# Patient Record
Sex: Male | Born: 1960
Health system: Southern US, Community
[De-identification: ages and names within clinical notes are randomized; demographics above are authoritative.]

## PROBLEM LIST (undated history)

## (undated) DIAGNOSIS — G8929 Other chronic pain: Secondary | ICD-10-CM

## (undated) DIAGNOSIS — I82409 Acute embolism and thrombosis of unspecified deep veins of unspecified lower extremity: Secondary | ICD-10-CM

## (undated) DIAGNOSIS — R109 Unspecified abdominal pain: Secondary | ICD-10-CM

## (undated) DIAGNOSIS — F329 Major depressive disorder, single episode, unspecified: Secondary | ICD-10-CM

## (undated) DIAGNOSIS — G4700 Insomnia, unspecified: Secondary | ICD-10-CM

## (undated) DIAGNOSIS — L02415 Cutaneous abscess of right lower limb: Secondary | ICD-10-CM

## (undated) DIAGNOSIS — K219 Gastro-esophageal reflux disease without esophagitis: Secondary | ICD-10-CM

## (undated) DIAGNOSIS — R7401 Elevation of levels of liver transaminase levels: Secondary | ICD-10-CM

## (undated) DIAGNOSIS — R269 Unspecified abnormalities of gait and mobility: Secondary | ICD-10-CM

## (undated) DIAGNOSIS — G35 Multiple sclerosis: Secondary | ICD-10-CM

## (undated) DIAGNOSIS — K8020 Calculus of gallbladder without cholecystitis without obstruction: Secondary | ICD-10-CM

## (undated) DIAGNOSIS — G44009 Cluster headache syndrome, unspecified, not intractable: Secondary | ICD-10-CM

## (undated) DIAGNOSIS — M1990 Unspecified osteoarthritis, unspecified site: Secondary | ICD-10-CM

## (undated) DIAGNOSIS — G894 Chronic pain syndrome: Secondary | ICD-10-CM

## (undated) DIAGNOSIS — M549 Dorsalgia, unspecified: Secondary | ICD-10-CM

## (undated) DIAGNOSIS — H538 Other visual disturbances: Secondary | ICD-10-CM

## (undated) DIAGNOSIS — I10 Essential (primary) hypertension: Secondary | ICD-10-CM

## (undated) DIAGNOSIS — F32A Depression, unspecified: Secondary | ICD-10-CM

## (undated) DIAGNOSIS — R740 Nonspecific elevation of levels of transaminase and lactic acid dehydrogenase [LDH]: Secondary | ICD-10-CM

## (undated) DIAGNOSIS — J189 Pneumonia, unspecified organism: Secondary | ICD-10-CM

## (undated) DIAGNOSIS — R51 Headache: Secondary | ICD-10-CM

## (undated) DIAGNOSIS — M531 Cervicobrachial syndrome: Secondary | ICD-10-CM

## (undated) DIAGNOSIS — L03115 Cellulitis of right lower limb: Secondary | ICD-10-CM

## (undated) DIAGNOSIS — H8110 Benign paroxysmal vertigo, unspecified ear: Secondary | ICD-10-CM

## (undated) DIAGNOSIS — M2550 Pain in unspecified joint: Secondary | ICD-10-CM

## (undated) DIAGNOSIS — R7402 Elevation of levels of lactic acid dehydrogenase (LDH): Secondary | ICD-10-CM

## (undated) DIAGNOSIS — Z9889 Other specified postprocedural states: Secondary | ICD-10-CM

## (undated) DIAGNOSIS — G500 Trigeminal neuralgia: Secondary | ICD-10-CM

## (undated) DIAGNOSIS — R413 Other amnesia: Secondary | ICD-10-CM

## (undated) DIAGNOSIS — M2540 Effusion, unspecified joint: Secondary | ICD-10-CM

## (undated) DIAGNOSIS — F419 Anxiety disorder, unspecified: Secondary | ICD-10-CM

## (undated) HISTORY — DX: Other amnesia: R41.3

## (undated) HISTORY — DX: Multiple sclerosis: G35

## (undated) HISTORY — DX: Unspecified osteoarthritis, unspecified site: M19.90

## (undated) HISTORY — DX: Other visual disturbances: H53.8

## (undated) HISTORY — DX: Anxiety disorder, unspecified: F41.9

## (undated) HISTORY — DX: Cervicobrachial syndrome: M53.1

## (undated) HISTORY — DX: Headache: R51

## (undated) HISTORY — DX: Benign paroxysmal vertigo, unspecified ear: H81.10

## (undated) HISTORY — DX: Insomnia, unspecified: G47.00

## (undated) HISTORY — DX: Essential (primary) hypertension: I10

## (undated) HISTORY — DX: Elevation of levels of liver transaminase levels: R74.01

## (undated) HISTORY — DX: Trigeminal neuralgia: G50.0

## (undated) HISTORY — DX: Major depressive disorder, single episode, unspecified: F32.9

## (undated) HISTORY — DX: Calculus of gallbladder without cholecystitis without obstruction: K80.20

## (undated) HISTORY — DX: Elevation of levels of lactic acid dehydrogenase (LDH): R74.02

## (undated) HISTORY — DX: Chronic pain syndrome: G89.4

## (undated) HISTORY — DX: Unspecified abnormalities of gait and mobility: R26.9

## (undated) HISTORY — DX: Depression, unspecified: F32.A

## (undated) HISTORY — DX: Unspecified abdominal pain: R10.9

## (undated) HISTORY — DX: Nonspecific elevation of levels of transaminase and lactic acid dehydrogenase (ldh): R74.0

---

## 1998-05-02 ENCOUNTER — Emergency Department (HOSPITAL_COMMUNITY): Admission: EM | Admit: 1998-05-02 | Discharge: 1998-05-02 | Payer: Self-pay | Admitting: Emergency Medicine

## 2000-07-29 ENCOUNTER — Emergency Department (HOSPITAL_COMMUNITY): Admission: EM | Admit: 2000-07-29 | Discharge: 2000-07-29 | Payer: Self-pay | Admitting: Neurology

## 2000-08-31 ENCOUNTER — Ambulatory Visit (HOSPITAL_BASED_OUTPATIENT_CLINIC_OR_DEPARTMENT_OTHER): Admission: RE | Admit: 2000-08-31 | Discharge: 2000-08-31 | Payer: Self-pay | Admitting: Orthopedic Surgery

## 2002-05-21 ENCOUNTER — Emergency Department (HOSPITAL_COMMUNITY): Admission: EM | Admit: 2002-05-21 | Discharge: 2002-05-21 | Payer: Self-pay | Admitting: Emergency Medicine

## 2003-08-17 ENCOUNTER — Emergency Department (HOSPITAL_COMMUNITY): Admission: EM | Admit: 2003-08-17 | Discharge: 2003-08-17 | Payer: Self-pay | Admitting: Emergency Medicine

## 2004-01-19 ENCOUNTER — Ambulatory Visit (HOSPITAL_COMMUNITY): Admission: RE | Admit: 2004-01-19 | Discharge: 2004-01-19 | Payer: Self-pay | Admitting: Neurology

## 2004-02-20 ENCOUNTER — Ambulatory Visit (HOSPITAL_COMMUNITY): Admission: RE | Admit: 2004-02-20 | Discharge: 2004-02-20 | Payer: Self-pay | Admitting: Neurology

## 2004-07-27 ENCOUNTER — Inpatient Hospital Stay (HOSPITAL_COMMUNITY): Admission: AD | Admit: 2004-07-27 | Discharge: 2004-08-02 | Payer: Self-pay | Admitting: Pediatrics

## 2004-07-27 ENCOUNTER — Ambulatory Visit: Payer: Self-pay | Admitting: Physical Medicine & Rehabilitation

## 2004-08-02 ENCOUNTER — Ambulatory Visit: Payer: Self-pay | Admitting: Physical Medicine & Rehabilitation

## 2004-08-02 ENCOUNTER — Inpatient Hospital Stay (HOSPITAL_COMMUNITY)
Admission: RE | Admit: 2004-08-02 | Discharge: 2004-08-06 | Payer: Self-pay | Admitting: Physical Medicine & Rehabilitation

## 2004-08-12 ENCOUNTER — Inpatient Hospital Stay (HOSPITAL_COMMUNITY): Admission: EM | Admit: 2004-08-12 | Discharge: 2004-08-16 | Payer: Self-pay | Admitting: Emergency Medicine

## 2004-09-17 ENCOUNTER — Encounter: Admission: RE | Admit: 2004-09-17 | Discharge: 2004-11-11 | Payer: Self-pay | Admitting: Family Medicine

## 2004-10-06 ENCOUNTER — Emergency Department (HOSPITAL_COMMUNITY): Admission: EM | Admit: 2004-10-06 | Discharge: 2004-10-06 | Payer: Self-pay | Admitting: Emergency Medicine

## 2004-11-16 ENCOUNTER — Emergency Department (HOSPITAL_COMMUNITY): Admission: EM | Admit: 2004-11-16 | Discharge: 2004-11-16 | Payer: Self-pay | Admitting: Emergency Medicine

## 2004-11-21 ENCOUNTER — Ambulatory Visit (HOSPITAL_COMMUNITY): Admission: RE | Admit: 2004-11-21 | Discharge: 2004-11-21 | Payer: Self-pay | Admitting: Sports Medicine

## 2004-12-30 ENCOUNTER — Emergency Department (HOSPITAL_COMMUNITY): Admission: EM | Admit: 2004-12-30 | Discharge: 2004-12-30 | Payer: Self-pay | Admitting: *Deleted

## 2005-02-11 ENCOUNTER — Encounter: Admission: RE | Admit: 2005-02-11 | Discharge: 2005-02-11 | Payer: Self-pay | Admitting: Family Medicine

## 2005-05-16 ENCOUNTER — Encounter: Admission: RE | Admit: 2005-05-16 | Discharge: 2005-08-14 | Payer: Self-pay | Admitting: Psychiatry

## 2005-06-12 IMAGING — MR MR THORACIC SPINE WO/W CM
6 of 13 series · 18 of 48 positions shown · IV contrast (omniscan)
Comparison: none

CLINICAL DATA: Lower extremity spasticity, myelopathy.
 MRI THORACIC SPINE WITH AND WITHOUT IV CONTRAST
 Standard high field strength images were generated pre- and post-IV infusion of 20 cc Omniscan.  
 Multiple foci of abnormal cord signal intensity are noted on STIR and T2 weighted images.  There is focus of abnormal signal intensity in the ventral aspect of the cord at the inferior C6 and C6-7 levels, measuring approximately 1 cm in superior to inferior dimension and a similar appearing focus of T2 prolongation of the cord at T1 level measuring approximately 1.5 cm.  There is a smaller and more subtle focus of cord signal alteration involving the posterior aspect of the cord at the inferior T12 level, measuring approximately 3 x 5 mm.  No definite abnormal contrast enhancement of the lesions.  There is a small disc herniation centrally at C5-6.  No cord compression.
 In addition to the cord lesions described above, note that a T2 weighted sagittal image of the cervical spine also reveals abnormal lesions in the cervical cord including the C2 and C2-3 levels.  At C2, there is a lesion in the left aspect of the cord.  Inferior to C2 and C2-3 levels, there is a lesion in the more posterior aspect of the cord and not that there is also suggestion of subtle ill-defined foci of signal alteration in the cord at the C3-4 and C4-5 levels in addition to the more well-defined foci of signal alteration involving the posterior aspect of the cord at the C6 level and also a smaller lesion along the anterior aspect of the cord on the left at the inferior C6 level.  Possibly mild dilatation of central canal at the cord at C2 level.
 IMPRESSION 
 Multiple cervical and thoracic spinal cord lesions (foci of T2 prolongation), of undetermined etiology.  A demyelination process such as MS would be a leading consideration.  Consider MRI of the brain if not previously performed.

[Series 2: T1 · sagittal · 3.0mm · 0.62mm/px · 4 of 12 slices shown (1 of 2)]
[im 1/12]
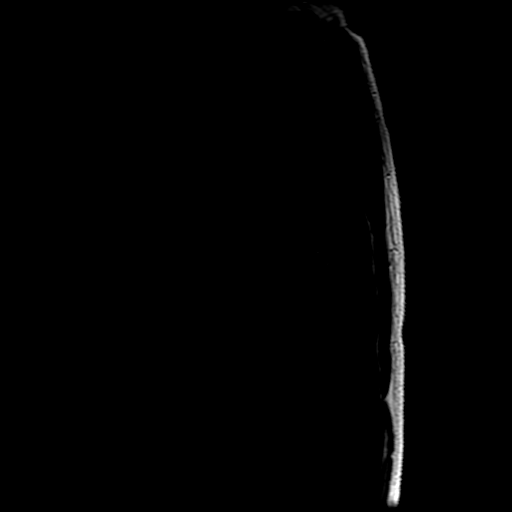
[im 4/12]
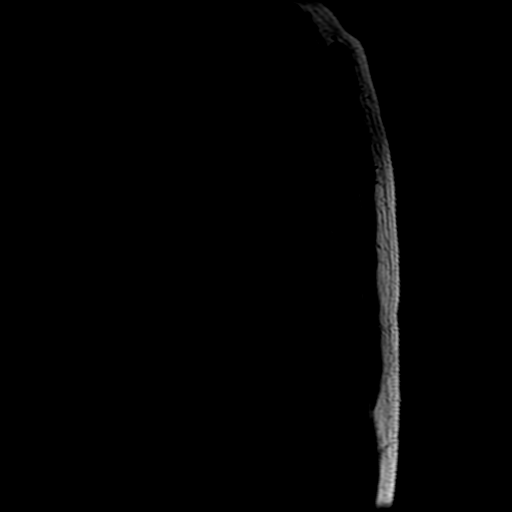
[im 8/12]
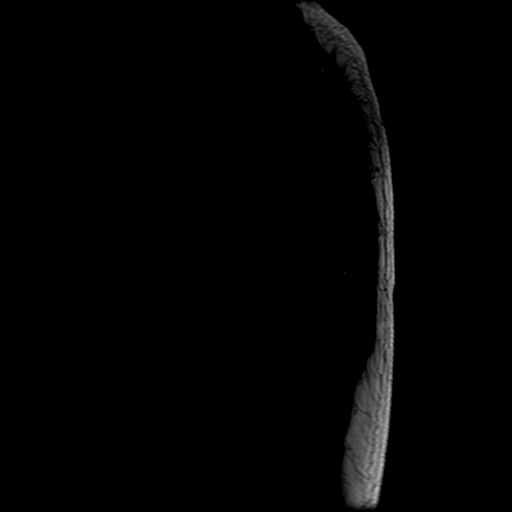
[im 12/12]
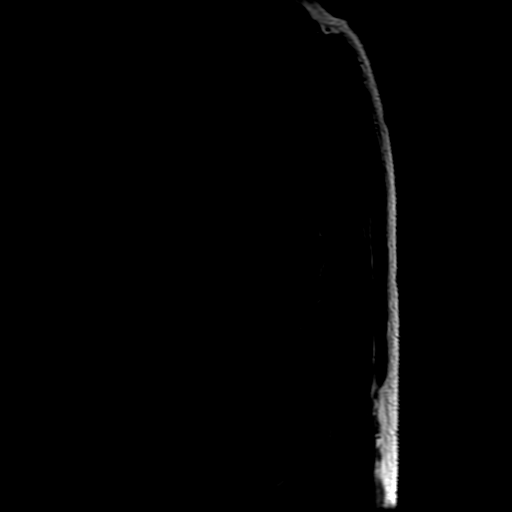

[Series 4: T2 · sagittal · 3.0mm · 0.62mm/px · 3 of 12 slices shown (1 of 2)]
[im 1/12]
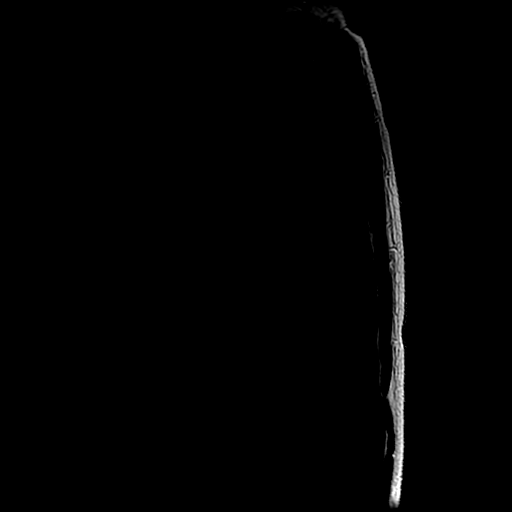
[im 6/12]
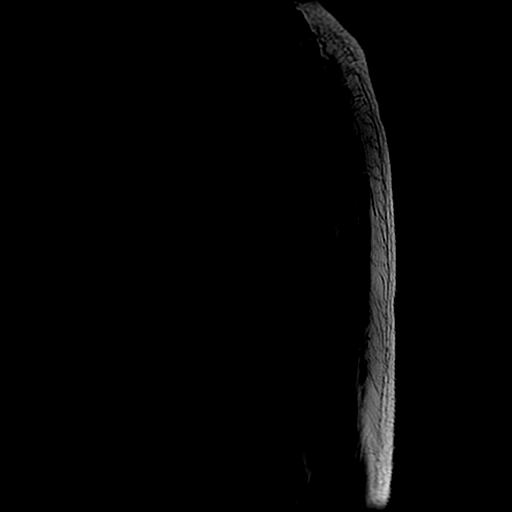
[im 12/12]
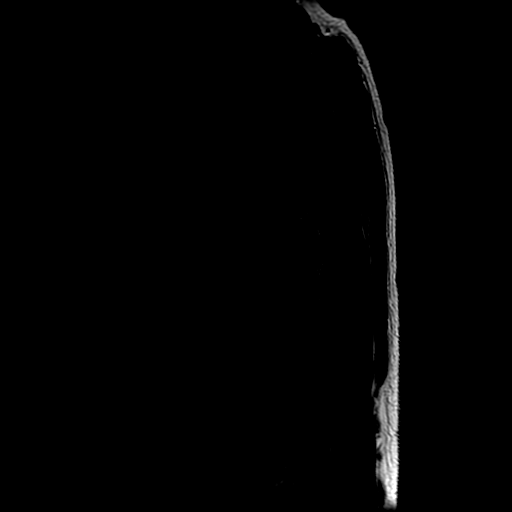

[Series 5: STIR · sagittal · 3.0mm · 0.62mm/px · 3 of 12 slices shown]
[im 1/12]
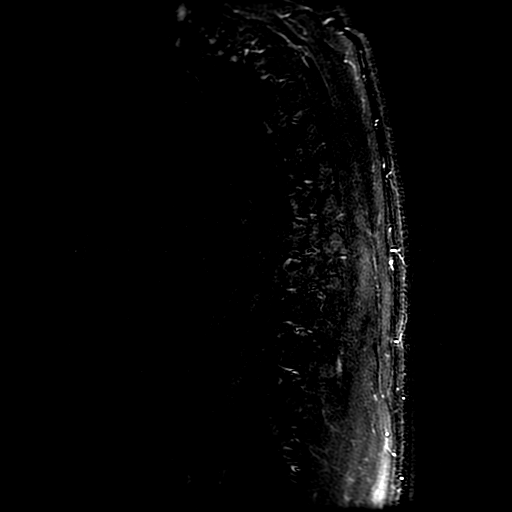
[im 6/12]
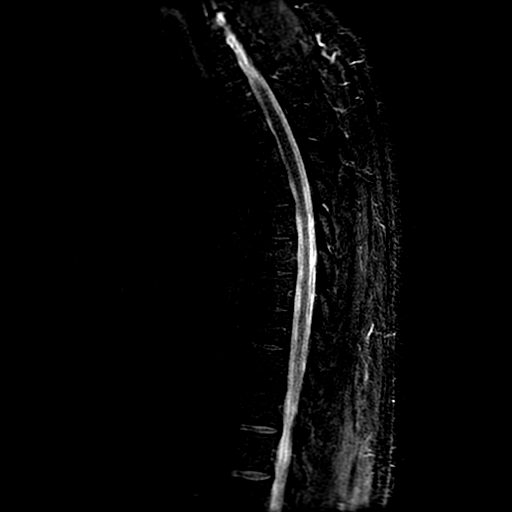
[im 12/12]
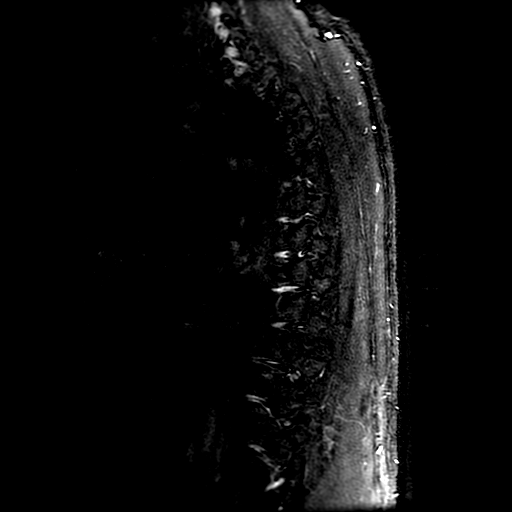

[Series 6: T1 · sagittal · 3.0mm · 0.62mm/px · 3 of 12 slices shown (2 of 2)]
[im 1/12]
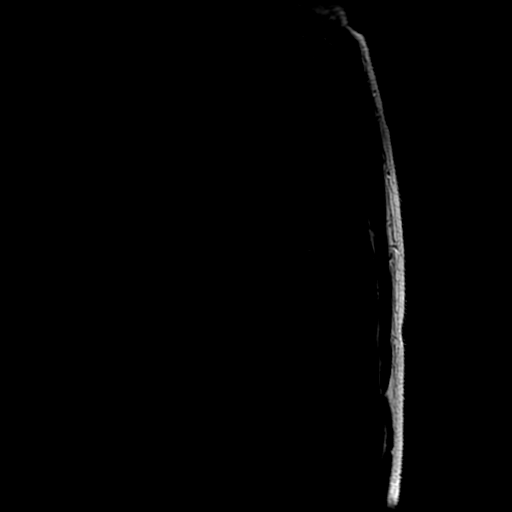
[im 6/12]
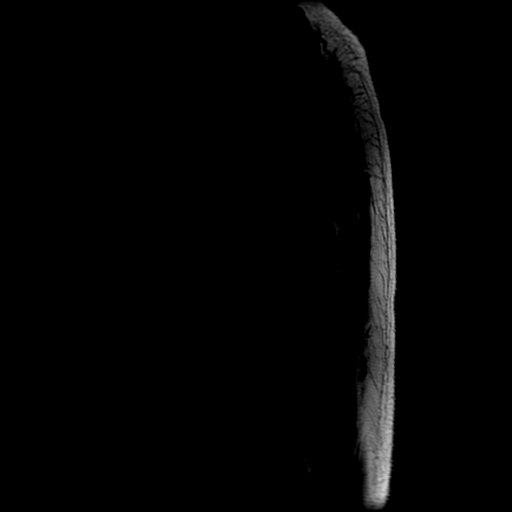
[im 12/12]
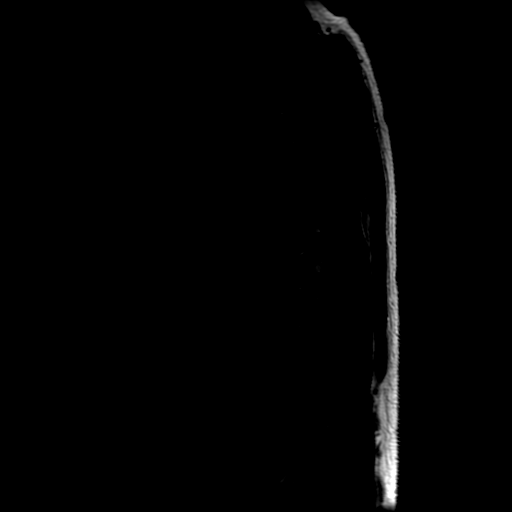

[Series 8: T2 · sagittal · 3.0mm · 0.43mm/px · 3 of 12 slices shown (2 of 2)]
[im 1/12]
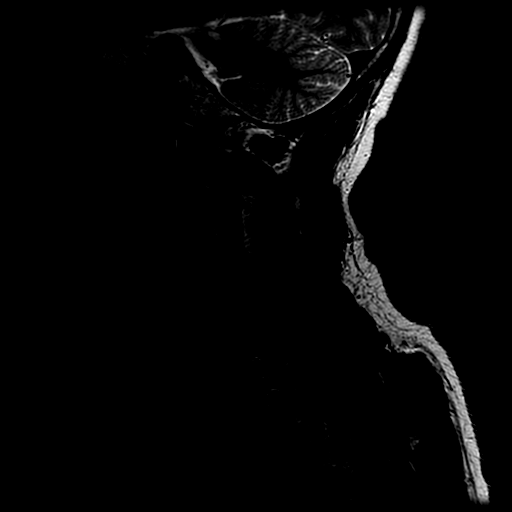
[im 6/12]
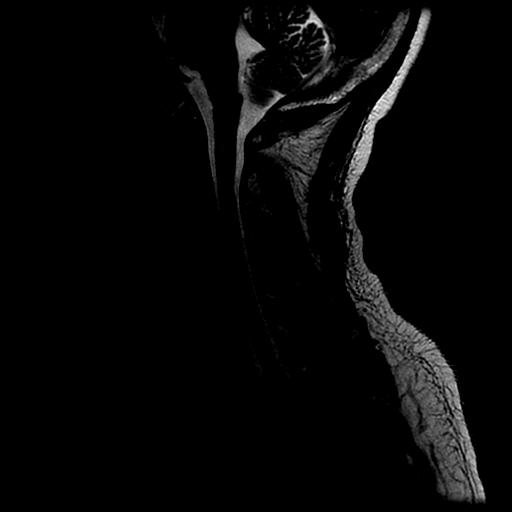
[im 12/12]
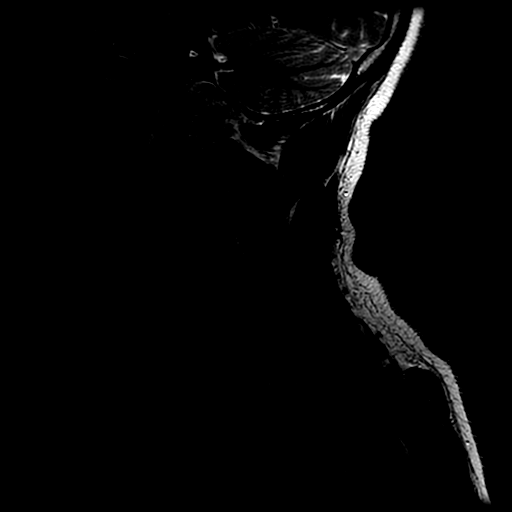

[Series 9: T1 post-contrast · sagittal · 3.0mm · 0.62mm/px · 2 of 12 slices shown]
[im 1/12]
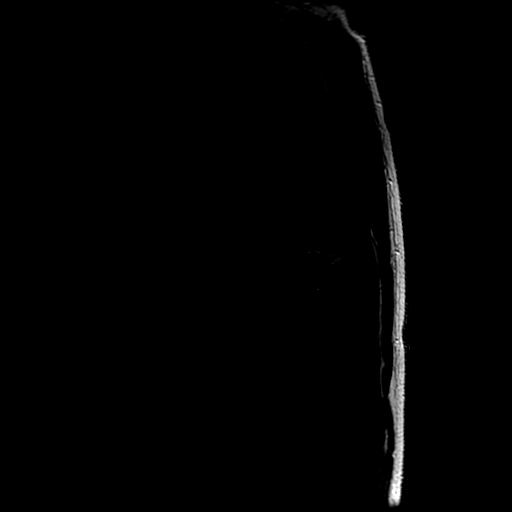
[im 6/12]
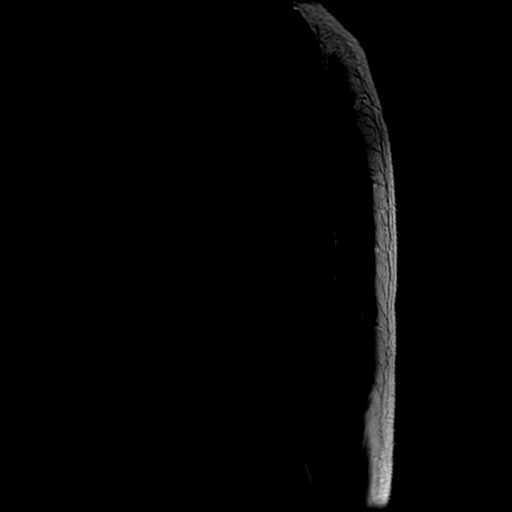

[18 of 48 positions shown; findings below may reference images not displayed]

## 2005-07-11 ENCOUNTER — Ambulatory Visit: Payer: Self-pay | Admitting: Hospitalist

## 2005-07-14 IMAGING — MR MR HEAD WO/W CM
9 of 12 series · 31 of 48 positions shown · IV contrast (omniscan)
Comparison: none

CLINICAL DATA: Spastic paraparesis; MR of the C and T-spine suggest MS. 
 MRI BRAIN WITHOUT AND WITH CONTRAST
 Multiplanar T1- and T2-weighted images are obtained before and after the administration of 20 cc Omniscan. 
 Sagittal T1-weighted images are unremarkable.
 Axial T2-weighted images demonstrate normal ventricles, cisterns and sulci.  FLAIR images demonstrate a few small foci of increased signal in the subcortical white matter bilaterally.  These do not involve the cerebellum or brainstem.  There are a few areas which could be characterized as periventricular.  
 Sagittal FLAIR images show increased signal on image 12 (midline) along the inferior aspect of the corpus callosum (callosal-septal interface).  This finding is characteristic for MS.  Axial T1-weighted images are unremarkable.  Following the administration of 20 cc Omniscan there is no abnormal intracranial enhancement.
 Diffusion images are negative for acute infarction.  There are no areas of abnormal increased signal which might suggest acute MS plaque activity. 
 IMPRESSION
 Scattered periventricular and subcortical white matter lesions on axial T2- and FLAIR images; see above comments.  
 Abnormal signal along the callosal septal interface on sagittal FLAIR images; this appearance is characteristic for MS.
 No abnormal intracranial enhancement.
 Diffusion images negative for acute MS plaque activity.

[Series 1: 3 plane loc · axial · 5.0mm · 0.94mm/px · 1 of 9 slices shown]
[im 1/9]
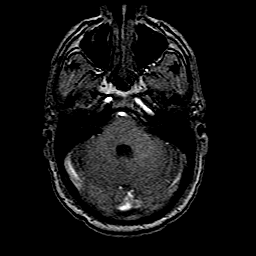

[Series 2: T1 · sagittal · 5.0mm · 0.43mm/px · 2 of 12 slices shown]
[im 1/12]
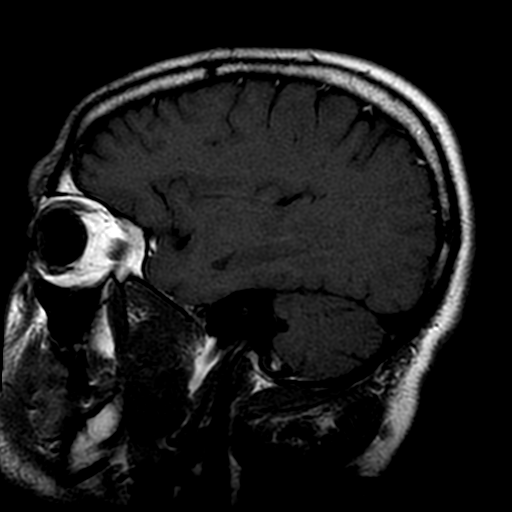
[im 12/12]
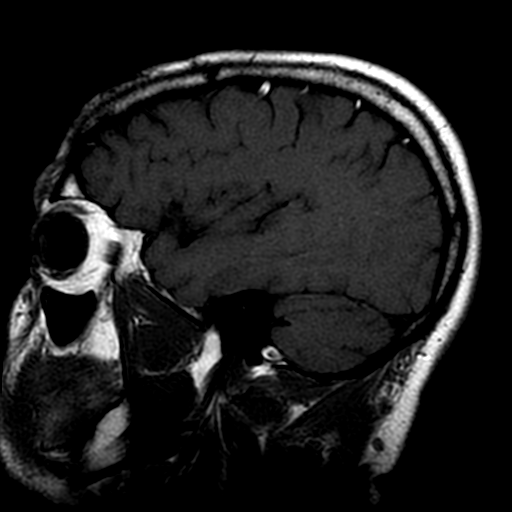

[Series 3: DWI · axial · 5.0mm · 1.41mm/px · z∈[-52,+102]mm · 8 of 58 slices shown (1 of 3)]
[im 1/58]
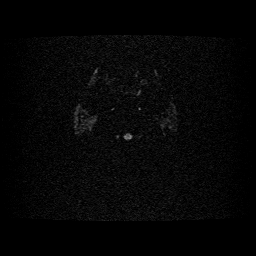
[im 9/58]
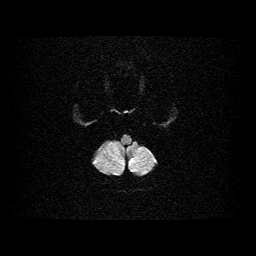
[im 17/58]
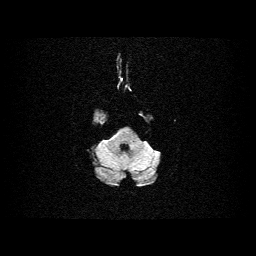
[im 25/58]
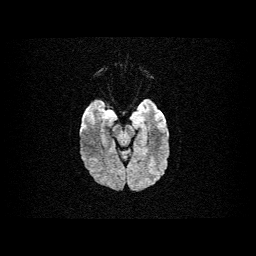
[im 33/58]
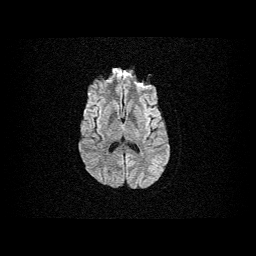
[im 41/58]
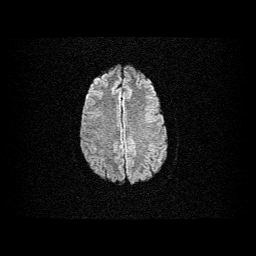
[im 49/58]
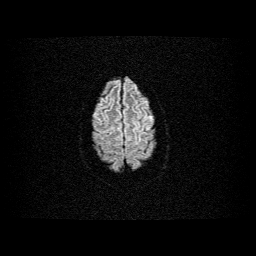
[im 58/58]
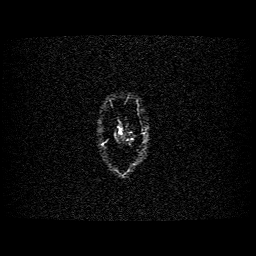

[Series 4: T2 · axial · 5.0mm · 0.43mm/px · z∈[-50,+97]mm · 3 of 22 slices shown (1 of 2)]
[im 1/22]
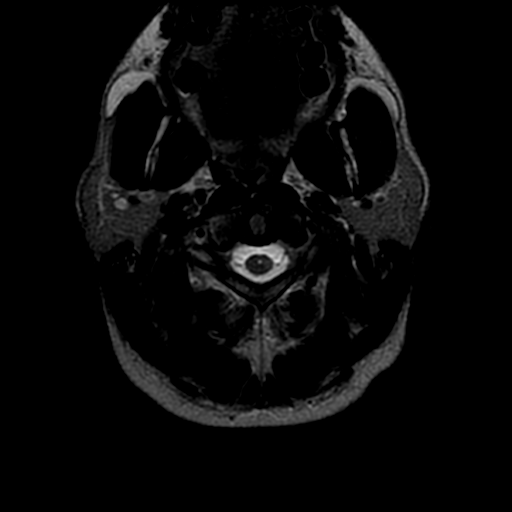
[im 11/22]
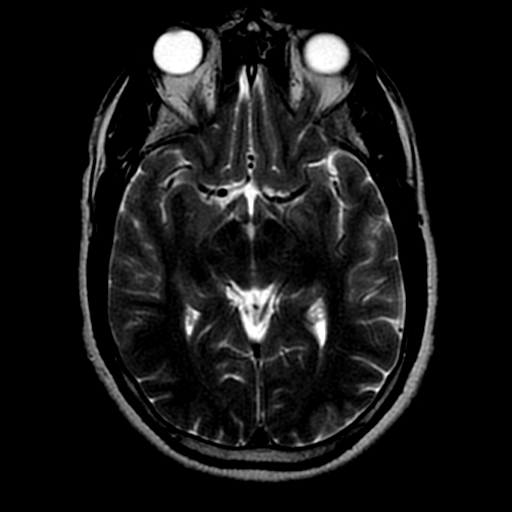
[im 22/22]
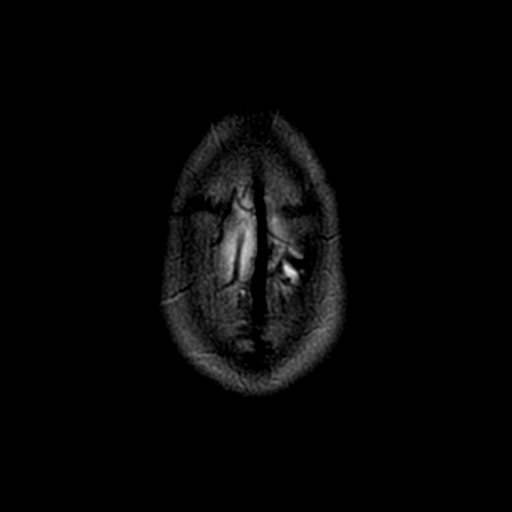

[Series 5: FLAIR · sagittal · 4.0mm · 0.43mm/px · 3 of 24 slices shown (1 of 2)]
[im 1/24]
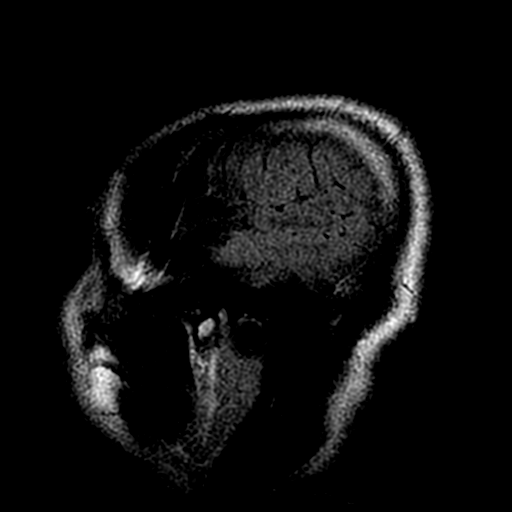
[im 12/24]
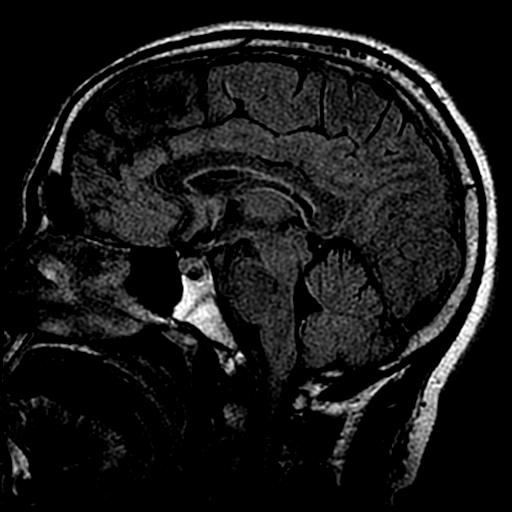
[im 24/24]
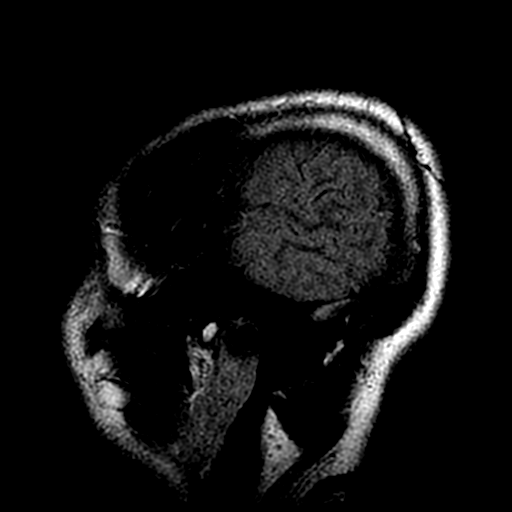

[Series 6: FLAIR · axial · 5.0mm · 0.43mm/px · z∈[-50,+97]mm · 3 of 22 slices shown (2 of 2)]
[im 1/22]
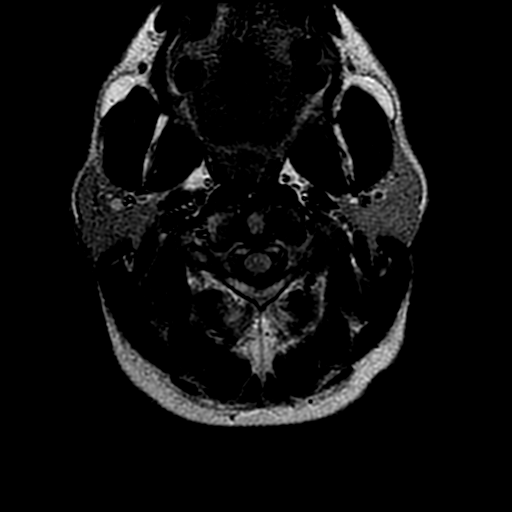
[im 11/22]
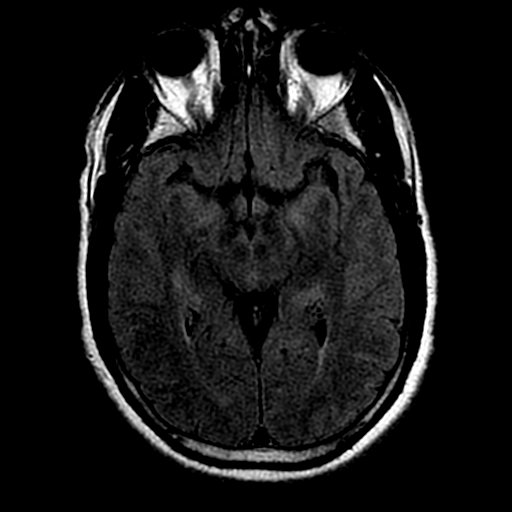
[im 22/22]
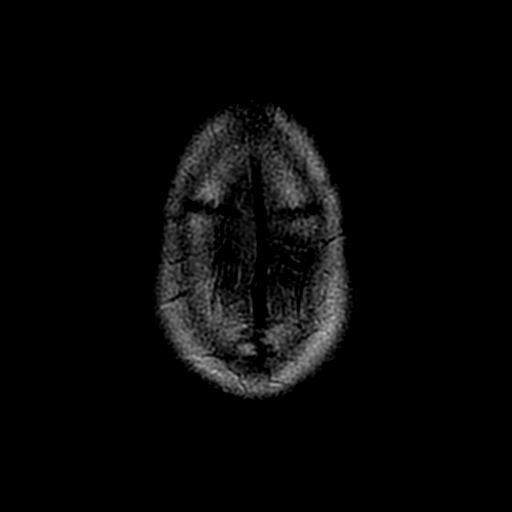

[Series 8: T2 · coronal · 5.0mm · 0.43mm/px · 3 of 24 slices shown (2 of 2)]
[im 1/24]
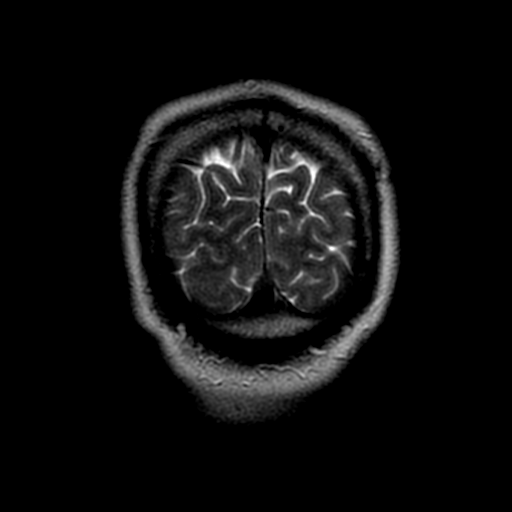
[im 12/24]
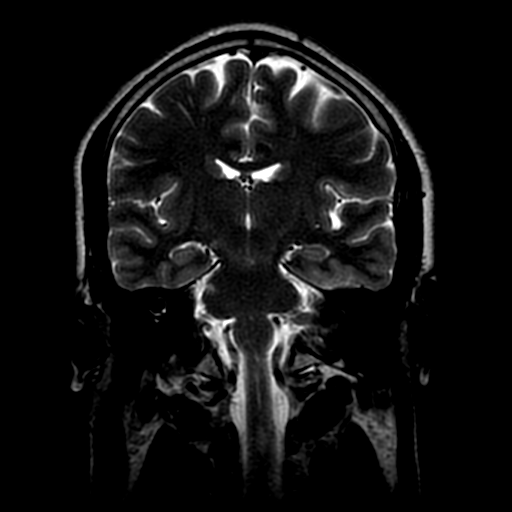
[im 24/24]
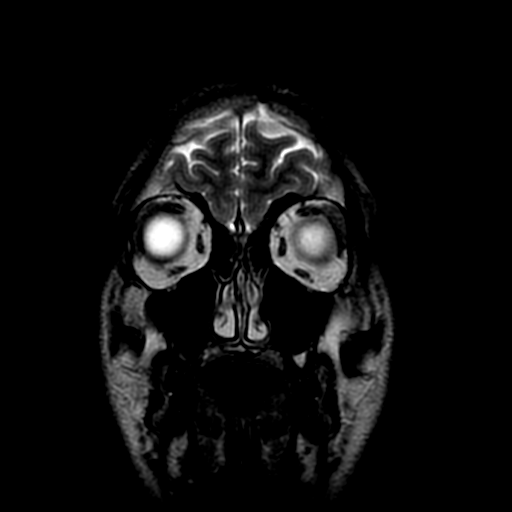

[Series 300: DWI · axial · 5.0mm · 1.41mm/px · z∈[-52,+102]mm · 4 of 29 slices shown (2 of 3)]
[im 1/29]
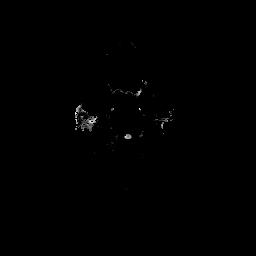
[im 10/29]
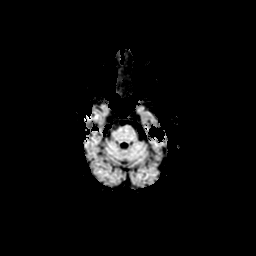
[im 19/29]
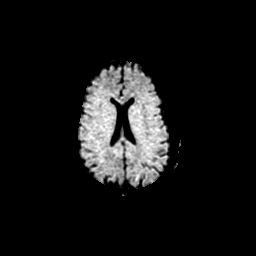
[im 29/29]
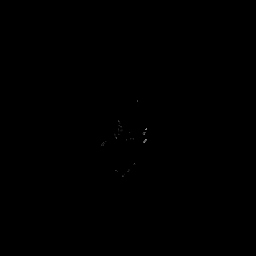

[Series 301: DWI · axial · 5.0mm · 1.41mm/px · z∈[-52,+102]mm · 4 of 29 slices shown (3 of 3)]
[im 1/29]
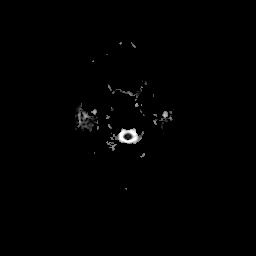
[im 10/29]
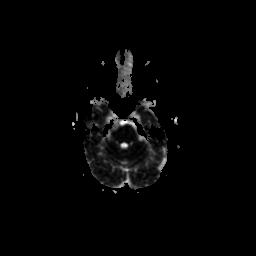
[im 19/29]
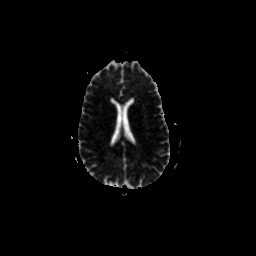
[im 29/29]
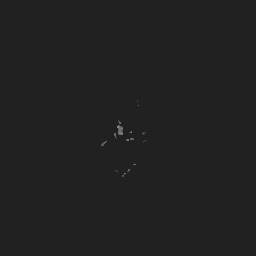

[31 of 48 positions shown; findings below may reference images not displayed]

## 2005-07-18 ENCOUNTER — Ambulatory Visit: Payer: Self-pay | Admitting: Internal Medicine

## 2005-09-10 ENCOUNTER — Emergency Department (HOSPITAL_COMMUNITY): Admission: EM | Admit: 2005-09-10 | Discharge: 2005-09-10 | Payer: Self-pay | Admitting: Emergency Medicine

## 2005-12-13 ENCOUNTER — Inpatient Hospital Stay (HOSPITAL_COMMUNITY): Admission: EM | Admit: 2005-12-13 | Discharge: 2005-12-17 | Payer: Self-pay | Admitting: Emergency Medicine

## 2005-12-13 ENCOUNTER — Ambulatory Visit: Payer: Self-pay | Admitting: Internal Medicine

## 2005-12-16 ENCOUNTER — Ambulatory Visit: Payer: Self-pay | Admitting: Physical Medicine & Rehabilitation

## 2005-12-17 ENCOUNTER — Inpatient Hospital Stay (HOSPITAL_COMMUNITY)
Admission: RE | Admit: 2005-12-17 | Discharge: 2005-12-23 | Payer: Self-pay | Admitting: Physical Medicine & Rehabilitation

## 2005-12-20 IMAGING — MR MR HEAD WO/W CM
15 of 33 series · 20 of 48 positions shown · IV contrast (omniscan)
Comparison: MRI 01/19/2004.

CLINICAL DATA: Follow-up MS, gait disorder.  
MRI BRAIN WITHOUT AND WITH CONTRAST
20 cc IV Omniscan.  Comparison MRI 02/20/2004. 
There are a few small scattered white matter lesions best seen on FLAIR.   These are present in the frontal white matter bilaterally and in the right periventricular white matter.  As noted previously there is some hyperintensity at the callosal-septal interface which is suggestive of MS.  No new lesions are identified.  The enhancement pattern is normal and there are no enhancing lesions.  
IMPRESSION
No significant change from the prior MRI.  Scattered small white matter lesions are present which are nonspecific.  There is some hyperintensity of the callosal-septal interface which suggest MS.  No enhancing lesions are present.  
MRI CERVICAL SPINE WITHOUT AND WITH CONTRAST
No comparison.
Multiple cord lesions are identified which are hyperintense on T2.  There is a relatively large central lesion at the C2-3 level.  There is a lesion on the left at C3 and C4 and another lesion on the left at the C5-level.   There is a right-sided lesion at C6 and a right-sided lesion at the T1-T2 level.  These lesions are compatible with MS of the cord.  None of them enhance. 
Cervical disk degeneration and bulges are present at C3-4, C4-5, C5-6 without spinal stenosis. 
Multiple nonenhancing cord lesions compatible with MS of the cervical cord. 
MRI OF THE THORACIC SPINE WITHOUT AND WITH CONTRAST

[Series 2: T2 · axial · 5.0mm · 0.43mm/px · 1 of 24 slices shown (1 of 5)]
[im 1/24]
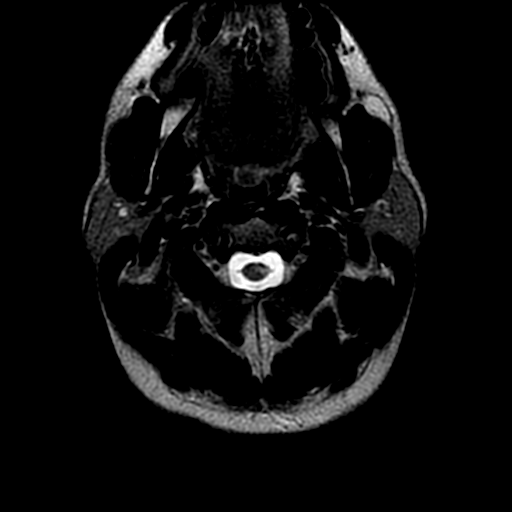

[Series 3: DWI · axial · 5.0mm · 1.41mm/px · z∈[-70,+95]mm · 2 of 62 slices shown]
[im 1/62]
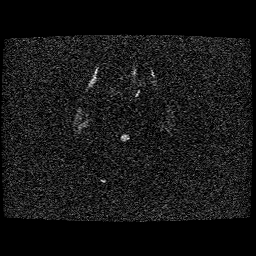
[im 62/62]
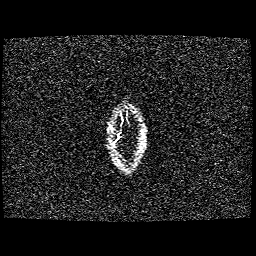

[Series 4: FLAIR · axial · 5.0mm · 0.43mm/px · 1 of 24 slices shown (1 of 3)]
[im 1/24]
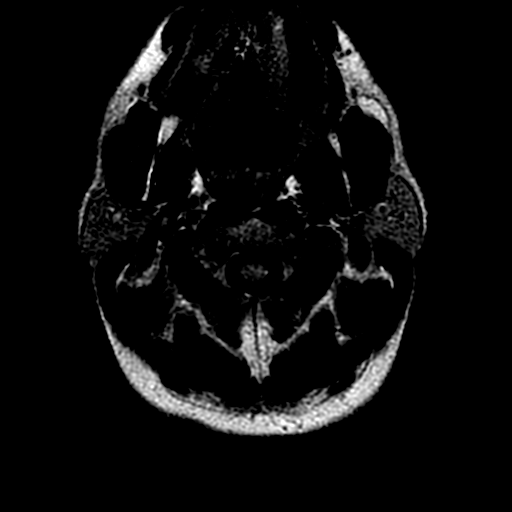

[Series 6: T1 · sagittal · 5.0mm · 0.43mm/px · 1 of 24 slices shown (1 of 5)]
[im 1/24]
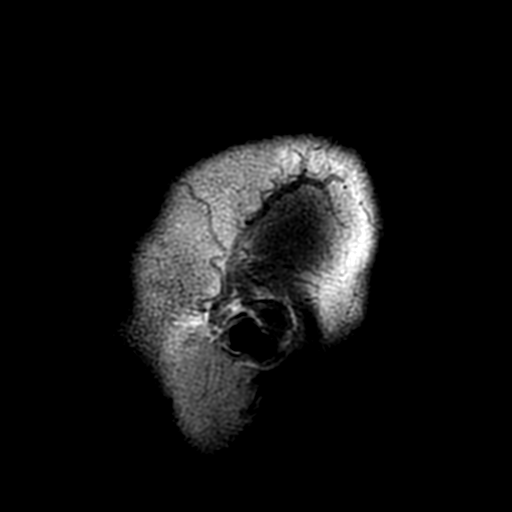

[Series 7: T2 · sagittal · 5.0mm · 0.43mm/px · 1 of 24 slices shown (2 of 5)]
[im 1/24]
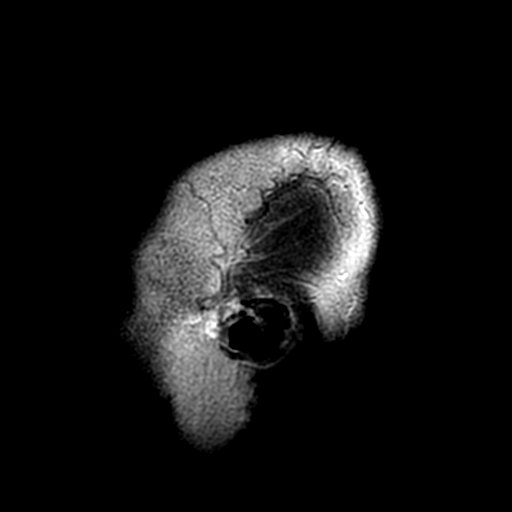

[Series 8: FLAIR · coronal · 5.0mm · 0.43mm/px · 1 of 26 slices shown (2 of 3)]
[im 1/26]
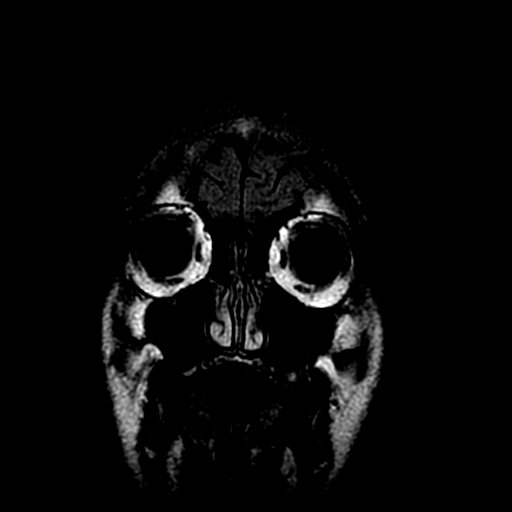

[Series 10: FLAIR · sagittal · 3.0mm · 0.43mm/px · 1 of 12 slices shown (3 of 3)]
[im 1/12]
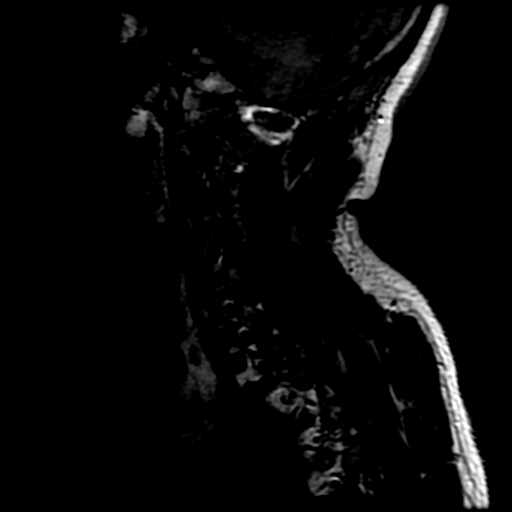

[Series 11: T2 · sagittal · 3.0mm · 0.43mm/px · 1 of 12 slices shown (3 of 5)]
[im 1/12]
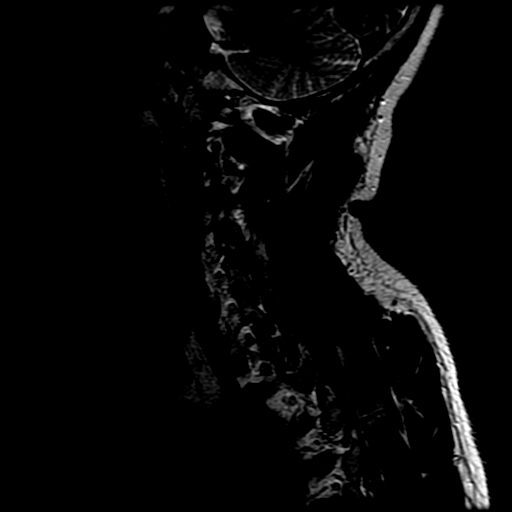

[Series 15: T2 · axial · 3.0mm · 0.39mm/px · z∈[-74,+62]mm · 2 of 35 slices shown (4 of 5)]
[im 1/35]
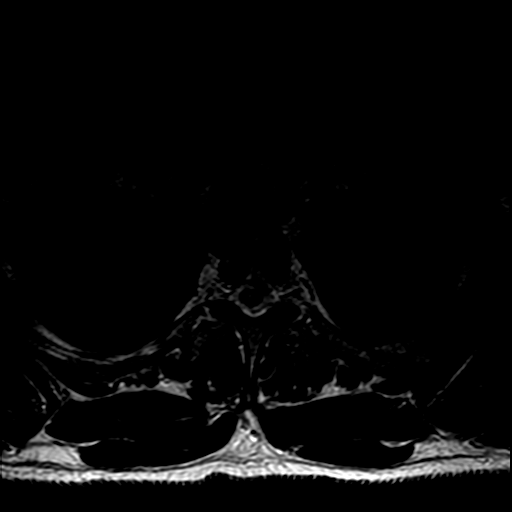
[im 35/35]
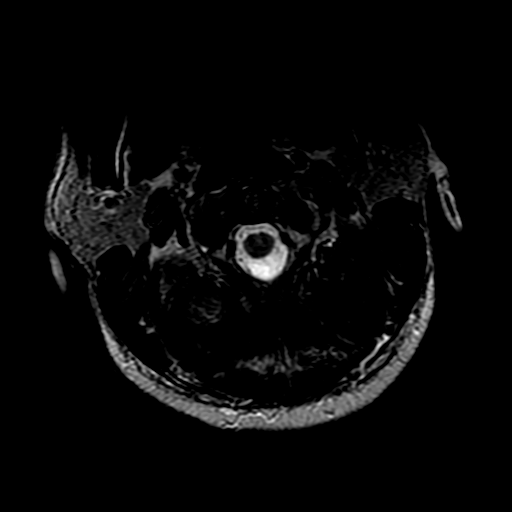

[Series 16: T1 · axial · 3.0mm · 0.39mm/px · z∈[-74,+62]mm · 2 of 35 slices shown (2 of 5)]
[im 1/35]
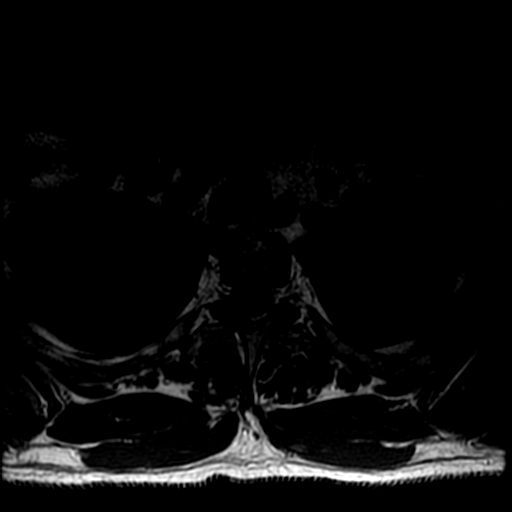
[im 35/35]
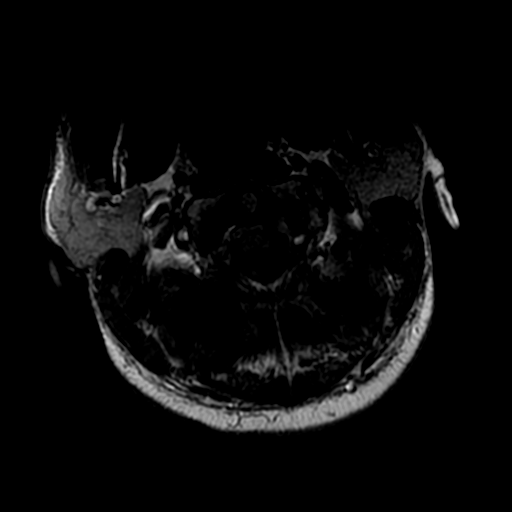

[Series 19: T1 · sagittal · non-contrast · 3.0mm · 0.70mm/px · 1 of 14 slices shown (3 of 5)]
[im 1/14]
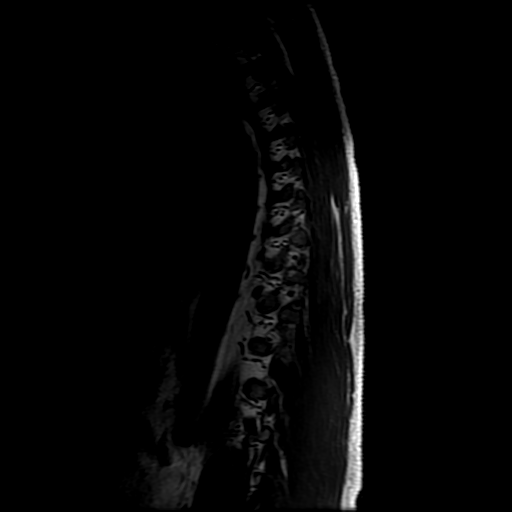

[Series 21: T2 · sagittal · 3.0mm · 0.70mm/px · 1 of 13 slices shown (5 of 5)]
[im 1/13]
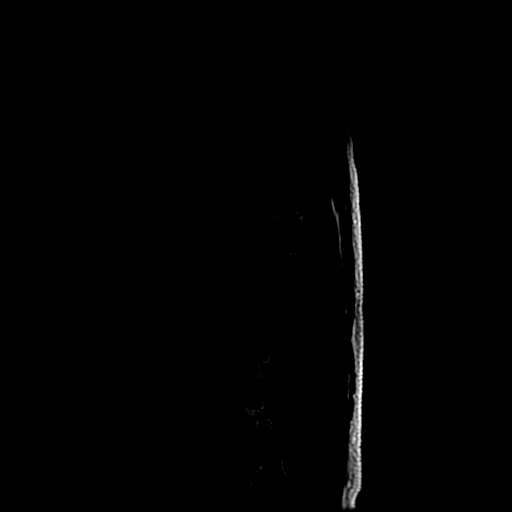

[Series 24: T1 · axial · non-contrast · 5.0mm · 0.35mm/px · z∈[-312,-33]mm · 2 of 36 slices shown (4 of 5)]
[im 1/36]
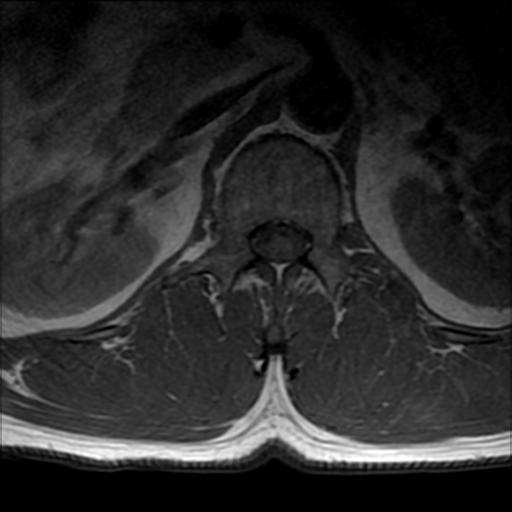
[im 36/36]
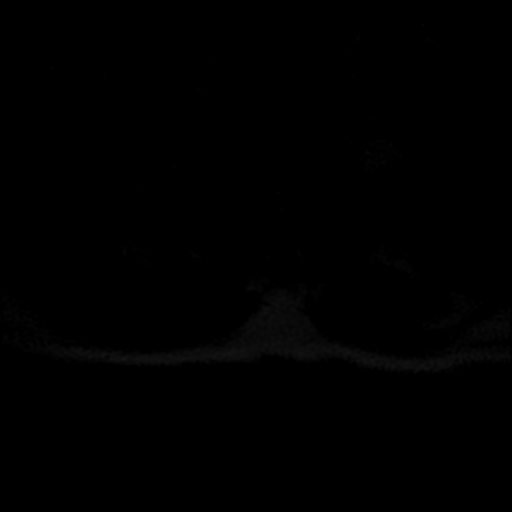

[Series 29: T1 · axial · 3.0mm · 0.39mm/px · z∈[-74,+62]mm · 2 of 35 slices shown (5 of 5)]
[im 1/35]
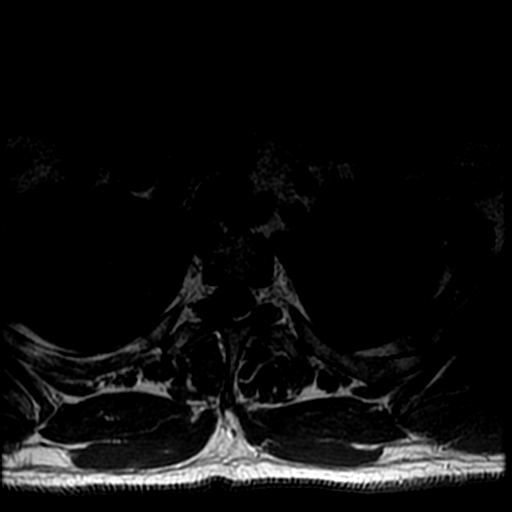
[im 35/35]
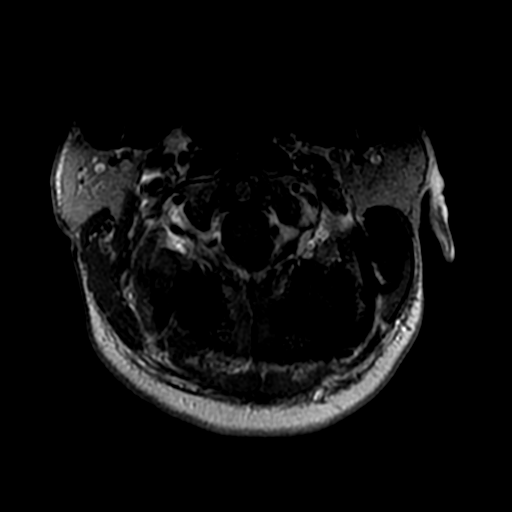

[Series 30: T1 post-contrast · sagittal · 3.0mm · 0.43mm/px · 1 of 12 slices shown]
[im 1/12]
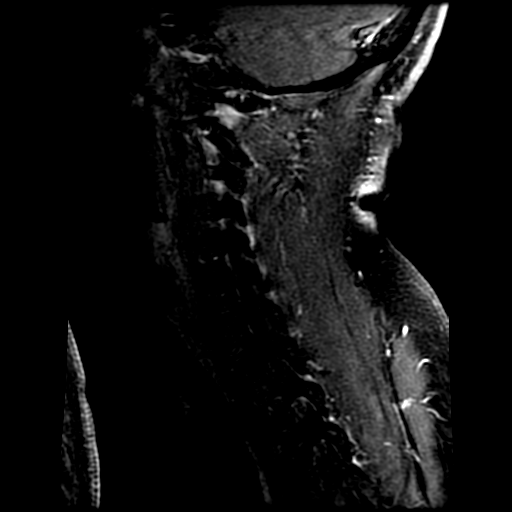

[20 of 48 positions shown; findings below may reference images not displayed]

The cervical and upper thoracic spinal cord lesions are better described on the cervical MRI report.  There is a small cord lesion dorsally at the T12 level which is unchanged.   No other cord lesions are identified.  There is no disk protrusion or spinal stenosis.  There is no fracture or mass.   Enhancement pattern is normal. 
IMPRESSION
Multiple spinal cord lesions of the cervical cord as above. There also is a lesion at T1- T2 and a small lesion at T12.  These lesions do not enhancement and are most likely due to MS.  There is no cord compression or disk protrusion.

## 2005-12-22 IMAGING — CR DG CHEST 1V PORT
1 series · 1 of 1 positions shown · non-contrast
Comparison: None.

CLINICAL DATA: MS, gait disorder; bedside PICC placement. 
 PORTABLE CHEST ONE VIEW ([DATE] HOURS)

[view not recorded]
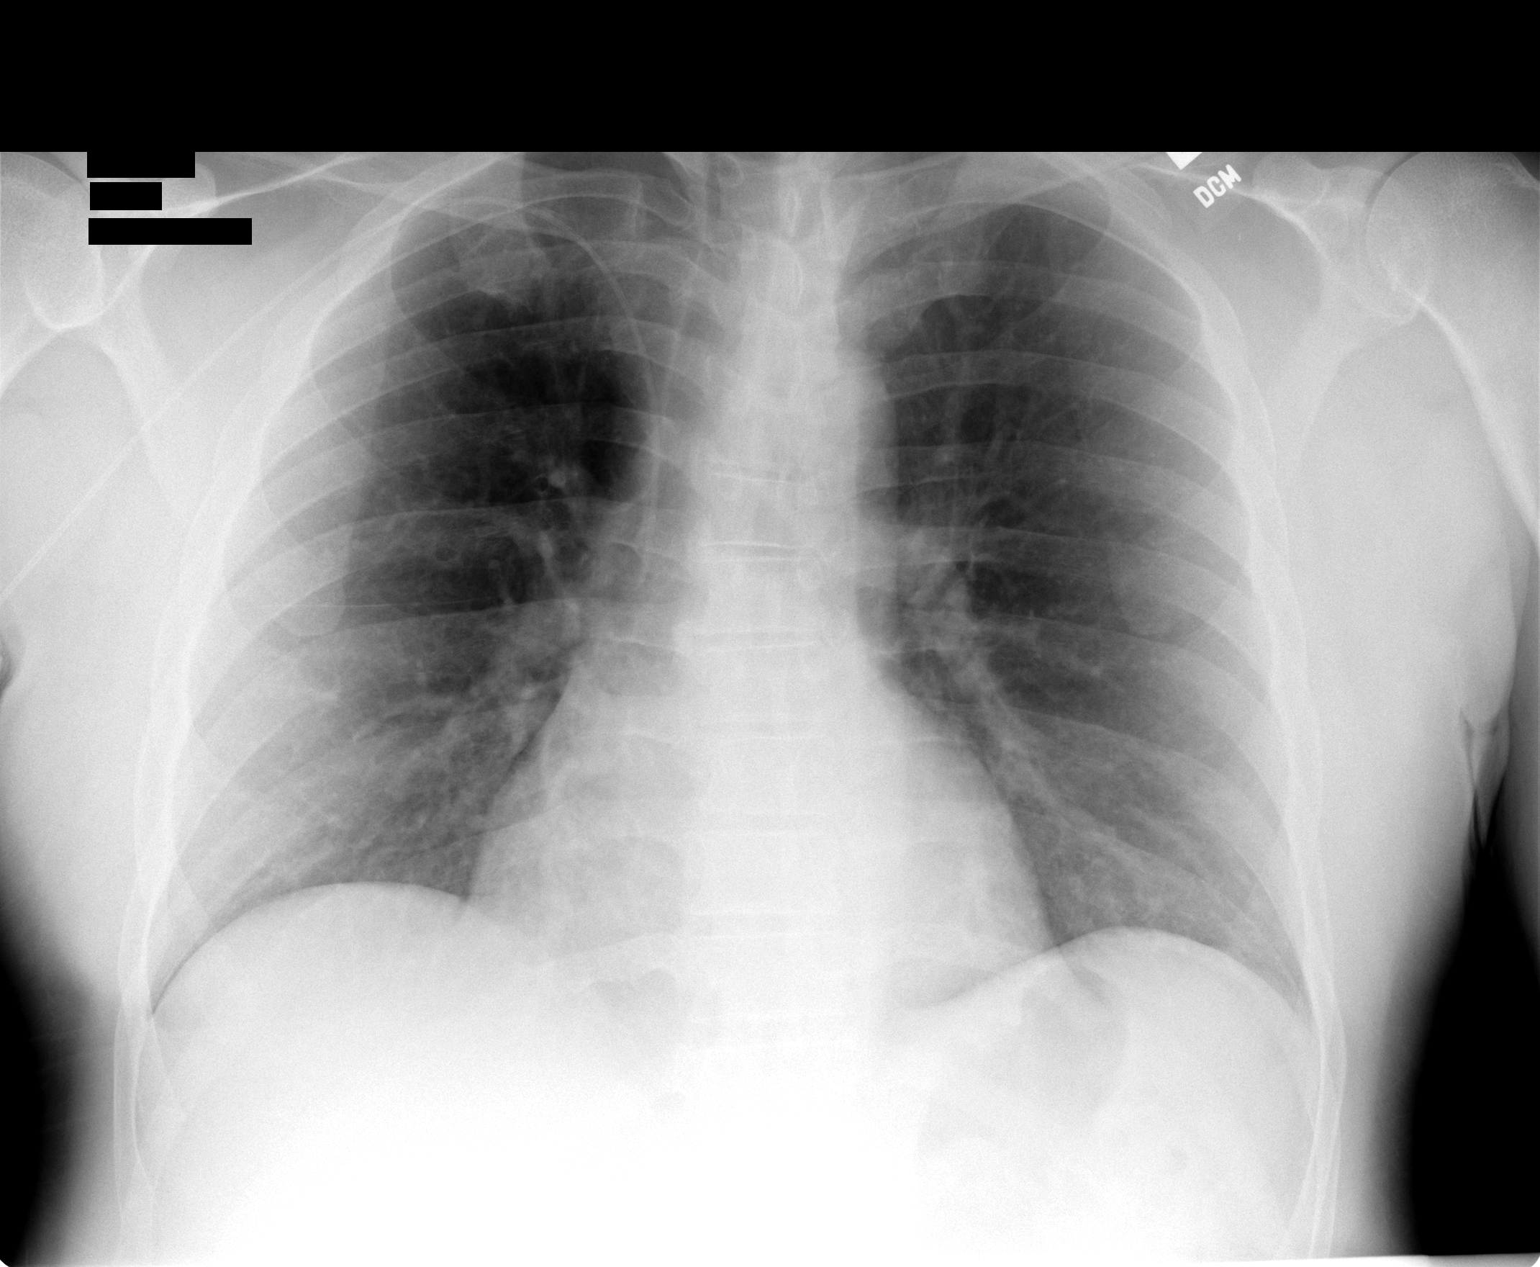

[1 of 1 positions shown; findings below may reference images not displayed]

FINDINGS: The right arm PICC tip is in the upper SVC.  The cardiomediastinal silhouette is unremarkable for the AP technique.  The lungs appear clear. 
 IMPRESSION
 Right arm PICC tip in the SVC.  No acute cardiopulmonary disease.

## 2005-12-22 IMAGING — CR DG HIP W/ PELVIS BILAT
6 series · 6 of 6 positions shown · non-contrast
Comparison: none

CLINICAL DATA: Multiple sclerosis; gait disturbance; bilateral hip pain
 BILATERAL HIPS INCLUDING AP PELVIS
 Comparison none. 
 The joint spaces in both hips are symmetric and appear well preserved.  There is no evidence of an acute or subacute fracture or dislocation.  The symphysis pubis is intact.  There are mild degenerative changes in the sacroiliac joints.  There are no other intrinsic osseous abnormalities.  
 IMPRESSION
 Normal appearing hips.  Mild degenerative changes in the sacroiliac joints.

[view not recorded (1 of 6)]
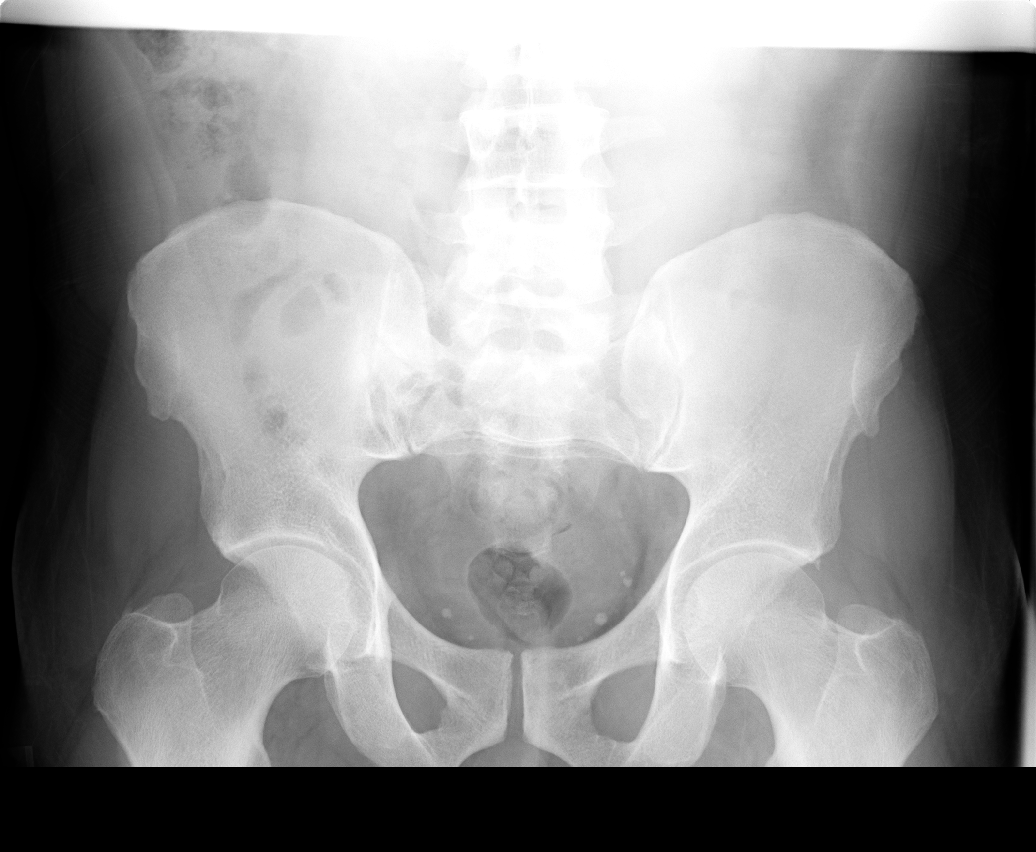

[view not recorded (2 of 6)]
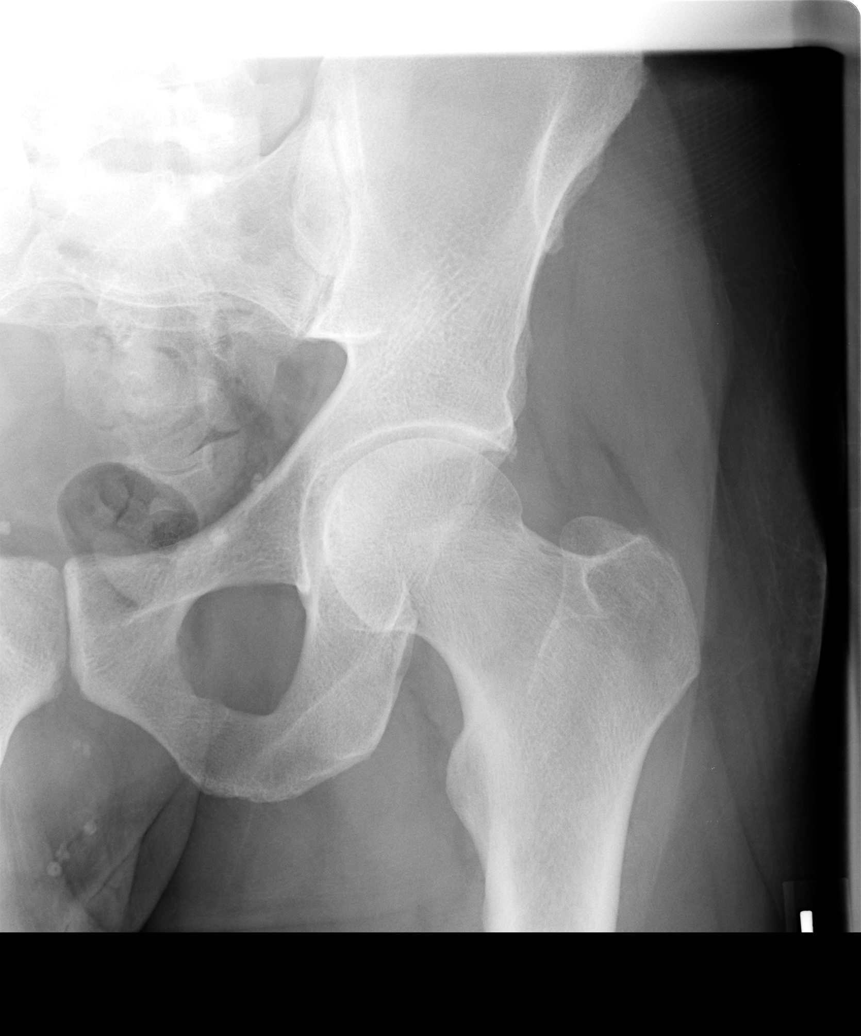

[view not recorded (3 of 6)]
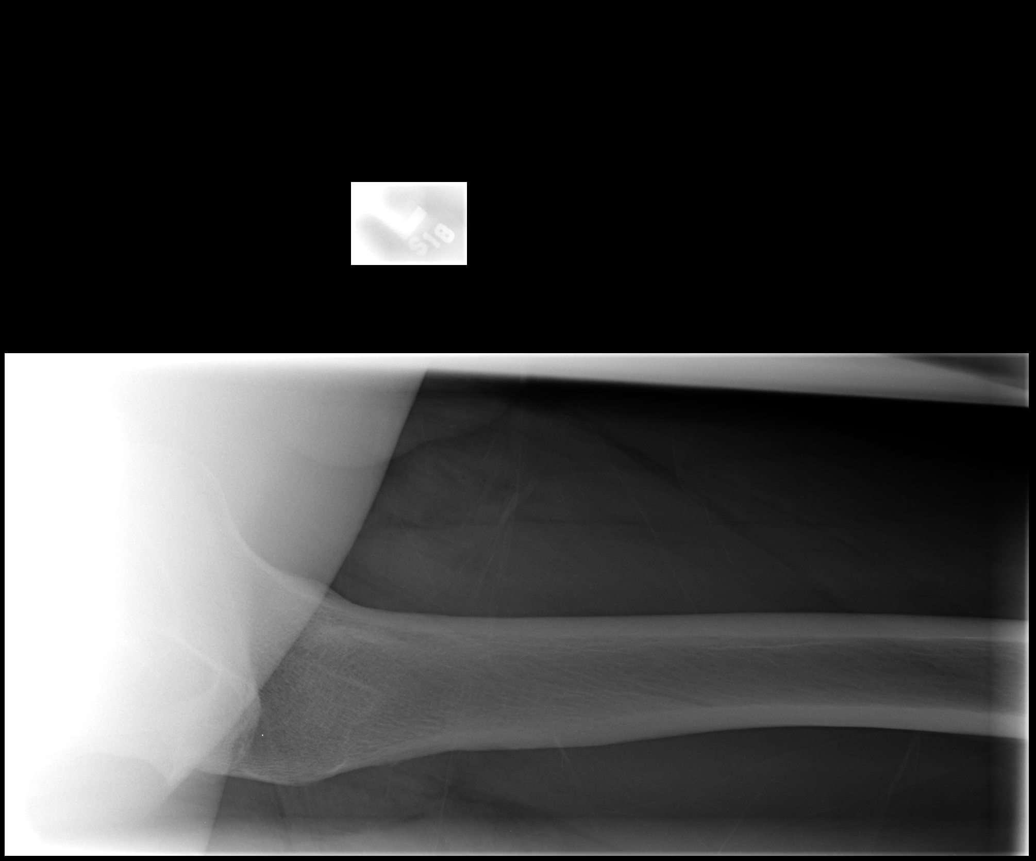

[view not recorded (4 of 6)]
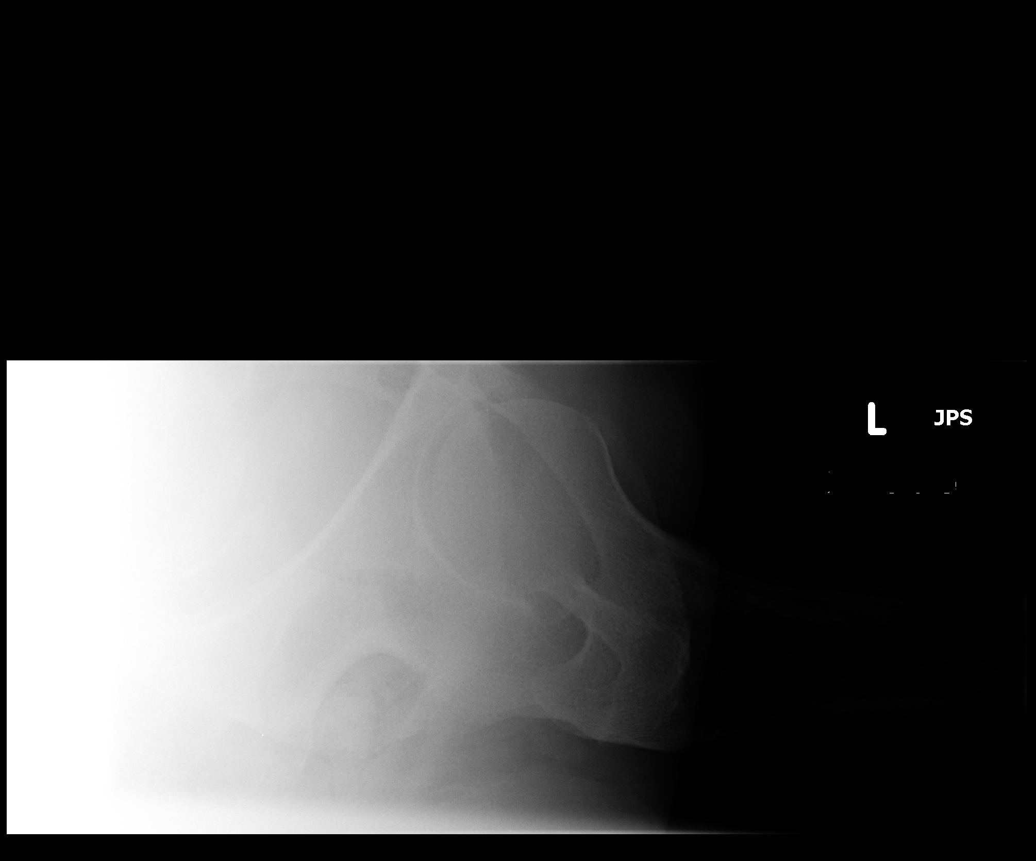

[view not recorded (5 of 6)]
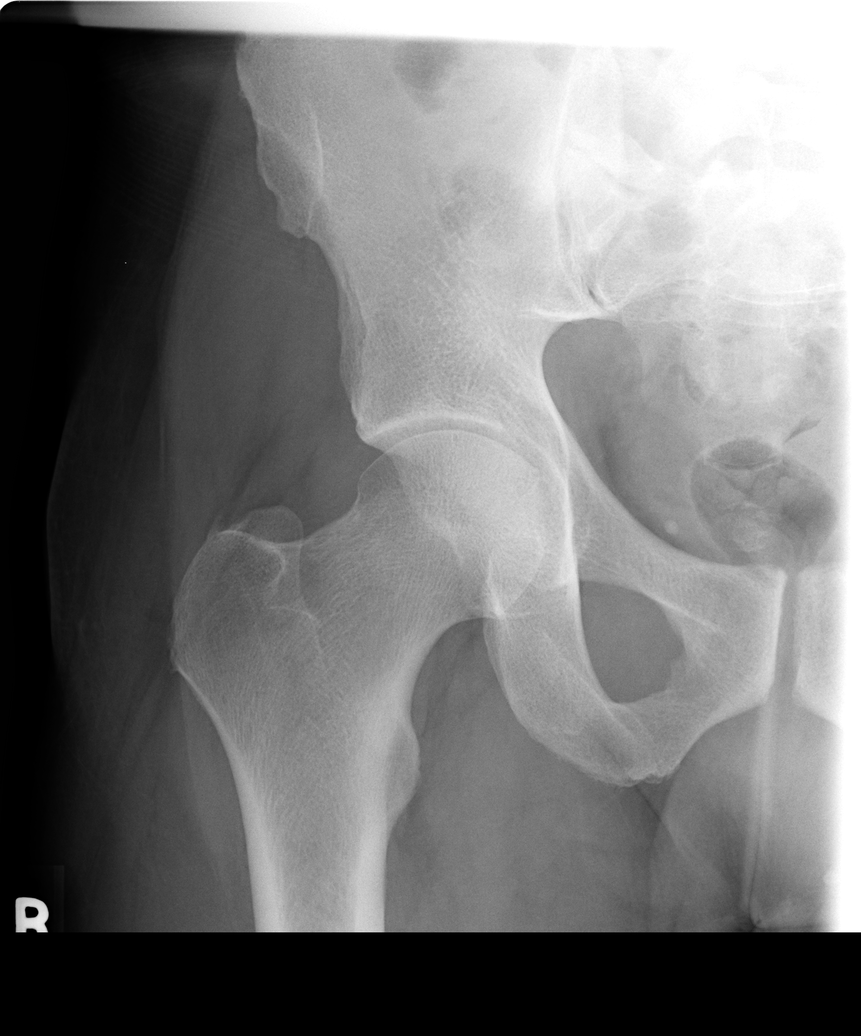

[view not recorded (6 of 6)]
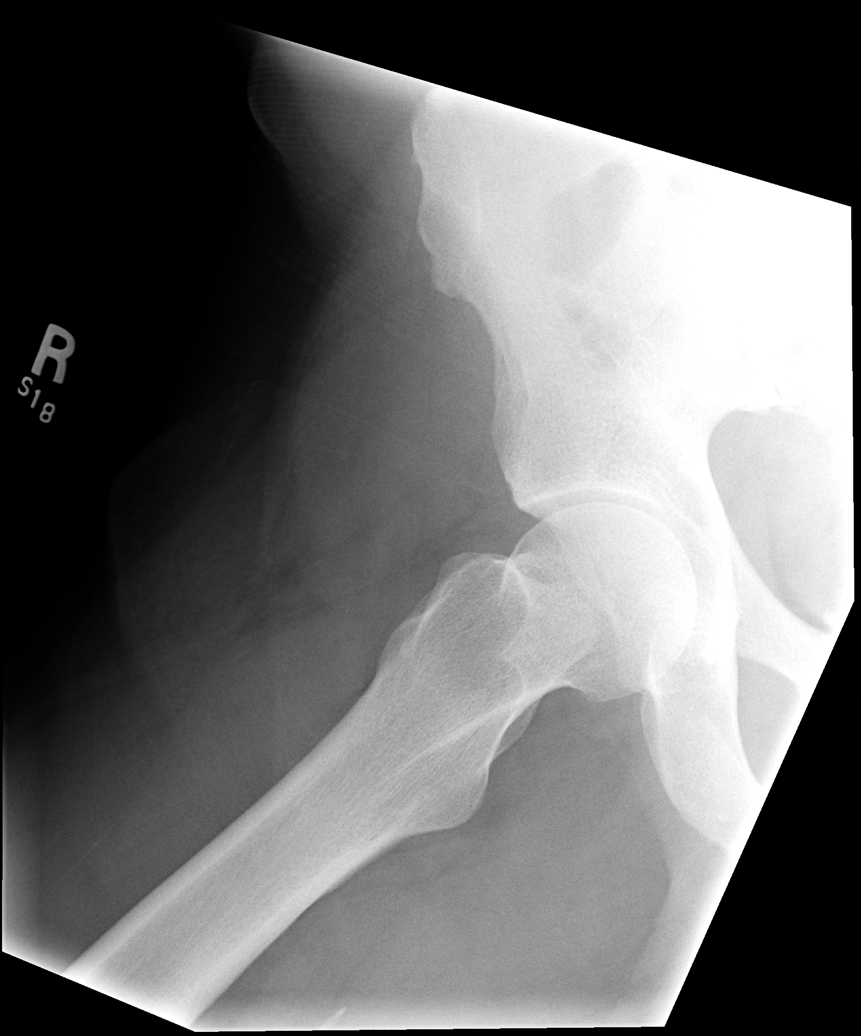

[6 of 6 positions shown; findings below may reference images not displayed]

## 2005-12-25 ENCOUNTER — Encounter
Admission: RE | Admit: 2005-12-25 | Discharge: 2006-03-25 | Payer: Self-pay | Admitting: Physical Medicine & Rehabilitation

## 2006-01-04 IMAGING — XA IR CV CATH FLUORO GUIDE
1 series · 1 of 1 positions shown · non-contrast
Comparison: none

CLINICAL DATA: Chest pain, no IV access.

PICC PLACEMENT WITH ULTRASOUND
TECHNIQUE: The right arm was prepped with Betadine, draped in the usual sterile fashion, and
infiltrated locally with 1% lidocaine.  Ultrasound demonstrated patency of the basilic vein.  Under
real-time ultrasound guidance, this vein was accessed with a 21 gauge micropuncture needle.  The
needle was exchanged over a guidewire for a peel-away sheath, through which a 5 french double lumen
PICC catheter trimmed to 33 cm was advanced, positioned with its tip at the distal SVC/right atrial
junction.   Fluoroscopic spot chest radiograph confirms appropriate catheter tip position.  The
catheter was flushed, secured to the skin with Prolene sutures, and covered with a sterile
dressing.  No immediate complication. 
IMPRESSION
Technically successful right arm PICC placement with ultrasound.  Ready for routine use.

[Series 1: run · 1 of 1 slices shown]
[im 1/1]
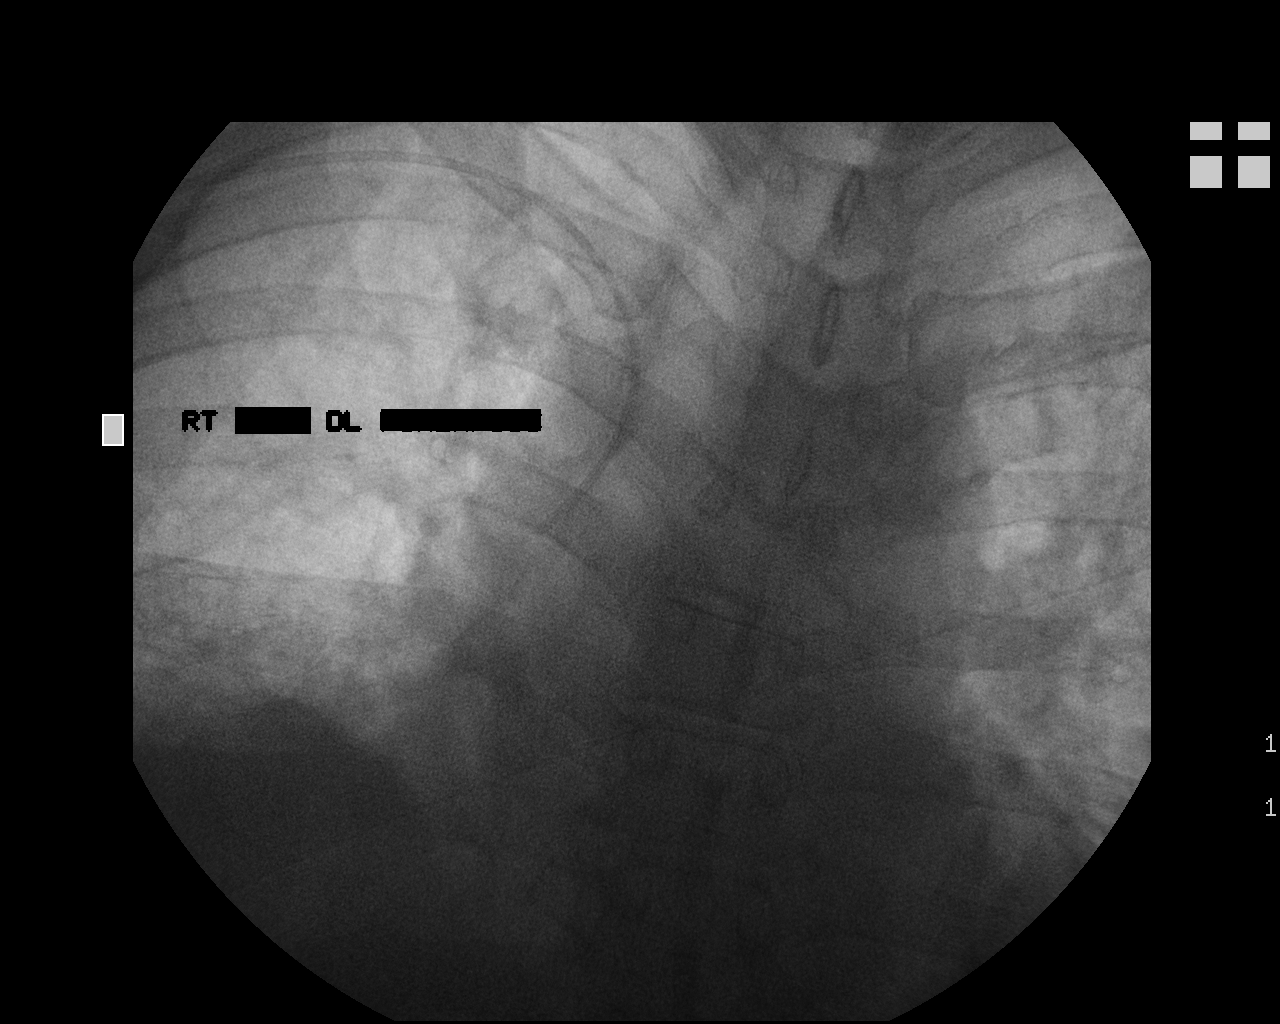

[1 of 1 positions shown; findings below may reference images not displayed]

## 2006-01-04 IMAGING — CR DG CHEST 2V
2 series · 2 of 2 positions shown · non-contrast
Comparison: 08/12/04 and 07/30/04.

CLINICAL DATA: Right-sided chest and shoulder pain. 
 CHEST, TWO VIEWS 08/12/04

[view not recorded (1 of 2)]
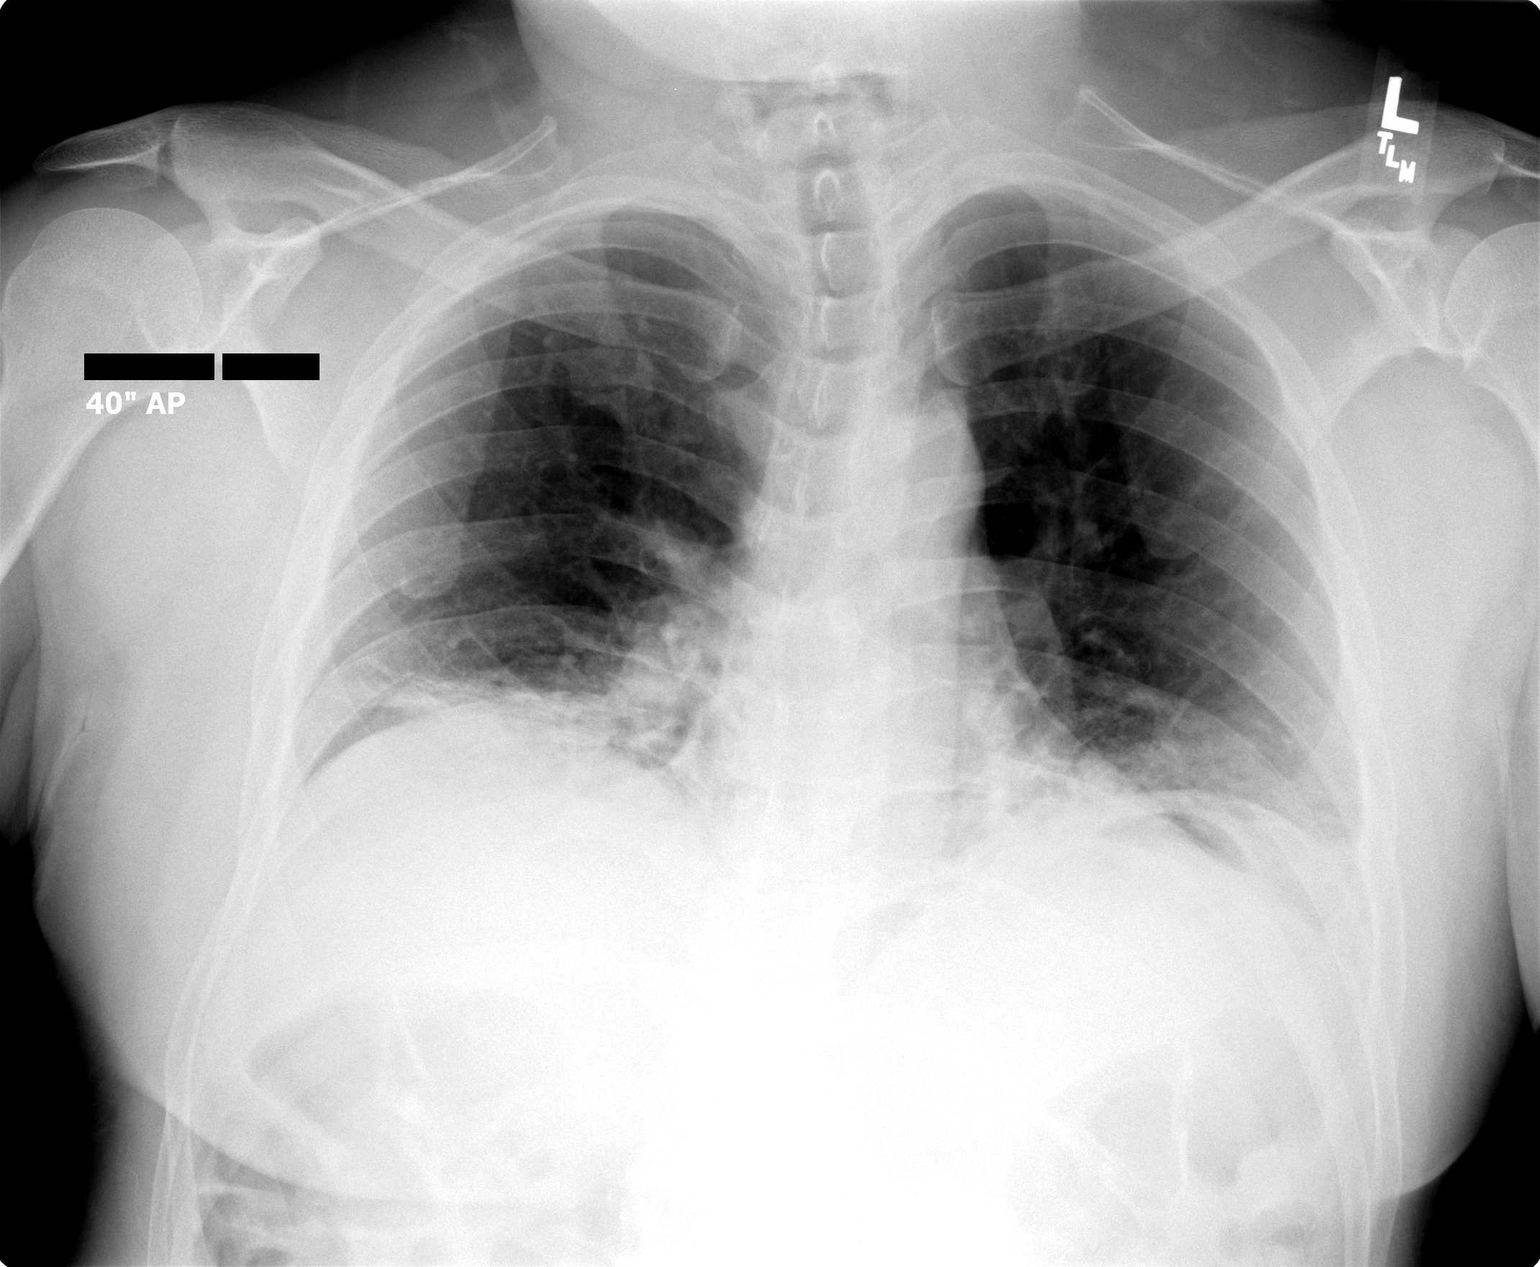

[view not recorded (2 of 2)]
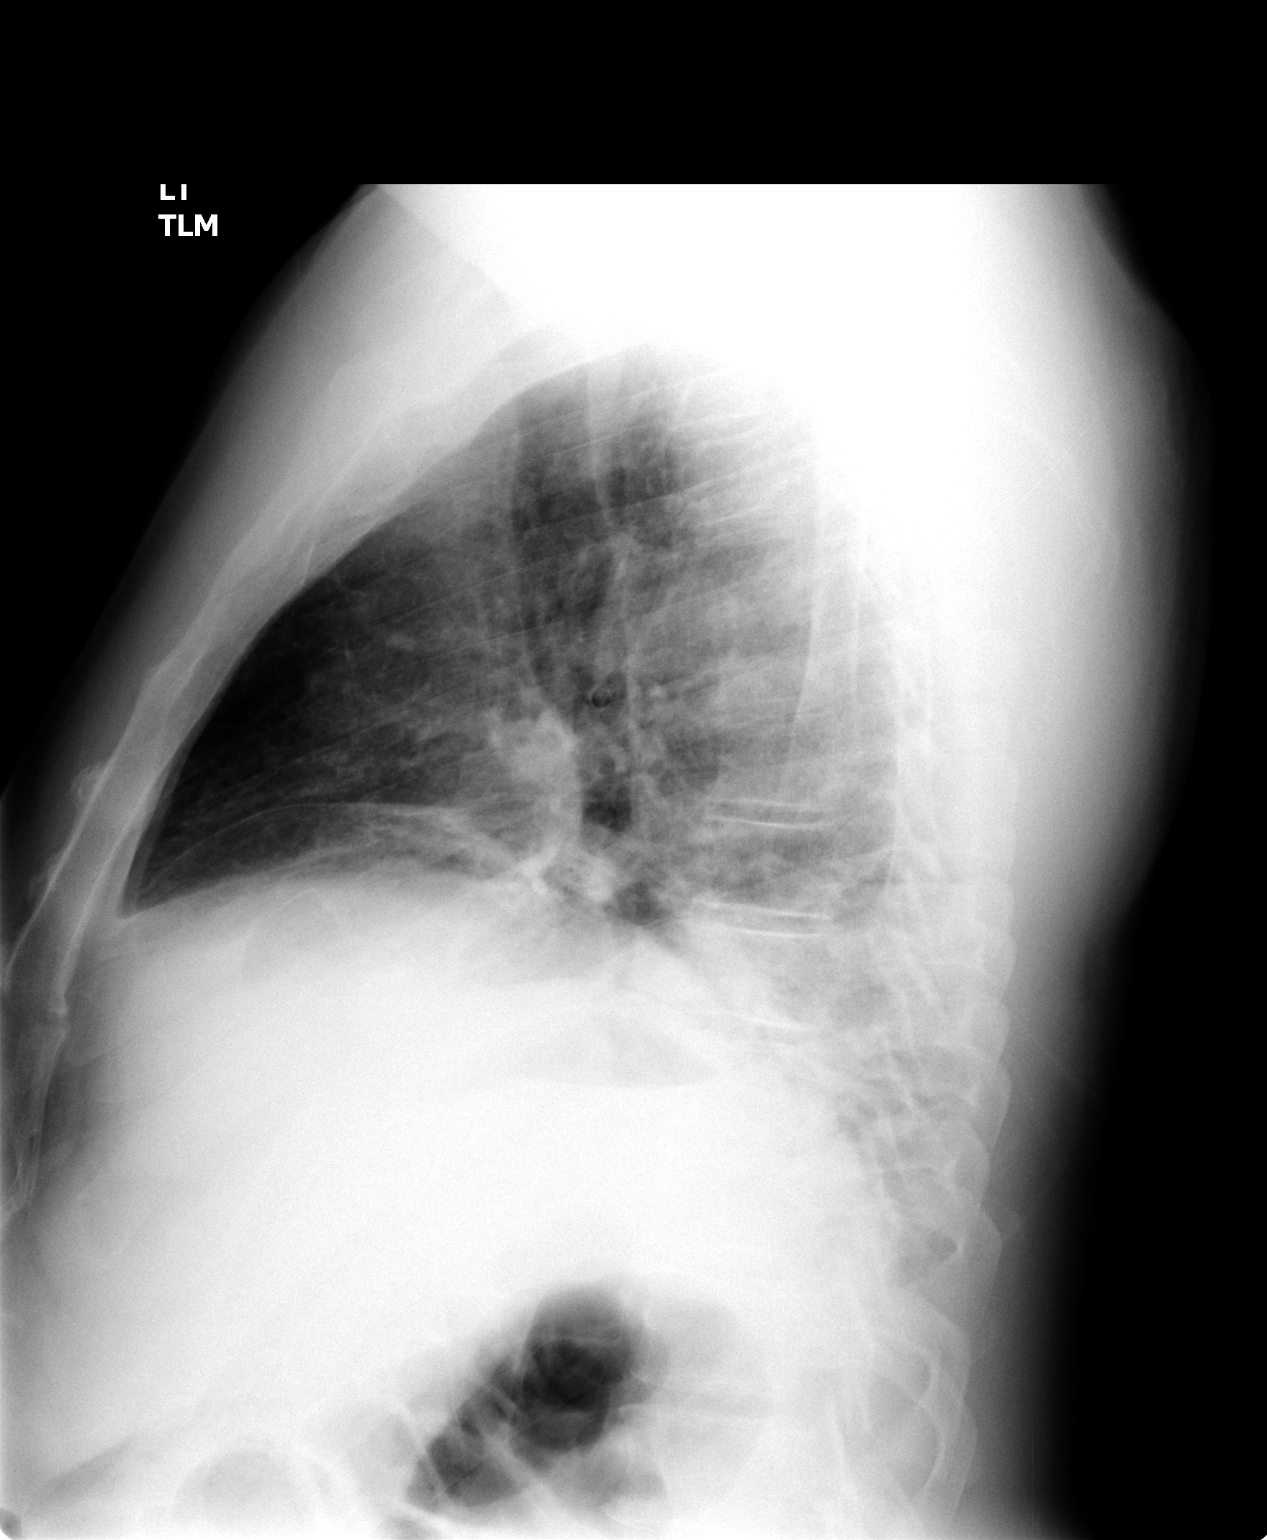

[2 of 2 positions shown; findings below may reference images not displayed]

FINDINGS: Lung volumes rema[REDACTED]reased with bibasilar atelectasis and/or airspace disease.  No significant change in lung aeration. 
 IMPRESSION
 Low volume exam with bibasilar atelectasis and/or airspace disease.

## 2006-01-04 IMAGING — CT CT ANGIO CHEST
2 of 6 series · 14 of 31 positions shown · IV contrast (120 ML OMNI)
Comparison: None.

CLINICAL DATA: Right chest and shoulder pain. Cough.

CT ANGIOGRAPHY OF CHEST ( PULMONARY EMBOLISM PROTOCOL)  08/12/2004
TECHNIQUE: Multidetector CT imaging of the chest was performed according to the protocol for
detection of pulmonary embolism during bolus injection of intravenous contrast.  Coronal and
sagittal plane CT angiographic image reconstructions were also generated.
Contrast:  120 cc Omnipaque 300

[Series 6: recon 3: cta chest · axial · 0.66mm/px · z∈[-216,-11]mm · 12 of 395 slices shown]
[im 33/395  lung]
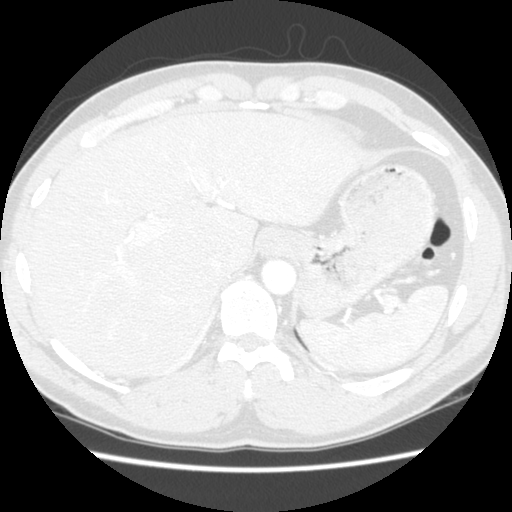
[im 66/395  mediastinal]
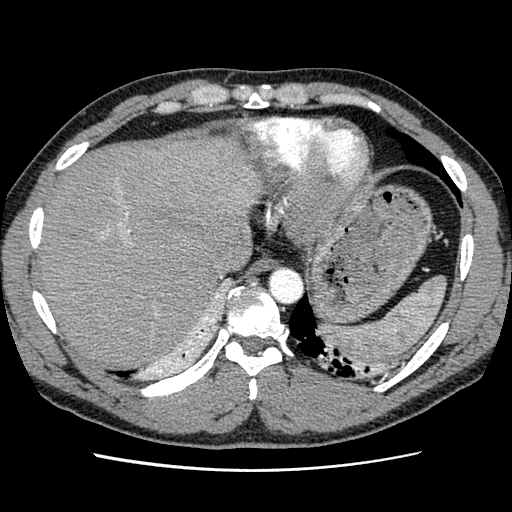
[im 99/395  lung]
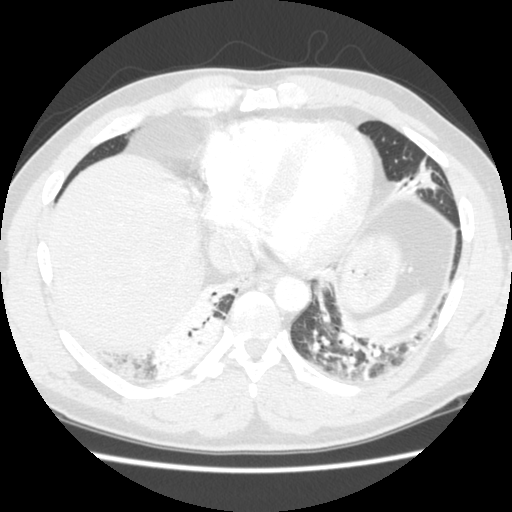
[im 132/395  mediastinal]
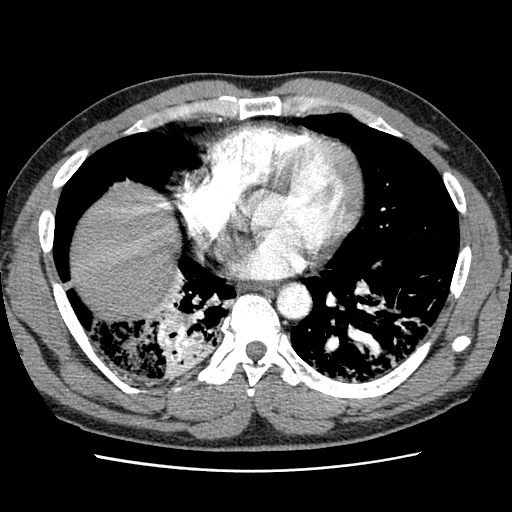
[im 165/395  lung]
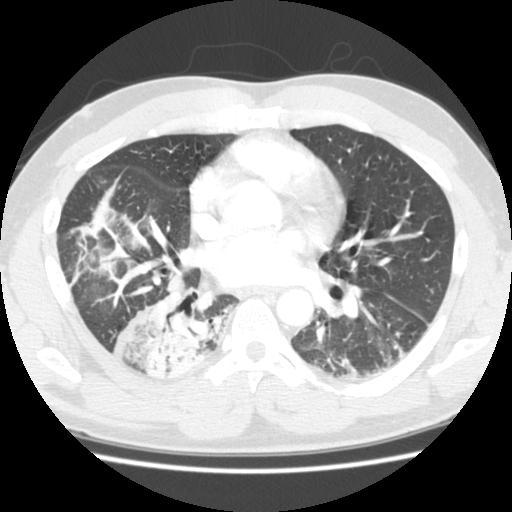
[im 198/395  mediastinal]
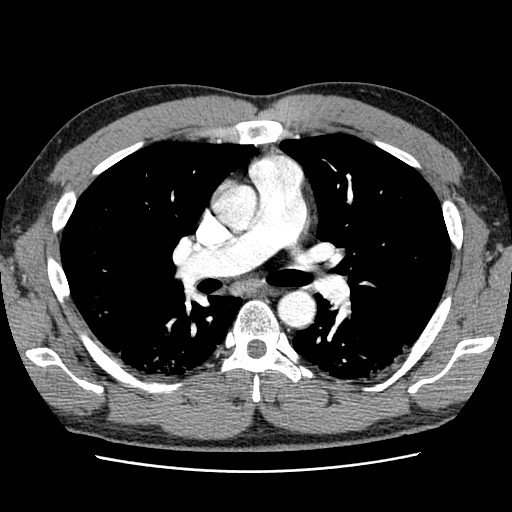
[im 219/395  lung]
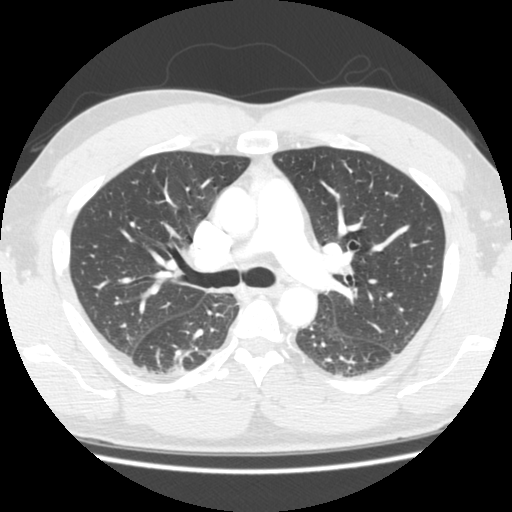
[im 230/395  mediastinal]
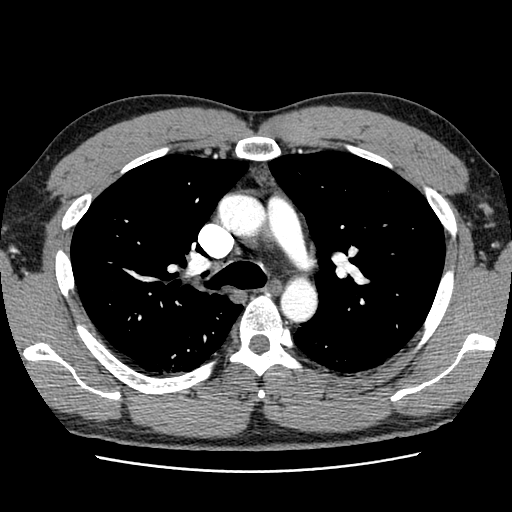
[im 263/395  lung]
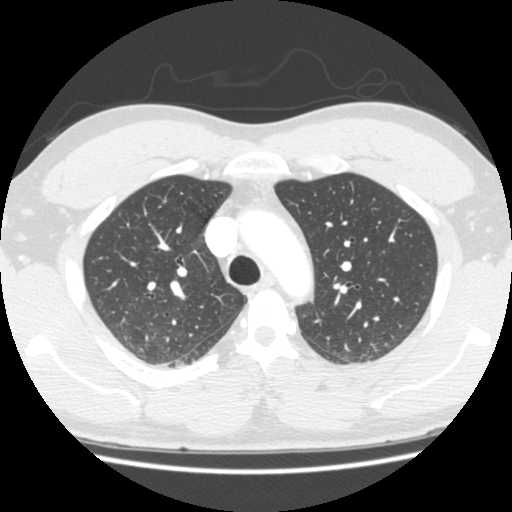
[im 296/395  mediastinal]
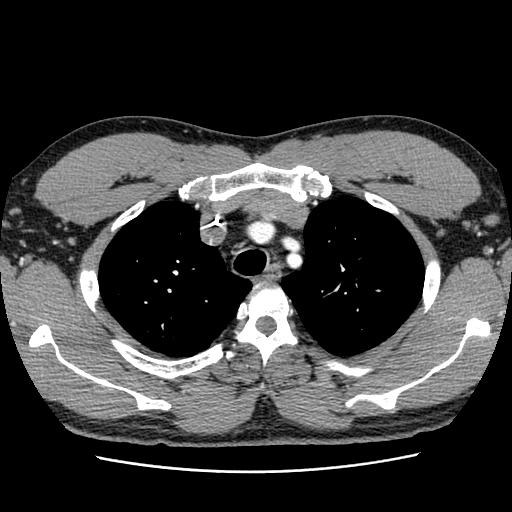
[im 329/395  lung]
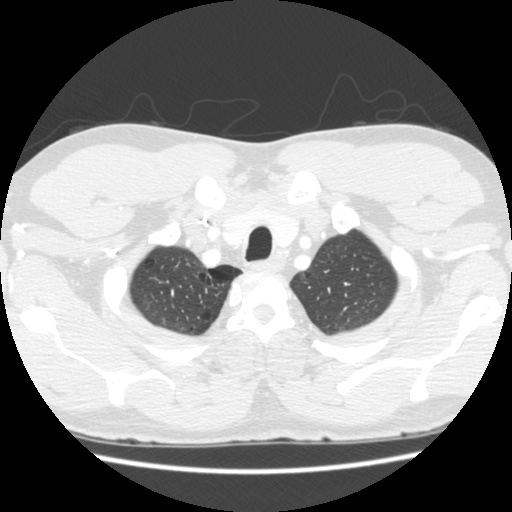
[im 362/395  mediastinal]
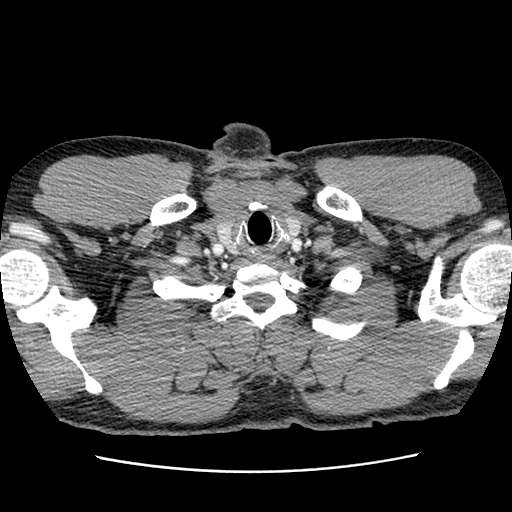

[Series 601: reformatted · sagittal · 0.66mm/px · 2 of 135 slices shown]
[im 45/135  lung]
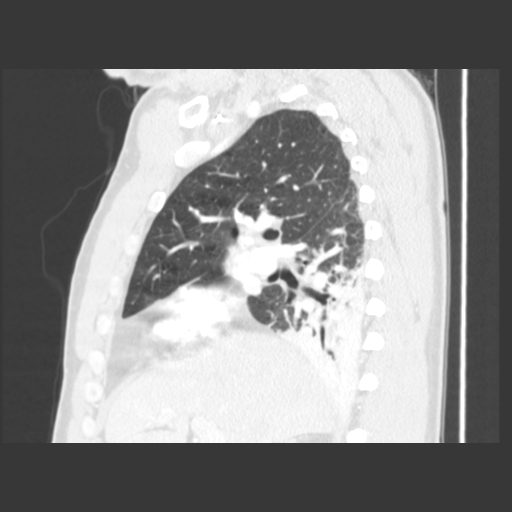
[im 90/135  lung]
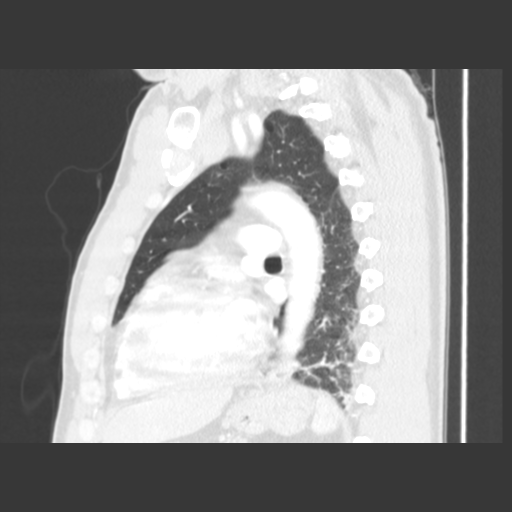

[14 of 31 positions shown; findings below may reference images not displayed]

FINDINGS: There are filling defects with an subsegmental branches of the right lower lobe pulmonary
artery, consistent with acute emboli. This is confirmed upon review of the computer generated
reformatted images. There is dense consolidation in the right lower lobe, with patchier airspace
disease noted in the inferior right upper lobe and in the left lower lobe. No pulmonary emboli are
identified elsewhere. The heart is upper normal in size to perhaps mildly enlarged with left
ventricular hypertrophy. There is no pericardial effusion. There are no pleural effusions. There is
no significant lymphadenopathy. Note is made of bovine aortic arch anatomy (left common carotid
arising from the innominate artery). Peripheral bullae are noted in both upper lobes.

The visualized upper abdomen is unremarkable for the early arterial phase of enhancement.
IMPRESSION: 1. Acute emboli involving subsegmental branches of the right lower lobe pulmonary artery.

2. Dense right lower lobe atelectasis versus pneumonia with patchier airspace disease in the
inferior right upper lobe and in the left lower lobe.

3. Borderline cardiomegaly with left ventricular hypertrophy.

## 2006-01-04 IMAGING — CR DG CHEST 2V
2 series · 2 of 2 positions shown · non-contrast
Comparison: 07/30/04.

CLINICAL DATA: 43-year-old male.  Right shoulder and chest pain. 
 CHEST, TWO VIEWS

[view not recorded (1 of 2)]
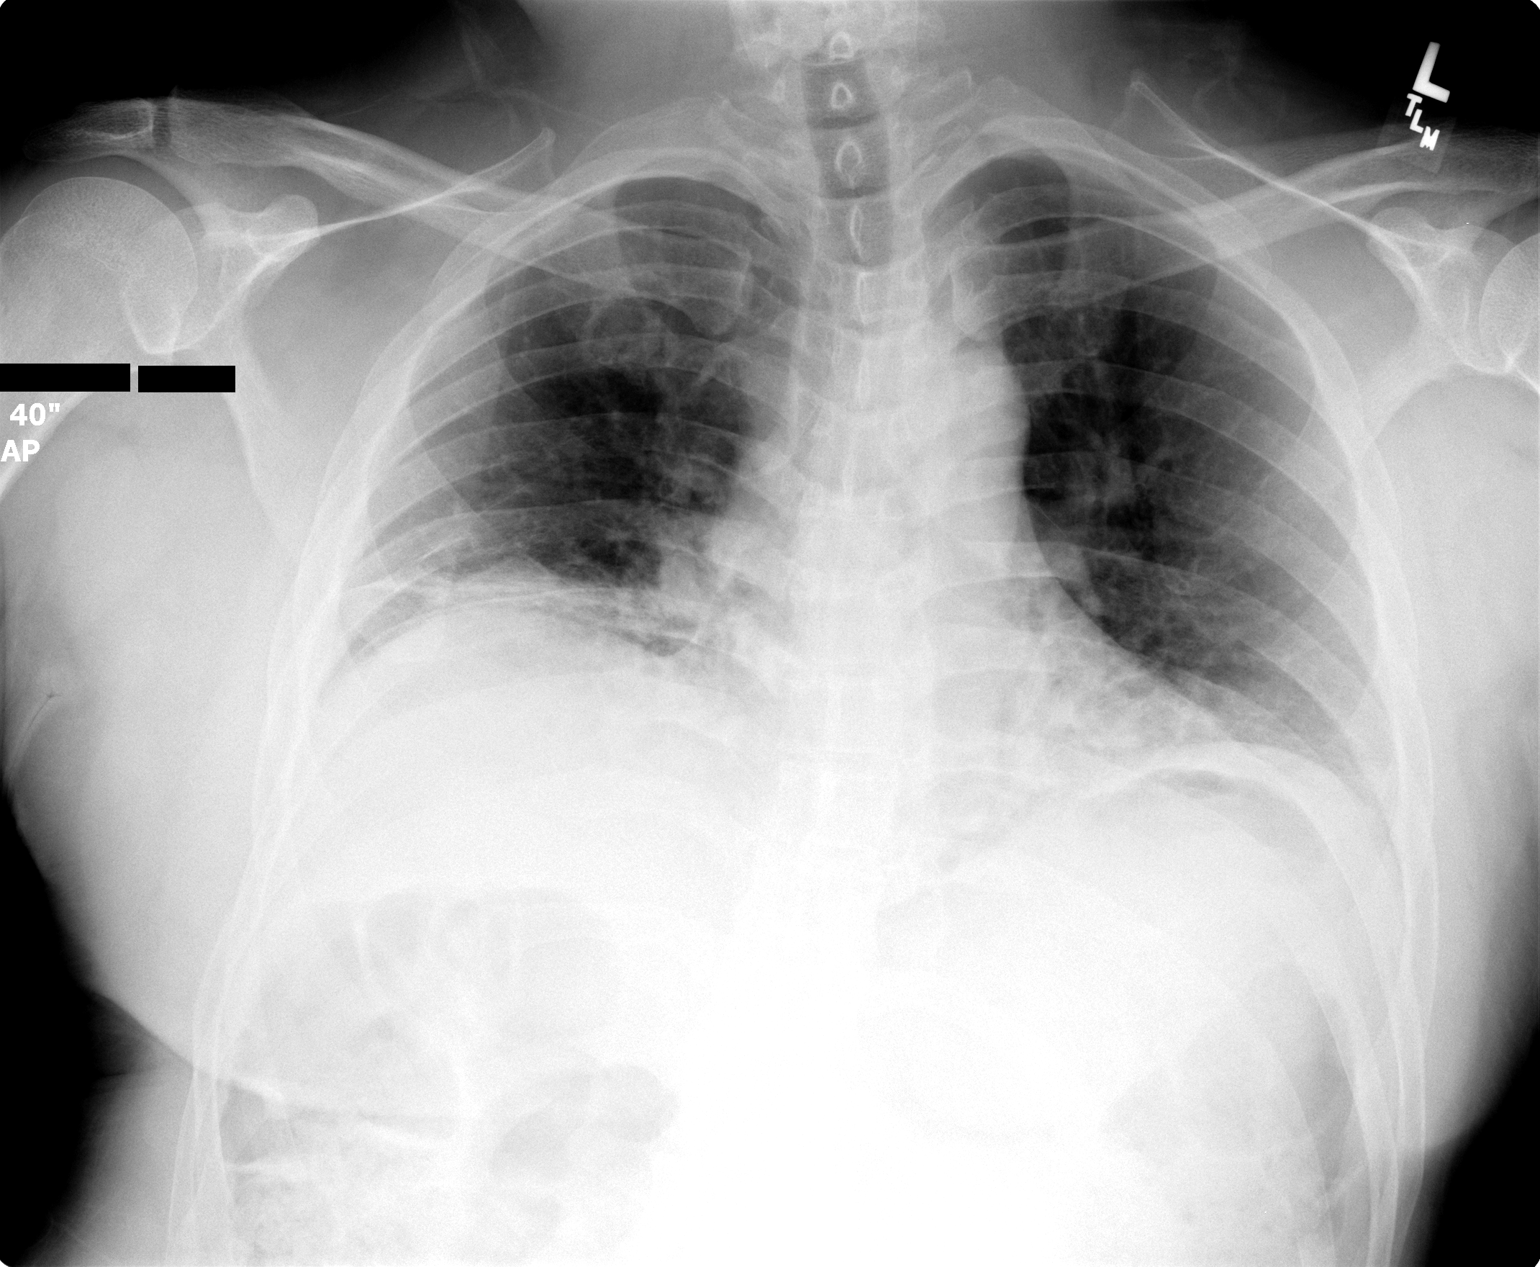

[view not recorded (2 of 2)]
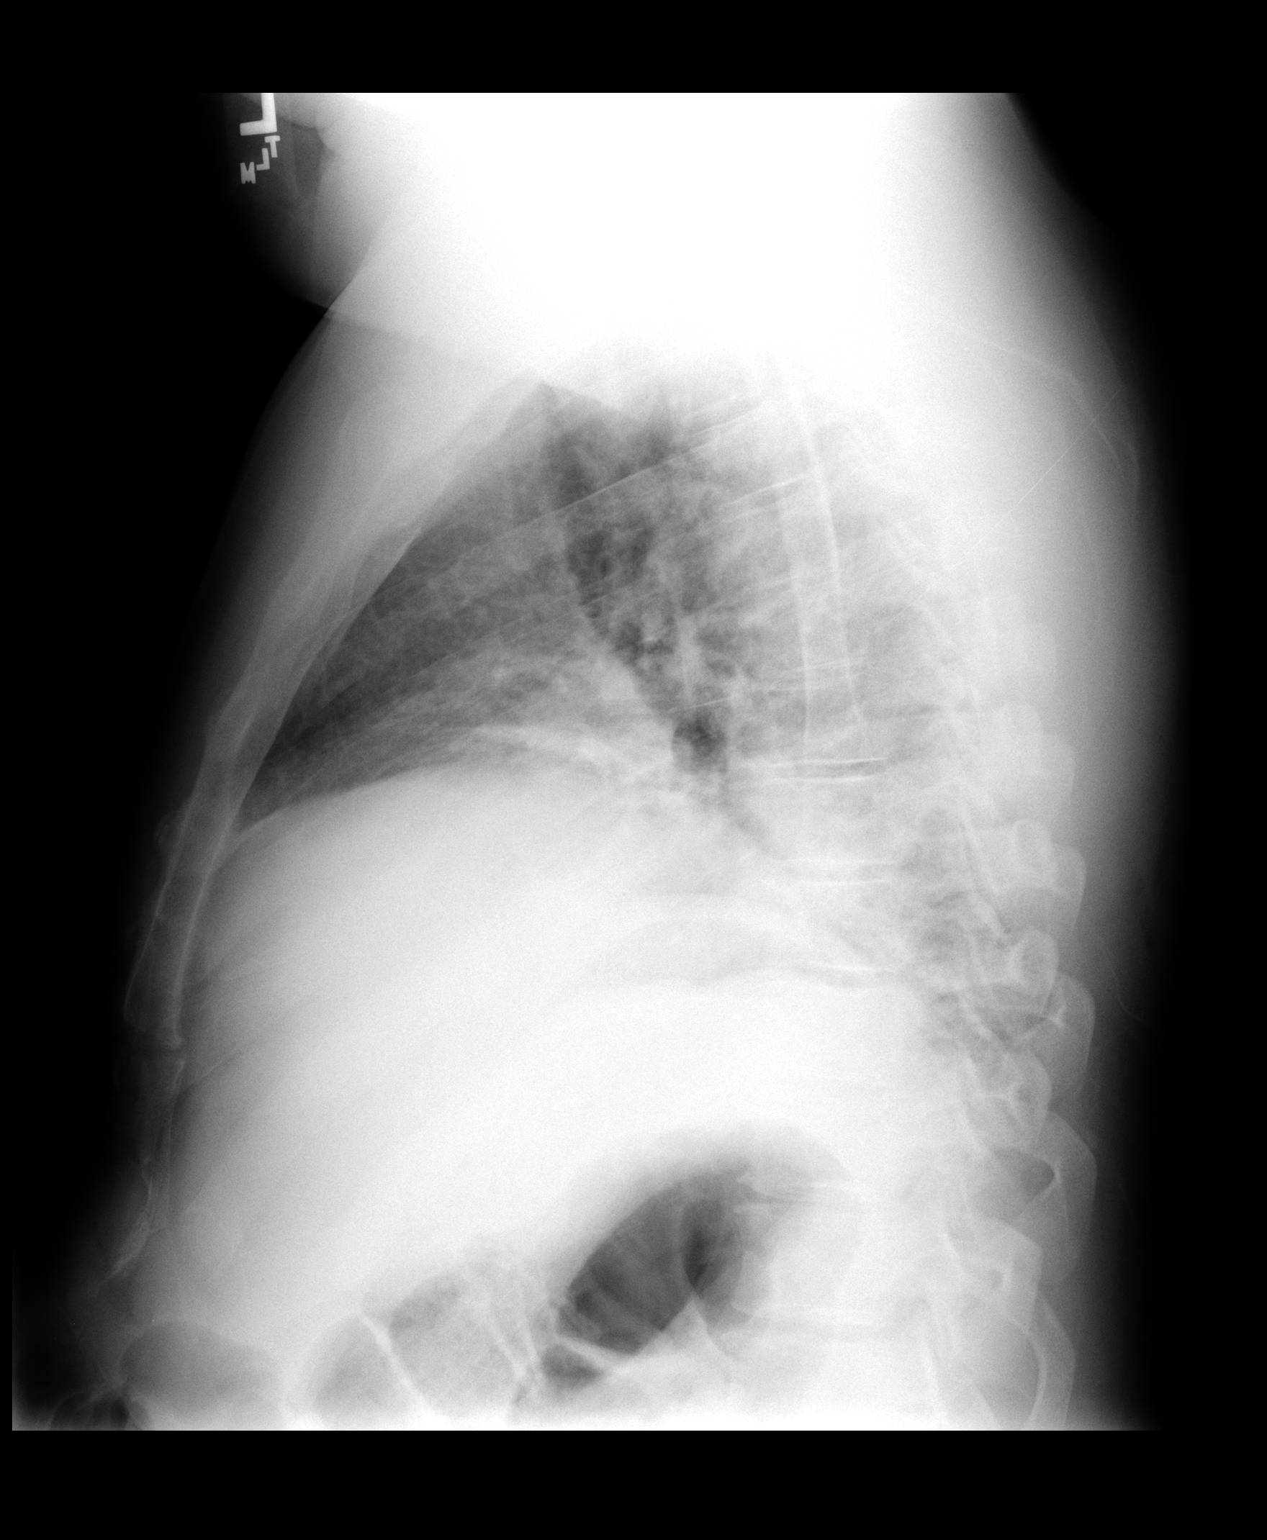

[2 of 2 positions shown; findings below may reference images not displayed]

FINDINGS: The lung volumes are markedly decreased with bibasilar atelectasis and/or airspace disease.  No effusion or pneumothorax.  
 IMPRESSION
 Expiratory exam with bibasilar atelectasis, less likely airspace disease.

## 2006-01-08 IMAGING — CR DG CHEST 1V PORT
1 series · 1 of 1 positions shown · non-contrast
Comparison: 08/12/2004.

CLINICAL DATA: 43-year-old male, right lower lobe pulmonary emboli, shortness of breath.    
 SINGLE VIEW PORTABLE CHEST

[view not recorded]
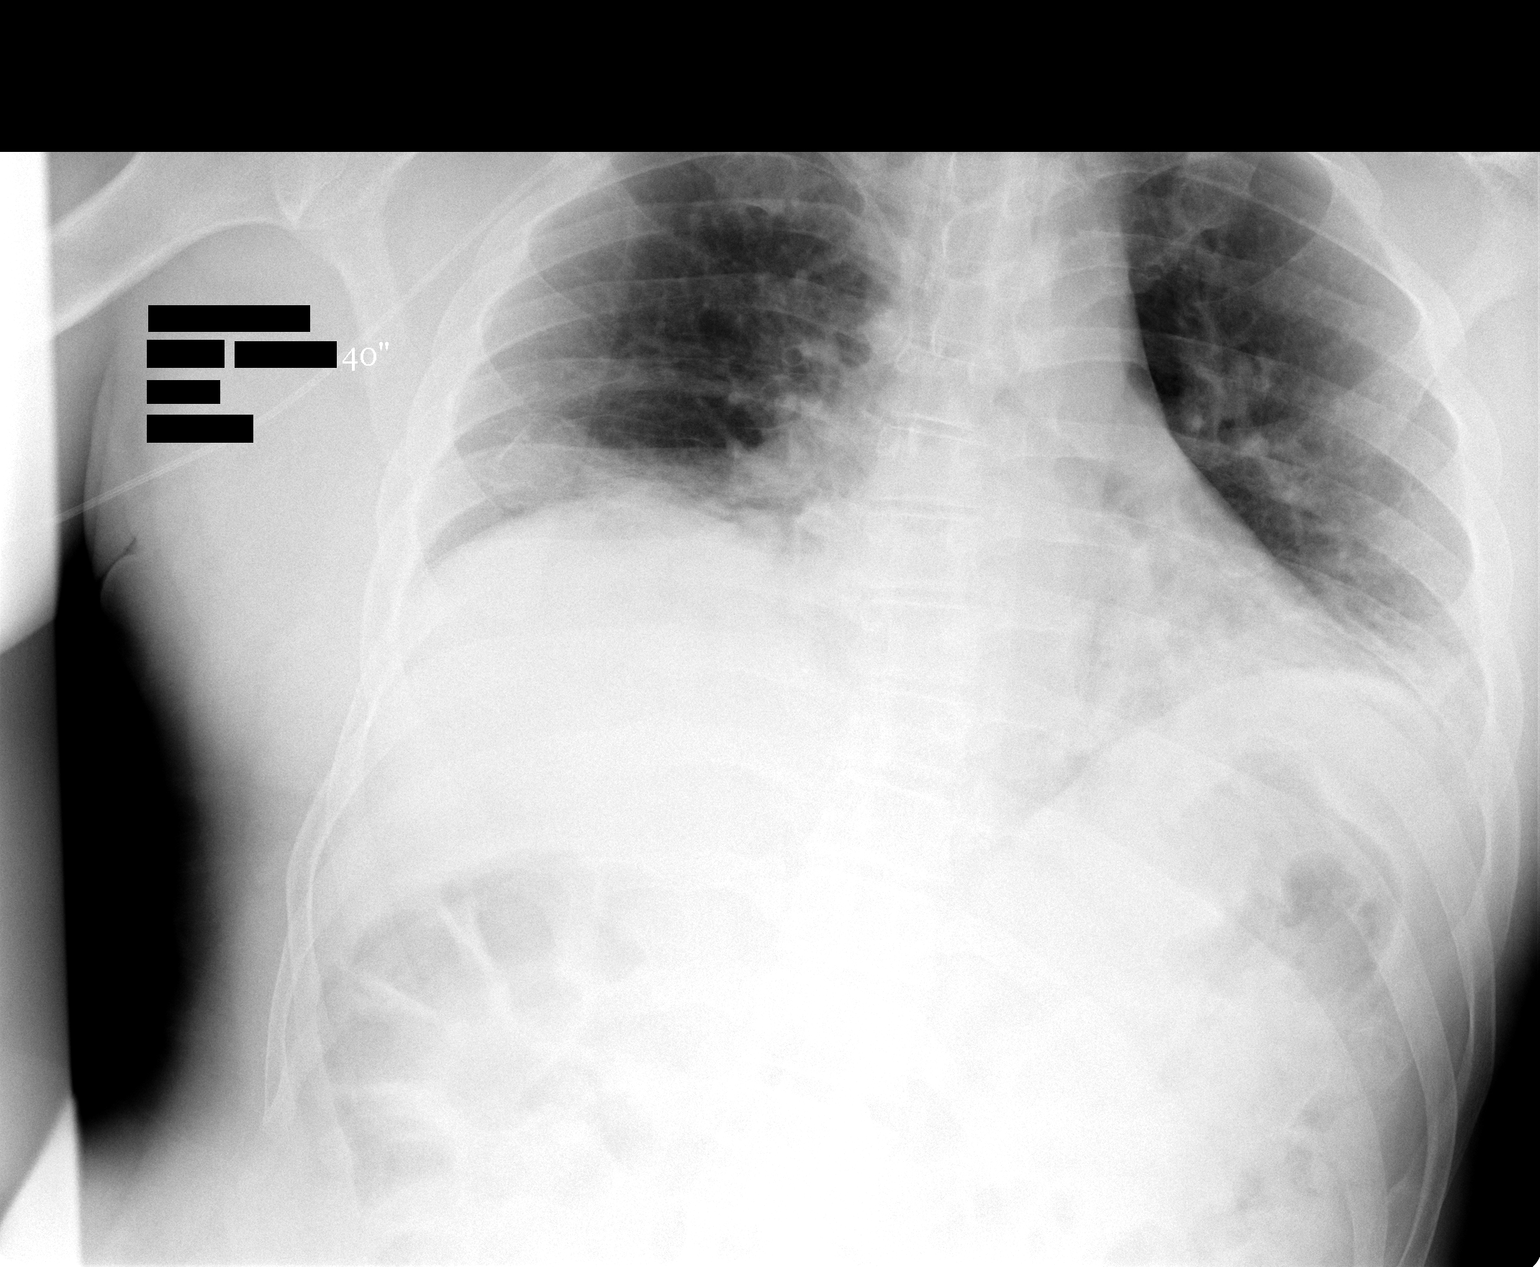

[1 of 1 positions shown; findings below may reference images not displayed]

FINDINGS: New right PICC line tip is in the proximal SVC.  Lung volumes are decreased with right greater than left bibasilar atelectasis.  No significant change.  No pneumothorax.  
 IMPRESSION
 Persistent bibasilar atelectasis and low volumes.

## 2006-02-27 ENCOUNTER — Encounter: Admission: RE | Admit: 2006-02-27 | Discharge: 2006-02-27 | Payer: Self-pay | Admitting: Family Medicine

## 2006-04-15 IMAGING — MR MR [PERSON_NAME] LOW JT W/O CM*L*
6 of 8 series · 30 of 40 positions shown · IV contrast (agent unspecified)
Comparison: none

CLINICAL DATA: 44 year-old with knee pain. History of previous knee surgery.
MRI LEFT KNEE WITHOUT CONTRAST:
Multiplanar multi-sequence imaging was performed. 
No acute bony findings. The articular cartilage is intact without significant changes of degenerative chondrosis and no osteochondral lesions.  The cruciate and collateral ligaments are intact.  The medial meniscus demonstrate normal morphology.  I suspect there has been a partial meniscectomy involving the anterior horn of the lateral meniscus.  No recurrent tear is seen.  The medial meniscus is intact.  No joint effusion or Baker?s cyst.  The patella retinacular structures are intact and the quadriceps and patellar tendons are intact.

[Series 1: 3 plane loc · axial · 4.0mm · 0.94mm/px · z∈[-35,+120]mm · 8 of 45 slices shown]
[im 1/45]
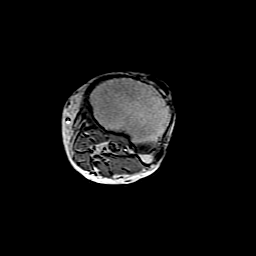
[im 5/45]
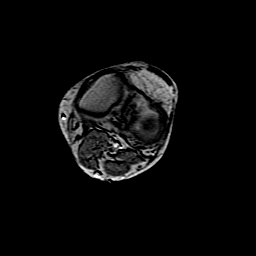
[im 15/45]
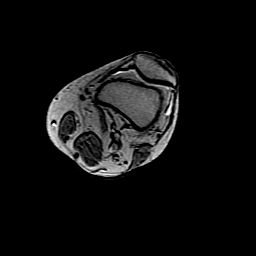
[im 20/45]
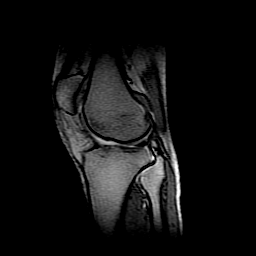
[im 25/45]
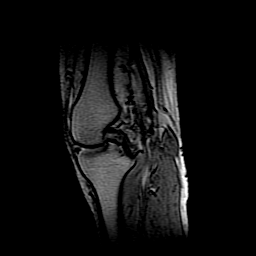
[im 30/45]
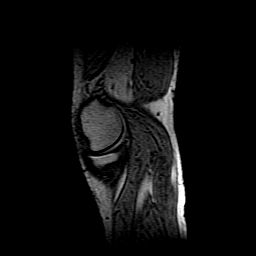
[im 40/45]
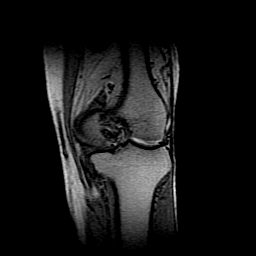
[im 45/45]
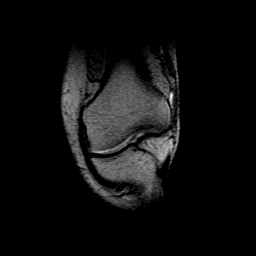

[Series 2: PD · axial · 5.0mm · 0.31mm/px · z∈[-57,+57]mm · 4 of 20 slices shown (1 of 3)]
[im 1/20]
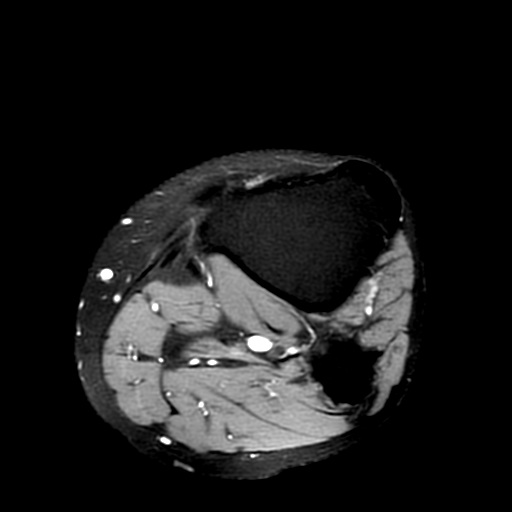
[im 7/20]
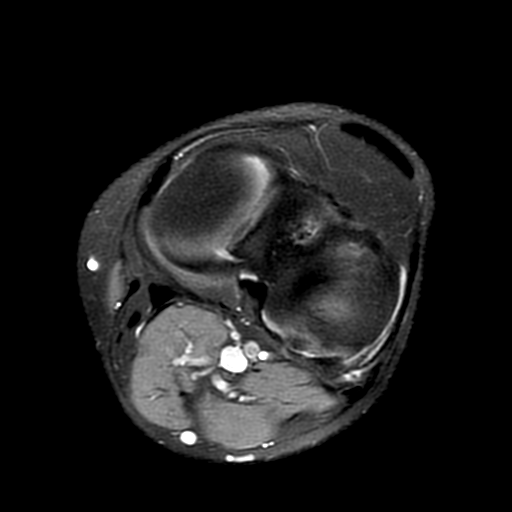
[im 13/20]
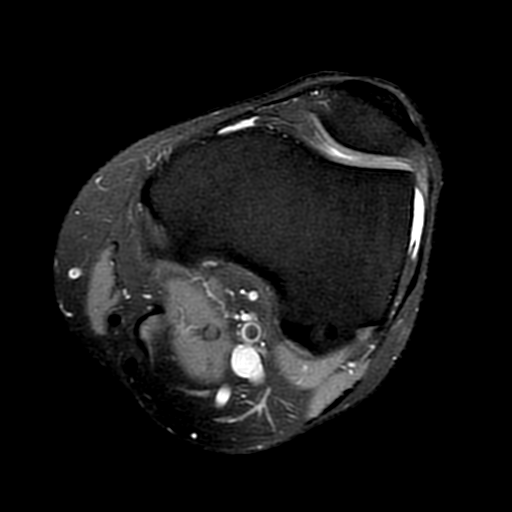
[im 20/20]
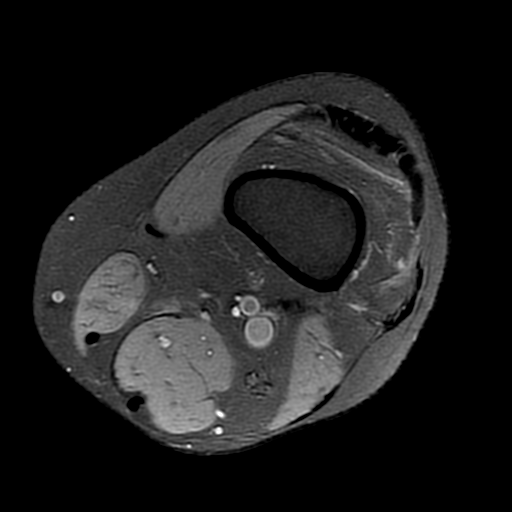

[Series 3: T2 · axial · 5.0mm · 0.31mm/px · z∈[-57,-21]mm · 2 of 20 slices shown]
[im 1/20]
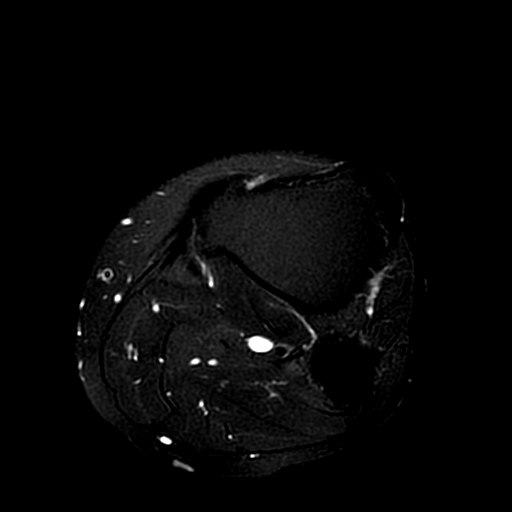
[im 7/20]
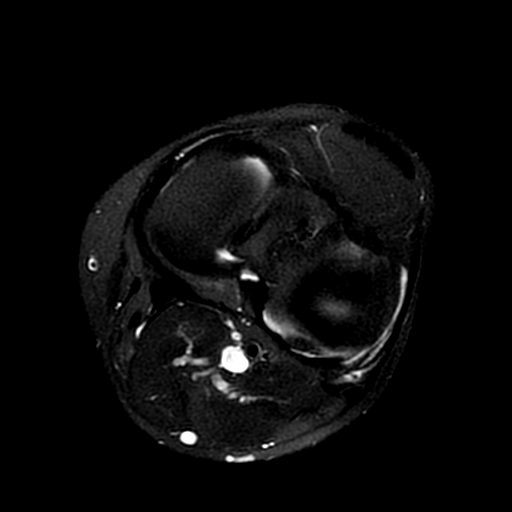

[Series 4: T1 · coronal · 4.0mm · 0.29mm/px · 4 of 20 slices shown]
[im 1/20]
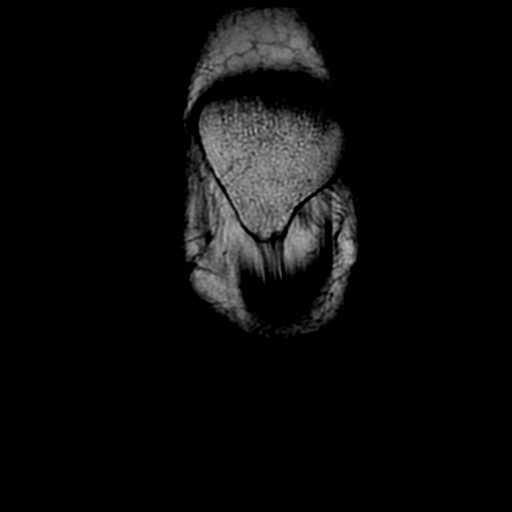
[im 7/20]
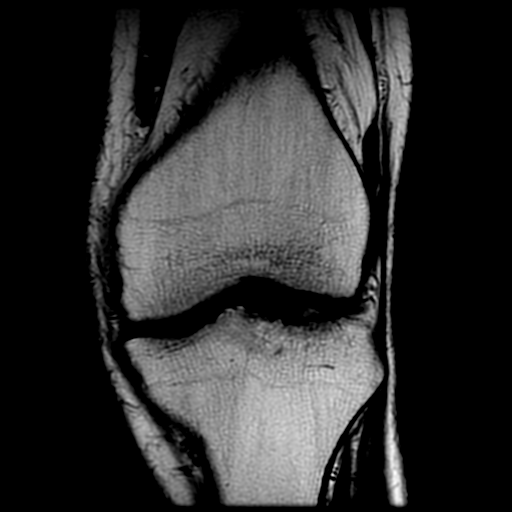
[im 13/20]
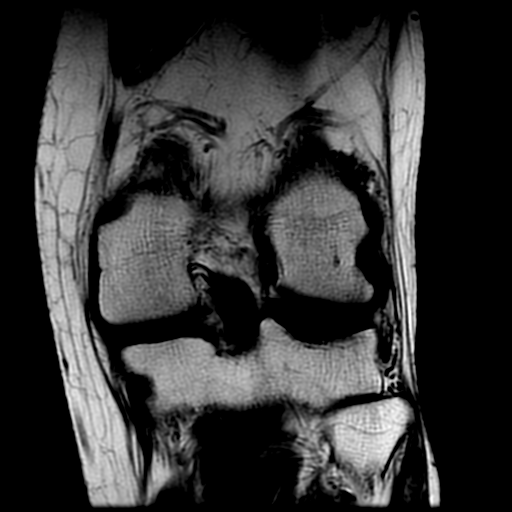
[im 20/20]
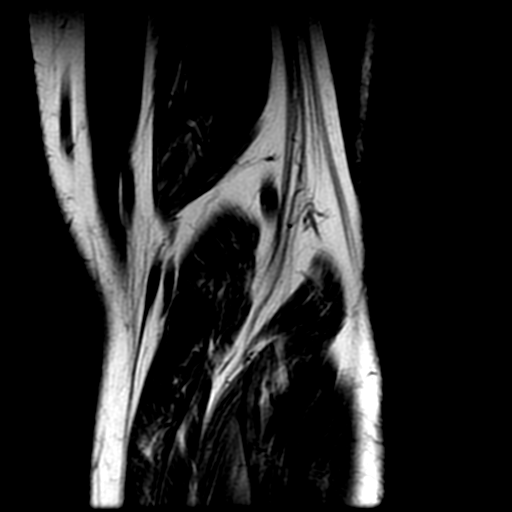

[Series 5: PD · coronal · 4.0mm · 0.29mm/px · 4 of 20 slices shown (2 of 3)]
[im 1/20]
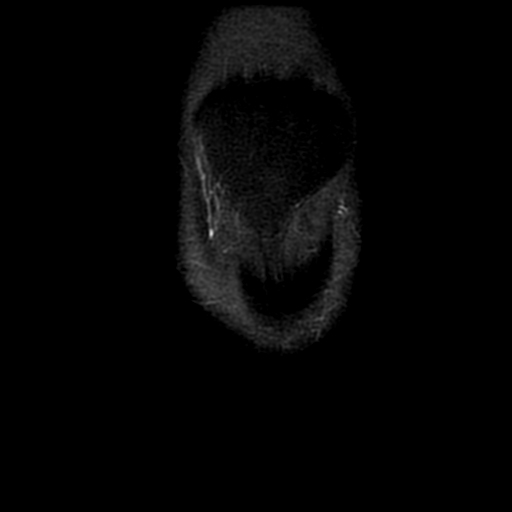
[im 7/20]
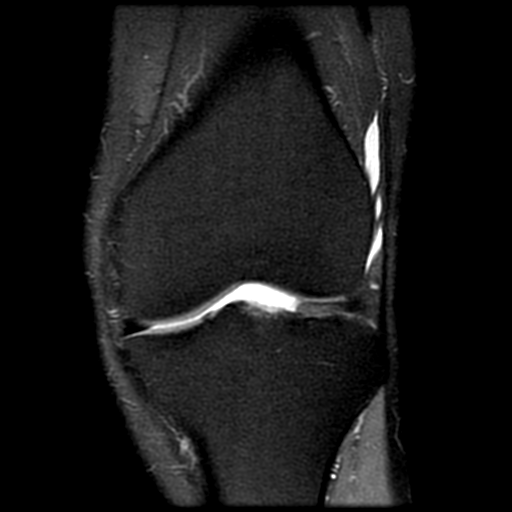
[im 13/20]
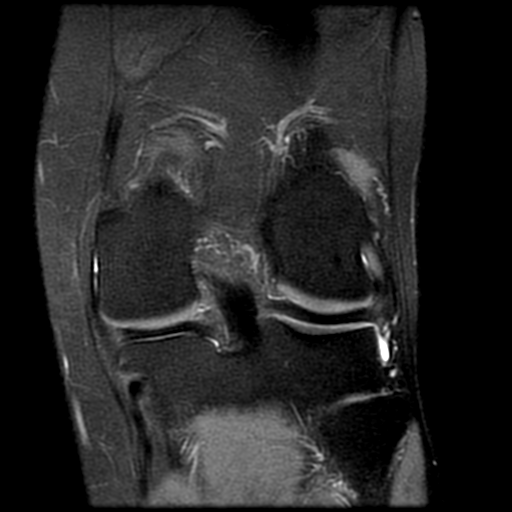
[im 20/20]
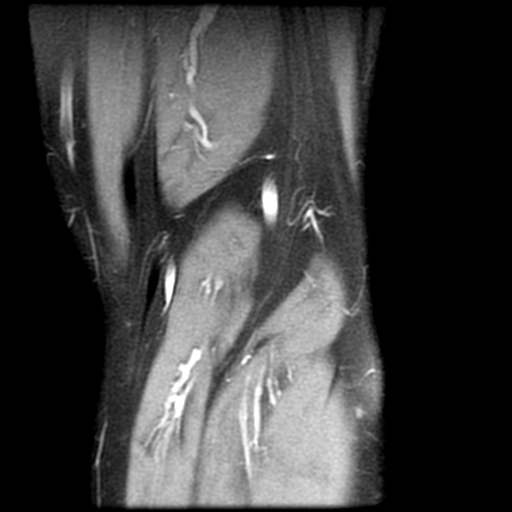

[Series 7: PD · sagittal · 4.0mm · 0.55mm/px · 8 of 40 slices shown (3 of 3)]
[im 1/40]
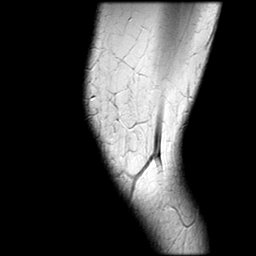
[im 6/40]
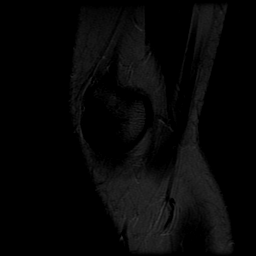
[im 12/40]
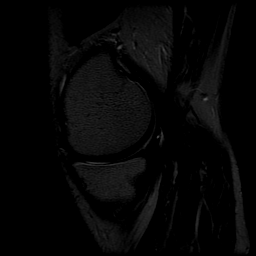
[im 17/40]
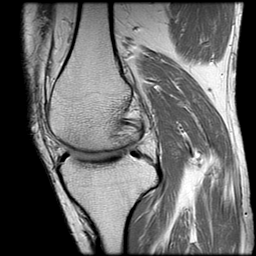
[im 23/40]
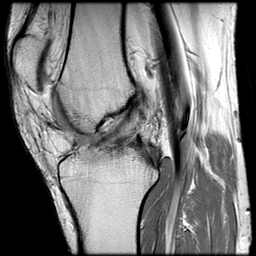
[im 28/40]
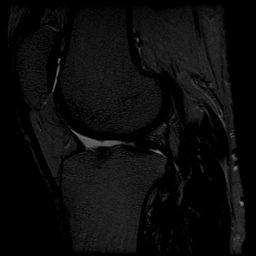
[im 34/40]
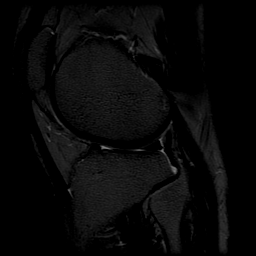
[im 40/40]
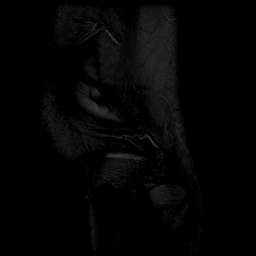

[30 of 40 positions shown; findings below may reference images not displayed]

IMPRESSION: 1.  Probable surgical change involving the anterior horn of the lateral meniscus with partial meniscectomy. No definite meniscal tears are seen.
2.  Intact ligamentous structures and no acute bony findings.
2.  Intact articular cartilage with minimal changes of chondromalacia involving the lateral compartment cartilage.
3.  No joint effusion or Baker?s cysts.

## 2006-04-20 ENCOUNTER — Encounter: Admission: RE | Admit: 2006-04-20 | Discharge: 2006-04-20 | Payer: Self-pay | Admitting: Family Medicine

## 2006-05-24 IMAGING — CR DG CHEST 2V
2 series · 2 of 2 positions shown · non-contrast
Comparison: 08/16/04.

CLINICAL DATA: Shortness of breath. 
 CHEST - TWO VIEW- 12/30/04:

[view not recorded (1 of 2)]
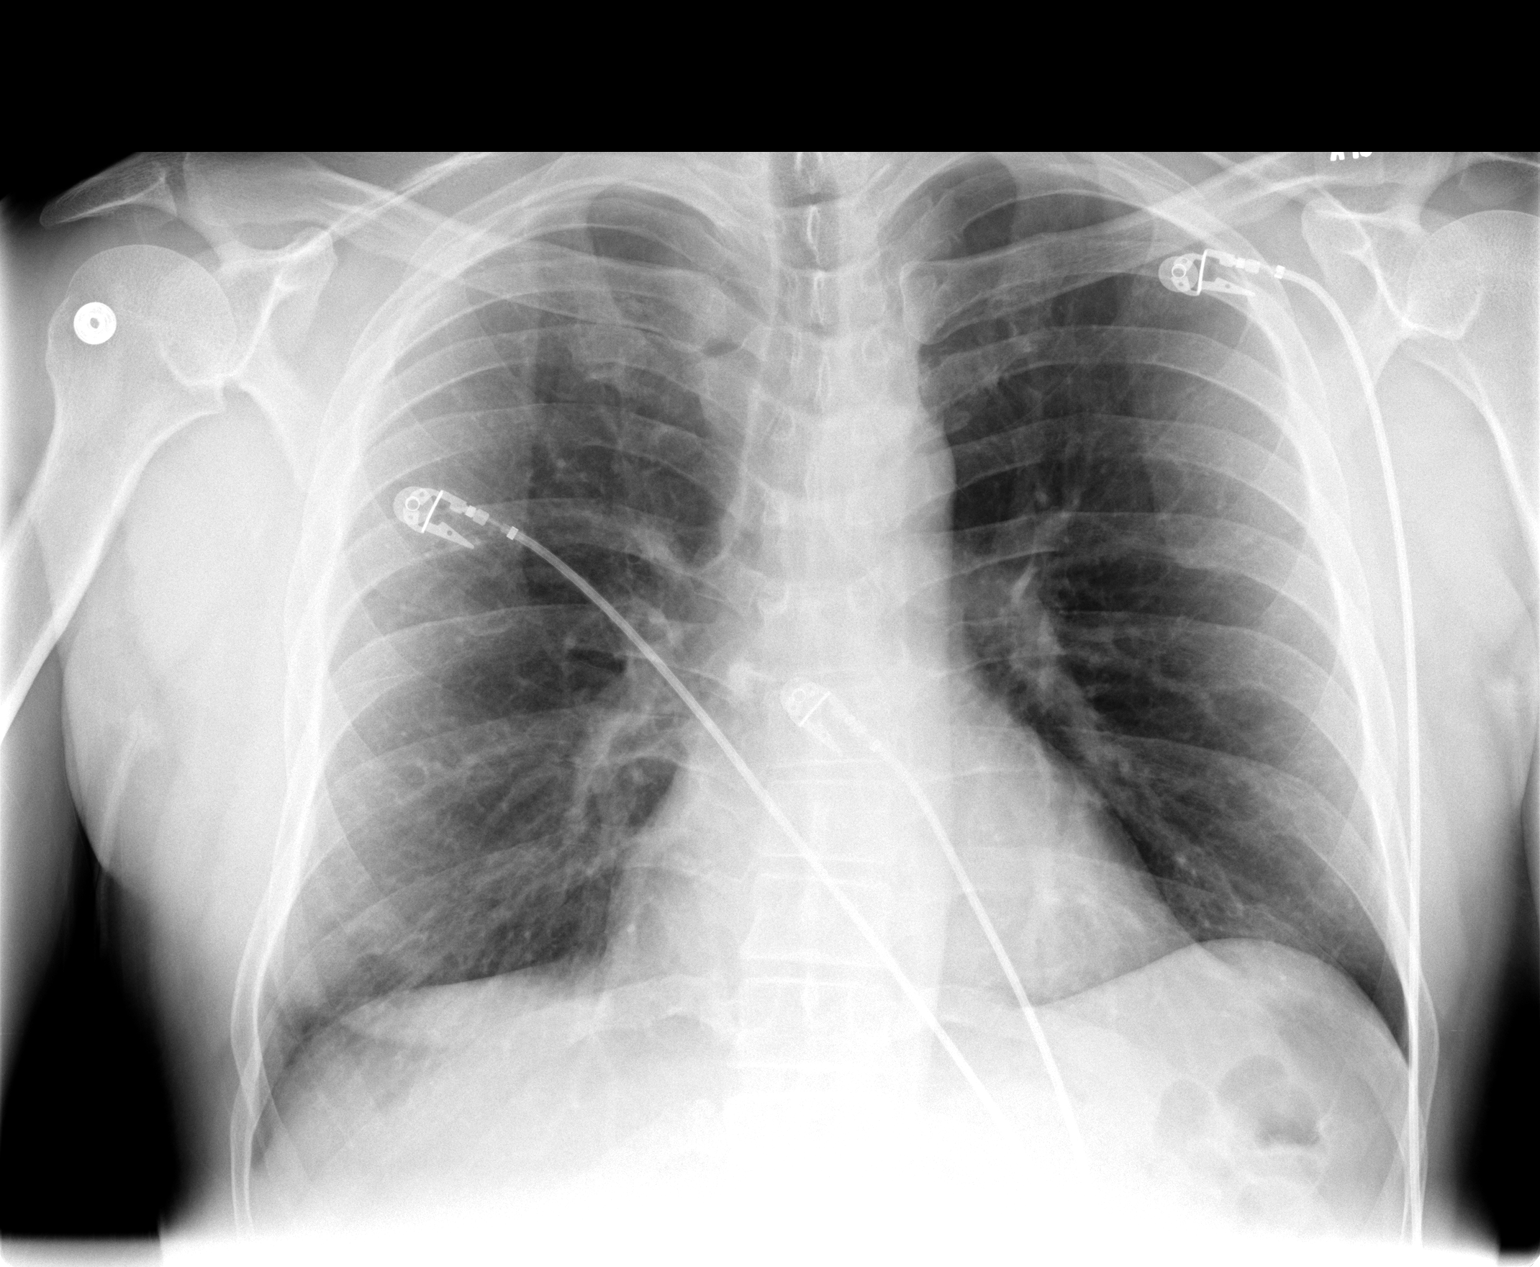

[view not recorded (2 of 2)]
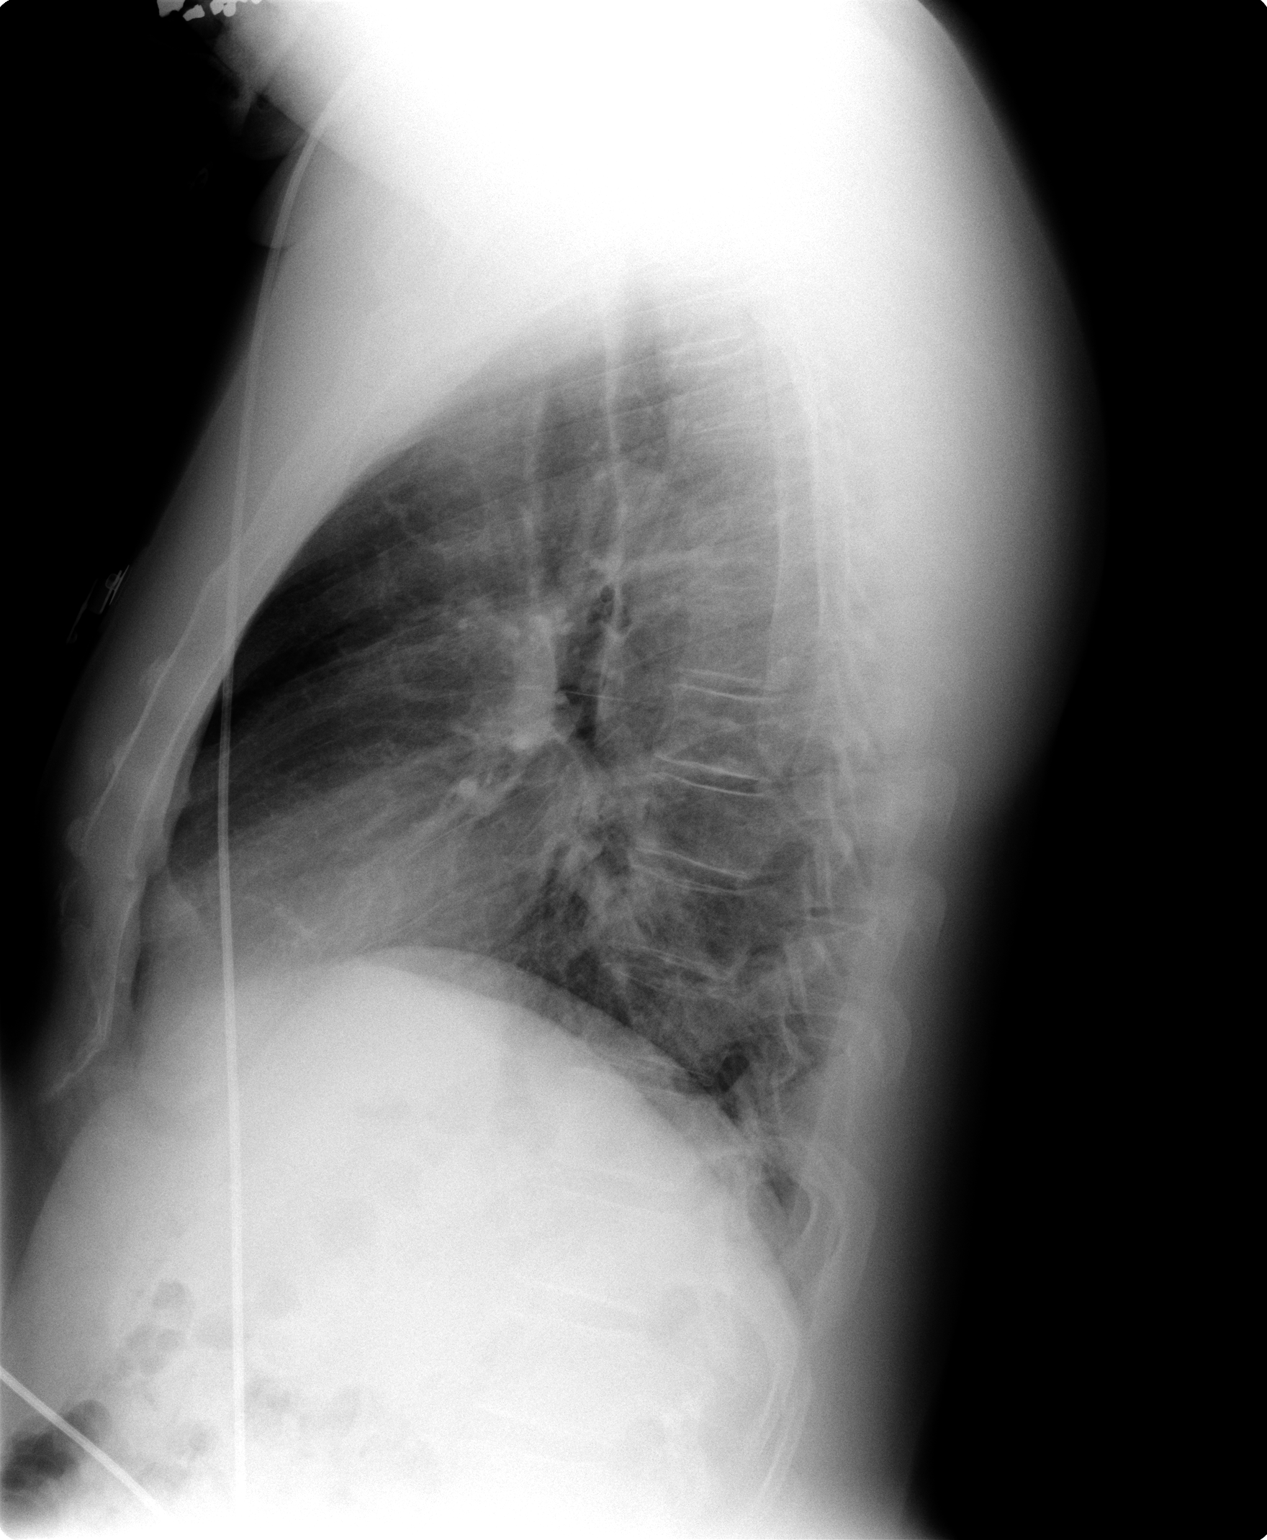

[2 of 2 positions shown; findings below may reference images not displayed]

FINDINGS: The lungs are clear.   No pleural effusion. Heart size normal.  No focal bony abnormality.
IMPRESSION: Negative chest.

## 2006-07-06 IMAGING — MR MR [PERSON_NAME] LOW JT W/O CM*L*
5 of 7 series · 24 of 40 positions shown · non-contrast
Comparison: none

CLINICAL DATA: Left knee and hip pain for three weeks.  History of arthroscopic surgery in 2444.  
 MRI OF THE LEFT KNEE:
 Multiplanar T1- and T2-weighted imaging was performed and compared to the study of 11/21/04.  There is a tiny amount of joint fluid and a small amount of fluid in the semimembranosus gastrocnemius bursa, unchanged since the previous examination.  The medial compartment appears normal without evidence of meniscal tear or articular cartilage pathology.  In the lateral compartment, the anterior horn of the lateral meniscus appears blunted, suggesting that there has been a partial meniscectomy in this region.  There is mild irregularity of the hyaline cartilage in the lateral compartment.  Posterior cruciate ligament is intact.  The anterior cruciate ligament is degenerated, appearing diminutive and edematous.  The collateral ligaments are normal.  The quadriceps and patellar tendons are normal.  The cartilage of the patellofemoral joint appears normal.

[Series 2: loc 3 plane · axial · 8.0mm · 0.68mm/px · z∈[-18,+186]mm · 2 of 9 slices shown]
[im 1/9]
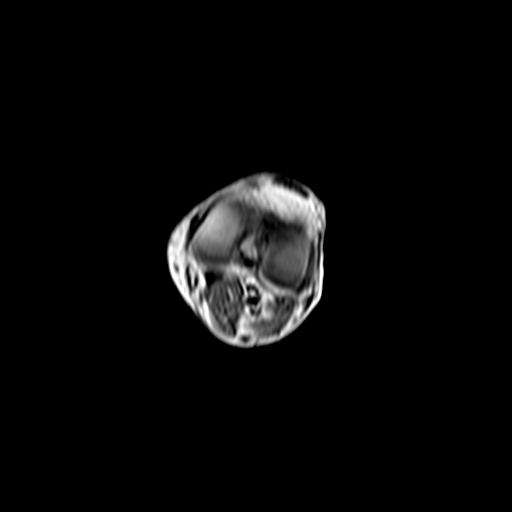
[im 9/9]
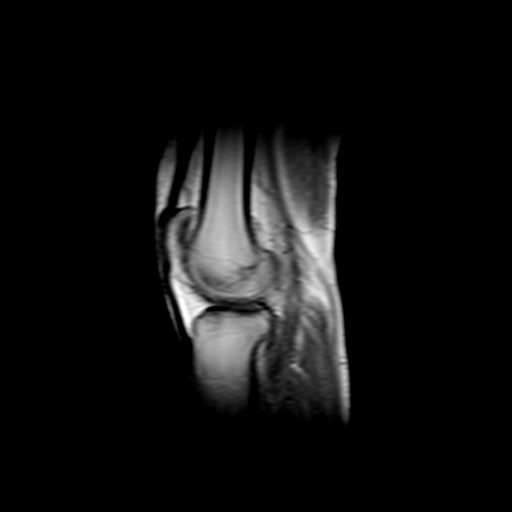

[Series 3: PD · axial · 4.0mm · 0.66mm/px · z∈[-48,+44]mm · 7 of 21 slices shown (1 of 2)]
[im 1/21]
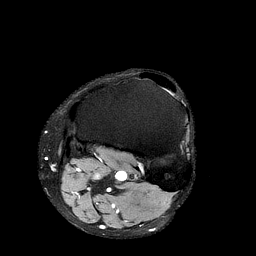
[im 4/21]
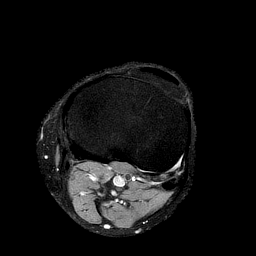
[im 7/21]
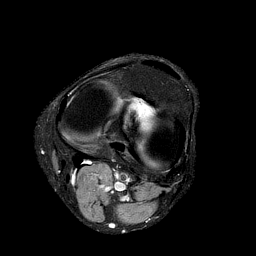
[im 11/21]
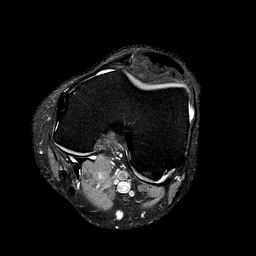
[im 14/21]
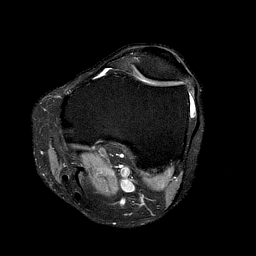
[im 17/21]
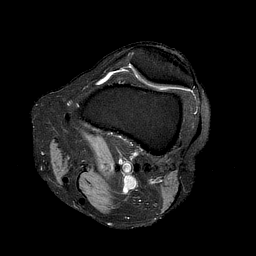
[im 21/21]
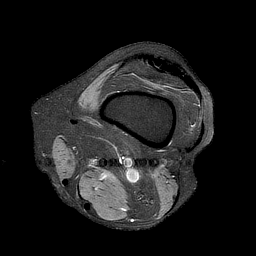

[Series 4: PD · coronal · 4.5mm · 0.33mm/px · 7 of 20 slices shown (2 of 2)]
[im 1/20]
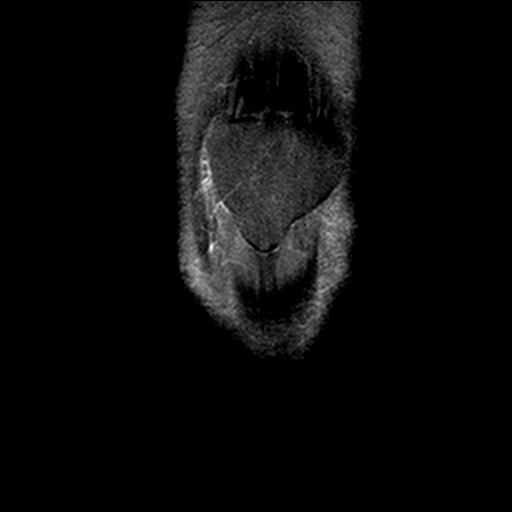
[im 4/20]
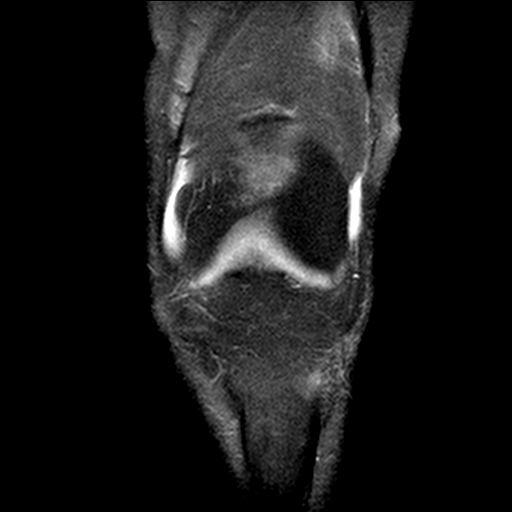
[im 7/20]
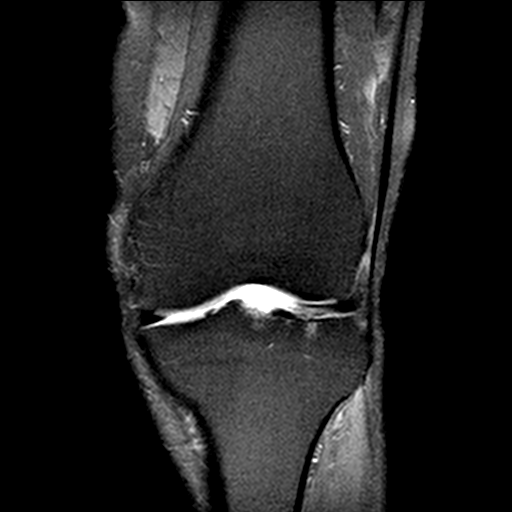
[im 10/20]
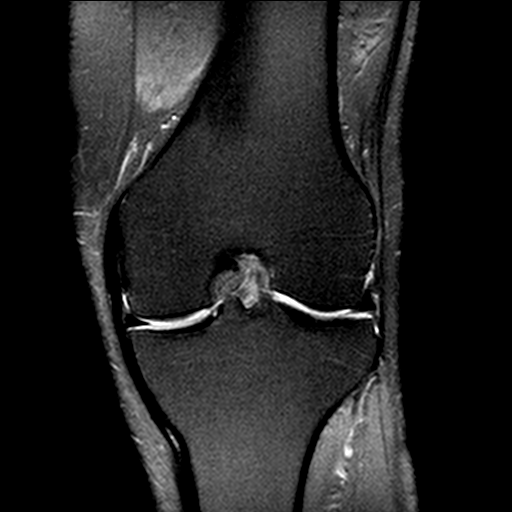
[im 13/20]
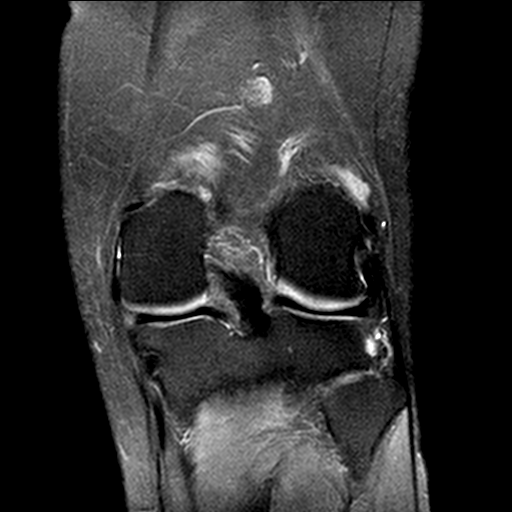
[im 16/20]
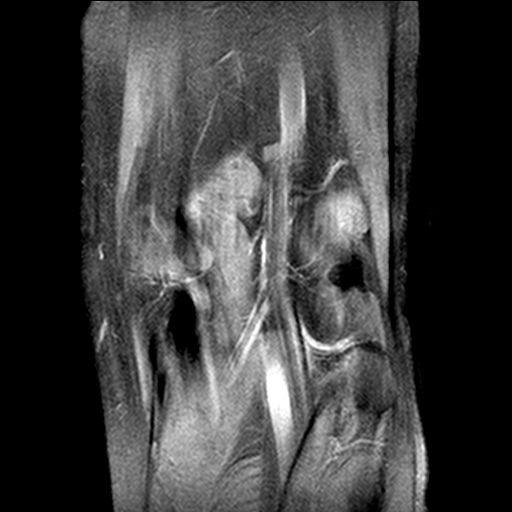
[im 20/20]
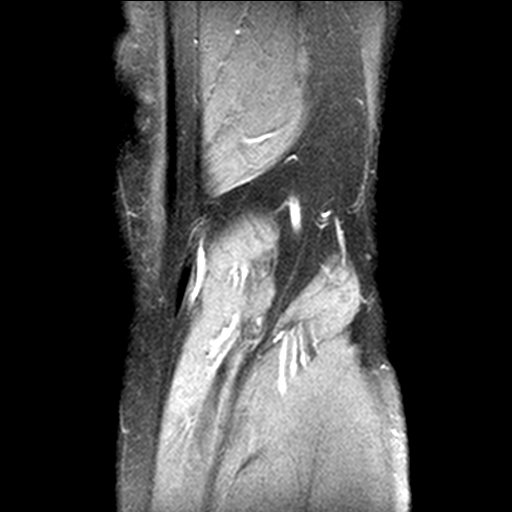

[Series 5: (id) · coronal · 4.5mm · 0.33mm/px · 7 of 20 slices shown]
[im 1/20]
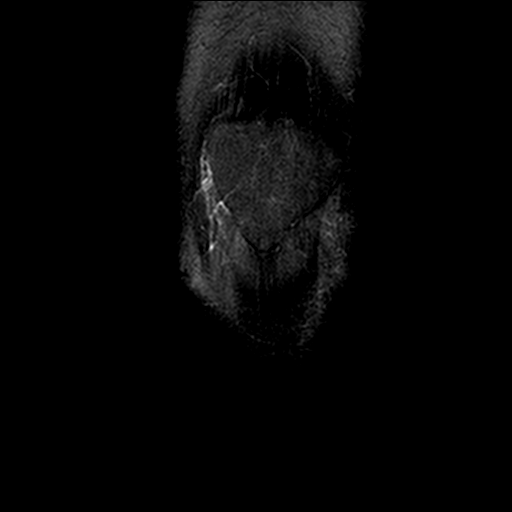
[im 4/20]
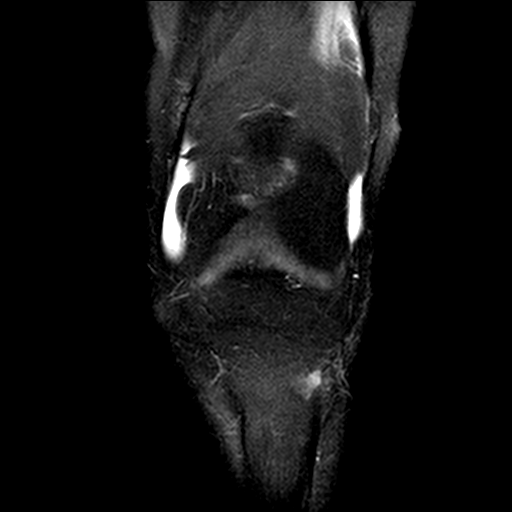
[im 7/20]
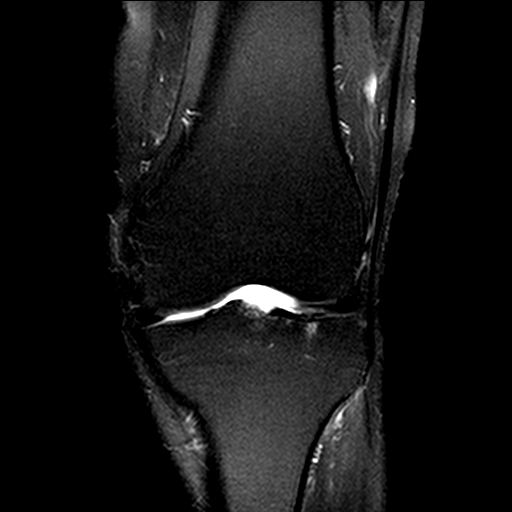
[im 10/20]
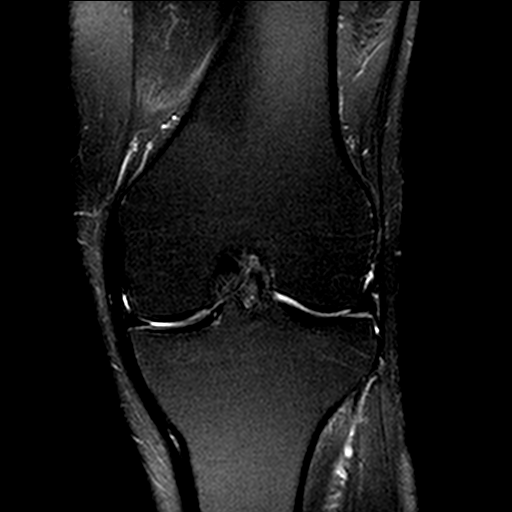
[im 13/20]
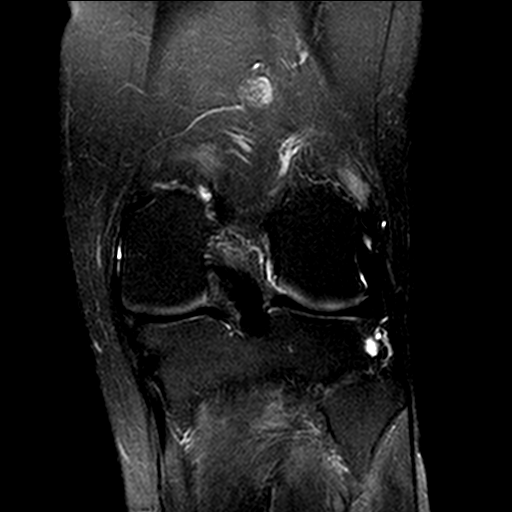
[im 16/20]
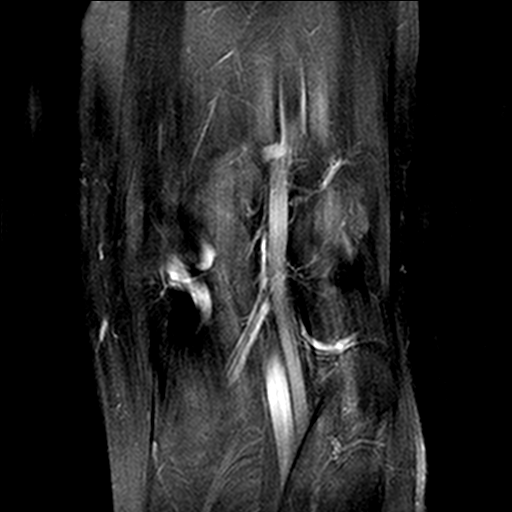
[im 20/20]
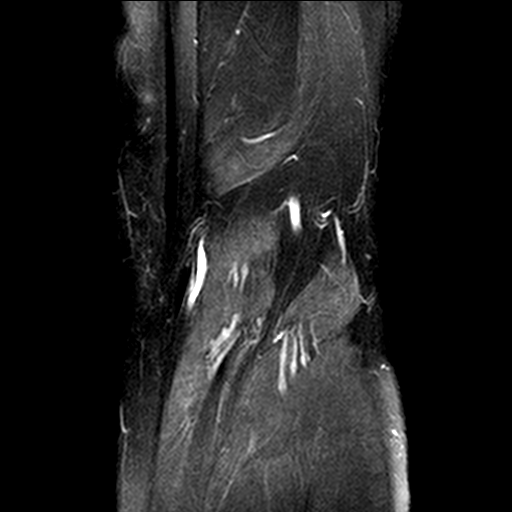

[Series 6: T1 · coronal · 4.5mm · 0.33mm/px · 1 of 20 slices shown]
[im 1/20]
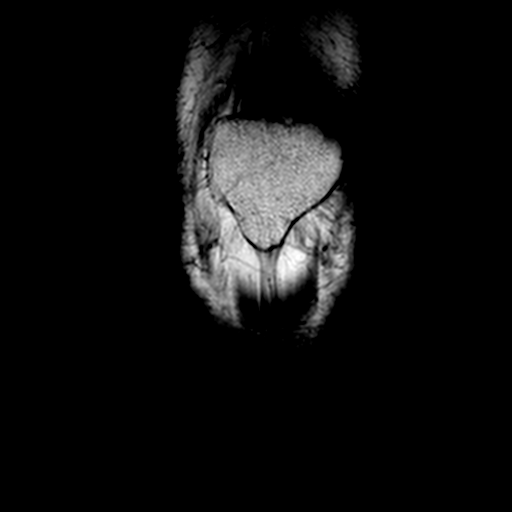

[24 of 40 positions shown; findings below may reference images not displayed]

IMPRESSION: 1.  No appreciable change since [DATE].  Small joint effusion and Baker?s cyst.
 3.  Changes suggesting partial meniscectomy at the anterior horn of the lateral meniscus.
 4.  Mild hyaline cartilage irregularity in the lateral compartment.  
 5.  Possible laxity of the ACL.  This is a diminutive structure showing abnormal signal and it is possible that this is a degenerated and poorly functioning ligament.

## 2006-07-06 IMAGING — CT CT ABDOMEN W/O CM
1 of 2 series · 15 of 32 positions shown, 20 images · IV contrast (GASTRO.)
Comparison: none

CLINICAL DATA: Upper abdominal pain.  History of MS.
 CT ABDOMEN WITHOUT CONTRAST:
 Multidetector helical scans through the abdomen were performed after only oral contrast was given.  Adequate IV access could not be achieved in this patient, therefore, this study was performed without IV contrast media.  Somewhat prominent pulmonary vascularity is noted in the lung bases.  No definite effusion is seen.  The liver appears normal in the unenhanced state.  No calcified gallstones are seen.  Pancreas is normal in size with normal peripancreatic fat planes.  The adrenal glands are normal as is the spleen.  Kidneys appear normal in the unenhanced state with no definite calculi.  Ureters appear normal in caliber.  The abdominal aorta is normal in size.  The portion of the appendix that is visualized appears normal.  No bowel distention is seen.

[Series 2: routine abdomen · axial · 0.74mm/px · z∈[-254,+61]mm · 15 of 70 slices shown, 20 images]
[im 4/70  soft-tissue]
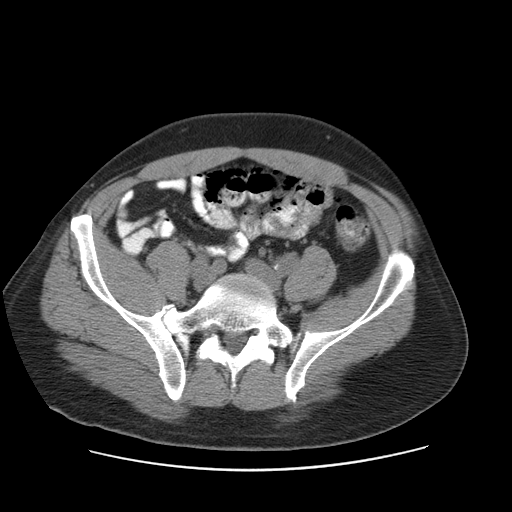
[im 4/70  bone]
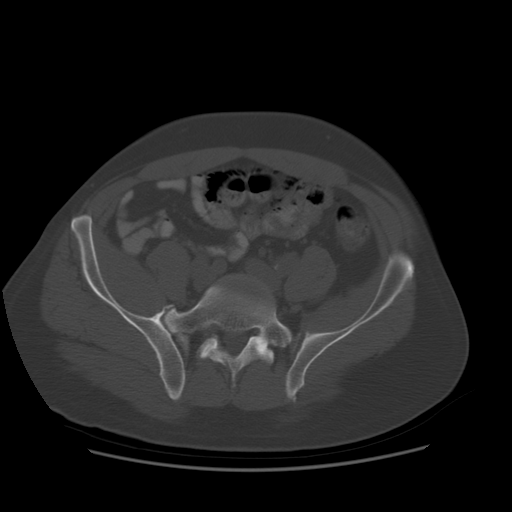
[im 10/70  soft-tissue]
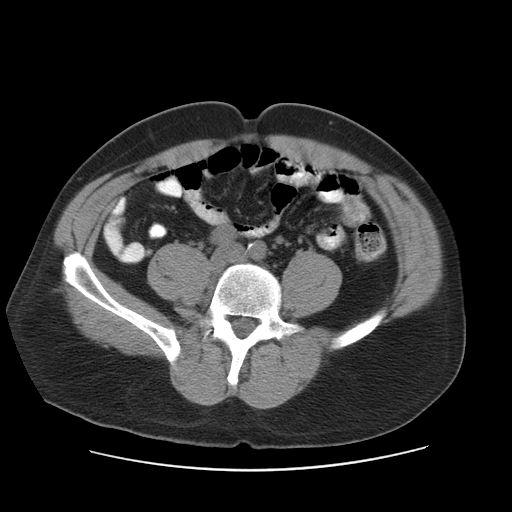
[im 13/70  soft-tissue]
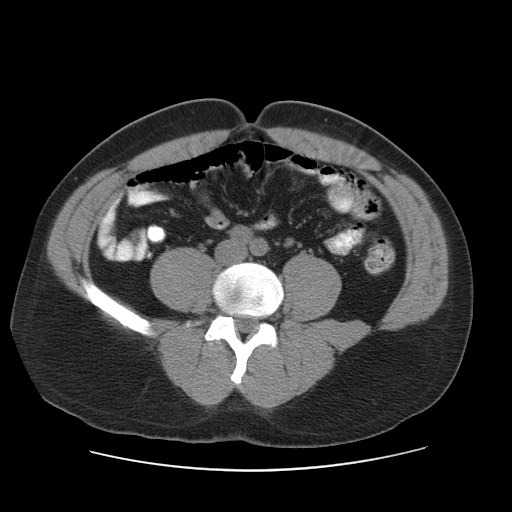
[im 19/70  soft-tissue]
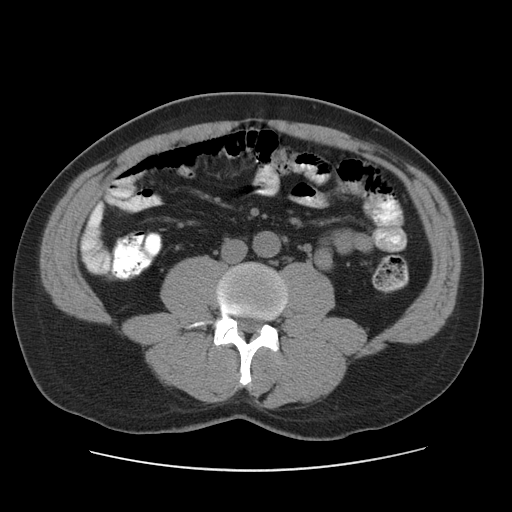
[im 25/70  soft-tissue]
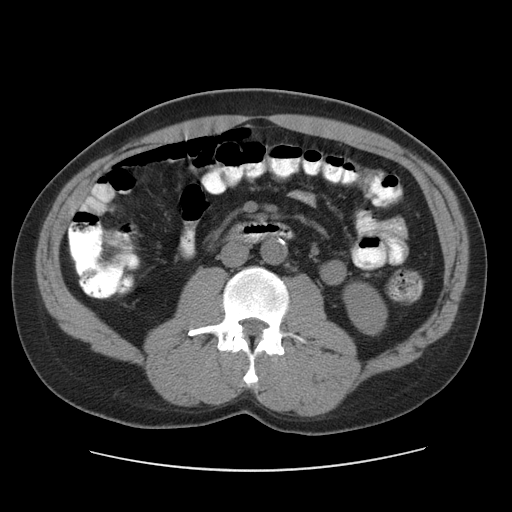
[im 28/70  soft-tissue]
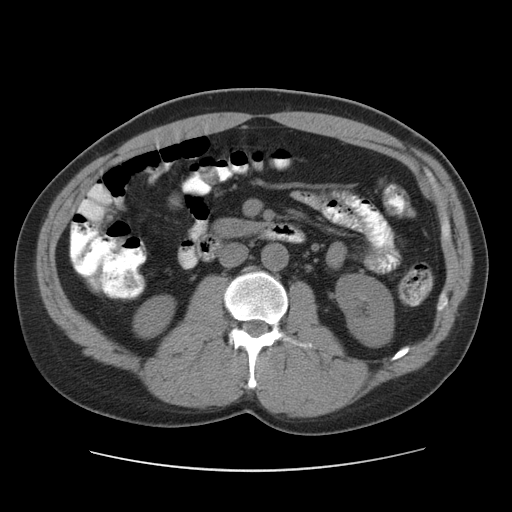
[im 34/70  soft-tissue]
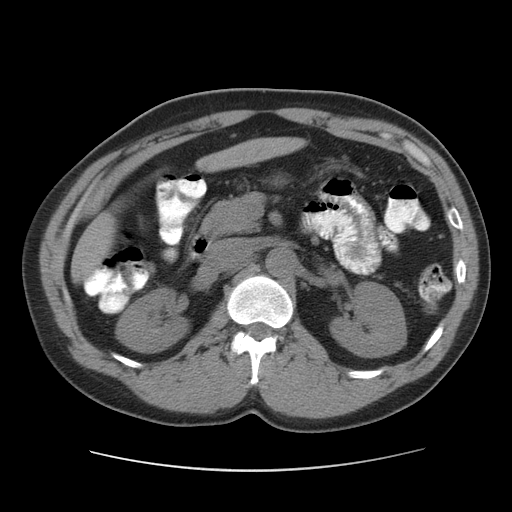
[im 37/70  soft-tissue]
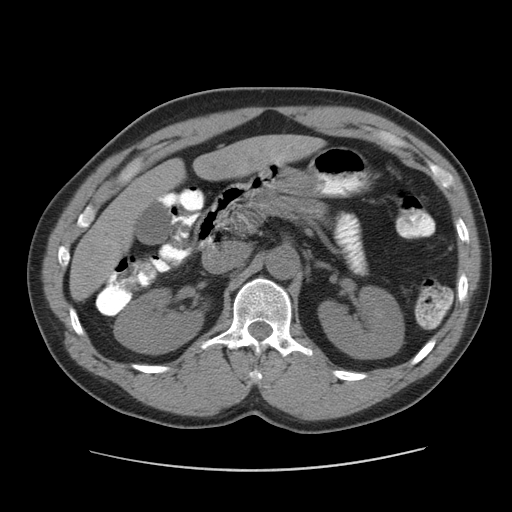
[im 43/70  soft-tissue]
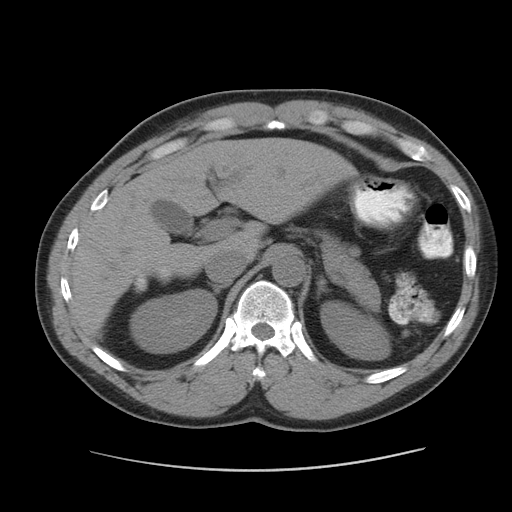
[im 43/70  bone]
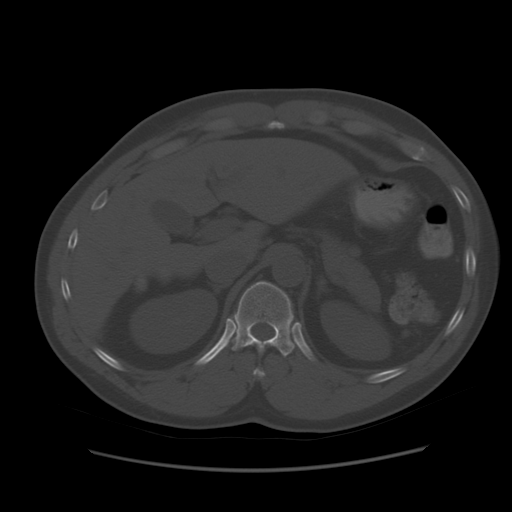
[im 46/70  soft-tissue]
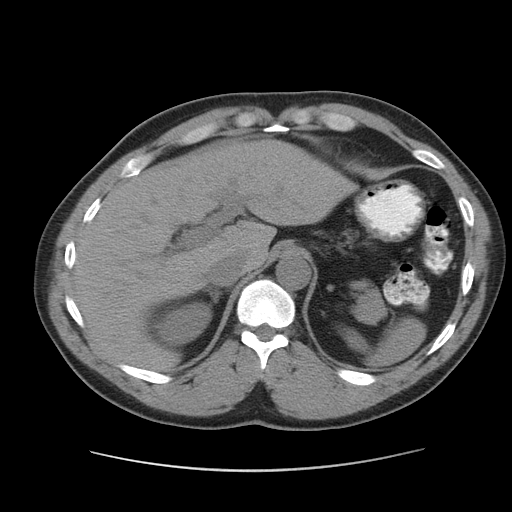
[im 52/70  soft-tissue]
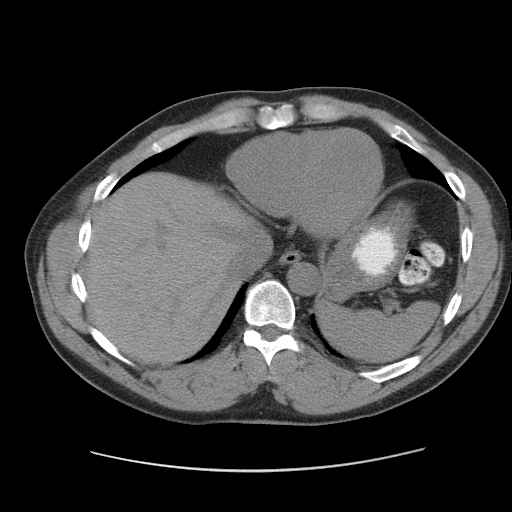
[im 58/70  soft-tissue]
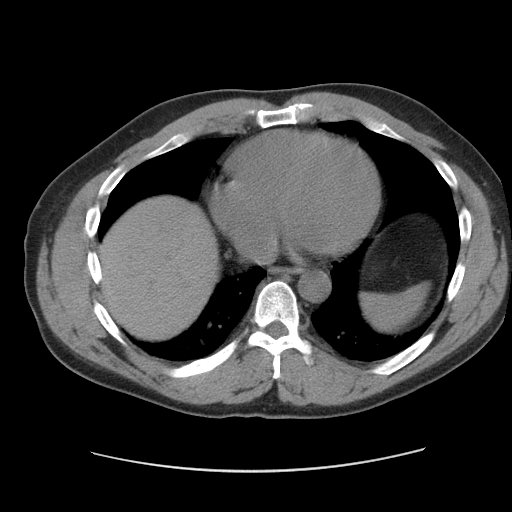
[im 58/70  lung]
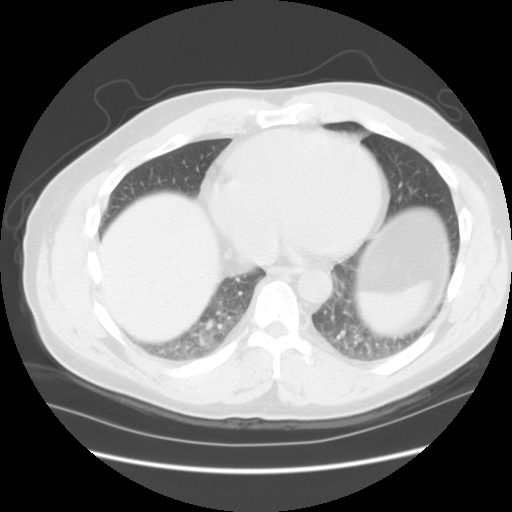
[im 61/70  soft-tissue]
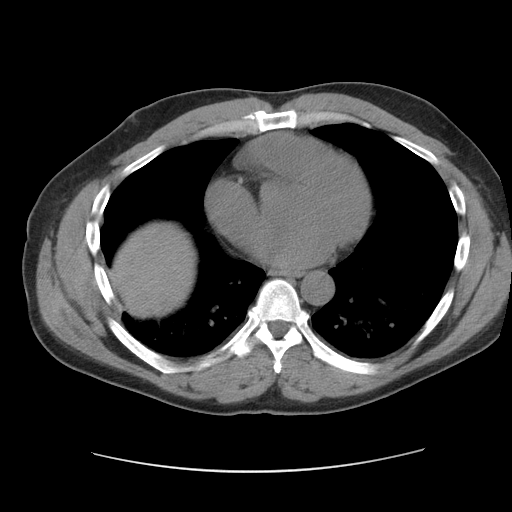
[im 61/70  lung]
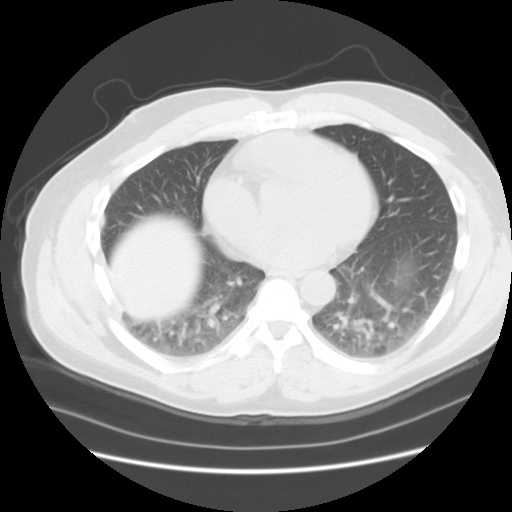
[im 64/70  lung]
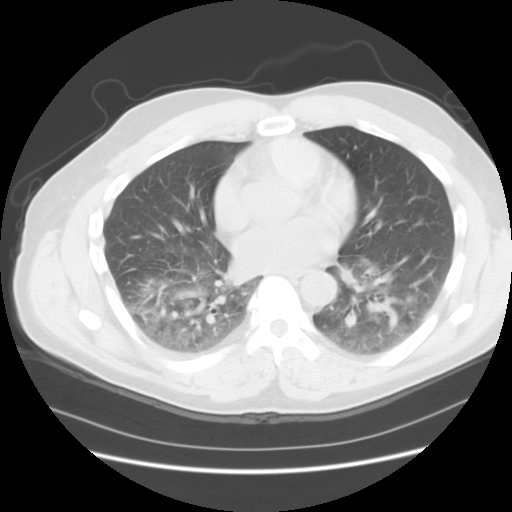
[im 67/70  soft-tissue]
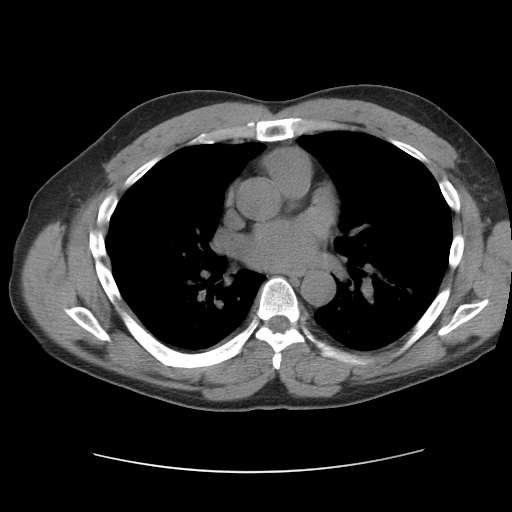
[im 67/70  lung]
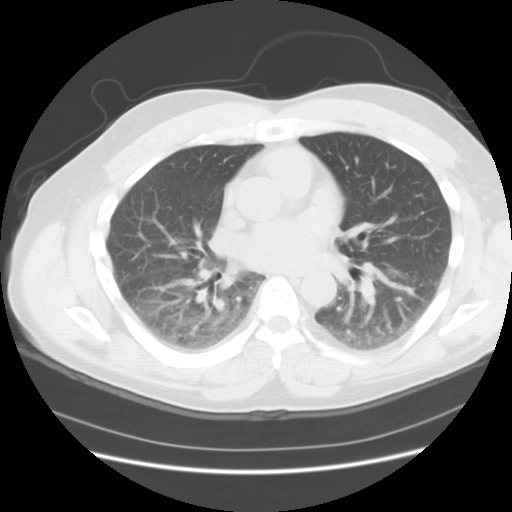

[15 of 32 positions shown; findings below may reference images not displayed]

IMPRESSION: Negative unenhanced CT of the abdomen.

## 2007-02-18 ENCOUNTER — Ambulatory Visit (HOSPITAL_COMMUNITY): Admission: RE | Admit: 2007-02-18 | Discharge: 2007-02-18 | Payer: Self-pay | Admitting: Neurology

## 2007-02-19 ENCOUNTER — Encounter (HOSPITAL_COMMUNITY): Admission: RE | Admit: 2007-02-19 | Discharge: 2007-04-22 | Payer: Self-pay | Admitting: Neurology

## 2007-03-27 ENCOUNTER — Encounter: Admission: RE | Admit: 2007-03-27 | Discharge: 2007-03-27 | Payer: Self-pay | Admitting: Neurology

## 2007-05-07 IMAGING — CR DG LUMBAR SPINE COMPLETE 4+V
5 series · 5 of 5 positions shown · non-contrast
Comparison: CT scout film from 02/11/05.

CLINICAL DATA: Mid and lower back pain. History of multiple sclerosis. 
THORACIC SPINE ? 3 VIEW:

[view not recorded (1 of 5)]
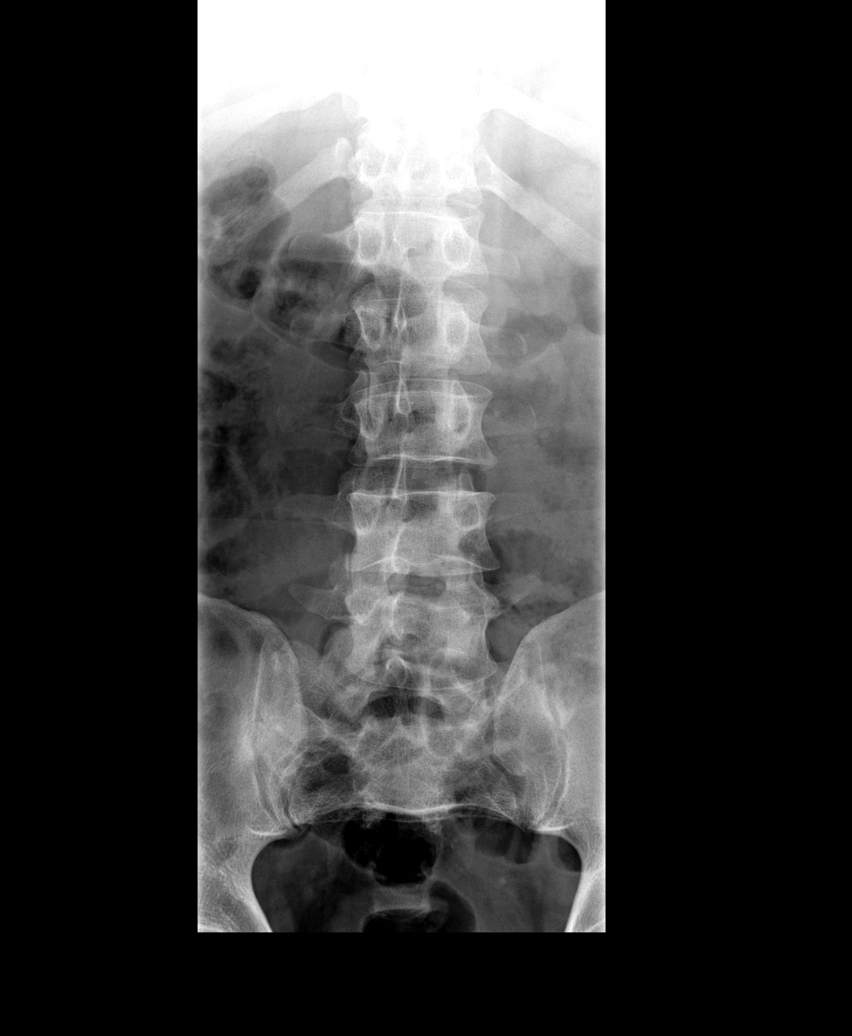

[view not recorded (2 of 5)]
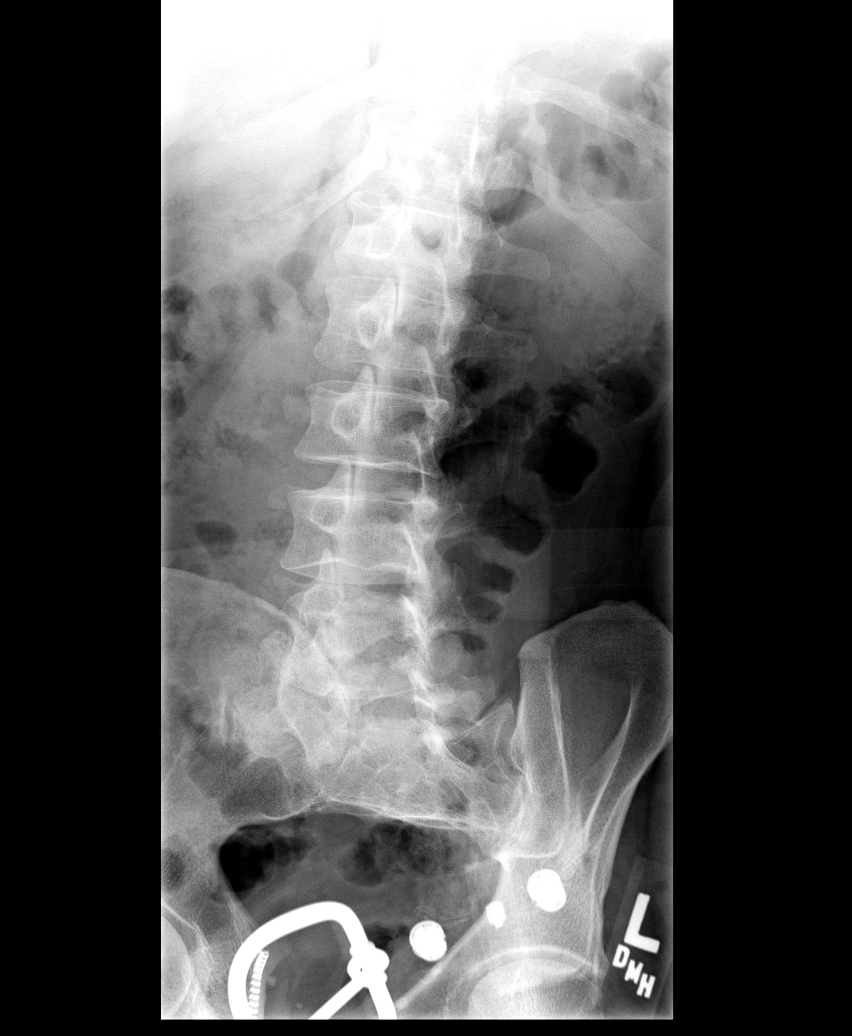

[view not recorded (3 of 5)]
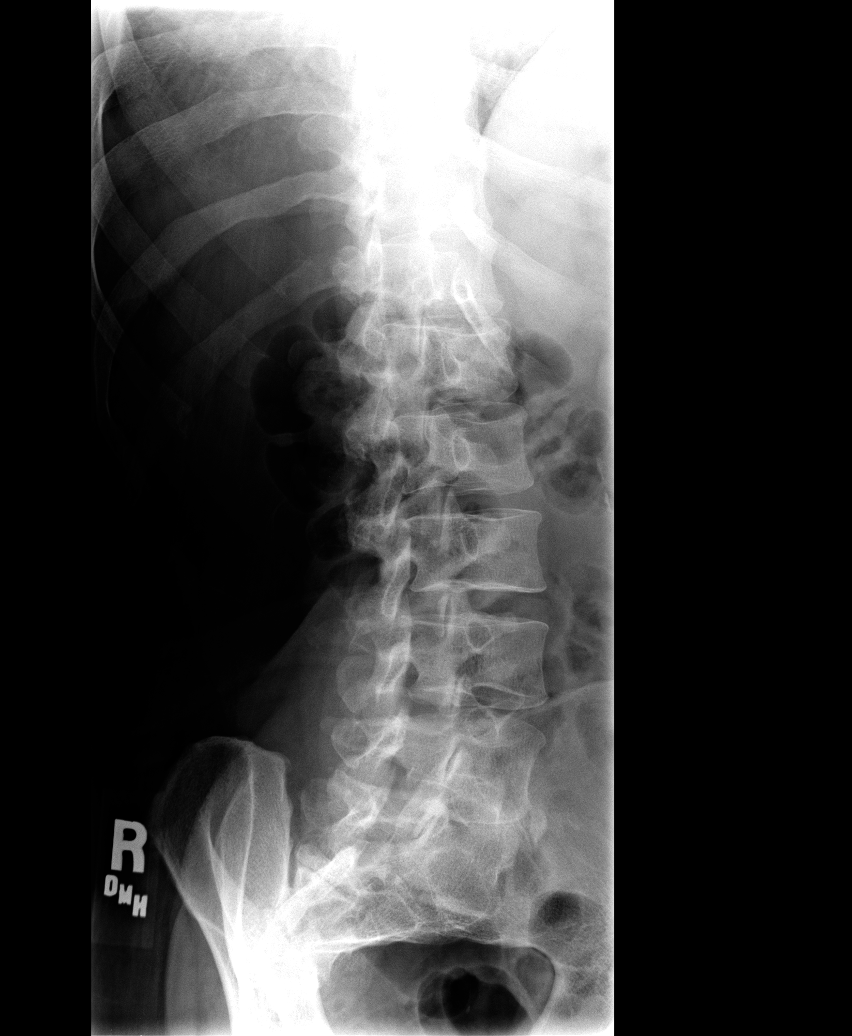

[view not recorded (4 of 5)]
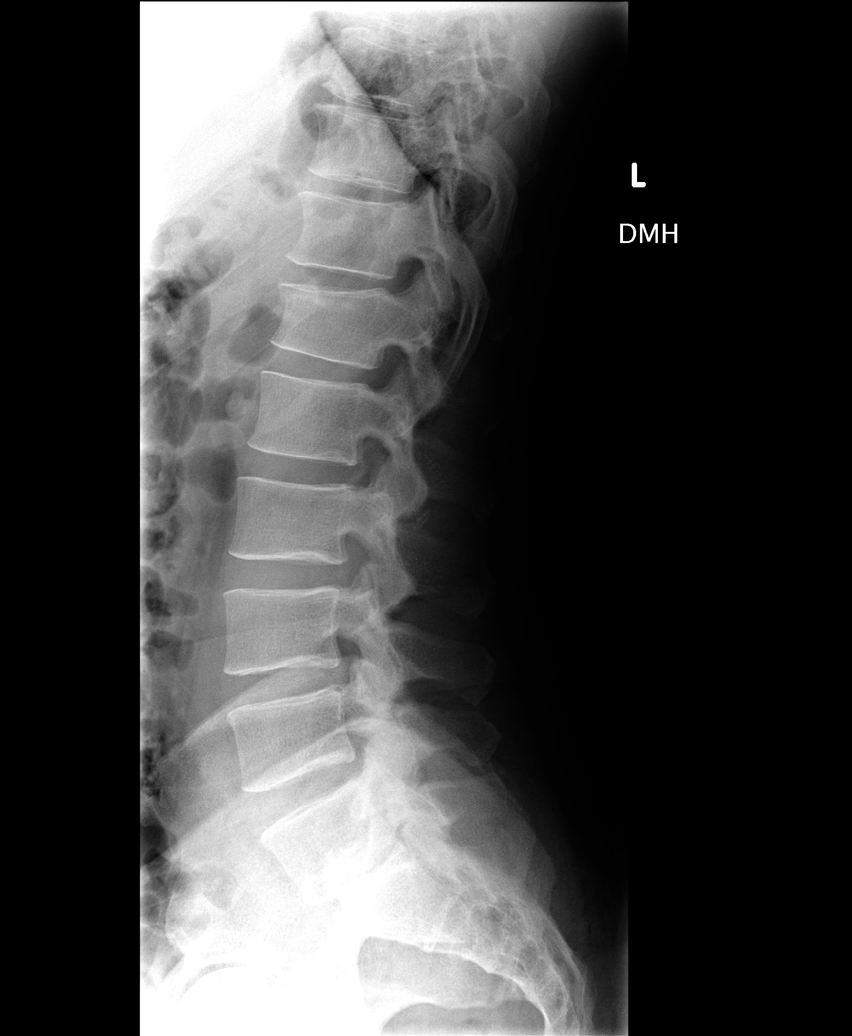

[view not recorded (5 of 5)]
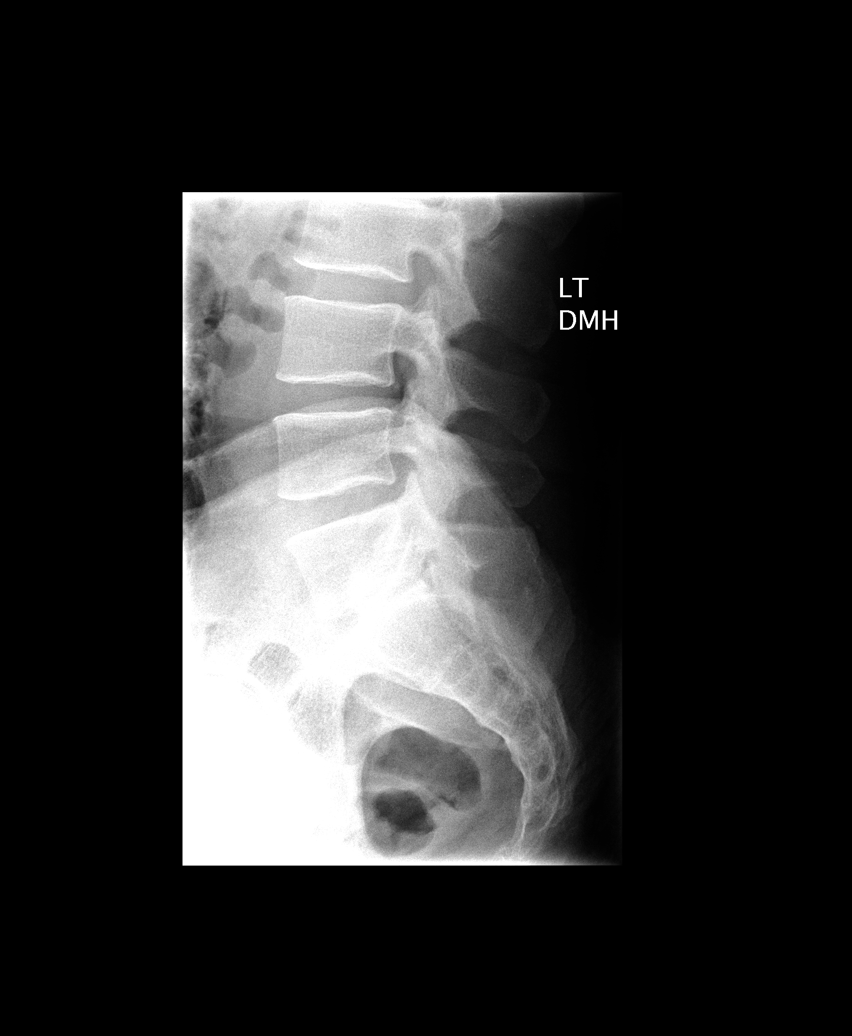

[5 of 5 positions shown; findings below may reference images not displayed]

FINDINGS: Thoracolumbar scoliosis is unchanged.  Mild spondylosis and degenerative disc disease in the mid/lower thoracic spine are identified.  There is no evidence of fracture of subluxation.
IMPRESSION: 1.  No acute abnormality.  
2.  Stable thoracolumbar scoliosis and mild degenerative disc disease and spondylosis.  
LUMBAR SPINE ? 5 VIEW:
FINDINGS: Five non-rib-bearing lumbar vertebrae are identified in normal alignment.  There is new mild compression of the L1 superior end plate.  No evidence of spondylolisthesis or spondylolysis.  Disc spaces are well maintained.
IMPRESSION: Mild compression of the L1 superior end plate of uncertain chronicity but new since 02/11/05.

## 2007-05-07 IMAGING — MR MR LUMBAR SPINE WO/W CM
7 of 18 series · 18 of 48 positions shown · IV contrast (magnevist)
Comparison: Plain films from earlier in the day.
COMPARISON: Plain films earlier in the day.

CLINICAL DATA: Multiple sclerosis.  Fell yesterday.  Complains of back pain.
MRI OF THORACIC SPINE WITHOUT AND WITH CONTRAST:
TECHNIQUE: Multiplanar and multiecho pulse sequences of the thoracic spine were obtained according to standard protocol before and after administration of intravenous contrast.
Contrast:  20 mL Magnevist.
TECHNIQUE: Multiplanar and multiecho pulse sequences of the lumbar spine, to include the lower thoracic region and upper sacral regions, were obtained according to standard protocol before and after administration of intravenous contrast.

[Series 3: T1 · sagittal · 3.0mm · 0.64mm/px · 2 of 12 slices shown (1 of 4)]
[im 1/12]
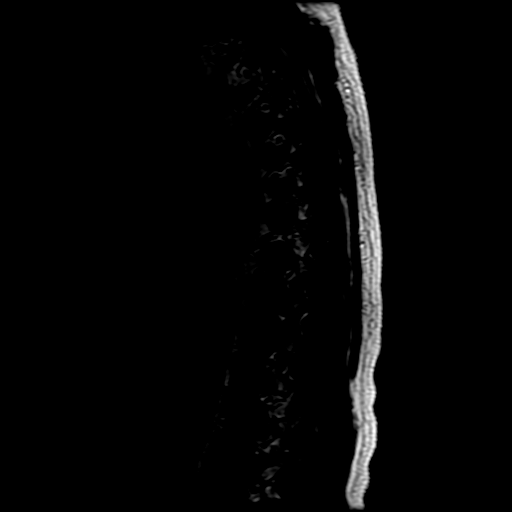
[im 12/12]
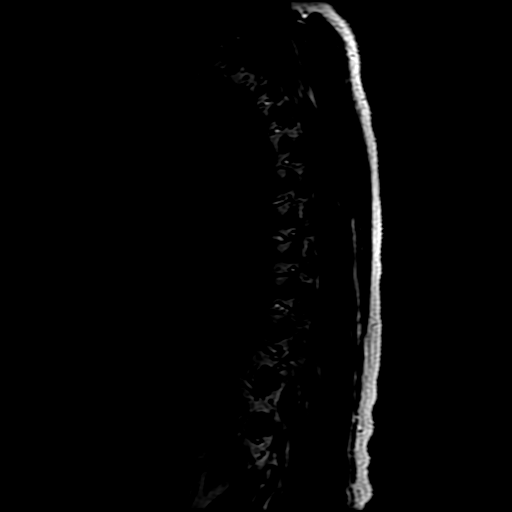

[Series 4: T2 · sagittal · 3.0mm · 0.64mm/px · 2 of 12 slices shown (1 of 3)]
[im 1/12]
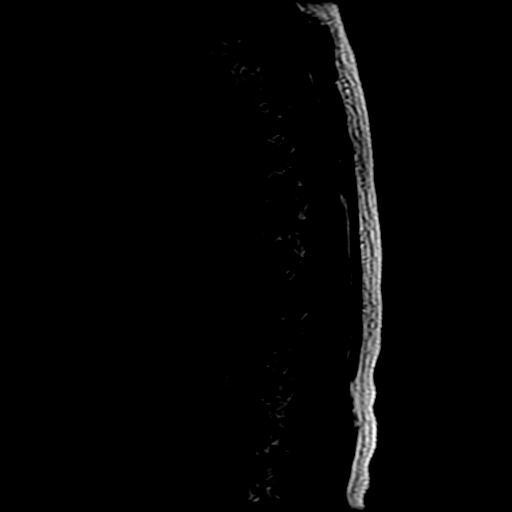
[im 12/12]
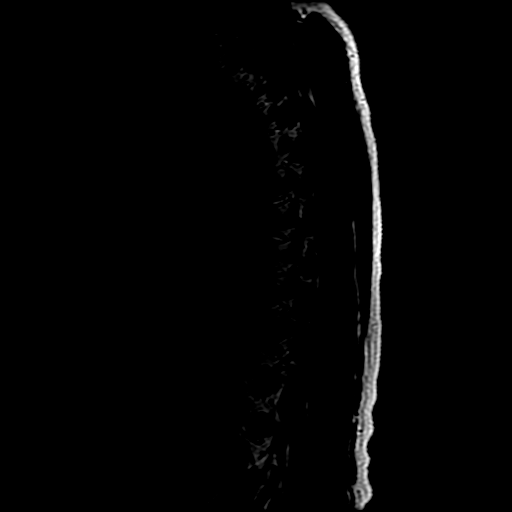

[Series 7: T1 · axial · 4.0mm · 0.43mm/px · z∈[-227,-7]mm · 2 of 13 slices shown (2 of 4)]
[im 1/13]
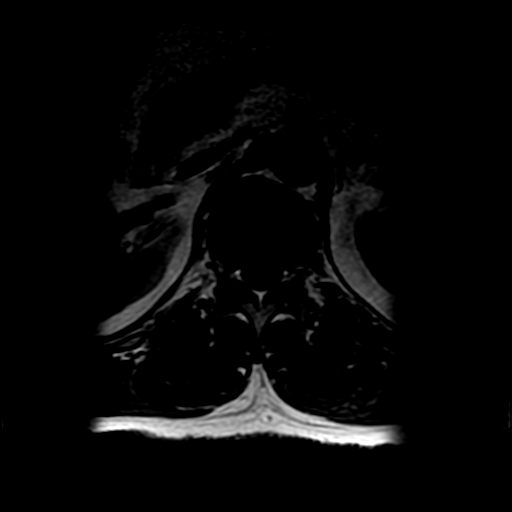
[im 13/13]
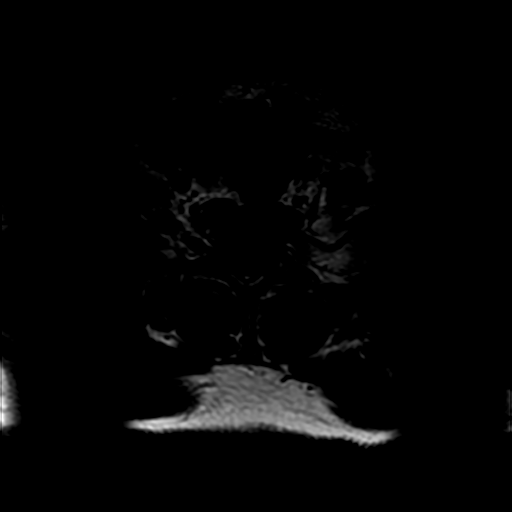

[Series 10: T1 · sagittal · 4.0mm · 0.57mm/px · 2 of 12 slices shown (3 of 4)]
[im 1/12]
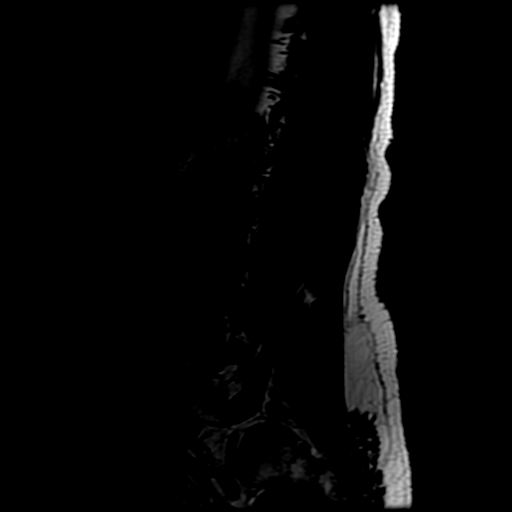
[im 12/12]
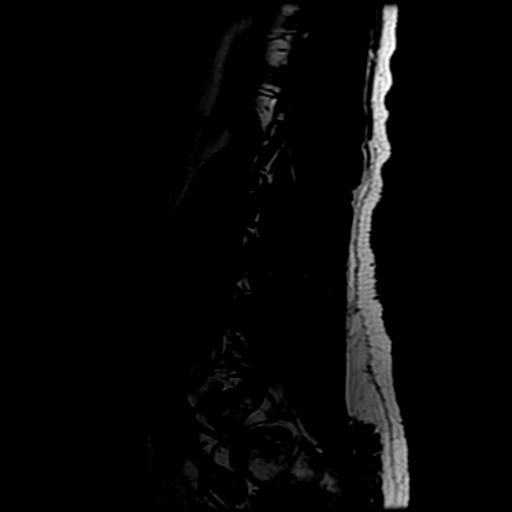

[Series 11: T2 · sagittal · 4.0mm · 0.57mm/px · 2 of 12 slices shown (2 of 3)]
[im 1/12]
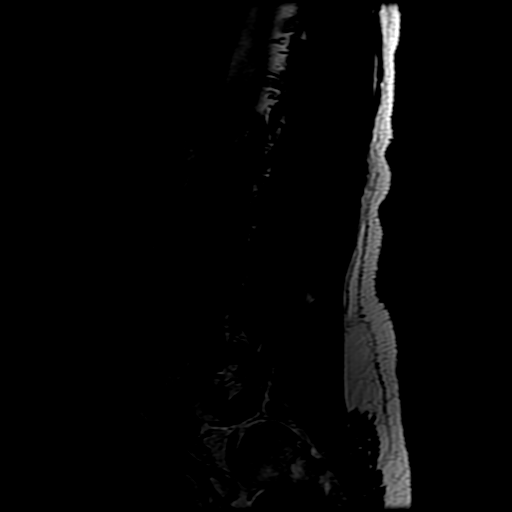
[im 12/12]
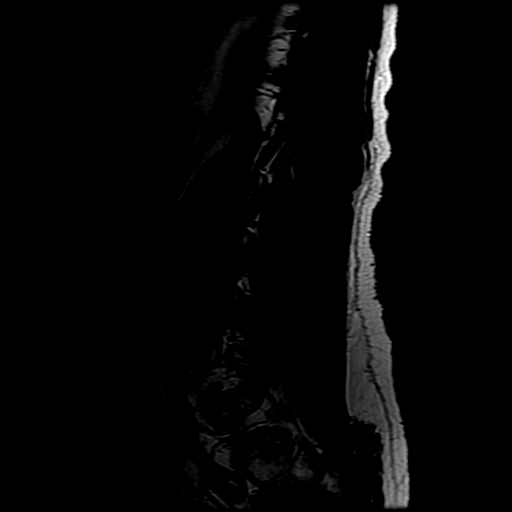

[Series 13: T2 · oblique · 4.0mm · 0.35mm/px · 4 of 28 slices shown (3 of 3)]
[im 1/28]
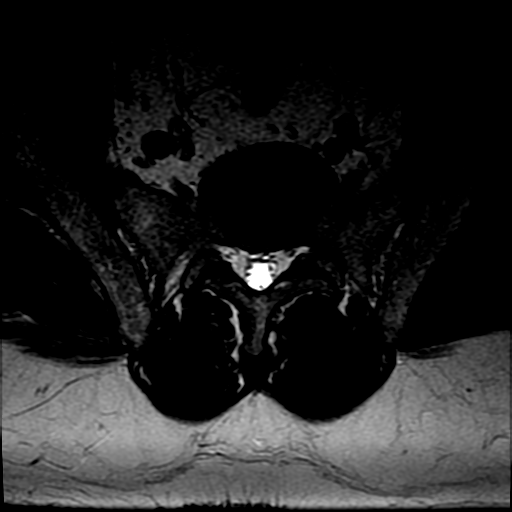
[im 10/28]
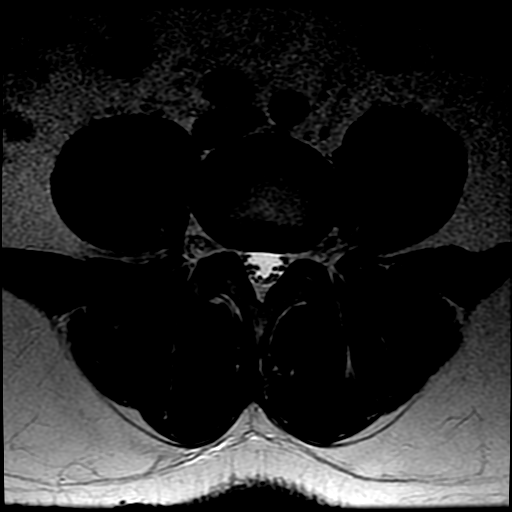
[im 19/28]
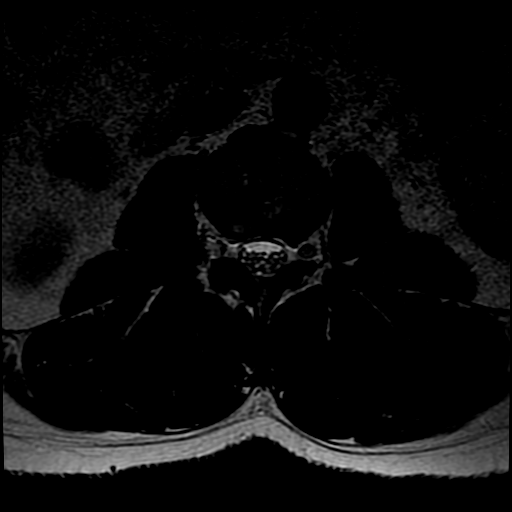
[im 28/28]
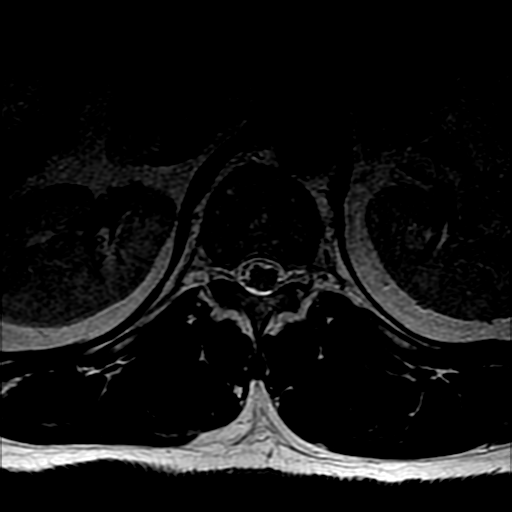

[Series 14: T1 · oblique · 4.0mm · 0.39mm/px · 4 of 28 slices shown (4 of 4)]
[im 1/28]
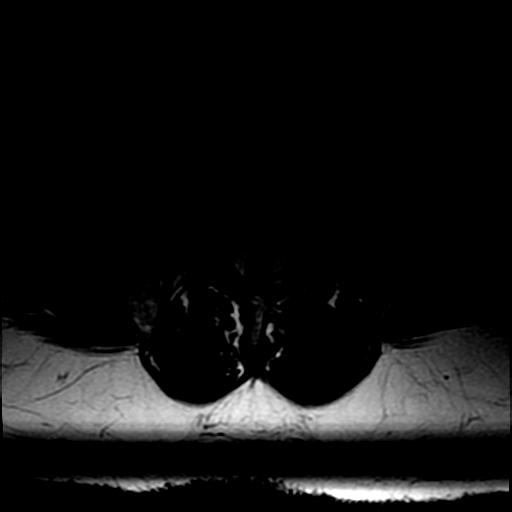
[im 10/28]
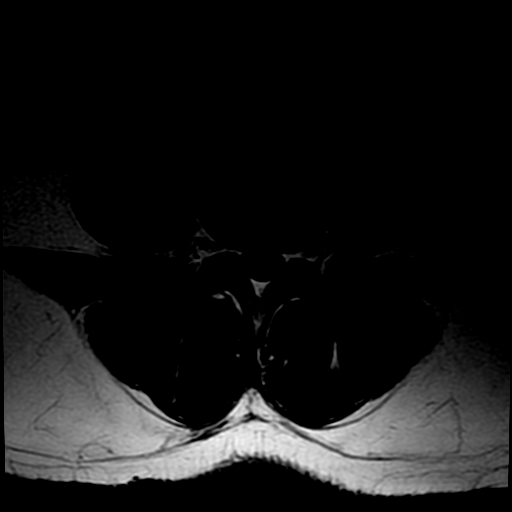
[im 19/28]
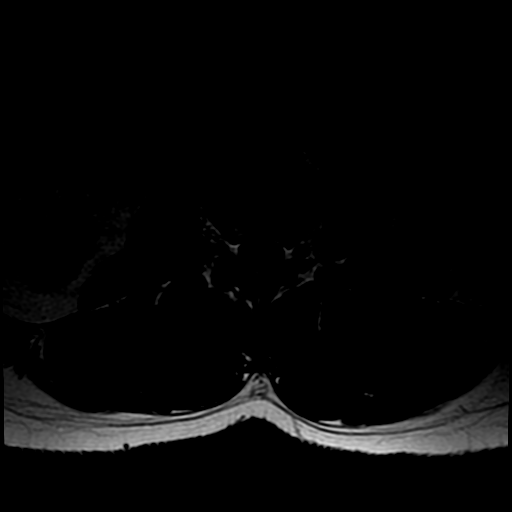
[im 28/28]
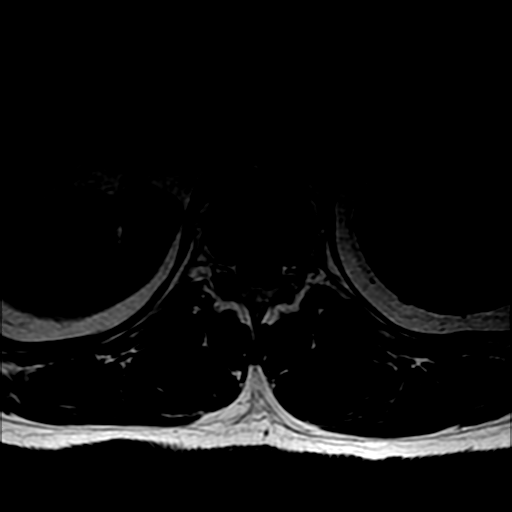

[18 of 48 positions shown; findings below may reference images not displayed]

FINDINGS: Number of the thoracic spine was accomplished by counting down from the dens.  
The anterior vertebral bodies demonstrate normal signal and alignment.  There are no thoracic disc protrusions.  The cord is a normal size throughout.  Opposite T12, there is a very tiny area of slight abnormal signal dorsally within the cord seen only on the sagittal STIR images.  I am unable to confirm this as an intramedullary lesion or MS plaque based on the axial images, and this therefore may be artifactual.  
There is very mild edema of the T11 spinous process posteriorly as well as the interspinous ligament between T11 and T12.  I suspect this is posttraumatic.  I do not see a vertebral body compression fracture in the thoracic spine, however.  Post infusion, there is no significant abnormal enhancement.
IMPRESSION: 1.  Equivocal distal thoracic cord lesion seen only on sagittal images; because I cannot confirm it on axial images, this is not definite for MS in the cord. 
2.  Slight contusion of the T11 spinous process and strain of the interspinous ligament between T11 and T12.
MRI LUMBAR SPINE WITHOUT AND WITH CONTRAST:
FINDINGS: Transitional anatomy is present.  The S1-S2 disc space is fairly well-developed.  This study will be numbered as if there are five lumbar vertebrae in accordance with numbering on plain films.  
There is an acute compression fracture of L1 with slight superior endplate depression.  Loss of vertebral body height is less than 25%.  There does not appear to be involvement of the posterior elements or pedicles.  I see no epidural hematoma.  There is no retropulsion of bone.  The disc spaces above and below, as well as throughout the lumbar spine, are intact.  Incidental note is made of mild lower lumbar facet arthropathy at L4-5 and L5-S1.  The conus medullaris is within normal limits ending at L1.  There are no convincing areas of signal abnormality in the lumbar conus to suggest MS.
IMPRESSION: 1.  Acute compression fracture of L1 with mild wedging, but no significant retropulsion of bone or epidural hematoma. 
2.  No disc protrusion, spinal stenosis, or abnormal signal within the conus medullaris.

## 2007-05-07 IMAGING — CR DG CHEST 1V PORT
1 series · 1 of 1 positions shown · non-contrast
Comparison: 12/30/04
 Portable view of the chest shows the lungs to be clear.  The heart is within upper limits of normal.  No bony abnormality is seen.

CLINICAL DATA: Shortness of breath.  Compression fracture.
 PORTABLE CHEST- 1 VIEW:

[view not recorded]
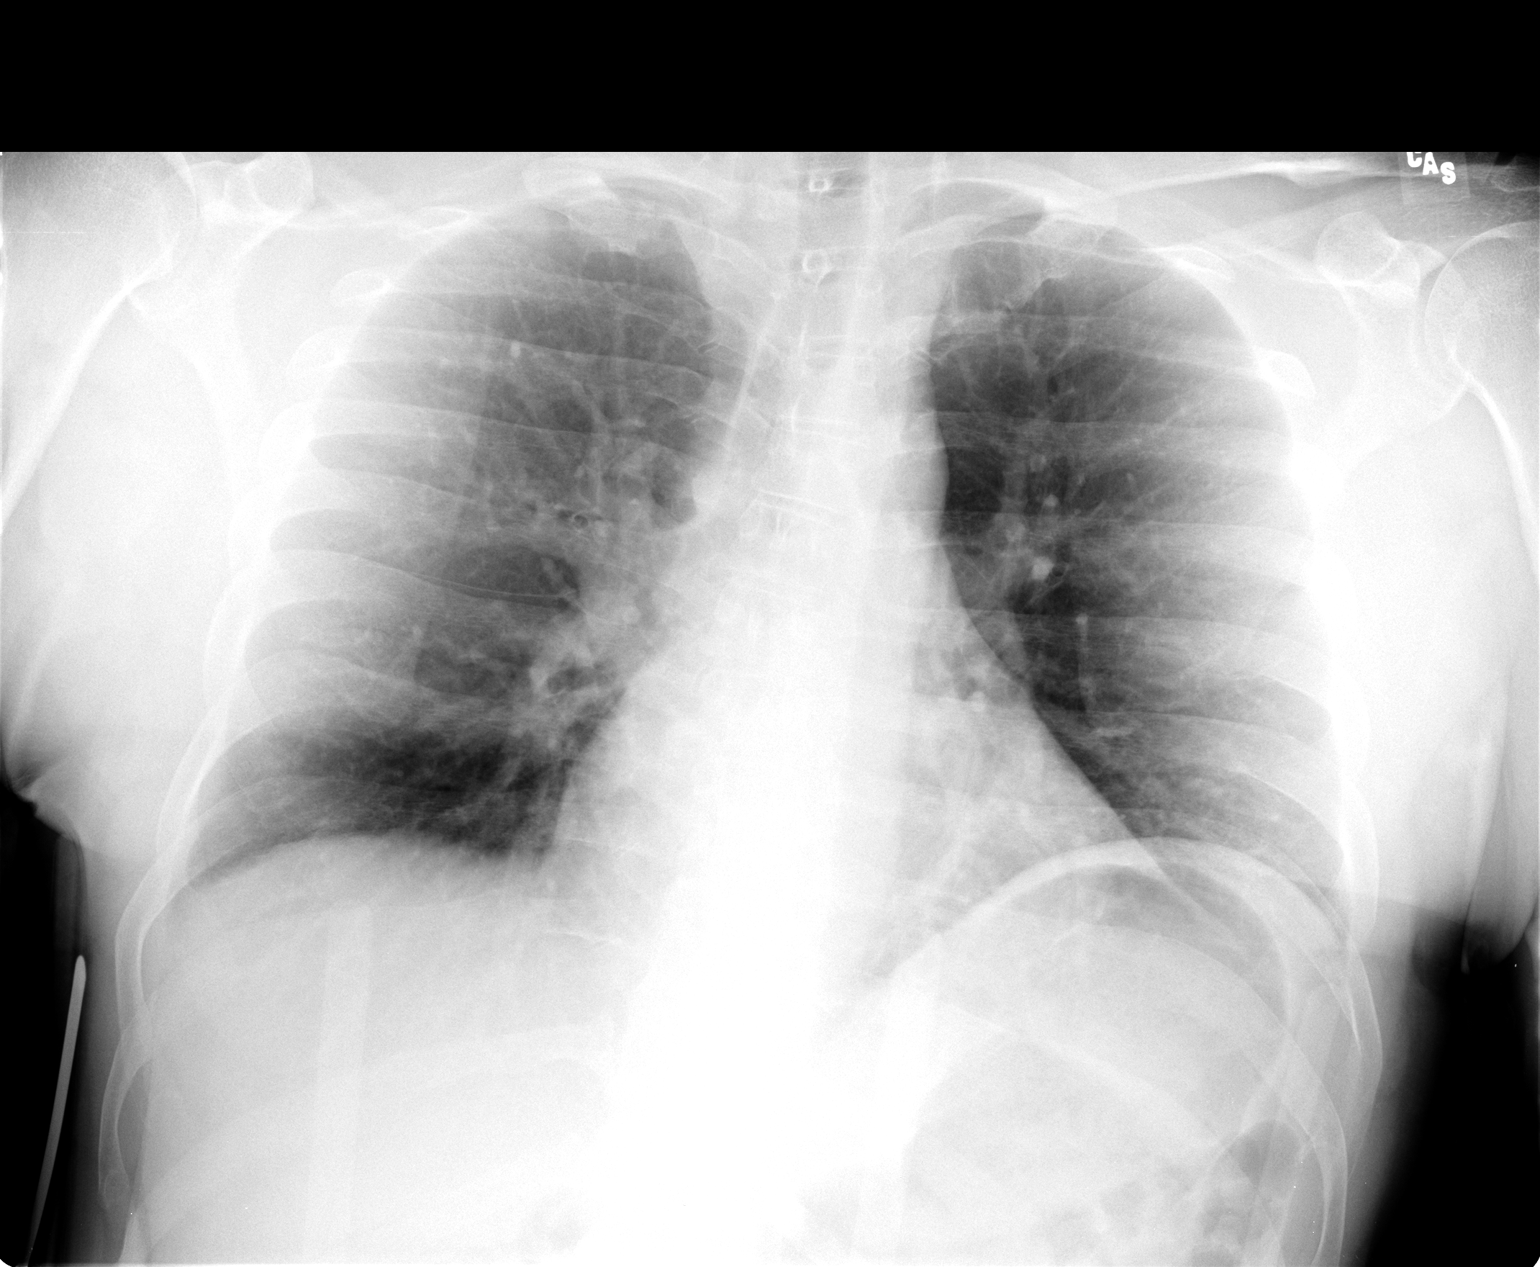

[1 of 1 positions shown; findings below may reference images not displayed]

IMPRESSION: No active lung disease.

## 2007-05-07 IMAGING — CR DG THORACIC SPINE 2V
3 series · 3 of 3 positions shown · non-contrast
Comparison: CT scout film from 02/11/05.

CLINICAL DATA: Mid and lower back pain. History of multiple sclerosis. 
THORACIC SPINE ? 3 VIEW:

[view not recorded (1 of 3)]
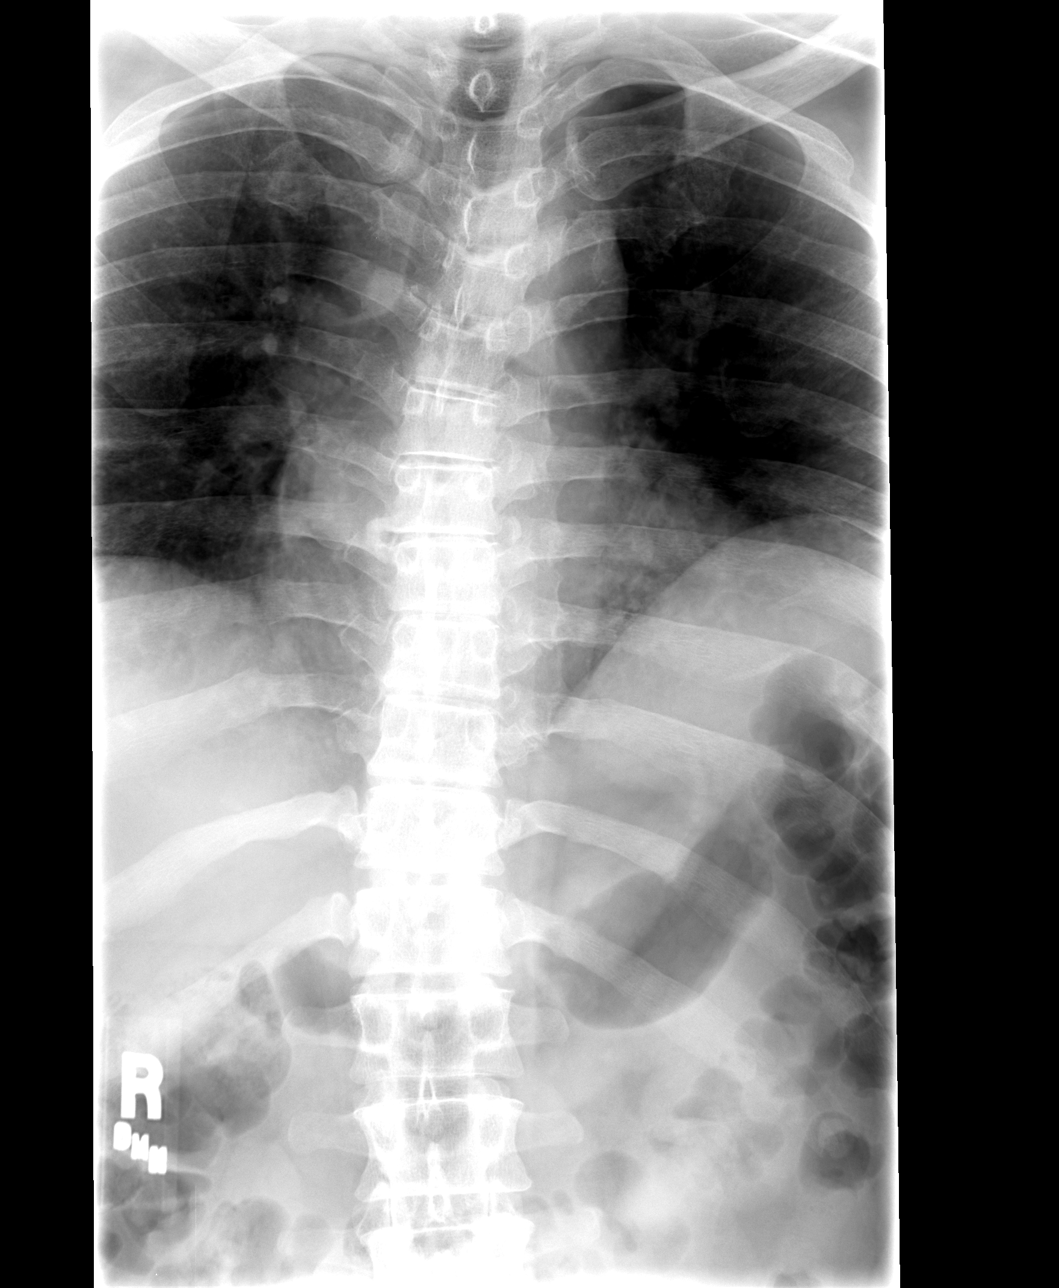

[view not recorded (2 of 3)]
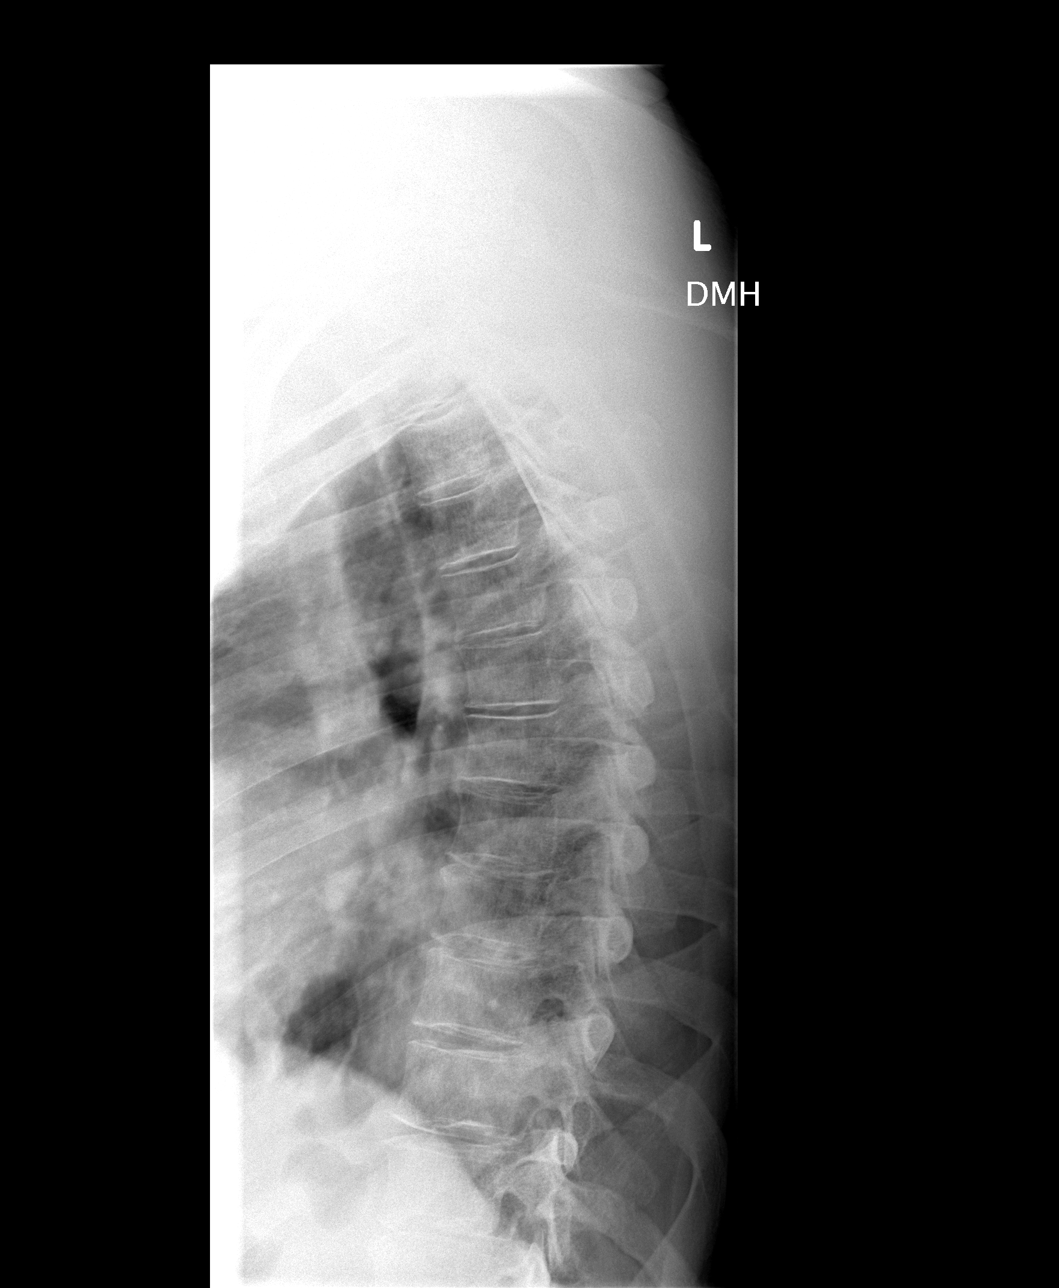

[view not recorded (3 of 3)]
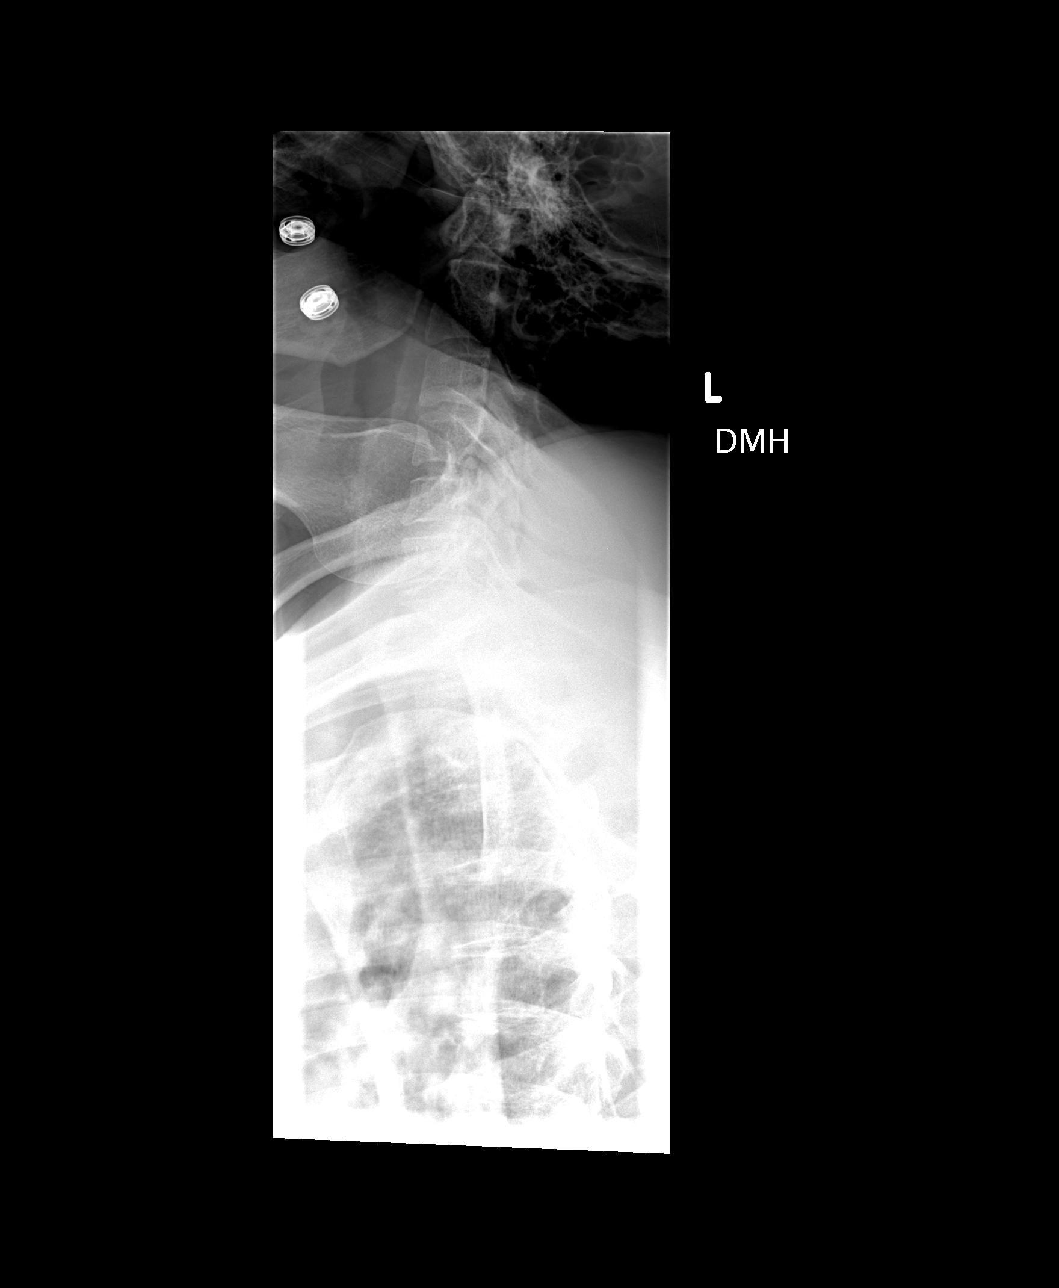

[3 of 3 positions shown; findings below may reference images not displayed]

FINDINGS: Thoracolumbar scoliosis is unchanged.  Mild spondylosis and degenerative disc disease in the mid/lower thoracic spine are identified.  There is no evidence of fracture of subluxation.
IMPRESSION: 1.  No acute abnormality.  
2.  Stable thoracolumbar scoliosis and mild degenerative disc disease and spondylosis.  
LUMBAR SPINE ? 5 VIEW:
FINDINGS: Five non-rib-bearing lumbar vertebrae are identified in normal alignment.  There is new mild compression of the L1 superior end plate.  No evidence of spondylolisthesis or spondylolysis.  Disc spaces are well maintained.
IMPRESSION: Mild compression of the L1 superior end plate of uncertain chronicity but new since 02/11/05.

## 2007-06-01 ENCOUNTER — Encounter (INDEPENDENT_AMBULATORY_CARE_PROVIDER_SITE_OTHER): Payer: Self-pay | Admitting: Neurology

## 2007-06-01 ENCOUNTER — Ambulatory Visit: Payer: Self-pay | Admitting: Vascular Surgery

## 2007-06-01 ENCOUNTER — Inpatient Hospital Stay (HOSPITAL_COMMUNITY): Admission: EM | Admit: 2007-06-01 | Discharge: 2007-06-04 | Payer: Self-pay | Admitting: Emergency Medicine

## 2007-06-03 ENCOUNTER — Ambulatory Visit: Payer: Self-pay | Admitting: Physical Medicine & Rehabilitation

## 2007-06-04 ENCOUNTER — Inpatient Hospital Stay (HOSPITAL_COMMUNITY)
Admission: RE | Admit: 2007-06-04 | Discharge: 2007-06-10 | Payer: Self-pay | Admitting: Physical Medicine & Rehabilitation

## 2007-06-04 ENCOUNTER — Ambulatory Visit: Payer: Self-pay | Admitting: Physical Medicine & Rehabilitation

## 2007-06-14 ENCOUNTER — Encounter
Admission: RE | Admit: 2007-06-14 | Discharge: 2007-07-21 | Payer: Self-pay | Admitting: Physical Medicine & Rehabilitation

## 2007-06-29 ENCOUNTER — Emergency Department (HOSPITAL_COMMUNITY): Admission: EM | Admit: 2007-06-29 | Discharge: 2007-06-29 | Payer: Self-pay | Admitting: Emergency Medicine

## 2007-07-05 ENCOUNTER — Emergency Department (HOSPITAL_COMMUNITY): Admission: EM | Admit: 2007-07-05 | Discharge: 2007-07-05 | Payer: Self-pay | Admitting: Emergency Medicine

## 2007-07-22 IMAGING — MR MR PELVIS W/O CM
4 series · 10 of 48 positions shown · IV contrast (agent unspecified)
Comparison: Pelvic and hip radiographs 07/30/04 from [email or web address]]Findings:  There is no hip joint effusion or paraarticular fluid collection.

CLINICAL DATA: Bilateral hip pain.  Question synovitis. 
 MRI PELVIS WITHOUT CONTRAST:
TECHNIQUE: Multiplanar, multisequence MR imaging of the pelvis was performed following the standard protocol.  No intravenous contrast was administered.

[Series 3: T1 · coronal · 5.0mm · 0.47mm/px · 3 of 25 slices shown (1 of 2)]
[im 3/25]
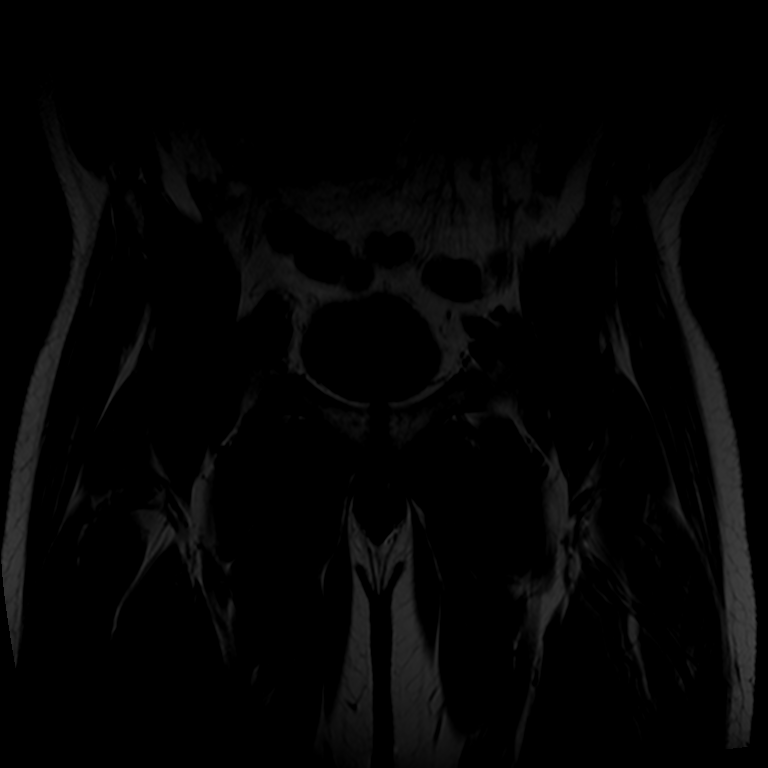
[im 14/25]
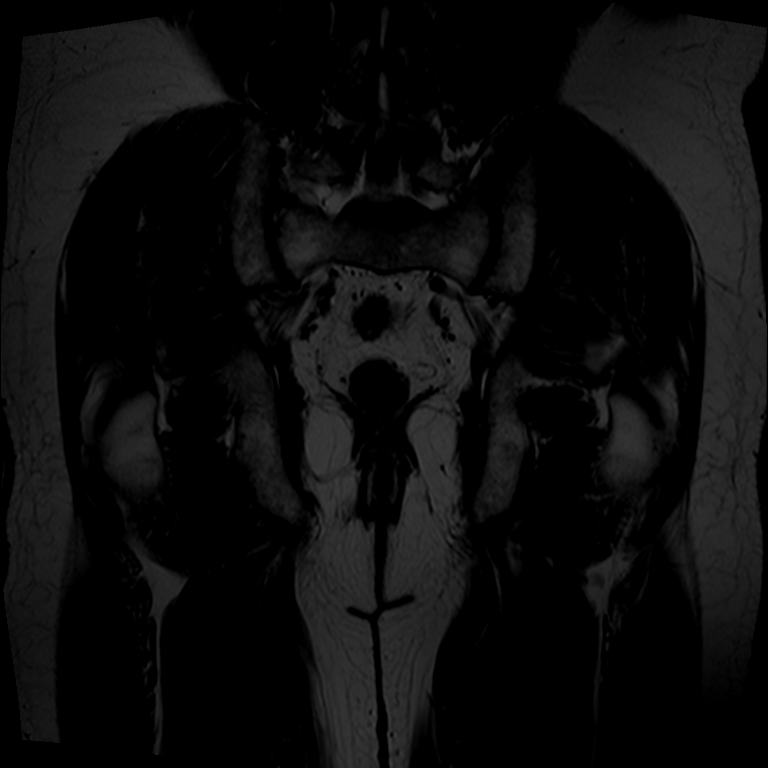
[im 22/25]
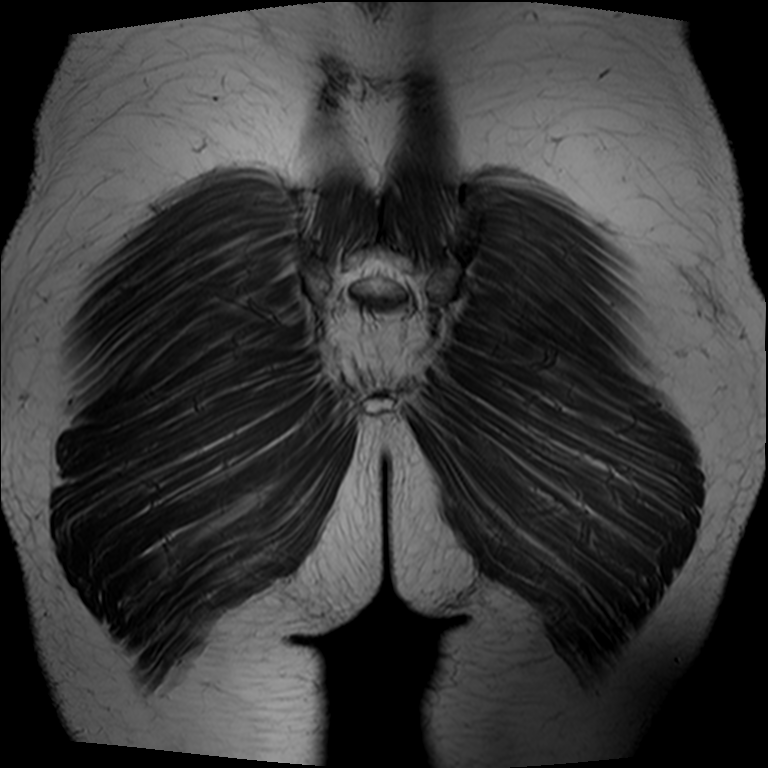

[Series 4: STIR · coronal · 5.0mm · 0.70mm/px · 3 of 25 slices shown]
[im 3/25]
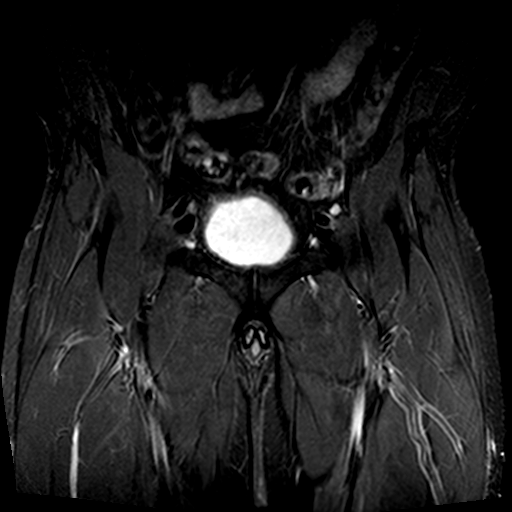
[im 14/25]
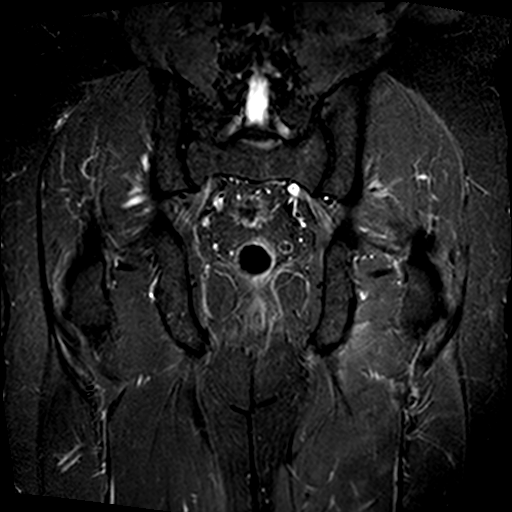
[im 22/25]
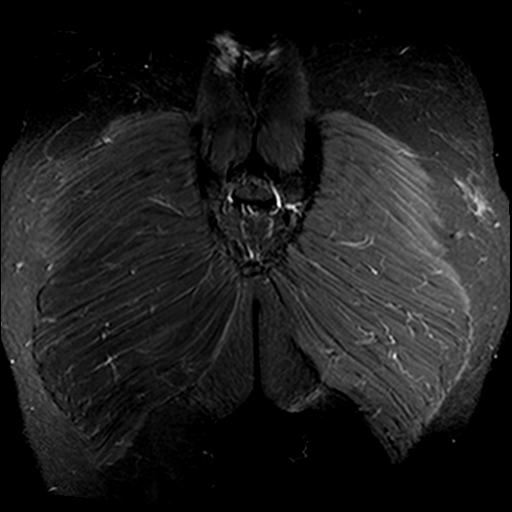

[Series 5: T1 · axial · 6.0mm · 0.47mm/px · z∈[-31,+149]mm · 3 of 33 slices shown (2 of 2)]
[im 5/33]
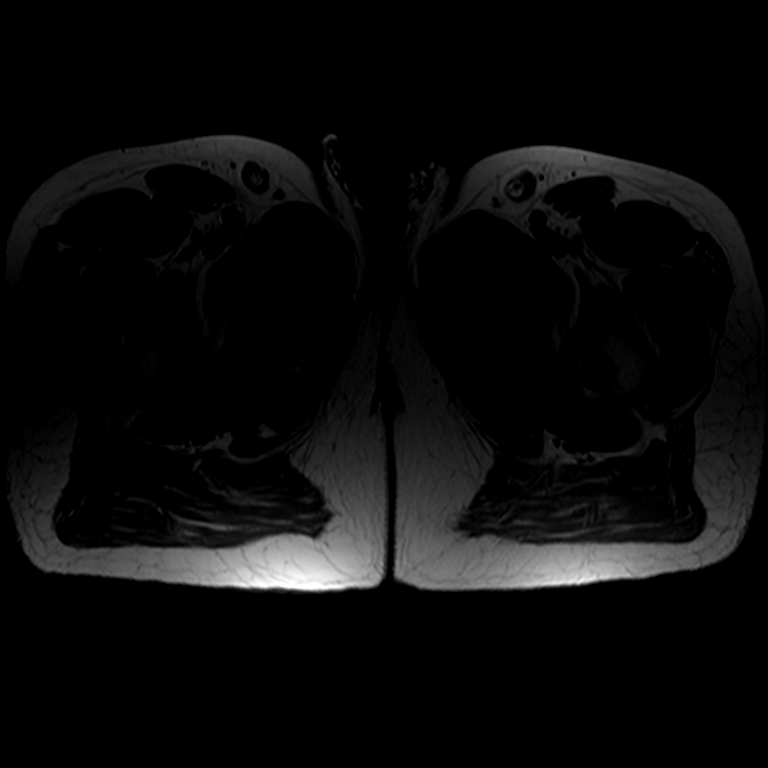
[im 18/33]
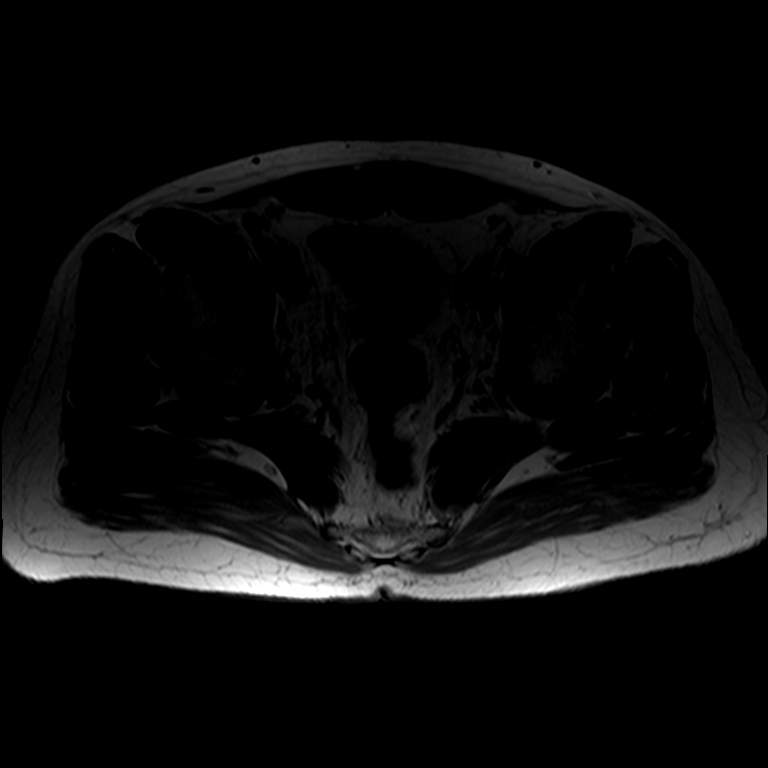
[im 28/33]
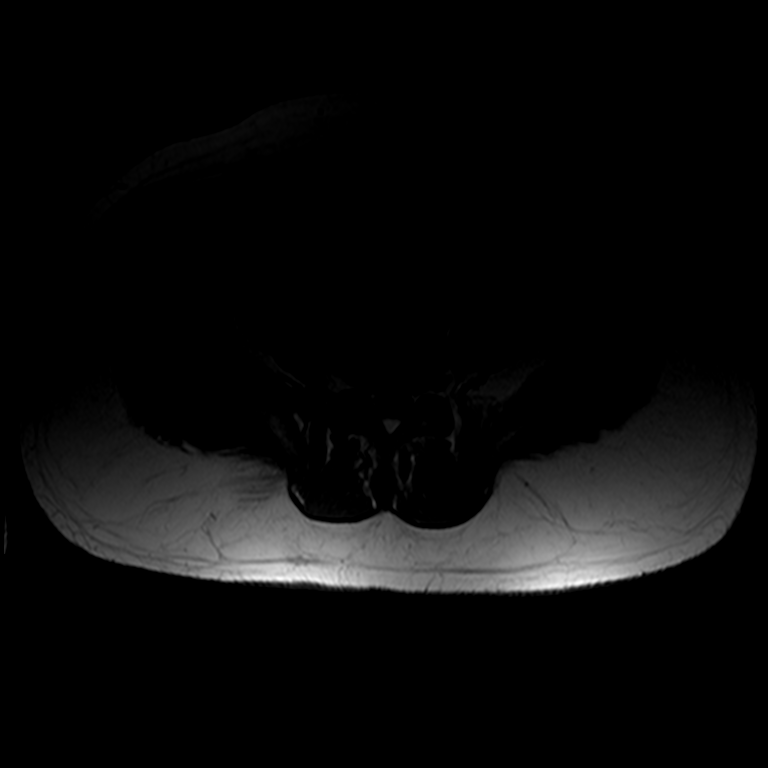

[Series 6: PD · axial · 6.0mm · 0.48mm/px · 1 of 33 slices shown]
[im 5/33]
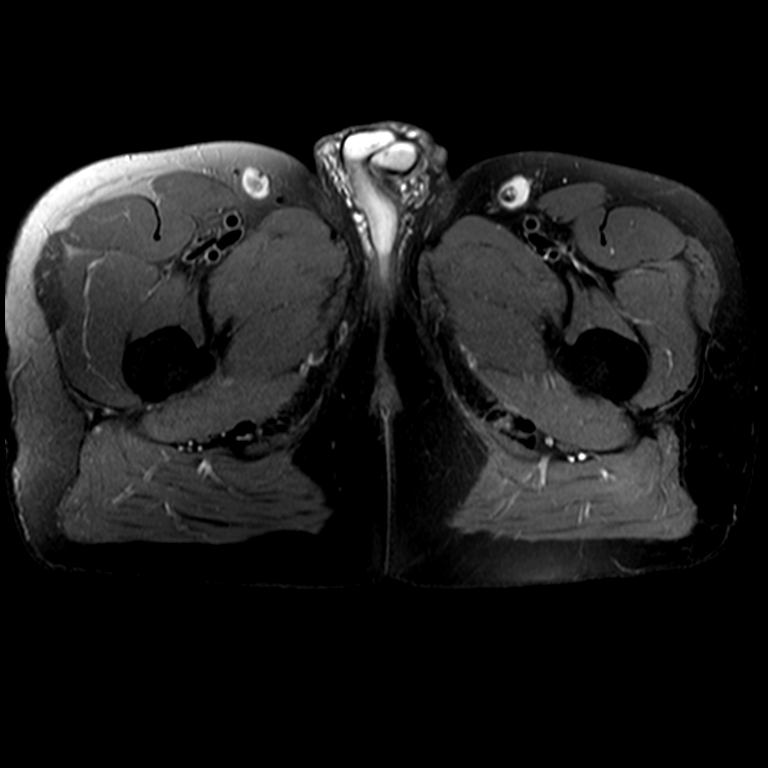

[10 of 48 positions shown; findings below may reference images not displayed]

Both femoral heads appear normal.  There is no evidence of femoral head osteonecrosis.  No obvious labral tear is demonstrated by this screening examination.  There is no abnormality of the bony pelvis.  The paraarticular musculature and tendons appear symmetric with normal signal.
IMPRESSION: Normal MR of the pelvis with attention to the hip joints.  There is no evidence of hip joint effusion.

## 2007-09-01 ENCOUNTER — Emergency Department (HOSPITAL_COMMUNITY): Admission: EM | Admit: 2007-09-01 | Discharge: 2007-09-01 | Payer: Self-pay | Admitting: Family Medicine

## 2007-11-11 DIAGNOSIS — J189 Pneumonia, unspecified organism: Secondary | ICD-10-CM

## 2007-11-11 HISTORY — DX: Pneumonia, unspecified organism: J18.9

## 2007-12-10 ENCOUNTER — Ambulatory Visit: Payer: Self-pay

## 2008-01-25 DIAGNOSIS — G894 Chronic pain syndrome: Secondary | ICD-10-CM

## 2008-01-25 HISTORY — DX: Chronic pain syndrome: G89.4

## 2008-04-07 ENCOUNTER — Ambulatory Visit (HOSPITAL_COMMUNITY): Admission: RE | Admit: 2008-04-07 | Discharge: 2008-04-07 | Payer: Self-pay | Admitting: Neurology

## 2008-06-08 ENCOUNTER — Ambulatory Visit: Payer: Self-pay | Admitting: Neurology

## 2008-06-09 ENCOUNTER — Emergency Department (HOSPITAL_COMMUNITY): Admission: EM | Admit: 2008-06-09 | Discharge: 2008-06-09 | Payer: Self-pay | Admitting: Emergency Medicine

## 2008-07-12 IMAGING — XA IR FLUORO GUIDE CV LINE*R*
1 series · 2 of 2 positions shown · non-contrast
Comparison: none

CLINICAL DATA: PICC placement.
 UPPER EXTREMITY PICC PLACEMENT WITH ULTRASOUND AND FLUOROSCOPIC GUIDANCE ? 02/18/07: 
 Procedure:  The right arm was prepped with Betadine, draped in the usual sterile fashion, and infiltrated locally with 1% lidocaine.  Ultrasound demonstrated patency of the right basilic vein.  Under real-time ultrasound guidance, this vein was accessed with a 21 gauge micropuncture needle.  Ultrasound image documentation was performed.  The needle was exchanged over a guidewire for a Peel-Away sheath, through which a 5 French single-lumen PICC catheter trimmed to 44 cm was advanced, positioned with its tip at the distal SVC/right atrial junction.  Fluoroscopy during the procedure and fluoroscopic spot radiograph confirm appropriate catheter tip position.  The catheter was flushed, secured to the skin with Prolene sutures, and covered with a sterile dressing.  No immediate complications.

[Series 1: run · 2 of 2 slices shown]
[im 1/2]
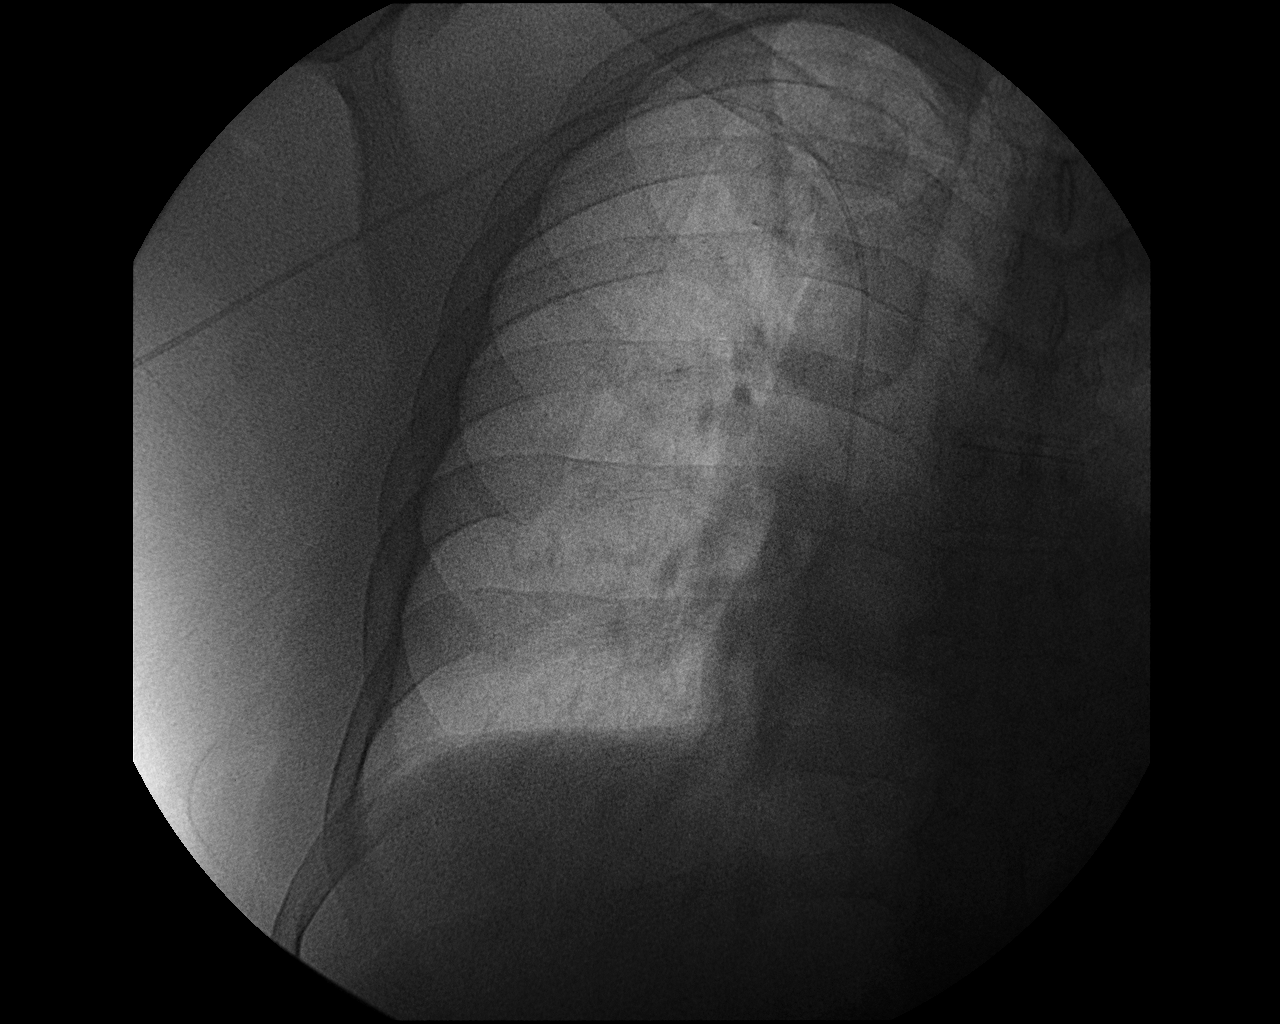
[im 2/2]
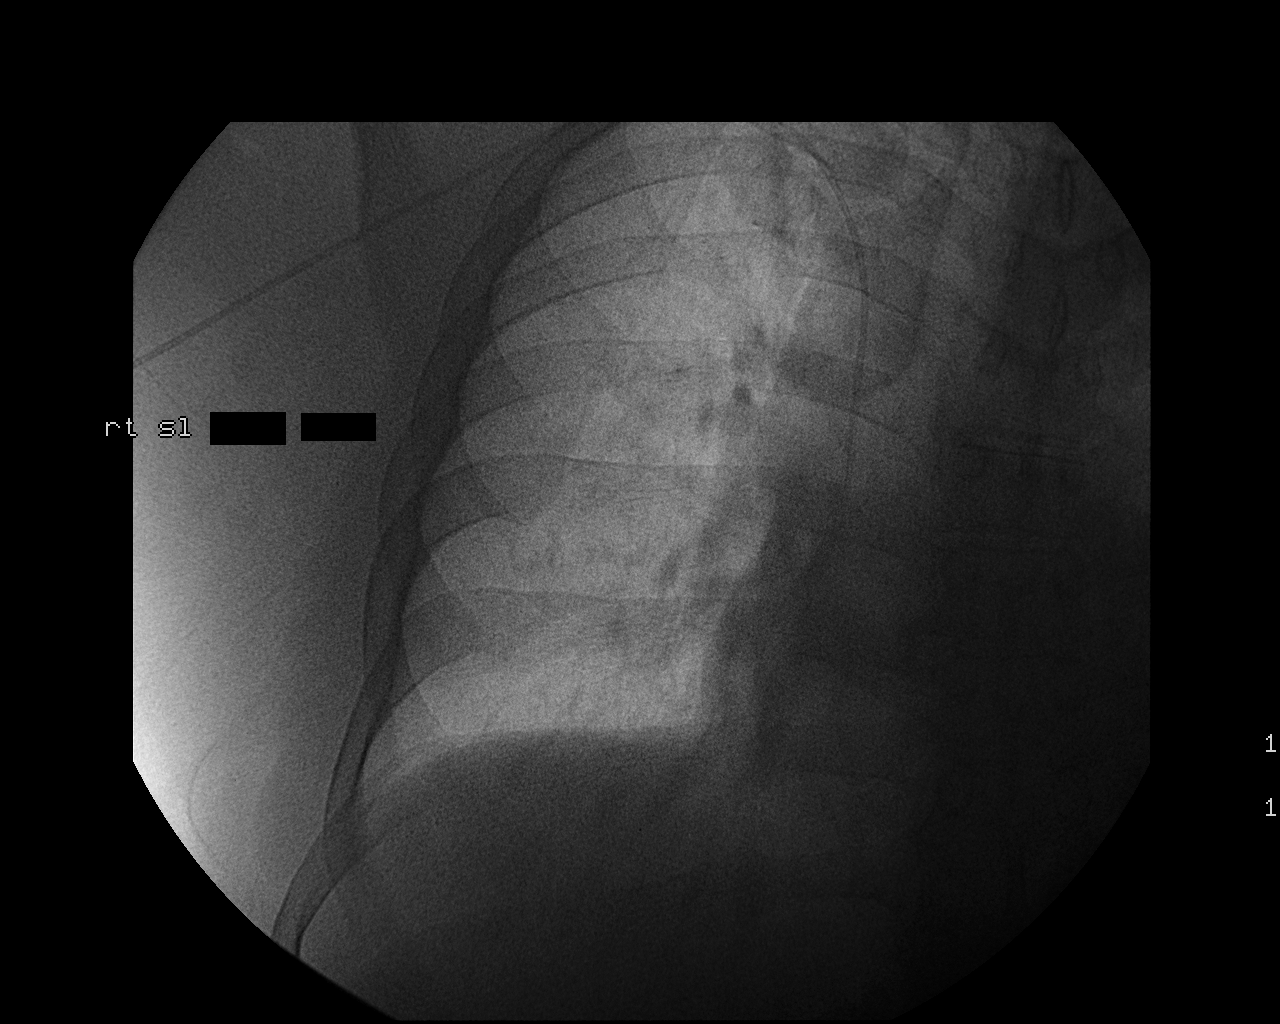

[2 of 2 positions shown; findings below may reference images not displayed]

IMPRESSION: Successful right arm PICC placement with ultrasound and fluoroscopic guidance.  Ready for routine use.

## 2008-09-12 ENCOUNTER — Encounter (HOSPITAL_COMMUNITY): Admission: RE | Admit: 2008-09-12 | Discharge: 2008-12-11 | Payer: Self-pay | Admitting: Neurology

## 2008-09-21 ENCOUNTER — Inpatient Hospital Stay (HOSPITAL_COMMUNITY): Admission: EM | Admit: 2008-09-21 | Discharge: 2008-10-02 | Payer: Self-pay | Admitting: Emergency Medicine

## 2008-09-21 ENCOUNTER — Ambulatory Visit: Payer: Self-pay | Admitting: Internal Medicine

## 2008-10-23 IMAGING — CR DG LUMBAR SPINE COMPLETE 4+V
2 series · 2 of 2 positions shown · non-contrast
Comparison: MRI dated 12/13/05.

CLINICAL DATA: 46-year-old unable to move lower extremities.  Patient has MS. 
LUMBAR SPINE ? 3 VIEW:

[w l-spine lat]
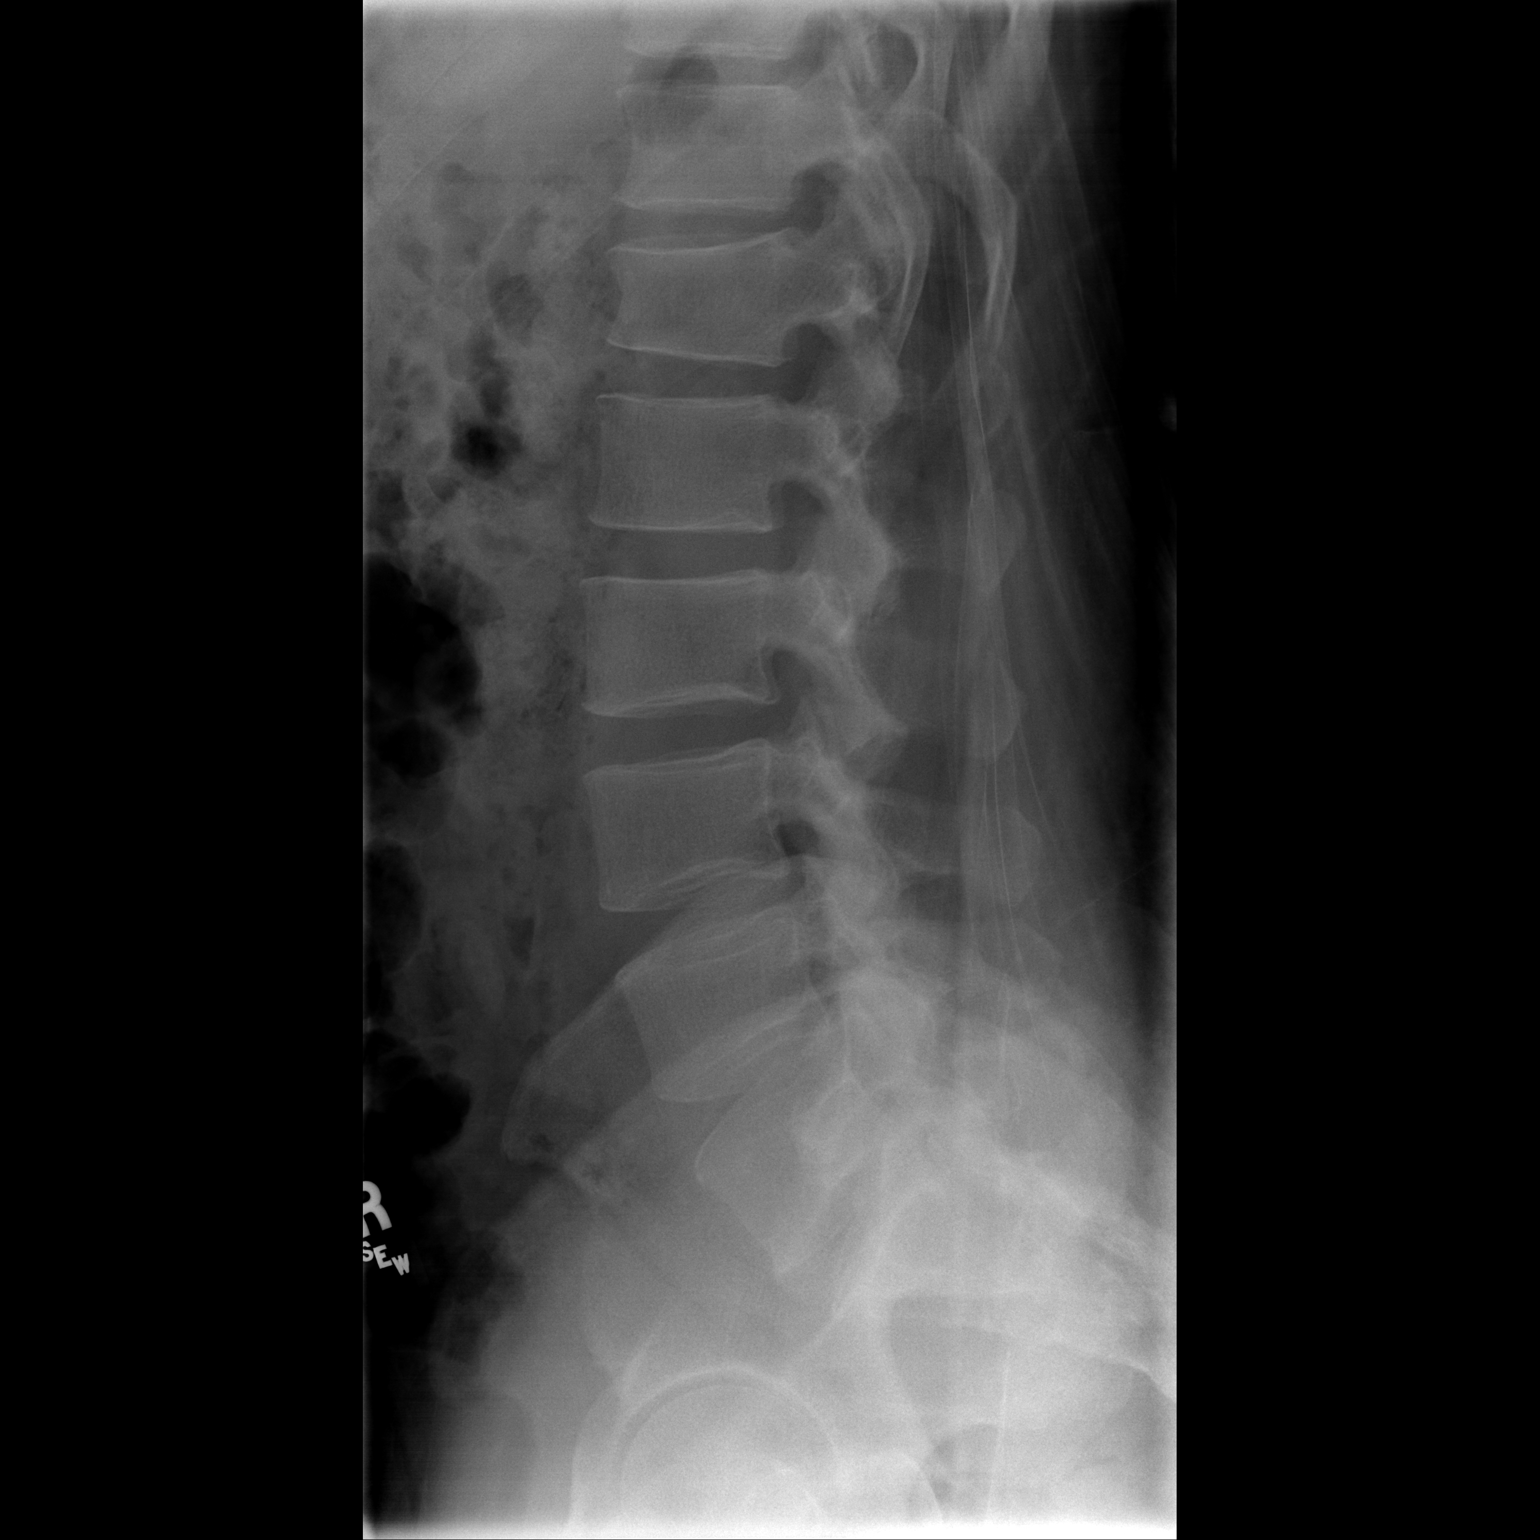

[w l-spine l5-s1 spot]
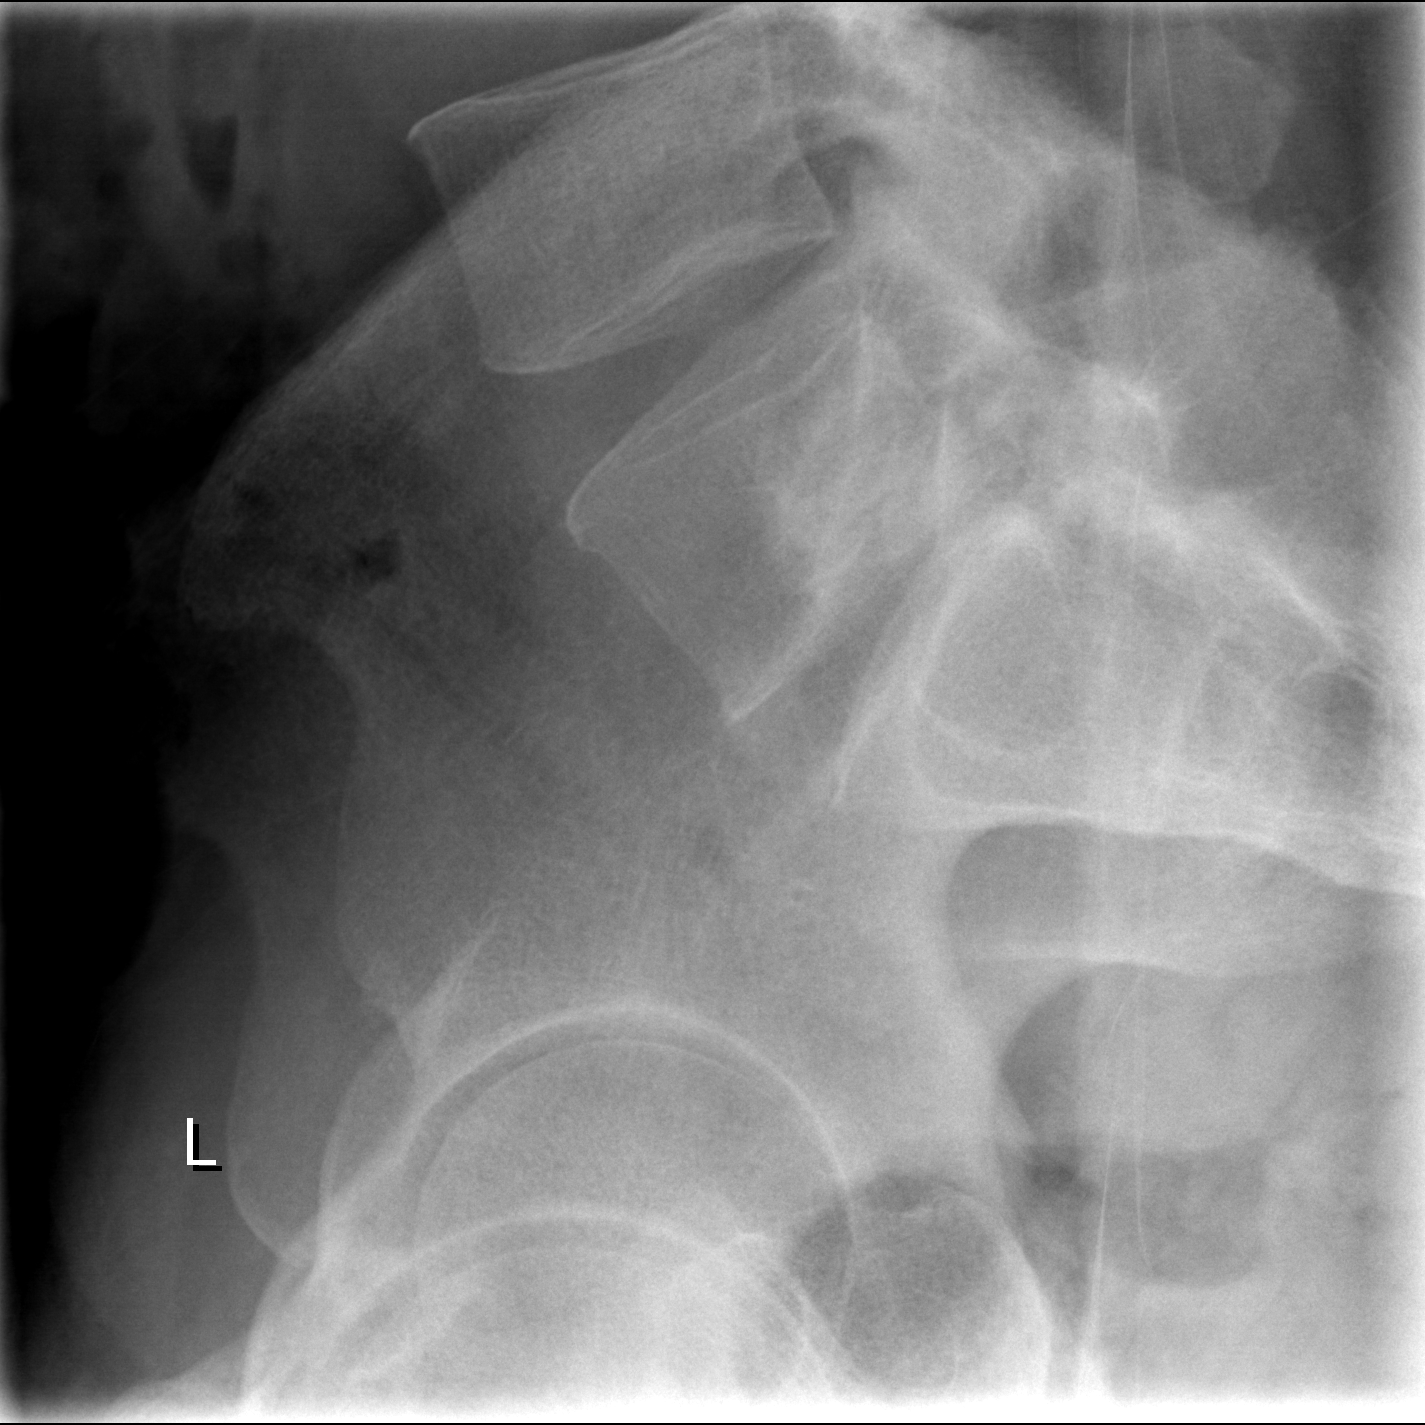

[2 of 2 positions shown; findings below may reference images not displayed]

FINDINGS: AP, lateral, and oblique images of the lumbar spine were obtained.  Normal alignment of the lumbar spine.  Again noted is a compression deformity involving the L1 vertebral body.  S-1 vertebral body appears to be transitional.
IMPRESSION: 1.  No acute bony abnormalities of the lumbar spine. 
2.  Re-demonstration of a compression deformity involving L1.

## 2008-10-23 IMAGING — CR DG THORACIC SPINE 2V
3 series · 3 of 3 positions shown · non-contrast
Comparison: MR from 12/13/05.

CLINICAL DATA: Unable to move lower extremity.  History of multiple sclerosis.  
 THORACIC SPINE ? 2 VIEW:

[t t-spine a.p.]
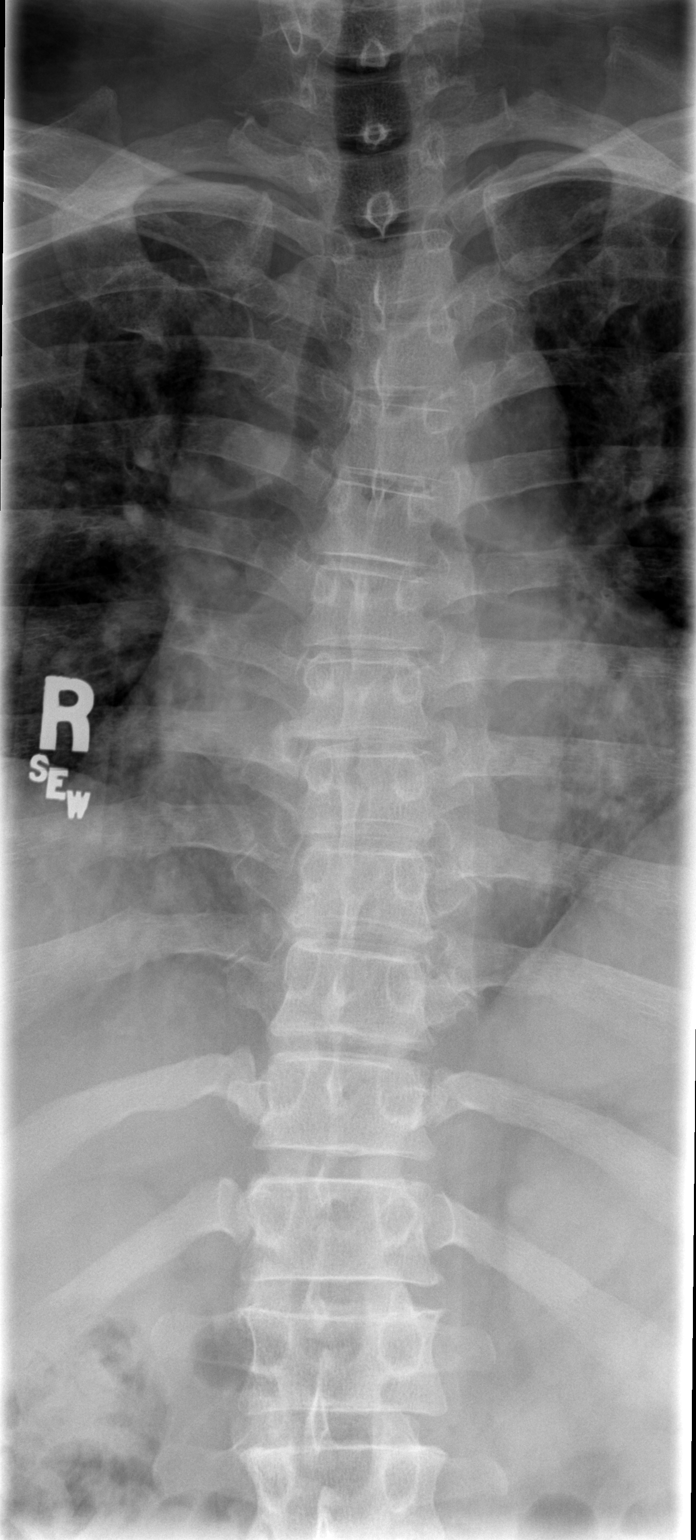

[w t-spine lat *]
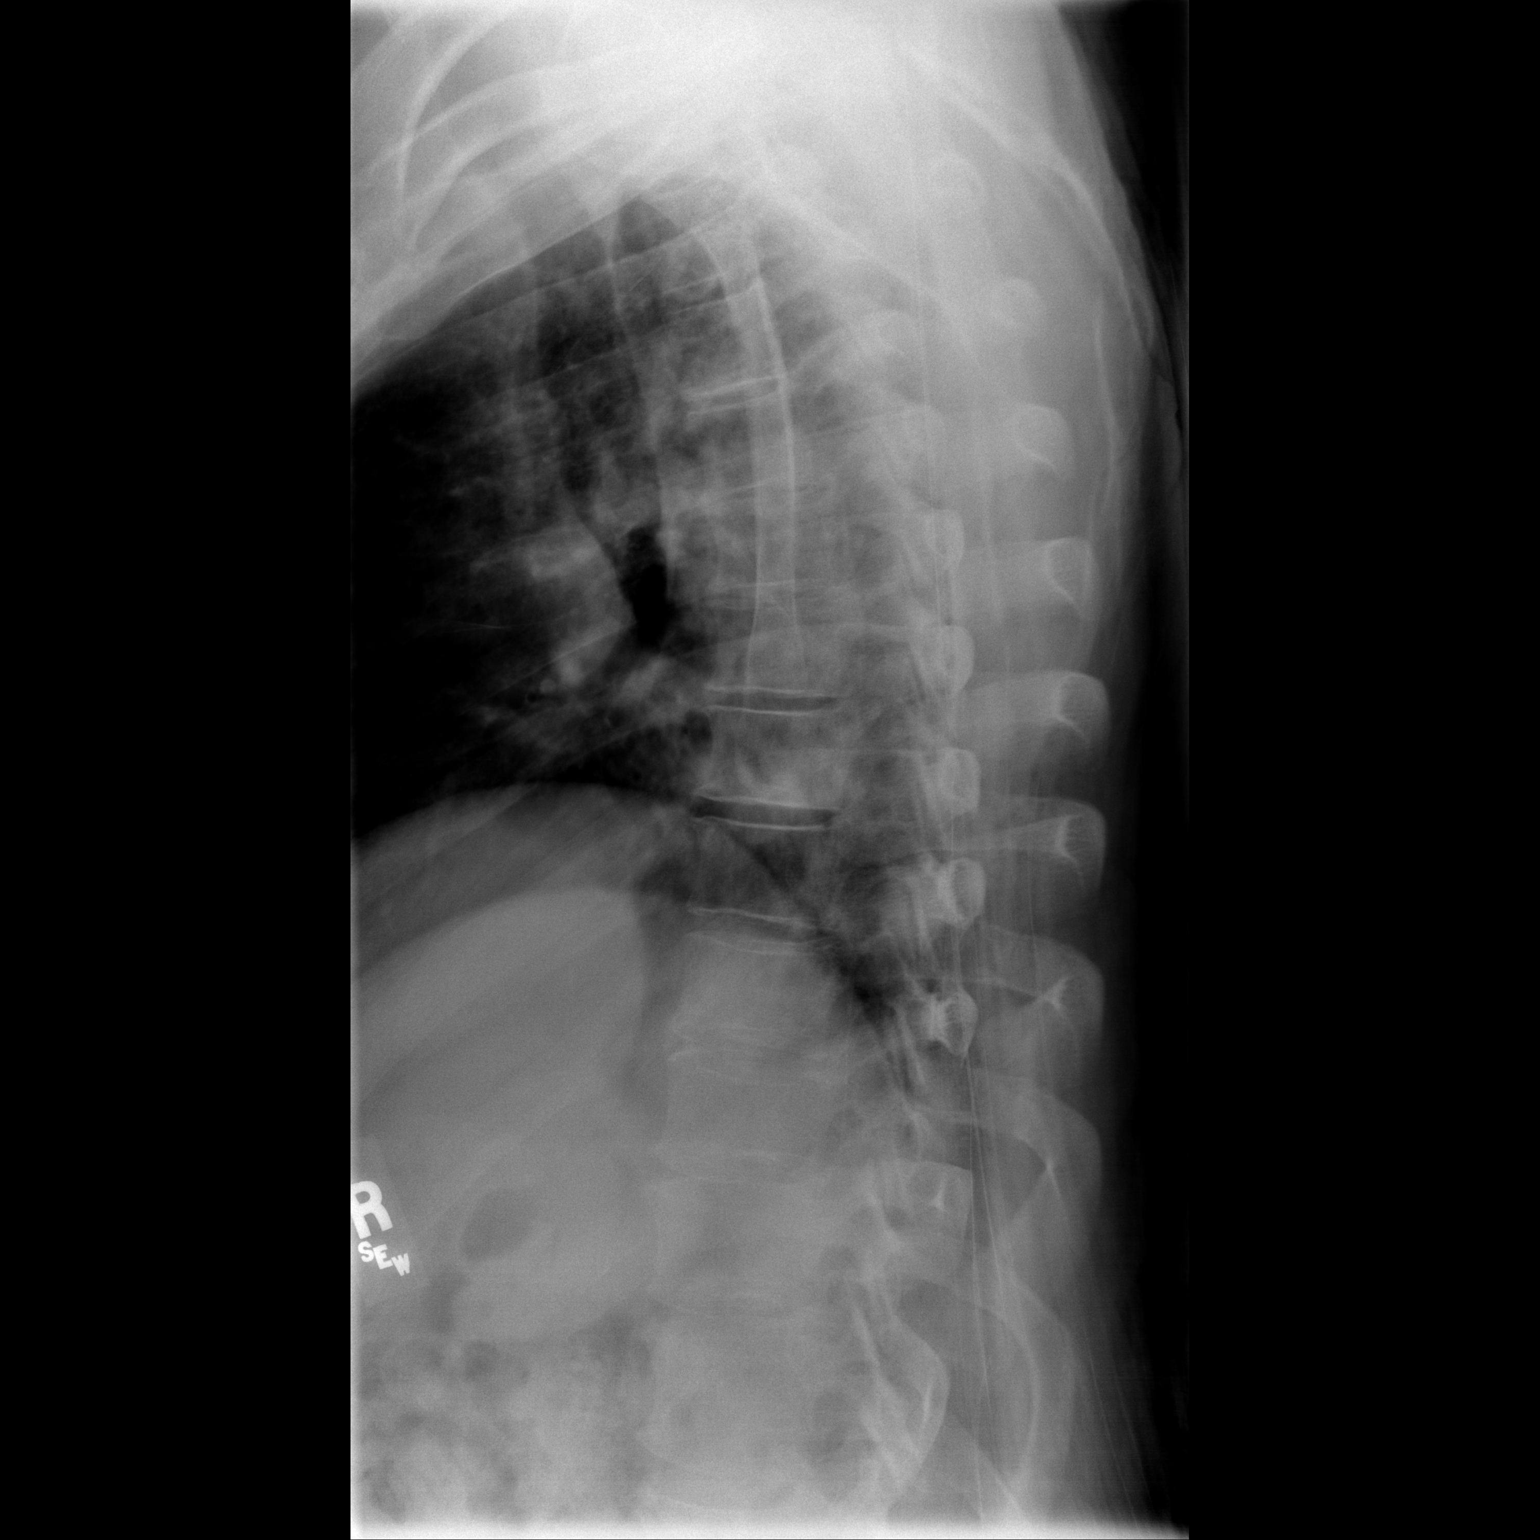

[w swimmers view *]
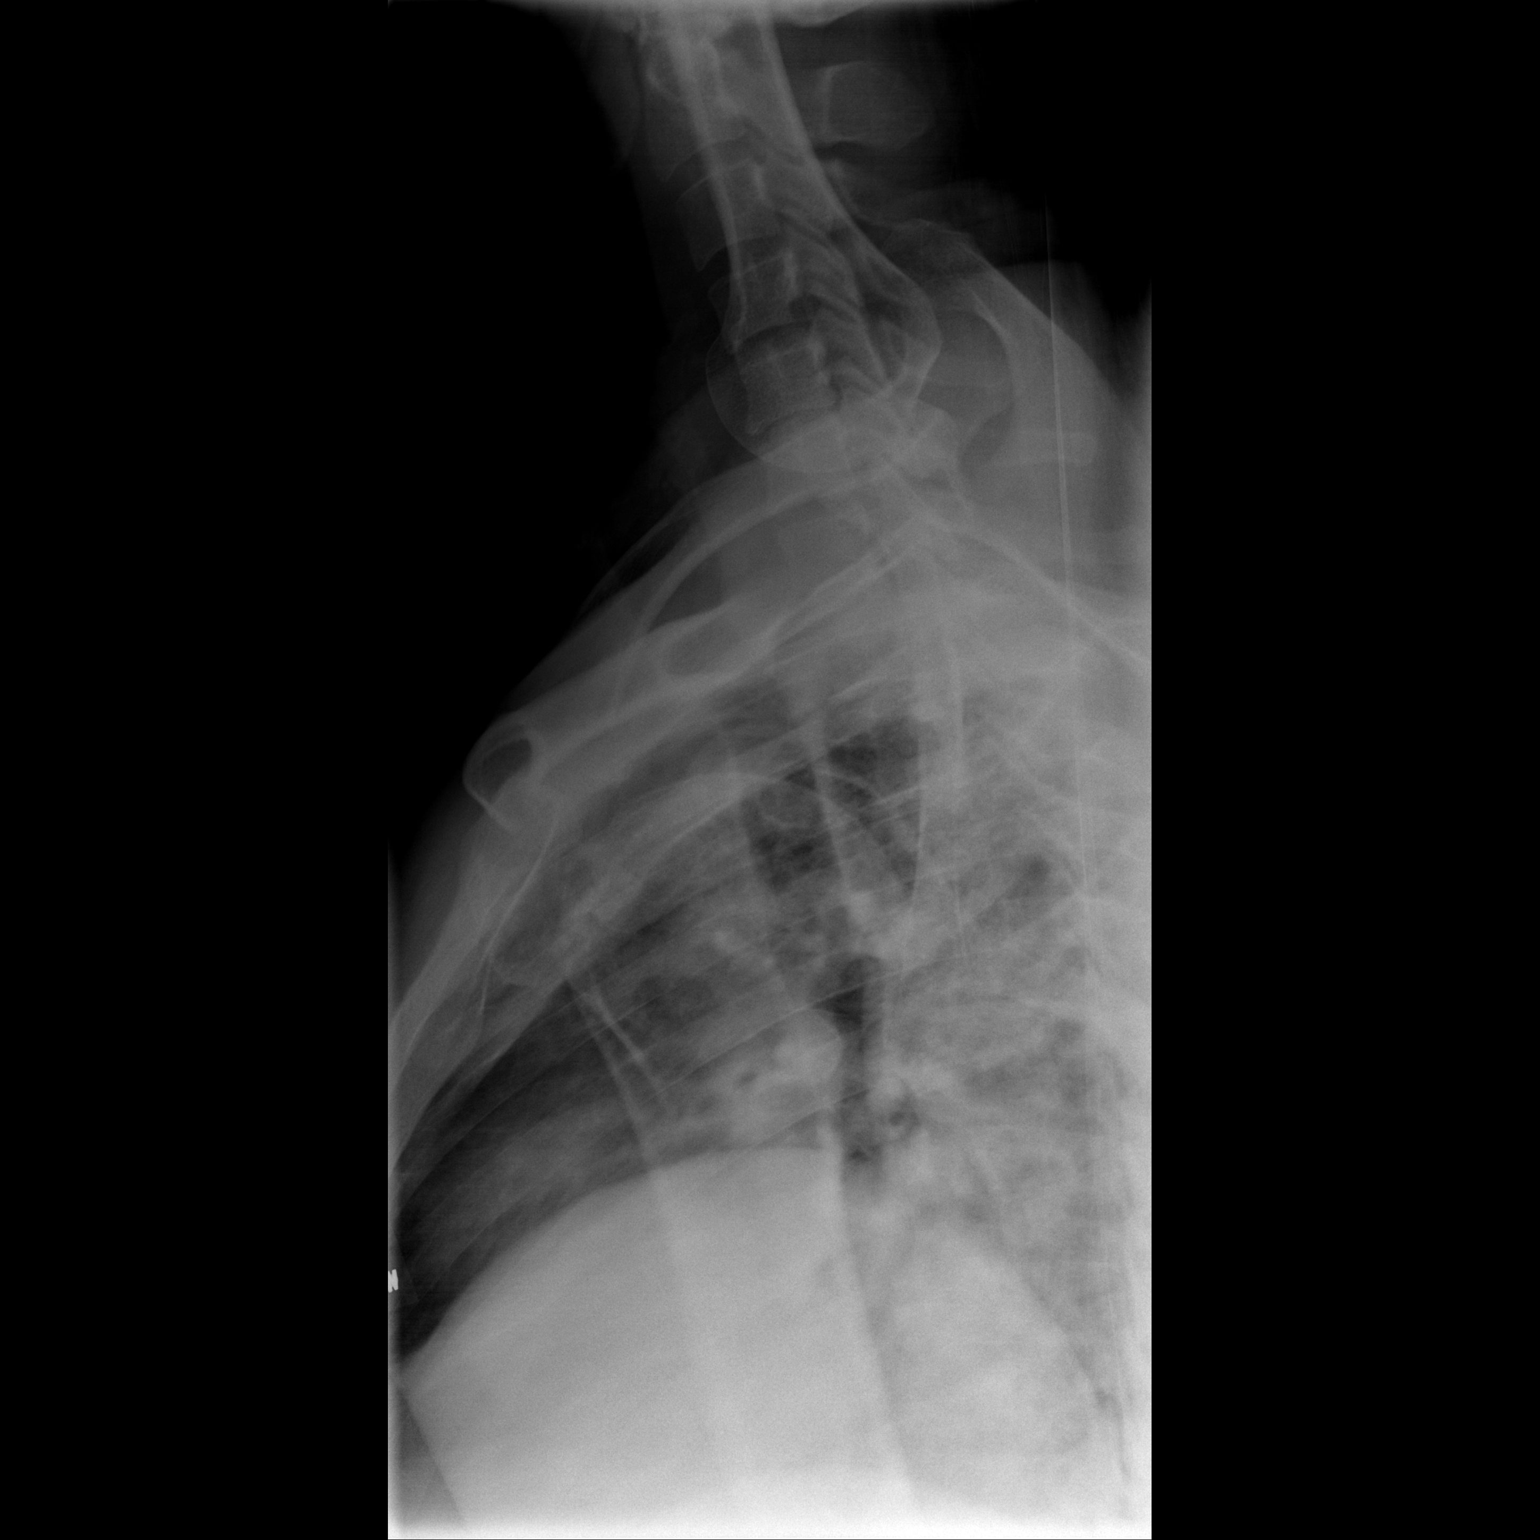

[3 of 3 positions shown; findings below may reference images not displayed]

FINDINGS: AP and lateral views of the thoracic spine were obtained.  Comparison is also made from a chest radiograph on 12/13/05 which again demonstrates some scoliosis of the upper thoracic spine.  There is a mild rotatory component to the scoliosis.  No acute bone abnormalities are appreciated.  Poor visualization of the cervicothoracic junction.
IMPRESSION: Mild scoliosis of the thoracic spine.  No acute bone abnormalities.

## 2008-10-23 IMAGING — CR DG CERVICAL SPINE COMPLETE 4+V
6 series · 6 of 6 positions shown · non-contrast
Comparison: none

CLINICAL DATA: Neck pain.  Multiple sclerosis. 
 CERVICAL SPINE ? 6 VIEW:

[w c-spine lat]
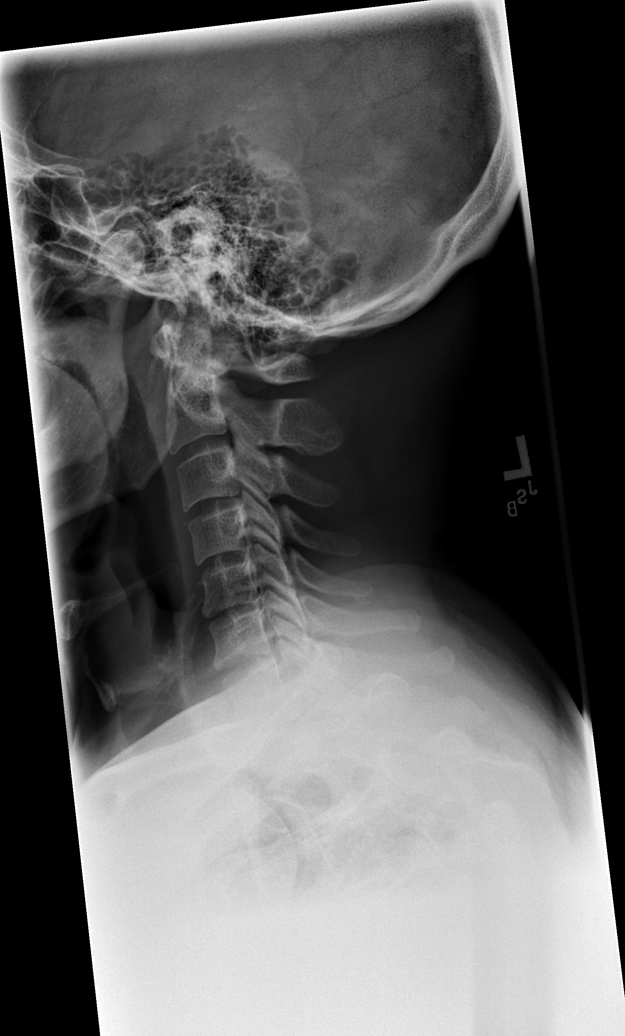

[w c-spine oblique (1 of 2)]
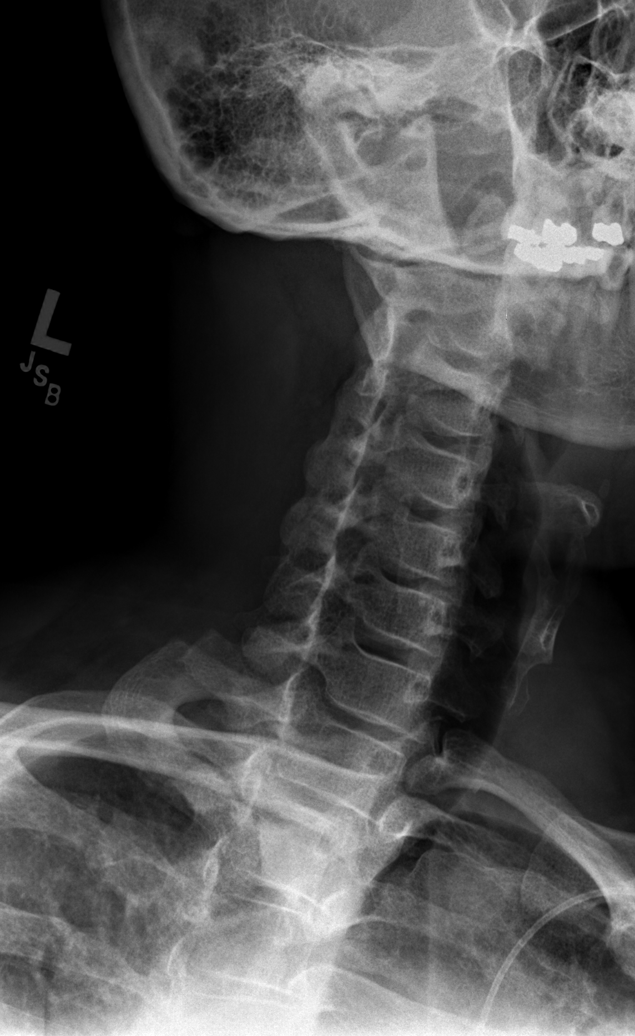

[w c-spine oblique (2 of 2)]
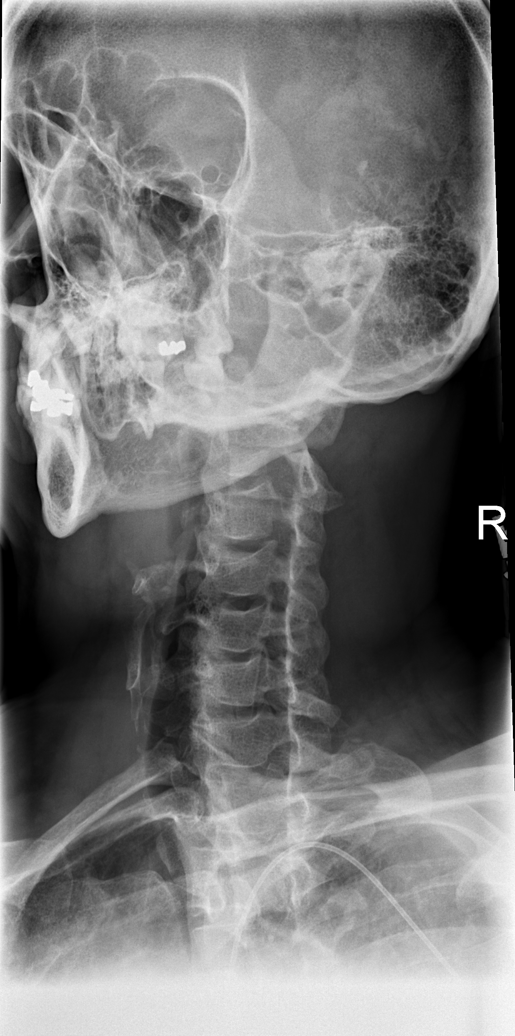

[w c-spine a.p.]
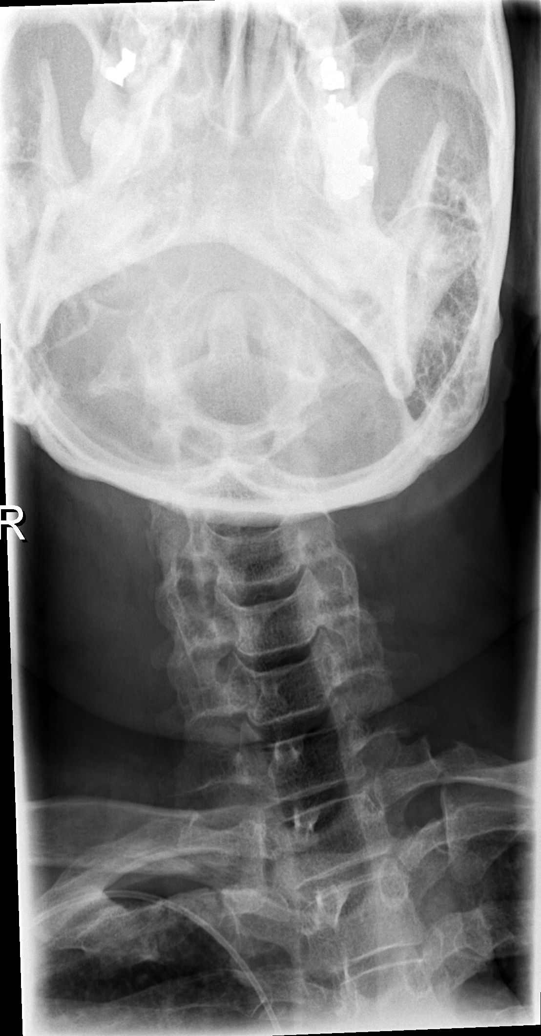

[w c-spine odontoid]
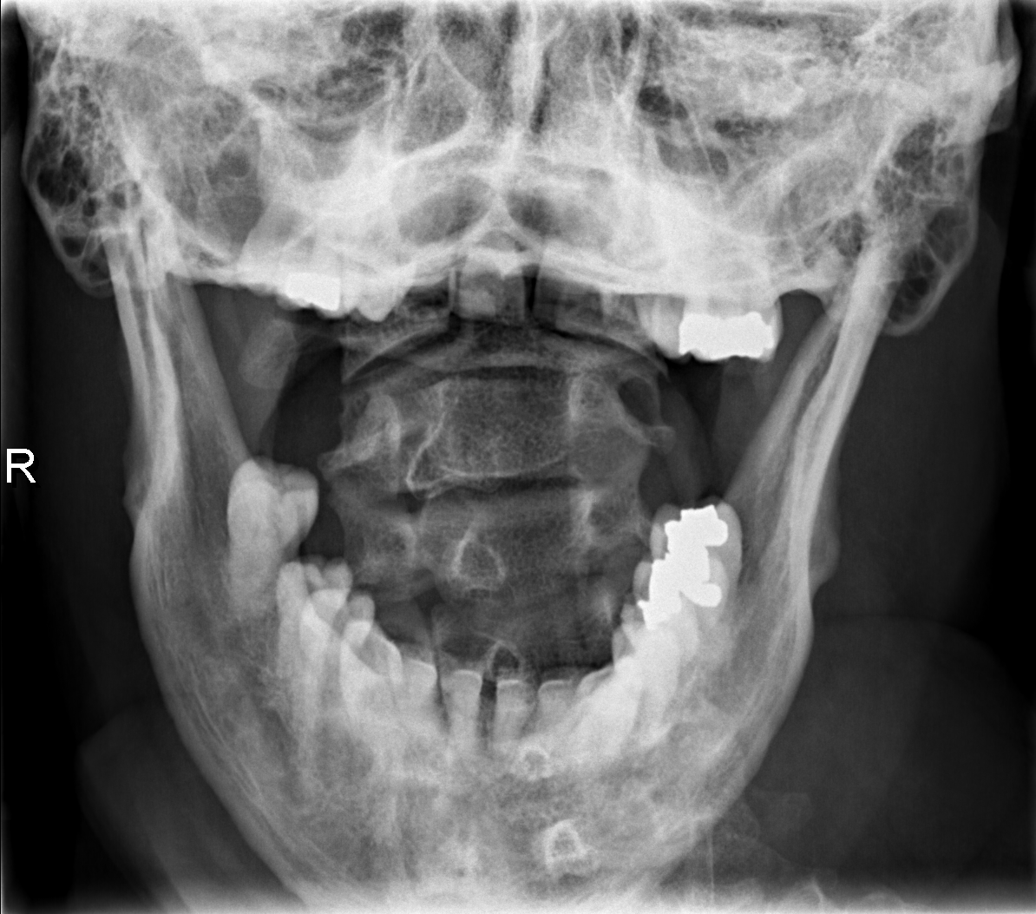

[w swimmers view]
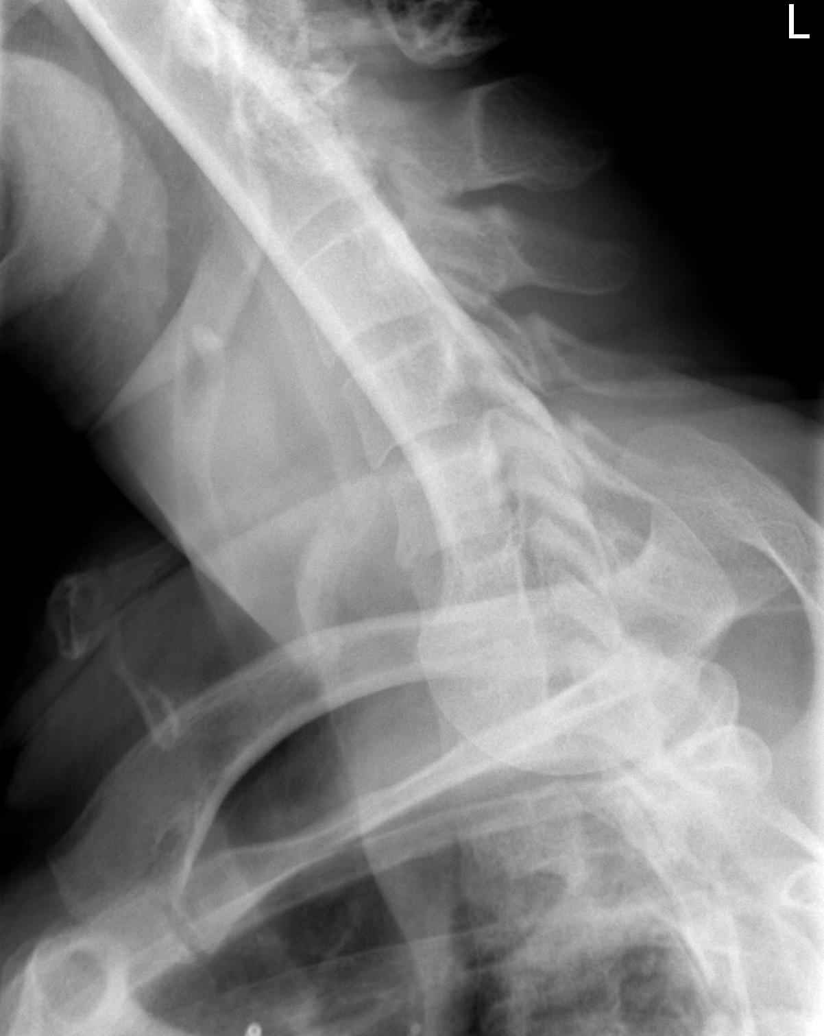

[6 of 6 positions shown; findings below may reference images not displayed]

FINDINGS: Normal alignment is noted without evidence of fracture, subluxation or dislocation.  Mild degenerative disc disease and spondylosis at C5-6 is noted. 
 No prevertebral soft tissue swelling is identified.
IMPRESSION: 1.  No acute abnormality.  
 2.  Mild degenerative disc disease at C5-6.

## 2008-10-23 IMAGING — CR DG FEMUR 2V*L*
3 series · 3 of 3 positions shown · non-contrast
Comparison: none

CLINICAL DATA: Left hip and lower leg pain.  Unable to move.
 LEFT HIP ? 3 VIEW:

[t femur with hip  ap left]
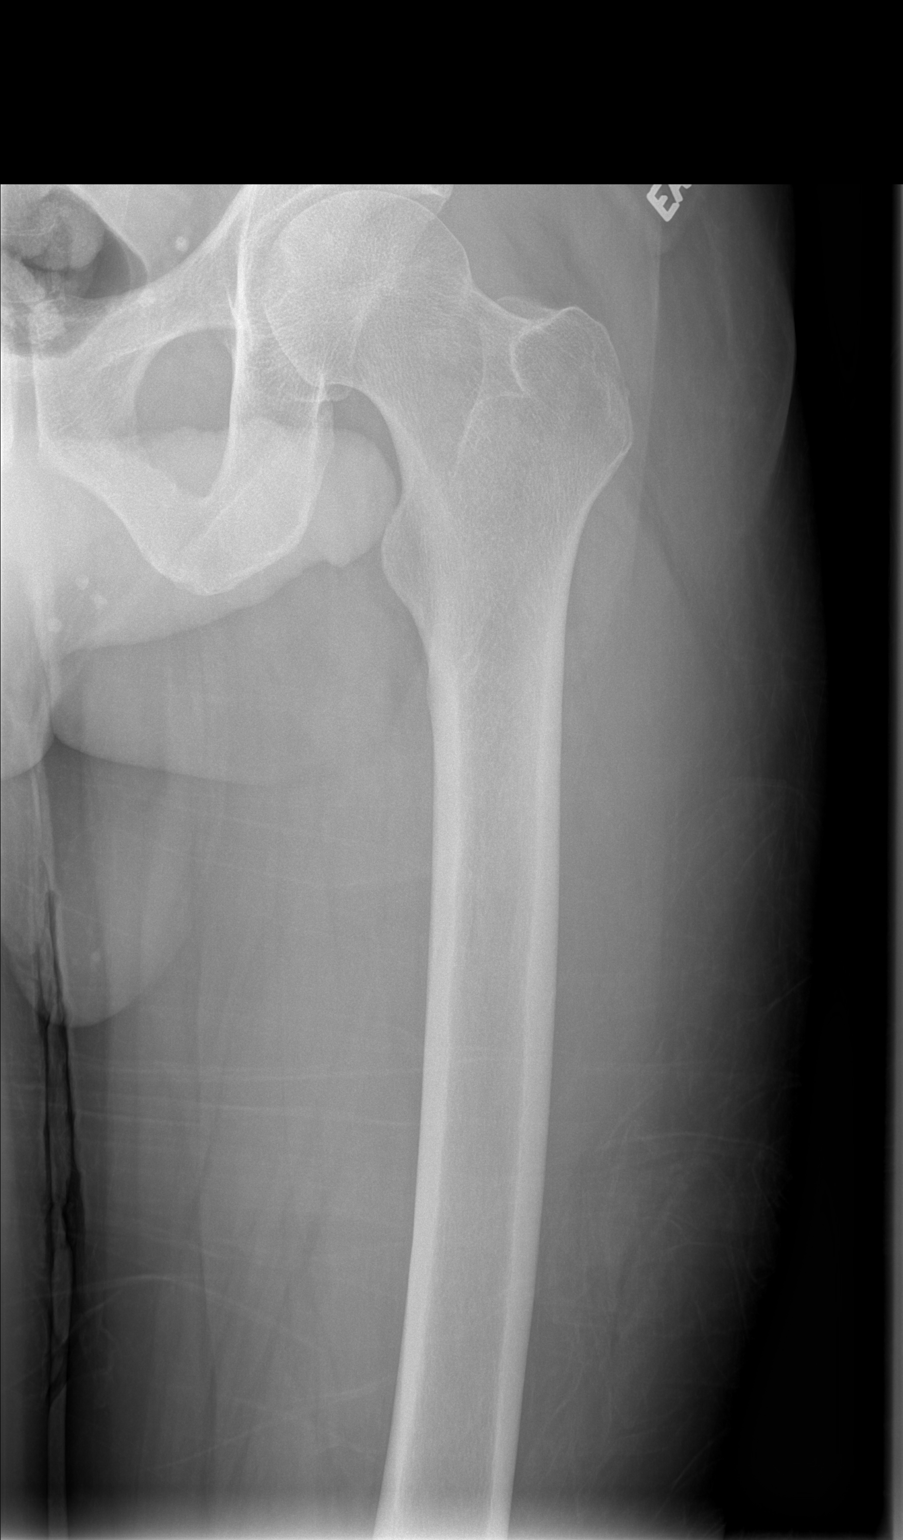

[t femur with knee ap left]
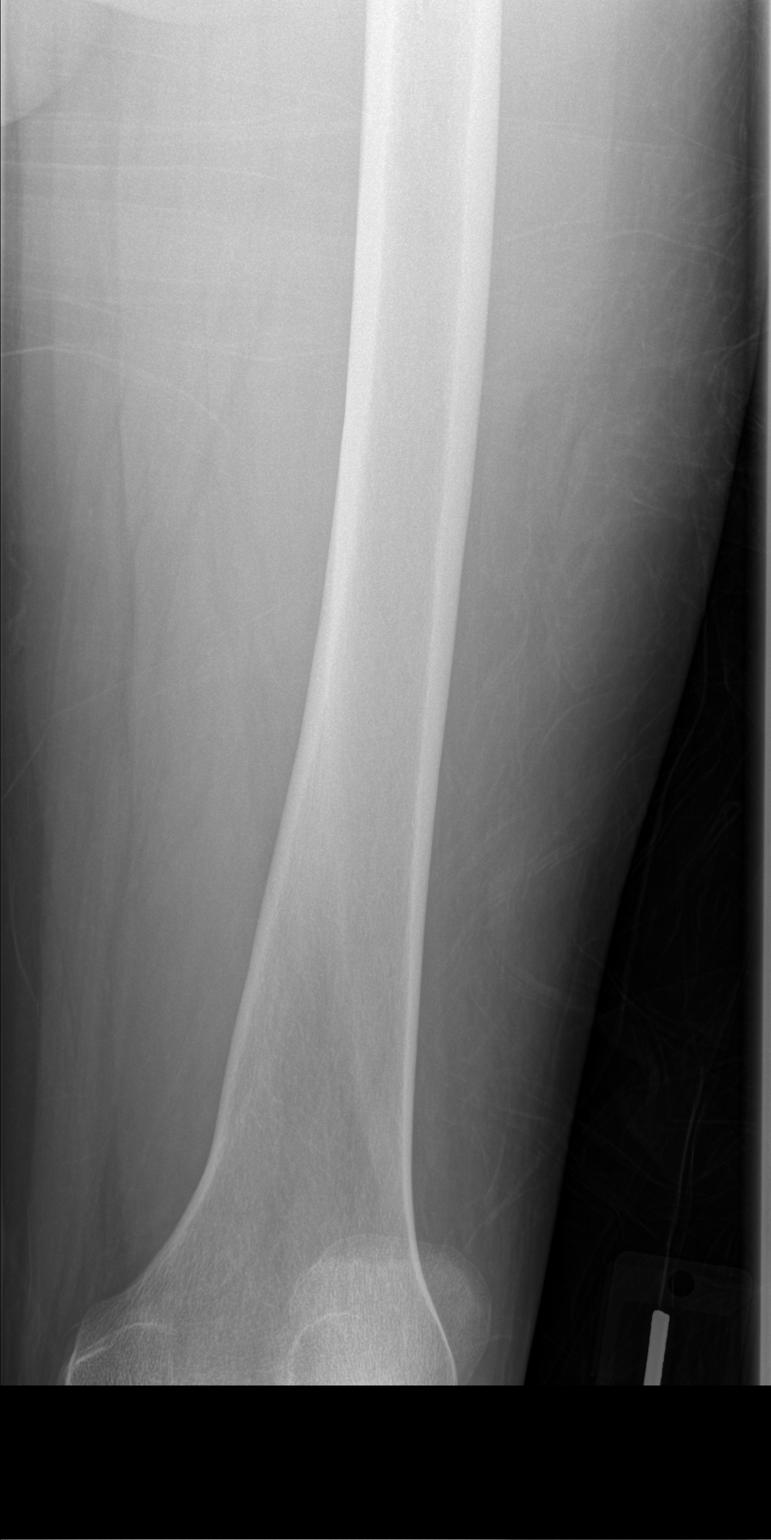

[t femur with knee lat left]
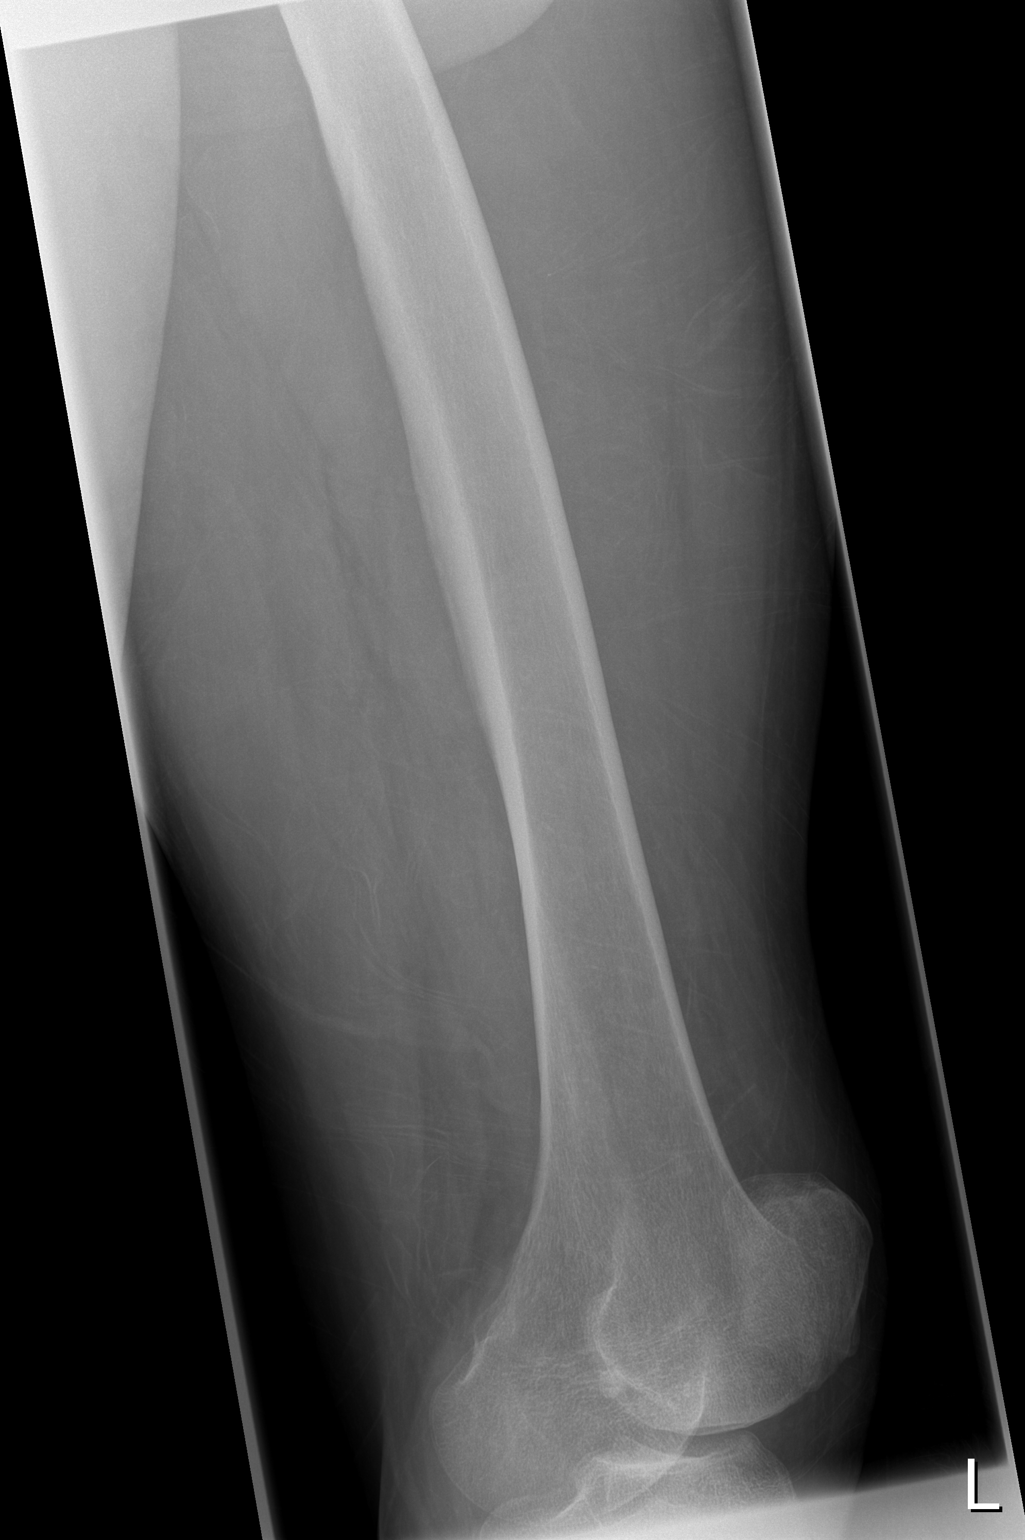

[3 of 3 positions shown; findings below may reference images not displayed]

FINDINGS: There is no evidence of hip fracture or dislocation.  There is no evidence of arthropathy or other focal bone abnormality.
IMPRESSION: Negative.
 LEFT FEMUR ? 2 VIEW:
FINDINGS: There is no evidence of fracture or other focal bone lesions.  Soft tissues are unremarkable.
IMPRESSION: Negative.

## 2008-10-23 IMAGING — CR DG HIP (WITH OR WITHOUT PELVIS) 2-3V*L*
3 series · 3 of 3 positions shown · non-contrast
Comparison: none

CLINICAL DATA: Left hip and lower leg pain.  Unable to move.
 LEFT HIP ? 3 VIEW:

[t pelvis a.p.]
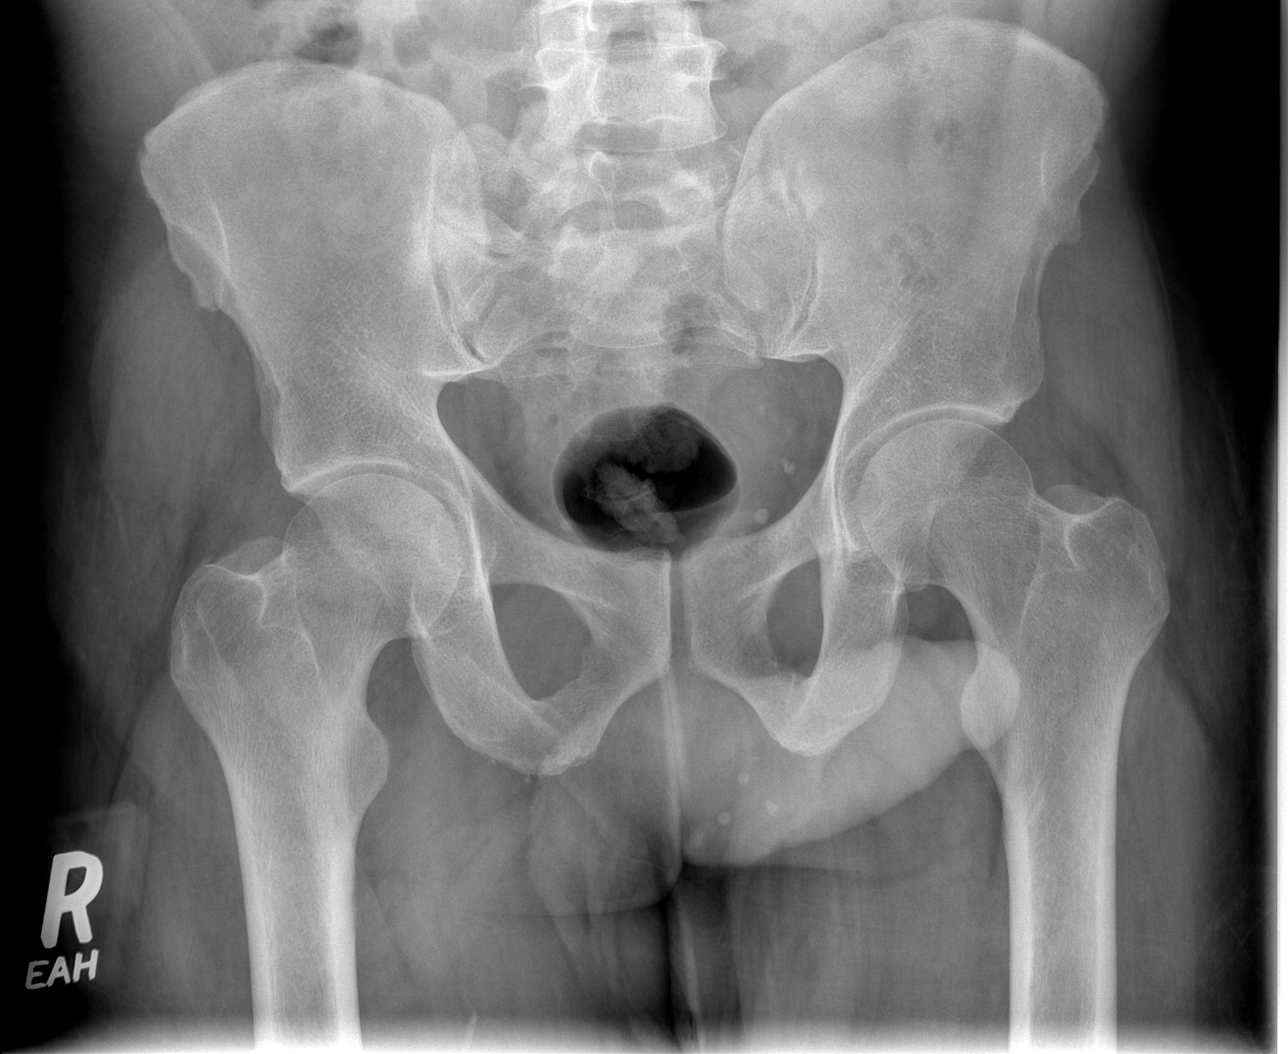

[t hip ap left]
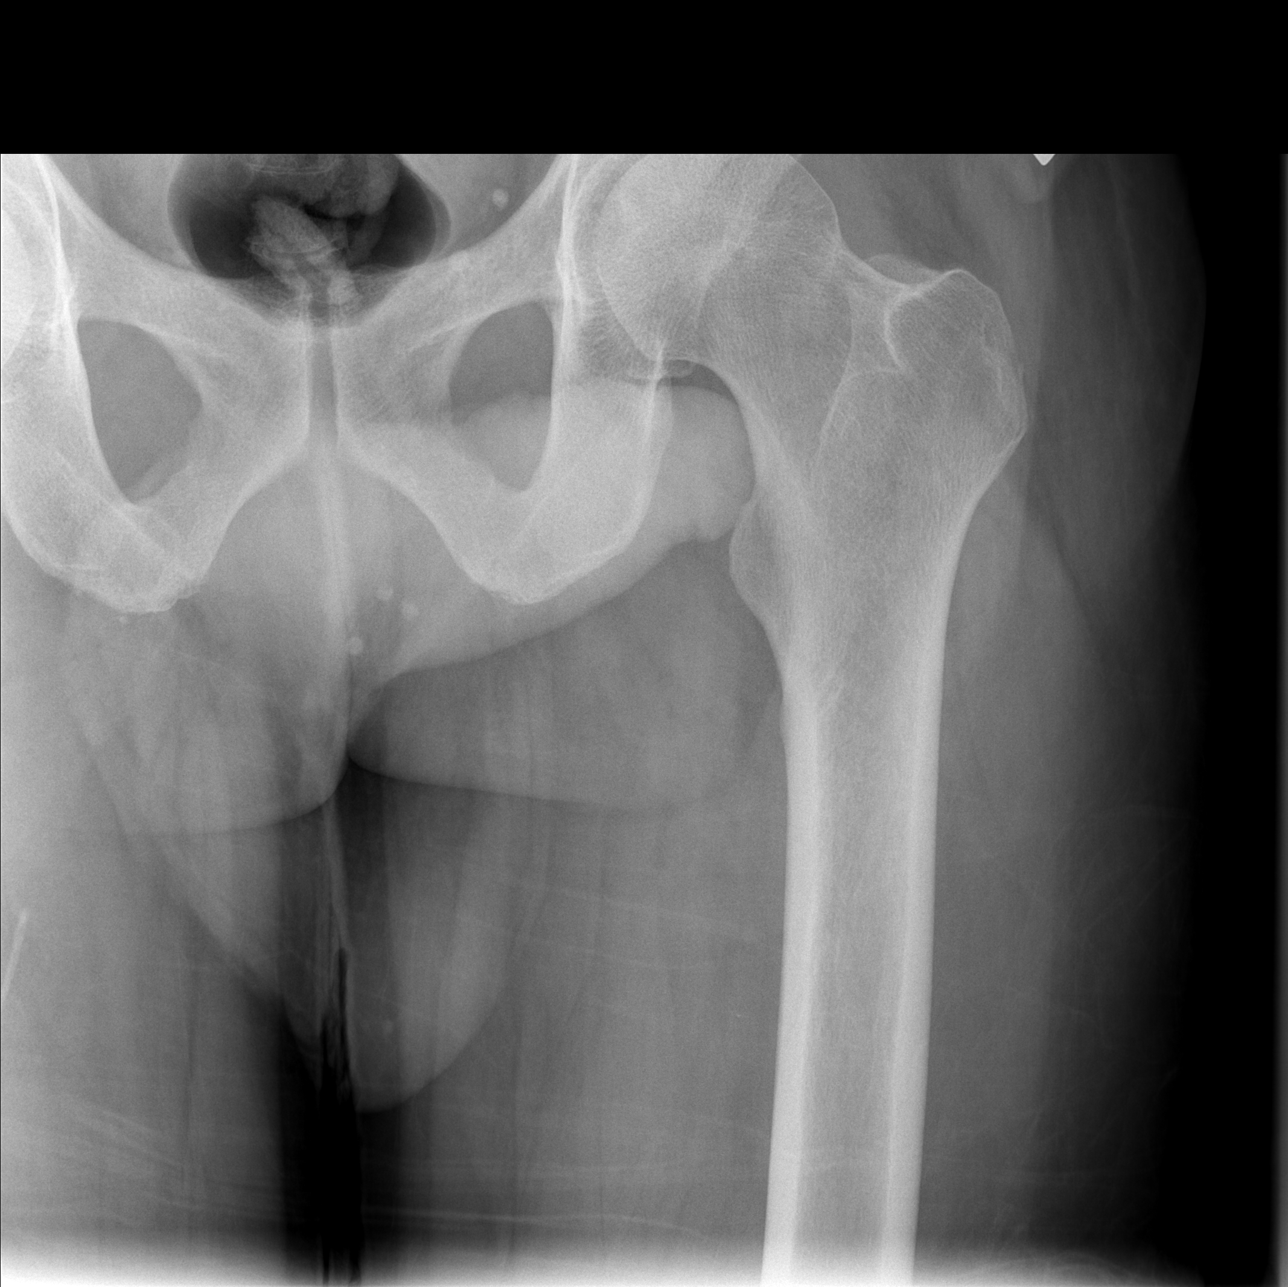

[t hip frog leg left]
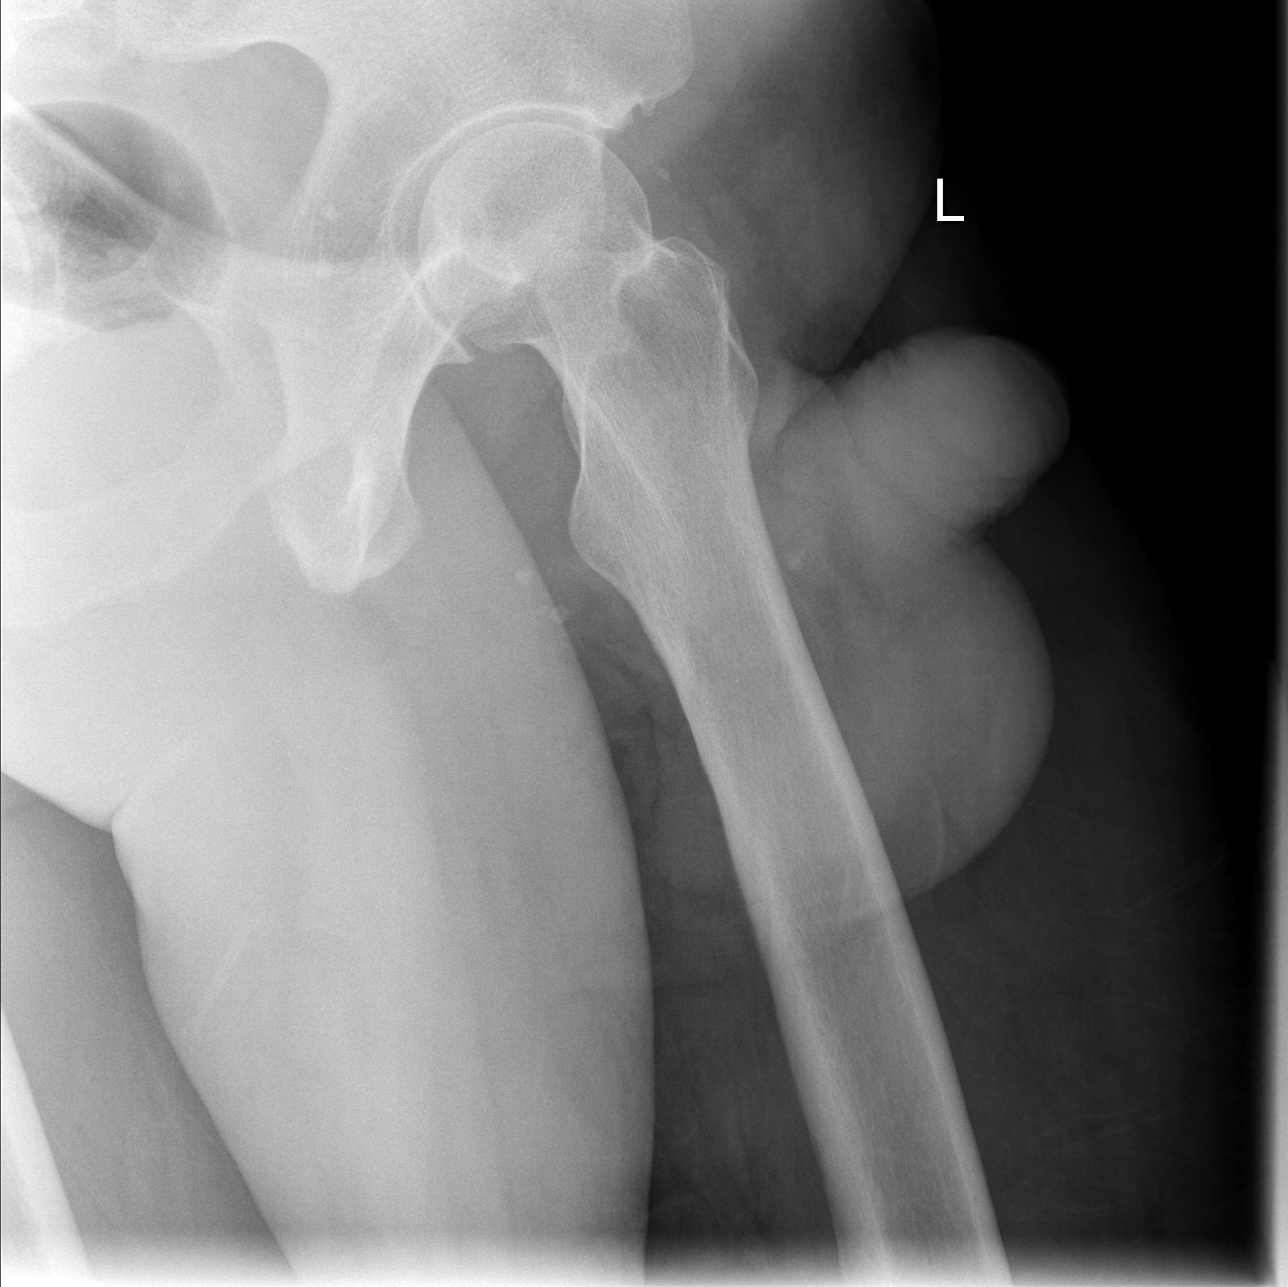

[3 of 3 positions shown; findings below may reference images not displayed]

FINDINGS: There is no evidence of hip fracture or dislocation.  There is no evidence of arthropathy or other focal bone abnormality.
IMPRESSION: Negative.
 LEFT FEMUR ? 2 VIEW:
FINDINGS: There is no evidence of fracture or other focal bone lesions.  Soft tissues are unremarkable.
IMPRESSION: Negative.

## 2008-10-23 IMAGING — CR DG LUMBAR SPINE COMPLETE 4+V
3 series · 3 of 3 positions shown · non-contrast
Comparison: MRI dated 12/13/05.

CLINICAL DATA: 46-year-old unable to move lower extremities.  Patient has MS. 
LUMBAR SPINE ? 3 VIEW:

[t l-spine a.p.]
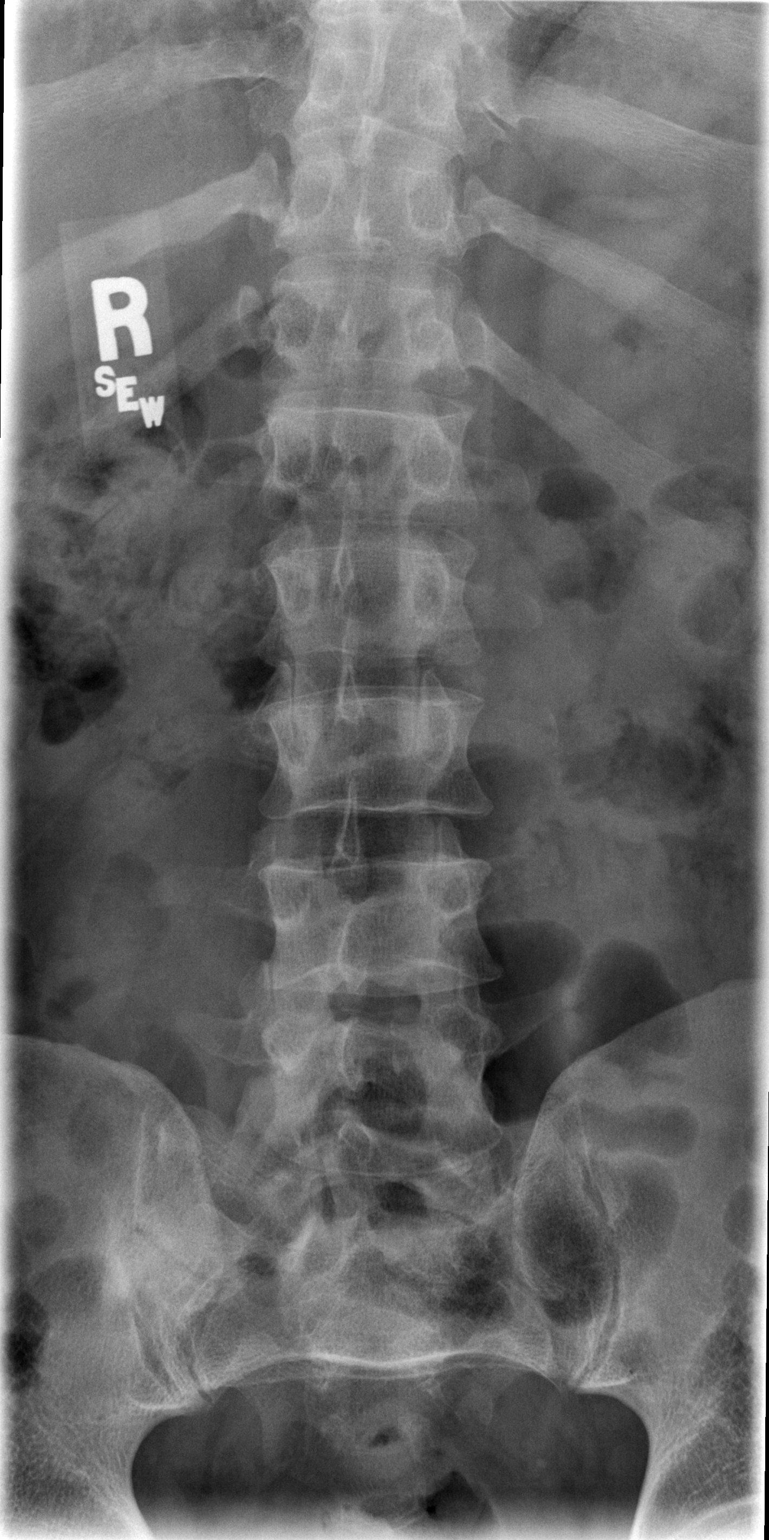

[t l-spine oblique exposure (1 of 2)]
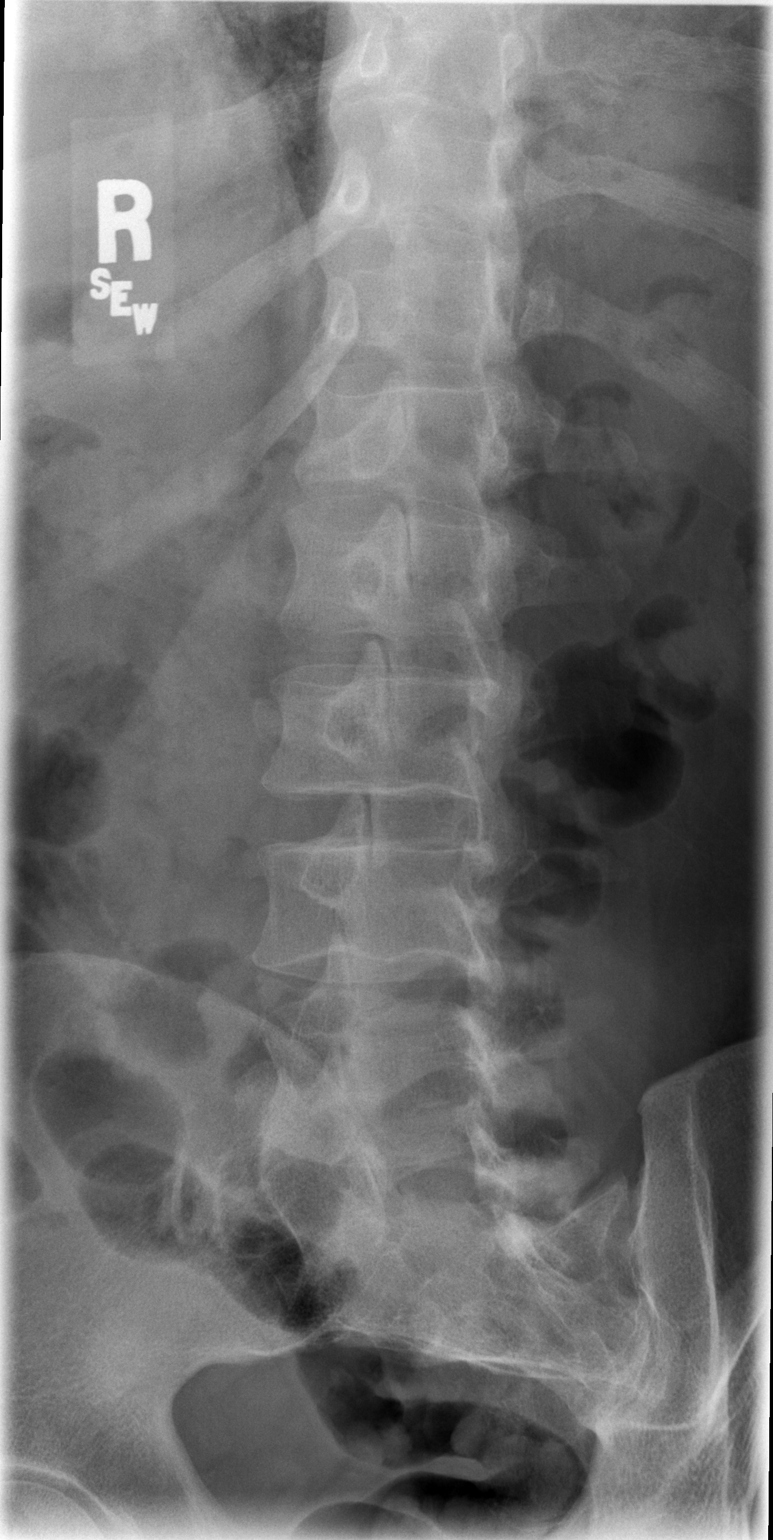

[t l-spine oblique exposure (2 of 2)]
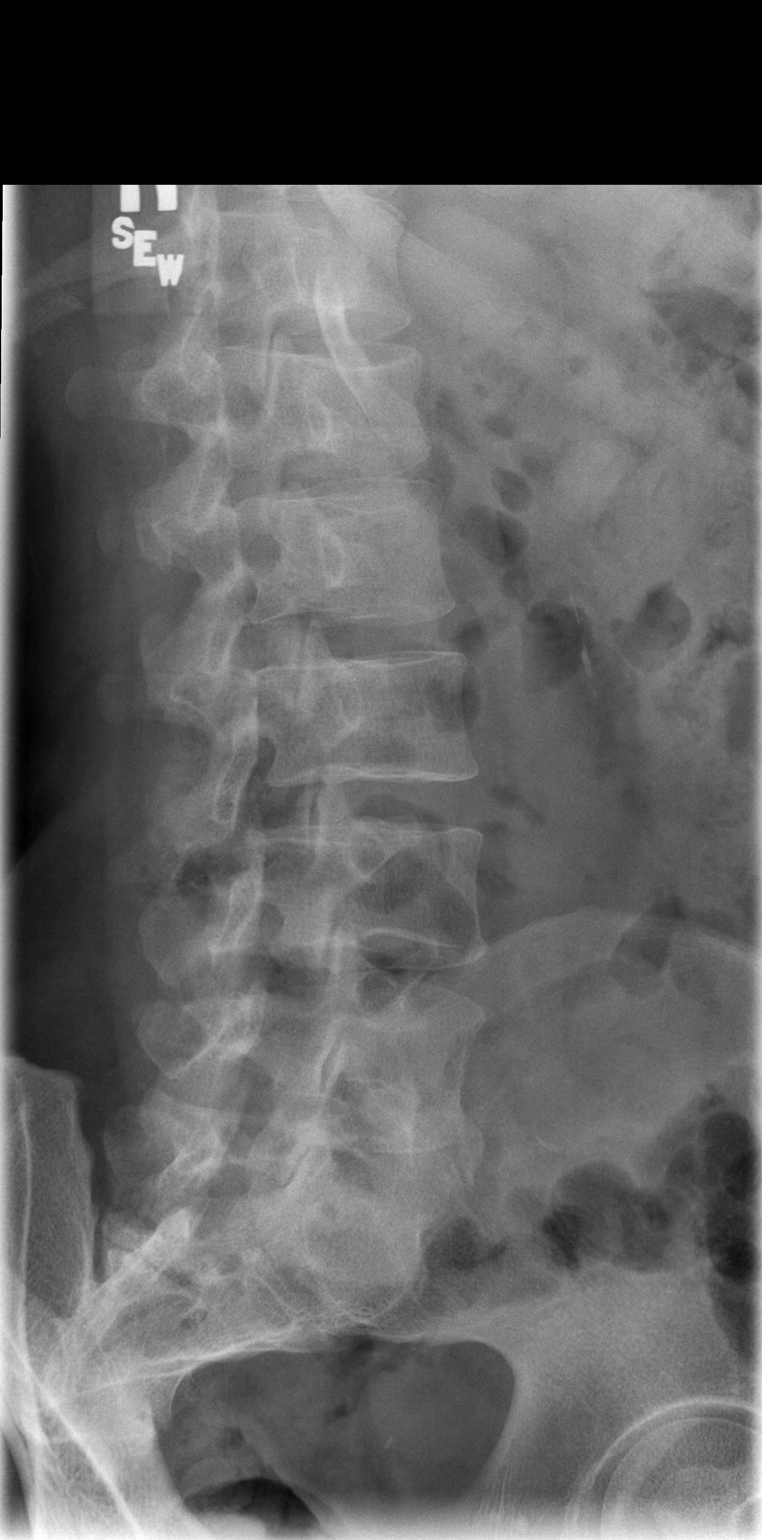

[3 of 3 positions shown; findings below may reference images not displayed]

FINDINGS: AP, lateral, and oblique images of the lumbar spine were obtained.  Normal alignment of the lumbar spine.  Again noted is a compression deformity involving the L1 vertebral body.  S-1 vertebral body appears to be transitional.
IMPRESSION: 1.  No acute bony abnormalities of the lumbar spine. 
2.  Re-demonstration of a compression deformity involving L1.

## 2008-10-23 IMAGING — CT CT ANGIO CHEST
2 of 5 series · 19 of 36 positions shown · IV contrast (APPLIED)
Comparison: Prior CT angio chest of 08/12/04.

CLINICAL DATA: Left lower extremity weakness.   History of pulmonary emboli.  Multiple sclerosis as well as hypertension.
 CT ANGIOGRAPHY OF CHEST:
TECHNIQUE: Multidetector CT imaging of the chest was performed during bolus injection of intravenous contrast.  Multiplanar CT angiographic image reconstructions were generated to evaluate the vascular anatomy.
 Contrast:  80 cc Omnipaque 300.

[Series 4: pulm embolism 2.0 b31f st · axial · 0.76mm/px · z∈[+1196,+1426]mm · 16 of 127 slices shown]
[im 6/127  lung]
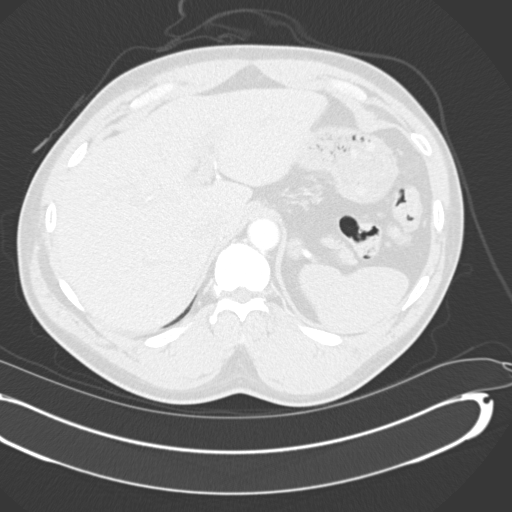
[im 12/127  mediastinal]
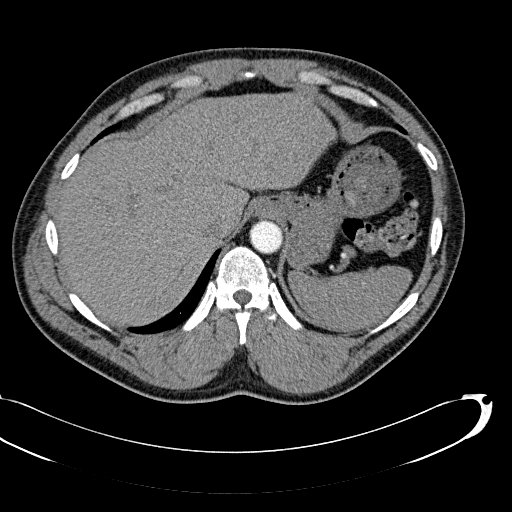
[im 23/127  lung]
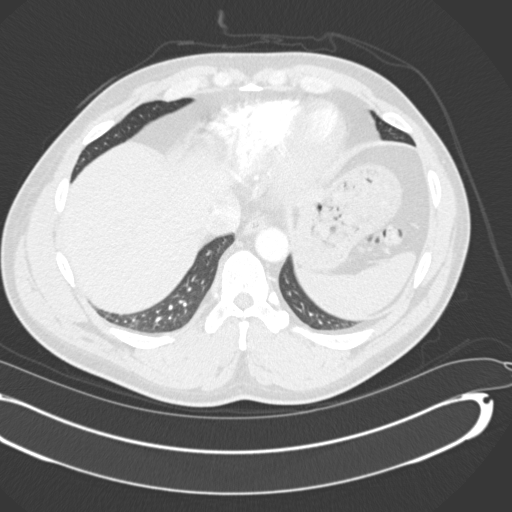
[im 29/127  mediastinal]
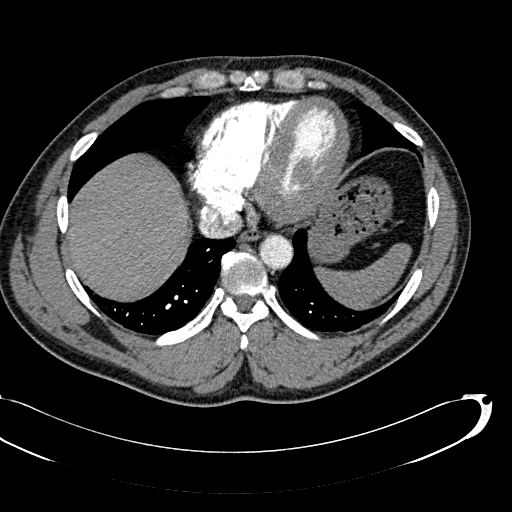
[im 35/127  lung]
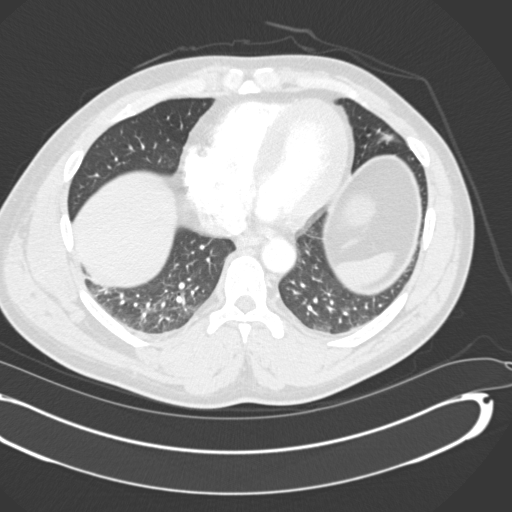
[im 46/127  mediastinal]
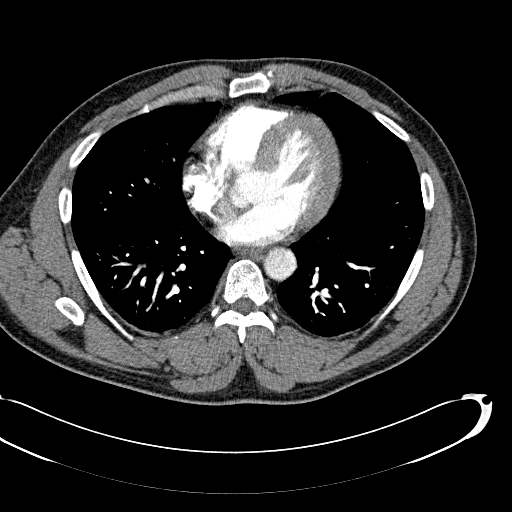
[im 52/127  lung]
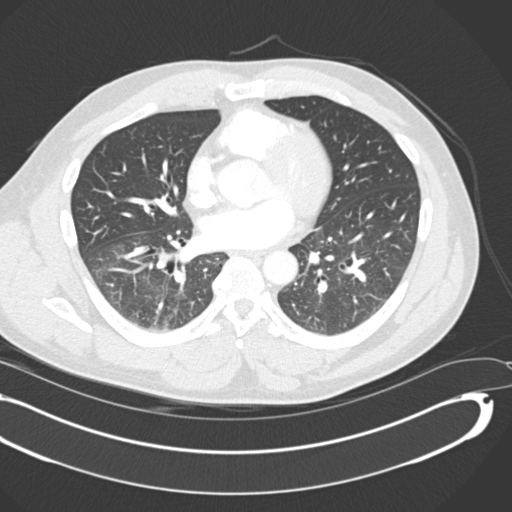
[im 58/127  mediastinal]
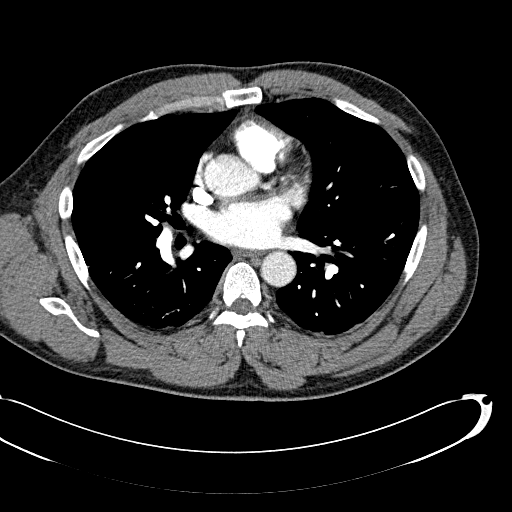
[im 69/127  lung]
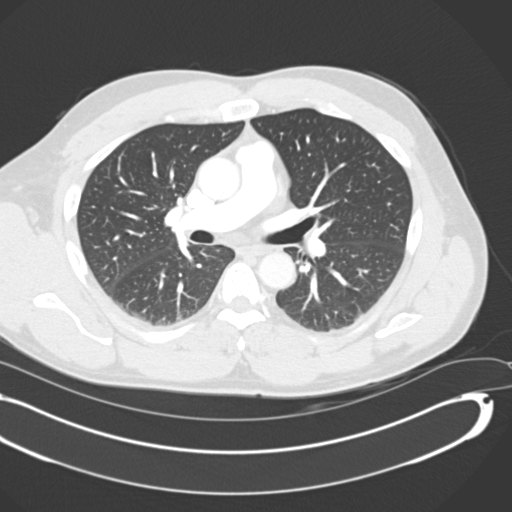
[im 75/127  mediastinal]
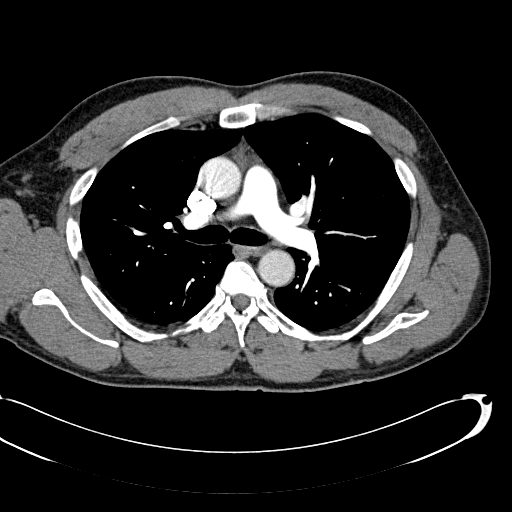
[im 81/127  lung]
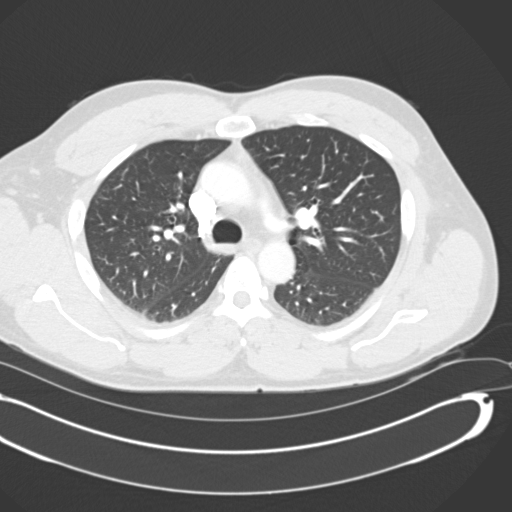
[im 92/127  mediastinal]
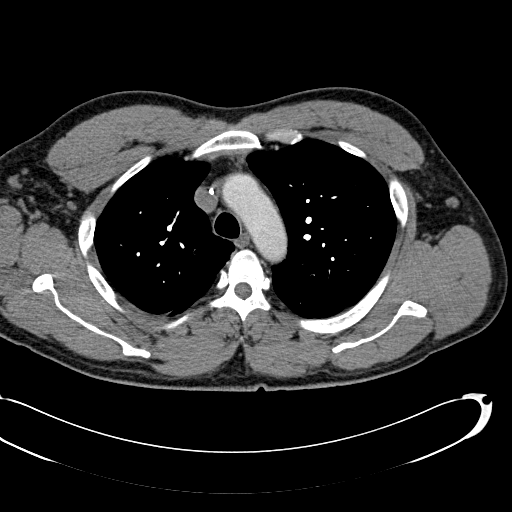
[im 98/127  lung]
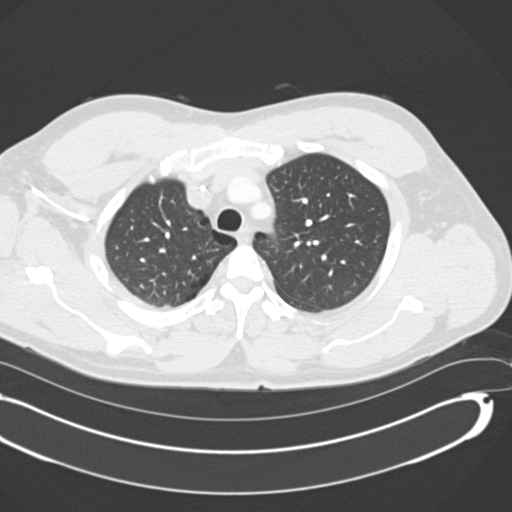
[im 104/127  mediastinal]
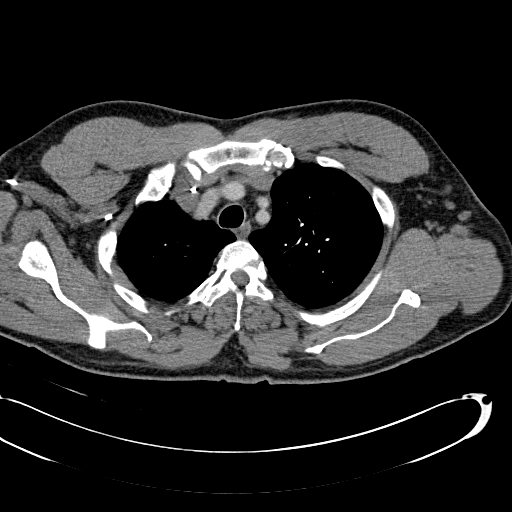
[im 115/127  lung]
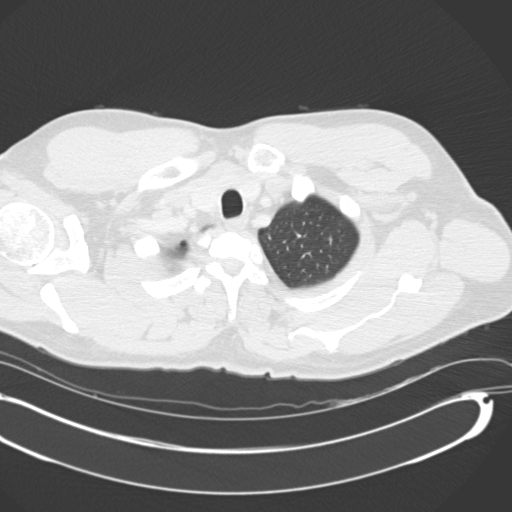
[im 121/127  mediastinal]
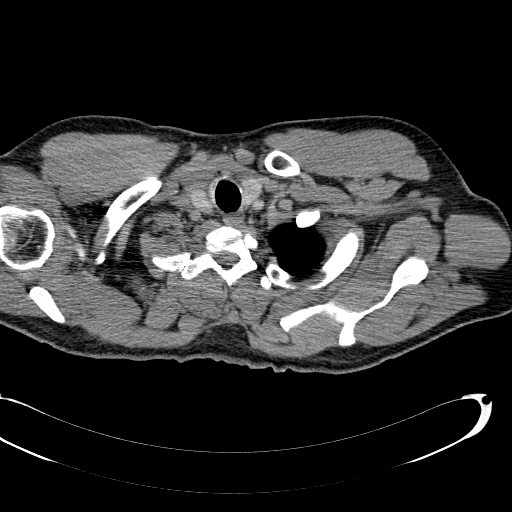

[Series 602: coronal · coronal · 0.76mm/px · 3 of 111 slices shown]
[im 23/111  mediastinal]
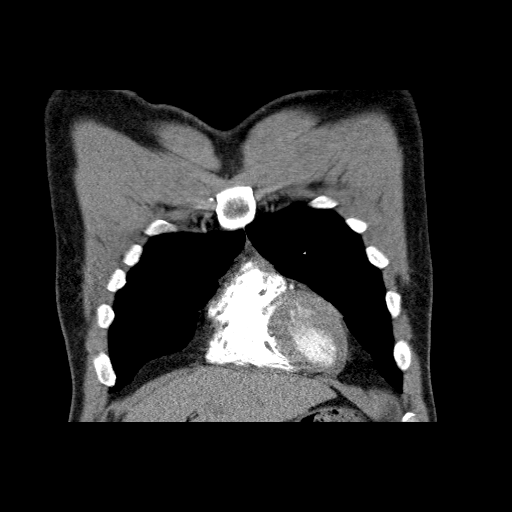
[im 45/111  mediastinal]
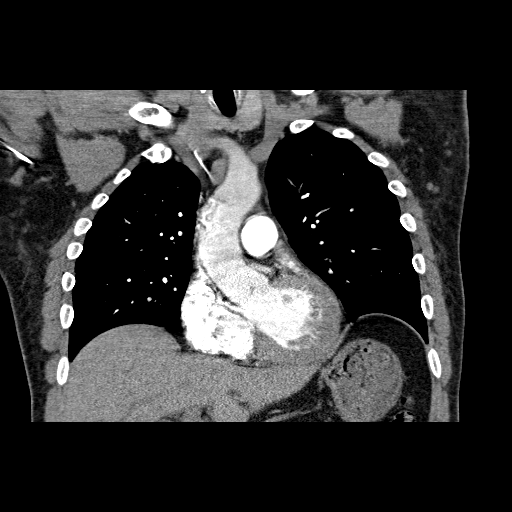
[im 67/111  mediastinal]
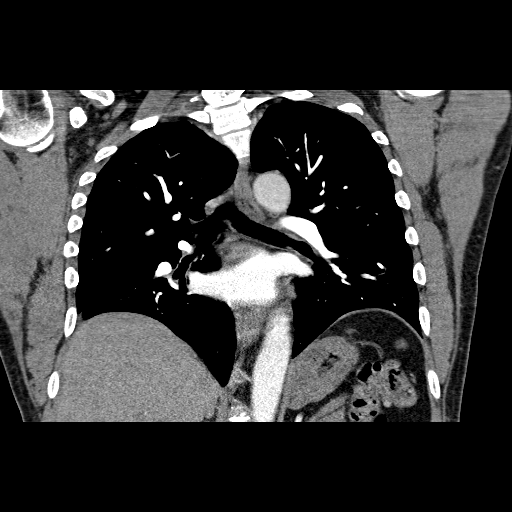

[19 of 36 positions shown; findings below may reference images not displayed]

FINDINGS: There is very minimal opacity within the right lower lobe which could be due to atelectasis or possible pneumonia.  No lung nodule is seen.  No definite acute pulmonary embolism is seen. There is some linear stranding in branches to the right lower lobe where emboli were present previously, consistent with chronic change.   The thoracic aorta opacifies with no acute abnormality.  No mediastinal or hilar adenopathy is seen.
IMPRESSION: 1.  No evidence of acute pulmonary embolism.
 2.  Minimal opacity in right lower lobe may represent atelectasis, scarring or developing pneumonia.  Follow-up by chest x-ray is recommended.

## 2008-10-23 IMAGING — CR DG CHEST 1V PORT
1 series · 1 of 1 positions shown · non-contrast
Comparison: none

CLINICAL DATA: Multiple sclerosis.   PICC line placement. 
 PORTABLE CHEST - 1 VIEW ? 06/01/07 AT 8158 HOURS:

[view not recorded]
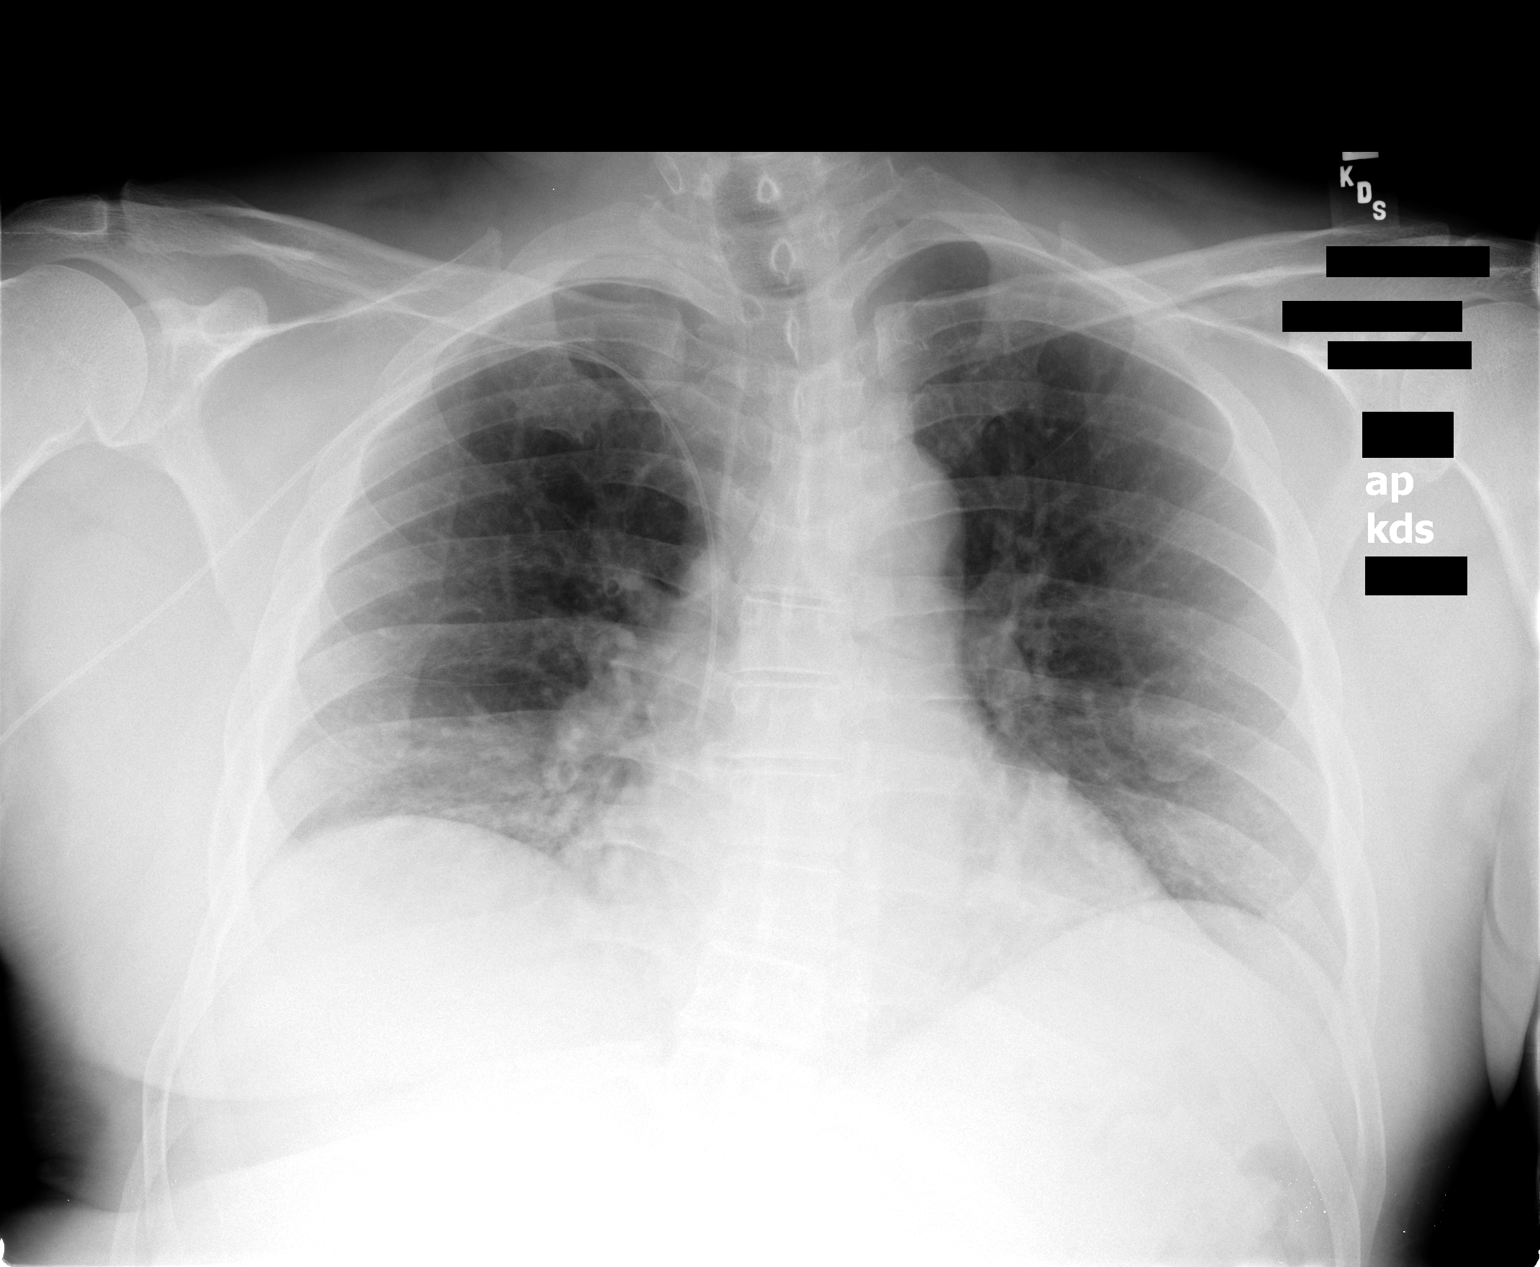

[1 of 1 positions shown; findings below may reference images not displayed]

FINDINGS: Right PICC line is present with the tip in the low SVC.  No pneumothorax is seen.  Compared to a chest x-ray of 12/13/05, the heart is borderline enlarged.
IMPRESSION: Right PICC tip in low SVC.  No pneumothorax.

## 2008-11-06 DIAGNOSIS — G4700 Insomnia, unspecified: Secondary | ICD-10-CM

## 2008-11-06 HISTORY — DX: Insomnia, unspecified: G47.00

## 2008-11-26 IMAGING — MR MR HEAD WO/W CM
7 of 10 series · 25 of 48 positions shown · IV contrast (magnevist)
Comparison: Most recent MR scan for comparison is from 07/28/04.

CLINICAL DATA: Known multiple sclerosis.  Ten days of headaches and dizziness.  
MRI BRAIN WITHOUT AND WITH CONTRAST:
TECHNIQUE: Multiplanar and multiecho pulse sequences of the brain and surrounding structures were obtained according to standard protocol before and after administration of intravenous contrast.
Contrast:  20 cc Magnevist.

[Series 1: 3 plane loc · axial · 5.0mm · 0.94mm/px · 1 of 9 slices shown]
[im 1/9]
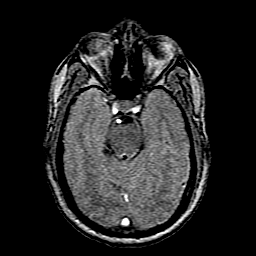

[Series 2: T1 · sagittal · 5.0mm · 0.43mm/px · 1 of 12 slices shown]
[im 1/12]
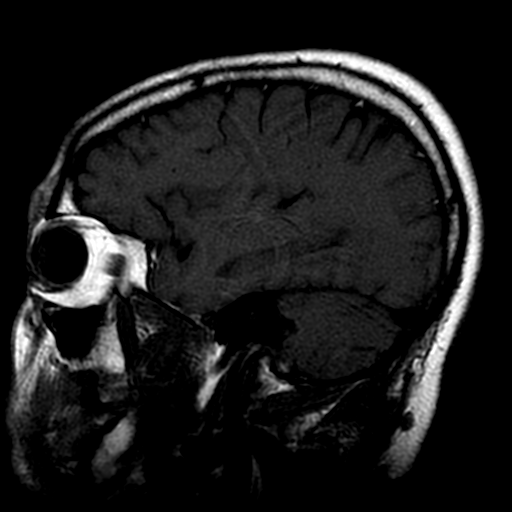

[Series 3: DWI · axial · 5.0mm · 1.41mm/px · z∈[-74,+91]mm · 8 of 62 slices shown]
[im 1/62]
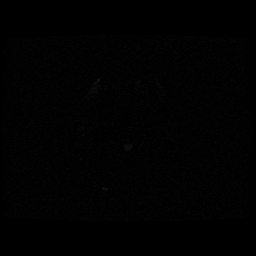
[im 7/62]
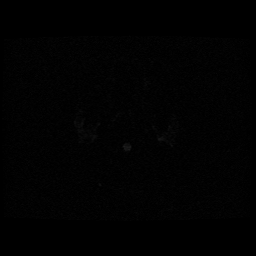
[im 21/62]
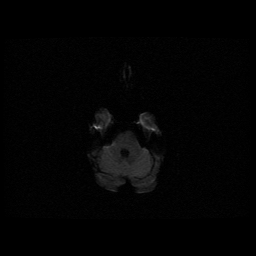
[im 28/62]
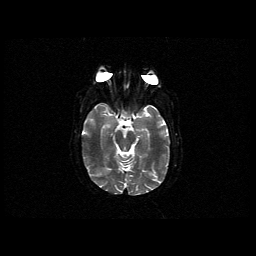
[im 34/62]
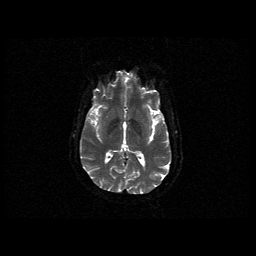
[im 41/62]
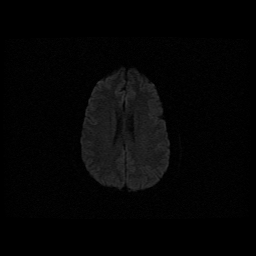
[im 55/62]
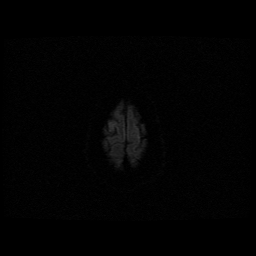
[im 62/62]
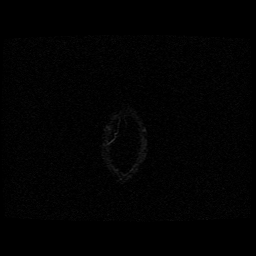

[Series 4: T2 · axial · 5.0mm · 0.47mm/px · z∈[-69,+78]mm · 4 of 22 slices shown (1 of 2)]
[im 1/22]
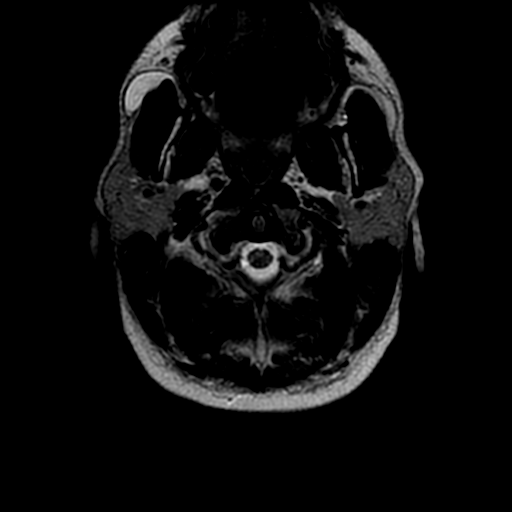
[im 8/22]
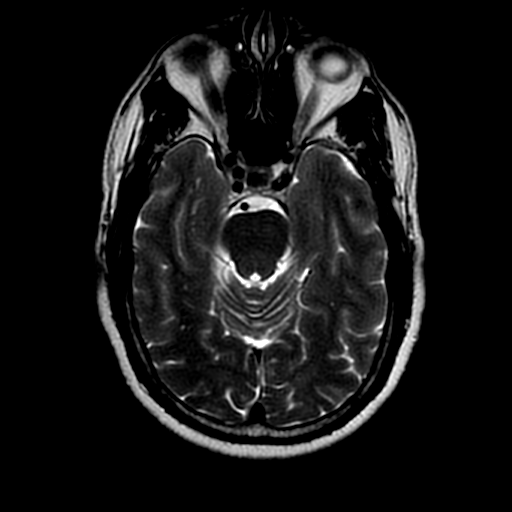
[im 15/22]
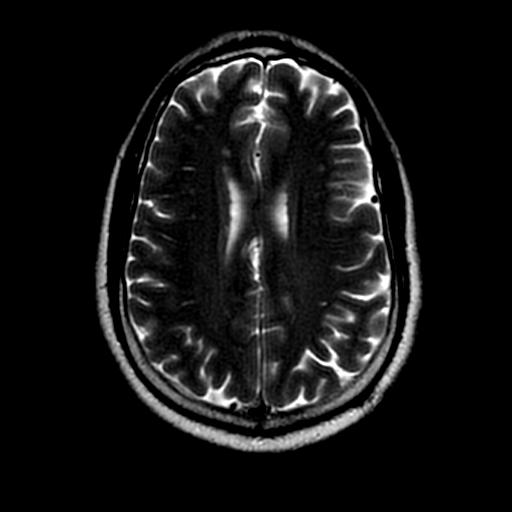
[im 22/22]
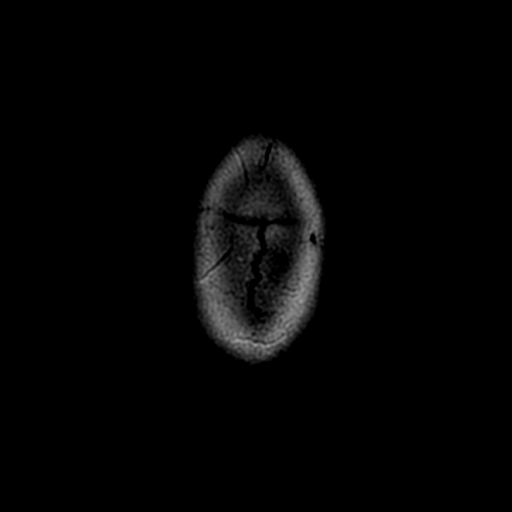

[Series 5: FLAIR · axial · 5.0mm · 0.47mm/px · z∈[-69,+78]mm · 4 of 22 slices shown]
[im 1/22]
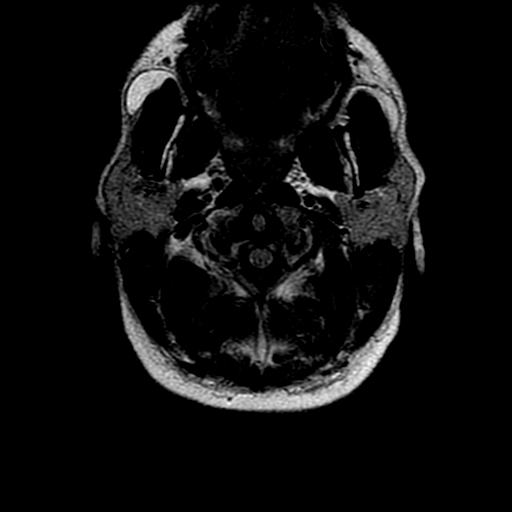
[im 8/22]
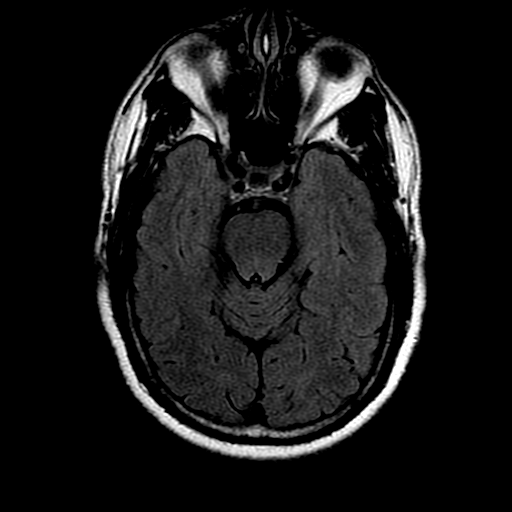
[im 15/22]
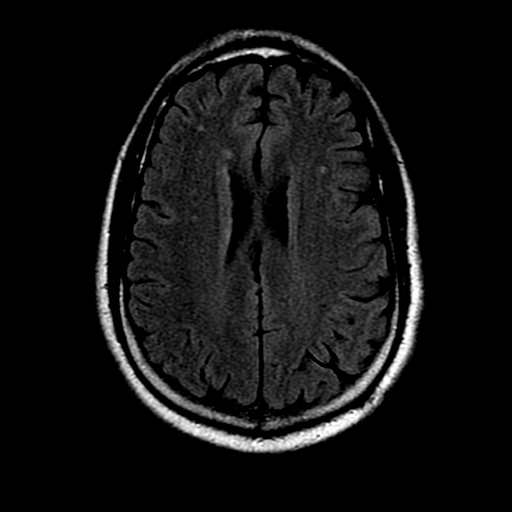
[im 22/22]
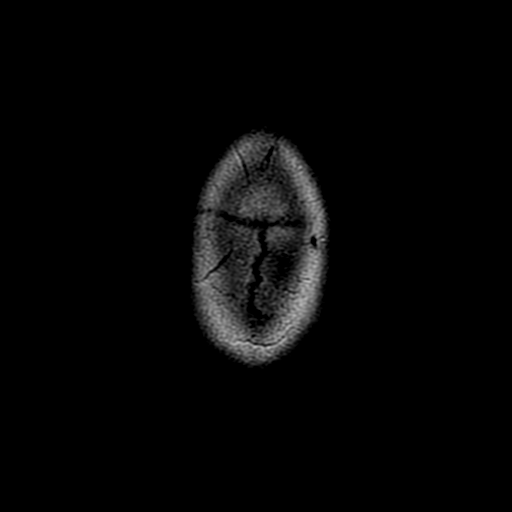

[Series 7: T2 · coronal · 5.0mm · 0.47mm/px · 4 of 24 slices shown (2 of 2)]
[im 1/24]
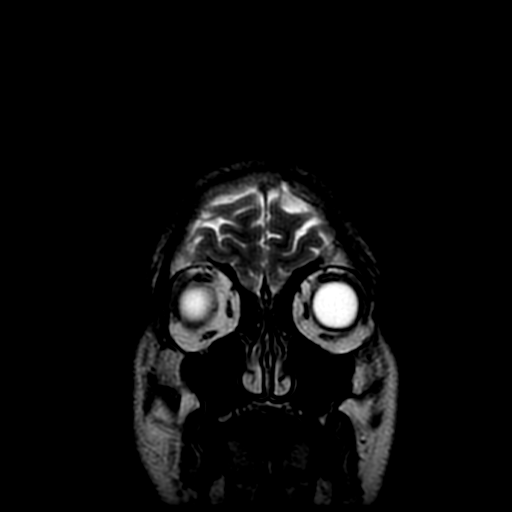
[im 8/24]
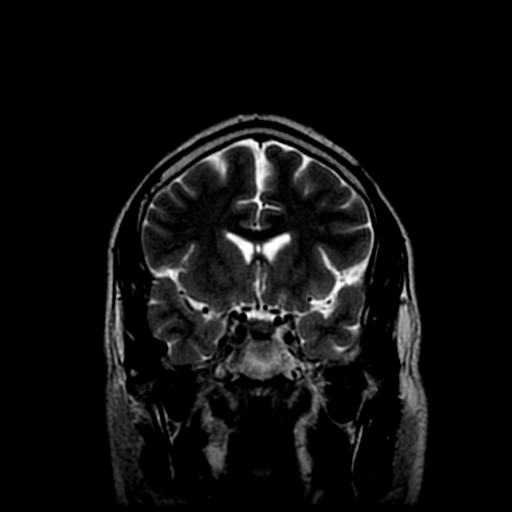
[im 16/24]
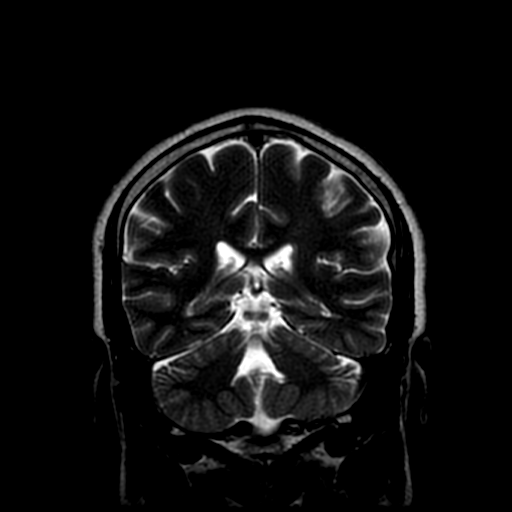
[im 24/24]
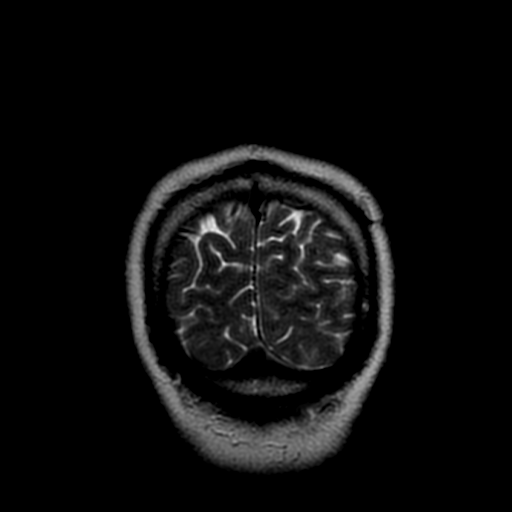

[Series 8: T2-star · axial · 5.0mm · 0.47mm/px · z∈[-69,+29]mm · 3 of 22 slices shown]
[im 1/22]
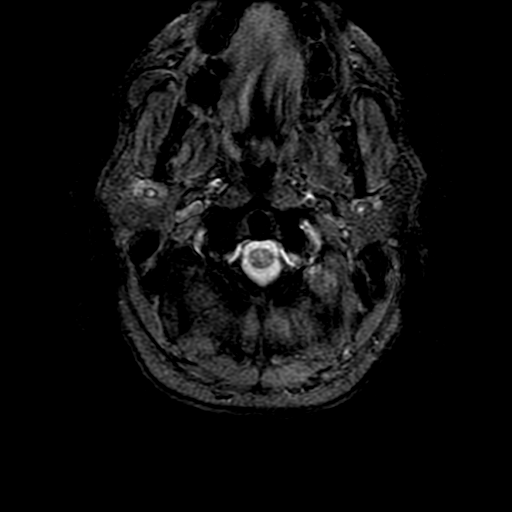
[im 8/22]
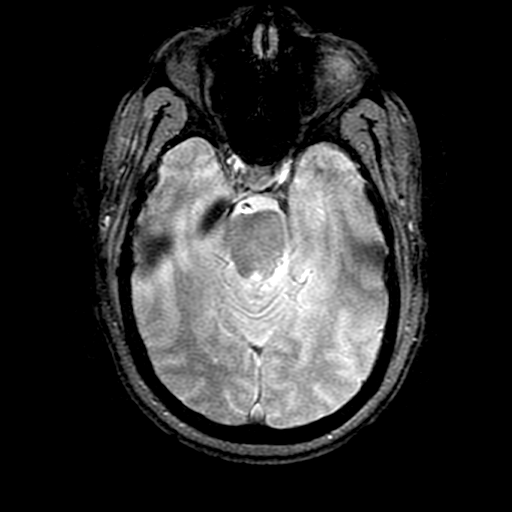
[im 15/22]
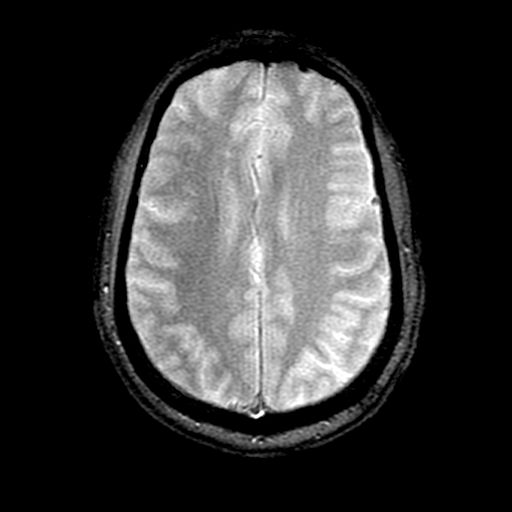

[25 of 48 positions shown; findings below may reference images not displayed]

FINDINGS: No acute infarct or abnormal intracranial enhancing lesion.  Scattered small number of nonspecific white matter-type changes consistent with the patient?s history of multiple sclerosis.  None of these enhance or demonstrate restricted water motion to suggest acute demyelination.  When compared to the most recent exam of 07/28/04, there are a few new lesions (for instance left frontal lobe on series 5 images 15 and 16).  No intracranial hemorrhage (subarachnoid hemorrhage cannot be excluded by MR).  Major intracranial vascular structures appear patent. 
Similar appearance of slightly prominent pituitary gland and clivus which contains evidence of prior synchondrosis.      Mild degenerative changes C3-4.   junction, and orbital structures unremarkable.  Visualized sinuses and mastoid air cells are clear.
IMPRESSION: 1.  Since the prior MR scan of 07/28/04, there has been a slight increase in the number of small white matter lesions consistent with the patient?s history of multiple sclerosis.  None of these demonstrate abnormal enhancement or restricted water motion to suggest the presence of active plaques.  
2.  No acute infarct.

## 2008-11-26 IMAGING — CT CT HEAD W/O CM
1 of 2 series · 13 of 30 positions shown, 17 images · IV contrast (agent unspecified)
Comparison: 03/27/07.

CLINICAL DATA: Headache.
 HEAD CT WITHOUT CONTRAST:
TECHNIQUE: Contiguous axial images were obtained from the base of the skull through the vertex according to standard protocol without contrast.

[Series 2: brain · axial · 0.47mm/px · z∈[+142,+282]mm · 13 of 32 slices shown, 17 images]
[im 3/32  brain]
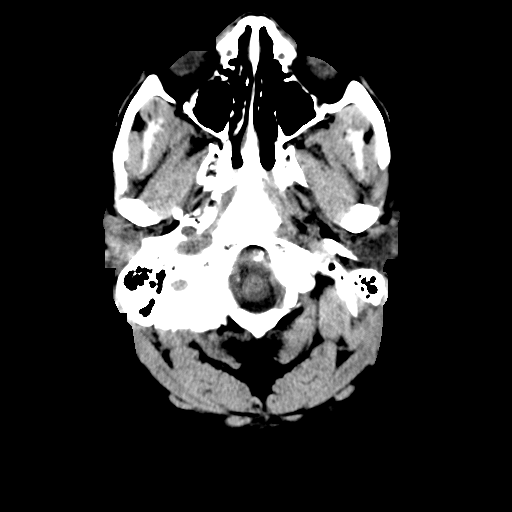
[im 3/32  bone]
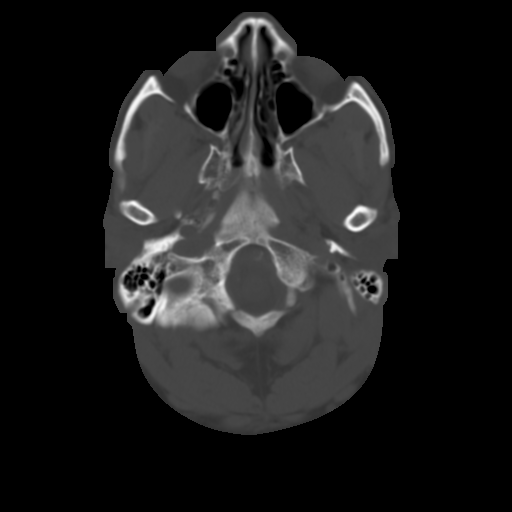
[im 5/32  brain]
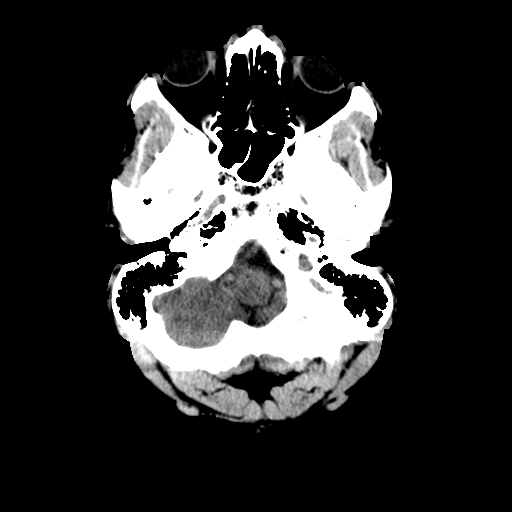
[im 7/32  brain]
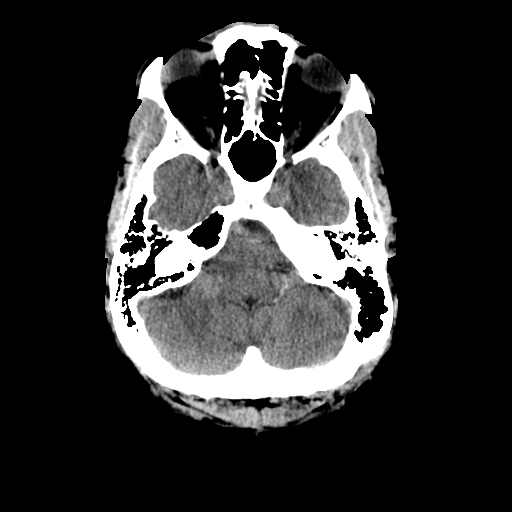
[im 9/32  brain]
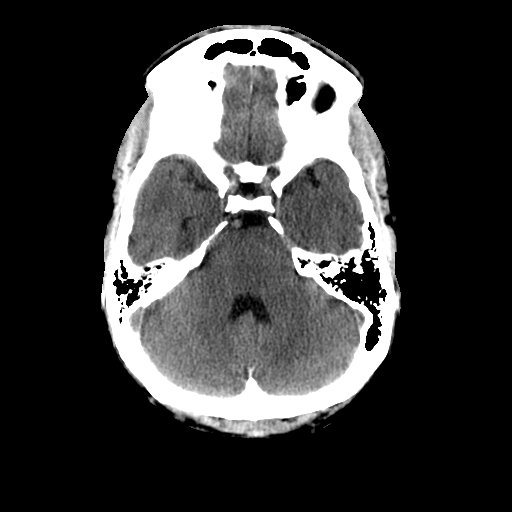
[im 12/32  brain]
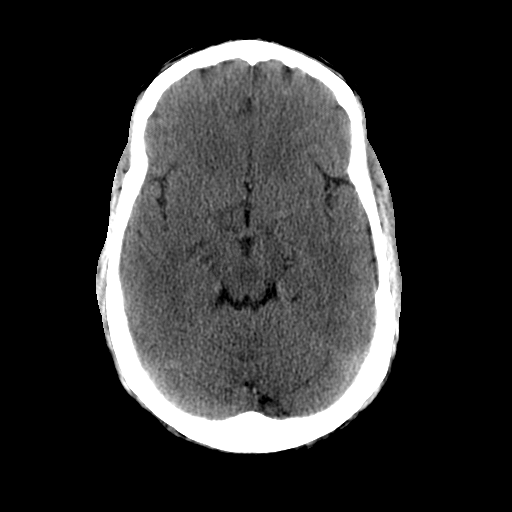
[im 12/32  bone]
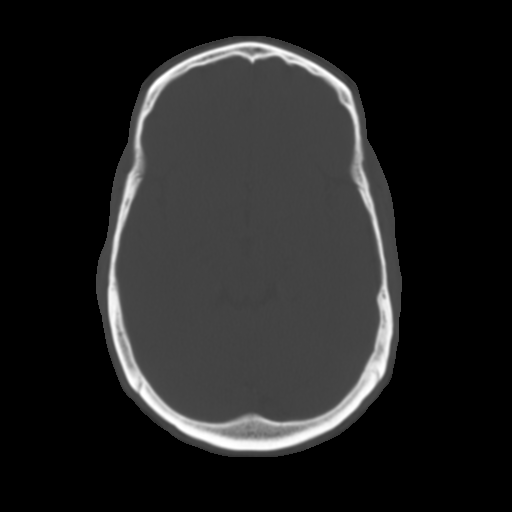
[im 14/32  brain]
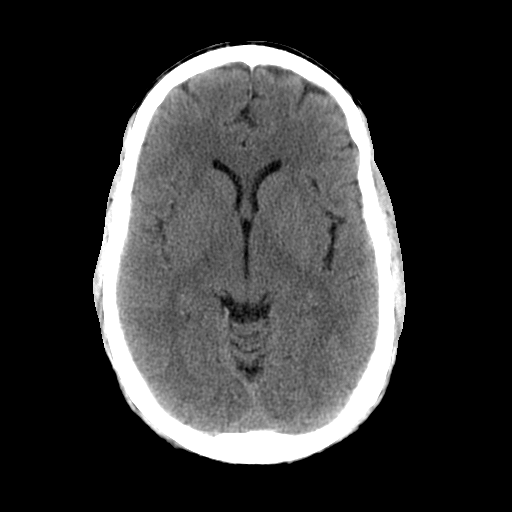
[im 16/32  brain]
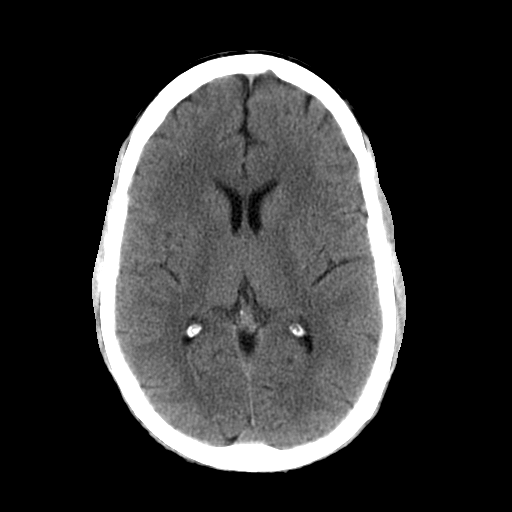
[im 18/32  brain]
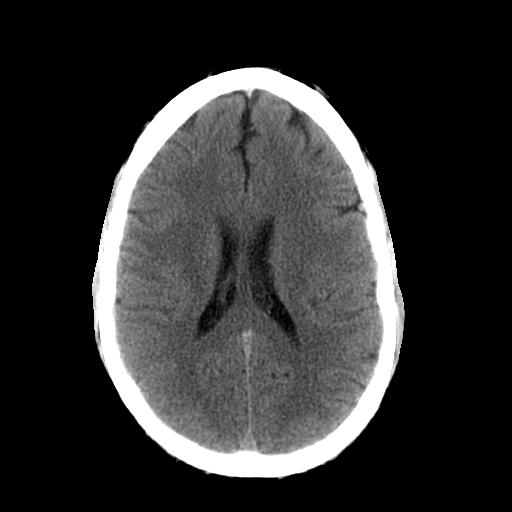
[im 20/32  brain]
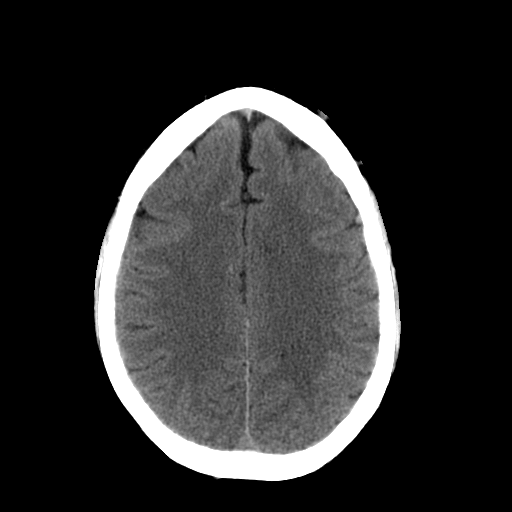
[im 20/32  bone]
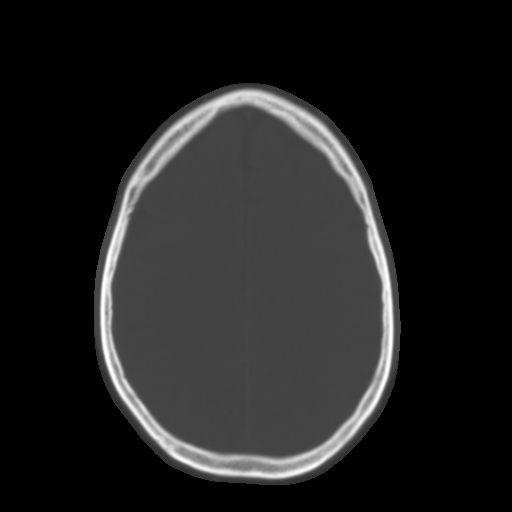
[im 23/32  brain]
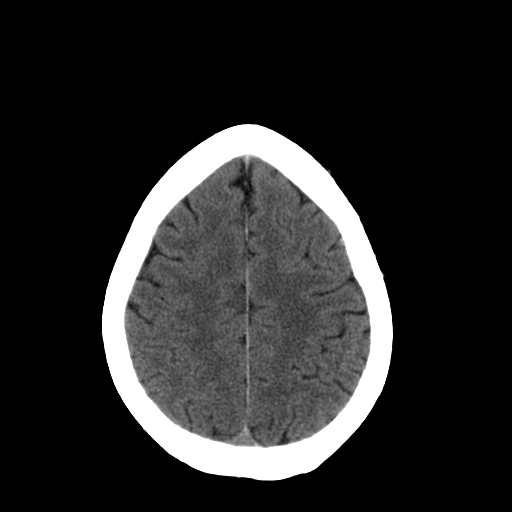
[im 25/32  brain]
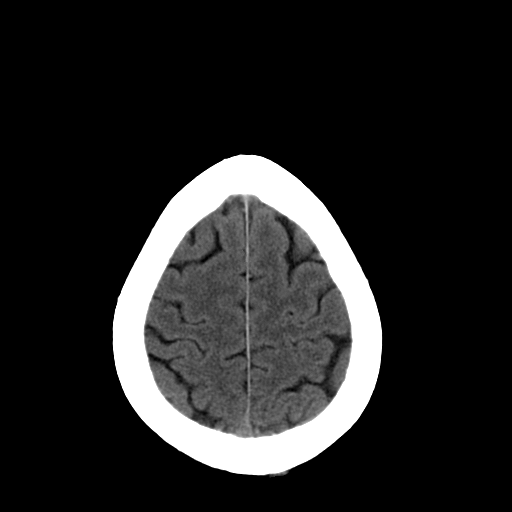
[im 27/32  brain]
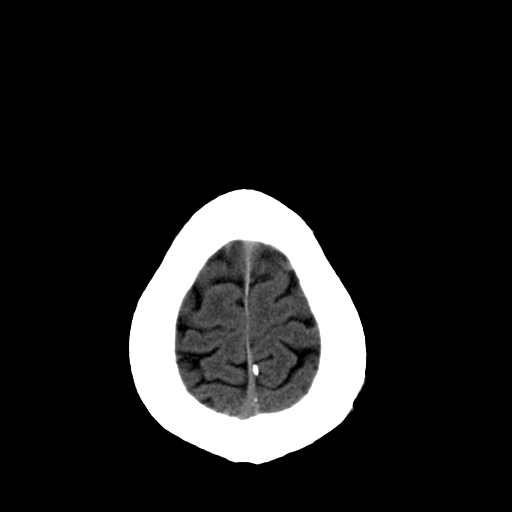
[im 29/32  brain]
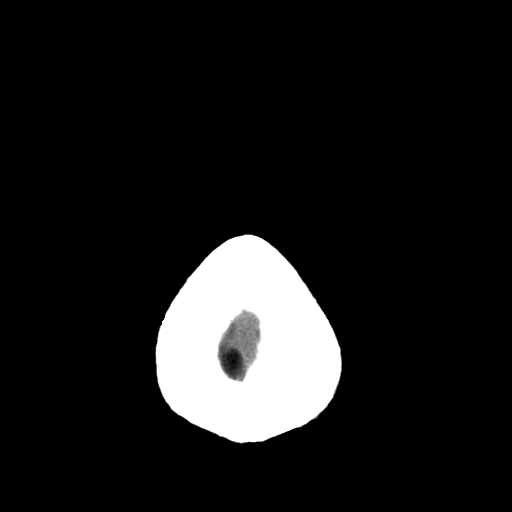
[im 29/32  bone]
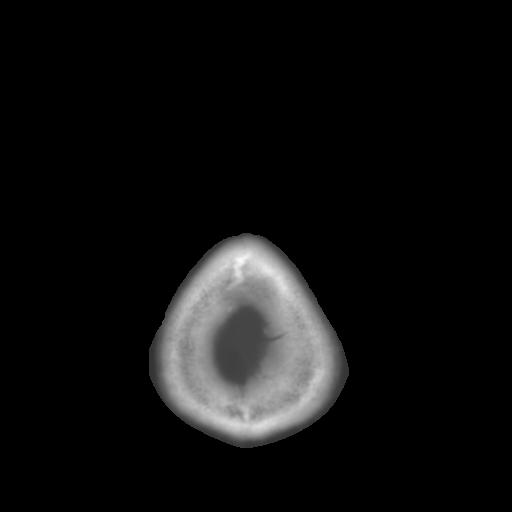

[13 of 30 positions shown; findings below may reference images not displayed]

FINDINGS: Intracranial structures are within normal limits.  Normal appearance of the ventricles and basal cisterns.  Negative for acute hemorrhage, mass lesion, midline shift of hydrocephalus.  Paranasal sinuses and mastoid air cells are clear. No acute bone abnormalities.
IMPRESSION: No acute intracranial abnormality.

## 2009-01-01 ENCOUNTER — Encounter (HOSPITAL_COMMUNITY): Admission: RE | Admit: 2009-01-01 | Discharge: 2009-04-01 | Payer: Self-pay | Admitting: Neurology

## 2009-01-24 ENCOUNTER — Encounter: Admission: RE | Admit: 2009-01-24 | Discharge: 2009-01-24 | Payer: Self-pay | Admitting: Neurology

## 2009-01-25 ENCOUNTER — Encounter: Admission: RE | Admit: 2009-01-25 | Discharge: 2009-01-25 | Payer: Self-pay | Admitting: Neurology

## 2009-02-02 ENCOUNTER — Ambulatory Visit (HOSPITAL_COMMUNITY): Admission: RE | Admit: 2009-02-02 | Discharge: 2009-02-02 | Payer: Self-pay | Admitting: Neurology

## 2009-02-17 ENCOUNTER — Inpatient Hospital Stay (HOSPITAL_COMMUNITY): Admission: EM | Admit: 2009-02-17 | Discharge: 2009-02-23 | Payer: Self-pay | Admitting: Emergency Medicine

## 2009-02-17 DIAGNOSIS — K8020 Calculus of gallbladder without cholecystitis without obstruction: Secondary | ICD-10-CM

## 2009-02-17 HISTORY — DX: Calculus of gallbladder without cholecystitis without obstruction: K80.20

## 2009-02-20 ENCOUNTER — Encounter (INDEPENDENT_AMBULATORY_CARE_PROVIDER_SITE_OTHER): Payer: Self-pay | Admitting: Surgery

## 2009-02-20 HISTORY — PX: CHOLECYSTECTOMY: SHX55

## 2009-02-21 ENCOUNTER — Ambulatory Visit: Payer: Self-pay | Admitting: Internal Medicine

## 2009-02-22 ENCOUNTER — Encounter: Payer: Self-pay | Admitting: Internal Medicine

## 2009-02-26 ENCOUNTER — Encounter (INDEPENDENT_AMBULATORY_CARE_PROVIDER_SITE_OTHER): Payer: Self-pay | Admitting: Internal Medicine

## 2009-02-26 ENCOUNTER — Ambulatory Visit: Payer: Self-pay | Admitting: Vascular Surgery

## 2009-02-26 ENCOUNTER — Inpatient Hospital Stay (HOSPITAL_COMMUNITY): Admission: EM | Admit: 2009-02-26 | Discharge: 2009-03-03 | Payer: Self-pay | Admitting: Emergency Medicine

## 2009-02-27 ENCOUNTER — Encounter (INDEPENDENT_AMBULATORY_CARE_PROVIDER_SITE_OTHER): Payer: Self-pay | Admitting: Internal Medicine

## 2009-03-08 ENCOUNTER — Inpatient Hospital Stay (HOSPITAL_COMMUNITY): Admission: EM | Admit: 2009-03-08 | Discharge: 2009-03-12 | Payer: Self-pay | Admitting: Emergency Medicine

## 2009-03-13 ENCOUNTER — Ambulatory Visit: Payer: Self-pay | Admitting: Hematology and Oncology

## 2009-04-02 ENCOUNTER — Encounter (HOSPITAL_COMMUNITY): Admission: RE | Admit: 2009-04-02 | Discharge: 2009-07-01 | Payer: Self-pay | Admitting: Neurology

## 2009-05-28 ENCOUNTER — Emergency Department (HOSPITAL_COMMUNITY): Admission: EM | Admit: 2009-05-28 | Discharge: 2009-05-28 | Payer: Self-pay | Admitting: Emergency Medicine

## 2009-06-27 ENCOUNTER — Telehealth: Payer: Self-pay | Admitting: Internal Medicine

## 2009-06-27 ENCOUNTER — Encounter: Payer: Self-pay | Admitting: Internal Medicine

## 2009-06-28 ENCOUNTER — Ambulatory Visit (HOSPITAL_COMMUNITY): Admission: RE | Admit: 2009-06-28 | Discharge: 2009-06-28 | Payer: Self-pay | Admitting: Internal Medicine

## 2009-06-28 ENCOUNTER — Encounter: Payer: Self-pay | Admitting: Internal Medicine

## 2009-07-03 ENCOUNTER — Telehealth (INDEPENDENT_AMBULATORY_CARE_PROVIDER_SITE_OTHER): Payer: Self-pay

## 2009-07-17 ENCOUNTER — Encounter: Admission: RE | Admit: 2009-07-17 | Discharge: 2009-07-17 | Payer: Self-pay | Admitting: Neurology

## 2009-08-30 IMAGING — XA IR FLUORO GUIDE CV LINE*R*
1 series · 1 of 1 positions shown · non-contrast
Comparison: none

CLINICAL DATA: Multiple sclerosis. PICC line requested for IV
steroids.

UPPER EXTREMITY PICC PLACEMENT WITH ULTRASOUND AND FLUORO GUIDANCE
TECHNIQUE: The right arm was prepped with chlorhexidine, draped in
the usual sterile fashion using maximum barrier technique and
infiltrated locally with 1% Lidocaine.  Ultrasound demonstrated
patency of the right basilic vein.  Under real-time ultrasound
guidance, this vein was accessed with a 21 gauge micropuncture
needle.  Ultrasound image documentation was performed.  The needle
was exchanged over a guidewire for a peel-away sheath through which
a five French single lumen PICC trimmed to 37cm was advanced,
positioned with its tip at the distal SVC/right atrial junction.
Fluoroscopy during the procedure and fluoro spot radiograph
confirms appropriate catheter position.  The catheter was flushed,
secured to the skin with Prolene sutures, and covered with a
sterile dressing.  No immediate complication.
Fluoroscopy Time: 0.2 minutes.

[Series 1: run · 1 of 1 slices shown]
[im 1/1]
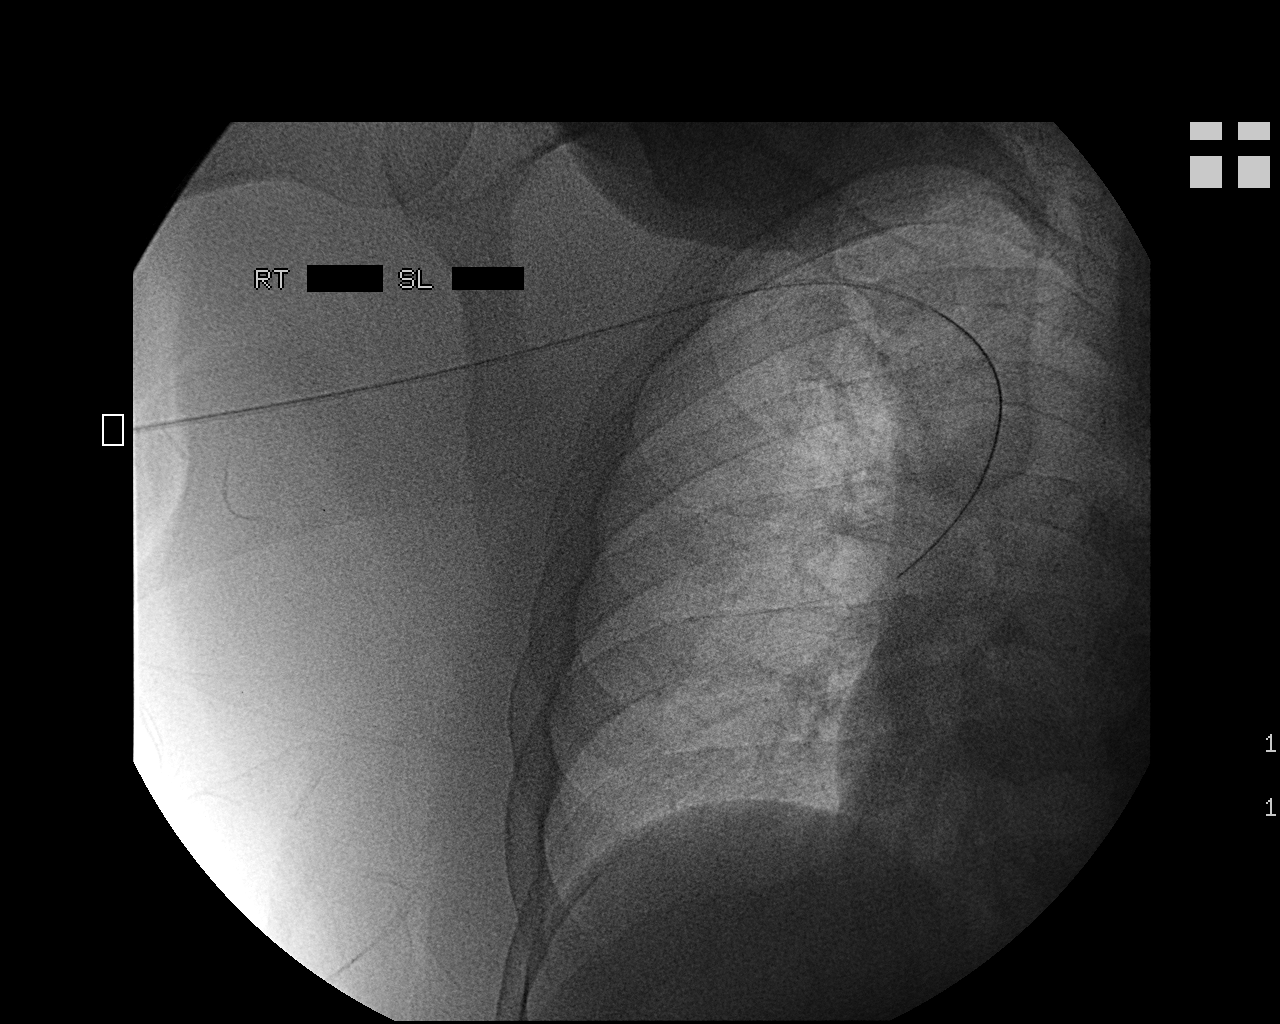

[1 of 1 positions shown; findings below may reference images not displayed]

IMPRESSION: Technically successful right arm PICC placement with ultrasound and
fluoroscopic guidance.  The catheter is ready for use.

Read by: Ledion, Shqipdona.-MELON

## 2009-09-06 ENCOUNTER — Encounter: Payer: Self-pay | Admitting: Neurology

## 2009-09-10 ENCOUNTER — Encounter: Payer: Self-pay | Admitting: Neurology

## 2010-02-12 IMAGING — CR DG CHEST 1V PORT
1 series · 1 of 1 positions shown · non-contrast
Comparison: Portable chest 06/01/2007.

CLINICAL DATA: Shortness of breath.

PORTABLE CHEST - 1 VIEW

[view not recorded]
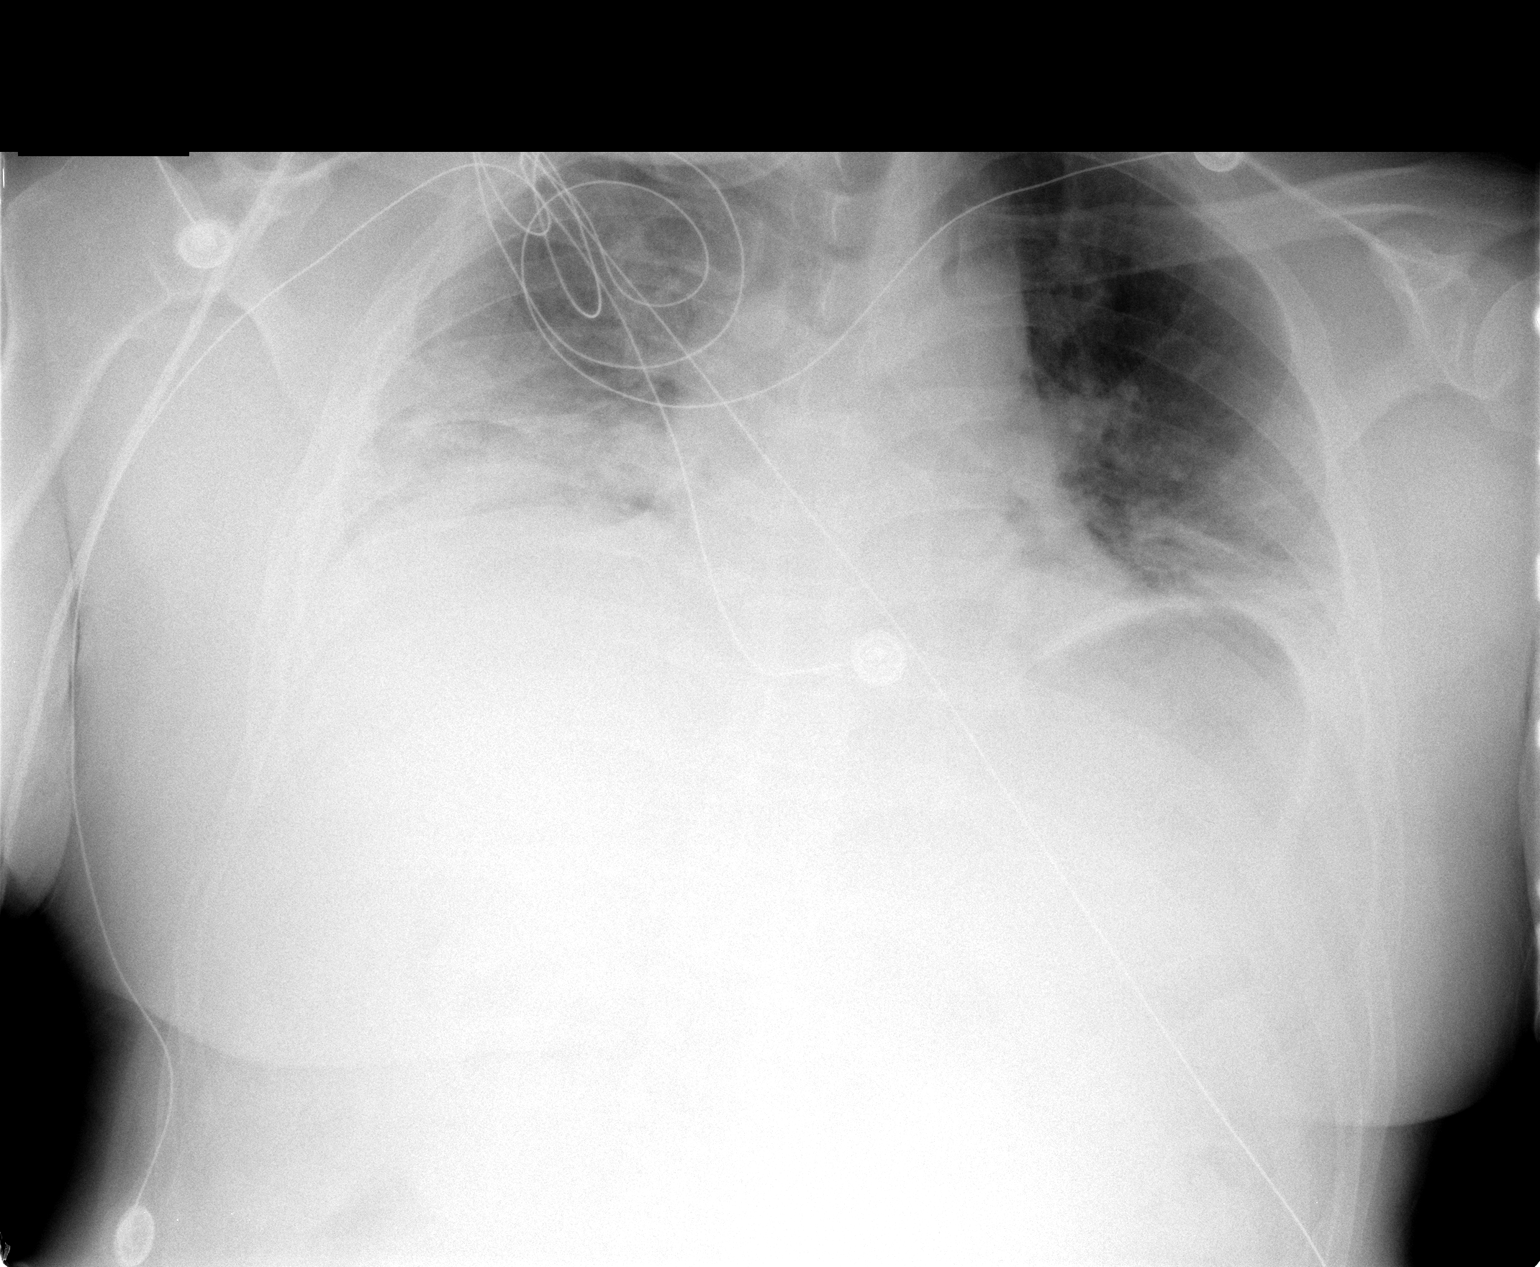

[1 of 1 positions shown; findings below may reference images not displayed]

FINDINGS: Lung volumes are low with bibasilar airspace opacities,
greater on the right.  Heart size is upper normal.  There is likely
a right pleural effusion.
IMPRESSION: 1.  Low lung volumes with right worse than left airspace disease
which could be due to pulmonary edema or pneumonia.
2.  Likely right effusion.

## 2010-02-13 IMAGING — CR DG CHEST 1V PORT
1 series · 1 of 1 positions shown · non-contrast
Comparison: 6677 hours

CLINICAL DATA: Respiratory failure and status post central line
placement.

PORTABLE CHEST - 1 VIEW

[view not recorded]
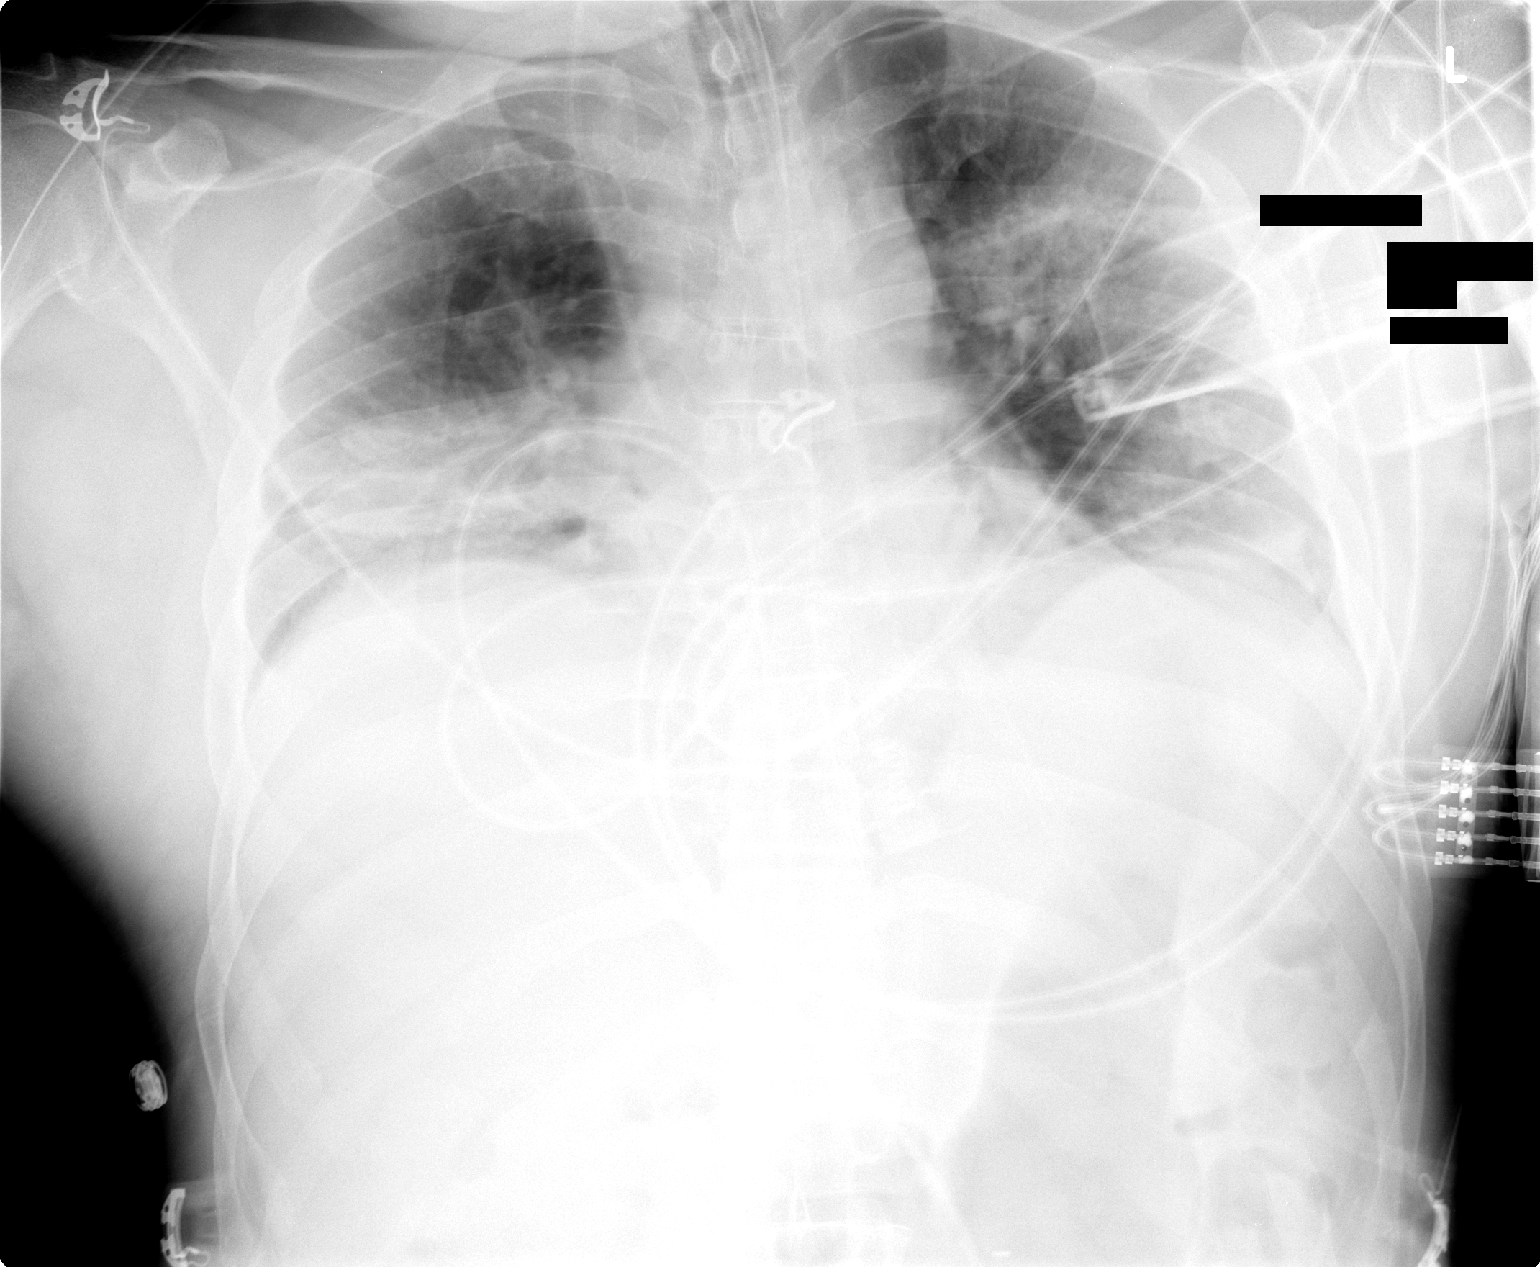

[1 of 1 positions shown; findings below may reference images not displayed]

FINDINGS: Film at 0444 hours demonstrates interval placement of
right jugular central line.  The tip lies at the juncture of the
SVC and right atrium.  No pneumothorax is visualized.  Significant
bilateral airspace disease and atelectasis is stable.  Endotracheal
tube positioning also stable with tip estimated to be 3.5 cm above
the carina.
IMPRESSION: Newly placed central line tip lies at juncture of the SVC and right
atrium.  No pneumothorax visualized.

## 2010-02-13 IMAGING — CT CT ABDOMEN W/ CM
2 of 4 series · 14 of 32 positions shown, 19 images · IV contrast (APPLIED)
Comparison: CT abdomen 02/11/2005

CT ABDOMEN
COMPARISON: CT abdomen 02/11/2005

CT ABDOMEN

Addendum Begins

The report was inadvertently terminated before completion.  Below
is the complete report:
CLINICAL DATA: Dyspnea, renal insufficiency
CT ABDOMEN AND PELVIS WITH CONTRAST
TECHNIQUE: Multidetector CT imaging of the abdomen and pelvis was
performed using the standard protocol following bolus
administration of intravenous contrast.
Contrast: 100 ml Omni 300
Contrast: 100 ml  Omni 300

[Series 5: abd_pel 5.0 b40s st · axial · 0.74mm/px · z∈[+776,+1152]mm · 6 of 106 slices shown, 11 images]
[im 16/106  soft-tissue]
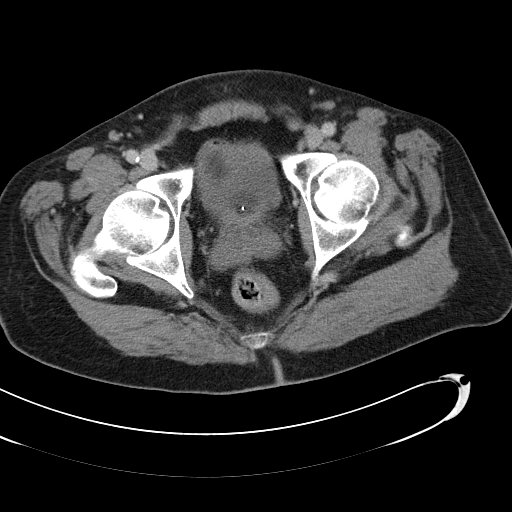
[im 16/106  bone]
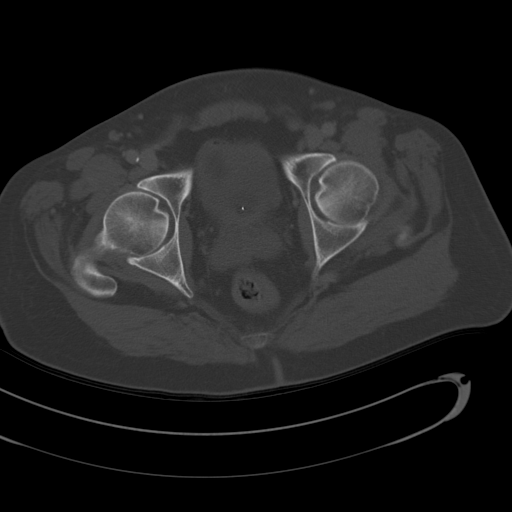
[im 31/106  soft-tissue]
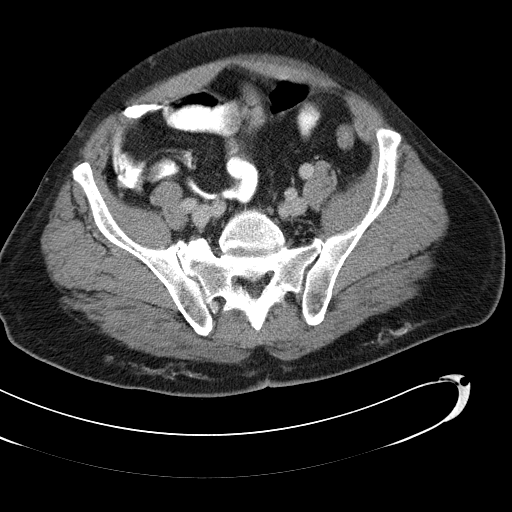
[im 46/106  soft-tissue]
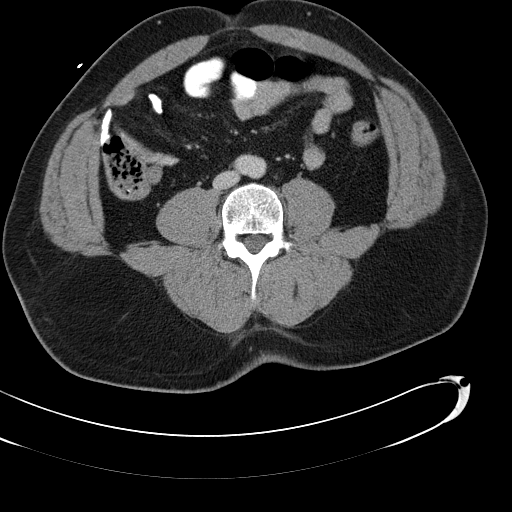
[im 46/106  lung]
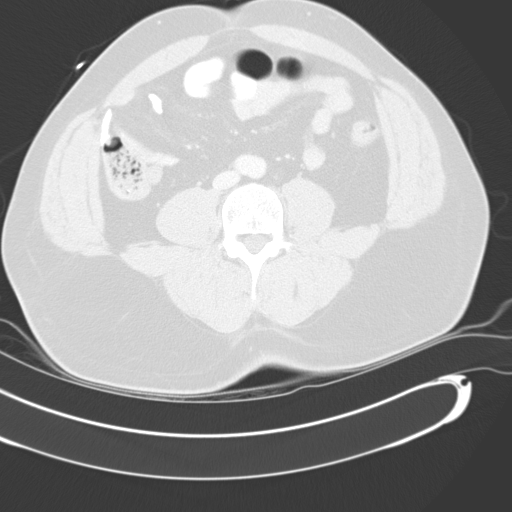
[im 61/106  soft-tissue]
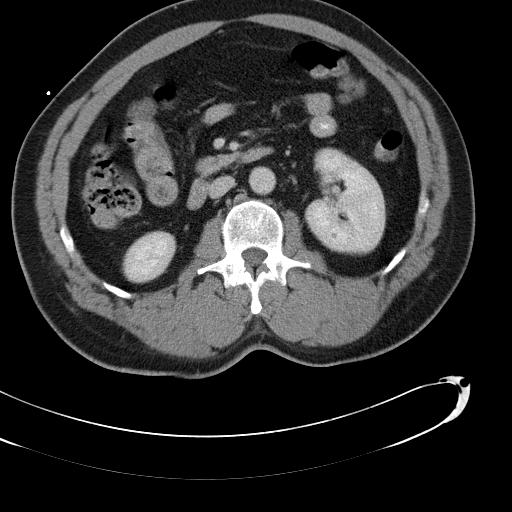
[im 61/106  lung]
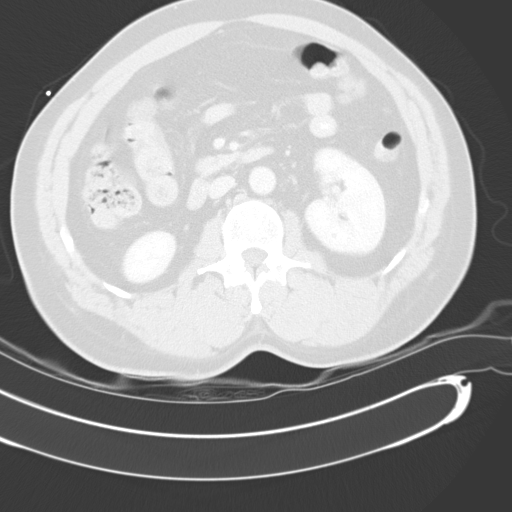
[im 76/106  soft-tissue]
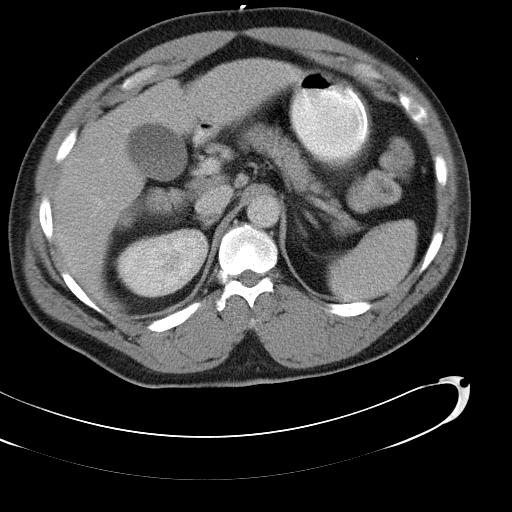
[im 76/106  lung]
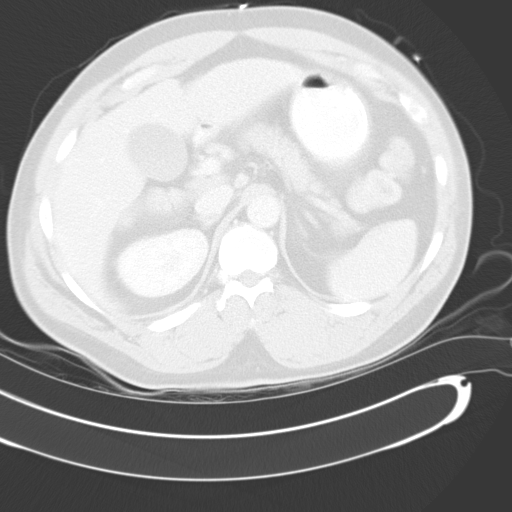
[im 91/106  soft-tissue]
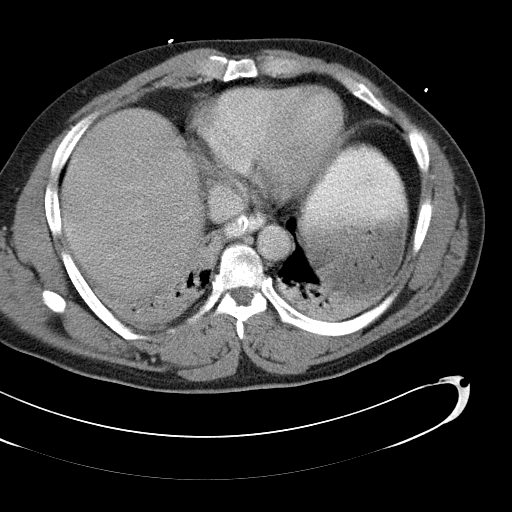
[im 91/106  lung]
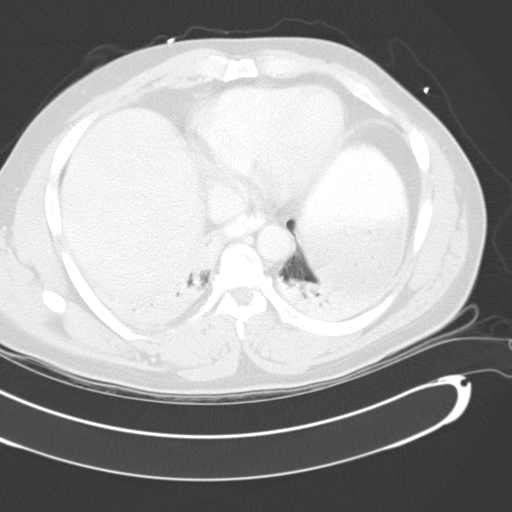

[Series 8: pe 1.0 b20f st · axial · 0.75mm/px · z∈[+1127,+1323]mm · 8 of 228 slices shown]
[im 16/228  soft-tissue]
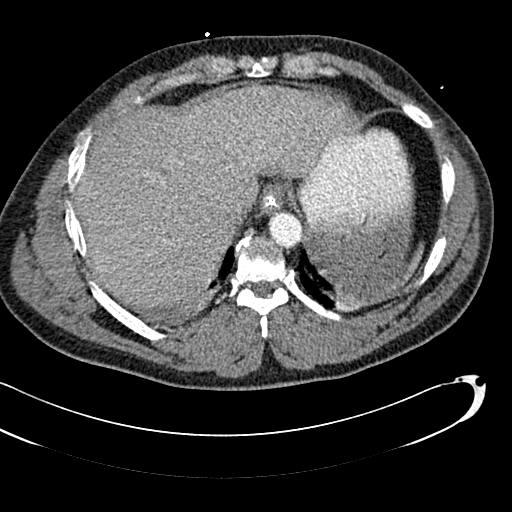
[im 46/228  soft-tissue]
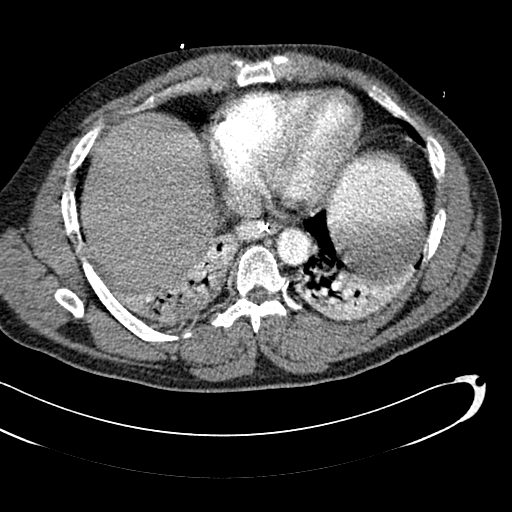
[im 76/228  soft-tissue]
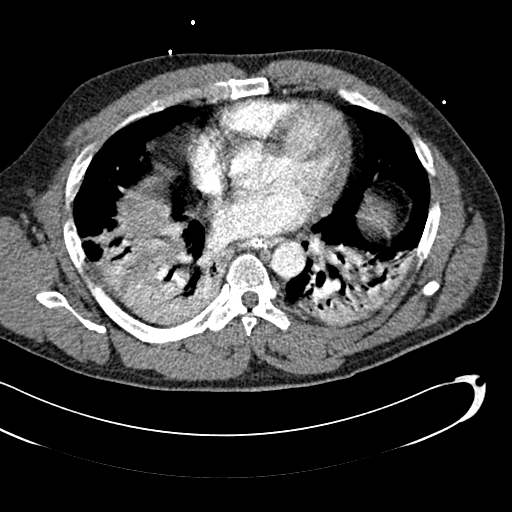
[im 106/228  soft-tissue]
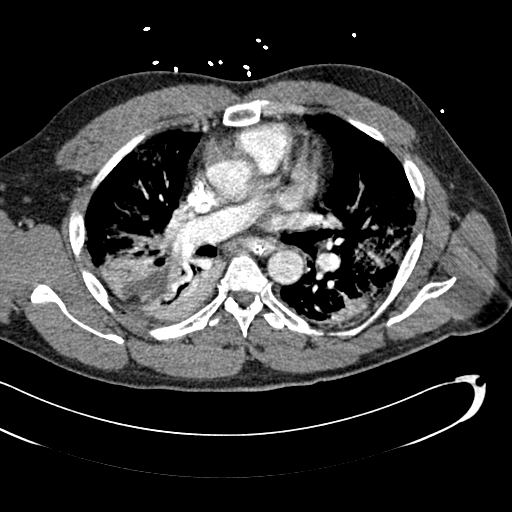
[im 122/228  soft-tissue]
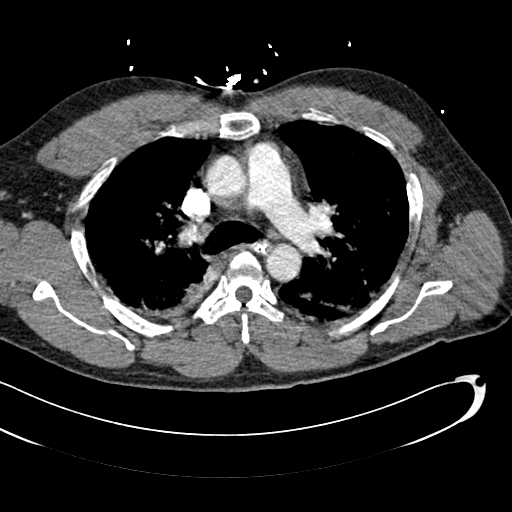
[im 152/228  soft-tissue]
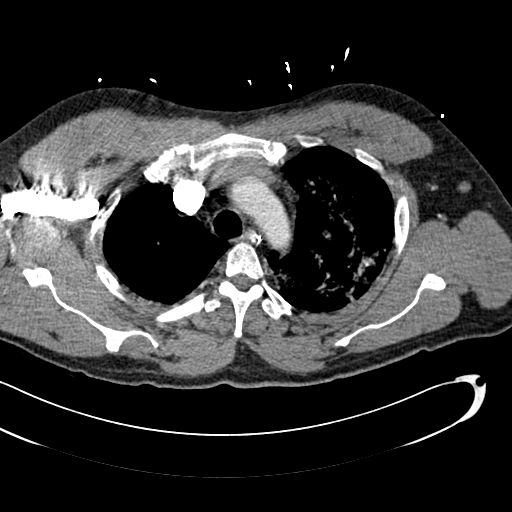
[im 182/228  soft-tissue]
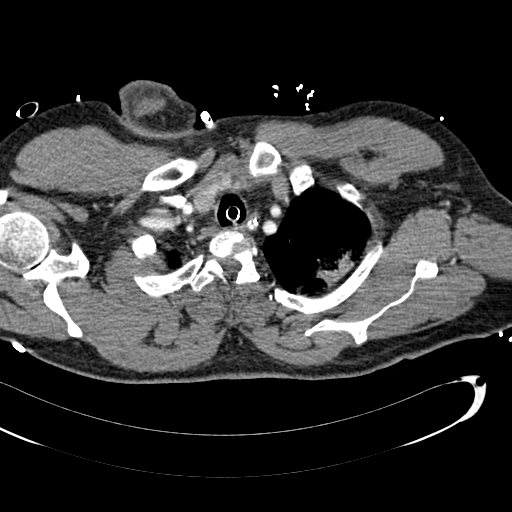
[im 212/228  soft-tissue]
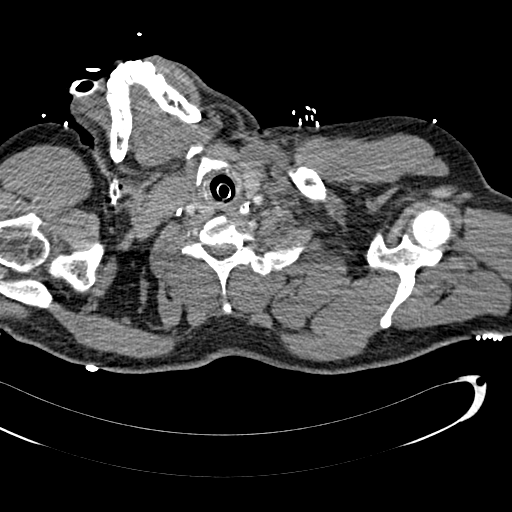

[14 of 32 positions shown; findings below may reference images not displayed]

FINDINGS: NG tube within stomach. No focal hepatic lesion. Dense
bibasilar pneumonia. The gallbladder, pancreas, spleen, adrenal
glands, and kidneys appear normal.

The stomach, duodenum, small bowel and cecum appear normal.  No
evidence of bowel obstruction.  The colon appears normal.

Abdominal aorta is normal in caliber.  No evidence of
retroperitoneal lymphadenopathy.
IMPRESSION: No acute abdominal process. Dense bibasilar pneumonia.

CT PELVIS
FINDINGS: No free fluid in the pelvis.  Foley catheter within the bladder.
Prostate seminal vesicles appear normal.  The rectum and sigmoid
colon appears normal.

No evidence of pelvic lymphadenopathy. Review of  bone windows
demonstrates no aggressive osseous lesions.
IMPRESSION: No acute pelvic process.

Addendum Ends
FINDINGS: NG tube within stomach.  No focal hepatic lesion.  Dense
bibasilar pneumonia.  The gallbladder, pancreas, spleen, adrenal
glands, and kidneys appear normal.
IMPRESSION: CT PELVIS
FINDINGS: 
IMPRESSION:

## 2010-02-14 IMAGING — CR DG CHEST 1V PORT
1 series · 1 of 1 positions shown · non-contrast
Comparison: Chest CT 09/21/2008 and portable chest 09/21/2008.

CLINICAL DATA: Dyspnea.  Pneumonia.

PORTABLE CHEST - 1 VIEW

[view not recorded]
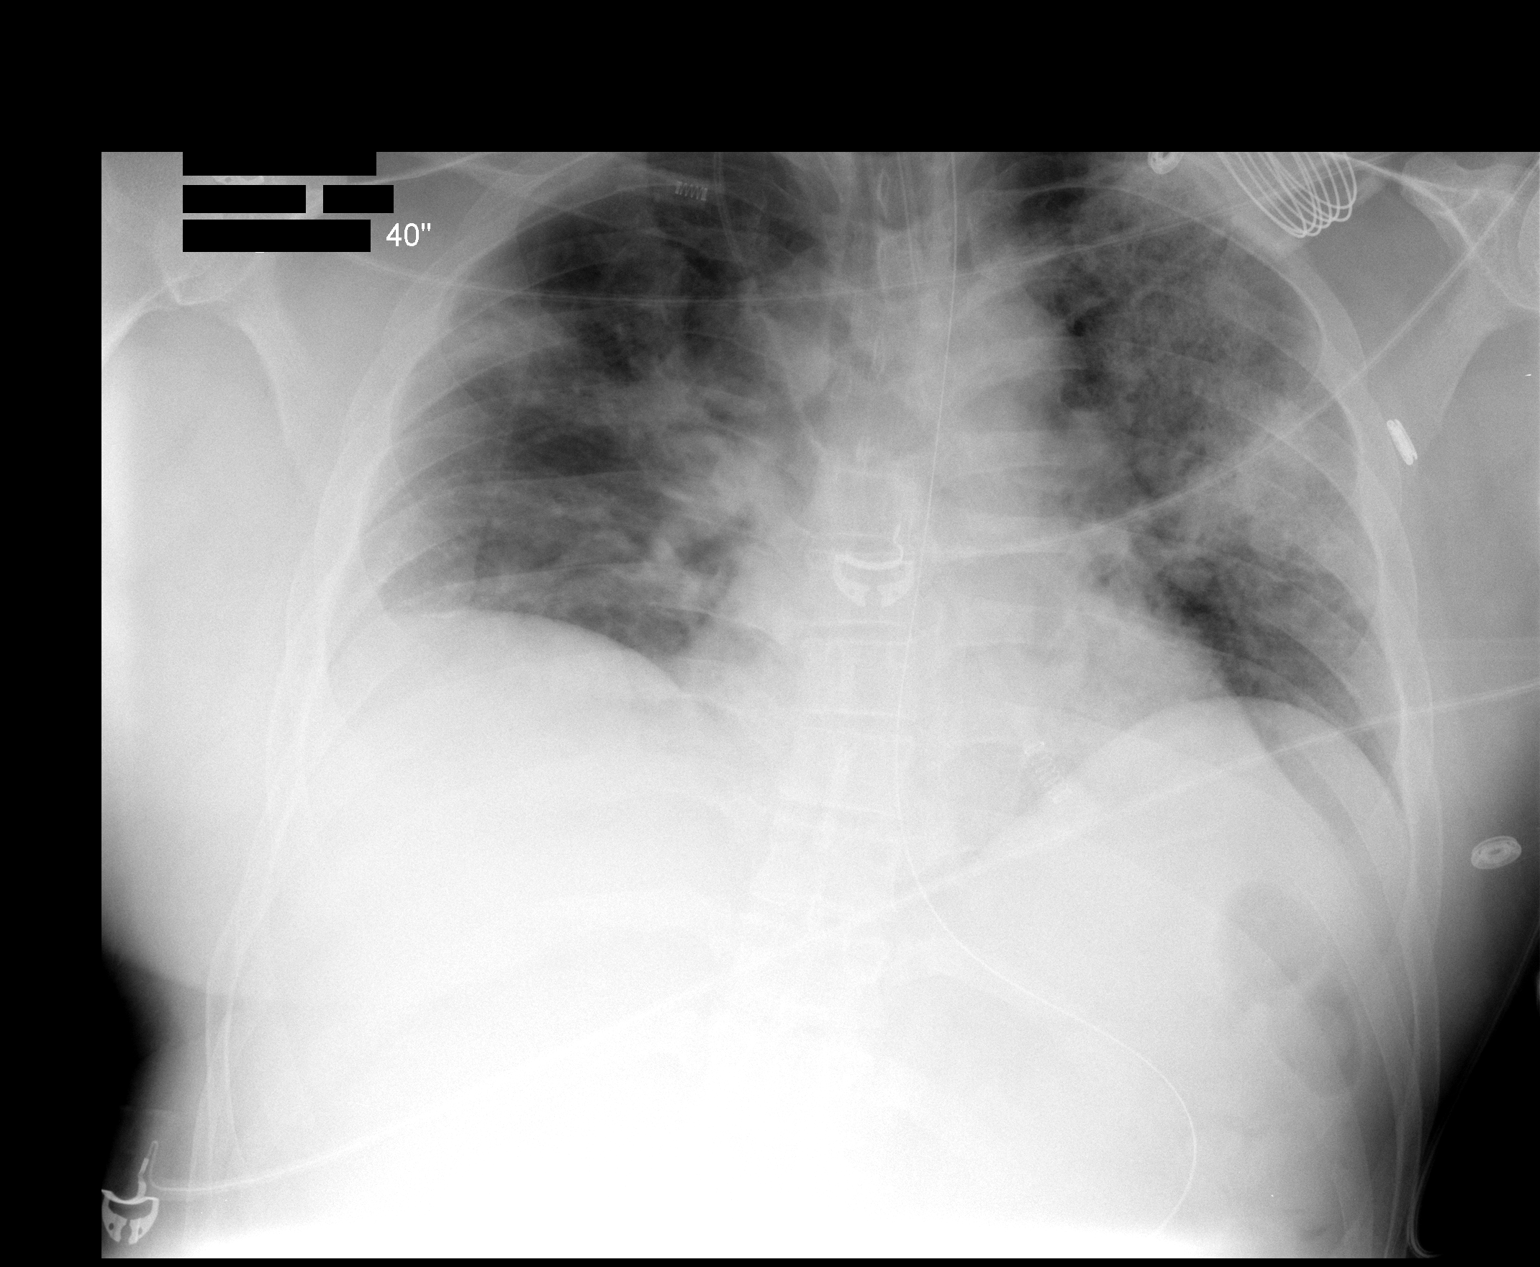

[1 of 1 positions shown; findings below may reference images not displayed]

FINDINGS: Support tubes and lines are unchanged.  There has been
improvement in aeration in the right mid lung.  Airspace disease in
the left chest persists.  Heart size normal.
IMPRESSION: Improved aeration in the right mid chest.  No other change.

## 2010-02-15 IMAGING — CR DG CHEST 1V PORT
2 series · 2 of 2 positions shown · non-contrast
Comparison: 09/22/2008.

CLINICAL DATA: Dyspnea.  Renal insufficiency.

PORTABLE CHEST - 1 VIEW

[view not recorded (1 of 2)]
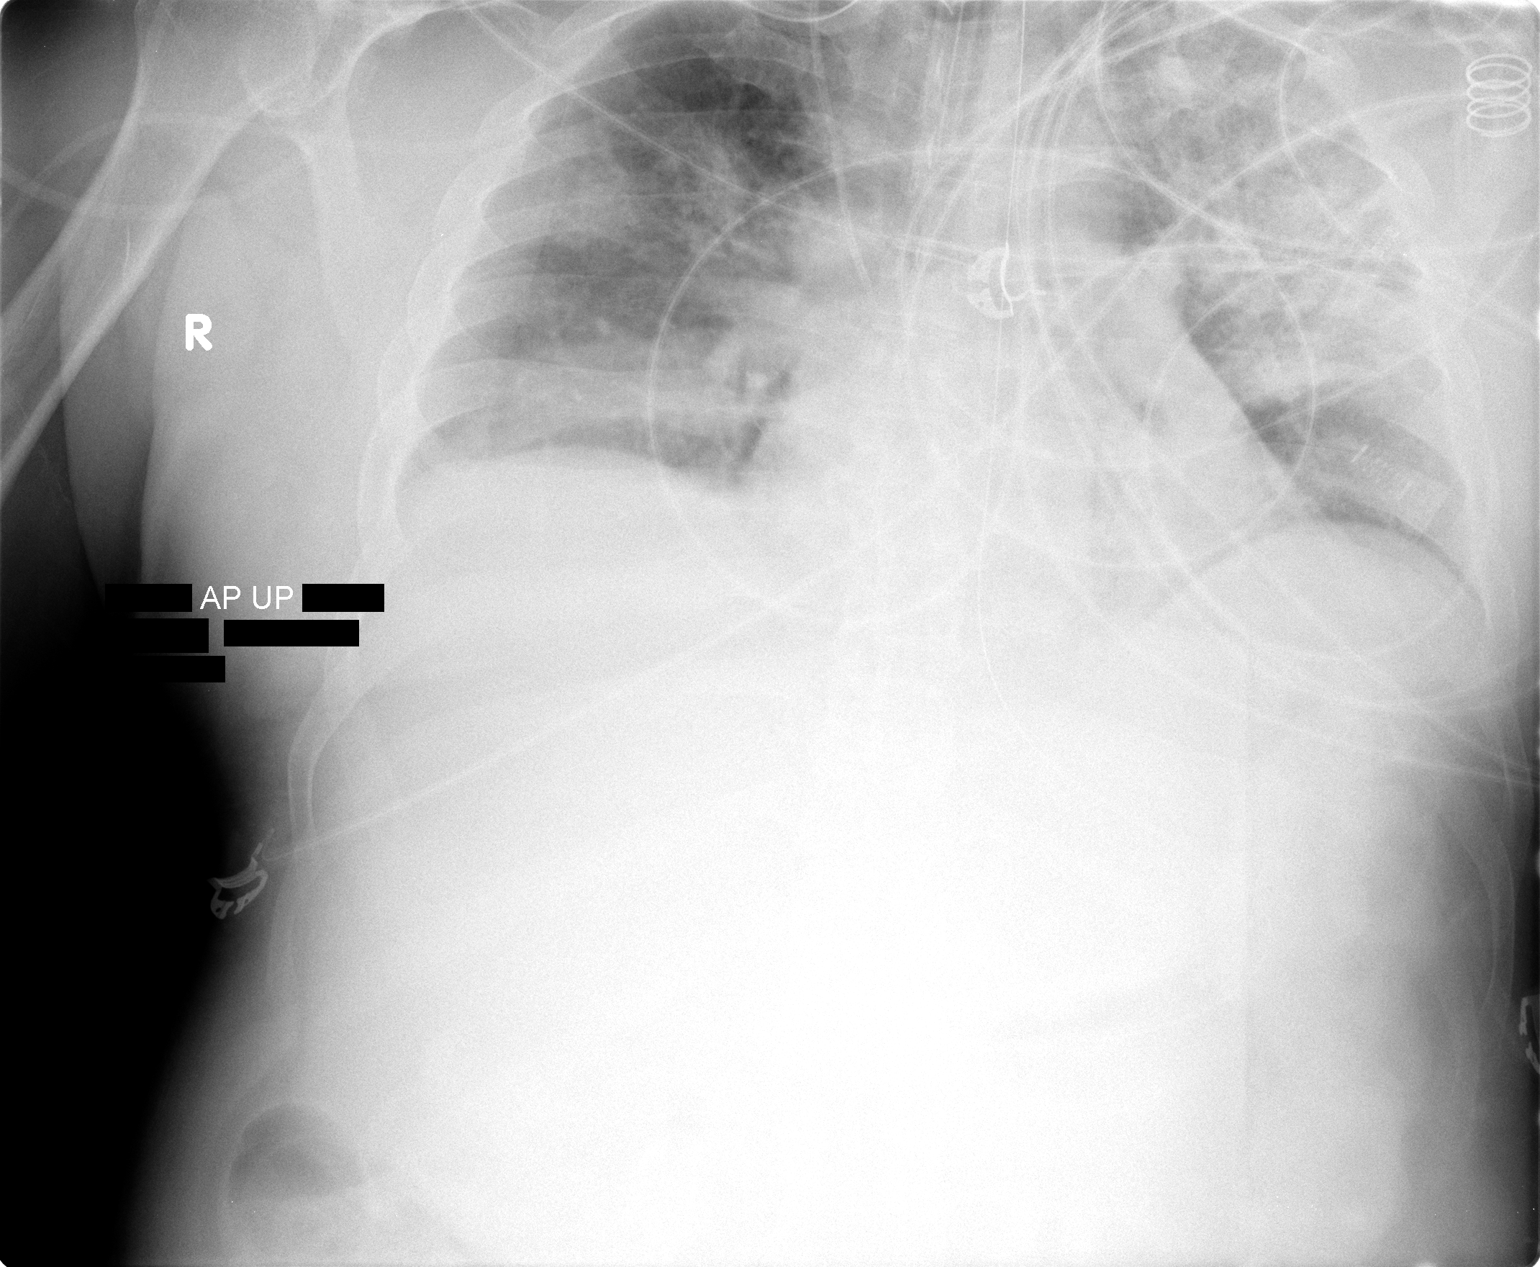

[view not recorded (2 of 2)]
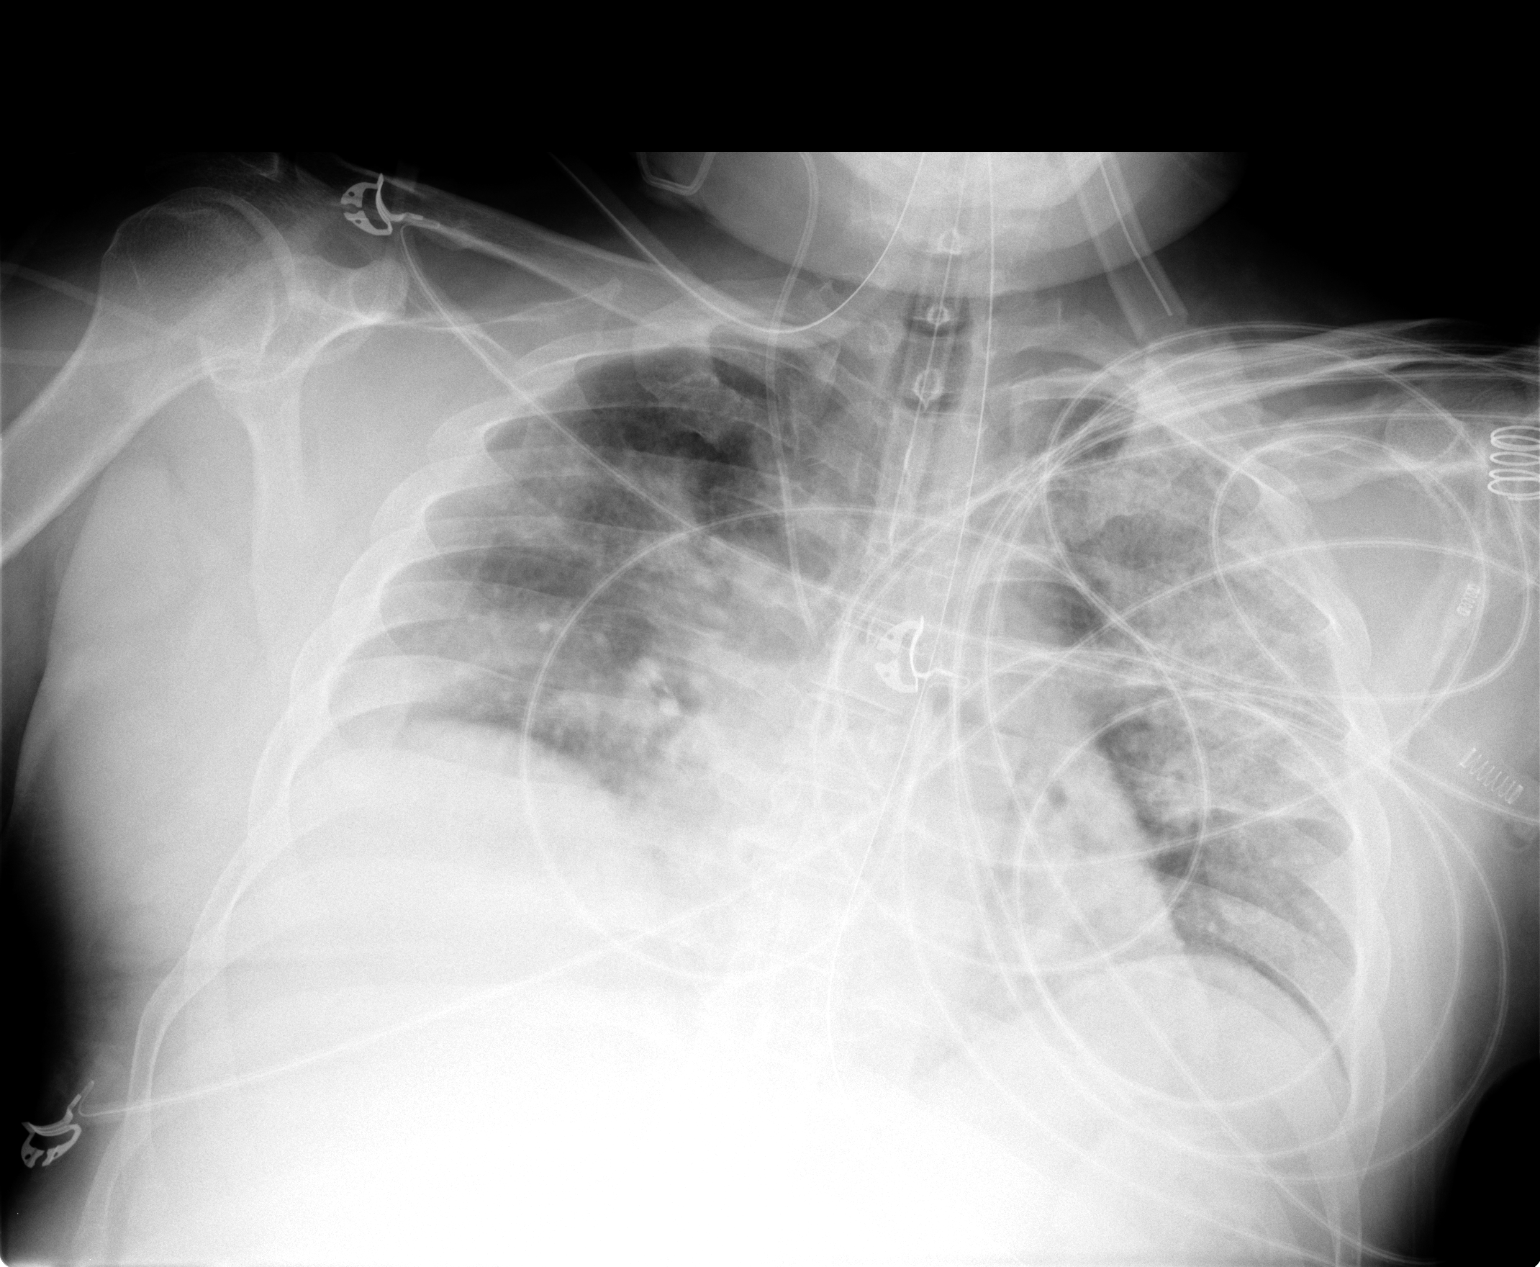

[2 of 2 positions shown; findings below may reference images not displayed]

FINDINGS: Endotracheal tube tip is 2.0 cm above the base of the
carina.  NG tube passes in the stomach although the distal tip is
not visualized.  Right IJ central venous catheter tip projects at
the proximal SVC level.

Lung volumes remain low.  There is persistent patchy bilateral
airspace disease.  The heart size is stable. Telemetry leads
overlie the chest.
IMPRESSION: No substantial interval change exam.

## 2010-02-16 IMAGING — CR DG CHEST 1V PORT
1 series · 1 of 1 positions shown · non-contrast
Comparison: 09/23/2008.

CLINICAL DATA: Dyspnea.  Respiratory distress.

PORTABLE CHEST - 1 VIEW

[view not recorded]
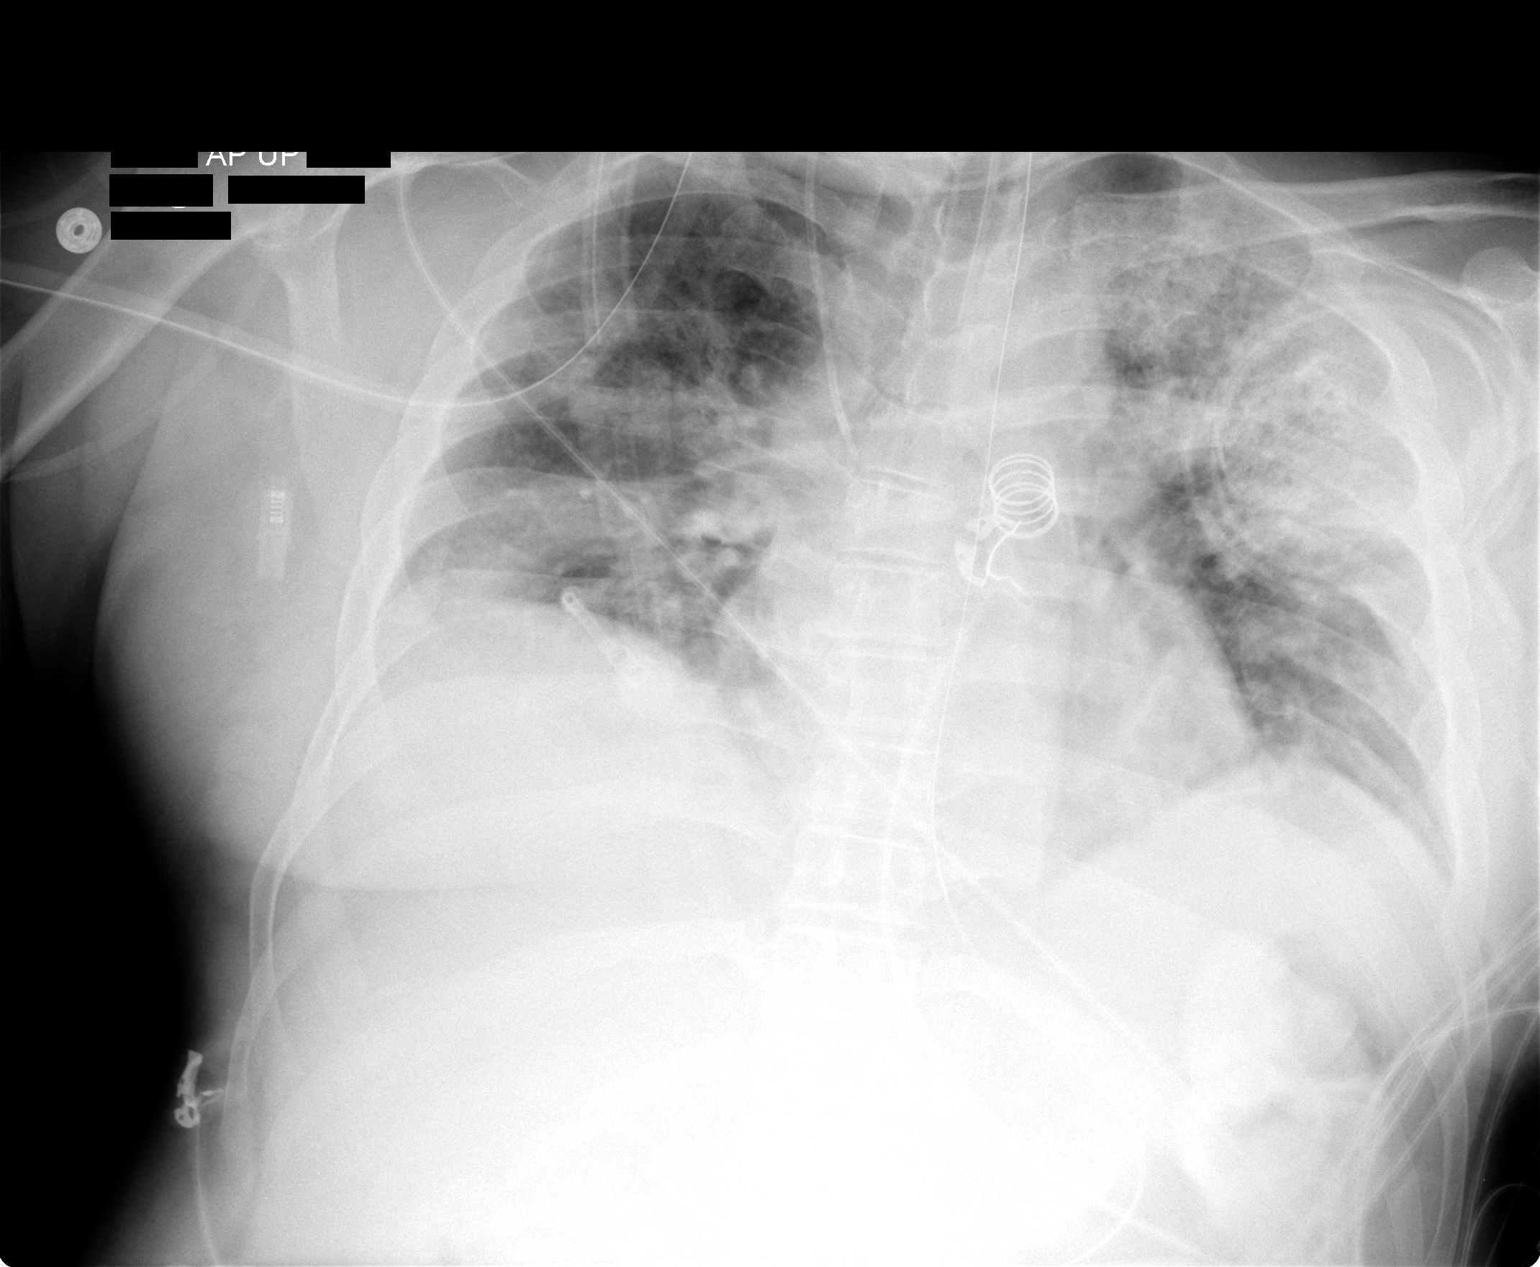

[1 of 1 positions shown; findings below may reference images not displayed]

FINDINGS: 5655 hours.  Endotracheal tube tip is about 3.8 cm above
the base of the carina.  NG tube passes into the stomach although
the distal tip is not visualized.  Right IJ central venous catheter
tip projects at the mid SVC level.

Lung volumes remain low.  Cardiopericardial silhouette is stable in
size.  The asymmetric airspace disease, left greater than right, is
unchanged.
IMPRESSION: Stable exam.

## 2010-02-17 IMAGING — CR DG CHEST 1V PORT
1 series · 1 of 1 positions shown · non-contrast
Comparison: Yesterday's exam

CLINICAL DATA: Dyspnea/PE/renal insufficiency.

PORTABLE CHEST - 1 VIEW

[view not recorded]
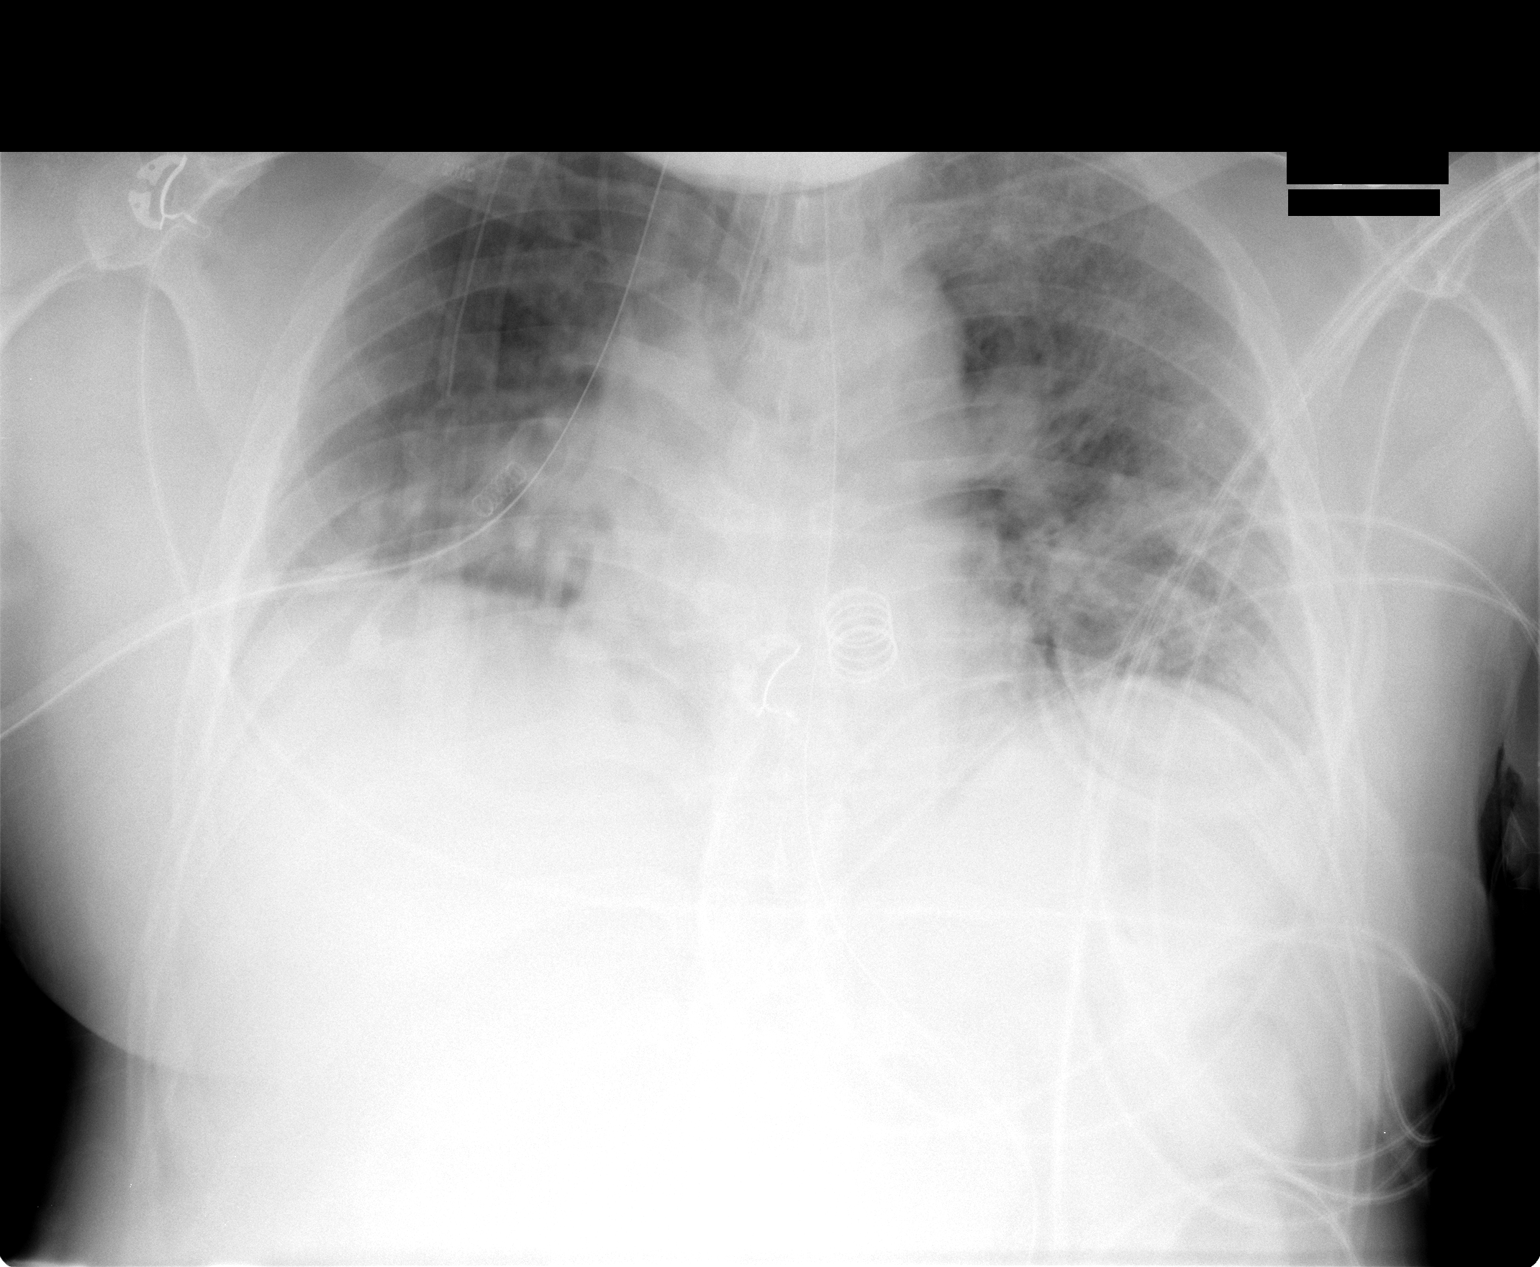

[1 of 1 positions shown; findings below may reference images not displayed]

FINDINGS: Satisfactory ET tube position.  NG tube is noted
traversing esophagus and proximal mid stomach.  CVC is in the SVC.
Bilateral low volume lungs.  Bilateral ill-defined parenchymal lung
densities.  Findings could be due to ARDS.
IMPRESSION: No significant change in bilateral parenchymal lung disease.  Query
ARDS.

## 2010-02-18 IMAGING — CR DG CHEST 1V PORT
1 series · 1 of 1 positions shown · non-contrast
Comparison: 09/25/2008

CLINICAL DATA: Dyspnea/pe/renal insufficiency

PORTABLE CHEST - 1 VIEW

[view not recorded]
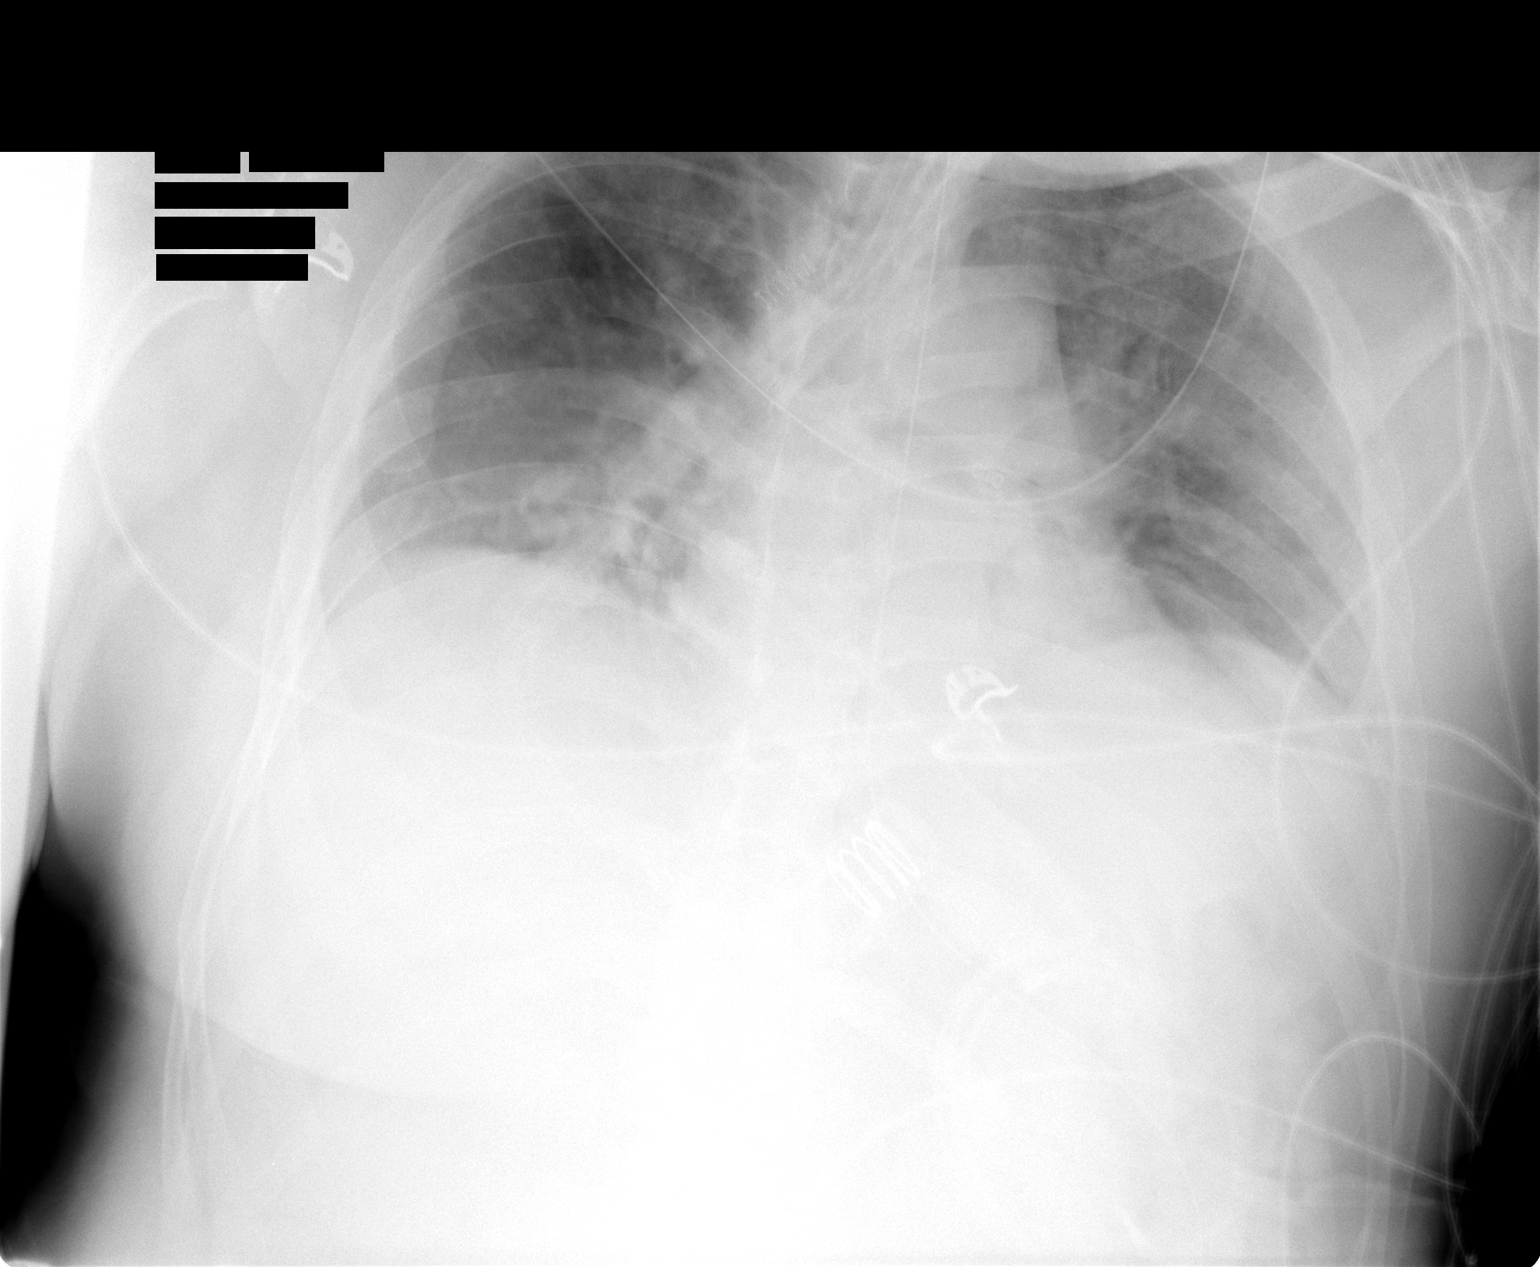

[1 of 1 positions shown; findings below may reference images not displayed]

FINDINGS: Low volume lungs.  Overall aeration appears improved.
There may be mild atelectatic changes in the left upper lobe and at
the right base.  Central venous catheter is in the SVC.
Satisfactory ET tube position.  NG tube is noted traversing the
esophagus and proximal and mid stomach.
IMPRESSION: Slight improved aeration of the lungs.  Mild atelectatic changes
are likely present.

## 2010-02-20 IMAGING — CR DG CHEST 1V PORT
1 series · 1 of 1 positions shown · non-contrast
Comparison: 09/27/2008

CLINICAL DATA: Respiratory distress

PORTABLE CHEST - 1 VIEW

[view not recorded]
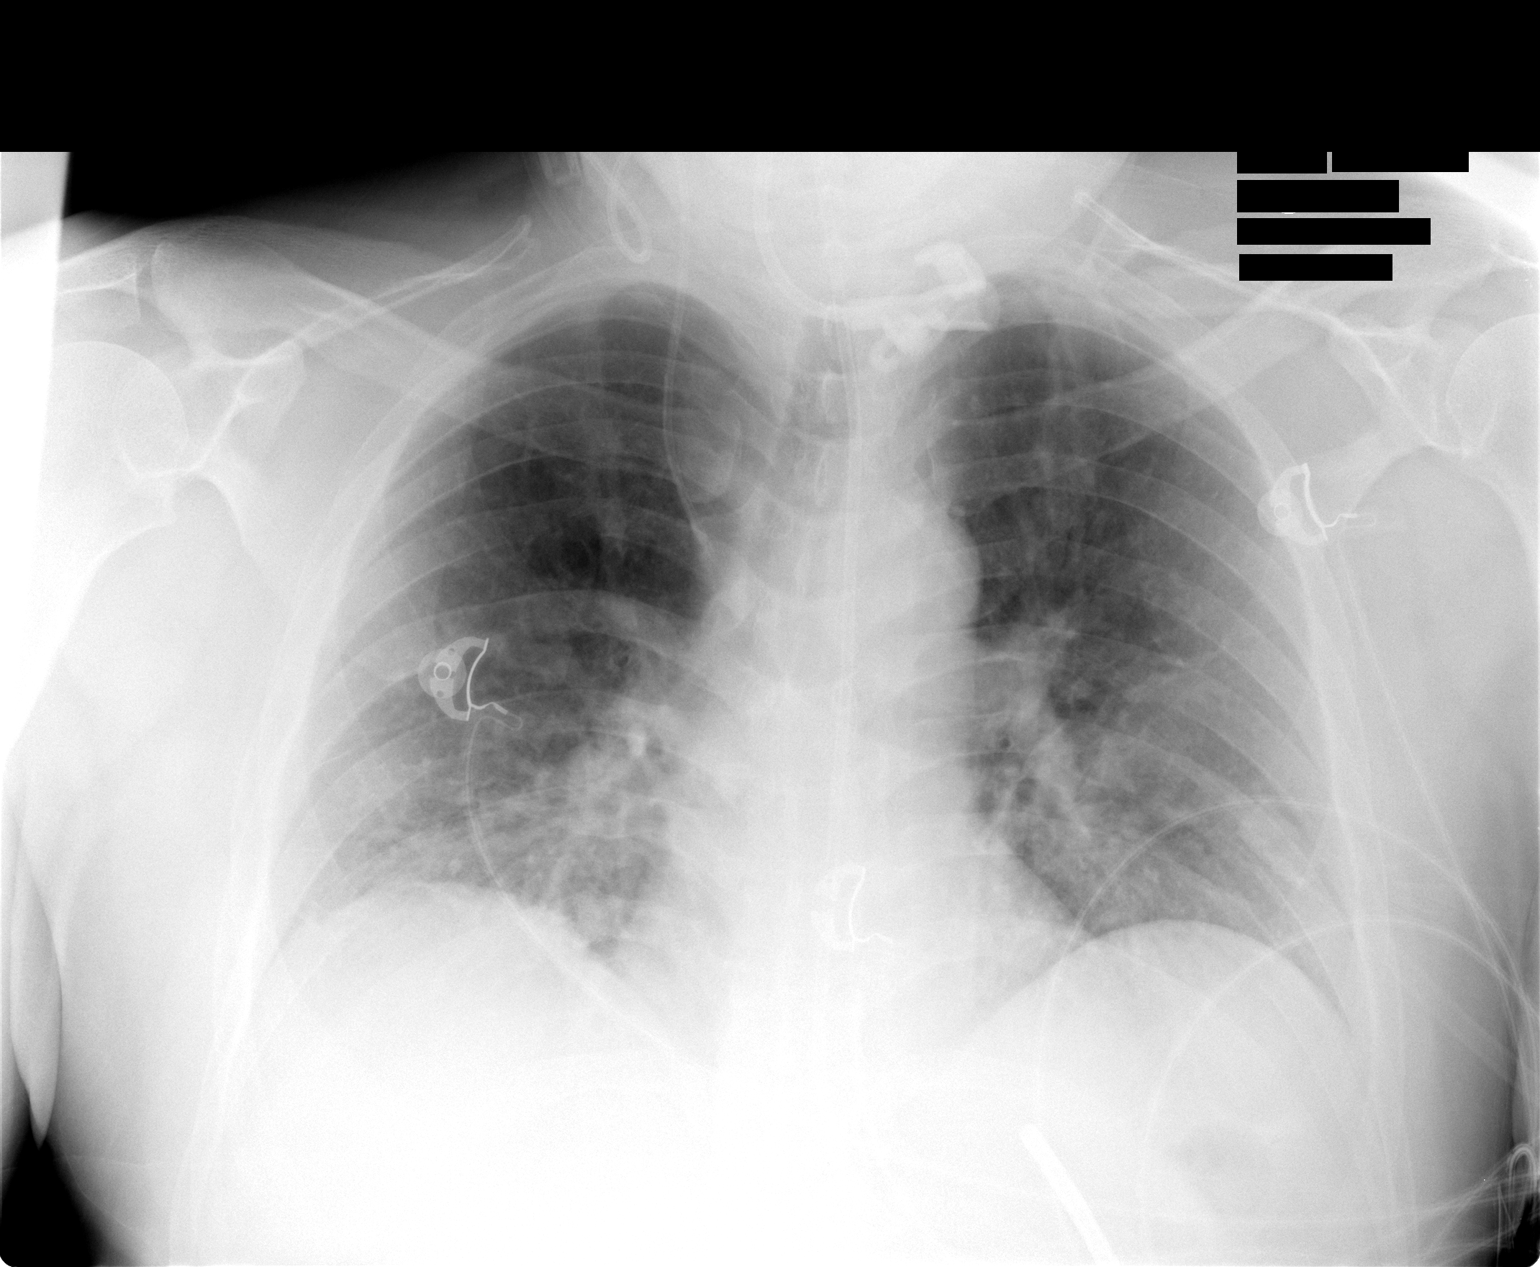

[1 of 1 positions shown; findings below may reference images not displayed]

FINDINGS: ET tube has been removed.  The level of inspiration has
improved as well.  Heart and mediastinal contours remain normal.
Bibasilar densities persist with some improvement.  No new
findings.  Central line unchanged.  No pneumothorax.
IMPRESSION: 1.  ET tube removed.
2.  Interval improvement of aeration of the lung bases.
3.  No new findings.

## 2010-02-21 IMAGING — CR DG CHEST 2V
2 series · 2 of 2 positions shown · non-contrast
Comparison: 09/28/2008

CLINICAL DATA: Dyspnea, PE, renal insufficiency

CHEST - 2 VIEW

[w chest lat *]
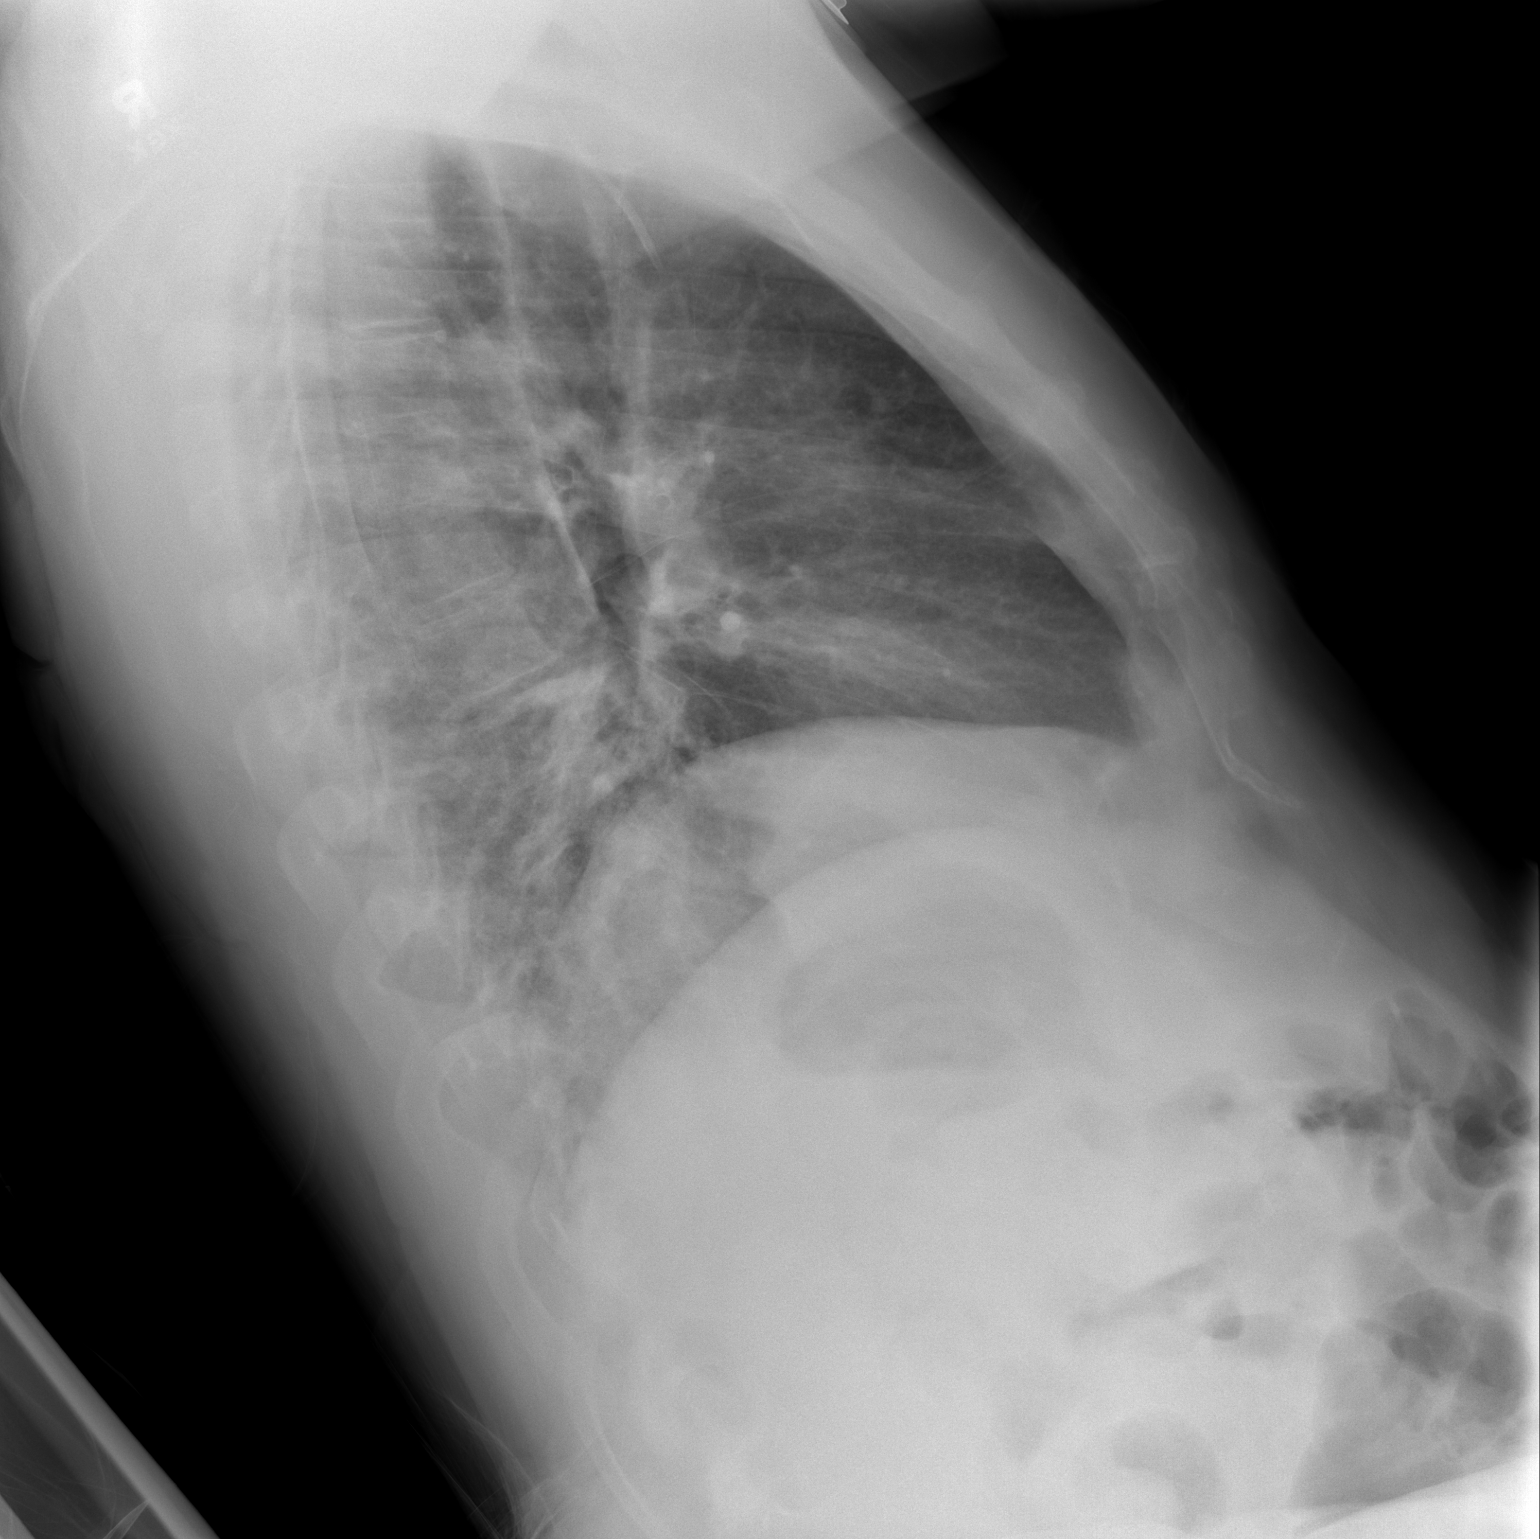

[view not recorded]
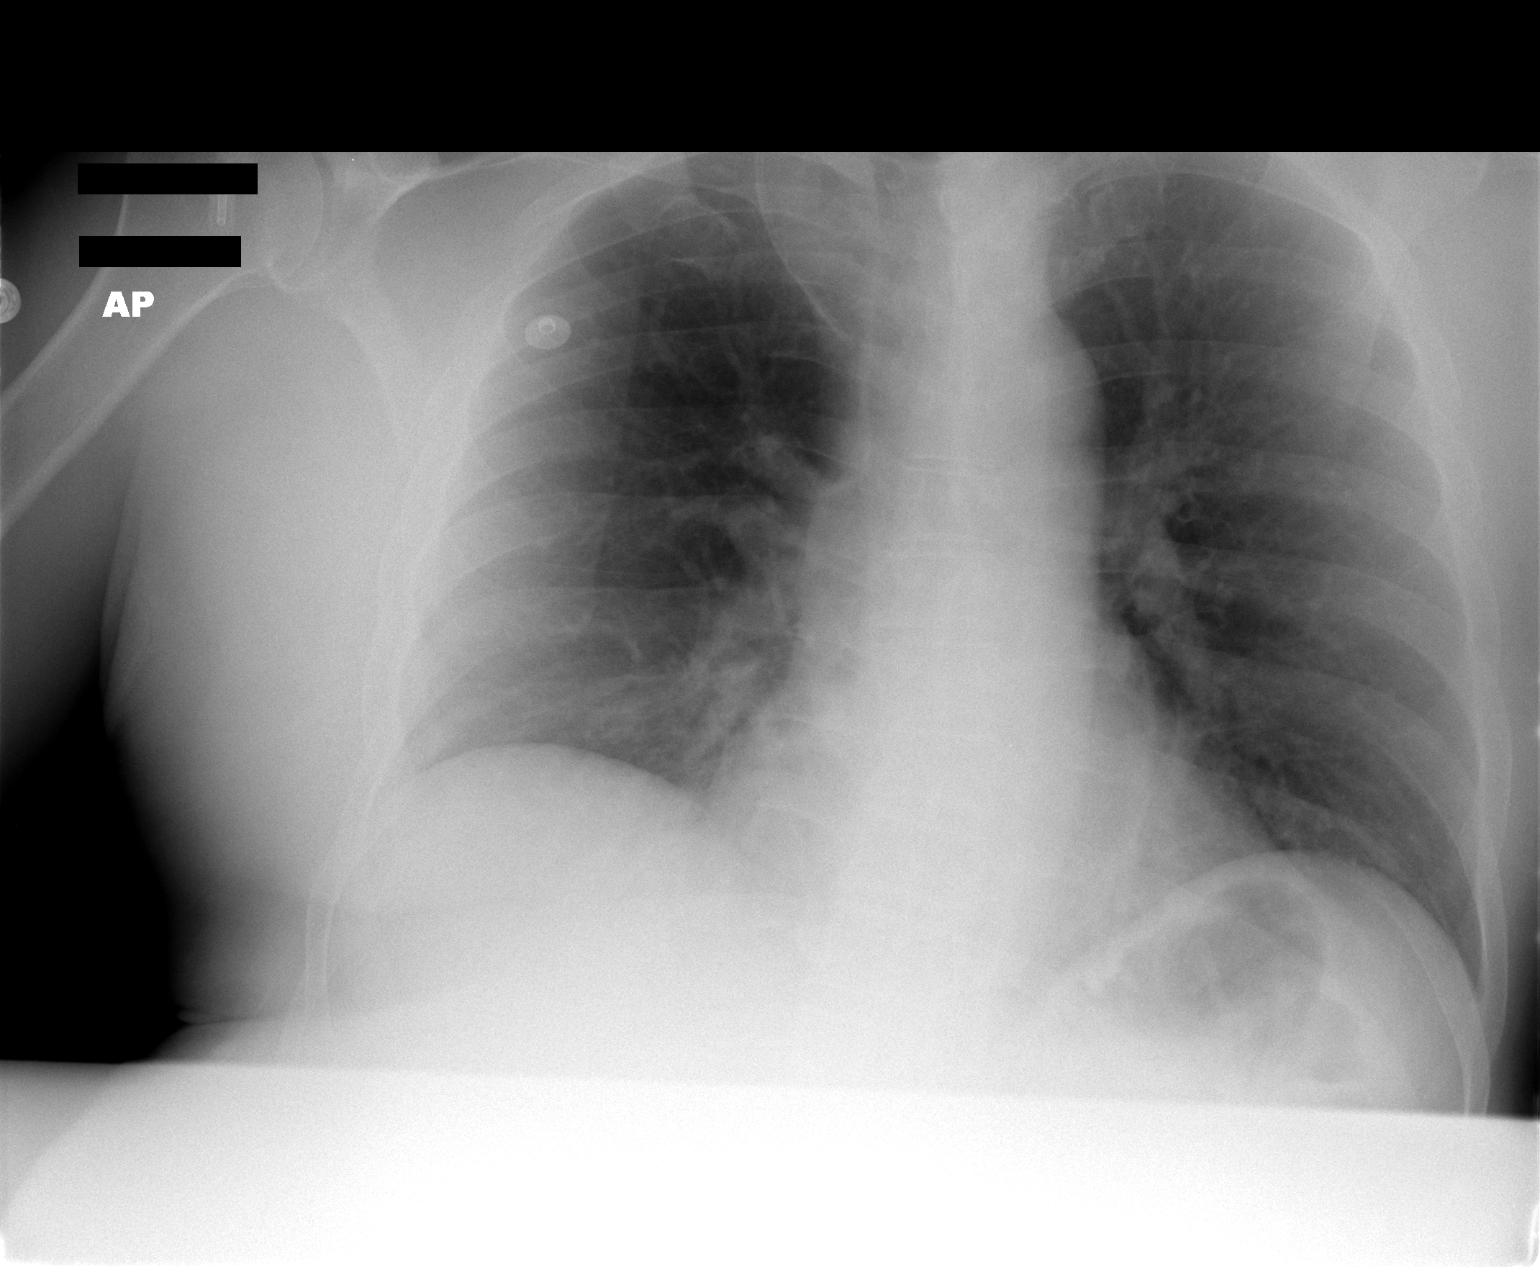

[2 of 2 positions shown; findings below may reference images not displayed]

FINDINGS: Cardiomediastinal silhouette is stable.  There is
improvement in aeration.  No acute infiltrate or edema.  No change
in the right IJ central venous line position.  Mild elevation of
the right hemidiaphragm. NG tube has been removed.
IMPRESSION: No acute infiltrate or edema.  Improved aeration.  No change in
right IJ central venous line position.

## 2010-02-28 ENCOUNTER — Emergency Department (HOSPITAL_COMMUNITY): Admission: EM | Admit: 2010-02-28 | Discharge: 2010-02-28 | Payer: Self-pay | Admitting: Emergency Medicine

## 2010-03-20 ENCOUNTER — Ambulatory Visit: Payer: Self-pay | Admitting: Hematology and Oncology

## 2010-03-21 LAB — CBC WITH DIFFERENTIAL/PLATELET
BASO%: 0.4 % (ref 0.0–2.0)
Basophils Absolute: 0 10*3/uL (ref 0.0–0.1)
EOS%: 2.2 % (ref 0.0–7.0)
HGB: 15.6 g/dL (ref 13.0–17.1)
MCH: 27.6 pg (ref 27.2–33.4)
RDW: 16.2 % — ABNORMAL HIGH (ref 11.0–14.6)
lymph#: 1.5 10*3/uL (ref 0.9–3.3)

## 2010-03-21 LAB — COMPREHENSIVE METABOLIC PANEL
ALT: 45 U/L (ref 0–53)
AST: 30 U/L (ref 0–37)
Albumin: 4.3 g/dL (ref 3.5–5.2)
Calcium: 9.9 mg/dL (ref 8.4–10.5)
Chloride: 103 mEq/L (ref 96–112)
Potassium: 3.5 mEq/L (ref 3.5–5.3)
Total Protein: 7.8 g/dL (ref 6.0–8.3)

## 2010-05-03 ENCOUNTER — Encounter: Admission: RE | Admit: 2010-05-03 | Discharge: 2010-05-03 | Payer: Self-pay | Admitting: Neurology

## 2010-06-18 IMAGING — MR MR HEAD WO/W CM
9 of 11 series · 31 of 48 positions shown · non-contrast
Comparison: none

[Series 2: T1 · sagittal · 5.0mm · 0.47mm/px · 2 of 20 slices shown]
[im 1/20]
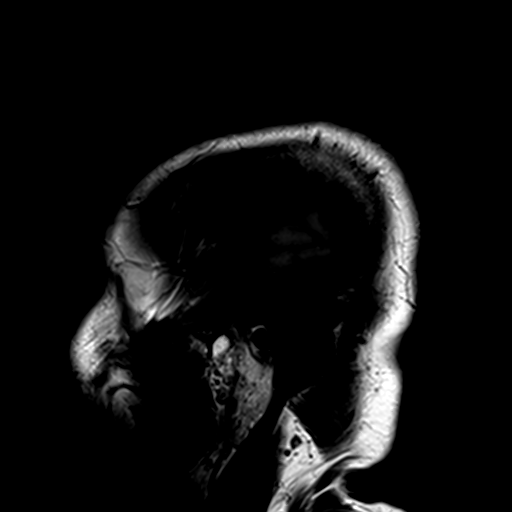
[im 20/20]
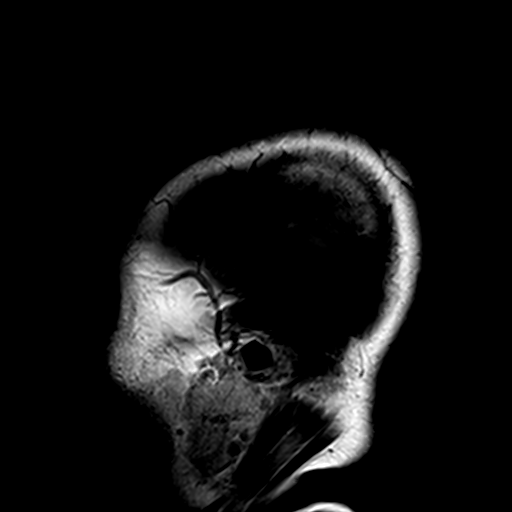

[Series 3: DWI · axial · 5.0mm · 1.80mm/px · z∈[-49,+81]mm · 5 of 42 slices shown (1 of 2)]
[im 1/42]
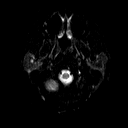
[im 11/42]
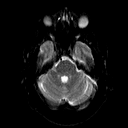
[im 21/42]
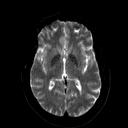
[im 31/42]
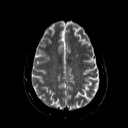
[im 42/42]
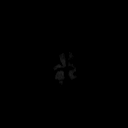

[Series 4: DWI · axial · 5.0mm · 1.80mm/px · z∈[-49,+81]mm · 3 of 21 slices shown (2 of 2)]
[im 1/21]
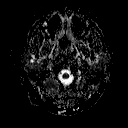
[im 11/21]
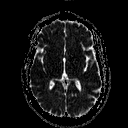
[im 21/21]
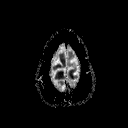

[Series 5: T2 · axial · 5.0mm · 0.45mm/px · z∈[-53,+77]mm · 3 of 21 slices shown (1 of 2)]
[im 1/21]
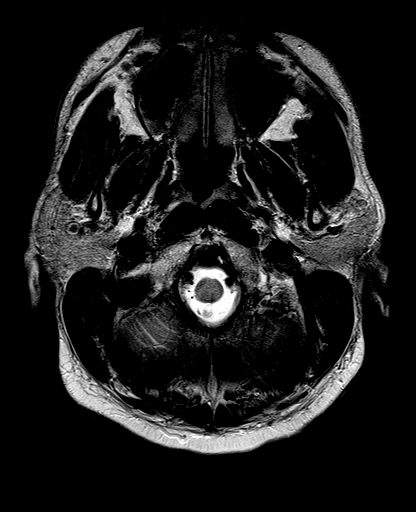
[im 11/21]
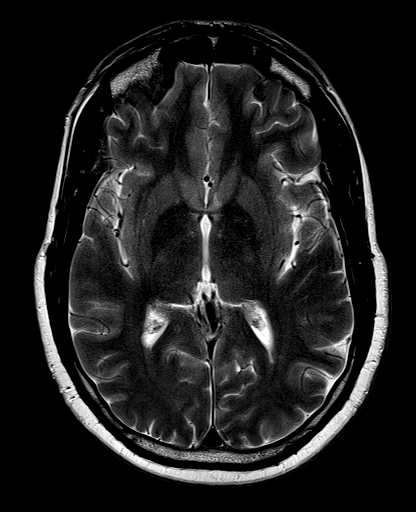
[im 21/21]
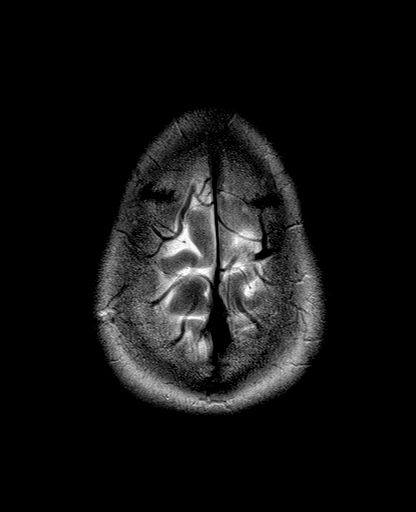

[Series 6: FLAIR · axial · 5.0mm · 0.45mm/px · z∈[-53,+77]mm · 3 of 21 slices shown (1 of 2)]
[im 1/21]
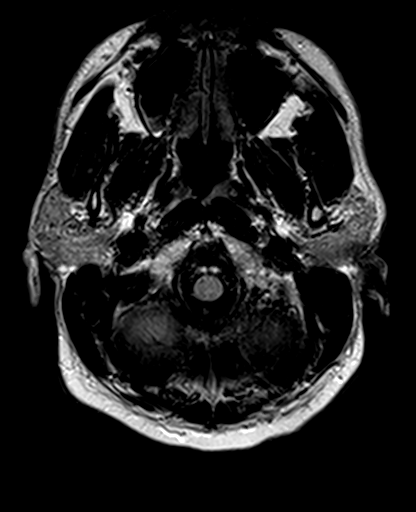
[im 11/21]
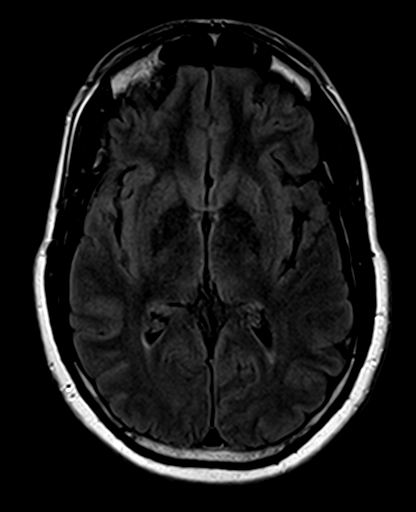
[im 21/21]
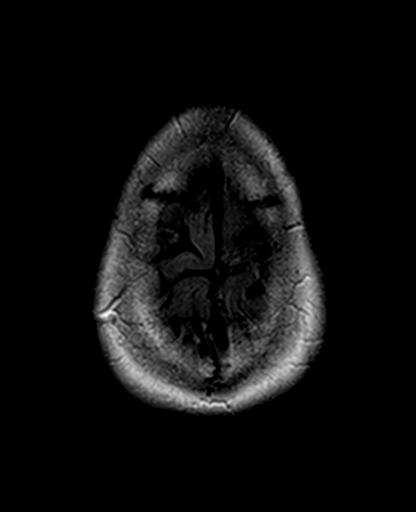

[Series 7: mpr tra · axial · 2.0mm · 0.45mm/px · z∈[-67,+91]mm · 8 of 80 slices shown]
[im 1/80]
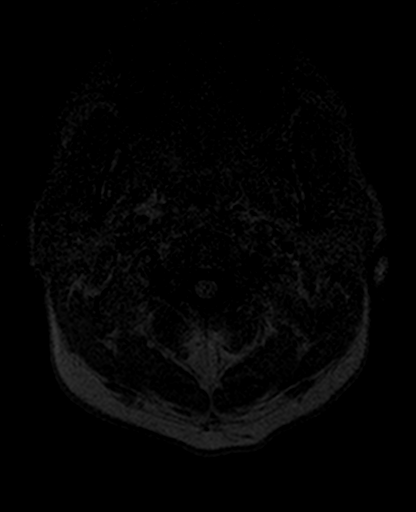
[im 9/80]
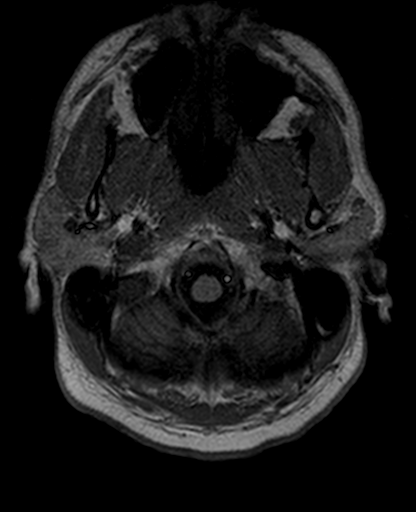
[im 27/80]
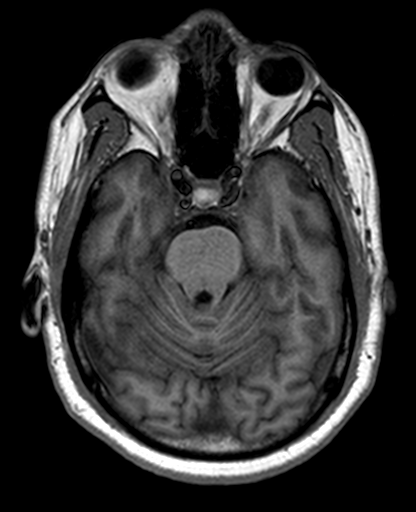
[im 36/80]
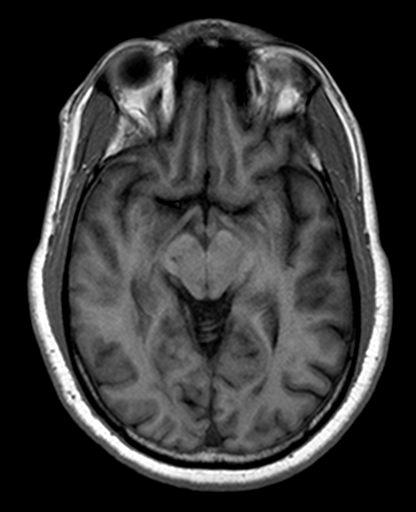
[im 44/80]
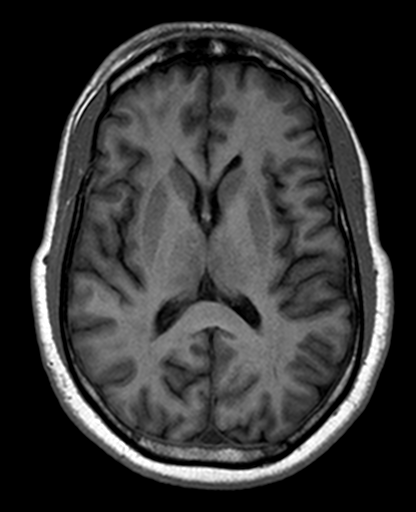
[im 53/80]
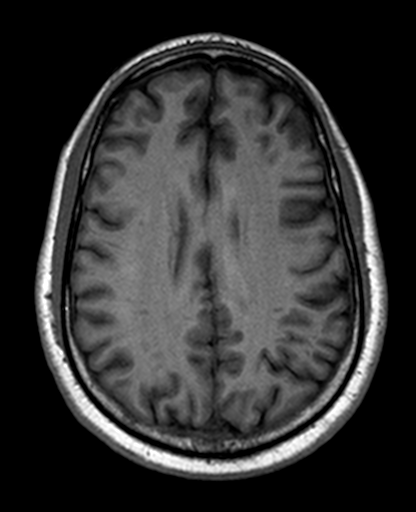
[im 71/80]
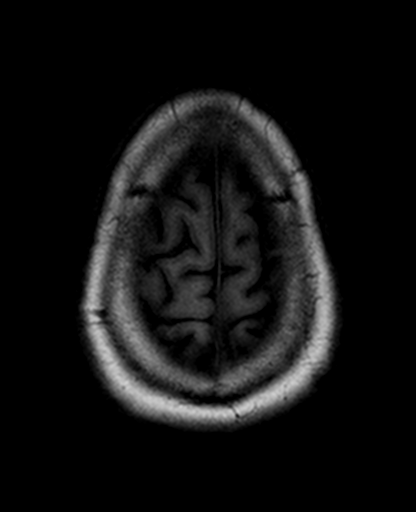
[im 80/80]
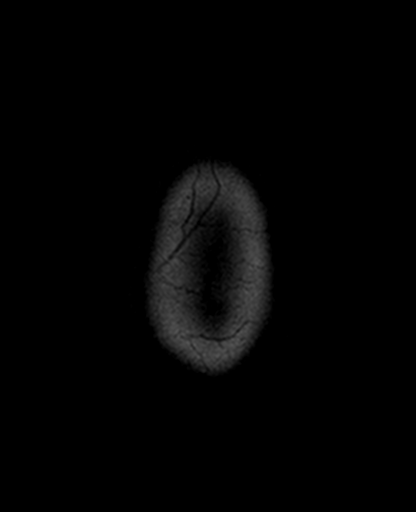

[Series 8: T2 · coronal · 5.0mm · 0.45mm/px · 3 of 22 slices shown (2 of 2)]
[im 1/22]
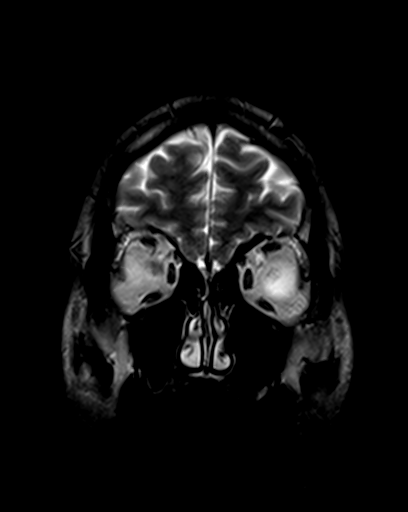
[im 11/22]
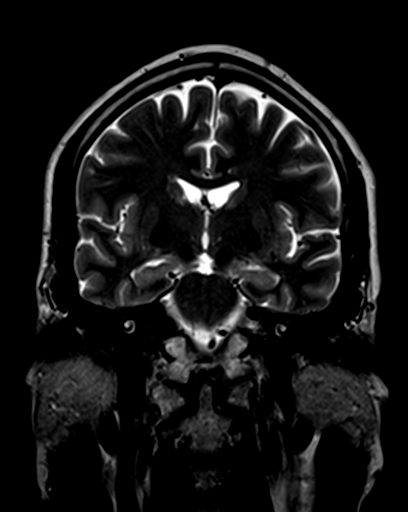
[im 22/22]
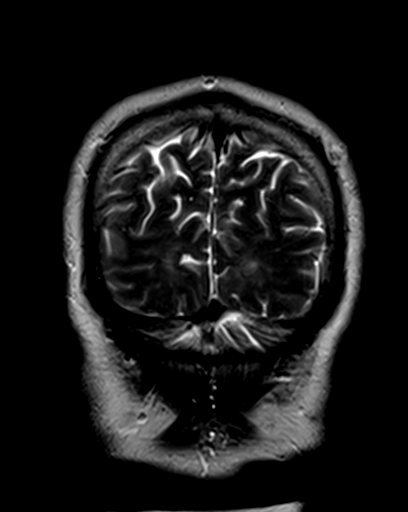

[Series 9: axial grad (blood) · axial · 5.0mm · 0.47mm/px · 1 of 21 slices shown]
[im 1/21]
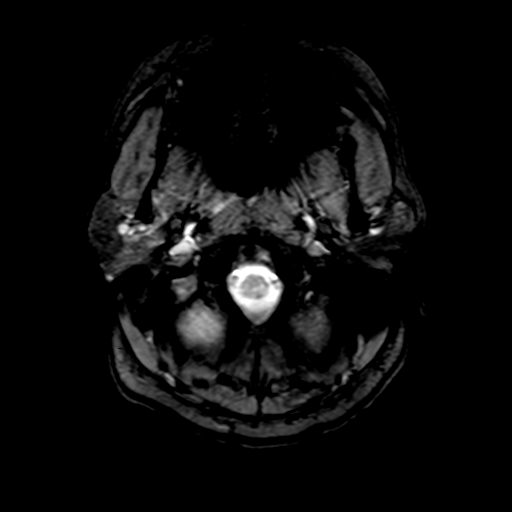

[Series 10: FLAIR · sagittal · 5.0mm · 0.45mm/px · 3 of 20 slices shown (2 of 2)]
[im 1/20]
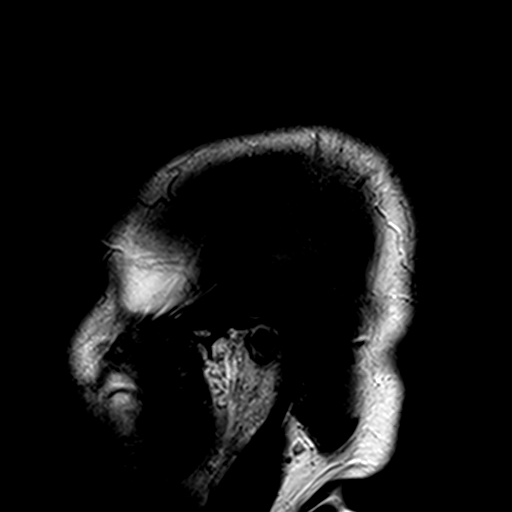
[im 10/20]
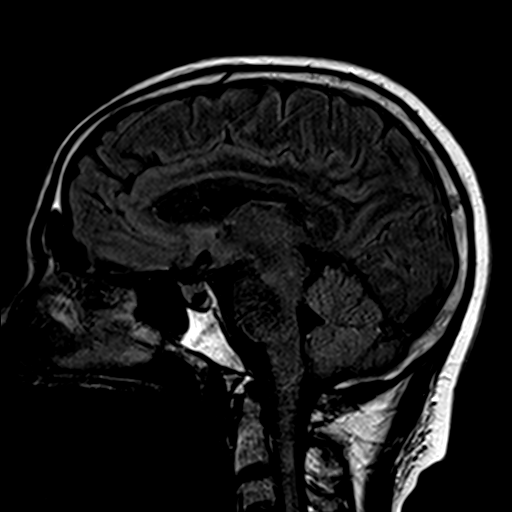
[im 20/20]
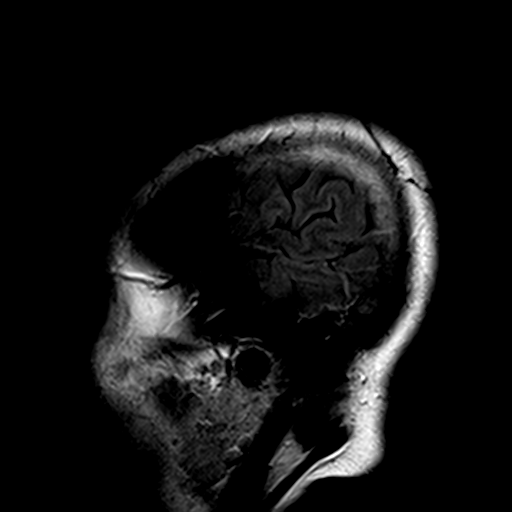

[31 of 48 positions shown; findings below may reference images not displayed]

This examination was performed at [HOSPITAL] at [[facility]OSPITAL]. The interpretation will be provided by [REDACTED].

## 2010-06-19 IMAGING — MR MR CERVICAL SPINE W/O CM
7 series · 25 of 48 positions shown · non-contrast
Comparison: none

[Series 3: T2 · sagittal · 3.0mm · 0.33mm/px · 2 of 11 slices shown (1 of 2)]
[im 1/11]
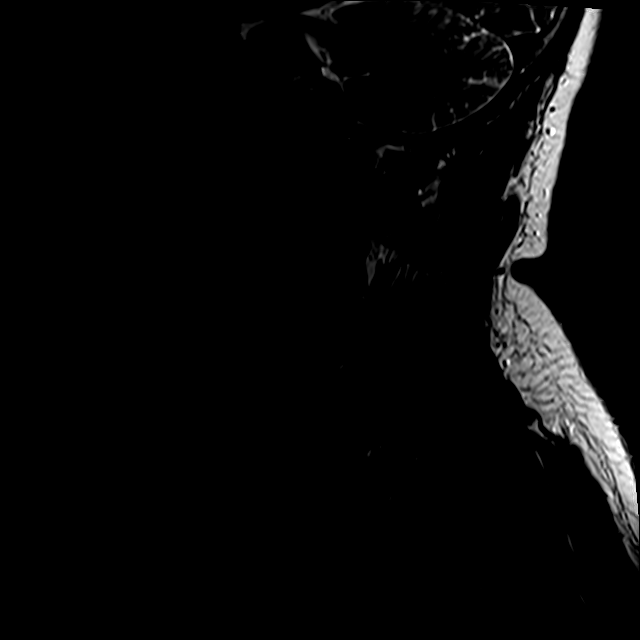
[im 11/11]
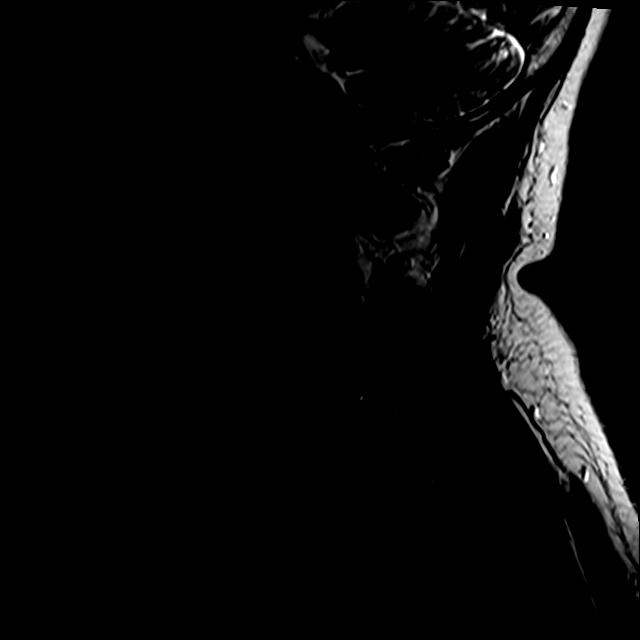

[Series 4: T1 · sagittal · 3.0mm · 0.66mm/px · 2 of 11 slices shown]
[im 1/11]
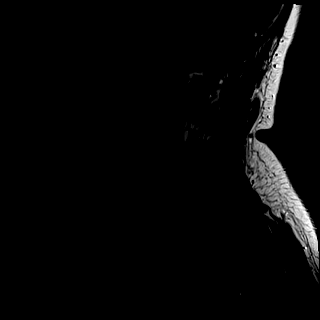
[im 11/11]
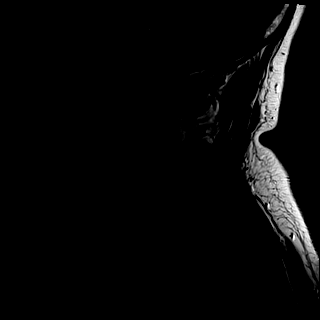

[Series 5: PD · sagittal · 3.0mm · 0.66mm/px · 3 of 11 slices shown]
[im 1/11]
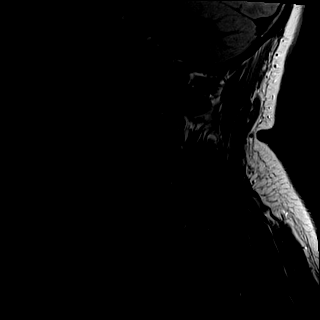
[im 6/11]
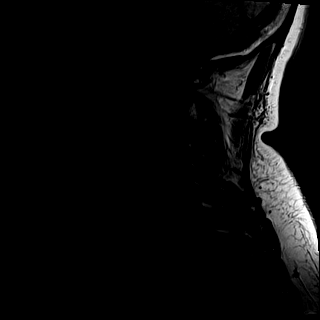
[im 11/11]
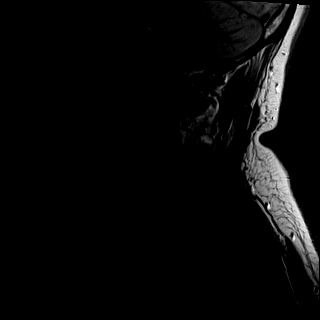

[Series 6: tir sag · sagittal · 3.0mm · 0.43mm/px · 3 of 11 slices shown]
[im 1/11]
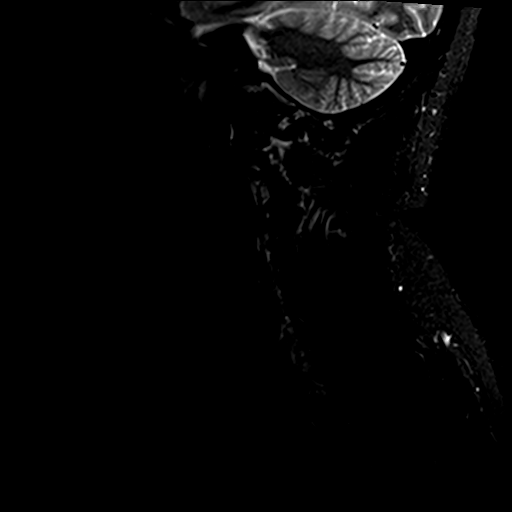
[im 6/11]
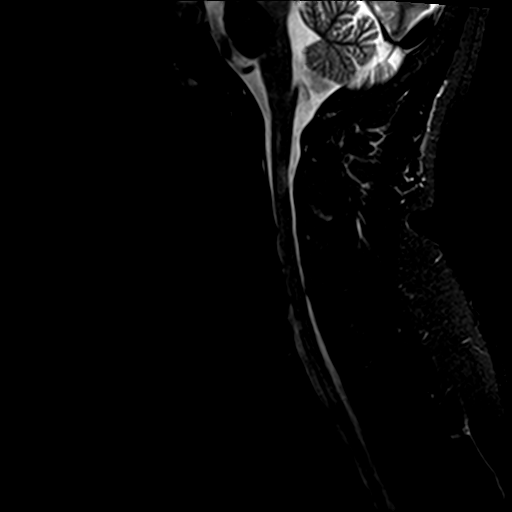
[im 11/11]
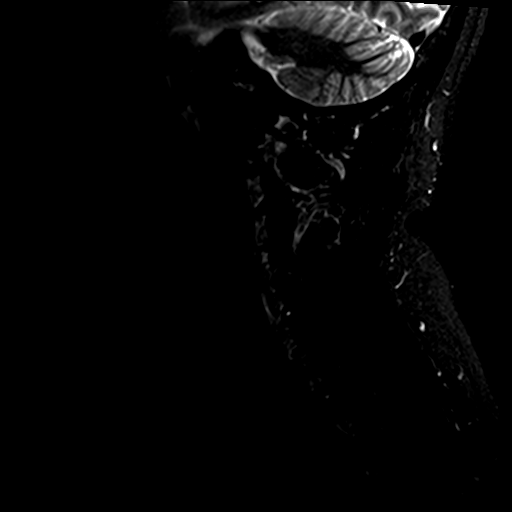

[Series 7: GRE · axial · 4.0mm · 0.41mm/px · z∈[-90,+13]mm · 6 of 24 slices shown]
[im 1/24]
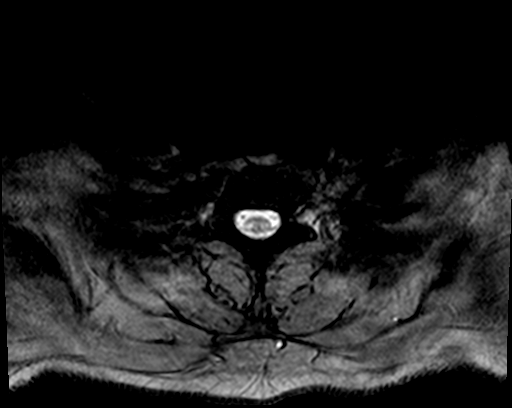
[im 5/24]
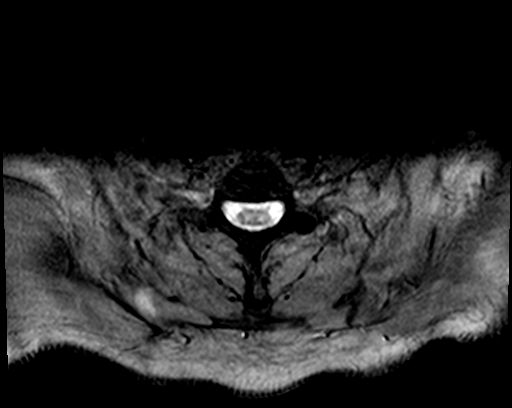
[im 10/24]
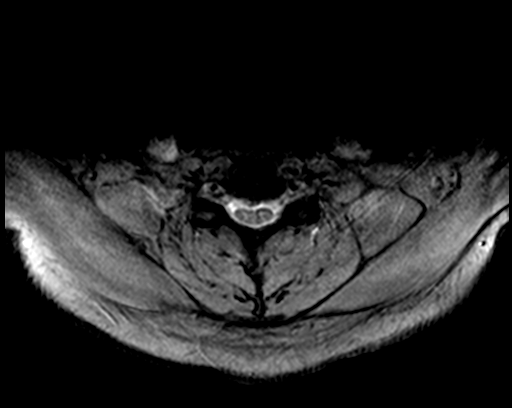
[im 14/24]
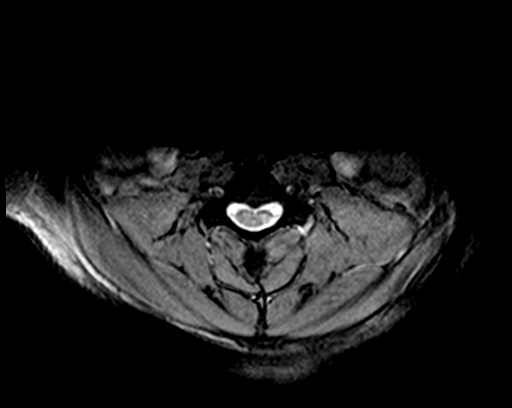
[im 19/24]
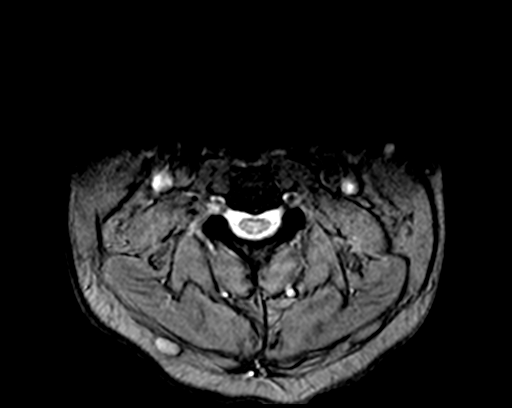
[im 24/24]
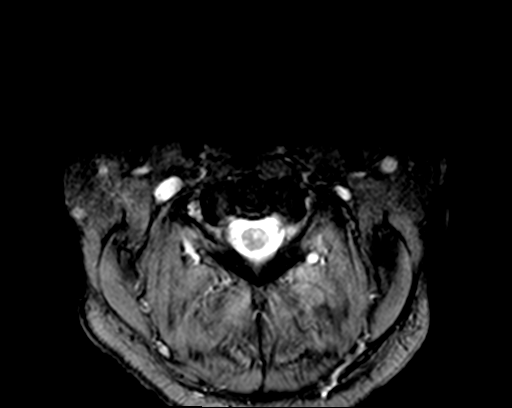

[Series 8: T2 · axial · 4.0mm · 0.66mm/px · z∈[-95,+8]mm · 6 of 23 slices shown (2 of 2)]
[im 1/23]
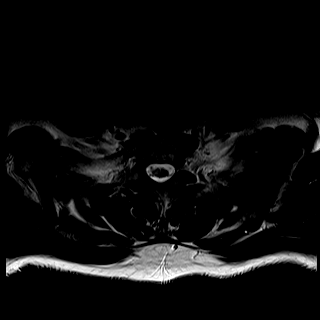
[im 5/23]
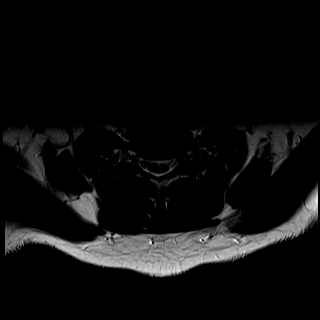
[im 9/23]
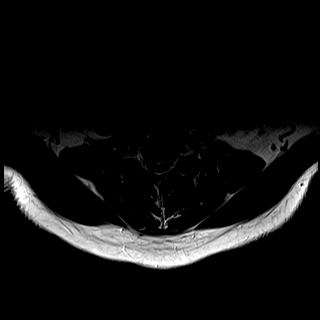
[im 14/23]
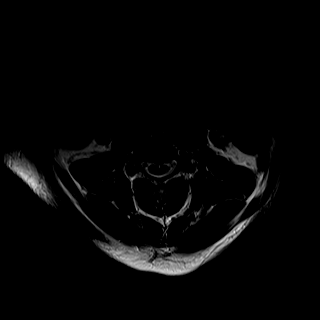
[im 18/23]
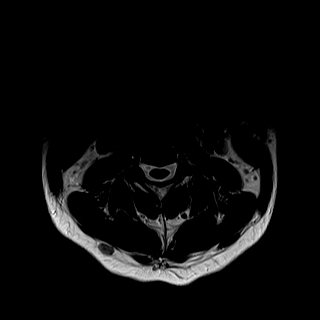
[im 23/23]
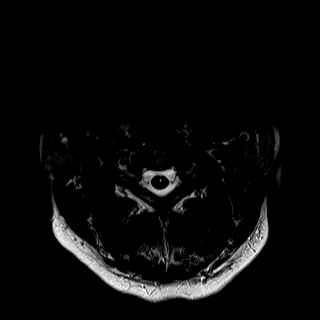

[Series 9: bSSFP · axial · 1.1mm · 0.33mm/px · z∈[-92,-62]mm · 3 of 104 slices shown]
[im 5/104]
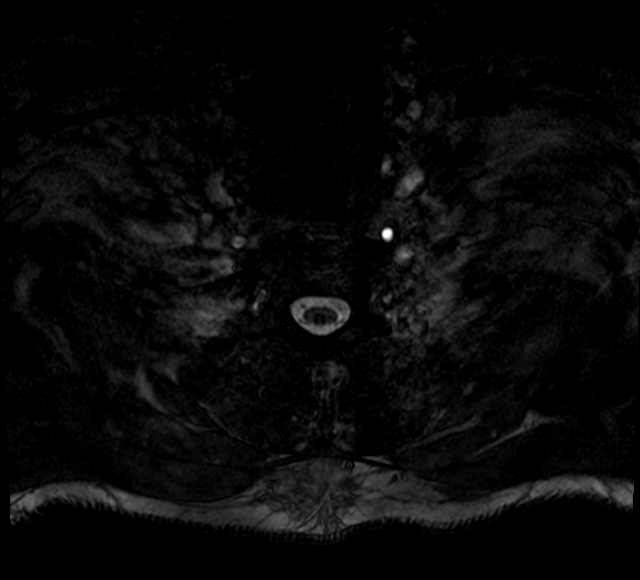
[im 17/104]
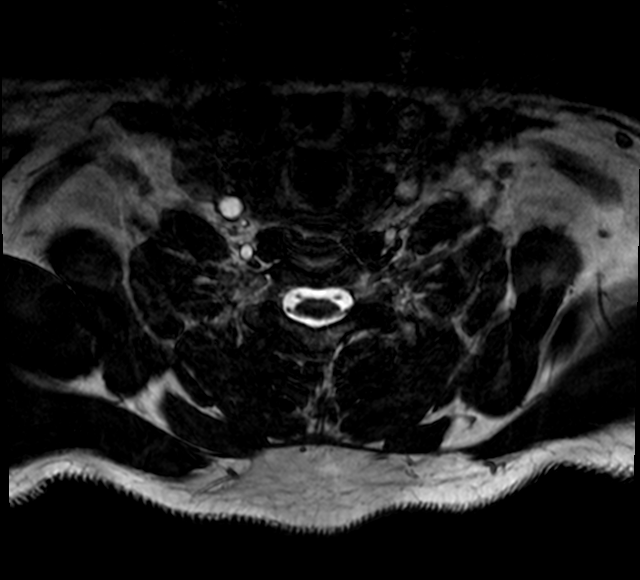
[im 33/104]
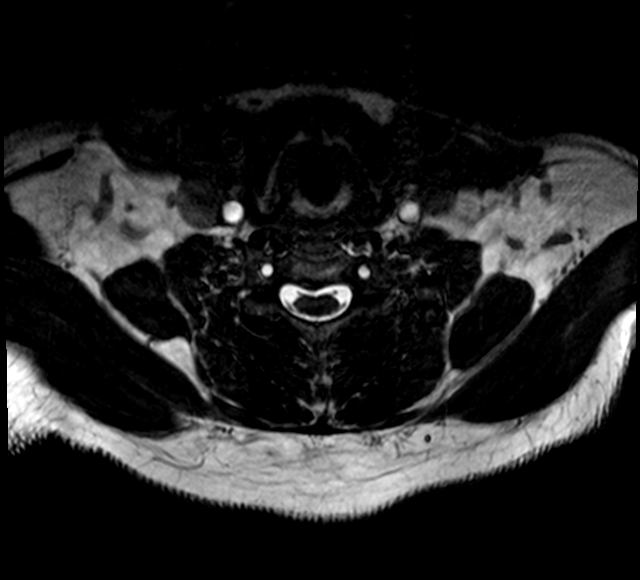

[25 of 48 positions shown; findings below may reference images not displayed]

This examination was performed at [HOSPITAL] at [[facility]OSP[facility]]. The interpretation will be provided by [REDACTED].

## 2010-06-27 IMAGING — XA IR FLUORO GUIDE CV LINE*R*
1 series · 1 of 1 positions shown · non-contrast
Comparison: none

CLINICAL DATA: Multiple sclerosis. Needs IV access for intravenous
medications.

UPPER EXTREMITY PICC PLACEMENT WITH ULTRASOUND AND FLUORO GUIDANCE
TECHNIQUE: The right arm was prepped with chlorhexidine, draped in
the usual sterile fashion using maximum barrier technique and
infiltrated locally with 1% Lidocaine.  Ultrasound demonstrated
patency of the right basilic vein.  Under real-time ultrasound
guidance, this vein was accessed with a 21 gauge micropuncture
needle.  Ultrasound image documentation was performed.  The needle
was exchanged over a guidewire for a peel-away sheath through which
a five French single lumen PICC trimmed to 38cm was advanced,
positioned with its tip at the distal SVC/right atrial junction.
Fluoroscopy during the procedure and fluoro spot radiograph
confirms appropriate catheter position.  The catheter was flushed,
secured to the skin with Prolene sutures, and covered with a
sterile dressing.  No immediate complication.
Fluoroscopy Time: 0.5 minutes.

[Series 1: run · 1 of 1 slices shown]
[im 1/1]
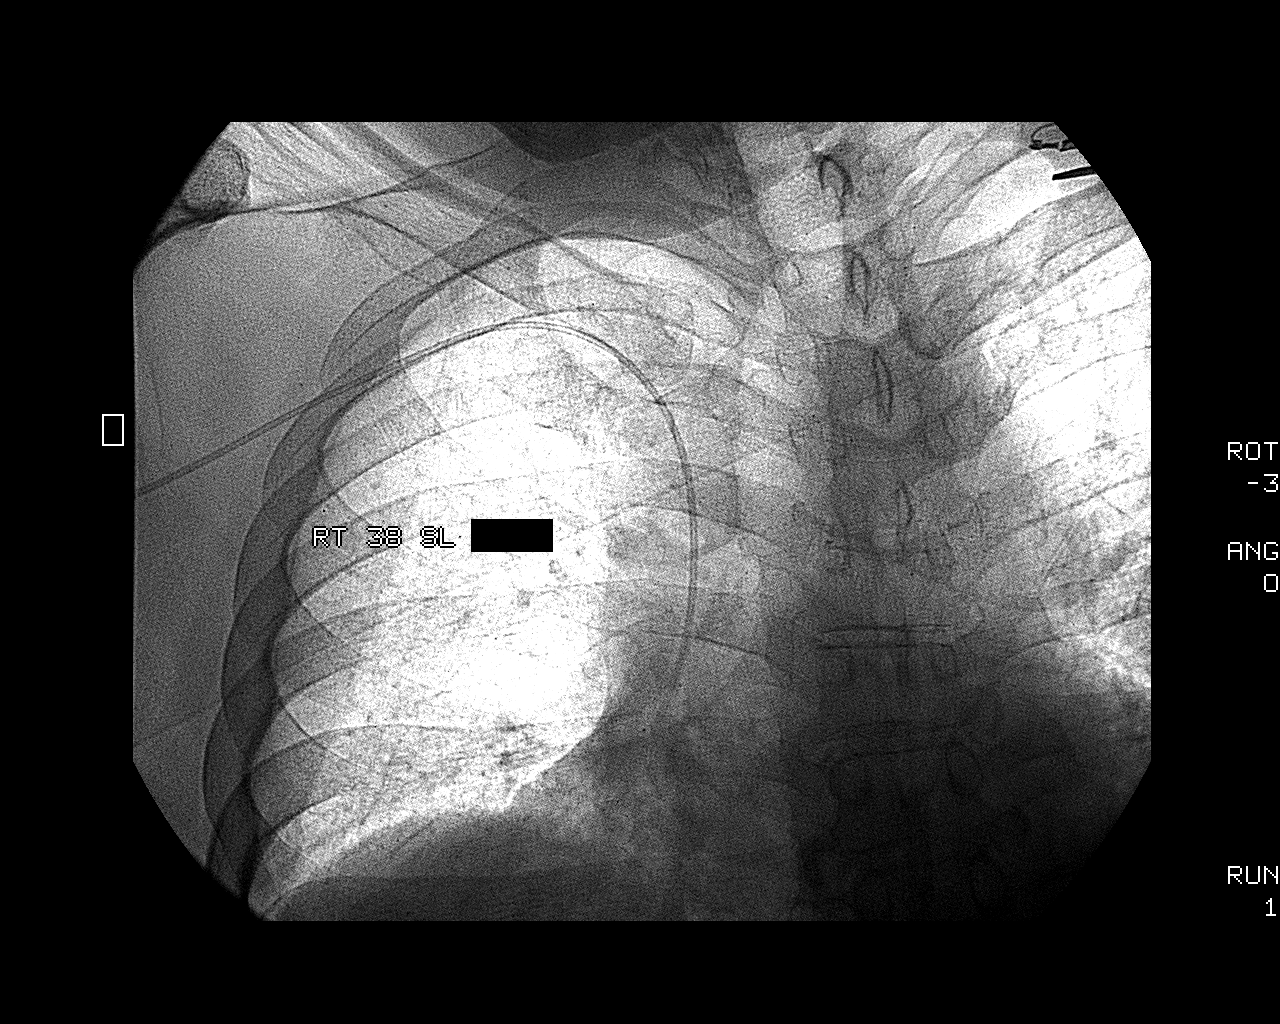

[1 of 1 positions shown; findings below may reference images not displayed]

IMPRESSION: Technically successful right arm PICC placement with ultrasound and
fluoroscopic guidance.  The catheter is ready for use.

## 2010-07-12 IMAGING — CR DG ABDOMEN ACUTE W/ 1V CHEST
4 series · 4 of 4 positions shown · non-contrast
Comparison: 09/28/2008

CLINICAL DATA: Abdominal pain

ACUTE ABDOMEN SERIES (ABDOMEN 2 VIEW & CHEST 1 VIEW)

[t abdomen supine (1 of 2)]
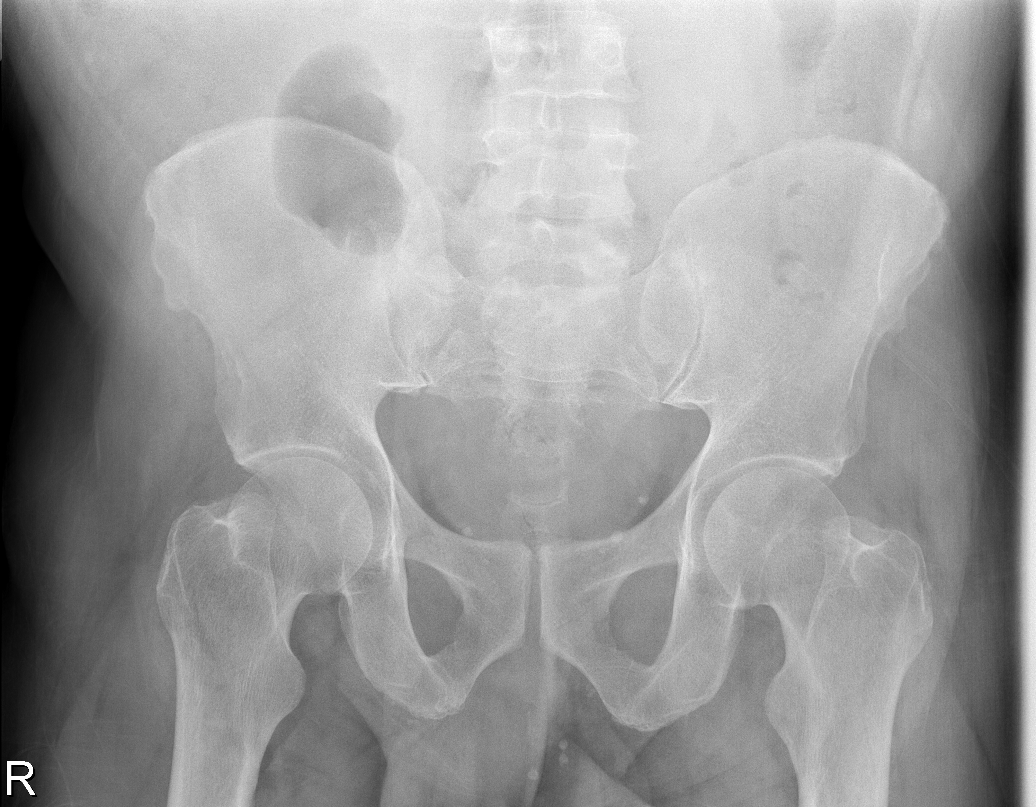

[t abdomen supine (2 of 2)]
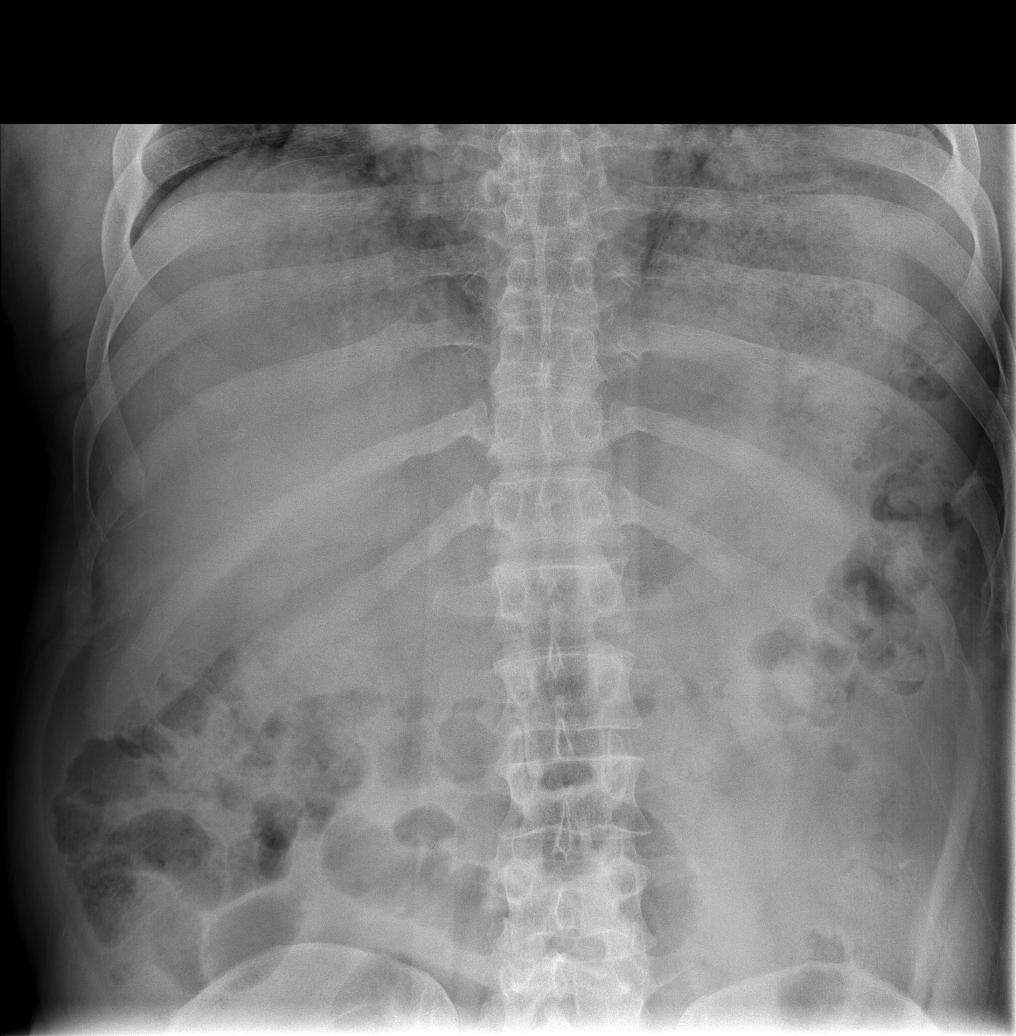

[w abdomen decub (1 of 2)]
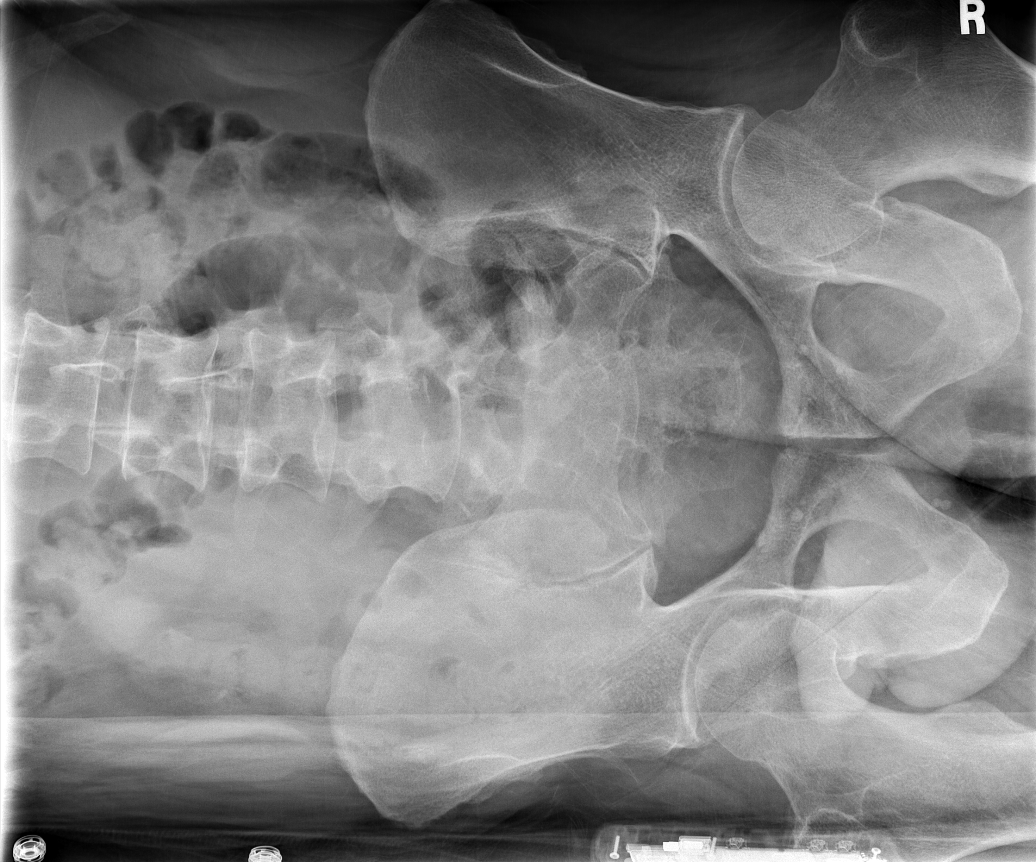

[w abdomen decub (2 of 2)]
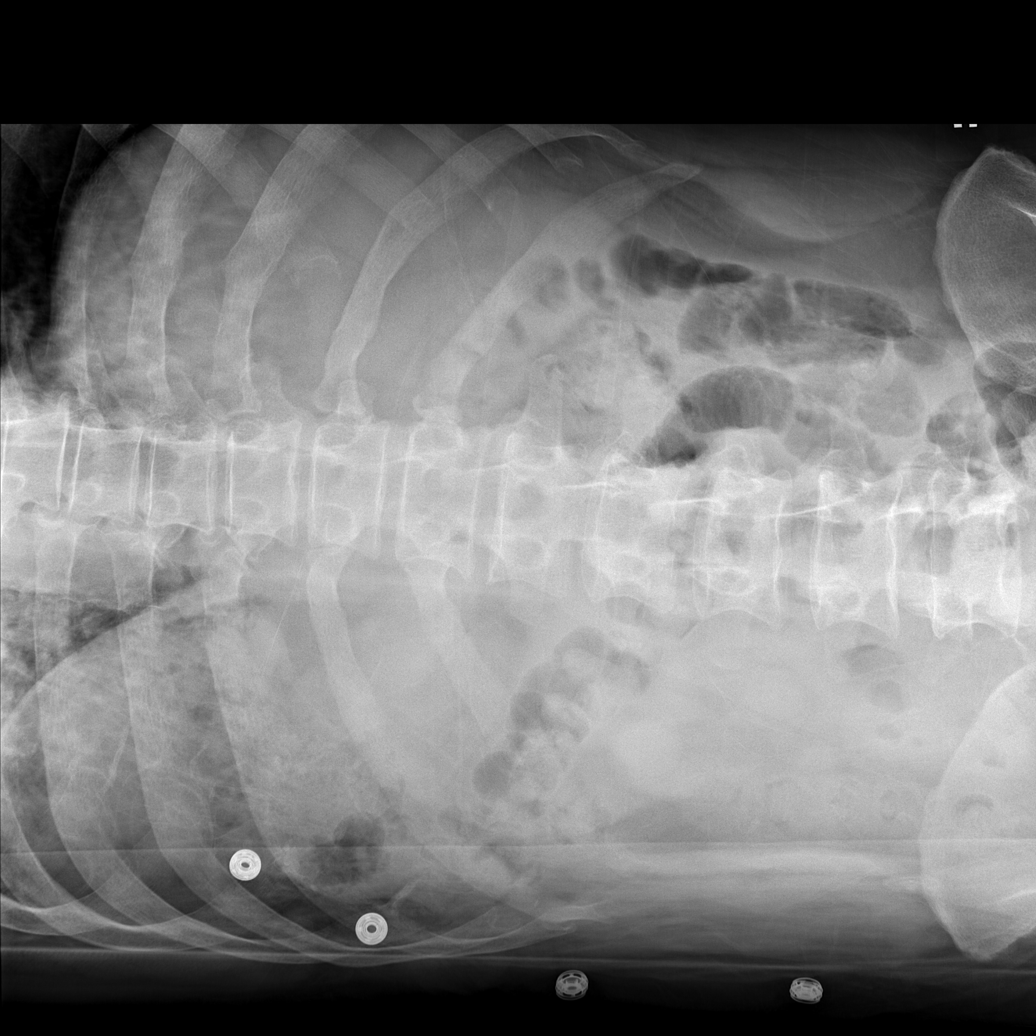

[4 of 4 positions shown; findings below may reference images not displayed]

FINDINGS: Pulmonary basilar subsegmental atelectasis.  Central
catheter is in the right atrium.  Nonobstructive bowel gas.  No
free air.
IMPRESSION: Pulmonary basilar subsegmental atelectasis.

Nonobstructive bowel gas.

## 2010-07-12 IMAGING — NM NM LIVER FUNCTION STUDY
2 series · 12 of 12 positions shown · non-contrast
Comparison: Ultrasound 02/17/2009

CLINICAL DATA: Abdominal pain.  Right upper quadrant pain.  Rule
out acute cholecystitis.  Stones by ultrasound.

NM LIVER FUNCTION STUDY
TECHNIQUE: Sequential abdominal images were obtained for
approximately 60 minutes following intravenous injection of
radiopharmaceutical.
Radiopharmaceutical: 5.1 mCi of technetium 99m Choletec; 3.5 mg of
intravenous morphine sulfate

[he hepatobiliary · 3.22mm/px · 6 of 45 frames shown (1 of 2)]
[frame 4/45]
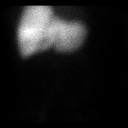
[frame 12/45]
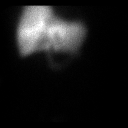
[frame 19/45]
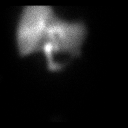
[frame 27/45]
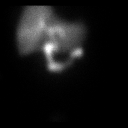
[frame 34/45]
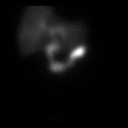
[frame 42/45]
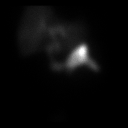

[he hepatobiliary · 3.22mm/px · 6 of 30 frames shown (2 of 2)]
[frame 3/30]
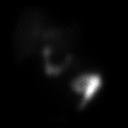
[frame 8/30]
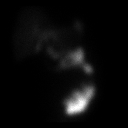
[frame 13/30]
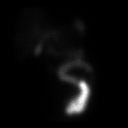
[frame 18/30]
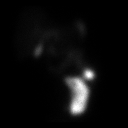
[frame 23/30]
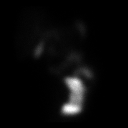
[frame 28/30]
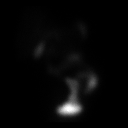

[12 of 12 positions shown; findings below may reference images not displayed]

FINDINGS: On early images, there is appropriate uptake within the
liver.  During the first hour, activity is identified within the
small bowel.  No evidence for activity within the gallbladder
however.

With administration of morphine sulfate, there is gradual activity
seen within the gallbladder.  Findings are consistent with chronic
cholecystitis.
IMPRESSION: Delayed filling of the gallbladder consistent with chronic
cholecystitis.

## 2010-07-12 IMAGING — US US ABDOMEN COMPLETE
1 series · 14 of 25 positions shown · non-contrast
Comparison: None

CLINICAL DATA: Abdominal pain

COMPLETE ABDOMINAL ULTRASOUND

[Series 1: us abdomen complete · 0.30mm/px · 14 of 53 slices shown]
[im 1/53]
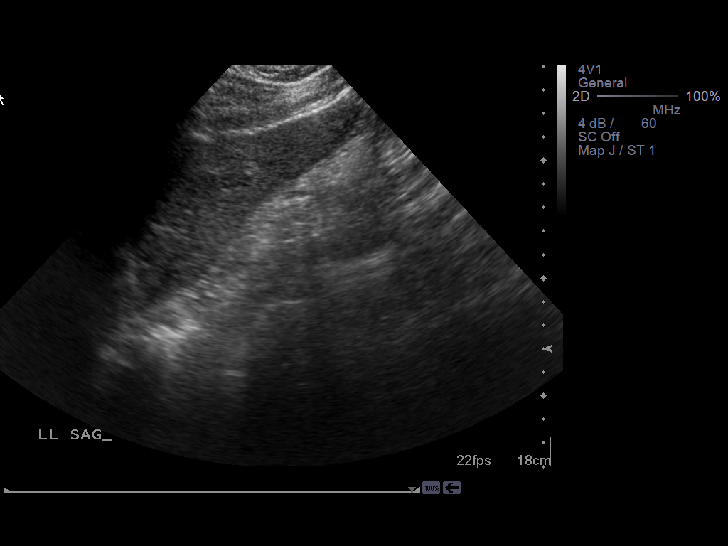
[im 5/53]
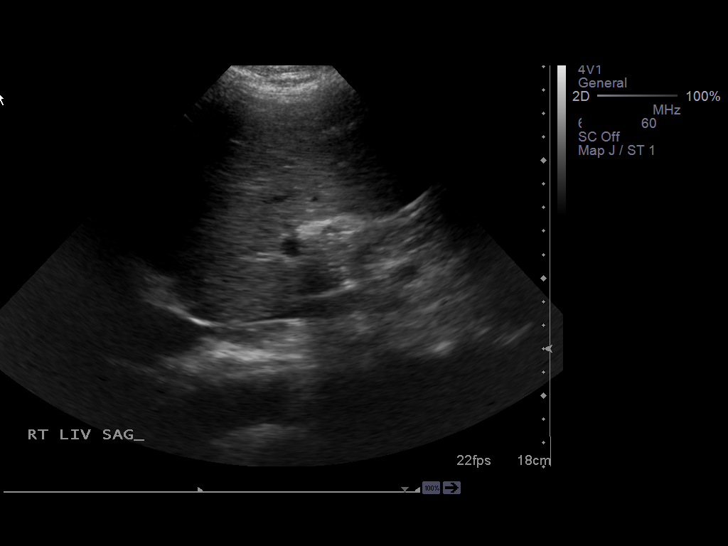
[im 9/53]
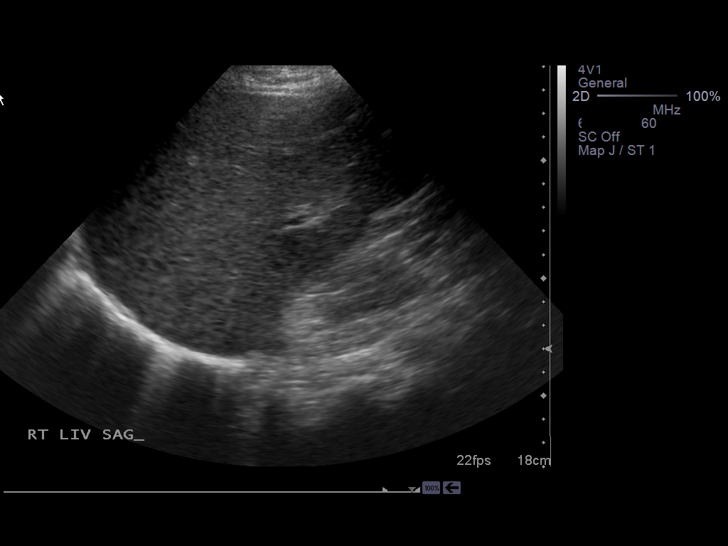
[im 14/53]
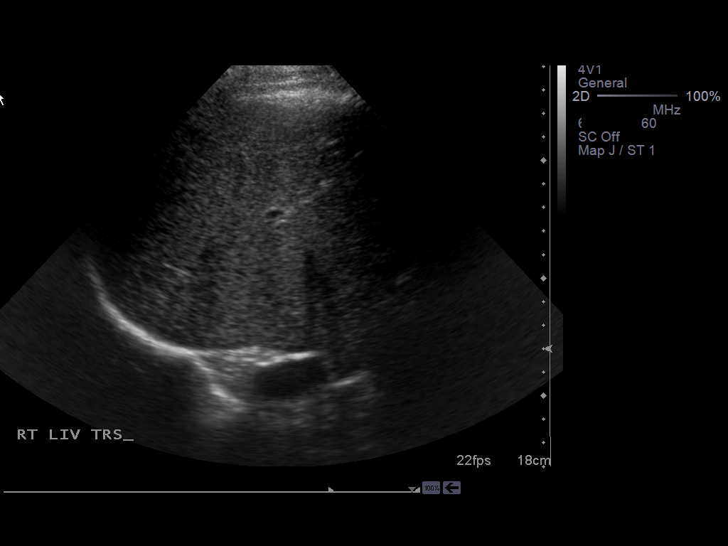
[im 18/53]
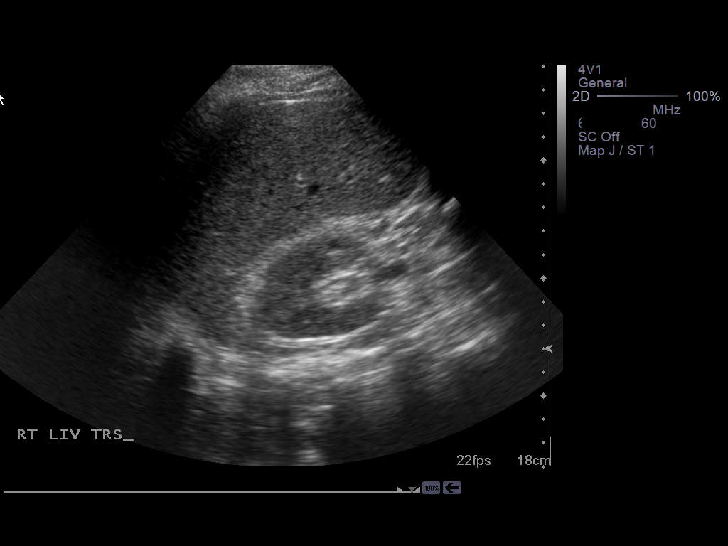
[im 20/53]
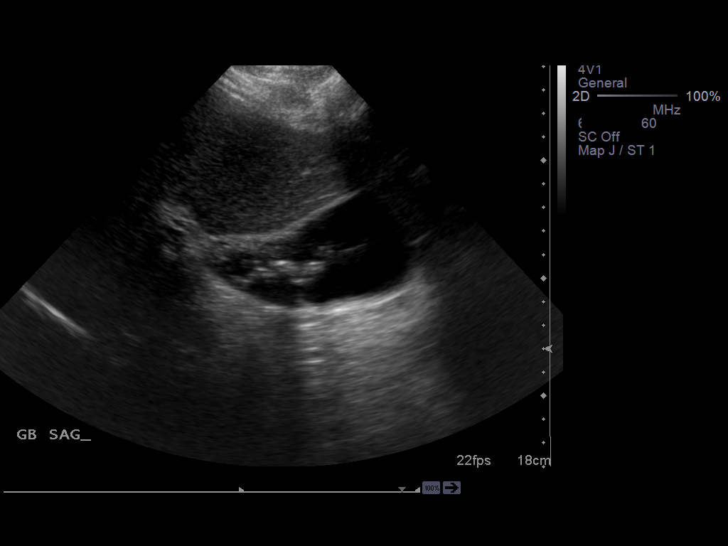
[im 24/53]
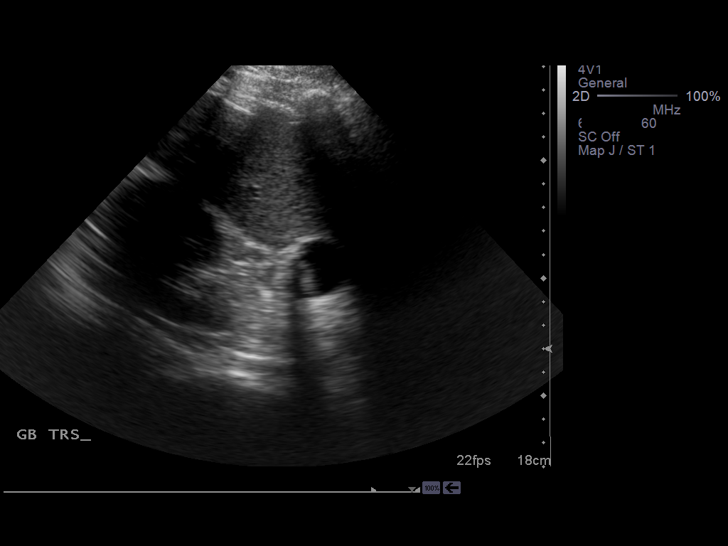
[im 29/53]
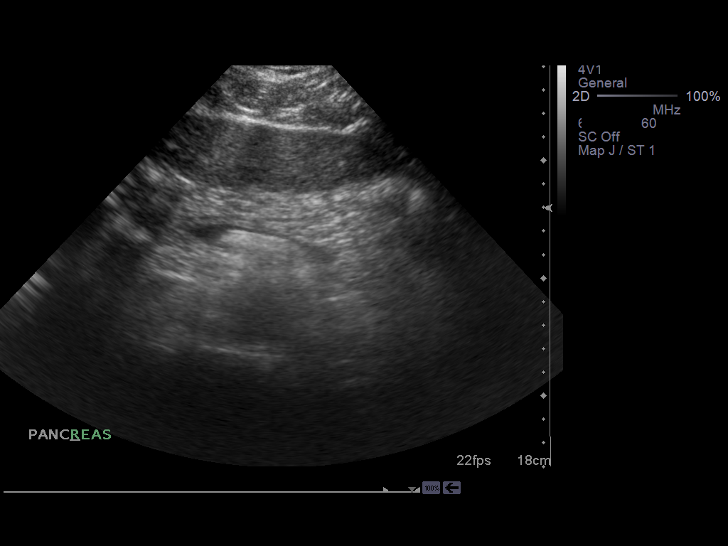
[im 33/53]
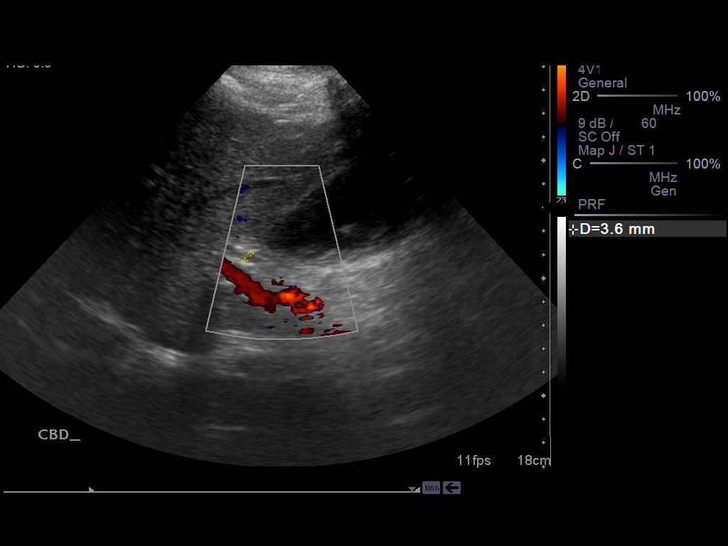
[im 35/53]
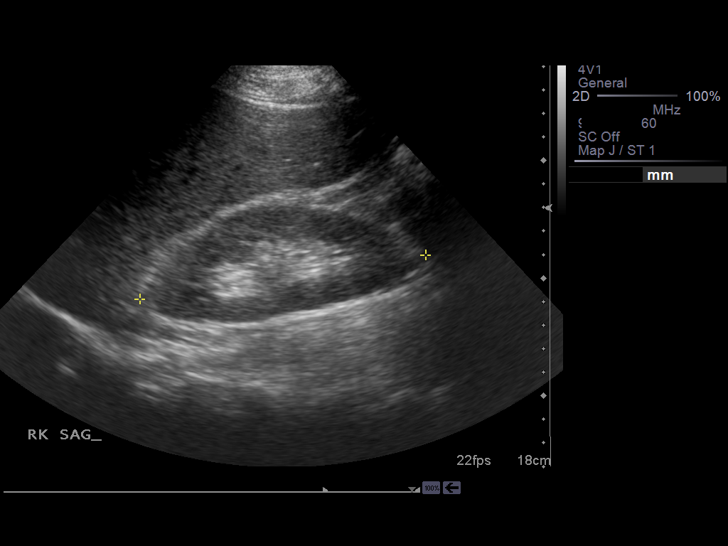
[im 40/53]
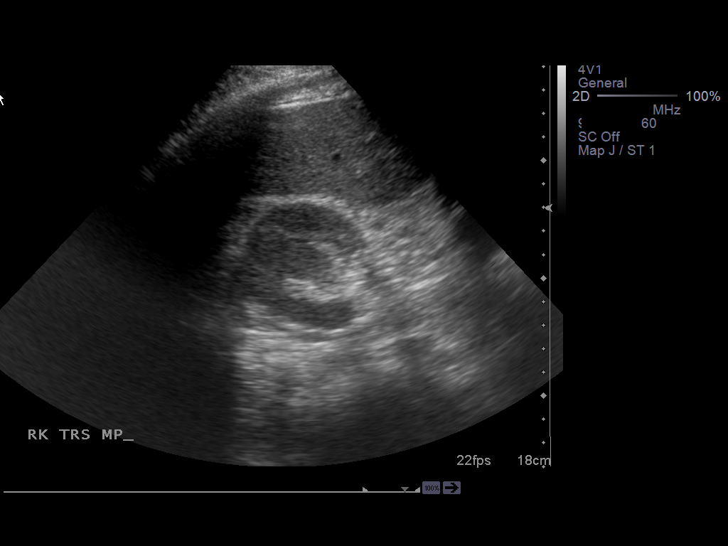
[im 44/53]
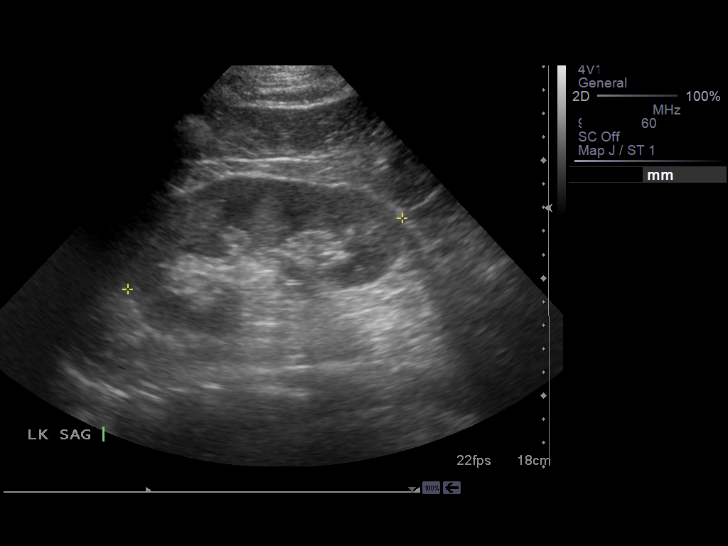
[im 48/53]
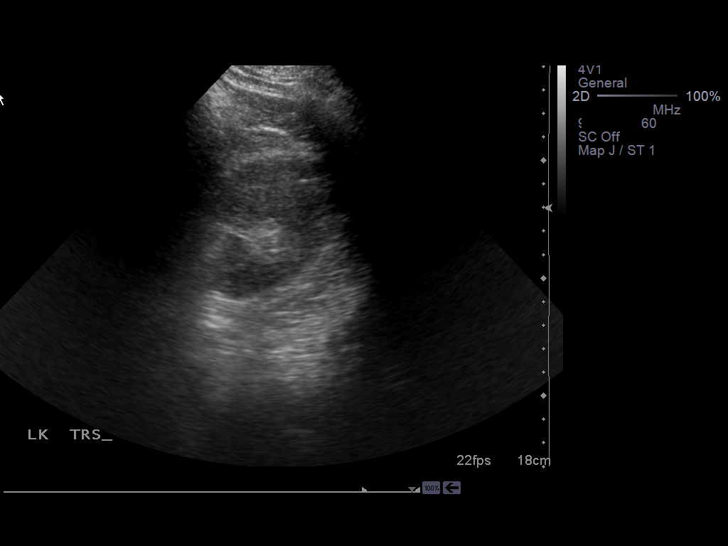
[im 53/53]
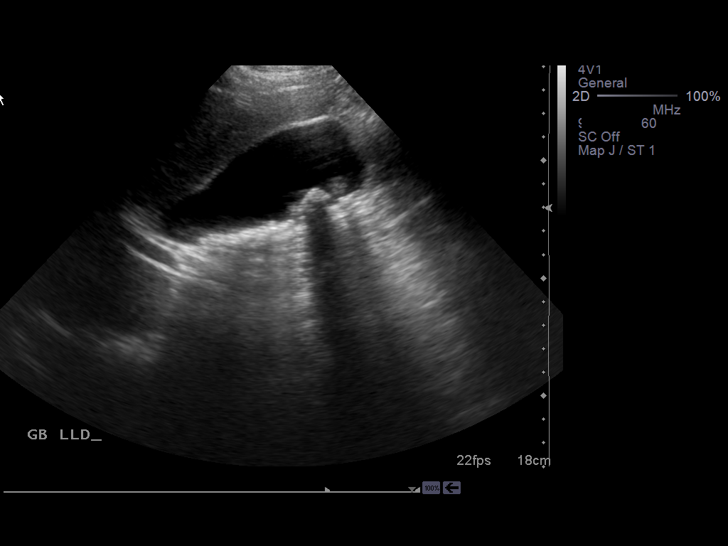

[14 of 25 positions shown; findings below may reference images not displayed]

FINDINGS: The liver is normal in contour and echogenicity.  The
gallbladder is thin-walled at 1.8 mm.  No evidence of
pericholecystic fluid.  There are several dependent echogenic
gallstones,  the largest measuring 1.2 cm.  Stones number
approximately 10.   Negative sonographic Murphy's sign.  Common
bile duct is normal diameter 4.2 mm.

The IVC and pancreas appear normal.

The spleen is normal echogenicity measuring 7.6 cm.

Right kidney measures 12.3 cm.  Left kidney measures 12.8 cm. No
evidence of hydronephrosis.

Abdominal aorta is normal in diameter 2.4 cm.
IMPRESSION: Cholelithiasis without evidence of cholecystitis.

## 2010-07-15 IMAGING — RF DG CHOLANGIOGRAM OPERATIVE
1 series · 8 of 8 positions shown · non-contrast
Comparison: Hepatobiliary scan and ultrasound.

CLINICAL DATA: Assess for ductal stone.

INTRAOPERATIVE CHOLANGIOGRAM
TECHNIQUE: Multiple fluoroscopic spot radiographs were obtained
during intraoperative cholangiogram and are submitted for
interpretation post-operatively.
Fluoroscopy Time: 41 seconds

[Series 1: run · 2 acquisitions, 8 frames shown]
[im 1/2]
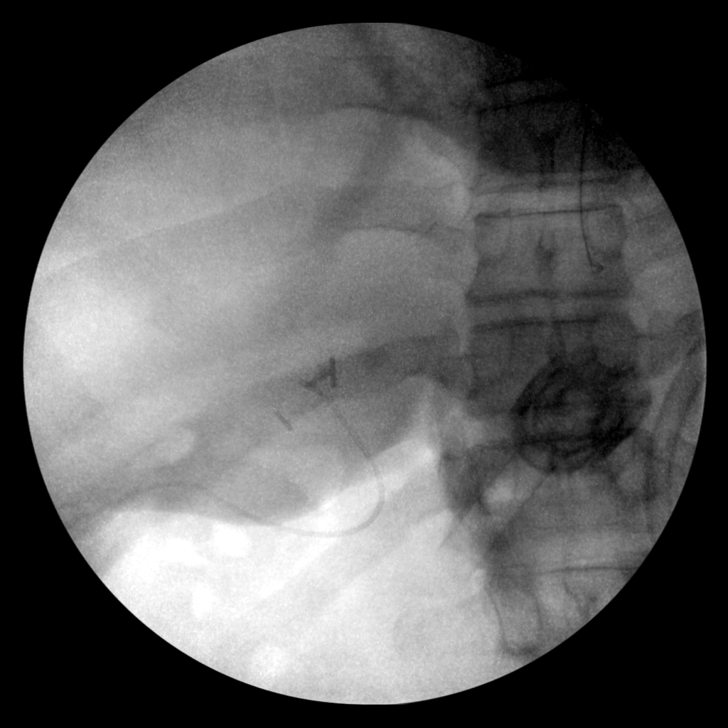
[im 1/2]
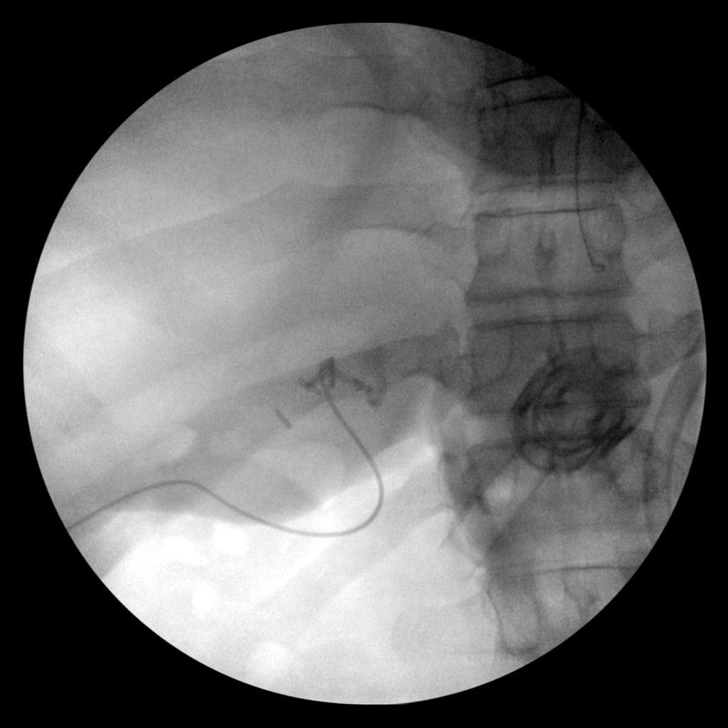
[im 1/2]
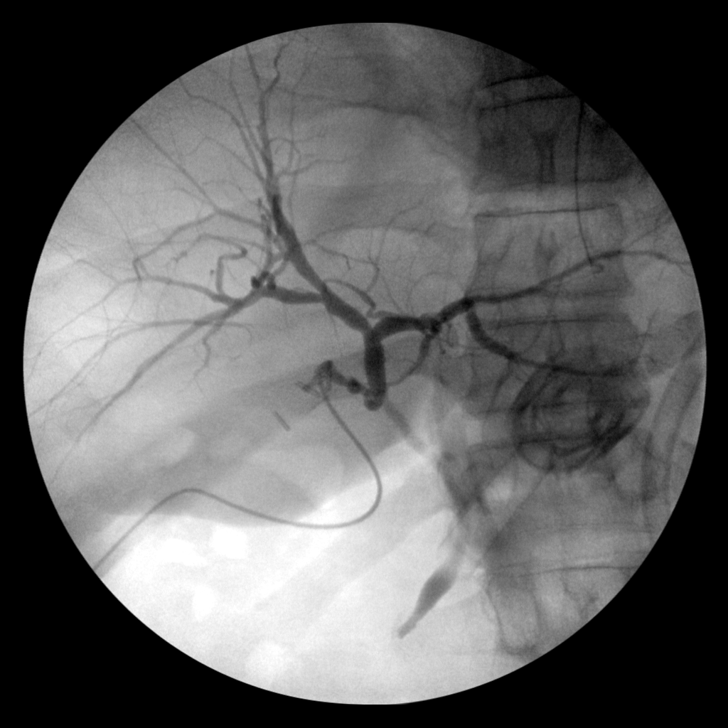
[im 1/2]
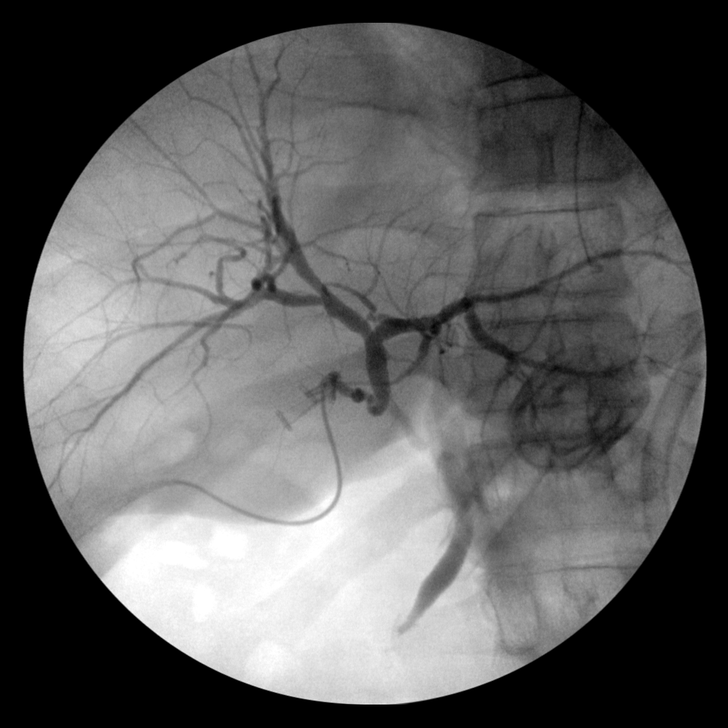
[im 2/2]
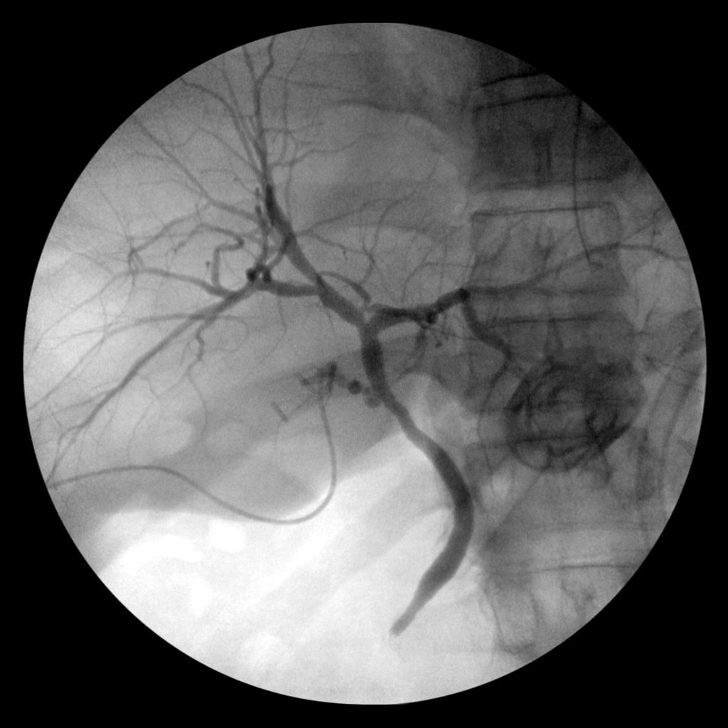
[im 2/2]
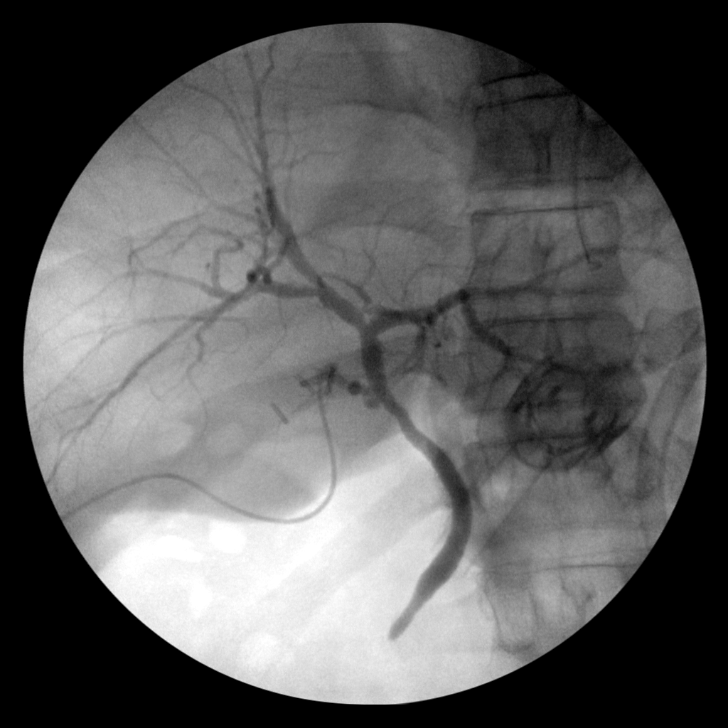
[im 2/2]
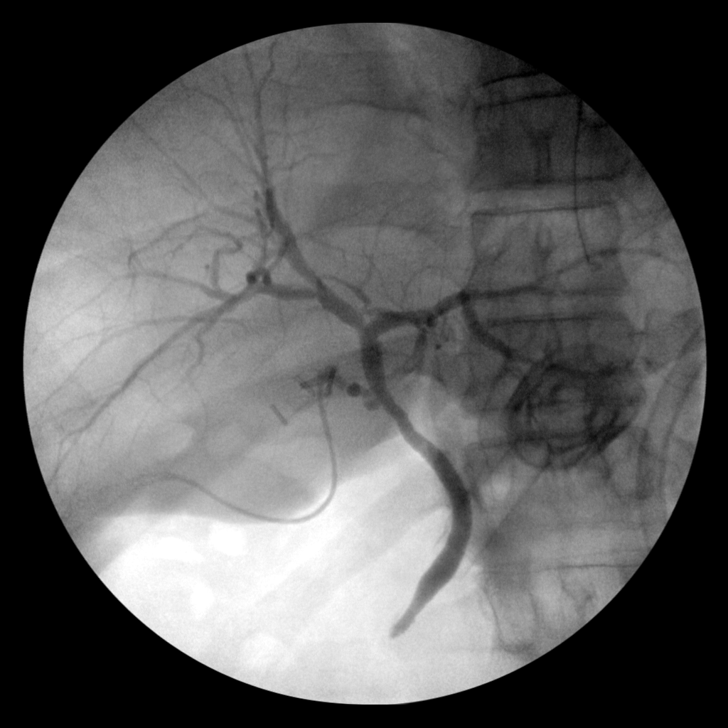
[im 2/2]
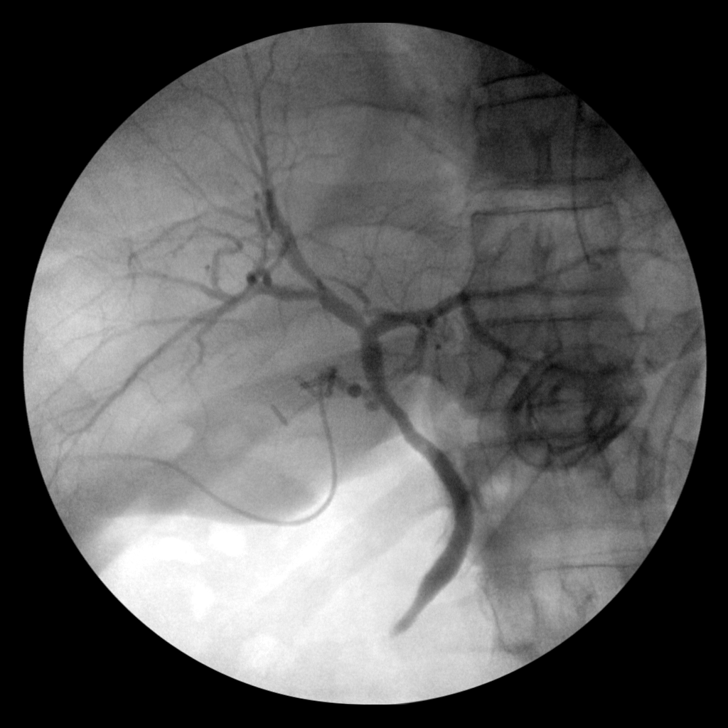

[8 of 8 positions shown; findings below may reference images not displayed]

FINDINGS: Cystic duct was injected and there was then excellent
degree of contrast opacification of the biliary ducts.  There is a
filling defect in the very distal CBD consistent with a stone.  No
contrast material enters the duodenum.
IMPRESSION: Findings would be compatible with a stone in the distal CBD.  Note
that the sphincter of Oddi spasm may occasionally present with this
appearance.

## 2010-07-20 IMAGING — CT CT ANGIO CHEST
2 of 10 series · 18 of 37 positions shown · IV contrast (APPLIED)
Comparison: None available for chest CT.  Abdominal CT comparison
09/21/2008.

CLINICAL DATA: Abdominal surgery.  Abdominal pain and leg
swelling.  Short of breath.  Cough.

CT ANGIOGRAPHY CHEST
TECHNIQUE: Multidetector CT imaging of the chest was performed
using the standard protocol during bolus administration of
intravenous contrast. Multiplanar CT image reconstructions
including MIPs were obtained to evaluate the vascular anatomy.
Contrast: 100 ml [id number].
TECHNIQUE: Multidetector CT imaging of the abdomen and pelvis was
performed using the standard protocol following bolus
administration of intravenous contrast.
CT ABDOMEN

[Series 8: pulm embolism 1.0 thins · axial · 0.72mm/px · z∈[+958,+1186]mm · 14 of 263 slices shown]
[im 18/263  lung]
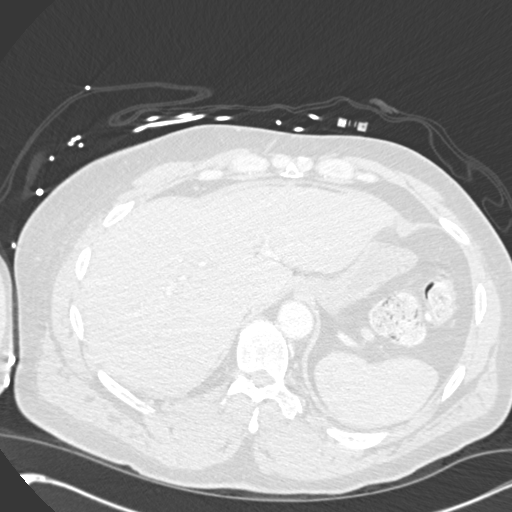
[im 35/263  mediastinal]
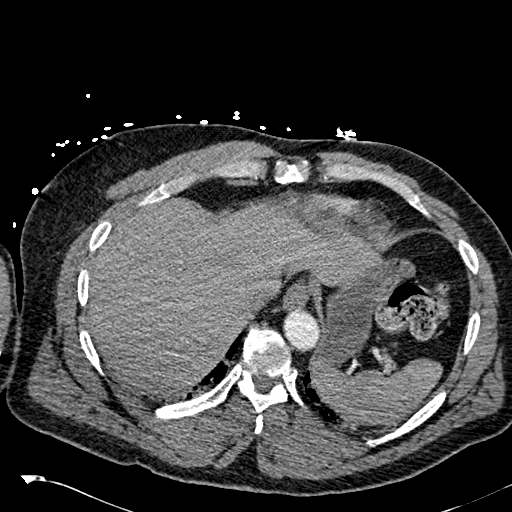
[im 53/263  lung]
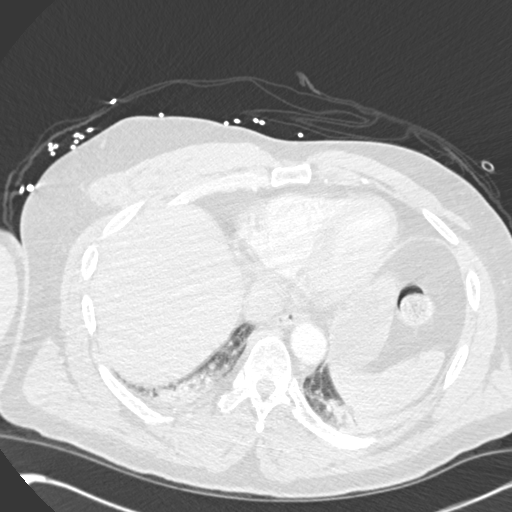
[im 70/263  mediastinal]
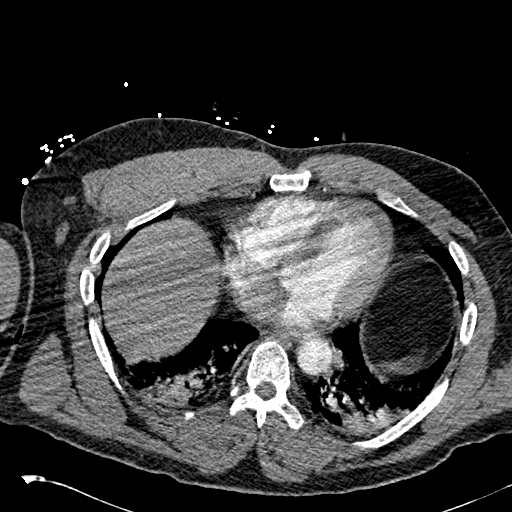
[im 88/263  lung]
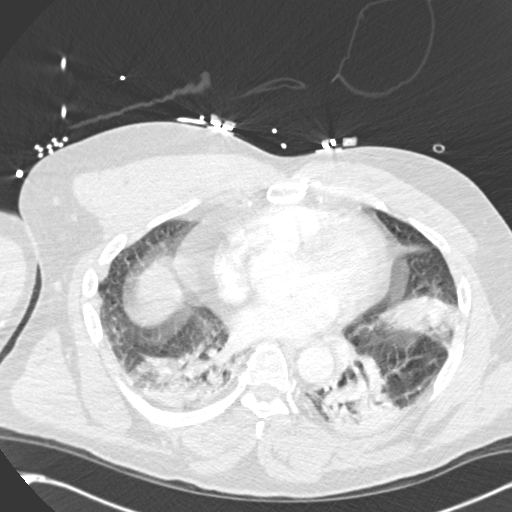
[im 105/263  mediastinal]
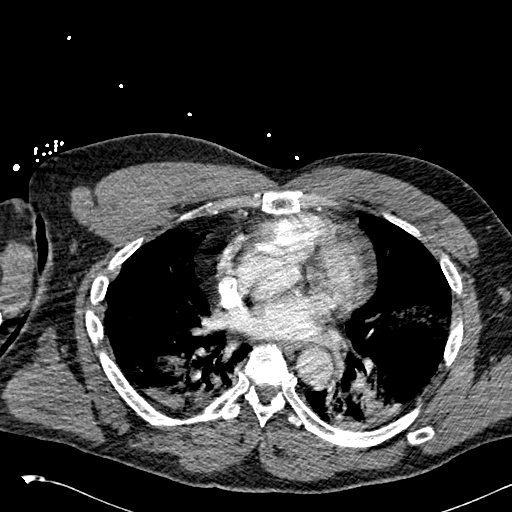
[im 123/263  lung]
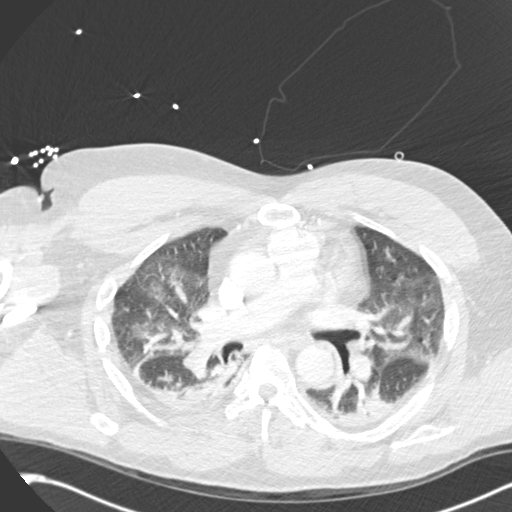
[im 140/263  mediastinal]
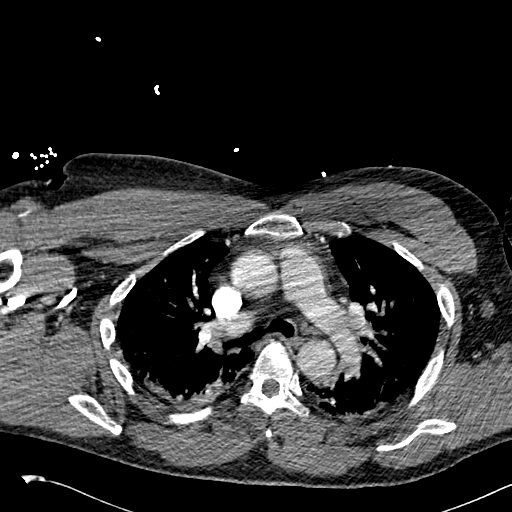
[im 158/263  lung]
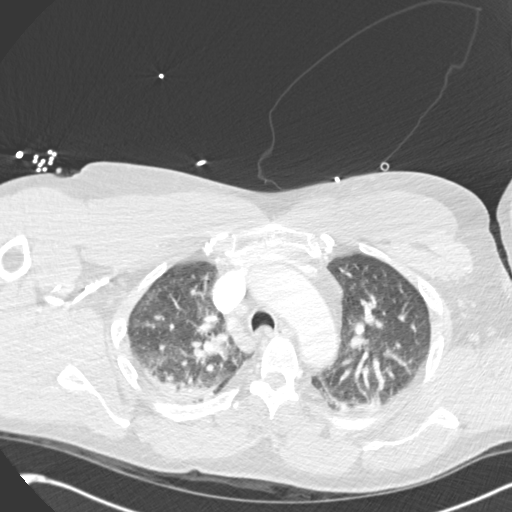
[im 175/263  mediastinal]
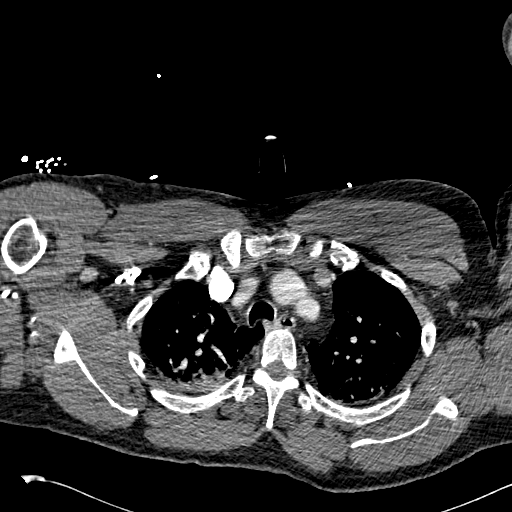
[im 193/263  lung]
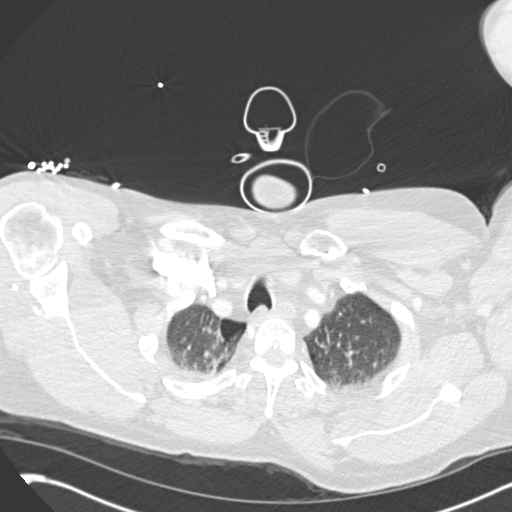
[im 210/263  mediastinal]
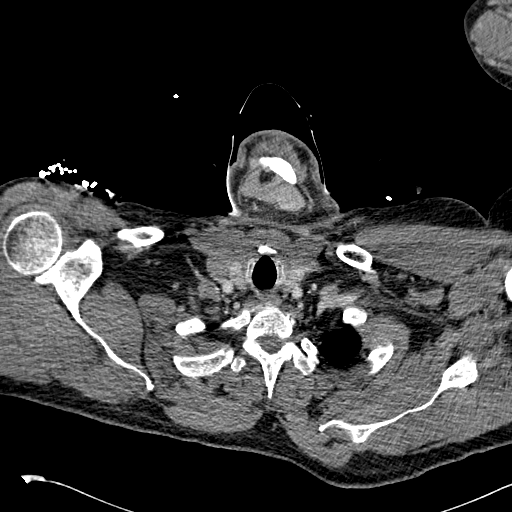
[im 228/263  lung]
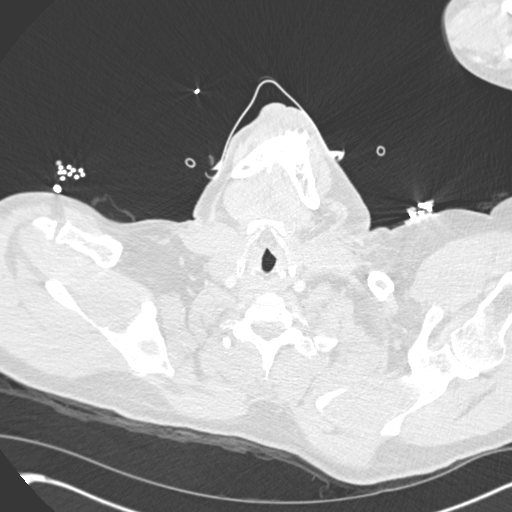
[im 245/263  mediastinal]
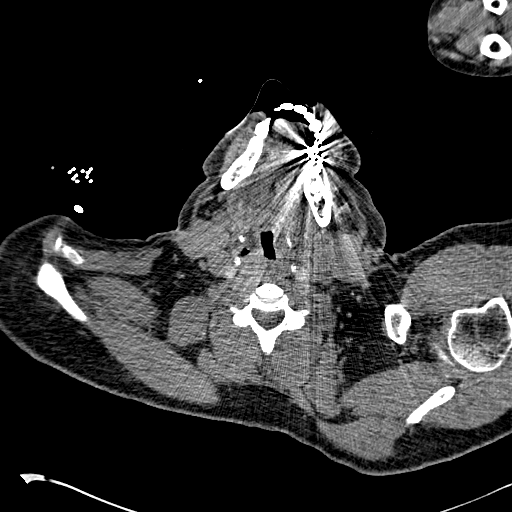

[Series 10: abd/pelv with 5.0 b31f st · axial · 0.81mm/px · z∈[+688,+988]mm · 4 of 102 slices shown]
[im 21/102  lung]
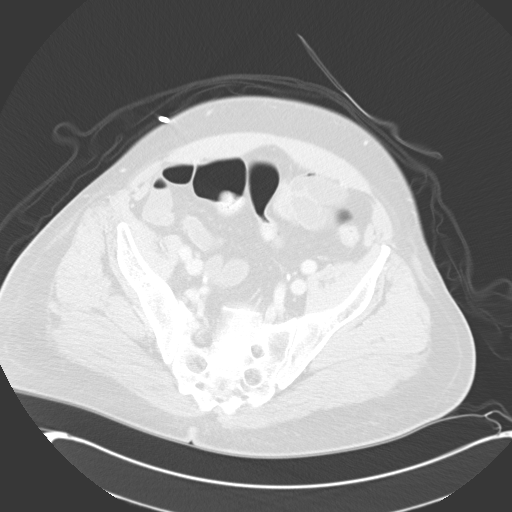
[im 41/102  lung]
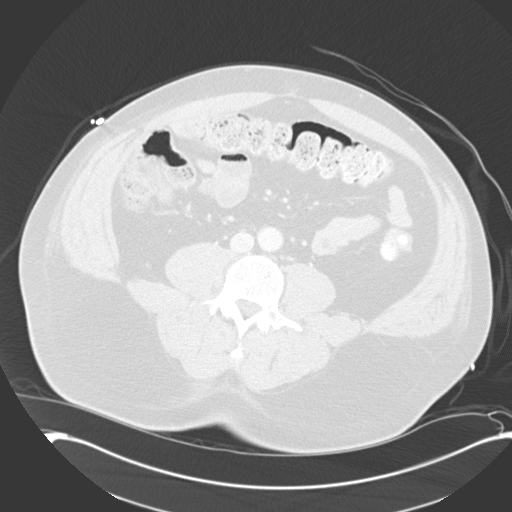
[im 61/102  lung]
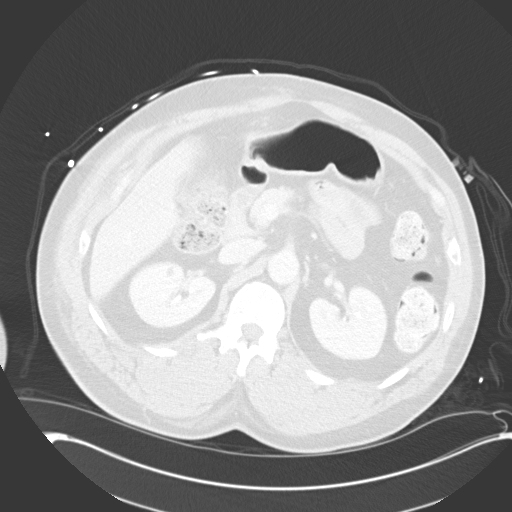
[im 81/102  lung]
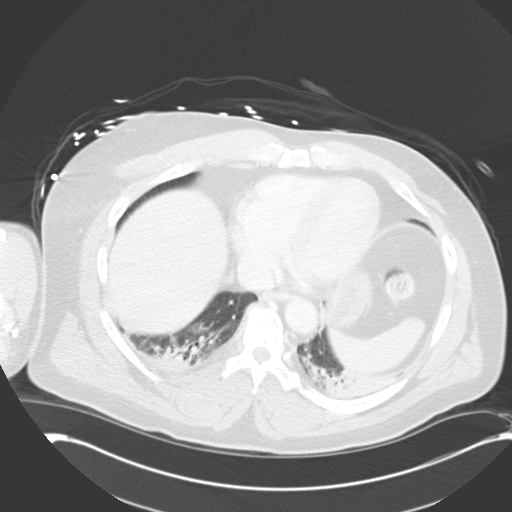

[18 of 37 positions shown; findings below may reference images not displayed]

FINDINGS: The study is technically adequate for pulmonary embolus.
Pulmonary embolism is present in the right lower lobe pulmonary
arteries.  Although the bolus timing is suboptimal, the diagnostic
certainty for pulmonary embolus is high, and this is seen on both
axial and reconstructed images.  There is marked bibasilar
atelectasis with collapse / consolidation of both lower lobes.  The
motion artifact precludes full evaluation.  Ground-glass opacity is
present in the lungs, likely representing superimposed pulmonary
edema versus excretory phase imaging.  No axillary adenopathy is
present.  No evidence of right heart strain.  Pulmonary blebs are
present at the right apex.

 Review of the MIP images confirms the above findings.
IMPRESSION: 1.  Positive pulmonary embolism study.  Right lower lobe pulmonary
emboli are present.  Exam is limited but presence of pulmonary
embolus is unequivocal.
2.  Ground-glass attenuation of the lungs may represent edema or
excretory phase imaging.
3.  Motion artifact limited exam.
4.  No evidence of right heart strain.
5. Critical test results telephoned to Dr. Baltaza Mytha at the time of
interpretation on 02/25/2009 at 1639 hours.

CT ABDOMEN AND PELVIS WITH CONTRAST
FINDINGS: Liver appears within normal limits.  Stranding is
present in the gallbladder fossa, presumably relating to recent
cholecystectomy.  No abscess is present.  Cholecystectomy clips are
present.  Spleen appears within normal limits.  Adrenal glands and
kidneys unremarkable bilaterally.  Abdominal aortic
atherosclerosis.  Stomach and proximal small bowel appear normal.
Too small to characterize right interpolar and left lower polar
renal lesions are present, statistically likely representing cysts.
IMPRESSION: 1.  Stranding in the gallbladder fossa, presumably relating to
recent cholecystectomy.  No abscess or complicating features.

CT PELVIS
FINDINGS: There is a small amount of gas in the periumbilical soft
tissues, likely relating to prior laparoscopic port.  No abscess is
present.  No free fluid in the anatomic pelvis.  Foley catheter in
the urinary bladder.  Normal appendix identified.  Minimal
inflammatory change at the hepatic flexure of the colon associated
with prior cholecystectomy.  Bones show SI joint and lumbar
degenerative disease.
IMPRESSION: 1.  No acute pelvic abnormality.
2.  Stranding around the umbilicus is a likely secondary to
laparoscopic port.  No definite abscess.

## 2010-07-20 IMAGING — CR DG CHEST 1V
1 series · 1 of 1 positions shown · non-contrast
Comparison: None available

CLINICAL DATA: Surgery.  Abdominal pain.  Leg swelling.  Cough.

CHEST - 1 VIEW

[view not recorded]
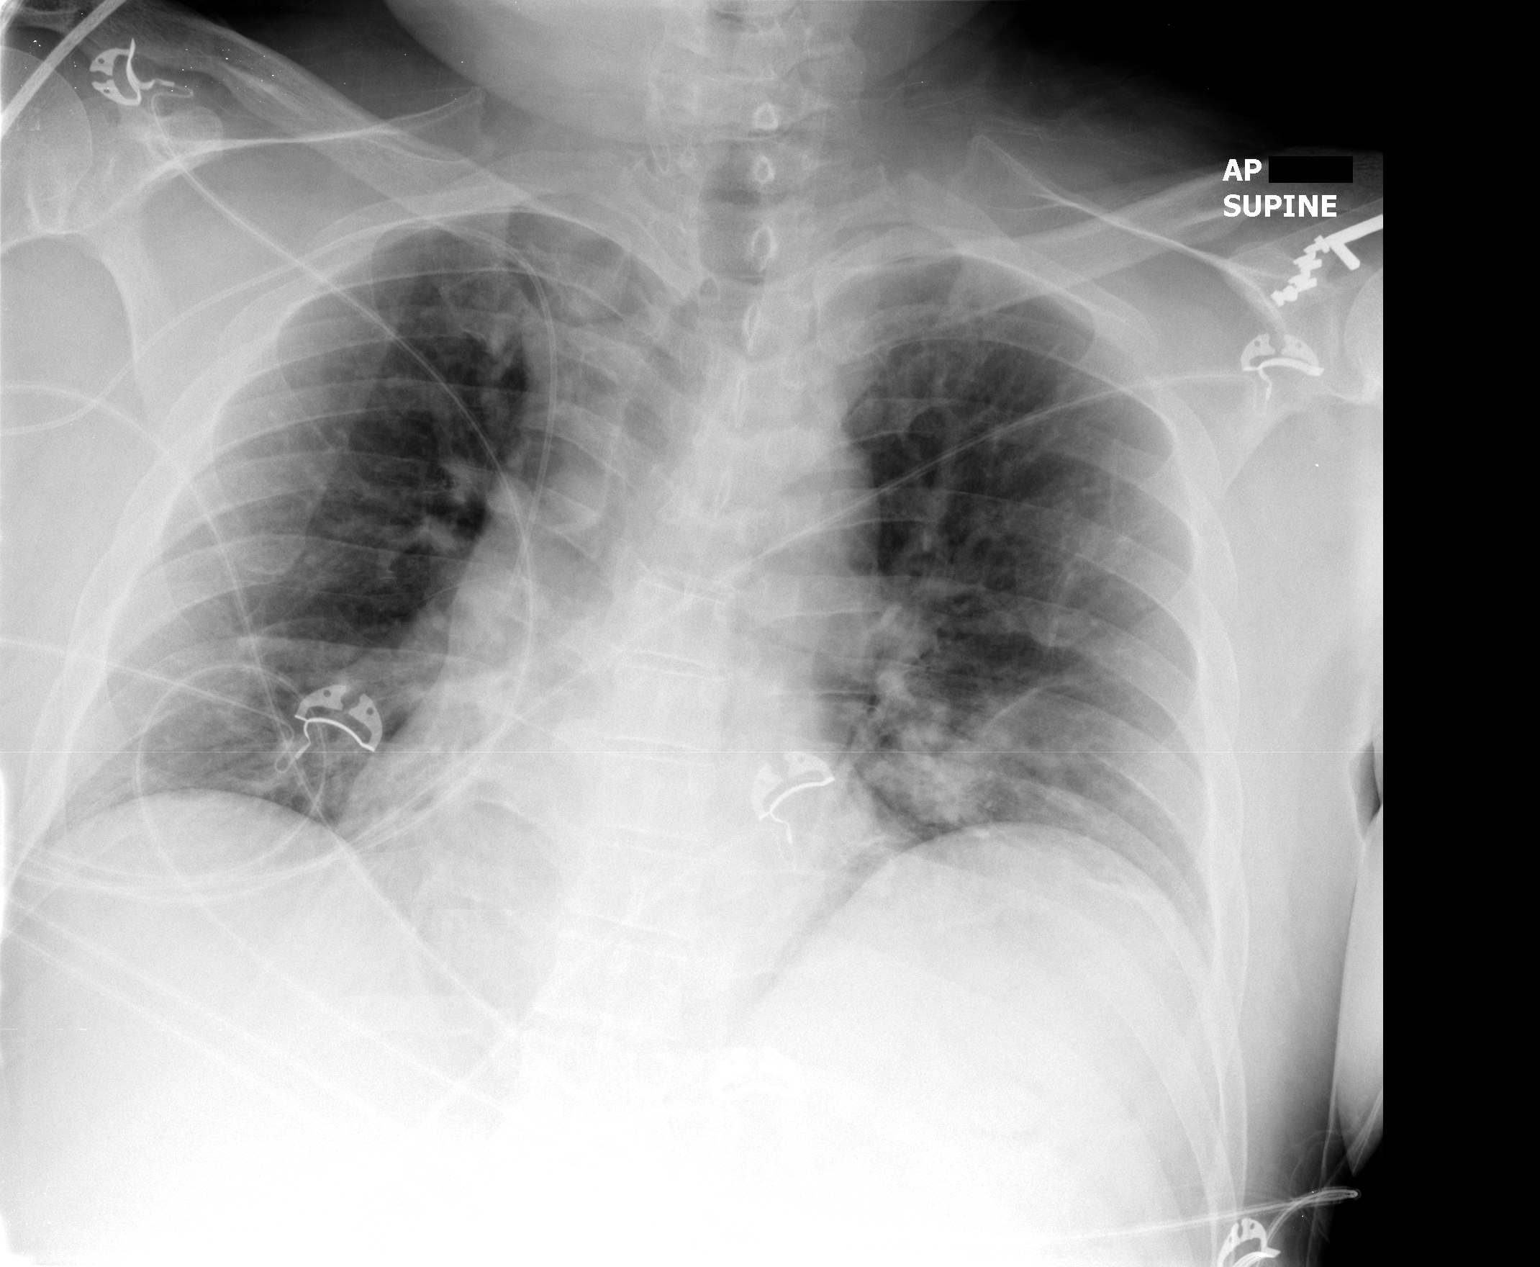

[1 of 1 positions shown; findings below may reference images not displayed]

FINDINGS: Low lung volumes are present.  Bibasilar atelectasis.
Right upper extremity PICC is present with the tip terminating in
the mid SVC.  Low lung volumes accentuate the size of the
cardiopericardial silhouette.  No definite airspace disease is
identified.  Refer to CT.
IMPRESSION: Low volume chest.  Right upper extremity PICC with the tip in the
mid SVC.

## 2010-07-30 IMAGING — CT CT PELVIS W/ CM
2 of 5 series · 17 of 46 positions shown, 19 images · IV contrast (water- bmi proto & 80 ml omni 300)
Comparison: 02/25/2009

CT ABDOMEN

CLINICAL DATA: Recent cholecystectomy.  Right sided abdominal
pain, fever, chills.

CT ABDOMEN AND PELVIS WITH CONTRAST
TECHNIQUE: Multidetector CT imaging of the abdomen and pelvis was
performed using the standard protocol following bolus
administration of intravenous contrast.
Contrast: 80 ml Hmnipaque-DSS

[Series 2: routine abdomen · axial · 0.84mm/px · z∈[-457,-32]mm · 14 of 96 slices shown, 16 images]
[im 6/96  soft-tissue]
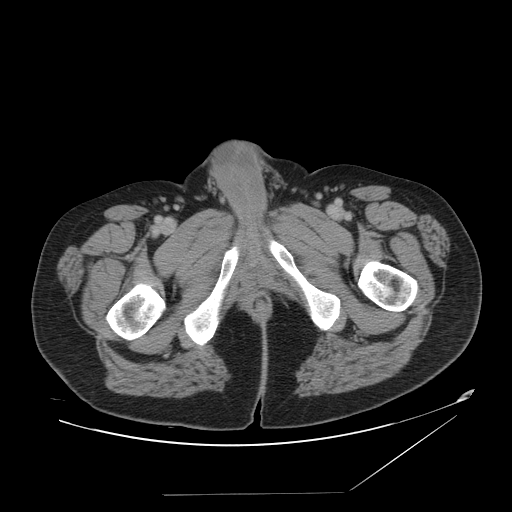
[im 6/96  bone]
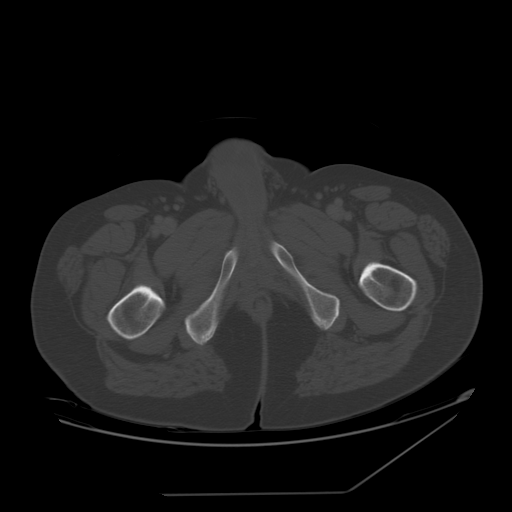
[im 11/96  soft-tissue]
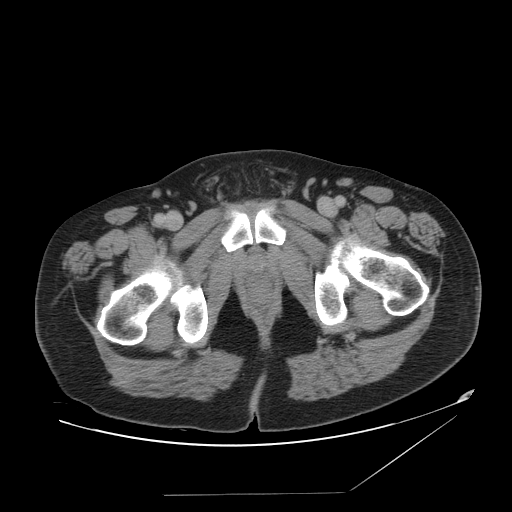
[im 21/96  soft-tissue]
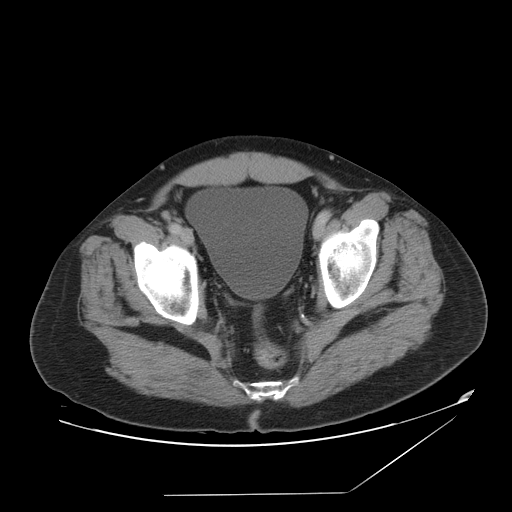
[im 26/96  soft-tissue]
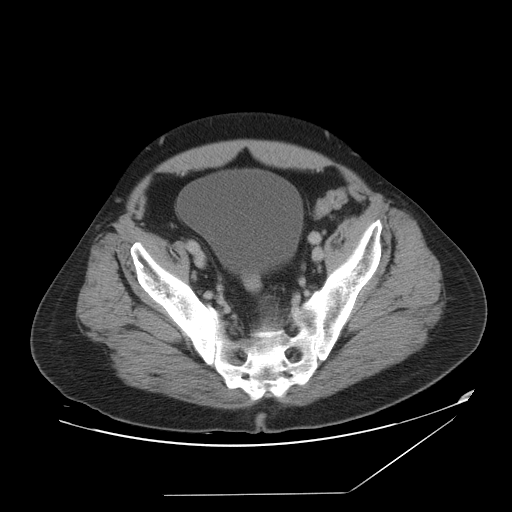
[im 31/96  soft-tissue]
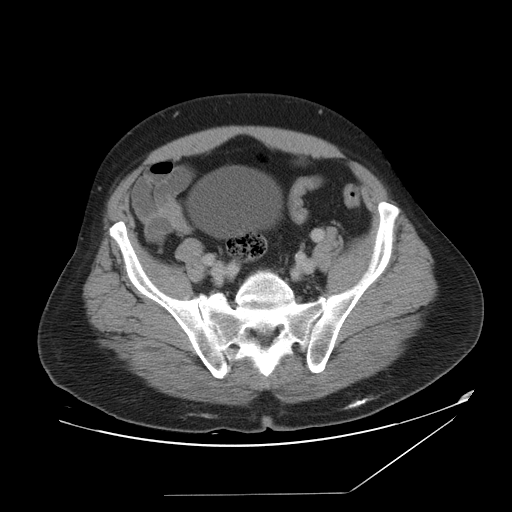
[im 41/96  soft-tissue]
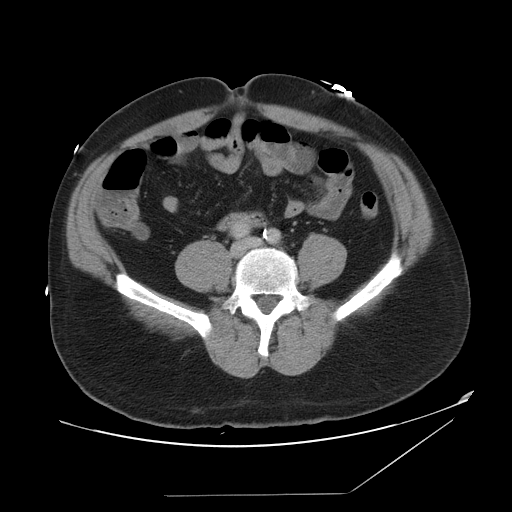
[im 46/96  soft-tissue]
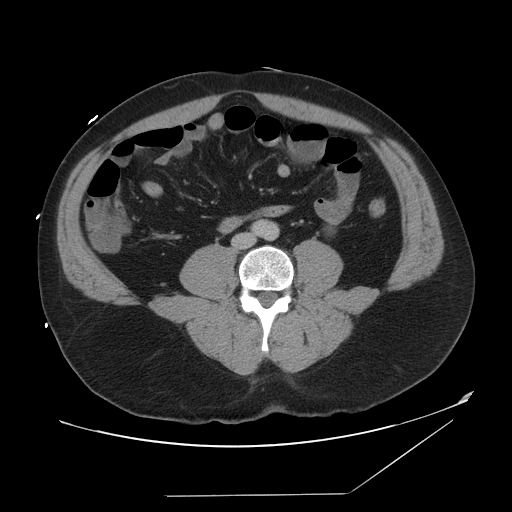
[im 51/96  soft-tissue]
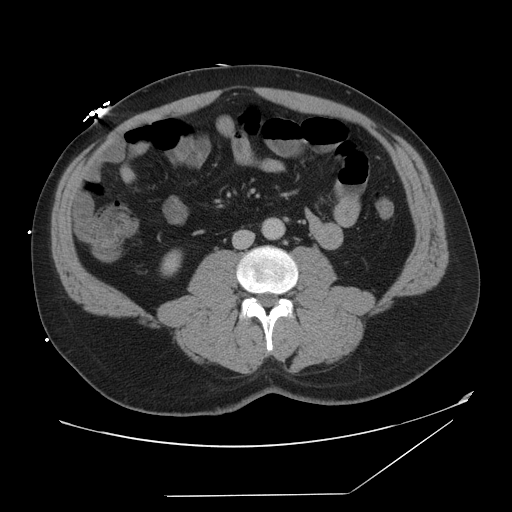
[im 56/96  soft-tissue]
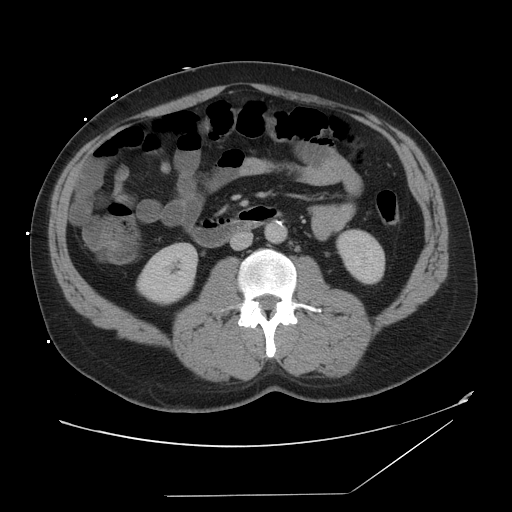
[im 56/96  bone]
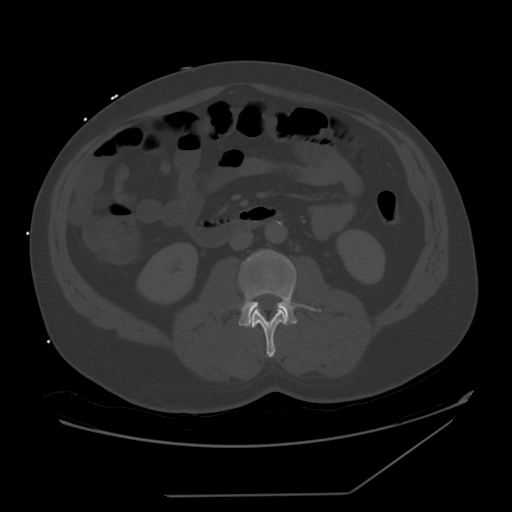
[im 66/96  soft-tissue]
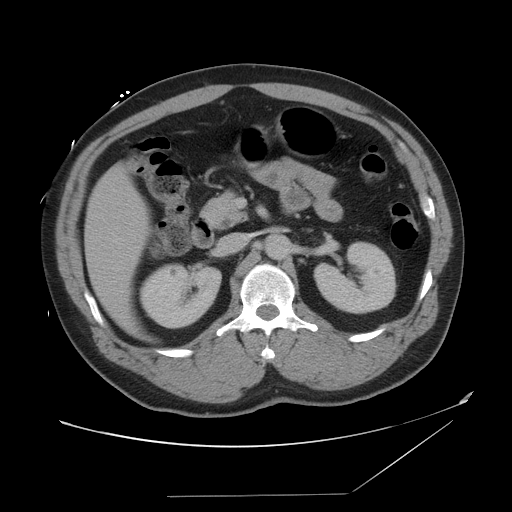
[im 71/96  soft-tissue]
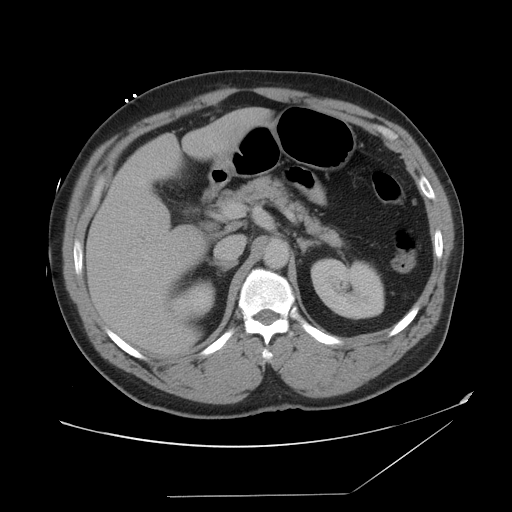
[im 76/96  soft-tissue]
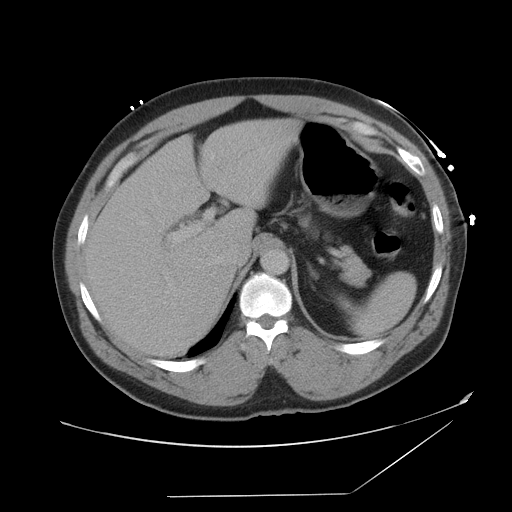
[im 86/96  soft-tissue]
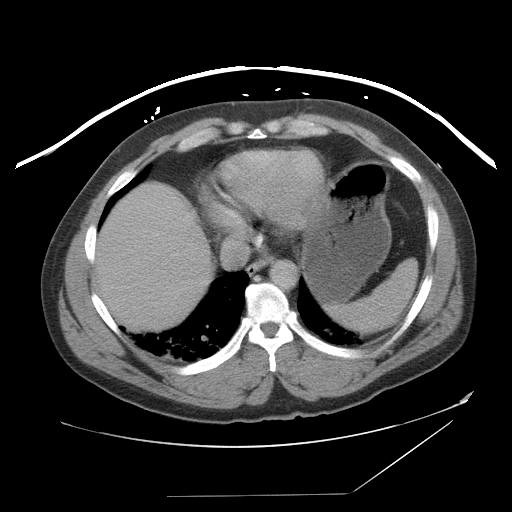
[im 91/96  soft-tissue]
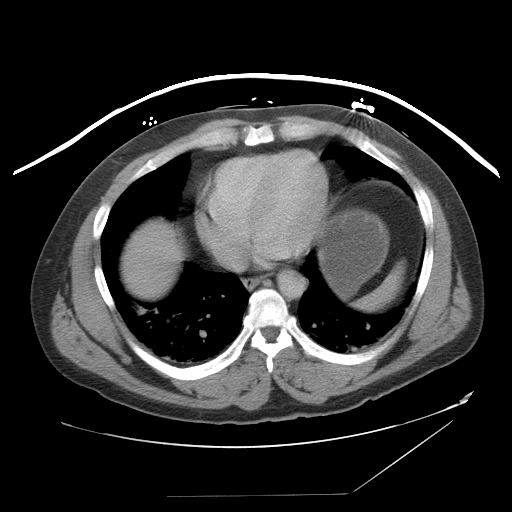

[Series 401: cor · coronal · 0.95mm/px · 3 of 105 slices shown]
[im 35/105  soft-tissue]
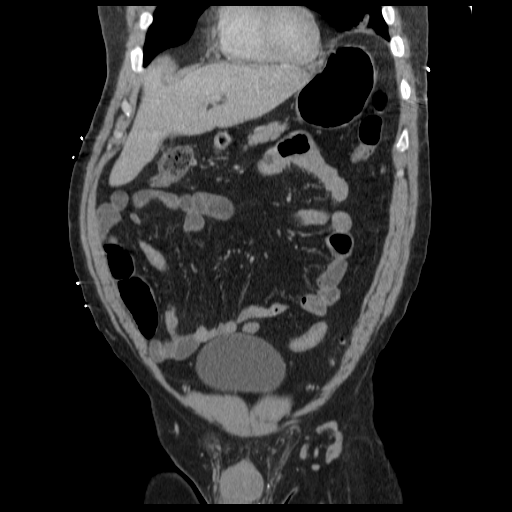
[im 47/105  soft-tissue]
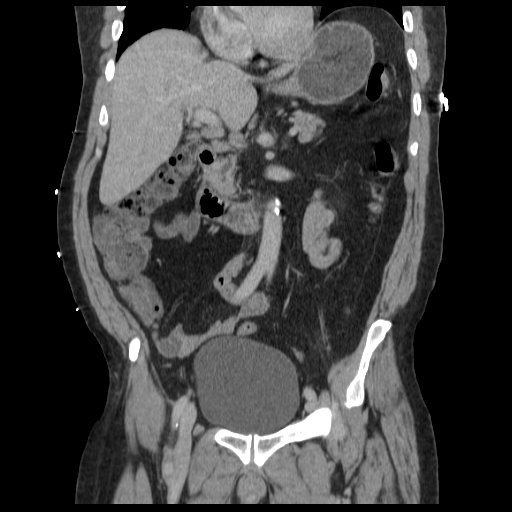
[im 58/105  soft-tissue]
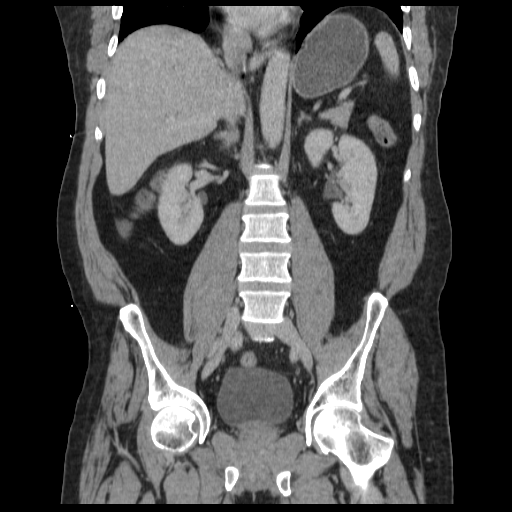

[17 of 46 positions shown; findings below may reference images not displayed]

FINDINGS: There is bibasilar atelectasis.  No effusions.  Heart is
upper limits normal in size.

The patient is status post cholecystectomy.  Minimal stranding
noted in the gallbladder fossa, decreased since prior study.  No
focal fluid collection to suggest abscess.

Tiny low density areas scattered within the kidneys compatible with
small cysts.  Liver, spleen, pancreas, adrenals unremarkable. Bowel
grossly unremarkable.  No free fluid, free air, or adenopathy.
Aorta is normal caliber.
IMPRESSION: Status post cholecystectomy.  No complicating feature.

Bibasilar atelectasis.

CT PELVIS
FINDINGS: Small umbilical hernia noted containing fat.  Bladder
unremarkable.  Appendix is visualized and is normal. Bowel grossly
unremarkable.  No free fluid, free air, or adenopathy.

No acute bony abnormality.
IMPRESSION: No acute findings in the pelvis.

## 2010-08-03 ENCOUNTER — Emergency Department (HOSPITAL_COMMUNITY)
Admission: EM | Admit: 2010-08-03 | Discharge: 2010-08-03 | Payer: Self-pay | Source: Home / Self Care | Admitting: Emergency Medicine

## 2010-10-14 ENCOUNTER — Encounter (INDEPENDENT_AMBULATORY_CARE_PROVIDER_SITE_OTHER): Payer: Self-pay | Admitting: *Deleted

## 2010-10-14 ENCOUNTER — Ambulatory Visit: Payer: Self-pay | Admitting: Internal Medicine

## 2010-10-14 DIAGNOSIS — R143 Flatulence: Secondary | ICD-10-CM

## 2010-10-14 DIAGNOSIS — R142 Eructation: Secondary | ICD-10-CM

## 2010-10-14 DIAGNOSIS — R197 Diarrhea, unspecified: Secondary | ICD-10-CM

## 2010-10-14 DIAGNOSIS — Z86718 Personal history of other venous thrombosis and embolism: Secondary | ICD-10-CM | POA: Insufficient documentation

## 2010-10-14 DIAGNOSIS — R141 Gas pain: Secondary | ICD-10-CM

## 2010-10-15 ENCOUNTER — Encounter: Payer: Self-pay | Admitting: Internal Medicine

## 2010-10-17 ENCOUNTER — Telehealth: Payer: Self-pay | Admitting: Internal Medicine

## 2010-10-17 ENCOUNTER — Encounter (INDEPENDENT_AMBULATORY_CARE_PROVIDER_SITE_OTHER): Payer: Self-pay | Admitting: *Deleted

## 2010-10-20 IMAGING — CR DG ABDOMEN ACUTE W/ 1V CHEST
3 series · 3 of 3 positions shown · non-contrast
Comparison: 03/07/2009 and earlier.

CLINICAL DATA: 48-year-old male with abdominal pain.

ACUTE ABDOMEN SERIES (ABDOMEN 2 VIEW & CHEST 1 VIEW)

[w chest pa]
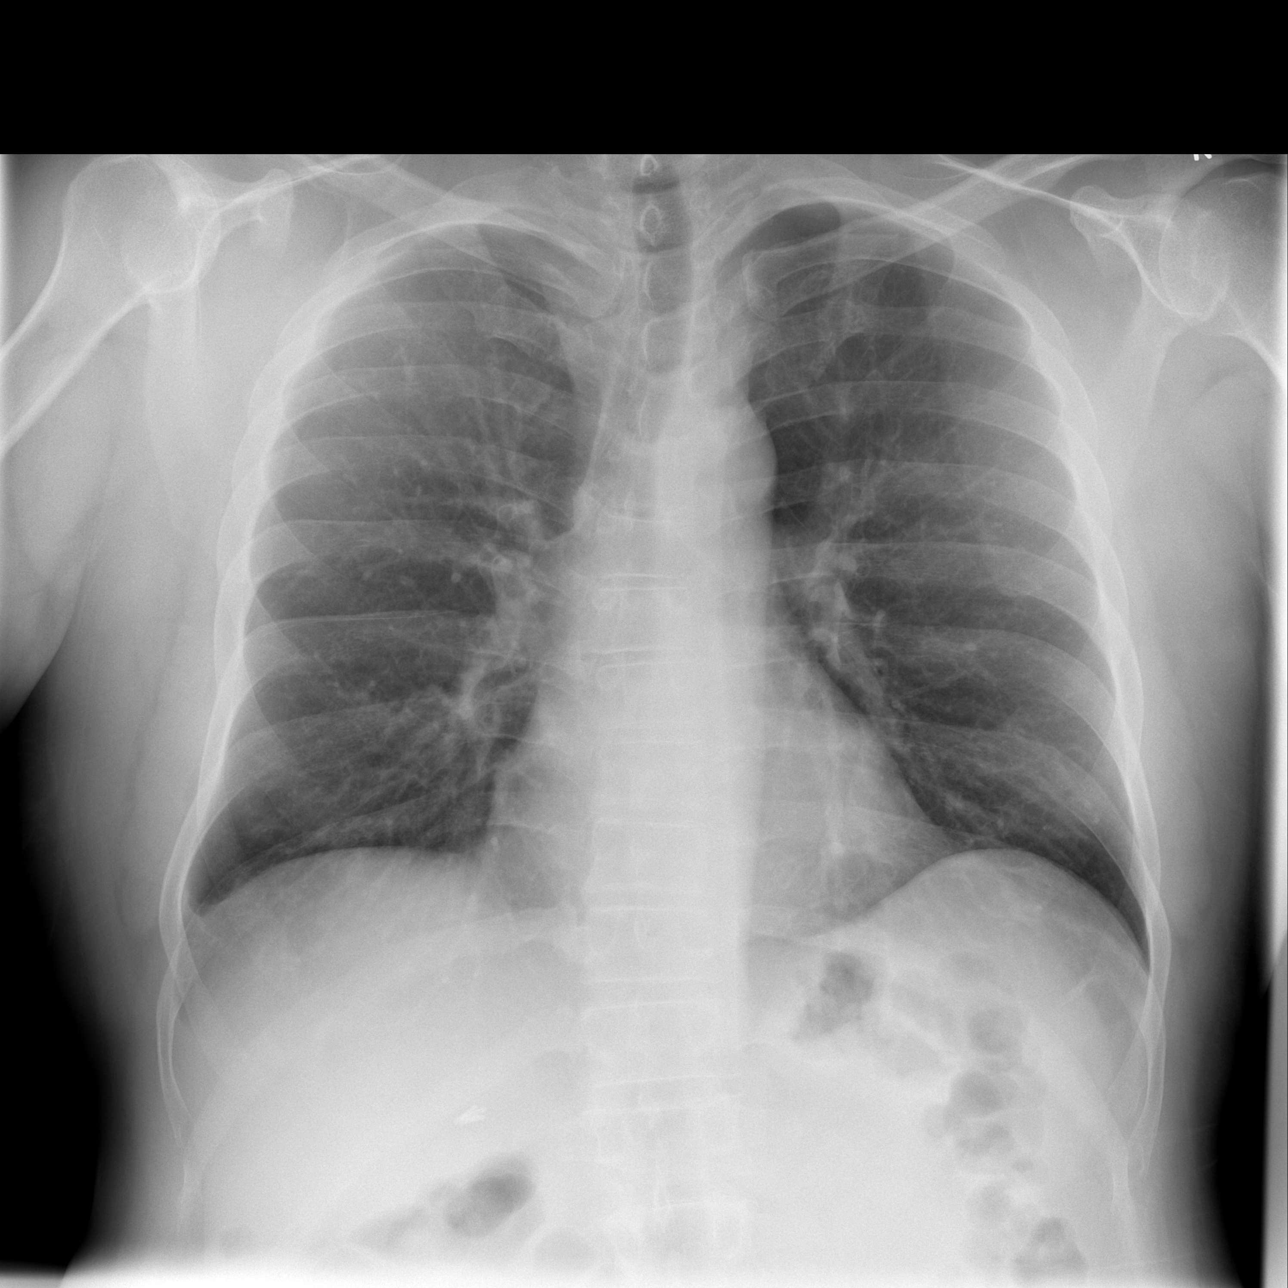

[w abdomen upright *]
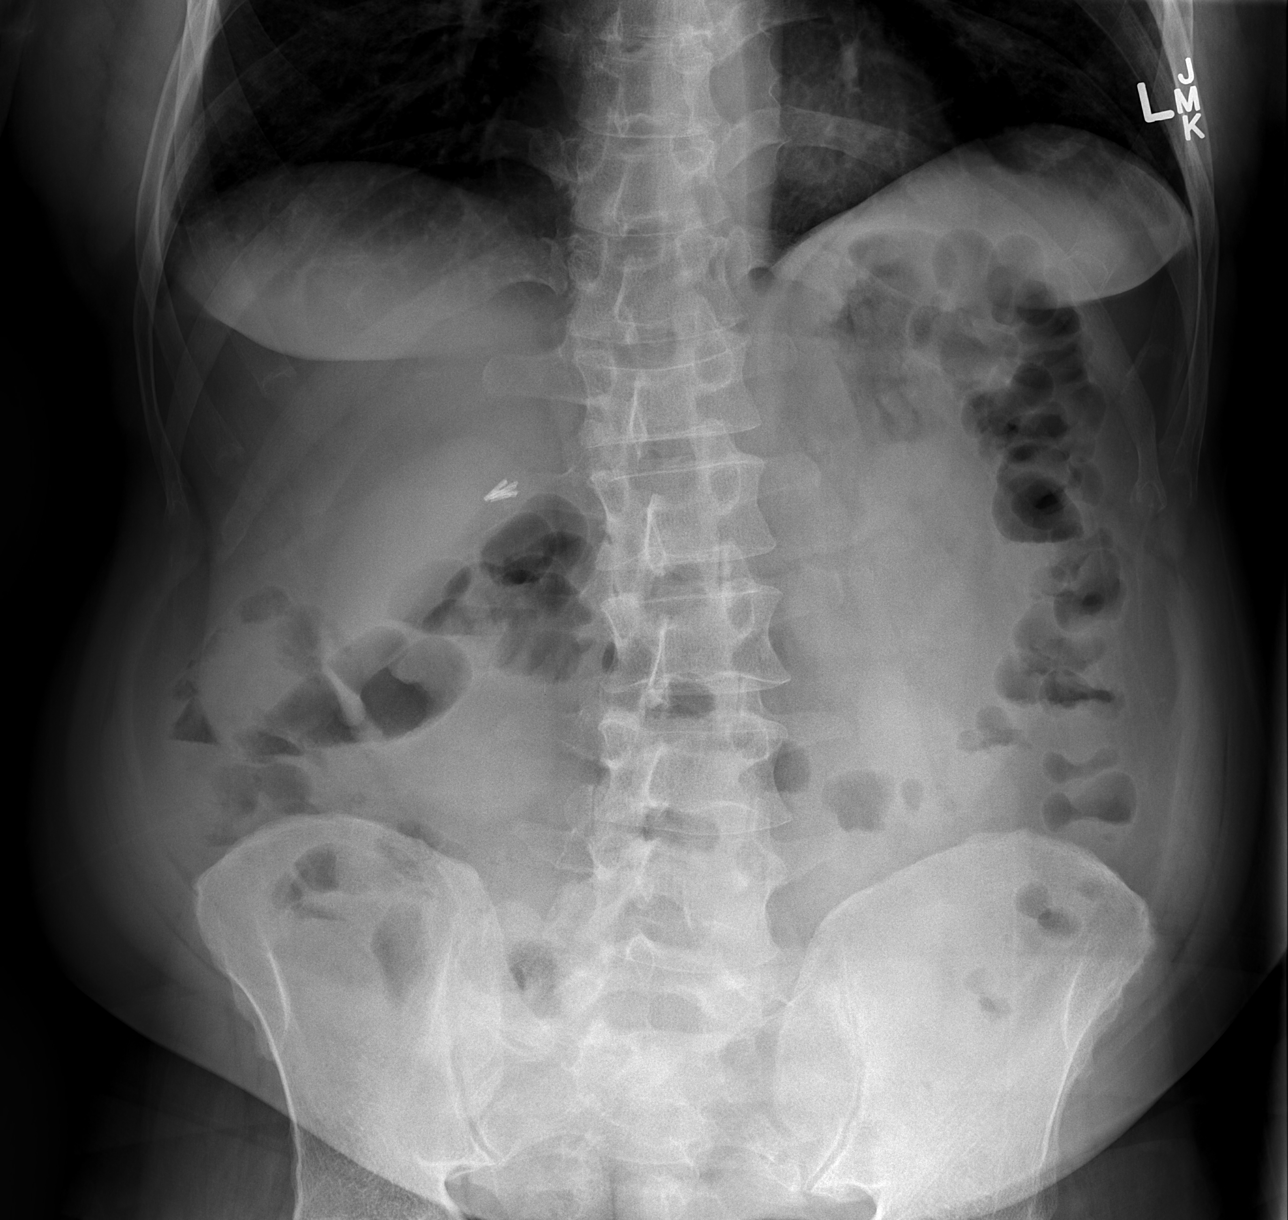

[t abdomen supine]
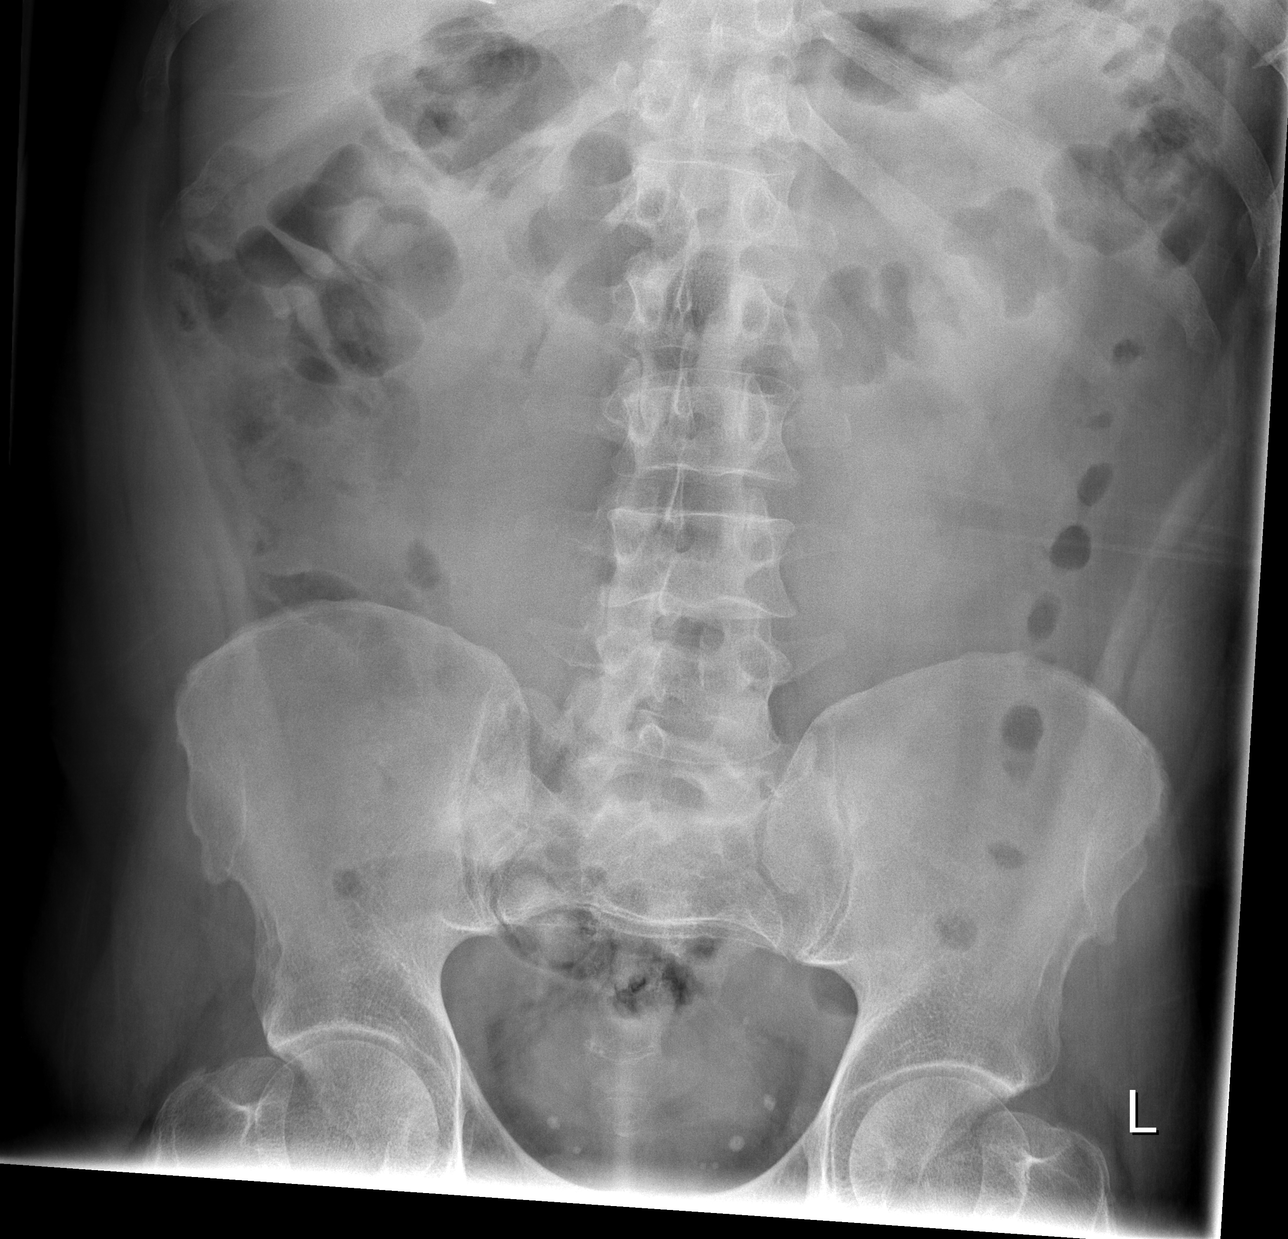

[3 of 3 positions shown; findings below may reference images not displayed]

FINDINGS: Cardiac size and mediastinal contours are within normal
limits.  Slightly shallow lung volumes.  No pneumothorax or
pneumoperitoneum.  The lungs are clear.

Surgical clips in the right upper quadrant. Nonobstructed bowel gas
pattern. Visceral contours are within normal limits. Stable
visualized osseous structures.  Pelvic phleboliths.
IMPRESSION: 1. Nonobstructed bowel gas pattern, no free air.
2. No acute cardiopulmonary abnormality.

## 2010-11-14 ENCOUNTER — Ambulatory Visit: Admit: 2010-11-14 | Payer: Self-pay | Admitting: Internal Medicine

## 2010-11-15 ENCOUNTER — Encounter (INDEPENDENT_AMBULATORY_CARE_PROVIDER_SITE_OTHER): Payer: Self-pay | Admitting: *Deleted

## 2010-11-18 ENCOUNTER — Encounter: Payer: Self-pay | Admitting: Internal Medicine

## 2010-11-18 ENCOUNTER — Ambulatory Visit
Admission: RE | Admit: 2010-11-18 | Discharge: 2010-11-18 | Payer: Self-pay | Source: Home / Self Care | Attending: Internal Medicine | Admitting: Internal Medicine

## 2010-11-20 IMAGING — CR DG ABDOMEN 1V
2 series · 2 of 2 positions shown · non-contrast
Comparison: Abdomen film of 05/28/2009

CLINICAL DATA: Dental instrument broke in patient's mouth

ABDOMEN - 1 VIEW

[t abdomen supine (1 of 2)]
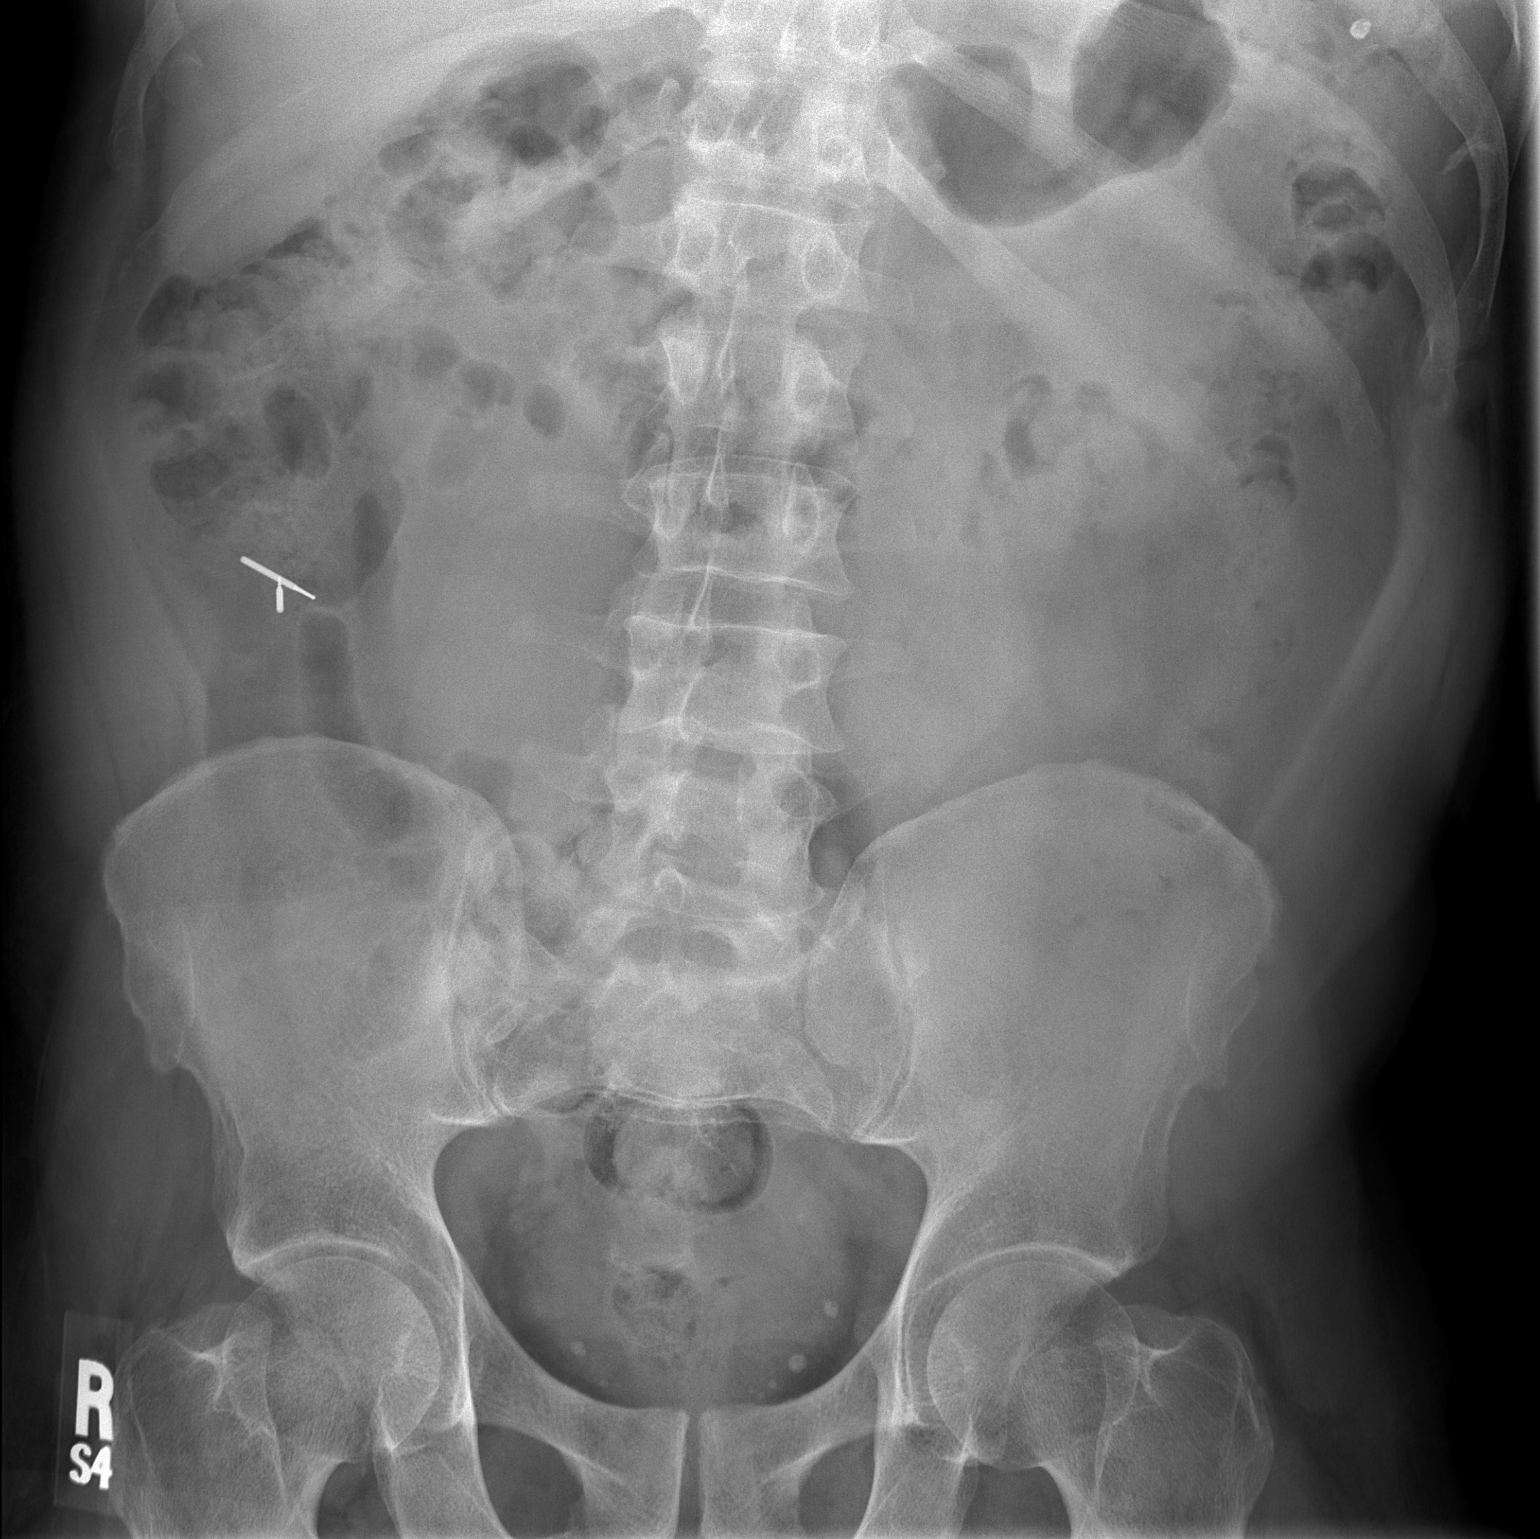

[t abdomen supine (2 of 2)]
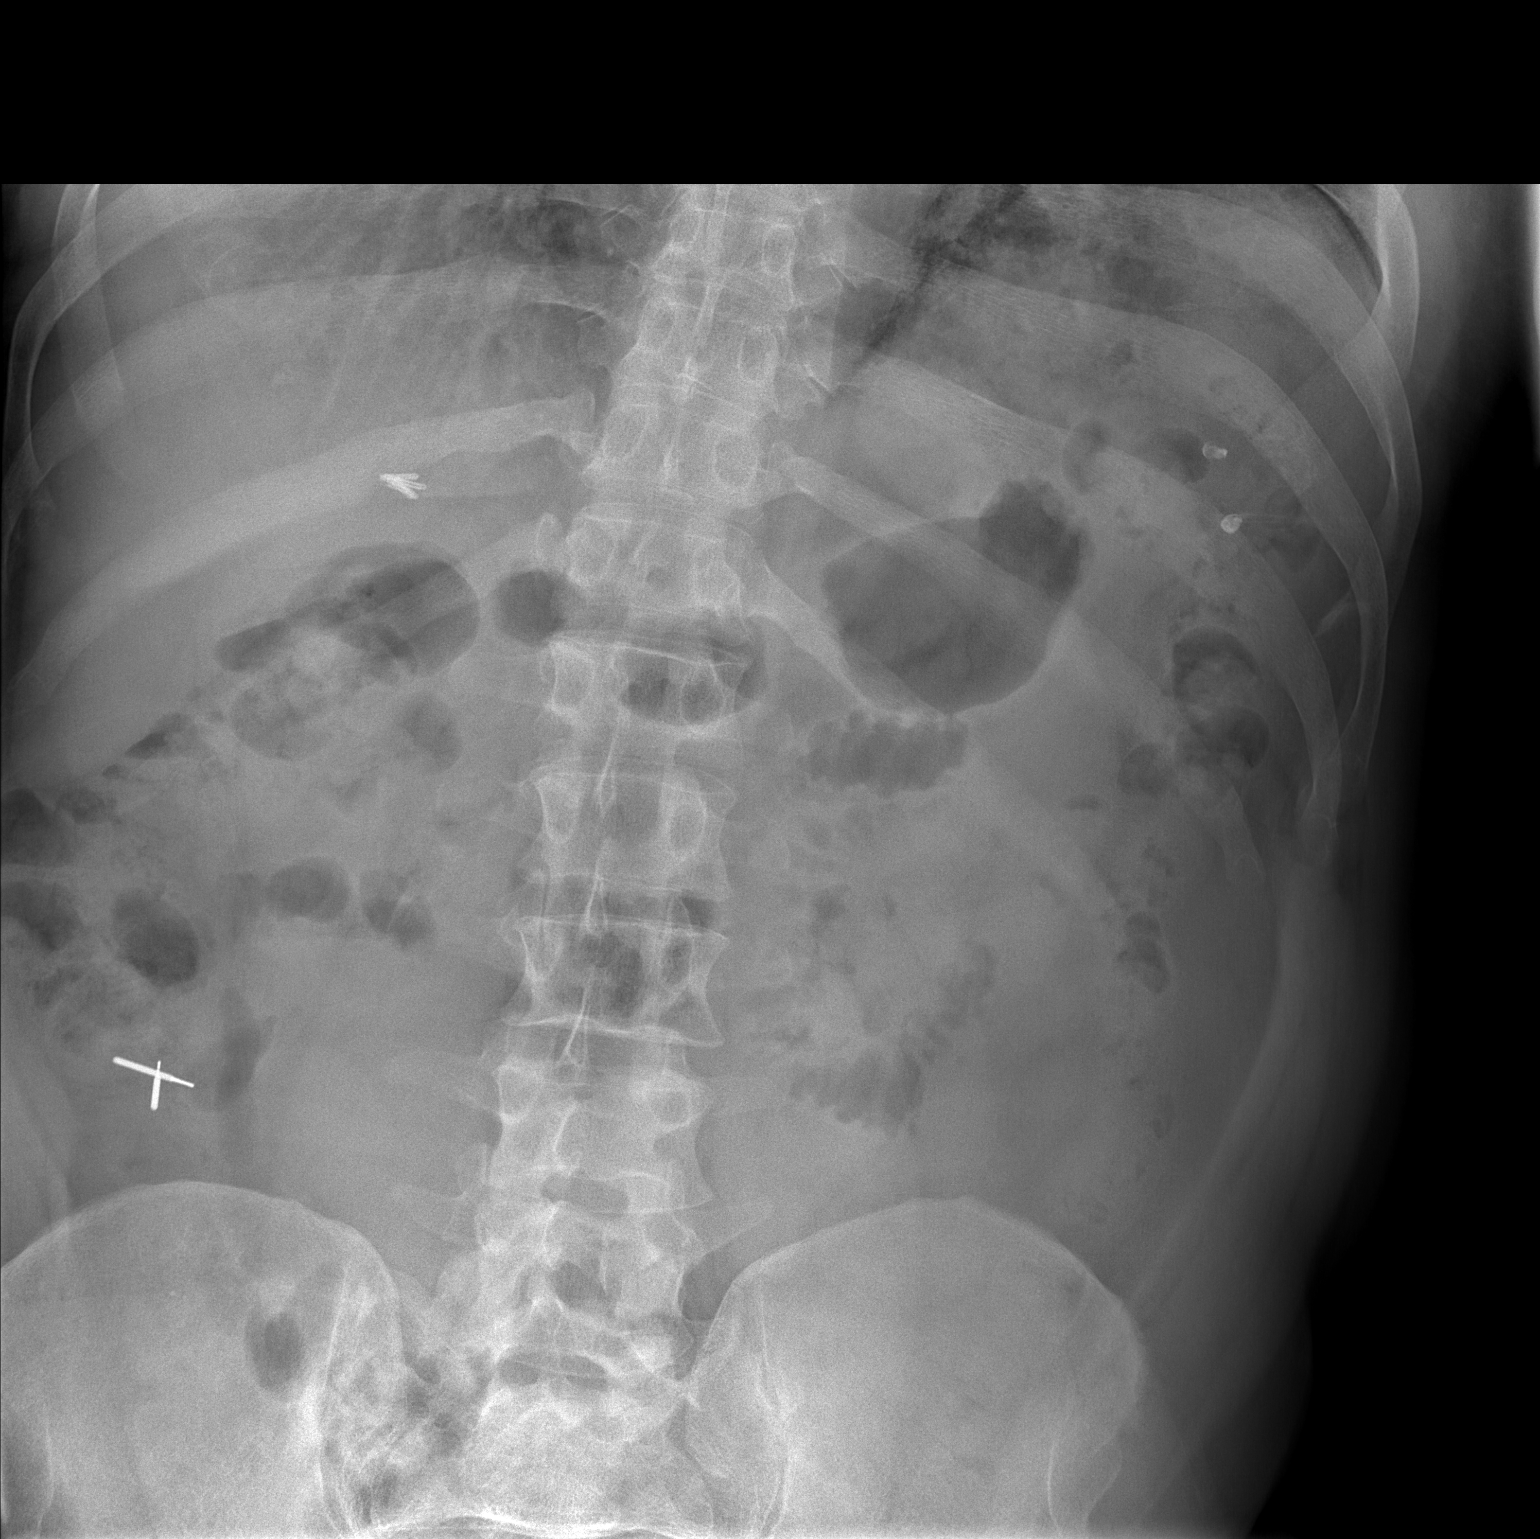

[2 of 2 positions shown; findings below may reference images not displayed]

FINDINGS: Two linear metallic foreign bodies are noted overlying
each other within the right abdomen.  No bowel obstruction is seen.
Surgical clips are present in the right upper quadrant from prior
cholecystectomy.  No bony abnormality is seen.
IMPRESSION: Two linear metallic foreign bodies overlie each other in the right
abdomen.  The bowel gas pattern is nonspecific.

## 2010-11-22 ENCOUNTER — Ambulatory Visit: Admit: 2010-11-22 | Payer: Self-pay | Admitting: Gastroenterology

## 2010-11-22 ENCOUNTER — Encounter: Payer: Self-pay | Admitting: Internal Medicine

## 2010-11-22 ENCOUNTER — Ambulatory Visit (HOSPITAL_COMMUNITY)
Admission: RE | Admit: 2010-11-22 | Discharge: 2010-11-22 | Payer: Self-pay | Source: Home / Self Care | Attending: Internal Medicine | Admitting: Internal Medicine

## 2010-11-25 ENCOUNTER — Encounter: Payer: Self-pay | Admitting: Internal Medicine

## 2010-11-30 ENCOUNTER — Encounter: Payer: Self-pay | Admitting: Neurology

## 2010-12-01 ENCOUNTER — Encounter: Payer: Self-pay | Admitting: Neurology

## 2010-12-09 ENCOUNTER — Other Ambulatory Visit: Payer: Self-pay | Admitting: Psychiatry

## 2010-12-09 DIAGNOSIS — G35 Multiple sclerosis: Secondary | ICD-10-CM

## 2010-12-09 IMAGING — MR MR MRA NECK W/O CM
8 of 10 series · 27 of 48 positions shown · non-contrast
Comparison: none

[Series 4: T1 · sagittal · 5.0mm · 0.45mm/px · 3 of 20 slices shown]
[im 1/20]
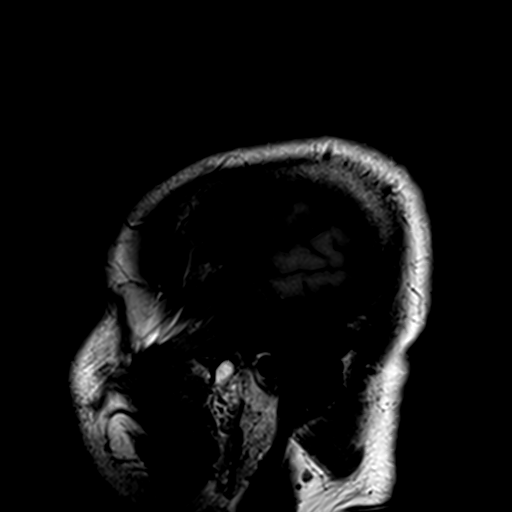
[im 10/20]
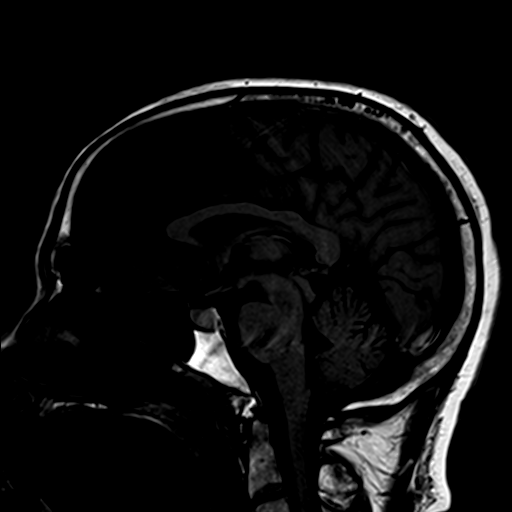
[im 20/20]
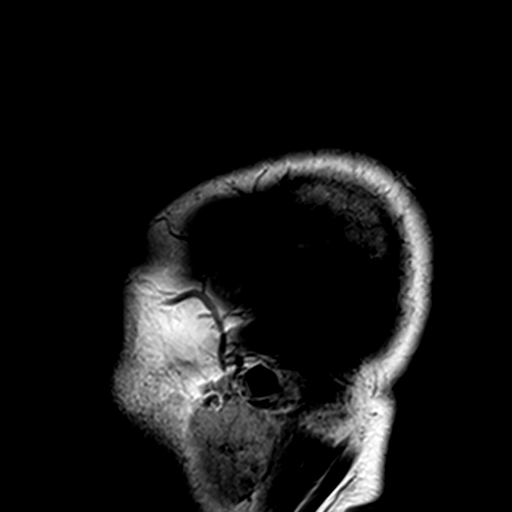

[Series 5: tof_3d_multi-slab · axial · 0.7mm · 0.35mm/px · z∈[-29,+41]mm · 8 of 104 slices shown]
[im 1/104]
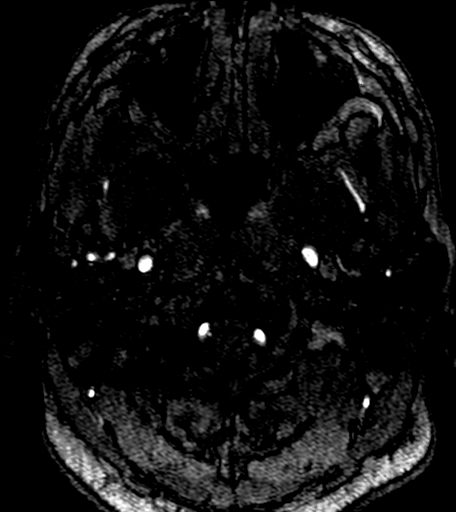
[im 21/104]
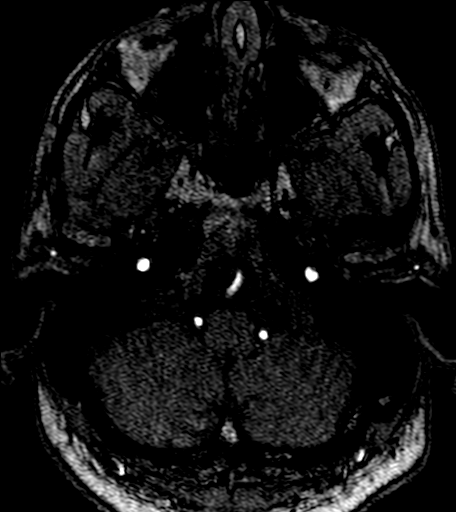
[im 31/104]
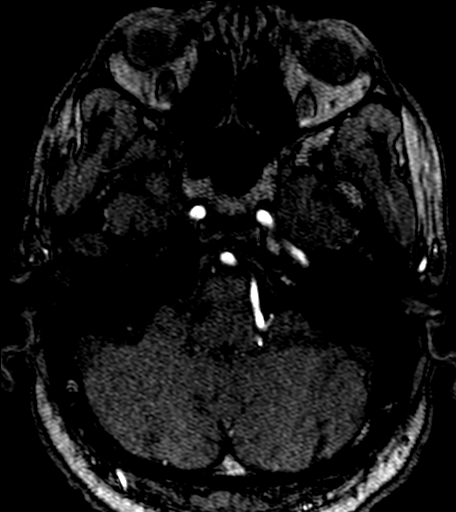
[im 42/104]
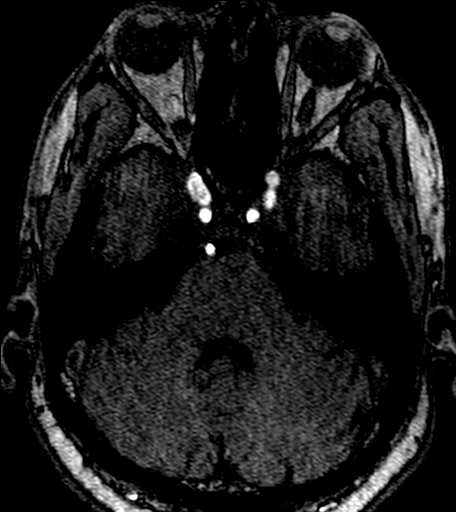
[im 62/104]
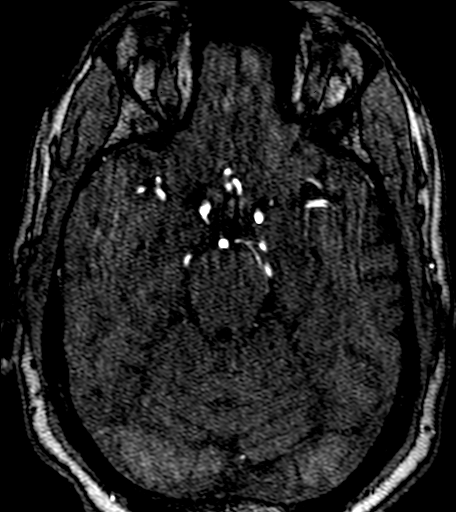
[im 73/104]
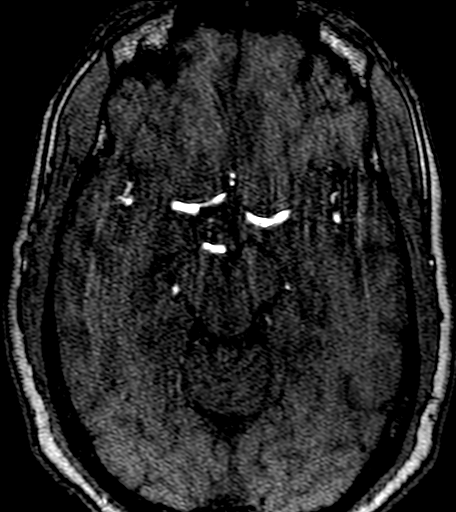
[im 83/104]
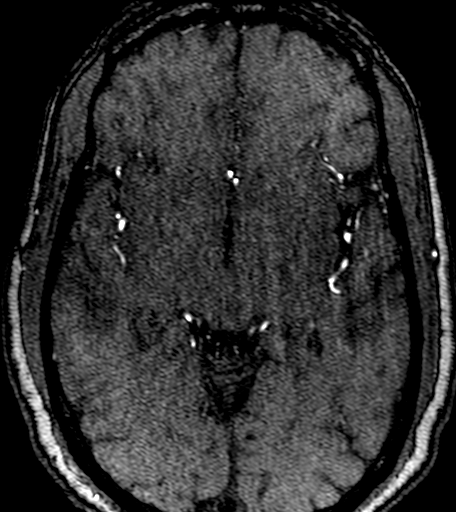
[im 104/104]
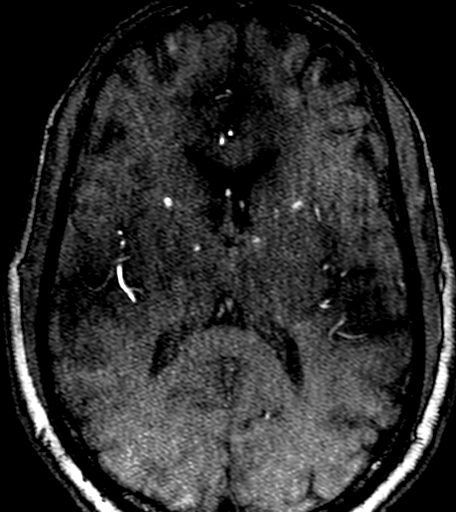

[Series 9: DWI · axial · 5.0mm · 0.90mm/px · z∈[-54,+82]mm · 5 of 44 slices shown]
[im 1/44]
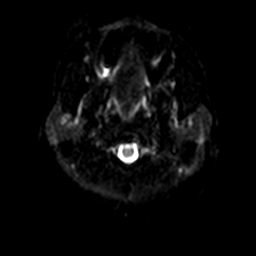
[im 11/44]
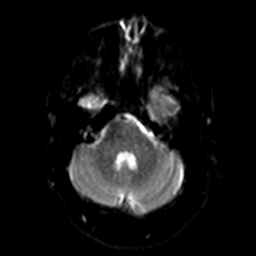
[im 22/44]
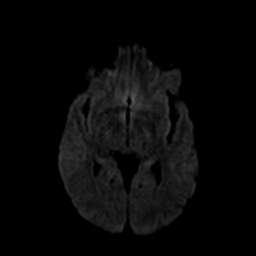
[im 33/44]
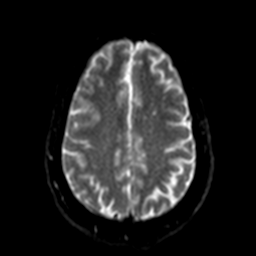
[im 44/44]
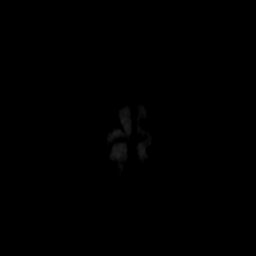

[Series 10: dwi_adc · axial · 5.0mm · 0.90mm/px · z∈[-54,+82]mm · 2 of 22 slices shown]
[im 1/22]
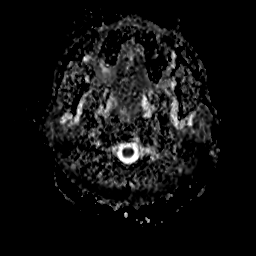
[im 22/22]
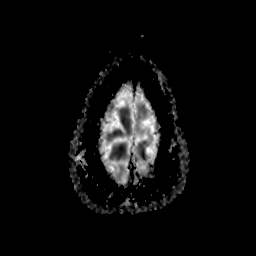

[Series 12: T2 · axial · 5.0mm · 0.45mm/px · z∈[-54,+83]mm · 2 of 22 slices shown (1 of 2)]
[im 1/22]
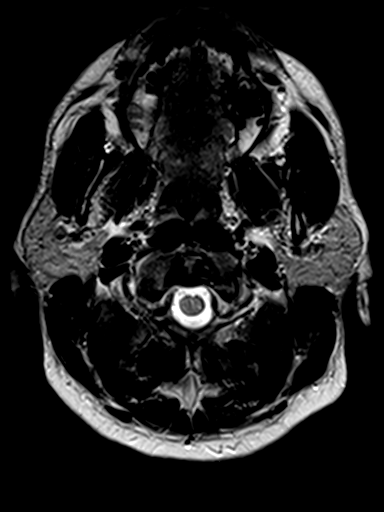
[im 22/22]
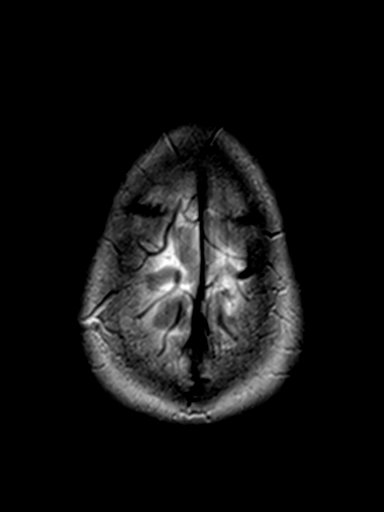

[Series 13: FLAIR · axial · 5.0mm · 0.45mm/px · z∈[-54,+82]mm · 2 of 22 slices shown]
[im 1/22]
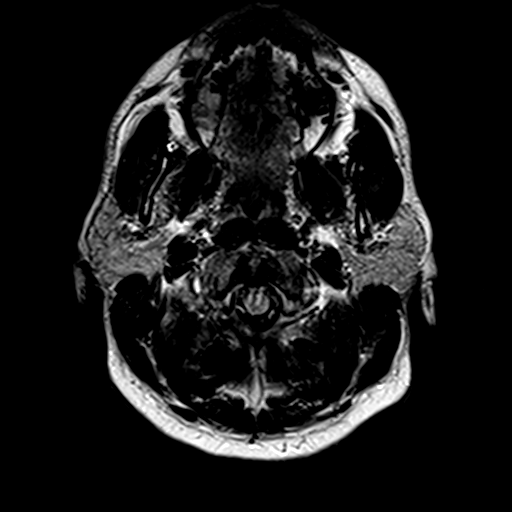
[im 22/22]
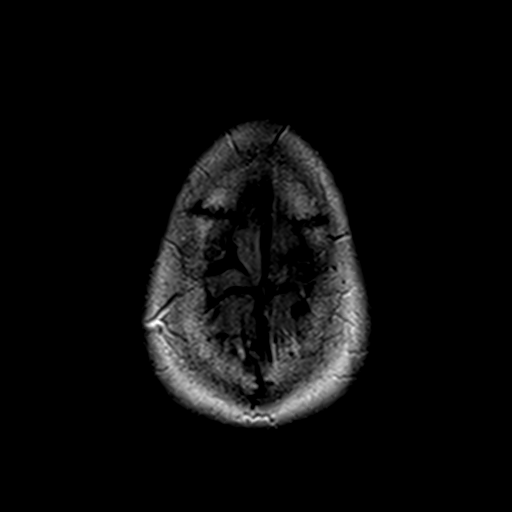

[Series 14: axial grad (blood) · axial · 5.0mm · 0.45mm/px · z∈[-60,+89]mm · 2 of 24 slices shown]
[im 1/24]
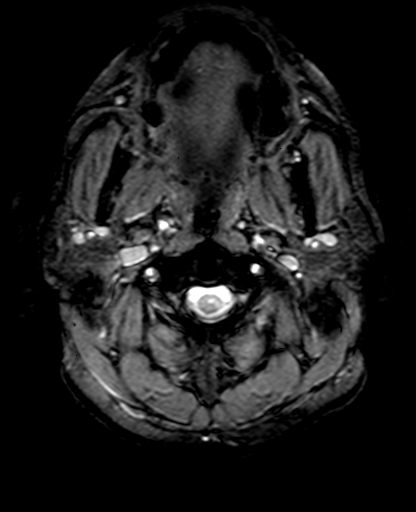
[im 24/24]
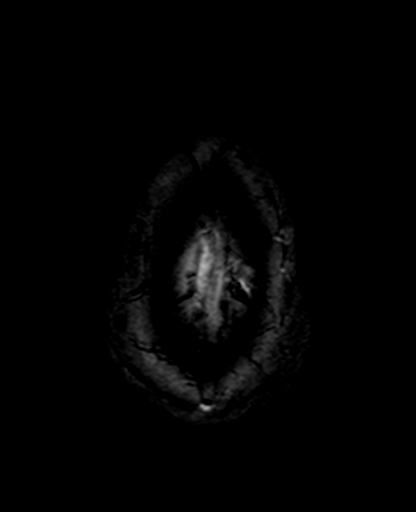

[Series 16: T2 · coronal · 5.0mm · 0.45mm/px · 3 of 25 slices shown (2 of 2)]
[im 1/25]
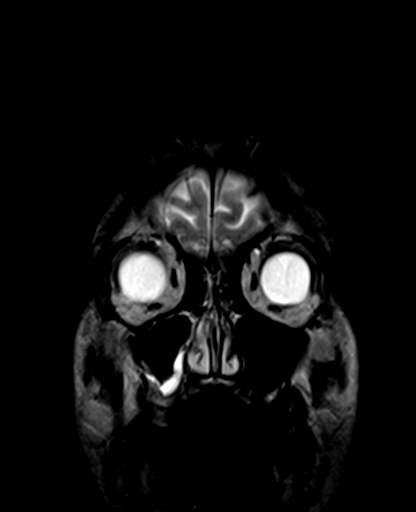
[im 13/25]
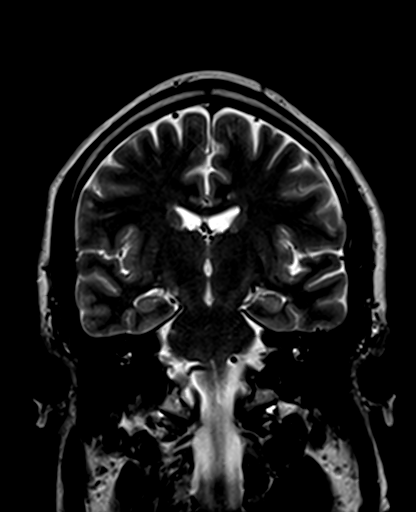
[im 25/25]
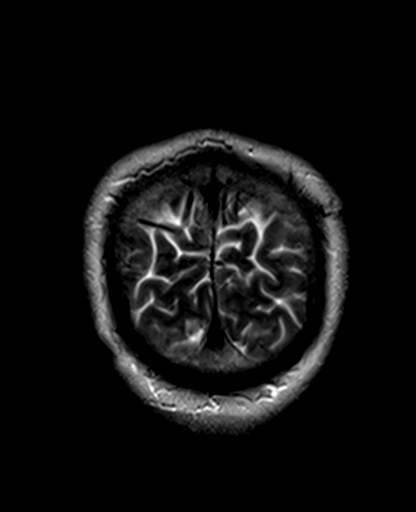

[27 of 48 positions shown; findings below may reference images not displayed]

This examination was performed at [HOSPITAL] at [[facility]OSPITAL]. The interpretation will be provided by [REDACTED]

## 2010-12-10 NOTE — Progress Notes (Signed)
Summary: Colonoscopy  Phone Note Call from Patient Call back at Home Phone [phone] Call back at [phone]   Caller: Patient Call For: Dr Leone Payor Reason for Call: Talk to Nurse Details for Reason: Colonoscopy Summary of Call: Pt was calling to determine when he will be scheduled for his pocedure; Stated the nurse was supposed to call him within a week. Initial call taken by: Dwan Bolt,  October 17, 2010 12:35 PM  Follow-up for Phone Call        Response from Dr. Concepcion Elk states OK to hold coumadin 3 days prior.  No mention of lovenox bridge.  Dr. Leone Payor, OK to schedule this pt?  Does he need anything different than normal protocol? Francee Piccolo CMA [facility]ull)  October 17, 2010 2:41 PM   Additional Follow-up for Phone Call Additional follow up Details #1::        schedule for colonoscopy and hold Coumadin 3 days before he has MS and has walking difficulty so schedule him at hospital during my january week, please Additional Follow-up by: Iva Boop MD, Clementeen Graham,  October 17, 2010 3:02 PM    Additional Follow-up for Phone Call Additional follow up Details #2::    Pt notified and is scheduled for colon @ [facility] on 11/19/10, previsit on 11/12/10. Follow-up by: Francee Piccolo CMA [facility]),  October 17, 2010 4:26 PM

## 2010-12-10 NOTE — Assessment & Plan Note (Signed)
Summary: abd bloating--ch.   History of Present Illness Visit Type: Initial Visit Primary GI MD: Stan Head MD [facility] Primary Provider: Evelina Bucy, MD Chief Complaint: Abdominal distension History of Present Illness:   Patient complains that since he had his gallbladder removed in May 2010 he has constiant abdominal distenstion. HE complains that he has urgency with his bowel movements after he eats.   Loose and urgent defecation since cholecystectomy. also bloating frequently if not constantly since then. He has not gained weight. He started [place] a few months before cholecystectomy but no other medication changes. Quite bothered by the symptoms, particularly the bloating. Uses a motorized wheelchair but can ambulae some (bilateral foot drop problems)       GI Review of Systems    Reports bloating.      Denies abdominal pain, acid reflux, belching, chest pain, dysphagia with liquids, dysphagia with solids, heartburn, loss of appetite, nausea, vomiting, vomiting blood, weight loss, and  weight gain.      Reports change in bowel habits.     Denies anal fissure, black tarry stools, constipation, diarrhea, diverticulosis, fecal incontinence, heme positive stool, hemorrhoids, irritable bowel syndrome, jaundice, light color stool, liver problems, rectal bleeding, and  rectal pain. Preventive Screening-Counseling & Management      Drug Use:  no.      Current Medications (verified): 1)  Gabapentin 600 Mg Tabs (Gabapentin) .... Take One By Mouth Three Times A Day 2)  Tysabri 300 Mg/52ml Conc (Natalizumab) .... Infuse One Once A Month 3)  Lunesta 3 Mg Tabs (Eszopiclone) .... Take One By Mouth At Bedtime 4)  Percocet 5-325 Mg Tabs (Oxycodone-Acetaminophen) .... Take One By Mouth Two Times A Day As Needed 5)  Oxycodone Hcl 20 Mg Tabs (Oxycodone Hcl) .... Take One By Mouth Two Times A Day As Needed 6)  Baclofen 20 Mg Tabs (Baclofen) .... Take One By Mouth Three Times A Day 7)   Warfarin Sodium 5 Mg Tabs (Warfarin Sodium) .... Take 3/4 Tablet By Mouth Once Daily 8)  Ampyra 10 Mg Xr12h-Tab (Dalfampridine) .... Take One By Mouth Two Times A Day 9)  Zoloft 50 Mg Tabs (Sertraline Hcl) .... Take One By Mouth Once Daily  Allergies (verified): No Known Drug Allergies  Past History:  Past Medical History: Reviewed history from 10/11/2010 and no changes required. Multiple Sclerosis Degenerative Joint Disease Chronic Pain Syndrome Depression Hypertension Pulmonary Embolism-2005, 2010 Gallstones Hx Cocaine and Marijuana use  Family History: Family History of Diabetes: daugher No FH of Colon Cancer: Father had leukemia  Social History: Patient currently smokes. 1/3 PPD Occupation: truck Hospital doctor retired Alcohol Use - yes weekend Daily Caffeine Use every other day in the winter Illicit Drug Use - no Drug Use:  no  Review of Systems       The patient complains of arthritis/joint pain, back pain, change in vision, heart murmur, muscle pains/cramps, and sleeping problems.         All other ROS negative except as per HPI.   Vital Signs:  Patient profile:   50 year old male Height:      68 inches Weight:      201.8 pounds BMI:     30.79 Pulse rate:   74 / minute Pulse rhythm:   regular BP sitting:   126 / 66  (left arm) Cuff size:   regular  Vitals Entered By: Harlow Mares CMA Duncan Dull) (October 14, 2010 9:55 AM)  Physical Exam  General:  Well developed, well nourished,  no acute distress. In wheelchair Eyes:  anicteric, PEERLA Mouth:  No deformity or lesions, dentition normal. Neck:  supple, no mass Lungs:  Clear throughout to auscultation. Heart:  Regular rate and rhythm; no murmurs, rubs,  or bruits. Abdomen:  Soft, nontender and nondistended. No masses, hepatosplenomegaly or hernias noted. Normal bowel sounds. lap chole scars Rectal:  deferred until time of colonoscopy.   Extremities:  no edema Neurologic:  Alert and  oriented x4; waekness and  foot drop (bilat) in LE's able to ambulate some but has to lift feet at times and to get onto exam table Cervical Nodes:  No significant cervical or supraclavicular adenopathy.  Psych:  Alert and cooperative. Normal mood and affect.   Impression & Recommendations:  Problem # 1:  ABDOMINAL BLOATING (ICD-787.3) Assessment New unusual post-chole ? if it is from T[place] see what happens with Colestid  Problem # 2:  LOOSE STOOLS (ICD-787.91) Assessment: New post-chole so will try Colestid if that works have answer if not then diagnostic colonoscopy still will need a screening colonoscopy  Problem # 3:  COUMADIN THERAPY (ICD-V58.61) Assessment: New need to sort out if he can hold warfarin 3 days vs. Lovenox bridge for colonoscopy This raises risks and complicates his necessary evaluations  Problem # 4:  PULMONARY EMBOLISM, HX OF (ICD-V12.51) Assessment: New  Patient Instructions: 1)  Please pick up your medications at your pharmacy. COLESTIPOL 2)  Be sure to take this with LUNCH. 3)  We will call you to schedule your Colonoscopy when we have heard from Dr. Concepcion Elk regarding your warfarin.  If you have not heard from [place] in one week, please call our office. 4)  Copy sent to : Fleet Contras, MD 5)  The medication list was reviewed and reconciled.  All changed / newly prescribed medications were explained.  A complete medication list was provided to the patient / caregiver. Prescriptions: COLESTIPOL HCL 1 GM TABS (COLESTIPOL HCL) 2 by mouth with lunch  #60 x 1   Entered and Authorized by:   Iva Boop MD, [facility]   Signed by:   Iva Boop MD, FACG on 10/14/2010   Method used:   [facility]  [address] [phone][facility] ([facility]ail[facility]       [address]       [place], [place]  [id number]       Ph: [phone] or [phone]       Fax: [phone]   RxID:   [id number]

## 2010-12-10 NOTE — Consult Note (Signed)
Summary: Cholidocholithiasis  NAME:  [name], [name] NO.:  [id number]      MEDICAL RECORD NO.:  [id number]          PATIENT TYPE:  INP      LOCATION:  5122                         FACILITY:  [facility]      PHYSICIAN:  Iva Boop, MD,FACGDATE OF BIRTH:  03-11-1961      DATE OF CONSULTATION:  02/21/2009   DATE OF DISCHARGE:                                    CONSULTATION      REQUESTING PHYSICIAN:  Velora Heckler, MD      REASON FOR CONSULTATION:  Choledocholithiasis suspected on   intraoperative cholangiogram.      ASSESSMENT:  A 50 year old [place] American man with multiple sclerosis   who had a laparoscopic cholecystectomy yesterday.  The intraoperative   cholangiogram (self-viewed) does not drain and there is a suggestion of   a distal common bile duct stone.      RECOMMENDATIONS AND PLAN:  Endoscopic retrograde   cholangiopancreatography with likely sphincterotomy and stone   extraction.  The risks, benefits, and indications have been reviewed   with the patient.  We will continue his Unasyn.      HISTORY:  A 50 year old man as described above.  He had had some   abdominal bloating and pain for about two and a half weeks.  On   Thursday, he had right upper quadrant pain.  He was unable to eat well   after that, had a temperature of 100.8 when he was admitted to the   hospital on February 17, 2009, in the emergency department.  Found to have   gallstones, and HIDA scan with delayed filling of the gallbladder and a   laparoscopic cholecystectomy was carried out yesterday.  Findings as   above.  He has some postoperative pain at this time.  He is hungry.   Morphine has been controlling his postoperative pain at this time.  He   has not been having any nausea, vomiting, or diarrhea.      PAST MEDICAL HISTORY:  Multiple sclerosis, hypertension, depression,   pulmonary embolism in 2005, osteoarthritis, gastroesophageal reflux   disease, pneumonia, L1 compression  fracture, prior tonsillectomy, left   knee arthroscopy.      MEDICATIONS:  IV Unasyn, baclofen, Neurontin, Prinivil, Skelaxin,   Pamelor, Protonix, Zoloft, and Vicodin.      ALLERGIES:  TIZANIDINE.      FAMILY HISTORY:  No gallbladder disease or colon cancer.  Father died at   age 38 from leukemia.      REVIEW OF SYSTEMS:  Left-sided weakness from his multiple sclerosis.   His last flare that was about 2 years ago.  His mood has been stable.   Positive for urinary hesitancy.  All other review of systems reviewed   and negative.      PHYSICAL EXAMINATION:  GENERAL:  Well-developed, robust, African   American man in no acute distress.   VITAL SIGNS:  Temperature 98.3, pulse 101, respirations 16, blood   pressure 132/75.   HEENT:  The eyes are anicteric.  Conjunctivae are without pallor.  Mouth  is clear, moist.  Good teeth and dentition.   NECK:  No jugular venous distention in the neck.  No mass.   LUNGS:  Clear bilaterally.  No rales or wheezes.   HEART:  S1 and S2.  No murmurs or gallops.   ABDOMEN:  Soft, appropriately tender around his laparoscopic incisions.   Bowel sounds present.  No masses.   EXTREMITIES:  Free of edema, cyanosis, or clubbing.  Pedal pulses are   full.   NEUROLOGIC:  He is alert and oriented x3.  No tremor.   PSYCH:  Appropriate mood and affect.      LABORATORY DATA:  White count 7.1, hemoglobin 13.1, platelets 167.   Coags were normal.  LFTs normal on February 19, 2009, initially elevated   transaminases, which normalized.  BMET normal.  UA negative.  Ultrasound   showed gallstones.  HIDA scan as above.  Intraoperative cholangiogram as   above and self-viewed as well.  Old chart reviewed as well, i.e.   hospital chart.      Hospital chart/old records reviewed.   I appreciate the opportunity in the care for this patient.               Iva Boop, MD,FACG   Electronically Signed            CEG/MEDQ  D:  02/21/2009  T:  02/22/2009  Job:   [id number]      cc:   Velora Heckler, MD

## 2010-12-10 NOTE — Letter (Signed)
Summary: Pre Visit Letter Revised  [facility]  [address] [place], [place] [address]   Phone: [phone]  Fax: [phone]        10/17/2010 MRN: [id number] [name] [address] [place], [place]  [address]             Procedure Date:  November 19, 2010  Welcome to the Gastroenterology Division at [facility].    You are scheduled to see a nurse for your pre-procedure visit on November 12, 2010 at 1:00 pm on the 3rd floor at [facility], [address]  We ask that you try to arrive at our office 15 minutes prior to your appointment time to allow for check-in.  Please take a minute to review the attached form.  If you answer "Yes" to one or more of the questions on the first page, we ask that you call the person listed at your earliest opportunity.  If you answer "No" to all of the questions, please complete the rest of the form and bring it to your appointment.    Your nurse visit will consist of discussing your medical and surgical history, your immediate family medical history, and your medications.   If you are unable to list all of your medications on the form, please bring the medication bottles to your appointment and we will list them.  We will need to be aware of both prescribed and over the counter drugs.  We will need to know exact dosage information as well.    Please be prepared to read and sign documents such as consent forms, a financial agreement, and acknowledgement forms.  If necessary, and with your consent, a friend or relative is welcome to sit-in on the nurse visit with you.  Please bring your insurance card so that we may make a copy of it.  If your insurance requires a referral to see a specialist, please bring your referral form from your primary care physician.  No co-pay is required for this nurse visit.     If you cannot keep your appointment, please call [phone] to cancel or reschedule prior to your appointment date.   This allows [place] the opportunity to schedule an appointment for another patient in need of care.    Thank you for choosing [facility] for your medical needs.  We appreciate the opportunity to care for you.  Please visit [place] at our website  to learn more about our practice.  Sincerely, The Gastroenterology Division

## 2010-12-10 NOTE — Letter (Signed)
Summary: Anticoagulation Modification Letter  [facility]  [address] [place], [place] [address]   Phone: [phone]  Fax: [phone]    October 14, 2010  Re:    [name] DOB:    1961-04-30 MRN:    [id number]    Dear Dr. Concepcion Elk:  We are scheduling the above patient for an endoscopic procedure. Our records show that  he is on anticoagulation therapy. Please advise as to how long the patient may come off their therapy of Coumadin prior to any scheduled procedure.  Please fax the completed form to Francee Piccolo, CMA (AAMA) at [phone].  Thank you for your help with this matter.  Sincerely,  Francee Piccolo CMA Duncan Dull)   Physician Recommendation:  Hold Coumadin 3 days prior ____________  Other ______________________________     Appended Document: Anticoagulation Modification Letter Per Dr. Concepcion Elk, OK to hold coumadin 3 days prior to procedure. Hardcopy sent to Medical Records to be scanned in to EMR.

## 2010-12-12 ENCOUNTER — Ambulatory Visit
Admission: RE | Admit: 2010-12-12 | Discharge: 2010-12-12 | Disposition: A | Discharge status: Home / Self Care | Payer: Medicare Other | Source: Ambulatory Visit | Attending: Psychiatry | Admitting: Psychiatry

## 2010-12-12 DIAGNOSIS — G35 Multiple sclerosis: Secondary | ICD-10-CM

## 2010-12-12 MED ORDER — GADOBENATE DIMEGLUMINE 529 MG/ML IV SOLN: 19.0000 mL | Freq: Once | INTRAVENOUS | Status: AC | PRN

## 2010-12-12 NOTE — Letter (Signed)
**Note Gary-Identified via Obfuscation** Summary: Patient Notice- Polyp Results  [facility]  [address] [place], [place] [address]   Phone: [phone]  Fax: [phone]        November 25, 2010 MRN: [id number]    [name] [address] [place], [place]  [address]    Dear [name],  The polyp removed from your colon was adenomatous. This means that it was pre-cancerous or that  it had the potential to change into cancer over time.  I recommend that you be considered for a repeat colonoscopy in 5 years, to screen for more polyps and colorectal cancer. If you develop any new rectal bleeding, abdominal pain or significant bowel habit changes, please contact us before then.  Please call us if you are having persistent problems or have questions about your condition that have not been fully answered at this time.  Sincerely,  Iva Boop MD, [facility]  This letter has been electronically signed by your physician.  Appended Document: Patient Notice- Polyp Results letter mailed to patient's home

## 2010-12-12 NOTE — Procedures (Signed)
Summary: Preparation for Colonoscopy / [facility]  Preparation for Colonoscopy / [facility]   Imported By: Lennie Odor 11/22/2010 16:18:23  _____________________________________________________________________  External Attachment:    Type:   Image     Comment:   External Document

## 2010-12-12 NOTE — Letter (Signed)
Summary: [facility] Instructions  [facility]  [address] [place], [place] [address]   Phone: [phone]  Fax: [phone]       IZEL EISENHARDT    10/19/1961    MRN: [id number]        Procedure Day /Date:  Friday 11/22/2010     Arrival Time:  9:00 am     Procedure Time: 10:00 am     Location of Procedure:                     _ x_  [facility] ( Outpatient Registration)                        PREPARATION FOR COLONOSCOPY WITH MOVIPREP   Starting 5 days prior to your procedure Sunday 1/8 do not eat nuts, seeds, popcorn, corn, beans, peas,  salads, or any raw vegetables.  Do not take any fiber supplements (e.g. Metamucil, Citrucel, and Benefiber).  THE DAY BEFORE YOUR PROCEDURE         DATE: Thursday 1/12  1.  Drink clear liquids the entire day-NO SOLID FOOD  2.  Do not drink anything colored red or purple.  Avoid juices with pulp.  No orange juice.  3.  Drink at least 64 oz. (8 glasses) of fluid/clear liquids during the day to prevent dehydration and help the prep work efficiently.  CLEAR LIQUIDS INCLUDE: Water Jello Ice Popsicles Tea (sugar ok, no milk/cream) Powdered fruit flavored drinks Coffee (sugar ok, no milk/cream) Gatorade Juice: apple, white grape, white cranberry  Lemonade Clear bullion, consomm, broth Carbonated beverages (any kind) Strained chicken noodle soup Hard Candy                             4.  In the morning, mix first dose of MoviPrep solution:    Empty 1 Pouch A and 1 Pouch B into the disposable container    Add lukewarm drinking water to the top line of the container. Mix to dissolve    Refrigerate (mixed solution should be used within 24 hrs)  5.  Begin drinking the prep at 5:00 p.m. The MoviPrep container is divided by 4 marks.   Every 15 minutes drink the solution down to the next mark (approximately 8 oz) until the full liter is complete.   6.  Follow completed prep with 16 oz of clear liquid of your  choice (Nothing red or purple).  Continue to drink clear liquids until bedtime.  7.  Before going to bed, mix second dose of MoviPrep solution:    Empty 1 Pouch A and 1 Pouch B into the disposable container    Add lukewarm drinking water to the top line of the container. Mix to dissolve    Refrigerate  THE DAY OF YOUR PROCEDURE      DATE: Friday 1/13  Beginning at 5:00 a.m. (5 hours before procedure):         1. Every 15 minutes, drink the solution down to the next mark (approx 8 oz) until the full liter is complete.  2. Follow completed prep with 16 oz. of clear liquid of your choice.    3. You may drink clear liquids until 6:00 am (4 HOURS BEFORE PROCEDURE).   MEDICATION INSTRUCTIONS  Unless otherwise instructed, you should take regular prescription medications with a small sip of water   as early as possible the  morning of your procedure.    Stop taking Coumadin on  Tuesday 1/10  (3 days before procedure).           OTHER INSTRUCTIONS  You will need a responsible adult at least 50 years of age to accompany you and drive you home.   This person must remain in the waiting room during your procedure.  Wear loose fitting clothing that is easily removed.  Leave jewelry and other valuables at home.  However, you may wish to bring a book to read or  an iPod/MP3 player to listen to music as you wait for your procedure to start.  Remove all body piercing jewelry and leave at home.  Total time from sign-in until discharge is approximately 2-3 hours.  You should go home directly after your procedure and rest.  You can resume normal activities the  day after your procedure.  The day of your procedure you should not:   Drive   Make legal decisions   Operate machinery   Drink alcohol   Return to work  You will receive specific instructions about eating, activities and medications before you leave.    The above instructions have been reviewed and explained to  me by   Ezra Sites RN  November 18, 2010 4:38 PM     I fully understand and can verbalize these instructions _____________________________ Date _________

## 2010-12-12 NOTE — Procedures (Signed)
Summary: Colonoscopy  Patient: Claxton Levitz Note: All result statuses are Final unless otherwise noted.  Tests: (1) Colonoscopy (COL)   COL Colonoscopy           DONE     [facility]     [address] [place], [place]  [address]           COLONOSCOPY PROCEDURE REPORT           PATIENT:  [name], [name]  MR#:  [id number]     BIRTHDATE:  22-Mar-1961, 50 yrs. old  GENDER:  male     ENDOSCOPIST:  Iva Boop, MD, [facility]     REF. BY:  Fleet Contras, M.D.     PROCEDURE DATE:  11/22/2010     PROCEDURE:  Colonoscopy with snare polypectomy     ASA CLASS:  Class III     INDICATIONS:  screening     MEDICATIONS:   Fentanyl 50 mcg IV, Versed 5 mg           DESCRIPTION OF PROCEDURE:   After the risks benefits and     alternatives of the procedure were thoroughly explained, informed     consent was obtained.  Digital rectal exam was performed and     revealed no abnormalities and normal prostate.   The  endoscope     was introduced through the anus and advanced to the cecum, which     was identified by both the appendix and ileocecal valve, without     limitations.  The quality of the prep was adequate, using     MoviPrep.  The instrument was then slowly withdrawn as the colon     was fully examined. withdrawal: 14 minutes     <<PROCEDUREIMAGES>>           FINDINGS:  A sessile polyp was found at the cecal area. It was 6     mm in size. Polyp was snared without cautery. Retrieval was     successful. This was otherwise a normal examination of the colon.     Retroflexed views in the rectum revealed no abnormalities.    The     scope was then withdrawn from the patient and the procedure     completed.           COMPLICATIONS:  None     ENDOSCOPIC IMPRESSION:     1) 6 mm sessile polyp removed from cecum.     2) Otherwise normal examination, adequate prep           RECOMMENDATIONS:  Resume Coumadin (warfarin) at usual dose and get     PT/INR checked by the doctor  monitoring that in 1 week.           REPEAT EXAM:  In for Colonoscopy, pending biopsy results. He has     poor IV access and required an ultrasound-guided IV placement.           Iva Boop, MD, [facility]           CC:  Fleet Contras, M.D. and The Patient           n.     eSIGNED:   Iva Boop at 11/22/2010 01:18 PM           Cabiness, Lorre Munroe, [id number]  Note: An exclamation mark (!) indicates a result that was not dispersed into the flowsheet. Document Creation Date: 11/22/2010 1:18 PM _______________________________________________________________________  (1) Order  result status: Final Collection or observation date-time: 11/22/2010 13:10 Requested date-time:  Receipt date-time:  Reported date-time:  Referring Physician:   Ordering Physician: Stan Head [phone]) Specimen Source:  Source: Launa Grill Order Number: [id number] Lab site:

## 2010-12-12 NOTE — Miscellaneous (Signed)
Summary: [facility]  Clinical Lists Changes  Medications: Added new medication of MOVIPREP 100 GM  SOLR (PEG-KCL-NACL-NASULF-NA ASC-C) As per prep instructions. - Signed Rx of MOVIPREP 100 GM  SOLR (PEG-KCL-NACL-NASULF-NA ASC-C) As per prep instructions.;  #1 x 0;  Signed;  Entered by: Ezra Sites RN;  Authorized by: Iva Boop MD, [facility];  Method used: [facility] [address] [phone]*, [address] [place], [place], [place]  [id number], Ph: [phone] or [phone], Fax: [phone] Observations: Added new observation of NKA: T (11/18/2010 15:42)    Prescriptions: MOVIPREP 100 GM  SOLR (PEG-KCL-NACL-NASULF-NA ASC-C) As per prep instructions.  #1 x 0   Entered by:   Ezra Sites RN   Authorized by:   Iva Boop MD, [facility]   Signed by:   Ezra Sites RN on 11/18/2010   Method used:   [facility]        [facility]  [address] [phone]* (retail)       [address]       [place], [place]  [id number]       Ph: [phone] or [phone]       Fax: [phone]   RxID:   [id number]

## 2010-12-12 NOTE — Letter (Signed)
Summary: Anticoagulation/[facility]  Anticoagulation/[facility]   Imported By: Sherian Rein 10/22/2010 10:21:36  _____________________________________________________________________  External Attachment:    Type:   Image     Comment:   External Document

## 2011-01-18 ENCOUNTER — Emergency Department (HOSPITAL_COMMUNITY)
Admission: EM | Admit: 2011-01-18 | Discharge: 2011-01-19 | Disposition: A | Discharge status: Home / Self Care | Payer: Medicare Other | Attending: Emergency Medicine | Admitting: Emergency Medicine

## 2011-01-18 DIAGNOSIS — G35 Multiple sclerosis: Secondary | ICD-10-CM | POA: Insufficient documentation

## 2011-01-18 DIAGNOSIS — R51 Headache: Secondary | ICD-10-CM | POA: Insufficient documentation

## 2011-01-18 DIAGNOSIS — R4589 Other symptoms and signs involving emotional state: Secondary | ICD-10-CM | POA: Insufficient documentation

## 2011-01-18 DIAGNOSIS — Z79899 Other long term (current) drug therapy: Secondary | ICD-10-CM | POA: Insufficient documentation

## 2011-01-24 ENCOUNTER — Emergency Department (HOSPITAL_COMMUNITY)
Admission: EM | Admit: 2011-01-24 | Discharge: 2011-01-24 | Disposition: A | Discharge status: Home / Self Care | Payer: Medicare Other | Source: Home / Self Care | Attending: Emergency Medicine | Admitting: Emergency Medicine

## 2011-01-24 ENCOUNTER — Emergency Department (HOSPITAL_COMMUNITY): Payer: Medicare Other

## 2011-01-24 ENCOUNTER — Observation Stay (HOSPITAL_COMMUNITY)
Admission: EM | Admit: 2011-01-24 | Discharge: 2011-01-28 | Disposition: A | Discharge status: Home / Self Care | Payer: Medicare Other | Attending: Internal Medicine | Admitting: Internal Medicine

## 2011-01-24 DIAGNOSIS — Z79899 Other long term (current) drug therapy: Secondary | ICD-10-CM | POA: Insufficient documentation

## 2011-01-24 DIAGNOSIS — G35 Multiple sclerosis: Secondary | ICD-10-CM | POA: Insufficient documentation

## 2011-01-24 DIAGNOSIS — Z86718 Personal history of other venous thrombosis and embolism: Secondary | ICD-10-CM | POA: Insufficient documentation

## 2011-01-24 DIAGNOSIS — I10 Essential (primary) hypertension: Secondary | ICD-10-CM | POA: Insufficient documentation

## 2011-01-24 DIAGNOSIS — R51 Headache: Secondary | ICD-10-CM | POA: Insufficient documentation

## 2011-01-24 DIAGNOSIS — Z7901 Long term (current) use of anticoagulants: Secondary | ICD-10-CM | POA: Insufficient documentation

## 2011-01-24 DIAGNOSIS — G44009 Cluster headache syndrome, unspecified, not intractable: Principal | ICD-10-CM | POA: Insufficient documentation

## 2011-01-25 LAB — COMPREHENSIVE METABOLIC PANEL
CO2: 25 mEq/L (ref 19–32)
Calcium: 8.9 mg/dL (ref 8.4–10.5)
Creatinine, Ser: 1.1 mg/dL (ref 0.4–1.5)
GFR calc non Af Amer: >60 mL/min (ref >60)
Glucose, Bld: 145 mg/dL — ABNORMAL HIGH (ref 70–99)

## 2011-01-25 LAB — DRUGS OF ABUSE SCREEN W/O ALC, ROUTINE URINE
Cocaine Metabolites: NEGATIVE
Marijuana Metabolite: POSITIVE — AB
Methadone: NEGATIVE
Opiate Screen, Urine: NEGATIVE
Phencyclidine (PCP): NEGATIVE

## 2011-01-25 LAB — BLOOD GAS, ARTERIAL
Acid-Base Excess: 1.1 mmol/L (ref 0.0–2.0)
Bicarbonate: 25.4 mEq/L — ABNORMAL HIGH (ref 20.0–24.0)
O2 Saturation: 94.3 %
Patient temperature: 98.6
TCO2: 26.7 mmol/L (ref 0–100)
pO2, Arterial: 73.6 mmHg — ABNORMAL LOW (ref 80.0–100.0)

## 2011-01-25 LAB — CBC
HCT: 42.5 % (ref 39.0–52.0)
MCV: 83.3 fL (ref 78.0–100.0)
Platelets: 268 10*3/uL (ref 150–400)
RBC: 5.1 MIL/uL (ref 4.22–5.81)
WBC: 15.9 10*3/uL — ABNORMAL HIGH (ref 4.0–10.5)

## 2011-01-25 LAB — DIFFERENTIAL
Basophils Absolute: 0 10*3/uL (ref 0.0–0.1)
Eosinophils Absolute: 0 10*3/uL (ref 0.0–0.7)
Lymphocytes Relative: 7 % — ABNORMAL LOW (ref 12–46)
Lymphs Abs: 1.1 10*3/uL (ref 0.7–4.0)
Neutrophils Relative %: 90 % — ABNORMAL HIGH (ref 43–77)

## 2011-01-25 LAB — PROTIME-INR: INR: 1.09 (ref 0.00–1.49)

## 2011-01-25 LAB — APTT: aPTT: <20 seconds — ABNORMAL LOW (ref 24–37)

## 2011-01-25 LAB — CARBAMAZEPINE LEVEL, TOTAL: Carbamazepine Lvl: 6.4 ug/mL (ref 4.0–12.0)

## 2011-01-26 LAB — PROTIME-INR: INR: 1.3 (ref 0.00–1.49)

## 2011-01-27 LAB — PROTIME-INR
INR: 1.38 (ref 0.00–1.49)
Prothrombin Time: 17.2 seconds — ABNORMAL HIGH (ref 11.6–15.2)

## 2011-01-28 LAB — PROTIME-INR
INR: 1.46 (ref 0.00–1.49)
Prothrombin Time: 17.9 seconds — ABNORMAL HIGH (ref 11.6–15.2)

## 2011-01-28 NOTE — Consult Note (Signed)
NAME:  [name], [name] NO.:  [id number]  MEDICAL RECORD NO.:  [id number]           PATIENT TYPE:  O  LOCATION:  3009                         FACILITY:  [facility]  PHYSICIAN:  Thana Farr, MD    DATE OF BIRTH:  01/20/1961  DATE OF CONSULTATION:  01/26/2011 DATE OF DISCHARGE:                                CONSULTATION   CONSULT CALLED BY:  Gary Clos, MD  HISTORY OF PRESENT ILLNESS:  [name] is a 50 year old male with a history of multiple sclerosis and pulmonary embolism, on Coumadin, who was admitted with complaints of cluster headache.  The patient reports that he had his first exacerbation of cluster headache in 2007.  He was admitted at that time and started on some IV medications.  His headaches resolved and he has not had recurrent headaches until Wednesday of this week.  On Wednesday, he began to experience some severe headache on the right.  He describes the headache as starting on the right on the top of his head and radiating down to his nose.  They are sharp and severe and rated as 10+/10.  Initially, the headaches were not as severe.  He did see his outpatient neurologist.  The patient was placed on steroids.  Despite the steroids, his headaches have worsened.  He reports he is having about 3 per day.  They last from 5-10 minutes.  The oxygen does seem to be helping some and he reports that with the oxygen his headaches are down to a 9-10/10.  He has been tried on Imitrex.  He reports also being tried on OxyContin and Percocet.  Although there has been some improvement in the headaches, they have not gotten significantly better with any of these medications.  PAST MEDICAL HISTORY: 1. 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 2. Multiple sclerosis.  The patient was previously on Tysabri.  He is     now waiting to be started on Gilenya. 3. Hypertension.  MEDICATIONS AT HOME:  Baclofen,  Coumadin, carbamazepine, donepezil, gabapentin, lisinopril, hydrochlorothiazide, omeprazole, oxaprozin, oxycodone, prednisone, and sertraline.  FAMILY HISTORY:  Negative for migraine and cluster headache.  SOCIAL HISTORY:  The patient smokes.  He denies history of alcohol abuse.  He has a past history of cocaine abuse.  Drug screen is currently positive for marijuana.  PHYSICAL EXAMINATION:  Blood pressure 135/88, heart rate 61, respiratory rate 18, and temperature 98.2.  On mental status testing, the patient is alert and oriented.  He stutters when he speaks.  He is able to follow commands without difficulty.  On cranial nerve testing, II, visual fields grossly intact.  III, IV, and VI, extraocular movements are intact.  V and VII, decrease in the right nasolabial fold.  VIII, grossly intact.  IX and X, positive gag.  XI, bilateral shoulder shrug. XII, midline tongue extension.  On motor exam, the patient gives 5/5 strength in the bilateral upper extremities.  No drift is noted.  In the lower extremities, the patient is unable to lift the right leg off the bed.  He is able to  lift the left leg off the bed approximately 2 inches.  There is significant increased tone in both lower extremities. The patient is unable to perform plantarflexion using the right lower extremity.  He is able to perform, but unable to maintain plantarflexion with the left lower extremity.  Plantars are mute bilaterally.  On cerebellar testing, finger-to-nose is intact.  The patient is unable to perform heel-to-shin.  LABORATORY DATA:  White blood cell count 15.9, platelet count 268, hemoglobin and hematocrit 14.8 and 42.5 respectively.  PT and INR 16.4 and 1.30 respectively, sodium 127, potassium 4.0, chloride and CO2 105 and 25 respectively, BUN and creatinine 16 and 1.1 respectively, glucose 145, bilirubin 0.5, alk phos 63, SGOT and SGPT 23 and 29, total protein 7.8, albumin 3.9, calcium 8.9.  Tegretol  level 6.4.  ASSESSMENT:  [name] is a 50 year old male that is having recurrence of his cluster headaches.  He does not seem to have frequent recurrences.  He has been tried on multiple medications while here in the hospital without significant decrease in his pain and actually he reports that the best improvement in pain since admission has been noted with the oxygen.  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 Cluster headaches would not seem to respond particularly well to narcotics in any event.  He may actually do quite better from other modalities of treatment.  PLAN: 1. We will start verapamil 120 mg p.o. now and repeat again tonight.     On January 27, 2011, to start maintenance Verapamil using the ER     preparation at 120 mg p.o. nightly.  We will follow blood pressures     closely. 2. Continue steroids with taper to be anticipated. 3. Continue O2. 4. The patient's multiple sclerosis seems stable at this point.  We     will not intervene at this time.          ______________________________ Thana Farr, MD     LR/MEDQ  D:  01/26/2011  T:  01/27/2011  Job:  [id number]  Electronically Signed by Thana Farr MD on 01/28/2011 11:38:53 AM

## 2011-01-29 LAB — THC (MARIJUANA), URINE, CONFIRMATION: Marijuana, Ur-Confirmation: 32 NG/ML — ABNORMAL HIGH

## 2011-01-30 NOTE — Discharge Summary (Signed)
[name], [name] NO.:  [id number]  MEDICAL RECORD NO.:  [id number]           PATIENT TYPE:  O  LOCATION:  3009                         FACILITY:  [facility]  PHYSICIAN:  Marinda Elk, M.D.DATE OF BIRTH:  Aug 21, 1961  DATE OF ADMISSION:  01/24/2011 DATE OF DISCHARGE:  01/28/2011                              DISCHARGE SUMMARY   PRIMARY CARE DOCTOR:  Fleet Contras, MD  DISCHARGE DIAGNOSES: 1. Intractable cluster headaches. 2. Hypertension. 3. History of deep venous thrombosis.  MEDICATIONS: 1. Prednisone 50 mg taper. 2. Verapamil 250 mg p.o. at bedtime. 3. Warfarin 4 mg daily. 4. Aricept 1 tab daily. 5. Baclofen 20 mg b.i.d. 6. Carvedilol 1 tab daily at bedtime. 7. Daypro 1 tab b.i.d. 8. Gabapentin 600 mg t.i.d. 9. Lisinopril 10/12.5 mg 1 tab daily. 10.Lunesta 3 mg at bedtime. 11.Omeprazole 20 mg daily. 12.OxyContin CR 20 mg 4 times a day. 13.Percocet 5/325 mg as needed. 14.Zoloft 50 mg daily. 15.Imitrex 20 mg inhaled q.8 h. p.r.n.  PROCEDURES PERFORMED:  CT head showed negative.  BRIEF ADMITTING HISTORY AND PHYSICAL:  Please refer to H and P from January 25, 2011, for further details.  This is a 50 year old man with history of MSPE on Coumadin has been experiencing headache over the last week.  The patient states that the headache is more on the right side and lasted around 45 minutes and 1 hour, sometimes it happens couple times a day, intense, incapacitating, visual symptoms and auditory symptoms make it worse.  BRIEF HOSPITAL COURSE: 1. Cluster headaches.  He was started on oxygen with no improve then     he was admitted to the floor, started on Imitrex and steroids with     mild improvement.  Neurology was consulted.  They recommended to     start her verapamil after 2 days of this treatment with steroid,     Imitrex and oxygen.  His pain did improve.  So, he was discharged     in stable condition.  Neurology recommended to followup with  his     neurologist.  He was started and increased on verapamil as needed.     He has some components of what it seems like trigeminal neuralgia     and Neurology agrees.  They recommended to continue with current     treatment as it seems to be improving.  We will continue     the current dose of verapamil and titrate as needed.  If blood     pressure medication needs to be adjusted, we could go up on the     verapamil.  His medications currently stable and no changes will be     made.  Hypertension currently stable, no changes will be made.  We     will continue to watch.  We will ask his primary care to monitor     his blood pressure.  As he is on verapamil, any other medication     needs to be increased per his hypertension.  We recommend to     increase verapamil. 2. History of DVT.  INR was low when he  came in.  He was started on     Lovenox.  His INR was going up.  He would go on Lovenox and     Coumadin.  We would have advanced home care to check a PT and INR     on Thursday.  He will continue to overlap until INR is greater than     2.  Vitals on the day of discharge shows temperature 98, pulse 93, respirations 20, blood pressure 143/94.  He was satting 95% on room air.  Labs on day of discharge shows an INR of 1.4.  DISPOSITION:  The patient will follow up with his primary care doctor in the next week to see how his blood pressure is doing.  We will also follow up with Dr. Sandria Manly this week to see how his cluster headaches are doing.     Marinda Elk, M.D.     AF/MEDQ  D:  01/28/2011  T:  01/29/2011  Job:  [id number]  Electronically Signed by Lambert Keto M.D. on 01/30/2011 08:15:12 AM

## 2011-01-31 ENCOUNTER — Telehealth: Payer: Self-pay | Admitting: Internal Medicine

## 2011-01-31 MED ORDER — COLESTIPOL HCL 1 G PO TABS: 2.0000 g | ORAL_TABLET | Freq: Every day | ORAL | Status: AC

## 2011-01-31 NOTE — Telephone Encounter (Signed)
Patient  just had a colon January 13,2012.   He is aware to go pick up his RX at the pharmacy.

## 2011-01-31 NOTE — Telephone Encounter (Signed)
Pt is still having problems with diarrhea and asking for a refill on his Colestid.  Please advise

## 2011-01-31 NOTE — Telephone Encounter (Signed)
Ok to refill Is he having a colonoscopy? Refills x 5

## 2011-02-10 NOTE — H&P (Signed)
NAME:  [name], [name]             ACCOUNT NO.:  [id number]  MEDICAL RECORD NO.:                     PATIENT TYPE:  LOCATION:                                 FACILITY:  PHYSICIAN:  Eduard Clos, MD     DATE OF BIRTH:  DATE OF ADMISSION:  01/24/2011 DATE OF DISCHARGE:                             HISTORY & PHYSICAL   PRIMARY CARE PHYSICIAN:  Fleet Contras, MD  CHIEF COMPLAINT:  Headache.  HISTORY OF PRESENTING ILLNESS:  A 50 year old male with known history of multiple sclerosis, pulmonary embolism on Coumadin, has been experiencing some headache over the last 1 week.  The patient says the headache is more in the right side of the head, last around 45 minutes to 1 hour, sometimes happens a couple of times a day, intense incapacitating with some visual symptoms.  The patient did come earlier to the ER at [facility], wherein he was given some pain medication and the patient was sent home.  After going home he again had a spell again which was very severe.  He came to the ER at this time at [facility] when he was given oxygen after which his headache got aborted by itself. The patient states that he was diagnosed with a cluster headache 4-5 years ago at which had Dr. Sandria Manly, neurologist had treated him with oxygen and some injection subcutaneously.  He states the episodes looks a similar, at this time a CT head was done early today which was showing nothing acute.  At this time, the patient has been admitted for observation for his intractable headache which at this time is improved.  The patient denies any chest pain, nausea, vomiting.  Denies any loss of consciousness.  The patient does have known history of MS with chronic lower extremity weakness for which he uses a walker and a wheelchair. Denies any dysuria, discharge, diarrhea or any abdominal pain, cough or phlegm or any fever or chills.  The patient's visual symptoms at this time has completely resolved.  The  patient states that when he had the visual symptoms affecting both eyes, there was no tearing with eye and his symptoms only aggravating to headache was present.  When the headache happened he did not have any upper extremity weakness, he does have chronic lower extremity weakness.  PAST MEDICAL HISTORY: 1. History of embolism and recurrence of right axillary vein     thrombosis in 2010.  He is on chronic Coumadin therapy. 2. History of multiple sclerosis. 3. History of hypertension.  PAST SURGICAL HISTORY:  Cholecystectomy.  MEDICATION PRIOR TO ADMISSION:  Baclofen, Coumadin, carbamazepine, dalfampridine, donepezil, gabapentin, lisinopril, hydrochlorothiazide, omeprazole, oxaprozin, oxycodone, prednisone, and sertraline.  ALLERGIES:  No known drug allergies.  FAMILY HISTORY:  Negative for any migraine.  Negative for any heart disease, stroke, or cancer.  SOCIAL HISTORY:  The patient smokes cigarettes and advised to quit smoking.  He denies any alcohol abuse.  He has had past history of drug abuse including cocaine and marijuana but he denies at this time.  REVIEW OF SYSTEMS:  As per history  of presenting illness, nothing else significant.  PHYSICAL EXAMINATION:  GENERAL:  The patient examined at bedside not in acute distress. VITAL SIGNS:  Blood pressure 140/78, pulse 90 per minute, temperature 97.8, respirations 20 per minute, and O2 sat 95%.  HEENT:  Anicteric. No pallor.  PLA positive.  No facial asymmetry.  Tongue is midline. NECK:  No neck rigidity. CHEST:  Bilateral air entry present.  No rhonchi.  No crepitation. HEART:  S1 and S2 heard. ABDOMEN:  Soft, nontender.  Bowel sounds heard.  No guarding or rigidity. CNS:  Alert awake, and oriented to time, place, and person.  Moves upper and lower extremities  Both upper extremities are 5/5.  Lower extremity, he has weakness, is able to mild resist against pressure. EXTREMITIES:  Peripheral pulses felt.  No acute ischemic  changes.  LABORATORY DATA:  CT of the head shows negative CT head.  At this time, I do not have any lab which I am ordering now CMET, CBC, and PT/INR  ASSESSMENT: 1. Recurrent persistent headache, possible recurrence of his known     cluster headaches. 2. History of multiple sclerosis. 3. History of pulmonary embolism. 4. History of hypertension. 5. History of poly substance abuse.  PLAN: 1. At this time, we will admit the patient to Medical Floor. 2. For his headache, at this time we will keep the patient on     continuation of oxygen.  If he gets another headache symptom, we     will try to give him at least 50 minutes of 100% oxygen, which has     aborted his headache few hours ago as this is helping him.  The     patient probably may benefit from prophylactic medication like     verapamil of which we may have to start after discussing with     neurologist. 3. We need to verify his home medication. 4. We have ordered CMET and CBC which we need to follow. 5. If his headache recur again we may do MRI to make sure there is no     MS exacerbation, particularly involving the optic neuritis. 6. Further plan as condition evolves.     Eduard Clos, MD     ANK/MEDQ  D:  01/25/2011  T:  01/25/2011  Job:  [id number]  Electronically Signed by Midge Minium MD on 02/10/2011 07:57:15 AM

## 2011-02-16 LAB — COMPREHENSIVE METABOLIC PANEL
ALT: 90 U/L — ABNORMAL HIGH (ref 0–53)
AST: 68 U/L — ABNORMAL HIGH (ref 0–37)
Alkaline Phosphatase: 72 U/L (ref 39–117)
BUN: 7 mg/dL (ref 6–23)
BUN: 7 mg/dL (ref 6–23)
CO2: 22 mEq/L (ref 19–32)
CO2: 27 mEq/L (ref 19–32)
Calcium: 9.5 mg/dL (ref 8.4–10.5)
Chloride: 106 mEq/L (ref 96–112)
Creatinine, Ser: 0.77 mg/dL (ref 0.4–1.5)
GFR calc Af Amer: >60 mL/min (ref >60)
GFR calc non Af Amer: >60 mL/min (ref >60)
Glucose, Bld: 95 mg/dL (ref 70–99)
Potassium: 3.7 mEq/L (ref 3.5–5.1)
Total Bilirubin: 0.9 mg/dL (ref 0.3–1.2)

## 2011-02-16 LAB — CBC
HCT: 43.6 % (ref 39.0–52.0)
Hemoglobin: 14.4 g/dL (ref 13.0–17.0)
RBC: 5.38 MIL/uL (ref 4.22–5.81)
WBC: 5.4 10*3/uL (ref 4.0–10.5)

## 2011-02-16 LAB — DIFFERENTIAL
Basophils Absolute: 0 10*3/uL (ref 0.0–0.1)
Basophils Relative: 1 % (ref 0–1)
Eosinophils Absolute: 0.1 10*3/uL (ref 0.0–0.7)
Neutro Abs: 2.6 10*3/uL (ref 1.7–7.7)
Neutrophils Relative %: 47 % (ref 43–77)

## 2011-02-16 LAB — PROTIME-INR
INR: 2 — ABNORMAL HIGH (ref 0.00–1.49)
Prothrombin Time: 23.7 seconds — ABNORMAL HIGH (ref 11.6–15.2)

## 2011-02-18 LAB — PROTIME-INR
INR: 2.1 — ABNORMAL HIGH (ref 0.00–1.49)
INR: 2.5 — ABNORMAL HIGH (ref 0.00–1.49)

## 2011-02-19 LAB — URINALYSIS, ROUTINE W REFLEX MICROSCOPIC
Bilirubin Urine: NEGATIVE
Glucose, UA: NEGATIVE mg/dL
Glucose, UA: NEGATIVE mg/dL
Hgb urine dipstick: NEGATIVE
Hgb urine dipstick: NEGATIVE
Ketones, ur: NEGATIVE mg/dL
Protein, ur: NEGATIVE mg/dL
Protein, ur: NEGATIVE mg/dL
Urobilinogen, UA: 1 mg/dL (ref 0.0–1.0)

## 2011-02-19 LAB — DIFFERENTIAL
Basophils Absolute: 0 10*3/uL (ref 0.0–0.1)
Basophils Absolute: 0 10*3/uL (ref 0.0–0.1)
Basophils Absolute: 0 10*3/uL (ref 0.0–0.1)
Basophils Relative: 0 % (ref 0–1)
Basophils Relative: 0 % (ref 0–1)
Basophils Relative: 0 % (ref 0–1)
Eosinophils Absolute: 0.1 10*3/uL (ref 0.0–0.7)
Eosinophils Absolute: 0.2 10*3/uL (ref 0.0–0.7)
Eosinophils Absolute: 0.3 10*3/uL (ref 0.0–0.7)
Eosinophils Absolute: 0.1 10*3/uL (ref 0.0–0.7)
Eosinophils Relative: 2 % (ref 0–5)
Lymphocytes Relative: 14 % (ref 12–46)
Lymphs Abs: 2.9 10*3/uL (ref 0.7–4.0)
Monocytes Absolute: 0.4 10*3/uL (ref 0.1–1.0)
Monocytes Relative: 17 % — ABNORMAL HIGH (ref 3–12)
Monocytes Relative: 8 % (ref 3–12)
Neutro Abs: 16 10*3/uL — ABNORMAL HIGH (ref 1.7–7.7)
Neutrophils Relative %: 34 % — ABNORMAL LOW (ref 43–77)
Neutrophils Relative %: 76 % (ref 43–77)
Neutrophils Relative %: 82 % — ABNORMAL HIGH (ref 43–77)

## 2011-02-19 LAB — CBC
HCT: 33.2 % — ABNORMAL LOW (ref 39.0–52.0)
HCT: 36.6 % — ABNORMAL LOW (ref 39.0–52.0)
HCT: 38.3 % — ABNORMAL LOW (ref 39.0–52.0)
HCT: 41.1 % (ref 39.0–52.0)
HCT: 45.7 % (ref 39.0–52.0)
Hemoglobin: 12.3 g/dL — ABNORMAL LOW (ref 13.0–17.0)
Hemoglobin: 13 g/dL (ref 13.0–17.0)
Hemoglobin: 13.1 g/dL (ref 13.0–17.0)
Hemoglobin: 15.3 g/dL (ref 13.0–17.0)
MCHC: 33.4 g/dL (ref 30.0–36.0)
MCHC: 33.8 g/dL (ref 30.0–36.0)
MCHC: 34 g/dL (ref 30.0–36.0)
MCV: 79.8 fL (ref 78.0–100.0)
MCV: 79.8 fL (ref 78.0–100.0)
MCV: 79.8 fL (ref 78.0–100.0)
MCV: 80.3 fL (ref 78.0–100.0)
MCV: 80.3 fL (ref 78.0–100.0)
Platelets: 360 10*3/uL (ref 150–400)
Platelets: 362 10*3/uL (ref 150–400)
Platelets: 367 10*3/uL (ref 150–400)
Platelets: 396 10*3/uL (ref 150–400)
RBC: 4.14 MIL/uL — ABNORMAL LOW (ref 4.22–5.81)
RBC: 4.52 MIL/uL (ref 4.22–5.81)
RBC: 5.15 MIL/uL (ref 4.22–5.81)
RBC: 4.79 MIL/uL (ref 4.22–5.81)
RBC: 5.71 MIL/uL (ref 4.22–5.81)
RDW: 15.4 % (ref 11.5–15.5)
RDW: 15.4 % (ref 11.5–15.5)
RDW: 15.5 % (ref 11.5–15.5)
RDW: 15.7 % — ABNORMAL HIGH (ref 11.5–15.5)
RDW: 15.6 % — ABNORMAL HIGH (ref 11.5–15.5)
RDW: 15.8 % — ABNORMAL HIGH (ref 11.5–15.5)
WBC: 5.4 10*3/uL (ref 4.0–10.5)
WBC: 5.4 10*3/uL (ref 4.0–10.5)
WBC: 6.6 10*3/uL (ref 4.0–10.5)
WBC: 9.7 10*3/uL (ref 4.0–10.5)
WBC: 19.4 10*3/uL — ABNORMAL HIGH (ref 4.0–10.5)

## 2011-02-19 LAB — BRAIN NATRIURETIC PEPTIDE: Pro B Natriuretic peptide (BNP): <30 pg/mL (ref 0.0–100.0)

## 2011-02-19 LAB — POCT I-STAT, CHEM 8
BUN: 10 mg/dL (ref 6–23)
BUN: 18 mg/dL (ref 6–23)
Creatinine, Ser: 1.1 mg/dL (ref 0.4–1.5)
Glucose, Bld: 149 mg/dL — ABNORMAL HIGH (ref 70–99)
Hemoglobin: 15 g/dL (ref 13.0–17.0)
Hemoglobin: 17 g/dL (ref 13.0–17.0)
Potassium: 3.6 mEq/L (ref 3.5–5.1)
Sodium: 136 mEq/L (ref 135–145)
TCO2: 22 mmol/L (ref 0–100)
TCO2: 29 mmol/L (ref 0–100)

## 2011-02-19 LAB — BASIC METABOLIC PANEL
BUN: 2 mg/dL — ABNORMAL LOW (ref 6–23)
BUN: 6 mg/dL (ref 6–23)
CO2: 25 mEq/L (ref 19–32)
Calcium: 8.9 mg/dL (ref 8.4–10.5)
Chloride: 107 mEq/L (ref 96–112)
Creatinine, Ser: 1.03 mg/dL (ref 0.4–1.5)
Creatinine, Ser: 1.03 mg/dL (ref 0.4–1.5)
GFR calc non Af Amer: >60 mL/min (ref >60)
Glucose, Bld: 138 mg/dL — ABNORMAL HIGH (ref 70–99)
Potassium: 3.8 mEq/L (ref 3.5–5.1)

## 2011-02-19 LAB — PROTIME-INR
INR: 1.3 (ref 0.00–1.49)
INR: 2.1 — ABNORMAL HIGH (ref 0.00–1.49)
INR: 2.5 — ABNORMAL HIGH (ref 0.00–1.49)
INR: 2.9 — ABNORMAL HIGH (ref 0.00–1.49)
INR: 3.4 — ABNORMAL HIGH (ref 0.00–1.49)
INR: 1.1 (ref 0.00–1.49)
Prothrombin Time: 13.7 seconds (ref 11.6–15.2)
Prothrombin Time: 16.4 seconds — ABNORMAL HIGH (ref 11.6–15.2)
Prothrombin Time: 21.3 seconds — ABNORMAL HIGH (ref 11.6–15.2)
Prothrombin Time: 25.2 seconds — ABNORMAL HIGH (ref 11.6–15.2)
Prothrombin Time: 28.9 seconds — ABNORMAL HIGH (ref 11.6–15.2)
Prothrombin Time: 32.4 seconds — ABNORMAL HIGH (ref 11.6–15.2)
Prothrombin Time: 33.7 seconds — ABNORMAL HIGH (ref 11.6–15.2)
Prothrombin Time: 38.9 seconds — ABNORMAL HIGH (ref 11.6–15.2)
Prothrombin Time: 14.5 seconds (ref 11.6–15.2)

## 2011-02-19 LAB — COMPREHENSIVE METABOLIC PANEL
ALT: 62 U/L — ABNORMAL HIGH (ref 0–53)
ALT: 49 U/L (ref 0–53)
AST: 33 U/L (ref 0–37)
Albumin: 3.3 g/dL — ABNORMAL LOW (ref 3.5–5.2)
Albumin: 3.4 g/dL — ABNORMAL LOW (ref 3.5–5.2)
Alkaline Phosphatase: 114 U/L (ref 39–117)
Alkaline Phosphatase: 77 U/L (ref 39–117)
Alkaline Phosphatase: 79 U/L (ref 39–117)
BUN: 8 mg/dL (ref 6–23)
BUN: 15 mg/dL (ref 6–23)
CO2: 25 mEq/L (ref 19–32)
CO2: 27 mEq/L (ref 19–32)
CO2: 29 mEq/L (ref 19–32)
Calcium: 8.7 mg/dL (ref 8.4–10.5)
Calcium: 8.6 mg/dL (ref 8.4–10.5)
Chloride: 102 mEq/L (ref 96–112)
Chloride: 100 mEq/L (ref 96–112)
Creatinine, Ser: 0.99 mg/dL (ref 0.4–1.5)
Creatinine, Ser: 1.1 mg/dL (ref 0.4–1.5)
Creatinine, Ser: 0.9 mg/dL (ref 0.4–1.5)
GFR calc Af Amer: >60 mL/min (ref >60)
GFR calc Af Amer: >60 mL/min (ref >60)
GFR calc non Af Amer: >60 mL/min (ref >60)
GFR calc non Af Amer: >60 mL/min (ref >60)
GFR calc non Af Amer: >60 mL/min (ref >60)
Glucose, Bld: 109 mg/dL — ABNORMAL HIGH (ref 70–99)
Glucose, Bld: 156 mg/dL — ABNORMAL HIGH (ref 70–99)
Potassium: 4 mEq/L (ref 3.5–5.1)
Potassium: 3.4 mEq/L — ABNORMAL LOW (ref 3.5–5.1)
Sodium: 139 mEq/L (ref 135–145)
Total Bilirubin: 0.5 mg/dL (ref 0.3–1.2)
Total Bilirubin: 0.5 mg/dL (ref 0.3–1.2)
Total Bilirubin: 0.7 mg/dL (ref 0.3–1.2)
Total Protein: 6.1 g/dL (ref 6.0–8.3)

## 2011-02-19 LAB — PLATELET INHIBITION P2Y12
P2Y12 % Inhibition: 2 %
Platelet Function  P2Y12: 232 [PRU] (ref 194–418)
Platelet Function Baseline: 236 [PRU] (ref 194–418)

## 2011-02-19 LAB — PROTEIN S, TOTAL: Protein S Ag, Total: 104 % (ref 70–140)

## 2011-02-19 LAB — PROTHROMBIN GENE MUTATION

## 2011-02-19 LAB — ANTITHROMBIN III: AntiThromb III Func: 104 % (ref 76–126)

## 2011-02-19 LAB — CULTURE, BLOOD (ROUTINE X 2)

## 2011-02-19 LAB — PROTEIN C, TOTAL: Protein C, Total: 59 % — ABNORMAL LOW (ref 70–140)

## 2011-02-19 LAB — BETA-2-GLYCOPROTEIN I ABS, IGG/M/A
Beta-2 Glyco I IgG: 9 U/mL (ref <20)
Beta-2-Glycoprotein I IgA: <4 U/mL (ref <10)
Beta-2-Glycoprotein I IgM: <4 U/mL (ref <10)

## 2011-02-19 LAB — LUPUS ANTICOAGULANT PANEL
DRVVT: 48.6 secs — ABNORMAL HIGH (ref 36.1–47.0)
PTT Lupus Anticoagulant: 86.4 secs — ABNORMAL HIGH (ref 36.3–48.8)
dRVVT Incubated 1:1 Mix: 40.8 secs (ref 36.1–47.0)

## 2011-02-19 LAB — CARDIOLIPIN ANTIBODIES, IGG, IGM, IGA
Anticardiolipin IgA: 12 [APL'U] — ABNORMAL LOW (ref <13)
Anticardiolipin IgM: <7 [MPL'U] — ABNORMAL LOW (ref <10)

## 2011-02-19 LAB — CATH TIP CULTURE: Culture: 10

## 2011-02-19 LAB — HEPARIN LEVEL (UNFRACTIONATED)
Heparin Unfractionated: 0.41 IU/mL (ref 0.30–0.70)
Heparin Unfractionated: 0.73 IU/mL — ABNORMAL HIGH (ref 0.30–0.70)

## 2011-03-25 NOTE — Consult Note (Signed)
[name], [name] NO.:  [id number]   MEDICAL RECORD NO.:  [id number]          PATIENT TYPE:  INP   LOCATION:  5122                         FACILITY:  [facility]   PHYSICIAN:  Iva Boop, MD,FACGDATE OF BIRTH:  Jun 19, 1961   DATE OF CONSULTATION:  02/21/2009  DATE OF DISCHARGE:                                 CONSULTATION   REQUESTING PHYSICIAN:  Velora Heckler, MD   REASON FOR CONSULTATION:  Choledocholithiasis suspected on  intraoperative cholangiogram.   ASSESSMENT:  A 50 year old [place] American man with multiple sclerosis  who had a laparoscopic cholecystectomy yesterday.  The intraoperative  cholangiogram (self-viewed) does not drain and there is a suggestion of  a distal common bile duct stone.   RECOMMENDATIONS AND PLAN:  Endoscopic retrograde  cholangiopancreatography with likely sphincterotomy and stone  extraction.  The risks, benefits, and indications have been reviewed  with the patient.  We will continue his Unasyn.   HISTORY:  A 50 year old man as described above.  He had had some  abdominal bloating and pain for about two and a half weeks.  On  Thursday, he had right upper quadrant pain.  He was unable to eat well  after that, had a temperature of 100.8 when he was admitted to the  hospital on February 17, 2009, in the emergency department.  Found to have  gallstones, and HIDA scan with delayed filling of the gallbladder and a  laparoscopic cholecystectomy was carried out yesterday.  Findings as  above.  He has some postoperative pain at this time.  He is hungry.  Morphine has been controlling his postoperative pain at this time.  He  has not been having any nausea, vomiting, or diarrhea.   PAST MEDICAL HISTORY:  Multiple sclerosis, hypertension, depression,  pulmonary embolism in 2005, osteoarthritis, gastroesophageal reflux  disease, pneumonia, L1 compression fracture, prior tonsillectomy, left  knee arthroscopy.   MEDICATIONS:  IV  Unasyn, baclofen, Neurontin, Prinivil, Skelaxin,  Pamelor, Protonix, Zoloft, and Vicodin.   ALLERGIES:  TIZANIDINE.   FAMILY HISTORY:  No gallbladder disease or colon cancer.  Father died at  age 59 from leukemia.   REVIEW OF SYSTEMS:  Left-sided weakness from his multiple sclerosis.  His last flare that was about 2 years ago.  His mood has been stable.  Positive for urinary hesitancy.  All other review of systems reviewed  and negative.   PHYSICAL EXAMINATION:  GENERAL:  Well-developed, robust, African  American man in no acute distress.  VITAL SIGNS:  Temperature 98.3, pulse 101, respirations 16, blood  pressure 132/75.  HEENT:  The eyes are anicteric.  Conjunctivae are without pallor.  Mouth  is clear, moist.  Good teeth and dentition.  NECK:  No jugular venous distention in the neck.  No mass.  LUNGS:  Clear bilaterally.  No rales or wheezes.  HEART:  S1 and S2.  No murmurs or gallops.  ABDOMEN:  Soft, appropriately tender around his laparoscopic incisions.  Bowel sounds present.  No masses.  EXTREMITIES:  Free of edema, cyanosis, or clubbing.  Pedal pulses are  full.  NEUROLOGIC:  He is alert  and oriented x3.  No tremor.  PSYCH:  Appropriate mood and affect.   LABORATORY DATA:  White count 7.1, hemoglobin 13.1, platelets 167.  Coags were normal.  LFTs normal on February 19, 2009, initially elevated  transaminases, which normalized.  BMET normal.  UA negative.  Ultrasound  showed gallstones.  HIDA scan as above.  Intraoperative cholangiogram as  above and self-viewed as well.  Old chart reviewed as well, i.e.  hospital chart.   Hospital chart/old records reviewed.  I appreciate the opportunity in the care for this patient.      Iva Boop, MD,FACG  Electronically Signed     CEG/MEDQ  D:  02/21/2009  T:  02/22/2009  Job:  [id number]   cc:   Velora Heckler, MD

## 2011-03-25 NOTE — Discharge Summary (Signed)
NAME:  [name], [name] NO.:  [id number]   MEDICAL RECORD NO.:  [id number]          PATIENT TYPE:  INP   LOCATION:  5531                         FACILITY:  [facility]   PHYSICIAN:  Fleet Contras, M.D.    DATE OF BIRTH:  1960-11-28   DATE OF ADMISSION:  03/07/2009  DATE OF DISCHARGE:  03/12/2009                               DISCHARGE SUMMARY   HISTORY OF PRESENT ILLNESS:  Mr. 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  He presented to the emergency room at [facility] after he developed fevers and chills.  He stated that he had  been to [facility] and had some infusion done.  The fluid  declined on the left arm for his multiple sclerosis.  On getting home  that night, he started having fevers and chills and felt very unwell.  He did not have any sore throat, cold, cough, sputum production, or  hemoptysis.  He had no chest pain or shortness of breath.  He had been  discharged from the hospital about a week earlier after he was admitted  with pulmonary embolism, right axillary venous thrombosis related to a  PICC line on the right arm.  This was removed and then placed on the  left side to continue his outpatient infusions.  He was brought in to  the emergency room by his family members, who were concerned about his  fevers and chills, which he said was up to 102 at home.  At the  emergency room, he was afebrile but he was noted to be hypotensive with  systolic blood pressures in the 80s and 90s, but he responded well to IV  infusion of saline.  He was also very tachycardic up to 130s and 140s  heart rate, and this also came down with hydration.  Blood cultures were  obtained.  Chest x-ray and urine cultures were also obtained.  CT scan  of the abdomen, pelvis, and chest were done and there was no evidence of  any infection of focus.  He had some abrasions on his legs and knees,  which were not evidently infected.  He was started on intravenous Zosyn  3.375 g q.8 h. and admitted to the floor.   HOSPITAL COURSE:  On admission, the patient was continued on intravenous  fluids with normal saline at 100 mL an hour as well as intravenous Zosyn  0.375 g q.6 h.  His home medication were continued except for his blood  pressure med that were put on hold due to the hypotensive and  tachycardic episode.  His condition improved significantly during  hospitalization.  At the emergency room, his white count was 11, but on  the next day had dropped down to 6.6.  Blood cultures were followed  closely.  The left arm PICC line was removed and the tip was sent for  culture.  He was continued on IV Zosyn until results of blood  cultures  were available.  Blood culture on PICC catheter tip culture were  positive for Serratia marcescens, which was pansensitive except to  cefazolin.  The patient was therefore switched to oral ciprofloxacin and  he remained afebrile.  Today he feels fine and is eager to go home.   PHYSICAL EXAMINATION:  VITAL SIGNS:  His blood pressure of 133/86, heart  rate of 68 and regular, respiratory rate of 20, temperature 97.8, and O2  sats on room air is 95%.  GENERAL:  He is not pale.  He is not icteric.  He is not cyanosed.  He  is well hydrated.  CHEST:  Good air entry bilaterally with no rales, rhonchi, or wheezes.  ABDOMEN:  Obese, soft, nontender.  No masses.  Bowel sounds are present.  HEART:  Sounds 1 and 2 are heard with no murmurs.  No S3 gallops.  EXTREMITIES:  No edema, calf tenderness, or swelling.  CNS:  She is alert and oriented x3 with bilateral spastic paraplegia.   LABORATORY DATA:  White count of 6.6, hemoglobin 11.2, hematocrit 30.2,  platelet count of 314.  Sodium was 138, potassium 3.7, chloride  107,  bicarbonate of 25, BUN is 6, creatinine 1.03, glucose 138, lactic acid  was 2.2 which is within normal limits.  INR is 2.5, which is  therapeutic.  He is now considered stable for discharge to home.   DISCHARGE DIAGNOSES:  1. Serratia sepsis related to left arm percutaneously inserted central      line catheter that has now been discontinued.  The patient has      remained afebrile with normal white count on antibiotics.  He will      continue ciprofloxacin p.o. for another week.  2. Recent pulmonary embolism with right axillary venous thrombosis on      anticoagulation with Coumadin.  INR is therapeutic today.  3. Systemic hypertension.  Recent hypotensive episode, his blood      pressure medicines are currently on hold.  We will put on hold      until he is evaluated in the office.  4. Multiple sclerosis.  Currently stable.   His condition on discharge is stable.  His disposition is for home.   His discharge medications are:  1. Coumadin 5 mg p.o. daily.  2. Baclofen 20 mg p.o. t.i.d.  3. Neurontin 600 mg p.o. t.i.d.  4. Pamelor 25 mg p.o. nightly.  5. Plavix 75 mg p.o. daily.  6. Protonix 40 mg p.o. daily.  7. Skelaxin 400 mg p.o. t.i.d.  8. Zoloft 50 mg p.o. daily.  9. OxyContin 20 mg p.o. q. 12.  10.Percocet 5/325 one p.o. q.6 p.r.n. for pain.  11.Ciprofloxacin 500 mg p.o. b.i.d. for 7 more days.   This discharge plan has been discussed with him and his questions are  answered.      Fleet Contras, M.D.  Electronically Signed     EA/MEDQ  D:  03/12/2009  T:  03/13/2009  Job:  [id number]   cc:   Evie Lacks, MD

## 2011-03-25 NOTE — H&P (Signed)
NAMEPRATT, BRESS             ACCOUNT NO.:  [id number]   MEDICAL RECORD NO.:  [id number]          PATIENT TYPE:  INP   LOCATION:  5149                         FACILITY:  [facility]   PHYSICIAN:  Deanna Artis. Hickling, M.D.DATE OF BIRTH:  [birth year]   DATE OF ADMISSION:  06/01/2007  DATE OF DISCHARGE:                              HISTORY & PHYSICAL   CHIEF COMPLAINT:  Cannot walk, pain and numbness to the left thigh.   HISTORY OF THE PRESENT CONDITION:  Mr. Haselton is a 50 year old  gentleman with a longstanding history of multiple sclerosis that has  affected cervical spine greater than brain.  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  The patient has been followed by Dr. Orlin Hilding and then by Dr. Darrelyn Hillock at  [facility] and starting November 2007  by Dr. Avie Echevaria.   The patient was last seen in our office Mar 26, 2007.  At that point,  the patient had had 4 hard falls and struck his head.  He had noted  difficulty with spinning episodes primarily in the standing position  lasting for seconds without nausea or vomiting.  The patient did not  have any other symptoms.   Examination at that time showed evidence of severe spastic paraparesis  with 3/5 strength in his right leg and 3/5 strength proximally in the  left leg and 1/5 distally in his left foot.  He received CT scan of his  head which failed to show any acute abnormalities.   The patient also had laboratory recently to look for antibodies to  betaseron.  They were negative.  CBC was negative.  Comprehensive  metabolic panel showed slightly elevated glucose at 105 and an ALT at  59, but otherwise was normal.   Nine days ago the patient began having difficulty with ambulating beyond  what is baseline for him.  He complained of pain in his left thigh and  numbness.  He was on a trip to visit relatives which was supposed to be  a day trip,  as he was coming back from the trip the pain increased and  he asked to be taken to the nearest hospital which was [facility]  The patient was admitted and treated for exacerbation of his  MS with 3 days of pulse therapy with Solu-Medrol.  He was then placed on  a tapering dose of prednisone.  He requested that he be allowed to go  home and was discharged.  He had an MRI scan of the cervical and  thoracic spine which showed evidence of lesions.  I do not know if they  were acute or chronic.  Do not know whether they changed from previous  studies.   The patient made it home, but continued to have pain in his thigh.  He  was scheduled to be seen by Dr. Sandria Manly today but instead he came to the  emergency room in the early morning hours of June 01, 2007, where he was  evaluated by the emergency department and we  were contacted several  hours later to evaluate and admit the patient.   His review of systems is unremarkable for chest pain, shortness of  breath, headache.  He does have blurred vision in the left eye.  He  denies diplopia, dysarthria, dysphagia.  He has general weakness in his  legs and stiffness, pain in his left thigh, numbness around his left  thigh.  He says that he is incontinent of urine and stool but I believe  it is because he is not able to ambulate to get to the commode when he  has to go.   The patient was able to fill a urinal while here as a patient.   In the emergency department, the patient has had plain films of his  thoracic spine which do not show any abnormalities other than old  compression fracture at L1 and transitional vertebrae at S1.  The  patient has a mild amount of scoliosis.  There are no new lesions and no  subluxations.   White blood cell count 11,600, hemoglobin 13.0, hematocrit 38.7, MCV 82,  platelet count 319,000.  Neutrophils 82%, absolute granulocytes 9500,  lymphocytes 12, monocytes 5, eosinophils 0.  Urinalysis, specific   gravity 1.016, pH 8. Chemistries were negative.  Sodium 141, potassium  4.3, chloride 108, CO2 26, glucose 111, BUN 13, creatinine 0.89, calcium  8.5, total protein 6.2, albumin low at 2.7, AST 30, ALT elevated at 83.  Magnesium 2.4, alcohol level none detectable.  His urine drug screen is  positive for opiates and cocaine and tetrahydrocannabinol, sedimentation  rate elevated at 440.   PHYSICAL EXAMINATION:  On examination today, temperature 98.7, blood  pressure 103/44 resting, pulse 62, respirations 16, oxygen saturation  100%.  ENT:  Negative except decreased range of motion, cannot bring his left  ear to his left shoulder.  There are no bruits.  LUNGS:  Clear.  HEART:  No murmurs.  Pulses normal.  ABDOMEN:  Soft, protuberant.  Bowel sounds normal.  No  hepatosplenomegaly.  EXTREMITIES:  Swollen, tender left hip.  NEUROLOGIC:  Awake, alert without dysphagia, dysarthria.  He is in some  discomfort.  Cranial nerves, he complains of blurred vision in the left  eye.  Visual acuity 20/30 OD and 20/40 OS.  No afferent pupillary  defect.  Normal fundi.  Visual fields full to double simultaneous  stimuli.  Symmetric facial strength.  Midline tongue, uvula.  Air  conduction greater than bone conduction.  He slips midline to pinprick  with  left hip hypesthesia.  Motor examination, normal strength in the  upper extremities, 5/5.  Fine motor movements, right greater than left.  He cannot appose his left thumb to the left fifth digit.  There is no  drift.  He has severe spastic paraplegia, 3/5 proximally in the right  leg, 1/5 proximally in the left leg.  He wiggles his toes bilaterally,  3/5 distally in the right foot, 1-2/5 distally in the left foot.   He has functional sensory findings.  He slips midline on the left side  with hyperesthesia.  He has good stereognosis bilaterally.  He has  circumferential numbness in the left thigh, from the hip down to the  knee.  He has circumferential  spinal level anteriorly at approximately  T8.  He does not have this on his back.  His gait cannot be tested.  Cerebellar finger-to-nose was okay.  Deep tendon reflexes were normal in  the upper extremities, constrained in the lower extremities  by  spasticity.  He has no clonus.  He has bilateral sensory plantar  response.   IMPRESSION:  1. Multiple sclerosis exacerbation, 340.  I cannot tell what is new,      what is old on his MRI and have requested from [facility] the      images be sent by [employer].  Will check a Cervical spine.      We need to make certain that there is no fracture or subluxation.      There is nothing new in his thoracic or lumbosacral spine.  2. Evaluate for deep vein thrombosis of his left thigh.  Ultrasound,      will start intravenous heparin if positive, Lovenox if negative.  3. Paraplegia.  He will need physical therapy to help him with gait.      For his multiple sclerosis, he is just on Solu-      Medrol, so will place him on prednisone 60 mg a day, and cover his      stomach for gastrointestinal prophylaxis with Pepcid.  Will      increase the baclofen because of his spasticity.  I wonder if he      might be a good candidate for baclofen pump.      Deanna Artis. Sharene Skeans, M.D.  Electronically Signed     [facility]/MEDQ  D:  06/01/2007  T:  06/01/2007  Job:  [id number]   cc:   Lillia Carmel, M.D.

## 2011-03-25 NOTE — Discharge Summary (Signed)
NAMESHEPARD, KELTZ             ACCOUNT NO.:  [id number]   MEDICAL RECORD NO.:  [id number]          PATIENT TYPE:  INP   LOCATION:  1502                         FACILITY:  [facility]   PHYSICIAN:  Fleet Contras, M.D.    DATE OF BIRTH:  Jul 06, 1961   DATE OF ADMISSION:  09/20/2008  DATE OF DISCHARGE:  10/02/2008                               DISCHARGE SUMMARY   HISTORY OF PRESENTING ILLNESS:  Please see the dictated H and P for full  details of presentation.  In summary, Mr. Antenucci is a 50 year old  gentleman admitted via the emergency room with right-sided upper  abdominal and lower chest pain for about 2 or 3 days duration,  progressively getting worse, associated with cough and shortness of  breath.  He also had some fevers and chills.  At the emergency room, his  blood pressure was 143/74, heart rate was 130.  His O2 sats on  presentation was of 80% on presentation and laboratory data showed  sodium of 136, potassium 3.9.  D-dimer was 3.0.  BNP was less than 30.  Chest x-ray showed bilateral airspace disease due to possible pneumonia  and right-sided pleural effusion.  His white count at that time was  15.5.  He was therefore thought to have right lower lobe pneumonia with  pleural effusion and he was admitted to the step-down unit for further  evaluation.   HOSPITAL COURSE:  On admission, he was started on intravenous Zosyn.  CT  scan of the chest was performed to rule out pulmonary embolism due to a  prior history of pulmonary embolism and elevated D-dimer.  This was  negative but the CT scan did reveal bilateral lower lobe consolidation  and diffuse air space disease compatible with pneumonia.  There was no  PE.  He subsequently became more dyspneic requiring more oxygen with  labored breathing.  Pulmonary consult was requested and patient had to  be intubated for ventilator support.  He was also started on intravenous  vancomycin.  He did eventually develop high-grade fevers  with  temperature up to 102.  This subsequently improved.  He received tube  feeding via a Panda tube with Jevity 1.2 which he also tolerated.  Cultures for possible cause of pneumonia were negative for H1N1  influenza, strep pneumonia, Legionella, and other organisms.  Bronchoscopy lavage was also negative.  He was treated with 5 days of  Zithromax, as well as Tamiflu and once his fever subsided he was  converted to enteral Avelox which he completed on September 30, 2008.  On  September 28, 2008, he was able to be extubated and made significant  progress afterwards.  He is now tolerating a full diet, breathing for  himself without any oxygen, and feels ready to go home.  He is afebrile.  His blood pressure is 120/80.  Heart rate is 90, regular.  Respiratory  rate is 18.  Temperature 97.6.  O2 sats on room air is 94%.  He is not  pale.  He is not icteric.  He is not cyanosed.  His chest is clear to  auscultation.  Heart  sounds 1 and 2 are heard with no murmurs.  No S3  gallops.  Abdomen is obese, soft, nontender, no masses.  Bowel sounds  are present.  Extremities show no edema or calf tenderness.  CNS is  alert, oriented x3 with paraplegia due to multiple sclerosis.  His  latest laboratory data on September 30, 2008, shows a white count of  16.8, hemoglobin 11.9, hematocrit 35.9, platelet count of 530, sodium  was 139, potassium 3.9, chloride 110, bicarbonate of 26, BUN 20,  creatinine 0.87, and glucose of 110.  Chest x-ray showed no acute  infiltrates.  He is now considered stable for discharge home.   DISCHARGE DIAGNOSES:  1. Bilateral pneumonia with right pleural effusion.  2. Status post ventilator-dependent respiratory failure.  3. Systemic hypertension, well controlled, is currently off      medications.  4. Multiple sclerosis.  5. Leukocytosis, improving.  6. Polysubstance abuse.   CONDITION ON DISCHARGE:  Stable.   DISPOSITION:  For home.  He has had a PT and OT consultation  and  recommended for home PT and OT.  He is also to attend [facility] for drug rehabilitation.   DISCHARGE MEDICATIONS:  1. Baclofen 30 mg 4 times a day.  2. Plavix 75 mg daily.  3. Zoloft 50 mg daily.  4. Meloxicam 7.5 mg one to two daily.  5. Metoclopramide 10 mg 3 times a day p.r.n. before food.  6. Gabapentin 600 mg 3 times a day.  7. Oxybutynin 5 mg twice a day.  8. Nortriptyline 25 mg 2 at bedtime.  9. Topiramate 25 mg 2 at bedtime.  10.Lisinopril 10/12.5, is currently on hold.   He will follow up with me in office in 1 week.  This plan of care has  been discussed with him and his questions answered.      Fleet Contras, M.D.  Electronically Signed     EA/MEDQ  D:  10/02/2008  T:  10/02/2008  Job:  [id number]

## 2011-03-25 NOTE — H&P (Signed)
NAME:  [name], [name] NO.:  [id number]   MEDICAL RECORD NO.:  [id number]           PATIENT TYPE:   LOCATION:                                 FACILITY:   PHYSICIAN:  Ellwood Dense, M.D.   DATE OF BIRTH:  1961-04-22   DATE OF ADMISSION:  DATE OF DISCHARGE:                              HISTORY & PHYSICAL   ADMITTING PHYSICIAN:  Ellwood Dense, M.D.   NEUROLOGIST:  Dr. Sandria Manly.   HISTORY OF PRESENT ILLNESS:  Mr.  [name] is a 50 year old African-  American male African-American male with history of multiple sclerosis,  diagnosed in 2005.  He was on the rehabilitation unit September 2005 for  an exacerbation of MS and again in February 2007 for an L1 compression  fracture after a fall.   The patient was originally admitted to [facility], mid-July, for spasms of his legs and pain.  He reports that he  was in the area of [place], [place], and did not feel he  could make it back to [place] and, therefore, was hospitalized in  [place].  He reports that medication adjustments were made with  substantial worsening of his symptoms.  He was subsequently discharged  home.   The patient was admitted to [facility] June 01, 2007, with  increased spasms and decreased mobility.  Cranial CT was negative for  new acute changes.  MRI study of the cervical and thoracic spine showed  no new lesions.  Patient was placed on IV Solu-Medrol per Dr. Sandria Manly.  Venous Doppler studies and chest CT were negative.  He was noted to have  a history of pulmonary embolism in 2005, but no recurrence was noted.  He was noted to be positive on a urine drug screen for cocaine and  marijuana.  He was maintained on subcu Lovenox for DVT prophylaxis.  Recommendation was for a slow taper of prednisone and to continue  Baclofen for spasms.   The patient was evaluated by the rehabilitation physicians and felt to  be an appropriate candidate for inpatient  rehabilitation.   REVIEW OF SYSTEMS:  Positive for numbness and spasms.   PAST MEDICAL HISTORY:  1. History of multiple sclerosis, diagnosed in 2005.  2. History of pulmonary embolism in 2005.  3. Prior tonsillectomy.  4. Low testosterone level.   FAMILY HISTORY:  Noncontributory.   SOCIAL HISTORY:  Patient lives with his 67 year old mother and his wife.  His wife works during the day and during that time, the patient has a  home health aide five days per week, three hours per day.  That aide  helps him with household management, along with meal preparation and  personal care.  The patient's mother uses a cane, but can assist as  necessary.  They all live in a one-level home with a ramp.  The patient  does have a history of ethanol abuse and apparently does use illegal  drugs, including cocaine and marijuana, according to the urine drug  screen.   FUNCTIONAL HISTORY PRIOR TO ADMISSION:  Independent, using a cane and  walker  inside the home, but a wheelchair outside the home.  He does have  a left lower extremity ankle foot orthosis.   ALLERGIES:  PENICILLIN.   MEDICATIONS ON ADMISSION:  1. Baclofen 10 mg t.i.d.  2. Neurontin 600 mg t.i.d.  3. Plavix 75 mg daily.  4. Pamelor daily.  5. Lunesta q.h.s.   LABORATORY:  Recent hemoglobin was 13 with hematocrit of 38, platelet  count of 319,000 and white count of 11.  Recent sodium was 141,  potassium 4.3, chloride 108, CO2 26, BUN 13 and creatinine 0.8.   PHYSICAL EXAM:  Well-appearing, well-nourished, adult male, seated on  the edge of the bed, in no acute discomfort.  VITAL SIGNS:  Blood pressure 120/70 with the pulse 80, respiratory rate  18 and temperature 98.  HEENT:  Normocephalic, atraumatic.  CARDIOVASCULAR:  Regular rate and rhythm.  S1, S2 without murmurs.  ABDOMEN:  Soft, slightly obese, nontender, with positive bowel sounds.  LUNGS:  Clear to auscultation bilaterally.  NEUROLOGIC:  Alert and oriented times three.   Cranial nerves II through  XII are intact.  Bilateral upper extremity exam showed 5/5 strength  throughout.  Bulk and tone were normal and reflexes were 2+ and  symmetrical.  Sensation was intact to light touch throughout the  bilateral upper extremities.  Lower extremity exam showed hip flexion at  3-/5 bilaterally.  Knee extension was 3-/5 bilaterally and ankle  dorsiflexion was 0-1/5 bilaterally.  Sensation was decreased to light  touch throughout the bilateral lower extremities.   IMPRESSION:  Status post multiple sclerosis exacerbation with  continuation of prednisone taper after treatment with IV Solu-Medrol.   Presently, the patient has deficits in ADLs, transfers and ambulation,  related to the above-noted multiple sclerosis exacerbation.   PLAN:  1. Admit to the rehabilitation unit for daily therapies, to include      physical therapy for range of motion, strengthening, bed mobility,      transfers, pre-gait training, gait training and equipment      evaluation.  2. Occupational therapy for range of motion, strengthening, ADLs,      cognitive/perceptual training, splinting and equipment evaluation.  3. Rehab nursing for skin care, wound care, and bowel and bladder      training as necessary.  4. Speech therapy for oral motor exercises as necessary and higher-      level cognition.  5. Case management to assess home environment, assist with discharge      planning and arrange for appropriate followup care.  6. Social worker to assess family and social support, assist in      discharge planning.  7. Regular diet.  8. Check admission labs, including CBC and CMET Monday, June 07, 2007.  9. Neurontin 600 mg p.o. t.i.d.  10.Ambien 10 mg p.o. q.h.s.  May refuse.  11.Pamelor 10 mg p.o. q.h.s.  12.Pepcid 40 mg p.o. daily.  13.Baclofen 30 mg, alternating with 20 mg every 6 hours.  14.Zanaflex 2 mg p.o. t.i.d.  15.Subcu Lovenox 40 mg daily for DVT prophylaxis.  16.Vitamin D 400  international units p.o. b.i.d.  17.Os-Cal 500 mg p.o. b.i.d.  18.Betaseron 0.3 mg subcu every other day (patient's own supply).  19.Old EKG to chart.  20.Routine turning to prevent skin break-down.  21.Dulcolax suppository one per rectum daily p.r.n.  22.Prednisone 60 mg p.o. daily times 2 additional days, then 40 mg      p.o. daily times 4 days, then 20 mg p.o. daily times 5 days, then  stop.  23.Right ankle foot orthosis per [facility] as ordered on unit 5100 and      place modified left ankle foot orthosis.  24.Draw blood from IV.  25.Discontinue PICC line, once discharged from rehab unit.   PROGNOSIS:  Good.   ESTIMATED LENGTH OF STAY:  Five to ten days.   GOALS:  Modified independent short distance household ambulation, use a  walker or single-point cane with modified independent wheelchair  mobility.           ______________________________  Ellwood Dense, M.D.     DC/MEDQ  D:  06/04/2007  T:  06/04/2007  Job:  [id number]

## 2011-03-25 NOTE — Discharge Summary (Signed)
Gary, Perez NO.:  [id number]   MEDICAL RECORD NO.:  [id number]          PATIENT TYPE:  IPS   LOCATION:  4029                         FACILITY:  [facility]   PHYSICIAN:  Ellwood Dense, M.D.   DATE OF BIRTH:  1961/03/09   DATE OF ADMISSION:  06/04/2007  DATE OF DISCHARGE:  06/10/2007                               DISCHARGE SUMMARY   DISCHARGE DIAGNOSES:  1. Multiple sclerosis with exacerbation.  2. Subcutaneous Lovenox for deep vein thrombosis prophylaxis.  3. History of pulmonary emboli in 2005.  4. History of lumbar compression fracture after a fall in 2007.   HISTORY OF PRESENT ILLNESS:  This is a 50 year old black male with  history of multiple sclerosis diagnosed 2005.  He received inpatient  rehab services for exacerbation of multiple sclerosis and again in 2007  for a lumbar L1 compression fracture after a fall.  Admitted June 01, 2007, with increased spasms and limited mobility.  Cranial CT scan  negative for acute changes.  MRI of cervical and thoracic spine negative  for lesions.  CT of the chest negative with noted history of pulmonary  emboli in 2005.  Venous Doppler studies lower extremities negative.  Placed on intravenous Solu-Medrol per Dr. Sandria Manly.  Noted urine drug screen  positive for cocaine and marijuana use.  He was maintained on  subcutaneous Lovenox for deep vein thrombosis prophylaxis.  He was  completing a prednisone taper.   PAST MEDICAL HISTORY:  See Discharge Diagnoses.   ALLERGIES:  PENICILLIN.   SOCIAL HISTORY:  Lives with a 28 year old mother and wife.  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  His wife works day shift.  Mother uses a cane but can assist.  They live in a one-level home with a  ramp.   MEDICATIONS PRIOR TO ADMISSION:  Baclofen, Neurontin, Plavix, Pamelor,  Lunesta, and prednisone taper.   REHABILITATION HOSPITAL COURSE:  The patient was admitted to inpatient  rehab services with therapies  initiated on a 3-hour daily basis  consisting of physical therapy, occupational therapy and rehabilitation  nursing. The following issues were addressed during the patient's  rehabilitation stay.  Pertaining to Mr. [name] multiple sclerosis  with exacerbation, he was completing a prednisone taper.  He remained on  Betaseron every morning per Dr. Sandria Manly.  He was supervision for his basic  mobility, simple set up for activities of daily living.  Home health  therapies would be ongoing.  He was still having some spasms.  He was on  30 mg every 12 hours of baclofen as well as Zanaflex 2 mg three times  daily.  Pain control with Neurontin 600 mg three times daily.  He  remained on subcutaneous Lovenox for deep vein thrombosis prophylaxis  throughout his rehab course.  Note his venous Doppler studies were  negative for deep vein thrombosis.  Overall, his strength and endurance  had greatly improved as he was encouraged with his overall progress and  discharged to home.   Latest labs showed a sodium 139, potassium 3.7, BUN 13, creatinine 0.8.  Hemoglobin of 12.6, hematocrit 38, platelet 345,000.   DISCHARGE MEDICATIONS AT TIME OF DICTATION:  1. Os-Cal 500 mg twice daily.  2. Prednisone 20 mg daily x5 days.  3. Baclofen 30 mg every 12 hours.  4. Zanaflex 2 mg three times daily.  5. Neurontin 600 mg three times daily.  6. Pepcid 40 mg daily times 5 days then stop  7. Pamelor 10 mg at bedtime.  8. Vitamin D 400 units twice daily.  9. Betaseron 0.3 mg subcutaneously every 48 hours.  10.Zoloft 25 mg daily.   ACTIVITY:  As tolerated.   DIET:  Regular.   SPECIAL INSTRUCTIONS:  No drinking, no driving, no smoking.  It was  discussed at length, no illicit drug use.   FOLLOWUP:  1. Dr. Sandria Manly, neurology services. Call for appointment.  2. Dr. Ellwood Dense, rehab services as needed.      Mariam Dollar, P.A.    ______________________________  Ellwood Dense, M.D.    DA/MEDQ  D:   06/08/2007  T:  06/08/2007  Job:  [id number]   cc:   Evie Lacks, MD

## 2011-03-25 NOTE — Consult Note (Signed)
NAMECAROLOS, [name] NO.:  [id number]   MEDICAL RECORD NO.:  [id number]          PATIENT TYPE:  EMS   LOCATION:  MAJO                         FACILITY:  [facility]   PHYSICIAN:  Bevelyn Buckles. Champey, M.D.DATE OF BIRTH:  1961/10/26   DATE OF CONSULTATION:  DATE OF DISCHARGE:  07/05/2007                                 CONSULTATION   REASON FOR VISIT:  Headache/migraine.   HISTORY OF PRESENT ILLNESS:  Mr. Capraro is a 50 year old African  American male with past medical history of MS who presents with a one  and a half week history of headaches which has been fairly constant.  Last evening upon getting up he states he had increased severity of the  headache.  He has had it mostly over the right frontal head region as  described as throbbing and pressure, 8 to 9 out of 10 intensity without  any radiation.  The patient has nausea associated with his headache.  Denies any photophobia or phonophobia.  The patient does have some  residual left-sided lower extremity weakness, unsteadiness secondary to  his MS.  He also complains of slight blurry vision.  Recently he was  admitted with an MS flare in July 2008 and was given steroids.  He  denies any new weakness, numbness, speech changes, swallowing problems,  chewing problems, or loss of consciousness.   PAST MEDICAL HISTORY:  Positive for multiple sclerosis.   CURRENT MEDICATIONS:  1. Gabapentin.  2. Baclofen.  3. OxyContin.  4. Detrol.  5. Betaseron.   ALLERGIES:  PENICILLIN.   SOCIAL HISTORY:  The patient lives with his mother, brother.  Does smoke  a half a pack per day.  He socially drinks alcohol.   FAMILY HISTORY:  Noncontributory.   REVIEW OF SYSTEMS:  Positive as per HPI.  Negative as per HPI of other  systems examined.   PHYSICAL EXAMINATION:  VITAL SIGNS:  Temperature is 98.4, pulse is 83,  blood pressure 123/73, respirations 20.  O2 saturation is 96%.  HEENT:  Normocephalic, atraumatic.  Extraocular  muscles are intact.  Pupils equal, round, reactive to light.  NECK:  Supple.  No carotid bruits.  HEART:  Regular.  LUNGS:  Clear.  ABDOMEN:  Soft, nontender.  EXTREMITIES:  Show good pulses.  NEUROLOGICAL:  The patient is awake, alert .  Language is fluent.The  patient occasionally has stuttered speech.  Cranial nerves II through  XII are grossly intact.  Motor examination shows 4+/5 strength  bilaterally in upper extremities.  She has 3 to 4 over 5 strength in the  right lower extremity, 2 to 3 over 5 strength in the left lower  extremity. Sensation examination is within normal limits.  Light touch  reflexes are 2+ throughout.  Toes are neutral bilaterally.  Cerebellar  function is within normal limits.  Normal finger-to-nose.  Gait was not  assessed secondary to his safety.   LABORATORY DATA:  WBC is 15.0, hemoglobin 12.6, hematocrit 38.0,  platelets 345.  Sodium 139, potassium 3.7, chloride 104, CO2 29, BUN 13,  creatinine 0.89, glucose 99.  AST is 19, ALT 47, albumin  2.8, calcium is  9.2.   CT of the head showed no acute abnormalities.  MRI of the brain is  currently pending.   IMPRESSION:  This is a 50 year old African American male with multiple  sclerosis and a headache for over 1 week.  The headache sounded  migrainous in character.  Will await the MRI of the brain to evaluate  further.  Will give the patient Depacon 1 gram intravenous and Reglan in  the ED.  The patient has already been given Solu-Medrol and OxyContin in  the emergency room with some temporary benefit.  Will await MRI and  Depacon results with probably discharge home with follow up with Dr.  Sandria Manly in a few weeks' time.  Will follow up on an MRI scan.      Bevelyn Buckles. Nash Shearer, M.D.  Electronically Signed     DRC/MEDQ  D:  07/05/2007  T:  07/05/2007  Job:  [id number]

## 2011-03-25 NOTE — Discharge Summary (Signed)
[name], [name]             ACCOUNT NO.:  [id number]   MEDICAL RECORD NO.:  [id number]          PATIENT TYPE:  IPS   LOCATION:  NA                           FACILITY:  [facility]   PHYSICIAN:  Genene Churn. Love, M.D.    DATE OF BIRTH:  [birth year]   DATE OF ADMISSION:  06/01/2007  DATE OF DISCHARGE:  06/04/2007                               DISCHARGE SUMMARY   REASON FOR ADMISSION:  This is one of multiple [facility]  admissions for this 50 year old right-handed African American male with  a known history of multiple sclerosis admitted from the emergency room  for evaluation of increased weakness in his lower extremities.   HISTORY OF PRESENT ILLNESS:  Mr. [name] has a known history of  relapsing and remitting multiple sclerosis.  He has had longstanding  weakness in his lower extremities with a documented myelopathy from his  multiple sclerosis.  His has also had difficulty with lower back pain  and knee pain and has been followed by orthopedics in [place].  He  has had an acute compression fracture of T1 in the past for which he  received morphine medication.  He has been on longstanding Vicodin  because of leg pain.  He has been on Betaseron and for his MS.  He was  first seen by Dr. Marcelino Freestone in December 22, 2003, and  subsequently followed by Dr. Venia Minks at [facility] for multiple sclerosis and since Mar 18, 2006, I  have been following him as an outpatient in [facility].  He has had documented lesions of his cervical spine August 30, 2004, and his brain February 20, 2004.  He was on Betaseron initially,  Rebif, and then switched back to Betaseron.  He has had left eye visual  loss as well as his spasticity.  He has had Interferon antibodies which  were negative on February 17, 2007.  In November 2007, he was admitted to  [facility] system in [place], [place] for bilateral lower extremity  spasticity.  He has had blood studies in November 2007 with normal CBC,  ANA, uric acid, RA latex fixation and comprehensive metabolic panel, but  slightly elevated SGPT of 58 which has been present for some time.  He  was in his usual state of health and has been having some recent falls.  In mid July 2008.  He began having difficulty ambulating beyond his  baseline and began complaining of left thigh pain and numbness, and he  was on a trip to visit relatives.  As he was coming back, from his trip,  his pain increased and he stopped at [place], [place], [facility] where he was admitted for an exacerbation of MS and  received 3 days of pulse therapy with Solu-Medrol.  He was then placed  on a tapering course of prednisone, but requested that he go home and be  discharged.  He had MRI studies of the brain, cervical and thoracic  spine.  He had a CT scan which was normal.  He had  MRI showed decreased  signal at T1 signal in the cervical spinal cord at level C2, C3, C4,  mainly seen in the posterior and lateral aspects of cord at C6-7.  There  Was mild bulge with focal central herniation and mild thecal sac peak  compression.  There was mild bilateral neural foramen stenosis at that  level.  In the thoracic region, a focal area of increased T2 stir was  noted and decreased T1 was seen in the right posterior lateral aspect of  the spinal cord at the level of T1-T2 in the lumbar region.  There was a  limited evaluation of the lumbar vertebral bodies that were thought to  demonstrate good height.  The conus medullaris was at the normal level.  There was mild disk bulges throughout the lumbar spine, most pronounced  at the L4-5 with mild central stenosis at that level.  He had  degenerative changes at the C6-7 level.  His MRI study of the brain was  not reported.  He came to [facility] and was admitted by Dr.  Sharene Skeans on the 22nd.   PAST MEDICAL HISTORY:   Significant for pulmonary embolism in 2005,  tonsillectomy, multiple sclerosis, compression fracture, and bilateral  knee pain.   MEDICATIONS:  At the time of admission included:  1. A tapering course of prednisone.  2. Betaseron 0.3 mg every other day.  3. Baclofen 20 mg initially t.i.d.  4. Lunesta 3 mg q.h.s.  5. Nortriptyline 10 mg q.h.s.  6. Hydrocodone 7.5/500 one q.6 hours p.r.n. pain.   ALLERGIES:  NO KNOWN DRUG ALLERGIES.   PHYSICAL EXAMINATION:  GENERAL:  Examination revealed a well-developed,  pleasant black male.  VITAL SIGNS:  Blood pressure 103/44, pulse 63, respiratory rate 16,  saturation was 100%, temperature was 98.7.  MENTAL STATUS:  He was alert and oriented x3, followed two and three-  step commands.  There was a mild dysarthria.  His cranial nerves  examination revealed some blurred vision in his left eye, acuity was  20/30 in the right 20/40 in the left.  There was no Marcus Gunn pupil.  There was no definite optic atrophy.  His tongue was midline.  The uvula  was midline and gags were present.  He had a slight decrease in the  right nasolabial fold.  His motor examination revealed good strength in  the upper extremities except for some clumsiness of left hand and arm.  He had better strength in his right arm than the left arm, but they were  both very good.  He could not oppose his left thumb to the left fifth  finger.  In the lower extremities, he had severe increased tone worsens  left leg than the right with approximately 2-3/5 strength proximally in  his right leg and 1-2/5 strength distally in his right foot and 3/5  proximally in his right leg and 3/5 distally his right leg, and he was  approximately 2/5 proximally in his left leg and 1-2/5 distally in his  left leg.  Deep tendon reflexes were increased, but his plantar  responses were downgoing.  Sensory examination was relatively intact.   LABORATORY DATA:  White blood cell count 11,600, hemoglobin  13.0,  hematocrit was 38.7, platelet count 319 K.  Sodium 141, potassium 4.3,  chloride 108, CO2 content 26, BUN 13, creatinine 6.89, glucose 11, AST  30, ALT elevated 83, alk phos 50, total bilirubin was 0.7, albumin 2.7,  calcium 8.5, magnesium 2.4.  Urinalysis  was negative.  Estimated sed  rate was 30.  Occult blood testing was negative.  Drugs of abuse  revealed positive cocaine, positive opiates, positive marijuana.  Doppler study of the veins was negative.  CT scan of the chest showed  minimal right lower lobe atelectasis and no evidence of PE.  Chest x-ray  showed right PICC line in place, hip x-rays and left femur x-rays were  negative.  Thoracic x-ray showed mild scoliosis, no acute abnormality.  Lumbar spine x-ray showed old compression fracture at L1.   HOSPITAL COURSE:  The patient was seen by physical therapy.  He was able  to ambulate approximately 150 feet with heavy reliance on railing and  walker, had decreased weightbearing  in his left lower extremity and it  was felt that he was a good candidate for CIR.  He was seen in  consultation by a rehab service, and it was recommended that he go to  rehab for further evaluation.  In the hospital, his spasms improved.  His Baclofen was increased and his Tizanidine was started.   IMPRESSION:  1. Multiple sclerosis (340).  2. Cocaine use (305.61)  3. Lower extremity pain (739.5).  4. Pulmonary emboli.  5. Old compressible fracture at L1.   PLAN:  Discharge the patient to the rehab service on:  1. Prednisone 60 mg per day for another 2 days and 40 mg per day x4      days, and 20 mg per day for 5 days then discontinue.  2. Ambien 10 mg p.o. q.h.s.  3 . Lioresal 20 mg, alternating with 30 mg on q. 6-hour basis.  1. Gabapentin 600 mg t.i.d.  2. Betaseron 0.3 mg subcu every other day.  3. Os-Cal 500 mg with vitamin D p.o. b.i.d.  4. Pamelor 10 mg q.h.s.  5. Pepcid 40 mg daily.  6. Vitamin D 4 IU b.i.d.  7. Zanaflex 2 mg  t.i.d.   He is discharged to the rehab service improved from his prehospital  status with final diagnosis of multiple sclerosis with myelopathy (code  340 and 721.43).           ______________________________  Genene Churn. Sandria Manly, M.D.     JML/MEDQ  D:  06/04/2007  T:  06/04/2007  Job:  [id number]

## 2011-03-25 NOTE — Consult Note (Signed)
[name], [name] NO.:  [id number]   MEDICAL RECORD NO.:  [id number]          PATIENT TYPE:  INP   LOCATION:  5122                         FACILITY:  [facility]   PHYSICIAN:  Adolph Pollack, M.D.DATE OF BIRTH:  1961/03/29   DATE OF CONSULTATION:  02/17/2009  DATE OF DISCHARGE:                                 CONSULTATION   PHYSICIAN REQUESTING:  Chriss Driver, MD   EMERGENCY DEPARTMENT REASON:  Abdominal pain gallstones.   HISTORY:  This 50 year old male reports having a 48-hour history of  upper abdominal pain and right upper quadrant pain.  No nausea,  vomiting, or diarrhea.  He has had some fever recently as well.  He  says, he does not have much of an appetite.  He presented to the  emergency department, February 16, 2009, at approximately 9:00 p.m. and was  evaluated.  He was noted to have a leukocytosis.  He underwent some  plain abdominal films, which were unremarkable.  He was noted to have  very slight elevation of his SGOT and SGPT, and some mild epigastric,  right upper quadrant and left upper quadrant tenderness.  An abdominal  ultrasound was ordered, which demonstrated multiple gallstones, but thin-  walled gallbladder with no pericholecystic fluid, no sonographic Murphy  sign, or no evidence of an inflammatory process.   Of note, was that in November, he had a similar episode, had a CT scan  of the abdomen at that time did not show any gallbladder pathology.  He  was empirically treated with antibiotics and improved without obvious  known source.  H1N1 was ruled out according to the discharge summary.  Because of the ultrasound findings, I have been asked to see him.   PAST MEDICAL HISTORY:  1. Multiple sclerosis, treated by Dr. Avie Echevaria.  2. Hypertension.  3. Chronic pain syndrome, on chronic narcotics.  4. Pulmonary embolism.  5. Depression.   PREVIOUS OPERATIONS:  Tonsillectomy.   ALLERGIES:  None known.   MEDICATIONS:  Baclofen,  gabapentin, nortriptyline, Plavix, Betaseron,  OxyContin, Percocet, Capoten, Zoloft, omeprazole, Skelaxin, lisinopril -  hydrochlorothiazide, and Phenergan.   SOCIAL HISTORY:  He has a former smoker history with occasional alcohol  use.   REVIEW OF SYSTEMS:  CARDIOVASCULAR:  No known heart disease.  PULMONARY:  No asthma or pneumonia.  GI:  No peptic ulcer disease, hepatitis,  melena, or hematochezia.  GU:  Denies kidney stones.  Hematologic  reports that he had a deep venous thrombosis and pulmonary embolism.  He  reports, he was on Coumadin, but now is on Plavix.   PHYSICAL EXAMINATION:  GENERAL:  Well-developed and well-nourished male,  in no acute distress who was belligerent at times during the history and  physical.  VITAL SIGNS:  Temperature is 97 degrees with a maximum temperature of  100.8, blood pressure is 125/89, pulse 100, and O2 saturations 96% on  room air.  EYES:  Extraocular motions intact.  No icterus.  RESPIRATORY:  Breath sounds equal and clear.  Respirations, unlabored.  CARDIOVASCULAR:  Slightly increased rate.  Regular rhythm.  ABDOMEN:  Soft.  There is  some right upper quadrant and epigastric  tenderness, and mild left upper quadrant tenderness, but no Murphy sign  or no guarding.  No palpable masses present.  No hernias noted.  EXTREMITIES:  There is a right upper extremity PICC line (the patient  states this was placed about approximately 10 days ago for intravenous  therapy for his multiple sclerosis).  There is slight erythema at the  insertion site.   Laboratory data demonstrates a white count of 19,400, hemoglobin 15.3,  and platelet count 190,000.  INR 1.1.  Potassium slightly low at 3.4.  Alkaline phosphatase, total bilirubin, and lipase within normal limits.  SGOT 49 and SGPT 66.  Albumin 2.6.  Urinalysis negative.   Abdominal x-rays no free air or distant bowel loops.   IMPRESSION:  1. Upper abdominal pain with fever and cholelithiasis.   History      suggest biliary origin.  Exam is suggestive also cholecystitis,      however ultrasound suggest cholelithiasis without obvious evidence      of acute inflammatory changes.  He had a similar episode back in      November, etiology unknown.  2. Multiple medical problems.   PLAN:  I explained to the patient that I want to confirm the diagnosis  with a HIDA scan to make sure this is consistent with acute  cholecystitis given the unclear picture back in November.  At this  point, he became extremely belligerent.  I had discussed with the  patient that the treatment of gallbladder disease would be  cholecystectomy, but we would not be able to do that emergently given  the fact that he is on Plavix and would likely have to admit him and put  him on IV antibiotics.  He continued to be fairly belligerent and  insisted that he had no other health problems, only the problem that he  felt that the gallstones were the definite source of his current  illness.  I said I do not disagree with him, but I just wanted to  confirm the diagnosis.  I spoke with the emergency department physician  about this and he was in complete agreement, eventually the patient did  agree.  I recommended to the emergency department physician, but because  of the patient's multiple medical problems, he would be minute to the  medical service as he was last time in November and that we would  proceed with the HIDA scan.  If the HIDA scan is not consistent with  acute cholecystitis, then I would recommend a CT scan be done.      Adolph Pollack, M.D.  Electronically Signed     TJR/MEDQ  D:  02/17/2009  T:  02/18/2009  Job:  [id number]   cc:   Fleet Contras, M.D.

## 2011-03-25 NOTE — H&P (Signed)
[name], GUARINO             ACCOUNT NO.:  [id number]   MEDICAL RECORD NO.:  [id number]          PATIENT TYPE:  INP   LOCATION:  1227                         FACILITY:  [facility]   PHYSICIAN:  Fleet Contras, M.D.    DATE OF BIRTH:  August 28, 1961   DATE OF ADMISSION:  09/21/2008  DATE OF DISCHARGE:                              HISTORY & PHYSICAL   PRESENTING COMPLAINT:  Right-sided upper abdomen and lower right chest  pain.   HISTORY OF PRESENT ILLNESS:  Mr. [name] is a 50 year old African  American gentleman with past medical history significant for multiple  sclerosis, degenerative arthritis of the hips, knees and shoulders,  depression, systemic hypertension and chronic pain syndrome.  He  presented to the emergency room at [facility] with 2-3 days  history of progressively worsening pain involving the right chest and  the upper abdomen on the same side without any history of trauma.  He  felt that the area was swollen, but there was no bruising.  The pain was  sharp, persistent, nonradiating and associated with a dry cough.  He had  no recent cold or congestion, but he stated that he has been having some  fevers and chills.  He did not have any nausea, vomiting, diarrhea or  constipation.  He denies any urinary symptoms.  He had some  progressively worsening shortness of breath especially with any form of  exertion.  He is more or less wheelchair bound because of his multiple  sclerosis and has mainly left-sided lower extremity weakness.  At the  emergency room on presentation, he was noted to be in moderate-severe  painful distress.  His initial vital signs shows oxygen saturation at  about 81%, this improved to 98% on 50% Ventimask.  Initial laboratory  data showed elevated white count at 15,000.  The chest x-ray showed low  lung volumes with bibasilar airspace opacities greater on the right side  with possibility of right effusion.  In view of his respiratory  distress  and pain, he was admitted to the intensive care unit for further  evaluation and appropriate therapy.  A CT scan of the chest could not be  performed because of elevated creatinine at 1.8.   PAST MEDICAL HISTORY:  1. Multiple sclerosis, apparently diagnosed in 2005.  2. Degenerative arthritis involving the hips, knees and shoulders.  He      is under the care of Dr. Prince Rome.  3. Chronic pain syndrome resulting from the painful arthritis and he      has been on chronic narcotic therapy.  4. Depression.  5. Systemic hypertension.  6. History of pulmonary embolism in 2005.   PAST SURGICAL HISTORY:  Tonsillectomy.   MEDICATIONS:  1. He is on baclofen 30 mg 4 times a day.  2. Plavix 75 mg once a day.  3. Sertraline 50 mg daily.  4. Metoclopramide 10 mg t.i.d. p.r.n.  5. Meloxicam 7.5 mg 1-2 p.o. daily.  6. Gabapentin 600 mg t.i.d.  7. Lisinopril/HCT 10/12.5 once a day.  8. Oxybutynin 5 mg 1 p.o. b.i.d.  9. Nortriptyline 25 mg at bedtime.  10.Topiramate 25 mg 2 at bedtime.  11.He recently received some intravenous infusion of a new medication      prescribed by Dr. Sandria Manly, neurologist for his multiple sclerosis on      September 12, 2008.  He stated that his symptoms started after this      infusion   ALLERGIES:  NO KNOWN DRUG ALLERGIES.   SOCIAL HISTORY:  He currently lives with his brother and his mother.  He  is disabled from any work-related activities.  He smokes about 1 pack of  cigarettes per day and denies any daily use of alcohol.  He apparently  has a history of cocaine and marijuana use in the past.   REVIEW OF SYSTEMS:  Essentially as above.  CNS:  He has no worsening of  his MS symptoms in terms of weakness, syncope, seizures or weakness of  extremities.  CVS:  He does have a right-sided chest pain, but there is  no orthopnea, PND or palpitations.  RESPIRATORY: As above.  GI:  As  above.  GU:  No dysuria, frequency or hematuria.  MUSCULOSKELETAL:  He  has  chronic pains in his hips, knees, shoulders and lower back for which  he has been on OxyContin and Percocet in the past.   PHYSICAL EXAMINATION:  GENERAL:  This is a middle-aged gentleman lying  on the hospital bed.  He is not in any respiratory distress at this  time, but he is in some pain.  He is not pale.  He is afebrile.  He is  not icteric.  He is not cyanosed.  VITAL SIGNS:  Blood pressure is 143/74, heart rate is 130 regular,  respiratory rate is 24, O2 sats on 50% Ventimask is 90%.  NECK:  Supple with no elevated JVD.  No cervical lymphadenopathy and no  carotid bruit.  CHEST:  Shows reduced air entry in both bases with a few crackles on the  right base.  HEART:  Sounds 1 and 2 are heard with tachycardia.  There are no rubs or  murmurs.  ABDOMEN:  Obese, soft.  There is tenderness over the right  upper quadrant and the right lower chest.  There are no obvious masses.  No deformity or swelling.  Bowel sounds are present.  EXTREMITIES:  Show no edema or calf tenderness.  Peripheral pulses are  present bilaterally.  CNS:  He is alert and oriented x3.  There is normal strength in both  upper extremities.  There is power of 3/5 in the lower extremities  proximally and 0-1/5 in the distal lower extremities.   LABORATORY DATA:  Sodium is 136, potassium 3.9, chloride 105,  bicarbonate is 21, BUN is 17, creatinine is 1.4, glucose is 151, calcium  9.0, AST was 37, ALT 55, alkaline phosphatase 82, total bilirubin is  0.8, WBC is 15.5, hemoglobin 14.1, hematocrit 42.7, platelet count of  258, D-dimer is 3.0, BNP is less than 30, pH is 7.38, PCO2 35.89, PO2  61.8 and oxygen saturation of 90.6, this is on 50% Ventimask.  Initial  point of care troponin is less than 0.05, MB 1.2 and myoglobin is 164.   DIAGNOSTICS:  1. Chest x-ray shows right basal airspace disease with possible      pneumonia or infiltrates and right-sided effusion.  2. EKG is currently not available for review.    ASSESSMENT:  [name] is a 50 year old gentleman admitted via the  emergency room with right-sided upper abdomen and lower chest pain about  2-3 days duration, progressively worsening and associated with cough and  shortness of breath.  Due to his prior history of pulmonary embolism, he  is on intravenous heparin until CT scan of the chest can be obtained to  evaluate him further and also to evaluate the pleural effusion.  He  remains borderline hypoxic and tachycardiac, and an intensive care  consult to be requested.   ADMISSION DIAGNOSES:  1. Right-sided upper abdominal and lower right chest pain.  2. Right lower lobe pneumonia with pleural effusion.  3. Rule out pulmonary embolism.  4. Respiratory failure requiring 50% Ventimask.  5. History of chronic pain syndrome with narcotic therapy.  6. History of tobacco abuse.  7. History of multiple sclerosis.   PLAN OF CARE:  He will be continued on intravenous Zosyn 2.25 gm q.8 h.  A CT scan of the abdomen and chest to be obtained for further evaluation  of his pain.  Pulmonary critical care consult will be requested.  He  will be on IV morphine 2-4 mg q.4 h. p.r.n. for severe pain and IV  Zofran 4 mg q.4 h. for nausea and vomiting.  Continue intravenous  heparin for possible pulmonary embolism until this is ruled out.  This  plan of care has been discussed with him and his questions answered.      Fleet Contras, M.D.  Electronically Signed     EA/MEDQ  D:  09/21/2008  T:  09/21/2008  Job:  [id number]

## 2011-03-25 NOTE — Discharge Summary (Signed)
NAMESAHEJ, SCHRIEBER             ACCOUNT NO.:  [id number]   MEDICAL RECORD NO.:  [id number]          PATIENT TYPE:  INP   LOCATION:  1610                         FACILITY:  [facility]   PHYSICIAN:  Fleet Contras, M.D.    DATE OF BIRTH:  [birth year]   DATE OF ADMISSION:  02/25/2009  DATE OF DISCHARGE:  03/03/2009                               DISCHARGE SUMMARY   HISTORY OF PRESENT ILLNESS:  Please see the dictated H and P for full  details of presentation.  In summary, [name] is a 50 year old  African American gentleman with multiple medical problems including  multiple sclerosis; degenerative joint disease of the hips, knees, and  shoulders; major depression; systemic hypertension; chronic pain  syndrome; and history of pulmonary embolism in 2005.  He recently  admitted to the hospital after he had a laparoscopic cholecystectomy for  cholelithiasis and acute cholecystitis and he returned to the hospital  about a week later with swelling of both legs and episode of shortness  of breath.  At the emergency room, he was well hydrated, alert, awake,  and oriented.  His chest exam showed decreased air entry bilaterally at  the bases and he had a 1+ pitting edema of the lower legs and feet.  Due  to previous history of pulmonary embolism, a CT scan of the chest was  performed and this did reveal a right lower lobe pulmonary embolus, was  therefore admitted to the hospital for anticoagulation and close  monitoring.   HOSPITAL COURSE:  On admission, he was started on intravenous heparin to  overlap with p.o. Coumadin.  Venous Doppler of the lower extremities was  negative for deep venous thrombosis.  He had a PICC line on his right  arm and a venous Doppler of the right arm did reveal a right axillary  vein thrombosis, which is presumed to be the source of the embolus to  the lungs.  He has now completed 5 days of heparin overlapped with  Coumadin and has had no further shortness of  breath, chest pain, or  hemodynamic compromise.  His hypercoagulable panel essentially was  normal with antithrombin III of 104, hypotensive function at 95%, and  protein S function of 83%.  His lupus anticoagulant was negative.  Beta-  2 glycoprotein was less than 4 and homocysteine was 8.1 and factor V  Leiden mutation was negative.  He received oral furosemide 40 mg daily  with potassium 10 mEq daily for leg swelling, but this has not subsided  with elevation as well.  His INR this morning is 1.7, and he received 2  mg of Coumadin last night.  He is hemodynamically stable for discharge  home today and he will have an INR checked in my office on Monday at 9  a.m.   DISCHARGE DIAGNOSES:  1. Right axillary vein thrombosis.  2. Right lower lobe pulmonary embolism.  3. History of multiple sclerosis, currently stable.  He has a PICC      line on his left arm through which he receives treatment under Dr.      Sandria Manly and he is  scheduled to have treatment on Tuesday, March 06, 2009, at [facility] and therefore, the PICC line will be      left in place due to poor peripheral venous access.  4. History of hypertension.  This is currently well controlled.   DISCHARGE MEDICATIONS:  1. Lasix 20 mg once a day p.r.n. for leg swelling.  2. Coumadin 5 mg p.o. daily.  3. Baclofen 20 mg t.i.d.  4. Plavix 75 mg daily.  5. Zoloft 20 mg daily.  6. Gabapentin 600 mg t.i.d.  7. Nortriptyline 25 mg nightly.  8. Omeprazole 40 mg daily.  9. Lisinopril/HCTZ 10/12.5 daily.  10.Skelaxin 800 mg t.i.d.  11.Percocet 5/325 one to two tablets q.4 h. p.r.n.   CONDITION ON DISCHARGE:  Stable.   DISPOSITION:  For home.   FOLLOWUP:  Follow up with me in the office on Monday, March 05, 2009, at  9 a.m.      Fleet Contras, M.D.  Electronically Signed     EA/MEDQ  D:  03/03/2009  T:  03/03/2009  Job:  [id number]

## 2011-03-25 NOTE — Discharge Summary (Signed)
[name], [name] NO.:  [id number]   MEDICAL RECORD NO.:  [id number]          PATIENT TYPE:  INP   LOCATION:  5149                         FACILITY:  [facility]   PHYSICIAN:  Genene Churn. Love, M.D.    DATE OF BIRTH:  08/26/1961   DATE OF ADMISSION:  06/01/2007  DATE OF DISCHARGE:                               DISCHARGE SUMMARY   ADDENDUM   LAB DATA:  Urinalysis showed negative evidence of urinary tract  infection, no nitrite, no leukocytes present.  An alcohol level was less  than 5.  Magnesium in the blood was 2.4.           ______________________________  Genene Churn. Sandria Manly, M.D.     JML/MEDQ  D:  06/04/2007  T:  06/04/2007  Job:  [id number]

## 2011-03-25 NOTE — Op Note (Signed)
NAMELATONYA, KNIGHT NO.:  [id number]   MEDICAL RECORD NO.:  [id number]          PATIENT TYPE:  INP   LOCATION:  5122                         FACILITY:  [facility]   PHYSICIAN:  Velora Heckler, MD      DATE OF BIRTH:  September 02, 1961   DATE OF PROCEDURE:  02/20/2009  DATE OF DISCHARGE:                               OPERATIVE REPORT   PREOPERATIVE DIAGNOSES:  Acute cholecystitis, cholelithiasis.   POSTOPERATIVE DIAGNOSES:  Acute cholecystitis, cholelithiasis.   PROCEDURE:  Laparoscopic cholecystectomy with intraoperative  cholangiography.   SURGEON:  Velora Heckler, MD, FACS   ANESTHESIA:  General.   ESTIMATED BLOOD LOSS:  Minimal.   PREPARATION:  ChloraPrep.   COMPLICATIONS:  None.   INDICATIONS:  The patient is a 50 year old black male who presents to  the emergency department with a 48-hour history of abdominal and right  upper quadrant pain.  There was slight elevation of his serum  transaminases.  He was admitted on the medical service.  He was seen in  consultation by Dr. Avel Peace from our practice.  The patient  underwent a HIDA scan which showed slow filling of the gallbladder.  He  now comes to surgery for cholecystectomy after discontinuation of Plavix  and subcu heparin.   BODY OF REPORT:  Procedure was done in OR #9 at [facility].  The patient was brought to the operating room and placed in  the supine position on the operating room table.  Following  administration of general anesthesia, the patient was positioned and  then prepped and draped in the usual strict aseptic fashion.  After  ascertaining that an adequate level of anesthesia had been achieved, an  infraumbilical incision was made with a #15 blade.  Dissection was  carried down through the subcutaneous tissues.  Fascia was incised in  the midline and the peritoneal cavity was entered cautiously.  A 0  Vicryl purse-string suture was placed in the fascia.  A  Hasson cannula  was introduced and secured with a purse-string suture.  Abdomen was  insufflated with carbon dioxide.  Laparoscope was introduced and the  abdomen was explored.  Operative ports were placed along the right  costal margin in the midline, midclavicular line, and anterior axillary  line.  Fundus of the gallbladder was grasped and retracted cephalad.  Peritoneum overlying the neck of the gallbladder was incised.  The neck  of the gallbladder was somewhat tortuous due to edema and the nature of  the overlying peritoneum.  Cystic artery was dissected out, doubly  clipped, and divided.  Cystic duct was dissected out the clip was placed  at the neck of the gallbladder.  Cystic duct was incised.  Cloudy yellow  bile emanated from the cystic duct.  A Cook cholangiography catheter was  introduced through a stab wound in the right upper quadrant.  It was  inserted into the cystic duct and secured with 2 Ligaclips.  Using C-arm  fluoroscopy, real time cholangiography was performed.  There was rapid  filling of a relatively normal caliber common bile duct.  There was  reflux of contrast into the right and left hepatic ductal systems.  Distal common bile duct did not show any obvious stones on initial runs.  However, there was no flow of contrast into the duodenum.  Second run of  dye was also injected again without significant filling of the duodenum  suspicious for a distal common bile duct stone.  Representative  radiographs were made for the medical record and for review from  Radiology.   Clips were withdrawn and Cook catheter was removed from the peritoneal  cavity.  Cystic duct was triply clipped and divided.  Cystic artery was  dissected out, doubly clipped, and divided.  Gallbladder was excised  from the gallbladder bed using the hook electrocautery for hemostasis.  Gallbladder was completely excised and placed into an EndoCatch bag.  It  was withdrawn through the umbilical  port without difficulty.  It  contained palpable gallstones.  The purse-string suture was tied  securely.  Right upper quadrant was irrigated with warm saline which was  evacuated.  Good hemostasis was noted.  Pneumoperitoneum was released.  Ports were removed under direct vision and good hemostasis was noted at  all port sites.  Port sites were anesthetized with local anesthetic.  Skin incisions were closed with interrupted 4-0 Vicryl subcuticular  sutures.  Wounds were washed and dried, and benzoin and Steri-Strips  were applied.  Sterile dressings were applied.  The patient was awakened  from anesthesia and brought to the recovery room in stable condition.  The patient tolerated the procedure well.      Velora Heckler, MD  Electronically Signed     TMG/MEDQ  D:  02/20/2009  T:  02/21/2009  Job:  [id number]

## 2011-03-25 NOTE — H&P (Signed)
[name], [name]             ACCOUNT NO.:  [id number]   MEDICAL RECORD NO.:  [id number]          PATIENT TYPE:  INP   LOCATION:  2037                         FACILITY:  [facility]   PHYSICIAN:  Fleet Contras, M.D.    DATE OF BIRTH:  [birth year]   DATE OF ADMISSION:  02/26/2009  DATE OF DISCHARGE:                              HISTORY & PHYSICAL   PRESENTING COMPLAINTS:  Swelling of both legs and shortness of breath.   HISTORY OF PRESENTING ILLNESS:  [name] is a 50 year old African  American gentleman with multiple medical problems including multiple  sclerosis; degenerative joint disease of the hips, knees, and shoulders;  depression; systemic hypertension; chronic pain syndrome; pulmonary  embolism in 2005; and was recently discharged from the hospital after an  episode of cholelithiasis with acute cholecystitis resulting in  laparoscopic cholecystectomy and ERCP.  He returned to the emergency  room after a week at home with progressively swelling legs and feet of  about 2 days' duration.  This was sore, but not extremely painful.  There was no obvious redness.  He did not have any history of any injury  or trauma.  He stated he had some mild shortness of breath with  exertion, but he did not have any chest pain.  No cough, sputum  production, or hemoptysis.  In the emergency room, he was not in any  acute respiratory or painful distress.  He was well hydrated, awake,  alert, oriented.  His chest exam essentially showed decreased air entry  bilaterally, but no wheezes, rhonchi, or rales.  He had 1+ pitting edema  of both lower legs and feet bilaterally.  Due to the previous history of  pulmonary embolism, CT scan of the abdomen was performed to evaluate for  pulmonary embolism.  This did reveal a right lower lobe pulmonary  embolus.  He has had no obvious source of embolism in the abdomen or  pelvis.  We will therefore admit him to the hospital for close  monitoring and  further treatment.   PAST MEDICAL HISTORY:  1. Cholelithiasis with cholecystitis with recent laparoscopic      cholecystectomy and ERCP a week ago.  This was essentially      uneventful.  2. Multiple sclerosis, under the care of Dr. Sandria Manly.  3. Degenerative arthritis of multiple joints including hips, knees,      and shoulders.  4. Chronic pain syndrome.  He is on the pain management.  5. Depression.  6. Systemic hypertension.  7. History of pulmonary embolism in 2005.  He was treated with oral      Coumadin for 1 year at that time.   MEDICATION HISTORY:  He is on Plavix 75 mg daily, baclofen 20 mg q.i.d.,  Zoloft 20 mg daily, gabapentin 600 mg t.i.d., nortriptyline 25 mg  nightly, omeprazole 40 mg daily, lisinopril/hydrochlorothiazide 10/12.5  daily, Skelaxin 800 mg t.i.d. Percocet 5/325 one-two tablets q.4 h.  p.r.n.   ALLERGIES:  He is allergic to Zanaflex.   SURGICAL HISTORY:  Tonsillectomy and recent lap cholecystectomy and  ERCP.   FAMILY AND SOCIAL HISTORY:  He  lives with his mother and his brother.  He is disabled but of his medical conditions.  He smokes about 1 pack of  cigarettes per day, but states that he is trying to quit.  He has a  history of cocaine and marijuana use in the past, but no recently.  He  denies any use of alcohol.   REVIEW OF SYSTEMS:  Essentially as above.   PHYSICAL EXAMINATION:  GENERAL:  He is lying comfortably in the hospital  bed, not in acute respiratory or painful distress.  He is not pale.  He  is not icteric.  He is not cyanosed.  He is well hydrated.  INITIAL VITAL SIGNS:  Blood pressure 129/90, respiratory rate of 20,  temperature 97.6, heart rate of 89 and regular, O2 sats on 2 L 95%.  HEENT:  His oral mucosa is moist.  NECK:  Supple with no elevated JVD.  No cervical lymphadenopathy and no  carotid bruit.  CHEST:  Reduced air entry at the bases, but no adventitious sounds.  No  rhonchi or wheezes.  Heart sounds 1 and 2 are heard  with no murmurs.  No  S3, gallops.  ABDOMEN:  Obese, soft, nontender.  Laparoscopic wounds are healed.  There is no obvious erythema or drainage.  Bowel sounds are present.  EXTREMITIES:  Pitting edema 1+ of the lower legs and feet bilaterally  with no areas of erythema or tenderness.  There is no calf tenderness.  Peripheral pulses are present bilaterally.  CNS:  He is alert, oriented x3 with bilateral lower extremity weakness.   LABORATORY DATA:  His white count is 9.7, hemoglobin 13.9, hematocrit  41.1, platelet count of 352.  INR is 1.0, PTT is 24.  Sodium is 136,  potassium 4.0, chloride 102, bicarbonate of 25, BUN is 2, creatinine  0.99, glucose is 83.  Liver function test shows AST of 33, ALT of 58,  alkaline phosphatase 114, total bilirubin 0.5, and albumin of 3.3.   CT scan of the chest showing right lower lobe pulmonary embolus.   ASSESSMENT:  [name] is a 50 year old African American gentleman  with multiple medical problems elucidated above presenting with  recurrent pulmonary embolism in the right lower lobe.  He was been  admitted to the hospital for intravenous heparin as well as oral  Coumadin therapy.   ADMISSION DIAGNOSIS:  Recurrent right lower lobe pulmonary embolism.   He will require long-term anticoagulation due to recurrent event.   PLAN OF CARE:  1. We will have a venous Doppler of lower extremity to rule out deep      venous thrombosis.  2. Hypercoagulation panel.  Overlapped IV heparin with p.o. Coumadin.      A venous Doppler of the right upper extremity at the site of PICC      line will also be performed.  He will be kept in the hospital until      INR is therapeutic between 2-3.  This plan of care has been      discussed with him and his questions were answered.      Fleet Contras, M.D.  Electronically Signed     EA/MEDQ  D:  02/27/2009  T:  02/27/2009  Job:  [id number]

## 2011-03-25 NOTE — Discharge Summary (Signed)
NAMEBLAYDE, BACIGALUPI NO.:  [id number]   MEDICAL RECORD NO.:  [id number]          PATIENT TYPE:  INP   LOCATION:  5122                         FACILITY:  [facility]   PHYSICIAN:  Fleet Contras, M.D.    DATE OF BIRTH:  10-20-1961   DATE OF ADMISSION:  02/16/2009  DATE OF DISCHARGE:  02/23/2009                               DISCHARGE SUMMARY   HISTORY OF PRESENT ILLNESS:  [name] is a 50 year old African  American gentleman with past medical history significant for multiple  sclerosis, systemic hypertension, depression, pulmonary embolism and  chronic pain syndrome on chronic narcotic therapy.  He came to the  emergency room at [facility] with 2 days history of epigastric  and right upper quadrant abdominal pain associated with nausea and  diarrhea.  He also has some low-grade fevers and had not lost his  appetite.  In the emergency room, he was noted to be in moderate-to-  severe pain.  He was mildly febrile with a temperature of 100.8 and his  laboratory data showed elevated white count.  He had slight elevation of  AST and ALT.  Abdominal ultrasound revealed multiple gallstones with  thin-walled gallbladder but no pericholecystic fluid.  He was evaluated  by Surgery and noted to have exam finding suggestive of cholecystitis.  He underwent a HIDA scan which confirmed cholecystitis was therefore  admitted to the hospital for intravenous antibiotic therapy and  consideration for laparoscopic cholecystectomy.   HOSPITAL COURSE:  On admission, the patient's white count was 19,000 and  was therefore started on intravenous antibiotics with Unasyn,  intravenous fluids and his potassium of 3.4 was repleted intravenously.  This condition improved where he continued to have some pain.  His  Plavix was put on hold and on February 20, 2009 he proceeded to the OR for  laparoscopic cholecystectomy.  This was performed but there was a  finding of possible distal common  bile duct stone.  Gastroenterology  consult was requested and the patient was seen by Dr. Leone Payor who  proceeded to perform an EGD and ERCP on him.  Under conscious sedation  on February 21, 2009 the patient apparently desaturated so this procedure  was abandoned.  He therefore went back for ERCP on February 20, 2009 under  general anesthesia.  This he tolerated and showed normal exam with no  obvious stone visualized in the common bile duct.  It was suspected that  the abnormality seen on IOC was either a spasm  or he had passed the  stone.  This morning he is feeling much better.  He has not had any  further abdominal pain, no nausea, and no vomiting.  He is tolerating a  full diet.   PHYSICAL EXAMINATION:  VITAL SIGNS:  This morning showed a blood  pressure of 148/86, heart rate of 91, respiratory rate of 18,  temperature 98.4.  CHEST:  Clear to auscultation.  ABDOMEN:  Obese, soft, nontender, no masses.  Bowel sounds are present.  EXTREMITIES:  No edema, calf tenderness or swelling.  CNS:  He is alert and oriented x3.  LATEST LABORATORY DATA:  On February 19, 2009 shows a white count of 7.1,  hemoglobin 13.1, hematocrit 38.5, platelet count of 167.  Sodium is 139,  potassium 3.6, chloride 103, bicarbonate of 29, glucose 109, BUN 9,  creatinine 0.9, AST 26, ALT 49, total bilirubin 0.6, and alkaline  phosphatase 67.   He is now considered stable for discharge home today.   DISCHARGE DIAGNOSES:  1. Cholelithiasis with cholecystitis status post laparoscopic      cholecystectomy and endoscopic retrograde cholangiopancreatography.  2. Systemic hypertension.  3. Multiple sclerosis.  4. Possible sleep apnea syndrome.   PLAN OF CARE:  For him to be discharged home to follow up with me in the  office in about 2 weeks.  He will need an outpatient sleep study  performed to rule out sleep apnea syndrome and he will follow up with  [facility] as scheduled.  He has a PICC line in his  right  upper arm for his intravenous therapy per Dr. Sandria Manly, neurologist for his  multiple sclerosis and he will be going home with this.  This plan of  care has been discussed with him and his questions were answered.   DISCHARGE MEDICATIONS:  Same as his admission medications including:  1. Baclofen 20 mg four times a day.  2. Plavix 75 mg daily.  3. Zoloft 20 mg daily.  4. Gabapentin 600 mg t.i.d.  5. Nortriptyline 25 mg at bedtime.  6. Percocet 5/325 one to two q.4 h. p.r.n.  7. Omeprazole 40 mg daily.  8. Skelaxin 800 mg t.i.d.  9. Lisinopril/hydrochlorothiazide 10/12.5 once daily.      Fleet Contras, M.D.  Electronically Signed     EA/MEDQ  D:  02/23/2009  T:  02/23/2009  Job:  [id number]

## 2011-03-28 NOTE — Discharge Summary (Signed)
[name], PICHON NO.:  [id number]   MEDICAL RECORD NO.:  [id number]          PATIENT TYPE:  INP   LOCATION:  3039                         FACILITY:  [facility]   PHYSICIAN:  Deanna Artis. Hickling, M.D.DATE OF BIRTH:  1961/10/01   DATE OF ADMISSION:  07/27/2004  DATE OF DISCHARGE:  08/02/2004                                 DISCHARGE SUMMARY   FINAL DIAGNOSES:  1.  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  2.  344.1.  3.  781.2.   PROCEDURES:  1.  MRI scan of the brain.  2.  MRI scan of the cervical cord both without and with contrast.  3.  Five day course of Solu-Medrol.   COMPLICATIONS:  None.   SUMMARY OF THE HOSPITALIZATION:  The patient is a 50 year old African-  American gentleman who has a known history of multiple sclerosis  relapsing/remitting type who fell three times on the day of his admission.  He was admitted to the hospital with worsening of his paraparesis and  appeared to be involving the left leg more so than the right.   He was admitted for treatment with IV Solu-Medrol and for further diagnostic  work-up.   Patient has had previous imaging studies that showed multiple lesions in the  subcortical white matter and in the periventricular region known as the  callosal-septal interface.  There are also ventral cord lesions at C6.  C6-7  in particular showed a 1 cm ventral lesion.  T1 1.5 cm ventral lesion and  T12 3 x 5 mm lesion.  There are also lesser lesions at C2-3 on the left and  small lesions at C3-4, 4-5, and C6.   As a result of this plans were made to reassess the patient to determine  whether or not he should be changed from Avonex to Betaseron or __________  to take advantage of higher dose interferon therapy.   Patient's imaging studies were as follows.  MRI of the brain revealed a few  scattered white matter lesions that were best seen on flair.  They were  present in  the frontal white matter bilaterally and the right  periventricular white matter.  There was also hyperintensity of the callosal-  septal interface but no new lesions were identified.  No enhancing lesions  were seen.   Cervical spine cord showed multiple cord lesions which are hyperintense on  the T2 signals, a large central lesion at C2-3, lesions to the left at C3  and C4, lesion to the left at C5, and a right-sided lesion at C6, right-  sided lesion at T1-2.  None of the lesions enhance.  There was also cervical  disk disease and bulges present at C3-4, C4-5, C5-6 without spinal stenosis.  Conclusions were that the lesions in the brain and spinal cord were  consistent with multiple sclerosis.  There was also some spondylosis that  was noted, but none of which was significant in terms of surgical lesion.  No laboratory studies were done.  The patient was started on Solu-Medrol at  a dose of 250 mg q.6h. or 500 mg q.12h. depending on the vials that were  present.  He was covered for gastric insult with Pepcid 20 mg at bedtime.  His other usual medications, gabapentin 300 mg t.i.d., Baclofen 20 mg two in  the morning, one at midday, two at nighttime, and Plavix 75 mg daily.  PT  was consulted for gait training.  Patient was at great risk for falling and  was monitored very carefully when he got up.   Dr. Orlin Hilding took over his care and switched him to Betaseron 0.125 mg  subcutaneous every other day x1 week and then 0.25 mg subcutaneous every  other day.  Neurontin was increased to 600 mg t.i.d. and Solu-Medrol dose  was set at 250 mg IV q.6h. for five days.  Baclofen was increased to 20 mg  five times per day rather than doubling up and the patient had a PICC line  placed because of IV access.   Patient was taken off of Pepcid and placed on Protonix and was given  Decadron 10 mg p.o. q.6h. until a PICC line could be placed.  Plavix was  discontinued.  Patient had some pain in his hips  and patient had imaging of  his hips which were normal.  Mild degenerative changes were noted in his  sacroiliac joints.   Patient had a rehabilitation consult with Dr. Lamar Benes.  It was Dr.  Thomasena Edis' opinion that the patient was most appropriate for subacute care  unit; however, it is my impression that the patient was actually sent to the  rehabilitation unit rather than S[facility]U upon discharge.   The patient was placed on a tapering dose of prednisone 40 mg daily for four  days, 30 mg daily for four days, 20 mg for four days, 10 mg for a week, and  then discontinue.  His gait improved to a slight degree.  He still intended  to skate when he walks and was unable to really pick his feet up.  He  continued to have asymmetric weakness greater on the left side than the  right.  There had been no other complications with treatment and his vital  signs were stable.  No deterioration in other areas of his neuraxis.       [facility]/MEDQ  D:  08/30/2004  T:  08/30/2004  Job:  [id number]   cc:   Santina Evans A. Orlin Hilding, M.D.  [address]  [address]  [place]  [place] [address]  Fax: [phone]   Ellwood Dense, M.D.  [address] [place]  [place] [address]  Fax: [phone]

## 2011-03-28 NOTE — Discharge Summary (Signed)
NAMEHARVEER, [name] NO.:  [id number]   MEDICAL RECORD NO.:  [id number]          PATIENT TYPE:  IPS   LOCATION:  4034                         FACILITY:  [facility]   PHYSICIAN:  Ellwood Dense, M.D.   DATE OF BIRTH:  20-Aug-1961   DATE OF ADMISSION:  12/17/2005  DATE OF DISCHARGE:  12/23/2005                                 DISCHARGE SUMMARY   DISCHARGE DIAGNOSES:  1.  Fall with L1 compression fracture and back sprain.  2.  Multiple sclerosis.  3.  Neuropathy.  4.  Low testosterone level.   HISTORY OF PRESENT ILLNESS:  Ms. [name] is a 50 year old male with  history of MS with spastic paraparesis, admitted to three past falls with  back pain and weakness.  X-rays done showed equivocal thoracic cord lesion,  contusion T11 spinous process, ligamentous sprain T11-T12 and acute  compression fracture L1 end plate.  Patient treated with morphine PCA for  pain control and followed by __________.  He also started on MS Contin as  continues with poor pain control and complains of insomnia.  Therapies were  initiated and patient was noted to have decrease in balance with tendency to  drag left lower extremity.  Also noted to have decrease in safety awareness.  Rehab was consulted for further therapies.   PAST MEDICAL HISTORY:  1.  MS with neuropathy and spastic paraparesis.  2.  Tonsillectomy.  3.  History of PE in October 2005.   ALLERGIES:  NO KNOWN DRUG ALLERGIES.   FAMILY HISTORY:  Positive for diabetes and hypertension.   SOCIAL HISTORY:  Patient is disabled, lives alone in an apartment with six  steps at entry.  Was able to ambulate short distances with use of cane and  AFO on left lower extremity.  Has an aide two hours five times a week.  Uses  alcohol occasionally.  Does not use any tobacco.   HOSPITAL COURSE:  Mr. [name] was admitted to rehab on December 17, 2005, for inpatient therapies to consist of PT and OT daily.  Past  admission, his subcu  Lovenox was continued for DVT prophylaxis.  Patient  with complaints of poor pain control.  He was maintained on MS Contin with  MSIR p.r.n. initially.  Neurontin was increased to 400 mg p.o. t.i.d. and  his Zanaflex was resumed at 2 mg t.i.d. initially.  On a.m. of December 18, 2005, patient with complaints of poor pain relief with MS Contin and MSIR.  He wanted to try Vicodin instead of oxycodone or MS Contin as he felt that  this could provide him with better relief.  Vicodin 7.5 mg has been used q.4-  6h. p.r.n. during his stay.  His Neurontin has slowly been increased to 600  mg p.o. t.i.d. and Zanaflex is slowing being titrated to 8 mg p.o. t.i.d. to  help with spasticity and neuropathy.  Other labs and tests during this  admission, include follow-up CBC revealing hemoglobin 12.1, hematocrit 35.9,  white count 4.9, platelets 192.  Check of lytes revealed sodium 137,  potassium 3.8, chloride 103, CO2 28, BUN  4, creatinine 1, glucose 100.  LFTs  revealed SGOT 35, SGPT 58, alkaline phosphatase 57, total bilirubin 0.4.  Hemoglobin A1c normal at 5.9.  Testosterone level check is noted to be low  at 218.39. Patient has been instructed to follow up with LMD for further  testing and treatment for low testosterone levels past discharge.   As patient's pain control has improved, his mobility has progressed along  also.  By the time of discharge, patient has progressed to being at modified  independent level for ADLs and toileting, modified independent for  transfers, modified independent for ambulating 30 feet x3 with a rolling  walker and left AFO, he is at modified independent for navigating few  stairs.  Speech therapy was also ordered secondary to patient complaining of  some stuttering and slurring.  He has been maintained on regular diet.  Speech pathology has been instructing patient on dysfluent speech. They felt  the patient was able to demonstrate 100% fluency with minimal to no  cueing  at structured conversation level.  Dysfluency did not appear to be speech  based or dysarthria deficit.  Patient has been instructed regarding watching  speed and slowing down to help assist with speech difficulties.  Dr. Leonides Cave,  neuropsychiatric was consulted for follow along regarding the situational  depression and anxiety.  He felt patient with mood disorder associated with  MS labile type, patient without reports or appearance of distress and no  further psychological services recommended.  Patient reported good support  services.  Patient will continue further follow-up outpatient physical  therapy at [facility] beginning on December 25, 2005.   On December 23, 2005, patient is discharged to home.   DISCHARGE MEDICATIONS:  1.  Neurontin 600 mg q.8h.  2.  Zanaflex 6 mg p.o. t.i.d. x2 days and increase to 8 mg t.i.d.  3.  Trazodone 50 mg p.o. nightly.  4.  Vicodin 5-500 one and a half p.o. q.4-6h. p.r.n.  5.  Ultram 50 mg p.o. q.i.d. p.r.n.   ACTIVITY:  As tolerated with use of a cane.   SPECIAL INSTRUCTIONS:  No alcohol, no smoking, no driving.  Outpatient rehab  at [facility] beginning December 25, 2005, at 11 a.m.   FOLLOW UP:  Patient to follow up with Dr. __________ for further work-up on  neurotestosterone level.  Follow-up with Dr. Tinnie Gens regarding further  adjustment of medications for neuropathy and spasticity.  Follow-up with Dr.  Lamar Benes as needed.      Greg Cutter, P.A.    ______________________________  Ellwood Dense, M.D.    PP/MEDQ  D:  12/23/2005  T:  12/24/2005  Job:  [id number]   cc:   Dr. Dia Crawford   Dr. Laurice Record, M.D.  Fax: [phone]

## 2011-03-28 NOTE — H&P (Signed)
NAME:  [name], [name] NO.:  [id number]   MEDICAL RECORD NO.:  [id number]          PATIENT TYPE:  EMS   LOCATION:  MAJO                         FACILITY:  [facility]   PHYSICIAN:  Sherin Quarry, MD      DATE OF BIRTH:  [birth year]   DATE OF ADMISSION:  08/11/2004  DATE OF DISCHARGE:                                HISTORY & PHYSICAL   [name] is a 50 year old man with a past history of multipole  sclerosis.  As a result of this problem, he has lower extremity weakness and  requires a walker for ambulation.  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 The  patient states on Saturday he had a persistent cough.  On Sunday morning, he  began to experience intermittent severe pleuritic chest and shoulder pain  which seemed to be on the right side of his chest.  On Sunday and again  today, he had an episode of cough productive of bloody mucus.  He has been  mildly to moderately dyspneic with exertion.  He feels that he may be  experiencing a low-grade fever.  He presented to [facility]  with these complaints and underwent a chest x-ray which showed evidence of  bibasilar atelectasis.  A CT scan of the chest was performed which shows  acute emboli involving the subsegmental branches of the right lower lobe  pulmonary artery as well as evidence of right lower lobe atelectasis with  patchy air space disease in the right upper lobe and left lower lobe.  Borderline cardiomegaly was also identified.  In light of this finding of  pulmonary embolus, the patient is admitted for further evaluation.   PAST MEDICAL HISTORY:   ALLERGIES:  The patient's records indicate that he has no known drug  allergies.  He is apparently intolerant of TIZANIDINE.   CURRENT MEDICATIONS:  Apparently consist of:  1.  Protonix 40 mg daily.  2.  Betaseron 0.25 mg every other day.  3.  Prednisone, the dose of which I believe he has reduced to 10 mg daily.  4.  Baclofen 10 mg 5 times  daily.  5.  Neurontin 300 mg 3 times daily.   The patient is somewhat vague in terms of his compliance with these  medications.   OPERATIONS:  He has had tonsillectomy.   MEDICAL ILLNESSES:  Multiple sclerosis. The patient has a past history of  multiple sclerosis.  He was recently admitted to [facility] on  September 17 with a one-week history of progressive leg weakness and  spasticity associated with falling. During the course of that  hospitalization, an MRI scan of the brain was obtained which showed  scattered white matter lesions consistent with the diagnosis of multiple  sclerosis.  A cervical MRI scan showed there to be degenerative disk  disease.  The patient was placed on intravenously Solu-Medrol, and then  predinsone was gradually tapered.  He was ultimately discharged on the above-  mentioned medications.  He indicates that at present he is able to ambulate  with the use of a walker, although he obviously has  significant residual leg  weakness.  It is mentioned in the Discharge Summary that he was advised to  use thigh-high TED stockings for deep vein thrombosis prophylaxis, but he  has not been doing this.  Apparently there was a plan to initiate outpatient  rehabilitation services, but I gather from taking with the patient that I do  not think this has begun.   FAMILY HISTORY:  He states he currently lives with his mother and brother.  There is no significant family history of any neurologic problems.   SOCIAL HISTORY:  He is a former [employer] architect, now disabled.  He smoked one  pack of cigarettes per day until three months ago.  He occasionally drinks  alcohol.  He has a past history of heroin abuse but not for the last 12  years.   REVIEW OF SYSTEMS:  HEAD:  He denies headache or dizziness.  EYES:  He  denies visual blurring or diplopia.  EAR, NOSE, THROAT:  He denies earaches,  sinus pain, or sore throat.  CHEST:  See above.  CARDIOVASCULAR:  There is   no recent history of exertional chest pain, orthopnea, or PND.  GI:  There  is no history of nausea, vomiting, abdominal pain, change in bowel habits,  melena, hematochezia.  GU:  He denies dysuria or urinary frequency.  NEUROLOGIC:  See above.  ENDOCRINE:  There is no history of excessive  thirst, urinary frequency, or nocturia.   PHYSICAL EXAMINATION:  VITAL SIGNS:  Temperature 100.5, blood pressure  115/78, pulse 100 to 115, respirations 24.  HEENT:  Within normal limits.  CHEST:  Remarkable for diminished breath sounds at the bases.  CARDIOVASCULAR:  Increased heart rate.  The heart rhythm is regular.  There  are no rubs or gallops.  ABDOMEN:  Within normal limits.  NEUROLOGIC:  The main finding is lower extremity weakness which seems to be  symmetrical with associated atrophy of musculature.  EXTREMITIES:  Examination reveals no evidence of swelling or ecchymosis.   LABORATORY AND X-RAY DATA:  Laboratory studies obtained included sodium 136,  potassium 4.9, chloride 109, BUN 13, glucose 116.  Hemoglobin 16.  Creatinine was 1.4.  D-dimer was 1.5 which is increased.   IMPRESSION:  1.  Acute pulmonary embolus diagnosed by CT scan criteria, localized to      right lower lobe with associated atelectasis and hemoptysis.  2.  History of multiple sclerosis with lower extremity weakness.  This may      be the risk factor for #1.  3.  Gastroesophageal reflux.  4.  Possible bronchitis versus bronchial pneumonia on the basis of CT scan      appearance.   PLAN:  1.  The patient will be started on intravenous heparin protocol and      Coumadin.  2.  He will need arrangements for a primary care doctor to follow his      Coumadin status.  3.  It is anticipated that he may continue to have episodes of hemoptysis.  4.  His usual medications for multiple sclerosis will be continued.  5.  We will make sure that pain medication is ordered. 6.  Of course, will monitor his vital signs and  oxygenation.  7.  In light of the patchy infiltrates on his CT scan, empiric antibiotic      therapy will be started with Zithromax and Rocephin.       SY/MEDQ  D:  08/12/2004  T:  08/12/2004  Job:  [id number]   cc:   Gustavus Messing. Orlin Hilding, M.D.  [address]  [address]  [place]  [place] [address]  Fax: [phone]

## 2011-03-28 NOTE — Op Note (Signed)
. [facility]  Patient:    [name], [name]                      MRN: [id number] Proc. Date: 08/31/00 Adm. Date:  [id number] Disc. Date: [id number] Attending:  Shelba Flake                           Operative Report  PREOPERATIVE DIAGNOSIS: Left knee loose body.  POSTOPERATIVE DIAGNOSIS: Left knee loose body.  OPERATION/PROCEDURE: Left knee examined under anesthesia followed by arthroscopic excision of loose body.  SURGEON: Elana Alm. Thurston Hole, M.D.  ASSISTANT: Gifford Shave, P.A.  ANESTHESIA: General.  OPERATIVE TIME: Thirty minutes.  COMPLICATIONS: None.  INDICATIONS FOR PROCEDURE: [name] is a 50 year old gentleman who has had significant left knee pain off and on for the past year, with signs and symptoms, and x-ray documenting possible loose body in the knee, and is now to undergo arthroscopy.  DESCRIPTION OF PROCEDURE: [name] was brought to the operating room on August 31, 2000 and placed on the operating table in the supine position. After an adequate level of general anesthesia was obtained the left knee was examined under anesthesia.  Range of motion from 0-125 degrees was noted, with 1+ crepitation.  The knee was stable to ligamentous examination.  The knee was sterilely injected with Marcaine 0.25% with epinephrine.  The left leg was prepped using sterile Betadine and draped using sterile technique.  Originally through an inferolateral portal the arthroscope with a pump attached was placed and through an inferomedial portal and arthroscopic probe was placed. On initial inspection of the medial compartment the articular cartilage in the medial femoral condyle and medial tibial plateau was found to be normal.  The medial meniscus was probed and this was found to be normal.  The intercondylar notch was inspected and the anterior and posterior cruciate ligaments were found to be normal.  There was a large calcific  partially loose body partially attached to the medial horn of the lateral meniscus, which measured approximately 1.5 x 1 cm, and was carefully dissected free from the lateral meniscus, although it was contiguous with a portion of the anterior median horn of the laterally meniscus which was resected with it.  After this was done no further impingement was noted, which this piece had significantly impinged into the anterior notch.  The lateral compartment was then inspected and the articular cartilage of the lateral femoral condyle and tibial plateau showed mild grade 1-2 changes.  The lateral meniscus was intact except for the anterior medial 25% which had been resected along with this calcific loose body piece.  The patellofemoral joint was inspected and the articular cartilage in this joint was normal.  The patella tracked normally.  Medial and lateral gutters were inspected and found to be free of pathology.  Moderate synovitis was debrided; otherwise, no further pathology was noted.  At this point it was felt that all pathology had been satisfactorily addressed.  The instruments were removed.  Portals were closed with 3-0 Nylon suture and injected with Marcaine 0.25% with epinephrine and morphine 5 mg.  A sterile dressing was applied and the patient awakened and taken to the recovery room in stable condition.  FOLLOW-UP CARE: [name] will be followed as an outpatient on Vicodin and Naprosyn.  He is to see me in the office in a week for suture removal and follow-up. DD:  08/31/00 TD:  08/31/00 Job: [id number]

## 2011-03-28 NOTE — Discharge Summary (Signed)
NAMEDABID, GODOWN NO.:  [id number]   MEDICAL RECORD NO.:  [id number]          PATIENT TYPE:  INP   LOCATION:  5010                         FACILITY:  [facility]   PHYSICIAN:  Alvester Morin, M.D.  DATE OF BIRTH:  1961-07-15   DATE OF ADMISSION:  12/13/2005  DATE OF DISCHARGE:  12/17/2005                                 DISCHARGE SUMMARY   DISCHARGE DIAGNOSIS:  1.  L1 compression fracture status post a fall.  2.  Multiple sclerosis.  3.  Question hypertestosteronism.  4.  History of pulmonary embolus in October 2005.   DISCHARGE MEDICATIONS:  1.  Os-Cal 500 mg p.o. b.i.d.  2.  Neurontin 300 mg t.i.d.  3.  Senokot 2 tabs p.o. daily.  4.  Zanaflex 4 mg p.o. q.8h.  5.  Nortriptyline 50 mg p.o. q.h.s.  6.  Phenergan 12.5 mg p.o. q.6h. p.r.n.  7.  MS Contin 15 mg q.12h. and MSIR p.r.n.  8.  Betaseron 0.3 mg every other day.   CONDITION ON DISCHARGE:  Improved pain control, discharged to inpatient  rehab.   PROCEDURES:  1.  December 13, 2005, MRI thoracic spine revealing equivocal diskographic      cord lesion seen only on sagittal images, possible MS of the cord,      slight contusion of the T1 spinous process and strain of the      interspinous ligament between T11 and T12.  2.  December 13, 2005, MRI of the lumbar spine revealing acute compression      fracture of L1 with mild wedging but no significant retropulsion of bone      or epidural hematoma, no disc protrusion, spinal stenosis, or abnormal      signal within the conus medullaria.  3.  Chest x-ray December 13, 2005, no active lung disease.   BRIEF HISTORY AND PHYSICAL:  50 year old [place] American male with multiple  sclerosis diagnosed in 2005 followed at [facility] by Dr. Leotis Shames,  neurologist, who presents with back  pain, weakness, and a fall the day  prior to admission.  The patient reports worsening of his multiple sclerosis  symptoms over the last 2-3 weeks with difficulty of ambulation,  increasing  weakness, and pain in the spinal cord.  He is requiring increased pain  medications.  He also reports increasing stuttering and speech difficulty.  He went to the grocery store the day prior to admission to purchase more  over the counter medications and fell in the parking lot due to weakness in  his legs.  He fell on his arm and side.  EMS brought him to the emergency  department where he was found to have a compression fracture of his L1  vertebra.  He also reports some subjective fevers, chills, night sweats.  He  did have a TB exposure 12 years ago but had a negative PPB following that.  He has been on steroids on several occasions in the past, the last course  being in November 2006.   On admission, temperature 98.2, blood pressure 137/91, pulse 74, respiratory  rate 16, oxygen saturation 98%  on room air.  In general, he is a healthy  appearing African American male who grimaces with movement and position  change.  Pin point pupils that are minimally responsive.  Extraocular  movements intact.  ENT:  Oropharynx clear.  Neck supple, no JVD, no  lymphadenopathy.  Lungs clear to auscultation bilaterally.  Heart:  Regular  rate and rhythm.  Abdomen soft, nontender, nondistended, positive bowel  sounds.  Back has point tenderness throughout the lower thoracic and upper  lumbar spine.  Extremities:  No edema.  Skin:  No rash or lesions.  Neurological:  Cranial nerves 2-12 intact.  Strength 2/5 left lower  extremity, 3/5 right lower extremity, and 4+/5 right and left upper  extremity, equivocal Babinski, possibly upgoing on the left.  Sensory  slightly decreased in the lower extremities bilaterally, did not access  gait, normal cerebellar function by finger-to-nose.  The patient is alert  and oriented x 4.   ADMISSION LABORATORY DATA:  White count 4.9, hemoglobin 13.7, platelets 192.  Sodium 140, potassium 3.9, chloride 109, bicarb 26, BUN 11, creatinine 0.9,  glucose 124,  ESR 8, alkaline phos 62, bilirubin 0.7, AST 46, ALT 98, protein  7, albumin 3.6, calcium 9.   HOSPITAL COURSE:  Problem 1:  L1 compression fracture.  The exact cause of the compression  fracture is unclear, it is thought due to osteopenia from steroid use in the  past.  No signs of infection including osteomyelitis seen on the MRI.  The  patient was afebrile with no white count.  A PPB was placed and was  negative.  ESR was normal.  Alkaline phos was negative.  Testosterone level  was checked which was low.  The patient was put on a regimen of long-acting  MS Contin with Percocet p.r.n. for break through pain use, given a back  brace, and at the time of transfer to rehab, the pain was relatively well  controlled.  He would benefit from a scan as an outpatient.   Problem 2:  Multiple sclerosis.  The patient is followed by Dr. Leotis Shames at  [facility].  We obtained records from them, the last visit was in January  2007 and no changes were made in his medications.  It does look like he was  on Interferon alpha-1 A but patient reports taking Betaseron alpha-1 B, it  is unclear why the medication was changed.  Questionable  new T12 cord  lesion was seen on MRI.  This will need evaluation as an outpatient.  The  patient's symptoms 2-3 weeks prior to his presentation are consistent with  worsening or relapsing of his MS.  Of note, the patient had been evaluated  by Dr. Sharene Skeans with [facility] here in [place] in the  past and he does wish to resume care here in [place] with Dr. Sharene Skeans.  An appointment can be set up either during his inpatient rehab stay or  afterwards as an outpatient.   Problem 3:  Hypertestosteronism.  Testosterone level was checked to assess  for causes of osteopenia, this level was low, although it was drawn in the  afternoon, early morning testosterone was obtained and this was still low, but on the lower end of normal.  He may need testosterone  supplementation at  discharge.   DISCHARGE LABS AND VITAL SIGNS:  CBC with white count 4.9, hemoglobin 12.1,  platelets 192.  Sodium 137, potassium 3.8, chloride 103, bicarb 28, BUN 4,  creatinine 1, glucose 100.  TSH within normal limits.  Parathyroid hormone  within normal limits.  HIV antibody nonreactive.  On discharge, temperature  97.4, pulse 63, respirations 18, blood pressure 146/95, oxygen saturation  97% on room air.      Clent Demark, M.D.      Alvester Morin, M.D.  Electronically Signed    LG/MEDQ  D:  12/18/2005  T:  12/18/2005  Job:  [id number]   cc:   Deanna Artis. Sharene Skeans, M.D.  Fax: [phone]   Dr. Leotis Shames  [facility] Neurology

## 2011-03-28 NOTE — Discharge Summary (Signed)
NAMETHORNTON, DOHRMANN             ACCOUNT NO.:  [id number]   MEDICAL RECORD NO.:  [id number]          PATIENT TYPE:  INP   LOCATION:  3022                         FACILITY:  [facility]   PHYSICIAN:  Hollice Espy, M.D.DATE OF BIRTH:  05-04-1961   DATE OF ADMISSION:  08/11/2004  DATE OF DISCHARGE:  08/16/2004                                 DISCHARGE SUMMARY   CONSULTS:  Dr. Marcelino Freestone, neurology.   DISCHARGE DIAGNOSES:  1.  Pulmonary embolus.  2.  History of multiple sclerosis.  3.  Lower extremity weakness and minimal ambulation secondary to multiple      sclerosis and causing #1.  4.  Gastroesophageal reflux disease.   DISCHARGE MEDICATIONS:  1.  Coumadin 7.5 mg p.o. q.h.s.  2.  Protonix 40 daily.  3.  Prednisone 10 mg p.o. daily.  4.  Baclofen 10 mg p.o. five times a day.  5.  Neurontin 300 mg p.o. t.i.d.  6.  Toradol 10 mg p.o. q.6h. as needed for pain.  7.  Betaseron which will be set up by Dr. Orlin Hilding in her office for      secondary through assistance.   HISTORY OF PRESENT ILLNESS:  Patient is a 50 year old African-American male  with past medical history of multiple sclerosis and with lower extremity  weakness.  He had been at a rehabilitation center to improve his ambulation,  but had signed himself out AMA.  He has problems with cognitive dysfunction  secondary to his multiple sclerosis and often times is noncompliant with  treatment and medications because of this.  Patient began on August 10, 2004  to have a persistent cough followed by the second day having severe chest  and shoulder pain with hemoptysis.  He also began to feel mild to moderately  short of breath with exertion and was even experiencing a fever.  He came to  [facility] and chest x-ray showed evidence of bibasilar  atelectasis.  A CT scan was done which showed right lower lobe pulmonary  artery emboli in the subsegmental branches as well as atelectasis and patchy  airway  disease.  In light of these findings it was felt that patient had a  pulmonary embolus.  A Doppler scan of his lower extremities showed no  evidence of any DVT, however.  Patient was started on heparin plus Coumadin  as well as the rest of his medications.   HOSPITAL COURSE:  #1 - PULMONARY EMBOLUS:  He was started on heparin.  His  INR became therapeutic on August 15, 2004 at a level of 2.9.  He had  actually previously jumped from less than 2 up to 2.9 on 10 mg a day.  He  was watched and his level came down to 2.4 after receiving no Coumadin on  the night of October 6.  It was likely felt by pharmacy, who was helping  dose his Coumadin, that he would benefit on Coumadin 7.5 mg p.o. q.h.s.  Patient will receive one dose prior to being discharged and then he will be  started on Coumadin 7.5 mg p.o. q.h.s. starting on  October 8.  He will need  to stay on this medication for the next three to six months.  He will have  an INR check Monday with results being sent to me.  In the meantime he will  then establish the PCP who will then dictate his Coumadin regimen.   #2 - MULTIPLE SCLEROSIS:  Initially, patient had been on Betaseron at some  point.  He had been recently admitted to [facility] on September 17 with a  one week history of progressive leg weakness and spasticity.  At time MRI  showed signs consistent with MS.  Patient was started on IV Solu-Medrol  while he was here.  This was again, at that time, and gradually changed over  to prednisone.  Dr. Orlin Hilding from neurology did a courtesy consult with the  patient who explained the patient's cognitive dysfunction and sometimes  medication noncompliance.  She has convinced him once again to go back on  Betaseron and she has set up drug assistance program for this.  Patient will  follow up in her office in one week's time to continue on this as well as  being managed of his multiple sclerosis.  He is receiving medications for  his  prednisone, Baclofen, and Neurontin in the meantime for his spasticity.   #3 - GASTROESOPHAGEAL REFLUX DISEASE:  This was a stable issue.  He was  given a prescription for his Protonix.   #4 - ADDITIONAL SPASTICITY:  Also of note, patient was evaluated by PT/OT  who recommended that patient be fitted for lower extremity KAFOs.  Patient  met with a prosthesis while he was here who fitted him and hopefully this  will increase his ambulation.  It is recommended that patient have some  outpatient physical and occupational therapy after being evaluated and a  prescription has been written for this as well.  Patient is being discharged  to home.   DISPOSITION:  Improved.   ACTIVITY:  As per the PT/OT outpatient.   DIET:  Recommended to be a regular diet, although to be weary of medications  that may trigger his GERD.  He is to receive information on medications that  may interfere with his Coumadin.   FOLLOWUP:  Patient will follow up with Dr. Orlin Hilding next week in her office.  He has been given a 1-800 number to establish at a primary care physician  and he will have his INR checked with results to be faxed to myself, Dr.  Rito Ehrlich, for me to follow up until he has established a PCP.      Send   SKK/MEDQ  D:  08/16/2004  T:  08/16/2004  Job:  [id number]   cc:   Santina Evans A. Orlin Hilding, M.D.  [address]  [address]  [place]  [place] [address]  Fax: [phone]

## 2011-03-28 NOTE — H&P (Signed)
[name], [name] NO.:  [id number]   MEDICAL RECORD NO.:  [id number]          PATIENT TYPE:  IPS   LOCATION:  4034                         FACILITY:  [facility]   PHYSICIAN:  Ranelle Oyster, M.D.DATE OF BIRTH:  01/23/1961   DATE OF ADMISSION:  12/17/2005  DATE OF DISCHARGE:                                HISTORY & PHYSICAL   CHIEF COMPLAINT:  Left-sided weakness and back pain.   HISTORY OF PRESENT ILLNESS:  This is a 50 year old black male with history  of MS and spastic paraparesis who was admitted on February 3 after a fall  with acute pain in his back.  X-rays revealed a possible cord lesion, but  definitely a T11 spinous process contusion and ligamentous sprain at the T11-  T12 with acute compression fracture at L1.  Patient was placed on morphine  for pain control.  He is started on MS Contin today for baseline control.  Pamelor was also added for insomnia and neuropathic pain.  Patient continues  to have difficulties with movement and self care.  He was fairly active  prior to this admission.  He had just given up driving not too long ago due  to periodic spasticity.   REVIEW OF SYSTEMS:  Positive for low back pain, numbness in all four  extremities left more so than right, reflux, and decreased sleep.  Full  review is in the written H&P.   PAST MEDICAL HISTORY:  1.  Positive for pulmonary embolism in 2005.  2.  Tonsillectomy.  3.  MS.   FAMILY HISTORY:  Positive for diabetes and hypertension.   SOCIAL HISTORY:  Patient is disabled and lives alone in an apartment with  six steps to enter.  He is able to ambulate short distances at home.  He has  a cane, walker, and a powered wheelchair.  He is eligible for a CAPS worker  soon.  He does have a history of drinking.   MEDICATIONS PRIOR TO ARRIVAL:  1.  Betaseron 175 mg every other day.  2.  Zanaflex 4 mg t.i.d.  3.  Neurontin 600 mg t.i.d.  4.  B12 daily.  5.  Vicodin p.r.n.   ALLERGIES:  No  known drug allergies.   LABORATORIES:  Hemoglobin 13.7, white count 4.9, platelets 192,000.  Sodium  140, potassium 3.9, BUN and creatinine 11/0.9, sugar 124.   PHYSICAL EXAMINATION:  VITAL SIGNS:  Blood pressure 124/74, pulse 82,  respiratory rate 16.  Patient is afebrile.  GENERAL:  He is pleasant, in no acute distress.  Pupils are equal, round,  and reactive to light and accommodation.  Extraocular eye movements are  intact.  Ear, nose, and throat examination is unremarkable.  Oral mucosa is  pink and moist.  NECK:  Supple without JVD or lymphadenopathy.  CHEST:  Clear to auscultation bilaterally without wheezes, rales, or  rhonchi.  HEART:  Regular rate and rhythm without murmurs, rubs, or gallops.  EXTREMITIES:  No clubbing, cyanosis, edema.  Pulses are 2+.  SKIN:  Intact.  ABDOMEN:  Soft, nontender.  Bowel sounds positive.  NEUROLOGIC:  Patient has intact  cognition, orientation, memory, and mood.  Cranial nerve reveals mild left central 7 and decreased sensation periorally  on the left to a minimal extent.  Reflexes are hyperactive throughout.  Sensation is decreased in the periphery.  Patient has 1/4 tone left greater  than right.  Motor function is 4+ to 5/5 on the right, 4 to 4+/5 on the left  except ankle dorsiflexion and plantar flexion which is 1+ to 2/5.   ASSESSMENT/PLAN:  1.  Functional deficits secondary to multiple sclerosis and L1 compression      fracture and associated back pain and decreased mobility.  Begin      comprehensive inpatient rehabilitation with physical therapy for      mobility, lower extremity strength, and range of motion; occupational      therapy for upper extremity use and activities of daily living; speech      to assess dysarthria and follow up cognition; nurse to assess for bowel      and bladder, skin integrity.  Case manager and social worker will      address psychosocial needs.  Estimated length of stay is 10-14 days.      Prognosis  good.  Goals are modified independent at wheelchair/walker      level.  2.  Pain management with MS Contin 15 mg q.12h. and MS-IR p.r.n.  3.  Deep venous thrombosis prophylaxis.  Subcutaneous Lovenox.  4.  Multiple sclerosis.  Continue Betaseron every other day.  5.  Neuropathy.  Increase Neurontin to 400 mg t.i.d.  6.  Insomnia.  Begin trazodone for sleep.  7.  Questionable hypotestosterone state.  Check a testosterone level.  8.  L1 fracture.  Continue corset wear when out of bed.      Ranelle Oyster, M.D.  Electronically Signed     ZTS/MEDQ  D:  12/17/2005  T:  12/17/2005  Job:  [id number]

## 2011-03-28 NOTE — Discharge Summary (Signed)
[name], [name] NO.:  [id number]   MEDICAL RECORD NO.:  [id number]          PATIENT TYPE:  IPS   LOCATION:  4025                         FACILITY:  [facility]   PHYSICIAN:  Mariam Dollar, P.A.  DATE OF BIRTH:  22-May-1961   DATE OF ADMISSION:  08/02/2004  DATE OF DISCHARGE:                                 DISCHARGE SUMMARY   DISCHARGE DIAGNOSES:  1.  Exacerbation of multiple sclerosis.  2.  Pain management.  3.  Thigh high TED stockings for deep vein thrombosis prophylaxis.  4.  Gastroesophageal reflux disease.   HISTORY OF PRESENT ILLNESS:  This is a 50 year old, right handed, black male  with a history of multiple sclerosis diagnosed six months ago.  Admitted  September 17 with increased lower extremity weakness and spasticity with  falls.  MRI of the brain without change from prior MRI; scattered white  matter lesions.  Cervical spine MRI with degenerative disc disease; no  stenosis.  Neurology consult.  Placed on intravenous Solu-Medrol, changed to  by mouth with taper.  He had been on Avelox prior to admission.  This was  changed to Betaseron every other day.  He was admitted for comprehensive  rehabilitation program.   PAST MEDICAL HISTORY:  See discharge diagnoses.   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  Mother  and local family to assist as needed.  He used a cane prior to admission.  He is on disability.   ALLERGIES:  None.   He does smoke cigarettes, approximately one-half to one pack per day.  No  alcohol.   HOSPITAL COURSE:  Patient with progressive gains while on rehabilitation  services, needing ongoing encouragement to participate.  He was sometimes  refusing his therapy.  He had refused parts of his prednisone taper as he  had felt this had made him stiff.  It was discussed at length the need for  ongoing taper of this medication.  Nonetheless, he continued to progress  nicely.  He was now independent with  his straight cane, ambulating  throughout the rehabilitation unit after his safety had been addressed.  He  had no bowel or bladder disturbances.  He was refusing to stay any longer in  the rehabilitation unit and felt that he could be discharged to home with  outpatient therapies.  This was again discussed at length and he was  discharged to home.   ADMISSION LABORATORIES:  Sodium of 138, potassium 3.8, BUN 13, creatinine  0.9, hemoglobin 14, hematocrit 41.5.   DISCHARGE MEDICATIONS:  1.  Protonix 40 mg daily until prednisone taper completed.  2.  Betaseron 0.25 mg every other day.  3.  Prednisone taper as advised.  4.  Baclofen 10 mg five times daily.  5.  Neurontin 300 mg three times daily.   ACTIVITY:  As tolerated.   DIET:  Regular.   SPECIAL INSTRUCTIONS:  1.  No driving.  2.  Anticipate follow-up outpatient rehabilitation service therapies.  3.  He should follow-up with Dr. Orlin Hilding, neurology services.  Call for      appointment.  DA/MEDQ  D:  08/06/2004  T:  08/06/2004  Job:  [id number]   cc:   Erick Colace, M.D.  [address] [place]  [place] [address]  Fax: [phone]   Gustavus Messing. Orlin Hilding, M.D.  [address]  [address]  [place]  [place] [address]  Fax: [phone]
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
## 2011-06-19 ENCOUNTER — Emergency Department (HOSPITAL_COMMUNITY)
Admission: EM | Admit: 2011-06-19 | Discharge: 2011-06-19 | Disposition: A | Discharge status: Home / Self Care | Payer: Medicare Other | Attending: Emergency Medicine | Admitting: Emergency Medicine

## 2011-06-19 DIAGNOSIS — M7989 Other specified soft tissue disorders: Secondary | ICD-10-CM | POA: Insufficient documentation

## 2011-06-19 DIAGNOSIS — M79609 Pain in unspecified limb: Secondary | ICD-10-CM | POA: Insufficient documentation

## 2011-06-19 DIAGNOSIS — Z86718 Personal history of other venous thrombosis and embolism: Secondary | ICD-10-CM | POA: Insufficient documentation

## 2011-06-19 DIAGNOSIS — G35 Multiple sclerosis: Secondary | ICD-10-CM | POA: Insufficient documentation

## 2011-06-19 DIAGNOSIS — Z7901 Long term (current) use of anticoagulants: Secondary | ICD-10-CM | POA: Insufficient documentation

## 2011-06-19 DIAGNOSIS — L02419 Cutaneous abscess of limb, unspecified: Secondary | ICD-10-CM | POA: Insufficient documentation

## 2011-06-19 DIAGNOSIS — Z79899 Other long term (current) drug therapy: Secondary | ICD-10-CM | POA: Insufficient documentation

## 2011-06-19 LAB — CBC
HCT: 42.2 % (ref 39.0–52.0)
Hemoglobin: 14.7 g/dL (ref 13.0–17.0)
MCHC: 34.8 g/dL (ref 30.0–36.0)
WBC: 6 10*3/uL (ref 4.0–10.5)

## 2011-06-19 LAB — PROTIME-INR: INR: 1.97 — ABNORMAL HIGH (ref 0.00–1.49)

## 2011-06-19 LAB — POCT I-STAT, CHEM 8
Creatinine, Ser: 1 mg/dL (ref 0.50–1.35)
HCT: 47 % (ref 39.0–52.0)
Hemoglobin: 16 g/dL (ref 13.0–17.0)
Potassium: 3.4 mEq/L — ABNORMAL LOW (ref 3.5–5.1)
Sodium: 137 mEq/L (ref 135–145)
TCO2: 29 mmol/L (ref 0–100)

## 2011-06-19 LAB — DIFFERENTIAL
Basophils Absolute: 0 10*3/uL (ref 0.0–0.1)
Basophils Relative: 0 % (ref 0–1)
Lymphocytes Relative: 33 % (ref 12–46)
Monocytes Absolute: 0.9 10*3/uL (ref 0.1–1.0)
Neutro Abs: 2.9 10*3/uL (ref 1.7–7.7)
Neutrophils Relative %: 49 % (ref 43–77)

## 2011-07-07 ENCOUNTER — Other Ambulatory Visit: Payer: Self-pay | Admitting: Internal Medicine

## 2011-07-07 DIAGNOSIS — K859 Acute pancreatitis without necrosis or infection, unspecified: Secondary | ICD-10-CM

## 2011-07-08 ENCOUNTER — Ambulatory Visit
Admission: RE | Admit: 2011-07-08 | Discharge: 2011-07-08 | Disposition: A | Discharge status: Home / Self Care | Payer: Medicare Other | Source: Ambulatory Visit | Attending: Internal Medicine | Admitting: Internal Medicine

## 2011-07-08 DIAGNOSIS — K859 Acute pancreatitis without necrosis or infection, unspecified: Secondary | ICD-10-CM

## 2011-07-23 IMAGING — CR DG TIBIA/FIBULA 2V*L*
4 series · 4 of 4 positions shown · non-contrast
Comparison: None.

CLINICAL DATA: The patient fell on glass.  Laceration of the calf.

LEFT TIBIA AND FIBULA - 2 VIEW

[t tib/fib ap left (1 of 2)]
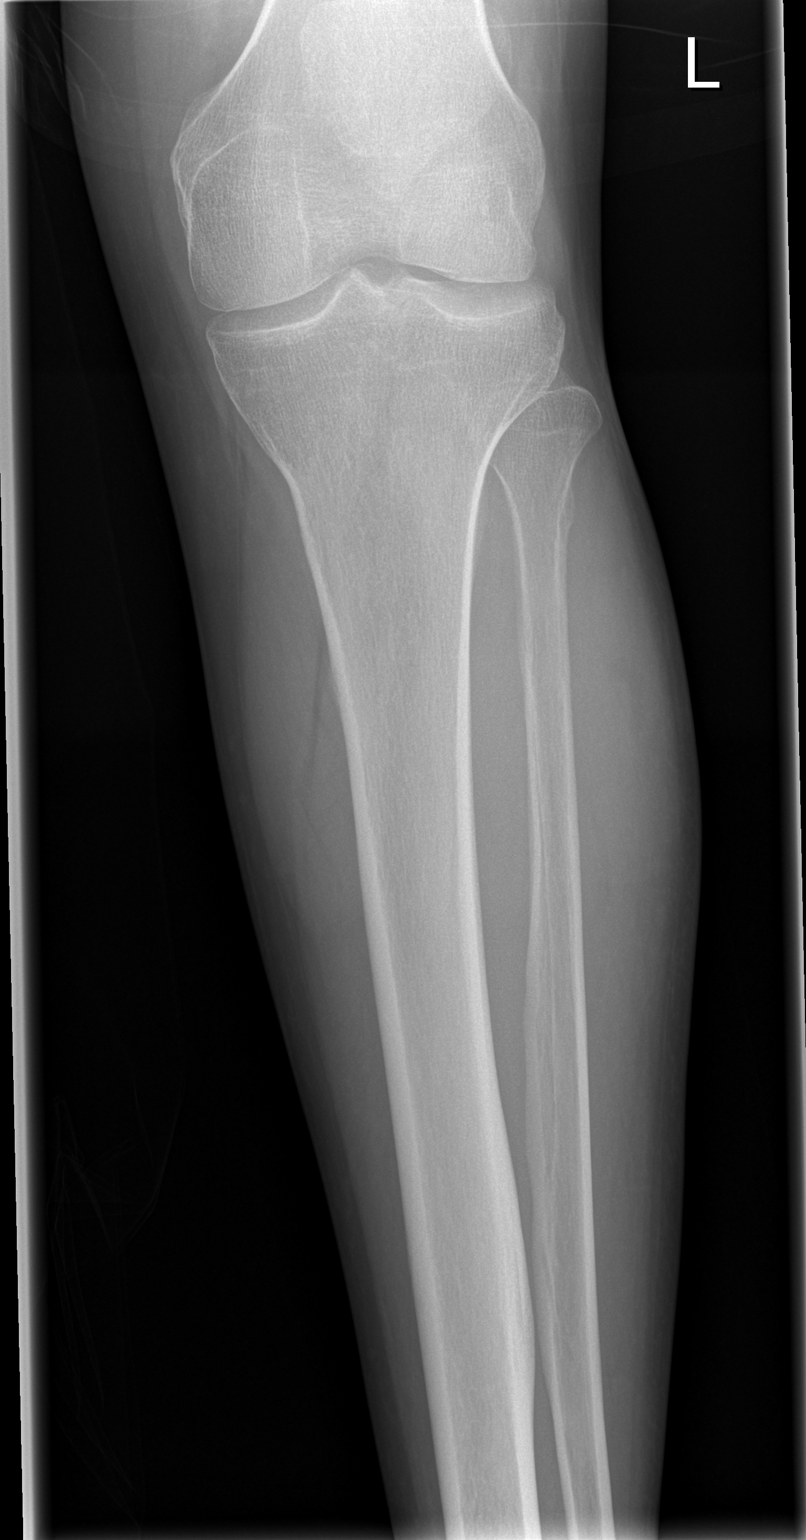

[t tib/fib ap left (2 of 2)]
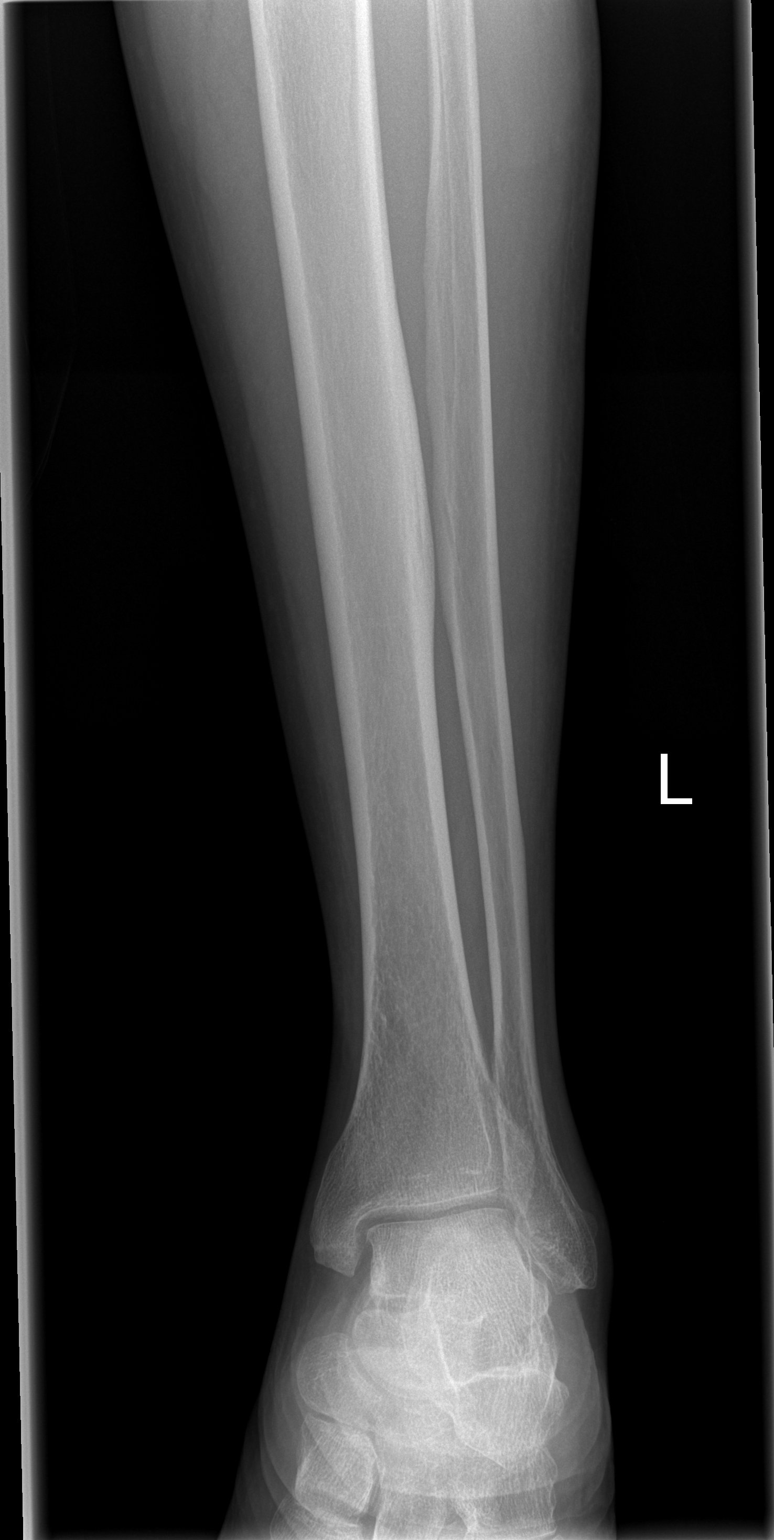

[t tib/fib lat left (1 of 2)]
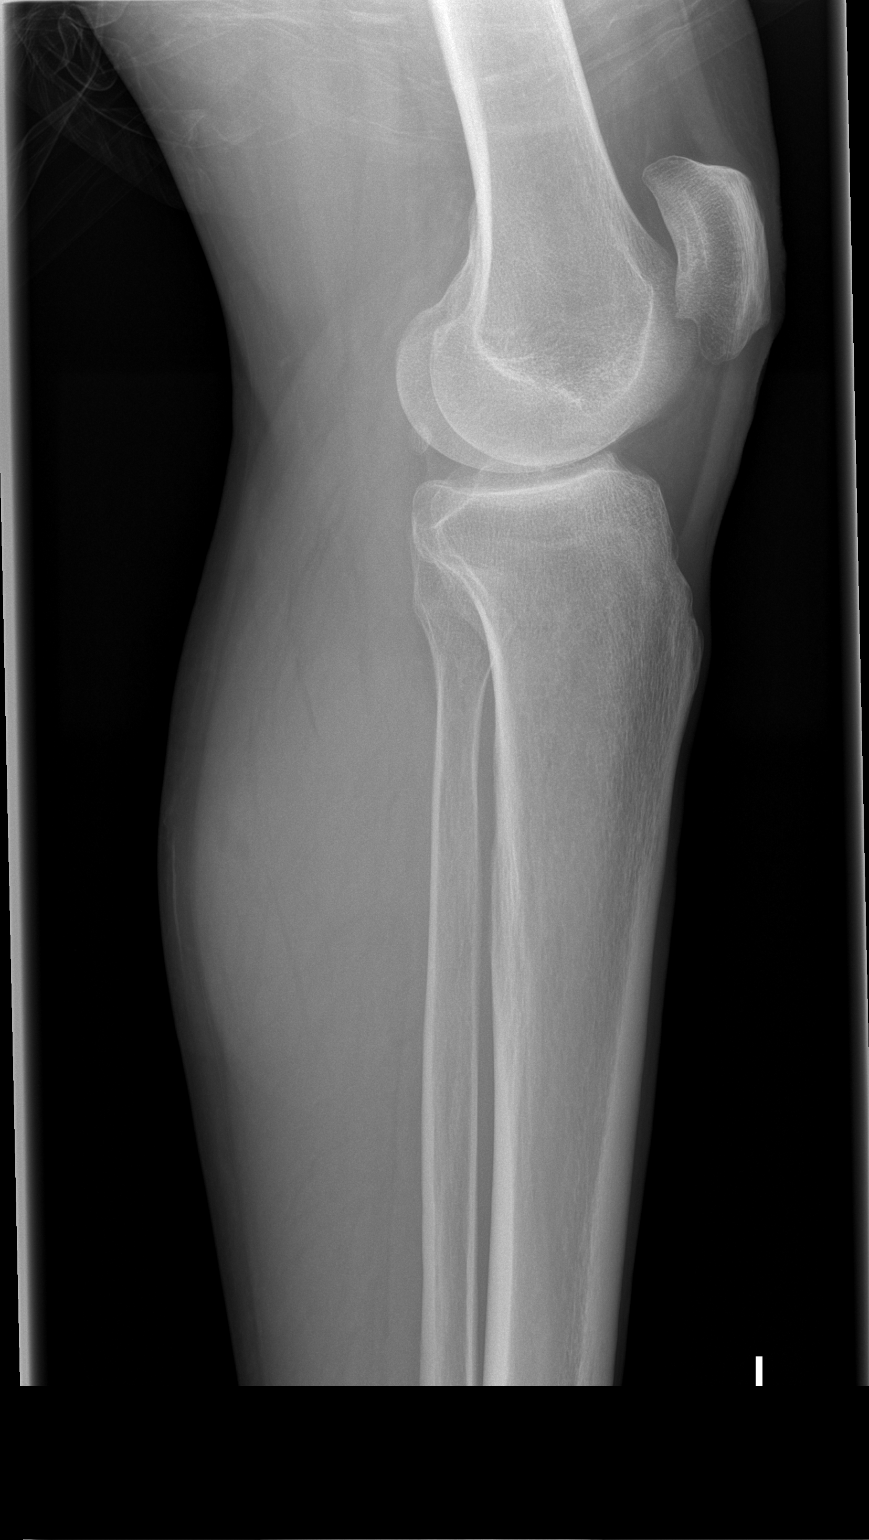

[t tib/fib lat left (2 of 2)]
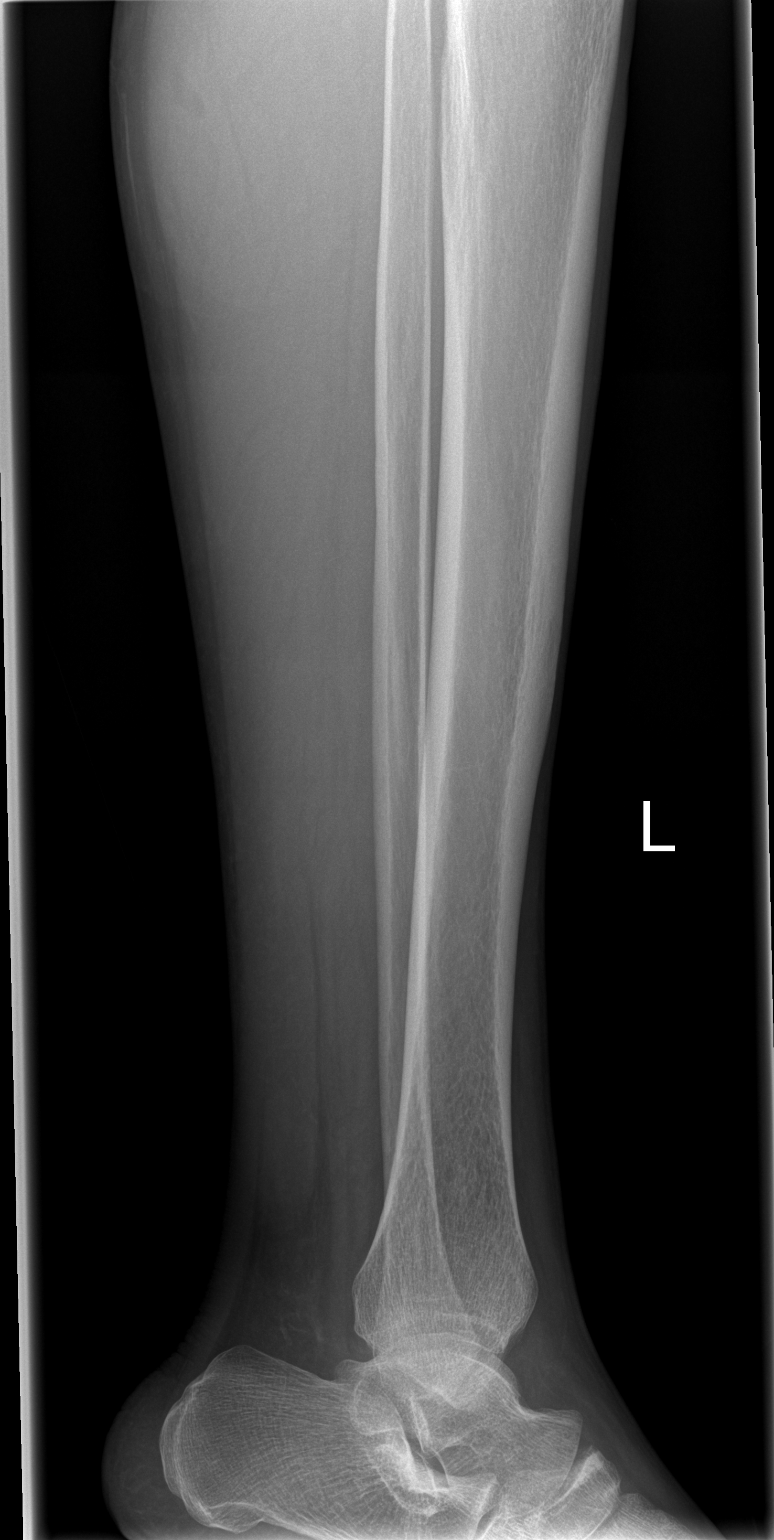

[4 of 4 positions shown; findings below may reference images not displayed]

FINDINGS: No fracture, foreign body, or acute bony findings are
identified.
IMPRESSION: No significant abnormality identified.

## 2011-07-23 IMAGING — CR DG PELVIS 1-2V
1 series · 1 of 1 positions shown · non-contrast
Comparison: 06/28/2009

CLINICAL DATA: Fall on glass.  The left posterior iliac laceration.

PELVIS - 1-2 VIEW

[t pelvis a.p.]
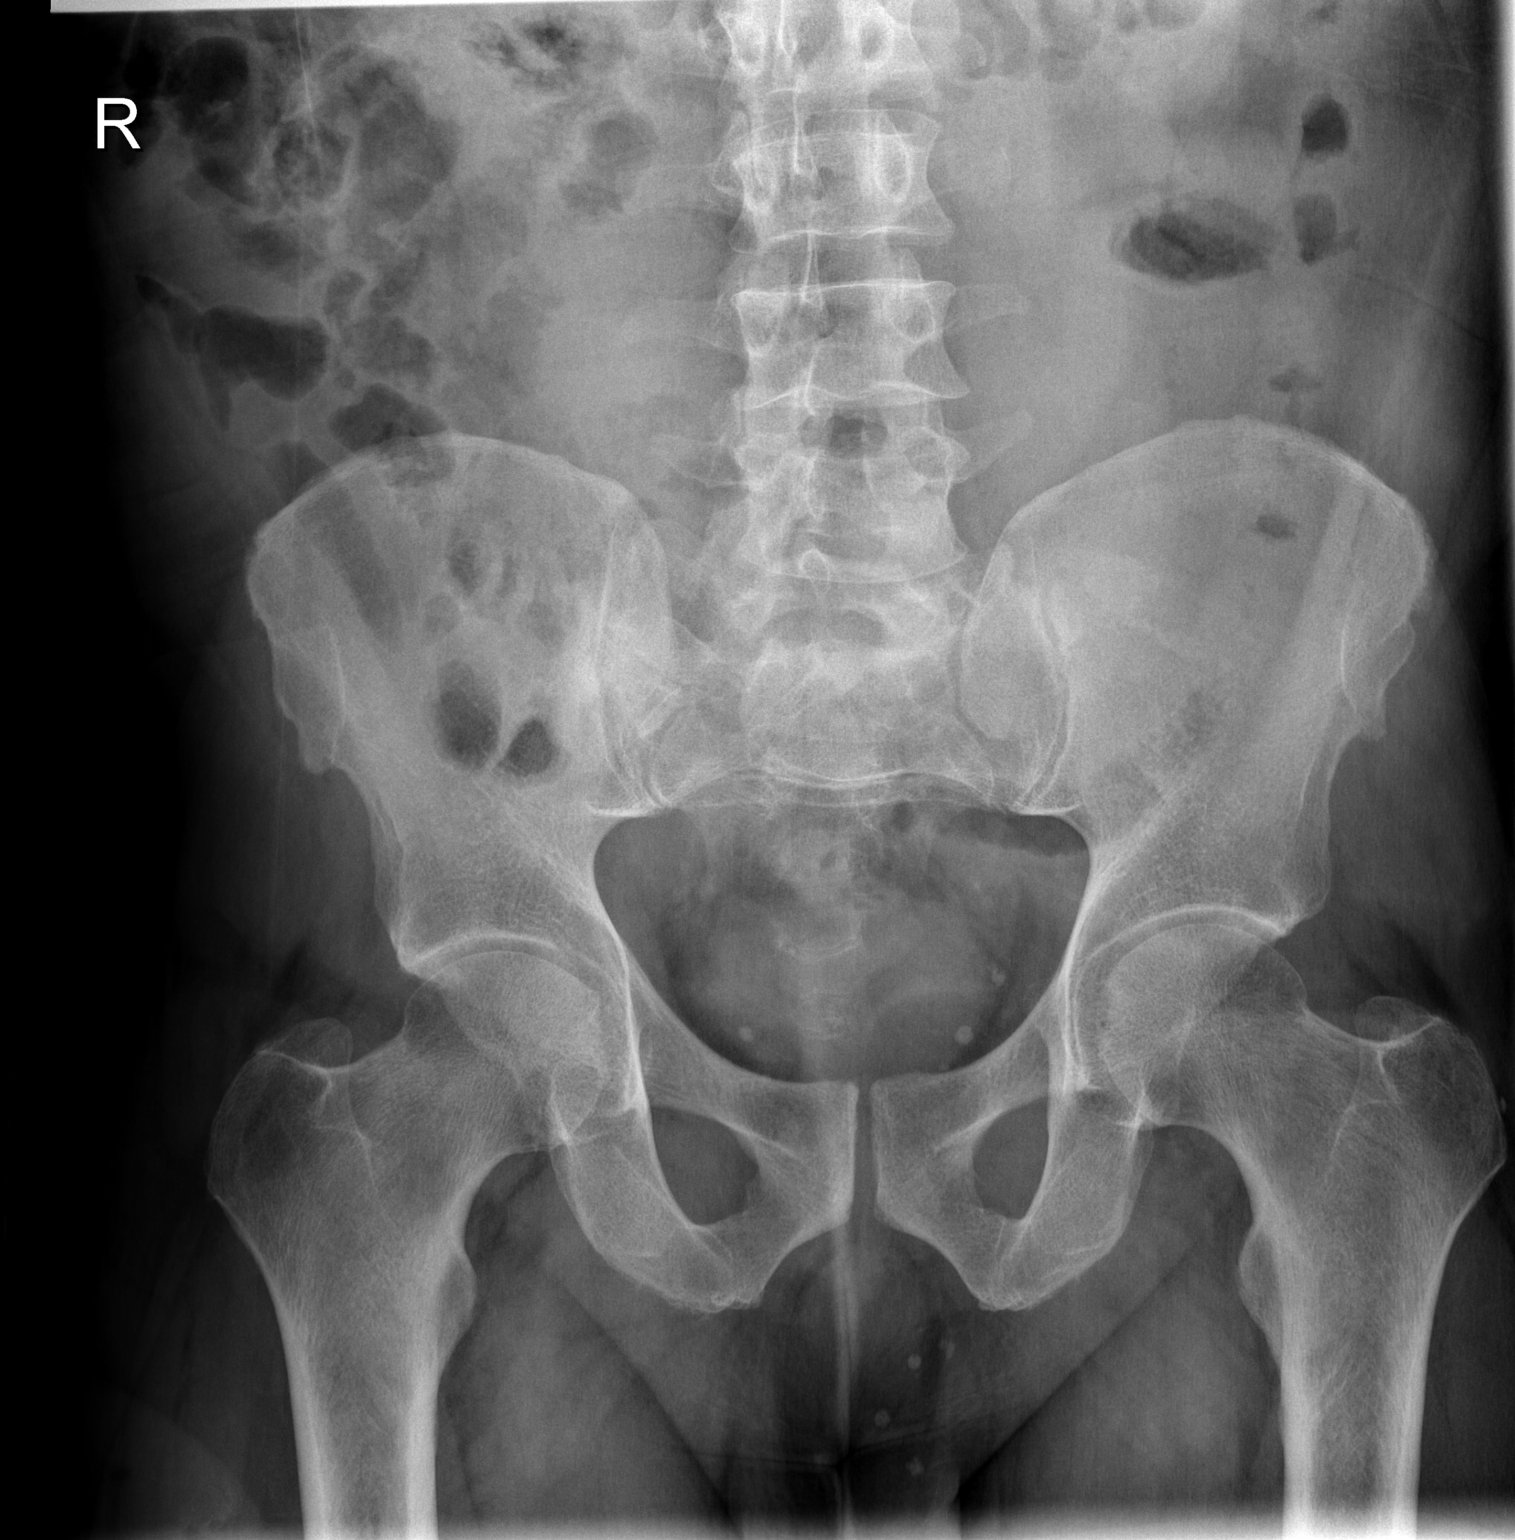

[1 of 1 positions shown; findings below may reference images not displayed]

FINDINGS: No foreign body characteristic of glass is identified
projecting over the left hip and iliac bone.  Please note that some
of the soft tissues lateral to the hip and pelvis on the left side
were excluded.

No fracture identified.
IMPRESSION: 1.  No foreign body identified.  Please note that some of the soft
tissues of the left flank and lateral to the left hip were excluded
on today's image.

## 2011-08-02 ENCOUNTER — Emergency Department (HOSPITAL_COMMUNITY)
Admission: EM | Admit: 2011-08-02 | Discharge: 2011-08-03 | Disposition: A | Discharge status: Home / Self Care | Payer: Medicare Other | Attending: Emergency Medicine | Admitting: Emergency Medicine

## 2011-08-02 ENCOUNTER — Emergency Department (HOSPITAL_COMMUNITY): Payer: Medicare Other

## 2011-08-02 DIAGNOSIS — G35 Multiple sclerosis: Secondary | ICD-10-CM | POA: Insufficient documentation

## 2011-08-02 DIAGNOSIS — R109 Unspecified abdominal pain: Secondary | ICD-10-CM | POA: Insufficient documentation

## 2011-08-02 DIAGNOSIS — K5730 Diverticulosis of large intestine without perforation or abscess without bleeding: Secondary | ICD-10-CM | POA: Insufficient documentation

## 2011-08-02 DIAGNOSIS — Q619 Cystic kidney disease, unspecified: Secondary | ICD-10-CM | POA: Insufficient documentation

## 2011-08-02 DIAGNOSIS — R634 Abnormal weight loss: Secondary | ICD-10-CM | POA: Insufficient documentation

## 2011-08-02 LAB — DIFFERENTIAL
Eosinophils Relative: 1 % (ref 0–5)
Lymphocytes Relative: 37 % (ref 12–46)
Lymphs Abs: 2.1 10*3/uL (ref 0.7–4.0)
Monocytes Absolute: 0.5 10*3/uL (ref 0.1–1.0)

## 2011-08-02 LAB — COMPREHENSIVE METABOLIC PANEL
Albumin: 3.9 g/dL (ref 3.5–5.2)
BUN: 7 mg/dL (ref 6–23)
Chloride: 101 mEq/L (ref 96–112)
Creatinine, Ser: 0.87 mg/dL (ref 0.50–1.35)
GFR calc Af Amer: >60 mL/min (ref >60)
GFR calc non Af Amer: >60 mL/min (ref >60)
Glucose, Bld: 97 mg/dL (ref 70–99)
Total Bilirubin: 0.3 mg/dL (ref 0.3–1.2)

## 2011-08-02 LAB — URINALYSIS, ROUTINE W REFLEX MICROSCOPIC
Glucose, UA: NEGATIVE mg/dL
Hgb urine dipstick: NEGATIVE
Ketones, ur: 15 mg/dL — AB

## 2011-08-02 LAB — CBC
HCT: 41.9 % (ref 39.0–52.0)
Hemoglobin: 13.9 g/dL (ref 13.0–17.0)
MCV: 79.8 fL (ref 78.0–100.0)
RDW: 14.5 % (ref 11.5–15.5)
WBC: 5.6 10*3/uL (ref 4.0–10.5)

## 2011-08-02 MED ORDER — IOHEXOL 300 MG/ML  SOLN
100.0000 mL | Freq: Once | INTRAMUSCULAR | Status: AC | PRN
  Administered 2011-08-02: 100 mL via INTRAVENOUS

## 2011-08-08 LAB — POCT I-STAT, CHEM 8
BUN: 16
Chloride: 111
Creatinine, Ser: 1.1
Glucose, Bld: 96
Potassium: 3.7

## 2011-08-12 LAB — BASIC METABOLIC PANEL
BUN: 11
BUN: 13
BUN: 14
BUN: 19
BUN: 19
BUN: 20
BUN: 9
CO2: 21
CO2: 23
CO2: 23
CO2: 25
Calcium: 8.1 — ABNORMAL LOW
Calcium: 8.7
Calcium: 9.3
Calcium: 9.5
Chloride: 104
Chloride: 105
Chloride: 107
Chloride: 107
Chloride: 108
Creatinine, Ser: 0.8
Creatinine, Ser: 0.9
GFR calc Af Amer: >60
GFR calc Af Amer: >60
GFR calc Af Amer: >60
GFR calc Af Amer: >60
GFR calc non Af Amer: >60
GFR calc non Af Amer: >60
GFR calc non Af Amer: >60
GFR calc non Af Amer: >60
GFR calc non Af Amer: >60
GFR calc non Af Amer: >60
Glucose, Bld: 110 — ABNORMAL HIGH
Glucose, Bld: 127 — ABNORMAL HIGH
Glucose, Bld: 136 — ABNORMAL HIGH
Potassium: 3.7
Potassium: 3.8
Potassium: 4
Potassium: 4
Potassium: 4.1
Potassium: 4.3
Sodium: 131 — ABNORMAL LOW
Sodium: 133 — ABNORMAL LOW
Sodium: 135
Sodium: 135
Sodium: 136
Sodium: 137
Sodium: 139

## 2011-08-12 LAB — D-DIMER, QUANTITATIVE: D-Dimer, Quant: 3 — ABNORMAL HIGH

## 2011-08-12 LAB — CBC
HCT: 29.1 — ABNORMAL LOW
HCT: 30.8 — ABNORMAL LOW
HCT: 32.6 — ABNORMAL LOW
HCT: 33.8 — ABNORMAL LOW
HCT: 42.7
HCT: 45.2
Hemoglobin: 11.2 — ABNORMAL LOW
Hemoglobin: 14.1
Hemoglobin: 14.6
Hemoglobin: 9.6 — ABNORMAL LOW
MCHC: 32.4
MCHC: 32.9
MCHC: 33.2
MCHC: 33.7
MCV: 82.9
MCV: 83.8
MCV: 83.9
MCV: 83.9
MCV: 84.1
Platelets: 243
Platelets: 276
Platelets: 472 — ABNORMAL HIGH
Platelets: 475 — ABNORMAL HIGH
Platelets: 530 — ABNORMAL HIGH
RBC: 3.5 — ABNORMAL LOW
RBC: 4.02 — ABNORMAL LOW
RBC: 4.31
RBC: 4.6
RBC: 5.15
RBC: 5.39
RDW: 15.7 — ABNORMAL HIGH
RDW: 15.9 — ABNORMAL HIGH
WBC: 11 — ABNORMAL HIGH
WBC: 12.3 — ABNORMAL HIGH
WBC: 12.9 — ABNORMAL HIGH
WBC: 16.8 — ABNORMAL HIGH
WBC: 23.6 — ABNORMAL HIGH
WBC: 30.9 — ABNORMAL HIGH

## 2011-08-12 LAB — GLUCOSE, CAPILLARY
Glucose-Capillary: 101 — ABNORMAL HIGH
Glucose-Capillary: 102 — ABNORMAL HIGH
Glucose-Capillary: 103 — ABNORMAL HIGH
Glucose-Capillary: 104 — ABNORMAL HIGH
Glucose-Capillary: 106 — ABNORMAL HIGH
Glucose-Capillary: 107 — ABNORMAL HIGH
Glucose-Capillary: 109 — ABNORMAL HIGH
Glucose-Capillary: 111 — ABNORMAL HIGH
Glucose-Capillary: 116 — ABNORMAL HIGH
Glucose-Capillary: 118 — ABNORMAL HIGH
Glucose-Capillary: 118 — ABNORMAL HIGH
Glucose-Capillary: 118 — ABNORMAL HIGH
Glucose-Capillary: 118 — ABNORMAL HIGH
Glucose-Capillary: 119 — ABNORMAL HIGH
Glucose-Capillary: 119 — ABNORMAL HIGH
Glucose-Capillary: 120 — ABNORMAL HIGH
Glucose-Capillary: 120 — ABNORMAL HIGH
Glucose-Capillary: 121 — ABNORMAL HIGH
Glucose-Capillary: 124 — ABNORMAL HIGH
Glucose-Capillary: 125 — ABNORMAL HIGH
Glucose-Capillary: 130 — ABNORMAL HIGH
Glucose-Capillary: 137 — ABNORMAL HIGH
Glucose-Capillary: 139 — ABNORMAL HIGH
Glucose-Capillary: 142 — ABNORMAL HIGH
Glucose-Capillary: 184 — ABNORMAL HIGH
Glucose-Capillary: 83
Glucose-Capillary: 92
Glucose-Capillary: 92
Glucose-Capillary: 98
Glucose-Capillary: 99

## 2011-08-12 LAB — TROPONIN I
Troponin I: 0.01
Troponin I: 0.01

## 2011-08-12 LAB — BLOOD GAS, ARTERIAL
Acid-base deficit: 2.6 — ABNORMAL HIGH
Bicarbonate: 21
Bicarbonate: 22.3
Bicarbonate: 22.6
Bicarbonate: 23.4
Bicarbonate: 24.4 — ABNORMAL HIGH
Bicarbonate: 27.1 — ABNORMAL HIGH
Drawn by: 232811
Drawn by: 235321
Drawn by: 235321
Drawn by: 295031
Drawn by: 295031
FIO2: 0.4
FIO2: 1
MECHVT: 0.41
MECHVT: 0.41
O2 Saturation: 90.6
O2 Saturation: 92.4
O2 Saturation: 93.2
O2 Saturation: 94.5
PEEP: 10
PEEP: 10
PEEP: 10
PEEP: 10
PEEP: 5
PEEP: 5
PEEP: 8
Patient temperature: 101
Patient temperature: 97
Patient temperature: 98.6
Patient temperature: 98.6
Patient temperature: 98.6
Patient temperature: 99
Patient temperature: 99.4
RATE: 27
RATE: 27
RATE: 27
RATE: 27
RATE: 27
TCO2: 18.5
TCO2: 20.7
TCO2: 21.1
pCO2 arterial: 41.7
pCO2 arterial: 44.5
pH, Arterial: 7.332 — ABNORMAL LOW
pH, Arterial: 7.356
pH, Arterial: 7.391
pH, Arterial: 7.396
pH, Arterial: 7.404
pH, Arterial: 7.407
pO2, Arterial: 55.2 — ABNORMAL LOW
pO2, Arterial: 60.6 — ABNORMAL LOW
pO2, Arterial: 69.9 — ABNORMAL LOW
pO2, Arterial: 73 — ABNORMAL LOW
pO2, Arterial: 83.9

## 2011-08-12 LAB — RAPID URINE DRUG SCREEN, HOSP PERFORMED
Amphetamines: NOT DETECTED
Benzodiazepines: POSITIVE — AB
Cocaine: POSITIVE — AB
Tetrahydrocannabinol: POSITIVE — AB

## 2011-08-12 LAB — DIFFERENTIAL
Basophils Absolute: 0
Basophils Relative: 0
Basophils Relative: 0
Eosinophils Absolute: 0
Eosinophils Absolute: 0.2
Eosinophils Absolute: 0.6
Eosinophils Relative: 8 — ABNORMAL HIGH
Lymphocytes Relative: 16
Lymphocytes Relative: 18
Lymphs Abs: 1.9
Lymphs Abs: 3.2
Monocytes Absolute: 1.1 — ABNORMAL HIGH
Monocytes Relative: 18 — ABNORMAL HIGH
Monocytes Relative: 7
Neutro Abs: 19.8 — ABNORMAL HIGH
Neutro Abs: 7.1
Neutrophils Relative %: 64
Neutrophils Relative %: 66

## 2011-08-12 LAB — URINE CULTURE
Colony Count: NO GROWTH
Culture: NO GROWTH
Special Requests: NEGATIVE

## 2011-08-12 LAB — CULTURE, BAL-QUANTITATIVE W GRAM STAIN
Colony Count: NO GROWTH
Culture: NO GROWTH

## 2011-08-12 LAB — COMPREHENSIVE METABOLIC PANEL
ALT: 55 — ABNORMAL HIGH
BUN: 19
CO2: 23
Calcium: 9.4
GFR calc non Af Amer: 40 — ABNORMAL LOW
Glucose, Bld: 148 — ABNORMAL HIGH
Sodium: 134 — ABNORMAL LOW

## 2011-08-12 LAB — HEMOGLOBIN A1C
Hgb A1c MFr Bld: 6.5 — ABNORMAL HIGH
Mean Plasma Glucose: 140

## 2011-08-12 LAB — LIPASE, BLOOD: Lipase: 13

## 2011-08-12 LAB — AMYLASE: Amylase: 58

## 2011-08-12 LAB — LEGIONELLA ANTIGEN, URINE

## 2011-08-12 LAB — PROTIME-INR: Prothrombin Time: 14

## 2011-08-14 ENCOUNTER — Encounter (INDEPENDENT_AMBULATORY_CARE_PROVIDER_SITE_OTHER): Payer: Self-pay | Admitting: General Surgery

## 2011-08-15 ENCOUNTER — Ambulatory Visit (INDEPENDENT_AMBULATORY_CARE_PROVIDER_SITE_OTHER): Payer: Self-pay | Admitting: General Surgery

## 2011-08-15 ENCOUNTER — Encounter (INDEPENDENT_AMBULATORY_CARE_PROVIDER_SITE_OTHER): Payer: Self-pay | Admitting: General Surgery

## 2011-08-22 LAB — I-STAT 8, (EC8 V) (CONVERTED LAB)
BUN: 9
Bicarbonate: 22.5
Glucose, Bld: 144 — ABNORMAL HIGH
Hemoglobin: 15.3
Sodium: 141

## 2011-08-22 LAB — DIFFERENTIAL
Basophils Absolute: 0
Eosinophils Relative: 0
Lymphocytes Relative: 10 — ABNORMAL LOW
Neutro Abs: 5.9
Neutrophils Relative %: 90 — ABNORMAL HIGH

## 2011-08-22 LAB — RAPID URINE DRUG SCREEN, HOSP PERFORMED
Barbiturates: NOT DETECTED
Benzodiazepines: NOT DETECTED

## 2011-08-22 LAB — POCT I-STAT CREATININE: Creatinine, Ser: 0.8

## 2011-08-22 LAB — CBC
HCT: 39.8
Platelets: 345
RDW: 14.4 — ABNORMAL HIGH

## 2011-08-23 ENCOUNTER — Telehealth: Payer: Self-pay | Admitting: Internal Medicine

## 2011-08-23 NOTE — Telephone Encounter (Signed)
Saw Dr. Elnoria Howard this week in clinic,  Given methylprednisone taper. Not helping much with the pain. Has increased appetite. No nausea,vomiting Abd pain is epigastric.  No heartburn.   Able to eat, pain is only a little worse with eating.  No hematemesis or melena. No f/c/ns. They discussed EGD as possible next option. I have recommended OTC acid suppression with Zantac 1 tab po BID. If not helping or worse, then his option is to return to the ED for eval. He voices understanding. I will notify Dr. Elnoria Howard of the call. Questions answered and pt thanked me for the call.

## 2011-08-25 LAB — RAPID URINE DRUG SCREEN, HOSP PERFORMED
Amphetamines: NOT DETECTED
Barbiturates: NOT DETECTED
Benzodiazepines: NOT DETECTED
Opiates: POSITIVE — AB
Tetrahydrocannabinol: POSITIVE — AB

## 2011-08-25 LAB — COMPREHENSIVE METABOLIC PANEL
ALT: 83 — ABNORMAL HIGH
AST: 19
AST: 30
Albumin: 2.7 — ABNORMAL LOW
Albumin: 2.8 — ABNORMAL LOW
Alkaline Phosphatase: 73
BUN: 13
CO2: 29
Calcium: 8.5
Chloride: 104
Creatinine, Ser: 0.89
GFR calc Af Amer: >60
Potassium: 3.7
Sodium: 141
Total Bilirubin: 0.3
Total Protein: 6.2

## 2011-08-25 LAB — SEDIMENTATION RATE
Sed Rate: 30 — ABNORMAL HIGH
Sed Rate: 40 — ABNORMAL HIGH

## 2011-08-25 LAB — CBC
HCT: 38 — ABNORMAL LOW
Hemoglobin: 12.6 — ABNORMAL LOW
MCHC: 33.5
MCV: 82
Platelets: 319
Platelets: 345
RBC: 4.55
WBC: 15 — ABNORMAL HIGH

## 2011-08-25 LAB — URINALYSIS, ROUTINE W REFLEX MICROSCOPIC
Bilirubin Urine: NEGATIVE
Glucose, UA: NEGATIVE
Hgb urine dipstick: NEGATIVE
Protein, ur: NEGATIVE
Specific Gravity, Urine: 1.016
Urobilinogen, UA: 1

## 2011-08-25 LAB — DIFFERENTIAL
Basophils Absolute: 0.1
Basophils Relative: 1
Eosinophils Relative: 0
Eosinophils Relative: 1
Lymphocytes Relative: 12
Lymphs Abs: 1.4
Monocytes Absolute: 0.6
Monocytes Absolute: 1.1 — ABNORMAL HIGH
Monocytes Relative: 5
Neutro Abs: 10.3 — ABNORMAL HIGH

## 2011-08-25 NOTE — Telephone Encounter (Signed)
Faxed attached note to Dr Elnoria Howard, [phone].

## 2011-09-25 IMAGING — CT CT HEAD W/O CM
2 series · 16 of 30 positions shown, 20 images · non-contrast
Comparison: none

[Series 2: head w/o · axial · non-contrast · 0.49mm/px · z∈[+40,+177]mm · 13 of 32 slices shown, 17 images]
[im 3/32  brain]
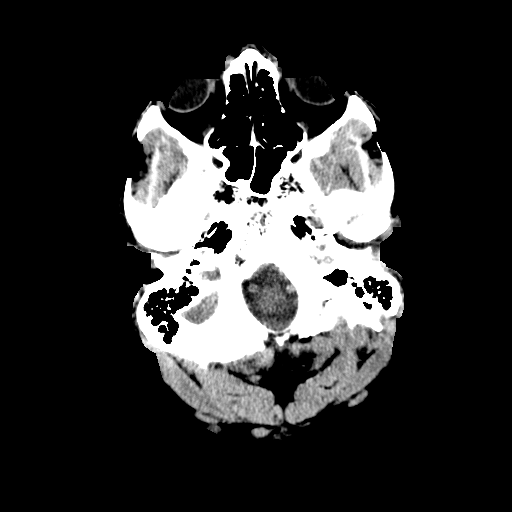
[im 3/32  bone]
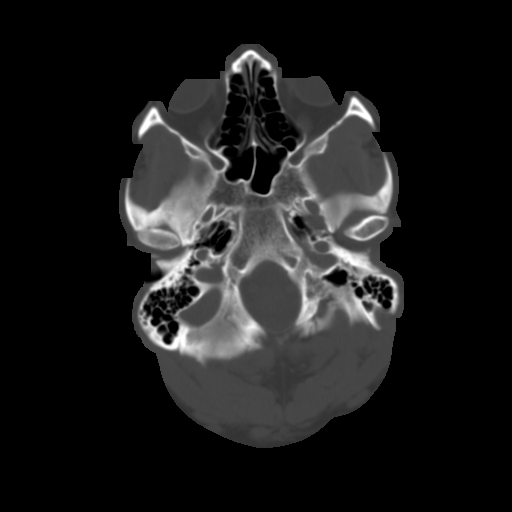
[im 5/32  brain]
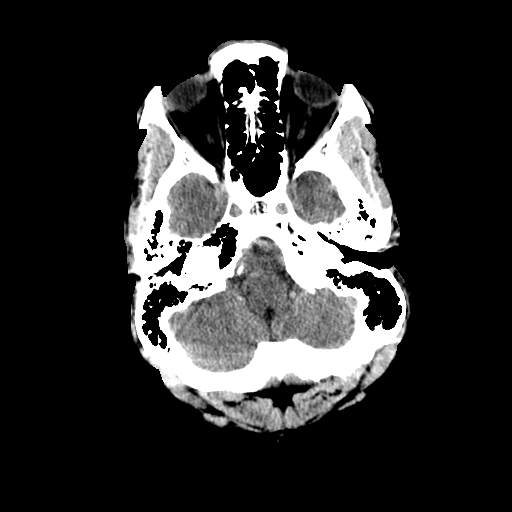
[im 7/32  brain]
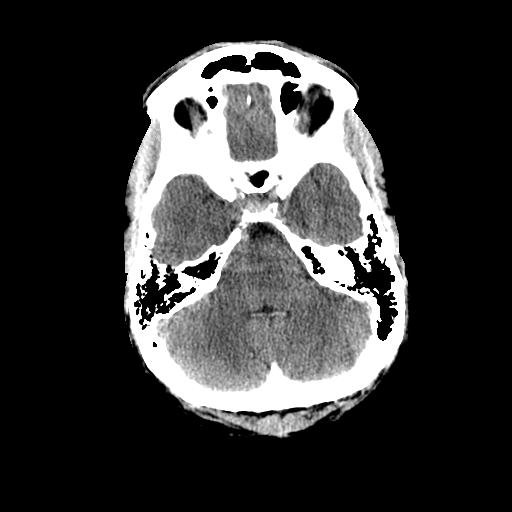
[im 9/32  brain]
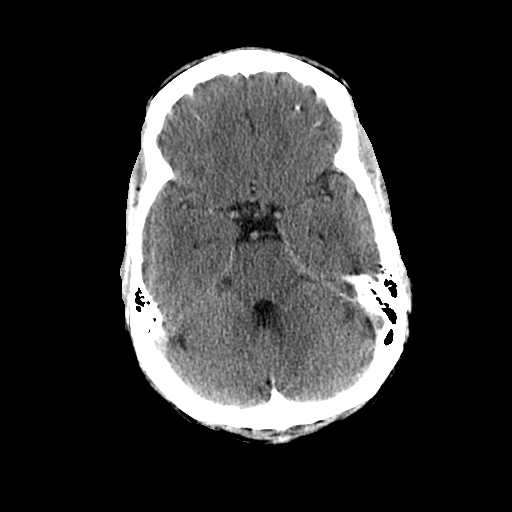
[im 12/32  brain]
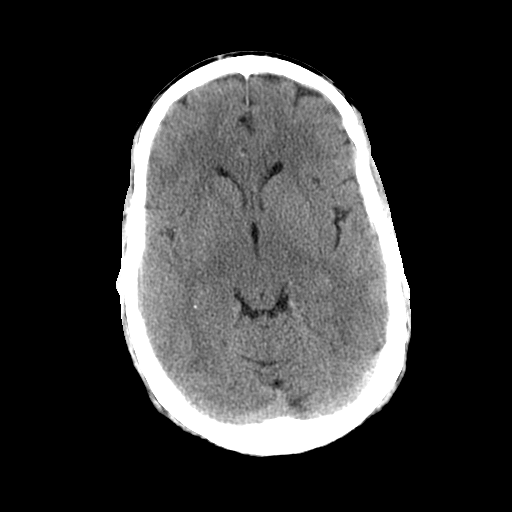
[im 12/32  bone]
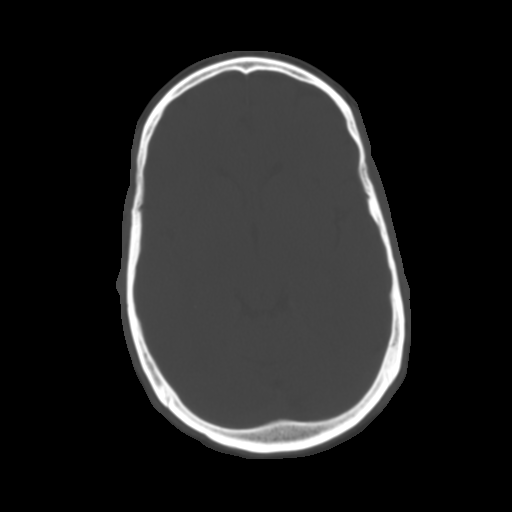
[im 14/32  brain]
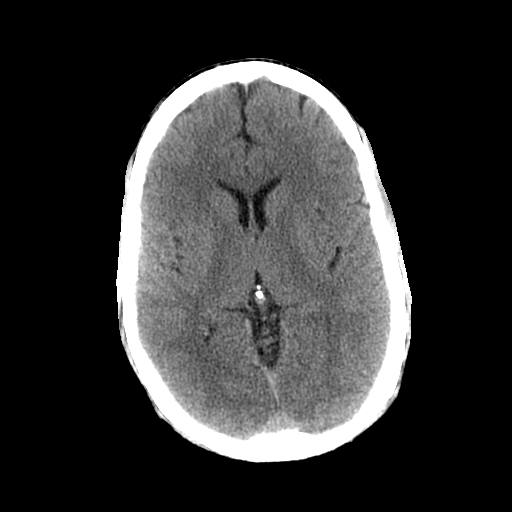
[im 16/32  brain]
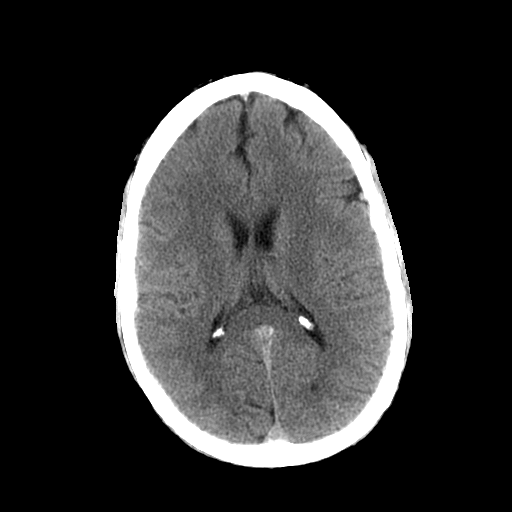
[im 18/32  brain]
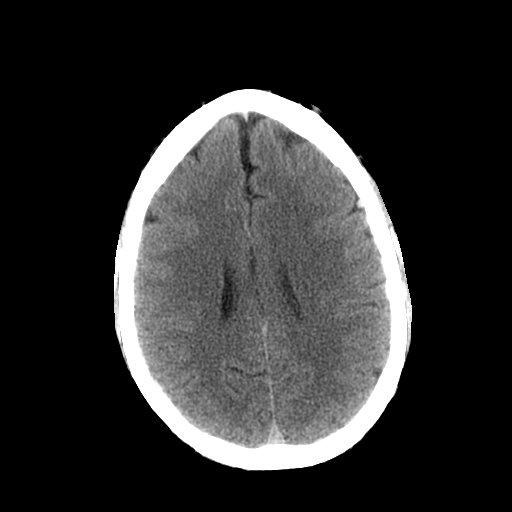
[im 20/32  brain]
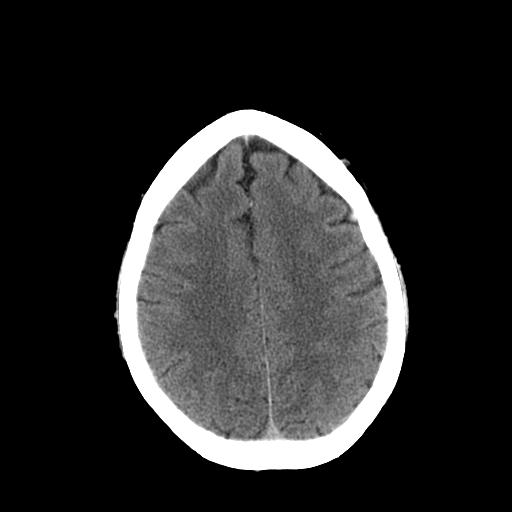
[im 20/32  bone]
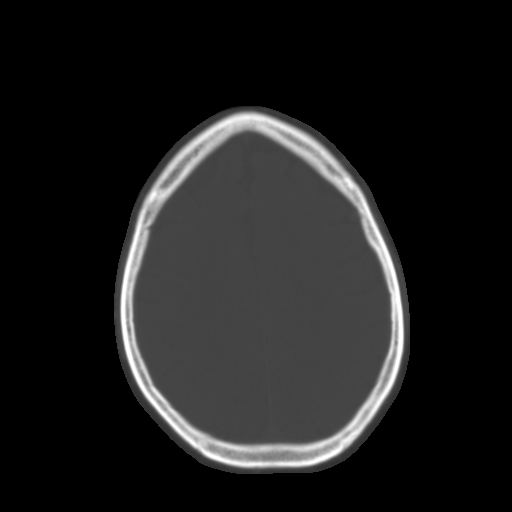
[im 23/32  brain]
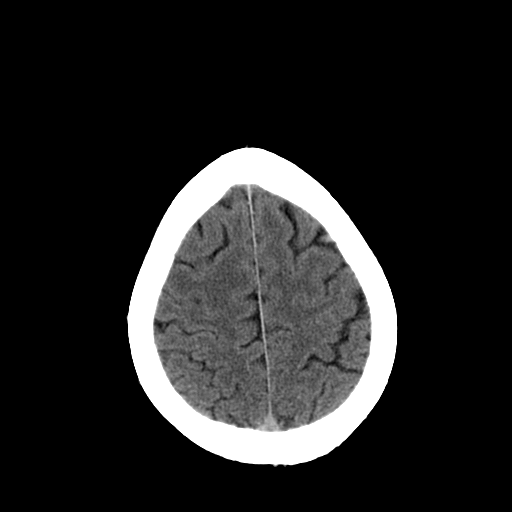
[im 25/32  brain]
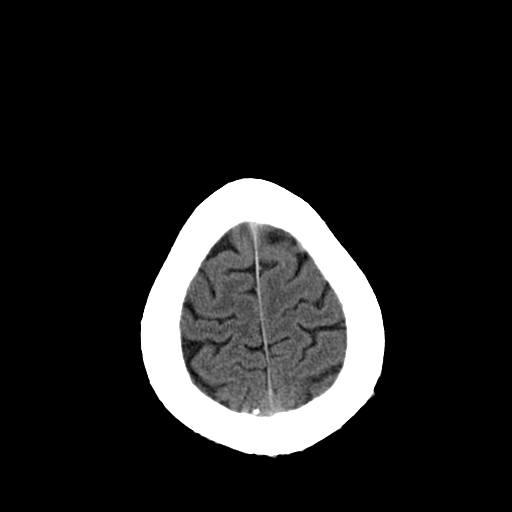
[im 27/32  brain]
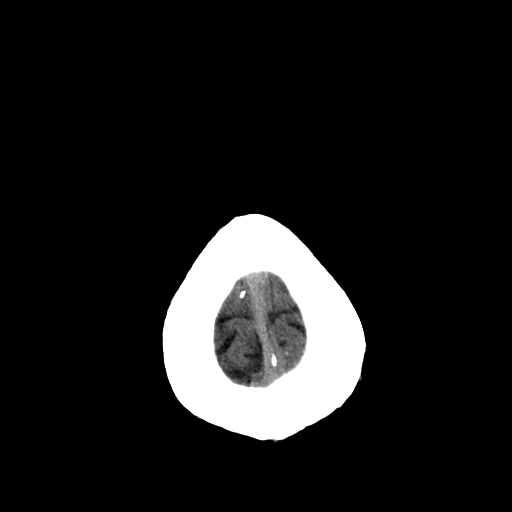
[im 29/32  brain]
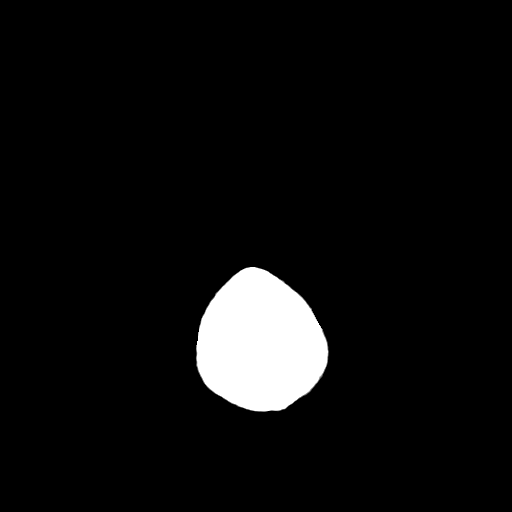
[im 29/32  bone]
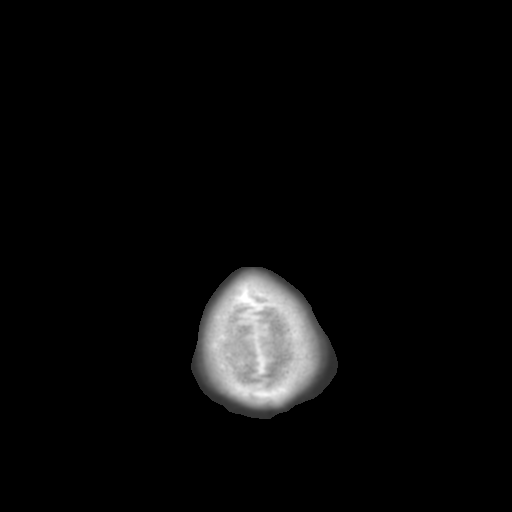

[Series 3: head bone · axial · 0.49mm/px · z∈[+40,+87]mm · 3 of 32 slices shown]
[im 3/32  bone]
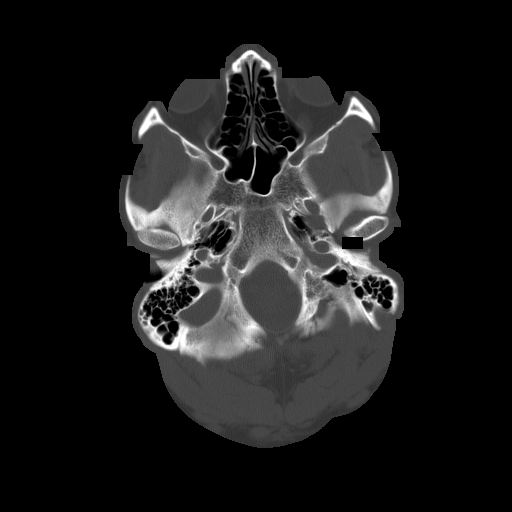
[im 7/32  bone]
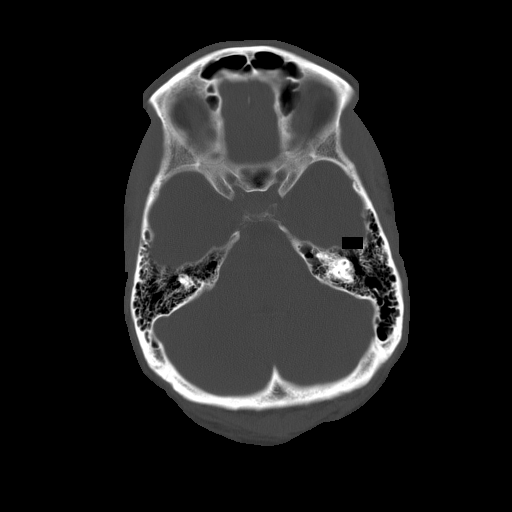
[im 12/32  bone]
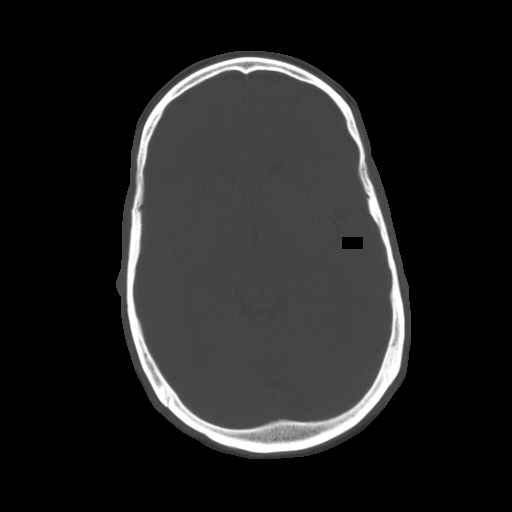

[16 of 30 positions shown; findings below may reference images not displayed]

This examination was performed at [HOSPITAL] at [HOSPITAL]. The interpretation will be provided by [REDACTED].

## 2011-10-06 ENCOUNTER — Other Ambulatory Visit: Payer: Self-pay | Admitting: Neurology

## 2011-10-06 DIAGNOSIS — G35 Multiple sclerosis: Secondary | ICD-10-CM

## 2011-10-06 DIAGNOSIS — M5412 Radiculopathy, cervical region: Secondary | ICD-10-CM

## 2011-10-07 ENCOUNTER — Other Ambulatory Visit: Payer: Self-pay | Admitting: Neurology

## 2011-10-07 DIAGNOSIS — M5412 Radiculopathy, cervical region: Secondary | ICD-10-CM

## 2011-10-07 DIAGNOSIS — G35 Multiple sclerosis: Secondary | ICD-10-CM

## 2011-10-14 ENCOUNTER — Other Ambulatory Visit: Payer: Medicare Other

## 2011-10-17 ENCOUNTER — Ambulatory Visit
Admission: RE | Admit: 2011-10-17 | Discharge: 2011-10-17 | Disposition: A | Discharge status: Home / Self Care | Payer: Medicare Other | Source: Ambulatory Visit | Attending: Neurology | Admitting: Neurology

## 2011-10-17 DIAGNOSIS — G35 Multiple sclerosis: Secondary | ICD-10-CM

## 2011-10-17 DIAGNOSIS — M5412 Radiculopathy, cervical region: Secondary | ICD-10-CM

## 2011-10-17 MED ORDER — GADOBENATE DIMEGLUMINE 529 MG/ML IV SOLN
18.0000 mL | Freq: Once | INTRAVENOUS | Status: AC | PRN
  Administered 2011-10-17: 18 mL via INTRAVENOUS

## 2012-03-24 ENCOUNTER — Other Ambulatory Visit: Payer: Self-pay | Admitting: Neurology

## 2012-03-24 DIAGNOSIS — G35 Multiple sclerosis: Secondary | ICD-10-CM

## 2012-03-24 DIAGNOSIS — M5481 Occipital neuralgia: Secondary | ICD-10-CM

## 2012-03-24 DIAGNOSIS — M79609 Pain in unspecified limb: Secondary | ICD-10-CM

## 2012-03-31 ENCOUNTER — Ambulatory Visit
Admission: RE | Admit: 2012-03-31 | Discharge: 2012-03-31 | Disposition: A | Discharge status: Home / Self Care | Payer: Medicare Other | Source: Ambulatory Visit | Attending: Neurology | Admitting: Neurology

## 2012-03-31 DIAGNOSIS — G35 Multiple sclerosis: Secondary | ICD-10-CM

## 2012-03-31 DIAGNOSIS — M79609 Pain in unspecified limb: Secondary | ICD-10-CM

## 2012-03-31 DIAGNOSIS — M5481 Occipital neuralgia: Secondary | ICD-10-CM

## 2012-03-31 MED ORDER — GADOBENATE DIMEGLUMINE 529 MG/ML IV SOLN
19.0000 mL | Freq: Once | INTRAVENOUS | Status: AC | PRN
  Administered 2012-03-31: 19 mL via INTRAVENOUS

## 2012-04-15 IMAGING — US IR VENIPUNCTURE 3YRS/OLDER BY MD
1 series · 1 of 1 positions shown · non-contrast
Comparison: None.

CLINICAL DATA: Inability to obtain venous access for the patient
prior to endoscopy procedure.  Request made for peripheral
venipuncture IV start using ultrasound guidance.

VENIPUNCTURE BY MONTINS

[Series 1: sp us guide vasc access*left* · 1 of 1 slices shown]
[im 1/1]
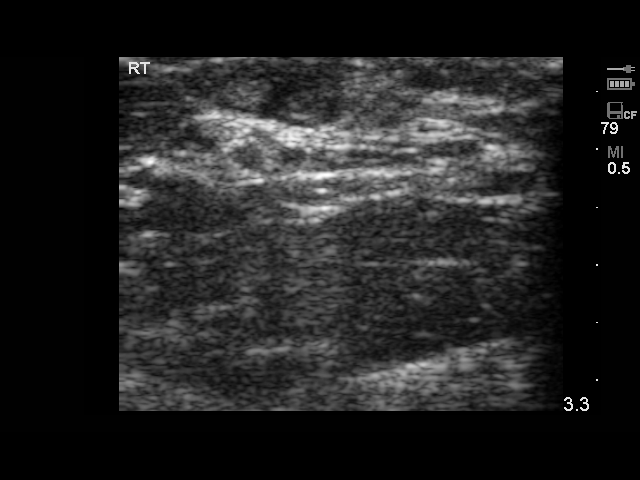

[1 of 1 positions shown; findings below may reference images not displayed]

FINDINGS: The patient's right arm was prepped and draped in the
normal sterile fashion.  1% lidocaine was used for local
anesthesia.  Using ultrasound guidance, micropuncture needle was
used to access the basilic vein.  Guide wire was advanced through
the needle over which a 4-French sheath was advanced and secured to
the skin.  The access was  flushed and aspirated without
difficulty.  Ultrasound imaging was obtained documenting placement.
A clean sterile  dressing was applied securing access to the skin.
The patient tolerated the procedure well with no immediate
complications.
IMPRESSION: Successful venipuncture of the basilic vein of the right upper
extremity under ultrasound guidance as described above.

Read by: Difilippo, Kim Allen.-SF

## 2012-05-05 IMAGING — MR MR HEAD WO/W CM
9 of 12 series · 26 of 48 positions shown · IV contrast (multihance)
Comparison: 07/17/2009 and earlier.

CLINICAL DATA: 50-year-old male with history multiple sclerosis.
Visual and auditory problems times 6 months.  No known injury.

MRI HEAD WITHOUT AND WITH CONTRAST
TECHNIQUE: Multiplanar, multiecho pulse sequences of the brain and
surrounding structures were obtained according to standard protocol
without and with intravenous contrast
Contrast: 19 ml MultiHance.

[Series 2: T1 · sagittal · 5.0mm · 0.45mm/px · 2 of 19 slices shown (1 of 2)]
[im 1/19]
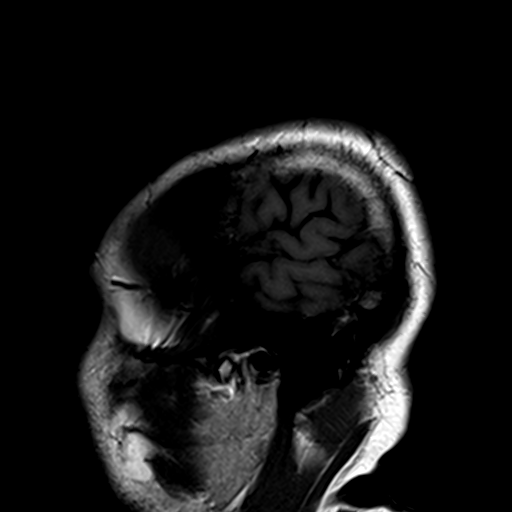
[im 19/19]
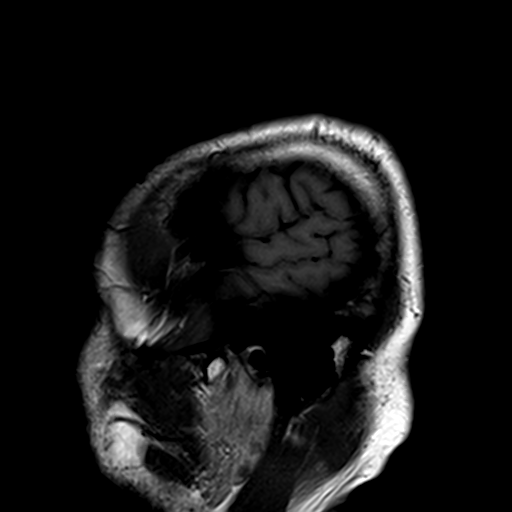

[Series 3: FLAIR · sagittal · 5.0mm · 0.45mm/px · 2 of 19 slices shown (1 of 2)]
[im 1/19]
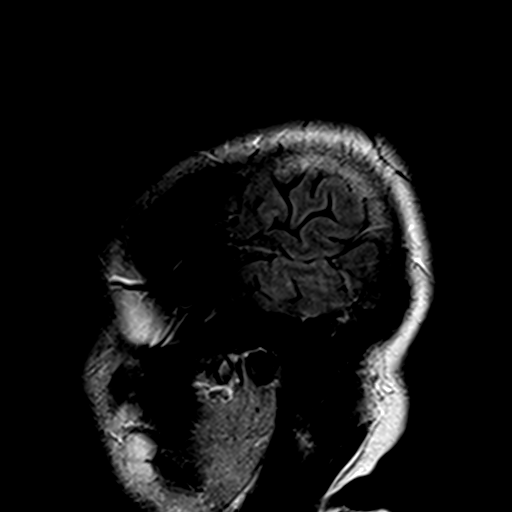
[im 19/19]
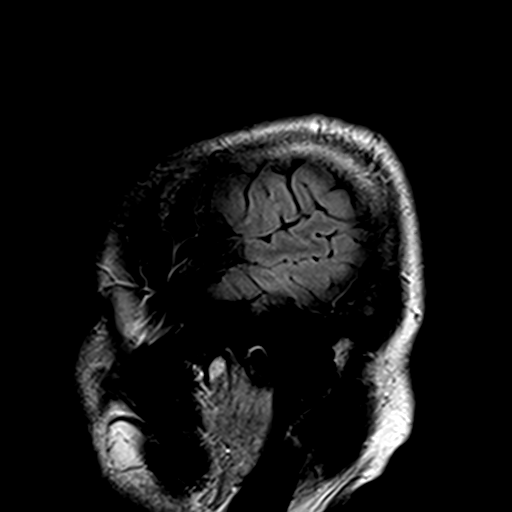

[Series 4: DWI · axial · 5.0mm · 0.90mm/px · z∈[-57,+79]mm · 6 of 44 slices shown]
[im 1/44]
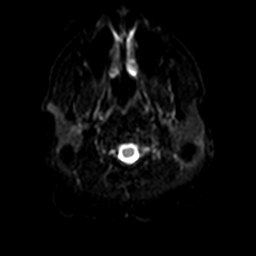
[im 9/44]
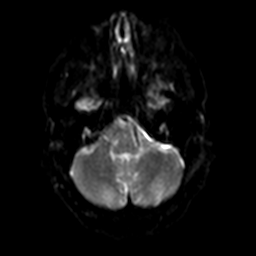
[im 18/44]
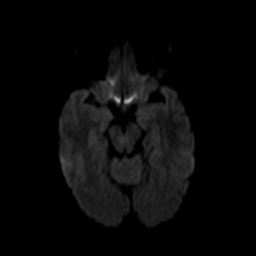
[im 26/44]
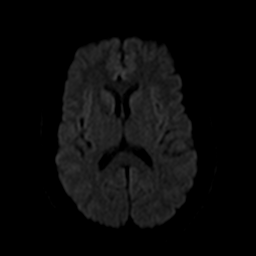
[im 35/44]
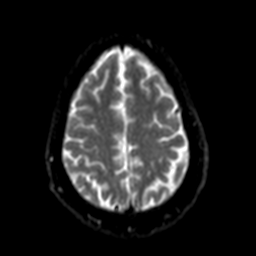
[im 44/44]
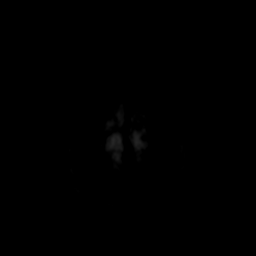

[Series 5: dwi_adc · axial · 5.0mm · 0.90mm/px · z∈[-57,+79]mm · 3 of 22 slices shown]
[im 1/22]
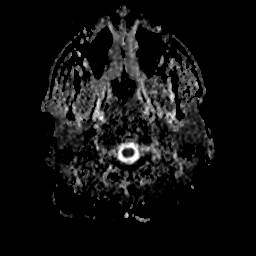
[im 11/22]
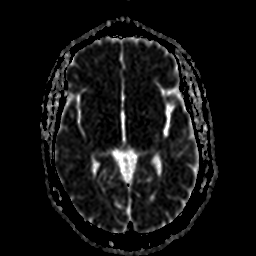
[im 22/22]
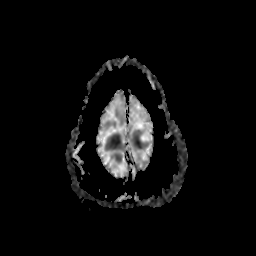

[Series 6: T2 · axial · 5.0mm · 0.45mm/px · z∈[-57,+79]mm · 3 of 22 slices shown (1 of 2)]
[im 1/22]
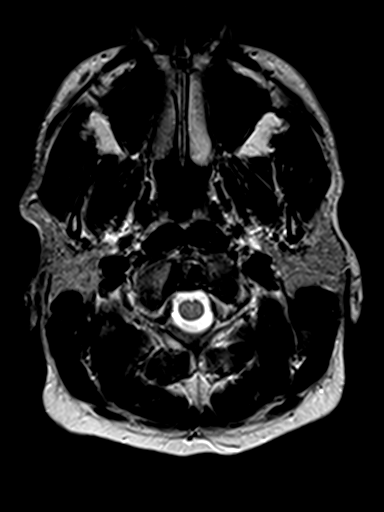
[im 11/22]
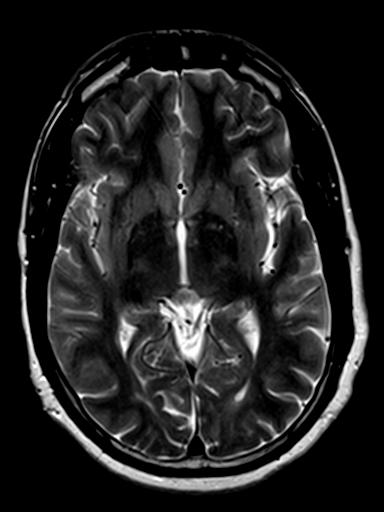
[im 22/22]
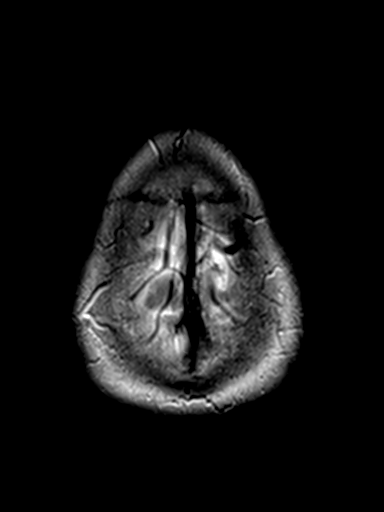

[Series 7: FLAIR · axial · 5.0mm · 0.45mm/px · z∈[-57,+79]mm · 3 of 22 slices shown (2 of 2)]
[im 1/22]
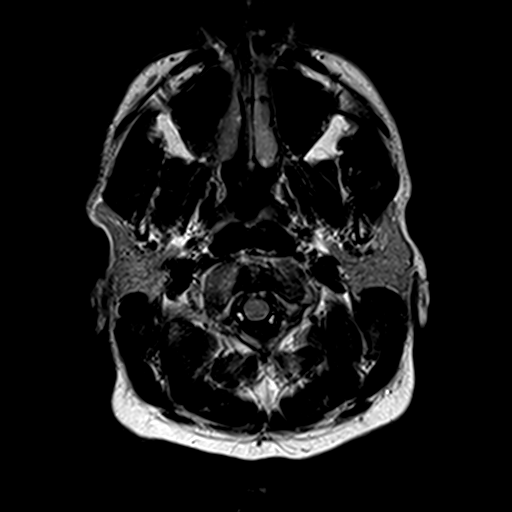
[im 11/22]
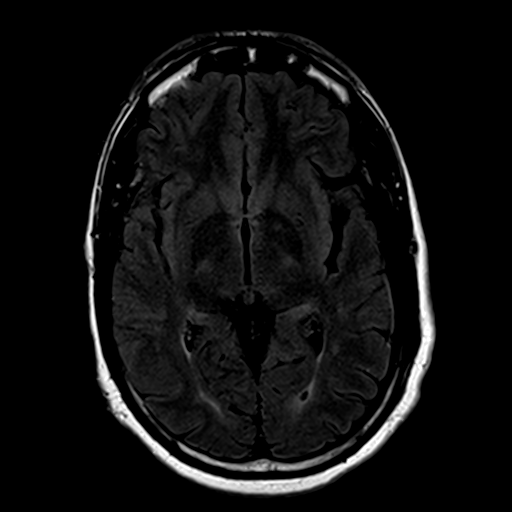
[im 22/22]
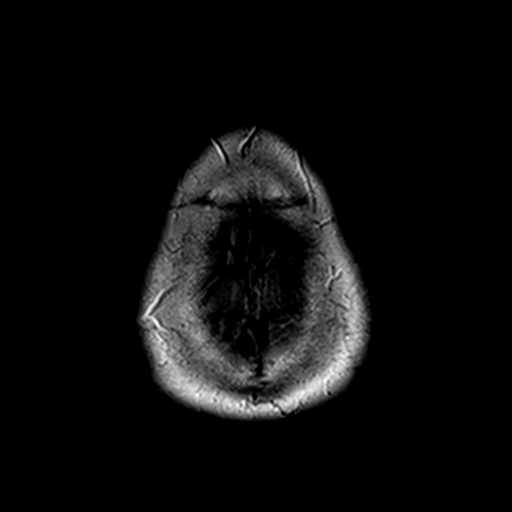

[Series 8: axial grad (blood) · axial · 5.0mm · 0.49mm/px · z∈[-56,+9]mm · 2 of 22 slices shown]
[im 1/22]
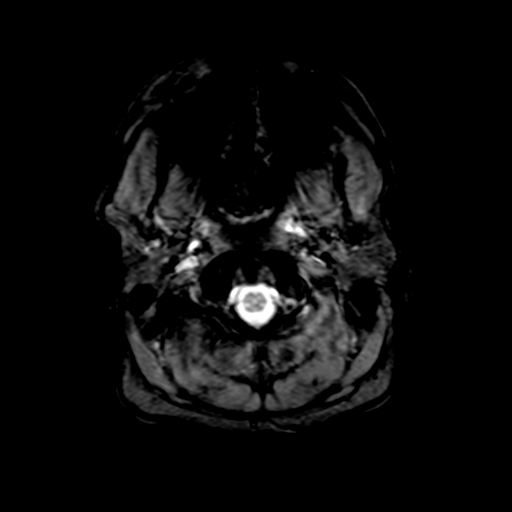
[im 11/22]
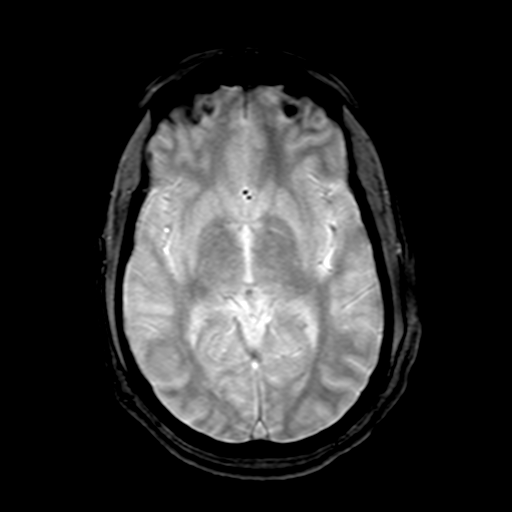

[Series 10: T2 · coronal · 5.0mm · 0.45mm/px · 3 of 24 slices shown (2 of 2)]
[im 1/24]
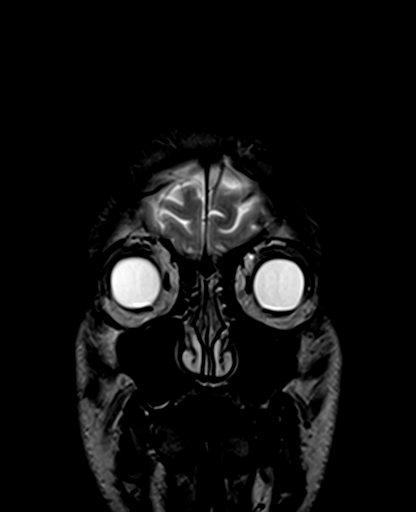
[im 12/24]
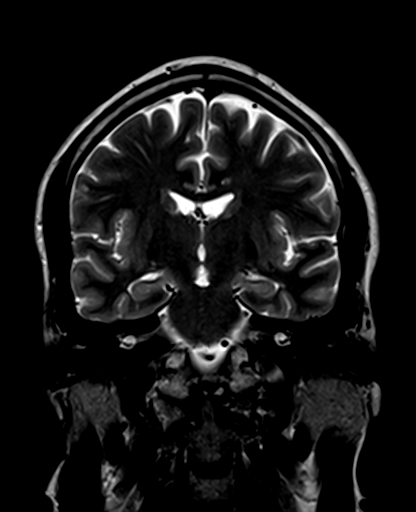
[im 24/24]
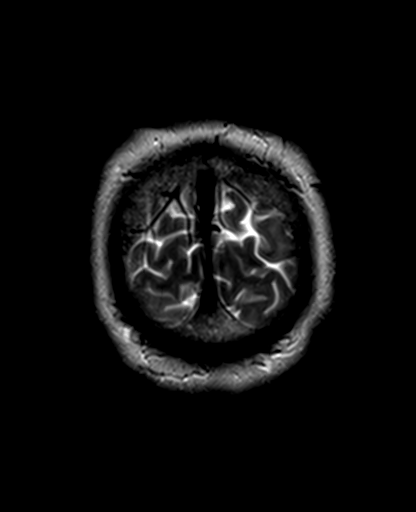

[Series 13: T1 · sagittal · 5.0mm · 0.45mm/px · 2 of 19 slices shown (2 of 2)]
[im 1/19]
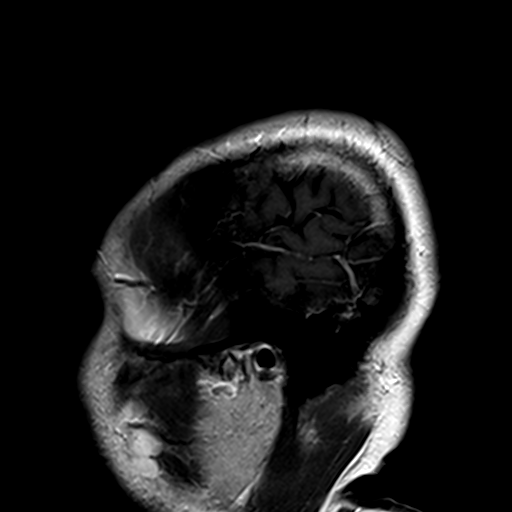
[im 19/19]
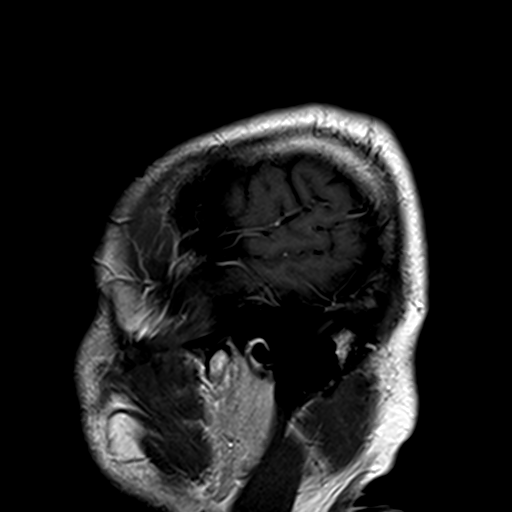

[26 of 48 positions shown; findings below may reference images not displayed]

FINDINGS: Stable prominence of the pituitary gland without discrete
pituitary mass evident with this technique. No restricted diffusion
to suggest acute infarction.  No midline shift, mass effect,
evidence of mass lesion, ventriculomegaly, extra-axial collection
or acute intracranial hemorrhage.  Cervicomedullary junction is
within normal limits.  Major intracranial vascular flow voids are
preserved.

Scattered small T2 and FLAIR hyperintense lesions in the cerebral
white matter have mildly increased (series 7 image 16 versus
previous exam series 13 image 16).  Deep gray matter nuclei and
brainstem remain within normal limits.  Cerebellum remain spared.
No associated enhancement.  Visualized cervical spine appears
grossly negative.

Visualized bone marrow signal is within normal limits.  Visualized
internal auditory structures appear grossly normal. Visualized
paranasal sinuses and mastoids are clear.  Visualized orbit soft
tissues are within normal limits.  Visualized scalp soft tissues
are within normal limits.
IMPRESSION: 1.  Mild progression of cerebral white matter lesions since 9171.
No associated enhancement.
2.  Otherwise stable and negative MRI appearance of the brain.

## 2012-06-17 IMAGING — CT CT HEAD W/O CM
1 series · 16 of 30 positions shown, 20 images · non-contrast
Comparison: 12/12/2010.

CLINICAL DATA: Headache.

CT HEAD WITHOUT CONTRAST
TECHNIQUE: Contiguous axial images were obtained from the base of
the skull through the vertex without contrast.

[Series 2: head_seq 4.5 h37s st · axial · 0.43mm/px · z∈[-138,+6]mm · 16 of 36 slices shown, 20 images]
[im 2/36  brain]
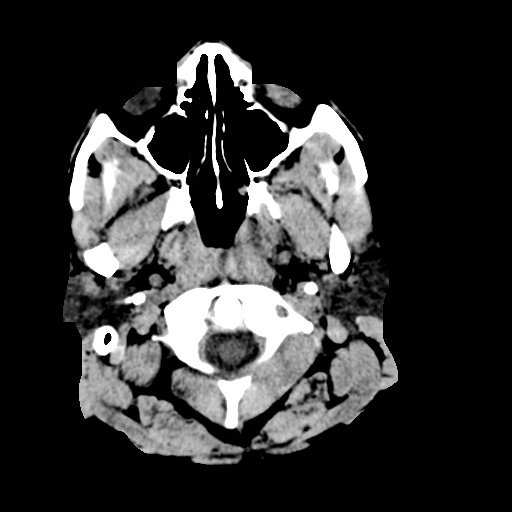
[im 2/36  bone]
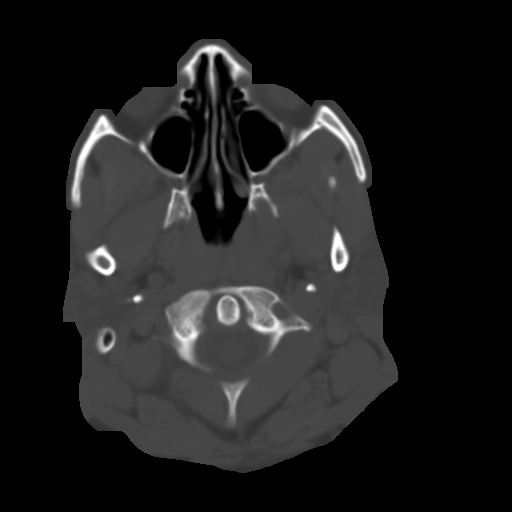
[im 4/36  brain]
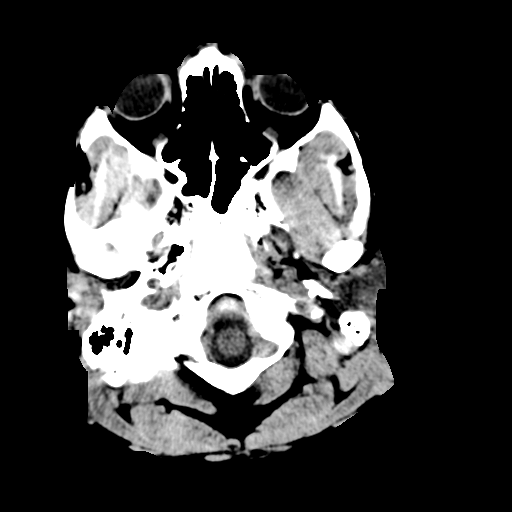
[im 7/36  brain]
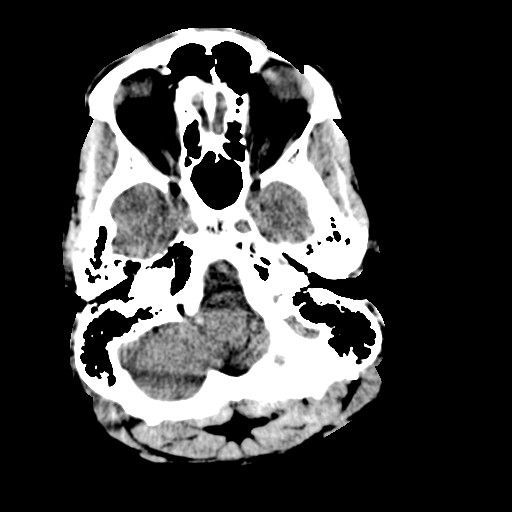
[im 9/36  brain]
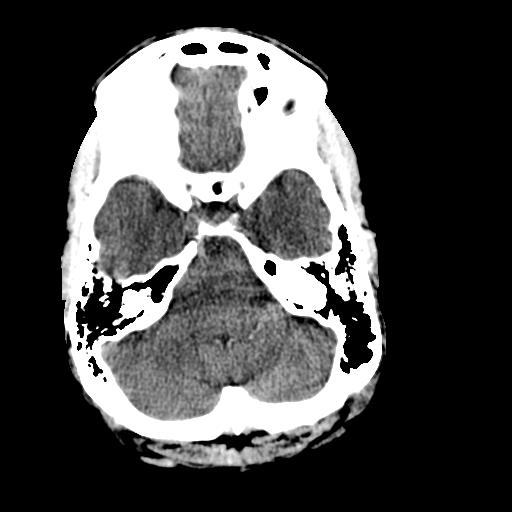
[im 10/36  brain]
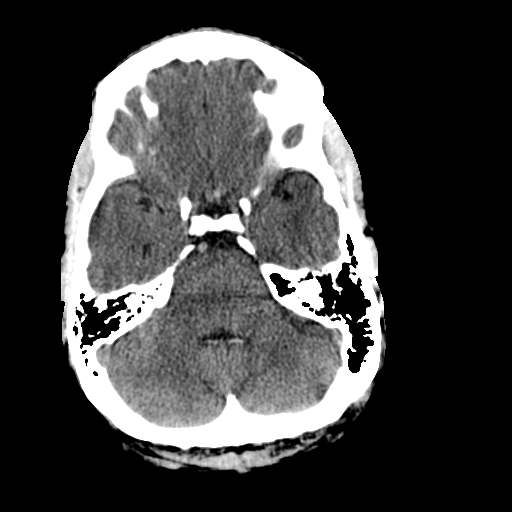
[im 10/36  bone]
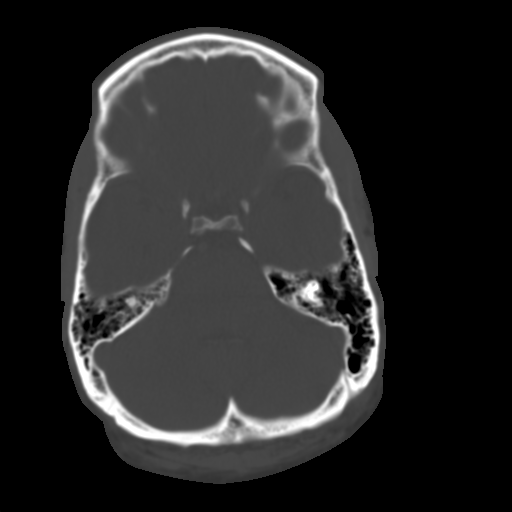
[im 13/36  brain]
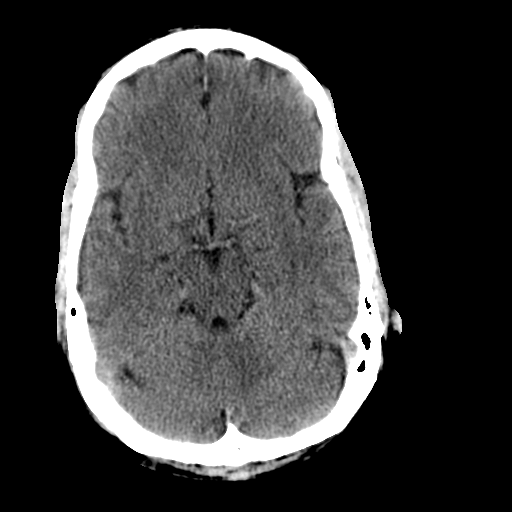
[im 15/36  brain]
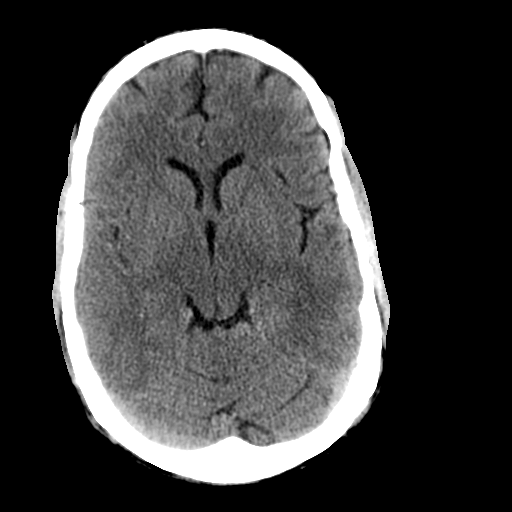
[im 17/36  brain]
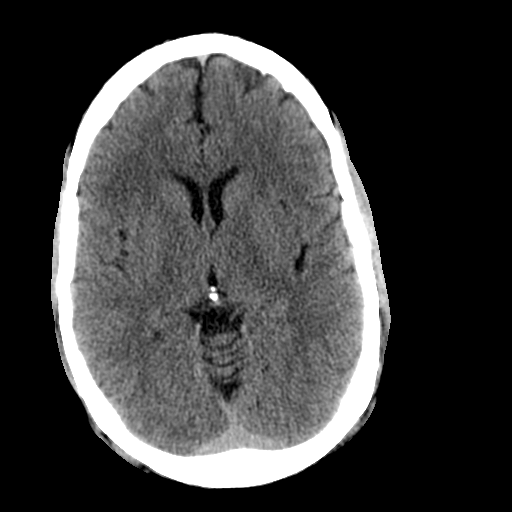
[im 19/36  brain]
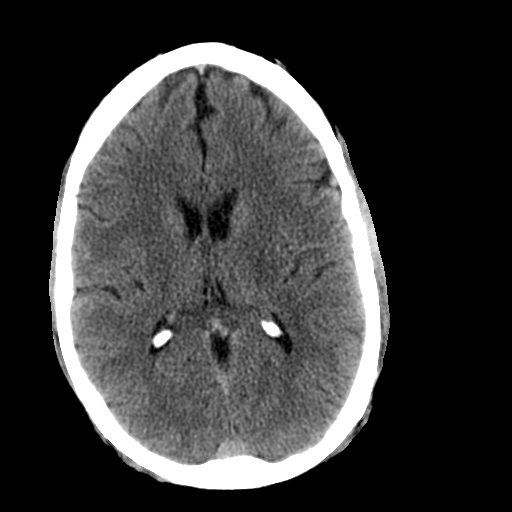
[im 19/36  bone]
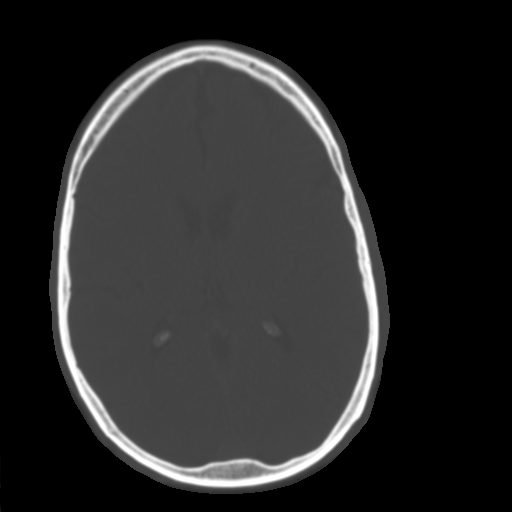
[im 21/36  brain]
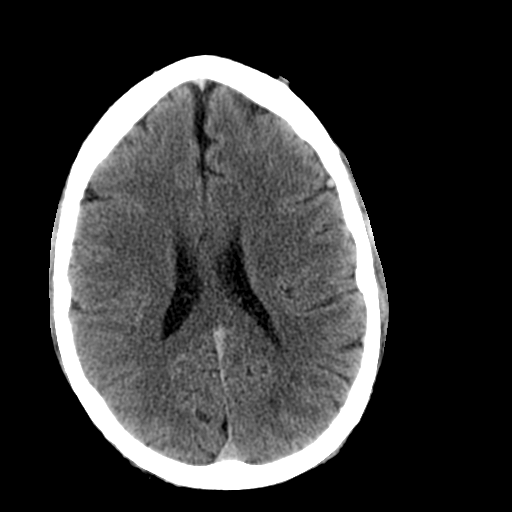
[im 23/36  brain]
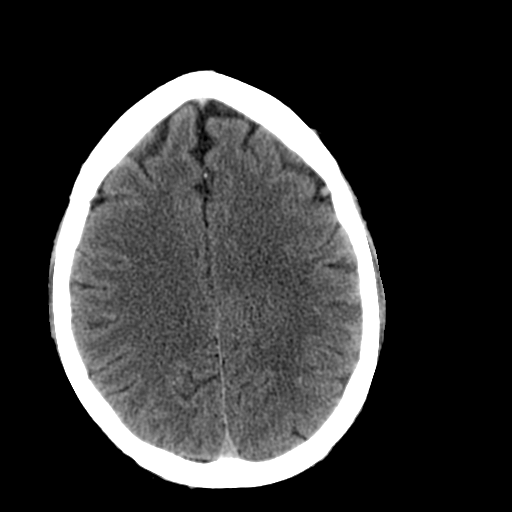
[im 26/36  brain]
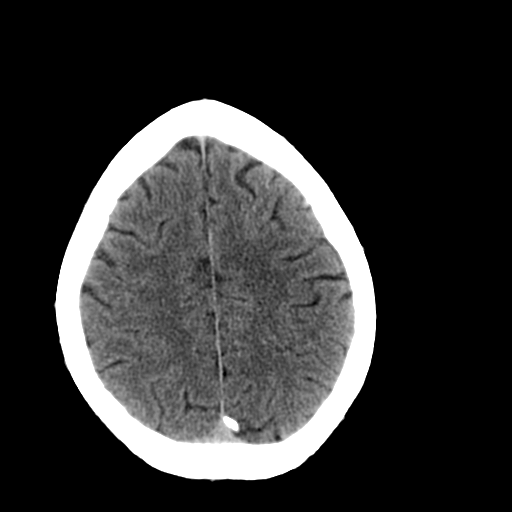
[im 27/36  brain]
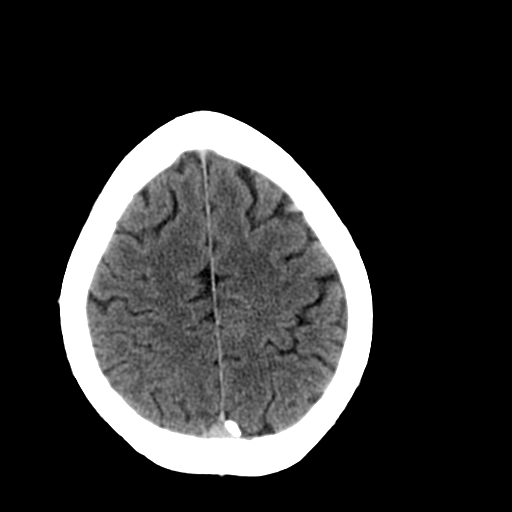
[im 27/36  bone]
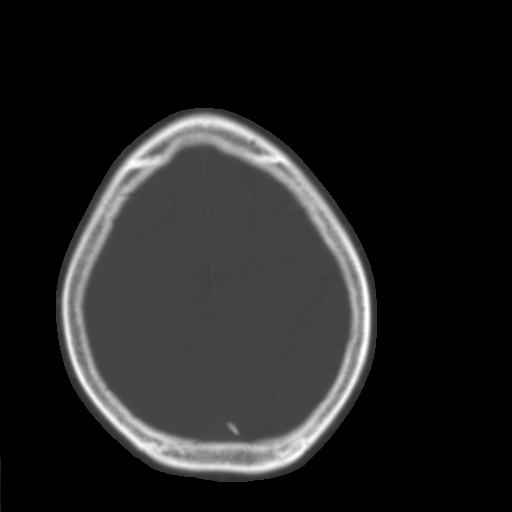
[im 29/36  brain]
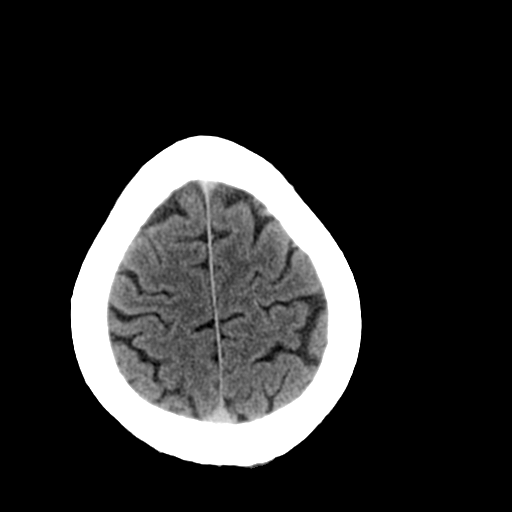
[im 32/36  brain]
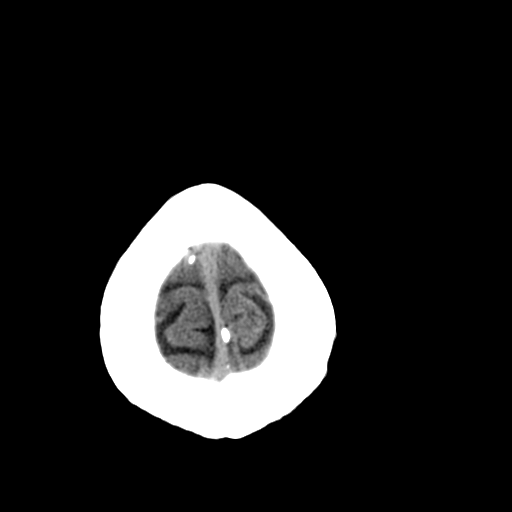
[im 34/36  brain]
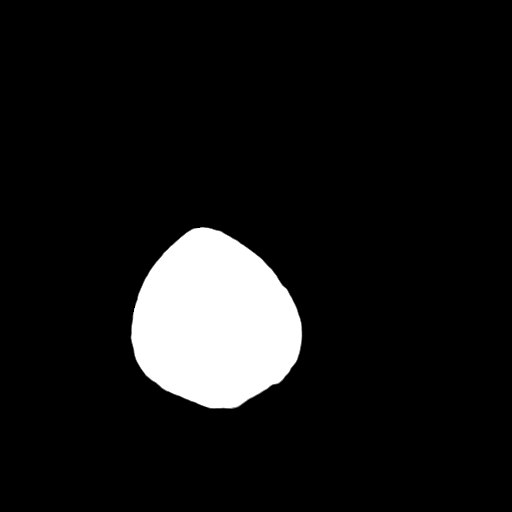

[16 of 30 positions shown; findings below may reference images not displayed]

FINDINGS: No mass lesion, mass effect, midline shift,
hydrocephalus, hemorrhage.  No territorial ischemia or acute
infarction.  Tiny centrum semiovale lesions bilaterally on prior
MRI not visible by CT.  Mastoid air cells clear.
IMPRESSION: Negative CT head.

## 2013-01-29 ENCOUNTER — Other Ambulatory Visit: Payer: Self-pay | Admitting: Neurology

## 2013-02-03 ENCOUNTER — Other Ambulatory Visit: Payer: Self-pay | Admitting: Neurology

## 2013-03-10 IMAGING — MR MR HEAD WO/W CM
18 of 22 series · 28 of 48 positions shown · IV contrast (18cc multihance)
Comparison: none

[Series 3: T1 · sagittal · 5.0mm · 0.45mm/px · 1 of 19 slices shown (1 of 5)]
[im 1/19]
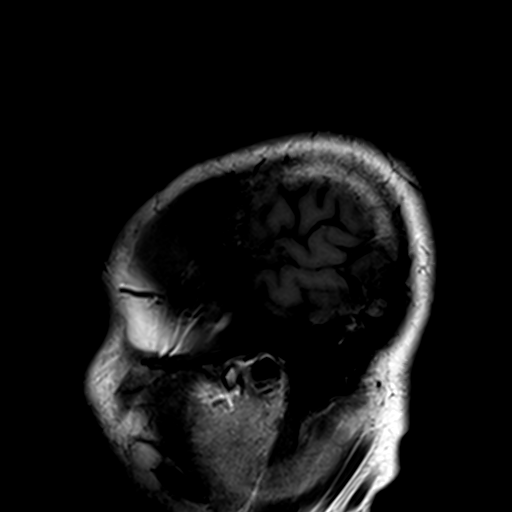

[Series 4: DWI · axial · 5.0mm · 0.90mm/px · z∈[-55,+80]mm · 2 of 44 slices shown]
[im 1/44]
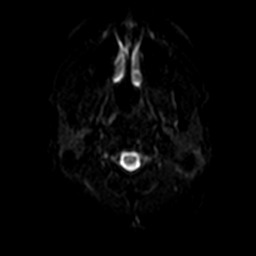
[im 44/44]
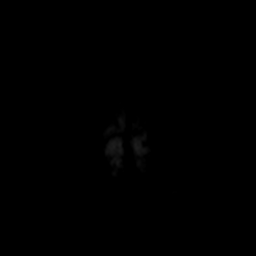

[Series 5: dwi_adc · axial · 5.0mm · 0.90mm/px · 1 of 22 slices shown]
[im 1/22]
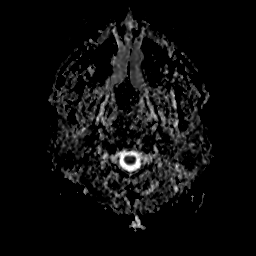

[Series 6: T2 · axial · 5.0mm · 0.45mm/px · 1 of 22 slices shown (1 of 4)]
[im 1/22]
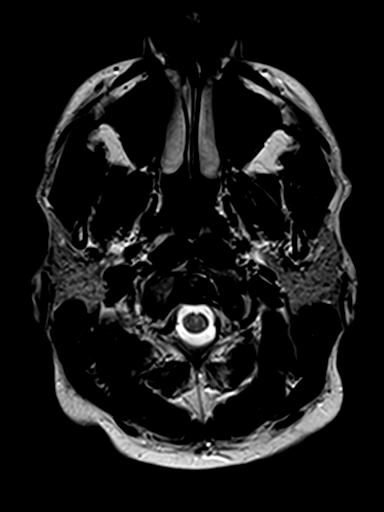

[Series 7: FLAIR · axial · 5.0mm · 0.45mm/px · z∈[-55,+80]mm · 2 of 22 slices shown (1 of 2)]
[im 1/22]
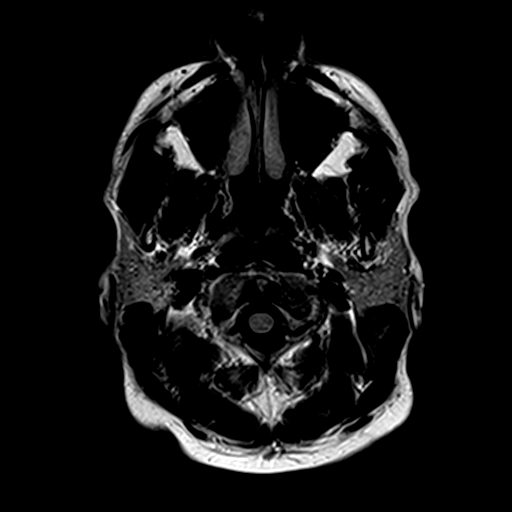
[im 22/22]
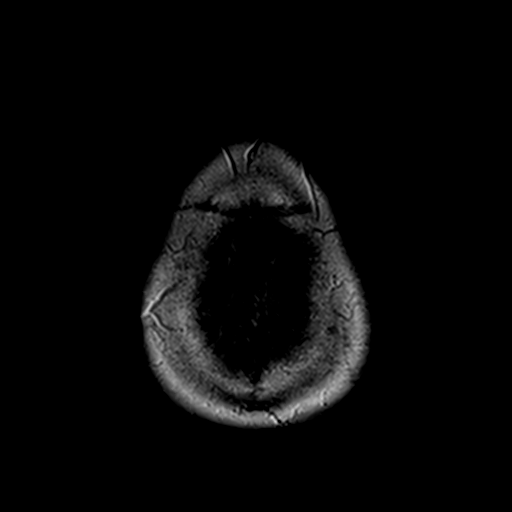

[Series 8: FLAIR · sagittal · 5.0mm · 0.45mm/px · 1 of 19 slices shown (2 of 2)]
[im 1/19]
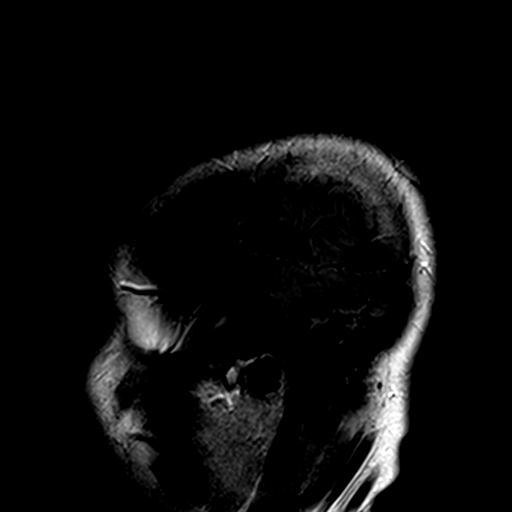

[Series 9: axial grad (blood) · axial · 5.0mm · 0.49mm/px · z∈[-54,+81]mm · 2 of 22 slices shown]
[im 1/22]
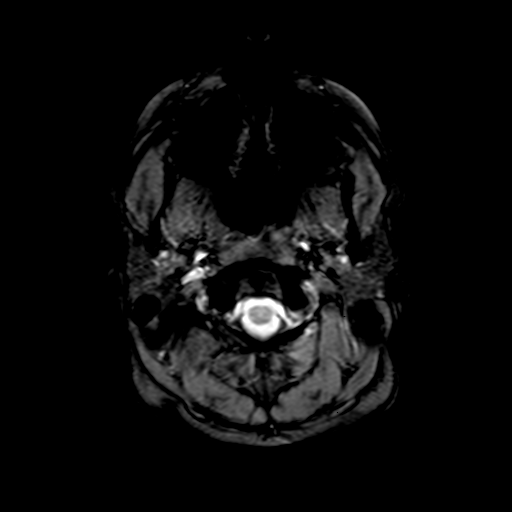
[im 22/22]
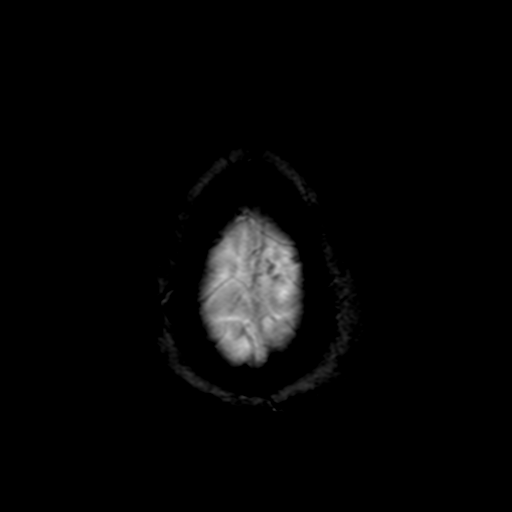

[Series 10: axial (person_name) · axial · 2.0mm · 0.45mm/px · z∈[-59,+11]mm · 3 of 72 slices shown]
[im 1/72]
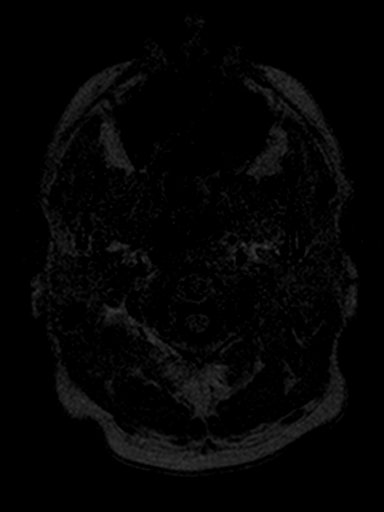
[im 18/72]
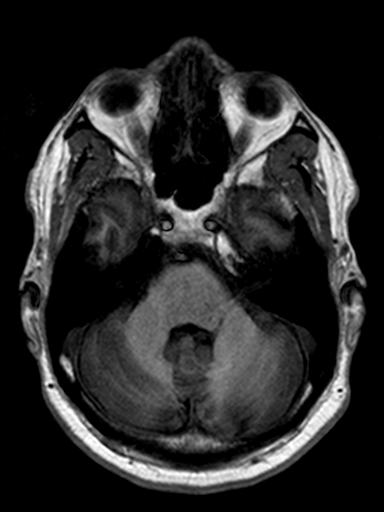
[im 36/72]
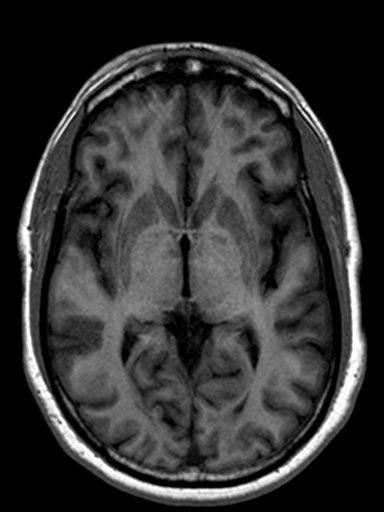

[Series 14: T2 · sagittal · 3.0mm · 0.41mm/px · 1 of 12 slices shown (2 of 4)]
[im 1/12]
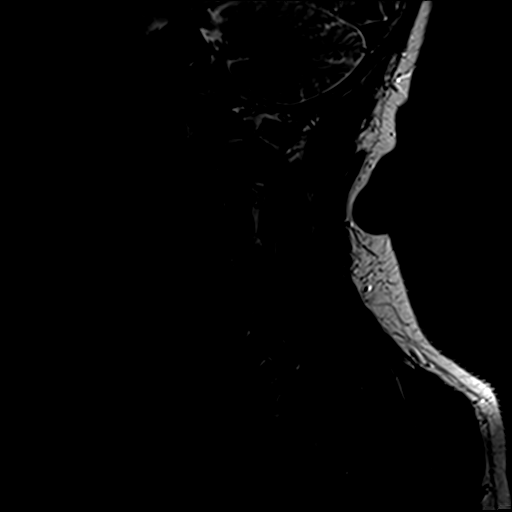

[Series 15: T1 · sagittal · 3.0mm · 0.41mm/px · 1 of 12 slices shown (2 of 5)]
[im 1/12]
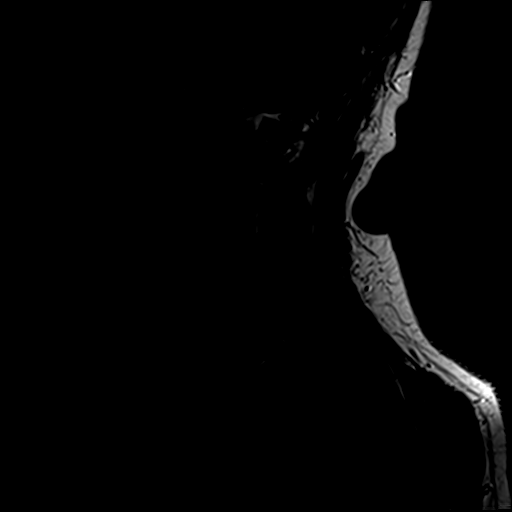

[Series 16: STIR · sagittal · 3.0mm · 0.82mm/px · 1 of 12 slices shown]
[im 1/12]
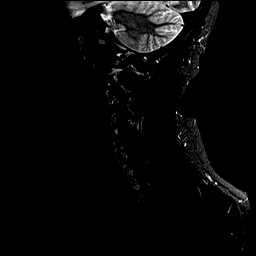

[Series 17: T2 · axial · 3.0mm · 0.47mm/px · z∈[-216,-128]mm · 2 of 24 slices shown (3 of 4)]
[im 1/24]
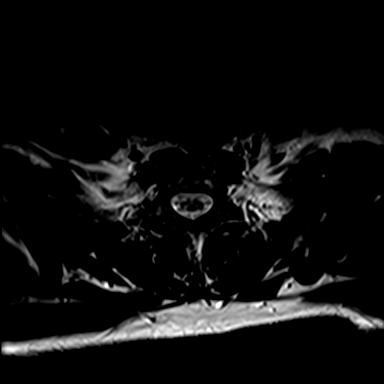
[im 24/24]
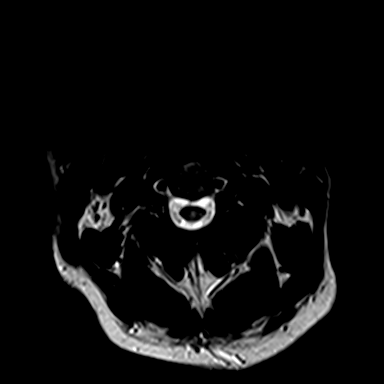

[Series 18: GRE · axial · 3.0mm · 0.47mm/px · z∈[-216,-128]mm · 2 of 24 slices shown]
[im 1/24]
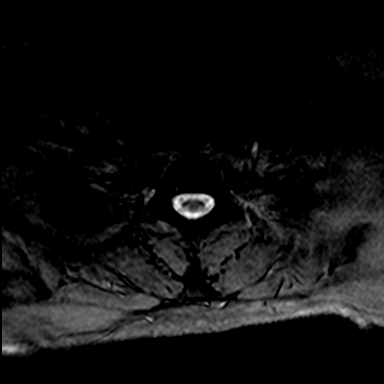
[im 24/24]
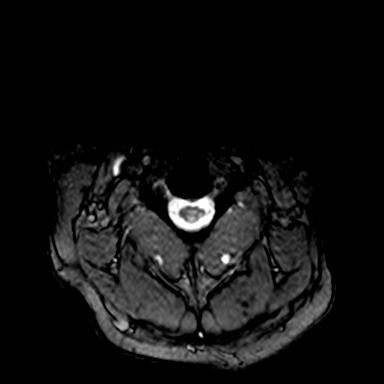

[Series 20: T1 · axial · 3.0mm · 0.47mm/px · z∈[-216,-128]mm · 2 of 24 slices shown (3 of 5)]
[im 1/24]
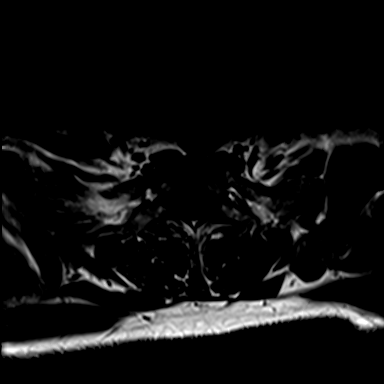
[im 24/24]
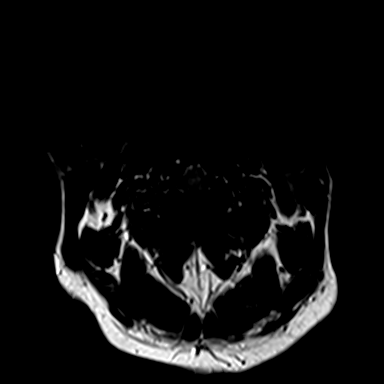

[Series 21: T2 · coronal · 5.0mm · 0.45mm/px · 2 of 24 slices shown (4 of 4)]
[im 1/24]
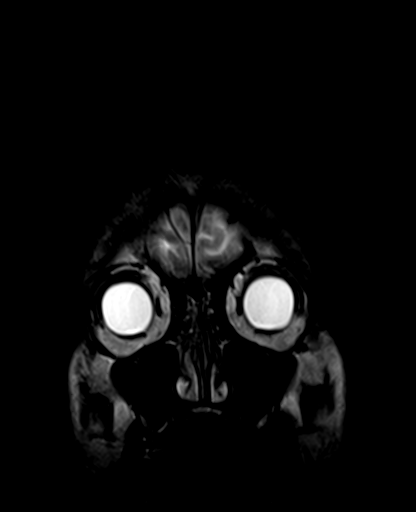
[im 24/24]
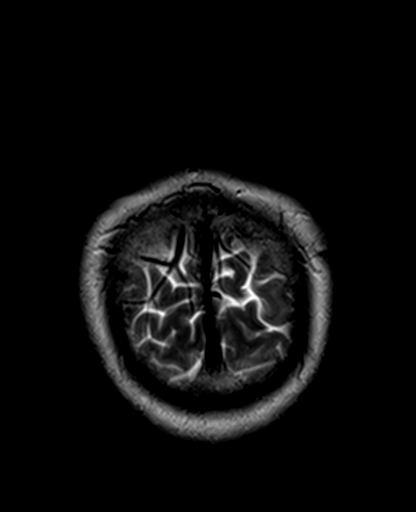

[Series 25: T1 · sagittal · 5.0mm · 0.45mm/px · 1 of 19 slices shown (4 of 5)]
[im 1/19]
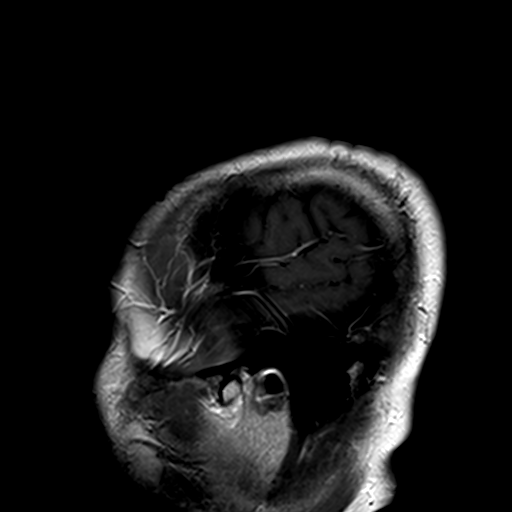

[Series 27: T1 post-contrast · sagittal · 3.0mm · 0.41mm/px · 1 of 12 slices shown]
[im 1/12]
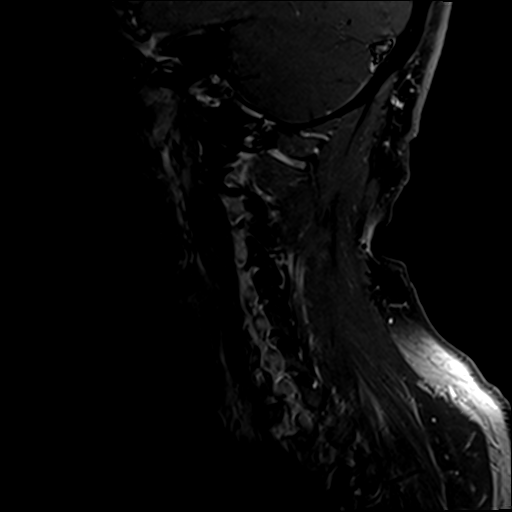

[Series 28: T1 · axial · 3.0mm · 0.47mm/px · z∈[-224,-135]mm · 2 of 24 slices shown (5 of 5)]
[im 1/24]
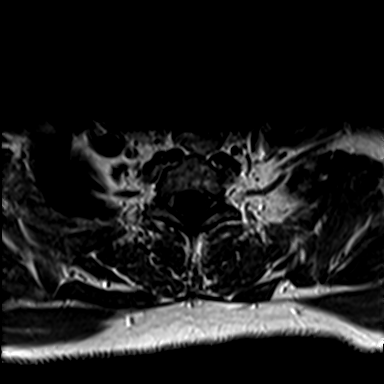
[im 24/24]
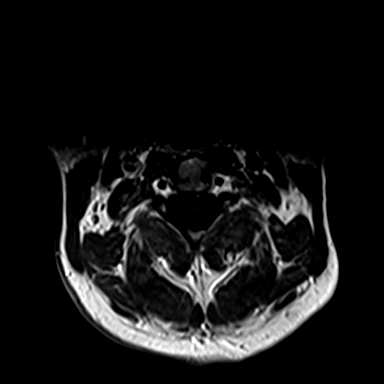

[28 of 48 positions shown; findings below may reference images not displayed]

This examination was performed at [HOSPITAL] at [HOSPITAL]. The interpretation will be provided by [REDACTED]

## 2013-03-28 ENCOUNTER — Other Ambulatory Visit: Payer: Self-pay | Admitting: Neurology

## 2013-04-17 ENCOUNTER — Other Ambulatory Visit: Payer: Self-pay | Admitting: Neurology

## 2013-04-19 NOTE — Telephone Encounter (Signed)
Former [facility] Patient, has not been assigned new MD.  Berkley Harvey refills via [facility]

## 2013-06-10 ENCOUNTER — Telehealth: Payer: Self-pay | Admitting: Neurology

## 2013-06-13 NOTE — Telephone Encounter (Signed)
Patient needs to be assigned and scheduled also the Pt has applied for assistance w/bathrooom modifications--need more info on patient

## 2013-06-14 NOTE — Telephone Encounter (Signed)
I called and LMVM for pt that was f/u on the reassignment for new MD.  Noted that referral had been made to MD in [place].  Please call back to let me know what he would like to do.

## 2013-06-15 ENCOUNTER — Telehealth: Payer: Self-pay | Admitting: Neurology

## 2013-06-15 MED ORDER — DALFAMPRIDINE ER 10 MG PO TB12: 10.0000 mg | ORAL_TABLET | Freq: Two times a day (BID) | ORAL | Status: DC

## 2013-06-15 NOTE — Telephone Encounter (Signed)
I called and spoke to pt and he has referral in to see this MD in [place], [place] but also wanted to see if could be seen sooner then 07/2013 for appt.  He also asked about ampyra med to be refilled and I spoke to [facility] in [place], she will see about refill.   [employer] [employer] in scheduling given Dr. Terrace Arabia as new assigned MD.  [employer], a charity organization calling about pt medical hx , for he is asking about receiving funds for bathroom remodel   Dr. Joseph Berkshire [phone].  I called and spoke to receptionist and she took the information.  They will call back as needed.   Pt relayed that this was ok.

## 2013-06-26 ENCOUNTER — Encounter (HOSPITAL_COMMUNITY): Payer: Self-pay | Admitting: Emergency Medicine

## 2013-06-26 DIAGNOSIS — R141 Gas pain: Secondary | ICD-10-CM | POA: Insufficient documentation

## 2013-06-26 DIAGNOSIS — F329 Major depressive disorder, single episode, unspecified: Secondary | ICD-10-CM | POA: Insufficient documentation

## 2013-06-26 DIAGNOSIS — G4700 Insomnia, unspecified: Secondary | ICD-10-CM | POA: Insufficient documentation

## 2013-06-26 DIAGNOSIS — Z79899 Other long term (current) drug therapy: Secondary | ICD-10-CM | POA: Insufficient documentation

## 2013-06-26 DIAGNOSIS — F17200 Nicotine dependence, unspecified, uncomplicated: Secondary | ICD-10-CM | POA: Insufficient documentation

## 2013-06-26 DIAGNOSIS — Z9089 Acquired absence of other organs: Secondary | ICD-10-CM | POA: Insufficient documentation

## 2013-06-26 DIAGNOSIS — Z8669 Personal history of other diseases of the nervous system and sense organs: Secondary | ICD-10-CM | POA: Insufficient documentation

## 2013-06-26 DIAGNOSIS — F411 Generalized anxiety disorder: Secondary | ICD-10-CM | POA: Insufficient documentation

## 2013-06-26 DIAGNOSIS — F3289 Other specified depressive episodes: Secondary | ICD-10-CM | POA: Insufficient documentation

## 2013-06-26 DIAGNOSIS — R142 Eructation: Secondary | ICD-10-CM | POA: Insufficient documentation

## 2013-06-26 DIAGNOSIS — G894 Chronic pain syndrome: Secondary | ICD-10-CM | POA: Insufficient documentation

## 2013-06-26 DIAGNOSIS — I10 Essential (primary) hypertension: Secondary | ICD-10-CM | POA: Insufficient documentation

## 2013-06-26 DIAGNOSIS — R300 Dysuria: Secondary | ICD-10-CM | POA: Insufficient documentation

## 2013-06-26 DIAGNOSIS — Z86718 Personal history of other venous thrombosis and embolism: Secondary | ICD-10-CM | POA: Insufficient documentation

## 2013-06-26 DIAGNOSIS — R6883 Chills (without fever): Secondary | ICD-10-CM | POA: Insufficient documentation

## 2013-06-26 DIAGNOSIS — Z8719 Personal history of other diseases of the digestive system: Secondary | ICD-10-CM | POA: Insufficient documentation

## 2013-06-26 DIAGNOSIS — R1031 Right lower quadrant pain: Secondary | ICD-10-CM | POA: Insufficient documentation

## 2013-06-26 DIAGNOSIS — Z86711 Personal history of pulmonary embolism: Secondary | ICD-10-CM | POA: Insufficient documentation

## 2013-06-26 LAB — CBC WITH DIFFERENTIAL/PLATELET
Basophils Absolute: 0 10*3/uL (ref 0.0–0.1)
Basophils Relative: 0 % (ref 0–1)
Eosinophils Relative: 2 % (ref 0–5)
HCT: 46.6 % (ref 39.0–52.0)
MCHC: 33.7 g/dL (ref 30.0–36.0)
MCV: 81.6 fL (ref 78.0–100.0)
Monocytes Absolute: 0.6 10*3/uL (ref 0.1–1.0)
RDW: 14.9 % (ref 11.5–15.5)

## 2013-06-26 LAB — COMPREHENSIVE METABOLIC PANEL
AST: 37 U/L (ref 0–37)
Albumin: 4 g/dL (ref 3.5–5.2)
Calcium: 9.5 mg/dL (ref 8.4–10.5)
Creatinine, Ser: 0.83 mg/dL (ref 0.50–1.35)

## 2013-06-26 NOTE — ED Notes (Signed)
PT. REPORTS RLQ PAIN ONSET YESTERDAY , DENIES NAUSEA OR VOMITTING , RESPIRATIONS UNLABORED , NO DIARRHEA OR FEVER .

## 2013-06-27 ENCOUNTER — Encounter (HOSPITAL_COMMUNITY): Payer: Self-pay | Admitting: Radiology

## 2013-06-27 ENCOUNTER — Emergency Department (HOSPITAL_COMMUNITY)
Admission: EM | Admit: 2013-06-27 | Discharge: 2013-06-27 | Disposition: A | Discharge status: Home / Self Care | Payer: Medicare Other | Attending: Emergency Medicine | Admitting: Emergency Medicine

## 2013-06-27 ENCOUNTER — Emergency Department (HOSPITAL_COMMUNITY): Payer: Medicare Other

## 2013-06-27 DIAGNOSIS — R109 Unspecified abdominal pain: Secondary | ICD-10-CM

## 2013-06-27 LAB — PROTIME-INR
INR: 2.05 — ABNORMAL HIGH (ref 0.00–1.49)
Prothrombin Time: 22.5 seconds — ABNORMAL HIGH (ref 11.6–15.2)

## 2013-06-27 MED ORDER — HYDROMORPHONE HCL PF 1 MG/ML IJ SOLN
1.0000 mg | Freq: Once | INTRAMUSCULAR | Status: AC
  Administered 2013-06-27: 1 mg via INTRAVENOUS
  Filled 2013-06-27: qty 1

## 2013-06-27 MED ORDER — HYDROMORPHONE HCL PF 1 MG/ML IJ SOLN
1.0000 mg | Freq: Once | INTRAMUSCULAR | Status: AC
  Administered 2013-06-27: 1 mg via INTRAVENOUS

## 2013-06-27 MED ORDER — HYDROMORPHONE HCL PF 1 MG/ML IJ SOLN
INTRAMUSCULAR | Status: AC
  Filled 2013-06-27: qty 1

## 2013-06-27 MED ORDER — ONDANSETRON HCL 4 MG/2ML IJ SOLN
4.0000 mg | Freq: Once | INTRAMUSCULAR | Status: AC
  Administered 2013-06-27: 4 mg via INTRAVENOUS
  Filled 2013-06-27: qty 2

## 2013-06-27 MED ORDER — POLYETHYLENE GLYCOL 3350 17 GM/SCOOP PO POWD: 17.0000 g | Freq: Three times a day (TID) | ORAL | Status: DC

## 2013-06-27 MED ORDER — SODIUM CHLORIDE 0.9 % IV BOLUS (SEPSIS)
1000.0000 mL | Freq: Once | INTRAVENOUS | Status: AC
  Administered 2013-06-27: 1000 mL via INTRAVENOUS

## 2013-06-27 MED ORDER — IOHEXOL 300 MG/ML  SOLN
50.0000 mL | Freq: Once | INTRAMUSCULAR | Status: AC | PRN
  Administered 2013-06-27: 50 mL via ORAL

## 2013-06-27 NOTE — ED Notes (Signed)
The pt just had  Dilaudid iv and he is already calling for more pain med.  He was dosing just after the dilaudid was  Going in

## 2013-06-27 NOTE — ED Notes (Signed)
Pt c/o abd pain since this am. Iv attempted he only wants the iv started in his lt a-c no vein there.  Iv team called

## 2013-06-27 NOTE — ED Provider Notes (Signed)
Medical screening examination/treatment/procedure(s) were performed by non-physician practitioner and as supervising physician I was immediately available for consultation/collaboration.   Stephan Draughn L Lamyia Cdebaca, MD 06/27/13 0644 

## 2013-06-27 NOTE — ED Notes (Signed)
More oral  Contrast given to the pt

## 2013-06-27 NOTE — ED Notes (Signed)
The pt keeps  Calling and asking for pain med.  Pa nitified he does not want to give the pt anymore meds.  Info given to the pt.  The pt then replied that he was accustomed to  Taking oxycodone and oxycontin

## 2013-06-27 NOTE — ED Provider Notes (Signed)
CSN: [id number]     Arrival date & time 06/26/13  2118 History     First MD Initiated Contact with Patient 06/27/13 0045     Chief Complaint  Patient presents with  . Abdominal Pain   HPI  History provided by the patient. The patient is a 52 year old male with history of multiple sclerosis, previous PEs, cholecystectomy who presents with complaints of right sided and lower abdominal pains. Patient states he has occasionally had some pains and discomforts following his cholecystectomy over a year ago. This is also sometimes associated with abdominal distentions. He has discussed this in the past with his primary care providers who have attributed to his eating habits. Today however patient states he has had sharp worsening of pains to the right lower quadrant for the past 2 days. He denies any associated diarrhea or constipation. There is some associated pressure and discomfort with urinating. He denies any hematuria or flank pain. No previous history of kidney stones. He denies any urinary retention. No associated fever. Patient does take chronic pain medications and has been taking knees which slightly helped with symptoms. No other aggravating or alleviating factors. No other associated symptoms.   Past Medical History  Diagnosis Date  . Gallstones 02/17/2009  . Multiple sclerosis   . Pulmonary embolism     recurrent  . Chronic pain syndrome 01/25/2008  . Insomnia 11/06/2008  . DVT (deep vein thrombosis) in pregnancy 02/19/2009  . Depression   . HTN (hypertension)   . Anxiety    Past Surgical History  Procedure Laterality Date  . Cholecystectomy  02/20/2009   Family History  Problem Relation Age of Onset  . Cancer Father   . Diabetes Mother    History  Substance Use Topics  . Smoking status: Current Every Day Smoker  . Smokeless tobacco: Not on file  . Alcohol Use: Yes    Review of Systems  Constitutional: Positive for chills. Negative for fever.  Gastrointestinal:  Positive for abdominal pain. Negative for nausea, vomiting, diarrhea and constipation.  Genitourinary: Positive for dysuria. Negative for hematuria, flank pain and genital sores.  All other systems reviewed and are negative.    Allergies  Review of patient's allergies indicates no known allergies.  Home Medications   Current Outpatient Rx  Name  Route  Sig  Dispense  Refill  . albuterol (PROVENTIL HFA;VENTOLIN HFA) 108 (90 BASE) MCG/ACT inhaler   Inhalation   Inhale 2 puffs into the lungs 4 (four) times daily.         . baclofen (LIORESAL) 20 MG tablet   Oral   Take 20 mg by mouth 4 (four) times daily.          [name] dalfampridine (AMPYRA) 10 MG TB12   Oral   Take 1 tablet (10 mg total) by mouth 2 (two) times daily.   180 tablet   0   . gabapentin (NEURONTIN) 600 MG tablet   Oral   Take 600 mg by mouth 3 (three) times daily.           . Interferon Beta-1b (BETASERON [place])   Subcutaneous   Inject 3 mg into the skin daily.         [name] lisinopril-hydrochlorothiazide (PRINZIDE,ZESTORETIC) 10-12.5 MG per tablet   Oral   Take 1 tablet by mouth daily.         . Oxcarbazepine (TRILEPTAL) 300 MG tablet   Oral   Take 300 mg by mouth 3 (three) times daily.         [name]  oxyCODONE (OXYCONTIN) 20 MG 12 hr tablet   Oral   Take 20 mg by mouth every 12 (twelve) hours as needed for pain.          [name] oxyCODONE-acetaminophen (PERCOCET) 5-325 MG per tablet   Oral   Take 1 tablet by mouth 4 (four) times daily as needed for pain.          [name] sertraline (ZOLOFT) 50 MG tablet   Oral   Take 50 mg by mouth daily.           . SUMAtriptan (IMITREX) 50 MG tablet   Oral   Take 50 mg by mouth every 2 (two) hours as needed for migraine (max of 2 tablets in 24 hours).         . verapamil (CALAN) 80 MG tablet   Oral   Take 80 mg by mouth 2 (two) times daily.         [name] warfarin (COUMADIN) 5 MG tablet   Oral   Take 5 mg by mouth daily.          . OXYGEN-HELIUM IN    Inhalation   Inhale 2 L into the lungs.           BP 143/90  Pulse 75  Temp(Src) 98.3 F (36.8 C) (Oral)  Resp 19  SpO2 99% Physical Exam  Nursing note and vitals reviewed. Constitutional: He is oriented to person, place, and time. He appears well-developed and well-nourished. No distress.  HENT:  Head: Normocephalic.  Cardiovascular: Normal rate and regular rhythm.   Pulmonary/Chest: Effort normal and breath sounds normal.  Abdominal: Soft. There is tenderness in the right lower quadrant. There is no rebound, no guarding, no CVA tenderness, no tenderness at McBurney's point and negative Murphy's sign.  There is some small subcutaneous firmness in nodularity within the right lower abdominal wall. This area is tender. This is not convincingly a hernia however may be possible. Exam is somewhat limited due to obesity. No peritoneal signs  Neurological: He is alert and oriented to person, place, and time.  Baseline weakness in lower extremities  Skin: Skin is warm.  Psychiatric: He has a normal mood and affect. His behavior is normal.    ED Course   Procedures   Results for orders placed during the hospital encounter of 06/27/13  CBC WITH DIFFERENTIAL      Result Value Range   WBC 5.5  4.0 - 10.5 K/uL   RBC 5.71  4.22 - 5.81 MIL/uL   Hemoglobin 15.7  13.0 - 17.0 g/dL   HCT 16.1  09.6 - 04.5 %   MCV 81.6  78.0 - 100.0 fL   MCH 27.5  26.0 - 34.0 pg   MCHC 33.7  30.0 - 36.0 g/dL   RDW 40.9  81.1 - 91.4 %   Platelets 246  150 - 400 K/uL   Neutrophils Relative % 37 (*) 43 - 77 %   Neutro Abs 2.1  1.7 - 7.7 K/uL   Lymphocytes Relative 49 (*) 12 - 46 %   Lymphs Abs 2.7  0.7 - 4.0 K/uL   Monocytes Relative 11  3 - 12 %   Monocytes Absolute 0.6  0.1 - 1.0 K/uL   Eosinophils Relative 2  0 - 5 %   Eosinophils Absolute 0.1  0.0 - 0.7 K/uL   Basophils Relative 0  0 - 1 %   Basophils Absolute 0.0  0.0 - 0.1 K/uL  COMPREHENSIVE METABOLIC PANEL  Result Value Range   Sodium 134  (*) 135 - 145 mEq/L   Potassium 3.9  3.5 - 5.1 mEq/L   Chloride 97  96 - 112 mEq/L   CO2 28  19 - 32 mEq/L   Glucose, Bld 90  70 - 99 mg/dL   BUN 11  6 - 23 mg/dL   Creatinine, Ser 1.61  0.50 - 1.35 mg/dL   Calcium 9.5  8.4 - 09.6 mg/dL   Total Protein 9.0 (*) 6.0 - 8.3 g/dL   Albumin 4.0  3.5 - 5.2 g/dL   AST 37  0 - 37 U/L   ALT 44  0 - 53 U/L   Alkaline Phosphatase 87  39 - 117 U/L   Total Bilirubin 0.4  0.3 - 1.2 mg/dL   GFR calc non Af Amer >90  >90 mL/min   GFR calc Af Amer >90  >90 mL/min  LIPASE, BLOOD      Result Value Range   Lipase 36  11 - 59 U/L  PROTIME-INR      Result Value Range   Prothrombin Time 22.5 (*) 11.6 - 15.2 seconds   INR 2.05 (*) 0.00 - 1.49       Ct Abdomen Pelvis Wo Contrast  06/27/2013   *RADIOLOGY REPORT*  Clinical Data: Right lower quadrant pain, onset yesterday.  CT ABDOMEN AND PELVIS WITHOUT CONTRAST  Technique:  Multidetector CT imaging of the abdomen and pelvis was performed following the standard protocol without intravenous contrast.  Comparison: 08/02/2011  Findings: Dependent atelectasis in the lung bases.  Surgical absence of the gallbladder.  The unenhanced appearance of the liver, spleen, pancreas, kidneys, abdominal aorta, inferior vena cava, and retroperitoneal lymph nodes is unremarkable.  No renal or ureteral stone.  No pyelocaliectasis or ureterectasis. The stomach and small bowel are not abnormally distended.  No wall thickening.  Scattered stool within the colon.  No colonic distension.  No free air or free fluid in the abdomen. Calcifications in the subcutaneous fat of the abdominal wall likely represent injection granulomas.  Pelvis:  Calcification in the pelvis consistent with phleboliths. Prostate gland is normal sized. Tiny amount of gas in the bladder suggesting cystitis although catheterization or fistula can also have this appearance.  The appearance is similar to the previous study.  Stool filled rectosigmoid colon without  diverticulitis. The appendix is normal.  Normal alignment of the lumbar spine.  No destructive bone lesions appreciated.  IMPRESSION: Normal appendix.  Tiny punctate gas collection in the bladder could represent cystitis or iatrogenic air.  Surgical absence of the gallbladder.   Original Report Authenticated By: Burman Nieves, M.D.     1. Abdominal pain     MDM  Patient seen and evaluated. He appears uncomfortable but no acute distress. There is tenderness over the right abdomen.  Labs unremarkable. Will proceed with CT for further evaluation.  No concerning or emergent findings on the CT. The patient does have some ossifications within the subcutaneous area of the abdomen correlating with small palpated nodules. These may be related to past subcutaneous injections sparsely from Lovenox or other medications. I doubt these have any significance to his pain symptoms today. No appendicitis. No hernias. At this time we'll discharge home to followup with PCP.  Angus Seller, PA-C 06/27/13 [phone]

## 2013-06-27 NOTE — ED Notes (Signed)
Iv team contacted to attempt an iv start

## 2013-07-12 ENCOUNTER — Other Ambulatory Visit: Payer: Self-pay | Admitting: Neurology

## 2013-07-12 MED ORDER — ESZOPICLONE 3 MG PO TABS: 3.0000 mg | ORAL_TABLET | Freq: Every evening | ORAL | Status: DC | PRN

## 2013-07-12 NOTE — Telephone Encounter (Signed)
[facility] patient assigned to Dr Yan  

## 2013-07-13 NOTE — Telephone Encounter (Signed)
Rx signed and faxed.

## 2013-07-14 ENCOUNTER — Telehealth: Payer: Self-pay

## 2013-07-14 MED ORDER — INTERFERON BETA-1B 0.3 MG ~~LOC~~ KIT: 0.2500 mg | PACK | SUBCUTANEOUS | Status: DC

## 2013-07-14 NOTE — Telephone Encounter (Signed)
[facility] patient assigned to Dr Terrace Arabia.  He has an appt this month, but pharmacy called stating he needs a refill on Betaseron today.  I gave verbal order to auth refill.

## 2013-07-20 ENCOUNTER — Ambulatory Visit: Payer: Medicare Other | Admitting: Neurology

## 2013-07-21 ENCOUNTER — Telehealth: Payer: Self-pay | Admitting: Neurology

## 2013-07-22 NOTE — Telephone Encounter (Signed)
Spoke to patient. Requesting to have handicap shower fixed. Would like for Dr.Yan to contact [facility] MD in [place] about helping him get shower fixed. Patient was in office for visit, but had to leave because transportation dropped him off to early on 07/18/13. Advised patient scheduled to see Dr. Terrace Arabia on 08/02/13. Patient says he guess he will have to wait until then.

## 2013-08-02 ENCOUNTER — Ambulatory Visit (INDEPENDENT_AMBULATORY_CARE_PROVIDER_SITE_OTHER): Payer: Medicare Other | Admitting: Neurology

## 2013-08-02 ENCOUNTER — Encounter: Payer: Self-pay | Admitting: Neurology

## 2013-08-02 ENCOUNTER — Other Ambulatory Visit: Payer: Self-pay | Admitting: Neurology

## 2013-08-02 VITALS — BP 106/70 | HR 68 | Ht 69.0 in | Wt 202.0 lb

## 2013-08-02 DIAGNOSIS — G35 Multiple sclerosis: Secondary | ICD-10-CM

## 2013-08-02 DIAGNOSIS — Z86718 Personal history of other venous thrombosis and embolism: Secondary | ICD-10-CM

## 2013-08-02 NOTE — Progress Notes (Signed)
[facility]  PATIENT: [name] DOB: 27-Mar-1961  HISTORICAL  History of Present Illness:    [name] is a 52 year old right-handed African American male, previously patient of Dr. Sandria Manly. 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  He has been treated with Avonex, Betaseron, Tysabri, Copaxone, and Betaseron. Since the fall of 2010, he started to use a motorized wheelchair inside and outside of his house but  can stand and walk with a walker or crutches  for short distances.   He also has history of sudden visual loss.  MRI study of the brain 07/17/09 showing multiple nonspecific subcortical and juxtacortical white matter lesions consistent with the diagnosis of MS or small vessel disease and no change versus a previous study 01/24/09.  MRA of the neck and brain 07/17/09 were normal.   He uses a motorized wheelchair. He enjoys lifting weights.  He is on coumadin for phlebitis.   He saw Dr. Brandon Melnick and started Tysabri 09/2010 for 3 months but discontinued 11/2010 because of poor venous access. Dr. Leotis Shames gave him information regarding [facility]. The patient had chickenpox at age 58, he was clinically stable,  He was kept on Betaseron since 11/2010.    Previously he had had a screening colonoscopy by Dr. Leone Payor 11/2010 with findings of a small polyp.   He had numbness from his left wrist towards the shoulder.His symptoms were in the arm and not the hand. He  had similar symptoms in his right arm. 4.2013 he noted recurrence in his right arm lasting approximately minutes and  his left arm. His symptoms will each last about 20 minutes.   He now comes in with blurred vision  in both eyes present all the time without double vision.   MRI cervical spine (with and without contrast) demonstrating in May 2013 showed multiple chronic demyelinating plaques from C2 to T1.  No acute plaques are seen. Disc  bulging with foraminal stenosis from C3-4 to C5-6 as above.   MRI brain showed few scattered subcortical and juxtacortical foci of T2 hyperintensities, which may be due to chronic demyelinating disease or chronic small vessel ischemic disease. In comparison to the prior MRI from 10/17/11 there is no interval change  UPDATE Aug 02 2013: He is continuing taking Betaseron, there was no clinical worsening, he came to clinic by her transportation then today, he lives with his mother and brother, he ambulate at home with walker, no problem bladder incontinence, he can transfer himself in out of wheelchair,   Review of Systems  Out of a complete 14 system review, the patient complains of only the following symptoms, and all other reviewed systems are negative.   Weight gain, chest pain, blurred vision, memory loss, headaches, sleepiness   PAST SURGICAL HISTORY: Past Surgical History  Procedure Laterality Date  . Cholecystectomy  02/20/2009    FAMILY HISTORY: Family History  Problem Relation Age of Onset  . Cancer Father   . Diabetes Mother     SOCIAL HISTORY:  History   Social History  . Marital Status: Divorced    Spouse Name: N/A    Number of Children: 3  . Years of Education: 57 th   Occupational History  .      Disabled   Social History Main Topics  . Smoking status: Current Every Day Smoker -- 0.50 packs/day for 20 years    Types: Cigarettes  . Smokeless tobacco: Never Used  .  Alcohol Use: 0.0 oz/week  . Drug Use: Yes     Comment: Quit 2011  . Sexual Activity: Not on file   Other Topics Concern  . Not on file   Social History Narrative   Patient is divorced. Patient is disabled. Because of his MS. Patient has 11 th grade education. 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    Right handed.   Caffeine- One daily.     PHYSICAL EXAM   Filed Vitals:   08/02/13 1306  BP: 106/70  Pulse: 68  Height: 5\' 9"  (1.753 m)  Weight: 202 lb  (91.627 kg)   Physical Exam  General: well-developed African American male. Patient was examined in a motorized wheelchair   Neurologic Exam  Mental Status:  alert and oriented. Stuttering of his speech. Cranial Nerves:  Frequent eye blinking. extrocular movement were full. Visual field were full on confrontational test.  Hearing was intact. He has decreased left nasolabial fold. Sternocleidomastoid and trapezius testing normal. He has slight dysarthria Motor:  5/5 in the upper extremities with clumsiness present. 2-3/5 proximally right leg  and 1/5 distally in the right leg, right knee flexion 3, knee extension 3, 3/5 proximally left leg and 3/5 distally left leg. 4  marked increased tone with stiff legs and can lift  his left knee better than his right. Moderate bilateral upper extremity spasticity, no significant weakness. Sensory:  Intact to pinprick, touch, joint position and vibration testing In the upper and lower extremities. Coordination:  Clumsiness in the arms. Slight tremor in his voice.    Gait and Station:  deferred Reflexes:  Hyperreflexia bilaterally. Plantar responses neutral.     DIAGNOSTIC DATA (LABS, IMAGING, TESTING) - I reviewed patient records, labs, notes, testing and imaging myself where available.  Lab Results  Component Value Date   WBC 5.5 06/26/2013   HGB 15.7 06/26/2013   HCT 46.6 06/26/2013   MCV 81.6 06/26/2013   PLT 246 06/26/2013      Component Value Date/Time   NA 134* 06/26/2013 2243   K 3.9 06/26/2013 2243   CL 97 06/26/2013 2243   CO2 28 06/26/2013 2243   GLUCOSE 90 06/26/2013 2243   BUN 11 06/26/2013 2243   CREATININE 0.83 06/26/2013 2243   CALCIUM 9.5 06/26/2013 2243   PROT 9.0* 06/26/2013 2243   ALBUMIN 4.0 06/26/2013 2243   AST 37 06/26/2013 2243   ALT 44 06/26/2013 2243   ALKPHOS 87 06/26/2013 2243   BILITOT 0.4 06/26/2013 2243   GFRNONAA >90 06/26/2013 2243   GFRAA >90 06/26/2013 2243   No results found for this basename: CHOL, HDL, LDLCALC,  LDLDIRECT, TRIG, CHOLHDL   Lab Results  Component Value Date   HGBA1C  Value: 6.5 (NOTE)   The ADA recommends the following therapeutic goal for glycemic   control related to Hgb A1C measurement:   Goal of Therapy:   < 7.0% Hgb A1C   Reference: American Diabetes Association: Clinical Practice   Recommendations 2008, Diabetes Care,  2008, 31:(Suppl 1).* 09/28/2008     ASSESSMENT AND PLAN  52 years old [place] American male, with past medical history of relapsing remitting multiple sclerosis, spastic quadriplegia, right worse than left, wheelchair-bound,  1. continue disease modifying medications, Betaseron. 2. repeat MRI of the brain and cervical spine with and without contrast. 3. Will return to clinic in 6 months       Levert Feinstein, M.D. Ph.D.  [facility] [address]  [place], [place] [address] [phone]

## 2013-08-05 ENCOUNTER — Other Ambulatory Visit: Payer: Self-pay | Admitting: Gastroenterology

## 2013-08-05 DIAGNOSIS — R140 Abdominal distension (gaseous): Secondary | ICD-10-CM

## 2013-08-05 DIAGNOSIS — R109 Unspecified abdominal pain: Secondary | ICD-10-CM

## 2013-08-08 ENCOUNTER — Telehealth: Payer: Self-pay | Admitting: Neurology

## 2013-08-09 ENCOUNTER — Telehealth: Payer: Self-pay | Admitting: Neurology

## 2013-08-09 ENCOUNTER — Ambulatory Visit
Admission: RE | Admit: 2013-08-09 | Discharge: 2013-08-09 | Disposition: A | Discharge status: Home / Self Care | Payer: Medicare Other | Source: Ambulatory Visit | Attending: Gastroenterology | Admitting: Gastroenterology

## 2013-08-09 DIAGNOSIS — R140 Abdominal distension (gaseous): Secondary | ICD-10-CM

## 2013-08-09 DIAGNOSIS — R109 Unspecified abdominal pain: Secondary | ICD-10-CM

## 2013-08-09 MED ORDER — IOHEXOL 300 MG/ML  SOLN
125.0000 mL | Freq: Once | INTRAMUSCULAR | Status: AC | PRN
  Administered 2013-08-09: 125 mL via INTRAVENOUS

## 2013-08-09 MED ORDER — GABAPENTIN 300 MG PO CAPS: 300.0000 mg | ORAL_CAPSULE | Freq: Three times a day (TID) | ORAL | Status: DC

## 2013-08-09 NOTE — Telephone Encounter (Signed)
I have called him, he complains of left arm numbness, tingling, pain.  Chart reviewed, he has Multiple sclerosis, paraplegia.  I will call in Neurontin 300mg  tid.

## 2013-08-09 NOTE — Telephone Encounter (Signed)
Nerve on left arm acting up really bad burning and stinging. He was on medication under Dr. Sandria Manly that helped significantly with this. He can not remember the name of the medication. It was not Zoloft, which he is taking now. He said he had been prescribed this medication over two years ago. He was on Lyrica for neuropathy pain in his legs back then. He is asking that this be addressed right away, as he called yesterday and today.   I let patient know that I have already printed out the medications he was on under Dr. Sandria Manly. I will forward this message to Dr. Terrace Arabia. I will also print it, attack the medication list and leave it on her desk. Either Dr. Terrace Arabia or I will get back to patient today with a plan.

## 2013-08-13 ENCOUNTER — Other Ambulatory Visit: Payer: Self-pay | Admitting: Neurology

## 2013-08-23 IMAGING — MR MR CERVICAL SPINE WO/W CM
4 of 8 series · 20 of 48 positions shown · non-contrast
Comparison: none

[Series 2: T1 · sagittal · 3.0mm · 0.39mm/px · 4 of 12 slices shown (1 of 2)]
[im 1/12]
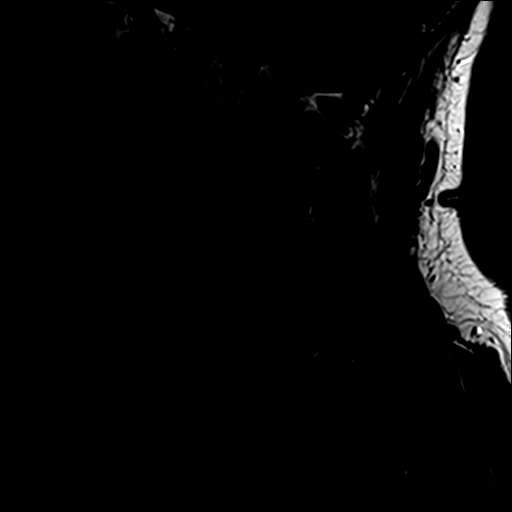
[im 4/12]
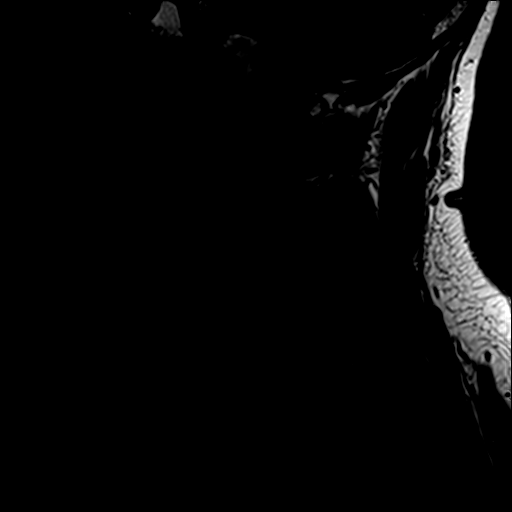
[im 8/12]
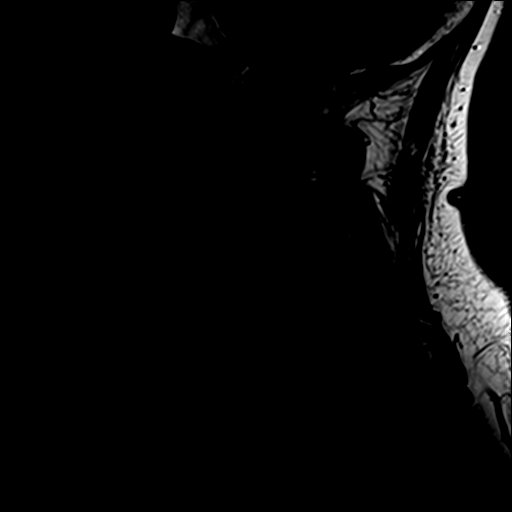
[im 12/12]
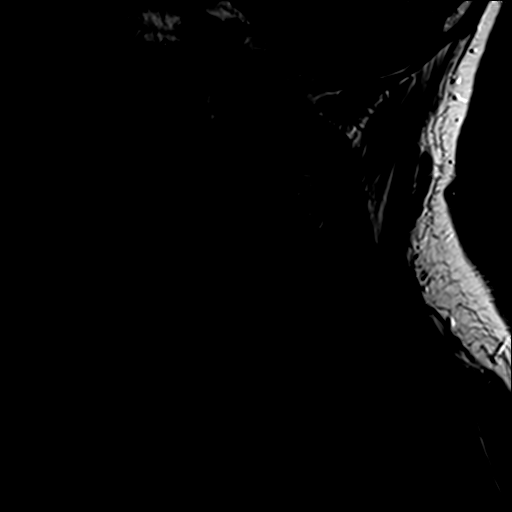

[Series 3: T2 · sagittal · 3.0mm · 0.39mm/px · 4 of 12 slices shown (1 of 2)]
[im 1/12]
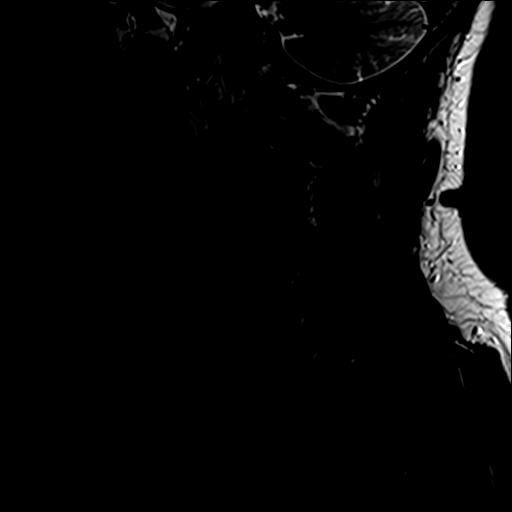
[im 4/12]
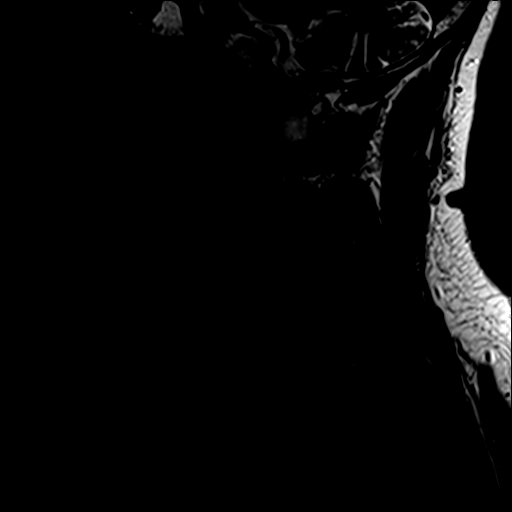
[im 8/12]
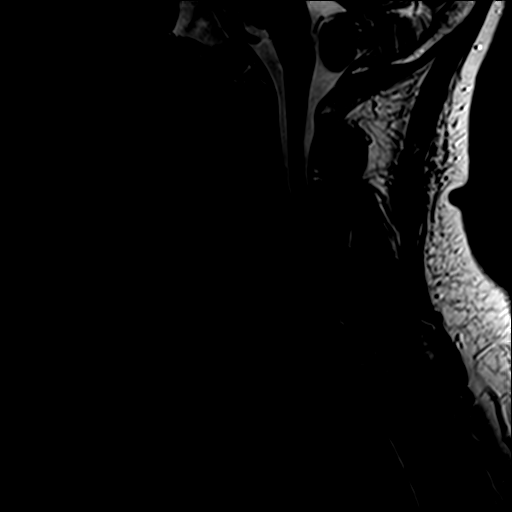
[im 12/12]
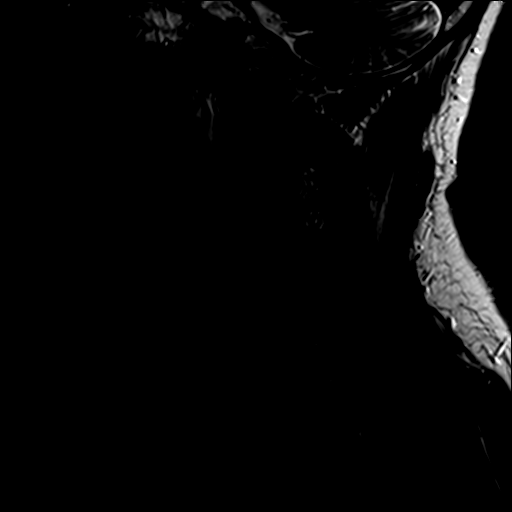

[Series 6: T2 · axial · 3.0mm · 0.33mm/px · z∈[-203,-109]mm · 8 of 26 slices shown (2 of 2)]
[im 1/26]
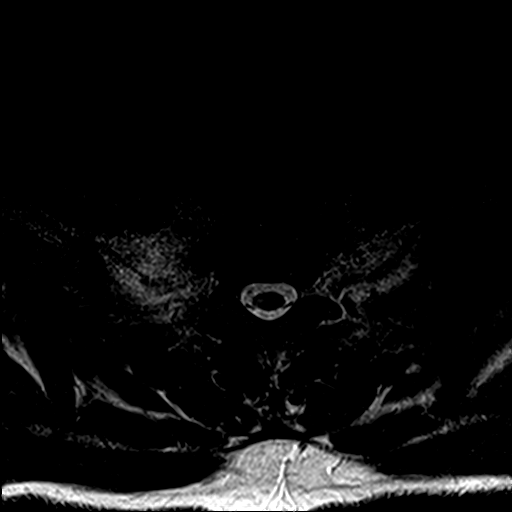
[im 4/26]
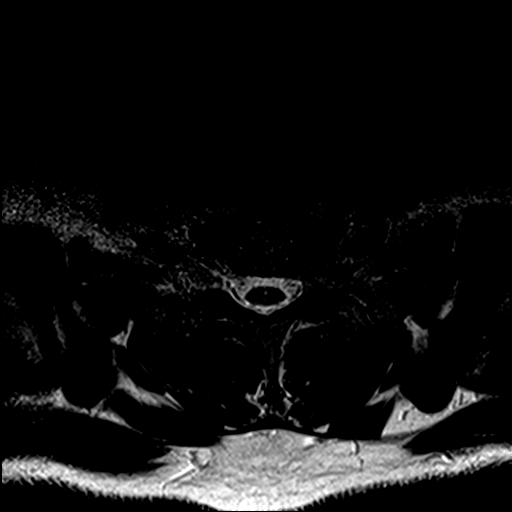
[im 8/26]
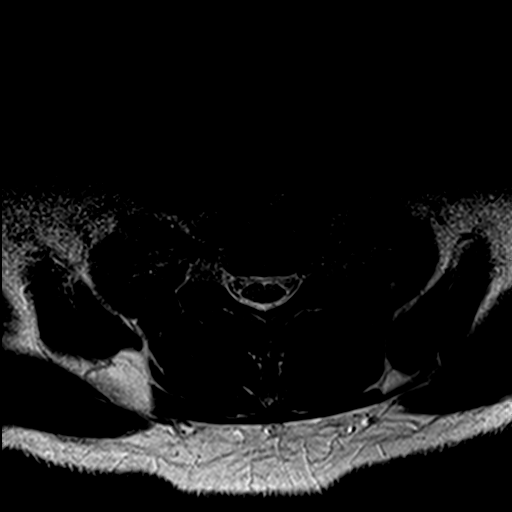
[im 11/26]
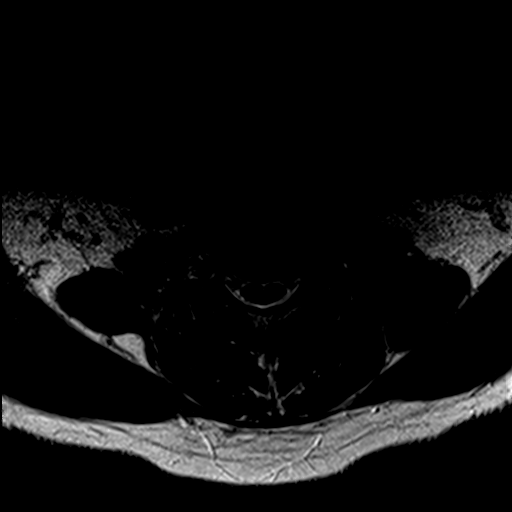
[im 15/26]
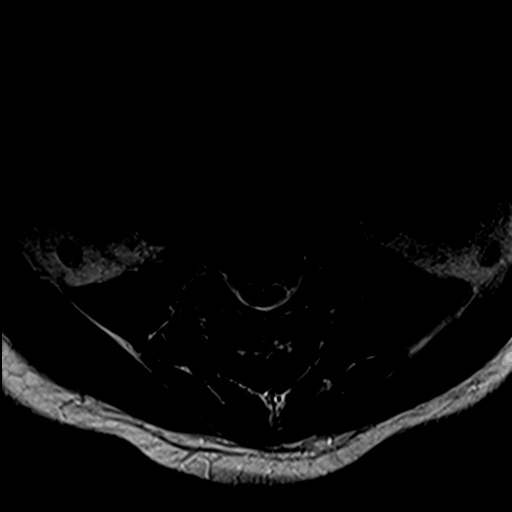
[im 18/26]
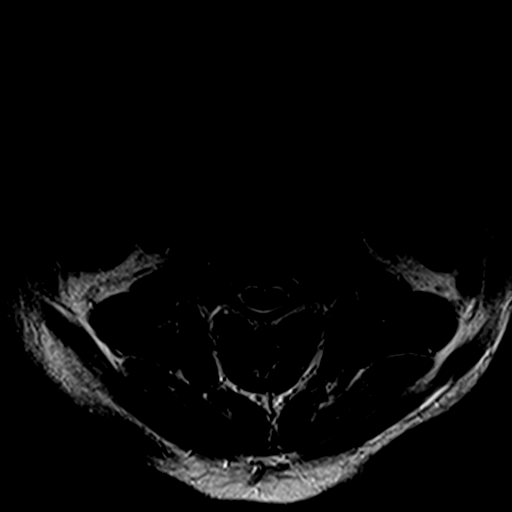
[im 22/26]
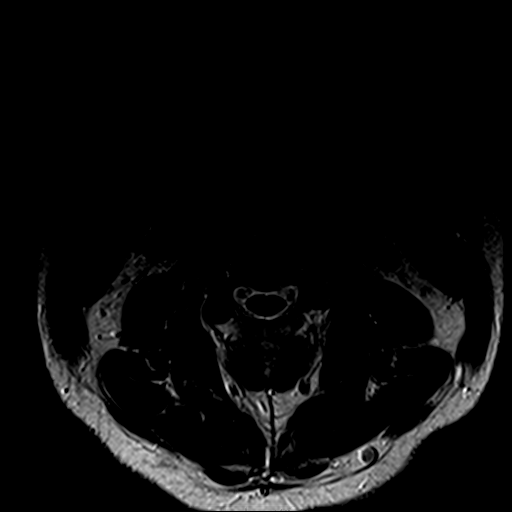
[im 26/26]
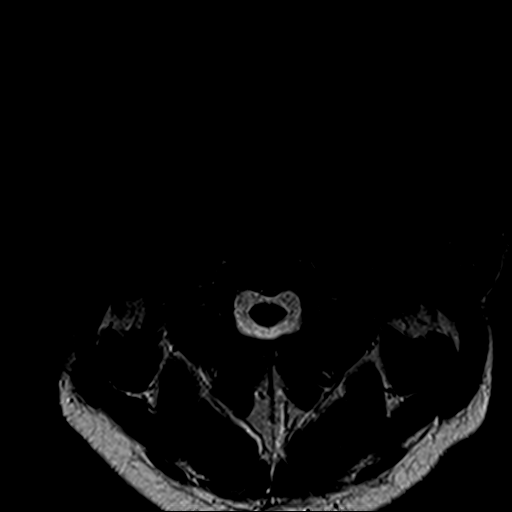

[Series 7: T1 · axial · 3.0mm · 0.33mm/px · z∈[-203,-124]mm · 4 of 26 slices shown (2 of 2)]
[im 1/26]
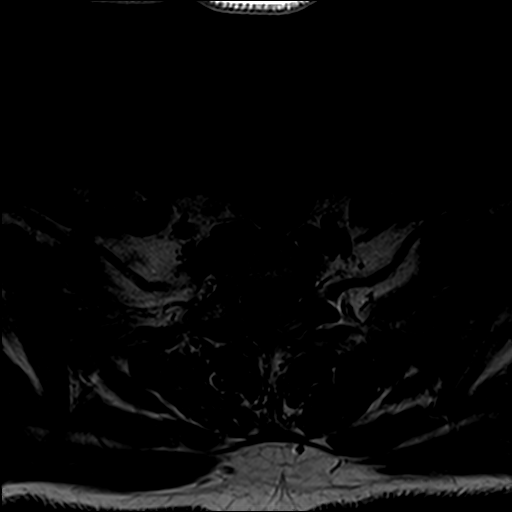
[im 4/26]
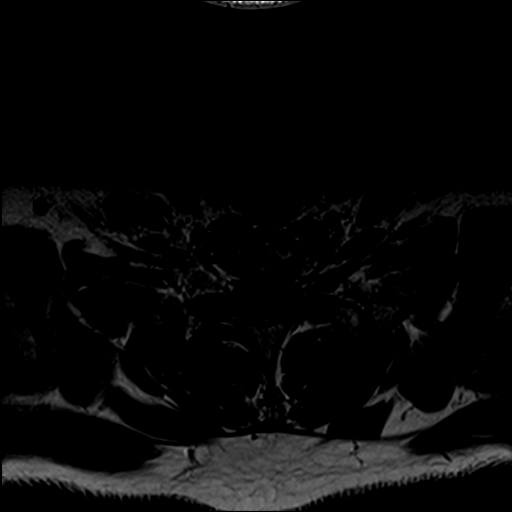
[im 15/26]
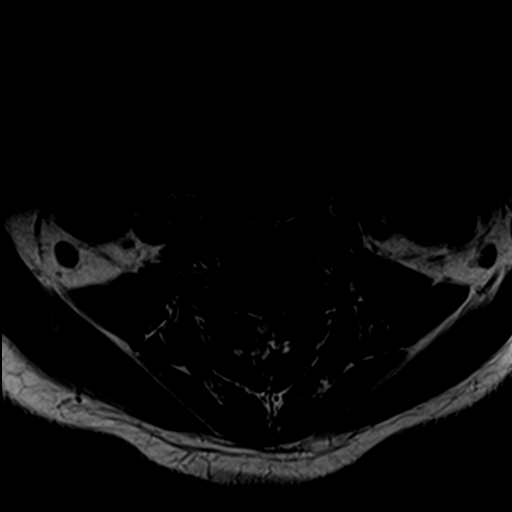
[im 22/26]
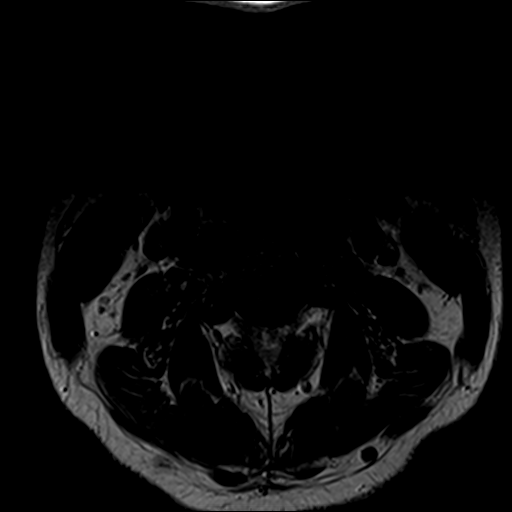

[20 of 48 positions shown; findings below may reference images not displayed]

This examination was performed at [HOSPITAL] at [HOSPITAL]. The interpretation will be provided by [REDACTED]

## 2013-08-23 IMAGING — MR MR HEAD WO/W CM
9 of 11 series · 31 of 48 positions shown · non-contrast
Comparison: none

[Series 2: T1 · sagittal · 5.0mm · 0.45mm/px · 2 of 20 slices shown]
[im 1/20]
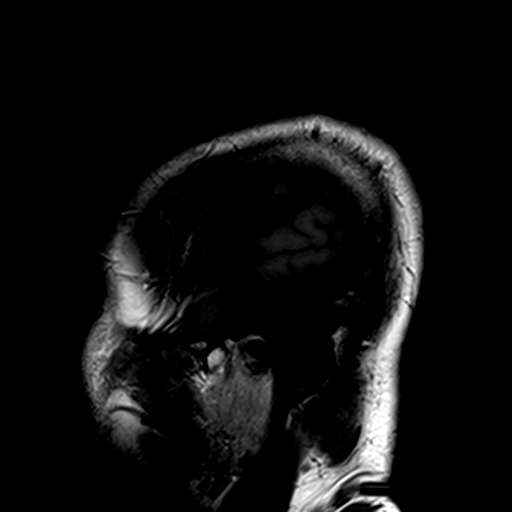
[im 20/20]
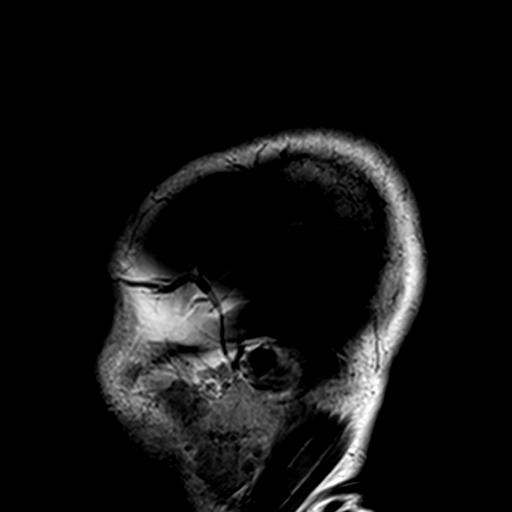

[Series 3: DWI · axial · 5.0mm · 1.80mm/px · z∈[-65,+71]mm · 5 of 44 slices shown (1 of 2)]
[im 1/44]
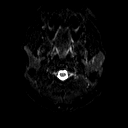
[im 11/44]
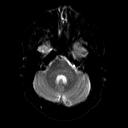
[im 22/44]
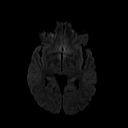
[im 33/44]
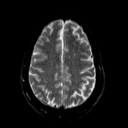
[im 44/44]
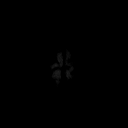

[Series 4: DWI · axial · 5.0mm · 1.80mm/px · z∈[-65,+71]mm · 3 of 22 slices shown (2 of 2)]
[im 1/22]
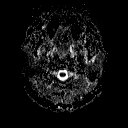
[im 11/22]
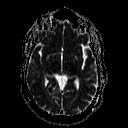
[im 22/22]
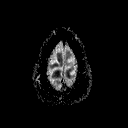

[Series 5: T2 · axial · 5.0mm · 0.45mm/px · z∈[-62,+68]mm · 3 of 21 slices shown (1 of 2)]
[im 1/21]
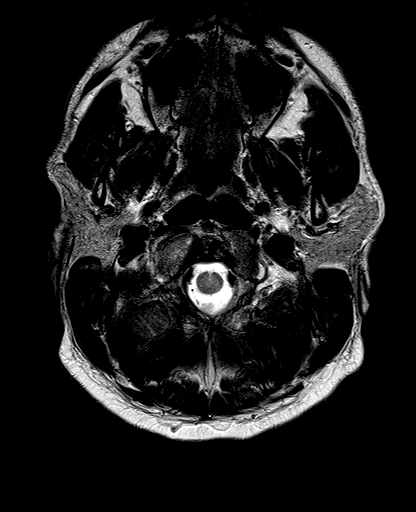
[im 11/21]
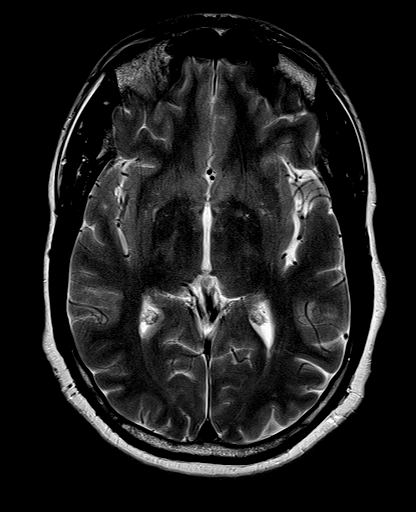
[im 21/21]
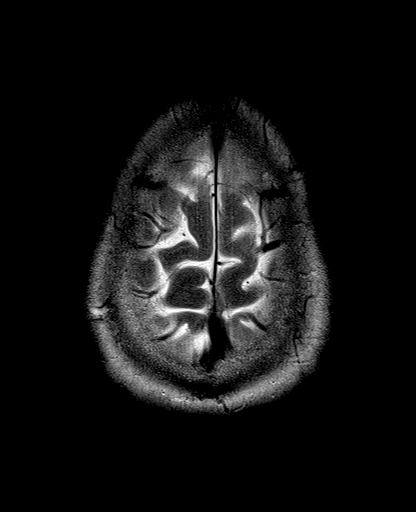

[Series 6: FLAIR · axial · 5.0mm · 0.45mm/px · z∈[-62,+68]mm · 3 of 21 slices shown (1 of 2)]
[im 1/21]
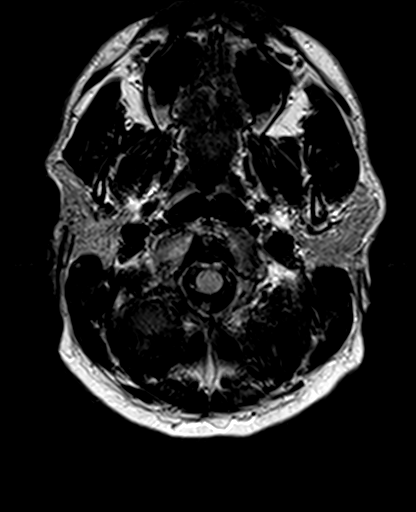
[im 11/21]
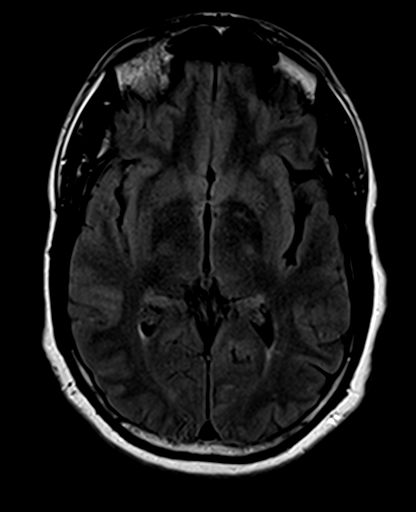
[im 21/21]
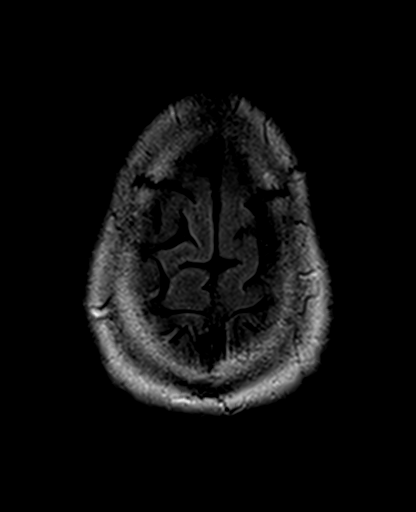

[Series 7: mpr tra · axial · 2.0mm · 0.45mm/px · z∈[-76,+82]mm · 8 of 80 slices shown]
[im 1/80]
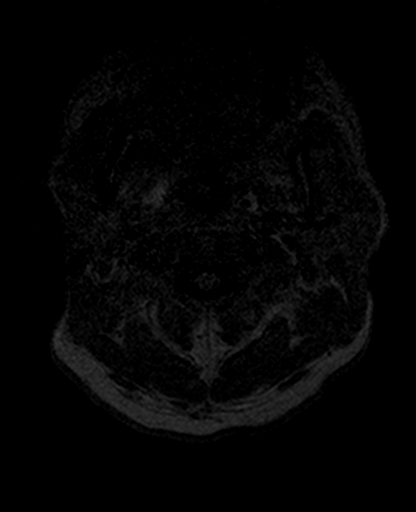
[im 9/80]
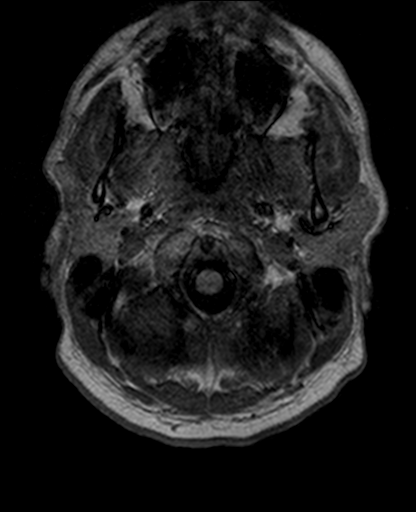
[im 27/80]
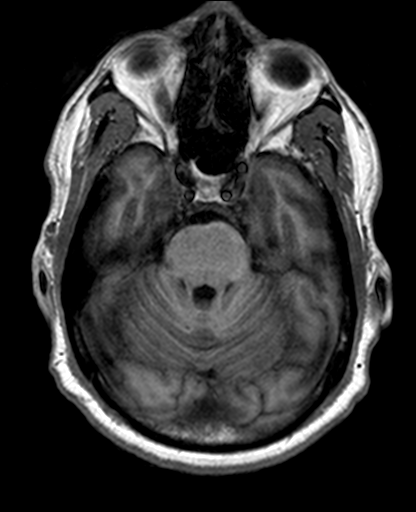
[im 36/80]
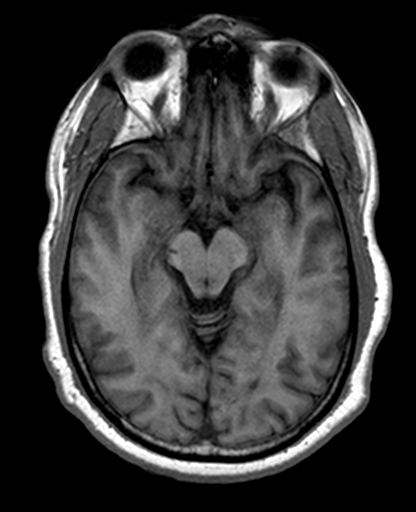
[im 44/80]
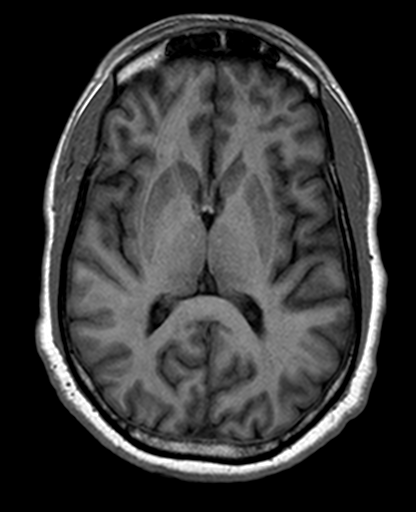
[im 53/80]
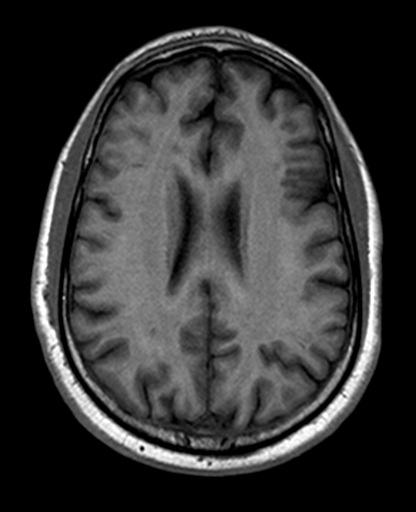
[im 71/80]
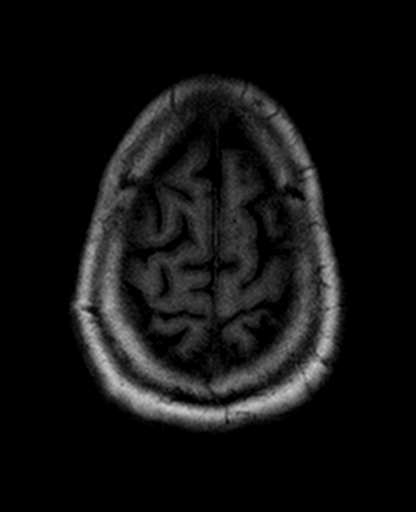
[im 80/80]
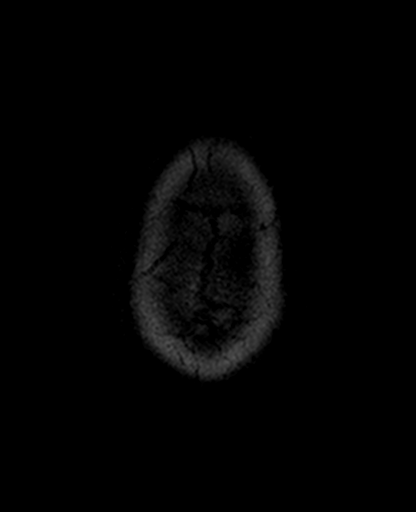

[Series 8: axial grad (blood) · axial · 5.0mm · 0.47mm/px · 1 of 21 slices shown]
[im 1/21]
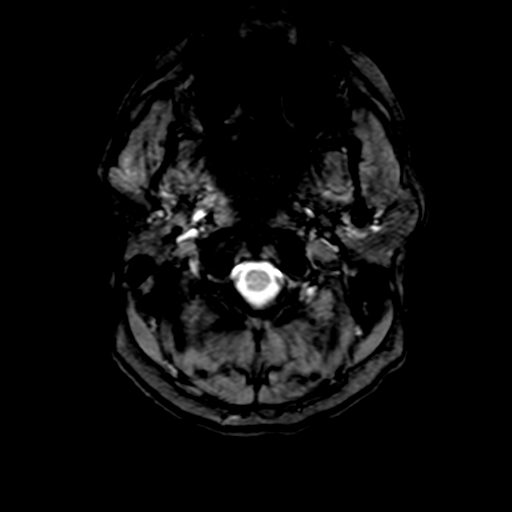

[Series 9: T2 · coronal · 5.0mm · 0.45mm/px · 3 of 22 slices shown (2 of 2)]
[im 1/22]
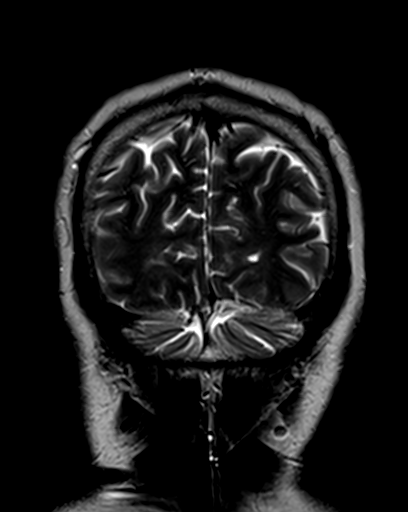
[im 11/22]
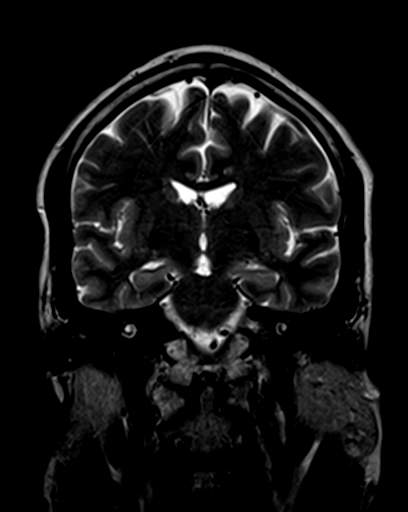
[im 22/22]
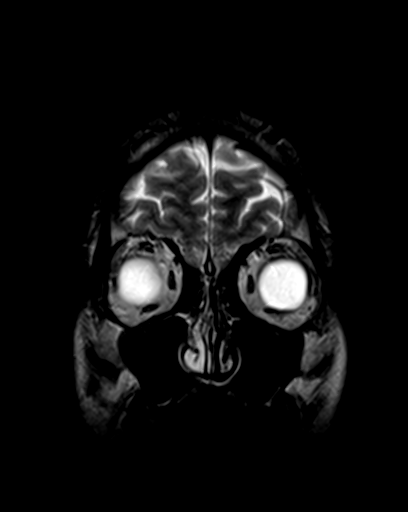

[Series 10: FLAIR · sagittal · 5.0mm · 0.45mm/px · 3 of 20 slices shown (2 of 2)]
[im 1/20]
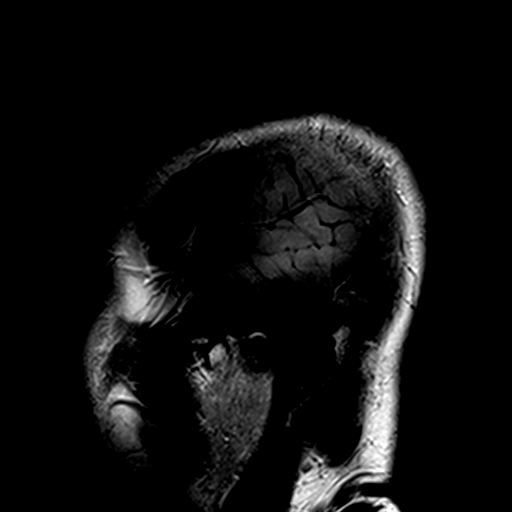
[im 10/20]
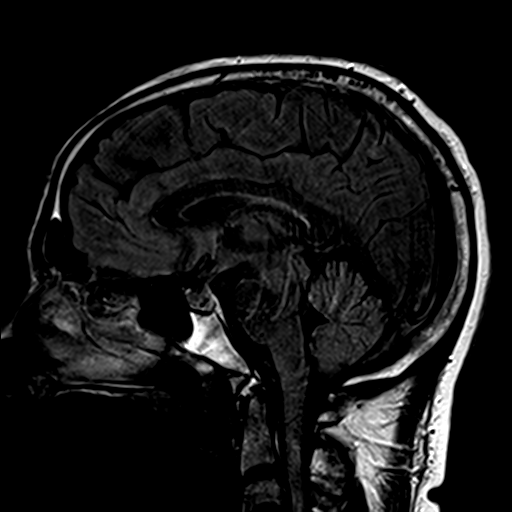
[im 20/20]
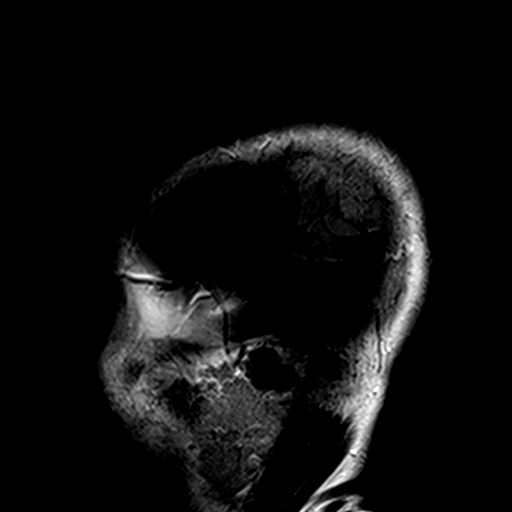

[31 of 48 positions shown; findings below may reference images not displayed]

This examination was performed at [HOSPITAL] at [HOSPITAL]. The interpretation will be provided by [REDACTED]

## 2013-09-19 ENCOUNTER — Telehealth: Payer: Self-pay | Admitting: *Deleted

## 2013-09-19 NOTE — Telephone Encounter (Signed)
Patient called into day requesting a referral, for physical therapy at [facility]. Patient would like to have rehab on his foot drop.

## 2013-09-20 ENCOUNTER — Other Ambulatory Visit: Payer: Self-pay | Admitting: Neurology

## 2013-09-21 ENCOUNTER — Other Ambulatory Visit: Payer: Self-pay

## 2013-09-21 ENCOUNTER — Telehealth: Payer: Self-pay | Admitting: Neurology

## 2013-09-21 MED ORDER — DALFAMPRIDINE ER 10 MG PO TB12: 10.0000 mg | ORAL_TABLET | Freq: Two times a day (BID) | ORAL | Status: DC

## 2013-09-21 NOTE — Telephone Encounter (Signed)
ok 

## 2013-09-21 NOTE — Telephone Encounter (Signed)
I called [facility] and completed call in refill for [name] with  [name], Pharmacist.

## 2013-09-23 ENCOUNTER — Other Ambulatory Visit: Payer: Self-pay | Admitting: *Deleted

## 2013-09-23 DIAGNOSIS — G35 Multiple sclerosis: Secondary | ICD-10-CM

## 2013-09-23 NOTE — Telephone Encounter (Signed)
Both matter has been addressed

## 2013-09-26 ENCOUNTER — Ambulatory Visit: Payer: Medicare Other | Admitting: Physical Therapy

## 2013-09-28 ENCOUNTER — Ambulatory Visit: Payer: Medicare Other | Admitting: Rehabilitative and Restorative Service Providers"

## 2013-09-30 ENCOUNTER — Ambulatory Visit: Payer: Medicare Other | Attending: Neurology | Admitting: Physical Therapy

## 2013-09-30 DIAGNOSIS — IMO0001 Reserved for inherently not codable concepts without codable children: Secondary | ICD-10-CM | POA: Insufficient documentation

## 2013-09-30 DIAGNOSIS — M6281 Muscle weakness (generalized): Secondary | ICD-10-CM | POA: Insufficient documentation

## 2013-09-30 DIAGNOSIS — R269 Unspecified abnormalities of gait and mobility: Secondary | ICD-10-CM | POA: Insufficient documentation

## 2013-09-30 DIAGNOSIS — R262 Difficulty in walking, not elsewhere classified: Secondary | ICD-10-CM | POA: Insufficient documentation

## 2013-10-04 ENCOUNTER — Ambulatory Visit: Payer: Medicare Other | Admitting: Rehabilitative and Restorative Service Providers"

## 2013-10-10 ENCOUNTER — Ambulatory Visit: Payer: Medicare Other | Admitting: Physical Therapy

## 2013-10-12 ENCOUNTER — Ambulatory Visit: Payer: Medicare Other | Admitting: Physical Therapy

## 2013-10-14 ENCOUNTER — Ambulatory Visit: Payer: Medicare Other | Attending: Neurology | Admitting: Physical Therapy

## 2013-10-14 DIAGNOSIS — R262 Difficulty in walking, not elsewhere classified: Secondary | ICD-10-CM | POA: Insufficient documentation

## 2013-10-14 DIAGNOSIS — R269 Unspecified abnormalities of gait and mobility: Secondary | ICD-10-CM | POA: Insufficient documentation

## 2013-10-14 DIAGNOSIS — IMO0001 Reserved for inherently not codable concepts without codable children: Secondary | ICD-10-CM | POA: Insufficient documentation

## 2013-10-14 DIAGNOSIS — M6281 Muscle weakness (generalized): Secondary | ICD-10-CM | POA: Insufficient documentation

## 2013-10-17 ENCOUNTER — Ambulatory Visit: Payer: Medicare Other | Admitting: Physical Therapy

## 2013-10-18 ENCOUNTER — Ambulatory Visit: Payer: Medicare Other | Admitting: Physical Therapy

## 2013-10-24 ENCOUNTER — Ambulatory Visit: Payer: Medicare Other | Admitting: Physical Therapy

## 2013-10-26 ENCOUNTER — Ambulatory Visit: Payer: Medicare Other | Admitting: Physical Therapy

## 2013-10-28 ENCOUNTER — Ambulatory Visit: Payer: Medicare Other | Admitting: Physical Therapy

## 2013-10-31 ENCOUNTER — Ambulatory Visit: Payer: Medicare Other | Admitting: Physical Therapy

## 2013-11-01 ENCOUNTER — Ambulatory Visit (HOSPITAL_COMMUNITY)
Admission: RE | Admit: 2013-11-01 | Discharge: 2013-11-01 | Disposition: A | Discharge status: Home / Self Care | Payer: Medicare Other | Source: Ambulatory Visit | Attending: Internal Medicine | Admitting: Internal Medicine

## 2013-11-01 ENCOUNTER — Other Ambulatory Visit (HOSPITAL_COMMUNITY): Payer: Self-pay | Admitting: Internal Medicine

## 2013-11-01 DIAGNOSIS — I2699 Other pulmonary embolism without acute cor pulmonale: Secondary | ICD-10-CM

## 2013-11-01 DIAGNOSIS — Z86711 Personal history of pulmonary embolism: Secondary | ICD-10-CM | POA: Insufficient documentation

## 2013-11-01 DIAGNOSIS — R5381 Other malaise: Secondary | ICD-10-CM | POA: Insufficient documentation

## 2013-12-05 ENCOUNTER — Other Ambulatory Visit: Payer: Self-pay | Admitting: Psychiatry

## 2013-12-05 DIAGNOSIS — G35 Multiple sclerosis: Secondary | ICD-10-CM

## 2013-12-14 ENCOUNTER — Inpatient Hospital Stay: Admission: RE | Admit: 2013-12-14 | Payer: Medicare Other | Source: Ambulatory Visit

## 2013-12-14 ENCOUNTER — Ambulatory Visit
Admission: RE | Admit: 2013-12-14 | Discharge: 2013-12-14 | Disposition: A | Discharge status: Home / Self Care | Payer: Medicare Other | Source: Ambulatory Visit | Attending: Psychiatry | Admitting: Psychiatry

## 2013-12-14 DIAGNOSIS — G35 Multiple sclerosis: Secondary | ICD-10-CM

## 2013-12-14 MED ORDER — GADOBENATE DIMEGLUMINE 529 MG/ML IV SOLN
19.0000 mL | Freq: Once | INTRAVENOUS | Status: AC | PRN
  Administered 2013-12-14: 19 mL via INTRAVENOUS

## 2013-12-23 ENCOUNTER — Emergency Department (INDEPENDENT_AMBULATORY_CARE_PROVIDER_SITE_OTHER)
Admission: EM | Admit: 2013-12-23 | Discharge: 2013-12-23 | Disposition: A | Discharge status: Home / Self Care | Payer: Medicare Other | Source: Home / Self Care

## 2013-12-23 ENCOUNTER — Encounter (HOSPITAL_COMMUNITY): Payer: Self-pay | Admitting: Emergency Medicine

## 2013-12-23 DIAGNOSIS — M719 Bursopathy, unspecified: Secondary | ICD-10-CM

## 2013-12-23 DIAGNOSIS — M7550 Bursitis of unspecified shoulder: Secondary | ICD-10-CM

## 2013-12-23 DIAGNOSIS — M67919 Unspecified disorder of synovium and tendon, unspecified shoulder: Secondary | ICD-10-CM

## 2013-12-23 MED ORDER — DICLOFENAC SODIUM 1 % TD GEL: 1.0000 "application " | Freq: Four times a day (QID) | TRANSDERMAL | Status: DC

## 2013-12-23 MED ORDER — TRAMADOL HCL 50 MG PO TABS: 50.0000 mg | ORAL_TABLET | Freq: Four times a day (QID) | ORAL | Status: DC | PRN

## 2013-12-23 NOTE — Discharge Instructions (Signed)
Bursitis Bursitis is a swelling and soreness (inflammation) of a fluid-filled sac (bursa) that overlies and protects a joint. It can be caused by injury, overuse of the joint, arthritis or infection. The joints most likely to be affected are the elbows, shoulders, hips and knees. HOME CARE INSTRUCTIONS   .  Rest the injured joint as much as possible, but continue to put the joint through a full range of motion, 4 times per day. (The shoulder joint especially becomes rapidly "frozen" if not used.) When the pain lessens, begin normal slow movements and usual activities.  Only take over-the-counter or prescription medicines for pain, discomfort or fever as directed by your caregiver.  Your caregiver may recommend draining the bursa and injecting medicine into the bursa. This may help the healing process.  Follow all instructions for follow-up with your caregiver. This includes any orthopedic referrals, physical therapy and rehabilitation. Any delay in obtaining necessary care could result in a delay or failure of the bursitis to heal and chronic pain. SEEK IMMEDIATE MEDICAL CARE IF:   Your pain increases even during treatment.  You develop an oral temperature above 102 F (38.9 C) and have heat and inflammation over the involved bursa. MAKE SURE YOU:   Understand these instructions.  Will watch your condition.  Will get help right away if you are not doing well or get worse. Document Released: 10/24/2000 Document Revised: 01/19/2012 Document Reviewed: 09/28/2009 [facility] Patient Information 2014 [place], [place].

## 2013-12-23 NOTE — ED Provider Notes (Signed)
CSN: [id number]     Arrival date & time 12/23/13  1323 History   First MD Initiated Contact with Patient 12/23/13 1445     No chief complaint on file.    (Consider location/radiation/quality/duration/timing/severity/associated sxs/prior Treatment) HPI Comments: 53 year old male complaining of right shoulder pain for one to 2 weeks. He states there is a knot in the shoulder. The pain radiates down the upper arm to the forearm. Denies any known trauma or other event that may have caused the pain. Denies repetitive use. He is primarily confined to a wheelchair due to multiple sclerosis and uses his arms for self propulsion and lifting.   Past Medical History  Diagnosis Date  . Gallstones 02/17/2009  . Multiple sclerosis   . Pulmonary embolism     recurrent  . Chronic pain syndrome 01/25/2008  . Insomnia 11/06/2008  . DVT (deep vein thrombosis) in pregnancy 02/19/2009  . Depression   . HTN (hypertension)   . Anxiety   . Other specified visual disturbances   . Pain in limb   . Nonspecific elevation of levels of transaminase or lactic acid dehydrogenase (LDH)   . Trigeminal neuralgia   . Other pulmonary embolism and infarction   . Abdominal pain, unspecified site   . Headache(784.0)   . Benign paroxysmal positional vertigo   . Other syndromes affecting cervical region   . Other B-complex deficiencies   . Memory loss   . Pain in limb   . Gait abnormality    Past Surgical History  Procedure Laterality Date  . Cholecystectomy  02/20/2009   Family History  Problem Relation Age of Onset  . Cancer Father   . Diabetes Mother    History  Substance Use Topics  . Smoking status: Current Every Day Smoker -- 0.50 packs/day for 20 years    Types: Cigarettes  . Smokeless tobacco: Never Used  . Alcohol Use: 0.0 oz/week    Review of Systems  Respiratory: Negative.   Gastrointestinal: Negative.   Genitourinary: Negative.   Musculoskeletal:       As per HPI  Skin: Negative.    Neurological: Positive for weakness. Negative for dizziness, numbness and headaches.      Allergies  Review of patient's allergies indicates no known allergies.  Home Medications   Current Outpatient Rx  Name  Route  Sig  Dispense  Refill  . albuterol (PROVENTIL HFA;VENTOLIN HFA) 108 (90 BASE) MCG/ACT inhaler   Inhalation   Inhale 2 puffs into the lungs 4 (four) times daily.         . AMPYRA 10 MG TB12      Take 1 tablet by mouth two  times daily   60 tablet   12   . baclofen (LIORESAL) 20 MG tablet   Oral   Take 20 mg by mouth 4 (four) times daily.          [name] BETASERON 0.3 MG KIT injection      INJECT 1ML SUBCUTANEOUSLY EVERY OTHER DAY   14 kit   6   . cyclobenzaprine (FLEXERIL) 10 MG tablet   Oral   Take 10 mg by mouth daily.         [name] dalfampridine (AMPYRA) 10 MG TB12   Oral   Take 1 tablet (10 mg total) by mouth 2 (two) times daily.   180 tablet   3   . Eszopiclone 3 MG TABS   Oral   Take 1 tablet (3 mg total) by mouth at bedtime as  needed. Take immediately before bedtime   30 tablet   0     Pharmacy Fax [phone]   . gabapentin (NEURONTIN) 300 MG capsule   Oral   Take 1 capsule (300 mg total) by mouth 3 (three) times daily.   90 capsule   12   . gabapentin (NEURONTIN) 600 MG tablet   Oral   Take 600 mg by mouth 3 (three) times daily.           [name] lisinopril-hydrochlorothiazide (PRINZIDE,ZESTORETIC) 10-12.5 MG per tablet   Oral   Take 1 tablet by mouth daily.         [name] omeprazole (PRILOSEC) 20 MG capsule   Oral   Take 20 mg by mouth daily.         . Oxcarbazepine (TRILEPTAL) 300 MG tablet   Oral   Take 300 mg by mouth 3 (three) times daily.         [name] oxyCODONE (OXYCONTIN) 20 MG 12 hr tablet   Oral   Take 20 mg by mouth every 12 (twelve) hours as needed for pain.          [name] oxyCODONE-acetaminophen (PERCOCET) 5-325 MG per tablet   Oral   Take 1 tablet by mouth 4 (four) times daily as needed for pain.          .  OXYGEN-HELIUM IN   Inhalation   Inhale 2 L into the lungs.          . polyethylene glycol powder (GLYCOLAX/MIRALAX) powder   Oral   Take 17 g by mouth 3 (three) times daily.   255 g   0   . sertraline (ZOLOFT) 50 MG tablet   Oral   Take 50 mg by mouth daily.           . SUMAtriptan (IMITREX) 50 MG tablet      TAKE 1 TABLET BY MOUTH AS NEEDED (MAX 2 TABS IN 24 HOURS)   15 tablet   6   . verapamil (CALAN) 80 MG tablet   Oral   Take 80 mg by mouth 2 (two) times daily.         [name] warfarin (COUMADIN) 5 MG tablet   Oral   Take 5 mg by mouth daily.          . diclofenac sodium (VOLTAREN) 1 % GEL   Topical   Apply 1 application topically 4 (four) times daily.   100 g   0   . traMADol (ULTRAM) 50 MG tablet   Oral   Take 1 tablet (50 mg total) by mouth every 6 (six) hours as needed.   15 tablet   0    BP 116/71  Pulse 68  Temp(Src) 97.6 F (36.4 C) (Oral)  Resp 18  SpO2 98% Physical Exam  Nursing note and vitals reviewed. Constitutional: He is oriented to person, place, and time. He appears well-developed and well-nourished.  HENT:  Head: Normocephalic and atraumatic.  Eyes: EOM are normal. Left eye exhibits no discharge.  Neck: Normal range of motion. Neck supple.  Pulmonary/Chest: Effort normal. No respiratory distress.  Musculoskeletal:  On examination there is no tenderness of the shoulder or shoulder joint. No tenderness to the trapezius or surrounding musculature. Pain in which he is complaining of located in the upper deltoid muscle. There is a small annular area of puffiness over the pain is located. No discoloration. Distal neurovascular motor sensory is intact. Radial pulse 2+. He is able to lift his  arm to 90 on command however when he is removing his shirt he is able to move his arm directly over the shoulder to approximately 80.   Neurological: He is alert and oriented to person, place, and time. No cranial nerve deficit.  Skin: Skin is warm and  dry.  Psychiatric: He has a normal mood and affect.    ED Course  Procedures (including critical care time) Labs Review Labs Reviewed - No data to display Imaging Review No results found.    MDM   Final diagnoses:  Deltoid bursitis    Diclofenac gel Tramadol 50 mg as dir #73 Heat application     Janne Napoleon, NP 12/23/13 1501

## 2013-12-23 NOTE — ED Notes (Signed)
Pt c/o right shoulder pain onset 2 weeks Pain radiates down to right arm Hx of MS Denies inj/trauma, strenuous activity He is alert w/no signs of acute distress.

## 2013-12-24 NOTE — ED Provider Notes (Signed)
Medical screening examination/treatment/procedure(s) were performed by resident physician or non-physician practitioner and as supervising physician I was immediately available for consultation/collaboration.   Barkley Bruns MD.   Linna Hoff, MD 12/24/13 1145

## 2013-12-29 ENCOUNTER — Ambulatory Visit: Payer: Self-pay | Admitting: Oncology

## 2013-12-30 ENCOUNTER — Other Ambulatory Visit: Payer: Self-pay | Admitting: Neurology

## 2014-01-03 NOTE — Telephone Encounter (Signed)
RX has been faxed.

## 2014-01-30 ENCOUNTER — Ambulatory Visit: Payer: Medicare Other | Admitting: Nurse Practitioner

## 2014-01-30 ENCOUNTER — Telehealth: Payer: Self-pay | Admitting: Nurse Practitioner

## 2014-01-30 NOTE — Telephone Encounter (Signed)
No show for scheduled appt 

## 2014-02-07 ENCOUNTER — Ambulatory Visit: Payer: Self-pay | Admitting: Oncology

## 2014-02-08 ENCOUNTER — Ambulatory Visit: Payer: Self-pay | Admitting: Oncology

## 2014-02-09 NOTE — Telephone Encounter (Signed)
Closing encounter

## 2014-03-03 ENCOUNTER — Ambulatory Visit (INDEPENDENT_AMBULATORY_CARE_PROVIDER_SITE_OTHER): Payer: Medicare Other | Admitting: General Surgery

## 2014-03-10 ENCOUNTER — Ambulatory Visit: Payer: Self-pay | Admitting: Oncology

## 2014-03-20 ENCOUNTER — Ambulatory Visit (INDEPENDENT_AMBULATORY_CARE_PROVIDER_SITE_OTHER): Payer: Medicare Other | Admitting: General Surgery

## 2014-03-20 ENCOUNTER — Encounter (INDEPENDENT_AMBULATORY_CARE_PROVIDER_SITE_OTHER): Payer: Self-pay | Admitting: General Surgery

## 2014-03-20 VITALS — BP 144/90 | HR 66 | Temp 98.4°F | Resp 16 | Ht 69.0 in | Wt 196.0 lb

## 2014-03-20 DIAGNOSIS — I872 Venous insufficiency (chronic) (peripheral): Secondary | ICD-10-CM

## 2014-03-20 DIAGNOSIS — G35 Multiple sclerosis: Secondary | ICD-10-CM

## 2014-03-20 NOTE — Progress Notes (Signed)
Patient ID: [name], male   DOB: Jun 02, 1961, 53 y.o.   MRN: [id number]  Chief Complaint  Patient presents with  . eval for [facility] placement    Note: This dictation was prepared with Dragon/digital dictation along with Smartphrase technology. Any transcriptional errors that result from this process are unintentional.    HPI [name] is a 53 y.o. male.  He is referred by Dr. Nolene Ebbs for Port-A-Cath placement because of poor IV access, and multiple sclerosis. His neurologist is Dr. Delphia Grates at [facility] neurology  The patient states that he was diagnosed with multiple sclerosis is 2005. 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 He also had a history of a pulmonary embolism several years ago and is on Coumadin, and that is managed by Dr. Jeanie Cooks He gets monthly IV therapy which is a great help to him.  He is on multiple medications including Coumadin, verapamil, Imitrex, Percocet., Prilosec, lisinopril, Neurontin, Proventil inhaler, and multiple other multiple sclerosis meds. He lives with his family. He denies any history of chest or abdominal surgery. Denies any history of sternal or clavicular fracture.  HPI  Past Medical History  Diagnosis Date  . Gallstones 02/17/2009  . Multiple sclerosis   . Pulmonary embolism     recurrent  . Chronic pain syndrome 01/25/2008  . Insomnia 11/06/2008  . DVT (deep vein thrombosis) in pregnancy 02/19/2009  . Depression   . HTN (hypertension)   . Anxiety   . Other specified visual disturbances   . Pain in limb   . Nonspecific elevation of levels of transaminase or lactic acid dehydrogenase (LDH)   . Trigeminal neuralgia   . Other pulmonary embolism and infarction   . Abdominal pain, unspecified site   . Headache(784.0)   . Benign paroxysmal positional vertigo   . Other syndromes affecting cervical region   . Other B-complex deficiencies   . Memory loss   . Pain in limb   . Gait abnormality     Past  Surgical History  Procedure Laterality Date  . Cholecystectomy  02/20/2009    Family History  Problem Relation Age of Onset  . Cancer Father   . Diabetes Mother     Social History History  Substance Use Topics  . Smoking status: Current Every Day Smoker -- 0.50 packs/day for 20 years    Types: Cigarettes  . Smokeless tobacco: Never Used  . Alcohol Use: 0.0 oz/week    No Known Allergies  Current Outpatient Prescriptions  Medication Sig Dispense Refill  . albuterol (PROVENTIL HFA;VENTOLIN HFA) 108 (90 BASE) MCG/ACT inhaler Inhale 2 puffs into the lungs 4 (four) times daily.      . AMPYRA 10 MG TB12 Take 1 tablet by mouth two  times daily  60 tablet  12  . baclofen (LIORESAL) 20 MG tablet Take 20 mg by mouth 4 (four) times daily.       [name] BETASERON 0.3 MG KIT injection INJECT 1ML SUBCUTANEOUSLY EVERY OTHER DAY  14 kit  6  . cyclobenzaprine (FLEXERIL) 10 MG tablet Take 10 mg by mouth daily.      [name] dalfampridine (AMPYRA) 10 MG TB12 Take 1 tablet (10 mg total) by mouth 2 (two) times daily.  180 tablet  3  . diclofenac sodium (VOLTAREN) 1 % GEL Apply 1 application topically 4 (four) times daily.  100 g  0  . Eszopiclone 3 MG TABS TAKE 1 TABLET BY MOUTH AT BEDTIME AS NEEDED TAKE IMMEADIATELY BEFORE  BED  30 tablet  3  . gabapentin (NEURONTIN) 300 MG capsule Take 1 capsule (300 mg total) by mouth 3 (three) times daily.  90 capsule  12  . gabapentin (NEURONTIN) 600 MG tablet Take 600 mg by mouth 3 (three) times daily.        [name] lisinopril-hydrochlorothiazide (PRINZIDE,ZESTORETIC) 10-12.5 MG per tablet Take 1 tablet by mouth daily.      . natalizumab (TYSABRI) 300 MG/15ML injection Inject into the vein.      [name] omeprazole (PRILOSEC) 20 MG capsule Take 20 mg by mouth daily.      . Oxcarbazepine (TRILEPTAL) 300 MG tablet Take 300 mg by mouth 3 (three) times daily.      [name] oxyCODONE-acetaminophen (PERCOCET) 5-325 MG per tablet Take 1 tablet by mouth 4 (four) times daily as needed for pain.        . polyethylene glycol powder (GLYCOLAX/MIRALAX) powder Take 17 g by mouth 3 (three) times daily.  255 g  0  . sertraline (ZOLOFT) 50 MG tablet Take 50 mg by mouth daily.        . SUMAtriptan (IMITREX) 50 MG tablet TAKE 1 TABLET BY MOUTH AS NEEDED (MAX 2 TABS IN 24 HOURS)  15 tablet  6  . verapamil (CALAN) 80 MG tablet Take 80 mg by mouth 2 (two) times daily.      [name] warfarin (COUMADIN) 5 MG tablet Take 5 mg by mouth daily.        No current facility-administered medications for this visit.    Review of Systems Review of Systems  Constitutional: Negative for fever, chills and unexpected weight change.  HENT: Negative for congestion, hearing loss, sore throat, trouble swallowing and voice change.   Eyes: Negative for visual disturbance.  Respiratory: Negative for cough and wheezing.   Cardiovascular: Negative for chest pain, palpitations and leg swelling.  Gastrointestinal: Negative for nausea, vomiting, abdominal pain, diarrhea, constipation, blood in stool, abdominal distention, anal bleeding and rectal pain.  Genitourinary: Negative for hematuria and difficulty urinating.  Musculoskeletal: Positive for gait problem. Negative for arthralgias.  Skin: Negative for rash and wound.  Neurological: Positive for weakness. Negative for seizures, syncope and headaches.  Hematological: Negative for adenopathy. Does not bruise/bleed easily.  Psychiatric/Behavioral: Negative for confusion.    Blood pressure 144/90, pulse 66, temperature 98.4 F (36.9 C), resp. rate 16, height $RemoveBe'5\' 9"'fhwfEUPSm$  (1.753 m), weight 196 lb (88.905 kg).  Physical Exam Physical Exam  Constitutional: He is oriented to person, place, and time. He appears well-developed and well-nourished. No distress.  Pleasant. Alert. Excellent in slight into his health problems. In a wheelchair.  HENT:  Head: Normocephalic.  Nose: Nose normal.  Mouth/Throat: No oropharyngeal exudate.  Eyes: Conjunctivae and EOM are normal. Pupils are equal,  round, and reactive to light. Right eye exhibits no discharge. Left eye exhibits no discharge. No scleral icterus.  Neck: Normal range of motion. Neck supple. No JVD present. No tracheal deviation present. No thyromegaly present.  Cardiovascular: Normal rate, regular rhythm, normal heart sounds and intact distal pulses.   No murmur heard. Pulmonary/Chest: Effort normal and breath sounds normal. No stridor. No respiratory distress. He has no wheezes. He has no rales. He exhibits no tenderness.  Lungs sound clear. Clavicles and sternum feel normal. No neck swelling.  Abdominal: Soft. Bowel sounds are normal. He exhibits no distension and no mass. There is no tenderness. There is no rebound and no guarding.  protuberant  Musculoskeletal: Normal range of motion. He exhibits no  edema and no tenderness.  Lymphadenopathy:    He has no cervical adenopathy.  Neurological: He is alert and oriented to person, place, and time. He has normal reflexes. Coordination normal.  Skin: Skin is warm and dry. No rash noted. He is not diaphoretic. No erythema. No pallor.  Psychiatric: He has a normal mood and affect. His behavior is normal. Judgment and thought content normal.    Data Reviewed Neurology note from [facility].  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  He has developed poor IV access a Port-A-Cath has been requested by his neurologist and his PCP, Dr. Jeanie Cooks   History of pulmonary embolism, currently anticoagulated on Coumadin  Status post laparoscopic cholecystectomy  Hypertension  GERD      Plan    Scheduled for Port-A-Cath insertion under general anesthesia  Discontinue Coumadin 5 days preop, once approved by Dr. Jeanie Cooks. We'll defer to Dr. Jeanie Cooks whether he needs a Lovenox bridge or not. It has been more than 5 years since his pulmonary embolism.  I discussed the indications for details, techniques, and numerous risks of the surgery  with him. He is aware of risk of bleeding, infection, pneumothorax,   chest tube insertion, cardiac arrhythmia, cardiac, pulmonary, and thromboembolic problems. Catheter malfunction requiring revision, and other unforeseen problems. He understands these issues. All his questions were answered. He agrees with this plan.       Edsel Petrin. Dalbert Batman, M.D., [facility] General and Minimally invasive Surgery Breast and Colorectal Surgery Office:   [phone] Pager:   [phone]  03/20/2014, 5:34 PM

## 2014-03-20 NOTE — Patient Instructions (Signed)
We have discussed the indications, details, and risks of Port-A-Cath insertion. This seems to be a reasonable thing both to you and to Dr. Derrell LollingIngram.  Gary FleetYou'll be scheduled for Port-A-Cath insertion under general anesthesia in the near future.  We will ask Dr. Concepcion ElkAvbuere for permission to stop your Coumadin 5 days before surgery so you won't have any bleeding problems.  We will try very hard to do this surgery before May 27.       [facility] Guide An implanted port is a type of central line that is placed under the skin. Central lines are used to provide IV access when treatment or nutrition needs to be given through a person's veins. Implanted ports are used for long-term IV access. An implanted port may be placed because:   You need IV medicine that would be irritating to the small veins in your hands or arms.   You need long-term IV medicines, such as antibiotics.   You need IV nutrition for a long period.   You need frequent blood draws for lab tests.   You need dialysis.  Implanted ports are usually placed in the chest area, but they can also be placed in the upper arm, the abdomen, or the leg. An implanted port has two main parts:   Reservoir. The reservoir is round and will appear as a small, raised area under your skin. The reservoir is the part where a needle is inserted to give medicines or draw blood.   Catheter. The catheter is a thin, flexible tube that extends from the reservoir. The catheter is placed into a large vein. Medicine that is inserted into the reservoir goes into the catheter and then into the vein.  HOW WILL I CARE FOR MY INCISION SITE? Do not get the incision site wet. Bathe or shower as directed by your health care provider.  HOW IS MY PORT ACCESSED? Special steps must be taken to access the port:   Before the port is accessed, a numbing cream can be placed on the skin. This helps numb the skin over the port site.   Your health care provider  uses a sterile technique to access the port.  Your health care provider must put on a mask and sterile gloves.  The skin over your port is cleaned carefully with an antiseptic and allowed to dry.  The port is gently pinched between sterile gloves, and a needle is inserted into the port.  Only "non-coring" port needles should be used to access the port. Once the port is accessed, a blood return should be checked. This helps ensure that the port is in the vein and is not clogged.   If your port needs to remain accessed for a constant infusion, a clear (transparent) bandage will be placed over the needle site. The bandage and needle will need to be changed every week, or as directed by your health care provider.   Keep the bandage covering the needle clean and dry. Do not get it wet. Follow your health care provider's instructions on how to take a shower or bath while the port is accessed.   If your port does not need to stay accessed, no bandage is needed over the port.  WHAT IS FLUSHING? Flushing helps keep the port from getting clogged. Follow your health care provider's instructions on how and when to flush the port. Ports are usually flushed with saline solution or a medicine called heparin. The need for flushing will depend on how  the port is used.   If the port is used for intermittent medicines or blood draws, the port will need to be flushed:   After medicines have been given.   After blood has been drawn.   As part of routine maintenance.   If a constant infusion is running, the port may not need to be flushed.  HOW LONG WILL MY PORT STAY IMPLANTED? The port can stay in for as long as your health care provider thinks it is needed. When it is time for the port to come out, surgery will be done to remove it. The procedure is similar to the one performed when the port was put in.  WHEN SHOULD I SEEK IMMEDIATE MEDICAL CARE? When you have an implanted port, you should seek  immediate medical care if:   You notice a bad smell coming from the incision site.   You have swelling, redness, or drainage at the incision site.   You have more swelling or pain at the port site or the surrounding area.   You have a fever that is not controlled with medicine. Document Released: 10/27/2005 Document Revised: 08/17/2013 Document Reviewed: 07/04/2013 [facility] Patient Information 2014 [employer], [place]. Implanted Port Insertion An implanted port is a central line that has a round shape and is placed under the skin. It is used as a long-term IV access for:   Medicines, such as chemotherapy.   Fluids.   Liquid nutrition, such as total parenteral nutrition (TPN).   Blood samples.  LET [facility] CARE PROVIDER KNOW ABOUT:  Allergies to food or medicine.   Medicines taken, including vitamins, herbs, eye drops, creams, and over-the-counter medicines.   Any allergies to heparin.  Use of steroids (by mouth or creams).   Previous problems with anesthetics or numbing medicines.   History of bleeding problems or blood clots.   Previous surgery.   Other health problems, including diabetes and kidney problems.   Possibility of pregnancy, if this applies. RISKS AND COMPLICATIONS  Damage to the blood vessel, bruising, or bleeding at the puncture site.   Infection.  Blood clot in the vessel that the port is in.  Breakdown of the skin over your port.  Very rarely a person may develop a condition called a pneumothorax, a collection of air in the chest that may cause one of the lungs to collapse. The placement of these catheters with the appropriate imaging guidance significantly decreases the risk of a pneumothorax.  BEFORE THE PROCEDURE   Your health care provider may want you to have blood tests. These tests can help tell how well your kidneys and liver are working. They can also show how well your blood clots.   If you take blood thinners  (anticoagulant medicines), ask your health care provider when you should stop taking them.   Make arrangements for someone to drive you home. This is necessary if you have been sedated for your procedure.  PROCEDURE  Port insertion usually takes about 30 45 minutes.   An IV needle will be inserted in your arm. Medicine for pain and medicine to help relax you (sedative) will flow directly into your body through this needle.   You will lie on an exam table, and you will be connected to monitors to keep track of your heart rate, blood pressure, and breathing throughout the procedure.  An oxygen monitoring device may be attached to your finger. Oxygen will be given.   Everything is kept as germ free (sterile) as  possible during the procedure. The skin near the point of the incision will be cleaned with antiseptic, and the area will be draped with sterile towels. The skin and deeper tissues over the port area will be made numb with a local anesthetic.  Two small cuts (incisions) will be made in the skin to insert the port. One will be made in the neck to obtain access to the vein where the catheter will lie.   Because the port reservoir is placed under the skin, a small skin incision is made in the upper chest, and a small pocket for the port is made under the skin. The catheter connected to the port tunnels to a large central vein in the chest. A small, raised area remains on your body at the site of the reservoir when the procedure is complete.  The port placement is done under imaging guidance to ensure the proper placement.  The reservoir has a silicone covering that can be punctured with a special needle.   The port is flushed with normal saline, and blood is drawn to make sure it is working properly.  There is nothing remaining outside the skin when the procedure is finished.   Incisions are held together by stitches, surgical glue, or a special tape.  AFTER THE PROCEDURE  You  will stay in a recovery area until the anesthesia has worn off. Your blood pressure and pulse will be checked.  A final chest X-ray is taken to check the placement of the port and that there is no injury to your lung.  If there are no problems, you should be able to go home after the procedure.  Document Released: 08/17/2013 Document Reviewed: 07/04/2013 [facility] Patient Information 2014 [place], [place].

## 2014-03-22 ENCOUNTER — Telehealth (INDEPENDENT_AMBULATORY_CARE_PROVIDER_SITE_OTHER): Payer: Self-pay

## 2014-03-22 ENCOUNTER — Encounter (HOSPITAL_COMMUNITY): Payer: Self-pay | Admitting: Pharmacy Technician

## 2014-03-22 NOTE — Telephone Encounter (Signed)
Spoke with Dr. Concepcion Elk and he okayed the patient for surgery and agreed for him to come off his coumadin today.  He called in a Rx for Lovenox for the patient to start this evening. He explained that because the patients surgery is 1325 on 5/18, he will be able to take his lovenox the morning of surgery as long as it is 4 hours prior to the surgery time.  He would like the patient to start his coumadin back starting 5/19 evening.  Informed him that I would let the patient know of this information, as well as Dr. Derrell Lolling.

## 2014-03-22 NOTE — Telephone Encounter (Signed)
I called Dr Avbuere's office again at 12:35 pm. I spoke with Morrie SheldonAshley and advised her the importance of getting call back asap today re: pt stopping coumadin today. She advised me that the MD is seeing pts and once he has finished clinic she will call back with response even though there office closed after 1:00pm. I advised her I will forward this to Dr Derrell LollingIngram.

## 2014-03-22 NOTE — Telephone Encounter (Signed)
I spoke with Dr Avbuere's office re: need to stop coumadin today for surgery on 03-27-14. She states she will review this with MD and call back today with answer.

## 2014-03-22 NOTE — Telephone Encounter (Signed)
Spoke with pt and informed him of the information below.  He verbalized understanding for the instructions on coumadin and lovenox.

## 2014-03-23 NOTE — H&P (Signed)
[name]   MRN:  [id number]   Description: 53 year old male  Provider: Adin Hector, MD  Department: [facility]              Diagnoses      Multiple sclerosis    -  Primary      340             Reason for Visit      eval for [facility] placement             Current Vitals Most recent update: 03/20/2014  4:28 PM by Vale Haven, CMA      BP Pulse Temp(Src) Resp Ht Wt      144/90 66 98.4 F (36.9 C) 16 $Rem'5\' 9"'fRBa$  (1.753 m) 196 lb (88.905 kg)      BMI 28.93 kg/m2                     History and Physical   Adin Hector, MD    Status: Signed            Patient ID: [name], male   DOB: 01/21/1961, 53 y.o.   MRN: [id number]                Note:  This dictation was prepared with Dragon/digital dictation along with [facility] technology. Any transcriptional errors that result from this process are unintentional.       HPI [name] is a 54 y.o. male.  He is referred by Dr. Nolene Ebbs for Port-A-Cath placement because of poor IV access, and multiple sclerosis. His neurologist is Dr. Delphia Grates at [facility]   The patient states that he was diagnosed with multiple sclerosis is 2005. 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 He also had a history of a pulmonary embolism several years ago and is on Coumadin, and that is managed by Dr. Jeanie Cooks He gets monthly IV therapy which is a great help to him.   He is on multiple medications including Coumadin, verapamil, Imitrex, Percocet., Prilosec, lisinopril, Neurontin, Proventil inhaler, and multiple other multiple sclerosis meds. He lives with his family. He denies any history of chest or abdominal surgery. Denies any history of sternal or clavicular fracture.         Past Medical History   Diagnosis  Date   .  Gallstones  02/17/2009   .  Multiple sclerosis     .  Pulmonary embolism         recurrent   .  Chronic pain syndrome  01/25/2008   .   Insomnia  11/06/2008   .  DVT (deep vein thrombosis) in pregnancy  02/19/2009   .  Depression     .  HTN (hypertension)     .  Anxiety     .  Other specified visual disturbances     .  Pain in limb     .  Nonspecific elevation of levels of transaminase or lactic acid dehydrogenase (LDH)     .  Trigeminal neuralgia     .  Other pulmonary embolism and infarction     .  Abdominal pain, unspecified site     .  Headache(784.0)     .  Benign paroxysmal positional vertigo     .  Other syndromes affecting cervical region     .  Other B-complex deficiencies     .  Memory loss     .  Pain in limb     .  Gait abnormality           Past Surgical History   Procedure  Laterality  Date   .  Cholecystectomy    02/20/2009         Family History   Problem  Relation  Age of Onset   .  Cancer  Father     .  Diabetes  Mother          Social History History   Substance Use Topics   .  Smoking status:  Current Every Day Smoker -- 0.50 packs/day for 20 years       Types:  Cigarettes   .  Smokeless tobacco:  Never Used   .  Alcohol Use:  0.0 oz/week        No Known Allergies    Current Outpatient Prescriptions   Medication  Sig  Dispense  Refill   .  albuterol (PROVENTIL HFA;VENTOLIN HFA) 108 (90 BASE) MCG/ACT inhaler  Inhale 2 puffs into the lungs 4 (four) times daily.         .  AMPYRA 10 MG TB12  Take 1 tablet by mouth two  times daily   60 tablet   12   .  baclofen (LIORESAL) 20 MG tablet  Take 20 mg by mouth 4 (four) times daily.          [name]  BETASERON 0.3 MG KIT injection  INJECT 1ML SUBCUTANEOUSLY EVERY OTHER DAY   14 kit   6   .  cyclobenzaprine (FLEXERIL) 10 MG tablet  Take 10 mg by mouth daily.         [name]  dalfampridine (AMPYRA) 10 MG TB12  Take 1 tablet (10 mg total) by mouth 2 (two) times daily.   180 tablet   3   .  diclofenac sodium (VOLTAREN) 1 % GEL  Apply 1 application topically 4 (four) times daily.   100 g   0   .  Eszopiclone 3 MG TABS  TAKE 1 TABLET BY MOUTH AT  BEDTIME AS NEEDED TAKE IMMEADIATELY BEFORE BED   30 tablet   3   .  gabapentin (NEURONTIN) 300 MG capsule  Take 1 capsule (300 mg total) by mouth 3 (three) times daily.   90 capsule   12   .  gabapentin (NEURONTIN) 600 MG tablet  Take 600 mg by mouth 3 (three) times daily.           [name]  lisinopril-hydrochlorothiazide (PRINZIDE,ZESTORETIC) 10-12.5 MG per tablet  Take 1 tablet by mouth daily.         .  natalizumab (TYSABRI) 300 MG/15ML injection  Inject into the vein.         [name]  omeprazole (PRILOSEC) 20 MG capsule  Take 20 mg by mouth daily.         .  Oxcarbazepine (TRILEPTAL) 300 MG tablet  Take 300 mg by mouth 3 (three) times daily.         [name]  oxyCODONE-acetaminophen (PERCOCET) 5-325 MG per tablet  Take 1 tablet by mouth 4 (four) times daily as needed for pain.          .  polyethylene glycol powder (GLYCOLAX/MIRALAX) powder  Take 17 g by mouth 3 (three) times daily.   255 g   0   .  sertraline (ZOLOFT) 50 MG tablet  Take 50 mg by mouth daily.           [name]  SUMAtriptan (IMITREX) 50 MG tablet  TAKE 1 TABLET BY MOUTH AS NEEDED (MAX 2 TABS IN 24 HOURS)   15 tablet   6   .  verapamil (CALAN) 80 MG tablet  Take 80 mg by mouth 2 (two) times daily.         [name]  warfarin (COUMADIN) 5 MG tablet  Take 5 mg by mouth daily.                    Review of Systems Review of Systems  Constitutional: Negative for fever, chills and unexpected weight change.  HENT: Negative for congestion, hearing loss, sore throat, trouble swallowing and voice change.   Eyes: Negative for visual disturbance.  Respiratory: Negative for cough and wheezing.   Cardiovascular: Negative for chest pain, palpitations and leg swelling.  Gastrointestinal: Negative for nausea, vomiting, abdominal pain, diarrhea, constipation, blood in stool, abdominal distention, anal bleeding and rectal pain.  Genitourinary: Negative for hematuria and difficulty urinating.  Musculoskeletal: Positive for gait problem. Negative for arthralgias.  Skin:  Negative for rash and wound.  Neurological: Positive for weakness. Negative for seizures, syncope and headaches.  Hematological: Negative for adenopathy. Does not bruise/bleed easily.  Psychiatric/Behavioral: Negative for confusion.      Blood pressure 144/90, pulse 66, temperature 98.4 F (36.9 C), resp. rate 16, height $RemoveBe'5\' 9"'kTvrRpCgn$  (1.753 m), weight 196 lb (88.905 kg).   Physical Exam Physical Exam  Constitutional: He is oriented to person, place, and time. He appears well-developed and well-nourished. No distress.  Pleasant. Alert. Excellent in slight into his health problems. In a wheelchair.  HENT:   Head: Normocephalic.   Nose: Nose normal.   Mouth/Throat: No oropharyngeal exudate.  Eyes: Conjunctivae and EOM are normal. Pupils are equal, round, and reactive to light. Right eye exhibits no discharge. Left eye exhibits no discharge. No scleral icterus.  Neck: Normal range of motion. Neck supple. No JVD present. No tracheal deviation present. No thyromegaly present.  Cardiovascular: Normal rate, regular rhythm, normal heart sounds and intact distal pulses.    No murmur heard. Pulmonary/Chest: Effort normal and breath sounds normal. No stridor. No respiratory distress. He has no wheezes. He has no rales. He exhibits no tenderness.  Lungs sound clear. Clavicles and sternum feel normal. No neck swelling.  Abdominal: Soft. Bowel sounds are normal. He exhibits no distension and no mass. There is no tenderness. There is no rebound and no guarding.  protuberant  Musculoskeletal: Normal range of motion. He exhibits no edema and no tenderness.  Lymphadenopathy:    He has no cervical adenopathy.  Neurological: He is alert and oriented to person, place, and time. He has normal reflexes. Coordination normal.  Skin: Skin is warm and dry. No rash noted. He is not diaphoretic. No erythema. No pallor.  Psychiatric: He has a normal mood and affect. His behavior is normal. Judgment and thought content  normal.      Data Reviewed Neurology note from [facility].   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   He has developed poor IV access a Port-A-Cath has been requested by his neurologist and his PCP, Dr. Jeanie Cooks    History of pulmonary embolism, currently anticoagulated on Coumadin   Status post laparoscopic cholecystectomy   Hypertension   GERD         Plan    Scheduled for Port-A-Cath insertion under general anesthesia   Discontinue Coumadin 5 days preop, once approved by Dr. Jeanie Cooks.  We'll defer to Dr. Jeanie Cooks whether he needs a Lovenox bridge or not. It has been more than 5 years since his pulmonary embolism.   I discussed the indications for details, techniques, and numerous risks of the surgery with him. He is aware of risk of bleeding, infection, pneumothorax,   chest tube insertion, cardiac arrhythmia, cardiac, pulmonary, and thromboembolic problems. Catheter malfunction requiring revision, and other unforeseen problems. He understands these issues. All his questions were answered. He agrees with this plan.          Edsel Petrin. Dalbert Batman, M.D., [facility] General and Minimally invasive Surgery Breast and Colorectal Surgery Office:   [phone] Pager:   [phone]

## 2014-03-23 NOTE — Pre-Procedure Instructions (Signed)
CHIVAS WATRING  03/23/2014   Your procedure is scheduled on:  03/27/2014  Monday   Report to [facility] Admitting at 11:30 AM.   Call this number if you have problems the morning of surgery: [phone]   Remember:   Do not eat food or drink liquids after midnight.  On SUNDAY   Take these medicines the morning of surgery with A SIP OF WATER: Baclofen, Gabapentin, Trileptal, Zoloft & pain medicine if needed is OK to take   Do not wear jewelry   Do not wear lotions, powders, or perfumes. You may wear deodorant.    Men may shave face and neck.   Do not bring valuables to the hospital.  [facility] is not responsible  for any belongings or valuables.               Contacts, dentures or bridgework may not be worn into surgery.   Leave suitcase in the car. After surgery it may be brought to your room.   For patients admitted to the hospital, discharge time is determined by your                treatment team.               Patients discharged the day of surgery will not be allowed to drive home.  Name and phone number of your driver:   Special Instructions: Special Instructions: [facility] - Preparing for Surgery  Before surgery, you can play an important role.  Because skin is not sterile, your skin needs to be as free of germs as possible.  You can reduce the number of germs on you skin by washing with CHG (chlorahexidine gluconate) soap before surgery.  CHG is an antiseptic cleaner which kills germs and bonds with the skin to continue killing germs even after washing.  Please DO NOT use if you have an allergy to CHG or antibacterial soaps.  If your skin becomes reddened/irritated stop using the CHG and inform your nurse when you arrive at Short Stay.  Do not shave (including legs and underarms) for at least 48 hours prior to the first CHG shower.  You may shave your face.  Please follow these instructions carefully:   1.  Shower with CHG Soap the night before  surgery and the  morning of Surgery.  2.  If you choose to wash your hair, wash your hair first as usual with your  normal shampoo.  3.  After you shampoo, rinse your hair and body thoroughly to remove the  Shampoo.  4.  Use CHG as you would any other liquid soap.  You can apply chg directly to the skin and wash gently with scrungie or a clean washcloth.  5.  Apply the CHG Soap to your body ONLY FROM THE NECK DOWN.    Do not use on open wounds or open sores.  Avoid contact with your eyes, ears, mouth and genitals (private parts).  Wash genitals (private parts)   with your normal soap.  6.  Wash thoroughly, paying special attention to the area where your surgery will be performed.  7.  Thoroughly rinse your body with warm water from the neck down.  8.  DO NOT shower/wash with your normal soap after using and rinsing off   the CHG Soap.  9.  Pat yourself dry with a clean towel.            10 .  Wear clean pajamas.  11.  Place clean sheets on your bed the night of your first shower and do not sleep with pets.  Day of Surgery  Do not apply any lotions/deodorants the morning of surgery.  Please wear clean clothes to the hospital/surgery center.   Please read over the following fact sheets that you were given: Pain Booklet, Coughing and Deep Breathing and Surgical Site Infection Prevention

## 2014-03-24 ENCOUNTER — Encounter (HOSPITAL_COMMUNITY): Payer: Self-pay

## 2014-03-24 ENCOUNTER — Encounter (HOSPITAL_COMMUNITY)
Admission: RE | Admit: 2014-03-24 | Discharge: 2014-03-24 | Disposition: A | Discharge status: Home / Self Care | Payer: Medicare Other | Source: Ambulatory Visit | Attending: General Surgery | Admitting: General Surgery

## 2014-03-24 HISTORY — DX: Acute embolism and thrombosis of unspecified deep veins of unspecified lower extremity: I82.409

## 2014-03-24 LAB — CBC WITH DIFFERENTIAL/PLATELET
BASOS ABS: 0 10*3/uL (ref 0.0–0.1)
BASOS PCT: 0 % (ref 0–1)
Eosinophils Absolute: 0.2 10*3/uL (ref 0.0–0.7)
Eosinophils Relative: 5 % (ref 0–5)
HCT: 42.2 % (ref 39.0–52.0)
Hemoglobin: 14.3 g/dL (ref 13.0–17.0)
Lymphocytes Relative: 50 % — ABNORMAL HIGH (ref 12–46)
Lymphs Abs: 2.2 10*3/uL (ref 0.7–4.0)
MCH: 27.7 pg (ref 26.0–34.0)
MCHC: 33.9 g/dL (ref 30.0–36.0)
MCV: 81.8 fL (ref 78.0–100.0)
Monocytes Absolute: 0.6 10*3/uL (ref 0.1–1.0)
Monocytes Relative: 13 % — ABNORMAL HIGH (ref 3–12)
NEUTROS ABS: 1.4 10*3/uL — AB (ref 1.7–7.7)
Neutrophils Relative %: 32 % — ABNORMAL LOW (ref 43–77)
PLATELETS: 241 10*3/uL (ref 150–400)
RBC: 5.16 MIL/uL (ref 4.22–5.81)
RDW: 15.4 % (ref 11.5–15.5)
WBC: 4.4 10*3/uL (ref 4.0–10.5)

## 2014-03-24 LAB — COMPREHENSIVE METABOLIC PANEL
ALBUMIN: 3.6 g/dL (ref 3.5–5.2)
ALT: 35 U/L (ref 0–53)
AST: 27 U/L (ref 0–37)
Alkaline Phosphatase: 81 U/L (ref 39–117)
BUN: 8 mg/dL (ref 6–23)
CHLORIDE: 101 meq/L (ref 96–112)
CO2: 26 meq/L (ref 19–32)
CREATININE: 0.8 mg/dL (ref 0.50–1.35)
Calcium: 9.3 mg/dL (ref 8.4–10.5)
GFR calc Af Amer: >90 mL/min (ref >90)
Glucose, Bld: 124 mg/dL — ABNORMAL HIGH (ref 70–99)
Potassium: 3.5 mEq/L — ABNORMAL LOW (ref 3.7–5.3)
SODIUM: 141 meq/L (ref 137–147)
Total Bilirubin: 0.2 mg/dL — ABNORMAL LOW (ref 0.3–1.2)
Total Protein: 7.5 g/dL (ref 6.0–8.3)

## 2014-03-24 LAB — APTT: APTT: 38 s — AB (ref 24–37)

## 2014-03-24 LAB — PROTIME-INR
INR: 1.5 — AB (ref 0.00–1.49)
Prothrombin Time: 17.7 seconds — ABNORMAL HIGH (ref 11.6–15.2)

## 2014-03-24 NOTE — Progress Notes (Signed)
Requested EKG from Dr. Sherilyn Banker @ [facility].

## 2014-03-26 MED ORDER — CHLORHEXIDINE GLUCONATE 4 % EX LIQD
1.0000 "application " | Freq: Once | CUTANEOUS | Status: DC
  Filled 2014-03-26: qty 15

## 2014-03-26 MED ORDER — CEFAZOLIN SODIUM-DEXTROSE 2-3 GM-% IV SOLR: 2.0000 g | INTRAVENOUS | Status: AC

## 2014-03-27 ENCOUNTER — Encounter (HOSPITAL_COMMUNITY): Payer: Medicare Other | Admitting: Anesthesiology

## 2014-03-27 ENCOUNTER — Ambulatory Visit (HOSPITAL_COMMUNITY): Payer: Medicare Other

## 2014-03-27 ENCOUNTER — Ambulatory Visit (HOSPITAL_COMMUNITY): Payer: Medicare Other | Admitting: Anesthesiology

## 2014-03-27 ENCOUNTER — Encounter (HOSPITAL_COMMUNITY): Payer: Self-pay | Admitting: Anesthesiology

## 2014-03-27 ENCOUNTER — Ambulatory Visit (HOSPITAL_COMMUNITY)
Admission: RE | Admit: 2014-03-27 | Discharge: 2014-03-27 | Disposition: A | Discharge status: Home / Self Care | Payer: Medicare Other | Source: Ambulatory Visit | Attending: General Surgery | Admitting: General Surgery

## 2014-03-27 ENCOUNTER — Encounter (HOSPITAL_COMMUNITY)
Admission: RE | Disposition: A | Discharge status: Home / Self Care | Payer: Self-pay | Source: Ambulatory Visit | Attending: General Surgery

## 2014-03-27 DIAGNOSIS — Z7901 Long term (current) use of anticoagulants: Secondary | ICD-10-CM | POA: Insufficient documentation

## 2014-03-27 DIAGNOSIS — Z79899 Other long term (current) drug therapy: Secondary | ICD-10-CM | POA: Insufficient documentation

## 2014-03-27 DIAGNOSIS — G35D Multiple sclerosis, unspecified: Secondary | ICD-10-CM | POA: Diagnosis present

## 2014-03-27 DIAGNOSIS — G35 Multiple sclerosis: Secondary | ICD-10-CM

## 2014-03-27 HISTORY — PX: PORTACATH PLACEMENT: SHX2246

## 2014-03-27 SURGERY — INSERTION, TUNNELED CENTRAL VENOUS DEVICE, WITH PORT
Anesthesia: General | Site: Chest

## 2014-03-27 MED ORDER — OXYCODONE HCL 5 MG PO TABS: 5.0000 mg | ORAL_TABLET | Freq: Once | ORAL | Status: DC | PRN

## 2014-03-27 MED ORDER — ONDANSETRON HCL 4 MG/2ML IJ SOLN: 4.0000 mg | Freq: Four times a day (QID) | INTRAMUSCULAR | Status: DC | PRN

## 2014-03-27 MED ORDER — PROPOFOL 10 MG/ML IV BOLUS
INTRAVENOUS | Status: DC | PRN
  Administered 2014-03-27: 200 mg via INTRAVENOUS
  Administered 2014-03-27: 40 mg via INTRAVENOUS

## 2014-03-27 MED ORDER — MIDAZOLAM HCL 5 MG/5ML IJ SOLN
INTRAMUSCULAR | Status: DC | PRN
Start: 2014-03-27 — End: 2014-03-27
  Administered 2014-03-27: 2 mg via INTRAVENOUS

## 2014-03-27 MED ORDER — ONDANSETRON HCL 4 MG/2ML IJ SOLN
INTRAMUSCULAR | Status: DC | PRN
  Administered 2014-03-27: 4 mg via INTRAVENOUS

## 2014-03-27 MED ORDER — DEXTROSE 5 % IV SOLN
INTRAVENOUS | Status: DC | PRN
  Administered 2014-03-27: 13:00:00 via INTRAVENOUS

## 2014-03-27 MED ORDER — FENTANYL CITRATE 0.05 MG/ML IJ SOLN: 25.0000 ug | INTRAMUSCULAR | Status: DC | PRN

## 2014-03-27 MED ORDER — SODIUM CHLORIDE 0.9 % IR SOLN
Status: DC | PRN
  Administered 2014-03-27: 13:00:00

## 2014-03-27 MED ORDER — PROPOFOL 10 MG/ML IV BOLUS
INTRAVENOUS | Status: AC
  Filled 2014-03-27: qty 20

## 2014-03-27 MED ORDER — OXYCODONE HCL 5 MG/5ML PO SOLN: 5.0000 mg | Freq: Once | ORAL | Status: DC | PRN

## 2014-03-27 MED ORDER — FENTANYL CITRATE 0.05 MG/ML IJ SOLN
INTRAMUSCULAR | Status: AC
  Filled 2014-03-27: qty 5

## 2014-03-27 MED ORDER — LIDOCAINE-EPINEPHRINE (PF) 1 %-1:200000 IJ SOLN
INTRAMUSCULAR | Status: DC | PRN
  Administered 2014-03-27: 13 mL

## 2014-03-27 MED ORDER — HYDROCODONE-ACETAMINOPHEN 5-325 MG PO TABS: 1.0000 | ORAL_TABLET | Freq: Four times a day (QID) | ORAL | Status: DC | PRN

## 2014-03-27 MED ORDER — HEPARIN SOD (PORK) LOCK FLUSH 100 UNIT/ML IV SOLN
INTRAVENOUS | Status: AC
  Filled 2014-03-27: qty 5

## 2014-03-27 MED ORDER — FENTANYL CITRATE 0.05 MG/ML IJ SOLN
INTRAMUSCULAR | Status: DC | PRN
  Administered 2014-03-27 (×2): 50 ug via INTRAVENOUS

## 2014-03-27 MED ORDER — 0.9 % SODIUM CHLORIDE (POUR BTL) OPTIME
TOPICAL | Status: DC | PRN
  Administered 2014-03-27: 1000 mL

## 2014-03-27 MED ORDER — LACTATED RINGERS IV SOLN
INTRAVENOUS | Status: DC
  Administered 2014-03-27: 12:00:00 via INTRAVENOUS

## 2014-03-27 MED ORDER — PHENYLEPHRINE HCL 10 MG/ML IJ SOLN
INTRAMUSCULAR | Status: DC | PRN
  Administered 2014-03-27 (×3): 40 ug via INTRAVENOUS

## 2014-03-27 MED ORDER — CEFAZOLIN SODIUM-DEXTROSE 2-3 GM-% IV SOLR
INTRAVENOUS | Status: AC
  Administered 2014-03-27: 2 g via INTRAVENOUS
  Filled 2014-03-27: qty 50

## 2014-03-27 MED ORDER — LACTATED RINGERS IV SOLN
INTRAVENOUS | Status: DC | PRN
  Administered 2014-03-27 (×2): via INTRAVENOUS

## 2014-03-27 MED ORDER — HEPARIN SOD (PORK) LOCK FLUSH 100 UNIT/ML IV SOLN
INTRAVENOUS | Status: DC | PRN
  Administered 2014-03-27: 400 [IU]

## 2014-03-27 MED ORDER — MIDAZOLAM HCL 2 MG/2ML IJ SOLN
INTRAMUSCULAR | Status: AC
  Filled 2014-03-27: qty 2

## 2014-03-27 MED ORDER — LIDOCAINE HCL (CARDIAC) 20 MG/ML IV SOLN
INTRAVENOUS | Status: DC | PRN
  Administered 2014-03-27: 80 mg via INTRAVENOUS

## 2014-03-27 MED ORDER — ONDANSETRON HCL 4 MG/2ML IJ SOLN
INTRAMUSCULAR | Status: AC
  Filled 2014-03-27: qty 2

## 2014-03-27 MED ORDER — LIDOCAINE HCL (CARDIAC) 20 MG/ML IV SOLN
INTRAVENOUS | Status: AC
  Filled 2014-03-27: qty 5

## 2014-03-27 SURGICAL SUPPLY — 58 items
BAG DECANTER FOR FLEXI CONT (MISCELLANEOUS) ×3 IMPLANT
BLADE SURG 10 STRL SS (BLADE) ×3 IMPLANT
BLADE SURG 15 STRL LF DISP TIS (BLADE) ×1 IMPLANT
BLADE SURG 15 STRL SS (BLADE) ×2
BLADE SURG ROTATE 9660 (MISCELLANEOUS) IMPLANT
CANISTER SUCTION 2500CC (MISCELLANEOUS) IMPLANT
CHLORAPREP W/TINT 10.5 ML (MISCELLANEOUS) IMPLANT
CHLORAPREP W/TINT 26ML (MISCELLANEOUS) ×3 IMPLANT
COVER PROBE W GEL 5X96 (DRAPES) IMPLANT
COVER SURGICAL LIGHT HANDLE (MISCELLANEOUS) ×3 IMPLANT
CRADLE DONUT ADULT HEAD (MISCELLANEOUS) ×3 IMPLANT
DECANTER SPIKE VIAL GLASS SM (MISCELLANEOUS) IMPLANT
DERMABOND ADVANCED (GAUZE/BANDAGES/DRESSINGS) ×2
DERMABOND ADVANCED .7 DNX12 (GAUZE/BANDAGES/DRESSINGS) ×1 IMPLANT
DRAPE C-ARM 42X72 X-RAY (DRAPES) ×3 IMPLANT
DRAPE LAPAROSCOPIC ABDOMINAL (DRAPES) ×3 IMPLANT
DRAPE UTILITY 15X26 W/TAPE STR (DRAPE) ×6 IMPLANT
ELECT CAUTERY BLADE 6.4 (BLADE) ×3 IMPLANT
ELECT REM PT RETURN 9FT ADLT (ELECTROSURGICAL) ×3
ELECTRODE REM PT RTRN 9FT ADLT (ELECTROSURGICAL) ×1 IMPLANT
GAUZE SPONGE 4X4 16PLY XRAY LF (GAUZE/BANDAGES/DRESSINGS) ×3 IMPLANT
GLOVE BIOGEL PI IND STRL 7.0 (GLOVE) ×1 IMPLANT
GLOVE BIOGEL PI INDICATOR 7.0 (GLOVE) ×2
GLOVE EUDERMIC 7 POWDERFREE (GLOVE) ×3 IMPLANT
GLOVE SURG SS PI 7.0 STRL IVOR (GLOVE) ×6 IMPLANT
GOWN STRL REUS W/ TWL LRG LVL3 (GOWN DISPOSABLE) ×1 IMPLANT
GOWN STRL REUS W/ TWL XL LVL3 (GOWN DISPOSABLE) ×1 IMPLANT
GOWN STRL REUS W/TWL LRG LVL3 (GOWN DISPOSABLE) ×2
GOWN STRL REUS W/TWL XL LVL3 (GOWN DISPOSABLE) ×2
INTRODUCER 13FR (MISCELLANEOUS) IMPLANT
INTRODUCER COOK 11FR (CATHETERS) IMPLANT
KIT BASIN OR (CUSTOM PROCEDURE TRAY) ×3 IMPLANT
KIT PORT POWER 8FR ISP CVUE (Catheter) ×3 IMPLANT
KIT PORT POWER 9.6FR MRI PREA (Catheter) IMPLANT
KIT PORT POWER ISP 8FR (Catheter) IMPLANT
KIT POWER CATH 8FR (Catheter) IMPLANT
KIT ROOM TURNOVER OR (KITS) ×3 IMPLANT
NEEDLE HYPO 25GX1X1/2 BEV (NEEDLE) ×6 IMPLANT
NS IRRIG 1000ML POUR BTL (IV SOLUTION) ×3 IMPLANT
PACK SURGICAL SETUP 50X90 (CUSTOM PROCEDURE TRAY) ×3 IMPLANT
PAD ARMBOARD 7.5X6 YLW CONV (MISCELLANEOUS) ×6 IMPLANT
PENCIL BUTTON HOLSTER BLD 10FT (ELECTRODE) ×3 IMPLANT
SET INTRODUCER 12FR PACEMAKER (SHEATH) IMPLANT
SET SHEATH INTRODUCER 10FR (MISCELLANEOUS) IMPLANT
SHEATH COOK PEEL AWAY SET 9F (SHEATH) IMPLANT
SURGILUBE 3G PEEL PACK STRL (MISCELLANEOUS) IMPLANT
SUT MNCRL AB 4-0 PS2 18 (SUTURE) ×3 IMPLANT
SUT PROLENE 2 0 CT2 30 (SUTURE) ×3 IMPLANT
SUT VIC AB 3-0 SH 18 (SUTURE) ×3 IMPLANT
SYR 5ML LUER SLIP (SYRINGE) ×3 IMPLANT
SYR BULB 3OZ (MISCELLANEOUS) ×3 IMPLANT
SYR CONTROL 10ML LL (SYRINGE) ×6 IMPLANT
SYRINGE 10CC LL (SYRINGE) ×3 IMPLANT
TOWEL OR 17X24 6PK STRL BLUE (TOWEL DISPOSABLE) IMPLANT
TOWEL OR 17X26 10 PK STRL BLUE (TOWEL DISPOSABLE) ×3 IMPLANT
TUBE CONNECTING 12'X1/4 (SUCTIONS) ×1
TUBE CONNECTING 12X1/4 (SUCTIONS) ×2 IMPLANT
YANKAUER SUCT BULB TIP NO VENT (SUCTIONS) IMPLANT

## 2014-03-27 NOTE — Transfer of Care (Signed)
Immediate Anesthesia Transfer of Care Note  Patient: [name]  Procedure(s) Performed: Procedure(s): INSERTION PORT-A-CATH (N/A)  Patient Location: PACU  Anesthesia Type:General  Level of Consciousness: awake, sedated and patient cooperative  Airway & Oxygen Therapy: Patient Spontanous Breathing and Patient connected to nasal cannula oxygen  Post-op Assessment: Report given to PACU RN and Post -op Vital signs reviewed and stable  Post vital signs: Reviewed and stable  Complications: No apparent anesthesia complications

## 2014-03-27 NOTE — Anesthesia Preprocedure Evaluation (Addendum)
Anesthesia Evaluation  Patient identified by MRN, date of birth, ID band Patient awake    Reviewed: Allergy & Precautions, H&P , NPO status , Patient's Chart, lab work & pertinent test results, reviewed documented beta blocker date and time   Airway Mallampati: II TM Distance: >3 FB Neck ROM: full    Dental  (+) Teeth Intact, Dental Advisory Given   Pulmonary Current Smoker,          Cardiovascular hypertension, Pt. on medications     Neuro/Psych  Headaches, Anxiety Depression MS dx'd 2005  Neuromuscular disease    GI/Hepatic   Endo/Other    Renal/GU      Musculoskeletal   Abdominal   Peds  Hematology   Anesthesia Other Findings Pt snoring in HA; arouses to answer questions then falls back asleep  Reproductive/Obstetrics                         Anesthesia Physical Anesthesia Plan  ASA: II  Anesthesia Plan: General   Post-op Pain Management:    Induction: Intravenous  Airway Management Planned: LMA  Additional Equipment:   Intra-op Plan:   Post-operative Plan:   Informed Consent: I have reviewed the patients History and Physical, chart, labs and discussed the procedure including the risks, benefits and alternatives for the proposed anesthesia with the patient or authorized representative who has indicated his/her understanding and acceptance.     Plan Discussed with: CRNA, Anesthesiologist and Surgeon  Anesthesia Plan Comments:         Anesthesia Quick Evaluation

## 2014-03-27 NOTE — Anesthesia Procedure Notes (Signed)
Procedure Name: LMA Insertion Date/Time: 03/27/2014 12:57 PM Performed by: Darcey Nora B Pre-anesthesia Checklist: Patient identified, Emergency Drugs available, Suction available and Patient being monitored Patient Re-evaluated:Patient Re-evaluated prior to inductionOxygen Delivery Method: Circle system utilized Preoxygenation: Pre-oxygenation with 100% oxygen Intubation Type: IV induction Ventilation: Mask ventilation without difficulty LMA: LMA inserted LMA Size: 5.0 Number of attempts: 1 Placement Confirmation: breath sounds checked- equal and bilateral and positive ETCO2 Tube secured with: taped across cheeks; gauze roll b/t teeth. Dental Injury: Teeth and Oropharynx as per pre-operative assessment

## 2014-03-27 NOTE — Interval H&P Note (Signed)
Mr. [name] presents today with a diagnosis of multiple sclerosis, poor IV access. He is scheduled for Port-A-Cath insertion.  I discussed the indications, details, techniques, and numerous risk of the surgery with him. Goals of the procedure have been outlined. He understands all these issues. All of his questions are answered. He agrees with this plan.   Angelia Mould. Derrell Lolling, M.D., [facility] General and Minimally invasive Surgery Breast and Colorectal Surgery Office:   [phone] Pager:   [phone]

## 2014-03-27 NOTE — Discharge Instructions (Signed)
° ° °  PORT-A-CATH: POST OP INSTRUCTIONS  Always review your discharge instruction sheet given to you by the facility where your surgery was performed.   1. A prescription for pain medication may be given to you upon discharge. Take your pain medication as prescribed, if needed. If narcotic pain medicine is not needed, then you make take acetaminophen (Tylenol) or ibuprofen (Advil) as needed.  2. Take your usually prescribed medications unless otherwise directed. 3. If you need a refill on your pain medication, please contact our office. All narcotic pain medicine now requires a paper prescription.  Phoned in and fax refills are no longer allowed by law.  Prescriptions will not be filled after 5 pm or on weekends.  4. You should follow a light diet for the remainder of the day after your procedure. 5. Most patients will experience some mild swelling and/or bruising in the area of the incision. It may take several days to resolve. 6. It is common to experience some constipation if taking pain medication after surgery. Increasing fluid intake and taking a stool softener (such as Colace) will usually help or prevent this problem from occurring. A mild laxative (Milk of Magnesia or Miralax) should be taken according to package directions if there are no bowel movements after 48 hours.  7. Unless discharge instructions indicate otherwise, you may remove your bandages 48 hours after surgery, and you may shower at that time. You may have steri-strips (small white skin tapes) in place directly over the incision.  These strips should be left on the skin for 7-10 days.  If your surgeon used Dermabond (skin glue) on the incision, you may shower in 24 hours.  The glue will flake off over the next 2-3 weeks.  8. If your port is left accessed at the end of surgery (needle left in port), the dressing cannot get wet and should only by changed by a healthcare professional. When the port is no longer accessed (when the  needle has been removed), follow step 7.   9. ACTIVITIES:  Limit activity involving your arms for the next 72 hours. Do no strenuous exercise or activity for 1 week. You may drive when you are no longer taking prescription pain medication, you can comfortably wear a seatbelt, and you can maneuver your car. 10.You may need to see your doctor in the office for a follow-up appointment.  Please       check with your doctor.  11.When you receive a new Port-a-Cath, you will get a product guide and        ID card.  Please keep them in case you need them.  WHEN TO CALL YOUR DOCTOR [phone]): 1. Fever over 101.0 2. Chills 3. Continued bleeding from incision 4. Increased redness and tenderness at the site 5. Shortness of breath, difficulty breathing   The clinic staff is available to answer your questions during regular business hours. Please dont hesitate to call and ask to speak to one of the nurses or medical assistants for clinical concerns. If you have a medical emergency, go to the nearest emergency room or call 911.  A surgeon from [facility] is always on call at the hospital.     For further information, please visit [email or web address]     Contact Dr. Concepcion Elk today to be sure that he is managing your Lovenox and Coumadin.

## 2014-03-27 NOTE — Op Note (Signed)
Patient Name:           [name]   Date of Surgery:        03/27/2014  Note: This dictation was prepared with Dragon/digital dictation along with [facility] technology. Any transcriptional errors that result from this process are unintentional.   Pre op Diagnosis:      Multiple sclerosis, severe gait disorder, poor IV access  Post op Diagnosis:    Same  Procedure:                 Insertion of 8 French power port ClearView tunneled venous vascular access device, using fluoroscopy for guidance and positioning  Surgeon:                     Angelia Mould. Derrell Lolling, M.D., FACS  Assistant:                      none  Operative Indications:   [name] is a 53 y.o. male. He is referred by Dr. Fleet Contras for Port-A-Cath placement because of poor IV access, and multiple sclerosis. His neurologist is Dr. Harriette Bouillon at [facility] neurology  The patient states that he was diagnosed with multiple sclerosis is 2005. 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 He also had a history of a pulmonary embolism several years ago and is on Coumadin, and that is managed by Dr. Concepcion Elk He gets monthly IV therapy which is a great help to him. Dr. Concepcion Elk has taken him off of Coumadin and  has supervised a Lovenox bridge, and will take care of anticoagulation in the postop period as well. He is on multiple medications including Coumadin, verapamil, Imitrex, Percocet., Prilosec, lisinopril, Neurontin, Proventil inhaler, and multiple other multiple sclerosis meds.    Operative Findings:       The port was placed through the right subclavian vein. Imaging in the operating room looked good, with the tip of the catheter in the superior vena cava just above the right atrium.  Procedure in Detail:          Following the induction of general LMA anesthesia, the patient was positioned with a roll behind his shoulders and his arms tucked at his sides. The neck and chest was prepped and draped in a  sterile fashion. Surgical time out was performed. Intravenous antibiotics were given. 1% Xylocaine with epinephrine was used as local infiltration anesthetic.      A right subclavian venipuncture was performed with a single pass and the guidewire inserted into the superior vena cava under fluoroscopic guidance. A template was marked on the chest wall for positioning of the catheter in the distal superior vena cava. A small incision was made at the wire insertion site. A transverse incision was made below the junction between the medial and middle thirds of the clavicle. Dissection was carried down to the pectoralis fascia and a  subcutaneous pocket was created. I passed the catheter from the wire insertion site to the port pocket site. Using the template marked on the chest wall I measured the catheter and cut at 21 cm in length. The catheter was secured to the port with a locking device and the port and catheter flushed with heparinized saline. The port was sutured to the pectoralis fascia with 3 interrupted sutures of 2-0 Prolene. The dilator and peel-away sheath were inserted over the guidewire into the central venous circulation. The wire and dilator were removed. The  catheter was threaded easily through the peel-away sheath and  the peel-away sheath removed. I had excellent blood return and the catheter flushed easily. Fluoroscopy confirmed that the catheter tip was in the superior vena cava above the right atrial junction and there was no deformity of the catheter anywhere along its course. I flushed the port and catheter with concentrated heparin. The subcutaneous tissues were closed with 3-0 Vicryl sutures and skin incisions closed with subcuticular stitches of 4-0 Monocryl and Dermabond.       The patient tolerated the procedure well was taken to PACU in stable condition where a chest x-ray will be obtained. EBL 10 cc the counts correct. Complications none.     Angelia MouldHaywood M. Derrell LollingIngram, M.D., FACS General  and Minimally Invasive Surgery Breast and Colorectal Surgery  03/27/2014 1:45 PM

## 2014-03-27 NOTE — Anesthesia Postprocedure Evaluation (Signed)
Anesthesia Post Note  Patient: [name]  Procedure(s) Performed: Procedure(s) (LRB): INSERTION PORT-A-CATH (N/A)  Anesthesia type: General  Patient location: PACU  Post pain: Pain level controlled and Adequate analgesia  Post assessment: Post-op Vital signs reviewed, Patient's Cardiovascular Status Stable, Respiratory Function Stable, Patent Airway and Pain level controlled  Last Vitals:  Filed Vitals:   03/27/14 1415  BP: 131/82  Pulse: 83  Temp:   Resp: 21    Post vital signs: Reviewed and stable  Level of consciousness: awake, alert  and oriented  Complications: No apparent anesthesia complications

## 2014-03-27 NOTE — Progress Notes (Signed)
Patient states he is being picked up by transportation. Patient has friend to stay at the house with him. I have instructed him to have whomever is staying with patient to call back to hospital to go over discharge instructions. Patient was able to verbalize back the instructions with clear understanding and no questions asked. VSS

## 2014-03-28 ENCOUNTER — Encounter (HOSPITAL_COMMUNITY): Payer: Self-pay | Admitting: General Surgery

## 2014-04-07 ENCOUNTER — Encounter (INDEPENDENT_AMBULATORY_CARE_PROVIDER_SITE_OTHER): Payer: Self-pay

## 2014-04-08 ENCOUNTER — Telehealth (INDEPENDENT_AMBULATORY_CARE_PROVIDER_SITE_OTHER): Payer: Self-pay | Admitting: Surgery

## 2014-04-08 NOTE — Telephone Encounter (Signed)
Patient of Dr. Derrell LollingIngram.  Patient called with complaints of redness at port site and neck pain.  Redness is located where dressings and steristrips were applied.  No fever, chills, or sweats.  Advised patient to take ibuprofen for neck pain.  Please call on Monday, 6/1.  If persistent symptoms, bring to [facility] office for evaluation.  I told patient if he develops worsening symptoms to come to ER over weekend for evaluation.  Velora Hecklerodd M. Aarushi Hemric, MD, [facility] Office: [phone]

## 2014-04-10 ENCOUNTER — Ambulatory Visit: Payer: Self-pay | Admitting: Oncology

## 2014-04-10 NOTE — Telephone Encounter (Signed)
LMOM for pt to call back. See attached msg from Dr Gerrit Friends. If pt still having symptoms needs to come to office.

## 2014-04-10 NOTE — Telephone Encounter (Signed)
Pt called back stating no swelling,redness or drainage at site. Pt states still sore but discomfort is better. Pt to call if any symptoms occur.

## 2014-05-10 ENCOUNTER — Ambulatory Visit: Payer: Self-pay | Admitting: Oncology

## 2014-05-14 ENCOUNTER — Emergency Department (HOSPITAL_COMMUNITY): Payer: Medicare Other

## 2014-05-14 ENCOUNTER — Emergency Department (HOSPITAL_COMMUNITY)
Admission: EM | Admit: 2014-05-14 | Discharge: 2014-05-15 | Disposition: A | Discharge status: Home / Self Care | Payer: Medicare Other | Attending: Emergency Medicine | Admitting: Emergency Medicine

## 2014-05-14 ENCOUNTER — Encounter (HOSPITAL_COMMUNITY): Payer: Self-pay | Admitting: Emergency Medicine

## 2014-05-14 DIAGNOSIS — R197 Diarrhea, unspecified: Secondary | ICD-10-CM | POA: Diagnosis not present

## 2014-05-14 DIAGNOSIS — G35 Multiple sclerosis: Secondary | ICD-10-CM | POA: Insufficient documentation

## 2014-05-14 DIAGNOSIS — G894 Chronic pain syndrome: Secondary | ICD-10-CM | POA: Insufficient documentation

## 2014-05-14 DIAGNOSIS — M545 Low back pain, unspecified: Secondary | ICD-10-CM | POA: Diagnosis not present

## 2014-05-14 DIAGNOSIS — R1084 Generalized abdominal pain: Secondary | ICD-10-CM | POA: Insufficient documentation

## 2014-05-14 DIAGNOSIS — F17200 Nicotine dependence, unspecified, uncomplicated: Secondary | ICD-10-CM | POA: Insufficient documentation

## 2014-05-14 DIAGNOSIS — Z86718 Personal history of other venous thrombosis and embolism: Secondary | ICD-10-CM | POA: Diagnosis not present

## 2014-05-14 DIAGNOSIS — R109 Unspecified abdominal pain: Secondary | ICD-10-CM | POA: Diagnosis present

## 2014-05-14 DIAGNOSIS — F329 Major depressive disorder, single episode, unspecified: Secondary | ICD-10-CM | POA: Insufficient documentation

## 2014-05-14 DIAGNOSIS — Z7901 Long term (current) use of anticoagulants: Secondary | ICD-10-CM | POA: Insufficient documentation

## 2014-05-14 DIAGNOSIS — Z9089 Acquired absence of other organs: Secondary | ICD-10-CM | POA: Insufficient documentation

## 2014-05-14 DIAGNOSIS — I10 Essential (primary) hypertension: Secondary | ICD-10-CM | POA: Diagnosis not present

## 2014-05-14 DIAGNOSIS — F411 Generalized anxiety disorder: Secondary | ICD-10-CM | POA: Diagnosis not present

## 2014-05-14 DIAGNOSIS — Z86711 Personal history of pulmonary embolism: Secondary | ICD-10-CM | POA: Diagnosis not present

## 2014-05-14 DIAGNOSIS — Z8639 Personal history of other endocrine, nutritional and metabolic disease: Secondary | ICD-10-CM | POA: Diagnosis not present

## 2014-05-14 DIAGNOSIS — Z79899 Other long term (current) drug therapy: Secondary | ICD-10-CM | POA: Diagnosis not present

## 2014-05-14 DIAGNOSIS — Z862 Personal history of diseases of the blood and blood-forming organs and certain disorders involving the immune mechanism: Secondary | ICD-10-CM | POA: Insufficient documentation

## 2014-05-14 DIAGNOSIS — F3289 Other specified depressive episodes: Secondary | ICD-10-CM | POA: Diagnosis not present

## 2014-05-14 LAB — URINALYSIS, ROUTINE W REFLEX MICROSCOPIC
Bilirubin Urine: NEGATIVE
Glucose, UA: NEGATIVE mg/dL
HGB URINE DIPSTICK: NEGATIVE
Ketones, ur: NEGATIVE mg/dL
Leukocytes, UA: NEGATIVE
Nitrite: NEGATIVE
Protein, ur: NEGATIVE mg/dL
SPECIFIC GRAVITY, URINE: 1.028 (ref 1.005–1.030)
UROBILINOGEN UA: 1 mg/dL (ref 0.0–1.0)
pH: 7 (ref 5.0–8.0)

## 2014-05-14 LAB — CBC WITH DIFFERENTIAL/PLATELET
BASOS ABS: 0.1 10*3/uL (ref 0.0–0.1)
Basophils Relative: 1 % (ref 0–1)
EOS ABS: 0.3 10*3/uL (ref 0.0–0.7)
EOS PCT: 4 % (ref 0–5)
HEMATOCRIT: 43.8 % (ref 39.0–52.0)
Hemoglobin: 14.6 g/dL (ref 13.0–17.0)
Lymphocytes Relative: 41 % (ref 12–46)
Lymphs Abs: 3.9 10*3/uL (ref 0.7–4.0)
MCH: 27.7 pg (ref 26.0–34.0)
MCHC: 33.3 g/dL (ref 30.0–36.0)
MCV: 83 fL (ref 78.0–100.0)
Monocytes Absolute: 0.9 10*3/uL (ref 0.1–1.0)
Monocytes Relative: 9 % (ref 3–12)
Neutro Abs: 4.3 10*3/uL (ref 1.7–7.7)
Neutrophils Relative %: 45 % (ref 43–77)
PLATELETS: 268 10*3/uL (ref 150–400)
RBC: 5.28 MIL/uL (ref 4.22–5.81)
RDW: 15.7 % — AB (ref 11.5–15.5)
WBC: 9.4 10*3/uL (ref 4.0–10.5)

## 2014-05-14 LAB — COMPREHENSIVE METABOLIC PANEL
ALBUMIN: 3.9 g/dL (ref 3.5–5.2)
ALT: 39 U/L (ref 0–53)
ANION GAP: 15 (ref 5–15)
AST: 25 U/L (ref 0–37)
Alkaline Phosphatase: 95 U/L (ref 39–117)
BUN: 6 mg/dL (ref 6–23)
CO2: 25 mEq/L (ref 19–32)
Calcium: 9.1 mg/dL (ref 8.4–10.5)
Chloride: 100 mEq/L (ref 96–112)
Creatinine, Ser: 0.66 mg/dL (ref 0.50–1.35)
GFR calc Af Amer: >90 mL/min (ref >90)
GFR calc non Af Amer: >90 mL/min (ref >90)
GLUCOSE: 107 mg/dL — AB (ref 70–99)
Potassium: 3.8 mEq/L (ref 3.7–5.3)
SODIUM: 140 meq/L (ref 137–147)
Total Bilirubin: 0.3 mg/dL (ref 0.3–1.2)
Total Protein: 7.8 g/dL (ref 6.0–8.3)

## 2014-05-14 LAB — LIPASE, BLOOD: Lipase: 40 U/L (ref 11–59)

## 2014-05-14 LAB — PROTIME-INR
INR: 2.49 — ABNORMAL HIGH (ref 0.00–1.49)
Prothrombin Time: 26.9 seconds — ABNORMAL HIGH (ref 11.6–15.2)

## 2014-05-14 LAB — APTT: APTT: 33 s (ref 24–37)

## 2014-05-14 MED ORDER — HYDROMORPHONE HCL PF 1 MG/ML IJ SOLN
0.5000 mg | Freq: Once | INTRAMUSCULAR | Status: AC
  Administered 2014-05-14: 0.5 mg via INTRAVENOUS
  Filled 2014-05-14: qty 1

## 2014-05-14 MED ORDER — IOHEXOL 300 MG/ML  SOLN
25.0000 mL | INTRAMUSCULAR | Status: AC
  Administered 2014-05-14: 25 mL via ORAL

## 2014-05-14 MED ORDER — IOHEXOL 300 MG/ML  SOLN
100.0000 mL | Freq: Once | INTRAMUSCULAR | Status: AC | PRN
  Administered 2014-05-14: 100 mL via INTRAVENOUS

## 2014-05-14 MED ORDER — ONDANSETRON HCL 4 MG/2ML IJ SOLN
4.0000 mg | Freq: Once | INTRAMUSCULAR | Status: AC
  Administered 2014-05-14: 4 mg via INTRAVENOUS
  Filled 2014-05-14: qty 2

## 2014-05-14 MED ORDER — SODIUM CHLORIDE 0.9 % IV BOLUS (SEPSIS)
1000.0000 mL | Freq: Once | INTRAVENOUS | Status: AC
  Administered 2014-05-14: 1000 mL via INTRAVENOUS

## 2014-05-14 MED ORDER — MORPHINE SULFATE 4 MG/ML IJ SOLN
4.0000 mg | Freq: Once | INTRAMUSCULAR | Status: AC
  Administered 2014-05-14: 4 mg via INTRAVENOUS
  Filled 2014-05-14: qty 1

## 2014-05-14 NOTE — ED Notes (Signed)
Pt unable to void at this time for UA sample.

## 2014-05-14 NOTE — ED Notes (Signed)
The pt has been taking percocet at home that is not stopping his pain.  He needs something stronger.  He has ms and he has a porta-cath

## 2014-05-14 NOTE — ED Provider Notes (Signed)
CSN: [id number]     Arrival date & time 05/14/14  1711 History   First MD Initiated Contact with Patient 05/14/14 1815     Chief Complaint  Patient presents with  . Abdominal Pain     (Consider location/radiation/quality/duration/timing/severity/associated sxs/prior Treatment) The history is provided by the patient. No language interpreter was used.  [name] is a 53 y/o M with PMHx of gallstones, multiple sclerosis, pulmonary embolism, chronic pain, depression, hypertension, trigeminal neuralgia, abdominal pain of unspecified site, DVT, cholecystectomy in 2010 and presenting to the ED with abdominal pain for the past 4-5 days. As per patient, reported the pain is localized to the Center of the abdomen surrounding his umbilical region described as a sharp pain that is constant without radiation. Stated that he's been using Percocets 10-325 mg has been helping relieving the pain. Reported that he ran out of pain medications yesterday and has been unable to deal with the pain. Reported that this discomfort is like to decrease his appetite. Reported he had a normal bowel movement today. Denied nausea, vomiting, chest pain, shortness of breath, difficulty breathing, fever, chills, melena, hematochezia, weakness, hematuria, dysuria, neck pain, back pain. PCP Dr. Concepcion Elk  Past Medical History  Diagnosis Date  . Gallstones 02/17/2009  . Multiple sclerosis   . Pulmonary embolism     recurrent  . Chronic pain syndrome 01/25/2008  . Insomnia 11/06/2008  . Depression   . HTN (hypertension)   . Anxiety   . Other specified visual disturbances   . Pain in limb   . Nonspecific elevation of levels of transaminase or lactic acid dehydrogenase (LDH)   . Trigeminal neuralgia   . Other pulmonary embolism and infarction   . Abdominal pain, unspecified site   . Headache(784.0)   . Benign paroxysmal positional vertigo   . Other syndromes affecting cervical region   . Other B-complex deficiencies     . Memory loss   . Pain in limb   . Gait abnormality   . DVT (deep venous thrombosis)    Past Surgical History  Procedure Laterality Date  . Cholecystectomy  02/20/2009  . Portacath placement N/A 03/27/2014    Procedure: INSERTION PORT-A-CATH;  Surgeon: [name], MD;  Location: [facility] OR;  Service: General;  Laterality: N/A;   Family History  Problem Relation Age of Onset  . Cancer Father   . Diabetes Mother    History  Substance Use Topics  . Smoking status: Current Every Day Smoker -- 0.50 packs/day for 20 years    Types: Cigarettes  . Smokeless tobacco: Never Used  . Alcohol Use: No    Review of Systems  Constitutional: Negative for fever and chills.  Respiratory: Negative for chest tightness and shortness of breath.   Cardiovascular: Negative for chest pain.  Gastrointestinal: Positive for abdominal pain and diarrhea. Negative for nausea, vomiting, constipation, blood in stool and anal bleeding.  Genitourinary: Negative for dysuria, hematuria and decreased urine volume.  Musculoskeletal: Positive for back pain (lower back pain ).  Neurological: Negative for weakness.      Allergies  Review of patient's allergies indicates no known allergies.  Home Medications   Prior to Admission medications   Medication Sig Start Date End Date Taking? Authorizing Provider  albuterol (PROVENTIL HFA;VENTOLIN HFA) 108 (90 BASE) MCG/ACT inhaler Inhale 2 puffs into the lungs 2 (two) times daily as needed for shortness of breath.    Yes Historical Provider, MD  baclofen (LIORESAL) 20 MG tablet Take 20 mg  by mouth 4 (four) times daily.    Yes Historical Provider, MD  dalfampridine (AMPYRA) 10 MG TB12 Take 10 mg by mouth 2 (two) times daily.   Yes Historical Provider, MD  Eszopiclone (ESZOPICLONE) 3 MG TABS Take 3 mg by mouth at bedtime. Take immediately before bedtime   Yes Historical Provider, MD  gabapentin (NEURONTIN) 600 MG tablet Take 600 mg by mouth 4 (four) times daily.    Yes  Historical Provider, MD  lisinopril-hydrochlorothiazide (PRINZIDE,ZESTORETIC) 10-12.5 MG per tablet Take 1 tablet by mouth daily.   Yes Historical Provider, MD  methocarbamol (ROBAXIN) 750 MG tablet Take 1,500 mg by mouth 3 (three) times daily as needed for muscle spasms.  02/27/14  Yes Historical Provider, MD  natalizumab (TYSABRI) 300 MG/15ML injection Inject into the vein every 30 (thirty) days.    Yes Historical Provider, MD  Oxcarbazepine (TRILEPTAL) 300 MG tablet Take 300 mg by mouth 3 (three) times daily.   Yes Historical Provider, MD  OxyCODONE (OXYCONTIN) 10 mg T12A 12 hr tablet Take 10 mg by mouth 3 (three) times daily.   Yes Historical Provider, MD  oxyCODONE-acetaminophen (PERCOCET) 10-325 MG per tablet Take 1 tablet by mouth 4 (four) times daily as needed for pain.   Yes Historical Provider, MD  sertraline (ZOLOFT) 50 MG tablet Take 50 mg by mouth daily.    Yes Historical Provider, MD  verapamil (CALAN) 80 MG tablet Take 80 mg by mouth 2 (two) times daily.   Yes Historical Provider, MD  warfarin (COUMADIN) 5 MG tablet Take 5 mg by mouth every evening.    Yes Historical Provider, MD  HYDROcodone-acetaminophen (NORCO/VICODIN) 5-325 MG per tablet Take 1 tablet by mouth every 6 (six) hours as needed for moderate pain or severe pain. 05/15/14   Alfonzia Woolum, PA-C   BP 122/76  Pulse 57  Temp(Src) 97.3 F (36.3 C) (Oral)  Resp 15  SpO2 96% Physical Exam  Nursing note and vitals reviewed. Constitutional: He is oriented to person, place, and time. He appears well-developed and well-nourished. No distress.  HENT:  Head: Normocephalic and atraumatic.  Mouth/Throat: Oropharynx is clear and moist. No oropharyngeal exudate.  Eyes: Conjunctivae and EOM are normal. Pupils are equal, round, and reactive to light. Right eye exhibits no discharge. Left eye exhibits no discharge.  Neck: Normal range of motion. Neck supple. No tracheal deviation present.  Negative neck stiffness Negative  rigidity Negative cervical lymphadenopathy Negative meningeal signs  Cardiovascular: Normal rate, regular rhythm and normal heart sounds.  Exam reveals no friction rub.   No murmur heard. Pulses:      Radial pulses are 2+ on the right side, and 2+ on the left side.       Dorsalis pedis pulses are 2+ on the right side, and 2+ on the left side.  Pulmonary/Chest: Effort normal and breath sounds normal. No respiratory distress. He has no wheezes. He has no rales.  Patient is able to speak in full sentences without difficulty Negative use of accessory muscles Negative stridor  Port-A-Cath placed in the right chest - negative swelling, erythema, signs of infection noted. Negative cellulitic findings. Negative drainage.  Abdominal: Soft. Bowel sounds are normal. He exhibits no distension. There is tenderness in the periumbilical area and suprapubic area. There is no rebound and no guarding.    Negative abdominal distention Bowel sounds normal active in all 4 quadrants Discomfort upon palpation to the umbilical region of the abdomen and suprapubic region Negative guarding or rigidity Negative peritoneal signs  Musculoskeletal: Normal range of motion.  Full ROM to upper and lower extremities without difficulty noted, negative ataxia noted.  Lymphadenopathy:    He has no cervical adenopathy.  Neurological: He is alert and oriented to person, place, and time. No cranial nerve deficit. He exhibits normal muscle tone. Coordination normal.  Cranial nerves III-XII grossly intact Equal grip strength Negative facial drooping Negative slurred speech Negative aphasia  Skin: Skin is warm and dry. No rash noted. He is not diaphoretic. No erythema.  Psychiatric: He has a normal mood and affect. His behavior is normal. Thought content normal.    ED Course  Procedures (including critical care time)  Results for orders placed during the hospital encounter of 05/14/14  CBC WITH DIFFERENTIAL       Result Value Ref Range   WBC 9.4  4.0 - 10.5 K/uL   RBC 5.28  4.22 - 5.81 MIL/uL   Hemoglobin 14.6  13.0 - 17.0 g/dL   HCT 11.9  14.7 - 82.9 %   MCV 83.0  78.0 - 100.0 fL   MCH 27.7  26.0 - 34.0 pg   MCHC 33.3  30.0 - 36.0 g/dL   RDW 56.2 (*) 13.0 - 86.5 %   Platelets 268  150 - 400 K/uL   Neutrophils Relative % 45  43 - 77 %   Neutro Abs 4.3  1.7 - 7.7 K/uL   Lymphocytes Relative 41  12 - 46 %   Lymphs Abs 3.9  0.7 - 4.0 K/uL   Monocytes Relative 9  3 - 12 %   Monocytes Absolute 0.9  0.1 - 1.0 K/uL   Eosinophils Relative 4  0 - 5 %   Eosinophils Absolute 0.3  0.0 - 0.7 K/uL   Basophils Relative 1  0 - 1 %   Basophils Absolute 0.1  0.0 - 0.1 K/uL  COMPREHENSIVE METABOLIC PANEL      Result Value Ref Range   Sodium 140  137 - 147 mEq/L   Potassium 3.8  3.7 - 5.3 mEq/L   Chloride 100  96 - 112 mEq/L   CO2 25  19 - 32 mEq/L   Glucose, Bld 107 (*) 70 - 99 mg/dL   BUN 6  6 - 23 mg/dL   Creatinine, Ser 7.84  0.50 - 1.35 mg/dL   Calcium 9.1  8.4 - 69.6 mg/dL   Total Protein 7.8  6.0 - 8.3 g/dL   Albumin 3.9  3.5 - 5.2 g/dL   AST 25  0 - 37 U/L   ALT 39  0 - 53 U/L   Alkaline Phosphatase 95  39 - 117 U/L   Total Bilirubin 0.3  0.3 - 1.2 mg/dL   GFR calc non Af Amer >90  >90 mL/min   GFR calc Af Amer >90  >90 mL/min   Anion gap 15  5 - 15  LIPASE, BLOOD      Result Value Ref Range   Lipase 40  11 - 59 U/L  URINALYSIS, ROUTINE W REFLEX MICROSCOPIC      Result Value Ref Range   Color, Urine YELLOW  YELLOW   APPearance CLOUDY (*) CLEAR   Specific Gravity, Urine 1.028  1.005 - 1.030   pH 7.0  5.0 - 8.0   Glucose, UA NEGATIVE  NEGATIVE mg/dL   Hgb urine dipstick NEGATIVE  NEGATIVE   Bilirubin Urine NEGATIVE  NEGATIVE   Ketones, ur NEGATIVE  NEGATIVE mg/dL   Protein, ur NEGATIVE  NEGATIVE mg/dL  Urobilinogen, UA 1.0  0.0 - 1.0 mg/dL   Nitrite NEGATIVE  NEGATIVE   Leukocytes, UA NEGATIVE  NEGATIVE  PROTIME-INR      Result Value Ref Range   Prothrombin Time 26.9 (*) 11.6 -  15.2 seconds   INR 2.49 (*) 0.00 - 1.49  APTT      Result Value Ref Range   aPTT 33  24 - 37 seconds    Labs Review Labs Reviewed  CBC WITH DIFFERENTIAL - Abnormal; Notable for the following:    RDW 15.7 (*)    All other components within normal limits  COMPREHENSIVE METABOLIC PANEL - Abnormal; Notable for the following:    Glucose, Bld 107 (*)    All other components within normal limits  URINALYSIS, ROUTINE W REFLEX MICROSCOPIC - Abnormal; Notable for the following:    APPearance CLOUDY (*)    All other components within normal limits  PROTIME-INR - Abnormal; Notable for the following:    Prothrombin Time 26.9 (*)    INR 2.49 (*)    All other components within normal limits  LIPASE, BLOOD  APTT    Imaging Review Ct Abdomen Pelvis W Contrast  05/14/2014   CLINICAL DATA:  Mid abdominal pain.  Loss of appetite.  EXAM: CT ABDOMEN AND PELVIS WITH CONTRAST  TECHNIQUE: Multidetector CT imaging of the abdomen and pelvis was performed using the standard protocol following bolus administration of intravenous contrast.  CONTRAST:  OMNIPAQUE IOHEXOL 300 MG/ML  SOLN  COMPARISON:  08/09/2013  FINDINGS: Atelectasis or infiltration in both lung bases.  Surgical absence of the gallbladder. Scattered tiny sub cm low-attenuation lesions in the liver probably represent small cysts. The pancreas, spleen, adrenal glands, abdominal aorta, inferior vena cava, and retroperitoneal lymph nodes are unremarkable. Subcentimeter parenchymal cysts in both kidneys. No hydronephrosis or solid mass. Stomach, small bowel, and colon are not abnormally distended. Contrast material flows through to the cecum without evidence of obstruction. No free air or free fluid in the abdomen. Abdominal wall musculature appears intact.  Pelvis: Prostate gland is not enlarged. Bladder wall is not thickened. Appendix is normal. Scattered diverticula in the sigmoid colon without evidence of diverticulitis. No free or loculated pelvic  fluid collections. No significant pelvic lymphadenopathy. Calcifications in the subcutaneous fat over the gluteal regions likely represent injection granulomas. Probable prior right inguinal hernia repair. No destructive bone lesions. No significant changes since prior study.  IMPRESSION: No acute process demonstrated in the abdomen or pelvis. Chronic incidental findings are unchanged.   Electronically Signed   By: Burman Nieves M.D.   On: 05/14/2014 23:44     EKG Interpretation None      MDM   Final diagnoses:  Generalized abdominal pain    Medications  iohexol (OMNIPAQUE) 300 MG/ML solution 25 mL (25 mLs Oral Contrast Given 05/14/14 2124)  morphine 4 MG/ML injection 4 mg (4 mg Intravenous Given 05/14/14 2013)  ondansetron (ZOFRAN) injection 4 mg (4 mg Intravenous Given 05/14/14 2013)  sodium chloride 0.9 % bolus 1,000 mL (0 mLs Intravenous Stopped 05/14/14 2208)  HYDROmorphone (DILAUDID) injection 0.5 mg (0.5 mg Intravenous Given 05/14/14 2207)  iohexol (OMNIPAQUE) 300 MG/ML solution 100 mL (100 mLs Intravenous Contrast Given 05/14/14 2317)  HYDROmorphone (DILAUDID) injection 0.5 mg (0.5 mg Intravenous Given 05/15/14 0019)   Filed Vitals:   05/15/14 0000 05/15/14 0030 05/15/14 0100 05/15/14 0130  BP: 127/81 122/82 129/82 122/76  Pulse: 60 57 59 57  Temp:      TempSrc:  Resp: 16 14 19 15   SpO2: 95% 94% 94% 96%   This provider reviewed patient's chart. Patient was seen and assessed in ED setting on August 2014 regarding abdominal pain. Patient reported that the symptoms are very similar to when he was seen last year. Stated that CT was performed with negative findings the patient was discharged home. Reportedly followed up with gastroenterology as an outpatient with negative findings, reported that he was followed by Dr. Elnoria HowardHung. CBC negative elevated white blood cell count-negative left shift or leukocytosis noted. CMP negative findings kidney and liver functioning well. Negative elevated  AST, ALT, alkaline phosphatase of bilirubin. Anion gap of 15.0 mEq per liter. Lipase negative elevation. INR elevated at 2.49, PTT 26.9. APTT 33. Urinalysis negative for nitrites or leukocytes. Negative hemoglobin noted. CT abdomen and pelvis with contrast no acute processes identified. Doubt UTI. Doubt pyelonephritis. Doubt appendicitis. Doubt pancreatitis. Doubt acute abdominal processes. Definitive etiology of abdominal pain unknown. Patient tolerated PO without difficulty. Pain controlled with IV mediations. Discussed labs and imaging in great detail with patient. Patient stable, afebrile. Patient not septic appearing. Discharged patient. Patient reported that he is due to get a refill of his prescriptions for OxyContin on Wednesday-would like something to hold him over until then. Discharged patient with small dose of pain medications - discussed course, precautions, disposal technique. Referred to PCP, GI. Discussed with patient proper diet. Discussed with patient to closely monitor symptoms and if symptoms are to worsen or change to report back to the ED - strict return instructions given.  Patient agreed to plan of care, understood, all questions answered.   2:26 AM Upon discharge, patient became angry that this provider discharged him with Vicodin. Patient reported that he cannot be discharged with Vicodin secondary to not being able to get pain medications refilled on Wednesday. Reported that he is due to get his Percocets filled on Wednesday. Patient became mad at this provider because this provider was not giving the right medications to him. Patient requesting Oxycontin to be given, as per nurse's report. Patient getting mad because he reported that the staff is questioning his intelligence on pain medications. Patient demanding CT scan of abdomen and pelvis to be printed. As this provider was finishing up paperwork patient left.   Raymon MuttonMarissa Michelene Keniston, PA-C 05/15/14 0336  Raymon MuttonMarissa Leeandre Nordling,  PA-C 05/15/14 [phone]

## 2014-05-14 NOTE — ED Notes (Signed)
The pt reports that he cannot void  At present

## 2014-05-14 NOTE — ED Notes (Signed)
The pt is c/o generalized abd pain for 4 days with some diarrhea

## 2014-05-15 DIAGNOSIS — R1084 Generalized abdominal pain: Secondary | ICD-10-CM | POA: Diagnosis not present

## 2014-05-15 MED ORDER — HYDROCODONE-ACETAMINOPHEN 5-325 MG PO TABS: 1.0000 | ORAL_TABLET | Freq: Four times a day (QID) | ORAL | Status: DC | PRN

## 2014-05-15 MED ORDER — HYDROMORPHONE HCL PF 1 MG/ML IJ SOLN
0.5000 mg | Freq: Once | INTRAMUSCULAR | Status: AC
  Administered 2014-05-15: 0.5 mg via INTRAVENOUS

## 2014-05-15 NOTE — Discharge Instructions (Signed)
Please call your doctor for a followup appointment within 24-48 hours. When you talk to your doctor please let them know that you were seen in the emergency department and have them acquire all of your records so that they can discuss the findings with you and formulate a treatment plan to fully care for your new and ongoing problems. Please call and set-up an appointment with your primary care provider Please call and set-up an appointment with gastroenterology to be seen regarding abdominal pain  Please take medication as prescribed-1 pain medications there is to be no drinking alcohol, driving, operating any heavy machinery if there is extra please dispose in a proper manner. Please do not take any extra Tylenol with this medication for this can lead to Tylenol overdose and liver issues. Please rest and stay hydrated Please avoid food that is high in fat and grease Please drink plenty of water  Please continue to monitor symptoms closely and if symptoms are to worsen or change (fever greater than 101, chills, sweating, nausea, vomiting, chest pain, shortness of breath, difficulty breathing, numbness, tingling, worsening or changes to pain pattern, inability to keep any food or fluids down, blood in the stools, black tarry stools) please report back to the ED immediately    Abdominal Pain Many things can cause abdominal pain. Usually, abdominal pain is not caused by a disease and will improve without treatment. It can often be observed and treated at home. Your health care provider will do a physical exam and possibly order blood tests and X-rays to help determine the seriousness of your pain. However, in many cases, more time must pass before a clear cause of the pain can be found. Before that point, your health care provider may not know if you need more testing or further treatment. HOME CARE INSTRUCTIONS  Monitor your abdominal pain for any changes. The following actions may help to alleviate any  discomfort you are experiencing:  Only take over-the-counter or prescription medicines as directed by your health care provider.  Do not take laxatives unless directed to do so by your health care provider.  Try a clear liquid diet (broth, tea, or water) as directed by your health care provider. Slowly move to a bland diet as tolerated. SEEK MEDICAL CARE IF:  You have unexplained abdominal pain.  You have abdominal pain associated with nausea or diarrhea.  You have pain when you urinate or have a bowel movement.  You experience abdominal pain that wakes you in the night.  You have abdominal pain that is worsened or improved by eating food.  You have abdominal pain that is worsened with eating fatty foods.  You have a fever. SEEK IMMEDIATE MEDICAL CARE IF:   Your pain does not go away within 2 hours.  You keep throwing up (vomiting).  Your pain is felt only in portions of the abdomen, such as the right side or the left lower portion of the abdomen.  You pass bloody or black tarry stools. MAKE SURE YOU:  Understand these instructions.   Will watch your condition.   Will get help right away if you are not doing well or get worse.  Document Released: 08/06/2005 Document Revised: 11/01/2013 Document Reviewed: 07/06/2013 [facility] Patient Information 2015 [place], [place]. This information is not intended to replace advice given to you by your health care provider. Make sure you discuss any questions you have with your health care provider.   Emergency Department Resource Guide 1) Find a Doctor and Pay  Out of Pocket Although you won't have to find out who is covered by your insurance plan, it is a good idea to ask around and get recommendations. You will then need to call the office and see if the doctor you have chosen will accept you as a new patient and what types of options they offer for patients who are self-pay. Some doctors offer discounts or will set up payment plans  for their patients who do not have insurance, but you will need to ask so you aren't surprised when you get to your appointment.  2) Contact Your Local Health Department Not all health departments have doctors that can see patients for sick visits, but many do, so it is worth a call to see if yours does. If you don't know where your local health department is, you can check in your phone book. The CDC also has a tool to help you locate your state's health department, and many state websites also have listings of all of their local health departments.  3) Find a Walk-in Clinic If your illness is not likely to be very severe or complicated, you may want to try a walk in clinic. These are popping up all over the country in pharmacies, drugstores, and shopping centers. They're usually staffed by nurse practitioners or physician assistants that have been trained to treat common illnesses and complaints. They're usually fairly quick and inexpensive. However, if you have serious medical issues or chronic medical problems, these are probably not your best option.  No Primary Care Doctor: - Call Health Connect at  [phone] - they can help you locate a primary care doctor that  accepts your insurance, provides certain services, etc. - Physician Referral Service- [phone]  Chronic Pain Problems: Organization         Address  Phone   Notes  [facility]  [phone] Patients need to be referred by their primary care doctor.   Medication Assistance: Organization         Address  Phone   Notes  [facility] Medication [facility] [address] [place], [place] [address] [phone] --Must be a resident of [facility] [place] -- Must have NO insurance coverage whatsoever (no Medicaid/ Medicare, etc.) -- The pt. MUST have a primary care doctor that directs their care regularly and follows them in the community   Med[facility]ist  [phone]   [employer]  [phone])  [phone]    Agencies that provide inexpensive medical care: Organization         Address  Phone   Notes  [facility]  [phone]   [facility]    [phone]) [phone]   [facility] [address] [place], [place] [address] [phone]) [phone]   [facility] [address], [place] [phone]   [facility]    [phone]) [phone]   [facility]    [phone](336) [phone]   [facility]  [address], [place] Phone:  [phone], Fax:  [phone]) [phone] Hours of Operation:  9 am - 6 pm, M-F.  Also accepts Medicaid/Medicare and self-pay.  [facility]  [address], [place] Phone: [phone]) [phone], Fax: [phone]. Hours of Operation:  8:30 am - 5:30 pm, M-F.  Also accepts Medicaid and self-pay.  [facility] [place] [address], [place] Phone: [phone]) [phone]  [facility] [address] Natasha Bence [place], [place] [phone], Ext. [phone] Mondays & Thursdays: 7-9 AM.  First 15 patients are seen on a first come, first serve basis.    Medicaid-accepting [facility] Providers:  Organization         Address  Phone   Notes  [facility] [address], [place] [phone] Also accepts self-pay patients.  [facility] [address] Laurell Josephs [place], [place]  [phone]   [facility] [address], [place] [phone]   [facility] [address], [place] [phone]   Renaye Rakers [address], [place]   [phone] Only accepts [place] patients after they have their name applied to their card.   Self-Pay (no insurance) in [facility]:  Organization         Address  Phone   Notes  Sickle Cell Patients, [facility] [address] [place], [place] [phone]   [facility] [address] [place], [place] [phone]   [facility]  [address] [place] [address], [place] [phone]   [facility]/Dr. Osei-Bonsu  [address] [place] or [address], [place] [phone] Phone number for both [place] and [place] locations is the same.  [facility] and [facility] [address], [place] [phone]   [facility] [address] [place] or [address] [phone]   [facility] [address], [place] [phone], phone; [phone], fax Sees patients 1st and 3rd Saturday of every month.  Must not qualify for public or private insurance (i.e. Medicaid, Medicare, [place] Health Choice, Veterans' Benefits)  Household income should be no more than 200% of the poverty level The clinic cannot treat you if you are pregnant or think you are pregnant  Sexually transmitted diseases are not treated at the clinic.    Dental Care: Organization         Address  Phone  Notes  [facility] [address] [place], [place] [phone] Accepts children up to age 25 who are enrolled in [place] or [place] Health Choice; pregnant women with a Medicaid card; and children who have applied for Medicaid or [place] Health Choice, but were declined, whose parents can pay a reduced fee at time of service.  [facility]  [address], [place] [phone] Accepts children up to age 31 who are enrolled in [place] or [place] Health Choice; pregnant women with a Medicaid card; and children who have applied for Medicaid or [place] Health Choice, but were declined, whose parents can pay a reduced fee at time of service.  [facility]  [address] [place], [place] [phone] Patients are  seen by appointment only. Walk-ins are not accepted. [facility] will see patients 92 years of age and older. Monday - Tuesday (8am-5pm) Most Wednesdays (8:30-5pm) $30 per visit, cash only  [facility]  [address], [facility] [phone] Patients are seen by appointment only. Walk-ins are not accepted. [facility] will see patients 46 years of age and older. One Wednesday Evening (Monthly: Volunteer Based).  $30 per visit,  cash only  [employer] of Dentistry Clinics  [phone] for adults; Children under age 80, call Graduate Pediatric Dentistry at [phone]. Children aged 67-14, please call [phone] to request a pediatric application.  Dental services are provided in all areas of dental care including fillings, crowns and bridges, complete and partial dentures, implants, gum treatment, root canals, and extractions. Preventive care is also provided. Treatment is provided to both adults and children. Patients are selected via a lottery and there is often a waiting list.   [facility] [address] [place]  [phone] [email or web address]   [facility] [address] [place], [place] [phone], Ext. [phone] Second and Fourth Thursday of each month, opens at 6:30 AM; Clinic ends at 9 AM.  Patients are seen on a first-come first-served basis, and a limited number are seen during each clinic.   [facility]  [address] Ether Griffins [place], [place] [phone]   Eligibility Requirements You must have lived in [place], [place], or [place] counties for at least the last three months.   You cannot be eligible for state or federal sponsored [place], including [employer], [place], or [employer].   You generally cannot be eligible for healthcare insurance through your employer.    How to apply: Eligibility screenings are held every Tuesday and Wednesday afternoon from 1:00 pm until 4:00  pm. You do not need an appointment for the interview!  [facility] [address], [place], [place] [phone]   [facility] Health Department  [phone]   [facility] Health Department  [phone]   [facility] Health Department  [phone]    Behavioral Health Resources in the Community: Intensive Outpatient Programs Organization         Address  Phone  Notes  [facility] Services [address], [place], [place] [phone]   [facility] [address] [place], [place] [phone]   [facility] [address] [place], [place]  [phone]   [facility] [address],  [place], [place] [phone] or [phone]   Substance Abuse Resources Organization         Address  Phone  Notes  [facility]  [phone]   [facility]  [phone]   [facility]  [phone]   Floydene Flock  [phone]   Residential & Outpatient Substance Abuse Program  [phone]   Psychological Services Organization         Address  Phone  Notes  [facility]  [phone]   [facility]  [phone]   [facility] [address] [employer] [phone] or [phone]    Mobile Crisis Teams Organization         Address  Phone  Notes  [facility], Mobile Crisis Care Unit  [phone]   [facility]  [address] [place], [place] [phone]   Doristine Locks [address] [place] [phone]    Self-Help/Support Groups Organization         Address  Phone             Notes  [facility] [place] - variety of support groups  336- I7437963 Call for more information  Narcotics Anonymous (NA), [facility] [address], [place]  2 meetings at this location   Statistician  Address  Phone  Notes  °[facility] [address],    °[place]  [phone]   °[facility] ° [address], [place], [place] [phone]   °[facility] [address], [place] [phone] Admissions: 8am-3pm M-F  °[facility] [address]    °[place], [place] [phone]   °[facility] [address], [place], [place] [phone]   °[facility] [address]  °[place], [place] [phone]   °[facility] - Intensive Outpatient [address], [place], [place] [phone]   °[facility] ([facility] [address]  °[place], [place] 1-877-615-2722 or [phone]   °[facility] ([facility]) [address] [place], [place] [phone] Accepts Medicaid  °[facility] [address]  °[place] 1-800-659-3381 Substance Abuse/Addiction Treatment  ° °[place] Behavioral Health Resources °Organization         Address  Phone  Notes  °[facility]  (888) [phone]   °Julie Brannon, PhD [address] [place], [place]   ([phone]) [phone] or (336) [phone]   °[facility]   [address] °[place], [place] ([phone]) [phone]   °[facility] [address], [place], [place] ([phone]) [phone] Insurance/Medicaid/sponsorship through [facility]  °[facility] [address]                                    [place], [place] ([phone]) [phone] Therapy/tele-psych/case  °[facility] [address]  ° [place],  ([phone]) [phone]    °Dr. Arfeen  ([phone]) [phone]   °[facility] [place]  [employer] [place] [facility] 1) [address], [place] °2) [address], [place] °3)  [address], [place] (336) [phone] °(336) [phone] ° °(336) [phone]   °[place] Child Abuse Hotline (336) 342-1394 or (336) [phone] (After Hours)    ° ° ° °

## 2014-05-15 NOTE — ED Notes (Signed)
Pt refused Vicodin, states if he fill it, he will not be able to fill his regular pain med percocet.

## 2014-05-18 NOTE — ED Provider Notes (Signed)
  This was a shared visit with a mid-level provided (NP or PA).  Throughout the patient's course I was available for consultation/collaboration.  I saw the ECG (if appropriate), relevant labs and studies - I agree with the interpretation.  On my exam the patient was in no distress.  Patient has chronic abd pain, and stated that he ran out of his meds. However, with his description of unusual pain / severity, CT scan was performed.        Gerhard Munch, MD 05/18/14 1024

## 2014-06-10 ENCOUNTER — Ambulatory Visit: Payer: Self-pay | Admitting: Oncology

## 2014-07-11 ENCOUNTER — Ambulatory Visit: Payer: Self-pay | Admitting: Oncology

## 2014-08-10 ENCOUNTER — Ambulatory Visit: Payer: Self-pay | Admitting: Oncology

## 2014-09-10 ENCOUNTER — Ambulatory Visit: Payer: Self-pay | Admitting: Oncology

## 2014-09-26 ENCOUNTER — Other Ambulatory Visit: Payer: Self-pay | Admitting: Psychiatry

## 2014-09-26 DIAGNOSIS — G35 Multiple sclerosis: Secondary | ICD-10-CM

## 2014-10-04 ENCOUNTER — Ambulatory Visit
Admission: RE | Admit: 2014-10-04 | Discharge: 2014-10-04 | Disposition: A | Discharge status: Home / Self Care | Payer: Medicare Other | Source: Ambulatory Visit | Attending: Psychiatry | Admitting: Psychiatry

## 2014-10-04 ENCOUNTER — Other Ambulatory Visit: Payer: Self-pay | Admitting: Psychiatry

## 2014-10-04 DIAGNOSIS — G35 Multiple sclerosis: Secondary | ICD-10-CM

## 2014-10-10 ENCOUNTER — Ambulatory Visit: Payer: Self-pay | Admitting: Oncology

## 2014-10-13 ENCOUNTER — Emergency Department (HOSPITAL_COMMUNITY)
Admission: EM | Admit: 2014-10-13 | Discharge: 2014-10-13 | Disposition: A | Discharge status: Home / Self Care | Payer: Medicare Other | Attending: Emergency Medicine | Admitting: Emergency Medicine

## 2014-10-13 ENCOUNTER — Encounter (HOSPITAL_COMMUNITY): Payer: Self-pay | Admitting: Emergency Medicine

## 2014-10-13 DIAGNOSIS — L089 Local infection of the skin and subcutaneous tissue, unspecified: Secondary | ICD-10-CM | POA: Diagnosis not present

## 2014-10-13 DIAGNOSIS — Z7901 Long term (current) use of anticoagulants: Secondary | ICD-10-CM | POA: Insufficient documentation

## 2014-10-13 DIAGNOSIS — I10 Essential (primary) hypertension: Secondary | ICD-10-CM | POA: Diagnosis not present

## 2014-10-13 DIAGNOSIS — Z86711 Personal history of pulmonary embolism: Secondary | ICD-10-CM | POA: Insufficient documentation

## 2014-10-13 DIAGNOSIS — R220 Localized swelling, mass and lump, head: Secondary | ICD-10-CM | POA: Diagnosis present

## 2014-10-13 DIAGNOSIS — Z8639 Personal history of other endocrine, nutritional and metabolic disease: Secondary | ICD-10-CM | POA: Diagnosis not present

## 2014-10-13 DIAGNOSIS — Z79899 Other long term (current) drug therapy: Secondary | ICD-10-CM | POA: Diagnosis not present

## 2014-10-13 DIAGNOSIS — G4700 Insomnia, unspecified: Secondary | ICD-10-CM | POA: Insufficient documentation

## 2014-10-13 DIAGNOSIS — Z720 Tobacco use: Secondary | ICD-10-CM | POA: Diagnosis not present

## 2014-10-13 DIAGNOSIS — G894 Chronic pain syndrome: Secondary | ICD-10-CM | POA: Insufficient documentation

## 2014-10-13 DIAGNOSIS — F329 Major depressive disorder, single episode, unspecified: Secondary | ICD-10-CM | POA: Diagnosis not present

## 2014-10-13 DIAGNOSIS — Z86718 Personal history of other venous thrombosis and embolism: Secondary | ICD-10-CM | POA: Insufficient documentation

## 2014-10-13 DIAGNOSIS — F419 Anxiety disorder, unspecified: Secondary | ICD-10-CM | POA: Diagnosis not present

## 2014-10-13 DIAGNOSIS — Z8719 Personal history of other diseases of the digestive system: Secondary | ICD-10-CM | POA: Insufficient documentation

## 2014-10-13 MED ORDER — CLINDAMYCIN HCL 300 MG PO CAPS: 300.0000 mg | ORAL_CAPSULE | Freq: Four times a day (QID) | ORAL | Status: DC

## 2014-10-13 NOTE — Discharge Instructions (Signed)

## 2014-10-13 NOTE — ED Notes (Signed)
Pt states that he used facial hair dying kit 2 days ago and yesterday started having reaction to it on his neck area with burning.

## 2014-10-13 NOTE — ED Provider Notes (Signed)
CSN: [id number]     Arrival date & time 10/13/14  1019 History   First MD Initiated Contact with Patient 10/13/14 1030     Chief Complaint  Patient presents with  . Allergic Reaction     (Consider location/radiation/quality/duration/timing/severity/associated sxs/prior Treatment) Patient is a 53 y.o. male presenting with allergic reaction. The history is provided by the patient. No language interpreter was used.  Allergic Reaction Presenting symptoms: swelling   Severity:  Moderate Prior allergic episodes:  No prior episodes Context: chemicals   Relieved by:  Nothing Worsened by:  Nothing tried Ineffective treatments:  None tried Pt complains of swelling to his face  Past Medical History  Diagnosis Date  . Gallstones 02/17/2009  . Multiple sclerosis   . Pulmonary embolism     recurrent  . Chronic pain syndrome 01/25/2008  . Insomnia 11/06/2008  . Depression   . HTN (hypertension)   . Anxiety   . Other specified visual disturbances   . Pain in limb   . Nonspecific elevation of levels of transaminase or lactic acid dehydrogenase (LDH)   . Trigeminal neuralgia   . Other pulmonary embolism and infarction   . Abdominal pain, unspecified site   . Headache(784.0)   . Benign paroxysmal positional vertigo   . Other syndromes affecting cervical region   . Other B-complex deficiencies   . Memory loss   . Pain in limb   . Gait abnormality   . DVT (deep venous thrombosis)    Past Surgical History  Procedure Laterality Date  . Cholecystectomy  02/20/2009  . Portacath placement N/A 03/27/2014    Procedure: INSERTION PORT-A-CATH;  Surgeon: Ernestene MentionHaywood M Ingram, MD;  [facility];  Service: General;  Laterality: N/A;   Family History  Problem Relation Age of Onset  . Cancer Father   . Diabetes Mother    History  Substance Use Topics  . Smoking status: Current Every Day Smoker -- 0.50 packs/day for 20 years    Types: Cigarettes  . Smokeless tobacco: Never Used  . Alcohol  Use: No    Review of Systems  Skin: Positive for wound.  All other systems reviewed and are negative.     Allergies  Review of patient's allergies indicates no known allergies.  Home Medications   Prior to Admission medications   Medication Sig Start Date End Date Taking? Authorizing Provider  albuterol (PROVENTIL HFA;VENTOLIN HFA) 108 (90 BASE) MCG/ACT inhaler Inhale 2 puffs into the lungs 2 (two) times daily as needed for shortness of breath.     Historical Provider, MD  baclofen (LIORESAL) 20 MG tablet Take 20 mg by mouth 4 (four) times daily.     Historical Provider, MD  clindamycin (CLEOCIN) 300 MG capsule Take 1 capsule (300 mg total) by mouth 4 (four) times daily. 10/13/14   Elson AreasLeslie K Sofia, PA-C  dalfampridine (AMPYRA) 10 MG TB12 Take 10 mg by mouth 2 (two) times daily.    Historical Provider, MD  Eszopiclone (ESZOPICLONE) 3 MG TABS Take 3 mg by mouth at bedtime. Take immediately before bedtime    Historical Provider, MD  gabapentin (NEURONTIN) 600 MG tablet Take 600 mg by mouth 4 (four) times daily.     Historical Provider, MD  lisinopril-hydrochlorothiazide (PRINZIDE,ZESTORETIC) 10-12.5 MG per tablet Take 1 tablet by mouth daily.    Historical Provider, MD  methocarbamol (ROBAXIN) 750 MG tablet Take 1,500 mg by mouth 3 (three) times daily as needed for muscle spasms.  02/27/14   Historical Provider, MD  natalizumab (TYSABRI) 300 MG/15ML injection Inject into the vein every 30 (thirty) days.     Historical Provider, MD  Oxcarbazepine (TRILEPTAL) 300 MG tablet Take 300 mg by mouth 3 (three) times daily.    Historical Provider, MD  OxyCODONE (OXYCONTIN) 10 mg T12A 12 hr tablet Take 10 mg by mouth 3 (three) times daily.    Historical Provider, MD  oxyCODONE-acetaminophen (PERCOCET) 10-325 MG per tablet Take 1 tablet by mouth 4 (four) times daily as needed for pain.    Historical Provider, MD  sertraline (ZOLOFT) 50 MG tablet Take 50 mg by mouth daily.     Historical Provider, MD   verapamil (CALAN) 80 MG tablet Take 80 mg by mouth 2 (two) times daily.    Historical Provider, MD  warfarin (COUMADIN) 5 MG tablet Take 5 mg by mouth every evening.     Historical Provider, MD   BP 93/59 mmHg  Pulse 76  Temp(Src) 98 F (36.7 C) (Oral)  Resp 18  SpO2 96% Physical Exam  Constitutional: He appears well-developed and well-nourished.  HENT:  Head: Normocephalic and atraumatic.  Swollen area right face,  1.5 cm area, warm to touch,  Neck: Normal range of motion.  Cardiovascular: Normal rate.   Pulmonary/Chest: Effort normal.  Musculoskeletal: Normal range of motion.  Neurological: He is alert.  Skin: Skin is warm.  Psychiatric: He has a normal mood and affect.  Nursing note and vitals reviewed.   ED Course  Procedures (including critical care time) Labs Review Labs Reviewed - No data to display  Imaging Review No results found.   EKG Interpretation None      MDM  Looks like possible ingrown hair of folliculitis.   I will cover with antibiotics.   Pt given Rx for clindamycin.   Pt advised wasrm compresses.  Pt advised to see his MD for recheck in 3 days   Final diagnoses:  Skin infection       Elson Areas, PA-C 10/13/14 1108  Tilden Fossa, MD 10/13/14 [phone]

## 2014-11-04 ENCOUNTER — Emergency Department (HOSPITAL_COMMUNITY)
Admission: EM | Admit: 2014-11-04 | Discharge: 2014-11-05 | Disposition: A | Discharge status: Home / Self Care | Payer: Medicare Other | Attending: Emergency Medicine | Admitting: Emergency Medicine

## 2014-11-04 ENCOUNTER — Encounter (HOSPITAL_COMMUNITY): Payer: Self-pay | Admitting: Emergency Medicine

## 2014-11-04 DIAGNOSIS — F329 Major depressive disorder, single episode, unspecified: Secondary | ICD-10-CM | POA: Diagnosis not present

## 2014-11-04 DIAGNOSIS — I10 Essential (primary) hypertension: Secondary | ICD-10-CM | POA: Diagnosis not present

## 2014-11-04 DIAGNOSIS — Z86711 Personal history of pulmonary embolism: Secondary | ICD-10-CM | POA: Diagnosis not present

## 2014-11-04 DIAGNOSIS — F419 Anxiety disorder, unspecified: Secondary | ICD-10-CM | POA: Diagnosis not present

## 2014-11-04 DIAGNOSIS — Z8719 Personal history of other diseases of the digestive system: Secondary | ICD-10-CM | POA: Diagnosis not present

## 2014-11-04 DIAGNOSIS — G500 Trigeminal neuralgia: Secondary | ICD-10-CM | POA: Diagnosis not present

## 2014-11-04 DIAGNOSIS — G44029 Chronic cluster headache, not intractable: Secondary | ICD-10-CM | POA: Diagnosis not present

## 2014-11-04 DIAGNOSIS — Z720 Tobacco use: Secondary | ICD-10-CM | POA: Diagnosis not present

## 2014-11-04 DIAGNOSIS — Z86718 Personal history of other venous thrombosis and embolism: Secondary | ICD-10-CM | POA: Insufficient documentation

## 2014-11-04 DIAGNOSIS — G4700 Insomnia, unspecified: Secondary | ICD-10-CM | POA: Insufficient documentation

## 2014-11-04 DIAGNOSIS — Z7901 Long term (current) use of anticoagulants: Secondary | ICD-10-CM | POA: Insufficient documentation

## 2014-11-04 DIAGNOSIS — Z791 Long term (current) use of non-steroidal anti-inflammatories (NSAID): Secondary | ICD-10-CM | POA: Diagnosis not present

## 2014-11-04 DIAGNOSIS — Z8639 Personal history of other endocrine, nutritional and metabolic disease: Secondary | ICD-10-CM | POA: Insufficient documentation

## 2014-11-04 DIAGNOSIS — R51 Headache: Secondary | ICD-10-CM | POA: Diagnosis present

## 2014-11-04 DIAGNOSIS — G894 Chronic pain syndrome: Secondary | ICD-10-CM | POA: Insufficient documentation

## 2014-11-04 NOTE — ED Notes (Signed)
Pt reports HA to the L side that goes down into his face since 0300 yesterday. Pt took Percocet and relieved pain, but pain came back around 0300 this morning and became even more intense at 1800 tonight, and he has ran out of pain medication. Pt also reports blurry vision since 2100. Pt called his neurologist and was told to come here.

## 2014-11-05 DIAGNOSIS — G44029 Chronic cluster headache, not intractable: Secondary | ICD-10-CM | POA: Diagnosis not present

## 2014-11-05 MED ORDER — OXYCODONE-ACETAMINOPHEN 5-325 MG PO TABS: 2.0000 | ORAL_TABLET | Freq: Once | ORAL | Status: DC

## 2014-11-05 MED ORDER — HYDROMORPHONE HCL 1 MG/ML IJ SOLN
1.0000 mg | Freq: Once | INTRAMUSCULAR | Status: AC
  Administered 2014-11-05: 1 mg via INTRAMUSCULAR
  Filled 2014-11-05: qty 1

## 2014-11-05 NOTE — ED Notes (Signed)
Pt verbalized that he did not understand why he could not be prescribed any medication for home. Pt given information by PA and Clinical research associate, that the contract he signed with his pain management doctor prohibits this. Pt refused to sign discharge papers d/t this.

## 2014-11-05 NOTE — ED Notes (Signed)
Pt noted to be resting quietly, resp even and unlabored.

## 2014-11-05 NOTE — ED Provider Notes (Signed)
CSN: [id number]     Arrival date & time 11/04/14  2246 History   First MD Initiated Contact with Patient 11/05/14 0601     Chief Complaint  Patient presents with  . Headache    [name] is a 53 y.o. male with a history of chronic headaches, chronic pain from multiple sclerosis presents to emergency department complaining of right-sided headache since 3 AM 2 days ago. Patient reports this feels somewhat to his previous chronic headaches however he is now out of his Percocet. The patient reports he has a pain contract and he ran out of his Percocet early. He reports he is unable to get any more Percocet until 11/08/2014. Patient reports that he's had intermittent blurry vision which is typical for his headaches. Patient rates his pain at 9 out of 10 and sharp. Patient is attended no other treatment since taking his last Percocet yesterday. Patient denies fevers, chills, numbness, tingling, weakness, neck stiffness, abdominal pain, facial swelling, nausea, vomiting, difficulty with speech or coordination.  (Consider location/radiation/quality/duration/timing/severity/associated sxs/prior Treatment) HPI  Past Medical History  Diagnosis Date  . Gallstones 02/17/2009  . Multiple sclerosis   . Pulmonary embolism     recurrent  . Chronic pain syndrome 01/25/2008  . Insomnia 11/06/2008  . Depression   . HTN (hypertension)   . Anxiety   . Other specified visual disturbances   . Pain in limb   . Nonspecific elevation of levels of transaminase or lactic acid dehydrogenase (LDH)   . Trigeminal neuralgia   . Other pulmonary embolism and infarction   . Abdominal pain, unspecified site   . Headache(784.0)   . Benign paroxysmal positional vertigo   . Other syndromes affecting cervical region   . Other B-complex deficiencies   . Memory loss   . Pain in limb   . Gait abnormality   . DVT (deep venous thrombosis)    Past Surgical History  Procedure Laterality Date  . Cholecystectomy   02/20/2009  . Portacath placement N/A 03/27/2014    Procedure: INSERTION PORT-A-CATH;  Surgeon: [name], MD;  Location: [facility] OR;  Service: General;  Laterality: N/A;   Family History  Problem Relation Age of Onset  . Cancer Father   . Diabetes Mother    History  Substance Use Topics  . Smoking status: Current Every Day Smoker -- 0.50 packs/day for 20 years    Types: Cigarettes  . Smokeless tobacco: Never Used  . Alcohol Use: No    Review of Systems  Constitutional: Negative for fever, chills and appetite change.  HENT: Negative for congestion, ear pain, facial swelling, sore throat and trouble swallowing.   Eyes: Positive for visual disturbance. Negative for pain.  Respiratory: Negative for cough, shortness of breath and wheezing.   Cardiovascular: Negative for chest pain, palpitations and leg swelling.  Gastrointestinal: Negative for nausea, vomiting, abdominal pain, diarrhea and blood in stool.  Genitourinary: Negative for dysuria and hematuria.  Musculoskeletal: Negative for back pain, neck pain and neck stiffness.  Skin: Negative for rash and wound.  Neurological: Positive for headaches. Negative for dizziness, weakness, light-headedness and numbness.  All other systems reviewed and are negative.     Allergies  Review of patient's allergies indicates no known allergies.  Home Medications   Prior to Admission medications   Medication Sig Start Date End Date Taking? Authorizing Provider  albuterol (PROVENTIL HFA;VENTOLIN HFA) 108 (90 BASE) MCG/ACT inhaler Inhale 2 puffs into the lungs 2 (two) times daily as needed for  shortness of breath.    Yes Historical Provider, MD  baclofen (LIORESAL) 20 MG tablet Take 20 mg by mouth 4 (four) times daily.    Yes Historical Provider, MD  dalfampridine (AMPYRA) 10 MG TB12 Take 10 mg by mouth 2 (two) times daily.   Yes Historical Provider, MD  Eszopiclone (ESZOPICLONE) 3 MG TABS Take 3 mg by mouth at bedtime. Take immediately  before bedtime   Yes Historical Provider, MD  gabapentin (NEURONTIN) 600 MG tablet Take 600 mg by mouth 4 (four) times daily.    Yes Historical Provider, MD  lisinopril-hydrochlorothiazide (PRINZIDE,ZESTORETIC) 10-12.5 MG per tablet Take 1 tablet by mouth daily.   Yes Historical Provider, MD  methocarbamol (ROBAXIN) 750 MG tablet Take 1,500 mg by mouth 3 (three) times daily as needed for muscle spasms.  02/27/14  Yes Historical Provider, MD  Oxcarbazepine (TRILEPTAL) 300 MG tablet Take 300 mg by mouth 3 (three) times daily.   Yes Historical Provider, MD  OxyCODONE (OXYCONTIN) 10 mg T12A 12 hr tablet Take 10 mg by mouth 3 (three) times daily.   Yes Historical Provider, MD  oxyCODONE-acetaminophen (PERCOCET) 10-325 MG per tablet Take 1 tablet by mouth 4 (four) times daily as needed for pain.   Yes Historical Provider, MD  sertraline (ZOLOFT) 50 MG tablet Take 50 mg by mouth daily.    Yes Historical Provider, MD  verapamil (CALAN) 80 MG tablet Take 80 mg by mouth 2 (two) times daily.   Yes Historical Provider, MD  warfarin (COUMADIN) 5 MG tablet Take 5 mg by mouth every evening.    Yes Historical Provider, MD  clindamycin (CLEOCIN) 300 MG capsule Take 1 capsule (300 mg total) by mouth 4 (four) times daily. Patient not taking: Reported on 11/04/2014 10/13/14   Elson AreasLeslie K Sofia, PA-C  natalizumab (TYSABRI) 300 MG/15ML injection Inject into the vein every 30 (thirty) days.     Historical Provider, MD   BP 145/76 mmHg  Pulse 88  Temp(Src) 97.6 F (36.4 C) (Oral)  Resp 16  Ht 5\' 8"  (1.727 m)  Wt 198 lb (89.812 kg)  BMI 30.11 kg/m2  SpO2 97% Physical Exam  Constitutional: He is oriented to person, place, and time. He appears well-developed and well-nourished. No distress.  Nontoxic appearing.  HENT:  Head: Normocephalic and atraumatic.  Right Ear: External ear normal.  Left Ear: External ear normal.  Nose: Nose normal.  Mouth/Throat: Oropharynx is clear and moist. No oropharyngeal exudate.   Bilateral tympanic membranes are pearly-gray without erythema or loss of landmarks. No temporal edema or tenderness.   Eyes: Conjunctivae and EOM are normal. Pupils are equal, round, and reactive to light. Right eye exhibits no discharge. Left eye exhibits no discharge.  Neck: Normal range of motion. Neck supple.  Cardiovascular: Normal rate, regular rhythm, normal heart sounds and intact distal pulses.  Exam reveals no gallop and no friction rub.   No murmur heard. Pulmonary/Chest: Effort normal and breath sounds normal. No respiratory distress. He has no wheezes. He has no rales.  Abdominal: Soft. He exhibits no distension. There is no tenderness.  Musculoskeletal: Normal range of motion. He exhibits no edema.  Patient is able to ambulate in the room. Patient is spontaneously moving all extremities in a coordinated fashion exhibiting good strength.   Lymphadenopathy:    He has no cervical adenopathy.  Neurological: He is alert and oriented to person, place, and time. No cranial nerve deficit. Coordination normal.  Cranial nerves II through XII are intact bilaterally. Patient has  good sensations bilaterally. Patient's grip strengths are equal.  Skin: Skin is warm and dry. No rash noted. He is not diaphoretic. No erythema. No pallor.  Psychiatric: He has a normal mood and affect. His behavior is normal.  Nursing note and vitals reviewed.   ED Course  Procedures (including critical care time) Labs Review Labs Reviewed - No data to display  Imaging Review No results found.   EKG Interpretation None      Filed Vitals:   11/04/14 2306 11/05/14 0631  BP: 124/70 145/76  Pulse: 85 88  Temp: 97.6 F (36.4 C)   TempSrc: Oral   Resp: 16   Height: 5\' 8"  (1.727 m)   Weight: 198 lb (89.812 kg)   SpO2: 98% 97%     MDM   Meds given in ED:  Medications  HYDROmorphone (DILAUDID) injection 1 mg (1 mg Intramuscular Given 11/05/14 2841)    Discharge Medication List as of 11/05/2014   6:27 AM      Final diagnoses:  Chronic cluster headache, not intractable   Patient presented to ED with worsening chronic headache. Patient is out of his Percocet early and can not get more until 11/08/14. Patient has a pain contract is not allowed to have prescription for Percocet. Patient is afebrile and nontoxic appearing. Patient has no neurological deficits. Patient is provided a milligram of Dilaudid in the ED. Patient did not wish for any nonnarcotic pain medicine. Patient was explained that he could not go home with narcotic pain medicine as he has a pain contract and I would not be giving him more since he ran out early.  The patient was looked up, [place] controlled substance database which indicated he got a 30 day supply of Percocet on 10/11/2014. Advised patient to follow-up with his primary care provider and his pain medicine provider for continued pain. Advised patient return to the emergency department new worsening symptoms or new concerns. The patient verbalized understanding and agreement with plan.  This patient was discussed with Dr. Mora Bellman who agrees with assessment and plan.      Lawana Chambers, PA-C 11/05/14 1529  Tomasita Crumble, MD 11/06/14 1050

## 2014-11-05 NOTE — Discharge Instructions (Signed)
Cluster Headache Cluster headaches are recognized by their pattern of deep, intense head pain. They normally occur on one side of your head, but they may "switch sides" in subsequent episodes. Typically, cluster headaches:   Are severe in nature.   Occur repeatedly over weeks to months and are followed by periods of no headaches.   Can last from 15 minutes to 3 hours.   Occur at the same time each day, often at night.   Occur several times a day. CAUSES The exact cause of cluster headaches is not known. Alcohol use may be associated with cluster headaches. SIGNS AND SYMPTOMS   Severe pain that begins in or around your eye or temple.   One-sided head pain.   Feeling sick to your stomach (nauseous).   Sensitivity to light.   Runny nose.   Eye redness, tearing, and nasal stuffiness on the side of your head where you are experiencing pain.   Sweaty, pale skin of the face.   Droopy or swollen eyelid.   Restlessness. DIAGNOSIS  Cluster headaches are diagnosed based on symptoms and a physical exam. Your health care provider may order a CT scan or an MRI of your head or lab tests to see if your headaches are caused by other medical conditions.  TREATMENT   Medicines for pain relief and to prevent recurrent attacks. Some people may need a combination of medicines.  Oxygen for pain relief.   Biofeedback programs to help reduce headache pain.  It may be helpful to keep a headache diary. This may help you find a trend for what is triggering your headaches. Your health care provider can develop a treatment plan.  HOME CARE INSTRUCTIONS  During cluster periods:   Follow a regular sleep schedule. Do not vary the amount and time that you sleep from day to day. It is important to stay on the same schedule during a cluster period to help prevent headaches.   Avoid alcohol.   Stop smoking if you smoke.  SEEK MEDICAL CARE IF:  You have any changes from your previous  cluster headaches either in intensity or frequency.   You are not getting relief from medicines you are taking.  SEEK IMMEDIATE MEDICAL CARE IF:   You faint.   You have weakness or numbness, especially on one side of your body or face.   You have double vision.   You have nausea or vomiting that is not relieved within several hours.   You cannot keep your balance or have difficulty talking or walking.   You have neck pain or stiffness.   You have a fever. MAKE SURE YOU:  Understand these instructions.   Will watch your condition.   Will get help right away if you are not doing well or get worse. Document Released: 10/27/2005 Document Revised: 08/17/2013 Document Reviewed: 05/19/2013 Exit[employer] Patient Information 2015 [employer]. This information is not intended to replace advice given to you by your health care provider. Make sure you discuss any questions you have with your health care provider.  

## 2014-11-05 NOTE — ED Notes (Signed)
Bed: WA06 Expected date:  Expected time:  Means of arrival:  Comments: 

## 2014-11-10 ENCOUNTER — Ambulatory Visit: Payer: Self-pay | Admitting: Oncology

## 2014-11-19 IMAGING — CT CT ABD-PELV W/O CM
2 of 4 series · 16 of 46 positions shown, 18 images · non-contrast
Comparison: 08/02/2011

CLINICAL DATA: Right lower quadrant pain, onset yesterday.

CT ABDOMEN AND PELVIS WITHOUT CONTRAST
TECHNIQUE: Multidetector CT imaging of the abdomen and pelvis was
performed following the standard protocol without intravenous
contrast.

[Series 2: abd/ pelvis 5.0 i30f 1 · axial · 0.74mm/px · z∈[-464,+11]mm · 13 of 103 slices shown, 15 images]
[im 4/103  soft-tissue]
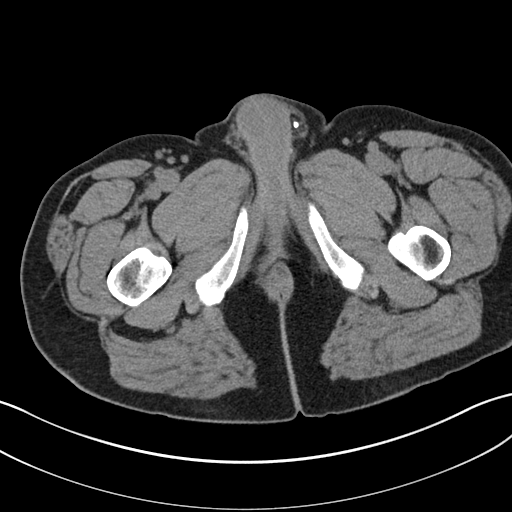
[im 4/103  bone]
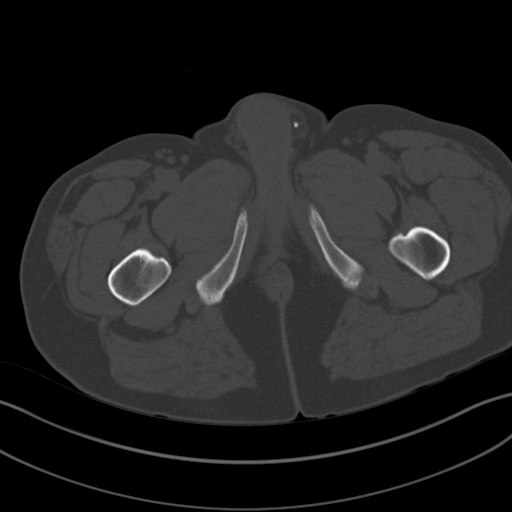
[im 12/103  soft-tissue]
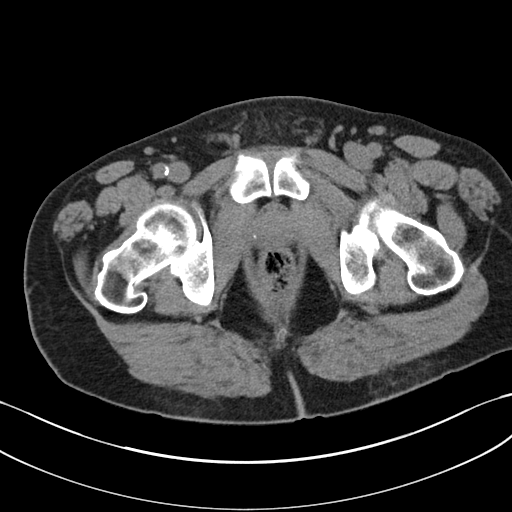
[im 20/103  soft-tissue]
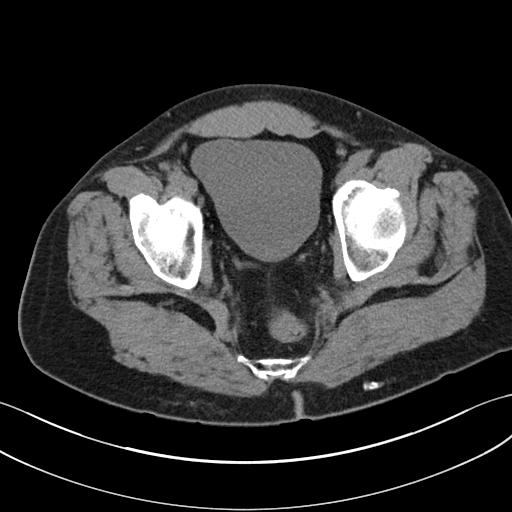
[im 28/103  soft-tissue]
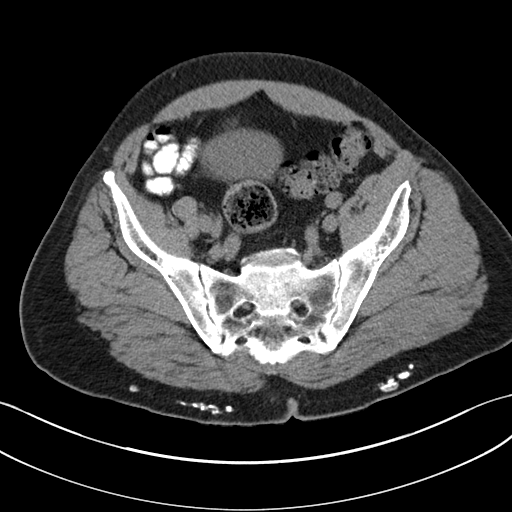
[im 36/103  soft-tissue]
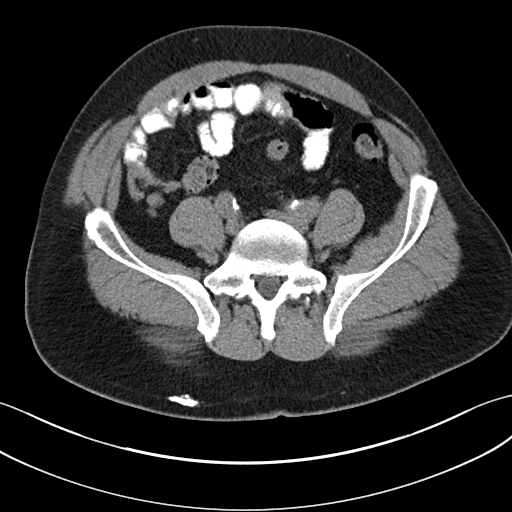
[im 44/103  soft-tissue]
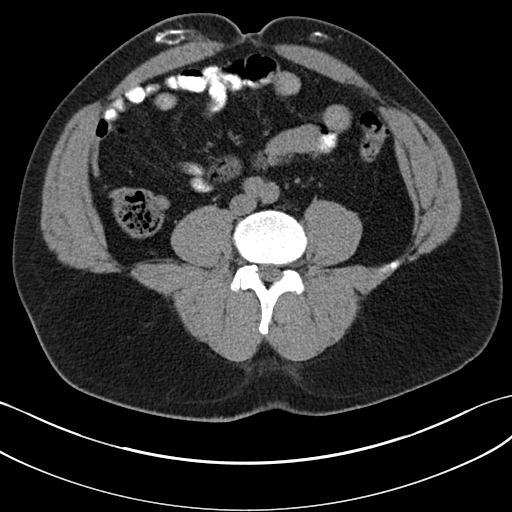
[im 52/103  soft-tissue]
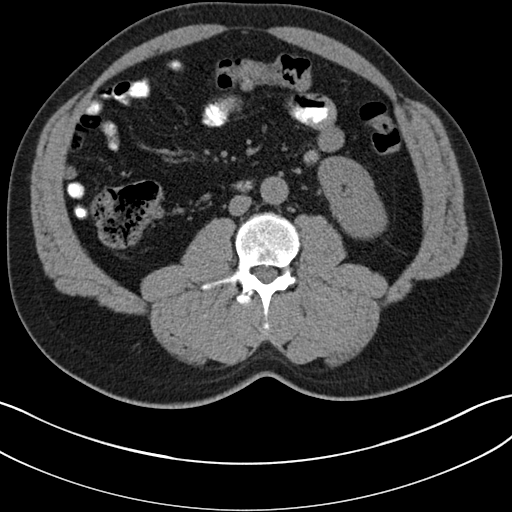
[im 59/103  soft-tissue]
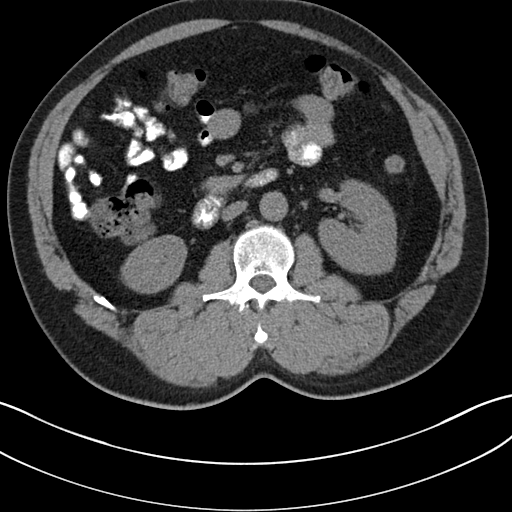
[im 67/103  soft-tissue]
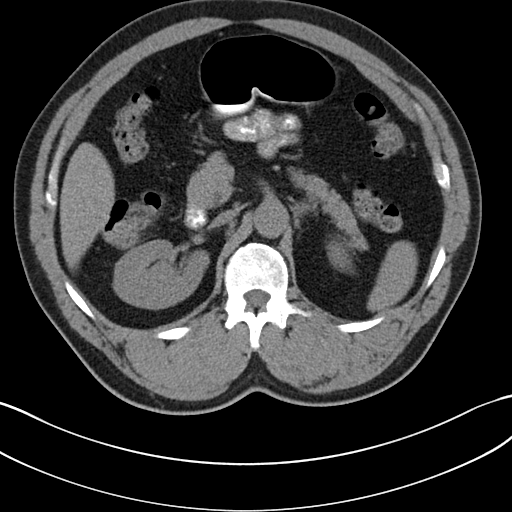
[im 67/103  bone]
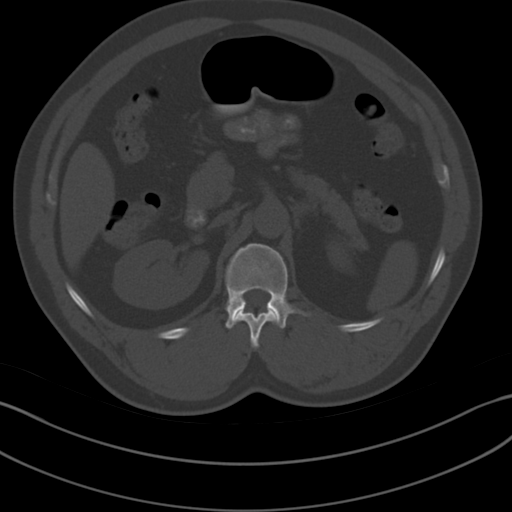
[im 75/103  soft-tissue]
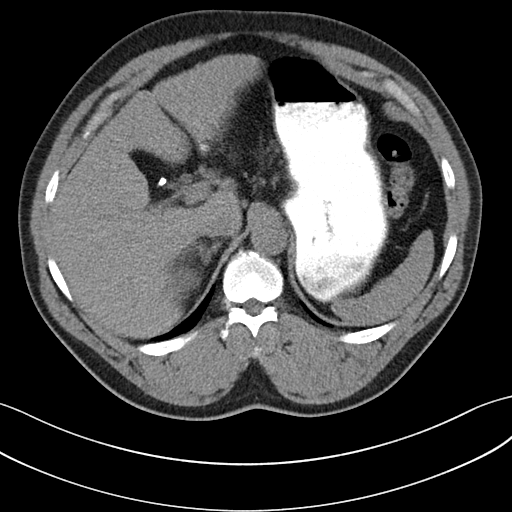
[im 83/103  soft-tissue]
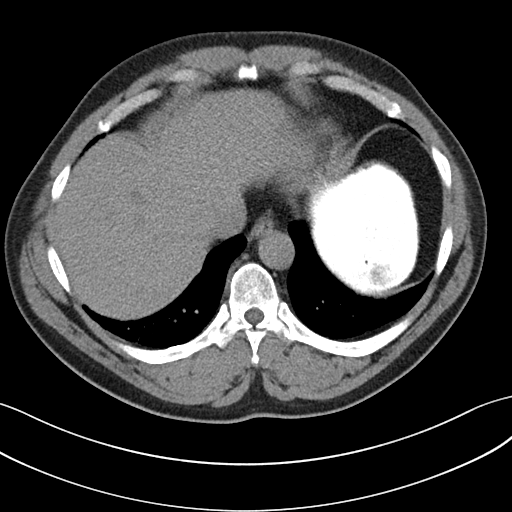
[im 91/103  soft-tissue]
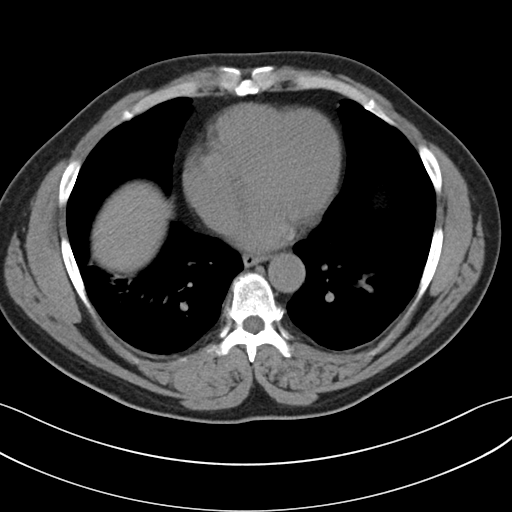
[im 99/103  soft-tissue]
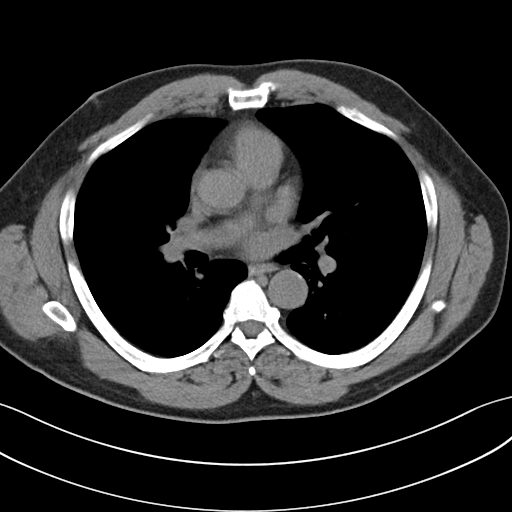

[Series 5: cor st · coronal · 0.82mm/px · 3 of 101 slices shown]
[im 34/101  soft-tissue]
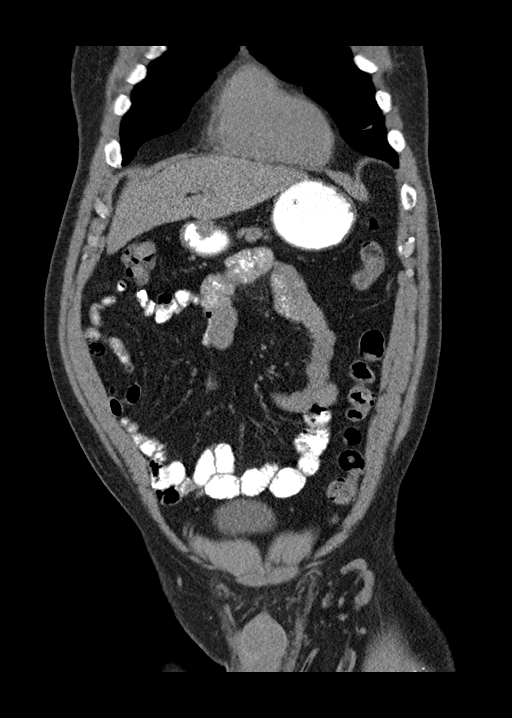
[im 45/101  soft-tissue]
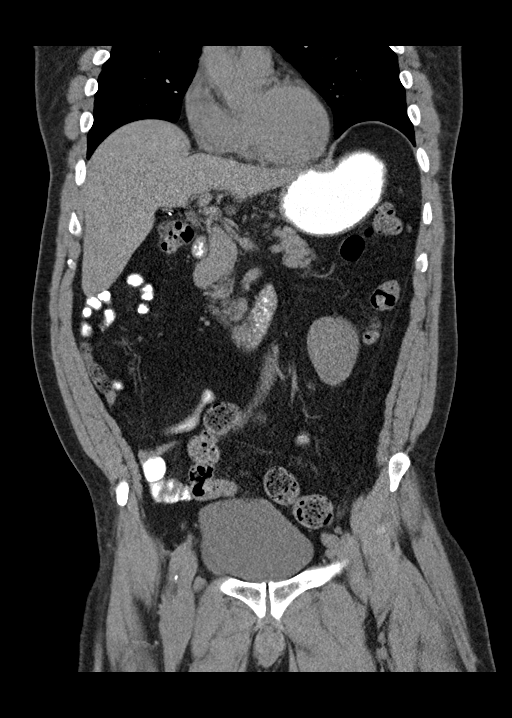
[im 56/101  soft-tissue]
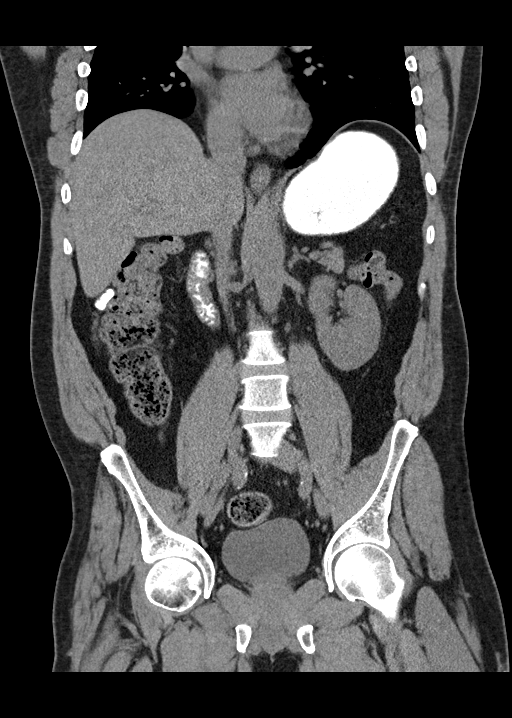

[16 of 46 positions shown; findings below may reference images not displayed]

FINDINGS: Dependent atelectasis in the lung bases.

Surgical absence of the gallbladder.  The unenhanced appearance of
the liver, spleen, pancreas, kidneys, abdominal aorta, inferior
vena cava, and retroperitoneal lymph nodes is unremarkable.  No
renal or ureteral stone.  No pyelocaliectasis or ureterectasis.
The stomach and small bowel are not abnormally distended.  No wall
thickening.  Scattered stool within the colon.  No colonic
distension.  No free air or free fluid in the abdomen.
Calcifications in the subcutaneous fat of the abdominal wall likely
represent injection granulomas.

Pelvis:  Calcification in the pelvis consistent with phleboliths.
Prostate gland is normal sized. Tiny amount of gas in the bladder
suggesting cystitis although catheterization or fistula can also
have this appearance.  The appearance is similar to the previous
study.  Stool filled rectosigmoid colon without diverticulitis.
The appendix is normal.  Normal alignment of the lumbar spine.  No
destructive bone lesions appreciated.
IMPRESSION: Normal appendix.  Tiny punctate gas collection in the bladder could
represent cystitis or iatrogenic air.  Surgical absence of the
gallbladder.

## 2014-12-11 ENCOUNTER — Ambulatory Visit: Payer: Self-pay | Admitting: Oncology

## 2015-01-01 IMAGING — CT CT ABD-PELV W/ CM
2 of 4 series · 14 of 36 positions shown, 19 images · IV contrast (READICAT/WATER & [ID] OMNI 300)
Comparison: Multiple priors, most recent 06/27/2013

CLINICAL DATA: Upper mid abdominal pain/bloating for six months.
Prior cholecystectomy.

EXAM:
CT ABDOMEN AND PELVIS WITH CONTRAST
TECHNIQUE: Multidetector CT imaging of the abdomen and pelvis was performed
using the standard protocol following bolus administration of
intravenous contrast.
CONTRAST:  125mL OMNIPAQUE IOHEXOL 300 MG/ML  SOLN

[Series 2: abd/pelvis with · axial · 0.78mm/px · z∈[-351,+39]mm · 13 of 88 slices shown, 17 images]
[im 5/88  soft-tissue]
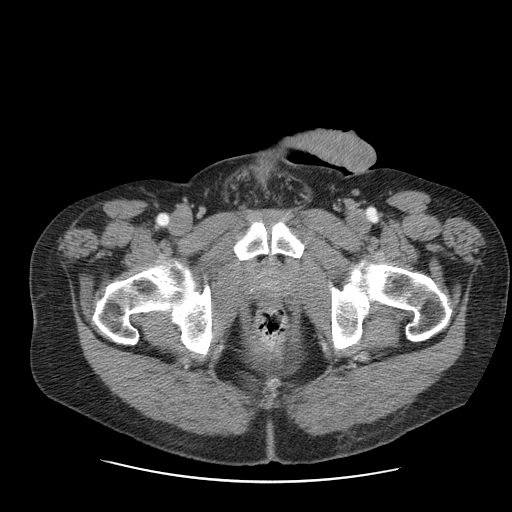
[im 5/88  bone]
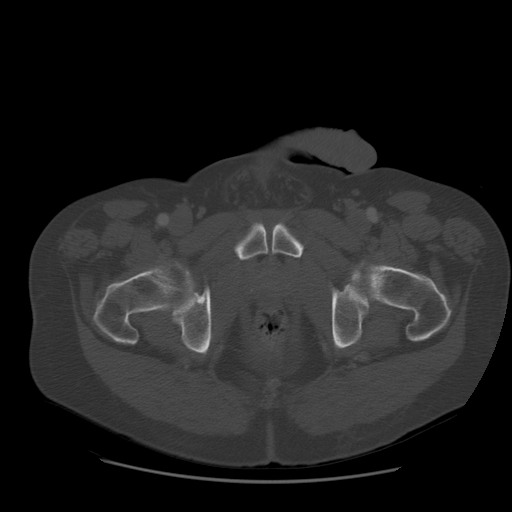
[im 13/88  soft-tissue]
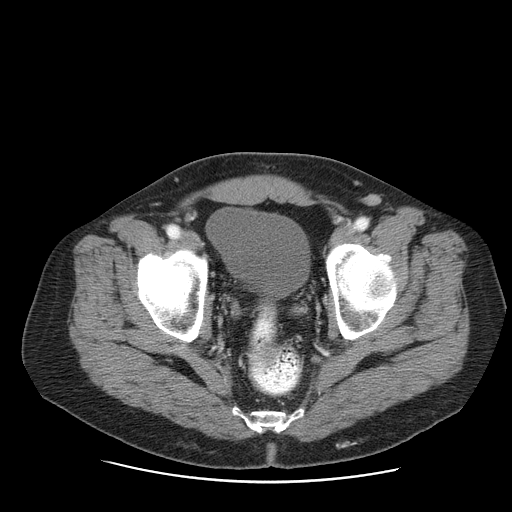
[im 21/88  soft-tissue]
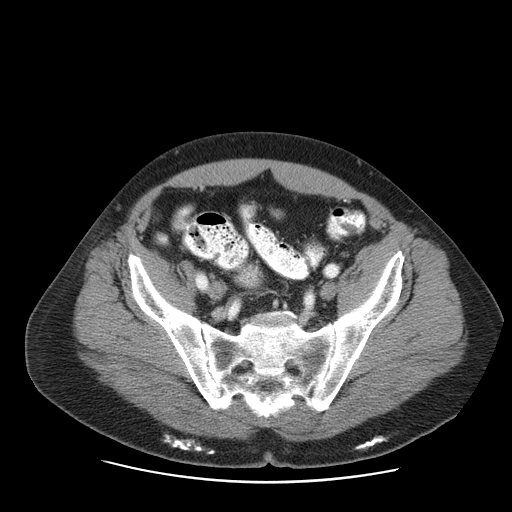
[im 30/88  soft-tissue]
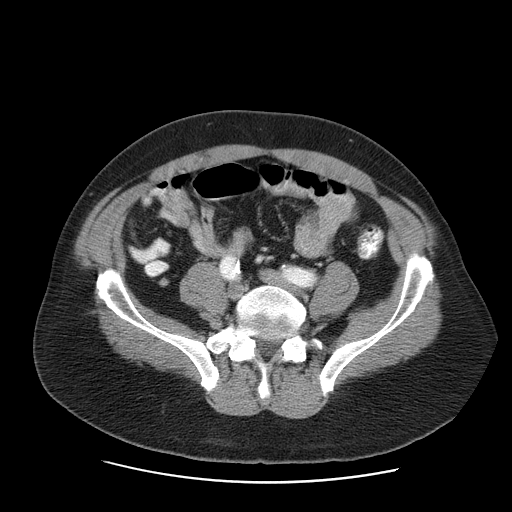
[im 38/88  soft-tissue]
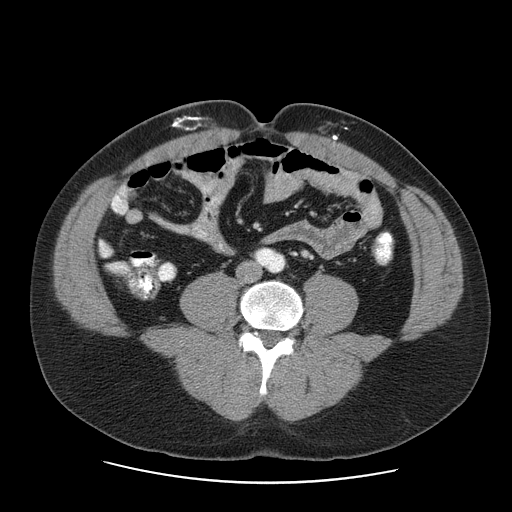
[im 46/88  soft-tissue]
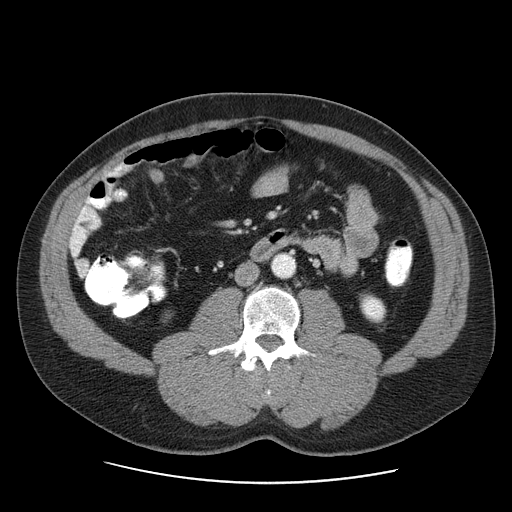
[im 50/88  soft-tissue]
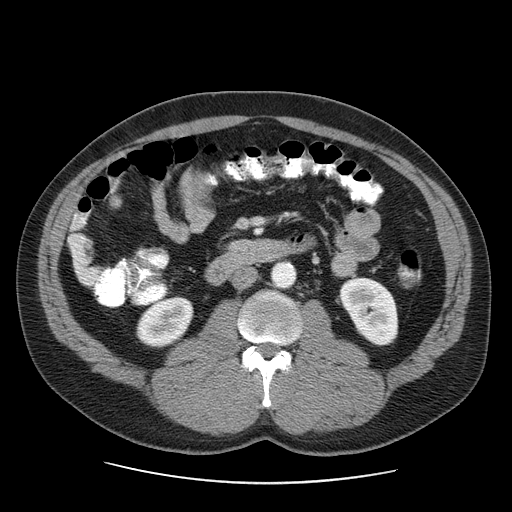
[im 59/88  soft-tissue]
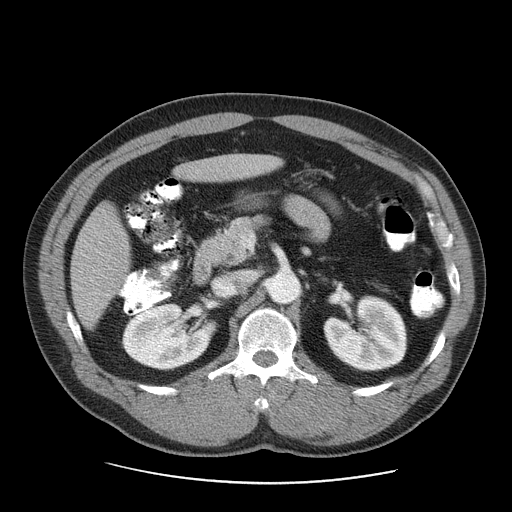
[im 67/88  soft-tissue]
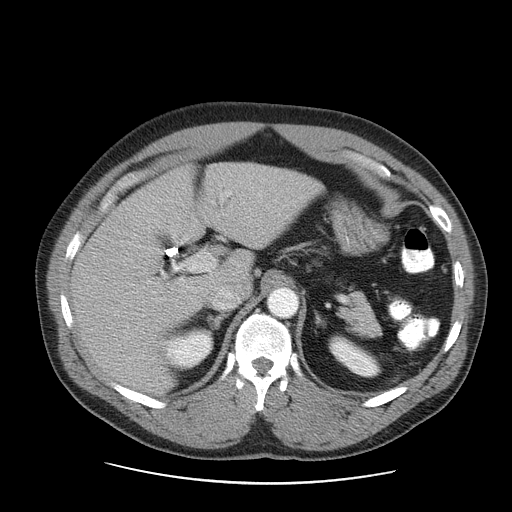
[im 67/88  bone]
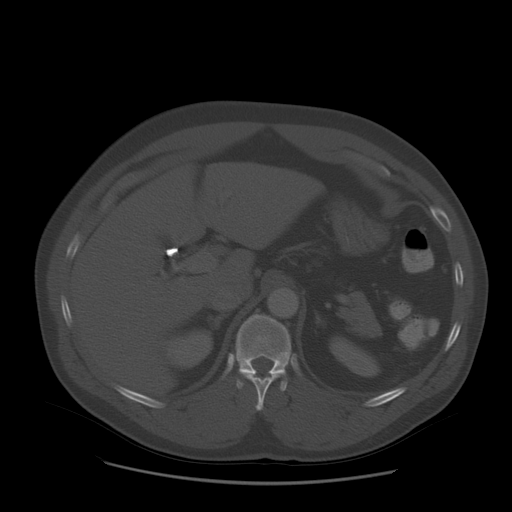
[im 71/88  lung]
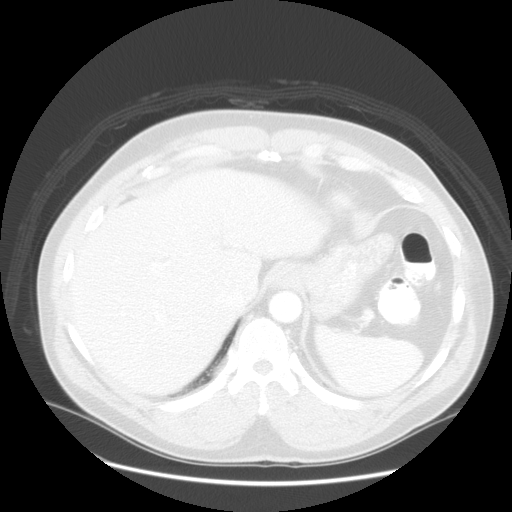
[im 75/88  soft-tissue]
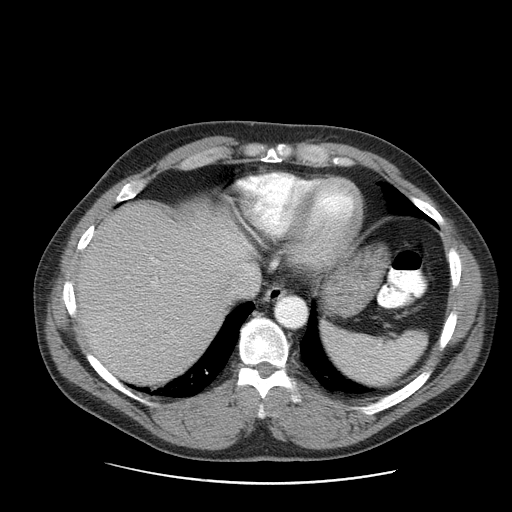
[im 75/88  lung]
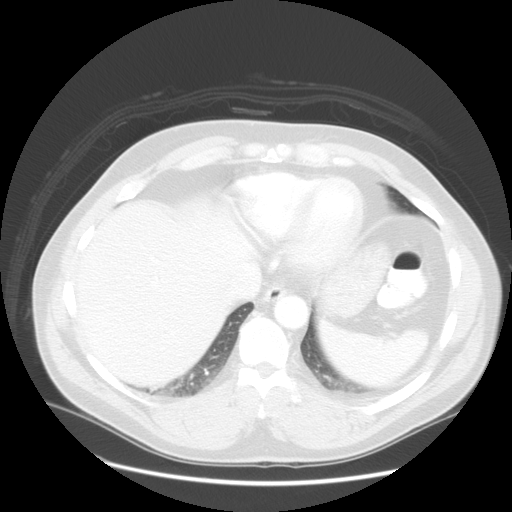
[im 79/88  lung]
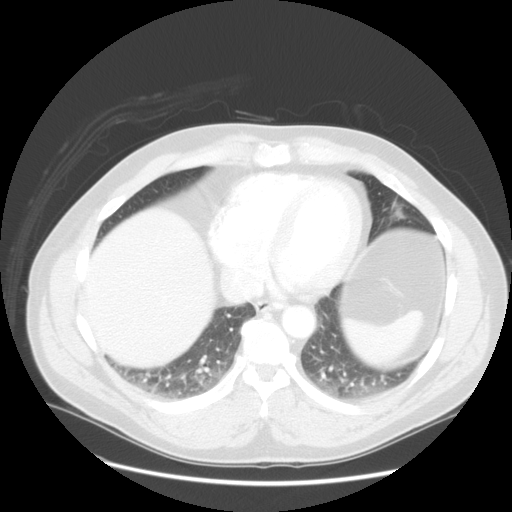
[im 83/88  soft-tissue]
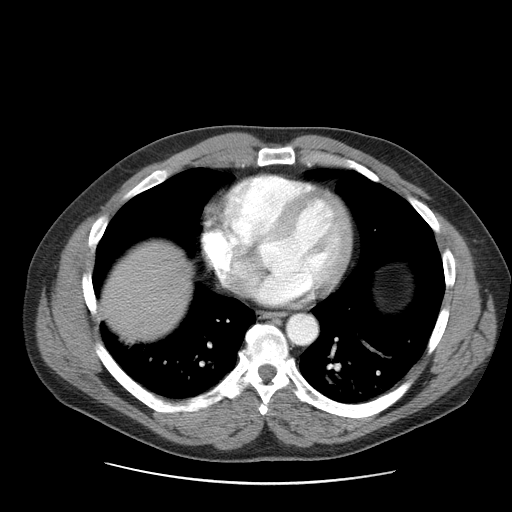
[im 83/88  lung]
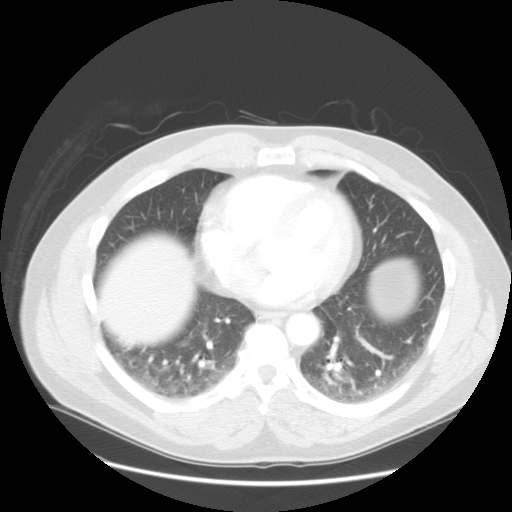

[Series 401: sagittal · sagittal · 0.97mm/px · 1 of 153 slices shown, 2 images]
[im 51/153  soft-tissue]
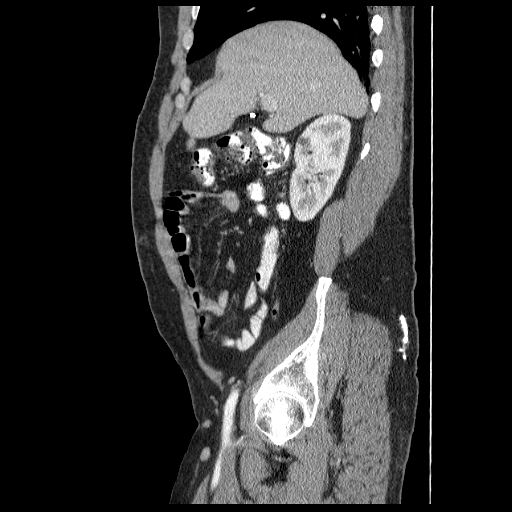
[im 51/153  bone]
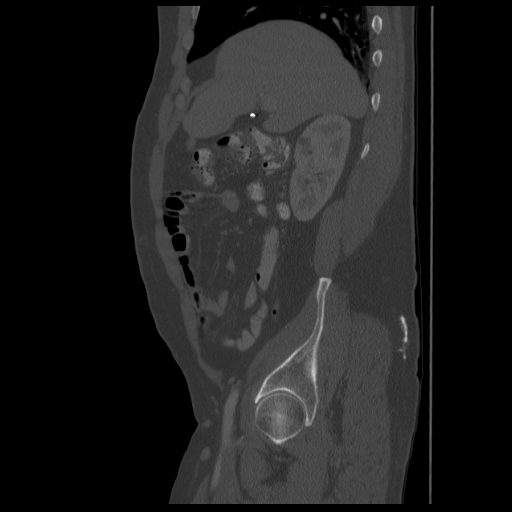

[14 of 36 positions shown; findings below may reference images not displayed]

FINDINGS: The visualized lung bases demonstrate dependent atelectasis. No
pericardial effusion.

The spleen, pancreas, and adrenals are unremarkable. Mild
intrahepatic ductal dilatation is unchanged and likely normal for
this patient status post cholecystectomy. No extrahepatic biliary
dilatation. The liver is otherwise unremarkable. Bilateral
subcentimeter hypodensities are identified in each kidney, the
largest on the right is in the upper pole and measures up to 9 mm
and the largest in the left is in the interpolar region and measures
up to 6 mm. These are too small to characterize but statistically
represent cysts. The kidneys are otherwise unremarkable without
hydronephrosis or stones. Mild intramural fat
within the anterior wall of the bladder is again noted. Otherwise,
the bladder is unremarkable.

Opacified bowel loops are normal in caliber without evidence of
obstruction. A moderate amount of stool is identified within the
colon. No evidence of diverticulitis.Normal appendix is visualized.
<No ascites, pneumoperitoneum, or lymphadenopathy is seen.

Aorta is normal in caliber.

No acute osseous abnormality or destructive osseous lesion.
IMPRESSION: No change from prior examinations. No findings to explain the
patient's abdominal pain are identified.

## 2015-01-09 ENCOUNTER — Ambulatory Visit
Admit: 2015-01-09 | Disposition: A | Discharge status: Home / Self Care | Payer: Self-pay | Attending: Oncology | Admitting: Oncology

## 2015-02-09 ENCOUNTER — Ambulatory Visit
Admit: 2015-02-09 | Disposition: A | Discharge status: Home / Self Care | Payer: Self-pay | Attending: Oncology | Admitting: Oncology

## 2015-03-03 NOTE — Consult Note (Signed)
   Note Type Consult   HPI: Referred by Dr. Tinnie Gens.   This 54 year old Male patient presents to the clinic for initial evaluation of  Tysabri infusion for multiple sclerosis.  Subjective: Chief Complaint/Diagnosis:   Tysabri infusion for multiple sclerosis. HPI:   Patient is a 54 year old male with a long-standing history of multiple sclerosis who is referred for continuation of Tysabri infusions.  Currently he feels well and is at his baseline.  He is no new neurologic complaints.  He denies any recent fevers.  His good appetite and denies weight loss.  He has no chest pain or shortness of breath.  He denies any nausea, vomiting, constipation, or diarrhea.  He has no urinary complaints.  Patient offers no specific complaints today.   Review of Systems:  Performance Status (ECOG): 1  Pain ?: No complaints (0, none)  Emotional well-being: None  Review of Systems:   As per HPI. Otherwise, 10 point system review was negative.   Allergies:  No Known Allergies:   Smoking History: Smoking History .75 Packs per day and for greater than 25 years. Smoking Cessation Information Given to Patient .  PFSH: Additional Past Medical and Surgical History: ultiple sclerosis, blood clots, cluster headaches, bladder surgery.    Family history: "Leukemia".    Social history: Tobacco as above.  No report of alcohol.   Vital Signs:  :: vital signs stable, patient afebrile.   Physical Exam:  General: well developed, well nourished, and in no acute distress.  Sitting in a wheelchair.  Mental Status: normal affect  Eyes: anicteric sclera  Head, Ears, Nose,Throat: Normocephalic, moist mucous membranes, clear oropharynx without erythema or thrush.  Neck, Thyroid: No palpable lymphadenopathy, thyroid midline without nodules.  Respiratory: clear to auscultation bilaterally  Cardiovascular: regular rate and rhythm, no murmur, rub, or gallop  Gastrointestinal: soft, nondistended, nontender, no  organomegaly.  normal active bowel sounds  Musculoskeletal: No edema  Skin: No rash or petechiae noted  Neurological: alert, answering all questions appropriately.  Cranial nerves grossly intact   Orders: Chemotherapy Orders:   1 Natalizumab Injection                    Order date: null  Cycle:1 Day:1, 300 mg, IV Piggyback, once, 115 ml/hr, 60 minute(s)  Assessment and Plan: Impression:   Tysabri infusion for multiple sclerosis. Plan:   1.  Tysabri infusion for multiple sclerosis: Patient will receive 300 mg Tysabri today.  He will return to clinic every 4 weeks for continuation of infusions.  He will then return to clinic in 6 months for routine evaluation.  Patient understands that all of his laboratory work, including JC virus, will be monitored by his primary neurologist.  He also understands that if he has any questions regarding Tysabri or his multiple sclerosis but they should be directed towards Dr. Tinnie Gens.  Patient expressed understanding and was in agreement with this plan.  Fax to Physician:  Physicians To Recieve Fax: Floydene Flock, MD - [phone] Christoper Allegra  CC Referral:  cc: Dr. Trudie Buckler   Electronic Signatures: Gerarda Fraction (MD)  (Signed 01-Apr-15 10:55)  Authored: Note Type, History of Present Illness, CC/HPI, Review of Systems, ALLERGIES, Smoking Cessation, Patient Family Social History, Vital Signs, Physical Exam, Chemo Orders, Assessment and Plan, Fax to Physician, CC Referring Physician   Last Updated: 01-Apr-15 10:55 by Gerarda Fraction (MD)

## 2015-03-03 NOTE — Consult Note (Signed)
Note Type Consult   HPI: Referred by Dr. Tinnie Gens.   This 54 year old Male patient presents to the clinic for follow up (1)Tysabri infusion for multiple sclerosis(1)  Subjective: Chief Complaint/Diagnosis:   Tysabri infusion for multiple sclerosis. HPI:   Patient returns to clinic today for routine 6 month evaluation. He continues to tolerate Tysabri infusions without side effects.  Currently, he feels well and is at his baseline.  He has no new neurologic complaints.  He denies any recent fevers.  He has a good appetite and denies weight loss.  He has no chest pain or shortness of breath.  He denies any nausea, vomiting, constipation, or diarrhea.  He has no urinary complaints.  Patient offers no specific complaints today.   Review of Systems:  Performance Status (ECOG): 1  Pain ?: No complaints (0, none)  Emotional well-being: None  Review of Systems:   As per HPI. Otherwise, 10 point system review was negative.   Allergies:  No Known Allergies:   Smoking History: Smoking History .75 Packs per day and for greater than 25 years. Smoking Cessation Information Given to Patient .  PFSH: Additional Past Medical and Surgical History: ultiple sclerosis, blood clots, cluster headaches, bladder surgery.    Family history: "Leukemia".    Social history: Tobacco as above.  No report of alcohol.   Home Medications: Medication Instructions Last Modified Date/Time  sertraline 50 mg oral tablet 1 tab(s) orally once a day 11-Nov-15 09:00  OXcarbazepine 300 mg oral tablet 1  orally 3 times a day 11-Nov-15 09:00  verapamil 80 mg oral tablet 1  orally 2 times a day 11-Nov-15 09:00  gabapentin 600 mg oral tablet 1  orally 4 times a day 11-Nov-15 09:00  baclofen 20 mg oral tablet 1  orally 4 times a day 11-Nov-15 09:00  Ampyra 10 mg oral tablet, extended release 1 tab(s) orally every 12 hours 11-Nov-15 09:00  hydrochlorothiazide-lisinopril 12.5 mg-10 mg oral tablet 1 tab(s) orally once a day  11-Nov-15 09:00  warfarin 5 mg oral tablet 1 tab(s) orally once a day 11-Nov-15 09:00  Lunesta 3 mg oral tablet 1 tab(s) orally once a day (at bedtime) 11-Nov-15 09:00  acetaminophen-oxyCODONE 325 mg-10 mg oral tablet 1 tab(s) orally every 6 hours, As Needed - for Pain 11-Nov-15 09:00   Vital Signs:  :: Temp: 97.8 Pulse: 76 RR: 16  BP: 102/55   Physical Exam:  General: well developed, well nourished, and in no acute distress.  Sitting in a wheelchair.  Mental Status: normal affect  Eyes: anicteric sclera  Respiratory: clear to auscultation bilaterally  Cardiovascular: regular rate and rhythm, no murmur, rub, or gallop  Gastrointestinal: soft, nondistended, nontender, no organomegaly.  normal active bowel sounds  Musculoskeletal: No edema  Skin: No rash or petechiae noted  Neurological: alert, answering all questions appropriately.  Cranial nerves grossly intact   Orders: Chemotherapy Orders:   1 Natalizumab Injection                    Order date: null  Cycle:1 Day:1, 300 mg, IV Piggyback, once, 115 ml/hr, 60 minute(s)  Assessment and Plan: Impression:   Tysabri infusion for multiple sclerosis. Plan:   1.  Tysabri infusion for multiple sclerosis: Patient will receive 300 mg Tysabri today.  He will return to clinic every 4 weeks for continuation of infusions.  He will then return to clinic in 6 months for routine evaluation.  Patient understands that all of his laboratory work,  including JC virus, will be monitored by his primary neurologist.  He also understands that if he has any questions regarding Tysabri or his multiple sclerosis but they should be directed towards Dr. Tinnie Gens.  Patient expressed understanding and was in agreement with this plan.  Fax to Physician:  [facility] Fax: Floydene Flock, MD - [phone] Christoper Allegra  CC Referral:  cc: Dr. Trudie Buckler   Electronic Signatures: Gerarda Fraction (MD)  (Signed [phone] 09:57)  Authored: Note Type,  History of Present Illness, CC/HPI, Review of Systems, ALLERGIES, Smoking Cessation, Patient Family Social History, HOME MEDICATIONS, Vital Signs, Physical Exam, Chemo Orders, Assessment and Plan, Fax to Physician, CC Referring Physician   Last Updated: 12-Nov-15 09:57 by Gerarda Fraction (MD)  References: 1.  Data Referenced From "[facility] Office Nurse Note" 20-Sep-2014 08:58

## 2015-03-26 IMAGING — CR DG CHEST 2V
2 series · 2 of 2 positions shown · non-contrast
Comparison: None.

CLINICAL DATA: Weakness history of ETOH use an pulmonary arterial
embolic disease.

EXAM:
CHEST  2 VIEW

[w chest pa]
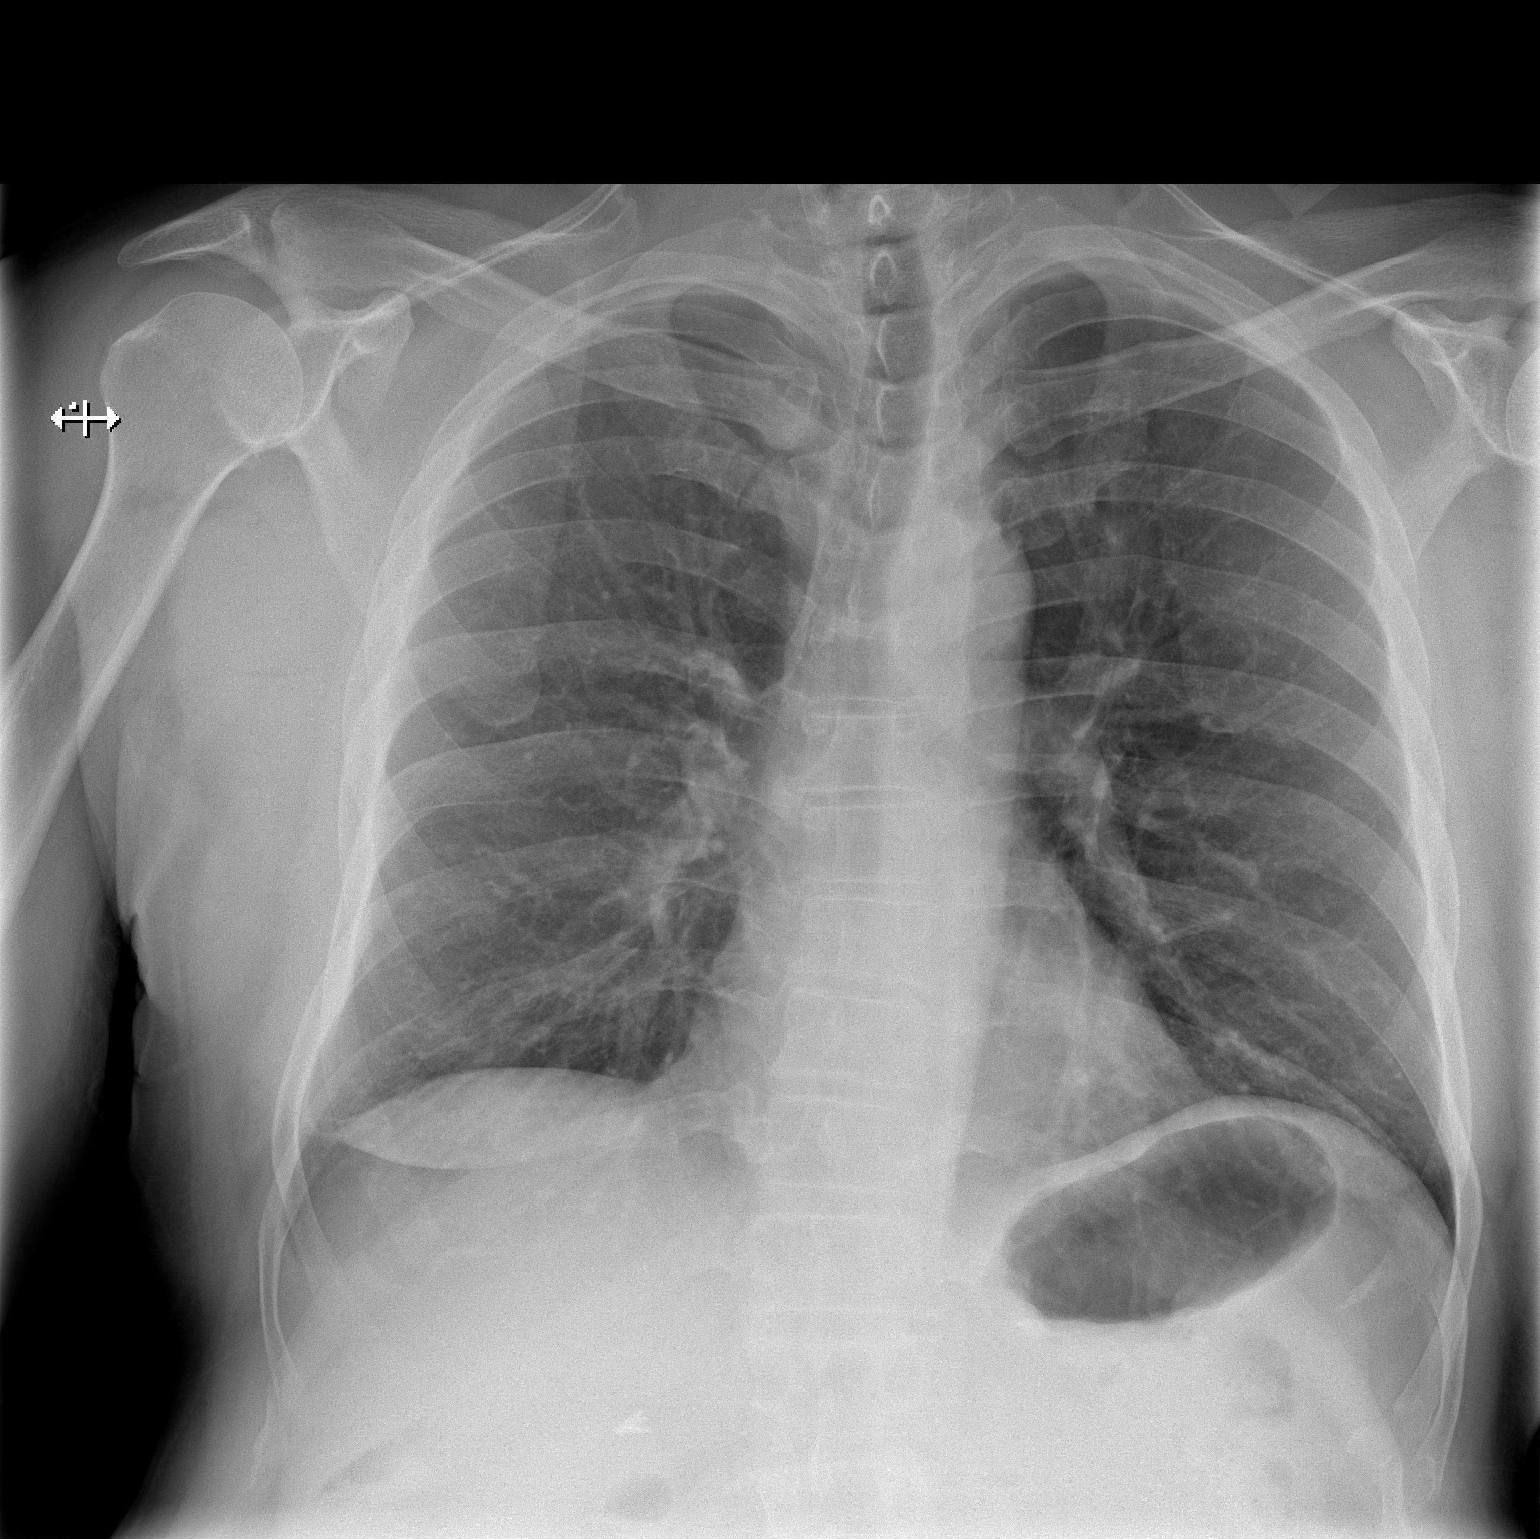

[w chest lat]
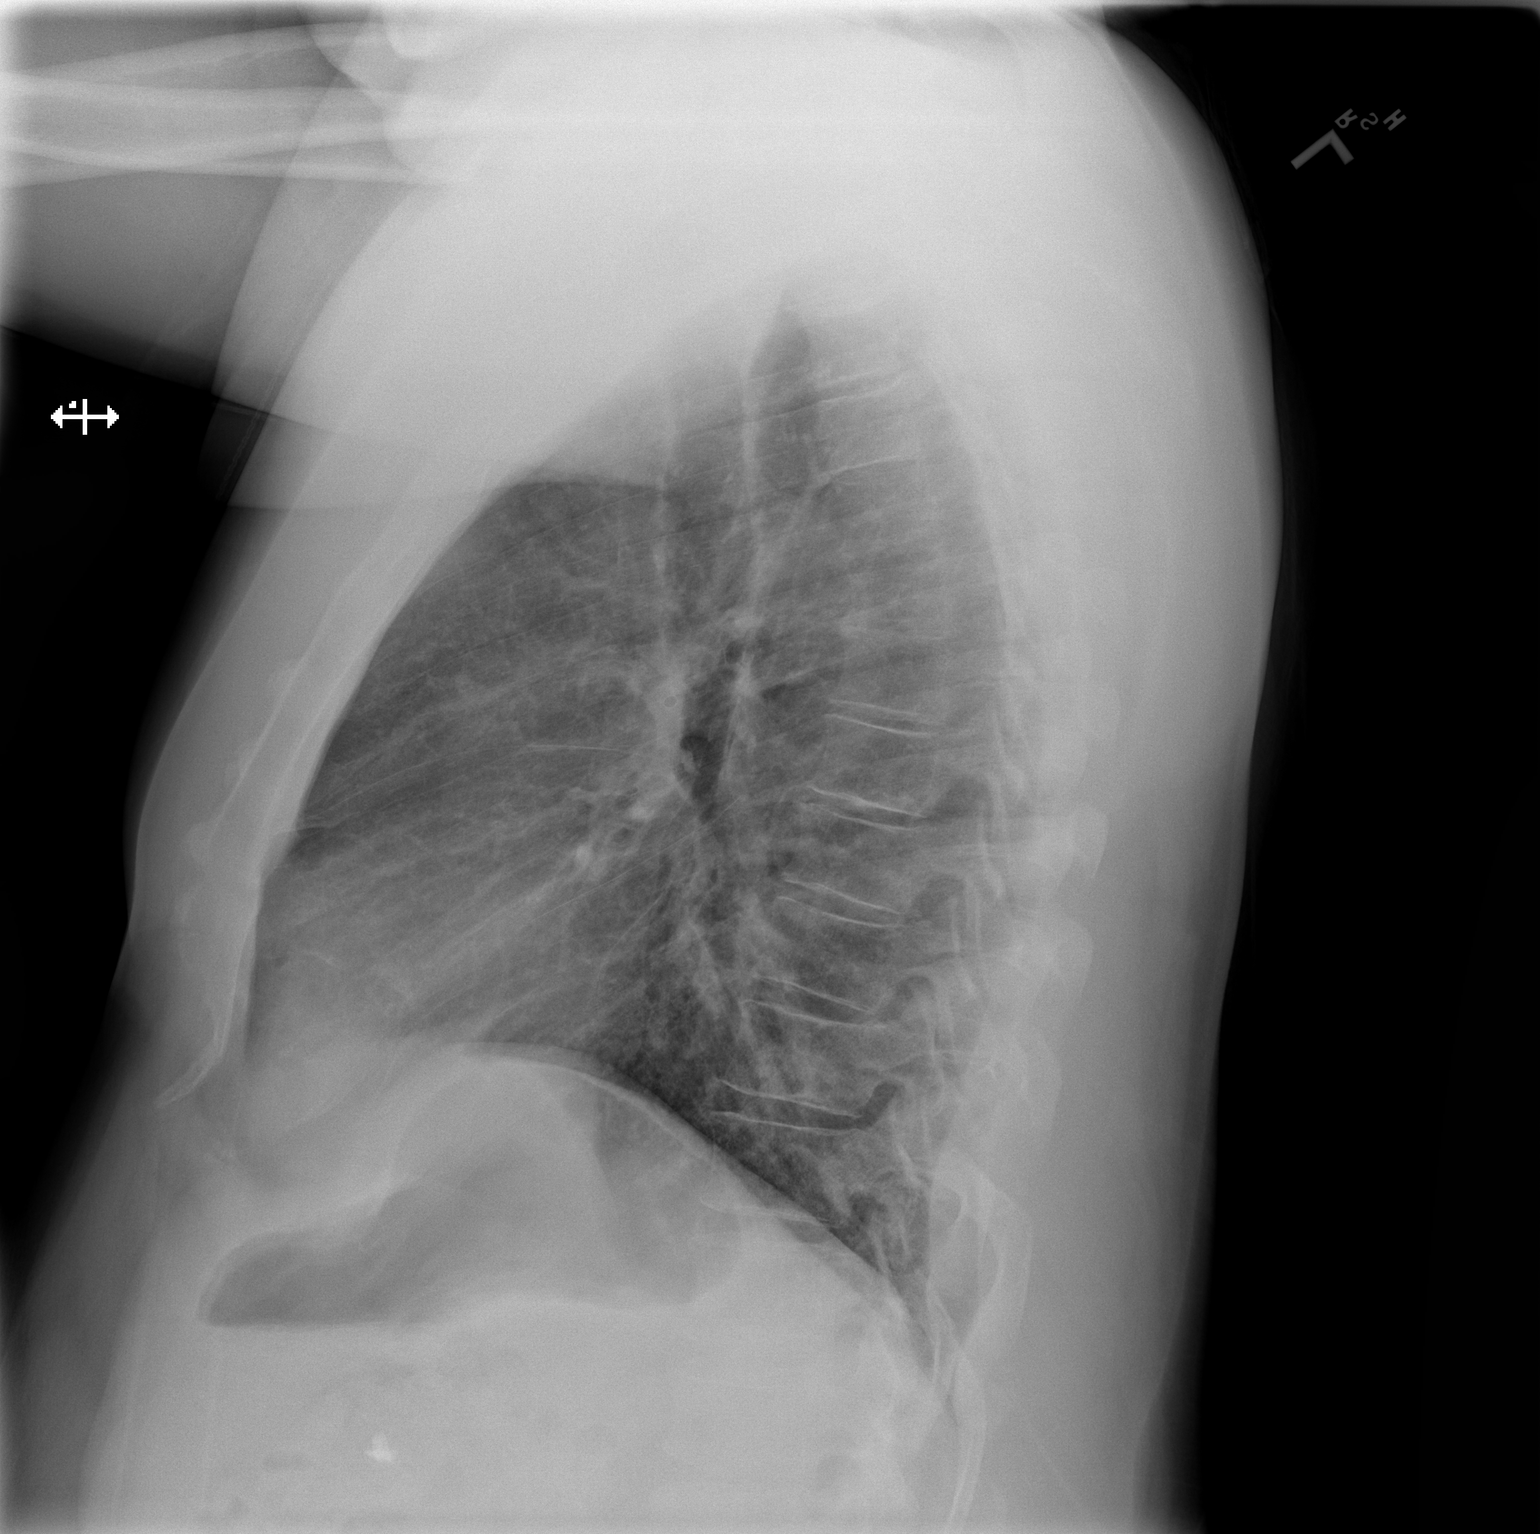

[2 of 2 positions shown; findings below may reference images not displayed]

FINDINGS: The heart size and mediastinal contours are within normal limits.
Both lungs are clear. The visualized skeletal structures are
unremarkable.
IMPRESSION: No active cardiopulmonary disease.

## 2015-04-03 ENCOUNTER — Other Ambulatory Visit: Payer: Self-pay | Admitting: Oncology

## 2015-04-06 ENCOUNTER — Inpatient Hospital Stay: Payer: Medicare Other | Attending: Oncology | Admitting: Oncology

## 2015-04-06 ENCOUNTER — Inpatient Hospital Stay: Payer: Medicare Other

## 2015-04-06 VITALS — BP 124/63 | HR 77 | Temp 97.5°F | Resp 18

## 2015-04-06 VITALS — BP 102/61 | HR 56 | Temp 96.8°F | Resp 18

## 2015-04-06 DIAGNOSIS — G35 Multiple sclerosis: Secondary | ICD-10-CM | POA: Diagnosis not present

## 2015-04-06 DIAGNOSIS — I10 Essential (primary) hypertension: Secondary | ICD-10-CM | POA: Insufficient documentation

## 2015-04-06 DIAGNOSIS — Z79899 Other long term (current) drug therapy: Secondary | ICD-10-CM | POA: Diagnosis not present

## 2015-04-06 DIAGNOSIS — F17210 Nicotine dependence, cigarettes, uncomplicated: Secondary | ICD-10-CM | POA: Insufficient documentation

## 2015-04-06 DIAGNOSIS — Z86711 Personal history of pulmonary embolism: Secondary | ICD-10-CM | POA: Diagnosis not present

## 2015-04-06 DIAGNOSIS — G894 Chronic pain syndrome: Secondary | ICD-10-CM | POA: Insufficient documentation

## 2015-04-06 MED ORDER — SODIUM CHLORIDE 0.9 % IJ SOLN
10.0000 mL | INTRAMUSCULAR | Status: DC | PRN
  Administered 2015-04-06: 10 mL via INTRAVENOUS
  Filled 2015-04-06: qty 10

## 2015-04-06 MED ORDER — HEPARIN SOD (PORK) LOCK FLUSH 100 UNIT/ML IV SOLN
500.0000 [IU] | Freq: Once | INTRAVENOUS | Status: AC
  Administered 2015-04-06: 500 [IU] via INTRAVENOUS

## 2015-04-06 MED ORDER — HEPARIN SOD (PORK) LOCK FLUSH 100 UNIT/ML IV SOLN
INTRAVENOUS | Status: AC
  Filled 2015-04-06: qty 5

## 2015-04-06 MED ORDER — NATALIZUMAB 300 MG/15ML IV CONC
300.0000 mg | Freq: Once | INTRAVENOUS | Status: AC
  Administered 2015-04-06: 300 mg via INTRAVENOUS
  Filled 2015-04-06: qty 15

## 2015-04-23 NOTE — Progress Notes (Signed)
[facility]  [phone] [phone]  ID: Simmie Davies OB: December 24, 1960  MR#: [id number]  HQI#:[id number]  Patient Care Team: Fleet Contras, MD as PCP - General (Internal Medicine)  CHIEF COMPLAINT:  Chief Complaint  Patient presents with  . Follow-up    ysabri infusion for multiple sclerosis(    INTERVAL HISTORY: Patient returns to clinic today for routine 6 month evaluation. He continues to tolerate Tysabri infusions without side effects.  Currently, he feels well and is at his baseline.  He has no new neurologic complaints.  He denies any recent fevers.  He has a good appetite and denies weight loss.  He has no chest pain or shortness of breath.  He denies any nausea, vomiting, constipation, or diarrhea.  He has no urinary complaints.  Patient offers no specific complaints today.  REVIEW OF SYSTEMS:   Review of Systems  Constitutional: Negative.   Eyes: Negative.   Neurological: Negative.     As per HPI. Otherwise, a complete review of systems is negatve.  PAST MEDICAL HISTORY: Past Medical History  Diagnosis Date  . Gallstones 02/17/2009  . Multiple sclerosis   . Pulmonary embolism     recurrent  . Chronic pain syndrome 01/25/2008  . Insomnia 11/06/2008  . Depression   . HTN (hypertension)   . Anxiety   . Other specified visual disturbances   . Pain in limb   . Nonspecific elevation of levels of transaminase or lactic acid dehydrogenase (LDH)   . Trigeminal neuralgia   . Other pulmonary embolism and infarction   . Abdominal pain, unspecified site   . Headache(784.0)   . Benign paroxysmal positional vertigo   . Other syndromes affecting cervical region   . Other B-complex deficiencies   . Memory loss   . Pain in limb   . Gait abnormality   . DVT (deep venous thrombosis)   . Arthritis     PAST SURGICAL HISTORY: Past Surgical History  Procedure Laterality Date  . Cholecystectomy  02/20/2009  . Portacath placement N/A  03/27/2014    Procedure: INSERTION PORT-A-CATH;  Surgeon: [name], MD;  Location: [facility] OR;  Service: General;  Laterality: N/A;    FAMILY HISTORY Family History  Problem Relation Age of Onset  . Cancer Father   . Diabetes Mother        ADVANCED DIRECTIVES:    HEALTH MAINTENANCE: History  Substance Use Topics  . Smoking status: Current Every Day Smoker -- 0.50 packs/day for 20 years    Types: Cigarettes  . Smokeless tobacco: Never Used  . Alcohol Use: No     Colonoscopy:  PAP:  Bone density:  Lipid panel:  No Known Allergies  Current Outpatient Prescriptions  Medication Sig Dispense Refill  . albuterol (PROVENTIL HFA;VENTOLIN HFA) 108 (90 BASE) MCG/ACT inhaler Inhale 2 puffs into the lungs 2 (two) times daily as needed for shortness of breath.     . baclofen (LIORESAL) 20 MG tablet Take 20 mg by mouth 4 (four) times daily.     [name] dalfampridine (AMPYRA) 10 MG TB12 Take 10 mg by mouth 2 (two) times daily.    . Eszopiclone (ESZOPICLONE) 3 MG TABS Take 3 mg by mouth at bedtime. Take immediately before bedtime    . gabapentin (NEURONTIN) 600 MG tablet Take 600 mg by mouth 4 (four) times daily.     [name] lisinopril-hydrochlorothiazide (PRINZIDE,ZESTORETIC) 10-12.5 MG per tablet Take 1 tablet by mouth daily.    . methocarbamol (ROBAXIN) 750 MG  tablet Take 1,500 mg by mouth 3 (three) times daily as needed for muscle spasms.     . natalizumab (TYSABRI) 300 MG/15ML injection Inject into the vein every 30 (thirty) days.     [name] omeprazole (PRILOSEC) 20 MG capsule Take 20 mg by mouth daily.  3  . Oxcarbazepine (TRILEPTAL) 300 MG tablet Take 300 mg by mouth 3 (three) times daily.    [name] oxyCODONE-acetaminophen (PERCOCET) 10-325 MG per tablet Take 1 tablet by mouth 4 (four) times daily as needed for pain.    [name] sertraline (ZOLOFT) 50 MG tablet Take 50 mg by mouth daily.     . verapamil (CALAN) 80 MG tablet Take 80 mg by mouth 2 (two) times daily.    [name] warfarin (COUMADIN) 5 MG tablet Take 5  mg by mouth every evening.     . clindamycin (CLEOCIN) 300 MG capsule Take 1 capsule (300 mg total) by mouth 4 (four) times daily. (Patient not taking: Reported on 11/04/2014) 40 capsule 0  . OxyCODONE (OXYCONTIN) 10 mg T12A 12 hr tablet Take 10 mg by mouth 3 (three) times daily.     No current facility-administered medications for this visit.    OBJECTIVE: Filed Vitals:   04/06/15 1017  BP: 124/63  Pulse: 77  Temp: 97.5 F (36.4 C)  Resp: 18     There is no weight on file to calculate BMI.    ECOG FS:0 - Asymptomatic  General: Well-developed, well-nourished, no acute distress. Eyes: anicteric sclera. Lungs: Clear to auscultation bilaterally. Heart: Regular rate and rhythm. No rubs, murmurs, or gallops. Abdomen: Soft, nontender, nondistended. No organomegaly noted, normoactive bowel sounds. Musculoskeletal: No edema, cyanosis, or clubbing. Neuro: Alert, answering all questions appropriately. Cranial nerves grossly intact. Skin: No rashes or petechiae noted. Psych: Normal affect.   LAB RESULTS:  Lab Results  Component Value Date   NA 140 05/14/2014   K 3.8 05/14/2014   CL 100 05/14/2014   CO2 25 05/14/2014   GLUCOSE 107* 05/14/2014   BUN 6 05/14/2014   CREATININE 0.66 05/14/2014   CALCIUM 9.1 05/14/2014   PROT 7.8 05/14/2014   ALBUMIN 3.9 05/14/2014   AST 25 05/14/2014   ALT 39 05/14/2014   ALKPHOS 95 05/14/2014   BILITOT 0.3 05/14/2014   GFRNONAA >90 05/14/2014   GFRAA >90 05/14/2014    Lab Results  Component Value Date   WBC 9.4 05/14/2014   NEUTROABS 4.3 05/14/2014   HGB 14.6 05/14/2014   HCT 43.8 05/14/2014   MCV 83.0 05/14/2014   PLT 268 05/14/2014     STUDIES: No results found.  ASSESSMENT: Tysabri infusion for multiple sclerosis.  PLAN:    1.  Tysabri infusion for multiple sclerosis: Patient will receive 300 mg Tysabri today.  He will return to clinic every 4 weeks for continuation of infusions.  He will then return to clinic in 6 months for  routine evaluation.  Patient understands that all of his laboratory work, including JC virus, will be monitored by his primary neurologist.  He also understands that if he has any questions regarding Tysabri or his multiple sclerosis but they should be directed towards Dr. Tinnie Gens.    Patient expressed understanding and was in agreement with this plan. He also understands that He can call clinic at any time with any questions, concerns, or complaints.    Jeralyn Ruths, MD   04/23/2015 12:54 PM

## 2015-05-04 ENCOUNTER — Inpatient Hospital Stay: Payer: Medicare Other

## 2015-05-07 ENCOUNTER — Inpatient Hospital Stay: Payer: Medicare Other | Attending: Oncology

## 2015-05-07 VITALS — BP 103/60 | HR 61 | Temp 97.0°F | Resp 18

## 2015-05-07 DIAGNOSIS — Z79899 Other long term (current) drug therapy: Secondary | ICD-10-CM | POA: Insufficient documentation

## 2015-05-07 DIAGNOSIS — G35 Multiple sclerosis: Secondary | ICD-10-CM

## 2015-05-07 MED ORDER — SODIUM CHLORIDE 0.9 % IV SOLN
Freq: Once | INTRAVENOUS | Status: AC
  Administered 2015-05-07: 10:00:00 via INTRAVENOUS
  Filled 2015-05-07: qty 1000

## 2015-05-07 MED ORDER — HEPARIN SOD (PORK) LOCK FLUSH 100 UNIT/ML IV SOLN
500.0000 [IU] | Freq: Once | INTRAVENOUS | Status: AC
  Administered 2015-05-07: 500 [IU]
  Filled 2015-05-07: qty 5

## 2015-05-07 MED ORDER — ACETAMINOPHEN 500 MG PO TABS
1000.0000 mg | ORAL_TABLET | Freq: Once | ORAL | Status: AC
  Administered 2015-05-07: 1000 mg via ORAL
  Filled 2015-05-07: qty 2

## 2015-05-07 MED ORDER — SODIUM CHLORIDE 0.9 % IJ SOLN
10.0000 mL | Freq: Once | INTRAMUSCULAR | Status: AC
  Administered 2015-05-07: 10 mL
  Filled 2015-05-07: qty 10

## 2015-05-07 MED ORDER — SODIUM CHLORIDE 0.9 % IV SOLN
300.0000 mg | Freq: Once | INTRAVENOUS | Status: AC
  Administered 2015-05-07: 300 mg via INTRAVENOUS
  Filled 2015-05-07: qty 15

## 2015-05-08 IMAGING — MR MR CERVICAL SPINE WO/W CM
4 of 8 series · 21 of 48 positions shown · IV contrast (19ml multihance)
Comparison: Cervical MRI 03/31/2012

CLINICAL DATA: Multiple sclerosis

BUN and creatinine were obtained on site at [HOSPITAL] at
[HOSPITAL].Results: BUN 6 mg/dL, Creatinine 0.8 mg/dL.
EXAM:
MRI CERVICAL SPINE WITHOUT AND WITH CONTRAST
TECHNIQUE: Multiplanar and multiecho pulse sequences of the cervical spine, to
include the craniocervical junction and cervicothoracic junction,
were obtained according to standard protocol without and with
intravenous contrast.
CONTRAST:  19mL MULTIHANCE GADOBENATE DIMEGLUMINE 529 MG/ML IV SOLN

[Series 4: T1 · sagittal · 3.0mm · 0.41mm/px · 3 of 12 slices shown]
[im 1/12]
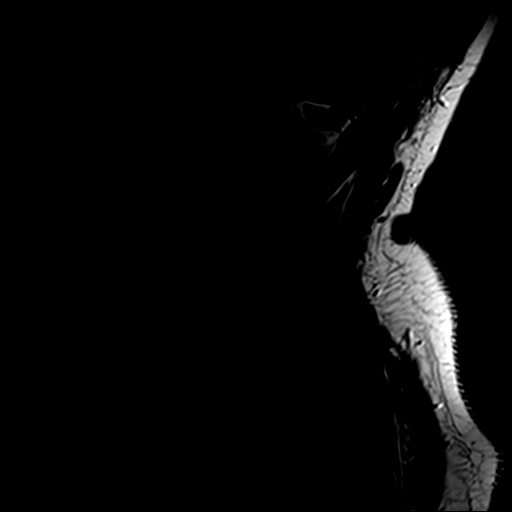
[im 6/12]
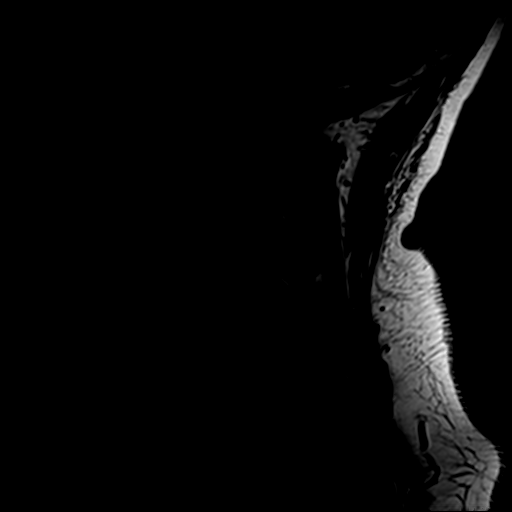
[im 12/12]
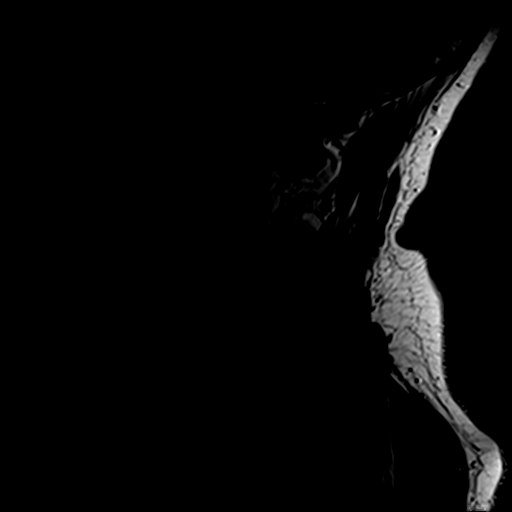

[Series 6: T2 · axial · 3.0mm · 0.39mm/px · z∈[-66,+42]mm · 9 of 30 slices shown (1 of 3)]
[im 1/30]
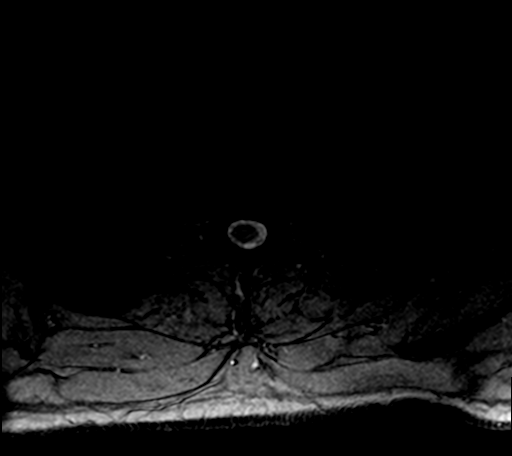
[im 4/30]
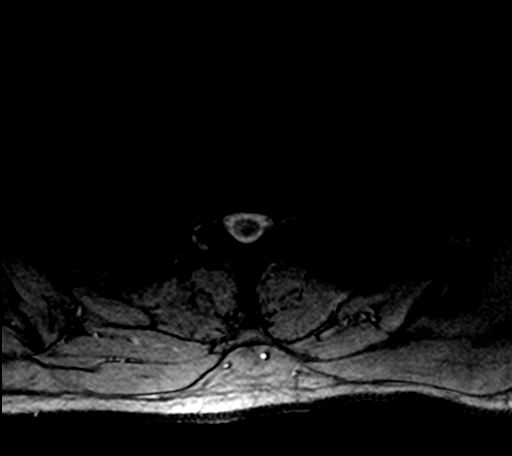
[im 8/30]
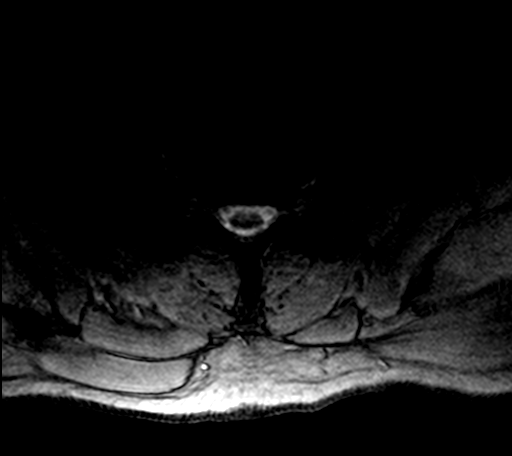
[im 11/30]
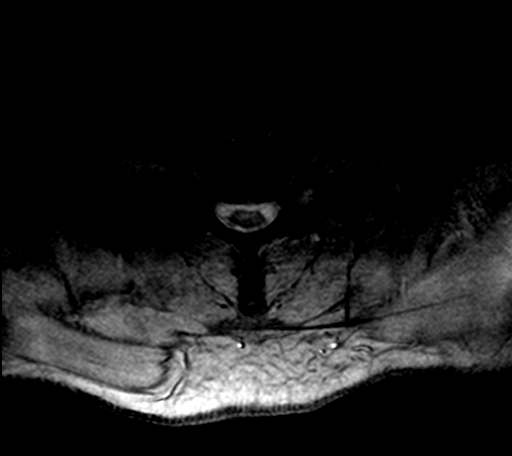
[im 15/30]
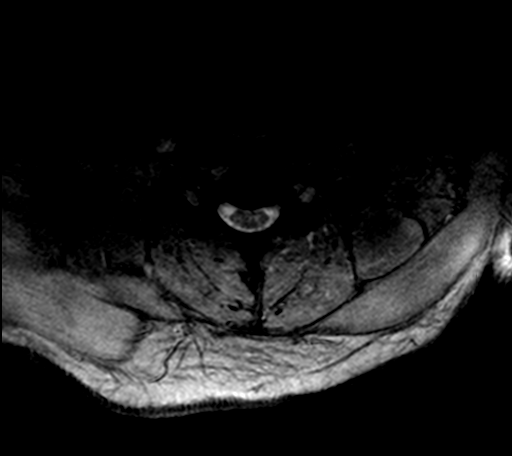
[im 19/30]
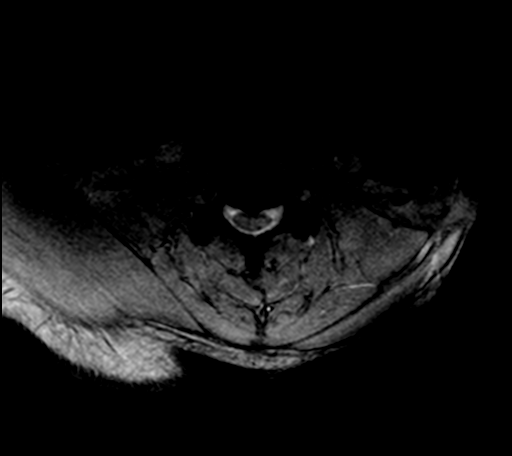
[im 22/30]
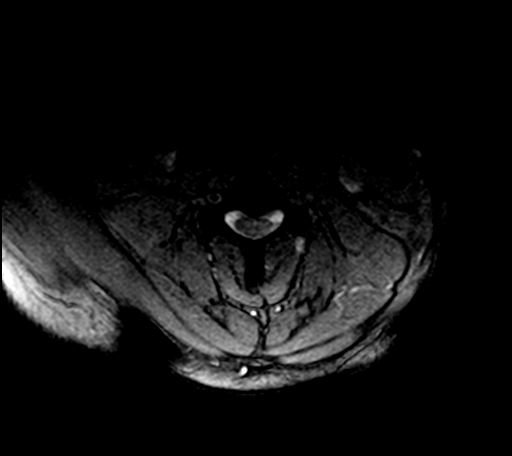
[im 26/30]
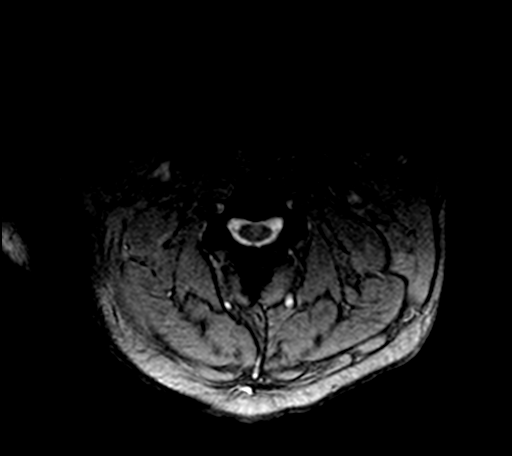
[im 30/30]
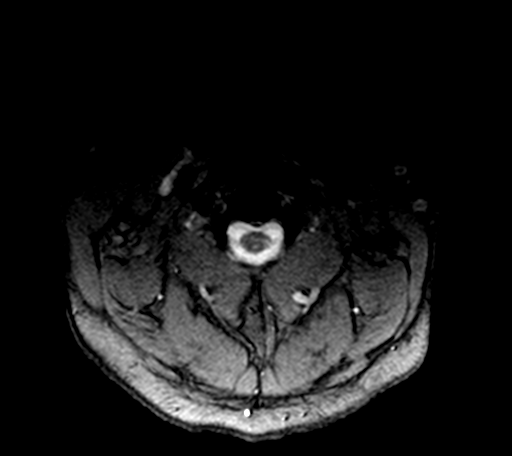

[Series 7: T2 · axial · 3.0mm · 0.39mm/px · z∈[-64,+30]mm · 6 of 30 slices shown (2 of 3)]
[im 1/30]
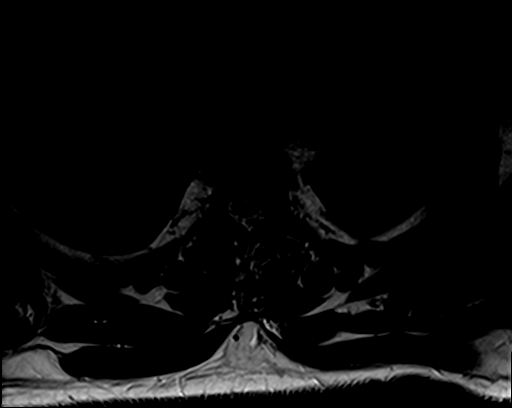
[im 4/30]
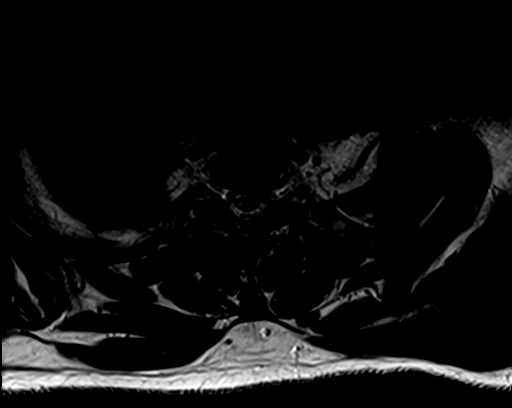
[im 8/30]
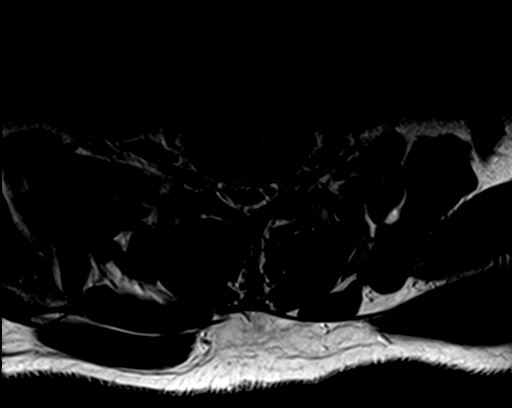
[im 11/30]
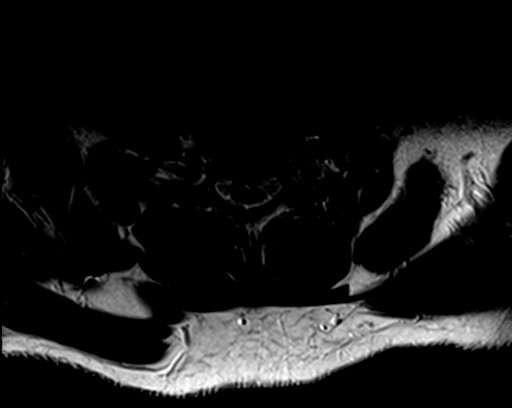
[im 15/30]
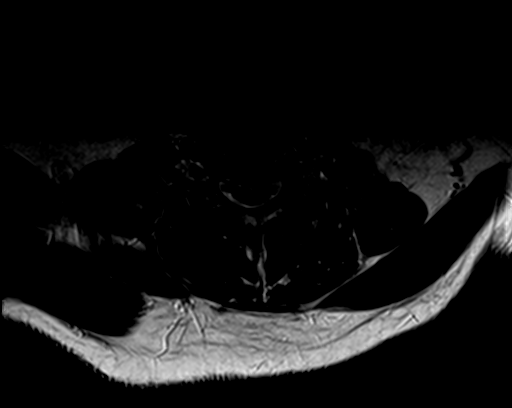
[im 26/30]
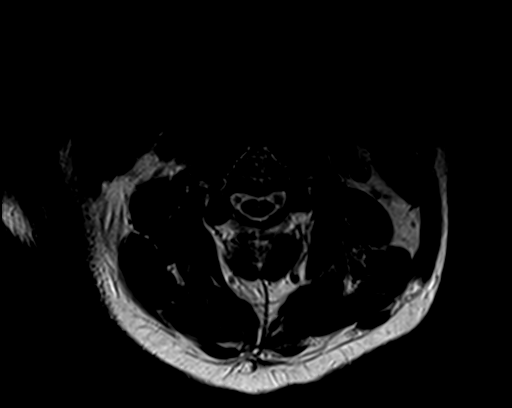

[Series 9: T2 · sagittal · 3.0mm · 0.41mm/px · 3 of 12 slices shown (3 of 3)]
[im 1/12]
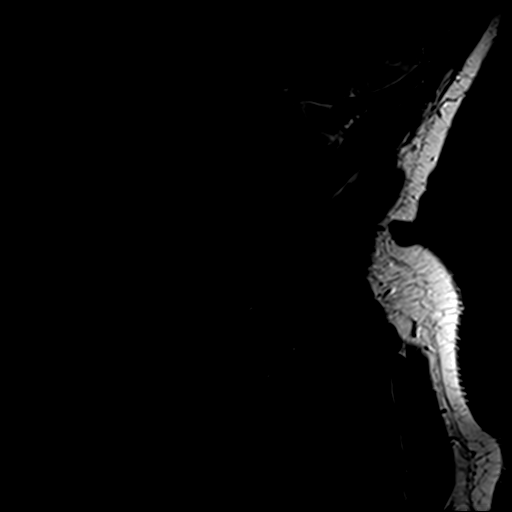
[im 6/12]
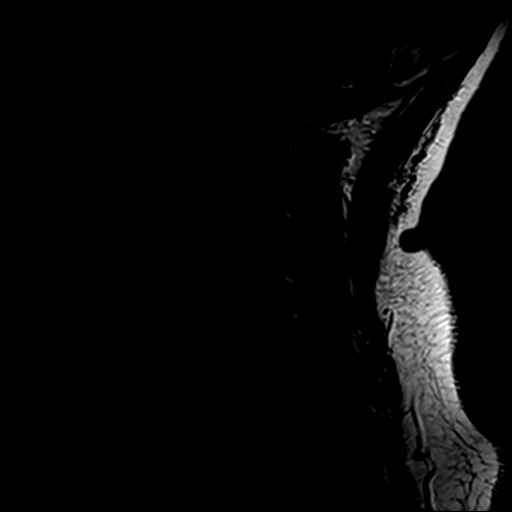
[im 12/12]
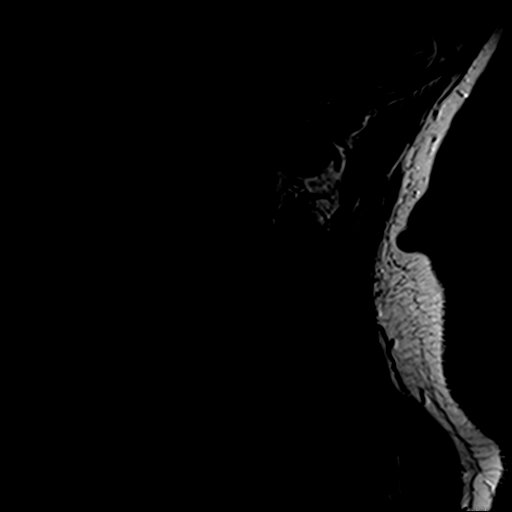

[21 of 48 positions shown; findings below may reference images not displayed]

FINDINGS: Extensive disease throughout the cervical spinal cord. Cord
hyperintensity is present centrally at C2-3, bilaterally at C3-4.
Left-sided plaque at C5. Right-sided plaque at C6. Right-sided
plaque at T1. These lesions are better seen on today's study but
similar in distribution to the prior study.

Postcontrast imaging reveals no enhancing lesions in the spinal
cord.

C2-3: Small central disc protrusion

C3-4: Disc degeneration and broad-based osteophyte formation with
cord flattening and mild spinal stenosis. Moderate foraminal
encroachment bilaterally.

C4-5: Central disc protrusion and osteophyte with mild spinal
stenosis

C5-6: Disc degeneration with diffuse uncinate spurring left greater
than right. Left foraminal is stenosis and mild spinal stenosis

C6-7:  Negative

C7-T1:  Negative
IMPRESSION: Extensive disease throughout the cervical spinal cord and upper
thoracic spine similar to the prior MRI of 03/31/2012. No enhancing
lesions in the cord.

Cervical spondylosis

## 2015-05-28 ENCOUNTER — Emergency Department (HOSPITAL_COMMUNITY): Payer: Medicare Other

## 2015-05-28 ENCOUNTER — Emergency Department (INDEPENDENT_AMBULATORY_CARE_PROVIDER_SITE_OTHER)
Admission: EM | Admit: 2015-05-28 | Discharge: 2015-05-28 | Disposition: A | Discharge status: Nursing Home (Includes ICF) | Payer: Medicare Other | Source: Home / Self Care | Attending: Family Medicine | Admitting: Family Medicine

## 2015-05-28 ENCOUNTER — Encounter (HOSPITAL_COMMUNITY): Payer: Self-pay | Admitting: Emergency Medicine

## 2015-05-28 ENCOUNTER — Emergency Department (HOSPITAL_COMMUNITY)
Admission: EM | Admit: 2015-05-28 | Discharge: 2015-05-29 | Disposition: A | Discharge status: Home / Self Care | Payer: Medicare Other | Attending: Emergency Medicine | Admitting: Emergency Medicine

## 2015-05-28 DIAGNOSIS — Z86718 Personal history of other venous thrombosis and embolism: Secondary | ICD-10-CM | POA: Diagnosis not present

## 2015-05-28 DIAGNOSIS — Z7901 Long term (current) use of anticoagulants: Secondary | ICD-10-CM | POA: Insufficient documentation

## 2015-05-28 DIAGNOSIS — G35 Multiple sclerosis: Secondary | ICD-10-CM | POA: Diagnosis not present

## 2015-05-28 DIAGNOSIS — F419 Anxiety disorder, unspecified: Secondary | ICD-10-CM | POA: Insufficient documentation

## 2015-05-28 DIAGNOSIS — R609 Edema, unspecified: Secondary | ICD-10-CM

## 2015-05-28 DIAGNOSIS — Z8719 Personal history of other diseases of the digestive system: Secondary | ICD-10-CM | POA: Insufficient documentation

## 2015-05-28 DIAGNOSIS — G4700 Insomnia, unspecified: Secondary | ICD-10-CM | POA: Diagnosis not present

## 2015-05-28 DIAGNOSIS — Z720 Tobacco use: Secondary | ICD-10-CM | POA: Diagnosis not present

## 2015-05-28 DIAGNOSIS — I10 Essential (primary) hypertension: Secondary | ICD-10-CM | POA: Insufficient documentation

## 2015-05-28 DIAGNOSIS — Z86711 Personal history of pulmonary embolism: Secondary | ICD-10-CM | POA: Diagnosis not present

## 2015-05-28 DIAGNOSIS — G500 Trigeminal neuralgia: Secondary | ICD-10-CM | POA: Insufficient documentation

## 2015-05-28 DIAGNOSIS — G894 Chronic pain syndrome: Secondary | ICD-10-CM | POA: Insufficient documentation

## 2015-05-28 DIAGNOSIS — Z7952 Long term (current) use of systemic steroids: Secondary | ICD-10-CM | POA: Diagnosis not present

## 2015-05-28 DIAGNOSIS — M1990 Unspecified osteoarthritis, unspecified site: Secondary | ICD-10-CM | POA: Insufficient documentation

## 2015-05-28 DIAGNOSIS — Z79899 Other long term (current) drug therapy: Secondary | ICD-10-CM | POA: Insufficient documentation

## 2015-05-28 DIAGNOSIS — R2243 Localized swelling, mass and lump, lower limb, bilateral: Secondary | ICD-10-CM | POA: Diagnosis present

## 2015-05-28 DIAGNOSIS — F329 Major depressive disorder, single episode, unspecified: Secondary | ICD-10-CM | POA: Insufficient documentation

## 2015-05-28 LAB — CBC WITH DIFFERENTIAL/PLATELET
BASOS ABS: 0.1 10*3/uL (ref 0.0–0.1)
BASOS PCT: 1 % (ref 0–1)
Eosinophils Absolute: 0.3 10*3/uL (ref 0.0–0.7)
Eosinophils Relative: 3 % (ref 0–5)
HEMATOCRIT: 41.1 % (ref 39.0–52.0)
Hemoglobin: 13.6 g/dL (ref 13.0–17.0)
LYMPHS ABS: 5.3 10*3/uL — AB (ref 0.7–4.0)
Lymphocytes Relative: 50 % — ABNORMAL HIGH (ref 12–46)
MCH: 27.5 pg (ref 26.0–34.0)
MCHC: 33.1 g/dL (ref 30.0–36.0)
MCV: 83.2 fL (ref 78.0–100.0)
MONOS PCT: 9 % (ref 3–12)
Monocytes Absolute: 0.9 10*3/uL (ref 0.1–1.0)
NEUTROS PCT: 37 % — AB (ref 43–77)
Neutro Abs: 3.8 10*3/uL (ref 1.7–7.7)
PLATELETS: 209 10*3/uL (ref 150–400)
RBC: 4.94 MIL/uL (ref 4.22–5.81)
RDW: 16.4 % — AB (ref 11.5–15.5)
WBC: 10.4 10*3/uL (ref 4.0–10.5)

## 2015-05-28 LAB — TROPONIN I: Troponin I: <0.03 ng/mL (ref <0.031)

## 2015-05-28 LAB — PROTIME-INR
INR: 1.93 — ABNORMAL HIGH (ref 0.00–1.49)
Prothrombin Time: 22 seconds — ABNORMAL HIGH (ref 11.6–15.2)

## 2015-05-28 LAB — COMPREHENSIVE METABOLIC PANEL
ALBUMIN: 3.3 g/dL — AB (ref 3.5–5.0)
ALT: 33 U/L (ref 17–63)
ANION GAP: 9 (ref 5–15)
AST: 19 U/L (ref 15–41)
Alkaline Phosphatase: 86 U/L (ref 38–126)
BILIRUBIN TOTAL: 0.6 mg/dL (ref 0.3–1.2)
BUN: 7 mg/dL (ref 6–20)
CALCIUM: 8.6 mg/dL — AB (ref 8.9–10.3)
CO2: 24 mmol/L (ref 22–32)
Chloride: 106 mmol/L (ref 101–111)
Creatinine, Ser: 0.83 mg/dL (ref 0.61–1.24)
GFR calc Af Amer: >60 mL/min (ref >60)
GFR calc non Af Amer: >60 mL/min (ref >60)
Glucose, Bld: 106 mg/dL — ABNORMAL HIGH (ref 65–99)
POTASSIUM: 3.3 mmol/L — AB (ref 3.5–5.1)
SODIUM: 139 mmol/L (ref 135–145)
Total Protein: 6.8 g/dL (ref 6.5–8.1)

## 2015-05-28 LAB — BRAIN NATRIURETIC PEPTIDE: B Natriuretic Peptide: 8 pg/mL (ref 0.0–100.0)

## 2015-05-28 MED ORDER — LEVETIRACETAM 500 MG PO TABS
500.0000 mg | ORAL_TABLET | Freq: Once | ORAL | Status: AC
  Administered 2015-05-28: 500 mg via ORAL
  Filled 2015-05-28: qty 1

## 2015-05-28 MED ORDER — GABAPENTIN 600 MG PO TABS
600.0000 mg | ORAL_TABLET | Freq: Once | ORAL | Status: AC
Start: 2015-05-29 — End: 2015-05-28
  Administered 2015-05-28: 600 mg via ORAL
  Filled 2015-05-28: qty 1

## 2015-05-28 MED ORDER — FUROSEMIDE 20 MG PO TABS: 20.0000 mg | ORAL_TABLET | Freq: Every day | ORAL | Status: DC

## 2015-05-28 MED ORDER — POTASSIUM CHLORIDE ER 10 MEQ PO TBCR: 20.0000 meq | EXTENDED_RELEASE_TABLET | Freq: Every day | ORAL | Status: DC

## 2015-05-28 MED ORDER — LISINOPRIL 10 MG PO TABS
10.0000 mg | ORAL_TABLET | Freq: Once | ORAL | Status: AC
  Administered 2015-05-28: 10 mg via ORAL
  Filled 2015-05-28: qty 1

## 2015-05-28 MED ORDER — OXYCODONE-ACETAMINOPHEN 5-325 MG PO TABS
2.0000 | ORAL_TABLET | Freq: Once | ORAL | Status: AC
  Administered 2015-05-28: 2 via ORAL
  Filled 2015-05-28: qty 2

## 2015-05-28 MED ORDER — MORPHINE SULFATE 4 MG/ML IJ SOLN
4.0000 mg | Freq: Once | INTRAMUSCULAR | Status: AC
  Administered 2015-05-28: 4 mg via INTRAVENOUS
  Filled 2015-05-28: qty 1

## 2015-05-28 NOTE — ED Notes (Signed)
Patient has swollen feet and lower extremities.  Pedal pulses 2 plus.  Reports swelling for 2 days.  Says he has talked to his doctor and told to elevate them.  Patient reports they are getting no better.  Patient has not called pcp to reports elevation of power extremities not helping.

## 2015-05-28 NOTE — ED Notes (Addendum)
Pt sent from  Urgent Care with c/o increased leg swelling for 2 days, has been elevating them without relief, hx of MS -- states that "this fluid is going to my head, making me dizzy" pt in automatic w/c. - pt states has a headache, and "I have been taking my percocets hard, took last one this am, supposed to get it filled tomorrow" .

## 2015-05-28 NOTE — ED Notes (Signed)
IV team consulted about safety of drawing off of port-a-cath that is coiled. IV team RN advised safe to draw blood and advised to put in a consult if we need them to come down and start iv/draw blood if port-a-cath is unsuccessful in giving blood.

## 2015-05-28 NOTE — ED Notes (Signed)
Patient transported to X-ray 

## 2015-05-28 NOTE — Discharge Instructions (Signed)
Peripheral Edema °You have swelling in your legs (peripheral edema). This swelling is due to excess accumulation of salt and water in your body. Edema may be a sign of heart, kidney or liver disease, or a side effect of a medication. It may also be due to problems in the leg veins. Elevating your legs and using special support stockings may be very helpful, if the cause of the swelling is due to poor venous circulation. Avoid long periods of standing, whatever the cause. °Treatment of edema depends on identifying the cause. Chips, pretzels, pickles and other salty foods should be avoided. Restricting salt in your diet is almost always needed. Water pills (diuretics) are often used to remove the excess salt and water from your body via urine. These medicines prevent the kidney from reabsorbing sodium. This increases urine flow. °Diuretic treatment may also result in lowering of potassium levels in your body. Potassium supplements may be needed if you have to use diuretics daily. Daily weights can help you keep track of your progress in clearing your edema. You should call your caregiver for follow up care as recommended. °SEEK IMMEDIATE MEDICAL CARE IF:  °· You have increased swelling, pain, redness, or heat in your legs. °· You develop shortness of breath, especially when lying down. °· You develop chest or abdominal pain, weakness, or fainting. °· You have a fever. °Document Released: 12/04/2004 Document Revised: 01/19/2012 Document Reviewed: 11/14/2009 °Exit[employer]® Patient Information ©2015 [employer]. This information is not intended to replace advice given to you by your health care provider. Make sure you discuss any questions you have with your health care provider. ° °Hypokalemia °Hypokalemia means that the amount of potassium in the blood is lower than normal. Potassium is a chemical, called an electrolyte, that helps regulate the amount of fluid in the body. It also stimulates muscle contraction and helps  nerves function properly. Most of the body's potassium is inside of cells, and only a very small amount is in the blood. Because the amount in the blood is so small, minor changes can be life-threatening. °CAUSES °· Antibiotics. °· Diarrhea or vomiting. °· Using laxatives too much, which can cause diarrhea. °· Chronic kidney disease. °· Water pills (diuretics). °· Eating disorders (bulimia). °· Low magnesium level. °· Sweating a lot. °SIGNS AND SYMPTOMS °· Weakness. °· Constipation. °· Fatigue. °· Muscle cramps. °· Mental confusion. °· Skipped heartbeats or irregular heartbeat (palpitations). °· Tingling or numbness. °DIAGNOSIS  °Your health care provider can diagnose hypokalemia with blood tests. In addition to checking your potassium level, your health care provider may also check other lab tests. °TREATMENT °Hypokalemia can be treated with potassium supplements taken by mouth or adjustments in your current medicines. If your potassium level is very low, you may need to get potassium through a vein (IV) and be monitored in the hospital. A diet high in potassium is also helpful. Foods high in potassium are: °· Nuts, such as peanuts and pistachios. °· Seeds, such as sunflower seeds and pumpkin seeds. °· Peas, lentils, and lima beans. °· Whole grain and bran cereals and breads. °· Fresh fruit and vegetables, such as apricots, avocado, bananas, cantaloupe, kiwi, oranges, tomatoes, asparagus, and potatoes. °· Orange and tomato juices. °· Red meats. °· Fruit yogurt. °HOME CARE INSTRUCTIONS °· Take all medicines as prescribed by your health care provider. °· Maintain a healthy diet by including nutritious food, such as fruits, vegetables, nuts, whole grains, and lean meats. °· If you are taking a laxative, be sure to follow   the directions on the label. °SEEK MEDICAL CARE IF: °· Your weakness gets worse. °· You feel your heart pounding or racing. °· You are vomiting or having diarrhea. °· You are diabetic and having  trouble keeping your blood glucose in the normal range. °SEEK IMMEDIATE MEDICAL CARE IF: °· You have chest pain, shortness of breath, or dizziness. °· You are vomiting or having diarrhea for more than 2 days. °· You faint. °MAKE SURE YOU:  °· Understand these instructions. °· Will watch your condition. °· Will get help right away if you are not doing well or get worse. °Document Released: 10/27/2005 Document Revised: 08/17/2013 Document Reviewed: 04/29/2013 °ExitCare® Patient Information ©2015 [employer]. This information is not intended to replace advice given to you by your health care provider. Make sure you discuss any questions you have with your health care provider. ° °

## 2015-05-28 NOTE — ED Notes (Signed)
IV team RN at bedside. Advised he would deacess patient's port-a-cath since no meds can be run through it.

## 2015-05-28 NOTE — Progress Notes (Signed)
Implanted port tip in SVC with line coiled in the vein. Deaccessed after heplocking device. Recommend IR reposition of line.

## 2015-05-28 NOTE — ED Notes (Signed)
Dr. Donnald Garre advised okay to attempt to draw labs off of patients port-a-cath since xray showed coiling of catheter.

## 2015-05-28 NOTE — ED Provider Notes (Signed)
CSN: [id number]     Arrival date & time 05/28/15  1552 History   First MD Initiated Contact with Patient 05/28/15 1726     Chief Complaint  Patient presents with  . Foot Swelling   (Consider location/radiation/quality/duration/timing/severity/associated sxs/prior Treatment) Patient is a 54 y.o. male presenting with shortness of breath. The history is provided by the patient.  Shortness of Breath Severity:  Moderate Onset quality:  Sudden Duration:  2 days Timing:  Constant Progression:  Worsening Chronicity:  New Context comment:  Pt in wheelchair, no change in edema with leg elevation. Associated symptoms: no chest pain and no fever   Risk factors: hx of PE/DVT and prolonged immobilization     Past Medical History  Diagnosis Date  . Gallstones 02/17/2009  . Multiple sclerosis   . Pulmonary embolism     recurrent  . Chronic pain syndrome 01/25/2008  . Insomnia 11/06/2008  . Depression   . HTN (hypertension)   . Anxiety   . Other specified visual disturbances   . Pain in limb   . Nonspecific elevation of levels of transaminase or lactic acid dehydrogenase (LDH)   . Trigeminal neuralgia   . Other pulmonary embolism and infarction   . Abdominal pain, unspecified site   . Headache(784.0)   . Benign paroxysmal positional vertigo   . Other syndromes affecting cervical region   . Other B-complex deficiencies   . Memory loss   . Pain in limb   . Gait abnormality   . DVT (deep venous thrombosis)   . Arthritis    Past Surgical History  Procedure Laterality Date  . Cholecystectomy  02/20/2009  . Portacath placement N/A 03/27/2014    Procedure: INSERTION PORT-A-CATH;  Surgeon: [name], MD;  Location: [facility] OR;  Service: General;  Laterality: N/A;   Family History  Problem Relation Age of Onset  . Cancer Father   . Diabetes Mother    History  Substance Use Topics  . Smoking status: Current Every Day Smoker -- 0.50 packs/day for 20 years    Types: Cigarettes  .  Smokeless tobacco: Never Used  . Alcohol Use: No    Review of Systems  Constitutional: Negative.  Negative for fever.  Respiratory: Positive for shortness of breath.   Cardiovascular: Positive for leg swelling. Negative for chest pain and palpitations.  Gastrointestinal: Negative.     Allergies  Review of patient's allergies indicates no known allergies.  Home Medications   Prior to Admission medications   Medication Sig Start Date End Date Taking? Authorizing Provider  albuterol (PROVENTIL HFA;VENTOLIN HFA) 108 (90 BASE) MCG/ACT inhaler Inhale 2 puffs into the lungs 2 (two) times daily as needed for shortness of breath.     Historical Provider, MD  baclofen (LIORESAL) 20 MG tablet Take 20 mg by mouth 4 (four) times daily.     Historical Provider, MD  clindamycin (CLEOCIN) 300 MG capsule Take 1 capsule (300 mg total) by mouth 4 (four) times daily. Patient not taking: Reported on 11/04/2014 10/13/14   Elson Areas, PA-C  dalfampridine (AMPYRA) 10 MG TB12 Take 10 mg by mouth 2 (two) times daily.    Historical Provider, MD  Eszopiclone (ESZOPICLONE) 3 MG TABS Take 3 mg by mouth at bedtime. Take immediately before bedtime    Historical Provider, MD  gabapentin (NEURONTIN) 600 MG tablet Take 600 mg by mouth 4 (four) times daily.     Historical Provider, MD  lisinopril-hydrochlorothiazide (PRINZIDE,ZESTORETIC) 10-12.5 MG per tablet Take 1 tablet by  mouth daily.    Historical Provider, MD  methocarbamol (ROBAXIN) 750 MG tablet Take 1,500 mg by mouth 3 (three) times daily as needed for muscle spasms.  02/27/14   Historical Provider, MD  natalizumab (TYSABRI) 300 MG/15ML injection Inject into the vein every 30 (thirty) days.     Historical Provider, MD  omeprazole (PRILOSEC) 20 MG capsule Take 20 mg by mouth daily. 03/03/15   Historical Provider, MD  Oxcarbazepine (TRILEPTAL) 300 MG tablet Take 300 mg by mouth 3 (three) times daily.    Historical Provider, MD  OxyCODONE (OXYCONTIN) 10 mg T12A 12  hr tablet Take 10 mg by mouth 3 (three) times daily.    Historical Provider, MD  oxyCODONE-acetaminophen (PERCOCET) 10-325 MG per tablet Take 1 tablet by mouth 4 (four) times daily as needed for pain.    Historical Provider, MD  sertraline (ZOLOFT) 50 MG tablet Take 50 mg by mouth daily.     Historical Provider, MD  verapamil (CALAN) 80 MG tablet Take 80 mg by mouth 2 (two) times daily.    Historical Provider, MD  warfarin (COUMADIN) 5 MG tablet Take 5 mg by mouth every evening.     Historical Provider, MD   BP 159/78 mmHg  Pulse 74  Temp(Src) 97.6 F (36.4 C) (Oral)  Resp 12  SpO2 99% Physical Exam  Constitutional: He is oriented to person, place, and time. He appears well-developed and well-nourished. No distress.  Neck: Normal range of motion. Neck supple.  Cardiovascular: Normal rate, regular rhythm, normal heart sounds and intact distal pulses.   Pulmonary/Chest: No respiratory distress. He has decreased breath sounds. He exhibits no tenderness.  Musculoskeletal: He exhibits edema.  Neurological: He is alert and oriented to person, place, and time.  Skin: Skin is warm and dry.  Nursing note and vitals reviewed.   ED Course  Procedures (including critical care time) Labs Review Labs Reviewed - No data to display  Imaging Review No results found.   MDM   1. Peripheral edema    Sent to r/o chf problem.Linna Hoff, MD 05/28/15 [phone]

## 2015-05-28 NOTE — ED Notes (Signed)
Pt reports swelling in bilateral lower legs x2 days, reports he has a hx of same and has taken fluid pills in the past for this but no longer takes them. Was seen at urgent care and sent here for further evaluation.

## 2015-05-28 NOTE — ED Provider Notes (Signed)
CSN: [id number]     Arrival date & time 05/28/15  1755 History   First MD Initiated Contact with Patient 05/28/15 1931     Chief Complaint  Patient presents with  . Leg Swelling     (Consider location/radiation/quality/duration/timing/severity/associated sxs/prior Treatment) HPI Patient reports increased swelling of his legs for approximately 2 days. He reports he has not had swelling this large in the past. He denies any history of taking "fluid pills". He states also he has had increasing shortness of breath. He denies associated chest pain. He has noted some coughing without any sputum production. He is tried to elevate his legs per the recommendation of his neurologist however he has not seen any improvement. He reports his legs do hurt somewhat due to the swelling. He states he feels like the fluid is building all the way out from his legs up to his head. He states his given him a headache here recently. He describes generalized aching without any confusion. The patient has MS and is at baseline in a wheelchair. Past Medical History  Diagnosis Date  . Gallstones 02/17/2009  . Multiple sclerosis   . Pulmonary embolism     recurrent  . Chronic pain syndrome 01/25/2008  . Insomnia 11/06/2008  . Depression   . HTN (hypertension)   . Anxiety   . Other specified visual disturbances   . Pain in limb   . Nonspecific elevation of levels of transaminase or lactic acid dehydrogenase (LDH)   . Trigeminal neuralgia   . Other pulmonary embolism and infarction   . Abdominal pain, unspecified site   . Headache(784.0)   . Benign paroxysmal positional vertigo   . Other syndromes affecting cervical region   . Other B-complex deficiencies   . Memory loss   . Pain in limb   . Gait abnormality   . DVT (deep venous thrombosis)   . Arthritis    Past Surgical History  Procedure Laterality Date  . Cholecystectomy  02/20/2009  . Portacath placement N/A 03/27/2014    Procedure: INSERTION  PORT-A-CATH;  Surgeon: [name], MD;  Location: [facility] OR;  Service: General;  Laterality: N/A;   Family History  Problem Relation Age of Onset  . Cancer Father   . Diabetes Mother    History  Substance Use Topics  . Smoking status: Current Every Day Smoker -- 0.50 packs/day for 20 years    Types: Cigarettes  . Smokeless tobacco: Never Used  . Alcohol Use: No    Review of Systems 10 Systems reviewed and are negative for acute change except as noted in the HPI.    Allergies  Review of patient's allergies indicates no known allergies.  Home Medications   Prior to Admission medications   Medication Sig Start Date End Date Taking? Authorizing Provider  albuterol (PROVENTIL HFA;VENTOLIN HFA) 108 (90 BASE) MCG/ACT inhaler Inhale 2 puffs into the lungs 2 (two) times daily as needed for shortness of breath.    Yes Historical Provider, MD  baclofen (LIORESAL) 20 MG tablet Take 20 mg by mouth 4 (four) times daily.    Yes Historical Provider, MD  dalfampridine (AMPYRA) 10 MG TB12 Take 10 mg by mouth 2 (two) times daily.   Yes Historical Provider, MD  Eszopiclone (ESZOPICLONE) 3 MG TABS Take 3 mg by mouth at bedtime. Take immediately before bedtime   Yes Historical Provider, MD  gabapentin (NEURONTIN) 600 MG tablet Take 600 mg by mouth 4 (four) times daily.    Yes Historical Provider,  MD  levETIRAcetam (KEPPRA) 500 MG tablet Take 500 mg by mouth 2 (two) times daily.   Yes Historical Provider, MD  lisinopril-hydrochlorothiazide (PRINZIDE,ZESTORETIC) 10-12.5 MG per tablet Take 1 tablet by mouth daily.   Yes Historical Provider, MD  natalizumab (TYSABRI) 300 MG/15ML injection Inject into the vein every 30 (thirty) days.    Yes Historical Provider, MD  omeprazole (PRILOSEC) 20 MG capsule Take 20 mg by mouth daily. 03/03/15  Yes Historical Provider, MD  Oxcarbazepine (TRILEPTAL) 300 MG tablet Take 300 mg by mouth 3 (three) times daily.   Yes Historical Provider, MD  oxyCODONE-acetaminophen  (PERCOCET) 10-325 MG per tablet Take 1 tablet by mouth every 4 (four) hours.    Yes Historical Provider, MD  sertraline (ZOLOFT) 50 MG tablet Take 50 mg by mouth daily.    Yes Historical Provider, MD  topiramate (TOPAMAX) 50 MG tablet Take 50 mg by mouth 2 (two) times daily. 05/21/15  Yes Historical Provider, MD  triamterene-hydrochlorothiazide (MAXZIDE-25) 37.5-25 MG per tablet Take 1 tablet by mouth daily. 02/25/15  Yes Historical Provider, MD  verapamil (CALAN) 80 MG tablet Take 80 mg by mouth 2 (two) times daily.   Yes Historical Provider, MD  warfarin (COUMADIN) 5 MG tablet Take 5 mg by mouth every evening.    Yes Historical Provider, MD  clindamycin (CLEOCIN) 300 MG capsule Take 1 capsule (300 mg total) by mouth 4 (four) times daily. Patient not taking: Reported on 11/04/2014 10/13/14   Elson Areas, PA-C  furosemide (LASIX) 20 MG tablet Take 1 tablet (20 mg total) by mouth daily. As needed for leg swelling. 05/28/15   Arby Barrette, MD  potassium chloride (K-DUR) 10 MEQ tablet Take 2 tablets (20 mEq total) by mouth daily. 05/28/15   Arby Barrette, MD  predniSONE (DELTASONE) 20 MG tablet Take 20 mg by mouth daily. 05/09/15   Historical Provider, MD   BP 131/101 mmHg  Pulse 72  Temp(Src) 98 F (36.7 C) (Oral)  Resp 18  Ht 5\' 8"  (1.727 m)  Wt 192 lb (87.091 kg)  BMI 29.20 kg/m2  SpO2 94% Physical Exam  Constitutional: He is oriented to person, place, and time.  Patient is alert and nontoxic. He has no acute respiratory distress.  HENT:  Head: Normocephalic and atraumatic.  Mouth/Throat: Oropharynx is clear and moist. No oropharyngeal exudate.  Eyes: EOM are normal. Pupils are equal, round, and reactive to light.  Neck: Neck supple.  Cardiovascular: Normal rate, regular rhythm, normal heart sounds and intact distal pulses.   Pulmonary/Chest: Effort normal. No respiratory distress. He has no wheezes. He has rales (find Basler rails.).  Abdominal: Soft. Bowel sounds are normal. He  exhibits no distension. There is no tenderness.  Musculoskeletal: He exhibits edema.  1+ pitting edema bilateral lower extremities. His pulses are 2+ and symmetric. Minor superficial abrasion to the left lower extremity. No evidence of secondary infection.  Neurological: He is alert and oriented to person, place, and time.  Patient has MS with gait dysfunction and lower extremity weakness chronically. No acute changes.  Skin: Skin is warm and dry.  Psychiatric: He has a normal mood and affect.    ED Course  Procedures (including critical care time) Labs Review Labs Reviewed  COMPREHENSIVE METABOLIC PANEL - Abnormal; Notable for the following:    Potassium 3.3 (*)    Glucose, Bld 106 (*)    Calcium 8.6 (*)    Albumin 3.3 (*)    All other components within normal limits  CBC WITH  DIFFERENTIAL/PLATELET - Abnormal; Notable for the following:    RDW 16.4 (*)    Neutrophils Relative % 37 (*)    Lymphocytes Relative 50 (*)    Lymphs Abs 5.3 (*)    All other components within normal limits  PROTIME-INR - Abnormal; Notable for the following:    Prothrombin Time 22.0 (*)    INR 1.93 (*)    All other components within normal limits  BRAIN NATRIURETIC PEPTIDE  TROPONIN I    Imaging Review No results found.   EKG Interpretation   Date/Time:  Monday May 28 2015 20:36:48 EDT Ventricular Rate:  68 PR Interval:  192 QRS Duration: 87 QT Interval:  406 QTC Calculation: 432 R Axis:   51 Text Interpretation:  Sinus rhythm Abnormal R-wave progression, early  transition no STEMI, NO CHANGE FROM PREVIOUS Confirmed by Donnald Garre, MD,  Lebron Conners [phone]) on 05/28/2015 9:54:16 PM      MDM   Final diagnoses:  Peripheral edema   The patient present with lower extremity swelling. At this point time there is no evidence of CHF. The patient has clear breath sounds and chest x-ray does not show infiltrate or effusion or edema.    Arby Barrette, MD 06/04/15 [phone]

## 2015-05-28 NOTE — ED Notes (Signed)
Dr. Donnald Garre at bedside speaking with patient about xray results and having port-a-cath looked at to make sure it is in the right place before he has any kind of medication put into it.

## 2015-05-29 DIAGNOSIS — R2243 Localized swelling, mass and lump, lower limb, bilateral: Secondary | ICD-10-CM | POA: Diagnosis not present

## 2015-06-01 ENCOUNTER — Inpatient Hospital Stay: Payer: Medicare Other | Attending: Oncology

## 2015-06-01 VITALS — BP 110/60 | HR 55 | Temp 98.3°F | Resp 18

## 2015-06-01 DIAGNOSIS — Z95828 Presence of other vascular implants and grafts: Secondary | ICD-10-CM

## 2015-06-01 DIAGNOSIS — Z79899 Other long term (current) drug therapy: Secondary | ICD-10-CM | POA: Insufficient documentation

## 2015-06-01 DIAGNOSIS — G35 Multiple sclerosis: Secondary | ICD-10-CM

## 2015-06-01 MED ORDER — SODIUM CHLORIDE 0.9 % IJ SOLN
10.0000 mL | INTRAMUSCULAR | Status: DC | PRN
  Administered 2015-06-01: 10 mL via INTRAVENOUS
  Filled 2015-06-01: qty 10

## 2015-06-01 MED ORDER — HEPARIN SOD (PORK) LOCK FLUSH 100 UNIT/ML IV SOLN
500.0000 [IU] | Freq: Once | INTRAVENOUS | Status: AC
  Administered 2015-06-01: 500 [IU] via INTRAVENOUS
  Filled 2015-06-01: qty 5

## 2015-06-01 MED ORDER — SODIUM CHLORIDE 0.9 % IV SOLN
Freq: Once | INTRAVENOUS | Status: AC
  Administered 2015-06-01: 11:00:00 via INTRAVENOUS
  Filled 2015-06-01: qty 1000

## 2015-06-01 MED ORDER — NATALIZUMAB 300 MG/15ML IV CONC
300.0000 mg | Freq: Once | INTRAVENOUS | Status: AC
  Administered 2015-06-01: 300 mg via INTRAVENOUS
  Filled 2015-06-01: qty 15

## 2015-06-01 MED ORDER — ACETAMINOPHEN 500 MG PO TABS
1000.0000 mg | ORAL_TABLET | Freq: Once | ORAL | Status: AC
  Administered 2015-06-01: 1000 mg via ORAL
  Filled 2015-06-01: qty 2

## 2015-06-26 ENCOUNTER — Other Ambulatory Visit: Payer: Self-pay | Admitting: General Surgery

## 2015-06-29 ENCOUNTER — Inpatient Hospital Stay: Payer: Medicare Other | Attending: Oncology

## 2015-06-29 VITALS — BP 117/61 | HR 60 | Temp 97.8°F | Resp 18

## 2015-06-29 DIAGNOSIS — G35 Multiple sclerosis: Secondary | ICD-10-CM | POA: Diagnosis present

## 2015-06-29 DIAGNOSIS — Z79899 Other long term (current) drug therapy: Secondary | ICD-10-CM | POA: Diagnosis not present

## 2015-06-29 MED ORDER — SODIUM CHLORIDE 0.9 % IV SOLN
300.0000 mg | Freq: Once | INTRAVENOUS | Status: AC
  Administered 2015-06-29: 300 mg via INTRAVENOUS
  Filled 2015-06-29: qty 15

## 2015-06-29 MED ORDER — SODIUM CHLORIDE 0.9 % IV SOLN
Freq: Once | INTRAVENOUS | Status: AC
  Administered 2015-06-29: 10:00:00 via INTRAVENOUS
  Filled 2015-06-29: qty 1000

## 2015-06-29 MED ORDER — HEPARIN SOD (PORK) LOCK FLUSH 100 UNIT/ML IV SOLN
500.0000 [IU] | Freq: Once | INTRAVENOUS | Status: AC
  Administered 2015-06-29: 500 [IU] via INTRAVENOUS

## 2015-06-29 MED ORDER — ACETAMINOPHEN 500 MG PO TABS
1000.0000 mg | ORAL_TABLET | Freq: Once | ORAL | Status: AC
  Administered 2015-06-29: 1000 mg via ORAL
  Filled 2015-06-29: qty 2

## 2015-07-03 ENCOUNTER — Encounter (HOSPITAL_COMMUNITY)
Admission: RE | Admit: 2015-07-03 | Discharge: 2015-07-03 | Disposition: A | Discharge status: Home / Self Care | Payer: Medicare Other | Source: Ambulatory Visit | Attending: General Surgery | Admitting: General Surgery

## 2015-07-03 ENCOUNTER — Ambulatory Visit (HOSPITAL_COMMUNITY)
Admission: RE | Admit: 2015-07-03 | Discharge: 2015-07-03 | Disposition: A | Discharge status: Home / Self Care | Payer: Medicare Other | Source: Ambulatory Visit | Attending: General Surgery | Admitting: General Surgery

## 2015-07-03 ENCOUNTER — Encounter (HOSPITAL_COMMUNITY): Payer: Self-pay

## 2015-07-03 DIAGNOSIS — Z01812 Encounter for preprocedural laboratory examination: Secondary | ICD-10-CM | POA: Diagnosis not present

## 2015-07-03 DIAGNOSIS — M419 Scoliosis, unspecified: Secondary | ICD-10-CM | POA: Insufficient documentation

## 2015-07-03 DIAGNOSIS — Z01811 Encounter for preprocedural respiratory examination: Secondary | ICD-10-CM

## 2015-07-03 DIAGNOSIS — Z01818 Encounter for other preprocedural examination: Secondary | ICD-10-CM | POA: Diagnosis not present

## 2015-07-03 HISTORY — DX: Pneumonia, unspecified organism: J18.9

## 2015-07-03 HISTORY — DX: Pain in unspecified joint: M25.50

## 2015-07-03 HISTORY — DX: Effusion, unspecified joint: M25.40

## 2015-07-03 HISTORY — DX: Dorsalgia, unspecified: M54.9

## 2015-07-03 HISTORY — DX: Other specified postprocedural states: Z98.890

## 2015-07-03 HISTORY — DX: Other chronic pain: G89.29

## 2015-07-03 HISTORY — DX: Gastro-esophageal reflux disease without esophagitis: K21.9

## 2015-07-03 LAB — COMPREHENSIVE METABOLIC PANEL
ALBUMIN: 3.7 g/dL (ref 3.5–5.0)
ALK PHOS: 82 U/L (ref 38–126)
ALT: 29 U/L (ref 17–63)
AST: 24 U/L (ref 15–41)
Anion gap: 10 (ref 5–15)
BUN: 7 mg/dL (ref 6–20)
CALCIUM: 9.1 mg/dL (ref 8.9–10.3)
CHLORIDE: 111 mmol/L (ref 101–111)
CO2: 20 mmol/L — AB (ref 22–32)
CREATININE: 0.77 mg/dL (ref 0.61–1.24)
GFR calc Af Amer: >60 mL/min (ref >60)
GFR calc non Af Amer: >60 mL/min (ref >60)
GLUCOSE: 127 mg/dL — AB (ref 65–99)
Potassium: 3.5 mmol/L (ref 3.5–5.1)
SODIUM: 141 mmol/L (ref 135–145)
Total Bilirubin: 0.4 mg/dL (ref 0.3–1.2)
Total Protein: 7.1 g/dL (ref 6.5–8.1)

## 2015-07-03 LAB — CBC WITH DIFFERENTIAL/PLATELET
BASOS ABS: 0 10*3/uL (ref 0.0–0.1)
BASOS PCT: 1 % (ref 0–1)
EOS ABS: 0.2 10*3/uL (ref 0.0–0.7)
EOS PCT: 3 % (ref 0–5)
HCT: 41.2 % (ref 39.0–52.0)
HEMOGLOBIN: 13.7 g/dL (ref 13.0–17.0)
LYMPHS ABS: 2.9 10*3/uL (ref 0.7–4.0)
Lymphocytes Relative: 46 % (ref 12–46)
MCH: 27.7 pg (ref 26.0–34.0)
MCHC: 33.3 g/dL (ref 30.0–36.0)
MCV: 83.2 fL (ref 78.0–100.0)
Monocytes Absolute: 0.5 10*3/uL (ref 0.1–1.0)
Monocytes Relative: 7 % (ref 3–12)
NEUTROS PCT: 43 % (ref 43–77)
Neutro Abs: 2.7 10*3/uL (ref 1.7–7.7)
PLATELETS: 209 10*3/uL (ref 150–400)
RBC: 4.95 MIL/uL (ref 4.22–5.81)
RDW: 15.6 % — ABNORMAL HIGH (ref 11.5–15.5)
WBC: 6.3 10*3/uL (ref 4.0–10.5)

## 2015-07-03 LAB — PROTIME-INR
INR: 1.34 (ref 0.00–1.49)
Prothrombin Time: 16.7 seconds — ABNORMAL HIGH (ref 11.6–15.2)

## 2015-07-03 LAB — APTT: APTT: 26 s (ref 24–37)

## 2015-07-03 MED ORDER — CHLORHEXIDINE GLUCONATE 4 % EX LIQD: 1.0000 "application " | Freq: Once | CUTANEOUS | Status: DC

## 2015-07-03 NOTE — Progress Notes (Signed)
   07/03/15 1523  OBSTRUCTIVE SLEEP APNEA  Have you ever been diagnosed with sleep apnea through a sleep study? No  Do you snore loudly (loud enough to be heard through closed doors)?  0  Do you often feel tired, fatigued, or sleepy during the daytime? 0  Has anyone observed you stop breathing during your sleep? 0  Do you have, or are you being treated for high blood pressure? 1  BMI more than 35 kg/m2? 0  Age over 54 years old? 1  Neck circumference greater than 40 cm/16 inches? 1 (17)  Gender: 1

## 2015-07-03 NOTE — Pre-Procedure Instructions (Signed)
[name]  07/03/2015      [id number] [place], [place] - [address] [place] [address] Phone: [phone] Fax: [phone]  [facility] [place], [place] - [address] [address] [facility] [address] [place] [address] [place] [address] Phone: [phone] Fax: [phone]  [facility] [place] - [address] [place] [address] Phone: [phone] Fax: [phone]    Your procedure is scheduled on Fri, Aug 26 @ 12:00 PM  Report to [facility] Admitting at 10:00 AM.  Call this number if you have problems the morning of surgery:  [phone]   Remember:  Do not eat food or drink liquids after midnight.  Take these medicines the morning of surgery with A SIP OF WATER Albuterol<Bring Your Inhaler With You>,Baclofen(Lioresal),Ampyra(Dalfampridine),Gabapentin(Neurontin),Keppra(Levetiracetam),Omeprazole(Prilosec),Trileptal(Oxcarbazepine),Pain Pill(if needed),Prednisone(Deltasone),Zoloft(Sertraline),and Topamax(Topiramate)               Hold your Coumadin if you have been instructed to do so. No Goody's,BC's,Aleve,Aspirin,Ibuprofen,Fish Oil,or any Herbal Medications.    Do not wear jewelry.  Do not wear lotions, powders, or colognes.  You may wear deodorant.            Men may shave face and neck.  Do not bring valuables to the hospital.  [facility] is not responsible for any belongings or valuables.  Contacts, dentures or bridgework may not be worn into surgery.  Leave your suitcase in the car.  After surgery it may be brought to your room.  For patients admitted to the hospital, discharge time will be determined by your treatment team.  Patients discharged the day of surgery will not be allowed to drive home.    Special instructions:  [place] - Preparing for Surgery  Before surgery, you can play an important role.  Because skin is not sterile, your  skin needs to be as free of germs as possible.  You can reduce the number of germs on you skin by washing with CHG (chlorahexidine gluconate) soap before surgery.  CHG is an antiseptic cleaner which kills germs and bonds with the skin to continue killing germs even after washing.  Please DO NOT use if you have an allergy to CHG or antibacterial soaps.  If your skin becomes reddened/irritated stop using the CHG and inform your nurse when you arrive at Short Stay.  Do not shave (including legs and underarms) for at least 48 hours prior to the first CHG shower.  You may shave your face.  Please follow these instructions carefully:   1.  Shower with CHG Soap the night before surgery and the                                morning of Surgery.  2.  If you choose to wash your hair, wash your hair first as usual with your       normal shampoo.  3.  After you shampoo, rinse your hair and body thoroughly to remove the                      Shampoo.  4.  Use CHG as you would any other liquid soap.  You can apply chg directly       to the skin and wash gently with scrungie or a clean washcloth.  5.  Apply the CHG Soap to your body ONLY  FROM THE NECK DOWN.        Do not use on open wounds or open sores.  Avoid contact with your eyes,       ears, mouth and genitals (private parts).  Wash genitals (private parts)       with your normal soap.  6.  Wash thoroughly, paying special attention to the area where your surgery        will be performed.  7.  Thoroughly rinse your body with warm water from the neck down.  8.  DO NOT shower/wash with your normal soap after using and rinsing off       the CHG Soap.  9.  Pat yourself dry with a clean towel.            10.  Wear clean pajamas.            11.  Place clean sheets on your bed the night of your first shower and do not        sleep with pets.  Day of Surgery  Do not apply any lotions/deoderants the morning of surgery.  Please wear clean clothes to the  hospital/surgery center.    Please read over the following fact sheets that you were given. Pain Booklet, Coughing and Deep Breathing and Surgical Site Infection Prevention

## 2015-07-03 NOTE — Progress Notes (Addendum)
Medical Md is Dr.Edwin Avbuere  Cardiologist denies having one  EKG in epic from 05-28-15  Echo   Stress test denies ever having one  Heart cath denies ever having one

## 2015-07-05 MED ORDER — CEFAZOLIN SODIUM-DEXTROSE 2-3 GM-% IV SOLR
2.0000 g | INTRAVENOUS | Status: AC
  Administered 2015-07-06: 2 g via INTRAVENOUS
  Filled 2015-07-05: qty 50

## 2015-07-05 NOTE — H&P (Signed)
[name]  Location: [facility] Patient #: [id number] DOB: [birth year] Divorced / Language: English / Race: Black or African American Male        History of Present Illness  Patient words: discuss PAC removal.  The patient is a 54 year old male who presents with a complaint of port-a-cath malfunction. This is a pleasant 54 year old African American male with a history of multiple sclerosis, pulmonary embolism, hypertension, chronic pain syndrome, depression. He is dependent on Tysabri infusions which are supervised by Dr. Harriette Bouillon at [facility] neurology, phone [phone]. He is anticoagulated on Coumadin. He is in a wheelchair. In no distress today. Pleasant and cooperative with good understanding.   Recent chest x-ray showed that his right-sided portacatheter was coiled up in the right subclavian vein. He denies headache. Swelling in the neck or arm. Or pain. I reviewed my previous operation and insertion was uneventful and postop films looked good. The catheter has been functioning well. Because of risk of thrombosis and vascular complications he is referred for consideration of revision of his Port-A-Cath. I discussed this with his neurologist who says he is dependent on these infusions.    I discussed the indications, details, techniques and numerous risk of Port-A-Cath revision with him. I told that we might have to take this port completely out and put it somewhere else such as the right neck or left subclavian. He is aware of the risk of bleeding, infection, inability to insert the catheter, thrombosis, pneumothorax, air embolism, and other problems. He understands all these issues well. At this time all his questions were answered. He is in full agreement with this plan. We will contact his PCP, Dr. Concepcion Elk, as we did last time who will manage his Lovenox bridge. We'll have to stop his Coumadin 5 days preop.   Allergies No Known  Drug Allergies07/20/2016  Medication History  Eszopiclone (  Tablet, Oral) Active. TraMADol HCl (  Tablet, Oral as needed) Active. Ampyra (  Tablet ER 12HR, Oral) Active. Gabapentin (  Tablet, Oral) Active. Lisinopril-Hydrochlorothiazide (10-12.5MG  Tablet, Oral) Active. Omeprazole (  Capsule DR, Oral) Active. Sertraline HCl (  Tablet, Oral) Active. Topiramate (  Tablet, Oral) Active. Triamterene-HCTZ (37.5-25MG  Tablet, Oral) Active. Warfarin Sodium (  Tablet, Oral) Active. Verapamil HCl (  Tablet, Oral) Active. OXcarbazepine (  Tablet, Oral) Active. Oxycodone-Acetaminophen (10-325MG  Tablet, Oral as needed) Active. Medications Reconciled  Vitals   Weight: 192 lb Height: 68in Body Surface Area: 2.04 m Body Mass Index: 29.19 kg/m Temp.: 39F(Oral)  Pulse: 63 (Regular)  Resp.: 17 (Unlabored)  BP: 136/82 (Sitting, Left Arm, Standard)    Physical Exam General Note: Alert. Pleasant. Cooperative. No distress. Good insight. In a wheelchair.   Head and Neck Note: Neck reveals no adenopathy or mass. No scars. Clavicles feel normal.   Chest and Lung Exam Note: Port palpable right infraclavicular area. No sign of infection or bleeding. Nontender.   Cardiovascular Note: Regular rate and rhythm. No murmur. No ectopy.     Assessment & Plan  ENCOUNTER FOR CARE RELATED TO PORT-A-CATH (V58.81  Z45.2)   Schedule for Surgery Your port catheter has retracted and is now coiled up in your right subclavian vein. That is unacceptable and increases your risk for blood clots and other vascular complications I have discussed your care with Dr. Riley Lam Jeffrey(Neurology) and he states that you will need IV access for your tysabri infusions You will be scheduled for revision of the Port-A-Cath under general anesthesia in the near future. You will need to stop your Coumadin a full  5 days prior to the surgery We will ask Dr.  Concepcion Elk to manage the Lovenox shots and blood thinner bridge both preop and postop We have discussed techniques, and numerous risk of this procedure in detail. You are aware that we may have to put the catheter in the neck or on the left side. Please read the information that we have printed out for you  Pt Education - ccs port insertion education   MULTIPLE SCLEROSIS (340  G35) HISTORY OF PULMONARY EMBOLISM (V12.55  Z86.711) ANTICOAGULATED ON COUMADIN (V58.83  Z51.81) DEPRESSION, CONTROLLED (311  F32.9) HYPERTENSION, BENIGN (401.1  I10)    Jadene Stemmer M. Derrell Lolling, M.D., [facility] General and Minimally invasive Surgery Breast and Colorectal Surgery Office:   [phone] Pager:   [phone]

## 2015-07-06 ENCOUNTER — Ambulatory Visit (HOSPITAL_COMMUNITY): Payer: Medicare Other | Admitting: Certified Registered"

## 2015-07-06 ENCOUNTER — Encounter (HOSPITAL_COMMUNITY): Payer: Self-pay | Admitting: Certified Registered"

## 2015-07-06 ENCOUNTER — Ambulatory Visit (HOSPITAL_COMMUNITY)
Admission: RE | Admit: 2015-07-06 | Discharge: 2015-07-06 | Disposition: A | Discharge status: Home / Self Care | Payer: Medicare Other | Source: Ambulatory Visit | Attending: General Surgery | Admitting: General Surgery

## 2015-07-06 ENCOUNTER — Ambulatory Visit (HOSPITAL_COMMUNITY): Payer: Medicare Other

## 2015-07-06 ENCOUNTER — Encounter (HOSPITAL_COMMUNITY)
Admission: RE | Disposition: A | Discharge status: Home / Self Care | Payer: Self-pay | Source: Ambulatory Visit | Attending: General Surgery

## 2015-07-06 DIAGNOSIS — G500 Trigeminal neuralgia: Secondary | ICD-10-CM | POA: Diagnosis not present

## 2015-07-06 DIAGNOSIS — G894 Chronic pain syndrome: Secondary | ICD-10-CM | POA: Diagnosis not present

## 2015-07-06 DIAGNOSIS — I10 Essential (primary) hypertension: Secondary | ICD-10-CM | POA: Diagnosis not present

## 2015-07-06 DIAGNOSIS — M1990 Unspecified osteoarthritis, unspecified site: Secondary | ICD-10-CM | POA: Diagnosis not present

## 2015-07-06 DIAGNOSIS — Z7901 Long term (current) use of anticoagulants: Secondary | ICD-10-CM | POA: Insufficient documentation

## 2015-07-06 DIAGNOSIS — Z95828 Presence of other vascular implants and grafts: Secondary | ICD-10-CM

## 2015-07-06 DIAGNOSIS — Z452 Encounter for adjustment and management of vascular access device: Secondary | ICD-10-CM | POA: Insufficient documentation

## 2015-07-06 DIAGNOSIS — G35D Multiple sclerosis, unspecified: Secondary | ICD-10-CM | POA: Diagnosis present

## 2015-07-06 DIAGNOSIS — I2782 Chronic pulmonary embolism: Secondary | ICD-10-CM | POA: Insufficient documentation

## 2015-07-06 DIAGNOSIS — G35 Multiple sclerosis: Secondary | ICD-10-CM | POA: Diagnosis not present

## 2015-07-06 DIAGNOSIS — F329 Major depressive disorder, single episode, unspecified: Secondary | ICD-10-CM | POA: Diagnosis not present

## 2015-07-06 HISTORY — PX: PORT A CATH REVISION: SHX6033

## 2015-07-06 LAB — PROTIME-INR
INR: 1.22 (ref 0.00–1.49)
Prothrombin Time: 15.5 seconds — ABNORMAL HIGH (ref 11.6–15.2)

## 2015-07-06 SURGERY — REVISION, PORT-A-CATH INSERTION
Anesthesia: General | Site: Chest

## 2015-07-06 MED ORDER — LIDOCAINE HCL (CARDIAC) 20 MG/ML IV SOLN
INTRAVENOUS | Status: AC
  Filled 2015-07-06: qty 10

## 2015-07-06 MED ORDER — MEPERIDINE HCL 25 MG/ML IJ SOLN: 6.2500 mg | INTRAMUSCULAR | Status: DC | PRN

## 2015-07-06 MED ORDER — OXYCODONE-ACETAMINOPHEN 5-325 MG PO TABS
1.0000 | ORAL_TABLET | Freq: Once | ORAL | Status: AC
  Administered 2015-07-06: 1 via ORAL

## 2015-07-06 MED ORDER — HEPARIN SOD (PORK) LOCK FLUSH 100 UNIT/ML IV SOLN
INTRAVENOUS | Status: AC
  Filled 2015-07-06: qty 5

## 2015-07-06 MED ORDER — MIDAZOLAM HCL 5 MG/5ML IJ SOLN
INTRAMUSCULAR | Status: DC | PRN
  Administered 2015-07-06: 2 mg via INTRAVENOUS

## 2015-07-06 MED ORDER — PHENYLEPHRINE HCL 10 MG/ML IJ SOLN
INTRAMUSCULAR | Status: DC | PRN
  Administered 2015-07-06: 80 ug via INTRAVENOUS

## 2015-07-06 MED ORDER — OXYCODONE-ACETAMINOPHEN 5-325 MG PO TABS
ORAL_TABLET | ORAL | Status: DC
Start: 2015-07-06 — End: 2015-07-06
  Filled 2015-07-06: qty 1

## 2015-07-06 MED ORDER — 0.9 % SODIUM CHLORIDE (POUR BTL) OPTIME
TOPICAL | Status: DC | PRN
  Administered 2015-07-06: 1000 mL

## 2015-07-06 MED ORDER — LIDOCAINE-EPINEPHRINE (PF) 1 %-1:200000 IJ SOLN
INTRAMUSCULAR | Status: AC
  Filled 2015-07-06: qty 30

## 2015-07-06 MED ORDER — FENTANYL CITRATE (PF) 100 MCG/2ML IJ SOLN
INTRAMUSCULAR | Status: DC
Start: 2015-07-06 — End: 2015-07-06
  Filled 2015-07-06: qty 2

## 2015-07-06 MED ORDER — HYDROCODONE-ACETAMINOPHEN 5-325 MG PO TABS: 1.0000 | ORAL_TABLET | Freq: Four times a day (QID) | ORAL | Status: DC | PRN

## 2015-07-06 MED ORDER — PROPOFOL 10 MG/ML IV BOLUS
INTRAVENOUS | Status: DC | PRN
  Administered 2015-07-06: 120 mg via INTRAVENOUS
  Administered 2015-07-06: 80 mg via INTRAVENOUS

## 2015-07-06 MED ORDER — LIDOCAINE-EPINEPHRINE (PF) 1 %-1:200000 IJ SOLN
INTRAMUSCULAR | Status: DC | PRN
  Administered 2015-07-06: 3 mL

## 2015-07-06 MED ORDER — LACTATED RINGERS IV SOLN
INTRAVENOUS | Status: DC
  Administered 2015-07-06: 11:00:00 via INTRAVENOUS

## 2015-07-06 MED ORDER — MIDAZOLAM HCL 2 MG/2ML IJ SOLN
INTRAMUSCULAR | Status: AC
  Filled 2015-07-06: qty 2

## 2015-07-06 MED ORDER — FENTANYL CITRATE (PF) 100 MCG/2ML IJ SOLN
25.0000 ug | INTRAMUSCULAR | Status: DC | PRN
  Administered 2015-07-06 (×2): 25 ug via INTRAVENOUS
  Administered 2015-07-06: 50 ug via INTRAVENOUS

## 2015-07-06 MED ORDER — IOHEXOL 300 MG/ML  SOLN
INTRAMUSCULAR | Status: DC | PRN
  Administered 2015-07-06: 50 mL

## 2015-07-06 MED ORDER — PROMETHAZINE HCL 25 MG/ML IJ SOLN: 6.2500 mg | INTRAMUSCULAR | Status: DC | PRN

## 2015-07-06 MED ORDER — MIDAZOLAM HCL 2 MG/2ML IJ SOLN
0.5000 mg | Freq: Once | INTRAMUSCULAR | Status: DC | PRN
Start: 2015-07-06 — End: 2015-07-06

## 2015-07-06 MED ORDER — ONDANSETRON HCL 4 MG/2ML IJ SOLN
INTRAMUSCULAR | Status: AC
  Filled 2015-07-06: qty 2

## 2015-07-06 MED ORDER — ONDANSETRON HCL 4 MG/2ML IJ SOLN
INTRAMUSCULAR | Status: DC | PRN
  Administered 2015-07-06: 4 mg via INTRAVENOUS

## 2015-07-06 MED ORDER — HEPARIN SOD (PORK) LOCK FLUSH 100 UNIT/ML IV SOLN
INTRAVENOUS | Status: DC | PRN
  Administered 2015-07-06: 500 [IU] via INTRAVENOUS

## 2015-07-06 MED ORDER — SODIUM CHLORIDE 0.9 % IR SOLN
Status: DC | PRN
  Administered 2015-07-06: 12:00:00

## 2015-07-06 MED ORDER — PROPOFOL 10 MG/ML IV BOLUS
INTRAVENOUS | Status: AC
  Filled 2015-07-06: qty 20

## 2015-07-06 MED ORDER — LIDOCAINE HCL (CARDIAC) 20 MG/ML IV SOLN
INTRAVENOUS | Status: DC | PRN
  Administered 2015-07-06: 80 mg via INTRAVENOUS
  Administered 2015-07-06: 20 mg via INTRAVENOUS

## 2015-07-06 MED ORDER — FENTANYL CITRATE (PF) 250 MCG/5ML IJ SOLN
INTRAMUSCULAR | Status: AC
  Filled 2015-07-06: qty 5

## 2015-07-06 SURGICAL SUPPLY — 50 items
BAG DECANTER FOR FLEXI CONT (MISCELLANEOUS) ×3 IMPLANT
BLADE SURG ROTATE 9660 (MISCELLANEOUS) IMPLANT
CANISTER SUCTION 2500CC (MISCELLANEOUS) IMPLANT
CHLORAPREP W/TINT 26ML (MISCELLANEOUS) ×3 IMPLANT
COVER PROBE W GEL 5X96 (DRAPES) ×3 IMPLANT
COVER SURGICAL LIGHT HANDLE (MISCELLANEOUS) ×3 IMPLANT
CRADLE DONUT ADULT HEAD (MISCELLANEOUS) ×3 IMPLANT
DECANTER SPIKE VIAL GLASS SM (MISCELLANEOUS) ×3 IMPLANT
DERMABOND ADVANCED (GAUZE/BANDAGES/DRESSINGS) ×2
DERMABOND ADVANCED .7 DNX12 (GAUZE/BANDAGES/DRESSINGS) ×1 IMPLANT
DRAPE C-ARM 42X72 X-RAY (DRAPES) ×3 IMPLANT
DRAPE LAPAROSCOPIC ABDOMINAL (DRAPES) ×3 IMPLANT
DRAPE UTILITY XL STRL (DRAPES) ×6 IMPLANT
ELECT CAUTERY BLADE 6.4 (BLADE) ×3 IMPLANT
ELECT REM PT RETURN 9FT ADLT (ELECTROSURGICAL) ×3
ELECTRODE REM PT RTRN 9FT ADLT (ELECTROSURGICAL) ×1 IMPLANT
GAUZE SPONGE 4X4 16PLY XRAY LF (GAUZE/BANDAGES/DRESSINGS) ×3 IMPLANT
GLOVE EUDERMIC 7 POWDERFREE (GLOVE) ×3 IMPLANT
GOWN STRL REUS W/ TWL LRG LVL3 (GOWN DISPOSABLE) ×1 IMPLANT
GOWN STRL REUS W/ TWL XL LVL3 (GOWN DISPOSABLE) ×1 IMPLANT
GOWN STRL REUS W/TWL LRG LVL3 (GOWN DISPOSABLE) ×2
GOWN STRL REUS W/TWL XL LVL3 (GOWN DISPOSABLE) ×2
INTRODUCER 13FR (MISCELLANEOUS) IMPLANT
INTRODUCER COOK 11FR (CATHETERS) IMPLANT
KIT BASIN OR (CUSTOM PROCEDURE TRAY) ×3 IMPLANT
KIT PORT POWER 8FR ISP CVUE (Catheter) ×3 IMPLANT
KIT ROOM TURNOVER OR (KITS) ×3 IMPLANT
NEEDLE HYPO 25GX1X1/2 BEV (NEEDLE) ×6 IMPLANT
NS IRRIG 1000ML POUR BTL (IV SOLUTION) ×3 IMPLANT
PACK SURGICAL SETUP 50X90 (CUSTOM PROCEDURE TRAY) ×3 IMPLANT
PAD ARMBOARD 7.5X6 YLW CONV (MISCELLANEOUS) ×3 IMPLANT
PENCIL BUTTON HOLSTER BLD 10FT (ELECTRODE) ×3 IMPLANT
POWERPORT CLEARVUE ×3 IMPLANT
SET INTRODUCER 12FR PACEMAKER (SHEATH) IMPLANT
SET SHEATH INTRODUCER 10FR (MISCELLANEOUS) IMPLANT
SHEATH COOK PEEL AWAY SET 9F (SHEATH) IMPLANT
SURGILUBE 3G PEEL PACK STRL (MISCELLANEOUS) ×6 IMPLANT
SUT MNCRL AB 4-0 PS2 18 (SUTURE) ×3 IMPLANT
SUT PROLENE 2 0 CT2 30 (SUTURE) ×6 IMPLANT
SUT VIC AB 3-0 SH 18 (SUTURE) ×3 IMPLANT
SYR 20ML ECCENTRIC (SYRINGE) ×6 IMPLANT
SYR 5ML LUER SLIP (SYRINGE) ×3 IMPLANT
SYR BULB 3OZ (MISCELLANEOUS) ×3 IMPLANT
SYR CONTROL 10ML LL (SYRINGE) ×3 IMPLANT
SYRINGE 10CC LL (SYRINGE) ×3 IMPLANT
TOWEL OR 17X24 6PK STRL BLUE (TOWEL DISPOSABLE) ×6 IMPLANT
TOWEL OR 17X26 10 PK STRL BLUE (TOWEL DISPOSABLE) IMPLANT
TUBE CONNECTING 12'X1/4 (SUCTIONS)
TUBE CONNECTING 12X1/4 (SUCTIONS) IMPLANT
YANKAUER SUCT BULB TIP NO VENT (SUCTIONS) ×3 IMPLANT

## 2015-07-06 NOTE — Interval H&P Note (Signed)
History and Physical Interval Note:  07/06/2015 11:10 AM  [name]  has presented today for surgery, with the diagnosis of multiple scleoris   The various methods of treatment have been discussed with the patient and family. After consideration of risks, benefits and other options for treatment, the patient has consented to  Procedure(s): PORT A CATH REVISION WITH ULTRA SOUND (N/A) as a surgical intervention .  The patient's history has been reviewed, patient examined, no change in status, stable for surgery.  I have reviewed the patient's chart and labs.  Questions were answered to the patient's satisfaction.     [name]

## 2015-07-06 NOTE — Transfer of Care (Signed)
Immediate Anesthesia Transfer of Care Note  Patient: [name]  Procedure(s) Performed: Procedure(s): PORT A CATH REVISION WITH ULTRA SOUND (N/A)  Patient Location: PACU  Anesthesia Type:General  Level of Consciousness: awake, oriented and patient cooperative  Airway & Oxygen Therapy: Patient Spontanous Breathing and Patient connected to nasal cannula oxygen  Post-op Assessment: Report given to RN, Post -op Vital signs reviewed and stable and Patient moving all extremities  Post vital signs: Reviewed and stable  Last Vitals:  Filed Vitals:   07/06/15 1008  BP: 127/72  Pulse: 70  Temp: 36.3 C  Resp: 20    Complications: No apparent anesthesia complications

## 2015-07-06 NOTE — Anesthesia Procedure Notes (Signed)
Procedure Name: LMA Insertion Date/Time: 07/06/2015 12:05 PM Performed by: Charm Barges, Obera Stauch R Pre-anesthesia Checklist: Patient identified, Emergency Drugs available, Suction available, Patient being monitored and Timeout performed Patient Re-evaluated:Patient Re-evaluated prior to inductionOxygen Delivery Method: Circle system utilized Preoxygenation: Pre-oxygenation with 100% oxygen Intubation Type: IV induction Ventilation: Mask ventilation without difficulty LMA: LMA inserted LMA Size: 5.0 Number of attempts: 1 Placement Confirmation: positive ETCO2 and breath sounds checked- equal and bilateral Tube secured with: Tape Dental Injury: Teeth and Oropharynx as per pre-operative assessment

## 2015-07-06 NOTE — Anesthesia Preprocedure Evaluation (Addendum)
Anesthesia Evaluation  Patient identified by MRN, date of birth, ID band Patient awake    Reviewed: Allergy & Precautions, NPO status , Patient's Chart, lab work & pertinent test results  History of Anesthesia Complications Negative for: history of anesthetic complications  Airway Mallampati: I  TM Distance: >3 FB Neck ROM: Full    Dental  (+) Poor Dentition, Dental Advisory Given   Pulmonary neg pulmonary ROS, Current Smoker,  breath sounds clear to auscultation        Cardiovascular hypertension, Pt. on medications - anginaDVT Rhythm:Regular Rate:Normal     Neuro/Psych Anxiety Depression Multiple sclerosis Vertigo Trigeminal neuralgia  Neuromuscular disease    GI/Hepatic GERD-  Medicated and Controlled,Elevated LFTs   Endo/Other  negative endocrine ROS  Renal/GU negative Renal ROS     Musculoskeletal  (+) Arthritis -,   Abdominal   Peds  Hematology  (+) Blood dyscrasia (coumadin/lovenox: INR 1.34), ,   Anesthesia Other Findings Patient has MS - arms and legs numbness, vision changes, last was 2 months ago, took prednisone then, none recently.. Does take tysabri for maintenance  Reproductive/Obstetrics                           Anesthesia Physical Anesthesia Plan  ASA: III  Anesthesia Plan: General   Post-op Pain Management:    Induction: Intravenous  Airway Management Planned: LMA  Additional Equipment:   Intra-op Plan:   Post-operative Plan: Extubation in OR  Informed Consent: I have reviewed the patients History and Physical, chart, labs and discussed the procedure including the risks, benefits and alternatives for the proposed anesthesia with the patient or authorized representative who has indicated his/her understanding and acceptance.   Dental advisory given  Plan Discussed with: Anesthesiologist, CRNA and Surgeon  Anesthesia Plan Comments: (Plan routine monitors,  GA- LMA OK)       Anesthesia Quick Evaluation

## 2015-07-06 NOTE — Op Note (Signed)
Patient Name:           [name]   Date of Surgery:        07/06/2015  Pre op Diagnosis:      Port-A-Cath malfunction  Post op Diagnosis:    Same  Procedure:                 Removal of old Port-A-Cath                                       Insertion of new power port ClearVue 8 French venous vascular access device, tunneled                                       Use of fluoroscopy for guidance and positioning                                       Use of ultrasound for venipuncture guidance and confirmation.  Surgeon:                     Angelia Mould. Derrell Lolling, M.D., FACS  Assistant:                      Magnus Ivan, RNFA  Operative Indications:   The patient is a 54 year old male who presents with a complaint of port-a-cath malfunction. This is a pleasant 54 year old African American male with a history of multiple sclerosis, pulmonary embolism, hypertension, chronic pain syndrome, depression. He is dependent on Tysabri infusions which are supervised by Dr. Harriette Bouillon at [facility], phone [phone]. He is anticoagulated on Coumadin. He is in a wheelchair. In no distress. Pleasant and cooperative with good understanding.  Recent chest x-ray showed that his right-sided portacatheter was coiled up in the right subclavian vein. He denies headache,  Sselling in the neck or arm, or pain. I reviewed my previous operation and insertion was uneventful and postop films looked good. The catheter has been functioning well. Because of risk of thrombosis and vascular complications he is referred for consideration of revision of his Port-A-Cath. I discussed this with his neurologist who says he is dependent on these infusions.  I discussed the indications, details, techniques and numerous risk of Port-A-Cath revision with him. I told that we might have to take this port completely out and put it somewhere else such as the right neck or left subclavian. He is aware of the  risk of bleeding, infection, inability to insert the catheter, thrombosis, pneumothorax, air embolism, and other problems. He understands all these issues well. At this time all his questions were answered. He is in full agreement with this plan. He stopped his Coumadin 5 days ago.  He's been on a Lovenox bridge.  His prothrombin time is normal this morning.  Operative Findings:       I was able to access the right internal jugular vein and place the catheter through the internal jugular vein into the superior vena cava at the right atrial junction.  I removed the old port and there was no evidence of bleeding or infection.  It appeared intact.  I used the right infraclavicular port site to place the new port.  The port and catheter flushed easily and had excellent blood return.  Procedure in Detail:          Following the induction of general LMA anesthesia the patient was positioned with his arms tucked at his sides and a roll behind his shoulders.  The neck was extended and slightly turned to the left.  The neck and chest were prepped and draped in a sterile fashion.  Intravenous antibiotic's were given.  Surgical timeout was performed.  0.5% Marcaine with epinephrine was used as local infiltration anesthesia.     Using the ultrasound I identified the right internal jugular vein in the typical location between the heads of the sternocleidomastoid muscle.  While visualizing the vein I made a venipuncture and threaded the guidewire into the internal jugular vein and down into the central venous circulation.  C-arm fluoroscopy was then utilized to confirm the catheter was in good position in the SVC.   Using the C-arm I marked template on the chest wall to help measured and position the catheter so that it the catheter tip would be in the SVC near the right atrium.     I then made a transverse incision in the right infraclavicular area, through the old scar over the old port.  I then dissected the  port away from its capsule.  I cut and removed all 3 proline sutures and remove the port and catheter intact.  Did not appear to be any bleeding or infection.  Using a tunneling device I passed the new catheter from the right neck wire insertion incision to the right infraclavicular port pocket site.  I secured the catheter to the port with the locking device and flushed the port and catheter with heparinized saline.  The port was sutured back to the pectoralis fascia with 3 interrupted sutures of 2-0 proline.  The port and catheter flushed easily.  I then passed the dilator and peel-away sheath assembly over the guidewire into the central venous circulation, removed the dilator and the wire and threaded the catheter through the peel-away sheath and removed the peel-away sheath.  Catheter flushed easily and had excellent blood return.  Fluoroscopy confirmed good positioning of the catheter tip in the SVC  and no deformity of the catheter.  The port and catheter were then flushed with concentrated heparin.  The subcutaneous tissue at the port site was closed with 5 interrupted sutures of 3-0 Vicryl.  The skin incisions were closed with subcuticular sutures of 4-0 Monocryl and Dermabond.  The patient tolerated the procedure well and will be taken to PACU in stable condition.  A chest x-ray will be obtained.  EBL 10 mL.  Counts correct.  Complications none.     Angelia Mould. Derrell Lolling, M.D., FACS General and Minimally Invasive Surgery Breast and Colorectal Surgery  07/06/2015 12:49 PM

## 2015-07-06 NOTE — Progress Notes (Signed)
Pt. Reports that he gave complete medicine list to pharm. Tech. At PAT appt. Pt. Difficult in giving medicine history today & took several meds that he was not  Instructed to take DOS.

## 2015-07-06 NOTE — Anesthesia Postprocedure Evaluation (Signed)
  Anesthesia Post-op Note  Patient: [name]  Procedure(s) Performed: Procedure(s): Removal and replacement of PORT A CATH (N/A)  Patient Location: PACU  Anesthesia Type:General  Level of Consciousness: awake, alert , oriented and patient cooperative  Airway and Oxygen Therapy: Patient Spontanous Breathing  Post-op Pain: mild  Post-op Assessment: Post-op Vital signs reviewed, Patient's Cardiovascular Status Stable, Respiratory Function Stable, Patent Airway, No signs of Nausea or vomiting and Pain level controlled              Post-op Vital Signs: Reviewed and stable  Last Vitals:  Filed Vitals:   07/06/15 1403  BP: 102/70  Pulse: 62  Temp:   Resp: 16    Complications: No apparent anesthesia complications

## 2015-07-06 NOTE — Discharge Instructions (Signed)
° ° °  PORT-A-CATH: POST OP INSTRUCTIONS  Always review your discharge instruction sheet given to you by the facility where your surgery was performed.   1. A prescription for pain medication may be given to you upon discharge. Take your pain medication as prescribed, if needed. If narcotic pain medicine is not needed, then you make take acetaminophen (Tylenol) or ibuprofen (Advil) as needed.  2. Take your usually prescribed medications unless otherwise directed. 3. If you need a refill on your pain medication, please contact our office. All narcotic pain medicine now requires a paper prescription.  Phoned in and fax refills are no longer allowed by law.  Prescriptions will not be filled after 5 pm or on weekends.  4. You should follow a light diet for the remainder of the day after your procedure. 5. Most patients will experience some mild swelling and/or bruising in the area of the incision. It may take several days to resolve. 6. It is common to experience some constipation if taking pain medication after surgery. Increasing fluid intake and taking a stool softener (such as Colace) will usually help or prevent this problem from occurring. A mild laxative (Milk of Magnesia or Miralax) should be taken according to package directions if there are no bowel movements after 48 hours.  7. Unless discharge instructions indicate otherwise, you may remove your bandages 48 hours after surgery, and you may shower at that time. You may have steri-strips (small white skin tapes) in place directly over the incision.  These strips should be left on the skin for 7-10 days.  If your surgeon used Dermabond (skin glue) on the incision, you may shower in 24 hours.  The glue will flake off over the next 2-3 weeks.  8. If your port is left accessed at the end of surgery (needle left in port), the dressing cannot get wet and should only by changed by a healthcare professional. When the port is no longer accessed (when the  needle has been removed), follow step 7.   9. ACTIVITIES:  Limit activity involving your arms for the next 72 hours. Do no strenuous exercise or activity for 1 week. You may drive when you are no longer taking prescription pain medication, you can comfortably wear a seatbelt, and you can maneuver your car. 10.You may need to see your doctor in the office for a follow-up appointment.  Please       check with your doctor.  11.When you receive a new Port-a-Cath, you will get a product guide and        ID card.  Please keep them in case you need them.  WHEN TO CALL YOUR DOCTOR [phone]): 1. Fever over 101.0 2. Chills 3. Continued bleeding from incision 4. Increased redness and tenderness at the site 5. Shortness of breath, difficulty breathing   The clinic staff is available to answer your questions during regular business hours. Please dont hesitate to call and ask to speak to one of the nurses or medical assistants for clinical concerns. If you have a medical emergency, go to the nearest emergency room or call 911.  A surgeon from [facility] is always on call at the hospital.     For further information, please visit [email or web address]    Resume Lovenox shots tomorrow Resume Coumadin tomorrow Dr. Concepcion Elk will advise you about dosages of your anticoagulation medication Your primary physician, Dr. Concepcion Elk will advise you as to dosages

## 2015-07-09 ENCOUNTER — Encounter (HOSPITAL_COMMUNITY): Payer: Self-pay | Admitting: General Surgery

## 2015-07-27 ENCOUNTER — Inpatient Hospital Stay: Payer: Medicare Other | Attending: Oncology

## 2015-07-27 VITALS — BP 89/51 | HR 60 | Temp 96.8°F | Resp 18

## 2015-07-27 DIAGNOSIS — Z79899 Other long term (current) drug therapy: Secondary | ICD-10-CM | POA: Diagnosis not present

## 2015-07-27 DIAGNOSIS — G35 Multiple sclerosis: Secondary | ICD-10-CM | POA: Insufficient documentation

## 2015-07-27 MED ORDER — HEPARIN SOD (PORK) LOCK FLUSH 100 UNIT/ML IV SOLN
500.0000 [IU] | Freq: Once | INTRAVENOUS | Status: AC
  Administered 2015-07-27: 500 [IU]
  Filled 2015-07-27: qty 5

## 2015-07-27 MED ORDER — SODIUM CHLORIDE 0.9 % IJ SOLN
10.0000 mL | Freq: Once | INTRAMUSCULAR | Status: AC
  Administered 2015-07-27: 10 mL
  Filled 2015-07-27: qty 10

## 2015-07-27 MED ORDER — SODIUM CHLORIDE 0.9 % IV SOLN
300.0000 mg | Freq: Once | INTRAVENOUS | Status: AC
  Administered 2015-07-27: 300 mg via INTRAVENOUS
  Filled 2015-07-27: qty 15

## 2015-07-27 MED ORDER — ACETAMINOPHEN 500 MG PO TABS
1000.0000 mg | ORAL_TABLET | Freq: Once | ORAL | Status: AC
  Administered 2015-07-27: 1000 mg via ORAL
  Filled 2015-07-27: qty 2

## 2015-08-19 IMAGING — CR DG CHEST 1V PORT
1 series · 1 of 1 positions shown · non-contrast
Comparison: 11/01/2013

CLINICAL DATA: Port-A-Cath postop.

EXAM:
PORTABLE CHEST - 1 VIEW

[AP]
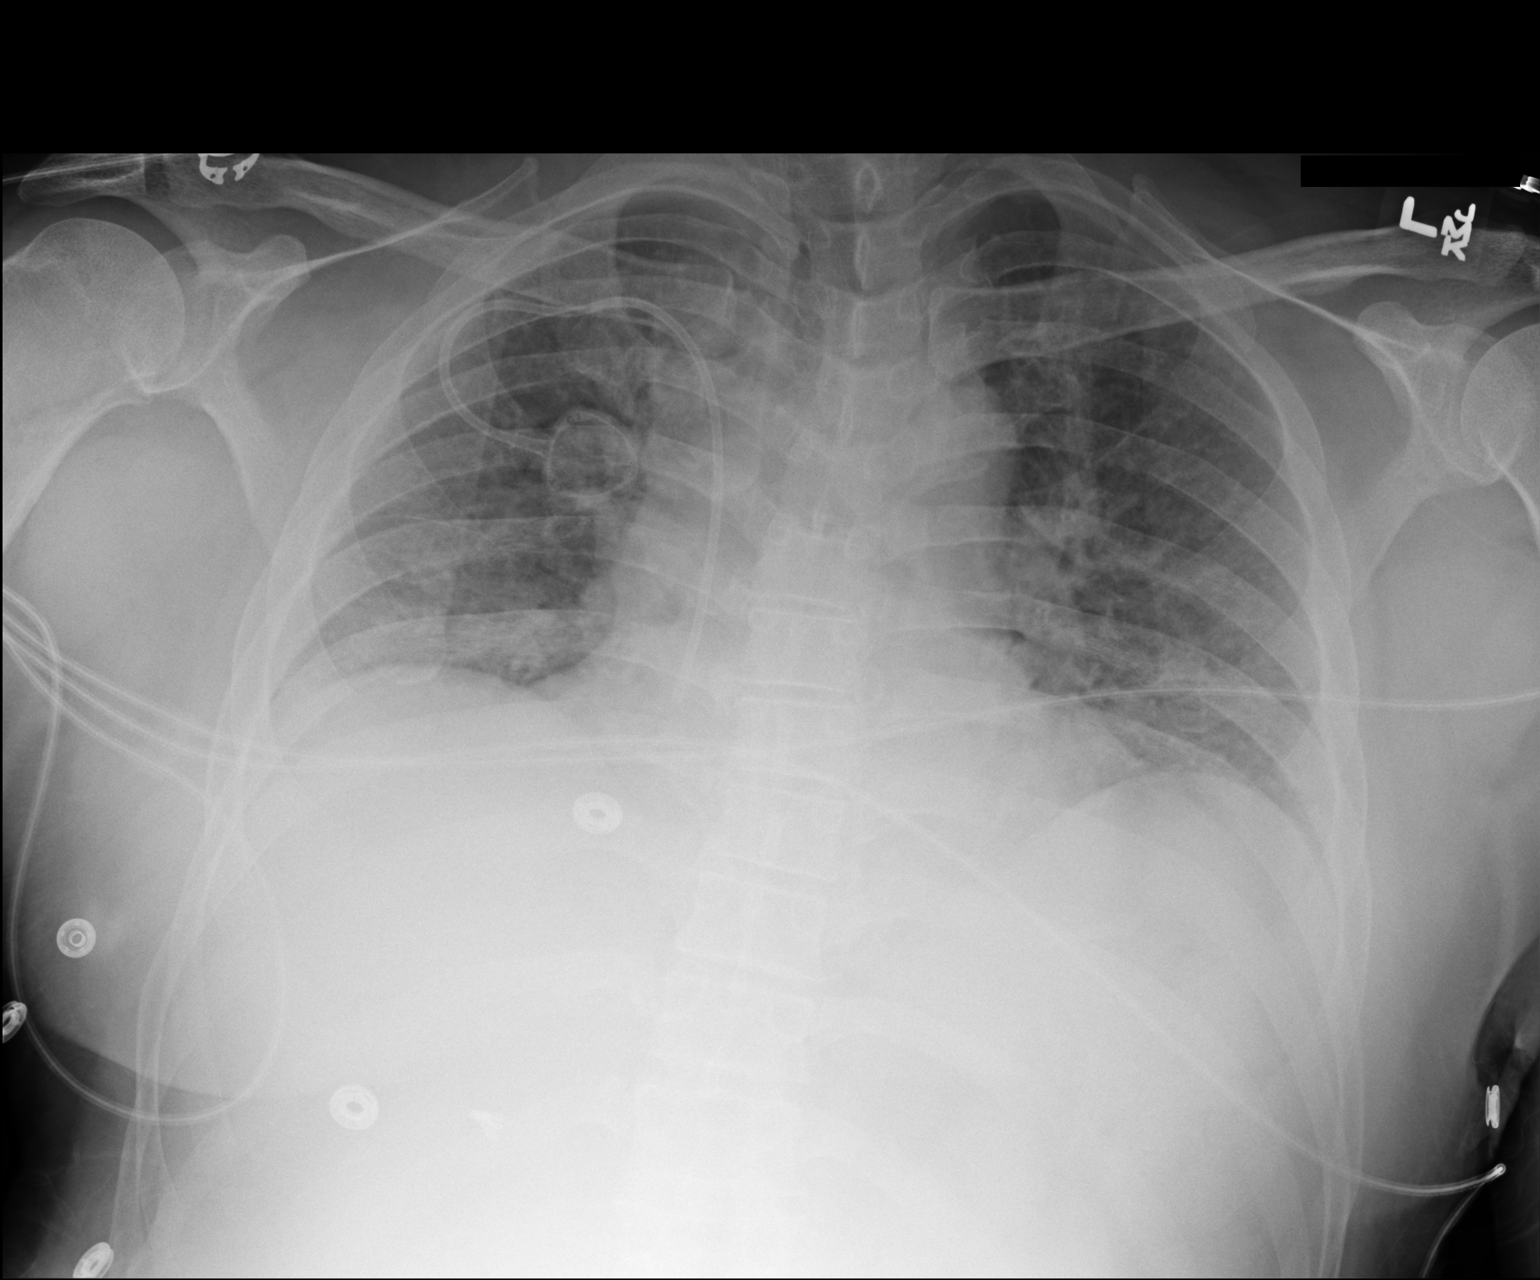

[1 of 1 positions shown; findings below may reference images not displayed]

FINDINGS: There is a right subclavian porta catheter, tip at the level of the
upper right atrium. There is hypoaeration which affects the catheter
tip positioning. There is mild deflection of the catheter at the
level of the clavicle crossing, without kinking.

No evidence of cardiomegaly or upper mediastinal contour change when
considering distortion by marked hypoaeration.

Increased density of the lungs which is likely related to
postoperative atelectasis. No cephalized blood flow to suggest
venous congestion. No pneumothorax or evidence of pleural effusion.
Cholecystectomy.
IMPRESSION: 1. Right subclavian porta catheter, as above. No evidence of
complication.
2. Hypoaeration with bilateral atelectasis.

## 2015-08-23 ENCOUNTER — Emergency Department (HOSPITAL_COMMUNITY)
Admission: EM | Admit: 2015-08-23 | Discharge: 2015-08-23 | Disposition: A | Discharge status: Home / Self Care | Payer: Medicare Other | Attending: Emergency Medicine | Admitting: Emergency Medicine

## 2015-08-23 ENCOUNTER — Emergency Department (HOSPITAL_COMMUNITY): Payer: Medicare Other

## 2015-08-23 ENCOUNTER — Encounter (HOSPITAL_COMMUNITY): Payer: Self-pay

## 2015-08-23 DIAGNOSIS — R51 Headache: Secondary | ICD-10-CM | POA: Diagnosis present

## 2015-08-23 DIAGNOSIS — I10 Essential (primary) hypertension: Secondary | ICD-10-CM | POA: Insufficient documentation

## 2015-08-23 DIAGNOSIS — Z720 Tobacco use: Secondary | ICD-10-CM | POA: Diagnosis not present

## 2015-08-23 DIAGNOSIS — Z7901 Long term (current) use of anticoagulants: Secondary | ICD-10-CM | POA: Diagnosis not present

## 2015-08-23 DIAGNOSIS — F329 Major depressive disorder, single episode, unspecified: Secondary | ICD-10-CM | POA: Insufficient documentation

## 2015-08-23 DIAGNOSIS — M1990 Unspecified osteoarthritis, unspecified site: Secondary | ICD-10-CM | POA: Diagnosis not present

## 2015-08-23 DIAGNOSIS — K219 Gastro-esophageal reflux disease without esophagitis: Secondary | ICD-10-CM | POA: Diagnosis not present

## 2015-08-23 DIAGNOSIS — Z79899 Other long term (current) drug therapy: Secondary | ICD-10-CM | POA: Diagnosis not present

## 2015-08-23 DIAGNOSIS — Z8701 Personal history of pneumonia (recurrent): Secondary | ICD-10-CM | POA: Insufficient documentation

## 2015-08-23 DIAGNOSIS — G35 Multiple sclerosis: Secondary | ICD-10-CM

## 2015-08-23 DIAGNOSIS — G894 Chronic pain syndrome: Secondary | ICD-10-CM | POA: Insufficient documentation

## 2015-08-23 DIAGNOSIS — Z86718 Personal history of other venous thrombosis and embolism: Secondary | ICD-10-CM | POA: Diagnosis not present

## 2015-08-23 DIAGNOSIS — Z8719 Personal history of other diseases of the digestive system: Secondary | ICD-10-CM | POA: Diagnosis not present

## 2015-08-23 DIAGNOSIS — F419 Anxiety disorder, unspecified: Secondary | ICD-10-CM | POA: Insufficient documentation

## 2015-08-23 DIAGNOSIS — G44021 Chronic cluster headache, intractable: Secondary | ICD-10-CM | POA: Diagnosis not present

## 2015-08-23 LAB — ACETAMINOPHEN LEVEL: Acetaminophen (Tylenol), Serum: 14 ug/mL (ref 10–30)

## 2015-08-23 LAB — CBC WITH DIFFERENTIAL/PLATELET
Basophils Absolute: 0.1 10*3/uL (ref 0.0–0.1)
Basophils Relative: 1 %
EOS ABS: 0.2 10*3/uL (ref 0.0–0.7)
Eosinophils Relative: 4 %
HEMATOCRIT: 39.4 % (ref 39.0–52.0)
HEMOGLOBIN: 13.1 g/dL (ref 13.0–17.0)
LYMPHS PCT: 61 %
Lymphs Abs: 4.2 10*3/uL — ABNORMAL HIGH (ref 0.7–4.0)
MCH: 27.3 pg (ref 26.0–34.0)
MCHC: 33.2 g/dL (ref 30.0–36.0)
MCV: 82.1 fL (ref 78.0–100.0)
Monocytes Absolute: 0.8 10*3/uL (ref 0.1–1.0)
Monocytes Relative: 13 %
NEUTROS ABS: 1.4 10*3/uL — AB (ref 1.7–7.7)
NEUTROS PCT: 21 %
Platelets: 241 10*3/uL (ref 150–400)
RBC: 4.8 MIL/uL (ref 4.22–5.81)
RDW: 15 % (ref 11.5–15.5)
WBC: 6.7 10*3/uL (ref 4.0–10.5)

## 2015-08-23 LAB — ETHANOL: Alcohol, Ethyl (B): <5 mg/dL (ref <5)

## 2015-08-23 LAB — COMPREHENSIVE METABOLIC PANEL
ALBUMIN: 3.9 g/dL (ref 3.5–5.0)
ALK PHOS: 83 U/L (ref 38–126)
ALT: 52 U/L (ref 17–63)
ANION GAP: 6 (ref 5–15)
AST: 41 U/L (ref 15–41)
BILIRUBIN TOTAL: 0.3 mg/dL (ref 0.3–1.2)
BUN: 11 mg/dL (ref 6–20)
CALCIUM: 8.7 mg/dL — AB (ref 8.9–10.3)
CO2: 24 mmol/L (ref 22–32)
Chloride: 108 mmol/L (ref 101–111)
Creatinine, Ser: 0.83 mg/dL (ref 0.61–1.24)
Glucose, Bld: 104 mg/dL — ABNORMAL HIGH (ref 65–99)
POTASSIUM: 3.3 mmol/L — AB (ref 3.5–5.1)
Sodium: 138 mmol/L (ref 135–145)
TOTAL PROTEIN: 7.7 g/dL (ref 6.5–8.1)

## 2015-08-23 LAB — SALICYLATE LEVEL: Salicylate Lvl: <4 mg/dL (ref 2.8–30.0)

## 2015-08-23 MED ORDER — DIPHENHYDRAMINE HCL 25 MG PO TABS: 25.0000 mg | ORAL_TABLET | Freq: Four times a day (QID) | ORAL | Status: DC

## 2015-08-23 MED ORDER — METOCLOPRAMIDE HCL 10 MG PO TABS: 10.0000 mg | ORAL_TABLET | Freq: Three times a day (TID) | ORAL | Status: DC | PRN

## 2015-08-23 MED ORDER — DIPHENHYDRAMINE HCL 50 MG/ML IJ SOLN
25.0000 mg | Freq: Once | INTRAMUSCULAR | Status: AC
  Administered 2015-08-23: 25 mg via INTRAMUSCULAR
  Filled 2015-08-23: qty 1

## 2015-08-23 MED ORDER — METOCLOPRAMIDE HCL 5 MG/ML IJ SOLN
10.0000 mg | Freq: Once | INTRAMUSCULAR | Status: AC
  Administered 2015-08-23: 10 mg via INTRAMUSCULAR
  Filled 2015-08-23: qty 2

## 2015-08-23 NOTE — ED Notes (Signed)
Writer made pt aware that urine is needed for sample, urinal at bedside. 

## 2015-08-23 NOTE — ED Notes (Signed)
Pt is attempting to use the bathroom at this time

## 2015-08-23 NOTE — Discharge Instructions (Signed)
Cluster Headache  Cluster headaches are recognized by their pattern of deep, intense head pain. They normally occur on one side of your head, but they may "switch sides" in subsequent episodes. Typically, cluster headaches:   · Are severe in nature.    · Occur repeatedly over weeks to months and are followed by periods of no headaches.    · Can last from 15 minutes to 3 hours.    · Occur at the same time each day, often at night.    · Occur several times a day.  CAUSES  The exact cause of cluster headaches is not known. Alcohol use may be associated with cluster headaches.  SIGNS AND SYMPTOMS   · Severe pain that begins in or around your eye or temple.    · One-sided head pain.    · Feeling sick to your stomach (nauseous).    · Sensitivity to light.    · Runny nose.    · Eye redness, tearing, and nasal stuffiness on the side of your head where you are experiencing pain.    · Sweaty, pale skin of the face.    · Droopy or swollen eyelid.    · Restlessness.  DIAGNOSIS   Cluster headaches are diagnosed based on symptoms and a physical exam. Your health care provider may order a CT scan or an MRI of your head or lab tests to see if your headaches are caused by other medical conditions.   TREATMENT   · Medicines for pain relief and to prevent recurrent attacks. Some people may need a combination of medicines.  · Oxygen for pain relief.    · Biofeedback programs to help reduce headache pain.    It may be helpful to keep a headache diary. This may help you find a trend for what is triggering your headaches. Your health care provider can develop a treatment plan.   HOME CARE INSTRUCTIONS   During cluster periods:   · Follow a regular sleep schedule. Do not vary the amount and time that you sleep from day to day. It is important to stay on the same schedule during a cluster period to help prevent headaches.    · Avoid alcohol.    · Stop smoking if you smoke.    SEEK MEDICAL CARE IF:  · You have any changes from your previous  cluster headaches either in intensity or frequency.    · You are not getting relief from medicines you are taking.    SEEK IMMEDIATE MEDICAL CARE IF:   · You faint.    · You have weakness or numbness, especially on one side of your body or face.    · You have double vision.    · You have nausea or vomiting that is not relieved within several hours.    · You cannot keep your balance or have difficulty talking or walking.    · You have neck pain or stiffness.    · You have a fever.  MAKE SURE YOU:  · Understand these instructions.    · Will watch your condition.    · Will get help right away if you are not doing well or get worse.     This information is not intended to replace advice given to you by your health care provider. Make sure you discuss any questions you have with your health care provider.     Document Released: 10/27/2005 Document Revised: 08/17/2013 Document Reviewed: 05/19/2013  [employer]vier Interactive Patient Education ©2016 [employer]

## 2015-08-23 NOTE — ED Notes (Signed)
Per [facility], pt. With hx of MS reports headaches for the past 2 weeks. Pt. stated he has cluster headaches 1-2x a year. Neuro checks done in field show no deficit.

## 2015-08-23 NOTE — ED Provider Notes (Signed)
CSN: [id number]     Arrival date & time 08/23/15  1547 History   First MD Initiated Contact with Patient 08/23/15 1602     No chief complaint on file.    (Consider location/radiation/quality/duration/timing/severity/associated sxs/prior Treatment) HPI Patient states he has a long history of cluster headaches. This has occurred sporadically for a number of years. He reports that headaches have been bothering him for about 2 weeks now. He reports they're severe in nature and stabbing. He reports sometimes is one side of the head and then other times it moves. He denies any changes in his vision. There is no fever or vomiting. He has tried home Percocet without pain relief. He reports he has discussed this with his neurologist but he is not been able to get good pain relief. He asks whether or not I can provide prescription for home oxygen to treat his headaches. Patient has MS and undergoes chronic treatment for that as well. He has baseline neurologic and gait dysfunction. He denies is been any change in relationship with his headaches. Past Medical History  Diagnosis Date  . Gallstones 02/17/2009  . Multiple sclerosis (HCC)   . Chronic pain syndrome 01/25/2008  . Insomnia 11/06/2008  . Anxiety   . Other specified visual disturbances   . Nonspecific elevation of levels of transaminase or lactic acid dehydrogenase (LDH)   . Trigeminal neuralgia   . Abdominal pain, unspecified site   . Benign paroxysmal positional vertigo   . Other syndromes affecting cervical region   . Memory loss   . Gait abnormality   . DVT (deep venous thrombosis) (HCC)     in the left arm  . Arthritis   . Pneumonia 2009  . Headache(784.0)     cluster headaches frequently-takes Topamax daily  . Joint pain   . Joint swelling   . Chronic back pain   . History of colonoscopy   . Depression     takes Zoloft daily  . GERD (gastroesophageal reflux disease)     takes Omeprazole as needed  . HTN (hypertension)    takes Lisinopril,Verapamil,and Triamterene HCTZ daily   Past Surgical History  Procedure Laterality Date  . Cholecystectomy  02/20/2009  . Portacath placement N/A 03/27/2014    Procedure: INSERTION PORT-A-CATH;  Surgeon: [name], MD;  Location: [facility] OR;  Service: General;  Laterality: N/A;  . Port a cath revision N/A 07/06/2015    Procedure: Removal and replacement of PORT A CATH;  Surgeon: [name], MD;  Location: [facility] OR;  Service: General;  Laterality: N/A;   Family History  Problem Relation Age of Onset  . Cancer Father   . Diabetes Mother    Social History  Substance Use Topics  . Smoking status: Current Every Day Smoker -- 1.00 packs/day for 38 years    Types: Cigarettes  . Smokeless tobacco: Never Used  . Alcohol Use: No    Review of Systems 10 Systems reviewed and are negative for acute change except as noted in the HPI.    Allergies  Review of patient's allergies indicates no known allergies.  Home Medications   Prior to Admission medications   Medication Sig Start Date End Date Taking? Authorizing Provider  baclofen (LIORESAL) 20 MG tablet Take 20 mg by mouth 4 (four) times daily.    Yes Historical Provider, MD  dalfampridine (AMPYRA) 10 MG TB12 Take 10 mg by mouth 2 (two) times daily.   Yes Historical Provider, MD  Eszopiclone (ESZOPICLONE) 3 MG TABS  Take 3 mg by mouth at bedtime as needed (sleep).    Yes Historical Provider, MD  FLUARIX QUADRIVALENT 0.5 ML injection Inject 1 Dose into the muscle once. 07/31/15  Yes Historical Provider, MD  gabapentin (NEURONTIN) 600 MG tablet Take 600 mg by mouth 4 (four) times daily.    Yes Historical Provider, MD  lisinopril-hydrochlorothiazide (PRINZIDE,ZESTORETIC) 10-12.5 MG per tablet Take 1 tablet by mouth daily.   Yes Historical Provider, MD  oxyCODONE-acetaminophen (PERCOCET) 10-325 MG tablet Take 1 tablet by mouth every 4 (four) hours as needed for pain.   Yes Historical Provider, MD  sertraline (ZOLOFT) 50 MG  tablet Take 50 mg by mouth daily.    Yes Historical Provider, MD  topiramate (TOPAMAX) 50 MG tablet Take 50 mg by mouth 3 (three) times daily.   Yes Historical Provider, MD  triamterene-hydrochlorothiazide (MAXZIDE-25) 37.5-25 MG per tablet Take 1 tablet by mouth 2 (two) times daily.  02/25/15  Yes Historical Provider, MD  verapamil (CALAN) 80 MG tablet Take 80 mg by mouth 2 (two) times daily.   Yes Historical Provider, MD  diphenhydrAMINE (BENADRYL) 25 MG tablet Take 1 tablet (25 mg total) by mouth every 6 (six) hours. Take with Reglan for migraine headache 08/23/15   Arby Barrette, MD  enoxaparin (LOVENOX) 80 MG/0.8ML injection Inject 80 mg into the skin daily.    Historical Provider, MD  HYDROcodone-acetaminophen (NORCO) 5-325 MG per tablet Take 1-2 tablets by mouth every 6 (six) hours as needed for moderate pain or severe pain. 07/06/15   [name], MD  metoCLOPramide (REGLAN) 10 MG tablet Take 1 tablet (10 mg total) by mouth every 8 (eight) hours as needed for nausea (Migraine headache, take with Benadryl.). 08/23/15   Arby Barrette, MD  warfarin (COUMADIN) 5 MG tablet Take 5 mg by mouth every evening.     Historical Provider, MD   BP 114/64 mmHg  Pulse 88  Temp(Src) 98.5 F (36.9 C) (Oral)  Resp 18  SpO2 100% Physical Exam  Constitutional: He is oriented to person, place, and time. He appears well-developed and well-nourished.  Patient is variably interactive and in sometimes more somnolent. He is nontoxic. No respiratory distress.  HENT:  Head: Normocephalic and atraumatic.  Right Ear: External ear normal.  Left Ear: External ear normal.  Nose: Nose normal.  Mouth/Throat: Oropharynx is clear and moist.  Eyes: EOM are normal. Pupils are equal, round, and reactive to light.  Neck: Neck supple.  Cardiovascular: Normal rate, regular rhythm, normal heart sounds and intact distal pulses.   Pulmonary/Chest: Effort normal and breath sounds normal.  Abdominal: Soft. Bowel sounds are  normal. He exhibits no distension. There is no tenderness.  Musculoskeletal: Normal range of motion. He exhibits no edema or tenderness.  Neurological: He is alert and oriented to person, place, and time. He has normal strength. No cranial nerve deficit. Coordination abnormal. GCS eye subscore is 4. GCS verbal subscore is 5. GCS motor subscore is 6.  Skin: Skin is warm, dry and intact.  Psychiatric: He has a normal mood and affect.    ED Course  Procedures (including critical care time) Labs Review Labs Reviewed  COMPREHENSIVE METABOLIC PANEL - Abnormal; Notable for the following:    Potassium 3.3 (*)    Glucose, Bld 104 (*)    Calcium 8.7 (*)    All other components within normal limits  CBC WITH DIFFERENTIAL/PLATELET - Abnormal; Notable for the following:    Neutro Abs 1.4 (*)    Lymphs Abs 4.2 (*)  All other components within normal limits  ETHANOL  ACETAMINOPHEN LEVEL  SALICYLATE LEVEL  URINE RAPID DRUG SCREEN, HOSP PERFORMED  URINALYSIS, ROUTINE W REFLEX MICROSCOPIC (NOT AT [facility])    Imaging Review Ct Head Wo Contrast  08/23/2015  CLINICAL DATA:  Headaches for 2 weeks EXAM: CT HEAD WITHOUT CONTRAST TECHNIQUE: Contiguous axial images were obtained from the base of the skull through the vertex without intravenous contrast. COMPARISON:  October 04, 2014 MRI of brain, head CT January 24, 2011 FINDINGS: There is no midline shift, hydrocephalus, or mass. No acute hemorrhage or acute transcortical infarct is identified. The bony calvarium is intact. The visualized sinuses are clear. IMPRESSION: No focal acute intracranial abnormality identified. Electronically Signed   By: Sherian Rein M.D.   On: 08/23/2015 18:01   I have personally reviewed and evaluated these images and lab results as part of my medical decision-making.   EKG Interpretation None     Recheck 22:10 patient reports much improved after Reglan and Benadryl and oxygen. He is appropriate and interactive with family  members. MDM   Final diagnoses:  Intractable chronic cluster headache  Multiple sclerosis (HCC)   Patient presents with history of headaches. He also has complication of multiple sclerosis. Patient experienced significant pain improvement with administration of supplemental oxygen and Benadryl and Reglan. CT head was obtained for this patient reported difficulty controlling any pain with home medications. Has baseline neurologic weakness due to MS. At this time no sign of infection or acute vascular emergency. Patient given Rx for Benadryl/Reglan but counseled needs to address pain control and medications with his neurologist due to confounding factors of MS and complex medication regimen.    Arby Barrette, MD 08/29/15 [phone]

## 2015-08-23 NOTE — ED Notes (Signed)
Patient attempting to collect urine specimen at this time. 

## 2015-08-23 NOTE — ED Notes (Signed)
Bed: ZO10 Expected date:  Expected time:  Means of arrival:  Comments: 54-ems, headachex2 weeks

## 2015-08-23 NOTE — ED Notes (Signed)
[facility] called for patient transport home.  Family at bedside states that patient is unable to walk with assistance and they will not be providing a ride or assistance home today.

## 2015-08-24 ENCOUNTER — Inpatient Hospital Stay: Payer: Medicare Other

## 2015-08-31 ENCOUNTER — Inpatient Hospital Stay: Payer: Medicare Other | Attending: Oncology

## 2015-08-31 VITALS — BP 128/72 | HR 84 | Temp 97.2°F

## 2015-08-31 DIAGNOSIS — Z79899 Other long term (current) drug therapy: Secondary | ICD-10-CM | POA: Insufficient documentation

## 2015-08-31 DIAGNOSIS — G35 Multiple sclerosis: Secondary | ICD-10-CM | POA: Diagnosis present

## 2015-08-31 MED ORDER — SODIUM CHLORIDE 0.9 % IV SOLN
300.0000 mg | Freq: Once | INTRAVENOUS | Status: AC
  Administered 2015-08-31: 300 mg via INTRAVENOUS
  Filled 2015-08-31: qty 15

## 2015-08-31 MED ORDER — HEPARIN SOD (PORK) LOCK FLUSH 100 UNIT/ML IV SOLN
500.0000 [IU] | Freq: Once | INTRAVENOUS | Status: AC
Start: 2015-08-31 — End: 2015-08-31
  Administered 2015-08-31: 500 [IU] via INTRAVENOUS
  Filled 2015-08-31: qty 5

## 2015-08-31 MED ORDER — ACETAMINOPHEN 500 MG PO TABS
1000.0000 mg | ORAL_TABLET | Freq: Once | ORAL | Status: AC
  Administered 2015-08-31: 1000 mg via ORAL
  Filled 2015-08-31: qty 2

## 2015-08-31 MED ORDER — SODIUM CHLORIDE 0.9 % IV SOLN
Freq: Once | INTRAVENOUS | Status: AC
  Administered 2015-08-31: 12:00:00 via INTRAVENOUS
  Filled 2015-08-31: qty 1000

## 2015-09-06 NOTE — Telephone Encounter (Signed)
Error

## 2015-09-28 ENCOUNTER — Inpatient Hospital Stay: Payer: Medicare Other

## 2015-10-01 ENCOUNTER — Inpatient Hospital Stay: Payer: Medicare Other

## 2015-10-01 ENCOUNTER — Ambulatory Visit: Payer: Self-pay | Admitting: Family Medicine

## 2015-10-05 ENCOUNTER — Inpatient Hospital Stay: Payer: Medicare Other

## 2015-10-06 IMAGING — CT CT ABD-PELV W/ CM
2 of 5 series · 16 of 46 positions shown, 18 images · IV contrast (omnipaque)
Comparison: 08/09/2013

CLINICAL DATA: Mid abdominal pain.  Loss of appetite.

EXAM:
CT ABDOMEN AND PELVIS WITH CONTRAST
TECHNIQUE: Multidetector CT imaging of the abdomen and pelvis was performed
using the standard protocol following bolus administration of
intravenous contrast.
CONTRAST:  100mL OMNIPAQUE IOHEXOL 300 MG/ML  SOLN

[Series 2: abd/ pelvis 5.0 i30f 1 · axial · 0.75mm/px · z∈[+919,+1359]mm · 13 of 100 slices shown, 15 images]
[im 6/100  soft-tissue]
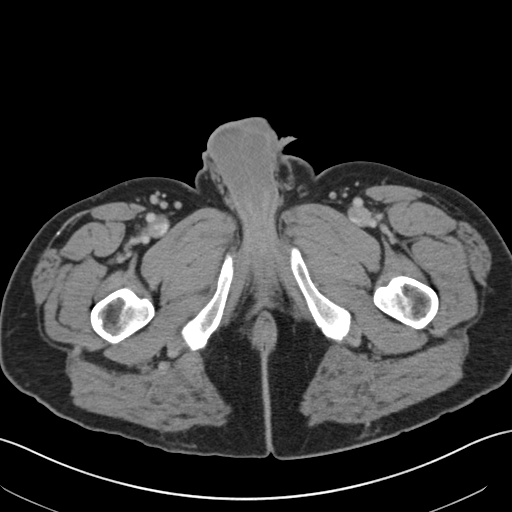
[im 6/100  bone]
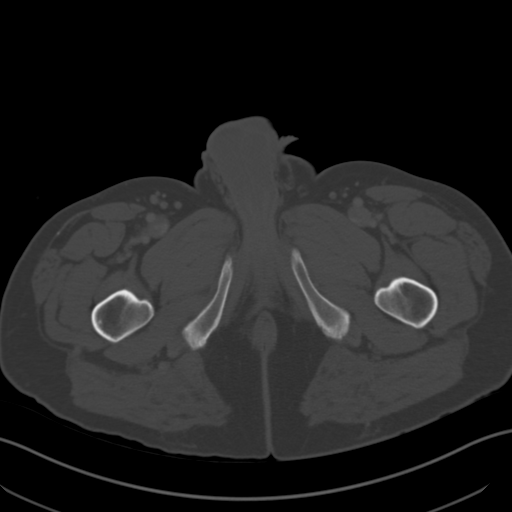
[im 16/100  soft-tissue]
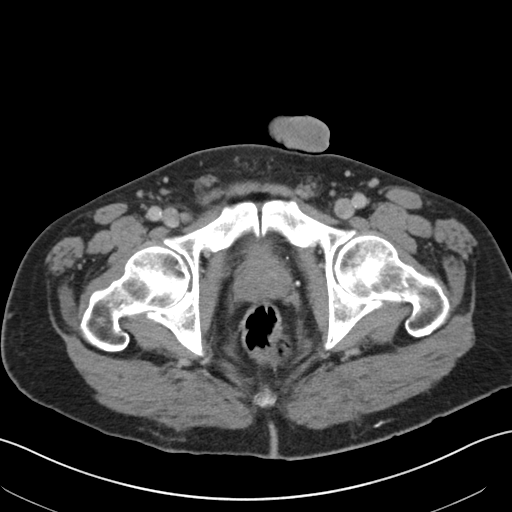
[im 21/100  soft-tissue]
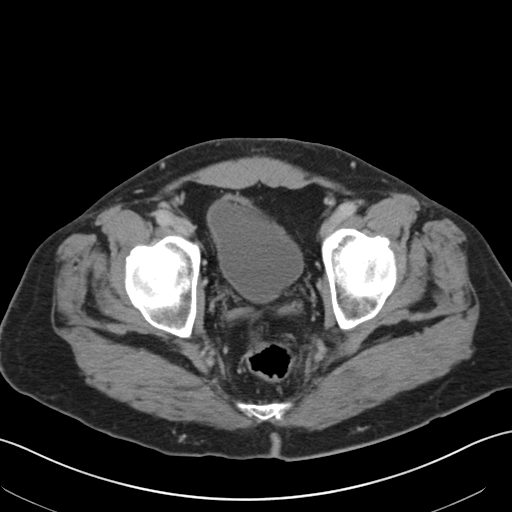
[im 27/100  soft-tissue]
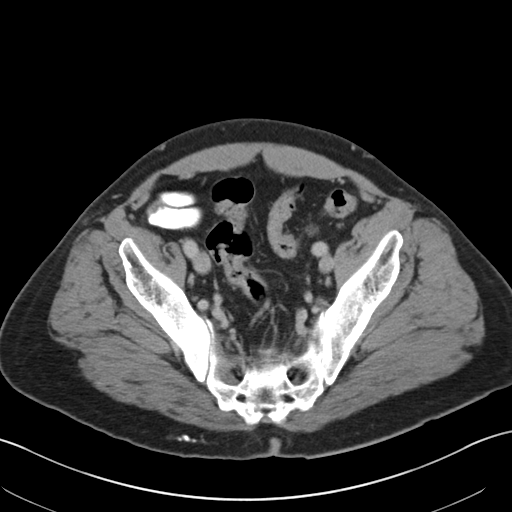
[im 37/100  soft-tissue]
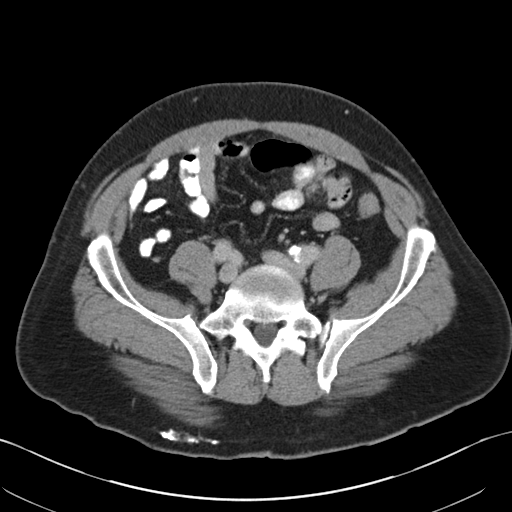
[im 42/100  soft-tissue]
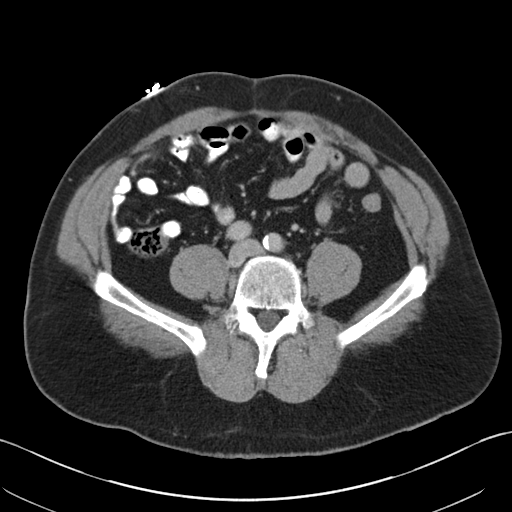
[im 53/100  soft-tissue]
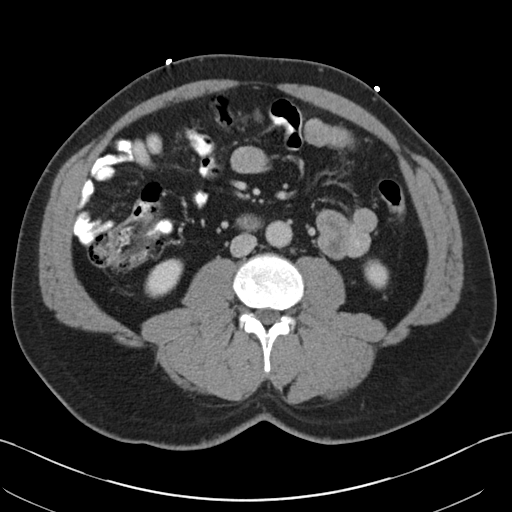
[im 58/100  soft-tissue]
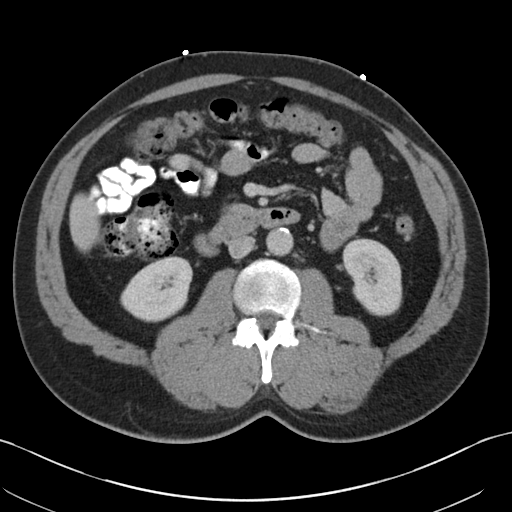
[im 63/100  soft-tissue]
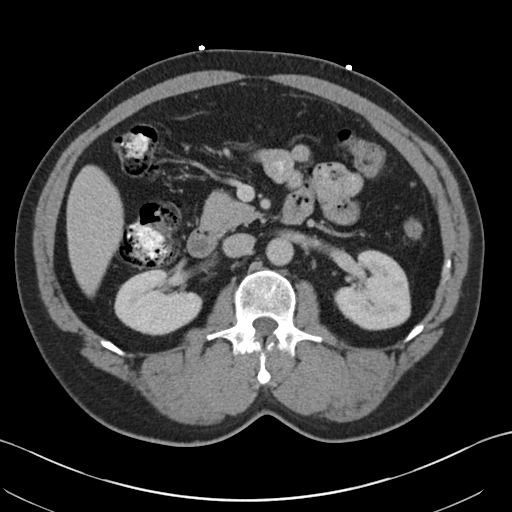
[im 63/100  bone]
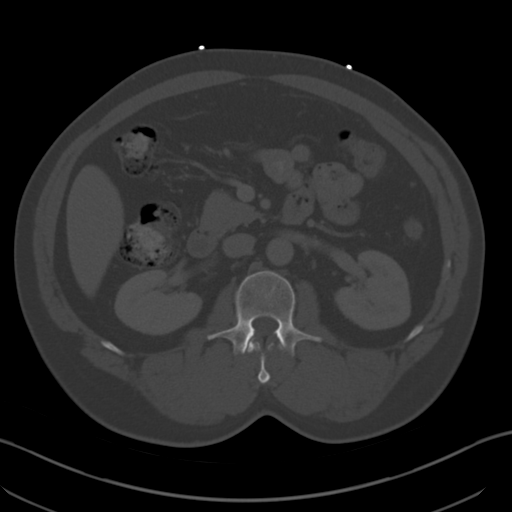
[im 73/100  soft-tissue]
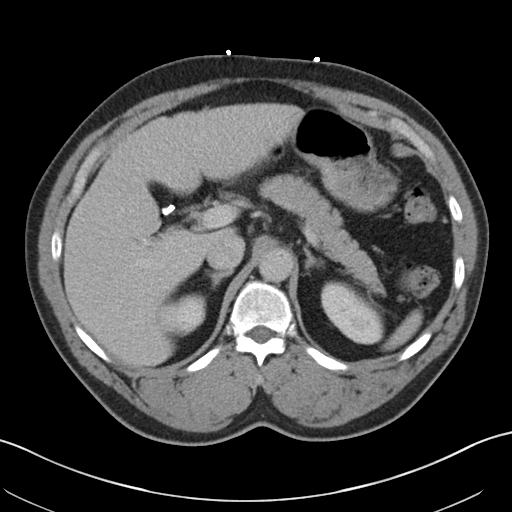
[im 79/100  soft-tissue]
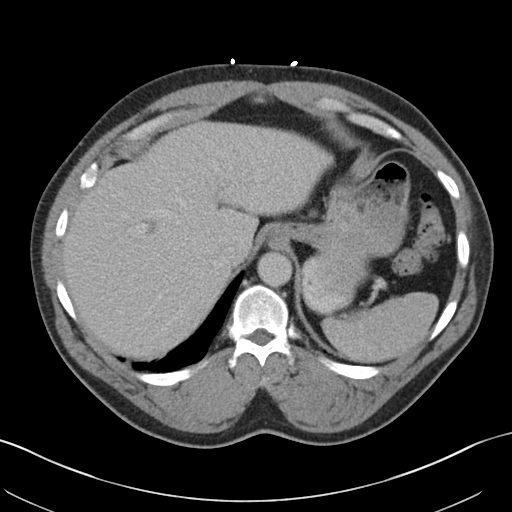
[im 84/100  soft-tissue]
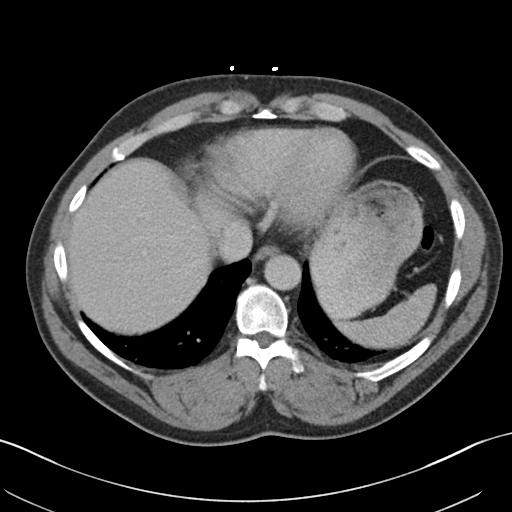
[im 94/100  soft-tissue]
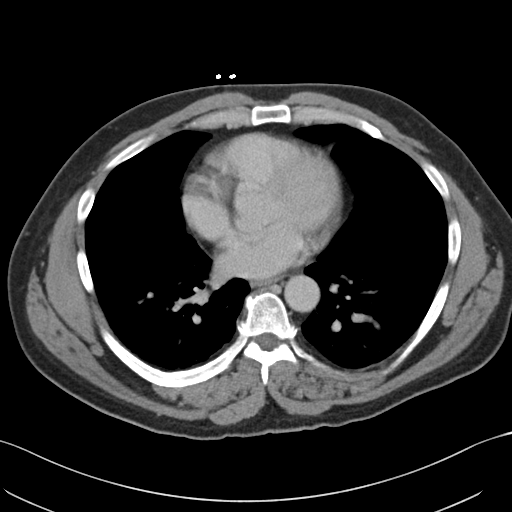

[Series 5: coronals · coronal · 0.71mm/px · 3 of 135 slices shown]
[im 45/135  soft-tissue]
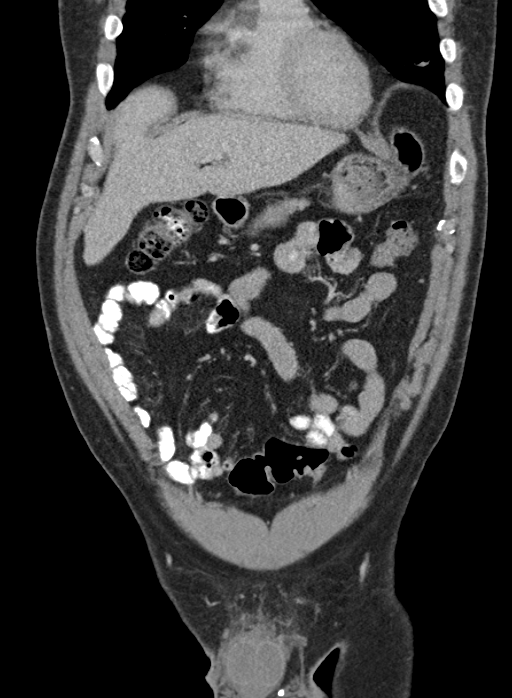
[im 60/135  soft-tissue]
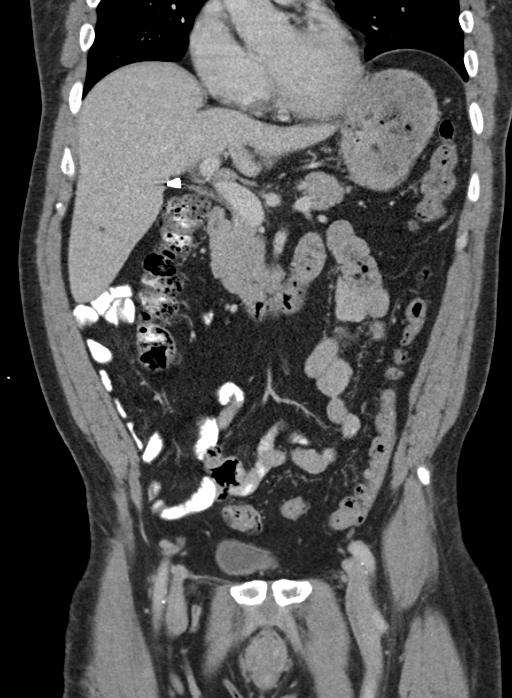
[im 75/135  soft-tissue]
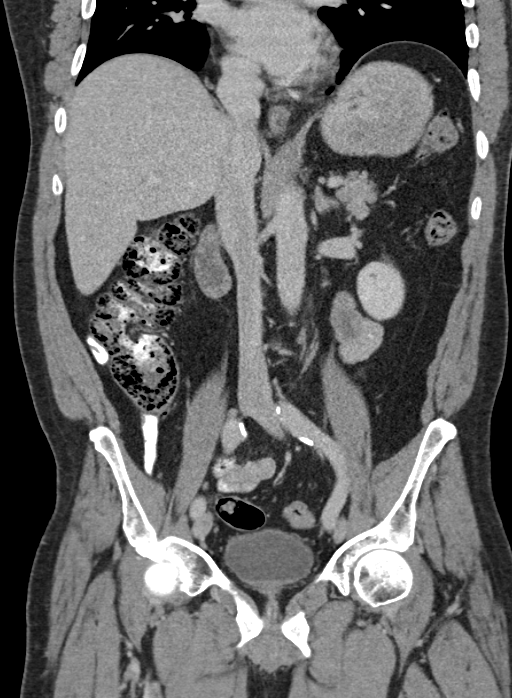

[16 of 46 positions shown; findings below may reference images not displayed]

FINDINGS: Atelectasis or infiltration in both lung bases.

Surgical absence of the gallbladder. Scattered tiny sub cm
low-attenuation lesions in the liver probably represent small cysts.
The pancreas, spleen, adrenal glands, abdominal aorta, inferior vena
cava, and retroperitoneal lymph nodes are unremarkable.
Subcentimeter parenchymal cysts in both kidneys. No hydronephrosis
or solid mass. Stomach, small bowel, and colon are not abnormally
distended. Contrast material flows through to the cecum without
evidence of obstruction. No free air or free fluid in the abdomen.
Abdominal wall musculature appears intact.

Pelvis: Prostate gland is not enlarged. Bladder wall is not
thickened. Appendix is normal. Scattered diverticula in the sigmoid
colon without evidence of diverticulitis. No free or loculated
pelvic fluid collections. No significant pelvic lymphadenopathy.
Calcifications in the subcutaneous fat over the gluteal regions
likely represent injection granulomas. Probable prior right inguinal
hernia repair. No destructive bone lesions. No significant changes
since prior study.
IMPRESSION: No acute process demonstrated in the abdomen or pelvis. Chronic
incidental findings are unchanged.

## 2015-10-08 ENCOUNTER — Inpatient Hospital Stay: Payer: Medicare Other

## 2015-10-08 ENCOUNTER — Encounter: Payer: Self-pay | Admitting: Family Medicine

## 2015-10-08 ENCOUNTER — Inpatient Hospital Stay: Payer: Medicare Other | Attending: Family Medicine | Admitting: Family Medicine

## 2015-10-08 VITALS — BP 109/61 | HR 58

## 2015-10-08 VITALS — BP 106/62 | HR 60 | Temp 97.9°F | Wt 188.9 lb

## 2015-10-08 DIAGNOSIS — Z79899 Other long term (current) drug therapy: Secondary | ICD-10-CM | POA: Diagnosis not present

## 2015-10-08 DIAGNOSIS — I10 Essential (primary) hypertension: Secondary | ICD-10-CM | POA: Diagnosis not present

## 2015-10-08 DIAGNOSIS — G894 Chronic pain syndrome: Secondary | ICD-10-CM | POA: Insufficient documentation

## 2015-10-08 DIAGNOSIS — M549 Dorsalgia, unspecified: Secondary | ICD-10-CM | POA: Diagnosis not present

## 2015-10-08 DIAGNOSIS — Z86718 Personal history of other venous thrombosis and embolism: Secondary | ICD-10-CM | POA: Insufficient documentation

## 2015-10-08 DIAGNOSIS — F329 Major depressive disorder, single episode, unspecified: Secondary | ICD-10-CM | POA: Insufficient documentation

## 2015-10-08 DIAGNOSIS — Z7901 Long term (current) use of anticoagulants: Secondary | ICD-10-CM | POA: Insufficient documentation

## 2015-10-08 DIAGNOSIS — G35 Multiple sclerosis: Secondary | ICD-10-CM

## 2015-10-08 DIAGNOSIS — K219 Gastro-esophageal reflux disease without esophagitis: Secondary | ICD-10-CM | POA: Insufficient documentation

## 2015-10-08 DIAGNOSIS — F17210 Nicotine dependence, cigarettes, uncomplicated: Secondary | ICD-10-CM | POA: Insufficient documentation

## 2015-10-08 DIAGNOSIS — F419 Anxiety disorder, unspecified: Secondary | ICD-10-CM | POA: Diagnosis not present

## 2015-10-08 MED ORDER — SODIUM CHLORIDE 0.9 % IV SOLN
Freq: Once | INTRAVENOUS | Status: AC
  Administered 2015-10-08: 12:00:00 via INTRAVENOUS
  Filled 2015-10-08: qty 1000

## 2015-10-08 MED ORDER — SODIUM CHLORIDE 0.9 % IV SOLN
300.0000 mg | Freq: Once | INTRAVENOUS | Status: AC
  Administered 2015-10-08: 300 mg via INTRAVENOUS
  Filled 2015-10-08: qty 15

## 2015-10-08 MED ORDER — HEPARIN SOD (PORK) LOCK FLUSH 100 UNIT/ML IV SOLN
500.0000 [IU] | Freq: Once | INTRAVENOUS | Status: AC
  Administered 2015-10-08: 500 [IU] via INTRAVENOUS

## 2015-10-08 MED ORDER — ACETAMINOPHEN 500 MG PO TABS
1000.0000 mg | ORAL_TABLET | Freq: Once | ORAL | Status: AC
  Administered 2015-10-08: 1000 mg via ORAL
  Filled 2015-10-08: qty 2

## 2015-10-08 NOTE — Progress Notes (Signed)
Pt brought to exam room 3 via wheelchair.  Patient denies pain or discomfort.

## 2015-10-08 NOTE — Progress Notes (Signed)
[facility]  Telephone:([phone]) [phone] [phone]) [phone]  ID: [name] OB: 12/05/1960  MR#: [id number]  OXB#:[id number]  Patient Care Team: Fleet Contras, MD as PCP - General (Internal Medicine)  CHIEF COMPLAINT:  Chief Complaint  Patient presents with  . Follow-up   Regarding Tysabri infusions for MS  INTERVAL HISTORY: Patient returns to clinic today for routine 6 month evaluation. He continues to tolerate Tysabri infusions without side effects.  He was last seen by Neurologist, Dr. Tinnie Gens 1 week ago. At that time they discussed a new medication that he is expected to start in January 2017.  Currently, he feels well and is at his baseline.  He has no new neurologic complaints, no recent flairs or hospital visit.  He denies any recent fevers.  He has a good appetite and denies weight loss.  He has no chest pain or shortness of breath.  He denies any nausea, vomiting, constipation, or diarrhea.  He has no urinary complaints.  Patient offers no specific complaints today.  REVIEW OF SYSTEMS:   Review of Systems  Constitutional: Negative for fever, chills, weight loss and malaise/fatigue.  HENT: Negative for congestion and sore throat.   Eyes: Negative.  Negative for blurred vision, double vision and photophobia.  Respiratory: Negative for cough, hemoptysis, sputum production, shortness of breath, wheezing and stridor.   Cardiovascular: Negative for chest pain, palpitations, orthopnea, claudication, leg swelling and PND.  Gastrointestinal: Negative for heartburn, nausea, vomiting, abdominal pain, diarrhea, constipation, blood in stool and melena.  Genitourinary: Negative.   Musculoskeletal: Negative.   Skin: Negative.   Neurological: Positive for focal weakness and weakness. Negative for dizziness, tingling and seizures.  Endo/Heme/Allergies: Does not bruise/bleed easily.  Psychiatric/Behavioral: Negative for depression. The patient is not nervous/anxious.      As per HPI. Otherwise, a complete review of systems is negatve.  PAST MEDICAL HISTORY: Past Medical History  Diagnosis Date  . Gallstones 02/17/2009  . Multiple sclerosis (HCC)   . Chronic pain syndrome 01/25/2008  . Insomnia 11/06/2008  . Anxiety   . Other specified visual disturbances   . Nonspecific elevation of levels of transaminase or lactic acid dehydrogenase (LDH)   . Trigeminal neuralgia   . Abdominal pain, unspecified site   . Benign paroxysmal positional vertigo   . Other syndromes affecting cervical region   . Memory loss   . Gait abnormality   . DVT (deep venous thrombosis) (HCC)     in the left arm  . Arthritis   . Pneumonia 2009  . Headache(784.0)     cluster headaches frequently-takes Topamax daily  . Joint pain   . Joint swelling   . Chronic back pain   . History of colonoscopy   . Depression     takes Zoloft daily  . GERD (gastroesophageal reflux disease)     takes Omeprazole as needed  . HTN (hypertension)     takes Lisinopril,Verapamil,and Triamterene HCTZ daily    PAST SURGICAL HISTORY: Past Surgical History  Procedure Laterality Date  . Cholecystectomy  02/20/2009  . Portacath placement N/A 03/27/2014    Procedure: INSERTION PORT-A-CATH;  Surgeon: [name], MD;  Location: [facility] OR;  Service: General;  Laterality: N/A;  . Port a cath revision N/A 07/06/2015    Procedure: Removal and replacement of PORT A CATH;  Surgeon: [name], MD;  Location: [facility] OR;  Service: General;  Laterality: N/A;    FAMILY HISTORY Family History  Problem Relation Age of Onset  . Cancer Father   .  Diabetes Mother        ADVANCED DIRECTIVES:    HEALTH MAINTENANCE: Social History  Substance Use Topics  . Smoking status: Current Every Day Smoker -- 1.00 packs/day for 38 years    Types: Cigarettes  . Smokeless tobacco: Never Used  . Alcohol Use: No     Colonoscopy:  PAP:  Bone density:  Lipid panel:  No Known Allergies  Current Outpatient  Prescriptions  Medication Sig Dispense Refill  . baclofen (LIORESAL) 20 MG tablet Take 20 mg by mouth 4 (four) times daily.     [name] dalfampridine (AMPYRA) 10 MG TB12 Take 10 mg by mouth 2 (two) times daily.    . diphenhydrAMINE (BENADRYL) 25 MG tablet Take 1 tablet (25 mg total) by mouth every 6 (six) hours. Take with Reglan for migraine headache 20 tablet 0  . enoxaparin (LOVENOX) 80 MG/0.8ML injection Inject 80 mg into the skin daily.    . Eszopiclone (ESZOPICLONE) 3 MG TABS Take 3 mg by mouth at bedtime as needed (sleep).     [name] FLUARIX QUADRIVALENT 0.5 ML injection Inject 1 Dose into the muscle once.  0  . gabapentin (NEURONTIN) 600 MG tablet Take 600 mg by mouth 4 (four) times daily.     [name] HYDROcodone-acetaminophen (NORCO) 5-325 MG per tablet Take 1-2 tablets by mouth every 6 (six) hours as needed for moderate pain or severe pain. 30 tablet 0  . lisinopril-hydrochlorothiazide (PRINZIDE,ZESTORETIC) 10-12.5 MG per tablet Take 1 tablet by mouth daily.    . metoCLOPramide (REGLAN) 10 MG tablet Take 1 tablet (10 mg total) by mouth every 8 (eight) hours as needed for nausea (Migraine headache, take with Benadryl.). 30 tablet 0  . oxyCODONE-acetaminophen (PERCOCET) 10-325 MG tablet Take 1 tablet by mouth every 4 (four) hours as needed for pain.    [name] sertraline (ZOLOFT) 50 MG tablet Take 50 mg by mouth daily.     [name] topiramate (TOPAMAX) 50 MG tablet Take 50 mg by mouth 3 (three) times daily.    [name] triamterene-hydrochlorothiazide (MAXZIDE-25) 37.5-25 MG per tablet Take 1 tablet by mouth 2 (two) times daily.     . verapamil (CALAN) 80 MG tablet Take 80 mg by mouth 2 (two) times daily.    [name] warfarin (COUMADIN) 5 MG tablet Take 5 mg by mouth every evening.      No current facility-administered medications for this visit.    OBJECTIVE: Filed Vitals:   10/08/15 1053  BP: 106/62  Pulse: 60  Temp: 97.9 F (36.6 C)     Body mass index is 27.89 kg/(m^2).    ECOG FS:2 - Symptomatic, <50% confined to  bed  General: Well-developed, well-nourished, no acute distress. Patient in motorized wheelchair related to weakness. Eyes: anicteric sclera. Lungs: Clear to auscultation bilaterally. Heart: Regular rate and rhythm. No rubs, murmurs, or gallops. Abdomen: Soft, nontender, nondistended. No organomegaly noted, normoactive bowel sounds. Musculoskeletal: No edema, cyanosis, or clubbing. Neuro: Alert, answering all questions appropriately. Cranial nerves grossly intact. Skin: No rashes or petechiae noted. Psych: Normal affect.   LAB RESULTS:  Lab Results  Component Value Date   NA 138 08/23/2015   K 3.3* 08/23/2015   CL 108 08/23/2015   CO2 24 08/23/2015   GLUCOSE 104* 08/23/2015   BUN 11 08/23/2015   CREATININE 0.83 08/23/2015   CALCIUM 8.7* 08/23/2015   PROT 7.7 08/23/2015   ALBUMIN 3.9 08/23/2015   AST 41 08/23/2015   ALT 52 08/23/2015   ALKPHOS 83 08/23/2015   BILITOT  0.3 08/23/2015   GFRNONAA >60 08/23/2015   GFRAA >60 08/23/2015    Lab Results  Component Value Date   WBC 6.7 08/23/2015   NEUTROABS 1.4* 08/23/2015   HGB 13.1 08/23/2015   HCT 39.4 08/23/2015   MCV 82.1 08/23/2015   PLT 241 08/23/2015     STUDIES: No results found.  ASSESSMENT: Tysabri infusion for multiple sclerosis.  PLAN:    1.  Tysabri infusion for multiple sclerosis: Patient will receive 300 mg Tysabri today.  He will return to clinic every 4 weeks for continuation of infusions.  There is discussion regarding a change in his medication by Dr. Tinnie Gens in January 2017.  Will continue to schedule for Tysabri until new prescription and insurance authorization has been obtained.   He will return to clinic in 6 months for routine evaluation.  Patient understands that all of his laboratory work, including JC virus, will be monitored by his primary neurologist.  He also understands that if he has any questions regarding Tysabri or his multiple sclerosis but they should be directed towards Dr. Tinnie Gens.     Patient expressed understanding and was in agreement with this plan. He also understands that He can call clinic at any time with any questions, concerns, or complaints.    Loann Quill, NP   10/08/2015 11:11 AM

## 2015-10-26 ENCOUNTER — Inpatient Hospital Stay: Payer: Medicare Other

## 2015-11-02 ENCOUNTER — Inpatient Hospital Stay: Payer: Medicare Other

## 2015-11-06 ENCOUNTER — Inpatient Hospital Stay: Payer: Medicare HMO

## 2015-11-07 ENCOUNTER — Inpatient Hospital Stay: Payer: Medicare Other | Attending: Family Medicine

## 2015-11-07 VITALS — BP 113/59 | HR 60

## 2015-11-07 DIAGNOSIS — G35 Multiple sclerosis: Secondary | ICD-10-CM | POA: Diagnosis not present

## 2015-11-07 DIAGNOSIS — Z79899 Other long term (current) drug therapy: Secondary | ICD-10-CM | POA: Insufficient documentation

## 2015-11-07 MED ORDER — SODIUM CHLORIDE 0.9 % IV SOLN
Freq: Once | INTRAVENOUS | Status: AC
  Administered 2015-11-07: 10:00:00 via INTRAVENOUS
  Filled 2015-11-07: qty 1000

## 2015-11-07 MED ORDER — HEPARIN SOD (PORK) LOCK FLUSH 100 UNIT/ML IV SOLN
500.0000 [IU] | Freq: Once | INTRAVENOUS | Status: AC
  Administered 2015-11-07: 500 [IU]
  Filled 2015-11-07: qty 5

## 2015-11-07 MED ORDER — SODIUM CHLORIDE 0.9 % IV SOLN
300.0000 mg | Freq: Once | INTRAVENOUS | Status: AC
  Administered 2015-11-07: 300 mg via INTRAVENOUS
  Filled 2015-11-07: qty 15

## 2015-11-07 MED ORDER — SODIUM CHLORIDE 0.9 % IJ SOLN
10.0000 mL | INTRAMUSCULAR | Status: AC | PRN
  Administered 2015-11-07: 10 mL
  Filled 2015-11-07: qty 10

## 2015-11-07 MED ORDER — ACETAMINOPHEN 500 MG PO TABS
1000.0000 mg | ORAL_TABLET | Freq: Once | ORAL | Status: AC
  Administered 2015-11-07: 1000 mg via ORAL
  Filled 2015-11-07: qty 2

## 2015-11-22 ENCOUNTER — Encounter: Payer: Self-pay | Admitting: Internal Medicine

## 2015-11-23 ENCOUNTER — Inpatient Hospital Stay: Payer: Medicare HMO

## 2015-11-30 ENCOUNTER — Inpatient Hospital Stay: Payer: Medicare Other

## 2015-12-04 ENCOUNTER — Inpatient Hospital Stay: Payer: Medicare HMO

## 2015-12-07 ENCOUNTER — Inpatient Hospital Stay: Payer: Commercial Managed Care - HMO | Attending: Family Medicine

## 2015-12-07 VITALS — BP 115/76 | HR 59 | Temp 96.3°F | Resp 18

## 2015-12-07 DIAGNOSIS — G35 Multiple sclerosis: Secondary | ICD-10-CM | POA: Insufficient documentation

## 2015-12-07 DIAGNOSIS — Z79899 Other long term (current) drug therapy: Secondary | ICD-10-CM | POA: Diagnosis not present

## 2015-12-07 MED ORDER — HEPARIN SOD (PORK) LOCK FLUSH 100 UNIT/ML IV SOLN
500.0000 [IU] | Freq: Once | INTRAVENOUS | Status: AC
  Administered 2015-12-07: 500 [IU]
  Filled 2015-12-07: qty 5

## 2015-12-07 MED ORDER — SODIUM CHLORIDE 0.9 % IV SOLN
Freq: Once | INTRAVENOUS | Status: AC
  Administered 2015-12-07: 09:00:00 via INTRAVENOUS
  Filled 2015-12-07: qty 1000

## 2015-12-07 MED ORDER — ACETAMINOPHEN 500 MG PO TABS
1000.0000 mg | ORAL_TABLET | Freq: Once | ORAL | Status: AC
Start: 2015-12-07 — End: 2015-12-07
  Administered 2015-12-07: 1000 mg via ORAL
  Filled 2015-12-07: qty 2

## 2015-12-07 MED ORDER — SODIUM CHLORIDE 0.9 % IJ SOLN
10.0000 mL | Freq: Once | INTRAMUSCULAR | Status: AC
  Administered 2015-12-07: 10 mL
  Filled 2015-12-07: qty 10

## 2015-12-07 MED ORDER — SODIUM CHLORIDE 0.9 % IV SOLN
300.0000 mg | Freq: Once | INTRAVENOUS | Status: AC
  Administered 2015-12-07: 300 mg via INTRAVENOUS
  Filled 2015-12-07: qty 15

## 2015-12-18 ENCOUNTER — Encounter (HOSPITAL_COMMUNITY): Payer: Self-pay | Admitting: Cardiology

## 2015-12-18 ENCOUNTER — Emergency Department (HOSPITAL_COMMUNITY)
Admission: EM | Admit: 2015-12-18 | Discharge: 2015-12-18 | Disposition: A | Discharge status: Home / Self Care | Payer: Commercial Managed Care - HMO | Attending: Emergency Medicine | Admitting: Emergency Medicine

## 2015-12-18 DIAGNOSIS — Z7901 Long term (current) use of anticoagulants: Secondary | ICD-10-CM | POA: Insufficient documentation

## 2015-12-18 DIAGNOSIS — R109 Unspecified abdominal pain: Secondary | ICD-10-CM | POA: Insufficient documentation

## 2015-12-18 DIAGNOSIS — G4700 Insomnia, unspecified: Secondary | ICD-10-CM | POA: Insufficient documentation

## 2015-12-18 DIAGNOSIS — F419 Anxiety disorder, unspecified: Secondary | ICD-10-CM | POA: Insufficient documentation

## 2015-12-18 DIAGNOSIS — G894 Chronic pain syndrome: Secondary | ICD-10-CM | POA: Diagnosis not present

## 2015-12-18 DIAGNOSIS — F329 Major depressive disorder, single episode, unspecified: Secondary | ICD-10-CM | POA: Diagnosis not present

## 2015-12-18 DIAGNOSIS — M1990 Unspecified osteoarthritis, unspecified site: Secondary | ICD-10-CM | POA: Diagnosis not present

## 2015-12-18 DIAGNOSIS — Z8701 Personal history of pneumonia (recurrent): Secondary | ICD-10-CM | POA: Insufficient documentation

## 2015-12-18 DIAGNOSIS — Z79899 Other long term (current) drug therapy: Secondary | ICD-10-CM | POA: Diagnosis not present

## 2015-12-18 DIAGNOSIS — I10 Essential (primary) hypertension: Secondary | ICD-10-CM | POA: Insufficient documentation

## 2015-12-18 DIAGNOSIS — K219 Gastro-esophageal reflux disease without esophagitis: Secondary | ICD-10-CM | POA: Insufficient documentation

## 2015-12-18 DIAGNOSIS — G35 Multiple sclerosis: Secondary | ICD-10-CM | POA: Insufficient documentation

## 2015-12-18 DIAGNOSIS — F17210 Nicotine dependence, cigarettes, uncomplicated: Secondary | ICD-10-CM | POA: Diagnosis not present

## 2015-12-18 DIAGNOSIS — G8929 Other chronic pain: Secondary | ICD-10-CM

## 2015-12-18 DIAGNOSIS — Z86718 Personal history of other venous thrombosis and embolism: Secondary | ICD-10-CM | POA: Diagnosis not present

## 2015-12-18 LAB — COMPREHENSIVE METABOLIC PANEL
ALBUMIN: 3.8 g/dL (ref 3.5–5.0)
ALK PHOS: 84 U/L (ref 38–126)
ALT: 32 U/L (ref 17–63)
AST: 26 U/L (ref 15–41)
Anion gap: 14 (ref 5–15)
BUN: 7 mg/dL (ref 6–20)
CALCIUM: 9.7 mg/dL (ref 8.9–10.3)
CO2: 21 mmol/L — AB (ref 22–32)
CREATININE: 0.67 mg/dL (ref 0.61–1.24)
Chloride: 106 mmol/L (ref 101–111)
GFR calc non Af Amer: >60 mL/min (ref >60)
GLUCOSE: 108 mg/dL — AB (ref 65–99)
Potassium: 3.9 mmol/L (ref 3.5–5.1)
SODIUM: 141 mmol/L (ref 135–145)
Total Bilirubin: 0.6 mg/dL (ref 0.3–1.2)
Total Protein: 6.9 g/dL (ref 6.5–8.1)

## 2015-12-18 LAB — CBC
HCT: 41 % (ref 39.0–52.0)
HEMOGLOBIN: 13.4 g/dL (ref 13.0–17.0)
MCH: 27 pg (ref 26.0–34.0)
MCHC: 32.7 g/dL (ref 30.0–36.0)
MCV: 82.5 fL (ref 78.0–100.0)
PLATELETS: 228 10*3/uL (ref 150–400)
RBC: 4.97 MIL/uL (ref 4.22–5.81)
RDW: 16 % — AB (ref 11.5–15.5)
WBC: 7.8 10*3/uL (ref 4.0–10.5)

## 2015-12-18 LAB — LIPASE, BLOOD: Lipase: 27 U/L (ref 11–51)

## 2015-12-18 LAB — PROTIME-INR
INR: 2.16 — AB (ref 0.00–1.49)
PROTHROMBIN TIME: 23.9 s — AB (ref 11.6–15.2)

## 2015-12-18 MED ORDER — SODIUM CHLORIDE 0.9% FLUSH: 10.0000 mL | Freq: Two times a day (BID) | INTRAVENOUS | Status: DC

## 2015-12-18 MED ORDER — HEPARIN SOD (PORK) LOCK FLUSH 100 UNIT/ML IV SOLN
500.0000 [IU] | INTRAVENOUS | Status: DC
  Filled 2015-12-18: qty 5

## 2015-12-18 MED ORDER — PANTOPRAZOLE SODIUM 40 MG PO TBEC: 40.0000 mg | DELAYED_RELEASE_TABLET | Freq: Every day | ORAL | Status: DC

## 2015-12-18 MED ORDER — HEPARIN SOD (PORK) LOCK FLUSH 100 UNIT/ML IV SOLN
500.0000 [IU] | INTRAVENOUS | Status: DC | PRN
  Administered 2015-12-18: 500 [IU]
  Filled 2015-12-18: qty 5

## 2015-12-18 MED ORDER — OXYCODONE-ACETAMINOPHEN 5-325 MG PO TABS
2.0000 | ORAL_TABLET | Freq: Once | ORAL | Status: AC
  Administered 2015-12-18: 2 via ORAL
  Filled 2015-12-18: qty 2

## 2015-12-18 MED ORDER — OXYCODONE-ACETAMINOPHEN 5-325 MG PO TABS: 1.0000 | ORAL_TABLET | Freq: Four times a day (QID) | ORAL | Status: DC | PRN

## 2015-12-18 MED ORDER — FAMOTIDINE 20 MG PO TABS
20.0000 mg | ORAL_TABLET | Freq: Once | ORAL | Status: AC
  Administered 2015-12-18: 20 mg via ORAL
  Filled 2015-12-18: qty 1

## 2015-12-18 NOTE — ED Provider Notes (Addendum)
CSN: [id number]     Arrival date & time 12/18/15  1313 History   First MD Initiated Contact with Patient 12/18/15 1531     Chief Complaint  Patient presents with  . Abdominal Pain     (Consider location/radiation/quality/duration/timing/severity/associated sxs/prior Treatment) Patient is a 55 y.o. male presenting with abdominal pain. The history is provided by the patient.  Abdominal Pain Associated symptoms: no chest pain, no chills, no cough, no diarrhea, no dysuria, no fever, no shortness of breath, no sore throat and no vomiting   Patient w MS, c/o abdominal pain for the past 2 months.   States has seen his neurologist, pain doctor, and his gi doctor for the past 2 months. Pain constant, daily. No acute or abrupt change today. Pain dull moderate. Located mid abdomen.  No specific exacerbating or alleviating factors, with exception of stating that percocet makes better.  States is on percocet for MS.  No abd distension. No hx pud. Having normal bms. No vomiting. No weight loss. Patient indicates out of his pain meds for the past 2 days.       Past Medical History  Diagnosis Date  . Gallstones 02/17/2009  . Multiple sclerosis (HCC)   . Chronic pain syndrome 01/25/2008  . Insomnia 11/06/2008  . Anxiety   . Other specified visual disturbances   . Nonspecific elevation of levels of transaminase or lactic acid dehydrogenase (LDH)   . Trigeminal neuralgia   . Abdominal pain, unspecified site   . Benign paroxysmal positional vertigo   . Other syndromes affecting cervical region   . Memory loss   . Gait abnormality   . DVT (deep venous thrombosis) (HCC)     in the left arm  . Arthritis   . Pneumonia 2009  . Headache(784.0)     cluster headaches frequently-takes Topamax daily  . Joint pain   . Joint swelling   . Chronic back pain   . History of colonoscopy   . Depression     takes Zoloft daily  . GERD (gastroesophageal reflux disease)     takes Omeprazole as needed  . HTN  (hypertension)     takes Lisinopril,Verapamil,and Triamterene HCTZ daily   Past Surgical History  Procedure Laterality Date  . Cholecystectomy  02/20/2009  . Portacath placement N/A 03/27/2014    Procedure: INSERTION PORT-A-CATH;  Surgeon: [name], MD;  Location: [facility] OR;  Service: General;  Laterality: N/A;  . Port a cath revision N/A 07/06/2015    Procedure: Removal and replacement of PORT A CATH;  Surgeon: [name], MD;  Location: [facility] OR;  Service: General;  Laterality: N/A;   Family History  Problem Relation Age of Onset  . Cancer Father   . Diabetes Mother    Social History  Substance Use Topics  . Smoking status: Current Every Day Smoker -- 1.00 packs/day for 38 years    Types: Cigarettes  . Smokeless tobacco: Never Used  . Alcohol Use: No    Review of Systems  Constitutional: Negative for fever and chills.  HENT: Negative for sinus pressure and sore throat.   Eyes: Negative for redness.  Respiratory: Negative for cough and shortness of breath.   Cardiovascular: Negative for chest pain.  Gastrointestinal: Positive for abdominal pain. Negative for vomiting and diarrhea.  Endocrine: Negative for polyuria.  Genitourinary: Negative for dysuria and flank pain.  Musculoskeletal: Negative for back pain and neck pain.  Skin: Negative for rash.  Neurological: Negative for headaches.  Hematological: Does not  bruise/bleed easily.  Psychiatric/Behavioral: Negative for confusion.      Allergies  Review of patient's allergies indicates no known allergies.  Home Medications   Prior to Admission medications   Medication Sig Start Date End Date Taking? Authorizing Provider  baclofen (LIORESAL) 20 MG tablet Take 20 mg by mouth 4 (four) times daily.     Historical Provider, MD  dalfampridine (AMPYRA) 10 MG TB12 Take 10 mg by mouth 2 (two) times daily.    Historical Provider, MD  diphenhydrAMINE (BENADRYL) 25 MG tablet Take 1 tablet (25 mg total) by mouth every 6 (six)  hours. Take with Reglan for migraine headache 08/23/15   Arby Barrette, MD  enoxaparin (LOVENOX) 80 MG/0.8ML injection Inject 80 mg into the skin daily.    Historical Provider, MD  Eszopiclone (ESZOPICLONE) 3 MG TABS Take 3 mg by mouth at bedtime as needed (sleep).     Historical Provider, MD  FLUARIX QUADRIVALENT 0.5 ML injection Inject 1 Dose into the muscle once. 07/31/15   Historical Provider, MD  gabapentin (NEURONTIN) 600 MG tablet Take 600 mg by mouth 4 (four) times daily.     Historical Provider, MD  HYDROcodone-acetaminophen (NORCO) 5-325 MG per tablet Take 1-2 tablets by mouth every 6 (six) hours as needed for moderate pain or severe pain. 07/06/15   [name], MD  lisinopril-hydrochlorothiazide (PRINZIDE,ZESTORETIC) 10-12.5 MG per tablet Take 1 tablet by mouth daily.    Historical Provider, MD  metoCLOPramide (REGLAN) 10 MG tablet Take 1 tablet (10 mg total) by mouth every 8 (eight) hours as needed for nausea (Migraine headache, take with Benadryl.). 08/23/15   Arby Barrette, MD  oxyCODONE-acetaminophen (PERCOCET) 10-325 MG tablet Take 1 tablet by mouth every 4 (four) hours as needed for pain.    Historical Provider, MD  sertraline (ZOLOFT) 50 MG tablet Take 50 mg by mouth daily.     Historical Provider, MD  topiramate (TOPAMAX) 50 MG tablet Take 50 mg by mouth 3 (three) times daily.    Historical Provider, MD  triamterene-hydrochlorothiazide (MAXZIDE-25) 37.5-25 MG per tablet Take 1 tablet by mouth 2 (two) times daily.  02/25/15   Historical Provider, MD  verapamil (CALAN) 80 MG tablet Take 80 mg by mouth 2 (two) times daily.    Historical Provider, MD  warfarin (COUMADIN) 5 MG tablet Take 5 mg by mouth every evening.     Historical Provider, MD   BP 109/73 mmHg  Pulse 80  Temp(Src) 98.6 F (37 C) (Oral)  Resp 18  SpO2 97% Physical Exam  Constitutional: He is oriented to person, place, and time. He appears well-developed and well-nourished. No distress.  HENT:  Mouth/Throat:  Oropharynx is clear and moist.  Eyes: Conjunctivae are normal. No scleral icterus.  Neck: Neck supple. No tracheal deviation present.  Cardiovascular: Normal rate, regular rhythm, normal heart sounds and intact distal pulses.  Exam reveals no gallop and no friction rub.   No murmur heard. Pulmonary/Chest: Effort normal and breath sounds normal. No accessory muscle usage. No respiratory distress.  Abdominal: Soft. Bowel sounds are normal. He exhibits no distension and no mass. There is no tenderness. There is no rebound and no guarding.  Genitourinary:  No cva tenderness  Musculoskeletal: Normal range of motion. He exhibits no edema or tenderness.  Neurological: He is alert and oriented to person, place, and time.  Skin: Skin is warm and dry. No rash noted. He is not diaphoretic.  Psychiatric: He has a normal mood and affect.  Nursing note and vitals  reviewed.   ED Course  Procedures (including critical care time) Labs Review  Results for orders placed or performed during the hospital encounter of 12/18/15  Lipase, blood  Result Value Ref Range   Lipase 27 11 - 51 U/L  Comprehensive metabolic panel  Result Value Ref Range   Sodium 141 135 - 145 mmol/L   Potassium 3.9 3.5 - 5.1 mmol/L   Chloride 106 101 - 111 mmol/L   CO2 21 (L) 22 - 32 mmol/L   Glucose, Bld 108 (H) 65 - 99 mg/dL   BUN 7 6 - 20 mg/dL   Creatinine, Ser 1.61 0.61 - 1.24 mg/dL   Calcium 9.7 8.9 - 09.6 mg/dL   Total Protein 6.9 6.5 - 8.1 g/dL   Albumin 3.8 3.5 - 5.0 g/dL   AST 26 15 - 41 U/L   ALT 32 17 - 63 U/L   Alkaline Phosphatase 84 38 - 126 U/L   Total Bilirubin 0.6 0.3 - 1.2 mg/dL   GFR calc non Af Amer >60 >60 mL/min   GFR calc Af Amer >60 >60 mL/min   Anion gap 14 5 - 15  CBC  Result Value Ref Range   WBC 7.8 4.0 - 10.5 K/uL   RBC 4.97 4.22 - 5.81 MIL/uL   Hemoglobin 13.4 13.0 - 17.0 g/dL   HCT 04.5 40.9 - 81.1 %   MCV 82.5 78.0 - 100.0 fL   MCH 27.0 26.0 - 34.0 pg   MCHC 32.7 30.0 - 36.0 g/dL    RDW 91.4 (H) 78.2 - 15.5 %   Platelets 228 150 - 400 K/uL  Protime-INR  Result Value Ref Range   Prothrombin Time 23.9 (H) 11.6 - 15.2 seconds   INR 2.16 (H) 0.00 - 1.49     I have personally reviewed and evaluated these images and lab results as part of my medical decision-making.    MDM   Iv ns. Labs.  Reviewed nursing notes and prior charts for additional history.   Labs unremarkable.  On review prior charts, pt w numerous ct scans for same abd pain since 2012, all negative for acute process.   Pt also reports prior endoscopy w no definitive dx.  Pt indicates out of chronic pain meds. Pt medicated in ED, and given small quantity pain meds until tomorrow when he may speak with his pcp and/or pain clinic physician about refill of meds.  It does seem odd that pt is being tx w percocet for chronic pain - rec pcp/pain clinic f/u to discuss perhaps other options than rapidly acting pain med.  Pt currently appears stable for d/c.         Cathren Laine, MD 12/18/15 1725

## 2015-12-18 NOTE — Discharge Instructions (Signed)
It was our pleasure to provide your ER care today - we hope that you feel better.  Take protonix (acid blocker medication).  You may also try pepcid or maalox as need for symptom relief.  You may take percocet as need for pain.  Also, do not take tylenol or acetaminophen containing medication when taking percocet.   Should you need additional narcotic pain medication for treatment of your chronic pain, you will need to follow up with your doctor/pain clinic doctor tomorrow - discuss with them pain control options, as from our perspective, percocet would not be a good choice for chronic, long term, pain control.  Return to ER if worse, new symptoms, fevers, persistent vomiting, medical emergency, other concern.  You were given pain medication in the ER - no driving for the next 4 hours.         Abdominal Pain, Adult Many things can cause abdominal pain. Usually, abdominal pain is not caused by a disease and will improve without treatment. It can often be observed and treated at home. Your health care provider will do a physical exam and possibly order blood tests and X-rays to help determine the seriousness of your pain. However, in many cases, more time must pass before a clear cause of the pain can be found. Before that point, your health care provider may not know if you need more testing or further treatment. HOME CARE INSTRUCTIONS Monitor your abdominal pain for any changes. The following actions may help to alleviate any discomfort you are experiencing:  Only take over-the-counter or prescription medicines as directed by your health care provider.  Do not take laxatives unless directed to do so by your health care provider.  Try a clear liquid diet (broth, tea, or water) as directed by your health care provider. Slowly move to a bland diet as tolerated. SEEK MEDICAL CARE IF:  You have unexplained abdominal pain.  You have abdominal pain associated with nausea or diarrhea.  You  have pain when you urinate or have a bowel movement.  You experience abdominal pain that wakes you in the night.  You have abdominal pain that is worsened or improved by eating food.  You have abdominal pain that is worsened with eating fatty foods.  You have a fever. SEEK IMMEDIATE MEDICAL CARE IF:  Your pain does not go away within 2 hours.  You keep throwing up (vomiting).  Your pain is felt only in portions of the abdomen, such as the right side or the left lower portion of the abdomen.  You pass bloody or black tarry stools. MAKE SURE YOU:  Understand these instructions.  Will watch your condition.  Will get help right away if you are not doing well or get worse.   This information is not intended to replace advice given to you by your health care provider. Make sure you discuss any questions you have with your health care provider.   Document Released: 08/06/2005 Document Revised: 07/18/2015 Document Reviewed: 07/06/2013 Elsevier Interactive Patient Education 2016 [employer]   Chronic Pain Chronic pain can be defined as pain that is off and on and lasts for 3-6 months or longer. Many things cause chronic pain, which can make it difficult to make a diagnosis. There are many treatment options available for chronic pain. However, finding a treatment that works well for you may require trying various approaches until the right one is found. Many people benefit from a combination of two or more types of treatment to  control their pain. SYMPTOMS  Chronic pain can occur anywhere in the body and can range from mild to very severe. Some types of chronic pain include:  Headache.  Low back pain.  Cancer pain.  Arthritis pain.  Neurogenic pain. This is pain resulting from damage to nerves. People with chronic pain may also have other symptoms such as:  Depression.  Anger.  Insomnia.  Anxiety. DIAGNOSIS  Your health care provider will help diagnose your condition  over time. In many cases, the initial focus will be on excluding possible conditions that could be causing the pain. Depending on your symptoms, your health care provider may order tests to diagnose your condition. Some of these tests may include:   Blood tests.   CT scan.   MRI.   X-rays.   Ultrasounds.   Nerve conduction studies.  You may need to see a specialist.  TREATMENT  Finding treatment that works well may take time. You may be referred to a pain specialist. He or she may prescribe medicine or therapies, such as:   Mindful meditation or yoga.  Shots (injections) of numbing or pain-relieving medicines into the spine or area of pain.  Local electrical stimulation.  Acupuncture.   Massage therapy.   Aroma, color, light, or sound therapy.   Biofeedback.   Working with a physical therapist to keep from getting stiff.   Regular, gentle exercise.   Cognitive or behavioral therapy.   Group support.  Sometimes, surgery may be recommended.  HOME CARE INSTRUCTIONS   Take all medicines as directed by your health care provider.   Lessen stress in your life by relaxing and doing things such as listening to calming music.   Exercise or be active as directed by your health care provider.   Eat a healthy diet and include things such as vegetables, fruits, fish, and lean meats in your diet.   Keep all follow-up appointments with your health care provider.   Attend a support group with others suffering from chronic pain. SEEK MEDICAL CARE IF:   Your pain gets worse.   You develop a new pain that was not there before.   You cannot tolerate medicines given to you by your health care provider.   You have new symptoms since your last visit with your health care provider.  SEEK IMMEDIATE MEDICAL CARE IF:   You feel weak.   You have decreased sensation or numbness.   You lose control of bowel or bladder function.   Your pain suddenly gets  much worse.   You develop shaking.  You develop chills.  You develop confusion.  You develop chest pain.  You develop shortness of breath.  MAKE SURE YOU:  Understand these instructions.  Will watch your condition.  Will get help right away if you are not doing well or get worse.   This information is not intended to replace advice given to you by your health care provider. Make sure you discuss any questions you have with your health care provider.   Document Released: 07/19/2002 Document Revised: 06/29/2013 Document Reviewed: 04/22/2013 Elsevier Interactive Patient Education [employer]

## 2015-12-18 NOTE — ED Notes (Signed)
Pt reports that he has been having abd pain for the past couple of months. States he has been to see GI specialist but was told this was a part of his MS. Is able to eat and using his pain medication often but has recently run out.

## 2015-12-18 NOTE — ED Notes (Signed)
Patient refused to sign discharge stating he was not given any more pain medication.

## 2015-12-18 NOTE — ED Notes (Signed)
Pt has a port-a-cath and request to wait for lab draw.

## 2015-12-21 ENCOUNTER — Inpatient Hospital Stay: Payer: Medicare HMO

## 2015-12-28 ENCOUNTER — Inpatient Hospital Stay: Payer: Medicare Other

## 2016-01-01 ENCOUNTER — Inpatient Hospital Stay: Payer: Commercial Managed Care - HMO | Attending: Family Medicine

## 2016-01-01 VITALS — BP 111/70 | HR 81 | Temp 97.4°F | Resp 20

## 2016-01-01 DIAGNOSIS — G35 Multiple sclerosis: Secondary | ICD-10-CM | POA: Insufficient documentation

## 2016-01-01 DIAGNOSIS — Z95828 Presence of other vascular implants and grafts: Secondary | ICD-10-CM

## 2016-01-01 DIAGNOSIS — Z79899 Other long term (current) drug therapy: Secondary | ICD-10-CM | POA: Diagnosis not present

## 2016-01-01 MED ORDER — SODIUM CHLORIDE 0.9 % IV SOLN
Freq: Once | INTRAVENOUS | Status: AC
  Administered 2016-01-01: 11:00:00 via INTRAVENOUS
  Filled 2016-01-01: qty 1000

## 2016-01-01 MED ORDER — HEPARIN SOD (PORK) LOCK FLUSH 100 UNIT/ML IV SOLN
500.0000 [IU] | Freq: Once | INTRAVENOUS | Status: AC
  Administered 2016-01-01: 500 [IU] via INTRAVENOUS
  Filled 2016-01-01: qty 5

## 2016-01-01 MED ORDER — SODIUM CHLORIDE 0.9 % IV SOLN
300.0000 mg | Freq: Once | INTRAVENOUS | Status: AC
  Administered 2016-01-01: 300 mg via INTRAVENOUS
  Filled 2016-01-01: qty 15

## 2016-01-01 MED ORDER — SODIUM CHLORIDE 0.9% FLUSH
10.0000 mL | INTRAVENOUS | Status: DC | PRN
  Filled 2016-01-01: qty 10

## 2016-01-01 MED ORDER — ACETAMINOPHEN 500 MG PO TABS
1000.0000 mg | ORAL_TABLET | Freq: Once | ORAL | Status: AC
  Administered 2016-01-01: 1000 mg via ORAL
  Filled 2016-01-01: qty 2

## 2016-01-08 ENCOUNTER — Other Ambulatory Visit
Admission: RE | Admit: 2016-01-08 | Discharge: 2016-01-08 | Disposition: A | Discharge status: Home / Self Care | Payer: Medicare HMO | Source: Ambulatory Visit | Attending: Psychiatry | Admitting: Psychiatry

## 2016-01-08 ENCOUNTER — Inpatient Hospital Stay: Payer: Commercial Managed Care - HMO

## 2016-01-08 DIAGNOSIS — Z79899 Other long term (current) drug therapy: Secondary | ICD-10-CM | POA: Insufficient documentation

## 2016-01-08 DIAGNOSIS — G35 Multiple sclerosis: Secondary | ICD-10-CM | POA: Insufficient documentation

## 2016-01-08 DIAGNOSIS — R748 Abnormal levels of other serum enzymes: Secondary | ICD-10-CM | POA: Diagnosis not present

## 2016-01-08 DIAGNOSIS — D72819 Decreased white blood cell count, unspecified: Secondary | ICD-10-CM | POA: Diagnosis present

## 2016-01-08 DIAGNOSIS — Z95828 Presence of other vascular implants and grafts: Secondary | ICD-10-CM

## 2016-01-08 LAB — COMPREHENSIVE METABOLIC PANEL
ALK PHOS: 78 U/L (ref 38–126)
ALT: 37 U/L (ref 17–63)
AST: 23 U/L (ref 15–41)
Albumin: 3.6 g/dL (ref 3.5–5.0)
Anion gap: 5 (ref 5–15)
BILIRUBIN TOTAL: 0.3 mg/dL (ref 0.3–1.2)
BUN: 11 mg/dL (ref 6–20)
CO2: 26 mmol/L (ref 22–32)
CREATININE: 0.76 mg/dL (ref 0.61–1.24)
Calcium: 8.6 mg/dL — ABNORMAL LOW (ref 8.9–10.3)
Chloride: 106 mmol/L (ref 101–111)
GFR calc Af Amer: >60 mL/min (ref >60)
GLUCOSE: 105 mg/dL — AB (ref 65–99)
Potassium: 3.3 mmol/L — ABNORMAL LOW (ref 3.5–5.1)
Sodium: 137 mmol/L (ref 135–145)
TOTAL PROTEIN: 7.8 g/dL (ref 6.5–8.1)

## 2016-01-08 MED ORDER — SODIUM CHLORIDE 0.9% FLUSH
10.0000 mL | INTRAVENOUS | Status: DC | PRN
  Administered 2016-01-08: 10 mL via INTRAVENOUS
  Filled 2016-01-08: qty 10

## 2016-01-08 MED ORDER — HEPARIN SOD (PORK) LOCK FLUSH 100 UNIT/ML IV SOLN
500.0000 [IU] | Freq: Once | INTRAVENOUS | Status: AC
  Administered 2016-01-08: 500 [IU] via INTRAVENOUS

## 2016-01-08 MED ORDER — HEPARIN SOD (PORK) LOCK FLUSH 100 UNIT/ML IV SOLN
INTRAVENOUS | Status: AC
  Filled 2016-01-08: qty 5

## 2016-01-09 ENCOUNTER — Other Ambulatory Visit: Payer: Self-pay | Admitting: *Deleted

## 2016-01-10 LAB — HEPATITIS PANEL, ACUTE
HCV Ab: >11 s/co ratio — ABNORMAL HIGH (ref 0.0–0.9)
HEP A IGM: NEGATIVE
HEP B C IGM: NEGATIVE
HEP B S AG: NEGATIVE

## 2016-01-10 LAB — QUANTIFERON IN TUBE
QFT TB AG MINUS NIL VALUE: 6.02 IU/mL
QUANTIFERON MITOGEN VALUE: 8.67 IU/mL
QUANTIFERON NIL VALUE: 0.06 [IU]/mL
QUANTIFERON TB AG VALUE: 6.08 [IU]/mL
QUANTIFERON TB GOLD: POSITIVE — AB

## 2016-01-10 LAB — MISC LABCORP TEST (SEND OUT): Labcorp test code: 139370

## 2016-01-10 LAB — QUANTIFERON TB GOLD ASSAY (BLOOD)

## 2016-01-16 ENCOUNTER — Other Ambulatory Visit
Admission: RE | Admit: 2016-01-16 | Discharge: 2016-01-16 | Disposition: A | Discharge status: Home / Self Care | Payer: Medicare Other | Source: Ambulatory Visit | Attending: Psychiatry | Admitting: Psychiatry

## 2016-01-16 ENCOUNTER — Inpatient Hospital Stay: Payer: Medicare HMO | Attending: Oncology

## 2016-01-16 ENCOUNTER — Other Ambulatory Visit: Payer: Self-pay

## 2016-01-16 DIAGNOSIS — Z79899 Other long term (current) drug therapy: Secondary | ICD-10-CM | POA: Insufficient documentation

## 2016-01-16 DIAGNOSIS — G35 Multiple sclerosis: Secondary | ICD-10-CM | POA: Insufficient documentation

## 2016-01-17 LAB — T-HELPER CELLS CD4/CD8 %
% CD 4 Pos. Lymph.: 38.9 % (ref 30.8–58.5)
Absolute CD 4 Helper: 1478 /uL (ref 359–1519)
BASOS ABS: 0.1 10*3/uL (ref 0.0–0.2)
BASOS: 1 %
CD3+CD4+ Cells/CD3+CD8+ Cells Bld: 2.22 (ref 0.92–3.72)
CD3+CD8+ CELLS # BLD: 665 /uL (ref 109–897)
CD3+CD8+ CELLS NFR BLD: 17.5 % (ref 12.0–35.5)
EOS (ABSOLUTE): 0.2 10*3/uL (ref 0.0–0.4)
EOS: 3 %
Hematocrit: 38.8 % (ref 37.5–51.0)
Hemoglobin: 13.2 g/dL (ref 12.6–17.7)
IMMATURE GRANULOCYTES: 1 %
Immature Grans (Abs): 0.1 10*3/uL (ref 0.0–0.1)
LYMPHS ABS: 3.8 10*3/uL — AB (ref 0.7–3.1)
Lymphs: 59 %
MCH: 27 pg (ref 26.6–33.0)
MCHC: 34 g/dL (ref 31.5–35.7)
MCV: 80 fL (ref 79–97)
MONOS ABS: 0.6 10*3/uL (ref 0.1–0.9)
Monocytes: 9 %
NEUTROS ABS: 1.8 10*3/uL (ref 1.4–7.0)
NEUTROS PCT: 27 %
PLATELETS: 300 10*3/uL (ref 150–379)
RBC: 4.88 x10E6/uL (ref 4.14–5.80)
RDW: 16.1 % — ABNORMAL HIGH (ref 12.3–15.4)
WBC: 6.5 10*3/uL (ref 3.4–10.8)

## 2016-01-18 ENCOUNTER — Inpatient Hospital Stay: Payer: Commercial Managed Care - HMO

## 2016-01-25 ENCOUNTER — Inpatient Hospital Stay: Payer: Medicare Other

## 2016-01-29 ENCOUNTER — Inpatient Hospital Stay: Payer: Medicare HMO

## 2016-01-29 VITALS — BP 111/71 | HR 67 | Temp 98.9°F | Resp 18

## 2016-01-29 DIAGNOSIS — G35 Multiple sclerosis: Secondary | ICD-10-CM | POA: Diagnosis not present

## 2016-01-29 DIAGNOSIS — Z79899 Other long term (current) drug therapy: Secondary | ICD-10-CM | POA: Diagnosis not present

## 2016-01-29 MED ORDER — SODIUM CHLORIDE 0.9 % IV SOLN
Freq: Once | INTRAVENOUS | Status: AC
  Administered 2016-01-29: 11:00:00 via INTRAVENOUS
  Filled 2016-01-29: qty 1000

## 2016-01-29 MED ORDER — SODIUM CHLORIDE 0.9 % IV SOLN
300.0000 mg | Freq: Once | INTRAVENOUS | Status: AC
  Administered 2016-01-29: 300 mg via INTRAVENOUS
  Filled 2016-01-29: qty 15

## 2016-01-29 MED ORDER — ACETAMINOPHEN 500 MG PO TABS
1000.0000 mg | ORAL_TABLET | Freq: Once | ORAL | Status: AC
  Administered 2016-01-29: 1000 mg via ORAL
  Filled 2016-01-29: qty 2

## 2016-01-29 MED ORDER — HEPARIN SOD (PORK) LOCK FLUSH 100 UNIT/ML IV SOLN
500.0000 [IU] | Freq: Once | INTRAVENOUS | Status: AC
  Administered 2016-01-29: 500 [IU] via INTRAVENOUS
  Filled 2016-01-29: qty 5

## 2016-01-29 MED ORDER — SODIUM CHLORIDE 0.9% FLUSH
10.0000 mL | INTRAVENOUS | Status: DC | PRN
  Administered 2016-01-29: 10 mL via INTRAVENOUS
  Filled 2016-01-29: qty 10

## 2016-02-04 ENCOUNTER — Encounter: Payer: Self-pay | Admitting: Internal Medicine

## 2016-02-04 ENCOUNTER — Ambulatory Visit
Admission: RE | Admit: 2016-02-04 | Discharge: 2016-02-04 | Disposition: A | Discharge status: Home / Self Care | Payer: Medicare HMO | Source: Ambulatory Visit | Attending: Internal Medicine | Admitting: Internal Medicine

## 2016-02-04 ENCOUNTER — Ambulatory Visit (INDEPENDENT_AMBULATORY_CARE_PROVIDER_SITE_OTHER): Payer: Medicare HMO | Admitting: Internal Medicine

## 2016-02-04 VITALS — BP 92/59 | HR 62

## 2016-02-04 DIAGNOSIS — R7611 Nonspecific reaction to tuberculin skin test without active tuberculosis: Secondary | ICD-10-CM | POA: Diagnosis not present

## 2016-02-04 DIAGNOSIS — Z227 Latent tuberculosis: Secondary | ICD-10-CM

## 2016-02-04 LAB — COMPLETE METABOLIC PANEL WITH GFR
ALBUMIN: 4.2 g/dL (ref 3.6–5.1)
ALK PHOS: 89 U/L (ref 40–115)
ALT: 27 U/L (ref 9–46)
AST: 25 U/L (ref 10–35)
BILIRUBIN TOTAL: 0.5 mg/dL (ref 0.2–1.2)
BUN: 9 mg/dL (ref 7–25)
CALCIUM: 9.2 mg/dL (ref 8.6–10.3)
CO2: 17 mmol/L — ABNORMAL LOW (ref 20–31)
Chloride: 107 mmol/L (ref 98–110)
Creat: 0.76 mg/dL (ref 0.70–1.33)
Glucose, Bld: 85 mg/dL (ref 65–99)
POTASSIUM: 4.2 mmol/L (ref 3.5–5.3)
Sodium: 139 mmol/L (ref 135–146)
TOTAL PROTEIN: 7.5 g/dL (ref 6.1–8.1)

## 2016-02-04 MED ORDER — ISONIAZID 300 MG PO TABS: 300.0000 mg | ORAL_TABLET | Freq: Every day | ORAL | Status: DC

## 2016-02-04 MED ORDER — PYRIDOXINE HCL 50 MG PO TABS: 50.0000 mg | ORAL_TABLET | Freq: Every day | ORAL | Status: DC

## 2016-02-04 NOTE — Progress Notes (Signed)
RFV: community referral for LTBI Subjective:    Patient ID: [name], male    DOB: [birth year], 55 y.o.   MRN: [id number]  HPI  [name] is a 55yo M with relapsing MS who will be getting rituxan for further management and had quantiferon blood work which is positive. He does not recall ever receiving treatment for latent or active tb.He was incarcerated roughly 17 years ago and he remembers being housed with someone who had TB, and recalls everyone being screened. He denies having taking treatment for 9 months at that time.  He denies fever, nightsweats, cough, does have occ chills  No Known Allergies Current Outpatient Prescriptions on File Prior to Visit  Medication Sig Dispense Refill  . baclofen (LIORESAL) 20 MG tablet Take 20 mg by mouth 4 (four) times daily.     [name] dalfampridine (AMPYRA) 10 MG TB12 Take 10 mg by mouth 2 (two) times daily.    . Eszopiclone (ESZOPICLONE) 3 MG TABS Take 3 mg by mouth at bedtime as needed (sleep).     . gabapentin (NEURONTIN) 600 MG tablet Take 600 mg by mouth 4 (four) times daily.     [name] levETIRAcetam (KEPPRA) 500 MG tablet Take 500 mg by mouth 2 (two) times daily.  5  . lisinopril-hydrochlorothiazide (PRINZIDE,ZESTORETIC) 10-12.5 MG per tablet Take 1 tablet by mouth daily.    [name] omeprazole (PRILOSEC) 20 MG capsule Take 20 mg by mouth daily.  3  . Oxcarbazepine (TRILEPTAL) 300 MG tablet Take 300 mg by mouth 3 (three) times daily.  5  . oxyCODONE (OXY IR/ROXICODONE) 5 MG immediate release tablet Take 5 mg by mouth daily.    [name] oxyCODONE-acetaminophen (PERCOCET) 10-325 MG tablet Take 1 tablet by mouth every 4 (four) hours as needed for pain.    [name] oxyCODONE-acetaminophen (PERCOCET/ROXICET) 5-325 MG tablet Take 1-2 tablets by mouth every 6 (six) hours as needed for severe pain. 6 tablet 0  . pantoprazole (PROTONIX) 40 MG tablet Take 1 tablet (40 mg total) by mouth daily. 20 tablet 0  . sertraline (ZOLOFT) 50 MG tablet Take 50 mg by mouth daily.     [name]  topiramate (TOPAMAX) 50 MG tablet Take 50 mg by mouth 3 (three) times daily.    [name] triamterene-hydrochlorothiazide (MAXZIDE-25) 37.5-25 MG per tablet Take 1 tablet by mouth 2 (two) times daily.     . verapamil (CALAN) 80 MG tablet Take 80 mg by mouth 2 (two) times daily.    [name] warfarin (COUMADIN) 5 MG tablet Take 5 mg by mouth every evening.      Current Facility-Administered Medications on File Prior to Visit  Medication Dose Route Frequency Provider Last Rate Last Dose  . sodium chloride 0.9 % injection 10 mL  10 mL Intracatheter PRN Loann Quill, NP   10 mL at 11/07/15 1012   Active Ambulatory Problems    Diagnosis Date Noted  . ABDOMINAL BLOATING 10/14/2010  . LOOSE STOOLS 10/14/2010  . PULMONARY EMBOLISM, HX OF 10/14/2010  . Multiple sclerosis (HCC) 08/02/2013   Resolved Ambulatory Problems    Diagnosis Date Noted  . No Resolved Ambulatory Problems   Past Medical History  Diagnosis Date  . Gallstones 02/17/2009  . Chronic pain syndrome 01/25/2008  . Insomnia 11/06/2008  . Anxiety   . Other specified visual disturbances   . Nonspecific elevation of levels of transaminase or lactic acid dehydrogenase (LDH)   . Trigeminal neuralgia   . Abdominal pain, unspecified site   . Benign paroxysmal positional  vertigo   . Other syndromes affecting cervical region   . Memory loss   . Gait abnormality   . DVT (deep venous thrombosis) (HCC)   . Arthritis   . Pneumonia 2009  . Headache(784.0)   . Joint pain   . Joint swelling   . Chronic back pain   . History of colonoscopy   . Depression   . GERD (gastroesophageal reflux disease)   . HTN (hypertension)    Social History  Substance Use Topics  . Smoking status: Current Every Day Smoker -- 1.00 packs/day for 38 years    Types: Cigarettes  . Smokeless tobacco: Never Used  . Alcohol Use: No  family history includes Cancer in his father; Diabetes in his mother.  Review of Systems  Constitutional: + for chills. Negative for  fever, diaphoresis, activity change, appetite change, fatigue and unexpected weight change.  HENT: Negative for congestion, sore throat, rhinorrhea, sneezing, trouble swallowing and sinus pressure.  Eyes: Negative for photophobia and visual disturbance.  Respiratory: Negative for cough, chest tightness, shortness of breath, wheezing and stridor.  Cardiovascular: Negative for chest pain, palpitations and leg swelling.  Gastrointestinal: Negative for nausea, vomiting, abdominal pain, diarrhea, constipation, blood in stool, abdominal distention and anal bleeding.  Genitourinary: Negative for dysuria, hematuria, flank pain and difficulty urinating.  Musculoskeletal: Negative for myalgias, back pain, joint swelling, arthralgias and gait problem.  Skin: Negative for color change, pallor, rash and wound.  Neurological: LE weakness from MS Hematological: Negative for adenopathy. Does not bruise/bleed easily.  Psychiatric/Behavioral: Negative for behavioral problems, confusion, sleep disturbance, dysphoric mood, decreased concentration and agitation.       Objective:   Physical Exam Blood pressure 92/59, pulse 62. Physical Exam  Constitutional: He is oriented to person, place, and time. He appears well-developed and well-nourished. No distress.  HENT:  Mouth/Throat: Oropharynx is clear and moist. No oropharyngeal exudate.  Cardiovascular: Normal rate, regular rhythm and normal heart sounds. Exam reveals no gallop and no friction rub.  No murmur heard.  Pulmonary/Chest: Effort normal and breath sounds normal. No respiratory distress. He has no wheezes.  Abdominal: Soft. Bowel sounds are normal. He exhibits no distension. There is no tenderness.  Lymphadenopathy:  He has no cervical adenopathy.  Neurological: He is alert and oriented to person, place, and time.  Skin: Skin is warm and dry. No rash noted. No erythema.  Psychiatric: He has a normal mood and affect. His behavior is normal.   CMP  Latest Ref Rng 02/04/2016 01/08/2016 12/18/2015  Glucose 65 - 99 mg/dL 85 161(W) 960(A)  BUN 7 - 25 mg/dL Creatinine 0.70 - 1.33 mg/dL 5.40 9.81 1.91  Sodium 135 - 146 mmol/L 139 137 141  Potassium 3.5 - 5.3 mmol/L 4.2 3.3(L) 3.9  Chloride 98 - 110 mmol/L 107 106 106  CO2 20 - 31 mmol/L 17(L) 26 21(L)  Calcium 8.6 - 10.3 mg/dL 9.2 4.7(W) 9.7  Total Protein 6.1 - 8.1 g/dL 7.5 7.8 6.9  Total Bilirubin 0.2 - 1.2 mg/dL 0.5 0.3 0.6  Alkaline Phos 40 - 115 U/L 89 78 84  AST 10 - 35 U/L ALT 9 - 46 U/L 27 37 32     Reviewed CXR that did not show evidence of infiltrate suggestive of active tb FINDINGS: PowerPort catheter in stable position. Mediastinum hilar structures normal. Lungs are clear. Heart size normal. Mild bibasilar subsegmental atelectasis. No pleural effusion or pneumothorax .  IMPRESSION: 1. Power port catheter  in good anatomic position.  2. Low lung volumes with mild bibasilar subsegmental atelectasis.      Assessment & Plan:  Latent tb = based on his med profile, we will recommend to give inh 300mg  daily plus vitamin b6 supplementation. Will get baseline cmp and CXR   He would be ok to start getting rituxan in roughly 8 wk after being on ltbi treatment.   rtc in 4-6 wk to see how he is tolerating medication  Spent 40 min with patient reviewing records, and counseling on LTBI

## 2016-02-05 LAB — CBC WITH DIFFERENTIAL/PLATELET
BASOS PCT: 1 % (ref 0–1)
Basophils Absolute: 0.1 10*3/uL (ref 0.0–0.1)
EOS ABS: 0.1 10*3/uL (ref 0.0–0.7)
Eosinophils Relative: 2 % (ref 0–5)
HCT: 41.3 % (ref 39.0–52.0)
HEMOGLOBIN: 13.6 g/dL (ref 13.0–17.0)
Lymphocytes Relative: 53 % — ABNORMAL HIGH (ref 12–46)
Lymphs Abs: 3.8 10*3/uL (ref 0.7–4.0)
MCH: 27.1 pg (ref 26.0–34.0)
MCHC: 32.9 g/dL (ref 30.0–36.0)
MCV: 82.3 fL (ref 78.0–100.0)
MONO ABS: 0.4 10*3/uL (ref 0.1–1.0)
MONOS PCT: 6 % (ref 3–12)
MPV: 8.8 fL (ref 8.6–12.4)
NEUTROS ABS: 2.7 10*3/uL (ref 1.7–7.7)
NEUTROS PCT: 38 % — AB (ref 43–77)
PLATELETS: 79 10*3/uL — AB (ref 150–400)
RBC: 5.02 MIL/uL (ref 4.22–5.81)
RDW: 16.4 % — ABNORMAL HIGH (ref 11.5–15.5)
WBC: 7.1 10*3/uL (ref 4.0–10.5)

## 2016-02-15 ENCOUNTER — Encounter (HOSPITAL_COMMUNITY): Payer: Self-pay

## 2016-02-15 ENCOUNTER — Emergency Department (HOSPITAL_COMMUNITY): Payer: Medicare HMO

## 2016-02-15 ENCOUNTER — Inpatient Hospital Stay: Payer: Commercial Managed Care - HMO

## 2016-02-15 ENCOUNTER — Telehealth: Payer: Self-pay | Admitting: *Deleted

## 2016-02-15 ENCOUNTER — Observation Stay (HOSPITAL_COMMUNITY)
Admission: EM | Admit: 2016-02-15 | Discharge: 2016-02-17 | Disposition: A | Discharge status: Home / Self Care | Payer: Medicare HMO | Attending: Internal Medicine | Admitting: Internal Medicine

## 2016-02-15 DIAGNOSIS — F419 Anxiety disorder, unspecified: Secondary | ICD-10-CM | POA: Insufficient documentation

## 2016-02-15 DIAGNOSIS — G35 Multiple sclerosis: Secondary | ICD-10-CM | POA: Insufficient documentation

## 2016-02-15 DIAGNOSIS — R7611 Nonspecific reaction to tuberculin skin test without active tuberculosis: Secondary | ICD-10-CM | POA: Insufficient documentation

## 2016-02-15 DIAGNOSIS — R0902 Hypoxemia: Secondary | ICD-10-CM | POA: Insufficient documentation

## 2016-02-15 DIAGNOSIS — Z86711 Personal history of pulmonary embolism: Secondary | ICD-10-CM | POA: Insufficient documentation

## 2016-02-15 DIAGNOSIS — I82409 Acute embolism and thrombosis of unspecified deep veins of unspecified lower extremity: Secondary | ICD-10-CM | POA: Diagnosis present

## 2016-02-15 DIAGNOSIS — F329 Major depressive disorder, single episode, unspecified: Secondary | ICD-10-CM | POA: Diagnosis not present

## 2016-02-15 DIAGNOSIS — G44029 Chronic cluster headache, not intractable: Secondary | ICD-10-CM

## 2016-02-15 DIAGNOSIS — K219 Gastro-esophageal reflux disease without esophagitis: Secondary | ICD-10-CM | POA: Diagnosis not present

## 2016-02-15 DIAGNOSIS — Z9049 Acquired absence of other specified parts of digestive tract: Secondary | ICD-10-CM | POA: Diagnosis not present

## 2016-02-15 DIAGNOSIS — Z7901 Long term (current) use of anticoagulants: Secondary | ICD-10-CM | POA: Insufficient documentation

## 2016-02-15 DIAGNOSIS — H8110 Benign paroxysmal vertigo, unspecified ear: Secondary | ICD-10-CM | POA: Insufficient documentation

## 2016-02-15 DIAGNOSIS — R001 Bradycardia, unspecified: Secondary | ICD-10-CM | POA: Insufficient documentation

## 2016-02-15 DIAGNOSIS — F32A Depression, unspecified: Secondary | ICD-10-CM | POA: Diagnosis present

## 2016-02-15 DIAGNOSIS — J189 Pneumonia, unspecified organism: Principal | ICD-10-CM | POA: Insufficient documentation

## 2016-02-15 DIAGNOSIS — Z23 Encounter for immunization: Secondary | ICD-10-CM | POA: Insufficient documentation

## 2016-02-15 DIAGNOSIS — Y95 Nosocomial condition: Secondary | ICD-10-CM | POA: Diagnosis not present

## 2016-02-15 DIAGNOSIS — I10 Essential (primary) hypertension: Secondary | ICD-10-CM | POA: Diagnosis not present

## 2016-02-15 DIAGNOSIS — G44001 Cluster headache syndrome, unspecified, intractable: Secondary | ICD-10-CM | POA: Diagnosis present

## 2016-02-15 DIAGNOSIS — Z227 Latent tuberculosis: Secondary | ICD-10-CM | POA: Insufficient documentation

## 2016-02-15 DIAGNOSIS — F17210 Nicotine dependence, cigarettes, uncomplicated: Secondary | ICD-10-CM | POA: Insufficient documentation

## 2016-02-15 DIAGNOSIS — Z86718 Personal history of other venous thrombosis and embolism: Secondary | ICD-10-CM | POA: Diagnosis not present

## 2016-02-15 DIAGNOSIS — E876 Hypokalemia: Secondary | ICD-10-CM | POA: Diagnosis not present

## 2016-02-15 DIAGNOSIS — Z9181 History of falling: Secondary | ICD-10-CM | POA: Insufficient documentation

## 2016-02-15 DIAGNOSIS — R51 Headache: Secondary | ICD-10-CM | POA: Insufficient documentation

## 2016-02-15 HISTORY — DX: Cluster headache syndrome, unspecified, not intractable: G44.009

## 2016-02-15 MED ORDER — KETOROLAC TROMETHAMINE 30 MG/ML IJ SOLN
30.0000 mg | Freq: Once | INTRAMUSCULAR | Status: AC
  Administered 2016-02-16: 30 mg via INTRAVENOUS
  Filled 2016-02-15: qty 1

## 2016-02-15 MED ORDER — SODIUM CHLORIDE 0.9 % IV BOLUS (SEPSIS)
500.0000 mL | Freq: Once | INTRAVENOUS | Status: AC
  Administered 2016-02-16: 500 mL via INTRAVENOUS

## 2016-02-15 NOTE — Telephone Encounter (Signed)
Can you ask him to take at bedtime to see if still symptomatic

## 2016-02-15 NOTE — ED Notes (Signed)
Pt states he has fallen multiple times since going on the Medication for TB.

## 2016-02-15 NOTE — ED Notes (Signed)
Bed: WA04 Expected date:  Expected time:  Means of arrival:  Comments: 81 M HA

## 2016-02-15 NOTE — ED Provider Notes (Signed)
CSN: [id number]     Arrival date & time 02/15/16  2230 History  By signing my name below, I, [name], attest that this documentation has been prepared under the direction and in the presence of [name], MD. Electronically Signed: [name], ED Scribe. 02/15/2016. 11:41 PM.    Chief Complaint  Patient presents with  . Headache   Patient is a 55 y.o. male presenting with headaches. The history is provided by the patient. No language interpreter was used.  Headache Pain location:  Frontal Quality: throbbing. Radiates to:  Does not radiate Onset quality:  Gradual Duration:  24 hours Timing:  Constant Progression:  Worsening Chronicity:  New Relieved by:  Nothing Worsened by:  Nothing Ineffective treatments: Percocet. Associated symptoms: no abdominal pain, no blurred vision, no fever and no vomiting    HPI Comments: [name] is a 55 y.o. male with a hx of MS and HTN who presents to the Emergency Department complaining of gradually worsening throbbing cluster headache over right eye onset today. He states that he took 7 Percocets over the course of today with no relief. Pt was recently diagnosed with TB but he states that he has not been complaint with his medication because there were "messing with his brain". He explains that he has had multiple falls and weakness consistent with his MS flare up onset 2 weeks ago. He states that he has been on his MS medication for approx. 8 months. Pt has upcoming PCP appointment next month. No fever, abdominal pain, blurred vision, or vomiting.   Past Medical History  Diagnosis Date  . Gallstones 02/17/2009  . Multiple sclerosis (HCC)   . Chronic pain syndrome 01/25/2008  . Insomnia 11/06/2008  . Anxiety   . Other specified visual disturbances   . Nonspecific elevation of levels of transaminase or lactic acid dehydrogenase (LDH)   . Trigeminal neuralgia   . Abdominal pain, unspecified site   . Benign paroxysmal  positional vertigo   . Other syndromes affecting cervical region   . Memory loss   . Gait abnormality   . DVT (deep venous thrombosis) (HCC)     in the left arm  . Arthritis   . Pneumonia 2009  . Headache(784.0)     cluster headaches frequently-takes Topamax daily  . Joint pain   . Joint swelling   . Chronic back pain   . History of colonoscopy   . Depression     takes Zoloft daily  . GERD (gastroesophageal reflux disease)     takes Omeprazole as needed  . HTN (hypertension)     takes Lisinopril,Verapamil,and Triamterene HCTZ daily   Past Surgical History  Procedure Laterality Date  . Cholecystectomy  02/20/2009  . Portacath placement N/A 03/27/2014    Procedure: INSERTION PORT-A-CATH;  Surgeon: [name], MD;  Location: [facility] OR;  Service: General;  Laterality: N/A;  . Port a cath revision N/A 07/06/2015    Procedure: Removal and replacement of PORT A CATH;  Surgeon: [name], MD;  Location: [facility] OR;  Service: General;  Laterality: N/A;   Family History  Problem Relation Age of Onset  . Cancer Father   . Diabetes Mother    Social History  Substance Use Topics  . Smoking status: Current Every Day Smoker -- 1.00 packs/day for 38 years    Types: Cigarettes  . Smokeless tobacco: Never Used  . Alcohol Use: No    Review of Systems  Constitutional: Negative for fever.  Eyes: Negative for  blurred vision.  Gastrointestinal: Negative for vomiting and abdominal pain.  Neurological: Positive for headaches.  All other systems reviewed and are negative.     Allergies  Review of patient's allergies indicates no known allergies.  Home Medications   Prior to Admission medications   Medication Sig Start Date End Date Taking? Authorizing Provider  baclofen (LIORESAL) 20 MG tablet Take 20 mg by mouth 4 (four) times daily as needed for muscle spasms.    Yes Historical Provider, MD  dalfampridine (AMPYRA) 10 MG TB12 Take 10 mg by mouth 2 (two) times daily.   Yes  Historical Provider, MD  Eszopiclone (ESZOPICLONE) 3 MG TABS Take 3 mg by mouth at bedtime as needed (for sleep).    Yes Historical Provider, MD  gabapentin (NEURONTIN) 600 MG tablet Take 600 mg by mouth 4 (four) times daily.    Yes Historical Provider, MD  isoniazid (NYDRAZID) 300 MG tablet Take 1 tablet (300 mg total) by mouth daily. 02/04/16  Yes Judyann Munson, MD  levETIRAcetam (KEPPRA) 500 MG tablet Take 500 mg by mouth 2 (two) times daily.   Yes Historical Provider, MD  lisinopril-hydrochlorothiazide (PRINZIDE,ZESTORETIC) 10-12.5 MG per tablet Take 1 tablet by mouth daily.   Yes Historical Provider, MD  omeprazole (PRILOSEC) 20 MG capsule Take 20 mg by mouth daily.   Yes Historical Provider, MD  Oxcarbazepine (TRILEPTAL) 300 MG tablet Take 300 mg by mouth 3 (three) times daily.   Yes Historical Provider, MD  oxyCODONE-acetaminophen (PERCOCET) 10-325 MG tablet Take 1-2 tablets by mouth every 6 (six) hours as needed for pain.    Yes Historical Provider, MD  pyridOXINE ([employer] VITAMIN B-6) 50 MG tablet Take 1 tablet (50 mg total) by mouth daily. 02/04/16  Yes Judyann Munson, MD  sertraline (ZOLOFT) 50 MG tablet Take 50 mg by mouth daily.    Yes Historical Provider, MD  triamterene-hydrochlorothiazide (MAXZIDE-25) 37.5-25 MG per tablet Take 1 tablet by mouth daily.    Yes Historical Provider, MD  verapamil (CALAN) 80 MG tablet Take 80 mg by mouth 2 (two) times daily.   Yes Historical Provider, MD  warfarin (COUMADIN) 5 MG tablet Take 5 mg by mouth every evening.    Yes Historical Provider, MD  oxyCODONE-acetaminophen (PERCOCET/ROXICET) 5-325 MG tablet Take 1-2 tablets by mouth every 6 (six) hours as needed for severe pain. Patient not taking: Reported on 02/15/2016 12/18/15   Cathren Laine, MD  pantoprazole (PROTONIX) 40 MG tablet Take 1 tablet (40 mg total) by mouth daily. Patient not taking: Reported on 02/15/2016 12/18/15   Cathren Laine, MD   BP 120/74 mmHg  Pulse 71  Temp(Src) 97.8 F (36.6 C) (Oral)   Resp 20  SpO2 97% Physical Exam  Constitutional: He is oriented to person, place, and time. He appears well-developed and well-nourished. No distress.  HENT:  Head: Normocephalic and atraumatic.  Eyes: Conjunctivae and EOM are normal. Pupils are equal, round, and reactive to light.  Neck: Neck supple. No tracheal deviation present.  Cardiovascular: Normal rate and regular rhythm.   Pulmonary/Chest: Effort normal and breath sounds normal. No respiratory distress.  Lungs are clear  Abdominal: Soft. Bowel sounds are normal. There is no rebound and no guarding.  Musculoskeletal: Normal range of motion.  Neurological: He is alert and oriented to person, place, and time. He has normal reflexes.  Skin: Skin is warm and dry.  Psychiatric: He has a normal mood and affect. His behavior is normal.  Nursing note and vitals reviewed.   ED Course  Procedures (including critical care time) DIAGNOSTIC STUDIES: Oxygen Saturation is 97% on RA, normal by my interpretation.    COORDINATION OF CARE: 11:16 PM- Pt advised of plan for treatment and pt agrees. Pt will receive oxygen therapy, IV fluids and Toradol. He will also receive lab work. CT head, and chest x-ray for further evaluation.    Labs Review Labs Reviewed  CBC WITH DIFFERENTIAL/PLATELET  PROTIME-INR  ACETAMINOPHEN LEVEL  SALICYLATE LEVEL  I-STAT CHEM 8, ED    Imaging Review No results found.   Kaysha Parsell, MD has personally reviewed and evaluated these images and lab results as part of her medical decision-making.    MDM   Final diagnoses:  None   BP 120/74 mmHg  Pulse 71  Temp(Src) 97.8 F (36.6 C) (Oral)  Resp 20  SpO2 97%  Filed Vitals:   02/16/16 0230 02/16/16 0300  BP: 103/72 115/85  Pulse: 59 62  Temp:    Resp:     Results for orders placed or performed during the hospital encounter of 02/15/16  CBC with Differential/Platelet  Result Value Ref Range   WBC 12.4 (H) 4.0 - 10.5 K/uL   RBC 4.46 4.22 -  5.81 MIL/uL   Hemoglobin 12.0 (L) 13.0 - 17.0 g/dL   HCT 62.2 (L) 29.7 - 98.9 %   MCV 82.3 78.0 - 100.0 fL   MCH 26.9 26.0 - 34.0 pg   MCHC 32.7 30.0 - 36.0 g/dL   RDW 21.1 (H) 94.1 - 74.0 %   Platelets 236 150 - 400 K/uL   Neutrophils Relative % 47 %   Lymphocytes Relative 44 %   Monocytes Relative 7 %   Eosinophils Relative 2 %   Basophils Relative 0 %   Neutro Abs 5.8 1.7 - 7.7 K/uL   Lymphs Abs 5.5 (H) 0.7 - 4.0 K/uL   Monocytes Absolute 0.9 0.1 - 1.0 K/uL   Eosinophils Absolute 0.2 0.0 - 0.7 K/uL   Basophils Absolute 0.0 0.0 - 0.1 K/uL   WBC Morphology INCREASED BANDS (>20% BANDS)   Protime-INR  Result Value Ref Range   Prothrombin Time 21.5 (H) 11.6 - 15.2 seconds   INR 1.95 (H) 0.00 - 1.49  Acetaminophen level  Result Value Ref Range   Acetaminophen (Tylenol), Serum <10 (L) 10 - 30 ug/mL  Salicylate level  Result Value Ref Range   Salicylate Lvl <4.0 2.8 - 30.0 mg/dL  I-Stat Chem 8, ED  Result Value Ref Range   Sodium 144 135 - 145 mmol/L   Potassium 3.0 (L) 3.5 - 5.1 mmol/L   Chloride 107 101 - 111 mmol/L   BUN 11 6 - 20 mg/dL   Creatinine, Ser 8.14 0.61 - 1.24 mg/dL   Glucose, Bld 97 65 - 99 mg/dL   Calcium, Ion 4.81 8.56 - 1.23 mmol/L   TCO2 23 0 - 100 mmol/L   Hemoglobin 12.6 (L) 13.0 - 17.0 g/dL   HCT 31.4 (L) 97.0 - 26.3 %   Dg Chest 2 View  02/16/2016  CLINICAL DATA:  Left-sided chest pain and worsening dyspnea for 2 weeks. EXAM: CHEST  2 VIEW COMPARISON:  02/04/2016 FINDINGS: There is a right jugular power port with tip in the low SVC. There are basilar opacities bilaterally, new. This may represent pneumonia, particularly in the posterior lower lobes were there is confluent consolidation. No effusion. Normal pulmonary vasculature. IMPRESSION: Lower lobe consolidation bilaterally, suspicious for pneumonia. Followup PA and lateral chest X-ray is recommended in 3-4 weeks following trial of  antibiotic therapy to ensure resolution and exclude underlying  malignancy. Electronically Signed   By: Ellery Plunk M.D.   On: 02/16/2016 00:38   Dg Chest 2 View  02/04/2016  CLINICAL DATA:  Positive PPD. EXAM: CHEST  2 VIEW COMPARISON:  07/06/2015. FINDINGS: PowerPort catheter in stable position. Mediastinum hilar structures normal. Lungs are clear. Heart size normal. Mild bibasilar subsegmental atelectasis. No pleural effusion or pneumothorax . IMPRESSION: 1. Power port catheter in good anatomic position. 2.  Low lung volumes with mild bibasilar subsegmental atelectasis. Electronically Signed   By: Maisie Fus  Register   On: 02/04/2016 11:44   Ct Head Wo Contrast  02/16/2016  CLINICAL DATA:  Change in headache pattern. EXAM: CT HEAD WITHOUT CONTRAST TECHNIQUE: Contiguous axial images were obtained from the base of the skull through the vertex without intravenous contrast. COMPARISON:  08/23/2015 FINDINGS: There is no intracranial hemorrhage, mass or evidence of acute infarction. There is no extra-axial fluid collection. Gray matter and white matter appear normal. Cerebral volume is normal for age. Brainstem and posterior fossa are unremarkable. The CSF spaces appear normal. The bony structures are intact. The visible portions of the paranasal sinuses are clear. IMPRESSION: Normal brain Electronically Signed   By: Ellery Plunk M.D.   On: 02/16/2016 01:40    Placed on high flow O2 as this is the treatment for cluster headaches and patient has already taken 7 percocet for same.  Given weakness and falls CT was performed as was CXR in the setting of weakness with TB and stopped his meds.  B PNA on immune suppressants will need admission.  PEr Dr. Clyde Lundborg inpatient med surg negative pressure  Medications  vancomycin (VANCOCIN) IVPB 1000 mg/200 mL premix (1,000 mg Intravenous New Bag/Given 02/16/16 0324)  Warfarin - Pharmacist Dosing Inpatient (not administered)  isoniazid (NYDRAZID) tablet 300 mg (not administered)  pyridOXINE (VITAMIN B-6) tablet 50 mg (not  administered)  levETIRAcetam (KEPPRA) tablet 500 mg (not administered)  pantoprazole (PROTONIX) EC tablet 40 mg (not administered)  Oxcarbazepine (TRILEPTAL) tablet 300 mg (not administered)  dalfampridine TB12 10 mg (not administered)  zolpidem (AMBIEN) tablet 5 mg (not administered)  verapamil (CALAN) tablet 80 mg (not administered)  baclofen (LIORESAL) tablet 20 mg (not administered)  gabapentin (NEURONTIN) capsule 600 mg (not administered)  sertraline (ZOLOFT) tablet 50 mg (not administered)  potassium chloride 20 MEQ/15ML (10%) solution 40 mEq (not administered)  hydrALAZINE (APRESOLINE) injection 5 mg (not administered)  sodium chloride 0.9 % bolus 2,000 mL (not administered)  0.9 %  sodium chloride infusion (not administered)  lisinopril (PRINIVIL,ZESTRIL) tablet 10 mg (not administered)  nicotine (NICODERM CQ - dosed in mg/24 hours) patch 21 mg (not administered)  enoxaparin (LOVENOX) injection 40 mg (not administered)  oxyCODONE-acetaminophen (PERCOCET/ROXICET) 5-325 MG per tablet 1-2 tablet (not administered)    And  oxyCODONE (Oxy IR/ROXICODONE) immediate release tablet 5-10 mg (not administered)  sodium chloride 0.9 % bolus 500 mL (0 mLs Intravenous Stopped 02/16/16 0155)  ketorolac (TORADOL) 30 MG/ML injection 30 mg (30 mg Intravenous Given 02/16/16 0019)  piperacillin-tazobactam (ZOSYN) IVPB 3.375 g (0 g Intravenous Stopped 02/16/16 0322)    I personally performed the services described in this documentation, which was scribed in my presence. The recorded information has been reviewed and is accurate.     Cy Blamer, MD 02/16/16 [phone]

## 2016-02-15 NOTE — Telephone Encounter (Signed)
Patient called to advise that he has been dizzy and fallen 2 times since starting Isoniazid 300 mg. He reports no other symptoms.  He wants to know what he should do or if he should continue taking the medication. Advised him will ask the doctor and call him back once she responds.

## 2016-02-15 NOTE — ED Notes (Addendum)
Per EMS- pt c/o cluster headaches. 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 Stopped taking tb medication because of MS symptom exacerbation, headaches returned. Started taking more pain medication than usual for headaches, 7 percocets instead of 4. Hx of MS.

## 2016-02-16 ENCOUNTER — Emergency Department (HOSPITAL_COMMUNITY): Payer: Medicare HMO

## 2016-02-16 ENCOUNTER — Encounter (HOSPITAL_COMMUNITY): Payer: Self-pay | Admitting: Internal Medicine

## 2016-02-16 ENCOUNTER — Inpatient Hospital Stay (HOSPITAL_COMMUNITY): Payer: Medicare HMO

## 2016-02-16 DIAGNOSIS — G44001 Cluster headache syndrome, unspecified, intractable: Secondary | ICD-10-CM | POA: Diagnosis present

## 2016-02-16 DIAGNOSIS — J189 Pneumonia, unspecified organism: Secondary | ICD-10-CM | POA: Diagnosis not present

## 2016-02-16 DIAGNOSIS — K219 Gastro-esophageal reflux disease without esophagitis: Secondary | ICD-10-CM | POA: Insufficient documentation

## 2016-02-16 DIAGNOSIS — D72829 Elevated white blood cell count, unspecified: Secondary | ICD-10-CM

## 2016-02-16 DIAGNOSIS — R918 Other nonspecific abnormal finding of lung field: Secondary | ICD-10-CM

## 2016-02-16 DIAGNOSIS — I10 Essential (primary) hypertension: Secondary | ICD-10-CM | POA: Insufficient documentation

## 2016-02-16 DIAGNOSIS — R7611 Nonspecific reaction to tuberculin skin test without active tuberculosis: Secondary | ICD-10-CM

## 2016-02-16 DIAGNOSIS — G44019 Episodic cluster headache, not intractable: Secondary | ICD-10-CM | POA: Diagnosis not present

## 2016-02-16 DIAGNOSIS — F329 Major depressive disorder, single episode, unspecified: Secondary | ICD-10-CM | POA: Diagnosis present

## 2016-02-16 DIAGNOSIS — F32A Depression, unspecified: Secondary | ICD-10-CM | POA: Diagnosis present

## 2016-02-16 DIAGNOSIS — I82409 Acute embolism and thrombosis of unspecified deep veins of unspecified lower extremity: Secondary | ICD-10-CM | POA: Diagnosis present

## 2016-02-16 DIAGNOSIS — E876 Hypokalemia: Secondary | ICD-10-CM | POA: Diagnosis present

## 2016-02-16 LAB — I-STAT CHEM 8, ED
BUN: 11 mg/dL (ref 6–20)
CHLORIDE: 107 mmol/L (ref 101–111)
CREATININE: 0.8 mg/dL (ref 0.61–1.24)
Calcium, Ion: 1.13 mmol/L (ref 1.12–1.23)
Glucose, Bld: 97 mg/dL (ref 65–99)
HEMATOCRIT: 37 % — AB (ref 39.0–52.0)
Hemoglobin: 12.6 g/dL — ABNORMAL LOW (ref 13.0–17.0)
Potassium: 3 mmol/L — ABNORMAL LOW (ref 3.5–5.1)
SODIUM: 144 mmol/L (ref 135–145)
TCO2: 23 mmol/L (ref 0–100)

## 2016-02-16 LAB — LACTIC ACID, PLASMA: Lactic Acid, Venous: 1 mmol/L (ref 0.5–2.0)

## 2016-02-16 LAB — PROTIME-INR
INR: 1.95 — AB (ref 0.00–1.49)
INR: 2.06 — ABNORMAL HIGH (ref 0.00–1.49)
Prothrombin Time: 21.5 seconds — ABNORMAL HIGH (ref 11.6–15.2)
Prothrombin Time: 22.4 seconds — ABNORMAL HIGH (ref 11.6–15.2)

## 2016-02-16 LAB — URINALYSIS, ROUTINE W REFLEX MICROSCOPIC
Bilirubin Urine: NEGATIVE
GLUCOSE, UA: NEGATIVE mg/dL
Hgb urine dipstick: NEGATIVE
KETONES UR: NEGATIVE mg/dL
LEUKOCYTES UA: NEGATIVE
Nitrite: NEGATIVE
PH: 6 (ref 5.0–8.0)
Protein, ur: NEGATIVE mg/dL
SPECIFIC GRAVITY, URINE: 1.017 (ref 1.005–1.030)

## 2016-02-16 LAB — CBC WITH DIFFERENTIAL/PLATELET
BASOS ABS: 0 10*3/uL (ref 0.0–0.1)
BASOS PCT: 0 %
EOS ABS: 0.2 10*3/uL (ref 0.0–0.7)
Eosinophils Relative: 2 %
HEMATOCRIT: 36.7 % — AB (ref 39.0–52.0)
HEMOGLOBIN: 12 g/dL — AB (ref 13.0–17.0)
LYMPHS PCT: 44 %
Lymphs Abs: 5.5 10*3/uL — ABNORMAL HIGH (ref 0.7–4.0)
MCH: 26.9 pg (ref 26.0–34.0)
MCHC: 32.7 g/dL (ref 30.0–36.0)
MCV: 82.3 fL (ref 78.0–100.0)
MONOS PCT: 7 %
Monocytes Absolute: 0.9 10*3/uL (ref 0.1–1.0)
NEUTROS PCT: 47 %
Neutro Abs: 5.8 10*3/uL (ref 1.7–7.7)
Platelets: 236 10*3/uL (ref 150–400)
RBC: 4.46 MIL/uL (ref 4.22–5.81)
RDW: 15.8 % — ABNORMAL HIGH (ref 11.5–15.5)
WBC MORPHOLOGY: INCREASED
WBC: 12.4 10*3/uL — ABNORMAL HIGH (ref 4.0–10.5)

## 2016-02-16 LAB — ACETAMINOPHEN LEVEL

## 2016-02-16 LAB — HIV ANTIBODY (ROUTINE TESTING W REFLEX): HIV SCREEN 4TH GENERATION: NONREACTIVE

## 2016-02-16 LAB — PROCALCITONIN: Procalcitonin: <0.1 ng/mL

## 2016-02-16 LAB — SALICYLATE LEVEL

## 2016-02-16 LAB — INFLUENZA PANEL BY PCR (TYPE A & B)
H1N1FLUPCR: NOT DETECTED
Influenza A By PCR: NEGATIVE
Influenza B By PCR: NEGATIVE

## 2016-02-16 LAB — MAGNESIUM: MAGNESIUM: 1.9 mg/dL (ref 1.7–2.4)

## 2016-02-16 LAB — STREP PNEUMONIAE URINARY ANTIGEN: STREP PNEUMO URINARY ANTIGEN: NEGATIVE

## 2016-02-16 LAB — APTT: aPTT: 38 seconds — ABNORMAL HIGH (ref 24–37)

## 2016-02-16 MED ORDER — ISONIAZID 300 MG PO TABS
300.0000 mg | ORAL_TABLET | Freq: Every day | ORAL | Status: DC
  Filled 2016-02-16: qty 1

## 2016-02-16 MED ORDER — PIPERACILLIN-TAZOBACTAM 3.375 G IVPB
3.3750 g | Freq: Three times a day (TID) | INTRAVENOUS | Status: DC
  Administered 2016-02-16: 3.375 g via INTRAVENOUS
  Filled 2016-02-16: qty 50

## 2016-02-16 MED ORDER — VERAPAMIL HCL 80 MG PO TABS
80.0000 mg | ORAL_TABLET | Freq: Two times a day (BID) | ORAL | Status: DC
  Administered 2016-02-16 – 2016-02-17 (×3): 80 mg via ORAL
  Filled 2016-02-16 (×5): qty 1

## 2016-02-16 MED ORDER — CEFIXIME 400 MG PO CAPS
400.0000 mg | ORAL_CAPSULE | Freq: Every day | ORAL | Status: DC
  Administered 2016-02-16 – 2016-02-17 (×2): 400 mg via ORAL
  Filled 2016-02-16 (×2): qty 1

## 2016-02-16 MED ORDER — NICOTINE 21 MG/24HR TD PT24
21.0000 mg | MEDICATED_PATCH | Freq: Every day | TRANSDERMAL | Status: DC
  Administered 2016-02-16 – 2016-02-17 (×2): 21 mg via TRANSDERMAL
  Filled 2016-02-16 (×2): qty 1

## 2016-02-16 MED ORDER — GABAPENTIN 300 MG PO CAPS
600.0000 mg | ORAL_CAPSULE | Freq: Four times a day (QID) | ORAL | Status: DC
  Administered 2016-02-16 – 2016-02-17 (×6): 600 mg via ORAL
  Filled 2016-02-16 (×9): qty 2

## 2016-02-16 MED ORDER — PANTOPRAZOLE SODIUM 40 MG PO TBEC
40.0000 mg | DELAYED_RELEASE_TABLET | Freq: Every day | ORAL | Status: DC
  Administered 2016-02-16 – 2016-02-17 (×2): 40 mg via ORAL
  Filled 2016-02-16 (×3): qty 1

## 2016-02-16 MED ORDER — WARFARIN - PHARMACIST DOSING INPATIENT: Freq: Every day | Status: DC

## 2016-02-16 MED ORDER — DALFAMPRIDINE ER 10 MG PO TB12
10.0000 mg | ORAL_TABLET | Freq: Two times a day (BID) | ORAL | Status: DC
Start: 2016-02-16 — End: 2016-02-17

## 2016-02-16 MED ORDER — BACLOFEN 10 MG PO TABS
20.0000 mg | ORAL_TABLET | Freq: Four times a day (QID) | ORAL | Status: DC | PRN
  Administered 2016-02-16: 20 mg via ORAL
  Filled 2016-02-16: qty 1

## 2016-02-16 MED ORDER — OXYCODONE-ACETAMINOPHEN 10-325 MG PO TABS: 1.0000 | ORAL_TABLET | Freq: Four times a day (QID) | ORAL | Status: DC | PRN

## 2016-02-16 MED ORDER — OXYCODONE-ACETAMINOPHEN 5-325 MG PO TABS
1.0000 | ORAL_TABLET | Freq: Four times a day (QID) | ORAL | Status: DC | PRN
  Administered 2016-02-16: 2 via ORAL
  Administered 2016-02-16: 1 via ORAL
  Filled 2016-02-16: qty 1
  Filled 2016-02-16: qty 2

## 2016-02-16 MED ORDER — ENOXAPARIN SODIUM 40 MG/0.4ML ~~LOC~~ SOLN
40.0000 mg | Freq: Every day | SUBCUTANEOUS | Status: DC
  Administered 2016-02-16: 40 mg via SUBCUTANEOUS
  Filled 2016-02-16: qty 0.4

## 2016-02-16 MED ORDER — PIPERACILLIN-TAZOBACTAM 3.375 G IVPB 30 MIN
3.3750 g | Freq: Once | INTRAVENOUS | Status: AC
  Administered 2016-02-16: 3.375 g via INTRAVENOUS
  Filled 2016-02-16: qty 50

## 2016-02-16 MED ORDER — SERTRALINE HCL 50 MG PO TABS
50.0000 mg | ORAL_TABLET | Freq: Every day | ORAL | Status: DC
  Administered 2016-02-16 – 2016-02-17 (×2): 50 mg via ORAL
  Filled 2016-02-16 (×2): qty 1

## 2016-02-16 MED ORDER — OXYCODONE-ACETAMINOPHEN 5-325 MG PO TABS
1.0000 | ORAL_TABLET | ORAL | Status: DC | PRN
  Administered 2016-02-16 – 2016-02-17 (×5): 1 via ORAL
  Filled 2016-02-16 (×5): qty 1

## 2016-02-16 MED ORDER — POTASSIUM CHLORIDE 20 MEQ/15ML (10%) PO SOLN
40.0000 meq | Freq: Once | ORAL | Status: AC
  Administered 2016-02-16: 40 meq via ORAL
  Filled 2016-02-16: qty 30

## 2016-02-16 MED ORDER — VANCOMYCIN HCL IN DEXTROSE 1-5 GM/200ML-% IV SOLN
1000.0000 mg | Freq: Once | INTRAVENOUS | Status: AC
  Administered 2016-02-16: 1000 mg via INTRAVENOUS
  Filled 2016-02-16: qty 200

## 2016-02-16 MED ORDER — OXCARBAZEPINE 300 MG PO TABS
300.0000 mg | ORAL_TABLET | Freq: Three times a day (TID) | ORAL | Status: DC
  Administered 2016-02-16 – 2016-02-17 (×4): 300 mg via ORAL
  Filled 2016-02-16 (×6): qty 1

## 2016-02-16 MED ORDER — OXYCODONE HCL 5 MG PO TABS
5.0000 mg | ORAL_TABLET | Freq: Four times a day (QID) | ORAL | Status: DC | PRN
  Administered 2016-02-16: 5 mg via ORAL
  Filled 2016-02-16: qty 1

## 2016-02-16 MED ORDER — LISINOPRIL 10 MG PO TABS
10.0000 mg | ORAL_TABLET | Freq: Every day | ORAL | Status: DC
  Administered 2016-02-16 – 2016-02-17 (×2): 10 mg via ORAL
  Filled 2016-02-16 (×2): qty 1

## 2016-02-16 MED ORDER — OXYCODONE HCL 5 MG PO TABS
5.0000 mg | ORAL_TABLET | ORAL | Status: DC | PRN
  Administered 2016-02-16 – 2016-02-17 (×4): 5 mg via ORAL
  Filled 2016-02-16 (×4): qty 1

## 2016-02-16 MED ORDER — HYDRALAZINE HCL 20 MG/ML IJ SOLN
5.0000 mg | INTRAMUSCULAR | Status: DC | PRN
Start: 2016-02-16 — End: 2016-02-17

## 2016-02-16 MED ORDER — PNEUMOCOCCAL VAC POLYVALENT 25 MCG/0.5ML IJ INJ
0.5000 mL | INJECTION | INTRAMUSCULAR | Status: AC
  Administered 2016-02-17: 0.5 mL via INTRAMUSCULAR
  Filled 2016-02-16 (×3): qty 0.5

## 2016-02-16 MED ORDER — WARFARIN SODIUM 5 MG PO TABS
5.0000 mg | ORAL_TABLET | Freq: Once | ORAL | Status: AC
  Administered 2016-02-16: 5 mg via ORAL
  Filled 2016-02-16 (×2): qty 1

## 2016-02-16 MED ORDER — VANCOMYCIN HCL IN DEXTROSE 1-5 GM/200ML-% IV SOLN
1000.0000 mg | Freq: Three times a day (TID) | INTRAVENOUS | Status: DC
  Administered 2016-02-16: 1000 mg via INTRAVENOUS
  Filled 2016-02-16: qty 200

## 2016-02-16 MED ORDER — IOPAMIDOL (ISOVUE-370) INJECTION 76%
80.0000 mL | Freq: Once | INTRAVENOUS | Status: AC | PRN
  Administered 2016-02-16: 80 mL via INTRAVENOUS

## 2016-02-16 MED ORDER — ZOLPIDEM TARTRATE 5 MG PO TABS: 5.0000 mg | ORAL_TABLET | Freq: Every evening | ORAL | Status: DC | PRN

## 2016-02-16 MED ORDER — SODIUM CHLORIDE 0.9 % IV BOLUS (SEPSIS)
2000.0000 mL | Freq: Once | INTRAVENOUS | Status: AC
  Administered 2016-02-16: 2000 mL via INTRAVENOUS

## 2016-02-16 MED ORDER — VITAMIN B-6 50 MG PO TABS
50.0000 mg | ORAL_TABLET | Freq: Every day | ORAL | Status: DC
  Administered 2016-02-16: 50 mg via ORAL
  Filled 2016-02-16: qty 1

## 2016-02-16 MED ORDER — SODIUM CHLORIDE 0.9 % IV SOLN
INTRAVENOUS | Status: DC
  Administered 2016-02-16 (×2): via INTRAVENOUS

## 2016-02-16 MED ORDER — KETOROLAC TROMETHAMINE 15 MG/ML IJ SOLN
15.0000 mg | Freq: Three times a day (TID) | INTRAMUSCULAR | Status: DC | PRN
  Administered 2016-02-16 (×2): 15 mg via INTRAVENOUS
  Filled 2016-02-16 (×2): qty 1

## 2016-02-16 MED ORDER — LEVETIRACETAM 500 MG PO TABS
500.0000 mg | ORAL_TABLET | Freq: Two times a day (BID) | ORAL | Status: DC
  Administered 2016-02-16 – 2016-02-17 (×3): 500 mg via ORAL
  Filled 2016-02-16 (×4): qty 1

## 2016-02-16 NOTE — Progress Notes (Signed)
ANTICOAGULATION CONSULT NOTE - Initial Consult  Pharmacy Consult for Warfarin Indication: DVT  No Known Allergies  Patient Measurements: Weight: 190 lb 0.6 oz (86.2 kg) Vital Signs: Temp: 97.8 F (36.6 C) (04/08 0914) Temp Source: Oral (04/08 0914) BP: 101/73 mmHg (04/08 0914) Pulse Rate: 72 (04/08 0914)  Labs:  Recent Labs  02/16/16 0008 02/16/16 0037 02/16/16 0400 02/16/16 0443  HGB 12.0* 12.6*  --   --   HCT 36.7* 37.0*  --   --   PLT 236  --   --   --   APTT  --   --  38*  --   LABPROT 21.5*  --   --  22.4*  INR 1.95*  --   --  2.06*  CREATININE  --  0.80  --   --    Estimated Creatinine Clearance: 111.1 mL/min (by C-G formula based on Cr of 0.8).  Medical History: Past Medical History  Diagnosis Date  . Gallstones 02/17/2009  . Multiple sclerosis (HCC)   . Chronic pain syndrome 01/25/2008  . Insomnia 11/06/2008  . Anxiety   . Other specified visual disturbances   . Nonspecific elevation of levels of transaminase or lactic acid dehydrogenase (LDH)   . Trigeminal neuralgia   . Abdominal pain, unspecified site   . Benign paroxysmal positional vertigo   . Other syndromes affecting cervical region   . Memory loss   . Gait abnormality   . DVT (deep venous thrombosis) (HCC)     in the left arm  . Arthritis   . Pneumonia 2009  . Headache(784.0)     cluster headaches frequently-takes Topamax daily  . Joint pain   . Joint swelling   . Chronic back pain   . History of colonoscopy   . Depression     takes Zoloft daily  . GERD (gastroesophageal reflux disease)     takes Omeprazole as needed  . HTN (hypertension)     takes Lisinopril,Verapamil,and Triamterene HCTZ daily  . Cluster headache    Medications:  Scheduled:  . dalfampridine  10 mg Oral BID  . gabapentin  600 mg Oral QID  . isoniazid  300 mg Oral Daily  . levETIRAcetam  500 mg Oral BID  . lisinopril  10 mg Oral Daily  . nicotine  21 mg Transdermal Daily  . Oxcarbazepine  300 mg Oral TID  .  pantoprazole  40 mg Oral Daily  . pyridOXINE  50 mg Oral Daily  . sertraline  50 mg Oral Daily  . verapamil  80 mg Oral BID  . Warfarin - Pharmacist Dosing Inpatient   Does not apply q1800   Infusions:  . sodium chloride 100 mL/hr at 02/16/16 0437  . piperacillin-tazobactam (ZOSYN)  IV    . vancomycin     Assessment:  55 yr male with h/o MS, TB, cluster headaches presents with c/o headaches.  Noted patient not compliant with TB meds due to side effects.  Patient on Warfarin  daily for h/o DVT (last reported dose was on 4/7 @ 4pm)  INR upon admission = 1.95 (slightly subtherapeutic)  Today, 02/16/2016  Note that Lovenox  daily ordered, given x1 ~ 0500, order discontinued  INR 2.06, in therapeutic range  Regular diet ordered,   Goal of Therapy:  INR 2-3   Plan:   Daily PT/INR  Warfarin  x1 at 1800 today  Otho Bellows PharmD Pager [phone] 02/16/2016, 10:28 AM

## 2016-02-16 NOTE — H&P (Signed)
[facility] History and Physical  [name] ZOX:[id number] DOB: Jan 17, 1961 DOA: 02/15/2016  Referring physician: ED physician PCP: Dorrene German, MD  Specialists:   Chief Complaint: Headache and generalized weakness  HPI: [name] is a 55 y.o. male with PMH of multiple sclerosis on monthly Natalizumab, cluster headache, hypertension, GERD, depression, tobacco abuse, DVT and PE on Coumadin, who presents with HA.   Pt was recently treated with isoniazide for latent TB, but stopped taking it because he felt that the medication made his MS worse. She has been having intermittent headache in the past 10 days,which he attributes to cluster headache. His headache is located in the right frontal area, intermittent, nonradiating. He has chronic neck pain due to MS, which has not changed. No photophobia. He has generalized weakness, and had multiple falls without head injury. Patient does not have cough, fever, chills, chest pain. He was found to have oxygen saturation to 91% in the emergency room. No nausea, vomiting, diarrhea, abdominal pain.  In ED, patient was found to have WBC 12.4, lactate 1.0, temperature normal, slightly bradycardia, potassium 3.0, creatinine normal, INR 1.5, CT head is negative for acute intracranial abnormalities. Chest x-ray showed bilateral lower lobe infiltration. Patient is admitted to inpatient for further evaluation treatment. Negative air pressure room is requested.  EKG: not done in ED, will get one  Where does patient live?   At home Can patient participate in ADLs? barely  Review of Systems:   General: no fevers, chills, no changes in body weight, has poor appetite, has fatigue. Has HA HEENT: no blurry vision, hearing changes or sore throat Pulm: no dyspnea, coughing, wheezing CV: no chest pain, no palpitations Abd: no nausea, vomiting, abdominal pain, diarrhea, constipation GU: no dysuria, burning on urination, increased urinary frequency,  hematuria  Ext: no leg edema Neuro: no unilateral weakness, numbness, or tingling, no vision change or hearing loss Skin: no rash MSK: No muscle spasm, no deformity, no limitation of range of movement in spin Heme: No easy bruising.  Travel history: No recent long distant travel.  Allergy: No Known Allergies  Past Medical History  Diagnosis Date  . Gallstones 02/17/2009  . Multiple sclerosis (HCC)   . Chronic pain syndrome 01/25/2008  . Insomnia 11/06/2008  . Anxiety   . Other specified visual disturbances   . Nonspecific elevation of levels of transaminase or lactic acid dehydrogenase (LDH)   . Trigeminal neuralgia   . Abdominal pain, unspecified site   . Benign paroxysmal positional vertigo   . Other syndromes affecting cervical region   . Memory loss   . Gait abnormality   . DVT (deep venous thrombosis) (HCC)     in the left arm  . Arthritis   . Pneumonia 2009  . Headache(784.0)     cluster headaches frequently-takes Topamax daily  . Joint pain   . Joint swelling   . Chronic back pain   . History of colonoscopy   . Depression     takes Zoloft daily  . GERD (gastroesophageal reflux disease)     takes Omeprazole as needed  . HTN (hypertension)     takes Lisinopril,Verapamil,and Triamterene HCTZ daily  . Cluster headache     Past Surgical History  Procedure Laterality Date  . Cholecystectomy  02/20/2009  . Portacath placement N/A 03/27/2014    Procedure: INSERTION PORT-A-CATH;  Surgeon: [name], MD;  Location: [facility] OR;  Service: General;  Laterality: N/A;  . Port a cath revision N/A 07/06/2015  Procedure: Removal and replacement of PORT A CATH;  Surgeon: [name], MD;  Location: [facility] OR;  Service: General;  Laterality: N/A;    Social History:  reports that he has been smoking Cigarettes.  He has a 38 pack-year smoking history. He has never used smokeless tobacco. He reports that he does not drink alcohol or use illicit drugs.  Family History:  Family  History  Problem Relation Age of Onset  . Cancer Father   . Diabetes Mother      Prior to Admission medications   Medication Sig Start Date End Date Taking? Authorizing Provider  baclofen (LIORESAL) 20 MG tablet Take 20 mg by mouth 4 (four) times daily as needed for muscle spasms.    Yes Historical Provider, MD  dalfampridine (AMPYRA) 10 MG TB12 Take 10 mg by mouth 2 (two) times daily.   Yes Historical Provider, MD  Eszopiclone (ESZOPICLONE) 3 MG TABS Take 3 mg by mouth at bedtime as needed (for sleep).    Yes Historical Provider, MD  gabapentin (NEURONTIN) 600 MG tablet Take 600 mg by mouth 4 (four) times daily.    Yes Historical Provider, MD  isoniazid (NYDRAZID) 300 MG tablet Take 1 tablet (300 mg total) by mouth daily. 02/04/16  Yes Judyann Munson, MD  levETIRAcetam (KEPPRA) 500 MG tablet Take 500 mg by mouth 2 (two) times daily.   Yes Historical Provider, MD  lisinopril-hydrochlorothiazide (PRINZIDE,ZESTORETIC) 10-12.5 MG per tablet Take 1 tablet by mouth daily.   Yes Historical Provider, MD  omeprazole (PRILOSEC) 20 MG capsule Take 20 mg by mouth daily.   Yes Historical Provider, MD  Oxcarbazepine (TRILEPTAL) 300 MG tablet Take 300 mg by mouth 3 (three) times daily.   Yes Historical Provider, MD  oxyCODONE-acetaminophen (PERCOCET) 10-325 MG tablet Take 1-2 tablets by mouth every 6 (six) hours as needed for pain.    Yes Historical Provider, MD  pyridOXINE ([employer] VITAMIN B-6) 50 MG tablet Take 1 tablet (50 mg total) by mouth daily. 02/04/16  Yes Judyann Munson, MD  sertraline (ZOLOFT) 50 MG tablet Take 50 mg by mouth daily.    Yes Historical Provider, MD  triamterene-hydrochlorothiazide (MAXZIDE-25) 37.5-25 MG per tablet Take 1 tablet by mouth daily.    Yes Historical Provider, MD  verapamil (CALAN) 80 MG tablet Take 80 mg by mouth 2 (two) times daily.   Yes Historical Provider, MD  warfarin (COUMADIN) 5 MG tablet Take 5 mg by mouth every evening.    Yes Historical Provider, MD   oxyCODONE-acetaminophen (PERCOCET/ROXICET) 5-325 MG tablet Take 1-2 tablets by mouth every 6 (six) hours as needed for severe pain. Patient not taking: Reported on 02/15/2016 12/18/15   Cathren Laine, MD  pantoprazole (PROTONIX) 40 MG tablet Take 1 tablet (40 mg total) by mouth daily. Patient not taking: Reported on 02/15/2016 12/18/15   Cathren Laine, MD    Physical Exam: Filed Vitals:   02/16/16 0500 02/16/16 0530 02/16/16 0600 02/16/16 0608  BP: 105/66 104/63 107/60 107/60  Pulse: 64 65 67 67  Temp:      TempSrc:      Resp:    18  Weight:      SpO2: 95% 98% 96% 96%   General: Not in acute distress HEENT:       Eyes: PERRL, EOMI, no scleral icterus.       ENT: No discharge from the ears and nose, no pharynx injection, no tonsillar enlargement.        Neck: No JVD, no bruit, no mass  felt. Heme: No neck lymph node enlargement. Cardiac: S1/S2, RRR, No murmurs, No gallops or rubs. Pulm:  No rales, wheezing, rhonchi or rubs. Has port-A line over R upper chest with his surroundings. Abd: Soft, nondistended, nontender, no rebound pain, no organomegaly, BS present. Ext: No pitting leg edema bilaterally. 2+DP/PT pulse bilaterally. Musculoskeletal: No joint deformities, No joint redness or warmth, no limitation of ROM in spin. Skin: No rashes.  Neuro: Alert, oriented X3, cranial nerves II-XII grossly intact, moves all extremities normally.  Psych: Patient is not psychotic, no suicidal or hemocidal ideation.  Labs on Admission:  Basic Metabolic Panel:  Recent Labs Lab 02/16/16 0037 02/16/16 0443  NA 144  --   K 3.0*  --   CL 107  --   GLUCOSE 97  --   BUN 11  --   CREATININE 0.80  --   MG  --  1.9   Liver Function Tests: No results for input(s): AST, ALT, ALKPHOS, BILITOT, PROT, ALBUMIN in the last 168 hours. No results for input(s): LIPASE, AMYLASE in the last 168 hours. No results for input(s): AMMONIA in the last 168 hours. CBC:  Recent Labs Lab 02/16/16 0008 02/16/16 0037   WBC 12.4*  --   NEUTROABS 5.8  --   HGB 12.0* 12.6*  HCT 36.7* 37.0*  MCV 82.3  --   PLT 236  --    Cardiac Enzymes: No results for input(s): CKTOTAL, CKMB, CKMBINDEX, TROPONINI in the last 168 hours.  BNP (last 3 results)  Recent Labs  05/28/15 2140  BNP 8.0    ProBNP (last 3 results) No results for input(s): PROBNP in the last 8760 hours.  CBG: No results for input(s): GLUCAP in the last 168 hours.  Radiological Exams on Admission: Dg Chest 2 View  02/16/2016  CLINICAL DATA:  Left-sided chest pain and worsening dyspnea for 2 weeks. EXAM: CHEST  2 VIEW COMPARISON:  02/04/2016 FINDINGS: There is a right jugular power port with tip in the low SVC. There are basilar opacities bilaterally, new. This may represent pneumonia, particularly in the posterior lower lobes were there is confluent consolidation. No effusion. Normal pulmonary vasculature. IMPRESSION: Lower lobe consolidation bilaterally, suspicious for pneumonia. Followup PA and lateral chest X-ray is recommended in 3-4 weeks following trial of antibiotic therapy to ensure resolution and exclude underlying malignancy. Electronically Signed   By: Ellery Plunk M.D.   On: 02/16/2016 00:38   Ct Head Wo Contrast  02/16/2016  CLINICAL DATA:  Change in headache pattern. EXAM: CT HEAD WITHOUT CONTRAST TECHNIQUE: Contiguous axial images were obtained from the base of the skull through the vertex without intravenous contrast. COMPARISON:  08/23/2015 FINDINGS: There is no intracranial hemorrhage, mass or evidence of acute infarction. There is no extra-axial fluid collection. Gray matter and white matter appear normal. Cerebral volume is normal for age. Brainstem and posterior fossa are unremarkable. The CSF spaces appear normal. The bony structures are intact. The visible portions of the paranasal sinuses are clear. IMPRESSION: Normal brain Electronically Signed   By: Ellery Plunk M.D.   On: 02/16/2016 01:40     Assessment/Plan Principal Problem:   HCAP (healthcare-associated pneumonia) Active Problems:   PULMONARY EMBOLISM, HX OF   Multiple sclerosis (HCC)   Cluster headache   DVT (deep venous thrombosis) (HCC)   Depression   GERD (gastroesophageal reflux disease)   HTN (hypertension)   Hypokalemia   HCAP (healthcare-associated pneumonia): Patient does not have far as. Her symptoms, but he has oxygen saturation,  leukocytosis. Chest x-ray showed bilateral lower lobe infiltration, indicating possible HCAP. The potential differential diagnosis is pulmonary TB. Lactate is normal. Patient is not septic on admission. Hemodynamically stable.   - Will admit to med-surg (negative air pressure room) - IV Vancomycin and zosyn - Urine legionella and S. pneumococcal antigen - Follow up blood culture x2, sputum culture and respiratory virus panel, plus Flu pcr - AFB - will get Procalcitonin and trend lactic acid level - IVF: 2.5L of NS bolus in ED, followed by 100 mL per hour of NS  - CT-chest for further evaluation. - Isoniazide - please call ID for recommendations  Hx of DVT and PE; on coumdin, INR 1.95 -continue coumadin  Multiple sclerosis (HCC): receiving monthly Natalizumab injection. Last dose was on 01/29/16 -f/u with neurology -on Dalfampridine, baclofen, Neurontin  GERD: -Protonix  Hypokalemia: K= 3.0 on admission. - Repleted - Check Mg level  Depression and anxiety:  -Continue home medications: Zoloft  HTN: -Hold Maxzide since pt is at risk of developing sepsis -Continue verapamil -change Prinzide to lisinopril -IV hydralazine when necessary  Cluster headache: Patient's headaches is most likely due to cluster headache. He has intermittent headache. Per patient, this is typical cluster headache for him. No photophobia, less likely meningitis. CT head is negative. -When necessary ketorolac  DVT ppx: On Coumadin  Code Status: Full code Family Communication: None at bed  side. Disposition Plan: Admit to inpatient   Date of Service 02/16/2016    Lorretta Harp [facility] Pager [phone]  If 7PM-7AM, please contact night-coverage [email or web address] Password [facility] 02/16/2016, 6:55 AM

## 2016-02-16 NOTE — Progress Notes (Signed)
ANTICOAGULATION CONSULT NOTE - Initial Consult  Pharmacy Consult for Warfarin Indication: DVT  No Known Allergies  Patient Measurements:   Vital Signs: Temp: 97.8 F (36.6 C) (04/07 2251) Temp Source: Oral (04/07 2251) BP: 106/68 mmHg (04/08 0214) Pulse Rate: 57 (04/08 0214)  Labs:  Recent Labs  02/16/16 0008 02/16/16 0037  HGB 12.0* 12.6*  HCT 36.7* 37.0*  PLT 236  --   LABPROT 21.5*  --   INR 1.95*  --   CREATININE  --  0.80    CrCl cannot be calculated (Unknown ideal weight.).   Medical History: Past Medical History  Diagnosis Date  . Gallstones 02/17/2009  . Multiple sclerosis (HCC)   . Chronic pain syndrome 01/25/2008  . Insomnia 11/06/2008  . Anxiety   . Other specified visual disturbances   . Nonspecific elevation of levels of transaminase or lactic acid dehydrogenase (LDH)   . Trigeminal neuralgia   . Abdominal pain, unspecified site   . Benign paroxysmal positional vertigo   . Other syndromes affecting cervical region   . Memory loss   . Gait abnormality   . DVT (deep venous thrombosis) (HCC)     in the left arm  . Arthritis   . Pneumonia 2009  . Headache(784.0)     cluster headaches frequently-takes Topamax daily  . Joint pain   . Joint swelling   . Chronic back pain   . History of colonoscopy   . Depression     takes Zoloft daily  . GERD (gastroesophageal reflux disease)     takes Omeprazole as needed  . HTN (hypertension)     takes Lisinopril,Verapamil,and Triamterene HCTZ daily    Medications:  Scheduled:  . Warfarin - Pharmacist Dosing Inpatient   Does not apply q1800   Infusions:  . piperacillin-tazobactam    . vancomycin      Assessment:  55 yr male with h/o MS, TB, cluster headaches presents with c/o headaches.  Noted patient not compliant with TB meds due to side effects.  Patient on Warfarin  daily for h/o DVT (last reported dose was on 4/7 @ 4pm)  INR upon admission = 1.95 (slightly subtherapeutic)   Goal of  Therapy:  INR 2-3   Plan:  Check AM INR - if INR remains < 2 will give Warfarin dose for 4/8 in the AM  Doretha Goding, Joselyn Glassman, PharmD 02/16/2016,2:32 AM

## 2016-02-16 NOTE — Consult Note (Signed)
[facility]       Reason for Consult: ? Tb    Referring Physician: Dr. Gwenlyn Perking  Principal Problem:   HCAP (healthcare-associated pneumonia) Active Problems:   PULMONARY EMBOLISM, HX OF   Multiple sclerosis (HCC)   Cluster headache   DVT (deep venous thrombosis) (HCC)   Depression   GERD (gastroesophageal reflux disease)   HTN (hypertension)   Hypokalemia   . dalfampridine  10 mg Oral BID  . gabapentin  600 mg Oral QID  . levETIRAcetam  500 mg Oral BID  . lisinopril  10 mg Oral Daily  . nicotine  21 mg Transdermal Daily  . Oxcarbazepine  300 mg Oral TID  . pantoprazole  40 mg Oral Daily  . sertraline  50 mg Oral Daily  . verapamil  80 mg Oral BID  . warfarin  5 mg Oral ONCE-1800  . Warfarin - Pharmacist Dosing Inpatient   Does not apply q1800    Recommendations: Stop isolation Stop AFBs Stop vancomycin and zosyn  cefixime for 5 days We will arrange follow up in our office.  Assessment: He came in for a headache he relates to taking his INH and in his work up he had a CXR and CT with an opacity.   Concern brought up for active Tb by ED/hospitalist due to recent treatment for latent Tb. No hemoptysis, no night sweats, some recent weight loss due to poor appetite.  CT findings not at all typical of Tb.   He also has not though been hypoxic, no SOB, some cough that is chronic.  No concern for HCAP, last hospitalization was in August 2016.  No history of multidrug resistant organism.  WBC mildly elevated.  May have viral syndrome vs pneumonia, pnuemonitis.   Latent Tb - doesn't tolerate INH and only other option would be rifampin which interacts with Warfarin and potential interactions with his other medications so at this time, will be difficult to treat for latent Tb so he can start Rituxan.  We will follow up with him in our office to further address this.    Antibiotics: Vancomycin and zosyn  HPI: [name] is a 55 y.o. male with MS,  on natalizumab, GERD, tobacco abuse and latent Tb recently started on INH who came in with headache he relates to starting INH.  Since he started he has been dizzy and with headaches, weak.  He started this in anticipation of rituxan.  Has not yet started though.  No symptoms for Tb as above.  No concerning symptoms for pneumonia but some prodcutive cough but this has been chronic.  No fever, no chills.  WBC mildly elevated.   CT independently reviewed and noted some bilateral findings.  No cavitary area.    Review of Systems:  Constitutional: negative for fevers, chills, sweats and malaise Gastrointestinal: negative for nausea and diarrhea Integument/breast: negative for rash Hematologic/lymphatic: negative for lymphadenopathy Musculoskeletal: negative for myalgias and arthralgias All other systems reviewed and are negative   Past Medical History  Diagnosis Date  . Gallstones 02/17/2009  . Multiple sclerosis (HCC)   . Chronic pain syndrome 01/25/2008  . Insomnia 11/06/2008  . Anxiety   . Other specified visual disturbances   . Nonspecific elevation of levels of transaminase or lactic acid dehydrogenase (LDH)   . Trigeminal neuralgia   . Abdominal pain, unspecified site   . Benign paroxysmal positional vertigo   . Other syndromes affecting cervical region   . Memory loss   .  Gait abnormality   . DVT (deep venous thrombosis) (HCC)     in the left arm  . Arthritis   . Pneumonia 2009  . Headache(784.0)     cluster headaches frequently-takes Topamax daily  . Joint pain   . Joint swelling   . Chronic back pain   . History of colonoscopy   . Depression     takes Zoloft daily  . GERD (gastroesophageal reflux disease)     takes Omeprazole as needed  . HTN (hypertension)     takes Lisinopril,Verapamil,and Triamterene HCTZ daily  . Cluster headache     Social History  Substance Use Topics  . Smoking status: Current Every Day Smoker -- 1.00 packs/day for 38 years    Types:  Cigarettes  . Smokeless tobacco: Never Used  . Alcohol Use: No    Family History  Problem Relation Age of Onset  . Cancer Father   . Diabetes Mother     No Known Allergies  Physical Exam: Constitutional: in no apparent distress and alert  Filed Vitals:   02/16/16 0914 02/16/16 1103  BP: 101/73 108/80  Pulse: 72 66  Temp: 97.8 F (36.6 C) 98.3 F (36.8 C)  Resp: 14 16   EYES: anicteric ENMT: no thrush Cardiovascular: Cor RRR Respiratory: CTAB; normal respiratory effort GI: Bowel sounds are normal, liver is not enlarged, spleen is not enlarged Musculoskeletal: no pedal edema noted Skin: negatives: no rash Neuro: non focal  Lab Results  Component Value Date   WBC 12.4* 02/16/2016   HGB 12.6* 02/16/2016   HCT 37.0* 02/16/2016   MCV 82.3 02/16/2016   PLT 236 02/16/2016    Lab Results  Component Value Date   CREATININE 0.80 02/16/2016   BUN 11 02/16/2016   NA 144 02/16/2016   K 3.0* 02/16/2016   CL 107 02/16/2016   CO2 17* 02/04/2016    Lab Results  Component Value Date   ALT 27 02/04/2016   AST 25 02/04/2016   ALKPHOS 89 02/04/2016     Microbiology: Recent Results (from the past 240 hour(s))  Blood culture (routine x 2)     Status: None (Preliminary result)   Collection Time: 02/16/16  1:55 AM  Result Value Ref Range Status   Specimen Description   Final    BLOOD RIGHT PORTA CATH Performed at [facility]    Special Requests BOTTLES DRAWN AEROBIC AND ANAEROBIC 5CC  Final   Culture PENDING  Incomplete   Report Status PENDING  Incomplete    Staci Righter, MD [facility] [email or web address] [phone] pager  [phone] cell 02/16/2016, 3:43 PM

## 2016-02-16 NOTE — ED Notes (Signed)
Bed: YQ82 Expected date:  Expected time:  Means of arrival:  Comments: Hold for Rm 4

## 2016-02-16 NOTE — ED Notes (Signed)
Dr. Gwenlyn Perking informed pt is refusing TB medication.

## 2016-02-16 NOTE — ED Notes (Signed)
Notified EDP,Palumbo,MD., pt. i-stat Chem 8 results potassium 3.0 and RN,Rick.

## 2016-02-16 NOTE — Progress Notes (Signed)
Patient seen and examined. Admitted after midnight secondary to HA's, generalized weakness and inability to tolerate tx for latent TB. No nausea, no vomiting, no night sweats and currently afebrile. Found to have mild elevation of WBC's and also bilateral lower lobe infiltration on CXR. Patient hemodynamically stable. Please referred to H&P written by Dr. Clyde Lundborg for further info/details on admission.  Plan: -patient is stable and without symptoms of active TB currently; in fact he is refusing INH -doubt needs for isolation -will consult ID for rec's -continue current antibiotics until further assistance/rec's from ID -continue supportive care and flutter valve  -will follow clinical course  [name] [phone]

## 2016-02-16 NOTE — Progress Notes (Signed)
Pharmacy Antibiotic Note  [name] is a 55 y.o. male admitted on 02/15/2016 with pneumonia.  Pharmacy has been consulted for Vancomycin & Zosyn dosing.  Plan: Vancomycin 1gm IV q8h Zosyn 3.375gm IV q8h (each dose infused over 4 hrs)  Weight: 190 lb 0.6 oz (86.2 kg)  Temp (24hrs), Avg:97.8 F (36.6 C), Min:97.8 F (36.6 C), Max:97.8 F (36.6 C)   Recent Labs Lab 02/16/16 0008 02/16/16 0037  WBC 12.4*  --   CREATININE  --  0.80    Estimated Creatinine Clearance: 111.1 mL/min (by C-G formula based on Cr of 0.8).    No Known Allergies  Antimicrobials this admission: 4/8 Vanc >>   4/8 Zosyn >>    Dose adjustments this admission:    Microbiology results: 4/8 BCx:   4/8 Sputum:    4/8 Resp Virus Panel:    Thank you for allowing pharmacy to be a part of this patient's care.  Maryellen Pile, PharmD 02/16/2016 4:46 AM

## 2016-02-16 NOTE — ED Notes (Signed)
Pt transported to CT at present time; will reevaluate pt with pt return from procedure.

## 2016-02-17 DIAGNOSIS — G44029 Chronic cluster headache, not intractable: Secondary | ICD-10-CM | POA: Insufficient documentation

## 2016-02-17 DIAGNOSIS — J189 Pneumonia, unspecified organism: Secondary | ICD-10-CM | POA: Insufficient documentation

## 2016-02-17 DIAGNOSIS — Z227 Latent tuberculosis: Secondary | ICD-10-CM | POA: Insufficient documentation

## 2016-02-17 DIAGNOSIS — K219 Gastro-esophageal reflux disease without esophagitis: Secondary | ICD-10-CM

## 2016-02-17 DIAGNOSIS — I10 Essential (primary) hypertension: Secondary | ICD-10-CM | POA: Diagnosis not present

## 2016-02-17 DIAGNOSIS — G35 Multiple sclerosis: Secondary | ICD-10-CM

## 2016-02-17 DIAGNOSIS — F329 Major depressive disorder, single episode, unspecified: Secondary | ICD-10-CM

## 2016-02-17 DIAGNOSIS — Z86718 Personal history of other venous thrombosis and embolism: Secondary | ICD-10-CM

## 2016-02-17 LAB — PROTIME-INR
INR: 2.07 — AB (ref 0.00–1.49)
Prothrombin Time: 22.5 seconds — ABNORMAL HIGH (ref 11.6–15.2)

## 2016-02-17 MED ORDER — VERAPAMIL HCL 80 MG PO TABS: 40.0000 mg | ORAL_TABLET | Freq: Two times a day (BID) | ORAL | Status: DC

## 2016-02-17 MED ORDER — CEFIXIME 400 MG PO CAPS: 400.0000 mg | ORAL_CAPSULE | Freq: Every day | ORAL | Status: DC

## 2016-02-17 NOTE — Discharge Summary (Signed)
Physician Discharge Summary  [name] ZOX:[id number] DOB: Dec 18, 1960 DOA: 02/15/2016  PCP: Dorrene German, MD  Admit date: 02/15/2016 Discharge date: 02/17/2016  Time spent: 35 minutes  Recommendations for Outpatient Follow-up:  1. Repeat CBC to follow WBC's trend 2. Repeat BMET to follow electrolytes and renal function 3. Please reassess BP and adjust medications as needed    Discharge Diagnoses:  Principal Problem:   HCAP (healthcare-associated pneumonia) Active Problems:   PULMONARY EMBOLISM, HX OF   Multiple sclerosis (HCC)   Cluster headache   DVT (deep venous thrombosis) (HCC)   Depression   GERD (gastroesophageal reflux disease)   HTN (hypertension)   Hypokalemia   CAP (community acquired pneumonia)   Discharge Condition: stable and improved. Discharge home with instructions to complete PO antibotics, to follow with PCP in 2 weeks and to follow with ID services in 7-10 days.  Diet recommendation: heart healthy diet   Filed Weights   02/16/16 0429 02/16/16 1730  Weight: 86.2 kg (190 lb 0.6 oz) 87.544 kg (193 lb)    History of present illness:  As per H&P written by Dr. Clyde Lundborg on 02/16/16 55 y.o. male with PMH of multiple sclerosis on monthly Natalizumab, cluster headache, hypertension, GERD, depression, tobacco abuse, DVT and PE on Coumadin, who presents with HA.   Pt was recently treated with isoniazide for latent TB, but stopped taking it because he felt that the medication made his MS worse. She has been having intermittent headache in the past 10 days,which he attributes to cluster headache. His headache is located in the right frontal area, intermittent, nonradiating. He has chronic neck pain due to MS, which has not changed. No photophobia. He has generalized weakness, and had multiple falls without head injury. Patient does not have cough, fever, chills, chest pain. He was found to have oxygen saturation to 91% in the emergency room. No nausea, vomiting, diarrhea,  abdominal pain.  In ED, patient was found to have WBC 12.4, lactate 1.0, temperature normal, slightly bradycardia, potassium 3.0, creatinine normal, INR 1.5, CT head is negative for acute intracranial abnormalities. Chest x-ray showed bilateral lower lobe infiltration. Patient is admitted to inpatient for further evaluation treatment. Negative air pressure room is requested.  Hospital Course:  CAP/bronchitis: mild hypoxia appreciated on admission, elevated WBC's and bibasilar infiltrates on CXR. -per ID ok to treat with cefuroxime  -will hold INH; see below -advise to keep himself well hydrated and to use flutter valve  Hx of DVT and PE; on coumdin, INR 2.07 -continue coumadin  Multiple sclerosis (HCC): receiving monthly Natalizumab injection. Last dose was on 01/29/16 -continue f/u with neurology -on Dalfampridine, baclofen and Neurontin -plan si for future initiation of Rituxan supposedly   GERD: -continue Protonix  Hypokalemia:  - Repleted  Depression and anxiety:  -stable -continue home medication regimen  -patient without SI or hallucinations   HTN: -will resume home medication regimen  -advise to follow low sodium diet   Cluster headache: Patient's headaches is most likely due to cluster headache. He has intermittent issues with them.  -continue follow up with his neurologist and continue PRN pain meds  Latent TB -stop INH due to side effects/intolerance sx's -please follow up with ID service for further recommendations and treatment   Tobacco abuse -cessation counseling provided  Procedures:  See below for x-ray reports   Consultations:  ID  Discharge Exam: Filed Vitals:   02/16/16 2126 02/17/16 0557  BP: 104/63 97/59  Pulse: 71 80  Temp: 97.8 F (36.6  C) 97.9 F (36.6 C)  Resp: 18 18    General: afebrile, no CP, reports no further HA and no CP. Patient with good air movement and O2 sat on RA. Cardiovascular: port cath in right chest, no rubs, no  gallops; S1 and S2. Respiratory: scattered rhonchi, no wheezing, no crackles; good air movement Abd: soft, NT, ND, positive BS Neuro: no new focal neurologic deficit appreciated. Chronic LE weakness from MS; but able to move four limbs.  Discharge Instructions   Discharge Instructions    Discharge instructions    Complete by:  As directed   Follow a heart healthy diet  Take medications as prescribed  Stop isoniazide   Follow up with infectious disease service in 7-10 days (please contact office for appointment details) Stop smoking Use flutter valve as instructed Keep yourself well hydrated and take antibiotics with food.          Current Discharge Medication List    START taking these medications   Details  Cefixime (SUPRAX) 400 MG CAPS capsule Take 1 capsule (400 mg total) by mouth daily. Qty: 6 capsule, Refills: 0      CONTINUE these medications which have CHANGED   Details  verapamil (CALAN) 80 MG tablet Take 0.5 tablets (40 mg total) by mouth 2 (two) times daily.      CONTINUE these medications which have NOT CHANGED   Details  baclofen (LIORESAL) 20 MG tablet Take 20 mg by mouth 4 (four) times daily as needed for muscle spasms.     dalfampridine (AMPYRA) 10 MG TB12 Take 10 mg by mouth 2 (two) times daily.    Eszopiclone (ESZOPICLONE) 3 MG TABS Take 3 mg by mouth at bedtime as needed (for sleep).     gabapentin (NEURONTIN) 600 MG tablet Take 600 mg by mouth 4 (four) times daily.     levETIRAcetam (KEPPRA) 500 MG tablet Take 500 mg by mouth 2 (two) times daily. Refills: 5    lisinopril-hydrochlorothiazide (PRINZIDE,ZESTORETIC) 10-12.5 MG per tablet Take 1 tablet by mouth daily.    Oxcarbazepine (TRILEPTAL) 300 MG tablet Take 300 mg by mouth 3 (three) times daily. Refills: 5    oxyCODONE-acetaminophen (PERCOCET) 10-325 MG tablet Take 1-2 tablets by mouth every 6 (six) hours as needed for pain.     sertraline (ZOLOFT) 50 MG tablet Take 50 mg by mouth daily.      warfarin (COUMADIN) 5 MG tablet Take 5 mg by mouth every evening.     pantoprazole (PROTONIX) 40 MG tablet Take 1 tablet (40 mg total) by mouth daily. Qty: 20 tablet, Refills: 0      STOP taking these medications     isoniazid (NYDRAZID) 300 MG tablet      omeprazole (PRILOSEC) 20 MG capsule      pyridOXINE ([employer] VITAMIN B-6) 50 MG tablet      triamterene-hydrochlorothiazide (MAXZIDE-25) 37.5-25 MG per tablet      oxyCODONE-acetaminophen (PERCOCET/ROXICET) 5-325 MG tablet        No Known Allergies Follow-up Information    Follow up with Dorrene German, MD. Schedule an appointment as soon as possible for a visit in 2 weeks.   Specialty:  Internal Medicine   Contact information:   [address] [place] [address] [phone]       Call Judyann Munson, MD.   Specialty:  Infectious Diseases   Why:  office for appointment details   Contact information:   [address] [place] [address] [phone]  The results of significant diagnostics from this hospitalization (including imaging, microbiology, ancillary and laboratory) are listed below for reference.    Significant Diagnostic Studies: Dg Chest 2 View  02/16/2016  CLINICAL DATA:  Left-sided chest pain and worsening dyspnea for 2 weeks. EXAM: CHEST  2 VIEW COMPARISON:  02/04/2016 FINDINGS: There is a right jugular power port with tip in the low SVC. There are basilar opacities bilaterally, new. This may represent pneumonia, particularly in the posterior lower lobes were there is confluent consolidation. No effusion. Normal pulmonary vasculature. IMPRESSION: Lower lobe consolidation bilaterally, suspicious for pneumonia. Followup PA and lateral chest X-ray is recommended in 3-4 weeks following trial of antibiotic therapy to ensure resolution and exclude underlying malignancy. Electronically Signed   By: Ellery Plunk M.D.   On: 02/16/2016 00:38   Dg Chest 2 View  02/04/2016  CLINICAL  DATA:  Positive PPD. EXAM: CHEST  2 VIEW COMPARISON:  07/06/2015. FINDINGS: PowerPort catheter in stable position. Mediastinum hilar structures normal. Lungs are clear. Heart size normal. Mild bibasilar subsegmental atelectasis. No pleural effusion or pneumothorax . IMPRESSION: 1. Power port catheter in good anatomic position. 2.  Low lung volumes with mild bibasilar subsegmental atelectasis. Electronically Signed   By: Maisie Fus  Register   On: 02/04/2016 11:44   Ct Head Wo Contrast  02/16/2016  CLINICAL DATA:  Change in headache pattern. EXAM: CT HEAD WITHOUT CONTRAST TECHNIQUE: Contiguous axial images were obtained from the base of the skull through the vertex without intravenous contrast. COMPARISON:  08/23/2015 FINDINGS: There is no intracranial hemorrhage, mass or evidence of acute infarction. There is no extra-axial fluid collection. Gray matter and white matter appear normal. Cerebral volume is normal for age. Brainstem and posterior fossa are unremarkable. The CSF spaces appear normal. The bony structures are intact. The visible portions of the paranasal sinuses are clear. IMPRESSION: Normal brain Electronically Signed   By: Ellery Plunk M.D.   On: 02/16/2016 01:40   Ct Chest W Contrast  02/16/2016  CLINICAL DATA:  55 year old male with a history of tuberculosis undergoing treatment. Hypoxia. EXAM: CT CHEST WITH CONTRAST TECHNIQUE: Multidetector CT imaging of the chest was performed during intravenous contrast administration. CONTRAST:  80 cc Isovue 370 COMPARISON:  Chest x-ray 02/15/2016, 02/04/2016 chest CT 02/25/2009 FINDINGS: Chest: Port catheter right chest wall. Multiple bilateral axillary lymph nodes Unremarkable appearance of the thoracic inlet, including the visualized thyroid. Mediastinal lymph nodes are present, none of which are enlarged. Bilateral hilar lymph nodes. Unremarkable appearance of the esophagus. Although the study is contrast-enhanced, there is limited bolus concentration  within the central vascular structures, limiting evaluation. No aneurysm of the thoracic aorta. No periaortic fluid. No significant atherosclerotic changes of the aorta or of the branch vessels. Common origin of the innominate artery and left common carotid artery. Evaluation of the pulmonary arterial tree is limited. Heart size within normal limits. No pericardial fluid/thickening. No significant calcifications of the native coronary vasculature. Paraseptal emphysema at the bilateral lung apices. Nodular and linear opacities in the dependent lung bases involving predominantly the right and left lower lobes. No endotracheal debris. No significant bronchiectasis. Wedge-shaped opacity in the lingula in a similar distribution to comparison chest CT of 02/25/2009. No pleural effusion. Upper abdomen: Surgical changes of cholecystectomy. Musculoskeletal: No displaced fracture. IMPRESSION: Opacities in dependent lung bases, concerning for pneumonia/pneumonitis given the history. The distribution is not typical of tuberculous infection, however, atypical pathogens should be included on the differential. Multiple borderline enlarged lymph nodes of the axillary nodal stations.  This may reflect local reactive changes, or potentially hematologic process. Correlation with physical exam and lab values may be useful. Signed, Yvone Neu. Loreta Ave, DO [facility] Electronically Signed   By: Gilmer Mor D.O.   On: 02/16/2016 08:13    Microbiology: Recent Results (from the past 240 hour(s))  Blood culture (routine x 2)     Status: None (Preliminary result)   Collection Time: 02/16/16  1:55 AM  Result Value Ref Range Status   Specimen Description   Final    BLOOD RIGHT PORTA CATH Performed at [facility]    Special Requests BOTTLES DRAWN AEROBIC AND ANAEROBIC 5CC  Final   Culture PENDING  Incomplete   Report Status PENDING  Incomplete     Labs: Basic  Metabolic Panel:  Recent Labs Lab 02/16/16 0037 02/16/16 0443  NA 144  --   K 3.0*  --   CL 107  --   GLUCOSE 97  --   BUN 11  --   CREATININE 0.80  --   MG  --  1.9   CBC:  Recent Labs Lab 02/16/16 0008 02/16/16 0037  WBC 12.4*  --   NEUTROABS 5.8  --   HGB 12.0* 12.6*  HCT 36.7* 37.0*  MCV 82.3  --   PLT 236  --    BNP (last 3 results)  Recent Labs  05/28/15 2140  BNP 8.0    Signed:  Vassie Loll MD.  [facility] 02/17/2016, 10:16 AM

## 2016-02-17 NOTE — Care Management Note (Signed)
Case Management Note  Patient Details  Name: [name] MRN: [id number] Date of Birth: [birth year]  Subjective/Objective:    HCAP                Action/Plan: Discharge Planning: AVS reviewed: NCM spoke to pt and offered choice for HH/provided list. Pt agreeable to [facility] for HH. Contacted [facility] liaison with new referral for [facility]. Pt states he lives at home with brother. He has an older RW at home. Requesting RW with seat. Contacted [facility] DME rep for delivery for RW with seat to home. [facility] transportation arranged.   Expected Discharge Date:  02/17/2016               Expected Discharge Plan:  Home w Home Health Services  In-House Referral:  NA  Discharge planning Services  CM Consult  Post Acute Care Choice:  Home Health Choice offered to:  Patient  DME Arranged:  Walker rolling with seat DME Agency:  [facility]  HH Arranged:  RN, PT, Nurse's Aide HH Agency:  [facility]  Status of Service:  Completed, signed off  Medicare Important Message Given:    Date Medicare IM Given:    Medicare IM give by:    Date Additional Medicare IM Given:    Additional Medicare Important Message give by:     If discussed at Long Length of Stay Meetings, dates discussed:    Additional Comments:  Elliot Cousin, RN 02/17/2016, 2:46 PM

## 2016-02-17 NOTE — Evaluation (Signed)
Physical Therapy Evaluation Patient Details Name: [name] MRN: [id number] DOB: June 07, 1961 Today's Date: 02/17/2016   History of Present Illness  [name] is a 55 y.o. male with PMH of multiple sclerosis on monthly Natalizumab, cluster headache, hypertension, GERD, depression, tobacco abuse, DVT and PE on Coumadin, who presents with HA. Was being treated for latent TB and reports the medication caused him to fall several times. CT indicates pneumonia  Clinical Impression  The patient expresses concern for discharging today and feels he is not ready. Initially patient did not want to get up, then consented and stated that  he was weaker than  He expected.  Patient has an  aide, has a help line, a WC and RW. Patient only  took a few steps with RW  before c/o dizziness and backed up to bed. BP was WFL.The patient reports several falls PTA.  recommend HHPT.    Follow Up Recommendations Home health PT;Supervision/Assistance - 24 hour    Equipment Recommendations  None recommended by PT    Recommendations for Other Services       Precautions / Restrictions Precautions Precautions: Fall      Mobility  Bed Mobility Overal bed mobility: Modified Independent                Transfers Overall transfer level: Needs assistance Equipment used: Rolling walker (2 wheeled) Transfers: Sit to/from Stand Sit to Stand: Min guard         General transfer comment: slow to rise  Ambulation/Gait Ambulation/Gait assistance: Min assist Ambulation Distance (Feet): 5 Feet Assistive device: Rolling walker (2 wheeled) Gait Pattern/deviations: Step-to pattern;Decreased stance time - right;Shuffle;Wide base of support;Trunk flexed     General Gait Details: took 3 steps, trunk flexed, c/o dizziness and backed up to bed. BO 137/71  Stairs            Wheelchair Mobility    Modified Rankin (Stroke Patients Only)       Balance Overall balance assessment: History of  Falls;Needs assistance Sitting-balance support: Feet supported;Bilateral upper extremity supported Sitting balance-Leahy Scale: Fair     Standing balance support: During functional activity;Bilateral upper extremity supported Standing balance-Leahy Scale: Poor                               Pertinent Vitals/Pain Pain Assessment: No/denies pain    Home Living Family/patient expects to be discharged to:: Private residence Living Arrangements: Other relatives Available Help at Discharge: Family Type of Home: House Home Access: Ramped entrance     Home Layout: One level Home Equipment: Environmental consultant - 2 wheels;Wheelchair - manual      Prior Function Level of Independence: Needs assistance      ADL's / Homemaking Assistance Needed: has aid 7/days a week        Hand Dominance        Extremity/Trunk Assessment   Upper Extremity Assessment: Overall WFL for tasks assessed           Lower Extremity Assessment: LLE deficits/detail;RLE deficits/detail RLE Deficits / Details: decreased control, legs are stiff,  LLE Deficits / Details: same     Communication      Cognition Arousal/Alertness: Awake/alert Behavior During Therapy: WFL for tasks assessed/performed Overall Cognitive Status: Within Functional Limits for tasks assessed                      General Comments      Exercises  Assessment/Plan    PT Assessment Patient needs continued PT services  PT Diagnosis Generalized weakness;Abnormality of gait   PT Problem List Decreased strength;Decreased activity tolerance;Decreased balance;Decreased mobility;Decreased knowledge of precautions;Decreased safety awareness;Decreased knowledge of use of DME  PT Treatment Interventions DME instruction;Functional mobility training;Therapeutic activities;Therapeutic exercise;Patient/family education   PT Goals (Current goals can be found in the Care Plan section) Acute Rehab PT Goals Patient Stated  Goal: wants to stay another day PT Goal Formulation: With patient Time For Goal Achievement: 03/02/16 Potential to Achieve Goals: Good    Frequency Min 3X/week   Barriers to discharge Decreased caregiver support      Co-evaluation               End of Session Equipment Utilized During Treatment: Gait belt Activity Tolerance: Patient limited by fatigue Patient left: in bed;with call bell/phone within reach;with bed alarm set Nurse Communication: Mobility status    Functional Assessment Tool Used: clinical judgement Functional Limitation: Mobility: Walking and moving around Mobility: Walking and Moving Around Current Status (J1914): At least 20 percent but less than 40 percent impaired, limited or restricted Mobility: Walking and Moving Around Goal Status [phone]): At least 1 percent but less than 20 percent impaired, limited or restricted    Time: 1019-1031 PT Time Calculation (min) (ACUTE ONLY): 12 min   Charges:   PT Evaluation $PT Eval Low Complexity: 1 Procedure     PT G Codes:   PT G-Codes **NOT FOR INPATIENT CLASS** Functional Assessment Tool Used: clinical judgement Functional Limitation: Mobility: Walking and moving around Mobility: Walking and Moving Around Current Status (A2130): At least 20 percent but less than 40 percent impaired, limited or restricted Mobility: Walking and Moving Around Goal Status [phone]): At least 1 percent but less than 20 percent impaired, limited or restricted    Sharen Heck PT [phone]   02/17/2016, 10:43 AM

## 2016-02-17 NOTE — Plan of Care (Signed)
Problem: Pain Managment: Goal: General experience of comfort will improve Outcome: Progressing Continues to rate headache pain 8/10, percocet increased to q4h prn today.

## 2016-02-17 NOTE — Plan of Care (Signed)
Problem: Safety: Goal: Ability to remain free from injury will improve Outcome: Progressing Bed alarm due to hx of falls at home and weakness BLE; pt sometimes forgets to call before standing to use urinal.

## 2016-02-17 NOTE — Progress Notes (Signed)
Patient discharged to home, all discharge medications and instructions reviewed and questions answered.  Patient to be transported to home via ambulance.

## 2016-02-18 LAB — LEGIONELLA PNEUMOPHILA SEROGP 1 UR AG: L. pneumophila Serogp 1 Ur Ag: NEGATIVE

## 2016-02-18 LAB — RESPIRATORY VIRUS PANEL
Adenovirus: NEGATIVE
INFLUENZA A: NEGATIVE
Influenza B: NEGATIVE
Metapneumovirus: NEGATIVE
PARAINFLUENZA 1 A: NEGATIVE
PARAINFLUENZA 2 A: NEGATIVE
Parainfluenza 3: NEGATIVE
RESPIRATORY SYNCYTIAL VIRUS B: NEGATIVE
Respiratory Syncytial Virus A: NEGATIVE
Rhinovirus: NEGATIVE

## 2016-02-19 ENCOUNTER — Telehealth: Payer: Self-pay | Admitting: *Deleted

## 2016-02-19 NOTE — Telephone Encounter (Signed)
Patient called and advised he wants to know what he should do at this point. He is not taking the Tb medication and the doctor in hospital told him his blood was negative for Tb. He does not want to wait until May to be seen or called about his condition or medication. Patient was very upset and will not restart the medication until he hears from a doctor. Advised him will send the doctor a message and see what they would like for him to do and someone will call him. Also will check and see if he needs to be seen sooner than 03/2016.

## 2016-02-19 NOTE — Telephone Encounter (Signed)
Per message from [place] called the patient 02/15/16 and unable to reach as he did not answer the line. Patient went to the ED and was admitted over the weekend. Noticed that he has spoken with Angelique Blonder and has been scheduled to see Drue Second in May.

## 2016-02-19 NOTE — Telephone Encounter (Signed)
Thanks, we can keep him on for May with Dr. Drue Second.

## 2016-02-19 NOTE — Telephone Encounter (Signed)
Pt has questions about treatment plan for latent TB.  Was hospitalized over the past weekend (4/7-02/17/16) for cluster headaches and elevated WBCs.  Pt stated today that the reason he was having the headaches was the medication for latent TB, INH.  He is unclear/uncertain about what his treatment options are at this time.  He did say that he has spoken with a pharmacist.  He still has questions about what needs to happen next.  He stated that he really needs to talk with a doctor.

## 2016-02-21 LAB — CULTURE, BLOOD (ROUTINE X 2)
Culture: NO GROWTH
Culture: NO GROWTH

## 2016-02-22 ENCOUNTER — Inpatient Hospital Stay: Payer: Medicare Other

## 2016-02-25 NOTE — Telephone Encounter (Signed)
It looks like he was hospitalized from pneumonia. Can you call him with new appt to come back in clinic to decide what other options to try for treatment of latent tb. No need to rush at this time, i would like to have him recover from pna. He should remain off of the INH

## 2016-02-26 ENCOUNTER — Inpatient Hospital Stay: Payer: Medicare HMO | Attending: Family Medicine

## 2016-02-26 IMAGING — MR MR CERVICAL SPINE W/O CM
6 series · 48 of 48 positions shown · non-contrast
Comparison: None.

CLINICAL DATA: Multiple sclerosis, bilateral arm pain, weakness,
numbness, tingling

EXAM:
MRI CERVICAL SPINE WITHOUT CONTRAST
TECHNIQUE: Multiplanar, multisequence MR imaging of the cervical spine was
performed. No intravenous contrast was administered.

[Series 3: T2 · sagittal · 3.0mm · 0.82mm/px · 5 of 12 slices shown (1 of 3)]
[im 1/12]
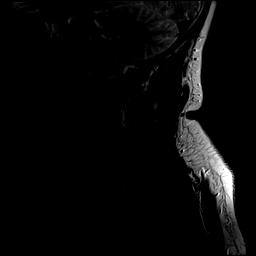
[im 3/12]
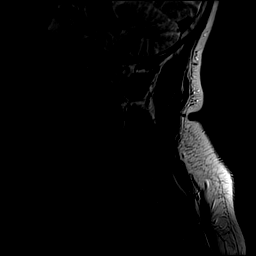
[im 6/12]
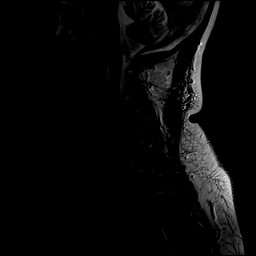
[im 9/12]
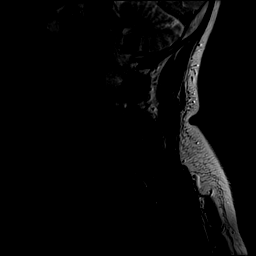
[im 12/12]
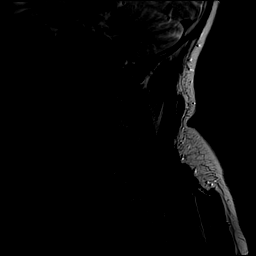

[Series 4: T1 · sagittal · 3.0mm · 0.82mm/px · 5 of 12 slices shown (1 of 2)]
[im 1/12]
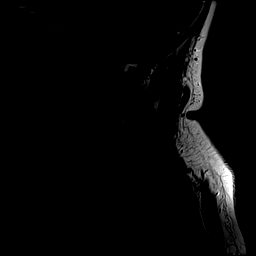
[im 3/12]
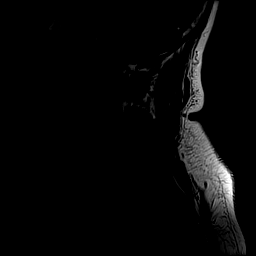
[im 6/12]
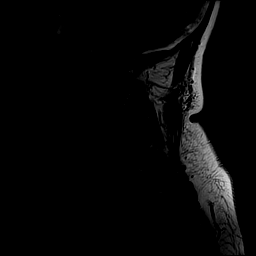
[im 9/12]
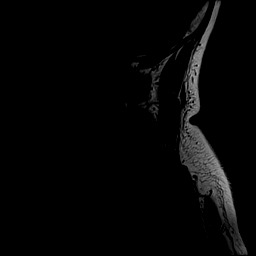
[im 12/12]
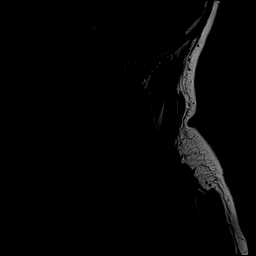

[Series 5: STIR · sagittal · 3.0mm · 0.82mm/px · 5 of 12 slices shown]
[im 1/12]
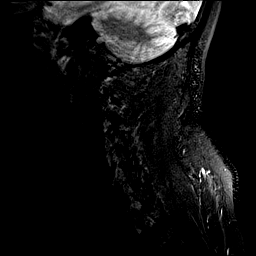
[im 3/12]
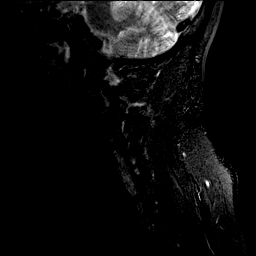
[im 6/12]
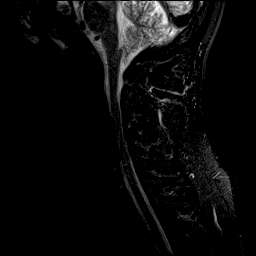
[im 9/12]
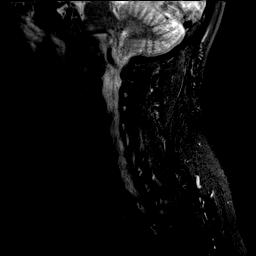
[im 12/12]
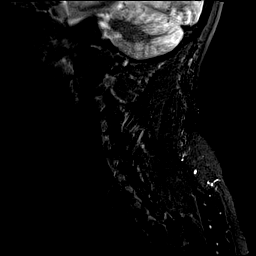

[Series 6: T2 · axial · 3.0mm · 0.74mm/px · z∈[-217,-124]mm · 11 of 26 slices shown (2 of 3)]
[im 1/26]
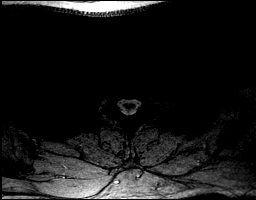
[im 3/26]
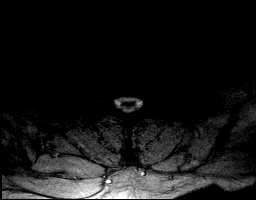
[im 6/26]
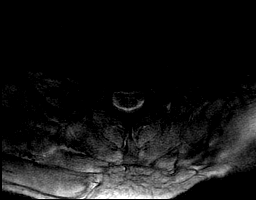
[im 8/26]
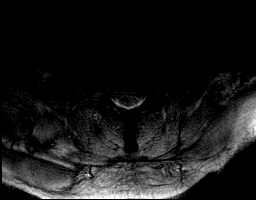
[im 11/26]
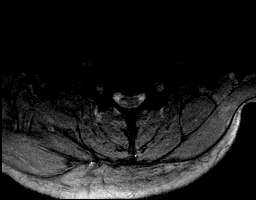
[im 13/26]
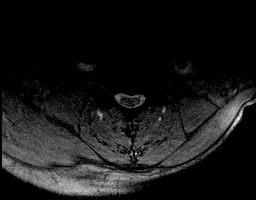
[im 16/26]
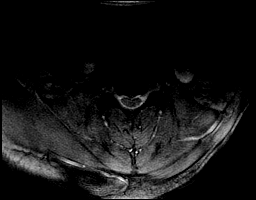
[im 18/26]
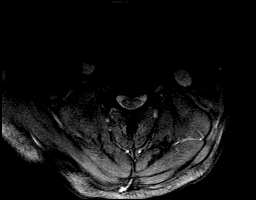
[im 21/26]
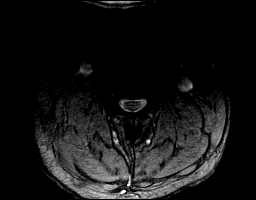
[im 23/26]
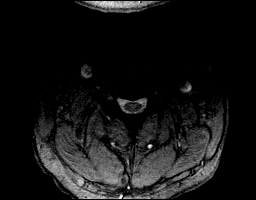
[im 26/26]
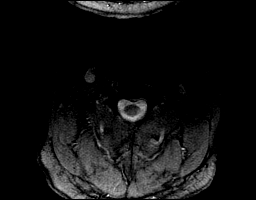

[Series 7: T1 · axial · 3.0mm · 0.78mm/px · z∈[-219,-126]mm · 11 of 26 slices shown (2 of 2)]
[im 1/26]
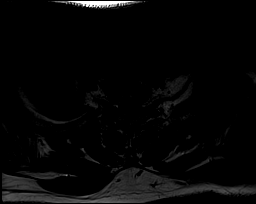
[im 3/26]
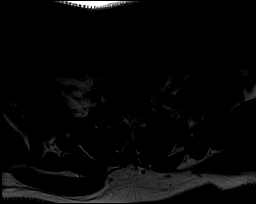
[im 6/26]
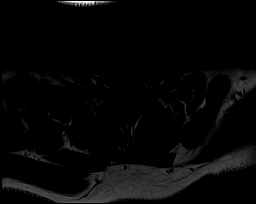
[im 8/26]
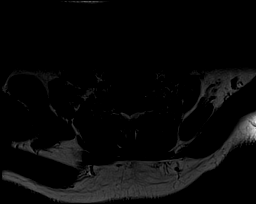
[im 11/26]
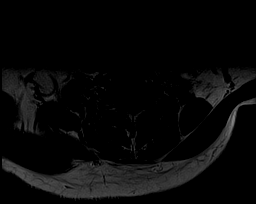
[im 13/26]
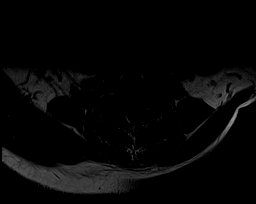
[im 16/26]
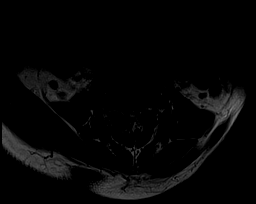
[im 18/26]
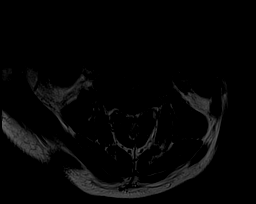
[im 21/26]
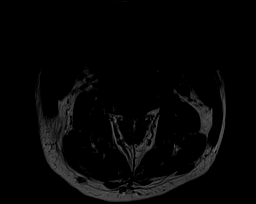
[im 23/26]
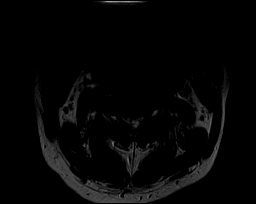
[im 26/26]
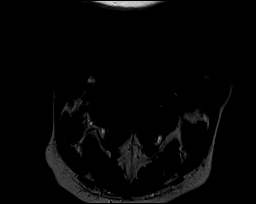

[Series 8: T2 · axial · 3.0mm · 0.74mm/px · z∈[-218,-124]mm · 11 of 26 slices shown (3 of 3)]
[im 1/26]
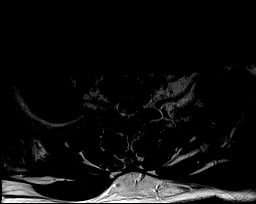
[im 3/26]
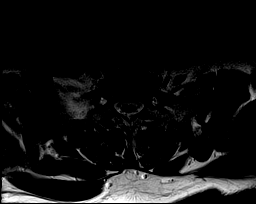
[im 6/26]
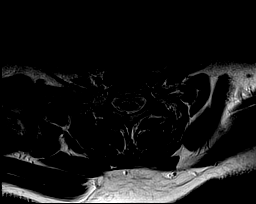
[im 8/26]
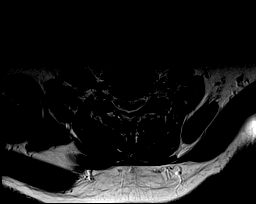
[im 11/26]
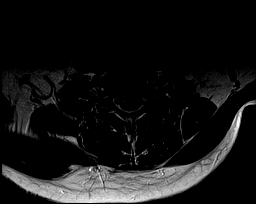
[im 13/26]
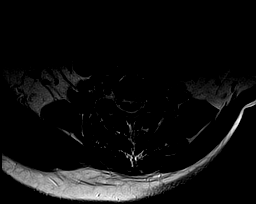
[im 16/26]
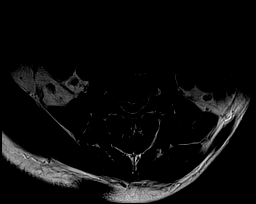
[im 18/26]
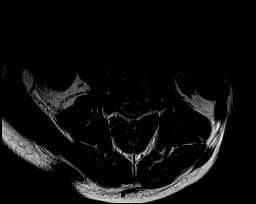
[im 21/26]
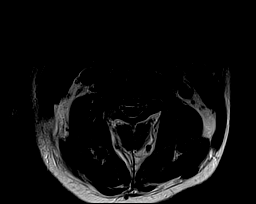
[im 23/26]
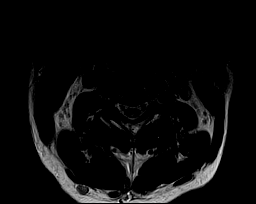
[im 26/26]
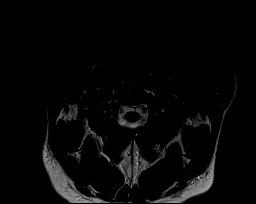

[48 of 48 positions shown; findings below may reference images not displayed]

FINDINGS: There is patient motion throughout the examination degrading image
quality limiting evaluation.

The cervical cord is normal in size and signal. Vertebral body
heights are maintained. The disc spaces are preserved. The cervical
spine is normal in lordotic alignment. No static listhesis. Bone
marrow signal is normal. Cerebellar tonsils are normal in position.

C2-3: Small central disc protrusion. No neural foraminal stenosis.
No central canal stenosis.

C3-4: Mild broad-based disc bulge. Bilateral uncovertebral
degenerative changes resulting in severe bilateral foraminal
stenosis. No central canal stenosis.

C4-5: Small central disc protrusion abutting the ventral cervical
spinal cord. Right uncovertebral degenerative change and mild right
facet arthropathy resulting in moderate right foraminal stenosis. No
left foraminal stenosis. No central canal stenosis.

C5-6: Mild broad-based disc bulge eccentric towards the left with a
left foraminal disc protrusion severely narrowing the left neural
foramen. No right foraminal stenosis. No central canal stenosis.

C6-7: No significant disc bulge. No neural foraminal stenosis. No
central canal stenosis.

C7-T1: No significant disc bulge. No neural foraminal stenosis. No
central canal stenosis.
IMPRESSION: 1. At C3-4, there is a mild broad-based disc bulge with bilateral
uncovertebral degenerative changes resulting in bilateral foraminal
stenosis.
2. At C4-5, there is a small central disc protrusion abutting the
ventral cervical spinal cord. Right uncovertebral degenerative
changes and mild right facet arthropathy resulting in moderate right
foraminal stenosis.
3. At C5-6, there is a mild broad-based disc bulge eccentric towards
the left with a left foraminal disc protrusion severely narrowing
the left neural foramen.
4. Small central disc protrusion at C2-3.

## 2016-02-26 IMAGING — MR MR HEAD W/O CM
8 of 9 series · 36 of 48 positions shown · non-contrast
Comparison: Cervical spine MRI from the same day reported
separately.

CLINICAL DATA: 53-year-old male with multiple sclerosis since 3669.
New bilateral upper extremity pain weakness numbness and tingling.

Despite multiple attempts no suitable intravenous access could be
obtained, and the technologist attempted to contact the referring
office, but it was closed at the time of imaging.
EXAM:
MRI HEAD WITHOUT CONTRAST
TECHNIQUE: Multiplanar, multiecho pulse sequences of the brain and surrounding
structures were obtained without intravenous contrast.

[Series 3: T1 · sagittal · 5.0mm · 0.45mm/px · 1 of 19 slices shown]
[im 1/19]
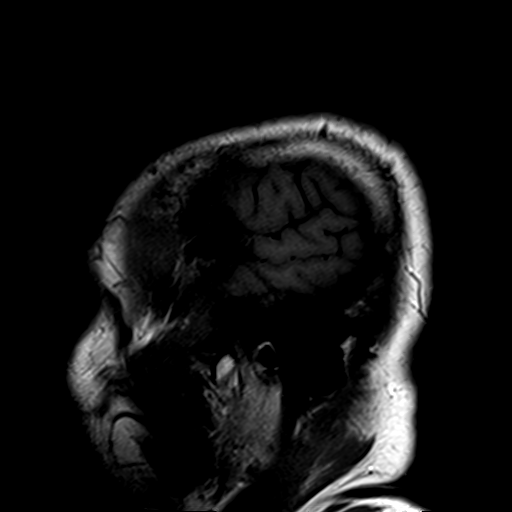

[Series 4: DWI · axial · 3.0mm · 0.90mm/px · z∈[-96,+51]mm · 9 of 100 slices shown]
[im 1/100]
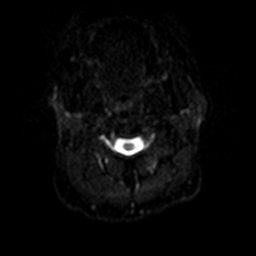
[im 19/100]
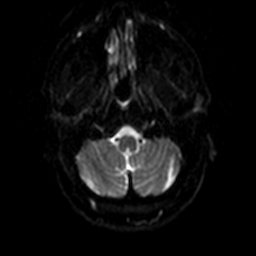
[im 28/100]
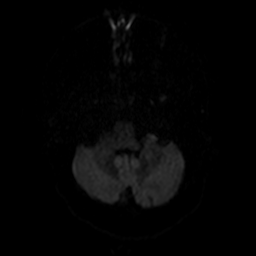
[im 46/100]
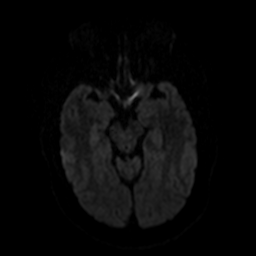
[im 55/100]
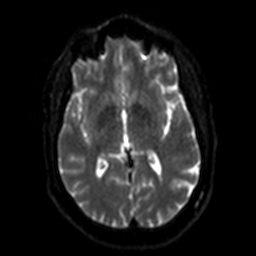
[im 73/100]
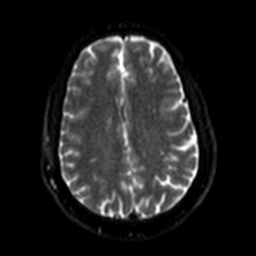
[im 82/100]
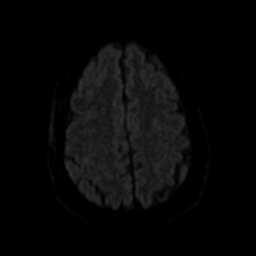
[im 91/100]
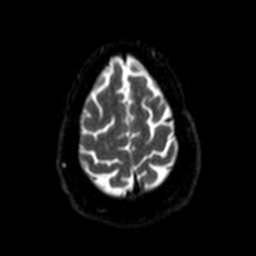
[im 100/100]
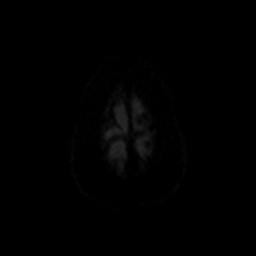

[Series 5: dwi_adc · axial · 3.0mm · 0.90mm/px · z∈[-96,+51]mm · 6 of 49 slices shown]
[im 1/49]
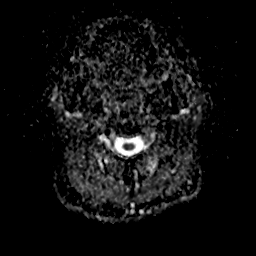
[im 10/49]
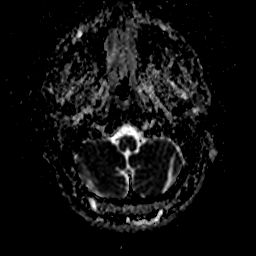
[im 20/49]
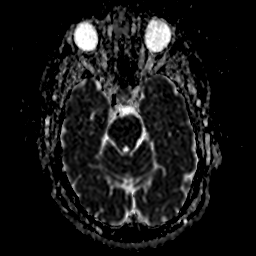
[im 29/49]
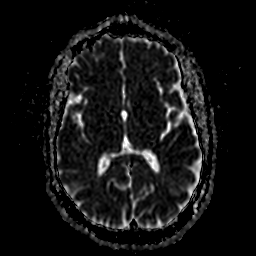
[im 39/49]
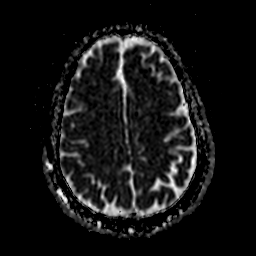
[im 49/49]
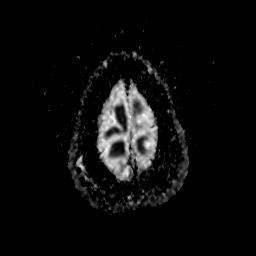

[Series 6: FLAIR · axial · 5.0mm · 0.45mm/px · z∈[-113,+74]mm · 4 of 30 slices shown (1 of 2)]
[im 1/30]
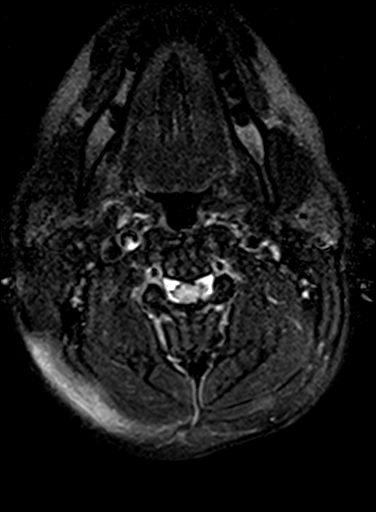
[im 10/30]
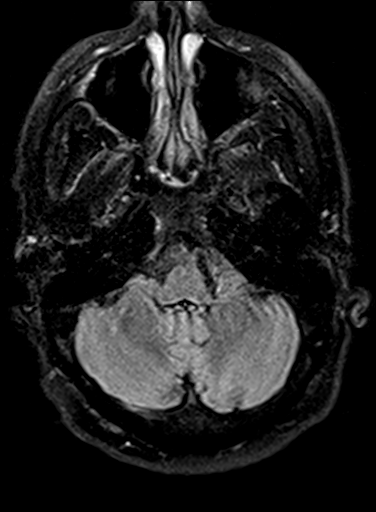
[im 20/30]
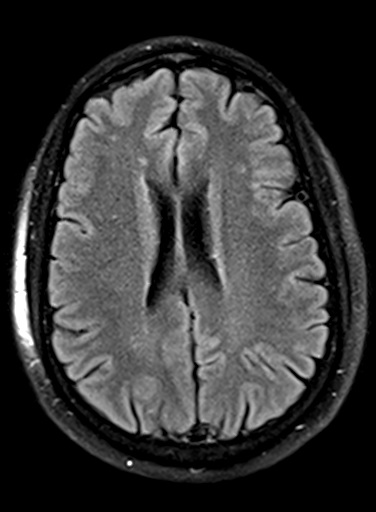
[im 30/30]
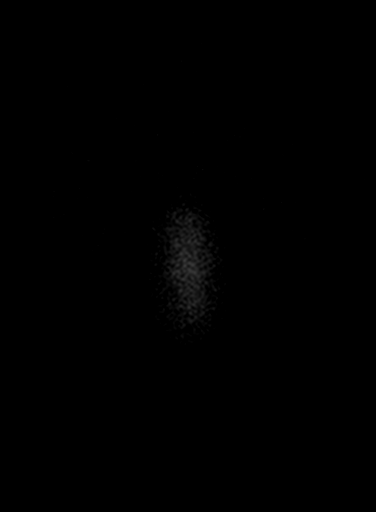

[Series 7: FLAIR · sagittal · 5.0mm · 0.45mm/px · 3 of 22 slices shown (2 of 2)]
[im 1/22]
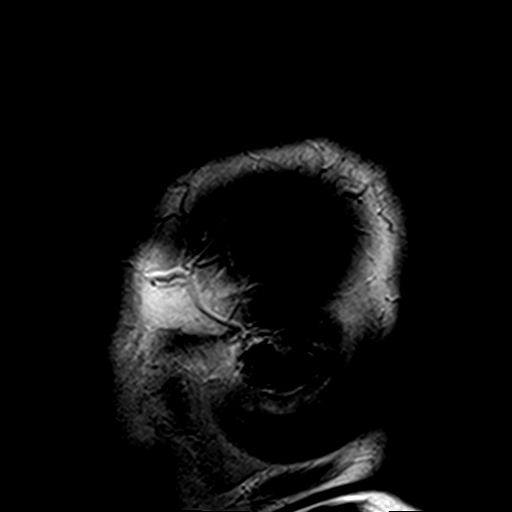
[im 11/22]
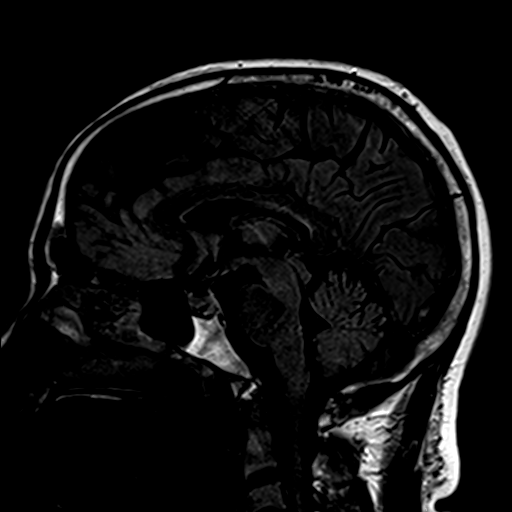
[im 22/22]
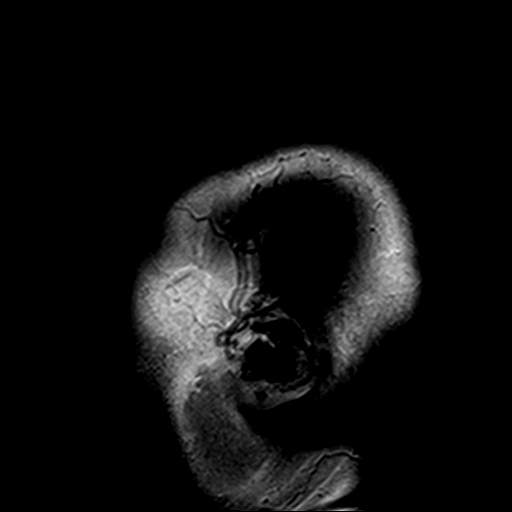

[Series 8: T2 · axial · 5.0mm · 0.45mm/px · z∈[-87,+48]mm · 3 of 22 slices shown (1 of 2)]
[im 1/22]
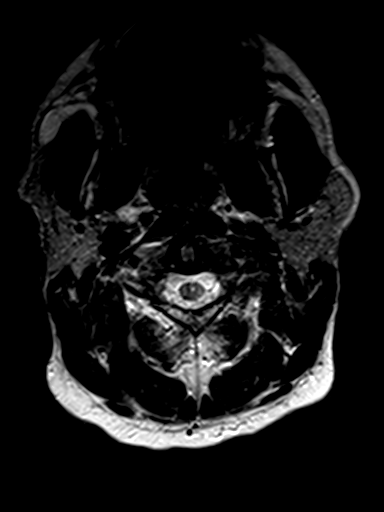
[im 11/22]
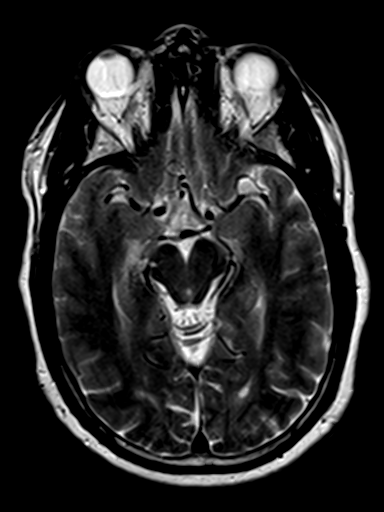
[im 22/22]
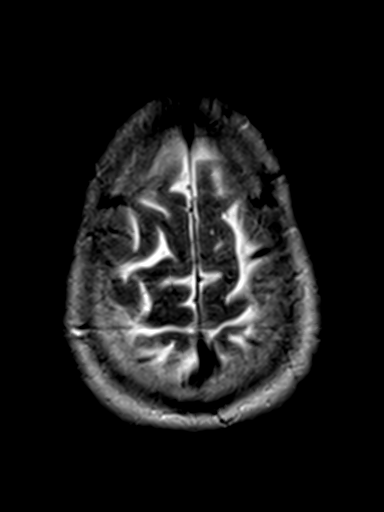

[Series 10: swi_images · axial · 2.2mm · 0.90mm/px · z∈[-87,+45]mm · 7 of 60 slices shown]
[im 1/60]
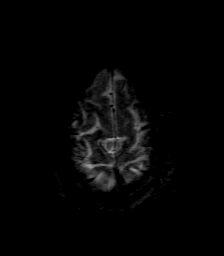
[im 10/60]
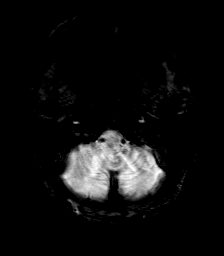
[im 20/60]
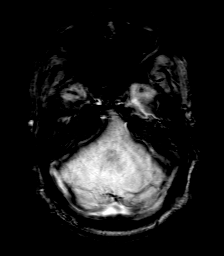
[im 30/60]
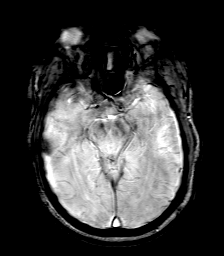
[im 40/60]
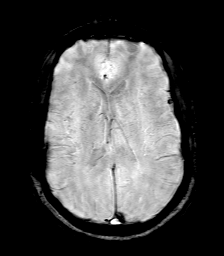
[im 50/60]
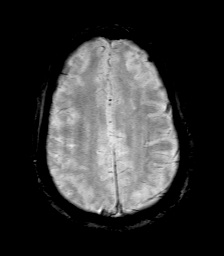
[im 60/60]
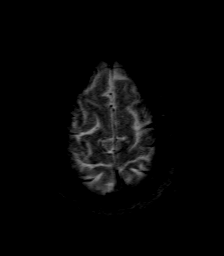

[Series 12: T2 · coronal · 5.0mm · 0.41mm/px · 3 of 24 slices shown (2 of 2)]
[im 1/24]
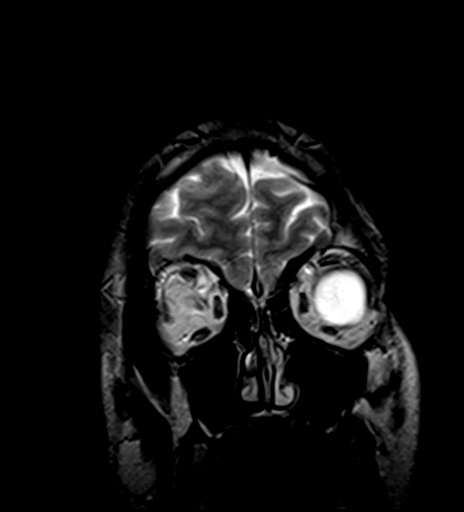
[im 12/24]
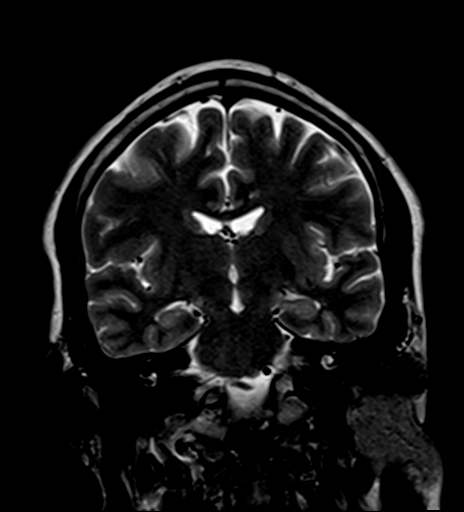
[im 24/24]
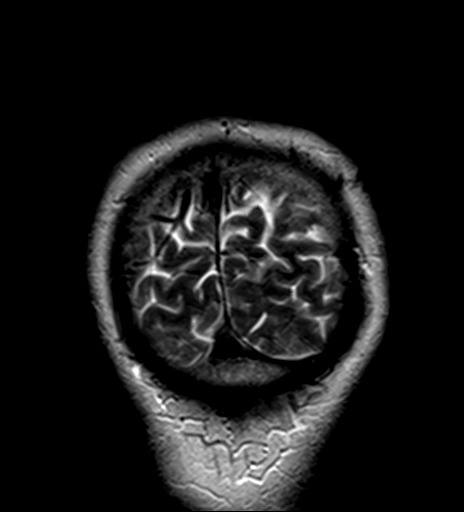

[36 of 48 positions shown; findings below may reference images not displayed]

FINDINGS: Cerebral volume is within normal limits for age. No restricted
diffusion to suggest acute infarction. No midline shift, mass
effect, evidence of mass lesion, ventriculomegaly, extra-axial
collection or acute intracranial hemorrhage. Cervicomedullary
junction and pituitary are within normal limits. Major intracranial
vascular flow voids are preserved.

Small mostly subcortical scattered cerebral white matter foci of T2
and FLAIR hyperintensity. There is temporal lobe involvement on the
left. There are occasional small periventricular lesions. No
definite direct involvement of the corpus callosum. Deep gray matter
nuclei and brainstem are within normal limits. Cerebellum within
normal limits. No cortical encephalomalacia. No abnormal
susceptibility in the brain. No lesions with restricted diffusion
identified.

Visible internal auditory structures appear normal. Visualized
paranasal sinuses and mastoids are clear. Grossly normal orbits soft
tissues. Negative scalp soft tissues. Normal bone marrow signal.
IMPRESSION: 1. Scattered small foci of nonspecific cerebral white matter, but
compatible with stated diagnosis of multiple sclerosis. Intravenous
access could not be obtained, but no acute diffusion changes are
identified to suggest acute demyelinating disease.
2. No other intracranial abnormality identified.

## 2016-03-04 ENCOUNTER — Ambulatory Visit: Payer: Self-pay

## 2016-03-07 ENCOUNTER — Encounter (HOSPITAL_COMMUNITY): Payer: Self-pay | Admitting: *Deleted

## 2016-03-11 ENCOUNTER — Inpatient Hospital Stay: Payer: Medicare HMO | Attending: Internal Medicine

## 2016-03-11 ENCOUNTER — Inpatient Hospital Stay: Payer: Medicare HMO | Admitting: Internal Medicine

## 2016-03-11 DIAGNOSIS — Z79899 Other long term (current) drug therapy: Secondary | ICD-10-CM | POA: Insufficient documentation

## 2016-03-11 DIAGNOSIS — G35 Multiple sclerosis: Secondary | ICD-10-CM | POA: Insufficient documentation

## 2016-03-14 ENCOUNTER — Inpatient Hospital Stay: Payer: Medicare Other

## 2016-03-17 ENCOUNTER — Ambulatory Visit (HOSPITAL_COMMUNITY): Payer: Medicare HMO | Admitting: Certified Registered Nurse Anesthetist

## 2016-03-17 ENCOUNTER — Encounter (HOSPITAL_COMMUNITY)
Admission: RE | Disposition: A | Discharge status: Home / Self Care | Payer: Self-pay | Source: Ambulatory Visit | Attending: Gastroenterology

## 2016-03-17 ENCOUNTER — Encounter (HOSPITAL_COMMUNITY): Payer: Self-pay

## 2016-03-17 ENCOUNTER — Other Ambulatory Visit: Payer: Self-pay | Admitting: Gastroenterology

## 2016-03-17 ENCOUNTER — Ambulatory Visit (HOSPITAL_COMMUNITY)
Admission: RE | Admit: 2016-03-17 | Discharge: 2016-03-17 | Disposition: A | Discharge status: Home / Self Care | Payer: Medicare HMO | Source: Ambulatory Visit | Attending: Gastroenterology | Admitting: Gastroenterology

## 2016-03-17 DIAGNOSIS — Z1211 Encounter for screening for malignant neoplasm of colon: Secondary | ICD-10-CM | POA: Insufficient documentation

## 2016-03-17 DIAGNOSIS — I10 Essential (primary) hypertension: Secondary | ICD-10-CM | POA: Insufficient documentation

## 2016-03-17 DIAGNOSIS — G35 Multiple sclerosis: Secondary | ICD-10-CM | POA: Insufficient documentation

## 2016-03-17 DIAGNOSIS — Z86718 Personal history of other venous thrombosis and embolism: Secondary | ICD-10-CM | POA: Insufficient documentation

## 2016-03-17 DIAGNOSIS — M549 Dorsalgia, unspecified: Secondary | ICD-10-CM | POA: Diagnosis not present

## 2016-03-17 DIAGNOSIS — Z86010 Personal history of colonic polyps: Secondary | ICD-10-CM | POA: Insufficient documentation

## 2016-03-17 DIAGNOSIS — F329 Major depressive disorder, single episode, unspecified: Secondary | ICD-10-CM | POA: Insufficient documentation

## 2016-03-17 DIAGNOSIS — Z79899 Other long term (current) drug therapy: Secondary | ICD-10-CM | POA: Diagnosis not present

## 2016-03-17 DIAGNOSIS — Z993 Dependence on wheelchair: Secondary | ICD-10-CM | POA: Diagnosis not present

## 2016-03-17 DIAGNOSIS — K219 Gastro-esophageal reflux disease without esophagitis: Secondary | ICD-10-CM | POA: Diagnosis not present

## 2016-03-17 DIAGNOSIS — F17210 Nicotine dependence, cigarettes, uncomplicated: Secondary | ICD-10-CM | POA: Insufficient documentation

## 2016-03-17 DIAGNOSIS — Z86711 Personal history of pulmonary embolism: Secondary | ICD-10-CM | POA: Insufficient documentation

## 2016-03-17 DIAGNOSIS — Z7901 Long term (current) use of anticoagulants: Secondary | ICD-10-CM | POA: Diagnosis not present

## 2016-03-17 DIAGNOSIS — G8929 Other chronic pain: Secondary | ICD-10-CM | POA: Diagnosis not present

## 2016-03-17 HISTORY — PX: COLONOSCOPY WITH PROPOFOL: SHX5780

## 2016-03-17 SURGERY — COLONOSCOPY WITH PROPOFOL
Anesthesia: Monitor Anesthesia Care

## 2016-03-17 SURGERY — COLONOSCOPY
Anesthesia: Monitor Anesthesia Care

## 2016-03-17 MED ORDER — MEPERIDINE HCL 100 MG/ML IJ SOLN: 6.2500 mg | INTRAMUSCULAR | Status: DC | PRN

## 2016-03-17 MED ORDER — PROPOFOL 10 MG/ML IV BOLUS
INTRAVENOUS | Status: DC | PRN
Start: 2016-03-17 — End: 2016-03-17
  Administered 2016-03-17: 20 mg via INTRAVENOUS

## 2016-03-17 MED ORDER — PROPOFOL 500 MG/50ML IV EMUL
INTRAVENOUS | Status: DC | PRN
Start: 2016-03-17 — End: 2016-03-17
  Administered 2016-03-17: 50 ug/kg/min via INTRAVENOUS

## 2016-03-17 MED ORDER — HEPARIN SOD (PORK) LOCK FLUSH 100 UNIT/ML IV SOLN
500.0000 [IU] | INTRAVENOUS | Status: AC | PRN
  Administered 2016-03-17: 500 [IU]

## 2016-03-17 MED ORDER — HYDROMORPHONE HCL 1 MG/ML IJ SOLN: 0.2500 mg | INTRAMUSCULAR | Status: DC | PRN

## 2016-03-17 MED ORDER — ONDANSETRON HCL 4 MG/2ML IJ SOLN
4.0000 mg | Freq: Once | INTRAMUSCULAR | Status: AC | PRN
  Administered 2016-03-17: 4 mg via INTRAVENOUS

## 2016-03-17 MED ORDER — LACTATED RINGERS IV SOLN
INTRAVENOUS | Status: DC
  Administered 2016-03-17: 1000 mL via INTRAVENOUS

## 2016-03-17 MED ORDER — SODIUM CHLORIDE 0.9 % IV SOLN: INTRAVENOUS | Status: DC

## 2016-03-17 MED ORDER — PROPOFOL 10 MG/ML IV BOLUS
INTRAVENOUS | Status: AC
  Filled 2016-03-17: qty 40

## 2016-03-17 MED ORDER — LIDOCAINE HCL (CARDIAC) 20 MG/ML IV SOLN
INTRAVENOUS | Status: DC | PRN
  Administered 2016-03-17: 50 mg via INTRATRACHEAL

## 2016-03-17 MED ORDER — LIDOCAINE HCL (CARDIAC) 20 MG/ML IV SOLN
INTRAVENOUS | Status: AC
  Filled 2016-03-17: qty 5

## 2016-03-17 SURGICAL SUPPLY — 21 items

## 2016-03-17 NOTE — Op Note (Signed)
[facility] Patient Name: [name] Procedure Date: 03/17/2016 MRN: [id number] Attending MD: Tresea Mall , MD Date of Birth: [birth year] CSN: [id number] Age: 55 Admit Type: Outpatient Procedure:                Colonoscopy Indications:              High risk colon cancer surveillance: Personal                            history of colonic polyps, Last colonoscopy: 2012 Providers:                Llana Aliment. Randa Evens, MD, Dow Adolph, RN, Kandice Robinsons, Technician Referring MD:              Medicines:                Monitored Anesthesia Care Complications:            No immediate complications. Estimated Blood Loss:     Estimated blood loss: none. Procedure:                Pre-Anesthesia Assessment:                           - Prior to the procedure, a History and Physical                            was performed, and patient medications and                            allergies were reviewed. The patient's tolerance of                            previous anesthesia was also reviewed. The risks                            and benefits of the procedure and the sedation                            options and risks were discussed with the patient.                            All questions were answered, and informed consent                            was obtained. Prior Anticoagulants: The patient has                            taken Coumadin (warfarin), last dose was 3 days                            prior to procedure. ASA Grade Assessment: III - A  patient with severe systemic disease. After                            reviewing the risks and benefits, the patient was                            deemed in satisfactory condition to undergo the                            procedure.                           After obtaining informed consent, the colonoscope                            was passed under direct vision.  Throughout the                            procedure, the patient's blood pressure, pulse, and                            oxygen saturations were monitored continuously. The                            EC-[id number] ([id number]) scope was introduced through                            the anus and advanced to the the cecum, identified                            by appendiceal orifice and ileocecal valve. The                            colonoscopy was somewhat difficult due to                            inadequate bowel prep. The patient tolerated the                            procedure well. The quality of the bowel                            preparation was poor. The ileocecal valve was                            photographed. despite instructions to the contrary,                            the patient had had solid food yesterday including                            corn on the cob. Findings:      The perianal and digital rectal examinations were normal.      There is no endoscopic evidence of mass,  polyps or tumor in the entire       colon.      A small amount of stool was found in the entire colon, interfering with       visualization. we were able to suction liquid stool but unable to remove       the corn. Impression:               - Preparation of the colon was poor.                           - Stool in the entire examined colon.                           - No specimens collected.                           - Personal history of colonic polyps. approximately                            85 to 90% of the: was seen well. The rest could not                            be seen because of multiple kernels of corn to                            cannot be suction Moderate Sedation:      MAC by anesthesia Recommendation:           - Patient has a contact number available for                            emergencies. The signs and symptoms of potential                            delayed complications  were discussed with the                            patient. Return to normal activities tomorrow.                            Written discharge instructions were provided to the                            patient.                           - Resume previous diet.                           - Continue present medications.                           - Resume Coumadin (warfarin) at prior dose today.                           - Repeat colonoscopy in 2 years for surveillance.  this will be done in 2 years cause of the poor prep Procedure Code(s):        --- Professional ---                           G0105, Colorectal cancer screening; colonoscopy on                            individual at high risk Diagnosis Code(s):        --- Professional ---                           Z86.010, Personal history of colonic polyps CPT copyright 2016 [employer]. All rights reserved. The codes documented in this report are preliminary and upon coder review may  be revised to meet current compliance requirements. [name], MD Tresea Mall, MD 03/17/2016 10:55:41 AM This report has been signed electronically. Number of Addenda: 0

## 2016-03-17 NOTE — Discharge Instructions (Addendum)
Colonoscopy, Care After Refer to this sheet in the next few weeks. These instructions provide you with information on caring for yourself after your procedure. Your health care provider may also give you more specific instructions. Your treatment has been planned according to current medical practices, but problems sometimes occur. Call your health care provider if you have any problems or questions after your procedure. WHAT TO EXPECT AFTER THE PROCEDURE  After your procedure, it is typical to have the following:  A small amount of blood in your stool.  Moderate amounts of gas and mild abdominal cramping or bloating. HOME CARE INSTRUCTIONS  Do not drive, operate machinery, or sign important documents for 24 hours.  You may shower and resume your regular physical activities, but move at a slower pace for the first 24 hours.  Take frequent rest periods for the first 24 hours.  Walk around or put a warm pack on your abdomen to help reduce abdominal cramping and bloating.  Drink enough fluids to keep your urine clear or pale yellow.  You may resume your normal diet as instructed by your health care provider. Avoid heavy or fried foods that are hard to digest.  Avoid drinking alcohol for 24 hours or as instructed by your health care provider.  Only take over-the-counter or prescription medicines as directed by your health care provider.  If a tissue sample (biopsy) was taken during your procedure:  Do not take aspirin or blood thinners for 7 days, or as instructed by your health care provider.  Do not drink alcohol for 7 days, or as instructed by your health care provider.  Eat soft foods for the first 24 hours. SEEK MEDICAL CARE IF: You have persistent spotting of blood in your stool 2-3 days after the procedure. SEEK IMMEDIATE MEDICAL CARE IF:  You have more than a small spotting of blood in your stool.  You pass large blood clots in your stool.  Your abdomen is swollen  (distended).  You have nausea or vomiting.  You have a fever.  You have increasing abdominal pain that is not relieved with medicine.   This information is not intended to replace advice given to you by your health care provider. Make sure you discuss any questions you have with your health care provider.   Document Released: 06/10/2004 Document Revised: 08/17/2013 Document Reviewed: 07/04/2013 Elsevier Interactive Patient Education [employer]  Repeat colon in 2 years

## 2016-03-17 NOTE — Anesthesia Postprocedure Evaluation (Signed)
Anesthesia Post Note  Patient: [name]  Procedure(s) Performed: Procedure(s) (LRB): COLONOSCOPY WITH PROPOFOL (N/A)  Patient location during evaluation: PACU Anesthesia Type: MAC Level of consciousness: awake and alert Pain management: pain level controlled Vital Signs Assessment: post-procedure vital signs reviewed and stable Respiratory status: spontaneous breathing, nonlabored ventilation, respiratory function stable and patient connected to nasal cannula oxygen Cardiovascular status: stable and blood pressure returned to baseline Anesthetic complications: no    Last Vitals:  Filed Vitals:   03/17/16 1110 03/17/16 1120  BP: 100/57 113/55  Pulse: 56 56  Temp:    Resp: 14 14    Last Pain: There were no vitals filed for this visit.               Aissata Wilmore DAVID

## 2016-03-17 NOTE — Transfer of Care (Signed)
Immediate Anesthesia Transfer of Care Note  Patient: [name]  Procedure(s) Performed: Procedure(s): COLONOSCOPY WITH PROPOFOL (N/A)  Patient Location: PACU  Anesthesia Type:MAC  Level of Consciousness:  sedated, patient cooperative and responds to stimulation  Airway & Oxygen Therapy:Patient Spontanous Breathing and Patient connected to face mask oxgen  Post-op Assessment:  Report given to PACU RN and Post -op Vital signs reviewed and stable  Post vital signs:  Reviewed and stable  Last Vitals:  Filed Vitals:   03/17/16 0945  BP: 96/60  Pulse: 61  Temp: 36.7 C  Resp: 16    Complications: No apparent anesthesia complications

## 2016-03-17 NOTE — Anesthesia Procedure Notes (Signed)
Procedure Name: MAC Date/Time: 03/17/2016 10:20 AM Performed by: Wynonia Sours Pre-anesthesia Checklist: Patient identified, Timeout performed, Emergency Drugs available, Suction available and Patient being monitored Patient Re-evaluated:Patient Re-evaluated prior to inductionOxygen Delivery Method: Non-rebreather mask Dental Injury: Teeth and Oropharynx as per pre-operative assessment

## 2016-03-17 NOTE — H&P (Signed)
Subjective:   Patient is a 55 y.o. male presents with A history of colon polyps. He has MS is confined to wheelchair. He is on anticoagulation to prevent blood clots. He had a small polyp 1/12 and is back now for follow-up of that polyp. He's on chronic narcotics for pain Long discussion with the patient about the benefits risk of colonoscopy and he felt that he could successfully go ahead with a: prep at home. He has been on anticoagulation with his Coumadin has been on hold for 3 days.. Procedure including risks and benefits discussed in office.  Patient Active Problem List   Diagnosis Date Noted  . Chronic cluster headache, not intractable   . Community acquired pneumonia   . TB lung, latent   . HCAP (healthcare-associated pneumonia) 02/16/2016  . Hypokalemia 02/16/2016  . Cluster headache   . DVT (deep venous thrombosis) (HCC)   . Depression   . GERD (gastroesophageal reflux disease)   . HTN (hypertension)   . Essential hypertension   . Gastroesophageal reflux disease without esophagitis   . CAP (community acquired pneumonia)   . Multiple sclerosis (HCC) 08/02/2013  . ABDOMINAL BLOATING 10/14/2010  . LOOSE STOOLS 10/14/2010  . PULMONARY EMBOLISM, HX OF 10/14/2010   Past Medical History  Diagnosis Date  . Gallstones 02/17/2009    resolved after gallbladder surgery.  . Multiple sclerosis (HCC)     Dx. 2005 - Dr. Tinnie Gens follows LOV 4'17 tx. Tysabri monthly IV-[facility] , [place],.  [name] Chronic pain syndrome 01/25/2008  . Insomnia 11/06/2008  . Anxiety   . Other specified visual disturbances   . Nonspecific elevation of levels of transaminase or lactic acid dehydrogenase (LDH)   . Abdominal pain, unspecified site   . Benign paroxysmal positional vertigo   . Other syndromes affecting cervical region   . Memory loss     no an issue at present 03-07-16  . Gait abnormality     "uses mobile wheelchair, but is ambulatory"  . DVT (deep venous thrombosis) (HCC)     in  the left arm '09  . Arthritis   . Pneumonia 2009  . Headache(784.0)     cluster headaches frequently-takes Topamax daily  . Joint pain   . Joint swelling     03-07-16 "swelling of right wrist" "after a fall-xray done 03-06-16 "no fractures".  . Chronic back pain   . History of colonoscopy   . Depression     takes Zoloft daily  . GERD (gastroesophageal reflux disease)     takes Omeprazole as needed  . HTN (hypertension)     takes Lisinopril,Verapamil,and Triamterene HCTZ daily  . Cluster headache   . Trigeminal neuralgia      history" Multiple sclerosis"    Past Surgical History  Procedure Laterality Date  . Cholecystectomy  02/20/2009  . Portacath placement N/A 03/27/2014    Procedure: INSERTION PORT-A-CATH;  Surgeon: [name], MD;  Location: [facility] OR;  Service: General;  Laterality: N/A;  . Port a cath revision N/A 07/06/2015    Procedure: Removal and replacement of PORT A CATH;  Surgeon: [name], MD;  Location: [facility] OR;  Service: General;  Laterality: N/A;    Prescriptions prior to admission  Medication Sig Dispense Refill Last Dose  . baclofen (LIORESAL) 20 MG tablet Take 20 mg by mouth 4 (four) times daily as needed for muscle spasms.    03/17/2016 at 0445  . Cefixime (SUPRAX) 400 MG CAPS capsule Take 1 capsule (400 mg  total) by mouth daily. 6 capsule 0 03/17/2016 at 0445  . dalfampridine (AMPYRA) 10 MG TB12 Take 10 mg by mouth 2 (two) times daily.   03/17/2016 at 0445  . Eszopiclone (ESZOPICLONE) 3 MG TABS Take 3 mg by mouth at bedtime as needed (for sleep).    Past Week at Unknown time  . gabapentin (NEURONTIN) 600 MG tablet Take 600 mg by mouth 4 (four) times daily.    03/17/2016 at 0445  . levETIRAcetam (KEPPRA) 500 MG tablet Take 500 mg by mouth 2 (two) times daily.  5 03/17/2016 at 0445  . lisinopril-hydrochlorothiazide (PRINZIDE,ZESTORETIC) 10-12.5 MG per tablet Take 1 tablet by mouth daily.   03/17/2016 at 0445  . Oxcarbazepine (TRILEPTAL) 300 MG tablet Take 300 mg by mouth  3 (three) times daily.  5 03/17/2016 at 0445  . oxyCODONE (OXY IR/ROXICODONE) 5 MG immediate release tablet Take 5 mg by mouth every 6 (six) hours as needed (For pain.).    03/17/2016 at 0445  . oxyCODONE-acetaminophen (PERCOCET) 10-325 MG tablet Take 1-2 tablets by mouth every 6 (six) hours as needed for pain.    03/17/2016 at 0445  . sertraline (ZOLOFT) 50 MG tablet Take 50 mg by mouth daily.    03/17/2016 at 0445  . topiramate (TOPAMAX) 50 MG tablet Take 50 mg by mouth 2 (two) times daily.  5 03/17/2016 at 0445  . verapamil (CALAN) 80 MG tablet Take 0.5 tablets (40 mg total) by mouth 2 (two) times daily.   03/17/2016 at 0445  . warfarin (COUMADIN) 5 MG tablet Take 5 mg by mouth every evening.    Past Week at 4pm  . pantoprazole (PROTONIX) 40 MG tablet Take 1 tablet (40 mg total) by mouth daily. (Patient not taking: Reported on 02/15/2016) 20 tablet 0 Not Taking   No Known Allergies  Social History  Substance Use Topics  . Smoking status: Current Every Day Smoker -- 1.00 packs/day for 38 years    Types: Cigarettes  . Smokeless tobacco: Never Used  . Alcohol Use: 0.0 oz/week     Comment: rare social    Family History  Problem Relation Age of Onset  . Cancer Father   . Diabetes Mother      Objective:   Patient Vitals for the past 8 hrs:  BP Temp Temp src Pulse Resp SpO2 Height Weight  03/17/16 0945 96/60 mmHg 98 F (36.7 C) Oral 61 16 95 % 5\' 9"  (1.753 m) 84.369 kg (186 lb)         See MD Preop evaluation      Assessment:   1. History of colon polyps 2. MS. Patient confined to wheelchair. 3. Chronic pain syndrome on narcotics 4. Chronically anticoagulated due to history of DVT's  Plan:   We will proceed with surveillance colonoscopy. The patient's Coumadin has been on hold for 3 days. We will resume today if no polyps are removed. This is been discussed in detail with him.

## 2016-03-17 NOTE — Anesthesia Preprocedure Evaluation (Signed)
Anesthesia Evaluation  Patient identified by MRN, date of birth, ID band Patient awake    Reviewed: Allergy & Precautions, NPO status , Patient's Chart, lab work & pertinent test results  Airway Mallampati: I  TM Distance: >3 FB Neck ROM: Full    Dental   Pulmonary Current Smoker,    Pulmonary exam normal        Cardiovascular hypertension, Pt. on medications Normal cardiovascular exam     Neuro/Psych Anxiety Depression Pt has MS    GI/Hepatic GERD  Controlled and Medicated,  Endo/Other    Renal/GU      Musculoskeletal   Abdominal   Peds  Hematology   Anesthesia Other Findings   Reproductive/Obstetrics                             Anesthesia Physical Anesthesia Plan  ASA: III  Anesthesia Plan: MAC   Post-op Pain Management:    Induction: Intravenous  Airway Management Planned: Natural Airway  Additional Equipment:   Intra-op Plan:   Post-operative Plan:   Informed Consent: I have reviewed the patients History and Physical, chart, labs and discussed the procedure including the risks, benefits and alternatives for the proposed anesthesia with the patient or authorized representative who has indicated his/her understanding and acceptance.     Plan Discussed with: CRNA and Surgeon  Anesthesia Plan Comments:         Anesthesia Quick Evaluation

## 2016-03-18 ENCOUNTER — Telehealth: Payer: Self-pay

## 2016-03-18 ENCOUNTER — Ambulatory Visit (INDEPENDENT_AMBULATORY_CARE_PROVIDER_SITE_OTHER): Payer: Medicare HMO | Admitting: Internal Medicine

## 2016-03-18 ENCOUNTER — Encounter (HOSPITAL_COMMUNITY): Payer: Self-pay | Admitting: Gastroenterology

## 2016-03-18 VITALS — BP 90/59 | HR 60 | Temp 97.7°F | Ht 69.0 in | Wt 192.0 lb

## 2016-03-18 DIAGNOSIS — R7611 Nonspecific reaction to tuberculin skin test without active tuberculosis: Secondary | ICD-10-CM | POA: Diagnosis not present

## 2016-03-18 DIAGNOSIS — I82409 Acute embolism and thrombosis of unspecified deep veins of unspecified lower extremity: Secondary | ICD-10-CM | POA: Diagnosis not present

## 2016-03-18 DIAGNOSIS — Z227 Latent tuberculosis: Secondary | ICD-10-CM

## 2016-03-18 MED ORDER — RIFAMPIN 300 MG PO CAPS: 300.0000 mg | ORAL_CAPSULE | Freq: Two times a day (BID) | ORAL | Status: DC

## 2016-03-18 MED ORDER — ONDANSETRON 8 MG PO TBDP: 8.0000 mg | ORAL_TABLET | Freq: Three times a day (TID) | ORAL | Status: DC | PRN

## 2016-03-18 MED ORDER — ENOXAPARIN SODIUM 120 MG/0.8ML ~~LOC~~ SOLN: 120.0000 mg | SUBCUTANEOUS | Status: DC

## 2016-03-18 NOTE — Progress Notes (Signed)
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   MRN: [id number]  HPI  [name] is a 55yo M with hx of MS, HTN, hx of PE/DVT who was seen in late March for Latent TB treatment. Decision was to start INH  Since he had been on coumadin. He started the medicatioon at end of March and within a week, did not tolerate it, initially with some lightheadedness and nausea. IT progressed to having fevers, myalgias, dizziness, with falling. He was instructed to stop taking medicaiton, but also evaluated in hospital where he was found to have pneumonia. He has recovered, and symptoms resolved.   He returns to clinic for alternative treatment to inh. We will do rifampin but will have to change him off of coumadin to lovenox to minimize drug side effect  He reports that oncology had told him he needs life long anticoagulation but this stems from a upper arm dvt from 2009 and recurrent PE.  Sees dr [name], [place], neurologist for his MS. He gets his infusions through cancer center/implanted port  No Known Allergies Current Outpatient Prescriptions on File Prior to Visit  Medication Sig Dispense Refill  . baclofen (LIORESAL) 20 MG tablet Take 20 mg by mouth 4 (four) times daily as needed for muscle spasms.     . Cefixime (SUPRAX) 400 MG CAPS capsule Take 1 capsule (400 mg total) by mouth daily. 6 capsule 0  . dalfampridine (AMPYRA) 10 MG TB12 Take 10 mg by mouth 2 (two) times daily.    . Eszopiclone (ESZOPICLONE) 3 MG TABS Take 3 mg by mouth at bedtime as needed (for sleep).     . gabapentin (NEURONTIN) 600 MG tablet Take 600 mg by mouth 4 (four) times daily.     [name] levETIRAcetam (KEPPRA) 500 MG tablet Take 500 mg by mouth 2 (two) times daily.  5  . lisinopril-hydrochlorothiazide (PRINZIDE,ZESTORETIC) 10-12.5 MG per tablet Take 1 tablet by mouth daily.    . Oxcarbazepine (TRILEPTAL) 300 MG tablet Take 300 mg by mouth 3 (three) times daily.  5  .  oxyCODONE (OXY IR/ROXICODONE) 5 MG immediate release tablet Take 5 mg by mouth every 6 (six) hours as needed (For pain.).     [name] oxyCODONE-acetaminophen (PERCOCET) 10-325 MG tablet Take 1-2 tablets by mouth every 6 (six) hours as needed for pain.     [name] sertraline (ZOLOFT) 50 MG tablet Take 50 mg by mouth daily.     [name] topiramate (TOPAMAX) 50 MG tablet Take 50 mg by mouth 2 (two) times daily.  5  . verapamil (CALAN) 80 MG tablet Take 0.5 tablets (40 mg total) by mouth 2 (two) times daily.    [name] warfarin (COUMADIN) 5 MG tablet Take 5 mg by mouth every evening.      Current Facility-Administered Medications on File Prior to Visit  Medication Dose Route Frequency Provider Last Rate Last Dose  . sodium chloride 0.9 % injection 10 mL  10 mL Intracatheter PRN Loann Quill, NP   10 mL at 11/07/15 1012   Active Ambulatory Problems    Diagnosis Date Noted  . ABDOMINAL BLOATING 10/14/2010  . LOOSE STOOLS 10/14/2010  . PULMONARY EMBOLISM, HX OF 10/14/2010  . Multiple sclerosis (HCC) 08/02/2013  . Cluster headache   . DVT (deep venous thrombosis) (HCC)   . Depression   . GERD (gastroesophageal reflux disease)   . HTN (hypertension)   . HCAP (healthcare-associated  pneumonia) 02/16/2016  . Hypokalemia 02/16/2016  . Essential hypertension   . Gastroesophageal reflux disease without esophagitis   . CAP (community acquired pneumonia)   . Chronic cluster headache, not intractable   . Community acquired pneumonia   . TB lung, latent    Resolved Ambulatory Problems    Diagnosis Date Noted  . No Resolved Ambulatory Problems   Past Medical History  Diagnosis Date  . Gallstones 02/17/2009  . Chronic pain syndrome 01/25/2008  . Insomnia 11/06/2008  . Anxiety   . Other specified visual disturbances   . Nonspecific elevation of levels of transaminase or lactic acid dehydrogenase (LDH)   . Abdominal pain, unspecified site   . Benign paroxysmal positional vertigo   . Other syndromes affecting  cervical region   . Memory loss   . Gait abnormality   . Arthritis   . Pneumonia 2009  . Headache(784.0)   . Joint pain   . Joint swelling   . Chronic back pain   . History of colonoscopy   . 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  And will communicate to dr [name] and adveburre  rtc in 4 wk

## 2016-03-18 NOTE — Telephone Encounter (Signed)
Called [facility] pharmacy and spoke with Ree Kida to give Dr. Feliz Beam verbal order to discontinue Coumadin because patient is going on Lovenox for 4 months. Ree Kida stated that he will take meds off of automatic refill and not inactivate the prescription due to patient eventually going back on Coumadin prescription. Rejeana Brock, LPN

## 2016-03-19 ENCOUNTER — Inpatient Hospital Stay: Payer: Medicare HMO

## 2016-03-19 VITALS — BP 117/76 | HR 59 | Temp 97.1°F | Resp 17

## 2016-03-19 DIAGNOSIS — G35 Multiple sclerosis: Secondary | ICD-10-CM | POA: Diagnosis present

## 2016-03-19 DIAGNOSIS — Z79899 Other long term (current) drug therapy: Secondary | ICD-10-CM | POA: Diagnosis not present

## 2016-03-19 MED ORDER — ACETAMINOPHEN 500 MG PO TABS
1000.0000 mg | ORAL_TABLET | Freq: Once | ORAL | Status: AC
  Administered 2016-03-19: 1000 mg via ORAL
  Filled 2016-03-19: qty 2

## 2016-03-19 MED ORDER — SODIUM CHLORIDE 0.9 % IV SOLN
Freq: Once | INTRAVENOUS | Status: AC
  Administered 2016-03-19: 11:00:00 via INTRAVENOUS
  Filled 2016-03-19: qty 1000

## 2016-03-19 MED ORDER — SODIUM CHLORIDE 0.9 % IV SOLN
300.0000 mg | Freq: Once | INTRAVENOUS | Status: AC
  Administered 2016-03-19: 300 mg via INTRAVENOUS
  Filled 2016-03-19: qty 15

## 2016-03-19 MED ORDER — SODIUM CHLORIDE 0.9 % IJ SOLN
10.0000 mL | Freq: Once | INTRAMUSCULAR | Status: AC
  Administered 2016-03-19: 10 mL
  Filled 2016-03-19: qty 10

## 2016-03-19 MED ORDER — HEPARIN SOD (PORK) LOCK FLUSH 100 UNIT/ML IV SOLN
500.0000 [IU] | Freq: Once | INTRAVENOUS | Status: AC
  Administered 2016-03-19: 500 [IU]
  Filled 2016-03-19: qty 5

## 2016-03-21 ENCOUNTER — Inpatient Hospital Stay: Payer: Medicare Other

## 2016-03-25 ENCOUNTER — Inpatient Hospital Stay: Payer: Medicare HMO

## 2016-04-01 ENCOUNTER — Ambulatory Visit: Payer: Self-pay

## 2016-04-01 ENCOUNTER — Ambulatory Visit: Payer: Self-pay | Admitting: Internal Medicine

## 2016-04-08 ENCOUNTER — Inpatient Hospital Stay: Payer: Medicare HMO

## 2016-04-11 ENCOUNTER — Inpatient Hospital Stay: Payer: Medicare Other

## 2016-04-18 ENCOUNTER — Inpatient Hospital Stay (HOSPITAL_BASED_OUTPATIENT_CLINIC_OR_DEPARTMENT_OTHER): Payer: Medicare HMO | Admitting: Oncology

## 2016-04-18 ENCOUNTER — Inpatient Hospital Stay: Payer: Medicare HMO | Attending: Oncology

## 2016-04-18 VITALS — BP 98/58 | HR 63 | Temp 98.1°F | Resp 18

## 2016-04-18 DIAGNOSIS — G35 Multiple sclerosis: Secondary | ICD-10-CM | POA: Insufficient documentation

## 2016-04-18 DIAGNOSIS — F329 Major depressive disorder, single episode, unspecified: Secondary | ICD-10-CM | POA: Insufficient documentation

## 2016-04-18 DIAGNOSIS — F419 Anxiety disorder, unspecified: Secondary | ICD-10-CM | POA: Diagnosis not present

## 2016-04-18 DIAGNOSIS — F17210 Nicotine dependence, cigarettes, uncomplicated: Secondary | ICD-10-CM | POA: Diagnosis not present

## 2016-04-18 DIAGNOSIS — I10 Essential (primary) hypertension: Secondary | ICD-10-CM | POA: Insufficient documentation

## 2016-04-18 DIAGNOSIS — Z79899 Other long term (current) drug therapy: Secondary | ICD-10-CM | POA: Insufficient documentation

## 2016-04-18 DIAGNOSIS — K219 Gastro-esophageal reflux disease without esophagitis: Secondary | ICD-10-CM | POA: Insufficient documentation

## 2016-04-18 DIAGNOSIS — Z86718 Personal history of other venous thrombosis and embolism: Secondary | ICD-10-CM | POA: Diagnosis not present

## 2016-04-18 DIAGNOSIS — M1990 Unspecified osteoarthritis, unspecified site: Secondary | ICD-10-CM | POA: Diagnosis not present

## 2016-04-18 MED ORDER — SODIUM CHLORIDE 0.9 % IV SOLN
Freq: Once | INTRAVENOUS | Status: AC
  Administered 2016-04-18: 11:00:00 via INTRAVENOUS
  Filled 2016-04-18: qty 1000

## 2016-04-18 MED ORDER — SODIUM CHLORIDE 0.9% FLUSH
10.0000 mL | INTRAVENOUS | Status: DC | PRN
  Administered 2016-04-18: 10 mL via INTRAVENOUS
  Filled 2016-04-18: qty 10

## 2016-04-18 MED ORDER — SODIUM CHLORIDE 0.9 % IV SOLN
300.0000 mg | Freq: Once | INTRAVENOUS | Status: AC
  Administered 2016-04-18: 300 mg via INTRAVENOUS
  Filled 2016-04-18: qty 15

## 2016-04-18 MED ORDER — HEPARIN SOD (PORK) LOCK FLUSH 100 UNIT/ML IV SOLN
500.0000 [IU] | Freq: Once | INTRAVENOUS | Status: AC
  Administered 2016-04-18: 500 [IU] via INTRAVENOUS
  Filled 2016-04-18: qty 5

## 2016-04-18 MED ORDER — ACETAMINOPHEN 500 MG PO TABS
1000.0000 mg | ORAL_TABLET | Freq: Once | ORAL | Status: AC
  Administered 2016-04-18: 1000 mg via ORAL
  Filled 2016-04-18: qty 2

## 2016-04-22 ENCOUNTER — Encounter: Payer: Self-pay | Admitting: Internal Medicine

## 2016-04-22 ENCOUNTER — Inpatient Hospital Stay: Payer: Medicare HMO

## 2016-04-22 ENCOUNTER — Ambulatory Visit (INDEPENDENT_AMBULATORY_CARE_PROVIDER_SITE_OTHER): Payer: Medicare HMO | Admitting: Internal Medicine

## 2016-04-22 VITALS — BP 100/67 | HR 64 | Temp 97.6°F

## 2016-04-22 DIAGNOSIS — I82409 Acute embolism and thrombosis of unspecified deep veins of unspecified lower extremity: Secondary | ICD-10-CM | POA: Diagnosis not present

## 2016-04-22 DIAGNOSIS — R7611 Nonspecific reaction to tuberculin skin test without active tuberculosis: Secondary | ICD-10-CM | POA: Diagnosis not present

## 2016-04-22 DIAGNOSIS — G35 Multiple sclerosis: Secondary | ICD-10-CM

## 2016-04-22 DIAGNOSIS — Z227 Latent tuberculosis: Secondary | ICD-10-CM

## 2016-04-22 LAB — COMPLETE METABOLIC PANEL WITH GFR
ALT: 87 U/L — ABNORMAL HIGH (ref 9–46)
AST: 83 U/L — ABNORMAL HIGH (ref 10–35)
Albumin: 4 g/dL (ref 3.6–5.1)
Alkaline Phosphatase: 72 U/L (ref 40–115)
BUN: 7 mg/dL (ref 7–25)
CHLORIDE: 105 mmol/L (ref 98–110)
CO2: 27 mmol/L (ref 20–31)
Calcium: 9.4 mg/dL (ref 8.6–10.3)
Creat: 0.84 mg/dL (ref 0.70–1.33)
Glucose, Bld: 92 mg/dL (ref 65–99)
Potassium: 4.4 mmol/L (ref 3.5–5.3)
Sodium: 142 mmol/L (ref 135–146)
Total Bilirubin: 0.5 mg/dL (ref 0.2–1.2)
Total Protein: 7.1 g/dL (ref 6.1–8.1)

## 2016-04-22 NOTE — Progress Notes (Signed)
RFV: ltbi   Subjective:    Patient ID: [name], male    DOB: 12-31-1960, 55 y.o.   MRN: [id number]  HPI [name] is a 55yo M with hx of MS, HTN, hx of PE/DVT who was seen in late March for Latent TB treatment. Decision was to start INH but did not tolerate it. Now on rifampin for the past 4 wk, currently on 5 wk of treatment.he has roughly 3 months to go to complete 4 month course. He was switched off of coumadin and onto lovenox for the time being of LTBI tx. He is doing well with his rifampin and not having too much nausea. He is in good spirits. He states that his urine is orange as expected while on treatment. No difficulty with taking lovenox injections. He denies that his MS is any worse presently  No Known Allergies Current Outpatient Prescriptions on File Prior to Visit  Medication Sig Dispense Refill  . baclofen (LIORESAL) 20 MG tablet Take 20 mg by mouth 4 (four) times daily as needed for muscle spasms.     . Cefixime (SUPRAX) 400 MG CAPS capsule Take 1 capsule (400 mg total) by mouth daily. 6 capsule 0  . dalfampridine (AMPYRA) 10 MG TB12 Take 10 mg by mouth 2 (two) times daily.    [name] enoxaparin (LOVENOX) 120 MG/0.8ML injection Inject 0.8 mLs (120 mg total) into the skin daily. 30 Syringe 3  . Eszopiclone (ESZOPICLONE) 3 MG TABS Take 3 mg by mouth at bedtime as needed (for sleep).     . gabapentin (NEURONTIN) 600 MG tablet Take 600 mg by mouth 4 (four) times daily.     [name] levETIRAcetam (KEPPRA) 500 MG tablet Take 500 mg by mouth 2 (two) times daily.  5  . lisinopril-hydrochlorothiazide (PRINZIDE,ZESTORETIC) 10-12.5 MG per tablet Take 1 tablet by mouth daily.    . ondansetron (ZOFRAN ODT) 8 MG disintegrating tablet Take 1 tablet (8 mg total) by mouth every 8 (eight) hours as needed for nausea or vomiting. 60 tablet 3  . Oxcarbazepine (TRILEPTAL) 300 MG tablet Take 300 mg by mouth 3 (three) times daily.  5  . oxyCODONE (OXY IR/ROXICODONE) 5 MG immediate release tablet Take 5 mg by  mouth every 6 (six) hours as needed (For pain.).     [name] oxyCODONE-acetaminophen (PERCOCET) 10-325 MG tablet Take 1-2 tablets by mouth every 6 (six) hours as needed for pain.     . rifampin (RIFADIN) 300 MG capsule Take 1 capsule (300 mg total) by mouth 2 (two) times daily. 60 capsule 3  . sertraline (ZOLOFT) 50 MG tablet Take 50 mg by mouth daily.     [name] topiramate (TOPAMAX) 50 MG tablet Take 50 mg by mouth 2 (two) times daily.  5  . verapamil (CALAN) 80 MG tablet Take 0.5 tablets (40 mg total) by mouth 2 (two) times daily.     Current Facility-Administered Medications on File Prior to Visit  Medication Dose Route Frequency Provider Last Rate Last Dose  . sodium chloride 0.9 % injection 10 mL  10 mL Intracatheter PRN Loann Quill, NP   10 mL at 11/07/15 1012   Active Ambulatory Problems    Diagnosis Date Noted  . ABDOMINAL BLOATING 10/14/2010  . LOOSE STOOLS 10/14/2010  . PULMONARY EMBOLISM, HX OF 10/14/2010  . Multiple sclerosis (HCC) 08/02/2013  . Cluster headache   . DVT (deep venous thrombosis) (HCC)   . Depression   . GERD (gastroesophageal reflux disease)   . HTN (hypertension)   .  HCAP (healthcare-associated pneumonia) 02/16/2016  . Hypokalemia 02/16/2016  . Essential hypertension   . Gastroesophageal reflux disease without esophagitis   . CAP (community acquired pneumonia)   . Chronic cluster headache, not intractable   . Community acquired pneumonia   . TB lung, latent    Resolved Ambulatory Problems    Diagnosis Date Noted  . No Resolved Ambulatory Problems   Past Medical History  Diagnosis Date  . Gallstones 02/17/2009  . Chronic pain syndrome 01/25/2008  . Insomnia 11/06/2008  . Anxiety   . Other specified visual disturbances   . Nonspecific elevation of levels of transaminase or lactic acid dehydrogenase (LDH)   . Abdominal pain, unspecified site   . Benign paroxysmal positional vertigo   . Other syndromes affecting cervical region   . Memory loss   . Gait  abnormality   . Arthritis   . Pneumonia 2009  . Headache(784.0)   . Joint pain   . Joint swelling   . Chronic back pain   . History of colonoscopy   . Trigeminal neuralgia       Review of Systems  Constitutional: Negative for fever, chills, diaphoresis, activity change, appetite change, fatigue and unexpected weight change.  HENT: Negative for congestion, sore throat, rhinorrhea, sneezing, trouble swallowing and sinus pressure.  Eyes: Negative for photophobia and visual disturbance.  Respiratory: Negative for cough, chest tightness, shortness of breath, wheezing and stridor.  Cardiovascular: Negative for chest pain, palpitations and leg swelling.  Gastrointestinal: Negative for nausea, vomiting, abdominal pain, diarrhea, constipation, blood in stool, abdominal distention and anal bleeding.  Genitourinary: Negative for dysuria, hematuria, flank pain and difficulty urinating.  Musculoskeletal: Negative for myalgias, back pain, joint swelling, arthralgias and gait problem.  Skin: Negative for color change, pallor, rash and wound.  Neurological: Negative for dizziness, tremors, weakness and light-headedness.  Hematological: Negative for adenopathy. Does not bruise/bleed easily.  Psychiatric/Behavioral: Negative for behavioral problems, confusion, sleep disturbance, dysphoric mood, decreased concentration and agitation.       Objective:   Physical Exam BP 100/67 mmHg  Pulse 64  Temp(Src) 97.6 F (36.4 C) (Oral) gen = a x o by 3 in NAD, in wheelchair HEENT = no oral pharyngeal erythema, no scleral icterus pulm = CTAB no w/c/r Cors = nl s1,s2, no g/m/r Skin = no rash, no jaudice       Assessment & Plan:  Latent tb infection (LTBI)- continue with rifampin. Needs to complete 11 more weeks, thru end of August to finish 4 month course of treatment  - continue zofran for nausea prn  History of multiple DVT- continue with lovenox while on rifampin to minimize interaction  MS =  to follow up with Dr. Tinnie Gens next month. If he needs to resume his MS medications, likely can do so but must take into consideration interaction with rifampin. Ideally, would like to complete his course of treatment through end of august   Cc: dr Tinnie Gens

## 2016-04-27 NOTE — Progress Notes (Signed)
[facility]  Telephone:([phone]) [phone] [phone]) [phone]  ID: [name] OB: July 04, 1961  MR#: [id number]  TGG#:[id number]  Patient Care Team: Fleet Contras, MD as PCP - General (Internal Medicine)  CHIEF COMPLAINT:  No chief complaint on file.  Regarding Tysabri infusions for MS  INTERVAL HISTORY: Patient returns to clinic today for routine 6 month evaluation. He continues to tolerate Tysabri infusions without side effects. He has continued talk regarding initiating treatment, but plans to continue Tysabri for now. Currently, he feels well and is at his baseline.  He has no new neurologic complaints.  He denies any recent fevers.  He has a good appetite and denies weight loss.  He has no chest pain or shortness of breath.  He denies any nausea, vomiting, constipation, or diarrhea.  He has no urinary complaints.  Patient offers no specific complaints today.  REVIEW OF SYSTEMS:   Review of Systems  Constitutional: Negative.  Negative for fever, weight loss and malaise/fatigue.  Respiratory: Negative.  Negative for cough and shortness of breath.   Cardiovascular: Negative.  Negative for chest pain.  Gastrointestinal: Negative.  Negative for nausea and vomiting.  Genitourinary: Negative.   Musculoskeletal: Negative.   Neurological: Negative.  Negative for weakness.  Psychiatric/Behavioral: Negative.     As per HPI. Otherwise, a complete review of systems is negatve.  PAST MEDICAL HISTORY: Past Medical History  Diagnosis Date  . Gallstones 02/17/2009    resolved after gallbladder surgery.  . Multiple sclerosis (HCC)     Dx. 2005 - Dr. Tinnie Gens follows LOV 4'17 tx. [facility]abri [facility] [place].  [name] Chronic pain syndrome 01/25/2008  . Insomnia 11/06/2008  . Anxiety   . Other specified visual disturbances   . Nonspecific elevation of levels of transaminase or lactic acid dehydrogenase (LDH)   . Abdominal pain, unspecified site   . Benign  paroxysmal positional vertigo   . Other syndromes affecting cervical region   . Memory loss     no an issue at present 03-07-16  . Gait abnormality     "uses mobile wheelchair, but is ambulatory"  . DVT (deep venous thrombosis) (HCC)     in the left arm '09  . Arthritis   . Pneumonia 2009  . Headache(784.0)     cluster headaches frequently-takes Topamax daily  . Joint pain   . Joint swelling     03-07-16 "swelling of right wrist" "after a fall-xray done 03-06-16 "no fractures".  . Chronic back pain   . History of colonoscopy   . Depression     takes Zoloft daily  . GERD (gastroesophageal reflux disease)     takes Omeprazole as needed  . HTN (hypertension)     takes Lisinopril,Verapamil,and Triamterene HCTZ daily  . Cluster headache   . Trigeminal neuralgia      history" Multiple sclerosis"    PAST SURGICAL HISTORY: Past Surgical History  Procedure Laterality Date  . Cholecystectomy  02/20/2009  . Portacath placement N/A 03/27/2014    Procedure: INSERTION PORT-A-CATH;  Surgeon: [name], MD;  Location: [facility] OR;  Service: General;  Laterality: N/A;  . Port a cath revision N/A 07/06/2015    Procedure: Removal and replacement of PORT A CATH;  Surgeon: [name], MD;  Location: [facility] OR;  Service: General;  Laterality: N/A;  . Colonoscopy with propofol N/A 03/17/2016    Procedure: COLONOSCOPY WITH PROPOFOL;  Surgeon: [name], MD;  Location: [facility] ENDOSCOPY;  Service: Endoscopy;  Laterality: N/A;  FAMILY HISTORY Family History  Problem Relation Age of Onset  . Cancer Father   . Diabetes Mother        ADVANCED DIRECTIVES:    HEALTH MAINTENANCE: Social History  Substance Use Topics  . Smoking status: Current Every Day Smoker -- 1.00 packs/day for 38 years    Types: Cigarettes  . Smokeless tobacco: Never Used     Comment: cutting back  . Alcohol Use: 0.0 oz/week    0 Standard drinks or equivalent per week     Comment: occasional      Colonoscopy:  PAP:  Bone density:  Lipid panel:  No Known Allergies  Current Outpatient Prescriptions  Medication Sig Dispense Refill  . baclofen (LIORESAL) 20 MG tablet Take 20 mg by mouth 4 (four) times daily as needed for muscle spasms.     . Cefixime (SUPRAX) 400 MG CAPS capsule Take 1 capsule (400 mg total) by mouth daily. 6 capsule 0  . dalfampridine (AMPYRA) 10 MG TB12 Take 10 mg by mouth 2 (two) times daily.    [name] enoxaparin (LOVENOX) 120 MG/0.8ML injection Inject 0.8 mLs (120 mg total) into the skin daily. 30 Syringe 3  . Eszopiclone (ESZOPICLONE) 3 MG TABS Take 3 mg by mouth at bedtime as needed (for sleep).     . gabapentin (NEURONTIN) 600 MG tablet Take 600 mg by mouth 4 (four) times daily.     [name] levETIRAcetam (KEPPRA) 500 MG tablet Take 500 mg by mouth 2 (two) times daily.  5  . lisinopril-hydrochlorothiazide (PRINZIDE,ZESTORETIC) 10-12.5 MG per tablet Take 1 tablet by mouth daily.    . ondansetron (ZOFRAN ODT) 8 MG disintegrating tablet Take 1 tablet (8 mg total) by mouth every 8 (eight) hours as needed for nausea or vomiting. 60 tablet 3  . Oxcarbazepine (TRILEPTAL) 300 MG tablet Take 300 mg by mouth 3 (three) times daily.  5  . oxyCODONE (OXY IR/ROXICODONE) 5 MG immediate release tablet Take 5 mg by mouth every 6 (six) hours as needed (For pain.).     [name] oxyCODONE-acetaminophen (PERCOCET) 10-325 MG tablet Take 1-2 tablets by mouth every 6 (six) hours as needed for pain.     . rifampin (RIFADIN) 300 MG capsule Take 1 capsule (300 mg total) by mouth 2 (two) times daily. 60 capsule 3  . sertraline (ZOLOFT) 50 MG tablet Take 50 mg by mouth daily.     [name] topiramate (TOPAMAX) 50 MG tablet Take 50 mg by mouth 2 (two) times daily.  5  . verapamil (CALAN) 80 MG tablet Take 0.5 tablets (40 mg total) by mouth 2 (two) times daily.     No current facility-administered medications for this visit.   Facility-Administered Medications Ordered in Other Visits  Medication Dose Route  Frequency Provider Last Rate Last Dose  . sodium chloride 0.9 % injection 10 mL  10 mL Intracatheter PRN Loann Quill, NP   10 mL at 11/07/15 1012    OBJECTIVE: There were no vitals filed for this visit.   There is no weight on file to calculate BMI.    ECOG FS:1 - Symptomatic but completely ambulatory  General: Well-developed, well-nourished, no acute distress. Patient in motorized wheelchair related to weakness. Eyes: anicteric sclera. Lungs: Clear to auscultation bilaterally. Heart: Regular rate and rhythm. No rubs, murmurs, or gallops. Abdomen: Soft, nontender, nondistended. No organomegaly noted, normoactive bowel sounds. Musculoskeletal: No edema, cyanosis, or clubbing. Neuro: Alert, answering all questions appropriately. Cranial nerves grossly intact. Skin: No rashes or petechiae noted.  Psych: Normal affect.   LAB RESULTS:  Lab Results  Component Value Date   NA 142 04/22/2016   K 4.4 04/22/2016   CL 105 04/22/2016   CO2 27 04/22/2016   GLUCOSE 92 04/22/2016   BUN 7 04/22/2016   CREATININE 0.84 04/22/2016   CALCIUM 9.4 04/22/2016   PROT 7.1 04/22/2016   ALBUMIN 4.0 04/22/2016   AST 83* 04/22/2016   ALT 87* 04/22/2016   ALKPHOS 72 04/22/2016   BILITOT 0.5 04/22/2016   GFRNONAA >89 04/22/2016   GFRAA >89 04/22/2016    Lab Results  Component Value Date   WBC 12.4* 02/16/2016   NEUTROABS 5.8 02/16/2016   HGB 12.6* 02/16/2016   HCT 37.0* 02/16/2016   MCV 82.3 02/16/2016   PLT 236 02/16/2016     STUDIES: No results found.  ASSESSMENT: Tysabri infusion for multiple sclerosis.  PLAN:    1.  Tysabri infusion for multiple sclerosis: Patient will receive 300 mg Tysabri today.  He will return to clinic every 4 weeks for continuation of infusions. We will continue current treatments unless directed by his primary neurologist, Dr. Tinnie Gens. He will return to clinic every 28 days for Tysabri and then in 6 months for routine evaluation.  Patient understands that  all of his laboratory work, including JC virus, will be monitored by his primary neurologist.  He also understands that if he has any questions regarding Tysabri or his multiple sclerosis but they should be directed towards Dr. Tinnie Gens.    Patient expressed understanding and was in agreement with this plan. He also understands that He can call clinic at any time with any questions, concerns, or complaints.    Jeralyn Ruths, MD   04/27/2016 8:31 AM

## 2016-04-29 ENCOUNTER — Ambulatory Visit: Payer: Self-pay

## 2016-05-06 ENCOUNTER — Inpatient Hospital Stay: Payer: Medicare HMO

## 2016-05-09 ENCOUNTER — Inpatient Hospital Stay: Payer: Medicare Other

## 2016-05-16 ENCOUNTER — Inpatient Hospital Stay: Payer: Medicare HMO | Attending: Oncology

## 2016-05-16 VITALS — BP 108/74 | HR 71 | Temp 98.3°F | Resp 18

## 2016-05-16 DIAGNOSIS — G35 Multiple sclerosis: Secondary | ICD-10-CM | POA: Diagnosis present

## 2016-05-16 DIAGNOSIS — Z79899 Other long term (current) drug therapy: Secondary | ICD-10-CM | POA: Diagnosis not present

## 2016-05-16 MED ORDER — SODIUM CHLORIDE 0.9 % IV SOLN
Freq: Once | INTRAVENOUS | Status: AC
  Administered 2016-05-16: 11:00:00 via INTRAVENOUS
  Filled 2016-05-16: qty 1000

## 2016-05-16 MED ORDER — ACETAMINOPHEN 500 MG PO TABS
1000.0000 mg | ORAL_TABLET | Freq: Once | ORAL | Status: AC
  Administered 2016-05-16: 1000 mg via ORAL
  Filled 2016-05-16: qty 2

## 2016-05-16 MED ORDER — SODIUM CHLORIDE 0.9 % IV SOLN
300.0000 mg | Freq: Once | INTRAVENOUS | Status: AC
  Administered 2016-05-16: 300 mg via INTRAVENOUS
  Filled 2016-05-16: qty 15

## 2016-05-16 MED ORDER — SODIUM CHLORIDE 0.9 % IJ SOLN
10.0000 mL | Freq: Once | INTRAMUSCULAR | Status: AC
  Administered 2016-05-16: 10 mL
  Filled 2016-05-16: qty 10

## 2016-05-16 MED ORDER — HEPARIN SOD (PORK) LOCK FLUSH 100 UNIT/ML IV SOLN
500.0000 [IU] | Freq: Once | INTRAVENOUS | Status: AC
  Administered 2016-05-16: 500 [IU]
  Filled 2016-05-16: qty 5

## 2016-05-20 ENCOUNTER — Inpatient Hospital Stay: Payer: Medicare HMO

## 2016-05-22 ENCOUNTER — Other Ambulatory Visit: Payer: Self-pay | Admitting: Psychiatry

## 2016-05-22 DIAGNOSIS — G35 Multiple sclerosis: Secondary | ICD-10-CM

## 2016-05-27 ENCOUNTER — Ambulatory Visit: Payer: Self-pay

## 2016-05-28 ENCOUNTER — Other Ambulatory Visit
Admission: RE | Admit: 2016-05-28 | Discharge: 2016-05-28 | Disposition: A | Discharge status: Home / Self Care | Payer: Medicare HMO | Source: Ambulatory Visit | Attending: Psychiatry | Admitting: Psychiatry

## 2016-05-28 ENCOUNTER — Other Ambulatory Visit: Payer: Medicare HMO

## 2016-05-28 DIAGNOSIS — G35 Multiple sclerosis: Secondary | ICD-10-CM | POA: Diagnosis present

## 2016-05-28 DIAGNOSIS — Z79899 Other long term (current) drug therapy: Secondary | ICD-10-CM | POA: Insufficient documentation

## 2016-05-29 LAB — HEPATITIS B SURFACE ANTIGEN: HEP B S AG: NEGATIVE

## 2016-05-29 LAB — HEPATITIS B CORE ANTIBODY, TOTAL: HEP B C TOTAL AB: POSITIVE — AB

## 2016-05-29 LAB — HEPATITIS B SURFACE ANTIBODY, QUANTITATIVE: HEPATITIS B-POST: 6.2 m[IU]/mL — AB

## 2016-06-03 ENCOUNTER — Inpatient Hospital Stay: Payer: Medicare HMO

## 2016-06-04 ENCOUNTER — Other Ambulatory Visit: Payer: Medicare HMO

## 2016-06-05 ENCOUNTER — Ambulatory Visit
Admission: RE | Admit: 2016-06-05 | Discharge: 2016-06-05 | Disposition: A | Discharge status: Home / Self Care | Payer: Medicare HMO | Source: Ambulatory Visit | Attending: Psychiatry | Admitting: Psychiatry

## 2016-06-05 DIAGNOSIS — G35 Multiple sclerosis: Secondary | ICD-10-CM

## 2016-06-05 MED ORDER — GADOBENATE DIMEGLUMINE 529 MG/ML IV SOLN
5.0000 mL | Freq: Once | INTRAVENOUS | Status: AC | PRN
  Administered 2016-06-05: 5 mL via INTRAVENOUS

## 2016-06-13 ENCOUNTER — Inpatient Hospital Stay: Payer: Medicare Other

## 2016-06-17 ENCOUNTER — Inpatient Hospital Stay: Payer: Medicare HMO

## 2016-06-20 ENCOUNTER — Ambulatory Visit: Payer: Medicare HMO

## 2016-06-24 ENCOUNTER — Ambulatory Visit: Payer: Self-pay

## 2016-06-24 ENCOUNTER — Inpatient Hospital Stay: Payer: Medicare Other | Attending: Oncology

## 2016-06-24 VITALS — BP 108/68 | HR 62 | Temp 98.0°F | Resp 18

## 2016-06-24 DIAGNOSIS — Z79899 Other long term (current) drug therapy: Secondary | ICD-10-CM | POA: Diagnosis not present

## 2016-06-24 DIAGNOSIS — G35 Multiple sclerosis: Secondary | ICD-10-CM | POA: Insufficient documentation

## 2016-06-24 MED ORDER — HEPARIN SOD (PORK) LOCK FLUSH 100 UNIT/ML IV SOLN
500.0000 [IU] | Freq: Once | INTRAVENOUS | Status: AC
  Administered 2016-06-24: 500 [IU]
  Filled 2016-06-24: qty 5

## 2016-06-24 MED ORDER — SODIUM CHLORIDE 0.9 % IJ SOLN
10.0000 mL | Freq: Once | INTRAMUSCULAR | Status: AC
  Administered 2016-06-24: 10 mL
  Filled 2016-06-24: qty 10

## 2016-06-24 MED ORDER — ACETAMINOPHEN 500 MG PO TABS
1000.0000 mg | ORAL_TABLET | Freq: Once | ORAL | Status: AC
  Administered 2016-06-24: 1000 mg via ORAL
  Filled 2016-06-24: qty 2

## 2016-06-24 MED ORDER — SODIUM CHLORIDE 0.9 % IV SOLN
Freq: Once | INTRAVENOUS | Status: AC
  Administered 2016-06-24: 10:00:00 via INTRAVENOUS
  Filled 2016-06-24: qty 1000

## 2016-06-24 MED ORDER — SODIUM CHLORIDE 0.9 % IV SOLN
300.0000 mg | Freq: Once | INTRAVENOUS | Status: AC
  Administered 2016-06-24: 300 mg via INTRAVENOUS
  Filled 2016-06-24: qty 15

## 2016-07-01 ENCOUNTER — Inpatient Hospital Stay: Payer: Medicare HMO

## 2016-07-08 ENCOUNTER — Encounter: Payer: Self-pay | Admitting: Internal Medicine

## 2016-07-08 ENCOUNTER — Ambulatory Visit (INDEPENDENT_AMBULATORY_CARE_PROVIDER_SITE_OTHER): Payer: Medicare Other | Admitting: Internal Medicine

## 2016-07-08 VITALS — BP 109/69 | HR 63 | Temp 97.5°F | Wt 196.0 lb

## 2016-07-08 DIAGNOSIS — G35 Multiple sclerosis: Secondary | ICD-10-CM

## 2016-07-08 DIAGNOSIS — R894 Abnormal immunological findings in specimens from other organs, systems and tissues: Secondary | ICD-10-CM

## 2016-07-08 DIAGNOSIS — B181 Chronic viral hepatitis B without delta-agent: Secondary | ICD-10-CM

## 2016-07-08 DIAGNOSIS — Z227 Latent tuberculosis: Secondary | ICD-10-CM

## 2016-07-08 DIAGNOSIS — R7611 Nonspecific reaction to tuberculin skin test without active tuberculosis: Secondary | ICD-10-CM

## 2016-07-08 DIAGNOSIS — R768 Other specified abnormal immunological findings in serum: Secondary | ICD-10-CM

## 2016-07-08 LAB — CBC WITH DIFFERENTIAL/PLATELET
BASOS ABS: 71 {cells}/uL (ref 0–200)
Basophils Relative: 1 %
EOS ABS: 284 {cells}/uL (ref 15–500)
Eosinophils Relative: 4 %
HCT: 43 % (ref 38.5–50.0)
Hemoglobin: 14.7 g/dL (ref 13.2–17.1)
LYMPHS PCT: 57 %
Lymphs Abs: 4047 cells/uL — ABNORMAL HIGH (ref 850–3900)
MCH: 27.4 pg (ref 27.0–33.0)
MCHC: 34.2 g/dL (ref 32.0–36.0)
MCV: 80.2 fL (ref 80.0–100.0)
MONOS PCT: 8 %
MPV: 9.5 fL (ref 7.5–12.5)
Monocytes Absolute: 568 cells/uL (ref 200–950)
NEUTROS PCT: 30 %
Neutro Abs: 2130 cells/uL (ref 1500–7800)
PLATELETS: 218 10*3/uL (ref 140–400)
RBC: 5.36 MIL/uL (ref 4.20–5.80)
RDW: 15.2 % — AB (ref 11.0–15.0)
WBC: 7.1 10*3/uL (ref 3.8–10.8)

## 2016-07-08 LAB — COMPLETE METABOLIC PANEL WITH GFR
ALT: 46 U/L (ref 9–46)
AST: 23 U/L (ref 10–35)
Albumin: 4.3 g/dL (ref 3.6–5.1)
Alkaline Phosphatase: 80 U/L (ref 40–115)
BILIRUBIN TOTAL: 0.3 mg/dL (ref 0.2–1.2)
BUN: 10 mg/dL (ref 7–25)
CO2: 27 mmol/L (ref 20–31)
CREATININE: 0.87 mg/dL (ref 0.70–1.33)
Calcium: 9.6 mg/dL (ref 8.6–10.3)
Chloride: 110 mmol/L (ref 98–110)
GFR, Est Non African American: >89 mL/min (ref >=60)
GLUCOSE: 96 mg/dL (ref 65–99)
Potassium: 4.1 mmol/L (ref 3.5–5.3)
SODIUM: 143 mmol/L (ref 135–146)
TOTAL PROTEIN: 8 g/dL (ref 6.1–8.1)

## 2016-07-08 NOTE — Progress Notes (Signed)
RFV: follow up for ltbi and now hep C +  Patient ID: [name], male   DOB: 11/18/1960, 55 y.o.   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 He is referred back to clinic for management of hepatitis C. Roughly 6 months ago, he was tested for hep C found to be antibody positive. He denies any hx of ivdu, sex with ivdu, has tattoos but none homemade. He denies any episodes of having jaundice. No significant drinking of alcohol or taking acetominophen.  He is hep B core + isolated, hep B surface ag negative, hep b surface ab negative, hep C ab +   Outpatient Encounter Prescriptions as of 07/08/2016  Medication Sig  . baclofen (LIORESAL) 20 MG tablet Take 20 mg by mouth 4 (four) times daily as needed for muscle spasms.   . Cefixime (SUPRAX) 400 MG CAPS capsule Take 1 capsule (400 mg total) by mouth daily.  [name]. dalfampridine (AMPYRA) 10 MG TB12 Take 10 mg by mouth 2 (two) times daily.  [name]. enoxaparin (LOVENOX) 120 MG/0.8ML injection Inject 0.8 mLs (120 mg total) into the skin daily.  . Eszopiclone (ESZOPICLONE) 3 MG TABS Take 3 mg by mouth at bedtime as needed (for sleep).   . gabapentin (NEURONTIN) 600 MG tablet Take 600 mg by mouth 4 (four) times daily.   [name]. levETIRAcetam (KEPPRA) 500 MG tablet Take 500 mg by mouth 2 (two) times daily.  [name]. lisinopril-hydrochlorothiazide (PRINZIDE,ZESTORETIC) 10-12.5 MG per tablet Take 1 tablet by mouth daily.  . ondansetron (ZOFRAN ODT) 8 MG disintegrating tablet Take 1 tablet (8 mg total) by mouth every 8 (eight) hours as needed for nausea or vomiting.  . Oxcarbazepine (TRILEPTAL) 300 MG tablet Take 300 mg by mouth 3 (three) times daily.  [name]. oxyCODONE (OXY IR/ROXICODONE) 5 MG immediate release tablet Take 5 mg by mouth every 6 (six) hours as needed (For pain.).   [name]. oxyCODONE-acetaminophen (PERCOCET) 10-325 MG tablet Take 1-2 tablets by mouth every 6 (six) hours as  needed for pain.   [name]. sertraline (ZOLOFT) 50 MG tablet Take 50 mg by mouth daily.   [name]. topiramate (TOPAMAX) 50 MG tablet Take 50 mg by mouth 2 (two) times daily.  . verapamil (CALAN) 80 MG tablet Take 0.5 tablets (40 mg total) by mouth 2 (two) times daily.  . [DISCONTINUED] rifampin (RIFADIN) 300 MG capsule Take 1 capsule (300 mg total) by mouth 2 (two) times daily.   Facility-Administered Encounter Medications as of 07/08/2016  Medication  . sodium chloride 0.9 % injection 10 mL     Patient Active Problem List   Diagnosis Date Noted  . Chronic cluster headache, not intractable   . Community acquired pneumonia   . TB lung, latent   . HCAP (healthcare-associated pneumonia) 02/16/2016  . Hypokalemia 02/16/2016  . Cluster headache   . DVT (deep venous thrombosis) (HCC)   . Depression   . GERD (gastroesophageal reflux disease)   . HTN (hypertension)   . Essential hypertension   . Gastroesophageal reflux disease without esophagitis   . CAP (community acquired pneumonia)   . Multiple sclerosis (HCC) 08/02/2013  . ABDOMINAL BLOATING 10/14/2010  . LOOSE STOOLS 10/14/2010  . PULMONARY EMBOLISM, HX OF 10/14/2010     Health Maintenance Due  Topic Date Due  . TETANUS/TDAP  11/12/1979  . INFLUENZA VACCINE  06/10/2016     Review of Systems 10 point ros  has been reviewed and not contributing to his current presentation Physical Exam   BP 109/69   Pulse 63   Temp 97.5 F (36.4 C) (Oral)   Wt 196 lb (88.9 kg)   BMI 28.94 kg/m  Physical Exam  Constitutional: He is oriented to person, place, and time. He appears chronically ill, wheelchair bound. No distress.  HENT:  Mouth/Throat: Oropharynx is clear and moist. No oropharyngeal exudate.  Cardiovascular: Normal rate, regular rhythm and normal heart sounds. Exam reveals no gallop and no friction rub.  No murmur heard.  Pulmonary/Chest: Effort normal and breath sounds normal. No respiratory distress. He has no wheezes.  Abdominal:  Soft. Bowel sounds are normal. He exhibits no distension. There is no tenderness.  Lymphadenopathy:  He has no cervical adenopathy.  Neurological: He is alert and oriented to person, place, and time. Lower extremity weakness from MS Skin: Skin is warm and dry. No rash noted. No erythema.  Psychiatric: He has a normal mood and affect. His behavior is normal.     CBC Lab Results  Component Value Date   WBC 12.4 (H) 02/16/2016   RBC 4.46 02/16/2016   HGB 12.6 (L) 02/16/2016   HCT 37.0 (L) 02/16/2016   PLT 236 02/16/2016   MCV 82.3 02/16/2016   MCH 26.9 02/16/2016   MCHC 32.7 02/16/2016   RDW 15.8 (H) 02/16/2016   LYMPHSABS 5.5 (H) 02/16/2016   MONOABS 0.9 02/16/2016   EOSABS 0.2 02/16/2016   BASOSABS 0.0 02/16/2016   BMET Lab Results  Component Value Date   NA 142 04/22/2016   K 4.4 04/22/2016   CL 105 04/22/2016   CO2 27 04/22/2016   GLUCOSE 92 04/22/2016   BUN 7 04/22/2016   CREATININE 0.84 04/22/2016   CALCIUM 9.4 04/22/2016   GFRNONAA >89 04/22/2016   GFRAA >89 04/22/2016     Assessment and Plan  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 Hepatitis A ab to see if he needs to be vaccinated  Chronic hepatitis b =will check hepatitis b viral load to see if meets criteria for treatment as well  MS = we will need to have careful evaluation of all his medications so that there is no drug interaction with hep c treatment

## 2016-07-09 LAB — HEPATITIS A ANTIBODY, TOTAL: Hep A Total Ab: NONREACTIVE

## 2016-07-09 LAB — HEPATITIS C RNA QUANTITATIVE
HCV Quantitative Log: 6.9 {Log} — ABNORMAL HIGH (ref <1.18)
HCV Quantitative: 7957464 IU/mL — ABNORMAL HIGH (ref <15)

## 2016-07-09 LAB — HEPATITIS C ANTIBODY: HCV Ab: REACTIVE — AB

## 2016-07-09 LAB — HCV RNA QUANT RFLX ULTRA OR GENOTYP
HCV Quantitative Log: 6.9 {Log} — ABNORMAL HIGH (ref <1.18)
HCV Quantitative: 7957464 IU/mL — ABNORMAL HIGH (ref <15)

## 2016-07-10 LAB — HEPATITIS B DNA, ULTRAQUANTITATIVE, PCR
Hepatitis B DNA (Calc): <1.3 Log IU/mL (ref <1.30)
Hepatitis B DNA: <20 IU/mL (ref <20)

## 2016-07-11 ENCOUNTER — Inpatient Hospital Stay: Payer: Medicare Other

## 2016-07-15 LAB — HEPATITIS C GENOTYPE

## 2016-07-21 ENCOUNTER — Telehealth: Payer: Self-pay | Admitting: *Deleted

## 2016-07-21 NOTE — Telephone Encounter (Signed)
Last office note requested from [facility]. Note faxed

## 2016-07-22 ENCOUNTER — Inpatient Hospital Stay: Payer: Medicare Other | Attending: Oncology

## 2016-07-22 VITALS — BP 119/76 | HR 58 | Resp 18

## 2016-07-22 DIAGNOSIS — Z79899 Other long term (current) drug therapy: Secondary | ICD-10-CM | POA: Diagnosis not present

## 2016-07-22 DIAGNOSIS — G35 Multiple sclerosis: Secondary | ICD-10-CM | POA: Diagnosis not present

## 2016-07-22 MED ORDER — SODIUM CHLORIDE 0.9 % IV SOLN
Freq: Once | INTRAVENOUS | Status: AC
  Administered 2016-07-22: 10:00:00 via INTRAVENOUS
  Filled 2016-07-22: qty 1000

## 2016-07-22 MED ORDER — HEPARIN SOD (PORK) LOCK FLUSH 100 UNIT/ML IV SOLN
500.0000 [IU] | Freq: Once | INTRAVENOUS | Status: AC
  Administered 2016-07-22: 500 [IU] via INTRAVENOUS
  Filled 2016-07-22: qty 5

## 2016-07-22 MED ORDER — SODIUM CHLORIDE 0.9% FLUSH
10.0000 mL | INTRAVENOUS | Status: DC | PRN
Start: 2016-07-22 — End: 2016-07-22
  Administered 2016-07-22: 10 mL via INTRAVENOUS
  Filled 2016-07-22: qty 10

## 2016-07-22 MED ORDER — ACETAMINOPHEN 500 MG PO TABS
1000.0000 mg | ORAL_TABLET | Freq: Once | ORAL | Status: AC
  Administered 2016-07-22: 1000 mg via ORAL
  Filled 2016-07-22: qty 2

## 2016-07-22 MED ORDER — SODIUM CHLORIDE 0.9 % IV SOLN
300.0000 mg | Freq: Once | INTRAVENOUS | Status: AC
  Administered 2016-07-22: 300 mg via INTRAVENOUS
  Filled 2016-07-22: qty 15

## 2016-07-29 ENCOUNTER — Inpatient Hospital Stay: Payer: Medicare Other

## 2016-08-08 ENCOUNTER — Inpatient Hospital Stay: Payer: Medicare Other

## 2016-08-19 ENCOUNTER — Inpatient Hospital Stay: Payer: Medicare HMO | Attending: Oncology

## 2016-08-19 ENCOUNTER — Telehealth: Payer: Self-pay | Admitting: *Deleted

## 2016-08-19 VITALS — BP 102/69 | HR 84 | Temp 98.5°F | Resp 18

## 2016-08-19 DIAGNOSIS — Z79899 Other long term (current) drug therapy: Secondary | ICD-10-CM | POA: Diagnosis not present

## 2016-08-19 DIAGNOSIS — G35 Multiple sclerosis: Secondary | ICD-10-CM | POA: Diagnosis present

## 2016-08-19 MED ORDER — ACETAMINOPHEN 500 MG PO TABS
1000.0000 mg | ORAL_TABLET | Freq: Once | ORAL | Status: AC
  Administered 2016-08-19: 1000 mg via ORAL
  Filled 2016-08-19: qty 2

## 2016-08-19 MED ORDER — HEPARIN SOD (PORK) LOCK FLUSH 100 UNIT/ML IV SOLN
500.0000 [IU] | Freq: Once | INTRAVENOUS | Status: AC
  Administered 2016-08-19: 500 [IU] via INTRAVENOUS

## 2016-08-19 MED ORDER — SODIUM CHLORIDE 0.9 % IV SOLN
300.0000 mg | Freq: Once | INTRAVENOUS | Status: AC
  Administered 2016-08-19: 300 mg via INTRAVENOUS
  Filled 2016-08-19: qty 15

## 2016-08-19 MED ORDER — SODIUM CHLORIDE 0.9 % IV SOLN
Freq: Once | INTRAVENOUS | Status: DC
  Filled 2016-08-19: qty 1000

## 2016-08-19 MED ORDER — SODIUM CHLORIDE 0.9% FLUSH
10.0000 mL | INTRAVENOUS | Status: DC | PRN
  Administered 2016-08-19: 10 mL via INTRAVENOUS
  Filled 2016-08-19: qty 10

## 2016-08-19 NOTE — Telephone Encounter (Signed)
Patient called for the results of his hep C and TB. He said this office was very unprofessional because this office and Dr. Reva Bores office (Neuro) never called him about his lab results. [facility], pharmacist and he will reach out to the patient. He first said he didn't know he still had hepatitis C, but he then said that Dr. Tinnie Gens has already told him he had it.

## 2016-08-21 ENCOUNTER — Telehealth: Payer: Self-pay | Admitting: Pharmacist Clinician (PhC)/ Clinical Pharmacy Specialist

## 2016-08-21 NOTE — Telephone Encounter (Signed)
Spoke with Dr. Tinnie Gens (neurologist) the other day about the issue with Trileptal that he is currently on and the interaction with any of the hep C meds. Dr. Tinnie Gens stated that he'll try to handle it. Relay the message back to [facility] today. He needs to be off of it first before treating his hep c.

## 2016-08-26 ENCOUNTER — Inpatient Hospital Stay: Payer: Medicare HMO

## 2016-08-26 NOTE — Telephone Encounter (Signed)
Again, I am confused, since his hepatitis C was discussed in person with him at our last appt. I will discuss with minh.

## 2016-08-26 NOTE — Telephone Encounter (Signed)
[name], I am a little confused. [name] was under our care for treatment of latent Tb. After we treated his LTBI, we also discussed that we could treat his Hep C on our last visit. Did he want [place] to discuss this with him.

## 2016-08-26 NOTE — Telephone Encounter (Signed)
Sorry Dr. Drue SecondSnider, that was a very confusing call overall; which is why I just routed the conversation as it was said. His biggest complaint (and he did sound very confused) was that he did not receive a call on his results; which he appeared to be expecting shortly after he was seen.

## 2016-09-03 ENCOUNTER — Other Ambulatory Visit: Payer: Self-pay | Admitting: Psychiatry

## 2016-09-03 ENCOUNTER — Other Ambulatory Visit: Payer: Self-pay | Admitting: Pharmacist

## 2016-09-03 DIAGNOSIS — G35 Multiple sclerosis: Secondary | ICD-10-CM

## 2016-09-03 DIAGNOSIS — B182 Chronic viral hepatitis C: Secondary | ICD-10-CM

## 2016-09-05 ENCOUNTER — Inpatient Hospital Stay: Payer: Medicare Other

## 2016-09-05 ENCOUNTER — Other Ambulatory Visit: Payer: Medicare HMO

## 2016-09-05 DIAGNOSIS — B182 Chronic viral hepatitis C: Secondary | ICD-10-CM

## 2016-09-09 LAB — LIVER FIBROSIS, FIBROTEST-ACTITEST
ALPHA-2-MACROGLOBULIN: 399 mg/dL — AB (ref 106–279)
ALT: 40 U/L (ref 9–46)
APOLIPOPROTEIN A1: 132 mg/dL (ref 94–176)
BILIRUBIN: 0.3 mg/dL (ref 0.2–1.2)
FIBROSIS SCORE: 0.81
GGT: 41 U/L (ref 3–85)
Haptoglobin: 15 mg/dL — ABNORMAL LOW (ref 43–212)
NECROINFLAMMAT ACT SCORE: 0.38
REFERENCE ID: 1672945

## 2016-09-11 ENCOUNTER — Other Ambulatory Visit: Payer: Self-pay | Admitting: Pharmacist

## 2016-09-11 ENCOUNTER — Other Ambulatory Visit: Payer: Self-pay | Admitting: Internal Medicine

## 2016-09-11 ENCOUNTER — Encounter: Payer: Self-pay | Admitting: Pharmacist

## 2016-09-11 DIAGNOSIS — R1011 Right upper quadrant pain: Secondary | ICD-10-CM

## 2016-09-11 DIAGNOSIS — R1013 Epigastric pain: Secondary | ICD-10-CM

## 2016-09-11 DIAGNOSIS — R6881 Early satiety: Secondary | ICD-10-CM

## 2016-09-11 MED ORDER — SOFOSBUVIR-VELPATASVIR 400-100 MG PO TABS: 1.0000 | ORAL_TABLET | Freq: Every day | ORAL | 2 refills | Status: DC

## 2016-09-11 NOTE — Progress Notes (Signed)
Patient ID: [name], male   DOB: Mar 22, 1961, 55 y.o.   MRN: [id number]   [name] is now off his Trileptal x 2 weeks. He has genotype 2b, so he would either need Mavyret or Epclusa. His verapamil interacts with Mavyret, so I sent in an Rx to [facility] for Epclusa x 12 weeks. He is F4 - compensated, child pugh class A.

## 2016-09-15 ENCOUNTER — Encounter: Payer: Self-pay | Admitting: Pharmacy Technician

## 2016-09-15 MED FILL — EPCLUSA 400 MG-100 MG TAB: 400-100 | 28 days supply | Qty: 28 | Fill #0

## 2016-09-16 ENCOUNTER — Inpatient Hospital Stay: Payer: Medicare HMO

## 2016-09-16 ENCOUNTER — Ambulatory Visit: Payer: Medicaid Other | Admitting: Internal Medicine

## 2016-09-16 ENCOUNTER — Ambulatory Visit: Payer: Medicare Other

## 2016-09-17 ENCOUNTER — Ambulatory Visit: Payer: Medicare Other

## 2016-09-17 ENCOUNTER — Other Ambulatory Visit: Payer: Medicare Other

## 2016-09-18 ENCOUNTER — Inpatient Hospital Stay: Payer: Medicare HMO | Attending: Oncology

## 2016-09-18 VITALS — BP 103/65 | HR 70 | Temp 97.5°F | Resp 18

## 2016-09-18 DIAGNOSIS — Z79899 Other long term (current) drug therapy: Secondary | ICD-10-CM | POA: Insufficient documentation

## 2016-09-18 DIAGNOSIS — G35 Multiple sclerosis: Secondary | ICD-10-CM | POA: Diagnosis present

## 2016-09-18 MED ORDER — ACETAMINOPHEN 500 MG PO TABS
1000.0000 mg | ORAL_TABLET | Freq: Once | ORAL | Status: AC
  Administered 2016-09-18: 1000 mg via ORAL
  Filled 2016-09-18: qty 2

## 2016-09-18 MED ORDER — SODIUM CHLORIDE 0.9 % IV SOLN
300.0000 mg | Freq: Once | INTRAVENOUS | Status: AC
  Administered 2016-09-18: 300 mg via INTRAVENOUS
  Filled 2016-09-18: qty 15

## 2016-09-18 MED ORDER — HEPARIN SOD (PORK) LOCK FLUSH 100 UNIT/ML IV SOLN
500.0000 [IU] | Freq: Once | INTRAVENOUS | Status: AC
  Administered 2016-09-18: 500 [IU]
  Filled 2016-09-18: qty 5

## 2016-09-18 MED ORDER — SODIUM CHLORIDE 0.9 % IJ SOLN
10.0000 mL | Freq: Once | INTRAMUSCULAR | Status: AC
  Administered 2016-09-18: 10 mL
  Filled 2016-09-18: qty 10

## 2016-09-18 MED ORDER — SODIUM CHLORIDE 0.9 % IV SOLN
Freq: Once | INTRAVENOUS | Status: AC
  Administered 2016-09-18: 10:00:00 via INTRAVENOUS
  Filled 2016-09-18: qty 1000

## 2016-09-19 ENCOUNTER — Ambulatory Visit: Payer: Medicare Other

## 2016-09-22 ENCOUNTER — Encounter: Payer: Self-pay | Admitting: Pharmacy Technician

## 2016-09-22 ENCOUNTER — Ambulatory Visit: Payer: Medicare HMO | Admitting: Pharmacist

## 2016-09-22 DIAGNOSIS — B182 Chronic viral hepatitis C: Secondary | ICD-10-CM

## 2016-09-22 NOTE — Progress Notes (Signed)
HPI: [name] is a 55 y.o. male who presents to the [facility] pharmacy clinic today to initiate Hep C medications.   Lab Results  Component Value Date   HCVGENOTYPE 2b 07/08/2016    Allergies: No Known Allergies  Past Medical History: Past Medical History:  Diagnosis Date  . Abdominal pain, unspecified site   . Anxiety   . Arthritis   . Benign paroxysmal positional vertigo   . Chronic back pain   . Chronic pain syndrome 01/25/2008  . Cluster headache   . Depression    takes Zoloft daily  . DVT (deep venous thrombosis) (HCC)    in the left arm '09  . Gait abnormality    "uses mobile wheelchair, but is ambulatory"  . Gallstones 02/17/2009   resolved after gallbladder surgery.  [name]. GERD (gastroesophageal reflux disease)    takes Omeprazole as needed  . Headache(784.0)    cluster headaches frequently-takes Topamax daily  . History of colonoscopy   . HTN (hypertension)    takes Lisinopril,Verapamil,and Triamterene HCTZ daily  . Insomnia 11/06/2008  . Joint pain   . Joint swelling    03-07-16 "swelling of right wrist" "after a fall-xray done 03-06-16 "no fractures".  . Memory loss    no an issue at present 03-07-16  . Multiple sclerosis (HCC)    Dx. 2005 - Dr. Tinnie GensJeffrey follows LOV 4'17 tx. Tysabri monthly IV-[facility] , [place].  [name]. Nonspecific elevation of levels of transaminase or lactic acid dehydrogenase (LDH)   . Other specified visual disturbances   . Other syndromes affecting cervical region   . Pneumonia 2009  . Trigeminal neuralgia     history" Multiple sclerosis"    Social History: Social History   Social History  . Marital status: Married    Spouse name: N/A  . Number of children: 3  . Years of education: 5711 th   Occupational History  .      Disabled   Social History Main Topics  . Smoking status: Current Every Day Smoker    Packs/day: 1.00    Years: 38.00    Types: Cigarettes  . Smokeless tobacco: Never Used     Comment: cutting  back  . Alcohol use 0.0 oz/week     Comment: occasional  . Drug use: No     Comment: Quit 2011  . Sexual activity: Not on file   Other Topics Concern  . Not on file   Social History Narrative   Patient is divorced. Patient is disabled. Because of his MS. Patient has 11 th grade education. 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    Right handed.   Caffeine- One daily.    Labs: Hepatitis B Surface Ag (no units)  Date Value  05/28/2016 Negative   HCV Ab (no units)  Date Value  07/08/2016 REACTIVE (A)    Lab Results  Component Value Date   HCVGENOTYPE 2b 07/08/2016    Hepatitis C RNA quantitative Latest Ref Rng & Units 07/08/2016 07/08/2016  HCV Quantitative <15 IU/mL 1,914,782(N7,957,464(H) 5,621,308(M7,957,464(H)  HCV Quantitative Log <1.18 log 10 6.90(H) 6.90(H)    AST (U/L)  Date Value  07/08/2016 23  04/22/2016 83 (H)  02/04/2016 25   ALT (U/L)  Date Value  09/05/2016 40  07/08/2016 46  04/22/2016 87 (H)  02/04/2016 27   INR (no units)  Date Value  02/17/2016 2.07 (H)  02/16/2016 2.06 (H)  02/16/2016 1.95 (H)    CrCl:  CrCl cannot be calculated (Patient's most recent lab result is older than the maximum 21 days allowed.).  Fibrosis Score: F4 as assessed by fibroscore   Child-Pugh Score: A  Previous Treatment Regimen: none  Assessment: French comes in today to pick up his Epclusa for his Hep C. He called me around a month ago and told me he stopped his Trileptal, but tells me today that he took his last pill last Monday 11/6. I told him to wait one more week to allow the medication to get out of his system before starting Epclusa.  He will start his Epclusa next Monday 11/20. I will make a f/u appointment with [place] for 2 weeks after that. I counseled him on Epclusa and emphasized the importance of compliance.  I told him to call the clinic if he has any issues.   Plans: - Epclusa x 12 weeks, start next Monday 11/20 - F/u with [facility] pharmacist  12/4 at 2pm   Cassie L. Paulino Door, PharmD Infectious Diseases Clinical Pharmacist [facility] 09/22/2016, 2:00 PM

## 2016-09-23 ENCOUNTER — Inpatient Hospital Stay: Admission: RE | Admit: 2016-09-23 | Payer: Medicare Other | Source: Ambulatory Visit

## 2016-09-23 ENCOUNTER — Inpatient Hospital Stay: Payer: Medicare HMO

## 2016-09-23 ENCOUNTER — Other Ambulatory Visit: Payer: Medicare Other

## 2016-09-30 ENCOUNTER — Ambulatory Visit: Payer: Medicare Other

## 2016-10-04 ENCOUNTER — Inpatient Hospital Stay: Admission: RE | Admit: 2016-10-04 | Payer: Medicare Other | Source: Ambulatory Visit

## 2016-10-10 ENCOUNTER — Inpatient Hospital Stay: Payer: Medicare Other

## 2016-10-10 MED FILL — EPCLUSA 400 MG-100 MG TAB: 400-100 | 28 days supply | Qty: 28 | Fill #1

## 2016-10-13 ENCOUNTER — Ambulatory Visit: Payer: Medicare Other

## 2016-10-14 ENCOUNTER — Other Ambulatory Visit: Payer: Self-pay | Admitting: Internal Medicine

## 2016-10-14 ENCOUNTER — Inpatient Hospital Stay: Payer: Medicare HMO

## 2016-10-14 ENCOUNTER — Ambulatory Visit
Admission: RE | Admit: 2016-10-14 | Discharge: 2016-10-14 | Disposition: A | Discharge status: Home / Self Care | Payer: Medicare HMO | Source: Ambulatory Visit | Attending: Psychiatry | Admitting: Psychiatry

## 2016-10-14 ENCOUNTER — Other Ambulatory Visit: Payer: Self-pay | Admitting: Psychiatry

## 2016-10-14 ENCOUNTER — Ambulatory Visit
Admission: RE | Admit: 2016-10-14 | Discharge: 2016-10-14 | Disposition: A | Discharge status: Home / Self Care | Payer: Medicare HMO | Source: Ambulatory Visit | Attending: Internal Medicine | Admitting: Internal Medicine

## 2016-10-14 DIAGNOSIS — R1011 Right upper quadrant pain: Secondary | ICD-10-CM

## 2016-10-14 DIAGNOSIS — R1013 Epigastric pain: Secondary | ICD-10-CM

## 2016-10-14 DIAGNOSIS — G35 Multiple sclerosis: Secondary | ICD-10-CM

## 2016-10-14 DIAGNOSIS — R6881 Early satiety: Secondary | ICD-10-CM

## 2016-10-15 NOTE — Progress Notes (Signed)
[facility]  Telephone:(336) [phone] Fax:(336) [phone]  ID: [name] OB: May 11, 1961  MR#: [id number]  AOZ#:[id number]  Patient Care Team: Fleet Contras, MD as PCP - General (Internal Medicine)  CHIEF COMPLAINT:  Tysabri infusions for MS  INTERVAL HISTORY: Patient returns to clinic today for routine 6 month evaluation. He continues to tolerate Tysabri infusions without side effects. Currently, he feels well and is at his baseline.  He has no new neurologic complaints.  He denies any recent fevers.  He has a good appetite and denies weight loss.  He has no chest pain or shortness of breath.  He denies any nausea, vomiting, constipation, or diarrhea.  He has no urinary complaints.  Patient offers no specific complaints today.  REVIEW OF SYSTEMS:   Review of Systems  Constitutional: Negative.  Negative for fever, malaise/fatigue and weight loss.  Respiratory: Negative.  Negative for cough and shortness of breath.   Cardiovascular: Negative.  Negative for chest pain.  Gastrointestinal: Negative.  Negative for nausea and vomiting.  Genitourinary: Negative.   Musculoskeletal: Negative.   Neurological: Negative.  Negative for weakness.  Psychiatric/Behavioral: Negative.     As per HPI. Otherwise, a complete review of systems is negative.  PAST MEDICAL HISTORY: Past Medical History:  Diagnosis Date  . Abdominal pain, unspecified site   . Anxiety   . Arthritis   . Benign paroxysmal positional vertigo   . Chronic back pain   . Chronic pain syndrome 01/25/2008  . Cluster headache   . Depression    takes Zoloft daily  . DVT (deep venous thrombosis) (HCC)    in the left arm '09  . Gait abnormality    "uses mobile wheelchair, but is ambulatory"  . Gallstones 02/17/2009   resolved after gallbladder surgery.  [name] GERD (gastroesophageal reflux disease)    takes Omeprazole as needed  . Headache(784.0)    cluster headaches frequently-takes Topamax daily  . History  of colonoscopy   . HTN (hypertension)    takes Lisinopril,Verapamil,and Triamterene HCTZ daily  . Insomnia 11/06/2008  . Joint pain   . Joint swelling    03-07-16 "swelling of right wrist" "after a fall-xray done 03-06-16 "no fractures".  . Memory loss    no an issue at present 03-07-16  . Multiple sclerosis (HCC)    Dx. 2005 - Dr. Tinnie Gens follows LOV 4'17 tx. Tysabri monthly IV-[facility] , [place].  [name] Nonspecific elevation of levels of transaminase or lactic acid dehydrogenase (LDH)   . Other specified visual disturbances   . Other syndromes affecting cervical region   . Pneumonia 2009  . Trigeminal neuralgia     history" Multiple sclerosis"    PAST SURGICAL HISTORY: Past Surgical History:  Procedure Laterality Date  . CHOLECYSTECTOMY  02/20/2009  . COLONOSCOPY WITH PROPOFOL N/A 03/17/2016   Procedure: COLONOSCOPY WITH PROPOFOL;  Surgeon: [name], MD;  Location: [facility];  Service: Endoscopy;  Laterality: N/A;  . PORT A CATH REVISION N/A 07/06/2015   Procedure: Removal and replacement of PORT A CATH;  Surgeon: [name], MD;  Location: [facility] OR;  Service: General;  Laterality: N/A;  . PORTACATH PLACEMENT N/A 03/27/2014   Procedure: INSERTION PORT-A-CATH;  Surgeon: [name], MD;  Location: [facility] OR;  Service: General;  Laterality: N/A;    FAMILY HISTORY Family History  Problem Relation Age of Onset  . Cancer Father   . Diabetes Mother        ADVANCED DIRECTIVES:    HEALTH MAINTENANCE: Social  History  Substance Use Topics  . Smoking status: Current Every Day Smoker    Packs/day: 1.00    Years: 38.00    Types: Cigarettes  . Smokeless tobacco: Never Used     Comment: cutting back  . Alcohol use 0.0 oz/week     Comment: occasional     Colonoscopy:  PAP:  Bone density:  Lipid panel:  No Known Allergies  Current Outpatient Prescriptions  Medication Sig Dispense Refill  . baclofen (LIORESAL) 20 MG tablet Take 20 mg by mouth 4 (four)  times daily as needed for muscle spasms.     . Cefixime (SUPRAX) 400 MG CAPS capsule Take 1 capsule (400 mg total) by mouth daily. 6 capsule 0  . dalfampridine (AMPYRA) 10 MG TB12 Take 10 mg by mouth 2 (two) times daily.    [name] enoxaparin (LOVENOX) 120 MG/0.8ML injection Inject 0.8 mLs (120 mg total) into the skin daily. 30 Syringe 3  . Eszopiclone (ESZOPICLONE) 3 MG TABS Take 3 mg by mouth at bedtime as needed (for sleep).     . gabapentin (NEURONTIN) 600 MG tablet Take 600 mg by mouth 4 (four) times daily.     [name] levETIRAcetam (KEPPRA) 500 MG tablet Take 500 mg by mouth 2 (two) times daily.  5  . lisinopril-hydrochlorothiazide (PRINZIDE,ZESTORETIC) 10-12.5 MG per tablet Take 1 tablet by mouth daily.    . ondansetron (ZOFRAN ODT) 8 MG disintegrating tablet Take 1 tablet (8 mg total) by mouth every 8 (eight) hours as needed for nausea or vomiting. 60 tablet 3  . oxyCODONE (OXY IR/ROXICODONE) 5 MG immediate release tablet Take 5 mg by mouth every 6 (six) hours as needed (For pain.).     [name] oxyCODONE-acetaminophen (PERCOCET) 10-325 MG tablet Take 1-2 tablets by mouth every 6 (six) hours as needed for pain.     [name] sertraline (ZOLOFT) 50 MG tablet Take 50 mg by mouth daily.     . Sofosbuvir-Velpatasvir (EPCLUSA) 400-100 MG TABS Take 1 tablet by mouth daily. 30 tablet 2  . topiramate (TOPAMAX) 50 MG tablet Take 50 mg by mouth 2 (two) times daily.  5  . verapamil (CALAN) 80 MG tablet Take 0.5 tablets (40 mg total) by mouth 2 (two) times daily.     No current facility-administered medications for this visit.    Facility-Administered Medications Ordered in Other Visits  Medication Dose Route Frequency Provider Last Rate Last Dose  . sodium chloride 0.9 % injection 10 mL  10 mL Intracatheter PRN Loann Quill, NP   10 mL at 11/07/15 1012    OBJECTIVE: There were no vitals filed for this visit.   There is no height or weight on file to calculate BMI.    ECOG FS:1 - Symptomatic but completely  ambulatory  General: Well-developed, well-nourished, no acute distress.  Eyes: anicteric sclera. Lungs: Clear to auscultation bilaterally. Heart: Regular rate and rhythm. No rubs, murmurs, or gallops. Abdomen: Soft, nontender, nondistended. No organomegaly noted, normoactive bowel sounds. Musculoskeletal: No edema, cyanosis, or clubbing. Neuro: Alert, answering all questions appropriately. Cranial nerves grossly intact. Skin: No rashes or petechiae noted. Psych: Normal affect.   LAB RESULTS:  Lab Results  Component Value Date   NA 143 07/08/2016   K 4.1 07/08/2016   CL 110 07/08/2016   CO2 27 07/08/2016   GLUCOSE 96 07/08/2016   BUN 10 07/08/2016   CREATININE 0.87 07/08/2016   CALCIUM 9.6 07/08/2016   PROT 8.0 07/08/2016   ALBUMIN 4.3 07/08/2016   AST  23 07/08/2016   ALT 40 09/05/2016   ALKPHOS 80 07/08/2016   BILITOT 0.3 07/08/2016   GFRNONAA >89 07/08/2016   GFRAA >89 07/08/2016    Lab Results  Component Value Date   WBC 7.1 07/08/2016   NEUTROABS 2,130 07/08/2016   HGB 14.7 07/08/2016   HCT 43.0 07/08/2016   MCV 80.2 07/08/2016   PLT 218 07/08/2016     STUDIES: Mr Brain Wo Contrast  Result Date: 10/14/2016 CLINICAL DATA:  55 year old male with history multiple sclerosis recently started new medication. Subsequent encounter. EXAM: MRI HEAD WITHOUT CONTRAST TECHNIQUE: Multiplanar, multiecho pulse sequences of the brain and surrounding structures were obtained without intravenous contrast. COMPARISON:  06/05/2016. FINDINGS: Present examination was ordered with contrast. IV was not able to be obtained by nursing staff. Portions of exam are motion degraded. Brain: No acute infarct or intracranial hemorrhage. Scattered punctate and patchy white matter changes similar to prior exam consistent with patient's history of multiple sclerosis. Contrast not able to be administered as noted above. None of these areas demonstrate restricted motion to suggest acute demyelination.  No associated mass effect to suggest progressive multifocal leukoencephalopathy. Mild prominence posterior pituitary gland noted on multiple prior exams dating back to 2005. Growth arrest line of the clivus unchanged as well. Vascular: Ectatic carotid arteries. Major intracranial vascular structures are patent. Skull and upper cervical spine: No acute abnormality. Sinuses/Orbits: No acute orbital abnormality. Minimal mucosal thickening ethmoid sinus air cells. Other: Negative IMPRESSION: Present examination was ordered with contrast. IV was not able to be obtained by nursing staff. Portions of exam are motion degraded. Scattered punctate and patchy white matter changes similar to prior exam consistent with patient's history of multiple sclerosis. None of these areas demonstrate restricted motion to suggest acute demyelination. Remainder of findings unchanged. Electronically Signed   By: Lacy Duverney M.D.   On: 10/14/2016 10:45   Mr Abdomen Mrcp Wo Contrast  Result Date: 10/14/2016 CLINICAL DATA:  Chronic right upper quadrant and epigastric pain. Early satiety. Previous cholecystectomy. EXAM: MRI ABDOMEN WITHOUT CONTRAST  (INCLUDING MRCP) TECHNIQUE: Multiplanar multisequence MR imaging of the abdomen was performed. Heavily T2-weighted images of the biliary and pancreatic ducts were obtained, and three-dimensional MRCP images were rendered by post processing. Intravenous contrast could not be administered for this exam due to lack of intravenous access. COMPARISON:  CT on 05/14/2014 FINDINGS: Lower chest: No acute findings. Hepatobiliary: No masses visualized on this unenhanced exam. Previous cholecystectomy. No evidence of biliary ductal dilatation, with proximal common bile duct measuring 6 mm in diameter. No evidence of choledocholithiasis. Pancreas: No mass or inflammatory process visualized on this unenhanced exam. No evidence of pancreatic ductal dilatation or pancreas divisum. Spleen:  Within normal limits  in size. Adrenals/Urinary tract: Normal adrenal glands. Tiny bilateral renal cysts. No evidence of renal masses on this unenhanced exam. No evidence of hydronephrosis. Stomach/Bowel: No evidence of obstruction, inflammatory process, or abnormal fluid collections. Vascular/Lymphatic: No pathologically enlarged lymph nodes identified. No evidence of abdominal aortic aneurysm. Other:  None. Musculoskeletal:  No suspicious bone lesions identified. IMPRESSION: Previous cholecystectomy. No evidence of biliary ductal dilatation, choledocholithiasis, or other acute findings. Electronically Signed   By: Myles Rosenthal M.D.   On: 10/14/2016 13:57    ASSESSMENT: Tysabri infusion for multiple sclerosis.  PLAN:    1.  Tysabri infusion for multiple sclerosis: Patient will receive 300 mg Tysabri today.  He will return to clinic every 4 weeks for continuation of infusions. We will continue current treatments unless directed  by his primary neurologist, Dr. Tinnie GensJeffrey. He will return to clinic every 28 days for Tysabri and then in 6 months for routine evaluation.  Patient understands that all of his laboratory work, including JC virus, will be monitored by his primary neurologist.  He also understands that if he has any questions regarding Tysabri or his multiple sclerosis but they should be directed towards Dr. Tinnie GensJeffrey.    Patient expressed understanding and was in agreement with this plan. He also understands that He can call clinic at any time with any questions, concerns, or complaints.    Jeralyn Ruthsimothy J Deolinda Frid, MD   10/19/2016 12:56 PM

## 2016-10-16 ENCOUNTER — Inpatient Hospital Stay: Payer: Medicare HMO

## 2016-10-17 ENCOUNTER — Ambulatory Visit: Payer: Medicare Other

## 2016-10-17 ENCOUNTER — Inpatient Hospital Stay: Payer: Medicare HMO | Attending: Oncology | Admitting: Oncology

## 2016-10-17 VITALS — BP 102/75 | HR 75 | Temp 97.0°F | Resp 18

## 2016-10-17 DIAGNOSIS — I10 Essential (primary) hypertension: Secondary | ICD-10-CM | POA: Insufficient documentation

## 2016-10-17 DIAGNOSIS — G4700 Insomnia, unspecified: Secondary | ICD-10-CM | POA: Insufficient documentation

## 2016-10-17 DIAGNOSIS — F419 Anxiety disorder, unspecified: Secondary | ICD-10-CM | POA: Diagnosis not present

## 2016-10-17 DIAGNOSIS — G35 Multiple sclerosis: Secondary | ICD-10-CM

## 2016-10-17 DIAGNOSIS — F329 Major depressive disorder, single episode, unspecified: Secondary | ICD-10-CM | POA: Insufficient documentation

## 2016-10-17 DIAGNOSIS — K219 Gastro-esophageal reflux disease without esophagitis: Secondary | ICD-10-CM | POA: Diagnosis not present

## 2016-10-17 DIAGNOSIS — Z79899 Other long term (current) drug therapy: Secondary | ICD-10-CM | POA: Diagnosis not present

## 2016-10-17 DIAGNOSIS — F17210 Nicotine dependence, cigarettes, uncomplicated: Secondary | ICD-10-CM

## 2016-10-17 DIAGNOSIS — H8110 Benign paroxysmal vertigo, unspecified ear: Secondary | ICD-10-CM | POA: Insufficient documentation

## 2016-10-17 DIAGNOSIS — Z9049 Acquired absence of other specified parts of digestive tract: Secondary | ICD-10-CM | POA: Insufficient documentation

## 2016-10-17 MED ORDER — HEPARIN SOD (PORK) LOCK FLUSH 100 UNIT/ML IV SOLN
500.0000 [IU] | Freq: Once | INTRAVENOUS | Status: AC
  Administered 2016-10-17: 500 [IU]
  Filled 2016-10-17: qty 5

## 2016-10-17 MED ORDER — SODIUM CHLORIDE 0.9 % IJ SOLN
10.0000 mL | Freq: Once | INTRAMUSCULAR | Status: AC
  Administered 2016-10-17: 10 mL
  Filled 2016-10-17: qty 10

## 2016-10-17 MED ORDER — SODIUM CHLORIDE 0.9 % IV SOLN
300.0000 mg | Freq: Once | INTRAVENOUS | Status: AC
  Administered 2016-10-17: 300 mg via INTRAVENOUS
  Filled 2016-10-17: qty 15

## 2016-10-17 MED ORDER — SODIUM CHLORIDE 0.9 % IV SOLN
Freq: Once | INTRAVENOUS | Status: AC
  Administered 2016-10-17: 11:00:00 via INTRAVENOUS
  Filled 2016-10-17: qty 1000

## 2016-10-17 MED ORDER — ACETAMINOPHEN 500 MG PO TABS
1000.0000 mg | ORAL_TABLET | Freq: Once | ORAL | Status: AC
  Administered 2016-10-17: 1000 mg via ORAL
  Filled 2016-10-17: qty 2

## 2016-10-19 IMAGING — CR DG CHEST 2V
2 series · 2 of 2 positions shown · non-contrast
Comparison: 03/27/2014

CLINICAL DATA: Swelling and pain with standing, dyspnea, history
multiple sclerosis, pulmonary embolism, hypertension

EXAM:
CHEST  2 VIEW

[chest lat]
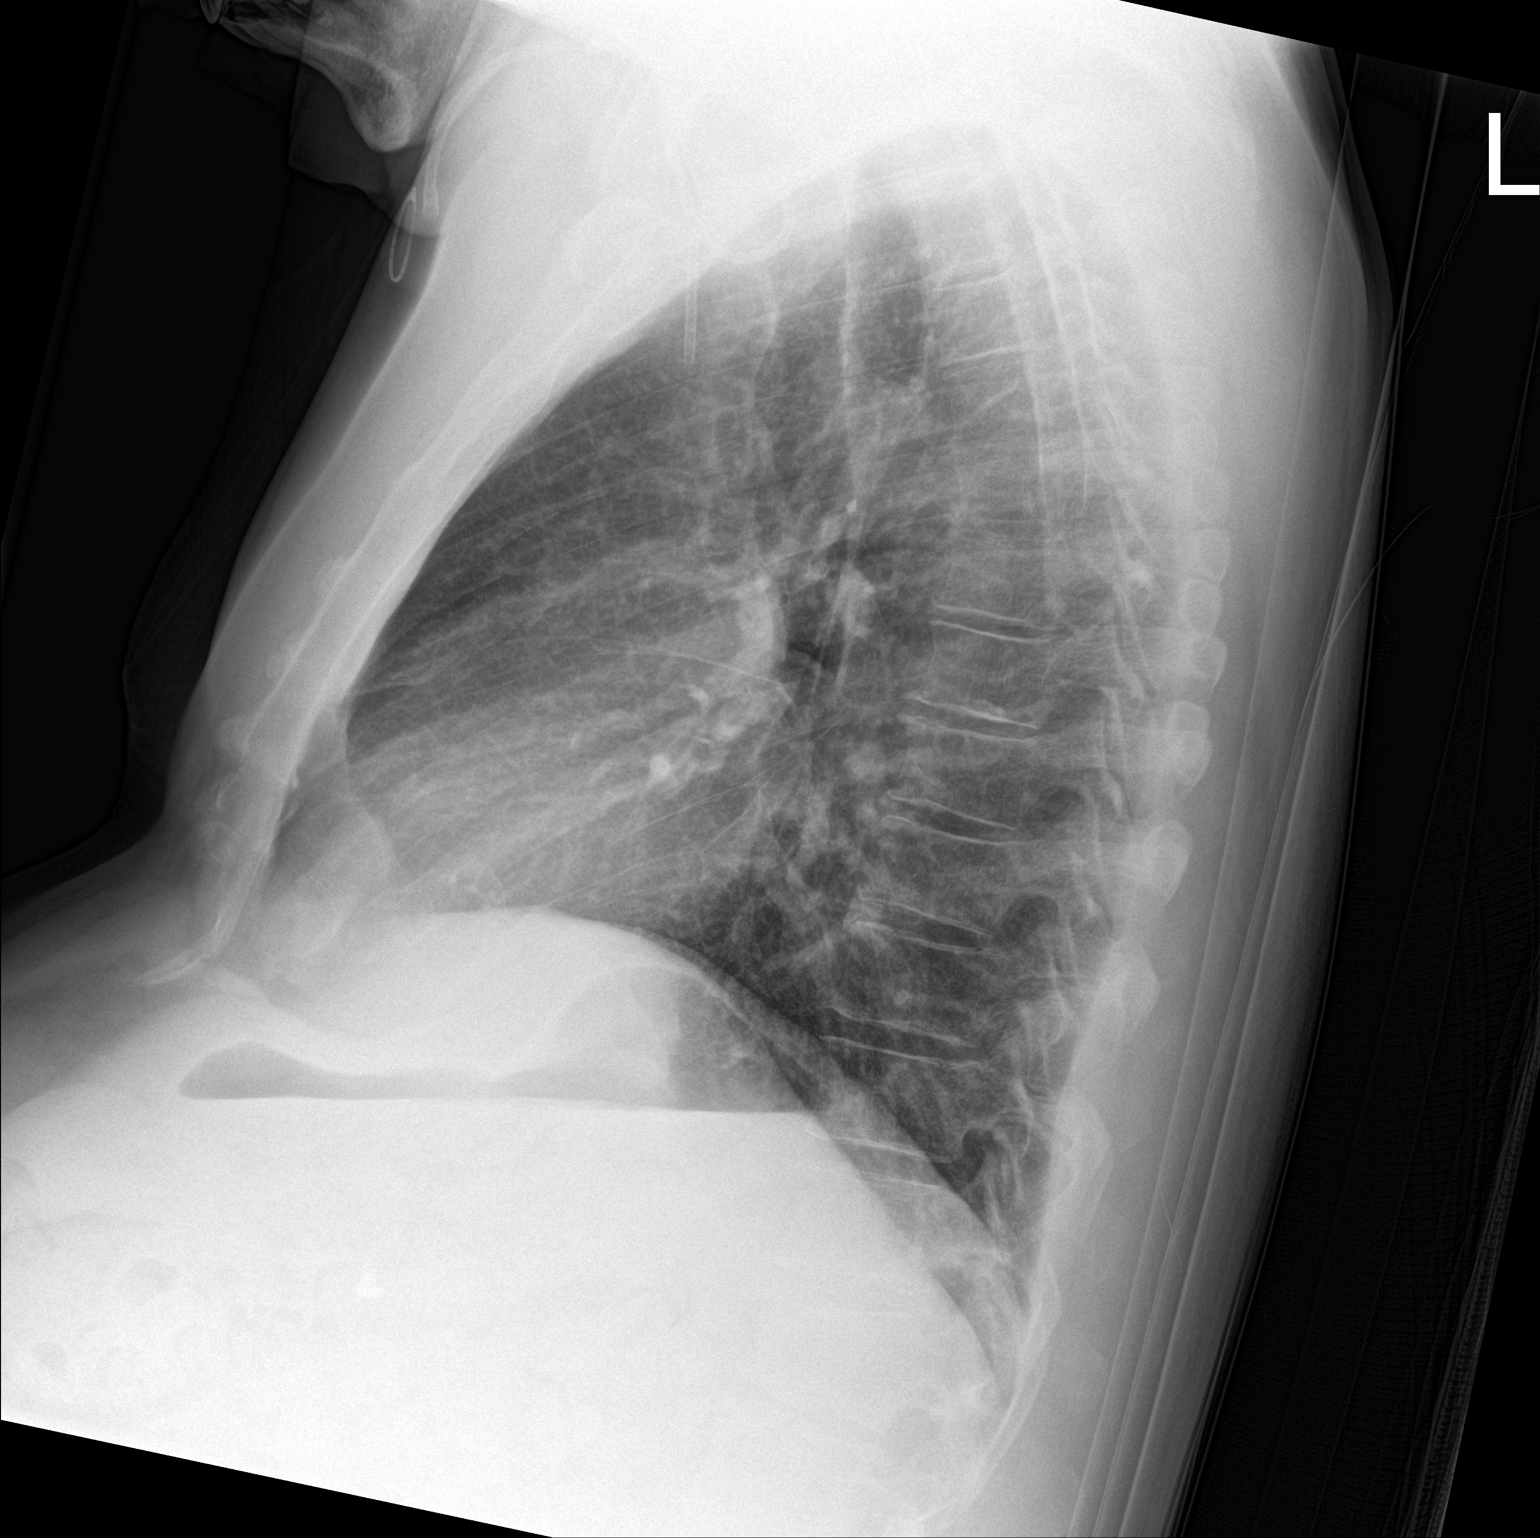

[chest ap]
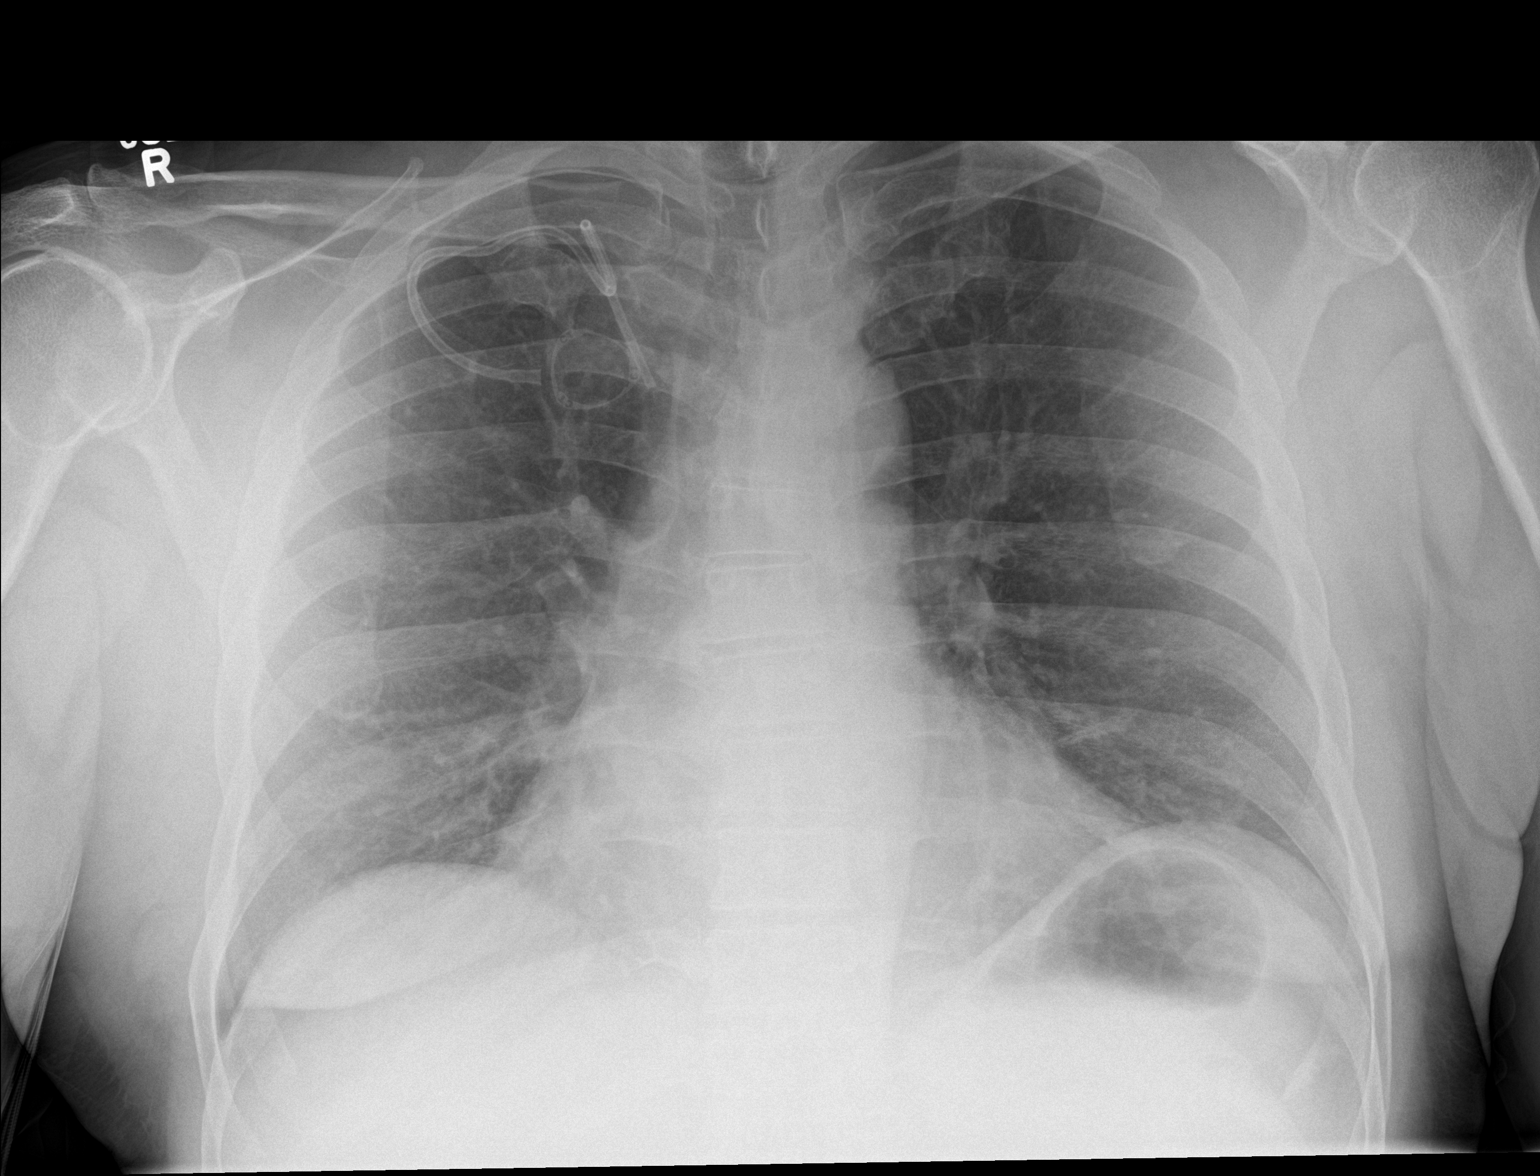

[2 of 2 positions shown; findings below may reference images not displayed]

FINDINGS: RIGHT subclavian Port-A-Cath is present.

Port-A-Cath tubing has retracted and is now partially coiled in the
RIGHT subclavian region with the tip projecting over the confluence
of the SVC.

Enlargement of cardiac silhouette.

Mediastinal contours and pulmonary vascularity normal.

Lungs clear.

No pleural effusion or pneumothorax.
IMPRESSION: The tubing of the RIGHT subclavian Port-A-Cath has retracted and is
partially coiled in the RIGHT subclavian vein though the tip
projects over the confluence of the SVC.

Enlargement of cardiac silhouette.

## 2016-10-21 ENCOUNTER — Inpatient Hospital Stay: Payer: Medicare HMO

## 2016-10-22 ENCOUNTER — Encounter (HOSPITAL_COMMUNITY): Payer: Self-pay | Admitting: *Deleted

## 2016-10-22 DIAGNOSIS — F17210 Nicotine dependence, cigarettes, uncomplicated: Secondary | ICD-10-CM | POA: Diagnosis not present

## 2016-10-22 DIAGNOSIS — Z79899 Other long term (current) drug therapy: Secondary | ICD-10-CM | POA: Diagnosis not present

## 2016-10-22 DIAGNOSIS — Z7901 Long term (current) use of anticoagulants: Secondary | ICD-10-CM | POA: Insufficient documentation

## 2016-10-22 DIAGNOSIS — I10 Essential (primary) hypertension: Secondary | ICD-10-CM | POA: Diagnosis not present

## 2016-10-22 DIAGNOSIS — R51 Headache: Secondary | ICD-10-CM | POA: Insufficient documentation

## 2016-10-22 DIAGNOSIS — M545 Low back pain: Secondary | ICD-10-CM | POA: Insufficient documentation

## 2016-10-22 NOTE — ED Triage Notes (Signed)
Pt c/o R sided frontal headache x 1 hour. Hx of cluster headaches and MS. Pt placed on oxygen by [facility], reports improvement of headache with oxygen. Also reports photophobia

## 2016-10-23 ENCOUNTER — Emergency Department (HOSPITAL_COMMUNITY)
Admission: EM | Admit: 2016-10-23 | Discharge: 2016-10-23 | Disposition: A | Discharge status: Home / Self Care | Payer: Medicare HMO | Attending: Emergency Medicine | Admitting: Emergency Medicine

## 2016-10-23 DIAGNOSIS — M545 Low back pain, unspecified: Secondary | ICD-10-CM

## 2016-10-23 DIAGNOSIS — G4489 Other headache syndrome: Secondary | ICD-10-CM

## 2016-10-23 LAB — CBC WITH DIFFERENTIAL/PLATELET
Basophils Absolute: 0 10*3/uL (ref 0.0–0.1)
Basophils Relative: 0 %
EOS PCT: 2 %
Eosinophils Absolute: 0.2 10*3/uL (ref 0.0–0.7)
HCT: 42.2 % (ref 39.0–52.0)
Hemoglobin: 13.6 g/dL (ref 13.0–17.0)
LYMPHS ABS: 5.2 10*3/uL — AB (ref 0.7–4.0)
Lymphocytes Relative: 63 %
MCH: 27.3 pg (ref 26.0–34.0)
MCHC: 32.2 g/dL (ref 30.0–36.0)
MCV: 84.6 fL (ref 78.0–100.0)
Monocytes Absolute: 0.4 10*3/uL (ref 0.1–1.0)
Monocytes Relative: 5 %
Neutro Abs: 2.5 10*3/uL (ref 1.7–7.7)
Neutrophils Relative %: 30 %
Platelets: 199 10*3/uL (ref 150–400)
RBC: 4.99 MIL/uL (ref 4.22–5.81)
RDW: 15.8 % — AB (ref 11.5–15.5)
WBC: 8.3 10*3/uL (ref 4.0–10.5)

## 2016-10-23 LAB — BASIC METABOLIC PANEL
Anion gap: 8 (ref 5–15)
BUN: 9 mg/dL (ref 6–20)
CHLORIDE: 109 mmol/L (ref 101–111)
CO2: 23 mmol/L (ref 22–32)
CREATININE: 0.73 mg/dL (ref 0.61–1.24)
Calcium: 8.8 mg/dL — ABNORMAL LOW (ref 8.9–10.3)
GFR calc Af Amer: >60 mL/min (ref >60)
Glucose, Bld: 103 mg/dL — ABNORMAL HIGH (ref 65–99)
Potassium: 4.2 mmol/L (ref 3.5–5.1)
SODIUM: 140 mmol/L (ref 135–145)

## 2016-10-23 MED ORDER — PROCHLORPERAZINE EDISYLATE 5 MG/ML IJ SOLN
10.0000 mg | Freq: Once | INTRAMUSCULAR | Status: AC
Start: 2016-10-23 — End: 2016-10-23
  Administered 2016-10-23: 10 mg via INTRAVENOUS
  Filled 2016-10-23: qty 2

## 2016-10-23 MED ORDER — KETOROLAC TROMETHAMINE 30 MG/ML IJ SOLN
30.0000 mg | Freq: Once | INTRAMUSCULAR | Status: AC
  Administered 2016-10-23: 30 mg via INTRAVENOUS
  Filled 2016-10-23: qty 1

## 2016-10-23 MED ORDER — HYDROMORPHONE HCL 2 MG/ML IJ SOLN
1.0000 mg | Freq: Once | INTRAMUSCULAR | Status: AC
  Administered 2016-10-23: 1 mg via INTRAVENOUS
  Filled 2016-10-23: qty 1

## 2016-10-23 MED ORDER — SODIUM CHLORIDE 0.9 % IV SOLN
INTRAVENOUS | Status: DC
  Administered 2016-10-23: 02:00:00 via INTRAVENOUS

## 2016-10-23 MED ORDER — HEPARIN SOD (PORK) LOCK FLUSH 100 UNIT/ML IV SOLN
500.0000 [IU] | Freq: Once | INTRAVENOUS | Status: AC
  Administered 2016-10-23: 500 [IU]
  Filled 2016-10-23: qty 5

## 2016-10-23 NOTE — ED Notes (Signed)
[facility] called for pt 

## 2016-10-23 NOTE — ED Provider Notes (Signed)
[facility]-EMERGENCY DEPT Provider Note   CSN: [id number] Arrival date & time: 10/22/16  2244  By signing my name below, I, Majel Homer, attest that this documentation has been prepared under the direction and in the presence of Mancel Bale, MD . Electronically Signed: Majel Homer, Scribe. 10/23/2016. 1:35 AM.  History   Chief Complaint Chief Complaint  Patient presents with  . Headache   The history is provided by the patient. No language interpreter was used.   HPI Comments: [name] is a 55 y.o. male with PMHx of HTN and vertigo, who presents to the Emergency Department complaining of gradually worsening, headache centralized to his forehead that began at ~9:30 PM this evening. Pt describes his pain as a "sharp wave of pain" that begins in his forehead and radiates "behind his eyes." He notes he was lying down in his bed at the onset of his pain. Pt states he was diagnosed with MS in 2005 and believes this is the cause of his "cluster headaches" that he experiences "once every two years." He notes he was placed on oxygen en route to ED by [facility] and reports improvement of his headache with oxygen. He also states associated right arm and leg weakness. He denies fever, vomiting, dizziness and blurred vision.   Past Medical History:  Diagnosis Date  . Abdominal pain, unspecified site   . Anxiety   . Arthritis   . Benign paroxysmal positional vertigo   . Chronic back pain   . Chronic pain syndrome 01/25/2008  . Cluster headache   . Depression    takes Zoloft daily  . DVT (deep venous thrombosis) (HCC)    in the left arm '09  . Gait abnormality    "uses mobile wheelchair, but is ambulatory"  . Gallstones 02/17/2009   resolved after gallbladder surgery.  [name] GERD (gastroesophageal reflux disease)    takes Omeprazole as needed  . Headache(784.0)    cluster headaches frequently-takes Topamax daily  . History of colonoscopy   . HTN (hypertension)    takes Lisinopril,Verapamil,and  Triamterene HCTZ daily  . Insomnia 11/06/2008  . Joint pain   . Joint swelling    03-07-16 "swelling of right wrist" "after a fall-xray done 03-06-16 "no fractures".  . Memory loss    no an issue at present 03-07-16  . Multiple sclerosis (HCC)    Dx. 2005 - Dr. Tinnie Gens follows LOV 4'17 tx. Tysabri monthly IV-[facility] , [place].  [name] Nonspecific elevation of levels of transaminase or lactic acid dehydrogenase (LDH)   . Other specified visual disturbances   . Other syndromes affecting cervical region   . Pneumonia 2009  . Trigeminal neuralgia     history" Multiple sclerosis"    Patient Active Problem List   Diagnosis Date Noted  . Chronic cluster headache, not intractable   . Community acquired pneumonia   . TB lung, latent   . HCAP (healthcare-associated pneumonia) 02/16/2016  . Hypokalemia 02/16/2016  . Cluster headache   . DVT (deep venous thrombosis) (HCC)   . Depression   . GERD (gastroesophageal reflux disease)   . HTN (hypertension)   . Essential hypertension   . Gastroesophageal reflux disease without esophagitis   . CAP (community acquired pneumonia)   . Multiple sclerosis (HCC) 08/02/2013  . ABDOMINAL BLOATING 10/14/2010  . LOOSE STOOLS 10/14/2010  . PULMONARY EMBOLISM, HX OF 10/14/2010    Past Surgical History:  Procedure Laterality Date  . CHOLECYSTECTOMY  02/20/2009  . COLONOSCOPY WITH PROPOFOL N/A 03/17/2016  Procedure: COLONOSCOPY WITH PROPOFOL;  Surgeon: Carman ChingJames Edwards, MD;  Location: [facility] ENDOSCOPY;  Service: Endoscopy;  Laterality: N/A;  . PORT A CATH REVISION N/A 07/06/2015   Procedure: Removal and replacement of PORT A CATH;  Surgeon: Claud KelpHaywood Ingram, MD;  [facility];  Service: General;  Laterality: N/A;  . PORTACATH PLACEMENT N/A 03/27/2014   Procedure: INSERTION PORT-A-CATH;  Surgeon: Ernestene MentionHaywood M Ingram, MD;  Location: [facility] OR;  Service: General;  Laterality: N/A;       Home Medications    Prior to Admission medications   Medication Sig  Start Date End Date Taking? Authorizing Provider  baclofen (LIORESAL) 20 MG tablet Take 20 mg by mouth 4 (four) times daily as needed for muscle spasms.    Yes Historical Provider, MD  dalfampridine (AMPYRA) 10 MG TB12 Take 10 mg by mouth 2 (two) times daily.   Yes Historical Provider, MD  Eszopiclone (ESZOPICLONE) 3 MG TABS Take 3 mg by mouth at bedtime as needed (for sleep).    Yes Historical Provider, MD  gabapentin (NEURONTIN) 600 MG tablet Take 600 mg by mouth 4 (four) times daily.    Yes Historical Provider, MD  levETIRAcetam (KEPPRA) 500 MG tablet Take 500 mg by mouth 2 (two) times daily.   Yes Historical Provider, MD  lisinopril-hydrochlorothiazide (PRINZIDE,ZESTORETIC) 10-12.5 MG per tablet Take 1 tablet by mouth daily.   Yes Historical Provider, MD  oxyCODONE-acetaminophen (PERCOCET) 10-325 MG tablet Take 1-2 tablets by mouth every 6 (six) hours as needed for pain.    Yes Historical Provider, MD  sertraline (ZOLOFT) 50 MG tablet Take 50 mg by mouth daily.    Yes Historical Provider, MD  Sofosbuvir-Velpatasvir (EPCLUSA) 400-100 MG TABS Take 1 tablet by mouth daily. 09/11/16  Yes Judyann Munsonynthia Snider, MD  topiramate (TOPAMAX) 50 MG tablet Take 50 mg by mouth 2 (two) times daily. 02/18/16  Yes Historical Provider, MD  verapamil (CALAN) 80 MG tablet Take 0.5 tablets (40 mg total) by mouth 2 (two) times daily. 02/17/16  Yes Vassie Lollarlos Madera, MD  warfarin (COUMADIN) 5 MG tablet Take 5 mg by mouth daily. 09/29/16  Yes Historical Provider, MD  Cefixime (SUPRAX) 400 MG CAPS capsule Take 1 capsule (400 mg total) by mouth daily. Patient not taking: Reported on 10/23/2016 02/17/16   Vassie Lollarlos Madera, MD  enoxaparin (LOVENOX) 120 MG/0.8ML injection Inject 0.8 mLs (120 mg total) into the skin daily. Patient not taking: Reported on 10/23/2016 03/18/16   Judyann Munsonynthia Snider, MD  ondansetron (ZOFRAN ODT) 8 MG disintegrating tablet Take 1 tablet (8 mg total) by mouth every 8 (eight) hours as needed for nausea or vomiting. Patient  not taking: Reported on 10/23/2016 03/18/16   Judyann Munsonynthia Snider, MD    Family History Family History  Problem Relation Age of Onset  . Cancer Father   . Diabetes Mother     Social History Social History  Substance Use Topics  . Smoking status: Current Every Day Smoker    Packs/day: 1.00    Years: 38.00    Types: Cigarettes  . Smokeless tobacco: Never Used     Comment: cutting back  . Alcohol use 0.0 oz/week     Comment: occasional     Allergies   Patient has no known allergies.   Review of Systems Review of Systems  Constitutional: Negative for fever.  Eyes: Negative for visual disturbance.  Gastrointestinal: Negative for vomiting.  Neurological: Positive for weakness and headaches. Negative for dizziness.   Physical Exam Updated Vital Signs BP 118/69 (BP Location:  Left Arm)   Pulse 63   Temp 97.9 F (36.6 C) (Oral)   Resp 17   SpO2 97%   Physical Exam  Constitutional: He appears well-developed and well-nourished. No distress.  HENT:  Head: Normocephalic and atraumatic.  Neck: Neck supple.  Pulmonary/Chest: Effort normal.  Musculoskeletal:  Strength in right leg is 3/5 and 5/5 in left leg.   Neurological: He is alert.  Skin: He is not diaphoretic.  Nursing note and vitals reviewed.  ED Treatments / Results  Labs (all labs ordered are listed, but only abnormal results are displayed) Labs Reviewed  BASIC METABOLIC PANEL - Abnormal; Notable for the following:       Result Value   Glucose, Bld 103 (*)    Calcium 8.8 (*)    All other components within normal limits  CBC WITH DIFFERENTIAL/PLATELET - Abnormal; Notable for the following:    RDW 15.8 (*)    Lymphs Abs 5.2 (*)    All other components within normal limits    EKG  EKG Interpretation None       Radiology No results found.  Procedures Procedures (including critical care time)  Medications Ordered in ED Medications  0.9 %  sodium chloride infusion ( Intravenous Stopped 10/23/16 0719)    prochlorperazine (COMPAZINE) injection 10 mg (10 mg Intravenous Given 10/23/16 0226)  HYDROmorphone (DILAUDID) injection 1 mg (1 mg Intravenous Given 10/23/16 0326)  HYDROmorphone (DILAUDID) injection 1 mg (1 mg Intravenous Given 10/23/16 0513)  ketorolac (TORADOL) 30 MG/ML injection 30 mg (30 mg Intravenous Given 10/23/16 0618)  heparin lock flush 100 unit/mL (500 Units Intracatheter Given 10/23/16 0719)    DIAGNOSTIC STUDIES:  Oxygen Saturation is 97% on RA, normal by my interpretation.    COORDINATION OF CARE:  1:32 AM Discussed treatment plan with pt at bedside and pt agreed to plan.  Initial Impression / Assessment and Plan / ED Course  I have reviewed the triage vital signs and the nursing notes.  Pertinent labs & imaging results that were available during my care of the patient were reviewed by me and considered in my medical decision making (see chart for details).  Clinical Course as of Oct 23 749  Thu Oct 23, 2016  0156 Narcotic database reviewed; no chronic narcotic prescriptions  [EW]  0320 Patient reports no relief from headache at this time after Compazine, treatment.  [EW]  0602 The patient states his headache is better, but now he is having left mid back pain. He has had that pain previously, and plans on seeing his PCP about it.  [EW]    Clinical Course User Index [EW] Mancel Bale, MD    Medications  0.9 %  sodium chloride infusion ( Intravenous Stopped 10/23/16 0719)  prochlorperazine (COMPAZINE) injection 10 mg (10 mg Intravenous Given 10/23/16 0226)  HYDROmorphone (DILAUDID) injection 1 mg (1 mg Intravenous Given 10/23/16 0326)  HYDROmorphone (DILAUDID) injection 1 mg (1 mg Intravenous Given 10/23/16 0513)  ketorolac (TORADOL) 30 MG/ML injection 30 mg (30 mg Intravenous Given 10/23/16 0618)  heparin lock flush 100 unit/mL (500 Units Intracatheter Given 10/23/16 0719)    Patient Vitals for the past 24 hrs:  BP Temp Temp src Pulse Resp SpO2  10/23/16  0711 - 98.5 F (36.9 C) Oral - - -  10/23/16 0700 106/70 - - 63 - 94 %  10/23/16 0645 100/73 - - 62 - 96 %  10/23/16 0630 101/69 - - (!) 59 - 94 %  10/23/16 0615 98/68 - - [name])  57 - 92 %  10/23/16 0545 105/68 - - (!) 57 - 92 %  10/23/16 0500 107/66 - - 62 - 93 %  10/23/16 0445 112/64 - - 62 - 94 %  10/23/16 0430 105/67 - - 64 - 96 %  10/23/16 0345 104/69 - - 68 - 95 %  10/23/16 0330 108/64 - - (!) 59 - 93 %  10/23/16 0300 108/63 - - (!) 59 - 96 %  10/23/16 0130 128/85 - - 63 20 96 %  10/23/16 0020 118/69 - - 63 17 97 %  10/22/16 2245 - - - - - 97 %  10/22/16 2244 126/72 97.9 F (36.6 C) Oral 85 18 100 %    At D/C- Reevaluation with update and discussion. After initial assessment and treatment, an updated evaluation reveals No change in clinical status. Findings discussed with patient, all questions answered. Humphrey Guerreiro L    Final Clinical Impressions(s) / ED Diagnoses   Final diagnoses:  Other headache syndrome  Left-sided low back pain without sciatica, unspecified chronicity   Nonspecific headache, and back pain. Patient reports having "cluster headaches and multiple sclerosis." He attributes these entities as being associated.  Nursing Notes Reviewed/ Care Coordinated Applicable Imaging Reviewed Interpretation of Laboratory Data incorporated into ED treatment  The patient appears reasonably screened and/or stabilized for discharge and I doubt any other medical condition or other [facility] requiring further screening, evaluation, or treatment in the ED at this time prior to discharge.  Plan: Home Medications- continue; Home Treatments- rest; return here if the recommended treatment, does not improve the symptoms; Recommended follow up- PCP prn    New Prescriptions Discharge Medication List as of 10/23/2016  6:09 AM     I personally performed the services described in this documentation, which was scribed in my presence. The recorded information has been reviewed and is  accurate.     Mancel Bale, MD 10/23/16 [phone]

## 2016-11-07 ENCOUNTER — Ambulatory Visit: Payer: Self-pay | Admitting: Oncology

## 2016-11-07 ENCOUNTER — Inpatient Hospital Stay: Payer: Medicare Other

## 2016-11-07 ENCOUNTER — Telehealth: Payer: Self-pay | Admitting: Pharmacy Technician

## 2016-11-07 ENCOUNTER — Telehealth: Payer: Self-pay | Admitting: Pharmacist Clinician (PhC)/ Clinical Pharmacy Specialist

## 2016-11-07 MED FILL — EPCLUSA 400 MG-100 MG TAB: 400-100 | 28 days supply | Qty: 28 | Fill #2

## 2016-11-07 NOTE — Telephone Encounter (Signed)
Jazen called and blamed the clustered headaches on Epclusa. He was at the ED recently. According to the ED physician, this cluster headache occurs once a year. He has missed the past 2 days so far. Called him back today to explained to him that it would be a nightmare to deal with resistance if he stop Epclusa. I convinced him to restart it and finish the 3rd month.

## 2016-11-07 NOTE — Telephone Encounter (Signed)
Spoke to Mr [name] to arrange delivery of his Dorita Fraypclusa.  He said he stopped it 3 days ago due to cluster headaches.  I explained that may compromise his Hepatitis C cure and I wished he had let us know, we could have changed his med.   He thought the Emergency Department people would let us know and his neurology doctor thinks the Dorita Fraypclusa is causing the headaches.  I explained that he will need a lab blood work done at some point to see if the less than 2 months cured him or not.  If not, he will need to retreat with more than one medication.   He said he understands.  I let the pharmacist Ulyses SouthwardMinh Pham know about the discussion.

## 2016-11-10 ENCOUNTER — Encounter (HOSPITAL_COMMUNITY): Payer: Self-pay | Admitting: Emergency Medicine

## 2016-11-10 ENCOUNTER — Emergency Department (HOSPITAL_COMMUNITY)
Admission: EM | Admit: 2016-11-10 | Discharge: 2016-11-10 | Disposition: A | Discharge status: Home / Self Care | Payer: Medicare HMO | Attending: Emergency Medicine | Admitting: Emergency Medicine

## 2016-11-10 DIAGNOSIS — R519 Headache, unspecified: Secondary | ICD-10-CM

## 2016-11-10 DIAGNOSIS — Z7901 Long term (current) use of anticoagulants: Secondary | ICD-10-CM | POA: Diagnosis not present

## 2016-11-10 DIAGNOSIS — I10 Essential (primary) hypertension: Secondary | ICD-10-CM | POA: Diagnosis not present

## 2016-11-10 DIAGNOSIS — F17210 Nicotine dependence, cigarettes, uncomplicated: Secondary | ICD-10-CM | POA: Diagnosis not present

## 2016-11-10 DIAGNOSIS — Z79899 Other long term (current) drug therapy: Secondary | ICD-10-CM | POA: Diagnosis not present

## 2016-11-10 DIAGNOSIS — G44009 Cluster headache syndrome, unspecified, not intractable: Secondary | ICD-10-CM | POA: Diagnosis present

## 2016-11-10 DIAGNOSIS — G35 Multiple sclerosis: Secondary | ICD-10-CM | POA: Diagnosis not present

## 2016-11-10 DIAGNOSIS — R51 Headache: Secondary | ICD-10-CM | POA: Insufficient documentation

## 2016-11-10 LAB — BASIC METABOLIC PANEL
Anion gap: 8 (ref 5–15)
BUN: 18 mg/dL (ref 6–20)
CHLORIDE: 102 mmol/L (ref 101–111)
CO2: 27 mmol/L (ref 22–32)
Calcium: 8.8 mg/dL — ABNORMAL LOW (ref 8.9–10.3)
Creatinine, Ser: 0.79 mg/dL (ref 0.61–1.24)
GFR calc non Af Amer: >60 mL/min (ref >60)
Glucose, Bld: 111 mg/dL — ABNORMAL HIGH (ref 65–99)
POTASSIUM: 3.8 mmol/L (ref 3.5–5.1)
SODIUM: 137 mmol/L (ref 135–145)

## 2016-11-10 LAB — CBC WITH DIFFERENTIAL/PLATELET
BASOS ABS: 0 10*3/uL (ref 0.0–0.1)
Basophils Relative: 0 %
Eosinophils Absolute: 0.3 10*3/uL (ref 0.0–0.7)
Eosinophils Relative: 4 %
HEMATOCRIT: 40.1 % (ref 39.0–52.0)
Hemoglobin: 13.4 g/dL (ref 13.0–17.0)
LYMPHS PCT: 50 %
Lymphs Abs: 4.1 10*3/uL — ABNORMAL HIGH (ref 0.7–4.0)
MCH: 26.6 pg (ref 26.0–34.0)
MCHC: 33.4 g/dL (ref 30.0–36.0)
MCV: 79.6 fL (ref 78.0–100.0)
MONO ABS: 0.7 10*3/uL (ref 0.1–1.0)
Monocytes Relative: 9 %
NEUTROS ABS: 3 10*3/uL (ref 1.7–7.7)
Neutrophils Relative %: 37 %
Platelets: 257 10*3/uL (ref 150–400)
RBC: 5.04 MIL/uL (ref 4.22–5.81)
RDW: 15.1 % (ref 11.5–15.5)
WBC: 8 10*3/uL (ref 4.0–10.5)

## 2016-11-10 MED ORDER — MAGNESIUM SULFATE 2 GM/50ML IV SOLN
2.0000 g | Freq: Once | INTRAVENOUS | Status: AC
  Administered 2016-11-10: 2 g via INTRAVENOUS
  Filled 2016-11-10: qty 50

## 2016-11-10 MED ORDER — OXYCODONE-ACETAMINOPHEN 5-325 MG PO TABS
2.0000 | ORAL_TABLET | Freq: Once | ORAL | Status: AC
  Administered 2016-11-10: 2 via ORAL
  Filled 2016-11-10: qty 2

## 2016-11-10 MED ORDER — OXYCODONE-ACETAMINOPHEN 5-325 MG PO TABS: 1.0000 | ORAL_TABLET | ORAL | 0 refills | Status: DC | PRN

## 2016-11-10 MED ORDER — HEPARIN SOD (PORK) LOCK FLUSH 100 UNIT/ML IV SOLN
500.0000 [IU] | Freq: Once | INTRAVENOUS | Status: AC
  Administered 2016-11-10: 500 [IU]
  Filled 2016-11-10: qty 5

## 2016-11-10 MED ORDER — SODIUM CHLORIDE 0.9 % IV BOLUS (SEPSIS)
1000.0000 mL | Freq: Once | INTRAVENOUS | Status: AC
  Administered 2016-11-10: 1000 mL via INTRAVENOUS

## 2016-11-10 MED ORDER — VALPROATE SODIUM 500 MG/5ML IV SOLN
1000.0000 mg | Freq: Once | INTRAVENOUS | Status: AC
  Administered 2016-11-10: 1000 mg via INTRAVENOUS
  Filled 2016-11-10: qty 10

## 2016-11-10 MED ORDER — PREDNISONE 10 MG PO TABS: ORAL_TABLET | ORAL | 0 refills | Status: DC

## 2016-11-10 MED ORDER — METOCLOPRAMIDE HCL 5 MG/ML IJ SOLN
10.0000 mg | Freq: Once | INTRAMUSCULAR | Status: AC
  Administered 2016-11-10: 10 mg via INTRAVENOUS
  Filled 2016-11-10: qty 2

## 2016-11-10 NOTE — ED Notes (Signed)
Bed: OZ30 Expected date:  Expected time:  Means of arrival:  Comments: EMS 56 yo male migraine/hx MS

## 2016-11-10 NOTE — ED Triage Notes (Signed)
Brought in by EMS from home with c/o "cluster headaches".  Pt reports that he was seen 2 weeks ago for same but s/s "did not completely resolve"--- symptoms got "so worsened today that he can't take it anymore".  Pt has hx of MS.

## 2016-11-10 NOTE — ED Notes (Signed)
[facility] called for transportation  

## 2016-11-10 NOTE — ED Provider Notes (Addendum)
[facility]-EMERGENCY DEPT Provider Note   CSN: [id number] Arrival date & time: 11/10/16  4098     History   Chief Complaint Chief Complaint  Patient presents with  . Cluster Headaches    HPI [name] is a 56 y.o. male.  He presents for evaluation of ongoing "cluster" headaches. The pain tends to come and go and is in the right side of his head, was initially better after treated here 2 weeks ago, then worsened after a few days, and has become more persistent. The pain is unrelenting and cannot be controlled by his home treatments. He stopped taking his medication for hepatitis C Dorita Fray) because he states that his neurologist told him that it would make his headaches worse. He states that he has spoken with his neurologist, since last seen in the emergency department, but not actually seen the neurologist. He states that he was told that he needed to use "oxygen and Percocet for his headaches." He ran out of his Percocet during the night last night when he took his last 2 pills. His last infusion for multiple sclerosis was 10/17/2016. There are no other known modifying factors.     Marland KitchenHPI  Past Medical History:  Diagnosis Date  . Abdominal pain, unspecified site   . Anxiety   . Arthritis   . Benign paroxysmal positional vertigo   . Chronic back pain   . Chronic pain syndrome 01/25/2008  . Cluster headache   . Depression    takes Zoloft daily  . DVT (deep venous thrombosis) (HCC)    in the left arm '09  . Gait abnormality    "uses mobile wheelchair, but is ambulatory"  . Gallstones 02/17/2009   resolved after gallbladder surgery.  [name] GERD (gastroesophageal reflux disease)    takes Omeprazole as needed  . Headache(784.0)    cluster headaches frequently-takes Topamax daily  . History of colonoscopy   . HTN (hypertension)    takes Lisinopril,Verapamil,and Triamterene HCTZ daily  . Insomnia 11/06/2008  . Joint pain   . Joint swelling    03-07-16 "swelling of right wrist"  "after a fall-xray done 03-06-16 "no fractures".  . Memory loss    no an issue at present 03-07-16  . Multiple sclerosis (HCC)    Dx. 2005 - Dr. Tinnie Gens follows LOV 4'17 tx. Tysabri monthly IV-[facility] , [place].  [name] Nonspecific elevation of levels of transaminase or lactic acid dehydrogenase (LDH)   . Other specified visual disturbances   . Other syndromes affecting cervical region   . Pneumonia 2009  . Trigeminal neuralgia     history" Multiple sclerosis"    Patient Active Problem List   Diagnosis Date Noted  . Chronic cluster headache, not intractable   . Community acquired pneumonia   . TB lung, latent   . HCAP (healthcare-associated pneumonia) 02/16/2016  . Hypokalemia 02/16/2016  . Cluster headache   . DVT (deep venous thrombosis) (HCC)   . Depression   . GERD (gastroesophageal reflux disease)   . HTN (hypertension)   . Essential hypertension   . Gastroesophageal reflux disease without esophagitis   . CAP (community acquired pneumonia)   . Multiple sclerosis (HCC) 08/02/2013  . ABDOMINAL BLOATING 10/14/2010  . LOOSE STOOLS 10/14/2010  . PULMONARY EMBOLISM, HX OF 10/14/2010    Past Surgical History:  Procedure Laterality Date  . CHOLECYSTECTOMY  02/20/2009  . COLONOSCOPY WITH PROPOFOL N/A 03/17/2016   Procedure: COLONOSCOPY WITH PROPOFOL;  Surgeon: [name], MD;  Location: [facility] ENDOSCOPY;  Service: Endoscopy;  Laterality: N/A;  . PORT A CATH REVISION N/A 07/06/2015   Procedure: Removal and replacement of PORT A CATH;  Surgeon: [name], MD;  Location: [facility] OR;  Service: General;  Laterality: N/A;  . PORTACATH PLACEMENT N/A 03/27/2014   Procedure: INSERTION PORT-A-CATH;  Surgeon: [name], MD;  Location: [facility] OR;  Service: General;  Laterality: N/A;       Home Medications    Prior to Admission medications   Medication Sig Start Date End Date Taking? Authorizing Provider  baclofen (LIORESAL) 20 MG tablet Take 20 mg by mouth 4 (four) times  daily as needed for muscle spasms.    Yes Historical Provider, MD  dalfampridine (AMPYRA) 10 MG TB12 Take 10 mg by mouth 2 (two) times daily.   Yes Historical Provider, MD  Eszopiclone (ESZOPICLONE) 3 MG TABS Take 3 mg by mouth at bedtime as needed (for sleep).    Yes Historical Provider, MD  gabapentin (NEURONTIN) 600 MG tablet Take 600 mg by mouth 4 (four) times daily.    Yes Historical Provider, MD  hydrochlorothiazide (HYDRODIURIL) 25 MG tablet Take 25 mg by mouth daily. 11/06/16  Yes Historical Provider, MD  levETIRAcetam (KEPPRA) 500 MG tablet Take 500 mg by mouth 2 (two) times daily.   Yes Historical Provider, MD  lisinopril-hydrochlorothiazide (PRINZIDE,ZESTORETIC) 10-12.5 MG per tablet Take 1 tablet by mouth daily.   Yes Historical Provider, MD  omeprazole (PRILOSEC) 40 MG capsule Take 40 mg by mouth daily. 10/20/16  Yes Historical Provider, MD  sertraline (ZOLOFT) 50 MG tablet Take 50 mg by mouth daily.    Yes Historical Provider, MD  Sofosbuvir-Velpatasvir (EPCLUSA) 400-100 MG TABS Take 1 tablet by mouth daily. 09/11/16  Yes Judyann Munson, MD  topiramate (TOPAMAX) 50 MG tablet Take 50 mg by mouth 2 (two) times daily. 02/18/16  Yes Historical Provider, MD  triamterene-hydrochlorothiazide (DYAZIDE) 37.5-25 MG capsule Take 1 capsule by mouth daily. 11/06/16  Yes Historical Provider, MD  verapamil (CALAN) 80 MG tablet Take 0.5 tablets (40 mg total) by mouth 2 (two) times daily. 02/17/16  Yes Vassie Loll, MD  warfarin (COUMADIN) 5 MG tablet Take 5 mg by mouth daily. 09/29/16  Yes Historical Provider, MD  Cefixime (SUPRAX) 400 MG CAPS capsule Take 1 capsule (400 mg total) by mouth daily. Patient not taking: Reported on 11/10/2016 02/17/16   Vassie Loll, MD  enoxaparin (LOVENOX) 120 MG/0.8ML injection Inject 0.8 mLs (120 mg total) into the skin daily. Patient not taking: Reported on 11/10/2016 03/18/16   Judyann Munson, MD  ondansetron (ZOFRAN ODT) 8 MG disintegrating tablet Take 1 tablet (8 mg total)  by mouth every 8 (eight) hours as needed for nausea or vomiting. Patient not taking: Reported on 11/10/2016 03/18/16   Judyann Munson, MD  oxyCODONE-acetaminophen (PERCOCET) 5-325 MG tablet Take 1 tablet by mouth every 4 (four) hours as needed for severe pain. 11/10/16   Mancel Bale, MD  predniSONE (DELTASONE) 10 MG tablet Take q day 6,5,4,3,2,1 11/10/16   Mancel Bale, MD    Family History Family History  Problem Relation Age of Onset  . Cancer Father   . Diabetes Mother     Social History Social History  Substance Use Topics  . Smoking status: Current Every Day Smoker    Packs/day: 1.00    Years: 38.00    Types: Cigarettes  . Smokeless tobacco: Never Used     Comment: cutting back  . Alcohol use 0.0 oz/week     Comment: occasional  Allergies   Patient has no known allergies.   Review of Systems Review of Systems   Physical Exam Updated Vital Signs BP 129/62   Pulse 68   Temp 97.6 F (36.4 C) (Oral)   Resp 16   Ht 5\' 9"  (1.753 m)   Wt 196 lb (88.9 kg)   SpO2 96%   BMI 28.94 kg/m   Physical Exam  Constitutional: He is oriented to person, place, and time. He appears well-developed and well-nourished.  HENT:  Head: Normocephalic and atraumatic.  Right Ear: External ear normal.  Left Ear: External ear normal.  Eyes: Conjunctivae and EOM are normal. Pupils are equal, round, and reactive to light.  Neck: Normal range of motion and phonation normal. Neck supple.  Cardiovascular: Normal rate, regular rhythm and normal heart sounds.   Pulmonary/Chest: Effort normal and breath sounds normal. He exhibits no bony tenderness.  Abdominal: Soft. There is no tenderness.  Musculoskeletal: Normal range of motion.  Neurological: He is alert and oriented to person, place, and time. No cranial nerve deficit or sensory deficit. He exhibits normal muscle tone. Coordination normal.  Skin: Skin is warm, dry and intact.  Psychiatric: He has a normal mood and affect. His behavior is  normal. Judgment and thought content normal.  Nursing note and vitals reviewed.    ED Treatments / Results  Labs (all labs ordered are listed, but only abnormal results are displayed) Labs Reviewed  BASIC METABOLIC PANEL - Abnormal; Notable for the following:       Result Value   Glucose, Bld 111 (*)    Calcium 8.8 (*)    All other components within normal limits  CBC WITH DIFFERENTIAL/PLATELET - Abnormal; Notable for the following:    Lymphs Abs 4.1 (*)    All other components within normal limits    EKG  EKG Interpretation None       Radiology No results found.  Procedures Procedures (including critical care time)  Medications Ordered in ED Medications  heparin lock flush 100 unit/mL (not administered)  sodium chloride 0.9 % bolus 1,000 mL (0 mLs Intravenous Stopped 11/10/16 1104)  valproate (DEPACON) 1,000 mg in dextrose 5 % 50 mL IVPB (0 mg Intravenous Stopped 11/10/16 1228)  magnesium sulfate IVPB 2 g 50 mL (0 g Intravenous Stopped 11/10/16 1040)  metoCLOPramide (REGLAN) injection 10 mg (10 mg Intravenous Given 11/10/16 0940)  oxyCODONE-acetaminophen (PERCOCET/ROXICET) 5-325 MG per tablet 2 tablet (2 tablets Oral Given 11/10/16 1229)     Initial Impression / Assessment and Plan / ED Course  I have reviewed the triage vital signs and the nursing notes.  Pertinent labs & imaging results that were available during my care of the patient were reviewed by me and considered in my medical decision making (see chart for details).  Clinical Course as of Nov 11 1227  Mon Nov 10, 2016  1107 He states that he is still having a headache. His Depakote infusion was delayed, and is currently infusing. He is asking for more medications for pain at this time.  [EW]  1107 A call has been placed to his neurologist, to discuss his case, and hopefully help arrange management as well as diagnostic needs at this time.  [EW]  1126 I discussed the case with Dr. Renata Caprice, on-call for Dr. Tinnie Gens,  who states that the patient does not appear to need any additional imaging today to evaluate for complications from multiple sclerosis. He states that it would be okay to treat pain with Percocet,  and a tapered dose of prednisone, to prevent recurrence of headache.  [EW]    Clinical Course User Index [EW] Mancel Bale, MD    Medications  heparin lock flush 100 unit/mL (not administered)  sodium chloride 0.9 % bolus 1,000 mL (0 mLs Intravenous Stopped 11/10/16 1104)  valproate (DEPACON) 1,000 mg in dextrose 5 % 50 mL IVPB (0 mg Intravenous Stopped 11/10/16 1228)  magnesium sulfate IVPB 2 g 50 mL (0 g Intravenous Stopped 11/10/16 1040)  metoCLOPramide (REGLAN) injection 10 mg (10 mg Intravenous Given 11/10/16 0940)  oxyCODONE-acetaminophen (PERCOCET/ROXICET) 5-325 MG per tablet 2 tablet (2 tablets Oral Given 11/10/16 1229)    Patient Vitals for the past 24 hrs:  BP Temp Temp src Pulse Resp SpO2 Height Weight  11/10/16 1106 129/62 - - 68 16 96 % - -  11/10/16 0913 123/75 - - (!) 58 16 96 % - -  11/10/16 0621 122/86 97.6 F (36.4 C) Oral 60 20 95 % 5\' 9"  (1.753 m) 196 lb (88.9 kg)    12:29 PM Reevaluation with update and discussion. After initial assessment and treatment, an updated evaluation reveals He is fairly comfortable, has no further complaints. Findings discussed with patient and all questions answered. Ishan Sanroman L    Final Clinical Impressions(s) / ED Diagnoses   Final diagnoses:  Nonintractable headache, unspecified chronicity pattern, unspecified headache type    Nonspecific persistent headache, with multiple sclerosis and medication intolerance. Patient is currently off his Epclusa, hence it is not likely contributing to his headache. Doubt MS complication, meningitis, acute intracranial space-occupying lesion or serious bacterial infection.  Nursing Notes Reviewed/ Care Coordinated Applicable Imaging Reviewed Interpretation of Laboratory Data incorporated into ED  treatment  The patient appears reasonably screened and/or stabilized for discharge and I doubt any other medical condition or other [facility] requiring further screening, evaluation, or treatment in the ED at this time prior to discharge.  Plan: Home Medications- continue; Home Treatments- rest; return here if the recommended treatment, does not improve the symptoms; Recommended follow up- PCP and Neurology prn   New Prescriptions New Prescriptions   OXYCODONE-ACETAMINOPHEN (PERCOCET) 5-325 MG TABLET    Take 1 tablet by mouth every 4 (four) hours as needed for severe pain.   PREDNISONE (DELTASONE) 10 MG TABLET    Take q day 6,5,4,3,2,1     Mancel Bale, MD 11/10/16 1231    Mancel Bale, MD 12/10/16 1623

## 2016-11-10 NOTE — ED Notes (Signed)
Bed: [facility] Expected date:  Expected time:  Means of arrival:  Comments: 

## 2016-11-11 ENCOUNTER — Inpatient Hospital Stay: Payer: Medicare Other

## 2016-11-12 ENCOUNTER — Ambulatory Visit: Payer: Medicare HMO

## 2016-11-13 ENCOUNTER — Inpatient Hospital Stay: Payer: Medicare HMO

## 2016-11-14 ENCOUNTER — Ambulatory Visit: Payer: Medicare Other

## 2016-11-14 ENCOUNTER — Emergency Department (HOSPITAL_COMMUNITY): Payer: Medicare HMO

## 2016-11-14 ENCOUNTER — Encounter (HOSPITAL_COMMUNITY): Payer: Self-pay

## 2016-11-14 ENCOUNTER — Inpatient Hospital Stay (HOSPITAL_COMMUNITY)
Admission: EM | Admit: 2016-11-14 | Discharge: 2016-11-21 | DRG: 683 | Disposition: A | Discharge status: Home Health | Payer: Medicare HMO | Attending: Internal Medicine | Admitting: Internal Medicine

## 2016-11-14 DIAGNOSIS — F329 Major depressive disorder, single episode, unspecified: Secondary | ICD-10-CM | POA: Diagnosis present

## 2016-11-14 DIAGNOSIS — I82409 Acute embolism and thrombosis of unspecified deep veins of unspecified lower extremity: Secondary | ICD-10-CM | POA: Diagnosis present

## 2016-11-14 DIAGNOSIS — R338 Other retention of urine: Secondary | ICD-10-CM | POA: Diagnosis present

## 2016-11-14 DIAGNOSIS — Z86711 Personal history of pulmonary embolism: Secondary | ICD-10-CM

## 2016-11-14 DIAGNOSIS — F17210 Nicotine dependence, cigarettes, uncomplicated: Secondary | ICD-10-CM | POA: Diagnosis present

## 2016-11-14 DIAGNOSIS — B182 Chronic viral hepatitis C: Secondary | ICD-10-CM | POA: Diagnosis present

## 2016-11-14 DIAGNOSIS — R339 Retention of urine, unspecified: Secondary | ICD-10-CM

## 2016-11-14 DIAGNOSIS — R0602 Shortness of breath: Secondary | ICD-10-CM | POA: Diagnosis not present

## 2016-11-14 DIAGNOSIS — Z7901 Long term (current) use of anticoagulants: Secondary | ICD-10-CM

## 2016-11-14 DIAGNOSIS — K5900 Constipation, unspecified: Secondary | ICD-10-CM | POA: Diagnosis present

## 2016-11-14 DIAGNOSIS — N179 Acute kidney failure, unspecified: Principal | ICD-10-CM | POA: Diagnosis present

## 2016-11-14 DIAGNOSIS — G35 Multiple sclerosis: Secondary | ICD-10-CM | POA: Diagnosis present

## 2016-11-14 DIAGNOSIS — G44029 Chronic cluster headache, not intractable: Secondary | ICD-10-CM | POA: Diagnosis present

## 2016-11-14 DIAGNOSIS — R81 Glycosuria: Secondary | ICD-10-CM | POA: Diagnosis present

## 2016-11-14 DIAGNOSIS — R739 Hyperglycemia, unspecified: Secondary | ICD-10-CM | POA: Diagnosis present

## 2016-11-14 DIAGNOSIS — R1084 Generalized abdominal pain: Secondary | ICD-10-CM

## 2016-11-14 DIAGNOSIS — G894 Chronic pain syndrome: Secondary | ICD-10-CM | POA: Diagnosis present

## 2016-11-14 DIAGNOSIS — I10 Essential (primary) hypertension: Secondary | ICD-10-CM | POA: Diagnosis present

## 2016-11-14 DIAGNOSIS — Z79899 Other long term (current) drug therapy: Secondary | ICD-10-CM

## 2016-11-14 DIAGNOSIS — R109 Unspecified abdominal pain: Secondary | ICD-10-CM

## 2016-11-14 DIAGNOSIS — G8929 Other chronic pain: Secondary | ICD-10-CM | POA: Diagnosis present

## 2016-11-14 DIAGNOSIS — F32A Depression, unspecified: Secondary | ICD-10-CM | POA: Diagnosis present

## 2016-11-14 DIAGNOSIS — Z86718 Personal history of other venous thrombosis and embolism: Secondary | ICD-10-CM

## 2016-11-14 DIAGNOSIS — R29898 Other symptoms and signs involving the musculoskeletal system: Secondary | ICD-10-CM

## 2016-11-14 DIAGNOSIS — N139 Obstructive and reflux uropathy, unspecified: Secondary | ICD-10-CM | POA: Diagnosis present

## 2016-11-14 DIAGNOSIS — N390 Urinary tract infection, site not specified: Secondary | ICD-10-CM | POA: Diagnosis present

## 2016-11-14 DIAGNOSIS — E869 Volume depletion, unspecified: Secondary | ICD-10-CM | POA: Diagnosis present

## 2016-11-14 DIAGNOSIS — K219 Gastro-esophageal reflux disease without esophagitis: Secondary | ICD-10-CM | POA: Diagnosis present

## 2016-11-14 DIAGNOSIS — R791 Abnormal coagulation profile: Secondary | ICD-10-CM | POA: Diagnosis present

## 2016-11-14 MED ORDER — ALBUTEROL SULFATE (2.5 MG/3ML) 0.083% IN NEBU
5.0000 mg | INHALATION_SOLUTION | Freq: Once | RESPIRATORY_TRACT | Status: AC
  Administered 2016-11-14: 5 mg via RESPIRATORY_TRACT
  Filled 2016-11-14: qty 6

## 2016-11-14 MED ORDER — DIPHENHYDRAMINE HCL 50 MG/ML IJ SOLN
25.0000 mg | Freq: Once | INTRAMUSCULAR | Status: AC
Start: 2016-11-14 — End: 2016-11-15
  Administered 2016-11-15: 25 mg via INTRAVENOUS
  Filled 2016-11-14: qty 1

## 2016-11-14 MED ORDER — PROCHLORPERAZINE EDISYLATE 5 MG/ML IJ SOLN
10.0000 mg | Freq: Once | INTRAMUSCULAR | Status: AC
  Administered 2016-11-15: 10 mg via INTRAVENOUS
  Filled 2016-11-14: qty 2

## 2016-11-14 NOTE — ED Notes (Signed)
Pt continues to scream out and curse.

## 2016-11-14 NOTE — ED Notes (Signed)
Pt. Wants labs drawn from port.RN,Janeen made aware.

## 2016-11-14 NOTE — ED Triage Notes (Signed)
Pt BIB [facility] from home. Pt c/o generalized abdominal pain. Pt is distended and states that it hurts to take a deep breath. Hx of MS. A&Ox4.

## 2016-11-14 NOTE — ED Provider Notes (Addendum)
[facility]-EMERGENCY DEPT Provider Note   CSN: [id number] Arrival date & time: 11/14/16  2014  By signing my name below, I, Clovis Pu, attest that this documentation has been prepared under the direction and in the presence of Gilda Crease, MD  Electronically Signed: Clovis Pu, ED Scribe. 11/14/16. 11:21 PM.  History   Chief Complaint Chief Complaint  Patient presents with  . Abdominal Pain    The history is provided by the patient. No language interpreter was used.   HPI Comments:  Tyreek Clabo is a 56 y.o. male, with a hx of cluster headaches and GERD, who presents to the Emergency Department complaining of sudden onset, moderate headache x today. He describes his pain as throbbing and notes it radiates to behind his right eye. He notes a hx of cluster headaches. Pt also reports centralized abdominal pain with associated vomiting x today and difficulty breathing. Last bowel movement was x 2 days. No alleviating factors noted. Pt denies any other associated symptoms and any other modifying factors at this time.    Past Medical History:  Diagnosis Date  . Abdominal pain, unspecified site   . Anxiety   . Arthritis   . Benign paroxysmal positional vertigo   . Chronic back pain   . Chronic pain syndrome 01/25/2008  . Cluster headache   . Depression    takes Zoloft daily  . DVT (deep venous thrombosis) (HCC)    in the left arm '09  . Gait abnormality    "uses mobile wheelchair, but is ambulatory"  . Gallstones 02/17/2009   resolved after gallbladder surgery.  [name] GERD (gastroesophageal reflux disease)    takes Omeprazole as needed  . Headache(784.0)    cluster headaches frequently-takes Topamax daily  . History of colonoscopy   . HTN (hypertension)    takes Lisinopril,Verapamil,and Triamterene HCTZ daily  . Insomnia 11/06/2008  . Joint pain   . Joint swelling    03-07-16 "swelling of right wrist" "after a fall-xray done 03-06-16 "no fractures".  . Memory loss     no an issue at present 03-07-16  . Multiple sclerosis (HCC)    Dx. 2005 - Dr. Tinnie Gens follows LOV 4'17 tx. Tysabri monthly IV-[facility] , [place].  [name] Nonspecific elevation of levels of transaminase or lactic acid dehydrogenase (LDH)   . Other specified visual disturbances   . Other syndromes affecting cervical region   . Pneumonia 2009  . Trigeminal neuralgia     history" Multiple sclerosis"    Patient Active Problem List   Diagnosis Date Noted  . Chronic cluster headache, not intractable   . Community acquired pneumonia   . TB lung, latent   . HCAP (healthcare-associated pneumonia) 02/16/2016  . Hypokalemia 02/16/2016  . Cluster headache   . DVT (deep venous thrombosis) (HCC)   . Depression   . GERD (gastroesophageal reflux disease)   . HTN (hypertension)   . Essential hypertension   . Gastroesophageal reflux disease without esophagitis   . CAP (community acquired pneumonia)   . Multiple sclerosis (HCC) 08/02/2013  . ABDOMINAL BLOATING 10/14/2010  . LOOSE STOOLS 10/14/2010  . PULMONARY EMBOLISM, HX OF 10/14/2010    Past Surgical History:  Procedure Laterality Date  . CHOLECYSTECTOMY  02/20/2009  . COLONOSCOPY WITH PROPOFOL N/A 03/17/2016   Procedure: COLONOSCOPY WITH PROPOFOL;  Surgeon: [name], MD;  Location: [facility] ENDOSCOPY;  Service: Endoscopy;  Laterality: N/A;  . PORT A CATH REVISION N/A 07/06/2015   Procedure: Removal and replacement of PORT A  CATH;  Surgeon: [name], MD;  Location: [facility] OR;  Service: General;  Laterality: N/A;  . PORTACATH PLACEMENT N/A 03/27/2014   Procedure: INSERTION PORT-A-CATH;  Surgeon: [name], MD;  Location: [facility] OR;  Service: General;  Laterality: N/A;       Home Medications    Prior to Admission medications   Medication Sig Start Date End Date Taking? Authorizing Provider  baclofen (LIORESAL) 20 MG tablet Take 20 mg by mouth 4 (four) times daily as needed for muscle spasms.    Yes Historical Provider, MD   dalfampridine (AMPYRA) 10 MG TB12 Take 10 mg by mouth 2 (two) times daily.   Yes Historical Provider, MD  Eszopiclone (ESZOPICLONE) 3 MG TABS Take 3 mg by mouth at bedtime as needed (for sleep).    Yes Historical Provider, MD  gabapentin (NEURONTIN) 600 MG tablet Take 600 mg by mouth 4 (four) times daily.    Yes Historical Provider, MD  hydrochlorothiazide (HYDRODIURIL) 25 MG tablet Take 25 mg by mouth daily. 11/06/16  Yes Historical Provider, MD  levETIRAcetam (KEPPRA) 500 MG tablet Take 500 mg by mouth 2 (two) times daily.   Yes Historical Provider, MD  lisinopril-hydrochlorothiazide (PRINZIDE,ZESTORETIC) 10-12.5 MG per tablet Take 1 tablet by mouth daily.   Yes Historical Provider, MD  oxyCODONE-acetaminophen (PERCOCET) 5-325 MG tablet Take 1 tablet by mouth every 4 (four) hours as needed for severe pain. 11/10/16  Yes Mancel Bale, MD  sertraline (ZOLOFT) 50 MG tablet Take 50 mg by mouth daily.    Yes Historical Provider, MD  Sofosbuvir-Velpatasvir (EPCLUSA) 400-100 MG TABS Take 1 tablet by mouth daily. 09/11/16  Yes Judyann Munson, MD  topiramate (TOPAMAX) 50 MG tablet Take 50 mg by mouth 2 (two) times daily. 02/18/16  Yes Historical Provider, MD  triamterene-hydrochlorothiazide (DYAZIDE) 37.5-25 MG capsule  11/06/16  Yes Historical Provider, MD  verapamil (CALAN) 80 MG tablet Take 0.5 tablets (40 mg total) by mouth 2 (two) times daily. 02/17/16  Yes Vassie Loll, MD  warfarin (COUMADIN) 5 MG tablet Take 5 mg by mouth daily. 09/29/16  Yes Historical Provider, MD  Cefixime (SUPRAX) 400 MG CAPS capsule Take 1 capsule (400 mg total) by mouth daily. Patient not taking: Reported on 11/15/2016 02/17/16   Vassie Loll, MD  enoxaparin (LOVENOX) 120 MG/0.8ML injection Inject 0.8 mLs (120 mg total) into the skin daily. Patient not taking: Reported on 11/15/2016 03/18/16   Judyann Munson, MD  omeprazole (PRILOSEC) 40 MG capsule Take 40 mg by mouth daily. 10/20/16   Historical Provider, MD  ondansetron (ZOFRAN  ODT) 8 MG disintegrating tablet Take 1 tablet (8 mg total) by mouth every 8 (eight) hours as needed for nausea or vomiting. Patient not taking: Reported on 11/15/2016 03/18/16   Judyann Munson, MD  predniSONE (DELTASONE) 10 MG tablet Take q day 6,5,4,3,2,1 Patient not taking: Reported on 11/15/2016 11/10/16   Mancel Bale, MD    Family History Family History  Problem Relation Age of Onset  . Cancer Father   . Diabetes Mother     Social History Social History  Substance Use Topics  . Smoking status: Current Every Day Smoker    Packs/day: 1.00    Years: 38.00    Types: Cigarettes  . Smokeless tobacco: Never Used     Comment: cutting back  . Alcohol use 0.0 oz/week     Comment: occasional     Allergies   Patient has no known allergies.   Review of Systems Review of Systems  Gastrointestinal:  Positive for abdominal pain, constipation and vomiting.  Neurological: Positive for headaches.  All other systems reviewed and are negative.  Physical Exam Updated Vital Signs BP 129/85 (BP Location: Right Arm)   Pulse 86   Temp 97.5 F (36.4 C) (Oral)   Resp 20   Ht 5\' 9"  (1.753 m)   Wt 196 lb (88.9 kg)   SpO2 95%   BMI 28.94 kg/m   Physical Exam  Constitutional: He is oriented to person, place, and time. He appears well-developed and well-nourished. No distress.  HENT:  Head: Normocephalic and atraumatic.  Right Ear: Hearing normal.  Left Ear: Hearing normal.  Nose: Nose normal.  Mouth/Throat: Oropharynx is clear and moist and mucous membranes are normal.  Eyes: Conjunctivae and EOM are normal. Pupils are equal, round, and reactive to light.  Neck: Normal range of motion. Neck supple.  Cardiovascular: Regular rhythm, S1 normal and S2 normal.  Exam reveals no gallop and no friction rub.   No murmur heard. Pulmonary/Chest: Effort normal and breath sounds normal. No respiratory distress. He exhibits no tenderness.  Abdominal: Soft. Normal appearance. He exhibits distension.  There is no hepatosplenomegaly. There is tenderness. There is no rebound, no guarding, no tenderness at McBurney's point and negative Murphy's sign. No hernia.  Abdomen diffusely tender. Decreased bowel sounds.   Musculoskeletal: Normal range of motion.  Neurological: He is alert and oriented to person, place, and time. He has normal strength. No cranial nerve deficit or sensory deficit. Coordination normal. GCS eye subscore is 4. GCS verbal subscore is 5. GCS motor subscore is 6.  Skin: Skin is warm, dry and intact. No rash noted. No cyanosis.  Psychiatric: He has a normal mood and affect. His speech is normal and behavior is normal. Thought content normal.  Nursing note and vitals reviewed.    ED Treatments / Results  DIAGNOSTIC STUDIES:  Oxygen Saturation is 99% on RA, normal by my interpretation.    COORDINATION OF CARE:  11:18 PM Discussed treatment plan with pt at bedside and pt agreed to plan.  Labs (all labs ordered are listed, but only abnormal results are displayed) Labs Reviewed  COMPREHENSIVE METABOLIC PANEL - Abnormal; Notable for the following:       Result Value   Chloride 112 (*)    Glucose, Bld 151 (*)    BUN 41 (*)    Creatinine, Ser 1.67 (*)    Total Protein 9.0 (*)    GFR calc non Af Amer 44 (*)    GFR calc Af Amer 51 (*)    All other components within normal limits  CBC - Abnormal; Notable for the following:    WBC 12.2 (*)    RBC 5.87 (*)    RDW 15.7 (*)    All other components within normal limits  URINALYSIS, ROUTINE W REFLEX MICROSCOPIC - Abnormal; Notable for the following:    Glucose, UA 50 (*)    Protein, ur 30 (*)    Bacteria, UA RARE (*)    All other components within normal limits  LIPASE, BLOOD    EKG  EKG Interpretation None       Radiology Ct Abdomen Pelvis Wo Contrast  Result Date: 11/15/2016 CLINICAL DATA:  New onset generalized abdominal pain EXAM: CT ABDOMEN AND PELVIS WITHOUT CONTRAST TECHNIQUE: Multidetector CT imaging of  the abdomen and pelvis was performed following the standard protocol without IV contrast. COMPARISON:  05/14/2014 FINDINGS: Lower chest: No acute abnormality. Hepatobiliary: No focal liver abnormality is seen.  Status post cholecystectomy. No biliary dilatation. Pancreas: Unremarkable. No pancreatic ductal dilatation or surrounding inflammatory changes. Spleen: Normal in size without focal abnormality. Adrenals/Urinary Tract: Adrenal glands are unremarkable. Kidneys are normal, without renal calculi, focal lesion, or hydronephrosis. Bladder is decompressed around a Foley catheter. Stomach/Bowel: Stomach is within normal limits. Appendix is normal. No evidence of bowel wall thickening, distention, or inflammatory changes. Vascular/Lymphatic: The abdominal aorta is normal in caliber. Mild atherosclerotic calcification. No adenopathy in the abdomen or pelvis. Reproductive: Unremarkable Other: No acute inflammation.  No ascites. Musculoskeletal: No significant skeletal lesion. IMPRESSION: No significant abnormality Electronically Signed   By: Ellery Plunk M.D.   On: 11/15/2016 06:25   Dg Abd Acute W/chest  Result Date: 11/15/2016 CLINICAL DATA:  Initial evaluation for acute generalized abdominal pain. EXAM: DG ABDOMEN ACUTE W/ 1V CHEST COMPARISON:  Prior MRI from 10/14/2016 as well as previous radiograph from 02/15/2016. FINDINGS: Accessed right Port-A-Cath in place with tip overlying the cavoatrial junction. Cardiac and mediastinal silhouettes within normal limits. Mild elevation left hemidiaphragm. No focal infiltrates. No pulmonary edema or pleural effusion. No pneumothorax. Bowel gas pattern within normal limits without evidence for obstruction or ileus. Mild gaseous distension of the stomach. No abnormal bowel wall thickening. Moderate amount of retained stool within the colon. No soft tissue mass. Patient is status post cholecystectomy. Scattered vascular calcifications noted. Degenerative changes noted  within the spine. IMPRESSION: 1. Mild gaseous distension of the stomach. 2. Otherwise nonobstructive bowel gas pattern with no radiographic evidence for acute intra-abdominal process. 3. Moderate amount of retained stool throughout the colon. 4. Mild elevation left hemidiaphragm. No other active cardiopulmonary disease identified. Electronically Signed   By: Rise Mu M.D.   On: 11/15/2016 00:25    Procedures Procedures (including critical care time)  Medications Ordered in ED Medications  albuterol (PROVENTIL) (2.5 MG/3ML) 0.083% nebulizer solution 5 mg (5 mg Nebulization Given 11/14/16 2031)  prochlorperazine (COMPAZINE) injection 10 mg (10 mg Intravenous Given 11/15/16 0012)  diphenhydrAMINE (BENADRYL) injection 25 mg (25 mg Intravenous Given 11/15/16 0014)  HYDROmorphone (DILAUDID) injection 1 mg (1 mg Intravenous Given 11/15/16 0157)  ondansetron (ZOFRAN) injection 4 mg (4 mg Intravenous Given 11/15/16 0156)     Initial Impression / Assessment and Plan / ED Course  I have reviewed the triage vital signs and the nursing notes.  Pertinent labs & imaging results that were available during my care of the patient were reviewed by me and considered in my medical decision making (see chart for details).  Clinical Course    Patient presents to the [facility] with multiple complaints. Patient reports that that he has a history of cluster headaches and is having a right-sided headache behind his right eye that is similar to previous clusters. He did not improve with oxygen. Patient was given migraine cocktail treatment without improvement. Not have any neurologic symptoms. This is not the worse headache of his life or unusual compared to previous headaches.  Also complaining of abdominal pain. Examination revealed diffuse tenderness without signs of peritonitis. Lab work was unremarkable other than very slight, nonspecific, leukocytosis. CT scan performed to further evaluate. No acute findings  are present.  Patient did have urinary retention present on arrival. Etiology is unclear. He had 800 mL of urine in his bladder was unable to urinate. This is likely causing some of his pain. He did have slight elevation of BUN/creatinine. A Foley catheter was placed in his bladder was emptied. Urinary retention may be secondary to MS. Abdominal exam  revealed less distention and tenderness after Foley catheter placement. Catheter will be maintained in place.  After above workup, patient was readied to be discharged, but started to complain of increasing shortness of breath. Reviewing records reveals history of PE. Patient not experiencing tachycardia, hypoxia or pleuritic pain, but with positive history an unknown compliance with Coumadin, will require CT angiography to rule out acute PE. Will sign out to oncoming ER physician to disposition.  Final Clinical Impressions(s) / ED Diagnoses   Final diagnoses:  Generalized abdominal pain  Chronic cluster headache, not intractable  Urinary retention    New Prescriptions New Prescriptions   No medications on file  I personally performed the services described in this documentation, which was scribed in my presence. The recorded information has been reviewed and is accurate.     Gilda Crease, MD 11/15/16 1610    Gilda Crease, MD 11/15/16 [phone]

## 2016-11-14 NOTE — ED Notes (Signed)
Pt states that he cannot breathe in the breathing treatment, although this RN has witnessed him doing it.

## 2016-11-14 NOTE — ED Notes (Signed)
Port accessed successfully. Blood drawn and sent to lab.

## 2016-11-15 ENCOUNTER — Emergency Department (HOSPITAL_COMMUNITY): Payer: Medicare HMO

## 2016-11-15 ENCOUNTER — Inpatient Hospital Stay (HOSPITAL_COMMUNITY): Payer: Medicare HMO

## 2016-11-15 DIAGNOSIS — E869 Volume depletion, unspecified: Secondary | ICD-10-CM | POA: Diagnosis present

## 2016-11-15 DIAGNOSIS — B182 Chronic viral hepatitis C: Secondary | ICD-10-CM | POA: Diagnosis present

## 2016-11-15 DIAGNOSIS — R109 Unspecified abdominal pain: Secondary | ICD-10-CM | POA: Diagnosis not present

## 2016-11-15 DIAGNOSIS — N179 Acute kidney failure, unspecified: Secondary | ICD-10-CM | POA: Diagnosis not present

## 2016-11-15 DIAGNOSIS — G35 Multiple sclerosis: Secondary | ICD-10-CM | POA: Diagnosis not present

## 2016-11-15 DIAGNOSIS — G44029 Chronic cluster headache, not intractable: Secondary | ICD-10-CM | POA: Diagnosis not present

## 2016-11-15 DIAGNOSIS — G8929 Other chronic pain: Secondary | ICD-10-CM | POA: Diagnosis present

## 2016-11-15 DIAGNOSIS — R1084 Generalized abdominal pain: Secondary | ICD-10-CM | POA: Diagnosis not present

## 2016-11-15 DIAGNOSIS — R338 Other retention of urine: Secondary | ICD-10-CM | POA: Diagnosis not present

## 2016-11-15 DIAGNOSIS — N390 Urinary tract infection, site not specified: Secondary | ICD-10-CM | POA: Diagnosis present

## 2016-11-15 DIAGNOSIS — I10 Essential (primary) hypertension: Secondary | ICD-10-CM | POA: Diagnosis not present

## 2016-11-15 DIAGNOSIS — F329 Major depressive disorder, single episode, unspecified: Secondary | ICD-10-CM | POA: Diagnosis present

## 2016-11-15 DIAGNOSIS — G894 Chronic pain syndrome: Secondary | ICD-10-CM | POA: Diagnosis present

## 2016-11-15 DIAGNOSIS — R739 Hyperglycemia, unspecified: Secondary | ICD-10-CM | POA: Diagnosis present

## 2016-11-15 DIAGNOSIS — Z86711 Personal history of pulmonary embolism: Secondary | ICD-10-CM | POA: Diagnosis not present

## 2016-11-15 DIAGNOSIS — F17210 Nicotine dependence, cigarettes, uncomplicated: Secondary | ICD-10-CM | POA: Diagnosis present

## 2016-11-15 DIAGNOSIS — Z79899 Other long term (current) drug therapy: Secondary | ICD-10-CM | POA: Diagnosis not present

## 2016-11-15 DIAGNOSIS — R0602 Shortness of breath: Secondary | ICD-10-CM | POA: Diagnosis present

## 2016-11-15 DIAGNOSIS — Z86718 Personal history of other venous thrombosis and embolism: Secondary | ICD-10-CM | POA: Diagnosis not present

## 2016-11-15 DIAGNOSIS — Z7901 Long term (current) use of anticoagulants: Secondary | ICD-10-CM | POA: Diagnosis not present

## 2016-11-15 DIAGNOSIS — R791 Abnormal coagulation profile: Secondary | ICD-10-CM | POA: Diagnosis present

## 2016-11-15 DIAGNOSIS — N139 Obstructive and reflux uropathy, unspecified: Secondary | ICD-10-CM | POA: Diagnosis present

## 2016-11-15 DIAGNOSIS — K5900 Constipation, unspecified: Secondary | ICD-10-CM | POA: Diagnosis present

## 2016-11-15 DIAGNOSIS — K219 Gastro-esophageal reflux disease without esophagitis: Secondary | ICD-10-CM | POA: Diagnosis present

## 2016-11-15 DIAGNOSIS — R81 Glycosuria: Secondary | ICD-10-CM | POA: Diagnosis present

## 2016-11-15 LAB — RAPID URINE DRUG SCREEN, HOSP PERFORMED
AMPHETAMINES: NOT DETECTED
BARBITURATES: NOT DETECTED
BENZODIAZEPINES: NOT DETECTED
COCAINE: NOT DETECTED
OPIATES: NOT DETECTED
Tetrahydrocannabinol: NOT DETECTED

## 2016-11-15 LAB — URINALYSIS, ROUTINE W REFLEX MICROSCOPIC
BILIRUBIN URINE: NEGATIVE
GLUCOSE, UA: 50 mg/dL — AB
HGB URINE DIPSTICK: NEGATIVE
Ketones, ur: NEGATIVE mg/dL
LEUKOCYTES UA: NEGATIVE
NITRITE: NEGATIVE
Protein, ur: 30 mg/dL — AB
SPECIFIC GRAVITY, URINE: 1.015 (ref 1.005–1.030)
Squamous Epithelial / HPF: NONE SEEN
pH: 5 (ref 5.0–8.0)

## 2016-11-15 LAB — COMPREHENSIVE METABOLIC PANEL
ALT: 29 U/L (ref 17–63)
AST: 27 U/L (ref 15–41)
Albumin: 4.9 g/dL (ref 3.5–5.0)
Alkaline Phosphatase: 100 U/L (ref 38–126)
Anion gap: 10 (ref 5–15)
BILIRUBIN TOTAL: 0.4 mg/dL (ref 0.3–1.2)
BUN: 41 mg/dL — ABNORMAL HIGH (ref 6–20)
CO2: 23 mmol/L (ref 22–32)
Calcium: 9.3 mg/dL (ref 8.9–10.3)
Chloride: 112 mmol/L — ABNORMAL HIGH (ref 101–111)
Creatinine, Ser: 1.67 mg/dL — ABNORMAL HIGH (ref 0.61–1.24)
GFR, EST AFRICAN AMERICAN: 51 mL/min — AB (ref >60)
GFR, EST NON AFRICAN AMERICAN: 44 mL/min — AB (ref >60)
Glucose, Bld: 151 mg/dL — ABNORMAL HIGH (ref 65–99)
POTASSIUM: 3.8 mmol/L (ref 3.5–5.1)
Sodium: 145 mmol/L (ref 135–145)
TOTAL PROTEIN: 9 g/dL — AB (ref 6.5–8.1)

## 2016-11-15 LAB — PROTIME-INR
INR: 7.53
PROTHROMBIN TIME: 66.4 s — AB (ref 11.4–15.2)

## 2016-11-15 LAB — CBC
HEMATOCRIT: 47.2 % (ref 39.0–52.0)
Hemoglobin: 15.5 g/dL (ref 13.0–17.0)
MCH: 26.4 pg (ref 26.0–34.0)
MCHC: 32.8 g/dL (ref 30.0–36.0)
MCV: 80.4 fL (ref 78.0–100.0)
PLATELETS: 362 10*3/uL (ref 150–400)
RBC: 5.87 MIL/uL — AB (ref 4.22–5.81)
RDW: 15.7 % — ABNORMAL HIGH (ref 11.5–15.5)
WBC: 12.2 10*3/uL — AB (ref 4.0–10.5)

## 2016-11-15 LAB — LIPASE, BLOOD: LIPASE: 18 U/L (ref 11–51)

## 2016-11-15 MED ORDER — SODIUM CHLORIDE 0.9 % IV SOLN
Freq: Once | INTRAVENOUS | Status: AC
  Administered 2016-11-15: 09:00:00 via INTRAVENOUS

## 2016-11-15 MED ORDER — SODIUM CHLORIDE 0.45 % IV SOLN
INTRAVENOUS | Status: AC
  Administered 2016-11-15 – 2016-11-16 (×2): via INTRAVENOUS
  Filled 2016-11-15 (×3): qty 1000

## 2016-11-15 MED ORDER — ACETAMINOPHEN 650 MG RE SUPP: 650.0000 mg | Freq: Four times a day (QID) | RECTAL | Status: DC | PRN

## 2016-11-15 MED ORDER — SERTRALINE HCL 50 MG PO TABS
50.0000 mg | ORAL_TABLET | Freq: Every day | ORAL | Status: DC
  Administered 2016-11-15 – 2016-11-21 (×7): 50 mg via ORAL
  Filled 2016-11-15 (×7): qty 1

## 2016-11-15 MED ORDER — BACLOFEN 20 MG PO TABS
20.0000 mg | ORAL_TABLET | Freq: Four times a day (QID) | ORAL | Status: DC | PRN
  Administered 2016-11-18 – 2016-11-19 (×2): 20 mg via ORAL
  Filled 2016-11-15 (×3): qty 1

## 2016-11-15 MED ORDER — SODIUM CHLORIDE 0.9 % IV BOLUS (SEPSIS)
500.0000 mL | Freq: Once | INTRAVENOUS | Status: AC
  Administered 2016-11-15: 500 mL via INTRAVENOUS

## 2016-11-15 MED ORDER — GABAPENTIN 600 MG PO TABS
600.0000 mg | ORAL_TABLET | Freq: Four times a day (QID) | ORAL | Status: DC
  Filled 2016-11-15: qty 1

## 2016-11-15 MED ORDER — GABAPENTIN 300 MG PO CAPS
600.0000 mg | ORAL_CAPSULE | Freq: Four times a day (QID) | ORAL | Status: DC
  Administered 2016-11-15 – 2016-11-21 (×24): 600 mg via ORAL
  Filled 2016-11-15 (×25): qty 2

## 2016-11-15 MED ORDER — SOFOSBUVIR-VELPATASVIR 400-100 MG PO TABS: 1.0000 | ORAL_TABLET | Freq: Every day | ORAL | Status: DC

## 2016-11-15 MED ORDER — DEXTROSE 5 % IV SOLN
1.0000 g | Freq: Once | INTRAVENOUS | Status: AC
  Administered 2016-11-15: 1 g via INTRAVENOUS
  Filled 2016-11-15: qty 10

## 2016-11-15 MED ORDER — PROMETHAZINE HCL 25 MG PO TABS: 25.0000 mg | ORAL_TABLET | Freq: Four times a day (QID) | ORAL | 0 refills | Status: DC | PRN

## 2016-11-15 MED ORDER — LEVETIRACETAM 500 MG PO TABS
500.0000 mg | ORAL_TABLET | Freq: Two times a day (BID) | ORAL | Status: DC
  Administered 2016-11-15 – 2016-11-21 (×13): 500 mg via ORAL
  Filled 2016-11-15 (×13): qty 1

## 2016-11-15 MED ORDER — ACETAMINOPHEN 325 MG PO TABS
650.0000 mg | ORAL_TABLET | Freq: Four times a day (QID) | ORAL | Status: DC | PRN
  Administered 2016-11-16 – 2016-11-17 (×2): 650 mg via ORAL
  Filled 2016-11-15 (×2): qty 2

## 2016-11-15 MED ORDER — TOPIRAMATE 25 MG PO TABS: 50.0000 mg | ORAL_TABLET | Freq: Two times a day (BID) | ORAL | Status: DC

## 2016-11-15 MED ORDER — SENNA 8.6 MG PO TABS
2.0000 | ORAL_TABLET | Freq: Every day | ORAL | Status: DC
  Administered 2016-11-15 – 2016-11-16 (×2): 17.2 mg via ORAL
  Filled 2016-11-15 (×2): qty 2

## 2016-11-15 MED ORDER — TAMSULOSIN HCL 0.4 MG PO CAPS: 0.4000 mg | ORAL_CAPSULE | Freq: Every day | ORAL | 0 refills | Status: DC

## 2016-11-15 MED ORDER — POLYETHYLENE GLYCOL 3350 17 G PO PACK
17.0000 g | PACK | Freq: Every day | ORAL | Status: DC
  Administered 2016-11-15 – 2016-11-16 (×2): 17 g via ORAL
  Filled 2016-11-15 (×2): qty 1

## 2016-11-15 MED ORDER — PHYTONADIONE 5 MG PO TABS
2.5000 mg | ORAL_TABLET | Freq: Once | ORAL | Status: AC
  Administered 2016-11-15: 2.5 mg via ORAL
  Filled 2016-11-15: qty 1

## 2016-11-15 MED ORDER — ONDANSETRON HCL 4 MG/2ML IJ SOLN
4.0000 mg | Freq: Once | INTRAMUSCULAR | Status: AC
  Administered 2016-11-15: 4 mg via INTRAVENOUS
  Filled 2016-11-15: qty 2

## 2016-11-15 MED ORDER — IOPAMIDOL (ISOVUE-370) INJECTION 76%
INTRAVENOUS | Status: AC
  Filled 2016-11-15: qty 100

## 2016-11-15 MED ORDER — IOPAMIDOL (ISOVUE-370) INJECTION 76%
100.0000 mL | Freq: Once | INTRAVENOUS | Status: AC | PRN
  Administered 2016-11-15: 100 mL via INTRAVENOUS

## 2016-11-15 MED ORDER — DALFAMPRIDINE ER 10 MG PO TB12
10.0000 mg | ORAL_TABLET | Freq: Two times a day (BID) | ORAL | Status: DC
  Administered 2016-11-16 – 2016-11-20 (×10): 10 mg via ORAL

## 2016-11-15 MED ORDER — ONDANSETRON HCL 4 MG/2ML IJ SOLN
4.0000 mg | Freq: Four times a day (QID) | INTRAMUSCULAR | Status: DC | PRN
  Administered 2016-11-16 – 2016-11-21 (×3): 4 mg via INTRAVENOUS
  Filled 2016-11-15 (×3): qty 2

## 2016-11-15 MED ORDER — ONDANSETRON HCL 4 MG PO TABS
4.0000 mg | ORAL_TABLET | Freq: Four times a day (QID) | ORAL | Status: DC | PRN
  Administered 2016-11-18: 13:00:00 4 mg via ORAL
  Filled 2016-11-15: qty 1

## 2016-11-15 MED ORDER — OXYCODONE-ACETAMINOPHEN 5-325 MG PO TABS
1.0000 | ORAL_TABLET | ORAL | Status: DC | PRN
  Administered 2016-11-15 – 2016-11-21 (×29): 1 via ORAL
  Filled 2016-11-15 (×29): qty 1

## 2016-11-15 MED ORDER — HYDROMORPHONE HCL 1 MG/ML IJ SOLN
1.0000 mg | Freq: Once | INTRAMUSCULAR | Status: AC
  Administered 2016-11-15: 1 mg via INTRAVENOUS
  Filled 2016-11-15: qty 1

## 2016-11-15 MED ORDER — VERAPAMIL HCL 40 MG PO TABS
40.0000 mg | ORAL_TABLET | Freq: Two times a day (BID) | ORAL | Status: DC
  Administered 2016-11-15 – 2016-11-20 (×8): 40 mg via ORAL
  Filled 2016-11-15 (×13): qty 1

## 2016-11-15 MED ORDER — TOPIRAMATE 25 MG PO TABS
50.0000 mg | ORAL_TABLET | Freq: Two times a day (BID) | ORAL | Status: DC
  Administered 2016-11-15 – 2016-11-21 (×13): 50 mg via ORAL
  Filled 2016-11-15 (×13): qty 2

## 2016-11-15 MED ORDER — ZOLPIDEM TARTRATE 5 MG PO TABS
5.0000 mg | ORAL_TABLET | Freq: Every evening | ORAL | Status: DC | PRN
  Administered 2016-11-17 – 2016-11-20 (×5): 5 mg via ORAL
  Filled 2016-11-15 (×5): qty 1

## 2016-11-15 MED ORDER — PANTOPRAZOLE SODIUM 40 MG PO TBEC
80.0000 mg | DELAYED_RELEASE_TABLET | Freq: Every day | ORAL | Status: DC
  Administered 2016-11-15 – 2016-11-21 (×7): 80 mg via ORAL
  Filled 2016-11-15 (×7): qty 2

## 2016-11-15 MED ORDER — DEXTROSE 5 % IV SOLN
1.0000 g | INTRAVENOUS | Status: DC
  Administered 2016-11-16: 08:00:00 1 g via INTRAVENOUS
  Filled 2016-11-15: qty 10

## 2016-11-15 NOTE — ED Notes (Signed)
Off floor for testing 

## 2016-11-15 NOTE — ED Notes (Signed)
Pt in Ct  

## 2016-11-15 NOTE — ED Notes (Signed)
Report given-transfer to 6 th floor

## 2016-11-15 NOTE — Progress Notes (Signed)
Pt was sitting up in bed having lunch when [facility] arrived. Pt said he just wanted prayer for healing. Pt and I prayed together for which he was very grateful for visit and prayer. Please page if additional support is needed. Chaplain Marjory Lies, M.Div.   11/15/16 1300  Clinical Encounter Type  Visited With Patient

## 2016-11-15 NOTE — H&P (Addendum)
History and Physical  [name] OHY:[id number] DOB: [birth year] DOA: 11/14/2016   PCP: Dorrene German, MD    Patient coming from: Home  Chief Complaint: Abdominal pain, cluster headache, dyspnea  HPI:  [name] is a 56 y.o. male with medical history of multiple sclerosis, hypertension, chronic hepatitis C, chronic pain syndrome, DVT left arm, depression presents with 2 day history of abdominal pain, "cluster headache", and dyspnea. The patient normally is able to make transfers but essentially is nonambulatory at baseline. The patient had been complaining of dysuria and difficulty urinating for a period of 2 days with associated abdominal pain that was generalized. He denied any fevers, chills, nausea, vomiting, diarrhea. He normally is able to urinate spontaneously without difficulty. In the emergency department, the patient was found to have urinary retention and a Foley catheter was inserted which drained > 800 cc  of urine. Thereafter, the patient states that his abdominal pain was a low but better but continued to have generalized pain in his abdomen. He states that he has had poor oral intake for 2 days, but in the emergency department he was able to eat 2 sandwiches and drank numerous cans of soda. He denied any hematochezia, melena, but complains of constipation. In addition, the patient initially complained of a "cluster headache "behind his right eye. The patient was given Benadryl, Dilaudid, and Benadryl with some relief in the emergency department. Interestingly later in the examination, the patient complained of pain all over his head even with a feather touch. He denied any visual disturbance, but complained of some increased weakness of his upper extremities with numbness and tingling in his hands in the past 2 days. He denied any other worsening of his lower extremities or increased dysesthesias in his lower extremities or other parts of his body. Finally, the patient had  been complaining of shortness of breath with exertion over the past 2 days. He continues to smoke one half pack per day, but has had a history of 20-pack-years. He denied any coughing, sore throat, fevers, chills, hemoptysis. He endorses compliance with all his medications   In the emergency department, the patient was afebrile and hemodynamically stable saturating 96-99% on room air. BMP showed serum creatinine 1.67. Otherwise his last lites were unremarkable. Hepatic panel was unremarkable. CBC showed WBC 12.2.  Urinalysis showed 6-30 WBC with glycosuria. CT angiogram of the chest was negative for PE, edema, or infiltrates. CT of the abdomen and pelvis was negative for any acute findings. The patient was started on IV fluids and ceftriaxone. EDP consult to neurology who recommended admission for IV antibiotics and hydration. If the patient's symptoms are not improved, then MRI of the brain and cervical spine with contrast would be recommended.  Assessment/Plan: Urinary retention/AKI -Likely a component of obstructive uropathy as well as volume depletion  -Foley catheter placed in the ED draining >800cc upon placement  -Neurology consulted by EDP--concerned this may represent MS flare--> recommended hydration to improve renal function such that MRI of the brain and cervical spine can be obtained to rule out acute MS flare  -Continue ceftriaxone as there was mild pyuria  -Follow up urine culture  -BMP in the morning  -renal US -continue IVF x 24 hours  Chronic Abdominal pain -The patient has chronic abdominal pain -He has had an extensive evaluation including colonoscopy, MRCP, and multiple CTs of his abdomen and pelvis which all have been unremarkable -Partly due to urinary retention as well as constipation -  Start cathartics -Minimize opioids -11/15/16-CT abd/pelvis--neg for acute findings  Dyspnea -Etiology unclear -CTangio chest negative for PE, infiltrates, edema -There is no tachypnea,  and the patient is saturating 96-99 percent on room air -Monitor clinically as the patient is not in any distress -May be a degree of COPD, but no wheezing  Cluster headache/chronic pain syndrome -The patient has some opioid seeking behavior -Avoiding IV opiates if possible -Follows Dr. Vear Clock for pain management -Continue home dose Percocet -Start MiraLAX and Senokot -continue keppra, topamax  History of cannabis and cocaine use -Urine drug screen  Multiple sclerosis -follows Dr. Stoney Bang -continue home meds -MRI brain and C-spine if not improvement with antibiotics  Hx DVT LUE -occurred in 2011 -unclear why pt is still on coumadin -no hypercoagulable workup in medical record  Chronic Hep C -follows Dr. Drue Second -continue Epclusa  Hyperglycemia with glycosuria -HbA1C  HTN -           Past Medical History:  Diagnosis Date  . Abdominal pain, unspecified site   . Anxiety   . Arthritis   . Benign paroxysmal positional vertigo   . Chronic back pain   . Chronic pain syndrome 01/25/2008  . Cluster headache   . Depression    takes Zoloft daily  . DVT (deep venous thrombosis) (HCC)    in the left arm '09  . Gait abnormality    "uses mobile wheelchair, but is ambulatory"  . Gallstones 02/17/2009   resolved after gallbladder surgery.  [name] GERD (gastroesophageal reflux disease)    takes Omeprazole as needed  . Headache(784.0)    cluster headaches frequently-takes Topamax daily  . History of colonoscopy   . HTN (hypertension)    takes Lisinopril,Verapamil,and Triamterene HCTZ daily  . Insomnia 11/06/2008  . Joint pain   . Joint swelling    03-07-16 "swelling of right wrist" "after a fall-xray done 03-06-16 "no fractures".  . Memory loss    no an issue at present 03-07-16  . Multiple sclerosis (HCC)    Dx. 2005 - Dr. Tinnie Gens follows LOV 4'17 tx. Tysabri monthly IV-[facility] , [place].  [name] Nonspecific elevation of levels of transaminase or lactic  acid dehydrogenase (LDH)   . Other specified visual disturbances   . Other syndromes affecting cervical region   . Pneumonia 2009  . Trigeminal neuralgia     history" Multiple sclerosis"   Past Surgical History:  Procedure Laterality Date  . CHOLECYSTECTOMY  02/20/2009  . COLONOSCOPY WITH PROPOFOL N/A 03/17/2016   Procedure: COLONOSCOPY WITH PROPOFOL;  Surgeon: [name], MD;  Location: [facility] ENDOSCOPY;  Service: Endoscopy;  Laterality: N/A;  . PORT A CATH REVISION N/A 07/06/2015   Procedure: Removal and replacement of PORT A CATH;  Surgeon: [name], MD;  Location: [facility] OR;  Service: General;  Laterality: N/A;  . PORTACATH PLACEMENT N/A 03/27/2014   Procedure: INSERTION PORT-A-CATH;  Surgeon: [name], MD;  Location: [facility] OR;  Service: General;  Laterality: N/A;   Social History:  reports that he has been smoking Cigarettes.  He has a 38.00 pack-year smoking history. He has never used smokeless tobacco. He reports that he drinks alcohol. He reports that he does not use drugs.   Family History  Problem Relation Age of Onset  . Cancer Father   . Diabetes Mother      No Known Allergies   Prior to Admission medications   Medication Sig Start Date End Date Taking? Authorizing Provider  baclofen (LIORESAL) 20 MG tablet Take  20 mg by mouth 4 (four) times daily as needed for muscle spasms.    Yes Historical Provider, MD  dalfampridine (AMPYRA) 10 MG TB12 Take 10 mg by mouth 2 (two) times daily.   Yes Historical Provider, MD  Eszopiclone (ESZOPICLONE) 3 MG TABS Take 3 mg by mouth at bedtime as needed (for sleep).    Yes Historical Provider, MD  gabapentin (NEURONTIN) 600 MG tablet Take 600 mg by mouth 4 (four) times daily.    Yes Historical Provider, MD  hydrochlorothiazide (HYDRODIURIL) 25 MG tablet Take 25 mg by mouth daily. 11/06/16  Yes Historical Provider, MD  levETIRAcetam (KEPPRA) 500 MG tablet Take 500 mg by mouth 2 (two) times daily.   Yes Historical Provider, MD    oxyCODONE-acetaminophen (PERCOCET) 5-325 MG tablet Take 1 tablet by mouth every 4 (four) hours as needed for severe pain. 11/10/16  Yes Mancel Bale, MD  sertraline (ZOLOFT) 50 MG tablet Take 50 mg by mouth daily.    Yes Historical Provider, MD  Sofosbuvir-Velpatasvir (EPCLUSA) 400-100 MG TABS Take 1 tablet by mouth daily. 09/11/16  Yes Judyann Munson, MD  topiramate (TOPAMAX) 50 MG tablet Take 50 mg by mouth 2 (two) times daily. 02/18/16  Yes Historical Provider, MD  triamterene-hydrochlorothiazide (DYAZIDE) 37.5-25 MG capsule  11/06/16  Yes Historical Provider, MD  verapamil (CALAN) 80 MG tablet Take 0.5 tablets (40 mg total) by mouth 2 (two) times daily. 02/17/16  Yes Vassie Loll, MD  warfarin (COUMADIN) 5 MG tablet Take 5 mg by mouth daily. 09/29/16  Yes Historical Provider, MD  omeprazole (PRILOSEC) 40 MG capsule Take 40 mg by mouth daily. 10/20/16   Historical Provider, MD  promethazine (PHENERGAN) 25 MG tablet Take 1 tablet (25 mg total) by mouth every 6 (six) hours as needed (headache). 11/15/16   Gilda Crease, MD    Review of Systems:  Constitutional:  No weight loss, night sweats, Fevers, chills, fatigue.  Head&Eyes: No headache.  No vision loss.  No eye pain or scotoma ENT:  No Difficulty swallowing,Tooth/dental problems,Sore throat,  No ear ache, post nasal drip,  Cardio-vascular:  No chest pain, Orthopnea, PND, swelling in lower extremities,  dizziness, palpitations  GI:  No   nausea, vomiting, diarrhea, loss of appetite, hematochezia, melena, heartburn, indigestion, Resp:  No  No cough. No coughing up of blood .No wheezing.No chest wall deformity  Skin:  no rash or lesions.  GU:  no change in color of urine, no urgency or frequency. No flank pain.  Musculoskeletal:  No joint pain or swelling. No decreased range of motion. Psych:  No change in mood or affect. Neurologic: No headache, no dysesthesia, no focal weakness, no vision loss. No syncope  Physical  Exam: Vitals:   11/15/16 0244 11/15/16 0500 11/15/16 0553 11/15/16 0808  BP: 113/77 134/87 129/85 137/85  Pulse: 86 78 86 101  Resp: 18  20 17   Temp:   97.5 F (36.4 C)   TempSrc:   Oral   SpO2: 96% 97% 95% 99%  Weight:      Height:       General:  A&O x 3, NAD, nontoxic, pleasant/cooperative Head/Eye: No conjunctival hemorrhage, no icterus, [place]/AT, No nystagmus ENT:  No icterus,  No thrush, good dentition, no pharyngeal exudate; no meningismus Neck:  No masses, no lymphadenpathy, no bruits CV:  RRR, no rub, no gallop, no S3 Lung:  Bibasilar crackles. No wheezing. Good air movement.  Abdomen: soft/NT, +BS, nondistended, no peritoneal signs Ext: No cyanosis, No rashes,  No petechiae, No lymphangitis, No edema Neuro: CNII-XII intact,  no dysmetria  Labs on Admission:  Basic Metabolic Panel:  Recent Labs Lab 11/10/16 0923 11/14/16 2350  NA 137 145  K 3.8 3.8  CL 102 112*  CO2 27 23  GLUCOSE 111* 151*  BUN 18 41*  CREATININE 0.79 1.67*  CALCIUM 8.8* 9.3   Liver Function Tests:  Recent Labs Lab 11/14/16 2350  AST 27  ALT 29  ALKPHOS 100  BILITOT 0.4  PROT 9.0*  ALBUMIN 4.9    Recent Labs Lab 11/14/16 2350  LIPASE 18   No results for input(s): AMMONIA in the last 168 hours. CBC:  Recent Labs Lab 11/10/16 0923 11/14/16 2350  WBC 8.0 12.2*  NEUTROABS 3.0  --   HGB 13.4 15.5  HCT 40.1 47.2  MCV 79.6 80.4  PLT 257 362   Coagulation Profile: No results for input(s): INR, PROTIME in the last 168 hours. Cardiac Enzymes: No results for input(s): CKTOTAL, CKMB, CKMBINDEX, TROPONINI in the last 168 hours. BNP: Invalid input(s): POCBNP CBG: No results for input(s): GLUCAP in the last 168 hours. Urine analysis:    Component Value Date/Time   COLORURINE YELLOW 11/14/2016 2020   APPEARANCEUR CLEAR 11/14/2016 2020   LABSPEC 1.015 11/14/2016 2020   PHURINE 5.0 11/14/2016 2020   GLUCOSEU 50 (A) 11/14/2016 2020   HGBUR NEGATIVE 11/14/2016 2020    BILIRUBINUR NEGATIVE 11/14/2016 2020   KETONESUR NEGATIVE 11/14/2016 2020   PROTEINUR 30 (A) 11/14/2016 2020   UROBILINOGEN 1.0 05/14/2014 2113   NITRITE NEGATIVE 11/14/2016 2020   LEUKOCYTESUR NEGATIVE 11/14/2016 2020   Sepsis Labs: @LABRCNTIP (procalcitonin:4,lacticidven:4) )No results found for this or any previous visit (from the past 240 hour(s)).   Radiological Exams on Admission: Ct Abdomen Pelvis Wo Contrast  Result Date: 11/15/2016 CLINICAL DATA:  New onset generalized abdominal pain EXAM: CT ABDOMEN AND PELVIS WITHOUT CONTRAST TECHNIQUE: Multidetector CT imaging of the abdomen and pelvis was performed following the standard protocol without IV contrast. COMPARISON:  05/14/2014 FINDINGS: Lower chest: No acute abnormality. Hepatobiliary: No focal liver abnormality is seen. Status post cholecystectomy. No biliary dilatation. Pancreas: Unremarkable. No pancreatic ductal dilatation or surrounding inflammatory changes. Spleen: Normal in size without focal abnormality. Adrenals/Urinary Tract: Adrenal glands are unremarkable. Kidneys are normal, without renal calculi, focal lesion, or hydronephrosis. Bladder is decompressed around a Foley catheter. Stomach/Bowel: Stomach is within normal limits. Appendix is normal. No evidence of bowel wall thickening, distention, or inflammatory changes. Vascular/Lymphatic: The abdominal aorta is normal in caliber. Mild atherosclerotic calcification. No adenopathy in the abdomen or pelvis. Reproductive: Unremarkable Other: No acute inflammation.  No ascites. Musculoskeletal: No significant skeletal lesion. IMPRESSION: No significant abnormality Electronically Signed   By: Ellery Plunk M.D.   On: 11/15/2016 06:25   Ct Angio Chest Pe W Or Wo Contrast  Result Date: 11/15/2016 CLINICAL DATA:  New shortness of breath with history of pulmonary embolus. EXAM: CT ANGIOGRAPHY CHEST WITH CONTRAST TECHNIQUE: Multidetector CT imaging of the chest was performed using the  standard protocol during bolus administration of intravenous contrast. Multiplanar CT image reconstructions and MIPs were obtained to evaluate the vascular anatomy. CONTRAST:  75 mL of Isovue 370 intravenous contrast. COMPARISON:  Chest CT, 02/16/2016.  Current abdomen pelvis CT. FINDINGS: Cardiovascular: Satisfactory opacification of the pulmonary arteries to the segmental level. No evidence of pulmonary embolism. Normal heart size. No pericardial effusion. No coronary artery calcifications. Great vessels are normal in caliber. No aortic dissection. No significant atherosclerotic plaque. Mediastinum/Nodes: No enlarged  mediastinal, hilar, or axillary lymph nodes. Thyroid gland, trachea, and esophagus demonstrate no significant findings. Lungs/Pleura: Mild atelectasis in the left lower lobe with minimal linear atelectasis in the right lung base in the right lower lobe. Mild paraseptal emphysema in the medial right upper lobe at the apex. Lungs otherwise clear. No pleural effusion. No pneumothorax. Upper Abdomen: No acute abnormality. Musculoskeletal: No acute abnormality. 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 No osteoblastic or osteolytic lesions. Review of the MIP images confirms the above findings. IMPRESSION: 1. No evidence of a pulmonary embolus. 2. No acute findings.  No evidence of pneumonia or pulmonary edema. Electronically Signed   By: Amie Portland M.D.   On: 11/15/2016 08:51   Dg Abd Acute W/chest  Result Date: 11/15/2016 CLINICAL DATA:  Initial evaluation for acute generalized abdominal pain. EXAM: DG ABDOMEN ACUTE W/ 1V CHEST COMPARISON:  Prior MRI from 10/14/2016 as well as previous radiograph from 02/15/2016. FINDINGS: Accessed right Port-A-Cath in place with tip overlying the cavoatrial junction. Cardiac and mediastinal silhouettes within normal limits. Mild elevation left hemidiaphragm. No focal infiltrates. No pulmonary edema or pleural effusion. No pneumothorax.  Bowel gas pattern within normal limits without evidence for obstruction or ileus. Mild gaseous distension of the stomach. No abnormal bowel wall thickening. Moderate amount of retained stool within the colon. No soft tissue mass. Patient is status post cholecystectomy. Scattered vascular calcifications noted. Degenerative changes noted within the spine. IMPRESSION: 1. Mild gaseous distension of the stomach. 2. Otherwise nonobstructive bowel gas pattern with no radiographic evidence for acute intra-abdominal process. 3. Moderate amount of retained stool throughout the colon. 4. Mild elevation left hemidiaphragm. No other active cardiopulmonary disease identified. Electronically Signed   By: Rise Mu M.D.   On: 11/15/2016 00:25        Time spent:60 minutes Code Status:  FULL Family Communication:  No Family at bedside Disposition Plan: expect 2-3 day hospitalization Consults called: Neurology by ED DVT Prophylaxis: warfarin  Cale Decarolis, DO  [facility] Pager [phone]  If 7PM-7AM, please contact night-coverage [email or web address] Password [email or web address] 11/15/2016, 10:06 AM

## 2016-11-15 NOTE — Progress Notes (Signed)
ANTICOAGULATION CONSULT NOTE - Initial Consult  Pharmacy Consult for Warfarin Indication: Hx DVT  No Known Allergies  Patient Measurements: Height: 5\' 9"  (175.3 cm) Weight: 196 lb (88.9 kg) IBW/kg (Calculated) : 70.7  Vital Signs: Temp: 98 F (36.7 C) (01/06 1316) Temp Source: Oral (01/06 1316) BP: 139/75 (01/06 1316) Pulse Rate: 88 (01/06 1316)  Labs:  Recent Labs  11/14/16 2350 11/15/16 0921  HGB 15.5  --   HCT 47.2  --   PLT 362  --   LABPROT  --  66.4*  INR  --  7.53*  CREATININE 1.67*  --     Estimated Creatinine Clearance: 54.5 mL/min (by C-G formula based on SCr of 1.67 mg/dL (H)).   Medical History: Past Medical History:  Diagnosis Date  . Abdominal pain, unspecified site   . Anxiety   . Arthritis   . Benign paroxysmal positional vertigo   . Chronic back pain   . Chronic pain syndrome 01/25/2008  . Cluster headache   . Depression    takes Zoloft daily  . DVT (deep venous thrombosis) (HCC)    in the left arm '09  . Gait abnormality    "uses mobile wheelchair, but is ambulatory"  . Gallstones 02/17/2009   resolved after gallbladder surgery.  [name] GERD (gastroesophageal reflux disease)    takes Omeprazole as needed  . Headache(784.0)    cluster headaches frequently-takes Topamax daily  . History of colonoscopy   . HTN (hypertension)    takes Lisinopril,Verapamil,and Triamterene HCTZ daily  . Insomnia 11/06/2008  . Joint pain   . Joint swelling    03-07-16 "swelling of right wrist" "after a fall-xray done 03-06-16 "no fractures".  . Memory loss    no an issue at present 03-07-16  . Multiple sclerosis (HCC)    Dx. 2005 - Dr. Tinnie Gens follows LOV 4'17 tx. Tysabri monthly IV-[facility] , [place].  [name] Nonspecific elevation of levels of transaminase or lactic acid dehydrogenase (LDH)   . Other specified visual disturbances   . Other syndromes affecting cervical region   . Pneumonia 2009  . Trigeminal neuralgia     history" Multiple  sclerosis"    Medications:  Prescriptions Prior to Admission  Medication Sig Dispense Refill Last Dose  . baclofen (LIORESAL) 20 MG tablet Take 20 mg by mouth 4 (four) times daily as needed for muscle spasms.    Past Week at Unknown time  . dalfampridine (AMPYRA) 10 MG TB12 Take 10 mg by mouth 2 (two) times daily.   Past Week at Unknown time  . Eszopiclone (ESZOPICLONE) 3 MG TABS Take 3 mg by mouth at bedtime as needed (for sleep).    Past Month at Unknown time  . gabapentin (NEURONTIN) 600 MG tablet Take 600 mg by mouth 4 (four) times daily.    Past Week at Unknown time  . hydrochlorothiazide (HYDRODIURIL) 25 MG tablet Take 25 mg by mouth daily.  0 Past Week at Unknown time  . levETIRAcetam (KEPPRA) 500 MG tablet Take 500 mg by mouth 2 (two) times daily.  5 Past Week at Unknown time  . oxyCODONE-acetaminophen (PERCOCET) 5-325 MG tablet Take 1 tablet by mouth every 4 (four) hours as needed for severe pain. 20 tablet 0 Past Week at Unknown time  . sertraline (ZOLOFT) 50 MG tablet Take 50 mg by mouth daily.    Past Week at Unknown time  . Sofosbuvir-Velpatasvir (EPCLUSA) 400-100 MG TABS Take 1 tablet by mouth daily. 30 tablet 2 Past Week  at Unknown time  . topiramate (TOPAMAX) 50 MG tablet Take 50 mg by mouth 2 (two) times daily.  5 Past Week at 10am  . triamterene-hydrochlorothiazide (DYAZIDE) 37.5-25 MG capsule    Past Week at Unknown time  . verapamil (CALAN) 80 MG tablet Take 0.5 tablets (40 mg total) by mouth 2 (two) times daily.   Past Week at Unknown time  . warfarin (COUMADIN) 5 MG tablet Take 5 mg by mouth daily.   11/14/2016 at 10am  . omeprazole (PRILOSEC) 40 MG capsule Take 40 mg by mouth daily.  4 unknown   Scheduled:  . [START ON 11/16/2016] cefTRIAXone (ROCEPHIN)  IV  1 g Intravenous Q24H  . dalfampridine  10 mg Oral BID  . gabapentin  600 mg Oral QID  . iopamidol      . levETIRAcetam  500 mg Oral BID  . pantoprazole  80 mg Oral Daily  . phytonadione  2.5 mg Oral Once  .  polyethylene glycol  17 g Oral Daily  . senna  2 tablet Oral Daily  . sertraline  50 mg Oral Daily  . Sofosbuvir-Velpatasvir  1 tablet Oral Daily  . topiramate  50 mg Oral BID  . verapamil  40 mg Oral BID    Assessment: 45 yoM with PMH MS, HTN, chronic HepC, remote LUE DVT, presents with abd pain, headache, and dyspnea x 2 days. Unclear why patient remains on warfarin, but pharmacy to continue while admitted, pending further workup.   Baseline INR grossly elevated, 2.5 mg Vit K given PO x 1  Prior anticoagulation: warfarin 5 mg daily, last dose 1/5  Significant events:  Today, 11/15/2016:  CBC: wnl  INR supratherapeutic  Major drug interactions: Epclusa for HepC may lower INR  No bleeding issues per nursing  Eating 100% of meals  Goal of Therapy: INR 2-3  Plan:  Hold warfarin today  Daily INR  CBC at least q72 hr while on warfarin  Monitor for signs of bleeding or thrombosis   Bernadene Person, PharmD Pager: [phone] 11/15/2016, 3:29 PM

## 2016-11-15 NOTE — ED Provider Notes (Signed)
Assumed care from Dr. Blinda Leatherwood at 8 AM. Briefly, the patient is a 56 yo M with MS, bed bound, here with acute urinary retention, chest pain, multiple complaints. Bladder scan with >800 in bladder, resolved with foley. CT o/w neg. Pt then c/o CP so CT pending.   Labs Reviewed  COMPREHENSIVE METABOLIC PANEL - Abnormal; Notable for the following:       Result Value   Chloride 112 (*)    Glucose, Bld 151 (*)    BUN 41 (*)    Creatinine, Ser 1.67 (*)    Total Protein 9.0 (*)    GFR calc non Af Amer 44 (*)    GFR calc Af Amer 51 (*)    All other components within normal limits  CBC - Abnormal; Notable for the following:    WBC 12.2 (*)    RBC 5.87 (*)    RDW 15.7 (*)    All other components within normal limits  URINALYSIS, ROUTINE W REFLEX MICROSCOPIC - Abnormal; Notable for the following:    Glucose, UA 50 (*)    Protein, ur 30 (*)    Bacteria, UA RARE (*)    All other components within normal limits  PROTIME-INR - Abnormal; Notable for the following:    Prothrombin Time 66.4 (*)    INR 7.53 (*)    All other components within normal limits  URINE CULTURE  LIPASE, BLOOD  RAPID URINE DRUG SCREEN, HOSP PERFORMED  BASIC METABOLIC PANEL  CBC  HEMOGLOBIN A1C  PROTIME-INR    Course of Care: -CTA chest neg. Labs reviewed. Of note, patient has significant AKI, +UTI. D/w Neuro - given new retention, will admit for IVF, Rocephin, MR C/T spine if AKI resolves.  Clinical Impression: 1. Generalized abdominal pain   2. Chronic cluster headache, not intractable   3. Urinary retention   4. Shortness of breath   5. AKI (acute kidney injury) (HCC)     Disposition: Admit to Medicine      Shaune Pollack, MD 11/15/16 1800

## 2016-11-15 NOTE — Progress Notes (Signed)
Critical INR result of 7.53 received from lab. Dr. Arbutus Leas on floor and made aware. No new orders given. Pt with no signs and symptoms of bleeding at the moment. VWilliams,rn.

## 2016-11-16 ENCOUNTER — Inpatient Hospital Stay (HOSPITAL_COMMUNITY): Payer: Medicare HMO

## 2016-11-16 LAB — BASIC METABOLIC PANEL
Anion gap: 6 (ref 5–15)
BUN: 20 mg/dL (ref 6–20)
CALCIUM: 8.7 mg/dL — AB (ref 8.9–10.3)
CHLORIDE: 108 mmol/L (ref 101–111)
CO2: 26 mmol/L (ref 22–32)
CREATININE: 0.78 mg/dL (ref 0.61–1.24)
GFR calc non Af Amer: >60 mL/min (ref >60)
Glucose, Bld: 98 mg/dL (ref 65–99)
Potassium: 3.6 mmol/L (ref 3.5–5.1)
Sodium: 140 mmol/L (ref 135–145)

## 2016-11-16 LAB — CBC
HCT: 38.1 % — ABNORMAL LOW (ref 39.0–52.0)
Hemoglobin: 12.4 g/dL — ABNORMAL LOW (ref 13.0–17.0)
MCH: 26.2 pg (ref 26.0–34.0)
MCHC: 32.5 g/dL (ref 30.0–36.0)
MCV: 80.4 fL (ref 78.0–100.0)
PLATELETS: 306 10*3/uL (ref 150–400)
RBC: 4.74 MIL/uL (ref 4.22–5.81)
RDW: 15.4 % (ref 11.5–15.5)
WBC: 11.1 10*3/uL — ABNORMAL HIGH (ref 4.0–10.5)

## 2016-11-16 LAB — URINE CULTURE: Culture: NO GROWTH

## 2016-11-16 LAB — PROTIME-INR
INR: 2.15
Prothrombin Time: 24.4 seconds — ABNORMAL HIGH (ref 11.4–15.2)

## 2016-11-16 MED ORDER — WARFARIN - PHARMACIST DOSING INPATIENT: Freq: Every day | Status: DC

## 2016-11-16 MED ORDER — SODIUM CHLORIDE 0.9% FLUSH
10.0000 mL | Freq: Two times a day (BID) | INTRAVENOUS | Status: DC
  Administered 2016-11-17 – 2016-11-21 (×5): 10 mL

## 2016-11-16 MED ORDER — WARFARIN SODIUM 5 MG PO TABS
5.0000 mg | ORAL_TABLET | Freq: Once | ORAL | Status: AC
  Administered 2016-11-16: 17:00:00 5 mg via ORAL
  Filled 2016-11-16: qty 1

## 2016-11-16 MED ORDER — SODIUM CHLORIDE 0.9% FLUSH
10.0000 mL | INTRAVENOUS | Status: DC | PRN
Start: 2016-11-16 — End: 2016-11-21
  Administered 2016-11-18 – 2016-11-20 (×3): 10 mL
  Filled 2016-11-16 (×3): qty 40

## 2016-11-16 NOTE — Progress Notes (Signed)
Pt came down to MRI for brain and c-spine with and without. Pt refused any more imaging due to loudness of the scanner. Completing out mri of the brain without contrast. Per pt. He would not continue any further.

## 2016-11-16 NOTE — Progress Notes (Signed)
ANTICOAGULATION CONSULT NOTE  Pharmacy Consult for Warfarin Indication: Hx DVT  No Known Allergies  Patient Measurements: Height: 5\' 9"  (175.3 cm) Weight: 196 lb (88.9 kg) IBW/kg (Calculated) : 70.7  Vital Signs: Temp: 98.2 F (36.8 C) (01/07 0500) Temp Source: Oral (01/07 0500) BP: 121/72 (01/07 0500) Pulse Rate: 79 (01/07 0500)  Labs:  Recent Labs  11/14/16 2350 11/15/16 0921 11/16/16 0430  HGB 15.5  --  12.4*  HCT 47.2  --  38.1*  PLT 362  --  306  LABPROT  --  66.4* 24.4*  INR  --  7.53* 2.15  CREATININE 1.67*  --  0.78   Estimated Creatinine Clearance: 113.8 mL/min (by C-G formula based on SCr of 0.78 mg/dL).  Medical History: Past Medical History:  Diagnosis Date  . Abdominal pain, unspecified site   . Anxiety   . Arthritis   . Benign paroxysmal positional vertigo   . Chronic back pain   . Chronic pain syndrome 01/25/2008  . Cluster headache   . Depression    takes Zoloft daily  . DVT (deep venous thrombosis) (HCC)    in the left arm '09  . Gait abnormality    "uses mobile wheelchair, but is ambulatory"  . Gallstones 02/17/2009   resolved after gallbladder surgery.  [name] GERD (gastroesophageal reflux disease)    takes Omeprazole as needed  . Headache(784.0)    cluster headaches frequently-takes Topamax daily  . History of colonoscopy   . HTN (hypertension)    takes Lisinopril,Verapamil,and Triamterene HCTZ daily  . Insomnia 11/06/2008  . Joint pain   . Joint swelling    03-07-16 "swelling of right wrist" "after a fall-xray done 03-06-16 "no fractures".  . Memory loss    no an issue at present 03-07-16  . Multiple sclerosis (HCC)    Dx. 2005 - Dr. Tinnie Gens follows LOV 4'17 tx. Tysabri monthly IV-[facility] , [place].  [name] Nonspecific elevation of levels of transaminase or lactic acid dehydrogenase (LDH)   . Other specified visual disturbances   . Other syndromes affecting cervical region   . Pneumonia 2009  . Trigeminal neuralgia    history" Multiple sclerosis"   Medicatio Scheduled:  . cefTRIAXone (ROCEPHIN)  IV  1 g Intravenous Q24H  . dalfampridine  10 mg Oral BID  . gabapentin  600 mg Oral QID  . levETIRAcetam  500 mg Oral BID  . pantoprazole  80 mg Oral Daily  . polyethylene glycol  17 g Oral Daily  . senna  2 tablet Oral Daily  . sertraline  50 mg Oral Daily  . sodium chloride flush  10-40 mL Intracatheter Q12H  . Sofosbuvir-Velpatasvir  1 tablet Oral Daily  . topiramate  50 mg Oral BID  . verapamil  40 mg Oral BID    Assessment: 44 yoM with PMH MS, HTN, chronic HepC, remote LUE DVT, presents with abd pain, headache, and dyspnea x 2 days. Unclear why patient remains on warfarin, but pharmacy to continue while admitted, pending further workup.   Baseline INR 7.53, Vit K 2.5 mg PO x 1 on 1/6  Prior anticoagulation: warfarin 5 mg daily, last dose 1/5  Today, 11/16/2016:  CBC: decreased, dilutional (1/7)  INR 2.15 this am after po Vit K  Major drug interactions: Epclusa for HepC may lower INR  Abx may increase INR  No bleeding issues per nursing  Eating 100% of meals  Goal of Therapy: INR 2-3  Plan:  Warfarin 5mg  today at 1800  Daily INR  CBC at least q72 hr while on warfarin  Monitor for signs of bleeding or thrombosis  Otho Bellows PharmD Pager [phone] 11/16/2016, 9:01 AM

## 2016-11-16 NOTE — Progress Notes (Signed)
Patient vomited a large amount of undigested food at approximately 2000. Noted chunks of poorly chewed food particles in vomitus. Had Zofran 4 mg IV at approximately 2041 with good effect. Will continue to monitor for further vomiting.

## 2016-11-16 NOTE — Evaluation (Signed)
Physical Therapy Evaluation Patient Details Name: [name] MRN: [id number] DOB: 1961-04-10 Today's Date: 11/16/2016   History of Present Illness  56 yo male admitted with AKI, abd pain, HA. Hx of multiple sclerosis, cluster HAs, DVT, BPPV, chronic pain.  Clinical Impression  On eval, pt required Min guard assist for mobility. He walked ~50 feet with a RW. He tolerated activity fairly well. Will follow. Recommend HHPT follow up if pt decides he wants it.     Follow Up Recommendations Home health PT;Supervision - Intermittent    Equipment Recommendations  None recommended by PT    Recommendations for Other Services       Precautions / Restrictions Precautions Precautions: Fall Restrictions Weight Bearing Restrictions: No      Mobility  Bed Mobility Overal bed mobility: Needs Assistance Bed Mobility: Supine to Sit     Supine to sit: Min guard;HOB elevated     General bed mobility comments: Increased time.   Transfers Overall transfer level: Needs assistance Equipment used: Rolling walker (2 wheeled) Transfers: Sit to/from Stand Sit to Stand: Min guard         General transfer comment: close guard for safety. Multiple attempts for pt to get to standing but no assistance given. Increased time.   Ambulation/Gait Ambulation/Gait assistance: Min guard Ambulation Distance (Feet): 50 Feet Assistive device: Rolling walker (2 wheeled) Gait Pattern/deviations: Step-through pattern;Decreased stride length;Step-to pattern     General Gait Details: slow gait speed. Close guard for safety. Cues for safe use of walker, distance from walker.   Stairs            Wheelchair Mobility    Modified Rankin (Stroke Patients Only)       Balance                                             Pertinent Vitals/Pain Pain Assessment: 0-10 Pain Score: 8  Pain Location: HA, abdomen Pain Descriptors / Indicators: Aching;Sore Pain Intervention(s):  Limited activity within patient's tolerance    Home Living Family/patient expects to be discharged to:: Private residence Living Arrangements: Parent Available Help at Discharge: Family;Friend(s) Type of Home: House Home Access: Ramped entrance     Home Layout: One level Home Equipment: Environmental consultant - 2 wheels;Wheelchair - manual      Prior Function Level of Independence: Needs assistance      ADL's / Homemaking Assistance Needed: has aid 7/days a week        Hand Dominance        Extremity/Trunk Assessment   Upper Extremity Assessment Upper Extremity Assessment: Generalized weakness    Lower Extremity Assessment Lower Extremity Assessment: Generalized weakness    Cervical / Trunk Assessment Cervical / Trunk Assessment: Normal  Communication   Communication: No difficulties  Cognition Arousal/Alertness: Awake/alert Behavior During Therapy: WFL for tasks assessed/performed Overall Cognitive Status: Within Functional Limits for tasks assessed                      General Comments      Exercises     Assessment/Plan    PT Assessment Patient needs continued PT services  PT Problem List Decreased strength;Decreased mobility;Decreased balance;Decreased knowledge of use of DME          PT Treatment Interventions DME instruction;Therapeutic activities;Gait training;Therapeutic exercise;Patient/family education;Functional mobility training;Balance training    PT Goals (Current goals can  be found in the Care Plan section)  Acute Rehab PT Goals Patient Stated Goal: pain improvement PT Goal Formulation: With patient Time For Goal Achievement: 11/30/16 Potential to Achieve Goals: Good    Frequency Min 3X/week   Barriers to discharge        Co-evaluation               End of Session Equipment Utilized During Treatment: Gait belt Activity Tolerance: Patient tolerated treatment well Patient left: in chair;with call bell/phone within reach;with  chair alarm set           Time: 0902-0918 PT Time Calculation (min) (ACUTE ONLY): 16 min   Charges:   PT Evaluation $PT Eval Low Complexity: 1 Procedure     PT G Codes:        Rebeca Alert, MPT Pager: [phone]

## 2016-11-16 NOTE — Progress Notes (Signed)
[facility]  PROGRESS NOTE  [name] JYN:[id number] DOB: Oct 16, 1961 DOA: 11/14/2016 PCP: Dorrene German, MD   Brief HPI:   56 y.o. male with medical history of multiple sclerosis, hypertension, chronic hepatitis C, chronic pain syndrome, DVT left arm, depression presents with 2 day history of abdominal pain, "cluster headache", and dyspnea. The patient normally is able to make transfers but essentially is nonambulatory at baseline. The patient had been complaining of dysuria and difficulty urinating for a period of 2 days with associated abdominal pain that was generalized. He denied any fevers, chills, nausea, vomiting, diarrhea. He normally is able to urinate spontaneously without difficulty. In the emergency department, the patient was found to have urinary retention and a Foley catheter was inserted which drained > 800 cc  of urine.    The patient was started on IV fluids and ceftriaxone. EDP consult to neurology who recommended admission for IV antibiotics and hydration. If the patient's symptoms are not improved, then MRI of the brain and cervical spine with contrast would be recommended.    Subjective   This morning patient continues to have abdominal pain. Foley catheter in place   Assessment/Plan:     1. Urinary retention- patient presented with urinary retention, Foley catheter placed in ED drained more than 800 mL urine. Continue Foley catheter. ED physician consulted neurology and neurology recommended to check MRI of brain and cervical spine with contrast to rule out acute MS flare causing urinary retention. 2. ? UTI- patient was empirically started on ceftriaxone, urine culture showed no growth. We'll discontinue ceftriaxone at this time. 3. Multiple sclerosis- patient had MRI done a month ago which showed no acute changes, will obtain MRI cervical spine and brain with contrast to rule out acute flare of MS as per neuro recommendation. 4. Chronic abdominal  pain-patient has history of chronic abdominal pain.He has had an extensive evaluation including colonoscopy, MRCP, and multiple CTs of his abdomen and pelvis which all have been unremarkable. Patient also complained of constipation, minimize opiates, CT abdomen pelvis was negative for acute findings. Continue Senokot tablets, MiraLAX. 5. ? Dyspnea- patient had questionable dyspnea yesterday, CT injury chest was negative for PE, infiltrates or edema. Patient is not tachypneic. O2 sats 100% on room air. Continue to monitor. 6. Cluster headache/chronic pain syndrome- he follows Dr. Vear Clock for pain management, continue home dose Percocet. Continue Keppra, Topamax. 7. History of DVT left upper extremity- continue Coumadin, last episode was in 2011. 8. Chronic hepatitis C- patient says that he stopped taking Epclusa at home, after discussion with neurologist/PCP. As it was causing side effects of headache. Will discontinue Epclusa.    DVT prophylaxis: Warfarin  Code Status: Full code  Family Communication: *No family present at bedside  Disposition Plan: Pending improvement in abdominal pain, urine culture results, MRI brain and cervical spine   Consultants:  None  Procedures:  None   Antibiotics:   Anti-infectives    Start     Dose/Rate Route Frequency Ordered Stop   11/16/16 0900  cefTRIAXone (ROCEPHIN) 1 g in dextrose 5 % 50 mL IVPB     1 g 100 mL/hr over 30 Minutes Intravenous Every 24 hours 11/15/16 1222     11/15/16 1700  Sofosbuvir-Velpatasvir 400-100 MG TABS 1 tablet  Status:  Discontinued     1 tablet Oral Daily 11/15/16 1222 11/16/16 0950   11/15/16 0915  cefTRIAXone (ROCEPHIN) 1 g in dextrose 5 % 50 mL IVPB     1 g 100 mL/hr  over 30 Minutes Intravenous  Once 11/15/16 0902 11/15/16 1014       Objective   Vitals:   11/15/16 1316 11/15/16 2218 11/16/16 0500 11/16/16 1359  BP: 139/75 132/66 121/72 112/73  Pulse: 88 66 79 88  Resp:  19 18 18   Temp: 98 F (36.7 C)  97.7 F (36.5 C) 98.2 F (36.8 C) 98.4 F (36.9 C)  TempSrc: Oral Oral Oral Oral  SpO2: 100% 99% 99% 100%  Weight:      Height:        Intake/Output Summary (Last 24 hours) at 11/16/16 1615 Last data filed at 11/16/16 1400  Gross per 24 hour  Intake           1466.2 ml  Output             2575 ml  Net          -1108.8 ml   Filed Weights   11/14/16 2020 11/14/16 2021  Weight: 89.8 kg (198 lb) 88.9 kg (196 lb)     Physical Examination:  General exam: Appears calm and comfortable. Respiratory system: Clear to auscultation. Respiratory effort normal. Cardiovascular system:  RRR. No  murmurs, rubs, gallops. No pedal edema. GI system: Abdomen is nondistended, soft and nontender. No organomegaly.  Central nervous system. No focal neurological deficits. 5 x 5 power in all extremities. Skin: No rashes, lesions or ulcers. Psychiatry: Alert, oriented x 3.Judgement and insight appear normal. Affect normal.    Data Reviewed: I have personally reviewed following labs and imaging studies  CBG: No results for input(s): GLUCAP in the last 168 hours.  CBC:  Recent Labs Lab 11/10/16 0923 11/14/16 2350 11/16/16 0430  WBC 8.0 12.2* 11.1*  NEUTROABS 3.0  --   --   HGB 13.4 15.5 12.4*  HCT 40.1 47.2 38.1*  MCV 79.6 80.4 80.4  PLT 257 362 306    Basic Metabolic Panel:  Recent Labs Lab 11/10/16 0923 11/14/16 2350 11/16/16 0430  NA 137 145 140  K 3.8 3.8 3.6  CL 102 112* 108  CO2 27 23 26   GLUCOSE 111* 151* 98  BUN 18 41* 20  CREATININE 0.79 1.67* 0.78  CALCIUM 8.8* 9.3 8.7*    Recent Results (from the past 240 hour(s))  Urine culture     Status: None   Collection Time: 11/14/16 11:50 PM  Result Value Ref Range Status   Specimen Description URINE, CATHETERIZED  Final   Special Requests NONE  Final   Culture NO GROWTH Performed at [facility]   Final   Report Status 11/16/2016 FINAL  Final     Liver Function Tests:  Recent Labs Lab 11/14/16 2350   AST 27  ALT 29  ALKPHOS 100  BILITOT 0.4  PROT 9.0*  ALBUMIN 4.9    Recent Labs Lab 11/14/16 2350  LIPASE 18      Studies: Ct Abdomen Pelvis Wo Contrast  Result Date: 11/15/2016 CLINICAL DATA:  New onset generalized abdominal pain EXAM: CT ABDOMEN AND PELVIS WITHOUT CONTRAST TECHNIQUE: Multidetector CT imaging of the abdomen and pelvis was performed following the standard protocol without IV contrast. COMPARISON:  05/14/2014 FINDINGS: Lower chest: No acute abnormality. Hepatobiliary: No focal liver abnormality is seen. Status post cholecystectomy. No biliary dilatation. Pancreas: Unremarkable. No pancreatic ductal dilatation or surrounding inflammatory changes. Spleen: Normal in size without focal abnormality. Adrenals/Urinary Tract: Adrenal glands are unremarkable. Kidneys are normal, without renal calculi, focal lesion, or hydronephrosis. Bladder is decompressed around a Foley catheter.  Stomach/Bowel: Stomach is within normal limits. Appendix is normal. No evidence of bowel wall thickening, distention, or inflammatory changes. Vascular/Lymphatic: The abdominal aorta is normal in caliber. Mild atherosclerotic calcification. No adenopathy in the abdomen or pelvis. Reproductive: Unremarkable Other: No acute inflammation.  No ascites. Musculoskeletal: No significant skeletal lesion. IMPRESSION: No significant abnormality Electronically Signed   By: Ellery Plunk M.D.   On: 11/15/2016 06:25   Ct Angio Chest Pe W Or Wo Contrast  Result Date: 11/15/2016 CLINICAL DATA:  New shortness of breath with history of pulmonary embolus. EXAM: CT ANGIOGRAPHY CHEST WITH CONTRAST TECHNIQUE: Multidetector CT imaging of the chest was performed using the standard protocol during bolus administration of intravenous contrast. Multiplanar CT image reconstructions and MIPs were obtained to evaluate the vascular anatomy. CONTRAST:  75 mL of Isovue 370 intravenous contrast. COMPARISON:  Chest CT, 02/16/2016.   Current abdomen pelvis CT. FINDINGS: Cardiovascular: Satisfactory opacification of the pulmonary arteries to the segmental level. No evidence of pulmonary embolism. Normal heart size. No pericardial effusion. No coronary artery calcifications. Great vessels are normal in caliber. No aortic dissection. No significant atherosclerotic plaque. Mediastinum/Nodes: No enlarged mediastinal, hilar, or axillary lymph nodes. Thyroid gland, trachea, and esophagus demonstrate no significant findings. Lungs/Pleura: Mild atelectasis in the left lower lobe with minimal linear atelectasis in the right lung base in the right lower lobe. Mild paraseptal emphysema in the medial right upper lobe at the apex. Lungs otherwise clear. No pleural effusion. No pneumothorax. Upper Abdomen: No acute abnormality. Musculoskeletal: No acute abnormality. 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 No osteoblastic or osteolytic lesions. Review of the MIP images confirms the above findings. IMPRESSION: 1. No evidence of a pulmonary embolus. 2. No acute findings.  No evidence of pneumonia or pulmonary edema. Electronically Signed   By: Amie Portland M.D.   On: 11/15/2016 08:51   US Renal  Result Date: 11/15/2016 CLINICAL DATA:  Acute renal injury EXAM: RENAL / URINARY TRACT ULTRASOUND COMPLETE COMPARISON:  CT scan from yesterday FINDINGS: Right Kidney: Length: 13.5 cm.  1.4 cm cyst in the right kidney. Left Kidney: Length: 13.8 cm. Echogenicity within normal limits. No mass or hydronephrosis visualized. Bladder: The bladder is decompressed with a Foley catheter. IMPRESSION: No acute abnormalities identified.  Right renal cyst. Electronically Signed   By: Gerome Sam III M.D   On: 11/15/2016 12:59   Dg Abd Acute W/chest  Result Date: 11/15/2016 CLINICAL DATA:  Initial evaluation for acute generalized abdominal pain. EXAM: DG ABDOMEN ACUTE W/ 1V CHEST COMPARISON:  Prior MRI from 10/14/2016 as well as previous  radiograph from 02/15/2016. FINDINGS: Accessed right Port-A-Cath in place with tip overlying the cavoatrial junction. Cardiac and mediastinal silhouettes within normal limits. Mild elevation left hemidiaphragm. No focal infiltrates. No pulmonary edema or pleural effusion. No pneumothorax. Bowel gas pattern within normal limits without evidence for obstruction or ileus. Mild gaseous distension of the stomach. No abnormal bowel wall thickening. Moderate amount of retained stool within the colon. No soft tissue mass. Patient is status post cholecystectomy. Scattered vascular calcifications noted. Degenerative changes noted within the spine. IMPRESSION: 1. Mild gaseous distension of the stomach. 2. Otherwise nonobstructive bowel gas pattern with no radiographic evidence for acute intra-abdominal process. 3. Moderate amount of retained stool throughout the colon. 4. Mild elevation left hemidiaphragm. No other active cardiopulmonary disease identified. Electronically Signed   By: Rise Mu M.D.   On: 11/15/2016 00:25    Scheduled Meds: . cefTRIAXone (ROCEPHIN)  IV  1 g Intravenous Q24H  . dalfampridine  10 mg Oral BID  . gabapentin  600 mg Oral QID  . levETIRAcetam  500 mg Oral BID  . pantoprazole  80 mg Oral Daily  . polyethylene glycol  17 g Oral Daily  . senna  2 tablet Oral Daily  . sertraline  50 mg Oral Daily  . sodium chloride flush  10-40 mL Intracatheter Q12H  . topiramate  50 mg Oral BID  . verapamil  40 mg Oral BID  . warfarin  5 mg Oral ONCE-1800  . Warfarin - Pharmacist Dosing Inpatient   Does not apply q1800      Time spent: 25 min  [facility]   [facility] Pager [phone]. If 7PM-7AM, please contact night-coverage at [email or web address], Office  [phone]  password [email or web address] 11/16/2016, 4:15 PM  LOS: 1 day

## 2016-11-17 ENCOUNTER — Inpatient Hospital Stay: Payer: Medicare HMO

## 2016-11-17 ENCOUNTER — Inpatient Hospital Stay (HOSPITAL_COMMUNITY): Payer: Medicare HMO

## 2016-11-17 LAB — HEMOGLOBIN A1C
HEMOGLOBIN A1C: 5.9 % — AB (ref 4.8–5.6)
MEAN PLASMA GLUCOSE: 123 mg/dL

## 2016-11-17 LAB — PROTIME-INR
INR: 1.27
Prothrombin Time: 15.9 seconds — ABNORMAL HIGH (ref 11.4–15.2)

## 2016-11-17 MED ORDER — WARFARIN SODIUM 5 MG PO TABS
5.0000 mg | ORAL_TABLET | Freq: Once | ORAL | Status: AC
  Administered 2016-11-17: 19:00:00 5 mg via ORAL
  Filled 2016-11-17: qty 1

## 2016-11-17 MED ORDER — LORAZEPAM 2 MG/ML IJ SOLN
2.0000 mg | Freq: Once | INTRAMUSCULAR | Status: AC
  Administered 2016-11-17: 2 mg via INTRAVENOUS
  Filled 2016-11-17: qty 1

## 2016-11-17 NOTE — Progress Notes (Signed)
ANTICOAGULATION CONSULT NOTE  Pharmacy Consult for Warfarin Indication: Hx DVT  No Known Allergies  Patient Measurements: Height: 5\' 9"  (175.3 cm) Weight: 196 lb (88.9 kg) IBW/kg (Calculated) : 70.7  Vital Signs: Temp: 98.6 F (37 C) (01/08 0616) Temp Source: Oral (01/08 0616) BP: 115/69 (01/08 1000) Pulse Rate: 103 (01/08 0616)  Labs:  Recent Labs  11/14/16 2350 11/15/16 0921 11/16/16 0430 11/17/16 0415  HGB 15.5  --  12.4*  --   HCT 47.2  --  38.1*  --   PLT 362  --  306  --   LABPROT  --  66.4* 24.4* 15.9*  INR  --  7.53* 2.15 1.27  CREATININE 1.67*  --  0.78  --    Estimated Creatinine Clearance: 113.8 mL/min (by C-G formula based on SCr of 0.78 mg/dL).  Medical History: Past Medical History:  Diagnosis Date  . Abdominal pain, unspecified site   . Anxiety   . Arthritis   . Benign paroxysmal positional vertigo   . Chronic back pain   . Chronic pain syndrome 01/25/2008  . Cluster headache   . Depression    takes Zoloft daily  . DVT (deep venous thrombosis) (HCC)    in the left arm '09  . Gait abnormality    "uses mobile wheelchair, but is ambulatory"  . Gallstones 02/17/2009   resolved after gallbladder surgery.  [name] GERD (gastroesophageal reflux disease)    takes Omeprazole as needed  . Headache(784.0)    cluster headaches frequently-takes Topamax daily  . History of colonoscopy   . HTN (hypertension)    takes Lisinopril,Verapamil,and Triamterene HCTZ daily  . Insomnia 11/06/2008  . Joint pain   . Joint swelling    03-07-16 "swelling of right wrist" "after a fall-xray done 03-06-16 "no fractures".  . Memory loss    no an issue at present 03-07-16  . Multiple sclerosis (HCC)    Dx. 2005 - Dr. Tinnie Gens follows LOV 4'17 tx. Tysabri monthly IV-[facility] , [place].  [name] Nonspecific elevation of levels of transaminase or lactic acid dehydrogenase (LDH)   . Other specified visual disturbances   . Other syndromes affecting cervical region   .  Pneumonia 2009  . Trigeminal neuralgia     history" Multiple sclerosis"   Medicatio Scheduled:  . dalfampridine  10 mg Oral BID  . gabapentin  600 mg Oral QID  . levETIRAcetam  500 mg Oral BID  . pantoprazole  80 mg Oral Daily  . polyethylene glycol  17 g Oral Daily  . senna  2 tablet Oral Daily  . sertraline  50 mg Oral Daily  . sodium chloride flush  10-40 mL Intracatheter Q12H  . topiramate  50 mg Oral BID  . verapamil  40 mg Oral BID  . 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  INR on admission was 7.53 with vitamin K 2.5 mg PO x1 given on 11/16/15.  Patient reported no new medications or significant diet changes PTA.  Home warfarin regimen:  5 mg daily  Today, 11/17/2016:  INR now subtherapeutic at 1.27 with warfarin 5 mg x1 given on 1/7 and vit K 2.5 mg on 1/6  No bleeding documented  No significant drug-drug intxns with current meds (abx d/ced on 1/7)  Heart healthy diet  Goal of Therapy: INR 2-3  Plan:  Repeat Warfarin 5mg  today at 1800  Monitor for signs of bleeding  Dorna Leitz, PharmD, BCPS 11/17/2016 10:52 AM

## 2016-11-17 NOTE — Progress Notes (Signed)
PROGRESS NOTE                                                                                                                                                                                                             Patient Demographics:    [name], is a 56 y.o. male, DOB - 1961-03-07, ZOX:[id number]  Admit date - 11/14/2016   Admitting Physician Catarina Hartshorn, MD  Outpatient Primary MD for the patient is Dorrene German, MD  LOS - 2  Outpatient Specialists: Neurology  Chief Complaint  Patient presents with  . Abdominal Pain       Brief Narrative   56 year old male with multiple sclerosis, chronic hep C, chronic pain, DVT of the left arm, depression and hypertension presented with 2 day history of abdominal pain with cluster headaches and dyspnea. Patient is mostly nonambulatory at baseline but is able to make transfers. Patient also complained of dysuria and difficulty urinating for past 2 days with generalized abdominal pain. In the ED was found to have urinary retention. A Foley catheter was placed which drained >800 mL of urine. ED consulted neurology on the phone who recommended obtaining MRI of the brain and cervical spine with contrast.   Subjective:   Patient still complains of having headaches. Had MRI of the brain yesterday but diffuse cervical spine MRI due to? Increased loud noise   Assessment  & Plan :    Principle problem Acute urinary retention. Foley placed on admission. MRI of the brain without continue findings compared to MRI done one month back. Renal ultrasound unremarkable. Patient agreed on getting MRI of the cervical spine today. Have ordered. Concern for MS flare causing urinary retention. Consulted neurology following MRI results.  ? UTI Received empiric Rocephin. Cultures negative. Discontinued antibiotics.  Acute kidney injury Secondary to obstructive Uropathy. Resolved after  Foley placed in.  Multiple sclerosis with possible flareup. MRI done one month back showed no acute findings. MRI of the brain repeated this admission without any change. Follow MRI of the cervical spine.  Cluster headaches/chronic pain syndrome Follows with Dr. Jamesetta So for pain management. Continue home dose Percocet, Keppra and Topamax.  History of DVT of left upper extremity On chronic Coumadin.  Chronic hep C Patient reported he stopped taking Epclusa at home, after discussion with neurologist/PCP as it worsened his headaches.  Chronic abdominal pain Has had extensive evaluation including MRCP, multiple CTs of the abdomen and pelvis and colonoscopy which were unremarkable. Minimize opiate and continue bowel regimen.    Code Status : Full code  Family Communication  : None at bedside  Disposition Plan  : Home once improved, pending MRI cervical spine  Barriers For Discharge : Active urinary retention. MRI   Consults  :  None  Procedures  : MRI brain  DVT Prophylaxis  :  Coumadin  Lab Results  Component Value Date   PLT 306 11/16/2016    Antibiotics  :    Anti-infectives    Start     Dose/Rate Route Frequency Ordered Stop   11/16/16 0900  cefTRIAXone (ROCEPHIN) 1 g in dextrose 5 % 50 mL IVPB  Status:  Discontinued     1 g 100 mL/hr over 30 Minutes Intravenous Every 24 hours 11/15/16 1222 11/16/16 1625   11/15/16 1700  Sofosbuvir-Velpatasvir 400-100 MG TABS 1 tablet  Status:  Discontinued     1 tablet Oral Daily 11/15/16 1222 11/16/16 0950   11/15/16 0915  cefTRIAXone (ROCEPHIN) 1 g in dextrose 5 % 50 mL IVPB     1 g 100 mL/hr over 30 Minutes Intravenous  Once 11/15/16 0902 11/15/16 1014        Objective:   Vitals:   11/16/16 2153 11/17/16 0616 11/17/16 1000 11/17/16 1400  BP: 115/68 109/64 115/69 116/64  Pulse: (!) 116 (!) 103  86  Resp: 18 18  16   Temp: 98.9 F (37.2 C) 98.6 F (37 C)  97.7 F (36.5 C)  TempSrc: Oral Oral  Oral  SpO2: 97% 95%  98%    Weight:      Height:        Wt Readings from Last 3 Encounters:  11/14/16 88.9 kg (196 lb)  11/10/16 88.9 kg (196 lb)  07/08/16 88.9 kg (196 lb)     Intake/Output Summary (Last 24 hours) at 11/17/16 1522 Last data filed at 11/17/16 1401  Gross per 24 hour  Intake              480 ml  Output             2175 ml  Net            -1695 ml     Physical Exam  Gen: not in distress HEENT: moist mucosa, supple neck Chest: clear b/l, no added sounds CVS: N S1&S2, no murmurs GI: soft, NT, ND, BS+, Foley on admission Musculoskeletal: warm, no edema , CNS: AAOX3, 4/5 power in b/l legs, reports poor sensation b/l    Data Review:    CBC  Recent Labs Lab 11/14/16 2350 11/16/16 0430  WBC 12.2* 11.1*  HGB 15.5 12.4*  HCT 47.2 38.1*  PLT 362 306  MCV 80.4 80.4  MCH 26.4 26.2  MCHC 32.8 32.5  RDW 15.7* 15.4    Chemistries   Recent Labs Lab 11/14/16 2350 11/16/16 0430  NA 145 140  K 3.8 3.6  CL 112* 108  CO2 23 26  GLUCOSE 151* 98  BUN 41* 20  CREATININE 1.67* 0.78  CALCIUM 9.3 8.7*  AST 27  --   ALT 29  --   ALKPHOS 100  --   BILITOT 0.4  --    ------------------------------------------------------------------------------------------------------------------ No results for input(s): CHOL, HDL, LDLCALC, TRIG, CHOLHDL, LDLDIRECT in the last 72 hours.  Lab Results  Component Value Date   HGBA1C 5.9 (H) 11/16/2016   ------------------------------------------------------------------------------------------------------------------  No results for input(s): TSH, T4TOTAL, T3FREE, THYROIDAB in the last 72 hours.  Invalid input(s): FREET3 ------------------------------------------------------------------------------------------------------------------ No results for input(s): VITAMINB12, FOLATE, FERRITIN, TIBC, IRON, RETICCTPCT in the last 72 hours.  Coagulation profile  Recent Labs Lab 11/15/16 0921 11/16/16 0430 11/17/16 0415  INR 7.53* 2.15 1.27    No  results for input(s): DDIMER in the last 72 hours.  Cardiac Enzymes No results for input(s): CKMB, TROPONINI, MYOGLOBIN in the last 168 hours.  Invalid input(s): CK ------------------------------------------------------------------------------------------------------------------    Component Value Date/Time   BNP 8.0 05/28/2015 2140    Inpatient Medications  Scheduled Meds: . dalfampridine  10 mg Oral BID  . gabapentin  600 mg Oral QID  . levETIRAcetam  500 mg Oral BID  . LORazepam  2 mg Intravenous Once  . pantoprazole  80 mg Oral Daily  . polyethylene glycol  17 g Oral Daily  . senna  2 tablet Oral Daily  . sertraline  50 mg Oral Daily  . sodium chloride flush  10-40 mL Intracatheter Q12H  . topiramate  50 mg Oral BID  . verapamil  40 mg Oral BID  . warfarin  5 mg Oral ONCE-1800  . Warfarin - Pharmacist Dosing Inpatient   Does not apply q1800   Continuous Infusions: PRN Meds:.acetaminophen **OR** acetaminophen, baclofen, ondansetron **OR** ondansetron (ZOFRAN) IV, oxyCODONE-acetaminophen, sodium chloride flush, zolpidem  Micro Results Recent Results (from the past 240 hour(s))  Urine culture     Status: None   Collection Time: 11/14/16 11:50 PM  Result Value Ref Range Status   Specimen Description URINE, CATHETERIZED  Final   Special Requests NONE  Final   Culture NO GROWTH Performed at [facility]   Final   Report Status 11/16/2016 FINAL  Final    Radiology Reports Ct Abdomen Pelvis Wo Contrast  Result Date: 11/15/2016 CLINICAL DATA:  New onset generalized abdominal pain EXAM: CT ABDOMEN AND PELVIS WITHOUT CONTRAST TECHNIQUE: Multidetector CT imaging of the abdomen and pelvis was performed following the standard protocol without IV contrast. COMPARISON:  05/14/2014 FINDINGS: Lower chest: No acute abnormality. Hepatobiliary: No focal liver abnormality is seen. Status post cholecystectomy. No biliary dilatation. Pancreas: Unremarkable. No pancreatic ductal  dilatation or surrounding inflammatory changes. Spleen: Normal in size without focal abnormality. Adrenals/Urinary Tract: Adrenal glands are unremarkable. Kidneys are normal, without renal calculi, focal lesion, or hydronephrosis. Bladder is decompressed around a Foley catheter. Stomach/Bowel: Stomach is within normal limits. Appendix is normal. No evidence of bowel wall thickening, distention, or inflammatory changes. Vascular/Lymphatic: The abdominal aorta is normal in caliber. Mild atherosclerotic calcification. No adenopathy in the abdomen or pelvis. Reproductive: Unremarkable Other: No acute inflammation.  No ascites. Musculoskeletal: No significant skeletal lesion. IMPRESSION: No significant abnormality Electronically Signed   By: Ellery Plunk M.D.   On: 11/15/2016 06:25   Ct Angio Chest Pe W Or Wo Contrast  Result Date: 11/15/2016 CLINICAL DATA:  New shortness of breath with history of pulmonary embolus. EXAM: CT ANGIOGRAPHY CHEST WITH CONTRAST TECHNIQUE: Multidetector CT imaging of the chest was performed using the standard protocol during bolus administration of intravenous contrast. Multiplanar CT image reconstructions and MIPs were obtained to evaluate the vascular anatomy. CONTRAST:  75 mL of Isovue 370 intravenous contrast. COMPARISON:  Chest CT, 02/16/2016.  Current abdomen pelvis CT. FINDINGS: Cardiovascular: Satisfactory opacification of the pulmonary arteries to the segmental level. No evidence of pulmonary embolism. Normal heart size. No pericardial effusion. No coronary artery calcifications. Great vessels are normal in caliber. No aortic  dissection. No significant atherosclerotic plaque. Mediastinum/Nodes: No enlarged mediastinal, hilar, or axillary lymph nodes. Thyroid gland, trachea, and esophagus demonstrate no significant findings. Lungs/Pleura: Mild atelectasis in the left lower lobe with minimal linear atelectasis in the right lung base in the right lower lobe. Mild paraseptal  emphysema in the medial right upper lobe at the apex. Lungs otherwise clear. No pleural effusion. No pneumothorax. Upper Abdomen: No acute abnormality. Musculoskeletal: No acute abnormality. 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 No osteoblastic or osteolytic lesions. Review of the MIP images confirms the above findings. IMPRESSION: 1. No evidence of a pulmonary embolus. 2. No acute findings.  No evidence of pneumonia or pulmonary edema. Electronically Signed   By: Amie Portland M.D.   On: 11/15/2016 08:51   Mr Brain Wo Contrast  Result Date: 11/16/2016 CLINICAL DATA:  Multiple sclerosis EXAM: MRI HEAD WITHOUT CONTRAST TECHNIQUE: Multiplanar, multiecho pulse sequences of the brain and surrounding structures were obtained without intravenous contrast. COMPARISON:  MRI head 10/14/2016 FINDINGS: Brain: The majority of the study was performed albeit with motion. The patient was not able to complete all the sequences. No contrast was administered. Ventricle size normal. Cerebral volume normal. Negative for acute infarct. No areas of restricted diffusion. Small periventricular and deep white matter hyperintensities bilaterally unchanged from the prior study. Brainstem and cerebellum normal. No mass lesion. Pituitary normal in size. Vascular: Normal arterial flow voids. Skull and upper cervical spine: Negative Sinuses/Orbits: Negative Other: None IMPRESSION: Image quality degraded by motion. The patient was not able to complete the study. No change from 1 month ago. Small periventricular and deep white matter hyperintensities are nonspecific and could be seen with chronic microvascular ischemia, migraine headache, demyelinating disease among other etiologies. Electronically Signed   By: Marlan Palau M.D.   On: 11/16/2016 17:15   US Renal  Result Date: 11/15/2016 CLINICAL DATA:  Acute renal injury EXAM: RENAL / URINARY TRACT ULTRASOUND COMPLETE COMPARISON:  CT scan from yesterday  FINDINGS: Right Kidney: Length: 13.5 cm.  1.4 cm cyst in the right kidney. Left Kidney: Length: 13.8 cm. Echogenicity within normal limits. No mass or hydronephrosis visualized. Bladder: The bladder is decompressed with a Foley catheter. IMPRESSION: No acute abnormalities identified.  Right renal cyst. Electronically Signed   By: Gerome Sam III M.D   On: 11/15/2016 12:59   Dg Abd Acute W/chest  Result Date: 11/15/2016 CLINICAL DATA:  Initial evaluation for acute generalized abdominal pain. EXAM: DG ABDOMEN ACUTE W/ 1V CHEST COMPARISON:  Prior MRI from 10/14/2016 as well as previous radiograph from 02/15/2016. FINDINGS: Accessed right Port-A-Cath in place with tip overlying the cavoatrial junction. Cardiac and mediastinal silhouettes within normal limits. Mild elevation left hemidiaphragm. No focal infiltrates. No pulmonary edema or pleural effusion. No pneumothorax. Bowel gas pattern within normal limits without evidence for obstruction or ileus. Mild gaseous distension of the stomach. No abnormal bowel wall thickening. Moderate amount of retained stool within the colon. No soft tissue mass. Patient is status post cholecystectomy. Scattered vascular calcifications noted. Degenerative changes noted within the spine. IMPRESSION: 1. Mild gaseous distension of the stomach. 2. Otherwise nonobstructive bowel gas pattern with no radiographic evidence for acute intra-abdominal process. 3. Moderate amount of retained stool throughout the colon. 4. Mild elevation left hemidiaphragm. No other active cardiopulmonary disease identified. Electronically Signed   By: Rise Mu M.D.   On: 11/15/2016 00:25    Time Spent in minutes  25   Eddie North M.D on 11/17/2016 at 3:22  PM  Between 7am to 7pm - Pager - [phone]  After 7pm go to [email or web address] - password [facility]  [facility] -  Office  [phone]

## 2016-11-17 NOTE — Progress Notes (Signed)
No further vomiting noted for the remainder of the shift. However, patient had periods of confusion and anxiety. Was easily redirected.

## 2016-11-18 ENCOUNTER — Inpatient Hospital Stay: Payer: Medicare Other

## 2016-11-18 LAB — PROTIME-INR
INR: 1.46
PROTHROMBIN TIME: 17.9 s — AB (ref 11.4–15.2)

## 2016-11-18 MED ORDER — LIP MEDEX EX OINT
TOPICAL_OINTMENT | CUTANEOUS | Status: AC
  Administered 2016-11-18: 1 via CUTANEOUS
  Filled 2016-11-18: qty 7

## 2016-11-18 MED ORDER — WARFARIN SODIUM 5 MG PO TABS
5.0000 mg | ORAL_TABLET | Freq: Once | ORAL | Status: AC
  Administered 2016-11-18: 19:00:00 5 mg via ORAL
  Filled 2016-11-18: qty 1

## 2016-11-18 MED ORDER — ENOXAPARIN SODIUM 100 MG/ML ~~LOC~~ SOLN
90.0000 mg | Freq: Two times a day (BID) | SUBCUTANEOUS | Status: DC
  Administered 2016-11-18 – 2016-11-21 (×6): 90 mg via SUBCUTANEOUS
  Filled 2016-11-18 (×6): qty 1

## 2016-11-18 NOTE — Progress Notes (Signed)
Physical Therapy Treatment Patient Details Name: [name] MRN: [id number] DOB: Dec 31, 1960 Today's Date: 12/10/2016    History of Present Illness 56 yo male admitted with AKI, abd pain, HA. Hx of multiple sclerosis, cluster HAs, DVT, BPPV, chronic pain.    PT Comments    Pt ambulated short distance in hallway with RW.  Pt with very slow speed however no physical assistance required during session.  Follow Up Recommendations  Home health PT;Supervision - Intermittent     Equipment Recommendations  None recommended by PT    Recommendations for Other Services       Precautions / Restrictions Precautions Precautions: Fall    Mobility  Bed Mobility Overal bed mobility: Needs Assistance Bed Mobility: Supine to Sit;Sit to Supine     Supine to sit: Min guard;HOB elevated Sit to supine: Min guard   General bed mobility comments: pt self assisted LEs over EOB with his UEs however did not use UEs to assist LEs back onto bed  Transfers Overall transfer level: Needs assistance Equipment used: Rolling walker (2 wheeled) Transfers: Sit to/from Stand Sit to Stand: Min guard         General transfer comment: min/guard for safety however pt did not require physical assistance  Ambulation/Gait Ambulation/Gait assistance: Min guard Ambulation Distance (Feet): 60 Feet Assistive device: Rolling walker (2 wheeled) Gait Pattern/deviations: Step-through pattern;Decreased stride length;Wide base of support;Shuffle     General Gait Details: very slow gait speed, pt not lifting feet up from floor - shuffling advancement, states he has walker at home to use   Stairs            Wheelchair Mobility    Modified Rankin (Stroke Patients Only)       Balance                                    Cognition Arousal/Alertness: Awake/alert Behavior During Therapy: WFL for tasks assessed/performed Overall Cognitive Status: Within Functional Limits for tasks  assessed                      Exercises      General Comments        Pertinent Vitals/Pain Pain Assessment:  (not reporting pain, just overall not feeling well)    Home Living                      Prior Function            PT Goals (current goals can now be found in the care plan section) Progress towards PT goals: Progressing toward goals    Frequency    Min 3X/week      PT Plan Current plan remains appropriate    Co-evaluation             End of Session Equipment Utilized During Treatment: Gait belt Activity Tolerance: Patient limited by fatigue Patient left: with call bell/phone within reach;in bed;with bed alarm set     Time: 1157-1209 PT Time Calculation (min) (ACUTE ONLY): 12 min  Charges:  $Gait Training: 8-22 mins                    G Codes:      Iran Rowe,KATHrine E 2016-12-10, 1:08 PM [name], PT, DPT 12/10/2016 Pager: [phone]

## 2016-11-18 NOTE — Care Management Note (Signed)
Case Management Note  Patient Details  Name: [name] MRN: [id number] Date of Birth: 1961-08-02  Subjective/Objective:                  AKI Action/Plan: Discharge planning Expected Discharge Date:                 Expected Discharge Plan:  Home/Self Care  In-House Referral:     Discharge planning Services  CM Consult  Post Acute Care Choice:    Choice offered to:  Patient  DME Arranged:  N/A DME Agency:  NA  HH Arranged:  Patient Refused [place] Agency:  NA  Status of Service:  Completed, signed off  If discussed at [place] of Stay Meetings, dates discussed:    Additional Comments: Cm met with pt in room to discuss home needs.  Pt states he has both rolling walker and 3n1 at home and is "pretty much where (he) usually is" and declines need for HHPT.  No other Cm needs were communicated. Dellie Catholic, RN 11/18/2016, 3:11 PM

## 2016-11-18 NOTE — Progress Notes (Signed)
PT Cancellation Note  Patient Details Name: [name] MRN: [id number] DOB: [birth year]   Cancelled Treatment:    Reason Eval/Treat Not Completed: Other (comment) Pt states he doesn't feel well and declines to mobilize at this time.  Will check back.   Lenox Ladouceur,KATHrine E 11/18/2016, 10:10 AM [name], PT, DPT 11/18/2016 Pager: [phone]

## 2016-11-18 NOTE — Progress Notes (Signed)
ANTICOAGULATION CONSULT NOTE  Pharmacy Consult for Warfarin Indication: Hx DVT  No Known Allergies  Patient Measurements: Height: 5\' 9"  (175.3 cm) Weight: 196 lb (88.9 kg) IBW/kg (Calculated) : 70.7  Vital Signs: Temp: 98.5 F (36.9 C) (01/09 0520) Temp Source: Oral (01/09 0520) BP: 94/53 (01/09 0520) Pulse Rate: 87 (01/09 0520)  Labs:  Recent Labs  11/16/16 0430 11/17/16 0415 11/18/16 0500  HGB 12.4*  --   --   HCT 38.1*  --   --   PLT 306  --   --   LABPROT 24.4* 15.9* 17.9*  INR 2.15 1.27 1.46  CREATININE 0.78  --   --    Estimated Creatinine Clearance: 113.8 mL/min (by C-G formula based on SCr of 0.78 mg/dL).  Medical History: Past Medical History:  Diagnosis Date  . Abdominal pain, unspecified site   . Anxiety   . Arthritis   . Benign paroxysmal positional vertigo   . Chronic back pain   . Chronic pain syndrome 01/25/2008  . Cluster headache   . Depression    takes Zoloft daily  . DVT (deep venous thrombosis) (HCC)    in the left arm '09  . Gait abnormality    "uses mobile wheelchair, but is ambulatory"  . Gallstones 02/17/2009   resolved after gallbladder surgery.  [name] GERD (gastroesophageal reflux disease)    takes Omeprazole as needed  . Headache(784.0)    cluster headaches frequently-takes Topamax daily  . History of colonoscopy   . HTN (hypertension)    takes Lisinopril,Verapamil,and Triamterene HCTZ daily  . Insomnia 11/06/2008  . Joint pain   . Joint swelling    03-07-16 "swelling of right wrist" "after a fall-xray done 03-06-16 "no fractures".  . Memory loss    no an issue at present 03-07-16  . Multiple sclerosis (HCC)    Dx. 2005 - Dr. Tinnie Gens follows LOV 4'17 tx. [facility]abri [facility] [place],.  [name] Nonspecific elevation of levels of transaminase or lactic acid dehydrogenase (LDH)   . Other specified visual disturbances   . Other syndromes affecting cervical region   . Pneumonia 2009  . Trigeminal neuralgia    history" Multiple sclerosis"   Medicatio Scheduled:  . dalfampridine  10 mg Oral BID  . gabapentin  600 mg Oral QID  . levETIRAcetam  500 mg Oral BID  . pantoprazole  80 mg Oral Daily  . polyethylene glycol  17 g Oral Daily  . senna  2 tablet Oral Daily  . sertraline  50 mg Oral Daily  . sodium chloride flush  10-40 mL Intracatheter Q12H  . topiramate  50 mg Oral BID  . verapamil  40 mg Oral BID  . 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  INR on admission was 7.53 with vitamin K 2.5 mg PO x1 given on 11/16/15.  Patient reported no new medications or significant diet changes PTA.  Home warfarin regimen:  5 mg daily  Today, 11/18/2016:  INR  subtherapeutic at 1.46 but trending up  No bleeding documented  No significant drug-drug intxns with current meds (abx d/ced on 1/7)  Heart healthy diet  Goal of Therapy: INR 2-3  Plan:  Repeat Warfarin 5mg  today at 1800  Monitor for signs of bleeding  Dorna Leitz, PharmD, BCPS 11/18/2016 9:13 AM

## 2016-11-18 NOTE — Progress Notes (Signed)
[facility]  PROGRESS NOTE  [name] ZOX:[id number] DOB: 12-Mar-1961 DOA: 11/14/2016 PCP: Dorrene German, MD   Brief HPI:   56 y.o. male with medical history of multiple sclerosis, hypertension, chronic hepatitis C, chronic pain syndrome, DVT left arm, depression presents with 2 day history of abdominal pain, "cluster headache", and dyspnea. The patient normally is able to make transfers but essentially is nonambulatory at baseline. The patient had been complaining of dysuria and difficulty urinating for a period of 2 days with associated abdominal pain that was generalized. He denied any fevers, chills, nausea, vomiting, diarrhea. He normally is able to urinate spontaneously without difficulty. In the emergency department, the patient was found to have urinary retention and a Foley catheter was inserted which drained > 800 cc  of urine.    The patient was started on IV fluids and ceftriaxone. EDP consult to neurology who recommended admission for IV antibiotics and hydration. If the patient's symptoms are not improved, then MRI of the brain and cervical spine with contrast would be recommended.    Subjective   This morning patient Denies any complaints. MRI cervical spine done yesterday could not show any acute demyelination because of motion artifact.   Assessment/Plan:     1. Urinary retention- Resolved, put a Foley catheter was removed and patient urinated on his on. P is is a fracture of the urinated was treated cellulitis in repeat infection and metabolic elevating at the same setting for dental assistant to undergo this is infection patient/family/atient presented with urinary retention, Foley catheter placed in ED drained more than 800 mL urine. Continue Foley catheter. ED physician consulted neurology and neurology recommended to check MRI of brain and cervical spine with contrast to rule out acute MS flare causing urinary retention. 2. ? UTI- patient was empirically started  on ceftriaxone, urine culture showed no growth. We'll discontinue ceftriaxone at this time. 3. Multiple sclerosis- patient had MRI done a month ago which showed no acute changes, MRI cervical spine and brain with contrast were poor quality to rule out acute flare of MS. patient back to baseline. PT recommended home health PT but patient refuses home health PT at this time 4. Chronic abdominal pain-patient has history of chronic abdominal pain.He has had an extensive evaluation including colonoscopy, MRCP, and multiple CTs of his abdomen and pelvis which all have been unremarkable. Patient also complained of constipation, minimize opiates, CT abdomen pelvis was negative for acute findings. Continue Senokot tablets, MiraLAX. 5. Cluster headache/chronic pain syndrome- he follows Dr. Vear Clock for pain management, continue home dose Percocet. Continue Keppra, Topamax. 6. History of DVT left upper extremity- continue Coumadin, last episode was in 2011. INR is subtherapeutic as patient received vitamin K on 11/15/2016. We'll start Lovenox for bridging until INR therapeutic 7. Chronic hepatitis C- patient says that he stopped taking Epclusa at home, after discussion with neurologist/PCP. As it was causing side effects of headache. Will discontinue Epclusa.    DVT prophylaxis: Warfarin  Code Status: Full code  Family Communication: *No family present at bedside  Disposition Plan: Pending therapeutic INR   Consultants:  None  Procedures:  None   Antibiotics:   Anti-infectives    Start     Dose/Rate Route Frequency Ordered Stop   11/16/16 0900  cefTRIAXone (ROCEPHIN) 1 g in dextrose 5 % 50 mL IVPB  Status:  Discontinued     1 g 100 mL/hr over 30 Minutes Intravenous Every 24 hours 11/15/16 1222 11/16/16 1625   11/15/16 1700  Sofosbuvir-Velpatasvir 400-100 MG TABS 1 tablet  Status:  Discontinued     1 tablet Oral Daily 11/15/16 1222 11/16/16 0950   11/15/16 0915  cefTRIAXone (ROCEPHIN) 1 g in  dextrose 5 % 50 mL IVPB     1 g 100 mL/hr over 30 Minutes Intravenous  Once 11/15/16 0902 11/15/16 1014       Objective   Vitals:   11/17/16 2245 11/18/16 0520 11/18/16 0921 11/18/16 1438  BP: (!) 107/58 (!) 94/53 101/61 (!) 108/58  Pulse: 94 87 72 79  Resp: 16 16  20   Temp: 98.8 F (37.1 C) 98.5 F (36.9 C)  98.2 F (36.8 C)  TempSrc: Oral Oral  Oral  SpO2: 94% 95%  97%  Weight:      Height:        Intake/Output Summary (Last 24 hours) at 11/18/16 1636 Last data filed at 11/18/16 1556  Gross per 24 hour  Intake             1100 ml  Output             1500 ml  Net             -400 ml   Filed Weights   11/14/16 2020 11/14/16 2021  Weight: 89.8 kg (198 lb) 88.9 kg (196 lb)     Physical Examination:  General exam: Appears calm and comfortable. Respiratory system: Clear to auscultation. Respiratory effort normal. Cardiovascular system:  RRR. No  murmurs, rubs, gallops. No pedal edema. GI system: Abdomen is nondistended, soft and nontender. No organomegaly.  Central nervous system. No focal neurological deficits. 5 x 5 power in all extremities. Skin: No rashes, lesions or ulcers. Psychiatry: Alert, oriented x 3.Judgement and insight appear normal. Affect normal.    Data Reviewed: I have personally reviewed following labs and imaging studies  CBG: No results for input(s): GLUCAP in the last 168 hours.  CBC:  Recent Labs Lab 11/14/16 2350 11/16/16 0430  WBC 12.2* 11.1*  HGB 15.5 12.4*  HCT 47.2 38.1*  MCV 80.4 80.4  PLT 362 306    Basic Metabolic Panel:  Recent Labs Lab 11/14/16 2350 11/16/16 0430  NA 145 140  K 3.8 3.6  CL 112* 108  CO2 23 26  GLUCOSE 151* 98  BUN 41* 20  CREATININE 1.67* 0.78  CALCIUM 9.3 8.7*    Recent Results (from the past 240 hour(s))  Urine culture     Status: None   Collection Time: 11/14/16 11:50 PM  Result Value Ref Range Status   Specimen Description URINE, CATHETERIZED  Final   Special Requests NONE  Final    Culture NO GROWTH Performed at [facility]   Final   Report Status 11/16/2016 FINAL  Final     Liver Function Tests:  Recent Labs Lab 11/14/16 2350  AST 27  ALT 29  ALKPHOS 100  BILITOT 0.4  PROT 9.0*  ALBUMIN 4.9    Recent Labs Lab 11/14/16 2350  LIPASE 18      Studies: Mr Brain Wo Contrast  Result Date: 11/16/2016 CLINICAL DATA:  Multiple sclerosis EXAM: MRI HEAD WITHOUT CONTRAST TECHNIQUE: Multiplanar, multiecho pulse sequences of the brain and surrounding structures were obtained without intravenous contrast. COMPARISON:  MRI head 10/14/2016 FINDINGS: Brain: The majority of the study was performed albeit with motion. The patient was not able to complete all the sequences. No contrast was administered. Ventricle size normal. Cerebral volume normal. Negative for acute infarct. No areas of restricted  diffusion. Small periventricular and deep white matter hyperintensities bilaterally unchanged from the prior study. Brainstem and cerebellum normal. No mass lesion. Pituitary normal in size. Vascular: Normal arterial flow voids. Skull and upper cervical spine: Negative Sinuses/Orbits: Negative Other: None IMPRESSION: Image quality degraded by motion. The patient was not able to complete the study. No change from 1 month ago. Small periventricular and deep white matter hyperintensities are nonspecific and could be seen with chronic microvascular ischemia, migraine headache, demyelinating disease among other etiologies. Electronically Signed   By: Marlan Palau M.D.   On: 11/16/2016 17:15   Mr Cervical Spine Wo Contrast  Result Date: 11/17/2016 CLINICAL DATA:  56 year old male with history multiple sclerosis in bilateral lower extremity weakness and urinary incontinence. Questioned MS flare. Subsequent encounter. EXAM: MRI CERVICAL SPINE WITHOUT CONTRAST TECHNIQUE: Multiplanar, multisequence MR imaging of the cervical spine was performed. No intravenous contrast was  administered. COMPARISON:  11/16/2016 and brain MR. 10/04/2014 cervical spine MR. FINDINGS: Exam is significantly motion degraded despite 2 attempts and Ativan. Alignment: Within normal limits. Vertebrae: No obvious osseous abnormality. Cord: Previous exam was also motion degraded although less so than on the present examination. On the prior exam areas of possible demyelination of the cervical cord noted on the left from the C2 through upper C5 level and on the right at the C6, C6-7 level and T1 level. Secondary to motion degradation it is not possible to determine if there has been an interval change. Disc levels: Cervical spondylotic changes C3-4 thru C5-6 once again noted but not adequately assessed secondary to motion artifact. IMPRESSION: Secondary to significant motion degradation evaluating for new demyelinating process or evaluating for progression of degenerative changes is not possible. Please see above Electronically Signed   By: Lacy Duverney M.D.   On: 11/17/2016 18:38    Scheduled Meds: . dalfampridine  10 mg Oral BID  . gabapentin  600 mg Oral QID  . levETIRAcetam  500 mg Oral BID  . pantoprazole  80 mg Oral Daily  . polyethylene glycol  17 g Oral Daily  . senna  2 tablet Oral Daily  . sertraline  50 mg Oral Daily  . sodium chloride flush  10-40 mL Intracatheter Q12H  . topiramate  50 mg Oral BID  . verapamil  40 mg Oral BID  . warfarin  5 mg Oral ONCE-1800  . Warfarin - Pharmacist Dosing Inpatient   Does not apply q1800      Time spent: 25 min  [facility]   [facility] Pager [phone]. If 7PM-7AM, please contact night-coverage at [email or web address], Office  [phone]  password [email or web address] 11/18/2016, 4:36 PM  LOS: 3 days

## 2016-11-18 NOTE — Progress Notes (Addendum)
PHARMACY - LOVENOX  Patient known to pharmacy from warfarin dosing for + DVT.  Patient on warfarin PTA and INR upon admission was 7.53.  Patient subsequently received Vitamin K 2.5mg  po x 1 on 11/15/16.  Pharmacy has been dosing warfarin and INR trending up; however INR still subtherapeutic @ 1.46.  MD has asked pharmacy to dose Lovenox for DVT treatment for bridging until inr > 2.  Weight: 88.9 kg CrCl : 113 ml/min  Plan: Lovenox 90 mg sq q12h Continue Coumadin as ordered  Terrilee Files, PharmD

## 2016-11-19 DIAGNOSIS — G35 Multiple sclerosis: Secondary | ICD-10-CM

## 2016-11-19 DIAGNOSIS — R109 Unspecified abdominal pain: Secondary | ICD-10-CM

## 2016-11-19 DIAGNOSIS — I82499 Acute embolism and thrombosis of other specified deep vein of unspecified lower extremity: Secondary | ICD-10-CM

## 2016-11-19 DIAGNOSIS — Z86718 Personal history of other venous thrombosis and embolism: Secondary | ICD-10-CM

## 2016-11-19 DIAGNOSIS — I10 Essential (primary) hypertension: Secondary | ICD-10-CM

## 2016-11-19 DIAGNOSIS — G8929 Other chronic pain: Secondary | ICD-10-CM

## 2016-11-19 DIAGNOSIS — N179 Acute kidney failure, unspecified: Principal | ICD-10-CM

## 2016-11-19 DIAGNOSIS — B182 Chronic viral hepatitis C: Secondary | ICD-10-CM

## 2016-11-19 DIAGNOSIS — R338 Other retention of urine: Secondary | ICD-10-CM

## 2016-11-19 DIAGNOSIS — K219 Gastro-esophageal reflux disease without esophagitis: Secondary | ICD-10-CM

## 2016-11-19 LAB — PROTIME-INR
INR: 1.56
PROTHROMBIN TIME: 18.8 s — AB (ref 11.4–15.2)

## 2016-11-19 MED ORDER — POLYETHYLENE GLYCOL 3350 17 G PO PACK: 17.0000 g | PACK | Freq: Every day | ORAL | Status: DC

## 2016-11-19 MED ORDER — WARFARIN SODIUM 5 MG PO TABS
7.5000 mg | ORAL_TABLET | Freq: Once | ORAL | Status: AC
  Administered 2016-11-19: 7.5 mg via ORAL
  Filled 2016-11-19: qty 1

## 2016-11-19 MED ORDER — SODIUM CHLORIDE 0.9 % IV SOLN
INTRAVENOUS | Status: DC
  Administered 2016-11-19: 125 mL/h via INTRAVENOUS
  Administered 2016-11-19 – 2016-11-20 (×2): via INTRAVENOUS

## 2016-11-19 MED ORDER — POLYETHYLENE GLYCOL 3350 17 G PO PACK
17.0000 g | PACK | Freq: Two times a day (BID) | ORAL | Status: DC
  Filled 2016-11-19: qty 1

## 2016-11-19 MED ORDER — ENOXAPARIN (LOVENOX) PATIENT EDUCATION KIT
PACK | Freq: Once | Status: AC
  Administered 2016-11-19: 14:00:00
  Filled 2016-11-19: qty 1

## 2016-11-19 MED ORDER — SENNOSIDES-DOCUSATE SODIUM 8.6-50 MG PO TABS
1.0000 | ORAL_TABLET | Freq: Two times a day (BID) | ORAL | Status: DC
  Administered 2016-11-19 – 2016-11-21 (×5): 1 via ORAL
  Filled 2016-11-19 (×5): qty 1

## 2016-11-19 NOTE — Progress Notes (Signed)
PROGRESS NOTE    Tacori Kulaga  BBC:[id number] DOB: 1960-12-22 DOA: 11/14/2016 PCP: Dorrene German, MD    Brief Narrative:  56 y.o.malewith medical history ofmultiple sclerosis, hypertension, chronic hepatitis C, chronic pain syndrome, DVT left arm, depression presents with 2 day history of abdominal pain, "cluster headache", and dyspnea. The patient normally is able to make transfers but essentially is nonambulatory at baseline. The patient had been complaining of dysuria and difficulty urinating for a period of 2 days with associated abdominal pain that was generalized. He denied any fevers, chills, nausea, vomiting, diarrhea. He normally is able to urinate spontaneously without difficulty. In the emergency department, the patient was found to have urinary retention and a Foley catheter was inserted which drained >800 cc of urine.    The patient was started on IV fluids and ceftriaxone. EDP consult to neurology who recommended admission for IV antibiotics and hydration. If the patient's symptoms are not improved, then MRI of the brain and cervical spine with contrast would be recommended.    Assessment & Plan:   Principal Problem:   Chronic cluster headache, not intractable Active Problems:   PULMONARY EMBOLISM, HX OF   Multiple sclerosis (HCC)   DVT (deep venous thrombosis) (HCC)   Depression   GERD (gastroesophageal reflux disease)   Essential hypertension   Gastroesophageal reflux disease without esophagitis   AKI (acute kidney injury) (HCC)   Chronic hepatitis C virus infection (HCC)   Chronic abdominal pain   Chronic pain syndrome   Acute retention of urine  #1 chronic cluster headaches Patient with complaints of his chronic cluster headaches states has been headache free for almost 2 years until this episode. Patient states no significant improvement. MRI brain with no significant changes. MRI C-spine due to motion unable to assess for any changes. Spoke with  neurology on-call for further evaluation and management. Neurology recommended placing patient on high flow O2 every 6 hours approximate 30 minutes her session and if no improvement in the next 24 hours may consider starting patient on Tegretol 200 mg 3 times daily. Follow.  #2 history of PE/DVT INR currently subtherapeutic at 1.56 post vitamin K. Continue full dose Lovenox. Coumadin per pharmacy.  #3 supratherapeutic INR On our 7.52 on admission. Status post vitamin K 2.5 mg by mouth 1. INR currently at 1.56. Coumadin per pharmacy.  #4 urinary retention Status post Foley catheter placement just subsequently been removed. Patient with good urine output.  #5 acute kidney injury Resolved with hydration.  #6 chronic abdominal pain/chronic pain syndrome Currently close to baseline. Patient status post extensive evaluation including colonoscopy, MRCP, multiple CTs of his abdomen and pelvis all which have been unremarkable. Present for possible constipation. Patient states has had multiple bowel movements during this hospitalization and does not feel constipated. Minimize opiates. Continue MiraLAX daily and Senokot S tablets. Follow.  #7 chronic hepatitis C Patient stated had stopped taking at [facility] at home after discussion with neurologist/PCP it was felt this may have been causing side effects of headache.   DVT prophylaxis: Full dose Lovenox/Coumadin Code Status: Full Family Communication: Updated patient. No family at bedside. Disposition Plan: Home once cluster headaches have improved.   Consultants:   None  Procedures:   MRI C-spine 11/17/2016  MRI brain 11/16/2016  Acute abdominal series 11/15/2016  Renal ultrasound 11/15/2016  Antimicrobials:   IV Rocephin 11/15/2016>>>> 11/16/2016     Subjective: Patient complaining of cluster headaches which he states have been ongoing since admission and rates a 9  out of 10. Patient denies any chest and. No shortness of breath.  Improvement with abdominal pain and close to his baseline chronic abdominal pain. Patient states that been having movements. Patient states good urinary output.  Objective: Vitals:   11/18/16 2142 11/19/16 0513 11/19/16 1352 11/19/16 1421  BP: 109/69 114/76 105/69 110/66  Pulse: 86 63 63   Resp: 18 14 14    Temp: 98.9 F (37.2 C) 97.9 F (36.6 C) 98.3 F (36.8 C)   TempSrc: Oral Oral Oral   SpO2: 96% 95% 100%   Weight:      Height:        Intake/Output Summary (Last 24 hours) at 11/19/16 1551 Last data filed at 11/19/16 1354  Gross per 24 hour  Intake             1210 ml  Output             1465 ml  Net             -255 ml   Filed Weights   11/14/16 2020 11/14/16 2021  Weight: 89.8 kg (198 lb) 88.9 kg (196 lb)    Examination:  General exam: Appears calm and comfortable  Respiratory system: Clear to auscultation. Respiratory effort normal. Cardiovascular system: S1 & S2 heard, RRR. No JVD, murmurs, rubs, gallops or clicks. No pedal edema. Gastrointestinal system: Abdomen is nondistended, soft and nontender. No organomegaly or masses felt. Normal bowel sounds heard. Central nervous system: Alert and oriented. No focal neurological deficits. Extremities: Symmetric 5 x 5 power. Skin: No rashes, lesions or ulcers Psychiatry: Judgement and insight appear normal. Mood & affect appropriate.     Data Reviewed: I have personally reviewed following labs and imaging studies  CBC:  Recent Labs Lab 11/14/16 2350 11/16/16 0430  WBC 12.2* 11.1*  HGB 15.5 12.4*  HCT 47.2 38.1*  MCV 80.4 80.4  PLT 362 306   Basic Metabolic Panel:  Recent Labs Lab 11/14/16 2350 11/16/16 0430  NA 145 140  K 3.8 3.6  CL 112* 108  CO2 23 26  GLUCOSE 151* 98  BUN 41* 20  CREATININE 1.67* 0.78  CALCIUM 9.3 8.7*   GFR: Estimated Creatinine Clearance: 113.8 mL/min (by C-G formula based on SCr of 0.78 mg/dL). Liver Function Tests:  Recent Labs Lab 11/14/16 2350  AST 27  ALT 29    ALKPHOS 100  BILITOT 0.4  PROT 9.0*  ALBUMIN 4.9    Recent Labs Lab 11/14/16 2350  LIPASE 18   No results for input(s): AMMONIA in the last 168 hours. Coagulation Profile:  Recent Labs Lab 11/15/16 0921 11/16/16 0430 11/17/16 0415 11/18/16 0500 11/19/16 0503  INR 7.53* 2.15 1.27 1.46 1.56   Cardiac Enzymes: No results for input(s): CKTOTAL, CKMB, CKMBINDEX, TROPONINI in the last 168 hours. BNP (last 3 results) No results for input(s): PROBNP in the last 8760 hours. HbA1C: No results for input(s): HGBA1C in the last 72 hours. CBG: No results for input(s): GLUCAP in the last 168 hours. Lipid Profile: No results for input(s): CHOL, HDL, LDLCALC, TRIG, CHOLHDL, LDLDIRECT in the last 72 hours. Thyroid Function Tests: No results for input(s): TSH, T4TOTAL, FREET4, T3FREE, THYROIDAB in the last 72 hours. Anemia Panel: No results for input(s): VITAMINB12, FOLATE, FERRITIN, TIBC, IRON, RETICCTPCT in the last 72 hours. Sepsis Labs: No results for input(s): PROCALCITON, LATICACIDVEN in the last 168 hours.  Recent Results (from the past 240 hour(s))  Urine culture     Status: None  Collection Time: 11/14/16 11:50 PM  Result Value Ref Range Status   Specimen Description URINE, CATHETERIZED  Final   Special Requests NONE  Final   Culture NO GROWTH Performed at [facility]   Final   Report Status 11/16/2016 FINAL  Final         Radiology Studies: Mr Cervical Spine Wo Contrast  Result Date: 11/17/2016 CLINICAL DATA:  56 year old male with history multiple sclerosis in bilateral lower extremity weakness and urinary incontinence. Questioned MS flare. Subsequent encounter. EXAM: MRI CERVICAL SPINE WITHOUT CONTRAST TECHNIQUE: Multiplanar, multisequence MR imaging of the cervical spine was performed. No intravenous contrast was administered. COMPARISON:  11/16/2016 and brain MR. 10/04/2014 cervical spine MR. FINDINGS: Exam is significantly motion degraded despite 2  attempts and Ativan. Alignment: Within normal limits. Vertebrae: No obvious osseous abnormality. Cord: Previous exam was also motion degraded although less so than on the present examination. On the prior exam areas of possible demyelination of the cervical cord noted on the left from the C2 through upper C5 level and on the right at the C6, C6-7 level and T1 level. Secondary to motion degradation it is not possible to determine if there has been an interval change. Disc levels: Cervical spondylotic changes C3-4 thru C5-6 once again noted but not adequately assessed secondary to motion artifact. IMPRESSION: Secondary to significant motion degradation evaluating for new demyelinating process or evaluating for progression of degenerative changes is not possible. Please see above Electronically Signed   By: Lacy Duverney M.D.   On: 11/17/2016 18:38        Scheduled Meds: . dalfampridine  10 mg Oral BID  . enoxaparin (LOVENOX) injection  90 mg Subcutaneous Q12H  . gabapentin  600 mg Oral QID  . levETIRAcetam  500 mg Oral BID  . pantoprazole  80 mg Oral Daily  . [START ON 11/20/2016] polyethylene glycol  17 g Oral Daily  . senna-docusate  1 tablet Oral BID  . sertraline  50 mg Oral Daily  . sodium chloride flush  10-40 mL Intracatheter Q12H  . topiramate  50 mg Oral BID  . verapamil  40 mg Oral BID  . warfarin  7.5 mg Oral ONCE-1800  . Warfarin - Pharmacist Dosing Inpatient   Does not apply q1800   Continuous Infusions: . sodium chloride 125 mL/hr (11/19/16 1205)     LOS: 4 days    Time spent: 40 minutes    THOMPSON,DANIEL, MD [facility] Pager [phone]  If 7PM-7AM, please contact night-coverage [email or web address] Password [email or web address] 11/19/2016, 3:51 PM

## 2016-11-19 NOTE — Progress Notes (Signed)
ANTICOAGULATION CONSULT NOTE  Pharmacy Consult for Warfarin Indication: Hx DVT  No Known Allergies  Patient Measurements: Height: 5\' 9"  (175.3 cm) Weight: 196 lb (88.9 kg) IBW/kg (Calculated) : 70.7  Vital Signs: Temp: 98.3 F (36.8 C) (01/10 1352) Temp Source: Oral (01/10 1352) BP: 105/69 (01/10 1352) Pulse Rate: 63 (01/10 1352)  Labs:  Recent Labs  11/17/16 0415 11/18/16 0500 11/19/16 0503  LABPROT 15.9* 17.9* 18.8*  INR 1.27 1.46 1.56   Estimated Creatinine Clearance: 113.8 mL/min (by C-G formula based on SCr of 0.78 mg/dL).  Medicatio Scheduled:  . dalfampridine  10 mg Oral BID  . enoxaparin (LOVENOX) injection  90 mg Subcutaneous Q12H  . enoxaparin   Does not apply Once  . gabapentin  600 mg Oral QID  . levETIRAcetam  500 mg Oral BID  . pantoprazole  80 mg Oral Daily  . [START ON 11/20/2016] polyethylene glycol  17 g Oral Daily  . senna-docusate  1 tablet Oral BID  . sertraline  50 mg Oral Daily  . sodium chloride flush  10-40 mL Intracatheter Q12H  . topiramate  50 mg Oral BID  . verapamil  40 mg Oral BID  . 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  INR on admission was 7.53 with vitamin K 2.5 mg PO x1 given on 11/16/15.  Patient reported no new medications or significant diet changes PTA.  Home warfarin regimen:  5 mg daily 1/9: Lovenox per pharmacy added to bridge until INR therapeutic  Today, 11/19/2016:  INR  subtherapeutic but trending up  No bleeding documented  No significant drug-drug intxns with current meds (abx d/ced on 1/7)  Heart healthy diet, eating sporadically  Goal of Therapy: INR 2-3 Anti-Xa level 0.6-1 units/ml 4hrs after LMWH dose given  Plan:  Warfarin 7.5 mg today at 1800  Continue Lovenox 90 mg SQ bid  Will order CBC q72 hr starting  tomorrow  Monitor for signs of bleeding  Bernadene Person, PharmD, BCPS Pager: [phone] 11/19/2016, 2:15 PM

## 2016-11-19 NOTE — Care Management Note (Signed)
Case Management Note  Patient Details  Name: [name] MRN: [id number] Date of Birth: [birth year]  Subjective/Objective:                  Abdominal pain, cluster headache, dyspnea Action/Plan: Discharge planning Expected Discharge Date:  11/16/16               Expected Discharge Plan:  [place]  In-House Referral:     Discharge planning Services  CM Consult  Post Acute Care Choice:    Choice offered to:  Patient  DME Arranged:  N/A DME Agency:  NA  HH Arranged:  RN, PT [place] Agency:  [place]  Status of Service:  Completed, signed off  If discussed at [place] of Stay Meetings, dates discussed:    Additional Comments: Cm met with pt in room to offer choice (as yesterday he refused) and pt chooses [facility] to render HHPT/RN (INR checks per [facility] protocol and to call to PCP for dosing).  Referral called to [facility] rep, Joelene Millin.  Pt has all DME needed at home. MD to place face to face and orders. Dellie Catholic, RN 11/19/2016, 11:18 AM

## 2016-11-19 NOTE — Care Management Important Message (Signed)
Important Message  Patient Details  Name: [name] MRN: [id number] Date of Birth: 01-19-1961   Medicare Important Message Given:  Yes    Caren Macadam 11/19/2016, 2:25 PMImportant Message  Patient Details  Name: [name] MRN: [id number] Date of Birth: [birth year]   Medicare Important Message Given:  Yes    Caren Macadam 11/19/2016, 2:25 PM

## 2016-11-19 NOTE — Progress Notes (Signed)
Per MD order, Pt placed on 10L 02 for 30 mins. RN notified.

## 2016-11-20 DIAGNOSIS — G894 Chronic pain syndrome: Secondary | ICD-10-CM

## 2016-11-20 LAB — BASIC METABOLIC PANEL
Anion gap: 4 — ABNORMAL LOW (ref 5–15)
BUN: 17 mg/dL (ref 6–20)
CO2: 23 mmol/L (ref 22–32)
Calcium: 8.5 mg/dL — ABNORMAL LOW (ref 8.9–10.3)
Chloride: 113 mmol/L — ABNORMAL HIGH (ref 101–111)
Creatinine, Ser: 0.85 mg/dL (ref 0.61–1.24)
GFR calc Af Amer: >60 mL/min (ref >60)
GLUCOSE: 98 mg/dL (ref 65–99)
Potassium: 3.7 mmol/L (ref 3.5–5.1)
Sodium: 140 mmol/L (ref 135–145)

## 2016-11-20 LAB — CBC
HEMATOCRIT: 34.8 % — AB (ref 39.0–52.0)
Hemoglobin: 11.3 g/dL — ABNORMAL LOW (ref 13.0–17.0)
MCH: 25.6 pg — AB (ref 26.0–34.0)
MCHC: 32.5 g/dL (ref 30.0–36.0)
MCV: 78.9 fL (ref 78.0–100.0)
Platelets: 300 10*3/uL (ref 150–400)
RBC: 4.41 MIL/uL (ref 4.22–5.81)
RDW: 14.9 % (ref 11.5–15.5)
WBC: 8.2 10*3/uL (ref 4.0–10.5)

## 2016-11-20 LAB — PROTIME-INR
INR: 1.9
PROTHROMBIN TIME: 22 s — AB (ref 11.4–15.2)

## 2016-11-20 MED ORDER — WARFARIN SODIUM 5 MG PO TABS
5.0000 mg | ORAL_TABLET | Freq: Once | ORAL | Status: AC
  Administered 2016-11-20: 5 mg via ORAL
  Filled 2016-11-20: qty 1

## 2016-11-20 NOTE — Progress Notes (Signed)
PROGRESS NOTE    [name]  ZOX:[id number] DOB: 07-Dec-1960 DOA: 11/14/2016 PCP: Dorrene German, MD    Brief Narrative:  56 y.o.malewith medical history ofmultiple sclerosis, hypertension, chronic hepatitis C, chronic pain syndrome, DVT left arm, depression presents with 2 day history of abdominal pain, "cluster headache", and dyspnea. The patient normally is able to make transfers but essentially is nonambulatory at baseline. The patient had been complaining of dysuria and difficulty urinating for a period of 2 days with associated abdominal pain that was generalized. He denied any fevers, chills, nausea, vomiting, diarrhea. He normally is able to urinate spontaneously without difficulty. In the emergency department, the patient was found to have urinary retention and a Foley catheter was inserted which drained >800 cc of urine.    The patient was started on IV fluids and ceftriaxone. EDP consult to neurology who recommended admission for IV antibiotics and hydration. If the patient's symptoms are not improved, then MRI of the brain and cervical spine with contrast would be recommended.    Assessment & Plan:   Principal Problem:   Chronic cluster headache, not intractable Active Problems:   PULMONARY EMBOLISM, HX OF   Multiple sclerosis (HCC)   DVT (deep venous thrombosis) (HCC)   Depression   GERD (gastroesophageal reflux disease)   Essential hypertension   Gastroesophageal reflux disease without esophagitis   AKI (acute kidney injury) (HCC)   Chronic hepatitis C virus infection (HCC)   Chronic abdominal pain   Chronic pain syndrome   Acute retention of urine  #1 chronic cluster headaches Patient states improvement with cluster headaches after being started on high flow oxygen. Patient states has been headache free for almost 2 years until this episode.  MRI brain with no significant changes. MRI C-spine due to motion unable to assess for any changes. Spoke with  neurology on-call for further evaluation and management. Neurology recommended placing patient on high flow O2 every 6 hours approximate 30 minutes her session and if no improvement in the next 24 hours may consider starting patient on Tegretol 200 mg 3 times daily. Continue high flow O2 as patient with significant clinical improvement. Will need outpatient follow-up. Follow.  #2 history of PE/DVT INR currently subtherapeutic at 1.90 post vitamin K. Continue full dose Lovenox. Coumadin per pharmacy.  #3 supratherapeutic INR INR 7.52 on admission. Status post vitamin K 2.5 mg by mouth 1. INR currently at 1.90. Coumadin per pharmacy.  #4 urinary retention Status post Foley catheter placement just subsequently been removed. Patient with good urine output.  #5 acute kidney injury Resolved with hydration.  #6 chronic abdominal pain/chronic pain syndrome Currently close to baseline. Patient status post extensive evaluation including colonoscopy, MRCP, multiple CTs of his abdomen and pelvis all which have been unremarkable. Present for possible constipation. Patient states has had multiple bowel movements during this hospitalization and does not feel constipated. Minimize opiates. Continue MiraLAX daily and Senokot S tablets. Follow.  #7 chronic hepatitis C Patient stated had stopped taking at [facility] at home after discussion with neurologist/PCP it was felt this may have been causing side effects of headache.   DVT prophylaxis: Full dose Lovenox/Coumadin Code Status: Full Family Communication: Updated patient. No family at bedside. Disposition Plan: Home once cluster headaches have improved, hopefully tomorrow.   Consultants:   None  Procedures:   MRI C-spine 11/17/2016  MRI brain 11/16/2016  Acute abdominal series 11/15/2016  Renal ultrasound 11/15/2016  Antimicrobials:   IV Rocephin 11/15/2016>>>> 11/16/2016     Subjective:  Patient states improvement with cluster  headaches on high flow oxygen. Patient denies any chest and. No shortness of breath. Improvement with abdominal pain and close to his baseline chronic abdominal pain. Patient states that been having bowel movements. Patient states good urinary output.  Objective: Vitals:   11/19/16 1421 11/19/16 2143 11/19/16 2146 11/20/16 0622  BP: 110/66  111/71 109/73  Pulse:  70 74 64  Resp:  18 16 16   Temp:   97.5 F (36.4 C) 97.6 F (36.4 C)  TempSrc:   Oral Oral  SpO2:  95% 96% 99%  Weight:      Height:        Intake/Output Summary (Last 24 hours) at 11/20/16 1120 Last data filed at 11/20/16 0924  Gross per 24 hour  Intake          3329.58 ml  Output              870 ml  Net          2459.58 ml   Filed Weights   11/14/16 2020 11/14/16 2021  Weight: 89.8 kg (198 lb) 88.9 kg (196 lb)    Examination:  General exam: Appears calm and comfortable  Respiratory system: Clear to auscultation. Respiratory effort normal. Cardiovascular system: S1 & S2 heard, RRR. No JVD, murmurs, rubs, gallops or clicks. No pedal edema. Gastrointestinal system: Abdomen is nondistended, soft and nontender. No organomegaly or masses felt. Normal bowel sounds heard. Central nervous system: Alert and oriented. No focal neurological deficits. Extremities: Symmetric 5 x 5 power. Skin: No rashes, lesions or ulcers Psychiatry: Judgement and insight appear normal. Mood & affect appropriate.     Data Reviewed: I have personally reviewed following labs and imaging studies  CBC:  Recent Labs Lab 11/14/16 2350 11/16/16 0430  WBC 12.2* 11.1*  HGB 15.5 12.4*  HCT 47.2 38.1*  MCV 80.4 80.4  PLT 362 306   Basic Metabolic Panel:  Recent Labs Lab 11/14/16 2350 11/16/16 0430 11/20/16 0501  NA 145 140 140  K 3.8 3.6 3.7  CL 112* 108 113*  CO2 23 26 23   GLUCOSE 151* 98 98  BUN 41* 20 17  CREATININE 1.67* 0.78 0.85  CALCIUM 9.3 8.7* 8.5*   GFR: Estimated Creatinine Clearance: 107.1 mL/min (by C-G  formula based on SCr of 0.85 mg/dL). Liver Function Tests:  Recent Labs Lab 11/14/16 2350  AST 27  ALT 29  ALKPHOS 100  BILITOT 0.4  PROT 9.0*  ALBUMIN 4.9    Recent Labs Lab 11/14/16 2350  LIPASE 18   No results for input(s): AMMONIA in the last 168 hours. Coagulation Profile:  Recent Labs Lab 11/16/16 0430 11/17/16 0415 11/18/16 0500 11/19/16 0503 11/20/16 0501  INR 2.15 1.27 1.46 1.56 1.90   Cardiac Enzymes: No results for input(s): CKTOTAL, CKMB, CKMBINDEX, TROPONINI in the last 168 hours. BNP (last 3 results) No results for input(s): PROBNP in the last 8760 hours. HbA1C: No results for input(s): HGBA1C in the last 72 hours. CBG: No results for input(s): GLUCAP in the last 168 hours. Lipid Profile: No results for input(s): CHOL, HDL, LDLCALC, TRIG, CHOLHDL, LDLDIRECT in the last 72 hours. Thyroid Function Tests: No results for input(s): TSH, T4TOTAL, FREET4, T3FREE, THYROIDAB in the last 72 hours. Anemia Panel: No results for input(s): VITAMINB12, FOLATE, FERRITIN, TIBC, IRON, RETICCTPCT in the last 72 hours. Sepsis Labs: No results for input(s): PROCALCITON, LATICACIDVEN in the last 168 hours.  Recent Results (from the past 240 hour(s))  Urine  culture     Status: None   Collection Time: 11/14/16 11:50 PM  Result Value Ref Range Status   Specimen Description URINE, CATHETERIZED  Final   Special Requests NONE  Final   Culture NO GROWTH Performed at [facility]   Final   Report Status 11/16/2016 FINAL  Final         Radiology Studies: No results found.      Scheduled Meds: . dalfampridine  10 mg Oral BID  . enoxaparin (LOVENOX) injection  90 mg Subcutaneous Q12H  . gabapentin  600 mg Oral QID  . levETIRAcetam  500 mg Oral BID  . pantoprazole  80 mg Oral Daily  . polyethylene glycol  17 g Oral Daily  . senna-docusate  1 tablet Oral BID  . sertraline  50 mg Oral Daily  . sodium chloride flush  10-40 mL Intracatheter Q12H  .  topiramate  50 mg Oral BID  . verapamil  40 mg Oral BID  . warfarin  5 mg Oral ONCE-1800  . Warfarin - Pharmacist Dosing Inpatient   Does not apply q1800   Continuous Infusions: . sodium chloride 125 mL/hr at 11/20/16 0412     LOS: 5 days    Time spent: 40 minutes    Hendry Speas, MD [facility] Pager [phone]  If 7PM-7AM, please contact night-coverage [email or web address] Password [email or web address] 11/20/2016, 11:20 AM

## 2016-11-20 NOTE — Progress Notes (Signed)
ANTICOAGULATION CONSULT NOTE  Pharmacy Consult for Warfarin + Lovenox Indication: Hx DVT  No Known Allergies  Patient Measurements: Height: 5\' 9"  (175.3 cm) Weight: 196 lb (88.9 kg) IBW/kg (Calculated) : 70.7  Vital Signs: Temp: 97.6 F (36.4 C) (01/11 0622) Temp Source: Oral (01/11 0622) BP: 109/73 (01/11 0622) Pulse Rate: 64 (01/11 0622)  Labs:  Recent Labs  11/18/16 0500 11/19/16 0503 11/20/16 0501  LABPROT 17.9* 18.8* 22.0*  INR 1.46 1.56 1.90  CREATININE  --   --  0.85   Estimated Creatinine Clearance: 107.1 mL/min (by C-G formula based on SCr of 0.85 mg/dL).  Medicatio Scheduled:  . dalfampridine  10 mg Oral BID  . enoxaparin (LOVENOX) injection  90 mg Subcutaneous Q12H  . gabapentin  600 mg Oral QID  . levETIRAcetam  500 mg Oral BID  . pantoprazole  80 mg Oral Daily  . polyethylene glycol  17 g Oral Daily  . senna-docusate  1 tablet Oral BID  . sertraline  50 mg Oral Daily  . sodium chloride flush  10-40 mL Intracatheter Q12H  . topiramate  50 mg Oral BID  . verapamil  40 mg Oral BID  . 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  INR on admission was 7.53 with vitamin K 2.5 mg PO x1 given on 11/16/15.  Patient reported no new medications or significant diet changes PTA.  Home warfarin regimen: 5 mg daily 1/9: Lovenox per pharmacy added to bridge until INR therapeutic  Today, 11/20/2016:  INR subtherapeutic but trending up appropriately after boosted dose yesterday  No bleeding documented  No significant drug-drug intxns with current meds (abx d/ced on 1/7)  Heart healthy diet, eating sporadically  Goal of Therapy: INR 2-3 Anti-Xa level 0.6-1 units/ml 4hrs after LMWH dose given  Plan:  Warfarin 5 mg today at 1800  Continue Lovenox 90 mg SQ bid  Have ordered  CBC for today  Monitor for signs of bleeding  Bernadene Person, PharmD, BCPS Pager: [phone] 11/20/2016, 9:56 AM

## 2016-11-20 NOTE — Progress Notes (Signed)
Physical Therapy Treatment Patient Details Name: [name] MRN: [id number] DOB: 11/25/1960 Today's Date: 12-11-2016    History of Present Illness 56 yo male admitted with AKI, abd pain, HA. Hx of multiple sclerosis, cluster HAs, DVT, BPPV, chronic pain.    PT Comments    Pt ambulation improved today both gait pattern and distance.  Pt reports likely d/c home tomorrow.  Follow Up Recommendations  Home health PT;Supervision - Intermittent     Equipment Recommendations  None recommended by PT    Recommendations for Other Services       Precautions / Restrictions Precautions Precautions: Fall Restrictions Weight Bearing Restrictions: No    Mobility  Bed Mobility Overal bed mobility: Needs Assistance Bed Mobility: Supine to Sit     Supine to sit: Supervision        Transfers Overall transfer level: Needs assistance Equipment used: Rolling walker (2 wheeled) Transfers: Sit to/from Stand Sit to Stand: Min guard         General transfer comment: min/guard for safety however pt did not require physical assistance  Ambulation/Gait Ambulation/Gait assistance: Min guard Ambulation Distance (Feet): 240 Feet Assistive device: Rolling walker (2 wheeled) Gait Pattern/deviations: Step-through pattern;Decreased stride length;Wide base of support     General Gait Details: gait improved since last session, pt able to lift feet from floor today, decreased hip/knee flexion of R LE more prominent today, continues to have slow speed however improved   Stairs            Wheelchair Mobility    Modified Rankin (Stroke Patients Only)       Balance                                    Cognition Arousal/Alertness: Awake/alert Behavior During Therapy: WFL for tasks assessed/performed Overall Cognitive Status: Within Functional Limits for tasks assessed                      Exercises      General Comments        Pertinent  Vitals/Pain Pain Assessment: No/denies pain    Home Living                      Prior Function            PT Goals (current goals can now be found in the care plan section) Progress towards PT goals: Progressing toward goals    Frequency    Min 3X/week      PT Plan Current plan remains appropriate    Co-evaluation             End of Session Equipment Utilized During Treatment: Gait belt Activity Tolerance: Patient tolerated treatment well Patient left: in chair;with call bell/phone within reach (bed alarm off entering room, pt has been getting up to [facility] unassisted per his report)     Time: 5631-4970 PT Time Calculation (min) (ACUTE ONLY): 16 min  Charges:  $Gait Training: 8-22 mins                    G Codes:      Freddie Nghiem,KATHrine E 11-Dec-2016, 1:01 PM [name], PT, DPT 2016-12-11 Pager: [phone]

## 2016-11-21 DIAGNOSIS — G44029 Chronic cluster headache, not intractable: Secondary | ICD-10-CM

## 2016-11-21 LAB — PROTIME-INR
INR: 2.07
PROTHROMBIN TIME: 23.7 s — AB (ref 11.4–15.2)

## 2016-11-21 LAB — BASIC METABOLIC PANEL
Anion gap: 4 — ABNORMAL LOW (ref 5–15)
BUN: 14 mg/dL (ref 6–20)
CALCIUM: 8.3 mg/dL — AB (ref 8.9–10.3)
CHLORIDE: 116 mmol/L — AB (ref 101–111)
CO2: 22 mmol/L (ref 22–32)
CREATININE: 0.76 mg/dL (ref 0.61–1.24)
GFR calc non Af Amer: >60 mL/min (ref >60)
GLUCOSE: 86 mg/dL (ref 65–99)
Potassium: 3.7 mmol/L (ref 3.5–5.1)
Sodium: 142 mmol/L (ref 135–145)

## 2016-11-21 MED ORDER — HEPARIN SOD (PORK) LOCK FLUSH 100 UNIT/ML IV SOLN
500.0000 [IU] | INTRAVENOUS | Status: AC | PRN
  Administered 2016-11-21: 500 [IU]

## 2016-11-21 MED ORDER — WARFARIN SODIUM 5 MG PO TABS: 5.0000 mg | ORAL_TABLET | Freq: Once | ORAL | Status: AC

## 2016-11-21 MED ORDER — POLYETHYLENE GLYCOL 3350 17 G PO PACK
17.0000 g | PACK | Freq: Every day | ORAL | 0 refills | Status: DC
Start: 2016-11-22 — End: 2017-05-01

## 2016-11-21 MED ORDER — SENNOSIDES-DOCUSATE SODIUM 8.6-50 MG PO TABS: 1.0000 | ORAL_TABLET | Freq: Two times a day (BID) | ORAL | Status: DC

## 2016-11-21 NOTE — Discharge Summary (Signed)
Physician Discharge Summary  [name] ZOX:[id number] DOB: [birth year] DOA: 11/14/2016  PCP: Dorrene German, MD  Admit date: 11/14/2016 Discharge date: 11/21/2016  Time spent: 65 minutes  Recommendations for Outpatient Follow-up:  1. Follow-up with Dorrene German, MD in 1 week. On follow-up patient in need a basic metabolic profile done to follow-up on electrolytes and renal function. Patient will need follow-up on his Coumadin. 2. Patient will have home health RN check a PT/INR on Tuesday or Wednesday, January 16 or 11/26/2016 and results need to be faxed to patient's PCP for further recommendations. INR on day of discharge was 2.07.   Discharge Diagnoses:  Principal Problem:   Chronic cluster headache, not intractable Active Problems:   PULMONARY EMBOLISM, HX OF   Multiple sclerosis (HCC)   DVT (deep venous thrombosis) (HCC)   Depression   GERD (gastroesophageal reflux disease)   Essential hypertension   Gastroesophageal reflux disease without esophagitis   AKI (acute kidney injury) (HCC)   Chronic hepatitis C virus infection (HCC)   Chronic abdominal pain   Chronic pain syndrome   Acute retention of urine   Discharge Condition: Stable and improved  Diet recommendation: Heart healthy  Filed Weights   11/14/16 2020 11/14/16 2021  Weight: 89.8 kg (198 lb) 88.9 kg (196 lb)    History of present illness:  Per Dr. Virgil Benedict is a 56 y.o. male with medical history of multiple sclerosis, hypertension, chronic hepatitis C, chronic pain syndrome, DVT left arm, depression presents with 2 day history of abdominal pain, "cluster headache", and dyspnea. The patient normally is able to make transfers but essentially was nonambulatory at baseline. The patient had been complaining of dysuria and difficulty urinating for a period of 2 days with associated abdominal pain that was generalized. He denied any fevers, chills, nausea, vomiting, diarrhea. He normally is able to urinate  spontaneously without difficulty. In the emergency department, the patient was found to have urinary retention and a Foley catheter was inserted which drained > 800 cc  of urine. Thereafter, the patient stated that his abdominal pain was a low but better but continued to have generalized pain in his abdomen. He stated that he has had poor oral intake for 2 days, but in the emergency department he was able to eat 2 sandwiches and drank numerous cans of soda. He denied any hematochezia, melena, but complains of constipation. In addition, the patient initially complained of a "cluster headache "behind his right eye. The patient was given Benadryl, Dilaudid, and Benadryl with some relief in the emergency department. Interestingly later in the examination, the patient complained of pain all over his head even with a feather touch. He denied any visual disturbance, but complained of some increased weakness of his upper extremities with numbness and tingling in his hands in the past 2 days. He denied any other worsening of his lower extremities or increased dysesthesias in his lower extremities or other parts of his body. Finally, the patient had been complaining of shortness of breath with exertion over the past 2 days. He continues to smoke one half pack per day, but has had a history of 20-pack-years. He denied any coughing, sore throat, fevers, chills, hemoptysis. He endorses compliance with all his medications   In the emergency department, the patient was afebrile and hemodynamically stable saturating 96-99% on room air. BMP showed serum creatinine 1.67. Otherwise his last lites were unremarkable. Hepatic panel was unremarkable. CBC showed WBC 12.2.  Urinalysis showed 6-30 WBC with  glycosuria. CT angiogram of the chest was negative for PE, edema, or infiltrates. CT of the abdomen and pelvis was negative for any acute findings. The patient was started on IV fluids and ceftriaxone. EDP consult to neurology who  recommended admission for IV antibiotics and hydration. If the patient's symptoms are not improved, then MRI of the brain and cervical spine with contrast would be recommended.  Hospital Course:  #1 chronic cluster headaches Patient was admitted with complaints of cluster headaches. Patient stated had been headache free for almost 2 years until this episode.  MRI brain with no significant changes. MRI C-spine due to motion unable to assess for any changes. Spoke with neurology on-call for further evaluation and management. Neurology recommended placing patient on high flow O2 every 6 hours approximate 30 minutes her session and if no improvement in the next 24 hours may consider starting patient on Tegretol 200 mg 3 times daily. Patient was placed on high flow O2 with significant clinical improvement. Patient was also seen by neurology and has patient had improved significantly was felt patient was stable for discharge with close outpatient follow-up with his neurologist. Patient will be discharged in stable condition.   #2 history of PE/DVT INR was initially reversed due to a supratherapeutic INR 7.52 on admission. INR was subsequently subtherapeutic and patient was placed on a Lovenox bridge and Coumadin resumed. By day of discharge patient's INR was therapeutic at 2.07. Patient to have home health RN draw PT/INR on Tuesday or Wednesday January 16th or 17th 2018 and results to be sent to patient's PCP.  #3 supratherapeutic INR INR 7.52 on admission. Status post vitamin K 2.5 mg by mouth 1. Patient was placed back on Coumadin and INR followed. Patient's INR was therapeutic by day of discharge. Patient will have home health RN check a PT/INR on Tuesday or Wednesday, January 16 or 17th 2018.   #4 urinary retention Status post Foley catheter placement which was subsequently removed. Patient with good urine output.  #5 acute kidney injury Resolved with hydration.  #6 chronic abdominal  pain/chronic pain syndrome Patient status post extensive evaluation including colonoscopy, MRCP, multiple CTs of his abdomen and pelvis all which have been unremarkable. Patient likely component of constipation. Patient stated has had multiple bowel movements during this hospitalization and does not feel constipated. Minimized opiates. Abdominal pain improved back to his baseline by day of discharge.   #7 chronic hepatitis C Patient stated had stopped taking at [facility] at home after discussion with neurologist/PCP it was felt this may have been causing side effects of headache.    Procedures:  MRI C-spine 11/17/2016  MRI brain 11/16/2016  Acute abdominal series 11/15/2016  Renal ultrasound 11/15/2016  Consultations:  Neurology  Discharge Exam: Vitals:   11/20/16 2200 11/21/16 0635  BP: 113/78 115/72  Pulse: 65 68  Resp: 16 18  Temp: 97.7 F (36.5 C) 98.2 F (36.8 C)    General: NAD Cardiovascular: RRR Respiratory: CTAB  Discharge Instructions   Discharge Instructions    Diet - low sodium heart healthy    Complete by:  As directed    Increase activity slowly    Complete by:  As directed      Current Discharge Medication List    START taking these medications   Details  polyethylene glycol (MIRALAX / GLYCOLAX) packet Take 17 g by mouth daily. Qty: 14 each, Refills: 0    promethazine (PHENERGAN) 25 MG tablet Take 1 tablet (25 mg total) by mouth every  6 (six) hours as needed (headache). Qty: 10 tablet, Refills: 0    senna-docusate (SENOKOT-S) 8.6-50 MG tablet Take 1 tablet by mouth 2 (two) times daily.      CONTINUE these medications which have NOT CHANGED   Details  baclofen (LIORESAL) 20 MG tablet Take 20 mg by mouth 4 (four) times daily as needed for muscle spasms.     dalfampridine (AMPYRA) 10 MG TB12 Take 10 mg by mouth 2 (two) times daily.    Eszopiclone (ESZOPICLONE) 3 MG TABS Take 3 mg by mouth at bedtime as needed (for sleep).     gabapentin  (NEURONTIN) 600 MG tablet Take 600 mg by mouth 4 (four) times daily.     levETIRAcetam (KEPPRA) 500 MG tablet Take 500 mg by mouth 2 (two) times daily. Refills: 5    oxyCODONE-acetaminophen (PERCOCET) 5-325 MG tablet Take 1 tablet by mouth every 4 (four) hours as needed for severe pain. Qty: 20 tablet, Refills: 0    sertraline (ZOLOFT) 50 MG tablet Take 50 mg by mouth daily.     Sofosbuvir-Velpatasvir (EPCLUSA) 400-100 MG TABS Take 1 tablet by mouth daily. Qty: 30 tablet, Refills: 2    topiramate (TOPAMAX) 50 MG tablet Take 50 mg by mouth 2 (two) times daily. Refills: 5    triamterene-hydrochlorothiazide (DYAZIDE) 37.5-25 MG capsule     verapamil (CALAN) 80 MG tablet Take 0.5 tablets (40 mg total) by mouth 2 (two) times daily.    warfarin (COUMADIN) 5 MG tablet Take 5 mg by mouth daily.    omeprazole (PRILOSEC) 40 MG capsule Take 40 mg by mouth daily. Refills: 4      STOP taking these medications     hydrochlorothiazide (HYDRODIURIL) 25 MG tablet      lisinopril-hydrochlorothiazide (PRINZIDE,ZESTORETIC) 10-12.5 MG per tablet      Cefixime (SUPRAX) 400 MG CAPS capsule      enoxaparin (LOVENOX) 120 MG/0.8ML injection      ondansetron (ZOFRAN ODT) 8 MG disintegrating tablet      predniSONE (DELTASONE) 10 MG tablet        No Known Allergies Follow-up Information    Schedule an appointment as soon as possible for a visit with Dorrene German, MD.   Specialty:  Internal Medicine Contact information: [address] [place] [address] [phone]        [facility] Schedule an appointment as soon as possible for a visit in 3 day(s).   Contact information: [address]  [place] [address] [place] [address] [phone]        [facility] Follow up.   Why:  home health nurse for INR checks (and will call to your primary care physician office for dosing) and physical therapist Contact information: [address] [place] [address] [phone]        neurology Follow up.   Why:  f/u with your neurologist as scheduled.           The results of significant diagnostics from this hospitalization (including imaging, microbiology, ancillary and laboratory) are listed below for reference.    Significant Diagnostic Studies: Ct Abdomen Pelvis Wo Contrast  Result Date: 11/15/2016 CLINICAL DATA:  New onset generalized abdominal pain EXAM: CT ABDOMEN AND PELVIS WITHOUT CONTRAST TECHNIQUE: Multidetector CT imaging of the abdomen and pelvis was performed following the standard protocol without IV contrast. COMPARISON:  05/14/2014 FINDINGS: Lower chest: No acute abnormality. Hepatobiliary: No focal liver abnormality is seen. Status post cholecystectomy. No biliary  dilatation. Pancreas: Unremarkable. No pancreatic ductal dilatation or surrounding inflammatory changes. Spleen: Normal in size without focal abnormality. Adrenals/Urinary Tract: Adrenal glands are unremarkable. Kidneys are normal, without renal calculi, focal lesion, or hydronephrosis. Bladder is decompressed around a Foley catheter. Stomach/Bowel: Stomach is within normal limits. Appendix is normal. No evidence of bowel wall thickening, distention, or inflammatory changes. Vascular/Lymphatic: The abdominal aorta is normal in caliber. Mild atherosclerotic calcification. No adenopathy in the abdomen or pelvis. Reproductive: Unremarkable Other: No acute inflammation.  No ascites. Musculoskeletal: No significant skeletal lesion. IMPRESSION: No significant abnormality Electronically Signed   By: Ellery Plunk M.D.   On: 11/15/2016 06:25   Ct Angio Chest Pe W Or Wo Contrast  Result Date: 11/15/2016 CLINICAL DATA:  New shortness of breath with history of pulmonary embolus. EXAM: CT ANGIOGRAPHY CHEST WITH CONTRAST TECHNIQUE: Multidetector CT imaging of the chest was performed using the standard protocol during bolus administration of intravenous contrast.  Multiplanar CT image reconstructions and MIPs were obtained to evaluate the vascular anatomy. CONTRAST:  75 mL of Isovue 370 intravenous contrast. COMPARISON:  Chest CT, 02/16/2016.  Current abdomen pelvis CT. FINDINGS: Cardiovascular: Satisfactory opacification of the pulmonary arteries to the segmental level. No evidence of pulmonary embolism. Normal heart size. No pericardial effusion. No coronary artery calcifications. Great vessels are normal in caliber. No aortic dissection. No significant atherosclerotic plaque. Mediastinum/Nodes: No enlarged mediastinal, hilar, or axillary lymph nodes. Thyroid gland, trachea, and esophagus demonstrate no significant findings. Lungs/Pleura: Mild atelectasis in the left lower lobe with minimal linear atelectasis in the right lung base in the right lower lobe. Mild paraseptal emphysema in the medial right upper lobe at the apex. Lungs otherwise clear. No pleural effusion. No pneumothorax. Upper Abdomen: No acute abnormality. Musculoskeletal: No acute abnormality. 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 No osteoblastic or osteolytic lesions. Review of the MIP images confirms the above findings. IMPRESSION: 1. No evidence of a pulmonary embolus. 2. No acute findings.  No evidence of pneumonia or pulmonary edema. Electronically Signed   By: Amie Portland M.D.   On: 11/15/2016 08:51   Mr Brain Wo Contrast  Result Date: 11/16/2016 CLINICAL DATA:  Multiple sclerosis EXAM: MRI HEAD WITHOUT CONTRAST TECHNIQUE: Multiplanar, multiecho pulse sequences of the brain and surrounding structures were obtained without intravenous contrast. COMPARISON:  MRI head 10/14/2016 FINDINGS: Brain: The majority of the study was performed albeit with motion. The patient was not able to complete all the sequences. No contrast was administered. Ventricle size normal. Cerebral volume normal. Negative for acute infarct. No areas of restricted diffusion. Small periventricular  and deep white matter hyperintensities bilaterally unchanged from the prior study. Brainstem and cerebellum normal. No mass lesion. Pituitary normal in size. Vascular: Normal arterial flow voids. Skull and upper cervical spine: Negative Sinuses/Orbits: Negative Other: None IMPRESSION: Image quality degraded by motion. The patient was not able to complete the study. No change from 1 month ago. Small periventricular and deep white matter hyperintensities are nonspecific and could be seen with chronic microvascular ischemia, migraine headache, demyelinating disease among other etiologies. Electronically Signed   By: Marlan Palau M.D.   On: 11/16/2016 17:15   Mr Cervical Spine Wo Contrast  Result Date: 11/17/2016 CLINICAL DATA:  56 year old male with history multiple sclerosis in bilateral lower extremity weakness and urinary incontinence. Questioned MS flare. Subsequent encounter. EXAM: MRI CERVICAL SPINE WITHOUT CONTRAST TECHNIQUE: Multiplanar, multisequence MR imaging of the cervical spine was performed. No intravenous contrast was administered. COMPARISON:  11/16/2016  and brain MR. 10/04/2014 cervical spine MR. FINDINGS: Exam is significantly motion degraded despite 2 attempts and Ativan. Alignment: Within normal limits. Vertebrae: No obvious osseous abnormality. Cord: Previous exam was also motion degraded although less so than on the present examination. On the prior exam areas of possible demyelination of the cervical cord noted on the left from the C2 through upper C5 level and on the right at the C6, C6-7 level and T1 level. Secondary to motion degradation it is not possible to determine if there has been an interval change. Disc levels: Cervical spondylotic changes C3-4 thru C5-6 once again noted but not adequately assessed secondary to motion artifact. IMPRESSION: Secondary to significant motion degradation evaluating for new demyelinating process or evaluating for progression of degenerative changes is  not possible. Please see above Electronically Signed   By: Lacy Duverney M.D.   On: 11/17/2016 18:38   US Renal  Result Date: 11/15/2016 CLINICAL DATA:  Acute renal injury EXAM: RENAL / URINARY TRACT ULTRASOUND COMPLETE COMPARISON:  CT scan from yesterday FINDINGS: Right Kidney: Length: 13.5 cm.  1.4 cm cyst in the right kidney. Left Kidney: Length: 13.8 cm. Echogenicity within normal limits. No mass or hydronephrosis visualized. Bladder: The bladder is decompressed with a Foley catheter. IMPRESSION: No acute abnormalities identified.  Right renal cyst. Electronically Signed   By: Gerome Sam III M.D   On: 11/15/2016 12:59   Dg Abd Acute W/chest  Result Date: 11/15/2016 CLINICAL DATA:  Initial evaluation for acute generalized abdominal pain. EXAM: DG ABDOMEN ACUTE W/ 1V CHEST COMPARISON:  Prior MRI from 10/14/2016 as well as previous radiograph from 02/15/2016. FINDINGS: Accessed right Port-A-Cath in place with tip overlying the cavoatrial junction. Cardiac and mediastinal silhouettes within normal limits. Mild elevation left hemidiaphragm. No focal infiltrates. No pulmonary edema or pleural effusion. No pneumothorax. Bowel gas pattern within normal limits without evidence for obstruction or ileus. Mild gaseous distension of the stomach. No abnormal bowel wall thickening. Moderate amount of retained stool within the colon. No soft tissue mass. Patient is status post cholecystectomy. Scattered vascular calcifications noted. Degenerative changes noted within the spine. IMPRESSION: 1. Mild gaseous distension of the stomach. 2. Otherwise nonobstructive bowel gas pattern with no radiographic evidence for acute intra-abdominal process. 3. Moderate amount of retained stool throughout the colon. 4. Mild elevation left hemidiaphragm. No other active cardiopulmonary disease identified. Electronically Signed   By: Rise Mu M.D.   On: 11/15/2016 00:25    Microbiology: Recent Results (from the past 240  hour(s))  Urine culture     Status: None   Collection Time: 11/14/16 11:50 PM  Result Value Ref Range Status   Specimen Description URINE, CATHETERIZED  Final   Special Requests NONE  Final   Culture NO GROWTH Performed at [facility]   Final   Report Status 11/16/2016 FINAL  Final     Labs: Basic Metabolic Panel:  Recent Labs Lab 11/14/16 2350 11/16/16 0430 11/20/16 0501 11/21/16 0500  NA 145 140 140 142  K 3.8 3.6 3.7 3.7  CL 112* 108 113* 116*  CO2 23 26 23 22   GLUCOSE 151* 98 98 86  BUN 41* 20 17 14   CREATININE 1.67* 0.78 0.85 0.76  CALCIUM 9.3 8.7* 8.5* 8.3*   Liver Function Tests:  Recent Labs Lab 11/14/16 2350  AST 27  ALT 29  ALKPHOS 100  BILITOT 0.4  PROT 9.0*  ALBUMIN 4.9    Recent Labs Lab 11/14/16 2350  LIPASE 18  No results for input(s): AMMONIA in the last 168 hours. CBC:  Recent Labs Lab 11/14/16 2350 11/16/16 0430 11/20/16 1104  WBC 12.2* 11.1* 8.2  HGB 15.5 12.4* 11.3*  HCT 47.2 38.1* 34.8*  MCV 80.4 80.4 78.9  PLT 362 306 300   Cardiac Enzymes: No results for input(s): CKTOTAL, CKMB, CKMBINDEX, TROPONINI in the last 168 hours. BNP: BNP (last 3 results) No results for input(s): BNP in the last 8760 hours.  ProBNP (last 3 results) No results for input(s): PROBNP in the last 8760 hours.  CBG: No results for input(s): GLUCAP in the last 168 hours.     SignedRamiro Harvest MD.  [facility] 11/21/2016, 1:31 PM

## 2016-11-21 NOTE — Progress Notes (Signed)
Subjective: Patient currently having no headache. The oxygen has improved his symptoms significantly. Patient does see a primary neurologist as an outpatient up in [place].  Exam: Vitals:   11/20/16 2200 11/21/16 0635  BP: 113/78 115/72  Pulse: 65 68  Resp: 16 18  Temp: 97.7 F (36.5 C) 98.2 F (36.8 C)        Gen: In bed, NAD MS: Alert and oriented following all commands CN: 2 through 12 intact Motor: During all extremities well Sensory: Intact   Pertinent Labs/Diagnostics: None  Felicie Morn PA-C [facility] [phone]  Impression: 56 year old male with breakthrough and cluster headaches. Patient has been started on high flow oxygen and has had significant improvement. At this time no further recommendations. Patient should follow-up with his primary neurologist which she has improvement with this month on the 31st. No further recognition per neurology neurologist sign off thank very much      11/21/2016, 10:03 AM

## 2016-11-21 NOTE — Progress Notes (Signed)
[facility] aware of PT, INR  Draw for Tuesday.

## 2016-11-21 NOTE — Progress Notes (Addendum)
ANTICOAGULATION CONSULT NOTE  Pharmacy Consult for Warfarin + Lovenox Indication: Hx DVT  No Known Allergies  Patient Measurements: Height: 5\' 9"  (175.3 cm) Weight: 196 lb (88.9 kg) IBW/kg (Calculated) : 70.7  Vital Signs: Temp: 98.2 F (36.8 C) (01/12 0635) Temp Source: Oral (01/12 0635) BP: 115/72 (01/12 0635) Pulse Rate: 68 (01/12 0635)  Labs:  Recent Labs  11/19/16 0503 11/20/16 0501 11/20/16 1104 11/21/16 0500  HGB  --   --  11.3*  --   HCT  --   --  34.8*  --   PLT  --   --  300  --   LABPROT 18.8* 22.0*  --  23.7*  INR 1.56 1.90  --  2.07  CREATININE  --  0.85  --  0.76   Estimated Creatinine Clearance: 113.8 mL/min (by C-G formula based on SCr of 0.76 mg/dL).  Medicatio Scheduled:  . dalfampridine  10 mg Oral BID  . enoxaparin (LOVENOX) injection  90 mg Subcutaneous Q12H  . gabapentin  600 mg Oral QID  . levETIRAcetam  500 mg Oral BID  . pantoprazole  80 mg Oral Daily  . polyethylene glycol  17 g Oral Daily  . senna-docusate  1 tablet Oral BID  . sertraline  50 mg Oral Daily  . sodium chloride flush  10-40 mL Intracatheter Q12H  . topiramate  50 mg Oral BID  . verapamil  40 mg Oral BID  . 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  INR on admission was 7.53 with vitamin K 2.5 mg PO x1 given on 11/16/15.  Patient reported no new medications or significant diet changes PTA.  Home warfarin regimen: 5 mg daily 1/9: Lovenox per pharmacy added to bridge until INR therapeutic  Today, 11/21/2016:  INR now therapeutic, rising appropriately  CBC with Hgb low but stable, Plt wnl  No bleeding documented  No significant drug-drug intxns with current meds (abx d/ced on 1/7)  Heart healthy diet, eating sporadically  Goal of Therapy: INR 2-3 Anti-Xa level 0.6-1 units/ml  4hrs after LMWH dose given  Plan:  Warfarin 5 mg today at 1800  Lovenox 90 mg SQ q12 hr; consider stopping today unless very high risk for VTE recurrence  If discharging today, recommend resuming home warfarin dose (without Lovenox) with INR check in 5 days  Daily INR, CBC q72 hr while admitted  Monitor for signs of bleeding  Bernadene Person, PharmD, BCPS Pager: [phone] 11/21/2016, 11:01 AM

## 2016-11-24 IMAGING — CR DG CHEST 1V
1 series · 1 of 1 positions shown · non-contrast
Comparison: 05/28/2015.

CLINICAL DATA: Pre porta catheter placement evaluation.

EXAM:
CHEST  1 VIEW

[x chest ap]
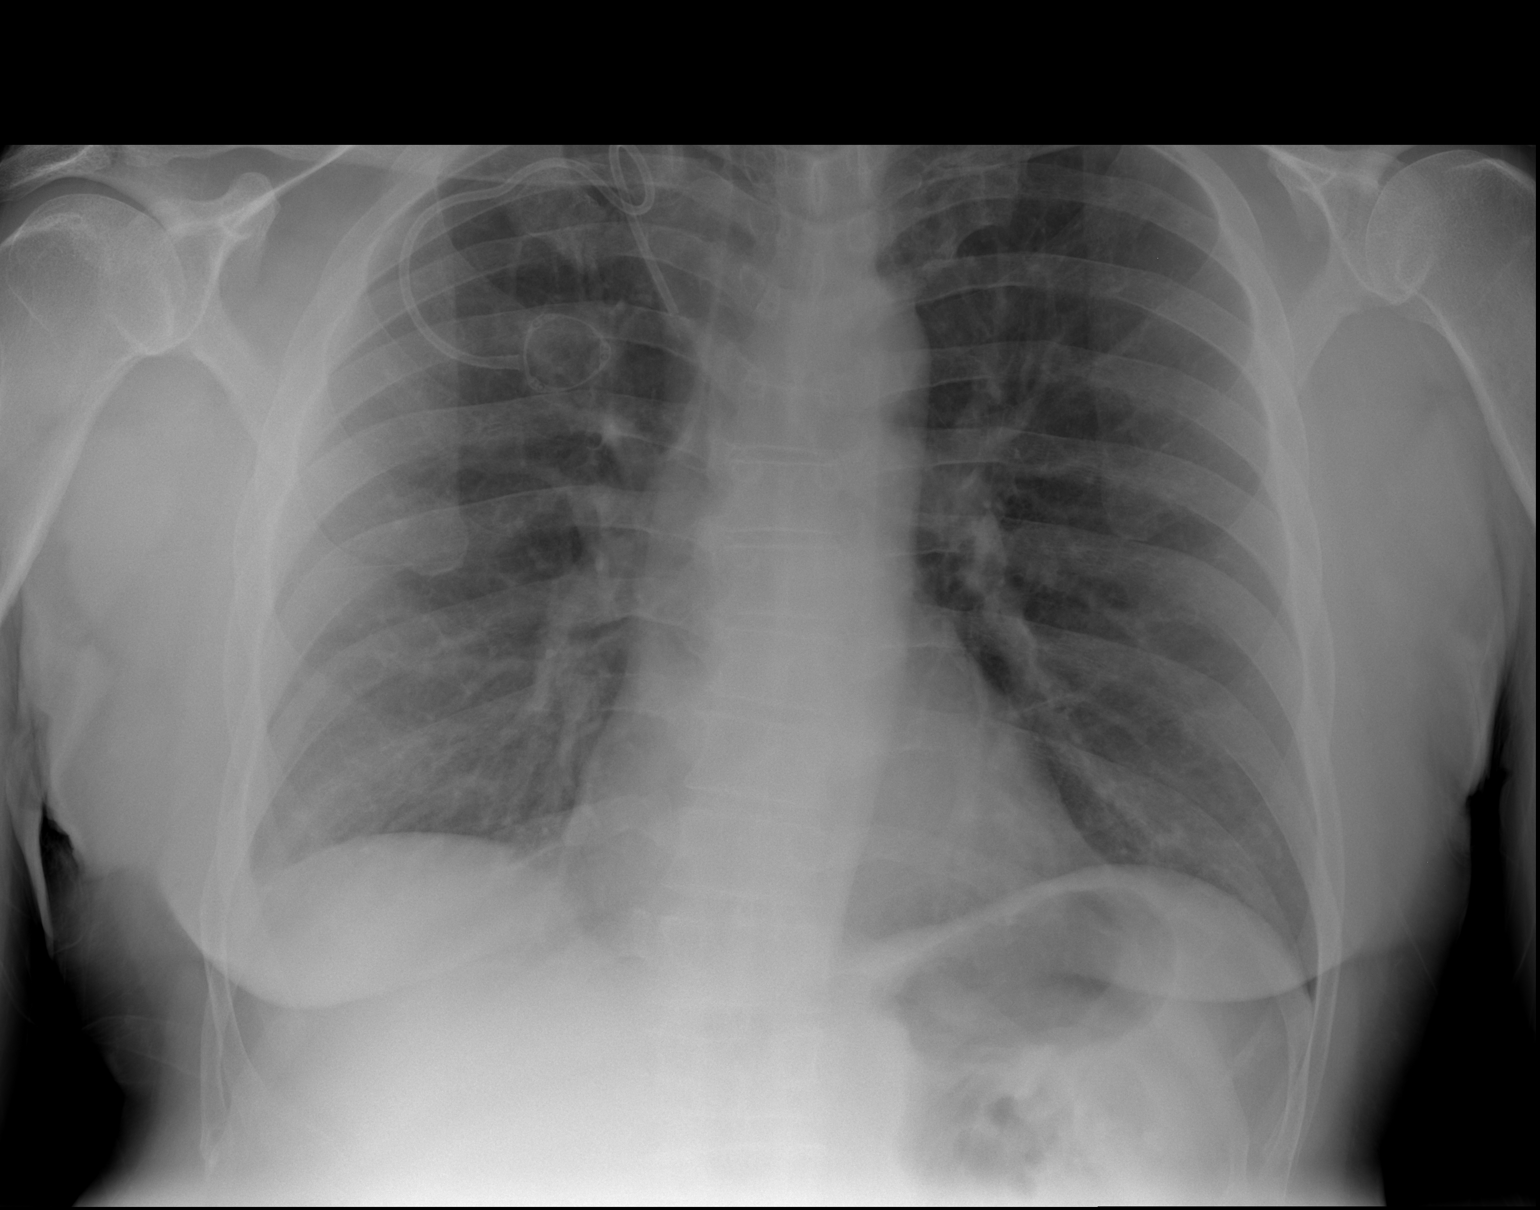

[1 of 1 positions shown; findings below may reference images not displayed]

FINDINGS: The right subclavian port catheter tubing remains coiled in the
right subclavian region with its tip in the region of the right
innominate vein. Normal sized heart. Clear lungs. Mild scoliosis.
IMPRESSION: Stable malpositioned right porta catheter.

## 2016-11-27 IMAGING — CR DG CHEST 1V PORT
1 series · 1 of 1 positions shown · non-contrast
Comparison: 07/03/2015

CLINICAL DATA: Port-A-Cath placement

EXAM:
PORTABLE CHEST - 1 VIEW

[AP]
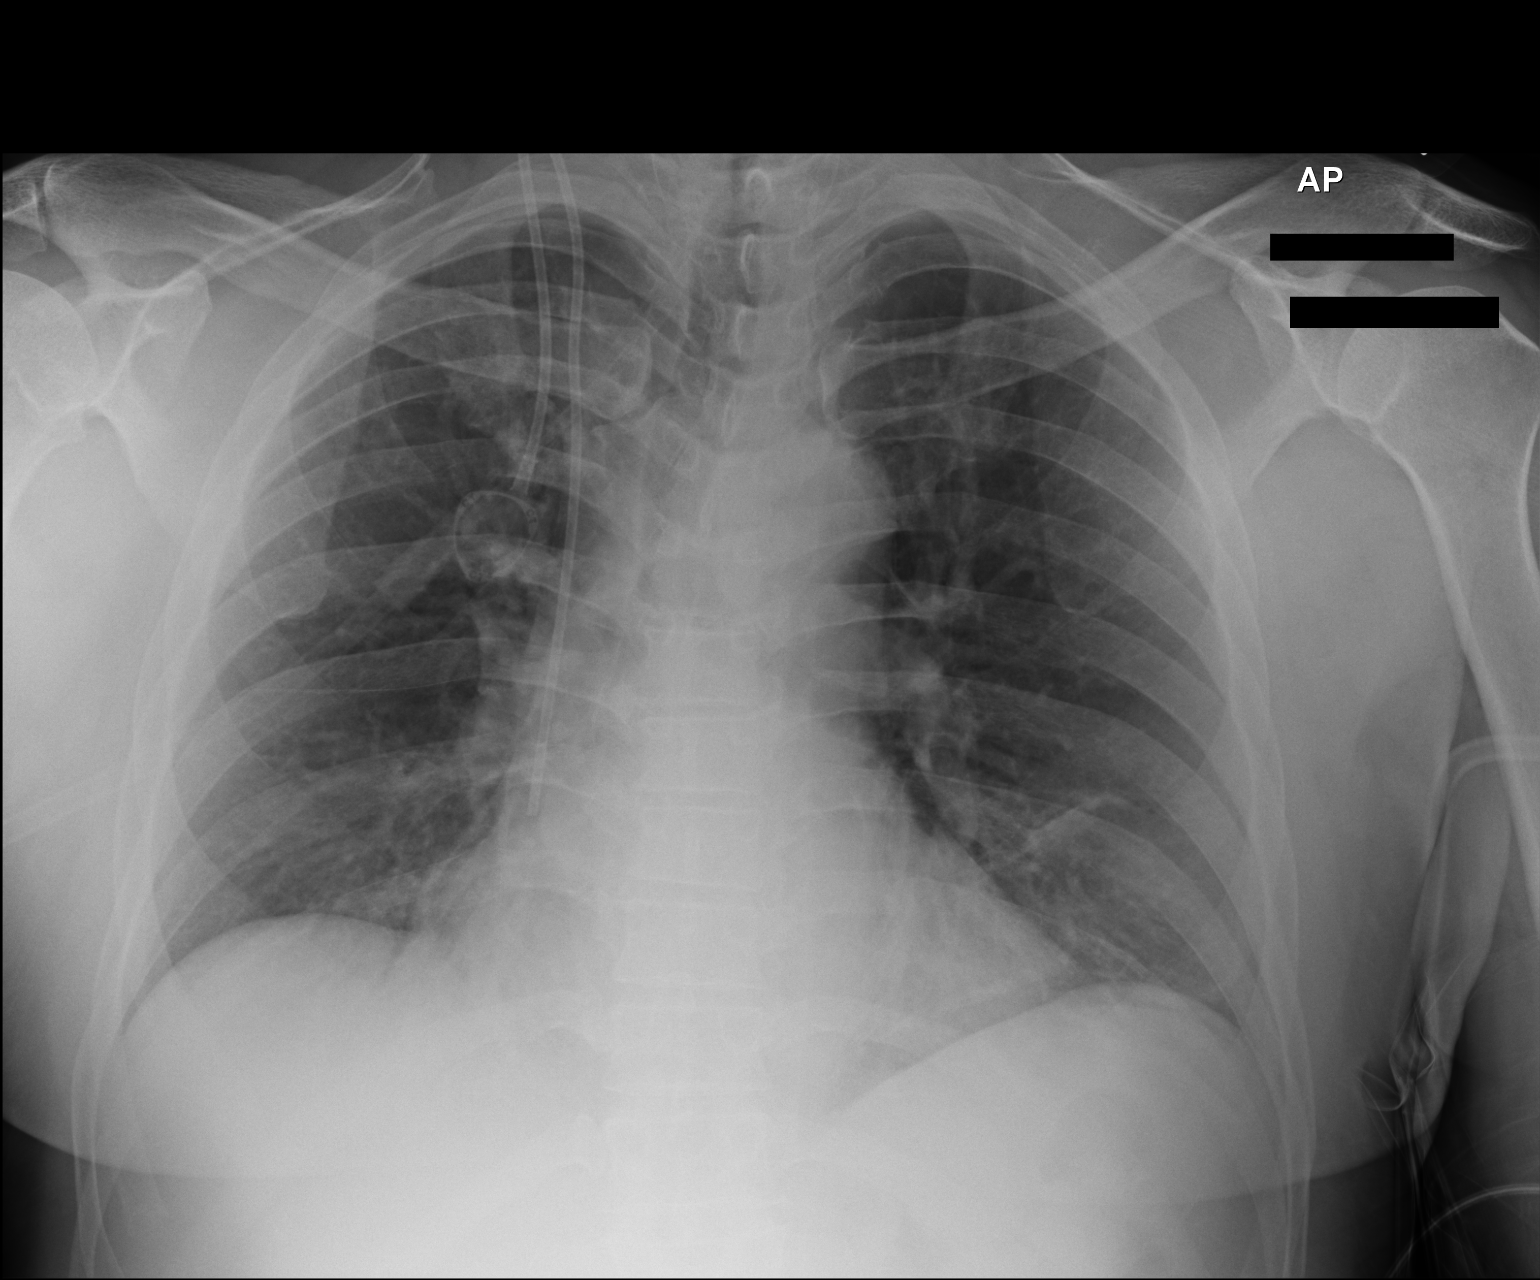

[1 of 1 positions shown; findings below may reference images not displayed]

FINDINGS: There is a right chest wall port a catheter with tip in the
cavoatrial junction. No pneumothorax identified. Heart size is
normal. No pleural effusion or edema. Atelectasis versus scar noted
in the left base.
IMPRESSION: 1. Porta catheter tip is in the cavoatrial junction. No
pneumothorax.

## 2016-11-28 ENCOUNTER — Encounter (HOSPITAL_COMMUNITY): Payer: Self-pay

## 2016-11-28 ENCOUNTER — Emergency Department (HOSPITAL_COMMUNITY)
Admission: EM | Admit: 2016-11-28 | Discharge: 2016-11-29 | Disposition: A | Discharge status: Home / Self Care | Payer: Medicare HMO | Source: Home / Self Care | Attending: Emergency Medicine | Admitting: Emergency Medicine

## 2016-11-28 ENCOUNTER — Ambulatory Visit: Payer: Medicare HMO

## 2016-11-28 DIAGNOSIS — I10 Essential (primary) hypertension: Secondary | ICD-10-CM

## 2016-11-28 DIAGNOSIS — Z86718 Personal history of other venous thrombosis and embolism: Secondary | ICD-10-CM | POA: Diagnosis not present

## 2016-11-28 DIAGNOSIS — F17210 Nicotine dependence, cigarettes, uncomplicated: Secondary | ICD-10-CM | POA: Insufficient documentation

## 2016-11-28 DIAGNOSIS — Z7901 Long term (current) use of anticoagulants: Secondary | ICD-10-CM | POA: Insufficient documentation

## 2016-11-28 DIAGNOSIS — G44011 Episodic cluster headache, intractable: Secondary | ICD-10-CM | POA: Insufficient documentation

## 2016-11-28 DIAGNOSIS — R51 Headache: Secondary | ICD-10-CM | POA: Diagnosis present

## 2016-11-28 DIAGNOSIS — Z79899 Other long term (current) drug therapy: Secondary | ICD-10-CM | POA: Insufficient documentation

## 2016-11-28 DIAGNOSIS — G35 Multiple sclerosis: Secondary | ICD-10-CM | POA: Diagnosis not present

## 2016-11-28 DIAGNOSIS — G44021 Chronic cluster headache, intractable: Secondary | ICD-10-CM | POA: Insufficient documentation

## 2016-11-28 LAB — BASIC METABOLIC PANEL
ANION GAP: 7 (ref 5–15)
BUN: 9 mg/dL (ref 6–20)
CALCIUM: 9 mg/dL (ref 8.9–10.3)
CHLORIDE: 108 mmol/L (ref 101–111)
CO2: 25 mmol/L (ref 22–32)
CREATININE: 0.79 mg/dL (ref 0.61–1.24)
GFR calc non Af Amer: >60 mL/min (ref >60)
Glucose, Bld: 106 mg/dL — ABNORMAL HIGH (ref 65–99)
Potassium: 4.5 mmol/L (ref 3.5–5.1)
Sodium: 140 mmol/L (ref 135–145)

## 2016-11-28 LAB — CBC WITH DIFFERENTIAL/PLATELET
BASOS ABS: 0 10*3/uL (ref 0.0–0.1)
BASOS PCT: 0 %
EOS ABS: 0.1 10*3/uL (ref 0.0–0.7)
Eosinophils Relative: 3 %
HCT: 38.5 % — ABNORMAL LOW (ref 39.0–52.0)
HEMOGLOBIN: 12.4 g/dL — AB (ref 13.0–17.0)
Lymphocytes Relative: 46 %
Lymphs Abs: 2.2 10*3/uL (ref 0.7–4.0)
MCH: 26.4 pg (ref 26.0–34.0)
MCHC: 32.2 g/dL (ref 30.0–36.0)
MCV: 82.1 fL (ref 78.0–100.0)
MONOS PCT: 5 %
Monocytes Absolute: 0.3 10*3/uL (ref 0.1–1.0)
NEUTROS PCT: 46 %
Neutro Abs: 2.2 10*3/uL (ref 1.7–7.7)
Platelets: 254 10*3/uL (ref 150–400)
RBC: 4.69 MIL/uL (ref 4.22–5.81)
RDW: 16.3 % — ABNORMAL HIGH (ref 11.5–15.5)
WBC: 4.8 10*3/uL (ref 4.0–10.5)

## 2016-11-28 LAB — VALPROIC ACID LEVEL

## 2016-11-28 MED ORDER — PREDNISONE 10 MG PO TABS: 20.0000 mg | ORAL_TABLET | Freq: Every day | ORAL | 0 refills | Status: DC

## 2016-11-28 MED ORDER — HEPARIN SOD (PORK) LOCK FLUSH 100 UNIT/ML IV SOLN
500.0000 [IU] | Freq: Once | INTRAVENOUS | Status: AC
  Administered 2016-11-28: 500 [IU]
  Filled 2016-11-28: qty 5

## 2016-11-28 MED ORDER — ONDANSETRON 4 MG PO TBDP
4.0000 mg | ORAL_TABLET | Freq: Once | ORAL | Status: AC | PRN
  Administered 2016-11-28: 4 mg via ORAL

## 2016-11-28 MED ORDER — MAGNESIUM SULFATE 2 GM/50ML IV SOLN
2.0000 g | Freq: Once | INTRAVENOUS | Status: AC
  Administered 2016-11-28: 2 g via INTRAVENOUS
  Filled 2016-11-28: qty 50

## 2016-11-28 MED ORDER — ONDANSETRON 4 MG PO TBDP
ORAL_TABLET | ORAL | Status: AC
  Filled 2016-11-28: qty 1

## 2016-11-28 MED ORDER — OXYCODONE-ACETAMINOPHEN 5-325 MG PO TABS: 1.0000 | ORAL_TABLET | Freq: Four times a day (QID) | ORAL | 0 refills | Status: DC | PRN

## 2016-11-28 MED ORDER — OXYCODONE-ACETAMINOPHEN 5-325 MG PO TABS
1.0000 | ORAL_TABLET | Freq: Once | ORAL | Status: AC
  Administered 2016-11-28: 1 via ORAL
  Filled 2016-11-28: qty 1

## 2016-11-28 MED ORDER — SUMATRIPTAN SUCCINATE 6 MG/0.5ML ~~LOC~~ SOLN
6.0000 mg | Freq: Once | SUBCUTANEOUS | Status: AC
  Administered 2016-11-28: 6 mg via SUBCUTANEOUS
  Filled 2016-11-28: qty 0.5

## 2016-11-28 MED ORDER — OXYCODONE-ACETAMINOPHEN 5-325 MG PO TABS
2.0000 | ORAL_TABLET | Freq: Once | ORAL | Status: AC
  Administered 2016-11-28: 2 via ORAL
  Filled 2016-11-28: qty 2

## 2016-11-28 MED ORDER — VALPROATE SODIUM 500 MG/5ML IV SOLN
1000.0000 mg | Freq: Once | INTRAVENOUS | Status: AC
  Administered 2016-11-28: 1000 mg via INTRAVENOUS
  Filled 2016-11-28: qty 10

## 2016-11-28 MED ORDER — METOCLOPRAMIDE HCL 5 MG/ML IJ SOLN
10.0000 mg | Freq: Once | INTRAMUSCULAR | Status: AC
  Administered 2016-11-28: 10 mg via INTRAVENOUS
  Filled 2016-11-28: qty 2

## 2016-11-28 MED ORDER — PREDNISONE 20 MG PO TABS
60.0000 mg | ORAL_TABLET | Freq: Once | ORAL | Status: AC
  Administered 2016-11-28: 60 mg via ORAL
  Filled 2016-11-28: qty 3

## 2016-11-28 NOTE — ED Notes (Signed)
Warm blanket given to pt. In triage 

## 2016-11-28 NOTE — ED Triage Notes (Signed)
Pt. Has a hx. Of cluster headaches and  Was discharged from [facility] last Friday.  Pt. Developed another headache in the lsst 3 days.   He also ran out of oxygen and needs to have that refilled to help with his headache.  Pt. Is also nauseated.  Pt. Is alert and oriented X 4. Skin is warm and dry.

## 2016-11-28 NOTE — ED Notes (Signed)
Pt requesting PTAR, wears continuous O2

## 2016-11-28 NOTE — Care Management (Signed)
ED CM consulted regarding patient home oxygen issue and HH needs. CM met with patient at bedside. Patient reports being on continuous home oxygen but having issue with tank running out quickly. He states, he does not have a oxygen concentratos at home. His oxygen is supplied by [facility] patient says he has enough O2 to last until the weekend CM explained that he can call [facility] to refill tanks  He also states he is experiencing generalized weakness, CM discussed HH PT for reconditioning patient is agreeable with this care option. Patient is currently receiving [place] services with [facility], CM will fax referral to [facility] for the additional [facility] services. Verified patient contact information. Referral faxed.  Patient verbalized understanding teach back done.  ED CM will follow up with patient concerning additional [facility] services  On Monday 1/22.

## 2016-11-28 NOTE — ED Provider Notes (Signed)
[facility]-EMERGENCY DEPT Provider Note   CSN: [id number] Arrival date & time: 11/28/16  1012  History   Chief Complaint Chief Complaint  Patient presents with  . Headache  . Nausea    HPI [name] is a 56 y.o. male.  HPI   The patient has a PMH of cluster headaches and MS/bed bound, comes to the ER for treatment of a cluster headache that started last night. He reports being out of his steroids, pain medications and oxygen. His family has gone to get his oxygen refilled. He was recently admitted for AKI/urinary retention, but says they did not address his headaches sufficiently. He see's a neurologist in [place]. He describes his pain as a 10/10 pain. He says that he typically needs to be admitted when he comes to the  ER with his headaches. He has not had any other symptoms. Denies fevers, N/V/D. Denies weakness, confusion, back pain, neck pain.  Past Medical History:  Diagnosis Date  . Abdominal pain, unspecified site   . Anxiety   . Arthritis   . Benign paroxysmal positional vertigo   . Chronic back pain   . Chronic pain syndrome 01/25/2008  . Cluster headache   . Depression    takes Zoloft daily  . DVT (deep venous thrombosis) (HCC)    in the left arm '09  . Gait abnormality    "uses mobile wheelchair, but is ambulatory"  . Gallstones 02/17/2009   resolved after gallbladder surgery.  [name] GERD (gastroesophageal reflux disease)    takes Omeprazole as needed  . Headache(784.0)    cluster headaches frequently-takes Topamax daily  . History of colonoscopy   . HTN (hypertension)    takes Lisinopril,Verapamil,and Triamterene HCTZ daily  . Insomnia 11/06/2008  . Joint pain   . Joint swelling    03-07-16 "swelling of right wrist" "after a fall-xray done 03-06-16 "no fractures".  . Memory loss    no an issue at present 03-07-16  . Multiple sclerosis (HCC)    Dx. 2005 - Dr. Tinnie Gens follows [place] 4'17 tx. Tysabri monthly IV-[facility] [place].  [name] Nonspecific  elevation of levels of transaminase or lactic acid dehydrogenase (LDH)   . Other specified visual disturbances   . Other syndromes affecting cervical region   . Pneumonia 2009  . Trigeminal neuralgia     history" Multiple sclerosis"    Patient Active Problem List   Diagnosis Date Noted  . AKI (acute kidney injury) (HCC) 11/15/2016  . Chronic hepatitis C virus infection (HCC) 11/15/2016  . Chronic abdominal pain 11/15/2016  . Chronic pain syndrome 11/15/2016  . Acute retention of urine 11/15/2016  . Generalized abdominal pain   . Chronic cluster headache, not intractable   . Community acquired pneumonia   . TB lung, latent   . HCAP (healthcare-associated pneumonia) 02/16/2016  . Hypokalemia 02/16/2016  . Cluster headache   . DVT (deep venous thrombosis) (HCC)   . Depression   . GERD (gastroesophageal reflux disease)   . HTN (hypertension)   . Essential hypertension   . Gastroesophageal reflux disease without esophagitis   . CAP (community acquired pneumonia)   . Multiple sclerosis (HCC) 08/02/2013  . ABDOMINAL BLOATING 10/14/2010  . LOOSE STOOLS 10/14/2010  . PULMONARY EMBOLISM, HX OF 10/14/2010    Past Surgical History:  Procedure Laterality Date  . CHOLECYSTECTOMY  02/20/2009  . COLONOSCOPY WITH PROPOFOL N/A 03/17/2016   Procedure: COLONOSCOPY WITH PROPOFOL;  Surgeon: [name], MD;  Location: [facility] ENDOSCOPY;  Service:  Endoscopy;  Laterality: N/A;  . PORT A CATH REVISION N/A 07/06/2015   Procedure: Removal and replacement of PORT A CATH;  Surgeon: [name], MD;  Location: [facility] OR;  Service: General;  Laterality: N/A;  . PORTACATH PLACEMENT N/A 03/27/2014   Procedure: INSERTION PORT-A-CATH;  Surgeon: [name], MD;  Location: [facility] OR;  Service: General;  Laterality: N/A;       Home Medications    Prior to Admission medications   Medication Sig Start Date End Date Taking? Authorizing Provider  baclofen (LIORESAL) 20 MG tablet Take 20 mg by mouth 4 (four)  times daily as needed for muscle spasms.    Yes Historical Provider, MD  dalfampridine (AMPYRA) 10 MG TB12 Take 10 mg by mouth 2 (two) times daily.   Yes Historical Provider, MD  Eszopiclone (ESZOPICLONE) 3 MG TABS Take 3 mg by mouth at bedtime as needed (for sleep).    Yes Historical Provider, MD  gabapentin (NEURONTIN) 600 MG tablet Take 600 mg by mouth 4 (four) times daily.    Yes Historical Provider, MD  levETIRAcetam (KEPPRA) 500 MG tablet Take 500 mg by mouth 2 (two) times daily.   Yes Historical Provider, MD  omeprazole (PRILOSEC) 40 MG capsule Take 40 mg by mouth daily. 10/20/16  Yes Historical Provider, MD  oxyCODONE-acetaminophen (PERCOCET) 5-325 MG tablet Take 1 tablet by mouth every 4 (four) hours as needed for severe pain. 11/10/16  Yes Mancel Bale, MD  polyethylene glycol (MIRALAX / GLYCOLAX) packet Take 17 g by mouth daily. 11/22/16  Yes Rodolph Bong, MD  promethazine (PHENERGAN) 25 MG tablet Take 1 tablet (25 mg total) by mouth every 6 (six) hours as needed (headache). 11/15/16  Yes Gilda Crease, MD  senna-docusate (SENOKOT-S) 8.6-50 MG tablet Take 1 tablet by mouth 2 (two) times daily. 11/21/16  Yes Rodolph Bong, MD  sertraline (ZOLOFT) 50 MG tablet Take 50 mg by mouth daily.    Yes Historical Provider, MD  topiramate (TOPAMAX) 50 MG tablet Take 50 mg by mouth 2 (two) times daily. 02/18/16  Yes Historical Provider, MD  triamterene-hydrochlorothiazide (DYAZIDE) 37.5-25 MG capsule  11/06/16  Yes Historical Provider, MD  verapamil (CALAN) 80 MG tablet Take 0.5 tablets (40 mg total) by mouth 2 (two) times daily. 02/17/16  Yes Vassie Loll, MD  warfarin (COUMADIN) 5 MG tablet Take 5 mg by mouth daily. 09/29/16  Yes Historical Provider, MD  oxyCODONE-acetaminophen (PERCOCET/ROXICET) 5-325 MG tablet Take 1-2 tablets by mouth every 6 (six) hours as needed. 11/28/16   Tannie Koskela Neva Seat, PA-C  predniSONE (DELTASONE) 10 MG tablet Take 2 tablets (20 mg total) by mouth daily. 11/28/16    Selassie Spatafore Neva Seat, PA-C  Sofosbuvir-Velpatasvir (EPCLUSA) 400-100 MG TABS Take 1 tablet by mouth daily. Patient not taking: Reported on 11/28/2016 09/11/16   Judyann Munson, MD    Family History Family History  Problem Relation Age of Onset  . Cancer Father   . Diabetes Mother     Social History Social History  Substance Use Topics  . Smoking status: Current Every Day Smoker    Packs/day: 1.00    Years: 38.00    Types: Cigarettes  . Smokeless tobacco: Never Used     Comment: cutting back  . Alcohol use 0.0 oz/week     Comment: occasional     Allergies   Patient has no known allergies.   Review of Systems Review of Systems Review of Systems All other systems negative except as documented in the HPI. All  pertinent positives and negatives as reviewed in the HPI.   Physical Exam Updated Vital Signs BP 106/70   Pulse 66   Temp 98.3 F (36.8 C)   Resp 11   Ht 5\' 9"  (1.753 m)   Wt 88.9 kg   SpO2 97%   BMI 28.94 kg/m   Physical Exam  Constitutional: He appears well-developed and well-nourished. No distress.  HENT:  Head: Normocephalic and atraumatic.  Right Ear: Tympanic membrane and ear canal normal.  Left Ear: Tympanic membrane and ear canal normal.  Nose: Nose normal.  Mouth/Throat: Uvula is midline, oropharynx is clear and moist and mucous membranes are normal.  Eyes: Pupils are equal, round, and reactive to light.  Neck: Normal range of motion. Neck supple.  Cardiovascular: Normal rate and regular rhythm.   Pulmonary/Chest: Effort normal.  Abdominal: Soft.  No signs of abdominal distention  Musculoskeletal:  No LE swelling  Neurological: He is alert.  Acting at baseline Atrophy of lower extremities.  Skin: Skin is warm and dry. No rash noted.  Nursing note and vitals reviewed.  ED Treatments / Results  Labs (all labs ordered are listed, but only abnormal results are displayed) Labs Reviewed  CBC WITH DIFFERENTIAL/PLATELET - Abnormal; Notable for the  following:       Result Value   Hemoglobin 12.4 (*)    HCT 38.5 (*)    RDW 16.3 (*)    All other components within normal limits  BASIC METABOLIC PANEL - Abnormal; Notable for the following:    Glucose, Bld 106 (*)    All other components within normal limits  VALPROIC ACID LEVEL - Abnormal; Notable for the following:    Valproic Acid Lvl <10 (*)    All other components within normal limits    EKG  EKG Interpretation None       Radiology No results found.  Procedures Procedures (including critical care time)  Medications Ordered in ED Medications  oxyCODONE-acetaminophen (PERCOCET/ROXICET) 5-325 MG per tablet 1 tablet (not administered)  ondansetron (ZOFRAN-ODT) disintegrating tablet 4 mg (4 mg Oral Given 11/28/16 1140)  oxyCODONE-acetaminophen (PERCOCET/ROXICET) 5-325 MG per tablet 2 tablet (2 tablets Oral Given 11/28/16 1402)  predniSONE (DELTASONE) tablet 60 mg (60 mg Oral Given 11/28/16 1541)  valproate (DEPACON) 1,000 mg in dextrose 5 % 50 mL IVPB (0 mg Intravenous Stopped 11/28/16 1824)  magnesium sulfate IVPB 2 g 50 mL (0 g Intravenous Stopped 11/28/16 1700)  metoCLOPramide (REGLAN) injection 10 mg (10 mg Intravenous Given 11/28/16 1542)  SUMAtriptan (IMITREX) injection 6 mg (6 mg Subcutaneous Given 11/28/16 1829)     Initial Impression / Assessment and Plan / ED Course  I have reviewed the triage vital signs and the nursing notes.  Pertinent labs & imaging results that were available during my care of the patient were reviewed by me and considered in my medical decision making (see chart for details).     Patients pain treated in the ED. Unfortunately, despite multiple therapies I was unable to improve his headache very much. I discussed case with DR. Benedict Needy, neurohospitalist, regarding the patients headache and he made some medication recommendations- Triptan and steroids. These were given. There is no indication for admission, pt has high flow oxygen tanks at home-  Anola Gurney, RN with Case Management has seen patient to ensure he can get all he needs from [facility].  Pt will be discharged home with pain medication and steroids. He is agreeable for home but would prefer to be admitted,  neurology does not feel like admission is warranted for cluster headache. Pt is well and nontoxic appearing.  Blood pressure 106/70, pulse 66, temperature 98.3 F (36.8 C), resp. rate 11, height 5\' 9"  (1.753 m), weight 88.9 kg, SpO2 97 %.   Final Clinical Impressions(s) / ED Diagnoses   Final diagnoses:  Intractable episodic cluster headache    New Prescriptions New Prescriptions   OXYCODONE-ACETAMINOPHEN (PERCOCET/ROXICET) 5-325 MG TABLET    Take 1-2 tablets by mouth every 6 (six) hours as needed.   PREDNISONE (DELTASONE) 10 MG TABLET    Take 2 tablets (20 mg total) by mouth daily.     Marlon Pel, PA-C 11/28/16 1923    Rolland Porter, MD 12/11/16 [phone]

## 2016-11-29 ENCOUNTER — Encounter (HOSPITAL_COMMUNITY): Payer: Self-pay | Admitting: Emergency Medicine

## 2016-11-29 ENCOUNTER — Emergency Department (HOSPITAL_COMMUNITY)
Admission: EM | Admit: 2016-11-29 | Discharge: 2016-11-29 | Disposition: A | Discharge status: Home / Self Care | Payer: Medicare HMO | Attending: Emergency Medicine | Admitting: Emergency Medicine

## 2016-11-29 DIAGNOSIS — G44021 Chronic cluster headache, intractable: Secondary | ICD-10-CM | POA: Diagnosis not present

## 2016-11-29 DIAGNOSIS — G43801 Other migraine, not intractable, with status migrainosus: Secondary | ICD-10-CM

## 2016-11-29 MED ORDER — MORPHINE SULFATE (PF) 4 MG/ML IV SOLN
4.0000 mg | Freq: Once | INTRAVENOUS | Status: AC
  Administered 2016-11-29: 4 mg via INTRAVENOUS
  Filled 2016-11-29: qty 1

## 2016-11-29 MED ORDER — SODIUM CHLORIDE 0.9 % IV BOLUS (SEPSIS)
1000.0000 mL | Freq: Once | INTRAVENOUS | Status: AC
  Administered 2016-11-29: 1000 mL via INTRAVENOUS

## 2016-11-29 MED ORDER — DIPHENHYDRAMINE HCL 50 MG/ML IJ SOLN
25.0000 mg | Freq: Once | INTRAMUSCULAR | Status: AC
  Administered 2016-11-29: 25 mg via INTRAVENOUS
  Filled 2016-11-29: qty 1

## 2016-11-29 MED ORDER — PROCHLORPERAZINE EDISYLATE 5 MG/ML IJ SOLN
10.0000 mg | Freq: Once | INTRAMUSCULAR | Status: AC
  Administered 2016-11-29: 10 mg via INTRAVENOUS
  Filled 2016-11-29: qty 2

## 2016-11-29 MED ORDER — SUMATRIPTAN SUCCINATE 6 MG/0.5ML ~~LOC~~ SOLN
6.0000 mg | Freq: Once | SUBCUTANEOUS | Status: AC
  Administered 2016-11-29: 6 mg via SUBCUTANEOUS
  Filled 2016-11-29: qty 0.5

## 2016-11-29 MED ORDER — HEPARIN SOD (PORK) LOCK FLUSH 100 UNIT/ML IV SOLN
500.0000 [IU] | INTRAVENOUS | Status: AC | PRN
  Administered 2016-11-29: 500 [IU]

## 2016-11-29 MED ORDER — SUMATRIPTAN SUCCINATE 6 MG/0.5ML ~~LOC~~ SOLN: 6.0000 mg | Freq: Two times a day (BID) | SUBCUTANEOUS | 0 refills | Status: DC | PRN

## 2016-11-29 NOTE — Consult Note (Signed)
Reason for Consult: Cluster headache Referring Physician: Dr. Jeannetta Nap [name] is an 56 y.o. male.  HPI: History of cluster headaches since 2005.  He was recently admitted 1/5-1/10/2017 with intractable cluster headaches and it eventually responded to oxygen.  He was fine for a week until he developed a cluster headache yesterday and it has not responded to oxygen 5 L/min for several hours at home.  He also has Percocet at home which did not work.  He is on Verapamil prophylaxis but 40 mg bid only.  He has never been on triptan injection and he denies any contraindications to it.  The jheadache is a right retroorbital and periorbital sharp shooting pains of 10/10 severity associated with conjunctival injection, ptosis, and tearing.    He also has MS and is on Tysabri since 2005.  No new exacerbations.    Past Medical History:  Diagnosis Date  . Abdominal pain, unspecified site   . Anxiety   . Arthritis   . Benign paroxysmal positional vertigo   . Chronic back pain   . Chronic pain syndrome 01/25/2008  . Cluster headache   . Depression    takes Zoloft daily  . DVT (deep venous thrombosis) (HCC)    in the left arm '09  . Gait abnormality    "uses mobile wheelchair, but is ambulatory"  . Gallstones 02/17/2009   resolved after gallbladder surgery.  [name] GERD (gastroesophageal reflux disease)    takes Omeprazole as needed  . Headache(784.0)    cluster headaches frequently-takes Topamax daily  . History of colonoscopy   . HTN (hypertension)    takes Lisinopril,Verapamil,and Triamterene HCTZ daily  . Insomnia 11/06/2008  . Joint pain   . Joint swelling    03-07-16 "swelling of right wrist" "after a fall-xray done 03-06-16 "no fractures".  . Memory loss    no an issue at present 03-07-16  . Multiple sclerosis ([place])    Dx. 2005 - Dr. Dellis Filbert follows [place] 4'17 tx. Tysabri monthly IV-[facility] , [place].  [name] Nonspecific elevation of levels of transaminase or lactic acid  dehydrogenase (LDH)   . Other specified visual disturbances   . Other syndromes affecting cervical region   . Pneumonia 2009  . Trigeminal neuralgia     history" Multiple sclerosis"    Past Surgical History:  Procedure Laterality Date  . CHOLECYSTECTOMY  02/20/2009  . COLONOSCOPY WITH PROPOFOL N/A 03/17/2016   Procedure: COLONOSCOPY WITH PROPOFOL;  Surgeon: Laurence Spates, MD;  Location: [facility] ENDOSCOPY;  Service: Endoscopy;  Laterality: N/A;  . PORT A CATH REVISION N/A 07/06/2015   Procedure: Removal and replacement of PORT A CATH;  Surgeon: Fanny Skates, MD;  Location: [place];  Service: General;  Laterality: N/A;  . PORTACATH PLACEMENT N/A 03/27/2014   Procedure: INSERTION PORT-A-CATH;  Surgeon: Adin Hector, MD;  Location: [place];  Service: General;  Laterality: N/A;    Family History  Problem Relation Age of Onset  . Cancer Father   . Diabetes Mother     Social History:  reports that he has been smoking Cigarettes.  He has a 38.00 pack-year smoking history. He has never used smokeless tobacco. He reports that he drinks alcohol. He reports that he does not use drugs.  Allergies: No Known Allergies  Prior to Admission medications   Medication Sig Start Date End Date Taking? Authorizing Provider  omeprazole (PRILOSEC) 40 MG capsule Take 40 mg by mouth daily. 10/20/16  Yes Historical Provider, MD  topiramate (TOPAMAX) 50  MG tablet Take 50 mg by mouth 2 (two) times daily. 02/18/16  Yes Historical Provider, MD  baclofen (LIORESAL) 20 MG tablet Take 20 mg by mouth 4 (four) times daily as needed for muscle spasms.     Historical Provider, MD  dalfampridine (AMPYRA) 10 MG TB12 Take 10 mg by mouth 2 (two) times daily.    Historical Provider, MD  Eszopiclone (ESZOPICLONE) 3 MG TABS Take 3 mg by mouth at bedtime as needed (for sleep).     Historical Provider, MD  gabapentin (NEURONTIN) 600 MG tablet Take 600 mg by mouth 4 (four) times daily.     Historical Provider, MD  levETIRAcetam (KEPPRA)  500 MG tablet Take 500 mg by mouth 2 (two) times daily.    Historical Provider, MD  oxyCODONE-acetaminophen (PERCOCET) 5-325 MG tablet Take 1 tablet by mouth every 4 (four) hours as needed for severe pain. 11/10/16   Daleen Bo, MD  oxyCODONE-acetaminophen (PERCOCET/ROXICET) 5-325 MG tablet Take 1-2 tablets by mouth every 6 (six) hours as needed. 11/28/16   Tiffany Carlota Raspberry, PA-C  polyethylene glycol (MIRALAX / GLYCOLAX) packet Take 17 g by mouth daily. 11/22/16   Eugenie Filler, MD  predniSONE (DELTASONE) 10 MG tablet Take 2 tablets (20 mg total) by mouth daily. 11/28/16   Tiffany Carlota Raspberry, PA-C  promethazine (PHENERGAN) 25 MG tablet Take 1 tablet (25 mg total) by mouth every 6 (six) hours as needed (headache). 11/15/16   Orpah Greek, MD  senna-docusate (SENOKOT-S) 8.6-50 MG tablet Take 1 tablet by mouth 2 (two) times daily. 11/21/16   Eugenie Filler, MD  sertraline (ZOLOFT) 50 MG tablet Take 50 mg by mouth daily.     Historical Provider, MD  Sofosbuvir-Velpatasvir (EPCLUSA) 400-100 MG TABS Take 1 tablet by mouth daily. Patient not taking: Reported on 11/28/2016 09/11/16   Carlyle Basques, MD  triamterene-hydrochlorothiazide [facility]) 37.5-25 MG capsule  11/06/16   Historical Provider, MD  verapamil (CALAN) 80 MG tablet Take 0.5 tablets (40 mg total) by mouth 2 (two) times daily. 02/17/16   Barton Dubois, MD  warfarin (COUMADIN) 5 MG tablet Take 5 mg by mouth daily. 09/29/16   Historical Provider, MD    Medications: Prior to Admission:  (Not in a hospital admission)  Results for orders placed or performed during the hospital encounter of 11/28/16 (from the past 48 hour(s))  CBC with Differential/Platelet     Status: Abnormal   Collection Time: 11/28/16  5:06 PM  Result Value Ref Range   WBC 4.8 4.0 - 10.5 K/uL   RBC 4.69 4.22 - 5.81 MIL/uL   Hemoglobin 12.4 (L) 13.0 - 17.0 g/dL   HCT 38.5 (L) 39.0 - 52.0 %   MCV 82.1 78.0 - 100.0 fL   MCH 26.4 26.0 - 34.0 pg   MCHC 32.2 30.0 - 36.0  g/dL   RDW 16.3 (H) 11.5 - 15.5 %   Platelets 254 150 - 400 K/uL   Neutrophils Relative % 46 %   Neutro Abs 2.2 1.7 - 7.7 K/uL   Lymphocytes Relative 46 %   Lymphs Abs 2.2 0.7 - 4.0 K/uL   Monocytes Relative 5 %   Monocytes Absolute 0.3 0.1 - 1.0 K/uL   Eosinophils Relative 3 %   Eosinophils Absolute 0.1 0.0 - 0.7 K/uL   Basophils Relative 0 %   Basophils Absolute 0.0 0.0 - 0.1 K/uL  Basic metabolic panel     Status: Abnormal   Collection Time: 11/28/16  5:06 PM  Result Value Ref Range  Sodium 140 135 - 145 mmol/L   Potassium 4.5 3.5 - 5.1 mmol/L   Chloride 108 101 - 111 mmol/L   CO2 25 22 - 32 mmol/L   Glucose, Bld 106 (H) 65 - 99 mg/dL   BUN 9 6 - 20 mg/dL   Creatinine, Ser 0.79 0.61 - 1.24 mg/dL   Calcium 9.0 8.9 - 10.3 mg/dL   GFR calc non Af Amer >60 >60 mL/min   GFR calc Af Amer >60 >60 mL/min    Comment: (NOTE) The eGFR has been calculated using the CKD EPI equation. This calculation has not been validated in all clinical situations. eGFR's persistently <60 mL/min signify possible Chronic Kidney Disease.    Anion gap 7 5 - 15  Valproic acid level     Status: Abnormal   Collection Time: 11/28/16  5:06 PM  Result Value Ref Range   Valproic Acid Lvl <10 (L) 50.0 - 100.0 ug/mL    Comment: RESULTS CONFIRMED BY MANUAL DILUTION    No results found.  ROS Blood pressure 136/71, pulse 70, temperature 98.3 F (36.8 C), temperature source Oral, resp. rate 16, height '5\' 9"'$  (1.753 m), weight 88.5 kg (195 lb), SpO2 95 %.   Neurologic Examination: Agitated and yelling for help.  He has right eye ptosis, redness in the right eye, tears coming from right eye.  EOMI, PERL.   Face symmetric.  Tongue midline.  Strength 5/5 bilaterally.  Coord- intact.  Assessment/Plan:  Exacerbation of cluster headache not responsive to oxygen.    I will give him a Sumatriptan injection 6 mg SQ now and if needed another in 1 hour.    I recommend increasing his preventative dose of Verapamil  and change to ER 120 mg qd when discharged.  Rogue Jury, MD 11/29/2016, 3:00 PM

## 2016-11-29 NOTE — ED Notes (Signed)
Dr Tegeler reports that pt will be an ED to ED transfer so that neuro can evaluate him at [facility]. [facility] Charge RN notified of transfer

## 2016-11-29 NOTE — ED Provider Notes (Signed)
Arrived. Transfer for status migraine and neuro consultation from [facility]. Stable. Contacted neuro for consultation   Azalia Bilis, MD 11/29/16 1435

## 2016-11-29 NOTE — ED Provider Notes (Signed)
4:12 PM Seen by neurology.  He feels much better after subcutaneous Imitrex here in the emergency department.  Patient be sent home with 6 doses per neurology.  Neurology is requested that the patient double his verapamil dose   Azalia Bilis, MD 11/29/16 [phone]

## 2016-11-29 NOTE — ED Notes (Addendum)
Pt continues to yell out and ring call bell multiple times. Pt advised that Dr Rush Landmark is aware that he is here and has signed up and will be with him soon. Chaplain paged for pt per his request.

## 2016-11-29 NOTE — ED Provider Notes (Signed)
[facility]-EMERGENCY DEPT Provider Note   CSN: [id number] Arrival date & time: 11/29/16  7829     History   Chief Complaint Chief Complaint  Patient presents with  . Migraine    HPI [name] is a 56 y.o. male with a past medical history significant for severe chronic cluster headaches, vertigo, DVT on Coumadin, and MS leaving him bedbound who presents with continued worsening headaches. Patient reports that he has been having a return of headaches for the last week and a half. He says that he has been to the emergency department several times including last night at [facility]. Patient says that during that visit, his headache was unfortunately not controlled prior to his discharge. They told him that the neurology team did not feel he needed admission at that time however, patient reports he was told by his PCP, Dr. Tinnie Gens, that he needed admission. Patient denies any other symptoms including no fevers, chills, chest, shortness of breath, nausea, vomiting, constipation, diarrhea, dysuria.   Chart review shows that during his visit yesterday, he received Zofran, oxycodone, prednisone, Reglan, magnesium, Depakote, Imitrex, oxygen, and fluids.  Patient reports minimal relief in his symptoms.   He says that he has continued to use the oxygen at home and his headache is not under control. He describes it as a 10 out of 10, located in his right head, and radiates to the back. He describes it as a machete-like stabbing as well as a sledgehammer in the back of the head.    HPI  Past Medical History:  Diagnosis Date  . Abdominal pain, unspecified site   . Anxiety   . Arthritis   . Benign paroxysmal positional vertigo   . Chronic back pain   . Chronic pain syndrome 01/25/2008  . Cluster headache   . Depression    takes Zoloft daily  . DVT (deep venous thrombosis) (HCC)    in the left arm '09  . Gait abnormality    "uses mobile wheelchair, but is ambulatory"  . Gallstones 02/17/2009     resolved after gallbladder surgery.  [name] GERD (gastroesophageal reflux disease)    takes Omeprazole as needed  . Headache(784.0)    cluster headaches frequently-takes Topamax daily  . History of colonoscopy   . HTN (hypertension)    takes Lisinopril,Verapamil,and Triamterene HCTZ daily  . Insomnia 11/06/2008  . Joint pain   . Joint swelling    03-07-16 "swelling of right wrist" "after a fall-xray done 03-06-16 "no fractures".  . Memory loss    no an issue at present 03-07-16  . Multiple sclerosis (HCC)    Dx. 2005 - Dr. Tinnie Gens follows LOV 4'17 tx. Tysabri monthly IV-[facility] [place].  [name] Nonspecific elevation of levels of transaminase or lactic acid dehydrogenase (LDH)   . Other specified visual disturbances   . Other syndromes affecting cervical region   . Pneumonia 2009  . Trigeminal neuralgia     history" Multiple sclerosis"    Patient Active Problem List   Diagnosis Date Noted  . AKI (acute kidney injury) (HCC) 11/15/2016  . Chronic hepatitis C virus infection (HCC) 11/15/2016  . Chronic abdominal pain 11/15/2016  . Chronic pain syndrome 11/15/2016  . Acute retention of urine 11/15/2016  . Generalized abdominal pain   . Chronic cluster headache, not intractable   . Community acquired pneumonia   . TB lung, latent   . HCAP (healthcare-associated pneumonia) 02/16/2016  . Hypokalemia 02/16/2016  . Cluster headache   .  DVT (deep venous thrombosis) (HCC)   . Depression   . GERD (gastroesophageal reflux disease)   . HTN (hypertension)   . Essential hypertension   . Gastroesophageal reflux disease without esophagitis   . CAP (community acquired pneumonia)   . Multiple sclerosis (HCC) 08/02/2013  . ABDOMINAL BLOATING 10/14/2010  . LOOSE STOOLS 10/14/2010  . PULMONARY EMBOLISM, HX OF 10/14/2010    Past Surgical History:  Procedure Laterality Date  . CHOLECYSTECTOMY  02/20/2009  . COLONOSCOPY WITH PROPOFOL N/A 03/17/2016   Procedure: COLONOSCOPY WITH  PROPOFOL;  Surgeon: [name], MD;  Location: [facility] ENDOSCOPY;  Service: Endoscopy;  Laterality: N/A;  . PORT A CATH REVISION N/A 07/06/2015   Procedure: Removal and replacement of PORT A CATH;  Surgeon: [name], MD;  Location: [facility] OR;  Service: General;  Laterality: N/A;  . PORTACATH PLACEMENT N/A 03/27/2014   Procedure: INSERTION PORT-A-CATH;  Surgeon: [name], MD;  Location: [facility] OR;  Service: General;  Laterality: N/A;       Home Medications    Prior to Admission medications   Medication Sig Start Date End Date Taking? Authorizing Provider  omeprazole (PRILOSEC) 40 MG capsule Take 40 mg by mouth daily. 10/20/16  Yes Historical Provider, MD  topiramate (TOPAMAX) 50 MG tablet Take 50 mg by mouth 2 (two) times daily. 02/18/16  Yes Historical Provider, MD  baclofen (LIORESAL) 20 MG tablet Take 20 mg by mouth 4 (four) times daily as needed for muscle spasms.     Historical Provider, MD  dalfampridine (AMPYRA) 10 MG TB12 Take 10 mg by mouth 2 (two) times daily.    Historical Provider, MD  Eszopiclone (ESZOPICLONE) 3 MG TABS Take 3 mg by mouth at bedtime as needed (for sleep).     Historical Provider, MD  gabapentin (NEURONTIN) 600 MG tablet Take 600 mg by mouth 4 (four) times daily.     Historical Provider, MD  levETIRAcetam (KEPPRA) 500 MG tablet Take 500 mg by mouth 2 (two) times daily.    Historical Provider, MD  oxyCODONE-acetaminophen (PERCOCET) 5-325 MG tablet Take 1 tablet by mouth every 4 (four) hours as needed for severe pain. 11/10/16   Mancel Bale, MD  oxyCODONE-acetaminophen (PERCOCET/ROXICET) 5-325 MG tablet Take 1-2 tablets by mouth every 6 (six) hours as needed. 11/28/16   Tiffany Neva Seat, PA-C  polyethylene glycol (MIRALAX / GLYCOLAX) packet Take 17 g by mouth daily. 11/22/16   Rodolph Bong, MD  predniSONE (DELTASONE) 10 MG tablet Take 2 tablets (20 mg total) by mouth daily. 11/28/16   Tiffany Neva Seat, PA-C  promethazine (PHENERGAN) 25 MG tablet Take 1 tablet (25 mg  total) by mouth every 6 (six) hours as needed (headache). 11/15/16   Gilda Crease, MD  senna-docusate (SENOKOT-S) 8.6-50 MG tablet Take 1 tablet by mouth 2 (two) times daily. 11/21/16   Rodolph Bong, MD  sertraline (ZOLOFT) 50 MG tablet Take 50 mg by mouth daily.     Historical Provider, MD  Sofosbuvir-Velpatasvir (EPCLUSA) 400-100 MG TABS Take 1 tablet by mouth daily. Patient not taking: Reported on 11/28/2016 09/11/16   Judyann Munson, MD  triamterene-hydrochlorothiazide [facility]) 37.5-25 MG capsule  11/06/16   Historical Provider, MD  verapamil (CALAN) 80 MG tablet Take 0.5 tablets (40 mg total) by mouth 2 (two) times daily. 02/17/16   Vassie Loll, MD  warfarin (COUMADIN) 5 MG tablet Take 5 mg by mouth daily. 09/29/16   Historical Provider, MD    Family History Family History  Problem Relation Age of  Onset  . Cancer Father   . Diabetes Mother     Social History Social History  Substance Use Topics  . Smoking status: Current Every Day Smoker    Packs/day: 1.00    Years: 38.00    Types: Cigarettes  . Smokeless tobacco: Never Used     Comment: cutting back  . Alcohol use 0.0 oz/week     Comment: occasional     Allergies   Patient has no known allergies.   Review of Systems Review of Systems  Constitutional: Negative for activity change, chills, diaphoresis, fatigue and fever.  HENT: Negative for congestion and rhinorrhea.   Eyes: Positive for photophobia. Negative for visual disturbance.  Respiratory: Negative for cough, chest tightness, shortness of breath, wheezing and stridor.   Cardiovascular: Negative for chest pain, palpitations and leg swelling.  Gastrointestinal: Negative for abdominal distention, abdominal pain, blood in stool, constipation, diarrhea, nausea and vomiting.  Genitourinary: Negative for difficulty urinating, dysuria and flank pain.  Musculoskeletal: Negative for back pain and gait problem.  Skin: Negative for rash and wound.  Neurological:  Positive for headaches. Negative for dizziness, tremors, syncope, weakness, light-headedness and numbness.  Psychiatric/Behavioral: Negative for agitation.  All other systems reviewed and are negative.    Physical Exam Updated Vital Signs BP 137/86 (BP Location: Left Arm)   Pulse 83   Temp 98.3 F (36.8 C) (Oral)   Resp 18   Ht 5\' 9"  (1.753 m)   Wt 195 lb (88.5 kg)   SpO2 93%   BMI 28.80 kg/m   Physical Exam  Constitutional: He is oriented to person, place, and time. He appears well-developed and well-nourished.  HENT:  Head: Normocephalic and atraumatic.    Mouth/Throat: Oropharynx is clear and moist. No oropharyngeal exudate.  Eyes: Conjunctivae and EOM are normal. Pupils are equal, round, and reactive to light.  Neck: Normal range of motion. Neck supple.  Cardiovascular: Normal rate and regular rhythm.   No murmur heard. Pulmonary/Chest: Effort normal and breath sounds normal. No respiratory distress.  Abdominal: Soft. There is no tenderness.  Musculoskeletal: He exhibits tenderness. He exhibits no edema.  Neurological: He is alert and oriented to person, place, and time. No cranial nerve deficit. He exhibits abnormal muscle tone (Weakness in legs at his baseline). Coordination normal.  Skin: Skin is warm and dry. Capillary refill takes less than 2 seconds. No erythema.  Psychiatric: He has a normal mood and affect.  Nursing note and vitals reviewed.    ED Treatments / Results  Labs (all labs ordered are listed, but only abnormal results are displayed) Labs Reviewed - No data to display  EKG  EKG Interpretation None       Radiology No results found.  Procedures Procedures (including critical care time)  Medications Ordered in ED Medications  sodium chloride 0.9 % bolus 1,000 mL (1,000 mLs Intravenous New Bag/Given 11/29/16 1610)  prochlorperazine (COMPAZINE) injection 10 mg (10 mg Intravenous Given 11/29/16 0808)  diphenhydrAMINE (BENADRYL) injection 25  mg (25 mg Intravenous Given 11/29/16 0808)  morphine 4 MG/ML injection 4 mg (4 mg Intravenous Given 11/29/16 0808)     Initial Impression / Assessment and Plan / ED Course  I have reviewed the triage vital signs and the nursing notes.  Pertinent labs & imaging results that were available during my care of the patient were reviewed by me and considered in my medical decision making (see chart for details).     [name] is a 56 y.o. male with  a past medical history significant for severe chronic cluster headaches, vertigo, DVT on Coumadin, and MS leaving him bedbound who presents with continued worsening headaches.  History and exam are seen above. On exam, patient has tenderness on his right temple. Patient has weakness in his right lower extremity which she reports is at his baseline. Patient has normal sensation in all extremities. Patient has normal finger-nose-finger, no facial droop, and his pupils are reactive bilaterally. Patient has noticed neck stiffness or neck tenderness. Lungs are clear and abdomen is nontender.  Given the extensive medications given for his headache yesterday without relief, patient will have a different combination of headache cocktail attempted today. Patient will be given Compazine, Benadryl, and fluids. If this doesn't help, anticipate speaking with neurology for further headache management recommendations.   Patient reported minimal pain relief.  Neurology called and they recommended transfer to [facility] for evaluation. They report that they will evaluate the patient and determine disposition and management strategies.  Patient agreed with plan of transfer of care. Care accepted by Dr. Patria Mane at [facility],. Patient transferred in stable condition.   Final Clinical Impressions(s) / ED Diagnoses   Final diagnoses:  Other migraine with status migrainosus, not intractable    Clinical Impression: 1. Other migraine with status migrainosus, not  intractable     Disposition: Transferred to [facility] for neurology evaluation    Heide Scales, MD 11/29/16 [phone]

## 2016-11-29 NOTE — ED Notes (Signed)
Pt arrives via carelink from [place] for c/o frontal headache that he was seen here last night for. Per carelink patient was medicated at [facility] but pain continued, MD at [facility] wanted patient sent here for Neuro to evaluate him. Pt agitated upon arrival to ED stating that the computers and blanket warmer electricity are making his headache worse. MD campos made aware that patient has arrived.

## 2016-11-29 NOTE — ED Triage Notes (Signed)
Pt reports recurrent headache tonight was seen at [facility] and d/c to home and headache again and came to [facility] fpr care. Pt states this is the worse his headaches have been and states if it doen't improve he would rather be dead/

## 2016-11-29 NOTE — Progress Notes (Signed)
Pt was sitting up in bed awake but appearing drowsy when [facility] arrived. He was thankful for visit saying his MS pain and cluster headaches was making him feel down. He said he would never do it, but he thought what if he got in his wheel chair and crossed 40, saying that's how bad the pain was. He said he would never do it but it crossed his mind. Pt said his 56 year old mother lives with him. Pt said he wanted a visit for prayer for God to give him godly thoughts whenever other thoughts would arise. He also asked prayer for a godly wife. Pt said he is currently divorced. He also asked for prayer for his mother and himself. CH had prayer w/pt for which he was appreciative. Pls page if additional support is needed. Chaplain Marjory Lies, M.Div.   11/29/16 0900  Clinical Encounter Type  Visited With Patient

## 2016-11-29 NOTE — Discharge Instructions (Signed)
Please call your neurologist for follow up °

## 2016-11-29 NOTE — ED Notes (Signed)
[facility] contacted for tx home  

## 2016-11-29 NOTE — ED Notes (Signed)
Chaplain at bedside per pt request °

## 2016-11-30 ENCOUNTER — Emergency Department (HOSPITAL_COMMUNITY)
Admission: EM | Admit: 2016-11-30 | Discharge: 2016-11-30 | Disposition: A | Discharge status: Home / Self Care | Payer: Medicare HMO | Attending: Emergency Medicine | Admitting: Emergency Medicine

## 2016-11-30 ENCOUNTER — Encounter (HOSPITAL_COMMUNITY): Payer: Self-pay | Admitting: Emergency Medicine

## 2016-11-30 DIAGNOSIS — G44009 Cluster headache syndrome, unspecified, not intractable: Secondary | ICD-10-CM | POA: Diagnosis not present

## 2016-11-30 DIAGNOSIS — I10 Essential (primary) hypertension: Secondary | ICD-10-CM | POA: Diagnosis not present

## 2016-11-30 DIAGNOSIS — Z79899 Other long term (current) drug therapy: Secondary | ICD-10-CM | POA: Insufficient documentation

## 2016-11-30 DIAGNOSIS — R51 Headache: Secondary | ICD-10-CM | POA: Diagnosis present

## 2016-11-30 DIAGNOSIS — F17210 Nicotine dependence, cigarettes, uncomplicated: Secondary | ICD-10-CM | POA: Diagnosis not present

## 2016-11-30 MED ORDER — PROCHLORPERAZINE EDISYLATE 5 MG/ML IJ SOLN
5.0000 mg | Freq: Once | INTRAMUSCULAR | Status: AC
  Administered 2016-11-30: 5 mg via INTRAVENOUS
  Filled 2016-11-30: qty 2

## 2016-11-30 MED ORDER — SUMATRIPTAN SUCCINATE 6 MG/0.5ML ~~LOC~~ SOLN
6.0000 mg | Freq: Once | SUBCUTANEOUS | Status: AC
  Administered 2016-11-30: 6 mg via SUBCUTANEOUS
  Filled 2016-11-30: qty 0.5

## 2016-11-30 MED ORDER — DIPHENHYDRAMINE HCL 50 MG/ML IJ SOLN
25.0000 mg | Freq: Once | INTRAMUSCULAR | Status: AC
  Administered 2016-11-30: 25 mg via INTRAVENOUS
  Filled 2016-11-30: qty 1

## 2016-11-30 MED ORDER — SODIUM CHLORIDE 0.9 % IV BOLUS (SEPSIS)
1000.0000 mL | Freq: Once | INTRAVENOUS | Status: AC
Start: 2016-11-30 — End: 2016-11-30
  Administered 2016-11-30: 1000 mL via INTRAVENOUS

## 2016-11-30 MED ORDER — HEPARIN SOD (PORK) LOCK FLUSH 100 UNIT/ML IV SOLN
500.0000 [IU] | Freq: Once | INTRAVENOUS | Status: AC
  Administered 2016-11-30: 500 [IU]
  Filled 2016-11-30: qty 5

## 2016-11-30 MED ORDER — VERAPAMIL HCL ER 120 MG PO TBCR
120.0000 mg | EXTENDED_RELEASE_TABLET | Freq: Every day | ORAL | 0 refills | Status: DC
Start: 2016-11-30 — End: 2017-07-24

## 2016-11-30 NOTE — Progress Notes (Signed)
CSW spoke with patient's brother [name]), patient was asleep. Patient's brother reports that patient has not been able to sleep in days. Patient's brother reports that patient is no longer able to care for himself due to medical condition and that he needs a facility that is able to care for him.   CSW provided patient's brother with local SNF resources and advised patient's brother to follow up with patient's PCP for assistance with facilitating placement for patient.  Patient's brother thanked CSW for information.

## 2016-11-30 NOTE — ED Provider Notes (Signed)
[facility]-EMERGENCY DEPT Provider Note   CSN: [id number] Arrival date & time: 11/30/16  1250  By signing my name below, I, Orpah Cobb, attest that this documentation has been prepared under the direction and in the presence of Buel Ream, PA-C. Electronically Signed: Orpah Cobb , ED Scribe. 11/24/16. 4:20 PM.   History   Chief Complaint Chief Complaint  Patient presents with  . Migraine    HPI  Joshuah Minella is a 56 y.o. male with hx of Multiple Sclerosis who presents to the Emergency Department complaining of constant, moderate to severe headache with sudden onset x1 day. Pt was seen here yesterday with the same complaint and prescribed Imitrex that he states improved his headache at the time. Today, pt states that cluster headaches have returned but are worse now. Pt reports nausea, neck pain, vision changes and decreased sleep. Pt has taken the prescribed Imitrex by mouth as well as Verapamil and Prednisone with no relief. He states that eye movement and sleeping exacerbate the headache. Pt denies numbness/tingling, chest pain, SOB, abdominal pain and vomiting. Of note, pt is on 2L O2 at home.   The history is provided by the patient. No language interpreter was used.    Past Medical History:  Diagnosis Date  . Abdominal pain, unspecified site   . Anxiety   . Arthritis   . Benign paroxysmal positional vertigo   . Chronic back pain   . Chronic pain syndrome 01/25/2008  . Cluster headache   . Depression    takes Zoloft daily  . DVT (deep venous thrombosis) (HCC)    in the left arm '09  . Gait abnormality    "uses mobile wheelchair, but is ambulatory"  . Gallstones 02/17/2009   resolved after gallbladder surgery.  [name] GERD (gastroesophageal reflux disease)    takes Omeprazole as needed  . Headache(784.0)    cluster headaches frequently-takes Topamax daily  . History of colonoscopy   . HTN (hypertension)    takes Lisinopril,Verapamil,and Triamterene HCTZ daily    . Insomnia 11/06/2008  . Joint pain   . Joint swelling    03-07-16 "swelling of right wrist" "after a fall-xray done 03-06-16 "no fractures".  . Memory loss    no an issue at present 03-07-16  . Multiple sclerosis (HCC)    Dx. 2005 - Dr. Tinnie Gens follows LOV 4'17 tx. Tysabri monthly IV-[facility] , [place].  [name] Nonspecific elevation of levels of transaminase or lactic acid dehydrogenase (LDH)   . Other specified visual disturbances   . Other syndromes affecting cervical region   . Pneumonia 2009  . Trigeminal neuralgia     history" Multiple sclerosis"    Patient Active Problem List   Diagnosis Date Noted  . AKI (acute kidney injury) (HCC) 11/15/2016  . Chronic hepatitis C virus infection (HCC) 11/15/2016  . Chronic abdominal pain 11/15/2016  . Chronic pain syndrome 11/15/2016  . Acute retention of urine 11/15/2016  . Generalized abdominal pain   . Chronic cluster headache, not intractable   . Community acquired pneumonia   . TB lung, latent   . HCAP (healthcare-associated pneumonia) 02/16/2016  . Hypokalemia 02/16/2016  . Cluster headache   . DVT (deep venous thrombosis) (HCC)   . Depression   . GERD (gastroesophageal reflux disease)   . HTN (hypertension)   . Essential hypertension   . Gastroesophageal reflux disease without esophagitis   . CAP (community acquired pneumonia)   . Multiple sclerosis (HCC) 08/02/2013  . ABDOMINAL BLOATING 10/14/2010  .  LOOSE STOOLS 10/14/2010  . PULMONARY EMBOLISM, HX OF 10/14/2010    Past Surgical History:  Procedure Laterality Date  . CHOLECYSTECTOMY  02/20/2009  . COLONOSCOPY WITH PROPOFOL N/A 03/17/2016   Procedure: COLONOSCOPY WITH PROPOFOL;  Surgeon: [name], MD;  Location: [facility];  Service: Endoscopy;  Laterality: N/A;  . PORT A CATH REVISION N/A 07/06/2015   Procedure: Removal and replacement of PORT A CATH;  Surgeon: [name], MD;  Location: [facility] OR;  Service: General;  Laterality: N/A;  . PORTACATH  PLACEMENT N/A 03/27/2014   Procedure: INSERTION PORT-A-CATH;  Surgeon: [name], MD;  Location: [facility] OR;  Service: General;  Laterality: N/A;       Home Medications    Prior to Admission medications   Medication Sig Start Date End Date Taking? Authorizing Provider  baclofen (LIORESAL) 20 MG tablet Take 20 mg by mouth 4 (four) times daily as needed for muscle spasms.     Historical Provider, MD  dalfampridine (AMPYRA) 10 MG TB12 Take 10 mg by mouth 2 (two) times daily.    Historical Provider, MD  Eszopiclone (ESZOPICLONE) 3 MG TABS Take 3 mg by mouth at bedtime as needed (for sleep).     Historical Provider, MD  gabapentin (NEURONTIN) 600 MG tablet Take 600 mg by mouth 4 (four) times daily.     Historical Provider, MD  levETIRAcetam (KEPPRA) 500 MG tablet Take 500 mg by mouth 2 (two) times daily.    Historical Provider, MD  omeprazole (PRILOSEC) 40 MG capsule Take 40 mg by mouth daily. 10/20/16   Historical Provider, MD  oxyCODONE-acetaminophen (PERCOCET) 5-325 MG tablet Take 1 tablet by mouth every 4 (four) hours as needed for severe pain. 11/10/16   Mancel Bale, MD  oxyCODONE-acetaminophen (PERCOCET/ROXICET) 5-325 MG tablet Take 1-2 tablets by mouth every 6 (six) hours as needed. 11/28/16   Tiffany Neva Seat, PA-C  polyethylene glycol (MIRALAX / GLYCOLAX) packet Take 17 g by mouth daily. 11/22/16   Rodolph Bong, MD  predniSONE (DELTASONE) 10 MG tablet Take 2 tablets (20 mg total) by mouth daily. 11/28/16   Tiffany Neva Seat, PA-C  promethazine (PHENERGAN) 25 MG tablet Take 1 tablet (25 mg total) by mouth every 6 (six) hours as needed (headache). 11/15/16   Gilda Crease, MD  senna-docusate (SENOKOT-S) 8.6-50 MG tablet Take 1 tablet by mouth 2 (two) times daily. 11/21/16   Rodolph Bong, MD  sertraline (ZOLOFT) 50 MG tablet Take 50 mg by mouth daily.     Historical Provider, MD  Sofosbuvir-Velpatasvir (EPCLUSA) 400-100 MG TABS Take 1 tablet by mouth daily. Patient not taking:  Reported on 11/28/2016 09/11/16   Judyann Munson, MD  SUMAtriptan (IMITREX) 6 MG/0.5ML SOLN injection Inject 0.5 mLs (6 mg total) into the skin every 12 (twelve) hours as needed for migraine or headache. May repeat in 2 hours if headache persists or recurs. 11/29/16   Azalia Bilis, MD  topiramate (TOPAMAX) 50 MG tablet Take 50 mg by mouth 2 (two) times daily. 02/18/16   Historical Provider, MD  triamterene-hydrochlorothiazide (DYAZIDE) 37.5-25 MG capsule  11/06/16   Historical Provider, MD  verapamil (CALAN-SR) 120 MG CR tablet Take 1 tablet (120 mg total) by mouth at bedtime. 11/30/16   Emi Holes, PA-C  warfarin (COUMADIN) 5 MG tablet Take 5 mg by mouth daily. 09/29/16   Historical Provider, MD    Family History Family History  Problem Relation Age of Onset  . Cancer Father   . Diabetes Mother  Social History Social History  Substance Use Topics  . Smoking status: Current Every Day Smoker    Packs/day: 1.00    Years: 38.00    Types: Cigarettes  . Smokeless tobacco: Never Used     Comment: cutting back  . Alcohol use 0.0 oz/week     Comment: occasional     Allergies   Patient has no known allergies.   Review of Systems Review of Systems  Eyes: Positive for visual disturbance.  Respiratory: Negative for shortness of breath.   Cardiovascular: Negative for chest pain.  Gastrointestinal: Positive for nausea. Negative for abdominal pain and vomiting.  Musculoskeletal: Positive for neck pain.  Neurological: Negative for numbness.  Psychiatric/Behavioral: Positive for sleep disturbance.     Physical Exam Updated Vital Signs BP 111/73   Pulse 64   Temp 98 F (36.7 C) (Oral)   Resp 18   SpO2 97%   Physical Exam  Constitutional: He appears well-developed and well-nourished. No distress.  HENT:  Head: Normocephalic and atraumatic.  Mouth/Throat: Oropharynx is clear and moist. No oropharyngeal exudate.  Eyes: Conjunctivae and EOM are normal. Pupils are equal, round,  and reactive to light. Right eye exhibits no discharge. Left eye exhibits no discharge. No scleral icterus.  Neck: Normal range of motion. Neck supple. No thyromegaly present.  Cardiovascular: Normal rate, regular rhythm, normal heart sounds and intact distal pulses.  Exam reveals no gallop and no friction rub.   No murmur heard. Pulmonary/Chest: Effort normal and breath sounds normal. No stridor. No respiratory distress. He has no wheezes. He has no rales.  Abdominal: Soft. Bowel sounds are normal. He exhibits no distension. There is no tenderness. There is no rebound and no guarding.  Musculoskeletal: He exhibits no edema.  Lymphadenopathy:    He has no cervical adenopathy.  Neurological: He is alert. Coordination normal.  CN 3-12 intact; normal sensation throughout; 5/5 strength in all 4 extremities; equal bilateral grip strength; no ataxia on finger to nose  Skin: Skin is warm and dry. No rash noted. He is not diaphoretic. No pallor.  Psychiatric: He has a normal mood and affect.  Nursing note and vitals reviewed.    ED Treatments / Results   DIAGNOSTIC STUDIES: Oxygen Saturation is 94% on 4L via nasal cannula, inadequate by my interpretation.   COORDINATION OF CARE: 8:00 PM-Discussed next steps with pt. Pt verbalized understanding and is agreeable with the plan.    Labs (all labs ordered are listed, but only abnormal results are displayed) Labs Reviewed - No data to display  EKG  EKG Interpretation None       Radiology No results found. =Procedures Procedures (including critical care time)  Medications Ordered in ED Medications  sodium chloride 0.9 % bolus 1,000 mL (0 mLs Intravenous Stopped 11/30/16 1833)  prochlorperazine (COMPAZINE) injection 5 mg (5 mg Intravenous Given 11/30/16 1455)  diphenhydrAMINE (BENADRYL) injection 25 mg (25 mg Intravenous Given 11/30/16 1455)  SUMAtriptan (IMITREX) injection 6 mg (6 mg Subcutaneous Given 11/30/16 1454)  heparin lock  flush 100 unit/mL (500 Units Intracatheter Given 11/30/16 1937)     Initial Impression / Assessment and Plan / ED Course  I have reviewed the triage vital signs and the nursing notes.  Pertinent labs & imaging results that were available during my care of the patient were reviewed by me and considered in my medical decision making (see chart for details).     Pt HA treated and improved while in ED with Compazine, Benadryl, subcutaneous Imitrex,  1 L of normal saline. Patient deeply sleeping on reevaluation.  Presentation is like pts typical HA and non concerning for [facility], ICH, Meningitis, or temporal arteritis. Pt is afebrile with no focal neuro deficits, nuchal rigidity, or change in vision. Patient was evaluated in the ED and by neurology yesterday who recommended follow-up to patient's neurologist, subcutaneous Imitrex, and change in verapamil dosage to 120 mg ER once daily. I discharged patient home with this recommendation and new prescription for verapamil. Patient to follow up with neurologist this week. Return precautions discussed. Patient understands and agrees with plan. Patient vitals stable throughout ED course and discharged in satisfactory condition. I discussed patient case with Dr. Rush Landmark who guided the patient's management and agrees with plan.    Final Clinical Impressions(s) / ED Diagnoses   Final diagnoses:  Cluster headache, not intractable, unspecified chronicity pattern    New Prescriptions Discharge Medication List as of 11/30/2016  6:01 PM    START taking these medications   Details  verapamil (CALAN-SR) 120 MG CR tablet Take 1 tablet (120 mg total) by mouth at bedtime., Starting Sun 11/30/2016, Print       I personally performed the services described in this documentation, which was scribed in my presence. The recorded information has been reviewed and is accurate.     Emi Holes, PA-C 11/30/16 2001    Canary Brim Tegeler, MD 12/01/16 1013

## 2016-11-30 NOTE — ED Triage Notes (Signed)
Per EMS patient comes from home for migraine that has been constant since yesterday.

## 2016-11-30 NOTE — Progress Notes (Deleted)
CSW spoke with patient's brother Jerilynn Som), patient was asleep. Patient's brother reports that patient has not been able to sleep in days. Patient's brother reports that patient is no longer able to care for himself due to medical condition and that he needs facility that is able to care for him.   CSW provided patient's brother with local SNF resources and advised patient's brother to follow up with patient's PCP for assistance with facilitating placement for patient.  Patient's brother thanked CSW for information.

## 2016-11-30 NOTE — ED Notes (Signed)
Family has went home for the evening. Will be at residence waiting on patient to arrive via [facility].

## 2016-11-30 NOTE — Discharge Instructions (Signed)
Medications: Imitrex, verapamil (Calan-SR)  Treatment: Take Imitrex as prescribed yesterday, every 12 hours. You may repeat dose if you find no relief after 2 hours. Change your verapamil dose as directed yesterday- stop taking your current dosage and switch to Calan-SR once daily at bedtime.  Follow-up: Please call your neurologist to be seen as soon as possible for follow-up. Please return to the emergency department if you develop any new or worsening symptoms.

## 2016-12-01 ENCOUNTER — Inpatient Hospital Stay: Payer: Medicare HMO | Attending: Oncology

## 2016-12-01 ENCOUNTER — Emergency Department (HOSPITAL_COMMUNITY): Payer: Medicare HMO

## 2016-12-01 ENCOUNTER — Encounter (HOSPITAL_COMMUNITY): Payer: Self-pay | Admitting: Oncology

## 2016-12-01 ENCOUNTER — Emergency Department (HOSPITAL_COMMUNITY)
Admission: EM | Admit: 2016-12-01 | Discharge: 2016-12-01 | Disposition: A | Discharge status: Home / Self Care | Payer: Medicare HMO | Attending: Emergency Medicine | Admitting: Emergency Medicine

## 2016-12-01 DIAGNOSIS — I10 Essential (primary) hypertension: Secondary | ICD-10-CM | POA: Insufficient documentation

## 2016-12-01 DIAGNOSIS — G44029 Chronic cluster headache, not intractable: Secondary | ICD-10-CM | POA: Diagnosis not present

## 2016-12-01 DIAGNOSIS — Z79899 Other long term (current) drug therapy: Secondary | ICD-10-CM | POA: Diagnosis not present

## 2016-12-01 DIAGNOSIS — F17210 Nicotine dependence, cigarettes, uncomplicated: Secondary | ICD-10-CM | POA: Diagnosis not present

## 2016-12-01 DIAGNOSIS — Z7901 Long term (current) use of anticoagulants: Secondary | ICD-10-CM | POA: Diagnosis not present

## 2016-12-01 DIAGNOSIS — G44009 Cluster headache syndrome, unspecified, not intractable: Secondary | ICD-10-CM | POA: Diagnosis present

## 2016-12-01 DIAGNOSIS — G35 Multiple sclerosis: Secondary | ICD-10-CM | POA: Insufficient documentation

## 2016-12-01 LAB — PROTIME-INR
INR: 2.4
Prothrombin Time: 26.6 seconds — ABNORMAL HIGH (ref 11.4–15.2)

## 2016-12-01 MED ORDER — MORPHINE SULFATE (PF) 4 MG/ML IV SOLN
6.0000 mg | Freq: Once | INTRAVENOUS | Status: AC
Start: 2016-12-01 — End: 2016-12-01
  Administered 2016-12-01: 6 mg via INTRAVENOUS
  Filled 2016-12-01: qty 2

## 2016-12-01 MED ORDER — METOCLOPRAMIDE HCL 5 MG/ML IJ SOLN
5.0000 mg | Freq: Once | INTRAMUSCULAR | Status: AC
  Administered 2016-12-01: 5 mg via INTRAVENOUS
  Filled 2016-12-01: qty 2

## 2016-12-01 MED ORDER — DIPHENHYDRAMINE HCL 50 MG/ML IJ SOLN
25.0000 mg | Freq: Once | INTRAMUSCULAR | Status: AC
  Administered 2016-12-01: 25 mg via INTRAVENOUS
  Filled 2016-12-01: qty 1

## 2016-12-01 MED ORDER — HYDROMORPHONE HCL 1 MG/ML IJ SOLN
1.0000 mg | Freq: Once | INTRAMUSCULAR | Status: AC
  Administered 2016-12-01: 1 mg via INTRAVENOUS
  Filled 2016-12-01: qty 1

## 2016-12-01 MED ORDER — OXYCODONE-ACETAMINOPHEN 5-325 MG PO TABS
1.0000 | ORAL_TABLET | Freq: Once | ORAL | Status: AC
  Administered 2016-12-01: 1 via ORAL
  Filled 2016-12-01: qty 1

## 2016-12-01 MED ORDER — MORPHINE SULFATE (PF) 4 MG/ML IV SOLN
4.0000 mg | Freq: Once | INTRAVENOUS | Status: AC
  Administered 2016-12-01: 4 mg via INTRAVENOUS
  Filled 2016-12-01: qty 1

## 2016-12-01 MED ORDER — HEPARIN SOD (PORK) LOCK FLUSH 100 UNIT/ML IV SOLN
500.0000 [IU] | Freq: Once | INTRAVENOUS | Status: AC
  Administered 2016-12-01: 500 [IU]
  Filled 2016-12-01: qty 5

## 2016-12-01 NOTE — ED Triage Notes (Signed)
Pt bib [facility] from home d/t HA.  Pt seen here yesterday for the same.  Pt reported to EMS that once he gets to the hospital he gets, "a shot" that makes the HA better.

## 2016-12-01 NOTE — ED Notes (Signed)
[facility] notified regarding need for patient transportation.

## 2016-12-01 NOTE — Discharge Instructions (Signed)
Take your oxycodone as directed. Call your neurologist tomorrow or call [place] neurology or [place] [facility] to schedule next available office visit. CT scan of your brain today was normal. Your INR (coumadin level) was 2.4, which is within the therapeutic range

## 2016-12-01 NOTE — ED Provider Notes (Signed)
[facility]ERGENCY DEPT Provider Note   CSN: [id number] Arrival date & time: 12/01/16  0524     History   Chief Complaint Chief Complaint  Patient presents with  . Headache    HPI [name] is a 56 y.o. male.  HPI Complains of headache typical of his cluster headache intermittent started 4 days ago pain is severe and throbbing gradual onset last 1 or 2 minutes at a time. Seen here yesterday for same complaint, treated with Compazine and Benadryl and Imitrex and IV normal saline with improvement of symptoms. Nothing makes symptoms better or worse. No other associated symptoms Past Medical History:  Diagnosis Date  . Abdominal pain, unspecified site   . Anxiety   . Arthritis   . Benign paroxysmal positional vertigo   . Chronic back pain   . Chronic pain syndrome 01/25/2008  . Cluster headache   . Depression    takes Zoloft daily  . DVT (deep venous thrombosis) (HCC)    in the left arm '09  . Gait abnormality    "uses mobile wheelchair, but is ambulatory"  . Gallstones 02/17/2009   resolved after gallbladder surgery.  [name] GERD (gastroesophageal reflux disease)    takes Omeprazole as needed  . Headache(784.0)    cluster headaches frequently-takes Topamax daily  . History of colonoscopy   . HTN (hypertension)    takes Lisinopril,Verapamil,and Triamterene HCTZ daily  . Insomnia 11/06/2008  . Joint pain   . Joint swelling    03-07-16 "swelling of right wrist" "after a fall-xray done 03-06-16 "no fractures".  . Memory loss    no an issue at present 03-07-16  . Multiple sclerosis (HCC)    Dx. 2005 - Dr. Tinnie Gens follows LOV 4'17 tx. Tysabri monthly IV-[facility] , [place].  [name] Nonspecific elevation of levels of transaminase or lactic acid dehydrogenase (LDH)   . Other specified visual disturbances   . Other syndromes affecting cervical region   . Pneumonia 2009  . Trigeminal neuralgia     history" Multiple sclerosis"    Patient Active Problem List   Diagnosis Date Noted  . AKI (acute kidney injury) (HCC) 11/15/2016  . Chronic hepatitis C virus infection (HCC) 11/15/2016  . Chronic abdominal pain 11/15/2016  . Chronic pain syndrome 11/15/2016  . Acute retention of urine 11/15/2016  . Generalized abdominal pain   . Chronic cluster headache, not intractable   . Community acquired pneumonia   . TB lung, latent   . HCAP (healthcare-associated pneumonia) 02/16/2016  . Hypokalemia 02/16/2016  . Cluster headache   . DVT (deep venous thrombosis) (HCC)   . Depression   . GERD (gastroesophageal reflux disease)   . HTN (hypertension)   . Essential hypertension   . Gastroesophageal reflux disease without esophagitis   . CAP (community acquired pneumonia)   . Multiple sclerosis (HCC) 08/02/2013  . ABDOMINAL BLOATING 10/14/2010  . LOOSE STOOLS 10/14/2010  . PULMONARY EMBOLISM, HX OF 10/14/2010    Past Surgical History:  Procedure Laterality Date  . CHOLECYSTECTOMY  02/20/2009  . COLONOSCOPY WITH PROPOFOL N/A 03/17/2016   Procedure: COLONOSCOPY WITH PROPOFOL;  Surgeon: [name], MD;  Location: [facility] END[facility]COPY;  Service: Endoscopy;  Laterality: N/A;  . PORT A CATH REVISION N/A 07/06/2015   Procedure: Removal and replacement of PORT A CATH;  Surgeon: [name], MD;  Location[facility] OR;  Service: General;  Laterality: N/A;  . PORTACATH PLACEMENT N/A 03/27/2014   Procedure: INSERTION PORT-A-CATH;  Surgeon: [name], MD;  Location: [facility]  OR;  Service: General;  Laterality: N/A;       Home Medications    Prior to Admission medications   Medication Sig Start Date End Date Taking? Authorizing Provider  baclofen (LIORESAL) 20 MG tablet Take 20 mg by mouth 4 (four) times daily as needed for muscle spasms.     Historical Provider, MD  dalfampridine (AMPYRA) 10 MG TB12 Take 10 mg by mouth 2 (two) times daily.    Historical Provider, MD  Eszopiclone (ESZOPICLONE) 3 MG TABS Take 3 mg by mouth at bedtime as needed (for sleep).     Historical  Provider, MD  gabapentin (NEURONTIN) 600 MG tablet Take 600 mg by mouth 4 (four) times daily.     Historical Provider, MD  levETIRAcetam (KEPPRA) 500 MG tablet Take 500 mg by mouth 2 (two) times daily.    Historical Provider, MD  omeprazole (PRILOSEC) 40 MG capsule Take 40 mg by mouth daily. 10/20/16   Historical Provider, MD  oxyCODONE-acetaminophen (PERCOCET) 5-325 MG tablet Take 1 tablet by mouth every 4 (four) hours as needed for severe pain. 11/10/16   Mancel Bale, MD  oxyCODONE-acetaminophen (PERCOCET/ROXICET) 5-325 MG tablet Take 1-2 tablets by mouth every 6 (six) hours as needed. 11/28/16   Tiffany Neva Seat, PA-C  polyethylene glycol (MIRALAX / GLYCOLAX) packet Take 17 g by mouth daily. 11/22/16   Rodolph Bong, MD  predniSONE (DELTASONE) 10 MG tablet Take 2 tablets (20 mg total) by mouth daily. 11/28/16   Tiffany Neva Seat, PA-C  promethazine (PHENERGAN) 25 MG tablet Take 1 tablet (25 mg total) by mouth every 6 (six) hours as needed (headache). 11/15/16   Gilda Crease, MD  senna-docusate (SENOKOT-S) 8.6-50 MG tablet Take 1 tablet by mouth 2 (two) times daily. 11/21/16   Rodolph Bong, MD  sertraline (ZOLOFT) 50 MG tablet Take 50 mg by mouth daily.     Historical Provider, MD  Sofosbuvir-Velpatasvir (EPCLUSA) 400-100 MG TABS Take 1 tablet by mouth daily. Patient not taking: Reported on 11/28/2016 09/11/16   Judyann Munson, MD  SUMAtriptan (IMITREX) 6 MG/0.5ML SOLN injection Inject 0.5 mLs (6 mg total) into the skin every 12 (twelve) hours as needed for migraine or headache. May repeat in 2 hours if headache persists or recurs. 11/29/16   Azalia Bilis, MD  topiramate (TOPAMAX) 50 MG tablet Take 50 mg by mouth 2 (two) times daily. 02/18/16   Historical Provider, MD  triamterene-hydrochlorothiazide (DYAZIDE) 37.5-25 MG capsule  11/06/16   Historical Provider, MD  verapamil (CALAN-SR) 120 MG CR tablet Take 1 tablet (120 mg total) by mouth at bedtime. 11/30/16   Emi Holes, PA-C  warfarin  (COUMADIN) 5 MG tablet Take 5 mg by mouth daily. 09/29/16   Historical Provider, MD    Family History Family History  Problem Relation Age of Onset  . Cancer Father   . Diabetes Mother     Social History Social History  Substance Use Topics  . Smoking status: Current Every Day Smoker    Packs/day: 1.00    Years: 38.00    Types: Cigarettes  . Smokeless tobacco: Never Used     Comment: cutting back  . Alcohol use 0.0 oz/week     Comment: occasional     Allergies   Patient has no known allergies.   Review of Systems Review of Systems  Constitutional: Negative.   HENT: Negative.   Respiratory: Negative.   Cardiovascular: Negative.   Gastrointestinal: Negative.   Musculoskeletal: Positive for gait problem.  Walks with walker  Skin: Negative.   Neurological: Positive for headaches.  Psychiatric/Behavioral: Negative.      Physical Exam Updated Vital Signs BP 122/81 (BP Location: Right Arm)   Pulse 73   Temp 98.5 F (36.9 C) (Oral)   Resp 18   Ht 5\' 9"  (1.753 m)   Wt 195 lb (88.5 kg)   SpO2 99%   BMI 28.80 kg/m   Physical Exam  Constitutional: He is oriented to person, place, and time. He appears well-developed and well-nourished. No distress.  HENT:  Head: Normocephalic and atraumatic.  Eyes: Conjunctivae are normal. Pupils are equal, round, and reactive to light.  Neck: Neck supple. No tracheal deviation present. No thyromegaly present.  Cardiovascular: Normal rate and regular rhythm.   No murmur heard. Pulmonary/Chest: Effort normal and breath sounds normal.  Abdominal: Soft. Bowel sounds are normal. He exhibits no distension. There is no tenderness.  Musculoskeletal: Normal range of motion. He exhibits no edema or tenderness.  Neurological: He is alert and oriented to person, place, and time. No cranial nerve deficit. Coordination normal.  Moves all extremities well DTR symmetric bilaterally at knee jerk ankle jerk and biceps toes downward going  bilaterally  Skin: Skin is warm and dry. No rash noted.  Psychiatric: He has a normal mood and affect.  Nursing note and vitals reviewed.    ED Treatments / Results  Labs (all labs ordered are listed, but only abnormal results are displayed) Labs Reviewed - No data to display  EKG  EKG Interpretation None       Radiology No results found.  Procedures Procedures (including critical care time)  Medications Ordered in ED Medications  metoCLOPramide (REGLAN) injection 5 mg (not administered)     Initial Impression / Assessment and Plan / ED Course  I have reviewed the triage vital signs and the nursing notes.  Pertinent labs & imaging results that were available during my care of the patient were reviewed by me and considered in my medical decision making (see chart for details).     Patient continues to have headache though he was intermittently well after treatment with intravenous Reglan, morphine. He'll be given oxycodone 1 tablet prior to discharge. I did attempt to call his neurologist at [facility] neurology, no answer. Voice Mail left. [place] controlled substance reporting system queried she received oxycodone-acetaminophen 10-3 25 210 tablets prescribed on 11/28/2016 He'll be referred to [place] [facility] and to [facility] sees requesting referral to another neurology practice Final Clinical Impressions(s) / ED Diagnoses  Diagnosis cluster headache Final diagnoses:  None    New Prescriptions New Prescriptions   No medications on file     Doug Sou, MD 12/01/16 1605

## 2016-12-01 NOTE — ED Notes (Signed)
Pt verbalizes MS and hx of cluster headache. Pt describes headache as ride sided radiating to right eye. Pt verbalizes current headache lasting 2.5 days unrelieved by home regimen "oxygen, oxygen, percocet."

## 2016-12-03 ENCOUNTER — Emergency Department (HOSPITAL_COMMUNITY): Payer: Medicare HMO

## 2016-12-03 ENCOUNTER — Emergency Department (HOSPITAL_COMMUNITY)
Admission: EM | Admit: 2016-12-03 | Discharge: 2016-12-03 | Disposition: A | Discharge status: Home / Self Care | Payer: Medicare HMO | Attending: Emergency Medicine | Admitting: Emergency Medicine

## 2016-12-03 ENCOUNTER — Encounter (HOSPITAL_COMMUNITY): Payer: Self-pay | Admitting: Emergency Medicine

## 2016-12-03 ENCOUNTER — Ambulatory Visit: Payer: Medicare HMO

## 2016-12-03 DIAGNOSIS — R1013 Epigastric pain: Secondary | ICD-10-CM | POA: Diagnosis not present

## 2016-12-03 DIAGNOSIS — R109 Unspecified abdominal pain: Secondary | ICD-10-CM

## 2016-12-03 DIAGNOSIS — I10 Essential (primary) hypertension: Secondary | ICD-10-CM | POA: Diagnosis not present

## 2016-12-03 DIAGNOSIS — J189 Pneumonia, unspecified organism: Secondary | ICD-10-CM | POA: Diagnosis not present

## 2016-12-03 DIAGNOSIS — Z7901 Long term (current) use of anticoagulants: Secondary | ICD-10-CM | POA: Insufficient documentation

## 2016-12-03 DIAGNOSIS — F17210 Nicotine dependence, cigarettes, uncomplicated: Secondary | ICD-10-CM | POA: Insufficient documentation

## 2016-12-03 DIAGNOSIS — G8929 Other chronic pain: Secondary | ICD-10-CM | POA: Insufficient documentation

## 2016-12-03 LAB — COMPREHENSIVE METABOLIC PANEL
ALBUMIN: 3.8 g/dL (ref 3.5–5.0)
ALT: 31 U/L (ref 17–63)
AST: 37 U/L (ref 15–41)
Alkaline Phosphatase: 79 U/L (ref 38–126)
Anion gap: 10 (ref 5–15)
BUN: 15 mg/dL (ref 6–20)
CHLORIDE: 102 mmol/L (ref 101–111)
CO2: 28 mmol/L (ref 22–32)
Calcium: 9.2 mg/dL (ref 8.9–10.3)
Creatinine, Ser: 0.95 mg/dL (ref 0.61–1.24)
GFR calc Af Amer: >60 mL/min (ref >60)
GFR calc non Af Amer: >60 mL/min (ref >60)
GLUCOSE: 147 mg/dL — AB (ref 65–99)
Potassium: 3.8 mmol/L (ref 3.5–5.1)
SODIUM: 140 mmol/L (ref 135–145)
Total Bilirubin: 0.6 mg/dL (ref 0.3–1.2)
Total Protein: 7 g/dL (ref 6.5–8.1)

## 2016-12-03 LAB — CBC WITH DIFFERENTIAL/PLATELET
Basophils Absolute: 0 10*3/uL (ref 0.0–0.1)
Basophils Relative: 0 %
EOS PCT: 0 %
Eosinophils Absolute: 0 10*3/uL (ref 0.0–0.7)
HEMATOCRIT: 37.3 % — AB (ref 39.0–52.0)
Hemoglobin: 12.1 g/dL — ABNORMAL LOW (ref 13.0–17.0)
LYMPHS ABS: 1.8 10*3/uL (ref 0.7–4.0)
LYMPHS PCT: 12 %
MCH: 26.6 pg (ref 26.0–34.0)
MCHC: 32.4 g/dL (ref 30.0–36.0)
MCV: 82 fL (ref 78.0–100.0)
MONO ABS: 0.8 10*3/uL (ref 0.1–1.0)
Monocytes Relative: 6 %
NEUTROS ABS: 11.9 10*3/uL — AB (ref 1.7–7.7)
Neutrophils Relative %: 82 %
PLATELETS: 251 10*3/uL (ref 150–400)
RBC: 4.55 MIL/uL (ref 4.22–5.81)
RDW: 15.9 % — ABNORMAL HIGH (ref 11.5–15.5)
WBC: 14.6 10*3/uL — AB (ref 4.0–10.5)

## 2016-12-03 LAB — URINALYSIS, ROUTINE W REFLEX MICROSCOPIC
Bilirubin Urine: NEGATIVE
GLUCOSE, UA: NEGATIVE mg/dL
HGB URINE DIPSTICK: NEGATIVE
Ketones, ur: NEGATIVE mg/dL
Leukocytes, UA: NEGATIVE
Nitrite: NEGATIVE
Protein, ur: NEGATIVE mg/dL
SPECIFIC GRAVITY, URINE: 1.023 (ref 1.005–1.030)
pH: 6 (ref 5.0–8.0)

## 2016-12-03 LAB — LIPASE, BLOOD: LIPASE: 17 U/L (ref 11–51)

## 2016-12-03 MED ORDER — SODIUM CHLORIDE 0.9 % IV SOLN
Freq: Once | INTRAVENOUS | Status: DC
  Filled 2016-12-03: qty 1000

## 2016-12-03 MED ORDER — LEVOFLOXACIN 500 MG PO TABS: 500.0000 mg | ORAL_TABLET | Freq: Every day | ORAL | 0 refills | Status: DC

## 2016-12-03 MED ORDER — LEVOFLOXACIN 750 MG PO TABS
750.0000 mg | ORAL_TABLET | Freq: Once | ORAL | Status: AC
  Administered 2016-12-03: 750 mg via ORAL
  Filled 2016-12-03: qty 1

## 2016-12-03 MED ORDER — NATALIZUMAB 300 MG/15ML IV CONC
300.0000 mg | Freq: Once | INTRAVENOUS | Status: DC
  Filled 2016-12-03: qty 15

## 2016-12-03 MED ORDER — ACETAMINOPHEN 500 MG PO TABS: 1000.0000 mg | ORAL_TABLET | Freq: Once | ORAL | Status: DC

## 2016-12-03 NOTE — ED Notes (Signed)
[facility] notified regarding need for pt transfer.  

## 2016-12-03 NOTE — ED Notes (Signed)
Patient would like his port accessed for labs and IV fluids.

## 2016-12-03 NOTE — ED Triage Notes (Signed)
Patient is from home. Patient is complaining of left lower quad Abdominal pain. Patient is on 4L of oxygen at home. Patient

## 2016-12-03 NOTE — ED Notes (Signed)
Patient transported to X-ray 

## 2016-12-03 NOTE — ED Provider Notes (Signed)
[facility]-EMERGENCY DEPT Provider Note   CSN: [id number] Arrival date & time: 12/03/16  7619     History   Chief Complaint Chief Complaint  Patient presents with  . Abdominal Pain    HPI [name] is a 56 y.o. male.  HPI  This is a 56 year old male with multiple medical problems including chronic pain syndrome, chronic abdominal pain, DVT, hypertension who presents with abdominal pain. Reports one-day history of epigastric abdominal pain. It is nonradiating. It is currently 8 out of 10. It gets somewhat better with "my pain medication." Patient reports taking Percocet at home. Denies vomiting or diarrhea. Reports normal bowel movements. Denies fevers, urinary symptoms, chest pain, shortness of breath.  Recent history of intractable headache with multiple ER evaluations.  Past Medical History:  Diagnosis Date  . Abdominal pain, unspecified site   . Anxiety   . Arthritis   . Benign paroxysmal positional vertigo   . Chronic back pain   . Chronic pain syndrome 01/25/2008  . Cluster headache   . Depression    takes Zoloft daily  . DVT (deep venous thrombosis) (HCC)    in the left arm '09  . Gait abnormality    "uses mobile wheelchair, but is ambulatory"  . Gallstones 02/17/2009   resolved after gallbladder surgery.  [name] GERD (gastroesophageal reflux disease)    takes Omeprazole as needed  . Headache(784.0)    cluster headaches frequently-takes Topamax daily  . History of colonoscopy   . HTN (hypertension)    takes Lisinopril,Verapamil,and Triamterene HCTZ daily  . Insomnia 11/06/2008  . Joint pain   . Joint swelling    03-07-16 "swelling of right wrist" "after a fall-xray done 03-06-16 "no fractures".  . Memory loss    no an issue at present 03-07-16  . Multiple sclerosis (HCC)    Dx. 2005 - Dr. Tinnie Gens follows LOV 4'17 tx. Tysabri monthly IV-[facility] , [place].  [name] Nonspecific elevation of levels of transaminase or lactic acid dehydrogenase (LDH)   .  Other specified visual disturbances   . Other syndromes affecting cervical region   . Pneumonia 2009  . Trigeminal neuralgia     history" Multiple sclerosis"    Patient Active Problem List   Diagnosis Date Noted  . AKI (acute kidney injury) (HCC) 11/15/2016  . Chronic hepatitis C virus infection (HCC) 11/15/2016  . Chronic abdominal pain 11/15/2016  . Chronic pain syndrome 11/15/2016  . Acute retention of urine 11/15/2016  . Generalized abdominal pain   . Chronic cluster headache, not intractable   . Community acquired pneumonia   . TB lung, latent   . HCAP (healthcare-associated pneumonia) 02/16/2016  . Hypokalemia 02/16/2016  . Cluster headache   . DVT (deep venous thrombosis) (HCC)   . Depression   . GERD (gastroesophageal reflux disease)   . HTN (hypertension)   . Essential hypertension   . Gastroesophageal reflux disease without esophagitis   . CAP (community acquired pneumonia)   . Multiple sclerosis (HCC) 08/02/2013  . ABDOMINAL BLOATING 10/14/2010  . LOOSE STOOLS 10/14/2010  . PULMONARY EMBOLISM, HX OF 10/14/2010    Past Surgical History:  Procedure Laterality Date  . CHOLECYSTECTOMY  02/20/2009  . COLONOSCOPY WITH PROPOFOL N/A 03/17/2016   Procedure: COLONOSCOPY WITH PROPOFOL;  Surgeon: [name], MD;  Location: [facility] ENDOSCOPY;  Service: Endoscopy;  Laterality: N/A;  . PORT A CATH REVISION N/A 07/06/2015   Procedure: Removal and replacement of PORT A CATH;  Surgeon: [name], MD;  Location: [facility] OR;  Service: General;  Laterality: N/A;  . PORTACATH PLACEMENT N/A 03/27/2014   Procedure: INSERTION PORT-A-CATH;  Surgeon: [name], MD;  Location: [facility];  Service: General;  Laterality: N/A;       Home Medications    Prior to Admission medications   Medication Sig Start Date End Date Taking? Authorizing Provider  baclofen (LIORESAL) 20 MG tablet Take 20 mg by mouth 4 (four) times daily.     Historical Provider, MD  Eszopiclone (ESZOPICLONE) 3 MG TABS  Take 3 mg by mouth at bedtime as needed (for sleep).     Historical Provider, MD  gabapentin (NEURONTIN) 600 MG tablet Take 600 mg by mouth 3 (three) times daily.     Historical Provider, MD  levETIRAcetam (KEPPRA) 500 MG tablet Take 500 mg by mouth 2 (two) times daily.    Historical Provider, MD  lisinopril-hydrochlorothiazide (PRINZIDE,ZESTORETIC) 10-12.5 MG tablet Take 1 tablet by mouth daily.  11/25/16   Historical Provider, MD  omeprazole (PRILOSEC) 40 MG capsule Take 40 mg by mouth daily. 10/20/16   Historical Provider, MD  oxyCODONE-acetaminophen (PERCOCET) 10-325 MG tablet Take 1 tablet by mouth every 4 (four) hours as needed for pain.  11/28/16   Historical Provider, MD  polyethylene glycol (MIRALAX / GLYCOLAX) packet Take 17 g by mouth daily. 11/22/16   Rodolph Bong, MD  predniSONE (DELTASONE) 10 MG tablet Take 2 tablets (20 mg total) by mouth daily. 11/28/16   Tiffany Neva Seat, PA-C  promethazine (PHENERGAN) 25 MG tablet Take 1 tablet (25 mg total) by mouth every 6 (six) hours as needed (headache). Patient not taking: Reported on 12/01/2016 11/15/16   Gilda Crease, MD  senna-docusate (SENOKOT-S) 8.6-50 MG tablet Take 1 tablet by mouth 2 (two) times daily. Patient not taking: Reported on 12/01/2016 11/21/16   Rodolph Bong, MD  sertraline (ZOLOFT) 50 MG tablet Take 50 mg by mouth daily.     Historical Provider, MD  Sofosbuvir-Velpatasvir (EPCLUSA) 400-100 MG TABS Take 1 tablet by mouth daily. Patient not taking: Reported on 11/28/2016 09/11/16   Judyann Munson, MD  SUMAtriptan (IMITREX) 6 MG/0.5ML SOLN injection Inject 0.5 mLs (6 mg total) into the skin every 12 (twelve) hours as needed for migraine or headache. May repeat in 2 hours if headache persists or recurs. 11/29/16   Azalia Bilis, MD  verapamil (CALAN) 80 MG tablet Take 80 mg by mouth 2 (two) times daily.    Historical Provider, MD  verapamil (CALAN-SR) 120 MG CR tablet Take 1 tablet (120 mg total) by mouth at bedtime. 11/30/16    Emi Holes, PA-C  warfarin (COUMADIN) 5 MG tablet Take 5 mg by mouth daily. 09/29/16   Historical Provider, MD    Family History Family History  Problem Relation Age of Onset  . Cancer Father   . Diabetes Mother     Social History Social History  Substance Use Topics  . Smoking status: Current Every Day Smoker    Packs/day: 1.00    Years: 38.00    Types: Cigarettes  . Smokeless tobacco: Never Used     Comment: cutting back  . Alcohol use 0.0 oz/week     Comment: occasional     Allergies   Patient has no known allergies.   Review of Systems Review of Systems  Constitutional: Negative for fever.  Respiratory: Negative for shortness of breath.   Cardiovascular: Negative for chest pain.  Gastrointestinal: Positive for abdominal pain. Negative for constipation, diarrhea, nausea and vomiting.  Genitourinary: Negative  for dysuria.  Neurological: Negative for headaches.  All other systems reviewed and are negative.    Physical Exam Updated Vital Signs BP 123/78   Pulse 89   Temp 99.8 F (37.7 C) (Oral)   Resp 18   Ht 5\' 9"  (1.753 m)   Wt 195 lb (88.5 kg)   SpO2 95%   BMI 28.80 kg/m   Physical Exam  Constitutional: He is oriented to person, place, and time. No distress.  HENT:  Head: Normocephalic and atraumatic.  Eyes: Pupils are equal, round, and reactive to light.  Cardiovascular: Normal rate, regular rhythm and normal heart sounds.   No murmur heard. Pulmonary/Chest: Effort normal and breath sounds normal. No respiratory distress. He has no wheezes.  Port palpated in chest  Abdominal: Soft. He exhibits no mass. There is no tenderness. There is no rebound.  Slightly hyperactive bowel sounds  Musculoskeletal: He exhibits no edema.  Neurological: He is alert and oriented to person, place, and time.  Skin: Skin is warm and dry.  Psychiatric: He has a normal mood and affect.  Nursing note and vitals reviewed.    ED Treatments / Results   Labs (all labs ordered are listed, but only abnormal results are displayed) Labs Reviewed  COMPREHENSIVE METABOLIC PANEL - Abnormal; Notable for the following:       Result Value   Glucose, Bld 147 (*)    All other components within normal limits  CBC WITH DIFFERENTIAL/PLATELET - Abnormal; Notable for the following:    WBC 14.6 (*)    Hemoglobin 12.1 (*)    HCT 37.3 (*)    RDW 15.9 (*)    Neutro Abs 11.9 (*)    All other components within normal limits  LIPASE, BLOOD  URINALYSIS, ROUTINE W REFLEX MICROSCOPIC    EKG  EKG Interpretation None       Radiology Ct Head Wo Contrast  Result Date: 12/01/2016 CLINICAL DATA:  Headache. EXAM: CT HEAD WITHOUT CONTRAST TECHNIQUE: Contiguous axial images were obtained from the base of the skull through the vertex without intravenous contrast. COMPARISON:  CT scan of February 16, 2016. FINDINGS: Brain: No evidence of acute infarction, hemorrhage, hydrocephalus, extra-axial collection or mass lesion/mass effect. Vascular: No hyperdense vessel or unexpected calcification. Skull: Normal. Negative for fracture or focal lesion. Sinuses/Orbits: No acute finding. Other: None. IMPRESSION: Normal head CT. Electronically Signed   By: Lupita Raider, M.D.   On: 12/01/2016 14:59   Dg Abdomen Acute W/chest  Result Date: 12/03/2016 CLINICAL DATA:  Initial evaluation for acute cough, congestion, left-sided abdominal pain. EXAM: DG ABDOMEN ACUTE W/ 1V CHEST COMPARISON:  Prior radiograph from 02/15/2011. Comparison also made with prior CT from 11/15/2016. FINDINGS: Right-sided Port-A-Cath in place. Cardiac and mediastinal silhouettes within normal limits. Lungs hypoinflated. Consolidative opacity within the right upper lobe. Additional patchy infiltrate within the left upper lobe and possibly right lower lobe. Findings concerning for multifocal pneumonia. No pulmonary edema or pleural effusion. No pneumothorax. Bowel gas pattern within normal limits without evidence  for obstruction or ileus. No abnormal bowel wall thickening. No free air. No soft tissue mass or abnormal calcification. Cholecystectomy clips noted. Scattered irregular sclerotic density noted within the right iliac wing, of doubtful clinical significance. Scattered calcific densities overlying the lower flanks noted bilaterally, stable from previous CT. IMPRESSION: 1. Multifocal airspace opacities involving primarily the right and left upper lobes, concerning for multifocal pneumonia. 2. Nonobstructive bowel gas pattern with no radiographic evidence for acute intra-abdominal process. Electronically Signed  By: Rise Mu M.D.   On: 12/03/2016 06:35    Procedures Procedures (including critical care time)  Medications Ordered in ED Medications - No data to display   Initial Impression / Assessment and Plan / ED Course  I have reviewed the triage vital signs and the nursing notes.  Pertinent labs & imaging results that were available during my care of the patient were reviewed by me and considered in my medical decision making (see chart for details).     Patient presents with abdominal pain. Recent multiple evaluations. Does have a history of chronic abdominal pain. He is nontender on exam. On multiple reassessments he is found sleeping. He was not given any pain medication. Vital signs reassuring. He is on his home oxygen requirement. Basic labwork obtained. He does have a new leukocytosis and chest x-ray is concerning for pneumonia. On recheck, patient does endorse cough. No documented fevers at home. Given recent multiple medical evaluations and one hospitalization, will treat with Levaquin. He is otherwise nontoxic-appearing. Urine pending.  Final Clinical Impressions(s) / ED Diagnoses   Final diagnoses:  None    New Prescriptions New Prescriptions   No medications on file     Shon Baton, MD 12/03/16 [phone]

## 2016-12-03 NOTE — ED Notes (Signed)
Patient returned from X-ray 

## 2016-12-03 NOTE — ED Notes (Signed)
ED Provider at bedside. 

## 2016-12-05 ENCOUNTER — Inpatient Hospital Stay: Payer: Medicare HMO

## 2016-12-05 DIAGNOSIS — G35 Multiple sclerosis: Secondary | ICD-10-CM | POA: Diagnosis not present

## 2016-12-05 DIAGNOSIS — Z79899 Other long term (current) drug therapy: Secondary | ICD-10-CM | POA: Diagnosis not present

## 2016-12-05 MED ORDER — SODIUM CHLORIDE 0.9% FLUSH
10.0000 mL | INTRAVENOUS | Status: DC | PRN
  Administered 2016-12-05: 10 mL via INTRAVENOUS
  Filled 2016-12-05: qty 10

## 2016-12-05 MED ORDER — SODIUM CHLORIDE 0.9 % IV SOLN
300.0000 mg | Freq: Once | INTRAVENOUS | Status: AC
  Administered 2016-12-05: 300 mg via INTRAVENOUS
  Filled 2016-12-05: qty 15

## 2016-12-05 MED ORDER — HEPARIN SOD (PORK) LOCK FLUSH 100 UNIT/ML IV SOLN
500.0000 [IU] | Freq: Once | INTRAVENOUS | Status: AC
  Administered 2016-12-05: 500 [IU] via INTRAVENOUS

## 2016-12-05 MED ORDER — HEPARIN SOD (PORK) LOCK FLUSH 100 UNIT/ML IV SOLN
INTRAVENOUS | Status: AC
  Filled 2016-12-05: qty 5

## 2016-12-05 MED ORDER — SODIUM CHLORIDE 0.9 % IV SOLN
300.0000 mg | Freq: Once | INTRAVENOUS | Status: DC
  Filled 2016-12-05: qty 15

## 2016-12-05 MED ORDER — SODIUM CHLORIDE 0.9 % IV SOLN
Freq: Once | INTRAVENOUS | Status: AC
  Administered 2016-12-05: 11:00:00 via INTRAVENOUS
  Filled 2016-12-05: qty 1000

## 2016-12-05 MED ORDER — ACETAMINOPHEN 500 MG PO TABS: 1000.0000 mg | ORAL_TABLET | Freq: Once | ORAL | Status: DC

## 2016-12-05 NOTE — Addendum Note (Signed)
Addended by: Darrold Span A on: 12/05/2016 01:21 PM   Modules accepted: Orders, SmartSet

## 2016-12-09 ENCOUNTER — Inpatient Hospital Stay: Payer: Medicare Other

## 2016-12-11 ENCOUNTER — Inpatient Hospital Stay: Payer: Medicare HMO

## 2016-12-12 ENCOUNTER — Telehealth: Payer: Self-pay | Admitting: *Deleted

## 2016-12-12 ENCOUNTER — Ambulatory Visit: Payer: Medicare Other

## 2016-12-12 NOTE — Telephone Encounter (Signed)
Office note requested from [facility]; last requested 07/2016 and August note faxed. Refaxed, along with pharmacy note from November, confirmation received and called back and left a message for Shanda Bumps, CMA to contact his PCP, Dr. Concepcion Elk for office notes also.

## 2016-12-15 ENCOUNTER — Ambulatory Visit: Payer: Self-pay

## 2016-12-16 ENCOUNTER — Inpatient Hospital Stay: Payer: Medicare Other

## 2016-12-16 NOTE — Telephone Encounter (Signed)
Faxed pharmacy clinic note from 09/22/16 to referring provider, Dr Tinnie Gens, attention Shanda Bumps. Andree Coss, RN

## 2016-12-17 ENCOUNTER — Emergency Department (HOSPITAL_COMMUNITY): Payer: Medicare HMO

## 2016-12-17 ENCOUNTER — Emergency Department (HOSPITAL_COMMUNITY)
Admission: EM | Admit: 2016-12-17 | Discharge: 2016-12-19 | Disposition: A | Discharge status: Home / Self Care | Payer: Medicare HMO | Attending: Emergency Medicine | Admitting: Emergency Medicine

## 2016-12-17 ENCOUNTER — Encounter (HOSPITAL_COMMUNITY): Payer: Self-pay | Admitting: Emergency Medicine

## 2016-12-17 DIAGNOSIS — F339 Major depressive disorder, recurrent, unspecified: Secondary | ICD-10-CM | POA: Diagnosis not present

## 2016-12-17 DIAGNOSIS — G44001 Cluster headache syndrome, unspecified, intractable: Secondary | ICD-10-CM | POA: Diagnosis not present

## 2016-12-17 DIAGNOSIS — I10 Essential (primary) hypertension: Secondary | ICD-10-CM | POA: Insufficient documentation

## 2016-12-17 DIAGNOSIS — F17210 Nicotine dependence, cigarettes, uncomplicated: Secondary | ICD-10-CM | POA: Diagnosis not present

## 2016-12-17 DIAGNOSIS — F331 Major depressive disorder, recurrent, moderate: Secondary | ICD-10-CM | POA: Diagnosis not present

## 2016-12-17 DIAGNOSIS — R51 Headache: Secondary | ICD-10-CM | POA: Diagnosis present

## 2016-12-17 DIAGNOSIS — Z7901 Long term (current) use of anticoagulants: Secondary | ICD-10-CM | POA: Insufficient documentation

## 2016-12-17 DIAGNOSIS — R45851 Suicidal ideations: Secondary | ICD-10-CM | POA: Diagnosis not present

## 2016-12-17 DIAGNOSIS — Z79899 Other long term (current) drug therapy: Secondary | ICD-10-CM | POA: Diagnosis not present

## 2016-12-17 DIAGNOSIS — Z9049 Acquired absence of other specified parts of digestive tract: Secondary | ICD-10-CM | POA: Diagnosis not present

## 2016-12-17 DIAGNOSIS — Z833 Family history of diabetes mellitus: Secondary | ICD-10-CM | POA: Diagnosis not present

## 2016-12-17 DIAGNOSIS — R531 Weakness: Secondary | ICD-10-CM

## 2016-12-17 DIAGNOSIS — Z9889 Other specified postprocedural states: Secondary | ICD-10-CM | POA: Diagnosis not present

## 2016-12-17 LAB — RAPID URINE DRUG SCREEN, HOSP PERFORMED
Amphetamines: NOT DETECTED
BENZODIAZEPINES: NOT DETECTED
Barbiturates: NOT DETECTED
COCAINE: NOT DETECTED
OPIATES: POSITIVE — AB
TETRAHYDROCANNABINOL: NOT DETECTED

## 2016-12-17 LAB — CBC WITH DIFFERENTIAL/PLATELET
Basophils Absolute: 0.2 10*3/uL — ABNORMAL HIGH (ref 0.0–0.1)
Basophils Relative: 1 %
EOS ABS: 0.6 10*3/uL (ref 0.0–0.7)
Eosinophils Relative: 3 %
HCT: 40.1 % (ref 39.0–52.0)
Hemoglobin: 13.5 g/dL (ref 13.0–17.0)
LYMPHS ABS: 9.5 10*3/uL — AB (ref 0.7–4.0)
Lymphocytes Relative: 45 %
MCH: 26.6 pg (ref 26.0–34.0)
MCHC: 33.7 g/dL (ref 30.0–36.0)
MCV: 79.1 fL (ref 78.0–100.0)
MONO ABS: 1.5 10*3/uL — AB (ref 0.1–1.0)
MONOS PCT: 7 %
NEUTROS ABS: 9.2 10*3/uL — AB (ref 1.7–7.7)
Neutrophils Relative %: 44 %
PLATELETS: 352 10*3/uL (ref 150–400)
RBC: 5.07 MIL/uL (ref 4.22–5.81)
RDW: 15 % (ref 11.5–15.5)
WBC: 21 10*3/uL — AB (ref 4.0–10.5)
nRBC: 1 /100 WBC — ABNORMAL HIGH

## 2016-12-17 LAB — COMPREHENSIVE METABOLIC PANEL
ALT: 141 U/L — ABNORMAL HIGH (ref 17–63)
ANION GAP: 6 (ref 5–15)
AST: 27 U/L (ref 15–41)
Albumin: 3.8 g/dL (ref 3.5–5.0)
Alkaline Phosphatase: 99 U/L (ref 38–126)
BUN: 16 mg/dL (ref 6–20)
CALCIUM: 8.8 mg/dL — AB (ref 8.9–10.3)
CHLORIDE: 94 mmol/L — AB (ref 101–111)
CO2: 29 mmol/L (ref 22–32)
Creatinine, Ser: 0.77 mg/dL (ref 0.61–1.24)
Glucose, Bld: 104 mg/dL — ABNORMAL HIGH (ref 65–99)
Potassium: 4.4 mmol/L (ref 3.5–5.1)
SODIUM: 129 mmol/L — AB (ref 135–145)
Total Bilirubin: 0.4 mg/dL (ref 0.3–1.2)
Total Protein: 7.2 g/dL (ref 6.5–8.1)

## 2016-12-17 LAB — ETHANOL: Alcohol, Ethyl (B): <5 mg/dL (ref <5)

## 2016-12-17 LAB — PROTIME-INR
INR: 1.97
Prothrombin Time: 22.7 seconds — ABNORMAL HIGH (ref 11.4–15.2)

## 2016-12-17 MED ORDER — ONDANSETRON HCL 4 MG PO TABS: 4.0000 mg | ORAL_TABLET | Freq: Three times a day (TID) | ORAL | Status: DC | PRN

## 2016-12-17 MED ORDER — SODIUM CHLORIDE 0.9 % IV BOLUS (SEPSIS)
1000.0000 mL | Freq: Once | INTRAVENOUS | Status: AC
  Administered 2016-12-17: 1000 mL via INTRAVENOUS

## 2016-12-17 MED ORDER — ZOLPIDEM TARTRATE 5 MG PO TABS: 5.0000 mg | ORAL_TABLET | Freq: Every evening | ORAL | Status: DC | PRN

## 2016-12-17 MED ORDER — HEPARIN SOD (PORK) LOCK FLUSH 100 UNIT/ML IV SOLN
500.0000 [IU] | Freq: Once | INTRAVENOUS | Status: AC
  Administered 2016-12-18: 500 [IU]
  Filled 2016-12-17: qty 5

## 2016-12-17 MED ORDER — ONDANSETRON HCL 4 MG/2ML IJ SOLN
4.0000 mg | Freq: Once | INTRAMUSCULAR | Status: AC
  Administered 2016-12-17: 4 mg via INTRAVENOUS
  Filled 2016-12-17: qty 2

## 2016-12-17 MED ORDER — HYDROMORPHONE HCL 1 MG/ML IJ SOLN
1.0000 mg | Freq: Once | INTRAMUSCULAR | Status: AC
  Administered 2016-12-17: 1 mg via INTRAVENOUS
  Filled 2016-12-17: qty 1

## 2016-12-17 MED ORDER — ALUM & MAG HYDROXIDE-SIMETH 200-200-20 MG/5ML PO SUSP: 30.0000 mL | ORAL | Status: DC | PRN

## 2016-12-17 MED ORDER — KETOROLAC TROMETHAMINE 30 MG/ML IJ SOLN
30.0000 mg | Freq: Once | INTRAMUSCULAR | Status: AC
  Administered 2016-12-17: 30 mg via INTRAVENOUS
  Filled 2016-12-17: qty 1

## 2016-12-17 MED ORDER — GADOBENATE DIMEGLUMINE 529 MG/ML IV SOLN
18.0000 mL | Freq: Once | INTRAVENOUS | Status: AC | PRN
  Administered 2016-12-17: 18 mL via INTRAVENOUS

## 2016-12-17 MED ORDER — SUMATRIPTAN SUCCINATE 6 MG/0.5ML ~~LOC~~ SOLN
6.0000 mg | Freq: Once | SUBCUTANEOUS | Status: AC
  Administered 2016-12-17: 6 mg via SUBCUTANEOUS
  Filled 2016-12-17: qty 0.5

## 2016-12-17 MED ORDER — DIPHENHYDRAMINE HCL 50 MG/ML IJ SOLN
12.5000 mg | Freq: Once | INTRAMUSCULAR | Status: AC
  Administered 2016-12-17: 12.5 mg via INTRAVENOUS
  Filled 2016-12-17: qty 1

## 2016-12-17 MED ORDER — HALOPERIDOL LACTATE 5 MG/ML IJ SOLN
2.0000 mg | Freq: Once | INTRAMUSCULAR | Status: AC
  Administered 2016-12-17: 2 mg via INTRAVENOUS
  Filled 2016-12-17: qty 1

## 2016-12-17 MED ORDER — LORAZEPAM 1 MG PO TABS: 1.0000 mg | ORAL_TABLET | Freq: Three times a day (TID) | ORAL | Status: DC | PRN

## 2016-12-17 MED ORDER — ACETAMINOPHEN 325 MG PO TABS
650.0000 mg | ORAL_TABLET | ORAL | Status: DC | PRN
  Filled 2016-12-17: qty 2

## 2016-12-17 MED ORDER — METOCLOPRAMIDE HCL 5 MG/ML IJ SOLN
10.0000 mg | Freq: Once | INTRAMUSCULAR | Status: AC
  Administered 2016-12-17: 10 mg via INTRAVENOUS
  Filled 2016-12-17: qty 2

## 2016-12-17 NOTE — ED Notes (Addendum)
Pt hit call bell and stated to the secretary that he was tired of the pain and was going to kill himself. RN notified and went to speak to patient.

## 2016-12-17 NOTE — ED Notes (Signed)
Unable to collect labs patient wants his port access 

## 2016-12-17 NOTE — ED Provider Notes (Signed)
[facility]-EMERGENCY DEPT Provider Note   CSN: [id number] Arrival date & time: 12/17/16  4132     History   Chief Complaint Chief Complaint  Patient presents with  . Headache    HPI [name] is a 56 y.o. male.  The history is provided by the patient. No language interpreter was used.  Headache   This is a chronic problem. The current episode started more than 2 days ago. The problem occurs constantly. The problem has been gradually worsening. The headache is associated with nothing. The pain is severe. The pain does not radiate. Pertinent negatives include no nausea. He has tried nothing for the symptoms.   Pt has a history of cluster headache.  Pt has had for several years.  Pt has a history of MS.  Pt saw his neurologist yesterday.  Pt has just finished a prednisone taper.  He reports no relief from prednisone.  Pt is on 02 at home.  Past Medical History:  Diagnosis Date  . Abdominal pain, unspecified site   . Anxiety   . Arthritis   . Benign paroxysmal positional vertigo   . Chronic back pain   . Chronic pain syndrome 01/25/2008  . Cluster headache   . Depression    takes Zoloft daily  . DVT (deep venous thrombosis) (HCC)    in the left arm '09  . Gait abnormality    "uses mobile wheelchair, but is ambulatory"  . Gallstones 02/17/2009   resolved after gallbladder surgery.  [name] GERD (gastroesophageal reflux disease)    takes Omeprazole as needed  . Headache(784.0)    cluster headaches frequently-takes Topamax daily  . History of colonoscopy   . HTN (hypertension)    takes Lisinopril,Verapamil,and Triamterene HCTZ daily  . Insomnia 11/06/2008  . Joint pain   . Joint swelling    03-07-16 "swelling of right wrist" "after a fall-xray done 03-06-16 "no fractures".  . Memory loss    no an issue at present 03-07-16  . Multiple sclerosis (HCC)    Dx. 2005 - Dr. Tinnie Gens follows LOV 4'17 tx. Tysabri monthly IV-[facility] , [place].  [name] Nonspecific elevation of  levels of transaminase or lactic acid dehydrogenase (LDH)   . Other specified visual disturbances   . Other syndromes affecting cervical region   . Pneumonia 2009  . Trigeminal neuralgia     history" Multiple sclerosis"    Patient Active Problem List   Diagnosis Date Noted  . AKI (acute kidney injury) (HCC) 11/15/2016  . Chronic hepatitis C virus infection (HCC) 11/15/2016  . Chronic abdominal pain 11/15/2016  . Chronic pain syndrome 11/15/2016  . Acute retention of urine 11/15/2016  . Generalized abdominal pain   . Chronic cluster headache, not intractable   . Community acquired pneumonia   . TB lung, latent   . HCAP (healthcare-associated pneumonia) 02/16/2016  . Hypokalemia 02/16/2016  . Cluster headache   . DVT (deep venous thrombosis) (HCC)   . Depression   . GERD (gastroesophageal reflux disease)   . HTN (hypertension)   . Essential hypertension   . Gastroesophageal reflux disease without esophagitis   . CAP (community acquired pneumonia)   . Multiple sclerosis (HCC) 08/02/2013  . ABDOMINAL BLOATING 10/14/2010  . LOOSE STOOLS 10/14/2010  . PULMONARY EMBOLISM, HX OF 10/14/2010    Past Surgical History:  Procedure Laterality Date  . CHOLECYSTECTOMY  02/20/2009  . COLONOSCOPY WITH PROPOFOL N/A 03/17/2016   Procedure: COLONOSCOPY WITH PROPOFOL;  Surgeon: [name], MD;  Location: [facility];  Service: Endoscopy;  Laterality: N/A;  . PORT A CATH REVISION N/A 07/06/2015   Procedure: Removal and replacement of PORT A CATH;  Surgeon: [name], MD;  Location: [facility] OR;  Service: General;  Laterality: N/A;  . PORTACATH PLACEMENT N/A 03/27/2014   Procedure: INSERTION PORT-A-CATH;  Surgeon: [name], MD;  Location: [facility] OR;  Service: General;  Laterality: N/A;       Home Medications    Prior to Admission medications   Medication Sig Start Date End Date Taking? Authorizing Provider  baclofen (LIORESAL) 20 MG tablet Take 20 mg by mouth 4 (four) times daily.     Yes Historical Provider, MD  dalfampridine (AMPYRA) 10 MG TB12 Take 10 mg by mouth 2 (two) times daily.   Yes Historical Provider, MD  Eszopiclone (ESZOPICLONE) 3 MG TABS Take 3 mg by mouth at bedtime as needed (for sleep).    Yes Historical Provider, MD  gabapentin (NEURONTIN) 600 MG tablet Take 600 mg by mouth 3 (three) times daily.    Yes Historical Provider, MD  hydrochlorothiazide (HYDRODIURIL) 25 MG tablet Take 25 mg by mouth daily. 12/03/16  Yes Historical Provider, MD  levETIRAcetam (KEPPRA) 500 MG tablet Take 500 mg by mouth 2 (two) times daily.   Yes Historical Provider, MD  lisinopril-hydrochlorothiazide (PRINZIDE,ZESTORETIC) 10-12.5 MG tablet Take 1 tablet by mouth daily. 11/25/16  Yes Historical Provider, MD  omeprazole (PRILOSEC) 40 MG capsule Take 40 mg by mouth daily. 10/20/16  Yes Historical Provider, MD  oxyCODONE-acetaminophen (PERCOCET) 10-325 MG tablet Take 1 tablet by mouth every 4 (four) hours as needed for pain.  11/28/16  Yes Historical Provider, MD  polyethylene glycol (MIRALAX / GLYCOLAX) packet Take 17 g by mouth daily. 11/22/16  Yes Rodolph Bong, MD  predniSONE (DELTASONE) 20 MG tablet Take by mouth as directed. Take 4 tabs daily x 5 days, take 3 tabs daily x 2 days, take 2 tabs daily x 2 days, 1 tab daily x 2 days, one-half tablet daily x 2 days, then STOP. 12/10/16  Yes Historical Provider, MD  sertraline (ZOLOFT) 50 MG tablet Take 50 mg by mouth daily.    Yes Historical Provider, MD  SUMAtriptan (IMITREX) 6 MG/0.5ML SOLN injection Inject 0.5 mLs (6 mg total) into the skin every 12 (twelve) hours as needed for migraine or headache. May repeat in 2 hours if headache persists or recurs. 11/29/16  Yes Azalia Bilis, MD  triamterene-hydrochlorothiazide (DYAZIDE) 37.5-25 MG capsule Take 1 capsule by mouth daily as needed (blood pressure/edema).  12/03/16  Yes Historical Provider, MD  verapamil (CALAN-SR) 120 MG CR tablet Take 1 tablet (120 mg total) by mouth at bedtime. 11/30/16   Yes Alexandra M Law, PA-C  warfarin (COUMADIN) 5 MG tablet Take 5 mg by mouth daily. 09/29/16  Yes Historical Provider, MD  levofloxacin (LEVAQUIN) 500 MG tablet Take 1 tablet (500 mg total) by mouth daily. Patient not taking: Reported on 12/17/2016 12/03/16   Shon Baton, MD  predniSONE (DELTASONE) 10 MG tablet Take 2 tablets (20 mg total) by mouth daily. Patient not taking: Reported on 12/17/2016 11/28/16   Marlon Pel, PA-C  promethazine (PHENERGAN) 25 MG tablet Take 1 tablet (25 mg total) by mouth every 6 (six) hours as needed (headache). Patient not taking: Reported on 12/01/2016 11/15/16   Gilda Crease, MD  senna-docusate (SENOKOT-S) 8.6-50 MG tablet Take 1 tablet by mouth 2 (two) times daily. Patient not taking: Reported on 12/01/2016 11/21/16   Rodolph Bong, MD  Sofosbuvir-Velpatasvir (EPCLUSA) 400-100 MG TABS Take 1 tablet by mouth daily. Patient not taking: Reported on 11/28/2016 09/11/16   Judyann Munson, MD    Family History Family History  Problem Relation Age of Onset  . Cancer Father   . Diabetes Mother     Social History Social History  Substance Use Topics  . Smoking status: Current Every Day Smoker    Packs/day: 1.00    Years: 38.00    Types: Cigarettes  . Smokeless tobacco: Never Used     Comment: cutting back  . Alcohol use 0.0 oz/week     Comment: occasional     Allergies   Patient has no known allergies.   Review of Systems Review of Systems  Gastrointestinal: Negative for nausea.  Neurological: Positive for dizziness and headaches.  All other systems reviewed and are negative.    Physical Exam Updated Vital Signs BP 122/82 (BP Location: Left Arm)   Pulse 73   Temp 98.1 F (36.7 C) (Oral)   Resp 22   SpO2 100%   Physical Exam  Constitutional: He appears well-developed and well-nourished.  HENT:  Head: Normocephalic and atraumatic.  Right Ear: External ear normal.  Left Ear: External ear normal.  Eyes: Conjunctivae are  normal.  Neck: Normal range of motion. Neck supple.  Cardiovascular: Normal rate and regular rhythm.   No murmur heard. Pulmonary/Chest: Effort normal and breath sounds normal. No respiratory distress.  Abdominal: Soft. There is no tenderness.  Musculoskeletal: Normal range of motion. He exhibits no edema.  Neurological: He is alert.  Skin: Skin is warm and dry.  Psychiatric: He has a normal mood and affect.  Nursing note and vitals reviewed.    ED Treatments / Results  Labs (all labs ordered are listed, but only abnormal results are displayed) Labs Reviewed  CBC WITH DIFFERENTIAL/PLATELET - Abnormal; Notable for the following:       Result Value   WBC 21.0 (*)    nRBC 1 (*)    Neutro Abs 9.2 (*)    Lymphs Abs 9.5 (*)    Monocytes Absolute 1.5 (*)    Basophils Absolute 0.2 (*)    All other components within normal limits  COMPREHENSIVE METABOLIC PANEL - Abnormal; Notable for the following:    Sodium 129 (*)    Chloride 94 (*)    Glucose, Bld 104 (*)    Calcium 8.8 (*)    ALT 141 (*)    All other components within normal limits  PATHOLOGIST SMEAR REVIEW    EKG  EKG Interpretation None       Radiology Mr Maxine Glenn Head Wo Contrast  Result Date: 12/17/2016 CLINICAL DATA:  Headache beginning at 4 a.m. Weakness. History of multiple sclerosis. Chronic pain syndrome, however of Roxicodone . EXAM: MRI HEAD WITH AND WITHOUT CONTRAST MRA HEAD WITHOUT CONTRAST TECHNIQUE: Multiplanar, multiecho pulse sequences of the brain and surrounding structures were obtained with and without intravenous contrast. Angiographic images of the head were obtained using MRA technique without contrast. CONTRAST:  18 cc MultiHance CT HEAD December 01, 2016 and MRI of the head November 16, 2016 FINDINGS: MRI HEAD FINDINGS- multiple sequences are moderately motion degraded. BRAIN: No reduced diffusion to suggest acute ischemia nor hyperacute demyelination. No susceptibility artifact to suggest hemorrhage. The  ventricles and sulci are normal for patient's age. Stable appearance of greater than 10 scattered subcentimeter supratentorial white matter T2 hyperintensities, at least 1 of which radiate from the periventricular margin RIGHT occipital lobe, unchanged. No abnormal  extra-axial fluid collections. No abnormal intraparenchymal or extra-axial enhancement. Ventricles and sulci are normal for patient's age. No midline shift, mass effect or masses. No abnormal extra-axial fluid collections, extra-axial enhancement or masses. VASCULAR: Normal major intracranial vascular flow voids present at skull base. SKULL AND UPPER CERVICAL SPINE: No abnormal sellar expansion. No suspicious calvarial bone marrow signal. Craniocervical junction maintained. SINUSES/ORBITS: The mastoid air-cells and included paranasal sinuses are well-aerated. The included ocular globes and orbital contents are non-suspicious. OTHER: None. MRA HEAD FINDINGS- mild motion degraded examination. ANTERIOR CIRCULATION: Normal flow related enhancement of the included cervical, petrous, cavernous and supraclinoid internal carotid arteries. Patent anterior communicating artery. Normal flow related enhancement of the anterior and middle cerebral arteries, including distal segments. On maximum intensity projected reformations, there is apparent stenosis of RIGHT M2 origin though this is seen as flow artifact on the raw data. No large vessel occlusion, high-grade stenosis, abnormal luminal irregularity, aneurysm. POSTERIOR CIRCULATION: Codominant vertebral artery's. Artery is patent, with normal flow related enhancement of the main branch vessels. Normal flow related enhancement of the posterior cerebral arteries. No large vessel occlusion, high-grade stenosis, abnormal luminal irregularity, aneurysm. ANATOMIC VARIANTS: None. IMPRESSION: MRI HEAD: No acute intracranial process on this motion degraded examination. Stable examination: Greater than 10 supratentorial  white matter lesions, some of which are typical of chronic demyelination. No new lesions and no abnormal enhancement. No parenchymal brain volume loss for age. MRA HEAD: No emergent large vessel occlusion or severe stenosis on this motion degraded examination. Electronically Signed   By: Awilda Metro M.D.   On: 12/17/2016 16:57   Mr Laqueta Jean And Wo Contrast  Result Date: 12/17/2016 CLINICAL DATA:  Headache beginning at 4 a.m. Weakness. History of multiple sclerosis. Chronic pain syndrome, however of Roxicodone . EXAM: MRI HEAD WITH AND WITHOUT CONTRAST MRA HEAD WITHOUT CONTRAST TECHNIQUE: Multiplanar, multiecho pulse sequences of the brain and surrounding structures were obtained with and without intravenous contrast. Angiographic images of the head were obtained using MRA technique without contrast. CONTRAST:  18 cc MultiHance CT HEAD December 01, 2016 and MRI of the head November 16, 2016 FINDINGS: MRI HEAD FINDINGS- multiple sequences are moderately motion degraded. BRAIN: No reduced diffusion to suggest acute ischemia nor hyperacute demyelination. No susceptibility artifact to suggest hemorrhage. The ventricles and sulci are normal for patient's age. Stable appearance of greater than 10 scattered subcentimeter supratentorial white matter T2 hyperintensities, at least 1 of which radiate from the periventricular margin RIGHT occipital lobe, unchanged. No abnormal extra-axial fluid collections. No abnormal intraparenchymal or extra-axial enhancement. Ventricles and sulci are normal for patient's age. No midline shift, mass effect or masses. No abnormal extra-axial fluid collections, extra-axial enhancement or masses. VASCULAR: Normal major intracranial vascular flow voids present at skull base. SKULL AND UPPER CERVICAL SPINE: No abnormal sellar expansion. No suspicious calvarial bone marrow signal. Craniocervical junction maintained. SINUSES/ORBITS: The mastoid air-cells and included paranasal sinuses are  well-aerated. The included ocular globes and orbital contents are non-suspicious. OTHER: None. MRA HEAD FINDINGS- mild motion degraded examination. ANTERIOR CIRCULATION: Normal flow related enhancement of the included cervical, petrous, cavernous and supraclinoid internal carotid arteries. Patent anterior communicating artery. Normal flow related enhancement of the anterior and middle cerebral arteries, including distal segments. On maximum intensity projected reformations, there is apparent stenosis of RIGHT M2 origin though this is seen as flow artifact on the raw data. No large vessel occlusion, high-grade stenosis, abnormal luminal irregularity, aneurysm. POSTERIOR CIRCULATION: Codominant vertebral artery's. Artery is patent, with normal flow related  enhancement of the main branch vessels. Normal flow related enhancement of the posterior cerebral arteries. No large vessel occlusion, high-grade stenosis, abnormal luminal irregularity, aneurysm. ANATOMIC VARIANTS: None. IMPRESSION: MRI HEAD: No acute intracranial process on this motion degraded examination. Stable examination: Greater than 10 supratentorial white matter lesions, some of which are typical of chronic demyelination. No new lesions and no abnormal enhancement. No parenchymal brain volume loss for age. MRA HEAD: No emergent large vessel occlusion or severe stenosis on this motion degraded examination. Electronically Signed   By: Awilda Metro M.D.   On: 12/17/2016 16:57    Procedures Procedures (including critical care time)  Medications Ordered in ED Medications  HYDROmorphone (DILAUDID) injection 1 mg (1 mg Intravenous Given 12/17/16 1310)  ondansetron (ZOFRAN) injection 4 mg (4 mg Intravenous Given 12/17/16 1310)  HYDROmorphone (DILAUDID) injection 1 mg (1 mg Intravenous Given 12/17/16 1539)  gadobenate dimeglumine (MULTIHANCE) injection 18 mL (18 mLs Intravenous Contrast Given 12/17/16 1631)     Initial Impression / Assessment and Plan /  ED Course  I have reviewed the triage vital signs and the nursing notes.  Pertinent labs & imaging results that were available during my care of the patient were reviewed by me and considered in my medical decision making (see chart for details).     Pt has elevated wbc count.  Pt has just finished steroid dose pack.  I doubt meningitis.  Pt reports no relief with any medications.  I discussed with neurology on call  who advised try repeating steroid dose pack and continue oxygen.    Final Clinical Impressions(s) / ED Diagnoses   Final diagnoses:  Intractable cluster headache syndrome, unspecified chronicity pattern    New Prescriptions New Prescriptions   No medications on file  Rn reports on reevaluation after medications.  Pt reported he is suicidal and does not want to live anymore.  I will have TTS  Assess pt.   Berna Spare with TTS advised to hold pt until am to see Psychiatrist  Elson Areas, PA-C 12/17/16 2030    Lonia Skinner [place], [place] 12/17/16 2316    Gerhard Munch, MD 12/18/16 1425

## 2016-12-17 NOTE — ED Triage Notes (Addendum)
Pt c/o headache since 4am; hx of cluster headaches and MS; pt ran out of prescription for Roxicodone; pt uses [place] at 2L at home

## 2016-12-17 NOTE — ED Notes (Signed)
PA requested haldol be held until after TTS consults with patient.

## 2016-12-17 NOTE — BH Assessment (Addendum)
Tele Assessment Note   [name] is an 56 y.o. male.  -Clinician spoke with [name], PA.  She said that patient came in with complaint of migraine.  Patient did tell a nurse that he has been thinking of killing himself.  Patient says that he was in [facility] a week ago for a whole week.  He says that he was being treated for pain.  Patient says he got to feeling better and was discharged.  Patient says that today the pain started coming back.  He was brought to [facility] via ambulance today.  Patient says that he has been having thoughts of killing himself.  He has plan to "shoot himself in the head."  Patient says he can get access to a gun.  Patient has had thoughts of killing himself over the last several months.  He also has some thoughts of harming others but has no intention or plan.  Patient hearing voices telling him to put himself out of his misery.    Patient says he fell three times this past Saturday in the home.  He uses a wheelchair or a cane to get around due to unsteadiness.  Patient says he has cluster headaches that leave him depressed and not wanting to get out of bed.    Patient denies any SA issues.  Patient says that he was in a mental health facility at age 13.  He does not remember the last time he had outpatient services.  -Clinician discussed patient care with [name], PA who recommended an AM psych eval.  Clinician talked to [facility], PA who was in agreement that patient needed to stay at [facility] over night.  Diagnosis: MDD recurrent, severe  Past Medical History:  Past Medical History:  Diagnosis Date  . Abdominal pain, unspecified site   . Anxiety   . Arthritis   . Benign paroxysmal positional vertigo   . Chronic back pain   . Chronic pain syndrome 01/25/2008  . Cluster headache   . Depression    takes Zoloft daily  . DVT (deep venous thrombosis) (HCC)    in the left arm '09  . Gait abnormality    "uses mobile wheelchair, but is  ambulatory"  . Gallstones 02/17/2009   resolved after gallbladder surgery.  [name] GERD (gastroesophageal reflux disease)    takes Omeprazole as needed  . Headache(784.0)    cluster headaches frequently-takes Topamax daily  . History of colonoscopy   . HTN (hypertension)    takes Lisinopril,Verapamil,and Triamterene HCTZ daily  . Insomnia 11/06/2008  . Joint pain   . Joint swelling    03-07-16 "swelling of right wrist" "after a fall-xray done 03-06-16 "no fractures".  . Memory loss    no an issue at present 03-07-16  . Multiple sclerosis (HCC)    Dx. 2005 - Dr. Tinnie Gens follows LOV 4'17 tx. Tysabri monthly IV-[facility] , [place].  [name] Nonspecific elevation of levels of transaminase or lactic acid dehydrogenase (LDH)   . Other specified visual disturbances   . Other syndromes affecting cervical region   . Pneumonia 2009  . Trigeminal neuralgia     history" Multiple sclerosis"    Past Surgical History:  Procedure Laterality Date  . CHOLECYSTECTOMY  02/20/2009  . COLONOSCOPY WITH PROPOFOL N/A 03/17/2016   Procedure: COLONOSCOPY WITH PROPOFOL;  Surgeon: [name], MD;  Location: [facility] ENDOSCOPY;  Service: Endoscopy;  Laterality: N/A;  . PORT A CATH REVISION N/A 07/06/2015   Procedure: Removal  and replacement of PORT A CATH;  Surgeon: [name], MD;  Location: [facility] OR;  Service: General;  Laterality: N/A;  . PORTACATH PLACEMENT N/A 03/27/2014   Procedure: INSERTION PORT-A-CATH;  Surgeon: [name], MD;  Location: [facility] OR;  Service: General;  Laterality: N/A;    Family History:  Family History  Problem Relation Age of Onset  . Cancer Father   . Diabetes Mother     Social History:  reports that he has been smoking Cigarettes.  He has a 38.00 pack-year smoking history. He has never used smokeless tobacco. He reports that he drinks alcohol. He reports that he does not use drugs.  Additional Social History:  Alcohol / Drug Use Pain Medications: Roxycodone; Percocets  prescribed at a [facility] Prescriptions: Zoloft (takes daily) Over the Counter: None History of alcohol / drug use?: No history of alcohol / drug abuse  CIWA: CIWA-Ar BP: 145/86 Pulse Rate: 81 COWS:    PATIENT STRENGTHS: (choose at least two) Ability for insight Average or above average intelligence Communication skills Supportive family/friends  Allergies: No Known Allergies  Home Medications:  (Not in a hospital admission)  OB/GYN Status:  No LMP for male patient.  General Assessment Data Location of Assessment: [facility] ED TTS Assessment: In system Is this a Tele or Face-to-Face Assessment?: Face-to-Face Is this an Initial Assessment or a Re-assessment for this encounter?: Initial Assessment Marital status: Divorced Is patient pregnant?: No Pregnancy Status: No Living Arrangements: Parent (Lives at his mother's house.) Can pt return to current living arrangement?: Yes Admission Status: Voluntary Is patient capable of signing voluntary admission?: Yes Referral Source: Self/Family/Friend (Pt told nurse he did not want to live anymore.) Insurance type: MCR/MCD     Crisis Care Plan Living Arrangements: Parent (Lives at his mother's house.) Name of Psychiatrist: None Name of Therapist: None  Education Status Is patient currently in school?: No Highest grade of school patient has completed: 11th grade then GED  Risk to self with the past 6 months Suicidal Ideation: Yes-Currently Present Has patient been a risk to self within the past 6 months prior to admission? : Yes Suicidal Intent: Yes-Currently Present Has patient had any suicidal intent within the past 6 months prior to admission? : Yes Is patient at risk for suicide?: Yes Suicidal Plan?: Yes-Currently Present Has patient had any suicidal plan within the past 6 months prior to admission? : Yes Specify Current Suicidal Plan: "Shoot myself in the head" Access to Means: Yes Specify Access to Suicidal  Means: Pt says he could get hold of a gun. What has been your use of drugs/alcohol within the last 12 months?: Denies Previous Attempts/Gestures: Yes How many times?: 1 Other Self Harm Risks: None Triggers for Past Attempts: Other (Comment) (Physical pain, chronic pain.) Intentional Self Injurious Behavior: None Family Suicide History: No Recent stressful life event(s): Recent negative physical changes (Whole lot of stress.) Persecutory voices/beliefs?: Yes Depression: Yes Depression Symptoms: Despondent, Loss of interest in usual pleasures, Feeling worthless/self pity, Isolating, Insomnia, Tearfulness Substance abuse history and/or treatment for substance abuse?: No Suicide prevention information given to non-admitted patients: Not applicable  Risk to Others within the past 6 months Homicidal Ideation: No Does patient have any lifetime risk of violence toward others beyond the six months prior to admission? : No Thoughts of Harm to Others: No-Not Currently Present/Within Last 6 Months Current Homicidal Intent: No Current Homicidal Plan: No Access to Homicidal Means: No Identified Victim: "Some people from my past" History of  harm to others?: No Assessment of Violence: None Noted Violent Behavior Description: Pt denies past fights. Does patient have access to weapons?: Yes (Comment) (Says he could get a gun.) Criminal Charges Pending?: No Does patient have a court date: No Is patient on probation?: No  Psychosis Hallucinations: Auditory (Voices telling him to harm self.) Delusions: None noted  Mental Status Report Appearance/Hygiene: Disheveled Eye Contact: Poor Motor Activity: Freedom of movement, Unremarkable Speech: Logical/coherent Level of Consciousness: Quiet/awake Mood: Depressed, Despair, Helpless, Sad, Anxious Affect: Blunted, Depressed Anxiety Level: Panic Attacks Panic attack frequency: 2-3 times per day Most recent panic attack: Yesterday Thought Processes:  Coherent, Relevant Judgement: Unimpaired Orientation: Person, Place, Time, Situation Obsessive Compulsive Thoughts/Behaviors: None  Cognitive Functioning Concentration: Decreased Memory: Recent Impaired, Remote Intact IQ: Average Insight: Fair Impulse Control: Fair Appetite: Fair Weight Loss: 0 Weight Gain: 0 Sleep: Decreased Total Hours of Sleep:  (<6H/D) Vegetative Symptoms: Staying in bed, Decreased grooming  ADLScreening [facility] Assessment Services) Patient's cognitive ability adequate to safely complete daily activities?: Yes Patient able to express need for assistance with ADLs?: Yes Independently performs ADLs?: Yes (appropriate for developmental age)  Prior Inpatient Therapy Prior Inpatient Therapy: Yes Prior Therapy Dates: Age 75 years Prior Therapy Facilty/Provider(s): A facility in [place] Reason for Treatment: depression  Prior Outpatient Therapy Prior Outpatient Therapy: No Prior Therapy Dates: None Prior Therapy Facilty/Provider(s): None Reason for Treatment: N/A Does patient have an ACCT team?: No Does patient have Intensive In-House Services?  : No Does patient have [facility] services? : No Does patient have P4CC services?: No  ADL Screening (condition at time of admission) Patient's cognitive ability adequate to safely complete daily activities?: Yes Is the patient deaf or have difficulty hearing?: No Does the patient have difficulty seeing, even when wearing glasses/contacts?: Yes (Blurry vision from migraines.) Does the patient have difficulty concentrating, remembering, or making decisions?: Yes Patient able to express need for assistance with ADLs?: Yes Does the patient have difficulty dressing or bathing?: No Independently performs ADLs?: Yes (appropriate for developmental age) Does the patient have difficulty walking or climbing stairs?: Yes (Uses a wheelchair and a walker.  Larey Seat three times on 12/13/16.) Weakness of Legs: Right Weakness of Arms/Hands:  None       Abuse/Neglect Assessment (Assessment to be complete while patient is alone) Physical Abuse: Denies Verbal Abuse: Yes, past (Comment) (Past emotional abuse.) Sexual Abuse: Denies Exploitation of patient/patient's resources: Denies Self-Neglect: Denies          Additional Information 1:1 In Past 12 Months?: No CIRT Risk: No Elopement Risk: No Does patient have medical clearance?: Yes     Disposition:  Disposition Initial Assessment Completed for this Encounter: Yes Disposition of Patient: Other dispositions Other disposition(s): Other (Comment) (To be reviewed with PA)  Beatriz Stallion Ray 12/17/2016 10:45 PM

## 2016-12-17 NOTE — ED Notes (Signed)
Upon going into the room to give pt the medications ordered, the patient stated, "I can't live like this. I guess I'm going to kill myself."  With further questioning, the patient stated that he's "been dealing with this for 27 years" and is tired of being in so much pain without relief.  Provider made aware.

## 2016-12-18 DIAGNOSIS — G44001 Cluster headache syndrome, unspecified, intractable: Secondary | ICD-10-CM | POA: Diagnosis not present

## 2016-12-18 DIAGNOSIS — Z79891 Long term (current) use of opiate analgesic: Secondary | ICD-10-CM | POA: Diagnosis not present

## 2016-12-18 DIAGNOSIS — R45851 Suicidal ideations: Secondary | ICD-10-CM

## 2016-12-18 DIAGNOSIS — F17210 Nicotine dependence, cigarettes, uncomplicated: Secondary | ICD-10-CM | POA: Diagnosis not present

## 2016-12-18 DIAGNOSIS — Z809 Family history of malignant neoplasm, unspecified: Secondary | ICD-10-CM

## 2016-12-18 DIAGNOSIS — F339 Major depressive disorder, recurrent, unspecified: Secondary | ICD-10-CM | POA: Diagnosis not present

## 2016-12-18 DIAGNOSIS — Z833 Family history of diabetes mellitus: Secondary | ICD-10-CM

## 2016-12-18 DIAGNOSIS — Z9889 Other specified postprocedural states: Secondary | ICD-10-CM | POA: Diagnosis not present

## 2016-12-18 DIAGNOSIS — Z79899 Other long term (current) drug therapy: Secondary | ICD-10-CM | POA: Diagnosis not present

## 2016-12-18 DIAGNOSIS — Z9049 Acquired absence of other specified parts of digestive tract: Secondary | ICD-10-CM

## 2016-12-18 LAB — URINALYSIS, ROUTINE W REFLEX MICROSCOPIC
BILIRUBIN URINE: NEGATIVE
Glucose, UA: NEGATIVE mg/dL
HGB URINE DIPSTICK: NEGATIVE
KETONES UR: 5 mg/dL — AB
Leukocytes, UA: NEGATIVE
Nitrite: NEGATIVE
PROTEIN: NEGATIVE mg/dL
Specific Gravity, Urine: 1.014 (ref 1.005–1.030)
pH: 6 (ref 5.0–8.0)

## 2016-12-18 LAB — PROTIME-INR
INR: 1.4
Prothrombin Time: 17.2 seconds — ABNORMAL HIGH (ref 11.4–15.2)

## 2016-12-18 MED ORDER — DALFAMPRIDINE ER 10 MG PO TB12: 10.0000 mg | ORAL_TABLET | Freq: Two times a day (BID) | ORAL | Status: DC

## 2016-12-18 MED ORDER — WARFARIN SODIUM 7.5 MG PO TABS
7.5000 mg | ORAL_TABLET | Freq: Once | ORAL | Status: AC
  Administered 2016-12-18: 7.5 mg via ORAL
  Filled 2016-12-18: qty 1

## 2016-12-18 MED ORDER — WARFARIN - PHARMACIST DOSING INPATIENT: Freq: Every day | Status: DC

## 2016-12-18 MED ORDER — OXYCODONE-ACETAMINOPHEN 5-325 MG PO TABS
2.0000 | ORAL_TABLET | Freq: Once | ORAL | Status: AC
  Administered 2016-12-18: 2 via ORAL
  Filled 2016-12-18: qty 2

## 2016-12-18 MED ORDER — TRIAMTERENE-HCTZ 37.5-25 MG PO CAPS
1.0000 | ORAL_CAPSULE | Freq: Every day | ORAL | Status: DC | PRN
Start: 2016-12-18 — End: 2016-12-19
  Filled 2016-12-18: qty 1

## 2016-12-18 MED ORDER — POLYETHYLENE GLYCOL 3350 17 G PO PACK
17.0000 g | PACK | Freq: Every day | ORAL | Status: DC
  Filled 2016-12-18: qty 1

## 2016-12-18 MED ORDER — DEXAMETHASONE SODIUM PHOSPHATE 10 MG/ML IJ SOLN
10.0000 mg | Freq: Once | INTRAMUSCULAR | Status: AC
  Administered 2016-12-18: 10 mg via INTRAVENOUS
  Filled 2016-12-18: qty 1

## 2016-12-18 MED ORDER — PREDNISONE 20 MG PO TABS
20.0000 mg | ORAL_TABLET | Freq: Two times a day (BID) | ORAL | Status: DC
  Administered 2016-12-18 – 2016-12-19 (×2): 20 mg via ORAL
  Filled 2016-12-18 (×2): qty 1

## 2016-12-18 MED ORDER — SERTRALINE HCL 50 MG PO TABS
50.0000 mg | ORAL_TABLET | Freq: Every day | ORAL | Status: DC
  Administered 2016-12-18: 50 mg via ORAL
  Filled 2016-12-18: qty 1

## 2016-12-18 MED ORDER — GABAPENTIN 300 MG PO CAPS
600.0000 mg | ORAL_CAPSULE | Freq: Three times a day (TID) | ORAL | Status: DC
  Administered 2016-12-18 – 2016-12-19 (×4): 600 mg via ORAL
  Filled 2016-12-18 (×4): qty 2

## 2016-12-18 MED ORDER — OXYCODONE-ACETAMINOPHEN 5-325 MG PO TABS
1.0000 | ORAL_TABLET | ORAL | Status: DC | PRN
  Administered 2016-12-18: 2 via ORAL
  Administered 2016-12-18 (×2): 1 via ORAL
  Administered 2016-12-18 – 2016-12-19 (×5): 2 via ORAL
  Filled 2016-12-18 (×2): qty 2
  Filled 2016-12-18: qty 1
  Filled 2016-12-18 (×3): qty 2
  Filled 2016-12-18: qty 1
  Filled 2016-12-18: qty 2

## 2016-12-18 MED ORDER — PANTOPRAZOLE SODIUM 40 MG PO TBEC
40.0000 mg | DELAYED_RELEASE_TABLET | Freq: Every day | ORAL | Status: DC
  Administered 2016-12-18 – 2016-12-19 (×2): 40 mg via ORAL
  Filled 2016-12-18 (×3): qty 1

## 2016-12-18 MED ORDER — WARFARIN SODIUM 5 MG PO TABS
5.0000 mg | ORAL_TABLET | Freq: Every day | ORAL | Status: DC
  Filled 2016-12-18: qty 1

## 2016-12-18 MED ORDER — VERAPAMIL HCL ER 120 MG PO TBCR
120.0000 mg | EXTENDED_RELEASE_TABLET | Freq: Every day | ORAL | Status: DC
  Filled 2016-12-18: qty 1

## 2016-12-18 MED ORDER — SODIUM CHLORIDE 0.9 % IV BOLUS (SEPSIS)
1000.0000 mL | Freq: Once | INTRAVENOUS | Status: AC
  Administered 2016-12-18: 1000 mL via INTRAVENOUS

## 2016-12-18 MED ORDER — LISINOPRIL 10 MG PO TABS
10.0000 mg | ORAL_TABLET | Freq: Every day | ORAL | Status: DC
  Administered 2016-12-18 – 2016-12-19 (×2): 10 mg via ORAL
  Filled 2016-12-18 (×3): qty 1

## 2016-12-18 MED ORDER — LEVETIRACETAM 500 MG PO TABS
500.0000 mg | ORAL_TABLET | Freq: Two times a day (BID) | ORAL | Status: DC
  Administered 2016-12-18 – 2016-12-19 (×3): 500 mg via ORAL
  Filled 2016-12-18 (×3): qty 1

## 2016-12-18 MED ORDER — BACLOFEN 10 MG PO TABS
20.0000 mg | ORAL_TABLET | Freq: Four times a day (QID) | ORAL | Status: DC
  Administered 2016-12-18 – 2016-12-19 (×6): 20 mg via ORAL
  Filled 2016-12-18 (×6): qty 2

## 2016-12-18 MED ORDER — SUMATRIPTAN SUCCINATE 6 MG/0.5ML ~~LOC~~ SOLN
6.0000 mg | Freq: Two times a day (BID) | SUBCUTANEOUS | Status: DC | PRN
  Administered 2016-12-19: 6 mg via SUBCUTANEOUS
  Filled 2016-12-18: qty 0.5

## 2016-12-18 MED ORDER — SUMATRIPTAN SUCCINATE 6 MG/0.5ML ~~LOC~~ SOLN
6.0000 mg | Freq: Once | SUBCUTANEOUS | Status: AC
  Administered 2016-12-18: 6 mg via SUBCUTANEOUS
  Filled 2016-12-18: qty 0.5

## 2016-12-18 MED ORDER — KETOROLAC TROMETHAMINE 30 MG/ML IJ SOLN
30.0000 mg | Freq: Once | INTRAMUSCULAR | Status: AC
  Administered 2016-12-18: 30 mg via INTRAVENOUS
  Filled 2016-12-18: qty 1

## 2016-12-18 MED ORDER — SERTRALINE HCL 50 MG PO TABS
100.0000 mg | ORAL_TABLET | Freq: Every day | ORAL | Status: DC
  Administered 2016-12-19: 100 mg via ORAL
  Filled 2016-12-18 (×2): qty 2

## 2016-12-18 MED ORDER — WARFARIN - PHYSICIAN DOSING INPATIENT: Freq: Every day | Status: DC

## 2016-12-18 MED ORDER — HYDROCHLOROTHIAZIDE 12.5 MG PO CAPS
12.5000 mg | ORAL_CAPSULE | Freq: Every day | ORAL | Status: DC
  Administered 2016-12-18 – 2016-12-19 (×2): 12.5 mg via ORAL
  Filled 2016-12-18 (×2): qty 1

## 2016-12-18 MED ORDER — OXYCODONE HCL ER 10 MG PO T12A
20.0000 mg | EXTENDED_RELEASE_TABLET | Freq: Two times a day (BID) | ORAL | Status: DC
  Administered 2016-12-18 – 2016-12-19 (×3): 20 mg via ORAL
  Filled 2016-12-18 (×3): qty 2

## 2016-12-18 MED ORDER — HYDROCHLOROTHIAZIDE 25 MG PO TABS
25.0000 mg | ORAL_TABLET | Freq: Every day | ORAL | Status: DC
  Administered 2016-12-18 – 2016-12-19 (×2): 25 mg via ORAL
  Filled 2016-12-18 (×3): qty 1

## 2016-12-18 MED ORDER — LISINOPRIL-HYDROCHLOROTHIAZIDE 10-12.5 MG PO TABS: 1.0000 | ORAL_TABLET | Freq: Every day | ORAL | Status: DC

## 2016-12-18 MED ORDER — VALPROATE SODIUM 500 MG/5ML IV SOLN
500.0000 mg | Freq: Once | INTRAVENOUS | Status: AC
  Administered 2016-12-18: 500 mg via INTRAVENOUS
  Filled 2016-12-18: qty 5

## 2016-12-18 NOTE — ED Notes (Signed)
Pt changed into maroon scrubs and wanded by security 

## 2016-12-18 NOTE — ED Notes (Signed)
Pt stated "I need 2 percocets.  I've been taking percocet x 9 years.  They started giving me roxi 20 mg last month."

## 2016-12-18 NOTE — Consult Note (Addendum)
[place] [facility] Consult   Reason for Consult:  Psychiatric Evaluation  Referring Physician:  EDP Patient Identification: [name] MRN:  [id number] Principal Diagnosis: Major depressive disorder, recurrent episode [facility]) Diagnosis:   Patient Active Problem List   Diagnosis Date Noted  . Major depressive disorder, recurrent episode ([place]) [F33.9] 12/19/2016  . Suicidal ideation [R45.851]   . AKI (acute kidney injury) ([place]) [N17.9] 11/15/2016  . Chronic hepatitis C virus infection ([place]) [B18.2] 11/15/2016  . Chronic abdominal pain [R10.9, G89.29] 11/15/2016  . Chronic pain syndrome [G89.4] 11/15/2016  . Acute retention of urine [R33.8] 11/15/2016  . Generalized abdominal pain [R10.84]   . Chronic cluster headache, not intractable [G44.029]   . Community acquired pneumonia [J18.9]   . 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 HCAP (healthcare-associated pneumonia) [J18.9] 02/16/2016  . Hypokalemia [E87.6] 02/16/2016  . Cluster headache [G44.009]   . DVT (deep venous thrombosis) ([place]) [I82.409]   . Depression [F32.9]   . GERD (gastroesophageal reflux disease) [K21.9]   . HTN (hypertension) [I10]   . Essential hypertension [I10]   . Gastroesophageal reflux disease without esophagitis [K21.9]   . CAP (community acquired pneumonia) [J18.9]   . Multiple sclerosis ([place]) [G35] 08/02/2013  . ABDOMINAL BLOATING [R14.3, R14.1, R14.2] 10/14/2010  . LOOSE STOOLS [R19.7] 10/14/2010  . PULMONARY EMBOLISM, HX OF [Z86.718] 10/14/2010    Total Time spent with patient: 30 minutes  Subjective:   [name] is a 56 y.o. male patient who states "I been having bad headaches."  HPI:  Per behavioral health therapeutic triage assessment, Patient says that he was in [facility] a week ago for a whole week.  He says that he was being treated for pain.  Patient says he got to feeling better and was discharged.  Patient says that today the pain started coming back.  He was brought to [facility]  via ambulance today.Patient says that he has been having thoughts of killing himself.  He has plan to "shoot himself in the head."  Patient says he can get access to a gun.  Patient has had thoughts of killing himself over the last several months.  He also has some thoughts of harming others but has no intention or plan.  Patient hearing voices telling him to put himself out of his misery.  Patient says he fell three times this past Saturday in the home.  He uses a wheelchair or a cane to get around due to unsteadiness.  Patient says he has cluster headaches that leave him depressed and not wanting to get out of bed.  Patient denies any SA issues.  Patient says that he was in a mental health facility at age 84.  He does not remember the last time he had outpatient services.  [facility] Evaluation:  Chart and nursing notes reviewed. Face-to-face evaluation completed with Dr. Darleene Cleaver. The patient is alert and oriented x 4. He is calm and cooperative but becomes agitated when he explains the cause of his headaches which he attributes to medication he was on for the treatment of Hepatitis C. 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  He denies alcohol and drug use. He wavers regarding suicidal ideation. He discloses his mean would be via a gun. He denies access to weapons. He endorses auditory hallucinations which "tell me to get it over with, you shouldn't be here suffering." He states auditory hallucinations have been present for a month. He states he has a past history of depression and has been on Zoloft 50 mg for 8 years. He denies homicidal ideation, intent or plan.   Past Psychiatric  History: Depression   Risk to Self: Suicidal Ideation: Yes-Currently Present Suicidal Intent: Yes-Currently Present Is patient at risk for suicide?: Yes Suicidal Plan?: Yes-Currently Present Specify Current Suicidal Plan: "Shoot myself in the head" Access to Means: Yes Specify Access to Suicidal Means: Pt says he could get hold of a gun. What has been your use of drugs/alcohol within the last 12 months?: Denies How many times?: 1 Other Self Harm Risks: None Triggers for Past Attempts: Other (Comment) (Physical pain, chronic pain.) Intentional Self Injurious Behavior: None Risk to Others: Homicidal Ideation: No Thoughts of Harm to Others: No-Not Currently Present/Within Last 6 Months Current Homicidal Intent: No Current Homicidal Plan: No Access to Homicidal Means: No Identified Victim: "Some people from my past" History of harm to others?: No Assessment of Violence: None Noted Violent Behavior Description: Pt denies past fights. Does patient have access to weapons?: Yes (Comment) (Says he could get a gun.) Criminal Charges Pending?: No Does patient have a court date: No Prior Inpatient Therapy: Prior Inpatient Therapy: Yes Prior Therapy Dates: Age 29 years Prior Therapy Facilty/Provider(s): A facility in [place] Reason for Treatment: depression Prior Outpatient Therapy: Prior Outpatient Therapy: No Prior Therapy Dates: None Prior Therapy Facilty/Provider(s): None Reason for Treatment: N/A Does patient have an ACCT team?: No Does patient have Intensive In-House Services?  : No Does patient have [facility] services? : No Does patient have P4CC services?: No  Past Medical History:  Past Medical History:  Diagnosis Date  . Abdominal pain, unspecified site   . Anxiety   . Arthritis   . Benign paroxysmal positional vertigo   . Chronic back pain   . Chronic pain syndrome 01/25/2008  . Cluster headache   . Depression    takes Zoloft daily  . DVT (deep venous thrombosis) (HCC)    in  the left arm '09  . Gait abnormality    "uses mobile wheelchair, but is ambulatory"  . Gallstones 02/17/2009   resolved after gallbladder surgery.  [name] GERD (gastroesophageal reflux disease)    takes Omeprazole as needed  . Headache(784.0)    cluster headaches frequently-takes Topamax daily  . History of colonoscopy   . HTN (hypertension)    takes Lisinopril,Verapamil,and Triamterene HCTZ daily  . Insomnia 11/06/2008  . Joint pain   . Joint swelling    03-07-16 "swelling of right wrist" "after a fall-xray done 03-06-16 "no fractures".  . Memory loss    no an issue at present 03-07-16  . Multiple sclerosis (HCC)    Dx. 2005 - Dr. Tinnie Gens follows LOV 4'17 tx. Tysabri monthly IV-[facility] [place].  [name] Nonspecific elevation of levels of transaminase or lactic acid dehydrogenase (LDH)   . Other specified visual disturbances   . Other syndromes affecting cervical region   . Pneumonia 2009  . Trigeminal neuralgia     history" Multiple sclerosis"    Past Surgical History:  Procedure Laterality Date  . CHOLECYSTECTOMY  02/20/2009  . COLONOSCOPY WITH PROPOFOL N/A 03/17/2016   Procedure: COLONOSCOPY  WITH PROPOFOL;  Surgeon: Laurence Spates, MD;  Location: [facility];  Service: Endoscopy;  Laterality: N/A;  . PORT A CATH REVISION N/A 07/06/2015   Procedure: Removal and replacement of PORT A CATH;  Surgeon: Fanny Skates, MD;  Location: [place];  Service: General;  Laterality: N/A;  . PORTACATH PLACEMENT N/A 03/27/2014   Procedure: INSERTION PORT-A-CATH;  Surgeon: Adin Hector, MD;  Location: [place];  Service: General;  Laterality: N/A;   Family History:  Family History  Problem Relation Age of Onset  . Cancer Father   . Diabetes Mother    Family Psychiatric  History: unknown Social History:  History  Alcohol Use  . 0.0 oz/week    Comment: occasional     History  Drug Use No    Comment: Quit 2011    Social History   Social History  . Marital status: Divorced     Spouse name: N/A  . Number of children: 3  . Years of education: 60 th   Occupational History  .      Disabled   Social History Main Topics  . Smoking status: Current Every Day Smoker    Packs/day: 1.00    Years: 38.00    Types: Cigarettes  . Smokeless tobacco: Never Used     Comment: cutting back  . Alcohol use 0.0 oz/week     Comment: occasional  . Drug use: No     Comment: Quit 2011  . Sexual activity: Not Asked   Other Topics Concern  . None   Social History Narrative   Patient is divorced. Patient is disabled. Because of his MS. Patient has 11 th grade education. 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    Right handed.   Caffeine- One daily.   Additional Social History:    Allergies:  No Known Allergies  Labs:  Results for orders placed or performed during the hospital encounter of 12/17/16 (from the past 48 hour(s))  CBC with Differential/Platelet     Status: Abnormal   Collection Time: 12/17/16  1:04 PM  Result Value Ref Range   WBC 21.0 (H) 4.0 - 10.5 K/uL    Comment: ADJUSTED FOR NUCLEATED RBC'S   RBC 5.07 4.22 - 5.81 MIL/uL   Hemoglobin 13.5 13.0 - 17.0 g/dL   HCT 40.1 39.0 - 52.0 %   MCV 79.1 78.0 - 100.0 fL   MCH 26.6 26.0 - 34.0 pg   MCHC 33.7 30.0 - 36.0 g/dL   RDW 15.0 11.5 - 15.5 %   Platelets 352 150 - 400 K/uL   Neutrophils Relative % 44 %   Lymphocytes Relative 45 %   Monocytes Relative 7 %   Eosinophils Relative 3 %   Basophils Relative 1 %   nRBC 1 (H) 0 /100 WBC   Neutro Abs 9.2 (H) 1.7 - 7.7 K/uL   Lymphs Abs 9.5 (H) 0.7 - 4.0 K/uL   Monocytes Absolute 1.5 (H) 0.1 - 1.0 K/uL   Eosinophils Absolute 0.6 0.0 - 0.7 K/uL   Basophils Absolute 0.2 (H) 0.0 - 0.1 K/uL   RBC Morphology RARE NRBCs    WBC Morphology      MODERATE LEFT SHIFT (>5% METAS AND MYELOS,OCC PRO NOTED)    Comment: VACUOLATED NEUTROPHILS  Comprehensive metabolic panel     Status: Abnormal   Collection Time: 12/17/16  1:04 PM  Result  Value Ref Range   Sodium 129 (L) 135 - 145 mmol/L  Potassium 4.4 3.5 - 5.1 mmol/L   Chloride 94 (L) 101 - 111 mmol/L   CO2 29 22 - 32 mmol/L   Glucose, Bld 104 (H) 65 - 99 mg/dL   BUN 16 6 - 20 mg/dL   Creatinine, Ser 0.77 0.61 - 1.24 mg/dL   Calcium 8.8 (L) 8.9 - 10.3 mg/dL   Total Protein 7.2 6.5 - 8.1 g/dL   Albumin 3.8 3.5 - 5.0 g/dL   AST 27 15 - 41 U/L   ALT 141 (H) 17 - 63 U/L   Alkaline Phosphatase 99 38 - 126 U/L   Total Bilirubin 0.4 0.3 - 1.2 mg/dL   GFR calc non Af Amer >60 >60 mL/min   GFR calc Af Amer >60 >60 mL/min    Comment: (NOTE) The eGFR has been calculated using the CKD EPI equation. This calculation has not been validated in all clinical situations. eGFR's persistently <60 mL/min signify possible Chronic Kidney Disease.    Anion gap 6 5 - 15  Protime-INR     Status: Abnormal   Collection Time: 12/17/16  1:04 PM  Result Value Ref Range   Prothrombin Time 22.7 (H) 11.4 - 15.2 seconds   INR 1.97   Rapid urine drug screen (hospital performed)     Status: Abnormal   Collection Time: 12/17/16  8:16 PM  Result Value Ref Range   Opiates POSITIVE (A) NONE DETECTED   Cocaine NONE DETECTED NONE DETECTED   Benzodiazepines NONE DETECTED NONE DETECTED   Amphetamines NONE DETECTED NONE DETECTED   Tetrahydrocannabinol NONE DETECTED NONE DETECTED   Barbiturates NONE DETECTED NONE DETECTED    Comment:        DRUG SCREEN FOR MEDICAL PURPOSES ONLY.  IF CONFIRMATION IS NEEDED FOR ANY PURPOSE, NOTIFY LAB WITHIN 5 DAYS.        LOWEST DETECTABLE LIMITS FOR URINE DRUG SCREEN Drug Class       Cutoff (ng/mL) Amphetamine      1000 Barbiturate      200 Benzodiazepine   532 Tricyclics       992 Opiates          300 Cocaine          300 THC              50   Ethanol     Status: None   Collection Time: 12/17/16 10:03 PM  Result Value Ref Range   Alcohol, Ethyl (B) <5 <5 mg/dL    Comment:        LOWEST DETECTABLE LIMIT FOR SERUM ALCOHOL IS 5 mg/dL FOR MEDICAL  PURPOSES ONLY   Protime-INR     Status: Abnormal   Collection Time: 12/18/16 12:46 PM  Result Value Ref Range   Prothrombin Time 17.2 (H) 11.4 - 15.2 seconds   INR 1.40   Urinalysis, Routine w reflex microscopic     Status: Abnormal   Collection Time: 12/18/16  3:30 PM  Result Value Ref Range   Color, Urine YELLOW YELLOW   APPearance CLEAR CLEAR   Specific Gravity, Urine 1.014 1.005 - 1.030   pH 6.0 5.0 - 8.0   Glucose, UA NEGATIVE NEGATIVE mg/dL   Hgb urine dipstick NEGATIVE NEGATIVE   Bilirubin Urine NEGATIVE NEGATIVE   Ketones, ur 5 (A) NEGATIVE mg/dL   Protein, ur NEGATIVE NEGATIVE mg/dL   Nitrite NEGATIVE NEGATIVE   Leukocytes, UA NEGATIVE NEGATIVE  Protime-INR     Status: Abnormal   Collection Time: 12/19/16  5:40 AM  Result  Value Ref Range   Prothrombin Time 16.6 (H) 11.4 - 15.2 seconds   INR 1.33     Current Facility-Administered Medications  Medication Dose Route Frequency Provider Last Rate Last Dose  . acetaminophen (TYLENOL) tablet 650 mg  650 mg Oral Q4H PRN Fransico Meadow, PA-C      . alum & mag hydroxide-simeth (MAALOX/MYLANTA) 200-200-20 MG/5ML suspension 30 mL  30 mL Oral PRN Fransico Meadow, PA-C      . baclofen (LIORESAL) tablet 20 mg  20 mg Oral QID Tanna Furry, MD   20 mg at 12/18/16 2250  . dalfampridine TB12 10 mg  10 mg Oral BID Tanna Furry, MD   Stopped at 12/18/16 1344  . gabapentin (NEURONTIN) capsule 600 mg  600 mg Oral TID Tanna Furry, MD   600 mg at 12/18/16 2250  . hydrochlorothiazide (HYDRODIURIL) tablet 25 mg  25 mg Oral Daily Tanna Furry, MD   25 mg at 12/18/16 1342  . lisinopril (PRINIVIL,ZESTRIL) tablet 10 mg  10 mg Oral Daily Tanna Furry, MD   10 mg at 12/18/16 1342   And  . hydrochlorothiazide (MICROZIDE) capsule 12.5 mg  12.5 mg Oral Daily Tanna Furry, MD   12.5 mg at 12/18/16 1342  . levETIRAcetam (KEPPRA) tablet 500 mg  500 mg Oral BID Tanna Furry, MD   500 mg at 12/18/16 2250  . LORazepam (ATIVAN) tablet 1 mg  1 mg Oral Q8H PRN Fransico Meadow,  PA-C      . ondansetron [facility]) tablet 4 mg  4 mg Oral Q8H PRN Fransico Meadow, PA-C      . oxyCODONE (OXYCONTIN) 12 hr tablet 20 mg  20 mg Oral Q12H Tanna Furry, MD   20 mg at 12/18/16 2250  . oxyCODONE-acetaminophen (PERCOCET/ROXICET) 5-325 MG per tablet 1-2 tablet  1-2 tablet Oral Q4H PRN Clayton Bibles, PA-C   2 tablet at 12/19/16 0739  . pantoprazole (PROTONIX) EC tablet 40 mg  40 mg Oral Daily Tanna Furry, MD   40 mg at 12/18/16 1014  . polyethylene glycol (MIRALAX / GLYCOLAX) packet 17 g  17 g Oral Daily Tanna Furry, MD      . predniSONE (DELTASONE) tablet 20 mg  20 mg Oral BID WC Tanna Furry, MD   20 mg at 12/19/16 0740  . sertraline (ZOLOFT) tablet 100 mg  100 mg Oral Daily Glendine Swetz, MD      . SUMAtriptan (IMITREX) injection 6 mg  6 mg Subcutaneous Q12H PRN Tanna Furry, MD   6 mg at 12/19/16 0256  . triamterene-hydrochlorothiazide (DYAZIDE) 37.5-25 MG per capsule 1 capsule  1 capsule Oral Daily PRN Tanna Furry, MD      . verapamil (CALAN-SR) CR tablet 120 mg  120 mg Oral QHS Orlie Dakin, MD      . Warfarin - Pharmacist Dosing Inpatient   Does not apply q1800 Luiz Ochoa, [facility]      . zolpidem (AMBIEN) tablet 5 mg  5 mg Oral QHS PRN Fransico Meadow, PA-C       Current Outpatient Prescriptions  Medication Sig Dispense Refill  . baclofen (LIORESAL) 20 MG tablet Take 20 mg by mouth 4 (four) times daily.     [name] dalfampridine (AMPYRA) 10 MG TB12 Take 10 mg by mouth 2 (two) times daily.    . Eszopiclone (ESZOPICLONE) 3 MG TABS Take 3 mg by mouth at bedtime as needed (for sleep).     . gabapentin (NEURONTIN) 600 MG tablet Take 600  mg by mouth 3 (three) times daily.     . hydrochlorothiazide (HYDRODIURIL) 25 MG tablet Take 25 mg by mouth daily.  5  . levETIRAcetam (KEPPRA) 500 MG tablet Take 500 mg by mouth 2 (two) times daily.  5  . lisinopril-hydrochlorothiazide (PRINZIDE,ZESTORETIC) 10-12.5 MG tablet Take 1 tablet by mouth daily.  5  . omeprazole (PRILOSEC) 40 MG capsule Take 40 mg by mouth  daily.  4  . oxyCODONE-acetaminophen (PERCOCET) 10-325 MG tablet Take 1 tablet by mouth every 4 (four) hours as needed for pain.     . polyethylene glycol (MIRALAX / GLYCOLAX) packet Take 17 g by mouth daily. 14 each 0  . predniSONE (DELTASONE) 20 MG tablet Take by mouth as directed. Take 4 tabs daily x 5 days, take 3 tabs daily x 2 days, take 2 tabs daily x 2 days, 1 tab daily x 2 days, one-half tablet daily x 2 days, then STOP.  0  . sertraline (ZOLOFT) 50 MG tablet Take 50 mg by mouth daily.     . SUMAtriptan (IMITREX) 6 MG/0.5ML SOLN injection Inject 0.5 mLs (6 mg total) into the skin every 12 (twelve) hours as needed for migraine or headache. May repeat in 2 hours if headache persists or recurs. 6 vial 0  . triamterene-hydrochlorothiazide (DYAZIDE) 37.5-25 MG capsule Take 1 capsule by mouth daily as needed (blood pressure/edema).   5  . verapamil (CALAN-SR) 120 MG CR tablet Take 1 tablet (120 mg total) by mouth at bedtime. 30 tablet 0  . warfarin (COUMADIN) 5 MG tablet Take 5 mg by mouth daily.    [name] levofloxacin (LEVAQUIN) 500 MG tablet Take 1 tablet (500 mg total) by mouth daily. (Patient not taking: Reported on 12/17/2016) 7 tablet 0  . predniSONE (DELTASONE) 10 MG tablet Take 2 tablets (20 mg total) by mouth daily. (Patient not taking: Reported on 12/17/2016) 15 tablet 0  . promethazine (PHENERGAN) 25 MG tablet Take 1 tablet (25 mg total) by mouth every 6 (six) hours as needed (headache). (Patient not taking: Reported on 12/01/2016) 10 tablet 0  . senna-docusate (SENOKOT-S) 8.6-50 MG tablet Take 1 tablet by mouth 2 (two) times daily. (Patient not taking: Reported on 12/01/2016)    . Sofosbuvir-Velpatasvir (EPCLUSA) 400-100 MG TABS Take 1 tablet by mouth daily. (Patient not taking: Reported on 11/28/2016) 30 tablet 2   Facility-Administered Medications Ordered in Other Encounters  Medication Dose Route Frequency Provider Last Rate Last Dose  . 0.9 %  sodium chloride infusion   Intravenous Once  Lloyd Huger, MD      . acetaminophen (TYLENOL) tablet 1,000 mg  1,000 mg Oral Once Lloyd Huger, MD      . natalizumab (TYSABRI) 300 mg in sodium chloride 0.9 % 100 mL IVPB  300 mg Intravenous Once Lloyd Huger, MD      . sodium chloride 0.9 % injection 10 mL  10 mL Intracatheter PRN Evlyn Kanner, NP   10 mL at 11/07/15 1012    Musculoskeletal: Strength & Muscle Tone: uanble to assess; pt in bed during evaluation  Gait & Station: uanble to assess; pt in bed during evaluation  Patient leans: uanble to assess; pt in bed during evaluation   Psychiatric Specialty Exam: Physical Exam  Nursing note and vitals reviewed. Constitutional: He is oriented to person, place, and time.  Neurological: He is alert and oriented to person, place, and time.  Skin: Skin is warm and dry.    ROS  Blood pressure  104/66, pulse 65, temperature 98.2 F (36.8 C), temperature source Oral, resp. rate 16, height '5\' 9"'$  (1.753 m), weight 82.1 kg (181 lb), SpO2 95 %.Body mass index is 26.73 kg/m.  General Appearance: Fairly Groomed  Eye Contact:  Good  Speech:  Clear and Coherent and Normal Rate  Volume:  Normal  Mood:  Anxious and Irritable  Affect:  Congruent  Thought Process:  Coherent and Descriptions of Associations: Intact  Orientation:  Full (Time, Place, and Person)  Thought Content:  Logical  Suicidal Thoughts:  Yes.  with intent/plan  Homicidal Thoughts:  No  Memory:  Immediate;   Good Recent;   Good  Judgement:  Fair  Insight:  Fair  Psychomotor Activity:  Normal  Concentration:  Concentration: Good and Attention Span: Good  Recall:  Good  Fund of Knowledge:  Good  Language:  Good  Akathisia:  No  Handed:  Right  AIMS (if indicated):     Assets:  Communication Skills Desire for Improvement Financial Resources/Insurance Housing Resilience  ADL's:  Intact  Cognition:  WNL  Sleep:       Case discussed with Dr. Darleene Cleaver; recommendations are: Treatment Plan  Summary: Daily contact with patient to assess and evaluate symptoms and progress in treatment and Medication management  In addition to home medications for medical conditions,  Increase Zoloft to 100 mg daily for depression   Disposition: Recommend psychiatric Inpatient admission when medically cleared.  Serena Colonel, PMHNP-BC, FNP-BC [place] 12/19/2016 8:50 AM  Patient seen face-to-face for psychiatric evaluation, chart reviewed and case discussed with the physician extender and developed treatment plan. Reviewed the information documented and agree with the treatment plan. Corena Pilgrim, MD

## 2016-12-18 NOTE — ED Notes (Signed)
Pt and sitter have been notified about Urinalysis

## 2016-12-18 NOTE — Progress Notes (Signed)
ANTICOAGULATION CONSULT NOTE - Initial Consult  Pharmacy Consult for Warfarin Indication: History of DVT  No Known Allergies  Patient Measurements:  Height: 5'9" Weight (as of 12/03/16): 88.5kg  Vital Signs: Temp: 97.5 F (36.4 C) (02/08 1040) Temp Source: Oral (02/08 1040) BP: 114/66 (02/08 1040) Pulse Rate: 86 (02/08 1040)  Labs:  Recent Labs  12/17/16 1304 12/18/16 1246  HGB 13.5  --   HCT 40.1  --   PLT 352  --   LABPROT 22.7* 17.2*  INR 1.97 1.40  CREATININE 0.77  --     CrCl cannot be calculated (Unknown ideal weight.).   Medical History: Past Medical History:  Diagnosis Date  . Abdominal pain, unspecified site   . Anxiety   . Arthritis   . Benign paroxysmal positional vertigo   . Chronic back pain   . Chronic pain syndrome 01/25/2008  . Cluster headache   . Depression    takes Zoloft daily  . DVT (deep venous thrombosis) (HCC)    in the left arm '09  . Gait abnormality    "uses mobile wheelchair, but is ambulatory"  . Gallstones 02/17/2009   resolved after gallbladder surgery.  [name] GERD (gastroesophageal reflux disease)    takes Omeprazole as needed  . Headache(784.0)    cluster headaches frequently-takes Topamax daily  . History of colonoscopy   . HTN (hypertension)    takes Lisinopril,Verapamil,and Triamterene HCTZ daily  . Insomnia 11/06/2008  . Joint pain   . Joint swelling    03-07-16 "swelling of right wrist" "after a fall-xray done 03-06-16 "no fractures".  . Memory loss    no an issue at present 03-07-16  . Multiple sclerosis (HCC)    Dx. 2005 - Dr. Tinnie Gens follows LOV 4'17 tx. Tysabri monthly IV-[facility] [place].  [name] Nonspecific elevation of levels of transaminase or lactic acid dehydrogenase (LDH)   . Other specified visual disturbances   . Other syndromes affecting cervical region   . Pneumonia 2009  . Trigeminal neuralgia     history" Multiple sclerosis"    Assessment: 41 y/oM with PMH of MS, anxiety,  depression, cluster headache, DVT (2009) on chronic warfarin therapy at home who presented to [facility] ED on 12/17/16 with worsening headache and suicidal ideation. Pharmacy consulted to dose warfarin while patient in the hospital.    Reported home dose of warfarin 5mg  daily with last dose on 12/16/16  INR 1.4 today, subtherapeutic  CBC (2/7): Hgb, Pltc WNL  No bleeding issues reported  Drug interactions: prednisone  Goal of Therapy:  INR 2-3 Monitor platelets by anticoagulation protocol: Yes   Plan:  -Give boosted dose of Warfarin 7.5mg  PO x 1 dose today-give dose early at 1500. Subtherapeutic INR likely secondary to missed dose of warfarin.  -Per discussion with Dr. Fayrene Fearing, EDP, no need for bridging at this time.   -Daily PT/INR. -Monitor CBC and for s/sx of bleeding.   Greer Pickerel, PharmD, BCPS Pager: [phone] 12/18/2016 2:20 PM

## 2016-12-18 NOTE — ED Provider Notes (Addendum)
Seen and evaluated. Notes reviewed. Studies reviewed. Patient has no new hyperacute lesions on MRIto suggest acute MS flare. Does have signs on MRI of MS-chronic. No sign of hemorrhage. Has leukocytosis, likely from recent steroid use. No other lab concerns. States he is suicidal because of his headache that is not resolving. We'll give additional fluids, steroids, Toradol, Depakote and await TTS evaluation.   Rolland Porter, MD 12/18/16 8280    Rolland Porter, MD 12/18/16 0349  14:10:  Call from pharmacy. Patient's INR has dropped. He is on this for DVT in 2009. No requirement to bridge with Lovenox. We'll give the higher dose today and pharmacy will continue dosing.    Rolland Porter, MD 12/18/16 1410

## 2016-12-18 NOTE — ED Notes (Signed)
Pt stating "I need more fluid because my eyes are watering and the water's coming down from my brain."  Informed pt will speak to EDP r/t request.  Pt verbalized understanding.

## 2016-12-18 NOTE — ED Notes (Signed)
Pt assisted to restroom.  

## 2016-12-18 NOTE — ED Notes (Signed)
Bed: ZO10 Expected date:  Expected time:  Means of arrival:  Comments: rm 20

## 2016-12-18 NOTE — ED Notes (Signed)
Psychiatrist and NP at bedside. 

## 2016-12-18 NOTE — BH Assessment (Addendum)
[facility] Assessment Progress Note  Per Mojeed Akintayo, MD, this pt requires psychiatric hospitalization at this time.  The following facilities have been contacted to seek placement for this pt, with results as noted:  Beds available, information sent, decision pending:  [facility]   Declined  [facility] (due to medical acuity)   At capacity:  [facility]   Rhone Ozaki, MA Triage Specialist [phone]     

## 2016-12-18 NOTE — Progress Notes (Signed)
Monya called and said that [facility] can not accept the pt at this time.    Dorothe Pea. Alvey Brockel, Theresia Majors, LCAS Clinical Social Worker Ph: [phone]

## 2016-12-19 DIAGNOSIS — F331 Major depressive disorder, recurrent, moderate: Secondary | ICD-10-CM | POA: Diagnosis not present

## 2016-12-19 DIAGNOSIS — Z79899 Other long term (current) drug therapy: Secondary | ICD-10-CM | POA: Diagnosis not present

## 2016-12-19 DIAGNOSIS — G44001 Cluster headache syndrome, unspecified, intractable: Secondary | ICD-10-CM | POA: Diagnosis not present

## 2016-12-19 DIAGNOSIS — Z79891 Long term (current) use of opiate analgesic: Secondary | ICD-10-CM | POA: Diagnosis not present

## 2016-12-19 DIAGNOSIS — Z9889 Other specified postprocedural states: Secondary | ICD-10-CM | POA: Diagnosis not present

## 2016-12-19 DIAGNOSIS — Z833 Family history of diabetes mellitus: Secondary | ICD-10-CM | POA: Diagnosis not present

## 2016-12-19 DIAGNOSIS — R45851 Suicidal ideations: Secondary | ICD-10-CM | POA: Diagnosis not present

## 2016-12-19 DIAGNOSIS — Z809 Family history of malignant neoplasm, unspecified: Secondary | ICD-10-CM | POA: Diagnosis not present

## 2016-12-19 DIAGNOSIS — F339 Major depressive disorder, recurrent, unspecified: Secondary | ICD-10-CM | POA: Diagnosis present

## 2016-12-19 DIAGNOSIS — F17210 Nicotine dependence, cigarettes, uncomplicated: Secondary | ICD-10-CM | POA: Diagnosis not present

## 2016-12-19 LAB — PROTIME-INR
INR: 1.33
Prothrombin Time: 16.6 seconds — ABNORMAL HIGH (ref 11.4–15.2)

## 2016-12-19 LAB — PATHOLOGIST SMEAR REVIEW

## 2016-12-19 MED ORDER — TOPIRAMATE 25 MG PO TABS: 25.0000 mg | ORAL_TABLET | Freq: Two times a day (BID) | ORAL | 0 refills | Status: DC

## 2016-12-19 MED ORDER — HEPARIN SOD (PORK) LOCK FLUSH 100 UNIT/ML IV SOLN
500.0000 [IU] | Freq: Once | INTRAVENOUS | Status: AC
  Administered 2016-12-19: 500 [IU]
  Filled 2016-12-19 (×2): qty 5

## 2016-12-19 MED ORDER — ALBUTEROL SULFATE HFA 108 (90 BASE) MCG/ACT IN AERS: 2.0000 | INHALATION_SPRAY | RESPIRATORY_TRACT | 0 refills | Status: DC | PRN

## 2016-12-19 MED ORDER — DEXTROSE 50 % IV SOLN: 50.0000 mL | Freq: Once | INTRAVENOUS | Status: DC

## 2016-12-19 MED ORDER — WARFARIN SODIUM 7.5 MG PO TABS
7.5000 mg | ORAL_TABLET | Freq: Once | ORAL | Status: AC
  Administered 2016-12-19: 7.5 mg via ORAL
  Filled 2016-12-19: qty 1

## 2016-12-19 NOTE — ED Notes (Signed)
Patient wanted to wash up before discharge.  Independent in doing so.  Wheeled patient to family member's car.  Ambulatory and independent with walker at discharge.

## 2016-12-19 NOTE — ED Notes (Signed)
Pt provided walker d/t pt stating "I use a walker @ home."

## 2016-12-19 NOTE — ED Notes (Signed)
Pt is requesting 2 percocets, advised he has to wait another 35 minutes, pt voiced understanding

## 2016-12-19 NOTE — ED Provider Notes (Signed)
The patient's nurse asked me to see the patient at his request for evaluation of ongoing headache. He is currently receiving Percocet for the headache. Earlier he had reported suicidal ideation with plan to shoot himself a gun. He does not have a weapon. At this time, he states that he is no longer suicidal. He states that a physician told him he might get better improvement with headache treatment using Trileptal or Topamax. He states that he saw a neurologist, Dr. Tinnie Gens, 2 weeks ago and was prescribed prednisone. He states that in the past. He saw Dr. Sandria Manly, and that Dr. Imagene Gurney nurse recommended that he see Dr. Epimenio Foot.  There are no recent notes from neurology in the computer.   At this time( 0900), the patient is alert, cooperative and interactive. Patient appears to be his baseline. I know this patient having seen him previously.   14:30- . He has been seen by TTS, and psychiatry feel like he is stable for discharge from their perspective. Prescription given for Topamax, as a possible additional treatment for his cluster headache. Recommended PCP follow-up.   Mancel Bale, MD 12/19/16 1430

## 2016-12-19 NOTE — ED Notes (Signed)
Blood sample collected, port flushed

## 2016-12-19 NOTE — ED Notes (Signed)
Pt requesting to see MD about concerns for his headache pain.

## 2016-12-19 NOTE — ED Notes (Signed)
Pt now yelling, "I've got to close the door halfway.  I'm getting shocked from those computers."  Pt informed cannot close the door.

## 2016-12-19 NOTE — BH Assessment (Signed)
BHH Assessment Progress Note  Per Thedore Mins, MD, this pt does not require psychiatric hospitalization at this time, and he has psychiatrically cleared the pt.  Pt will benefit from following up with outpatient psychiatry, and referral information has been included in pt's discharge instructions for pt to pursue at his own initiative.  EDP Mancel Bale, MD has been notified, as well as pt's nurse.  Doylene Canning, MA Triage Specialist [phone]

## 2016-12-19 NOTE — Consult Note (Signed)
[facility] Face-to-Face Psychiatry Consult   Reason for Consult:  Psychiatric Evaluation  Referring Physician:  EDP Patient Identification: [name] MRN:  [id number] Principal Diagnosis: Major depressive disorder, recurrent episode [facility]) Diagnosis:   Patient Active Problem List   Diagnosis Date Noted  . Major depressive disorder, recurrent episode (HCC) [F33.9] 12/19/2016  . Suicidal ideation [R45.851]   . AKI (acute kidney injury) (HCC) [N17.9] 11/15/2016  . Chronic hepatitis C virus infection (HCC) [B18.2] 11/15/2016  . Chronic abdominal pain [R10.9, G89.29] 11/15/2016  . Chronic pain syndrome [G89.4] 11/15/2016  . Acute retention of urine [R33.8] 11/15/2016  . Generalized abdominal pain [R10.84]   . Chronic cluster headache, not intractable [G44.029]   . Community acquired pneumonia [J18.9]   . 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 HCAP (healthcare-associated pneumonia) [J18.9] 02/16/2016  . Hypokalemia [E87.6] 02/16/2016  . Cluster headache [G44.009]   . DVT (deep venous thrombosis) (HCC) [I82.409]   . Depression [F32.9]   . GERD (gastroesophageal reflux disease) [K21.9]   . HTN (hypertension) [I10]   . Essential hypertension [I10]   . Gastroesophageal reflux disease without esophagitis [K21.9]   . CAP (community acquired pneumonia) [J18.9]   . Multiple sclerosis (HCC) [G35] 08/02/2013  . ABDOMINAL BLOATING [R14.3, R14.1, R14.2] 10/14/2010  . LOOSE STOOLS [R19.7] 10/14/2010  . PULMONARY EMBOLISM, HX OF [Z86.718] 10/14/2010    Total Time spent with patient: 30 minutes  Subjective:   [name] is a 56 y.o. male patient who states "I been having bad headaches."  HPI:  Per behavioral health therapeutic triage assessment, Patient says that he was in [facility] a week ago for a whole week.  He says that he was being treated for pain. Patient says he got to feeling better and was discharged.  Patient headache returned and  was brought to [facility] via ambulance today.  Patient  reports today that he is not suicidal.  He was only suicidal because he "had a terrible migraine, cluster headache."   [facility] Evaluation:  Chart and nursing notes reviewed. Face-to-face evaluation completed with Dr. Jannifer Franklin. The patient is alert and oriented x 4. He is calm and cooperative.  He states he is better that he was getting help for his migraine headache.  Patient states that he will consult with Neurology post discharge and be prescribed maintenance medications to help treat the migraines.  He stated he has migraine cluster headaches and sometimes the pain gets so bad "I just want to kill myself."  He denies homicidal ideation, intent or plan.   Past Psychiatric History: Depression   Risk to Self: Suicidal Ideation:Denies Suicidal Intent:denies Is patient at risk for suicide?:denies Suicidal Plan?: none reported Specify Current Suicidal Plan: none Access to Means: denies Specify Access to Suicidal Means:  What has been your use of drugs/alcohol within the last 12 months?: Denies How many times?: 1 Other Self Harm Risks: None Triggers for Past Attempts: Other (Comment) (Physical pain, chronic pain.) Intentional Self Injurious Behavior: None Risk to Others: Homicidal Ideation: No Thoughts of Harm to Others: No-Not Currently Present/Within Last 6 Months Current Homicidal Intent: No Current Homicidal Plan: No Access to Homicidal Means: No Identified Victim: "Some people from my past" History of harm to others?: No Assessment of Violence: None Noted Violent Behavior Description: Pt denies past fights. Does patient have access to weapons?: Yes (Comment) (Says he could get a gun.) Criminal Charges Pending?: No Does patient have a court date: No Prior Inpatient Therapy: Prior Inpatient Therapy: Yes Prior Therapy  Dates: Age 61 years Prior Therapy Facilty/Provider(s): A facility in [place] Reason for Treatment: depression Prior Outpatient Therapy: Prior Outpatient Therapy: No Prior  Therapy Dates: None Prior Therapy Facilty/Provider(s): None Reason for Treatment: N/A Does patient have an ACCT team?: No Does patient have Intensive In-House Services?  : No Does patient have [facility] services? : No Does patient have P4CC services?: No  Past Medical History:  Past Medical History:  Diagnosis Date  . Abdominal pain, unspecified site   . Anxiety   . Arthritis   . Benign paroxysmal positional vertigo   . Chronic back pain   . Chronic pain syndrome 01/25/2008  . Cluster headache   . Depression    takes Zoloft daily  . DVT (deep venous thrombosis) (HCC)    in the left arm '09  . Gait abnormality    "uses mobile wheelchair, but is ambulatory"  . Gallstones 02/17/2009   resolved after gallbladder surgery.  [name] GERD (gastroesophageal reflux disease)    takes Omeprazole as needed  . Headache(784.0)    cluster headaches frequently-takes Topamax daily  . History of colonoscopy   . HTN (hypertension)    takes Lisinopril,Verapamil,and Triamterene HCTZ daily  . Insomnia 11/06/2008  . Joint pain   . Joint swelling    03-07-16 "swelling of right wrist" "after a fall-xray done 03-06-16 "no fractures".  . Memory loss    no an issue at present 03-07-16  . Multiple sclerosis (HCC)    Dx. 2005 - Dr. Tinnie Gens follows LOV 4'17 tx. Tysabri monthly IV-[facility] [place].  [name] Nonspecific elevation of levels of transaminase or lactic acid dehydrogenase (LDH)   . Other specified visual disturbances   . Other syndromes affecting cervical region   . Pneumonia 2009  . Trigeminal neuralgia     history" Multiple sclerosis"    Past Surgical History:  Procedure Laterality Date  . CHOLECYSTECTOMY  02/20/2009  . COLONOSCOPY WITH PROPOFOL N/A 03/17/2016   Procedure: COLONOSCOPY WITH PROPOFOL;  Surgeon: [name], MD;  Location: [facility] END[facility]COPY;  Service: Endoscopy;  Laterality: N/A;  . PORT A CATH REVISION N/A 07/06/2015   Procedure: Removal and replacement of PORT A CATH;   Surgeon: [name], MD;  Location: [facility] OR;  Service: General;  Laterality: N/A;  . PORTACATH PLACEMENT N/A 03/27/2014   Procedure: INSERTION PORT-A-CATH;  Surgeon: [name], MD;  Location: [facility] OR;  Service: General;  Laterality: N/A;   Family History:  Family History  Problem Relation Age of Onset  . Cancer Father   . Diabetes Mother    Family Psychiatric  History: unknown Social History:  History  Alcohol Use  . 0.0 oz/week    Comment: occasional     History  Drug Use No    Comment: Quit 2011    Social History   Social History  . Marital status: Divorced    Spouse name: N/A  . Number of children: 3  . Years of education: 30 th   Occupational History  .      Disabled   Social History Main Topics  . Smoking status: Current Every Day Smoker    Packs/day: 1.00    Years: 38.00    Types: Cigarettes  . Smokeless tobacco: Never Used     Comment: cutting back  . Alcohol use 0.0 oz/week     Comment: occasional  . Drug use: No     Comment: Quit 2011  . Sexual activity: Not Asked   Other Topics Concern  .  None   Social History Narrative   Patient is divorced. Patient is disabled. Because of his MS. Patient has 11 th grade education. 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    Right handed.   Caffeine- One daily.   Additional Social History:    Allergies:  No Known Allergies  Labs:  Results for orders placed or performed during the hospital encounter of 12/17/16 (from the past 48 hour(s))  Rapid urine drug screen (hospital performed)     Status: Abnormal   Collection Time: 12/17/16  8:16 PM  Result Value Ref Range   Opiates POSITIVE (A) NONE DETECTED   Cocaine NONE DETECTED NONE DETECTED   Benzodiazepines NONE DETECTED NONE DETECTED   Amphetamines NONE DETECTED NONE DETECTED   Tetrahydrocannabinol NONE DETECTED NONE DETECTED   Barbiturates NONE DETECTED NONE DETECTED    Comment:        DRUG SCREEN FOR MEDICAL  PURPOSES ONLY.  IF CONFIRMATION IS NEEDED FOR ANY PURPOSE, NOTIFY LAB WITHIN 5 DAYS.        LOWEST DETECTABLE LIMITS FOR URINE DRUG SCREEN Drug Class       Cutoff (ng/mL) Amphetamine      1000 Barbiturate      200 Benzodiazepine   200 Tricyclics       300 Opiates          300 Cocaine          300 THC              50   Ethanol     Status: None   Collection Time: 12/17/16 10:03 PM  Result Value Ref Range   Alcohol, Ethyl (B) <5 <5 mg/dL    Comment:        LOWEST DETECTABLE LIMIT FOR SERUM ALCOHOL IS 5 mg/dL FOR MEDICAL PURPOSES ONLY   Protime-INR     Status: Abnormal   Collection Time: 12/18/16 12:46 PM  Result Value Ref Range   Prothrombin Time 17.2 (H) 11.4 - 15.2 seconds   INR 1.40   Urinalysis, Routine w reflex microscopic     Status: Abnormal   Collection Time: 12/18/16  3:30 PM  Result Value Ref Range   Color, Urine YELLOW YELLOW   APPearance CLEAR CLEAR   Specific Gravity, Urine 1.014 1.005 - 1.030   pH 6.0 5.0 - 8.0   Glucose, UA NEGATIVE NEGATIVE mg/dL   Hgb urine dipstick NEGATIVE NEGATIVE   Bilirubin Urine NEGATIVE NEGATIVE   Ketones, ur 5 (A) NEGATIVE mg/dL   Protein, ur NEGATIVE NEGATIVE mg/dL   Nitrite NEGATIVE NEGATIVE   Leukocytes, UA NEGATIVE NEGATIVE  Protime-INR     Status: Abnormal   Collection Time: 12/19/16  5:40 AM  Result Value Ref Range   Prothrombin Time 16.6 (H) 11.4 - 15.2 seconds   INR 1.33     Current Facility-Administered Medications  Medication Dose Route Frequency Provider Last Rate Last Dose  . acetaminophen (TYLENOL) tablet 650 mg  650 mg Oral Q4H PRN Elson Areas, PA-C      . alum & mag hydroxide-simeth (MAALOX/MYLANTA) 200-200-20 MG/5ML suspension 30 mL  30 mL Oral PRN Elson Areas, PA-C      . baclofen (LIORESAL) tablet 20 mg  20 mg Oral QID Rolland Porter, MD   20 mg at 12/19/16 0951  . dalfampridine TB12 10 mg  10 mg Oral BID Rolland Porter, MD   Stopped at 12/18/16 1344  . gabapentin (NEURONTIN) capsule 600 mg  600 mg Oral  TID Rolland Porter, MD   600 mg at 12/19/16 1610  . hydrochlorothiazide (HYDRODIURIL) tablet 25 mg  25 mg Oral Daily Rolland Porter, MD   25 mg at 12/19/16 0951  . lisinopril (PRINIVIL,ZESTRIL) tablet 10 mg  10 mg Oral Daily Rolland Porter, MD   10 mg at 12/19/16 0957   And  . hydrochlorothiazide (MICROZIDE) capsule 12.5 mg  12.5 mg Oral Daily Rolland Porter, MD   12.5 mg at 12/19/16 9604  . levETIRAcetam (KEPPRA) tablet 500 mg  500 mg Oral BID Rolland Porter, MD   500 mg at 12/19/16 5409  . LORazepam (ATIVAN) tablet 1 mg  1 mg Oral Q8H PRN Elson Areas, PA-C      . ondansetron [facility]) tablet 4 mg  4 mg Oral Q8H PRN Elson Areas, PA-C      . oxyCODONE (OXYCONTIN) 12 hr tablet 20 mg  20 mg Oral Q12H Rolland Porter, MD   20 mg at 12/19/16 0951  . oxyCODONE-acetaminophen (PERCOCET/ROXICET) 5-325 MG per tablet 1-2 tablet  1-2 tablet Oral Q4H PRN Trixie Dredge, PA-C   2 tablet at 12/19/16 1141  . pantoprazole (PROTONIX) EC tablet 40 mg  40 mg Oral Daily Rolland Porter, MD   40 mg at 12/19/16 8119  . polyethylene glycol (MIRALAX / GLYCOLAX) packet 17 g  17 g Oral Daily Rolland Porter, MD      . predniSONE (DELTASONE) tablet 20 mg  20 mg Oral BID WC Rolland Porter, MD   20 mg at 12/19/16 0740  . sertraline (ZOLOFT) tablet 100 mg  100 mg Oral Daily Thedore Mins, MD   100 mg at 12/19/16 0953  . SUMAtriptan (IMITREX) injection 6 mg  6 mg Subcutaneous Q12H PRN Rolland Porter, MD   6 mg at 12/19/16 0256  . triamterene-hydrochlorothiazide (DYAZIDE) 37.5-25 MG per capsule 1 capsule  1 capsule Oral Daily PRN Rolland Porter, MD      . verapamil (CALAN-SR) CR tablet 120 mg  120 mg Oral QHS Doug Sou, MD      . warfarin (COUMADIN) tablet 7.5 mg  7.5 mg Oral Once Mancel Bale, MD      . Warfarin - Pharmacist Dosing Inpatient   Does not apply q1800 Jamse Mead, [facility]      . zolpidem (AMBIEN) tablet 5 mg  5 mg Oral QHS PRN Elson Areas, PA-C       Current Outpatient Prescriptions  Medication Sig Dispense Refill  . baclofen (LIORESAL) 20 MG  tablet Take 20 mg by mouth 4 (four) times daily.     [name] dalfampridine (AMPYRA) 10 MG TB12 Take 10 mg by mouth 2 (two) times daily.    . Eszopiclone (ESZOPICLONE) 3 MG TABS Take 3 mg by mouth at bedtime as needed (for sleep).     . gabapentin (NEURONTIN) 600 MG tablet Take 600 mg by mouth 3 (three) times daily.     . hydrochlorothiazide (HYDRODIURIL) 25 MG tablet Take 25 mg by mouth daily.  5  . levETIRAcetam (KEPPRA) 500 MG tablet Take 500 mg by mouth 2 (two) times daily.  5  . lisinopril-hydrochlorothiazide (PRINZIDE,ZESTORETIC) 10-12.5 MG tablet Take 1 tablet by mouth daily.  5  . omeprazole (PRILOSEC) 40 MG capsule Take 40 mg by mouth daily.  4  . oxyCODONE-acetaminophen (PERCOCET) 10-325 MG tablet Take 1 tablet by mouth every 4 (four) hours as needed for pain.     . polyethylene glycol (MIRALAX / GLYCOLAX) packet Take 17 g  by mouth daily. 14 each 0  . predniSONE (DELTASONE) 20 MG tablet Take by mouth as directed. Take 4 tabs daily x 5 days, take 3 tabs daily x 2 days, take 2 tabs daily x 2 days, 1 tab daily x 2 days, one-half tablet daily x 2 days, then STOP.  0  . sertraline (ZOLOFT) 50 MG tablet Take 50 mg by mouth daily.     . SUMAtriptan (IMITREX) 6 MG/0.5ML SOLN injection Inject 0.5 mLs (6 mg total) into the skin every 12 (twelve) hours as needed for migraine or headache. May repeat in 2 hours if headache persists or recurs. 6 vial 0  . triamterene-hydrochlorothiazide (DYAZIDE) 37.5-25 MG capsule Take 1 capsule by mouth daily as needed (blood pressure/edema).   5  . verapamil (CALAN-SR) 120 MG CR tablet Take 1 tablet (120 mg total) by mouth at bedtime. 30 tablet 0  . warfarin (COUMADIN) 5 MG tablet Take 5 mg by mouth daily.    [name] levofloxacin (LEVAQUIN) 500 MG tablet Take 1 tablet (500 mg total) by mouth daily. (Patient not taking: Reported on 12/17/2016) 7 tablet 0  . predniSONE (DELTASONE) 10 MG tablet Take 2 tablets (20 mg total) by mouth daily. (Patient not taking: Reported on 12/17/2016) 15  tablet 0  . promethazine (PHENERGAN) 25 MG tablet Take 1 tablet (25 mg total) by mouth every 6 (six) hours as needed (headache). (Patient not taking: Reported on 12/01/2016) 10 tablet 0  . senna-docusate (SENOKOT-S) 8.6-50 MG tablet Take 1 tablet by mouth 2 (two) times daily. (Patient not taking: Reported on 12/01/2016)    . Sofosbuvir-Velpatasvir (EPCLUSA) 400-100 MG TABS Take 1 tablet by mouth daily. (Patient not taking: Reported on 11/28/2016) 30 tablet 2   Facility-Administered Medications Ordered in Other Encounters  Medication Dose Route Frequency Provider Last Rate Last Dose  . 0.9 %  sodium chloride infusion   Intravenous Once Jeralyn Ruths, MD      . acetaminophen (TYLENOL) tablet 1,000 mg  1,000 mg Oral Once Jeralyn Ruths, MD      . natalizumab (TYSABRI) 300 mg in sodium chloride 0.9 % 100 mL IVPB  300 mg Intravenous Once Jeralyn Ruths, MD      . sodium chloride 0.9 % injection 10 mL  10 mL Intracatheter PRN Loann Quill, NP   10 mL at 11/07/15 1012    Musculoskeletal: Strength & Muscle Tone: uanble to assess; pt in bed during evaluation  Gait & Station: uanble to assess; pt in bed during evaluation  Patient leans: uanble to assess; pt in bed during evaluation   Psychiatric Specialty Exam: Physical Exam  Nursing note and vitals reviewed. Constitutional: He is oriented to person, place, and time.  Neurological: He is alert and oriented to person, place, and time.  Skin: Skin is warm and dry.    ROS  Blood pressure 104/67, pulse 74, temperature 98.4 F (36.9 C), temperature source Oral, resp. rate 14, height 5\' 9"  (1.753 m), weight 82.1 kg (181 lb), SpO2 95 %.Body mass index is 26.73 kg/m.  General Appearance: Fairly Groomed  Eye Contact:  Good  Speech:  Clear and Coherent and Normal Rate  Volume:  Normal  Mood:  Anxious and Irritable  Affect:  Congruent  Thought Process:  Coherent and Descriptions of Associations: Intact  Orientation:  Full (Time, Place,  and Person)  Thought Content:  Logical  Suicidal Thoughts:  Yes.  with intent/plan  Homicidal Thoughts:  No  Memory:  Immediate;  Good Recent;   Good  Judgement:  Fair  Insight:  Fair  Psychomotor Activity:  Normal  Concentration:  Concentration: Good and Attention Span: Good  Recall:  Good  Fund of Knowledge:  Good  Language:  Good  Akathisia:  No  Handed:  Right  AIMS (if indicated):     Assets:  Communication Skills Desire for Improvement Financial Resources/Insurance Housing Resilience  ADL's:  Intact  Cognition:  WNL  Sleep:       Case discussed with Dr. Jannifer Franklin; recommendations are:  Treatment Plan Summary: Daily contact with patient to assess and evaluate symptoms and progress in treatment and Medication management  In addition to home medications for medical conditions,  Increase Zoloft to 100 mg daily for depression  Will DC to self care and follow up with Neurology  Disposition: No evidence of imminent risk to self or others at present.   Patient does not meet criteria for psychiatric inpatient admission. Supportive therapy provided about ongoing stressors. Discussed crisis plan, support from social network, calling 911, coming to the Emergency Department, and calling Suicide Hotline. see above treatment plan  Gaspar Cola [facility] NP-[facility] 12/19/2016 2:20 PM  Patient seen face-to-face for psychiatric evaluation, chart reviewed and case discussed with the physician extender and developed treatment plan. Reviewed the information documented and agree with the treatment plan. Thedore Mins, MD

## 2016-12-19 NOTE — ED Notes (Signed)
Pt requesting "I need 2 more pain pills".  Informed is not time for more until 0312.  Pt offered Imitrex.  Pt accepted offer.  Requested from pharmacy.

## 2016-12-19 NOTE — ED Notes (Addendum)
Pt stated "I don't know why I don't go to [facility] or up on the 6th floor, it's neuro.  I'm here because of my headaches.  All they've done is give me steroids and some fluids.  Ain't nobody treating my h/a's.  That's what I came here for.  I came here with 96 percocet in my pocket, if I was here for pain pills, I have that.  They'll just want to do another MRI and tell me there's nothing new.  They're just taking my insurance money.  I don't understand why [facility] won't take me there.  They've taken me there before."  Pt clarified [facility] as [facility].

## 2016-12-19 NOTE — ED Notes (Signed)
Pt is occasionally groaning, requesting even more pain medicine, stating "I don't understand why am I wasting my money and paying your salary if you won't do anything for my headache." Attempted to explain to pt necessity of following up with his PCP as an outpatient, and scheduling an appointment with neurologist, since this is a chronic issue (pt is well known to this writer from his previous visits with c/o cluster headaches), however he is very argumentative and is insisting he must be admitted to hospital because "there is a dr on every corner).

## 2016-12-19 NOTE — ED Notes (Signed)
Pt is demanding to see the physician because "I can't take this headache", even though he was recently medicated. He was also requesting to be admitted to neuro floor, instead of "being locked up here with drug addicts".Dr Criss Alvine notified.

## 2016-12-19 NOTE — Discharge Instructions (Addendum)
For your ongoing behavioral health needs, you are advised to follow up with an outpatient psychiatrist.  Contact one of the following providers at your earliest opportunity to ask about scheduling an intake appointment:       [facility]      [address]      [place], [place] [address]      [phone]       [facility]      [address]      [place], [place] [address]      [phone]       [facility]      [address], [address]      [place], [place] [address]      [phone]

## 2016-12-19 NOTE — Progress Notes (Signed)
ANTICOAGULATION CONSULT NOTE - Follow-Up Consult  Pharmacy Consult for Warfarin Indication: History of DVT  No Known Allergies  Patient Measurements: Height: 5\' 9"  (175.3 cm) Weight: 181 lb (82.1 kg) IBW/kg (Calculated) : 70.7  Vital Signs: Temp: 98.4 F (36.9 C) (02/09 1204) Temp Source: Oral (02/09 1204) BP: 104/67 (02/09 1204) Pulse Rate: 74 (02/09 1204)  Labs:  Recent Labs  12/17/16 1304 12/18/16 1246 12/19/16 0540  HGB 13.5  --   --   HCT 40.1  --   --   PLT 352  --   --   LABPROT 22.7* 17.2* 16.6*  INR 1.97 1.40 1.33  CREATININE 0.77  --   --     Estimated Creatinine Clearance: 103.1 mL/min (by C-G formula based on SCr of 0.77 mg/dL).   Medical History: Past Medical History:  Diagnosis Date  . Abdominal pain, unspecified site   . Anxiety   . Arthritis   . Benign paroxysmal positional vertigo   . Chronic back pain   . Chronic pain syndrome 01/25/2008  . Cluster headache   . Depression    takes Zoloft daily  . DVT (deep venous thrombosis) (HCC)    in the left arm '09  . Gait abnormality    "uses mobile wheelchair, but is ambulatory"  . Gallstones 02/17/2009   resolved after gallbladder surgery.  [name] GERD (gastroesophageal reflux disease)    takes Omeprazole as needed  . Headache(784.0)    cluster headaches frequently-takes Topamax daily  . History of colonoscopy   . HTN (hypertension)    takes Lisinopril,Verapamil,and Triamterene HCTZ daily  . Insomnia 11/06/2008  . Joint pain   . Joint swelling    03-07-16 "swelling of right wrist" "after a fall-xray done 03-06-16 "no fractures".  . Memory loss    no an issue at present 03-07-16  . Multiple sclerosis (HCC)    Dx. 2005 - Dr. Tinnie Gens follows LOV 4'17 tx. Tysabri monthly IV-[facility] , [place].  [name] Nonspecific elevation of levels of transaminase or lactic acid dehydrogenase (LDH)   . Other specified visual disturbances   . Other syndromes affecting cervical region   . Pneumonia 2009   . Trigeminal neuralgia     history" Multiple sclerosis"    Assessment: 69 y/oM with PMH of MS, anxiety, depression, cluster headache, DVT (2009) on chronic warfarin therapy at home who presented to [facility] ED on 12/17/16 with worsening headache and suicidal ideation. Pharmacy consulted to dose warfarin while patient in the hospital.    Reported home dose of warfarin 5mg  daily with last dose on 12/16/16 (missed dose on 2/7)  INR decreased to 1.33 today after 1 boosted dose of warfarin yesterday   CBC (2/7): Hgb, Pltc WNL  No bleeding issues reported  Drug interactions: prednisone  Goal of Therapy:  INR 2-3 Monitor platelets by anticoagulation protocol: Yes   Plan:  -Repeat boosted dose of Warfarin 7.5mg  PO x 1 dose now.  -Per discussion with Dr. Fayrene Fearing, EDP, on 2/8, no need for bridging at this time.   -Daily PT/INR. -Monitor CBC and for s/sx of bleeding.   Greer Pickerel, PharmD, BCPS Pager: [phone] 12/19/2016 2:08 PM

## 2016-12-24 ENCOUNTER — Ambulatory Visit: Payer: Medicaid Other | Admitting: Neurology

## 2016-12-30 ENCOUNTER — Other Ambulatory Visit: Payer: Self-pay | Admitting: Pharmacist

## 2016-12-30 ENCOUNTER — Other Ambulatory Visit: Payer: Medicare HMO

## 2016-12-30 DIAGNOSIS — B182 Chronic viral hepatitis C: Secondary | ICD-10-CM

## 2017-01-02 ENCOUNTER — Ambulatory Visit: Payer: Medicare HMO

## 2017-01-05 ENCOUNTER — Ambulatory Visit: Payer: Medicaid Other | Admitting: Neurology

## 2017-01-05 LAB — HEPATITIS C RNA QUANTITATIVE
HCV Quantitative Log: 7.25 Log IU/mL — ABNORMAL HIGH
HCV Quantitative: 17600000 IU/mL — ABNORMAL HIGH

## 2017-01-06 ENCOUNTER — Telehealth: Payer: Self-pay | Admitting: *Deleted

## 2017-01-06 ENCOUNTER — Inpatient Hospital Stay: Payer: Medicare HMO

## 2017-01-06 NOTE — Telephone Encounter (Signed)
Call from [place] with [facility] asking if patient was cleared of Hep C. Patient did not finish treatment and  still has active Hep C. They are unable to start his MS treatment until he is cleared. Scheduled a follow up appt with Dr. Drue Second to see if patient is able to start treatment again for Hep C.

## 2017-01-07 ENCOUNTER — Inpatient Hospital Stay: Payer: Medicare HMO | Attending: Oncology

## 2017-01-07 VITALS — BP 110/68 | HR 69 | Temp 97.3°F | Resp 20

## 2017-01-07 DIAGNOSIS — G35 Multiple sclerosis: Secondary | ICD-10-CM | POA: Diagnosis not present

## 2017-01-07 DIAGNOSIS — Z79899 Other long term (current) drug therapy: Secondary | ICD-10-CM | POA: Diagnosis not present

## 2017-01-07 MED ORDER — HEPARIN SOD (PORK) LOCK FLUSH 100 UNIT/ML IV SOLN
500.0000 [IU] | Freq: Once | INTRAVENOUS | Status: AC
  Administered 2017-01-07: 500 [IU]

## 2017-01-07 MED ORDER — SODIUM CHLORIDE 0.9 % IJ SOLN
10.0000 mL | Freq: Once | INTRAMUSCULAR | Status: AC
  Administered 2017-01-07: 10 mL
  Filled 2017-01-07: qty 10

## 2017-01-07 MED ORDER — ACETAMINOPHEN 500 MG PO TABS
1000.0000 mg | ORAL_TABLET | Freq: Once | ORAL | Status: AC
  Administered 2017-01-07: 1000 mg via ORAL
  Filled 2017-01-07: qty 2

## 2017-01-07 MED ORDER — SODIUM CHLORIDE 0.9 % IV SOLN
300.0000 mg | Freq: Once | INTRAVENOUS | Status: AC
  Administered 2017-01-07: 300 mg via INTRAVENOUS
  Filled 2017-01-07: qty 15

## 2017-01-07 MED ORDER — HEPARIN SOD (PORK) LOCK FLUSH 100 UNIT/ML IV SOLN
INTRAVENOUS | Status: AC
  Filled 2017-01-07: qty 5

## 2017-01-07 MED ORDER — SODIUM CHLORIDE 0.9 % IV SOLN
Freq: Once | INTRAVENOUS | Status: AC
  Administered 2017-01-07: 10:00:00 via INTRAVENOUS
  Filled 2017-01-07: qty 1000

## 2017-01-07 NOTE — Patient Instructions (Signed)
Instructed to notify his neurologist, Dr Ramiro Harvest for any concerns or side effects from the Tysabri he received today.

## 2017-01-08 ENCOUNTER — Inpatient Hospital Stay: Payer: Medicare HMO

## 2017-01-09 ENCOUNTER — Ambulatory Visit: Payer: Medicare Other

## 2017-01-12 ENCOUNTER — Ambulatory Visit: Payer: Self-pay

## 2017-01-13 ENCOUNTER — Inpatient Hospital Stay: Payer: Medicare HMO

## 2017-01-14 IMAGING — CT CT HEAD W/O CM
2 series · 17 of 30 positions shown, 20 images · non-contrast
Comparison: October 04, 2014 MRI of brain, head CT January 24, 2011

CLINICAL DATA: Headaches for 2 weeks

EXAM:
CT HEAD WITHOUT CONTRAST
TECHNIQUE: Contiguous axial images were obtained from the base of the skull
through the vertex without intravenous contrast.

[Series 2: head w/o · axial · non-contrast · 0.44mm/px · z∈[+1303,+1433]mm · 9 of 34 slices shown, 12 images]
[im 4/34  brain]
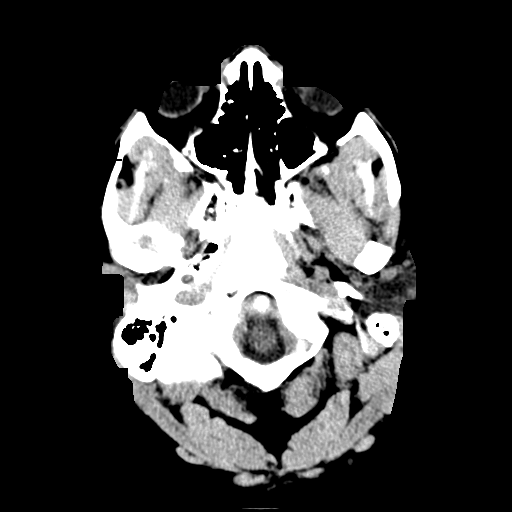
[im 4/34  bone]
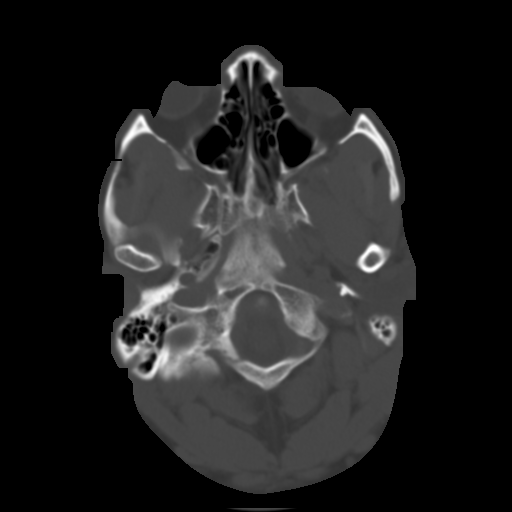
[im 7/34  brain]
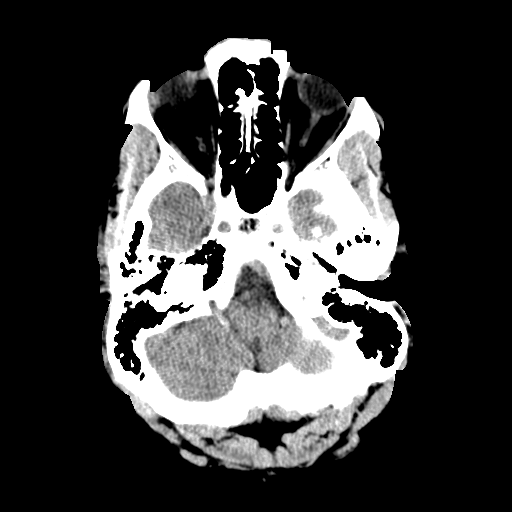
[im 10/34  brain]
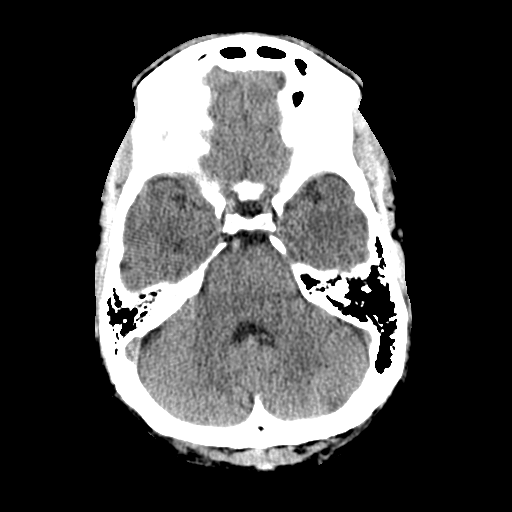
[im 14/34  brain]
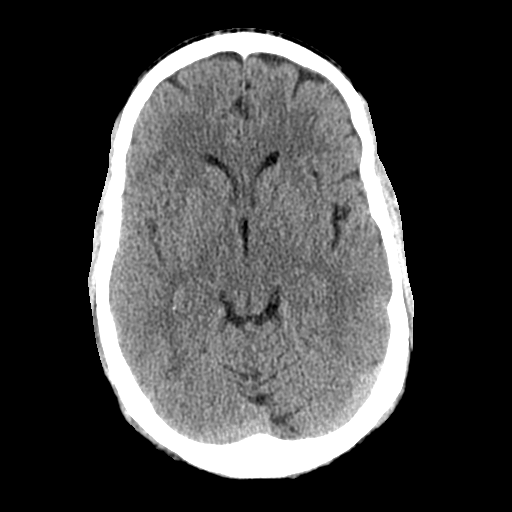
[im 17/34  brain]
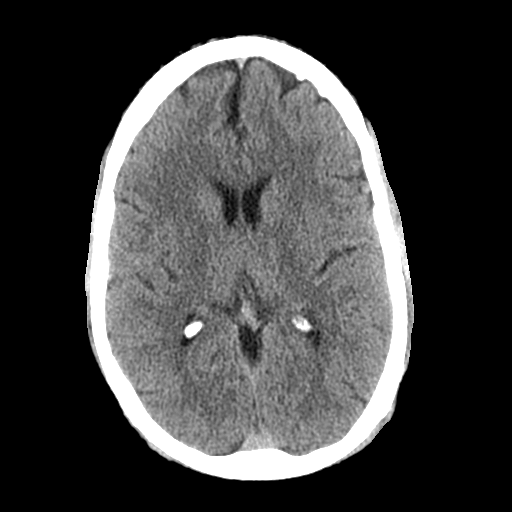
[im 17/34  bone]
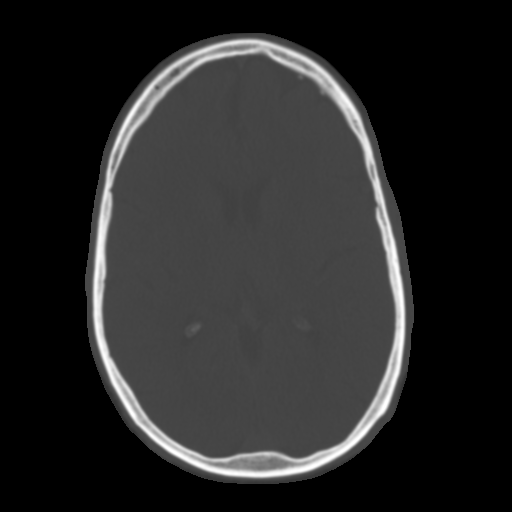
[im 20/34  brain]
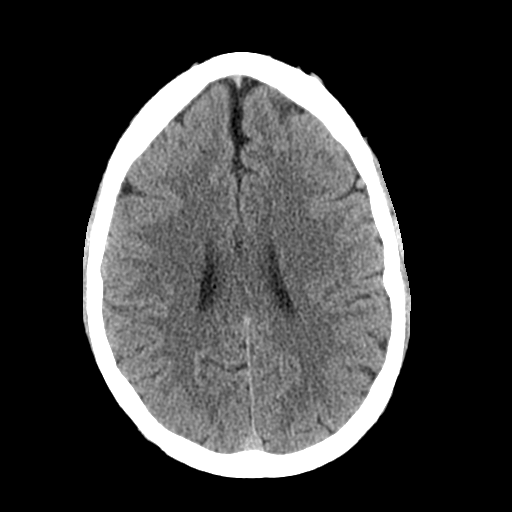
[im 24/34  brain]
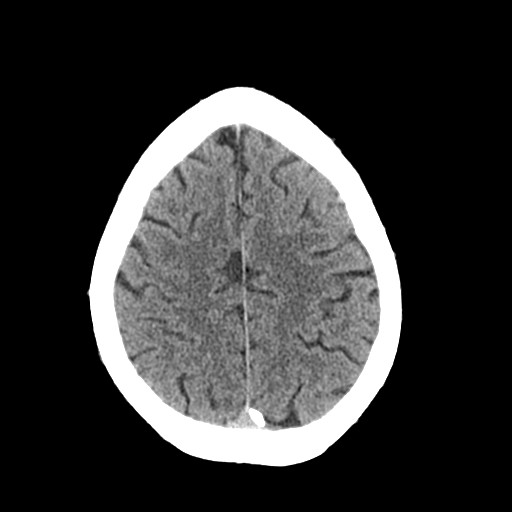
[im 27/34  brain]
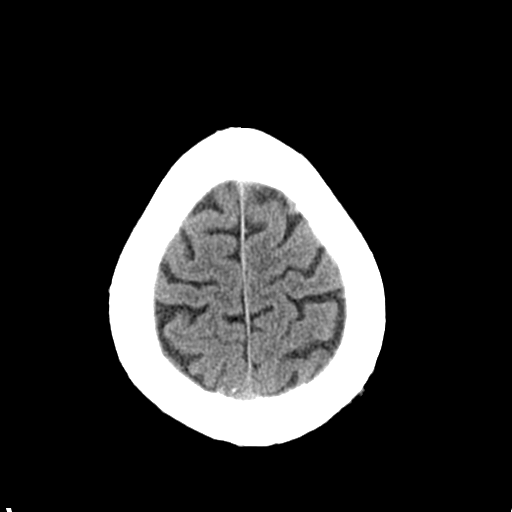
[im 30/34  brain]
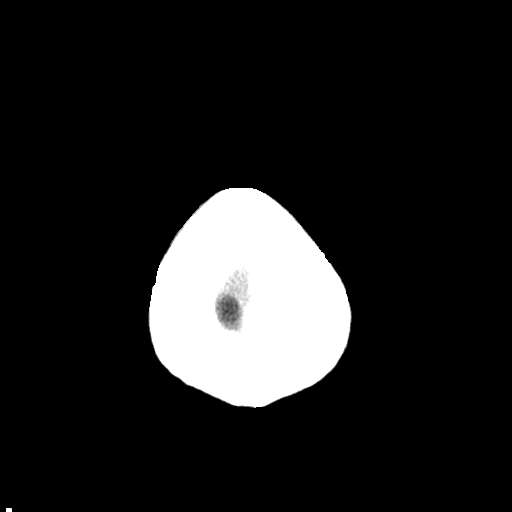
[im 30/34  bone]
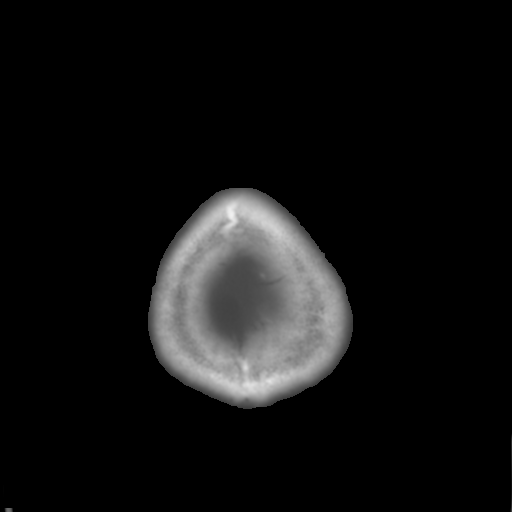

[Series 3: bone windows · axial · 0.44mm/px · z∈[+1306,+1432]mm · 8 of 56 slices shown]
[im 7/56  bone]
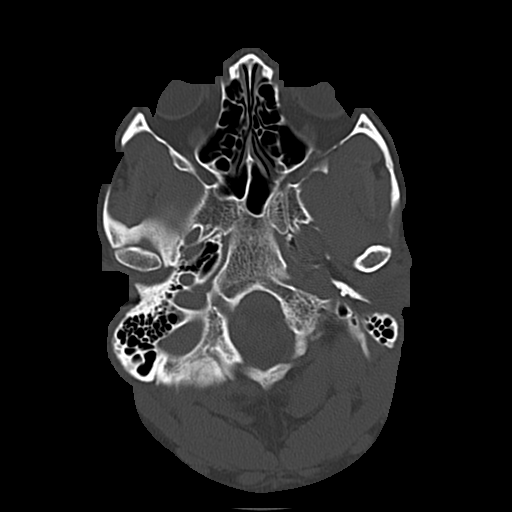
[im 13/56  bone]
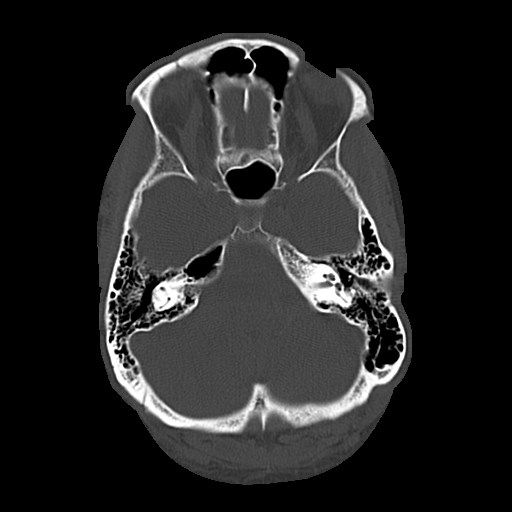
[im 19/56  bone]
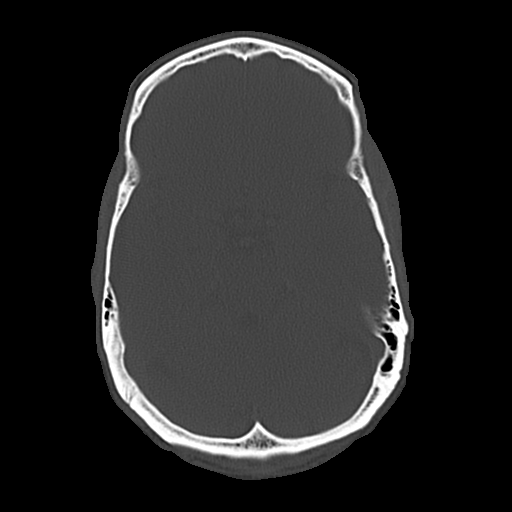
[im 25/56  bone]
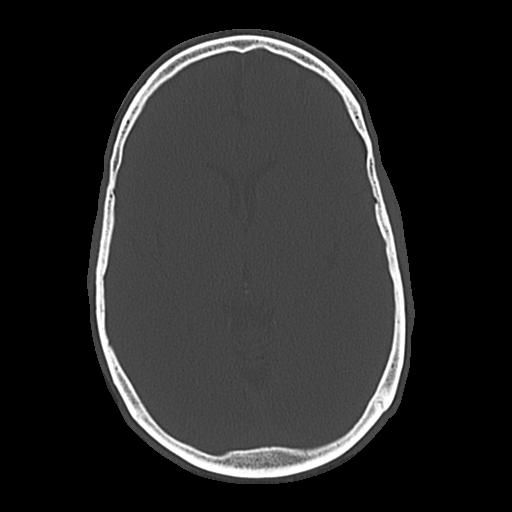
[im 31/56  bone]
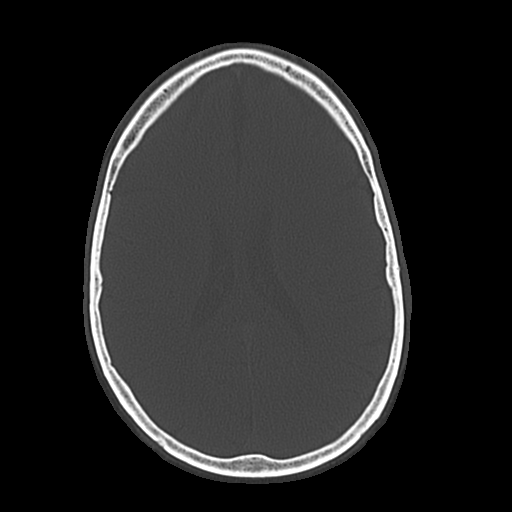
[im 37/56  bone]
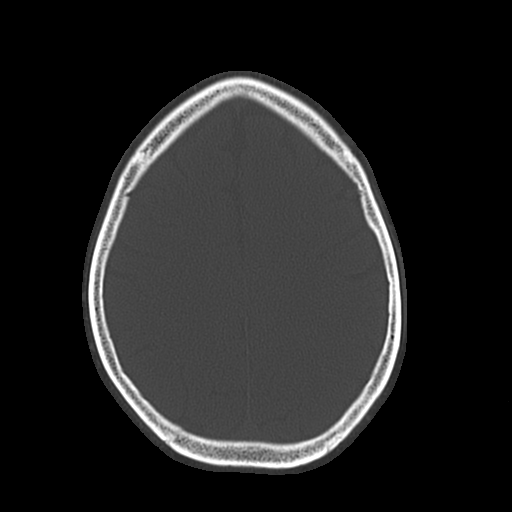
[im 43/56  bone]
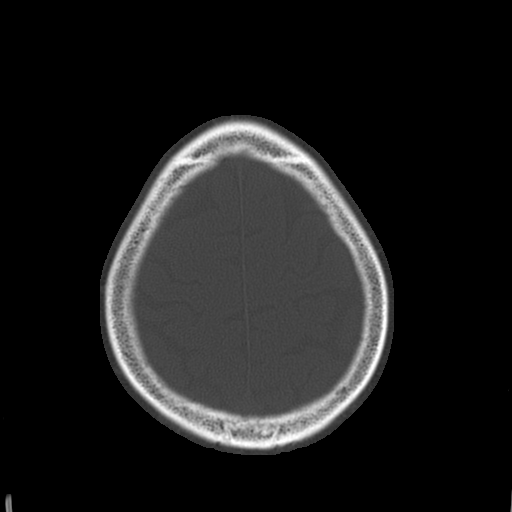
[im 49/56  bone]
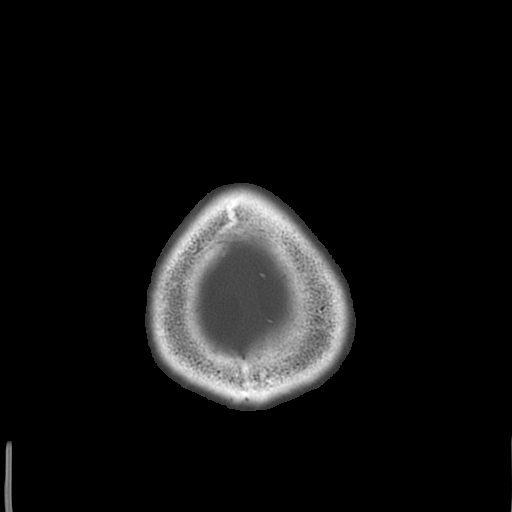

[17 of 30 positions shown; findings below may reference images not displayed]

FINDINGS: There is no midline shift, hydrocephalus, or mass. No acute
hemorrhage or acute transcortical infarct is identified. The bony
calvarium is intact. The visualized sinuses are clear.
IMPRESSION: No focal acute intracranial abnormality identified.

## 2017-01-22 ENCOUNTER — Encounter: Payer: Self-pay | Admitting: Neurology

## 2017-01-22 ENCOUNTER — Ambulatory Visit: Payer: Medicare HMO | Admitting: Neurology

## 2017-01-22 ENCOUNTER — Ambulatory Visit (INDEPENDENT_AMBULATORY_CARE_PROVIDER_SITE_OTHER): Payer: 59 | Admitting: Neurology

## 2017-01-22 VITALS — BP 126/88 | HR 84 | Resp 16 | Ht 69.0 in | Wt 181.0 lb

## 2017-01-22 DIAGNOSIS — Z79899 Other long term (current) drug therapy: Secondary | ICD-10-CM | POA: Diagnosis not present

## 2017-01-22 DIAGNOSIS — G35 Multiple sclerosis: Secondary | ICD-10-CM

## 2017-01-22 DIAGNOSIS — G44029 Chronic cluster headache, not intractable: Secondary | ICD-10-CM

## 2017-01-22 DIAGNOSIS — G4700 Insomnia, unspecified: Secondary | ICD-10-CM | POA: Diagnosis not present

## 2017-01-22 DIAGNOSIS — F418 Other specified anxiety disorders: Secondary | ICD-10-CM | POA: Insufficient documentation

## 2017-01-22 DIAGNOSIS — R269 Unspecified abnormalities of gait and mobility: Secondary | ICD-10-CM

## 2017-01-22 NOTE — Progress Notes (Signed)
[facility]  PATIENT: [name] DOB: 03-19-1961  REFERRING DOCTOR OR PCP: Dr. Fleet Contras SOURCE: patient, records fromPCP, ED, ID, Infusion center.   Imaging and lab results, MRI images on PACS personally reviewed  _________________________________   HISTORICAL  CHIEF COMPLAINT:  Chief Complaint  Patient presents with  . Multiple Sclerosis    [name] is here to transfer care of his MS to Dr. Epimenio Foot. Sts. he was dx. in 2005.  Presenting sx. was weakness in legs/gait disturbance. Sts. dx. confirmed with MRI.  No LP needed.  Sts. he initially tried Copaxone, but stopped due to inj. site problems.  Next tried Avonex, Betaseron and Rebif, but stopped all due to dz. progression.  Sts. he has been on Tysabri for 2-3 yrs. Next infusion scheduled at [facility]. on 01-30-17.  He lives in [place], so would like to switch infusions to our office  . Gait Disturbance    or [facility]. Sts. has always tested negative for JCV ab.  Sts. he is not a candidate for Ocrevus due to hx. of Hep B.  He is in a power w/c today, but sts. he often walks with a walker at home/fim    HISTORY OF PRESENT ILLNESS:  I had the pleasure seeing you patient, [name], [facility] for neurologic consultation regarding his multiple sclerosis.   He is a 56 yo man who diagnosed with MS in 2005.    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   He saw Orthopedics and then saw Dr. Sandria Manly who performed an MRI.   It was consistent with MS and he started Copaxone. Then due to progression he was placed on Rebif and Avonex.[name]    He was referred to Dr. Leotis Shames who placed him on Tysabri a few years ago due to more exacerbations. He is getting his infusions at [facility] and then at [facility].    His last infusion was in February and the next infusion is due 01/30/17.    Sensation:    He notes  numbness and pain in the right arm that has worsened recently.   He gets a benefit from gabapentin.  Gait/strength:    He has difficulty with his gait due to right leg weakness and to clumsiness and ataxia. He had more with gait in 2009 and has used an Mining engineer wheelchair since 2012.    Most of the time he uses his electric wheelchair but he will sometimes use the walker around the house for short distances.   He also notes problems with coordination of the right arm. He has spasticity in the right arm and leg.   He takes baclofen 20 mg 4 times a day with benefit.  Bladder:   He denies bladder or bowel difficulties.  Vision: He notes double vision.   He has not had optic neuritis. In the past, when he would get cluster headaches he would have visual changes  Mood/Cognition:   He notes some depression and anxiety that is helped by Zoloft.  He has noted mild cognitive changes over the past decade. Specifically he notes decreased focus and attention and has had some difficulties with short-term memory and verbal fluency.  Fatigue/sleep: He does not have much difficulty with fatigue. He generally sleeps well at night.   He falls asleep much better when he takes a 3 mg Lunesta at night. He also takes one  of his gabapentin is at night.  Headaches:   He gets cluster headaches and is on Topamax 25 mg by mouth twice a day and Keppra 500 mg by mouth twice a day with benefit.  Other:  He has latent tuberculosis. The hep B core antibody was positive but the surface antigen and antibody were negative. He is hep C positive.   He is on [place].   I personally reviewed MRIs of the brain dated 12/17/2016, 12/12/2010, 03/31/2012 and MRI of the cervical spine dated 11/17/2016.   In the brain, he has a plaque in the posterior right pons and one in the right cerebellar hemisphere and multiple plaques in the white matter. Most of these are nonspecific though a few are periventricular. None of the foci have enhanced on any of  the MRIs are reviewed. An MRI of the cervical spine shows multiple foci within the spinal cord (central at C2-C3, bilateral at C3-C4, to the left at C5, to the right at C6, to the right at T1 there are mild degenerative changes causing mild spinal stenosis at C3-C4 and C4C5. There is foraminal narrowing at C3-C4 and at C5-C6.  REVIEW OF SYSTEMS: Constitutional: No fevers, chills, sweats, or change in appetite.   Insomnia helped by med's Eyes: No visual changes, double vision, eye pain Ear, nose and throat: No hearing loss, ear pain, nasal congestion, sore throat Cardiovascular: No chest pain, palpitations Respiratory: No shortness of breath at rest or with exertion.   No wheezes GastrointestinaI: No nausea, vomiting, diarrhea, abdominal pain, fecal incontinence Genitourinary: No dysuria, urinary retention or frequency.  No nocturia. Musculoskeletal: No neck pain, back pain Integumentary: No rash, pruritus, skin lesions Neurological: as above Psychiatric:  As above. Endocrine: No palpitations, diaphoresis, change in appetite, change in weigh or increased thirst Hematologic/Lymphatic: No anemia, purpura, petechiae. Allergic/Immunologic: No itchy/runny eyes, nasal congestion, recent allergic reactions, rashes  ALLERGIES: No Known Allergies  HOME MEDICATIONS:  Current Outpatient Prescriptions:  .  albuterol (PROVENTIL HFA;VENTOLIN HFA) 108 (90 Base) MCG/ACT inhaler, Inhale 2 puffs into the lungs every 4 (four) hours as needed for wheezing or shortness of breath. (Patient not taking: Reported on 01/07/2017), Disp: 1 Inhaler, Rfl: 0 .  baclofen (LIORESAL) 20 MG tablet, Take 20 mg by mouth 4 (four) times daily. , Disp: , Rfl:  .  Eszopiclone (ESZOPICLONE) 3 MG TABS, Take 3 mg by mouth at bedtime as needed (for sleep). , Disp: , Rfl:  .  gabapentin (NEURONTIN) 600 MG tablet, Take 600 mg by mouth 3 (three) times daily. , Disp: , Rfl:  .  hydrochlorothiazide (HYDRODIURIL) 25 MG tablet, Take 25  mg by mouth daily., Disp: , Rfl: 5 .  levETIRAcetam (KEPPRA) 500 MG tablet, Take 500 mg by mouth 2 (two) times daily., Disp: , Rfl: 5 .  lisinopril-hydrochlorothiazide (PRINZIDE,ZESTORETIC) 10-12.5 MG tablet, Take 1 tablet by mouth daily., Disp: , Rfl: 5 .  omeprazole (PRILOSEC) 40 MG capsule, Take 40 mg by mouth daily., Disp: , Rfl: 4 .  oxyCODONE-acetaminophen (PERCOCET) 10-325 MG tablet, Take 1 tablet by mouth every 4 (four) hours as needed for pain. , Disp: , Rfl:  .  polyethylene glycol (MIRALAX / GLYCOLAX) packet, Take 17 g by mouth daily., Disp: 14 each, Rfl: 0 .  promethazine (PHENERGAN) 25 MG tablet, Take 1 tablet (25 mg total) by mouth every 6 (six) hours as needed (headache). (Patient not taking: Reported on 12/01/2016), Disp: 10 tablet, Rfl: 0 .  senna-docusate (SENOKOT-S) 8.6-50 MG tablet, Take 1 tablet  by mouth 2 (two) times daily., Disp: , Rfl:  .  sertraline (ZOLOFT) 50 MG tablet, Take 50 mg by mouth daily. , Disp: , Rfl:  .  Sofosbuvir-Velpatasvir (EPCLUSA) 400-100 MG TABS, Take 1 tablet by mouth daily., Disp: 30 tablet, Rfl: 2 .  topiramate (TOPAMAX) 25 MG tablet, Take 1 tablet (25 mg total) by mouth 2 (two) times daily., Disp: 30 tablet, Rfl: 0 .  triamterene-hydrochlorothiazide (DYAZIDE) 37.5-25 MG capsule, Take 1 capsule by mouth daily as needed (blood pressure/edema). , Disp: , Rfl: 5 .  verapamil (CALAN-SR) 120 MG CR tablet, Take 1 tablet (120 mg total) by mouth at bedtime., Disp: 30 tablet, Rfl: 0 .  warfarin (COUMADIN) 5 MG tablet, Take 5 mg by mouth daily., Disp: , Rfl:  No current facility-administered medications for this visit.   Facility-Administered Medications Ordered in Other Visits:  .  0.9 %  sodium chloride infusion, , Intravenous, Once, Jeralyn Ruths, MD .  acetaminophen (TYLENOL) tablet 1,000 mg, 1,000 mg, Oral, Once, Jeralyn Ruths, MD .  natalizumab (TYSABRI) 300 mg in sodium chloride 0.9 % 100 mL IVPB, 300 mg, Intravenous, Once, Jeralyn Ruths,  MD .  sodium chloride 0.9 % injection 10 mL, 10 mL, Intracatheter, PRN, Loann Quill, NP, 10 mL at 11/07/15 1012  PAST MEDICAL HISTORY: Past Medical History:  Diagnosis Date  . Abdominal pain, unspecified site   . Anxiety   . Arthritis   . Benign paroxysmal positional vertigo   . Chronic back pain   . Chronic pain syndrome 01/25/2008  . Cluster headache   . Depression    takes Zoloft daily  . DVT (deep venous thrombosis) (HCC)    in the left arm '09  . Gait abnormality    "uses mobile wheelchair, but is ambulatory"  . Gallstones 02/17/2009   resolved after gallbladder surgery.  [name] GERD (gastroesophageal reflux disease)    takes Omeprazole as needed  . Headache(784.0)    cluster headaches frequently-takes Topamax daily  . History of colonoscopy   . HTN (hypertension)    takes Lisinopril,Verapamil,and Triamterene HCTZ daily  . Insomnia 11/06/2008  . Joint pain   . Joint swelling    03-07-16 "swelling of right wrist" "after a fall-xray done 03-06-16 "no fractures".  . Memory loss    no an issue at present 03-07-16  . Multiple sclerosis (HCC)    Dx. 2005 - Dr. Tinnie Gens follows LOV 4'17 tx. Tysabri monthly IV-[facility] , [place].  [name] Nonspecific elevation of levels of transaminase or lactic acid dehydrogenase (LDH)   . Other specified visual disturbances   . Other syndromes affecting cervical region   . Pneumonia 2009  . Trigeminal neuralgia     history" Multiple sclerosis"    PAST SURGICAL HISTORY: Past Surgical History:  Procedure Laterality Date  . CHOLECYSTECTOMY  02/20/2009  . COLONOSCOPY WITH PROPOFOL N/A 03/17/2016   Procedure: COLONOSCOPY WITH PROPOFOL;  Surgeon: [name], MD;  Location: [facility] ENDOSCOPY;  Service: Endoscopy;  Laterality: N/A;  . PORT A CATH REVISION N/A 07/06/2015   Procedure: Removal and replacement of PORT A CATH;  Surgeon: [name], MD;  Location: [facility] OR;  Service: General;  Laterality: N/A;  . PORTACATH PLACEMENT N/A  03/27/2014   Procedure: INSERTION PORT-A-CATH;  Surgeon: [name], MD;  Location: [facility] OR;  Service: General;  Laterality: N/A;    FAMILY HISTORY: Family History  Problem Relation Age of Onset  . Cancer Father   . Diabetes Mother  SOCIAL HISTORY:  Social History   Social History  . Marital status: Divorced    Spouse name: N/A  . Number of children: 3  . Years of education: 65 th   Occupational History  .      Disabled   Social History Main Topics  . Smoking status: Former Smoker    Packs/day: 1.00    Years: 38.00    Types: Cigarettes  . Smokeless tobacco: Never Used     Comment: cutting back  . Alcohol use 0.0 oz/week     Comment: occasional  . Drug use: No     Comment: Quit 2011  . Sexual activity: Not on file   Other Topics Concern  . Not on file   Social History Narrative   Patient is divorced. Patient is disabled. Because of his MS. Patient has 11 th grade education. 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    Right handed.   Caffeine- One daily.     PHYSICAL EXAM  Vitals:   01/22/17 1326  BP: 126/88  Pulse: 84  Resp: 16  Weight: 181 lb (82.1 kg)  Height: 5\' 9"  (1.753 m)    Body mass index is 26.73 kg/m.   General: The patient is well-developed and well-nourished and in no acute distress In an electric wheelchair.  Eyes:  Funduscopic exam shows normal optic discs and retinal vessels.  Neck: The neck is supple, no carotid bruits are noted.  The neck is nontender.  Cardiovascular: The heart has a regular rate and rhythm with a normal S1 and S2. There were no murmurs, gallops or rubs. Lungs are clear to auscultation.  Skin: Extremities are without significant edema.  Musculoskeletal:  Back is nontender  Neurologic Exam  Mental status: The patient is alert and oriented x 3 at the time of the examination. The patient has apparent normal recent and remote memory, with an apparently normal attention span  and concentration ability.   Speech is normal.  Cranial nerves: Extraocular movements are full. Pupils are equal, round, and reactive to light and accomodation.  Visual fields are full.  Facial symmetry is present. There is good facial sensation to soft touch bilaterally.Facial strength is normal.  Trapezius and sternocleidomastoid strength is normal. No dysarthria is noted.  The tongue is midline, and the patient has symmetric elevation of the soft palate. No obvious hearing deficits are noted.  Motor:  Muscle bulk is normal.   Tone is mildly increased in legs, R>L.  Strength is  5 / 5 in arms with mildly reduced RAM on the right. Strength is 4+-5/5 in the left leg and 4- to 4/5 in the right leg.  Sensory: Sensory testing is intact to pinprick, soft touch and vibration sensation in all 4 extremities.  Coordination: Cerebellar testing reveals slightly reduced right finger to nose and poor right heel-to-shin and mildly reduced left heel-to-shin  Gait and station: Station is wide.   Gait is wide and requires unilateral support. The right knee buckles as he walks there is a right foot drop..   Romberg is positive   Reflexes: Deep tendon reflexes are increased in the legs with spread at the knees. There are a few beats of clonus at the right ankle but not the left.   Plantar responses are flexor.    DIAGNOSTIC DATA (LABS, IMAGING, TESTING) - I reviewed patient records, labs, notes, testing and imaging myself where available.  Lab Results  Component Value Date  WBC 21.0 (H) 12/17/2016   HGB 13.5 12/17/2016   HCT 40.1 12/17/2016   MCV 79.1 12/17/2016   PLT 352 12/17/2016      Component Value Date/Time   NA 129 (L) 12/17/2016 1304   K 4.4 12/17/2016 1304   CL 94 (L) 12/17/2016 1304   CO2 29 12/17/2016 1304   GLUCOSE 104 (H) 12/17/2016 1304   BUN 16 12/17/2016 1304   CREATININE 0.77 12/17/2016 1304   CREATININE 0.87 07/08/2016 1153   CALCIUM 8.8 (L) 12/17/2016 1304   PROT 7.2  12/17/2016 1304   ALBUMIN 3.8 12/17/2016 1304   AST 27 12/17/2016 1304   ALT 141 (H) 12/17/2016 1304   ALT 40 09/05/2016 1048   ALKPHOS 99 12/17/2016 1304   BILITOT 0.4 12/17/2016 1304   GFRNONAA >60 12/17/2016 1304   GFRNONAA >89 07/08/2016 1153   GFRAA >60 12/17/2016 1304   GFRAA >89 07/08/2016 1153   No results found for: CHOL, HDL, LDLCALC, LDLDIRECT, TRIG, CHOLHDL Lab Results  Component Value Date   HGBA1C 5.9 (H) 11/16/2016   No results found for: VITAMINB12 No results found for: TSH     ASSESSMENT AND PLAN  Multiple sclerosis (HCC) - Plan: Stratify JCV Antibody Test ([facility]), CBC with Differential/Platelet  Gait disturbance  Insomnia, unspecified type  Depression with anxiety  High risk medication use  Chronic cluster headache, not intractable   In summary, [name] is a 56 year old man with multiple sclerosis who is currently on Tysabri therapy. He appears to be doing well on Tysabri. He tolerates it well and he has had no definite exacerbations over the last few years and his MRIs have been stable. Therefore, we will continue this therapy. I will check a JCV antibody to help assess his risk of PML and a CBC. He is scheduled to have his next infusion in a week at [facility]. We will switch over to our site in April. He would keep the March 23 appointment in [place].   He will continue the baclofen for spasticity and gabapentin for dysesthesias.  He will return to see me for a regular visit in 6 months but will call sooner if he has any new or worsening neurologic symptoms.  Thank you for asking me to see [name] for neurologic consultation. Please let me know if I can be of further assistance with her or other patients in the future.Pearletha Furl. Epimenio Foot, MD, PhD 01/22/2017, 1:34 PM Certified in Neurology, Clinical Neurophysiology, Sleep Medicine, Pain Medicine and Neuroimaging  [facility] [address] [place], [place] [address] [phone]

## 2017-01-23 LAB — CBC WITH DIFFERENTIAL/PLATELET
BASOS ABS: 0.1 10*3/uL (ref 0.0–0.2)
Basos: 1 %
EOS (ABSOLUTE): 0.1 10*3/uL (ref 0.0–0.4)
Eos: 1 %
Hematocrit: 40.9 % (ref 37.5–51.0)
Hemoglobin: 13.6 g/dL (ref 13.0–17.7)
IMMATURE GRANS (ABS): 0.1 10*3/uL (ref 0.0–0.1)
IMMATURE GRANULOCYTES: 2 %
LYMPHS: 51 %
Lymphocytes Absolute: 4.1 10*3/uL — ABNORMAL HIGH (ref 0.7–3.1)
MCH: 26.7 pg (ref 26.6–33.0)
MCHC: 33.3 g/dL (ref 31.5–35.7)
MCV: 80 fL (ref 79–97)
MONOCYTES: 9 %
Monocytes Absolute: 0.7 10*3/uL (ref 0.1–0.9)
NEUTROS ABS: 2.8 10*3/uL (ref 1.4–7.0)
Neutrophils: 36 %
PLATELETS: 353 10*3/uL (ref 150–379)
RBC: 5.1 x10E6/uL (ref 4.14–5.80)
RDW: 17 % — ABNORMAL HIGH (ref 12.3–15.4)
WBC: 7.9 10*3/uL (ref 3.4–10.8)

## 2017-01-27 ENCOUNTER — Encounter: Payer: Self-pay | Admitting: *Deleted

## 2017-01-27 ENCOUNTER — Telehealth: Payer: Self-pay | Admitting: *Deleted

## 2017-01-27 NOTE — Telephone Encounter (Signed)
I have spoken with pt. today.  He is on Tysabri.  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  Per RAS, appt. given 02-02-17 to discuss other tx. options Julaine Hua) due to high risk of PML/fim

## 2017-01-28 ENCOUNTER — Ambulatory Visit: Payer: Self-pay | Admitting: Internal Medicine

## 2017-01-28 NOTE — Telephone Encounter (Signed)
I have spoken with [name] this afternoon.  He had questions about [place].  I answered some of these questions for him, and explained that RAS will have a more detailed conversation with him on Monday. He has a hx. of h/a's and sts. this h/a is typical for past h/a's.  No fever or other signs of infection.  He is taking current rx'd meds.  He sts. he has been more conscious of h/a's since our conversation regarding his positive JCV ab status/risk of PML with continued Ty infusions. He has cancelled his Ty infusion for this Friday and I have explained that RAS would like for him to be off of Ty for about 8 wks prior to starting [place].  He verbalized understanding of all of above./fim

## 2017-01-28 NOTE — Telephone Encounter (Signed)
Pt says his head is hurting bad since last night. He has taken topamax and pain medication. Please call

## 2017-01-30 ENCOUNTER — Inpatient Hospital Stay: Payer: Medicare HMO

## 2017-01-30 MED ORDER — ACETAMINOPHEN 500 MG PO TABS: 1000.0000 mg | ORAL_TABLET | Freq: Once | ORAL | Status: DC

## 2017-01-30 MED ORDER — SODIUM CHLORIDE 0.9 % IV SOLN
300.0000 mg | Freq: Once | INTRAVENOUS | Status: DC
  Filled 2017-01-30: qty 15

## 2017-02-02 ENCOUNTER — Ambulatory Visit (INDEPENDENT_AMBULATORY_CARE_PROVIDER_SITE_OTHER): Payer: 59 | Admitting: Neurology

## 2017-02-02 ENCOUNTER — Encounter: Payer: Self-pay | Admitting: Neurology

## 2017-02-02 VITALS — BP 133/95 | HR 67 | Resp 16 | Ht 69.0 in | Wt 181.0 lb

## 2017-02-02 DIAGNOSIS — Z79899 Other long term (current) drug therapy: Secondary | ICD-10-CM | POA: Diagnosis not present

## 2017-02-02 DIAGNOSIS — G894 Chronic pain syndrome: Secondary | ICD-10-CM | POA: Diagnosis not present

## 2017-02-02 DIAGNOSIS — G35 Multiple sclerosis: Secondary | ICD-10-CM | POA: Diagnosis not present

## 2017-02-02 DIAGNOSIS — R269 Unspecified abnormalities of gait and mobility: Secondary | ICD-10-CM

## 2017-02-02 DIAGNOSIS — F418 Other specified anxiety disorders: Secondary | ICD-10-CM

## 2017-02-02 MED ORDER — DALFAMPRIDINE ER 10 MG PO TB12: 10.0000 mg | ORAL_TABLET | Freq: Two times a day (BID) | ORAL | 11 refills | Status: DC

## 2017-02-02 NOTE — Progress Notes (Signed)
[facility]  PATIENT: [name] DOB: 1961-01-25  REFERRING DOCTOR OR PCP: Dr. Nolene Ebbs SOURCE: patient, records fromPCP, ED, ID, Infusion center.   Imaging and lab results, MRI images on PACS personally reviewed  _________________________________   HISTORICAL  CHIEF COMPLAINT:  Chief Complaint  Patient presents with  . Multiple Sclerosis    Pt. continues to tolerate Tysabri well, but JCV ab recently converted to positive (1.70).  Last Ty infusion was at the end of Feb. 2018.  Here today to discuss other tx. options/fim    HISTORY OF PRESENT ILLNESS:  [name] is a 56 yo man who diagnosed with MS in 2005.    He's been on Tysabri but recently converted to JCV antibody negative to high titer positive (1.7)  We discussed that he is now at a higher risk of PML infection. Some studies have shown that the risk may be as high as 1 out of 100 after 2 years of Tysabri use when a person is high positive JCV antibody.    We went over 2 other options, ocrelizumab and Lemtrada. He has had hepatitis B infection in the past and his hepatitis B DNA level is now negative. He also has had latent tuberculosis. Because of those increased risk factors, I believe the risk of [place] would be preferred over ocrelizumab. We went over the pros and cons of Lemtrada and discussed the monthly monitoring with blood work and urine testing due to the risks of ITP, anti- GBM syndrome (renal) and thyroid disease. He understands those risks and is willing to comply with the monthly monitoring.  His symptoms are doing about the same as his last visit.   ____________________________________  Sensation:    He notes numbness and pain in the right arm that has worsened recently.   He gets a benefit from gabapentin.  Gait/strength:    He has difficulty with his gait due to right leg weakness and to clumsiness and ataxia. He had more with gait in 2009 and has used an IT trainer wheelchair  since 2012.    Most of the time he uses his electric wheelchair but he will sometimes use the walker around the house for short distances.   He also notes problems with coordination of the right arm. He has spasticity in the right arm and leg.   He takes baclofen 20 mg 4 times a day with benefit.    He is on Ampyra and notes continued benefit  Bladder:   He denies bladder or bowel difficulties.  Vision: He notes double vision.   He has not had optic neuritis. In the past, when he would get cluster headaches he would have visual changes  Mood/Cognition:   He notes some depression and anxiety that is helped by Zoloft.  He has noted mild cognitive changes over the past decade. Specifically he notes decreased focus and attention and has had some difficulties with short-term memory and verbal fluency.  Fatigue/sleep: He does not have much difficulty with fatigue. He generally sleeps well at night.   He falls asleep much better when he takes a 3 mg Lunesta at night. He also takes one of his gabapentin is at night.  Headaches:   He gets cluster headaches and is on Topamax 25 mg by mouth twice a day and Keppra 500 mg by mouth twice a day with benefit.  Other:  He has latent tuberculosis. The hep B core antibody was positive but the surface antigen and antibody were negative.  He is hep C positive.   He is on [place].   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   He saw Orthopedics and then saw Dr. Erling Cruz who performed an MRI.   It was consistent with MS and he started Copaxone. Then due to progression he was placed on Rebif and Avonex.[name]    He was referred to Dr. Jacqulynn Cadet who placed him on Tysabri a few years ago due to more exacerbations. He is getting his infusions at [facility] and then at [facility].    His last infusion was in February and the next infusion is due 01/30/17.    I personally reviewed MRIs of the brain dated  12/17/2016, 12/12/2010, 03/31/2012 and MRI of the cervical spine dated 11/17/2016.   In the brain, he has a plaque in the posterior right pons and one in the right cerebellar hemisphere and multiple plaques in the white matter. Most of these are nonspecific though a few are periventricular. None of the foci have enhanced on any of the MRIs are reviewed. An MRI of the cervical spine shows multiple foci within the spinal cord (central at C2-C3, bilateral at C3-C4, to the left at C5, to the right at C6, to the right at T1 there are mild degenerative changes causing mild spinal stenosis at C3-C4 and C4C5. There is foraminal narrowing at C3-C4 and at C5-C6.  REVIEW OF SYSTEMS: Constitutional: No fevers, chills, sweats, or change in appetite.   Insomnia helped by med's Eyes: No visual changes, double vision, eye pain Ear, nose and throat: No hearing loss, ear pain, nasal congestion, sore throat Cardiovascular: No chest pain, palpitations Respiratory: No shortness of breath at rest or with exertion.   No wheezes GastrointestinaI: No nausea, vomiting, diarrhea, abdominal pain, fecal incontinence Genitourinary: No dysuria, urinary retention or frequency.  No nocturia. Musculoskeletal: No neck pain, back pain Integumentary: No rash, pruritus, skin lesions Neurological: as above Psychiatric:  As above. Endocrine: No palpitations, diaphoresis, change in appetite, change in weigh or increased thirst Hematologic/Lymphatic: No anemia, purpura, petechiae. Allergic/Immunologic: No itchy/runny eyes, nasal congestion, recent allergic reactions, rashes  ALLERGIES: No Known Allergies  HOME MEDICATIONS:  Current Outpatient Prescriptions:  .  baclofen (LIORESAL) 20 MG tablet, Take 20 mg by mouth 4 (four) times daily. , Disp: , Rfl:  .  Eszopiclone (ESZOPICLONE) 3 MG TABS, Take 3 mg by mouth at bedtime as needed (for sleep). , Disp: , Rfl:  .  gabapentin (NEURONTIN) 600 MG tablet, Take 600 mg by mouth 3 (three)  times daily. , Disp: , Rfl:  .  hydrochlorothiazide (HYDRODIURIL) 25 MG tablet, Take 25 mg by mouth daily., Disp: , Rfl: 5 .  levETIRAcetam (KEPPRA) 500 MG tablet, Take 500 mg by mouth 2 (two) times daily., Disp: , Rfl: 5 .  lisinopril-hydrochlorothiazide (PRINZIDE,ZESTORETIC) 10-12.5 MG tablet, Take 1 tablet by mouth daily., Disp: , Rfl: 5 .  omeprazole (PRILOSEC) 40 MG capsule, Take 40 mg by mouth daily., Disp: , Rfl: 4 .  oxyCODONE-acetaminophen (PERCOCET) 10-325 MG tablet, Take 1 tablet by mouth every 4 (four) hours as needed for pain. , Disp: , Rfl:  .  polyethylene glycol (MIRALAX / GLYCOLAX) packet, Take 17 g by mouth daily., Disp: 14 each, Rfl: 0 .  promethazine (PHENERGAN) 25 MG tablet, Take 1 tablet (25 mg total) by mouth every 6 (six) hours as needed (headache)., Disp: 10 tablet, Rfl: 0 .  senna-docusate (SENOKOT-S) 8.6-50 MG tablet, Take 1 tablet by mouth 2 (two) times daily., Disp: , Rfl:  .  sertraline (ZOLOFT) 50 MG tablet, Take 50 mg by mouth daily. , Disp: , Rfl:  .  Sofosbuvir-Velpatasvir (EPCLUSA) 400-100 MG TABS, Take 1 tablet by mouth daily., Disp: 30 tablet, Rfl: 2 .  topiramate (TOPAMAX) 25 MG tablet, Take 1 tablet (25 mg total) by mouth 2 (two) times daily., Disp: 30 tablet, Rfl: 0 .  triamterene-hydrochlorothiazide (DYAZIDE) 37.5-25 MG capsule, Take 1 capsule by mouth daily as needed (blood pressure/edema). , Disp: , Rfl: 5 .  verapamil (CALAN-SR) 120 MG CR tablet, Take 1 tablet (120 mg total) by mouth at bedtime., Disp: 30 tablet, Rfl: 0 .  warfarin (COUMADIN) 5 MG tablet, Take 5 mg by mouth daily., Disp: , Rfl:  .  albuterol (PROVENTIL HFA;VENTOLIN HFA) 108 (90 Base) MCG/ACT inhaler, Inhale 2 puffs into the lungs every 4 (four) hours as needed for wheezing or shortness of breath. (Patient not taking: Reported on 02/02/2017), Disp: 1 Inhaler, Rfl: 0 .  dalfampridine 10 MG TB12, Take 1 tablet (10 mg total) by mouth 2 (two) times daily., Disp: 60 tablet, Rfl: 11 .  natalizumab  (TYSABRI) 300 MG/15ML injection, Inject 300 mg into the vein., Disp: , Rfl:  No current facility-administered medications for this visit.   Facility-Administered Medications Ordered in Other Visits:  .  0.9 %  sodium chloride infusion, , Intravenous, Once, Jeralyn Ruths, MD .  acetaminophen (TYLENOL) tablet 1,000 mg, 1,000 mg, Oral, Once, Jeralyn Ruths, MD .  acetaminophen (TYLENOL) tablet 1,000 mg, 1,000 mg, Oral, Once, Jeralyn Ruths, MD .  natalizumab (TYSABRI) 300 mg in sodium chloride 0.9 % 100 mL IVPB, 300 mg, Intravenous, Once, Jeralyn Ruths, MD .  natalizumab (TYSABRI) 300 mg in sodium chloride 0.9 % 100 mL IVPB, 300 mg, Intravenous, Once, Jeralyn Ruths, MD .  sodium chloride 0.9 % injection 10 mL, 10 mL, Intracatheter, PRN, Loann Quill, NP, 10 mL at 11/07/15 1012  PAST MEDICAL HISTORY: Past Medical History:  Diagnosis Date  . Abdominal pain, unspecified site   . Anxiety   . Arthritis   . Benign paroxysmal positional vertigo   . Chronic back pain   . Chronic pain syndrome 01/25/2008  . Cluster headache   . Depression    takes Zoloft daily  . DVT (deep venous thrombosis) (HCC)    in the left arm '09  . Gait abnormality    "uses mobile wheelchair, but is ambulatory"  . Gallstones 02/17/2009   resolved after gallbladder surgery.  [name] GERD (gastroesophageal reflux disease)    takes Omeprazole as needed  . Headache(784.0)    cluster headaches frequently-takes Topamax daily  . History of colonoscopy   . HTN (hypertension)    takes Lisinopril,Verapamil,and Triamterene HCTZ daily  . Insomnia 11/06/2008  . Joint pain   . Joint swelling    03-07-16 "swelling of right wrist" "after a fall-xray done 03-06-16 "no fractures".  . Memory loss    no an issue at present 03-07-16  . Multiple sclerosis (HCC)    Dx. 2005 - Dr. Tinnie Gens follows LOV 4'17 tx. Tysabri monthly IV-[facility] , [place].  [name] Nonspecific elevation of levels of transaminase or  lactic acid dehydrogenase (LDH)   . Other specified visual disturbances   . Other syndromes affecting cervical region   . Pneumonia 2009  . Trigeminal neuralgia     history" Multiple sclerosis"    PAST  SURGICAL HISTORY: Past Surgical History:  Procedure Laterality Date  . CHOLECYSTECTOMY  02/20/2009  . COLONOSCOPY WITH PROPOFOL N/A 03/17/2016   Procedure: COLONOSCOPY WITH PROPOFOL;  Surgeon: [name], MD;  Location: [facility];  Service: Endoscopy;  Laterality: N/A;  . PORT A CATH REVISION N/A 07/06/2015   Procedure: Removal and replacement of PORT A CATH;  Surgeon: [name], MD;  Location: [facility] OR;  Service: General;  Laterality: N/A;  . PORTACATH PLACEMENT N/A 03/27/2014   Procedure: INSERTION PORT-A-CATH;  Surgeon: [name], MD;  Location: [facility] OR;  Service: General;  Laterality: N/A;    FAMILY HISTORY: Family History  Problem Relation Age of Onset  . Cancer Father   . Diabetes Mother     SOCIAL HISTORY:  Social History   Social History  . Marital status: Divorced    Spouse name: N/A  . Number of children: 3  . Years of education: 60 th   Occupational History  .      Disabled   Social History Main Topics  . Smoking status: Former Smoker    Packs/day: 1.00    Years: 38.00    Types: Cigarettes  . Smokeless tobacco: Never Used     Comment: cutting back  . Alcohol use 0.0 oz/week     Comment: occasional  . Drug use: No     Comment: Quit 2011  . Sexual activity: Not on file   Other Topics Concern  . Not on file   Social History Narrative   Patient is divorced. Patient is disabled. Because of his MS. Patient has 11 th grade education. 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    Right handed.   Caffeine- One daily.     PHYSICAL EXAM  Vitals:   02/02/17 1410  BP: (!) 133/95  Pulse: 67  Resp: 16  Weight: 181 lb (82.1 kg)  Height: 5\' 9"  (1.753 m)    Body mass index is 26.73 kg/m.   General: The patient  is well-developed and well-nourished and in no acute distress In an electric wheelchair.  Neurologic Exam  Mental status: The patient is alert and oriented x 3 at the time of the examination. The patient has apparent normal recent and remote memory, with an apparently normal attention span and concentration ability.   Speech is normal.  Cranial nerves: Extraocular movements are full.  There is good facial sensation to soft touch bilaterally.Facial strength is normal.  Trapezius and sternocleidomastoid strength is normal. No dysarthria is noted.  The tongue is midline, and the patient has symmetric elevation of the soft palate. No obvious hearing deficits are noted.  Motor:  Muscle bulk is normal.   Tone is mildly increased in legs, R>L.  Strength is  5 / 5 in arms with mildly reduced RAM on the right. Strength is 4+-5/5 in the left leg and 4- to 4/5 in the right leg.  Sensory: Sensory testing is intact to pinprick, soft touch and vibration sensation in all 4 extremities.  Coordination: Cerebellar testing reveals slightly reduced right finger to nose and poor right heel-to-shin and mildly reduced left heel-to-shin  Gait and station: Station is wide.   Gait is wide and requires unilateral support.  Severe right foot and leg drop.   Romberg is positive   Reflexes: Deep tendon reflexes are increased in the legs with spread at the knees. There are a few beats of clonus at the right ankle but not the left.  Plantar responses are flexor.    DIAGNOSTIC DATA (LABS, IMAGING, TESTING) - I reviewed patient records, labs, notes, testing and imaging myself where available.  Lab Results  Component Value Date   WBC 7.9 01/22/2017   HGB 13.5 12/17/2016   HCT 40.9 01/22/2017   MCV 80 01/22/2017   PLT 353 01/22/2017      Component Value Date/Time   NA 129 (L) 12/17/2016 1304   K 4.4 12/17/2016 1304   CL 94 (L) 12/17/2016 1304   CO2 29 12/17/2016 1304   GLUCOSE 104 (H) 12/17/2016 1304   BUN 16  12/17/2016 1304   CREATININE 0.77 12/17/2016 1304   CREATININE 0.87 07/08/2016 1153   CALCIUM 8.8 (L) 12/17/2016 1304   PROT 7.2 12/17/2016 1304   ALBUMIN 3.8 12/17/2016 1304   AST 27 12/17/2016 1304   ALT 141 (H) 12/17/2016 1304   ALT 40 09/05/2016 1048   ALKPHOS 99 12/17/2016 1304   BILITOT 0.4 12/17/2016 1304   GFRNONAA >60 12/17/2016 1304   GFRNONAA >89 07/08/2016 1153   GFRAA >60 12/17/2016 1304   GFRAA >89 07/08/2016 1153   No results found for: CHOL, HDL, LDLCALC, LDLDIRECT, TRIG, CHOLHDL Lab Results  Component Value Date   HGBA1C 5.9 (H) 11/16/2016        ASSESSMENT AND PLAN  Multiple sclerosis (HCC) - Plan: TSH, Basic metabolic panel  Chronic pain syndrome  High risk medication use - Plan: TSH, Basic metabolic panel  Gait disturbance  Depression with anxiety  1.   We went over risks and benefits of Lemtrada and need for REMS monthly monitoring.   He feels he can be compliant.   He signed SRF   Check labs. 2.  Continue other med's 3.   rtc for infusion or sooner if any new or worsening symptoms  Yoana Staib A. Epimenio Foot, MD, PhD 02/02/2017, 5:47 PM Certified in Neurology, Clinical Neurophysiology, Sleep Medicine, Pain Medicine and Neuroimaging  [facility] [address], [address] [place], [place] [address] [phone]

## 2017-02-03 ENCOUNTER — Inpatient Hospital Stay: Payer: Medicare HMO

## 2017-02-03 LAB — BASIC METABOLIC PANEL
BUN/Creatinine Ratio: 20 (ref 9–20)
BUN: 17 mg/dL (ref 6–24)
CALCIUM: 9.6 mg/dL (ref 8.7–10.2)
CHLORIDE: 102 mmol/L (ref 96–106)
CO2: 24 mmol/L (ref 18–29)
Creatinine, Ser: 0.86 mg/dL (ref 0.76–1.27)
GFR calc Af Amer: 112 mL/min/{1.73_m2} (ref >59)
GFR, EST NON AFRICAN AMERICAN: 97 mL/min/{1.73_m2} (ref >59)
Glucose: 120 mg/dL — ABNORMAL HIGH (ref 65–99)
POTASSIUM: 3.8 mmol/L (ref 3.5–5.2)
Sodium: 139 mmol/L (ref 134–144)

## 2017-02-03 LAB — TSH: TSH: 0.998 u[IU]/mL (ref 0.450–4.500)

## 2017-02-04 ENCOUNTER — Telehealth: Payer: Self-pay | Admitting: *Deleted

## 2017-02-04 NOTE — Telephone Encounter (Signed)
-----   Message from Asa Lente, MD sent at 02/03/2017  3:24 PM EDT ----- Labs are okay. If not already done so, we can send in the  form.

## 2017-02-04 NOTE — Telephone Encounter (Signed)
I have spoken with [name] and per RAS, advised labs done in our office were ok--will send [name] in.  He is agreeable/fim

## 2017-02-05 ENCOUNTER — Inpatient Hospital Stay: Payer: Medicare HMO

## 2017-02-05 ENCOUNTER — Other Ambulatory Visit: Payer: Self-pay | Admitting: Oncology

## 2017-02-05 MED ORDER — ACETAMINOPHEN 500 MG PO TABS: 1000.0000 mg | ORAL_TABLET | Freq: Once | ORAL | Status: DC

## 2017-02-05 MED ORDER — SODIUM CHLORIDE 0.9 % IV SOLN
Freq: Once | INTRAVENOUS | Status: DC
Start: 2017-02-06 — End: 2017-05-09
  Filled 2017-02-05: qty 1000

## 2017-02-05 MED ORDER — SODIUM CHLORIDE 0.9 % IV SOLN
300.0000 mg | Freq: Once | INTRAVENOUS | Status: DC
  Filled 2017-02-05: qty 15

## 2017-02-06 ENCOUNTER — Ambulatory Visit: Payer: Medicare Other

## 2017-02-06 ENCOUNTER — Inpatient Hospital Stay: Payer: Medicare HMO | Attending: Oncology

## 2017-02-09 ENCOUNTER — Ambulatory Visit: Payer: Self-pay

## 2017-02-09 NOTE — Telephone Encounter (Signed)
Patient calling to discuss Lemtrada. He says he does not want to take because of side effects of headaches.

## 2017-02-09 NOTE — Telephone Encounter (Signed)
I have spoken with Alanzo again this morning and advised that, per RAS, risk of h/a's with Julaine Hua is more during the first week of tx.  He is not a good candidate to remain on Tysabri due to high JCV ab titer.  He is not a good candidate for Ocrevus due to abnormal TB test. He verbalized understanding of same, sts. still has concerns about starting [place].  Appt. with RAS given to discuss further/fim

## 2017-02-09 NOTE — Telephone Encounter (Signed)
I have spoken with [name] this morning.  He sts. he is afraid Julaine Hua will worsen h/a's, which he already has a problem with.  Would like to know if there are other options for him.  Will check with RAS and call him back/fim

## 2017-02-10 ENCOUNTER — Inpatient Hospital Stay: Payer: Medicare HMO

## 2017-02-10 ENCOUNTER — Encounter: Payer: Self-pay | Admitting: Internal Medicine

## 2017-02-10 ENCOUNTER — Ambulatory Visit (INDEPENDENT_AMBULATORY_CARE_PROVIDER_SITE_OTHER): Payer: 59 | Admitting: Internal Medicine

## 2017-02-10 VITALS — BP 111/73 | HR 72 | Temp 98.6°F

## 2017-02-10 DIAGNOSIS — B182 Chronic viral hepatitis C: Secondary | ICD-10-CM | POA: Diagnosis not present

## 2017-02-10 DIAGNOSIS — G35 Multiple sclerosis: Secondary | ICD-10-CM | POA: Diagnosis not present

## 2017-02-10 DIAGNOSIS — G44009 Cluster headache syndrome, unspecified, not intractable: Secondary | ICD-10-CM

## 2017-02-10 LAB — COMPLETE METABOLIC PANEL WITH GFR
ALT: 123 U/L — ABNORMAL HIGH (ref 9–46)
AST: 69 U/L — ABNORMAL HIGH (ref 10–35)
Albumin: 4.3 g/dL (ref 3.6–5.1)
Alkaline Phosphatase: 92 U/L (ref 40–115)
BILIRUBIN TOTAL: 0.4 mg/dL (ref 0.2–1.2)
BUN: 22 mg/dL (ref 7–25)
CHLORIDE: 102 mmol/L (ref 98–110)
CO2: 23 mmol/L (ref 20–31)
Calcium: 9.4 mg/dL (ref 8.6–10.3)
Creat: 1.04 mg/dL (ref 0.70–1.33)
GFR, EST NON AFRICAN AMERICAN: 80 mL/min (ref >=60)
Glucose, Bld: 100 mg/dL — ABNORMAL HIGH (ref 65–99)
Potassium: 3.7 mmol/L (ref 3.5–5.3)
Sodium: 139 mmol/L (ref 135–146)
TOTAL PROTEIN: 8.4 g/dL — AB (ref 6.1–8.1)

## 2017-02-10 NOTE — Progress Notes (Signed)
RFV: chronic hep C, GT2b, F4  Patient ID: [name], male   DOB: 10-Dec-1960, 56 y.o.   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 He was started on sofosbuvir/velpatasvir on Nov 20th. He started to have worsening HA from his baseline, as one would expect due to the SE of epclusa. He did not make adherence follow up appt. He then was in the ED for cluster headache on 12/29 where he states that he had stopped taking his epclusa. In total, he finished 2.5 of his 3 months of his hep c treatmet  He was previously on tysabri and it was noted that he had high titer for JC virus., which can place you at risk for PML. His neurologist, Dr Epimenio Foot has stopped his tysabri infusion at end of February. He is considering to be placed on ocrelizumab )anti-CD 20)and lemtrada, however, his hep c needs to be treated prior to initiation of these medications  He states his headaches are still present   Outpatient Encounter Prescriptions as of 02/10/2017  Medication Sig  . albuterol (PROVENTIL HFA;VENTOLIN HFA) 108 (90 Base) MCG/ACT inhaler Inhale 2 puffs into the lungs every 4 (four) hours as needed for wheezing or shortness of breath.  . Alemtuzumab (LEMTRADA) 12 MG/1.2ML SOLN Inject 12 mg into the vein.  . baclofen (LIORESAL) 20 MG tablet Take 20 mg by mouth 4 (four) times daily.   [name] dalfampridine 10 MG TB12 Take 1 tablet (10 mg total) by mouth 2 (two) times daily.  . Eszopiclone (ESZOPICLONE) 3 MG TABS Take 3 mg by mouth at bedtime as needed (for sleep).   . gabapentin (NEURONTIN) 600 MG tablet Take 600 mg by mouth 3 (three) times daily.   . hydrochlorothiazide (HYDRODIURIL) 25 MG tablet Take 25 mg by mouth daily.  [name] levETIRAcetam (KEPPRA) 500 MG tablet Take 500 mg by mouth 2 (two) times daily.  [name] lisinopril-hydrochlorothiazide (PRINZIDE,ZESTORETIC) 10-12.5 MG tablet Take 1 tablet by mouth daily.  . natalizumab (TYSABRI) 300  MG/15ML injection Inject 300 mg into the vein.  [name] omeprazole (PRILOSEC) 40 MG capsule Take 40 mg by mouth daily.  [name] oxyCODONE-acetaminophen (PERCOCET) 10-325 MG tablet Take 1 tablet by mouth every 4 (four) hours as needed for pain.   . polyethylene glycol (MIRALAX / GLYCOLAX) packet Take 17 g by mouth daily.  . promethazine (PHENERGAN) 25 MG tablet Take 1 tablet (25 mg total) by mouth every 6 (six) hours as needed (headache).  . senna-docusate (SENOKOT-S) 8.6-50 MG tablet Take 1 tablet by mouth 2 (two) times daily.  . sertraline (ZOLOFT) 50 MG tablet Take 50 mg by mouth daily.   . Sofosbuvir-Velpatasvir (EPCLUSA) 400-100 MG TABS Take 1 tablet by mouth daily.  [name] topiramate (TOPAMAX) 25 MG tablet Take 1 tablet (25 mg total) by mouth 2 (two) times daily.  [name] triamterene-hydrochlorothiazide (DYAZIDE) 37.5-25 MG capsule Take 1 capsule by mouth daily as needed (blood pressure/edema).   . verapamil (CALAN-SR) 120 MG CR tablet Take 1 tablet (120 mg total) by mouth at bedtime.  [name] warfarin (COUMADIN) 5 MG tablet Take 5 mg by mouth daily.   Facility-Administered Encounter Medications as of 02/10/2017  Medication  . 0.9 %  sodium chloride infusion  . 0.9 %  sodium chloride infusion  . acetaminophen (TYLENOL) tablet 1,000 mg  . acetaminophen (TYLENOL) tablet 1,000 mg  . acetaminophen (TYLENOL) tablet 1,000 mg  . natalizumab (TYSABRI)  300 mg in sodium chloride 0.9 % 100 mL IVPB  . natalizumab (TYSABRI) 300 mg in sodium chloride 0.9 % 100 mL IVPB  . natalizumab (TYSABRI) 300 mg in sodium chloride 0.9 % 100 mL IVPB  . sodium chloride 0.9 % injection 10 mL     Patient Active Problem List   Diagnosis Date Noted  . Gait disturbance 01/22/2017  . Insomnia 01/22/2017  . Depression with anxiety 01/22/2017  . High risk medication use 01/22/2017  . Major depressive disorder, recurrent episode (HCC) 12/19/2016  . Suicidal ideation   . AKI (acute kidney injury) (HCC) 11/15/2016  . Chronic hepatitis C virus  infection (HCC) 11/15/2016  . Chronic abdominal pain 11/15/2016  . Chronic pain syndrome 11/15/2016  . Acute retention of urine 11/15/2016  . Generalized abdominal pain   . Chronic cluster headache, not intractable   . Community acquired pneumonia   . TB lung, latent   . HCAP (healthcare-associated pneumonia) 02/16/2016  . Hypokalemia 02/16/2016  . Intractable cluster headache syndrome   . DVT (deep venous thrombosis) (HCC)   . Depression   . GERD (gastroesophageal reflux disease)   . HTN (hypertension)   . Essential hypertension   . Gastroesophageal reflux disease without esophagitis   . CAP (community acquired pneumonia)   . Multiple sclerosis (HCC) 08/02/2013  . ABDOMINAL BLOATING 10/14/2010  . LOOSE STOOLS 10/14/2010  . PULMONARY EMBOLISM, HX OF 10/14/2010     Health Maintenance Due  Topic Date Due  . Janet Berlin  11/12/1979    Social History  Substance Use Topics  . Smoking status: Former Smoker    Packs/day: 1.00    Years: 38.00    Types: Cigarettes  . Smokeless tobacco: Never Used     Comment: cutting back  . Alcohol use 0.0 oz/week     Comment: occasional   Review of Systems +headache, muscle cramping from MS. Otherwise 12 point ros is negative Physical Exam   BP 111/73   Pulse 72   Temp 98.6 F (37 C) (Oral)   Physical Exam  Constitutional: He is oriented to person, place, and time. He appears well-developed and well-nourished. No distress.  HENT:  Mouth/Throat: Oropharynx is clear and moist. No oropharyngeal exudate.  Cardiovascular: Normal rate, regular rhythm and normal heart sounds. Exam reveals no gallop and no friction rub.  No murmur heard.  Pulmonary/Chest: Effort normal and breath sounds normal. No respiratory distress. He has no wheezes.  Lymphadenopathy:  He has no cervical adenopathy.   Skin: Skin is warm and dry. No rash noted. No erythema.  Psychiatric: He has a normal mood and affect. His behavior is normal.    CBC Lab Results    Component Value Date   WBC 7.9 01/22/2017   RBC 5.10 01/22/2017   HGB 13.5 12/17/2016   HCT 40.9 01/22/2017   PLT 353 01/22/2017   MCV 80 01/22/2017   MCH 26.7 01/22/2017   MCHC 33.3 01/22/2017   RDW 17.0 (H) 01/22/2017   LYMPHSABS 4.1 (H) 01/22/2017   MONOABS 1.5 (H) 12/17/2016   EOSABS 0.1 01/22/2017    BMET Lab Results  Component Value Date   NA 139 02/02/2017   K 3.8 02/02/2017   CL 102 02/02/2017   CO2 24 02/02/2017   GLUCOSE 120 (H) 02/02/2017   BUN 17 02/02/2017   CREATININE 0.86 02/02/2017   CALCIUM 9.6 02/02/2017   GFRNONAA 97 02/02/2017   GFRAA 112 02/02/2017      Assessment and Plan  Chronic hepatitis c  without hepatic coma = he had spotty adherence to NS5a/NS5b treatment which is very concerning that he may have developed resistance. We will check for both resistance testing. Will also get limited RUQ for [facility] surveillance.  From treatment standpoint. All hep C treatments will cause headache. It appears we need to have aggressive control of his headaches prior to initiation of hep C treatment.  With hep C treatment he will need home health visits at least twice aweek to guarantee adherence. We have few options for treatment for him.  Chronic headache/cluster headache = will need to reach out to   dr Tinnie Gens [facility] neurology) and dr. Epimenio Foot (MS) to manage ha.  MS = patient is  worried about side effects of MS regimen that is currently proposed. Defer to dr sater.

## 2017-02-11 LAB — AFP TUMOR MARKER: AFP-Tumor Marker: 2.4 ng/mL (ref <6.1)

## 2017-02-11 NOTE — Telephone Encounter (Signed)
Patient wants to know when he can start [place].

## 2017-02-11 NOTE — Telephone Encounter (Signed)
I have spoken with Dacian this afternoon and per RAS, explained that since hep c is not completely cleared up, he is not able to start Lemtrada.  Ok to start Tecfidera.  He has an appt. with RAS on Monday and will discuss further that time/fim

## 2017-02-11 NOTE — Telephone Encounter (Signed)
I have spoken with Jwan this morning.  He sts. he has read some more about Julaine Hua, feels he would like to start it.  Doesn't need to discuss further with RAS.  I will let Inetta Fermo know, so [facility] can begin approval process.  He c/o increased h/a, he feels due to increased stress.  Would like med.  He already sees pain mx. and receives Oxycodone from them.  He is on Coumadin so can't take additional bd. thinning med.  Will check with RAS and call him back/fim

## 2017-02-11 NOTE — Telephone Encounter (Signed)
I just reviewed the note from his ID Dr. He saw yesterday.    The hepatitis C infection was not cleared (compliance issues). It looks like she is doing some additional blood work and then will get him back on a Hep C treatment.  Because of that, he needs to hold off on [place].   We should be able to more safely use one of the oral agents such as Tecfidera (with his liver issues I would be reluctant to put him on Aubagio).   Therefore, we should have him on Tecfidera until he has the hepatitis cleared.    Once it is cleared, he can then go to [place].  Please have him either come in to sign the form or we can mail to him to have him sign it and send it back.  For the headaches, increase the Topamax from 25 mg twice a day to 50 mg twice a day.

## 2017-02-16 ENCOUNTER — Ambulatory Visit (INDEPENDENT_AMBULATORY_CARE_PROVIDER_SITE_OTHER): Payer: 59 | Admitting: Neurology

## 2017-02-16 ENCOUNTER — Encounter: Payer: Self-pay | Admitting: Neurology

## 2017-02-16 VITALS — BP 179/83 | HR 87 | Resp 18 | Ht 69.0 in

## 2017-02-16 DIAGNOSIS — G894 Chronic pain syndrome: Secondary | ICD-10-CM

## 2017-02-16 DIAGNOSIS — R269 Unspecified abnormalities of gait and mobility: Secondary | ICD-10-CM

## 2017-02-16 DIAGNOSIS — G35 Multiple sclerosis: Secondary | ICD-10-CM | POA: Diagnosis not present

## 2017-02-16 DIAGNOSIS — B182 Chronic viral hepatitis C: Secondary | ICD-10-CM

## 2017-02-16 DIAGNOSIS — G5601 Carpal tunnel syndrome, right upper limb: Secondary | ICD-10-CM

## 2017-02-16 MED ORDER — DALFAMPRIDINE ER 10 MG PO TB12: 10.0000 mg | ORAL_TABLET | Freq: Two times a day (BID) | ORAL | 11 refills | Status: DC

## 2017-02-16 MED ORDER — METHYLPREDNISOLONE 4 MG PO TBPK: ORAL_TABLET | ORAL | 0 refills | Status: DC

## 2017-02-16 NOTE — Progress Notes (Signed)
[facility]  PATIENT: [name] DOB: 06/16/1961  REFERRING DOCTOR OR PCP: Dr. Fleet Contras SOURCE: patient, records fromPCP, ED, ID, Infusion center.   Imaging and lab results, MRI images on PACS personally reviewed  _________________________________   HISTORICAL  CHIEF COMPLAINT:  Chief Complaint  Patient presents with  . Multiple Sclerosis    Recently stopped Tysabri due to positive JCV ab status.  Is not a good candidate for Lemtrada due to hepatitis infection.  Here to discuss other tx. optons/fim    HISTORY OF PRESENT ILLNESS:  [name] is a 56 yo man who diagnosed with MS in 2005.    He's been on Tysabri but recently converted to JCV antibody negative to high titer positive (1.7).   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   For those reasons, I do not think ocrelizumab would be a good idea. He is concerned about the risks of Lemtrada.  Therefore, Tecfidera may be a safer option for him. He signed the service request form today and we went over the risks and benefits.  His symptoms are doing about the same as his last visit except for pain at the right wrist and pain and numbness in the right hand. The symptoms have woke up a few times at night. He feels a right hand is slightly weak.  Sensation:    He notes numbness and pain in the right arm that has worsened recently.   He gets a benefit from gabapentin.  Gait/strength:    He has right greater than left leg weakness, clumsiness and ataxia. He uses a walker for short distances and an electric wheelchair for long distances.     He also notes problems with coordination of the right arm. He has spasticity in the right arm and leg.   He takes baclofen 20 mg 4 times a day with benefit.    He is on Ampyra and notes continued benefit.  He has no more refills. ILast script was written by  Dr. Hale Bogus.  Bladder:   He denies bladder or bowel difficulties.  Vision: He notes double vision.   He has not had optic neuritis. In the past, when he would get cluster headaches he would have visual changes  Mood/Cognition:   He has had some depression and anxiety. He gets a benefit from Zoloft. There are mild cognitive changes.   Specifically he notes decreased focus and attention and has had some difficulties with short-term memory and verbal fluency.  Fatigue/sleep: He does not have much difficulty with fatigue. He generally sleeps well at night.   He falls asleep much better when he takes a 3 mg Lunesta at night. He also takes one of his gabapentin is at night.  Headaches:  He continues to have frequent headaches. He gets cluster headaches and is on Topamax 25 mg by mouth twice a day and Keppra 500 mg by mouth twice a day with benefit.  Other:  He has latent tuberculosis.  He is hep C positive.   He is on [place].   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   He saw Orthopedics and then saw Dr. Sandria Manly who performed an MRI.   It was consistent with MS and he started Copaxone. Then due to progression he was placed on  Rebif and Avonex.[name]    He was referred to Dr. Leotis Shames who placed him on Tysabri a few years ago due to more exacerbations. He is getting his infusions at [facility] and then at [facility].    His last infusion was in February and the next infusion is due 01/30/17.    I personally reviewed MRIs of the brain dated 12/17/2016, 12/12/2010, 03/31/2012 and MRI of the cervical spine dated 11/17/2016.   In the brain, he has a plaque in the posterior right pons and one in the right cerebellar hemisphere and multiple plaques in the white matter. Most of these are nonspecific though a few are periventricular. None of the foci have enhanced on any of the MRIs are reviewed. An MRI of the cervical spine shows multiple  foci within the spinal cord (central at C2-C3, bilateral at C3-C4, to the left at C5, to the right at C6, to the right at T1 there are mild degenerative changes causing mild spinal stenosis at C3-C4 and C4C5. There is foraminal narrowing at C3-C4 and at C5-C6.  REVIEW OF SYSTEMS: Constitutional: No fevers, chills, sweats, or change in appetite.   Insomnia helped by med's Eyes: No visual changes, double vision, eye pain Ear, nose and throat: No hearing loss, ear pain, nasal congestion, sore throat Cardiovascular: No chest pain, palpitations Respiratory: No shortness of breath at rest or with exertion.   No wheezes GastrointestinaI: No nausea, vomiting, diarrhea, abdominal pain, fecal incontinence Genitourinary: No dysuria, urinary retention or frequency.  No nocturia. Musculoskeletal: No neck pain, back pain Integumentary: No rash, pruritus, skin lesions Neurological: as above Psychiatric:  As above. Endocrine: No palpitations, diaphoresis, change in appetite, change in weigh or increased thirst Hematologic/Lymphatic: No anemia, purpura, petechiae. Allergic/Immunologic: No itchy/runny eyes, nasal congestion, recent allergic reactions, rashes  ALLERGIES: No Known Allergies  HOME MEDICATIONS:  Current Outpatient Prescriptions:  .  albuterol (PROVENTIL HFA;VENTOLIN HFA) 108 (90 Base) MCG/ACT inhaler, Inhale 2 puffs into the lungs every 4 (four) hours as needed for wheezing or shortness of breath., Disp: 1 Inhaler, Rfl: 0 .  baclofen (LIORESAL) 20 MG tablet, Take 20 mg by mouth 4 (four) times daily. , Disp: , Rfl:  .  dalfampridine 10 MG TB12, Take 1 tablet (10 mg total) by mouth 2 (two) times daily., Disp: 60 tablet, Rfl: 11 .  Eszopiclone (ESZOPICLONE) 3 MG TABS, Take 3 mg by mouth at bedtime as needed (for sleep). , Disp: , Rfl:  .  gabapentin (NEURONTIN) 600 MG tablet, Take 600 mg by mouth 3 (three) times daily. , Disp: , Rfl:  .  hydrochlorothiazide (HYDRODIURIL) 25 MG tablet, Take  25 mg by mouth daily., Disp: , Rfl: 5 .  levETIRAcetam (KEPPRA) 500 MG tablet, Take 500 mg by mouth 2 (two) times daily., Disp: , Rfl: 5 .  lisinopril-hydrochlorothiazide (PRINZIDE,ZESTORETIC) 10-12.5 MG tablet, Take 1 tablet by mouth daily., Disp: , Rfl: 5 .  omeprazole (PRILOSEC) 40 MG capsule, Take 40 mg by mouth daily., Disp: , Rfl: 4 .  oxyCODONE-acetaminophen (PERCOCET) 10-325 MG tablet, Take 1 tablet by mouth every 4 (four) hours as needed for pain. , Disp: , Rfl:  .  polyethylene glycol (MIRALAX / GLYCOLAX) packet, Take 17 g by mouth daily., Disp: 14 each, Rfl: 0 .  promethazine (PHENERGAN) 25 MG tablet, Take 1 tablet (25 mg total) by mouth every 6 (six) hours as needed (headache)., Disp: 10 tablet, Rfl: 0 .  senna-docusate (SENOKOT-S) 8.6-50 MG tablet, Take 1 tablet by mouth  2 (two) times daily., Disp: , Rfl:  .  sertraline (ZOLOFT) 50 MG tablet, Take 50 mg by mouth daily. , Disp: , Rfl:  .  topiramate (TOPAMAX) 25 MG tablet, Take 1 tablet (25 mg total) by mouth 2 (two) times daily., Disp: 30 tablet, Rfl: 0 .  triamterene-hydrochlorothiazide (DYAZIDE) 37.5-25 MG capsule, Take 1 capsule by mouth daily as needed (blood pressure/edema). , Disp: , Rfl: 5 .  verapamil (CALAN-SR) 120 MG CR tablet, Take 1 tablet (120 mg total) by mouth at bedtime., Disp: 30 tablet, Rfl: 0 .  warfarin (COUMADIN) 5 MG tablet, Take 5 mg by mouth daily., Disp: , Rfl:  .  Alemtuzumab (LEMTRADA) 12 MG/1.2ML SOLN, Inject 12 mg into the vein., Disp: , Rfl:  .  methylPREDNISolone (MEDROL DOSEPAK) 4 MG TBPK tablet, Take 6 po x 1 d, then 5 po x 1d, then 4 po x 1d, then 3 po x 1d, then 2 po x 1d, then 1 po x 1d., Disp: 21 tablet, Rfl: 0 No current facility-administered medications for this visit.   Facility-Administered Medications Ordered in Other Visits:  .  0.9 %  sodium chloride infusion, , Intravenous, Once, Jeralyn Ruths, MD .  0.9 %  sodium chloride infusion, , Intravenous, Once, Jeralyn Ruths, MD .   acetaminophen (TYLENOL) tablet 1,000 mg, 1,000 mg, Oral, Once, Jeralyn Ruths, MD .  acetaminophen (TYLENOL) tablet 1,000 mg, 1,000 mg, Oral, Once, Jeralyn Ruths, MD .  acetaminophen (TYLENOL) tablet 1,000 mg, 1,000 mg, Oral, Once, Jeralyn Ruths, MD .  natalizumab (TYSABRI) 300 mg in sodium chloride 0.9 % 100 mL IVPB, 300 mg, Intravenous, Once, Jeralyn Ruths, MD .  natalizumab (TYSABRI) 300 mg in sodium chloride 0.9 % 100 mL IVPB, 300 mg, Intravenous, Once, Jeralyn Ruths, MD .  natalizumab (TYSABRI) 300 mg in sodium chloride 0.9 % 100 mL IVPB, 300 mg, Intravenous, Once, Jeralyn Ruths, MD .  sodium chloride 0.9 % injection 10 mL, 10 mL, Intracatheter, PRN, Loann Quill, NP, 10 mL at 11/07/15 1012  PAST MEDICAL HISTORY: Past Medical History:  Diagnosis Date  . Abdominal pain, unspecified site   . Anxiety   . Arthritis   . Benign paroxysmal positional vertigo   . Chronic back pain   . Chronic pain syndrome 01/25/2008  . Cluster headache   . Depression    takes Zoloft daily  . DVT (deep venous thrombosis) (HCC)    in the left arm '09  . Gait abnormality    "uses mobile wheelchair, but is ambulatory"  . Gallstones 02/17/2009   resolved after gallbladder surgery.  [name] GERD (gastroesophageal reflux disease)    takes Omeprazole as needed  . Headache(784.0)    cluster headaches frequently-takes Topamax daily  . History of colonoscopy   . HTN (hypertension)    takes Lisinopril,Verapamil,and Triamterene HCTZ daily  . Insomnia 11/06/2008  . Joint pain   . Joint swelling    03-07-16 "swelling of right wrist" "after a fall-xray done 03-06-16 "no fractures".  . Memory loss    no an issue at present 03-07-16  . Multiple sclerosis (HCC)    Dx. 2005 - Dr. Tinnie Gens follows LOV 4'17 tx. Tysabri monthly IV-[facility] , [place].  [name] Nonspecific elevation of levels of transaminase or lactic acid dehydrogenase (LDH)   . Other specified visual disturbances   .  Other syndromes affecting cervical region   . Pneumonia 2009  . Trigeminal neuralgia     history"  Multiple sclerosis"    PAST SURGICAL HISTORY: Past Surgical History:  Procedure Laterality Date  . CHOLECYSTECTOMY  02/20/2009  . COLONOSCOPY WITH PROPOFOL N/A 03/17/2016   Procedure: COLONOSCOPY WITH PROPOFOL;  Surgeon: [name], MD;  Location: [facility] END[facility]COPY;  Service: Endoscopy;  Laterality: N/A;  . PORT A CATH REVISION N/A 07/06/2015   Procedure: Removal and replacement of PORT A CATH;  Surgeon: [name], MD;  Location: [facility] OR;  Service: General;  Laterality: N/A;  . PORTACATH PLACEMENT N/A 03/27/2014   Procedure: INSERTION PORT-A-CATH;  Surgeon: [name], MD;  Location: [facility] OR;  Service: General;  Laterality: N/A;    FAMILY HISTORY: Family History  Problem Relation Age of Onset  . Cancer Father   . Diabetes Mother     SOCIAL HISTORY:  Social History   Social History  . Marital status: Divorced    Spouse name: N/A  . Number of children: 3  . Years of education: 20 th   Occupational History  .      Disabled   Social History Main Topics  . Smoking status: Former Smoker    Packs/day: 1.00    Years: 38.00    Types: Cigarettes  . Smokeless tobacco: Never Used     Comment: cutting back  . Alcohol use 0.0 oz/week     Comment: occasional  . Drug use: No     Comment: Quit 2011  . Sexual activity: Not on file   Other Topics Concern  . Not on file   Social History Narrative   Patient is divorced. Patient is disabled. Because of his MS. Patient has 11 th grade education. 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    Right handed.   Caffeine- One daily.     PHYSICAL EXAM  Vitals:   02/16/17 1349  BP: (!) 179/83  Pulse: 87  Resp: 18  Height: 5\' 9"  (1.753 m)    There is no height or weight on file to calculate BMI.   General: The patient is well-developed and well-nourished and in no acute distress In an electric  wheelchair.  Neurologic Exam  Mental status: The patient is alert and oriented x 3 at the time of the examination. The patient has apparent normal recent and remote memory, with an apparently normal attention span and concentration ability.   Speech is normal.  Cranial nerves: Extraocular movements are full. Facial strength and sensation is normal. Trapezius and sternocleidomastoid strength is normal. No dysarthria is noted.  The tongue is midline, and the patient has symmetric elevation of the soft palate. No obvious hearing deficits are noted.  Motor:  Muscle bulk is normal.   Tone is mildly increased in legs, R>L.  Strength is  5 / 5 in arms except for 4/5 right APB. He has mildly reduced RAM on the right. Strength is 4+-5/5 in the left leg and 4- to 4/5 in the right leg.  Sensory: Sensory testing is intact to pinprick, soft touch and vibration sensation in all 4 extremities except for slight reduced sensation over the thenar eminence on the right.  Coordination: Cerebellar testing reveals slightly reduced right finger to nose and poor right heel-to-shin and mildly reduced left heel-to-shin  Gait and station: Station is wide.   Gait is wide and requires unilateral support.  Severe right foot and leg drop.  He circulocates leg as he walks..   Romberg is positive   Reflexes: Deep tendon reflexes are increased in  the legs with spread at the knees. There are a few beats of clonus at the right ankle but not the left.   Other:  He has a Tinel sign over the right wrist with radiation towards the thumb.    DIAGNOSTIC DATA (LABS, IMAGING, TESTING) - I reviewed patient records, labs, notes, testing and imaging myself where available.  Lab Results  Component Value Date   WBC 7.9 01/22/2017   HGB 13.5 12/17/2016   HCT 40.9 01/22/2017   MCV 80 01/22/2017   PLT 353 01/22/2017      Component Value Date/Time   NA 139 02/10/2017 1409   NA 139 02/02/2017 1508   K 3.7 02/10/2017 1409   CL 102  02/10/2017 1409   CO2 23 02/10/2017 1409   GLUCOSE 100 (H) 02/10/2017 1409   BUN 22 02/10/2017 1409   BUN 17 02/02/2017 1508   CREATININE 1.04 02/10/2017 1409   CALCIUM 9.4 02/10/2017 1409   PROT 8.4 (H) 02/10/2017 1409   ALBUMIN 4.3 02/10/2017 1409   AST 69 (H) 02/10/2017 1409   ALT 123 (H) 02/10/2017 1409   ALT 40 09/05/2016 1048   ALKPHOS 92 02/10/2017 1409   BILITOT 0.4 02/10/2017 1409   GFRNONAA 80 02/10/2017 1409   GFRAA >89 02/10/2017 1409   No results found for: CHOL, HDL, LDLCALC, LDLDIRECT, TRIG, CHOLHDL Lab Results  Component Value Date   HGBA1C 5.9 (H) 11/16/2016        ASSESSMENT AND PLAN  Multiple sclerosis (HCC)  Chronic pain syndrome  Chronic hepatitis C without hepatic coma (HCC)  Gait disturbance  Right carpal tunnel syndrome  1.   Tecfidera 240 mg po bid fro MS DMT 2.    Ampyra 10 mg bid (fill out new form . 3.   Continue other med's 4.   rtc 4 onthd sooner if any new or worsening symptoms  Bernardine Langworthy A. Epimenio Foot, MD, PhD 02/16/2017, 2:30 PM Certified in Neurology, Clinical Neurophysiology, Sleep Medicine, Pain Medicine and Neuroimaging  [facility] [address] [place], [place] [address] [phone]

## 2017-02-17 ENCOUNTER — Encounter: Payer: Self-pay | Admitting: *Deleted

## 2017-02-18 ENCOUNTER — Other Ambulatory Visit: Payer: Self-pay | Admitting: Psychiatry

## 2017-02-18 DIAGNOSIS — G35 Multiple sclerosis: Secondary | ICD-10-CM

## 2017-02-19 LAB — HCV RNA NS5B DRUG RESISTANCE: HCV NS5B Subtype: NOT DETECTED

## 2017-02-19 LAB — HCV VIRAL RNA GEN3 NS5A DRUG RESIST: HCV NS5a Subtype: NOT DETECTED

## 2017-02-24 ENCOUNTER — Inpatient Hospital Stay: Payer: 59

## 2017-02-24 ENCOUNTER — Inpatient Hospital Stay: Payer: Medicare Other | Attending: Oncology

## 2017-02-24 DIAGNOSIS — Z452 Encounter for adjustment and management of vascular access device: Secondary | ICD-10-CM | POA: Diagnosis not present

## 2017-02-24 DIAGNOSIS — G35 Multiple sclerosis: Secondary | ICD-10-CM | POA: Diagnosis present

## 2017-02-24 DIAGNOSIS — Z95828 Presence of other vascular implants and grafts: Secondary | ICD-10-CM

## 2017-02-24 MED ORDER — HEPARIN SOD (PORK) LOCK FLUSH 100 UNIT/ML IV SOLN
500.0000 [IU] | Freq: Once | INTRAVENOUS | Status: AC
  Administered 2017-02-24: 500 [IU] via INTRAVENOUS
  Filled 2017-02-24: qty 5

## 2017-02-24 MED ORDER — SODIUM CHLORIDE 0.9% FLUSH
10.0000 mL | INTRAVENOUS | Status: DC | PRN
  Administered 2017-02-24: 10 mL via INTRAVENOUS
  Filled 2017-02-24: qty 10

## 2017-02-27 ENCOUNTER — Ambulatory Visit: Payer: Medicare HMO

## 2017-03-03 ENCOUNTER — Ambulatory Visit (INDEPENDENT_AMBULATORY_CARE_PROVIDER_SITE_OTHER): Payer: 59 | Admitting: Pharmacist Clinician (PhC)/ Clinical Pharmacy Specialist

## 2017-03-03 ENCOUNTER — Inpatient Hospital Stay: Payer: Medicare HMO

## 2017-03-03 DIAGNOSIS — B182 Chronic viral hepatitis C: Secondary | ICD-10-CM | POA: Diagnosis not present

## 2017-03-03 NOTE — Progress Notes (Signed)
HPI: [name] is a 56 y.o. 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  Lab Results  Component Value Date   HCVGENOTYPE 2b 07/08/2016    Allergies: No Known Allergies  Past Medical History: Past Medical History:  Diagnosis Date  . Abdominal pain, unspecified site   . Anxiety   . Arthritis   . Benign paroxysmal positional vertigo   . Chronic back pain   . Chronic pain syndrome 01/25/2008  . Cluster headache   . Depression    takes Zoloft daily  . DVT (deep venous thrombosis) (HCC)    in the left arm '09  . Gait abnormality    "uses mobile wheelchair, but is ambulatory"  . Gallstones 02/17/2009   resolved after gallbladder surgery.  [name] GERD (gastroesophageal reflux disease)    takes Omeprazole as needed  . Headache(784.0)    cluster headaches frequently-takes Topamax daily  . History of colonoscopy   . HTN (hypertension)    takes Lisinopril,Verapamil,and Triamterene HCTZ daily  . Insomnia 11/06/2008  . Joint pain   . Joint swelling    03-07-16 "swelling of right wrist" "after a fall-xray done 03-06-16 "no fractures".  . Memory loss    no an issue at present 03-07-16  . Multiple sclerosis (HCC)    Dx. 2005 - Dr. Tinnie Gens follows LOV 4'17 tx. Tysabri monthly IV-[facility] , [place].  [name] Nonspecific elevation of levels of transaminase or lactic acid dehydrogenase (LDH)   . Other specified visual disturbances   . Other syndromes affecting cervical region   . Pneumonia 2009  . Trigeminal neuralgia     history" Multiple sclerosis"    Social History: Social History   Social History  . Marital status: Divorced    Spouse name: N/A  . Number of children: 3  . Years of education: 38 th   Occupational History  .      Disabled   Social History Main Topics  . Smoking status: Former Smoker    Packs/day: 1.00    Years: 38.00    Types: Cigarettes  . Smokeless tobacco: Never Used     Comment: cutting back  . Alcohol use 0.0  oz/week     Comment: occasional  . Drug use: No     Comment: Quit 2011  . Sexual activity: Not on file   Other Topics Concern  . Not on file   Social History Narrative   Patient is divorced. Patient is disabled. Because of his MS. Patient has 11 th grade education. 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    Right handed.   Caffeine- One daily.    Labs: Hepatitis B Surface Ag (no units)  Date Value  05/28/2016 Negative   HCV Ab (no units)  Date Value  07/08/2016 REACTIVE (A)    Lab Results  Component Value Date   HCVGENOTYPE 2b 07/08/2016    Hepatitis C RNA quantitative Latest Ref Rng & Units 12/30/2016 07/08/2016 07/08/2016  HCV Quantitative NOT DETECTED IU/mL 17,600,000(H) 1,610,960(A) 5,409,811(B)  HCV Quantitative Log NOT DETECTED Log IU/mL 7.25(H) 6.90(H) 6.90(H)    AST (U/L)  Date Value  02/10/2017 69 (H)  12/17/2016 27  12/03/2016 37   ALT (U/L)  Date Value  02/10/2017 123 (H)  12/17/2016 141 (H)  12/03/2016 31  09/05/2016 40   INR (no units)  Date Value  12/19/2016 1.33  12/18/2016 1.40  12/17/2016 1.97    CrCl: CrCl cannot be  calculated (Unknown ideal weight.).  Fibrosis Score: F4 as assessed by Fibrosure   Child-Pugh Score: A  Previous Treatment Regimen: Epclusa   Assessment: [name] presents today after failing treatment with Epclusa (started 09/2016). He had issues with headaches on Epclusa, though he does have a history of cluster headaches. He had poor compliance with office visits and missed between one and two weeks of therapy. He states he only missed a week because his headaches were so bad that his neurologist told him to stop therapy.   [name] was particularly upset about failing, repeatedly mentioning that he only missed a week and should not have failed based on this. He wanted to know why he could have failed, and we told him that there is roughly a 2% risk of unexplainable failure in the treated  population. He is also taking omeprazole 40mg  daily, which is known to decrease the levels of velpatasvir. We told him that we will reduce the dose of omeprazole and he should space it out from his next treatment. He simply said he would stop taking the omeprazole entirely.   Of note, he was on oxcarbazepine prior to starting Epclusa; however, the neurologist agreed to stop this medication. The patient is now back on oxcarbazepine. When asked if he took it during his Epclusa, he hesitated then denied taking any oxcarbazepine. To start him on a new HCV regimen, he will have to stop taking oxcarbazepine and we will have to wait until the effects of oxcarbazepine on enzyme induction have diminished ~2 weeks).   [name] has not received any MS therapy since prior to his HCV treatment. 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 Neurology wanted start him on Ocrevus and lemtrada, but he has to finish HCV treatment first.   Since he is treatment experienced, we will pursue starting him on Vosevi for 3 months. Additionally, we told him that he will need to come back monthly for labs and follow-up.   Recommendations: Stop taking Trileptal Apply for Vosevi today Return to clinic in 2 weeks  Alfredo Bach, Cleotis Nipper, PharmD Clinical Pharmacy Resident [phone] (Pager) 03/03/2017 11:29 AM

## 2017-03-03 NOTE — Progress Notes (Signed)
He has been a very difficult pt to deal with. I really wonder if he was taking oxcarbazepine with his Dorita Fray even though he denied it. Vosevi may be one of the few options we have left. Told him that today. If he is approved, we are going to give him pre-dose tylenol with every dose of Vosevi to avoid this cluster headache issue. PA was filed through [employer] today.

## 2017-03-03 NOTE — Patient Instructions (Signed)
Stop oxcarbezepine We will apply for Vosevi for retreating hep C We'll try to start in about 2 wks to let the oxcabezepine washout

## 2017-03-04 ENCOUNTER — Ambulatory Visit
Admission: RE | Admit: 2017-03-04 | Discharge: 2017-03-04 | Disposition: A | Discharge status: Home / Self Care | Payer: 59 | Source: Ambulatory Visit | Attending: Internal Medicine | Admitting: Internal Medicine

## 2017-03-04 ENCOUNTER — Other Ambulatory Visit: Payer: Self-pay

## 2017-03-04 DIAGNOSIS — B182 Chronic viral hepatitis C: Secondary | ICD-10-CM

## 2017-03-05 ENCOUNTER — Inpatient Hospital Stay: Payer: Medicare HMO

## 2017-03-05 MED ORDER — SOFOSBUV-VELPATASV-VOXILAPREV 400-100-100 MG PO TABS: 1.0000 | ORAL_TABLET | Freq: Every day | ORAL | 84 refills | Status: DC

## 2017-03-05 MED FILL — VOSEVI 400-100-100 MG TAB: 400-100-100 | 28 days supply | Qty: 28 | Fill #0

## 2017-03-05 NOTE — Addendum Note (Signed)
Addended by: Nicholes Calamity on: 03/05/2017 10:39 AM   Modules accepted: Orders

## 2017-03-06 ENCOUNTER — Ambulatory Visit: Payer: Medicare Other

## 2017-03-06 ENCOUNTER — Ambulatory Visit: Payer: Medicare HMO

## 2017-03-09 ENCOUNTER — Ambulatory Visit: Payer: Self-pay

## 2017-03-09 ENCOUNTER — Other Ambulatory Visit: Payer: Self-pay

## 2017-03-10 ENCOUNTER — Encounter: Payer: Self-pay | Admitting: Pharmacy Technician

## 2017-03-16 ENCOUNTER — Ambulatory Visit: Payer: Self-pay

## 2017-03-17 ENCOUNTER — Inpatient Hospital Stay: Payer: Medicare Other | Attending: Internal Medicine

## 2017-03-17 ENCOUNTER — Telehealth: Payer: Self-pay | Admitting: Neurology

## 2017-03-17 VITALS — BP 113/75 | HR 83 | Temp 98.1°F | Resp 18

## 2017-03-17 DIAGNOSIS — G35 Multiple sclerosis: Secondary | ICD-10-CM | POA: Diagnosis not present

## 2017-03-17 DIAGNOSIS — Z79899 Other long term (current) drug therapy: Secondary | ICD-10-CM | POA: Diagnosis not present

## 2017-03-17 MED ORDER — SODIUM CHLORIDE 0.9 % IJ SOLN
10.0000 mL | Freq: Once | INTRAMUSCULAR | Status: AC
  Administered 2017-03-17: 10 mL
  Filled 2017-03-17: qty 10

## 2017-03-17 MED ORDER — SODIUM CHLORIDE 0.9 % IV SOLN
Freq: Once | INTRAVENOUS | Status: AC
  Administered 2017-03-17: 14:00:00 via INTRAVENOUS
  Filled 2017-03-17: qty 1000

## 2017-03-17 MED ORDER — SODIUM CHLORIDE 0.9 % IV SOLN
Freq: Once | INTRAVENOUS | Status: AC
  Administered 2017-03-17: 14:00:00 via INTRAVENOUS
  Filled 2017-03-17: qty 250

## 2017-03-17 MED ORDER — HEPARIN SOD (PORK) LOCK FLUSH 100 UNIT/ML IV SOLN
500.0000 [IU] | Freq: Once | INTRAVENOUS | Status: AC
  Administered 2017-03-17: 500 [IU]

## 2017-03-17 NOTE — Patient Instructions (Signed)
Methylprednisolone Suspension for Injection What is this medicine? METHYLPREDNISOLONE (meth ill pred NISS oh lone) is a corticosteroid. It is commonly used to treat inflammation of the skin, joints, lungs, and other organs. Common conditions treated include asthma, allergies, and arthritis. It is also used for other conditions, such as blood disorders and diseases of the adrenal glands. This medicine may be used for other purposes; ask your health care provider or pharmacist if you have questions. COMMON BRAND NAME(S): Depmedalone-40, Depmedalone-80, Depo-Medrol What should I tell my health care provider before I take this medicine? They need to know if you have any of these conditions: -Cushing's syndrome -eye disease, vision problems -diabetes -glaucoma -heart disease -high blood pressure -infection (especially a virus infection such as chickenpox, cold sores, or herpes) -liver disease -mental illness -myasthenia gravis -osteoporosis -recently received or scheduled to receive a vaccine -seizures -stomach or intestine problems -thyroid disease -an unusual or allergic reaction to lactose, methylprednisolone, other medicines, foods, dyes, or preservatives -pregnant or trying to get pregnant -breast-feeding How should I use this medicine? This medicine is for injection into a muscle, joint, or other tissue. It is given by a health care professional in a hospital or clinic setting. Talk to your pediatrician regarding the use of this medicine in children. While this drug may be prescribed for selected conditions, precautions do apply. Overdosage: If you think you have taken too much of this medicine contact a poison control center or emergency room at once. NOTE: This medicine is only for you. Do not share this medicine with others. What if I miss a dose? This does not apply. What may interact with this medicine? Do not take this medicine with any of the following  medications: -alefacept -echinacea -iopamidol -live virus vaccines -metyrapone -mifepristone This medicine may also interact with the following medications: -amphotericin B -aspirin and aspirin-like medicines -certain antibiotics like erythromycin, clarithromycin, troleandomycin -certain medicines for diabetes -certain medicines for fungal infections like ketoconazole -certain medicines for seizures like carbamazepine, phenobarbital, phenytoin -certain medicines that treat or prevent blood clots like warfarin -cyclosporine -digoxin -diuretics -male hormones, like estrogens and birth control pills -isoniazid -NSAIDs, medicines for pain inflammation, like ibuprofen or naproxen -other medicines for myasthenia gravis -rifampin -vaccines This list may not describe all possible interactions. Give your health care provider a list of all the medicines, herbs, non-prescription drugs, or dietary supplements you use. Also tell them if you smoke, drink alcohol, or use illegal drugs. Some items may interact with your medicine. What should I watch for while using this medicine? Tell your doctor or healthcare professional if your symptoms do not start to get better or if they get worse. Do not stop taking except on your doctor's advice. You may develop a severe reaction. Your doctor will tell you how much medicine to take. Your condition will be monitored carefully while you are receiving this medicine. This medicine may increase your risk of getting an infection. Tell your doctor or health care professional if you are around anyone with measles or chickenpox, or if you develop sores or blisters that do not heal properly. This medicine may affect blood sugar levels. If you have diabetes, check with your doctor or health care professional before you change your diet or the dose of your diabetic medicine. Tell your doctor or health care professional right away if you have any change in your  eyesight. Using this medicine for a long time may increase your risk of low bone mass. Talk to your doctor about   bone health. What side effects may I notice from receiving this medicine? Side effects that you should report to your doctor or health care professional as soon as possible: -allergic reactions like skin rash, itching or hives, swelling of the face, lips, or tongue -bloody or tarry stools -changes in vision -hallucination, loss of contact with reality -muscle cramps -muscle pain -palpitations -signs and symptoms of high blood sugar such as dizziness; dry mouth; dry skin; fruity breath; nausea; stomach pain; increased hunger or thirst; increased urination -signs and symptoms of infection like fever or chills; cough; sore throat; pain or trouble passing urine -trouble passing urine or change in the amount of urine Side effects that usually do not require medical attention (report to your doctor or health care professional if they continue or are bothersome): -changes in emotions or mood -constipation -diarrhea -excessive hair growth on the face or body -headache -nausea, vomiting -pain, redness, or irritation at site where injected -trouble sleeping -weight gain This list may not describe all possible side effects. Call your doctor for medical advice about side effects. You may report side effects to FDA at 1-800-FDA-1088. Where should I keep my medicine? This drug is given in a hospital or clinic and will not be stored at home. NOTE: This sheet is a summary. It may not cover all possible information. If you have questions about this medicine, talk to your doctor, pharmacist, or health care provider.  2018 Elsevier/Gold Standard (2016-01-03 16:17:02)  

## 2017-03-17 NOTE — Telephone Encounter (Signed)
Brandy/[facility] [phone] called said the pt has appt today at 1:30 for infusion/Solumedrol, his insurance has denied it.Please call asap. She will fax info she has

## 2017-03-17 NOTE — Telephone Encounter (Signed)
Left message for Brandi to call/fim

## 2017-03-18 ENCOUNTER — Ambulatory Visit
Admission: RE | Admit: 2017-03-18 | Discharge: 2017-03-18 | Disposition: A | Discharge status: Home / Self Care | Payer: 59 | Source: Ambulatory Visit | Attending: Psychiatry | Admitting: Psychiatry

## 2017-03-18 ENCOUNTER — Telehealth: Payer: Self-pay | Admitting: *Deleted

## 2017-03-18 ENCOUNTER — Other Ambulatory Visit: Payer: Self-pay | Admitting: Oncology

## 2017-03-18 DIAGNOSIS — G35 Multiple sclerosis: Secondary | ICD-10-CM

## 2017-03-18 MED ORDER — METHYLPREDNISOLONE SODIUM SUCC 500 MG IJ SOLR: 1000.0000 mg | Freq: Every day | INTRAMUSCULAR | Status: DC

## 2017-03-18 NOTE — Telephone Encounter (Signed)
I have spoken with [name] to clarify MS tx.  He is being treated for chronic Hep. C,so is not able to immed. start Ocrevus.  [facility] rec. Tecfidera, and completed srf has been sent to [facility].  Pt. has declined Tecfidera, sts. he prefers to complete tx. for Hep C. and then start Ocrevus.  Update passed along to RAS and he is agreeable with this.  Pt. will let us know once Hep C tx. is complete/fim

## 2017-03-19 ENCOUNTER — Ambulatory Visit: Payer: Self-pay

## 2017-03-19 MED ORDER — SODIUM CHLORIDE 0.9 % IV SOLN
Freq: Once | INTRAVENOUS | Status: AC
  Administered 2017-03-20: 11:00:00 via INTRAVENOUS
  Filled 2017-03-19: qty 250

## 2017-03-20 ENCOUNTER — Inpatient Hospital Stay: Payer: Medicare Other

## 2017-03-20 VITALS — BP 118/78 | HR 80 | Temp 98.1°F | Resp 18

## 2017-03-20 DIAGNOSIS — G35 Multiple sclerosis: Secondary | ICD-10-CM

## 2017-03-20 MED ORDER — HEPARIN SOD (PORK) LOCK FLUSH 100 UNIT/ML IV SOLN
500.0000 [IU] | Freq: Once | INTRAVENOUS | Status: AC
  Administered 2017-03-20: 500 [IU] via INTRAVENOUS

## 2017-03-20 MED ORDER — SODIUM CHLORIDE 0.9 % IV SOLN
Freq: Once | INTRAVENOUS | Status: AC
  Administered 2017-03-20: 11:00:00 via INTRAVENOUS
  Filled 2017-03-20: qty 1000

## 2017-03-20 MED ORDER — SODIUM CHLORIDE 0.9% FLUSH
10.0000 mL | INTRAVENOUS | Status: DC | PRN
  Filled 2017-03-20: qty 10

## 2017-03-23 ENCOUNTER — Inpatient Hospital Stay: Payer: Medicare Other

## 2017-03-23 VITALS — BP 115/77 | HR 73 | Temp 97.7°F | Resp 20

## 2017-03-23 DIAGNOSIS — G35 Multiple sclerosis: Secondary | ICD-10-CM | POA: Diagnosis not present

## 2017-03-23 MED ORDER — SODIUM CHLORIDE 0.9 % IV SOLN
Freq: Once | INTRAVENOUS | Status: DC
  Filled 2017-03-23: qty 250

## 2017-03-23 MED ORDER — SODIUM CHLORIDE 0.9 % IV SOLN
Freq: Once | INTRAVENOUS | Status: AC
  Administered 2017-03-23: 10:00:00 via INTRAVENOUS
  Filled 2017-03-23: qty 250

## 2017-03-23 MED ORDER — SODIUM CHLORIDE 0.9% FLUSH
10.0000 mL | INTRAVENOUS | Status: DC | PRN
  Administered 2017-03-23: 10 mL via INTRAVENOUS
  Filled 2017-03-23: qty 10

## 2017-03-23 MED ORDER — SODIUM CHLORIDE 0.9 % IV SOLN
Freq: Once | INTRAVENOUS | Status: AC
  Administered 2017-03-23: 10:00:00 via INTRAVENOUS
  Filled 2017-03-23: qty 1000

## 2017-03-23 MED ORDER — HEPARIN SOD (PORK) LOCK FLUSH 100 UNIT/ML IV SOLN
500.0000 [IU] | Freq: Once | INTRAVENOUS | Status: AC
Start: 2017-03-23 — End: 2017-03-23
  Administered 2017-03-23: 500 [IU] via INTRAVENOUS

## 2017-03-23 NOTE — Patient Instructions (Signed)
Methylprednisolone Solution for Injection °What is this medicine? °METHYLPREDNISOLONE (meth ill pred NISS oh lone) is a corticosteroid. It is commonly used to treat inflammation of the skin, joints, lungs, and other organs. Common conditions treated include asthma, allergies, and arthritis. It is also used for other conditions, such as blood disorders and diseases of the adrenal glands. °This medicine may be used for other purposes; ask your health care provider or pharmacist if you have questions. °COMMON BRAND NAME(S): A-Methapred, Solu-Medrol °What should I tell my health care provider before I take this medicine? °They need to know if you have any of these conditions: °-Cushing's syndrome °-eye disease, vision problems °-diabetes °-glaucoma °-heart disease °-high blood pressure °-infection (especially a virus infection such as chickenpox, cold sores, or herpes) °-liver disease °-mental illness °-myasthenia gravis °-osteoporosis °-recently received or scheduled to receive a vaccine °-seizures °-stomach or intestine problems °-thyroid disease °-an unusual or allergic reaction to lactose, methylprednisolone, other medicines, foods, dyes, or preservatives °-pregnant or trying to get pregnant °-breast-feeding °How should I use this medicine? °This medicine is for injection or infusion into a vein. It is also for injection into a muscle. It is given by a health care professional in a hospital or clinic setting. °Talk to your pediatrician regarding the use of this medicine in children. While this drug may be prescribed for selected conditions, precautions do apply. °Overdosage: If you think you have taken too much of this medicine contact a poison control center or emergency room at once. °NOTE: This medicine is only for you. Do not share this medicine with others. °What if I miss a dose? °This does not apply. °What may interact with this medicine? °Do not take this medicine with any of the following  medications: °-alefacept °-echinacea °-iopamidol °-live virus vaccines °-metyrapone °-mifepristone °This medicine may also interact with the following medications: °-amphotericin B °-aspirin and aspirin-like medicines °-certain antibiotics like erythromycin, clarithromycin, troleandomycin °-certain medicines for diabetes °-certain medicines for fungal infection like ketoconazole °-certain medicines for seizures like carbamazepine, phenobarbital, phenytoin °-certain medicines that treat or prevent blood clots like warfarin °-cyclosporine °-digoxin °-diuretics °-male hormones, like estrogens and birth control pills °-isoniazid °-NSAIDS, medicines for pain and inflammation, like ibuprofen or naproxen °-other medicines for myasthenia gravis °-rifampin °-vaccines °This list may not describe all possible interactions. Give your health care provider a list of all the medicines, herbs, non-prescription drugs, or dietary supplements you use. Also tell them if you smoke, drink alcohol, or use illegal drugs. Some items may interact with your medicine. °What should I watch for while using this medicine? °Tell your doctor or healthcare professional if your symptoms do not start to get better or if they get worse. Do not stop taking except on your doctor's advice. You may develop a severe reaction. Your doctor will tell you how much medicine to take. °Your condition will be monitored carefully while you are receiving this medicine. °This medicine may increase your risk of getting an infection. Tell your doctor or health care professional if you are around anyone with measles or chickenpox, or if you develop sores or blisters that do not heal properly. °This medicine may affect blood sugar levels. If you have diabetes, check with your doctor or health care professional before you change your diet or the dose of your diabetic medicine. °Tell your doctor or health care professional right away if you have any change in your  eyesight. °Using this medicine for a long time may increase your risk of low bone   mass. Talk to your doctor about bone health. °What side effects may I notice from receiving this medicine? °Side effects that you should report to your doctor or health care professional as soon as possible: °-allergic reactions like skin rash, itching or hives, swelling of the face, lips, or tongue °-bloody or tarry stools °-changes in vision °-hallucination, loss of contact with reality °-muscle cramps °-muscle pain °-palpitations °-signs and symptoms of high blood sugar such as dizziness; dry mouth; dry skin; fruity breath; nausea; stomach pain; increased hunger or thirst; increased urination °-signs and symptoms of infection like fever or chills; cough; sore throat; pain or trouble passing urine °-trouble passing urine or change in the amount of urine °Side effects that usually do not require medical attention (report to your doctor or health care professional if they continue or are bothersome): °-changes in emotions or mood °-constipation °-diarrhea °-excessive hair growth on the face or body °-headache °-nausea, vomiting °-pain, redness, or irritation at site where injected °-trouble sleeping °-weight gain °This list may not describe all possible side effects. Call your doctor for medical advice about side effects. You may report side effects to FDA at 1-800-FDA-1088. °Where should I keep my medicine? °This drug is given in a hospital or clinic and will not be stored at home. °NOTE: This sheet is a summary. It may not cover all possible information. If you have questions about this medicine, talk to your doctor, pharmacist, or health care provider. °© 2018 Elsevier/Gold Standard (2016-01-03 16:21:28) ° °

## 2017-03-24 ENCOUNTER — Inpatient Hospital Stay: Payer: Medicare Other

## 2017-03-24 VITALS — BP 118/77 | HR 78 | Temp 97.4°F | Resp 20

## 2017-03-24 DIAGNOSIS — G35 Multiple sclerosis: Secondary | ICD-10-CM

## 2017-03-24 MED ORDER — SODIUM CHLORIDE 0.9% FLUSH
10.0000 mL | INTRAVENOUS | Status: DC | PRN
  Administered 2017-03-24: 10 mL via INTRAVENOUS
  Filled 2017-03-24: qty 10

## 2017-03-24 MED ORDER — HEPARIN SOD (PORK) LOCK FLUSH 100 UNIT/ML IV SOLN
500.0000 [IU] | Freq: Once | INTRAVENOUS | Status: AC
  Administered 2017-03-24: 500 [IU] via INTRAVENOUS

## 2017-03-24 MED ORDER — HEPARIN SOD (PORK) LOCK FLUSH 100 UNIT/ML IV SOLN
INTRAVENOUS | Status: AC
  Filled 2017-03-24: qty 5

## 2017-03-24 MED ORDER — SODIUM CHLORIDE 0.9 % IV SOLN
Freq: Once | INTRAVENOUS | Status: AC
  Administered 2017-03-24: 10:00:00 via INTRAVENOUS
  Filled 2017-03-24 (×2): qty 250

## 2017-03-24 MED ORDER — SODIUM CHLORIDE 0.9 % IV SOLN
Freq: Once | INTRAVENOUS | Status: AC
  Administered 2017-03-25: 11:00:00 via INTRAVENOUS
  Filled 2017-03-24: qty 250

## 2017-03-25 ENCOUNTER — Inpatient Hospital Stay: Payer: Medicare Other

## 2017-03-25 VITALS — BP 116/78 | HR 77 | Temp 97.2°F | Resp 18

## 2017-03-25 DIAGNOSIS — G35 Multiple sclerosis: Secondary | ICD-10-CM

## 2017-03-25 MED ORDER — HEPARIN SOD (PORK) LOCK FLUSH 100 UNIT/ML IV SOLN
500.0000 [IU] | Freq: Once | INTRAVENOUS | Status: AC
  Administered 2017-03-25: 500 [IU] via INTRAVENOUS

## 2017-03-25 MED ORDER — SODIUM CHLORIDE 0.9% FLUSH
10.0000 mL | INTRAVENOUS | Status: DC | PRN
  Administered 2017-03-25: 10 mL via INTRAVENOUS
  Filled 2017-03-25: qty 10

## 2017-03-25 MED ORDER — HEPARIN SOD (PORK) LOCK FLUSH 100 UNIT/ML IV SOLN
INTRAVENOUS | Status: AC
Start: 2017-03-25 — End: 2017-03-25
  Filled 2017-03-25: qty 5

## 2017-03-25 NOTE — Patient Instructions (Signed)
Methylprednisolone Solution for Injection °What is this medicine? °METHYLPREDNISOLONE (meth ill pred NISS oh lone) is a corticosteroid. It is commonly used to treat inflammation of the skin, joints, lungs, and other organs. Common conditions treated include asthma, allergies, and arthritis. It is also used for other conditions, such as blood disorders and diseases of the adrenal glands. °This medicine may be used for other purposes; ask your health care provider or pharmacist if you have questions. °COMMON BRAND NAME(S): A-Methapred, Solu-Medrol °What should I tell my health care provider before I take this medicine? °They need to know if you have any of these conditions: °-Cushing's syndrome °-eye disease, vision problems °-diabetes °-glaucoma °-heart disease °-high blood pressure °-infection (especially a virus infection such as chickenpox, cold sores, or herpes) °-liver disease °-mental illness °-myasthenia gravis °-osteoporosis °-recently received or scheduled to receive a vaccine °-seizures °-stomach or intestine problems °-thyroid disease °-an unusual or allergic reaction to lactose, methylprednisolone, other medicines, foods, dyes, or preservatives °-pregnant or trying to get pregnant °-breast-feeding °How should I use this medicine? °This medicine is for injection or infusion into a vein. It is also for injection into a muscle. It is given by a health care professional in a hospital or clinic setting. °Talk to your pediatrician regarding the use of this medicine in children. While this drug may be prescribed for selected conditions, precautions do apply. °Overdosage: If you think you have taken too much of this medicine contact a poison control center or emergency room at once. °NOTE: This medicine is only for you. Do not share this medicine with others. °What if I miss a dose? °This does not apply. °What may interact with this medicine? °Do not take this medicine with any of the following  medications: °-alefacept °-echinacea °-iopamidol °-live virus vaccines °-metyrapone °-mifepristone °This medicine may also interact with the following medications: °-amphotericin B °-aspirin and aspirin-like medicines °-certain antibiotics like erythromycin, clarithromycin, troleandomycin °-certain medicines for diabetes °-certain medicines for fungal infection like ketoconazole °-certain medicines for seizures like carbamazepine, phenobarbital, phenytoin °-certain medicines that treat or prevent blood clots like warfarin °-cyclosporine °-digoxin °-diuretics °-male hormones, like estrogens and birth control pills °-isoniazid °-NSAIDS, medicines for pain and inflammation, like ibuprofen or naproxen °-other medicines for myasthenia gravis °-rifampin °-vaccines °This list may not describe all possible interactions. Give your health care provider a list of all the medicines, herbs, non-prescription drugs, or dietary supplements you use. Also tell them if you smoke, drink alcohol, or use illegal drugs. Some items may interact with your medicine. °What should I watch for while using this medicine? °Tell your doctor or healthcare professional if your symptoms do not start to get better or if they get worse. Do not stop taking except on your doctor's advice. You may develop a severe reaction. Your doctor will tell you how much medicine to take. °Your condition will be monitored carefully while you are receiving this medicine. °This medicine may increase your risk of getting an infection. Tell your doctor or health care professional if you are around anyone with measles or chickenpox, or if you develop sores or blisters that do not heal properly. °This medicine may affect blood sugar levels. If you have diabetes, check with your doctor or health care professional before you change your diet or the dose of your diabetic medicine. °Tell your doctor or health care professional right away if you have any change in your  eyesight. °Using this medicine for a long time may increase your risk of low bone   mass. Talk to your doctor about bone health. °What side effects may I notice from receiving this medicine? °Side effects that you should report to your doctor or health care professional as soon as possible: °-allergic reactions like skin rash, itching or hives, swelling of the face, lips, or tongue °-bloody or tarry stools °-changes in vision °-hallucination, loss of contact with reality °-muscle cramps °-muscle pain °-palpitations °-signs and symptoms of high blood sugar such as dizziness; dry mouth; dry skin; fruity breath; nausea; stomach pain; increased hunger or thirst; increased urination °-signs and symptoms of infection like fever or chills; cough; sore throat; pain or trouble passing urine °-trouble passing urine or change in the amount of urine °Side effects that usually do not require medical attention (report to your doctor or health care professional if they continue or are bothersome): °-changes in emotions or mood °-constipation °-diarrhea °-excessive hair growth on the face or body °-headache °-nausea, vomiting °-pain, redness, or irritation at site where injected °-trouble sleeping °-weight gain °This list may not describe all possible side effects. Call your doctor for medical advice about side effects. You may report side effects to FDA at 1-800-FDA-1088. °Where should I keep my medicine? °This drug is given in a hospital or clinic and will not be stored at home. °NOTE: This sheet is a summary. It may not cover all possible information. If you have questions about this medicine, talk to your doctor, pharmacist, or health care provider. °© 2018 Elsevier/Gold Standard (2016-01-03 16:21:28) ° °

## 2017-03-26 ENCOUNTER — Ambulatory Visit: Payer: Self-pay | Admitting: Internal Medicine

## 2017-03-27 ENCOUNTER — Ambulatory Visit: Payer: Medicare HMO

## 2017-03-27 ENCOUNTER — Telehealth: Payer: Self-pay | Admitting: *Deleted

## 2017-03-27 ENCOUNTER — Telehealth: Payer: Self-pay | Admitting: Pharmacist Clinician (PhC)/ Clinical Pharmacy Specialist

## 2017-03-27 MED FILL — VOSEVI 400-100-100 MG TAB: 400-100-100 | 28 days supply | Qty: 28 | Fill #1

## 2017-03-27 NOTE — Telephone Encounter (Signed)
[name] started on Vosevi around 5/1. He said that he hasn't had much side effects. He has reduce the prilosec to 20mg  and no longer takes oxcarbazepine. Bring him back on 5/31 for a 4 wks visit so we can do lab. Told him to call in for a refill in the next few days. He said that he missed the last 2 appts due to going back and forth to get his MS infusions.

## 2017-03-27 NOTE — Telephone Encounter (Signed)
Record release received from [place] at [facility] requesting more recent office notes. Office note faxed from 02/2017. Confirmation received. Wendall Mola

## 2017-04-02 ENCOUNTER — Inpatient Hospital Stay: Payer: Medicare HMO

## 2017-04-03 ENCOUNTER — Ambulatory Visit: Payer: Medicare HMO

## 2017-04-07 ENCOUNTER — Ambulatory Visit: Payer: Medicare HMO

## 2017-04-08 ENCOUNTER — Ambulatory Visit (INDEPENDENT_AMBULATORY_CARE_PROVIDER_SITE_OTHER): Payer: 59 | Admitting: Pharmacist Clinician (PhC)/ Clinical Pharmacy Specialist

## 2017-04-08 DIAGNOSIS — B182 Chronic viral hepatitis C: Secondary | ICD-10-CM

## 2017-04-08 LAB — COMPLETE METABOLIC PANEL WITH GFR
ALBUMIN: 4 g/dL (ref 3.6–5.1)
ALK PHOS: 86 U/L (ref 40–115)
ALT: 25 U/L (ref 9–46)
AST: 16 U/L (ref 10–35)
BILIRUBIN TOTAL: 0.6 mg/dL (ref 0.2–1.2)
BUN: 17 mg/dL (ref 7–25)
CALCIUM: 9.8 mg/dL (ref 8.6–10.3)
CHLORIDE: 103 mmol/L (ref 98–110)
CO2: 24 mmol/L (ref 20–31)
CREATININE: 1.42 mg/dL — AB (ref 0.70–1.33)
GFR, EST AFRICAN AMERICAN: 63 mL/min (ref >=60)
GFR, Est Non African American: 55 mL/min — ABNORMAL LOW (ref >=60)
Glucose, Bld: 112 mg/dL — ABNORMAL HIGH (ref 65–99)
Potassium: 3.3 mmol/L — ABNORMAL LOW (ref 3.5–5.3)
Sodium: 141 mmol/L (ref 135–146)
Total Protein: 7.5 g/dL (ref 6.1–8.1)

## 2017-04-08 NOTE — Patient Instructions (Signed)
Continue your Vosevi 1 daily with food at 10AM Stop Prilosec completely or reduce to 20mg  at [facility]

## 2017-04-08 NOTE — Progress Notes (Signed)
HPI: [name] is a 56 y.o. male who is here to f/u with pharmacy for his retreatment of his hep C.  Lab Results  Component Value Date   HCVGENOTYPE 2b 07/08/2016    Allergies: No Known Allergies  Vitals:    Past Medical History: Past Medical History:  Diagnosis Date  . Abdominal pain, unspecified site   . Anxiety   . Arthritis   . Benign paroxysmal positional vertigo   . Chronic back pain   . Chronic pain syndrome 01/25/2008  . Cluster headache   . Depression    takes Zoloft daily  . DVT (deep venous thrombosis) (HCC)    in the left arm '09  . Gait abnormality    "uses mobile wheelchair, but is ambulatory"  . Gallstones 02/17/2009   resolved after gallbladder surgery.  [name] GERD (gastroesophageal reflux disease)    takes Omeprazole as needed  . Headache(784.0)    cluster headaches frequently-takes Topamax daily  . History of colonoscopy   . HTN (hypertension)    takes Lisinopril,Verapamil,and Triamterene HCTZ daily  . Insomnia 11/06/2008  . Joint pain   . Joint swelling    03-07-16 "swelling of right wrist" "after a fall-xray done 03-06-16 "no fractures".  . Memory loss    no an issue at present 03-07-16  . Multiple sclerosis (HCC)    Dx. 2005 - Dr. Tinnie Gens follows LOV 4'17 tx. Tysabri monthly IV-[facility] , [place].  [name] Nonspecific elevation of levels of transaminase or lactic acid dehydrogenase (LDH)   . Other specified visual disturbances   . Other syndromes affecting cervical region   . Pneumonia 2009  . Trigeminal neuralgia     history" Multiple sclerosis"    Social History: Social History   Social History  . Marital status: Divorced    Spouse name: N/A  . Number of children: 3  . Years of education: 30 th   Occupational History  .      Disabled   Social History Main Topics  . Smoking status: Former Smoker    Packs/day: 1.00    Years: 38.00    Types: Cigarettes  . Smokeless tobacco: Never Used     Comment: cutting back  .  Alcohol use 0.0 oz/week     Comment: occasional  . Drug use: No     Comment: Quit 2011  . Sexual activity: Not on file   Other Topics Concern  . Not on file   Social History Narrative   Patient is divorced. Patient is disabled. Because of his MS. Patient has 11 th grade education. 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    Right handed.   Caffeine- One daily.    Labs: Hepatitis B Surface Ag (no units)  Date Value  05/28/2016 Negative   HCV Ab (no units)  Date Value  07/08/2016 REACTIVE (A)    Lab Results  Component Value Date   HCVGENOTYPE 2b 07/08/2016    Hepatitis C RNA quantitative Latest Ref Rng & Units 12/30/2016 07/08/2016 07/08/2016  HCV Quantitative NOT DETECTED IU/mL 17,600,000(H) 1,610,960(A) 5,409,811(B)  HCV Quantitative Log NOT DETECTED Log IU/mL 7.25(H) 6.90(H) 6.90(H)    AST (U/L)  Date Value  02/10/2017 69 (H)  12/17/2016 27  12/03/2016 37   ALT (U/L)  Date Value  02/10/2017 123 (H)  12/17/2016 141 (H)  12/03/2016 31  09/05/2016 40   INR (no units)  Date Value  12/19/2016 1.33  12/18/2016 1.40  12/17/2016  1.97    CrCl: CrCl cannot be calculated (Patient's most recent lab result is older than the maximum 21 days allowed.).  Fibrosis Score: F4 as assessed by Fibrosure   Child-Pugh Score: Class A  Previous Treatment Regimen: Epclusa  Assessment: [name] is on his second month of Vosevi at this point. He is still difficult to deal with because I can't really gauge if he is still taking the reduce prilosec. His answer was very iffy even though I have counseled him numerous times. He said that he is off of Trileptal right now. I stressed of how important it is for him to be off of those meds due to a major interaction. I went over very specific details with him again. We will have to do the hep C VL today to gauge his progress. He is going to come back at the end of July for his EOT VL.   He said that he has not  missed any doses so far and no side effects with Vosevi.   Recommendations:  Hep C VL/CMP today  Continue Vosevi til the end of July F/u for EOT VL then cure visit with Dr. Garnet Koyanagi [place], [place].D., BCPS, AAHIVP Clinical Infectious Disease Pharmacist [facility] 04/08/2017, 2:18 PM

## 2017-04-08 NOTE — Addendum Note (Signed)
Addended by: Mariea Clonts D on: 04/08/2017 05:16 PM   Modules accepted: Orders

## 2017-04-09 ENCOUNTER — Ambulatory Visit: Payer: Self-pay

## 2017-04-10 DIAGNOSIS — L03115 Cellulitis of right lower limb: Secondary | ICD-10-CM

## 2017-04-10 DIAGNOSIS — L02415 Cutaneous abscess of right lower limb: Secondary | ICD-10-CM

## 2017-04-10 HISTORY — DX: Cutaneous abscess of right lower limb: L03.115

## 2017-04-10 HISTORY — DX: Cutaneous abscess of right lower limb: L02.415

## 2017-04-10 LAB — HEPATITIS C RNA QUANTITATIVE
HCV QUANT: NOT DETECTED [IU]/mL
HCV Quantitative Log: <1.18 Log IU/mL

## 2017-04-13 ENCOUNTER — Telehealth: Payer: Self-pay | Admitting: Pharmacist Clinician (PhC)/ Clinical Pharmacy Specialist

## 2017-04-13 NOTE — Telephone Encounter (Signed)
Called to let him know that the virus is undetectable after the first month and to him to make sure to finish up 3 months.

## 2017-04-23 ENCOUNTER — Ambulatory Visit: Payer: Self-pay | Admitting: Internal Medicine

## 2017-04-24 ENCOUNTER — Ambulatory Visit: Payer: Medicare HMO | Admitting: Oncology

## 2017-04-24 ENCOUNTER — Telehealth: Payer: Self-pay | Admitting: Pharmacist

## 2017-04-24 ENCOUNTER — Ambulatory Visit: Payer: Medicare HMO

## 2017-04-24 NOTE — Telephone Encounter (Signed)
Received call yesterday morning (late putting in note) form [name] that he had a terrible headache and would not be able to come to Dr. Feliz Beam appointment on 6/14.  He states he has not missed any doses of his Vosevi and will come to his appointment end of next month at the EOT.

## 2017-04-25 MED FILL — VOSEVI 400-100-100 MG TAB: 400-100-100 | 28 days supply | Qty: 28 | Fill #2

## 2017-04-30 ENCOUNTER — Inpatient Hospital Stay: Payer: Medicare HMO

## 2017-04-30 NOTE — Progress Notes (Signed)
[facility]  [phone] [phone]  ID: [name] OB: 1961/04/25  MR#: [id number]  AOZ#:[id number]  Patient Care Team: Fleet Contras, MD as PCP - General (Internal Medicine) Christoper Allegra., MD as Referring Physician (Neurology)  CHIEF COMPLAINT:  Tysabri infusions for MS  INTERVAL HISTORY: Patient returns to clinic today for routine 6 month evaluation. His Tysabri on hold secondary to undergoing treatment for hepatitis C. Currently, he feels well and is at his baseline.  He has no new neurologic complaints.  He denies any recent fevers.  He has a good appetite and denies weight loss.  He has no chest pain or shortness of breath.  He denies any nausea, vomiting, constipation, or diarrhea.  He has no urinary complaints.  Patient offers no specific complaints today.  REVIEW OF SYSTEMS:   Review of Systems  Constitutional: Negative.  Negative for fever, malaise/fatigue and weight loss.  Respiratory: Negative.  Negative for cough and shortness of breath.   Cardiovascular: Negative.  Negative for chest pain.  Gastrointestinal: Negative.  Negative for nausea and vomiting.  Genitourinary: Negative.   Musculoskeletal: Negative.   Neurological: Negative.  Negative for weakness.  Psychiatric/Behavioral: Negative.     As per HPI. Otherwise, a complete review of systems is negative.  PAST MEDICAL HISTORY: Past Medical History:  Diagnosis Date  . Abdominal pain, unspecified site   . Anxiety   . Arthritis   . Benign paroxysmal positional vertigo   . Chronic back pain   . Chronic pain syndrome 01/25/2008  . Cluster headache   . Depression    takes Zoloft daily  . DVT (deep venous thrombosis) (HCC)    in the left arm '09  . Gait abnormality    "uses mobile wheelchair, but is ambulatory"  . Gallstones 02/17/2009   resolved after gallbladder surgery.  [name] GERD (gastroesophageal reflux disease)    takes Omeprazole as needed  .  Headache(784.0)    cluster headaches frequently-takes Topamax daily  . History of colonoscopy   . HTN (hypertension)    takes Lisinopril,Verapamil,and Triamterene HCTZ daily  . Insomnia 11/06/2008  . Joint pain   . Joint swelling    03-07-16 "swelling of right wrist" "after a fall-xray done 03-06-16 "no fractures".  . Memory loss    no an issue at present 03-07-16  . Multiple sclerosis (HCC)    Dx. 2005 - Dr. Tinnie Gens follows LOV 4'17 tx. Tysabri monthly IV-[facility] , [place].  [name] Nonspecific elevation of levels of transaminase or lactic acid dehydrogenase (LDH)   . Other specified visual disturbances   . Other syndromes affecting cervical region   . Pneumonia 2009  . Trigeminal neuralgia     history" Multiple sclerosis"    PAST SURGICAL HISTORY: Past Surgical History:  Procedure Laterality Date  . CHOLECYSTECTOMY  02/20/2009  . COLONOSCOPY WITH PROPOFOL N/A 03/17/2016   Procedure: COLONOSCOPY WITH PROPOFOL;  Surgeon: [name], MD;  Location: [facility];  Service: Endoscopy;  Laterality: N/A;  . PORT A CATH REVISION N/A 07/06/2015   Procedure: Removal and replacement of PORT A CATH;  Surgeon: [name], MD;  Location: [facility] OR;  Service: General;  Laterality: N/A;  . PORTACATH PLACEMENT N/A 03/27/2014   Procedure: INSERTION PORT-A-CATH;  Surgeon: [name], MD;  Location: [facility] OR;  Service: General;  Laterality: N/A;    FAMILY HISTORY Family History  Problem Relation Age of Onset  . Cancer Father   . Diabetes Mother  ADVANCED DIRECTIVES:    HEALTH MAINTENANCE: Social History  Substance Use Topics  . Smoking status: Former Smoker    Packs/day: 1.00    Years: 38.00    Types: Cigarettes  . Smokeless tobacco: Never Used     Comment: cutting back  . Alcohol use 0.0 oz/week     Comment: occasional     Colonoscopy:  PAP:  Bone density:  Lipid panel:  No Known Allergies  Current Outpatient Prescriptions  Medication Sig Dispense  Refill  . albuterol (PROVENTIL HFA;VENTOLIN HFA) 108 (90 Base) MCG/ACT inhaler Inhale 2 puffs into the lungs every 4 (four) hours as needed for wheezing or shortness of breath. 1 Inhaler 0  . baclofen (LIORESAL) 20 MG tablet Take 20 mg by mouth 4 (four) times daily.     [name] dalfampridine 10 MG TB12 Take 1 tablet (10 mg total) by mouth 2 (two) times daily. 60 tablet 11  . Dimethyl Fumarate 120 & 240 MG MISC Take by mouth.    . Dimethyl Fumarate 240 MG CPDR Take 240 mg by mouth 2 (two) times daily.    . Eszopiclone (ESZOPICLONE) 3 MG TABS Take 3 mg by mouth at bedtime as needed (for sleep).     . gabapentin (NEURONTIN) 600 MG tablet Take 600 mg by mouth 4 (four) times daily.     [name] levETIRAcetam (KEPPRA) 500 MG tablet Take 500 mg by mouth 2 (two) times daily.  5  . lisinopril-hydrochlorothiazide (PRINZIDE,ZESTORETIC) 10-12.5 MG tablet Take 1 tablet by mouth daily.  5  . omeprazole (PRILOSEC) 40 MG capsule Take 40 mg by mouth daily.  4  . Oxcarbazepine (TRILEPTAL) 300 MG tablet Take 300 mg by mouth 3 (three) times daily.    [name] oxyCODONE-acetaminophen (PERCOCET) 10-325 MG tablet Take 1 tablet by mouth every 4 (four) hours as needed for pain.     [name] senna-docusate (SENOKOT-S) 8.6-50 MG tablet Take 1 tablet by mouth 2 (two) times daily.    . sertraline (ZOLOFT) 50 MG tablet Take 50 mg by mouth daily.     . Sofosbuv-Velpatasv-Voxilaprev (VOSEVI) 400-100-100 MG TABS Take 1 tablet by mouth daily with breakfast. 28 tablet 84  . topiramate (TOPAMAX) 25 MG tablet Take 1 tablet (25 mg total) by mouth 2 (two) times daily. 30 tablet 0  . triamterene-hydrochlorothiazide (DYAZIDE) 37.5-25 MG capsule Take 1 capsule by mouth daily as needed (blood pressure/edema).   5  . verapamil (CALAN-SR) 120 MG CR tablet Take 1 tablet (120 mg total) by mouth at bedtime. 30 tablet 0  . XARELTO 10 MG TABS tablet Take 10 mg by mouth daily.  2   No current facility-administered medications for this visit.    Facility-Administered  Medications Ordered in Other Visits  Medication Dose Route Frequency Provider Last Rate Last Dose  . [MAR Hold] 0.9 %  sodium chloride infusion   Intravenous Once Jeralyn Ruths, MD      . Mitzi Hansen Hold] 0.9 %  sodium chloride infusion   Intravenous Once Jeralyn Ruths, MD      . Mitzi Hansen Hold] acetaminophen (TYLENOL) tablet 1,000 mg  1,000 mg Oral Once Jeralyn Ruths, MD      . Mitzi Hansen Hold] acetaminophen (TYLENOL) tablet 1,000 mg  1,000 mg Oral Once Jeralyn Ruths, MD      . Mitzi Hansen Hold] acetaminophen (TYLENOL) tablet 1,000 mg  1,000 mg Oral Once Jeralyn Ruths, MD      . natalizumab (TYSABRI) 300 mg in sodium chloride 0.9 % 100  mL IVPB  300 mg Intravenous Once Jeralyn Ruths, MD      . Mitzi Hansen Hold] natalizumab (TYSABRI) 300 mg in sodium chloride 0.9 % 100 mL IVPB  300 mg Intravenous Once Jeralyn Ruths, MD      . Mitzi Hansen Hold] natalizumab (TYSABRI) 300 mg in sodium chloride 0.9 % 100 mL IVPB  300 mg Intravenous Once Jeralyn Ruths, MD      . sodium chloride 0.9 % injection 10 mL  10 mL Intracatheter PRN Loann Quill, NP   10 mL at 11/07/15 1012  . sodium chloride flush (NS) 0.9 % injection 10 mL  10 mL Intravenous PRN Jeralyn Ruths, MD   10 mL at 05/01/17 1053    OBJECTIVE: Vitals:   05/01/17 1033  BP: 100/64  Pulse: (!) 54  Temp: 97.6 F (36.4 C)     Body mass index is 28.98 kg/m.    ECOG FS:1 - Symptomatic but completely ambulatory  General: Well-developed, well-nourished, no acute distress.  Eyes: anicteric sclera. Lungs: Clear to auscultation bilaterally. Heart: Regular rate and rhythm. No rubs, murmurs, or gallops. Abdomen: Soft, nontender, nondistended. No organomegaly noted, normoactive bowel sounds. Musculoskeletal: No edema, cyanosis, or clubbing. Neuro: Alert, answering all questions appropriately. Cranial nerves grossly intact. Skin: No rashes or petechiae noted. Psych: Normal affect.   LAB RESULTS:  Lab Results  Component Value  Date   NA 141 04/08/2017   K 3.3 (L) 04/08/2017   CL 103 04/08/2017   CO2 24 04/08/2017   GLUCOSE 112 (H) 04/08/2017   BUN 17 04/08/2017   CREATININE 1.42 (H) 04/08/2017   CALCIUM 9.8 04/08/2017   PROT 7.5 04/08/2017   ALBUMIN 4.0 04/08/2017   AST 16 04/08/2017   ALT 25 04/08/2017   ALKPHOS 86 04/08/2017   BILITOT 0.6 04/08/2017   GFRNONAA 55 (L) 04/08/2017   GFRAA 63 04/08/2017    Lab Results  Component Value Date   WBC 7.9 01/22/2017   NEUTROABS 2.8 01/22/2017   HGB 13.6 01/22/2017   HCT 40.9 01/22/2017   MCV 80 01/22/2017   PLT 353 01/22/2017     STUDIES: No results found.  ASSESSMENT: Tysabri infusion for multiple sclerosis.  PLAN:    1.  Tysabri infusion for multiple sclerosis: Continue to hold 300 mg Tysabri while undergoing treatment for hepatitis C. By report, patient will resume treatments in August 2018. At which point, will continue infusions every 4 weeks. We will continue current treatments unless directed by his primary neurologist, Dr. Tinnie Gens. Return to clinic 6 months after reinitiation of Tysabri for routine evaluation.  Patient understands that all of his laboratory work, including JC virus, will be monitored by his primary neurologist.  He also understands that if he has any questions regarding Tysabri or his multiple sclerosis but they should be directed towards Dr. Tinnie Gens.    Patient expressed understanding and was in agreement with this plan. He also understands that He can call clinic at any time with any questions, concerns, or complaints.    Jeralyn Ruths, MD   05/01/2017 10:54 AM

## 2017-05-01 ENCOUNTER — Ambulatory Visit: Payer: Medicare HMO

## 2017-05-01 ENCOUNTER — Inpatient Hospital Stay: Payer: Medicare Other | Attending: Oncology | Admitting: Oncology

## 2017-05-01 ENCOUNTER — Inpatient Hospital Stay: Payer: Medicare Other

## 2017-05-01 VITALS — BP 100/64 | HR 54 | Temp 97.6°F | Wt 196.2 lb

## 2017-05-01 DIAGNOSIS — Z79899 Other long term (current) drug therapy: Secondary | ICD-10-CM | POA: Diagnosis not present

## 2017-05-01 DIAGNOSIS — G4700 Insomnia, unspecified: Secondary | ICD-10-CM | POA: Insufficient documentation

## 2017-05-01 DIAGNOSIS — B1920 Unspecified viral hepatitis C without hepatic coma: Secondary | ICD-10-CM | POA: Diagnosis not present

## 2017-05-01 DIAGNOSIS — I10 Essential (primary) hypertension: Secondary | ICD-10-CM | POA: Diagnosis not present

## 2017-05-01 DIAGNOSIS — G35 Multiple sclerosis: Secondary | ICD-10-CM

## 2017-05-01 DIAGNOSIS — Z87891 Personal history of nicotine dependence: Secondary | ICD-10-CM | POA: Insufficient documentation

## 2017-05-01 DIAGNOSIS — K219 Gastro-esophageal reflux disease without esophagitis: Secondary | ICD-10-CM | POA: Diagnosis not present

## 2017-05-01 DIAGNOSIS — F419 Anxiety disorder, unspecified: Secondary | ICD-10-CM | POA: Diagnosis not present

## 2017-05-01 DIAGNOSIS — Z95828 Presence of other vascular implants and grafts: Secondary | ICD-10-CM

## 2017-05-01 DIAGNOSIS — F329 Major depressive disorder, single episode, unspecified: Secondary | ICD-10-CM | POA: Diagnosis not present

## 2017-05-01 DIAGNOSIS — G35D Multiple sclerosis, unspecified: Secondary | ICD-10-CM

## 2017-05-01 MED ORDER — HEPARIN SOD (PORK) LOCK FLUSH 100 UNIT/ML IV SOLN
500.0000 [IU] | Freq: Once | INTRAVENOUS | Status: AC
  Administered 2017-05-01: 500 [IU] via INTRAVENOUS

## 2017-05-01 MED ORDER — HEPARIN SOD (PORK) LOCK FLUSH 100 UNIT/ML IV SOLN
INTRAVENOUS | Status: AC
  Filled 2017-05-01: qty 5

## 2017-05-01 MED ORDER — SODIUM CHLORIDE 0.9% FLUSH
10.0000 mL | INTRAVENOUS | Status: DC | PRN
  Administered 2017-05-01: 10 mL via INTRAVENOUS
  Filled 2017-05-01: qty 10

## 2017-05-01 NOTE — Progress Notes (Signed)
Patient here today for follow up.   

## 2017-05-06 ENCOUNTER — Inpatient Hospital Stay (HOSPITAL_COMMUNITY)
Admission: EM | Admit: 2017-05-06 | Discharge: 2017-05-11 | DRG: 603 | Disposition: A | Discharge status: Home Health | Payer: Medicare Other | Attending: Internal Medicine | Admitting: Internal Medicine

## 2017-05-06 ENCOUNTER — Emergency Department (HOSPITAL_COMMUNITY): Payer: Medicare Other

## 2017-05-06 DIAGNOSIS — G894 Chronic pain syndrome: Secondary | ICD-10-CM | POA: Diagnosis present

## 2017-05-06 DIAGNOSIS — Z87891 Personal history of nicotine dependence: Secondary | ICD-10-CM

## 2017-05-06 DIAGNOSIS — E876 Hypokalemia: Secondary | ICD-10-CM

## 2017-05-06 DIAGNOSIS — Z993 Dependence on wheelchair: Secondary | ICD-10-CM

## 2017-05-06 DIAGNOSIS — R51 Headache: Secondary | ICD-10-CM

## 2017-05-06 DIAGNOSIS — R569 Unspecified convulsions: Secondary | ICD-10-CM | POA: Diagnosis present

## 2017-05-06 DIAGNOSIS — Z7901 Long term (current) use of anticoagulants: Secondary | ICD-10-CM

## 2017-05-06 DIAGNOSIS — B182 Chronic viral hepatitis C: Secondary | ICD-10-CM | POA: Diagnosis present

## 2017-05-06 DIAGNOSIS — R519 Headache, unspecified: Secondary | ICD-10-CM

## 2017-05-06 DIAGNOSIS — L03115 Cellulitis of right lower limb: Principal | ICD-10-CM | POA: Diagnosis present

## 2017-05-06 DIAGNOSIS — M79606 Pain in leg, unspecified: Secondary | ICD-10-CM

## 2017-05-06 DIAGNOSIS — Z9181 History of falling: Secondary | ICD-10-CM

## 2017-05-06 DIAGNOSIS — M609 Myositis, unspecified: Secondary | ICD-10-CM | POA: Diagnosis present

## 2017-05-06 DIAGNOSIS — Z86718 Personal history of other venous thrombosis and embolism: Secondary | ICD-10-CM

## 2017-05-06 DIAGNOSIS — I10 Essential (primary) hypertension: Secondary | ICD-10-CM | POA: Diagnosis present

## 2017-05-06 DIAGNOSIS — F329 Major depressive disorder, single episode, unspecified: Secondary | ICD-10-CM | POA: Diagnosis present

## 2017-05-06 DIAGNOSIS — G44009 Cluster headache syndrome, unspecified, not intractable: Secondary | ICD-10-CM | POA: Diagnosis present

## 2017-05-06 DIAGNOSIS — K219 Gastro-esophageal reflux disease without esophagitis: Secondary | ICD-10-CM | POA: Diagnosis present

## 2017-05-06 DIAGNOSIS — G35 Multiple sclerosis: Secondary | ICD-10-CM | POA: Diagnosis present

## 2017-05-06 HISTORY — DX: Cutaneous abscess of right lower limb: L02.415

## 2017-05-06 HISTORY — DX: Cellulitis of right lower limb: L03.115

## 2017-05-06 LAB — CBC WITH DIFFERENTIAL/PLATELET
Basophils Absolute: 0 10*3/uL (ref 0.0–0.1)
Basophils Relative: 0 %
EOS ABS: 0 10*3/uL (ref 0.0–0.7)
Eosinophils Relative: 0 %
HCT: 31.9 % — ABNORMAL LOW (ref 39.0–52.0)
Hemoglobin: 10.4 g/dL — ABNORMAL LOW (ref 13.0–17.0)
Lymphocytes Relative: 30 %
Lymphs Abs: 4.3 10*3/uL — ABNORMAL HIGH (ref 0.7–4.0)
MCH: 26.1 pg (ref 26.0–34.0)
MCHC: 32.6 g/dL (ref 30.0–36.0)
MCV: 80.2 fL (ref 78.0–100.0)
MONO ABS: 1.8 10*3/uL — AB (ref 0.1–1.0)
Monocytes Relative: 13 %
NEUTROS PCT: 57 %
Neutro Abs: 8.1 10*3/uL — ABNORMAL HIGH (ref 1.7–7.7)
PLATELETS: 357 10*3/uL (ref 150–400)
RBC: 3.98 MIL/uL — AB (ref 4.22–5.81)
RDW: 15.6 % — AB (ref 11.5–15.5)
WBC: 14.2 10*3/uL — AB (ref 4.0–10.5)

## 2017-05-06 MED ORDER — VANCOMYCIN HCL IN DEXTROSE 1-5 GM/200ML-% IV SOLN
1000.0000 mg | Freq: Once | INTRAVENOUS | Status: AC
  Administered 2017-05-06: 1000 mg via INTRAVENOUS
  Filled 2017-05-06 (×2): qty 200

## 2017-05-06 NOTE — ED Notes (Addendum)
Pt reports going to [place] three weeks ago and on the way home stopped at a hotel and drank some water. Pt noticed there was some bugs in same. Pt states he went to the doctor and was told he had bed bugs. Pt does not complain of any external itching however does state he feels as though they are inside of him. Pt states he feels as though they are inside of him running up and down his leg. Pt complains of pain, redness, and swelling to the right lower leg along with pain in his head all associated with what he calls is bed bugs.

## 2017-05-06 NOTE — ED Provider Notes (Signed)
[facility]-EMERGENCY DEPT Provider Note   CSN: [id number] Arrival date & time: 05/06/17  2035    History   Chief Complaint Chief Complaint  Patient presents with  . Leg Injury    HPI [name] is a 56 y.o. male.  56 year old male with hx of MS (wheelchair bound; normally on Tysabri infusions, but held due to Hep C tx), hepatitis C (currently on Vosevi), DVT (on Xarelto), HTN, and GERD presents to the ED for evaluation of formication. Patient reports sensations of bugs crawling around in his brain and in his RLE. Symptoms present x 3 weeks, since patient alleges drinking a glass of water with bed bugs in it. He has also been noting redness and swelling to his right lower extremity over the past 1.5 weeks. He notes being switched from Coumadin to Xarelto 3 weeks ago. Patient denies any known fevers. He states that he told his doctor about the redness and swelling in his leg. He was on a trial of antibiotics, per patient; however he reports being off antibiotics for at least 2-3 weeks. Patient denies his headache symptoms being c/w MS flare. He denies any neurologic changes from his baseline.      Past Medical History:  Diagnosis Date  . Abdominal pain, unspecified site   . Anxiety   . Arthritis   . Benign paroxysmal positional vertigo   . Chronic back pain   . Chronic pain syndrome 01/25/2008  . Cluster headache   . Depression    takes Zoloft daily  . DVT (deep venous thrombosis) (HCC)    in the left arm '09  . Gait abnormality    "uses mobile wheelchair, but is ambulatory"  . Gallstones 02/17/2009   resolved after gallbladder surgery.  [name] GERD (gastroesophageal reflux disease)    takes Omeprazole as needed  . Headache(784.0)    cluster headaches frequently-takes Topamax daily  . History of colonoscopy   . HTN (hypertension)    takes Lisinopril,Verapamil,and Triamterene HCTZ daily  . Insomnia 11/06/2008  . Joint pain   . Joint swelling    03-07-16 "swelling of right  wrist" "after a fall-xray done 03-06-16 "no fractures".  . Memory loss    no an issue at present 03-07-16  . Multiple sclerosis (HCC)    Dx. 2005 - Dr. Tinnie Gens follows LOV 4'17 tx. Tysabri monthly IV-[facility] , [place].  [name] Nonspecific elevation of levels of transaminase or lactic acid dehydrogenase (LDH)   . Other specified visual disturbances   . Other syndromes affecting cervical region   . Pneumonia 2009  . Trigeminal neuralgia     history" Multiple sclerosis"    Patient Active Problem List   Diagnosis Date Noted  . Right carpal tunnel syndrome 02/16/2017  . Gait disturbance 01/22/2017  . Insomnia 01/22/2017  . Depression with anxiety 01/22/2017  . High risk medication use 01/22/2017  . Major depressive disorder, recurrent episode (HCC) 12/19/2016  . Suicidal ideation   . AKI (acute kidney injury) (HCC) 11/15/2016  . Chronic hepatitis C virus infection (HCC) 11/15/2016  . Chronic abdominal pain 11/15/2016  . Chronic pain syndrome 11/15/2016  . Acute retention of urine 11/15/2016  . Generalized abdominal pain   . Chronic cluster headache, not intractable   . Community acquired pneumonia   . TB lung, latent   . HCAP (healthcare-associated pneumonia) 02/16/2016  . Hypokalemia 02/16/2016  . Intractable cluster headache syndrome   . DVT (deep venous thrombosis) (HCC)   . Depression   .  GERD (gastroesophageal reflux disease)   . HTN (hypertension)   . Essential hypertension   . Gastroesophageal reflux disease without esophagitis   . CAP (community acquired pneumonia)   . Multiple sclerosis (HCC) 08/02/2013  . ABDOMINAL BLOATING 10/14/2010  . LOOSE STOOLS 10/14/2010  . PULMONARY EMBOLISM, HX OF 10/14/2010    Past Surgical History:  Procedure Laterality Date  . CHOLECYSTECTOMY  02/20/2009  . COLONOSCOPY WITH PROPOFOL N/A 03/17/2016   Procedure: COLONOSCOPY WITH PROPOFOL;  Surgeon: [name], MD;  Location: [facility];  Service: Endoscopy;  Laterality:  N/A;  . PORT A CATH REVISION N/A 07/06/2015   Procedure: Removal and replacement of PORT A CATH;  Surgeon: [name], MD;  Location: [facility] OR;  Service: General;  Laterality: N/A;  . PORTACATH PLACEMENT N/A 03/27/2014   Procedure: INSERTION PORT-A-CATH;  Surgeon: [name], MD;  Location: [facility] OR;  Service: General;  Laterality: N/A;       Home Medications    Prior to Admission medications   Medication Sig Start Date End Date Taking? Authorizing Provider  baclofen (LIORESAL) 20 MG tablet Take 20 mg by mouth 4 (four) times daily.    Yes [provider]  dalfampridine 10 MG TB12 Take 1 tablet (10 mg total) by mouth 2 (two) times daily. 02/16/17  Yes Sater, Pearletha Furl, MD  Eszopiclone (ESZOPICLONE) 3 MG TABS Take 3 mg by mouth at bedtime as needed (for sleep).    Yes [provider]  gabapentin (NEURONTIN) 600 MG tablet Take 600 mg by mouth 4 (four) times daily.    Yes [provider]  levETIRAcetam (KEPPRA) 500 MG tablet Take 500 mg by mouth 2 (two) times daily.   Yes [provider]  lisinopril-hydrochlorothiazide (PRINZIDE,ZESTORETIC) 10-12.5 MG tablet Take 1 tablet by mouth daily. 11/25/16  Yes [provider]  Natalizumab (TYSABRI IV) Inject into the vein every 30 (thirty) days.   Yes [provider]  omeprazole (PRILOSEC) 40 MG capsule Take 40 mg by mouth daily. 10/20/16  Yes [provider]  Oxcarbazepine (TRILEPTAL) 300 MG tablet Take 300 mg by mouth 3 (three) times daily.   Yes [provider]  oxyCODONE-acetaminophen (PERCOCET) 10-325 MG tablet Take 1 tablet by mouth every 4 (four) hours as needed for pain.  11/28/16  Yes [provider]  sertraline (ZOLOFT) 50 MG tablet Take 50 mg by mouth daily.    Yes [provider]  Sofosbuv-Velpatasv-Voxilaprev (VOSEVI) 400-100-100 MG TABS Take 1 tablet by mouth daily with breakfast. 03/05/17  Yes Judyann Munson, MD  topiramate (TOPAMAX) 25 MG tablet Take  1 tablet (25 mg total) by mouth 2 (two) times daily. 12/19/16  Yes Mancel Bale, MD  triamterene-hydrochlorothiazide (DYAZIDE) 37.5-25 MG capsule Take by mouth daily as needed (blood pressure/edema).  12/03/16  Yes [provider]  verapamil (CALAN-SR) 120 MG CR tablet Take 1 tablet (120 mg total) by mouth at bedtime. 11/30/16  Yes Law, Alexandra M, PA-C  XARELTO 10 MG TABS tablet Take 10 mg by mouth daily. 04/09/17  Yes [provider]    Family History Family History  Problem Relation Age of Onset  . Cancer Father   . Diabetes Mother     Social History Social History  Substance Use Topics  . Smoking status: Former Smoker    Packs/day: 1.00    Years: 38.00    Types: Cigarettes  . Smokeless tobacco: Never Used     Comment: cutting back  . Alcohol use 0.0 oz/week  Comment: occasional     Allergies   Patient has no known allergies.   Review of Systems Review of Systems Ten systems reviewed and are negative for acute change, except as noted in the HPI.    Physical Exam Updated Vital Signs BP 118/73 (BP Location: Right Arm)   Pulse 87   Resp 20   SpO2 94%   Physical Exam  Constitutional: He is oriented to person, place, and time. He appears well-developed and well-nourished. No distress.  Nontoxic and in NAD  HENT:  Head: Normocephalic and atraumatic.  No hemotympanum bilaterally. No changes to the TMs.  Eyes: Conjunctivae and EOM are normal. Pupils are equal, round, and reactive to light. No scleral icterus.  Neck: Normal range of motion.  No nuchal rigidity or meningismus  Cardiovascular: Normal rate, regular rhythm and intact distal pulses.   Pulmonary/Chest: Effort normal. No respiratory distress. He has no wheezes.  Respirations even and unlabored  Musculoskeletal: Normal range of motion.  Neurological: He is alert and oriented to person, place, and time. He exhibits normal muscle tone. Coordination normal.  GCS 15. No cranial nerve deficits  appreciated; symmetric eyebrow raise, no facial drooping, tongue midline. Patient has equal grip strength bilaterally. 5/5 strength noted in bilateral upper extremities. Patient with 4-5/5 strength in the left lower extremity. Strength 2/5 in the right lower extremity which patient reports is his baseline.  Skin: Skin is warm and dry. No rash noted. He is not diaphoretic. There is erythema. No pallor.  Erythema and warmth noted to the right lower extremity extending from the right foot proximally to the mid aspect of the tib-fib. There is pitting edema associated with this. No red streaking.  Psychiatric: He has a normal mood and affect. His behavior is normal.  Nursing note and vitals reviewed.    ED Treatments / Results  Labs (all labs ordered are listed, but only abnormal results are displayed) Labs Reviewed  CBC WITH DIFFERENTIAL/PLATELET - Abnormal; Notable for the following:       Result Value   WBC 14.2 (*)    RBC 3.98 (*)    Hemoglobin 10.4 (*)    HCT 31.9 (*)    RDW 15.6 (*)    Neutro Abs 8.1 (*)    Lymphs Abs 4.3 (*)    Monocytes Absolute 1.8 (*)    All other components within normal limits  COMPREHENSIVE METABOLIC PANEL - Abnormal; Notable for the following:    Sodium 133 (*)    Potassium 2.4 (*)    Chloride 97 (*)    Glucose, Bld 120 (*)    Total Bilirubin 1.4 (*)    All other components within normal limits  C-REACTIVE PROTEIN - Abnormal; Notable for the following:    CRP 16.5 (*)    All other components within normal limits  SEDIMENTATION RATE    EKG  EKG Interpretation None       Radiology Dg Tibia/fibula Right  Result Date: 05/06/2017 CLINICAL DATA:  Swelling and erythema EXAM: RIGHT TIBIA AND FIBULA - 2 VIEW COMPARISON:  None. FINDINGS: There is no evidence of fracture or other focal bone lesions. Soft tissues are edematous but there is no soft tissue emphysema. IMPRESSION: No acute osseous abnormality or soft tissue emphysema. Electronically Signed    By: Deatra Robinson M.D.   On: 05/06/2017 22:16   Dg Foot 2 Views Right  Result Date: 05/06/2017 CLINICAL DATA:  Edema and erythema EXAM: RIGHT FOOT - 2 VIEW COMPARISON:  None. FINDINGS:  The bones are osteopenic. There is soft tissue swelling without soft tissue emphysema. No osteolysis. No ankle effusion. IMPRESSION: No acute osseous abnormality or soft tissue emphysema. Electronically Signed   By: Deatra Robinson M.D.   On: 05/06/2017 22:19    Procedures Procedures (including critical care time)  Medications Ordered in ED Medications  vancomycin (VANCOCIN) IVPB 1000 mg/200 mL premix (1,000 mg Intravenous New Bag/Given 05/06/17 2322)  potassium chloride SA (K-DUR,KLOR-CON) CR tablet 40 mEq (not administered)  rivaroxaban (XARELTO) tablet 10 mg (not administered)  verapamil (CALAN-SR) CR tablet 120 mg (not administered)  triamterene-hydrochlorothiazide (DYAZIDE) 37.5-25 MG per capsule 1 capsule (not administered)  topiramate (TOPAMAX) tablet 25 mg (not administered)  sertraline (ZOLOFT) tablet 50 mg (not administered)  Sofosbuv-Velpatasv-Voxilaprev 400-100-100 MG TABS 1 tablet (not administered)  Oxcarbazepine (TRILEPTAL) tablet 300 mg (not administered)  oxyCODONE-acetaminophen (PERCOCET) 10-325 MG per tablet 1 tablet (not administered)  lisinopril-hydrochlorothiazide (PRINZIDE,ZESTORETIC) 10-12.5 MG per tablet 1 tablet (not administered)  levETIRAcetam (KEPPRA) tablet 500 mg (not administered)  pantoprazole (PROTONIX) EC tablet 80 mg (not administered)  gabapentin (NEURONTIN) tablet 600 mg (not administered)  zolpidem (AMBIEN) tablet 5 mg (not administered)  dalfampridine TB12 10 mg (not administered)  baclofen (LIORESAL) tablet 20 mg (not administered)        Initial Impression / Assessment and Plan / ED Course  I have reviewed the triage vital signs and the nursing notes.  Pertinent labs & imaging results that were available during my care of the patient were reviewed by me and  considered in my medical decision making (see chart for details).     56 year old male resents to the emergency department for formication. He was found to have an cellulitis extending from his right foot proximally to the mid aspect of his tib-fib. There is associated pitting edema. Patient does report being switched from warfarin to Xarelto. He denies missing any doses of Xarelto on however, unable to exclude DVT as ultrasound no longer available. Leukocytosis and elevated CRP lended itself to cellulitis. Patient covered in the emergency department with vancomycin.  Patient also with hypokalemia. Oral potassium ordered. Given extensive nature of cellulitis, plan for observation admission and IV antibiotics. Case discussed with Dr. Julian Reil who will admit.   Final Clinical Impressions(s) / ED Diagnoses   Final diagnoses:  Cellulitis of right lower extremity  Hypokalemia    New Prescriptions New Prescriptions   No medications on file     Antony Madura, Cordelia Poche 05/07/17 Eartha Inch, MD 05/08/17 1650

## 2017-05-06 NOTE — ED Notes (Signed)
Patient transported to X-ray 

## 2017-05-06 NOTE — ED Triage Notes (Addendum)
Per [facility], Pt from home. Pt reports redness and swelling to R lower leg. Pt states, "I was in a hotel in [place],  I drank a glass of water that had bed bugs in it and now I have bed bugs in my head. Bed bugs are causing the redness on my leg. Pt has hx of MS and uses a motor-scooter to get around. Pt alert and oriented.

## 2017-05-07 ENCOUNTER — Observation Stay (HOSPITAL_COMMUNITY): Payer: Medicare Other

## 2017-05-07 ENCOUNTER — Encounter (HOSPITAL_COMMUNITY): Payer: Self-pay | Admitting: General Practice

## 2017-05-07 ENCOUNTER — Observation Stay (HOSPITAL_BASED_OUTPATIENT_CLINIC_OR_DEPARTMENT_OTHER): Payer: Medicare Other

## 2017-05-07 DIAGNOSIS — M79609 Pain in unspecified limb: Secondary | ICD-10-CM | POA: Diagnosis not present

## 2017-05-07 DIAGNOSIS — G35 Multiple sclerosis: Secondary | ICD-10-CM | POA: Diagnosis not present

## 2017-05-07 DIAGNOSIS — M7989 Other specified soft tissue disorders: Secondary | ICD-10-CM

## 2017-05-07 DIAGNOSIS — I10 Essential (primary) hypertension: Secondary | ICD-10-CM

## 2017-05-07 DIAGNOSIS — L03115 Cellulitis of right lower limb: Secondary | ICD-10-CM | POA: Diagnosis present

## 2017-05-07 DIAGNOSIS — E876 Hypokalemia: Secondary | ICD-10-CM

## 2017-05-07 DIAGNOSIS — G894 Chronic pain syndrome: Secondary | ICD-10-CM | POA: Diagnosis not present

## 2017-05-07 DIAGNOSIS — B182 Chronic viral hepatitis C: Secondary | ICD-10-CM | POA: Diagnosis not present

## 2017-05-07 LAB — BASIC METABOLIC PANEL
Anion gap: 6 (ref 5–15)
BUN: 11 mg/dL (ref 6–20)
CO2: 27 mmol/L (ref 22–32)
Calcium: 8.7 mg/dL — ABNORMAL LOW (ref 8.9–10.3)
Chloride: 100 mmol/L — ABNORMAL LOW (ref 101–111)
Creatinine, Ser: 0.98 mg/dL (ref 0.61–1.24)
Glucose, Bld: 134 mg/dL — ABNORMAL HIGH (ref 65–99)
POTASSIUM: 2.8 mmol/L — AB (ref 3.5–5.1)
SODIUM: 133 mmol/L — AB (ref 135–145)

## 2017-05-07 LAB — COMPREHENSIVE METABOLIC PANEL
ALT: 18 U/L (ref 17–63)
AST: 16 U/L (ref 15–41)
Albumin: 3.8 g/dL (ref 3.5–5.0)
Alkaline Phosphatase: 73 U/L (ref 38–126)
Anion gap: 9 (ref 5–15)
BUN: 12 mg/dL (ref 6–20)
CHLORIDE: 97 mmol/L — AB (ref 101–111)
CO2: 27 mmol/L (ref 22–32)
CREATININE: 1.07 mg/dL (ref 0.61–1.24)
Calcium: 9.2 mg/dL (ref 8.9–10.3)
GFR calc non Af Amer: >60 mL/min (ref >60)
Glucose, Bld: 120 mg/dL — ABNORMAL HIGH (ref 65–99)
POTASSIUM: 2.4 mmol/L — AB (ref 3.5–5.1)
Sodium: 133 mmol/L — ABNORMAL LOW (ref 135–145)
Total Bilirubin: 1.4 mg/dL — ABNORMAL HIGH (ref 0.3–1.2)
Total Protein: 7.6 g/dL (ref 6.5–8.1)

## 2017-05-07 LAB — SEDIMENTATION RATE: Sed Rate: 81 mm/hr — ABNORMAL HIGH (ref 0–16)

## 2017-05-07 LAB — MAGNESIUM: MAGNESIUM: 2.2 mg/dL (ref 1.7–2.4)

## 2017-05-07 LAB — C-REACTIVE PROTEIN: CRP: 16.5 mg/dL — AB (ref <1.0)

## 2017-05-07 LAB — HIV ANTIBODY (ROUTINE TESTING W REFLEX): HIV SCREEN 4TH GENERATION: NONREACTIVE

## 2017-05-07 MED ORDER — ZOLPIDEM TARTRATE 5 MG PO TABS
5.0000 mg | ORAL_TABLET | Freq: Every evening | ORAL | Status: DC | PRN
  Administered 2017-05-07 – 2017-05-11 (×2): 5 mg via ORAL
  Filled 2017-05-07 (×2): qty 1

## 2017-05-07 MED ORDER — SODIUM CHLORIDE 0.9% FLUSH
10.0000 mL | INTRAVENOUS | Status: DC | PRN
  Administered 2017-05-11: 10 mL
  Filled 2017-05-07: qty 40

## 2017-05-07 MED ORDER — SOFOSBUV-VELPATASV-VOXILAPREV 400-100-100 MG PO TABS
1.0000 | ORAL_TABLET | Freq: Every day | ORAL | Status: DC
  Administered 2017-05-08 – 2017-05-11 (×4): 1 via ORAL
  Filled 2017-05-07 (×5): qty 1

## 2017-05-07 MED ORDER — OXYCODONE HCL 5 MG PO TABS
5.0000 mg | ORAL_TABLET | ORAL | Status: DC | PRN
  Administered 2017-05-07 (×2): 5 mg via ORAL
  Filled 2017-05-07 (×2): qty 1

## 2017-05-07 MED ORDER — POTASSIUM CHLORIDE CRYS ER 20 MEQ PO TBCR
40.0000 meq | EXTENDED_RELEASE_TABLET | Freq: Once | ORAL | Status: AC
  Administered 2017-05-07: 40 meq via ORAL
  Filled 2017-05-07: qty 2

## 2017-05-07 MED ORDER — HYDROCHLOROTHIAZIDE 12.5 MG PO CAPS: 12.5000 mg | ORAL_CAPSULE | Freq: Every day | ORAL | Status: DC

## 2017-05-07 MED ORDER — OXYCODONE-ACETAMINOPHEN 10-325 MG PO TABS: 1.0000 | ORAL_TABLET | ORAL | Status: DC | PRN

## 2017-05-07 MED ORDER — POTASSIUM CHLORIDE CRYS ER 20 MEQ PO TBCR
60.0000 meq | EXTENDED_RELEASE_TABLET | Freq: Three times a day (TID) | ORAL | Status: AC
  Administered 2017-05-07 (×2): 60 meq via ORAL
  Filled 2017-05-07 (×2): qty 3

## 2017-05-07 MED ORDER — CEFAZOLIN SODIUM-DEXTROSE 1-4 GM/50ML-% IV SOLN
1.0000 g | Freq: Three times a day (TID) | INTRAVENOUS | Status: DC
  Filled 2017-05-07: qty 50

## 2017-05-07 MED ORDER — DALFAMPRIDINE ER 10 MG PO TB12
10.0000 mg | ORAL_TABLET | Freq: Two times a day (BID) | ORAL | Status: DC
  Administered 2017-05-07 – 2017-05-11 (×8): 10 mg via ORAL
  Filled 2017-05-07 (×9): qty 1

## 2017-05-07 MED ORDER — VERAPAMIL HCL ER 120 MG PO TBCR
120.0000 mg | EXTENDED_RELEASE_TABLET | Freq: Every day | ORAL | Status: DC
  Administered 2017-05-07 – 2017-05-10 (×4): 120 mg via ORAL
  Filled 2017-05-07 (×6): qty 1

## 2017-05-07 MED ORDER — KETOROLAC TROMETHAMINE 30 MG/ML IJ SOLN
60.0000 mg | Freq: Once | INTRAMUSCULAR | Status: AC
  Administered 2017-05-07: 60 mg via INTRAVENOUS
  Filled 2017-05-07: qty 2

## 2017-05-07 MED ORDER — SERTRALINE HCL 50 MG PO TABS
50.0000 mg | ORAL_TABLET | Freq: Every day | ORAL | Status: DC
  Administered 2017-05-07 – 2017-05-11 (×5): 50 mg via ORAL
  Filled 2017-05-07 (×5): qty 1

## 2017-05-07 MED ORDER — LISINOPRIL-HYDROCHLOROTHIAZIDE 10-12.5 MG PO TABS: 1.0000 | ORAL_TABLET | Freq: Every day | ORAL | Status: DC

## 2017-05-07 MED ORDER — PANTOPRAZOLE SODIUM 20 MG PO TBEC
20.0000 mg | DELAYED_RELEASE_TABLET | Freq: Every day | ORAL | Status: DC
  Administered 2017-05-07 – 2017-05-11 (×5): 20 mg via ORAL
  Filled 2017-05-07 (×5): qty 1

## 2017-05-07 MED ORDER — LEVETIRACETAM 500 MG PO TABS
500.0000 mg | ORAL_TABLET | Freq: Two times a day (BID) | ORAL | Status: DC
  Administered 2017-05-07 – 2017-05-11 (×8): 500 mg via ORAL
  Filled 2017-05-07 (×10): qty 1

## 2017-05-07 MED ORDER — OXYCODONE-ACETAMINOPHEN 5-325 MG PO TABS
1.0000 | ORAL_TABLET | ORAL | Status: DC | PRN
  Administered 2017-05-07 (×2): 1 via ORAL
  Filled 2017-05-07 (×2): qty 1

## 2017-05-07 MED ORDER — BACLOFEN 20 MG PO TABS
20.0000 mg | ORAL_TABLET | Freq: Four times a day (QID) | ORAL | Status: DC
  Administered 2017-05-07 – 2017-05-11 (×17): 20 mg via ORAL
  Filled 2017-05-07: qty 1
  Filled 2017-05-07: qty 2
  Filled 2017-05-07: qty 1
  Filled 2017-05-07: qty 2
  Filled 2017-05-07 (×8): qty 1
  Filled 2017-05-07: qty 2
  Filled 2017-05-07: qty 1
  Filled 2017-05-07: qty 2
  Filled 2017-05-07 (×2): qty 1
  Filled 2017-05-07 (×2): qty 2
  Filled 2017-05-07: qty 1
  Filled 2017-05-07: qty 2
  Filled 2017-05-07 (×4): qty 1
  Filled 2017-05-07: qty 2

## 2017-05-07 MED ORDER — POTASSIUM CHLORIDE 10 MEQ/100ML IV SOLN
10.0000 meq | INTRAVENOUS | Status: AC
  Administered 2017-05-07 (×4): 10 meq via INTRAVENOUS
  Filled 2017-05-07 (×4): qty 100

## 2017-05-07 MED ORDER — GABAPENTIN 600 MG PO TABS
600.0000 mg | ORAL_TABLET | Freq: Four times a day (QID) | ORAL | Status: DC
  Administered 2017-05-07 – 2017-05-11 (×16): 600 mg via ORAL
  Filled 2017-05-07 (×17): qty 1

## 2017-05-07 MED ORDER — OXYCODONE HCL 5 MG PO TABS
10.0000 mg | ORAL_TABLET | ORAL | Status: DC | PRN
  Administered 2017-05-07 – 2017-05-08 (×5): 10 mg via ORAL
  Filled 2017-05-07 (×5): qty 2

## 2017-05-07 MED ORDER — OXCARBAZEPINE 300 MG PO TABS: 300.0000 mg | ORAL_TABLET | Freq: Three times a day (TID) | ORAL | Status: DC

## 2017-05-07 MED ORDER — SOFOSBUV-VELPATASV-VOXILAPREV 400-100-100 MG PO TABS
1.0000 | ORAL_TABLET | Freq: Every day | ORAL | Status: DC
  Administered 2017-05-07: 1 via ORAL

## 2017-05-07 MED ORDER — LISINOPRIL 10 MG PO TABS
10.0000 mg | ORAL_TABLET | Freq: Every day | ORAL | Status: DC
  Administered 2017-05-07 – 2017-05-08 (×2): 10 mg via ORAL
  Filled 2017-05-07 (×2): qty 1

## 2017-05-07 MED ORDER — TRIAMTERENE-HCTZ 37.5-25 MG PO CAPS
1.0000 | ORAL_CAPSULE | Freq: Every day | ORAL | Status: DC | PRN
  Filled 2017-05-07: qty 1

## 2017-05-07 MED ORDER — DALFAMPRIDINE ER 10 MG PO TB12
10.0000 mg | ORAL_TABLET | Freq: Two times a day (BID) | ORAL | Status: DC
  Administered 2017-05-07: 10 mg via ORAL

## 2017-05-07 MED ORDER — TOPIRAMATE 25 MG PO TABS
25.0000 mg | ORAL_TABLET | Freq: Two times a day (BID) | ORAL | Status: DC
  Administered 2017-05-07 – 2017-05-11 (×9): 25 mg via ORAL
  Filled 2017-05-07 (×9): qty 1

## 2017-05-07 MED ORDER — RIVAROXABAN 10 MG PO TABS
10.0000 mg | ORAL_TABLET | Freq: Every day | ORAL | Status: DC
  Administered 2017-05-07 – 2017-05-10 (×4): 10 mg via ORAL
  Filled 2017-05-07 (×5): qty 1

## 2017-05-07 MED ORDER — CEFAZOLIN SODIUM-DEXTROSE 2-4 GM/100ML-% IV SOLN
2.0000 g | Freq: Three times a day (TID) | INTRAVENOUS | Status: DC
  Administered 2017-05-07 – 2017-05-08 (×4): 2 g via INTRAVENOUS
  Filled 2017-05-07 (×6): qty 100

## 2017-05-07 MED ORDER — PANTOPRAZOLE SODIUM 40 MG PO TBEC: 80.0000 mg | DELAYED_RELEASE_TABLET | Freq: Every day | ORAL | Status: DC

## 2017-05-07 NOTE — Progress Notes (Addendum)
Patient seen and examined  56 year old male with hx of MS (wheelchair bound; normally on Tysabri infusions, but held due to Hep C tx), hepatitis C (currently on Vosevi), DVT (on Xarelto), HTN, and GERD presents to the ED for evaluation of formication. Patient reports sensations of bugs crawling around in his brain and in his RLE. Symptoms present x 3 weeks, since patient alleges drinking a glass of water with bed bugs in it. He has also been noting redness and swelling to his right lower extremity over the past 1.5 weeks. White blood cell count and CRP elevated on admission, patient started on cefazolin.   Plan MRI of the right ankle  Continue antibiotics, anticipate discharge in the next 1-2 days

## 2017-05-07 NOTE — Progress Notes (Signed)
**  Preliminary report by tech**  Right lower extremity venous duplex complete. There is no evidence of deep or superficial vein thrombosis involving the right lower extremity. All visualized vessels appear patent and compressible. There is no evidence of a Baker's cyst on the right.  05/07/17 3:36 PM Olen Cordial RVT

## 2017-05-07 NOTE — H&P (Signed)
History and Physical    [name] NWG:[id number] DOB: 15-May-1961 DOA: 05/06/2017  PCP: Fleet Contras, MD  Patient coming from: Home  I have personally briefly reviewed patient's old medical records in [facility]  Chief Complaint: Leg pain  HPI: [name] is a 56 y.o. male with medical history significant of MS (wheelchair bound, normally on Tysabri infusions, but held due to Hep C tx), hepatitis C (currently on Vosevi), DVT (on Xarelto), HTN, and GERD presents to the ED for evaluation of redness and swelling of RLE over past 3 weeks.  Was switched from coumadin to Xarelto some 3 weeks ago.  No known fevers.  Got put on ABx for cellulitis for 3 days when this initially started 1.5 weeks ago but reports "it didn't help".   ED Course: Patient with RLE erythema, most c/w cellulitis.  WBC 14k, no other SIRS.  CRB 16.5.  K 2.4.  EDP doesn't feel comfortable with attempting outpatient ABx treatment and requests admission.   Review of Systems: As per HPI otherwise 10 point review of systems negative.   Past Medical History:  Diagnosis Date  . Abdominal pain, unspecified site   . Anxiety   . Arthritis   . Benign paroxysmal positional vertigo   . Chronic back pain   . Chronic pain syndrome 01/25/2008  . Cluster headache   . Depression    takes Zoloft daily  . DVT (deep venous thrombosis) (HCC)    in the left arm '09  . Gait abnormality    "uses mobile wheelchair, but is ambulatory"  . Gallstones 02/17/2009   resolved after gallbladder surgery.  [name] GERD (gastroesophageal reflux disease)    takes Omeprazole as needed  . Headache(784.0)    cluster headaches frequently-takes Topamax daily  . History of colonoscopy   . HTN (hypertension)    takes Lisinopril,Verapamil,and Triamterene HCTZ daily  . Insomnia 11/06/2008  . Joint pain   . Joint swelling    03-07-16 "swelling of right wrist" "after a fall-xray done 03-06-16 "no fractures".  . Memory loss    no an issue at  present 03-07-16  . Multiple sclerosis (HCC)    Dx. 2005 - Dr. Tinnie Gens follows LOV 4'17 tx. Tysabri monthly IV-[facility] , [place].  [name] Nonspecific elevation of levels of transaminase or lactic acid dehydrogenase (LDH)   . Other specified visual disturbances   . Other syndromes affecting cervical region   . Pneumonia 2009  . Trigeminal neuralgia     history" Multiple sclerosis"    Past Surgical History:  Procedure Laterality Date  . CHOLECYSTECTOMY  02/20/2009  . COLONOSCOPY WITH PROPOFOL N/A 03/17/2016   Procedure: COLONOSCOPY WITH PROPOFOL;  Surgeon: [name], MD;  Location: [facility] ENDOSCOPY;  Service: Endoscopy;  Laterality: N/A;  . PORT A CATH REVISION N/A 07/06/2015   Procedure: Removal and replacement of PORT A CATH;  Surgeon: [name], MD;  Location: [facility] OR;  Service: General;  Laterality: N/A;  . PORTACATH PLACEMENT N/A 03/27/2014   Procedure: INSERTION PORT-A-CATH;  Surgeon: [name], MD;  Location: [facility] OR;  Service: General;  Laterality: N/A;     reports that he has quit smoking. His smoking use included Cigarettes. He has a 38.00 pack-year smoking history. He has never used smokeless tobacco. He reports that he drinks alcohol. He reports that he does not use drugs.  No Known Allergies  Family History  Problem Relation Age of Onset  . Cancer Father   . Diabetes Mother  Prior to Admission medications   Medication Sig Start Date End Date Taking? Authorizing Provider  baclofen (LIORESAL) 20 MG tablet Take 20 mg by mouth 4 (four) times daily.    Yes [provider]  dalfampridine 10 MG TB12 Take 1 tablet (10 mg total) by mouth 2 (two) times daily. 02/16/17  Yes Sater, Pearletha Furl, MD  Eszopiclone (ESZOPICLONE) 3 MG TABS Take 3 mg by mouth at bedtime as needed (for sleep).    Yes [provider]  gabapentin (NEURONTIN) 600 MG tablet Take 600 mg by mouth 4 (four) times daily.    Yes [provider]  levETIRAcetam (KEPPRA) 500  MG tablet Take 500 mg by mouth 2 (two) times daily.   Yes [provider]  lisinopril-hydrochlorothiazide (PRINZIDE,ZESTORETIC) 10-12.5 MG tablet Take 1 tablet by mouth daily. 11/25/16  Yes [provider]  Natalizumab (TYSABRI IV) Inject into the vein every 30 (thirty) days.   Yes [provider]  omeprazole (PRILOSEC) 40 MG capsule Take 40 mg by mouth daily. 10/20/16  Yes [provider]  Oxcarbazepine (TRILEPTAL) 300 MG tablet Take 300 mg by mouth 3 (three) times daily.   Yes [provider]  oxyCODONE-acetaminophen (PERCOCET) 10-325 MG tablet Take 1 tablet by mouth every 4 (four) hours as needed for pain.  11/28/16  Yes [provider]  sertraline (ZOLOFT) 50 MG tablet Take 50 mg by mouth daily.    Yes [provider]  Sofosbuv-Velpatasv-Voxilaprev (VOSEVI) 400-100-100 MG TABS Take 1 tablet by mouth daily with breakfast. 03/05/17  Yes Judyann Munson, MD  topiramate (TOPAMAX) 25 MG tablet Take 1 tablet (25 mg total) by mouth 2 (two) times daily. 12/19/16  Yes Mancel Bale, MD  triamterene-hydrochlorothiazide (DYAZIDE) 37.5-25 MG capsule Take by mouth daily as needed (blood pressure/edema).  12/03/16  Yes [provider]  verapamil (CALAN-SR) 120 MG CR tablet Take 1 tablet (120 mg total) by mouth at bedtime. 11/30/16  Yes Law, Alexandra M, PA-C  XARELTO 10 MG TABS tablet Take 10 mg by mouth daily. 04/09/17  Yes [provider]    Physical Exam: Vitals:   05/06/17 2300 05/06/17 2315 05/06/17 2345 05/07/17 0000  BP: 120/79 127/87 128/76 118/73  Pulse: 77 85 86 87  Resp:  20  20  SpO2: 96% 98% 96% 94%    Constitutional: NAD, calm, comfortable Eyes: PERRL, lids and conjunctivae normal ENMT: Mucous membranes are moist. Posterior pharynx clear of any exudate or lesions.Normal dentition.  Neck: normal, supple, no masses, no thyromegaly Respiratory: clear to auscultation bilaterally, no wheezing, no crackles. Normal  respiratory effort. No accessory muscle use.  Cardiovascular: Regular rate and rhythm, no murmurs / rubs / gallops. No extremity edema. 2+ pedal pulses. No carotid bruits.  Abdomen: no tenderness, no masses palpated. No hepatosplenomegaly. Bowel sounds positive.  Musculoskeletal: no clubbing / cyanosis. No joint deformity upper and lower extremities. Good ROM, no contractures. Normal muscle tone.  Skin: Erythema of RLE shin, foot:  Neurologic: CN 2-12 grossly intact. Sensation intact, DTR normal. Strength 5/5 in all 4.  Psychiatric: Normal judgment and insight. Alert and oriented x 3. Normal mood.    Labs on Admission: I have personally reviewed following labs and imaging studies  CBC:  Recent Labs Lab 05/06/17 2235  WBC 14.2*  NEUTROABS 8.1*  HGB 10.4*  HCT 31.9*  MCV 80.2  PLT 357   Basic Metabolic Panel:  Recent Labs Lab 05/06/17 2235  NA 133*  K 2.4*  CL 97*  CO2 27  GLUCOSE 120*  BUN 12  CREATININE 1.07  CALCIUM 9.2   GFR: Estimated Creatinine Clearance: 85 mL/min (by C-G formula based on SCr of 1.07 mg/dL). Liver Function Tests:  Recent Labs Lab 05/06/17 2235  AST 16  ALT 18  ALKPHOS 73  BILITOT 1.4*  PROT 7.6  ALBUMIN 3.8   No results for input(s): LIPASE, AMYLASE in the last 168 hours. No results for input(s): AMMONIA in the last 168 hours. Coagulation Profile: No results for input(s): INR, PROTIME in the last 168 hours. Cardiac Enzymes: No results for input(s): CKTOTAL, CKMB, CKMBINDEX, TROPONINI in the last 168 hours. BNP (last 3 results) No results for input(s): PROBNP in the last 8760 hours. HbA1C: No results for input(s): HGBA1C in the last 72 hours. CBG: No results for input(s): GLUCAP in the last 168 hours. Lipid Profile: No results for input(s): CHOL, HDL, LDLCALC, TRIG, CHOLHDL, LDLDIRECT in the last 72 hours. Thyroid Function Tests: No results for input(s): TSH, T4TOTAL, FREET4, T3FREE, THYROIDAB in the last 72 hours. Anemia  Panel: No results for input(s): VITAMINB12, FOLATE, FERRITIN, TIBC, IRON, RETICCTPCT in the last 72 hours. Urine analysis:    Component Value Date/Time   COLORURINE YELLOW 12/18/2016 1530   APPEARANCEUR CLEAR 12/18/2016 1530   LABSPEC 1.014 12/18/2016 1530   PHURINE 6.0 12/18/2016 1530   GLUCOSEU NEGATIVE 12/18/2016 1530   HGBUR NEGATIVE 12/18/2016 1530   BILIRUBINUR NEGATIVE 12/18/2016 1530   KETONESUR 5 (A) 12/18/2016 1530   PROTEINUR NEGATIVE 12/18/2016 1530   UROBILINOGEN 1.0 05/14/2014 2113   NITRITE NEGATIVE 12/18/2016 1530   LEUKOCYTESUR NEGATIVE 12/18/2016 1530    Radiological Exams on Admission: Dg Tibia/fibula Right  Result Date: 05/06/2017 CLINICAL DATA:  Swelling and erythema EXAM: RIGHT TIBIA AND FIBULA - 2 VIEW COMPARISON:  None. FINDINGS: There is no evidence of fracture or other focal bone lesions. Soft tissues are edematous but there is no soft tissue emphysema. IMPRESSION: No acute osseous abnormality or soft tissue emphysema. Electronically Signed   By: Deatra Robinson M.D.   On: 05/06/2017 22:16   Dg Foot 2 Views Right  Result Date: 05/06/2017 CLINICAL DATA:  Edema and erythema EXAM: RIGHT FOOT - 2 VIEW COMPARISON:  None. FINDINGS: The bones are osteopenic. There is soft tissue swelling without soft tissue emphysema. No osteolysis. No ankle effusion. IMPRESSION: No acute osseous abnormality or soft tissue emphysema. Electronically Signed   By: Deatra Robinson M.D.   On: 05/06/2017 22:19    EKG: Independently reviewed.  Assessment/Plan Principal Problem:   Cellulitis of right leg Active Problems:   Multiple sclerosis (HCC)   Hypokalemia   Essential hypertension   Chronic hepatitis C virus infection (HCC)   Chronic pain syndrome    1. Cellulitis of R leg - WBC 14k, no other SIRS 1. Cellulitis pathway 2. Ancef 3. Will get [place] to make sure he doesn't have recurrent DVT but seems unlikely on Xarelto 2. Hypokalemia - Replace K 3. HTN - continue home  meds 4. HCV - continue Vosevi 5. MS - Tysabri on hold while being treated for HCV. 6. Chronic pain syndrome - continue PRN percocet  DVT prophylaxis: Xarelto Code Status: Full Family Communication: No family in room Disposition Plan: Home after admit Consults called: None Admission status: Place in obs   Raigan Baria, Heywood Iles. DO [facility] Pager [phone]  If 7AM-7PM, please contact day team taking care of patient [email or web address] Password [email or web address]  05/07/2017, 12:42 AM

## 2017-05-08 DIAGNOSIS — M609 Myositis, unspecified: Secondary | ICD-10-CM | POA: Diagnosis present

## 2017-05-08 DIAGNOSIS — B182 Chronic viral hepatitis C: Secondary | ICD-10-CM | POA: Diagnosis present

## 2017-05-08 DIAGNOSIS — Z993 Dependence on wheelchair: Secondary | ICD-10-CM | POA: Diagnosis not present

## 2017-05-08 DIAGNOSIS — E876 Hypokalemia: Secondary | ICD-10-CM | POA: Diagnosis present

## 2017-05-08 DIAGNOSIS — Z9181 History of falling: Secondary | ICD-10-CM | POA: Diagnosis not present

## 2017-05-08 DIAGNOSIS — Z7901 Long term (current) use of anticoagulants: Secondary | ICD-10-CM | POA: Diagnosis not present

## 2017-05-08 DIAGNOSIS — Z86718 Personal history of other venous thrombosis and embolism: Secondary | ICD-10-CM | POA: Diagnosis not present

## 2017-05-08 DIAGNOSIS — G894 Chronic pain syndrome: Secondary | ICD-10-CM | POA: Diagnosis present

## 2017-05-08 DIAGNOSIS — G35 Multiple sclerosis: Secondary | ICD-10-CM | POA: Diagnosis present

## 2017-05-08 DIAGNOSIS — K219 Gastro-esophageal reflux disease without esophagitis: Secondary | ICD-10-CM | POA: Diagnosis present

## 2017-05-08 DIAGNOSIS — F329 Major depressive disorder, single episode, unspecified: Secondary | ICD-10-CM | POA: Diagnosis present

## 2017-05-08 DIAGNOSIS — G44009 Cluster headache syndrome, unspecified, not intractable: Secondary | ICD-10-CM | POA: Diagnosis present

## 2017-05-08 DIAGNOSIS — L03115 Cellulitis of right lower limb: Secondary | ICD-10-CM | POA: Diagnosis present

## 2017-05-08 DIAGNOSIS — I10 Essential (primary) hypertension: Secondary | ICD-10-CM | POA: Diagnosis present

## 2017-05-08 DIAGNOSIS — Z87891 Personal history of nicotine dependence: Secondary | ICD-10-CM | POA: Diagnosis not present

## 2017-05-08 DIAGNOSIS — R569 Unspecified convulsions: Secondary | ICD-10-CM | POA: Diagnosis present

## 2017-05-08 LAB — CBC
HEMATOCRIT: 36.4 % — AB (ref 39.0–52.0)
Hemoglobin: 11.8 g/dL — ABNORMAL LOW (ref 13.0–17.0)
MCH: 26.1 pg (ref 26.0–34.0)
MCHC: 32.4 g/dL (ref 30.0–36.0)
MCV: 80.5 fL (ref 78.0–100.0)
Platelets: 237 10*3/uL (ref 150–400)
RBC: 4.52 MIL/uL (ref 4.22–5.81)
RDW: 15.7 % — AB (ref 11.5–15.5)
WBC: 7.4 10*3/uL (ref 4.0–10.5)

## 2017-05-08 LAB — COMPREHENSIVE METABOLIC PANEL
ALT: 13 U/L — ABNORMAL LOW (ref 17–63)
AST: 14 U/L — AB (ref 15–41)
Albumin: 3.1 g/dL — ABNORMAL LOW (ref 3.5–5.0)
Alkaline Phosphatase: 68 U/L (ref 38–126)
Anion gap: 7 (ref 5–15)
BILIRUBIN TOTAL: 0.4 mg/dL (ref 0.3–1.2)
BUN: 13 mg/dL (ref 6–20)
CO2: 23 mmol/L (ref 22–32)
CREATININE: 1.19 mg/dL (ref 0.61–1.24)
Calcium: 8.6 mg/dL — ABNORMAL LOW (ref 8.9–10.3)
Chloride: 105 mmol/L (ref 101–111)
Glucose, Bld: 141 mg/dL — ABNORMAL HIGH (ref 65–99)
POTASSIUM: 3.3 mmol/L — AB (ref 3.5–5.1)
Sodium: 135 mmol/L (ref 135–145)
TOTAL PROTEIN: 6.6 g/dL (ref 6.5–8.1)

## 2017-05-08 LAB — MRSA PCR SCREENING: MRSA by PCR: POSITIVE — AB

## 2017-05-08 MED ORDER — VANCOMYCIN HCL IN DEXTROSE 1-5 GM/200ML-% IV SOLN
1000.0000 mg | Freq: Two times a day (BID) | INTRAVENOUS | Status: DC
  Administered 2017-05-08 – 2017-05-10 (×4): 1000 mg via INTRAVENOUS
  Filled 2017-05-08 (×5): qty 200

## 2017-05-08 MED ORDER — MUPIROCIN 2 % EX OINT
1.0000 "application " | TOPICAL_OINTMENT | Freq: Two times a day (BID) | CUTANEOUS | Status: DC
  Administered 2017-05-08 – 2017-05-11 (×6): 1 via NASAL
  Filled 2017-05-08: qty 22

## 2017-05-08 MED ORDER — OXYCODONE-ACETAMINOPHEN 5-325 MG PO TABS
1.0000 | ORAL_TABLET | ORAL | Status: DC | PRN
  Administered 2017-05-08 – 2017-05-10 (×8): 2 via ORAL
  Filled 2017-05-08 (×8): qty 2

## 2017-05-08 MED ORDER — POTASSIUM CHLORIDE CRYS ER 20 MEQ PO TBCR
40.0000 meq | EXTENDED_RELEASE_TABLET | Freq: Three times a day (TID) | ORAL | Status: AC
  Administered 2017-05-08 (×2): 40 meq via ORAL
  Filled 2017-05-08 (×2): qty 2

## 2017-05-08 MED ORDER — CHLORHEXIDINE GLUCONATE CLOTH 2 % EX PADS
6.0000 | MEDICATED_PAD | Freq: Every day | CUTANEOUS | Status: DC
  Administered 2017-05-09 – 2017-05-11 (×3): 6 via TOPICAL

## 2017-05-08 NOTE — Progress Notes (Signed)
[facility] PROGRESS NOTE  Kadeem Hyle ZOX:[id number] DOB: 10-15-1961 DOA: 05/06/2017   PCP: Fleet Contras, MD     Assessment/Plan: Principal Problem:   Cellulitis of right leg Active Problems:   Multiple sclerosis (HCC)   Hypokalemia   Essential hypertension   Chronic hepatitis C virus infection (HCC)   Chronic pain syndrome   56 year old male with hx of MS (wheelchair bound; normally on Tysabri infusions, but held due to Hep C tx), hepatitis C (currently on Vosevi), DVT (on Xarelto), HTN, and GERD presents to the ED for evaluation of right lower extremity pain and swelling. Patient reports sensations of bugs crawling around in his brain and in his RLE. Symptoms present x 3 weeks, since patient alleges drinking a glass of water with bed bugs in it. He has also been noting redness and swelling to his right lower extremity over the past 1.5 weeks. White blood cell count and CRP elevated on admission, patient started on cefazolin for cellulitis of the right lower extremity and the right foot.    Assessment and plan  1. Cellulitis of R leg - initial white count was elevated, now improved, started on cefazolin 6/27. Slow clinical improvement , will switch to vancomycin today.   still has difficulty walking on his right foot. CRP 16.5. No blood cultures drawn in the ER. Venous Doppler negative for DVT. Paraesthesias  described by the patient could be related to MS. MRI of the right ankle/leg to rule out drainable abscess 2. Hypokalemia - repleted aggressively, continues to be low at 3.3, continue replacement 3. HTN - blood pressure soft, hold lisinopril today 4. HCV - continue Vosevi 5. MS - Tysabri on hold while being treated for HCV. 6. Chronic pain syndrome - continue PRN percocet 7. History of DVT-on Xarelto   DVT prophylaxsis Xarelto  Code Status:  Full code   Family Communication: Discussed in detail with the patient, all imaging results, lab results explained to  the patient   Disposition Plan:   Likely 1-2 days     Consultants:  None  Procedures:  None  Antibiotics: Anti-infectives    Start     Dose/Rate Route Frequency Ordered Stop   05/08/17 0800  Sofosbuv-Velpatasv-Voxilaprev 400-100-100 MG TABS 1 tablet     1 tablet Oral Daily with breakfast 05/07/17 1231     05/07/17 1000  ceFAZolin (ANCEF) IVPB 2g/100 mL premix     2 g 200 mL/hr over 30 Minutes Intravenous Every 8 hours 05/07/17 0218     05/07/17 0800  Sofosbuv-Velpatasv-Voxilaprev 400-100-100 MG TABS 1 tablet  Status:  Discontinued     1 tablet Oral Daily with breakfast 05/07/17 0018 05/07/17 1231   05/07/17 0200  ceFAZolin (ANCEF) IVPB 1 g/50 mL premix  Status:  Discontinued     1 g 100 mL/hr over 30 Minutes Intravenous Every 8 hours 05/07/17 0022 05/07/17 0218   05/06/17 2245  vancomycin (VANCOCIN) IVPB 1000 mg/200 mL premix     1,000 mg 200 mL/hr over 60 Minutes Intravenous  Once 05/06/17 2234 05/07/17 0035         HPI/Subjective: Still has pain and difficulty weight bearing   Objective: Vitals:   05/07/17 1556 05/07/17 1559 05/07/17 2220 05/08/17 0653  BP: 98/66 111/65 121/79 (!) 116/92  Pulse: 76 72 82 68  Resp:   18 18  Temp: 97.8 F (36.6 C)  97.7 F (36.5 C) 98.6 F (37 C)  TempSrc: Oral  Oral Oral  SpO2: 95%  100% 95%    Intake/Output Summary (Last 24 hours) at 05/08/17 0932 Last data filed at 05/08/17 0912  Gross per 24 hour  Intake              880 ml  Output              700 ml  Net              180 ml    Exam:  Examination:  General exam: Appears calm and comfortable  Respiratory system: Clear to auscultation. Respiratory effort normal. Cardiovascular system: S1 & S2 heard, RRR. No JVD, murmurs, rubs, gallops or clicks. No pedal edema. Gastrointestinal system: Abdomen is nondistended, soft and nontender. No organomegaly or masses felt. Normal bowel sounds heard. Central nervous system: Alert and oriented. No focal neurological  deficits. Extremities: Erythema and swelling of the right lower extremity, dorsal aspect of the right foot Skin: No rashes, lesions or ulcers Psychiatry: Judgement and insight appear normal. Mood & affect appropriate.     Data Reviewed: I have personally reviewed following labs and imaging studies  Micro Results No results found for this or any previous visit (from the past 240 hour(s)).  Radiology Reports Dg Tibia/fibula Right  Result Date: 05/06/2017 CLINICAL DATA:  Swelling and erythema EXAM: RIGHT TIBIA AND FIBULA - 2 VIEW COMPARISON:  None. FINDINGS: There is no evidence of fracture or other focal bone lesions. Soft tissues are edematous but there is no soft tissue emphysema. IMPRESSION: No acute osseous abnormality or soft tissue emphysema. Electronically Signed   By: Deatra Robinson M.D.   On: 05/06/2017 22:16   Ct Head Wo Contrast  Result Date: 05/07/2017 CLINICAL DATA:  Headache, hypertension EXAM: CT HEAD WITHOUT CONTRAST TECHNIQUE: Contiguous axial images were obtained from the base of the skull through the vertex without intravenous contrast. COMPARISON:  Head CT 11/11/2014, brain MRI 03/18/2017 FINDINGS: Brain: No acute intracranial hemorrhage. No focal mass lesion. No CT evidence of acute infarction. No midline shift or mass effect. No hydrocephalus. Basilar cisterns are patent. Vascular: No hyperdense vessel or unexpected calcification. Skull: Normal. Negative for fracture or focal lesion. Sinuses/Orbits: Paranasal sinuses and mastoid air cells are clear. Orbits are clear. Other: None. IMPRESSION: 1. No acute intracranial findings. 2. White matter changes on MRI not well appreciated on CT. Electronically Signed   By: Genevive Bi M.D.   On: 05/07/2017 14:13   Dg Foot 2 Views Right  Result Date: 05/06/2017 CLINICAL DATA:  Edema and erythema EXAM: RIGHT FOOT - 2 VIEW COMPARISON:  None. FINDINGS: The bones are osteopenic. There is soft tissue swelling without soft tissue  emphysema. No osteolysis. No ankle effusion. IMPRESSION: No acute osseous abnormality or soft tissue emphysema. Electronically Signed   By: Deatra Robinson M.D.   On: 05/06/2017 22:19     CBC  Recent Labs Lab 05/06/17 2235 05/08/17 0401  WBC 14.2* 7.4  HGB 10.4* 11.8*  HCT 31.9* 36.4*  PLT 357 237  MCV 80.2 80.5  MCH 26.1 26.1  MCHC 32.6 32.4  RDW 15.6* 15.7*  LYMPHSABS 4.3*  --   MONOABS 1.8*  --   EOSABS 0.0  --   BASOSABS 0.0  --     Chemistries   Recent Labs Lab 05/06/17 2235 05/07/17 1022 05/08/17 0401  NA 133* 133* 135  K 2.4* 2.8* 3.3*  CL 97* 100* 105  CO2 27 27 23   GLUCOSE 120* 134* 141*  BUN 12 11 13   CREATININE  1.07 0.98 1.19  CALCIUM 9.2 8.7* 8.6*  MG  --  2.2  --   AST 16  --  14*  ALT 18  --  13*  ALKPHOS 73  --  68  BILITOT 1.4*  --  0.4   ------------------------------------------------------------------------------------------------------------------ estimated creatinine clearance is 76.5 mL/min (by C-G formula based on SCr of 1.19 mg/dL). ------------------------------------------------------------------------------------------------------------------ No results for input(s): HGBA1C in the last 72 hours. ------------------------------------------------------------------------------------------------------------------ No results for input(s): CHOL, HDL, LDLCALC, TRIG, CHOLHDL, LDLDIRECT in the last 72 hours. ------------------------------------------------------------------------------------------------------------------ No results for input(s): TSH, T4TOTAL, T3FREE, THYROIDAB in the last 72 hours.  Invalid input(s): FREET3 ------------------------------------------------------------------------------------------------------------------ No results for input(s): VITAMINB12, FOLATE, FERRITIN, TIBC, IRON, RETICCTPCT in the last 72 hours.  Coagulation profile No results for input(s): INR, PROTIME in the last 168 hours.  No results for input(s):  DDIMER in the last 72 hours.  Cardiac Enzymes No results for input(s): CKMB, TROPONINI, MYOGLOBIN in the last 168 hours.  Invalid input(s): CK ------------------------------------------------------------------------------------------------------------------ Invalid input(s): POCBNP   CBG: No results for input(s): GLUCAP in the last 168 hours.     Studies: Dg Tibia/fibula Right  Result Date: 05/06/2017 CLINICAL DATA:  Swelling and erythema EXAM: RIGHT TIBIA AND FIBULA - 2 VIEW COMPARISON:  None. FINDINGS: There is no evidence of fracture or other focal bone lesions. Soft tissues are edematous but there is no soft tissue emphysema. IMPRESSION: No acute osseous abnormality or soft tissue emphysema. Electronically Signed   By: Deatra Robinson M.D.   On: 05/06/2017 22:16   Ct Head Wo Contrast  Result Date: 05/07/2017 CLINICAL DATA:  Headache, hypertension EXAM: CT HEAD WITHOUT CONTRAST TECHNIQUE: Contiguous axial images were obtained from the base of the skull through the vertex without intravenous contrast. COMPARISON:  Head CT 11/11/2014, brain MRI 03/18/2017 FINDINGS: Brain: No acute intracranial hemorrhage. No focal mass lesion. No CT evidence of acute infarction. No midline shift or mass effect. No hydrocephalus. Basilar cisterns are patent. Vascular: No hyperdense vessel or unexpected calcification. Skull: Normal. Negative for fracture or focal lesion. Sinuses/Orbits: Paranasal sinuses and mastoid air cells are clear. Orbits are clear. Other: None. IMPRESSION: 1. No acute intracranial findings. 2. White matter changes on MRI not well appreciated on CT. Electronically Signed   By: Genevive Bi M.D.   On: 05/07/2017 14:13   Dg Foot 2 Views Right  Result Date: 05/06/2017 CLINICAL DATA:  Edema and erythema EXAM: RIGHT FOOT - 2 VIEW COMPARISON:  None. FINDINGS: The bones are osteopenic. There is soft tissue swelling without soft tissue emphysema. No osteolysis. No ankle effusion. IMPRESSION:  No acute osseous abnormality or soft tissue emphysema. Electronically Signed   By: Deatra Robinson M.D.   On: 05/06/2017 22:19      Lab Results  Component Value Date   HGBA1C 5.9 (H) 11/16/2016   HGBA1C (H) 09/28/2008    6.5 (NOTE)   The ADA recommends the following therapeutic goal for glycemic   control related to Hgb A1C measurement:   Goal of Therapy:   < 7.0% Hgb A1C   Reference: American Diabetes Association: Clinical Practice   Recommendations 2008, Diabetes Care,  2008, 31:(Suppl 1).   Lab Results  Component Value Date   CREATININE 1.19 05/08/2017       Scheduled Meds: . baclofen  20 mg Oral QID  . dalfampridine  10 mg Oral BID  . gabapentin  600 mg Oral QID  . levETIRAcetam  500 mg Oral BID  . lisinopril  10 mg Oral Daily  .  pantoprazole  20 mg Oral Daily  . potassium chloride  40 mEq Oral TID  . rivaroxaban  10 mg Oral Daily  . sertraline  50 mg Oral Daily  . Sofosbuv-Velpatasv-Voxilaprev  1 tablet Oral Q breakfast  . topiramate  25 mg Oral BID  . verapamil  120 mg Oral QHS   Continuous Infusions: .  ceFAZolin (ANCEF) IV 2 g (05/08/17 0845)     LOS: 0 days    Time spent: >30 MINS    Richarda Overlie  [facility] Pager [phone]. If 7PM-7AM, please contact night-coverage at [email or web address], password [facility] 05/08/2017, 9:32 AM  LOS: 0 days

## 2017-05-08 NOTE — Evaluation (Signed)
Physical Therapy Evaluation Patient Details Name: [name] MRN: [id number] DOB: January 19, 1961 Today's Date: 05/08/2017   History of Present Illness  56 yo admitted with RLE cellulitis. PMHx: MS, DVT, BPPV, chronic pain  Clinical Impression  Pt pleasant but apprehensive about mobility. Pt with impaired cognition and expressive difficulties who demonstrates decreased strength, mobility, transfers and gait. Pt will benefit from acute therapy to maximize mobility, function, gait and balance to decrease burden of care and return to PLOF. Pt reports family and aide assist at home but no one present to confirm and pt currently requires assist for all transfers as well as encouragement.     Follow Up Recommendations Home health PT;Supervision/Assistance - 24 hour (if family not available to assist then SNF)    Equipment Recommendations  None recommended by PT    Recommendations for Other Services OT consult     Precautions / Restrictions Precautions Precautions: Fall      Mobility  Bed Mobility Overal bed mobility: Needs Assistance Bed Mobility: Supine to Sit     Supine to sit: HOB elevated;Supervision     General bed mobility comments: pt able to transfer EOB without assist with supervision for lines  Transfers Overall transfer level: Needs assistance   Transfers: Sit to/from Stand;Stand Pivot Transfers Sit to Stand: Min guard Stand pivot transfers: Min assist       General transfer comment: cues for hand placement and safety with increased time to rise. assist to position and advance RW with stepping bed to chair. Pt with limited ability to bear weight on right foot, unable to fully unweight on bil UE and scooting feet to chair  Ambulation/Gait             General Gait Details: pt declined attempting due to pain  Stairs            Wheelchair Mobility    Modified Rankin (Stroke Patients Only)       Balance Overall balance assessment: Needs  assistance   Sitting balance-Leahy Scale: Good       Standing balance-Leahy Scale: Fair                               Pertinent Vitals/Pain Pain Assessment: Faces Faces Pain Scale: Hurts even more Pain Location: RLE Pain Descriptors / Indicators: Sore;Aching Pain Intervention(s): Limited activity within patient's tolerance;Repositioned;Monitored during session    Home Living Family/patient expects to be discharged to:: Private residence Living Arrangements: Parent;Other relatives Available Help at Discharge: Family;Personal care attendant;Available 24 hours/day Type of Home: House Home Access: Ramped entrance     Home Layout: One level Home Equipment: Walker - 2 wheels;Wheelchair - manual;Bedside commode      Prior Function Level of Independence: Needs assistance   Gait / Transfers Assistance Needed: pt reports he can normally walk a block with RW, wC use for last 2 weeks  ADL's / Homemaking Assistance Needed: has aid 7/days a week for cleaning and housework pt states he bathes and dresses himself  Comments: pt repeatedly stating "i sat up there" when trying to explain home     Hand Dominance        Extremity/Trunk Assessment   Upper Extremity Assessment Upper Extremity Assessment: Generalized weakness    Lower Extremity Assessment Lower Extremity Assessment: Generalized weakness;RLE deficits/detail RLE Deficits / Details: grossly 3/5 but initially unwilling to perform AROM limited by pain    Cervical / Trunk Assessment Cervical / Trunk Assessment:  Kyphotic  Communication   Communication: Expressive difficulties  Cognition Arousal/Alertness: Awake/alert Behavior During Therapy: WFL for tasks assessed/performed Overall Cognitive Status: No family/caregiver present to determine baseline cognitive functioning                                        General Comments      Exercises General Exercises - Lower Extremity Long Arc  Quad: AAROM;Right;10 reps;Seated Hip Flexion/Marching: AAROM;Right;10 reps;Seated   Assessment/Plan    PT Assessment Patient needs continued PT services  PT Problem List Decreased strength;Decreased mobility;Decreased safety awareness;Decreased activity tolerance;Decreased balance;Decreased knowledge of use of DME;Pain       PT Treatment Interventions Gait training;Therapeutic exercise;Patient/family education;Functional mobility training;DME instruction;Therapeutic activities;Balance training    PT Goals (Current goals can be found in the Care Plan section)  Acute Rehab PT Goals Patient Stated Goal: walk PT Goal Formulation: With patient Time For Goal Achievement: 05/22/17 Potential to Achieve Goals: Fair    Frequency Min 3X/week   Barriers to discharge Decreased caregiver support pt reports family available 24hrs a day but no one here to confirm    Co-evaluation               AM-PAC PT "6 Clicks" Daily Activity  Outcome Measure Difficulty turning over in bed (including adjusting bedclothes, sheets and blankets)?: A Little Difficulty moving from lying on back to sitting on the side of the bed? : A Little Difficulty sitting down on and standing up from a chair with arms (e.g., wheelchair, bedside commode, etc,.)?: A Lot Help needed moving to and from a bed to chair (including a wheelchair)?: A Lot Help needed walking in hospital room?: A Lot Help needed climbing 3-5 steps with a railing? : A Lot 6 Click Score: 14    End of Session Equipment Utilized During Treatment: Gait belt Activity Tolerance: Patient tolerated treatment well Patient left: in chair;with chair alarm set;with call bell/phone within reach Nurse Communication: Mobility status PT Visit Diagnosis: Difficulty in walking, not elsewhere classified (R26.2);Muscle weakness (generalized) (M62.81);Pain Pain - Right/Left: Right Pain - part of body: Leg    Time: 0981-1914 PT Time Calculation (min) (ACUTE  ONLY): 19 min   Charges:   PT Evaluation $PT Eval Moderate Complexity: 1 Procedure     PT G Codes:   PT G-Codes **NOT FOR INPATIENT CLASS** Functional Assessment Tool Used: AM-PAC 6 Clicks Basic Mobility;Clinical judgement Functional Limitation: Mobility: Walking and moving around Mobility: Walking and Moving Around Current Status (N8295): At least 20 percent but less than 40 percent impaired, limited or restricted Mobility: Walking and Moving Around Goal Status [phone]): At least 1 percent but less than 20 percent impaired, limited or restricted    Delaney Meigs, PT [phone]   Enedina Finner Sha Burling 05/08/2017, 11:48 AM

## 2017-05-08 NOTE — Progress Notes (Signed)
Pharmacy Antibiotic Note  [name] is a 56 y.o. male admitted on 05/06/2017 with cellulitis which is not improving on Cefazolin.  Pharmacy has been consulted for Vancomycin dosing.  Pt is afebrile, WBC has normalized.  Cr is 1.19, up a bit.  CrCl ~70-32ml/min.  Plan: Vancomycin 1000mg  IV q12 for goal trough 10-15 Watch renal fxn Vancomycin trough at steady state     Temp (24hrs), Avg:98 F (36.7 C), Min:97.7 F (36.5 C), Max:98.6 F (37 C)   Recent Labs Lab 05/06/17 2235 05/07/17 1022 05/08/17 0401  WBC 14.2*  --  7.4  CREATININE 1.07 0.98 1.19    Estimated Creatinine Clearance: 76.5 mL/min (by C-G formula based on SCr of 1.19 mg/dL).    No Known Allergies  Antimicrobials this admission: 6/28 Cefazolin >> 6/29 6/29 Vanc >>   Dose adjustments this admission: n/a  Microbiology results:  6/29 MRSA PCR: pending  Thank you for allowing pharmacy to be a part of this patient's care.   Alvester Morin., PharmD Clinical Pharmacist [facility]

## 2017-05-09 ENCOUNTER — Inpatient Hospital Stay (HOSPITAL_COMMUNITY): Payer: Medicare Other

## 2017-05-09 LAB — CBC
HCT: 36.1 % — ABNORMAL LOW (ref 39.0–52.0)
HEMOGLOBIN: 11.7 g/dL — AB (ref 13.0–17.0)
MCH: 26.2 pg (ref 26.0–34.0)
MCHC: 32.4 g/dL (ref 30.0–36.0)
MCV: 80.8 fL (ref 78.0–100.0)
Platelets: 271 10*3/uL (ref 150–400)
RBC: 4.47 MIL/uL (ref 4.22–5.81)
RDW: 15.8 % — ABNORMAL HIGH (ref 11.5–15.5)
WBC: 6.4 10*3/uL (ref 4.0–10.5)

## 2017-05-09 LAB — COMPREHENSIVE METABOLIC PANEL
ALBUMIN: 3.1 g/dL — AB (ref 3.5–5.0)
ALT: 13 U/L — AB (ref 17–63)
AST: 18 U/L (ref 15–41)
Alkaline Phosphatase: 71 U/L (ref 38–126)
Anion gap: 8 (ref 5–15)
BUN: 8 mg/dL (ref 6–20)
CHLORIDE: 102 mmol/L (ref 101–111)
CO2: 22 mmol/L (ref 22–32)
CREATININE: 0.96 mg/dL (ref 0.61–1.24)
Calcium: 8.8 mg/dL — ABNORMAL LOW (ref 8.9–10.3)
GFR calc non Af Amer: >60 mL/min (ref >60)
GLUCOSE: 117 mg/dL — AB (ref 65–99)
Potassium: 3.2 mmol/L — ABNORMAL LOW (ref 3.5–5.1)
SODIUM: 132 mmol/L — AB (ref 135–145)
Total Bilirubin: 0.6 mg/dL (ref 0.3–1.2)
Total Protein: 6.6 g/dL (ref 6.5–8.1)

## 2017-05-09 MED ORDER — GADOBENATE DIMEGLUMINE 529 MG/ML IV SOLN
20.0000 mL | Freq: Once | INTRAVENOUS | Status: AC
  Administered 2017-05-09: 20 mL via INTRAVENOUS

## 2017-05-09 MED ORDER — POTASSIUM CHLORIDE CRYS ER 20 MEQ PO TBCR
40.0000 meq | EXTENDED_RELEASE_TABLET | Freq: Once | ORAL | Status: AC
  Administered 2017-05-09: 40 meq via ORAL
  Filled 2017-05-09: qty 2

## 2017-05-09 NOTE — Progress Notes (Signed)
OT Cancellation Note  Patient Details Name: [name] MRN: [id number] DOB: [birth year]   Cancelled Treatment:    Reason Eval/Treat Not Completed: Patient at procedure or test/ unavailable. Currently in MRI. Will follow up for OT eval as time allows.  Gaye Alken M.S., OTR/L Pager: [phone]  05/09/2017, 8:42 AM

## 2017-05-09 NOTE — Progress Notes (Signed)
[facility] Progress Note  Patient: [name] NMM:[id number]   PCP: Nolene Ebbs, MD DOB: 27-Jan-1961   DOA: 05/06/2017   DOS: 05/09/2017   Date of Service: the patient was seen and examined on 05/09/2017  Subjective: Patient is concerned that the swelling and the redness in the right leg is not getting better. Denies any other acute complaint. He also has pain which is chronic for last 2 weeks and he mentions the pain is stabbing as well as burning. No nausea no vomiting or diarrhea, constipation. No burning urination. No headache. Has occasional sensation of bugs crawling on his leg  Brief hospital course: Pt. with PMH of multiple sclerosis, wheelchair bound, hepatitis C currently on treatment, DVT on Xarelto, HTN, GERD; admitted on 05/06/2017, presented with complaint of right leg swelling for 3 weeks not getting better with outpatient antibiotics, was found to have right leg cellulitis. Currently further plan is follow-up on MRI.  Assessment and Plan: 1. Right leg cellulitis. Presented with leukocytosis as well as pain. CRP elevated ESR elevated. No blood cultures drawn since admission. Patient was initially started on cefazolin, did not get better and was switched to IV vancomycin on 05/08/2017. No leukocytosis at present. Patient remains symptomatic without any improvement. Lower extremity Doppler is negative for DVT. MRI right ankle currently pending and we will monitor the results.  2. Multiple sclerosis. Paraesthesias of bugs crawling on the leg. Treatment is currently on hold due to treatment with H CV. His tingling on the right leg could be due to his MS, No other evidence of acute flareup at present. Continue to monitor closely. Continue Dalfampridine to improve mobility Continue gabapentin for pain control. Continue Percocet as well for home regimen for pain control.  3. History of DVT. Continue Xarelto.  4. Essential hypertension. Blood pressure is actually well  controlled, Discontinue hypertensive regimen. Continue verapamil.  5. Hepatitis C. Continuing home regimen to complete the course.  6. Hypokalemia. Replace today. Recheck tomorrow.  7. History of seizures. Continue Keppra.  Diet: Cardiac diet DVT Prophylaxis: on therapeutic anticoagulation.  Advance goals of care discussion: Full code  Family Communication: no family was present at bedside, at the time of interview.  Disposition:  Discharge to PTOT recommends home health versus SNF if no family support.  Consultants: none Procedures: none  Antibiotics: Anti-infectives    Start     Dose/Rate Route Frequency Ordered Stop   05/08/17 1200  vancomycin (VANCOCIN) IVPB 1000 mg/200 mL premix     1,000 mg 200 mL/hr over 60 Minutes Intravenous Every 12 hours 05/08/17 1142     05/08/17 0800  Sofosbuv-Velpatasv-Voxilaprev 400-100-100 MG TABS 1 tablet     1 tablet Oral Daily with breakfast 05/07/17 1231     05/07/17 1000  ceFAZolin (ANCEF) IVPB 2g/100 mL premix  Status:  Discontinued     2 g 200 mL/hr over 30 Minutes Intravenous Every 8 hours 05/07/17 0218 05/08/17 1111   05/07/17 0800  Sofosbuv-Velpatasv-Voxilaprev 400-100-100 MG TABS 1 tablet  Status:  Discontinued     1 tablet Oral Daily with breakfast 05/07/17 0018 05/07/17 1231   05/07/17 0200  ceFAZolin (ANCEF) IVPB 1 g/50 mL premix  Status:  Discontinued     1 g 100 mL/hr over 30 Minutes Intravenous Every 8 hours 05/07/17 0022 05/07/17 0218   05/06/17 2245  vancomycin (VANCOCIN) IVPB 1000 mg/200 mL premix     1,000 mg 200 mL/hr over 60 Minutes Intravenous  Once 05/06/17 2234 05/07/17 0035  Objective: Physical Exam: Vitals:   05/08/17 0653 05/08/17 1628 05/08/17 2219 05/09/17 0541  BP: (!) 116/92 (!) 105/57 103/66 107/63  Pulse: 68 78 70 69  Resp: 18 (!) _0 Temp: 98.6 F (37 C) 97.6 F (36.4 C) 98.1 F (36.7 C) 97.7 F (36.5 C)  TempSrc: Oral Oral Oral Oral  SpO2: 95% 98%  100%    Intake/Output  Summary (Last 24 hours) at 05/09/17 0902 Last data filed at 05/09/17 0542  Gross per 24 hour  Intake              480 ml  Output             1600 ml  Net            -1120 ml   There were no vitals filed for this visit. General: Alert, Awake and Oriented to Time, Place and Person. Appear in moderate distress, affect appropriate Eyes: PERRL, Conjunctiva normal ENT: Oral Mucosa clear moist. Neck: difficult to assess JVD, no Abnormal Mass Or lumps Cardiovascular: S1 and S2 Present, no Murmur, Peripheral Pulses Present Respiratory: normal respiratory effort, Bilateral Air entry equal and Decreased, no use of accessory muscle, Clear to Auscultation, no Crackles, no wheezes Abdomen: Bowel Sound present, Soft and no tenderness, no hernia Skin: right redness, no Rash, right medial ankle induration Extremities: right Pedal edema, no calf tenderness Neurologic: Grossly no focal neuro deficit. Bilaterally Equal motor strength  Data Reviewed: CBC:  Recent Labs Lab 05/06/17 2235 05/08/17 0401 05/09/17 0432  WBC 14.2* 7.4 6.4  NEUTROABS 8.1*  --   --   HGB 10.4* 11.8* 11.7*  HCT 31.9* 36.4* 36.1*  MCV 80.2 80.5 80.8  PLT 357 237 762   Basic Metabolic Panel:  Recent Labs Lab 05/06/17 2235 05/07/17 1022 05/08/17 0401 05/09/17 0432  NA 133* 133* 135 132*  K 2.4* 2.8* 3.3* 3.2*  CL 97* 100* 105 102  CO2 _1 GLUCOSE 120* 134* 141* 117*  BUN _2 CREATININE 1.07 0.98 1.19 0.96  CALCIUM 9.2 8.7* 8.6* 8.8*  MG  --  2.2  --   --     Liver Function Tests:  Recent Labs Lab 05/06/17 2235 05/08/17 0401 05/09/17 0432  AST 16 14* 18  ALT 18 13* 13*  ALKPHOS 73 68 71  BILITOT 1.4* 0.4 0.6  PROT 7.6 6.6 6.6  ALBUMIN 3.8 3.1* 3.1*   No results for input(s): LIPASE, AMYLASE in the last 168 hours. No results for input(s): AMMONIA in the last 168 hours. Coagulation Profile: No results for input(s): INR, PROTIME in the last 168 hours. Cardiac Enzymes: No results for  input(s): CKTOTAL, CKMB, CKMBINDEX, TROPONINI in the last 168 hours. BNP (last 3 results) No results for input(s): PROBNP in the last 8760 hours. CBG: No results for input(s): GLUCAP in the last 168 hours. Studies: No results found.  Scheduled Meds: . baclofen  20 mg Oral QID  . Chlorhexidine Gluconate Cloth  6 each Topical Q0600  . dalfampridine  10 mg Oral BID  . gabapentin  600 mg Oral QID  . levETIRAcetam  500 mg Oral BID  . mupirocin ointment  1 application Nasal BID  . pantoprazole  20 mg Oral Daily  . potassium chloride  40 mEq Oral Once  . rivaroxaban  10 mg Oral Daily  . sertraline  50 mg Oral Daily  . Sofosbuv-Velpatasv-Voxilaprev  1 tablet Oral Q breakfast  . topiramate  25 mg Oral BID  . verapamil  120 mg Oral QHS   Continuous Infusions: . vancomycin Stopped (05/09/17 0126)   PRN Meds: oxyCODONE-acetaminophen, sodium chloride flush, zolpidem  Time spent: 30 minutes  Author: Berle Mull, MD [facility] Pager: [phone] 05/09/2017 9:02 AM  If 7PM-7AM, please contact night-coverage at [email or web address], password [facility]

## 2017-05-09 NOTE — Evaluation (Signed)
Occupational Therapy Evaluation Patient Details Name: [name] MRN: [id number] DOB: Feb 01, 1961 Today's Date: 05/09/2017    History of Present Illness 56 yo admitted with RLE cellulitis. PMHx: MS, DVT, BPPV, chronic pain   Clinical Impression   Pt reports he required min assist for ADL PTA. Currently pt requires min guard for sit to stand from EOB and ADL. Pt presenting with pain in RLE limiting his participation in functional mobility or ADL at this time. Pt planning to d/c home with 24/7 supervision from family/caregiver. Recommending HHOT for follow up, if pt does not have 24/7 supervision at home would recommend d/c to SNF for continued rehab. Pt would benefit from continued skilled OT to address established goals.    Follow Up Recommendations  Home health OT;Supervision/Assistance - 24 hour (SNF if family unable to provide 24/7 supervision)    Equipment Recommendations  None recommended by OT    Recommendations for Other Services       Precautions / Restrictions Precautions Precautions: Fall Restrictions Weight Bearing Restrictions: No      Mobility Bed Mobility Overal bed mobility: Needs Assistance Bed Mobility: Supine to Sit;Sit to Supine     Supine to sit: Supervision Sit to supine: Supervision   General bed mobility comments: Increased time required. HOB flat with use of bed rail  Transfers Overall transfer level: Needs assistance Equipment used: Rolling walker (2 wheeled) Transfers: Sit to/from Stand Sit to Stand: Min guard         General transfer comment: Good hand placement with RW, increased time. Pt not bearing weight on RLE due to pain    Balance Overall balance assessment: Needs assistance Sitting-balance support: Feet supported;No upper extremity supported Sitting balance-Leahy Scale: Good     Standing balance support: Single extremity supported;During functional activity Standing balance-Leahy Scale: Fair                              ADL either performed or assessed with clinical judgement   ADL Overall ADL's : Needs assistance/impaired Eating/Feeding: Set up;Sitting   Grooming: Set up;Sitting   Upper Body Bathing: Minimal assistance;Sitting   Lower Body Bathing: Minimal assistance;Sit to/from stand   Upper Body Dressing : Supervision/safety;Set up;Sitting   Lower Body Dressing: Minimal assistance;Sit to/from stand Lower Body Dressing Details (indicate cue type and reason): Pt able to don socks sitting EOB               General ADL Comments: Pt required min guard assist for sit to stand at EOB to use urinal. Declining further mobility due to RLE pain     Vision         Perception     Praxis      Pertinent Vitals/Pain Pain Assessment: 0-10 Pain Score: 8  Pain Location: RLE Pain Descriptors / Indicators: Aching;Sore Pain Intervention(s): Monitored during session;Limited activity within patient's tolerance;Premedicated before session     Hand Dominance     Extremity/Trunk Assessment Upper Extremity Assessment Upper Extremity Assessment: Generalized weakness   Lower Extremity Assessment Lower Extremity Assessment: Defer to PT evaluation   Cervical / Trunk Assessment Cervical / Trunk Assessment: Kyphotic   Communication Communication Communication: Expressive difficulties   Cognition Arousal/Alertness: Awake/alert Behavior During Therapy: WFL for tasks assessed/performed Overall Cognitive Status: No family/caregiver present to determine baseline cognitive functioning  General Comments       Exercises     Shoulder Instructions      Home Living Family/patient expects to be discharged to:: Private residence Living Arrangements: Parent;Other relatives Available Help at Discharge: Family;Personal care attendant;Available 24 hours/day Type of Home: House Home Access: Ramped entrance     Home Layout: One level      Bathroom Shower/Tub: Producer, television/film/video: Standard Bathroom Accessibility: Yes How Accessible: Accessible via wheelchair Home Equipment: Walker - 2 wheels;Wheelchair - [employer];Shower seat;Grab bars - tub/shower          Prior Functioning/Environment Level of Independence: Needs assistance  Gait / Transfers Assistance Needed: pt reports he uses RW vs w/c depending on how he feels that day. Can transfer to w/c without assist ADL's / Homemaking Assistance Needed: pt reports he has aide 7 days per week for 8 hours per day. aide assists with bathing, dressing, and household tasks            OT Problem List: Decreased strength;Decreased activity tolerance;Impaired balance (sitting and/or standing);Decreased cognition;Decreased safety awareness;Decreased knowledge of use of DME or AE;Pain;Increased edema      OT Treatment/Interventions: Self-care/ADL training;Therapeutic exercise;Energy conservation;DME and/or AE instruction;Therapeutic activities;Patient/family education;Balance training    OT Goals(Current goals can be found in the care plan section) Acute Rehab OT Goals Patient Stated Goal: decrease pain OT Goal Formulation: With patient Time For Goal Achievement: 05/23/17 Potential to Achieve Goals: Good ADL Goals Pt Will Perform Grooming: with supervision;standing;sitting Pt Will Transfer to Toilet: with supervision;ambulating;bedside commode Pt Will Perform Toileting - Clothing Manipulation and hygiene: with supervision;sit to/from stand Pt Will Perform Tub/Shower Transfer: with supervision;Shower transfer;shower seat;ambulating;rolling walker  OT Frequency: Min 2X/week   Barriers to D/C:            Co-evaluation              AM-PAC PT "6 Clicks" Daily Activity     Outcome Measure Help from another person eating meals?: A Little Help from another person taking care of personal grooming?: A Little Help from another person toileting,  which includes using toliet, bedpan, or urinal?: A Little Help from another person bathing (including washing, rinsing, drying)?: A Little Help from another person to put on and taking off regular upper body clothing?: A Little Help from another person to put on and taking off regular lower body clothing?: A Little 6 Click Score: 18   End of Session Equipment Utilized During Treatment: Rolling walker Nurse Communication: Mobility status;Other (comment) (voided 350cc)  Activity Tolerance: Patient tolerated treatment well;Patient limited by pain Patient left: in bed;with call bell/phone within reach;with bed alarm set  OT Visit Diagnosis: Unsteadiness on feet (R26.81);Other abnormalities of gait and mobility (R26.89);Muscle weakness (generalized) (M62.81);Pain Pain - Right/Left: Right Pain - part of body: Leg                Time: 1735-6701 OT Time Calculation (min): 16 min Charges:  OT General Charges $OT Visit: 1 Procedure OT Evaluation $OT Eval Moderate Complexity: 1 Procedure G-Codes:     Kirubel Aja A. Brett Albino, M.S., OTR/L Pager: [phone]  Gaye Alken 05/09/2017, 3:02 PM

## 2017-05-09 NOTE — Progress Notes (Addendum)
Patient keeps pulling scabs of his body and tells nurse he finds bugs on his skin. Patient informed there are no bugs in his room nor his skin.  Patient informed nurse to check his back. Upon checking his back I found multiple bumps and scabs on his back. I informed patient he has scabs and bumps on his back.  Patient also informed nurse he thinks the bugs are under his skin and they are crawling under his skin

## 2017-05-10 LAB — BASIC METABOLIC PANEL
Anion gap: 7 (ref 5–15)
BUN: 8 mg/dL (ref 6–20)
CALCIUM: 8.9 mg/dL (ref 8.9–10.3)
CO2: 22 mmol/L (ref 22–32)
CREATININE: 0.86 mg/dL (ref 0.61–1.24)
Chloride: 109 mmol/L (ref 101–111)
GFR calc Af Amer: >60 mL/min (ref >60)
GLUCOSE: 104 mg/dL — AB (ref 65–99)
Potassium: 3.3 mmol/L — ABNORMAL LOW (ref 3.5–5.1)
Sodium: 138 mmol/L (ref 135–145)

## 2017-05-10 LAB — CBC
HEMATOCRIT: 35.3 % — AB (ref 39.0–52.0)
Hemoglobin: 11.1 g/dL — ABNORMAL LOW (ref 13.0–17.0)
MCH: 25.4 pg — ABNORMAL LOW (ref 26.0–34.0)
MCHC: 31.4 g/dL (ref 30.0–36.0)
MCV: 80.8 fL (ref 78.0–100.0)
PLATELETS: 276 10*3/uL (ref 150–400)
RBC: 4.37 MIL/uL (ref 4.22–5.81)
RDW: 15.6 % — AB (ref 11.5–15.5)
WBC: 5.3 10*3/uL (ref 4.0–10.5)

## 2017-05-10 LAB — MAGNESIUM: Magnesium: 2.2 mg/dL (ref 1.7–2.4)

## 2017-05-10 MED ORDER — POTASSIUM CHLORIDE CRYS ER 20 MEQ PO TBCR
20.0000 meq | EXTENDED_RELEASE_TABLET | Freq: Two times a day (BID) | ORAL | Status: DC
  Administered 2017-05-10 – 2017-05-11 (×3): 20 meq via ORAL
  Filled 2017-05-10 (×3): qty 1

## 2017-05-10 MED ORDER — DOXYCYCLINE HYCLATE 100 MG IV SOLR
100.0000 mg | Freq: Two times a day (BID) | INTRAVENOUS | Status: DC
  Administered 2017-05-10 – 2017-05-11 (×3): 100 mg via INTRAVENOUS
  Filled 2017-05-10 (×4): qty 100

## 2017-05-10 MED ORDER — SACCHAROMYCES BOULARDII 250 MG PO CAPS
250.0000 mg | ORAL_CAPSULE | Freq: Two times a day (BID) | ORAL | Status: DC
  Administered 2017-05-10 – 2017-05-11 (×3): 250 mg via ORAL
  Filled 2017-05-10 (×3): qty 1

## 2017-05-10 NOTE — Discharge Instructions (Signed)
Information on my medicine - XARELTO (rivaroxaban)  WHY WAS XARELTO PRESCRIBED FOR YOU? Xarelto was prescribed to treat blood clots that may have been found in the veins of your legs (deep vein thrombosis) or in your lungs (pulmonary embolism) and to reduce the risk of them occurring again.  What do you need to know about Xarelto?  DO NOT stop taking Xarelto without talking to the health care provider who prescribed the medication.  Refill your prescription for 20 mg tablets before you run out.  After discharge, you should have regular check-up appointments with your healthcare provider that is prescribing your Xarelto.  In the future your dose may need to be changed if your kidney function changes by a significant amount.  What do you do if you miss a dose? If you are taking Xarelto ONCE DAILY and you miss a dose, take it as soon as you remember on the same day then continue your regularly scheduled once daily regimen the next day. Do not take two doses of Xarelto at the same time.   Important Safety Information Xarelto is a blood thinner medicine that can cause bleeding. You should call your healthcare provider right away if you experience any of the following: ? Bleeding from an injury or your nose that does not stop. ? Unusual colored urine (red or dark brown) or unusual colored stools (red or black). ? Unusual bruising for unknown reasons. ? A serious fall or if you hit your head (even if there is no bleeding).  Some medicines may interact with Xarelto and might increase your risk of bleeding while on Xarelto. To help avoid this, consult your healthcare provider or pharmacist prior to using any new prescription or non-prescription medications, including herbals, vitamins, non-steroidal anti-inflammatory drugs (NSAIDs) and supplements.  This website has more information on Xarelto: [email or web address].com.br.

## 2017-05-10 NOTE — Progress Notes (Addendum)
[facility] Progress Note  Patient: [name] KDX:[id number]   PCP: Nolene Ebbs, MD DOB: 1961/03/07   DOA: 05/06/2017   DOS: 05/10/2017   Date of Service: the patient was seen and examined on 05/10/2017  Subjective: primary C/o burning in the right foot. No nausea vomiting, fever, chest pain or abdominal pain.   Brief hospital course: Pt. with PMH of multiple sclerosis, wheelchair bound, hepatitis C currently on treatment, DVT on Xarelto, HTN, GERD; admitted on 05/06/2017, presented with complaint of right leg swelling for 3 weeks not getting better with outpatient antibiotics, was found to have right leg cellulitis. Currently further plan is monitor overnight on Antibiotics and arrange SNF  Assessment and Plan: 1. Right leg cellulitis. Presented with leukocytosis as well as pain. CRP elevated ESR elevated. No blood cultures drawn since admission. Patient was initially started on cefazolin, did not get better and was switched to IV vancomycin on 05/08/2017. No leukocytosis at present. Patient remains symptomatic without any improvement. Lower extremity Doppler is negative for DVT. MRI right ankle shows no abscess or septic arthritis.  Redness is actually better, right leg still warmer then left. Sensation of Burning should get better over period of time. Will switch him to doxycycline and monitor overnight, if gets better, plan to go to SNF tomorrow  2. Multiple sclerosis. Paraesthesias of burning on leg and bugs crawling on the leg. Treatment is currently on hold due to treatment with H CV. His tingling on the right leg could be due to his MS, No other evidence of acute flareup at present. Continue to monitor closely. Continue Dalfampridine to improve mobility Continue gabapentin for pain control. Already on high dose. Continue Percocet as well for home regimen for pain control.  3. History of DVT. Continue Xarelto.  4. Essential hypertension. Blood pressure is actually  well controlled, Discontinue hypertensive regimen. Continue verapamil.  5. Hepatitis C. Continuing home regimen to complete the course.  6. Hypokalemia. Magnesium normal.  No evidence of GI loss.  Now on scheduled Potassium   7. History of seizures. Continue Keppra.  Diet: Cardiac diet DVT Prophylaxis: on therapeutic anticoagulation.  Advance goals of care discussion: Full code  Family Communication: no family was present at bedside, at the time of interview.  Disposition:  Discharge to [facility] recommends home health versus SNF if no family support. Pt mentions he has no family support and would prefer SNF, social worker consulted.   Consultants: none Procedures: none  Antibiotics: Anti-infectives    Start     Dose/Rate Route Frequency Ordered Stop   05/08/17 1200  vancomycin (VANCOCIN) IVPB 1000 mg/200 mL premix     1,000 mg 200 mL/hr over 60 Minutes Intravenous Every 12 hours 05/08/17 1142     05/08/17 0800  Sofosbuv-Velpatasv-Voxilaprev 400-100-100 MG TABS 1 tablet     1 tablet Oral Daily with breakfast 05/07/17 1231     05/07/17 1000  ceFAZolin (ANCEF) IVPB 2g/100 mL premix  Status:  Discontinued     2 g 200 mL/hr over 30 Minutes Intravenous Every 8 hours 05/07/17 0218 05/08/17 1111   05/07/17 0800  Sofosbuv-Velpatasv-Voxilaprev 400-100-100 MG TABS 1 tablet  Status:  Discontinued     1 tablet Oral Daily with breakfast 05/07/17 0018 05/07/17 1231   05/07/17 0200  ceFAZolin (ANCEF) IVPB 1 g/50 mL premix  Status:  Discontinued     1 g 100 mL/hr over 30 Minutes Intravenous Every 8 hours 05/07/17 0022 05/07/17 0218   05/06/17 2245  vancomycin (VANCOCIN) IVPB 1000  mg/200 mL premix     1,000 mg 200 mL/hr over 60 Minutes Intravenous  Once 05/06/17 2234 05/07/17 0035       Objective: Physical Exam: Vitals:   05/09/17 0541 05/09/17 1825 05/09/17 2228 05/10/17 0537  BP: 107/63 116/69 108/79 (!) 90/53  Pulse: 69 88 67 63  Resp: '18 18 18 16  '$ Temp: 97.7 F (36.5 C) 98.9 F  (37.2 C) 97.5 F (36.4 C) 98.3 F (36.8 C)  TempSrc: Oral Oral Oral Oral  SpO2: 100% 98% 99% 97%    Intake/Output Summary (Last 24 hours) at 05/10/17 0902 Last data filed at 05/09/17 2230  Gross per 24 hour  Intake              920 ml  Output              650 ml  Net              270 ml   General: Alert, Awake and Oriented to Time, Place and Person. Appear in mild distress, affect appropriate Eyes: PERRL, Conjunctiva normal ENT: Oral Mucosa clear moist. Neck: no JVD, no Abnormal Mass Or lumps Cardiovascular: S1 and S2 Present, no Murmur, Peripheral Pulses Present Respiratory: normal respiratory effort, Bilateral Air entry equal and Decreased, no use of accessory muscle, Clear to Auscultation, no Crackles, no wheezes Abdomen: Bowel Sound present, Soft and no tenderness, no hernia Skin: improving right leg redness, no Rash, no induration Extremities: right Pedal edema, no calf tenderness Neurologic: Grossly no focal neuro deficit. Bilaterally Equal motor strength  Data Reviewed: CBC:  Recent Labs Lab 05/06/17 2235 05/08/17 0401 05/09/17 0432 05/10/17 0427  WBC 14.2* 7.4 6.4 5.3  NEUTROABS 8.1*  --   --   --   HGB 10.4* 11.8* 11.7* 11.1*  HCT 31.9* 36.4* 36.1* 35.3*  MCV 80.2 80.5 80.8 80.8  PLT 357 237 271 449   Basic Metabolic Panel:  Recent Labs Lab 05/06/17 2235 05/07/17 1022 05/08/17 0401 05/09/17 0432 05/10/17 0427  NA 133* 133* 135 132* 138  K 2.4* 2.8* 3.3* 3.2* 3.3*  CL 97* 100* 105 102 109  CO2 '27 27 23 22 22  '$ GLUCOSE 120* 134* 141* 117* 104*  BUN '12 11 13 8 8  '$ CREATININE 1.07 0.98 1.19 0.96 0.86  CALCIUM 9.2 8.7* 8.6* 8.8* 8.9  MG  --  2.2  --   --  2.2    Liver Function Tests:  Recent Labs Lab 05/06/17 2235 05/08/17 0401 05/09/17 0432  AST 16 14* 18  ALT 18 13* 13*  ALKPHOS 73 68 71  BILITOT 1.4* 0.4 0.6  PROT 7.6 6.6 6.6  ALBUMIN 3.8 3.1* 3.1*   No results for input(s): LIPASE, AMYLASE in the last 168 hours. No results for  input(s): AMMONIA in the last 168 hours. Coagulation Profile: No results for input(s): INR, PROTIME in the last 168 hours. Cardiac Enzymes: No results for input(s): CKTOTAL, CKMB, CKMBINDEX, TROPONINI in the last 168 hours. BNP (last 3 results) No results for input(s): PROBNP in the last 8760 hours. CBG: No results for input(s): GLUCAP in the last 168 hours. Studies: Mr Tibia Fibula Right Wo Contrast  Result Date: 05/09/2017 CLINICAL DATA:  Right lower extremity pain and swelling for 3 weeks. EXAM: MRI OF LOWER RIGHT EXTREMITY WITHOUT CONTRAST; MRI OF THE RIGHT ANKLE WITHOUT AND WITH CONTRAST TECHNIQUE: Multiplanar, multisequence MR imaging of the right lower extremity was performed. No intravenous contrast was administered for the right tibia/ fibula. Contrast was  administered for the ankle examination. CONTRAST:  20 cc MultiHance. COMPARISON:  05/06/2017 radiographs FINDINGS: Diffuse subcutaneous soft tissue swelling/ edema/ fluid involving the right lower extremity suggesting cellulitis. There is also moderate edema like signal abnormality in the anterior tibialis muscle suggesting myositis. No discrete drainable soft tissue abscess is identified. The knee and ankle joints are maintained. No findings for septic arthritis. No findings for osteomyelitis involving the right tibia or fibula. Diffuse cellulitis and myositis involving the foot and ankle. No findings for discrete drainable soft tissue abscess or pyomyositis. No evidence of septic arthritis or osteomyelitis. IMPRESSION: Cellulitis and myositis but no discrete drainable soft tissue abscess or pyomyositis. No MR findings to suggest septic arthritis or osteomyelitis. Electronically Signed   By: Marijo Sanes M.D.   On: 05/09/2017 11:23   Mr Ankle Right W Wo Contrast  Result Date: 05/09/2017 CLINICAL DATA:  Right lower extremity pain and swelling for 3 weeks. EXAM: MRI OF LOWER RIGHT EXTREMITY WITHOUT CONTRAST; MRI OF THE RIGHT ANKLE WITHOUT  AND WITH CONTRAST TECHNIQUE: Multiplanar, multisequence MR imaging of the right lower extremity was performed. No intravenous contrast was administered for the right tibia/ fibula. Contrast was administered for the ankle examination. CONTRAST:  20 cc MultiHance. COMPARISON:  05/06/2017 radiographs FINDINGS: Diffuse subcutaneous soft tissue swelling/ edema/ fluid involving the right lower extremity suggesting cellulitis. There is also moderate edema like signal abnormality in the anterior tibialis muscle suggesting myositis. No discrete drainable soft tissue abscess is identified. The knee and ankle joints are maintained. No findings for septic arthritis. No findings for osteomyelitis involving the right tibia or fibula. Diffuse cellulitis and myositis involving the foot and ankle. No findings for discrete drainable soft tissue abscess or pyomyositis. No evidence of septic arthritis or osteomyelitis. IMPRESSION: Cellulitis and myositis but no discrete drainable soft tissue abscess or pyomyositis. No MR findings to suggest septic arthritis or osteomyelitis. Electronically Signed   By: Marijo Sanes M.D.   On: 05/09/2017 11:23    Scheduled Meds: . baclofen  20 mg Oral QID  . Chlorhexidine Gluconate Cloth  6 each Topical Q0600  . dalfampridine  10 mg Oral BID  . gabapentin  600 mg Oral QID  . levETIRAcetam  500 mg Oral BID  . mupirocin ointment  1 application Nasal BID  . pantoprazole  20 mg Oral Daily  . potassium chloride  20 mEq Oral BID  . rivaroxaban  10 mg Oral Daily  . sertraline  50 mg Oral Daily  . Sofosbuv-Velpatasv-Voxilaprev  1 tablet Oral Q breakfast  . topiramate  25 mg Oral BID  . verapamil  120 mg Oral QHS   Continuous Infusions: . vancomycin Stopped (05/10/17 0146)   PRN Meds: oxyCODONE-acetaminophen, sodium chloride flush, zolpidem  Time spent: 35 minutes  Author: Berle Mull, MD [facility] Pager: [phone] 05/10/2017 9:02 AM  If 7PM-7AM, please contact  night-coverage at [email or web address], password [facility]

## 2017-05-11 DIAGNOSIS — G894 Chronic pain syndrome: Secondary | ICD-10-CM

## 2017-05-11 LAB — BASIC METABOLIC PANEL
ANION GAP: 6 (ref 5–15)
BUN: 11 mg/dL (ref 6–20)
CALCIUM: 8.8 mg/dL — AB (ref 8.9–10.3)
CO2: 21 mmol/L — AB (ref 22–32)
Chloride: 113 mmol/L — ABNORMAL HIGH (ref 101–111)
Creatinine, Ser: 0.82 mg/dL (ref 0.61–1.24)
GFR calc non Af Amer: >60 mL/min (ref >60)
GLUCOSE: 95 mg/dL (ref 65–99)
POTASSIUM: 3.8 mmol/L (ref 3.5–5.1)
Sodium: 140 mmol/L (ref 135–145)

## 2017-05-11 LAB — CBC
HEMATOCRIT: 35.4 % — AB (ref 39.0–52.0)
HEMOGLOBIN: 11.3 g/dL — AB (ref 13.0–17.0)
MCH: 25.9 pg — ABNORMAL LOW (ref 26.0–34.0)
MCHC: 31.9 g/dL (ref 30.0–36.0)
MCV: 81.2 fL (ref 78.0–100.0)
Platelets: 273 10*3/uL (ref 150–400)
RBC: 4.36 MIL/uL (ref 4.22–5.81)
RDW: 15.9 % — AB (ref 11.5–15.5)
WBC: 5.9 10*3/uL (ref 4.0–10.5)

## 2017-05-11 MED ORDER — HEPARIN SOD (PORK) LOCK FLUSH 100 UNIT/ML IV SOLN
500.0000 [IU] | INTRAVENOUS | Status: DC | PRN
  Administered 2017-05-11: 500 [IU]
  Filled 2017-05-11: qty 5

## 2017-05-11 MED ORDER — OXYCODONE-ACETAMINOPHEN 10-325 MG PO TABS: 1.0000 | ORAL_TABLET | ORAL | 0 refills | Status: DC | PRN

## 2017-05-11 MED ORDER — SACCHAROMYCES BOULARDII 250 MG PO CAPS: 250.0000 mg | ORAL_CAPSULE | Freq: Two times a day (BID) | ORAL | 0 refills | Status: DC

## 2017-05-11 MED ORDER — POTASSIUM CHLORIDE CRYS ER 20 MEQ PO TBCR: 20.0000 meq | EXTENDED_RELEASE_TABLET | Freq: Every day | ORAL | 0 refills | Status: DC

## 2017-05-11 MED ORDER — HEPARIN SOD (PORK) LOCK FLUSH 100 UNIT/ML IV SOLN
500.0000 [IU] | INTRAVENOUS | Status: DC
  Filled 2017-05-11: qty 5

## 2017-05-11 MED ORDER — DOXYCYCLINE HYCLATE 50 MG PO CAPS: 100.0000 mg | ORAL_CAPSULE | Freq: Two times a day (BID) | ORAL | 0 refills | Status: AC

## 2017-05-11 NOTE — Care Management (Signed)
Case manager arranged for [facility] to transport patient to his home.

## 2017-05-11 NOTE — Discharge Summary (Addendum)
Physician Discharge Summary  Halford Goetzke MRN: [id number] DOB/AGE: 1961-02-20 56 y.o.  PCP: Nolene Ebbs, MD   Admit date: 05/06/2017 Discharge date: 05/11/2017  Discharge Diagnoses:    Principal Problem:   Cellulitis of right leg Active Problems:   Multiple sclerosis (HCC)   Hypokalemia   Essential hypertension   Chronic hepatitis C virus infection (HCC)   Chronic pain syndrome    Follow-up recommendations Follow-up with PCP in 3-5 days , including all  additional recommended appointments as below Follow-up CBC, CMP in 3-5 days       Current Discharge Medication List    START taking these medications   Details  doxycycline (VIBRAMYCIN) 50 MG capsule Take 2 capsules (100 mg total) by mouth 2 (two) times daily. Qty: 40 capsule, Refills: 0    potassium chloride SA (K-DUR,KLOR-CON) 20 MEQ tablet Take 1 tablet (20 mEq total) by mouth daily. Qty: 30 tablet, Refills: 0    saccharomyces boulardii (FLORASTOR) 250 MG capsule Take 1 capsule (250 mg total) by mouth 2 (two) times daily. Qty: 30 capsule, Refills: 0      CONTINUE these medications which have CHANGED   Details  oxyCODONE-acetaminophen (PERCOCET) 10-325 MG tablet Take 1 tablet by mouth every 4 (four) hours as needed for pain. Qty: 5 tablet, Refills: 0      CONTINUE these medications which have NOT CHANGED   Details  baclofen (LIORESAL) 20 MG tablet Take 20 mg by mouth 4 (four) times daily.     dalfampridine 10 MG TB12 Take 1 tablet (10 mg total) by mouth 2 (two) times daily. Qty: 60 tablet, Refills: 11    Eszopiclone (ESZOPICLONE) 3 MG TABS Take 3 mg by mouth at bedtime as needed (for sleep).     gabapentin (NEURONTIN) 600 MG tablet Take 600 mg by mouth 4 (four) times daily.     levETIRAcetam (KEPPRA) 500 MG tablet Take 500 mg by mouth 2 (two) times daily. Refills: 5    Natalizumab (TYSABRI IV) Inject into the vein every 30 (thirty) days.    omeprazole (PRILOSEC) 40 MG capsule Take 40 mg by mouth  daily. Refills: 4    Oxcarbazepine (TRILEPTAL) 300 MG tablet Take 300 mg by mouth 3 (three) times daily.    sertraline (ZOLOFT) 50 MG tablet Take 50 mg by mouth daily.     Sofosbuv-Velpatasv-Voxilaprev (VOSEVI) 400-100-100 MG TABS Take 1 tablet by mouth daily with breakfast. Qty: 28 tablet, Refills: 84    topiramate (TOPAMAX) 25 MG tablet Take 1 tablet (25 mg total) by mouth 2 (two) times daily. Qty: 30 tablet, Refills: 0    verapamil (CALAN-SR) 120 MG CR tablet Take 1 tablet (120 mg total) by mouth at bedtime. Qty: 30 tablet, Refills: 0    XARELTO 10 MG TABS tablet Take 10 mg by mouth daily. Refills: 2      STOP taking these medications     lisinopril-hydrochlorothiazide (PRINZIDE,ZESTORETIC) 10-12.5 MG tablet      triamterene-hydrochlorothiazide (DYAZIDE) 37.5-25 MG capsule          Discharge Condition: Stable  Discharge Instructions Get Medicines reviewed and adjusted: Please take all your medications with you for your next visit with your Primary MD  Please request your Primary MD to go over all hospital tests and procedure/radiological results at the follow up, please ask your Primary MD to get all Hospital records sent to his/her office.  If you experience worsening of your admission symptoms, develop shortness of breath, life threatening emergency, suicidal or homicidal thoughts  you must seek medical attention immediately by calling 911 or calling your MD immediately if symptoms less severe.  You must read complete instructions/literature along with all the possible adverse reactions/side effects for all the Medicines you take and that have been prescribed to you. Take any new Medicines after you have completely understood and accpet all the possible adverse reactions/side effects.   Do not drive when taking Pain medications.   Do not take more than prescribed Pain, Sleep and Anxiety Medications  Special Instructions: If you have smoked or chewed Tobacco in the  last 2 yrs please stop smoking, stop any regular Alcohol and or any Recreational drug use.  Wear Seat belts while driving.  Please note  You were cared for by a hospitalist during your hospital stay. Once you are discharged, your primary care physician will handle any further medical issues. Please note that NO REFILLS for any discharge medications will be authorized once you are discharged, as it is imperative that you return to your primary care physician (or establish a relationship with a primary care physician if you do not have one) for your aftercare needs so that they can reassess your need for medications and monitor your lab values.     No Known Allergies    Disposition: SNF   Consults:  None    Significant Diagnostic Studies:  Dg Tibia/fibula Right  Result Date: 05/06/2017 CLINICAL DATA:  Swelling and erythema EXAM: RIGHT TIBIA AND FIBULA - 2 VIEW COMPARISON:  None. FINDINGS: There is no evidence of fracture or other focal bone lesions. Soft tissues are edematous but there is no soft tissue emphysema. IMPRESSION: No acute osseous abnormality or soft tissue emphysema. Electronically Signed   By: Ulyses Jarred M.D.   On: 05/06/2017 22:16   Ct Head Wo Contrast  Result Date: 05/07/2017 CLINICAL DATA:  Headache, hypertension EXAM: CT HEAD WITHOUT CONTRAST TECHNIQUE: Contiguous axial images were obtained from the base of the skull through the vertex without intravenous contrast. COMPARISON:  Head CT 11/11/2014, brain MRI 03/18/2017 FINDINGS: Brain: No acute intracranial hemorrhage. No focal mass lesion. No CT evidence of acute infarction. No midline shift or mass effect. No hydrocephalus. Basilar cisterns are patent. Vascular: No hyperdense vessel or unexpected calcification. Skull: Normal. Negative for fracture or focal lesion. Sinuses/Orbits: Paranasal sinuses and mastoid air cells are clear. Orbits are clear. Other: None. IMPRESSION: 1. No acute intracranial findings. 2. White  matter changes on MRI not well appreciated on CT. Electronically Signed   By: Suzy Bouchard M.D.   On: 05/07/2017 14:13   Mr Tibia Fibula Right Wo Contrast  Result Date: 05/09/2017 CLINICAL DATA:  Right lower extremity pain and swelling for 3 weeks. EXAM: MRI OF LOWER RIGHT EXTREMITY WITHOUT CONTRAST; MRI OF THE RIGHT ANKLE WITHOUT AND WITH CONTRAST TECHNIQUE: Multiplanar, multisequence MR imaging of the right lower extremity was performed. No intravenous contrast was administered for the right tibia/ fibula. Contrast was administered for the ankle examination. CONTRAST:  20 cc MultiHance. COMPARISON:  05/06/2017 radiographs FINDINGS: Diffuse subcutaneous soft tissue swelling/ edema/ fluid involving the right lower extremity suggesting cellulitis. There is also moderate edema like signal abnormality in the anterior tibialis muscle suggesting myositis. No discrete drainable soft tissue abscess is identified. The knee and ankle joints are maintained. No findings for septic arthritis. No findings for osteomyelitis involving the right tibia or fibula. Diffuse cellulitis and myositis involving the foot and ankle. No findings for discrete drainable soft tissue abscess or pyomyositis. No evidence of septic  arthritis or osteomyelitis. IMPRESSION: Cellulitis and myositis but no discrete drainable soft tissue abscess or pyomyositis. No MR findings to suggest septic arthritis or osteomyelitis. Electronically Signed   By: Marijo Sanes M.D.   On: 05/09/2017 11:23   Mr Ankle Right W Wo Contrast  Result Date: 05/09/2017 CLINICAL DATA:  Right lower extremity pain and swelling for 3 weeks. EXAM: MRI OF LOWER RIGHT EXTREMITY WITHOUT CONTRAST; MRI OF THE RIGHT ANKLE WITHOUT AND WITH CONTRAST TECHNIQUE: Multiplanar, multisequence MR imaging of the right lower extremity was performed. No intravenous contrast was administered for the right tibia/ fibula. Contrast was administered for the ankle examination. CONTRAST:  20 cc  MultiHance. COMPARISON:  05/06/2017 radiographs FINDINGS: Diffuse subcutaneous soft tissue swelling/ edema/ fluid involving the right lower extremity suggesting cellulitis. There is also moderate edema like signal abnormality in the anterior tibialis muscle suggesting myositis. No discrete drainable soft tissue abscess is identified. The knee and ankle joints are maintained. No findings for septic arthritis. No findings for osteomyelitis involving the right tibia or fibula. Diffuse cellulitis and myositis involving the foot and ankle. No findings for discrete drainable soft tissue abscess or pyomyositis. No evidence of septic arthritis or osteomyelitis. IMPRESSION: Cellulitis and myositis but no discrete drainable soft tissue abscess or pyomyositis. No MR findings to suggest septic arthritis or osteomyelitis. Electronically Signed   By: Marijo Sanes M.D.   On: 05/09/2017 11:23   Dg Foot 2 Views Right  Result Date: 05/06/2017 CLINICAL DATA:  Edema and erythema EXAM: RIGHT FOOT - 2 VIEW COMPARISON:  None. FINDINGS: The bones are osteopenic. There is soft tissue swelling without soft tissue emphysema. No osteolysis. No ankle effusion. IMPRESSION: No acute osseous abnormality or soft tissue emphysema. Electronically Signed   By: Ulyses Jarred M.D.   On: 05/06/2017 22:19        There were no vitals filed for this visit.   Microbiology: Recent Results (from the past 240 hour(s))  MRSA PCR Screening     Status: Abnormal   Collection Time: 05/08/17 11:12 AM  Result Value Ref Range Status   MRSA by PCR POSITIVE (A) NEGATIVE Final    Comment:        The GeneXpert MRSA Assay (FDA approved for NASAL specimens only), is one component of a comprehensive MRSA colonization surveillance program. It is not intended to diagnose MRSA infection nor to guide or monitor treatment for MRSA infections. RESULT CALLED TO, READ BACK BY AND VERIFIED WITH: Geronimo Boot RN 15:40 05/08/17 ([email or web address])        Blood  Culture    Component Value Date/Time   SDES URINE, CATHETERIZED 11/14/2016 2350   SPECREQUEST NONE 11/14/2016 2350   CULT NO GROWTH Performed at [facility]  11/14/2016 2350   REPTSTATUS 11/16/2016 FINAL 11/14/2016 2350      Labs: Results for orders placed or performed during the hospital encounter of 05/06/17 (from the past 48 hour(s))  CBC     Status: Abnormal   Collection Time: 05/10/17  4:27 AM  Result Value Ref Range   WBC 5.3 4.0 - 10.5 K/uL   RBC 4.37 4.22 - 5.81 MIL/uL   Hemoglobin 11.1 (L) 13.0 - 17.0 g/dL   HCT 35.3 (L) 39.0 - 52.0 %   MCV 80.8 78.0 - 100.0 fL   MCH 25.4 (L) 26.0 - 34.0 pg   MCHC 31.4 30.0 - 36.0 g/dL   RDW 15.6 (H) 11.5 - 15.5 %   Platelets 276 150 - 400 K/uL  Basic  metabolic panel     Status: Abnormal   Collection Time: 05/10/17  4:27 AM  Result Value Ref Range   Sodium 138 135 - 145 mmol/L   Potassium 3.3 (L) 3.5 - 5.1 mmol/L   Chloride 109 101 - 111 mmol/L   CO2 22 22 - 32 mmol/L   Glucose, Bld 104 (H) 65 - 99 mg/dL   BUN 8 6 - 20 mg/dL   Creatinine, Ser 0.86 0.61 - 1.24 mg/dL   Calcium 8.9 8.9 - 10.3 mg/dL   GFR calc non Af Amer >60 >60 mL/min   GFR calc Af Amer >60 >60 mL/min    Comment: (NOTE) The eGFR has been calculated using the CKD EPI equation. This calculation has not been validated in all clinical situations. eGFR's persistently <60 mL/min signify possible Chronic Kidney Disease.    Anion gap 7 5 - 15  Magnesium     Status: None   Collection Time: 05/10/17  4:27 AM  Result Value Ref Range   Magnesium 2.2 1.7 - 2.4 mg/dL  CBC     Status: Abnormal   Collection Time: 05/11/17  4:03 AM  Result Value Ref Range   WBC 5.9 4.0 - 10.5 K/uL   RBC 4.36 4.22 - 5.81 MIL/uL   Hemoglobin 11.3 (L) 13.0 - 17.0 g/dL   HCT 35.4 (L) 39.0 - 52.0 %   MCV 81.2 78.0 - 100.0 fL   MCH 25.9 (L) 26.0 - 34.0 pg   MCHC 31.9 30.0 - 36.0 g/dL   RDW 15.9 (H) 11.5 - 15.5 %   Platelets 273 150 - 400 K/uL  Basic metabolic panel     Status:  Abnormal   Collection Time: 05/11/17  4:03 AM  Result Value Ref Range   Sodium 140 135 - 145 mmol/L   Potassium 3.8 3.5 - 5.1 mmol/L   Chloride 113 (H) 101 - 111 mmol/L   CO2 21 (L) 22 - 32 mmol/L   Glucose, Bld 95 65 - 99 mg/dL   BUN 11 6 - 20 mg/dL   Creatinine, Ser 0.82 0.61 - 1.24 mg/dL   Calcium 8.8 (L) 8.9 - 10.3 mg/dL   GFR calc non Af Amer >60 >60 mL/min   GFR calc Af Amer >60 >60 mL/min    Comment: (NOTE) The eGFR has been calculated using the CKD EPI equation. This calculation has not been validated in all clinical situations. eGFR's persistently <60 mL/min signify possible Chronic Kidney Disease.    Anion gap 6 5 - 15      Lab Results  Component Value Date   HGBA1C 5.9 (H) 11/16/2016   HGBA1C (H) 09/28/2008    6.5 (NOTE)   The ADA recommends the following therapeutic goal for glycemic   control related to Hgb A1C measurement:   Goal of Therapy:   < 7.0% Hgb A1C   Reference: American Diabetes Association: Clinical Practice   Recommendations 2008, Diabetes Care,  2008, 31:(Suppl 1).     Lab Results  Component Value Date   CREATININE 0.82 05/11/2017     HPI :  56 year old male with a history of multiple sclerosis, wheelchair-bound, hepatitis C,DVT on Xarelto, HTN, GERD; admitted on 05/06/2017, presented with complaint of right leg swelling for 3 weeks not getting better with outpatient antibiotics, was found to have right leg cellulitis.   HOSPITAL COURSE:   1. Right leg cellulitis. Presented with leukocytosis as well as pain. CRP elevated ESR elevated. No blood cultures drawn since admission. Patient was initially  started on cefazolin, did not get better and was switched to IV vancomycin on 05/08/2017. Patient has gradually improved on IV antibiotics, now switched to doxycycline 10 days Lower extremity Doppler is negative for DVT. MRI right ankle shows no abscess or septic arthritis. Images showed some cellulitis/slight myositis Patient is being discharged  to SNF for 24/ 7 care  2. Multiple sclerosis. Paraesthesias of burning on leg and bugs crawling on the leg. MS Treatment is currently on hold due to treatment with HCV. His tingling on the right leg could be due to his MS, No other evidence of acute flareup at present. Continue to monitor closely. Continue Dalfampridine to improve mobility Continue gabapentin for pain control. Already on high dose. Continue Percocet as well for home regimen for pain control.  3. History of DVT. Continue Xarelto. Currently on preventative dose  4. Essential hypertension. Blood pressure remained soft during this hospitalization Discontinued lisinopril/HCTZ. Continue verapamil  5. Hepatitis C. Continuing home regimen to complete the course.  6. Hypokalemia. Magnesium normal.  No evidence of GI loss.  Now on scheduled Potassium   7. History of seizures. Continue Keppra.   Discharge Exam:   Blood pressure 102/63, pulse 68, temperature 98 F (36.7 C), temperature source Oral, resp. rate 18, SpO2 98 %.  Cardiovascular: S1 and S2 Present, no Murmur, Peripheral Pulses Present Respiratory: normal respiratory effort, Bilateral Air entry equal and Decreased, no use of accessory muscle, Clear to Auscultation, no Crackles, no wheezes Abdomen: Bowel Sound present, Soft and no tenderness, no hernia Skin: improving right leg redness, no Rash, no induration Extremities: right Pedal edema improved, no calf tenderness Neurologic: Grossly no focal neuro deficit. Bilaterally Equal motor strength    Follow-up Information    Nolene Ebbs, MD. Call.   Specialty:  Internal Medicine Why:  Hospital follow-up in 3-5 days Contact information: [place] [address] [phone]           Signed: Reyne Dumas 05/11/2017, 10:02 AM        Time spent >1 hour

## 2017-05-11 NOTE — Care Management Note (Signed)
Case Management Note  Patient Details  Name: [name] MRN: [id number] Date of Birth: [birth year]  Subjective/Objective:   56 yr old gentleman admitted with right leg cellulitis, has hx of MS. And is wheelchair dependent.                   Action/Plan: Case manager spoken with patient concerning discharge plan. Choice for home health was offered. Referral for [facility] agency was called to Delle Reining, [facility] Liaison.  Patient says that he recieves services also through [place], CM explained that [facility] will be providing therapy.   Expected Discharge Date:   05/11/17               Expected Discharge Plan:  Home w Home Health Services  In-House Referral:  NA  Discharge planning Services  CM Consult  Post Acute Care Choice:  Home Health Choice offered to:  Patient  DME Arranged:  N/A DME Agency:  NA  HH Arranged:  PT, OT HH Agency:  [facility]  Status of Service:  Completed, signed off  If discussed at Long Length of Stay Meetings, dates discussed:    Additional Comments:  Durenda Guthrie, RN 05/11/2017, 10:57 AM

## 2017-05-11 NOTE — Progress Notes (Signed)
Pt discharged via ambulance after d/c instructions and prescription given. All questions answered. Right chest port was deaccessed by IV team. Pt's home meds were returned to patient.

## 2017-05-11 NOTE — Progress Notes (Signed)
Occupational Therapy Treatment Patient Details Name: [name] MRN: [id number] DOB: [birth year] Today's Date: 05/11/2017    History of present illness 55 yo admitted with RLE cellulitis. PMHx: MS, DVT, BPPV, chronic pain   OT comments  Pt progressing towards established goals. Pt performed toileting, hand hygiene at sink, and dressing with supervision-Min guard A. Pt demonstrating decreased activity tolerance and was SOB after functional mobility with RW and LB dressing. Educated pt on energy conservation to increase pt independence; pt verbalized understanding. Continue to recommend dc home with HHOT to increase safety and independence with ADLs and functional mobility. Answered all pt's questions in preparation for dc today.     Follow Up Recommendations  Home health OT;Supervision/Assistance - 24 hour (SNF if family unable to provide 24/7 supervision)    Equipment Recommendations  None recommended by OT    Recommendations for Other Services      Precautions / Restrictions Precautions Precautions: Fall Restrictions Weight Bearing Restrictions: No       Mobility Bed Mobility Overal bed mobility: Needs Assistance Bed Mobility: Supine to Sit     Supine to sit: Supervision     General bed mobility comments: Increased time required. HOB flat with use of bed rail  Transfers Overall transfer level: Needs assistance Equipment used: Rolling walker (2 wheeled) Transfers: Sit to/from Stand Sit to Stand: Min guard         General transfer comment: Good hand placement with RW, increased time.     Balance Overall balance assessment: Needs assistance Sitting-balance support: Feet supported;No upper extremity supported Sitting balance-Leahy Scale: Good     Standing balance support: Single extremity supported;During functional activity Standing balance-Leahy Scale: Fair                             ADL either performed or assessed with clinical judgement    ADL Overall ADL's : Needs assistance/impaired     Grooming: Wash/dry hands;Min guard;Standing           Upper Body Dressing : Supervision/safety;Set up;Sitting   Lower Body Dressing: Sit to/from stand;Min guard Lower Body Dressing Details (indicate cue type and reason): Pt performed LB dressing in preparation for dc later today. Min guard for safety in standing             Functional mobility during ADLs: Min guard;Rolling walker (Tendency to "drag" RLE as he walked) General ADL Comments: Pt performed fucntional mobility to bathroom for toileting. Demonstrates decreased control of RLE. Reliant on RW for UE support.      Vision       Perception     Praxis      Cognition Arousal/Alertness: Awake/alert Behavior During Therapy: WFL for tasks assessed/performed Overall Cognitive Status: No family/caregiver present to determine baseline cognitive functioning                                 General Comments: Pt requiring increase VCS during conversation and to answer questions.        Exercises     Shoulder Instructions       General Comments Pt confirms that he will have support at home stating "I got my woman. And my mom will be there."    Pertinent Vitals/ Pain       Pain Assessment: Faces Faces Pain Scale: Hurts even more Pain Location: RLE Pain Descriptors / Indicators: Aching;Sore Pain Intervention(s):  Monitored during session;Limited activity within patient's tolerance;Repositioned  Home Living                                          Prior Functioning/Environment              Frequency  Min 2X/week        Progress Toward Goals  OT Goals(current goals can now be found in the care plan section)  Progress towards OT goals: Progressing toward goals  Acute Rehab OT Goals Patient Stated Goal: decrease pain OT Goal Formulation: With patient Time For Goal Achievement: 05/23/17 Potential to Achieve Goals:  Good ADL Goals Pt Will Perform Grooming: with supervision;standing;sitting Pt Will Transfer to Toilet: with supervision;ambulating;bedside commode Pt Will Perform Toileting - Clothing Manipulation and hygiene: with supervision;sit to/from stand Pt Will Perform Tub/Shower Transfer: with supervision;Shower transfer;shower seat;ambulating;rolling walker  Plan Discharge plan remains appropriate    Co-evaluation                 AM-PAC PT "6 Clicks" Daily Activity     Outcome Measure   Help from another person eating meals?: None Help from another person taking care of personal grooming?: A Little Help from another person toileting, which includes using toliet, bedpan, or urinal?: A Little Help from another person bathing (including washing, rinsing, drying)?: A Little Help from another person to put on and taking off regular upper body clothing?: None Help from another person to put on and taking off regular lower body clothing?: A Little 6 Click Score: 20    End of Session Equipment Utilized During Treatment: Rolling walker  OT Visit Diagnosis: Unsteadiness on feet (R26.81);Other abnormalities of gait and mobility (R26.89);Muscle weakness (generalized) (M62.81);Pain Pain - Right/Left: Right Pain - part of body: Leg   Activity Tolerance Patient tolerated treatment well;Patient limited by pain   Patient Left with call bell/phone within reach;with bed alarm set;in chair   Nurse Communication Mobility status;Other (comment) (IV)        Time: 3567-0141 OT Time Calculation (min): 27 min  Charges: OT General Charges $OT Visit: 1 Procedure OT Treatments $Self Care/Home Management : 23-37 mins  [name] MSOT, OTR/L Acute Rehab Pager: [phone] Office: [phone]   [name] 05/11/2017, 1:24 PM

## 2017-05-12 LAB — HCV RNA QUANT: HCV Quantitative: NOT DETECTED IU/mL (ref >50)

## 2017-05-15 ENCOUNTER — Telehealth: Payer: Self-pay

## 2017-05-15 NOTE — Telephone Encounter (Signed)
-----   Message from Jeralyn Ruths, MD sent at 05/14/2017  3:54 PM EDT ----- Regarding: RE: chemo drug? Contact: [phone] That is his neurologist call to discontinue. If he is starting something else we need a new prescription.   ----- Message ----- From: Riccardo Dubin Sent: 05/14/2017   1:39 PM To: Jeralyn Ruths, MD, Synetta Shadow, # Subject: chemo drug?                                    [facility] called and said that he thinks he is NOT supposed to have the TYSABRI starting in August anymore...can you advise-thx

## 2017-05-15 NOTE — Telephone Encounter (Signed)
Called patient, he advised me that he is still on the Hep C medication and has about two weeks left. He will be seeing his neurologist Aug 8. He was told he would not be restarting the Tysabri but will be put on a new medication Ocrevus. He will have them contact us with this information and new prescription if things change.

## 2017-06-02 ENCOUNTER — Telehealth: Payer: Self-pay | Admitting: Pharmacist

## 2017-06-02 ENCOUNTER — Other Ambulatory Visit: Payer: Self-pay | Admitting: Pharmacist

## 2017-06-02 ENCOUNTER — Inpatient Hospital Stay: Payer: Medicare Other | Attending: Oncology

## 2017-06-02 DIAGNOSIS — G35 Multiple sclerosis: Secondary | ICD-10-CM | POA: Insufficient documentation

## 2017-06-02 DIAGNOSIS — Z452 Encounter for adjustment and management of vascular access device: Secondary | ICD-10-CM | POA: Diagnosis not present

## 2017-06-02 DIAGNOSIS — B1920 Unspecified viral hepatitis C without hepatic coma: Secondary | ICD-10-CM | POA: Insufficient documentation

## 2017-06-02 DIAGNOSIS — F419 Anxiety disorder, unspecified: Secondary | ICD-10-CM | POA: Diagnosis not present

## 2017-06-02 DIAGNOSIS — F329 Major depressive disorder, single episode, unspecified: Secondary | ICD-10-CM | POA: Diagnosis not present

## 2017-06-02 DIAGNOSIS — Z87891 Personal history of nicotine dependence: Secondary | ICD-10-CM | POA: Insufficient documentation

## 2017-06-02 DIAGNOSIS — G4700 Insomnia, unspecified: Secondary | ICD-10-CM | POA: Diagnosis not present

## 2017-06-02 DIAGNOSIS — K219 Gastro-esophageal reflux disease without esophagitis: Secondary | ICD-10-CM | POA: Diagnosis not present

## 2017-06-02 DIAGNOSIS — Z95828 Presence of other vascular implants and grafts: Secondary | ICD-10-CM

## 2017-06-02 DIAGNOSIS — I10 Essential (primary) hypertension: Secondary | ICD-10-CM | POA: Insufficient documentation

## 2017-06-02 DIAGNOSIS — Z79899 Other long term (current) drug therapy: Secondary | ICD-10-CM | POA: Insufficient documentation

## 2017-06-02 MED ORDER — HEPARIN SOD (PORK) LOCK FLUSH 100 UNIT/ML IV SOLN
500.0000 [IU] | Freq: Once | INTRAVENOUS | Status: AC
Start: 2017-06-02 — End: 2017-06-02
  Administered 2017-06-02: 500 [IU] via INTRAVENOUS

## 2017-06-02 MED ORDER — SODIUM CHLORIDE 0.9% FLUSH
10.0000 mL | INTRAVENOUS | Status: DC | PRN
  Administered 2017-06-02: 10 mL via INTRAVENOUS
  Filled 2017-06-02: qty 10

## 2017-06-02 NOTE — Telephone Encounter (Signed)
[name] called and wanted to tell us that he is finished with his Vosevi for Hep C.  He wants documentation from [place] saying that he has cleared it and cured to show his neurologist so that he can start medication for his MS.  I told him that we could not tell if he is cured until at least 6 months from now as he is a treatment failure and F4. He is coming Thursday for an EOT viral load.  I told him we cannot tell him he is cured until he gets repeat labs in 6 months.  I told him he could show the paperwork to his neurologist and his neurologist can make the decision at that time.  He wasn't happy but understands.

## 2017-06-04 ENCOUNTER — Ambulatory Visit (INDEPENDENT_AMBULATORY_CARE_PROVIDER_SITE_OTHER): Payer: 59 | Admitting: Pharmacist Clinician (PhC)/ Clinical Pharmacy Specialist

## 2017-06-04 ENCOUNTER — Ambulatory Visit: Payer: Self-pay

## 2017-06-04 DIAGNOSIS — Z23 Encounter for immunization: Secondary | ICD-10-CM | POA: Diagnosis not present

## 2017-06-04 DIAGNOSIS — B182 Chronic viral hepatitis C: Secondary | ICD-10-CM | POA: Diagnosis not present

## 2017-06-04 LAB — COMPLETE METABOLIC PANEL WITH GFR
ALBUMIN: 4.2 g/dL (ref 3.6–5.1)
ALK PHOS: 85 U/L (ref 40–115)
ALT: 19 U/L (ref 9–46)
AST: 16 U/L (ref 10–35)
BILIRUBIN TOTAL: 0.4 mg/dL (ref 0.2–1.2)
BUN: 9 mg/dL (ref 7–25)
CO2: 24 mmol/L (ref 20–31)
Calcium: 9.4 mg/dL (ref 8.6–10.3)
Chloride: 105 mmol/L (ref 98–110)
Creat: 1.02 mg/dL (ref 0.70–1.33)
GFR, Est African American: >89 mL/min (ref >=60)
GFR, Est Non African American: 82 mL/min (ref >=60)
GLUCOSE: 96 mg/dL (ref 65–99)
POTASSIUM: 3.2 mmol/L — AB (ref 3.5–5.3)
SODIUM: 141 mmol/L (ref 135–146)
TOTAL PROTEIN: 6.9 g/dL (ref 6.1–8.1)

## 2017-06-04 NOTE — Progress Notes (Signed)
HPI: [name] is a 56 y.o. male who is here for f/u with pharmacy for his EOT hep C re-treatment.   Lab Results  Component Value Date   HCVGENOTYPE 2b 07/08/2016    Allergies: No Known Allergies  Vitals:    Past Medical History: Past Medical History:  Diagnosis Date  . Abdominal pain, unspecified site   . Anxiety   . Arthritis   . Benign paroxysmal positional vertigo   . Cellulitis and abscess of right leg 04/2017  . Chronic back pain   . Chronic pain syndrome 01/25/2008  . Cluster headache   . Depression    takes Zoloft daily  . DVT (deep venous thrombosis) (HCC)    in the left arm '09  . Gait abnormality    "uses mobile wheelchair, but is ambulatory"  . Gallstones 02/17/2009   resolved after gallbladder surgery.  [name] GERD (gastroesophageal reflux disease)    takes Omeprazole as needed  . Headache(784.0)    cluster headaches frequently-takes Topamax daily  . History of colonoscopy   . HTN (hypertension)    takes Lisinopril,Verapamil,and Triamterene HCTZ daily  . Insomnia 11/06/2008  . Joint pain   . Joint swelling    03-07-16 "swelling of right wrist" "after a fall-xray done 03-06-16 "no fractures".  . Memory loss    no an issue at present 03-07-16  . Multiple sclerosis (HCC)    Dx. 2005 - Dr. Tinnie Gens follows LOV 4'17 tx. Tysabri monthly IV-[facility] , [place].  [name] Nonspecific elevation of levels of transaminase or lactic acid dehydrogenase (LDH)   . Other specified visual disturbances   . Other syndromes affecting cervical region   . Pneumonia 2009  . Trigeminal neuralgia     history" Multiple sclerosis"    Social History: Social History   Social History  . Marital status: Divorced    Spouse name: N/A  . Number of children: 3  . Years of education: 50 th   Occupational History  .      Disabled   Social History Main Topics  . Smoking status: Former Smoker    Packs/day: 1.00    Years: 38.00    Types: Cigarettes  . Smokeless  tobacco: Never Used     Comment: cutting back  . Alcohol use 0.0 oz/week     Comment: occasional  . Drug use: No     Comment: Quit 2011  . Sexual activity: Not on file   Other Topics Concern  . Not on file   Social History Narrative   Patient is divorced. Patient is disabled. Because of his MS. Patient has 11 th grade education. 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    Right handed.   Caffeine- One daily.    Labs: Hepatitis B Surface Ag (no units)  Date Value  05/28/2016 Negative   HCV Ab (no units)  Date Value  07/08/2016 REACTIVE (A)    Lab Results  Component Value Date   HCVGENOTYPE 2b 07/08/2016    Hepatitis C RNA quantitative Latest Ref Rng & Units 05/11/2017 04/08/2017 12/30/2016 07/08/2016 07/08/2016  HCV Quantitative >50 IU/mL HCV Not Detected <15 NOT DETECTED 17,600,000(H) 1,610,960(A) 5,409,811(B)  HCV Quantitative Log NOT DETECTED Log IU/mL - <1.18 NOT DETECTED 7.25(H) 6.90(H) 6.90(H)    AST (U/L)  Date Value  05/09/2017 18  05/08/2017 14 (L)  05/06/2017 16   ALT (U/L)  Date Value  05/09/2017 13 (L)  05/08/2017 13 (L)  05/06/2017  18  09/05/2016 40   INR (no units)  Date Value  12/19/2016 1.33  12/18/2016 1.40  12/17/2016 1.97    CrCl: CrCl cannot be calculated (Patient's most recent lab result is older than the maximum 21 days allowed.).  Fibrosis Score: F4 as assessed by Fibrosure  Child-Pugh Score: Class A  Previous Treatment Regimen: Epclusa  Assessment: Isahia has now finished with his 12 wks re-treatment of his hep C after failing 12 wks of Epclusa. He was recently hospitalized for leg cellulitis. He was started on Ancef before transitioning to PO doxy at discharge. He stated that it's much better now. Fortunately, while inpatient, he did not missed doses of his Vosevi. He stated that he didn't take his Trileptal or prilosec while his on therapy. While he was being admitted, I ordered a second hep C VL.  It's was also undetectable at that time also. Therefore, today will be the EOT VL. He is very anxious about whether we can push up the cure labs. Explained to him that we can not confirm cure until we can at least get the SVR12 (at minimum) and the [facility]. He understands it better now. Hopefully, he'll be cured this time. Dr. Leotis Shames will need to make the call on the timing for his MS treatment. He asked [place] to fax the results of the labs today to his office.  He is not immune to hep A. There is no contraindication with the vaccine in MS patients. He agreed to start the series today. Appts are set up for the SVR12 and SVR24.  Recommendations:  EOT hep C VL today SVR12 in 3 months SVR24 in 6 months Hep A #1 today  [name] Pham, [address].D., BCPS, AAHIVP Clinical Infectious Disease Pharmacist [facility] 06/04/2017, 3:19 PM

## 2017-06-04 NOTE — Patient Instructions (Signed)
We will get labs today and it'll be back in about 4 days Come back in Oct for the lab visit then in Jan for the confirm cure labs

## 2017-06-08 ENCOUNTER — Ambulatory Visit: Payer: Self-pay

## 2017-06-09 ENCOUNTER — Telehealth: Payer: Self-pay | Admitting: Pharmacist Clinician (PhC)/ Clinical Pharmacy Specialist

## 2017-06-09 ENCOUNTER — Ambulatory Visit: Payer: Self-pay

## 2017-06-09 LAB — HEPATITIS C RNA QUANTITATIVE
HCV Quantitative Log: <1.18 Log IU/mL
HCV Quantitative: <15 IU/mL

## 2017-06-09 NOTE — Telephone Encounter (Signed)
[name] asked me to call him back when the results of the EOT VL is back and fax it to Dr. Tinnie Gens in [place]. It's undetectable x3 while on therapy. Let him know that it'll be Dr. Tinnie Gens that will make the call on when to start his MS therapy. We can't confirm cure yet until he gets at least a SVR12. We are going to get a SVR24 in him also.

## 2017-06-19 ENCOUNTER — Ambulatory Visit: Payer: Self-pay

## 2017-06-23 ENCOUNTER — Ambulatory Visit: Payer: Self-pay | Admitting: Internal Medicine

## 2017-06-28 IMAGING — CR DG CHEST 2V
2 series · 2 of 2 positions shown · non-contrast
Comparison: 07/06/2015.

CLINICAL DATA: Positive PPD.

EXAM:
CHEST  2 VIEW

[w chest pa]
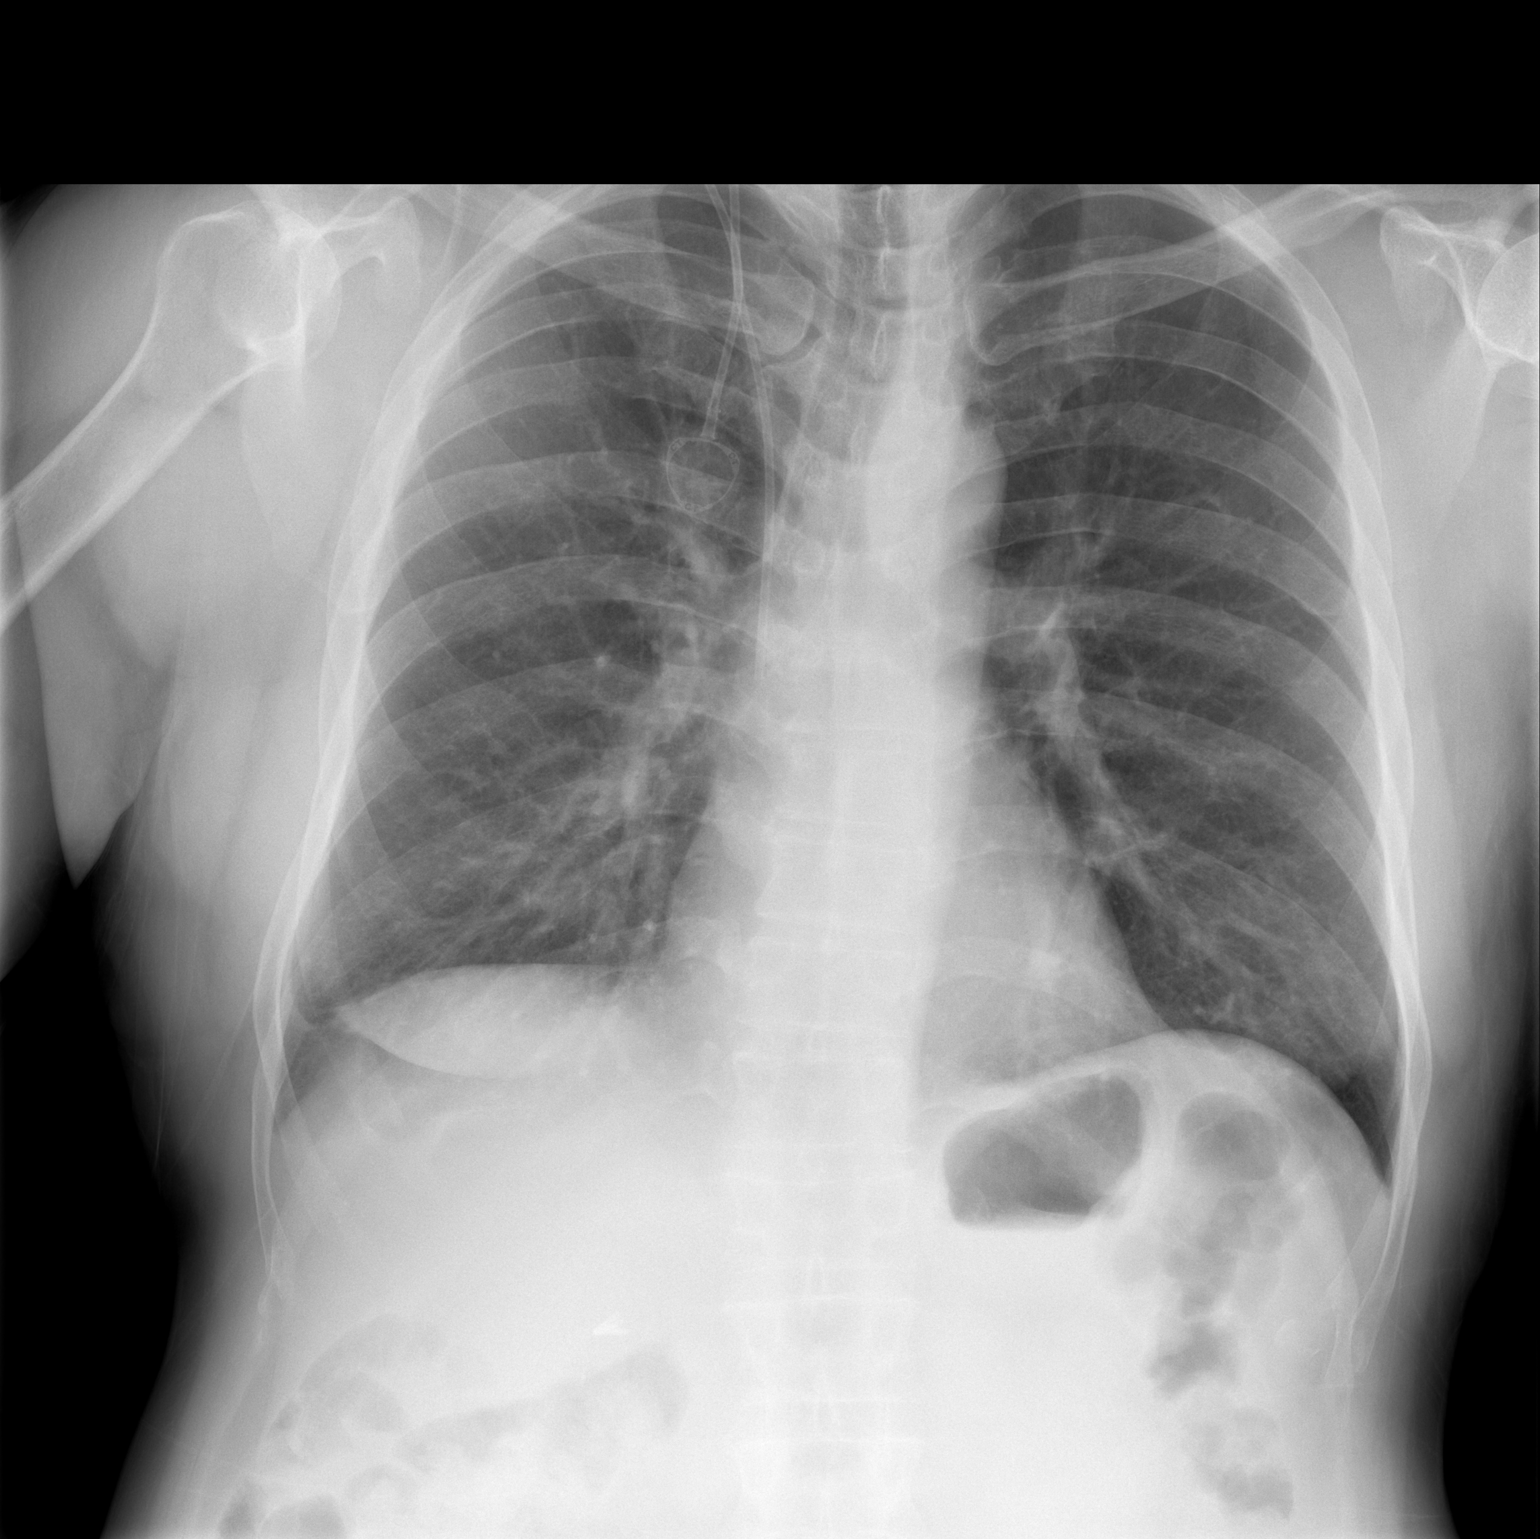

[w chest lat]
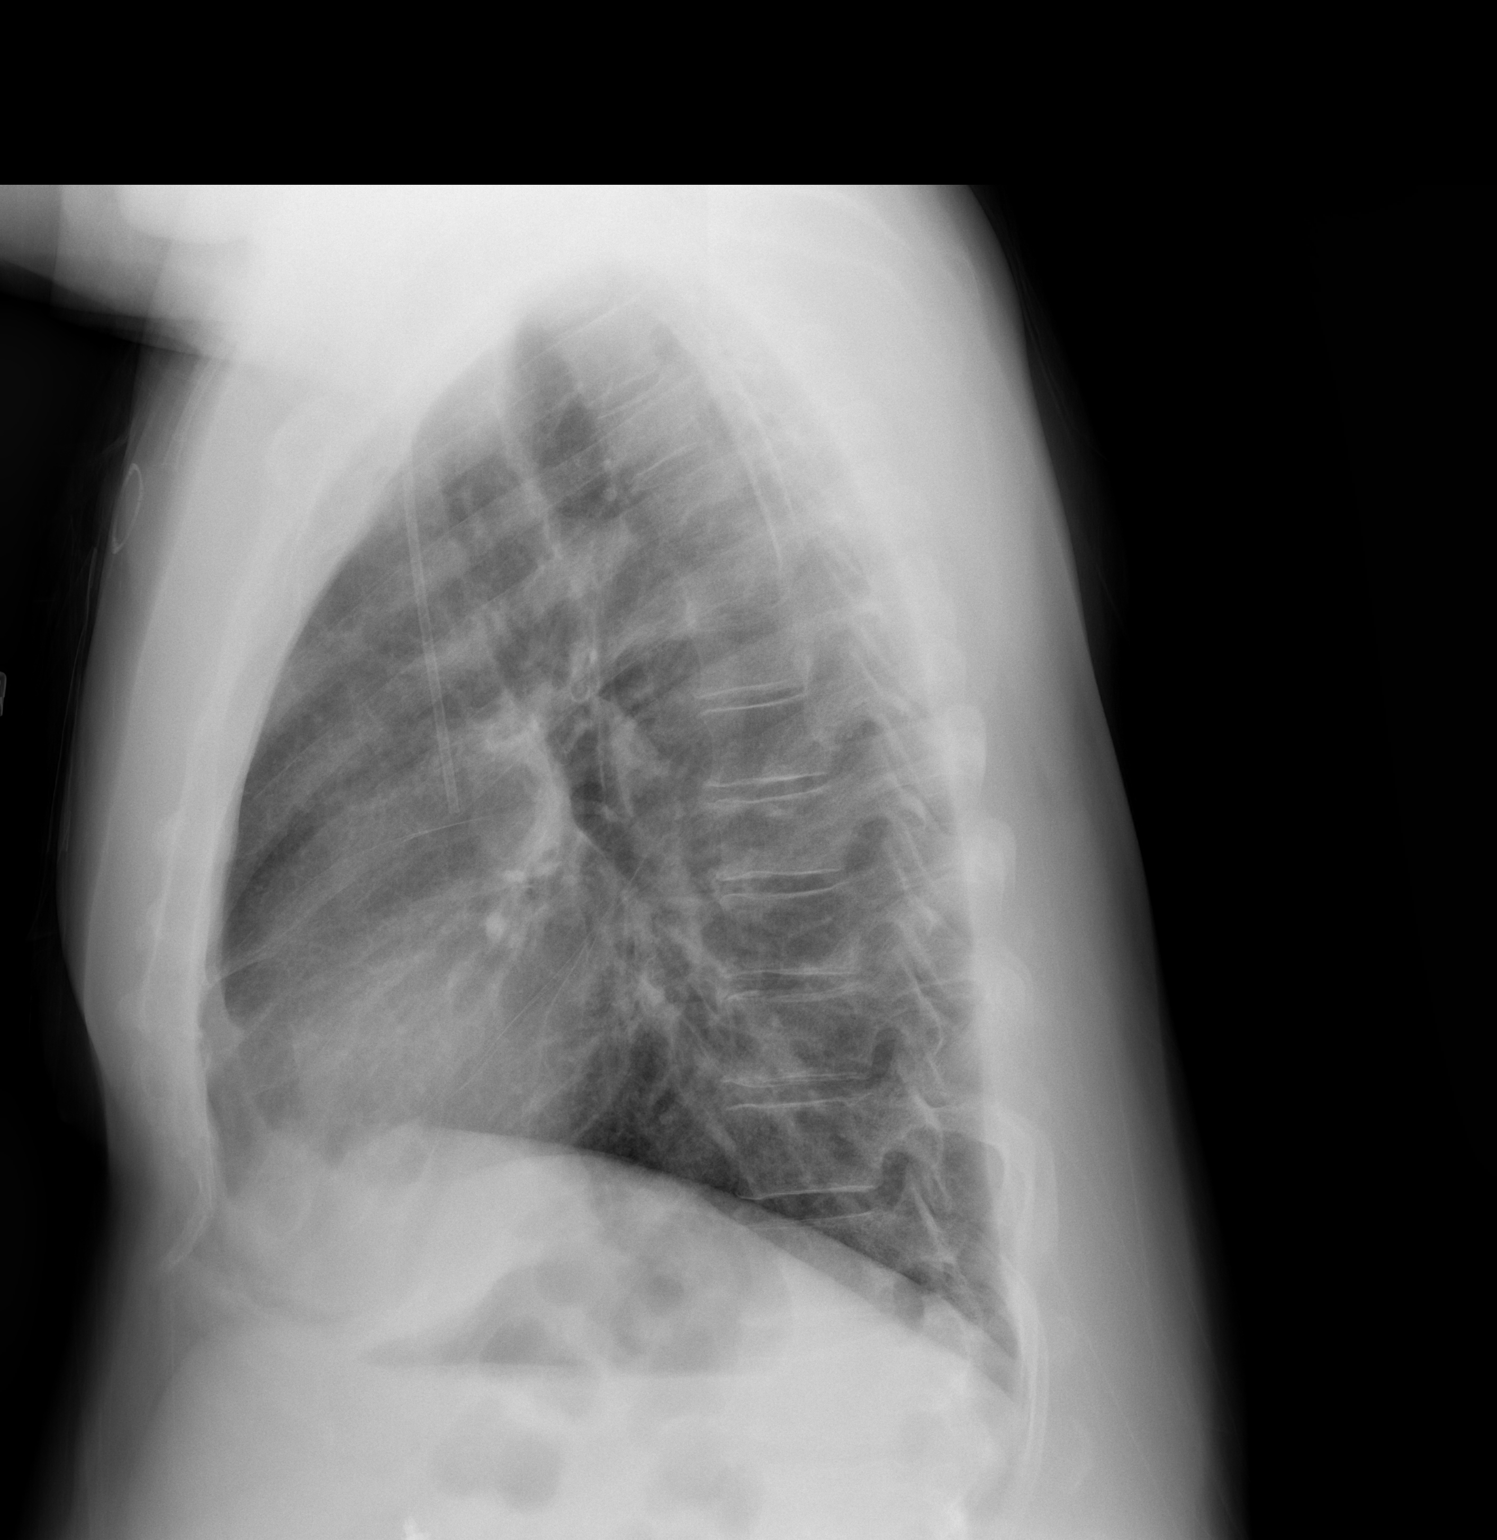

[2 of 2 positions shown; findings below may reference images not displayed]

FINDINGS: PowerPort catheter in stable position. Mediastinum hilar structures
normal. Lungs are clear. Heart size normal. Mild bibasilar
subsegmental atelectasis. No pleural effusion or pneumothorax .
IMPRESSION: 1. Power port catheter in good anatomic position.

2.  Low lung volumes with mild bibasilar subsegmental atelectasis.

## 2017-07-09 IMAGING — CR DG CHEST 2V
2 series · 2 of 2 positions shown · non-contrast
Comparison: 02/04/2016

CLINICAL DATA: Left-sided chest pain and worsening dyspnea for 2
weeks.

EXAM:
CHEST  2 VIEW

[w chest lat]
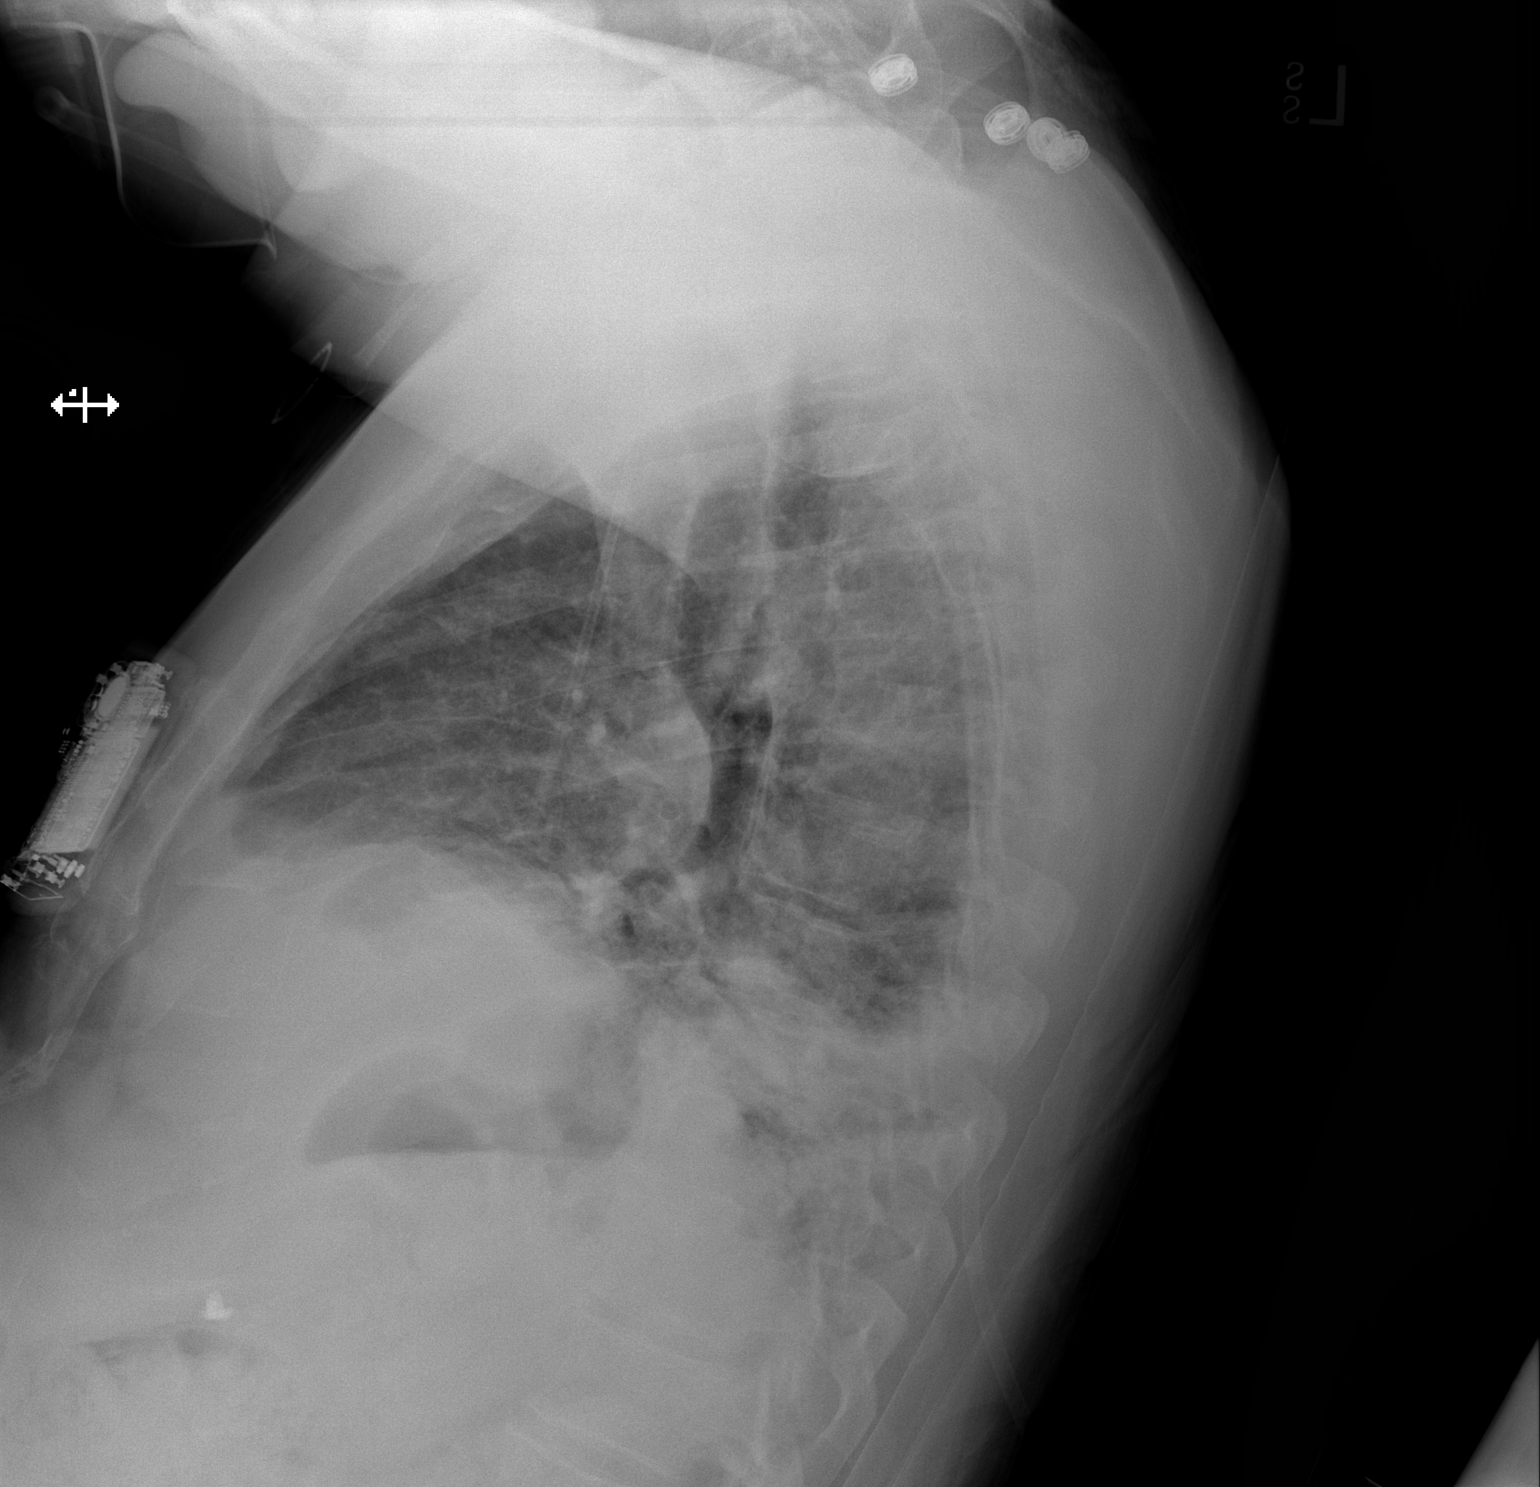

[x chest ap]
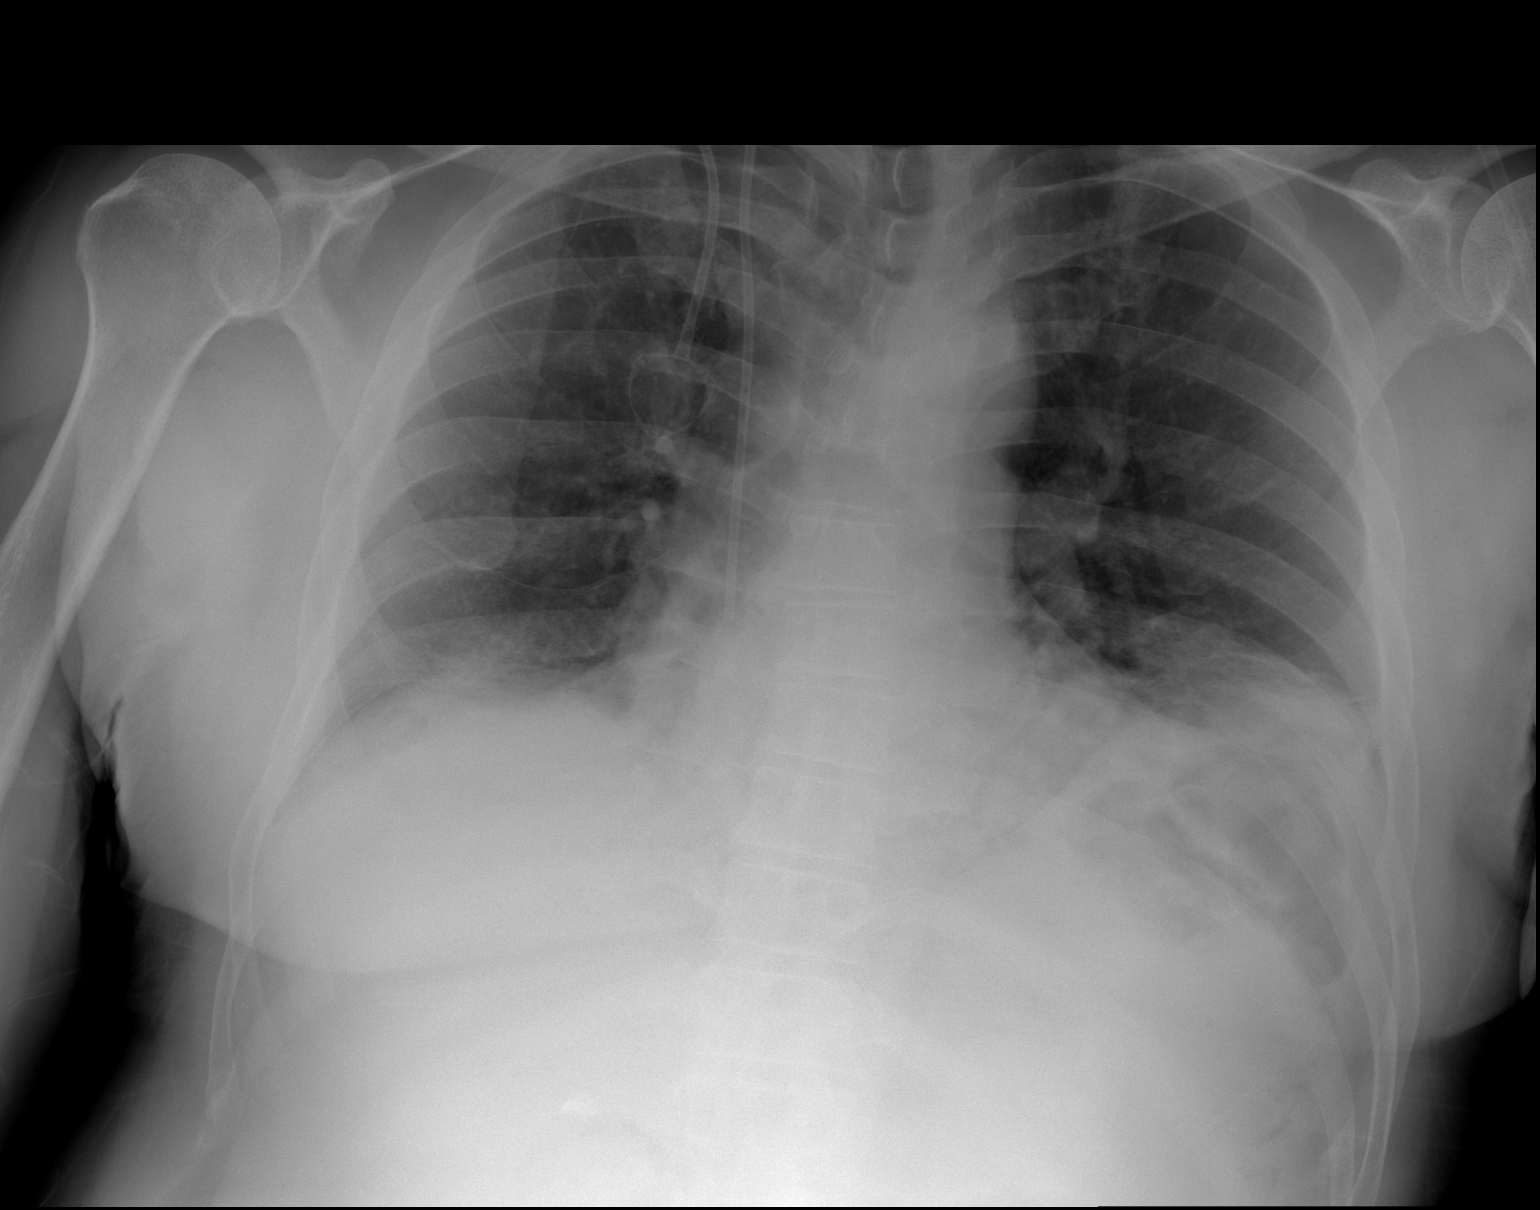

[2 of 2 positions shown; findings below may reference images not displayed]

FINDINGS: There is a right jugular power port with tip in the low SVC. There
are basilar opacities bilaterally, new. This may represent
pneumonia, particularly in the posterior lower lobes were there is
confluent consolidation. No effusion. Normal pulmonary vasculature.
IMPRESSION: Lower lobe consolidation bilaterally, suspicious for pneumonia.
Followup PA and lateral chest X-ray is recommended in 3-4 weeks
following trial of antibiotic therapy to ensure resolution and
exclude underlying malignancy.

## 2017-07-10 ENCOUNTER — Ambulatory Visit: Payer: Self-pay | Admitting: Oncology

## 2017-07-10 IMAGING — CT CT HEAD W/O CM
2 series · 16 of 30 positions shown, 20 images · non-contrast
Comparison: 08/23/2015

CLINICAL DATA: Change in headache pattern.

EXAM:
CT HEAD WITHOUT CONTRAST
TECHNIQUE: Contiguous axial images were obtained from the base of the skull
through the vertex without intravenous contrast.

[Series 2: head w/o · axial · non-contrast · 0.45mm/px · z∈[+1494,+1639]mm · 13 of 35 slices shown, 17 images]
[im 3/35  brain]
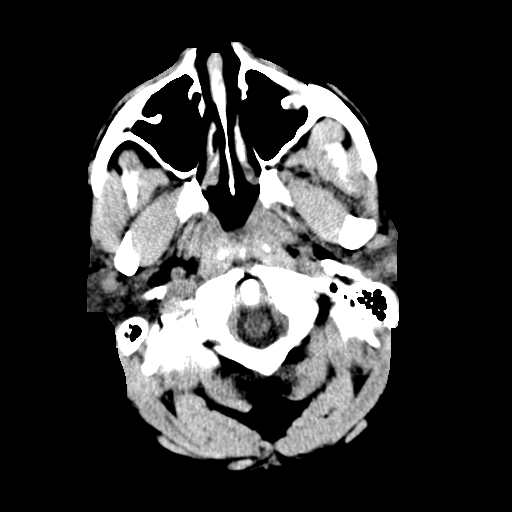
[im 3/35  bone]
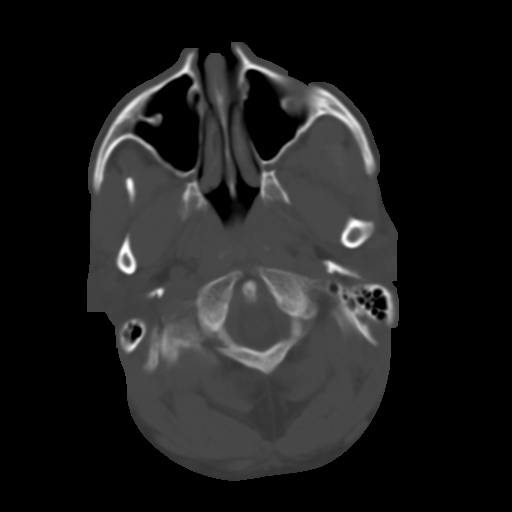
[im 5/35  brain]
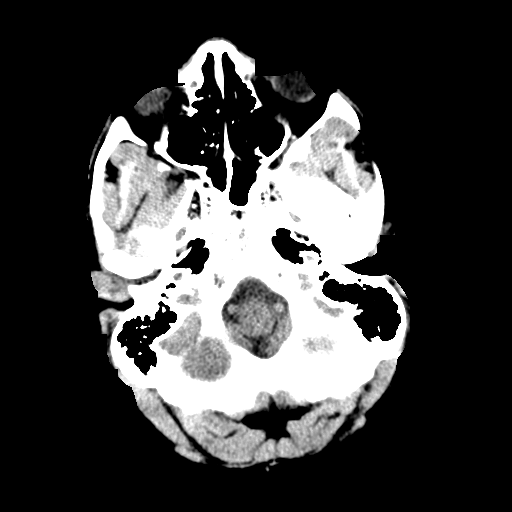
[im 8/35  brain]
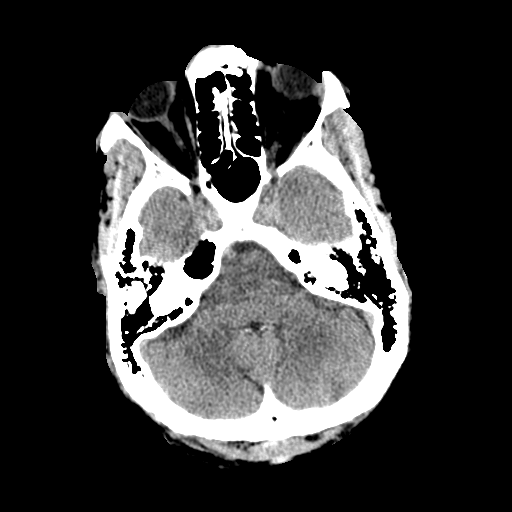
[im 10/35  brain]
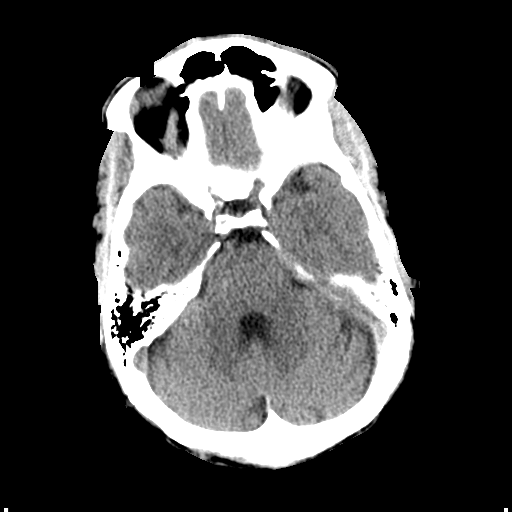
[im 13/35  brain]
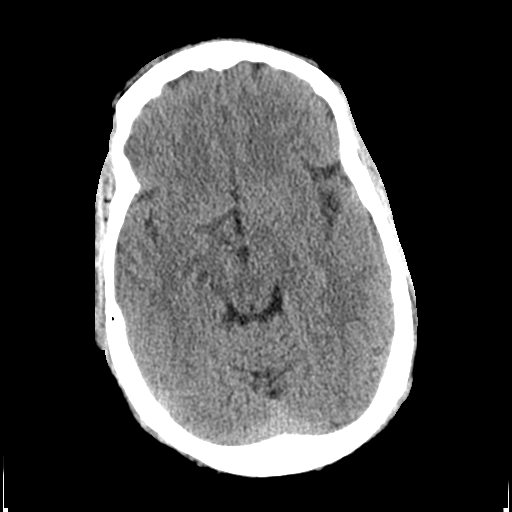
[im 13/35  bone]
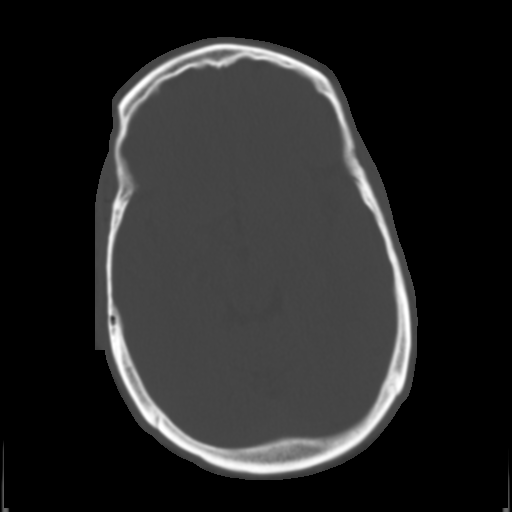
[im 15/35  brain]
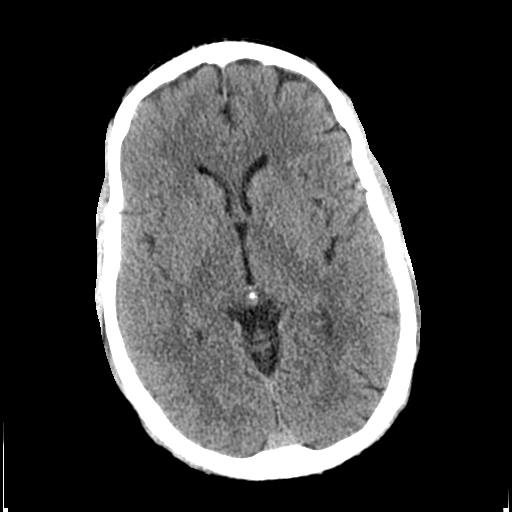
[im 18/35  brain]
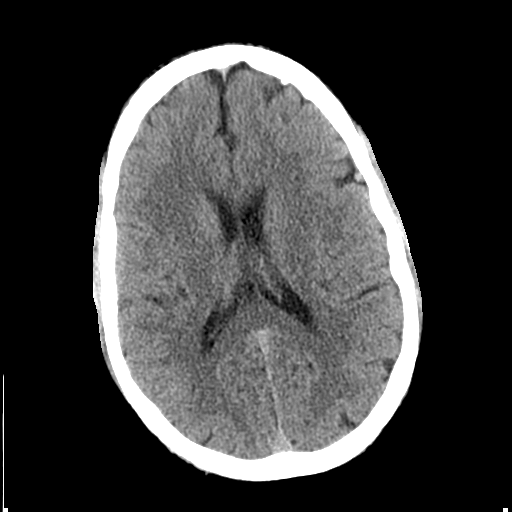
[im 20/35  brain]
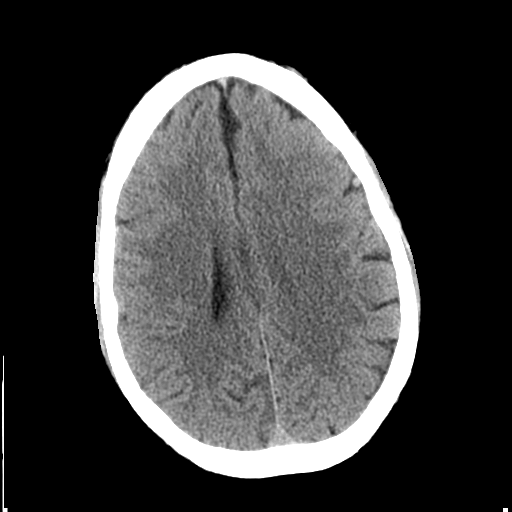
[im 22/35  brain]
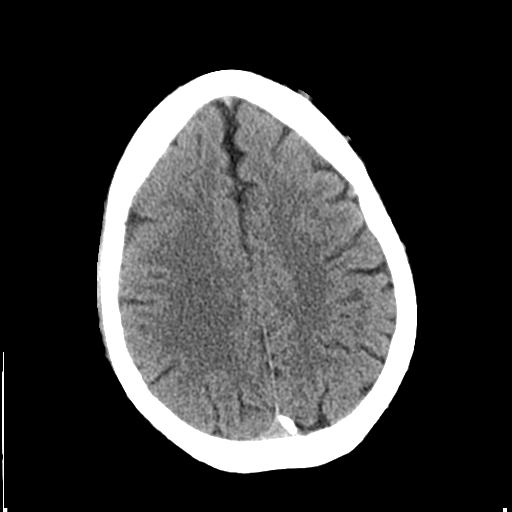
[im 22/35  bone]
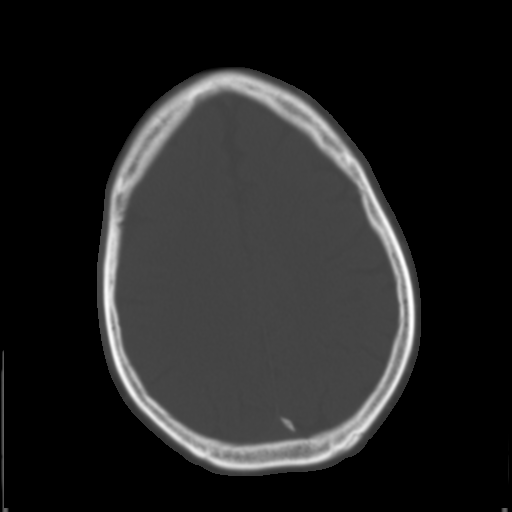
[im 25/35  brain]
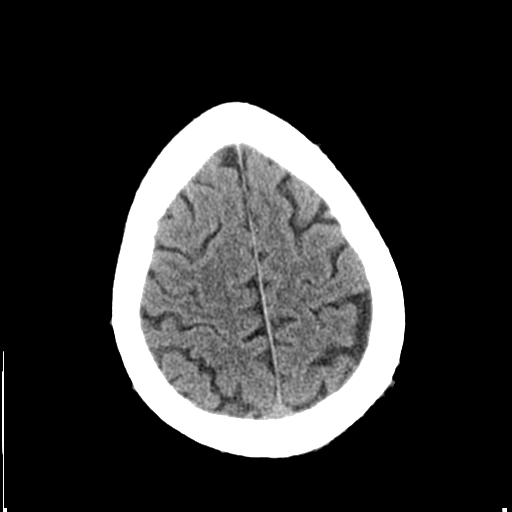
[im 27/35  brain]
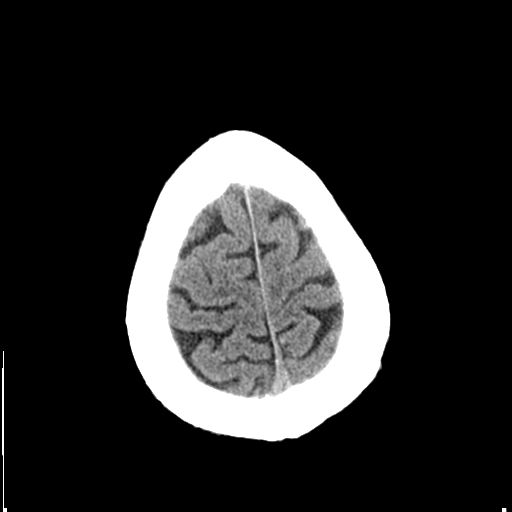
[im 30/35  brain]
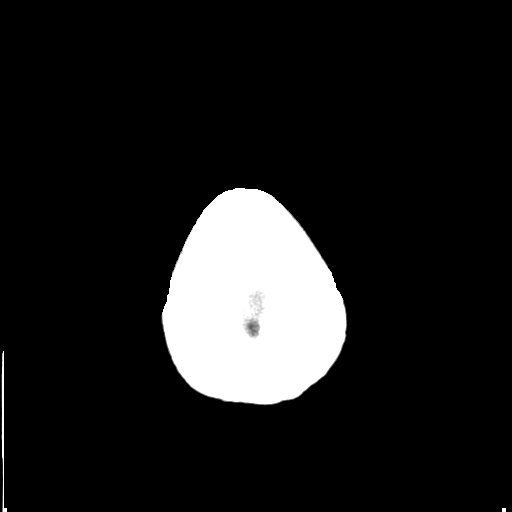
[im 32/35  brain]
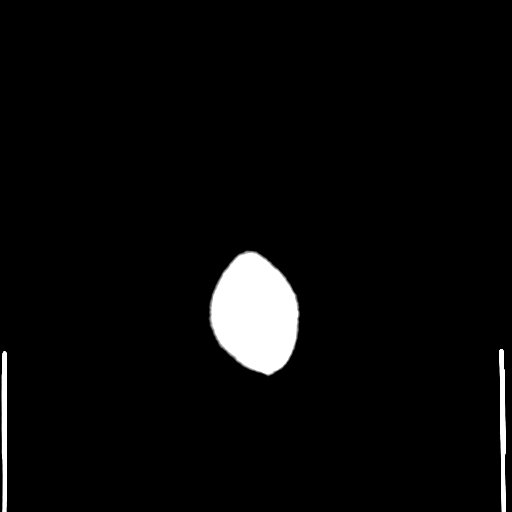
[im 32/35  bone]
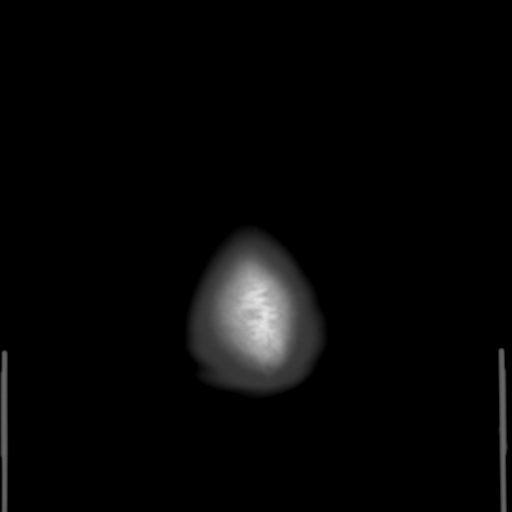

[Series 3: bone windows · axial · 0.45mm/px · z∈[+1494,+1544]mm · 3 of 35 slices shown]
[im 3/35  bone]
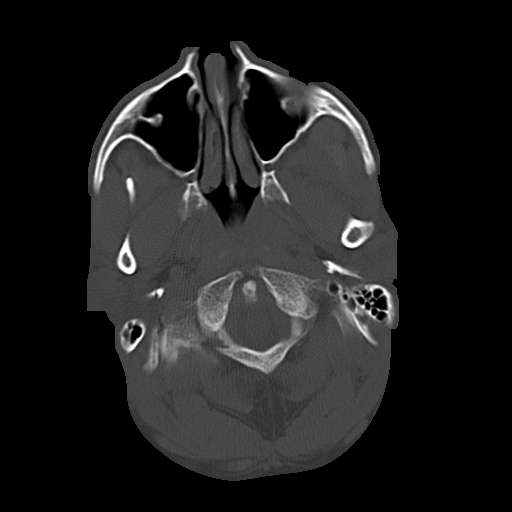
[im 8/35  bone]
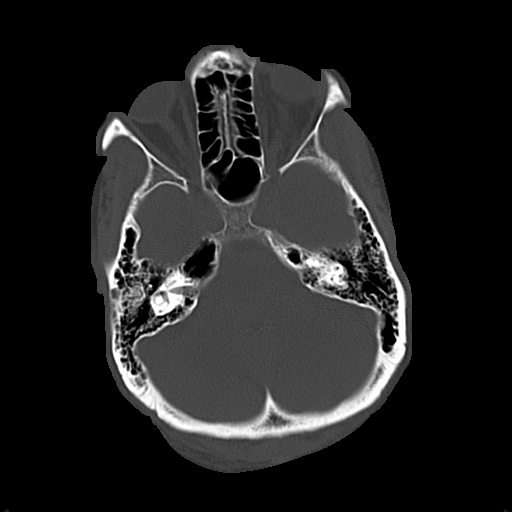
[im 13/35  bone]
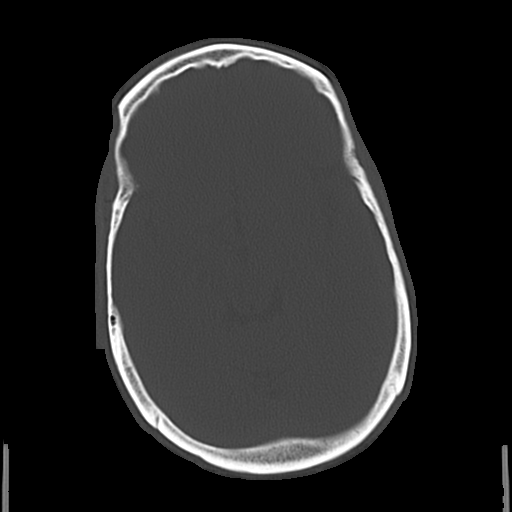

[16 of 30 positions shown; findings below may reference images not displayed]

FINDINGS: There is no intracranial hemorrhage, mass or evidence of acute
infarction. There is no extra-axial fluid collection. Gray matter
and white matter appear normal. Cerebral volume is normal for age.
Brainstem and posterior fossa are unremarkable. The CSF spaces
appear normal.

The bony structures are intact. The visible portions of the
paranasal sinuses are clear.
IMPRESSION: Normal brain

## 2017-07-10 IMAGING — CT CT CHEST W/ CM
2 of 3 series · 15 of 36 positions shown, 18 images · IV contrast (isovue)
Comparison: Chest x-ray 02/15/2016, 02/04/2016 chest CT 02/25/2009

CLINICAL DATA: 55-year-old male with a history of tuberculosis
undergoing treatment. Hypoxia.

EXAM:
CT CHEST WITH CONTRAST
TECHNIQUE: Multidetector CT imaging of the chest was performed during
intravenous contrast administration.
CONTRAST:  80 cc Isovue 370

[Series 2: chest with st · axial · 0.70mm/px · z∈[-227,-11]mm · 12 of 86 slices shown, 15 images]
[im 7/86  mediastinal]
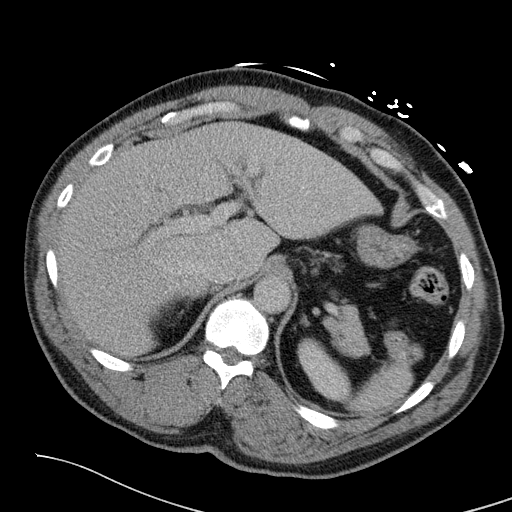
[im 7/86  lung]
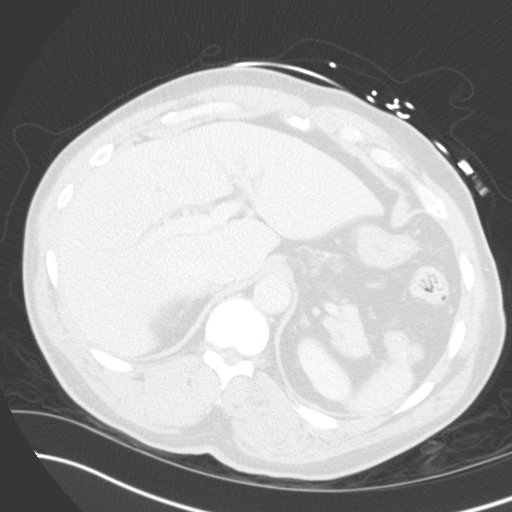
[im 13/86  lung]
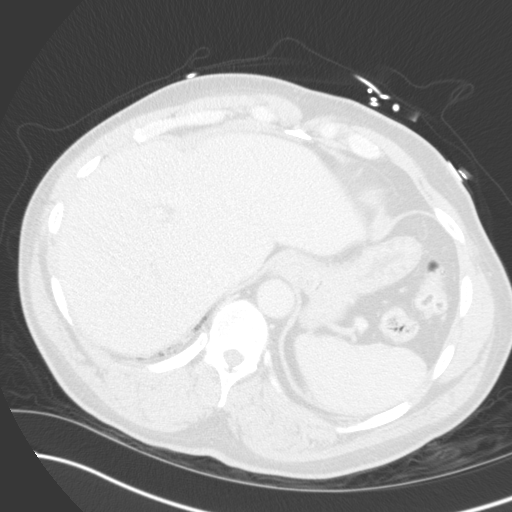
[im 19/86  lung]
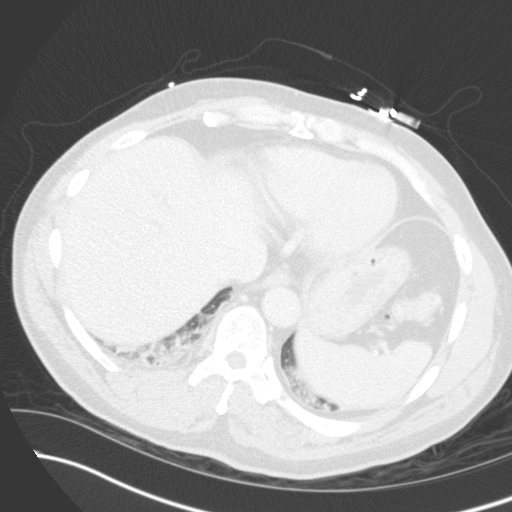
[im 26/86  lung]
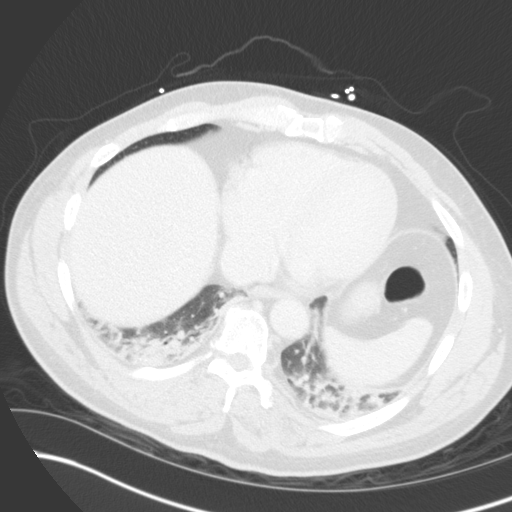
[im 32/86  mediastinal]
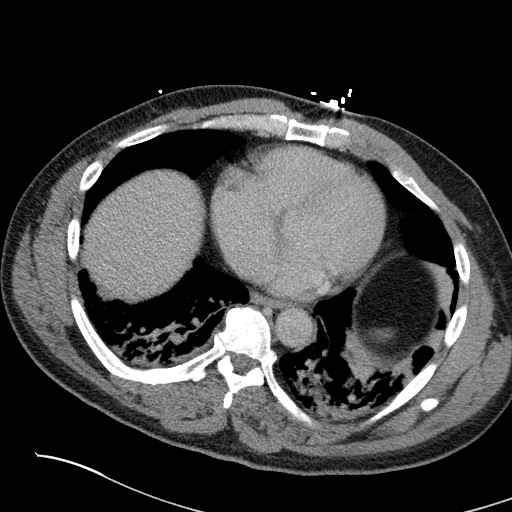
[im 32/86  lung]
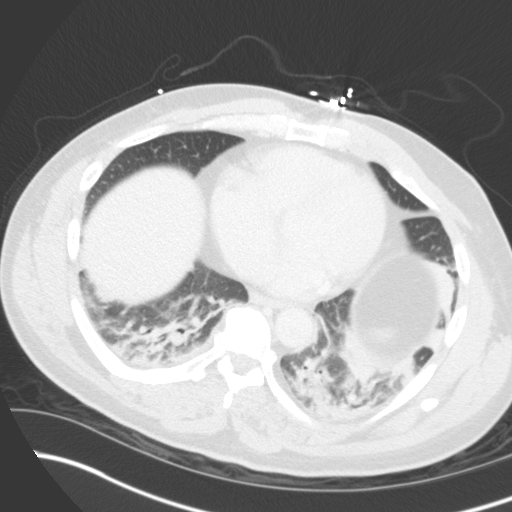
[im 38/86  lung]
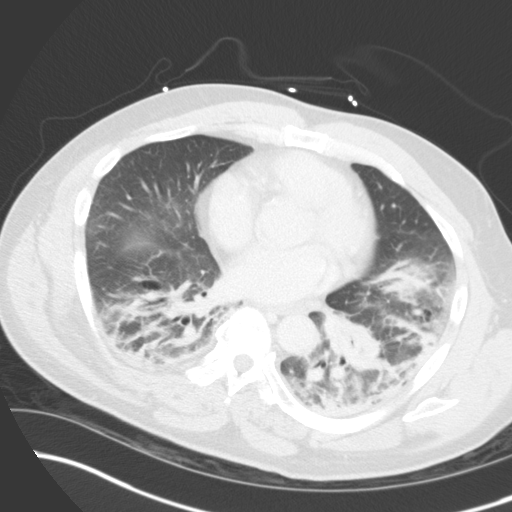
[im 48/86  lung]
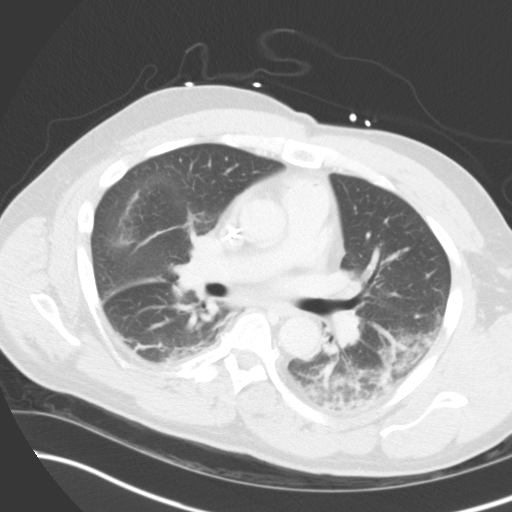
[im 54/86  lung]
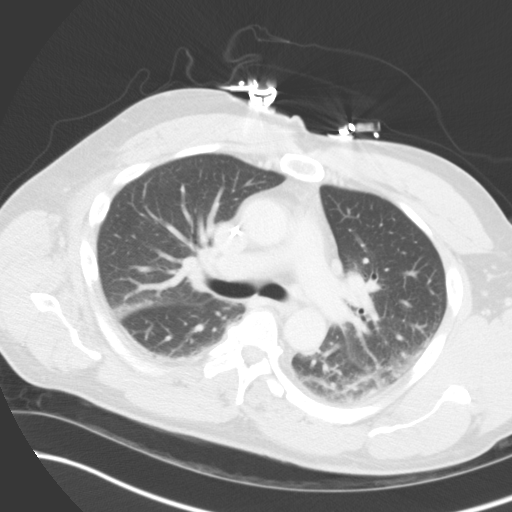
[im 60/86  mediastinal]
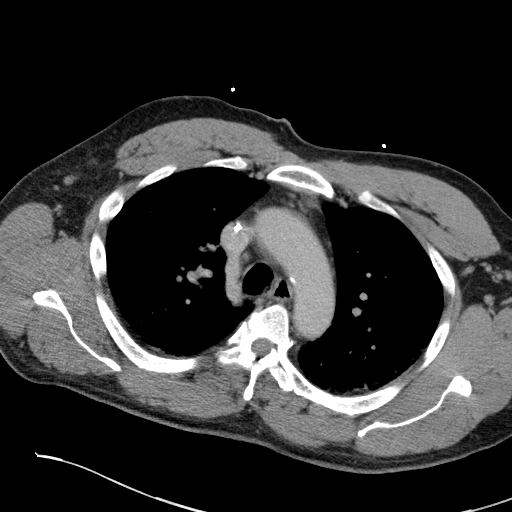
[im 60/86  lung]
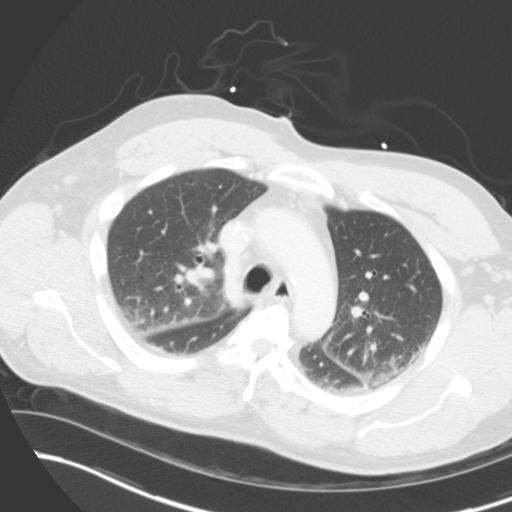
[im 67/86  lung]
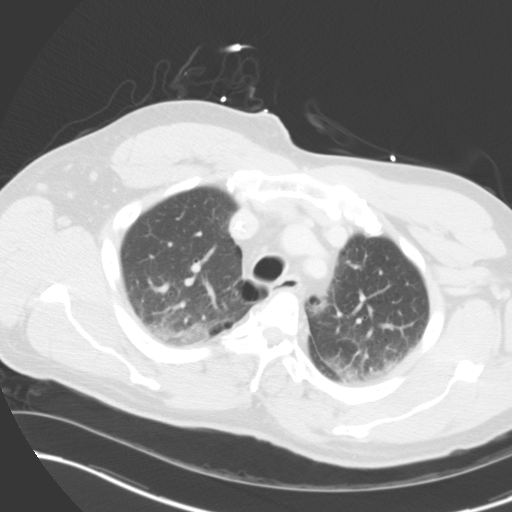
[im 73/86  lung]
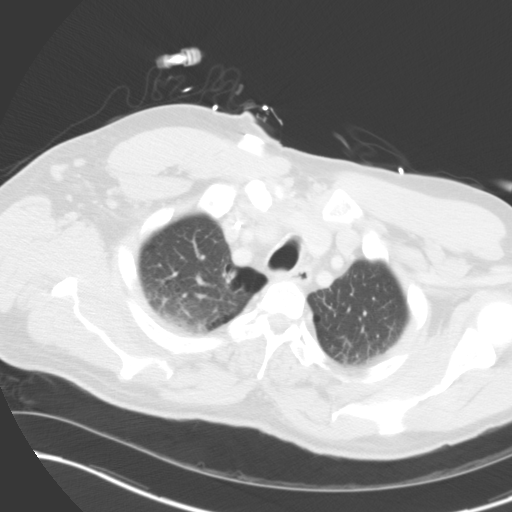
[im 79/86  lung]
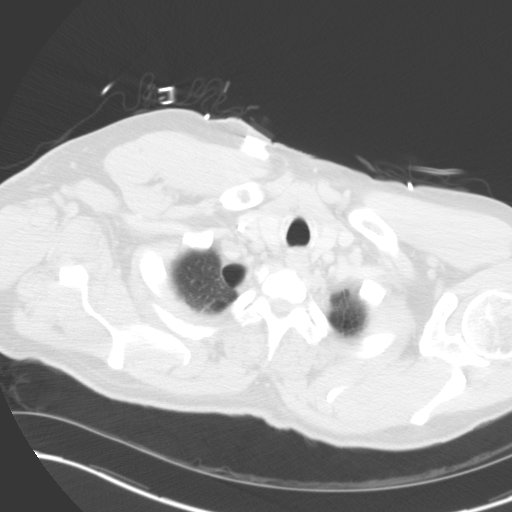

[Series 3: coronals · coronal · 0.51mm/px · 3 of 133 slices shown]
[im 27/133  lung]
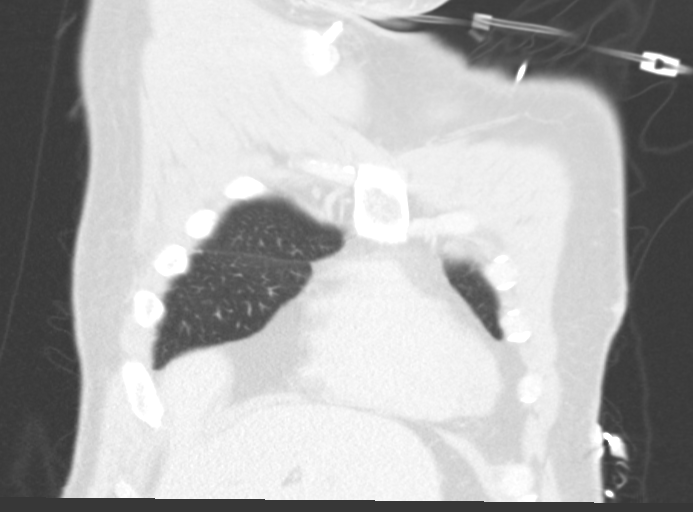
[im 53/133  lung]
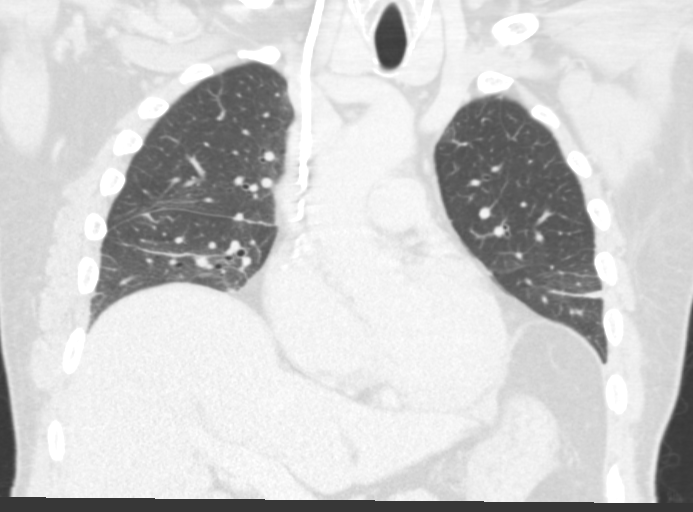
[im 80/133  lung]
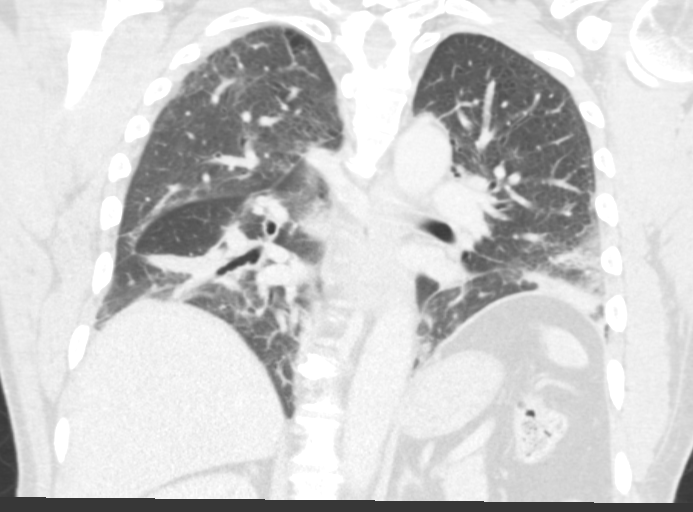

[15 of 36 positions shown; findings below may reference images not displayed]

FINDINGS: Chest:

Port catheter right chest wall.

Multiple bilateral axillary lymph nodes

Unremarkable appearance of the thoracic inlet, including the
visualized thyroid.

Mediastinal lymph nodes are present, none of which are enlarged.
Bilateral hilar lymph nodes.

Unremarkable appearance of the esophagus.

Although the study is contrast-enhanced, there is limited bolus
concentration within the central vascular structures, limiting
evaluation.

No aneurysm of the thoracic aorta. No periaortic fluid. No
significant atherosclerotic changes of the aorta or of the branch
vessels. Common origin of the innominate artery and left common
carotid artery.

Evaluation of the pulmonary arterial tree is limited.

Heart size within normal limits. No pericardial fluid/thickening. No
significant calcifications of the native coronary vasculature.

Paraseptal emphysema at the bilateral lung apices.

Nodular and linear opacities in the dependent lung bases involving
predominantly the right and left lower lobes. No endotracheal
debris. No significant bronchiectasis.

Wedge-shaped opacity in the lingula in a similar distribution to
comparison chest CT of 02/25/2009.

No pleural effusion.

Upper abdomen:

Surgical changes of cholecystectomy.

Musculoskeletal:

No displaced fracture.
IMPRESSION: Opacities in dependent lung bases, concerning for
pneumonia/pneumonitis given the history. The distribution is not
typical of tuberculous infection, however, atypical pathogens should
be included on the differential.

Multiple borderline enlarged lymph nodes of the axillary nodal
stations. This may reflect local reactive changes, or potentially
hematologic process. Correlation with physical exam and lab values
may be useful.

## 2017-07-15 NOTE — Progress Notes (Signed)
[facility]  Telephone:([phone] [phone]  ID: [name] OB: July 16, 1961  MR#: [id number]  WNU#:[id number]  Patient Care Team: Fleet Contras, MD as PCP - General (Internal Medicine) Christoper Allegra., MD as Referring Physician (Neurology)  CHIEF COMPLAINT:  Ocrevus infusions for MS  INTERVAL HISTORY: Patient returns to clinic today for further evaluation and initiation of Ocrevus. Currently, he feels well and is at his baseline.  He has no new neurologic complaints.  He denies any recent fevers.  He has a good appetite and denies weight loss.  He has no chest pain or shortness of breath.  He denies any nausea, vomiting, constipation, or diarrhea.  He has no urinary complaints.  Patient offers no specific complaints today.  REVIEW OF SYSTEMS:   Review of Systems  Constitutional: Negative.  Negative for fever, malaise/fatigue and weight loss.  Respiratory: Negative.  Negative for cough and shortness of breath.   Cardiovascular: Negative.  Negative for chest pain and leg swelling.  Gastrointestinal: Negative.  Negative for nausea and vomiting.  Genitourinary: Negative.   Musculoskeletal: Negative.   Skin: Negative.  Negative for rash.  Neurological: Positive for focal weakness. Negative for sensory change and weakness.  Psychiatric/Behavioral: Negative.  The patient is not nervous/anxious.     As per HPI. Otherwise, a complete review of systems is negative.  PAST MEDICAL HISTORY: Past Medical History:  Diagnosis Date  . Abdominal pain, unspecified site   . Anxiety   . Arthritis   . Benign paroxysmal positional vertigo   . Cellulitis and abscess of right leg 04/2017  . Chronic back pain   . Chronic pain syndrome 01/25/2008  . Cluster headache   . Depression    takes Zoloft daily  . DVT (deep venous thrombosis) (HCC)    in the left arm '09  . Gait abnormality    "uses mobile wheelchair, but is ambulatory"  . Gallstones 02/17/2009   resolved after gallbladder surgery.  [name] GERD (gastroesophageal reflux disease)    takes Omeprazole as needed  . Headache(784.0)    cluster headaches frequently-takes Topamax daily  . History of colonoscopy   . HTN (hypertension)    takes Lisinopril,Verapamil,and Triamterene HCTZ daily  . Insomnia 11/06/2008  . Joint pain   . Joint swelling    03-07-16 "swelling of right wrist" "after a fall-xray done 03-06-16 "no fractures".  . Memory loss    no an issue at present 03-07-16  . Multiple sclerosis (HCC)    Dx. 2005 - Dr. Tinnie Gens follows LOV 4'17 tx. Tysabri monthly IV-[facility] , [place].  [name] Nonspecific elevation of levels of transaminase or lactic acid dehydrogenase (LDH)   . Other specified visual disturbances   . Other syndromes affecting cervical region   . Pneumonia 2009  . Trigeminal neuralgia     history" Multiple sclerosis"    PAST SURGICAL HISTORY: Past Surgical History:  Procedure Laterality Date  . CHOLECYSTECTOMY  02/20/2009  . COLONOSCOPY WITH PROPOFOL N/A 03/17/2016   Procedure: COLONOSCOPY WITH PROPOFOL;  Surgeon: [name], MD;  Location: [facility] ENDOSCOPY;  Service: Endoscopy;  Laterality: N/A;  . PORT A CATH REVISION N/A 07/06/2015   Procedure: Removal and replacement of PORT A CATH;  Surgeon: [name], MD;  Location: [facility] OR;  Service: General;  Laterality: N/A;  . PORTACATH PLACEMENT N/A 03/27/2014   Procedure: INSERTION PORT-A-CATH;  Surgeon: [name], MD;  Location: [facility] OR;  Service: General;  Laterality: N/A;    FAMILY HISTORY Family History  Problem Relation Age of Onset  . Cancer Father   . Diabetes Mother        ADVANCED DIRECTIVES:    HEALTH MAINTENANCE: Social History  Substance Use Topics  . Smoking status: Former Smoker    Packs/day: 1.00    Years: 38.00    Types: Cigarettes  . Smokeless tobacco: Never Used     Comment: cutting back  . Alcohol use 0.0 oz/week     Comment: occasional     Colonoscopy:  PAP:  Bone  density:  Lipid panel:  No Known Allergies  Current Outpatient Prescriptions  Medication Sig Dispense Refill  . baclofen (LIORESAL) 20 MG tablet Take 20 mg by mouth 4 (four) times daily.     [name] dalfampridine 10 MG TB12 Take 1 tablet (10 mg total) by mouth 2 (two) times daily. 60 tablet 11  . Eszopiclone (ESZOPICLONE) 3 MG TABS Take 3 mg by mouth at bedtime as needed (for sleep).     . gabapentin (NEURONTIN) 600 MG tablet Take 600 mg by mouth 4 (four) times daily.     [name] levETIRAcetam (KEPPRA) 500 MG tablet Take 500 mg by mouth 2 (two) times daily.  5  . Natalizumab (TYSABRI IV) Inject into the vein every 30 (thirty) days.    [name] omeprazole (PRILOSEC) 40 MG capsule Take 40 mg by mouth daily.  4  . Oxcarbazepine (TRILEPTAL) 300 MG tablet Take 300 mg by mouth 3 (three) times daily.    [name] oxyCODONE-acetaminophen (PERCOCET) 10-325 MG tablet Take 1 tablet by mouth every 4 (four) hours as needed for pain. 5 tablet 0  . potassium chloride SA (K-DUR,KLOR-CON) 20 MEQ tablet Take 1 tablet (20 mEq total) by mouth daily. 30 tablet 0  . sertraline (ZOLOFT) 50 MG tablet Take 50 mg by mouth daily.     [name] topiramate (TOPAMAX) 25 MG tablet Take 1 tablet (25 mg total) by mouth 2 (two) times daily. 30 tablet 0  . verapamil (CALAN-SR) 120 MG CR tablet Take 1 tablet (120 mg total) by mouth at bedtime. 30 tablet 0  . XARELTO 10 MG TABS tablet Take 10 mg by mouth daily.  2  . saccharomyces boulardii (FLORASTOR) 250 MG capsule Take 1 capsule (250 mg total) by mouth 2 (two) times daily. (Patient not taking: Reported on 07/17/2017) 30 capsule 0   No current facility-administered medications for this visit.    Facility-Administered Medications Ordered in Other Visits  Medication Dose Route Frequency Provider Last Rate Last Dose  . ocrelizumab (OCREVUS) 300 mg in sodium chloride 0.9 % 250 mL  300 mg Intravenous Once Jeralyn Ruths, MD 30 mL/hr at 07/17/17 1028 300 mg at 07/17/17 1028  . sodium chloride 0.9 % injection  10 mL  10 mL Intracatheter PRN Loann Quill, NP   10 mL at 11/07/15 1012    OBJECTIVE: Vitals:   07/17/17 0945  BP: 116/76  Pulse: 64  Resp: 18  Temp: 97.6 F (36.4 C)     There is no height or weight on file to calculate BMI.    ECOG FS:1 - Symptomatic but completely ambulatory  General: Well-developed, well-nourished, no acute distress.  Eyes: anicteric sclera. Lungs: Clear to auscultation bilaterally. Heart: Regular rate and rhythm. No rubs, murmurs, or gallops. Abdomen: Soft, nontender, nondistended. No organomegaly noted, normoactive bowel sounds. Musculoskeletal: No edema, cyanosis, or clubbing. Neuro: Alert, answering all questions appropriately. Cranial nerves grossly intact. Skin: No rashes or petechiae noted. Psych: Normal affect.   LAB RESULTS:  Lab Results  Component Value Date   NA 141 06/04/2017   K 3.2 (L) 06/04/2017   CL 105 06/04/2017   CO2 24 06/04/2017   GLUCOSE 96 06/04/2017   BUN 9 06/04/2017   CREATININE 1.02 06/04/2017   CALCIUM 9.4 06/04/2017   PROT 6.9 06/04/2017   ALBUMIN 4.2 06/04/2017   AST 16 06/04/2017   ALT 19 06/04/2017   ALKPHOS 85 06/04/2017   BILITOT 0.4 06/04/2017   GFRNONAA 82 06/04/2017   GFRAA >89 06/04/2017    Lab Results  Component Value Date   WBC 5.9 05/11/2017   NEUTROABS 8.1 (H) 05/06/2017   HGB 11.3 (L) 05/11/2017   HCT 35.4 (L) 05/11/2017   MCV 81.2 05/11/2017   PLT 273 05/11/2017     STUDIES: No results found.  ASSESSMENT: Ocrevus infusion for multiple sclerosis.  PLAN:    1.  Ocrevus infusion for multiple sclerosis: Tysabri was reportedly discontinued while undergoing treatment for hepatitis C.  Proceed with 300 mg Ocrevus today. Patient will return to clinic in 2 weeks for a second infusion. At that point, he can be switched to 600 mg every 6 months. Patient understands that all of his laboratory work, including JC virus, will be monitored by his primary neurologist. He also understands that if he  has any questions regarding Ocrevus or his multiple sclerosis but they should be directed towards Dr. Tinnie Gens. Return to clinic in 2 weeks for his next infusion and then 6 months for further evaluation and continuation of treatment.  Approximately 30 minutes was spent in discussion of which greater than 50% was consultation.  Patient expressed understanding and was in agreement with this plan. He also understands that He can call clinic at any time with any questions, concerns, or complaints.    Jeralyn Ruths, MD   07/17/2017 11:17 AM

## 2017-07-17 ENCOUNTER — Ambulatory Visit: Payer: Self-pay

## 2017-07-17 ENCOUNTER — Inpatient Hospital Stay: Payer: 59

## 2017-07-17 ENCOUNTER — Inpatient Hospital Stay: Payer: 59 | Attending: Oncology | Admitting: Oncology

## 2017-07-17 ENCOUNTER — Other Ambulatory Visit: Payer: Self-pay | Admitting: Oncology

## 2017-07-17 VITALS — BP 120/76 | HR 60 | Temp 97.3°F | Resp 18

## 2017-07-17 VITALS — BP 116/76 | HR 64 | Temp 97.6°F | Resp 18

## 2017-07-17 DIAGNOSIS — G35 Multiple sclerosis: Secondary | ICD-10-CM

## 2017-07-17 DIAGNOSIS — B1920 Unspecified viral hepatitis C without hepatic coma: Secondary | ICD-10-CM | POA: Insufficient documentation

## 2017-07-17 DIAGNOSIS — H8110 Benign paroxysmal vertigo, unspecified ear: Secondary | ICD-10-CM | POA: Insufficient documentation

## 2017-07-17 DIAGNOSIS — G4700 Insomnia, unspecified: Secondary | ICD-10-CM | POA: Diagnosis not present

## 2017-07-17 DIAGNOSIS — Z87891 Personal history of nicotine dependence: Secondary | ICD-10-CM | POA: Insufficient documentation

## 2017-07-17 DIAGNOSIS — K219 Gastro-esophageal reflux disease without esophagitis: Secondary | ICD-10-CM | POA: Diagnosis not present

## 2017-07-17 DIAGNOSIS — I10 Essential (primary) hypertension: Secondary | ICD-10-CM | POA: Diagnosis not present

## 2017-07-17 DIAGNOSIS — F419 Anxiety disorder, unspecified: Secondary | ICD-10-CM | POA: Diagnosis not present

## 2017-07-17 DIAGNOSIS — F329 Major depressive disorder, single episode, unspecified: Secondary | ICD-10-CM | POA: Diagnosis not present

## 2017-07-17 DIAGNOSIS — Z79899 Other long term (current) drug therapy: Secondary | ICD-10-CM | POA: Insufficient documentation

## 2017-07-17 MED ORDER — SODIUM CHLORIDE 0.9% FLUSH
10.0000 mL | INTRAVENOUS | Status: DC | PRN
  Filled 2017-07-17: qty 10

## 2017-07-17 MED ORDER — ACETAMINOPHEN 500 MG PO TABS
1000.0000 mg | ORAL_TABLET | Freq: Once | ORAL | Status: AC
  Administered 2017-07-17: 1000 mg via ORAL

## 2017-07-17 MED ORDER — DIPHENHYDRAMINE HCL 50 MG/ML IJ SOLN: 25.0000 mg | Freq: Once | INTRAMUSCULAR | Status: DC

## 2017-07-17 MED ORDER — DIPHENHYDRAMINE HCL 25 MG PO CAPS
25.0000 mg | ORAL_CAPSULE | Freq: Once | ORAL | Status: AC
  Administered 2017-07-17: 25 mg via ORAL

## 2017-07-17 MED ORDER — SODIUM CHLORIDE 0.9 % IV SOLN
Freq: Once | INTRAVENOUS | Status: AC
  Administered 2017-07-17: 10:00:00 via INTRAVENOUS
  Filled 2017-07-17: qty 1000

## 2017-07-17 MED ORDER — METHYLPREDNISOLONE SODIUM SUCC 125 MG IJ SOLR
125.0000 mg | Freq: Once | INTRAMUSCULAR | Status: AC
  Administered 2017-07-17: 125 mg via INTRAVENOUS

## 2017-07-17 MED ORDER — HEPARIN SOD (PORK) LOCK FLUSH 100 UNIT/ML IV SOLN
500.0000 [IU] | Freq: Once | INTRAVENOUS | Status: AC
  Administered 2017-07-17: 500 [IU] via INTRAVENOUS
  Filled 2017-07-17: qty 5

## 2017-07-17 MED ORDER — SODIUM CHLORIDE 0.9 % IV SOLN
300.0000 mg | Freq: Once | INTRAVENOUS | Status: AC
  Administered 2017-07-17: 300 mg via INTRAVENOUS
  Filled 2017-07-17: qty 10

## 2017-07-17 NOTE — Progress Notes (Signed)
Patient is here for first ocrevus infusion as directed by his neurologist.

## 2017-07-21 ENCOUNTER — Ambulatory Visit (INDEPENDENT_AMBULATORY_CARE_PROVIDER_SITE_OTHER): Payer: Medicare Other | Admitting: Internal Medicine

## 2017-07-21 ENCOUNTER — Encounter: Payer: Self-pay | Admitting: Internal Medicine

## 2017-07-21 VITALS — BP 122/85 | HR 75 | Temp 98.3°F | Wt 182.0 lb

## 2017-07-21 DIAGNOSIS — B182 Chronic viral hepatitis C: Secondary | ICD-10-CM | POA: Diagnosis not present

## 2017-07-21 DIAGNOSIS — G35 Multiple sclerosis: Secondary | ICD-10-CM

## 2017-07-21 NOTE — Progress Notes (Signed)
RFV: follow up for hep C  Patient ID: [name], male   DOB: [birth year], 56 y.o.   MRN: [id number]  HPI [name] has now finished with his 12 wks re-treatment of his hep C with Vosevi  At end of July 2018 (after failing 12 wks of Epclusa). He was last seen by [facility] in clinic at [facility] at end of July where his hep C VL was undetectable.[name] He is very anxious about whether we can push up the cure labs. Explained to him that we can not confirm cure until we can at least get the SVR12 (at minimum) and the [facility]. He understands it better now. Hopefully, he'll be cured this time. Dr. Leotis Shames has recently started the patient on anti cd20- b cell therapy for his MS treatment.  He is not immune to hep A. There is no contraindication with the vaccine in MS patients and received the first dose of hep A at end of July. The patient has only had 1 dose of his medication and feels some improvement. He is ambulating slowly on crutches. He gets his 2nd dose next week  Outpatient Encounter Prescriptions as of 07/21/2017  Medication Sig  . baclofen (LIORESAL) 20 MG tablet Take 20 mg by mouth 4 (four) times daily.   [name] dalfampridine 10 MG TB12 Take 1 tablet (10 mg total) by mouth 2 (two) times daily.  . Eszopiclone (ESZOPICLONE) 3 MG TABS Take 3 mg by mouth at bedtime as needed (for sleep).   . gabapentin (NEURONTIN) 600 MG tablet Take 600 mg by mouth 4 (four) times daily.   [name] levETIRAcetam (KEPPRA) 500 MG tablet Take 500 mg by mouth 2 (two) times daily.  [name] omeprazole (PRILOSEC) 40 MG capsule Take 40 mg by mouth daily.  . Oxcarbazepine (TRILEPTAL) 300 MG tablet Take 300 mg by mouth 3 (three) times daily.  [name] oxyCODONE-acetaminophen (PERCOCET) 10-325 MG tablet Take 1 tablet by mouth every 4 (four) hours as needed for pain.  . potassium chloride SA (K-DUR,KLOR-CON) 20 MEQ tablet Take 1 tablet (20 mEq total) by mouth daily.  . sertraline (ZOLOFT) 50 MG tablet Take 50 mg by mouth daily.   [name] topiramate (TOPAMAX) 25 MG tablet  Take 1 tablet (25 mg total) by mouth 2 (two) times daily.  . verapamil (CALAN-SR) 120 MG CR tablet Take 1 tablet (120 mg total) by mouth at bedtime.  [name] 10 MG TABS tablet Take 10 mg by mouth daily.  . Natalizumab (TYSABRI IV) Inject into the vein every 30 (thirty) days.  [name] saccharomyces boulardii (FLORASTOR) 250 MG capsule Take 1 capsule (250 mg total) by mouth 2 (two) times daily. (Patient not taking: Reported on 07/17/2017)   Facility-Administered Encounter Medications as of 07/21/2017  Medication  . sodium chloride 0.9 % injection 10 mL     Patient Active Problem List   Diagnosis Date Noted  . Cellulitis of right leg 05/07/2017  . Right carpal tunnel syndrome 02/16/2017  . Gait disturbance 01/22/2017  . Insomnia 01/22/2017  . Depression with anxiety 01/22/2017  . High risk medication use 01/22/2017  . Major depressive disorder, recurrent episode (HCC) 12/19/2016  . Suicidal ideation   . AKI (acute kidney injury) (HCC) 11/15/2016  . Chronic hepatitis C virus infection (HCC) 11/15/2016  . Chronic abdominal pain 11/15/2016  . Chronic pain syndrome 11/15/2016  . Acute retention of urine 11/15/2016  . Generalized abdominal pain   . Chronic cluster headache, not intractable   . Community acquired pneumonia   .  TB lung, latent   . HCAP (healthcare-associated pneumonia) 02/16/2016  . Hypokalemia 02/16/2016  . Intractable cluster headache syndrome   . DVT (deep venous thrombosis) (HCC)   . Depression   . GERD (gastroesophageal reflux disease)   . HTN (hypertension)   . Essential hypertension   . Gastroesophageal reflux disease without esophagitis   . CAP (community acquired pneumonia)   . Multiple sclerosis (HCC) 08/02/2013  . ABDOMINAL BLOATING 10/14/2010  . LOOSE STOOLS 10/14/2010  . PULMONARY EMBOLISM, HX OF 10/14/2010     Health Maintenance Due  Topic Date Due  . TETANUS/TDAP  11/12/1979  . INFLUENZA VACCINE  06/10/2017     Review of Systems + arthritis,  otherwise 12 point ros is negative Physical Exam   BP 122/85   Pulse 75   Temp 98.3 F (36.8 C) (Oral)   Wt 182 lb (82.6 kg)   BMI 26.88 kg/m   Physical Exam  Constitutional: He is oriented to person, place, and time. He appears well-developed and well-nourished. No distress.  HENT:  Mouth/Throat: Oropharynx is clear and moist. No oropharyngeal exudate.  Cardiovascular: Normal rate, regular rhythm and normal heart sounds. Exam reveals no gallop and no friction rub.  No murmur heard.  Pulmonary/Chest: Effort normal and breath sounds normal. No respiratory distress. He has no wheezes.  Lymphadenopathy:  He has no cervical adenopathy.  Neurological: He is alert and oriented to person, place, and time.  Skin: Skin is warm and dry. No rash noted. No erythema.  Psychiatric: He has a normal mood and affect. His behavior is normal.    CBC Lab Results  Component Value Date   WBC 5.9 05/11/2017   RBC 4.36 05/11/2017   HGB 11.3 (L) 05/11/2017   HCT 35.4 (L) 05/11/2017   PLT 273 05/11/2017   MCV 81.2 05/11/2017   MCH 25.9 (L) 05/11/2017   MCHC 31.9 05/11/2017   RDW 15.9 (H) 05/11/2017   LYMPHSABS 4.3 (H) 05/06/2017   MONOABS 1.8 (H) 05/06/2017   EOSABS 0.0 05/06/2017    BMET Lab Results  Component Value Date   NA 141 06/04/2017   K 3.2 (L) 06/04/2017   CL 105 06/04/2017   CO2 24 06/04/2017   GLUCOSE 96 06/04/2017   BUN 9 06/04/2017   CREATININE 1.02 06/04/2017   CALCIUM 9.4 06/04/2017   GFRNONAA 82 06/04/2017   GFRAA >89 06/04/2017      Assessment and Plan  Chronic hepatitis c = will check hep C viral load. But also wil ldo it again in 6 wk which would be the SVR 12 timeframe.  MS = appears to have excellent response to his immunomodulatory medication. He has next dose in 1 wk. Can likely get flu shot roughly 4-6 wk thereafter

## 2017-07-23 LAB — COMPLETE METABOLIC PANEL WITH GFR
AG Ratio: 1.3 (calc) (ref 1.0–2.5)
ALKALINE PHOSPHATASE (APISO): 95 U/L (ref 40–115)
ALT: 27 U/L (ref 9–46)
AST: 23 U/L (ref 10–35)
Albumin: 4.3 g/dL (ref 3.6–5.1)
BILIRUBIN TOTAL: 0.4 mg/dL (ref 0.2–1.2)
BUN: 12 mg/dL (ref 7–25)
CO2: 26 mmol/L (ref 20–32)
CREATININE: 1.05 mg/dL (ref 0.70–1.33)
Calcium: 9.6 mg/dL (ref 8.6–10.3)
Chloride: 103 mmol/L (ref 98–110)
GFR, EST AFRICAN AMERICAN: 92 mL/min/{1.73_m2} (ref >=60)
GFR, Est Non African American: 79 mL/min/{1.73_m2} (ref >=60)
GLOBULIN: 3.3 g/dL (ref 1.9–3.7)
GLUCOSE: 120 mg/dL — AB (ref 65–99)
Potassium: 3 mmol/L — ABNORMAL LOW (ref 3.5–5.3)
SODIUM: 139 mmol/L (ref 135–146)
Total Protein: 7.6 g/dL (ref 6.1–8.1)

## 2017-07-23 LAB — HEPATITIS C RNA QUANTITATIVE
HCV Quantitative Log: <1.18 Log IU/mL
HCV RNA, PCR, QN: <15 IU/mL

## 2017-07-27 ENCOUNTER — Ambulatory Visit: Payer: 59 | Admitting: Neurology

## 2017-07-27 ENCOUNTER — Encounter: Payer: Self-pay | Admitting: Internal Medicine

## 2017-07-28 ENCOUNTER — Encounter: Payer: Self-pay | Admitting: Neurology

## 2017-07-31 ENCOUNTER — Inpatient Hospital Stay: Payer: 59

## 2017-07-31 VITALS — BP 104/65 | HR 57 | Temp 96.6°F | Resp 18

## 2017-07-31 DIAGNOSIS — G35 Multiple sclerosis: Secondary | ICD-10-CM

## 2017-07-31 MED ORDER — SODIUM CHLORIDE 0.9% FLUSH
10.0000 mL | INTRAVENOUS | Status: DC | PRN
  Administered 2017-07-31: 10 mL via INTRAVENOUS
  Filled 2017-07-31: qty 10

## 2017-07-31 MED ORDER — DIPHENHYDRAMINE HCL 50 MG/ML IJ SOLN
INTRAMUSCULAR | Status: AC
  Filled 2017-07-31: qty 1

## 2017-07-31 MED ORDER — ACETAMINOPHEN 500 MG PO TABS
1000.0000 mg | ORAL_TABLET | Freq: Once | ORAL | Status: AC
  Administered 2017-07-31: 1000 mg via ORAL

## 2017-07-31 MED ORDER — DIPHENHYDRAMINE HCL 50 MG/ML IJ SOLN
25.0000 mg | Freq: Once | INTRAMUSCULAR | Status: AC
  Administered 2017-07-31: 25 mg via INTRAVENOUS

## 2017-07-31 MED ORDER — HEPARIN SOD (PORK) LOCK FLUSH 100 UNIT/ML IV SOLN
500.0000 [IU] | Freq: Once | INTRAVENOUS | Status: AC
  Administered 2017-07-31: 500 [IU] via INTRAVENOUS

## 2017-07-31 MED ORDER — ACETAMINOPHEN 500 MG PO TABS
ORAL_TABLET | ORAL | Status: AC
  Filled 2017-07-31: qty 2

## 2017-07-31 MED ORDER — METHYLPREDNISOLONE SODIUM SUCC 125 MG IJ SOLR
INTRAMUSCULAR | Status: AC
  Filled 2017-07-31: qty 2

## 2017-07-31 MED ORDER — METHYLPREDNISOLONE SODIUM SUCC 125 MG IJ SOLR
125.0000 mg | Freq: Once | INTRAMUSCULAR | Status: AC
  Administered 2017-07-31: 125 mg via INTRAVENOUS

## 2017-07-31 MED ORDER — SODIUM CHLORIDE 0.9 % IV SOLN
300.0000 mg | Freq: Once | INTRAVENOUS | Status: AC
  Administered 2017-07-31: 300 mg via INTRAVENOUS
  Filled 2017-07-31: qty 10

## 2017-08-04 ENCOUNTER — Encounter (HOSPITAL_COMMUNITY): Payer: Self-pay | Admitting: Emergency Medicine

## 2017-08-04 ENCOUNTER — Inpatient Hospital Stay (HOSPITAL_COMMUNITY)
Admission: EM | Admit: 2017-08-04 | Discharge: 2017-08-09 | DRG: 060 | Disposition: A | Discharge status: Home / Self Care | Payer: Medicare Other | Attending: Internal Medicine | Admitting: Internal Medicine

## 2017-08-04 ENCOUNTER — Observation Stay (HOSPITAL_COMMUNITY): Payer: Medicare Other

## 2017-08-04 DIAGNOSIS — G894 Chronic pain syndrome: Secondary | ICD-10-CM | POA: Diagnosis present

## 2017-08-04 DIAGNOSIS — F339 Major depressive disorder, recurrent, unspecified: Secondary | ICD-10-CM | POA: Diagnosis present

## 2017-08-04 DIAGNOSIS — Z87891 Personal history of nicotine dependence: Secondary | ICD-10-CM

## 2017-08-04 DIAGNOSIS — Z7901 Long term (current) use of anticoagulants: Secondary | ICD-10-CM

## 2017-08-04 DIAGNOSIS — Z79899 Other long term (current) drug therapy: Secondary | ICD-10-CM

## 2017-08-04 DIAGNOSIS — B182 Chronic viral hepatitis C: Secondary | ICD-10-CM | POA: Diagnosis present

## 2017-08-04 DIAGNOSIS — K219 Gastro-esophageal reflux disease without esophagitis: Secondary | ICD-10-CM | POA: Diagnosis present

## 2017-08-04 DIAGNOSIS — G43109 Migraine with aura, not intractable, without status migrainosus: Secondary | ICD-10-CM | POA: Diagnosis present

## 2017-08-04 DIAGNOSIS — I10 Essential (primary) hypertension: Secondary | ICD-10-CM | POA: Diagnosis present

## 2017-08-04 DIAGNOSIS — R531 Weakness: Secondary | ICD-10-CM

## 2017-08-04 DIAGNOSIS — Z86718 Personal history of other venous thrombosis and embolism: Secondary | ICD-10-CM

## 2017-08-04 DIAGNOSIS — I82409 Acute embolism and thrombosis of unspecified deep veins of unspecified lower extremity: Secondary | ICD-10-CM | POA: Diagnosis present

## 2017-08-04 DIAGNOSIS — E860 Dehydration: Secondary | ICD-10-CM | POA: Diagnosis present

## 2017-08-04 DIAGNOSIS — E876 Hypokalemia: Secondary | ICD-10-CM | POA: Diagnosis present

## 2017-08-04 DIAGNOSIS — G35 Multiple sclerosis: Secondary | ICD-10-CM | POA: Diagnosis not present

## 2017-08-04 DIAGNOSIS — F329 Major depressive disorder, single episode, unspecified: Secondary | ICD-10-CM | POA: Diagnosis present

## 2017-08-04 DIAGNOSIS — M1990 Unspecified osteoarthritis, unspecified site: Secondary | ICD-10-CM | POA: Diagnosis present

## 2017-08-04 DIAGNOSIS — Z993 Dependence on wheelchair: Secondary | ICD-10-CM

## 2017-08-04 DIAGNOSIS — F331 Major depressive disorder, recurrent, moderate: Secondary | ICD-10-CM | POA: Diagnosis not present

## 2017-08-04 DIAGNOSIS — T380X5A Adverse effect of glucocorticoids and synthetic analogues, initial encounter: Secondary | ICD-10-CM | POA: Diagnosis not present

## 2017-08-04 DIAGNOSIS — R4781 Slurred speech: Secondary | ICD-10-CM | POA: Diagnosis present

## 2017-08-04 DIAGNOSIS — I82499 Acute embolism and thrombosis of other specified deep vein of unspecified lower extremity: Secondary | ICD-10-CM

## 2017-08-04 DIAGNOSIS — Z79891 Long term (current) use of opiate analgesic: Secondary | ICD-10-CM

## 2017-08-04 DIAGNOSIS — D72829 Elevated white blood cell count, unspecified: Secondary | ICD-10-CM | POA: Diagnosis not present

## 2017-08-04 DIAGNOSIS — G44029 Chronic cluster headache, not intractable: Secondary | ICD-10-CM | POA: Diagnosis present

## 2017-08-04 DIAGNOSIS — R1310 Dysphagia, unspecified: Secondary | ICD-10-CM | POA: Diagnosis present

## 2017-08-04 DIAGNOSIS — F419 Anxiety disorder, unspecified: Secondary | ICD-10-CM | POA: Diagnosis present

## 2017-08-04 LAB — CBC WITH DIFFERENTIAL/PLATELET
Basophils Absolute: 0 10*3/uL (ref 0.0–0.1)
Basophils Relative: 0 %
EOS PCT: 1 %
Eosinophils Absolute: 0.1 10*3/uL (ref 0.0–0.7)
HCT: 42 % (ref 39.0–52.0)
Hemoglobin: 13.8 g/dL (ref 13.0–17.0)
LYMPHS ABS: 1.4 10*3/uL (ref 0.7–4.0)
LYMPHS PCT: 33 %
MCH: 26.4 pg (ref 26.0–34.0)
MCHC: 32.9 g/dL (ref 30.0–36.0)
MCV: 80.3 fL (ref 78.0–100.0)
Monocytes Absolute: 0.3 10*3/uL (ref 0.1–1.0)
Monocytes Relative: 7 %
Neutro Abs: 2.6 10*3/uL (ref 1.7–7.7)
Neutrophils Relative %: 59 %
PLATELETS: 253 10*3/uL (ref 150–400)
RBC: 5.23 MIL/uL (ref 4.22–5.81)
RDW: 16.2 % — ABNORMAL HIGH (ref 11.5–15.5)
WBC: 4.4 10*3/uL (ref 4.0–10.5)

## 2017-08-04 LAB — COMPREHENSIVE METABOLIC PANEL
ALK PHOS: 81 U/L (ref 38–126)
ALT: 17 U/L (ref 17–63)
AST: 15 U/L (ref 15–41)
Albumin: 3.6 g/dL (ref 3.5–5.0)
Anion gap: 7 (ref 5–15)
BILIRUBIN TOTAL: 0.7 mg/dL (ref 0.3–1.2)
BUN: 6 mg/dL (ref 6–20)
CALCIUM: 8.7 mg/dL — AB (ref 8.9–10.3)
CO2: 24 mmol/L (ref 22–32)
CREATININE: 0.81 mg/dL (ref 0.61–1.24)
Chloride: 109 mmol/L (ref 101–111)
Glucose, Bld: 114 mg/dL — ABNORMAL HIGH (ref 65–99)
Potassium: 2.7 mmol/L — CL (ref 3.5–5.1)
Sodium: 140 mmol/L (ref 135–145)
TOTAL PROTEIN: 6.7 g/dL (ref 6.5–8.1)

## 2017-08-04 LAB — MAGNESIUM: MAGNESIUM: 2.3 mg/dL (ref 1.7–2.4)

## 2017-08-04 MED ORDER — PANTOPRAZOLE SODIUM 40 MG PO TBEC
40.0000 mg | DELAYED_RELEASE_TABLET | Freq: Two times a day (BID) | ORAL | Status: DC
  Administered 2017-08-05 – 2017-08-09 (×10): 40 mg via ORAL
  Filled 2017-08-04 (×10): qty 1

## 2017-08-04 MED ORDER — POTASSIUM CHLORIDE 10 MEQ/100ML IV SOLN
10.0000 meq | INTRAVENOUS | Status: DC
  Administered 2017-08-04 (×2): 10 meq via INTRAVENOUS
  Filled 2017-08-04 (×2): qty 100

## 2017-08-04 MED ORDER — MAGNESIUM SULFATE IN D5W 1-5 GM/100ML-% IV SOLN
1.0000 g | Freq: Once | INTRAVENOUS | Status: AC
Start: 2017-08-04 — End: 2017-08-04
  Administered 2017-08-04: 1 g via INTRAVENOUS
  Filled 2017-08-04: qty 100

## 2017-08-04 MED ORDER — TOPIRAMATE 25 MG PO TABS: 25.0000 mg | ORAL_TABLET | ORAL | Status: DC

## 2017-08-04 MED ORDER — TOPIRAMATE 25 MG PO TABS
25.0000 mg | ORAL_TABLET | ORAL | Status: DC
  Administered 2017-08-05 – 2017-08-07 (×3): 25 mg via ORAL
  Filled 2017-08-04 (×3): qty 1

## 2017-08-04 MED ORDER — TOPIRAMATE 25 MG PO TABS
50.0000 mg | ORAL_TABLET | Freq: Two times a day (BID) | ORAL | Status: DC
  Administered 2017-08-05 – 2017-08-07 (×7): 50 mg via ORAL
  Filled 2017-08-04 (×7): qty 2

## 2017-08-04 NOTE — ED Notes (Signed)
Admitting provider at bedside.

## 2017-08-04 NOTE — ED Notes (Signed)
Patient transported to X-ray 

## 2017-08-04 NOTE — ED Notes (Signed)
Pt informed RN the new medication is Ocrevus that he received last Wednesday. IV medication that pt receives once a week (?)

## 2017-08-04 NOTE — Consult Note (Signed)
Neurology Consultation Reason for Consult: generalized weakness following Ocrelizumab infusion Referring Physician:     HPI: [name] is a 56 y.o. male relapsing remitting multiple sclerosis diagnosed in 2008, Hep C ( recently treated) and HTN  recently received Ocrelizumab infusion 6 days ago. He is being followed by Dr. Epimenio Foot and Dr. Lin Givens as an outpatient for his MS. The patient states that since his infusion, he has progressively gotten weaker and now unable to walk. He also states his speech . At baseline he he can walk with a walker for short distance and  he uses an electric wheelchair for long distances. He also complains of neck pain and and shoulder pain.He denies any new onset loss of vision, sudden worsening weakness in 1 extremity, new sensory changes. His potassium was 2.7 when checked in the ER.     ROS: A 14 point ROS was performed and is negative except as noted in the HPI.   Past Medical History:  Diagnosis Date  . Abdominal pain, unspecified site   . Anxiety   . Arthritis   . Benign paroxysmal positional vertigo   . Cellulitis and abscess of right leg 04/2017  . Chronic back pain   . Chronic pain syndrome 01/25/2008  . Cluster headache   . Depression    takes Zoloft daily  . DVT (deep venous thrombosis) (HCC)    in the left arm '09  . Gait abnormality    "uses mobile wheelchair, but is ambulatory"  . Gallstones 02/17/2009   resolved after gallbladder surgery.  [name] GERD (gastroesophageal reflux disease)    takes Omeprazole as needed  . Headache(784.0)    cluster headaches frequently-takes Topamax daily  . History of colonoscopy   . HTN (hypertension)    takes Lisinopril,Verapamil,and Triamterene HCTZ daily  . Insomnia 11/06/2008  . Joint pain   . Joint swelling    03-07-16 "swelling of right wrist" "after a fall-xray done 03-06-16 "no fractures".  . Memory loss    no an issue at present 03-07-16  . Multiple sclerosis (HCC)    Dx. 2005 - Dr. Tinnie Gens  follows LOV 4'17 tx. Tysabri monthly IV-[facility] , [place].  [name] Nonspecific elevation of levels of transaminase or lactic acid dehydrogenase (LDH)   . Other specified visual disturbances   . Other syndromes affecting cervical region   . Pneumonia 2009  . Trigeminal neuralgia     history" Multiple sclerosis"     Family History  Problem Relation Age of Onset  . Cancer Father   . Diabetes Mother      Social History:  reports that he has quit smoking. His smoking use included Cigarettes. He has a 38.00 pack-year smoking history. He has never used smokeless tobacco. He reports that he drinks alcohol. He reports that he does not use drugs.   Exam: Current vital signs: BP 128/78 (BP Location: Right Arm)   Pulse 61   Temp 98.1 F (36.7 C) (Oral)   Resp 16   SpO2 98%  Vital signs in last 24 hours: Temp:  [98.1 F (36.7 C)] 98.1 F (36.7 C) (09/25 1808) Pulse Rate:  [61] 61 (09/25 1808) Resp:  [16] 16 (09/25 1808) BP: (128)/(78) 128/78 (09/25 1808) SpO2:  [98 %] 98 % (09/25 1808)   Physical Exam  Constitutional: Appears well-developed and well-nourished.  Psych: Affect appropriate to situation Eyes: No scleral injection HENT: No OP obstrucion Head: Normocephalic.  Cardiovascular: Normal rate and regular rhythm.  Respiratory: Effort normal and breath  sounds normal to anterior ascultation GI: Soft.  No distension. There is no tenderness.  Skin: WDI  Neuro: Mental Status: Patient is awake, alert, oriented to person, place, month, year, and situation. Patient is able to give a clear and coherent history. No signs of aphasia, has dysarthric speech  Cranial Nerves: II: Pupils are equal, round, and reactive to light.  III,IV, VI: EOMI without ptosis or diploplia.  V: Facial sensation is symmetric to temperature VII: Facial movement is symmetric.  VIII: hearing is intact to voice X: Uvula elevates symmetrically XI: Shoulder shrug is symmetric. XII: tongue is  midline without atrophy or fasciculations.  Motor: Increased motor tone of bilateral lower extremities, right worse than left. The patient has 2/5 strength in the right lower extremity, 3 by finding the left lower extremity. 4/5 in both upper extremities Sensory: Sensation is symmetric to light touch and temperature in the arms and legs Deep Tendon Reflexes: 2+ in bilateral biceps, triceps, Reflexes diminished over bilateral patella and ankle Plantars: Toes are downgoing bilaterally.  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 Patient has significant weakness at baseline and likely worsened by dehydraton and hypokalemia.    Generalized weakness Multiple Sclerosis Hypokalemia   Recommendations Correct hypokalemia  Also please check CXR, UA for infection as patient is likely immunocompromised following his infusion.

## 2017-08-04 NOTE — ED Notes (Signed)
Pt provided with sandwich and sprite  

## 2017-08-04 NOTE — H&P (Signed)
History and Physical  Patient Name: [name]     ZOX:[id number]    DOB: 06/30/1961    DOA: 08/04/2017 PCP: Fleet Contras, MD  Patient coming from: Home  Chief Complaint: Weakness      HPI: [name] is a 56 y.o. male with a past medical history significant for MS wc bound on new anti-CD20, hx of DVT on Xarelto, HTN, and hep C recently treated who presents with weakness for 4 days.  The patient was in his usual state of health until over the weekend when he developed whole-body weakness. He is normally able to walk about half a block with a walker, but is usually wheelchair-bound. But over the weekend he's become so weak that he can barely stand, cannot walk 2 steps, and sometimes falls over because he cannot hold his body upon sitting up. There is no numbness, slurred speech. He's had neck pain and headache (although these are chronic), he has had abdominal pain, not worse with eating, position, or any other provoking factor. He's had some nausea and occasional vomiting, he has had cough, he has had frequent urination but no dysuria, he has had back pain but no fever. Of note, he recently last week right before symptoms started got the second of the first 2 infusions of ocrelizumab his anti-CD20 treatment.  He has not called his Neurologist about this new symptoms.  ED course: -Afebrile, heart rate 61, respirations pulse ox normal, blood pressure 128/78 -Na 140, K 2.7 (baseline 3.0), Cr 0.8, WBC 4.4K, Hgb 13.8 -Transaminases normal -He was given magnesium and potassium in case was discussed with neurology -TRH were asked evaluate for weakness     ROS: Review of Systems  Constitutional: Negative for chills, fever and malaise/fatigue.  Respiratory: Positive for cough. Negative for hemoptysis, sputum production, shortness of breath and wheezing.   Cardiovascular: Negative for chest pain.  Gastrointestinal: Positive for abdominal pain, nausea and vomiting. Negative for blood in  stool, constipation, diarrhea and melena.  Genitourinary: Positive for frequency. Negative for dysuria, flank pain, hematuria and urgency.  Musculoskeletal: Positive for back pain, falls and neck pain. Negative for joint pain and myalgias.  Skin: Negative for rash.  Neurological: Positive for weakness and headaches. Negative for dizziness, tingling, tremors, sensory change, speech change, focal weakness, seizures and loss of consciousness.  All other systems reviewed and are negative.         Past Medical History:  Diagnosis Date  . Abdominal pain, unspecified site   . Anxiety   . Arthritis   . Benign paroxysmal positional vertigo   . Cellulitis and abscess of right leg 04/2017  . Chronic back pain   . Chronic pain syndrome 01/25/2008  . Cluster headache   . Depression    takes Zoloft daily  . DVT (deep venous thrombosis) (HCC)    in the left arm '09  . Gait abnormality    "uses mobile wheelchair, but is ambulatory"  . Gallstones 02/17/2009   resolved after gallbladder surgery.  [name] GERD (gastroesophageal reflux disease)    takes Omeprazole as needed  . Headache(784.0)    cluster headaches frequently-takes Topamax daily  . History of colonoscopy   . HTN (hypertension)    takes Lisinopril,Verapamil,and Triamterene HCTZ daily  . Insomnia 11/06/2008  . Joint pain   . Joint swelling    03-07-16 "swelling of right wrist" "after a fall-xray done 03-06-16 "no fractures".  . Memory loss    no an issue at present 03-07-16  .  Multiple sclerosis (HCC)    Dx. 2005 - Dr. Tinnie Gens follows LOV 4'17 tx. Tysabri monthly IV-[facility] , [place].  [name] Nonspecific elevation of levels of transaminase or lactic acid dehydrogenase (LDH)   . Other specified visual disturbances   . Other syndromes affecting cervical region   . Pneumonia 2009  . Trigeminal neuralgia     history" Multiple sclerosis"    Past Surgical History:  Procedure Laterality Date  . CHOLECYSTECTOMY  02/20/2009    . COLONOSCOPY WITH PROPOFOL N/A 03/17/2016   Procedure: COLONOSCOPY WITH PROPOFOL;  Surgeon: [name], MD;  Location: [facility] ENDOSCOPY;  Service: Endoscopy;  Laterality: N/A;  . PORT A CATH REVISION N/A 07/06/2015   Procedure: Removal and replacement of PORT A CATH;  Surgeon: [name], MD;  Location: [facility] OR;  Service: General;  Laterality: N/A;  . PORTACATH PLACEMENT N/A 03/27/2014   Procedure: INSERTION PORT-A-CATH;  Surgeon: [name], MD;  Location: [facility] OR;  Service: General;  Laterality: N/A;    Social History: Patient lives with his mother and brother.  The patient walks with a walker short sistances, otherwise is WC bound.  Smoker.  Used to drive a truck.  No Known Allergies  Family history: family history includes Cancer in his father; Diabetes in his mother.  Prior to Admission medications   Medication Sig Start Date End Date Taking? Authorizing Provider  baclofen (LIORESAL) 20 MG tablet Take 20 mg by mouth 4 (four) times daily.     [provider]  dalfampridine 10 MG TB12 Take 1 tablet (10 mg total) by mouth 2 (two) times daily. 02/16/17   Sater, Pearletha Furl, MD  Eszopiclone (ESZOPICLONE) 3 MG TABS Take 3 mg by mouth at bedtime as needed (for sleep).     [provider]  gabapentin (NEURONTIN) 600 MG tablet Take 600 mg by mouth 4 (four) times daily.     [provider]  levETIRAcetam (KEPPRA) 500 MG tablet Take 500 mg by mouth 2 (two) times daily.    [provider]  omeprazole (PRILOSEC) 40 MG capsule Take 40 mg by mouth daily. 10/20/16   [provider]  Oxcarbazepine (TRILEPTAL) 300 MG tablet Take 300 mg by mouth 3 (three) times daily.    [provider]  oxyCODONE-acetaminophen (PERCOCET) 10-325 MG tablet Take 1 tablet by mouth every 4 (four) hours as needed for pain. 05/11/17   Richarda Overlie, MD  potassium chloride SA (K-DUR,KLOR-CON) 20 MEQ tablet Take 1 tablet (20 mEq total) by mouth daily. 05/11/17   Richarda Overlie, MD   sertraline (ZOLOFT) 50 MG tablet Take 50 mg by mouth daily.     [provider]  topiramate (TOPAMAX) 25 MG tablet Take 1 tablet (25 mg total) by mouth 2 (two) times daily. 12/19/16   Mancel Bale, MD  XARELTO 10 MG TABS tablet Take 10 mg by mouth daily. 04/09/17   [provider]       Physical Exam: BP 128/78 (BP Location: Right Arm)   Pulse 61   Temp 98.1 F (36.7 C) (Oral)   Resp 16   SpO2 98%  General appearance: Well-developed, adult male, alert and in mild distress from malaise, waeakness.   Eyes: Conjunctiva muddy, lids and lashes normal. PERRL.    ENT: No nasal deformity, discharge, epistaxis.  Hearing normal. OP dry without lesions.   Neck: No neck masses.  Trachea midline.  No thyromegaly/tenderness. Lymph: No cervical or supraclavicular lymphadenopathy. Skin: Warm and dry.   No suspicious  rashes or lesions. Cardiac: RRR, nl S1-S2, no murmurs appreciated.  Capillary refill is brisk.  JVP not visible.  No LE edema.  Radial pulses 2+ and symmetric. Respiratory: Normal respiratory rate and rhythm.  CTAB without rales or wheezes.  Atelectasis at bases. Abdomen: Abdomen soft.  No TTP, no guarding. No ascites, distension, hepatosplenomegaly.   MSK: No deformities or effusions.  No cyanosis or clubbing. Neuro: Cranial nerves normal.  Sensation intact to light touch. Speech is fluent.  Muscle strength 5/5 and symmetric in arms, 4/5 and symmetric in legs.  Has difficulty sitting up.    Psych: Sensorium intact and responding to questions, attention normal.  Behavior appropriate.  Affect blunted by fatigue.  Judgment and insight appear normal.     Labs on Admission:  I have personally reviewed following labs and imaging studies: CBC:  Recent Labs Lab 08/04/17 1822  WBC 4.4  NEUTROABS 2.6  HGB 13.8  HCT 42.0  MCV 80.3  PLT 253   Basic Metabolic Panel:  Recent Labs Lab 08/04/17 1822  NA 140  K 2.7*  CL 109  CO2 24  GLUCOSE 114*  BUN 6   CREATININE 0.81  CALCIUM 8.7*   GFR: CrCl cannot be calculated (Unknown ideal weight.).  Liver Function Tests:  Recent Labs Lab 08/04/17 1822  AST 15  ALT 17  ALKPHOS 81  BILITOT 0.7  PROT 6.7  ALBUMIN 3.6   No results for input(s): LIPASE, AMYLASE in the last 168 hours. No results for input(s): AMMONIA in the last 168 hours. Coagulation Profile: No results for input(s): INR, PROTIME in the last 168 hours. Cardiac Enzymes: No results for input(s): CKTOTAL, CKMB, CKMBINDEX, TROPONINI in the last 168 hours. BNP (last 3 results) No results for input(s): PROBNP in the last 8760 hours. HbA1C: No results for input(s): HGBA1C in the last 72 hours. CBG: No results for input(s): GLUCAP in the last 168 hours. Lipid Profile: No results for input(s): CHOL, HDL, LDLCALC, TRIG, CHOLHDL, LDLDIRECT in the last 72 hours. Thyroid Function Tests: No results for input(s): TSH, T4TOTAL, FREET4, T3FREE, THYROIDAB in the last 72 hours. Anemia Panel: No results for input(s): VITAMINB12, FOLATE, FERRITIN, TIBC, IRON, RETICCTPCT in the last 72 hours. Sepsis Labs:  Invalid input(s): PROCALCITONIN, LACTICIDVEN No results found for this or any previous visit (from the past 240 hour(s)).          Assessment/Plan Principal Problem:   Hypokalemia Active Problems:   Multiple sclerosis (HCC)   DVT (deep venous thrombosis) (HCC)   Essential hypertension   Chronic cluster headache, not intractable   Chronic hepatitis C virus infection (HCC)   Chronic pain syndrome   Major depressive disorder, recurrent episode (HCC)  1. Weakness:  Probably from hypokalemia, as well as he appears dry.  Am less familiar with transfusion reactions from anti-CD20 medications, but do not suspect a subacute weakness over 1 week is consistent.  Doubt infection, and no focal symptoms/sigsn, but given immune suppression, will obtain CXR and urine and blood cultures.  -Replace Mag, K -IV fluids overnight -Check UA,  blood cultures  2. Multiple sclersosis:  -Continue baclofen, dalfampridine, gabapentin  3. OTher medications:  -Continue Keppra, Trileptal, topamax -Continue Zoloft -Continue PPI -Continue oxycodone  4. Hypertension:  Blood pressure good.  No longer on Verapamil.  5. History of DVT:  -Continue Xarelto          DVT prophylaxis: N/A  Code Status: FULL  Family Communication: None present  Disposition Plan: Anticipate IV fluids, K  and Mag supplement and PT eval tomorrow.    Likely home tomorrow. Consults called: Neurology, by EDP Admission status: OBS At the point of initial evaluation, it is my clinical opinion that admission for OBSERVATION is reasonable and necessary because the patient's presenting complaints in the context of their chronic conditions represent sufficient risk of deterioration or significant morbidity to constitute reasonable grounds for close observation in the hospital setting, but that the patient may be medically stable for discharge from the hospital within 24 to 48 hours.    Medical decision making: Patient seen at 8:55 PM on 08/04/2017.  The patient was discussed with Dr. Lester Kinsman.  What exists of the patient's chart was reviewed in depth and summarized above.  Clinical condition: stable.        Alberteen Sam [facility] Pager [phone]

## 2017-08-04 NOTE — ED Provider Notes (Signed)
[facility]-EMERGENCY DEPT Provider Note   CSN: [id number] Arrival date & time: 08/04/17  1804     History   Chief Complaint Chief Complaint  Patient presents with  . Weakness    HPI [name] is a 56 y.o. male.  The history is provided by the patient and medical records. No language interpreter was used.    56 year old male history of MS, hep C, and HTN who presents with diffuse weakness after starting new MS medication.  Patient states he received initial infusion of Ocrevus 6 days ago. For the past several days he has noted worsening diffuse weakness. No other medication changes. Pt's Neurologist is Dr. Leotis Shames. He previously completed 12 weeks of Vosevi for Hep C.  Past Medical History:  Diagnosis Date  . Abdominal pain, unspecified site   . Anxiety   . Arthritis   . Benign paroxysmal positional vertigo   . Cellulitis and abscess of right leg 04/2017  . Chronic back pain   . Chronic pain syndrome 01/25/2008  . Cluster headache   . Depression    takes Zoloft daily  . DVT (deep venous thrombosis) (HCC)    in the left arm '09  . Gait abnormality    "uses mobile wheelchair, but is ambulatory"  . Gallstones 02/17/2009   resolved after gallbladder surgery.  [name] GERD (gastroesophageal reflux disease)    takes Omeprazole as needed  . Headache(784.0)    cluster headaches frequently-takes Topamax daily  . History of colonoscopy   . HTN (hypertension)    takes Lisinopril,Verapamil,and Triamterene HCTZ daily  . Insomnia 11/06/2008  . Joint pain   . Joint swelling    03-07-16 "swelling of right wrist" "after a fall-xray done 03-06-16 "no fractures".  . Memory loss    no an issue at present 03-07-16  . Multiple sclerosis (HCC)    Dx. 2005 - Dr. Tinnie Gens follows LOV 4'17 tx. Tysabri monthly IV-[facility] , [place].  [name] Nonspecific elevation of levels of transaminase or lactic acid dehydrogenase (LDH)   . Other specified visual disturbances   . Other syndromes  affecting cervical region   . Pneumonia 2009  . Trigeminal neuralgia     history" Multiple sclerosis"    Patient Active Problem List   Diagnosis Date Noted  . Cellulitis of right leg 05/07/2017  . Right carpal tunnel syndrome 02/16/2017  . Gait disturbance 01/22/2017  . Insomnia 01/22/2017  . Depression with anxiety 01/22/2017  . High risk medication use 01/22/2017  . Major depressive disorder, recurrent episode (HCC) 12/19/2016  . Suicidal ideation   . AKI (acute kidney injury) (HCC) 11/15/2016  . Chronic hepatitis C virus infection (HCC) 11/15/2016  . Chronic abdominal pain 11/15/2016  . Chronic pain syndrome 11/15/2016  . Acute retention of urine 11/15/2016  . Generalized abdominal pain   . Chronic cluster headache, not intractable   . Community acquired pneumonia   . TB lung, latent   . HCAP (healthcare-associated pneumonia) 02/16/2016  . Hypokalemia 02/16/2016  . Intractable cluster headache syndrome   . DVT (deep venous thrombosis) (HCC)   . Depression   . GERD (gastroesophageal reflux disease)   . HTN (hypertension)   . Essential hypertension   . Gastroesophageal reflux disease without esophagitis   . CAP (community acquired pneumonia)   . Multiple sclerosis (HCC) 08/02/2013  . ABDOMINAL BLOATING 10/14/2010  . LOOSE STOOLS 10/14/2010  . PULMONARY EMBOLISM, HX OF 10/14/2010    Past Surgical History:  Procedure Laterality Date  . CHOLECYSTECTOMY  02/20/2009  . COLONOSCOPY WITH PROPOFOL N/A 03/17/2016   Procedure: COLONOSCOPY WITH PROPOFOL;  Surgeon: [name], MD;  Location: [facility] ENDOSCOPY;  Service: Endoscopy;  Laterality: N/A;  . PORT A CATH REVISION N/A 07/06/2015   Procedure: Removal and replacement of PORT A CATH;  Surgeon: [name], MD;  Location[facility];  Service: General;  Laterality: N/A;  . PORTACATH PLACEMENT N/A 03/27/2014   Procedure: INSERTION PORT-A-CATH;  Surgeon: [name], MD;  Location: [facility] OR;  Service: General;  Laterality: N/A;        Home Medications    Prior to Admission medications   Medication Sig Start Date End Date Taking? Authorizing Provider  baclofen (LIORESAL) 20 MG tablet Take 20 mg by mouth 4 (four) times daily.    Yes [provider]  dalfampridine 10 MG TB12 Take 1 tablet (10 mg total) by mouth 2 (two) times daily. 02/16/17  Yes Sater, Pearletha Furl, MD  Eszopiclone (ESZOPICLONE) 3 MG TABS Take 3 mg by mouth at bedtime as needed (for sleep).    Yes [provider]  gabapentin (NEURONTIN) 600 MG tablet Take 600 mg by mouth 4 (four) times daily.    Yes [provider]  hydrochlorothiazide (HYDRODIURIL) 25 MG tablet Take 25 mg by mouth daily as needed (for fluid).   Yes [provider]  levETIRAcetam (KEPPRA) 500 MG tablet Take 500 mg by mouth 2 (two) times daily.   Yes [provider]  lisinopril-hydrochlorothiazide (PRINZIDE,ZESTORETIC) 10-12.5 MG tablet Take 1 tablet by mouth daily.   Yes [provider]  ocrelizumab (OCREVUS) 300 MG/10ML injection Inject 300 mg into the vein every 30 (thirty) days.   Yes [provider]  omeprazole (PRILOSEC) 20 MG capsule Take 20 mg by mouth daily.   Yes [provider]  omeprazole (PRILOSEC) 40 MG capsule Take 40 mg by mouth every evening.  10/20/16  Yes [provider]  Oxcarbazepine (TRILEPTAL) 300 MG tablet Take 300 mg by mouth 3 (three) times daily.   Yes [provider]  oxyCODONE-acetaminophen (PERCOCET) 10-325 MG tablet Take 1 tablet by mouth every 4 (four) hours as needed for pain. 05/11/17  Yes Richarda Overlie, MD  sertraline (ZOLOFT) 50 MG tablet Take 50 mg by mouth daily.    Yes [provider]  topiramate (TOPAMAX) 25 MG tablet Take 1 tablet (25 mg total) by mouth 2 (two) times daily. Patient taking differently: Take 25-50 mg by mouth See admin instructions. Take 2 tablets every morning and at night then take 1 tablet at 3 pm 12/19/16  Yes Mancel Bale, MD   triamterene-hydrochlorothiazide (MAXZIDE-25) 37.5-25 MG tablet Take 1 tablet by mouth daily as needed (for fluid).   Yes [provider]  verapamil (CALAN) 80 MG tablet Take 80 mg by mouth 2 (two) times daily.   Yes [provider]  XARELTO 10 MG TABS tablet Take 10 mg by mouth daily. 04/09/17  Yes [provider]    Family History Family History  Problem Relation Age of Onset  . Cancer Father   . Diabetes Mother     Social History Social History  Substance Use Topics  . Smoking status: Former Smoker    Packs/day: 1.00    Years: 38.00    Types: Cigarettes  . Smokeless tobacco: Never Used     Comment: cutting back  . Alcohol use 0.0 oz/week     Comment: occasional     Allergies   Patient has no known allergies.  Review of Systems Review of Systems  Constitutional: Negative for chills and fever.  HENT: Positive for trouble swallowing. Negative for ear pain and sore throat.   Eyes: Negative for pain and visual disturbance.  Respiratory: Negative for cough and shortness of breath.   Cardiovascular: Negative for chest pain and palpitations.  Gastrointestinal: Negative for abdominal pain and vomiting.  Genitourinary: Negative for dysuria and hematuria.  Musculoskeletal: Negative for arthralgias and back pain.  Skin: Negative for color change and rash.  Neurological: Positive for speech difficulty and weakness. Negative for seizures and syncope.  All other systems reviewed and are negative.    Physical Exam Updated Vital Signs BP (!) 104/59 (BP Location: Right Arm)   Pulse (!) 54   Temp 97.8 F (36.6 C) (Oral)   Resp 18   Ht  (1.753 m)   Wt 82 kg (180 lb 12.4 oz)   SpO2 98%   BMI 26.70 kg/m   Physical Exam  Constitutional: He appears well-developed. He has a sickly appearance. He appears ill.  HENT:  Head: Normocephalic and atraumatic.  Eyes: Conjunctivae are normal.  Bilateral ptosis  Neck: Neck supple.  Cardiovascular:  Normal rate and regular rhythm.   No murmur heard. Pulmonary/Chest: Effort normal and breath sounds normal. No respiratory distress.  Abdominal: Soft. There is no tenderness.  Musculoskeletal: He exhibits no edema.  Neurological: He is alert. No cranial nerve deficit. Coordination normal.  Diffuse symmetric global weakness 2/5. Unable to resist gravity in all extremities.   Skin: Skin is warm and dry.  Nursing note and vitals reviewed.    ED Treatments / Results  Labs (all labs ordered are listed, but only abnormal results are displayed) Labs Reviewed  CBC WITH DIFFERENTIAL/PLATELET - Abnormal; Notable for the following:       Result Value   RDW 16.2 (*)    All other components within normal limits  COMPREHENSIVE METABOLIC PANEL - Abnormal; Notable for the following:    Potassium 2.7 (*)    Glucose, Bld 114 (*)    Calcium 8.7 (*)    All other components within normal limits  URINALYSIS, ROUTINE W REFLEX MICROSCOPIC - Abnormal; Notable for the following:    APPearance CLOUDY (*)    All other components within normal limits  BASIC METABOLIC PANEL - Abnormal; Notable for the following:    Potassium 3.4 (*)    CO2 21 (*)    Glucose, Bld 100 (*)    BUN 5 (*)    Calcium 8.7 (*)    All other components within normal limits  CBC - Abnormal; Notable for the following:    RDW 16.3 (*)    All other components within normal limits  CULTURE, BLOOD (ROUTINE X 2)  CULTURE, BLOOD (ROUTINE X 2)  URINE CULTURE  MRSA PCR SCREENING  MAGNESIUM    EKG  EKG Interpretation None       Radiology Dg Chest 2 View  Result Date: 08/04/2017 CLINICAL DATA:  Generalized chest pain, dyspnea and dry cough. EXAM: CHEST  2 VIEW COMPARISON:  12/03/2016 FINDINGS: The heart size and mediastinal contours are within normal limits. Port catheter tip from right-sided IJ approach is noted at the cavoatrial junction. Both lungs are clear. The visualized skeletal structures are unremarkable. IMPRESSION: No  active cardiopulmonary disease. Electronically Signed   By: Tollie Eth M.D.   On: 08/04/2017 21:56    Procedures Procedures (including critical care time)  Medications Ordered in ED Medications  rivaroxaban (XARELTO) tablet 10  mg (not administered)  Oxcarbazepine (TRILEPTAL) tablet 300 mg (not administered)  dalfampridine TB12 10 mg (10 mg Oral Not Given 08/05/17 0350)  pantoprazole (PROTONIX) EC tablet 40 mg (40 mg Oral Given 08/05/17 0248)  levETIRAcetam (KEPPRA) tablet 500 mg (not administered)  zolpidem (AMBIEN) tablet 5 mg (not administered)  baclofen (LIORESAL) tablet 20 mg (not administered)  gabapentin (NEURONTIN) tablet 600 mg (not administered)  sertraline (ZOLOFT) tablet 50 mg (not administered)  0.9 % NaCl with KCl 40 mEq / L  infusion (100 mL/hr Intravenous New Bag/Given 08/05/17 0247)  acetaminophen (TYLENOL) tablet 650 mg (not administered)    Or  acetaminophen (TYLENOL) suppository 650 mg (not administered)  ondansetron (ZOFRAN) tablet 4 mg (not administered)    Or  ondansetron (ZOFRAN) injection 4 mg (not administered)  potassium chloride SA (K-DUR,KLOR-CON) CR tablet 40 mEq (20 mEq Oral Given 08/05/17 0247)  topiramate (TOPAMAX) tablet 50 mg (50 mg Oral Given 08/05/17 0247)  topiramate (TOPAMAX) tablet 25 mg (not administered)  oxyCODONE-acetaminophen (PERCOCET/ROXICET) 5-325 MG per tablet 1 tablet (not administered)    And  oxyCODONE (Oxy IR/ROXICODONE) immediate release tablet 5 mg (not administered)  magnesium sulfate IVPB 1 g 100 mL (0 g Intravenous Stopped 08/04/17 2326)  potassium chloride 10 mEq in 100 mL IVPB (10 mEq Intravenous New Bag/Given 08/05/17 0701)     Initial Impression / Assessment and Plan / ED Course  I have reviewed the triage vital signs and the nursing notes.  Pertinent labs & imaging results that were available during my care of the patient were reviewed by me and considered in my medical decision making (see chart for details).     34  yoM h/o MS who p/w diffuse global weakness after starting new immunotherapy 6 days ago. No changes to baclofen or other new medications. Protecting airway. AF, VSS. Diffuse global weakness and severely diminished strength (normally ambulatory at baseline). No numbness. Doubt CVA as globally weak. No speech abnormality.    Potassium 2.7, though symptoms appear out of proportion with level. Temporal relation with recent Ocrevus infusion. Neurology consulted and evaluated pt.    Pt admitted for further management and evaluation. Pt stable at time of transfer.  Pt care d/w Dr. Anitra Lauth  Final Clinical Impressions(s) / ED Diagnoses   Final diagnoses:  Weakness    New Prescriptions Current Discharge Medication List       Hebert Soho, MD 08/05/17 4540    Gwyneth Sprout, MD 08/05/17 1430

## 2017-08-04 NOTE — ED Triage Notes (Signed)
Pt has hx of MS, started on new medication yesterday per EMS. Pt has felt "really off", weak, generalized pain, cannot stand, and has been slurring words since yesterday. Pt is from home. BP 124/76, HR 74, RR 18.

## 2017-08-05 DIAGNOSIS — I82499 Acute embolism and thrombosis of other specified deep vein of unspecified lower extremity: Secondary | ICD-10-CM | POA: Diagnosis not present

## 2017-08-05 DIAGNOSIS — G35 Multiple sclerosis: Principal | ICD-10-CM

## 2017-08-05 DIAGNOSIS — E876 Hypokalemia: Secondary | ICD-10-CM | POA: Diagnosis not present

## 2017-08-05 DIAGNOSIS — R531 Weakness: Secondary | ICD-10-CM | POA: Diagnosis not present

## 2017-08-05 LAB — BASIC METABOLIC PANEL
ANION GAP: 8 (ref 5–15)
BUN: 5 mg/dL — ABNORMAL LOW (ref 6–20)
CALCIUM: 8.7 mg/dL — AB (ref 8.9–10.3)
CO2: 21 mmol/L — ABNORMAL LOW (ref 22–32)
CREATININE: 0.8 mg/dL (ref 0.61–1.24)
Chloride: 109 mmol/L (ref 101–111)
GFR calc non Af Amer: >60 mL/min (ref >60)
Glucose, Bld: 100 mg/dL — ABNORMAL HIGH (ref 65–99)
Potassium: 3.4 mmol/L — ABNORMAL LOW (ref 3.5–5.1)
Sodium: 138 mmol/L (ref 135–145)

## 2017-08-05 LAB — URINALYSIS, ROUTINE W REFLEX MICROSCOPIC
Bilirubin Urine: NEGATIVE
Glucose, UA: NEGATIVE mg/dL
Hgb urine dipstick: NEGATIVE
KETONES UR: NEGATIVE mg/dL
LEUKOCYTES UA: NEGATIVE
NITRITE: NEGATIVE
PROTEIN: NEGATIVE mg/dL
Specific Gravity, Urine: 1.011 (ref 1.005–1.030)
pH: 7 (ref 5.0–8.0)

## 2017-08-05 LAB — MRSA PCR SCREENING: MRSA BY PCR: NEGATIVE

## 2017-08-05 LAB — CBC
HCT: 41.1 % (ref 39.0–52.0)
Hemoglobin: 13.4 g/dL (ref 13.0–17.0)
MCH: 26.1 pg (ref 26.0–34.0)
MCHC: 32.6 g/dL (ref 30.0–36.0)
MCV: 80 fL (ref 78.0–100.0)
PLATELETS: 243 10*3/uL (ref 150–400)
RBC: 5.14 MIL/uL (ref 4.22–5.81)
RDW: 16.3 % — ABNORMAL HIGH (ref 11.5–15.5)
WBC: 5.1 10*3/uL (ref 4.0–10.5)

## 2017-08-05 MED ORDER — OXCARBAZEPINE 300 MG PO TABS
300.0000 mg | ORAL_TABLET | Freq: Three times a day (TID) | ORAL | Status: DC
  Administered 2017-08-05 – 2017-08-09 (×12): 300 mg via ORAL
  Filled 2017-08-05 (×12): qty 1

## 2017-08-05 MED ORDER — ZOLPIDEM TARTRATE 5 MG PO TABS
5.0000 mg | ORAL_TABLET | Freq: Every evening | ORAL | Status: DC | PRN
  Administered 2017-08-05 – 2017-08-08 (×3): 5 mg via ORAL
  Filled 2017-08-05 (×4): qty 1

## 2017-08-05 MED ORDER — POTASSIUM CHLORIDE CRYS ER 20 MEQ PO TBCR
40.0000 meq | EXTENDED_RELEASE_TABLET | Freq: Two times a day (BID) | ORAL | Status: DC
  Administered 2017-08-05 (×2): 40 meq via ORAL
  Administered 2017-08-05: 20 meq via ORAL
  Administered 2017-08-06 (×2): 40 meq via ORAL
  Filled 2017-08-05 (×5): qty 2

## 2017-08-05 MED ORDER — DALFAMPRIDINE ER 10 MG PO TB12: 10.0000 mg | ORAL_TABLET | Freq: Two times a day (BID) | ORAL | Status: DC

## 2017-08-05 MED ORDER — POTASSIUM CHLORIDE 10 MEQ/100ML IV SOLN
10.0000 meq | INTRAVENOUS | Status: AC
  Administered 2017-08-05 (×4): 10 meq via INTRAVENOUS
  Filled 2017-08-05 (×3): qty 100

## 2017-08-05 MED ORDER — OXYCODONE-ACETAMINOPHEN 5-325 MG PO TABS
1.0000 | ORAL_TABLET | ORAL | Status: DC | PRN
  Administered 2017-08-05 – 2017-08-08 (×7): 1 via ORAL
  Filled 2017-08-05 (×7): qty 1

## 2017-08-05 MED ORDER — ONDANSETRON HCL 4 MG/2ML IJ SOLN: 4.0000 mg | Freq: Four times a day (QID) | INTRAMUSCULAR | Status: DC | PRN

## 2017-08-05 MED ORDER — POTASSIUM CHLORIDE IN NACL 40-0.9 MEQ/L-% IV SOLN
INTRAVENOUS | Status: AC
  Administered 2017-08-05: 100 mL/h via INTRAVENOUS
  Filled 2017-08-05 (×2): qty 1000

## 2017-08-05 MED ORDER — RIVAROXABAN 10 MG PO TABS
10.0000 mg | ORAL_TABLET | Freq: Every day | ORAL | Status: DC
  Administered 2017-08-05 – 2017-08-09 (×5): 10 mg via ORAL
  Filled 2017-08-05 (×5): qty 1

## 2017-08-05 MED ORDER — ONDANSETRON HCL 4 MG PO TABS: 4.0000 mg | ORAL_TABLET | Freq: Four times a day (QID) | ORAL | Status: DC | PRN

## 2017-08-05 MED ORDER — SERTRALINE HCL 50 MG PO TABS
50.0000 mg | ORAL_TABLET | Freq: Every day | ORAL | Status: DC
  Administered 2017-08-05 – 2017-08-09 (×5): 50 mg via ORAL
  Filled 2017-08-05 (×5): qty 1

## 2017-08-05 MED ORDER — GABAPENTIN 600 MG PO TABS
600.0000 mg | ORAL_TABLET | Freq: Four times a day (QID) | ORAL | Status: DC
  Administered 2017-08-05 – 2017-08-09 (×16): 600 mg via ORAL
  Filled 2017-08-05 (×16): qty 1

## 2017-08-05 MED ORDER — OXYCODONE-ACETAMINOPHEN 10-325 MG PO TABS: 1.0000 | ORAL_TABLET | ORAL | Status: DC | PRN

## 2017-08-05 MED ORDER — BACLOFEN 20 MG PO TABS
20.0000 mg | ORAL_TABLET | Freq: Four times a day (QID) | ORAL | Status: DC
  Administered 2017-08-05 – 2017-08-09 (×16): 20 mg via ORAL
  Filled 2017-08-05 (×16): qty 1

## 2017-08-05 MED ORDER — LEVETIRACETAM 500 MG PO TABS
500.0000 mg | ORAL_TABLET | Freq: Two times a day (BID) | ORAL | Status: DC
  Administered 2017-08-05 – 2017-08-09 (×9): 500 mg via ORAL
  Filled 2017-08-05 (×9): qty 1

## 2017-08-05 MED ORDER — OXYCODONE HCL 5 MG PO TABS
5.0000 mg | ORAL_TABLET | ORAL | Status: DC | PRN
  Administered 2017-08-05 – 2017-08-08 (×7): 5 mg via ORAL
  Filled 2017-08-05 (×7): qty 1

## 2017-08-05 MED ORDER — ACETAMINOPHEN 325 MG PO TABS
650.0000 mg | ORAL_TABLET | Freq: Four times a day (QID) | ORAL | Status: DC | PRN
Start: 2017-08-05 — End: 2017-08-09

## 2017-08-05 MED ORDER — ACETAMINOPHEN 650 MG RE SUPP: 650.0000 mg | Freq: Four times a day (QID) | RECTAL | Status: DC | PRN

## 2017-08-05 NOTE — Progress Notes (Signed)
VC-2L NIF -30

## 2017-08-05 NOTE — Evaluation (Signed)
Physical Therapy Evaluation Patient Details Name: [name] MRN: [id number] DOB: 09-07-1961 Today's Date: 08/05/2017   History of Present Illness  [name] is a 56 y.o. male with a past medical history significant for MS, being w/c bound on new anti-CD20, hx of DVT on Xarelto, HTN, and hep C (treated), who presents with weakness for 4 days.  Work up includes MS Flare, MRI pending.  Clinical Impression  Pt admitted with/for significant weakness, suspected MS flare, MRI pending.  Pt needing mod assist for bed mobility at this time and had difficulty clearing his buttock from the bed, though not assisted.  If pt to go home after d/c, will need assist of PCA.  Pt currently limited functionally due to the problems listed below.  (see problems list.)  Pt will benefit from PT to maximize function and safety to be able to get home safely with available assist of family and PCA.     Follow Up Recommendations Home health PT    Equipment Recommendations  None recommended by PT    Recommendations for Other Services       Precautions / Restrictions Precautions Precautions: Fall      Mobility  Bed Mobility Overal bed mobility: Needs Assistance Bed Mobility: Rolling;Sidelying to Sit;Sit to Supine Rolling: Mod assist Sidelying to sit: Mod assist   Sit to supine: Mod assist   General bed mobility comments: pt using L>R UE to greatly assist. LE's needed to be assisted, esp, R LE.  Heavy use of the rail.  Transfers Overall transfer level: Needs assistance Equipment used: Ambulation equipment used Transfers: Sit to/from Stand Sit to Stand: Min guard         General transfer comment: pt came off the bed into a squat with R hand assist  on chair in front.  He cleared enough to get pad under buttocks, but did not want to do more.  Ambulation/Gait             General Gait Details: not tested  Stairs            Wheelchair Mobility    Modified Rankin (Stroke  Patients Only)       Balance Overall balance assessment: Needs assistance Sitting-balance support: Feet supported;No upper extremity supported;Single extremity supported Sitting balance-Leahy Scale: Fair Sitting balance - Comments: any challenge and pt will fall off in that direction                                     Pertinent Vitals/Pain Pain Assessment: Faces Pain Score: 6  Faces Pain Scale: Hurts even more Pain Location: L side of body Pain Descriptors / Indicators: Pins and needles (parasthesias) Pain Intervention(s): Monitored during session    Home Living Family/patient expects to be discharged to:: Private residence Living Arrangements: Parent;Other (Comment) (older brother) Available Help at Discharge: Family;Personal care attendant;Available 24 hours/day Type of Home: House Home Access: Ramped entrance     Home Layout: One level Home Equipment: Walker - 2 wheels;Wheelchair - [employer];Shower seat;Grab bars - tub/shower      Prior Function Level of Independence: Needs assistance   Gait / Transfers Assistance Needed: pt reports he uses RW vs w/c depending on how he feels that day. Can transfer to w/c without assist  ADL's / Homemaking Assistance Needed: pt reports he does his own ADL's, chart states, pt reports he has aide 7 days per week for 8 hours  per day. aide assists with bathing, dressing, and household tasks        Hand Dominance        Extremity/Trunk Assessment   Upper Extremity Assessment Upper Extremity Assessment: RUE deficits/detail;LUE deficits/detail RUE Deficits / Details: Weak at wrist, but grip >3/5, gross flexion 3/5, gross ext >3/5, heavy parastesias/painful RUE Coordination: decreased fine motor LUE Deficits / Details: weak at 4/5 grossly and functional to assist mobility.    Lower Extremity Assessment Lower Extremity Assessment: RLE deficits/detail;LLE deficits/detail RLE Deficits / Details: significant  weakness hip flexors >=2/5, quads 3-, gross ext. 3/5, df 2/5, pf >1/5;  painful parastesias. RLE Coordination: decreased fine motor;decreased gross motor LLE Deficits / Details: hip flexors 3-, quads >3/5, hams 3-/5, df 3/5, pf 3/5, gross ext 4-/5, LLE Coordination: decreased fine motor       Communication   Communication: Expressive difficulties  Cognition Arousal/Alertness: Awake/alert Behavior During Therapy: Restless;WFL for tasks assessed/performed Overall Cognitive Status: Within Functional Limits for tasks assessed                                        General Comments      Exercises Other Exercises Other Exercises: P/AAROM to Bil LE, due to significant stiffness.   Assessment/Plan    PT Assessment Patient needs continued PT services  PT Problem List Decreased activity tolerance;Decreased range of motion;Decreased balance;Decreased mobility;Decreased coordination;Decreased knowledge of use of DME;Impaired sensation;Pain       PT Treatment Interventions DME instruction;Functional mobility training;Therapeutic activities;Balance training;Patient/family education;Therapeutic exercise    PT Goals (Current goals can be found in the Care Plan section)  Acute Rehab PT Goals Patient Stated Goal: figure out why I;m so weak and why I dizzy. PT Goal Formulation: With patient Time For Goal Achievement: 08/19/17 Potential to Achieve Goals: Fair    Frequency Min 3X/week   Barriers to discharge        Co-evaluation               AM-PAC PT "6 Clicks" Daily Activity  Outcome Measure Difficulty turning over in bed (including adjusting bedclothes, sheets and blankets)?: Unable Difficulty moving from lying on back to sitting on the side of the bed? : Unable Difficulty sitting down on and standing up from a chair with arms (e.g., wheelchair, bedside commode, etc,.)?: Unable Help needed moving to and from a bed to chair (including a wheelchair)?: A Lot Help  needed walking in hospital room?: A Lot Help needed climbing 3-5 steps with a railing? : A Lot 6 Click Score: 9    End of Session   Activity Tolerance: Patient tolerated treatment well Patient left: in bed;with call bell/phone within reach;with bed alarm set Nurse Communication: Mobility status PT Visit Diagnosis: Muscle weakness (generalized) (M62.81);Other symptoms and signs involving the nervous system (R29.898);Pain Pain - Right/Left: Left Pain - part of body:  (hemibody)    Time: 3086-5784 PT Time Calculation (min) (ACUTE ONLY): 34 min   Charges:   PT Evaluation $PT Eval Moderate Complexity: 1 Mod PT Treatments $Therapeutic Activity: 8-22 mins   PT G Codes:   PT G-Codes **NOT FOR INPATIENT CLASS** Functional Assessment Tool Used: AM-PAC 6 Clicks Basic Mobility;Clinical judgement Functional Limitation: Mobility: Walking and moving around Mobility: Walking and Moving Around Current Status (O9629): At least 40 percent but less than 60 percent impaired, limited or restricted Mobility: Walking and Moving Around Goal Status [phone]):  At least 20 percent but less than 40 percent impaired, limited or restricted    08/05/2017  Sunbright Bing, PT [phone]  (pager)  Eliseo Gum Fay Bagg 08/05/2017, 11:38 AM

## 2017-08-05 NOTE — Progress Notes (Signed)
Subjective: Continues to feel weak in his lower extremities however has no weakness in his upper extremities. He is complaining of some numbness and tingling in his right arm which is very consistent with his MS flares.  Exam: Vitals:   08/05/17 0139 08/05/17 0453  BP: 117/60 (!) 104/59  Pulse: (!) 53 (!) 54  Resp: 20 18  Temp: (!) 97.5 F (36.4 C) 97.8 F (36.6 C)  SpO2: 97% 98%    HEENT-  Normocephalic, no lesions, without obvious abnormality.  Normal external eye and conjunctiva.  Normal TM's bilaterally.  Normal auditory canals and external ears. Normal external nose, mucus membranes and septum.  Normal pharynx.    Neuro: alert oriented and able to follow commands CN: Pupils are equal and round. They are symmetrically reactive from 3-->2 mm. EOMI without nystagmus. Facial sensation is intact to light touch. Face is symmetric at rest with normal strength and mobility. Hearing is intact to conversational voice. Palate elevates symmetrically and uvula is midline. Voice is normal in tone, pitch and quality. Bilateral SCM and trapezii are 5/5. Tongue is midline with normal bulk and mobility.  Motor: upper extremity 5/5 throughout. Increased motor tone of bilateral lower extremities, right worse than left--holds right leg outstretched and I cannot bend his leg at the knee. Left dorsi flexion and plantar flexion 5/5 right dorsiflexion plantar flexion 4/5.  Sensation: Intact to light touch.  DTRs: 2+, symmetric  Diminished reflexes in the lower extremities    Pertinent Labs/Diagnostics: CMP Latest Ref Rng & Units 08/05/2017 08/04/2017 07/21/2017  Glucose 65 - 99 mg/dL 122(Q) 825(O) 037(C)  BUN 6 - 20 mg/dL 5(L) 6 12  Creatinine 4.88 - 1.24 mg/dL 8.91 6.94 5.03  Sodium 135 - 145 mmol/L 138 140 139  Potassium 3.5 - 5.1 mmol/L 3.4(L) 2.7(LL) 3.0(L)  Chloride 101 - 111 mmol/L 109 109 103  CO2 22 - 32 mmol/L 21(L) 24 26  Calcium 8.9 - 10.3 mg/dL 8.8(E) 2.8(M) 9.6  Total Protein 6.5 - 8.1  g/dL - 6.7 7.6  Total Bilirubin 0.3 - 1.2 mg/dL - 0.7 0.4  Alkaline Phos 38 - 126 U/L - 81 -  AST 15 - 41 U/L - 15 23  ALT 17 - 63 U/L - 17 27   Urine dipstick shows negative for all components.    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 Patient has significant weakness at baseline and likely worsened by dehydraton and hypokalemia.   Continues to feel very weak in the lower extremities. I would favor assessing for signs of acute MS exacerbation with further imaging  Generalized weakness Multiple Sclerosis Hypokalemia   Recommendations Obtain MRI of brain and cervical spine to look for active lesions   Ritta Slot, MD [facility] [phone]  If 7pm- 7am, please page neurology on call as listed in AMION.  08/05/2017, 9:47 AM

## 2017-08-05 NOTE — Progress Notes (Signed)
Initial Nutrition Assessment  DOCUMENTATION CODES:   Not applicable  INTERVENTION:   -Ensure Enlive po BID, each supplement provides 350 kcal and 20 grams of protein  NUTRITION DIAGNOSIS:   Predicted suboptimal nutrient intake related to acute illness as evidenced by meal completion < 50%.  GOAL:   Patient will meet greater than or equal to 90% of their needs  MONITOR:   PO intake, Supplement acceptance, Labs, Weight trends, Skin, I & O's  REASON FOR ASSESSMENT:   Malnutrition Screening Tool    ASSESSMENT:   [name] is a 56 y.o. male with a past medical history significant for MS wc bound on new anti-CD20, hx of DVT on Xarelto, HTN, and hep C recently treated who presents with weakness for 4 days.  Pt admitted with hypokalemia.   Spoke with pt, who reports decreased appetite. Noted meal completion 50%. Interview was cut short, due to pt speaking on the phone and did not disengage in conversation.   Per wt hx, UBW around 195#. Noted a 7.6% wt loss over the past 8 months, which while not significant, is concerning when coupled with poor oral intake.   Nutrition-Focused physical exam completed. Findings are no fat depletion, no muscle depletion, and no edema.   Labs reviewed: K: 3.4 (on PO supplementation).   Diet Order:  Diet regular Room service appropriate? Yes; Fluid consistency: Thin  Skin:  Reviewed, no issues  Last BM:  08/04/17  Height:   Ht Readings from Last 1 Encounters:  08/05/17 5\' 9"  (1.753 m)    Weight:   Wt Readings from Last 1 Encounters:  08/05/17 180 lb 12.4 oz (82 kg)    Ideal Body Weight:  72.7 kg  BMI:  Body mass index is 26.7 kg/m.  Estimated Nutritional Needs:   Kcal:  1800-2000  Protein:  90-105 grams  Fluid:  1.8-2.0 L  EDUCATION NEEDS:   No education needs identified at this time  Titus Drone A. Mayford Knife, RD, LDN, CDE Pager: [phone] After hours Pager: [phone]

## 2017-08-05 NOTE — Discharge Instructions (Signed)
Information on my medicine - XARELTO® (Rivaroxaban) ° °This medication education was reviewed with me or my healthcare representative as part of my discharge preparation.   °Why was Xarelto® prescribed for you? °Xarelto® was prescribed for you to reduce the risk of blood clots forming. The medical term for these abnormal blood clots is venous thromboembolism (VTE). ° °What do you need to know about xarelto® ? °Take your Xarelto® ONCE DAILY at the same time every day. °You may take it either with or without food. ° °If you have difficulty swallowing the tablet whole, you may crush it and mix in applesauce just prior to taking your dose. ° °Take Xarelto® exactly as prescribed by your doctor and DO NOT stop taking Xarelto® without talking to the doctor who prescribed the medication.  Stopping without other VTE prevention medication to take the place of Xarelto® may increase your risk of developing a clot. ° °After discharge, you should have regular check-up appointments with your healthcare provider that is prescribing your Xarelto®.   ° °What do you do if you miss a dose? °If you miss a dose, take it as soon as you remember on the same day then continue your regularly scheduled once daily regimen the next day. Do not take two doses of Xarelto® on the same day.  ° °Important Safety Information °A possible side effect of Xarelto® is bleeding. You should call your healthcare provider right away if you experience any of the following: °? Bleeding from an injury or your nose that does not stop. °? Unusual colored urine (red or dark brown) or unusual colored stools (red or black). °? Unusual bruising for unknown reasons. °? A serious fall or if you hit your head (even if there is no bleeding). ° °Some medicines may interact with Xarelto® and might increase your risk of bleeding while on Xarelto®. To help avoid this, consult your healthcare provider or pharmacist prior to using any new prescription or non-prescription  medications, including herbals, vitamins, non-steroidal anti-inflammatory drugs (NSAIDs) and supplements. ° °This website has more information on Xarelto®: www.xarelto.com. ° ° °

## 2017-08-05 NOTE — Progress Notes (Signed)
PROGRESS NOTE    [name]  RUE:[id number] DOB: [birth year] DOA: 08/04/2017 PCP: Fleet Contras, MD   Outpatient Specialists:     Brief Narrative:  [name] is a 56 y.o. male with a past medical history significant for MS wc bound on new anti-CD20, hx of DVT on Xarelto, HTN, and hep C recently treated who presents with weakness for 4 days.  The patient was in his usual state of health until over the weekend when he developed whole-body weakness. He is normally able to walk about half a block with a walker, but is usually wheelchair-bound. But over the weekend he's become so weak that he can barely stand, cannot walk 2 steps, and sometimes falls over because he cannot hold his body upon sitting up. There is no numbness, slurred speech. He's had neck pain and headache (although these are chronic), he has had abdominal pain, not worse with eating, position, or any other provoking factor. He's had some nausea and occasional vomiting, he has had cough, he has had frequent urination but no dysuria, he has had back pain but no fever. Of note, he recently last week right before symptoms started got the second of the first 2 infusions of ocrelizumab his anti-CD20 treatment.  He has not called his Neurologist about this new symptoms.   Assessment & Plan:   Principal Problem:   Hypokalemia Active Problems:   Multiple sclerosis (HCC)   DVT (deep venous thrombosis) (HCC)   Essential hypertension   Chronic cluster headache, not intractable   Chronic hepatitis C virus infection (HCC)   Chronic pain syndrome   Major depressive disorder, recurrent episode (HCC)   Weakness:   hypokalemia vs MS flare -MRI pending Doubt infection, and no focal symptoms/signs-- x ray of chest and U//A normal -Replaced Mag, K   Multiple sclersosis:  -Continue baclofen, dalfampridine, gabapentin -neurology consult appreciated-- MRI of brain/cervical spine pending  hypokalemia -replete to normal -not  an improvement in weakness -denies taking HCTZ (3 versions present on home med list), denies diarrhea, or decreased PO intake  Hypertension:  Blood pressure good  History of DVT:  -Continue Xarelto   DVT prophylaxis:  Fully anticoagulated   Code Status: Full Code   Family Communication:   Disposition Plan:     Consultants:   neuro   Subjective: C/o blurry vision--- similar to prior MS flares  Objective: Vitals:   08/05/17 0000 08/05/17 0139 08/05/17 0150 08/05/17 0453  BP: 128/82 117/60  (!) 104/59  Pulse: (!) 55 (!) 53  (!) 54  Resp: Temp:  (!) 97.5 F (36.4 C)  97.8 F (36.6 C)  TempSrc:  Oral  Oral  SpO2: 94% 97%  98%  Weight:   82 kg (180 lb 12.4 oz)   Height:    (1.753 m)     Intake/Output Summary (Last 24 hours) at 08/05/17 1234 Last data filed at 08/05/17 0900  Gross per 24 hour  Intake              680 ml  Output              550 ml  Net              130 ml   Filed Weights   08/05/17 0150  Weight: 82 kg (180 lb 12.4 oz)    Examination:  General exam: in bed, NAD Respiratory system: Clear to auscultation. Respiratory effort normal. Cardiovascular system: S1 & S2 heard, RRR.  No JVD, murmurs, rubs, gallops or clicks. No pedal edema. Gastrointestinal system: +Bs, soft, NT Central nervous system: Alert and oriented. No facial drooping Extremities: LE weaker than upper on exam Skin: No rashes, lesions or ulcers Psychiatry: Judgement and insight appear normal. Mood & affect appropriate.     Data Reviewed: I have personally reviewed following labs and imaging studies  CBC:  Recent Labs Lab 08/04/17 1822 08/05/17 0434  WBC 4.4 5.1  NEUTROABS 2.6  --   HGB 13.8 13.4  HCT 42.0 41.1  MCV 80.3 80.0  PLT 253 243   Basic Metabolic Panel:  Recent Labs Lab 08/04/17 1822 08/04/17 2120 08/05/17 0434  NA 140  --  138  K 2.7*  --  3.4*  CL 109  --  109  CO2 24  --  21*  GLUCOSE 114*  --  100*  BUN 6  --  5*    CREATININE 0.81  --  0.80  CALCIUM 8.7*  --  8.7*  MG  --  2.3  --    GFR: Estimated Creatinine Clearance: 103.1 mL/min (by C-G formula based on SCr of 0.8 mg/dL). Liver Function Tests:  Recent Labs Lab 08/04/17 1822  AST 15  ALT 17  ALKPHOS 81  BILITOT 0.7  PROT 6.7  ALBUMIN 3.6   No results for input(s): LIPASE, AMYLASE in the last 168 hours. No results for input(s): AMMONIA in the last 168 hours. Coagulation Profile: No results for input(s): INR, PROTIME in the last 168 hours. Cardiac Enzymes: No results for input(s): CKTOTAL, CKMB, CKMBINDEX, TROPONINI in the last 168 hours. BNP (last 3 results) No results for input(s): PROBNP in the last 8760 hours. HbA1C: No results for input(s): HGBA1C in the last 72 hours. CBG: No results for input(s): GLUCAP in the last 168 hours. Lipid Profile: No results for input(s): CHOL, HDL, LDLCALC, TRIG, CHOLHDL, LDLDIRECT in the last 72 hours. Thyroid Function Tests: No results for input(s): TSH, T4TOTAL, FREET4, T3FREE, THYROIDAB in the last 72 hours. Anemia Panel: No results for input(s): VITAMINB12, FOLATE, FERRITIN, TIBC, IRON, RETICCTPCT in the last 72 hours. Urine analysis:    Component Value Date/Time   COLORURINE YELLOW 08/05/2017 0123   APPEARANCEUR CLOUDY (A) 08/05/2017 0123   LABSPEC 1.011 08/05/2017 0123   PHURINE 7.0 08/05/2017 0123   GLUCOSEU NEGATIVE 08/05/2017 0123   HGBUR NEGATIVE 08/05/2017 0123   BILIRUBINUR NEGATIVE 08/05/2017 0123   KETONESUR NEGATIVE 08/05/2017 0123   PROTEINUR NEGATIVE 08/05/2017 0123   UROBILINOGEN 1.0 05/14/2014 2113   NITRITE NEGATIVE 08/05/2017 0123   LEUKOCYTESUR NEGATIVE 08/05/2017 0123      Recent Results (from the past 240 hour(s))  MRSA PCR Screening     Status: None   Collection Time: 08/05/17  7:05 AM  Result Value Ref Range Status   MRSA by PCR NEGATIVE NEGATIVE Final    Comment:        The GeneXpert MRSA Assay (FDA approved for NASAL specimens only), is one  component of a comprehensive MRSA colonization surveillance program. It is not intended to diagnose MRSA infection nor to guide or monitor treatment for MRSA infections.       Anti-infectives    None       Radiology Studies: Dg Chest 2 View  Result Date: 08/04/2017 CLINICAL DATA:  Generalized chest pain, dyspnea and dry cough. EXAM: CHEST  2 VIEW COMPARISON:  12/03/2016 FINDINGS: The heart size and mediastinal contours are within normal limits. Port catheter tip from right-sided IJ approach is  noted at the cavoatrial junction. Both lungs are clear. The visualized skeletal structures are unremarkable. IMPRESSION: No active cardiopulmonary disease. Electronically Signed   By: Tollie Eth M.D.   On: 08/04/2017 21:56        Scheduled Meds: . baclofen  20 mg Oral QID  . dalfampridine  10 mg Oral BID  . gabapentin  600 mg Oral QID  . levETIRAcetam  500 mg Oral BID  . Oxcarbazepine  300 mg Oral TID  . pantoprazole  40 mg Oral BID  . potassium chloride  40 mEq Oral BID  . rivaroxaban  10 mg Oral Daily  . sertraline  50 mg Oral Daily  . topiramate  25 mg Oral Q24H  . topiramate  50 mg Oral BID   Continuous Infusions:   LOS: 0 days    Time spent: 35 min    Renna Kilmer U Bellamie Turney, DO [facility] Pager [phone]  If 7PM-7AM, please contact night-coverage [email or web address] Password [facility] 08/05/2017, 12:34 PM

## 2017-08-05 NOTE — Progress Notes (Signed)
nif (-30) VC (375)  Strong effort

## 2017-08-05 NOTE — Progress Notes (Signed)
Pt admitted to [facility] for weakness and hypokalemia. On arrival pt is alert and orientedx4. Vss. Pt placed on Tele. Pt oriented to unit/equipment. WCTM

## 2017-08-06 ENCOUNTER — Observation Stay (HOSPITAL_COMMUNITY): Payer: Medicare Other

## 2017-08-06 DIAGNOSIS — D72829 Elevated white blood cell count, unspecified: Secondary | ICD-10-CM | POA: Diagnosis not present

## 2017-08-06 DIAGNOSIS — R1310 Dysphagia, unspecified: Secondary | ICD-10-CM | POA: Diagnosis present

## 2017-08-06 DIAGNOSIS — B182 Chronic viral hepatitis C: Secondary | ICD-10-CM | POA: Diagnosis present

## 2017-08-06 DIAGNOSIS — E860 Dehydration: Secondary | ICD-10-CM | POA: Diagnosis present

## 2017-08-06 DIAGNOSIS — I82499 Acute embolism and thrombosis of other specified deep vein of unspecified lower extremity: Secondary | ICD-10-CM | POA: Diagnosis not present

## 2017-08-06 DIAGNOSIS — R531 Weakness: Secondary | ICD-10-CM | POA: Diagnosis present

## 2017-08-06 DIAGNOSIS — G43109 Migraine with aura, not intractable, without status migrainosus: Secondary | ICD-10-CM | POA: Diagnosis present

## 2017-08-06 DIAGNOSIS — Z7901 Long term (current) use of anticoagulants: Secondary | ICD-10-CM | POA: Diagnosis not present

## 2017-08-06 DIAGNOSIS — G35 Multiple sclerosis: Secondary | ICD-10-CM | POA: Diagnosis present

## 2017-08-06 DIAGNOSIS — Z79891 Long term (current) use of opiate analgesic: Secondary | ICD-10-CM | POA: Diagnosis not present

## 2017-08-06 DIAGNOSIS — M1990 Unspecified osteoarthritis, unspecified site: Secondary | ICD-10-CM | POA: Diagnosis present

## 2017-08-06 DIAGNOSIS — E876 Hypokalemia: Secondary | ICD-10-CM | POA: Diagnosis present

## 2017-08-06 DIAGNOSIS — I10 Essential (primary) hypertension: Secondary | ICD-10-CM | POA: Diagnosis present

## 2017-08-06 DIAGNOSIS — G44029 Chronic cluster headache, not intractable: Secondary | ICD-10-CM | POA: Diagnosis present

## 2017-08-06 DIAGNOSIS — F331 Major depressive disorder, recurrent, moderate: Secondary | ICD-10-CM | POA: Diagnosis not present

## 2017-08-06 DIAGNOSIS — Z79899 Other long term (current) drug therapy: Secondary | ICD-10-CM | POA: Diagnosis not present

## 2017-08-06 DIAGNOSIS — R4781 Slurred speech: Secondary | ICD-10-CM | POA: Diagnosis present

## 2017-08-06 DIAGNOSIS — Z87891 Personal history of nicotine dependence: Secondary | ICD-10-CM | POA: Diagnosis not present

## 2017-08-06 DIAGNOSIS — G894 Chronic pain syndrome: Secondary | ICD-10-CM | POA: Diagnosis present

## 2017-08-06 DIAGNOSIS — T380X5A Adverse effect of glucocorticoids and synthetic analogues, initial encounter: Secondary | ICD-10-CM | POA: Diagnosis not present

## 2017-08-06 DIAGNOSIS — Z993 Dependence on wheelchair: Secondary | ICD-10-CM | POA: Diagnosis not present

## 2017-08-06 DIAGNOSIS — F419 Anxiety disorder, unspecified: Secondary | ICD-10-CM | POA: Diagnosis present

## 2017-08-06 DIAGNOSIS — F329 Major depressive disorder, single episode, unspecified: Secondary | ICD-10-CM | POA: Diagnosis present

## 2017-08-06 DIAGNOSIS — K219 Gastro-esophageal reflux disease without esophagitis: Secondary | ICD-10-CM | POA: Diagnosis present

## 2017-08-06 DIAGNOSIS — Z86718 Personal history of other venous thrombosis and embolism: Secondary | ICD-10-CM | POA: Diagnosis not present

## 2017-08-06 LAB — BASIC METABOLIC PANEL
ANION GAP: 5 (ref 5–15)
BUN: 6 mg/dL (ref 6–20)
CALCIUM: 8.7 mg/dL — AB (ref 8.9–10.3)
CO2: 21 mmol/L — ABNORMAL LOW (ref 22–32)
Chloride: 115 mmol/L — ABNORMAL HIGH (ref 101–111)
Creatinine, Ser: 0.83 mg/dL (ref 0.61–1.24)
GFR calc Af Amer: >60 mL/min (ref >60)
GLUCOSE: 102 mg/dL — AB (ref 65–99)
Potassium: 3.8 mmol/L (ref 3.5–5.1)
SODIUM: 141 mmol/L (ref 135–145)

## 2017-08-06 LAB — CBC
HCT: 38.4 % — ABNORMAL LOW (ref 39.0–52.0)
Hemoglobin: 12.5 g/dL — ABNORMAL LOW (ref 13.0–17.0)
MCH: 26.4 pg (ref 26.0–34.0)
MCHC: 32.6 g/dL (ref 30.0–36.0)
MCV: 81 fL (ref 78.0–100.0)
PLATELETS: 248 10*3/uL (ref 150–400)
RBC: 4.74 MIL/uL (ref 4.22–5.81)
RDW: 17 % — AB (ref 11.5–15.5)
WBC: 4.5 10*3/uL (ref 4.0–10.5)

## 2017-08-06 LAB — URINE CULTURE: CULTURE: NO GROWTH

## 2017-08-06 LAB — GLUCOSE, CAPILLARY: Glucose-Capillary: 113 mg/dL — ABNORMAL HIGH (ref 65–99)

## 2017-08-06 MED ORDER — DIPHENHYDRAMINE HCL 50 MG/ML IJ SOLN
12.5000 mg | Freq: Once | INTRAMUSCULAR | Status: AC
  Administered 2017-08-06: 12.5 mg via INTRAVENOUS
  Filled 2017-08-06: qty 1

## 2017-08-06 MED ORDER — SODIUM CHLORIDE 0.9% FLUSH
10.0000 mL | INTRAVENOUS | Status: DC | PRN
  Administered 2017-08-06: 10 mL
  Administered 2017-08-07: 20 mL
  Administered 2017-08-09: 10 mL
  Filled 2017-08-06 (×3): qty 40

## 2017-08-06 MED ORDER — KETOROLAC TROMETHAMINE 30 MG/ML IJ SOLN
30.0000 mg | Freq: Once | INTRAMUSCULAR | Status: AC
  Administered 2017-08-06: 30 mg via INTRAVENOUS
  Filled 2017-08-06: qty 1

## 2017-08-06 MED ORDER — GADOBENATE DIMEGLUMINE 529 MG/ML IV SOLN
15.0000 mL | Freq: Once | INTRAVENOUS | Status: AC | PRN
  Administered 2017-08-06: 15 mL via INTRAVENOUS

## 2017-08-06 MED ORDER — SODIUM CHLORIDE 0.9 % IV SOLN
1000.0000 mg | Freq: Every day | INTRAVENOUS | Status: AC
  Administered 2017-08-06 – 2017-08-08 (×3): 1000 mg via INTRAVENOUS
  Filled 2017-08-06 (×3): qty 8

## 2017-08-06 MED ORDER — PROCHLORPERAZINE EDISYLATE 5 MG/ML IJ SOLN
10.0000 mg | Freq: Once | INTRAMUSCULAR | Status: AC
  Administered 2017-08-06: 10 mg via INTRAVENOUS
  Filled 2017-08-06: qty 2

## 2017-08-06 NOTE — Progress Notes (Signed)
Patient resting well at this time NIF and VC not completed will continue 9/28.  RT will continue to monitor

## 2017-08-06 NOTE — Progress Notes (Signed)
PROGRESS NOTE    Sirron Francesconi  RUE:[id number] DOB: 04-11-1961 DOA: 08/04/2017 PCP: Fleet Contras, MD   Brief Narrative:  [name] a 56 y.o.malewith a past medical history significant for MS wc bound on new anti-CD20, hx of DVT on Xarelto, HTN, and hep C recently treatedwho presents with weakness for 4 days.  The patient was in his usual state of health until over the weekend when he developed whole-body weakness. He is normally able to walk about half a block with a walker, but is usually wheelchair-bound. But over the weekend he's become so weak that he can barely stand, cannot walk 2 steps, and sometimes falls over because he cannot hold his body upon sitting up. There is no numbness, slurred speech. He's had neck pain and headache (although these are chronic), he has had abdominal pain, not worse with eating, position, or any other provoking factor. He's had some nausea and occasional vomiting, he has had cough, he has had frequent urination but no dysuria, he has had back pain but no fever. Of note, he recently last week right before symptoms started got the second of the first 2 infusions of ocrelizumab his anti-CD20 treatment. He has not called his Neurologist about this new symptoms. Was admitted for LE weakness especially Right Leg and Neurology consulted and ordering T-Spine and L-Spine for unilateral Right Leg weakness and starting the patient on IV Solumedrol 1 gram Daily x 3.  Assessment & Plan:   Principal Problem:   Hypokalemia Active Problems:   Multiple sclerosis (HCC)   DVT (deep venous thrombosis) (HCC)   Essential hypertension   Chronic cluster headache, not intractable   Chronic hepatitis C virus infection (HCC)   Chronic pain syndrome   Major depressive disorder, recurrent episode (HCC)  Lower Extremity Weakness especially in Right Leg: -Hypokalemia vs MS flare; Neurology Empirically starting Solumedrol 1 gram daily x 3 days as patient continues to  feel weak -MRI of Brain and C-Spine showed Relatively mild chronic cerebral white matter signal abnormality is stable, with no evidence of acute or progressive intracranial demyelination. 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 No new intracranial abnormality. Chronic C3-C4 and C5-C6 spinal degeneration is stable since 2013 -Doubt infection, and no focal symptoms/signs-- x ray of chest and U//A normal -Replaced Mag, K -K+ Corrected and continues to have weakness -Neurology ordering MRI T-Spine and L-Spine   Multiple Sclersosis: -Continue Baclofen 20 mg po QID, Dalfampridine 10 mg po BID, Gabapentin 600 mg QID -Neurology consult appreciated-- MRI of brain/cervical spine as above -MRI T-Spine and L-Spine ordered and Neurology starting Empiric High dose Steroids 1 g IV Daily x 3 days -Holding Ocrelizumab   Hypokalemia, improved -Replete to normal and now is 3.8 -Not an improvement in weakness -Denies taking HCTZ (3 versions present on home med list), denies diarrhea, or decreased PO intake  Hypertension -Stable. BP Controlled without Medication but has several Antihypertensives on MAR including HCTZ, Lisinopril-HCTZ, Triamterene-HCTZ, and Verapamil -Continue to Monitor off Medication  History of DVT -Continue Rivaroxaban 10 mg po Daily  Depression -C/w Sertraline 50 mg po Daily  Hepatitis C -Recently Treated  Hx of Cluster Headaches -C/w Topomax  DVT prophylaxis: Anticoagulated with Rivaroxaban  Code Status: FULL CODE Family Communication: No family present at bedside Disposition Plan: Anticipate D/C Home with Home Health when cleared by Neurology   Consultants:   Neurology   Procedures: None   Antimicrobials:  Anti-infectives  None     Subjective: Seen and examined and still felt weak especially in Right Leg. No CP or SOB. No other concerns or complaints  at this time.   Objective: Vitals:   08/05/17 0453 08/05/17 1328 08/05/17 2117 08/06/17 0628  BP: (!) 104/59 107/71 108/66 122/77  Pulse: (!) 54 63 60 (!) 55  Resp: Temp: 97.8 F (36.6 C) 97.9 F (36.6 C) 98.4 F (36.9 C) (!) 97.5 F (36.4 C)  TempSrc: Oral Oral Oral Oral  SpO2: 98% 95% 93% 94%  Weight:      Height:        Intake/Output Summary (Last 24 hours) at 08/06/17 0759 Last data filed at 08/06/17 1610  Gross per 24 hour  Intake              490 ml  Output                0 ml  Net              490 ml   Filed Weights   08/05/17 0150  Weight: 82 kg (180 lb 12.4 oz)    Examination: Physical Exam:  Constitutional: WN/WD sitting up in bed in NAD and appears calm and comfortable Eyes: Lids and conjunctivae normal, sclerae anicteric  ENMT: External Ears, Nose appear normal. Grossly normal hearing. Mucous membranes are moist  Neck: Appears normal, supple, no cervical masses, normal ROM, no appreciable thyromegaly, no JVD Respiratory: Clear to auscultation bilaterally, no wheezing, rales, rhonchi or crackles. Normal respiratory effort and patient is not tachypenic. No accessory muscle use.  Cardiovascular: RRR, no murmurs / rubs / gallops. S1 and S2 auscultated. No extremity edema.  Abdomen: Soft, non-tender, non-distended. No masses palpated. No appreciable hepatosplenomegaly. Bowel sounds positive.  GU: Deferred. Musculoskeletal: No clubbing / cyanosis of digits/nails. No joint deformity upper and lower extremities.  Skin: No rashes, lesions, ulcers on a limited skin eval. No induration; Warm and dry.  Neurologic: CN 2-12 grossly intact with no focal deficits. Right Leg and Right Arm weaker than left side. Right leg especially weak and cannot plantar flex foot. Psychiatric: Normal judgment and insight. Alert and oriented x 3. Normal mood and appropriate affect.   Data Reviewed: I have personally reviewed following labs and imaging studies  CBC:  Recent  Labs Lab 08/04/17 1822 08/05/17 0434 08/06/17 0615  WBC 4.4 5.1 4.5  NEUTROABS 2.6  --   --   HGB 13.8 13.4 12.5*  HCT 42.0 41.1 38.4*  MCV 80.3 80.0 81.0  PLT 253 243 248   Basic Metabolic Panel:  Recent Labs Lab 08/04/17 1822 08/04/17 2120 08/05/17 0434 08/06/17 0615  NA 140  --  138 141  K 2.7*  --  3.4* 3.8  CL 109  --  109 115*  CO2 24  --  21* 21*  GLUCOSE 114*  --  100* 102*  BUN 6  --  5* 6  CREATININE 0.81  --  0.80 0.83  CALCIUM 8.7*  --  8.7* 8.7*  MG  --  2.3  --   --    GFR: Estimated Creatinine Clearance: 99.4 mL/min (by C-G formula based on SCr of 0.83 mg/dL). Liver Function Tests:  Recent Labs Lab 08/04/17 1822  AST 15  ALT 17  ALKPHOS 81  BILITOT 0.7  PROT 6.7  ALBUMIN 3.6   No results for input(s): LIPASE, AMYLASE in the last 168 hours. No results for input(s): AMMONIA in  the last 168 hours. Coagulation Profile: No results for input(s): INR, PROTIME in the last 168 hours. Cardiac Enzymes: No results for input(s): CKTOTAL, CKMB, CKMBINDEX, TROPONINI in the last 168 hours. BNP (last 3 results) No results for input(s): PROBNP in the last 8760 hours. HbA1C: No results for input(s): HGBA1C in the last 72 hours. CBG:  Recent Labs Lab 08/06/17 0752  GLUCAP 113*   Lipid Profile: No results for input(s): CHOL, HDL, LDLCALC, TRIG, CHOLHDL, LDLDIRECT in the last 72 hours. Thyroid Function Tests: No results for input(s): TSH, T4TOTAL, FREET4, T3FREE, THYROIDAB in the last 72 hours. Anemia Panel: No results for input(s): VITAMINB12, FOLATE, FERRITIN, TIBC, IRON, RETICCTPCT in the last 72 hours. Sepsis Labs: No results for input(s): PROCALCITON, LATICACIDVEN in the last 168 hours.  Recent Results (from the past 240 hour(s))  Culture, blood (routine x 2)     Status: None (Preliminary result)   Collection Time: 08/04/17  9:20 PM  Result Value Ref Range Status   Specimen Description BLOOD RIGHT ANTECUBITAL  Final   Special Requests   Final      BOTTLES DRAWN AEROBIC AND ANAEROBIC Blood Culture adequate volume   Culture NO GROWTH < 24 HOURS  Final   Report Status PENDING  Incomplete  Culture, blood (routine x 2)     Status: None (Preliminary result)   Collection Time: 08/04/17  9:27 PM  Result Value Ref Range Status   Specimen Description BLOOD RIGHT HAND  Final   Special Requests   Final    BOTTLES DRAWN AEROBIC AND ANAEROBIC Blood Culture adequate volume   Culture NO GROWTH < 24 HOURS  Final   Report Status PENDING  Incomplete  MRSA PCR Screening     Status: None   Collection Time: 08/05/17  7:05 AM  Result Value Ref Range Status   MRSA by PCR NEGATIVE NEGATIVE Final    Comment:        The GeneXpert MRSA Assay (FDA approved for NASAL specimens only), is one component of a comprehensive MRSA colonization surveillance program. It is not intended to diagnose MRSA infection nor to guide or monitor treatment for MRSA infections.     Radiology Studies: Dg Chest 2 View  Result Date: 08/04/2017 CLINICAL DATA:  Generalized chest pain, dyspnea and dry cough. EXAM: CHEST  2 VIEW COMPARISON:  12/03/2016 FINDINGS: The heart size and mediastinal contours are within normal limits. Port catheter tip from right-sided IJ approach is noted at the cavoatrial junction. Both lungs are clear. The visualized skeletal structures are unremarkable. IMPRESSION: No active cardiopulmonary disease. Electronically Signed   By: Tollie Eth M.D.   On: 08/04/2017 21:56   Scheduled Meds: . baclofen  20 mg Oral QID  . dalfampridine  10 mg Oral BID  . gabapentin  600 mg Oral QID  . levETIRAcetam  500 mg Oral BID  . Oxcarbazepine  300 mg Oral TID  . pantoprazole  40 mg Oral BID  . potassium chloride  40 mEq Oral BID  . rivaroxaban  10 mg Oral Daily  . sertraline  50 mg Oral Daily  . topiramate  25 mg Oral Q24H  . topiramate  50 mg Oral BID   Continuous Infusions:   LOS: 0 days   Merlene Laughter, DO [facility] Pager  [phone]  If 7PM-7AM, please contact night-coverage [email or web address] Password [email or web address] 08/06/2017, 7:59 AM

## 2017-08-06 NOTE — Progress Notes (Signed)
Subjective: Continues to have LE weakness right greater than left.  He states this is very abnormal for him.   Exam: Vitals:   08/05/17 2117 08/06/17 0628  BP: 108/66 122/77  Pulse: 60 (!) 55  Resp: 18 18  Temp: 98.4 F (36.9 C) (!) 97.5 F (36.4 C)  SpO2: 93% 94%    HEENT-  Normocephalic, no lesions, without obvious abnormality.  Normal external eye and conjunctiva.  Normal TM's bilaterally.  Normal auditory canals and external ears. Normal external nose, mucus membranes and septum.  Normal pharynx.  Neuro:alert oriented and able to follow commands AC:ZYSAYT are equal and round. They are symmetrically reactive from 3-->2 mm. EOMI without nystagmus. Facial sensation is intact to light touch. Face is symmetric at rest with normal strength and mobility. Hearing is intact to conversational voice. Palate elevates symmetrically and uvula is midline. Voice is normal in tone, pitch and quality. Bilateral SCM and trapezii are 5/5. Tongue is midline with normal bulk and mobility.  Motor:upper extremity 5/5 throughout. Increased motor tone of bilateral lower extremities, right worse than left--holds right leg outstretched and I cannot bend his leg at the knee. Left dorsi flexion and plantar flexion 5/5 right dorsiflexion plantar flexion 4/5. No Hoover's sign Sensation: Intact to light touch.  DTRs:2+, symmetric  Diminished reflexes in the lower extremities      Pertinent Labs/Diagnostics: MRI brain and cervical spine did not show any enhancing lesions.   Felicie Morn PA-C [facility] [phone]  Today, he states that he has a right posterior headache which is throbbing in character associated with photophobia. He has waxing/waning wavy lines in his visual fields.  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 Patient has significant weakness at baseline and likely worsened by dehydraton and hypokalemia.   Continues to feel very weak in the  lower extremities. There he has no acute inflammation present on MRI, I do wonder about MS contributing to his current presentation and therefore I have started IV Solu-Medrol.  Given the description of the headaches, and aura associated with some I do suspect that he may be having a migraine. Headaches are associated with Ocrelizumab, though I am not familiar enough with it to know if migraines after infusions are common. I suspect that there is a component of, due to migraine as well at play here and therefore we will treat for migraine as well.  Recommendations: 1) MRI thoracic and lumbar spine with and without contrast --ordered 2) start Solumedrol 1 gram daily for 3 doses--ordered 3) Compazine, Benadryl, Toradol for migraine  Ritta Slot, MD [facility] [phone]  If 7pm- 7am, please page neurology on call as listed in AMION.  08/06/2017, 11:06 AM

## 2017-08-06 NOTE — Progress Notes (Signed)
NIF -30. VC 2.57 L.

## 2017-08-06 NOTE — Progress Notes (Signed)
PT Cancellation Note  Patient Details Name: [name] MRN: [id number] DOB: 10/29/1961   Cancelled Treatment:    Reason Eval/Treat Not Completed: Patient at procedure or test/unavailable.  Pt was in MRI on arrival.  Will see 9/28 as able. 08/06/2017  [place] Bing, PT [phone]  (pager)   Eliseo Gum Ramani Riva 08/06/2017, 5:47 PM

## 2017-08-07 LAB — COMPREHENSIVE METABOLIC PANEL
ALT: 14 U/L — ABNORMAL LOW (ref 17–63)
AST: 18 U/L (ref 15–41)
Albumin: 3.3 g/dL — ABNORMAL LOW (ref 3.5–5.0)
Alkaline Phosphatase: 70 U/L (ref 38–126)
Anion gap: 6 (ref 5–15)
BUN: 11 mg/dL (ref 6–20)
CHLORIDE: 115 mmol/L — AB (ref 101–111)
CO2: 20 mmol/L — AB (ref 22–32)
Calcium: 9 mg/dL (ref 8.9–10.3)
Creatinine, Ser: 0.93 mg/dL (ref 0.61–1.24)
GFR calc Af Amer: >60 mL/min (ref >60)
Glucose, Bld: 160 mg/dL — ABNORMAL HIGH (ref 65–99)
Potassium: 4.3 mmol/L (ref 3.5–5.1)
Sodium: 141 mmol/L (ref 135–145)
TOTAL PROTEIN: 6.1 g/dL — AB (ref 6.5–8.1)
Total Bilirubin: 0.3 mg/dL (ref 0.3–1.2)

## 2017-08-07 LAB — CBC WITH DIFFERENTIAL/PLATELET
BASOS ABS: 0 10*3/uL (ref 0.0–0.1)
Basophils Relative: 0 %
EOS PCT: 0 %
Eosinophils Absolute: 0 10*3/uL (ref 0.0–0.7)
HCT: 40.8 % (ref 39.0–52.0)
Hemoglobin: 13 g/dL (ref 13.0–17.0)
LYMPHS PCT: 9 %
Lymphs Abs: 0.5 10*3/uL — ABNORMAL LOW (ref 0.7–4.0)
MCH: 25.8 pg — ABNORMAL LOW (ref 26.0–34.0)
MCHC: 31.9 g/dL (ref 30.0–36.0)
MCV: 81.1 fL (ref 78.0–100.0)
Monocytes Absolute: 0 10*3/uL — ABNORMAL LOW (ref 0.1–1.0)
Monocytes Relative: 0 %
NEUTROS ABS: 5.3 10*3/uL (ref 1.7–7.7)
NEUTROS PCT: 91 %
PLATELETS: 241 10*3/uL (ref 150–400)
RBC: 5.03 MIL/uL (ref 4.22–5.81)
RDW: 16.7 % — ABNORMAL HIGH (ref 11.5–15.5)
WBC: 5.8 10*3/uL (ref 4.0–10.5)

## 2017-08-07 LAB — MAGNESIUM: MAGNESIUM: 2 mg/dL (ref 1.7–2.4)

## 2017-08-07 LAB — PHOSPHORUS: PHOSPHORUS: 1.7 mg/dL — AB (ref 2.5–4.6)

## 2017-08-07 MED ORDER — POTASSIUM PHOSPHATES 15 MMOLE/5ML IV SOLN
20.0000 mmol | Freq: Once | INTRAVENOUS | Status: AC
  Administered 2017-08-07: 20 mmol via INTRAVENOUS
  Filled 2017-08-07: qty 6.67

## 2017-08-07 MED ORDER — KETOROLAC TROMETHAMINE 30 MG/ML IJ SOLN
30.0000 mg | Freq: Once | INTRAMUSCULAR | Status: AC
  Administered 2017-08-07: 30 mg via INTRAVENOUS
  Filled 2017-08-07: qty 1

## 2017-08-07 MED ORDER — DIPHENHYDRAMINE HCL 50 MG/ML IJ SOLN
12.5000 mg | Freq: Two times a day (BID) | INTRAMUSCULAR | Status: DC
  Administered 2017-08-07: 12.5 mg via INTRAVENOUS
  Filled 2017-08-07: qty 1

## 2017-08-07 MED ORDER — PROCHLORPERAZINE EDISYLATE 5 MG/ML IJ SOLN
10.0000 mg | Freq: Four times a day (QID) | INTRAMUSCULAR | Status: DC
  Administered 2017-08-07 – 2017-08-08 (×3): 10 mg via INTRAVENOUS
  Filled 2017-08-07 (×3): qty 2

## 2017-08-07 MED ORDER — VALPROATE SODIUM 500 MG/5ML IV SOLN
1000.0000 mg | Freq: Once | INTRAVENOUS | Status: AC
  Administered 2017-08-07: 1000 mg via INTRAVENOUS
  Filled 2017-08-07: qty 10

## 2017-08-07 NOTE — Progress Notes (Signed)
Physical Therapy Treatment Patient Details Name: [name] MRN: [id number] DOB: July 05, 1961 Today's Date: 08/07/2017    History of Present Illness [name] is a 56 y.o. male with a past medical history significant for MS, being w/c bound on new anti-CD20, hx of DVT on Xarelto, HTN, and hep C (treated), who presents with weakness for 4 days.  Work up includes MS Flare, MRI pending.    PT Comments    Progressing steadily.  Pt getting upset/agitated during session relaying that he is being pushed to fast even though he is asked to go at his own pace.  Emphasis on getting to EOB, scooting, sitting balance, squat-pivot transfers and stadning tolerance/pre-gait.   Follow Up Recommendations  Home health PT     Equipment Recommendations  None recommended by PT    Recommendations for Other Services       Precautions / Restrictions Precautions Precautions: Fall    Mobility  Bed Mobility Overal bed mobility: Needs Assistance Bed Mobility: Supine to Sit     Supine to sit: HOB elevated;Min assist     General bed mobility comments: pt used UE assist to pivot and scoot to EOB  Transfers Overall transfer level: Needs assistance Equipment used: Rolling walker (2 wheeled) Transfers: Sit to/from [employer] Sit to Stand: Mod assist   Squat pivot transfers: Min guard     General transfer comment: pt stayed in a squat position, but used good technique to pivot without assist to chair.  Ambulation/Gait             General Gait Details: pt ambulated 2 feet forward and back with mod assist and heavy use of the RW.  Step length about 2 inches per step.   Stairs            Wheelchair Mobility    Modified Rankin (Stroke Patients Only)       Balance Overall balance assessment: Needs assistance   Sitting balance-Leahy Scale: Fair       Standing balance-Leahy Scale: Poor Standing balance comment: heavy use of the RW                             Cognition Arousal/Alertness: Awake/alert Behavior During Therapy: Agitated;WFL for tasks assessed/performed Overall Cognitive Status: No family/caregiver present to determine baseline cognitive functioning                                        Exercises      General Comments        Pertinent Vitals/Pain Faces Pain Scale: Hurts even more Pain Location: HA, otherwise vague    Home Living                      Prior Function            PT Goals (current goals can now be found in the care plan section) Acute Rehab PT Goals Patient Stated Goal: figure out why I;m so weak and why I dizzy. PT Goal Formulation: With patient Time For Goal Achievement: 08/19/17 Potential to Achieve Goals: Fair Progress towards PT goals: Progressing toward goals    Frequency    Min 3X/week      PT Plan Current plan remains appropriate    Co-evaluation  AM-PAC PT "6 Clicks" Daily Activity  Outcome Measure  Difficulty turning over in bed (including adjusting bedclothes, sheets and blankets)?: Unable Difficulty moving from lying on back to sitting on the side of the bed? : Unable Difficulty sitting down on and standing up from a chair with arms (e.g., wheelchair, bedside commode, etc,.)?: Unable Help needed moving to and from a bed to chair (including a wheelchair)?: A Little Help needed walking in hospital room?: A Lot Help needed climbing 3-5 steps with a railing? : A Lot 6 Click Score: 10    End of Session   Activity Tolerance: Patient tolerated treatment well Patient left: in chair;with call bell/phone within reach;with chair alarm set Nurse Communication: Mobility status PT Visit Diagnosis: Muscle weakness (generalized) (M62.81);Other symptoms and signs involving the nervous system (R29.898);Pain Pain - part of body:  (head)     Time: 1191-4782 PT Time Calculation (min) (ACUTE ONLY): 30 min  Charges:  $Therapeutic  Activity: 23-37 mins                    G Codes:       09-06-2017  [name], PT [phone]  (pager)   [name] September 06, 2017, 1:58 PM

## 2017-08-07 NOTE — Progress Notes (Signed)
PROGRESS NOTE    [name]  ZOX:[id number] DOB: 1961/10/13 DOA: 08/04/2017 PCP: Fleet Contras, MD   Brief Narrative:  [name] a 56 y.o.malewith a past medical history significant for MS wc bound on new anti-CD20, hx of DVT on Xarelto, HTN, and hep C recently treatedwho presents with weakness for 4 days.  The patient was in his usual state of health until over the weekend when he developed whole-body weakness. He is normally able to walk about half a block with a walker, but is usually wheelchair-bound. But over the weekend he's become so weak that he can barely stand, cannot walk 2 steps, and sometimes falls over because he cannot hold his body upon sitting up. There is no numbness, slurred speech. He's had neck pain and headache (although these are chronic), he has had abdominal pain, not worse with eating, position, or any other provoking factor. He's had some nausea and occasional vomiting, he has had cough, he has had frequent urination but no dysuria, he has had back pain but no fever. Of note, he recently last week right before symptoms started got the second of the first 2 infusions of ocrelizumab his anti-CD20 treatment. He has not called his Neurologist about this new symptoms. Was admitted for LE weakness especially Right Leg and Neurology consulted and ordering T-Spine and L-Spine for unilateral Right Leg weakness and starting the patient on IV Solumedrol 1 gram Daily x 3 (Day 2 of 3 today).MRI still to be done.   Assessment & Plan:   Principal Problem:   Hypokalemia Active Problems:   Multiple sclerosis (HCC)   DVT (deep venous thrombosis) (HCC)   Essential hypertension   Chronic cluster headache, not intractable   Chronic hepatitis C virus infection (HCC)   Chronic pain syndrome   Major depressive disorder, recurrent episode (HCC)  Lower Extremity Weakness especially in Right Leg: -Hypokalemia vs MS flare; Neurology Empirically starting Solumedrol 1 gram  daily x 3 days as patient continues to feel weak; Day 2/3 today -MRI of Brain and C-Spine showed Relatively mild chronic cerebral white matter signal abnormality is stable, with no evidence of acute or progressive intracranial demyelination. 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 No new intracranial abnormality. Chronic C3-C4 and C5-C6 spinal degeneration is stable since 2013 -Doubt infection, and no focal symptoms/signs-- x ray of chest and U//A normal -Replaced Mag, K -K+ Corrected and continues to have weakness -Neurology ordering MRI T-Spine and L-Spine and still pending to be Done   Relapsing/Remitting Multiple Sclersosis: -Continue Baclofen 20 mg po QID, Dalfampridine 10 mg po BID, Gabapentin 600 mg QID -Neurology consult appreciated-- MRI of brain/cervical spine as above -MRI T-Spine and L-Spine ordered and Neurology starting Empiric High dose Steroids 1 g IV Daily x 3 days (Day 2/3) -Holding Ocrelizumab  -Appreciate Neuro Recc's  Hypokalemia, improved -Replete to normal and now is 4.3 -Not an improvement in weakness -Denies taking HCTZ (3 versions present on home med list), denies diarrhea, or decreased PO intake  Hypertension -Stable. BP Controlled without Medication but has several Antihypertensives on MAR including HCTZ, Lisinopril-HCTZ, Triamterene-HCTZ, and Verapamil -Continue to Monitor off Medication  History of DVT -Continue Rivaroxaban 10 mg po Daily  Depression -C/w Sertraline 50 mg po Daily  Hepatitis C -Recently Treated  Hx of Cluster Headaches/Migraine -C/w Topomax -Stated he had a headache with visual disturbances and likely was a migraine -Given Ketorolac overnight  -Continue to Monitor  DVT prophylaxis: Anticoagulated with Rivaroxaban  Code Status: FULL CODE Family Communication: No family present at bedside Disposition Plan: Anticipate D/C  Home with Home Health when cleared by Neurology   Consultants:   Neurology   Procedures: None   Antimicrobials:  Anti-infectives    None     Subjective: Seen and examined and still felt weak but felt as if he was improving. Complained of a headache with visual disturbances and wavy lines. No CP or SOB. No other concerns or complaints at this time.   Objective: Vitals:   08/06/17 1559 08/06/17 2202 08/07/17 0618 08/07/17 1433  BP: 129/89 98/61 135/80 110/63  Pulse: 65 64 (!) 55 76  Resp: Temp: 98.1 F (36.7 C) 98.2 F (36.8 C) 97.7 F (36.5 C) 98.2 F (36.8 C)  TempSrc: Oral  Oral Oral  SpO2: 95% 92% 95% 96%  Weight:      Height:        Intake/Output Summary (Last 24 hours) at 08/07/17 1548 Last data filed at 08/07/17 1524  Gross per 24 hour  Intake           842.67 ml  Output              400 ml  Net           442.67 ml   Filed Weights   08/05/17 0150  Weight: 82 kg (180 lb 12.4 oz)    Examination: Physical Exam:  Constitutional: WN/WD obese AAM in NAD appears calm and comfortable  Eyes: Sclerae anicteric; Lids normal ENMT: External ears and nose appear normal. MMM Neck: Supple with no JVD Respiratory: CTAB; No wheezing/rales/rhonchi.  Cardiovascular: RRR, no m/r/g. No appreciable extremity edema Abdomen: Soft, NT, ND. Bowel sounds present GU: Deferred Musculoskeletal: No contractures; No cyanosis. Skin: No rashes or lesions on a limited skin eval Neurologic: Stutters some. CN 2-12 grossly intact. Right side weaker than left and Left Leg severe weakness Psychiatric: Normal mood and affect. Intact judgement and insight  Data Reviewed: I have personally reviewed following labs and imaging studies  CBC:  Recent Labs Lab 08/04/17 1822 08/05/17 0434 08/06/17 0615 08/07/17 0448  WBC 4.4 5.1 4.5 5.8  NEUTROABS 2.6  --   --  5.3  HGB 13.8 13.4 12.5* 13.0  HCT 42.0 41.1 38.4* 40.8  MCV 80.3 80.0 81.0 81.1  PLT 253 243 248 241   Basic  Metabolic Panel:  Recent Labs Lab 08/04/17 1822 08/04/17 2120 08/05/17 0434 08/06/17 0615 08/07/17 0448  NA 140  --  138 141 141  K 2.7*  --  3.4* 3.8 4.3  CL 109  --  109 115* 115*  CO2 24  --  21* 21* 20*  GLUCOSE 114*  --  100* 102* 160*  BUN 6  --  5* 6 11  CREATININE 0.81  --  0.80 0.83 0.93  CALCIUM 8.7*  --  8.7* 8.7* 9.0  MG  --  2.3  --   --  2.0  PHOS  --   --   --   --  1.7*   GFR: Estimated Creatinine Clearance: 88.7 mL/min (by C-G formula based on SCr of 0.93 mg/dL). Liver Function Tests:  Recent Labs Lab 08/04/17 1822 08/07/17 0448  AST 15 18  ALT 17 14*  ALKPHOS 81 70  BILITOT 0.7 0.3  PROT 6.7 6.1*  ALBUMIN 3.6 3.3*   No results for input(s): LIPASE, AMYLASE in the last 168 hours. No results  for input(s): AMMONIA in the last 168 hours. Coagulation Profile: No results for input(s): INR, PROTIME in the last 168 hours. Cardiac Enzymes: No results for input(s): CKTOTAL, CKMB, CKMBINDEX, TROPONINI in the last 168 hours. BNP (last 3 results) No results for input(s): PROBNP in the last 8760 hours. HbA1C: No results for input(s): HGBA1C in the last 72 hours. CBG:  Recent Labs Lab 08/06/17 0752  GLUCAP 113*   Lipid Profile: No results for input(s): CHOL, HDL, LDLCALC, TRIG, CHOLHDL, LDLDIRECT in the last 72 hours. Thyroid Function Tests: No results for input(s): TSH, T4TOTAL, FREET4, T3FREE, THYROIDAB in the last 72 hours. Anemia Panel: No results for input(s): VITAMINB12, FOLATE, FERRITIN, TIBC, IRON, RETICCTPCT in the last 72 hours. Sepsis Labs: No results for input(s): PROCALCITON, LATICACIDVEN in the last 168 hours.  Recent Results (from the past 240 hour(s))  Culture, Urine     Status: None   Collection Time: 08/04/17  1:23 AM  Result Value Ref Range Status   Specimen Description URINE, CLEAN CATCH  Final   Special Requests NONE  Final   Culture NO GROWTH  Final   Report Status 08/06/2017 FINAL  Final  Culture, blood (routine x 2)      Status: None (Preliminary result)   Collection Time: 08/04/17  9:20 PM  Result Value Ref Range Status   Specimen Description BLOOD RIGHT ANTECUBITAL  Final   Special Requests   Final    BOTTLES DRAWN AEROBIC AND ANAEROBIC Blood Culture adequate volume   Culture NO GROWTH 3 DAYS  Final   Report Status PENDING  Incomplete  Culture, blood (routine x 2)     Status: None (Preliminary result)   Collection Time: 08/04/17  9:27 PM  Result Value Ref Range Status   Specimen Description BLOOD RIGHT HAND  Final   Special Requests   Final    BOTTLES DRAWN AEROBIC AND ANAEROBIC Blood Culture adequate volume   Culture NO GROWTH 3 DAYS  Final   Report Status PENDING  Incomplete  MRSA PCR Screening     Status: None   Collection Time: 08/05/17  7:05 AM  Result Value Ref Range Status   MRSA by PCR NEGATIVE NEGATIVE Final    Comment:        The GeneXpert MRSA Assay (FDA approved for NASAL specimens only), is one component of a comprehensive MRSA colonization surveillance program. It is not intended to diagnose MRSA infection nor to guide or monitor treatment for MRSA infections.     Radiology Studies: Mr Laqueta Jean Wo Contrast  Result Date: 08/06/2017 CLINICAL DATA:  56 year old male with chronic multiple sclerosis. Recent increased whole-body weakness. EXAM: MRI HEAD WITHOUT AND WITH CONTRAST MRI CERVICAL SPINE WITHOUT AND WITH CONTRAST TECHNIQUE: Multiplanar, multiecho pulse sequences of the brain and surrounding structures, and cervical spine, to include the craniocervical junction and cervicothoracic junction, were obtained without and with intravenous contrast. CONTRAST:  15mL MULTIHANCE GADOBENATE DIMEGLUMINE 529 MG/ML IV SOLN COMPARISON:  Head CT without contrast 05/07/2017. Brain MRI 03/18/2017, cervical spine MRI 10/04/2014 and 03/31/2012 FINDINGS: MRI HEAD FINDINGS Brain: Stable cerebral volume. No restricted diffusion to suggest acute infarction. No midline shift, mass effect, evidence of mass  lesion, ventriculomegaly, extra-axial collection or acute intracranial hemorrhage. Cervicomedullary junction and pituitary are within normal limits. Cerebral white matter signal abnormality in the form of scattered small foci, without a periventricular prep collection. Frontal lobe central and subcortical white matter appears most affected. Lesser temporal lobe involvement, mostly on the left. No corpus callosum involvement.  No cortical involvement or cortical encephalomalacia identified. Bilateral deep gray matter nuclei, brainstem, and cerebellum remain spared. No progression since May. No abnormal enhancement identified. No restricted lesions. Vascular: Major intracranial vascular flow voids are stable. Skull and upper cervical spine: Normal bone marrow signal. Sinuses/Orbits: Stable orbits soft tissues. Paranasal sinuses and mastoids are stable and well pneumatized. Other: Visible internal auditory structures appear normal. MRI CERVICAL SPINE FINDINGS Alignment: Straightening of cervical lordosis. Vertebrae: No marrow edema or evidence of acute osseous abnormality. Cord: Due to significant recurrent motion artifact on cervical spine imaging since 2015, spinal cord lesions today are compared to 2013. Furthermore, the axial T2 and T2* imaging today is degraded by motion despite repeated imaging attempts. Significant STIR heterogeneity throughout the cervical spinal cord with patchy hyperintense cord lesions at the dorsal C2-C3 level, left dorsal C4-C5 level, right dorsal C6 level, and ventral C6-C7 level. See series 16, image 8. This appears progressed since 2013, although patchy cord heterogeneity was present at that time. There is no associated spinal cord enhancement or abnormal intradural enhancement or dural thickening. Posterior Fossa, vertebral arteries, paraspinal tissues: Preserved vascular flow voids in the neck. Negative neck soft tissues. Disc levels: Chronic disc and endplate degeneration at C3-C4 and  C5-C6 with up to mild spinal stenosis at both levels. Chronic moderate to severe bilateral C4 neural foraminal stenosis. Chronic moderate left C6 neural foraminal stenosis. Cervical spine degeneration has not significantly progressed since 2013. IMPRESSION: 1. Relatively mild chronic cerebral white matter signal abnormality is stable, with no evidence of acute or progressive intracranial demyelination. 2. 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 3. No new intracranial abnormality. 4. Chronic C3-C4 and C5-C6 spinal degeneration is stable since 2013. Electronically Signed   By: Odessa Fleming M.D.   On: 08/06/2017 09:01   Mr Cervical Spine W Wo Contrast  Result Date: 08/06/2017 CLINICAL DATA:  56 year old male with chronic multiple sclerosis. Recent increased whole-body weakness. EXAM: MRI HEAD WITHOUT AND WITH CONTRAST MRI CERVICAL SPINE WITHOUT AND WITH CONTRAST TECHNIQUE: Multiplanar, multiecho pulse sequences of the brain and surrounding structures, and cervical spine, to include the craniocervical junction and cervicothoracic junction, were obtained without and with intravenous contrast. CONTRAST:  15mL MULTIHANCE GADOBENATE DIMEGLUMINE 529 MG/ML IV SOLN COMPARISON:  Head CT without contrast 05/07/2017. Brain MRI 03/18/2017, cervical spine MRI 10/04/2014 and 03/31/2012 FINDINGS: MRI HEAD FINDINGS Brain: Stable cerebral volume. No restricted diffusion to suggest acute infarction. No midline shift, mass effect, evidence of mass lesion, ventriculomegaly, extra-axial collection or acute intracranial hemorrhage. Cervicomedullary junction and pituitary are within normal limits. Cerebral white matter signal abnormality in the form of scattered small foci, without a periventricular prep collection. Frontal lobe central and subcortical white matter appears most affected. Lesser temporal lobe involvement, mostly on the left.  No corpus callosum involvement. No cortical involvement or cortical encephalomalacia identified. Bilateral deep gray matter nuclei, brainstem, and cerebellum remain spared. No progression since May. No abnormal enhancement identified. No restricted lesions. Vascular: Major intracranial vascular flow voids are stable. Skull and upper cervical spine: Normal bone marrow signal. Sinuses/Orbits: Stable orbits soft tissues. Paranasal sinuses and mastoids are stable and well pneumatized. Other: Visible internal auditory structures appear normal. MRI CERVICAL SPINE FINDINGS Alignment: Straightening of cervical lordosis. Vertebrae: No marrow edema or evidence of acute osseous abnormality. Cord: Due to significant recurrent motion artifact on cervical spine imaging since 2015, spinal cord lesions today are compared to 2013. Furthermore, the  axial T2 and T2* imaging today is degraded by motion despite repeated imaging attempts. Significant STIR heterogeneity throughout the cervical spinal cord with patchy hyperintense cord lesions at the dorsal C2-C3 level, left dorsal C4-C5 level, right dorsal C6 level, and ventral C6-C7 level. See series 16, image 8. This appears progressed since 2013, although patchy cord heterogeneity was present at that time. There is no associated spinal cord enhancement or abnormal intradural enhancement or dural thickening. Posterior Fossa, vertebral arteries, paraspinal tissues: Preserved vascular flow voids in the neck. Negative neck soft tissues. Disc levels: Chronic disc and endplate degeneration at C3-C4 and C5-C6 with up to mild spinal stenosis at both levels. Chronic moderate to severe bilateral C4 neural foraminal stenosis. Chronic moderate left C6 neural foraminal stenosis. Cervical spine degeneration has not significantly progressed since 2013. IMPRESSION: 1. Relatively mild chronic cerebral white matter signal abnormality is stable, with no evidence of acute or progressive intracranial  demyelination. 2. 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 3. No new intracranial abnormality. 4. Chronic C3-C4 and C5-C6 spinal degeneration is stable since 2013. Electronically Signed   By: Odessa Fleming M.D.   On: 08/06/2017 09:01   Scheduled Meds: . baclofen  20 mg Oral QID  . dalfampridine  10 mg Oral BID  . gabapentin  600 mg Oral QID  . levETIRAcetam  500 mg Oral BID  . Oxcarbazepine  300 mg Oral TID  . pantoprazole  40 mg Oral BID  . rivaroxaban  10 mg Oral Daily  . sertraline  50 mg Oral Daily  . topiramate  25 mg Oral Q24H  . topiramate  50 mg Oral BID   Continuous Infusions: . methylPREDNISolone (SOLU-MEDROL) injection Stopped (08/07/17 1145)     LOS: 1 day   Merlene Laughter, DO [facility] Pager [phone]  If 7PM-7AM, please contact night-coverage [email or web address] Password [email or web address] 08/07/2017, 3:48 PM

## 2017-08-07 NOTE — Progress Notes (Signed)
NIF and VC performed.  NIF= -25 VC= 1.6L.

## 2017-08-07 NOTE — Progress Notes (Signed)
NIF and Vital Capacity performed NIF= -35 VC= 1.8L

## 2017-08-07 NOTE — Progress Notes (Signed)
Subjective: The patient is still feeling weak; however, he feels he may be a little bit stronger than yesterday. We discussed the Solu-Medrol which was initiated yesterday. He continues to have a headache which he rates as a 7 on a 1-10 scale. He still has some visual disturbances consisting of wavy lines in his visual fields.he also complains of some mild dizziness. He states he has not been out of bed.  Exam: Vitals:   08/06/17 2202 08/07/17 0618  BP: 98/61 135/80  Pulse: 64 (!) 55  Resp: 18 18  Temp: 98.2 F (36.8 C) 97.7 F (36.5 C)  SpO2: 92% 95%    HEENT-  Normocephalic, no lesions, without obvious abnormality.  Normal external eye and conjunctiva.  Normal TM's bilaterally.  Normal auditory canals and external ears. Normal external nose, mucus membranes and septum.  Normal pharynx.  Neuro:alert and able to follow all commands. He was uncertain of the month which she believed was October. He was also unclear about the day of the week which he felt was Thursday. He knows it is 2018. He knew the name of the president. HA:FBXUXY are equal and round. They are symmetrically reactive from 3-->2 mm. EOMI without nystagmus. Facial sensation is intact to light touch. Face is symmetric at rest with normal strength and mobility. Hearing is intact to conversational voice. Palate elevates symmetrically and uvula is midline. Voice is normal in tone, pitch and quality. Bilateral SCM and trapezii are 5/5. Tongue is midline with normal bulk and mobility.  Motor:bilateral upper extremities - 4/5. No drift noted although he did have some spasms in his left hand when outstretched. Left lower extremity1/5. Right lower extremity 3/5. Poor effort. Sensation: Intact to light touch.  DTRs:3+, symmetric     Pertinent Labs/Diagnostics:  Mr Lodema Pilot Contrast Mr Cervical Spine W Wo Contrast 08/06/2017 IMPRESSION:  1. Relatively mild chronic cerebral white matter signal abnormality is stable, with no  evidence of acute or progressive intracranial demyelination.  2. 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  3. No new intracranial abnormality.  4. Chronic C3-C4 and C5-C6 spinal degeneration is stable since 2013.    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 Patient has significant weakness at baseline. Somewhat improved since yesterday.  3 doses of Solu-Medrol yesterday.  Possible continued migraine headaches. The patient states little relief with current medical regimen.   Recommendations: Continue to follow  Hassel Neth [facility] Pager [phone] 08/07/2017, 8:55 AM  If 7pm- 7am, please page neurology on call as listed in AMION.  08/07/2017, 8:40 AM   I seen the patient and reviewed the above note. I do wonder if some of his symptoms represents recrudescence in the setting of migraine, and some of the symptoms he describes very much sound like migraine aura. I would favor starting scheduled medications to try and treat status migrant nose.  Ritta Slot, MD [facility] [phone]  If 7pm- 7am, please page neurology on call as listed in AMION.

## 2017-08-08 ENCOUNTER — Inpatient Hospital Stay (HOSPITAL_COMMUNITY): Payer: Medicare Other

## 2017-08-08 LAB — COMPREHENSIVE METABOLIC PANEL
ALK PHOS: 67 U/L (ref 38–126)
ALT: 15 U/L — ABNORMAL LOW (ref 17–63)
ANION GAP: 6 (ref 5–15)
AST: 18 U/L (ref 15–41)
Albumin: 3.2 g/dL — ABNORMAL LOW (ref 3.5–5.0)
BUN: 15 mg/dL (ref 6–20)
CALCIUM: 8.9 mg/dL (ref 8.9–10.3)
CO2: 20 mmol/L — AB (ref 22–32)
Chloride: 115 mmol/L — ABNORMAL HIGH (ref 101–111)
Creatinine, Ser: 0.96 mg/dL (ref 0.61–1.24)
Glucose, Bld: 146 mg/dL — ABNORMAL HIGH (ref 65–99)
Potassium: 4.7 mmol/L (ref 3.5–5.1)
Sodium: 141 mmol/L (ref 135–145)
TOTAL PROTEIN: 5.8 g/dL — AB (ref 6.5–8.1)
Total Bilirubin: 0.5 mg/dL (ref 0.3–1.2)

## 2017-08-08 LAB — CBC WITH DIFFERENTIAL/PLATELET
Basophils Absolute: 0 10*3/uL (ref 0.0–0.1)
Basophils Relative: 0 %
EOS ABS: 0 10*3/uL (ref 0.0–0.7)
EOS PCT: 0 %
HCT: 38.5 % — ABNORMAL LOW (ref 39.0–52.0)
HEMOGLOBIN: 12.2 g/dL — AB (ref 13.0–17.0)
LYMPHS ABS: 0.7 10*3/uL (ref 0.7–4.0)
Lymphocytes Relative: 5 %
MCH: 25.9 pg — AB (ref 26.0–34.0)
MCHC: 31.7 g/dL (ref 30.0–36.0)
MCV: 81.7 fL (ref 78.0–100.0)
MONOS PCT: 3 %
Monocytes Absolute: 0.3 10*3/uL (ref 0.1–1.0)
NEUTROS PCT: 92 %
Neutro Abs: 12 10*3/uL — ABNORMAL HIGH (ref 1.7–7.7)
Platelets: 240 10*3/uL (ref 150–400)
RBC: 4.71 MIL/uL (ref 4.22–5.81)
RDW: 17 % — ABNORMAL HIGH (ref 11.5–15.5)
WBC: 13 10*3/uL — ABNORMAL HIGH (ref 4.0–10.5)

## 2017-08-08 LAB — MAGNESIUM: MAGNESIUM: 2.1 mg/dL (ref 1.7–2.4)

## 2017-08-08 LAB — CREATININE, SERUM
CREATININE: 0.97 mg/dL (ref 0.61–1.24)
GFR calc Af Amer: >60 mL/min (ref >60)

## 2017-08-08 LAB — PHOSPHORUS: PHOSPHORUS: 2.6 mg/dL (ref 2.5–4.6)

## 2017-08-08 MED ORDER — METOCLOPRAMIDE HCL 5 MG/ML IJ SOLN
10.0000 mg | Freq: Three times a day (TID) | INTRAMUSCULAR | Status: DC
  Administered 2017-08-08 – 2017-08-09 (×3): 10 mg via INTRAVENOUS
  Filled 2017-08-08 (×3): qty 2

## 2017-08-08 MED ORDER — DIHYDROERGOTAMINE MESYLATE 1 MG/ML IJ SOLN
0.5000 mg | Freq: Three times a day (TID) | INTRAMUSCULAR | Status: DC
  Administered 2017-08-08: 0.5 mg via INTRAVENOUS
  Filled 2017-08-08: qty 0.5

## 2017-08-08 MED ORDER — GADOBENATE DIMEGLUMINE 529 MG/ML IV SOLN
18.0000 mL | Freq: Once | INTRAVENOUS | Status: AC
  Administered 2017-08-08: 18 mL via INTRAVENOUS

## 2017-08-08 MED ORDER — DIHYDROERGOTAMINE MESYLATE 1 MG/ML IJ SOLN
0.7500 mg | Freq: Three times a day (TID) | INTRAMUSCULAR | Status: DC
  Administered 2017-08-09 (×2): 0.8 mg via INTRAVENOUS
  Filled 2017-08-08 (×3): qty 0.8

## 2017-08-08 MED ORDER — TOPIRAMATE 100 MG PO TABS
100.0000 mg | ORAL_TABLET | Freq: Two times a day (BID) | ORAL | Status: DC
Start: 2017-08-08 — End: 2017-08-09
  Administered 2017-08-08 – 2017-08-09 (×2): 100 mg via ORAL
  Filled 2017-08-08 (×2): qty 1

## 2017-08-08 NOTE — Progress Notes (Signed)
Subjective: No significant changes, the patient continues to have Headache. He complains of stuttering speech.  Exam: Vitals:   08/08/17 0517 08/08/17 1510  BP: (!) 109/56 131/80  Pulse: (!) 59 76  Resp: 18 18  Temp: 97.9 F (36.6 C) 98.1 F (36.7 C)  SpO2: 98% 94%   Gen: In bed, NAD Resp: non-labored breathing, no acute distress Abd: soft, nt  Neuro: MS: Awake, alert, interactive and appropriate. CN: Pupils equal round and reactive to light, extra movements intact. Visual fields full. Motor: 4/5 in his uppers, he has very poor effort in his lower extremities  Pertinent Labs: CMP-and a marker\  MRI T spine-no enhancing lesions  Impression: 56 year old male with generalized weakness, headaches. Description of the headache and associated symptoms of vision change, waxing and waning confusion, unilateral photophobic and throbbing character are all supportive of migraine. He has a history of headaches including migraines.  With no enhancement, I don't strongly suspect that this wasn't MS exacerbation. I think there may be some degree of recrudescence due to his headaches.  He has not responded to first line therapies, I will try DHE.  Recommendations: 1) DHE 0.5 mg every 8 hours preceded by Reglan 2) if no improvement with this, then could consider adding amitriptyline but he would not need to remain hospitalized for that.  Ritta Slot, MD [facility] [phone]  If 7pm- 7am, please page neurology on call as listed in AMION.

## 2017-08-08 NOTE — Progress Notes (Signed)
PROGRESS NOTE    [name]  ZOX:[id number] DOB: 11/10/1961 DOA: 08/04/2017 PCP: Gary Contras, MD   Brief Narrative:  [name] a 56 y.o.malewith a past medical history significant for MS wc bound on new anti-CD20, hx of DVT on Xarelto, HTN, and hep C recently treatedwho presents with weakness for 4 days.  The patient was in his usual state of health until over the weekend when he developed whole-body weakness. He is normally able to walk about half a block with a walker, but is usually wheelchair-bound. But over the weekend he's become so weak that he can barely stand, cannot walk 2 steps, and sometimes falls over because he cannot hold his body upon sitting up. There is no numbness, slurred speech. He's had neck pain and headache (although these are chronic), he has had abdominal pain, not worse with eating, position, or any other provoking factor. He's had some nausea and occasional vomiting, he has had cough, he has had frequent urination but no dysuria, he has had back pain but no fever. Of note, he recently last week right before symptoms started got the second of the first 2 infusions of ocrelizumab his anti-CD20 treatment. He has not called his Neurologist about this new symptoms.   Was admitted for LE weakness especially Right Leg and Neurology consulted and ordering T-Spine and L-Spine for unilateral Right Leg weakness and starting the patient on IV Solumedrol 1 gram Daily x 3 (Day 3 of 3 today).MRI done and showed no enhancing lesions. Hospitalization complicated by Headache as well which did not respond to first line therapies so Neurology ordering DHE.   Assessment & Plan:   Principal Problem:   Hypokalemia Active Problems:   Multiple sclerosis (HCC)   DVT (deep venous thrombosis) (HCC)   Essential hypertension   Chronic cluster headache, not intractable   Chronic hepatitis C virus infection (HCC)   Chronic pain syndrome   Major depressive disorder, recurrent  episode (HCC)  Lower Extremity Weakness especially in Right Leg: -Hypokalemia vs MS flare; Neurology Empirically starting Solumedrol 1 gram daily x 3 days as patient continues to feel weak; Day 2/3 today -MRI of Brain and C-Spine showed Relatively mild chronic cerebral white matter signal abnormality is stable, with no evidence of acute or progressive intracranial demyelination. 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 No new intracranial abnormality. Chronic C3-C4 and C5-C6 spinal degeneration is stable since 2013 -Doubt infection, and no focal symptoms/signs-- x ray of chest and U//A normal -Replaced Mag, K -K+ Corrected and continues to have weakness -MRI Thoracic and Lumbar Spine done and showed as seen on recent cervical MRI, there is mild T2 hyperintensity in the cord on the right at T1-2 consistent with demyelination. Questionable demyelinating lesion at T12- L1. No other demyelinating lesions identified in the cord. No cord enlargement or abnormal enhancement. No disc herniation, spinal stenosis or nerve root encroachment. 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 -Neurology does not feel strongly that this was a MS Exacerbation    Relapsing/Remitting Multiple Sclersosis: -Continue Baclofen 20 mg po QID, Dalfampridine 10 mg po BID, Gabapentin 600 mg QID -Neurology consult appreciated -MRI of brain/cervical spine as above -MRI T-Spine and L-Spine ordered and as above and Neurology gave Empiric High dose Steroids 1 g IV Daily x 3 days (Day 3/3) -Holding Ocrelizumab  -Appreciate Neuro Recc's as above  Hypokalemia, improved -Replete to normal and now is 4.3 -  Not an improvement in weakness -Denies taking HCTZ (3 versions present on home med list), denies diarrhea, or decreased PO intake  Hypertension -Stable. BP Controlled without Medication but has  several Antihypertensives on MAR including HCTZ, Lisinopril-HCTZ, Triamterene-HCTZ, and Verapamil -Continue to Monitor off Medication  History of DVT -Continue Rivaroxaban 10 mg po Daily  Depression -C/w Sertraline 50 mg po Daily  Hepatitis C -Recently Treated  Hx of Cluster Headaches/Migraine and Acute Headache -C/w Topomax and increased to 100 mg po BID by Neurology; Neurology also gave patient Valparoate 1000 mg x 1 dose yesterday  -Stated he had a headache with visual disturbances and likely was a migraine -Given Ketorolac overnight; Neurology tried headache cocktail with Compazine and since that was ineffective will try DHE 0.8 mg injections preceded by Reglan -If no improvement Neurology recommending adding 25 mg of Amitriptyline qHS -Continue to Monitor   DVT prophylaxis: Anticoagulated with Rivaroxaban  Code Status: FULL CODE Family Communication: No family present at bedside Disposition Plan: Anticipate D/C Home with Home Health when cleared by Neurology   Consultants:   Neurology   Procedures: None   Antimicrobials:  Anti-infectives    None     Subjective: Seen and examined and his main complaint was headache. States strength is improving. No nausea or vomiting. No other concerns or complaints at this time.   Objective: Vitals:   08/07/17 1433 08/07/17 2132 08/08/17 0517 08/08/17 1510  BP: 110/63 116/70 (!) 109/56 131/80  Pulse: 76 66 (!) 59 76  Resp: 16 18 18 18   Temp: 98.2 F (36.8 C) 97.7 F (36.5 C) 97.9 F (36.6 C) 98.1 F (36.7 C)  TempSrc: Oral Oral Oral Oral  SpO2: 96% 94% 98% 94%  Weight:      Height:        Intake/Output Summary (Last 24 hours) at 08/08/17 1836 Last data filed at 08/08/17 1700  Gross per 24 hour  Intake              240 ml  Output              500 ml  Net             -260 ml   Filed Weights   08/05/17 0150  Weight: 82 kg (180 lb 12.4 oz)    Examination: Physical Exam:  Constitutional: WN/WD obese AAM who  appears in NAD but is complaining of a headache  Eyes: Sclerae anicteric. Lids normal ENMT: External ears and nose appear normal. MMM Neck: Supple with no JVD Respiratory: CTAB; No appreciable wheezing/rales/rhonchi Cardiovascular: RRR; No appreciable edema Abdomen: Soft, NT, ND, Bowel sounds present GU: Deferred Musculoskeletal: No contractures; No cyanosis  Skin: No rashes or lesions on a limited skin eval; Has a tattoo on chest Neurologic: CN 2-12 grossly intact. Stutters some. Right leg weaker than left but ? effort Psychiatric: Frustrated mood and affect. Intact judgement and insight  Data Reviewed: I have personally reviewed following labs and imaging studies  CBC:  Recent Labs Lab 08/04/17 1822 08/05/17 0434 08/06/17 0615 08/07/17 0448 08/08/17 0315  WBC 4.4 5.1 4.5 5.8 13.0*  NEUTROABS 2.6  --   --  5.3 12.0*  HGB 13.8 13.4 12.5* 13.0 12.2*  HCT 42.0 41.1 38.4* 40.8 38.5*  MCV 80.3 80.0 81.0 81.1 81.7  PLT 253 243 248 241 240   Basic Metabolic Panel:  Recent Labs Lab 08/04/17 1822 08/04/17 2120 08/05/17 0434 08/06/17 0615 08/07/17 0448 08/08/17 0315 08/08/17 1445  NA 140  --  138 141 141 141  --   K 2.7*  --  3.4* 3.8 4.3 4.7  --   CL 109  --  109 115* 115* 115*  --   CO2 24  --  21* 21* 20* 20*  --   GLUCOSE 114*  --  100* 102* 160* 146*  --   BUN 6  --  5* --   CREATININE 0.81  --  0.80 0.83 0.93 0.96 0.97  CALCIUM 8.7*  --  8.7* 8.7* 9.0 8.9  --   MG  --  2.3  --   --  2.0 2.1  --   PHOS  --   --   --   --  1.7* 2.6  --    GFR: Estimated Creatinine Clearance: 85 mL/min (by C-G formula based on SCr of 0.97 mg/dL). Liver Function Tests:  Recent Labs Lab 08/04/17 1822 08/07/17 0448 08/08/17 0315  AST ALT 17 14* 15*  ALKPHOS 81 70 67  BILITOT 0.7 0.3 0.5  PROT 6.7 6.1* 5.8*  ALBUMIN 3.6 3.3* 3.2*   No results for input(s): LIPASE, AMYLASE in the last 168 hours. No results for input(s): AMMONIA in the last 168  hours. Coagulation Profile: No results for input(s): INR, PROTIME in the last 168 hours. Cardiac Enzymes: No results for input(s): CKTOTAL, CKMB, CKMBINDEX, TROPONINI in the last 168 hours. BNP (last 3 results) No results for input(s): PROBNP in the last 8760 hours. HbA1C: No results for input(s): HGBA1C in the last 72 hours. CBG:  Recent Labs Lab 08/06/17 0752  GLUCAP 113*   Lipid Profile: No results for input(s): CHOL, HDL, LDLCALC, TRIG, CHOLHDL, LDLDIRECT in the last 72 hours. Thyroid Function Tests: No results for input(s): TSH, T4TOTAL, FREET4, T3FREE, THYROIDAB in the last 72 hours. Anemia Panel: No results for input(s): VITAMINB12, FOLATE, FERRITIN, TIBC, IRON, RETICCTPCT in the last 72 hours. Sepsis Labs: No results for input(s): PROCALCITON, LATICACIDVEN in the last 168 hours.  Recent Results (from the past 240 hour(s))  Culture, Urine     Status: None   Collection Time: 08/04/17  1:23 AM  Result Value Ref Range Status   Specimen Description URINE, CLEAN CATCH  Final   Special Requests NONE  Final   Culture NO GROWTH  Final   Report Status 08/06/2017 FINAL  Final  Culture, blood (routine x 2)     Status: None (Preliminary result)   Collection Time: 08/04/17  9:20 PM  Result Value Ref Range Status   Specimen Description BLOOD RIGHT ANTECUBITAL  Final   Special Requests   Final    BOTTLES DRAWN AEROBIC AND ANAEROBIC Blood Culture adequate volume   Culture NO GROWTH 4 DAYS  Final   Report Status PENDING  Incomplete  Culture, blood (routine x 2)     Status: None (Preliminary result)   Collection Time: 08/04/17  9:27 PM  Result Value Ref Range Status   Specimen Description BLOOD RIGHT HAND  Final   Special Requests   Final    BOTTLES DRAWN AEROBIC AND ANAEROBIC Blood Culture adequate volume   Culture NO GROWTH 4 DAYS  Final   Report Status PENDING  Incomplete  MRSA PCR Screening     Status: None   Collection Time: 08/05/17  7:05 AM  Result Value Ref Range  Status   MRSA by PCR NEGATIVE NEGATIVE Final    Comment:        The GeneXpert MRSA Assay (FDA  approved for NASAL specimens only), is one component of a comprehensive MRSA colonization surveillance program. It is not intended to diagnose MRSA infection nor to guide or monitor treatment for MRSA infections.     Radiology Studies: Mr Thoracic Spine W Wo Contrast  Result Date: 08/08/2017 CLINICAL DATA:  Muscle weakness. Multiple sclerosis. Chronic pain syndrome. EXAM: MRI THORACIC AND LUMBAR SPINE WITHOUT AND WITH CONTRAST TECHNIQUE: Multiplanar and multiecho pulse sequences of the thoracic and lumbar spine were obtained without and with intravenous contrast. CONTRAST:  20 ml MultiHance. COMPARISON:  Cervical MRI 08/06/2017, chest CT 11/15/2016. Thoracic MRI 12/13/2005. FINDINGS: MRI THORACIC SPINE FINDINGS Alignment: There is a stable S-shaped scoliosis of the upper to mid thoracic spine. The lateral alignment is normal. Vertebrae: No vertebral anomaly or acute osseous findings. Cord: There is mild T2 hyperintensity in the right aspect of the cord at the T1-2 level. No other definite abnormal cord signal demonstrated. There is questionable T2 hyperintensity in the cord at the T12-L1 level on the sagittal inversion recovery images through the lumbar spine (image 80 of series 6). There is no abnormal cord enhancement. Cord evaluation is mildly limited by motion artifact. Paraspinal and other soft tissues: No paraspinal abnormalities are seen. Disc levels: No large disc herniation, spinal stenosis or nerve root encroachment. There is mild endplate osteophyte formation in the thoracic spine. There is mild disc bulging at C5-6. MRI LUMBAR SPINE FINDINGS Segmentation: Counted from above, there is transitional lumbosacral anatomy with a nearly fully lumbarized S1 segment. By this numbering, there is an old healed fracture of the L1 vertebral body which appears stable. Alignment:  Normal. Vertebrae: As above,  transitional lumbosacral anatomy with a transitional S1 segment. Old L1 fracture is healed without osseous retropulsion. No worrisome osseous findings. Conus medullaris: Extends to the L2 level and appears normal. No abnormal intradural enhancement. Paraspinal and other soft tissues: No paraspinal abnormalities are seen. There are small renal cysts bilaterally. Disc levels: The lumbar discs are well hydrated with maintained height. There is no disc herniation, spinal stenosis or nerve root encroachment. Mild facet and ligamentous hypertrophy is present at the L4-5 and L5-S1 levels. There is no resulting foraminal compromise. IMPRESSION: 1. As seen on recent cervical MRI, there is mild T2 hyperintensity in the cord on the right at T1-2 consistent with demyelination. Questionable demyelinating lesion at T12- L1. 2. No other demyelinating lesions identified in the cord. No cord enlargement or abnormal enhancement. 3. No disc herniation, spinal stenosis or nerve root encroachment. 4. 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 Electronically Signed   By: Carey Bullocks M.D.   On: 08/08/2017 14:46   Mr Lumbar Spine W Wo Contrast  Result Date: 08/08/2017 CLINICAL DATA:  Muscle weakness. Multiple sclerosis. Chronic pain syndrome. EXAM: MRI THORACIC AND LUMBAR SPINE WITHOUT AND WITH CONTRAST TECHNIQUE: Multiplanar and multiecho pulse sequences of the thoracic and lumbar spine were obtained without and with intravenous contrast. CONTRAST:  20 ml MultiHance. COMPARISON:  Cervical MRI 08/06/2017, chest CT 11/15/2016. Thoracic MRI 12/13/2005. FINDINGS: MRI THORACIC SPINE FINDINGS Alignment: There is a stable S-shaped scoliosis of the upper to mid thoracic spine. The lateral alignment is normal. Vertebrae: No vertebral anomaly or acute osseous findings. Cord: There is mild T2 hyperintensity in the right aspect of the cord at the T1-2 level. No other definite abnormal cord signal demonstrated.  There is questionable T2 hyperintensity in the cord at the T12-L1 level on the sagittal inversion recovery images through the lumbar spine (  image 80 of series 6). There is no abnormal cord enhancement. Cord evaluation is mildly limited by motion artifact. Paraspinal and other soft tissues: No paraspinal abnormalities are seen. Disc levels: No large disc herniation, spinal stenosis or nerve root encroachment. There is mild endplate osteophyte formation in the thoracic spine. There is mild disc bulging at C5-6. MRI LUMBAR SPINE FINDINGS Segmentation: Counted from above, there is transitional lumbosacral anatomy with a nearly fully lumbarized S1 segment. By this numbering, there is an old healed fracture of the L1 vertebral body which appears stable. Alignment:  Normal. Vertebrae: As above, transitional lumbosacral anatomy with a transitional S1 segment. Old L1 fracture is healed without osseous retropulsion. No worrisome osseous findings. Conus medullaris: Extends to the L2 level and appears normal. No abnormal intradural enhancement. Paraspinal and other soft tissues: No paraspinal abnormalities are seen. There are small renal cysts bilaterally. Disc levels: The lumbar discs are well hydrated with maintained height. There is no disc herniation, spinal stenosis or nerve root encroachment. Mild facet and ligamentous hypertrophy is present at the L4-5 and L5-S1 levels. There is no resulting foraminal compromise. IMPRESSION: 1. As seen on recent cervical MRI, there is mild T2 hyperintensity in the cord on the right at T1-2 consistent with demyelination. Questionable demyelinating lesion at T12- L1. 2. No other demyelinating lesions identified in the cord. No cord enlargement or abnormal enhancement. 3. No disc herniation, spinal stenosis or nerve root encroachment. 4. 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 Electronically Signed   By: Carey Bullocks M.D.   On: 08/08/2017 14:46    Scheduled Meds: . baclofen  20 mg Oral QID  . dalfampridine  10 mg Oral BID  . dihydroergotamine  0.5 mg Intravenous Q8H  . gabapentin  600 mg Oral QID  . levETIRAcetam  500 mg Oral BID  . metoCLOPramide (REGLAN) injection  10 mg Intravenous Q8H  . Oxcarbazepine  300 mg Oral TID  . pantoprazole  40 mg Oral BID  . rivaroxaban  10 mg Oral Daily  . sertraline  50 mg Oral Daily  . topiramate  100 mg Oral BID   Continuous Infusions:   LOS: 2 days   Merlene Laughter, DO [facility] Pager [phone]  If 7PM-7AM, please contact night-coverage [email or web address] Password [facility] 08/08/2017, 6:36 PM

## 2017-08-08 NOTE — Progress Notes (Signed)
Pt not in room checked on twice.  NIF and VC not performed

## 2017-08-08 NOTE — Progress Notes (Addendum)
Orthostatic vitals ordered, patient experiencing vertigo, provider notified.  Home med requested again of patient.

## 2017-08-09 LAB — COMPREHENSIVE METABOLIC PANEL
ALBUMIN: 3.3 g/dL — AB (ref 3.5–5.0)
ALT: 19 U/L (ref 17–63)
ANION GAP: 8 (ref 5–15)
AST: 15 U/L (ref 15–41)
Alkaline Phosphatase: 73 U/L (ref 38–126)
BILIRUBIN TOTAL: 0.3 mg/dL (ref 0.3–1.2)
BUN: 14 mg/dL (ref 6–20)
CHLORIDE: 114 mmol/L — AB (ref 101–111)
CO2: 21 mmol/L — ABNORMAL LOW (ref 22–32)
Calcium: 8.8 mg/dL — ABNORMAL LOW (ref 8.9–10.3)
Creatinine, Ser: 0.8 mg/dL (ref 0.61–1.24)
GFR calc Af Amer: >60 mL/min (ref >60)
Glucose, Bld: 147 mg/dL — ABNORMAL HIGH (ref 65–99)
POTASSIUM: 4.1 mmol/L (ref 3.5–5.1)
Sodium: 143 mmol/L (ref 135–145)
TOTAL PROTEIN: 6.3 g/dL — AB (ref 6.5–8.1)

## 2017-08-09 LAB — PHOSPHORUS: Phosphorus: 2.5 mg/dL (ref 2.5–4.6)

## 2017-08-09 LAB — CULTURE, BLOOD (ROUTINE X 2)
CULTURE: NO GROWTH
Culture: NO GROWTH
SPECIAL REQUESTS: ADEQUATE
SPECIAL REQUESTS: ADEQUATE

## 2017-08-09 LAB — CBC WITH DIFFERENTIAL/PLATELET
BASOS ABS: 0 10*3/uL (ref 0.0–0.1)
BASOS PCT: 0 %
EOS PCT: 0 %
Eosinophils Absolute: 0 10*3/uL (ref 0.0–0.7)
HEMATOCRIT: 41.9 % (ref 39.0–52.0)
Hemoglobin: 13.4 g/dL (ref 13.0–17.0)
Lymphocytes Relative: 4 %
Lymphs Abs: 0.4 10*3/uL — ABNORMAL LOW (ref 0.7–4.0)
MCH: 25.8 pg — ABNORMAL LOW (ref 26.0–34.0)
MCHC: 32 g/dL (ref 30.0–36.0)
MCV: 80.6 fL (ref 78.0–100.0)
MONO ABS: 0.1 10*3/uL (ref 0.1–1.0)
MONOS PCT: 1 %
NEUTROS ABS: 9.5 10*3/uL — AB (ref 1.7–7.7)
Neutrophils Relative %: 95 %
PLATELETS: 254 10*3/uL (ref 150–400)
RBC: 5.2 MIL/uL (ref 4.22–5.81)
RDW: 17.2 % — AB (ref 11.5–15.5)
WBC: 10 10*3/uL (ref 4.0–10.5)

## 2017-08-09 LAB — MAGNESIUM: MAGNESIUM: 2.1 mg/dL (ref 1.7–2.4)

## 2017-08-09 MED ORDER — HEPARIN SOD (PORK) LOCK FLUSH 100 UNIT/ML IV SOLN
500.0000 [IU] | INTRAVENOUS | Status: AC | PRN
  Administered 2017-08-09: 500 [IU]

## 2017-08-09 MED ORDER — NORTRIPTYLINE HCL 25 MG PO CAPS: 25.0000 mg | ORAL_CAPSULE | Freq: Every day | ORAL | 0 refills | Status: DC

## 2017-08-09 NOTE — Progress Notes (Signed)
[name] to be D/C'd Home per MD order.  Discussed with the patient and all questions fully answered.  An After Visit Summary was printed and given to the patient. Patient received prescription.  D/c education completed with patient/family including follow up instructions, medication list, d/c activities limitations if indicated, with other d/c instructions as indicated by MD - patient able to verbalize understanding, all questions fully answered.   Patient instructed to return to ED, call 911, or call MD for any changes in condition.   Patient escorted via WC, and D/C home via private auto. Allergies as of 08/09/2017   No Known Allergies     Medication List    STOP taking these medications   hydrochlorothiazide 25 MG tablet Commonly known as:  HYDRODIURIL   OCREVUS 300 MG/10ML injection Generic drug:  ocrelizumab   triamterene-hydrochlorothiazide 37.5-25 MG tablet Commonly known as:  MAXZIDE-25     TAKE these medications   baclofen 20 MG tablet Commonly known as:  LIORESAL Take 20 mg by mouth 4 (four) times daily.   dalfampridine 10 MG Tb12 Take 1 tablet (10 mg total) by mouth 2 (two) times daily.   eszopiclone 3 MG Tabs Generic drug:  Eszopiclone Take 3 mg by mouth at bedtime as needed (for sleep).   gabapentin 600 MG tablet Commonly known as:  NEURONTIN Take 600 mg by mouth 4 (four) times daily.   levETIRAcetam 500 MG tablet Commonly known as:  KEPPRA Take 500 mg by mouth 2 (two) times daily.   lisinopril-hydrochlorothiazide 10-12.5 MG tablet Commonly known as:  PRINZIDE,ZESTORETIC Take 1 tablet by mouth daily.   nortriptyline 25 MG capsule Commonly known as:  PAMELOR Take 1 capsule (25 mg total) by mouth at bedtime.   omeprazole 40 MG capsule Commonly known as:  PRILOSEC Take 40 mg by mouth every evening. What changed:  Another medication with the same name was removed. Continue taking this medication, and follow the directions you see here.    Oxcarbazepine 300 MG tablet Commonly known as:  TRILEPTAL Take 300 mg by mouth 3 (three) times daily.   oxyCODONE-acetaminophen 10-325 MG tablet Commonly known as:  PERCOCET Take 1 tablet by mouth every 4 (four) hours as needed for pain.   sertraline 50 MG tablet Commonly known as:  ZOLOFT Take 50 mg by mouth daily.   topiramate 25 MG tablet Commonly known as:  TOPAMAX Take 1 tablet (25 mg total) by mouth 2 (two) times daily. What changed:  how much to take  when to take this  additional instructions   verapamil 80 MG tablet Commonly known as:  CALAN Take 80 mg by mouth 2 (two) times daily.   XARELTO 10 MG Tabs tablet Generic drug:  rivaroxaban Take 10 mg by mouth daily.            Discharge Care Instructions        Start     Ordered   08/09/17 0000  nortriptyline (PAMELOR) 25 MG capsule  Daily at bedtime     08/09/17 1238   08/09/17 0000  Increase activity slowly     08/09/17 1238   08/09/17 0000  Diet - low sodium heart healthy     08/09/17 1238   08/09/17 0000  Discharge instructions    Comments:  Follow up with PCP and Neurology as an outpatient. Take all medications as prescribed. If symptoms change or worsen please return to the ED for evaluation.   08/09/17 1238   08/09/17 0000  Call  MD for:  temperature >100.4     08/09/17 1238   08/09/17 0000  Call MD for:  persistant nausea and vomiting     08/09/17 1238   08/09/17 0000  Call MD for:  severe uncontrolled pain     08/09/17 1238   08/09/17 0000  Call MD for:  redness, tenderness, or signs of infection (pain, swelling, redness, odor or green/yellow discharge around incision site)     08/09/17 1238   08/09/17 0000  Call MD for:  difficulty breathing, headache or visual disturbances     08/09/17 1238   08/09/17 0000  Call MD for:  hives     08/09/17 1238   08/09/17 0000  Call MD for:  persistant dizziness or light-headedness     08/09/17 1238   08/09/17 0000  Call MD for:  extreme fatigue      08/09/17 1238     Starling Jessie K Ryder Man 08/09/2017 3:00 PM

## 2017-08-09 NOTE — Progress Notes (Signed)
NIF -20   VC 2.66L

## 2017-08-09 NOTE — Discharge Summary (Signed)
Physician Discharge Summary  Custer Pimenta ZOX:[id number] DOB: 12-Jan-1961 DOA: 08/04/2017  PCP: Fleet Contras, MD  Admit date: 08/04/2017 Discharge date: 08/09/2017  Admitted From: Home Disposition:  Home with Home Health PT  Recommendations for Outpatient Follow-up:  1. Follow up with PCP in 1-2 weeks 2. Follow up with Primary Neurology as an outpatient 3. Follow up with Infectious Diseases as an outpatient  4. Please obtain CMP/CBC, Mag, Phos in one week  Home Health: Yes Equipment/Devices: None recommended by PT  Discharge Condition: Stable CODE STATUS: FULL CODE Diet recommendation: Heart Healthy Diet  Brief/Interim Summary: Jermon Gilchrestis a 56 y.o.malewith a past medical history significant for MS wc bound on new anti-CD20, hx of DVT on Xarelto, HTN, and hep C recently treatedwho presents with weakness for 4 days.  The patient was in his usual state of health until over the weekend when he developed whole-body weakness. He is normally able to walk about half a block with a walker, but is usually wheelchair-bound. But over the weekend he's become so weak that he can barely stand, cannot walk 2 steps, and sometimes falls over because he cannot hold his body upon sitting up. There is no numbness, slurred speech. He's had neck pain and headache (although these are chronic), he has had abdominal pain, not worse with eating, position, or any other provoking factor. He's had some nausea and occasional vomiting, he has had cough, he has had frequent urination but no dysuria, he has had back pain but no fever. Of note, he recently last week right before symptoms started got the second of the first 2 infusions of ocrelizumab his anti-CD20 treatment. He has not called his Neurologist about this new symptoms.   Was admitted for LE weakness especially Right Leg and Neurology consulted and ordering T-Spine and L-Spine for unilateral Right Leg weakness and starting the patient on IV  Solumedrol 1 gram Daily x 3 (Day 3 of 3 today).MRI done and showed no enhancing lesions. Hospitalization complicated by Headache as well which did not respond to first line therapies so Neurology ordering DHE. Headache improved and patient felt better. Still felt a little dizzy but no other complaints. Patient was deemed medically stable to D/C home with Home Health and will need to follow up with PCP and Neurology as an outpatient.   Discharge Diagnoses:  Principal Problem:   Hypokalemia Active Problems:   Multiple sclerosis (HCC)   DVT (deep venous thrombosis) (HCC)   Essential hypertension   Chronic cluster headache, not intractable   Chronic hepatitis C virus infection (HCC)   Chronic pain syndrome   Major depressive disorder, recurrent episode (HCC)  Lower Extremity Weakness especially in Right Leg, improved -Hypokalemia vs MS flare; Neurology Empirically started Solumedrol 1 gram daily x 3 days as patient continues to feel weak; Day 3/3 yesterday  -MRI of Brain and C-Spine showed Relatively mild chronic cerebral white matter signal abnormality is stable, with no evidence of acute or progressive intracranial demyelination. 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 No new intracranial abnormality. Chronic C3-C4 and C5-C6 spinal degeneration is stable since 2013 -Doubt infection, and no focal symptoms/signs-- x ray of chest and U//A normal -ReplacedMag, K -K+ Corrected and continues to have weakness -MRI Thoracic and Lumbar Spine done and showed as seen on recent cervical MRI, there is mild T2 hyperintensity in the cord on the right at T1-2 consistent with demyelination. Questionable demyelinating lesion at T12-  L1. No other demyelinating lesions identified in the cord. No cord enlargement or abnormal enhancement. No disc herniation, spinal stenosis or nerve root encroachment. Transitional  lumbosacral anatomy with a nearly fully lumbarized S1 segment. -Neurology does not feel strongly that this was a MS Exacerbation  -Follow up with Primary Neurologist as an outpatient -PT recommending Home PT  Relapsing/Remitting Multiple Sclerosis, not in acute flare -Continue Baclofen 20 mg po QID, Dalfampridine 10 mg po BID, Gabapentin 600 mg QID -Neurology consult appreciated -MRI of brain/cervical spine as above -MRI T-Spine and L-Spine ordered and as above and Neurology gave Empiric High dose Steroids 1 g IV Daily x 3 days (Day 3/3) -Holding Ocrelizumab and defer to outpatient Neurology to start back -Appreciated Neuro Recc's as above  Hypokalemia, improved -Replete to normal and now is 4.1 -Not an improvement in weakness -Denies taking HCTZ (3 versions present on home med list), denies diarrhea, or decreased PO intake -Follow up with PCP to repeat CMP   Hypertension -Stable. BP Controlled without Medication but has several Antihypertensives on MAR including HCTZ, Lisinopril-HCTZ, Triamterene-HCTZ, and Verapamil -Continue to Monitor off Medication and will restart Lisinopril-HCTZ and Verapamil at D/C -Follow up with PCP for further BP Medication adjustments  History of DVT -Continue Rivaroxaban 10 mg po Daily  Depression -C/w Sertraline 50 mg po Daily  Hepatitis C -Recently Treated -Follow up with Infectious Diseases Dr. Drue Second  Hx of Cluster Headaches/Migraine and Acute Headache with Dizziness; Improved  -C/w Topomax and increased to 100 mg po BID by Neurology; Neurology also gave patient Valparoate 1000 mg x 1 dose 9/28 -Stated he had a headache with visual disturbances and likely was a migraine -Given Ketorolac on the night of 9/28/-9/29; Neurology tried headache cocktail with Compazine and since that was ineffective will try DHE 0.8 mg injections preceded by Reglan -Headache Improved. Per Neurology continue Home Topamax Dose and add 25 mg po qHS of  Nortriptyline -Continue to Monitor and Follow up with Neurology as an outpatient for Headache and Dizziness symptoms   Leukocytosis -Likely Reactive from IV Steroid Demargination -Improved as WBC went from 13.0 -> 10.0 -No S/Sx of Infection -Repeat CBC as an outpatient   Discharge Instructions  Discharge Instructions    Call MD for:  difficulty breathing, headache or visual disturbances    Complete by:  As directed    Call MD for:  extreme fatigue    Complete by:  As directed    Call MD for:  hives    Complete by:  As directed    Call MD for:  persistant dizziness or light-headedness    Complete by:  As directed    Call MD for:  persistant nausea and vomiting    Complete by:  As directed    Call MD for:  redness, tenderness, or signs of infection (pain, swelling, redness, odor or green/yellow discharge around incision site)    Complete by:  As directed    Call MD for:  severe uncontrolled pain    Complete by:  As directed    Call MD for:  temperature >100.4    Complete by:  As directed    Diet - low sodium heart healthy    Complete by:  As directed    Discharge instructions    Complete by:  As directed    Follow up with PCP and Neurology as an outpatient. Take all medications as prescribed. If symptoms change or worsen please return to the ED for evaluation.   Increase  activity slowly    Complete by:  As directed      Allergies as of 08/09/2017   No Known Allergies     Medication List    STOP taking these medications   hydrochlorothiazide 25 MG tablet Commonly known as:  HYDRODIURIL   OCREVUS 300 MG/10ML injection Generic drug:  ocrelizumab   triamterene-hydrochlorothiazide 37.5-25 MG tablet Commonly known as:  MAXZIDE-25     TAKE these medications   baclofen 20 MG tablet Commonly known as:  LIORESAL Take 20 mg by mouth 4 (four) times daily.   dalfampridine 10 MG Tb12 Take 1 tablet (10 mg total) by mouth 2 (two) times daily.   eszopiclone 3 MG  Tabs Generic drug:  Eszopiclone Take 3 mg by mouth at bedtime as needed (for sleep).   gabapentin 600 MG tablet Commonly known as:  NEURONTIN Take 600 mg by mouth 4 (four) times daily.   levETIRAcetam 500 MG tablet Commonly known as:  KEPPRA Take 500 mg by mouth 2 (two) times daily.   lisinopril-hydrochlorothiazide 10-12.5 MG tablet Commonly known as:  PRINZIDE,ZESTORETIC Take 1 tablet by mouth daily.   nortriptyline 25 MG capsule Commonly known as:  PAMELOR Take 1 capsule (25 mg total) by mouth at bedtime.   omeprazole 40 MG capsule Commonly known as:  PRILOSEC Take 40 mg by mouth every evening. What changed:  Another medication with the same name was removed. Continue taking this medication, and follow the directions you see here.   Oxcarbazepine 300 MG tablet Commonly known as:  TRILEPTAL Take 300 mg by mouth 3 (three) times daily.   oxyCODONE-acetaminophen 10-325 MG tablet Commonly known as:  PERCOCET Take 1 tablet by mouth every 4 (four) hours as needed for pain.   sertraline 50 MG tablet Commonly known as:  ZOLOFT Take 50 mg by mouth daily.   topiramate 25 MG tablet Commonly known as:  TOPAMAX Take 1 tablet (25 mg total) by mouth 2 (two) times daily. What changed:  how much to take  when to take this  additional instructions   verapamil 80 MG tablet Commonly known as:  CALAN Take 80 mg by mouth 2 (two) times daily.   XARELTO 10 MG Tabs tablet Generic drug:  rivaroxaban Take 10 mg by mouth daily.            Discharge Care Instructions        Start     Ordered   08/09/17 0000  nortriptyline (PAMELOR) 25 MG capsule  Daily at bedtime     08/09/17 1238   08/09/17 0000  Increase activity slowly     08/09/17 1238   08/09/17 0000  Diet - low sodium heart healthy     08/09/17 1238   08/09/17 0000  Discharge instructions    Comments:  Follow up with PCP and Neurology as an outpatient. Take all medications as prescribed. If symptoms change or worsen  please return to the ED for evaluation.   08/09/17 1238   08/09/17 0000  Call MD for:  temperature >100.4     08/09/17 1238   08/09/17 0000  Call MD for:  persistant nausea and vomiting     08/09/17 1238   08/09/17 0000  Call MD for:  severe uncontrolled pain     08/09/17 1238   08/09/17 0000  Call MD for:  redness, tenderness, or signs of infection (pain, swelling, redness, odor or green/yellow discharge around incision site)     08/09/17 1238   08/09/17 0000  Call MD for:  difficulty breathing, headache or visual disturbances     08/09/17 1238   08/09/17 0000  Call MD for:  hives     08/09/17 1238   08/09/17 0000  Call MD for:  persistant dizziness or light-headedness     08/09/17 1238   08/09/17 0000  Call MD for:  extreme fatigue     08/09/17 1238      No Known Allergies  Consultations:  Neurology Dr. Amada Jupiter   Procedures/Studies: Dg Chest 2 View  Result Date: 08/04/2017 CLINICAL DATA:  Generalized chest pain, dyspnea and dry cough. EXAM: CHEST  2 VIEW COMPARISON:  12/03/2016 FINDINGS: The heart size and mediastinal contours are within normal limits. Port catheter tip from right-sided IJ approach is noted at the cavoatrial junction. Both lungs are clear. The visualized skeletal structures are unremarkable. IMPRESSION: No active cardiopulmonary disease. Electronically Signed   By: Tollie Eth M.D.   On: 08/04/2017 21:56   Mr Laqueta Jean ZO Contrast  Result Date: 08/06/2017 CLINICAL DATA:  56 year old male with chronic multiple sclerosis. Recent increased whole-body weakness. EXAM: MRI HEAD WITHOUT AND WITH CONTRAST MRI CERVICAL SPINE WITHOUT AND WITH CONTRAST TECHNIQUE: Multiplanar, multiecho pulse sequences of the brain and surrounding structures, and cervical spine, to include the craniocervical junction and cervicothoracic junction, were obtained without and with intravenous contrast. CONTRAST:  15mL MULTIHANCE GADOBENATE DIMEGLUMINE 529 MG/ML IV SOLN COMPARISON:  Head CT  without contrast 05/07/2017. Brain MRI 03/18/2017, cervical spine MRI 10/04/2014 and 03/31/2012 FINDINGS: MRI HEAD FINDINGS Brain: Stable cerebral volume. No restricted diffusion to suggest acute infarction. No midline shift, mass effect, evidence of mass lesion, ventriculomegaly, extra-axial collection or acute intracranial hemorrhage. Cervicomedullary junction and pituitary are within normal limits. Cerebral white matter signal abnormality in the form of scattered small foci, without a periventricular prep collection. Frontal lobe central and subcortical white matter appears most affected. Lesser temporal lobe involvement, mostly on the left. No corpus callosum involvement. No cortical involvement or cortical encephalomalacia identified. Bilateral deep gray matter nuclei, brainstem, and cerebellum remain spared. No progression since May. No abnormal enhancement identified. No restricted lesions. Vascular: Major intracranial vascular flow voids are stable. Skull and upper cervical spine: Normal bone marrow signal. Sinuses/Orbits: Stable orbits soft tissues. Paranasal sinuses and mastoids are stable and well pneumatized. Other: Visible internal auditory structures appear normal. MRI CERVICAL SPINE FINDINGS Alignment: Straightening of cervical lordosis. Vertebrae: No marrow edema or evidence of acute osseous abnormality. Cord: Due to significant recurrent motion artifact on cervical spine imaging since 2015, spinal cord lesions today are compared to 2013. Furthermore, the axial T2 and T2* imaging today is degraded by motion despite repeated imaging attempts. Significant STIR heterogeneity throughout the cervical spinal cord with patchy hyperintense cord lesions at the dorsal C2-C3 level, left dorsal C4-C5 level, right dorsal C6 level, and ventral C6-C7 level. See series 16, image 8. This appears progressed since 2013, although patchy cord heterogeneity was present at that time. There is no associated spinal cord  enhancement or abnormal intradural enhancement or dural thickening. Posterior Fossa, vertebral arteries, paraspinal tissues: Preserved vascular flow voids in the neck. Negative neck soft tissues. Disc levels: Chronic disc and endplate degeneration at C3-C4 and C5-C6 with up to mild spinal stenosis at both levels. Chronic moderate to severe bilateral C4 neural foraminal stenosis. Chronic moderate left C6 neural foraminal stenosis. Cervical spine degeneration has not significantly progressed since 2013. IMPRESSION: 1. Relatively mild chronic cerebral white matter signal abnormality is stable, with no evidence of  acute or progressive intracranial demyelination. 2. 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 3. No new intracranial abnormality. 4. Chronic C3-C4 and C5-C6 spinal degeneration is stable since 2013. Electronically Signed   By: Odessa Fleming M.D.   On: 08/06/2017 09:01   Mr Cervical Spine W Wo Contrast  Result Date: 08/06/2017 CLINICAL DATA:  56 year old male with chronic multiple sclerosis. Recent increased whole-body weakness. EXAM: MRI HEAD WITHOUT AND WITH CONTRAST MRI CERVICAL SPINE WITHOUT AND WITH CONTRAST TECHNIQUE: Multiplanar, multiecho pulse sequences of the brain and surrounding structures, and cervical spine, to include the craniocervical junction and cervicothoracic junction, were obtained without and with intravenous contrast. CONTRAST:  15mL MULTIHANCE GADOBENATE DIMEGLUMINE 529 MG/ML IV SOLN COMPARISON:  Head CT without contrast 05/07/2017. Brain MRI 03/18/2017, cervical spine MRI 10/04/2014 and 03/31/2012 FINDINGS: MRI HEAD FINDINGS Brain: Stable cerebral volume. No restricted diffusion to suggest acute infarction. No midline shift, mass effect, evidence of mass lesion, ventriculomegaly, extra-axial collection or acute intracranial hemorrhage. Cervicomedullary junction and pituitary are within  normal limits. Cerebral white matter signal abnormality in the form of scattered small foci, without a periventricular prep collection. Frontal lobe central and subcortical white matter appears most affected. Lesser temporal lobe involvement, mostly on the left. No corpus callosum involvement. No cortical involvement or cortical encephalomalacia identified. Bilateral deep gray matter nuclei, brainstem, and cerebellum remain spared. No progression since May. No abnormal enhancement identified. No restricted lesions. Vascular: Major intracranial vascular flow voids are stable. Skull and upper cervical spine: Normal bone marrow signal. Sinuses/Orbits: Stable orbits soft tissues. Paranasal sinuses and mastoids are stable and well pneumatized. Other: Visible internal auditory structures appear normal. MRI CERVICAL SPINE FINDINGS Alignment: Straightening of cervical lordosis. Vertebrae: No marrow edema or evidence of acute osseous abnormality. Cord: Due to significant recurrent motion artifact on cervical spine imaging since 2015, spinal cord lesions today are compared to 2013. Furthermore, the axial T2 and T2* imaging today is degraded by motion despite repeated imaging attempts. Significant STIR heterogeneity throughout the cervical spinal cord with patchy hyperintense cord lesions at the dorsal C2-C3 level, left dorsal C4-C5 level, right dorsal C6 level, and ventral C6-C7 level. See series 16, image 8. This appears progressed since 2013, although patchy cord heterogeneity was present at that time. There is no associated spinal cord enhancement or abnormal intradural enhancement or dural thickening. Posterior Fossa, vertebral arteries, paraspinal tissues: Preserved vascular flow voids in the neck. Negative neck soft tissues. Disc levels: Chronic disc and endplate degeneration at C3-C4 and C5-C6 with up to mild spinal stenosis at both levels. Chronic moderate to severe bilateral C4 neural foraminal stenosis. Chronic  moderate left C6 neural foraminal stenosis. Cervical spine degeneration has not significantly progressed since 2013. IMPRESSION: 1. Relatively mild chronic cerebral white matter signal abnormality is stable, with no evidence of acute or progressive intracranial demyelination. 2. 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 3. No new intracranial abnormality. 4. Chronic C3-C4 and C5-C6 spinal degeneration is stable since 2013. Electronically Signed   By: Odessa Fleming M.D.   On: 08/06/2017 09:01   Mr Thoracic Spine W Wo Contrast  Result Date: 08/08/2017 CLINICAL DATA:  Muscle weakness. Multiple sclerosis. Chronic pain syndrome. EXAM: MRI THORACIC AND LUMBAR SPINE WITHOUT AND WITH CONTRAST TECHNIQUE: Multiplanar and multiecho pulse sequences of the thoracic and lumbar spine were obtained without and with intravenous contrast. CONTRAST:  20 ml MultiHance. COMPARISON:  Cervical MRI 08/06/2017, chest CT 11/15/2016. Thoracic MRI 12/13/2005. FINDINGS: MRI THORACIC SPINE FINDINGS Alignment: There is a stable S-shaped scoliosis of the upper to mid thoracic spine. The lateral alignment is normal. Vertebrae: No vertebral anomaly or acute osseous findings. Cord: There is mild T2 hyperintensity in the right aspect of the cord at the T1-2 level. No other definite abnormal cord signal demonstrated. There is questionable T2 hyperintensity in the cord at the T12-L1 level on the sagittal inversion recovery images through the lumbar spine (image 80 of series 6). There is no abnormal cord enhancement. Cord evaluation is mildly limited by motion artifact. Paraspinal and other soft tissues: No paraspinal abnormalities are seen. Disc levels: No large disc herniation, spinal stenosis or nerve root encroachment. There is mild endplate osteophyte formation in the thoracic spine. There is mild disc bulging at C5-6. MRI LUMBAR SPINE FINDINGS  Segmentation: Counted from above, there is transitional lumbosacral anatomy with a nearly fully lumbarized S1 segment. By this numbering, there is an old healed fracture of the L1 vertebral body which appears stable. Alignment:  Normal. Vertebrae: As above, transitional lumbosacral anatomy with a transitional S1 segment. Old L1 fracture is healed without osseous retropulsion. No worrisome osseous findings. Conus medullaris: Extends to the L2 level and appears normal. No abnormal intradural enhancement. Paraspinal and other soft tissues: No paraspinal abnormalities are seen. There are small renal cysts bilaterally. Disc levels: The lumbar discs are well hydrated with maintained height. There is no disc herniation, spinal stenosis or nerve root encroachment. Mild facet and ligamentous hypertrophy is present at the L4-5 and L5-S1 levels. There is no resulting foraminal compromise. IMPRESSION: 1. As seen on recent cervical MRI, there is mild T2 hyperintensity in the cord on the right at T1-2 consistent with demyelination. Questionable demyelinating lesion at T12- L1. 2. No other demyelinating lesions identified in the cord. No cord enlargement or abnormal enhancement. 3. No disc herniation, spinal stenosis or nerve root encroachment. 4. 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 Electronically Signed   By: Carey Bullocks M.D.   On: 08/08/2017 14:46   Mr Lumbar Spine W Wo Contrast  Result Date: 08/08/2017 CLINICAL DATA:  Muscle weakness. Multiple sclerosis. Chronic pain syndrome. EXAM: MRI THORACIC AND LUMBAR SPINE WITHOUT AND WITH CONTRAST TECHNIQUE: Multiplanar and multiecho pulse sequences of the thoracic and lumbar spine were obtained without and with intravenous contrast. CONTRAST:  20 ml MultiHance. COMPARISON:  Cervical MRI 08/06/2017, chest CT 11/15/2016. Thoracic MRI 12/13/2005. FINDINGS: MRI THORACIC SPINE FINDINGS Alignment: There is a stable S-shaped scoliosis of the  upper to mid thoracic spine. The lateral alignment is normal. Vertebrae: No vertebral anomaly or acute osseous findings. Cord: There is mild T2 hyperintensity in the right aspect of the cord at the T1-2 level. No other definite abnormal cord signal demonstrated. There is questionable T2 hyperintensity in the cord at the T12-L1 level on the sagittal inversion recovery images through the lumbar spine (image 80 of series 6). There is no abnormal cord enhancement. Cord evaluation is mildly limited by motion artifact. Paraspinal and other soft tissues: No paraspinal abnormalities are seen. Disc levels: No large disc herniation, spinal stenosis or nerve root encroachment. There is mild endplate osteophyte formation in the thoracic spine. There is mild disc bulging at C5-6. MRI LUMBAR SPINE FINDINGS Segmentation: Counted from above, there is transitional lumbosacral anatomy with a nearly fully lumbarized S1 segment. By this numbering, there is an old healed fracture of the  L1 vertebral body which appears stable. Alignment:  Normal. Vertebrae: As above, transitional lumbosacral anatomy with a transitional S1 segment. Old L1 fracture is healed without osseous retropulsion. No worrisome osseous findings. Conus medullaris: Extends to the L2 level and appears normal. No abnormal intradural enhancement. Paraspinal and other soft tissues: No paraspinal abnormalities are seen. There are small renal cysts bilaterally. Disc levels: The lumbar discs are well hydrated with maintained height. There is no disc herniation, spinal stenosis or nerve root encroachment. Mild facet and ligamentous hypertrophy is present at the L4-5 and L5-S1 levels. There is no resulting foraminal compromise. IMPRESSION: 1. As seen on recent cervical MRI, there is mild T2 hyperintensity in the cord on the right at T1-2 consistent with demyelination. Questionable demyelinating lesion at T12- L1. 2. No other demyelinating lesions identified in the cord. No cord  enlargement or abnormal enhancement. 3. No disc herniation, spinal stenosis or nerve root encroachment. 4. 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 Electronically Signed   By: Carey Bullocks M.D.   On: 08/08/2017 14:46    Subjective: Seen and examined and felt better. Complained he had some dizziness but not as bad. No other concerns and complaints and headache resolved. Patient also stated he felt as if he was getting stronger. Ready to go home.  Discharge Exam: Vitals:   08/08/17 2143 08/09/17 0515  BP: (!) 147/81 (!) 150/72  Pulse: 79 (!) 50  Resp: 18 18  Temp: 98.3 F (36.8 C) 98.4 F (36.9 C)  SpO2: 94% 95%   Vitals:   08/08/17 0517 08/08/17 1510 08/08/17 2143 08/09/17 0515  BP: (!) 109/56 131/80 (!) 147/81 (!) 150/72  Pulse: (!) 59 76 79 (!) 50  Resp: Temp: 97.9 F (36.6 C) 98.1 F (36.7 C) 98.3 F (36.8 C) 98.4 F (36.9 C)  TempSrc: Oral Oral Oral Oral  SpO2: 98% 94% 94% 95%  Weight:      Height:       General: Pt is alert, awake, not in acute distress Cardiovascular: RRR, S1/S2 +, no rubs, no gallops Respiratory: CTA bilaterally, no wheezing, no rhonchi Abdominal: Soft, NT, ND, bowel sounds + Extremities: no edema, no cyanosis  The results of significant diagnostics from this hospitalization (including imaging, microbiology, ancillary and laboratory) are listed below for reference.    Microbiology: Recent Results (from the past 240 hour(s))  Culture, Urine     Status: None   Collection Time: 08/04/17  1:23 AM  Result Value Ref Range Status   Specimen Description URINE, CLEAN CATCH  Final   Special Requests NONE  Final   Culture NO GROWTH  Final   Report Status 08/06/2017 FINAL  Final  Culture, blood (routine x 2)     Status: None (Preliminary result)   Collection Time: 08/04/17  9:20 PM  Result Value Ref Range Status   Specimen Description BLOOD RIGHT ANTECUBITAL  Final   Special Requests   Final    BOTTLES  DRAWN AEROBIC AND ANAEROBIC Blood Culture adequate volume   Culture NO GROWTH 4 DAYS  Final   Report Status PENDING  Incomplete  Culture, blood (routine x 2)     Status: None (Preliminary result)   Collection Time: 08/04/17  9:27 PM  Result Value Ref Range Status   Specimen Description BLOOD RIGHT HAND  Final   Special Requests   Final    BOTTLES DRAWN AEROBIC AND ANAEROBIC Blood Culture adequate volume   Culture NO GROWTH  4 DAYS  Final   Report Status PENDING  Incomplete  MRSA PCR Screening     Status: None   Collection Time: 08/05/17  7:05 AM  Result Value Ref Range Status   MRSA by PCR NEGATIVE NEGATIVE Final    Comment:        The GeneXpert MRSA Assay (FDA approved for NASAL specimens only), is one component of a comprehensive MRSA colonization surveillance program. It is not intended to diagnose MRSA infection nor to guide or monitor treatment for MRSA infections.     Labs: BNP (last 3 results) No results for input(s): BNP in the last 8760 hours. Basic Metabolic Panel:  Recent Labs Lab 08/04/17 2120 08/05/17 0434 08/06/17 0615 08/07/17 0448 08/08/17 0315 08/08/17 1445 08/09/17 0321  NA  --  138 141 141 141  --  143  K  --  3.4* 3.8 4.3 4.7  --  4.1  CL  --  109 115* 115* 115*  --  114*  CO2  --  21* 21* 20* 20*  --  21*  GLUCOSE  --  100* 102* 160* 146*  --  147*  BUN  --  5* --  14  CREATININE  --  0.80 0.83 0.93 0.96 0.97 0.80  CALCIUM  --  8.7* 8.7* 9.0 8.9  --  8.8*  MG 2.3  --   --  2.0 2.1  --  2.1  PHOS  --   --   --  1.7* 2.6  --  2.5   Liver Function Tests:  Recent Labs Lab 08/04/17 1822 08/07/17 0448 08/08/17 0315 08/09/17 0321  AST ALT 17 14* 15* 19  ALKPHOS 81 70 67 73  BILITOT 0.7 0.3 0.5 0.3  PROT 6.7 6.1* 5.8* 6.3*  ALBUMIN 3.6 3.3* 3.2* 3.3*   No results for input(s): LIPASE, AMYLASE in the last 168 hours. No results for input(s): AMMONIA in the last 168 hours. CBC:  Recent Labs Lab 08/04/17 1822  08/05/17 0434 08/06/17 0615 08/07/17 0448 08/08/17 0315 08/09/17 0321  WBC 4.4 5.1 4.5 5.8 13.0* 10.0  NEUTROABS 2.6  --   --  5.3 12.0* 9.5*  HGB 13.8 13.4 12.5* 13.0 12.2* 13.4  HCT 42.0 41.1 38.4* 40.8 38.5* 41.9  MCV 80.3 80.0 81.0 81.1 81.7 80.6  PLT 253 243 248 241 240 254   Cardiac Enzymes: No results for input(s): CKTOTAL, CKMB, CKMBINDEX, TROPONINI in the last 168 hours. BNP: Invalid input(s): POCBNP CBG:  Recent Labs Lab 08/06/17 0752  GLUCAP 113*   D-Dimer No results for input(s): DDIMER in the last 72 hours. Hgb A1c No results for input(s): HGBA1C in the last 72 hours. Lipid Profile No results for input(s): CHOL, HDL, LDLCALC, TRIG, CHOLHDL, LDLDIRECT in the last 72 hours. Thyroid function studies No results for input(s): TSH, T4TOTAL, T3FREE, THYROIDAB in the last 72 hours.  Invalid input(s): FREET3 Anemia work up No results for input(s): VITAMINB12, FOLATE, FERRITIN, TIBC, IRON, RETICCTPCT in the last 72 hours. Urinalysis    Component Value Date/Time   COLORURINE YELLOW 08/05/2017 0123   APPEARANCEUR CLOUDY (A) 08/05/2017 0123   LABSPEC 1.011 08/05/2017 0123   PHURINE 7.0 08/05/2017 0123   GLUCOSEU NEGATIVE 08/05/2017 0123   HGBUR NEGATIVE 08/05/2017 0123   BILIRUBINUR NEGATIVE 08/05/2017 0123   KETONESUR NEGATIVE 08/05/2017 0123   PROTEINUR NEGATIVE 08/05/2017 0123   UROBILINOGEN 1.0 05/14/2014 2113   NITRITE NEGATIVE 08/05/2017 0123   LEUKOCYTESUR NEGATIVE  08/05/2017 0123   Sepsis Labs Invalid input(s): PROCALCITONIN,  WBC,  LACTICIDVEN Microbiology Recent Results (from the past 240 hour(s))  Culture, Urine     Status: None   Collection Time: 08/04/17  1:23 AM  Result Value Ref Range Status   Specimen Description URINE, CLEAN CATCH  Final   Special Requests NONE  Final   Culture NO GROWTH  Final   Report Status 08/06/2017 FINAL  Final  Culture, blood (routine x 2)     Status: None (Preliminary result)   Collection Time: 08/04/17  9:20 PM   Result Value Ref Range Status   Specimen Description BLOOD RIGHT ANTECUBITAL  Final   Special Requests   Final    BOTTLES DRAWN AEROBIC AND ANAEROBIC Blood Culture adequate volume   Culture NO GROWTH 4 DAYS  Final   Report Status PENDING  Incomplete  Culture, blood (routine x 2)     Status: None (Preliminary result)   Collection Time: 08/04/17  9:27 PM  Result Value Ref Range Status   Specimen Description BLOOD RIGHT HAND  Final   Special Requests   Final    BOTTLES DRAWN AEROBIC AND ANAEROBIC Blood Culture adequate volume   Culture NO GROWTH 4 DAYS  Final   Report Status PENDING  Incomplete  MRSA PCR Screening     Status: None   Collection Time: 08/05/17  7:05 AM  Result Value Ref Range Status   MRSA by PCR NEGATIVE NEGATIVE Final    Comment:        The GeneXpert MRSA Assay (FDA approved for NASAL specimens only), is one component of a comprehensive MRSA colonization surveillance program. It is not intended to diagnose MRSA infection nor to guide or monitor treatment for MRSA infections.    Time coordinating discharge: 35 minutes  SIGNED:  Merlene Laughter, DO [facility] 08/09/2017, 12:38 PM Pager [phone]  If 7PM-7AM, please contact night-coverage [email or web address] Password [email or web address]

## 2017-08-09 NOTE — Progress Notes (Signed)
Subjective: Hhe feels significantly better today.   Exam: Vitals:   08/08/17 2143 08/09/17 0515  BP: (!) 147/81 (!) 150/72  Pulse: 79 (!) 50  Resp: 18 18  Temp: 98.3 F (36.8 C) 98.4 F (36.9 C)  SpO2: 94% 95%   Gen: In bed, NAD Resp: non-labored breathing, no acute distress Abd: soft, nt  Neuro: MS: Awake, alert, interactive and appropriate. CN: Pupils equal round and reactive to light, extra movements intact. Visual fields full. Motor: 4/5 in his uppers, he has very poor effort in his lower extremities, but at least 4/5 throughout   Impression: 56 year old male with generalized weakness, headaches. Description of the headache and associated symptoms of vision change, waxing and waning confusion, unilateral photophobic and throbbing character are all supportive of migraine. He has a history of headaches including migraines.  With no enhancement, I don't strongly suspect that this wasn't MS exacerbation. I think there may be some degree of recrudescence due to his headaches.  He had significant improvements from DHE. He may gain some benefit from low-dose tricyclic.  Recommendations: 1) Nortriptyline 25mg  QHS 2) no further recommendations at this time, please call if further questions or concerns.  Ritta Slot, MD [facility] [phone]  If 7pm- 7am, please page neurology on call as listed in AMION.

## 2017-08-09 NOTE — Progress Notes (Signed)
[place] accessed, patient awaiting family member to discharge

## 2017-08-09 NOTE — Care Management Note (Signed)
Case Management Note  Patient Details  Name: [name] MRN: [id number] Date of Birth: 01-24-1961  Subjective/Objective:   56 y.o. With MS who is basically w/c bound, tells me he has had [facility] in the past. Tells me he would prefer to just continue with the exercises that he is already doing vs beginning a new PT program. No other needs identified at this time.                  Action/Plan:CM will sign off for now but will be available should additional discharge needs arise or disposition change.    Expected Discharge Date:  08/09/17               Expected Discharge Plan:     In-House Referral:     Discharge planning Services  CM Consult  Post Acute Care Choice:  NA Choice offered to:  Patient  DME Arranged:    DME Agency:     HH Arranged:  Patient Refused HH, PT HH Agency:     Status of Service:  Completed, signed off  If discussed at Long Length of Stay Meetings, dates discussed:    Additional Comments:  Yvone Neu, RN 08/09/2017, 12:51 PM

## 2017-08-09 NOTE — Progress Notes (Signed)
VC 1.82L  And NIF -25cmH2)

## 2017-08-09 NOTE — Progress Notes (Signed)
Discharge order in, contacting care management to reconnect home health needs.

## 2017-08-14 ENCOUNTER — Ambulatory Visit: Payer: Self-pay

## 2017-09-03 ENCOUNTER — Ambulatory Visit (INDEPENDENT_AMBULATORY_CARE_PROVIDER_SITE_OTHER): Payer: Medicare Other | Admitting: Pharmacist Clinician (PhC)/ Clinical Pharmacy Specialist

## 2017-09-03 DIAGNOSIS — B182 Chronic viral hepatitis C: Secondary | ICD-10-CM

## 2017-09-03 NOTE — Patient Instructions (Signed)
Come back and see [place] in January. Hopefully, it will be the last visit with [place].

## 2017-09-03 NOTE — Progress Notes (Signed)
Agreed with Erin's note. If his VL is neg today, he is likely cured but we'll bring him back on more time in 3 months.  Ulyses SouthwardMinh Pham, PharmD, BCPS, AAHIVP, CPP Infectious Disease Pharmacist Pager: [phone] 09/03/2017 3:46 PM

## 2017-09-03 NOTE — Progress Notes (Addendum)
HPI: Gary DaviesJonas Perez is a 56 y.o. male presenting to the [facility] pharmacy clinic for his hepatitis C SVR12 visit. He has genotype 2b, F4, and completed 12 weeks of Vosevi.  Lab Results  Component Value Date   HCVGENOTYPE 2b 07/08/2016    Allergies: No Known Allergies  Past Medical History: Past Medical History:  Diagnosis Date  . Abdominal pain, unspecified site   . Anxiety   . Arthritis   . Benign paroxysmal positional vertigo   . Cellulitis and abscess of right leg 04/2017  . Chronic back pain   . Chronic pain syndrome 01/25/2008  . Cluster headache   . Depression    takes Zoloft daily  . DVT (deep venous thrombosis) (HCC)    in the left arm '09  . Gait abnormality    "uses mobile wheelchair, but is ambulatory"  . Gallstones 02/17/2009   resolved after gallbladder surgery.  [name]. GERD (gastroesophageal reflux disease)    takes Omeprazole as needed  . Headache(784.0)    cluster headaches frequently-takes Topamax daily  . History of colonoscopy   . HTN (hypertension)    takes Lisinopril,Verapamil,and Triamterene HCTZ daily  . Insomnia 11/06/2008  . Joint pain   . Joint swelling    03-07-16 "swelling of right wrist" "after a fall-xray done 03-06-16 "no fractures".  . Memory loss    no an issue at present 03-07-16  . Multiple sclerosis (HCC)    Dx. 2005 - Dr. Tinnie GensJeffrey follows LOV 4'17 tx. Tysabri monthly IV-[name] , [place].  [name]. Nonspecific elevation of levels of transaminase or lactic acid dehydrogenase (LDH)   . Other specified visual disturbances   . Other syndromes affecting cervical region   . Pneumonia 2009  . Trigeminal neuralgia     history" Multiple sclerosis"    Social History: Social History   Social History  . Marital status: Divorced    Spouse name: N/A  . Number of children: 3  . Years of education: 10511 th   Occupational History  .      Disabled   Social History Main Topics  . Smoking status: Former Smoker    Packs/day: 1.00     Years: 38.00    Types: Cigarettes  . Smokeless tobacco: Never Used     Comment: cutting back  . Alcohol use 0.0 oz/week     Comment: occasional  . Drug use: No     Comment: Quit 2011  . Sexual activity: Not on file   Other Topics Concern  . Not on file   Social History Narrative   Patient is divorced. Patient is disabled. Because of his MS. Patient has 11 th grade education. 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    Right handed.   Caffeine- One daily.    Labs: Hepatitis B Surface Ag (no units)  Date Value  05/28/2016 Negative   HCV Ab (no units)  Date Value  07/08/2016 REACTIVE (A)    Lab Results  Component Value Date   HCVGENOTYPE 2b 07/08/2016    Hepatitis C RNA quantitative Latest Ref Rng & Units 07/21/2017 06/04/2017 05/11/2017 04/08/2017 12/30/2016  HCV Quantitative NOT DETECTED IU/mL - <15 NOT DETECTED HCV Not Detected <15 NOT DETECTED 17,600,000(H)  HCV Quantitative Log NOT DETECT Log IU/mL <1.18 NOT DETECTED <1.18 NOT DETECTED - <1.18 NOT DETECTED 7.25(H)    AST (U/L)  Date Value  08/09/2017 15  08/08/2017 18  08/07/2017 18   ALT (U/L)  Date  Value  08/09/2017 19  08/08/2017 15 (L)  08/07/2017 14 (L)  09/05/2016 40   INR (no units)  Date Value  12/19/2016 1.33  12/18/2016 1.40  12/17/2016 1.97    CrCl: CrCl cannot be calculated (Patient's most recent lab result is older than the maximum 21 days allowed.).  Fibrosis Score: F4 as assessed by Fibrosure   Child-Pugh Score: A   Previous Treatment Regimen: Epclusa x12 wks  Assessment: [name] is here today for his hepatitis C SVR12 visit. He does not have any issues to report. We will get an HCV VL today. Because [name] has failed Epclusa prior to treatment with Vosevi, we will bring him back for an SVR24 visit to confirm cure.   Recommendations: - HCV RNA today - F/u appt on 1/24 @ 1500 for cure visit and 2nd hepA shot  [name], PharmD PGY1 Pharmacy  Resident Pager: [phone] 09/03/2017, 3:13 PM

## 2017-09-05 LAB — HEPATITIS C RNA QUANTITATIVE
HCV QUANT LOG: NOT DETECTED {Log_IU}/mL
HCV RNA, PCR, QN: NOT DETECTED [IU]/mL

## 2017-09-08 ENCOUNTER — Telehealth: Payer: Self-pay | Admitting: Pharmacist Clinician (PhC)/ Clinical Pharmacy Specialist

## 2017-09-08 NOTE — Telephone Encounter (Signed)
[facility] We will just do a SVR24 to confirm in Feb. He's likely cured.

## 2017-09-11 ENCOUNTER — Ambulatory Visit: Payer: Self-pay

## 2017-09-12 DIAGNOSIS — R531 Weakness: Secondary | ICD-10-CM

## 2017-10-03 ENCOUNTER — Emergency Department (HOSPITAL_COMMUNITY)
Admission: EM | Admit: 2017-10-03 | Discharge: 2017-10-04 | Disposition: A | Discharge status: Other Acute Inpatient Hospital | Payer: 59 | Attending: Emergency Medicine | Admitting: Emergency Medicine

## 2017-10-03 ENCOUNTER — Emergency Department (HOSPITAL_COMMUNITY): Payer: 59

## 2017-10-03 ENCOUNTER — Encounter (HOSPITAL_COMMUNITY): Payer: Self-pay

## 2017-10-03 DIAGNOSIS — Z87891 Personal history of nicotine dependence: Secondary | ICD-10-CM | POA: Insufficient documentation

## 2017-10-03 DIAGNOSIS — I10 Essential (primary) hypertension: Secondary | ICD-10-CM | POA: Insufficient documentation

## 2017-10-03 DIAGNOSIS — R69 Illness, unspecified: Secondary | ICD-10-CM | POA: Diagnosis not present

## 2017-10-03 DIAGNOSIS — F323 Major depressive disorder, single episode, severe with psychotic features: Secondary | ICD-10-CM | POA: Insufficient documentation

## 2017-10-03 DIAGNOSIS — J111 Influenza due to unidentified influenza virus with other respiratory manifestations: Secondary | ICD-10-CM

## 2017-10-03 DIAGNOSIS — R45851 Suicidal ideations: Secondary | ICD-10-CM | POA: Diagnosis not present

## 2017-10-03 DIAGNOSIS — F329 Major depressive disorder, single episode, unspecified: Secondary | ICD-10-CM | POA: Diagnosis present

## 2017-10-03 DIAGNOSIS — Z79899 Other long term (current) drug therapy: Secondary | ICD-10-CM | POA: Diagnosis not present

## 2017-10-03 LAB — COMPREHENSIVE METABOLIC PANEL
ALT: 13 U/L — ABNORMAL LOW (ref 17–63)
ANION GAP: 7 (ref 5–15)
AST: 16 U/L (ref 15–41)
Albumin: 3.8 g/dL (ref 3.5–5.0)
Alkaline Phosphatase: 87 U/L (ref 38–126)
BUN: 9 mg/dL (ref 6–20)
CHLORIDE: 112 mmol/L — AB (ref 101–111)
CO2: 21 mmol/L — AB (ref 22–32)
Calcium: 8.8 mg/dL — ABNORMAL LOW (ref 8.9–10.3)
Creatinine, Ser: 0.81 mg/dL (ref 0.61–1.24)
GFR calc non Af Amer: >60 mL/min (ref >60)
Glucose, Bld: 96 mg/dL (ref 65–99)
POTASSIUM: 3 mmol/L — AB (ref 3.5–5.1)
SODIUM: 140 mmol/L (ref 135–145)
Total Bilirubin: 0.5 mg/dL (ref 0.3–1.2)
Total Protein: 6.9 g/dL (ref 6.5–8.1)

## 2017-10-03 LAB — CBC WITH DIFFERENTIAL/PLATELET
Basophils Absolute: 0 10*3/uL (ref 0.0–0.1)
Basophils Relative: 0 %
EOS ABS: 0.1 10*3/uL (ref 0.0–0.7)
EOS PCT: 2 %
HCT: 39.1 % (ref 39.0–52.0)
Hemoglobin: 12.8 g/dL — ABNORMAL LOW (ref 13.0–17.0)
LYMPHS ABS: 2.3 10*3/uL (ref 0.7–4.0)
Lymphocytes Relative: 38 %
MCH: 26.8 pg (ref 26.0–34.0)
MCHC: 32.7 g/dL (ref 30.0–36.0)
MCV: 82 fL (ref 78.0–100.0)
MONOS PCT: 7 %
Monocytes Absolute: 0.5 10*3/uL (ref 0.1–1.0)
Neutro Abs: 3.2 10*3/uL (ref 1.7–7.7)
Neutrophils Relative %: 53 %
PLATELETS: 236 10*3/uL (ref 150–400)
RBC: 4.77 MIL/uL (ref 4.22–5.81)
RDW: 16.9 % — ABNORMAL HIGH (ref 11.5–15.5)
WBC: 6.1 10*3/uL (ref 4.0–10.5)

## 2017-10-03 LAB — I-STAT CHEM 8, ED
BUN: 8 mg/dL (ref 6–20)
CALCIUM ION: 1.2 mmol/L (ref 1.15–1.40)
CHLORIDE: 111 mmol/L (ref 101–111)
Creatinine, Ser: 0.8 mg/dL (ref 0.61–1.24)
GLUCOSE: 92 mg/dL (ref 65–99)
HCT: 40 % (ref 39.0–52.0)
Hemoglobin: 13.6 g/dL (ref 13.0–17.0)
Potassium: 3 mmol/L — ABNORMAL LOW (ref 3.5–5.1)
SODIUM: 145 mmol/L (ref 135–145)
TCO2: 20 mmol/L — ABNORMAL LOW (ref 22–32)

## 2017-10-03 MED ORDER — SODIUM CHLORIDE 0.9 % IV BOLUS (SEPSIS)
1000.0000 mL | Freq: Once | INTRAVENOUS | Status: AC
  Administered 2017-10-03: 1000 mL via INTRAVENOUS

## 2017-10-03 NOTE — ED Notes (Signed)
Patient transported to X-ray 

## 2017-10-03 NOTE — ED Triage Notes (Signed)
Patient arrives from home by [facility] with complaints of MS-generalized pain and weakness for the past 3 days. EMS reports patient taking his medications with no relief. Pain 9/10 generalized. BP 116/70 HR 70 RR 18-20 O2 sat 95% RA

## 2017-10-03 NOTE — ED Notes (Signed)
Bed: YT11 Expected date:  Expected time:  Means of arrival:  Comments: EMS 56 yo male with exacerbation MS-pain all over body/generalized weakness

## 2017-10-03 NOTE — ED Provider Notes (Addendum)
[facility]-EMERGENCY DEPT Provider Note   CSN: [id number] Arrival date & time: 10/03/17  1956     History   Chief Complaint Chief Complaint  Patient presents with  . Multiple Sclerosis  . Generalized pain and weakness    HPI [name] is a 56 y.o. male.  56 yo M with a chief complaint of cough congestion and diffuse body aches.  This been going on for the past couple days.  Patient thinks is related to his multiple sclerosis.  He has had an issue like this previously.  He said it resolved with steroids.  Denies sick contacts.  Feels generally weak all over.  As I was leaving the room the patient told me that he was thinking about killing himself.  He was very vague in his plan.  He states he will do whatever it takes.  Denies trying to kill himself before.   The history is provided by the patient.  Illness  This is a new problem. The current episode started 2 days ago. The problem occurs constantly. The problem has been gradually worsening. Pertinent negatives include no chest pain, no abdominal pain, no headaches and no shortness of breath. Nothing aggravates the symptoms. Nothing relieves the symptoms. He has tried nothing for the symptoms. The treatment provided no relief.    Past Medical History:  Diagnosis Date  . Abdominal pain, unspecified site   . Anxiety   . Arthritis   . Benign paroxysmal positional vertigo   . Cellulitis and abscess of right leg 04/2017  . Chronic back pain   . Chronic pain syndrome 01/25/2008  . Cluster headache   . Depression    takes Zoloft daily  . DVT (deep venous thrombosis) (HCC)    in the left arm '09  . Gait abnormality    "uses mobile wheelchair, but is ambulatory"  . Gallstones 02/17/2009   resolved after gallbladder surgery.  [name]. GERD (gastroesophageal reflux disease)    takes Omeprazole as needed  . Headache(784.0)    cluster headaches frequently-takes Topamax daily  . History of colonoscopy   . HTN  (hypertension)    takes Lisinopril,Verapamil,and Triamterene HCTZ daily  . Insomnia 11/06/2008  . Joint pain   . Joint swelling    03-07-16 "swelling of right wrist" "after a fall-xray done 03-06-16 "no fractures".  . Memory loss    no an issue at present 03-07-16  . Multiple sclerosis (HCC)    Dx. 2005 - Dr. Tinnie GensJeffrey follows LOV 4'17 tx. Tysabri monthly IV-[facility] , [place].  [name]. Nonspecific elevation of levels of transaminase or lactic acid dehydrogenase (LDH)   . Other specified visual disturbances   . Other syndromes affecting cervical region   . Pneumonia 2009  . Trigeminal neuralgia     history" Multiple sclerosis"    Patient Active Problem List   Diagnosis Date Noted  . Weakness   . Cellulitis of right leg 05/07/2017  . Right carpal tunnel syndrome 02/16/2017  . Gait disturbance 01/22/2017  . Insomnia 01/22/2017  . Depression with anxiety 01/22/2017  . High risk medication use 01/22/2017  . Major depressive disorder, recurrent episode (HCC) 12/19/2016  . Suicidal ideation   . AKI (acute kidney injury) (HCC) 11/15/2016  . Chronic hepatitis C virus infection (HCC) 11/15/2016  . Chronic abdominal pain 11/15/2016  . Chronic pain syndrome 11/15/2016  . Acute retention of urine 11/15/2016  . Generalized abdominal pain   . Chronic cluster headache, not intractable   .  Community acquired pneumonia   . TB lung, latent   . HCAP (healthcare-associated pneumonia) 02/16/2016  . Hypokalemia 02/16/2016  . Intractable cluster headache syndrome   . DVT (deep venous thrombosis) (HCC)   . Depression   . GERD (gastroesophageal reflux disease)   . HTN (hypertension)   . Essential hypertension   . Gastroesophageal reflux disease without esophagitis   . CAP (community acquired pneumonia)   . Multiple sclerosis (HCC) 08/02/2013  . ABDOMINAL BLOATING 10/14/2010  . LOOSE STOOLS 10/14/2010  . PULMONARY EMBOLISM, HX OF 10/14/2010    Past Surgical History:  Procedure  Laterality Date  . CHOLECYSTECTOMY  02/20/2009  . COLONOSCOPY WITH PROPOFOL N/A 03/17/2016   Procedure: COLONOSCOPY WITH PROPOFOL;  Surgeon: [name], MD;  Location: [facility];  Service: Endoscopy;  Laterality: N/A;  . PORT A CATH REVISION N/A 07/06/2015   Procedure: Removal and replacement of PORT A CATH;  Surgeon: [name], MD;  Location: [facility] OR;  Service: General;  Laterality: N/A;  . PORTACATH PLACEMENT N/A 03/27/2014   Procedure: INSERTION PORT-A-CATH;  Surgeon: [name], MD;  Location: [facility] OR;  Service: General;  Laterality: N/A;       Home Medications    Prior to Admission medications   Medication Sig Start Date End Date Taking? Authorizing Provider  baclofen (LIORESAL) 20 MG tablet Take 20 mg by mouth 4 (four) times daily.    Yes [provider]  dalfampridine 10 MG TB12 Take 1 tablet (10 mg total) by mouth 2 (two) times daily. 02/16/17  Yes Sater, Pearletha Furl, MD  dicyclomine (BENTYL) 20 MG tablet Take 20 mg by mouth every 6 (six) hours as needed for spasms.  07/21/17  Yes [provider]  Eszopiclone (ESZOPICLONE) 3 MG TABS Take 3 mg by mouth at bedtime as needed (for sleep).    Yes [provider]  gabapentin (NEURONTIN) 600 MG tablet Take 600 mg by mouth 4 (four) times daily.    Yes [provider]  KLOR-CON M20 20 MEQ tablet Take 20 mEq by mouth daily. 07/29/17  Yes [provider]  nortriptyline (PAMELOR) 25 MG capsule Take 1 capsule (25 mg total) by mouth at bedtime. 08/09/17  Yes Sheikh, Omair Latif, DO  omeprazole (PRILOSEC) 40 MG capsule Take 40 mg by mouth every evening.  10/20/16  Yes [provider]  Oxcarbazepine (TRILEPTAL) 300 MG tablet Take 300 mg by mouth 3 (three) times daily.   Yes [provider]  oxyCODONE-acetaminophen (PERCOCET) 10-325 MG tablet Take 1 tablet by mouth every 4 (four) hours as needed for pain. 05/11/17  Yes Richarda Overlie, MD  sertraline (ZOLOFT) 50 MG tablet Take 50 mg by  mouth daily.    Yes [provider]  topiramate (TOPAMAX) 25 MG tablet Take 1 tablet (25 mg total) by mouth 2 (two) times daily. Patient taking differently: Take 25-50 mg by mouth See admin instructions. Take 2 tablets every morning and at night then take 1 tablet at 3 pm 12/19/16  Yes Mancel Bale, MD  triamterene-hydrochlorothiazide (DYAZIDE) 37.5-25 MG capsule Take 1 capsule by mouth daily. 09/14/17  Yes [provider]  verapamil (CALAN) 80 MG tablet Take 80 mg by mouth 2 (two) times daily.   Yes [provider]  XARELTO 10 MG TABS tablet Take 10 mg by mouth daily. 04/09/17  Yes [provider]    Family History Family History  Problem Relation Age of Onset  . Cancer Father   . Diabetes Mother  Social History Social History   Tobacco Use  . Smoking status: Former Smoker    Packs/day: 1.00    Years: 38.00    Pack years: 38.00    Types: Cigarettes  . Smokeless tobacco: Never Used  . Tobacco comment: cutting back  Substance Use Topics  . Alcohol use: Yes    Alcohol/week: 0.0 oz    Comment: occasional  . Drug use: No    Comment: Quit 2011     Allergies   Patient has no known allergies.   Review of Systems Review of Systems  Constitutional: Negative for chills and fever.  HENT: Positive for congestion. Negative for facial swelling.   Eyes: Negative for discharge and visual disturbance.  Respiratory: Positive for cough. Negative for shortness of breath.   Cardiovascular: Negative for chest pain and palpitations.  Gastrointestinal: Negative for abdominal pain, diarrhea and vomiting.  Musculoskeletal: Positive for arthralgias. Negative for myalgias.  Skin: Negative for color change and rash.  Neurological: Negative for tremors, syncope and headaches.  Psychiatric/Behavioral: Positive for dysphoric mood and suicidal ideas. Negative for confusion.     Physical Exam Updated Vital Signs BP 125/83   Pulse 64   Temp 97.6 F (36.4  C)   Resp 18   Ht 5\' 9"  (1.753 m)   Wt 83.5 kg (184 lb)   SpO2 91%   BMI 27.17 kg/m   Physical Exam  Constitutional: He is oriented to person, place, and time. He appears well-developed and well-nourished.  HENT:  Head: Normocephalic and atraumatic.  Swollen turbinates, posterior nasal drip, no noted sinus ttp, tm normal bilaterally.    Eyes: EOM are normal. Pupils are equal, round, and reactive to light.  Neck: Normal range of motion. Neck supple. No JVD present.  Cardiovascular: Normal rate and regular rhythm. Exam reveals no gallop and no friction rub.  No murmur heard. Pulmonary/Chest: No respiratory distress. He has no wheezes.  Abdominal: He exhibits no distension. There is no rebound and no guarding.  Musculoskeletal: Normal range of motion.  Neurological: He is alert and oriented to person, place, and time. No cranial nerve deficit or sensory deficit. He displays a negative Romberg sign. Coordination and gait normal. GCS eye subscore is 4. GCS verbal subscore is 5. GCS motor subscore is 6.  Right lower extremity 4 out of 5 versus left lower extremity 5 out of 5.  Chronic finding for him.  Skin: No rash noted. No pallor.  Psychiatric: He has a normal mood and affect. His behavior is normal. He expresses suicidal ideation. He expresses no homicidal ideation. He expresses suicidal plans. He expresses no homicidal plans.  Nursing note and vitals reviewed.    ED Treatments / Results  Labs (all labs ordered are listed, but only abnormal results are displayed) Labs Reviewed  CBC WITH DIFFERENTIAL/PLATELET - Abnormal; Notable for the following components:      Result Value   Hemoglobin 12.8 (*)    RDW 16.9 (*)    All other components within normal limits  COMPREHENSIVE METABOLIC PANEL - Abnormal; Notable for the following components:   Potassium 3.0 (*)    Chloride 112 (*)    CO2 21 (*)    Calcium 8.8 (*)    ALT 13 (*)    All other components within normal limits  I-STAT  CHEM 8, ED - Abnormal; Notable for the following components:   Potassium 3.0 (*)    TCO2 20 (*)    All other components within normal limits  URINALYSIS, ROUTINE W REFLEX MICROSCOPIC  INFLUENZA PANEL BY PCR (TYPE A & B)    EKG  EKG Interpretation None       Radiology Dg Chest 2 View  Result Date: 10/03/2017 CLINICAL DATA:  Central chest pain. EXAM: CHEST  2 VIEW COMPARISON:  08/04/2017 FINDINGS: The heart size and mediastinal contours are within normal limits. Port catheter tip is seen in the distal SVC. Both lungs are clear. The visualized skeletal structures are unremarkable. IMPRESSION: No active cardiopulmonary disease. Electronically Signed   By: Tollie Eth M.D.   On: 10/03/2017 21:17    Procedures Procedures (including critical care time)  Medications Ordered in ED Medications  sodium chloride 0.9 % bolus 1,000 mL (1,000 mLs Intravenous New Bag/Given 10/03/17 2134)     Initial Impression / Assessment and Plan / ED Course  I have reviewed the triage vital signs and the nursing notes.  Pertinent labs & imaging results that were available during my care of the patient were reviewed by me and considered in my medical decision making (see chart for details).     56 yo M with a chief complaint of cough congestion and diffuse myalgias.  Sounds like influenza.  I discussed this with the patient who is adamant that it must be has multiple sclerosis.  No new neurologic findings.  Patient also mentioned that he is suicidal and is actively so.  I will obtain labs.  Discussed with neurology, Dr. Laurence Slate.  He felt that infection was probably more likely culprit, and feels that MS is much less likely a etiology.  Suggested infectious workup.    I feel the patient is medically clear.  TTS eval.    The patients results and plan were reviewed and discussed.   Any x-rays performed were independently reviewed by myself.   Differential diagnosis were considered with the presenting  HPI.  Medications  sodium chloride 0.9 % bolus 1,000 mL (1,000 mLs Intravenous New Bag/Given 10/03/17 2134)    Vitals:   10/03/17 2100 10/03/17 2130 10/03/17 2230 10/03/17 2300  BP: 114/82 111/77 125/85 125/83  Pulse: 65 67 65 64  Resp:      Temp:      SpO2: 93% 96% 92% 91%  Weight:      Height:        Final diagnoses:  Influenza-like illness  Suicidal ideation    Admission/ observation were discussed with the admitting physician, patient and/or family and they are comfortable with the plan.     Final Clinical Impressions(s) / ED Diagnoses   Final diagnoses:  Influenza-like illness  Suicidal ideation    ED Discharge Orders    None       Melene Plan, DO 10/03/17 2334    Melene Plan, DO 10/03/17 2341

## 2017-10-03 NOTE — ED Notes (Signed)
Pt has a urinal at bedside and stated "I can probably give one within the hour".

## 2017-10-03 NOTE — ED Notes (Signed)
Pt tried to give sample and was unable

## 2017-10-04 ENCOUNTER — Encounter (HOSPITAL_COMMUNITY): Payer: Self-pay

## 2017-10-04 ENCOUNTER — Other Ambulatory Visit: Payer: Self-pay

## 2017-10-04 ENCOUNTER — Inpatient Hospital Stay (HOSPITAL_COMMUNITY)
Admission: AD | Admit: 2017-10-04 | Discharge: 2017-10-09 | DRG: 885 | Disposition: A | Discharge status: Home / Self Care | Payer: 59 | Source: Intra-hospital | Attending: Psychiatry | Admitting: Psychiatry

## 2017-10-04 DIAGNOSIS — F39 Unspecified mood [affective] disorder: Secondary | ICD-10-CM | POA: Diagnosis not present

## 2017-10-04 DIAGNOSIS — R443 Hallucinations, unspecified: Secondary | ICD-10-CM | POA: Diagnosis not present

## 2017-10-04 DIAGNOSIS — G35 Multiple sclerosis: Secondary | ICD-10-CM | POA: Diagnosis present

## 2017-10-04 DIAGNOSIS — G4700 Insomnia, unspecified: Secondary | ICD-10-CM | POA: Diagnosis present

## 2017-10-04 DIAGNOSIS — R45851 Suicidal ideations: Secondary | ICD-10-CM | POA: Diagnosis present

## 2017-10-04 DIAGNOSIS — Z87891 Personal history of nicotine dependence: Secondary | ICD-10-CM | POA: Diagnosis not present

## 2017-10-04 DIAGNOSIS — R4582 Worries: Secondary | ICD-10-CM

## 2017-10-04 DIAGNOSIS — Z79899 Other long term (current) drug therapy: Secondary | ICD-10-CM

## 2017-10-04 DIAGNOSIS — K219 Gastro-esophageal reflux disease without esophagitis: Secondary | ICD-10-CM | POA: Diagnosis present

## 2017-10-04 DIAGNOSIS — Z86718 Personal history of other venous thrombosis and embolism: Secondary | ICD-10-CM

## 2017-10-04 DIAGNOSIS — F323 Major depressive disorder, single episode, severe with psychotic features: Principal | ICD-10-CM | POA: Diagnosis present

## 2017-10-04 DIAGNOSIS — F419 Anxiety disorder, unspecified: Secondary | ICD-10-CM | POA: Diagnosis present

## 2017-10-04 DIAGNOSIS — I82409 Acute embolism and thrombosis of unspecified deep veins of unspecified lower extremity: Secondary | ICD-10-CM | POA: Diagnosis not present

## 2017-10-04 DIAGNOSIS — Z9049 Acquired absence of other specified parts of digestive tract: Secondary | ICD-10-CM

## 2017-10-04 DIAGNOSIS — Z7901 Long term (current) use of anticoagulants: Secondary | ICD-10-CM | POA: Diagnosis not present

## 2017-10-04 DIAGNOSIS — R451 Restlessness and agitation: Secondary | ICD-10-CM | POA: Diagnosis present

## 2017-10-04 DIAGNOSIS — F17210 Nicotine dependence, cigarettes, uncomplicated: Secondary | ICD-10-CM

## 2017-10-04 DIAGNOSIS — Z833 Family history of diabetes mellitus: Secondary | ICD-10-CM

## 2017-10-04 DIAGNOSIS — E876 Hypokalemia: Secondary | ICD-10-CM | POA: Diagnosis present

## 2017-10-04 DIAGNOSIS — B1920 Unspecified viral hepatitis C without hepatic coma: Secondary | ICD-10-CM | POA: Diagnosis present

## 2017-10-04 DIAGNOSIS — R45850 Homicidal ideations: Secondary | ICD-10-CM | POA: Diagnosis present

## 2017-10-04 DIAGNOSIS — G44009 Cluster headache syndrome, unspecified, not intractable: Secondary | ICD-10-CM | POA: Diagnosis present

## 2017-10-04 DIAGNOSIS — Z993 Dependence on wheelchair: Secondary | ICD-10-CM | POA: Diagnosis not present

## 2017-10-04 DIAGNOSIS — G894 Chronic pain syndrome: Secondary | ICD-10-CM | POA: Diagnosis present

## 2017-10-04 DIAGNOSIS — R450 Nervousness: Secondary | ICD-10-CM

## 2017-10-04 DIAGNOSIS — I10 Essential (primary) hypertension: Secondary | ICD-10-CM | POA: Diagnosis present

## 2017-10-04 LAB — INFLUENZA PANEL BY PCR (TYPE A & B)
INFLAPCR: NEGATIVE
Influenza B By PCR: NEGATIVE

## 2017-10-04 LAB — LIPID PANEL
Cholesterol: 105 mg/dL (ref 0–200)
HDL: 39 mg/dL — AB (ref >40)
LDL CALC: 51 mg/dL (ref 0–99)
TRIGLYCERIDES: 75 mg/dL (ref <150)
Total CHOL/HDL Ratio: 2.7 RATIO
VLDL: 15 mg/dL (ref 0–40)

## 2017-10-04 LAB — POTASSIUM

## 2017-10-04 LAB — TSH: TSH: 2.005 u[IU]/mL (ref 0.350–4.500)

## 2017-10-04 MED ORDER — POTASSIUM CHLORIDE CRYS ER 20 MEQ PO TBCR
20.0000 meq | EXTENDED_RELEASE_TABLET | Freq: Every day | ORAL | Status: DC
  Administered 2017-10-04 – 2017-10-09 (×6): 20 meq via ORAL
  Filled 2017-10-04 (×9): qty 1

## 2017-10-04 MED ORDER — BACLOFEN 20 MG PO TABS
20.0000 mg | ORAL_TABLET | Freq: Four times a day (QID) | ORAL | Status: DC
  Administered 2017-10-04 – 2017-10-09 (×20): 20 mg via ORAL
  Filled 2017-10-04 (×29): qty 1

## 2017-10-04 MED ORDER — OXYCODONE-ACETAMINOPHEN 10-325 MG PO TABS: 1.0000 | ORAL_TABLET | ORAL | Status: DC | PRN

## 2017-10-04 MED ORDER — SERTRALINE HCL 50 MG PO TABS
50.0000 mg | ORAL_TABLET | Freq: Every day | ORAL | Status: DC
  Administered 2017-10-04 – 2017-10-09 (×6): 50 mg via ORAL
  Filled 2017-10-04 (×9): qty 1

## 2017-10-04 MED ORDER — VERAPAMIL HCL 80 MG PO TABS
80.0000 mg | ORAL_TABLET | Freq: Two times a day (BID) | ORAL | Status: DC
  Administered 2017-10-05 – 2017-10-09 (×8): 80 mg via ORAL
  Filled 2017-10-04 (×15): qty 1

## 2017-10-04 MED ORDER — NORTRIPTYLINE HCL 25 MG PO CAPS
25.0000 mg | ORAL_CAPSULE | Freq: Every day | ORAL | Status: DC
  Filled 2017-10-04: qty 1

## 2017-10-04 MED ORDER — DICYCLOMINE HCL 20 MG PO TABS: 20.0000 mg | ORAL_TABLET | Freq: Four times a day (QID) | ORAL | Status: DC | PRN

## 2017-10-04 MED ORDER — ARIPIPRAZOLE 2 MG PO TABS
2.0000 mg | ORAL_TABLET | Freq: Every day | ORAL | Status: DC
  Administered 2017-10-04 – 2017-10-06 (×3): 2 mg via ORAL
  Filled 2017-10-04 (×6): qty 1

## 2017-10-04 MED ORDER — DALFAMPRIDINE ER 10 MG PO TB12: 10.0000 mg | ORAL_TABLET | Freq: Two times a day (BID) | ORAL | Status: DC

## 2017-10-04 MED ORDER — TOPIRAMATE 25 MG PO TABS
25.0000 mg | ORAL_TABLET | ORAL | Status: DC
  Administered 2017-10-04 – 2017-10-08 (×5): 25 mg via ORAL
  Filled 2017-10-04 (×6): qty 1

## 2017-10-04 MED ORDER — TRIAMTERENE-HCTZ 37.5-25 MG PO TABS
1.0000 | ORAL_TABLET | Freq: Every day | ORAL | Status: DC
  Administered 2017-10-06 – 2017-10-09 (×3): 1 via ORAL
  Filled 2017-10-04 (×9): qty 1

## 2017-10-04 MED ORDER — HYDROXYZINE HCL 25 MG PO TABS: 25.0000 mg | ORAL_TABLET | Freq: Three times a day (TID) | ORAL | Status: DC | PRN

## 2017-10-04 MED ORDER — ACETAMINOPHEN 325 MG PO TABS: 650.0000 mg | ORAL_TABLET | Freq: Four times a day (QID) | ORAL | Status: DC | PRN

## 2017-10-04 MED ORDER — GABAPENTIN 600 MG PO TABS
600.0000 mg | ORAL_TABLET | Freq: Four times a day (QID) | ORAL | Status: DC
  Administered 2017-10-04 – 2017-10-09 (×19): 600 mg via ORAL
  Filled 2017-10-04 (×26): qty 1

## 2017-10-04 MED ORDER — TRAZODONE HCL 50 MG PO TABS: 50.0000 mg | ORAL_TABLET | Freq: Every evening | ORAL | Status: DC | PRN

## 2017-10-04 MED ORDER — OXCARBAZEPINE 300 MG PO TABS
300.0000 mg | ORAL_TABLET | Freq: Three times a day (TID) | ORAL | Status: DC
  Administered 2017-10-04 – 2017-10-09 (×15): 300 mg via ORAL
  Filled 2017-10-04 (×22): qty 1

## 2017-10-04 MED ORDER — RIVAROXABAN 10 MG PO TABS
10.0000 mg | ORAL_TABLET | Freq: Every day | ORAL | Status: DC
  Administered 2017-10-04 – 2017-10-09 (×6): 10 mg via ORAL
  Filled 2017-10-04 (×9): qty 1

## 2017-10-04 MED ORDER — NORTRIPTYLINE HCL 25 MG PO CAPS
50.0000 mg | ORAL_CAPSULE | Freq: Every day | ORAL | Status: DC
  Administered 2017-10-04 – 2017-10-08 (×5): 50 mg via ORAL
  Filled 2017-10-04 (×7): qty 2

## 2017-10-04 MED ORDER — TOPIRAMATE 25 MG PO TABS
50.0000 mg | ORAL_TABLET | Freq: Two times a day (BID) | ORAL | Status: DC
  Administered 2017-10-04 – 2017-10-09 (×11): 50 mg via ORAL
  Filled 2017-10-04 (×19): qty 2

## 2017-10-04 MED ORDER — MAGNESIUM HYDROXIDE 400 MG/5ML PO SUSP: 30.0000 mL | Freq: Every day | ORAL | Status: DC | PRN

## 2017-10-04 MED ORDER — HEPARIN SOD (PORK) LOCK FLUSH 100 UNIT/ML IV SOLN
500.0000 [IU] | Freq: Once | INTRAVENOUS | Status: AC
Start: 2017-10-04 — End: 2017-10-04
  Administered 2017-10-04: 500 [IU]
  Filled 2017-10-04: qty 5

## 2017-10-04 MED ORDER — ALUM & MAG HYDROXIDE-SIMETH 200-200-20 MG/5ML PO SUSP: 30.0000 mL | ORAL | Status: DC | PRN

## 2017-10-04 MED ORDER — OXYCODONE HCL 5 MG PO TABS
5.0000 mg | ORAL_TABLET | ORAL | Status: DC | PRN
  Administered 2017-10-04 – 2017-10-09 (×22): 5 mg via ORAL
  Filled 2017-10-04 (×23): qty 1

## 2017-10-04 MED ORDER — OXYCODONE-ACETAMINOPHEN 5-325 MG PO TABS
1.0000 | ORAL_TABLET | ORAL | Status: DC | PRN
  Administered 2017-10-04 – 2017-10-09 (×23): 1 via ORAL
  Filled 2017-10-04 (×23): qty 1

## 2017-10-04 MED ORDER — PANTOPRAZOLE SODIUM 40 MG PO TBEC
40.0000 mg | DELAYED_RELEASE_TABLET | Freq: Every day | ORAL | Status: DC
  Administered 2017-10-04 – 2017-10-09 (×6): 40 mg via ORAL
  Filled 2017-10-04 (×9): qty 1

## 2017-10-04 NOTE — Progress Notes (Signed)
The patient stated he was ambulatory, but used either a walker or a wheelchair for assistance. Stated he could climb stairs, get himself in and out of bed, dress himself and attend to his own ADLs.

## 2017-10-04 NOTE — Progress Notes (Signed)
D. Pt in bed upon initial approach, presenting with sad affect and behavior. After lunch, pt observed in dayroom watching football game, interacting with peers. Pt currently denies SI/HI and AVH and agrees to contact staff before acting on any harmful thoughts.  A. Labs and vitals monitored. Pt compliant with medications. Pt supported emotionally and encouraged to express concerns and ask questions.   R. Pt remains safe with 15 minute checks. Will continue POC.

## 2017-10-04 NOTE — Progress Notes (Signed)
D: Pt was in the hallway upon initial approach.  Pt presents with anxious, depressed affect and mood.  Describes his day as "alright" and reports his goal is "trying to get better."  He reports he is "not really" feeling better.  When asked if he is having thoughts of harming himself, he reports "a little."  Denies HI.  Endorses AVH of "just bad thoughts in my mind" and "like I just see things, mosquitos flying around."  Pt has been visible in milieu interacting with peers and staff appropriately.     A: Introduced self to pt.  Actively listened to pt and offered support and encouragement. Medications administered per order.  PRN medication offered for pain, pt declined.  Q15 minute safety checks maintained.  R: Pt is safe on the unit.  Pt is compliant with medications.  Pt verbally contracts for safety.  Will continue to monitor and assess.

## 2017-10-04 NOTE — Progress Notes (Signed)
Patient ID: [name], male   DOB: 10-14-1961, 56 y.o.   MRN: [id number]  Admission Note  [name] is a 56 y/o male admitted to the 500 I/P adult unit. Pt at the time of admission endorsed moderate anxiety, depression, HI and passive SI; "I am tired of everything going on in my life; my nerves can't just take it anymore; I need to do something to my nephew; I will see how this admission go before I decide my next move."-Pt contracts for safety. Pt with h/o of MS complained of severe generalized body pain-see MAR. Pt very high fall risk due to pain and weakness associated with his MS; Pt uses a w/c. Pt has a port-a-cath insertion for his MS medications. port-a-cath dry and free of infection. Pt skin especially back covered with eczema. Pt also have several tattoos. Support, encouragement, and safe environment provided.  15-minute safety checks initiated and continued. Pt remained calm and cooperative through the admission process. Pt searched and no contrabands found.

## 2017-10-04 NOTE — [facility] Group Notes (Signed)
[facility] LCSW Group Therapy Note  Date/Time:  10/04/2017  11:00AM-12:00PM  Type of Therapy and Topic:  Group Therapy:  Music and Mood  Participation Level:  Did Not Attend   Description of Group: In this process group, members listened to a variety of genres of music and identified that different types of music evoke different responses.  Patients were encouraged to identify music that was soothing for them and music that was energizing for them.  Patients discussed how this knowledge can help with wellness and recovery in various ways including managing depression and anxiety as well as encouraging healthy sleep habits.    Therapeutic Goals: 1. Patients will explore the impact of different varieties of music on mood 2. Patients will verbalize the thoughts they have when listening to different types of music 3. Patients will identify music that is soothing to them as well as music that is energizing to them 4. Patients will discuss how to use this knowledge to assist in maintaining wellness and recovery 5. Patients will explore the use of music as a coping skill  Summary of Patient Progress:  N/A  Therapeutic Modalities: Solution Focused Brief Therapy Motivational Interviewing Activity   Ambrose Mantle, LCSW 10/04/2017 8:30 AM

## 2017-10-04 NOTE — ED Notes (Signed)
TelePsych Assessment completed

## 2017-10-04 NOTE — Tx Team (Signed)
Initial Treatment Plan 10/04/2017 5:57 AM [name] BFX:[id number]    PATIENT STRESSORS: Financial difficulties Health problems   PATIENT STRENGTHS: Capable of independent living Communication skills   PATIENT IDENTIFIED PROBLEMS: "I am tired of everything going on in my life"  Anxiety  Depression  Ineffective individual coping  Risk for suicide  Psychosis           DISCHARGE CRITERIA:  Ability to meet basic life and health needs Adequate post-discharge living arrangements Improved stabilization in mood, thinking, and/or behavior  PRELIMINARY DISCHARGE PLAN: Outpatient therapy Return to previous living arrangement  PATIENT/FAMILY INVOLVEMENT: This treatment plan has been presented to and reviewed with the patient, [name], and/or family member.  The patient and family have been given the opportunity to ask questions and make suggestions.  Eveline Keto, RN 10/04/2017, 5:57 AM
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
## 2017-10-04 NOTE — BHH Suicide Risk Assessment (Signed)
[facility] Admission Suicide Risk Assessment   Nursing information obtained from:   chart review Demographic factors:   male Current Mental Status:   depressed, SI, HI Loss Factors:   diagnosed with MS. Wheelchair bound Historical Factors:   no known history of suicide attempt Risk Reduction Factors:   supportive family  Total Time spent with patient: 30 minutes Principal Problem: Severe major depression, single episode, with psychotic features (HCC) Diagnosis:   Patient Active Problem List   Diagnosis Date Noted  . 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 Weakness [R53.1]   . Cellulitis of right leg [L03.115] 05/07/2017  . Right carpal tunnel syndrome [G56.01] 02/16/2017  . Gait disturbance [R26.9] 01/22/2017  . Insomnia [G47.00] 01/22/2017  . Depression with anxiety [F41.8] 01/22/2017  . High risk medication use [Z79.899] 01/22/2017  . Major depressive disorder, recurrent episode (HCC) [F33.9] 12/19/2016  . Suicidal ideation [R45.851]   . AKI (acute kidney injury) (HCC) [N17.9] 11/15/2016  . Chronic hepatitis C virus infection (HCC) [B18.2] 11/15/2016  . Chronic abdominal pain [R10.9, G89.29] 11/15/2016  . Chronic pain syndrome [G89.4] 11/15/2016  . Acute retention of urine [R33.8] 11/15/2016  . Generalized abdominal pain [R10.84]   . Chronic cluster headache, not intractable [G44.029]   . Community acquired pneumonia [J18.9]   . 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 HCAP (healthcare-associated pneumonia) [J18.9] 02/16/2016  . Hypokalemia [E87.6] 02/16/2016  . Intractable cluster headache syndrome [G44.001]   . DVT (deep venous thrombosis) (HCC) [I82.409]   . Depression [F32.9]   . GERD (gastroesophageal reflux disease) [K21.9]   . HTN (hypertension) [I10]   . Essential hypertension [I10]   . Gastroesophageal reflux disease without esophagitis [K21.9]   . CAP (community acquired pneumonia) [J18.9]   . Multiple sclerosis (HCC) [G35]  08/02/2013  . ABDOMINAL BLOATING [R14.3, R14.1, R14.2] 10/14/2010  . LOOSE STOOLS [R19.7] 10/14/2010  . PULMONARY EMBOLISM, HX OF [Z86.718] 10/14/2010   Subjective Data:  [name] is a 56 year old male with MS, wheelchair bound, on anti-CD20, history of DVT on Xarelton, hypertension, hepatitis C, GERD, who is transferred to [facility] for SI, HI.   Please see H&P for details. He endorses worsening depression, paranoia against his family and CAH of killing his nephew.   Continued Clinical Symptoms:  Alcohol Use Disorder Identification Test Final Score (AUDIT): 1 The "Alcohol Use Disorders Identification Test", Guidelines for Use in Primary Care, Second Edition.  World Science writer [facility]). Score between 0-7:  no or low risk or alcohol related problems. Score between 8-15:  moderate risk of alcohol related problems. Score between 16-19:  high risk of alcohol related problems. Score 20 or above:  warrants further diagnostic evaluation for alcohol dependence and treatment.   CLINICAL FACTORS:   Depression:   Anhedonia Severe   Musculoskeletal: Strength & Muscle Tone: decreased Gait & Station: on wheelchair Patient leans: N/A  Psychiatric Specialty Exam: Physical Exam  Nursing note and vitals reviewed. agree with ED eval  Review of Systems  Constitutional: Positive for malaise/fatigue.  Neurological: Positive for sensory change.  Psychiatric/Behavioral: Positive for depression, hallucinations and suicidal ideas. Negative for memory loss and substance abuse. The patient is nervous/anxious and has insomnia.   All other systems reviewed and are negative.   Blood pressure (!) 109/58, pulse 63, temperature 98.1 F (36.7 C), temperature source Oral, resp. rate 16, height 5' 9.02" (1.753 m), weight 184 lb (83.5 kg), SpO2 94 %.Body mass index is 27.16 kg/m.  General  Appearance: Disheveled  Eye Contact:  Fair  Speech:  Slurred increased latency  Volume:  Normal  Mood:  Depressed   Affect:  Blunt  Thought Process:  Coherent but illogical at times  Orientation:  Full (Time, Place, and Person) except year, date  Thought Content:  Paranoid Ideation and Rumination  Suicidal Thoughts:  Yes.  without intent/plan  Homicidal Thoughts:  Yes.  with intent/plan  Memory:  Immediate;   Fair  Judgement:  Poor  Insight:  Lacking  Psychomotor Activity:  Decreased  Concentration:  Concentration: Fair and Attention Span: Fair  Recall:  [employer]  Fund of Knowledge:  Fair  Language:  Fair  Akathisia:  No  Handed:  Right  AIMS (if indicated):     Assets:  Desire for Improvement  ADL's:  Intact  Cognition:  WNL  Sleep:         COGNITIVE FEATURES THAT CONTRIBUTE TO RISK:  Closed-mindedness    SUICIDE RISK:   Moderate:  Frequent suicidal ideation with limited intensity, and duration, some specificity in terms of plans, no associated intent, good self-control, limited dysphoria/symptomatology, some risk factors present, and identifiable protective factors, including available and accessible social support.  PLAN OF CARE:  Patient will be admitted to inpatient psychiatric unit for stabilization and safety. Will provide and encourage milieu participation. Provide medication management and make adjustments as needed.  Will follow daily.   I certify that inpatient services furnished can reasonably be expected to improve the patient's condition.   Gary Hottereina Fern Asmar, MD 10/04/2017, 1:36 PM
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
## 2017-10-05 DIAGNOSIS — G35 Multiple sclerosis: Secondary | ICD-10-CM

## 2017-10-05 DIAGNOSIS — G4700 Insomnia, unspecified: Secondary | ICD-10-CM

## 2017-10-05 DIAGNOSIS — Z87891 Personal history of nicotine dependence: Secondary | ICD-10-CM

## 2017-10-05 DIAGNOSIS — F39 Unspecified mood [affective] disorder: Secondary | ICD-10-CM

## 2017-10-05 LAB — RAPID URINE DRUG SCREEN, HOSP PERFORMED
AMPHETAMINES: NOT DETECTED
BARBITURATES: NOT DETECTED
BENZODIAZEPINES: NOT DETECTED
COCAINE: NOT DETECTED
Opiates: NOT DETECTED
Tetrahydrocannabinol: NOT DETECTED

## 2017-10-05 LAB — HEMOGLOBIN A1C
HEMOGLOBIN A1C: 5.7 % — AB (ref 4.8–5.6)
MEAN PLASMA GLUCOSE: 117 mg/dL

## 2017-10-05 MED ORDER — HYDROXYZINE HCL 25 MG PO TABS
25.0000 mg | ORAL_TABLET | Freq: Four times a day (QID) | ORAL | Status: DC | PRN
  Administered 2017-10-05: 25 mg via ORAL
  Filled 2017-10-05: qty 1

## 2017-10-05 NOTE — Tx Team (Signed)
Interdisciplinary Treatment and Diagnostic Plan Update  10/05/2017 Time of Session: [id number] Gary DaviesJonas Perez MRN: [id number]  Principal Diagnosis: Severe major depression, single episode, with psychotic features [facility](HCC)  Secondary Diagnoses: Principal Problem:   Severe major depression, single episode, with psychotic features (HCC)   Current Medications:  Current Facility-Administered Medications  Medication Dose Route Frequency Provider Last Rate Last Dose  . acetaminophen (TYLENOL) tablet 650 mg  650 mg Oral Q6H PRN [name], Gary A, NP      . alum & mag hydroxide-simeth (MAALOX/MYLANTA) 200-200-20 MG/5ML suspension 30 mL  30 mL Oral Q4H PRN [name], Gary A, NP      . ARIPiprazole (ABILIFY) tablet 2 mg  2 mg Oral Daily Perez, [name], MD   2 mg at 10/04/17 0912  . baclofen (LIORESAL) tablet 20 mg  20 mg Oral QID [name], Gary A, NP   20 mg at 10/04/17 2105  . dalfampridine TB12 10 mg  10 mg Oral BID [name], Gary A, NP      . dicyclomine (BENTYL) tablet 20 mg  20 mg Oral Q6H PRN [name], Gary A, NP      . gabapentin (NEURONTIN) tablet 600 mg  600 mg Oral QID [name], Gary A, NP   600 mg at 10/04/17 2106  . magnesium hydroxide (MILK OF MAGNESIA) suspension 30 mL  30 mL Oral Daily PRN [name], Gary A, NP      . nortriptyline (PAMELOR) capsule 50 mg  50 mg Oral QHS [name], [name], MD   50 mg at 10/04/17 2106  . Oxcarbazepine (TRILEPTAL) tablet 300 mg  300 mg Oral TID [name], Gary A, NP   300 mg at 10/04/17 0908  . oxyCODONE-acetaminophen (PERCOCET/ROXICET) 5-325 MG per tablet 1 tablet  1 tablet Oral Q4H PRN [name], Gary T, MD   1 tablet at 10/05/17 0630   And  . oxyCODONE (Oxy IR/ROXICODONE) immediate release tablet 5 mg  5 mg Oral Q4H PRN [name], Gary T, MD   5 mg at 10/05/17 0630  . pantoprazole (PROTONIX) EC tablet 40 mg  40 mg Oral Daily [name], Gary A, NP   40 mg at 10/04/17 [id number]  . potassium chloride SA (K-DUR,KLOR-CON) CR tablet 20 mEq  20 mEq Oral Daily [name], Gary A, NP    20 mEq at 10/04/17 0907  . rivaroxaban (XARELTO) tablet 10 mg  10 mg Oral Daily [name], Gary A, NP   10 mg at 10/04/17 0909  . sertraline (ZOLOFT) tablet 50 mg  50 mg Oral Daily [name], Gary A, NP   50 mg at 10/04/17 0900  . topiramate (TOPAMAX) tablet 25 mg  25 mg Oral Q24H [name], Gary T, MD   25 mg at 10/04/17 1543  . topiramate (TOPAMAX) tablet 50 mg  50 mg Oral BID [name], Gary A, NP   50 mg at 10/04/17 1950  . triamterene-hydrochlorothiazide (MAXZIDE-25) 37.5-25 MG per tablet 1 tablet  1 tablet Oral Daily [name], Gary A, NP      . verapamil (CALAN) tablet 80 mg  80 mg Oral BID Jackelyn PolingBerry, Gary A, NP       Facility-Administered Medications Ordered in Other Encounters  Medication Dose Route Frequency Provider Last Rate Last Dose  . sodium chloride 0.9 % injection 10 mL  10 mL Intracatheter PRN [name], Gary F, NP   10 mL at 11/07/15 1012   PTA Medications: Medications Prior to Admission  Medication Sig Dispense Refill Last Dose  . baclofen (LIORESAL) 20 MG tablet Take 20 mg by mouth 4 (  four) times daily.    10/03/2017 at Unknown time  . dalfampridine 10 MG TB12 Take 1 tablet (10 mg total) by mouth 2 (two) times daily. 60 tablet 11 10/03/2017 at Unknown time  . dicyclomine (BENTYL) 20 MG tablet Take 20 mg by mouth every 6 (six) hours as needed for spasms.   6 Past Week at Unknown time  . Eszopiclone (ESZOPICLONE) 3 MG TABS Take 3 mg by mouth at bedtime as needed (for sleep).    Past Week at Unknown time  . gabapentin (NEURONTIN) 600 MG tablet Take 600 mg by mouth 4 (four) times daily.    10/03/2017 at Unknown time  . KLOR-CON M20 20 MEQ tablet Take 20 mEq by mouth daily.  2 10/03/2017 at Unknown time  . nortriptyline (PAMELOR) 25 MG capsule Take 1 capsule (25 mg total) by mouth at bedtime. 30 capsule 0 10/02/2017 at Unknown time  . omeprazole (PRILOSEC) 40 MG capsule Take 40 mg by mouth every evening.   4 10/03/2017 at Unknown time  . Oxcarbazepine (TRILEPTAL) 300 MG tablet Take 300  mg by mouth 3 (three) times daily.   10/03/2017 at Unknown time  . oxyCODONE-acetaminophen (PERCOCET) 10-325 MG tablet Take 1 tablet by mouth every 4 (four) hours as needed for pain. 5 tablet 0 10/03/2017 at Unknown time  . sertraline (ZOLOFT) 50 MG tablet Take 50 mg by mouth daily.    10/03/2017 at Unknown time  . topiramate (TOPAMAX) 25 MG tablet Take 1 tablet (25 mg total) by mouth 2 (two) times daily. (Patient taking differently: Take 25-50 mg by mouth See admin instructions. Take 2 tablets every morning and at night then take 1 tablet at 3 pm) 30 tablet 0 10/03/2017 at Unknown time  . triamterene-hydrochlorothiazide (DYAZIDE) 37.5-25 MG capsule Take 1 capsule by mouth daily.  3 10/03/2017 at Unknown time  . verapamil (CALAN) 80 MG tablet Take 80 mg by mouth 2 (two) times daily.   10/03/2017 at Unknown time  . XARELTO 10 MG TABS tablet Take 10 mg by mouth daily.  2 10/03/2017 at 1600    Patient Stressors: Financial difficulties Health problems  Patient Strengths: Capable of independent living Communication skills  Treatment Modalities: Medication Management, Group therapy, Case management,  1 to 1 session with clinician, Psychoeducation, Recreational therapy.   Physician Treatment Plan for Primary Diagnosis: Severe major depression, single episode, with psychotic features (HCC) Long Term Goal(s): Improvement in symptoms so as ready for discharge Improvement in symptoms so as ready for discharge   Short Term Goals: Ability to demonstrate self-control will improve Ability to identify and develop effective coping behaviors will improve Ability to demonstrate self-control will improve Ability to identify and develop effective coping behaviors will improve  Medication Management: Evaluate patient's response, side effects, and tolerance of medication regimen.  Therapeutic Interventions: 1 to 1 sessions, Unit Group sessions and Medication administration.  Evaluation of Outcomes:  Progressing  Physician Treatment Plan for Secondary Diagnosis: Principal Problem:   Severe major depression, single episode, with psychotic features (HCC)  Long Term Goal(s): Improvement in symptoms so as ready for discharge Improvement in symptoms so as ready for discharge   Short Term Goals: Ability to demonstrate self-control will improve Ability to identify and develop effective coping behaviors will improve Ability to demonstrate self-control will improve Ability to identify and develop effective coping behaviors will improve     Medication Management: Evaluate patient's response, side effects, and tolerance of medication regimen.  Therapeutic Interventions: 1 to 1 sessions, Unit  Group sessions and Medication administration.  Evaluation of Outcomes: Progressing   RN Treatment Plan for Primary Diagnosis: Severe major depression, single episode, with psychotic features (HCC) Long Term Goal(s): Knowledge of disease and therapeutic regimen to maintain health will improve  Short Term Goals: Ability to remain free from injury will improve, Ability to verbalize feelings will improve and Ability to disclose and discuss suicidal ideas  Medication Management: RN will administer medications as ordered by provider, will assess and evaluate patient's response and provide education to patient for prescribed medication. RN will report any adverse and/or side effects to prescribing provider.  Therapeutic Interventions: 1 on 1 counseling sessions, Psychoeducation, Medication administration, Evaluate responses to treatment, Monitor vital signs and CBGs as ordered, Perform/monitor CIWA, COWS, AIMS and Fall Risk screenings as ordered, Perform wound care treatments as ordered.  Evaluation of Outcomes: Progressing   LCSW Treatment Plan for Primary Diagnosis: Severe major depression, single episode, with psychotic features (HCC) Long Term Goal(s): Safe transition to appropriate next level of care at  discharge, Engage patient in therapeutic group addressing interpersonal concerns.  Short Term Goals: Engage patient in aftercare planning with referrals and resources, Facilitate patient progression through stages of change regarding substance use diagnoses and concerns and Identify triggers associated with mental health/substance abuse issues  Therapeutic Interventions: Assess for all discharge needs, 1 to 1 time with Social worker, Explore available resources and support systems, Assess for adequacy in community support network, Educate family and significant other(s) on suicide prevention, Complete Psychosocial Assessment, Interpersonal group therapy.  Evaluation of Outcomes: Progressing   Progress in Treatment: Attending groups: No. New to unit. Continuing to assess.  Participating in groups: No. Taking medication as prescribed: Yes. Toleration medication: Yes. Family/Significant other contact made: No, will contact:  family member if patient consents Patient understands diagnosis: Yes. Discussing patient identified problems/goals with staff: Yes. Medical problems stabilized or resolved: Yes. Denies suicidal/homicidal ideation: Yes. Issues/concerns per patient self-inventory: No. Other: n/Perez   New problem(s) identified: No, Describe:  n/Perez  New Short Term/Long Term Goal(s): medication management for mood stabilization, elimination of SI/HI thoughts, development of comprehensive mental wellness plan.   Discharge Plan or Barriers: CSW assessing. Pt lives with mother, nephew, and brother. Unsure if he will be returning home. Guns in home; reporting HI toward nephew upon admission. No current mental health providers.   Reason for Continuation of Hospitalization: Anxiety Depression Medical Issues Medication stabilization Withdrawal symptoms  Estimated Length of Stay: Thursday, 10/08/17  Attendees: Patient: 10/05/2017 8:17 AM  Physician: Dr. Jackquline Berlin MD; Dr. Altamese Blackwater MD 10/05/2017  8:17 AM  Nursing: Frederich Cha RN 10/05/2017 8:17 AM  RN Care Manager: Onnie Boer CM 10/05/2017 8:17 AM  Social Worker: Trula Slade, LCSW 10/05/2017 8:17 AM  Recreational Therapist: x 10/05/2017 8:17 AM  Other: Armandina Stammer NP 10/05/2017 8:17 AM  Other:  10/05/2017 8:17 AM  Other: 10/05/2017 8:17 AM    Scribe for Treatment Team: Ledell Peoples Smart, LCSW 10/05/2017 8:17 AM

## 2017-10-05 NOTE — Progress Notes (Signed)
Recreation Therapy Notes  Date: 10/05/17 Time:  0930 Location: [address] Dayroom  Group Topic: Stress Management  Goal Area(s) Addresses:  Patient will verbalize importance of using healthy stress management.  Patient will identify positive emotions associated with healthy stress management.   Intervention: Stress Management  Activity : Guided Imagery.  LRT introduced the stress management technique of guided imagery.  LRT read a script that allowed patients to envision listening to the waves at the beach.  Patients were to follow along as LRT read script.  Education:  Stress Management, Discharge Planning.   Education Outcome: Acknowledges edcuation/In group clarification offered/Needs additional education  Clinical Observations/Feedback: Pt did not attend group.   Caroll Rancher, LRT/CTRS         Caroll Rancher A 10/05/2017 12:20 PM

## 2017-10-05 NOTE — [facility] Suicide Risk Assessment (Signed)
[facility] INPATIENT:  Family/Significant Other Suicide Prevention Education  Suicide Prevention Education:  Education Completed; Thelonius Holstad (pt's mother) [phone] has been identified by the patient as the family member/significant other with whom the patient will be residing, and identified as the person(s) who will aid the patient in the event of a mental health crisis (suicidal ideations/suicide attempt).  With written consent from the patient, the family member/significant other has been provided the following suicide prevention education, prior to the and/or following the discharge of the patient.  The suicide prevention education provided includes the following:  Suicide risk factors  Suicide prevention and interventions  National Suicide Hotline telephone number  [facility] assessment telephone number  [facility] Emergency Assistance 911  [facility] and/or Residential Mobile Crisis Unit telephone number  Request made of family/significant other to:  Remove weapons (e.g., guns, rifles, knives), all items previously/currently identified as safety concern.    Remove drugs/medications (over-the-counter, prescriptions, illicit drugs), all items previously/currently identified as a safety concern.  The family member/significant other verbalizes understanding of the suicide prevention education information provided.  The family member/significant other agrees to remove the items of safety concern listed above.  SPE and aftercare reviewed with pt's mother. Pt states that he owns a handgun and has it locked up in his room "for protection." Pt's mother was unaware that pt had gun, stating, "He must have it in his lock box in his room." She has no concerns regarding pt returning home and states "he's never hurt himself or anyone else to my knowledge." Pt shared that due to ongoing conflict with his adult nephew who also lives in the home, he plans to make a plan to  move out and is currently reviewing his options. Pt's mother confirmed this. Pt denies HI or SI at this time and states that he can tolerate his nephew until pt moves out in the near future. Pt's mother shared that pt "gets agitated sometimes and yells at [place] but has not done anything to hurt anyone or damage property."   [employer] LCSW 10/05/2017, 2:35 PM

## 2017-10-05 NOTE — Progress Notes (Signed)
[facility] Progress Note  10/05/2017 3:36 PM [name] Bullis  MRN:  [id number]  Subjective: [name] reports, "I'm still depressed. My MS is acting up today. I feeling like killing my own people that tried to set me up for jail.  I have been dreaming about hurting them if I can get my hands on them".  Objective: [name] Arkin is a 56 year old male with MS, wheelchair bound, on anti-CD20, history of DVT on Xarelton, hypertension, hepatitis C, GERD, who [facility].  10-05-17, [name] is seen today & chart reviewed. His case is dicussed with the treatment team. [name] reports that he is still depressed & feeling homicidal towards his nephew. However, denies any plans or intent to kill him, himself or any one else. He is visible on the unit. He is on a wheel chair which he uses for mobility within the unit. He is attended group sessions. Although saying he is feeling HI, his affect presents a different story. His affect is appropriate. He is cooperative with staff & his treatment regimen. He denies any adverse effects. He denies any SI, AVH, does not present as responding to any internal stimuli. Staff continues to provide support.  Principal Problem: Severe major depression, single episode, with psychotic features (HCC)  Diagnosis:   Patient Active Problem List   Diagnosis Date Noted  . 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 Weakness [R53.1]   . Cellulitis of right leg [L03.115] 05/07/2017  . Right carpal tunnel syndrome [G56.01] 02/16/2017  . Gait disturbance [R26.9] 01/22/2017  . Insomnia [G47.00] 01/22/2017  . Depression with anxiety [F41.8] 01/22/2017  . High risk medication use [Z79.899] 01/22/2017  . Major depressive disorder, recurrent episode (HCC) [F33.9] 12/19/2016  . Suicidal ideation [R45.851]   . AKI (acute kidney injury) (HCC) [N17.9] 11/15/2016  . Chronic hepatitis C virus infection (HCC) [B18.2] 11/15/2016  .  Chronic abdominal pain [R10.9, G89.29] 11/15/2016  . Chronic pain syndrome [G89.4] 11/15/2016  . Acute retention of urine [R33.8] 11/15/2016  . Generalized abdominal pain [R10.84]   . Chronic cluster headache, not intractable [G44.029]   . Community acquired pneumonia [J18.9]   . 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 HCAP (healthcare-associated pneumonia) [J18.9] 02/16/2016  . Hypokalemia [E87.6] 02/16/2016  . Intractable cluster headache syndrome [G44.001]   . DVT (deep venous thrombosis) (HCC) [I82.409]   . Depression [F32.9]   . GERD (gastroesophageal reflux disease) [K21.9]   . HTN (hypertension) [I10]   . Essential hypertension [I10]   . Gastroesophageal reflux disease without esophagitis [K21.9]   . CAP (community acquired pneumonia) [J18.9]   . Multiple sclerosis (HCC) [G35] 08/02/2013  . ABDOMINAL BLOATING [R14.3, R14.1, R14.2] 10/14/2010  . LOOSE STOOLS [R19.7] 10/14/2010  . PULMONARY EMBOLISM, HX OF [Z86.718] 10/14/2010   Total Time spent with patient: 25 minutes  Past Psychiatric History: See H&P.  Past Medical History:  Past Medical History:  Diagnosis Date  . Abdominal pain, unspecified site   . Anxiety   . Arthritis   . Benign paroxysmal positional vertigo   . Cellulitis and abscess of right leg 04/2017  . Chronic back pain   . Chronic pain syndrome 01/25/2008  . Cluster headache   . Depression    takes Zoloft daily  . DVT (deep venous thrombosis) (HCC)    in the left arm '09  . Gait abnormality    "uses mobile wheelchair, but is ambulatory"  . Gallstones 02/17/2009   resolved  after gallbladder surgery.  [name] GERD (gastroesophageal reflux disease)    takes Omeprazole as needed  . Headache(784.0)    cluster headaches frequently-takes Topamax daily  . History of colonoscopy   . HTN (hypertension)    takes Lisinopril,Verapamil,and Triamterene HCTZ daily  . Insomnia 11/06/2008  . Joint pain   . Joint swelling    03-07-16 "swelling of right wrist" "after a fall-xray  done 03-06-16 "no fractures".  . Memory loss    no an issue at present 03-07-16  . Multiple sclerosis (HCC)    Dx. 2005 - Dr. Tinnie Gens follows LOV 4'17 tx. Tysabri monthly IV-[facility] [place].  [name] Nonspecific elevation of levels of transaminase or lactic acid dehydrogenase (LDH)   . Other specified visual disturbances   . Other syndromes affecting cervical region   . Pneumonia 2009  . Trigeminal neuralgia     history" Multiple sclerosis"    Past Surgical History:  Procedure Laterality Date  . CHOLECYSTECTOMY  02/20/2009  . COLONOSCOPY WITH PROPOFOL N/A 03/17/2016   Procedure: COLONOSCOPY WITH PROPOFOL;  Surgeon: [name], MD;  Location: [facility];  Service: Endoscopy;  Laterality: N/A;  . PORT A CATH REVISION N/A 07/06/2015   Procedure: Removal and replacement of PORT A CATH;  Surgeon: [name], MD;  Location: [facility] OR;  Service: General;  Laterality: N/A;  . PORTACATH PLACEMENT N/A 03/27/2014   Procedure: INSERTION PORT-A-CATH;  Surgeon: [name], MD;  Location: [facility] OR;  Service: General;  Laterality: N/A;   Family History:  Family History  Problem Relation Age of Onset  . Cancer Father   . Diabetes Mother    Family Psychiatric  History: See H&P  Social History:  Social History   Substance and Sexual Activity  Alcohol Use Yes  . Alcohol/week: 0.0 oz   Comment: occasional     Social History   Substance and Sexual Activity  Drug Use No   Comment: Quit 2011    Social History   Socioeconomic History  . Marital status: Divorced    Spouse name: None  . Number of children: 3  . Years of education: 100 th  . Highest education level: None  Social Needs  . Financial resource strain: None  . Food insecurity - worry: None  . Food insecurity - inability: None  . Transportation needs - medical: None  . Transportation needs - non-medical: None  Occupational History    Comment: Disabled  Tobacco Use  . Smoking status: Former Smoker     Packs/day: 1.00    Years: 38.00    Pack years: 38.00    Types: Cigarettes  . Smokeless tobacco: Never Used  . Tobacco comment: cutting back  Substance and Sexual Activity  . Alcohol use: Yes    Alcohol/week: 0.0 oz    Comment: occasional  . Drug use: No    Comment: Quit 2011  . Sexual activity: None  Other Topics Concern  . None  Social History Narrative   Patient is divorced. Patient is disabled. Because of his MS. Patient has 11 th grade education. 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    Right handed.   Caffeine- One daily.   Additional Social History:  Pain Medications: See MAR Prescriptions: See MAR Over the Counter: See MAR History of alcohol / drug use?: Yes(drink occasionally) Longest period of sobriety (when/how long): Three years  Sleep: Good  Appetite:  Good  Current Medications: Current Facility-Administered Medications  Medication  Dose Route Frequency Provider Last Rate Last Dose  . acetaminophen (TYLENOL) tablet 650 mg  650 mg Oral Q6H PRN Nira Conn A, NP      . alum & mag hydroxide-simeth (MAALOX/MYLANTA) 200-200-20 MG/5ML suspension 30 mL  30 mL Oral Q4H PRN Nira Conn A, NP      . ARIPiprazole (ABILIFY) tablet 2 mg  2 mg Oral Daily [name], [name], MD   2 mg at 10/05/17 0830  . baclofen (LIORESAL) tablet 20 mg  20 mg Oral QID Nira Conn A, NP   20 mg at 10/05/17 1209  . dalfampridine TB12 10 mg  10 mg Oral BID Nira Conn A, NP      . dicyclomine (BENTYL) tablet 20 mg  20 mg Oral Q6H PRN Nira Conn A, NP      . gabapentin (NEURONTIN) tablet 600 mg  600 mg Oral QID Nira Conn A, NP   600 mg at 10/05/17 1210  . magnesium hydroxide (MILK OF MAGNESIA) suspension 30 mL  30 mL Oral Daily PRN Nira Conn A, NP      . nortriptyline (PAMELOR) capsule 50 mg  50 mg Oral QHS Neysa Hotter, MD   50 mg at 10/04/17 2106  . Oxcarbazepine (TRILEPTAL) tablet 300 mg  300 mg Oral TID Nira Conn A, NP   300 mg at 10/05/17 1210  .  oxyCODONE-acetaminophen (PERCOCET/ROXICET) 5-325 MG per tablet 1 tablet  1 tablet Oral Q4H PRN Micheal Likens, MD   1 tablet at 10/05/17 1029   And  . oxyCODONE (Oxy IR/ROXICODONE) immediate release tablet 5 mg  5 mg Oral Q4H PRN Micheal Likens, MD   5 mg at 10/05/17 1029  . pantoprazole (PROTONIX) EC tablet 40 mg  40 mg Oral Daily Nira Conn A, NP   40 mg at 10/05/17 0829  . potassium chloride SA (K-DUR,KLOR-CON) CR tablet 20 mEq  20 mEq Oral Daily Nira Conn A, NP   20 mEq at 10/05/17 0830  . rivaroxaban (XARELTO) tablet 10 mg  10 mg Oral Daily Nira Conn A, NP   10 mg at 10/05/17 0830  . sertraline (ZOLOFT) tablet 50 mg  50 mg Oral Daily Nira Conn A, NP   50 mg at 10/05/17 0830  . topiramate (TOPAMAX) tablet 25 mg  25 mg Oral Q24H Micheal Likens, MD   25 mg at 10/05/17 1526  . topiramate (TOPAMAX) tablet 50 mg  50 mg Oral BID Nira Conn A, NP   50 mg at 10/05/17 0830  . triamterene-hydrochlorothiazide (MAXZIDE-25) 37.5-25 MG per tablet 1 tablet  1 tablet Oral Daily Nira Conn A, NP      . verapamil (CALAN) tablet 80 mg  80 mg Oral BID Jackelyn Poling, NP       Facility-Administered Medications Ordered in Other Encounters  Medication Dose Route Frequency Provider Last Rate Last Dose  . sodium chloride 0.9 % injection 10 mL  10 mL Intracatheter PRN Loann Quill, NP   10 mL at 11/07/15 1012   Lab Results:  Results for orders placed or performed during the hospital encounter of 10/04/17 (from the past 48 hour(s))  Hemoglobin A1c     Status: Abnormal   Collection Time: 10/04/17  6:30 AM  Result Value Ref Range   Hgb A1c MFr Bld 5.7 (H) 4.8 - 5.6 %    Comment: (NOTE)         Prediabetes: 5.7 - 6.4         Diabetes: >  6.4         Glycemic control for adults with diabetes: <7.0    Mean Plasma Glucose 117 mg/dL    Comment: (NOTE) Performed At: [facility] [address] [place], [place] [id number] Jolene Schimke MD  [phone] Performed at [facility], [address], [place], [place] [address]   Lipid panel     Status: Abnormal   Collection Time: 10/04/17  6:30 AM  Result Value Ref Range   Cholesterol 105 0 - 200 mg/dL   Triglycerides 75 <034 mg/dL   HDL 39 (L) >74 mg/dL   Total CHOL/HDL Ratio 2.7 RATIO   VLDL 15 0 - 40 mg/dL   LDL Cholesterol 51 0 - 99 mg/dL    Comment:        Total Cholesterol/HDL:CHD Risk Coronary Heart Disease Risk Table                     Men   Women  1/2 Average Risk   3.4   3.3  Average Risk       5.0   4.4  2 X Average Risk   9.6   7.1  3 X Average Risk  23.4   11.0        Use the calculated Patient Ratio above and the CHD Risk Table to determine the patient's CHD Risk.        ATP III CLASSIFICATION (LDL):  <100     mg/dL   Optimal  259-563  mg/dL   Near or Above                    Optimal  130-159  mg/dL   Borderline  875-643  mg/dL   High  >329     mg/dL   Very High Performed at [facility], [address], [place], [place] [address]   TSH     Status: None   Collection Time: 10/04/17  6:30 AM  Result Value Ref Range   TSH 2.005 0.350 - 4.500 uIU/mL    Comment: Performed by a 3rd Generation assay with a functional sensitivity of <=0.01 uIU/mL. Performed at [facility], [address], [place], [place] [address]   Potassium     Status: None   Collection Time: 10/04/17  6:30 AM  Result Value Ref Range   Potassium  3.5 - 5.1 mmol/L    QUESTIONABLE RESULTS, RECOMMEND RECOLLECT TO VERIFY    CommentPola Corn [id number] @1527  BY V.WILKINS Performed at [facility], [address], [place], [place] [address] CORRECTED ON 11/25 AT 1521: PREVIOUSLY REPORTED AS 5.1   Rapid urine drug screen (hospital performed)     Status: None   Collection Time: 10/04/17  9:20 AM  Result Value Ref Range   Opiates NONE DETECTED NONE DETECTED   Cocaine NONE DETECTED NONE DETECTED   Benzodiazepines NONE  DETECTED NONE DETECTED   Amphetamines NONE DETECTED NONE DETECTED   Tetrahydrocannabinol NONE DETECTED NONE DETECTED   Barbiturates NONE DETECTED NONE DETECTED    Comment:        DRUG SCREEN FOR MEDICAL PURPOSES ONLY.  IF CONFIRMATION IS NEEDED FOR ANY PURPOSE, NOTIFY LAB WITHIN 5 DAYS.        LOWEST DETECTABLE LIMITS FOR URINE DRUG SCREEN Drug Class       Cutoff (ng/mL) Amphetamine      1000 Barbiturate      200 Benzodiazepine   200 Tricyclics  300 Opiates          300 Cocaine          300 THC              50 Performed at [facility], [address] [place], [place] [address]    Blood Alcohol level:  Lab Results  Component Value Date   [facility] <5 12/17/2016   ETH <5 08/23/2015   Metabolic Disorder Labs: Lab Results  Component Value Date   HGBA1C 5.7 (H) 10/04/2017   MPG 117 10/04/2017   MPG 123 11/16/2016   No results found for: PROLACTIN Lab Results  Component Value Date   CHOL 105 10/04/2017   TRIG 75 10/04/2017   HDL 39 (L) 10/04/2017   CHOLHDL 2.7 10/04/2017   VLDL 15 10/04/2017   LDLCALC 51 10/04/2017   Physical Findings: AIMS: Facial and Oral Movements Muscles of Facial Expression: None, normal Lips and Perioral Area: None, normal Jaw: None, normal Tongue: None, normal,Extremity Movements Upper (arms, wrists, hands, fingers): None, normal Lower (legs, knees, ankles, toes): None, normal, Trunk Movements Neck, shoulders, hips: None, normal, Overall Severity Severity of abnormal movements (highest score from questions above): None, normal Incapacitation due to abnormal movements: None, normal Patient's awareness of abnormal movements (rate only patient's report): No Awareness, Dental Status Current problems with teeth and/or dentures?: No Does patient usually wear dentures?: No  CIWA:    COWS:     Musculoskeletal: Strength & Muscle Tone: within normal limits Gait & Station: Unsteady, uses the wheel chair to aid his  ambulations. Patient leans: N/A  Psychiatric Specialty Exam: Physical Exam  ROS  Blood pressure (!) 119/50, pulse (!) 56, temperature 98.4 F (36.9 C), temperature source Oral, resp. rate 16, height 5' 9.02" (1.753 m), weight 83.5 kg (184 lb), SpO2 94 %.Body mass index is 27.16 kg/m.  General Appearance: Fairly Groomed  Eye Contact:  Fair  Speech:  Slurred increased latency  Volume:  Normal  Mood:  Depressed  Affect:  Blunt  Thought Process:  Coherent illogical at times  Orientation:  Full (Time, Place, and Person) except year "2017" and date   Thought Content:  Paranoid Ideation  Suicidal Thoughts:  Yes.  without intent/plan  Homicidal Thoughts:  Yes.  with intent/plan shooting his nephew  Memory:  Immediate;   Fair  Judgement:  Impaired  Insight:  Lacking  Psychomotor Activity:  Decreased  Concentration:  Concentration: Fair and Attention Span: Fair  Recall:  [employer] of Knowledge:  Fair  Language:  Fair  Akathisia:  No  Handed:  Right  AIMS (if indicated):     Assets:  Desire for Improvement  ADL's:  Intact  Cognition:  WNL  Sleep: 6.75      Treatment Plan Summary: Daily contact with patient to assess and evaluate symptoms and progress in treatment.  Will continue today 10/05/2017 plan as below except where it is noted.  Major depression.  -Continue Sertraline 50 mg pr daily.  -Continue Abilify 2 mg for mood control/adjunct treatment for depression.  -Continue Topamax 25 mg & 50 mg respectively for mood Stabilization.  -Continue Trileptal 300 mg tid po for mood stabilization.  -Continue Pamelor 50 mg daily for depression/insomnia.  Medical issues/pain. -Continue all other pertinent medication regimen as recommended. -Encourage group session attendance & recommendation. -Sw to continue to work on the discharge disposition. -Patient is currently using wheel chair to aid mobility.  Sanjuana Kava, NP, PMHNP, FNP-BC. 10/05/2017, 3:36 PM

## 2017-10-05 NOTE — Plan of Care (Signed)
  Progressing Activity: Sleeping patterns will improve 10/05/2017 2329 - Progressing by Arrie Aran, RN Note Slept 6.75 hours last night according to flowsheet.

## 2017-10-05 NOTE — Plan of Care (Signed)
Patient endorsing passive SI but denies intent, plan. Has not engaged in self harm. Verbal contract in place.  Patient remains isolative to room, bed.

## 2017-10-05 NOTE — Plan of Care (Signed)
  Progressing Safety: Periods of time without injury will increase 10/04/2017 2359 - Progressing by Arrie Aran, RN Note Pt has not harmed self or others tonight.  He endorses passive thoughts of self-harm without intent or plan.  Pt verbally contracts for safety.

## 2017-10-05 NOTE — Progress Notes (Signed)
D: Pt was in bed in his room upon initial approach.  Pt presents with depressed, anxious affect and mood.  His goal is to "prioritize, get my goals set up, I'm still working on it."  Pt denies SI/HI, denies hallucinations, complains of generalized pain of 9/10.  He has been using his wheelchair for mobility.  Pt has been visible in milieu interacting with peers appropriately.  He is demanding towards staff at times.   A: Introduced self to pt.  Actively listened to pt and offered support and encouragement. Medications administered per order.  On-site provider notified that pt requests PRN anxiety medication and medication for anxiety was ordered and administered.  PRN medication administered for pain.  Q15 minute safety checks maintained.  R: Pt is safe on the unit.  Pt is compliant with medications.  Pt verbally contracts for safety.  Will continue to monitor and assess.

## 2017-10-05 NOTE — Progress Notes (Addendum)
D: Patient observed resting in bed. Isolative to room. Patient continues to report chronic pain off and on and requests narcotics every 4 hours. Does have periods of relief after admin as patient observed sleeping. Patient's affect flat, mood depressed. Patient loud, demanding and irritable at times though is redirectable. Per self inventory and discussions with writer, rates depression, hopelessness and anxiety all at a 2/10. Rates sleep as poor, appetite as fair, energy as normal and concentration as poor.  States goal for today is "getting all of this bad stuff out of my life. To reach them as long as I can go. Reach the right people. When God does does His work, look out people. No man can stop His work or No devil can stop it either!!!" Chronic MS/generalized pain rated at a 9/10. No other physical complaints.   A: Medicated per orders, prn percocet and roxicodone given for pain. Antihypertensives held as patient's BP, pulse on low end and patient complaining of dizziness. Level III obs in place for safety. Emotional support offered and self inventory reviewed. Encouraged completion of Suicide Safety Plan and programming participation. Discussed POC with MD, SW.  Fall prevention plan in place and reviewed with patient as pt is a high fall risk due to MS, chronic pain and unsteady gait.   R: Patient verbalizes understanding of POC, falls prevention education. On reassess of pain meds, patient has been asleep. Patient endorsing passive SI/HI but states, "it's not worth it. I'm not going to act on it." Verbal contract in place for safety. Endorsing AVH but denies they are command in nature. Patient remains safe on level III obs. Will continue to monitor closely and make verbal contact frequently.

## 2017-10-06 LAB — COMPREHENSIVE METABOLIC PANEL
ALK PHOS: 89 U/L (ref 38–126)
ALT: 13 U/L — AB (ref 17–63)
AST: 12 U/L — ABNORMAL LOW (ref 15–41)
Albumin: 3.5 g/dL (ref 3.5–5.0)
Anion gap: 6 (ref 5–15)
BILIRUBIN TOTAL: 0.2 mg/dL — AB (ref 0.3–1.2)
BUN: 11 mg/dL (ref 6–20)
CALCIUM: 8.9 mg/dL (ref 8.9–10.3)
CO2: 22 mmol/L (ref 22–32)
CREATININE: 0.7 mg/dL (ref 0.61–1.24)
Chloride: 114 mmol/L — ABNORMAL HIGH (ref 101–111)
Glucose, Bld: 95 mg/dL (ref 65–99)
Potassium: 3.6 mmol/L (ref 3.5–5.1)
Sodium: 142 mmol/L (ref 135–145)
Total Protein: 6.2 g/dL — ABNORMAL LOW (ref 6.5–8.1)

## 2017-10-06 MED ORDER — ARIPIPRAZOLE 10 MG PO TABS
10.0000 mg | ORAL_TABLET | Freq: Every day | ORAL | Status: DC
  Administered 2017-10-07: 10 mg via ORAL
  Filled 2017-10-06 (×3): qty 1

## 2017-10-06 NOTE — Progress Notes (Signed)
[facility] MD Progress Note  10/06/2017 12:34 PM [name]  MRN:  [id number] Subjective:   [name] is a 56 y/o M with history of MDD with psychotic features and MS who was admitted with worsening depression, SI, and HI. Pt had initially presented with worsening of MS symptoms, but then he also expressed HI towards his nephew due to feeling that his nephew "set me up." He initially reported CAH to kill his nephew and VH of seeing things. He was restarted on psychotropic medications and admitted to [facility]. Today upon evaluation, he continues to have some disorganized aspects to his speech. He continues to endorse HI towards his nephew but he denies any intent of following through with his thoughts. He denies SI. He denies AH. He reports VH of seeing "shapes" and he also reports seeing "a big head" next to this provider during the interview. Pt reports that he is sleeping well. His appetite is good. We discussed increasing dose of abilify to address his ongoing symptoms of psychosis and pt was in agreement. He had no further questions, comments, or concerns.  Principal Problem: Severe major depression, single episode, with psychotic features ([place]) Diagnosis:   Patient Active Problem List   Diagnosis Date Noted  . Severe major depression, single episode, with psychotic features ([place]) [F32.3] 10/04/2017  . Weakness [R53.1]   . Cellulitis of right leg [L03.115] 05/07/2017  . Right carpal tunnel syndrome [G56.01] 02/16/2017  . Gait disturbance [R26.9] 01/22/2017  . Insomnia [G47.00] 01/22/2017  . Depression with anxiety [F41.8] 01/22/2017  . High risk medication use [Z79.899] 01/22/2017  . Major depressive disorder, recurrent episode ([place]) [F33.9] 12/19/2016  . Suicidal ideation [R45.851]   . AKI (acute kidney injury) ([place]) [N17.9] 11/15/2016  . Chronic hepatitis C virus infection ([place]) [B18.2] 11/15/2016  . Chronic abdominal pain [R10.9, G89.29] 11/15/2016  . Chronic pain syndrome [G89.4]  11/15/2016  . Acute retention of urine [R33.8] 11/15/2016  . Generalized abdominal pain [R10.84]   . Chronic cluster headache, not intractable [G44.029]   . Community acquired pneumonia [J18.9]   . 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 HCAP (healthcare-associated pneumonia) [J18.9] 02/16/2016  . Hypokalemia [E87.6] 02/16/2016  . Intractable cluster headache syndrome [G44.001]   . DVT (deep venous thrombosis) ([place]) [I82.409]   . Depression [F32.9]   . GERD (gastroesophageal reflux disease) [K21.9]   . HTN (hypertension) [I10]   . Essential hypertension [I10]   . Gastroesophageal reflux disease without esophagitis [K21.9]   . CAP (community acquired pneumonia) [J18.9]   . Multiple sclerosis ([place]) [G35] 08/02/2013  . ABDOMINAL BLOATING [R14.3, R14.1, R14.2] 10/14/2010  . LOOSE STOOLS [R19.7] 10/14/2010  . PULMONARY EMBOLISM, HX OF [Z86.718] 10/14/2010   Total Time spent with patient: 30 minutes  Past Psychiatric History: see H&P  Past Medical History:  Past Medical History:  Diagnosis Date  . Abdominal pain, unspecified site   . Anxiety   . Arthritis   . Benign paroxysmal positional vertigo   . Cellulitis and abscess of right leg 04/2017  . Chronic back pain   . Chronic pain syndrome 01/25/2008  . Cluster headache   . Depression    takes Zoloft daily  . DVT (deep venous thrombosis) ([facility])    in the left arm '09  . Gait abnormality    "uses mobile wheelchair, but is ambulatory"  . Gallstones 02/17/2009   resolved after gallbladder surgery.  [name] GERD (gastroesophageal reflux disease)    takes Omeprazole as needed  . Headache(784.0)  cluster headaches frequently-takes Topamax daily  . History of colonoscopy   . HTN (hypertension)    takes Lisinopril,Verapamil,and Triamterene HCTZ daily  . Insomnia 11/06/2008  . Joint pain   . Joint swelling    03-07-16 "swelling of right wrist" "after a fall-xray done 03-06-16 "no fractures".  . Memory loss    no an issue at present 03-07-16  .  Multiple sclerosis ([place])    Dx. 2005 - Dr. Dellis Filbert follows [place] 4'17 tx. Tysabri monthly IV-[facility] , [place].  [name] Nonspecific elevation of levels of transaminase or lactic acid dehydrogenase (LDH)   . Other specified visual disturbances   . Other syndromes affecting cervical region   . Pneumonia 2009  . Trigeminal neuralgia     history" Multiple sclerosis"    Past Surgical History:  Procedure Laterality Date  . CHOLECYSTECTOMY  02/20/2009  . COLONOSCOPY WITH PROPOFOL N/A 03/17/2016   Procedure: COLONOSCOPY WITH PROPOFOL;  Surgeon: Laurence Spates, MD;  Location: [facility];  Service: Endoscopy;  Laterality: N/A;  . PORT A CATH REVISION N/A 07/06/2015   Procedure: Removal and replacement of PORT A CATH;  Surgeon: Fanny Skates, MD;  Location: [place];  Service: General;  Laterality: N/A;  . PORTACATH PLACEMENT N/A 03/27/2014   Procedure: INSERTION PORT-A-CATH;  Surgeon: Adin Hector, MD;  Location: [place];  Service: General;  Laterality: N/A;   Family History:  Family History  Problem Relation Age of Onset  . Cancer Father   . Diabetes Mother    Family Psychiatric  History: see H&P Social History:  Social History   Substance and Sexual Activity  Alcohol Use Yes  . Alcohol/week: 0.0 oz   Comment: occasional     Social History   Substance and Sexual Activity  Drug Use No   Comment: Quit 2011    Social History   Socioeconomic History  . Marital status: Divorced    Spouse name: None  . Number of children: 3  . Years of education: 37 th  . Highest education level: None  Social Needs  . Financial resource strain: None  . Food insecurity - worry: None  . Food insecurity - inability: None  . Transportation needs - medical: None  . Transportation needs - non-medical: None  Occupational History    Comment: Disabled  Tobacco Use  . Smoking status: Former Smoker    Packs/day: 1.00    Years: 38.00    Pack years: 38.00    Types: Cigarettes  . Smokeless  tobacco: Never Used  . Tobacco comment: cutting back  Substance and Sexual Activity  . Alcohol use: Yes    Alcohol/week: 0.0 oz    Comment: occasional  . Drug use: No    Comment: Quit 2011  . Sexual activity: None  Other Topics Concern  . None  Social History Narrative   Patient is divorced. Patient is disabled. Because of his MS. Patient has 11 th grade education. 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    Right handed.   Caffeine- One daily.   Additional Social History:    Pain Medications: See MAR Prescriptions: See MAR Over the Counter: See MAR History of alcohol / drug use?: Yes(drink occasionally) Longest period of sobriety (when/how long): Three years                    Sleep: Fair  Appetite:  Fair  Current Medications: Current Facility-Administered Medications  Medication Dose Route Frequency  Provider Last Rate Last Dose  . acetaminophen (TYLENOL) tablet 650 mg  650 mg Oral Q6H PRN Lindon Romp A, NP      . alum & mag hydroxide-simeth (MAALOX/MYLANTA) 200-200-20 MG/5ML suspension 30 mL  30 mL Oral Q4H PRN Lindon Romp A, NP      . ARIPiprazole (ABILIFY) tablet 2 mg  2 mg Oral Daily [name], [name], MD   2 mg at 10/06/17 2863  . baclofen (LIORESAL) tablet 20 mg  20 mg Oral QID Lindon Romp A, NP   20 mg at 10/06/17 1209  . dalfampridine TB12 10 mg  10 mg Oral BID Lindon Romp A, NP      . dicyclomine (BENTYL) tablet 20 mg  20 mg Oral Q6H PRN Lindon Romp A, NP      . gabapentin (NEURONTIN) tablet 600 mg  600 mg Oral QID Lindon Romp A, NP   600 mg at 10/06/17 1212  . hydrOXYzine (ATARAX/VISTARIL) tablet 25 mg  25 mg Oral Q6H PRN Lindon Romp A, NP   25 mg at 10/05/17 2321  . magnesium hydroxide (MILK OF MAGNESIA) suspension 30 mL  30 mL Oral Daily PRN Lindon Romp A, NP      . nortriptyline (PAMELOR) capsule 50 mg  50 mg Oral QHS Norman Clay, MD   50 mg at 10/05/17 2133  . Oxcarbazepine (TRILEPTAL) tablet 300 mg  300 mg Oral TID  Lindon Romp A, NP   300 mg at 10/06/17 1212  . oxyCODONE-acetaminophen (PERCOCET/ROXICET) 5-325 MG per tablet 1 tablet  1 tablet Oral Q4H PRN Pennelope Bracken, MD   1 tablet at 10/06/17 8177   And  . oxyCODONE (Oxy IR/ROXICODONE) immediate release tablet 5 mg  5 mg Oral Q4H PRN Pennelope Bracken, MD   5 mg at 10/06/17 0826  . pantoprazole (PROTONIX) EC tablet 40 mg  40 mg Oral Daily Lindon Romp A, NP   40 mg at 10/06/17 0825  . potassium chloride SA (K-DUR,KLOR-CON) CR tablet 20 mEq  20 mEq Oral Daily Lindon Romp A, NP   20 mEq at 10/06/17 0825  . rivaroxaban (XARELTO) tablet 10 mg  10 mg Oral Daily Lindon Romp A, NP   10 mg at 10/06/17 0825  . sertraline (ZOLOFT) tablet 50 mg  50 mg Oral Daily Lindon Romp A, NP   50 mg at 10/06/17 0825  . topiramate (TOPAMAX) tablet 25 mg  25 mg Oral Q24H Pennelope Bracken, MD   25 mg at 10/05/17 1526  . topiramate (TOPAMAX) tablet 50 mg  50 mg Oral BID Lindon Romp A, NP   50 mg at 10/06/17 0826  . triamterene-hydrochlorothiazide (MAXZIDE-25) 37.5-25 MG per tablet 1 tablet  1 tablet Oral Daily Lindon Romp A, NP   1 tablet at 10/06/17 [phone]  . verapamil (CALAN) tablet 80 mg  80 mg Oral BID Lindon Romp A, NP   80 mg at 10/06/17 0825   Facility-Administered Medications Ordered in Other Encounters  Medication Dose Route Frequency Provider Last Rate Last Dose  . sodium chloride 0.9 % injection 10 mL  10 mL Intracatheter PRN Evlyn Kanner, NP   10 mL at 11/07/15 1012    Lab Results:  Results for orders placed or performed during the hospital encounter of 10/04/17 (from the past 48 hour(s))  Comprehensive metabolic panel     Status: Abnormal   Collection Time: 10/06/17  6:52 AM  Result Value Ref Range   Sodium 142 135 - 145 mmol/L  Potassium 3.6 3.5 - 5.1 mmol/L   Chloride 114 (H) 101 - 111 mmol/L   CO2 22 22 - 32 mmol/L   Glucose, Bld 95 65 - 99 mg/dL   BUN 11 6 - 20 mg/dL   Creatinine, Ser 0.70 0.61 - 1.24 mg/dL   Calcium  8.9 8.9 - 10.3 mg/dL   Total Protein 6.2 (L) 6.5 - 8.1 g/dL   Albumin 3.5 3.5 - 5.0 g/dL   AST 12 (L) 15 - 41 U/L   ALT 13 (L) 17 - 63 U/L   Alkaline Phosphatase 89 38 - 126 U/L   Total Bilirubin 0.2 (L) 0.3 - 1.2 mg/dL   GFR calc non Af Amer >60 >60 mL/min   GFR calc Af Amer >60 >60 mL/min    Comment: (NOTE) The eGFR has been calculated using the CKD EPI equation. This calculation has not been validated in all clinical situations. eGFR's persistently <60 mL/min signify possible Chronic Kidney Disease.    Anion gap 6 5 - 15    Comment: Performed at [facility], [place] [address] [place], [place] [address]    Blood Alcohol level:  Lab Results  Component Value Date   [facility] <5 12/17/2016   ETH <5 60/45/4098    Metabolic Disorder Labs: Lab Results  Component Value Date   HGBA1C 5.7 (H) 10/04/2017   MPG 117 10/04/2017   MPG 123 11/16/2016   No results found for: PROLACTIN Lab Results  Component Value Date   CHOL 105 10/04/2017   TRIG 75 10/04/2017   HDL 39 (L) 10/04/2017   CHOLHDL 2.7 10/04/2017   VLDL 15 10/04/2017   LDLCALC 51 10/04/2017    Physical Findings: AIMS: Facial and Oral Movements Muscles of Facial Expression: None, normal Lips and Perioral Area: None, normal Jaw: None, normal Tongue: None, normal,Extremity Movements Upper (arms, wrists, hands, fingers): None, normal Lower (legs, knees, ankles, toes): None, normal, Trunk Movements Neck, shoulders, hips: None, normal, Overall Severity Severity of abnormal movements (highest score from questions above): None, normal Incapacitation due to abnormal movements: None, normal Patient's awareness of abnormal movements (rate only patient's report): No Awareness, Dental Status Current problems with teeth and/or dentures?: No Does patient usually wear dentures?: No  CIWA:    COWS:     Musculoskeletal: Strength & Muscle Tone: within normal limits Gait & Station: normal Patient leans:  N/A  Psychiatric Specialty Exam: Physical Exam  Nursing note and vitals reviewed.   Review of Systems  Constitutional: Negative for chills and fever.  Respiratory: Negative for cough.   Cardiovascular: Negative for chest pain.  Gastrointestinal: Negative for heartburn and nausea.  Psychiatric/Behavioral: Positive for depression and hallucinations. Negative for suicidal ideas.    Blood pressure 117/73, pulse 66, temperature 98.3 F (36.8 C), temperature source Oral, resp. rate 16, height 5' 9.02" (1.753 m), weight 83.5 kg (184 lb), SpO2 94 %.Body mass index is 27.16 kg/m.  General Appearance: Casual  Eye Contact:  Good  Speech:  Blocked, Clear and Coherent and Normal Rate  Volume:  Normal  Mood:  Anxious and Depressed  Affect:  Congruent and Constricted  Thought Process:  Coherent and Goal Directed  Orientation:  Full (Time, Place, and Person)  Thought Content:  Hallucinations: Visual and Rumination  Suicidal Thoughts:  No  Homicidal Thoughts:  Yes.  without intent/plan  Memory:  Immediate;   Good Recent;   Good Remote;   Good  Judgement:  Impaired  Insight:  Lacking  Psychomotor Activity:  Normal  Concentration:  Concentration: Good  Recall:  Good  Fund of Knowledge:  Good  Language:  Good  Akathisia:  No  Handed:    AIMS (if indicated):     Assets:  Communication Skills Resilience Social Support Talents/Skills  ADL's:  Intact  Cognition:  WNL  Sleep:  Number of Hours: 6.75     Treatment Plan Summary: Daily contact with patient to assess and evaluate symptoms and progress in treatment and Medication management. Pt reports ongoing depression, HI, and VH. He is in agreement to increase dose of abilify and continue other medications without changes at this time.  - Continue inpatient hospitalization  - Major depression.   -Continue Sertraline 50 mg pr daily.     -Change Abilify 2 mg qDay to Abilify 61m qDay for mood control/adjunct treatment for depression.    -Continue Topamax 25 mg & 50 mg respectively for mood Stabilization.   -Continue Trileptal 300 mg tid po for mood stabilization.     -Continue Pamelor 50 mg daily for depression/insomnia.  -Medical issues/pain.   -Continue all other pertinent medication regimen as recommended.  -Encourage group session attendance & recommendation. -Sw to continue to work on the discharge disposition. -Patient is currently using wheel chair to aid mobility.    CPennelope Bracken MD 10/06/2017, 12:34 PM

## 2017-10-06 NOTE — Plan of Care (Signed)
Pt remains a high fall risk (wheelchair by bedside), passive for SI but verbal contracts for safety.

## 2017-10-06 NOTE — Progress Notes (Signed)
Patient reports to other staff member that he had a fall. When assessed patient he states that he was actually trying to communicate that he did not fall however almost fell when attempting to get in his wheelchair. Currently in day room sitting in wheelchair. Vital signs obtained and within normal limits. Encouraged to express needs for assistance when ambulating from wheelchair to bed and vice versa. Patient reports that he has no difficulty getting into bed, however getting out of bed for the first time during of day is more difficult. Reports that subsequent transfers from bed to wheelchair he can do without difficulty. Pleasant, cooperative, smiling and safe and in dayroom at this time. Spo2: 100, P: 74, BP: 125/79, T: 98.2.

## 2017-10-06 NOTE — Progress Notes (Signed)
  DATA ACTION RESPONSE  Objective- Pt. is visible in the dayroom, seen interacting with peers. Presents with an animated affect and mood. Pt states he is worried about Abilify  increasing; states it makes him feel drowsy throughout day. Pt is encourage to seek help if needing assistance to prevent a fall.  Subjective- Denies having any HI/AVH at this time. Endorses passive SI but contracts for safety. Rates pain 8/10; Generalized. Is cooperative and remain safe on the unit.  1:1 interaction in private to establish rapport. Encouragement, education, & support given from staff.  PRN percocet and roxicodone requested and will re-eval accordingly.   Safety maintained with Q 15 checks. Continue with POC.

## 2017-10-06 NOTE — Progress Notes (Signed)
Recreation Therapy Notes  Animal-Assisted Activity (AAA) Program Checklist/Progress Notes Patient Eligibility Criteria Checklist & Daily Group note for Rec TxIntervention  Date: 11.27.2018 Time: 2:45pm Location: Adult Unit  AAA/T Program Assumption of Risk Form signed by Patient/ or Parent Legal Guardian Yes  Patient is free of allergies or sever asthma Yes  Patient reports no fear of animals Yes  Patient reports no history of cruelty to animals Yes  Patient understands his/her participation is voluntary Yes  Behavioral Response: Did not attend.   Marykay Lex Ercel Normoyle, LRT/CTRS        Jearl Klinefelter 10/06/2017 3:31 PM

## 2017-10-06 NOTE — Progress Notes (Signed)
Patient attended AA group meeting tonight.  

## 2017-10-06 NOTE — Progress Notes (Signed)
D: Patient alert and oriented. Observed lying in bed during interaction, sleeping intermittently throughout the day. Patient continues to report generalized pain and requests PRN pain medication q4h. Patient reports some relief after medication administration.  Denies SI, HI, AVH at this time. Patient verbalizes that he is too weak to get up and make a phone call, and asks this writer to call his daughter for him pertaining to a personal matter of which he did not elaborate about at the time. Patient is at this time encouraged to try to get out of the bed as this may help him feel better. Patient agrees and verbalizes that he will try in a "little bit". Reports eating well, having meal tray brought to him.  A: Scheduled medications administered to patient per MD order. Support and encouragement provided. Routine safety checks conducted every 15 minutes. Encouraged to talk to staff if overwhelming feelings of harm toward self or others arise, patient agrees.  R: No adverse drug reactions noted. Patient contracts for safety at this time. Patient compliant with medications and treatment plan. Patient receptive and cooperative, continues to remain safe and resting in his bed at this time.

## 2017-10-07 MED ORDER — ARIPIPRAZOLE 10 MG PO TABS
10.0000 mg | ORAL_TABLET | Freq: Every day | ORAL | Status: DC
  Administered 2017-10-08: 10 mg via ORAL
  Filled 2017-10-07 (×3): qty 1

## 2017-10-07 NOTE — Progress Notes (Signed)
[facility] MD Progress Note  10/07/2017 2:44 PM [name]  MRN:  [id number]  Subjective: [name] reports, "I'm doing fine today. I feel like I'm ready to be discharged from here. The social worker is working on placing me in a shelter in [place], [place] area. That is where I used to live. I have got no complaints today. No HI towards any one".  Objective: [name] is a 56 y/o M with history of MDD with psychotic features and MS who was admitted with worsening depression, SI, and HI. Pt had initially presented with worsening of MS symptoms, but then he also expressed HI towards his nephew due to feeling that his nephew "set me up." He initially reported CAH to kill his nephew and VH of seeing things. He was restarted on psychotropic medications and admitted to [facility]. Today 11- 28-18; upon evaluation, he continues to have some disorganized aspects to his speech, however, reports improved mood, absence of homicidal ideations. He denies any intent or plans of following through with his original HI thoughts. He denies SI. He denies AVH  Pt reports that he is sleeping well. His appetite is good. The abilify was increased to combat symptoms of psychosis and pt was in agreement. He had no further questions, comments, or concerns. He is hoping to get back to [place] area after discharge.  Principal Problem: Severe major depression, single episode, with psychotic features ([place])  Diagnosis:   Patient Active Problem List   Diagnosis Date Noted  . Severe major depression, single episode, with psychotic features ([place]) [F32.3] 10/04/2017  . Weakness [R53.1]   . Cellulitis of right leg [L03.115] 05/07/2017  . Right carpal tunnel syndrome [G56.01] 02/16/2017  . Gait disturbance [R26.9] 01/22/2017  . Insomnia [G47.00] 01/22/2017  . Depression with anxiety [F41.8] 01/22/2017  . High risk medication use [Z79.899] 01/22/2017  . Major depressive disorder, recurrent episode ([place]) [F33.9] 12/19/2016  . Suicidal  ideation [R45.851]   . AKI (acute kidney injury) ([place]) [N17.9] 11/15/2016  . Chronic hepatitis C virus infection ([place]) [B18.2] 11/15/2016  . Chronic abdominal pain [R10.9, G89.29] 11/15/2016  . Chronic pain syndrome [G89.4] 11/15/2016  . Acute retention of urine [R33.8] 11/15/2016  . Generalized abdominal pain [R10.84]   . Chronic cluster headache, not intractable [G44.029]   . Community acquired pneumonia [J18.9]   . 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 HCAP (healthcare-associated pneumonia) [J18.9] 02/16/2016  . Hypokalemia [E87.6] 02/16/2016  . Intractable cluster headache syndrome [G44.001]   . DVT (deep venous thrombosis) ([place]) [I82.409]   . Depression [F32.9]   . GERD (gastroesophageal reflux disease) [K21.9]   . HTN (hypertension) [I10]   . Essential hypertension [I10]   . Gastroesophageal reflux disease without esophagitis [K21.9]   . CAP (community acquired pneumonia) [J18.9]   . Multiple sclerosis ([place]) [G35] 08/02/2013  . ABDOMINAL BLOATING [R14.3, R14.1, R14.2] 10/14/2010  . LOOSE STOOLS [R19.7] 10/14/2010  . PULMONARY EMBOLISM, HX OF [Z86.718] 10/14/2010   Total Time spent with patient: 15 minutes  Past Psychiatric History: see H&P  Past Medical History:  Past Medical History:  Diagnosis Date  . Abdominal pain, unspecified site   . Anxiety   . Arthritis   . Benign paroxysmal positional vertigo   . Cellulitis and abscess of right leg 04/2017  . Chronic back pain   . Chronic pain syndrome 01/25/2008  . Cluster headache   . Depression    takes Zoloft daily  . DVT (deep venous thrombosis) (H[facility])    in the left  arm '09  . Gait abnormality    "uses mobile wheelchair, but is ambulatory"  . Gallstones 02/17/2009   resolved after gallbladder surgery.  [name] GERD (gastroesophageal reflux disease)    takes Omeprazole as needed  . Headache(784.0)    cluster headaches frequently-takes Topamax daily  . History of colonoscopy   . HTN (hypertension)    takes  Lisinopril,Verapamil,and Triamterene HCTZ daily  . Insomnia 11/06/2008  . Joint pain   . Joint swelling    03-07-16 "swelling of right wrist" "after a fall-xray done 03-06-16 "no fractures".  . Memory loss    no an issue at present 03-07-16  . Multiple sclerosis ([place])    Dx. 2005 - Dr. Dellis Filbert follows [place] 4'17 tx. Tysabri monthly IV-[facility] , [place].  [name] Nonspecific elevation of levels of transaminase or lactic acid dehydrogenase (LDH)   . Other specified visual disturbances   . Other syndromes affecting cervical region   . Pneumonia 2009  . Trigeminal neuralgia     history" Multiple sclerosis"    Past Surgical History:  Procedure Laterality Date  . CHOLECYSTECTOMY  02/20/2009  . COLONOSCOPY WITH PROPOFOL N/A 03/17/2016   Procedure: COLONOSCOPY WITH PROPOFOL;  Surgeon: Laurence Spates, MD;  Location: [facility];  Service: Endoscopy;  Laterality: N/A;  . PORT A CATH REVISION N/A 07/06/2015   Procedure: Removal and replacement of PORT A CATH;  Surgeon: Fanny Skates, MD;  Location: [place];  Service: General;  Laterality: N/A;  . PORTACATH PLACEMENT N/A 03/27/2014   Procedure: INSERTION PORT-A-CATH;  Surgeon: Adin Hector, MD;  Location: [place];  Service: General;  Laterality: N/A;   Family History:  Family History  Problem Relation Age of Onset  . Cancer Father   . Diabetes Mother    Family Psychiatric  History: see H&P Social History:  Social History   Substance and Sexual Activity  Alcohol Use Yes  . Alcohol/week: 0.0 oz   Comment: occasional     Social History   Substance and Sexual Activity  Drug Use No   Comment: Quit 2011    Social History   Socioeconomic History  . Marital status: Divorced    Spouse name: None  . Number of children: 3  . Years of education: 59 th  . Highest education level: None  Social Needs  . Financial resource strain: None  . Food insecurity - worry: None  . Food insecurity - inability: None  . Transportation needs -  medical: None  . Transportation needs - non-medical: None  Occupational History    Comment: Disabled  Tobacco Use  . Smoking status: Former Smoker    Packs/day: 1.00    Years: 38.00    Pack years: 38.00    Types: Cigarettes  . Smokeless tobacco: Never Used  . Tobacco comment: cutting back  Substance and Sexual Activity  . Alcohol use: Yes    Alcohol/week: 0.0 oz    Comment: occasional  . Drug use: No    Comment: Quit 2011  . Sexual activity: None  Other Topics Concern  . None  Social History Narrative   Patient is divorced. Patient is disabled. Because of his MS. Patient has 11 th grade education. 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    Right handed.   Caffeine- One daily.   Additional Social History:    Pain Medications: See MAR Prescriptions: See MAR Over the Counter: See MAR History of alcohol / drug use?: Yes(drink occasionally)  Longest period of sobriety (when/how long): Three years  Sleep: Fair  Appetite:  Fair  Current Medications: Current Facility-Administered Medications  Medication Dose Route Frequency Provider Last Rate Last Dose  . acetaminophen (TYLENOL) tablet 650 mg  650 mg Oral Q6H PRN Lindon Romp A, NP      . alum & mag hydroxide-simeth (MAALOX/MYLANTA) 200-200-20 MG/5ML suspension 30 mL  30 mL Oral Q4H PRN Lindon Romp A, NP      . ARIPiprazole (ABILIFY) tablet 10 mg  10 mg Oral Daily Pennelope Bracken, MD   10 mg at 10/07/17 0902  . baclofen (LIORESAL) tablet 20 mg  20 mg Oral QID Lindon Romp A, NP   20 mg at 10/07/17 1258  . dalfampridine TB12 10 mg  10 mg Oral BID Lindon Romp A, NP      . dicyclomine (BENTYL) tablet 20 mg  20 mg Oral Q6H PRN Lindon Romp A, NP      . gabapentin (NEURONTIN) tablet 600 mg  600 mg Oral QID Lindon Romp A, NP   600 mg at 10/07/17 1258  . hydrOXYzine (ATARAX/VISTARIL) tablet 25 mg  25 mg Oral Q6H PRN Lindon Romp A, NP   25 mg at 10/05/17 2321  . magnesium hydroxide (MILK OF  MAGNESIA) suspension 30 mL  30 mL Oral Daily PRN Lindon Romp A, NP      . nortriptyline (PAMELOR) capsule 50 mg  50 mg Oral QHS Norman Clay, MD   50 mg at 10/06/17 2117  . Oxcarbazepine (TRILEPTAL) tablet 300 mg  300 mg Oral TID Lindon Romp A, NP   300 mg at 10/07/17 1258  . oxyCODONE-acetaminophen (PERCOCET/ROXICET) 5-325 MG per tablet 1 tablet  1 tablet Oral Q4H PRN Pennelope Bracken, MD   1 tablet at 10/07/17 1130   And  . oxyCODONE (Oxy IR/ROXICODONE) immediate release tablet 5 mg  5 mg Oral Q4H PRN Pennelope Bracken, MD   5 mg at 10/07/17 1129  . pantoprazole (PROTONIX) EC tablet 40 mg  40 mg Oral Daily Lindon Romp A, NP   40 mg at 10/07/17 0854  . potassium chloride SA (K-DUR,KLOR-CON) CR tablet 20 mEq  20 mEq Oral Daily Lindon Romp A, NP   20 mEq at 10/07/17 0854  . rivaroxaban (XARELTO) tablet 10 mg  10 mg Oral Daily Lindon Romp A, NP   10 mg at 10/07/17 0856  . sertraline (ZOLOFT) tablet 50 mg  50 mg Oral Daily Lindon Romp A, NP   50 mg at 10/07/17 0850  . topiramate (TOPAMAX) tablet 25 mg  25 mg Oral Q24H Pennelope Bracken, MD   25 mg at 10/06/17 1459  . topiramate (TOPAMAX) tablet 50 mg  50 mg Oral BID Lindon Romp A, NP   50 mg at 10/07/17 0851  . triamterene-hydrochlorothiazide (MAXZIDE-25) 37.5-25 MG per tablet 1 tablet  1 tablet Oral Daily Lindon Romp A, NP   1 tablet at 10/07/17 0856  . verapamil (CALAN) tablet 80 mg  80 mg Oral BID Lindon Romp A, NP   80 mg at 10/07/17 4536   Facility-Administered Medications Ordered in Other Encounters  Medication Dose Route Frequency Provider Last Rate Last Dose  . sodium chloride 0.9 % injection 10 mL  10 mL Intracatheter PRN Evlyn Kanner, NP   10 mL at 11/07/15 1012   Lab Results:  Results for orders placed or performed during the hospital encounter of 10/04/17 (from the past 48 hour(s))  Comprehensive metabolic panel  Status: Abnormal   Collection Time: 10/06/17  6:52 AM  Result Value Ref Range    Sodium 142 135 - 145 mmol/L   Potassium 3.6 3.5 - 5.1 mmol/L   Chloride 114 (H) 101 - 111 mmol/L   CO2 22 22 - 32 mmol/L   Glucose, Bld 95 65 - 99 mg/dL   BUN 11 6 - 20 mg/dL   Creatinine, Ser 0.70 0.61 - 1.24 mg/dL   Calcium 8.9 8.9 - 10.3 mg/dL   Total Protein 6.2 (L) 6.5 - 8.1 g/dL   Albumin 3.5 3.5 - 5.0 g/dL   AST 12 (L) 15 - 41 U/L   ALT 13 (L) 17 - 63 U/L   Alkaline Phosphatase 89 38 - 126 U/L   Total Bilirubin 0.2 (L) 0.3 - 1.2 mg/dL   GFR calc non Af Amer >60 >60 mL/min   GFR calc Af Amer >60 >60 mL/min    Comment: (NOTE) The eGFR has been calculated using the CKD EPI equation. This calculation has not been validated in all clinical situations. eGFR's persistently <60 mL/min signify possible Chronic Kidney Disease.    Anion gap 6 5 - 15    Comment: Performed at [facility], [place] [address] [place], [place] [address]   Blood Alcohol level:  Lab Results  Component Value Date   [facility] <5 12/17/2016   ETH <5 67/61/9509   Metabolic Disorder Labs: Lab Results  Component Value Date   HGBA1C 5.7 (H) 10/04/2017   MPG 117 10/04/2017   MPG 123 11/16/2016   No results found for: PROLACTIN Lab Results  Component Value Date   CHOL 105 10/04/2017   TRIG 75 10/04/2017   HDL 39 (L) 10/04/2017   CHOLHDL 2.7 10/04/2017   VLDL 15 10/04/2017   LDLCALC 51 10/04/2017   Physical Findings: AIMS: Facial and Oral Movements Muscles of Facial Expression: None, normal Lips and Perioral Area: None, normal Jaw: None, normal Tongue: None, normal,Extremity Movements Upper (arms, wrists, hands, fingers): None, normal Lower (legs, knees, ankles, toes): None, normal, Trunk Movements Neck, shoulders, hips: None, normal, Overall Severity Severity of abnormal movements (highest score from questions above): None, normal Incapacitation due to abnormal movements: None, normal Patient's awareness of abnormal movements (rate only patient's report): No Awareness, Dental  Status Current problems with teeth and/or dentures?: No Does patient usually wear dentures?: No  CIWA:    COWS:     Musculoskeletal: Strength & Muscle Tone: within normal limits Gait & Station: normal Patient leans: N/A  Psychiatric Specialty Exam: Physical Exam  Nursing note and vitals reviewed.   Review of Systems  Constitutional: Negative for chills and fever.  Respiratory: Negative for cough.   Cardiovascular: Negative for chest pain.  Gastrointestinal: Negative for heartburn and nausea.  Psychiatric/Behavioral: Positive for depression and hallucinations. Negative for suicidal ideas.    Blood pressure 128/78, pulse 64, temperature 98.2 F (36.8 C), resp. rate 16, height 5' 9.02" (1.753 m), weight 83.5 kg (184 lb), SpO2 100 %.Body mass index is 27.16 kg/m.  General Appearance: Casual  Eye Contact:  Good  Speech:  Blocked, Clear and Coherent and Normal Rate  Volume:  Normal  Mood:  Anxious and Depressed  Affect:  Congruent and Constricted  Thought Process:  Coherent and Goal Directed  Orientation:  Full (Time, Place, and Person)  Thought Content:  Hallucinations: Visual and Rumination  Suicidal Thoughts:  No  Homicidal Thoughts:  Yes.  without intent/plan  Memory:  Immediate;   Good Recent;  Good Remote;   Good  Judgement:  Impaired  Insight:  Lacking  Psychomotor Activity:  Normal  Concentration:  Concentration: Good  Recall:  Good  Fund of Knowledge:  Good  Language:  Good  Akathisia:  No  Handed:    AIMS (if indicated):     Assets:  Communication Skills Resilience Social Support Talents/Skills  ADL's:  Intact  Cognition:  WNL  Sleep:  Number of Hours: 5.5   Treatment Plan Summary: Daily contact with patient to assess and evaluate symptoms and progress in treatment and Medication management. Pt reports oimproved depression symptoms today. Denies HI & says VH is very faint today. He requested to change the Abilify 10 mg daily dosing time to bedtime dosing  time due to daytime sedation.  continue other medications without changes at this time.  - Continue inpatient hospitalization.  Will continue today 10/07/2017 plan as below except where it is noted.  - Major depression.   -Continue Sertraline 50 mg pr daily.     -Changed Abilify 10 mg dosing time from Q daily to bedtime for mood control/adjunct treatment for depression per patient request.   -Continue Topamax 25 mg & 50 mg respectively for mood Stabilization.   -Continue Trileptal 300 mg tid po for mood stabilization.     -Continue Pamelor 50 mg daily for depression/insomnia.  -Medical issues/pain.   -Continue all other pertinent home medication regimen as recommended.  -Encourage group session attendance & participation. -Sw to continue to work on the discharge disposition. -Patient is currently using wheel chair to aid mobility.  Encarnacion Slates, NP, PMHNP, FNP-BC. 10/07/2017, 2:44 PMPatient ID: [name], male   DOB: 1961/03/05, 56 y.o.   MRN: [id number]

## 2017-10-07 NOTE — Progress Notes (Signed)
Recreation Therapy Notes  Date: 10/07/17 Time: 0930 Location: 300 Hall Dayroom  Group Topic: Stress Management  Goal Area(s) Addresses:  Patient will verbalize importance of using healthy stress management.  Patient will identify positive emotions associated with healthy stress management.   Intervention: Stress Management  Activity :  [employer].  LRT introduced the stress management technique of meditation.  Patients were to listen and follow along as LRT played meditation from the Calm app.  Education:  Stress Management, Discharge Planning.   Education Outcome: Acknowledges edcuation/In group clarification offered/Needs additional education  Clinical Observations/Feedback: Pt did not attend group.    Caroll Rancher, LRT/CTRS         Caroll Rancher A 10/07/2017 12:59 PM

## 2017-10-07 NOTE — Plan of Care (Signed)
Pt slept 5.5 hrs last night.

## 2017-10-07 NOTE — Progress Notes (Signed)
D) Pt. Affect blunted, but pleasant on approach.  Pt. Reports chronic pain that remains between 7-9 and reports medication helps very little.  Pt. Taking medications as soon as they are available for pain.   Pt. Continues to receive minimal support for ADL's . Speech patterns are noted perseverative at times, repeating words over and over and mumbling at times.   A) Emotional support offered. Pt. Noted joking with peers and staff.  Offered support with ADL's as requested.  R) Pt. Receptive and cooperative with care. Remains safe at this time.

## 2017-10-07 NOTE — Progress Notes (Signed)
  DATA ACTION RESPONSE  Objective- Pt. is visible in the room, seen sleeping while on wheelchair.  Presents with a depressed/irritable affect and mood. Pt is drowsy on approach. Pt had requested for pain medication. Pt did not attend wrap-up group. Pt stated he would like medications brought back to bedside.  Subjective- Denies having any SI/HI/AVH at this time. Rates pain 8/10; generalized. Is cooperative and remain safe on the unit.  1:1 interaction in private to establish rapport. Encouragement, education, & support given from staff.  PRN percocet and roxicodone requested and will re-eval accordingly.   Safety maintained with Q 15 checks. Continue with POC.

## 2017-10-08 NOTE — Progress Notes (Addendum)
  [facility] :  Will you be returning to the same living situation after discharge:  Yes,  temporarily-[place] [facility] [facility]. At discharge, do you have transportation home?: Yes, daughter-will pick him up on Friday, 10/09/17 Do you have the ability to pay for your medications: Yes,  [facility] medicre  Release of information consent forms completed and submitted to medical records by CSW.  Patient to Follow up at: Follow-up Information    [facility] Follow up on 10/12/2017.   Specialty:  Behavioral Health Why:  Hospital follow-up/assessment with Tim for mental health services on this date at 9:15AM. Please bring: Photo ID, insurance card, and list of current medications (hospital discharge paperwork). Thank you.  Contact information: [address] [place] [address] [phone]           Next level of care provider has access to [facility] Link:no  Safety Planning and Suicide Prevention discussed: Yes,  SPE completed with pt's mother. SPI pamphlet and Mobile Crisis information provided to pt.   Have you used any form of tobacco in the last 30 days? (Cigarettes, Smokeless Tobacco, Cigars, and/or Pipes): Yes  Has patient been referred to the Quitline?: Patient refused referral  Patient has been referred for addiction treatment: Yes  Pul[employer] HomesHeather N Smart, LCSW 10/08/2017, 9:17 AM

## 2017-10-08 NOTE — Progress Notes (Signed)
Staff asked pt twice not to sleep sitting in the wheelchair. Staff asked pt to go to his room and lay on the bed to sleep. RN notified.

## 2017-10-08 NOTE — Progress Notes (Signed)
D:  Patient denied SI and HI, contracts for safety.  Denied A/V hallucinations while talking to nurse. A:  Patient refused maxzide this morning.  Patient concerned that medications may cause him to be sleepy.  Patient given oxycodone-acetaminophen 5-325 mg and oxycodone 5 mg at 1041 and at 1644 for body aches.  Emotional support and encouragement given patient. R:  Safety maintained with 15 minute checks. Patient looking forward to discharge tomorrow, did not want to be discharged today.

## 2017-10-08 NOTE — Progress Notes (Signed)
Pt spill urine all over bed sheets this a.m. Writer assisted Pt to clean up. It was observed that Pt leaves both urinals filled by beside with the cap left open. Pt was educated about emptying it before it overflows to prevent a spill/fall.

## 2017-10-08 NOTE — Progress Notes (Signed)
Staff and RN asked pt twice to lay on the bed and not to sleep sitting in the wheelchair. Pt wakes up and said he was going to lay on the bed.

## 2017-10-08 NOTE — Plan of Care (Signed)
Nurse discussed depression, anxiety, coping skills with patient.  

## 2017-10-08 NOTE — Progress Notes (Signed)
[facility] MD Progress Note  10/08/2017 12:40 PM [name]  MRN:  [id number] Subjective:   Gary DaviesJonas Demps is a 56 y/o M with history of MDD with psychotic features who was admitted with worsening depression, SI, and HI. Pt had also reported CAH to kill his nephew, and he continued to report some symptoms of paranoia that his nephew had "set me up." Today upon evaluation, pt reports that he is feeling better overall. He denies SI and he denies specific HI, but he feels concerned that if he were to return to the home where he was living that there would be a violent conflict between him and his nephew. He denies AH, but reports VH of seeing "things melting." He is sleeping well and his medications are helpful. Pt is future oriented about attending the [facility] in [place] and he is planning on completing the phone interview today. He would like to discharge tomorrow so that his daughter can drive him directly to [place] area. Pt would like to continue his medications without changes at this time. He had no further questions, comments, or concerns.  Principal Problem: Severe major depression, single episode, with psychotic features (HCC) Diagnosis:   Patient Active Problem List   Diagnosis Date Noted  . 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 Weakness [R53.1]   . Cellulitis of right leg [L03.115] 05/07/2017  . Right carpal tunnel syndrome [G56.01] 02/16/2017  . Gait disturbance [R26.9] 01/22/2017  . Insomnia [G47.00] 01/22/2017  . Depression with anxiety [F41.8] 01/22/2017  . High risk medication use [Z79.899] 01/22/2017  . Major depressive disorder, recurrent episode (HCC) [F33.9] 12/19/2016  . Suicidal ideation [R45.851]   . AKI (acute kidney injury) (HCC) [N17.9] 11/15/2016  . Chronic hepatitis C virus infection (HCC) [B18.2] 11/15/2016  . Chronic abdominal pain [R10.9, G89.29] 11/15/2016  . Chronic pain syndrome [G89.4]  11/15/2016  . Acute retention of urine [R33.8] 11/15/2016  . Generalized abdominal pain [R10.84]   . Chronic cluster headache, not intractable [G44.029]   . Community acquired pneumonia [J18.9]   . 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 HCAP (healthcare-associated pneumonia) [J18.9] 02/16/2016  . Hypokalemia [E87.6] 02/16/2016  . Intractable cluster headache syndrome [G44.001]   . DVT (deep venous thrombosis) (HCC) [I82.409]   . Depression [F32.9]   . GERD (gastroesophageal reflux disease) [K21.9]   . HTN (hypertension) [I10]   . Essential hypertension [I10]   . Gastroesophageal reflux disease without esophagitis [K21.9]   . CAP (community acquired pneumonia) [J18.9]   . Multiple sclerosis (HCC) [G35] 08/02/2013  . ABDOMINAL BLOATING [R14.3, R14.1, R14.2] 10/14/2010  . LOOSE STOOLS [R19.7] 10/14/2010  . PULMONARY EMBOLISM, HX OF [Z86.718] 10/14/2010   Total Time spent with patient: 30 minutes  Past Psychiatric History: see H&P  Past Medical History:  Past Medical History:  Diagnosis Date  . Abdominal pain, unspecified site   . Anxiety   . Arthritis   . Benign paroxysmal positional vertigo   . Cellulitis and abscess of right leg 04/2017  . Chronic back pain   . Chronic pain syndrome 01/25/2008  . Cluster headache   . Depression    takes Zoloft daily  . DVT (deep venous thrombosis) (HCC)    in the left arm '09  . Gait abnormality    "uses mobile wheelchair, but is ambulatory"  . Gallstones 02/17/2009   resolved after gallbladder surgery.  [name]. GERD (gastroesophageal reflux disease)    takes Omeprazole as needed  . Headache(784.0)  cluster headaches frequently-takes Topamax daily  . History of colonoscopy   . HTN (hypertension)    takes Lisinopril,Verapamil,and Triamterene HCTZ daily  . Insomnia 11/06/2008  . Joint pain   . Joint swelling    03-07-16 "swelling of right wrist" "after a fall-xray done 03-06-16 "no fractures".  . Memory loss    no an issue at present 03-07-16  .  Multiple sclerosis (HCC)    Dx. 2005 - Dr. Tinnie Gens follows LOV 4'17 tx. Tysabri monthly IV-[facility] , [place].  [name] Nonspecific elevation of levels of transaminase or lactic acid dehydrogenase (LDH)   . Other specified visual disturbances   . Other syndromes affecting cervical region   . Pneumonia 2009  . Trigeminal neuralgia     history" Multiple sclerosis"    Past Surgical History:  Procedure Laterality Date  . CHOLECYSTECTOMY  02/20/2009  . COLONOSCOPY WITH PROPOFOL N/A 03/17/2016   Procedure: COLONOSCOPY WITH PROPOFOL;  Surgeon: [name], MD;  Location: [facility];  Service: Endoscopy;  Laterality: N/A;  . PORT A CATH REVISION N/A 07/06/2015   Procedure: Removal and replacement of PORT A CATH;  Surgeon: [name], MD;  Location: [facility] OR;  Service: General;  Laterality: N/A;  . PORTACATH PLACEMENT N/A 03/27/2014   Procedure: INSERTION PORT-A-CATH;  Surgeon: [name], MD;  Location: [facility] OR;  Service: General;  Laterality: N/A;   Family History:  Family History  Problem Relation Age of Onset  . Cancer Father   . Diabetes Mother    Family Psychiatric  History: see H&P Social History:  Social History   Substance and Sexual Activity  Alcohol Use Yes  . Alcohol/week: 0.0 oz   Comment: occasional     Social History   Substance and Sexual Activity  Drug Use No   Comment: Quit 2011    Social History   Socioeconomic History  . Marital status: Divorced    Spouse name: None  . Number of children: 3  . Years of education: 71 th  . Highest education level: None  Social Needs  . Financial resource strain: None  . Food insecurity - worry: None  . Food insecurity - inability: None  . Transportation needs - medical: None  . Transportation needs - non-medical: None  Occupational History    Comment: Disabled  Tobacco Use  . Smoking status: Former Smoker    Packs/day: 1.00    Years: 38.00    Pack years: 38.00    Types: Cigarettes  . Smokeless  tobacco: Never Used  . Tobacco comment: cutting back  Substance and Sexual Activity  . Alcohol use: Yes    Alcohol/week: 0.0 oz    Comment: occasional  . Drug use: No    Comment: Quit 2011  . Sexual activity: None  Other Topics Concern  . None  Social History Narrative   Patient is divorced. Patient is disabled. Because of his MS. Patient has 11 th grade education. 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    Right handed.   Caffeine- One daily.   Additional Social History:    Pain Medications: See MAR Prescriptions: See MAR Over the Counter: See MAR History of alcohol / drug use?: Yes(drink occasionally) Longest period of sobriety (when/how long): Three years                    Sleep: Good  Appetite:  Good  Current Medications: Current Facility-Administered Medications  Medication Dose Route Frequency  Provider Last Rate Last Dose  . acetaminophen (TYLENOL) tablet 650 mg  650 mg Oral Q6H PRN Nira Conn A, NP      . alum & mag hydroxide-simeth (MAALOX/MYLANTA) 200-200-20 MG/5ML suspension 30 mL  30 mL Oral Q4H PRN Nira Conn A, NP      . ARIPiprazole (ABILIFY) tablet 10 mg  10 mg Oral QHS Nwoko, Agnes I, NP      . baclofen (LIORESAL) tablet 20 mg  20 mg Oral QID Nira Conn A, NP   20 mg at 10/08/17 1216  . dalfampridine TB12 10 mg  10 mg Oral BID Nira Conn A, NP      . dicyclomine (BENTYL) tablet 20 mg  20 mg Oral Q6H PRN Nira Conn A, NP      . gabapentin (NEURONTIN) tablet 600 mg  600 mg Oral QID Nira Conn A, NP   600 mg at 10/08/17 1217  . hydrOXYzine (ATARAX/VISTARIL) tablet 25 mg  25 mg Oral Q6H PRN Nira Conn A, NP   25 mg at 10/05/17 2321  . magnesium hydroxide (MILK OF MAGNESIA) suspension 30 mL  30 mL Oral Daily PRN Nira Conn A, NP      . nortriptyline (PAMELOR) capsule 50 mg  50 mg Oral QHS Neysa Hotter, MD   50 mg at 10/07/17 2108  . Oxcarbazepine (TRILEPTAL) tablet 300 mg  300 mg Oral TID Nira Conn A, NP    300 mg at 10/08/17 1216  . oxyCODONE-acetaminophen (PERCOCET/ROXICET) 5-325 MG per tablet 1 tablet  1 tablet Oral Q4H PRN Micheal Likens, MD   1 tablet at 10/08/17 1040   And  . oxyCODONE (Oxy IR/ROXICODONE) immediate release tablet 5 mg  5 mg Oral Q4H PRN Micheal Likens, MD   5 mg at 10/08/17 1041  . pantoprazole (PROTONIX) EC tablet 40 mg  40 mg Oral Daily Nira Conn A, NP   40 mg at 10/08/17 0800  . potassium chloride SA (K-DUR,KLOR-CON) CR tablet 20 mEq  20 mEq Oral Daily Nira Conn A, NP   20 mEq at 10/08/17 0800  . rivaroxaban (XARELTO) tablet 10 mg  10 mg Oral Daily Nira Conn A, NP   10 mg at 10/08/17 0800  . sertraline (ZOLOFT) tablet 50 mg  50 mg Oral Daily Nira Conn A, NP   50 mg at 10/08/17 0801  . topiramate (TOPAMAX) tablet 25 mg  25 mg Oral Q24H Micheal Likens, MD   25 mg at 10/07/17 1610  . topiramate (TOPAMAX) tablet 50 mg  50 mg Oral BID Nira Conn A, NP   50 mg at 10/08/17 0803  . triamterene-hydrochlorothiazide (MAXZIDE-25) 37.5-25 MG per tablet 1 tablet  1 tablet Oral Daily Nira Conn A, NP   1 tablet at 10/07/17 0856  . verapamil (CALAN) tablet 80 mg  80 mg Oral BID Nira Conn A, NP   80 mg at 10/08/17 0801   Facility-Administered Medications Ordered in Other Encounters  Medication Dose Route Frequency Provider Last Rate Last Dose  . sodium chloride 0.9 % injection 10 mL  10 mL Intracatheter PRN Loann Quill, NP   10 mL at 11/07/15 1012    Lab Results: No results found for this or any previous visit (from the past 48 hour(s)).  Blood Alcohol level:  Lab Results  Component Value Date   [facility] <5 12/17/2016   ETH <5 08/23/2015    Metabolic Disorder Labs: Lab Results  Component Value Date   HGBA1C 5.7 (H)  10/04/2017   MPG 117 10/04/2017   MPG 123 11/16/2016   No results found for: PROLACTIN Lab Results  Component Value Date   CHOL 105 10/04/2017   TRIG 75 10/04/2017   HDL 39 (L) 10/04/2017   CHOLHDL 2.7  10/04/2017   VLDL 15 10/04/2017   LDLCALC 51 10/04/2017    Physical Findings: AIMS: Facial and Oral Movements Muscles of Facial Expression: None, normal Lips and Perioral Area: None, normal Jaw: None, normal Tongue: None, normal,Extremity Movements Upper (arms, wrists, hands, fingers): None, normal Lower (legs, knees, ankles, toes): None, normal, Trunk Movements Neck, shoulders, hips: None, normal, Overall Severity Severity of abnormal movements (highest score from questions above): None, normal Incapacitation due to abnormal movements: None, normal Patient's awareness of abnormal movements (rate only patient's report): No Awareness, Dental Status Current problems with teeth and/or dentures?: No Does patient usually wear dentures?: No  CIWA:    COWS:     Musculoskeletal: Strength & Muscle Tone: within normal limits Gait & Station: uses wheelchair Patient leans: N/A  Psychiatric Specialty Exam: Physical Exam  Nursing note and vitals reviewed.   Review of Systems  Constitutional: Negative for chills and fever.  Respiratory: Negative for cough and shortness of breath.   Gastrointestinal: Negative for abdominal pain, heartburn, nausea and vomiting.  Psychiatric/Behavioral: Positive for hallucinations. Negative for depression and suicidal ideas.    Blood pressure (!) 142/89, pulse 96, temperature (!) 97.5 F (36.4 C), temperature source Oral, resp. rate 18, height 5' 9.02" (1.753 m), weight 83.5 kg (184 lb), SpO2 100 %.Body mass index is 27.16 kg/m.  General Appearance: Casual and Disheveled  Eye Contact:  Good  Speech:  Clear and Coherent and Normal Rate  Volume:  Normal  Mood:  Euthymic  Affect:  Congruent and Constricted  Thought Process:  Coherent and Goal Directed  Orientation:  Full (Time, Place, and Person)  Thought Content:  Hallucinations: Visual  Suicidal Thoughts:  No  Homicidal Thoughts:  Yes.  without intent/plan  Memory:  Immediate;   Good Recent;    Good Remote;   Good  Judgement:  Fair  Insight:  Lacking  Psychomotor Activity:  Normal  Concentration:  Concentration: Good  Recall:  Fair  Fund of Knowledge:  Fair  Language:  Fair  Akathisia:  No  Handed:    AIMS (if indicated):     Assets:  Manufacturing systems engineer Physical Health Resilience Social Support  ADL's:  Intact  Cognition:  WNL  Sleep:  Number of Hours: 5     Treatment Plan Summary: Daily contact with patient to assess and evaluate symptoms and progress in treatment and Medication management. Pt reports improvement of his mood and psychotic symptoms, but he is continuing to have some VH. He has some HI towards his nephew but he has no intent of acting on them, but he is concerned that discharging back to living in the same household would be potentially dangerous. Pt would like to discharge tomorrow with plan to go to [facility] in [place].  -Continue inpatient hospitalization - Major depression.             -Continue Sertraline 50 mg pr daily.              -Continue Abilify 10mg  qhs              -Change Topamax 25+50mg  to topamax 50mg  BID             -Continue Trileptal 300 mg tid po for mood  stabilization.             -Continue Pamelor 50 mg daily for depression/insomnia.  -Medical issues/pain.             -Continue all other pertinent home medication regimen as recommended.  -Encourage group session attendance & participation. -Sw to continue to work on the discharge disposition. -Patient is currently using wheel chair to aid mobility.    Micheal Likens, MD 10/08/2017, 12:40 PM

## 2017-10-08 NOTE — Progress Notes (Addendum)
DATA ACTION RESPONSE  Objective- Pt. is visible in the room, seen sleeping in bed with eyes closed.  Presents with a depressed/irritable affect and mood. Pt is drowsy on approach. Pt had requested for pain medication. Pt did not attend wrap-up group. Pt stated he would like medications and snacks/fluids brought back to bedside. Pt is demanding to staff. Writer and staff encorage Pt to independently perform much ADLs to best of ability.  Subjective- Denies having any SI/HI/AVH at this time. Rates pain 8/10; generalized. Is cooperative and remain safe on the unit.  1:1 interaction in private to establish rapport. Encouragement, education, & support given from staff. PRN percocet and roxicodone requested and will re-eval accordingly.   Safety maintained with Q 15 checks. Continue with POC.

## 2017-10-08 NOTE — Progress Notes (Signed)
Staff has redirect pt twice to lay on the bed to sleep. Staff has asked pt not to sit on the side of the bed with his head laying on the wheelchair. Pt awake and said he will lay on the bed. RN notified

## 2017-10-08 NOTE — Progress Notes (Signed)
Pt do not attend wrap up group with NA.

## 2017-10-08 NOTE — Plan of Care (Signed)
Pt did not attend wrap up group this evening.  

## 2017-10-08 NOTE — Progress Notes (Signed)
Patient took topamax this morning but refused all of his other morning medications.  Patient talked to MD and then agreed to take all medications except maxzide medication.  Patient did not want to discharge today.

## 2017-10-08 NOTE — Progress Notes (Addendum)
   10/07/17 2000  Precautions / Armbands  Precautions Fall risk    Pt falls asleep on wheelchair. MHT and writer have encourage Pt multiple times to go lay in bed. Pt denies request. Pt is safe. Will monitor closely.

## 2017-10-08 NOTE — Progress Notes (Signed)
Psychoeducational Group Note  Date:  10/08/2017 Time:  2045  Group Topic/Focus:  wrap up group  Participation Level: Did Not Attend  Participation Quality:  Not Applicable  Affect:  Not Applicable  Cognitive:  Not Applicable  Insight:  Not Applicable  Engagement in Group: Not Applicable  Additional Comments:  Pt was notified that group was beginning but remained in his room.  Marcille BuffyMcNeil, Yatzary Merriweather S 10/08/2017, 9:35 PM

## 2017-10-09 ENCOUNTER — Ambulatory Visit: Payer: Self-pay

## 2017-10-09 MED ORDER — TOPIRAMATE 100 MG PO TABS
100.0000 mg | ORAL_TABLET | Freq: Every day | ORAL | Status: DC
  Filled 2017-10-09 (×2): qty 1

## 2017-10-09 MED ORDER — ARIPIPRAZOLE 10 MG PO TABS: 10.0000 mg | ORAL_TABLET | Freq: Every day | ORAL | 0 refills | Status: DC

## 2017-10-09 MED ORDER — DICYCLOMINE HCL 20 MG PO TABS: 20.0000 mg | ORAL_TABLET | Freq: Four times a day (QID) | ORAL | 0 refills | Status: DC | PRN

## 2017-10-09 MED ORDER — OMEPRAZOLE 40 MG PO CPDR: 40.0000 mg | DELAYED_RELEASE_CAPSULE | Freq: Every evening | ORAL | 0 refills | Status: AC

## 2017-10-09 MED ORDER — SERTRALINE HCL 50 MG PO TABS: 50.0000 mg | ORAL_TABLET | Freq: Every day | ORAL | 0 refills | Status: AC

## 2017-10-09 MED ORDER — BACLOFEN 20 MG PO TABS: 20.0000 mg | ORAL_TABLET | Freq: Four times a day (QID) | ORAL | 0 refills | Status: AC

## 2017-10-09 MED ORDER — OXCARBAZEPINE 300 MG PO TABS: 300.0000 mg | ORAL_TABLET | Freq: Three times a day (TID) | ORAL | 0 refills | Status: AC

## 2017-10-09 MED ORDER — POTASSIUM CHLORIDE CRYS ER 20 MEQ PO TBCR: 20.0000 meq | EXTENDED_RELEASE_TABLET | Freq: Every day | ORAL | 0 refills | Status: DC

## 2017-10-09 MED ORDER — GABAPENTIN 400 MG PO CAPS
400.0000 mg | ORAL_CAPSULE | Freq: Three times a day (TID) | ORAL | Status: DC
  Administered 2017-10-09: 400 mg via ORAL
  Filled 2017-10-09 (×6): qty 1

## 2017-10-09 MED ORDER — NORTRIPTYLINE HCL 50 MG PO CAPS: 50.0000 mg | ORAL_CAPSULE | Freq: Every day | ORAL | 0 refills | Status: DC

## 2017-10-09 MED ORDER — OXYCODONE-ACETAMINOPHEN 10-325 MG PO TABS: 1.0000 | ORAL_TABLET | ORAL | 0 refills | Status: DC | PRN

## 2017-10-09 MED ORDER — TRIAMTERENE-HCTZ 37.5-25 MG PO CAPS: 1.0000 | ORAL_CAPSULE | Freq: Every day | ORAL | 0 refills | Status: DC

## 2017-10-09 MED ORDER — TOPIRAMATE 100 MG PO TABS: 100.0000 mg | ORAL_TABLET | Freq: Every day | ORAL | 0 refills | Status: DC

## 2017-10-09 MED ORDER — HYDROXYZINE HCL 25 MG PO TABS: 25.0000 mg | ORAL_TABLET | Freq: Four times a day (QID) | ORAL | 0 refills | Status: DC | PRN

## 2017-10-09 MED ORDER — VERAPAMIL HCL 80 MG PO TABS: 80.0000 mg | ORAL_TABLET | Freq: Two times a day (BID) | ORAL | 0 refills | Status: DC

## 2017-10-09 MED ORDER — GABAPENTIN 800 MG PO TABS: 400.0000 mg | ORAL_TABLET | Freq: Three times a day (TID) | ORAL | 0 refills | Status: DC

## 2017-10-09 MED ORDER — DALFAMPRIDINE ER 10 MG PO TB12: 10.0000 mg | ORAL_TABLET | Freq: Two times a day (BID) | ORAL | 0 refills | Status: DC

## 2017-10-09 MED ORDER — XARELTO 10 MG PO TABS: 10.0000 mg | ORAL_TABLET | Freq: Every day | ORAL | 0 refills | Status: AC

## 2017-10-09 NOTE — Progress Notes (Signed)
Pt discharged home with is two daughters. Pt was stable and appreciative at that time. All papers and prescriptions were given and valuables returned. Verbal understanding expressed. Denies SI/HI and A/VH. Pt given opportunity to express concerns and ask questions.

## 2017-10-09 NOTE — Progress Notes (Signed)
Recreation Therapy Notes  Date: 10/09/17 Time: 0930 Location: 300 Hall Dayroom  Group Topic: Stress Management  Goal Area(s) Addresses:  Patient will verbalize importance of using healthy stress management.  Patient will identify positive emotions associated with healthy stress management.   Intervention: Stress Management  Activity :  Progressive Muscle Relaxation.  LRT introduced the stress management technique of progressive muscle relaxation.  LRT read a script to allow patients to tense and relax each muscle group one at a time.  Education:  Stress Management, Discharge Planning.   Education Outcome: Acknowledges edcuation/In group clarification offered/Needs additional education  Clinical Observations/Feedback: Pt did not attend group.    Caroll Rancher, LRT/CTRS         Caroll Rancher A 10/09/2017 12:37 PM

## 2017-10-09 NOTE — Progress Notes (Signed)
  [facility] :  Will you be returning to the same living situation after discharge:  No.Pt is hoping to get into [facility] [place].  At discharge, do you have transportation home?: Yes,  daughter Do you have the ability to pay for your medications: Yes,  [place] medicare and [place] med[place]  Release of information consent forms completed and submitted to medical records by CSW.  Patient to Follow up at: Follow-up Information    [facility] Follow up on 10/12/2017.   Specialty:  Behavioral Health Why:  Hospital follow-up/assessment with Tim for mental health services on this date at 9:15AM. Please bring: Photo ID, insurance card, and list of current medications (hospital discharge paperwork). Thank you.  Contact information: [address] [place] [address] [phone]        [facility] Follow up.   Why:  Patient is choosing to apply for possible admission at the [facility] at discharge. (Patient left message for admissions during hospital stay to complete interview but had not heard back by discharge).  Contact information: [address] [place], [place] [address] Phone: [phone] Fax: [phone]          Next level of care provider has [facility] Link:no  Safety Planning and Suicide Prevention discussed: Yes,  SPE completed with pt's mother. SPI pamphlet and Mobile Crisis information provided to pt.   Have you used any form of tobacco in the last 30 days? (Cigarettes, Smokeless Tobacco, Cigars, and/or Pipes): Yes  Has patient been referred to the Quitline?: Patient refused referral  Patient has been referred for addiction treatment: Yes  Pulte HomesHeather N Smart, LCSW 10/09/2017, 9:44 AM

## 2017-10-09 NOTE — Tx Team (Signed)
Interdisciplinary Treatment and Diagnostic Plan Update  10/09/2017 Time of Session: [id number] Gary DaviesJonas Perez MRN: [id number]  Principal Diagnosis: Severe major depression, single episode, with psychotic features [facility](HCC)  Secondary Diagnoses: Principal Problem:   Severe major depression, single episode, with psychotic features (HCC)   Current Medications:  Current Facility-Administered Medications  Medication Dose Route Frequency Provider Last Rate Last Dose  . acetaminophen (TYLENOL) tablet 650 mg  650 mg Oral Q6H PRN [name], [name], NP      . alum & mag hydroxide-simeth (MAALOX/MYLANTA) 200-200-20 MG/5ML suspension 30 mL  30 mL Oral Q4H PRN [name], [name], NP      . ARIPiprazole (ABILIFY) tablet 10 mg  10 mg Oral QHS [name], Agnes I, NP   10 mg at 10/08/17 2110  . baclofen (LIORESAL) tablet 20 mg  20 mg Oral QID [name], [name], NP   20 mg at 10/09/17 0831  . dalfampridine TB12 10 mg  10 mg Oral BID [name], [name], NP      . dicyclomine (BENTYL) tablet 20 mg  20 mg Oral Q6H PRN [name], [name], NP      . gabapentin (NEURONTIN) tablet 600 mg  600 mg Oral QID [name], [name], NP   600 mg at 10/09/17 0831  . hydrOXYzine (ATARAX/VISTARIL) tablet 25 mg  25 mg Oral Q6H PRN [name], [name], NP   25 mg at 10/05/17 2321  . magnesium hydroxide (MILK OF MAGNESIA) suspension 30 mL  30 mL Oral Daily PRN [name], [name], NP      . nortriptyline (PAMELOR) capsule 50 mg  50 mg Oral QHS Neysa HotterHisada, [name], MD   50 mg at 10/08/17 2110  . Oxcarbazepine (TRILEPTAL) tablet 300 mg  300 mg Oral TID [name], [name], NP   300 mg at 10/09/17 0831  . oxyCODONE-acetaminophen (PERCOCET/ROXICET) 5-325 MG per tablet 1 tablet  1 tablet Oral Q4H PRN [name], [name], MD   1 tablet at 10/09/17 [id number]   And  . oxyCODONE (Oxy IR/ROXICODONE) immediate release tablet 5 mg  5 mg Oral Q4H PRN [name], [name], MD   5 mg at 10/09/17 0837  . pantoprazole (PROTONIX) EC tablet 40 mg  40 mg Oral Daily [name], [name], NP   40 mg at  10/09/17 0831  . potassium chloride SA (K-DUR,KLOR-CON) CR tablet 20 mEq  20 mEq Oral Daily [name], [name], NP   20 mEq at 10/09/17 0831  . rivaroxaban (XARELTO) tablet 10 mg  10 mg Oral Daily [name], [name], NP   10 mg at 10/09/17 0831  . sertraline (ZOLOFT) tablet 50 mg  50 mg Oral Daily [name], [name], NP   50 mg at 10/09/17 0831  . topiramate (TOPAMAX) tablet 50 mg  50 mg Oral BID [name], [name], NP   50 mg at 10/09/17 [id number]  . triamterene-hydrochlorothiazide (MAXZIDE-25) 37.5-25 MG per tablet 1 tablet  1 tablet Oral Daily [name], [name], NP   1 tablet at 10/09/17 0831  . verapamil (CALAN) tablet 80 mg  80 mg Oral BID [name], [name], NP   80 mg at 10/09/17 0831   Facility-Administered Medications Ordered in Other Encounters  Medication Dose Route Frequency Provider Last Rate Last Dose  . sodium chloride 0.9 % injection 10 mL  10 mL Intracatheter PRN [name], [name], NP   10 mL at 11/07/15 1012   PTA Medications: Medications Prior to Admission  Medication Sig Dispense Refill Last Dose  . baclofen (LIORESAL) 20  MG tablet Take 20 mg by mouth 4 (four) times daily.    10/03/2017 at Unknown time  . dalfampridine 10 MG TB12 Take 1 tablet (10 mg total) by mouth 2 (two) times daily. 60 tablet 11 10/03/2017 at Unknown time  . dicyclomine (BENTYL) 20 MG tablet Take 20 mg by mouth every 6 (six) hours as needed for spasms.   6 Past Week at Unknown time  . Eszopiclone (ESZOPICLONE) 3 MG TABS Take 3 mg by mouth at bedtime as needed (for sleep).    Past Week at Unknown time  . gabapentin (NEURONTIN) 600 MG tablet Take 600 mg by mouth 4 (four) times daily.    10/03/2017 at Unknown time  . KLOR-CON M20 20 MEQ tablet Take 20 mEq by mouth daily.  2 10/03/2017 at Unknown time  . nortriptyline (PAMELOR) 25 MG capsule Take 1 capsule (25 mg total) by mouth at bedtime. 30 capsule 0 10/02/2017 at Unknown time  . omeprazole (PRILOSEC) 40 MG capsule Take 40 mg by mouth every evening.   4 10/03/2017 at Unknown time   . Oxcarbazepine (TRILEPTAL) 300 MG tablet Take 300 mg by mouth 3 (three) times daily.   10/03/2017 at Unknown time  . oxyCODONE-acetaminophen (PERCOCET) 10-325 MG tablet Take 1 tablet by mouth every 4 (four) hours as needed for pain. 5 tablet 0 10/03/2017 at Unknown time  . sertraline (ZOLOFT) 50 MG tablet Take 50 mg by mouth daily.    10/03/2017 at Unknown time  . topiramate (TOPAMAX) 25 MG tablet Take 1 tablet (25 mg total) by mouth 2 (two) times daily. (Patient taking differently: Take 25-50 mg by mouth See admin instructions. Take 2 tablets every morning and at night then take 1 tablet at 3 pm) 30 tablet 0 10/03/2017 at Unknown time  . triamterene-hydrochlorothiazide (DYAZIDE) 37.5-25 MG capsule Take 1 capsule by mouth daily.  3 10/03/2017 at Unknown time  . verapamil (CALAN) 80 MG tablet Take 80 mg by mouth 2 (two) times daily.   10/03/2017 at Unknown time  . XARELTO 10 MG TABS tablet Take 10 mg by mouth daily.  2 10/03/2017 at 1600    Patient Stressors: Financial difficulties Health problems  Patient Strengths: Capable of independent living Communication skills  Treatment Modalities: Medication Management, Group therapy, Case management,  1 to 1 session with clinician, Psychoeducation, Recreational therapy.   Physician Treatment Plan for Primary Diagnosis: Severe major depression, single episode, with psychotic features (HCC) Long Term Goal(s): Improvement in symptoms so as ready for discharge Improvement in symptoms so as ready for discharge   Short Term Goals: Ability to demonstrate self-control will improve Ability to identify and develop effective coping behaviors will improve Ability to demonstrate self-control will improve Ability to identify and develop effective coping behaviors will improve  Medication Management: Evaluate patient's response, side effects, and tolerance of medication regimen.  Therapeutic Interventions: 1 to 1 sessions, Unit Group sessions and  Medication administration.  Evaluation of Outcomes: Adequate for discharge   Physician Treatment Plan for Secondary Diagnosis: Principal Problem:   Severe major depression, single episode, with psychotic features (HCC)  Long Term Goal(s): Improvement in symptoms so as ready for discharge Improvement in symptoms so as ready for discharge   Short Term Goals: Ability to demonstrate self-control will improve Ability to identify and develop effective coping behaviors will improve Ability to demonstrate self-control will improve Ability to identify and develop effective coping behaviors will improve     Medication Management: Evaluate patient's response, side effects, and tolerance  of medication regimen.  Therapeutic Interventions: 1 to 1 sessions, Unit Group sessions and Medication administration.  Evaluation of Outcomes: Adequate for discharge   RN Treatment Plan for Primary Diagnosis: Severe major depression, single episode, with psychotic features (HCC) Long Term Goal(s): Knowledge of disease and therapeutic regimen to maintain health will improve  Short Term Goals: Ability to remain free from injury will improve, Ability to verbalize feelings will improve and Ability to disclose and discuss suicidal ideas  Medication Management: RN will administer medications as ordered by provider, will assess and evaluate patient's response and provide education to patient for prescribed medication. RN will report any adverse and/or side effects to prescribing provider.  Therapeutic Interventions: 1 on 1 counseling sessions, Psychoeducation, Medication administration, Evaluate responses to treatment, Monitor vital signs and CBGs as ordered, Perform/monitor CIWA, COWS, AIMS and Fall Risk screenings as ordered, Perform wound care treatments as ordered.  Evaluation of Outcomes: Adequate for discharge   LCSW Treatment Plan for Primary Diagnosis: Severe major depression, single episode, with psychotic  features (HCC) Long Term Goal(s): Safe transition to appropriate next level of care at discharge, Engage patient in therapeutic group addressing interpersonal concerns.  Short Term Goals: Engage patient in aftercare planning with referrals and resources, Facilitate patient progression through stages of change regarding substance use diagnoses and concerns and Identify triggers associated with mental health/substance abuse issues  Therapeutic Interventions: Assess for all discharge needs, 1 to 1 time with Social worker, Explore available resources and support systems, Assess for adequacy in community support network, Educate family and significant other(s) on suicide prevention, Complete Psychosocial Assessment, Interpersonal group therapy.  Evaluation of Outcomes: Adequate for discharge   Progress in Treatment: Attending groups: Yes Participating in groups: Yes Taking medication as prescribed: Yes. Toleration medication: Yes. Family/Significant other contact made: SPE completed with pt's mother/collateral information also obtained.  Patient understands diagnosis: Yes. Discussing patient identified problems/goals with staff: Yes. Medical problems stabilized or resolved: Yes. Denies suicidal/homicidal ideation: Yes. Pt denies SI/HI.  Issues/concerns per patient self-inventory: No. Other: n/a   New problem(s) identified: No, Describe:  n/a  New Short Term/Long Term Goal(s): medication management for mood stabilization, elimination of SI/HI thoughts, development of comprehensive mental wellness plan.   Discharge Plan or Barriers: Pt plans to go to [facility] to apply for the [facility] program at discharge. CSW asked him to call to complete phone assessment during his stay-pt left messages but was unable to reach anyone. CSW warned pt that he will likely not get admitted due to not being able to meet minimum requirements of program due to medical issues. (walking up flight of stairs;  manual labor, etc). Pt still would like to discharge with his daughter who will take him to [facility] for the assessment. CSW provided pt with additional resources including [facility] program in [place], [place] if he is not accepted into [facility], [facility] pamphlet, AA/NA lists, and [facility] appt. Pt provided with shelter lists, [facility] lists, and boarding house/extended stay hotel lists as well.   Reason for Continuation of Hospitalization: none  Estimated Length of Stay: Friday, 10/09/17  Attendees: Patient: 10/09/2017 9:46 AM  Physician: Dr. Jackquline Berlin MD; Dr. Altamese Byers MD 10/09/2017 9:46 AM  Nursing: Linus Mako RN 10/09/2017 9:46 AM  RN Care Manager: Onnie Boer CM 10/09/2017 9:46 AM  Social Worker: Trula Slade, LCSW 10/09/2017 9:46 AM  Recreational Therapist: x 10/09/2017 9:46 AM  Other: Armandina Stammer NP; Feliz Beam Money NP 10/09/2017 9:46 AM  Other:  10/09/2017 9:46 AM  Other: 10/09/2017 9:46 AM    Scribe for Treatment Team: Ledell Peoples Smart, LCSW 10/09/2017 9:46 AM
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
## 2017-10-09 NOTE — Progress Notes (Signed)
Pt plans to go to [facility] to apply for the [facility] program at discharge. CSW asked him to call to complete phone assessment during his stay-pt left messages but was unable to reach anyone. CSW warned pt that he will likely not get admitted due to not being able to meet minimum requirements of program due to medical issues. (walking up flight of stairs; manual labor, etc). Pt still would like to discharge with his daughter who will take him to [facility] for the assessment. CSW provided pt with additional resources including [facility] program in [place], [place] if he is not accepted into [facility], [facility] pamphlet, AA/NA lists, and [facility] appt. Pt provided with shelter lists, [facility] lists, and boarding house/extended stay hotel lists as well.   Trula Slade, MSW, LCSW Clinical Social Worker 10/09/2017 9:50 AM

## 2017-10-09 NOTE — [facility] Suicide Risk Assessment (Signed)
[facility] Discharge Suicide Risk Assessment   Principal Problem: Severe major depression, single episode, with psychotic features [facility] Discharge Diagnoses:  Patient Active Problem List   Diagnosis Date Noted  . 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 Weakness [R53.1]   . Cellulitis of right leg [L03.115] 05/07/2017  . Right carpal tunnel syndrome [G56.01] 02/16/2017  . Gait disturbance [R26.9] 01/22/2017  . Insomnia [G47.00] 01/22/2017  . Depression with anxiety [F41.8] 01/22/2017  . High risk medication use [Z79.899] 01/22/2017  . Major depressive disorder, recurrent episode (HCC) [F33.9] 12/19/2016  . Suicidal ideation [R45.851]   . AKI (acute kidney injury) (HCC) [N17.9] 11/15/2016  . Chronic hepatitis C virus infection (HCC) [B18.2] 11/15/2016  . Chronic abdominal pain [R10.9, G89.29] 11/15/2016  . Chronic pain syndrome [G89.4] 11/15/2016  . Acute retention of urine [R33.8] 11/15/2016  . Generalized abdominal pain [R10.84]   . Chronic cluster headache, not intractable [G44.029]   . Community acquired pneumonia [J18.9]   . 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 HCAP (healthcare-associated pneumonia) [J18.9] 02/16/2016  . Hypokalemia [E87.6] 02/16/2016  . Intractable cluster headache syndrome [G44.001]   . DVT (deep venous thrombosis) (HCC) [I82.409]   . Depression [F32.9]   . GERD (gastroesophageal reflux disease) [K21.9]   . HTN (hypertension) [I10]   . Essential hypertension [I10]   . Gastroesophageal reflux disease without esophagitis [K21.9]   . CAP (community acquired pneumonia) [J18.9]   . Multiple sclerosis (HCC) [G35] 08/02/2013  . ABDOMINAL BLOATING [R14.3, R14.1, R14.2] 10/14/2010  . LOOSE STOOLS [R19.7] 10/14/2010  . PULMONARY EMBOLISM, HX OF [Z86.718] 10/14/2010    Total Time spent with patient: 30 minutes  Musculoskeletal: Strength & Muscle Tone: within normal limits Gait & Station: uses wheelchair Patient leans:  N/A  Psychiatric Specialty Exam: Review of Systems  Constitutional: Negative for chills and fever.  Respiratory: Negative for cough.   Cardiovascular: Negative for chest pain.  Gastrointestinal: Negative for heartburn and nausea.  Psychiatric/Behavioral: Negative for depression, hallucinations and suicidal ideas.    Blood pressure 118/74, pulse 85, temperature (!) 97.5 F (36.4 C), temperature source Oral, resp. rate 18, height 5' 9.02" (1.753 m), weight 83.5 kg (184 lb), SpO2 100 %.Body mass index is 27.16 kg/m.  General Appearance: Casual and Fairly Groomed  Eye Contact::  Good  Speech:  Clear and Coherent and Normal Rate  Volume:  Normal  Mood:  Anxious and Euthymic  Affect:  Congruent  Thought Process:  Coherent and Goal Directed  Orientation:  Full (Time, Place, and Person)  Thought Content:  Logical  Suicidal Thoughts:  No  Homicidal Thoughts:  No  Memory:  Immediate;   Good Recent;   Good Remote;   Good  Judgement:  Fair  Insight:  Fair  Psychomotor Activity:  Normal  Concentration:  Good  Recall:  Good  Fund of Knowledge:Good  Language: Good  Akathisia:  No  Handed:    AIMS (if indicated):     Assets:  Communication Skills Desire for Improvement Leisure Time Physical Health Resilience Social Support  Sleep:  Number of Hours: 6  Cognition: WNL  ADL's:  Intact   Mental Status Per Nursing Assessment::   On Admission:     Demographic Factors:  Male, Low socioeconomic status, Living alone and Unemployed  Loss Factors: Decrease in vocational status and Financial problems/change in socioeconomic status  Historical Factors: Prior suicide attempts, Family history of mental illness or substance abuse and Impulsivity  Risk Reduction Factors:  Positive social support, Positive therapeutic relationship and Positive coping skills or problem solving skills  Continued Clinical Symptoms:  Depression:   Comorbid alcohol abuse/dependence  Cognitive Features That  Contribute To Risk:  None    Suicide Risk:  Minimal: No identifiable suicidal ideation.  Patients presenting with no risk factors but with morbid ruminations; may be classified as minimal risk based on the severity of the depressive symptoms  Follow-up Information    [facility] Follow up on 10/12/2017.   Specialty:  Behavioral Health Why:  Hospital follow-up/assessment with Tim for mental health services on this date at 9:15AM. Please bring: Photo ID, insurance card, and list of current medications (hospital discharge paperwork). Thank you.  Contact information: [address] [place] [address] [phone]        [facility].   Why:  Patient is choosing to apply for possible admission at the [facility] at discharge. (Patient left message for admissions during hospital stay to complete interview but had not heard back by discharge).  Contact information: [address] [place], [place] [address] Phone: [phone] Fax: [phone]        Subjective Data: Gary DaviesJonas Perez is a 56 y/o M with hx of MDD of psychotic features who was admitted with worsening depression, SI, and HI. Pt had also reported CAH to kill his nephew. Pt was restarted on medications and stabilized on the inpatient unit. Today upon evaluation, pt reports he is doing much better. He denies paranoia, SI/HI/AH/VH. He is sleeping well and appetite is good. He notes that he feels slightly fatigued in the morning and daytime and we discussed reducing dose of gabapentin and changing to TID dosing as he had previously been using at home. We also discussed moving topamax to once daily dosing at bedtime, and pt was in agreement. Pt has insight that he should avoid staying at his mother's home where his nephew is staying as to avoid any conflict with him, and his plan for immediately after the hospital is [place] person  for the [facility]. Pt was able to engage in safety planning including [facility] or contact emergency services if he feels unable to maintain his own safety or the safety of others. Pt had no further questions, comments, or concerns.   Plan Of Care/Follow-up recommendations:  -discharge to outpatient level of care - Major depression. -Continue Sertraline 50 mg pr daily. -Continue Abilify10mg  qhs -Change topamax 50mg  BID to topamax 100mg  qhs -Continue Trileptal 300 mg tid po for mood stabilization. -Continue Pamelor 50 mg daily for depression/insomnia.    - Change gabapentin 600mg  QID to gabapentin 400mg  TID  -Medical issues/pain. -Continue all other pertinenthomemedication regimen as recommended.  Activity:  as tolerated Diet:  normal Tests:  NA Other:  see above for DC plan  Micheal Likenshristopher T Billye Nydam, MD 10/09/2017, 10:31 AM

## 2017-10-28 IMAGING — MR MR HEAD WO/W CM
11 of 13 series · 36 of 48 positions shown · IV contrast (multihance)
Comparison: MRI brain 10/04/2014.

CLINICAL DATA: Multiple sclerosis. Evaluation for medication
change.

EXAM:
MRI HEAD WITHOUT AND WITH CONTRAST
TECHNIQUE: Multiplanar, multiecho pulse sequences of the brain and surrounding
structures were obtained without and with intravenous contrast.
CONTRAST:  5mL MULTIHANCE GADOBENATE DIMEGLUMINE 529 MG/ML IV SOLN

[Series 2: T1 · sagittal · 5.0mm · 0.45mm/px · 2 of 21 slices shown (1 of 2)]
[im 1/21]
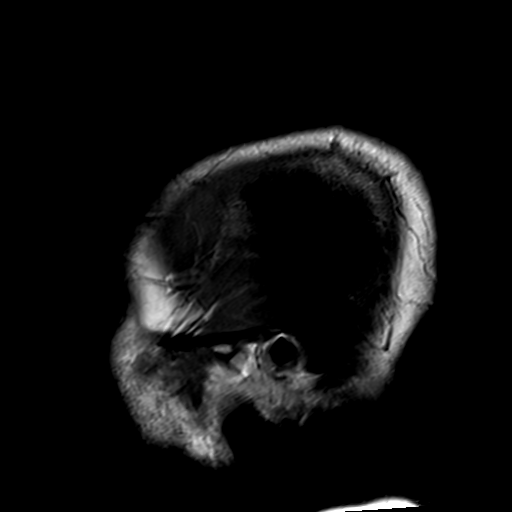
[im 21/21]
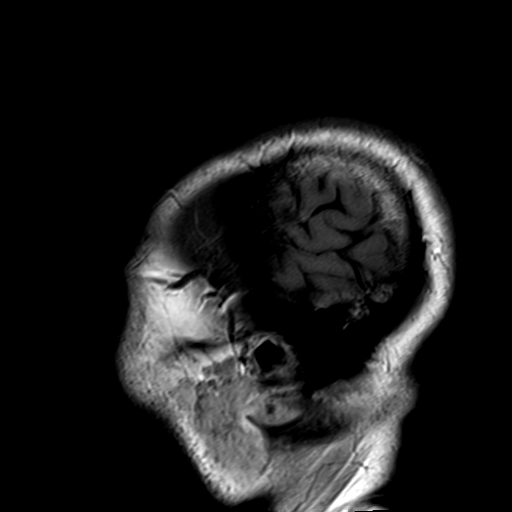

[Series 3: FLAIR · sagittal · 5.0mm · 0.45mm/px · 2 of 25 slices shown (1 of 2)]
[im 1/25]
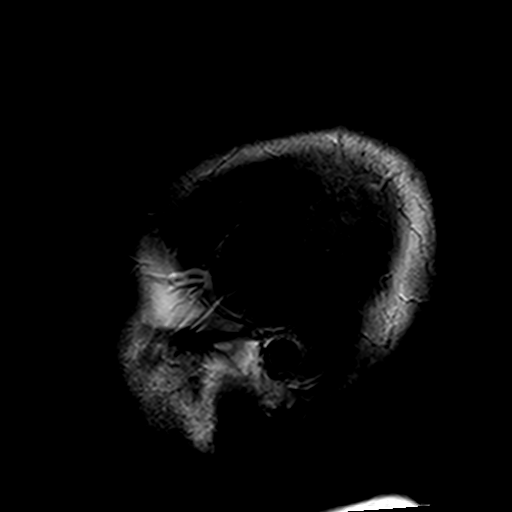
[im 25/25]
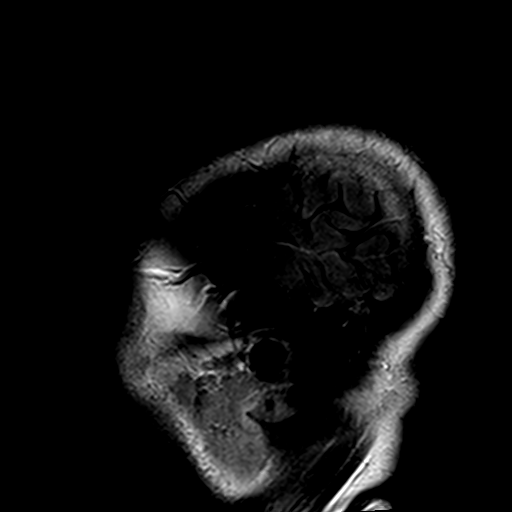

[Series 4: ax ep2d_diff_(id)_trace · axial · 3.0mm · 1.80mm/px · z∈[-77,+70]mm · 8 of 97 slices shown]
[im 1/97]
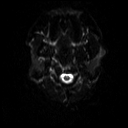
[im 14/97]
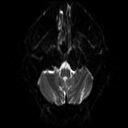
[im 28/97]
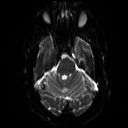
[im 42/97]
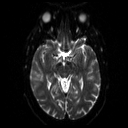
[im 55/97]
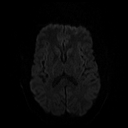
[im 69/97]
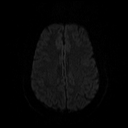
[im 83/97]
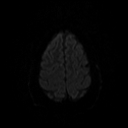
[im 97/97]
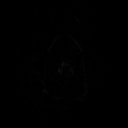

[Series 5: ax ep2d_diff_(id)_trace_adc · axial · 3.0mm · 1.80mm/px · z∈[-77,+70]mm · 4 of 48 slices shown]
[im 1/48]
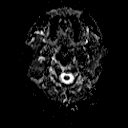
[im 16/48]
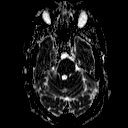
[im 32/48]
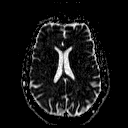
[im 48/48]
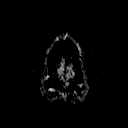

[Series 6: cor ep2d_diff · coronal · 5.0mm · 1.77mm/px · 4 of 48 slices shown]
[im 1/48]
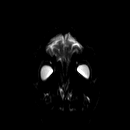
[im 16/48]
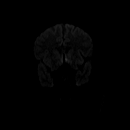
[im 32/48]
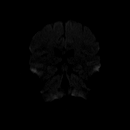
[im 48/48]
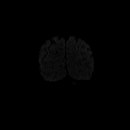

[Series 7: cor ep2d_diff_adc · coronal · 5.0mm · 1.77mm/px · 2 of 24 slices shown]
[im 1/24]
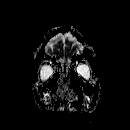
[im 24/24]
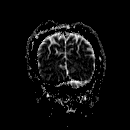

[Series 9: swi_images · axial · 2.0mm · 0.90mm/px · z∈[-81,+76]mm · 6 of 80 slices shown]
[im 1/80]
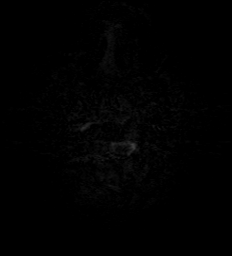
[im 16/80]
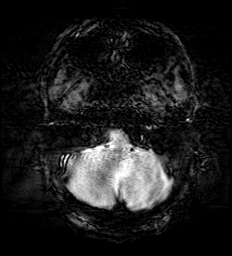
[im 32/80]
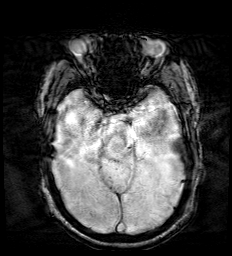
[im 48/80]
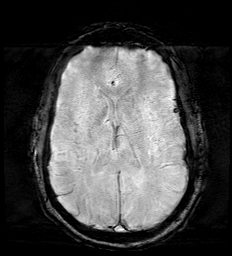
[im 64/80]
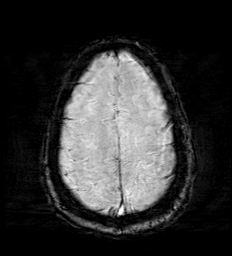
[im 80/80]
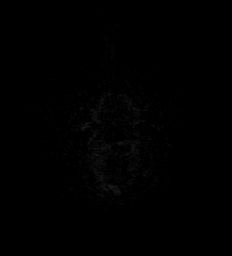

[Series 11: T2 · axial · 5.0mm · 0.60mm/px · z∈[-70,+67]mm · 2 of 23 slices shown (1 of 2)]
[im 1/23]
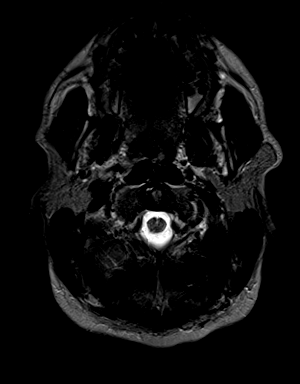
[im 23/23]
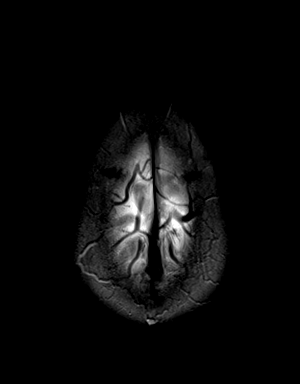

[Series 13: T2 · coronal · 5.0mm · 0.45mm/px · 2 of 25 slices shown (2 of 2)]
[im 1/25]
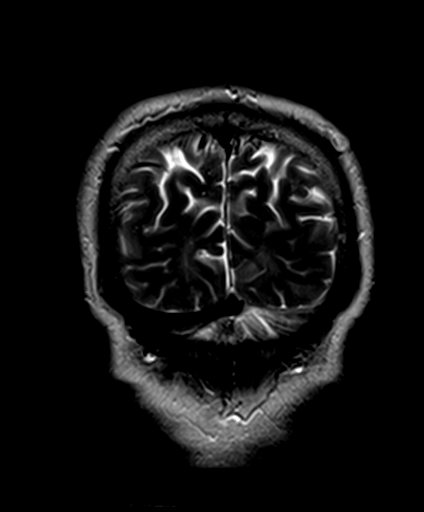
[im 25/25]
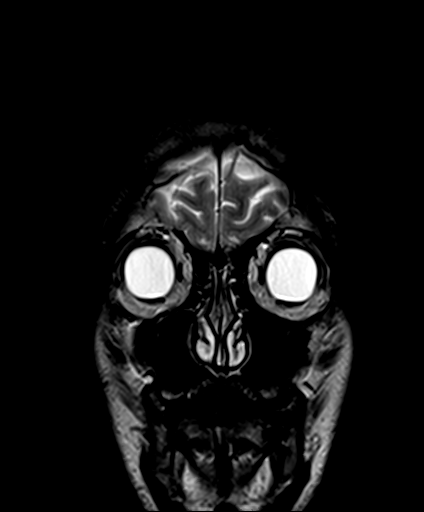

[Series 14: T1 · coronal · 5.0mm · 0.72mm/px · 2 of 25 slices shown (2 of 2)]
[im 1/25]
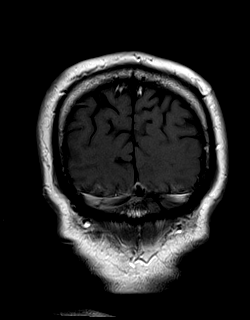
[im 25/25]
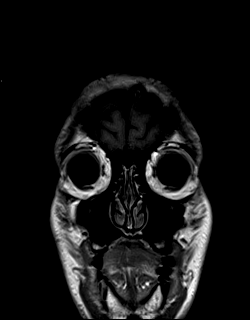

[Series 16: FLAIR · axial · 5.0mm · 0.45mm/px · z∈[-72,+65]mm · 2 of 23 slices shown (2 of 2)]
[im 1/23]
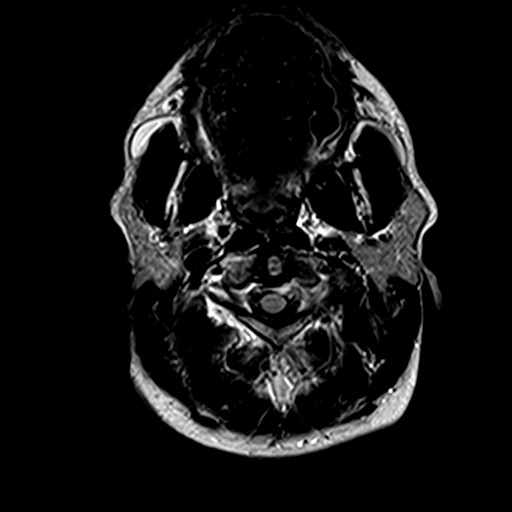
[im 23/23]
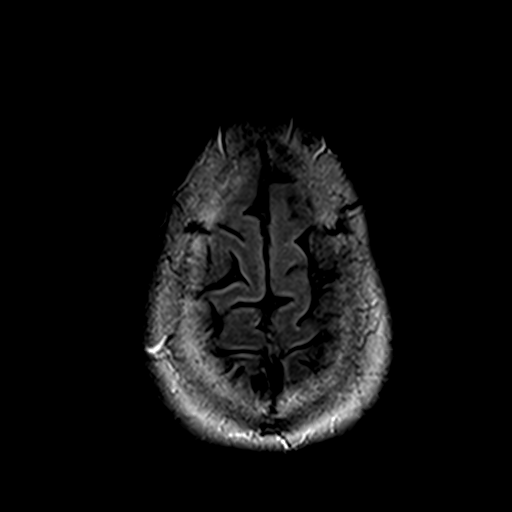

[36 of 48 positions shown; findings below may reference images not displayed]

FINDINGS: The patient had a difficult venous access, multiple punctures were
performed, but it was difficult to get good consistent return of
blood, according to the technologist. It was estimated that
approximately 5 mL of MultiHance was actually intravascular. The
technologist did report some slight extravasation of contrast, but
no significant subcutaneous fluid collection was noted.

No evidence for acute infarction, hemorrhage, mass lesion,
hydrocephalus, or extra-axial fluid. No restricted diffusion is
observed associated with the white matter lesions described below.
Normal for age cerebral volume.

Subcentimeter foci of T2 and FLAIR hyperintensity are seen in the
subcortical and periventricular white matter, which appear
predominantly supratentorial although at least one RIGHT cerebellar
white matter lesion is observed. There is a paucity of direct
involvement of the corpus callosum although periventricular lesions
are probably radially arranged. No definite progression from 6570.

Post infusion, no abnormal enhancement of the brain or meninges. No
white matter lesions are seen to enhance.

Flow voids are maintained. Negative orbits and sinuses. No mastoid
fluid.
IMPRESSION: Stable chronic scattered cerebral white matter lesions consistent
with the stated diagnosis of multiple sclerosis. No new lesions
since 6570. No evidence for restricted diffusion or postcontrast
enhancement to suggest acuity.

## 2017-11-06 ENCOUNTER — Ambulatory Visit: Payer: Self-pay

## 2017-11-20 ENCOUNTER — Inpatient Hospital Stay: Payer: 59 | Attending: Oncology

## 2017-11-20 DIAGNOSIS — Z452 Encounter for adjustment and management of vascular access device: Secondary | ICD-10-CM | POA: Insufficient documentation

## 2017-11-20 DIAGNOSIS — G35 Multiple sclerosis: Secondary | ICD-10-CM | POA: Insufficient documentation

## 2017-11-20 DIAGNOSIS — Z95828 Presence of other vascular implants and grafts: Secondary | ICD-10-CM

## 2017-11-20 MED ORDER — SODIUM CHLORIDE 0.9% FLUSH
10.0000 mL | INTRAVENOUS | Status: DC | PRN
  Administered 2017-11-20: 10 mL via INTRAVENOUS
  Filled 2017-11-20: qty 10

## 2017-11-20 MED ORDER — HEPARIN SOD (PORK) LOCK FLUSH 100 UNIT/ML IV SOLN
500.0000 [IU] | Freq: Once | INTRAVENOUS | Status: AC
  Administered 2017-11-20: 500 [IU] via INTRAVENOUS

## 2017-12-03 ENCOUNTER — Ambulatory Visit: Payer: 59

## 2017-12-04 ENCOUNTER — Ambulatory Visit: Payer: Self-pay

## 2017-12-19 ENCOUNTER — Emergency Department (HOSPITAL_COMMUNITY): Payer: 59

## 2017-12-19 ENCOUNTER — Other Ambulatory Visit: Payer: Self-pay

## 2017-12-19 ENCOUNTER — Emergency Department (HOSPITAL_COMMUNITY)
Admission: EM | Admit: 2017-12-19 | Discharge: 2017-12-22 | Disposition: A | Discharge status: Home / Self Care | Payer: 59 | Attending: Emergency Medicine | Admitting: Emergency Medicine

## 2017-12-19 DIAGNOSIS — I10 Essential (primary) hypertension: Secondary | ICD-10-CM | POA: Diagnosis not present

## 2017-12-19 DIAGNOSIS — R531 Weakness: Secondary | ICD-10-CM | POA: Insufficient documentation

## 2017-12-19 DIAGNOSIS — F333 Major depressive disorder, recurrent, severe with psychotic symptoms: Secondary | ICD-10-CM | POA: Insufficient documentation

## 2017-12-19 DIAGNOSIS — G35 Multiple sclerosis: Secondary | ICD-10-CM | POA: Diagnosis not present

## 2017-12-19 DIAGNOSIS — Z79899 Other long term (current) drug therapy: Secondary | ICD-10-CM | POA: Diagnosis not present

## 2017-12-19 DIAGNOSIS — F17210 Nicotine dependence, cigarettes, uncomplicated: Secondary | ICD-10-CM | POA: Insufficient documentation

## 2017-12-19 LAB — CBC WITH DIFFERENTIAL/PLATELET
BASOS PCT: 0 %
Basophils Absolute: 0 10*3/uL (ref 0.0–0.1)
EOS ABS: 0 10*3/uL (ref 0.0–0.7)
Eosinophils Relative: 0 %
HCT: 41.8 % (ref 39.0–52.0)
HEMOGLOBIN: 13.5 g/dL (ref 13.0–17.0)
Lymphocytes Relative: 3 %
Lymphs Abs: 0.5 10*3/uL — ABNORMAL LOW (ref 0.7–4.0)
MCH: 27.3 pg (ref 26.0–34.0)
MCHC: 32.3 g/dL (ref 30.0–36.0)
MCV: 84.4 fL (ref 78.0–100.0)
MONOS PCT: 2 %
Monocytes Absolute: 0.3 10*3/uL (ref 0.1–1.0)
NEUTROS PCT: 95 %
Neutro Abs: 15 10*3/uL — ABNORMAL HIGH (ref 1.7–7.7)
Platelets: 174 10*3/uL (ref 150–400)
RBC: 4.95 MIL/uL (ref 4.22–5.81)
RDW: 18.8 % — ABNORMAL HIGH (ref 11.5–15.5)
WBC: 15.9 10*3/uL — AB (ref 4.0–10.5)

## 2017-12-19 LAB — COMPREHENSIVE METABOLIC PANEL
ALK PHOS: 90 U/L (ref 38–126)
ALT: 89 U/L — ABNORMAL HIGH (ref 17–63)
ANION GAP: 9 (ref 5–15)
AST: 27 U/L (ref 15–41)
Albumin: 3.2 g/dL — ABNORMAL LOW (ref 3.5–5.0)
BUN: 15 mg/dL (ref 6–20)
CALCIUM: 8.4 mg/dL — AB (ref 8.9–10.3)
CO2: 20 mmol/L — ABNORMAL LOW (ref 22–32)
Chloride: 109 mmol/L (ref 101–111)
Creatinine, Ser: 0.87 mg/dL (ref 0.61–1.24)
GFR calc Af Amer: >60 mL/min (ref >60)
GFR calc non Af Amer: >60 mL/min (ref >60)
Glucose, Bld: 152 mg/dL — ABNORMAL HIGH (ref 65–99)
Potassium: 4.6 mmol/L (ref 3.5–5.1)
Sodium: 138 mmol/L (ref 135–145)
Total Bilirubin: 0.6 mg/dL (ref 0.3–1.2)
Total Protein: 5.8 g/dL — ABNORMAL LOW (ref 6.5–8.1)

## 2017-12-19 MED ORDER — HYDROMORPHONE HCL 1 MG/ML IJ SOLN
1.0000 mg | Freq: Once | INTRAMUSCULAR | Status: AC
  Administered 2017-12-19: 1 mg via INTRAVENOUS
  Filled 2017-12-19: qty 1

## 2017-12-19 MED ORDER — ONDANSETRON HCL 4 MG/2ML IJ SOLN
4.0000 mg | Freq: Once | INTRAMUSCULAR | Status: AC
  Administered 2017-12-19: 4 mg via INTRAVENOUS
  Filled 2017-12-19: qty 2

## 2017-12-19 NOTE — ED Triage Notes (Signed)
Pt presents to the ed for a MS flare up x 3 weeks states that he went to his doctor who told him that if he wasn't feeling better with the oral steroids they gave him then he needed to come to the ED. Complaining of pain in his arms, legs and a headache. Alert and oriented. No neuro deficits present

## 2017-12-19 NOTE — ED Provider Notes (Signed)
[facility] EMERGENCY DEPARTMENT Provider Note   CSN: [id number] Arrival date & time: 12/19/17  1839     History   Chief Complaint Chief Complaint  Patient presents with  . MS flare up, arm and leg pain    HPI [name] is a 57 y.o. male.  Patient with history of multiple sclerosis followed by a neurologist in [place], chronic pain on Percocet presents to the emergency department --presents tonight with complaint of worsening weakness in his arms and legs with associated tingling and pain.  Also c/o HA. Patient states that he takes Percocet at home for pain however this is no longer helping.  Typically he gets around with a walker and wheelchair however has been able to ambulate with a walker for the past week or so.  Patient states that he was started on a steroid taper about a week and a half ago.  His symptoms are not improving.  He states that he was told to go to the emergency department for IV steroids if his symptoms continued to progress.  Patient denies any fevers, chest pain, nausea, vomiting, or diarrhea.  No urinary symptoms or skin rashes.  No other symptoms of infection reported.  Patient denies signs of stroke including: facial droop, slurred speech, aphasia, unilateral weakness/numbness in extremities, imbalance. He states that he is eating and drinking well.  Symptoms currently are typical for flare of MS.  States that he is due for his next MS injection treatment in March. The onset of this condition was acute. The course is worsening. Aggravating factors: none. Alleviating factors: none.        Past Medical History:  Diagnosis Date  . Abdominal pain, unspecified site   . Anxiety   . Arthritis   . Benign paroxysmal positional vertigo   . Cellulitis and abscess of right leg 04/2017  . Chronic back pain   . Chronic pain syndrome 01/25/2008  . Cluster headache   . Depression    takes Zoloft daily  . DVT (deep venous thrombosis) (HCC)    in the left arm '09  . Gait abnormality    "uses mobile wheelchair, but is ambulatory"  . Gallstones 02/17/2009   resolved after gallbladder surgery.  [name] GERD (gastroesophageal reflux disease)    takes Omeprazole as needed  . Headache(784.0)    cluster headaches frequently-takes Topamax daily  . History of colonoscopy   . HTN (hypertension)    takes Lisinopril,Verapamil,and Triamterene HCTZ daily  . Insomnia 11/06/2008  . Joint pain   . Joint swelling    03-07-16 "swelling of right wrist" "after a fall-xray done 03-06-16 "no fractures".  . Memory loss    no an issue at present 03-07-16  . Multiple sclerosis (HCC)    Dx. 2005 - Dr. Tinnie Gens follows LOV 4'17 tx. Tysabri monthly IV-[facility] , [place].  [name] Nonspecific elevation of levels of transaminase or lactic acid dehydrogenase (LDH)   . Other specified visual disturbances   . Other syndromes affecting cervical region   . Pneumonia 2009  . Trigeminal neuralgia     history" Multiple sclerosis"    Patient Active Problem List   Diagnosis Date Noted  . Severe major depression, single episode, with psychotic features (HCC) 10/04/2017  . Weakness   . Cellulitis of right leg 05/07/2017  . Right carpal tunnel syndrome 02/16/2017  . Gait disturbance 01/22/2017  . Insomnia 01/22/2017  . Depression with anxiety 01/22/2017  . High risk medication use 01/22/2017  .  Major depressive disorder, recurrent episode (HCC) 12/19/2016  . Suicidal ideation   . AKI (acute kidney injury) (HCC) 11/15/2016  . Chronic hepatitis C virus infection (HCC) 11/15/2016  . Chronic abdominal pain 11/15/2016  . Chronic pain syndrome 11/15/2016  . Acute retention of urine 11/15/2016  . Generalized abdominal pain   . Chronic cluster headache, not intractable   . Community acquired pneumonia   . TB lung, latent   . HCAP (healthcare-associated pneumonia) 02/16/2016  . Hypokalemia 02/16/2016  . Intractable cluster headache syndrome   . DVT (deep  venous thrombosis) (HCC)   . Depression   . GERD (gastroesophageal reflux disease)   . HTN (hypertension)   . Essential hypertension   . Gastroesophageal reflux disease without esophagitis   . CAP (community acquired pneumonia)   . Multiple sclerosis (HCC) 08/02/2013  . ABDOMINAL BLOATING 10/14/2010  . LOOSE STOOLS 10/14/2010  . PULMONARY EMBOLISM, HX OF 10/14/2010    Past Surgical History:  Procedure Laterality Date  . CHOLECYSTECTOMY  02/20/2009  . COLONOSCOPY WITH PROPOFOL N/A 03/17/2016   Procedure: COLONOSCOPY WITH PROPOFOL;  Perez: [name], MD;  Location: [facility] ENDOSCOPY;  Service: Endoscopy;  Laterality: N/A;  . PORT A CATH REVISION N/A 07/06/2015   Procedure: Removal and replacement of PORT A CATH;  Perez: [name], MD;  Location: [facility] OR;  Service: General;  Laterality: N/A;  . PORTACATH PLACEMENT N/A 03/27/2014   Procedure: INSERTION PORT-A-CATH;  Perez: [name], MD;  Location: [facility] OR;  Service: General;  Laterality: N/A;       Home Medications    Prior to Admission medications   Medication Sig Start Date End Date Taking? Authorizing Provider  baclofen (LIORESAL) 20 MG tablet Take 1 tablet (20 mg total) by mouth 4 (four) times daily. For muscle spasms Patient taking differently: Take 20 mg by mouth 3 (three) times daily. 10a, 2pm, 10p - For muscle spasms 10/09/17  Yes Nwoko, Nicole Kindred I, NP  dalfampridine 10 MG TB12 Take 1 tablet (10 mg total) by mouth 2 (two) times daily. For MS Patient taking differently: Take 10 mg by mouth 2 (two) times daily. "Ampyra" 10a and 10p -For MS 10/09/17  Yes Nwoko, Nicole Kindred I, NP  gabapentin (NEURONTIN) 600 MG tablet Take 600 mg by mouth 4 (four) times daily. 10am, 2pm, 6pm, 10pm 10/25/17  Yes [provider]  Oxcarbazepine (TRILEPTAL) 300 MG tablet Take 1 tablet (300 mg total) by mouth 3 (three) times daily. For mood stabilization 10/09/17  Yes Nwoko, Nicole Kindred I, NP  sertraline (ZOLOFT) 50 MG tablet Take 1 tablet (50 mg  total) by mouth daily. For depression 10/10/17  Yes Nwoko, Nicole Kindred I, NP  verapamil (CALAN) 80 MG tablet Take 1 tablet (80 mg total) by mouth 2 (two) times daily. For high blood pressure Patient taking differently: Take 80 mg by mouth See admin instructions. Take one tablet (80 mg) by mouth twice daily - 2pm and 10pm. For high blood pressure 10/09/17  Yes Nwoko, Agnes I, NP  ARIPiprazole (ABILIFY) 10 MG tablet Take 1 tablet (10 mg total) by mouth at bedtime. For mood control 10/09/17   Armandina Stammer I, NP  dicyclomine (BENTYL) 20 MG tablet Take 1 tablet (20 mg total) by mouth every 6 (six) hours as needed for spasms. 10/09/17   Armandina Stammer I, NP  gabapentin (NEURONTIN) 800 MG tablet Take 0.5 tablets (400 mg total) by mouth 3 (three) times daily. For agitation/pain 10/09/17   Armandina Stammer I, NP  hydrOXYzine (ATARAX/VISTARIL) 25  MG tablet Take 1 tablet (25 mg total) by mouth every 6 (six) hours as needed for anxiety. 10/09/17   Armandina Stammer I, NP  lisinopril-hydrochlorothiazide (PRINZIDE,ZESTORETIC) 10-12.5 MG tablet Take 1 tablet by mouth daily.    [provider]  nortriptyline (PAMELOR) 50 MG capsule Take 1 capsule (50 mg total) by mouth at bedtime. For depression/sleep 10/09/17   Armandina Stammer I, NP  omeprazole (PRILOSEC) 40 MG capsule Take 1 capsule (40 mg total) by mouth every evening. For acid reflux 10/09/17   Armandina Stammer I, NP  oxyCODONE-acetaminophen (PERCOCET) 10-325 MG tablet Take 1 tablet by mouth every 4 (four) hours as needed for pain. 10/09/17   Armandina Stammer I, NP  potassium chloride SA (KLOR-CON M20) 20 MEQ tablet Take 1 tablet (20 mEq total) by mouth daily. For potassium replacement 10/10/17   Armandina Stammer I, NP  topiramate (TOPAMAX) 100 MG tablet Take 1 tablet (100 mg total) by mouth at bedtime. For mood stabilization 10/09/17   Armandina Stammer I, NP  triamterene-hydrochlorothiazide (DYAZIDE) 37.5-25 MG capsule Take 1 each (1 capsule total) by mouth daily. For high blood pressure  10/09/17   Nwoko, Agnes I, NP  XARELTO 10 MG TABS tablet Take 1 tablet (10 mg total) by mouth daily. For blood clot prevention 10/09/17   Sanjuana Kava, NP    Family History Family History  Problem Relation Age of Onset  . Cancer Father   . Diabetes Mother     Social History Social History   Tobacco Use  . Smoking status: Former Smoker    Packs/day: 1.00    Years: 38.00    Pack years: 38.00    Types: Cigarettes  . Smokeless tobacco: Never Used  . Tobacco comment: cutting back  Substance Use Topics  . Alcohol use: Yes    Alcohol/week: 0.0 oz    Comment: occasional  . Drug use: No    Comment: Quit 2011     Allergies   Patient has no known allergies.   Review of Systems Review of Systems  Constitutional: Negative for fever.  HENT: Negative for congestion, dental problem, rhinorrhea and sinus pressure.   Eyes: Negative for photophobia, discharge, redness and visual disturbance.  Respiratory: Negative for shortness of breath.   Cardiovascular: Negative for chest pain.  Gastrointestinal: Negative for nausea and vomiting.  Musculoskeletal: Positive for gait problem and myalgias. Negative for neck pain and neck stiffness.  Skin: Negative for rash.  Neurological: Positive for weakness, numbness and headaches. Negative for syncope, speech difficulty and light-headedness.  Psychiatric/Behavioral: Negative for confusion.     Physical Exam Updated Vital Signs BP 123/76 (BP Location: Right Arm)   Pulse 74   Temp 98.1 F (36.7 C) (Oral)   Resp 16   Wt 83.5 kg (184 lb)   SpO2 97%   BMI 27.16 kg/m   Physical Exam  Constitutional: He is oriented to person, place, and time. He appears well-developed and well-nourished.  HENT:  Head: Normocephalic and atraumatic.  Right Ear: Tympanic membrane, external ear and ear canal normal.  Left Ear: Tympanic membrane, external ear and ear canal normal.  Nose: Nose normal.  Mouth/Throat: Uvula is midline, oropharynx is clear and  moist and mucous membranes are normal.  Eyes: Conjunctivae, EOM and lids are normal. Pupils are equal, round, and reactive to light. Right eye exhibits no discharge. Left eye exhibits no discharge.  Neck: Normal range of motion. Neck supple.  Cardiovascular: Normal rate, regular rhythm and normal heart sounds.  Pulmonary/Chest:  Effort normal and breath sounds normal.  Abdominal: Soft. There is no tenderness.  Musculoskeletal: Normal range of motion.       Cervical back: He exhibits normal range of motion, no tenderness and no bony tenderness.  Neurological: He is alert and oriented to person, place, and time. He has normal reflexes. A sensory deficit is present. No cranial nerve deficit. He exhibits normal muscle tone. Coordination (normal UE) normal. GCS eye subscore is 4. GCS verbal subscore is 5. GCS motor subscore is 6.  Extremity paresthesias reported bilaterally.  Unable to lift and hold legs off bed. Symmetric grip strength. Generalized weakness in upper and lower extremities.   Skin: Skin is warm and dry.  Psychiatric: He has a normal mood and affect.  Nursing note and vitals reviewed.    ED Treatments / Results  Labs (all labs ordered are listed, but only abnormal results are displayed) Labs Reviewed  CBC WITH DIFFERENTIAL/PLATELET - Abnormal; Notable for the following components:      Result Value   WBC 15.9 (*)    RDW 18.8 (*)    Neutro Abs 15.0 (*)    Lymphs Abs 0.5 (*)    All other components within normal limits  COMPREHENSIVE METABOLIC PANEL - Abnormal; Notable for the following components:   CO2 20 (*)    Glucose, Bld 152 (*)    Calcium 8.4 (*)    Total Protein 5.8 (*)    Albumin 3.2 (*)    ALT 89 (*)    All other components within normal limits  URINALYSIS, ROUTINE W REFLEX MICROSCOPIC    EKG  EKG Interpretation None       Radiology Dg Chest 2 View  Result Date: 12/19/2017 CLINICAL DATA:  Pt presents to the ed for a MS flare up x 3 weeks states that he  went to his doctor who told him that if he wasn't feeling better with the oral steroids they gave him then he needed to come to the ED. Complaining of pain in his arms, legs and a headache. EXAM: CHEST  2 VIEW COMPARISON:  10/03/2017 FINDINGS: Cardiac silhouette is normal in size. No mediastinal or hilar masses. No convincing adenopathy. Clear lungs. No pleural effusion or pneumothorax. Right anterior chest wall, internal jugular, Port-A-Cath has its tip at the caval atrial junction, unchanged. Skeletal structures are intact. IMPRESSION: No active cardiopulmonary disease. Electronically Signed   By: Amie Portland M.D.   On: 12/19/2017 23:39    Procedures Procedures (including critical care time)  Medications Ordered in ED Medications  HYDROmorphone (DILAUDID) injection 1 mg (1 mg Intravenous Given 12/19/17 2303)  ondansetron (ZOFRAN) injection 4 mg (4 mg Intravenous Given 12/19/17 2304)     Initial Impression / Assessment and Plan / ED Course  I have reviewed the triage vital signs and the nursing notes.  Pertinent labs & imaging results that were available during my care of the patient were reviewed by me and considered in my medical decision making (see chart for details).     Patient seen and examined. Work-up initiated. Medications ordered.   Vital signs reviewed and are as follows: BP 123/76 (BP Location: Right Arm)   Pulse 74   Temp 98.1 F (36.7 C) (Oral)   Resp 16   Wt 83.5 kg (184 lb)   SpO2 97%   BMI 27.16 kg/m    11:57 PM Spoke with Dr. Wilford Corner. Plan: MRI brain/c-spine with and without to eval for enhancing lesions.  [place]rowning PA-C to assume  care at shift change. Pt updated.    Final Clinical Impressions(s) / ED Diagnoses   Final diagnoses:  Weakness    ED Discharge Orders    None       Renne Crigler, PA-C 12/19/17 2359    Raeford Razor, MD 12/20/17 [phone]

## 2017-12-20 ENCOUNTER — Emergency Department (HOSPITAL_COMMUNITY): Payer: 59

## 2017-12-20 DIAGNOSIS — F333 Major depressive disorder, recurrent, severe with psychotic symptoms: Secondary | ICD-10-CM

## 2017-12-20 DIAGNOSIS — Z87891 Personal history of nicotine dependence: Secondary | ICD-10-CM | POA: Diagnosis not present

## 2017-12-20 DIAGNOSIS — R531 Weakness: Secondary | ICD-10-CM | POA: Diagnosis not present

## 2017-12-20 LAB — URINALYSIS, ROUTINE W REFLEX MICROSCOPIC
BILIRUBIN URINE: NEGATIVE
GLUCOSE, UA: NEGATIVE mg/dL
HGB URINE DIPSTICK: NEGATIVE
Ketones, ur: NEGATIVE mg/dL
Leukocytes, UA: NEGATIVE
Nitrite: NEGATIVE
PROTEIN: NEGATIVE mg/dL
Specific Gravity, Urine: 1.028 (ref 1.005–1.030)
pH: 5 (ref 5.0–8.0)

## 2017-12-20 LAB — RAPID URINE DRUG SCREEN, HOSP PERFORMED
AMPHETAMINES: NOT DETECTED
BARBITURATES: NOT DETECTED
Benzodiazepines: NOT DETECTED
COCAINE: POSITIVE — AB
Opiates: POSITIVE — AB
Tetrahydrocannabinol: POSITIVE — AB

## 2017-12-20 MED ORDER — HEPARIN SOD (PORK) LOCK FLUSH 100 UNIT/ML IV SOLN
500.0000 [IU] | Freq: Once | INTRAVENOUS | Status: AC
  Administered 2017-12-20: 500 [IU]
  Filled 2017-12-20: qty 5

## 2017-12-20 MED ORDER — GABAPENTIN 600 MG PO TABS
600.0000 mg | ORAL_TABLET | Freq: Four times a day (QID) | ORAL | Status: DC
  Administered 2017-12-20: 600 mg via ORAL
  Filled 2017-12-20 (×3): qty 1

## 2017-12-20 MED ORDER — SERTRALINE HCL 50 MG PO TABS
50.0000 mg | ORAL_TABLET | Freq: Every day | ORAL | Status: DC
  Administered 2017-12-20 – 2017-12-22 (×3): 50 mg via ORAL
  Filled 2017-12-20 (×3): qty 1

## 2017-12-20 MED ORDER — OXYCODONE-ACETAMINOPHEN 5-325 MG PO TABS
2.0000 | ORAL_TABLET | Freq: Once | ORAL | Status: AC
  Administered 2017-12-20: 2 via ORAL
  Filled 2017-12-20: qty 2

## 2017-12-20 MED ORDER — GABAPENTIN 300 MG PO CAPS
600.0000 mg | ORAL_CAPSULE | Freq: Four times a day (QID) | ORAL | Status: DC
  Administered 2017-12-20: 600 mg via ORAL

## 2017-12-20 MED ORDER — HYDROCHLOROTHIAZIDE 12.5 MG PO CAPS
12.5000 mg | ORAL_CAPSULE | Freq: Every day | ORAL | Status: DC
  Administered 2017-12-20 – 2017-12-22 (×2): 12.5 mg via ORAL
  Filled 2017-12-20 (×3): qty 1

## 2017-12-20 MED ORDER — HYDROCHLOROTHIAZIDE 25 MG PO TABS
25.0000 mg | ORAL_TABLET | Freq: Every day | ORAL | Status: DC
  Administered 2017-12-20 – 2017-12-22 (×2): 25 mg via ORAL
  Filled 2017-12-20 (×3): qty 1

## 2017-12-20 MED ORDER — PANTOPRAZOLE SODIUM 40 MG PO TBEC
40.0000 mg | DELAYED_RELEASE_TABLET | Freq: Every day | ORAL | Status: DC
  Administered 2017-12-20 – 2017-12-22 (×3): 40 mg via ORAL
  Filled 2017-12-20 (×3): qty 1

## 2017-12-20 MED ORDER — GADOBENATE DIMEGLUMINE 529 MG/ML IV SOLN
16.0000 mL | Freq: Once | INTRAVENOUS | Status: AC | PRN
  Administered 2017-12-20: 16 mL via INTRAVENOUS

## 2017-12-20 MED ORDER — GABAPENTIN 300 MG PO CAPS
600.0000 mg | ORAL_CAPSULE | Freq: Four times a day (QID) | ORAL | Status: DC
  Administered 2017-12-20 – 2017-12-22 (×6): 600 mg via ORAL
  Filled 2017-12-20 (×6): qty 2

## 2017-12-20 MED ORDER — LEVETIRACETAM 500 MG PO TABS
500.0000 mg | ORAL_TABLET | Freq: Two times a day (BID) | ORAL | Status: DC
  Administered 2017-12-20 – 2017-12-22 (×5): 500 mg via ORAL
  Filled 2017-12-20 (×5): qty 1

## 2017-12-20 MED ORDER — OXCARBAZEPINE 300 MG PO TABS
300.0000 mg | ORAL_TABLET | Freq: Three times a day (TID) | ORAL | Status: DC
  Administered 2017-12-20 – 2017-12-22 (×7): 300 mg via ORAL
  Filled 2017-12-20 (×7): qty 1

## 2017-12-20 MED ORDER — TOPIRAMATE 25 MG PO TABS
25.0000 mg | ORAL_TABLET | Freq: Every evening | ORAL | Status: DC
  Administered 2017-12-20 – 2017-12-21 (×2): 25 mg via ORAL
  Filled 2017-12-20 (×3): qty 1

## 2017-12-20 MED ORDER — LISINOPRIL-HYDROCHLOROTHIAZIDE 10-12.5 MG PO TABS: 1.0000 | ORAL_TABLET | Freq: Every day | ORAL | Status: DC

## 2017-12-20 MED ORDER — LISINOPRIL 10 MG PO TABS
10.0000 mg | ORAL_TABLET | Freq: Every day | ORAL | Status: DC
  Administered 2017-12-20 – 2017-12-22 (×2): 10 mg via ORAL
  Filled 2017-12-20 (×3): qty 1

## 2017-12-20 MED ORDER — HYDROMORPHONE HCL 1 MG/ML IJ SOLN
1.0000 mg | Freq: Once | INTRAMUSCULAR | Status: AC
Start: 2017-12-20 — End: 2017-12-20
  Administered 2017-12-20: 1 mg via INTRAVENOUS
  Filled 2017-12-20: qty 1

## 2017-12-20 MED ORDER — TOPIRAMATE 25 MG PO TABS
50.0000 mg | ORAL_TABLET | Freq: Two times a day (BID) | ORAL | Status: DC
  Administered 2017-12-20 – 2017-12-22 (×5): 50 mg via ORAL
  Filled 2017-12-20 (×4): qty 2

## 2017-12-20 NOTE — ED Notes (Signed)
TTS interview in progress.  

## 2017-12-20 NOTE — BH Assessment (Addendum)
Tele Assessment Note   Patient Name: [name] MRN: [id number] Referring Physician: Roxy Horseman, PA-C Location of Patient:  [facility] Location of Provider: Behavioral Health TTS Department  [name] is an 57 y.o. divorced male who presents unaccompanied to [facility] ED reports pain and weakness related to multiple sclerosis. Pt says is neurologist prescribed him oral steroids and that if his symptoms worsened to go to the ED. Pt says he needs additional steroids to address his pain and feels the neurologist at [facility] isn't helping him. When discharge plan was discussed with Pt he stated he would "go home and kill myself." During assessment Pt continues to endorse suicidal ideation with no specific plan, stating he would use "just some kind of way." Pt reports he has threatened to shoot himself in the past but denies any actual attempts. He says he has access to firearms. Pt acknowledges symptoms including crying spells, social withdrawal, loss of interest in usual pleasures, fatigue, irritability, decreased concentration, decreased sleep, decreased appetite and feelings of guilt and hopelessness. He reports current thoughts of wanting to harm his nephew, other family members and "just anybody." He does not specify any specific plan or intent. Pt reports he is experiencing auditory hallucinations which he cannot describe. Pt reports he has used drugs in the past but denies any drug use in the past three years. He says he rarely drinks alcohol.  Pt identifies his MS symptoms as his primary stressor. He describes generalized pain and says when he is stressed he has more difficulty speaking. Pt is on disability and cannot identify anyone in his life who is supportive. He lives with his mother, brother and nephew. He has been divorced several times and has four children, ages 48, 82, 37 and 38. His minor children live with their mother. Pt denies current legal problems. Pt denies any  history of abuse or trauma. Pt denies any current mental health providers. Pt was psychiatrically hospitalized at [facility] in November 2018.   Pt is dressed in a t-shirt and covered with a blanket. He is alert and oriented x4. Pt speaks in a somewhat mumbled tone, at moderate volume and and with a pronounced stutter. Motor behavior appears somewhat impaired. Eye contact is good. Pt's mood is depressed and irritable; affect is congruent with mood. Thought process is coherent and relevant. There is no indication Pt is currently responding to internal stimuli or experiencing delusional thought content. Pt was cooperative throughout assessment and appeared to be responding affirmatively to most symptoms but when asked to elaborate would give vague descriptions. He says he is willing to sign voluntarily into a psychiatric facility.    Diagnosis: Major Depressive Disorder, Recurrent, Severe With Psychotic Features  Past Medical History:  Past Medical History:  Diagnosis Date  . Abdominal pain, unspecified site   . Anxiety   . Arthritis   . Benign paroxysmal positional vertigo   . Cellulitis and abscess of right leg 04/2017  . Chronic back pain   . Chronic pain syndrome 01/25/2008  . Cluster headache   . Depression    takes Zoloft daily  . DVT (deep venous thrombosis) (HCC)    in the left arm '09  . Gait abnormality    "uses mobile wheelchair, but is ambulatory"  . Gallstones 02/17/2009   resolved after gallbladder surgery.  [name] GERD (gastroesophageal reflux disease)    takes Omeprazole as needed  . Headache(784.0)    cluster headaches frequently-takes Topamax daily  . History  of colonoscopy   . HTN (hypertension)    takes Lisinopril,Verapamil,and Triamterene HCTZ daily  . Insomnia 11/06/2008  . Joint pain   . Joint swelling    03-07-16 "swelling of right wrist" "after a fall-xray done 03-06-16 "no fractures".  . Memory loss    no an issue at present 03-07-16  . Multiple sclerosis (HCC)     Dx. 2005 - Dr. Tinnie Gens follows LOV 4'17 tx. Tysabri monthly IV-[facility] [place].  [name] Nonspecific elevation of levels of transaminase or lactic acid dehydrogenase (LDH)   . Other specified visual disturbances   . Other syndromes affecting cervical region   . Pneumonia 2009  . Trigeminal neuralgia     history" Multiple sclerosis"    Past Surgical History:  Procedure Laterality Date  . CHOLECYSTECTOMY  02/20/2009  . COLONOSCOPY WITH PROPOFOL N/A 03/17/2016   Procedure: COLONOSCOPY WITH PROPOFOL;  Surgeon: [name], MD;  Location: [facility] ENDOSCOPY;  Service: Endoscopy;  Laterality: N/A;  . PORT A CATH REVISION N/A 07/06/2015   Procedure: Removal and replacement of PORT A CATH;  Surgeon: [name], MD;  Location: [facility] OR;  Service: General;  Laterality: N/A;  . PORTACATH PLACEMENT N/A 03/27/2014   Procedure: INSERTION PORT-A-CATH;  Surgeon: [name], MD;  Location: [facility] OR;  Service: General;  Laterality: N/A;    Family History:  Family History  Problem Relation Age of Onset  . Cancer Father   . Diabetes Mother     Social History:  reports that he has quit smoking. His smoking use included cigarettes. He has a 38.00 pack-year smoking history. he has never used smokeless tobacco. He reports that he drinks alcohol. He reports that he does not use drugs.  Additional Social History:  Alcohol / Drug Use Pain Medications: See MAR Prescriptions: See MAR Over the Counter: See MAR History of alcohol / drug use?: Yes Longest period of sobriety (when/how long): Three years Negative Consequences of Use: (Pt denies) Withdrawal Symptoms: (Pt denies) Substance #1 Name of Substance 1: Marijuana 1 - Age of First Use: Adolescent 1 - Amount (size/oz): Pt unable to specify 1 - Frequency: Pt unable to specify 1 - Duration: Ongoing 1 - Last Use / Amount: 12/19/17  CIWA: CIWA-Ar BP: 128/64 Pulse Rate: 82 COWS:    Allergies: No Known Allergies  Home Medications:  (Not  in a hospital admission)  OB/GYN Status:  No LMP for male patient.  General Assessment Data Location of Assessment: [facility] ED TTS Assessment: In system Is this a Tele or Face-to-Face Assessment?: Tele Assessment Is this an Initial Assessment or a Re-assessment for this encounter?: Initial Assessment Marital status: Divorced Noank name: NA Is patient pregnant?: No Pregnancy Status: No Living Arrangements: Parent, Other relatives Can pt return to current living arrangement?: Yes Admission Status: Voluntary Is patient capable of signing voluntary admission?: Yes Referral Source: Self/Family/Friend Insurance type: [employer]     Crisis Care Plan Living Arrangements: Parent, Other relatives Legal Guardian: Other:(Self) Name of Psychiatrist: None Name of Therapist: None  Education Status Is patient currently in school?: No Current Grade: NA Highest grade of school patient has completed: Some college Name of school: NA Contact person: NA  Risk to self with the past 6 months Suicidal Ideation: Yes-Currently Present Has patient been a risk to self within the past 6 months prior to admission? : Yes Suicidal Intent: Yes-Currently Present Has patient had any suicidal intent within the past 6 months prior to admission? : Yes Is patient  at risk for suicide?: Yes Suicidal Plan?: No Has patient had any suicidal plan within the past 6 months prior to admission? : Yes Access to Means: Yes Specify Access to Suicidal Means: Pt reports he has access to firearms What has been your use of drugs/alcohol within the last 12 months?: Pt reports occasional marijuana use Previous Attempts/Gestures: No How many times?: 0 Other Self Harm Risks: None Triggers for Past Attempts: None known Intentional Self Injurious Behavior: None Family Suicide History: No Recent stressful life event(s): Other (Comment)(Chronic pain) Persecutory voices/beliefs?: No Depression: Yes Depression  Symptoms: Despondent, Insomnia, Tearfulness, Isolating, Fatigue, Guilt, Loss of interest in usual pleasures, Feeling worthless/self pity, Feeling angry/irritable Substance abuse history and/or treatment for substance abuse?: No Suicide prevention information given to non-admitted patients: Not applicable  Risk to Others within the past 6 months Homicidal Ideation: Yes-Currently Present Does patient have any lifetime risk of violence toward others beyond the six months prior to admission? : Yes (comment) Thoughts of Harm to Others: Yes-Currently Present Comment - Thoughts of Harm to Others: Pt reports thoughts of harming his nephew and other family members Current Homicidal Intent: No Current Homicidal Plan: No Access to Homicidal Means: Yes Describe Access to Homicidal Means: Pt reports access to firearms Identified Victim: Nephew and other family History of harm to others?: Yes Assessment of Violence: In distant past Violent Behavior Description: Pt reports he has been arrested for assault in the past Does patient have access to weapons?: Yes (Comment) Criminal Charges Pending?: No Does patient have a court date: No Is patient on probation?: No  Psychosis Hallucinations: Auditory(Reports vague auditory hallucinations) Delusions: None noted  Mental Status Report Appearance/Hygiene: Other (Comment)(T-shirt and blanket) Eye Contact: Poor Motor Activity: Unremarkable Speech: Other (Comment)(Stutter) Level of Consciousness: Alert Mood: Depressed, Irritable Affect: Irritable Anxiety Level: Minimal Thought Processes: Coherent, Relevant Judgement: Partial Orientation: Person, Place, Time, Situation Obsessive Compulsive Thoughts/Behaviors: None  Cognitive Functioning Concentration: Normal Memory: Recent Intact, Remote Intact IQ: Average Insight: Poor Impulse Control: Fair Appetite: Fair Weight Loss: 0 Weight Gain: 0 Sleep: Decreased Total Hours of Sleep: 4 Vegetative  Symptoms: None  ADLScreening [facility] Assessment Services) Patient's cognitive ability adequate to safely complete daily activities?: Yes Patient able to express need for assistance with ADLs?: Yes Independently performs ADLs?: Yes (appropriate for developmental age)  Prior Inpatient Therapy Prior Inpatient Therapy: Yes Prior Therapy Dates: 09/2017 Prior Therapy Facilty/Provider(s): [facility] Reason for Treatment: Depression  Prior Outpatient Therapy Prior Outpatient Therapy: No Prior Therapy Dates: NA Prior Therapy Facilty/Provider(s): NA Reason for Treatment: NA Does patient have an ACCT team?: No Does patient have Intensive In-House Services?  : No Does patient have [facility] services? : No Does patient have P4CC services?: No  ADL Screening (condition at time of admission) Patient's cognitive ability adequate to safely complete daily activities?: Yes Is the patient deaf or have difficulty hearing?: No Does the patient have difficulty seeing, even when wearing glasses/contacts?: No Does the patient have difficulty concentrating, remembering, or making decisions?: No Patient able to express need for assistance with ADLs?: Yes Does the patient have difficulty dressing or bathing?: No Independently performs ADLs?: Yes (appropriate for developmental age) Does the patient have difficulty walking or climbing stairs?: Yes Weakness of Legs: Both Weakness of Arms/Hands: Both  Home Assistive Devices/Equipment Home Assistive Devices/Equipment: Wheelchair, Environmental consultant (specify type)    Abuse/Neglect Assessment (Assessment to be complete while patient is alone) Abuse/Neglect Assessment Can Be Completed: Yes Physical Abuse: Denies Verbal Abuse: Denies Sexual Abuse: Denies Exploitation of  patient/patient's resources: Denies Self-Neglect: Denies     Merchant navy officer (For Healthcare) Does Patient Have a Medical Advance Directive?: No Would patient like information on creating a medical  advance directive?: No - Patient declined    Additional Information 1:1 In Past 12 Months?: No CIRT Risk: No Elopement Risk: No Does patient have medical clearance?: Yes     Disposition: Gave clinical report to Nira Conn, NP who recommended Pt be observed in ED for safety and evaluated by psychiatry later this morning. Notified Roxy Horseman, PA-C and Jarvis Newcomer, RN of recommendation.  Disposition Initial Assessment Completed for this Encounter: Yes Disposition of Patient: Re-evaluation by Psychiatry recommended  This service was provided via telemedicine using a 2-way, interactive audio and video technology.  Names of all persons participating in this telemedicine service and their role in this encounter. Name: [name] Role: Patient  Name: Shela Commons, [place] Role: TTS counselor         Harlin Rain Patsy [place], [facility], [facility], [facility] Triage Specialist [phone]  Pamalee Leyden 12/20/2017 6:12 AM

## 2017-12-20 NOTE — ED Notes (Signed)
UDS not ordered on pt; ordered at this time. This RN called lab and they state they will add it on to previous collection.

## 2017-12-20 NOTE — ED Notes (Signed)
Patient wearing maroon paper scrubs , sitter requested at staffing office /security notified to wand pt. , pt.'s personal belongings inventoried and stored at locker#4 PodF, valuables( wallet/keys cell phone) at security office safe . Ford from [facility] advised RN that pt. will be reevaluated by psychiatrist in the morning .

## 2017-12-20 NOTE — ED Notes (Signed)
Patient currently at [facility]

## 2017-12-20 NOTE — Progress Notes (Addendum)
LCSW following for disposition:  Observation with re-evaluation on 12/21/17.    Patient just seen at 0600am. Per NP, patient will be seen at 12/21/17.  3:17 PM NP completed re-evaluation despite note above. NP recommends inpatient psych [facility] reviewing patient.   Discussed with RN regarding medical status and ADLs.  Patient at baseline can manage independently and get around with walker or wheelchair.  He came to hospital for MS flare up and was medically cleared and then reported SI when he was being discharge. LCSW is seeking inpatient placement. Asked BH AC to review. RN aware. Patient has been referred to the following:  [facility] [place] [facility] [place]  Deretha Emory, MSW Clinical Social Work: Optician, dispensing Coverage for :  [phone]

## 2017-12-20 NOTE — Consult Note (Signed)
Telepsych Consultation   Reason for Consult: Depression with suicide ideations Referring Physician: Montine Circle, PA-C Location of Patient: [facility] Location of Provider: [facility]  Patient Identification: [name] MRN:  [id number] Principal Diagnosis: <principal problem not specified> Diagnosis:   Patient Active Problem List   Diagnosis Date Noted  . Severe major depression, single episode, with psychotic features ([place]) [F32.3] 10/04/2017  . Weakness [R53.1]   . Cellulitis of right leg [L03.115] 05/07/2017  . Right carpal tunnel syndrome [G56.01] 02/16/2017  . Gait disturbance [R26.9] 01/22/2017  . Insomnia [G47.00] 01/22/2017  . Depression with anxiety [F41.8] 01/22/2017  . High risk medication use [Z79.899] 01/22/2017  . Major depressive disorder, recurrent episode ([place]) [F33.9] 12/19/2016  . Suicidal ideation [R45.851]   . AKI (acute kidney injury) ([place]) [N17.9] 11/15/2016  . Chronic hepatitis C virus infection ([place]) [B18.2] 11/15/2016  . Chronic abdominal pain [R10.9, G89.29] 11/15/2016  . Chronic pain syndrome [G89.4] 11/15/2016  . Acute retention of urine [R33.8] 11/15/2016  . Generalized abdominal pain [R10.84]   . Chronic cluster headache, not intractable [G44.029]   . Community acquired pneumonia [J18.9]   . 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 HCAP (healthcare-associated pneumonia) [J18.9] 02/16/2016  . Hypokalemia [E87.6] 02/16/2016  . Intractable cluster headache syndrome [G44.001]   . DVT (deep venous thrombosis) ([place]) [I82.409]   . Depression [F32.9]   . GERD (gastroesophageal reflux disease) [K21.9]   . HTN (hypertension) [I10]   . Essential hypertension [I10]   . Gastroesophageal reflux disease without esophagitis [K21.9]   . CAP (community acquired pneumonia) [J18.9]   . Multiple sclerosis ([place]) [G35] 08/02/2013  . ABDOMINAL BLOATING [R14.3, R14.1, R14.2] 10/14/2010  . LOOSE STOOLS [R19.7] 10/14/2010  . PULMONARY EMBOLISM, HX OF [Z86.718]  10/14/2010    Total Time spent with patient: 30 minutes  Subjective:   [name] is a 57 y.o. male patient admitted with Major Depressive Disorder, Recurrent, Severe With Psychotic Features.  HPI: Per the TTS assessment completed on 12/20/17 by Rico Sheehan: [name] is an 57 y.o. divorced male who presents unaccompanied to [facility] ED reports pain and weakness related to multiple sclerosis. Pt says is neurologist prescribed him oral steroids and that if his symptoms worsened to go to the ED. Pt says he needs additional steroids to address his pain and feels the neurologist at [facility] isn't helping him. When discharge plan was discussed with Pt he stated he would "go home and kill myself." During assessment Pt continues to endorse suicidal ideation with no specific plan, stating he would use "just some kind of way." Pt reports he has threatened to shoot himself in the past but denies any actual attempts. He says he has access to firearms. Pt acknowledges symptoms including crying spells, social withdrawal, loss of interest in usual pleasures, fatigue, irritability, decreased concentration, decreased sleep, decreased appetite and feelings of guilt and hopelessness. He reports current thoughts of wanting to harm his nephew, other family members and "just anybody." He does not specify any specific plan or intent. Pt reports he is experiencing auditory hallucinations which he cannot describe. Pt reports he has used drugs in the past but denies any drug use in the past three years. He says he rarely drinks alcohol.  Pt identifies his MS symptoms as his primary stressor. He describes generalized pain and says when he is stressed he has more difficulty speaking. Pt is on disability and cannot identify anyone in his life who is supportive. He lives with his mother,  brother and nephew. He has been divorced several times and has four children, ages 57, 57, 8 and 67. His minor children live with  their mother. Pt denies current legal problems. Pt denies any history of abuse or trauma. Pt denies any current mental health providers. Pt was psychiatrically hospitalized at [facility] in November 2018.  Pt is dressed in a t-shirt and covered with a blanket. He isalert and oriented x4. Pt speaks in a somewhat mumbledtone, at moderate volume and and with a pronounced stutter. Motor behavior appearssomewhat impaired. Eye contact is good. Pt's mood is depressed andirritable;affect is congruent with mood. Thought process is coherent and relevant. There is no indication Pt is currently responding to internal stimuli or experiencing delusional thought content. Pt was cooperative throughout assessment and appeared to be responding affirmatively to most symptoms but when asked to elaborate would give vague descriptions. He says he is willing to sign voluntarily into a psychiatric facility.   On Exam: Patient was seen via tele-psych, chart reviewed with treatment team. Patient in bed, awake, alert and oriented x4. Patient reiterated the reason for this hospital admission as documented above. Patient stated, "I came to the hospital because of my MS situations and I have a lot of problems". Patient reports that for the past two weeks, he has been dealing with depression secondary to generalized pain from his MS. He stated that he went to his neurologist and he gave him oral steroid but told him to go to the  Hospital for IV steroid if not better. Patient stating that he is in pain all over from his legs up to his shoulder and he wants to be given steroid IV medications, that he is mentally and physically sick, depressed and stressed out. Patient continues to endorse active suicide ideations with plan to shoot his leg off or chop it off. Patient stated he saw his psychiatrist last week Wednesday and he increased his Zoloft to '50mg'$  and it's still not effective. Patient reports  Patient endorsing visual hallucinations  of seeing things like cars and auditory hallucinations of hearing voices in his head while asleep. He denies that the voice are saying different things. Patient does not appear to be responding to internal stimuli. Patient was unable to contract for safety.   Past Psychiatric History: As in H&P  Risk to Self: Suicidal Ideation: Yes-Currently Present Suicidal Intent: Yes-Currently Present Is patient at risk for suicide?: Yes Suicidal Plan?: No Access to Means: Yes Specify Access to Suicidal Means: Pt reports he has access to firearms What has been your use of drugs/alcohol within the last 12 months?: Pt reports occasional marijuana use How many times?: 0 Other Self Harm Risks: None Triggers for Past Attempts: None known Intentional Self Injurious Behavior: None Risk to Others: Homicidal Ideation: Yes-Currently Present Thoughts of Harm to Others: Yes-Currently Present Comment - Thoughts of Harm to Others: Pt reports thoughts of harming his nephew and other family members Current Homicidal Intent: No Current Homicidal Plan: No Access to Homicidal Means: Yes Describe Access to Homicidal Means: Pt reports access to firearms Identified Victim: Nephew and other family History of harm to others?: Yes Assessment of Violence: In distant past Violent Behavior Description: Pt reports he has been arrested for assault in the past Does patient have access to weapons?: Yes (Comment) Criminal Charges Pending?: No Does patient have a court date: No Prior Inpatient Therapy: Prior Inpatient Therapy: Yes Prior Therapy Dates: 09/2017 Prior Therapy Facilty/Provider([facility]): [facility] Reason for Treatment: Depression Prior  Outpatient Therapy: Prior Outpatient Therapy: No Prior Therapy Dates: NA Prior Therapy Facilty/Provider(s): NA Reason for Treatment: NA Does patient have an ACCT team?: No Does patient have Intensive In-House Services?  : No Does patient have [facility] services? : No Does patient have P4[facility]  services?: No  Past Medical History:  Past Medical History:  Diagnosis Date  . Abdominal pain, unspecified site   . Anxiety   . Arthritis   . Benign paroxysmal positional vertigo   . Cellulitis and abscess of right leg 04/2017  . Chronic back pain   . Chronic pain syndrome 01/25/2008  . Cluster headache   . Depression    takes Zoloft daily  . DVT (deep venous thrombosis) ([facility])    in the left arm '09  . Gait abnormality    "uses mobile wheelchair, but is ambulatory"  . Gallstones 02/17/2009   resolved after gallbladder surgery.  [name] GERD (gastroesophageal reflux disease)    takes Omeprazole as needed  . Headache(784.0)    cluster headaches frequently-takes Topamax daily  . History of colonoscopy   . HTN (hypertension)    takes Lisinopril,Verapamil,and Triamterene HCTZ daily  . Insomnia 11/06/2008  . Joint pain   . Joint swelling    03-07-16 "swelling of right wrist" "after a fall-xray done 03-06-16 "no fractures".  . Memory loss    no an issue at present 03-07-16  . Multiple sclerosis ([place])    Dx. 2005 - Dr. Dellis Filbert follows [place] 4'17 tx. Tysabri monthly IV-[facility] , [place],[facility].  [name] Nonspecific elevation of levels of transaminase or lactic acid dehydrogenase (LDH)   . Other specified visual disturbances   . Other syndromes affecting cervical region   . Pneumonia 2009  . Trigeminal neuralgia     history" Multiple sclerosis"    Past Surgical History:  Procedure Laterality Date  . CHOLECYSTECTOMY  02/20/2009  . COLONOSCOPY WITH PROPOFOL N/A 03/17/2016   Procedure: COLONOSCOPY WITH PROPOFOL;  Surgeon: Laurence Spates, MD;  Location: [facility];  Service: Endoscopy;  Laterality: N/A;  . PORT A CATH REVISION N/A 07/06/2015   Procedure: Removal and replacement of PORT A CATH;  Surgeon: Fanny Skates, MD;  Location: [place];  Service: General;  Laterality: N/A;  . PORTACATH PLACEMENT N/A 03/27/2014   Procedure: INSERTION PORT-A-CATH;  Surgeon: Adin Hector, MD;   Location: [place];  Service: General;  Laterality: N/A;   Family History:  Family History  Problem Relation Age of Onset  . Cancer Father   . Diabetes Mother    Family Psychiatric  History: Unknown  Social History:  Social History   Substance and Sexual Activity  Alcohol Use Yes  . Alcohol/week: 0.0 oz   Comment: occasional     Social History   Substance and Sexual Activity  Drug Use No   Comment: Quit 2011    Social History   Socioeconomic History  . Marital status: Divorced    Spouse name: Not on file  . Number of children: 3  . Years of education: 43 th  . Highest education level: Not on file  Social Needs  . Financial resource strain: Not on file  . Food insecurity - worry: Not on file  . Food insecurity - inability: Not on file  . Transportation needs - medical: Not on file  . Transportation needs - non-medical: Not on file  Occupational History    Comment: Disabled  Tobacco Use  . Smoking status: Former Smoker    Packs/day: 1.00  Years: 38.00    Pack years: 38.00    Types: Cigarettes  . Smokeless tobacco: Never Used  . Tobacco comment: cutting back  Substance and Sexual Activity  . Alcohol use: Yes    Alcohol/week: 0.0 oz    Comment: occasional  . Drug use: No    Comment: Quit 2011  . Sexual activity: Not on file  Other Topics Concern  . Not on file  Social History Narrative   Patient is divorced. Patient is disabled. Because of his MS. Patient has 11 th grade education. 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    Right handed.   Caffeine- One daily.   Additional Social History:    Allergies:  No Known Allergies  Labs:  Results for orders placed or performed during the hospital encounter of 12/19/17 (from the past 48 hour(s))  CBC with Differential/Platelet     Status: Abnormal   Collection Time: 12/19/17 10:51 PM  Result Value Ref Range   WBC 15.9 (H) 4.0 - 10.5 K/uL   RBC 4.95 4.22 - 5.81 MIL/uL    Hemoglobin 13.5 13.0 - 17.0 g/dL   HCT 41.8 39.0 - 52.0 %   MCV 84.4 78.0 - 100.0 fL   MCH 27.3 26.0 - 34.0 pg   MCHC 32.3 30.0 - 36.0 g/dL   RDW 18.8 (H) 11.5 - 15.5 %   Platelets 174 150 - 400 K/uL   Neutrophils Relative % 95 %   Neutro Abs 15.0 (H) 1.7 - 7.7 K/uL   Lymphocytes Relative 3 %   Lymphs Abs 0.5 (L) 0.7 - 4.0 K/uL   Monocytes Relative 2 %   Monocytes Absolute 0.3 0.1 - 1.0 K/uL   Eosinophils Relative 0 %   Eosinophils Absolute 0.0 0.0 - 0.7 K/uL   Basophils Relative 0 %   Basophils Absolute 0.0 0.0 - 0.1 K/uL    Comment: Performed at [facility], [address] [place], [place] [address]  Comprehensive metabolic panel     Status: Abnormal   Collection Time: 12/19/17 10:51 PM  Result Value Ref Range   Sodium 138 135 - 145 mmol/L   Potassium 4.6 3.5 - 5.1 mmol/L   Chloride 109 101 - 111 mmol/L   CO2 20 (L) 22 - 32 mmol/L   Glucose, Bld 152 (H) 65 - 99 mg/dL   BUN 15 6 - 20 mg/dL   Creatinine, Ser 0.87 0.61 - 1.24 mg/dL   Calcium 8.4 (L) 8.9 - 10.3 mg/dL   Total Protein 5.8 (L) 6.5 - 8.1 g/dL   Albumin 3.2 (L) 3.5 - 5.0 g/dL   AST 27 15 - 41 U/L   ALT 89 (H) 17 - 63 U/L   Alkaline Phosphatase 90 38 - 126 U/L   Total Bilirubin 0.6 0.3 - 1.2 mg/dL   GFR calc non Af Amer >60 >60 mL/min   GFR calc Af Amer >60 >60 mL/min    Comment: (NOTE) The eGFR has been calculated using the CKD EPI equation. This calculation has not been validated in all clinical situations. eGFR's persistently <60 mL/min signify possible Chronic Kidney Disease.    Anion gap 9 5 - 15    Comment: Performed at [facility] [address], [place], [place] [address]  Urinalysis, Routine w reflex microscopic     Status: Abnormal   Collection Time: 12/20/17  3:00 AM  Result Value Ref Range   Color, Urine YELLOW YELLOW  APPearance HAZY (A) CLEAR   Specific Gravity, Urine 1.028 1.005 - 1.030   pH 5.0 5.0 - 8.0   Glucose, UA NEGATIVE NEGATIVE mg/dL   Hgb urine dipstick NEGATIVE  NEGATIVE   Bilirubin Urine NEGATIVE NEGATIVE   Ketones, ur NEGATIVE NEGATIVE mg/dL   Protein, ur NEGATIVE NEGATIVE mg/dL   Nitrite NEGATIVE NEGATIVE   Leukocytes, UA NEGATIVE NEGATIVE    Comment: Performed at [facility] [address] [place], [place] [address]    Medications:  Current Facility-Administered Medications  Medication Dose Route Frequency Provider Last Rate Last Dose  . gabapentin (NEURONTIN) capsule 600 mg  600 mg Oral QID Little, Wenda Overland, MD      . hydrochlorothiazide (HYDRODIURIL) tablet 25 mg  25 mg Oral Daily Hayden Rasmussen, MD   25 mg at 12/20/17 1127  . levETIRAcetam (KEPPRA) tablet 500 mg  500 mg Oral BID Hayden Rasmussen, MD   500 mg at 12/20/17 1128  . lisinopril-hydrochlorothiazide (PRINZIDE,ZESTORETIC) 10-12.5 MG per tablet 1 tablet  1 tablet Oral Daily Hayden Rasmussen, MD      . Oxcarbazepine (TRILEPTAL) tablet 300 mg  300 mg Oral TID Hayden Rasmussen, MD   300 mg at 12/20/17 1128  . pantoprazole (PROTONIX) EC tablet 40 mg  40 mg Oral Daily Hayden Rasmussen, MD   40 mg at 12/20/17 1128  . sertraline (ZOLOFT) tablet 50 mg  50 mg Oral Daily Hayden Rasmussen, MD   50 mg at 12/20/17 1129  . topiramate (TOPAMAX) tablet 25 mg  25 mg Oral QPM Hayden Rasmussen, MD      . topiramate (TOPAMAX) tablet 50 mg  50 mg Oral BID Hayden Rasmussen, MD   50 mg at 12/20/17 1129   Current Outpatient Medications  Medication Sig Dispense Refill  . baclofen (LIORESAL) 20 MG tablet Take 1 tablet (20 mg total) by mouth 4 (four) times daily. For muscle spasms (Patient taking differently: Take 20 mg by mouth 3 (three) times daily. 10a, 2pm, 10p - For muscle spasms) 30 each 0  . clotrimazole-betamethasone (LOTRISONE) cream Apply 1 application topically 2 (two) times daily as needed (itching/rash).   2  . dalfampridine 10 MG TB12 Take 1 tablet (10 mg total) by mouth 2 (two) times daily. For MS (Patient taking differently: Take 10 mg by mouth 2 (two) times daily.  "Ampyra" 10a and 10p -For MS) 10 tablet 0  . gabapentin (NEURONTIN) 600 MG tablet Take 600 mg by mouth 4 (four) times daily. 10am, 2pm, 6pm, 10pm  2  . hydrochlorothiazide (HYDRODIURIL) 25 MG tablet Take 25 mg by mouth daily.    [name] levETIRAcetam (KEPPRA) 500 MG tablet Take 500 mg by mouth 2 (two) times daily.    [name] lisinopril-hydrochlorothiazide (PRINZIDE,ZESTORETIC) 10-12.5 MG tablet Take 1 tablet by mouth daily.    . naloxone (NARCAN) nasal spray 4 mg/0.1 mL Place 1 spray into the nose once as needed (opiod overdose).    Wess Botts (OCREVUS IV) Inject into the vein every 6 (six) months.    [name] omeprazole (PRILOSEC) 20 MG capsule Take 20 mg by mouth 2 (two) times daily as needed (acid reflux/ indigestion).   1  . omeprazole (PRILOSEC) 40 MG capsule Take 1 capsule (40 mg total) by mouth every evening. For acid reflux (Patient taking differently: Take 40 mg by mouth 2 (two) times daily as needed (acid reflux - indigestion). ) 10 capsule 0  . Oxcarbazepine (TRILEPTAL) 300 MG tablet Take 1  tablet (300 mg total) by mouth 3 (three) times daily. For mood stabilization 90 tablet 0  . oxyCODONE-acetaminophen (PERCOCET) 10-325 MG tablet Take 1 tablet by mouth every 4 (four) hours as needed for pain. (Patient taking differently: Take 1 tablet by mouth every 4 (four) hours. ) 1 tablet 0  . sertraline (ZOLOFT) 50 MG tablet Take 1 tablet (50 mg total) by mouth daily. For depression 30 tablet 0  . topiramate (TOPAMAX) 25 MG tablet Take 25 mg by mouth every evening. 4pm  1  . topiramate (TOPAMAX) 50 MG tablet Take 50 mg by mouth 2 (two) times daily. 10a and 10p  1  . traZODone (DESYREL) 100 MG tablet Take 100 mg by mouth at bedtime as needed for sleep.     [name] triamterene-hydrochlorothiazide (DYAZIDE) 37.5-25 MG capsule Take 1 each (1 capsule total) by mouth daily. For high blood pressure 10 capsule 0  . verapamil (CALAN) 80 MG tablet Take 1 tablet (80 mg total) by mouth 2 (two) times daily. For high blood pressure  (Patient taking differently: Take 80 mg by mouth See admin instructions. Take one tablet (80 mg) by mouth twice daily - 2pm and 10pm. For high blood pressure) 20 tablet 0  . XARELTO 10 MG TABS tablet Take 1 tablet (10 mg total) by mouth daily. For blood clot prevention (Patient taking differently: Take 10 mg by mouth every evening. For blood clot prevention) 10 tablet 0  . ARIPiprazole (ABILIFY) 10 MG tablet Take 1 tablet (10 mg total) by mouth at bedtime. For mood control (Patient not taking: Reported on 12/19/2017) 30 tablet 0  . dicyclomine (BENTYL) 20 MG tablet Take 1 tablet (20 mg total) by mouth every 6 (six) hours as needed for spasms. (Patient not taking: Reported on 12/19/2017) 16 tablet 0  . gabapentin (NEURONTIN) 800 MG tablet Take 0.5 tablets (400 mg total) by mouth 3 (three) times daily. For agitation/pain (Patient not taking: Reported on 12/19/2017) 90 tablet 0  . hydrOXYzine (ATARAX/VISTARIL) 25 MG tablet Take 1 tablet (25 mg total) by mouth every 6 (six) hours as needed for anxiety. (Patient not taking: Reported on 12/19/2017) 60 tablet 0  . nortriptyline (PAMELOR) 50 MG capsule Take 1 capsule (50 mg total) by mouth at bedtime. For depression/sleep (Patient not taking: Reported on 12/19/2017) 30 capsule 0  . potassium chloride SA (KLOR-CON M20) 20 MEQ tablet Take 1 tablet (20 mEq total) by mouth daily. For potassium replacement (Patient not taking: Reported on 12/19/2017) 10 tablet 0  . topiramate (TOPAMAX) 100 MG tablet Take 1 tablet (100 mg total) by mouth at bedtime. For mood stabilization (Patient not taking: Reported on 12/19/2017) 30 tablet 0   Facility-Administered Medications Ordered in Other Encounters  Medication Dose Route Frequency Provider Last Rate Last Dose  . sodium chloride 0.9 % injection 10 mL  10 mL Intracatheter PRN Evlyn Kanner, NP   10 mL at 11/07/15 1012    Musculoskeletal: UTA via camera  Psychiatric Specialty Exam: Physical Exam  Nursing note and vitals  reviewed.   Review of Systems  Musculoskeletal: Falls:  stammers   Psychiatric/Behavioral: Positive for depression, hallucinations and suicidal ideas. Negative for substance abuse. The patient is nervous/anxious. The patient does not have insomnia.     Blood pressure 113/62, pulse 65, temperature 98.3 F (36.8 C), temperature source Oral, resp. rate 20, weight 83.5 kg (184 lb), SpO2 98 %.Body mass index is 27.16 kg/m.  General Appearance: Casual  Eye Contact:  Good  Speech:  stuttering but clear  Volume:  Normal  Mood:  Depressed and Irritable  Affect:  Congruent  Thought Process:  Coherent and Goal Directed  Orientation:  Full (Time, Place, and Person)  Thought Content:  WDL and Logical  Suicidal Thoughts:  Yes.  with intent/plan plan to shoot self with a gun  Homicidal Thoughts:  No  Memory:  Immediate;   Good Recent;   Good Remote;   Fair  Judgement:  Intact  Insight:  Present and Shallow  Psychomotor Activity:  Normal  Concentration:  Concentration: Good and Attention Span: Good  Recall:  Good  Fund of Knowledge:  Good  Language:  Good  Akathisia:  Negative  Handed:  Right  AIMS (if indicated):     Assets:  Communication Skills Desire for Improvement Financial Resources/Insurance Housing Social Support  ADL's:  Intact  Cognition:  WNL  Sleep:        Treatment Plan Summary: Daily contact with patient to assess and evaluate symptoms and progress in treatment, Medication management and Plan to psych inpatient for medication management and stabilization   Disposition: Recommend psychiatric Inpatient admission when medically cleared.  This service was provided via telemedicine using a 2-way, interactive audio and video technology.  Names of all persons participating in this telemedicine service and their role in this encounter. Name: [name] Role: Patient  Name: Liviah Cake A. Dak Szumski  Role: NP           Vicenta Aly, NP 12/20/2017 2:41  PM

## 2017-12-20 NOTE — ED Provider Notes (Signed)
Patient signed out to me at shift change pending MRI to evaluate for multiple sclerosis flare.  MRI is negative for acute flare.  MRI reviewed by Dr. Wilford Corner from neurology, who recommends outpatient follow-up with the patient's neurologist.  Patient noted to have a leukocytosis of 15.9, but has been taking prednisone.  Vital signs are stable.  Chest x-ray negative.   5:34 AM Discussed the results with the patient, who upon telling that he would be discharged is now claiming that he wants to just "go home and kill myself."  I have a high degree of suspicion for malingering and drug seeking behavior, but patient repeatedly requests to speak to someone regarding SI and repeatedly claims that if he is discharged he will go home and kill himself.  Patient is medically clear.  Will consult TTS.  dispo pending TTS.      Roxy Horseman, PA-C 12/20/17 [id number]    Glynn Octave, MD 12/20/17 [phone]

## 2017-12-20 NOTE — ED Notes (Signed)
Pt resting after breakfasts sitter bedside , no complaints

## 2017-12-20 NOTE — ED Notes (Signed)
Patient reports suicidal ideation ( plans to shoot himself) after PA explained discharge plan to pt.

## 2017-12-20 NOTE — ED Notes (Signed)
Portable video set up at bedside for TTS interview.

## 2017-12-20 NOTE — ED Notes (Signed)
Pharmacist notified on pt.'s order for Heparin Lock flush.

## 2017-12-21 ENCOUNTER — Emergency Department (HOSPITAL_COMMUNITY): Payer: 59

## 2017-12-21 DIAGNOSIS — R531 Weakness: Secondary | ICD-10-CM | POA: Diagnosis not present

## 2017-12-21 MED ORDER — KETOROLAC TROMETHAMINE 30 MG/ML IJ SOLN
30.0000 mg | Freq: Once | INTRAMUSCULAR | Status: AC
  Administered 2017-12-21: 30 mg via INTRAVENOUS
  Filled 2017-12-21: qty 1

## 2017-12-21 NOTE — ED Notes (Signed)
Lunch tray ordered 

## 2017-12-21 NOTE — ED Notes (Signed)
Pt requested to have someone help him wash up.  Water basin, soap, wash cloth and new set of scrubs given to pt.  Pt sat on the side of the bed to wash up.  Instructed pt not to stand up on his own.

## 2017-12-21 NOTE — ED Notes (Signed)
Pt is wondering what he is waiting on.  He's reminded that he is waiting for an inpt bed.

## 2017-12-21 NOTE — ED Notes (Signed)
Called to bathroom  RR12 pt had been assisted to BR via w/c and had tried to stand to pull up his pants and fell hitting his left  Face and  and mouth , was very weak, assisted back to w/c, Burna Forts PA into examine pt and noted  Pt was  On xeroalto and ordered ct scan, speech is slurred but BH had said his speech was slurred before and sitter does not think it is any worse .

## 2017-12-21 NOTE — ED Notes (Signed)
Requesting pain meds.  Order for toradol given per order.  Explained policy regarding narcotics while under psychiatric care.

## 2017-12-21 NOTE — ED Notes (Signed)
Pt. Given supplies to wash himself up in bed. Instructed not to get out of bed and to call for help. Pt. Given privacy and blinds cracked. Upon checking on pt., he was standing on his own after being instructed not to.

## 2017-12-21 NOTE — ED Notes (Signed)
Pt care assumed, obtained verbal report.  Pt is resting and appears comfortable.  Sitter at bedside.   

## 2017-12-21 NOTE — ED Provider Notes (Signed)
57 year old male status post fall.  I was called over by nursing staff noting that patient fell while in the bathroom.  Patient notes that he has history of MS his legs became weak and he fell while sitting on the toilet.  He notes striking the right cheek bone, no other trauma.  He reports pain over that area, denies any change in his baseline neurological deficits,, denies any significant headache.  Patient denies any vision changes or changes in extraocular movements.  Patient denies any neck or back pain.  Patient has very minimal signs of trauma to the face, chart review shows patient is on Xarelto he will have a head CT while here, low suspicion for maxillofacial fracture.  No other injuries noted on exam.  Head CT without acute abnormalities.      Eyvonne Mechanic, PA-C 12/21/17 1458    Gwyneth Sprout, MD 12/22/17 [phone]

## 2017-12-21 NOTE — ED Notes (Signed)
Pt. Went to the bathroom and this sitter was standing outside the door. Pt legs became weak and he fell. This sitter, RN, and EMT helped pt get into the wheelchair and to his room. Prior to this event, pt. Used the bathroom without any assistance.

## 2017-12-21 NOTE — BH Assessment (Addendum)
[facility] Assessment Progress Note Reassessment Pt's speech was slurred during reassessment. Pt reports he slept well and was tired still from pain medication. Pt denies SI/HI and AH/VH. Pt reported he felt safe to go home and would not harm himself. TTS called mother for collateral. Pt's mother reports she did not feel safe with pt coming home. Pt's mother reports she feels the pt would come home and harm himself.   Original note by Danae Orleans, Counselor  Addendum:  Pt.  Continues to meet inpatient criteria. Disposition to seek placement.

## 2017-12-21 NOTE — ED Notes (Signed)
Pt. Made a phone call.

## 2017-12-21 NOTE — ED Notes (Signed)
Pt is alert and is sitting on the side of the bed.  Use the phone to call someone.  After he got off the phone, pt started to curse stating he is not being helped with his MS.  Made pt aware that he is not allowed to have narcotics for pain per policy.  He's notified that his urine drug screen showed cocaine.  Pt states "yeah."  He is c/o of pain d/t his MS.

## 2017-12-22 DIAGNOSIS — R531 Weakness: Secondary | ICD-10-CM | POA: Diagnosis not present

## 2017-12-22 DIAGNOSIS — Z87891 Personal history of nicotine dependence: Secondary | ICD-10-CM | POA: Diagnosis not present

## 2017-12-22 DIAGNOSIS — F333 Major depressive disorder, recurrent, severe with psychotic symptoms: Secondary | ICD-10-CM | POA: Diagnosis not present

## 2017-12-22 MED ORDER — LORAZEPAM 1 MG PO TABS
1.0000 mg | ORAL_TABLET | Freq: Once | ORAL | Status: AC
Start: 2017-12-22 — End: 2017-12-22
  Administered 2017-12-22: 1 mg via ORAL
  Filled 2017-12-22: qty 1

## 2017-12-22 NOTE — ED Notes (Addendum)
Pt assisted w/changing into personal clothing after Sitter asked pt he needed to void and pt had stated no. Pt then had incont episode of urine. Pt cleansed by staff. Outpt resources discussed and given to pt - voiced understanding of importance to follow up as instructed.

## 2017-12-22 NOTE — ED Notes (Signed)
Family member/friend leaving at this time.

## 2017-12-22 NOTE — ED Notes (Addendum)
Pt woke up again, became agitated attempting to stand up.  Pt was asked several times not to stand up by himself.  He stated he wanted "some thing to help him sleep."  RN spoke to Dr. Holli Humbles who did prescribe medication which was given.  Pt took the medication w/o any issues.

## 2017-12-22 NOTE — Progress Notes (Signed)
Patient was re-assessed and no longer meets criteria for inpatient treatment per [facility] Physician Extender, Ferne Reus, NP.  [facility] ED Nurse, Kriste Basque B. Notified.  Referral info for IOP sent to the unit.  [name]. Kaylyn Lim, MSW, LCSWA Disposition Clinical Social Work [phone] (cell) [phone] (office)

## 2017-12-22 NOTE — ED Notes (Signed)
Pt states "I'm leaving" as he is attempting to get up out of bed. States he is not being given his pain med and he is not SI so he wants to leave. Requested for pt to wait for Telepsych results and for EDP to assess his MS.

## 2017-12-22 NOTE — ED Notes (Signed)
Pt's family member/friend has arrived to pick up pt when paperwork is ready.

## 2017-12-22 NOTE — Consult Note (Signed)
Telepsych Consultation   Reason for Consult: Depression with suicide ideations Referring Physician: Montine Circle, PA-C Location of Patient: [facility] Location of Provider: [facility]  Patient Identification: [name] MRN:  [id number] Principal Diagnosis: <principal problem not specified> Diagnosis:   Patient Active Problem List   Diagnosis Date Noted  . Severe major depression, single episode, with psychotic features ([place]) [F32.3] 10/04/2017  . Weakness [R53.1]   . Cellulitis of right leg [L03.115] 05/07/2017  . Right carpal tunnel syndrome [G56.01] 02/16/2017  . Gait disturbance [R26.9] 01/22/2017  . Insomnia [G47.00] 01/22/2017  . Depression with anxiety [F41.8] 01/22/2017  . High risk medication use [Z79.899] 01/22/2017  . Major depressive disorder, recurrent episode ([place]) [F33.9] 12/19/2016  . Suicidal ideation [R45.851]   . AKI (acute kidney injury) ([place]) [N17.9] 11/15/2016  . Chronic hepatitis C virus infection ([place]) [B18.2] 11/15/2016  . Chronic abdominal pain [R10.9, G89.29] 11/15/2016  . Chronic pain syndrome [G89.4] 11/15/2016  . Acute retention of urine [R33.8] 11/15/2016  . Generalized abdominal pain [R10.84]   . Chronic cluster headache, not intractable [G44.029]   . Community acquired pneumonia [J18.9]   . 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 HCAP (healthcare-associated pneumonia) [J18.9] 02/16/2016  . Hypokalemia [E87.6] 02/16/2016  . Intractable cluster headache syndrome [G44.001]   . DVT (deep venous thrombosis) ([place]) [I82.409]   . Depression [F32.9]   . GERD (gastroesophageal reflux disease) [K21.9]   . HTN (hypertension) [I10]   . Essential hypertension [I10]   . Gastroesophageal reflux disease without esophagitis [K21.9]   . CAP (community acquired pneumonia) [J18.9]   . Multiple sclerosis ([place]) [G35] 08/02/2013  . ABDOMINAL BLOATING [R14.3, R14.1, R14.2] 10/14/2010  . LOOSE STOOLS [R19.7] 10/14/2010  . PULMONARY EMBOLISM, HX OF [Z86.718]  10/14/2010    Total Time spent with patient: 30 minutes  Subjective:   [name] is a 57 y.o. male patient admitted with Major Depressive Disorder, Recurrent, Severe With Psychotic Features.  HPI: Per the TTS assessment completed on 12/20/17 by Rico Sheehan: [name] is an 57 y.o. divorced male who presents unaccompanied to [facility] ED reports pain and weakness related to multiple sclerosis. Pt says is neurologist prescribed him oral steroids and that if his symptoms worsened to go to the ED. Pt says he needs additional steroids to address his pain and feels the neurologist at [facility] isn't helping him. When discharge plan was discussed with Pt he stated he would "go home and kill myself." During assessment Pt continues to endorse suicidal ideation with no specific plan, stating he would use "just some kind of way." Pt reports he has threatened to shoot himself in the past but denies any actual attempts. He says he has access to firearms. Pt acknowledges symptoms including crying spells, social withdrawal, loss of interest in usual pleasures, fatigue, irritability, decreased concentration, decreased sleep, decreased appetite and feelings of guilt and hopelessness. He reports current thoughts of wanting to harm his nephew, other family members and "just anybody." He does not specify any specific plan or intent. Pt reports he is experiencing auditory hallucinations which he cannot describe. Pt reports he has used drugs in the past but denies any drug use in the past three years. He says he rarely drinks alcohol.  Pt identifies his MS symptoms as his primary stressor. He describes generalized pain and says when he is stressed he has more difficulty speaking. Pt is on disability and cannot identify anyone in his life who is supportive. He lives with his mother,  brother and nephew. He has been divorced several times and has four children, ages 34, 39, 66 and 71. His minor children live with  their mother. Pt denies current legal problems. Pt denies any history of abuse or trauma. Pt denies any current mental health providers. Pt was psychiatrically hospitalized at [facility] in November 2018.  Pt is dressed in a t-shirt and covered with a blanket. He isalert and oriented x4. Pt speaks in a somewhat mumbledtone, at moderate volume and and with a pronounced stutter. Motor behavior appearssomewhat impaired. Eye contact is good. Pt's mood is depressed andirritable;affect is congruent with mood. Thought process is coherent and relevant. There is no indication Pt is currently responding to internal stimuli or experiencing delusional thought content. Pt was cooperative throughout assessment and appeared to be responding affirmatively to most symptoms but when asked to elaborate would give vague descriptions. He says he is willing to sign voluntarily into a psychiatric facility.   On Exam 12/20/17: Patient was seen via tele-psych, chart reviewed with treatment team. Patient in bed, awake, alert and oriented x4. Patient reiterated the reason for this hospital admission as documented above. Patient stated, "I came to the hospital because of my MS situations and I have a lot of problems". Patient reports that for the past two weeks, he has been dealing with depression secondary to generalized pain from his MS. He stated that he went to his neurologist and he gave him oral steroid but told him to go to the  Hospital for IV steroid if not better. Patient stating that he is in pain all over from his legs up to his shoulder and he wants to be given steroid IV medications, that he is mentally and physically sick, depressed and stressed out. Patient continues to endorse active suicide ideations with plan to shoot his leg off or chop it off. Patient stated he saw his psychiatrist last week Wednesday and he increased his Zoloft to 50mg  and it's still not effective. Patient reports  Patient endorsing visual  hallucinations of seeing things like cars and auditory hallucinations of hearing voices in his head while asleep. He denies that the voice are saying different things. Patient does not appear to be responding to internal stimuli. Patient was unable to contract for safety.   On Exam: Patient was reassessed by this writer today, he was sitting up in bed awake, alert and oriented 3. Patient stated that he is doing much better today, he reported feeling better all around. Patient stated that he is no longer having thoughts of self-harm or harm to others. Patient reported that he has an upcoming appointment with his neurologist on February 18. Patient stated that he will return home where he stays with his mother and brother whenever discharge. Patient denies any acute concerns at this time. Patient does not appear to be responding to internal stimuli.  Past Psychiatric History: As in H&P  Risk to Self: Suicidal Ideation: Yes-Currently Present Suicidal Intent: Yes-Currently Present Is patient at risk for suicide?: Yes Suicidal Plan?: No Access to Means: Yes Specify Access to Suicidal Means: Pt reports he has access to firearms What has been your use of drugs/alcohol within the last 12 months?: Pt reports occasional marijuana use How many times?: 0 Other Self Harm Risks: None Triggers for Past Attempts: None known Intentional Self Injurious Behavior: None Risk to Others: Homicidal Ideation: Yes-Currently Present Thoughts of Harm to Others: Yes-Currently Present Comment - Thoughts of Harm to Others: Pt reports thoughts of harming  his nephew and other family members Current Homicidal Intent: No Current Homicidal Plan: No Access to Homicidal Means: Yes Describe Access to Homicidal Means: Pt reports access to firearms Identified Victim: Nephew and other family History of harm to others?: Yes Assessment of Violence: In distant past Violent Behavior Description: Pt reports he has been arrested for  assault in the past Does patient have access to weapons?: Yes (Comment) Criminal Charges Pending?: No Does patient have a court date: No Prior Inpatient Therapy: Prior Inpatient Therapy: Yes Prior Therapy Dates: 09/2017 Prior Therapy Facilty/Provider(s): [facility] Reason for Treatment: Depression Prior Outpatient Therapy: Prior Outpatient Therapy: No Prior Therapy Dates: NA Prior Therapy Facilty/Provider(s): NA Reason for Treatment: NA Does patient have an ACCT team?: No Does patient have Intensive In-House Services?  : No Does patient have [facility] services? : No Does patient have P4CC services?: No  Past Medical History:  Past Medical History:  Diagnosis Date  . Abdominal pain, unspecified site   . Anxiety   . Arthritis   . Benign paroxysmal positional vertigo   . Cellulitis and abscess of right leg 04/2017  . Chronic back pain   . Chronic pain syndrome 01/25/2008  . Cluster headache   . Depression    takes Zoloft daily  . DVT (deep venous thrombosis) (H[facility])    in the left arm '09  . Gait abnormality    "uses mobile wheelchair, but is ambulatory"  . Gallstones 02/17/2009   resolved after gallbladder surgery.  [name] GERD (gastroesophageal reflux disease)    takes Omeprazole as needed  . Headache(784.0)    cluster headaches frequently-takes Topamax daily  . History of colonoscopy   . HTN (hypertension)    takes Lisinopril,Verapamil,and Triamterene HCTZ daily  . Insomnia 11/06/2008  . Joint pain   . Joint swelling    03-07-16 "swelling of right wrist" "after a fall-xray done 03-06-16 "no fractures".  . Memory loss    no an issue at present 03-07-16  . Multiple sclerosis (HCC)    Dx. 2005 - Dr. Tinnie Gens follows LOV 4'17 tx. Tysabri monthly IV-[facility] , [place].  [name] Nonspecific elevation of levels of transaminase or lactic acid dehydrogenase (LDH)   . Other specified visual disturbances   . Other syndromes affecting cervical region   . Pneumonia 2009  .  Trigeminal neuralgia     history" Multiple sclerosis"    Past Surgical History:  Procedure Laterality Date  . CHOLECYSTECTOMY  02/20/2009  . COLONOSCOPY WITH PROPOFOL N/A 03/17/2016   Procedure: COLONOSCOPY WITH PROPOFOL;  Surgeon: [name], MD;  Location: [facility] ENDOSCOPY;  Service: Endoscopy;  Laterality: N/A;  . PORT A CATH REVISION N/A 07/06/2015   Procedure: Removal and replacement of PORT A CATH;  Surgeon: [name], MD;  Location: [facility] OR;  Service: General;  Laterality: N/A;  . PORTACATH PLACEMENT N/A 03/27/2014   Procedure: INSERTION PORT-A-CATH;  Surgeon: [name], MD;  Location: [facility] OR;  Service: General;  Laterality: N/A;   Family History:  Family History  Problem Relation Age of Onset  . Cancer Father   . Diabetes Mother    Family Psychiatric  History: Unknown  Social History:  Social History   Substance and Sexual Activity  Alcohol Use Yes  . Alcohol/week: 0.0 oz   Comment: occasional     Social History   Substance and Sexual Activity  Drug Use No   Comment: Quit 2011    Social History   Socioeconomic History  . Marital status: Divorced  Spouse name: Not on file  . Number of children: 3  . Years of education: 32 th  . Highest education level: Not on file  Social Needs  . Financial resource strain: Not on file  . Food insecurity - worry: Not on file  . Food insecurity - inability: Not on file  . Transportation needs - medical: Not on file  . Transportation needs - non-medical: Not on file  Occupational History    Comment: Disabled  Tobacco Use  . Smoking status: Former Smoker    Packs/day: 1.00    Years: 38.00    Pack years: 38.00    Types: Cigarettes  . Smokeless tobacco: Never Used  . Tobacco comment: cutting back  Substance and Sexual Activity  . Alcohol use: Yes    Alcohol/week: 0.0 oz    Comment: occasional  . Drug use: No    Comment: Quit 2011  . Sexual activity: Not on file  Other Topics Concern  . Not on file  Social  History Narrative   Patient is divorced. Patient is disabled. Because of his MS. Patient has 11 th grade education. 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    Right handed.   Caffeine- One daily.   Additional Social History:    Allergies:  No Known Allergies  Labs:  No results found for this or any previous visit (from the past 48 hour(s)).  Medications:  Current Facility-Administered Medications  Medication Dose Route Frequency Provider Last Rate Last Dose  . gabapentin (NEURONTIN) capsule 600 mg  600 mg Oral QID Little, Ambrose Finland, MD   600 mg at 12/21/17 2152  . hydrochlorothiazide (HYDRODIURIL) tablet 25 mg  25 mg Oral Daily Terrilee Files, MD   25 mg at 12/20/17 1127  . lisinopril (PRINIVIL,ZESTRIL) tablet 10 mg  10 mg Oral Daily Little, Ambrose Finland, MD   10 mg at 12/20/17 1715   And  . hydrochlorothiazide (MICROZIDE) capsule 12.5 mg  12.5 mg Oral Daily Little, Ambrose Finland, MD   12.5 mg at 12/20/17 1715  . levETIRAcetam (KEPPRA) tablet 500 mg  500 mg Oral BID Terrilee Files, MD   500 mg at 12/21/17 2152  . Oxcarbazepine (TRILEPTAL) tablet 300 mg  300 mg Oral TID Terrilee Files, MD   300 mg at 12/21/17 2151  . pantoprazole (PROTONIX) EC tablet 40 mg  40 mg Oral Daily Terrilee Files, MD   40 mg at 12/21/17 0947  . sertraline (ZOLOFT) tablet 50 mg  50 mg Oral Daily Terrilee Files, MD   50 mg at 12/21/17 0942  . topiramate (TOPAMAX) tablet 25 mg  25 mg Oral QPM Terrilee Files, MD   25 mg at 12/21/17 1818  . topiramate (TOPAMAX) tablet 50 mg  50 mg Oral BID Terrilee Files, MD   50 mg at 12/21/17 2152   Current Outpatient Medications  Medication Sig Dispense Refill  . baclofen (LIORESAL) 20 MG tablet Take 1 tablet (20 mg total) by mouth 4 (four) times daily. For muscle spasms (Patient taking differently: Take 20 mg by mouth 3 (three) times daily. 10a, 2pm, 10p - For muscle spasms) 30 each 0  . clotrimazole-betamethasone  (LOTRISONE) cream Apply 1 application topically 2 (two) times daily as needed (itching/rash).   2  . dalfampridine 10 MG TB12 Take 1 tablet (10 mg total) by mouth 2 (two) times daily. For MS (Patient taking differently: Take 10 mg by mouth 2 (  two) times daily. "Ampyra" 10a and 10p -For MS) 10 tablet 0  . gabapentin (NEURONTIN) 600 MG tablet Take 600 mg by mouth 4 (four) times daily. 10am, 2pm, 6pm, 10pm  2  . hydrochlorothiazide (HYDRODIURIL) 25 MG tablet Take 25 mg by mouth daily.    [name] levETIRAcetam (KEPPRA) 500 MG tablet Take 500 mg by mouth 2 (two) times daily.    [name] lisinopril-hydrochlorothiazide (PRINZIDE,ZESTORETIC) 10-12.5 MG tablet Take 1 tablet by mouth daily.    . naloxone (NARCAN) nasal spray 4 mg/0.1 mL Place 1 spray into the nose once as needed (opiod overdose).    Fran Lowes (OCREVUS IV) Inject into the vein every 6 (six) months.    [name] omeprazole (PRILOSEC) 20 MG capsule Take 20 mg by mouth 2 (two) times daily as needed (acid reflux/ indigestion).   1  . omeprazole (PRILOSEC) 40 MG capsule Take 1 capsule (40 mg total) by mouth every evening. For acid reflux (Patient taking differently: Take 40 mg by mouth 2 (two) times daily as needed (acid reflux - indigestion). ) 10 capsule 0  . Oxcarbazepine (TRILEPTAL) 300 MG tablet Take 1 tablet (300 mg total) by mouth 3 (three) times daily. For mood stabilization 90 tablet 0  . oxyCODONE-acetaminophen (PERCOCET) 10-325 MG tablet Take 1 tablet by mouth every 4 (four) hours as needed for pain. (Patient taking differently: Take 1 tablet by mouth every 4 (four) hours. ) 1 tablet 0  . sertraline (ZOLOFT) 50 MG tablet Take 1 tablet (50 mg total) by mouth daily. For depression 30 tablet 0  . topiramate (TOPAMAX) 25 MG tablet Take 25 mg by mouth every evening. 4pm  1  . topiramate (TOPAMAX) 50 MG tablet Take 50 mg by mouth 2 (two) times daily. 10a and 10p  1  . traZODone (DESYREL) 100 MG tablet Take 100 mg by mouth at bedtime as needed for sleep.     [name]  triamterene-hydrochlorothiazide (DYAZIDE) 37.5-25 MG capsule Take 1 each (1 capsule total) by mouth daily. For high blood pressure 10 capsule 0  . verapamil (CALAN) 80 MG tablet Take 1 tablet (80 mg total) by mouth 2 (two) times daily. For high blood pressure (Patient taking differently: Take 80 mg by mouth See admin instructions. Take one tablet (80 mg) by mouth twice daily - 2pm and 10pm. For high blood pressure) 20 tablet 0  . XARELTO 10 MG TABS tablet Take 1 tablet (10 mg total) by mouth daily. For blood clot prevention (Patient taking differently: Take 10 mg by mouth every evening. For blood clot prevention) 10 tablet 0  . ARIPiprazole (ABILIFY) 10 MG tablet Take 1 tablet (10 mg total) by mouth at bedtime. For mood control (Patient not taking: Reported on 12/19/2017) 30 tablet 0  . dicyclomine (BENTYL) 20 MG tablet Take 1 tablet (20 mg total) by mouth every 6 (six) hours as needed for spasms. (Patient not taking: Reported on 12/19/2017) 16 tablet 0  . gabapentin (NEURONTIN) 800 MG tablet Take 0.5 tablets (400 mg total) by mouth 3 (three) times daily. For agitation/pain (Patient not taking: Reported on 12/19/2017) 90 tablet 0  . hydrOXYzine (ATARAX/VISTARIL) 25 MG tablet Take 1 tablet (25 mg total) by mouth every 6 (six) hours as needed for anxiety. (Patient not taking: Reported on 12/19/2017) 60 tablet 0  . nortriptyline (PAMELOR) 50 MG capsule Take 1 capsule (50 mg total) by mouth at bedtime. For depression/sleep (Patient not taking: Reported on 12/19/2017) 30 capsule 0  . potassium chloride SA (KLOR-CON M20) 20  MEQ tablet Take 1 tablet (20 mEq total) by mouth daily. For potassium replacement (Patient not taking: Reported on 12/19/2017) 10 tablet 0  . topiramate (TOPAMAX) 100 MG tablet Take 1 tablet (100 mg total) by mouth at bedtime. For mood stabilization (Patient not taking: Reported on 12/19/2017) 30 tablet 0   Facility-Administered Medications Ordered in Other Encounters  Medication Dose Route Frequency  Provider Last Rate Last Dose  . sodium chloride 0.9 % injection 10 mL  10 mL Intracatheter PRN Loann Quill, NP   10 mL at 11/07/15 1012    Musculoskeletal: UTA via camera  Psychiatric Specialty Exam: Physical Exam  Nursing note and vitals reviewed.   Review of Systems  Musculoskeletal: Falls:  stammers   Psychiatric/Behavioral: Positive for depression, hallucinations and suicidal ideas. Negative for substance abuse. The patient is nervous/anxious. The patient does not have insomnia.     Blood pressure 129/72, pulse 60, temperature 97.7 F (36.5 C), temperature source Oral, resp. rate 17, weight 83.5 kg (184 lb), SpO2 100 %.Body mass index is 27.16 kg/m.  General Appearance: Casual  Eye Contact:  Good  Speech:  stuttering but clear  Volume:  Normal  Mood:  Euthymic  Affect:  Congruent  Thought Process:  Coherent and Goal Directed  Orientation:  Full (Time, Place, and Person)  Thought Content:  WDL and Logical  Suicidal Thoughts:  No   Homicidal Thoughts:  No  Memory:  Immediate;   Good Recent;   Good Remote;   Fair  Judgement:  Intact  Insight:  Present and Shallow  Psychomotor Activity:  Normal  Concentration:  Concentration: Good and Attention Span: Good  Recall:  Good  Fund of Knowledge:  Good  Language:  Good  Akathisia:  Negative  Handed:  Right  AIMS (if indicated):     Assets:  Communication Skills Desire for Improvement Financial Resources/Insurance Housing Social Support  ADL's:  Intact  Cognition:  WNL  Sleep:       Treatment Plan recommendations as discussed and agreed with Dr. Lucianne Muss:  Treatment Plan Summary: Plan to discharge home when medically cleared Follow up with your OP Provider Follow up with your neurologist as scheduled on 12/28/17 Follow up with Social Work consult for Care coordination Take all medications as prescribed Avoid the use of alcohol and/or drugs Stay well hydrated Activity as tolerated Follow up with PCP for any  new or existing medical concerns Go to your PCP or return to the ED with any new or worsening symptoms  Disposition: No evidence of imminent risk to self or others at present.   Patient does not meet criteria for psychiatric inpatient admission. Supportive therapy provided about ongoing stressors. Refer to IOP. Discussed crisis plan, support from social network, calling 911, coming to the Emergency Department, and calling Suicide Hotline.  This service was provided via telemedicine using a 2-way, interactive audio and video technology.  Names of all persons participating in this telemedicine service and their role in this encounter. Name: [name] Role: Patient  Name: [name]  Role: NP           Delila Pereyra, NP 12/22/2017 10:02 AM

## 2017-12-22 NOTE — ED Notes (Signed)
Pt woke up, attempting to get out of bed.  He appeared to be still sleeping.  Speech was initially non-sense but pt was able to wake up and make sense of the situation. He asked for a drink which was given and layed back in bed.

## 2017-12-22 NOTE — ED Notes (Signed)
Regular Diet was ordered for Lunch. 

## 2017-12-22 NOTE — ED Notes (Signed)
Pt soiled his scrub pants and underwear w/feces - new scrub pants put on pt and underwear thrown away - Pt aware.

## 2017-12-22 NOTE — ED Notes (Signed)
Telepsych completed.  

## 2017-12-22 NOTE — ED Notes (Signed)
Pt asking when he will be d/c'd. Advised pt waiting on EDP. Voiced understanding. Pt on phone w/mother.

## 2017-12-22 NOTE — ED Notes (Signed)
Pt agitated, wanting "something more to help me sleep."  Explained that the medication that was given has not had enough time to work.

## 2017-12-22 NOTE — ED Notes (Signed)
Pt continues to ask to speak w/RN - states "I am leaving". Advised pt of [facility] recommendation and to please wait for EDP to assess and perform d/c paperwork.

## 2017-12-22 NOTE — ED Notes (Signed)
Pt asking to "see the dr so I can leave". Advised pt had Telepsych and NP, [facility], will discuss w/tx team on his plan. Voiced understanding.

## 2017-12-22 NOTE — ED Notes (Signed)
States he needs steroids for MS flare in his legs. States if he is not treated then "I'm getting up and leaving here". Pt noted w/weakness in his legs - pt unsteady on feet. Bedside commode at bedside. Advised pt will notify EDP.

## 2018-01-01 ENCOUNTER — Ambulatory Visit: Payer: Self-pay | Admitting: Oncology

## 2018-01-01 ENCOUNTER — Ambulatory Visit: Payer: Self-pay

## 2018-01-05 ENCOUNTER — Telehealth (INDEPENDENT_AMBULATORY_CARE_PROVIDER_SITE_OTHER): Payer: Self-pay | Admitting: Orthopaedic Surgery

## 2018-01-05 NOTE — Telephone Encounter (Signed)
Patient is requesting cortisone injections in both knees at his appt made for Monday. He said Dr. Roda Shutters has done this before but it looks like his last visit was in 2015. I'm sure Dr. Roda Shutters will want to discuss first, just thought I'd let you know!

## 2018-01-06 NOTE — Telephone Encounter (Signed)
Called to let patient know, he has sched appt for monday

## 2018-01-06 NOTE — Telephone Encounter (Signed)
See message below °

## 2018-01-06 NOTE — Telephone Encounter (Signed)
Yeah he needs appt first

## 2018-01-09 NOTE — Progress Notes (Deleted)
[facility]  Telephone:([phone] [phone]  ID: Simmie Davies OB: June 08, 1961  MR#: [id number]  BTD#:[id number]  Patient Care Team: Fleet Contras, MD as PCP - General (Internal Medicine) Christoper Allegra., MD as Referring Physician (Neurology)  CHIEF COMPLAINT:  Ocrevus infusions for MS  INTERVAL HISTORY: Patient returns to clinic today for further evaluation and initiation of Ocrevus. Currently, he feels well and is at his baseline.  He has no new neurologic complaints.  He denies any recent fevers.  He has a good appetite and denies weight loss.  He has no chest pain or shortness of breath.  He denies any nausea, vomiting, constipation, or diarrhea.  He has no urinary complaints.  Patient offers no specific complaints today.  REVIEW OF SYSTEMS:   Review of Systems  Constitutional: Negative.  Negative for fever, malaise/fatigue and weight loss.  Respiratory: Negative.  Negative for cough and shortness of breath.   Cardiovascular: Negative.  Negative for chest pain and leg swelling.  Gastrointestinal: Negative.  Negative for nausea and vomiting.  Genitourinary: Negative.   Musculoskeletal: Negative.   Skin: Negative.  Negative for rash.  Neurological: Positive for focal weakness. Negative for sensory change and weakness.  Psychiatric/Behavioral: Negative.  The patient is not nervous/anxious.     As per HPI. Otherwise, a complete review of systems is negative.  PAST MEDICAL HISTORY: Past Medical History:  Diagnosis Date  . Abdominal pain, unspecified site   . Anxiety   . Arthritis   . Benign paroxysmal positional vertigo   . Cellulitis and abscess of right leg 04/2017  . Chronic back pain   . Chronic pain syndrome 01/25/2008  . Cluster headache   . Depression    takes Zoloft daily  . DVT (deep venous thrombosis) (HCC)    in the left arm '09  . Gait abnormality    "uses mobile wheelchair, but is ambulatory"  . Gallstones 02/17/2009   resolved after gallbladder surgery.  [name] GERD (gastroesophageal reflux disease)    takes Omeprazole as needed  . Headache(784.0)    cluster headaches frequently-takes Topamax daily  . History of colonoscopy   . HTN (hypertension)    takes Lisinopril,Verapamil,and Triamterene HCTZ daily  . Insomnia 11/06/2008  . Joint pain   . Joint swelling    03-07-16 "swelling of right wrist" "after a fall-xray done 03-06-16 "no fractures".  . Memory loss    no an issue at present 03-07-16  . Multiple sclerosis (HCC)    Dx. 2005 - Dr. Tinnie Gens follows LOV 4'17 tx. Tysabri monthly IV-[facility] , [place].  [name] Nonspecific elevation of levels of transaminase or lactic acid dehydrogenase (LDH)   . Other specified visual disturbances   . Other syndromes affecting cervical region   . Pneumonia 2009  . Trigeminal neuralgia     history" Multiple sclerosis"    PAST SURGICAL HISTORY: Past Surgical History:  Procedure Laterality Date  . CHOLECYSTECTOMY  02/20/2009  . COLONOSCOPY WITH PROPOFOL N/A 03/17/2016   Procedure: COLONOSCOPY WITH PROPOFOL;  Surgeon: [name], MD;  Location: [facility] ENDOSCOPY;  Service: Endoscopy;  Laterality: N/A;  . PORT A CATH REVISION N/A 07/06/2015   Procedure: Removal and replacement of PORT A CATH;  Surgeon: [name], MD;  Location: [facility] OR;  Service: General;  Laterality: N/A;  . PORTACATH PLACEMENT N/A 03/27/2014   Procedure: INSERTION PORT-A-CATH;  Surgeon: [name], MD;  Location: [facility] OR;  Service: General;  Laterality: N/A;    FAMILY HISTORY Family History  Problem Relation Age of Onset  . Cancer Father   . Diabetes Mother        ADVANCED DIRECTIVES:    HEALTH MAINTENANCE: Social History   Tobacco Use  . Smoking status: Former Smoker    Packs/day: 1.00    Years: 38.00    Pack years: 38.00    Types: Cigarettes  . Smokeless tobacco: Never Used  . Tobacco comment: cutting back  Substance Use Topics  . Alcohol use: Yes    Alcohol/week:  0.0 oz    Comment: occasional  . Drug use: No    Comment: Quit 2011     Colonoscopy:  PAP:  Bone density:  Lipid panel:  No Known Allergies  Current Outpatient Medications  Medication Sig Dispense Refill  . ARIPiprazole (ABILIFY) 10 MG tablet Take 1 tablet (10 mg total) by mouth at bedtime. For mood control (Patient not taking: Reported on 12/19/2017) 30 tablet 0  . baclofen (LIORESAL) 20 MG tablet Take 1 tablet (20 mg total) by mouth 4 (four) times daily. For muscle spasms (Patient taking differently: Take 20 mg by mouth 3 (three) times daily. 10a, 2pm, 10p - For muscle spasms) 30 each 0  . clotrimazole-betamethasone (LOTRISONE) cream Apply 1 application topically 2 (two) times daily as needed (itching/rash).   2  . dalfampridine 10 MG TB12 Take 1 tablet (10 mg total) by mouth 2 (two) times daily. For MS (Patient taking differently: Take 10 mg by mouth 2 (two) times daily. "Ampyra" 10a and 10p -For MS) 10 tablet 0  . dicyclomine (BENTYL) 20 MG tablet Take 1 tablet (20 mg total) by mouth every 6 (six) hours as needed for spasms. (Patient not taking: Reported on 12/19/2017) 16 tablet 0  . gabapentin (NEURONTIN) 600 MG tablet Take 600 mg by mouth 4 (four) times daily. 10am, 2pm, 6pm, 10pm  2  . gabapentin (NEURONTIN) 800 MG tablet Take 0.5 tablets (400 mg total) by mouth 3 (three) times daily. For agitation/pain (Patient not taking: Reported on 12/19/2017) 90 tablet 0  . hydrochlorothiazide (HYDRODIURIL) 25 MG tablet Take 25 mg by mouth daily.    . hydrOXYzine (ATARAX/VISTARIL) 25 MG tablet Take 1 tablet (25 mg total) by mouth every 6 (six) hours as needed for anxiety. (Patient not taking: Reported on 12/19/2017) 60 tablet 0  . levETIRAcetam (KEPPRA) 500 MG tablet Take 500 mg by mouth 2 (two) times daily.    [name] lisinopril-hydrochlorothiazide (PRINZIDE,ZESTORETIC) 10-12.5 MG tablet Take 1 tablet by mouth daily.    . naloxone (NARCAN) nasal spray 4 mg/0.1 mL Place 1 spray into the nose once as needed  (opiod overdose).    . nortriptyline (PAMELOR) 50 MG capsule Take 1 capsule (50 mg total) by mouth at bedtime. For depression/sleep (Patient not taking: Reported on 12/19/2017) 30 capsule 0  . Ocrelizumab (OCREVUS IV) Inject into the vein every 6 (six) months.    [name] omeprazole (PRILOSEC) 20 MG capsule Take 20 mg by mouth 2 (two) times daily as needed (acid reflux/ indigestion).   1  . omeprazole (PRILOSEC) 40 MG capsule Take 1 capsule (40 mg total) by mouth every evening. For acid reflux (Patient taking differently: Take 40 mg by mouth 2 (two) times daily as needed (acid reflux - indigestion). ) 10 capsule 0  . Oxcarbazepine (TRILEPTAL) 300 MG tablet Take 1 tablet (300 mg total) by mouth 3 (three) times daily. For mood stabilization 90 tablet 0  . oxyCODONE-acetaminophen (PERCOCET) 10-325 MG tablet Take 1 tablet by mouth every 4 (four)  hours as needed for pain. (Patient taking differently: Take 1 tablet by mouth every 4 (four) hours. ) 1 tablet 0  . potassium chloride SA (KLOR-CON M20) 20 MEQ tablet Take 1 tablet (20 mEq total) by mouth daily. For potassium replacement (Patient not taking: Reported on 12/19/2017) 10 tablet 0  . sertraline (ZOLOFT) 50 MG tablet Take 1 tablet (50 mg total) by mouth daily. For depression 30 tablet 0  . topiramate (TOPAMAX) 100 MG tablet Take 1 tablet (100 mg total) by mouth at bedtime. For mood stabilization (Patient not taking: Reported on 12/19/2017) 30 tablet 0  . topiramate (TOPAMAX) 25 MG tablet Take 25 mg by mouth every evening. 4pm  1  . topiramate (TOPAMAX) 50 MG tablet Take 50 mg by mouth 2 (two) times daily. 10a and 10p  1  . traZODone (DESYREL) 100 MG tablet Take 100 mg by mouth at bedtime as needed for sleep.     [name] triamterene-hydrochlorothiazide (DYAZIDE) 37.5-25 MG capsule Take 1 each (1 capsule total) by mouth daily. For high blood pressure 10 capsule 0  . verapamil (CALAN) 80 MG tablet Take 1 tablet (80 mg total) by mouth 2 (two) times daily. For high blood  pressure (Patient taking differently: Take 80 mg by mouth See admin instructions. Take one tablet (80 mg) by mouth twice daily - 2pm and 10pm. For high blood pressure) 20 tablet 0  . XARELTO 10 MG TABS tablet Take 1 tablet (10 mg total) by mouth daily. For blood clot prevention (Patient taking differently: Take 10 mg by mouth every evening. For blood clot prevention) 10 tablet 0   No current facility-administered medications for this visit.    Facility-Administered Medications Ordered in Other Visits  Medication Dose Route Frequency Provider Last Rate Last Dose  . sodium chloride 0.9 % injection 10 mL  10 mL Intracatheter PRN Loann Quill, NP   10 mL at 11/07/15 1012    OBJECTIVE: There were no vitals filed for this visit.   There is no height or weight on file to calculate BMI.    ECOG FS:1 - Symptomatic but completely ambulatory  General: Well-developed, well-nourished, no acute distress.  Eyes: anicteric sclera. Lungs: Clear to auscultation bilaterally. Heart: Regular rate and rhythm. No rubs, murmurs, or gallops. Abdomen: Soft, nontender, nondistended. No organomegaly noted, normoactive bowel sounds. Musculoskeletal: No edema, cyanosis, or clubbing. Neuro: Alert, answering all questions appropriately. Cranial nerves grossly intact. Skin: No rashes or petechiae noted. Psych: Normal affect.   LAB RESULTS:  Lab Results  Component Value Date   NA 138 12/19/2017   K 4.6 12/19/2017   CL 109 12/19/2017   CO2 20 (L) 12/19/2017   GLUCOSE 152 (H) 12/19/2017   BUN 15 12/19/2017   CREATININE 0.87 12/19/2017   CALCIUM 8.4 (L) 12/19/2017   PROT 5.8 (L) 12/19/2017   ALBUMIN 3.2 (L) 12/19/2017   AST 27 12/19/2017   ALT 89 (H) 12/19/2017   ALKPHOS 90 12/19/2017   BILITOT 0.6 12/19/2017   GFRNONAA >60 12/19/2017   GFRAA >60 12/19/2017    Lab Results  Component Value Date   WBC 15.9 (H) 12/19/2017   NEUTROABS 15.0 (H) 12/19/2017   HGB 13.5 12/19/2017   HCT 41.8 12/19/2017    MCV 84.4 12/19/2017   PLT 174 12/19/2017     STUDIES: Dg Chest 2 View  Result Date: 12/19/2017 CLINICAL DATA:  Pt presents to the ed for a MS flare up x 3 weeks states that he went to his doctor  who told him that if he wasn't feeling better with the oral steroids they gave him then he needed to come to the ED. Complaining of pain in his arms, legs and a headache. EXAM: CHEST  2 VIEW COMPARISON:  10/03/2017 FINDINGS: Cardiac silhouette is normal in size. No mediastinal or hilar masses. No convincing adenopathy. Clear lungs. No pleural effusion or pneumothorax. Right anterior chest wall, internal jugular, Port-A-Cath has its tip at the caval atrial junction, unchanged. Skeletal structures are intact. IMPRESSION: No active cardiopulmonary disease. Electronically Signed   By: Amie Portland M.D.   On: 12/19/2017 23:39   Ct Head Wo Contrast  Result Date: 12/21/2017 CLINICAL DATA:  Fall EXAM: CT HEAD WITHOUT CONTRAST TECHNIQUE: Contiguous axial images were obtained from the base of the skull through the vertex without intravenous contrast. COMPARISON:  Head CT 05/07/2017 FINDINGS: Brain: No mass lesion, intraparenchymal hemorrhage or extra-axial collection. No evidence of acute cortical infarct. Brain parenchyma and CSF-containing spaces are normal for age. Vascular: No hyperdense vessel or unexpected calcification. Skull: Normal visualized skull base, calvarium and extracranial soft tissues. Sinuses/Orbits: No sinus fluid levels or advanced mucosal thickening. No mastoid effusion. Normal orbits. IMPRESSION: Normal head CT. Electronically Signed   By: Deatra Robinson M.D.   On: 12/21/2017 14:45   Mr Laqueta Jean And Wo Contrast  Result Date: 12/20/2017 CLINICAL DATA:  57 y/o M; history of multiple sclerosis with flare of symptoms for 3 weeks. Pain in arms, legs, and headache. EXAM: MRI HEAD WITHOUT AND WITH CONTRAST TECHNIQUE: Multiplanar, multiecho pulse sequences of the brain and surrounding structures were  obtained without and with intravenous contrast. CONTRAST:  16mL MULTIHANCE GADOBENATE DIMEGLUMINE 529 MG/ML IV SOLN COMPARISON:  08/06/2017 MRI of the head. FINDINGS: Brain: Numerous stable small T2 FLAIR hyperintense lesions are present within periventricular and subcortical white matter of the supratentorial brain with at least 1 juxta cortical lesion in the right lateral frontal lobe (series 7, image 177). Stable lesions are present within the right brachium pontis and right cerebral peduncle (series 7, image 124). No reduced diffusion or abnormal enhancement. No abnormal susceptibility hypointensity. No hydrocephalus, extra-axial collection, or effacement of basilar cisterns. Vascular: Normal flow voids. Skull and upper cervical spine: Normal marrow signal. Sinuses/Orbits: Negative. Other: None. IMPRESSION: Stable white matter lesions in juxta cortical, subcortical, and posterior fossa white matter compatible with sequelae of demyelination and multiple sclerosis. No enhancement or reduced diffusion to suggest an active process. Electronically Signed   By: Mitzi Hansen M.D.   On: 12/20/2017 04:20   Mr Cervical Spine W Or Wo Contrast  Result Date: 12/20/2017 CLINICAL DATA:  57 y/o M; history of multiple sclerosis with flare up over the last 3 weeks. EXAM: MRI CERVICAL SPINE WITHOUT AND WITH CONTRAST TECHNIQUE: Multiplanar and multiecho pulse sequences of the cervical spine, to include the craniocervical junction and cervicothoracic junction, were obtained without and with intravenous contrast. CONTRAST:  16mL MULTIHANCE GADOBENATE DIMEGLUMINE 529 MG/ML IV SOLN COMPARISON:  08/06/2017 MRI of the cervical spine. FINDINGS: Severe motion degradation. Alignment: Straightening of cervical lordosis without listhesis. Vertebrae: No fracture, evidence of discitis, or bone lesion. Cord: Dorsal C2-3, left dorsal C4-5, right dorsal C6, ventral C6-7 are faintly visualized and grossly stable from prior MRI. No  new cord lesion or enhancement identified. Posterior Fossa, vertebral arteries, paraspinal tissues: Negative. Disc levels: Upper cervical facet hypertrophy and discogenic degenerative changes from C3 through C6 levels are stable. Mild C3-4 and C5-6 canal stenosis. Multilevel foraminal stenosis greatest at C3-4 level  where it is moderate to severe. No significant change and cervical spondylosis from prior study. IMPRESSION: 1. Severe motion degradation. 2. Multiple cervical cord lesions are grossly stable from prior cervical MRI. No definite new lesion or enhancement. 3. Stable cervical spondylosis with mild C3-4 and C5-6 canal stenosis. Electronically Signed   By: Mitzi Hansen M.D.   On: 12/20/2017 04:28    ASSESSMENT: Ocrevus infusion for multiple sclerosis.  PLAN:    1.  Ocrevus infusion for multiple sclerosis: Tysabri was reportedly discontinued while undergoing treatment for hepatitis C.  Proceed with 300 mg Ocrevus today. Patient will return to clinic in 2 weeks for a second infusion. At that point, he can be switched to 600 mg every 6 months. Patient understands that all of his laboratory work, including JC virus, will be monitored by his primary neurologist. He also understands that if he has any questions regarding Ocrevus or his multiple sclerosis but they should be directed towards Dr. Tinnie Gens. Return to clinic in 2 weeks for his next infusion and then 6 months for further evaluation and continuation of treatment.  Approximately 30 minutes was spent in discussion of which greater than 50% was consultation.  Patient expressed understanding and was in agreement with this plan. He also understands that He can call clinic at any time with any questions, concerns, or complaints.    Jeralyn Ruths, MD   01/09/2018 10:17 AM

## 2018-01-11 ENCOUNTER — Ambulatory Visit (INDEPENDENT_AMBULATORY_CARE_PROVIDER_SITE_OTHER): Payer: 59

## 2018-01-11 ENCOUNTER — Encounter (INDEPENDENT_AMBULATORY_CARE_PROVIDER_SITE_OTHER): Payer: Self-pay | Admitting: Orthopaedic Surgery

## 2018-01-11 ENCOUNTER — Ambulatory Visit (INDEPENDENT_AMBULATORY_CARE_PROVIDER_SITE_OTHER): Payer: 59 | Admitting: Orthopaedic Surgery

## 2018-01-11 DIAGNOSIS — M25561 Pain in right knee: Secondary | ICD-10-CM | POA: Diagnosis not present

## 2018-01-11 DIAGNOSIS — G8929 Other chronic pain: Secondary | ICD-10-CM

## 2018-01-11 DIAGNOSIS — M25562 Pain in left knee: Secondary | ICD-10-CM

## 2018-01-11 MED ORDER — METHYLPREDNISOLONE ACETATE 40 MG/ML IJ SUSP
13.3300 mg | INTRAMUSCULAR | Status: AC | PRN
  Administered 2018-01-11: 13.33 mg via INTRA_ARTICULAR

## 2018-01-11 MED ORDER — BUPIVACAINE HCL 0.25 % IJ SOLN
0.6600 mL | INTRAMUSCULAR | Status: AC | PRN
  Administered 2018-01-11: .66 mL via INTRA_ARTICULAR

## 2018-01-11 MED ORDER — LIDOCAINE HCL 1 % IJ SOLN
3.0000 mL | INTRAMUSCULAR | Status: AC | PRN
  Administered 2018-01-11: 3 mL

## 2018-01-11 NOTE — Progress Notes (Signed)
Office Visit Note   Patient: [name]           Date of Birth: [birth year]           MRN: [id number] Visit Date: 01/11/2018              Requested by: Fleet Contras, MD [address] [place], [place] [address] PCP: Fleet Contras, MD   Assessment & Plan: Visit Diagnoses:  1. Chronic pain of both knees     Plan: Because he had such good relief of pain with the previous cortisone injections nearly 5 years ago, we will repeat them today.  He is encouraged to rest ice and elevate as much possible today and tomorrow.  Follow-up with [place] on an as-needed basis.  He will call with concerns or questions in the meantime.  Follow-Up Instructions: Return if symptoms worsen or fail to improve.   Orders:  Orders Placed This Encounter  Procedures  . Large Joint Inj  . XR KNEE 3 VIEW LEFT  . XR KNEE 3 VIEW RIGHT   No orders of the defined types were placed in this encounter.     Procedures: Large Joint Inj: bilateral knee on 01/11/2018 1:31 PM Indications: pain Details: 22 G needle, anterolateral approach Medications (Right): 0.66 mL bupivacaine 0.25 %; 3 mL lidocaine 1 %; 13.33 mg methylPREDNISolone acetate 40 MG/ML Medications (Left): 0.66 mL bupivacaine 0.25 %; 3 mL lidocaine 1 %; 13.33 mg methylPREDNISolone acetate 40 MG/ML      Clinical Data: No additional findings.   Subjective: Chief Complaint  Patient presents with  . Left Knee - Pain  . Right Knee - Pain    HPI [name] is a pleasant 57 year old gentleman who presents to our clinic today with recurrent bilateral knee pain both equally as bad.  This is been ongoing for the past several years.  We saw him back in 2014 will be proceeded with bilateral cortisone injections.  These helped significantly up until recently.  No new injury or change in activity.  Of note he is wheelchair-bound due to multiple sclerosis.  He has been taking narcotic pain medication without much relief of symptoms.  Review of Systems as  detailed in HPI.  All others reviewed and are negative.   Objective: Vital Signs: There were no vitals taken for this visit.  Physical Exam well-developed well-nourished gentleman in no acute distress.  Alert and oriented x3.  Ortho Exam examination of both knees reveals no effusion.  Range of motion 0-110 degrees.  Minimal joint line tenderness.  Ligaments are stable.  Decreased strength throughout.  Specialty Comments:  No specialty comments available.  Imaging: Xr Knee 3 View Left  Result Date: 01/11/2018 X-rays of the left knee reveal minimal joint space narrowing throughout  Xr Knee 3 View Right  Result Date: 01/11/2018 X-rays of the right knee reveal minimal joint space narrowing throughout    PMFS History: Patient Active Problem List   Diagnosis Date Noted  . Severe major depression, single episode, with psychotic features (HCC) 10/04/2017  . Weakness   . Cellulitis of right leg 05/07/2017  . Right carpal tunnel syndrome 02/16/2017  . Gait disturbance 01/22/2017  . Insomnia 01/22/2017  . Depression with anxiety 01/22/2017  . High risk medication use 01/22/2017  . Major depressive disorder, recurrent episode (HCC) 12/19/2016  . Suicidal ideation   . AKI (acute kidney injury) (HCC) 11/15/2016  . Chronic hepatitis C virus infection (HCC) 11/15/2016  . Chronic abdominal pain 11/15/2016  . Chronic  pain syndrome 11/15/2016  . Acute retention of urine 11/15/2016  . Generalized abdominal pain   . Chronic cluster headache, not intractable   . Community acquired pneumonia   . TB lung, latent   . HCAP (healthcare-associated pneumonia) 02/16/2016  . Hypokalemia 02/16/2016  . Intractable cluster headache syndrome   . DVT (deep venous thrombosis) (HCC)   . Depression   . GERD (gastroesophageal reflux disease)   . HTN (hypertension)   . Essential hypertension   . Gastroesophageal reflux disease without esophagitis   . CAP (community acquired pneumonia)   . Multiple  sclerosis (HCC) 08/02/2013  . ABDOMINAL BLOATING 10/14/2010  . LOOSE STOOLS 10/14/2010  . PULMONARY EMBOLISM, HX OF 10/14/2010   Past Medical History:  Diagnosis Date  . Abdominal pain, unspecified site   . Anxiety   . Arthritis   . Benign paroxysmal positional vertigo   . Cellulitis and abscess of right leg 04/2017  . Chronic back pain   . Chronic pain syndrome 01/25/2008  . Cluster headache   . Depression    takes Zoloft daily  . DVT (deep venous thrombosis) (HCC)    in the left arm '09  . Gait abnormality    "uses mobile wheelchair, but is ambulatory"  . Gallstones 02/17/2009   resolved after gallbladder surgery.  [name] GERD (gastroesophageal reflux disease)    takes Omeprazole as needed  . Headache(784.0)    cluster headaches frequently-takes Topamax daily  . History of colonoscopy   . HTN (hypertension)    takes Lisinopril,Verapamil,and Triamterene HCTZ daily  . Insomnia 11/06/2008  . Joint pain   . Joint swelling    03-07-16 "swelling of right wrist" "after a fall-xray done 03-06-16 "no fractures".  . Memory loss    no an issue at present 03-07-16  . Multiple sclerosis (HCC)    Dx. 2005 - Dr. Tinnie Gens follows LOV 4'17 tx. Tysabri monthly IV-[facility] , [place].  [name] Nonspecific elevation of levels of transaminase or lactic acid dehydrogenase (LDH)   . Other specified visual disturbances   . Other syndromes affecting cervical region   . Pneumonia 2009  . Trigeminal neuralgia     history" Multiple sclerosis"    Family History  Problem Relation Age of Onset  . Cancer Father   . Diabetes Mother     Past Surgical History:  Procedure Laterality Date  . CHOLECYSTECTOMY  02/20/2009  . COLONOSCOPY WITH PROPOFOL N/A 03/17/2016   Procedure: COLONOSCOPY WITH PROPOFOL;  Surgeon: [name], MD;  Location: [facility] END[facility]COPY;  Service: Endoscopy;  Laterality: N/A;  . PORT A CATH REVISION N/A 07/06/2015   Procedure: Removal and replacement of PORT A CATH;  Surgeon:  [name], MD;  Location: [facility] OR;  Service: General;  Laterality: N/A;  . PORTACATH PLACEMENT N/A 03/27/2014   Procedure: INSERTION PORT-A-CATH;  Surgeon: [name], MD;  Location: [facility] OR;  Service: General;  Laterality: N/A;   Social History   Occupational History    Comment: Disabled  Tobacco Use  . Smoking status: Former Smoker    Packs/day: 1.00    Years: 38.00    Pack years: 38.00    Types: Cigarettes  . Smokeless tobacco: Never Used  . Tobacco comment: cutting back  Substance and Sexual Activity  . Alcohol use: Yes    Alcohol/week: 0.0 oz    Comment: occasional  . Drug use: No    Comment: Quit 2011  . Sexual activity: Not on file

## 2018-01-15 ENCOUNTER — Encounter: Payer: Self-pay | Admitting: Oncology

## 2018-01-15 ENCOUNTER — Inpatient Hospital Stay: Payer: 59 | Attending: Oncology | Admitting: Oncology

## 2018-01-15 ENCOUNTER — Inpatient Hospital Stay: Payer: 59 | Attending: Oncology

## 2018-01-15 ENCOUNTER — Ambulatory Visit: Payer: 59 | Admitting: Oncology

## 2018-01-15 VITALS — BP 110/69 | HR 73 | Temp 97.6°F | Resp 20

## 2018-01-15 VITALS — BP 119/74 | HR 74 | Temp 98.2°F | Resp 20

## 2018-01-15 DIAGNOSIS — F329 Major depressive disorder, single episode, unspecified: Secondary | ICD-10-CM | POA: Insufficient documentation

## 2018-01-15 DIAGNOSIS — B1920 Unspecified viral hepatitis C without hepatic coma: Secondary | ICD-10-CM | POA: Insufficient documentation

## 2018-01-15 DIAGNOSIS — Z79899 Other long term (current) drug therapy: Secondary | ICD-10-CM

## 2018-01-15 DIAGNOSIS — R51 Headache: Secondary | ICD-10-CM | POA: Diagnosis not present

## 2018-01-15 DIAGNOSIS — G35 Multiple sclerosis: Secondary | ICD-10-CM | POA: Insufficient documentation

## 2018-01-15 DIAGNOSIS — G894 Chronic pain syndrome: Secondary | ICD-10-CM | POA: Insufficient documentation

## 2018-01-15 DIAGNOSIS — Z87891 Personal history of nicotine dependence: Secondary | ICD-10-CM | POA: Insufficient documentation

## 2018-01-15 DIAGNOSIS — I10 Essential (primary) hypertension: Secondary | ICD-10-CM | POA: Diagnosis not present

## 2018-01-15 DIAGNOSIS — M47812 Spondylosis without myelopathy or radiculopathy, cervical region: Secondary | ICD-10-CM | POA: Insufficient documentation

## 2018-01-15 MED ORDER — SODIUM CHLORIDE 0.9 % IV SOLN
600.0000 mg | Freq: Once | INTRAVENOUS | Status: AC
  Administered 2018-01-15: 600 mg via INTRAVENOUS
  Filled 2018-01-15 (×2): qty 20

## 2018-01-15 MED ORDER — HEPARIN SOD (PORK) LOCK FLUSH 100 UNIT/ML IV SOLN
500.0000 [IU] | Freq: Once | INTRAVENOUS | Status: AC
  Administered 2018-01-15: 500 [IU] via INTRAVENOUS

## 2018-01-15 MED ORDER — ACETAMINOPHEN 500 MG PO TABS
1000.0000 mg | ORAL_TABLET | Freq: Once | ORAL | Status: AC
  Administered 2018-01-15: 1000 mg via ORAL
  Filled 2018-01-15: qty 2

## 2018-01-15 MED ORDER — DIPHENHYDRAMINE HCL 50 MG/ML IJ SOLN
25.0000 mg | Freq: Once | INTRAMUSCULAR | Status: AC
  Administered 2018-01-15: 25 mg via INTRAVENOUS
  Filled 2018-01-15: qty 1

## 2018-01-15 MED ORDER — SODIUM CHLORIDE 0.9% FLUSH
10.0000 mL | INTRAVENOUS | Status: DC | PRN
  Administered 2018-01-15: 10 mL via INTRAVENOUS
  Filled 2018-01-15: qty 10

## 2018-01-15 MED ORDER — METHYLPREDNISOLONE SODIUM SUCC 125 MG IJ SOLR
125.0000 mg | Freq: Once | INTRAMUSCULAR | Status: AC
  Administered 2018-01-15: 125 mg via INTRAVENOUS
  Filled 2018-01-15: qty 2

## 2018-01-15 NOTE — Progress Notes (Signed)
[facility]  Telephone:([phone] [phone]  ID: [name] OB: 12-16-1960  MR#: [id number]  AOZ#:[id number]  Patient Care Team: Fleet Contras, MD as PCP - General (Internal Medicine) Christoper Allegra., MD as Referring Physician (Neurology)  CHIEF COMPLAINT:  Ocrevus infusions for MS  INTERVAL HISTORY: Patient returns to clinic today for further evaluation and continuation of Ocrevus. Currently, he feels well and is at his baseline.  He reports he does have increased symptoms in the weeks leading up to his infusion.  Currently, he has no new neurologic complaints.  He denies any recent fevers.  He has a good appetite and denies weight loss.  He has no chest pain or shortness of breath.  He denies any nausea, vomiting, constipation, or diarrhea.  He has no urinary complaints.  Patient offers no further specific complaints today.  REVIEW OF SYSTEMS:   Review of Systems  Constitutional: Negative.  Negative for fever, malaise/fatigue and weight loss.  Respiratory: Negative.  Negative for cough and shortness of breath.   Cardiovascular: Negative.  Negative for chest pain and leg swelling.  Gastrointestinal: Negative.  Negative for nausea and vomiting.  Genitourinary: Negative.   Musculoskeletal: Negative.   Skin: Negative.  Negative for rash.  Neurological: Positive for focal weakness. Negative for sensory change and weakness.  Psychiatric/Behavioral: Negative.  The patient is not nervous/anxious.     As per HPI. Otherwise, a complete review of systems is negative.  PAST MEDICAL HISTORY: Past Medical History:  Diagnosis Date  . Abdominal pain, unspecified site   . Anxiety   . Arthritis   . Benign paroxysmal positional vertigo   . Cellulitis and abscess of right leg 04/2017  . Chronic back pain   . Chronic pain syndrome 01/25/2008  . Cluster headache   . Depression    takes Zoloft daily  . DVT (deep venous thrombosis) (HCC)    in the left arm  '09  . Gait abnormality    "uses mobile wheelchair, but is ambulatory"  . Gallstones 02/17/2009   resolved after gallbladder surgery.  [name] GERD (gastroesophageal reflux disease)    takes Omeprazole as needed  . Headache(784.0)    cluster headaches frequently-takes Topamax daily  . History of colonoscopy   . HTN (hypertension)    takes Lisinopril,Verapamil,and Triamterene HCTZ daily  . Insomnia 11/06/2008  . Joint pain   . Joint swelling    03-07-16 "swelling of right wrist" "after a fall-xray done 03-06-16 "no fractures".  . Memory loss    no an issue at present 03-07-16  . Multiple sclerosis (HCC)    Dx. 2005 - Dr. Tinnie Gens follows LOV 4'17 tx. Tysabri monthly IV-[facility] , [place].  [name] Nonspecific elevation of levels of transaminase or lactic acid dehydrogenase (LDH)   . Other specified visual disturbances   . Other syndromes affecting cervical region   . Pneumonia 2009  . Trigeminal neuralgia     history" Multiple sclerosis"    PAST SURGICAL HISTORY: Past Surgical History:  Procedure Laterality Date  . CHOLECYSTECTOMY  02/20/2009  . COLONOSCOPY WITH PROPOFOL N/A 03/17/2016   Procedure: COLONOSCOPY WITH PROPOFOL;  Surgeon: [name], MD;  Location: [facility];  Service: Endoscopy;  Laterality: N/A;  . PORT A CATH REVISION N/A 07/06/2015   Procedure: Removal and replacement of PORT A CATH;  Surgeon: [name], MD;  Location: [facility] OR;  Service: General;  Laterality: N/A;  . PORTACATH PLACEMENT N/A 03/27/2014   Procedure: INSERTION PORT-A-CATH;  Surgeon: Angelia Mould  Derrell Lolling, MD;  Location: [facility] OR;  Service: General;  Laterality: N/A;    FAMILY HISTORY Family History  Problem Relation Age of Onset  . Cancer Father   . Diabetes Mother        ADVANCED DIRECTIVES:    HEALTH MAINTENANCE: Social History   Tobacco Use  . Smoking status: Former Smoker    Packs/day: 1.00    Years: 38.00    Pack years: 38.00    Types: Cigarettes  . Smokeless tobacco: Never  Used  . Tobacco comment: cutting back  Substance Use Topics  . Alcohol use: Yes    Alcohol/week: 0.0 oz    Comment: occasional  . Drug use: No    Comment: Quit 2011     Colonoscopy:  PAP:  Bone density:  Lipid panel:  No Known Allergies  Current Outpatient Medications  Medication Sig Dispense Refill  . ARIPiprazole (ABILIFY) 10 MG tablet Take 1 tablet (10 mg total) by mouth at bedtime. For mood control 30 tablet 0  . baclofen (LIORESAL) 20 MG tablet Take 1 tablet (20 mg total) by mouth 4 (four) times daily. For muscle spasms (Patient taking differently: Take 20 mg by mouth 3 (three) times daily. 10a, 2pm, 10p - For muscle spasms) 30 each 0  . clotrimazole-betamethasone (LOTRISONE) cream Apply 1 application topically 2 (two) times daily as needed (itching/rash).   2  . dalfampridine 10 MG TB12 Take 1 tablet (10 mg total) by mouth 2 (two) times daily. For MS (Patient taking differently: Take 10 mg by mouth 2 (two) times daily. "Ampyra" 10a and 10p -For MS) 10 tablet 0  . dicyclomine (BENTYL) 20 MG tablet Take 1 tablet (20 mg total) by mouth every 6 (six) hours as needed for spasms. 16 tablet 0  . gabapentin (NEURONTIN) 600 MG tablet Take 600 mg by mouth 4 (four) times daily. 10am, 2pm, 6pm, 10pm  2  . hydrochlorothiazide (HYDRODIURIL) 25 MG tablet Take 25 mg by mouth daily.    [name] levETIRAcetam (KEPPRA) 500 MG tablet Take 500 mg by mouth 2 (two) times daily.    [name] lisinopril-hydrochlorothiazide (PRINZIDE,ZESTORETIC) 10-12.5 MG tablet Take 1 tablet by mouth daily.    . naloxone (NARCAN) nasal spray 4 mg/0.1 mL Place 1 spray into the nose once as needed (opiod overdose).    Fran Lowes (OCREVUS IV) Inject into the vein every 6 (six) months.    [name] omeprazole (PRILOSEC) 20 MG capsule Take 20 mg by mouth 2 (two) times daily as needed (acid reflux/ indigestion).   1  . omeprazole (PRILOSEC) 40 MG capsule Take 1 capsule (40 mg total) by mouth every evening. For acid reflux (Patient taking  differently: Take 40 mg by mouth 2 (two) times daily as needed (acid reflux - indigestion). ) 10 capsule 0  . Oxcarbazepine (TRILEPTAL) 300 MG tablet Take 1 tablet (300 mg total) by mouth 3 (three) times daily. For mood stabilization 90 tablet 0  . oxyCODONE-acetaminophen (PERCOCET) 10-325 MG tablet Take 1 tablet by mouth every 4 (four) hours as needed for pain. (Patient taking differently: Take 1 tablet by mouth every 4 (four) hours. ) 1 tablet 0  . sertraline (ZOLOFT) 50 MG tablet Take 1 tablet (50 mg total) by mouth daily. For depression 30 tablet 0  . topiramate (TOPAMAX) 25 MG tablet Take 25 mg by mouth every evening. 4pm  1  . topiramate (TOPAMAX) 50 MG tablet Take 50 mg by mouth 2 (two) times daily. 10a and 10p  1  .  traZODone (DESYREL) 100 MG tablet Take 100 mg by mouth at bedtime as needed for sleep.     [name] triamterene-hydrochlorothiazide (DYAZIDE) 37.5-25 MG capsule Take 1 each (1 capsule total) by mouth daily. For high blood pressure 10 capsule 0  . verapamil (CALAN) 80 MG tablet Take 1 tablet (80 mg total) by mouth 2 (two) times daily. For high blood pressure (Patient taking differently: Take 80 mg by mouth See admin instructions. Take one tablet (80 mg) by mouth twice daily - 2pm and 10pm. For high blood pressure) 20 tablet 0  . XARELTO 10 MG TABS tablet Take 1 tablet (10 mg total) by mouth daily. For blood clot prevention (Patient taking differently: Take 10 mg by mouth every evening. For blood clot prevention) 10 tablet 0  . gabapentin (NEURONTIN) 800 MG tablet Take 0.5 tablets (400 mg total) by mouth 3 (three) times daily. For agitation/pain (Patient not taking: Reported on 12/19/2017) 90 tablet 0  . hydrOXYzine (ATARAX/VISTARIL) 25 MG tablet Take 1 tablet (25 mg total) by mouth every 6 (six) hours as needed for anxiety. (Patient not taking: Reported on 12/19/2017) 60 tablet 0  . nortriptyline (PAMELOR) 50 MG capsule Take 1 capsule (50 mg total) by mouth at bedtime. For depression/sleep  (Patient not taking: Reported on 12/19/2017) 30 capsule 0  . potassium chloride SA (KLOR-CON M20) 20 MEQ tablet Take 1 tablet (20 mEq total) by mouth daily. For potassium replacement (Patient not taking: Reported on 12/19/2017) 10 tablet 0  . topiramate (TOPAMAX) 100 MG tablet Take 1 tablet (100 mg total) by mouth at bedtime. For mood stabilization (Patient not taking: Reported on 12/19/2017) 30 tablet 0   No current facility-administered medications for this visit.    Facility-Administered Medications Ordered in Other Visits  Medication Dose Route Frequency Provider Last Rate Last Dose  . acetaminophen (TYLENOL) tablet 1,000 mg  1,000 mg Oral Once Jeralyn Ruths, MD      . diphenhydrAMINE (BENADRYL) injection 25 mg  25 mg Intravenous Once Jeralyn Ruths, MD      . methylPREDNISolone sodium succinate (SOLU-MEDROL) 125 mg/2 mL injection 125 mg  125 mg Intravenous Once Jeralyn Ruths, MD      . ocrelizumab (OCREVUS) 600 mg in sodium chloride 0.9 % 500 mL  600 mg Intravenous Once Jeralyn Ruths, MD      . sodium chloride 0.9 % injection 10 mL  10 mL Intracatheter PRN Loann Quill, NP   10 mL at 11/07/15 1012    OBJECTIVE: Vitals:   01/15/18 0941  BP: 119/74  Pulse: 74  Resp: 20  Temp: 98.2 F (36.8 C)     There is no height or weight on file to calculate BMI.    ECOG FS:1 - Symptomatic but completely ambulatory  General: Well-developed, well-nourished, no acute distress.  Patient uses a motorized wheelchair. Eyes: anicteric sclera. Lungs: Clear to auscultation bilaterally. Heart: Regular rate and rhythm. No rubs, murmurs, or gallops. Abdomen: Soft, nontender, nondistended. No organomegaly noted, normoactive bowel sounds. Musculoskeletal: No edema, cyanosis, or clubbing. Neuro: Alert, answering all questions appropriately. Cranial nerves grossly intact. Skin: No rashes or petechiae noted. Psych: Normal affect.   LAB RESULTS:  Lab Results  Component Value Date    NA 138 12/19/2017   K 4.6 12/19/2017   CL 109 12/19/2017   CO2 20 (L) 12/19/2017   GLUCOSE 152 (H) 12/19/2017   BUN 15 12/19/2017   CREATININE 0.87 12/19/2017   CALCIUM 8.4 (L) 12/19/2017  PROT 5.8 (L) 12/19/2017   ALBUMIN 3.2 (L) 12/19/2017   AST 27 12/19/2017   ALT 89 (H) 12/19/2017   ALKPHOS 90 12/19/2017   BILITOT 0.6 12/19/2017   GFRNONAA >60 12/19/2017   GFRAA >60 12/19/2017    Lab Results  Component Value Date   WBC 15.9 (H) 12/19/2017   NEUTROABS 15.0 (H) 12/19/2017   HGB 13.5 12/19/2017   HCT 41.8 12/19/2017   MCV 84.4 12/19/2017   PLT 174 12/19/2017     STUDIES: Dg Chest 2 View  Result Date: 12/19/2017 CLINICAL DATA:  Pt presents to the ed for a MS flare up x 3 weeks states that he went to his doctor who told him that if he wasn't feeling better with the oral steroids they gave him then he needed to come to the ED. Complaining of pain in his arms, legs and a headache. EXAM: CHEST  2 VIEW COMPARISON:  10/03/2017 FINDINGS: Cardiac silhouette is normal in size. No mediastinal or hilar masses. No convincing adenopathy. Clear lungs. No pleural effusion or pneumothorax. Right anterior chest wall, internal jugular, Port-A-Cath has its tip at the caval atrial junction, unchanged. Skeletal structures are intact. IMPRESSION: No active cardiopulmonary disease. Electronically Signed   By: Amie Portland M.D.   On: 12/19/2017 23:39   Ct Head Wo Contrast  Result Date: 12/21/2017 CLINICAL DATA:  Fall EXAM: CT HEAD WITHOUT CONTRAST TECHNIQUE: Contiguous axial images were obtained from the base of the skull through the vertex without intravenous contrast. COMPARISON:  Head CT 05/07/2017 FINDINGS: Brain: No mass lesion, intraparenchymal hemorrhage or extra-axial collection. No evidence of acute cortical infarct. Brain parenchyma and CSF-containing spaces are normal for age. Vascular: No hyperdense vessel or unexpected calcification. Skull: Normal visualized skull base, calvarium and  extracranial soft tissues. Sinuses/Orbits: No sinus fluid levels or advanced mucosal thickening. No mastoid effusion. Normal orbits. IMPRESSION: Normal head CT. Electronically Signed   By: Deatra Robinson M.D.   On: 12/21/2017 14:45   Mr Laqueta Jean And Wo Contrast  Result Date: 12/20/2017 CLINICAL DATA:  57 y/o M; history of multiple sclerosis with flare of symptoms for 3 weeks. Pain in arms, legs, and headache. EXAM: MRI HEAD WITHOUT AND WITH CONTRAST TECHNIQUE: Multiplanar, multiecho pulse sequences of the brain and surrounding structures were obtained without and with intravenous contrast. CONTRAST:  16mL MULTIHANCE GADOBENATE DIMEGLUMINE 529 MG/ML IV SOLN COMPARISON:  08/06/2017 MRI of the head. FINDINGS: Brain: Numerous stable small T2 FLAIR hyperintense lesions are present within periventricular and subcortical white matter of the supratentorial brain with at least 1 juxta cortical lesion in the right lateral frontal lobe (series 7, image 177). Stable lesions are present within the right brachium pontis and right cerebral peduncle (series 7, image 124). No reduced diffusion or abnormal enhancement. No abnormal susceptibility hypointensity. No hydrocephalus, extra-axial collection, or effacement of basilar cisterns. Vascular: Normal flow voids. Skull and upper cervical spine: Normal marrow signal. Sinuses/Orbits: Negative. Other: None. IMPRESSION: Stable white matter lesions in juxta cortical, subcortical, and posterior fossa white matter compatible with sequelae of demyelination and multiple sclerosis. No enhancement or reduced diffusion to suggest an active process. Electronically Signed   By: Mitzi Hansen M.D.   On: 12/20/2017 04:20   Mr Cervical Spine W Or Wo Contrast  Result Date: 12/20/2017 CLINICAL DATA:  57 y/o M; history of multiple sclerosis with flare up over the last 3 weeks. EXAM: MRI CERVICAL SPINE WITHOUT AND WITH CONTRAST TECHNIQUE: Multiplanar and multiecho pulse sequences of the  cervical  spine, to include the craniocervical junction and cervicothoracic junction, were obtained without and with intravenous contrast. CONTRAST:  55mL MULTIHANCE GADOBENATE DIMEGLUMINE 529 MG/ML IV SOLN COMPARISON:  08/06/2017 MRI of the cervical spine. FINDINGS: Severe motion degradation. Alignment: Straightening of cervical lordosis without listhesis. Vertebrae: No fracture, evidence of discitis, or bone lesion. Cord: Dorsal C2-3, left dorsal C4-5, right dorsal C6, ventral C6-7 are faintly visualized and grossly stable from prior MRI. No new cord lesion or enhancement identified. Posterior Fossa, vertebral arteries, paraspinal tissues: Negative. Disc levels: Upper cervical facet hypertrophy and discogenic degenerative changes from C3 through C6 levels are stable. Mild C3-4 and C5-6 canal stenosis. Multilevel foraminal stenosis greatest at C3-4 level where it is moderate to severe. No significant change and cervical spondylosis from prior study. IMPRESSION: 1. Severe motion degradation. 2. Multiple cervical cord lesions are grossly stable from prior cervical MRI. No definite new lesion or enhancement. 3. Stable cervical spondylosis with mild C3-4 and C5-6 canal stenosis. Electronically Signed   By: Mitzi Hansen M.D.   On: 12/20/2017 04:28   Xr Knee 3 View Left  Result Date: 01/11/2018 X-rays of the left knee reveal minimal joint space narrowing throughout  Xr Knee 3 View Right  Result Date: 01/11/2018 X-rays of the right knee reveal minimal joint space narrowing throughout   ASSESSMENT: Ocrevus infusion for multiple sclerosis.  PLAN:    1.  Ocrevus infusion for multiple sclerosis: Tysabri was reportedly discontinued while undergoing treatment for hepatitis C.  Proceed with 600 mg Ocrevus today.  Patient understands that all of his laboratory work, including JC virus, will be monitored by his primary neurologist. He also understands that if he has any questions regarding Ocrevus or his  multiple sclerosis but they should be directed towards Dr. Tinnie Gens. Return to clinic in 6 months for further evaluation and continuation of treatment.  Approximately 30 minutes was spent in discussion of which greater than 50% was consultation.  Patient expressed understanding and was in agreement with this plan. He also understands that He can call clinic at any time with any questions, concerns, or complaints.    Jeralyn Ruths, MD   01/15/2018 9:59 AM

## 2018-01-15 NOTE — Progress Notes (Signed)
Patient denies any concerns today.  

## 2018-02-09 ENCOUNTER — Ambulatory Visit: Payer: 59 | Attending: Psychiatry | Admitting: Speech Pathology

## 2018-02-09 ENCOUNTER — Other Ambulatory Visit: Payer: Self-pay

## 2018-02-09 DIAGNOSIS — R482 Apraxia: Secondary | ICD-10-CM | POA: Insufficient documentation

## 2018-02-09 NOTE — Patient Instructions (Signed)
   ABDOMINAL BREATHING   . Shoulders down - this is a cue to relax . Place your hand on your abdomen - this helps you focus on easy abdominal breath support - the best and most relaxed way to breathe . Breathe in through your nose and fill your belly with air, watching your hand move outward . Breathe out through your mouth and watch your belly move in. An audible "sh"  may help   Think of your belly as a balloon, when you fill with air (inhale), the balloon gets bigger. As the air goes out (exhale), the balloon deflates.  If you are having difficulty coordinating this, lay on your back with a plastic cup on your belly and repeat the above steps, watching you belly move up with inhalation and down with exhalations  Practice breathing in and out in front of a mirror, watching your belly Breathe in for a count of 5 and breathe out for a count of 5  When you feel a stutter come on take a breath and prolong the sound  After breathing practice, use the word sheet to practice prolonging 1st sound of word 3x   Read aloud slowly prolonging sounds  When you get into a stutter, practice slowing and prolong the sound       There's an App for that: Breathe2relax  Provided by: Clinton Sawyer. SLP [phone]

## 2018-02-09 NOTE — Therapy (Addendum)
[facility] [address] [place], [place], [address] Phone: [phone]   Fax:  [phone]  Speech Language Pathology Evaluation  Patient Details  Name: [name] MRN: [id number] Date of Birth: Dec 12, 1960 Referring Provider: Dr. Myrtis Ser   Encounter Date: 02/09/2018    Past Medical History:  Diagnosis Date  . Abdominal pain, unspecified site   . Anxiety   . Arthritis   . Benign paroxysmal positional vertigo   . Cellulitis and abscess of right leg 04/2017  . Chronic back pain   . Chronic pain syndrome 01/25/2008  . Cluster headache   . Depression    takes Zoloft daily  . DVT (deep venous thrombosis) (HCC)    in the left arm '09  . Gait abnormality    "uses mobile wheelchair, but is ambulatory"  . Gallstones 02/17/2009   resolved after gallbladder surgery.  [name] GERD (gastroesophageal reflux disease)    takes Omeprazole as needed  . Headache(784.0)    cluster headaches frequently-takes Topamax daily  . History of colonoscopy   . HTN (hypertension)    takes Lisinopril,Verapamil,and Triamterene HCTZ daily  . Insomnia 11/06/2008  . Joint pain   . Joint swelling    03-07-16 "swelling of right wrist" "after a fall-xray done 03-06-16 "no fractures".  . Memory loss    no an issue at present 03-07-16  . Multiple sclerosis (HCC)    Dx. 2005 - Dr. Tinnie Gens follows LOV 4'17 tx. Tysabri monthly IV-[facility] , [place].  [name] Nonspecific elevation of levels of transaminase or lactic acid dehydrogenase (LDH)   . Other specified visual disturbances   . Other syndromes affecting cervical region   . Pneumonia 2009  . Trigeminal neuralgia     history" Multiple sclerosis"    Past Surgical History:  Procedure Laterality Date  . CHOLECYSTECTOMY  02/20/2009  . COLONOSCOPY WITH PROPOFOL N/A 03/17/2016   Procedure: COLONOSCOPY WITH PROPOFOL;  Surgeon: [name], MD;  Location: [facility];  Service:  Endoscopy;  Laterality: N/A;  . PORT A CATH REVISION N/A 07/06/2015   Procedure: Removal and replacement of PORT A CATH;  Surgeon: [name], MD;  Location: [facility] OR;  Service: General;  Laterality: N/A;  . PORTACATH PLACEMENT N/A 03/27/2014   Procedure: INSERTION PORT-A-CATH;  Surgeon: [name], MD;  Location: [facility] OR;  Service: General;  Laterality: N/A;    There were no vitals filed for this visit.      SLP Evaluation [facility] - 03/16/18 1035      SLP Visit Information   SLP Received On  02/09/18    Referring Provider  Dr. Myrtis Ser    Onset Date  MS dx in 2005; speech decline 11/2017    Medical Diagnosis  Multiple Sclerosis      Subjective   Patient/Family Stated Goal  To get my speech back on track      Pain Assessment   Currently in Pain?  Yes    Pain Score  8     Pain Location  -- generalized    Pain Orientation  Right;Left    Pain Type  Chronic pain    Pain Onset  More than a month ago    Pain Frequency  Constant    Pain Relieving Factors  meds    Effect of Pain on Daily Activities  limits      General Information   HPI  [name] is a 57 y.o. male dx with aggressive relapsing MS in  2005. He started Ocrevus infustion in 06/2017 with improved strength and mobility. Speech difficulty began about 3 months ago.       Balance Screen   Has the patient fallen in the past 6 months  Yes    How many times?  3    Has the patient had a decrease in activity level because of a fear of falling?   Yes    Is the patient reluctant to leave their home because of a fear of falling?   Yes      Prior Functional Status   Cognitive/Linguistic Baseline  Baseline deficits    Baseline deficit details  memory and attention    Type of Home  House     Lives With  Family mother and brother    Available Support  Family    Vocation  On disability      Oral Motor/Sensory Function   Overall Oral Motor/Sensory Function  Impaired    Labial ROM  Within Functional Limits     Labial Coordination  Reduced    Lingual ROM  Within Functional Limits    Lingual Symmetry  Within Functional Limits    Lingual Strength  Within Functional Limits    Lingual Coordination  Reduced    Facial ROM  Within Functional Limits    Velum  Within Functional Limits    Mandible  Within Functional Limits      Motor Speech   Overall Motor Speech  Impaired    Respiration  Within functional limits    Phonation  Normal    Resonance  Within functional limits    Articulation  Within functional limitis    Intelligibility  Intelligibility reduced    Word  75-100% accurate    Phrase  75-100% accurate    Sentence  75-100% accurate    Conversation  75-100% accurate    Motor Planning  Impaired    Level of Impairment  Conversation    Motor Speech Errors  Aware;Groping for words    Effective Techniques  Slow rate;Pause prolong initial sounds                        SLP Short Term Goals - 03/16/18 1033      SLP SHORT TERM GOAL #1   Title  Pt will utilize compensations (reduced rate of speech, gentle onset, prolonging)  for dysfluent speech in oral reading task with occasional min A over 2 sessions    Baseline  Pt is not using compensations for verbal apraxia    Time  4    Period  Weeks    Status  New      SLP SHORT TERM GOAL #2   Title  Pt will utilize compensations for dysfluent speech in structured speech tasks 10/12 sentences    Baseline  Pt is not using compensations for fluent speech    Time  4    Period  Weeks    Status  New       SLP Long Term Goals - 03/16/18 1033      SLP LONG TERM GOAL #1   Title  Pt will produce 2 or less dysfluencies over 15 minute conversation over 2 sessions    Baseline  Pt experiencing greater than 10 dysfluent episodes over 10 minute conversation    Time  8    Period  Weeks    Status  New      SLP LONG TERM GOAL #2  Title  Pt will utilize compensations for dysfluent speech in conversation when needed with rare min A     Baseline  Pt is not utilizing compensations for verbal arpaxia/dysfluency    Time  8    Period  Weeks    Status  New       Plan - 03/16/18 1035    Clinical Impression Statement  [name] is referred by his neurologist for outpt speech therapy due to difficulty speaking. 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 [name] exhibited incoordinated and reduced rapid alternating speech tasks. [name] rate of speech in conversation and oral reading is rapid. He demonstrated inital and medial syllalble repetitions of 3-5 repetitions occasionally in paragraph reading and conversation. Eye gaze and neck flexion noted during stutter. No other secondary behaviors observed.   He reports this dysfluency causes difficulty communicating, especially over the phone. He reports experiencing dysfluency several times daily. I recommend skilled ST to maxmize intelligibility and fluency for improved independence and success communicatng. I initiated training of compensations to promote fluent speech, including rate reduction, prolonging of iniital sounds/syllables in multisyllabic words. [name] required consistent mod to max A to reduce rate and prolong sounds. I recommended 2x a week, however pt elects to come to ST 1x a week    Speech Therapy Frequency  -- 1x a week for 3 weeks, then 1x a week for 6 weeks    Duration  -- 1x a week for 3 weeks, then 1x a week for 6 weeks    Treatment/Interventions  Environmental controls;Cueing hierarchy;SLP instruction and feedback;Compensatory techniques;Functional tasks;Internal/external aids;Patient/family education    Potential to Achieve Goals  Good    Potential Considerations  Medical prognosis    Consulted and Agree with Plan of Care  Patient       Patient will benefit from skilled therapeutic intervention in order to improve the  following deficits and impairments:   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 Severe major depression, single episode, with psychotic features (HCC) 10/04/2017  . Weakness   . Cellulitis of right leg 05/07/2017  . Right carpal tunnel syndrome 02/16/2017  . Gait disturbance 01/22/2017  . Insomnia 01/22/2017  . Depression with anxiety 01/22/2017  . High risk medication use 01/22/2017  . Major depressive disorder, recurrent episode (HCC) 12/19/2016  . Suicidal ideation   . AKI (acute kidney injury) (HCC) 11/15/2016  . Chronic hepatitis C virus infection (HCC) 11/15/2016  . Chronic abdominal pain 11/15/2016  . Chronic pain syndrome 11/15/2016  . Acute retention of urine 11/15/2016  . Generalized abdominal pain   . Chronic cluster headache, not intractable   . Community acquired pneumonia   . TB lung, latent   . HCAP (healthcare-associated pneumonia) 02/16/2016  . Hypokalemia 02/16/2016  . Intractable cluster headache syndrome   . DVT (deep venous thrombosis) (HCC)   . Depression   . GERD (gastroesophageal reflux disease)   . HTN (hypertension)   . Essential hypertension   . Gastroesophageal reflux disease without esophagitis   . CAP (community acquired pneumonia)   . Multiple sclerosis (HCC) 08/02/2013  . ABDOMINAL BLOATING 10/14/2010  . LOOSE STOOLS 10/14/2010  . PULMONARY EMBOLISM, HX OF 10/14/2010    Kenesha Moshier, Radene Journey MS, CCC-SLP 03/16/2018, 10:40 AM  [facility] [address] [place], [place], [address] Phone: [phone]   Fax:  [phone]  Name: [name] MRN: [id number] Date of Birth: Feb 27, 1961

## 2018-03-01 ENCOUNTER — Emergency Department (HOSPITAL_COMMUNITY)
Admission: EM | Admit: 2018-03-01 | Discharge: 2018-03-01 | Disposition: A | Discharge status: Home / Self Care | Payer: 59 | Attending: Emergency Medicine | Admitting: Emergency Medicine

## 2018-03-01 ENCOUNTER — Encounter (HOSPITAL_COMMUNITY): Payer: Self-pay | Admitting: Emergency Medicine

## 2018-03-01 DIAGNOSIS — Z7901 Long term (current) use of anticoagulants: Secondary | ICD-10-CM | POA: Diagnosis not present

## 2018-03-01 DIAGNOSIS — I10 Essential (primary) hypertension: Secondary | ICD-10-CM | POA: Diagnosis not present

## 2018-03-01 DIAGNOSIS — Z79899 Other long term (current) drug therapy: Secondary | ICD-10-CM | POA: Insufficient documentation

## 2018-03-01 DIAGNOSIS — G35 Multiple sclerosis: Secondary | ICD-10-CM | POA: Diagnosis not present

## 2018-03-01 DIAGNOSIS — Z87891 Personal history of nicotine dependence: Secondary | ICD-10-CM | POA: Diagnosis not present

## 2018-03-01 DIAGNOSIS — R070 Pain in throat: Secondary | ICD-10-CM | POA: Diagnosis present

## 2018-03-01 DIAGNOSIS — Z0389 Encounter for observation for other suspected diseases and conditions ruled out: Secondary | ICD-10-CM | POA: Insufficient documentation

## 2018-03-01 DIAGNOSIS — Z711 Person with feared health complaint in whom no diagnosis is made: Secondary | ICD-10-CM

## 2018-03-01 NOTE — ED Triage Notes (Signed)
Per [facility] pt from home for bugs in his throat that he can feel move around for about week.  Reports he had this before and was given antibiotics. Per EMS pt has MS and has unsteady gait.

## 2018-03-01 NOTE — ED Notes (Signed)
Bed: [facility] Expected date:  Expected time:  Means of arrival:  Comments: 

## 2018-03-01 NOTE — ED Provider Notes (Addendum)
[facility]-EMERGENCY DEPT Provider Note   CSN: [id number] Arrival date & time: 03/01/18  1432     History   Chief Complaint Chief Complaint  Patient presents with  . bugs in throat  . Sore Throat    HPI [name] is a 57 y.o. male with history of multiple sclerosis, right leg cellulitis, headaches, is here for concern of bed bugs in his home for the last 1 year. States that an exterminator evaluated his home and told him he had bedbugs, he has been unable to get them exterminated due to cost. Patient initially reports itching all over his body including his face and head for the last 1 year.  Also feels like there are bed bugs crawling up inside his nose. He lives with his mother and brother and they also have intermittent itching for the last year. He is using a topical cream that does not provide relief. States that he was given doxycycline before for bedbugs that got "into his bone" and wants this medication again for bed bugs. He has no fevers. Chart review pt presented to ED for formication on 04/2017 and RLE cellulitis, he was admitted for observation for cellulitis and discharged with doxycyline. Alleviating factors: none. Aggravating factors: none. Chronic in onset. No other associated symptoms. Severity: moderate.  HPI  Past Medical History:  Diagnosis Date  . Abdominal pain, unspecified site   . Anxiety   . Arthritis   . Benign paroxysmal positional vertigo   . Cellulitis and abscess of right leg 04/2017  . Chronic back pain   . Chronic pain syndrome 01/25/2008  . Cluster headache   . Depression    takes Zoloft daily  . DVT (deep venous thrombosis) (HCC)    in the left arm '09  . Gait abnormality    "uses mobile wheelchair, but is ambulatory"  . Gallstones 02/17/2009   resolved after gallbladder surgery.  [name] GERD (gastroesophageal reflux disease)    takes Omeprazole as needed  . Headache(784.0)    cluster headaches frequently-takes Topamax  daily  . History of colonoscopy   . HTN (hypertension)    takes Lisinopril,Verapamil,and Triamterene HCTZ daily  . Insomnia 11/06/2008  . Joint pain   . Joint swelling    03-07-16 "swelling of right wrist" "after a fall-xray done 03-06-16 "no fractures".  . Memory loss    no an issue at present 03-07-16  . Multiple sclerosis (HCC)    Dx. 2005 - Dr. Tinnie Gens follows LOV 4'17 tx. Tysabri monthly IV-[facility] [place],.  [name] Nonspecific elevation of levels of transaminase or lactic acid dehydrogenase (LDH)   . Other specified visual disturbances   . Other syndromes affecting cervical region   . Pneumonia 2009  . Trigeminal neuralgia     history" Multiple sclerosis"    Patient Active Problem List   Diagnosis Date Noted  . Severe major depression, single episode, with psychotic features (HCC) 10/04/2017  . Weakness   . Cellulitis of right leg 05/07/2017  . Right carpal tunnel syndrome 02/16/2017  . Gait disturbance 01/22/2017  . Insomnia 01/22/2017  . Depression with anxiety 01/22/2017  . High risk medication use 01/22/2017  . Major depressive disorder, recurrent episode (HCC) 12/19/2016  . Suicidal ideation   . AKI (acute kidney injury) (HCC) 11/15/2016  . Chronic hepatitis C virus infection (HCC) 11/15/2016  . Chronic abdominal pain 11/15/2016  . Chronic pain syndrome 11/15/2016  . Acute retention of urine 11/15/2016  . Generalized abdominal pain   .  Chronic cluster headache, not intractable   . Community acquired pneumonia   . TB lung, latent   . HCAP (healthcare-associated pneumonia) 02/16/2016  . Hypokalemia 02/16/2016  . Intractable cluster headache syndrome   . DVT (deep venous thrombosis) (HCC)   . Depression   . GERD (gastroesophageal reflux disease)   . HTN (hypertension)   . Essential hypertension   . Gastroesophageal reflux disease without esophagitis   . CAP (community acquired pneumonia)   . Multiple sclerosis (HCC) 08/02/2013  . ABDOMINAL  BLOATING 10/14/2010  . LOOSE STOOLS 10/14/2010  . PULMONARY EMBOLISM, HX OF 10/14/2010    Past Surgical History:  Procedure Laterality Date  . CHOLECYSTECTOMY  02/20/2009  . COLONOSCOPY WITH PROPOFOL N/A 03/17/2016   Procedure: COLONOSCOPY WITH PROPOFOL;  Surgeon: [name], MD;  Location: [facility];  Service: Endoscopy;  Laterality: N/A;  . PORT A CATH REVISION N/A 07/06/2015   Procedure: Removal and replacement of PORT A CATH;  Surgeon: [name], MD;  Location: [facility] OR;  Service: General;  Laterality: N/A;  . PORTACATH PLACEMENT N/A 03/27/2014   Procedure: INSERTION PORT-A-CATH;  Surgeon: [name], MD;  Location: [facility] OR;  Service: General;  Laterality: N/A;        Home Medications    Prior to Admission medications   Medication Sig Start Date End Date Taking? Authorizing Provider  ARIPiprazole (ABILIFY) 10 MG tablet Take 1 tablet (10 mg total) by mouth at bedtime. For mood control 10/09/17  Yes Nwoko, Nicole Kindred I, NP  baclofen (LIORESAL) 20 MG tablet Take 1 tablet (20 mg total) by mouth 4 (four) times daily. For muscle spasms Patient taking differently: Take 20 mg by mouth 3 (three) times daily. 10a, 2pm, 10p - For muscle spasms 10/09/17  Yes Nwoko, Nicole Kindred I, NP  clotrimazole-betamethasone (LOTRISONE) cream Apply 1 application topically 2 (two) times daily as needed (itching/rash).  12/01/17  Yes [provider]  dalfampridine 10 MG TB12 Take 1 tablet (10 mg total) by mouth 2 (two) times daily. For MS Patient taking differently: Take 10 mg by mouth 2 (two) times daily. "Ampyra" 10a and 10p -For MS 10/09/17  Yes Nwoko, Agnes I, NP  Eszopiclone 3 MG TABS Take 3 mg by mouth at bedtime.   Yes [provider]  gabapentin (NEURONTIN) 600 MG tablet Take 600 mg by mouth 4 (four) times daily. 10am, 2pm, 6pm, 10pm 10/25/17  Yes [provider]  hydrochlorothiazide (HYDRODIURIL) 25 MG tablet Take 25 mg by mouth daily.   Yes [provider]    levETIRAcetam (KEPPRA) 500 MG tablet Take 500 mg by mouth 2 (two) times daily.   Yes [provider]  lisinopril-hydrochlorothiazide (PRINZIDE,ZESTORETIC) 10-12.5 MG tablet Take 1 tablet by mouth daily.   Yes [provider]  omeprazole (PRILOSEC) 20 MG capsule Take 20 mg by mouth 2 (two) times daily as needed (acid reflux/ indigestion).  10/08/17  Yes [provider]  Oxcarbazepine (TRILEPTAL) 300 MG tablet Take 1 tablet (300 mg total) by mouth 3 (three) times daily. For mood stabilization 10/09/17  Yes Nwoko, Nicole Kindred I, NP  oxyCODONE-acetaminophen (PERCOCET) 10-325 MG tablet Take 1 tablet by mouth every 4 (four) hours as needed for pain. Patient taking differently: Take 1 tablet by mouth every 4 (four) hours.  10/09/17  Yes Armandina Stammer I, NP  potassium chloride SA (KLOR-CON M20) 20 MEQ tablet Take 1 tablet (20 mEq total) by mouth daily. For potassium replacement 10/10/17  Yes Armandina Stammer I, NP  sertraline (ZOLOFT) 50  MG tablet Take 1 tablet (50 mg total) by mouth daily. For depression 10/10/17  Yes Nwoko, Nicole Kindred I, NP  SUMAtriptan (IMITREX) 100 MG tablet 1 TABLET BY MOUTH AT ONSET OF HEADACHE, MAY REPEAT IN 2HRS IF NEEDED.DO NOT EXCEED 2 TABS IN 24HRS 02/15/18  Yes [provider]  topiramate (TOPAMAX) 25 MG tablet Take 25 mg by mouth 2 (two) times daily. 4pm 11/13/17  Yes [provider]  traZODone (DESYREL) 100 MG tablet Take 100 mg by mouth at bedtime as needed for sleep.  12/16/17  Yes [provider]  triamterene-hydrochlorothiazide (DYAZIDE) 37.5-25 MG capsule Take 1 each (1 capsule total) by mouth daily. For high blood pressure 10/09/17  Yes Nwoko, Agnes I, NP  verapamil (CALAN) 80 MG tablet Take 1 tablet (80 mg total) by mouth 2 (two) times daily. For high blood pressure Patient taking differently: Take 80 mg by mouth See admin instructions. Take one tablet (80 mg) by mouth twice daily - 2pm and 10pm. For high blood pressure 10/09/17  Yes Nwoko,  Agnes I, NP  XARELTO 10 MG TABS tablet Take 1 tablet (10 mg total) by mouth daily. For blood clot prevention Patient taking differently: Take 10 mg by mouth every evening. For blood clot prevention 10/09/17  Yes Nwoko, Nicole Kindred I, NP  dicyclomine (BENTYL) 20 MG tablet Take 1 tablet (20 mg total) by mouth every 6 (six) hours as needed for spasms. Patient not taking: Reported on 03/01/2018 10/09/17   Armandina Stammer I, NP  gabapentin (NEURONTIN) 800 MG tablet Take 0.5 tablets (400 mg total) by mouth 3 (three) times daily. For agitation/pain Patient not taking: Reported on 03/01/2018 10/09/17   Armandina Stammer I, NP  hydrOXYzine (ATARAX/VISTARIL) 25 MG tablet Take 1 tablet (25 mg total) by mouth every 6 (six) hours as needed for anxiety. Patient not taking: Reported on 03/01/2018 10/09/17   Armandina Stammer I, NP  naloxone [facility]) nasal spray 4 mg/0.1 mL Place 1 spray into the nose once as needed (opiod overdose).    [provider]  nortriptyline (PAMELOR) 50 MG capsule Take 1 capsule (50 mg total) by mouth at bedtime. For depression/sleep Patient not taking: Reported on 03/01/2018 10/09/17   Armandina Stammer I, NP  Ocrelizumab (OCREVUS IV) Inject into the vein every 6 (six) months.    [provider]  omeprazole (PRILOSEC) 40 MG capsule Take 1 capsule (40 mg total) by mouth every evening. For acid reflux Patient not taking: Reported on 03/01/2018 10/09/17   Armandina Stammer I, NP  topiramate (TOPAMAX) 100 MG tablet Take 1 tablet (100 mg total) by mouth at bedtime. For mood stabilization Patient not taking: Reported on 03/01/2018 10/09/17   Sanjuana Kava, NP    Family History Family History  Problem Relation Age of Onset  . Cancer Father   . Diabetes Mother     Social History Social History   Tobacco Use  . Smoking status: Former Smoker    Packs/day: 1.00    Years: 38.00    Pack years: 38.00    Types: Cigarettes  . Smokeless tobacco: Never Used  . Tobacco comment: cutting back  Substance  Use Topics  . Alcohol use: Yes    Alcohol/week: 0.0 oz    Comment: occasional  . Drug use: No    Comment: Quit 2011     Allergies   Patient has no known allergies.   Review of Systems Review of Systems  HENT:       Formication inside nose  Skin:       Itching and formication throughout skin   All other systems reviewed and are negative.    Physical Exam Updated Vital Signs BP 116/72 (BP Location: Right Arm)   Pulse 72   Temp 98.5 F (36.9 C) (Oral)   Resp 14   Ht 5\' 9"  (1.753 m)   Wt 78.5 kg (173 lb)   SpO2 94%   BMI 25.55 kg/m   Physical Exam  Constitutional: He is oriented to person, place, and time. He appears well-developed and well-nourished. No distress.  Non toxic.  HENT:  Head: Normocephalic and atraumatic.  Nose: Nose normal.  Mouth/Throat: No oropharyngeal exudate.  Moderate mucosal edema bilaterally, no rhinorrhea, epistaxis. No obvious foreign bodies inside nose. Septum midline. Moist mucous membranes. Oropharynx and tonsils normal. No edema to lips, tongue.  Eyes: Pupils are equal, round, and reactive to light. Conjunctivae and EOM are normal.  Neck: Normal range of motion.  Cardiovascular: Normal rate, regular rhythm, normal heart sounds and intact distal pulses.  No murmur heard. 2+ DP and radial pulses bilaterally. No LE edema.   Pulmonary/Chest: Effort normal and breath sounds normal.  Abdominal: Soft. Bowel sounds are normal. There is no tenderness.  No G/R/R. No suprapubic or CVA tenderness.   Musculoskeletal: Normal range of motion. He exhibits no deformity.  Neurological: He is alert and oriented to person, place, and time.  Skin: Skin is warm and dry. Capillary refill takes less than 2 seconds.  Hyperpigmented plaques extending it over left upper back, nontender without erythema, edema, warmth. Some excoriation noted to the left posterior shoulder. No signs ofinsect bites to nailbeds, fingers, toes.  Psychiatric: He has a normal mood and  affect. His behavior is normal. Judgment and thought content normal.  Nursing note and vitals reviewed.    ED Treatments / Results  Labs (all labs ordered are listed, but only abnormal results are displayed) Labs Reviewed - No data to display  EKG None  Radiology No results found.  Procedures Procedures (including critical care time)  Medications Ordered in ED Medications - No data to display   Initial Impression / Assessment and Plan / ED Course  I have reviewed the triage vital signs and the nursing notes.  Pertinent labs & imaging results that were available during my care of the patient were reviewed by me and considered in my medical decision making (see chart for details).    57 year old here with formication for the last 1 year, was told by an exterminator he had bedbugs in his house. Family also having itching. Ddx for chronic formication includes insect etiology however no signs of bed bugs or bites today on exam. He has excoriation and hyperpigmented plaques to his back. No signs of cellulitis.  He is adamant about obtaining prescription for doxycycline, states he was given his last time for bedbugs. Chart review reveals he was admitted for right lower extremity cellulitis and discharged with doxycycline 6 months ago. At that time he also presented with formication. Explained to patient that bedbugs are treated symptomatically and the extermination, there is no role for doxycycline today. Encouraged to follow-up with PCP. Provided reassurance, instructed to use heat to clean and wash his clothes. Discussed return precautions. Patient has a legal guardian per chart, I spoke to his mother and notified her that patient will be discharged from the emergency department today. She verbalized understanding Final Clinical Impressions(s) / ED Diagnoses   Final diagnoses:  Concern about infectious disease without diagnosis  ED Discharge Orders    None       Jerrell Mylar 03/01/18 1728    Maia Plan, MD 03/02/18 [phone]

## 2018-03-01 NOTE — Discharge Instructions (Signed)
The only way to get rid of bed bugs is with an exterminator or cleaning everything in your home with heat or hot water.  Medical management of bed bugs include symptom control such as treating itching.  Antibiotics such as doxycyline are not used for bed bugs as this will not kill bed bugs.  Antibiotics are used for bacterial skin infection called cellulitis.   Take benadryl for itching. Continue using cream for itching.   Return if there is skin swelling, redness, warmth, pain, fevers.

## 2018-03-04 ENCOUNTER — Emergency Department (HOSPITAL_COMMUNITY): Payer: 59

## 2018-03-04 ENCOUNTER — Other Ambulatory Visit: Payer: Self-pay

## 2018-03-04 ENCOUNTER — Encounter (HOSPITAL_COMMUNITY): Payer: Self-pay | Admitting: *Deleted

## 2018-03-04 ENCOUNTER — Emergency Department (HOSPITAL_COMMUNITY)
Admission: EM | Admit: 2018-03-04 | Discharge: 2018-03-04 | Disposition: A | Discharge status: Home / Self Care | Payer: 59 | Attending: Emergency Medicine | Admitting: Emergency Medicine

## 2018-03-04 DIAGNOSIS — L710 Perioral dermatitis: Secondary | ICD-10-CM | POA: Insufficient documentation

## 2018-03-04 DIAGNOSIS — R51 Headache: Secondary | ICD-10-CM | POA: Insufficient documentation

## 2018-03-04 DIAGNOSIS — R519 Headache, unspecified: Secondary | ICD-10-CM

## 2018-03-04 DIAGNOSIS — R079 Chest pain, unspecified: Secondary | ICD-10-CM | POA: Diagnosis present

## 2018-03-04 LAB — CBC
HCT: 40.8 % (ref 39.0–52.0)
Hemoglobin: 13.2 g/dL (ref 13.0–17.0)
MCH: 28.2 pg (ref 26.0–34.0)
MCHC: 32.4 g/dL (ref 30.0–36.0)
MCV: 87.2 fL (ref 78.0–100.0)
PLATELETS: 278 10*3/uL (ref 150–400)
RBC: 4.68 MIL/uL (ref 4.22–5.81)
RDW: 16 % — AB (ref 11.5–15.5)
WBC: 7.9 10*3/uL (ref 4.0–10.5)

## 2018-03-04 LAB — I-STAT TROPONIN, ED
TROPONIN I, POC: 0.01 ng/mL (ref 0.00–0.08)
TROPONIN I, POC: 0 ng/mL (ref 0.00–0.08)

## 2018-03-04 LAB — BASIC METABOLIC PANEL
Anion gap: 11 (ref 5–15)
BUN: 5 mg/dL — ABNORMAL LOW (ref 6–20)
CALCIUM: 9.3 mg/dL (ref 8.9–10.3)
CO2: 17 mmol/L — ABNORMAL LOW (ref 22–32)
CREATININE: 0.86 mg/dL (ref 0.61–1.24)
Chloride: 114 mmol/L — ABNORMAL HIGH (ref 101–111)
GFR calc non Af Amer: >60 mL/min (ref >60)
Glucose, Bld: 100 mg/dL — ABNORMAL HIGH (ref 65–99)
Potassium: 3.5 mmol/L (ref 3.5–5.1)
SODIUM: 142 mmol/L (ref 135–145)

## 2018-03-04 LAB — URINALYSIS, ROUTINE W REFLEX MICROSCOPIC
Bilirubin Urine: NEGATIVE
GLUCOSE, UA: NEGATIVE mg/dL
HGB URINE DIPSTICK: NEGATIVE
Ketones, ur: NEGATIVE mg/dL
Leukocytes, UA: NEGATIVE
Nitrite: NEGATIVE
PH: 6 (ref 5.0–8.0)
PROTEIN: NEGATIVE mg/dL
Specific Gravity, Urine: 1.009 (ref 1.005–1.030)

## 2018-03-04 MED ORDER — SODIUM CHLORIDE 0.9 % IV BOLUS
1000.0000 mL | Freq: Once | INTRAVENOUS | Status: AC
  Administered 2018-03-04: 1000 mL via INTRAVENOUS

## 2018-03-04 MED ORDER — RIVAROXABAN 10 MG PO TABS
10.0000 mg | ORAL_TABLET | Freq: Every evening | ORAL | Status: DC
Start: 2018-03-04 — End: 2018-03-04
  Filled 2018-03-04: qty 1

## 2018-03-04 MED ORDER — GABAPENTIN 300 MG PO CAPS: 600.0000 mg | ORAL_CAPSULE | Freq: Four times a day (QID) | ORAL | Status: DC

## 2018-03-04 MED ORDER — DOXYCYCLINE HYCLATE 100 MG PO CAPS: 100.0000 mg | ORAL_CAPSULE | Freq: Two times a day (BID) | ORAL | 0 refills | Status: DC

## 2018-03-04 MED ORDER — HEPARIN SOD (PORK) LOCK FLUSH 100 UNIT/ML IV SOLN
500.0000 [IU] | Freq: Once | INTRAVENOUS | Status: AC
  Administered 2018-03-04: 500 [IU]
  Filled 2018-03-04: qty 5

## 2018-03-04 NOTE — Discharge Instructions (Signed)
Please read and follow all provided instructions.  Your diagnoses today include:  1. Chest pain, unspecified type   2. Perioral dermatitis   3. Acute nonintractable headache, unspecified headache type     Tests performed today include:  An EKG of your heart  A chest x-ray  Cardiac enzymes - a blood test for heart muscle damage  Blood counts and electrolytes  Head CT - no problems  Vital signs. See below for your results today.   Medications prescribed:   Doxycycline - antibiotic  You have been prescribed an antibiotic medicine: take the entire course of medicine even if you are feeling better. Stopping early can cause the antibiotic not to work.  Take any prescribed medications only as directed.  Follow-up instructions: Please follow-up with your primary care provider as soon as you can for further evaluation of your symptoms.   Return instructions:  SEEK IMMEDIATE MEDICAL ATTENTION IF:  You have severe chest pain, especially if the pain is crushing or pressure-like and spreads to the arms, back, neck, or jaw, or if you have sweating, nausea (feeling sick to your stomach), or shortness of breath. THIS IS AN EMERGENCY. Don't wait to see if the pain will go away. Get medical help at once. Call 911 or [phone] (operator). DO NOT drive yourself to the hospital.   Your chest pain gets worse and does not go away with rest.   You have an attack of chest pain lasting longer than usual, despite rest and treatment with the medications your caregiver has prescribed.   You wake from sleep with chest pain or shortness of breath.  You feel dizzy or faint.  You have chest pain not typical of your usual pain for which you originally saw your caregiver.   You have any other emergent concerns regarding your health.  Additional Information: Chest pain comes from many different causes. Your caregiver has diagnosed you as having chest pain that is not specific for one problem, but does not  require admission.  You are at low risk for an acute heart condition or other serious illness.   Your vital signs today were: BP (!) 163/85    Pulse 63    Temp 98.2 F (36.8 C) (Oral)    Resp 18    SpO2 100%  If your blood pressure (BP) was elevated above 135/85 this visit, please have this repeated by your doctor within one month. --------------

## 2018-03-04 NOTE — ED Notes (Signed)
Patient transported to CT 

## 2018-03-04 NOTE — ED Triage Notes (Signed)
To ED for eval of cp and weakness for the weak. States he is unable to stand- pt using electric wheelchair for assist. Complains of HA and right arm pain- states he has arthritis. Decreased appetite for 4 days.

## 2018-03-04 NOTE — ED Provider Notes (Signed)
[facility] EMERGENCY DEPARTMENT Provider Note   CSN: [id number] Arrival date & time: 03/04/18  4098     History   Chief Complaint Chief Complaint  Patient presents with  . Chest Pain    HPI [name] is a 57 y.o. male.  Patient with history of multiple sclerosis, chronic pain -- presents with c/o generalized weakness, recent falls, headache, chest pains.  Patient states that he has a nurse who is at his house every day.  States that they recommended he come to the emergency department today.  Patient reports a generalized chest pain without radiation.  No shortness of breath.  No fevers or cough.  Patient reports a headache like something is crawling all over his head.  He states that it is severe and he has never had a head ache like this before.  He also states that he fell twice yesterday.  Reports decreased appetite and asks why this is.  States that his symptoms are completely different than when he was seen for crawling sensation in his throat at [place] [facility] 3 days ago.  Patient states he is taking his home pain medications.  He uses an Mining engineer wheelchair at home. The onset of this condition was acute. The course is constant. Aggravating factors: none. Alleviating factors: none.       Past Medical History:  Diagnosis Date  . Abdominal pain, unspecified site   . Anxiety   . Arthritis   . Benign paroxysmal positional vertigo   . Cellulitis and abscess of right leg 04/2017  . Chronic back pain   . Chronic pain syndrome 01/25/2008  . Cluster headache   . Depression    takes Zoloft daily  . DVT (deep venous thrombosis) (HCC)    in the left arm '09  . Gait abnormality    "uses mobile wheelchair, but is ambulatory"  . Gallstones 02/17/2009   resolved after gallbladder surgery.  [name] GERD (gastroesophageal reflux disease)    takes Omeprazole as needed  . Headache(784.0)    cluster headaches frequently-takes Topamax daily  . History of colonoscopy     . HTN (hypertension)    takes Lisinopril,Verapamil,and Triamterene HCTZ daily  . Insomnia 11/06/2008  . Joint pain   . Joint swelling    03-07-16 "swelling of right wrist" "after a fall-xray done 03-06-16 "no fractures".  . Memory loss    no an issue at present 03-07-16  . Multiple sclerosis (HCC)    Dx. 2005 - Dr. Tinnie Gens follows LOV 4'17 tx. Tysabri monthly IV-[facility] , [place].  [name] Nonspecific elevation of levels of transaminase or lactic acid dehydrogenase (LDH)   . Other specified visual disturbances   . Other syndromes affecting cervical region   . Pneumonia 2009  . Trigeminal neuralgia     history" Multiple sclerosis"    Patient Active Problem List   Diagnosis Date Noted  . Severe major depression, single episode, with psychotic features (HCC) 10/04/2017  . Weakness   . Cellulitis of right leg 05/07/2017  . Right carpal tunnel syndrome 02/16/2017  . Gait disturbance 01/22/2017  . Insomnia 01/22/2017  . Depression with anxiety 01/22/2017  . High risk medication use 01/22/2017  . Major depressive disorder, recurrent episode (HCC) 12/19/2016  . Suicidal ideation   . AKI (acute kidney injury) (HCC) 11/15/2016  . Chronic hepatitis C virus infection (HCC) 11/15/2016  . Chronic abdominal pain 11/15/2016  . Chronic pain syndrome 11/15/2016  . Acute retention of urine 11/15/2016  .  Generalized abdominal pain   . Chronic cluster headache, not intractable   . Community acquired pneumonia   . TB lung, latent   . HCAP (healthcare-associated pneumonia) 02/16/2016  . Hypokalemia 02/16/2016  . Intractable cluster headache syndrome   . DVT (deep venous thrombosis) (HCC)   . Depression   . GERD (gastroesophageal reflux disease)   . HTN (hypertension)   . Essential hypertension   . Gastroesophageal reflux disease without esophagitis   . CAP (community acquired pneumonia)   . Multiple sclerosis (HCC) 08/02/2013  . ABDOMINAL BLOATING 10/14/2010  . LOOSE STOOLS  10/14/2010  . PULMONARY EMBOLISM, HX OF 10/14/2010    Past Surgical History:  Procedure Laterality Date  . CHOLECYSTECTOMY  02/20/2009  . COLONOSCOPY WITH PROPOFOL N/A 03/17/2016   Procedure: COLONOSCOPY WITH PROPOFOL;  Surgeon: [name], MD;  Location: [facility] ENDOSCOPY;  Service: Endoscopy;  Laterality: N/A;  . PORT A CATH REVISION N/A 07/06/2015   Procedure: Removal and replacement of PORT A CATH;  Surgeon: [name], MD;  Location: [facility] OR;  Service: General;  Laterality: N/A;  . PORTACATH PLACEMENT N/A 03/27/2014   Procedure: INSERTION PORT-A-CATH;  Surgeon: [name], MD;  Location: [facility] OR;  Service: General;  Laterality: N/A;        Home Medications    Prior to Admission medications   Medication Sig Start Date End Date Taking? Authorizing Provider  ARIPiprazole (ABILIFY) 10 MG tablet Take 1 tablet (10 mg total) by mouth at bedtime. For mood control 10/09/17   Armandina Stammer I, NP  baclofen (LIORESAL) 20 MG tablet Take 1 tablet (20 mg total) by mouth 4 (four) times daily. For muscle spasms Patient taking differently: Take 20 mg by mouth 3 (three) times daily. 10a, 2pm, 10p - For muscle spasms 10/09/17   Armandina Stammer I, NP  clotrimazole-betamethasone (LOTRISONE) cream Apply 1 application topically 2 (two) times daily as needed (itching/rash).  12/01/17   [provider]  dalfampridine 10 MG TB12 Take 1 tablet (10 mg total) by mouth 2 (two) times daily. For MS Patient taking differently: Take 10 mg by mouth 2 (two) times daily. "Ampyra" 10a and 10p -For MS 10/09/17   Armandina Stammer I, NP  dicyclomine (BENTYL) 20 MG tablet Take 1 tablet (20 mg total) by mouth every 6 (six) hours as needed for spasms. Patient not taking: Reported on 03/01/2018 10/09/17   Armandina Stammer I, NP  Eszopiclone 3 MG TABS Take 3 mg by mouth at bedtime.    [provider]  gabapentin (NEURONTIN) 600 MG tablet Take 600 mg by mouth 4 (four) times daily. 10am, 2pm, 6pm, 10pm 10/25/17   [provider]  gabapentin (NEURONTIN) 800 MG tablet Take 0.5 tablets (400 mg total) by mouth 3 (three) times daily. For agitation/pain Patient not taking: Reported on 03/01/2018 10/09/17   Armandina Stammer I, NP  hydrochlorothiazide (HYDRODIURIL) 25 MG tablet Take 25 mg by mouth daily.    [provider]  hydrOXYzine (ATARAX/VISTARIL) 25 MG tablet Take 1 tablet (25 mg total) by mouth every 6 (six) hours as needed for anxiety. Patient not taking: Reported on 03/01/2018 10/09/17   Armandina Stammer I, NP  levETIRAcetam (KEPPRA) 500 MG tablet Take 500 mg by mouth 2 (two) times daily.    [provider]  lisinopril-hydrochlorothiazide (PRINZIDE,ZESTORETIC) 10-12.5 MG tablet Take 1 tablet by mouth daily.    [provider]  naloxone [facility]) nasal spray 4 mg/0.1 mL Place 1 spray into the nose once as needed (opiod  overdose).    [provider]  nortriptyline (PAMELOR) 50 MG capsule Take 1 capsule (50 mg total) by mouth at bedtime. For depression/sleep Patient not taking: Reported on 03/01/2018 10/09/17   Armandina Stammer I, NP  Ocrelizumab (OCREVUS IV) Inject into the vein every 6 (six) months.    [provider]  omeprazole (PRILOSEC) 20 MG capsule Take 20 mg by mouth 2 (two) times daily as needed (acid reflux/ indigestion).  10/08/17   [provider]  omeprazole (PRILOSEC) 40 MG capsule Take 1 capsule (40 mg total) by mouth every evening. For acid reflux Patient not taking: Reported on 03/01/2018 10/09/17   Armandina Stammer I, NP  Oxcarbazepine (TRILEPTAL) 300 MG tablet Take 1 tablet (300 mg total) by mouth 3 (three) times daily. For mood stabilization 10/09/17   Armandina Stammer I, NP  oxyCODONE-acetaminophen (PERCOCET) 10-325 MG tablet Take 1 tablet by mouth every 4 (four) hours as needed for pain. Patient taking differently: Take 1 tablet by mouth every 4 (four) hours.  10/09/17   Armandina Stammer I, NP  potassium chloride SA (KLOR-CON M20) 20 MEQ tablet Take 1 tablet (20  mEq total) by mouth daily. For potassium replacement 10/10/17   Armandina Stammer I, NP  sertraline (ZOLOFT) 50 MG tablet Take 1 tablet (50 mg total) by mouth daily. For depression 10/10/17   Armandina Stammer I, NP  SUMAtriptan (IMITREX) 100 MG tablet 1 TABLET BY MOUTH AT ONSET OF HEADACHE, MAY REPEAT IN 2HRS IF NEEDED.DO NOT EXCEED 2 TABS IN 24HRS 02/15/18   [provider]  topiramate (TOPAMAX) 100 MG tablet Take 1 tablet (100 mg total) by mouth at bedtime. For mood stabilization Patient not taking: Reported on 03/01/2018 10/09/17   Armandina Stammer I, NP  topiramate (TOPAMAX) 25 MG tablet Take 25 mg by mouth 2 (two) times daily. 4pm 11/13/17   [provider]  traZODone (DESYREL) 100 MG tablet Take 100 mg by mouth at bedtime as needed for sleep.  12/16/17   [provider]  triamterene-hydrochlorothiazide (DYAZIDE) 37.5-25 MG capsule Take 1 each (1 capsule total) by mouth daily. For high blood pressure 10/09/17   Nwoko, Nicole Kindred I, NP  verapamil (CALAN) 80 MG tablet Take 1 tablet (80 mg total) by mouth 2 (two) times daily. For high blood pressure Patient taking differently: Take 80 mg by mouth See admin instructions. Take one tablet (80 mg) by mouth twice daily - 2pm and 10pm. For high blood pressure 10/09/17   Nwoko, Agnes I, NP  XARELTO 10 MG TABS tablet Take 1 tablet (10 mg total) by mouth daily. For blood clot prevention Patient taking differently: Take 10 mg by mouth every evening. For blood clot prevention 10/09/17   Sanjuana Kava, NP    Family History Family History  Problem Relation Age of Onset  . Cancer Father   . Diabetes Mother     Social History Social History   Tobacco Use  . Smoking status: Former Smoker    Packs/day: 1.00    Years: 38.00    Pack years: 38.00    Types: Cigarettes  . Smokeless tobacco: Never Used  . Tobacco comment: cutting back  Substance Use Topics  . Alcohol use: Yes    Alcohol/week: 0.0 oz    Comment: occasional  . Drug use: No    Comment:  Quit 2011     Allergies   Patient has no known allergies.   Review of Systems Review of Systems  Constitutional: Positive for appetite change. Negative  for diaphoresis and fever.  HENT: Negative for rhinorrhea and sore throat.   Eyes: Negative for redness.  Respiratory: Negative for cough and shortness of breath.   Cardiovascular: Positive for chest pain. Negative for palpitations and leg swelling.  Gastrointestinal: Negative for abdominal pain, diarrhea, nausea and vomiting.  Genitourinary: Negative for dysuria.  Musculoskeletal: Negative for back pain, myalgias and neck pain.  Skin: Negative for rash.  Neurological: Positive for weakness and headaches. Negative for syncope and light-headedness.  Psychiatric/Behavioral: The patient is not nervous/anxious.      Physical Exam Updated Vital Signs BP (!) 130/97 (BP Location: Left Arm)   Pulse 67   Temp 98.2 F (36.8 C) (Oral)   Resp 18   SpO2 100%   Physical Exam  Constitutional: He is oriented to person, place, and time. He appears well-developed and well-nourished.  HENT:  Head: Normocephalic and atraumatic.  Mouth/Throat: Oropharynx is clear and moist.  Eyes: Conjunctivae are normal. Right eye exhibits no discharge. Left eye exhibits no discharge.  Neck: Normal range of motion. Neck supple.  Cardiovascular: Normal rate, regular rhythm and normal heart sounds.  Pulmonary/Chest: Effort normal and breath sounds normal. No stridor. No respiratory distress. He has no wheezes. He has no rales.  Abdominal: Soft. There is no tenderness. There is no guarding.  Musculoskeletal: He exhibits no edema or tenderness.  Neurological: He is alert and oriented to person, place, and time. No cranial nerve deficit.  Skin: Skin is warm and dry.  Psychiatric: He has a normal mood and affect.  Nursing note and vitals reviewed.    ED Treatments / Results  Labs (all labs ordered are listed, but only abnormal results are displayed) Labs  Reviewed  BASIC METABOLIC PANEL - Abnormal; Notable for the following components:      Result Value   Chloride 114 (*)    CO2 17 (*)    Glucose, Bld 100 (*)    BUN 5 (*)    All other components within normal limits  CBC - Abnormal; Notable for the following components:   RDW 16.0 (*)    All other components within normal limits  URINALYSIS, ROUTINE W REFLEX MICROSCOPIC  I-STAT TROPONIN, ED  I-STAT TROPONIN, ED    EKG EKG Interpretation  Date/Time:  Thursday March 04 2018 09:40:17 EDT Ventricular Rate:  62 PR Interval:  180 QRS Duration: 84 QT Interval:  410 QTC Calculation: 416 R Axis:   64 Text Interpretation:  Normal sinus rhythm Normal ECG st changes on prior ECG decreased Confirmed by Linwood Dibbles [phone]) on 03/04/2018 5:50:05 PM   Radiology Dg Chest 2 View  Result Date: 03/04/2018 CLINICAL DATA:  Chest pain EXAM: CHEST - 2 VIEW COMPARISON:  12/19/2017 FINDINGS: Right Port-A-Cath remains in place, unchanged. Heart and mediastinal contours are within normal limits. No focal opacities or effusions. No acute bony abnormality. IMPRESSION: No active cardiopulmonary disease. Electronically Signed   By: Charlett Nose M.D.   On: 03/04/2018 10:17    Procedures Procedures (including critical care time)  Medications Ordered in ED Medications  sodium chloride 0.9 % bolus 1,000 mL (has no administration in time range)     Initial Impression / Assessment and Plan / ED Course  I have reviewed the triage vital signs and the nursing notes.  Pertinent labs & imaging results that were available during my care of the patient were reviewed by me and considered in my medical decision making (see chart for details).     Patient seen and  examined. Work-up reviewed. Will check delta troponin and repeat EKG (it was unchanged).   Vital signs reviewed and are as follows: BP (!) 130/97 (BP Location: Left Arm)   Pulse 67   Temp 98.2 F (36.8 C) (Oral)   Resp 18   SpO2 100%   Given  reported severe HA, will eval with head CT. Pt requests fluids, will need UA, ordered. Overall, I have low suspicion for acute medical emergency today. Pt has help at home he states.   8:21 PM patient updated on all results.  Discussed that he can be discharged home at this time.  Prior to discharge, patient is adamant that he needs doxycycline for the bugs on his skin.  He shows me areas on his back, and his nose, on his chin which she states are affected.  Patient does have some mild erythema consistent with dermatitis on the right lower chin.  Discussed risks and benefits of antibiotics for this.  Patient states that he did not have any side effects with his previous course and is requesting this today.  Will prescribe antibiotics for mild cellulitis/dermatitis on the chin area.  Patient urged to return with worsening symptoms or other concerns. Patient verbalized understanding and agrees with plan.    Final Clinical Impressions(s) / ED Diagnoses   Final diagnoses:  Chest pain, unspecified type  Perioral dermatitis  Acute nonintractable headache, unspecified headache type   Patient with multiple complaints today.  Chest pain evaluated with troponin x2, EKG x2, chest x-ray.  No acute findings.  I do not suspect PE, patient is anticoagulated.  Normal vital signs without hypoxia or tachycardia.  Patient also with generalized weakness.  No obvious signs of infection noted on chest x-ray, urine, skin exam, and rest of the evaluation.  Patient does have a mild area of facial dermatitis which will be treated with doxycycline at patient's request.  He states he has tried steroids and mupirocin on the area and these have not helped.  Patient planes of severe headache.  He has no new focal neurological deficits.  Head CT performed given reported falls yesterday.  Also patient is on a blood thinner.  These were negative.  ED Discharge Orders        Ordered    doxycycline (VIBRAMYCIN) 100 MG capsule   2 times daily     03/04/18 2020       Renne Crigler, PA-C 03/04/18 2025    Linwood Dibbles, MD 03/05/18 1431

## 2018-03-04 NOTE — ED Notes (Signed)
ED Provider at bedside. 

## 2018-03-05 ENCOUNTER — Encounter

## 2018-03-08 ENCOUNTER — Ambulatory Visit: Payer: 59

## 2018-03-08 IMAGING — MR MR HEAD W/O CM
11 series · 45 of 48 positions shown · non-contrast
Comparison: 06/05/2016.

CLINICAL DATA: 55-year-old male with history multiple sclerosis
recently started new medication. Subsequent encounter.

EXAM:
MRI HEAD WITHOUT CONTRAST
TECHNIQUE: Multiplanar, multiecho pulse sequences of the brain and surrounding
structures were obtained without intravenous contrast.

[Series 2: T1 · sagittal · 5.0mm · 0.45mm/px · 3 of 21 slices shown]
[im 1/21]
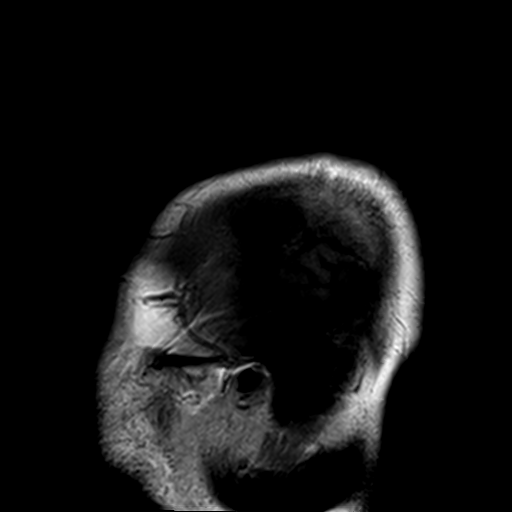
[im 11/21]
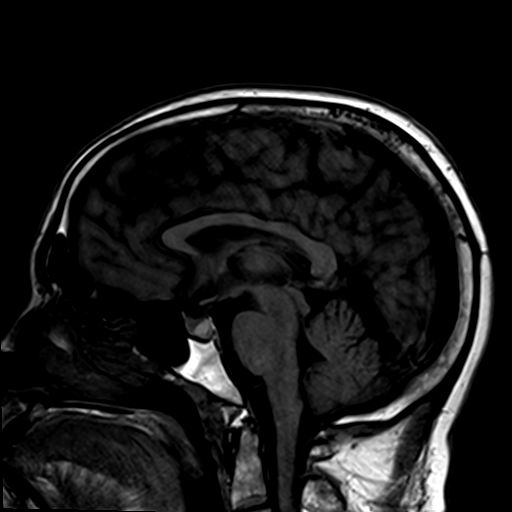
[im 21/21]
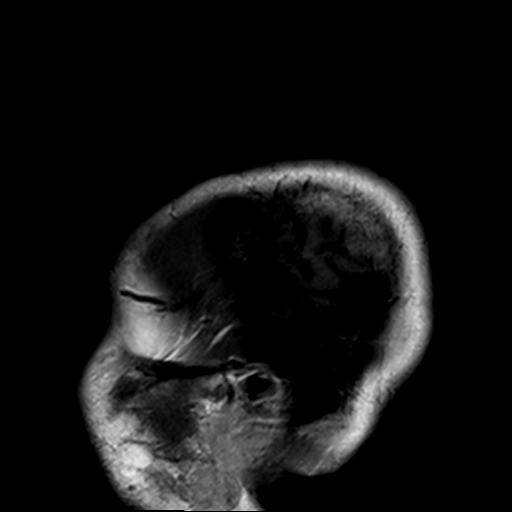

[Series 3: DWI · axial · 3.0mm · 1.80mm/px · z∈[-59,+87]mm · 8 of 98 slices shown (1 of 4)]
[im 1/98]
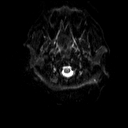
[im 11/98]
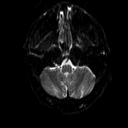
[im 33/98]
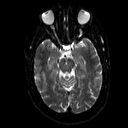
[im 44/98]
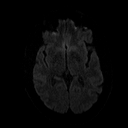
[im 54/98]
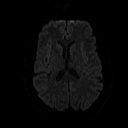
[im 65/98]
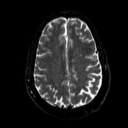
[im 87/98]
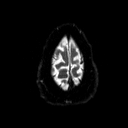
[im 98/98]
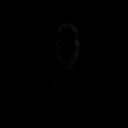

[Series 4: DWI · axial · 3.0mm · 1.80mm/px · z∈[-59,+87]mm · 4 of 44 slices shown (2 of 4)]
[im 1/44]
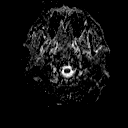
[im 15/44]
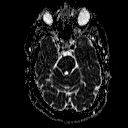
[im 29/44]
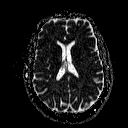
[im 44/44]
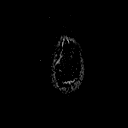

[Series 6: swi_images · axial · 2.0mm · 0.90mm/px · z∈[-65,+93]mm · 7 of 80 slices shown]
[im 1/80]
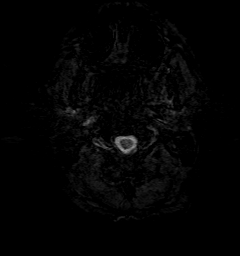
[im 14/80]
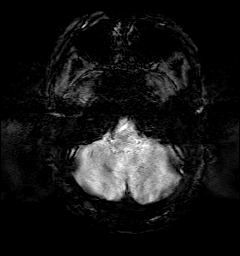
[im 27/80]
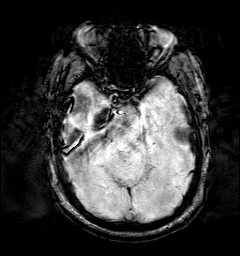
[im 40/80]
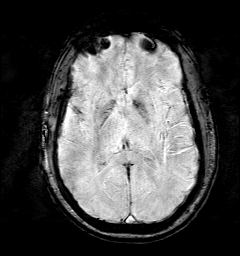
[im 53/80]
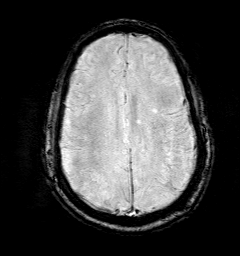
[im 66/80]
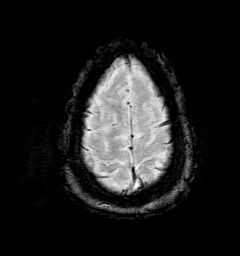
[im 80/80]
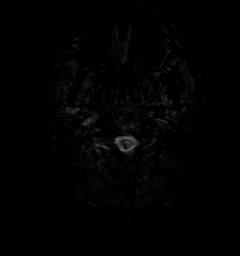

[Series 7: DWI · coronal · 5.0mm · 1.80mm/px · 6 of 68 slices shown (3 of 4)]
[im 1/68]
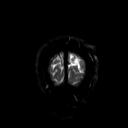
[im 14/68]
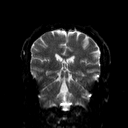
[im 27/68]
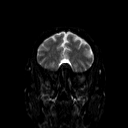
[im 41/68]
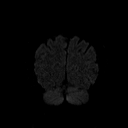
[im 54/68]
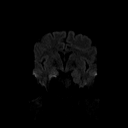
[im 68/68]
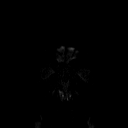

[Series 8: DWI · coronal · 5.0mm · 1.80mm/px · 3 of 34 slices shown (4 of 4)]
[im 1/34]
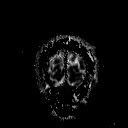
[im 17/34]
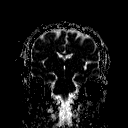
[im 34/34]
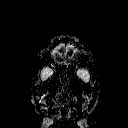

[Series 9: T2 · axial · 5.0mm · 0.51mm/px · z∈[-56,+85]mm · 2 of 22 slices shown (1 of 2)]
[im 1/22]
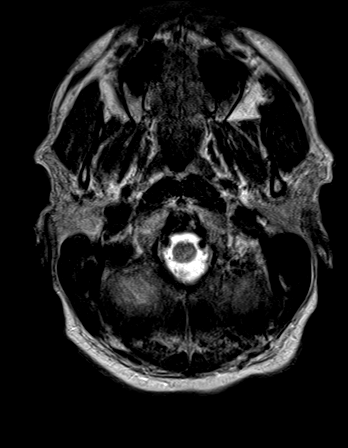
[im 22/22]
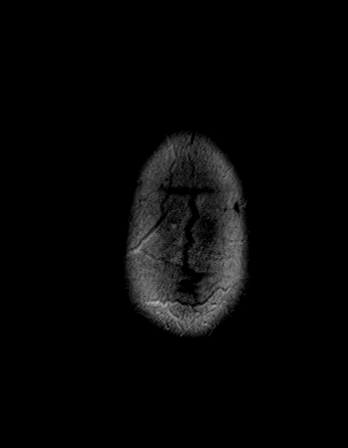

[Series 10: FLAIR · axial · 5.0mm · 0.45mm/px · z∈[-57,+85]mm · 2 of 22 slices shown (1 of 2)]
[im 1/22]
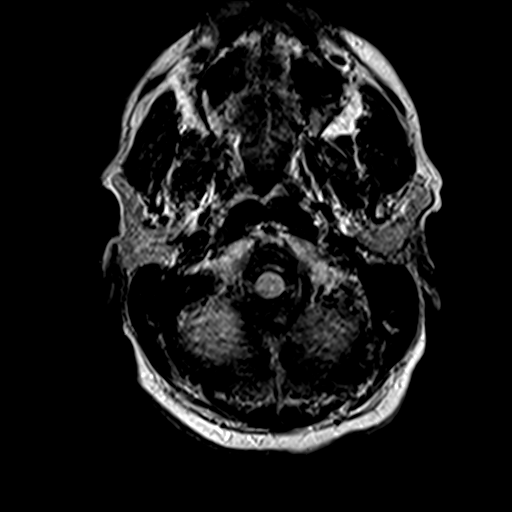
[im 22/22]
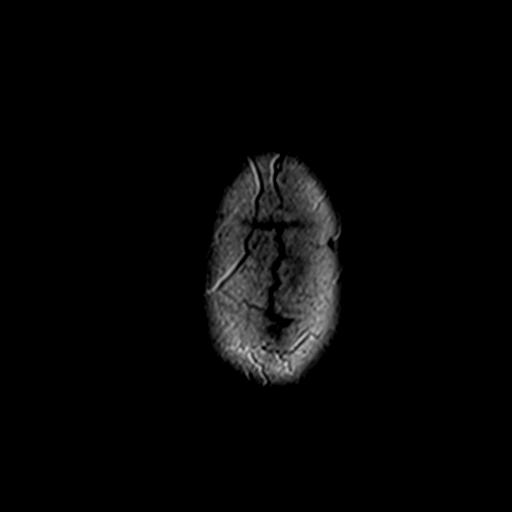

[Series 11: t1_mpr_tra · axial · 2.0mm · 0.45mm/px · z∈[-65,+65]mm · 6 of 80 slices shown]
[im 1/80]
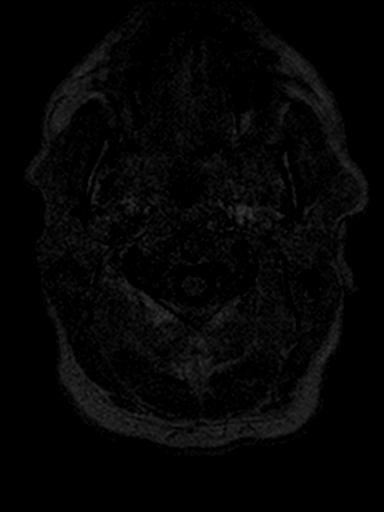
[im 14/80]
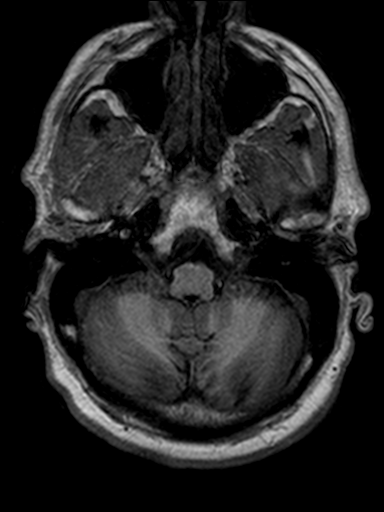
[im 27/80]
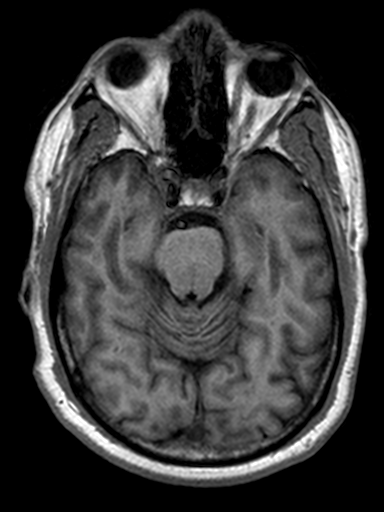
[im 40/80]
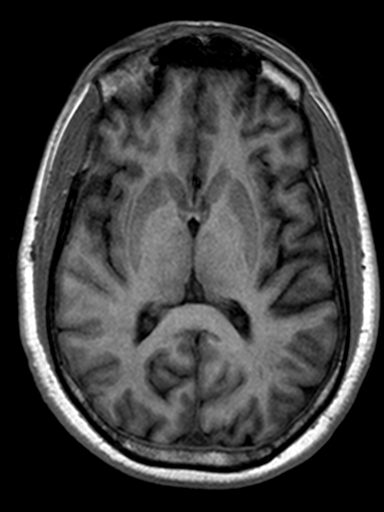
[im 53/80]
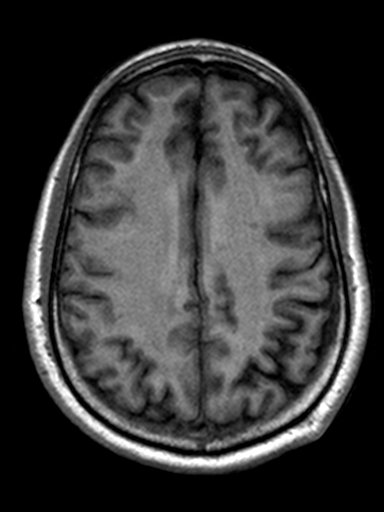
[im 66/80]
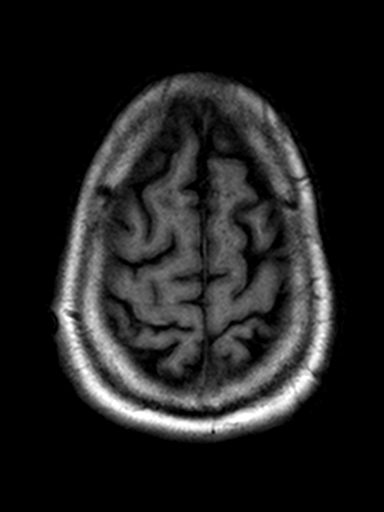

[Series 12: T2 · coronal · 5.0mm · 0.45mm/px · 2 of 27 slices shown (2 of 2)]
[im 1/27]
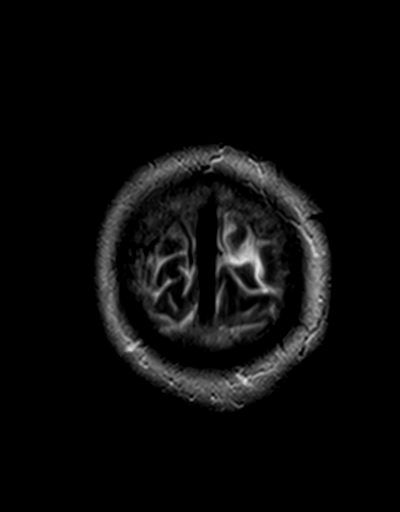
[im 27/27]
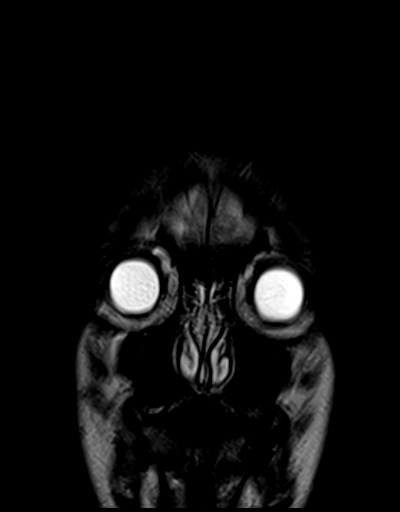

[Series 13: FLAIR · sagittal · 5.0mm · 0.45mm/px · 2 of 25 slices shown (2 of 2)]
[im 1/25]
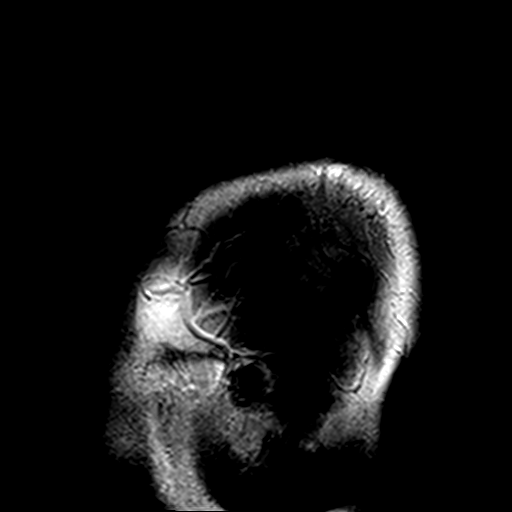
[im 25/25]
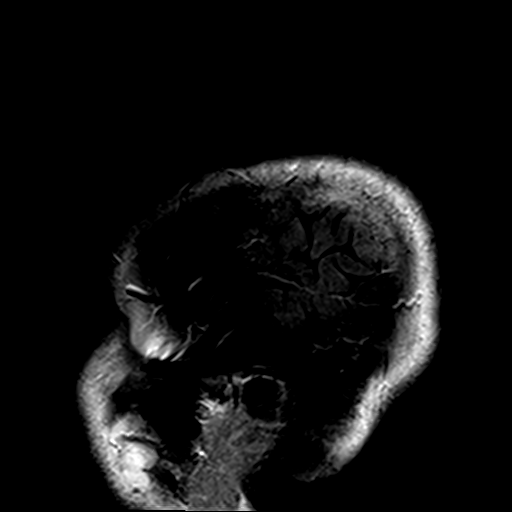

[45 of 48 positions shown; findings below may reference images not displayed]

FINDINGS: Present examination was ordered with contrast. IV was not able to be
obtained by nursing staff.

Portions of exam are motion degraded.

Brain: No acute infarct or intracranial hemorrhage.

Scattered punctate and patchy white matter changes similar to prior
exam consistent with patient's history of multiple sclerosis.
Contrast not able to be administered as noted above. None of these
areas demonstrate restricted motion to suggest acute demyelination.
No associated mass effect to suggest progressive multifocal
leukoencephalopathy.

Mild prominence posterior pituitary gland noted on multiple prior
exams dating back to 3224. Growth arrest line of the clivus
unchanged as well.

Vascular: Ectatic carotid arteries. Major intracranial vascular
structures are patent.

Skull and upper cervical spine: No acute abnormality.

Sinuses/Orbits: No acute orbital abnormality. Minimal mucosal
thickening ethmoid sinus air cells.

Other: Negative
IMPRESSION: Present examination was ordered with contrast. IV was not able to be
obtained by nursing staff.

Portions of exam are motion degraded.

Scattered punctate and patchy white matter changes similar to prior
exam consistent with patient's history of multiple sclerosis. None
of these areas demonstrate restricted motion to suggest acute
demyelination.

Remainder of findings unchanged.

## 2018-03-08 IMAGING — MR MR MRCP
8 of 9 series · 37 of 48 positions shown · non-contrast
Comparison: CT on 05/14/2014

CLINICAL DATA: Chronic right upper quadrant and epigastric pain.
Early satiety. Previous cholecystectomy.

EXAM:
MRI ABDOMEN WITHOUT CONTRAST  (INCLUDING MRCP)
TECHNIQUE: Multiplanar multisequence MR imaging of the abdomen was performed.
Heavily T2-weighted images of the biliary and pancreatic ducts were
obtained, and three-dimensional MRCP images were rendered by post
processing. Intravenous contrast could not be administered for this
exam due to lack of intravenous access.

[Series 3: T2 · axial · 5.0mm · 1.04mm/px · z∈[-64,+170]mm · 4 of 37 slices shown (1 of 3)]
[im 1/37]
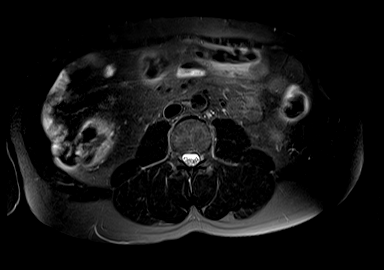
[im 13/37]
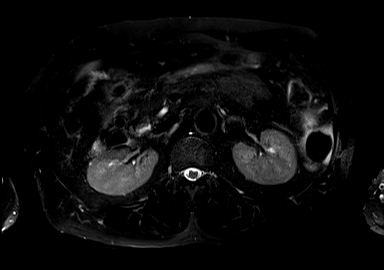
[im 25/37]
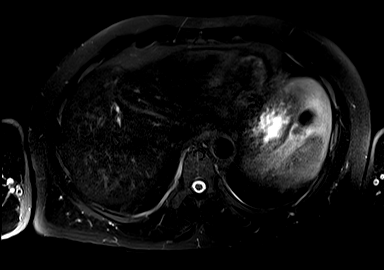
[im 37/37]
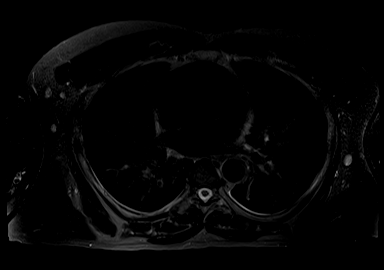

[Series 4: ep2d_diff_b50_500_800_p2_trig · axial · 5.0mm · 1.88mm/px · z∈[-72,+175]mm · 11 of 110 slices shown]
[im 1/110]
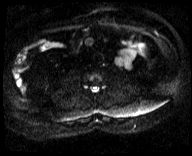
[im 11/110]
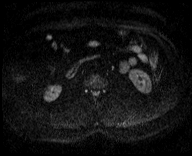
[im 22/110]
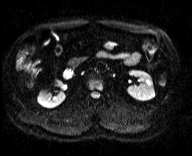
[im 33/110]
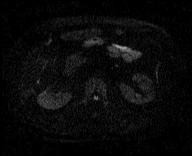
[im 44/110]
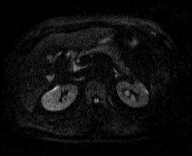
[im 55/110]
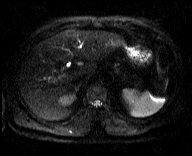
[im 66/110]
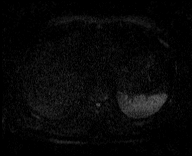
[im 77/110]
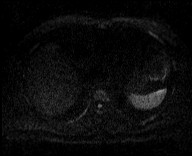
[im 88/110]
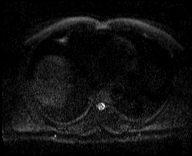
[im 99/110]
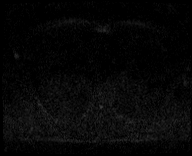
[im 110/110]
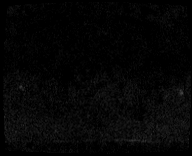

[Series 5: ep2d_diff_b50_500_800_p2_trig_adc · axial · 5.0mm · 1.88mm/px · z∈[-72,+175]mm · 4 of 39 slices shown]
[im 1/39]
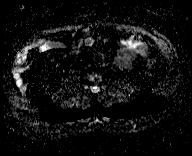
[im 13/39]
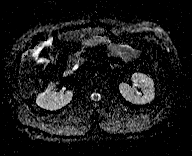
[im 26/39]
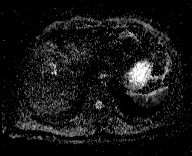
[im 39/39]
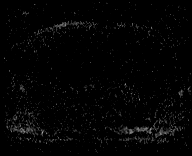

[Series 10: bSSFP · coronal · 4.0mm · 0.74mm/px · 4 of 40 slices shown]
[im 1/40]
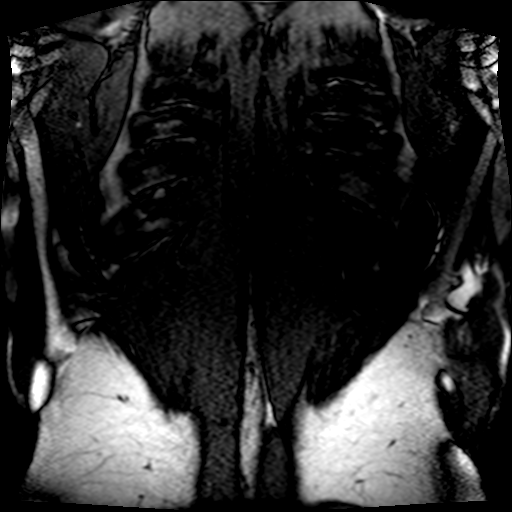
[im 14/40]
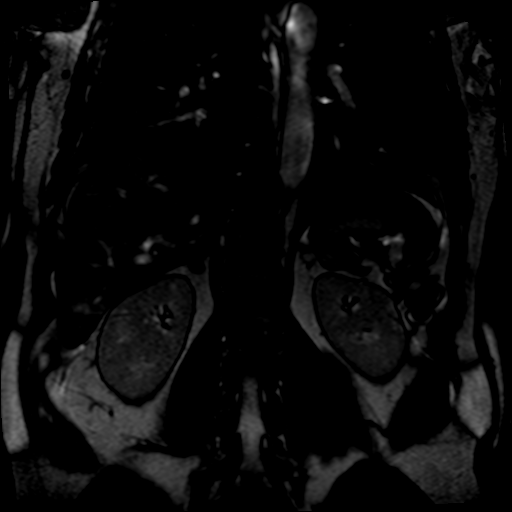
[im 27/40]
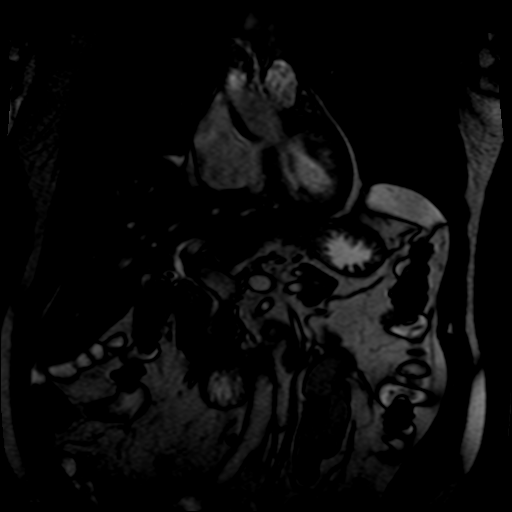
[im 40/40]
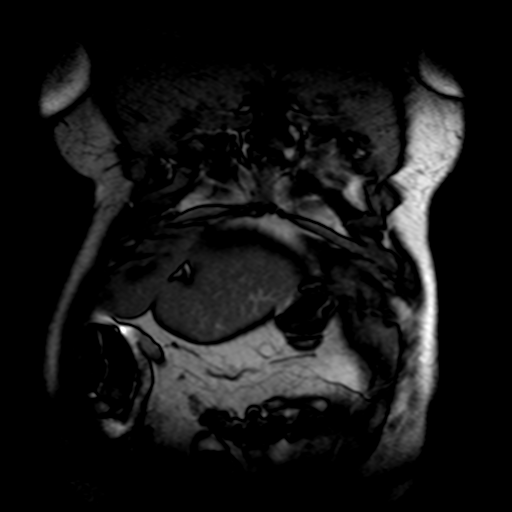

[Series 11: T2 · coronal · 3.0mm · 0.74mm/px · 3 of 32 slices shown (2 of 3)]
[im 1/32]
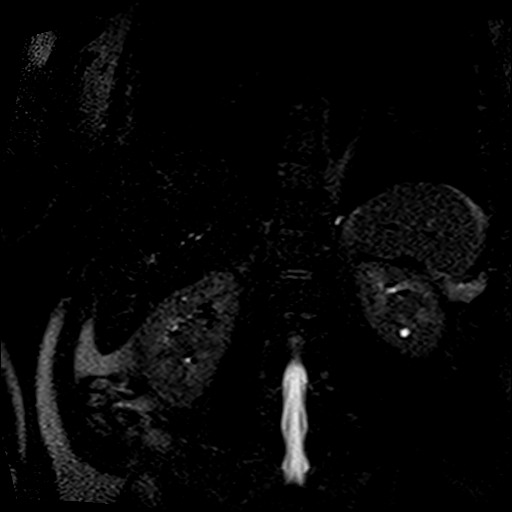
[im 16/32]
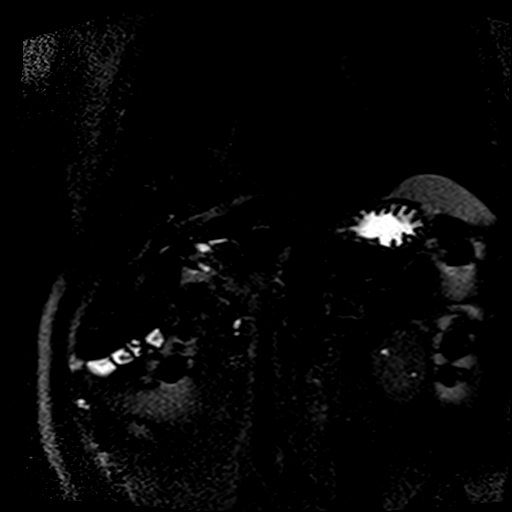
[im 32/32]
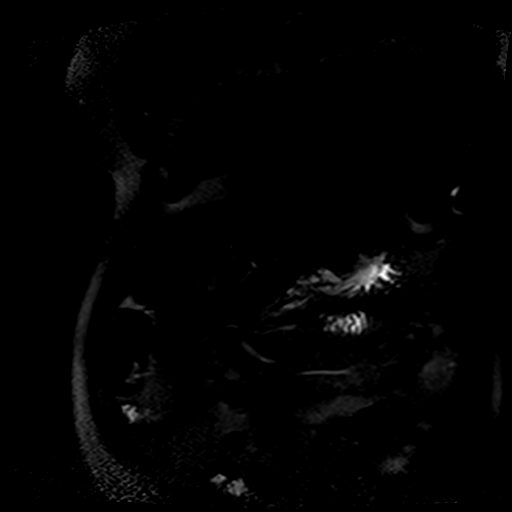

[Series 12: T1 · axial · 5.0mm · 0.78mm/px · z∈[-86,+135]mm · 7 of 70 slices shown]
[im 1/70]
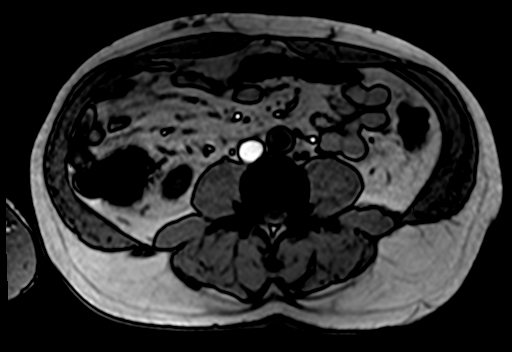
[im 12/70]
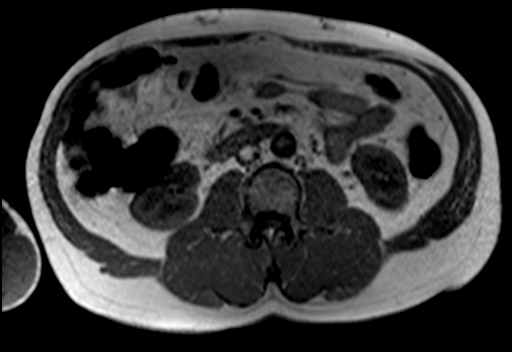
[im 24/70]
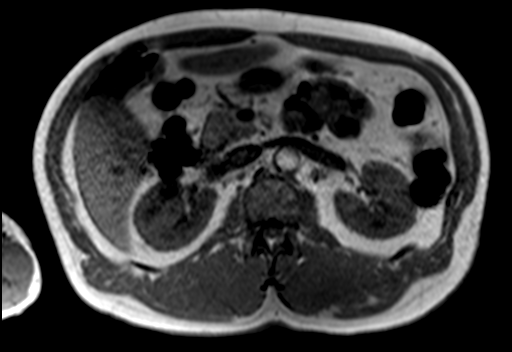
[im 35/70]
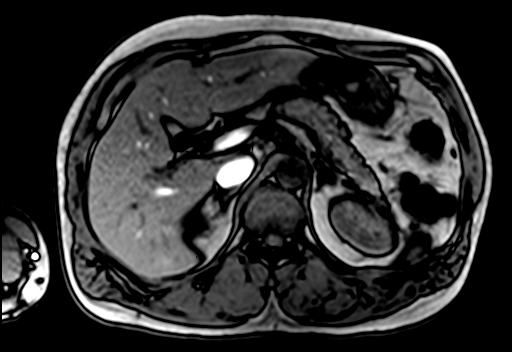
[im 47/70]
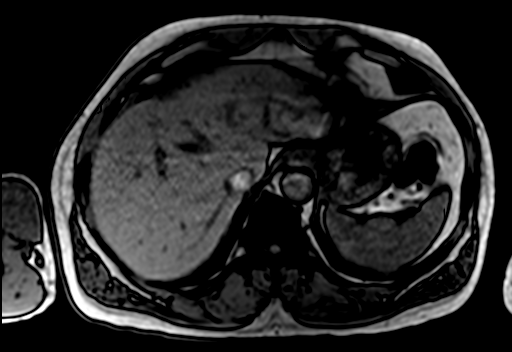
[im 58/70]
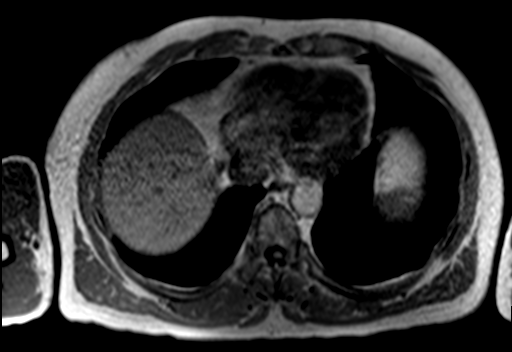
[im 70/70]
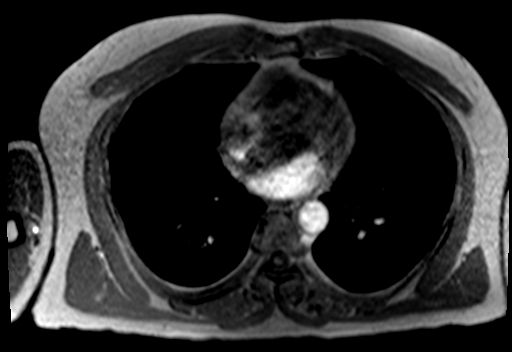

[Series 13: T2 · axial · 5.5mm · 0.70mm/px · z∈[-90,+139]mm · 3 of 33 slices shown (3 of 3)]
[im 1/33]
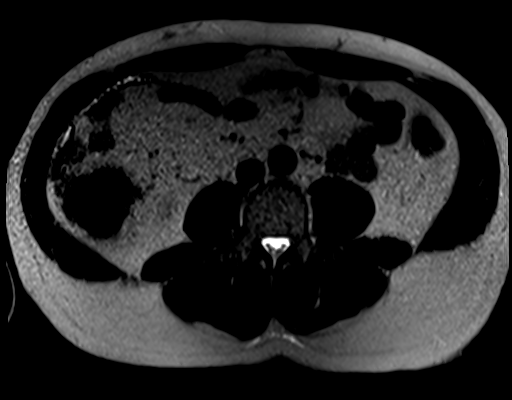
[im 17/33]
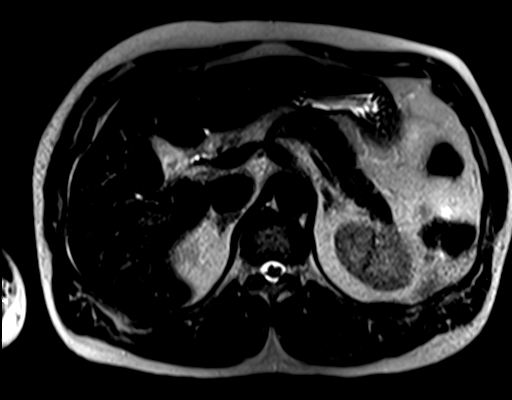
[im 33/33]
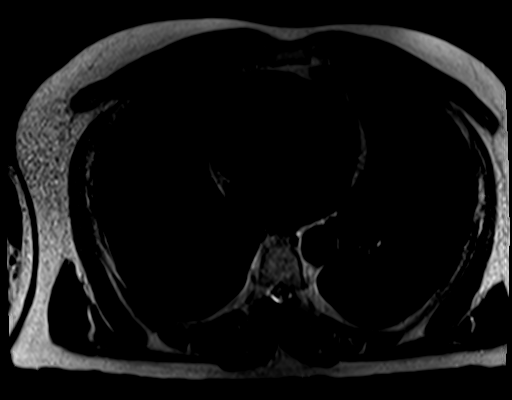

[Series 14: T1 dynamic fat-sat · axial · non-contrast · 3.0mm · 0.74mm/px · 1 of 72 slices shown]
[im 1/72]
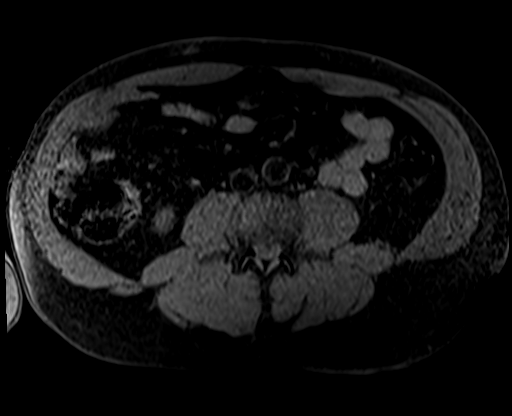

[37 of 48 positions shown; findings below may reference images not displayed]

FINDINGS: Lower chest: No acute findings.

Hepatobiliary: No masses visualized on this unenhanced exam.
Previous cholecystectomy. No evidence of biliary ductal dilatation,
with proximal common bile duct measuring 6 mm in diameter. No
evidence of choledocholithiasis.

Pancreas: No mass or inflammatory process visualized on this
unenhanced exam. No evidence of pancreatic ductal dilatation or
pancreas divisum.

Spleen:  Within normal limits in size.

Adrenals/Urinary tract: Normal adrenal glands. Tiny bilateral renal
cysts. No evidence of renal masses on this unenhanced exam. No
evidence of hydronephrosis.

Stomach/Bowel: No evidence of obstruction, inflammatory process, or
abnormal fluid collections.

Vascular/Lymphatic: No pathologically enlarged lymph nodes
identified. No evidence of abdominal aortic aneurysm.

Other:  None.

Musculoskeletal:  No suspicious bone lesions identified.
IMPRESSION: Previous cholecystectomy. No evidence of biliary ductal dilatation,
choledocholithiasis, or other acute findings.

## 2018-03-16 ENCOUNTER — Ambulatory Visit: Payer: 59 | Attending: Psychiatry | Admitting: Speech Pathology

## 2018-03-17 ENCOUNTER — Inpatient Hospital Stay (HOSPITAL_COMMUNITY)
Admission: EM | Admit: 2018-03-17 | Discharge: 2018-04-09 | DRG: 917 | Disposition: A | Discharge status: Home / Self Care | Payer: 59 | Attending: Internal Medicine | Admitting: Internal Medicine

## 2018-03-17 ENCOUNTER — Other Ambulatory Visit: Payer: Self-pay

## 2018-03-17 ENCOUNTER — Encounter (HOSPITAL_COMMUNITY): Payer: Self-pay | Admitting: Emergency Medicine

## 2018-03-17 DIAGNOSIS — T391X1A Poisoning by 4-Aminophenol derivatives, accidental (unintentional), initial encounter: Secondary | ICD-10-CM

## 2018-03-17 DIAGNOSIS — R52 Pain, unspecified: Secondary | ICD-10-CM

## 2018-03-17 DIAGNOSIS — T391X2A Poisoning by 4-Aminophenol derivatives, intentional self-harm, initial encounter: Principal | ICD-10-CM

## 2018-03-17 DIAGNOSIS — Z993 Dependence on wheelchair: Secondary | ICD-10-CM

## 2018-03-17 DIAGNOSIS — F339 Major depressive disorder, recurrent, unspecified: Secondary | ICD-10-CM | POA: Diagnosis present

## 2018-03-17 DIAGNOSIS — Z809 Family history of malignant neoplasm, unspecified: Secondary | ICD-10-CM

## 2018-03-17 DIAGNOSIS — R509 Fever, unspecified: Secondary | ICD-10-CM

## 2018-03-17 DIAGNOSIS — T40602A Poisoning by unspecified narcotics, intentional self-harm, initial encounter: Secondary | ICD-10-CM

## 2018-03-17 DIAGNOSIS — G43909 Migraine, unspecified, not intractable, without status migrainosus: Secondary | ICD-10-CM | POA: Diagnosis present

## 2018-03-17 DIAGNOSIS — G92 Toxic encephalopathy: Secondary | ICD-10-CM | POA: Diagnosis present

## 2018-03-17 DIAGNOSIS — I82409 Acute embolism and thrombosis of unspecified deep veins of unspecified lower extremity: Secondary | ICD-10-CM | POA: Diagnosis present

## 2018-03-17 DIAGNOSIS — G500 Trigeminal neuralgia: Secondary | ICD-10-CM | POA: Diagnosis present

## 2018-03-17 DIAGNOSIS — Z86711 Personal history of pulmonary embolism: Secondary | ICD-10-CM

## 2018-03-17 DIAGNOSIS — Z9049 Acquired absence of other specified parts of digestive tract: Secondary | ICD-10-CM

## 2018-03-17 DIAGNOSIS — Z883 Allergy status to other anti-infective agents status: Secondary | ICD-10-CM

## 2018-03-17 DIAGNOSIS — Z87891 Personal history of nicotine dependence: Secondary | ICD-10-CM

## 2018-03-17 DIAGNOSIS — I10 Essential (primary) hypertension: Secondary | ICD-10-CM | POA: Diagnosis present

## 2018-03-17 DIAGNOSIS — Z86718 Personal history of other venous thrombosis and embolism: Secondary | ICD-10-CM

## 2018-03-17 DIAGNOSIS — G4700 Insomnia, unspecified: Secondary | ICD-10-CM | POA: Diagnosis present

## 2018-03-17 DIAGNOSIS — Z7901 Long term (current) use of anticoagulants: Secondary | ICD-10-CM

## 2018-03-17 DIAGNOSIS — E876 Hypokalemia: Secondary | ICD-10-CM | POA: Diagnosis present

## 2018-03-17 DIAGNOSIS — R739 Hyperglycemia, unspecified: Secondary | ICD-10-CM | POA: Diagnosis present

## 2018-03-17 DIAGNOSIS — G894 Chronic pain syndrome: Secondary | ICD-10-CM | POA: Diagnosis present

## 2018-03-17 DIAGNOSIS — T50901A Poisoning by unspecified drugs, medicaments and biological substances, accidental (unintentional), initial encounter: Secondary | ICD-10-CM

## 2018-03-17 DIAGNOSIS — Z833 Family history of diabetes mellitus: Secondary | ICD-10-CM

## 2018-03-17 DIAGNOSIS — T1491XA Suicide attempt, initial encounter: Secondary | ICD-10-CM

## 2018-03-17 DIAGNOSIS — K219 Gastro-esophageal reflux disease without esophagitis: Secondary | ICD-10-CM | POA: Diagnosis present

## 2018-03-17 DIAGNOSIS — F419 Anxiety disorder, unspecified: Secondary | ICD-10-CM | POA: Diagnosis present

## 2018-03-17 DIAGNOSIS — J181 Lobar pneumonia, unspecified organism: Secondary | ICD-10-CM | POA: Diagnosis not present

## 2018-03-17 DIAGNOSIS — G35 Multiple sclerosis: Secondary | ICD-10-CM | POA: Diagnosis present

## 2018-03-17 LAB — CBC WITH DIFFERENTIAL/PLATELET
BASOS ABS: 0 10*3/uL (ref 0.0–0.1)
Basophils Relative: 0 %
EOS PCT: 0 %
Eosinophils Absolute: 0.1 10*3/uL (ref 0.0–0.7)
HEMATOCRIT: 40.9 % (ref 39.0–52.0)
Hemoglobin: 12.9 g/dL — ABNORMAL LOW (ref 13.0–17.0)
Lymphocytes Relative: 7 %
Lymphs Abs: 1.1 10*3/uL (ref 0.7–4.0)
MCH: 28.3 pg (ref 26.0–34.0)
MCHC: 31.5 g/dL (ref 30.0–36.0)
MCV: 89.7 fL (ref 78.0–100.0)
MONO ABS: 1.1 10*3/uL — AB (ref 0.1–1.0)
Monocytes Relative: 7 %
NEUTROS ABS: 13.2 10*3/uL — AB (ref 1.7–7.7)
Neutrophils Relative %: 86 %
Platelets: 260 10*3/uL (ref 150–400)
RBC: 4.56 MIL/uL (ref 4.22–5.81)
RDW: 16 % — AB (ref 11.5–15.5)
WBC: 15.3 10*3/uL — AB (ref 4.0–10.5)

## 2018-03-17 LAB — COMPREHENSIVE METABOLIC PANEL
ALK PHOS: 98 U/L (ref 38–126)
ALT: 15 U/L — AB (ref 17–63)
AST: 19 U/L (ref 15–41)
Albumin: 3.6 g/dL (ref 3.5–5.0)
Anion gap: 8 (ref 5–15)
BUN: 6 mg/dL (ref 6–20)
CALCIUM: 8.9 mg/dL (ref 8.9–10.3)
CHLORIDE: 115 mmol/L — AB (ref 101–111)
CO2: 21 mmol/L — AB (ref 22–32)
CREATININE: 1 mg/dL (ref 0.61–1.24)
Glucose, Bld: 220 mg/dL — ABNORMAL HIGH (ref 65–99)
Potassium: 3.3 mmol/L — ABNORMAL LOW (ref 3.5–5.1)
Sodium: 144 mmol/L (ref 135–145)
Total Bilirubin: 0.3 mg/dL (ref 0.3–1.2)
Total Protein: 7 g/dL (ref 6.5–8.1)

## 2018-03-17 LAB — ACETAMINOPHEN LEVEL: ACETAMINOPHEN (TYLENOL), SERUM: 136 ug/mL — AB (ref 10–30)

## 2018-03-17 LAB — SALICYLATE LEVEL: Salicylate Lvl: <7 mg/dL (ref 2.8–30.0)

## 2018-03-17 MED ORDER — NALOXONE HCL 4 MG/10ML IJ SOLN
0.5000 mg/h | INTRAMUSCULAR | Status: DC
  Administered 2018-03-17: 0.5 mg/h via INTRAVENOUS
  Administered 2018-03-18: 0.2 mg/h via INTRAVENOUS
  Administered 2018-03-18: 0.5 mg/h via INTRAVENOUS
  Filled 2018-03-17 (×3): qty 10

## 2018-03-17 NOTE — ED Triage Notes (Signed)
Pt to ED via [facility] with possible overdose.  On arrival to ED pt's resp. Were agonal with nasal airway in place.  Pt was given Narcan (nasal) by EMS and would open eyes then go back to sleep.  Dr. Rexford Maus started an ultrasound IV in Left ac and pt was given 0.4mg  of Narcan IV.   Pt then woke up.  Pt st's he took 15 tabs of oxycodone

## 2018-03-17 NOTE — H&P (Signed)
PULMONARY / CRITICAL CARE MEDICINE   Name: [name] MRN: [id number] DOB: 15-Dec-1960    ADMISSION DATE:  03/17/2018 CONSULTATION DATE:  5/8  REFERRING MD:  Dr. Jonn Shingles EDP  CHIEF COMPLAINT:  AMS  HISTORY OF PRESENT ILLNESS:   57 year old male with PMH as below, which is significant for MS, Chronic pain, Depression, DVT, and gait abnormality. His family noted him to be minimally responsive on the evening hours of 5/8 and called EMS. Upon their arrival the patient had agonal respirations, but was pulsatile. Responded to intranasal narcan. He remained somnolent upon arrival to ED and was given IV narcan. He responded very well and was started on infusion. Patient reports taking 15 tablets of Percocet 10-325. He claims overdose was accidental and he had no intention of harming himself. Currently denies SI. He claims he also took tylenol and ASA. Smoker. Denies drinking and illegal drug use. PCCM asked to admit for Narcan infusion.  PAST MEDICAL HISTORY :  He  has a past medical history of Abdominal pain, unspecified site, Anxiety, Arthritis, Benign paroxysmal positional vertigo, Cellulitis and abscess of right leg (04/2017), Chronic back pain, Chronic pain syndrome (01/25/2008), Cluster headache, Depression, DVT (deep venous thrombosis) (HCC), Gait abnormality, Gallstones (02/17/2009), GERD (gastroesophageal reflux disease), Headache(784.0), History of colonoscopy, HTN (hypertension), Insomnia (11/06/2008), Joint pain, Joint swelling, Memory loss, Multiple sclerosis (HCC), Nonspecific elevation of levels of transaminase or lactic acid dehydrogenase (LDH), Other specified visual disturbances, Other syndromes affecting cervical region, Pneumonia (2009), and Trigeminal neuralgia.  PAST SURGICAL HISTORY: He  has a past surgical history that includes Cholecystectomy (02/20/2009); Portacath placement (N/A, 03/27/2014); Port a cath revision (N/A, 07/06/2015); and Colonoscopy with propofol (N/A,  03/17/2016).  No Known Allergies  Current Facility-Administered Medications on File Prior to Encounter  Medication  . sodium chloride 0.9 % injection 10 mL   Current Outpatient Medications on File Prior to Encounter  Medication Sig  . baclofen (LIORESAL) 20 MG tablet Take 1 tablet (20 mg total) by mouth 4 (four) times daily. For muscle spasms (Patient taking differently: Take 20 mg by mouth 4 (four) times daily. 10a, 2pm, 10p - For muscle spasms)  . dalfampridine 10 MG TB12 Take 1 tablet (10 mg total) by mouth 2 (two) times daily. For MS (Patient taking differently: Take 10 mg by mouth 2 (two) times daily. "Ampyra" 10a and 10p -For MS)  . doxycycline (VIBRAMYCIN) 100 MG capsule Take 1 capsule (100 mg total) by mouth 2 (two) times daily.  . Eszopiclone 3 MG TABS Take 3 mg by mouth at bedtime.  . gabapentin (NEURONTIN) 600 MG tablet Take 600 mg by mouth 4 (four) times daily. 10am, 2pm, 6pm, 10pm  . hydrochlorothiazide (HYDRODIURIL) 25 MG tablet Take 25 mg by mouth daily.  [name] levETIRAcetam (KEPPRA) 500 MG tablet Take 500 mg by mouth 2 (two) times daily.  [name] lisinopril-hydrochlorothiazide (PRINZIDE,ZESTORETIC) 10-12.5 MG tablet Take 1 tablet by mouth daily.  Fran Lowes (OCREVUS IV) Inject into the vein every 6 (six) months.  [name] omeprazole (PRILOSEC) 40 MG capsule Take 1 capsule (40 mg total) by mouth every evening. For acid reflux  . Oxcarbazepine (TRILEPTAL) 300 MG tablet Take 1 tablet (300 mg total) by mouth 3 (three) times daily. For mood stabilization  . oxyCODONE-acetaminophen (PERCOCET) 10-325 MG tablet Take 1 tablet by mouth every 4 (four) hours as needed for pain. (Patient taking differently: Take 1 tablet by mouth every 4 (four) hours. )  . sertraline (ZOLOFT) 50 MG tablet Take 1 tablet (50  mg total) by mouth daily. For depression  . topiramate (TOPAMAX) 25 MG tablet Take 25 mg by mouth 2 (two) times daily. 4pm  . traZODone (DESYREL) 100 MG tablet Take 100 mg by mouth at bedtime as needed  for sleep.   [name] triamterene-hydrochlorothiazide (DYAZIDE) 37.5-25 MG capsule Take 1 each (1 capsule total) by mouth daily. For high blood pressure  . verapamil (CALAN) 80 MG tablet Take 1 tablet (80 mg total) by mouth 2 (two) times daily. For high blood pressure (Patient taking differently: Take 80 mg by mouth See admin instructions. Take one tablet (80 mg) by mouth twice daily - 2pm and 10pm. For high blood pressure)  . XARELTO 10 MG TABS tablet Take 1 tablet (10 mg total) by mouth daily. For blood clot prevention (Patient taking differently: Take 10 mg by mouth every evening. For blood clot prevention)    FAMILY HISTORY:  His indicated that his mother is alive. He indicated that his father is deceased.   SOCIAL HISTORY: He  reports that he has quit smoking. His smoking use included cigarettes. He has a 38.00 pack-year smoking history. He has never used smokeless tobacco. He reports that he drinks alcohol. He reports that he does not use drugs.  REVIEW OF SYSTEMS:  Limited due to encephalopathy Bolds are positive  Constitutional: weight loss, gain, night sweats, Fevers, chills, fatigue .  HEENT: headaches, Sore throat, sneezing, nasal congestion, post nasal drip, Difficulty swallowing, Tooth/dental problems, visual complaints visual changes, ear ache CV:  chest pain, radiates:,Orthopnea, PND, swelling in lower extremities, dizziness, palpitations, syncope.  GI  heartburn, indigestion, abdominal pain, nausea, vomiting, diarrhea, change in bowel habits, loss of appetite, bloody stools.  Resp: cough, productive:, hemoptysis, dyspnea, chest pain, pleuritic.  Skin: rash or itching or icterus GU: dysuria, change in color of urine, urgency or frequency. flank pain, hematuria  MS: joint pain or swelling. decreased range of motion  Psych: change in mood or affect. depression or anxiety.  Neuro: difficulty with speech, weakness, numbness, ataxia    SUBJECTIVE:    VITAL SIGNS: BP 112/69   Pulse  64   Temp (!) 97.5 F (36.4 C) (Oral)   Resp 10   Ht 5\' 9"  (1.753 m)   Wt 78.5 kg (173 lb)   SpO2 96%   BMI 25.55 kg/m   HEMODYNAMICS:    VENTILATOR SETTINGS:    INTAKE / OUTPUT: No intake/output data recorded.  PHYSICAL EXAMINATION: General:  Middle aged male in NAD Neuro:  Somnolent. Arouses to verbal. Speech a little slurred but answers questions appropriately. Good strength.  HEENT:  [place]/AT, PERRL, no JVD Cardiovascular:  RRR, no MRG Lungs:  Clear bilateral breath sounds Abdomen:  Soft, non-tender, non-distended Musculoskeletal:  No acute deformity or ROM limitation Skin:  Grossly intact.   LABS:  BMET Recent Labs  Lab 03/17/18 1952  NA 144  K 3.3*  CL 115*  CO2 21*  BUN 6  CREATININE 1.00  GLUCOSE 220*    Electrolytes Recent Labs  Lab 03/17/18 1952  CALCIUM 8.9    CBC Recent Labs  Lab 03/17/18 1952  WBC 15.3*  HGB 12.9*  HCT 40.9  PLT 260    Coag's No results for input(s): APTT, INR in the last 168 hours.  Sepsis Markers No results for input(s): LATICACIDVEN, PROCALCITON, O2SATVEN in the last 168 hours.  ABG No results for input(s): PHART, PCO2ART, PO2ART in the last 168 hours.  Liver Enzymes Recent Labs  Lab 03/17/18 1952  AST 19  ALT 15*  ALKPHOS 98  BILITOT 0.3  ALBUMIN 3.6    Cardiac Enzymes No results for input(s): TROPONINI, PROBNP in the last 168 hours.  Glucose No results for input(s): GLUCAP in the last 168 hours.  Imaging No results found.   STUDIES:  CT head 5/9 > no acute abnormality.   CULTURES:   ANTIBIOTICS:   SIGNIFICANT EVENTS: 5/9 admitted for overdose  LINES/TUBES:   DISCUSSION: 57 year old male with chronic pain and MS admitted 5/9 after overdose of percocet requiring narcan infusion and NAC. Taken to ICU for close monitoring and frequent labs.  ASSESSMENT / PLAN:  Acute toxic encephalopathy secondary to opioid/tylenol overdose - Continue narcan infusion - Hold sedating drugs -  Trend LFT  (wnl on admit), Tylenol levels (138 on admit). ASA negative - Start NAC IV per pharmacy - NPO - Pepcid  MS Chronic pain - Holding home trazodone, gabapentin, baclofen, dalfampridine, Topamax, oxcarbazepine until taking PO - Keppra to IV  Hypokalemia - Replace K  Hyperglycemia without history or DM - CBG monitoring and SSI  Hypertension history - Holding home HCTZ, verapamil, while NPO  Hx DVT on chronic anticoagulation - Start heparin infusion in AM if unable to restart Xarelto  Dispo ICU Code FULL SUP Pepcid VTE sq heparin Diet NPO  Joneen Roach, AGACNP-BC [facility] Pulmonology/Critical Care Pager [phone] or [phone]  03/18/2018 12:28 AM

## 2018-03-17 NOTE — ED Provider Notes (Signed)
[facility] EMERGENCY DEPARTMENT Provider Note   CSN: [id number] Arrival date & time: 03/17/18  1914     History   Chief Complaint Chief Complaint  Patient presents with  . Drug Overdose    HPI [name] is a 57 y.o. male.  HPI  Patient is a 57 year old male with a past medical history of major depression who comes in today after EMS was called by unknown party.  Per EMS, on arrival patient was somnolent.  Patient was given Narcan with appropriate response and was alert, however patient continued to decline in route and became somnolent with decreased respiratory drive.  On arrival.  Patient with bradypnea but is arousable to vigorous sternal rub.  IV placed and patient was given additional dose of Narcan.  Patient states that he took 15 tabs of 10 mg Vicodin.  On further questioning patient admitted that he was intentionally overdosing and attempt to end his life.  Past Medical History:  Diagnosis Date  . Abdominal pain, unspecified site   . Anxiety   . Arthritis   . Benign paroxysmal positional vertigo   . Cellulitis and abscess of right leg 04/2017  . Chronic back pain   . Chronic pain syndrome 01/25/2008  . Cluster headache   . Depression    takes Zoloft daily  . DVT (deep venous thrombosis) (HCC)    in the left arm '09  . Gait abnormality    "uses mobile wheelchair, but is ambulatory"  . Gallstones 02/17/2009   resolved after gallbladder surgery.  [name] GERD (gastroesophageal reflux disease)    takes Omeprazole as needed  . Headache(784.0)    cluster headaches frequently-takes Topamax daily  . History of colonoscopy   . HTN (hypertension)    takes Lisinopril,Verapamil,and Triamterene HCTZ daily  . Insomnia 11/06/2008  . Joint pain   . Joint swelling    03-07-16 "swelling of right wrist" "after a fall-xray done 03-06-16 "no fractures".  . Memory loss    no an issue at present 03-07-16  . Multiple sclerosis (HCC)    Dx. 2005 - Dr. Tinnie Gens  follows LOV 4'17 tx. Tysabri monthly IV-[facility] , [place].  [name] Nonspecific elevation of levels of transaminase or lactic acid dehydrogenase (LDH)   . Other specified visual disturbances   . Other syndromes affecting cervical region   . Pneumonia 2009  . Trigeminal neuralgia     history" Multiple sclerosis"    Patient Active Problem List   Diagnosis Date Noted  . Severe major depression, single episode, with psychotic features (HCC) 10/04/2017  . Weakness   . Cellulitis of right leg 05/07/2017  . Right carpal tunnel syndrome 02/16/2017  . Gait disturbance 01/22/2017  . Insomnia 01/22/2017  . Depression with anxiety 01/22/2017  . High risk medication use 01/22/2017  . Major depressive disorder, recurrent episode (HCC) 12/19/2016  . Suicidal ideation   . AKI (acute kidney injury) (HCC) 11/15/2016  . Chronic hepatitis C virus infection (HCC) 11/15/2016  . Chronic abdominal pain 11/15/2016  . Chronic pain syndrome 11/15/2016  . Acute retention of urine 11/15/2016  . Generalized abdominal pain   . Chronic cluster headache, not intractable   . Community acquired pneumonia   . TB lung, latent   . HCAP (healthcare-associated pneumonia) 02/16/2016  . Hypokalemia 02/16/2016  . Intractable cluster headache syndrome   . DVT (deep venous thrombosis) (HCC)   . Depression   . GERD (gastroesophageal reflux disease)   . HTN (hypertension)   .  Essential hypertension   . Gastroesophageal reflux disease without esophagitis   . CAP (community acquired pneumonia)   . Multiple sclerosis (HCC) 08/02/2013  . ABDOMINAL BLOATING 10/14/2010  . LOOSE STOOLS 10/14/2010  . PULMONARY EMBOLISM, HX OF 10/14/2010    Past Surgical History:  Procedure Laterality Date  . CHOLECYSTECTOMY  02/20/2009  . COLONOSCOPY WITH PROPOFOL N/A 03/17/2016   Procedure: COLONOSCOPY WITH PROPOFOL;  Surgeon: [name], MD;  Location: [facility] ENDOSCOPY;  Service: Endoscopy;  Laterality: N/A;  . PORT A CATH  REVISION N/A 07/06/2015   Procedure: Removal and replacement of PORT A CATH;  Surgeon: [name], MD;  Location[facility] OR;  Service: General;  Laterality: N/A;  . PORTACATH PLACEMENT N/A 03/27/2014   Procedure: INSERTION PORT-A-CATH;  Surgeon: [name], MD;  Location: [facility] OR;  Service: General;  Laterality: N/A;        Home Medications    Prior to Admission medications   Medication Sig Start Date End Date Taking? Authorizing Provider  baclofen (LIORESAL) 20 MG tablet Take 1 tablet (20 mg total) by mouth 4 (four) times daily. For muscle spasms Patient taking differently: Take 20 mg by mouth 4 (four) times daily. 10a, 2pm, 10p - For muscle spasms 10/09/17   Armandina Stammer I, NP  dalfampridine 10 MG TB12 Take 1 tablet (10 mg total) by mouth 2 (two) times daily. For MS Patient taking differently: Take 10 mg by mouth 2 (two) times daily. "Ampyra" 10a and 10p -For MS 10/09/17   Armandina Stammer I, NP  doxycycline (VIBRAMYCIN) 100 MG capsule Take 1 capsule (100 mg total) by mouth 2 (two) times daily. 03/04/18   Renne Crigler, PA-C  Eszopiclone 3 MG TABS Take 3 mg by mouth at bedtime.    [provider]  gabapentin (NEURONTIN) 600 MG tablet Take 600 mg by mouth 4 (four) times daily. 10am, 2pm, 6pm, 10pm 10/25/17   [provider]  hydrochlorothiazide (HYDRODIURIL) 25 MG tablet Take 25 mg by mouth daily.    [provider]  levETIRAcetam (KEPPRA) 500 MG tablet Take 500 mg by mouth 2 (two) times daily.    [provider]  lisinopril-hydrochlorothiazide (PRINZIDE,ZESTORETIC) 10-12.5 MG tablet Take 1 tablet by mouth daily.    [provider]  Ocrelizumab (OCREVUS IV) Inject into the vein every 6 (six) months.    [provider]  omeprazole (PRILOSEC) 40 MG capsule Take 1 capsule (40 mg total) by mouth every evening. For acid reflux 10/09/17   Armandina Stammer I, NP  Oxcarbazepine (TRILEPTAL) 300 MG tablet Take 1 tablet (300 mg total) by mouth 3 (three)  times daily. For mood stabilization 10/09/17   Armandina Stammer I, NP  oxyCODONE-acetaminophen (PERCOCET) 10-325 MG tablet Take 1 tablet by mouth every 4 (four) hours as needed for pain. Patient taking differently: Take 1 tablet by mouth every 4 (four) hours.  10/09/17   Armandina Stammer I, NP  sertraline (ZOLOFT) 50 MG tablet Take 1 tablet (50 mg total) by mouth daily. For depression 10/10/17   Armandina Stammer I, NP  topiramate (TOPAMAX) 25 MG tablet Take 25 mg by mouth 2 (two) times daily. 4pm 11/13/17   [provider]  traZODone (DESYREL) 100 MG tablet Take 100 mg by mouth at bedtime as needed for sleep.  12/16/17   [provider]  triamterene-hydrochlorothiazide (DYAZIDE) 37.5-25 MG capsule Take 1 each (1 capsule total) by mouth daily. For high blood pressure 10/09/17   Nwoko, Nicole Kindred I, NP  verapamil (CALAN) 80 MG  tablet Take 1 tablet (80 mg total) by mouth 2 (two) times daily. For high blood pressure Patient taking differently: Take 80 mg by mouth See admin instructions. Take one tablet (80 mg) by mouth twice daily - 2pm and 10pm. For high blood pressure 10/09/17   Nwoko, Agnes I, NP  XARELTO 10 MG TABS tablet Take 1 tablet (10 mg total) by mouth daily. For blood clot prevention Patient taking differently: Take 10 mg by mouth every evening. For blood clot prevention 10/09/17   Sanjuana Kava, NP    Family History Family History  Problem Relation Age of Onset  . Cancer Father   . Diabetes Mother     Social History Social History   Tobacco Use  . Smoking status: Former Smoker    Packs/day: 1.00    Years: 38.00    Pack years: 38.00    Types: Cigarettes  . Smokeless tobacco: Never Used  . Tobacco comment: cutting back  Substance Use Topics  . Alcohol use: Yes    Alcohol/week: 0.0 oz    Comment: occasional  . Drug use: No    Comment: Quit 2011     Allergies   Patient has no known allergies.   Review of Systems Review of Systems  Constitutional: Negative for fever.    Cardiovascular: Negative for chest pain.  Gastrointestinal: Negative for abdominal pain.  Neurological: Negative for weakness and numbness.  Psychiatric/Behavioral: Positive for self-injury and suicidal ideas.  All other systems reviewed and are negative.    Physical Exam Updated Vital Signs BP 112/69   Pulse 64   Temp (!) 97.5 F (36.4 C) (Oral)   Resp 10   Ht 5\' 9"  (1.753 m)   Wt 78.5 kg (173 lb)   SpO2 96%   BMI 25.55 kg/m   Physical Exam  Constitutional: He appears well-developed and well-nourished.  HENT:  Head: Normocephalic and atraumatic.  Eyes: Pupils are equal, round, and reactive to light. Conjunctivae and EOM are normal.  Neck: Neck supple.  Cardiovascular: Normal rate and regular rhythm.  No murmur heard. Pulmonary/Chest: Breath sounds normal. No respiratory distress.  Bradypnea on arrival with return to normal range on Narcan administration.  Abdominal: Soft. There is no tenderness.  Musculoskeletal: He exhibits no edema.  Neurological: He is alert. No cranial nerve deficit or sensory deficit. He exhibits normal muscle tone.  Skin: Skin is warm and dry.  Psychiatric:  Patient somnolent but arousable initially, alert and oriented following Narcan administration.  Nursing note and vitals reviewed.    ED Treatments / Results  Labs (all labs ordered are listed, but only abnormal results are displayed) Labs Reviewed  ACETAMINOPHEN LEVEL - Abnormal; Notable for the following components:      Result Value   Acetaminophen (Tylenol), Serum 136 (*)    All other components within normal limits  COMPREHENSIVE METABOLIC PANEL - Abnormal; Notable for the following components:   Potassium 3.3 (*)    Chloride 115 (*)    CO2 21 (*)    Glucose, Bld 220 (*)    ALT 15 (*)    All other components within normal limits  CBC WITH DIFFERENTIAL/PLATELET - Abnormal; Notable for the following components:   WBC 15.3 (*)    Hemoglobin 12.9 (*)    RDW 16.0 (*)    Neutro  Abs 13.2 (*)    Monocytes Absolute 1.1 (*)    All other components within normal limits  SALICYLATE LEVEL  BLOOD GAS, VENOUS    EKG  None  Radiology No results found.  Procedures Procedures (including critical care time)  Medications Ordered in ED Medications  naloxone HCl (NARCAN) 4 mg in dextrose 5 % 250 mL infusion (0.5 mg/hr Intravenous New Bag/Given 03/17/18 1958)     Initial Impression / Assessment and Plan / ED Course  I have reviewed the triage vital signs and the nursing notes.  Pertinent labs & imaging results that were available during my care of the patient were reviewed by me and considered in my medical decision making (see chart for details).     Patient placed on Narcan drip.  Laboratory work-up shows elevated acetaminophen level.  Patient given N-acetylcysteine.  Labs drawn approximately 4 hours postingestion per patient's report.  Will admit patient to ICU given Narcan drip requirement.  Final Clinical Impressions(s) / ED Diagnoses   Final diagnoses:  Intentional acetaminophen overdose, initial encounter [facility])  Intentional opiate overdose, initial encounter [facility])    ED Discharge Orders    None       Caren Griffins, MD 03/18/18 0007    Melene Plan, DO 03/18/18 1556

## 2018-03-17 NOTE — ED Notes (Signed)
Pt resting quietly with eyes closed.  Will respond to verbal stimuli

## 2018-03-17 NOTE — ED Notes (Signed)
Pt remains on Narcan gtt.  Pt resting with eyes closed but will arouse easily to verbal stimuli

## 2018-03-18 DIAGNOSIS — Z809 Family history of malignant neoplasm, unspecified: Secondary | ICD-10-CM | POA: Diagnosis not present

## 2018-03-18 DIAGNOSIS — R4587 Impulsiveness: Secondary | ICD-10-CM | POA: Diagnosis not present

## 2018-03-18 DIAGNOSIS — T40601D Poisoning by unspecified narcotics, accidental (unintentional), subsequent encounter: Secondary | ICD-10-CM | POA: Diagnosis not present

## 2018-03-18 DIAGNOSIS — T391X2A Poisoning by 4-Aminophenol derivatives, intentional self-harm, initial encounter: Secondary | ICD-10-CM | POA: Insufficient documentation

## 2018-03-18 DIAGNOSIS — G894 Chronic pain syndrome: Secondary | ICD-10-CM | POA: Diagnosis not present

## 2018-03-18 DIAGNOSIS — Z9049 Acquired absence of other specified parts of digestive tract: Secondary | ICD-10-CM | POA: Diagnosis not present

## 2018-03-18 DIAGNOSIS — J181 Lobar pneumonia, unspecified organism: Secondary | ICD-10-CM | POA: Diagnosis not present

## 2018-03-18 DIAGNOSIS — Z993 Dependence on wheelchair: Secondary | ICD-10-CM | POA: Diagnosis not present

## 2018-03-18 DIAGNOSIS — G92 Toxic encephalopathy: Secondary | ICD-10-CM | POA: Diagnosis present

## 2018-03-18 DIAGNOSIS — Z833 Family history of diabetes mellitus: Secondary | ICD-10-CM | POA: Diagnosis not present

## 2018-03-18 DIAGNOSIS — G4700 Insomnia, unspecified: Secondary | ICD-10-CM | POA: Diagnosis present

## 2018-03-18 DIAGNOSIS — Z86711 Personal history of pulmonary embolism: Secondary | ICD-10-CM | POA: Diagnosis not present

## 2018-03-18 DIAGNOSIS — I10 Essential (primary) hypertension: Secondary | ICD-10-CM | POA: Diagnosis not present

## 2018-03-18 DIAGNOSIS — T50904D Poisoning by unspecified drugs, medicaments and biological substances, undetermined, subsequent encounter: Secondary | ICD-10-CM | POA: Diagnosis not present

## 2018-03-18 DIAGNOSIS — G500 Trigeminal neuralgia: Secondary | ICD-10-CM | POA: Diagnosis present

## 2018-03-18 DIAGNOSIS — Z883 Allergy status to other anti-infective agents status: Secondary | ICD-10-CM | POA: Diagnosis not present

## 2018-03-18 DIAGNOSIS — I82499 Acute embolism and thrombosis of other specified deep vein of unspecified lower extremity: Secondary | ICD-10-CM | POA: Diagnosis not present

## 2018-03-18 DIAGNOSIS — Z7901 Long term (current) use of anticoagulants: Secondary | ICD-10-CM | POA: Diagnosis not present

## 2018-03-18 DIAGNOSIS — G9340 Encephalopathy, unspecified: Secondary | ICD-10-CM

## 2018-03-18 DIAGNOSIS — G43909 Migraine, unspecified, not intractable, without status migrainosus: Secondary | ICD-10-CM | POA: Diagnosis present

## 2018-03-18 DIAGNOSIS — I959 Hypotension, unspecified: Secondary | ICD-10-CM | POA: Diagnosis not present

## 2018-03-18 DIAGNOSIS — F333 Major depressive disorder, recurrent, severe with psychotic symptoms: Secondary | ICD-10-CM | POA: Diagnosis not present

## 2018-03-18 DIAGNOSIS — T402X2A Poisoning by other opioids, intentional self-harm, initial encounter: Secondary | ICD-10-CM | POA: Diagnosis not present

## 2018-03-18 DIAGNOSIS — F419 Anxiety disorder, unspecified: Secondary | ICD-10-CM | POA: Diagnosis present

## 2018-03-18 DIAGNOSIS — T40602A Poisoning by unspecified narcotics, intentional self-harm, initial encounter: Secondary | ICD-10-CM | POA: Diagnosis present

## 2018-03-18 DIAGNOSIS — F339 Major depressive disorder, recurrent, unspecified: Secondary | ICD-10-CM | POA: Diagnosis present

## 2018-03-18 DIAGNOSIS — R739 Hyperglycemia, unspecified: Secondary | ICD-10-CM | POA: Diagnosis present

## 2018-03-18 DIAGNOSIS — K219 Gastro-esophageal reflux disease without esophagitis: Secondary | ICD-10-CM | POA: Diagnosis present

## 2018-03-18 DIAGNOSIS — T391X1A Poisoning by 4-Aminophenol derivatives, accidental (unintentional), initial encounter: Secondary | ICD-10-CM | POA: Diagnosis present

## 2018-03-18 DIAGNOSIS — G35 Multiple sclerosis: Secondary | ICD-10-CM | POA: Diagnosis present

## 2018-03-18 DIAGNOSIS — T1491XA Suicide attempt, initial encounter: Secondary | ICD-10-CM | POA: Diagnosis not present

## 2018-03-18 DIAGNOSIS — E876 Hypokalemia: Secondary | ICD-10-CM | POA: Diagnosis present

## 2018-03-18 DIAGNOSIS — F332 Major depressive disorder, recurrent severe without psychotic features: Secondary | ICD-10-CM | POA: Diagnosis not present

## 2018-03-18 DIAGNOSIS — L818 Other specified disorders of pigmentation: Secondary | ICD-10-CM | POA: Diagnosis not present

## 2018-03-18 DIAGNOSIS — F329 Major depressive disorder, single episode, unspecified: Secondary | ICD-10-CM | POA: Diagnosis not present

## 2018-03-18 DIAGNOSIS — F1290 Cannabis use, unspecified, uncomplicated: Secondary | ICD-10-CM | POA: Diagnosis not present

## 2018-03-18 DIAGNOSIS — Z87891 Personal history of nicotine dependence: Secondary | ICD-10-CM | POA: Diagnosis not present

## 2018-03-18 DIAGNOSIS — Z86718 Personal history of other venous thrombosis and embolism: Secondary | ICD-10-CM | POA: Diagnosis not present

## 2018-03-18 LAB — BASIC METABOLIC PANEL
ANION GAP: 9 (ref 5–15)
BUN: 6 mg/dL (ref 6–20)
CALCIUM: 8.8 mg/dL — AB (ref 8.9–10.3)
CHLORIDE: 114 mmol/L — AB (ref 101–111)
CO2: 22 mmol/L (ref 22–32)
Creatinine, Ser: 0.81 mg/dL (ref 0.61–1.24)
GFR calc non Af Amer: >60 mL/min (ref >60)
Glucose, Bld: 116 mg/dL — ABNORMAL HIGH (ref 65–99)
Potassium: 3.3 mmol/L — ABNORMAL LOW (ref 3.5–5.1)
Sodium: 145 mmol/L (ref 135–145)

## 2018-03-18 LAB — CBC
HEMATOCRIT: 41.3 % (ref 39.0–52.0)
HEMOGLOBIN: 12.9 g/dL — AB (ref 13.0–17.0)
MCH: 27.9 pg (ref 26.0–34.0)
MCHC: 31.2 g/dL (ref 30.0–36.0)
MCV: 89.4 fL (ref 78.0–100.0)
Platelets: 222 10*3/uL (ref 150–400)
RBC: 4.62 MIL/uL (ref 4.22–5.81)
RDW: 15.8 % — AB (ref 11.5–15.5)
WBC: 12.9 10*3/uL — AB (ref 4.0–10.5)

## 2018-03-18 LAB — ACETAMINOPHEN LEVEL

## 2018-03-18 LAB — GLUCOSE, CAPILLARY
GLUCOSE-CAPILLARY: 115 mg/dL — AB (ref 65–99)
GLUCOSE-CAPILLARY: 100 mg/dL — AB (ref 65–99)
Glucose-Capillary: 110 mg/dL — ABNORMAL HIGH (ref 65–99)
Glucose-Capillary: 86 mg/dL (ref 65–99)

## 2018-03-18 LAB — HEPATIC FUNCTION PANEL
ALBUMIN: 3.1 g/dL — AB (ref 3.5–5.0)
ALT: 50 U/L (ref 17–63)
AST: 43 U/L — ABNORMAL HIGH (ref 15–41)
Alkaline Phosphatase: 80 U/L (ref 38–126)
BILIRUBIN TOTAL: 0.8 mg/dL (ref 0.3–1.2)
Bilirubin, Direct: <0.1 mg/dL — ABNORMAL LOW (ref 0.1–0.5)
Total Protein: 6 g/dL — ABNORMAL LOW (ref 6.5–8.1)

## 2018-03-18 LAB — PROTIME-INR
INR: 1.56
Prothrombin Time: 18.5 seconds — ABNORMAL HIGH (ref 11.4–15.2)

## 2018-03-18 LAB — MRSA PCR SCREENING: MRSA BY PCR: NEGATIVE

## 2018-03-18 LAB — MAGNESIUM
Magnesium: 2 mg/dL (ref 1.7–2.4)
Magnesium: 1.8 mg/dL (ref 1.7–2.4)

## 2018-03-18 LAB — PHOSPHORUS
PHOSPHORUS: 2 mg/dL — AB (ref 2.5–4.6)
Phosphorus: 4.4 mg/dL (ref 2.5–4.6)

## 2018-03-18 LAB — PROCALCITONIN: Procalcitonin: <0.1 ng/mL

## 2018-03-18 MED ORDER — TRIAMTERENE-HCTZ 37.5-25 MG PO TABS
1.0000 | ORAL_TABLET | Freq: Every day | ORAL | Status: DC
  Administered 2018-03-18 – 2018-04-09 (×23): 1 via ORAL
  Filled 2018-03-18 (×23): qty 1

## 2018-03-18 MED ORDER — TRIAMTERENE-HCTZ 37.5-25 MG PO CAPS
1.0000 | ORAL_CAPSULE | Freq: Every day | ORAL | Status: DC
  Filled 2018-03-18: qty 1

## 2018-03-18 MED ORDER — FAMOTIDINE IN NACL 20-0.9 MG/50ML-% IV SOLN
20.0000 mg | Freq: Two times a day (BID) | INTRAVENOUS | Status: DC
  Administered 2018-03-18: 20 mg via INTRAVENOUS
  Filled 2018-03-18: qty 50

## 2018-03-18 MED ORDER — SODIUM CHLORIDE 0.9 % IV SOLN: 250.0000 mL | INTRAVENOUS | Status: DC | PRN

## 2018-03-18 MED ORDER — POTASSIUM CHLORIDE 10 MEQ/100ML IV SOLN
10.0000 meq | INTRAVENOUS | Status: AC
  Administered 2018-03-18 (×2): 10 meq via INTRAVENOUS
  Filled 2018-03-18 (×4): qty 100

## 2018-03-18 MED ORDER — ONDANSETRON HCL 4 MG/2ML IJ SOLN
4.0000 mg | Freq: Four times a day (QID) | INTRAMUSCULAR | Status: DC | PRN
  Administered 2018-03-18 – 2018-03-19 (×3): 4 mg via INTRAVENOUS
  Filled 2018-03-18 (×3): qty 2

## 2018-03-18 MED ORDER — POTASSIUM CHLORIDE 10 MEQ/100ML IV SOLN
10.0000 meq | INTRAVENOUS | Status: AC
  Administered 2018-03-18 (×2): 10 meq via INTRAVENOUS

## 2018-03-18 MED ORDER — SODIUM CHLORIDE 0.9 % IV SOLN
INTRAVENOUS | Status: DC
  Administered 2018-03-18: 09:00:00 via INTRAVENOUS
  Administered 2018-03-18: 125 mL/h via INTRAVENOUS
  Administered 2018-03-18 – 2018-03-20 (×6): via INTRAVENOUS

## 2018-03-18 MED ORDER — HEPARIN SODIUM (PORCINE) 5000 UNIT/ML IJ SOLN
5000.0000 [IU] | Freq: Three times a day (TID) | INTRAMUSCULAR | Status: DC
  Administered 2018-03-18: 5000 [IU] via SUBCUTANEOUS
  Filled 2018-03-18: qty 1

## 2018-03-18 MED ORDER — HYDRALAZINE HCL 20 MG/ML IJ SOLN: 10.0000 mg | INTRAMUSCULAR | Status: DC | PRN

## 2018-03-18 MED ORDER — OXCARBAZEPINE 300 MG PO TABS
300.0000 mg | ORAL_TABLET | Freq: Three times a day (TID) | ORAL | Status: DC
  Administered 2018-03-18 – 2018-04-09 (×66): 300 mg via ORAL
  Filled 2018-03-18 (×67): qty 1

## 2018-03-18 MED ORDER — INSULIN ASPART 100 UNIT/ML ~~LOC~~ SOLN
1.0000 [IU] | SUBCUTANEOUS | Status: DC
  Administered 2018-03-19 (×2): 1 [IU] via SUBCUTANEOUS
  Administered 2018-03-20: 2 [IU] via SUBCUTANEOUS
  Administered 2018-03-20 – 2018-03-27 (×14): 1 [IU] via SUBCUTANEOUS

## 2018-03-18 MED ORDER — DEXTROSE 5 % IV SOLN
15.0000 mg/kg/h | INTRAVENOUS | Status: DC
  Administered 2018-03-18 – 2018-03-20 (×3): 15 mg/kg/h via INTRAVENOUS
  Filled 2018-03-18 (×4): qty 200

## 2018-03-18 MED ORDER — SODIUM CHLORIDE 0.9% FLUSH
10.0000 mL | INTRAVENOUS | Status: DC | PRN
  Administered 2018-03-23 – 2018-04-09 (×7): 10 mL
  Filled 2018-03-18 (×7): qty 40

## 2018-03-18 MED ORDER — ACETYLCYSTEINE LOAD VIA INFUSION
150.0000 mg/kg | Freq: Once | INTRAVENOUS | Status: AC
  Administered 2018-03-18: 11775 mg via INTRAVENOUS
  Filled 2018-03-18: qty 295

## 2018-03-18 MED ORDER — LEVETIRACETAM IN NACL 500 MG/100ML IV SOLN
500.0000 mg | Freq: Two times a day (BID) | INTRAVENOUS | Status: DC
  Administered 2018-03-18 – 2018-03-22 (×9): 500 mg via INTRAVENOUS
  Filled 2018-03-18 (×11): qty 100

## 2018-03-18 MED ORDER — RIVAROXABAN 10 MG PO TABS
10.0000 mg | ORAL_TABLET | Freq: Every evening | ORAL | Status: DC
  Administered 2018-03-18 – 2018-04-08 (×22): 10 mg via ORAL
  Filled 2018-03-18 (×24): qty 1

## 2018-03-18 MED ORDER — SERTRALINE HCL 50 MG PO TABS
50.0000 mg | ORAL_TABLET | Freq: Every day | ORAL | Status: DC
  Administered 2018-03-18 – 2018-04-09 (×23): 50 mg via ORAL
  Filled 2018-03-18 (×24): qty 1

## 2018-03-18 NOTE — Progress Notes (Signed)
Unable to obtain CBG at this time due to "bad sample" via glucometer.  Glucose to be verified via am CMP, MD made aware

## 2018-03-18 NOTE — Progress Notes (Signed)
Pt received from ED as a drug overdose, not on suicide precautions per CCM admit note.  At this time pt denies suicidal ideation, will continue to monitor.

## 2018-03-18 NOTE — Progress Notes (Signed)
eLink Physician-Brief Progress Note Patient Name: [name] DOB: [birth year] MRN: [id number]   Date of Service  03/18/2018  HPI/Events of Note  Nausea and vomiting s/p narcotic OD  eICU Interventions  Check QTc Zofran 4 mg iv Q 6 Hrs prn nausea/vomiting        Okoronkwo U Ogan 03/18/2018, 3:24 AM

## 2018-03-18 NOTE — Progress Notes (Addendum)
PULMONARY / CRITICAL CARE MEDICINE   Name: [name] MRN: [id number] DOB: [birth year]    ADMISSION DATE:  03/17/2018 CONSULTATION DATE:  5/8  REFERRING MD:  Dr. Jonn Shingles EDP  CHIEF COMPLAINT:  AMS  HISTORY OF PRESENT ILLNESS:   57 year old male with PMH as below, which is significant for MS, Chronic pain, Depression, DVT, and gait abnormality. His family noted him to be minimally responsive on the evening hours of 5/8 and called EMS. Upon their arrival the patient had agonal respirations, but was pulsatile. Responded to intranasal narcan. He remained somnolent upon arrival to ED and was given IV narcan. He responded very well and was started on infusion. Patient reports taking 15 tablets of Percocet 10-325. He claims overdose was accidental and he had no intention of harming himself. Currently denies SI. He claims he also took tylenol and ASA. Smoker. Denies drinking and illegal drug use. PCCM asked to admit for Narcan infusion.   SUBJECTIVE:  Admitted overnight. No acute change.  Remains on narcan gtt.  Admits to taking "a lot of extra pills" because he "had some problems with my mind".  Vague regarding whether he was actually meaning to harm himself but does fully admit to taking too much intentionally.    VITAL SIGNS: BP (!) 148/85   Pulse 87   Temp 98.6 F (37 C) (Oral)   Resp 12   Ht 5\' 9"  (1.753 m)   Wt 76.2 kg (167 lb 15.9 oz)   SpO2 94%   BMI 24.81 kg/m   HEMODYNAMICS:    VENTILATOR SETTINGS:    INTAKE / OUTPUT: I/O last 3 completed shifts: In: 872.2 [I.V.:772.2; IV Piggyback:100] Out: 100 [Urine:100]  PHYSICAL EXAMINATION: General:  Chronically ill appearing male, NAD  Neuro:  Awake, alert, appropriate, MAE, somewhat slow to respond but oriented.  HEENT:  Weinert/AT, PERRL, no JVD Cardiovascular:  RRR, no MRG Lungs:  resps even non labored on RA, clear  Abdomen:  Soft, non-tender, non-distended Musculoskeletal:  No acute deformity or ROM limitation Skin:  Grossly  intact.   LABS:  BMET Recent Labs  Lab 03/17/18 1952 03/18/18 0731  NA 144 145  K 3.3* 3.3*  CL 115* 114*  CO2 21* 22  BUN 6 6  CREATININE 1.00 0.81  GLUCOSE 220* 116*    Electrolytes Recent Labs  Lab 03/17/18 1952 03/18/18 0731  CALCIUM 8.9 8.8*  MG 2.0 1.8  PHOS 4.4 2.0*    CBC Recent Labs  Lab 03/17/18 1952 03/18/18 0655  WBC 15.3* 12.9*  HGB 12.9* 12.9*  HCT 40.9 41.3  PLT 260 222    Coag's Recent Labs  Lab 03/18/18 0625  INR 1.56    Sepsis Markers Recent Labs  Lab 03/17/18 1952  PROCALCITON <0.10    ABG No results for input(s): PHART, PCO2ART, PO2ART in the last 168 hours.  Liver Enzymes Recent Labs  Lab 03/17/18 1952 03/18/18 0731  AST 19 43*  ALT 15* 50  ALKPHOS 98 80  BILITOT 0.3 0.8  ALBUMIN 3.6 3.1*    Cardiac Enzymes No results for input(s): TROPONINI, PROBNP in the last 168 hours.  Glucose Recent Labs  Lab 03/18/18 0757 03/18/18 1119  GLUCAP 110* 115*    Imaging No results found.   STUDIES:  CT head 5/9 > no acute abnormality.   CULTURES:   ANTIBIOTICS:   SIGNIFICANT EVENTS: 5/9 admitted for overdose  LINES/TUBES:   DISCUSSION: 57 year old male with chronic pain and MS admitted 5/9 after overdose  of percocet requiring narcan infusion and NAC. Taken to ICU for close monitoring and frequent labs.  ASSESSMENT / PLAN:  Acute toxic encephalopathy secondary to opioid/tylenol overdose - Continue narcan infusion - decreased by half.  If remains awake and alert can turn off this afternoon - Hold all sedating drugs - Trend LFT  (wnl on admit), Tylenol levels (138 on admit). ASA negative - continue NAC IV per pharmacy - NPO - Pepcid - will ask psych to see  - suicide sitter for now   MS Chronic pain - Holding home trazodone, gabapentin, baclofen, dalfampridine, Topamax, oxcarbazepine until taking PO - Keppra to IV  Hypokalemia - Replace K  Hyperglycemia without history or DM - CBG monitoring and  SSI  Hypertension history - Holding home HCTZ, verapamil, while NPO  Hx DVT on chronic anticoagulation - Start heparin infusion this afternoon if unable to restart PO Xarelto  Dispo ICU Code FULL SUP Pepcid VTE sq heparin Diet NPO - ok to advance diet once narcan gtt off    Dirk Dress, NP 03/18/2018  12:28 PM Pager: [phone] or [phone]  Attending Note:  57 year old male with MS history on chronic steroids who presents to the hospital with AMS and was found to have unintentionally overdose of percocet 10-325.  On exam, he is more awake and interactive, moving all ext to command.  I reviewed CXR myself, no acute disease noted.  Discussed with PCCM-NP.  Will work on d/cing narcan today if able.  Will continue mucomyst drip for now.  Replace electrolytes as indicated.  BMET in AM.  Psych consult called.  Tylenol level and LFTs in AM.  The patient is critically ill with multiple organ systems failure and requires high complexity decision making for assessment and support, frequent evaluation and titration of therapies, application of advanced monitoring technologies and extensive interpretation of multiple databases.   Critical Care Time devoted to patient care services described in this note is  31  Minutes. This time reflects time of care of this signee Dr Koren Bound. This critical care time does not reflect procedure time, or teaching time or supervisory time of PA/NP/Med student/Med Resident etc but could involve care discussion time.  Alyson Reedy, M.D. [facility] Pulmonary/Critical Care Medicine. Pager: [phone]. After hours pager: [phone].

## 2018-03-18 NOTE — Care Management Note (Addendum)
Case Management Note  Patient Details  Name: [name] MRN: [id number] Date of Birth: [birth year]  Subjective/Objective:  History of MS, Chronic pain, Depression, DVT, smoker, Chronic back pain, Chronic pain syndrome, and gait abnormality.  Admitted for Acute toxic encephalopathy secondary to opioid/tylenol overdose.              Action/Plan: Psych consult called. Prior to admission patient lived at home.  NCM will continue to follow for discharge transition needs  Expected Discharge Date:   To be determined               Expected Discharge Plan:   To be determined  In-House Referral:   Clinical Social Worker  Discharge planning Services   CM consult  Status of Service:   In progress, will continue to monitor.  Yancey Flemings, RN 03/18/2018, 2:39 PM

## 2018-03-18 NOTE — Progress Notes (Signed)
Lab specimens obtained by IV team upon IV insertion reported to have coagulated per the Laboratory.  Oncoming nurse Baldomero Lamy made aware.

## 2018-03-18 NOTE — ED Notes (Signed)
Pharmacist notified to verify pt.'s admission orders.

## 2018-03-18 NOTE — Progress Notes (Signed)
eLink Physician-Brief Progress Note Patient Name: [name] DOB: October 16, 1961 MRN: [id number]   Date of Service  03/18/2018  HPI/Events of Note  Hypokalemia  eICU Interventions  Kcl 10 meq iv x 2 via peripheral line        Okoronkwo U Ogan 03/18/2018, 6:22 AM

## 2018-03-18 NOTE — Progress Notes (Signed)
MEDICATION RELATED CONSULT NOTE - FOLLOW UP  Pharmacy Consult for IV Acetylcysteine  Indication: Accidental APAP overdose  No Known Allergies  Patient Measurements: Height: 5\' 9"  (175.3 cm) Weight: 173 lb (78.5 kg) IBW/kg (Calculated) : 70.7  Vital Signs: Temp: 97.5 F (36.4 C) (05/08 1930) Temp Source: Oral (05/08 1930) BP: 103/63 (05/09 0015) Pulse Rate: 61 (05/09 0015)  Labs: Recent Labs    03/17/18 1952  WBC 15.3*  HGB 12.9*  HCT 40.9  PLT 260  CREATININE 1.00  ALBUMIN 3.6  PROT 7.0  AST 19  ALT 15*  ALKPHOS 98  BILITOT 0.3     Assessment: Acute toxic encephalopathy secondary to accidental opioid and APAP overdose. Starting IV Acetylcysteine. APAP level 136. Salicylate level negative. AST/ALT 19/15. No INR yet.   I have contacted the [facility] control center and they are in agreement with our current plan  Plan:  Acetylcysteine 150 mg/kg BOLUS Start acetylcysteine infusion at 15 mg/kg/hr  CMET/INR/APAP level at 23 hours   Abran Duke 03/18/2018,12:45 AM

## 2018-03-19 ENCOUNTER — Inpatient Hospital Stay (HOSPITAL_COMMUNITY): Payer: 59

## 2018-03-19 DIAGNOSIS — R4587 Impulsiveness: Secondary | ICD-10-CM

## 2018-03-19 DIAGNOSIS — I10 Essential (primary) hypertension: Secondary | ICD-10-CM

## 2018-03-19 DIAGNOSIS — Z87891 Personal history of nicotine dependence: Secondary | ICD-10-CM

## 2018-03-19 DIAGNOSIS — G4700 Insomnia, unspecified: Secondary | ICD-10-CM

## 2018-03-19 DIAGNOSIS — K219 Gastro-esophageal reflux disease without esophagitis: Secondary | ICD-10-CM

## 2018-03-19 DIAGNOSIS — F419 Anxiety disorder, unspecified: Secondary | ICD-10-CM

## 2018-03-19 DIAGNOSIS — F333 Major depressive disorder, recurrent, severe with psychotic symptoms: Secondary | ICD-10-CM

## 2018-03-19 DIAGNOSIS — G894 Chronic pain syndrome: Secondary | ICD-10-CM

## 2018-03-19 DIAGNOSIS — F1290 Cannabis use, unspecified, uncomplicated: Secondary | ICD-10-CM

## 2018-03-19 DIAGNOSIS — T402X2A Poisoning by other opioids, intentional self-harm, initial encounter: Secondary | ICD-10-CM

## 2018-03-19 DIAGNOSIS — T1491XA Suicide attempt, initial encounter: Secondary | ICD-10-CM

## 2018-03-19 LAB — COMPREHENSIVE METABOLIC PANEL
ALK PHOS: 83 U/L (ref 38–126)
ALT: 109 U/L — AB (ref 17–63)
AST: 86 U/L — AB (ref 15–41)
Albumin: 2.8 g/dL — ABNORMAL LOW (ref 3.5–5.0)
Anion gap: 11 (ref 5–15)
BILIRUBIN TOTAL: 0.7 mg/dL (ref 0.3–1.2)
CALCIUM: 8.7 mg/dL — AB (ref 8.9–10.3)
CO2: 18 mmol/L — ABNORMAL LOW (ref 22–32)
CREATININE: 0.78 mg/dL (ref 0.61–1.24)
Chloride: 111 mmol/L (ref 101–111)
GFR calc Af Amer: >60 mL/min (ref >60)
Glucose, Bld: 105 mg/dL — ABNORMAL HIGH (ref 65–99)
Potassium: 3 mmol/L — ABNORMAL LOW (ref 3.5–5.1)
Sodium: 140 mmol/L (ref 135–145)
TOTAL PROTEIN: 5.5 g/dL — AB (ref 6.5–8.1)

## 2018-03-19 LAB — ACETAMINOPHEN LEVEL: Acetaminophen (Tylenol), Serum: <10 ug/mL — ABNORMAL LOW (ref 10–30)

## 2018-03-19 LAB — GLUCOSE, CAPILLARY
GLUCOSE-CAPILLARY: 118 mg/dL — AB (ref 65–99)
GLUCOSE-CAPILLARY: 106 mg/dL — AB (ref 65–99)
GLUCOSE-CAPILLARY: 133 mg/dL — AB (ref 65–99)
GLUCOSE-CAPILLARY: 134 mg/dL — AB (ref 65–99)
Glucose-Capillary: 125 mg/dL — ABNORMAL HIGH (ref 65–99)
Glucose-Capillary: 106 mg/dL — ABNORMAL HIGH (ref 65–99)

## 2018-03-19 LAB — HEPATIC FUNCTION PANEL
ALBUMIN: 2.9 g/dL — AB (ref 3.5–5.0)
ALT: 162 U/L — ABNORMAL HIGH (ref 17–63)
ALT: 66 U/L — AB (ref 17–63)
AST: 112 U/L — ABNORMAL HIGH (ref 15–41)
AST: 55 U/L — AB (ref 15–41)
Albumin: 2.7 g/dL — ABNORMAL LOW (ref 3.5–5.0)
Alkaline Phosphatase: 87 U/L (ref 38–126)
Alkaline Phosphatase: 85 U/L (ref 38–126)
BILIRUBIN DIRECT: 0.1 mg/dL (ref 0.1–0.5)
BILIRUBIN INDIRECT: 0.7 mg/dL (ref 0.3–0.9)
BILIRUBIN INDIRECT: 0.6 mg/dL (ref 0.3–0.9)
BILIRUBIN TOTAL: 0.8 mg/dL (ref 0.3–1.2)
Bilirubin, Direct: 0.2 mg/dL (ref 0.1–0.5)
TOTAL PROTEIN: 5.6 g/dL — AB (ref 6.5–8.1)
Total Bilirubin: 0.8 mg/dL (ref 0.3–1.2)
Total Protein: 5.4 g/dL — ABNORMAL LOW (ref 6.5–8.1)

## 2018-03-19 LAB — PROTIME-INR
INR: 2.74
Prothrombin Time: 28.8 seconds — ABNORMAL HIGH (ref 11.4–15.2)

## 2018-03-19 MED ORDER — TRAZODONE HCL 50 MG PO TABS: 50.0000 mg | ORAL_TABLET | Freq: Every evening | ORAL | Status: DC | PRN

## 2018-03-19 MED ORDER — RISPERIDONE 0.5 MG PO TABS
0.5000 mg | ORAL_TABLET | Freq: Two times a day (BID) | ORAL | Status: DC
  Administered 2018-03-19 – 2018-04-09 (×42): 0.5 mg via ORAL
  Filled 2018-03-19 (×45): qty 1

## 2018-03-19 MED ORDER — POTASSIUM CHLORIDE CRYS ER 20 MEQ PO TBCR
40.0000 meq | EXTENDED_RELEASE_TABLET | Freq: Once | ORAL | Status: AC
  Administered 2018-03-19: 40 meq via ORAL
  Filled 2018-03-19: qty 2

## 2018-03-19 MED ORDER — HYDROXYZINE HCL 25 MG PO TABS
25.0000 mg | ORAL_TABLET | Freq: Three times a day (TID) | ORAL | Status: DC | PRN
  Administered 2018-04-01: 25 mg via ORAL
  Filled 2018-03-19 (×2): qty 1

## 2018-03-19 MED ORDER — PANTOPRAZOLE SODIUM 40 MG PO TBEC
40.0000 mg | DELAYED_RELEASE_TABLET | Freq: Every day | ORAL | Status: DC
  Administered 2018-03-19 – 2018-04-09 (×21): 40 mg via ORAL
  Filled 2018-03-19 (×22): qty 1

## 2018-03-19 MED ORDER — MORPHINE SULFATE (PF) 2 MG/ML IV SOLN
2.0000 mg | Freq: Once | INTRAVENOUS | Status: AC
  Administered 2018-03-19: 2 mg via INTRAVENOUS
  Filled 2018-03-19: qty 1

## 2018-03-19 MED ORDER — TRAZODONE HCL 50 MG PO TABS
50.0000 mg | ORAL_TABLET | Freq: Every evening | ORAL | Status: DC | PRN
  Administered 2018-03-19 – 2018-04-03 (×8): 50 mg via ORAL
  Filled 2018-03-19 (×9): qty 1

## 2018-03-19 MED ORDER — HYDROXYZINE HCL 25 MG PO TABS: 25.0000 mg | ORAL_TABLET | Freq: Three times a day (TID) | ORAL | Status: DC | PRN

## 2018-03-19 MED ORDER — SERTRALINE HCL 50 MG PO TABS: 50.0000 mg | ORAL_TABLET | Freq: Every day | ORAL | Status: DC

## 2018-03-19 MED ORDER — IBUPROFEN 600 MG PO TABS
600.0000 mg | ORAL_TABLET | Freq: Once | ORAL | Status: AC
  Administered 2018-03-19: 600 mg via ORAL
  Filled 2018-03-19: qty 1

## 2018-03-19 NOTE — Progress Notes (Signed)
MEDICATION RELATED CONSULT NOTE - FOLLOW UP  Pharmacy Consult for IV Acetylcysteine  Indication: Accidental APAP overdose  No Known Allergies  Patient Measurements: Height: 5\' 9"  (175.3 cm) Weight: 175 lb 4.3 oz (79.5 kg) IBW/kg (Calculated) : 70.7  Vital Signs: Temp: 99.9 F (37.7 C) (05/10 0544) Temp Source: Oral (05/10 0544) BP: 132/71 (05/10 0544) Pulse Rate: 102 (05/10 0544)  Labs: Recent Labs    03/17/18 1952 03/18/18 0655 03/18/18 0731 03/19/18 0045 03/19/18 0627  WBC 15.3* 12.9*  --   --   --   HGB 12.9* 12.9*  --   --   --   HCT 40.9 41.3  --   --   --   PLT 260 222  --   --   --   CREATININE 1.00  --  0.81  --  0.78  MG 2.0  --  1.8  --   --   PHOS 4.4  --  2.0*  --   --   ALBUMIN 3.6  --  3.1* 2.9* 2.8*  PROT 7.0  --  6.0* 5.6* 5.5*  AST 19  --  43* 55* 86*  ALT 15*  --  50 66* 109*  ALKPHOS 98  --  80 87 83  BILITOT 0.3  --  0.8 0.8 0.7  BILIDIR  --   --  <0.1* 0.2  --   IBILI  --   --  NOT CALCULATED 0.6  --      Assessment: Acute toxic encephalopathy secondary to accidental opioid and APAP overdose and started on IV N-Acetylcysteine.   Initial APAP level 136, now down to <10 Initial LFTs 19/15, slowly rising 43/50 >> 86/109 INR is falsely elevated in the setting of Xarelto use at 2.74  Discussed with poison control today and the plan is to continue N-acetylcysteine for another 24 hours given the bump up in LFTs. Will contact them in the morning with updated labs to discuss additional plans.   Plan:  Continue N-acetylcysteine infusion at 15 mg/kg/hr  Will obtain a hepatic function panel in the AM to monitor INR trends  Thank you for allowing pharmacy to be a part of this patient's care.  Georgina Pillion, PharmD, BCPS Clinical Pharmacist Pager: [phone] Clinical phone for 03/19/2018 from 7a-3:30p: [phone] If after 3:30p, please call main pharmacy at: [phone] 03/19/2018 11:29 AM

## 2018-03-19 NOTE — Progress Notes (Addendum)
[facility]                                                                              Patient Demographics  [name], is a 57 y.o. male, DOB - [birth year], WUJ:[id number]  Admit date - 03/17/2018   Admitting Physician Carin Hock, MD  Outpatient Primary MD for the patient is Fleet Contras, MD  Outpatient specialists:   LOS - 1  days   Medical records reviewed and are as summarized below:    Chief Complaint  Patient presents with  . Drug Overdose       Brief summary   Admitted by CCM to ICU on 39/37 57 year old male with PMH as below, which is significant for MS, Chronic pain, Depression, DVT, and gait abnormality. His family noted him to be minimally responsive on the evening hours of 5/8 and called EMS. Upon their arrival the patient had agonal respirations, but was pulsatile. Responded to intranasal narcan. He remained somnolent upon arrival to ED and was given IV narcan. He responded very well and was started on infusion. Patient reports taking 15 tablets of Percocet 10-325. He claims overdose was accidental and he had no intention of harming himself. Currently denies SI. He claims he also took tylenol and ASA. Smoker. Denies drinking and illegal drug use. PCCM asked to admit for Narcan infusion  Assessment & Plan    Principal Problem:   Overdose by acetaminophen, accidental or unintentional, initial encounter -Patient alert and oriented, after Narcan infusion continue to hold all sedating drugs.  Tylenol level 138 on admission, currently <10.  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 -Continue Xarelto     Multiple sclerosis (HCC) -Currently no flare, continue to hold trazodone, gabapentin, baclofen, Topamax, dalfampridine -Continue Keppra, Trileptal  Hypokalemia Replaced    GERD (gastroesophageal  reflux disease) -Continue Protonix    Essential hypertension - Currently stable, continue triamterene HCTZ    Right shoulder pain, chronic pain syndrome -Patient reports right shoulder has been hurting for last 2 weeks, fell on it  -will obtain x-ray of the right shoulder   Code Status: Full DVT Prophylaxis: Xarelto Family Communication: Discussed in detail with the patient, all imaging results, lab results explained to the patient    Disposition Plan: Once medically stable, will need inpatient behavioral health  Time Spent in minutes   35 minutes  Procedures:  None  Consultants:   Patient was admitted by CCM Psychiatry  Antimicrobials:      Medications  Scheduled Meds: . insulin aspart  1-3 Units Subcutaneous Q4H  . Oxcarbazepine  300 mg Oral TID  . rivaroxaban  10 mg Oral QPM  . sertraline  50 mg Oral Daily  . triamterene-hydrochlorothiazide  1 tablet Oral Daily   Continuous Infusions: . sodium chloride    . sodium chloride 125 mL/hr at 03/19/18 0051  . acetylcysteine 15 mg/kg/hr (03/18/18 2249)  . levETIRAcetam Stopped (03/19/18 0510)  . naLOXone [facility]) adult infusion for OVERDOSE Stopped (03/18/18 1356)   PRN  Meds:.sodium chloride, hydrALAZINE, ondansetron (ZOFRAN) IV, sodium chloride flush   Antibiotics   Anti-infectives (From admission, onward)   None        Subjective:   Bentley Yokel was seen and examined today.  Complaining of right shoulder pain, hurting for last 2 weeks.  Low-grade temp of 100 F.  No coughing or chills. Patient denies dizziness, chest pain, shortness of breath, abdominal pain, N/V/D/C, new weakness, numbess, tingling. No acute events overnight.    Objective:   Vitals:   03/18/18 2000 03/18/18 2030 03/18/18 2100 03/19/18 0544  BP: (!) 162/68  (!) 147/57 132/71  Pulse: 97 99 (!) 109 (!) 102  Resp: 16 18 18 16   Temp:   100 F (37.8 C) 99.9 F (37.7 C)  TempSrc:   Oral Oral  SpO2: 92% 92% 94%   Weight:    79.5 kg  (175 lb 4.3 oz)  Height:        Intake/Output Summary (Last 24 hours) at 03/19/2018 1103 Last data filed at 03/19/2018 0947 Gross per 24 hour  Intake 2168.87 ml  Output 2425 ml  Net -256.13 ml     Wt Readings from Last 3 Encounters:  03/19/18 79.5 kg (175 lb 4.3 oz)  03/01/18 78.5 kg (173 lb)  12/19/17 83.5 kg (184 lb)     Exam  General: Alert and oriented x 3, NAD  Eyes:  HEENT:  Atraumatic, normocephalic  Cardiovascular: S1 S2 auscultated,  Regular rate and rhythm.  Respiratory: Clear to auscultation bilaterally, no wheezing, rales or rhonchi  Gastrointestinal: Soft, nontender, nondistended, + bowel sounds  Ext: no pedal edema bilaterally  Neuro: no new deficit  Musculoskeletal: No cyanosis or clubbing.  Right shoulder pain, ROM dec   Skin: No rashes  Psych: Normal affect and demeanor, alert and oriented x3    Data Reviewed:  I have personally reviewed following labs and imaging studies  Micro Results Recent Results (from the past 240 hour(s))  MRSA PCR Screening     Status: None   Collection Time: 03/18/18  2:23 AM  Result Value Ref Range Status   MRSA by PCR NEGATIVE NEGATIVE Final    Comment:        The GeneXpert MRSA Assay (FDA approved for NASAL specimens only), is one component of a comprehensive MRSA colonization surveillance program. It is not intended to diagnose MRSA infection nor to guide or monitor treatment for MRSA infections. Performed at [facility], [address] [place], [place] [address]     Radiology Reports Dg Chest 2 View  Result Date: 03/04/2018 CLINICAL DATA:  Chest pain EXAM: CHEST - 2 VIEW COMPARISON:  12/19/2017 FINDINGS: Right Port-A-Cath remains in place, unchanged. Heart and mediastinal contours are within normal limits. No focal opacities or effusions. No acute bony abnormality. IMPRESSION: No active cardiopulmonary disease. Electronically Signed   By: Charlett Nose M.D.   On: 03/04/2018 10:17   Ct Head Wo  Contrast  Result Date: 03/04/2018 CLINICAL DATA:  Chest pain and weakness for a week. Headache and right arm pain. EXAM: CT HEAD WITHOUT CONTRAST TECHNIQUE: Contiguous axial images were obtained from the base of the skull through the vertex without intravenous contrast. COMPARISON:  12/21/2017 FINDINGS: BRAIN: The ventricles and sulci are normal. No intraparenchymal hemorrhage, mass effect nor midline shift. No acute large vascular territory infarcts. Grey-white matter distinction is maintained. The basal ganglia are unremarkable. No abnormal extra-axial fluid collections. Basal cisterns are not effaced and midline. The brainstem and cerebellar hemispheres are  without acute abnormalities. VASCULAR: No hyperdense vessel or unexpected calcification. SKULL/SOFT TISSUES: No acute calvarial fracture. No significant soft tissue swelling. ORBITS/SINUSES: The included ocular globes and orbital contents are normal.The mastoid air cells are clear. The included paranasal sinuses are well-aerated. OTHER: None. IMPRESSION: No acute abnormality noted.  Stable appearance of the brain. Electronically Signed   By: Tollie Eth M.D.   On: 03/04/2018 19:14    Lab Data:  CBC: Recent Labs  Lab 03/17/18 1952 03/18/18 0655  WBC 15.3* 12.9*  NEUTROABS 13.2*  --   HGB 12.9* 12.9*  HCT 40.9 41.3  MCV 89.7 89.4  PLT 260 222   Basic Metabolic Panel: Recent Labs  Lab 03/17/18 1952 03/18/18 0731 03/19/18 0627  NA 144 145 140  K 3.3* 3.3* 3.0*  CL 115* 114* 111  CO2 21* 22 18*  GLUCOSE 220* 116* 105*  BUN 6 6 <5*  CREATININE 1.00 0.81 0.78  CALCIUM 8.9 8.8* 8.7*  MG 2.0 1.8  --   PHOS 4.4 2.0*  --    GFR: Estimated Creatinine Clearance: 101.9 mL/min (by C-G formula based on SCr of 0.78 mg/dL). Liver Function Tests: Recent Labs  Lab 03/17/18 1952 03/18/18 0731 03/19/18 0045 03/19/18 0627  AST 19 43* 55* 86*  ALT 15* 50 66* 109*  ALKPHOS 98 80 87 83  BILITOT 0.3 0.8 0.8 0.7  PROT 7.0 6.0* 5.6* 5.5*    ALBUMIN 3.6 3.1* 2.9* 2.8*   No results for input(s): LIPASE, AMYLASE in the last 168 hours. No results for input(s): AMMONIA in the last 168 hours. Coagulation Profile: Recent Labs  Lab 03/18/18 0625 03/19/18 0426  INR 1.56 2.74   Cardiac Enzymes: No results for input(s): CKTOTAL, CKMB, CKMBINDEX, TROPONINI in the last 168 hours. BNP (last 3 results) No results for input(s): PROBNP in the last 8760 hours. HbA1C: No results for input(s): HGBA1C in the last 72 hours. CBG: Recent Labs  Lab 03/18/18 1656 03/18/18 1939 03/19/18 0106 03/19/18 0349 03/19/18 0756  GLUCAP 86 100* 125* 106* 118*   Lipid Profile: No results for input(s): CHOL, HDL, LDLCALC, TRIG, CHOLHDL, LDLDIRECT in the last 72 hours. Thyroid Function Tests: No results for input(s): TSH, T4TOTAL, FREET4, T3FREE, THYROIDAB in the last 72 hours. Anemia Panel: No results for input(s): VITAMINB12, FOLATE, FERRITIN, TIBC, IRON, RETICCTPCT in the last 72 hours. Urine analysis:    Component Value Date/Time   COLORURINE YELLOW 03/04/2018 1943   APPEARANCEUR CLEAR 03/04/2018 1943   LABSPEC 1.009 03/04/2018 1943   PHURINE 6.0 03/04/2018 1943   GLUCOSEU NEGATIVE 03/04/2018 1943   HGBUR NEGATIVE 03/04/2018 1943   BILIRUBINUR NEGATIVE 03/04/2018 1943   KETONESUR NEGATIVE 03/04/2018 1943   PROTEINUR NEGATIVE 03/04/2018 1943   UROBILINOGEN 1.0 05/14/2014 2113   NITRITE NEGATIVE 03/04/2018 1943   LEUKOCYTESUR NEGATIVE 03/04/2018 1943     Exavier Lina M.D. [facility] 03/19/2018, 11:03 AM  Pager: [phone] Between 7am to 7pm - call Pager - [phone]  After 7pm go to [email or web address] - password [email or web address]  Call night coverage person covering after 7pm

## 2018-03-19 NOTE — Consult Note (Addendum)
[facility] Face-to-Face Psychiatry Consult   Reason for Consult:  SI Referring Physician:  Dr. Tana Coast Patient Identification: [name] MRN:  [id number] Principal Diagnosis: Suicide attempt [facility]) Diagnosis:   Patient Active Problem List   Diagnosis Date Noted  . Overdose by acetaminophen, accidental or unintentional, initial encounter [T39.1X1A] 03/18/2018  . Intentional acetaminophen overdose ([place]) [T39.1X2A]   . Severe major depression, single episode, with psychotic features ([place]) [F32.3] 10/04/2017  . Weakness [R53.1]   . Cellulitis of right leg [L03.115] 05/07/2017  . Right carpal tunnel syndrome [G56.01] 02/16/2017  . Gait disturbance [R26.9] 01/22/2017  . Insomnia [G47.00] 01/22/2017  . Depression with anxiety [F41.8] 01/22/2017  . High risk medication use [Z79.899] 01/22/2017  . Major depressive disorder, recurrent episode ([place]) [F33.9] 12/19/2016  . Suicidal ideation [R45.851]   . AKI (acute kidney injury) ([place]) [N17.9] 11/15/2016  . Chronic hepatitis C virus infection ([place]) [B18.2] 11/15/2016  . Chronic abdominal pain [R10.9, G89.29] 11/15/2016  . Chronic pain syndrome [G89.4] 11/15/2016  . Acute retention of urine [R33.8] 11/15/2016  . Generalized abdominal pain [R10.84]   . Chronic cluster headache, not intractable [G44.029]   . Community acquired pneumonia [J18.9]   . 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 HCAP (healthcare-associated pneumonia) [J18.9] 02/16/2016  . Hypokalemia [E87.6] 02/16/2016  . Intractable cluster headache syndrome [G44.001]   . DVT (deep venous thrombosis) ([place]) [I82.409]   . Depression [F32.9]   . GERD (gastroesophageal reflux disease) [K21.9]   . HTN (hypertension) [I10]   . Essential hypertension [I10]   . Gastroesophageal reflux disease without esophagitis [K21.9]   . CAP (community acquired pneumonia) [J18.9]   . Multiple sclerosis ([place]) [G35] 08/02/2013  . ABDOMINAL BLOATING [R14.3, R14.1, R14.2] 10/14/2010  . LOOSE STOOLS [R19.7] 10/14/2010  .  PULMONARY EMBOLISM, HX OF [Z86.718] 10/14/2010    Total Time spent with patient: 1 hour  Subjective:   [name] is a 57 y.o. male patient admitted with Percocet overdose.  HPI:   Per chart review, patient was admitted with Percocet overdose. He took Percocet 10-325 mg tablets (#15). He reports that his overdose was accidental and he has no intention of harming himself. He reported taking "a lot of extra pills because I had some problems with my mind" to the admitting provider. He required Narcan en route due to agonal respirations. Tylenol level was 136 on admission. He is receiving IV NAC.   Of note, patient was last seen on 2/12 by telepsych. He initially endorsed SI in the setting of MS complications. He was observed overnight and reported an improvement in his symptoms. He was admitted to [facility] in 09/2017 for MDD, single episode with psychosis. He was discharged on Abilify 10 mg daily, Gabapentin 400 mg TID, Atarax 25 mg QID PRN, Nortriptyline 50 mg qhs, Trileptal 300 mg TID, Zoloft 50 mg daily and Topamax 100 mg qhs. Trileptal and Zoloft have been restarted in the hospital. PMP indicates regularly filled prescriptions for Percocet. His last prescription was filled on 4/12.  On interview, [name]  is unable to provide a logical reason how he accidentally overdosed on multiple Percocet pills.  He initially reports that he put the pills into a glass of water and they dissolved so he was unaware of how much he ingested.  He later reports that he set a bottle of pills on the floor.  He reports falling on his right shoulder although it is unclear if this was prior to ingesting the pills or after because he is unable  to explain in either case how he ingested the pills.  He believes that he took 30-40 Percocet pills.  He does report that he had an argument with his brother that day and he reports that he was "tired of everything."  He denies SI or attempting to end his life.  He reports that his  mood has been good.  He reports compliance with his psychiatric medications but is unable to list his medications or state who prescribes his medications.  He later reports that he is not taking his medications but would like to have them restarted.  He reports poor appetite with a 40 pound weight loss in the past 3 months.  He denies problems with sleep.  He denies HI.  He reports AVH in the setting of an argument with his brother but it is unclear if he has had ongoing hallucinations prior to this argument.  His mother was at bedside with his permission.  She reports that he has been very depressed.  She believes that he intentionally overdosed and has concerns for his safety.  Past Psychiatric History: MDD with psychosis and anxiety.   Risk to Self:  Yes given recent overdose.  Risk to Others:  None. Denies HI.  Prior Inpatient Therapy:  He was admitted to [facility] in 09/2017 for MDD with psychosis.  Prior Outpatient Therapy:  He is followed by [facility].   Past Medical History:  Past Medical History:  Diagnosis Date  . Abdominal pain, unspecified site   . Anxiety   . Arthritis   . Benign paroxysmal positional vertigo   . Cellulitis and abscess of right leg 04/2017  . Chronic back pain   . Chronic pain syndrome 01/25/2008  . Cluster headache   . Depression    takes Zoloft daily  . DVT (deep venous thrombosis) (HCC)    in the left arm '09  . Gait abnormality    "uses mobile wheelchair, but is ambulatory"  . Gallstones 02/17/2009   resolved after gallbladder surgery.  [name] GERD (gastroesophageal reflux disease)    takes Omeprazole as needed  . Headache(784.0)    cluster headaches frequently-takes Topamax daily  . History of colonoscopy   . HTN (hypertension)    takes Lisinopril,Verapamil,and Triamterene HCTZ daily  . Insomnia 11/06/2008  . Joint pain   . Joint swelling    03-07-16 "swelling of right wrist" "after a fall-xray done 03-06-16 "no fractures".  . Memory loss    no an issue at  present 03-07-16  . Multiple sclerosis ([place])    Dx. 2005 - Dr. Dellis Filbert follows [place] 4'17 tx. [facility]abri [facility] [place].  [name] Nonspecific elevation of levels of transaminase or lactic acid dehydrogenase (LDH)   . Other specified visual disturbances   . Other syndromes affecting cervical region   . Pneumonia 2009  . Trigeminal neuralgia     history" Multiple sclerosis"    Past Surgical History:  Procedure Laterality Date  . CHOLECYSTECTOMY  02/20/2009  . COLONOSCOPY WITH PROPOFOL N/A 03/17/2016   Procedure: COLONOSCOPY WITH PROPOFOL;  Surgeon: Laurence Spates, MD;  Location: [facility];  Service: Endoscopy;  Laterality: N/A;  . PORT A CATH REVISION N/A 07/06/2015   Procedure: Removal and replacement of PORT A CATH;  Surgeon: Fanny Skates, MD;  Location: [place];  Service: General;  Laterality: N/A;  . PORTACATH PLACEMENT N/A 03/27/2014   Procedure: INSERTION PORT-A-CATH;  Surgeon: Adin Hector, MD;  Location: [place];  Service: General;  Laterality: N/A;  Family History:  Family History  Problem Relation Age of Onset  . Cancer Father   . Diabetes Mother    Family Psychiatric  History: Denies  Social History:  Social History   Substance and Sexual Activity  Alcohol Use Yes  . Alcohol/week: 0.0 oz   Comment: occasional     Social History   Substance and Sexual Activity  Drug Use No   Comment: Quit 2011    Social History   Socioeconomic History  . Marital status: Divorced    Spouse name: Not on file  . Number of children: 3  . Years of education: 49 th  . Highest education level: Not on file  Occupational History    Comment: Disabled  Social Needs  . Financial resource strain: Not on file  . Food insecurity:    Worry: Not on file    Inability: Not on file  . Transportation needs:    Medical: Not on file    Non-medical: Not on file  Tobacco Use  . Smoking status: Former Smoker    Packs/day: 1.00    Years: 38.00    Pack years: 38.00     Types: Cigarettes  . Smokeless tobacco: Never Used  . Tobacco comment: cutting back  Substance and Sexual Activity  . Alcohol use: Yes    Alcohol/week: 0.0 oz    Comment: occasional  . Drug use: No    Comment: Quit 2011  . Sexual activity: Not on file  Lifestyle  . Physical activity:    Days per week: Not on file    Minutes per session: Not on file  . Stress: Not on file  Relationships  . Social connections:    Talks on phone: Not on file    Gets together: Not on file    Attends religious service: Not on file    Active member of club or organization: Not on file    Attends meetings of clubs or organizations: Not on file    Relationship status: Not on file  Other Topics Concern  . Not on file  Social History Narrative   Patient is divorced. Patient is disabled. Because of his MS. Patient has 11 th grade education. 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    Right handed.   Caffeine- One daily.   Additional Social History: He lives at home with his mother and older brother. He denies alcohol use. He reports daily marijuana use. UDS was positive for THC, cocaine and opiates in February.     Allergies:  No Known Allergies  Labs:  Results for orders placed or performed during the hospital encounter of 03/17/18 (from the past 48 hour(s))  Acetaminophen level     Status: Abnormal   Collection Time: 03/17/18  7:52 PM  Result Value Ref Range   Acetaminophen (Tylenol), Serum 136 (H) 10 - 30 ug/mL    Comment:        THERAPEUTIC CONCENTRATIONS VARY SIGNIFICANTLY. A RANGE OF 10-30 ug/mL MAY BE AN EFFECTIVE CONCENTRATION FOR MANY PATIENTS. HOWEVER, SOME ARE BEST TREATED AT CONCENTRATIONS OUTSIDE THIS RANGE. ACETAMINOPHEN CONCENTRATIONS >150 ug/mL AT 4 HOURS AFTER INGESTION AND >50 ug/mL AT 12 HOURS AFTER INGESTION ARE OFTEN ASSOCIATED WITH TOXIC REACTIONS. Performed at [facility], [place] [address] [place], [place] [address]    Comprehensive metabolic panel     Status: Abnormal   Collection Time: 03/17/18  7:52 PM  Result Value Ref Range   Sodium  144 135 - 145 mmol/L   Potassium 3.3 (L) 3.5 - 5.1 mmol/L   Chloride 115 (H) 101 - 111 mmol/L   CO2 21 (L) 22 - 32 mmol/L   Glucose, Bld 220 (H) 65 - 99 mg/dL   BUN 6 6 - 20 mg/dL   Creatinine, Ser 1.00 0.61 - 1.24 mg/dL   Calcium 8.9 8.9 - 10.3 mg/dL   Total Protein 7.0 6.5 - 8.1 g/dL   Albumin 3.6 3.5 - 5.0 g/dL   AST 19 15 - 41 U/L   ALT 15 (L) 17 - 63 U/L   Alkaline Phosphatase 98 38 - 126 U/L   Total Bilirubin 0.3 0.3 - 1.2 mg/dL   GFR calc non Af Amer >60 >60 mL/min   GFR calc Af Amer >60 >60 mL/min    Comment: (NOTE) The eGFR has been calculated using the CKD EPI equation. This calculation has not been validated in all clinical situations. eGFR's persistently <60 mL/min signify possible Chronic Kidney Disease.    Anion gap 8 5 - 15    Comment: Performed at [facility] [address], [place], [place] [address]  CBC with Differential     Status: Abnormal   Collection Time: 03/17/18  7:52 PM  Result Value Ref Range   WBC 15.3 (H) 4.0 - 10.5 K/uL   RBC 4.56 4.22 - 5.81 MIL/uL   Hemoglobin 12.9 (L) 13.0 - 17.0 g/dL   HCT 40.9 39.0 - 52.0 %   MCV 89.7 78.0 - 100.0 fL   MCH 28.3 26.0 - 34.0 pg   MCHC 31.5 30.0 - 36.0 g/dL   RDW 16.0 (H) 11.5 - 15.5 %   Platelets 260 150 - 400 K/uL   Neutrophils Relative % 86 %   Neutro Abs 13.2 (H) 1.7 - 7.7 K/uL   Lymphocytes Relative 7 %   Lymphs Abs 1.1 0.7 - 4.0 K/uL   Monocytes Relative 7 %   Monocytes Absolute 1.1 (H) 0.1 - 1.0 K/uL   Eosinophils Relative 0 %   Eosinophils Absolute 0.1 0.0 - 0.7 K/uL   Basophils Relative 0 %   Basophils Absolute 0.0 0.0 - 0.1 K/uL    Comment: Performed at [facility] [address] [place], [place] [address]  Salicylate level     Status: None   Collection Time: 03/17/18  7:52 PM  Result Value Ref Range   Salicylate Lvl <7.4 2.8 - 30.0 mg/dL    Comment:  Performed at [facility] [address] [place], [place] [address]  Magnesium     Status: None   Collection Time: 03/17/18  7:52 PM  Result Value Ref Range   Magnesium 2.0 1.7 - 2.4 mg/dL    Comment: Performed at [facility], [place] [address], [place], [place] [address]  Phosphorus     Status: None   Collection Time: 03/17/18  7:52 PM  Result Value Ref Range   Phosphorus 4.4 2.5 - 4.6 mg/dL    Comment: Performed at [facility], [place] [address] [place] [address] [place], [place] [address]  Procalcitonin     Status: None   Collection Time: 03/17/18  7:52 PM  Result Value Ref Range   Procalcitonin <0.10 ng/mL    Comment:        Interpretation: PCT (Procalcitonin) <= 0.5 ng/mL: Systemic infection (sepsis) is not likely. Local bacterial infection is possible. (NOTE)       Sepsis PCT Algorithm  Lower Respiratory Tract                                      Infection PCT Algorithm    ----------------------------     ----------------------------         PCT < 0.25 ng/mL                PCT < 0.10 ng/mL         Strongly encourage             Strongly discourage   discontinuation of antibiotics    initiation of antibiotics    ----------------------------     -----------------------------       PCT 0.25 - 0.50 ng/mL            PCT 0.10 - 0.25 ng/mL               OR       >80% decrease in PCT            Discourage initiation of                                            antibiotics      Encourage discontinuation           of antibiotics    ----------------------------     -----------------------------         PCT >= 0.50 ng/mL              PCT 0.26 - 0.50 ng/mL               AND        <80% decrease in PCT             Encourage initiation of                                             antibiotics       Encourage continuation           of antibiotics    ----------------------------     -----------------------------        PCT >= 0.50 ng/mL                  PCT > 0.50 ng/mL                AND         increase in PCT                  Strongly encourage                                      initiation of antibiotics    Strongly encourage escalation           of antibiotics                                     -----------------------------  PCT <= 0.25 ng/mL                                                 OR                                        > 80% decrease in PCT                                     Discontinue / Do not initiate                                             antibiotics Performed at [facility], [place] [address], [place], [place] [address]   MRSA PCR Screening     Status: None   Collection Time: 03/18/18  2:23 AM  Result Value Ref Range   MRSA by PCR NEGATIVE NEGATIVE    Comment:        The GeneXpert MRSA Assay (FDA approved for NASAL specimens only), is one component of a comprehensive MRSA colonization surveillance program. It is not intended to diagnose MRSA infection nor to guide or monitor treatment for MRSA infections. Performed at [facility], [place] [address], [place], [place] [address]   Protime-INR     Status: Abnormal   Collection Time: 03/18/18  6:25 AM  Result Value Ref Range   Prothrombin Time 18.5 (H) 11.4 - 15.2 seconds   INR 1.56     Comment: Performed at [facility] [address], [place], [place] [address]  CBC     Status: Abnormal   Collection Time: 03/18/18  6:55 AM  Result Value Ref Range   WBC 12.9 (H) 4.0 - 10.5 K/uL   RBC 4.62 4.22 - 5.81 MIL/uL   Hemoglobin 12.9 (L) 13.0 - 17.0 g/dL   HCT 41.3 39.0 - 52.0 %   MCV 89.4 78.0 - 100.0 fL   MCH 27.9 26.0 - 34.0 pg   MCHC 31.2 30.0 - 36.0 g/dL   RDW 15.8 (H) 11.5 - 15.5 %   Platelets 222 150 - 400 K/uL    Comment: Performed at [facility], [place] [address] [place], [place] [address]  Basic metabolic panel     Status: Abnormal   Collection Time: 03/18/18  7:31 AM  Result Value Ref Range    Sodium 145 135 - 145 mmol/L   Potassium 3.3 (L) 3.5 - 5.1 mmol/L   Chloride 114 (H) 101 - 111 mmol/L   CO2 22 22 - 32 mmol/L   Glucose, Bld 116 (H) 65 - 99 mg/dL   BUN 6 6 - 20 mg/dL   Creatinine, Ser 0.81 0.61 - 1.24 mg/dL   Calcium 8.8 (L) 8.9 - 10.3 mg/dL   GFR calc non Af Amer >60 >60 mL/min   GFR calc Af Amer >60 >60 mL/min    Comment: (NOTE) The eGFR has been calculated using the CKD EPI equation. This calculation has not been validated in all clinical situations. eGFR's persistently <60 mL/min signify possible Chronic  Kidney Disease.    Anion gap 9 5 - 15    Comment: Performed at [facility] [address] [place], [place] [address]  Hepatic function panel     Status: Abnormal   Collection Time: 03/18/18  7:31 AM  Result Value Ref Range   Total Protein 6.0 (L) 6.5 - 8.1 g/dL   Albumin 3.1 (L) 3.5 - 5.0 g/dL   AST 43 (H) 15 - 41 U/L   ALT 50 17 - 63 U/L   Alkaline Phosphatase 80 38 - 126 U/L   Total Bilirubin 0.8 0.3 - 1.2 mg/dL   Bilirubin, Direct <0.1 (L) 0.1 - 0.5 mg/dL   Indirect Bilirubin NOT CALCULATED 0.3 - 0.9 mg/dL    Comment: Performed at [facility] [address] [place], [place] [address]  Magnesium     Status: None   Collection Time: 03/18/18  7:31 AM  Result Value Ref Range   Magnesium 1.8 1.7 - 2.4 mg/dL    Comment: Performed at [facility] [address], [place], [place] [address]  Phosphorus     Status: Abnormal   Collection Time: 03/18/18  7:31 AM  Result Value Ref Range   Phosphorus 2.0 (L) 2.5 - 4.6 mg/dL    Comment: Performed at [facility] [address], [place], [place] [address]  Glucose, capillary     Status: Abnormal   Collection Time: 03/18/18  7:57 AM  Result Value Ref Range   Glucose-Capillary 110 (H) 65 - 99 mg/dL  Glucose, capillary     Status: Abnormal   Collection Time: 03/18/18 11:19 AM  Result Value Ref Range   Glucose-Capillary 115 (H) 65 - 99 mg/dL  Glucose, capillary     Status: None    Collection Time: 03/18/18  4:56 PM  Result Value Ref Range   Glucose-Capillary 86 65 - 99 mg/dL  Acetaminophen level     Status: Abnormal   Collection Time: 03/18/18  7:15 PM  Result Value Ref Range   Acetaminophen (Tylenol), Serum <10 (L) 10 - 30 ug/mL    Comment:        THERAPEUTIC CONCENTRATIONS VARY SIGNIFICANTLY. A RANGE OF 10-30 ug/mL MAY BE AN EFFECTIVE CONCENTRATION FOR MANY PATIENTS. HOWEVER, SOME ARE BEST TREATED AT CONCENTRATIONS OUTSIDE THIS RANGE. ACETAMINOPHEN CONCENTRATIONS >150 ug/mL AT 4 HOURS AFTER INGESTION AND >50 ug/mL AT 12 HOURS AFTER INGESTION ARE OFTEN ASSOCIATED WITH TOXIC REACTIONS. Performed at [facility], [place] [address], [place], [place] [address]   Glucose, capillary     Status: Abnormal   Collection Time: 03/18/18  7:39 PM  Result Value Ref Range   Glucose-Capillary 100 (H) 65 - 99 mg/dL  Hepatic function panel     Status: Abnormal   Collection Time: 03/19/18 12:45 AM  Result Value Ref Range   Total Protein 5.6 (L) 6.5 - 8.1 g/dL   Albumin 2.9 (L) 3.5 - 5.0 g/dL   AST 55 (H) 15 - 41 U/L   ALT 66 (H) 17 - 63 U/L   Alkaline Phosphatase 87 38 - 126 U/L   Total Bilirubin 0.8 0.3 - 1.2 mg/dL   Bilirubin, Direct 0.2 0.1 - 0.5 mg/dL   Indirect Bilirubin 0.6 0.3 - 0.9 mg/dL    Comment: Performed at [facility], [place] [address], [place], [place] [address]  Acetaminophen level     Status: Abnormal   Collection Time: 03/19/18 12:46 AM  Result Value Ref Range  Acetaminophen (Tylenol), Serum <10 (L) 10 - 30 ug/mL    Comment:        THERAPEUTIC CONCENTRATIONS VARY SIGNIFICANTLY. A RANGE OF 10-30 ug/mL MAY BE AN EFFECTIVE CONCENTRATION FOR MANY PATIENTS. HOWEVER, SOME ARE BEST TREATED AT CONCENTRATIONS OUTSIDE THIS RANGE. ACETAMINOPHEN CONCENTRATIONS >150 ug/mL AT 4 HOURS AFTER INGESTION AND >50 ug/mL AT 12 HOURS AFTER INGESTION ARE OFTEN ASSOCIATED WITH TOXIC REACTIONS. Performed at [facility], [place] [address],  [place], [place] [address]   Glucose, capillary     Status: Abnormal   Collection Time: 03/19/18  1:06 AM  Result Value Ref Range   Glucose-Capillary 125 (H) 65 - 99 mg/dL  Glucose, capillary     Status: Abnormal   Collection Time: 03/19/18  3:49 AM  Result Value Ref Range   Glucose-Capillary 106 (H) 65 - 99 mg/dL  Acetaminophen level     Status: Abnormal   Collection Time: 03/19/18  4:26 AM  Result Value Ref Range   Acetaminophen (Tylenol), Serum <10 (L) 10 - 30 ug/mL    Comment:        THERAPEUTIC CONCENTRATIONS VARY SIGNIFICANTLY. A RANGE OF 10-30 ug/mL MAY BE AN EFFECTIVE CONCENTRATION FOR MANY PATIENTS. HOWEVER, SOME ARE BEST TREATED AT CONCENTRATIONS OUTSIDE THIS RANGE. ACETAMINOPHEN CONCENTRATIONS >150 ug/mL AT 4 HOURS AFTER INGESTION AND >50 ug/mL AT 12 HOURS AFTER INGESTION ARE OFTEN ASSOCIATED WITH TOXIC REACTIONS. Performed at [facility], [place] [address], [place], [place] [address]   Protime-INR     Status: Abnormal   Collection Time: 03/19/18  4:26 AM  Result Value Ref Range   Prothrombin Time 28.8 (H) 11.4 - 15.2 seconds   INR 2.74     Comment: Performed at [facility] [address], [place], [place] [address]  Comprehensive metabolic panel     Status: Abnormal   Collection Time: 03/19/18  6:27 AM  Result Value Ref Range   Sodium 140 135 - 145 mmol/L   Potassium 3.0 (L) 3.5 - 5.1 mmol/L   Chloride 111 101 - 111 mmol/L   CO2 18 (L) 22 - 32 mmol/L   Glucose, Bld 105 (H) 65 - 99 mg/dL   BUN <5 (L) 6 - 20 mg/dL   Creatinine, Ser 0.78 0.61 - 1.24 mg/dL   Calcium 8.7 (L) 8.9 - 10.3 mg/dL   Total Protein 5.5 (L) 6.5 - 8.1 g/dL   Albumin 2.8 (L) 3.5 - 5.0 g/dL   AST 86 (H) 15 - 41 U/L   ALT 109 (H) 17 - 63 U/L   Alkaline Phosphatase 83 38 - 126 U/L   Total Bilirubin 0.7 0.3 - 1.2 mg/dL   GFR calc non Af Amer >60 >60 mL/min   GFR calc Af Amer >60 >60 mL/min    Comment: (NOTE) The eGFR has been calculated using the CKD EPI equation. This  calculation has not been validated in all clinical situations. eGFR's persistently <60 mL/min signify possible Chronic Kidney Disease.    Anion gap 11 5 - 15    Comment: Performed at [facility] [address], [place], [place] [address]  Glucose, capillary     Status: Abnormal   Collection Time: 03/19/18  7:56 AM  Result Value Ref Range   Glucose-Capillary 118 (H) 65 - 99 mg/dL   Comment 1 Document in Chart     Current Facility-Administered Medications  Medication Dose Route Frequency Provider Last Rate Last Dose  . 0.9 %  sodium chloride infusion  250 mL Intravenous PRN Corey Harold, NP      . 0.9 %  sodium chloride infusion   Intravenous Continuous Corey Harold, NP 125 mL/hr at 03/19/18 0051    . acetylcysteine (ACETADOTE) 40,000 mg in dextrose 5 % 1,000 mL (40 mg/mL) infusion  15 mg/kg/hr Intravenous Continuous Erenest Blank, RPH 29.4 mL/hr at 03/18/18 2249 15 mg/kg/hr at 03/18/18 2249  . hydrALAZINE (APRESOLINE) injection 10 mg  10 mg Intravenous Q4H PRN Whiteheart, Kathryn A, NP      . insulin aspart (novoLOG) injection 1-3 Units  1-3 Units Subcutaneous Q4H Corey Harold, NP   1 Units at 03/19/18 0110  . levETIRAcetam (KEPPRA) IVPB 500 mg/100 mL premix  500 mg Intravenous Q12H Corey Harold, NP   Stopped at 03/19/18 0510  . ondansetron (ZOFRAN) injection 4 mg  4 mg Intravenous Q6H PRN Frederik Pear, MD   4 mg at 03/18/18 1719  . Oxcarbazepine (TRILEPTAL) tablet 300 mg  300 mg Oral TID Darlina Sicilian A, NP   300 mg at 03/19/18 0921  . pantoprazole (PROTONIX) EC tablet 40 mg  40 mg Oral Q0600 Rai, Ripudeep K, MD      . rivaroxaban (XARELTO) tablet 10 mg  10 mg Oral QPM Whiteheart, Kathryn A, NP   10 mg at 03/18/18 1721  . sertraline (ZOLOFT) tablet 50 mg  50 mg Oral Daily Whiteheart, Kathryn A, NP   50 mg at 03/19/18 0921  . sodium chloride flush (NS) 0.9 % injection 10-40 mL  10-40 mL Intracatheter PRN Scatliffe, Rise Paganini, MD      .  triamterene-hydrochlorothiazide (MAXZIDE-25) 37.5-25 MG per tablet 1 tablet  1 tablet Oral Daily Scatliffe, Rise Paganini, MD   1 tablet at 03/19/18 2458   Facility-Administered Medications Ordered in Other Encounters  Medication Dose Route Frequency Provider Last Rate Last Dose  . sodium chloride 0.9 % injection 10 mL  10 mL Intracatheter PRN Evlyn Kanner, NP   10 mL at 11/07/15 1012    Musculoskeletal: Strength & Muscle Tone: within normal limits Gait & Station: UTA since patient was lying in bed. Patient leans: N/A  Psychiatric Specialty Exam: Physical Exam  Nursing note and vitals reviewed. Constitutional: He is oriented to person, place, and time. He appears well-developed and well-nourished.  HENT:  Head: Normocephalic and atraumatic.  Neck: Normal range of motion.  Respiratory: Effort normal.  Musculoskeletal: Normal range of motion.  Neurological: He is alert and oriented to person, place, and time.  Skin: No rash noted.  Psychiatric: He has a normal mood and affect. His speech is normal and behavior is normal. Thought content normal. Cognition and memory are normal. He expresses impulsivity.    Review of Systems  Constitutional: Negative for chills and fever.  Cardiovascular: Negative for chest pain.  Gastrointestinal: Negative for abdominal pain, constipation, diarrhea, nausea and vomiting.  Psychiatric/Behavioral: Positive for depression, hallucinations and substance abuse. Negative for suicidal ideas. The patient does not have insomnia.   All other systems reviewed and are negative.   Blood pressure 132/71, pulse (!) 102, temperature 99.9 F (37.7 C), temperature source Oral, resp. rate 16, height '5\' 9"'$  (1.753 m), weight 79.5 kg (175 lb 4.3 oz), SpO2 94 %.Body mass index is 25.88 kg/m.  General Appearance: Fairly Groomed, middle aged, African American male with a bald head and hospital gown who is lying in bed. NAD.   Eye Contact:  Good  Speech:  Clear and Coherent  and Normal Rate  Volume:  Normal  Mood:  Euthymic  Affect:  Appropriate and Congruent  Thought Process:  Goal Directed, Linear and Descriptions of Associations: Intact  Orientation:  Full (Time, Place, and Person)  Thought Content:  Logical  Suicidal Thoughts:  No  Homicidal Thoughts:  No  Memory:  Immediate;   Good Recent;   Good Remote;   Good  Judgement:  Fair  Insight:  Fair  Psychomotor Activity:  Normal  Concentration:  Concentration: Good and Attention Span: Good  Recall:  Good  Fund of Knowledge:  Good  Language:  Good  Akathisia:  No  Handed:  Right  AIMS (if indicated):   N/A  Assets:  Communication Skills Housing Social Support  ADL's:  Impaired  Cognition:  WNL  Sleep:   Poor   Assessment:  Chayse Zatarain is a 57 y.o. male who was admitted with Percocet overdose. He denies suicide attempt but it is clear that he is not forthcoming with information. He reports recent stressors which likely contributed to an impulsive suicide attempt by overdosing. His mother reports that he has been depressed and she believes that his overdose was intentional. He warrants inpatient psychiatric hospitalization for stabilization and treatment.   Treatment Plan Summary: -Patient warrants inpatient psychiatric hospitalization given high risk of harm to self. -Continue Engineer, materials.  -Restart home medications: Zoloft 50 mg daily for depression/anxiety, Atarax 25 mg TID PRN for anxiety and Trazodone 50 mg qhs PRN for insomnia.  -Start Risperdal 0.5 mg BID for psychosis.  -Please pursue involuntary commitment if patient refuses voluntary psychiatric hospitalization or attempts to leave the hospital.  -Will sign off on patient at this time. Please consult psychiatry again as needed.   Disposition: Recommend psychiatric Inpatient admission when medically cleared.  Faythe Dingwall, DO 03/19/2018 11:22 AM

## 2018-03-20 ENCOUNTER — Inpatient Hospital Stay (HOSPITAL_COMMUNITY): Payer: 59

## 2018-03-20 LAB — COMPREHENSIVE METABOLIC PANEL
ALT: 153 U/L — ABNORMAL HIGH (ref 17–63)
AST: 71 U/L — ABNORMAL HIGH (ref 15–41)
Albumin: 2.2 g/dL — ABNORMAL LOW (ref 3.5–5.0)
Alkaline Phosphatase: 74 U/L (ref 38–126)
Anion gap: 7 (ref 5–15)
BILIRUBIN TOTAL: 0.8 mg/dL (ref 0.3–1.2)
BUN: <5 mg/dL — ABNORMAL LOW (ref 6–20)
CHLORIDE: 111 mmol/L (ref 101–111)
CO2: 22 mmol/L (ref 22–32)
Calcium: 8.2 mg/dL — ABNORMAL LOW (ref 8.9–10.3)
Creatinine, Ser: 0.74 mg/dL (ref 0.61–1.24)
Glucose, Bld: 110 mg/dL — ABNORMAL HIGH (ref 65–99)
POTASSIUM: 3.1 mmol/L — AB (ref 3.5–5.1)
Sodium: 140 mmol/L (ref 135–145)
TOTAL PROTEIN: 5 g/dL — AB (ref 6.5–8.1)

## 2018-03-20 LAB — GLUCOSE, CAPILLARY
GLUCOSE-CAPILLARY: 106 mg/dL — AB (ref 65–99)
GLUCOSE-CAPILLARY: 125 mg/dL — AB (ref 65–99)
GLUCOSE-CAPILLARY: 162 mg/dL — AB (ref 65–99)
Glucose-Capillary: 126 mg/dL — ABNORMAL HIGH (ref 65–99)
Glucose-Capillary: 146 mg/dL — ABNORMAL HIGH (ref 65–99)
Glucose-Capillary: 127 mg/dL — ABNORMAL HIGH (ref 65–99)

## 2018-03-20 LAB — URINALYSIS, ROUTINE W REFLEX MICROSCOPIC
BILIRUBIN URINE: NEGATIVE
Glucose, UA: NEGATIVE mg/dL
HGB URINE DIPSTICK: NEGATIVE
KETONES UR: NEGATIVE mg/dL
Leukocytes, UA: NEGATIVE
Nitrite: NEGATIVE
Protein, ur: NEGATIVE mg/dL
SPECIFIC GRAVITY, URINE: 1.01 (ref 1.005–1.030)
pH: 5 (ref 5.0–8.0)

## 2018-03-20 LAB — CBC
HEMATOCRIT: 32.7 % — AB (ref 39.0–52.0)
Hemoglobin: 10.6 g/dL — ABNORMAL LOW (ref 13.0–17.0)
MCH: 27.5 pg (ref 26.0–34.0)
MCHC: 32.4 g/dL (ref 30.0–36.0)
MCV: 84.7 fL (ref 78.0–100.0)
Platelets: 149 10*3/uL — ABNORMAL LOW (ref 150–400)
RBC: 3.86 MIL/uL — AB (ref 4.22–5.81)
RDW: 15.2 % (ref 11.5–15.5)
WBC: 15.3 10*3/uL — AB (ref 4.0–10.5)

## 2018-03-20 LAB — PROTIME-INR
INR: 2.79
Prothrombin Time: 29.2 seconds — ABNORMAL HIGH (ref 11.4–15.2)

## 2018-03-20 LAB — PROCALCITONIN: Procalcitonin: 14.17 ng/mL

## 2018-03-20 MED ORDER — IBUPROFEN 600 MG PO TABS
600.0000 mg | ORAL_TABLET | Freq: Once | ORAL | Status: AC
  Administered 2018-03-20: 600 mg via ORAL
  Filled 2018-03-20: qty 1

## 2018-03-20 MED ORDER — CEFTRIAXONE SODIUM 1 G IJ SOLR
1.0000 g | INTRAMUSCULAR | Status: AC
  Administered 2018-03-20 – 2018-03-26 (×7): 1 g via INTRAVENOUS
  Filled 2018-03-20 (×7): qty 10

## 2018-03-20 MED ORDER — SODIUM CHLORIDE 0.9 % IV SOLN
500.0000 mg | INTRAVENOUS | Status: DC
  Administered 2018-03-20 – 2018-03-21 (×2): 500 mg via INTRAVENOUS
  Filled 2018-03-20 (×3): qty 500

## 2018-03-20 MED ORDER — DIPHENHYDRAMINE HCL 50 MG/ML IJ SOLN
12.5000 mg | Freq: Once | INTRAMUSCULAR | Status: AC
  Administered 2018-03-20: 12.5 mg via INTRAVENOUS
  Filled 2018-03-20: qty 1

## 2018-03-20 MED ORDER — KETOROLAC TROMETHAMINE 15 MG/ML IJ SOLN
15.0000 mg | Freq: Once | INTRAMUSCULAR | Status: AC
  Administered 2018-03-20: 15 mg via INTRAVENOUS
  Filled 2018-03-20: qty 1

## 2018-03-20 MED ORDER — POTASSIUM CHLORIDE CRYS ER 20 MEQ PO TBCR
40.0000 meq | EXTENDED_RELEASE_TABLET | Freq: Once | ORAL | Status: AC
  Administered 2018-03-20: 40 meq via ORAL
  Filled 2018-03-20: qty 2

## 2018-03-20 MED ORDER — METOCLOPRAMIDE HCL 5 MG/ML IJ SOLN
10.0000 mg | Freq: Once | INTRAMUSCULAR | Status: AC
  Administered 2018-03-20: 10 mg via INTRAVENOUS
  Filled 2018-03-20: qty 2

## 2018-03-20 MED ORDER — TRAMADOL HCL 50 MG PO TABS
50.0000 mg | ORAL_TABLET | Freq: Once | ORAL | Status: AC
  Administered 2018-03-20: 50 mg via ORAL
  Filled 2018-03-20: qty 1

## 2018-03-20 NOTE — Progress Notes (Addendum)
[facility]                                                                              Patient Demographics  [name], is a 57 y.o. male, DOB - 20-Jun-1961, ZOX:[id number]  Admit date - 03/17/2018   Admitting Physician Carin Hock, MD  Outpatient Primary MD for the patient is Fleet Contras, MD  Outpatient specialists:   LOS - 2  days   Medical records reviewed and are as summarized below:    Chief Complaint  Patient presents with  . Drug Overdose       Brief summary   Admitted by CCM to ICU on 8/76 57 year old male with PMH as below, which is significant for MS, Chronic pain, Depression, DVT, and gait abnormality. His family noted him to be minimally responsive on the evening hours of 5/8 and called EMS. Upon their arrival the patient had agonal respirations, but was pulsatile. Responded to intranasal narcan. He remained somnolent upon arrival to ED and was given IV narcan. He responded very well and was started on infusion. Patient reports taking 15 tablets of Percocet 10-325. He claims overdose was accidental and he had no intention of harming himself. Currently denies SI. He claims he also took tylenol and ASA. Smoker. Denies drinking and illegal drug use. PCCM asked to admit for Narcan infusion  Assessment & Plan    Principal Problem:   Overdose by acetaminophen, accidental or unintentional, initial encounter -Patient alert and oriented, after Narcan infusion continue to hold all sedating drugs.  Tylenol level 138 on admission, currently <10.  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 -Continue Xarelto     Multiple sclerosis (HCC) -Currently no flare,  -Continue Keppra, Trileptal  Hypokalemia Potassium 3.1, replaced    GERD (gastroesophageal  reflux disease) -Continue Protonix    Essential hypertension - Currently stable, continue triamterene HCTZ    Right shoulder pain, chronic pain syndrome -Patient reports right shoulder has been hurting for last 2 weeks, fell on it  -Right shoulder x-ray negative for any fracture dislocation  Percocet overdose -Appreciate psychiatry recommendations, started on Zoloft, Atarax, trazodone, Risperdal 0.5 mg twice a day for psychosis -If LFTs continue to improve tomorrow, patient should be medically clear for inpatient psych  Fever   No clear symptoms, obtain procalcitonin, blood cultures, UA, chest x-ray Addendum: PCT 14.1, CXR shows multilobar PNA - placed on azithromax and IV rocephin   Headache possibly migraine  - will give toradol, reglan and benadryl x1    Code Status: Full DVT Prophylaxis: Xarelto Family Communication: Discussed in detail with the patient, all imaging results, lab results explained to the patient    Disposition Plan: Discontinue IV NAC, follow LFTs in a.m., if continues to improve, [facility] tomorrow   Time Spent in minutes   25 minutes  Procedures:  None  Consultants:   Patient was admitted by CCM Psychiatry  Antimicrobials:      Medications  Scheduled Meds: .  insulin aspart  1-3 Units Subcutaneous Q4H  . Oxcarbazepine  300 mg Oral TID  . pantoprazole  40 mg Oral Q0600  . risperiDONE  0.5 mg Oral BID  . rivaroxaban  10 mg Oral QPM  . sertraline  50 mg Oral Daily  . triamterene-hydrochlorothiazide  1 tablet Oral Daily   Continuous Infusions: . sodium chloride    . sodium chloride 125 mL/hr at 03/20/18 1006  . levETIRAcetam Stopped (03/20/18 0612)   PRN Meds:.sodium chloride, hydrALAZINE, hydrOXYzine, ondansetron (ZOFRAN) IV, sodium chloride flush, traZODone   Antibiotics   Anti-infectives (From admission, onward)   None        Subjective:   [name] was seen and examined today.  No complaints today, stable, low-grade temp  of 100.9 F overnight.   Patient denies dizziness, chest pain, shortness of breath, abdominal pain, N/V/D/C, new weakness, numbess, tingling. No acute events overnight.    Objective:   Vitals:   03/19/18 2159 03/19/18 2215 03/20/18 0414 03/20/18 0813  BP: (!) 138/59  121/70 (!) 104/57  Pulse: (!) 108  94 89  Resp: (!) 32  18 16  Temp: (!) 100.9 F (38.3 C)  98.4 F (36.9 C) 98.5 F (36.9 C)  TempSrc: Oral   Oral  SpO2: (!) 87% 92% 91% 94%  Weight:      Height:        Intake/Output Summary (Last 24 hours) at 03/20/2018 1127 Last data filed at 03/20/2018 0600 Gross per 24 hour  Intake 3372.1 ml  Output 2650 ml  Net 722.1 ml     Wt Readings from Last 3 Encounters:  03/19/18 79.5 kg (175 lb 4.3 oz)  03/01/18 78.5 kg (173 lb)  12/19/17 83.5 kg (184 lb)     Exam   General: Alert and oriented x 3, NAD  Eyes:   HEENT:    Cardiovascular: S1 S2 auscultated, Regular rate and rhythm. No pedal edema b/l  Respiratory: Clear to auscultation bilaterally, no wheezing, rales or rhonchi  Gastrointestinal: Soft, nontender, nondistended, + bowel sounds  Ext: no pedal edema bilaterally  Neuro: no new deficits  Musculoskeletal: No digital cyanosis, clubbing  Skin: No rashes  Psych: Normal affect and demeanor, alert and oriented x3     Data Reviewed:  I have personally reviewed following labs and imaging studies  Micro Results Recent Results (from the past 240 hour(s))  MRSA PCR Screening     Status: None   Collection Time: 03/18/18  2:23 AM  Result Value Ref Range Status   MRSA by PCR NEGATIVE NEGATIVE Final    Comment:        The GeneXpert MRSA Assay (FDA approved for NASAL specimens only), is one component of a comprehensive MRSA colonization surveillance program. It is not intended to diagnose MRSA infection nor to guide or monitor treatment for MRSA infections. Performed at [facility], [address] [place], [place] [address]     Radiology  Reports Dg Chest 2 View  Result Date: 03/04/2018 CLINICAL DATA:  Chest pain EXAM: CHEST - 2 VIEW COMPARISON:  12/19/2017 FINDINGS: Right Port-A-Cath remains in place, unchanged. Heart and mediastinal contours are within normal limits. No focal opacities or effusions. No acute bony abnormality. IMPRESSION: No active cardiopulmonary disease. Electronically Signed   By: Charlett Nose M.D.   On: 03/04/2018 10:17   Dg Shoulder Right  Result Date: 03/19/2018 CLINICAL DATA:  Pain after a fall.  Multiple sclerosis. EXAM: RIGHT SHOULDER - 2+ VIEW COMPARISON:  None.  FINDINGS: There is no evidence of fracture or dislocation. There is no evidence of arthropathy or other focal bone abnormality. Soft tissues are unremarkable. IMPRESSION: Negative. Electronically Signed   By: Elsie Stain M.D.   On: 03/19/2018 23:17   Ct Head Wo Contrast  Result Date: 03/04/2018 CLINICAL DATA:  Chest pain and weakness for a week. Headache and right arm pain. EXAM: CT HEAD WITHOUT CONTRAST TECHNIQUE: Contiguous axial images were obtained from the base of the skull through the vertex without intravenous contrast. COMPARISON:  12/21/2017 FINDINGS: BRAIN: The ventricles and sulci are normal. No intraparenchymal hemorrhage, mass effect nor midline shift. No acute large vascular territory infarcts. Grey-white matter distinction is maintained. The basal ganglia are unremarkable. No abnormal extra-axial fluid collections. Basal cisterns are not effaced and midline. The brainstem and cerebellar hemispheres are without acute abnormalities. VASCULAR: No hyperdense vessel or unexpected calcification. SKULL/SOFT TISSUES: No acute calvarial fracture. No significant soft tissue swelling. ORBITS/SINUSES: The included ocular globes and orbital contents are normal.The mastoid air cells are clear. The included paranasal sinuses are well-aerated. OTHER: None. IMPRESSION: No acute abnormality noted.  Stable appearance of the brain. Electronically Signed    By: Tollie Eth M.D.   On: 03/04/2018 19:14    Lab Data:  CBC: Recent Labs  Lab 03/17/18 1952 03/18/18 0655 03/20/18 0453  WBC 15.3* 12.9* 15.3*  NEUTROABS 13.2*  --   --   HGB 12.9* 12.9* 10.6*  HCT 40.9 41.3 32.7*  MCV 89.7 89.4 84.7  PLT 260 222 149*   Basic Metabolic Panel: Recent Labs  Lab 03/17/18 1952 03/18/18 0731 03/19/18 0627 03/20/18 0453  NA 144 145 140 140  K 3.3* 3.3* 3.0* 3.1*  CL 115* 114* 111 111  CO2 21* 22 18* 22  GLUCOSE 220* 116* 105* 110*  BUN 6 6 <5* <5*  CREATININE 1.00 0.81 0.78 0.74  CALCIUM 8.9 8.8* 8.7* 8.2*  MG 2.0 1.8  --   --   PHOS 4.4 2.0*  --   --    GFR: Estimated Creatinine Clearance: 101.9 mL/min (by C-G formula based on SCr of 0.74 mg/dL). Liver Function Tests: Recent Labs  Lab 03/18/18 0731 03/19/18 0045 03/19/18 0627 03/19/18 1658 03/20/18 0453  AST 43* 55* 86* 112* 71*  ALT 50 66* 109* 162* 153*  ALKPHOS 80 87 83 85 74  BILITOT 0.8 0.8 0.7 0.8 0.8  PROT 6.0* 5.6* 5.5* 5.4* 5.0*  ALBUMIN 3.1* 2.9* 2.8* 2.7* 2.2*   No results for input(s): LIPASE, AMYLASE in the last 168 hours. No results for input(s): AMMONIA in the last 168 hours. Coagulation Profile: Recent Labs  Lab 03/18/18 0625 03/19/18 0426 03/20/18 0453  INR 1.56 2.74 2.79   Cardiac Enzymes: No results for input(s): CKTOTAL, CKMB, CKMBINDEX, TROPONINI in the last 168 hours. BNP (last 3 results) No results for input(s): PROBNP in the last 8760 hours. HbA1C: No results for input(s): HGBA1C in the last 72 hours. CBG: Recent Labs  Lab 03/19/18 1626 03/19/18 2009 03/20/18 0044 03/20/18 0411 03/20/18 0747  GLUCAP 133* 134* 162* 106* 126*   Lipid Profile: No results for input(s): CHOL, HDL, LDLCALC, TRIG, CHOLHDL, LDLDIRECT in the last 72 hours. Thyroid Function Tests: No results for input(s): TSH, T4TOTAL, FREET4, T3FREE, THYROIDAB in the last 72 hours. Anemia Panel: No results for input(s): VITAMINB12, FOLATE, FERRITIN, TIBC, IRON, RETICCTPCT  in the last 72 hours. Urine analysis:    Component Value Date/Time   COLORURINE YELLOW 03/04/2018 1943   APPEARANCEUR CLEAR 03/04/2018  1943   LABSPEC 1.009 03/04/2018 1943   PHURINE 6.0 03/04/2018 1943   GLUCOSEU NEGATIVE 03/04/2018 1943   HGBUR NEGATIVE 03/04/2018 1943   BILIRUBINUR NEGATIVE 03/04/2018 1943   KETONESUR NEGATIVE 03/04/2018 1943   PROTEINUR NEGATIVE 03/04/2018 1943   UROBILINOGEN 1.0 05/14/2014 2113   NITRITE NEGATIVE 03/04/2018 1943   LEUKOCYTESUR NEGATIVE 03/04/2018 1943     [name] M.D. [facility] 03/20/2018, 11:27 AM  Pager: [phone] Between 7am to 7pm - call Pager - [phone]  After 7pm go to [email or web address] - password [email or web address]  Call night coverage person covering after 7pm

## 2018-03-20 NOTE — Progress Notes (Signed)
MEDICATION RELATED CONSULT NOTE - FOLLOW UP  Pharmacy Consult for IV Acetylcysteine  Indication: Accidental APAP overdose  Allergies  Allergen Reactions  . Acyclovir And Related     Patient Measurements: Height: 5\' 9"  (175.3 cm) Weight: 175 lb 4.3 oz (79.5 kg) IBW/kg (Calculated) : 70.7  Vital Signs: Temp: 98.5 F (36.9 C) (05/11 0813) Temp Source: Oral (05/11 0813) BP: 104/57 (05/11 0813) Pulse Rate: 89 (05/11 0813)  Labs: Recent Labs    03/17/18 1952 03/18/18 8119 03/18/18 0731 03/19/18 0045 03/19/18 0627 03/19/18 1658 03/20/18 0453  WBC 15.3* 12.9*  --   --   --   --  15.3*  HGB 12.9* 12.9*  --   --   --   --  10.6*  HCT 40.9 41.3  --   --   --   --  32.7*  PLT 260 222  --   --   --   --  149*  CREATININE 1.00  --  0.81  --  0.78  --  0.74  MG 2.0  --  1.8  --   --   --   --   PHOS 4.4  --  2.0*  --   --   --   --   ALBUMIN 3.6  --  3.1* 2.9* 2.8* 2.7* 2.2*  PROT 7.0  --  6.0* 5.6* 5.5* 5.4* 5.0*  AST 19  --  43* 55* 86* 112* 71*  ALT 15*  --  50 66* 109* 162* 153*  ALKPHOS 98  --  80 87 83 85 74  BILITOT 0.3  --  0.8 0.8 0.7 0.8 0.8  BILIDIR  --   --  <0.1* 0.2  --  0.1  --   IBILI  --   --  NOT CALCULATED 0.6  --  0.7  --      Assessment: Acute toxic encephalopathy secondary to accidental opioid and APAP overdose and started on IV N-Acetylcysteine.   Initial APAP level 136, now down to <10 Initial LFTs 19/15, initially climbed 43/50 >> 86/109 >> 112/162 however now trending back down and 71/153 this AM (5/11) INR is falsely elevated in the setting of Xarelto use at 2.79  Discussed with poison control today and they are okay with discontinuing the N-acetylcysteine since APAP levels are negative and the LFTs are trending down.   Plan:  Recommendation from poison control is to d/c N-acetylcysteine infusion Pharmacy will sign off of consult  Thank you for allowing pharmacy to be a part of this patient's care.  Georgina Pillion, PharmD, BCPS Clinical  Pharmacist Pager: [phone] Clinical phone for 03/20/2018 from 7a-3:30p: [phone] If after 3:30p, please call main pharmacy at: [phone] 03/20/2018 10:38 AM

## 2018-03-21 LAB — URINE CULTURE: Culture: NO GROWTH

## 2018-03-21 LAB — CBC
HCT: 31.8 % — ABNORMAL LOW (ref 39.0–52.0)
Hemoglobin: 10.4 g/dL — ABNORMAL LOW (ref 13.0–17.0)
MCH: 27.6 pg (ref 26.0–34.0)
MCHC: 32.7 g/dL (ref 30.0–36.0)
MCV: 84.4 fL (ref 78.0–100.0)
PLATELETS: 141 10*3/uL — AB (ref 150–400)
RBC: 3.77 MIL/uL — ABNORMAL LOW (ref 4.22–5.81)
RDW: 15 % (ref 11.5–15.5)
WBC: 11.5 10*3/uL — AB (ref 4.0–10.5)

## 2018-03-21 LAB — COMPREHENSIVE METABOLIC PANEL
ALK PHOS: 73 U/L (ref 38–126)
ALT: 110 U/L — AB (ref 17–63)
AST: 32 U/L (ref 15–41)
Albumin: 2.2 g/dL — ABNORMAL LOW (ref 3.5–5.0)
Anion gap: 6 (ref 5–15)
BUN: 7 mg/dL (ref 6–20)
CALCIUM: 7.9 mg/dL — AB (ref 8.9–10.3)
CHLORIDE: 115 mmol/L — AB (ref 101–111)
CO2: 20 mmol/L — ABNORMAL LOW (ref 22–32)
CREATININE: 0.72 mg/dL (ref 0.61–1.24)
GFR calc Af Amer: >60 mL/min (ref >60)
Glucose, Bld: 89 mg/dL (ref 65–99)
Potassium: 3.1 mmol/L — ABNORMAL LOW (ref 3.5–5.1)
Sodium: 141 mmol/L (ref 135–145)
Total Bilirubin: 0.9 mg/dL (ref 0.3–1.2)
Total Protein: 5 g/dL — ABNORMAL LOW (ref 6.5–8.1)

## 2018-03-21 LAB — GLUCOSE, CAPILLARY
GLUCOSE-CAPILLARY: 99 mg/dL (ref 65–99)
GLUCOSE-CAPILLARY: 104 mg/dL — AB (ref 65–99)
Glucose-Capillary: 118 mg/dL — ABNORMAL HIGH (ref 65–99)
Glucose-Capillary: 122 mg/dL — ABNORMAL HIGH (ref 65–99)
Glucose-Capillary: 126 mg/dL — ABNORMAL HIGH (ref 65–99)
Glucose-Capillary: 145 mg/dL — ABNORMAL HIGH (ref 65–99)

## 2018-03-21 LAB — PROTIME-INR
INR: 2.04
PROTHROMBIN TIME: 22.9 s — AB (ref 11.4–15.2)

## 2018-03-21 MED ORDER — TOPIRAMATE 100 MG PO TABS
100.0000 mg | ORAL_TABLET | Freq: Every day | ORAL | Status: DC
  Administered 2018-03-21 – 2018-04-08 (×19): 100 mg via ORAL
  Filled 2018-03-21 (×19): qty 1

## 2018-03-21 MED ORDER — POTASSIUM CHLORIDE CRYS ER 20 MEQ PO TBCR
40.0000 meq | EXTENDED_RELEASE_TABLET | Freq: Once | ORAL | Status: AC
  Administered 2018-03-21: 40 meq via ORAL
  Filled 2018-03-21: qty 2

## 2018-03-21 MED ORDER — SUMATRIPTAN SUCCINATE 100 MG PO TABS
100.0000 mg | ORAL_TABLET | ORAL | Status: DC | PRN
Start: 2018-03-21 — End: 2018-04-09
  Administered 2018-03-21 – 2018-04-09 (×16): 100 mg via ORAL
  Filled 2018-03-21 (×17): qty 1

## 2018-03-21 NOTE — Progress Notes (Signed)
[facility]                                                                              Patient Demographics  [name], is a 57 y.o. male, DOB - Aug 30, 1961, [id number]  Admit date - 03/17/2018   Admitting Physician Carin Hock, MD  Outpatient Primary MD for the patient is Fleet Contras, MD  Outpatient specialists:   LOS - 3  days   Medical records reviewed and are as summarized below:    Chief Complaint  Patient presents with  . Drug Overdose       Brief summary   Admitted by CCM to ICU on 64/63 56 year old male with PMH as below, which is significant for MS, Chronic pain, Depression, DVT, and gait abnormality. His family noted him to be minimally responsive on the evening hours of 5/8 and called EMS. Upon their arrival the patient had agonal respirations, but was pulsatile. Responded to intranasal narcan. He remained somnolent upon arrival to ED and was given IV narcan. He responded very well and was started on infusion. Patient reports taking 15 tablets of Percocet 10-325. He claims overdose was accidental and he had no intention of harming himself. Currently denies SI. He claims he also took tylenol and ASA. Smoker. Denies drinking and illegal drug use. PCCM asked to admit for Narcan infusion  Assessment & Plan    Principal Problem:   Overdose by acetaminophen, accidental or unintentional, initial encounter -Patient alert and oriented, after Narcan infusion continue to hold all sedating drugs.  Tylenol level 138 on admission, currently <10.  ASA negative -LFTs trending down,  DC IV Nacetylcysteine.  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 -Continue Xarelto    Multiple sclerosis (HCC) -Currently no flare,  -Continue Keppra, Trileptal  Hypokalemia Potassium 3.1, replaced    GERD (gastroesophageal reflux disease) -Continue Protonix    Essential hypertension - Currently stable, continue  triamterene HCTZ    Right shoulder pain, chronic pain syndrome -Patient reports right shoulder has been hurting for last 2 weeks, fell on it  -Right shoulder x-ray negative for any fracture dislocation  Percocet overdose -Appreciate psychiatry recommendations, started on Zoloft, Atarax, trazodone, Risperdal 0.5 mg twice a day for psychosis -If LFTs continue to improve tomorrow, patient should be medically clear for inpatient psych  Bilateral pneumonia  - PCT 14.1, CXR shows multilobar PNA - placed on azithromax and IV rocephin   Migraine headaches Placed back on Topamax, Imitrex as needed   Code Status: Full DVT Prophylaxis: Xarelto Family Communication: Discussed in detail with the patient, all imaging results, lab results explained to the patient    Disposition Plan: Patient should be okay for [facility] inpatient tomorrow if afebrile   Time Spent in minutes   25 minutes  Procedures:  None  Consultants:   Patient was admitted by CCM Psychiatry  Antimicrobials:      Medications  Scheduled Meds: . insulin aspart  1-3 Units Subcutaneous Q4H  . Oxcarbazepine  300 mg Oral TID  . pantoprazole  40 mg Oral Q0600  . risperiDONE  0.5 mg  Oral BID  . rivaroxaban  10 mg Oral QPM  . sertraline  50 mg Oral Daily  . topiramate  100 mg Oral QHS  . triamterene-hydrochlorothiazide  1 tablet Oral Daily   Continuous Infusions: . azithromycin Stopped (03/20/18 2023)  . cefTRIAXone (ROCEPHIN)  IV Stopped (03/20/18 1808)  . levETIRAcetam Stopped (03/21/18 0549)   PRN Meds:.hydrALAZINE, hydrOXYzine, ondansetron (ZOFRAN) IV, sodium chloride flush, SUMAtriptan, traZODone   Antibiotics   Anti-infectives (From admission, onward)   Start     Dose/Rate Route Frequency Ordered Stop   03/20/18 1545  azithromycin (ZITHROMAX) 500 mg in sodium chloride 0.9 % 250 mL IVPB     500 mg 250 mL/hr over 60 Minutes Intravenous Every 24 hours 03/20/18 1531     03/20/18 1545  cefTRIAXone (ROCEPHIN) 1 g  in sodium chloride 0.9 % 100 mL IVPB     1 g 200 mL/hr over 30 Minutes Intravenous Every 24 hours 03/20/18 1531          Subjective:   [name] was seen and examined today.  Spiked fever overnight 101.1 F.  complains of headache, typical of his migraines.  Patient denies dizziness, chest pain, shortness of breath, abdominal pain, N/V/D/C, new weakness, numbess, tingling.  Objective:   Vitals:   03/20/18 1412 03/20/18 2056 03/20/18 2057 03/21/18 0423  BP: 134/85 137/75 137/75 121/67  Pulse: 86 90 90 77  Resp: (!) 28  (!) 22 20  Temp: 98.5 F (36.9 C) (!) 101.5 F (38.6 C) (!) 101.5 F (38.6 C) 98.2 F (36.8 C)  TempSrc: Oral Oral Oral Oral  SpO2: 92% 100% 100% 96%  Weight:      Height:        Intake/Output Summary (Last 24 hours) at 03/21/2018 1216 Last data filed at 03/21/2018 1058 Gross per 24 hour  Intake 850 ml  Output 2000 ml  Net -1150 ml     Wt Readings from Last 3 Encounters:  03/19/18 79.5 kg (175 lb 4.3 oz)  03/01/18 78.5 kg (173 lb)  12/19/17 83.5 kg (184 lb)     Exam   General: Alert and oriented x 3, NAD  Eyes:   HEENT:  Atraumatic  Cardiovascular: S1 S2 auscultated, Regular rate and rhythm. No pedal edema b/l  Respiratory: Decreased breath sound at the bases  Gastrointestinal: Soft, nontender, nondistended, + bowel sounds  Ext: no pedal edema bilaterally  Neuro: no new deficit  Musculoskeletal: No digital cyanosis, clubbing  Skin: No rashes  Psych: Normal affect and demeanor, alert and oriented x3    Data Reviewed:  I have personally reviewed following labs and imaging studies  Micro Results Recent Results (from the past 240 hour(s))  MRSA PCR Screening     Status: None   Collection Time: 03/18/18  2:23 AM  Result Value Ref Range Status   MRSA by PCR NEGATIVE NEGATIVE Final    Comment:        The GeneXpert MRSA Assay (FDA approved for NASAL specimens only), is one component of a comprehensive MRSA  colonization surveillance program. It is not intended to diagnose MRSA infection nor to guide or monitor treatment for MRSA infections. Performed at [facility], [address], [place], [place] [address]   Urine Culture     Status: None   Collection Time: 03/20/18 11:33 AM  Result Value Ref Range Status   Specimen Description URINE, CLEAN CATCH  Final   Special Requests NONE  Final   Culture   Final  NO GROWTH Performed at [facility], [address], [place], [place] [address]    Report Status 03/21/2018 FINAL  Final  Culture, blood (routine x 2)     Status: None (Preliminary result)   Collection Time: 03/20/18 12:33 PM  Result Value Ref Range Status   Specimen Description BLOOD BLOOD LEFT HAND  Final   Special Requests   Final    BOTTLES DRAWN AEROBIC ONLY Blood Culture results may not be optimal due to an inadequate volume of blood received in culture bottles   Culture   Final    NO GROWTH < 24 HOURS Performed at [facility], [address] [place], [place] [address]    Report Status PENDING  Incomplete    Radiology Reports Dg Chest 2 View  Result Date: 03/04/2018 CLINICAL DATA:  Chest pain EXAM: CHEST - 2 VIEW COMPARISON:  12/19/2017 FINDINGS: Right Port-A-Cath remains in place, unchanged. Heart and mediastinal contours are within normal limits. No focal opacities or effusions. No acute bony abnormality. IMPRESSION: No active cardiopulmonary disease. Electronically Signed   By: Charlett Nose M.D.   On: 03/04/2018 10:17   Dg Shoulder Right  Result Date: 03/19/2018 CLINICAL DATA:  Pain after a fall.  Multiple sclerosis. EXAM: RIGHT SHOULDER - 2+ VIEW COMPARISON:  None. FINDINGS: There is no evidence of fracture or dislocation. There is no evidence of arthropathy or other focal bone abnormality. Soft tissues are unremarkable. IMPRESSION: Negative. Electronically Signed   By: Elsie Stain M.D.   On: 03/19/2018 23:17   Ct Head Wo Contrast  Result  Date: 03/04/2018 CLINICAL DATA:  Chest pain and weakness for a week. Headache and right arm pain. EXAM: CT HEAD WITHOUT CONTRAST TECHNIQUE: Contiguous axial images were obtained from the base of the skull through the vertex without intravenous contrast. COMPARISON:  12/21/2017 FINDINGS: BRAIN: The ventricles and sulci are normal. No intraparenchymal hemorrhage, mass effect nor midline shift. No acute large vascular territory infarcts. Grey-white matter distinction is maintained. The basal ganglia are unremarkable. No abnormal extra-axial fluid collections. Basal cisterns are not effaced and midline. The brainstem and cerebellar hemispheres are without acute abnormalities. VASCULAR: No hyperdense vessel or unexpected calcification. SKULL/SOFT TISSUES: No acute calvarial fracture. No significant soft tissue swelling. ORBITS/SINUSES: The included ocular globes and orbital contents are normal.The mastoid air cells are clear. The included paranasal sinuses are well-aerated. OTHER: None. IMPRESSION: No acute abnormality noted.  Stable appearance of the brain. Electronically Signed   By: Tollie Eth M.D.   On: 03/04/2018 19:14   Dg Chest Port 1 View  Result Date: 03/20/2018 CLINICAL DATA:  Fever EXAM: PORTABLE CHEST 1 VIEW COMPARISON:  03/04/2018 FINDINGS: Hazy airspace disease in bilateral upper lobes and bilateral lower lobes most severe in the right lower lobe most concerning for multilobar pneumonia. No significant pleural effusion or pneumothorax. Stable cardiomediastinal silhouette. Right-sided Port-A-Cath with the tip projecting over the cavoatrial junction. No acute osseous abnormality. IMPRESSION: Findings concerning for multilobar pneumonia. Electronically Signed   By: Elige Ko   On: 03/20/2018 13:22    Lab Data:  CBC: Recent Labs  Lab 03/17/18 1952 03/18/18 0655 03/20/18 0453 03/21/18 0333  WBC 15.3* 12.9* 15.3* 11.5*  NEUTROABS 13.2*  --   --   --   HGB 12.9* 12.9* 10.6* 10.4*  HCT 40.9  41.3 32.7* 31.8*  MCV 89.7 89.4 84.7 84.4  PLT 260 222 149* 141*   Basic Metabolic Panel: Recent Labs  Lab 03/17/18 1952 03/18/18 0731  03/19/18 0627 03/20/18 0453 03/21/18 0333  NA 144 145 140 140 141  K 3.3* 3.3* 3.0* 3.1* 3.1*  CL 115* 114* 111 111 115*  CO2 21* 22 18* 22 20*  GLUCOSE 220* 116* 105* 110* 89  BUN 6 6 <5* <5* 7  CREATININE 1.00 0.81 0.78 0.74 0.72  CALCIUM 8.9 8.8* 8.7* 8.2* 7.9*  MG 2.0 1.8  --   --   --   PHOS 4.4 2.0*  --   --   --    GFR: Estimated Creatinine Clearance: 101.9 mL/min (by C-G formula based on SCr of 0.72 mg/dL). Liver Function Tests: Recent Labs  Lab 03/19/18 0045 03/19/18 0627 03/19/18 1658 03/20/18 0453 03/21/18 0333  AST 55* 86* 112* 71* 32  ALT 66* 109* 162* 153* 110*  ALKPHOS 87 83 85 74 73  BILITOT 0.8 0.7 0.8 0.8 0.9  PROT 5.6* 5.5* 5.4* 5.0* 5.0*  ALBUMIN 2.9* 2.8* 2.7* 2.2* 2.2*   No results for input(s): LIPASE, AMYLASE in the last 168 hours. No results for input(s): AMMONIA in the last 168 hours. Coagulation Profile: Recent Labs  Lab 03/18/18 0625 03/19/18 0426 03/20/18 0453 03/21/18 0333  INR 1.56 2.74 2.79 2.04   Cardiac Enzymes: No results for input(s): CKTOTAL, CKMB, CKMBINDEX, TROPONINI in the last 168 hours. BNP (last 3 results) No results for input(s): PROBNP in the last 8760 hours. HbA1C: No results for input(s): HGBA1C in the last 72 hours. CBG: Recent Labs  Lab 03/20/18 1718 03/20/18 2014 03/20/18 2336 03/21/18 0418 03/21/18 0741  GLUCAP 146* 127* 126* 122* 99   Lipid Profile: No results for input(s): CHOL, HDL, LDLCALC, TRIG, CHOLHDL, LDLDIRECT in the last 72 hours. Thyroid Function Tests: No results for input(s): TSH, T4TOTAL, FREET4, T3FREE, THYROIDAB in the last 72 hours. Anemia Panel: No results for input(s): VITAMINB12, FOLATE, FERRITIN, TIBC, IRON, RETICCTPCT in the last 72 hours. Urine analysis:    Component Value Date/Time   COLORURINE YELLOW 03/20/2018 1411   APPEARANCEUR  CLEAR 03/20/2018 1411   LABSPEC 1.010 03/20/2018 1411   PHURINE 5.0 03/20/2018 1411   GLUCOSEU NEGATIVE 03/20/2018 1411   HGBUR NEGATIVE 03/20/2018 1411   BILIRUBINUR NEGATIVE 03/20/2018 1411   KETONESUR NEGATIVE 03/20/2018 1411   PROTEINUR NEGATIVE 03/20/2018 1411   UROBILINOGEN 1.0 05/14/2014 2113   NITRITE NEGATIVE 03/20/2018 1411   LEUKOCYTESUR NEGATIVE 03/20/2018 1411     [name] M.D. [facility] 03/21/2018, 12:16 PM  Pager: [phone] Between 7am to 7pm - call Pager - [phone]  After 7pm go to [email or web address] - password [email or web address]  Call night coverage person covering after 7pm

## 2018-03-22 ENCOUNTER — Inpatient Hospital Stay (HOSPITAL_COMMUNITY): Payer: 59

## 2018-03-22 LAB — COMPREHENSIVE METABOLIC PANEL
ALT: 108 U/L — AB (ref 17–63)
AST: 51 U/L — ABNORMAL HIGH (ref 15–41)
Albumin: 2.5 g/dL — ABNORMAL LOW (ref 3.5–5.0)
Alkaline Phosphatase: 69 U/L (ref 38–126)
Anion gap: 9 (ref 5–15)
BUN: <5 mg/dL — ABNORMAL LOW (ref 6–20)
CALCIUM: 8.5 mg/dL — AB (ref 8.9–10.3)
CHLORIDE: 111 mmol/L (ref 101–111)
CO2: 21 mmol/L — ABNORMAL LOW (ref 22–32)
CREATININE: 0.7 mg/dL (ref 0.61–1.24)
GFR calc Af Amer: >60 mL/min (ref >60)
GFR calc non Af Amer: >60 mL/min (ref >60)
Glucose, Bld: 106 mg/dL — ABNORMAL HIGH (ref 65–99)
Potassium: 3.4 mmol/L — ABNORMAL LOW (ref 3.5–5.1)
Sodium: 141 mmol/L (ref 135–145)
Total Bilirubin: 0.8 mg/dL (ref 0.3–1.2)
Total Protein: 6.2 g/dL — ABNORMAL LOW (ref 6.5–8.1)

## 2018-03-22 LAB — PROTIME-INR
INR: 1.47
PROTHROMBIN TIME: 17.7 s — AB (ref 11.4–15.2)

## 2018-03-22 LAB — CBC
HCT: 33.9 % — ABNORMAL LOW (ref 39.0–52.0)
Hemoglobin: 11.1 g/dL — ABNORMAL LOW (ref 13.0–17.0)
MCH: 28 pg (ref 26.0–34.0)
MCHC: 32.7 g/dL (ref 30.0–36.0)
MCV: 85.4 fL (ref 78.0–100.0)
PLATELETS: 181 10*3/uL (ref 150–400)
RBC: 3.97 MIL/uL — AB (ref 4.22–5.81)
RDW: 14.8 % (ref 11.5–15.5)
WBC: 10.4 10*3/uL (ref 4.0–10.5)

## 2018-03-22 LAB — GLUCOSE, CAPILLARY
Glucose-Capillary: 103 mg/dL — ABNORMAL HIGH (ref 65–99)
Glucose-Capillary: 111 mg/dL — ABNORMAL HIGH (ref 65–99)
Glucose-Capillary: 121 mg/dL — ABNORMAL HIGH (ref 65–99)
Glucose-Capillary: 140 mg/dL — ABNORMAL HIGH (ref 65–99)
Glucose-Capillary: 147 mg/dL — ABNORMAL HIGH (ref 65–99)

## 2018-03-22 MED ORDER — AZITHROMYCIN 500 MG PO TABS
500.0000 mg | ORAL_TABLET | ORAL | Status: AC
  Administered 2018-03-22 – 2018-03-26 (×5): 500 mg via ORAL
  Filled 2018-03-22 (×5): qty 1

## 2018-03-22 MED ORDER — GADOBENATE DIMEGLUMINE 529 MG/ML IV SOLN
20.0000 mL | Freq: Once | INTRAVENOUS | Status: AC
  Administered 2018-03-22: 20 mL via INTRAVENOUS

## 2018-03-22 MED ORDER — LEVETIRACETAM 500 MG PO TABS
500.0000 mg | ORAL_TABLET | Freq: Two times a day (BID) | ORAL | Status: DC
  Administered 2018-03-22 – 2018-04-09 (×36): 500 mg via ORAL
  Filled 2018-03-22 (×36): qty 1

## 2018-03-22 MED ORDER — BACLOFEN 20 MG PO TABS
20.0000 mg | ORAL_TABLET | Freq: Three times a day (TID) | ORAL | Status: DC
  Administered 2018-03-22 – 2018-04-09 (×54): 20 mg via ORAL
  Filled 2018-03-22 (×54): qty 1

## 2018-03-22 MED ORDER — POTASSIUM CHLORIDE CRYS ER 20 MEQ PO TBCR
40.0000 meq | EXTENDED_RELEASE_TABLET | Freq: Once | ORAL | Status: AC
  Administered 2018-03-22: 40 meq via ORAL
  Filled 2018-03-22: qty 2

## 2018-03-22 MED ORDER — GABAPENTIN 600 MG PO TABS
600.0000 mg | ORAL_TABLET | Freq: Three times a day (TID) | ORAL | Status: DC
  Administered 2018-03-22 – 2018-04-09 (×54): 600 mg via ORAL
  Filled 2018-03-22 (×54): qty 1

## 2018-03-22 NOTE — Progress Notes (Signed)
Pt states he would like to restart his regular home meds, including Gabapentin, Baclofen and states he wants to start Prednisone. Text sent to Dr Isidoro Donning

## 2018-03-22 NOTE — Evaluation (Signed)
Physical Therapy Evaluation Patient Details Name: [name] MRN: [id number] DOB: June 18, 1961 Today's Date: 03/22/2018   History of Present Illness  57 year old male with PMH as below, which is significant for MS, Chronic pain, Depression, DVT, and gait abnormality.Pt admitted for minimally responsiveness due pt taking 15 tabs of Perfocet 10-325. Denies SI.  Clinical Impression  Pt admitted with above. Pt very agitated regarding not being on his home meds for his MS. Suspect pt could ambulate with assist however would only std pvt to chair with minA. Acute PT to con't to follow.    Follow Up Recommendations Other (comment)(per chart pt going to inpatient psych), otherwise pt may need ST-SNF unless pt progresses to supervision level of assist    Equipment Recommendations  None recommended by PT    Recommendations for Other Services       Precautions / Restrictions Precautions Precautions: Fall Precaution Comments: sitter due to possible suicide attempt Restrictions Weight Bearing Restrictions: No      Mobility  Bed Mobility Overal bed mobility: Needs Assistance Bed Mobility: Rolling;Sidelying to Sit Rolling: Supervision Sidelying to sit: Min assist       General bed mobility comments: minA for trunk elevation and  R LE management  Transfers Overall transfer level: Needs assistance Equipment used: 2 person hand held assist Transfers: Sit to/from [employer] Sit to Stand: Min assist Stand pivot transfers: Min assist       General transfer comment: pt able to push up from bed and use PT and sitter to balance and take 3 steps to chair  Ambulation/Gait             General Gait Details: pt adamantly refused due to not having his meds " I can't do anything without my meds, I'Gary getting weaker by the day"  Stairs            Wheelchair Mobility    Modified Rankin (Stroke Patients Only)       Balance Overall balance assessment: Needs  assistance Sitting-balance support: Feet supported;No upper extremity supported Sitting balance-Leahy Scale: Fair     Standing balance support: Bilateral upper extremity supported Standing balance-Leahy Scale: Poor Standing balance comment: needs external support at this time                             Pertinent Vitals/Pain Pain Assessment: (states pain but never described or stated where)    Home Living Family/patient expects to be discharged to:: Other (Comment)                 Additional Comments: most likely inpatient behavior health    Prior Function Level of Independence: Needs assistance   Gait / Transfers Assistance Needed: pt reports he uses RW vs w/c depending on how he feels that day. Can transfer to w/c without assist  ADL's / Homemaking Assistance Needed: pt reports he does his own ADL's, chart states, pt reports he has aide 7 days per week for 8 hours per day. aide assists with bathing, dressing, and household tasks        Hand Dominance        Extremity/Trunk Assessment   Upper Extremity Assessment Upper Extremity Assessment: Generalized weakness    Lower Extremity Assessment Lower Extremity Assessment: Generalized weakness    Cervical / Trunk Assessment Cervical / Trunk Assessment: Kyphotic  Communication   Communication: Expressive difficulties  Cognition Arousal/Alertness: Awake/alert Behavior During Therapy: Agitated(regarding not being  give home meds) Overall Cognitive Status: No family/caregiver present to determine baseline cognitive functioning                                 General Comments: pt with inconsistent report of PLOF. pt very persistant on asking for "I need my MS meds" "I can't do anything until I get those meds"      General Comments General comments (skin integrity, edema, etc.): spoke with RN regarding his agitation regarding not getting his MS meds    Exercises     Assessment/Plan     PT Assessment Patient needs continued PT services  PT Problem List Decreased strength;Decreased range of motion;Decreased activity tolerance;Decreased balance;Decreased mobility;Decreased coordination;Decreased cognition;Decreased knowledge of use of DME       PT Treatment Interventions DME instruction;Gait training;Stair training;Functional mobility training;Therapeutic activities;Therapeutic exercise;Balance training    PT Goals (Current goals can be found in the Care Plan section)  Acute Rehab PT Goals Patient Stated Goal: get my meds straight PT Goal Formulation: With patient Time For Goal Achievement: 04/05/18 Potential to Achieve Goals: Fair    Frequency Min 3X/week   Barriers to discharge        Co-evaluation               AM-PAC PT "6 Clicks" Daily Activity  Outcome Measure Difficulty turning over in bed (including adjusting bedclothes, sheets and blankets)?: Unable Difficulty moving from lying on back to sitting on the side of the bed? : Unable Difficulty sitting down on and standing up from a chair with arms (e.g., wheelchair, bedside commode, etc,.)?: Unable Help needed moving to and from a bed to chair (including a wheelchair)?: A Little Help needed walking in hospital room?: A Lot Help needed climbing 3-5 steps with a railing? : Total 6 Click Score: 9    End of Session Equipment Utilized During Treatment: Gait belt Activity Tolerance: Treatment limited secondary to agitation Patient left: in chair;with call bell/phone within reach;with nursing/sitter in room Nurse Communication: Mobility status PT Visit Diagnosis: Unsteadiness on feet (R26.81)    Time: 0355-9741 PT Time Calculation (min) (ACUTE ONLY): 17 min   Charges:   PT Evaluation $PT Eval Moderate Complexity: 1 Mod     PT G CodesLewis Perez, PT, DPT Pager #: [phone] Office #: [phone]   [name] [name] 03/22/2018, 2:34 PM

## 2018-03-22 NOTE — Progress Notes (Signed)
[facility]                                                                              Patient Demographics  [name], is a 57 y.o. male, DOB - 1961/07/05, JWJ:[id number]  Admit date - 03/17/2018   Admitting Physician Carin Hock, MD  Outpatient Primary MD for the patient is Fleet Contras, MD  Outpatient specialists:   LOS - 4  days   Medical records reviewed and are as summarized below:    Chief Complaint  Patient presents with  . Drug Overdose       Brief summary   Admitted by CCM to ICU on 60/34 57 year old male with PMH as below, which is significant for MS, Chronic pain, Depression, DVT, and gait abnormality. His family noted him to be minimally responsive on the evening hours of 5/8 and called EMS. Upon their arrival the patient had agonal respirations, but was pulsatile. Responded to intranasal narcan. He remained somnolent upon arrival to ED and was given IV narcan. He responded very well and was started on infusion. Patient reports taking 15 tablets of Percocet 10-325. He claims overdose was accidental and he had no intention of harming himself. Currently denies SI. He claims he also took tylenol and ASA. Smoker. Denies drinking and illegal drug use. PCCM asked to admit for Narcan infusion  Assessment & Plan    Principal Problem:   Overdose by acetaminophen, accidental or unintentional, initial encounter - Patient had arrived to ED with minimal responsiveness and agonal respirations.  He received Narcan IV and responded very well to it, patient had reported taking 15 tablets of Percocet 10/325. -He was initially admitted to ICU, on Narcan infusion.  Currently alert and oriented Tylenol level 138 on admission, currently <10.  ASA negative -LFTs trending down, hence an acetylcysteine was discontinued.  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 Wilford Corner, patient had MRI of the brain and C-spine done on 12/20/2017, recommended to repeat MRI of the brain and C-spine with and without contrast.  If patient has new enhancements or lesions, will recommend treatment with IV steroids and will follow-up.  If no new enhancements/lesions on MRIs,patient can be discharged to [facility] and continue physical therapy -At baseline, states he is wheelchair-bound.   - Placed on baclofen 20 mg 3 times daily, he also receives Ocrevus infusion every 6 months, followed by neurology outpatient Dr. Tinnie Gens. -Continue Keppra, Trileptal  Hypokalemia Replaced    GERD (gastroesophageal reflux disease) -Continue Protonix    Essential hypertension - Currently stable, continue triamterene HCTZ    Right shoulder pain, chronic pain syndrome -Patient reports right shoulder has been hurting for last 2 weeks, fell on it  -Right shoulder x-ray negative for any fracture dislocation  Percocet overdose -Appreciate psychiatry recommendations, started on Zoloft, Atarax, trazodone, Risperdal 0.5 mg twice a day for psychosis -If MRI brain and C-spine negative, will be medically clear for discharge to behavioral health  Bilateral  pneumonia  - PCT 14.1, CXR shows multilobar PNA - placed on azithromax and IV rocephin till 5/17  Migraine headaches Placed back on Topamax, Imitrex as needed   Code Status: Full DVT Prophylaxis: Xarelto Family Communication: Discussed in detail with the patient, all imaging results, lab results explained to the patient    Disposition Plan: Patient should be okay for [facility] inpatient tomorrow if afebrile   Time Spent in minutes   25 minutes  Procedures:  None  Consultants:   Patient was admitted by CCM Psychiatry  Antimicrobials:      Medications  Scheduled Meds: . azithromycin  500 mg Oral Q24H  . insulin aspart  1-3 Units  Subcutaneous Q4H  . levETIRAcetam  500 mg Oral BID  . Oxcarbazepine  300 mg Oral TID  . pantoprazole  40 mg Oral Q0600  . risperiDONE  0.5 mg Oral BID  . rivaroxaban  10 mg Oral QPM  . sertraline  50 mg Oral Daily  . topiramate  100 mg Oral QHS  . triamterene-hydrochlorothiazide  1 tablet Oral Daily   Continuous Infusions: . cefTRIAXone (ROCEPHIN)  IV Stopped (03/21/18 1713)   PRN Meds:.hydrALAZINE, hydrOXYzine, ondansetron (ZOFRAN) IV, sodium chloride flush, SUMAtriptan, traZODone   Antibiotics   Anti-infectives (From admission, onward)   Start     Dose/Rate Route Frequency Ordered Stop   03/22/18 1600  azithromycin (ZITHROMAX) tablet 500 mg     500 mg Oral Every 24 hours 03/22/18 1126     03/20/18 1545  azithromycin (ZITHROMAX) 500 mg in sodium chloride 0.9 % 250 mL IVPB  Status:  Discontinued     500 mg 250 mL/hr over 60 Minutes Intravenous Every 24 hours 03/20/18 1531 03/22/18 1124   03/20/18 1545  cefTRIAXone (ROCEPHIN) 1 g in sodium chloride 0.9 % 100 mL IVPB     1 g 200 mL/hr over 30 Minutes Intravenous Every 24 hours 03/20/18 1531          Subjective:   [name] was seen and examined today. Upset over discharge to [facility], states he feels he has MS flare and needs rehab on "4th floor". Headache better.   Patient denies dizziness, chest pain, shortness of breath, abdominal pain, N/V/D/C.  Objective:   Vitals:   03/21/18 0423 03/21/18 1650 03/21/18 2219 03/22/18 0528  BP: 121/67 134/90 (!) 161/83 126/74  Pulse: 77 93 91 86  Resp: 20 (!) 21 16 18   Temp: 98.2 F (36.8 C) 99.3 F (37.4 C) (!) 97.5 F (36.4 C) 98.2 F (36.8 C)  TempSrc: Oral Oral Oral Oral  SpO2: 96% 97% 90% 90%  Weight:      Height:        Intake/Output Summary (Last 24 hours) at 03/22/2018 1230 Last data filed at 03/22/2018 1045 Gross per 24 hour  Intake 600 ml  Output 1800 ml  Net -1200 ml     Wt Readings from Last 3 Encounters:  03/19/18 79.5 kg (175 lb 4.3 oz)  03/01/18 78.5  kg (173 lb)  12/19/17 83.5 kg (184 lb)     Exam    General: Alert and oriented x 3, NAD  Eyes:   HEENT:  Atraumatic  Cardiovascular: S1 S2 auscultated,  RRR. No pedal edema b/l  Respiratory: CTA B  Gastrointestinal: Soft, nontender, nondistended, + bowel sounds  Ext: no pedal edema bilaterally  Neuro: weakness in his legs, also poor effort  Musculoskeletal: No digital cyanosis, clubbing  Skin: No rashes  Psych: Normal affect and demeanor, alert and  oriented x3    Data Reviewed:  I have personally reviewed following labs and imaging studies  Micro Results Recent Results (from the past 240 hour(s))  MRSA PCR Screening     Status: None   Collection Time: 03/18/18  2:23 AM  Result Value Ref Range Status   MRSA by PCR NEGATIVE NEGATIVE Final    Comment:        The GeneXpert MRSA Assay (FDA approved for NASAL specimens only), is one component of a comprehensive MRSA colonization surveillance program. It is not intended to diagnose MRSA infection nor to guide or monitor treatment for MRSA infections. Performed at [facility], [address] [place], [place] [address]   Urine Culture     Status: None   Collection Time: 03/20/18 11:33 AM  Result Value Ref Range Status   Specimen Description URINE, CLEAN CATCH  Final   Special Requests NONE  Final   Culture   Final    NO GROWTH Performed at [facility], [address] [place], [place] [address]    Report Status 03/21/2018 FINAL  Final  Culture, blood (routine x 2)     Status: None (Preliminary result)   Collection Time: 03/20/18 12:33 PM  Result Value Ref Range Status   Specimen Description BLOOD BLOOD LEFT HAND  Final   Special Requests   Final    BOTTLES DRAWN AEROBIC ONLY Blood Culture results may not be optimal due to an inadequate volume of blood received in culture bottles   Culture   Final    NO GROWTH < 24 HOURS Performed at [facility], [address], [place], [place]  [address]    Report Status PENDING  Incomplete    Radiology Reports Dg Chest 2 View  Result Date: 03/04/2018 CLINICAL DATA:  Chest pain EXAM: CHEST - 2 VIEW COMPARISON:  12/19/2017 FINDINGS: Right Port-A-Cath remains in place, unchanged. Heart and mediastinal contours are within normal limits. No focal opacities or effusions. No acute bony abnormality. IMPRESSION: No active cardiopulmonary disease. Electronically Signed   By: Charlett Nose M.D.   On: 03/04/2018 10:17   Dg Shoulder Right  Result Date: 03/19/2018 CLINICAL DATA:  Pain after a fall.  Multiple sclerosis. EXAM: RIGHT SHOULDER - 2+ VIEW COMPARISON:  None. FINDINGS: There is no evidence of fracture or dislocation. There is no evidence of arthropathy or other focal bone abnormality. Soft tissues are unremarkable. IMPRESSION: Negative. Electronically Signed   By: Elsie Stain M.D.   On: 03/19/2018 23:17   Ct Head Wo Contrast  Result Date: 03/04/2018 CLINICAL DATA:  Chest pain and weakness for a week. Headache and right arm pain. EXAM: CT HEAD WITHOUT CONTRAST TECHNIQUE: Contiguous axial images were obtained from the base of the skull through the vertex without intravenous contrast. COMPARISON:  12/21/2017 FINDINGS: BRAIN: The ventricles and sulci are normal. No intraparenchymal hemorrhage, mass effect nor midline shift. No acute large vascular territory infarcts. Grey-white matter distinction is maintained. The basal ganglia are unremarkable. No abnormal extra-axial fluid collections. Basal cisterns are not effaced and midline. The brainstem and cerebellar hemispheres are without acute abnormalities. VASCULAR: No hyperdense vessel or unexpected calcification. SKULL/SOFT TISSUES: No acute calvarial fracture. No significant soft tissue swelling. ORBITS/SINUSES: The included ocular globes and orbital contents are normal.The mastoid air cells are clear. The included paranasal sinuses are well-aerated. OTHER: None. IMPRESSION: No acute abnormality  noted.  Stable appearance of the brain. Electronically Signed   By: Rene Kocher.D.  On: 03/04/2018 19:14   Dg Chest Port 1 View  Result Date: 03/20/2018 CLINICAL DATA:  Fever EXAM: PORTABLE CHEST 1 VIEW COMPARISON:  03/04/2018 FINDINGS: Hazy airspace disease in bilateral upper lobes and bilateral lower lobes most severe in the right lower lobe most concerning for multilobar pneumonia. No significant pleural effusion or pneumothorax. Stable cardiomediastinal silhouette. Right-sided Port-A-Cath with the tip projecting over the cavoatrial junction. No acute osseous abnormality. IMPRESSION: Findings concerning for multilobar pneumonia. Electronically Signed   By: Elige Ko   On: 03/20/2018 13:22    Lab Data:  CBC: Recent Labs  Lab 03/17/18 1952 03/18/18 1610 03/20/18 0453 03/21/18 0333 03/22/18 0332  WBC 15.3* 12.9* 15.3* 11.5* 10.4  NEUTROABS 13.2*  --   --   --   --   HGB 12.9* 12.9* 10.6* 10.4* 11.1*  HCT 40.9 41.3 32.7* 31.8* 33.9*  MCV 89.7 89.4 84.7 84.4 85.4  PLT 260 222 149* 141* 181   Basic Metabolic Panel: Recent Labs  Lab 03/17/18 1952 03/18/18 0731 03/19/18 0627 03/20/18 0453 03/21/18 0333 03/22/18 0332  NA 144 145 140 140 141 141  K 3.3* 3.3* 3.0* 3.1* 3.1* 3.4*  CL 115* 114* 111 111 115* 111  CO2 21* 22 18* 22 20* 21*  GLUCOSE 220* 116* 105* 110* 89 106*  BUN 6 6 <5* <5* 7 <5*  CREATININE 1.00 0.81 0.78 0.74 0.72 0.70  CALCIUM 8.9 8.8* 8.7* 8.2* 7.9* 8.5*  MG 2.0 1.8  --   --   --   --   PHOS 4.4 2.0*  --   --   --   --    GFR: Estimated Creatinine Clearance: 101.9 mL/min (by C-G formula based on SCr of 0.7 mg/dL). Liver Function Tests: Recent Labs  Lab 03/19/18 0627 03/19/18 1658 03/20/18 0453 03/21/18 0333 03/22/18 0332  AST 86* 112* 71* 32 51*  ALT 109* 162* 153* 110* 108*  ALKPHOS 83 85 74 73 69  BILITOT 0.7 0.8 0.8 0.9 0.8  PROT 5.5* 5.4* 5.0* 5.0* 6.2*  ALBUMIN 2.8* 2.7* 2.2* 2.2* 2.5*   No results for input(s): LIPASE, AMYLASE in the  last 168 hours. No results for input(s): AMMONIA in the last 168 hours. Coagulation Profile: Recent Labs  Lab 03/18/18 0625 03/19/18 0426 03/20/18 0453 03/21/18 0333 03/22/18 0332  INR 1.56 2.74 2.79 2.04 1.47   Cardiac Enzymes: No results for input(s): CKTOTAL, CKMB, CKMBINDEX, TROPONINI in the last 168 hours. BNP (last 3 results) No results for input(s): PROBNP in the last 8760 hours. HbA1C: No results for input(s): HGBA1C in the last 72 hours. CBG: Recent Labs  Lab 03/21/18 1653 03/21/18 2001 03/22/18 0006 03/22/18 0343 03/22/18 0739  GLUCAP 104* 118* 111* 103* 121*   Lipid Profile: No results for input(s): CHOL, HDL, LDLCALC, TRIG, CHOLHDL, LDLDIRECT in the last 72 hours. Thyroid Function Tests: No results for input(s): TSH, T4TOTAL, FREET4, T3FREE, THYROIDAB in the last 72 hours. Anemia Panel: No results for input(s): VITAMINB12, FOLATE, FERRITIN, TIBC, IRON, RETICCTPCT in the last 72 hours. Urine analysis:    Component Value Date/Time   COLORURINE YELLOW 03/20/2018 1411   APPEARANCEUR CLEAR 03/20/2018 1411   LABSPEC 1.010 03/20/2018 1411   PHURINE 5.0 03/20/2018 1411   GLUCOSEU NEGATIVE 03/20/2018 1411   HGBUR NEGATIVE 03/20/2018 1411   BILIRUBINUR NEGATIVE 03/20/2018 1411   KETONESUR NEGATIVE 03/20/2018 1411   PROTEINUR NEGATIVE 03/20/2018 1411   UROBILINOGEN 1.0 05/14/2014 2113   NITRITE NEGATIVE 03/20/2018 1411   LEUKOCYTESUR NEGATIVE 03/20/2018 1411  Thad Ranger M.D. [facility] 03/22/2018, 12:30 PM  Pager: [phone] Between 7am to 7pm - call Pager - [phone]  After 7pm go to [email or web address] - password [email or web address]  Call night coverage person covering after 7pm

## 2018-03-22 NOTE — Progress Notes (Signed)
CSW sent referral to [facility] for review.   Jonna Dittrich LCSW [phone]  

## 2018-03-22 NOTE — Evaluation (Addendum)
Occupational Therapy Evaluation Patient Details Name: [name] MRN: [id number] DOB: 08-12-1961 Today's Date: 03/22/2018    History of Present Illness 57 year old male with PMH as below, which is significant for MS, Chronic pain, Depression, DVT, and gait abnormality.Pt admitted for minimally responsiveness due pt taking 15 tabs of Perfocet 10-325. Denies SI.   Clinical Impression   This 57 y/o male presents with the above. Pt reports independence with ADL completion PTA, however is questionable historian. Pt self-limiting this session and refusing bed mobility, EOB or OOB activity despite multiple attempts, reporting he is too weak and increasingly agitated with attempts made. Pt completed simple grooming ADLs with setup/supervision assist at bed level; presenting with generalized weakness and cognitive impairments, requiring cues to attend to tasks and to follow therapist's directions. Per chart pt most likely to d/c to behavioral health, if pt not to d/c to behavioral health recommend SNF level therapies prior to return home. Will continue to follow acutely to assess pt's functional mobility and ADL status.    Follow Up Recommendations  Other (comment);Supervision/Assistance - 24 hour(per chart pt most likely to d/c to behavioral health)    Equipment Recommendations  Other (comment)(to be assessed in next venue )           Precautions / Restrictions Precautions Precautions: Fall Precaution Comments: sitter due to possible suicide attempt Restrictions Weight Bearing Restrictions: No      Mobility Bed Mobility Overal bed mobility: Needs Assistance Bed Mobility: Rolling;Sidelying to Sit Rolling: Supervision Sidelying to sit: Min assist       General bed mobility comments: pt refusing bed mobility or EOB/OOB activity this session, stating "I'm too weak"    Transfers Overall transfer level: Needs assistance Equipment used: 2 person hand held assist Transfers: Sit  to/from [employer] Sit to Stand: Min assist Stand pivot transfers: Min assist       General transfer comment: pt able to push up from bed and use PT and sitter to balance and take 3 steps to chair    Balance Overall balance assessment: Needs assistance Sitting-balance support: Feet supported;No upper extremity supported Sitting balance-Leahy Scale: Fair     Standing balance support: Bilateral upper extremity supported Standing balance-Leahy Scale: Poor Standing balance comment: needs external support at this time                           ADL either performed or assessed with clinical judgement   ADL Overall ADL's : Needs assistance/impaired     Grooming: Wash/dry face;Set up;Supervision/safety;Bed level                                 General ADL Comments: pt refusing bed mobility or EOB activity despite multiple attempts; difficult to redirect due to cognitive impairments and pt perseverating during session; setup for simple grooming ADLs at bed level; currently requiring total assist for additional ADL completion moreso due to pt refusal vs pt's physical ability      Vision Baseline Vision/History: Wears glasses Wears Glasses: At all times Patient Visual Report: Blurring of vision Additional Comments: to be further assessed                 Pertinent Vitals/Pain Pain Assessment: No/denies pain          Extremity/Trunk Assessment Upper Extremity Assessment Upper Extremity Assessment: Generalized weakness(RUE slightly weaker than LUE )  Lower Extremity Assessment Lower Extremity Assessment: Defer to PT evaluation   Cervical / Trunk Assessment Cervical / Trunk Assessment: Kyphotic   Communication Communication Communication: Expressive difficulties   Cognition Arousal/Alertness: Awake/alert Behavior During Therapy: WFL for tasks assessed/performed;Agitated(intermittently agitated when attempting to have pt mobilize  ) Overall Cognitive Status: No family/caregiver present to determine baseline cognitive functioning Area of Impairment: Attention;Memory;Following commands                   Current Attention Level: Sustained Memory: Decreased recall of precautions;Decreased short-term memory Following Commands: Follows one step commands with increased time       General Comments: pt requires frequent cues to attend to therapist instructions; pt tangential at times and perseverating on needing "his steroids" before he can move   General Comments  spoke with RN regarding his agitation regarding not getting his MS meds               Home Living Family/patient expects to be discharged to:: Other (Comment)                                 Additional Comments: most likely inpatient behavior health      Prior Functioning/Environment Level of Independence: Needs assistance  Gait / Transfers Assistance Needed: pt reports he uses RW vs w/c depending on how he feels that day. Can transfer to w/c without assist ADL's / Homemaking Assistance Needed: pt reports he does his own ADL's, chart states, pt reports he has aide 7 days per week for 8 hours per day. aide assists with bathing, dressing, and household tasks            OT Problem List: Decreased strength;Decreased cognition;Decreased activity tolerance;Impaired balance (sitting and/or standing)      OT Treatment/Interventions: Self-care/ADL training;DME and/or AE instruction;Therapeutic activities;Balance training;Cognitive remediation/compensation;Therapeutic exercise;Patient/family education    OT Goals(Current goals can be found in the care plan section) Acute Rehab OT Goals Patient Stated Goal: get my meds straight OT Goal Formulation: With patient Time For Goal Achievement: 04/05/18 Potential to Achieve Goals: Good  OT Frequency: Min 2X/week                             AM-PAC PT "6 Clicks" Daily Activity      Outcome Measure Help from another person eating meals?: A Little Help from another person taking care of personal grooming?: A Little Help from another person toileting, which includes using toliet, bedpan, or urinal?: Total Help from another person bathing (including washing, rinsing, drying)?: Total Help from another person to put on and taking off regular upper body clothing?: Total Help from another person to put on and taking off regular lower body clothing?: Total 6 Click Score: 10   End of Session Nurse Communication: Mobility status  Activity Tolerance: Other (comment)(pt self-limiting ) Patient left: in bed;with call bell/phone within reach;with nursing/sitter in room  OT Visit Diagnosis: Muscle weakness (generalized) (M62.81)                Time: 1610-9604 OT Time Calculation (min): 15 min Charges:  OT General Charges $OT Visit: 1 Visit OT Evaluation $OT Eval Moderate Complexity: 1 Mod G-Codes:     Marcy Siren, OT Pager [phone] 03/22/2018   Orlando Penner 03/22/2018, 4:57 PM

## 2018-03-23 LAB — COMPREHENSIVE METABOLIC PANEL
ALBUMIN: 2.5 g/dL — AB (ref 3.5–5.0)
ALT: 139 U/L — ABNORMAL HIGH (ref 17–63)
ANION GAP: 11 (ref 5–15)
AST: 103 U/L — AB (ref 15–41)
Alkaline Phosphatase: 69 U/L (ref 38–126)
BILIRUBIN TOTAL: 0.6 mg/dL (ref 0.3–1.2)
BUN: 7 mg/dL (ref 6–20)
CALCIUM: 8.8 mg/dL — AB (ref 8.9–10.3)
CO2: 18 mmol/L — ABNORMAL LOW (ref 22–32)
CREATININE: 0.69 mg/dL (ref 0.61–1.24)
Chloride: 112 mmol/L — ABNORMAL HIGH (ref 101–111)
GFR calc Af Amer: >60 mL/min (ref >60)
GFR calc non Af Amer: >60 mL/min (ref >60)
Glucose, Bld: 114 mg/dL — ABNORMAL HIGH (ref 65–99)
Potassium: 3.5 mmol/L (ref 3.5–5.1)
Sodium: 141 mmol/L (ref 135–145)
TOTAL PROTEIN: 5.9 g/dL — AB (ref 6.5–8.1)

## 2018-03-23 LAB — GLUCOSE, CAPILLARY
GLUCOSE-CAPILLARY: 100 mg/dL — AB (ref 65–99)
GLUCOSE-CAPILLARY: 78 mg/dL (ref 65–99)
Glucose-Capillary: 117 mg/dL — ABNORMAL HIGH (ref 65–99)
Glucose-Capillary: 106 mg/dL — ABNORMAL HIGH (ref 65–99)
Glucose-Capillary: 109 mg/dL — ABNORMAL HIGH (ref 65–99)
Glucose-Capillary: 106 mg/dL — ABNORMAL HIGH (ref 65–99)

## 2018-03-23 NOTE — Progress Notes (Signed)
[facility]                                                                              Patient Demographics  [name], is a 57 y.o. male, DOB - 04/13/1961, ZOX:[id number]  Admit date - 03/17/2018   Admitting Physician Carin Hock, MD  Outpatient Primary MD for the patient is Fleet Contras, MD  Outpatient specialists:   LOS - 5  days   Medical records reviewed and are as summarized below:    Chief Complaint  Patient presents with  . Drug Overdose       Brief summary   Admitted by CCM to ICU on 24/75 57 year old male with PMH as below, which is significant for MS, Chronic pain, Depression, DVT, and gait abnormality. His family noted him to be minimally responsive on the evening hours of 5/8 and called EMS. Upon their arrival the patient had agonal respirations, but was pulsatile. Responded to intranasal narcan. He remained somnolent upon arrival to ED and was given IV narcan. He responded very well and was started on infusion. Patient reports taking 15 tablets of Percocet 10-325. He claims overdose was accidental and he had no intention of harming himself. Currently denies SI. He claims he also took tylenol and ASA. Smoker. Denies drinking and illegal drug use. PCCM asked to admit for Narcan infusion  Assessment & Plan    Principal Problem:   Overdose by acetaminophen, accidental or unintentional, initial encounter - Patient had arrived to ED with minimal responsiveness and agonal respirations.  He received Narcan IV and responded very well to it, patient had reported taking 15 tablets of Percocet 10/325. -He was initially admitted to ICU, on Narcan infusion.  Currently alert and oriented Tylenol level 138 on admission, currently <10.  ASA negative -LFTs trending down, hence an acetylcysteine was discontinued.  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  No need for IV steroids per my discussion with Dr. Wilford Corner (neurology) on 5/13. -At baseline, states he is wheelchair-bound.   - Placed on baclofen 20 mg 3 times daily, he also receives Ocrevus infusion every 6 months, followed by neurology outpatient Dr. Tinnie Gens. -Continue Keppra, Trileptal, encouraged PT, out of bed  Hypokalemia Resolved    GERD (gastroesophageal reflux disease) -Continue Protonix    Essential hypertension - Currently stable, continue triamterene HCTZ    Right shoulder pain, chronic pain syndrome -Patient reports right shoulder has been hurting for last 2 weeks, fell on it  -Right shoulder x-ray negative for any fracture dislocation  Percocet overdose -Appreciate psychiatry recommendations, started on Zoloft, Atarax, trazodone, Risperdal 0.5 mg twice a day for psychosis -MRI brain and C-spine negative.  Patient is medically clear for discharge to inpatient psych, social work assisting with disposition  Bilateral pneumonia  - PCT 14.1, CXR shows multilobar PNA - placed on azithromax and IV rocephin till 5/17  Migraine headaches Placed back on  Topamax, Imitrex as needed   Code Status: Full DVT Prophylaxis: Xarelto Family Communication: Discussed in detail with the patient, all imaging results, lab results explained to the patient    Disposition Plan: Social work assisting with disposition to psych   Time Spent in minutes   25 minutes  Procedures:  MRI brain C-spine  Consultants:   Patient was admitted by CCM Psychiatry  Antimicrobials:      Medications  Scheduled Meds: . azithromycin  500 mg Oral Q24H  . baclofen  20 mg Oral TID  . gabapentin  600 mg Oral TID  . insulin aspart  1-3 Units Subcutaneous Q4H  . levETIRAcetam  500 mg Oral BID  . Oxcarbazepine  300 mg Oral TID  . pantoprazole  40 mg  Oral Q0600  . risperiDONE  0.5 mg Oral BID  . rivaroxaban  10 mg Oral QPM  . sertraline  50 mg Oral Daily  . topiramate  100 mg Oral QHS  . triamterene-hydrochlorothiazide  1 tablet Oral Daily   Continuous Infusions: . cefTRIAXone (ROCEPHIN)  IV Stopped (03/22/18 1751)   PRN Meds:.hydrALAZINE, hydrOXYzine, ondansetron (ZOFRAN) IV, sodium chloride flush, SUMAtriptan, traZODone   Antibiotics   Anti-infectives (From admission, onward)   Start     Dose/Rate Route Frequency Ordered Stop   03/22/18 1600  azithromycin (ZITHROMAX) tablet 500 mg     500 mg Oral Every 24 hours 03/22/18 1126     03/20/18 1545  azithromycin (ZITHROMAX) 500 mg in sodium chloride 0.9 % 250 mL IVPB  Status:  Discontinued     500 mg 250 mL/hr over 60 Minutes Intravenous Every 24 hours 03/20/18 1531 03/22/18 1124   03/20/18 1545  cefTRIAXone (ROCEPHIN) 1 g in sodium chloride 0.9 % 100 mL IVPB     1 g 200 mL/hr over 30 Minutes Intravenous Every 24 hours 03/20/18 1531          Subjective:   Yordin Rhoda was seen and examined today.  Feeling better today, did not walk much with PT yesterday however wants to work with physical therapy now.  Headache improved.  Weakness improving.  No fevers   patient denies dizziness, chest pain, shortness of breath, abdominal pain, N/V/D/C.  Objective:   Vitals:   03/22/18 2120 03/22/18 2120 03/23/18 0438 03/23/18 0500  BP: 122/76 122/76 101/87   Pulse: (!) 101 (!) 101 92   Resp: 18 18 19    Temp: 98.7 F (37.1 C) 98.7 F (37.1 C) 98.9 F (37.2 C)   TempSrc: Oral Oral Oral   SpO2: 94% 94% 93%   Weight:    76.4 kg (168 lb 6.9 oz)  Height:        Intake/Output Summary (Last 24 hours) at 03/23/2018 1218 Last data filed at 03/23/2018 1143 Gross per 24 hour  Intake 480 ml  Output 2475 ml  Net -1995 ml     Wt Readings from Last 3 Encounters:  03/23/18 76.4 kg (168 lb 6.9 oz)  03/01/18 78.5 kg (173 lb)  12/19/17 83.5 kg (184 lb)     Exam   General: Alert  and oriented x 3, NAD  Eyes:   HEENT:   Cardiovascular: S1 S2 auscultated, Regular rate and rhythm. No pedal edema b/l  Respiratory: Clear to auscultation bilaterally, no wheezing, rales or rhonchi  Gastrointestinal: Soft, nontender, nondistended, + bowel sounds  Ext: no pedal edema bilaterally  Neuro: lower extremity weakness  Musculoskeletal: No digital cyanosis, clubbing  Skin: No rashes  Psych: Normal affect and  demeanor, alert and oriented x3    Data Reviewed:  I have personally reviewed following labs and imaging studies  Micro Results Recent Results (from the past 240 hour(s))  MRSA PCR Screening     Status: None   Collection Time: 03/18/18  2:23 AM  Result Value Ref Range Status   MRSA by PCR NEGATIVE NEGATIVE Final    Comment:        The GeneXpert MRSA Assay (FDA approved for NASAL specimens only), is one component of a comprehensive MRSA colonization surveillance program. It is not intended to diagnose MRSA infection nor to guide or monitor treatment for MRSA infections. Performed at [facility], [address] [place], [place] [address]   Urine Culture     Status: None   Collection Time: 03/20/18 11:33 AM  Result Value Ref Range Status   Specimen Description URINE, CLEAN CATCH  Final   Special Requests NONE  Final   Culture   Final    NO GROWTH Performed at [facility], [address] [place], [place] [address]    Report Status 03/21/2018 FINAL  Final  Culture, blood (routine x 2)     Status: None (Preliminary result)   Collection Time: 03/20/18 12:33 PM  Result Value Ref Range Status   Specimen Description BLOOD BLOOD LEFT HAND  Final   Special Requests   Final    BOTTLES DRAWN AEROBIC ONLY Blood Culture results may not be optimal due to an inadequate volume of blood received in culture bottles   Culture   Final    NO GROWTH 3 DAYS Performed at [facility], [address] [place], [place] [address]    Report Status  PENDING  Incomplete  Culture, blood (routine x 2)     Status: None (Preliminary result)   Collection Time: 03/21/18  3:33 AM  Result Value Ref Range Status   Specimen Description BLOOD PORTA CATH  Final   Special Requests   Final    BOTTLES DRAWN AEROBIC ONLY Blood Culture adequate volume   Culture   Final    NO GROWTH 2 DAYS Performed at [facility], [address] [place], [place] [address]    Report Status PENDING  Incomplete    Radiology Reports Dg Chest 2 View  Result Date: 03/04/2018 CLINICAL DATA:  Chest pain EXAM: CHEST - 2 VIEW COMPARISON:  12/19/2017 FINDINGS: Right Port-A-Cath remains in place, unchanged. Heart and mediastinal contours are within normal limits. No focal opacities or effusions. No acute bony abnormality. IMPRESSION: No active cardiopulmonary disease. Electronically Signed   By: Charlett Nose M.D.   On: 03/04/2018 10:17   Dg Shoulder Right  Result Date: 03/19/2018 CLINICAL DATA:  Pain after a fall.  Multiple sclerosis. EXAM: RIGHT SHOULDER - 2+ VIEW COMPARISON:  None. FINDINGS: There is no evidence of fracture or dislocation. There is no evidence of arthropathy or other focal bone abnormality. Soft tissues are unremarkable. IMPRESSION: Negative. Electronically Signed   By: Elsie Stain M.D.   On: 03/19/2018 23:17   Ct Head Wo Contrast  Result Date: 03/04/2018 CLINICAL DATA:  Chest pain and weakness for a week. Headache and right arm pain. EXAM: CT HEAD WITHOUT CONTRAST TECHNIQUE: Contiguous axial images were obtained from the base of the skull through the vertex without intravenous contrast. COMPARISON:  12/21/2017 FINDINGS: BRAIN: The ventricles and sulci are normal. No intraparenchymal hemorrhage, mass effect nor midline shift. No acute large vascular territory infarcts. Grey-white matter  distinction is maintained. The basal ganglia are unremarkable. No abnormal extra-axial fluid collections. Basal cisterns are not effaced and midline. The brainstem and  cerebellar hemispheres are without acute abnormalities. VASCULAR: No hyperdense vessel or unexpected calcification. SKULL/SOFT TISSUES: No acute calvarial fracture. No significant soft tissue swelling. ORBITS/SINUSES: The included ocular globes and orbital contents are normal.The mastoid air cells are clear. The included paranasal sinuses are well-aerated. OTHER: None. IMPRESSION: No acute abnormality noted.  Stable appearance of the brain. Electronically Signed   By: Tollie Eth M.D.   On: 03/04/2018 19:14   Mr Laqueta Jean ZO Contrast  Result Date: 03/22/2018 CLINICAL DATA:  Multiple sclerosis.  New neurological event. EXAM: MRI HEAD WITHOUT AND WITH CONTRAST TECHNIQUE: Multiplanar, multiecho pulse sequences of the brain and surrounding structures were obtained without and with intravenous contrast. CONTRAST:  <See Chart> MULTIHANCE GADOBENATE DIMEGLUMINE 529 MG/ML IV SOLN COMPARISON:  MRI 12/20/2017 and 08/06/2017. FINDINGS: Brain: Diffusion imaging does not show any acute or subacute infarction or other cause of restricted diffusion. Scattered foci of T2 and FLAIR signal remain evident in the hemispheric white matter, with a nonspecific pattern. No new or progressive lesions. No lesions show contrast enhancement. No cortical abnormality. No mass lesion, hemorrhage, hydrocephalus or extra-axial collection. 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 This is unchanged as far back as 2010. Vascular: Major vessels at the base of the brain show flow. Skull and upper cervical spine: Negative Sinuses/Orbits: Clear/normal Other: None IMPRESSION: Stable appearance of the brain. Few scattered punctate nonspecific T2 and FLAIR bright foci of the hemispheric white matter. No new or progressive lesions. No abnormal contrast enhancement. 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 Electronically Signed   By: Paulina Fusi  M.D.   On: 03/22/2018 16:13   Mr Cervical Spine W Wo Contrast  Result Date: 03/22/2018 CLINICAL DATA:  Multiple sclerosis.  New neurological event. EXAM: MRI CERVICAL SPINE WITHOUT AND WITH CONTRAST TECHNIQUE: Multiplanar and multiecho pulse sequences of the cervical spine, to include the craniocervical junction and cervicothoracic junction, were obtained without and with intravenous contrast. CONTRAST:  20 cc MultiHance COMPARISON:  12/20/2017.  08/06/2017 FINDINGS: Alignment: Normal Vertebrae: Normal Cord: Multiple foci of increased T2 signal are again demonstrated in the cord beginning at C2-3 and extending as low as T1-2. Today's study shows less motion degradation in the lesions are more clearly depicted, but I think they are similar in number and distribution. None show abnormal contrast enhancement. Posterior Fossa, vertebral arteries, paraspinal tissues: See results of brain MRI. Disc levels: Degenerative spondylosis at C3-4, C4-5 and C5-6 with narrowing of the canal but no cord compression. Right foraminal stenosis at C3-4 and left foraminal stenosis at C3-4 and C5-6. IMPRESSION: Today's study is better exam, with less motion degradation. Multiple cervical spine and upper thoracic cord lesions are more clearly depicted, consistent with chronic demyelinating disease. In reviewing multiple prior exams, I do not think that there are new or visibly progressive lesions. None show contrast enhancement. Degenerative spondylosis from C3-4 through C5-6 with foraminal narrowing bilaterally at C3-4 and on the left at C5-6. Electronically Signed   By: Paulina Fusi M.D.   On: 03/22/2018 16:08   Dg Chest Port 1 View  Result Date: 03/20/2018 CLINICAL DATA:  Fever EXAM: PORTABLE CHEST 1 VIEW COMPARISON:  03/04/2018 FINDINGS: Hazy airspace disease in bilateral upper lobes and bilateral lower lobes most severe in the  right lower lobe most concerning for multilobar pneumonia. No significant pleural effusion or  pneumothorax. Stable cardiomediastinal silhouette. Right-sided Port-A-Cath with the tip projecting over the cavoatrial junction. No acute osseous abnormality. IMPRESSION: Findings concerning for multilobar pneumonia. Electronically Signed   By: Elige Ko   On: 03/20/2018 13:22    Lab Data:  CBC: Recent Labs  Lab 03/17/18 1952 03/18/18 8119 03/20/18 0453 03/21/18 0333 03/22/18 0332  WBC 15.3* 12.9* 15.3* 11.5* 10.4  NEUTROABS 13.2*  --   --   --   --   HGB 12.9* 12.9* 10.6* 10.4* 11.1*  HCT 40.9 41.3 32.7* 31.8* 33.9*  MCV 89.7 89.4 84.7 84.4 85.4  PLT 260 222 149* 141* 181   Basic Metabolic Panel: Recent Labs  Lab 03/17/18 1952 03/18/18 0731 03/19/18 0627 03/20/18 0453 03/21/18 0333 03/22/18 0332 03/23/18 0429  NA 144 145 140 140 141 141 141  K 3.3* 3.3* 3.0* 3.1* 3.1* 3.4* 3.5  CL 115* 114* 111 111 115* 111 112*  CO2 21* 22 18* 22 20* 21* 18*  GLUCOSE 220* 116* 105* 110* 89 106* 114*  BUN 6 6 <5* <5* 7 <5* 7  CREATININE 1.00 0.81 0.78 0.74 0.72 0.70 0.69  CALCIUM 8.9 8.8* 8.7* 8.2* 7.9* 8.5* 8.8*  MG 2.0 1.8  --   --   --   --   --   PHOS 4.4 2.0*  --   --   --   --   --    GFR: Estimated Creatinine Clearance: 101.9 mL/min (by C-G formula based on SCr of 0.69 mg/dL). Liver Function Tests: Recent Labs  Lab 03/19/18 1658 03/20/18 0453 03/21/18 0333 03/22/18 0332 03/23/18 0429  AST 112* 71* 32 51* 103*  ALT 162* 153* 110* 108* 139*  ALKPHOS 85 74 73 69 69  BILITOT 0.8 0.8 0.9 0.8 0.6  PROT 5.4* 5.0* 5.0* 6.2* 5.9*  ALBUMIN 2.7* 2.2* 2.2* 2.5* 2.5*   No results for input(s): LIPASE, AMYLASE in the last 168 hours. No results for input(s): AMMONIA in the last 168 hours. Coagulation Profile: Recent Labs  Lab 03/18/18 0625 03/19/18 0426 03/20/18 0453 03/21/18 0333 03/22/18 0332  INR 1.56 2.74 2.79 2.04 1.47   Cardiac Enzymes: No results for input(s): CKTOTAL, CKMB, CKMBINDEX, TROPONINI in the last 168 hours. BNP (last 3 results) No results for  input(s): PROBNP in the last 8760 hours. HbA1C: No results for input(s): HGBA1C in the last 72 hours. CBG: Recent Labs  Lab 03/22/18 1213 03/22/18 2010 03/23/18 0019 03/23/18 0441 03/23/18 0810  GLUCAP 147* 140* 78 100* 117*   Lipid Profile: No results for input(s): CHOL, HDL, LDLCALC, TRIG, CHOLHDL, LDLDIRECT in the last 72 hours. Thyroid Function Tests: No results for input(s): TSH, T4TOTAL, FREET4, T3FREE, THYROIDAB in the last 72 hours. Anemia Panel: No results for input(s): VITAMINB12, FOLATE, FERRITIN, TIBC, IRON, RETICCTPCT in the last 72 hours. Urine analysis:    Component Value Date/Time   COLORURINE YELLOW 03/20/2018 1411   APPEARANCEUR CLEAR 03/20/2018 1411   LABSPEC 1.010 03/20/2018 1411   PHURINE 5.0 03/20/2018 1411   GLUCOSEU NEGATIVE 03/20/2018 1411   HGBUR NEGATIVE 03/20/2018 1411   BILIRUBINUR NEGATIVE 03/20/2018 1411   KETONESUR NEGATIVE 03/20/2018 1411   PROTEINUR NEGATIVE 03/20/2018 1411   UROBILINOGEN 1.0 05/14/2014 2113   NITRITE NEGATIVE 03/20/2018 1411   LEUKOCYTESUR NEGATIVE 03/20/2018 1411     Ripudeep Rai M.D. [facility] 03/23/2018, 12:18 PM  Pager: [phone] Between 7am to 7pm - call Pager - [phone]  After 7pm go to [email or web address] - password [email or web address]  Call night coverage person covering after 7pm

## 2018-03-23 NOTE — Progress Notes (Addendum)
2pm- [facility] does not have beds.   10:25am-Per [facility], patient not currently appropriate for their level of care. Medical advisor recommended patient be placed on [facility] list. CSW will also contact [facility].   Osborne Casco Huckleberry Martinson LCSW [phone]

## 2018-03-23 NOTE — Care Management Important Message (Signed)
Important Message  Patient Details  Name: [name] MRN: [id number] Date of Birth: 1961/10/27   Medicare Important Message Given:  Yes    Taima Rada 03/23/2018, 7:14 AM

## 2018-03-24 DIAGNOSIS — I82499 Acute embolism and thrombosis of other specified deep vein of unspecified lower extremity: Secondary | ICD-10-CM

## 2018-03-24 LAB — GLUCOSE, CAPILLARY
GLUCOSE-CAPILLARY: 105 mg/dL — AB (ref 65–99)
GLUCOSE-CAPILLARY: 129 mg/dL — AB (ref 65–99)
GLUCOSE-CAPILLARY: 131 mg/dL — AB (ref 65–99)
GLUCOSE-CAPILLARY: 139 mg/dL — AB (ref 65–99)
GLUCOSE-CAPILLARY: 139 mg/dL — AB (ref 65–99)
Glucose-Capillary: 108 mg/dL — ABNORMAL HIGH (ref 65–99)
Glucose-Capillary: 115 mg/dL — ABNORMAL HIGH (ref 65–99)

## 2018-03-24 MED ORDER — TOPIRAMATE 25 MG PO TABS
50.0000 mg | ORAL_TABLET | Freq: Every morning | ORAL | Status: DC
  Administered 2018-03-24 – 2018-04-09 (×17): 50 mg via ORAL
  Filled 2018-03-24 (×21): qty 2

## 2018-03-24 MED ORDER — OXYCODONE-ACETAMINOPHEN 5-325 MG PO TABS
2.0000 | ORAL_TABLET | Freq: Once | ORAL | Status: AC
  Administered 2018-03-24: 2 via ORAL
  Filled 2018-03-24: qty 2

## 2018-03-24 NOTE — Progress Notes (Signed)
Patient has completed AD forms and we will notarize as soon as the notary and volunteers are available. Phebe Colla, Chaplain   03/24/18 1200  Clinical Encounter Type  Visited With Patient  Visit Type Initial;Other (Comment) (completed AD forms)  Referral From Physician  Consult/Referral To Chaplain  Spiritual Encounters  Spiritual Needs Other (Comment) (Completed AD forms)

## 2018-03-24 NOTE — Progress Notes (Signed)
[facility]                                                                              Patient Demographics  [name], is a 57 y.o. male, DOB - 08-24-1961, RAX:[id number]  Admit date - 03/17/2018   Admitting Physician Carin Hock, MD  Outpatient Primary MD for the patient is Fleet Contras, MD LOS - 6  days   Chief Complaint  Patient presents with  . Drug Overdose       Brief summary   Admitted by CCM to ICU on 37/21 57 year old male with PMH as below, which is significant for MS, Chronic pain, Depression, DVT, and gait abnormality. His family noted him to be minimally responsive on the evening hours of 5/8 and called EMS. Upon their arrival the patient had agonal respirations, but was pulsatile. Responded to intranasal narcan. He remained somnolent upon arrival to ED and was given IV narcan. He responded very well and was started on infusion. Patient reported taking 15 tablets of Percocet 10-325. He claims overdose was accidental and he had no intention of harming himself.  PCCM asked to admit for Narcan infusion  Assessment & Plan     Overdose by acetaminophen, accidental or unintentional, initial encounter - Patient had arrived to ED with minimal responsiveness and agonal respirations.   - s/p Narcan IV and responded very well to it, patient had reported taking 15 tablets of Percocet 10/325. -He was initially admitted to ICU, on Narcan infusion, mentation improved, currently alert and oriented -Tylenol level 138 on admission, then<10.  ASA negative -LFTs trending down, hence an acetylcysteine was discontinued. 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  Dr.Rai d/w  NEuro Dr.Arora on 5/13, No need for IV steroids -At baseline, states he is wheelchair-bound.   - Placed on baclofen 20 mg 3 times daily, he also receives Ocrevus infusion every 6 months, followed by neurology outpatient Dr. Tinnie Gens. -Continue Keppra, Trileptal, encouraged PT, out of bed  Hypokalemia Resolved    GERD (gastroesophageal reflux disease) -Continue Protonix    Essential hypertension - Currently stable, continue triamterene HCTZ    Right shoulder pain, chronic pain syndrome -Patient reports right shoulder has been hurting for last 2 weeks, fell on it  -Right shoulder x-ray negative for any fracture dislocation  Percocet overdose -Appreciate psychiatry recommendations, started on Zoloft, Atarax, trazodone, Risperdal 0.5 mg twice a day for psychosis -MRI brain and C-spine negative.  Patient is medically clear for discharge to inpatient psych, social work assisting with disposition  Bilateral pneumonia  - PCT 14.1, CXR shows multilobar PNA - placed on azithromax and IV rocephin till 5/17  Migraine headaches Placed back on Topamax, Imitrex as needed   Code Status: Full DVT Prophylaxis: Xarelto Family Communication: Discussed in detail with the patient, all imaging results, lab results explained to the patient    Disposition Plan:  Social work assisting with disposition to psych   Time Spent in minutes    Procedures:  MRI brain C-spine  Consultants:   Patient was admitted by CCM Psychiatry  Antimicrobials:      Medications  Scheduled Meds: . azithromycin  500 mg Oral Q24H  . baclofen  20 mg Oral TID  . gabapentin  600 mg Oral TID  . insulin aspart  1-3 Units Subcutaneous Q4H  . levETIRAcetam  500 mg Oral BID  . Oxcarbazepine  300 mg Oral TID  . pantoprazole  40 mg Oral Q0600  . risperiDONE  0.5 mg Oral BID  . rivaroxaban  10 mg Oral QPM  . sertraline  50 mg Oral Daily  . topiramate  100 mg Oral QHS  . topiramate  50 mg Oral q morning - 10a    . triamterene-hydrochlorothiazide  1 tablet Oral Daily   Continuous Infusions: . cefTRIAXone (ROCEPHIN)  IV Stopped (03/23/18 1543)   PRN Meds:.hydrALAZINE, hydrOXYzine, ondansetron (ZOFRAN) IV, sodium chloride flush, SUMAtriptan, traZODone   Antibiotics   Anti-infectives (From admission, onward)   Start     Dose/Rate Route Frequency Ordered Stop   03/22/18 1600  azithromycin (ZITHROMAX) tablet 500 mg     500 mg Oral Every 24 hours 03/22/18 1126 03/26/18 2359   03/20/18 1545  azithromycin (ZITHROMAX) 500 mg in sodium chloride 0.9 % 250 mL IVPB  Status:  Discontinued     500 mg 250 mL/hr over 60 Minutes Intravenous Every 24 hours 03/20/18 1531 03/22/18 1124   03/20/18 1545  cefTRIAXone (ROCEPHIN) 1 g in sodium chloride 0.9 % 100 mL IVPB     1 g 200 mL/hr over 30 Minutes Intravenous Every 24 hours 03/20/18 1531 03/26/18 2359        Subjective:   [name] was seen and examined today -feels ok, requests higher dose of topamax as per home regimen  Objective:   Vitals:   03/23/18 2152 03/24/18 0428 03/24/18 0447 03/24/18 1354  BP: 128/81 119/81  108/81  Pulse: 84 87  94  Resp: 18 15  18   Temp: 98 F (36.7 C) 98.3 F (36.8 C)  98.2 F (36.8 C)  TempSrc: Oral Oral  Oral  SpO2: 95% 94%  95%  Weight:   74.4 kg (164 lb 0.4 oz)   Height:        Intake/Output Summary (Last 24 hours) at 03/24/2018 1509 Last data filed at 03/24/2018 1315 Gross per 24 hour  Intake 790 ml  Output 1150 ml  Net -360 ml     Wt Readings from Last 3 Encounters:  03/24/18 74.4 kg (164 lb 0.4 oz)  03/01/18 78.5 kg (173 lb)  12/19/17 83.5 kg (184 lb)     Exam  Gen: Awake, Alert, Oriented X 3, no distress, chronically ill-appearing HEENT: PERRLA, Neck supple, no JVD Lungs: Good air movement bilaterally, CTAB CVS: RRR,No Gallops,Rubs or new Murmurs Abd: soft, Non tender, non distended, BS present Extremities: No Cyanosis, Clubbing or edema Skin: no new rashes Neuro: spastic  weakness of both legs lower legs will Psych: Normal affect and demeanor, alert and oriented x3    Data Reviewed:  I have personally reviewed following labs and imaging studies  Micro Results Recent Results (from the past 240 hour(s))  MRSA PCR Screening     Status: None   Collection Time: 03/18/18  2:23 AM  Result Value Ref Range Status   MRSA by PCR NEGATIVE NEGATIVE Final    Comment:  The GeneXpert MRSA Assay (FDA approved for NASAL specimens only), is one component of a comprehensive MRSA colonization surveillance program. It is not intended to diagnose MRSA infection nor to guide or monitor treatment for MRSA infections. Performed at [facility], [address], [place], [place] [address]   Urine Culture     Status: None   Collection Time: 03/20/18 11:33 AM  Result Value Ref Range Status   Specimen Description URINE, CLEAN CATCH  Final   Special Requests NONE  Final   Culture   Final    NO GROWTH Performed at [facility], [address], [place], [place] [address]    Report Status 03/21/2018 FINAL  Final  Culture, blood (routine x 2)     Status: None (Preliminary result)   Collection Time: 03/20/18 12:33 PM  Result Value Ref Range Status   Specimen Description BLOOD BLOOD LEFT HAND  Final   Special Requests   Final    BOTTLES DRAWN AEROBIC ONLY Blood Culture results may not be optimal due to an inadequate volume of blood received in culture bottles   Culture   Final    NO GROWTH 4 DAYS Performed at [facility], [address] [place], [place] [address]    Report Status PENDING  Incomplete  Culture, blood (routine x 2)     Status: None (Preliminary result)   Collection Time: 03/21/18  3:33 AM  Result Value Ref Range Status   Specimen Description BLOOD PORTA CATH  Final   Special Requests   Final    BOTTLES DRAWN AEROBIC ONLY Blood Culture adequate volume   Culture   Final    NO GROWTH 3 DAYS Performed at [facility], [address] [place], [place] [address]    Report Status PENDING  Incomplete    Radiology Reports Dg Chest 2 View  Result Date: 03/04/2018 CLINICAL DATA:  Chest pain EXAM: CHEST - 2 VIEW COMPARISON:  12/19/2017 FINDINGS: Right Port-A-Cath remains in place, unchanged. Heart and mediastinal contours are within normal limits. No focal opacities or effusions. No acute bony abnormality. IMPRESSION: No active cardiopulmonary disease. Electronically Signed   By: Charlett Nose M.D.   On: 03/04/2018 10:17   Dg Shoulder Right  Result Date: 03/19/2018 CLINICAL DATA:  Pain after a fall.  Multiple sclerosis. EXAM: RIGHT SHOULDER - 2+ VIEW COMPARISON:  None. FINDINGS: There is no evidence of fracture or dislocation. There is no evidence of arthropathy or other focal bone abnormality. Soft tissues are unremarkable. IMPRESSION: Negative. Electronically Signed   By: Elsie Stain M.D.   On: 03/19/2018 23:17   Ct Head Wo Contrast  Result Date: 03/04/2018 CLINICAL DATA:  Chest pain and weakness for a week. Headache and right arm pain. EXAM: CT HEAD WITHOUT CONTRAST TECHNIQUE: Contiguous axial images were obtained from the base of the skull through the vertex without intravenous contrast. COMPARISON:  12/21/2017 FINDINGS: BRAIN: The ventricles and sulci are normal. No intraparenchymal hemorrhage, mass effect nor midline shift. No acute large vascular territory infarcts. Grey-white matter distinction is maintained. The basal ganglia are unremarkable. No abnormal extra-axial fluid collections. Basal cisterns are not effaced and midline. The brainstem and cerebellar hemispheres are without acute abnormalities. VASCULAR: No hyperdense vessel or unexpected calcification. SKULL/SOFT TISSUES: No acute calvarial fracture. No significant soft tissue swelling. ORBITS/SINUSES: The included ocular globes and orbital contents are normal.The mastoid air cells are clear. The included paranasal sinuses are well-aerated. OTHER: None.  IMPRESSION: No  acute abnormality noted.  Stable appearance of the brain. Electronically Signed   By: Tollie Eth M.D.   On: 03/04/2018 19:14   Mr Laqueta Jean ZO Contrast  Result Date: 03/22/2018 CLINICAL DATA:  Multiple sclerosis.  New neurological event. EXAM: MRI HEAD WITHOUT AND WITH CONTRAST TECHNIQUE: Multiplanar, multiecho pulse sequences of the brain and surrounding structures were obtained without and with intravenous contrast. CONTRAST:  <See Chart> MULTIHANCE GADOBENATE DIMEGLUMINE 529 MG/ML IV SOLN COMPARISON:  MRI 12/20/2017 and 08/06/2017. FINDINGS: Brain: Diffusion imaging does not show any acute or subacute infarction or other cause of restricted diffusion. Scattered foci of T2 and FLAIR signal remain evident in the hemispheric white matter, with a nonspecific pattern. No new or progressive lesions. No lesions show contrast enhancement. No cortical abnormality. No mass lesion, hemorrhage, hydrocephalus or extra-axial collection. 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 This is unchanged as far back as 2010. Vascular: Major vessels at the base of the brain show flow. Skull and upper cervical spine: Negative Sinuses/Orbits: Clear/normal Other: None IMPRESSION: Stable appearance of the brain. Few scattered punctate nonspecific T2 and FLAIR bright foci of the hemispheric white matter. No new or progressive lesions. No abnormal contrast enhancement. 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 Electronically Signed   By: Paulina Fusi M.D.   On: 03/22/2018 16:13   Mr Cervical Spine W Wo Contrast  Result Date: 03/22/2018 CLINICAL DATA:  Multiple sclerosis.  New neurological event. EXAM: MRI CERVICAL SPINE WITHOUT AND WITH CONTRAST TECHNIQUE: Multiplanar and multiecho pulse sequences of the cervical spine, to include the craniocervical junction and cervicothoracic junction, were obtained without and  with intravenous contrast. CONTRAST:  20 cc MultiHance COMPARISON:  12/20/2017.  08/06/2017 FINDINGS: Alignment: Normal Vertebrae: Normal Cord: Multiple foci of increased T2 signal are again demonstrated in the cord beginning at C2-3 and extending as low as T1-2. Today's study shows less motion degradation in the lesions are more clearly depicted, but I think they are similar in number and distribution. None show abnormal contrast enhancement. Posterior Fossa, vertebral arteries, paraspinal tissues: See results of brain MRI. Disc levels: Degenerative spondylosis at C3-4, C4-5 and C5-6 with narrowing of the canal but no cord compression. Right foraminal stenosis at C3-4 and left foraminal stenosis at C3-4 and C5-6. IMPRESSION: Today's study is better exam, with less motion degradation. Multiple cervical spine and upper thoracic cord lesions are more clearly depicted, consistent with chronic demyelinating disease. In reviewing multiple prior exams, I do not think that there are new or visibly progressive lesions. None show contrast enhancement. Degenerative spondylosis from C3-4 through C5-6 with foraminal narrowing bilaterally at C3-4 and on the left at C5-6. Electronically Signed   By: Paulina Fusi M.D.   On: 03/22/2018 16:08   Dg Chest Port 1 View  Result Date: 03/20/2018 CLINICAL DATA:  Fever EXAM: PORTABLE CHEST 1 VIEW COMPARISON:  03/04/2018 FINDINGS: Hazy airspace disease in bilateral upper lobes and bilateral lower lobes most severe in the right lower lobe most concerning for multilobar pneumonia. No significant pleural effusion or pneumothorax. Stable cardiomediastinal silhouette. Right-sided Port-A-Cath with the tip projecting over the cavoatrial junction. No acute osseous abnormality. IMPRESSION: Findings concerning for multilobar pneumonia. Electronically Signed   By: Elige Ko   On: 03/20/2018 13:22    Lab Data:  CBC: Recent Labs  Lab 03/17/18 1952 03/18/18 1096 03/20/18 0454  03/21/18 0981 03/22/18 1914  WBC 15.3* 12.9* 15.3* 11.5* 10.4  NEUTROABS 13.2*  --   --   --   --   HGB 12.9* 12.9* 10.6* 10.4* 11.1*  HCT 40.9 41.3 32.7* 31.8* 33.9*  MCV 89.7 89.4 84.7 84.4 85.4  PLT 260 222 149* 141* 181   Basic Metabolic Panel: Recent Labs  Lab 03/17/18 1952 03/18/18 0731 03/19/18 0627 03/20/18 0453 03/21/18 0333 03/22/18 0332 03/23/18 0429  NA 144 145 140 140 141 141 141  K 3.3* 3.3* 3.0* 3.1* 3.1* 3.4* 3.5  CL 115* 114* 111 111 115* 111 112*  CO2 21* 22 18* 22 20* 21* 18*  GLUCOSE 220* 116* 105* 110* 89 106* 114*  BUN 6 6 <5* <5* 7 <5* 7  CREATININE 1.00 0.81 0.78 0.74 0.72 0.70 0.69  CALCIUM 8.9 8.8* 8.7* 8.2* 7.9* 8.5* 8.8*  MG 2.0 1.8  --   --   --   --   --   PHOS 4.4 2.0*  --   --   --   --   --    GFR: Estimated Creatinine Clearance: 101.9 mL/min (by C-G formula based on SCr of 0.69 mg/dL). Liver Function Tests: Recent Labs  Lab 03/19/18 1658 03/20/18 0453 03/21/18 0333 03/22/18 0332 03/23/18 0429  AST 112* 71* 32 51* 103*  ALT 162* 153* 110* 108* 139*  ALKPHOS 85 74 73 69 69  BILITOT 0.8 0.8 0.9 0.8 0.6  PROT 5.4* 5.0* 5.0* 6.2* 5.9*  ALBUMIN 2.7* 2.2* 2.2* 2.5* 2.5*   No results for input(s): LIPASE, AMYLASE in the last 168 hours. No results for input(s): AMMONIA in the last 168 hours. Coagulation Profile: Recent Labs  Lab 03/18/18 0625 03/19/18 0426 03/20/18 0453 03/21/18 0333 03/22/18 0332  INR 1.56 2.74 2.79 2.04 1.47   Cardiac Enzymes: No results for input(s): CKTOTAL, CKMB, CKMBINDEX, TROPONINI in the last 168 hours. BNP (last 3 results) No results for input(s): PROBNP in the last 8760 hours. HbA1C: No results for input(s): HGBA1C in the last 72 hours. CBG: Recent Labs  Lab 03/23/18 2003 03/24/18 0015 03/24/18 0423 03/24/18 0742 03/24/18 1200  GLUCAP 106* 139* 105* 115* 139*   Lipid Profile: No results for input(s): CHOL, HDL, LDLCALC, TRIG, CHOLHDL, LDLDIRECT in the last 72 hours. Thyroid Function  Tests: No results for input(s): TSH, T4TOTAL, FREET4, T3FREE, THYROIDAB in the last 72 hours. Anemia Panel: No results for input(s): VITAMINB12, FOLATE, FERRITIN, TIBC, IRON, RETICCTPCT in the last 72 hours. Urine analysis:    Component Value Date/Time   COLORURINE YELLOW 03/20/2018 1411   APPEARANCEUR CLEAR 03/20/2018 1411   LABSPEC 1.010 03/20/2018 1411   PHURINE 5.0 03/20/2018 1411   GLUCOSEU NEGATIVE 03/20/2018 1411   HGBUR NEGATIVE 03/20/2018 1411   BILIRUBINUR NEGATIVE 03/20/2018 1411   KETONESUR NEGATIVE 03/20/2018 1411   PROTEINUR NEGATIVE 03/20/2018 1411   UROBILINOGEN 1.0 05/14/2014 2113   NITRITE NEGATIVE 03/20/2018 1411   LEUKOCYTESUR NEGATIVE 03/20/2018 1411     Zannie Cove M.D. [facility] 03/24/2018, 3:09 PM  Page via [email or web address] - password [email or web address]  Call night coverage person after 7pm

## 2018-03-24 NOTE — Progress Notes (Signed)
Finished up the AD with notary and 2 witnesses.  One copy to unit sec for chart and original and 2 copies to patient. Phebe Colla, Chaplain   03/24/18 1400  Clinical Encounter Type  Visited With Patient  Visit Type Follow-up;Other (Comment) (finished AD original and 2 copies to patient 1Unit Sec)  Referral From Physician  Consult/Referral To Chaplain  Advance Directives (For Healthcare)  Does Patient Have a Medical Advance Directive? Yes  Type of Estate agent of [place];Living will

## 2018-03-24 NOTE — Progress Notes (Signed)
Physical Therapy Treatment Patient Details Name: [name] MRN: [id number] DOB: June 09, 1961 Today's Date: 03/24/2018    History of Present Illness 57 year old male with PMH as below, which is significant for MS, Chronic pain, Depression, DVT, and gait abnormality.Pt admitted for minimally responsiveness due pt taking 15 tabs of Perfocet 10-325. Denies SI.    PT Comments    Patient able to maneuver down hallway in w/c to see sunlight in NT hallway, but not safe with pivot transfer to w/c.   Feel appropriate for d/c to acute psychiatric facility when able.  PT to follow acutely.   Follow Up Recommendations  Other (comment)(looking for inpatient psychiatric facility)     Equipment Recommendations  None recommended by PT    Recommendations for Other Services       Precautions / Restrictions Precautions Precautions: Fall Precaution Comments: sitter due to possible suicide attempt    Mobility  Bed Mobility Overal bed mobility: Needs Assistance Bed Mobility: Supine to Sit     Supine to sit: Min assist     General bed mobility comments: to scoot to EOB  Transfers Overall transfer level: Needs assistance Equipment used: Rolling walker (2 wheeled);None Transfers: Sit to/from [employer] to Stand: Min assist Stand pivot transfers: Min assist       General transfer comment: pivots to chair on R, but turns around facing chair then lands R hip in chiar, sit to stand to walker with min A up out of recliner  Ambulation/Gait Ambulation/Gait assistance: Min assist Ambulation Distance (Feet): 4 Feet Assistive device: Rolling walker (2 wheeled) Gait Pattern/deviations: Step-to pattern;Shuffle;Decreased dorsiflexion - right;Wide base of support     General Gait Details: anterior bias weight on hands and dragging R foot behind, turned with walker safer than withoug to get to recliner from w/c   Education officer, community mobility: Yes Wheelchair propulsion: Both upper extremities Wheelchair parts: Needs assistance Distance: 350 Wheelchair Assistance Details (indicate cue type and reason): down to hallway to NT to soak in sunlight, pt began wheeling, back to room then requested PT assist, patient had power w/c at home  Modified Rankin (Stroke Patients Only)       Balance Overall balance assessment: Needs assistance Sitting-balance support: Feet supported Sitting balance-Leahy Scale: Fair Sitting balance - Comments: reports bed too high for him to bend down to don socks per pt   Standing balance support: Bilateral upper extremity supported Standing balance-Leahy Scale: Poor Standing balance comment: needs external support at this time                            Cognition Arousal/Alertness: Awake/alert Behavior During Therapy: WFL for tasks assessed/performed Overall Cognitive Status: No family/caregiver present to determine baseline cognitive functioning Area of Impairment: Following commands;Safety/judgement                   Current Attention Level: Selective     Safety/Judgement: Decreased awareness of safety;Decreased awareness of deficits     General Comments: pivots the long way around to get to w/c      Exercises      General Comments General comments (skin integrity, edema, etc.): MD in during tx to assess      Pertinent Vitals/Pain Pain Assessment: No/denies pain    Home Living  Prior Function            PT Goals (current goals can now be found in the care plan section) Progress towards PT goals: Progressing toward goals    Frequency    Min 3X/week      PT Plan Current plan remains appropriate    Co-evaluation              AM-PAC PT "6 Clicks" Daily Activity  Outcome Measure  Difficulty turning over in bed (including adjusting bedclothes, sheets and blankets)?: A Little Difficulty  moving from lying on back to sitting on the side of the bed? : A Little Difficulty sitting down on and standing up from a chair with arms (e.g., wheelchair, bedside commode, etc,.)?: Unable Help needed moving to and from a bed to chair (including a wheelchair)?: A Little Help needed walking in hospital room?: A Lot Help needed climbing 3-5 steps with a railing? : Total 6 Click Score: 13    End of Session Equipment Utilized During Treatment: Gait belt Activity Tolerance: Patient tolerated treatment well Patient left: with call bell/phone within reach;in chair;with nursing/sitter in room   PT Visit Diagnosis: Unsteadiness on feet (R26.81)     Time: 1610-9604(54 m in to get w/c) PT Time Calculation (min) (ACUTE ONLY): 57 min  Charges:  $Therapeutic Activity: 8-22 mins $Wheel Chair Management: 23-37 mins                    G CodesSheran Lawless, Sebastian [phone] 03/24/2018    Elray Mcgregor 03/24/2018, 12:34 PM

## 2018-03-25 ENCOUNTER — Ambulatory Visit: Payer: 59 | Admitting: Speech Pathology

## 2018-03-25 DIAGNOSIS — L818 Other specified disorders of pigmentation: Secondary | ICD-10-CM

## 2018-03-25 LAB — GLUCOSE, CAPILLARY
GLUCOSE-CAPILLARY: 123 mg/dL — AB (ref 65–99)
GLUCOSE-CAPILLARY: 136 mg/dL — AB (ref 65–99)
Glucose-Capillary: 111 mg/dL — ABNORMAL HIGH (ref 65–99)
Glucose-Capillary: 112 mg/dL — ABNORMAL HIGH (ref 65–99)
Glucose-Capillary: 112 mg/dL — ABNORMAL HIGH (ref 65–99)
Glucose-Capillary: 113 mg/dL — ABNORMAL HIGH (ref 65–99)

## 2018-03-25 LAB — CULTURE, BLOOD (ROUTINE X 2): Culture: NO GROWTH

## 2018-03-25 MED ORDER — OXYCODONE-ACETAMINOPHEN 5-325 MG PO TABS
1.0000 | ORAL_TABLET | Freq: Three times a day (TID) | ORAL | Status: DC | PRN
  Administered 2018-03-25 – 2018-03-26 (×2): 1 via ORAL
  Filled 2018-03-25 (×2): qty 1

## 2018-03-25 MED ORDER — OXYCODONE-ACETAMINOPHEN 5-325 MG PO TABS
2.0000 | ORAL_TABLET | Freq: Once | ORAL | Status: AC
  Administered 2018-03-25: 2 via ORAL
  Filled 2018-03-25: qty 2

## 2018-03-25 MED ORDER — ALTEPLASE 2 MG IJ SOLR
2.0000 mg | Freq: Once | INTRAMUSCULAR | Status: AC
  Administered 2018-03-25: 2 mg
  Filled 2018-03-25: qty 2

## 2018-03-25 NOTE — Progress Notes (Signed)
[facility]                                                                              Patient Demographics  [name], is a 57 y.o. male, DOB - [birth year], KGM:[id number]  Admit date - 03/17/2018   Admitting Physician Carin Hock, MD  Outpatient Primary MD for the patient is Fleet Contras, MD LOS - 7  days   Chief Complaint  Patient presents with  . Drug Overdose       Brief summary   Admitted by CCM to ICU on 26/81 57 year old male with PMH as below, which is significant for MS, Chronic pain, Depression, DVT, and gait abnormality. His family noted him to be minimally responsive on the evening hours of 5/8 and called EMS. Upon their arrival the patient had agonal respirations, but was pulsatile. Responded to intranasal narcan. He remained somnolent upon arrival to ED and was given IV narcan. He responded very well and was started on infusion. Patient reported taking 15 tablets of Percocet 10-325. He claims overdose was accidental and he had no intention of harming himself.  PCCM asked to admit for Narcan infusion  Assessment & Plan     Overdose by acetaminophen, accidental or unintentional, initial encounter - Patient had arrived to ED with minimal responsiveness and agonal respirations.   -patient had reported taking 15 tablets of Percocet, denies SI -He was initially admitted to ICU, on Narcan infusion, mentation improved, currently alert and oriented -Tylenol level 138 on admission, then<10.  ASA negative -LFTs trending down, hence an acetylcysteine was discontinued. 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  Dr.Rai d/w NEuro Dr.Arora on 5/13, No need for IV steroids -At baseline, states he is wheelchair-bound.   - Placed on baclofen 20 mg 3 times daily, he also receives Ocrevus infusion every 6 months, followed by neurology outpatient Dr. Tinnie Gens. -Continue Keppra, Trileptal, encouraged PT, out of bed  Hypokalemia Resolved    GERD (gastroesophageal reflux disease) -Continue Protonix    Essential hypertension - Currently stable, continue triamterene HCTZ    Right shoulder pain, chronic pain syndrome -Patient reports right shoulder has been hurting for last 2 weeks, fell on it  -Right shoulder x-ray negative for any fracture dislocation  Bilateral pneumonia  - PCT 14.1, CXR shows multilobar PNA - placed on azithromax and IV rocephin till 5/17  Migraine headaches Placed back on Topamax, Imitrex as needed   Code Status: Full DVT Prophylaxis: Xarelto Family Communication: Discussed in detail with the patient, all imaging results, lab results explained to the patient  Disposition Plan: Social work following, disposition continues to remain a challenge due to difficulty finding inpatient psych facility   Time Spent  in minutes    Procedures:  MRI brain C-spine  Consultants:   Patient was admitted by CCM Psychiatry  Antimicrobials:      Medications  Scheduled Meds: . azithromycin  500 mg Oral Q24H  . baclofen  20 mg Oral TID  . gabapentin  600 mg Oral TID  . insulin aspart  1-3 Units Subcutaneous Q4H  . levETIRAcetam  500 mg Oral BID  . Oxcarbazepine  300 mg Oral TID  . pantoprazole  40 mg Oral Q0600  . risperiDONE  0.5 mg Oral BID  . rivaroxaban  10 mg Oral QPM  . sertraline  50 mg Oral Daily  . topiramate  100 mg Oral QHS  . topiramate  50 mg Oral q morning - 10a  .  triamterene-hydrochlorothiazide  1 tablet Oral Daily   Continuous Infusions: . cefTRIAXone (ROCEPHIN)  IV Stopped (03/24/18 1652)   PRN Meds:.hydrALAZINE, hydrOXYzine, ondansetron (ZOFRAN) IV, sodium chloride flush, SUMAtriptan, traZODone   Antibiotics   Anti-infectives (From admission, onward)   Start     Dose/Rate Route Frequency Ordered Stop   03/22/18 1600  azithromycin (ZITHROMAX) tablet 500 mg     500 mg Oral Every 24 hours 03/22/18 1126 03/26/18 2359   03/20/18 1545  azithromycin (ZITHROMAX) 500 mg in sodium chloride 0.9 % 250 mL IVPB  Status:  Discontinued     500 mg 250 mL/hr over 60 Minutes Intravenous Every 24 hours 03/20/18 1531 03/22/18 1124   03/20/18 1545  cefTRIAXone (ROCEPHIN) 1 g in sodium chloride 0.9 % 100 mL IVPB     1 g 200 mL/hr over 30 Minutes Intravenous Every 24 hours 03/20/18 1531 03/26/18 2359        Subjective:   [name] was seen and examined today -no new complaints, feels well back to baseline, continues to deny suicidal ideations  Objective:   Vitals:   03/24/18 1354 03/24/18 2145 03/25/18 0111 03/25/18 0441  BP: 108/81 114/70  105/72  Pulse: 94 93  73  Resp: 18 16  16   Temp: 98.2 F (36.8 C) 98.9 F (37.2 C)  98.1 F (36.7 C)  TempSrc: Oral Oral  Oral  SpO2: 95% 93%  95%  Weight:   75.1 kg (165 lb 9.1 oz)   Height:        Intake/Output Summary (Last 24 hours) at 03/25/2018 1233 Last data filed at 03/25/2018 1042 Gross per 24 hour  Intake 1642 ml  Output 1500 ml  Net 142 ml     Wt Readings from Last 3 Encounters:  03/25/18 75.1 kg (165 lb 9.1 oz)  03/01/18 78.5 kg (173 lb)  12/19/17 83.5 kg (184 lb)     Exam  Gen: chronically ill-appearing male, Awake, Alert, Oriented X 3,  HEENT: PERRLA, Neck supple, no JVD Lungs: Good air movement bilaterally, CTAB CVS: RRR,No Gallops,Rubs or new Murmurs Abd: soft, Non tender, non distended, BS present Extremities: No Cyanosis, Clubbing or edema Skin: no new rashes Neuro:  spastic weakness of both legs lower legs will Psych: Normal affect and demeanor, alert and oriented x3    Data Reviewed:  I have personally reviewed following labs and imaging studies  Micro Results Recent Results (from the past 240 hour(s))  MRSA PCR Screening     Status: None   Collection Time: 03/18/18  2:23 AM  Result Value Ref Range Status   MRSA by PCR NEGATIVE NEGATIVE Final    Comment:        The GeneXpert MRSA Assay (FDA  approved for NASAL specimens only), is one component of a comprehensive MRSA colonization surveillance program. It is not intended to diagnose MRSA infection nor to guide or monitor treatment for MRSA infections. Performed at [facility], [address] [place], [place] [address]   Urine Culture     Status: None   Collection Time: 03/20/18 11:33 AM  Result Value Ref Range Status   Specimen Description URINE, CLEAN CATCH  Final   Special Requests NONE  Final   Culture   Final    NO GROWTH Performed at [facility], [address] [place], [place] [address]    Report Status 03/21/2018 FINAL  Final  Culture, blood (routine x 2)     Status: None   Collection Time: 03/20/18 12:33 PM  Result Value Ref Range Status   Specimen Description BLOOD BLOOD LEFT HAND  Final   Special Requests   Final    BOTTLES DRAWN AEROBIC ONLY Blood Culture results may not be optimal due to an inadequate volume of blood received in culture bottles   Culture   Final    NO GROWTH 5 DAYS Performed at [facility], [address], [place], [place] [address]    Report Status 03/25/2018 FINAL  Final  Culture, blood (routine x 2)     Status: None (Preliminary result)   Collection Time: 03/21/18  3:33 AM  Result Value Ref Range Status   Specimen Description BLOOD PORTA CATH  Final   Special Requests   Final    BOTTLES DRAWN AEROBIC ONLY Blood Culture adequate volume   Culture   Final    NO GROWTH 4 DAYS Performed at [facility], [address], [place], [place] [address]    Report Status PENDING  Incomplete    Radiology Reports Dg Chest 2 View  Result Date: 03/04/2018 CLINICAL DATA:  Chest pain EXAM: CHEST - 2 VIEW COMPARISON:  12/19/2017 FINDINGS: Right Port-A-Cath remains in place, unchanged. Heart and mediastinal contours are within normal limits. No focal opacities or effusions. No acute bony abnormality. IMPRESSION: No active cardiopulmonary disease. Electronically Signed   By: Charlett Nose M.D.   On: 03/04/2018 10:17   Dg Shoulder Right  Result Date: 03/19/2018 CLINICAL DATA:  Pain after a fall.  Multiple sclerosis. EXAM: RIGHT SHOULDER - 2+ VIEW COMPARISON:  None. FINDINGS: There is no evidence of fracture or dislocation. There is no evidence of arthropathy or other focal bone abnormality. Soft tissues are unremarkable. IMPRESSION: Negative. Electronically Signed   By: Elsie Stain M.D.   On: 03/19/2018 23:17   Ct Head Wo Contrast  Result Date: 03/04/2018 CLINICAL DATA:  Chest pain and weakness for a week. Headache and right arm pain. EXAM: CT HEAD WITHOUT CONTRAST TECHNIQUE: Contiguous axial images were obtained from the base of the skull through the vertex without intravenous contrast. COMPARISON:  12/21/2017 FINDINGS: BRAIN: The ventricles and sulci are normal. No intraparenchymal hemorrhage, mass effect nor midline shift. No acute large vascular territory infarcts. Grey-white matter distinction is maintained. The basal ganglia are unremarkable. No abnormal extra-axial fluid collections. Basal cisterns are not effaced and midline. The brainstem and cerebellar hemispheres are without acute abnormalities. VASCULAR: No hyperdense vessel or unexpected calcification. SKULL/SOFT TISSUES: No acute calvarial fracture. No significant soft tissue swelling. ORBITS/SINUSES: The included ocular globes and orbital contents are normal.The mastoid air cells are clear. The included paranasal sinuses are well-aerated. OTHER: None. IMPRESSION: No  acute abnormality noted.  Stable appearance  of the brain. Electronically Signed   By: Tollie Eth M.D.   On: 03/04/2018 19:14   Mr Laqueta Jean JY Contrast  Result Date: 03/22/2018 CLINICAL DATA:  Multiple sclerosis.  New neurological event. EXAM: MRI HEAD WITHOUT AND WITH CONTRAST TECHNIQUE: Multiplanar, multiecho pulse sequences of the brain and surrounding structures were obtained without and with intravenous contrast. CONTRAST:  <See Chart> MULTIHANCE GADOBENATE DIMEGLUMINE 529 MG/ML IV SOLN COMPARISON:  MRI 12/20/2017 and 08/06/2017. FINDINGS: Brain: Diffusion imaging does not show any acute or subacute infarction or other cause of restricted diffusion. Scattered foci of T2 and FLAIR signal remain evident in the hemispheric white matter, with a nonspecific pattern. No new or progressive lesions. No lesions show contrast enhancement. No cortical abnormality. No mass lesion, hemorrhage, hydrocephalus or extra-axial collection. 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 This is unchanged as far back as 2010. Vascular: Major vessels at the base of the brain show flow. Skull and upper cervical spine: Negative Sinuses/Orbits: Clear/normal Other: None IMPRESSION: Stable appearance of the brain. Few scattered punctate nonspecific T2 and FLAIR bright foci of the hemispheric white matter. No new or progressive lesions. No abnormal contrast enhancement. 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 Electronically Signed   By: Paulina Fusi M.D.   On: 03/22/2018 16:13   Mr Cervical Spine W Wo Contrast  Result Date: 03/22/2018 CLINICAL DATA:  Multiple sclerosis.  New neurological event. EXAM: MRI CERVICAL SPINE WITHOUT AND WITH CONTRAST TECHNIQUE: Multiplanar and multiecho pulse sequences of the cervical spine, to include the craniocervical junction and cervicothoracic junction, were obtained without and with  intravenous contrast. CONTRAST:  20 cc MultiHance COMPARISON:  12/20/2017.  08/06/2017 FINDINGS: Alignment: Normal Vertebrae: Normal Cord: Multiple foci of increased T2 signal are again demonstrated in the cord beginning at C2-3 and extending as low as T1-2. Today's study shows less motion degradation in the lesions are more clearly depicted, but I think they are similar in number and distribution. None show abnormal contrast enhancement. Posterior Fossa, vertebral arteries, paraspinal tissues: See results of brain MRI. Disc levels: Degenerative spondylosis at C3-4, C4-5 and C5-6 with narrowing of the canal but no cord compression. Right foraminal stenosis at C3-4 and left foraminal stenosis at C3-4 and C5-6. IMPRESSION: Today's study is better exam, with less motion degradation. Multiple cervical spine and upper thoracic cord lesions are more clearly depicted, consistent with chronic demyelinating disease. In reviewing multiple prior exams, I do not think that there are new or visibly progressive lesions. None show contrast enhancement. Degenerative spondylosis from C3-4 through C5-6 with foraminal narrowing bilaterally at C3-4 and on the left at C5-6. Electronically Signed   By: Paulina Fusi M.D.   On: 03/22/2018 16:08   Dg Chest Port 1 View  Result Date: 03/20/2018 CLINICAL DATA:  Fever EXAM: PORTABLE CHEST 1 VIEW COMPARISON:  03/04/2018 FINDINGS: Hazy airspace disease in bilateral upper lobes and bilateral lower lobes most severe in the right lower lobe most concerning for multilobar pneumonia. No significant pleural effusion or pneumothorax. Stable cardiomediastinal silhouette. Right-sided Port-A-Cath with the tip projecting over the cavoatrial junction. No acute osseous abnormality. IMPRESSION: Findings concerning for multilobar pneumonia. Electronically Signed   By: Elige Ko   On: 03/20/2018 13:22    Lab Data:  CBC: Recent Labs  Lab 03/20/18 0453 03/21/18 0333 03/22/18 0332  WBC 15.3* 11.5*  10.4  HGB 10.6* 10.4* 11.1*  HCT 32.7* 31.8* 33.9*  MCV 84.7 84.4 85.4  PLT 149* 141* 181   Basic Metabolic Panel: Recent Labs  Lab 03/19/18 0627 03/20/18 0453 03/21/18 0333 03/22/18 0332 03/23/18 0429  NA 140 140 141 141 141  K 3.0* 3.1* 3.1* 3.4* 3.5  CL 111 111 115* 111 112*  CO2 18* 22 20* 21* 18*  GLUCOSE 105* 110* 89 106* 114*  BUN <5* <5* 7 <5* 7  CREATININE 0.78 0.74 0.72 0.70 0.69  CALCIUM 8.7* 8.2* 7.9* 8.5* 8.8*   GFR: Estimated Creatinine Clearance: 101.9 mL/min (by C-G formula based on SCr of 0.69 mg/dL). Liver Function Tests: Recent Labs  Lab 03/19/18 1658 03/20/18 0453 03/21/18 0333 03/22/18 0332 03/23/18 0429  AST 112* 71* 32 51* 103*  ALT 162* 153* 110* 108* 139*  ALKPHOS 85 74 73 69 69  BILITOT 0.8 0.8 0.9 0.8 0.6  PROT 5.4* 5.0* 5.0* 6.2* 5.9*  ALBUMIN 2.7* 2.2* 2.2* 2.5* 2.5*   No results for input(s): LIPASE, AMYLASE in the last 168 hours. No results for input(s): AMMONIA in the last 168 hours. Coagulation Profile: Recent Labs  Lab 03/19/18 0426 03/20/18 0453 03/21/18 0333 03/22/18 0332  INR 2.74 2.79 2.04 1.47   Cardiac Enzymes: No results for input(s): CKTOTAL, CKMB, CKMBINDEX, TROPONINI in the last 168 hours. BNP (last 3 results) No results for input(s): PROBNP in the last 8760 hours. HbA1C: No results for input(s): HGBA1C in the last 72 hours. CBG: Recent Labs  Lab 03/24/18 2149 03/24/18 2331 03/25/18 0439 03/25/18 0752 03/25/18 1110  GLUCAP 129* 108* 123* 111* 136*   Lipid Profile: No results for input(s): CHOL, HDL, LDLCALC, TRIG, CHOLHDL, LDLDIRECT in the last 72 hours. Thyroid Function Tests: No results for input(s): TSH, T4TOTAL, FREET4, T3FREE, THYROIDAB in the last 72 hours. Anemia Panel: No results for input(s): VITAMINB12, FOLATE, FERRITIN, TIBC, IRON, RETICCTPCT in the last 72 hours. Urine analysis:    Component Value Date/Time   COLORURINE YELLOW 03/20/2018 1411   APPEARANCEUR CLEAR 03/20/2018 1411    LABSPEC 1.010 03/20/2018 1411   PHURINE 5.0 03/20/2018 1411   GLUCOSEU NEGATIVE 03/20/2018 1411   HGBUR NEGATIVE 03/20/2018 1411   BILIRUBINUR NEGATIVE 03/20/2018 1411   KETONESUR NEGATIVE 03/20/2018 1411   PROTEINUR NEGATIVE 03/20/2018 1411   UROBILINOGEN 1.0 05/14/2014 2113   NITRITE NEGATIVE 03/20/2018 1411   LEUKOCYTESUR NEGATIVE 03/20/2018 1411     Zannie Cove M.D. [facility] 03/25/2018, 12:33 PM  Page via [email or web address] - password [email or web address]  Call night coverage person after 7pm

## 2018-03-25 NOTE — Progress Notes (Signed)
Pt upset, reports that he is not getting adequate pain relief with 1 percocet every 8 hours. Reports chronic pain in legs "7/10". Emotional support provided, hospitalist notified. Will monitor.

## 2018-03-25 NOTE — Consult Note (Signed)
[facility] Psych Consult Progress Note  03/25/2018 11:59 AM [name]  MRN:  [id number] Subjective:   [name] was admitted with Percocet overdose. He ingested 30-40 Percocet 10-325 mg tablets. He required Narcan en route and Tylenol level was 136 on admission. He was recommended for inpatient psychiatric hospitalization.   On interview, [name] reports that his mood is "good."  He denies problems with appetite and sleep.  He continues to report inconsistencies about how he ingested Percocet.  He reports that 7 tablets fell into a cup of water and nowhere else such as the floor.  He previously reported taking 30-40 tablets. He fell asleep and then drank the cup of water because he forgot that the medication fell in there.  He denies SI, HI or AVH.  He denies problems with his medications.    Principal Problem: Suicide attempt [facility]) Diagnosis:   Patient Active Problem List   Diagnosis Date Noted  . Suicide attempt (HCC) [T14.91XA]   . Overdose by acetaminophen, accidental or unintentional, initial encounter [T39.1X1A] 03/18/2018  . Intentional acetaminophen overdose (HCC) [T39.1X2A]   . 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 Weakness [R53.1]   . Cellulitis of right leg [L03.115] 05/07/2017  . Right carpal tunnel syndrome [G56.01] 02/16/2017  . Gait disturbance [R26.9] 01/22/2017  . Insomnia [G47.00] 01/22/2017  . Depression with anxiety [F41.8] 01/22/2017  . High risk medication use [Z79.899] 01/22/2017  . Major depressive disorder, recurrent episode (HCC) [F33.9] 12/19/2016  . Suicidal ideation [R45.851]   . AKI (acute kidney injury) (HCC) [N17.9] 11/15/2016  . Chronic hepatitis C virus infection (HCC) [B18.2] 11/15/2016  . Chronic abdominal pain [R10.9, G89.29] 11/15/2016  . Chronic pain syndrome [G89.4] 11/15/2016  . Acute retention of urine [R33.8] 11/15/2016  . Generalized abdominal pain [R10.84]   . Chronic cluster  headache, not intractable [G44.029]   . Community acquired pneumonia [J18.9]   . 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 HCAP (healthcare-associated pneumonia) [J18.9] 02/16/2016  . Hypokalemia [E87.6] 02/16/2016  . Intractable cluster headache syndrome [G44.001]   . DVT (deep venous thrombosis) (HCC) [I82.409]   . Depression [F32.9]   . GERD (gastroesophageal reflux disease) [K21.9]   . HTN (hypertension) [I10]   . Essential hypertension [I10]   . Gastroesophageal reflux disease without esophagitis [K21.9]   . CAP (community acquired pneumonia) [J18.9]   . Multiple sclerosis (HCC) [G35] 08/02/2013  . ABDOMINAL BLOATING [R14.3, R14.1, R14.2] 10/14/2010  . LOOSE STOOLS [R19.7] 10/14/2010  . PULMONARY EMBOLISM, HX OF [Z86.718] 10/14/2010   Total Time spent with patient: 15 minutes  Past Psychiatric History: MDD with psychosis and anxiety.   Past Medical History:  Past Medical History:  Diagnosis Date  . Abdominal pain, unspecified site   . Anxiety   . Arthritis   . Benign paroxysmal positional vertigo   . Cellulitis and abscess of right leg 04/2017  . Chronic back pain   . Chronic pain syndrome 01/25/2008  . Cluster headache   . Depression    takes Zoloft daily  . DVT (deep venous thrombosis) (HCC)    in the left arm '09  . Gait abnormality    "uses mobile wheelchair, but is ambulatory"  . Gallstones 02/17/2009   resolved after gallbladder surgery.  [name] GERD (gastroesophageal reflux disease)    takes Omeprazole as needed  . Headache(784.0)    cluster headaches frequently-takes Topamax daily  . History of colonoscopy   . HTN (hypertension)    takes  Lisinopril,Verapamil,and Triamterene HCTZ daily  . Insomnia 11/06/2008  . Joint pain   . Joint swelling    03-07-16 "swelling of right wrist" "after a fall-xray done 03-06-16 "no fractures".  . Memory loss    no an issue at present 03-07-16  . Multiple sclerosis (HCC)    Dx. 2005 - Dr. Tinnie Gens follows LOV 4'17 tx. Tysabri monthly  [facility] , [place].  [name] Nonspecific elevation of levels of transaminase or lactic acid dehydrogenase (LDH)   . Other specified visual disturbances   . Other syndromes affecting cervical region   . Pneumonia 2009  . Trigeminal neuralgia     history" Multiple sclerosis"    Past Surgical History:  Procedure Laterality Date  . CHOLECYSTECTOMY  02/20/2009  . COLONOSCOPY WITH PROPOFOL N/A 03/17/2016   Procedure: COLONOSCOPY WITH PROPOFOL;  Surgeon: [name], MD;  Location: [facility] ENDOSCOPY;  Service: Endoscopy;  Laterality: N/A;  . PORT A CATH REVISION N/A 07/06/2015   Procedure: Removal and replacement of PORT A CATH;  Surgeon: [name], MD;  Location: [facility] OR;  Service: General;  Laterality: N/A;  . PORTACATH PLACEMENT N/A 03/27/2014   Procedure: INSERTION PORT-A-CATH;  Surgeon: [name], MD;  Location: [facility] OR;  Service: General;  Laterality: N/A;   Family History:  Family History  Problem Relation Age of Onset  . Cancer Father   . Diabetes Mother    Family Psychiatric  History: Denies  Social History:  Social History   Substance and Sexual Activity  Alcohol Use Yes  . Alcohol/week: 0.0 oz   Comment: occasional     Social History   Substance and Sexual Activity  Drug Use No   Comment: Quit 2011    Social History   Socioeconomic History  . Marital status: Divorced    Spouse name: Not on file  . Number of children: 3  . Years of education: 29 th  . Highest education level: Not on file  Occupational History    Comment: Disabled  Social Needs  . Financial resource strain: Not on file  . Food insecurity:    Worry: Not on file    Inability: Not on file  . Transportation needs:    Medical: Not on file    Non-medical: Not on file  Tobacco Use  . Smoking status: Former Smoker    Packs/day: 1.00    Years: 38.00    Pack years: 38.00    Types: Cigarettes  . Smokeless tobacco: Never Used  . Tobacco comment: cutting back  Substance and Sexual  Activity  . Alcohol use: Yes    Alcohol/week: 0.0 oz    Comment: occasional  . Drug use: No    Comment: Quit 2011  . Sexual activity: Not on file  Lifestyle  . Physical activity:    Days per week: Not on file    Minutes per session: Not on file  . Stress: Not on file  Relationships  . Social connections:    Talks on phone: Not on file    Gets together: Not on file    Attends religious service: Not on file    Active member of club or organization: Not on file    Attends meetings of clubs or organizations: Not on file    Relationship status: Not on file  Other Topics Concern  . Not on file  Social History Narrative   Patient is divorced. Patient is disabled. Because of his MS. Patient has 11 th grade education. 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    Right handed.   Caffeine- One daily.    Sleep: Good  Appetite:  Good  Current Medications: Current Facility-Administered Medications  Medication Dose Route Frequency Provider Last Rate Last Dose  . azithromycin (ZITHROMAX) tablet 500 mg  500 mg Oral Q24H Zannie Cove, MD   500 mg at 03/24/18 1622  . baclofen (LIORESAL) tablet 20 mg  20 mg Oral TID Rai, Ripudeep K, MD   20 mg at 03/25/18 1020  . cefTRIAXone (ROCEPHIN) 1 g in sodium chloride 0.9 % 100 mL IVPB  1 g Intravenous Q24H Zannie Cove, MD   Stopped at 03/24/18 1652  . gabapentin (NEURONTIN) tablet 600 mg  600 mg Oral TID Rai, Ripudeep K, MD   600 mg at 03/25/18 1020  . hydrALAZINE (APRESOLINE) injection 10 mg  10 mg Intravenous Q4H PRN Whiteheart, Kathryn A, NP      . hydrOXYzine (ATARAX/VISTARIL) tablet 25 mg  25 mg Oral TID PRN Rai, Ripudeep K, MD      . insulin aspart (novoLOG) injection 1-3 Units  1-3 Units Subcutaneous Q4H Duayne Cal, NP   1 Units at 03/24/18 1344  . levETIRAcetam (KEPPRA) tablet 500 mg  500 mg Oral BID Rai, Ripudeep K, MD   500 mg at 03/25/18 1019  . ondansetron (ZOFRAN) injection 4 mg  4 mg Intravenous Q6H  PRN Migdalia Dk, MD   4 mg at 03/19/18 1403  . Oxcarbazepine (TRILEPTAL) tablet 300 mg  300 mg Oral TID Danford Bad A, NP   300 mg at 03/25/18 1019  . pantoprazole (PROTONIX) EC tablet 40 mg  40 mg Oral Q0600 Rai, Ripudeep K, MD   40 mg at 03/25/18 0616  . risperiDONE (RISPERDAL) tablet 0.5 mg  0.5 mg Oral BID Cherly Beach, DO   0.5 mg at 03/25/18 1021  . rivaroxaban (XARELTO) tablet 10 mg  10 mg Oral QPM Whiteheart, Kathryn A, NP   10 mg at 03/24/18 1831  . sertraline (ZOLOFT) tablet 50 mg  50 mg Oral Daily Whiteheart, Kathryn A, NP   50 mg at 03/25/18 1019  . sodium chloride flush (NS) 0.9 % injection 10-40 mL  10-40 mL Intracatheter PRN Scatliffe, Gypsy Balsam, MD   10 mL at 03/23/18 1946  . SUMAtriptan (IMITREX) tablet 100 mg  100 mg Oral Q2H PRN Rai, Ripudeep K, MD   100 mg at 03/23/18 2028  . topiramate (TOPAMAX) tablet 100 mg  100 mg Oral QHS Rai, Ripudeep K, MD   100 mg at 03/24/18 2151  . topiramate (TOPAMAX) tablet 50 mg  50 mg Oral q morning - 10a Zannie Cove, MD   50 mg at 03/25/18 1020  . traZODone (DESYREL) tablet 50 mg  50 mg Oral QHS PRN Rai, Ripudeep K, MD   50 mg at 03/24/18 2151  . triamterene-hydrochlorothiazide (MAXZIDE-25) 37.5-25 MG per tablet 1 tablet  1 tablet Oral Daily Scatliffe, Gypsy Balsam, MD   1 tablet at 03/25/18 1021   Facility-Administered Medications Ordered in Other Encounters  Medication Dose Route Frequency Provider Last Rate Last Dose  . sodium chloride 0.9 % injection 10 mL  10 mL Intracatheter PRN Loann Quill, NP   10 mL at 11/07/15 1012    Lab Results:  Results for orders placed or performed during the hospital encounter of 03/17/18 (from the past 48 hour(s))  Glucose, capillary     Status: Abnormal   Collection Time:  03/23/18 12:39 PM  Result Value Ref Range   Glucose-Capillary 106 (H) 65 - 99 mg/dL  Glucose, capillary     Status: Abnormal   Collection Time: 03/23/18  5:54 PM  Result Value Ref Range   Glucose-Capillary  109 (H) 65 - 99 mg/dL  Glucose, capillary     Status: Abnormal   Collection Time: 03/23/18  8:03 PM  Result Value Ref Range   Glucose-Capillary 106 (H) 65 - 99 mg/dL  Glucose, capillary     Status: Abnormal   Collection Time: 03/24/18 12:15 AM  Result Value Ref Range   Glucose-Capillary 139 (H) 65 - 99 mg/dL  Glucose, capillary     Status: Abnormal   Collection Time: 03/24/18  4:23 AM  Result Value Ref Range   Glucose-Capillary 105 (H) 65 - 99 mg/dL  Glucose, capillary     Status: Abnormal   Collection Time: 03/24/18  7:42 AM  Result Value Ref Range   Glucose-Capillary 115 (H) 65 - 99 mg/dL  Glucose, capillary     Status: Abnormal   Collection Time: 03/24/18 12:00 PM  Result Value Ref Range   Glucose-Capillary 139 (H) 65 - 99 mg/dL  Glucose, capillary     Status: Abnormal   Collection Time: 03/24/18  4:21 PM  Result Value Ref Range   Glucose-Capillary 131 (H) 65 - 99 mg/dL   Comment 1 Notify RN   Glucose, capillary     Status: Abnormal   Collection Time: 03/24/18  9:49 PM  Result Value Ref Range   Glucose-Capillary 129 (H) 65 - 99 mg/dL  Glucose, capillary     Status: Abnormal   Collection Time: 03/24/18 11:31 PM  Result Value Ref Range   Glucose-Capillary 108 (H) 65 - 99 mg/dL  Glucose, capillary     Status: Abnormal   Collection Time: 03/25/18  4:39 AM  Result Value Ref Range   Glucose-Capillary 123 (H) 65 - 99 mg/dL  Glucose, capillary     Status: Abnormal   Collection Time: 03/25/18  7:52 AM  Result Value Ref Range   Glucose-Capillary 111 (H) 65 - 99 mg/dL   Comment 1 Document in Chart   Glucose, capillary     Status: Abnormal   Collection Time: 03/25/18 11:10 AM  Result Value Ref Range   Glucose-Capillary 136 (H) 65 - 99 mg/dL    Blood Alcohol level:  Lab Results  Component Value Date   ETH <5 12/17/2016   ETH <5 08/23/2015   Musculoskeletal: Strength & Muscle Tone: within normal limits Gait & Station: UTA since patient was lying in bed. Patient leans:  N/A  Psychiatric Specialty Exam: Physical Exam  Nursing note and vitals reviewed. Constitutional: He is oriented to person, place, and time. He appears well-developed and well-nourished.  HENT:  Head: Normocephalic and atraumatic.  Neck: Normal range of motion.  Respiratory: Effort normal.  Musculoskeletal: Normal range of motion.  Neurological: He is alert and oriented to person, place, and time.  Skin: No rash noted.  Psychiatric: He has a normal mood and affect. His speech is normal and behavior is normal. Thought content normal. Cognition and memory are normal. He expresses impulsivity.    Review of Systems  Constitutional: Negative for chills and fever.  Cardiovascular: Negative for chest pain.  Gastrointestinal: Negative for abdominal pain, constipation, diarrhea, nausea and vomiting.  Psychiatric/Behavioral: Negative for depression, hallucinations, substance abuse and suicidal ideas. The patient is not nervous/anxious and does not have insomnia.   All other systems reviewed and  are negative.   Blood pressure 105/72, pulse 73, temperature 98.1 F (36.7 C), temperature source Oral, resp. rate 16, height 5\' 9"  (1.753 m), weight 75.1 kg (165 lb 9.1 oz), SpO2 95 %.Body mass index is 24.45 kg/m.  General Appearance: Fairly Groomed, middle aged, African American male, wearing a hospital gown with facial hair, balding head and body tattoos who is lying in bed. NAD.   Eye Contact:  Good  Speech:  Clear and Coherent and Normal Rate  Volume:  Normal  Mood:  Euthymic  Affect:  Congruent  Thought Process:  Goal Directed, Linear and Descriptions of Associations: Intact  Orientation:  Full (Time, Place, and Person)  Thought Content:  Logical  Suicidal Thoughts:  No  Homicidal Thoughts:  No  Memory:  Immediate;   Good Recent;   Good Remote;   Good  Judgement:  Poor  Insight:  Shallow  Psychomotor Activity:  Normal  Concentration:  Concentration: Good and Attention Span: Good   Recall:  Good  Fund of Knowledge:  Fair  Language:  Fair  Akathisia:  No  Handed:  Right  AIMS (if indicated):   N/A  Assets:  Housing Social Support  ADL's:  Intact  Cognition:  Patient appears to have a lower level of intellectual functioning.  Sleep:   Okay   Assessment:  [name] is a 57 y.o. male who was admitted with Percocet overdose. He continues to deny intentionally trying to harm self but does not appear to be forthcoming with information and is inconsistent with reports of how he accidentally ingested the medication. His mother reports that he has been depressed and she believes hat he intended to harm himself. He continues to warrant inpatient psychiatric hospitalization for stabilization and treatment.   Treatment Plan Summary: -Patient warrants inpatient psychiatric hospitalization given high risk of harm to self. -Continue Software engineer.  -Continue home medications: Zoloft 50 mg daily for depression/anxiety, Atarax 25 mg TID PRN for anxiety and Trazodone 50 mg qhs PRN for insomnia.  -Continue Risperdal 0.5 mg BID for psychosis.  -Please pursue involuntary commitment if patient refuses voluntary psychiatric hospitalization or attempts to leave the hospital.  -Will sign off on patient at this time. Please consult psychiatry again as needed.     Cherly Beach, DO 03/25/2018, 11:59 AM

## 2018-03-25 NOTE — Progress Notes (Signed)
CSW confirmed that patient is on the waiting list at [facility]. CSW also sent referral to [facility] geropsych for review.   Gary Casco Serrena Linderman LCSW [phone]

## 2018-03-26 LAB — CULTURE, BLOOD (ROUTINE X 2)
CULTURE: NO GROWTH
SPECIAL REQUESTS: ADEQUATE

## 2018-03-26 LAB — GLUCOSE, CAPILLARY
Glucose-Capillary: 102 mg/dL — ABNORMAL HIGH (ref 65–99)
Glucose-Capillary: 106 mg/dL — ABNORMAL HIGH (ref 65–99)
Glucose-Capillary: 115 mg/dL — ABNORMAL HIGH (ref 65–99)
Glucose-Capillary: 132 mg/dL — ABNORMAL HIGH (ref 65–99)
Glucose-Capillary: 119 mg/dL — ABNORMAL HIGH (ref 65–99)

## 2018-03-26 MED ORDER — OXYCODONE-ACETAMINOPHEN 5-325 MG PO TABS: 1.0000 | ORAL_TABLET | Freq: Four times a day (QID) | ORAL | Status: DC | PRN

## 2018-03-26 MED ORDER — OXYCODONE-ACETAMINOPHEN 5-325 MG PO TABS
2.0000 | ORAL_TABLET | Freq: Three times a day (TID) | ORAL | Status: DC | PRN
  Administered 2018-03-26 – 2018-03-31 (×14): 2 via ORAL
  Filled 2018-03-26 (×15): qty 2

## 2018-03-26 NOTE — Progress Notes (Signed)
Pt seen and examined, continues to demand percocet like home regimen Psych re-eval completed, Inpatient Psych still recommended CSW working on Disposition  Zannie Cove, MD

## 2018-03-26 NOTE — Progress Notes (Signed)
Physical Therapy Treatment Patient Details Name: [name] MRN: [id number] DOB: Jun 22, 1961 Today's Date: 03/26/2018    History of Present Illness 57 year old male with PMH as below, which is significant for MS, Chronic pain, Depression, DVT, and gait abnormality.Pt admitted for minimally responsiveness due pt taking 15 tabs of Perfocet 10-325. Denies SI.    PT Comments    Patient seen for mobility progression. Pt needs encouragement to participate in therapy due to c/o bilat LE pain. Pt educated on benefits of OOB mobility and became agitated. Pt agreeable to ambulating ~59ft from EOB to recliner this session and required min A for STS and safe use of AD and balance. Pt will continue to benefit from further skilled PT services to maximize independence and safety with mobility.   Follow Up Recommendations  Other (comment)(looking for inpatient psychiatric facility)     Equipment Recommendations  None recommended by PT    Recommendations for Other Services       Precautions / Restrictions Precautions Precautions: Fall Precaution Comments: sitter due to possible suicide attempt Restrictions Weight Bearing Restrictions: No    Mobility  Bed Mobility Overal bed mobility: Modified Independent Bed Mobility: Supine to Sit           General bed mobility comments: increased time and effort; HOB elevated  Transfers Overall transfer level: Needs assistance Equipment used: Rolling walker (2 wheeled);None Transfers: Sit to/from [employer] to Stand: Min assist         General transfer comment: assist to power up into standing; cues for hand placement  Ambulation/Gait Ambulation/Gait assistance: Min assist Ambulation Distance (Feet): 2 Feet Assistive device: Rolling walker (2 wheeled) Gait Pattern/deviations: Shuffle;Wide base of support;Decreased dorsiflexion - right;Decreased dorsiflexion - left;Trunk flexed     General Gait Details: cues and  assist for safe use of AD; pt agreeable to OOB to chair only   Stairs             Wheelchair Mobility    Modified Rankin (Stroke Patients Only)       Balance Overall balance assessment: Needs assistance Sitting-balance support: Feet supported Sitting balance-Leahy Scale: Fair     Standing balance support: Bilateral upper extremity supported Standing balance-Leahy Scale: Poor                              Cognition Arousal/Alertness: Awake/alert Behavior During Therapy: WFL for tasks assessed/performed Overall Cognitive Status: No family/caregiver present to determine baseline cognitive functioning                                 General Comments: pt agitated easily and not very receptive to therapists cues      Exercises      General Comments        Pertinent Vitals/Pain Pain Assessment: Faces Faces Pain Scale: Hurts a little bit Pain Location: bilat LE Pain Descriptors / Indicators: Aching Pain Intervention(s): Limited activity within patient's tolerance;Repositioned    Home Living                      Prior Function            PT Goals (current goals can now be found in the care plan section) Acute Rehab PT Goals PT Goal Formulation: With patient Time For Goal Achievement: 04/05/18 Potential to Achieve Goals: Fair Progress towards PT goals:  Progressing toward goals    Frequency    Min 3X/week      PT Plan Current plan remains appropriate    Co-evaluation              AM-PAC PT "6 Clicks" Daily Activity  Outcome Measure  Difficulty turning over in bed (including adjusting bedclothes, sheets and blankets)?: A Little Difficulty moving from lying on back to sitting on the side of the bed? : A Little Difficulty sitting down on and standing up from a chair with arms (e.g., wheelchair, bedside commode, etc,.)?: Unable Help needed moving to and from a bed to chair (including a wheelchair)?: A  Little Help needed walking in hospital room?: A Lot Help needed climbing 3-5 steps with a railing? : Total 6 Click Score: 13    End of Session Equipment Utilized During Treatment: Gait belt Activity Tolerance: Patient tolerated treatment well Patient left: with call bell/phone within reach;in chair;with nursing/sitter in room Nurse Communication: Mobility status PT Visit Diagnosis: Unsteadiness on feet (R26.81)     Time: 6962-9528 PT Time Calculation (min) (ACUTE ONLY): 15 min  Charges:  $Therapeutic Activity: 8-22 mins                    G Codes:       Erline Levine, PTA Pager: [phone]     Carolynne Edouard 03/26/2018, 4:03 PM

## 2018-03-26 NOTE — Progress Notes (Addendum)
Patient denied by Margarito Courser due to MS and chronic pain. Also denied by Elmyra Ricks and [place].   Gary Casco Afton Lavalle LCSW [phone]

## 2018-03-27 LAB — GLUCOSE, CAPILLARY
GLUCOSE-CAPILLARY: 106 mg/dL — AB (ref 65–99)
GLUCOSE-CAPILLARY: 118 mg/dL — AB (ref 65–99)
GLUCOSE-CAPILLARY: 138 mg/dL — AB (ref 65–99)
GLUCOSE-CAPILLARY: 92 mg/dL (ref 65–99)
Glucose-Capillary: 135 mg/dL — ABNORMAL HIGH (ref 65–99)
Glucose-Capillary: 102 mg/dL — ABNORMAL HIGH (ref 65–99)

## 2018-03-27 NOTE — Progress Notes (Signed)
Pt seen no changes -continues to be frustrated about waiting for inpatient psychiatry -MS-related leg pain better controlled on current regimen  Zannie Cove, MD

## 2018-03-28 LAB — GLUCOSE, CAPILLARY
GLUCOSE-CAPILLARY: 90 mg/dL (ref 65–99)
GLUCOSE-CAPILLARY: 117 mg/dL — AB (ref 65–99)
GLUCOSE-CAPILLARY: 118 mg/dL — AB (ref 65–99)
Glucose-Capillary: 105 mg/dL — ABNORMAL HIGH (ref 65–99)
Glucose-Capillary: 129 mg/dL — ABNORMAL HIGH (ref 65–99)
Glucose-Capillary: 96 mg/dL (ref 65–99)

## 2018-03-28 NOTE — Progress Notes (Signed)
[facility]                                                                              Patient Demographics  [name], is a 57 y.o. male, DOB - 20-Jul-1961, WUJ:[id number]  Admit date - 03/17/2018   Admitting Physician Carin Hock, MD  Outpatient Primary MD for the patient is Fleet Contras, MD LOS - 10  days   Chief Complaint  Patient presents with  . Drug Overdose       Brief summary   Admitted by CCM to ICU on 82/75 57 year old male with PMH as below, which is significant for MS, Chronic pain, Depression, DVT, and gait abnormality. His family noted him to be minimally responsive on the evening hours of 5/8 and called EMS. Upon their arrival the patient had agonal respirations, but was pulsatile. Responded to intranasal narcan. He remained somnolent upon arrival to ED and was given IV narcan. He responded very well and was started on infusion. Patient reported taking 15 tablets of Percocet 10-325. He claims overdose was accidental and he had no intention of harming himself.  PCCM asked to admit for Narcan infusion  Assessment & Plan     Overdose by acetaminophen, accidental or unintentional, initial encounter - Patient had arrived to ED with minimal responsiveness and agonal respirations.   -patient had reported taking 15 tablets of Percocet, denies SI -He was initially admitted to ICU, on Narcan infusion, mentation improved, currently alert and oriented -Tylenol level 138 on admission, then<10. 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  Dr.Rai d/w NEuro Dr.Arora on 5/13, No need for IV steroids -At baseline, states he is wheelchair-bound.   - Placed on baclofen 20 mg 3 times daily, he also receives Ocrevus infusion every 6 months, followed by neurology outpatient Dr. Tinnie Gens. -Continue Keppra, Trileptal, encouraged PT, out of bed  Hypokalemia Resolved    GERD (gastroesophageal reflux disease) -Continue Protonix    Essential hypertension - Currently stable, continue triamterene HCTZ    Right shoulder pain, chronic pain syndrome -Patient reports right shoulder has been hurting for last 2 weeks, fell on it  -Right shoulder x-ray negative for any fracture dislocation  Bilateral pneumonia  - PCT 14.1, CXR shows multilobar PNA - placed on azithromax and IV rocephin till 5/17  Migraine headaches Placed back on Topamax, Imitrex as needed   Code Status: Full DVT Prophylaxis: Xarelto Family Communication: Discussed in detail with the patient, all imaging results, lab results explained to the patient  Disposition Plan: Social work following, disposition continues to remain a challenge due to difficulty finding inpatient psych facility   Time Spent in minutes    Procedures:  MRI brain C-spine  Consultants:   Patient was  admitted by [facility]  Antimicrobials:      Medications  Scheduled Meds: . baclofen  20 mg Oral TID  . gabapentin  600 mg Oral TID  . insulin aspart  1-3 Units Subcutaneous Q4H  . levETIRAcetam  500 mg Oral BID  . Oxcarbazepine  300 mg Oral TID  . pantoprazole  40 mg Oral Q0600  . risperiDONE  0.5 mg Oral BID  . rivaroxaban  10 mg Oral QPM  . sertraline  50 mg Oral Daily  . topiramate  100 mg Oral QHS  . topiramate  50 mg Oral q morning - 10a  . triamterene-hydrochlorothiazide  1 tablet Oral Daily   Continuous Infusions:  PRN Meds:.hydrALAZINE, hydrOXYzine, ondansetron (ZOFRAN) IV,  oxyCODONE-acetaminophen, sodium chloride flush, SUMAtriptan, traZODone   Antibiotics   Anti-infectives (From admission, onward)   Start     Dose/Rate Route Frequency Ordered Stop   03/22/18 1600  azithromycin (ZITHROMAX) tablet 500 mg     500 mg Oral Every 24 hours 03/22/18 1126 03/26/18 1631   03/20/18 1545  azithromycin (ZITHROMAX) 500 mg in sodium chloride 0.9 % 250 mL IVPB  Status:  Discontinued     500 mg 250 mL/hr over 60 Minutes Intravenous Every 24 hours 03/20/18 1531 03/22/18 1124   03/20/18 1545  cefTRIAXone (ROCEPHIN) 1 g in sodium chloride 0.9 % 100 mL IVPB     1 g 200 mL/hr over 30 Minutes Intravenous Every 24 hours 03/20/18 1531 03/26/18 1701        Subjective:   Bodie Abernethy was seen and examined today -upset about still waiting for psych bed  Objective:   Vitals:   03/27/18 0454 03/27/18 1238 03/27/18 2033 03/28/18 0445  BP: 97/65 113/71 112/78 106/74  Pulse: 63 77 80 (!) 57  Resp: 18  16 16   Temp: 97.7 F (36.5 C)  98.1 F (36.7 C) 97.9 F (36.6 C)  TempSrc: Oral  Oral Oral  SpO2: 100% 96% 96% 98%  Weight: 73.1 kg (161 lb 2.5 oz)   76.4 kg (168 lb 6.9 oz)  Height:        Intake/Output Summary (Last 24 hours) at 03/28/2018 1303 Last data filed at 03/28/2018 1000 Gross per 24 hour  Intake 942 ml  Output 1850 ml  Net -908 ml     Wt Readings from Last 3 Encounters:  03/28/18 76.4 kg (168 lb 6.9 oz)  03/01/18 78.5 kg (173 lb)  12/19/17 83.5 kg (184 lb)     Exam  Gen: chronically ill-appearing male, Awake, Alert, Oriented X 3,  HEENT: PERRLA, Neck supple, no JVD Lungs: Good air movement bilaterally, CTAB CVS: RRR,No Gallops,Rubs or new Murmurs Abd: soft, Non tender, non distended, BS present Extremities: No Cyanosis, Clubbing or edema Skin: no new rashes Neuro: spastic weakness of both legs lower legs will Psych: Normal affect and demeanor, alert and oriented x3    Data Reviewed:  I have personally reviewed following labs and imaging  studies  Micro Results Recent Results (from the past 240 hour(s))  Urine Culture     Status: None   Collection Time: 03/20/18 11:33 AM  Result Value Ref Range Status   Specimen Description URINE, CLEAN CATCH  Final   Special Requests NONE  Final   Culture   Final    NO GROWTH Performed at [facility], [address] [place], [place] [address]    Report Status 03/21/2018 FINAL  Final  Culture, blood (routine x 2)  Status: None   Collection Time: 03/20/18 12:33 PM  Result Value Ref Range Status   Specimen Description BLOOD BLOOD LEFT HAND  Final   Special Requests   Final    BOTTLES DRAWN AEROBIC ONLY Blood Culture results may not be optimal due to an inadequate volume of blood received in culture bottles   Culture   Final    NO GROWTH 5 DAYS Performed at [facility], [address] [place], [place] [address]    Report Status 03/25/2018 FINAL  Final  Culture, blood (routine x 2)     Status: None   Collection Time: 03/21/18  3:33 AM  Result Value Ref Range Status   Specimen Description BLOOD PORTA CATH  Final   Special Requests   Final    BOTTLES DRAWN AEROBIC ONLY Blood Culture adequate volume   Culture   Final    NO GROWTH 5 DAYS Performed at [facility], [address] [place], [place] [address]    Report Status 03/26/2018 FINAL  Final    Radiology Reports Dg Chest 2 View  Result Date: 03/04/2018 CLINICAL DATA:  Chest pain EXAM: CHEST - 2 VIEW COMPARISON:  12/19/2017 FINDINGS: Right Port-A-Cath remains in place, unchanged. Heart and mediastinal contours are within normal limits. No focal opacities or effusions. No acute bony abnormality. IMPRESSION: No active cardiopulmonary disease. Electronically Signed   By: Charlett Nose M.D.   On: 03/04/2018 10:17   Dg Shoulder Right  Result Date: 03/19/2018 CLINICAL DATA:  Pain after a fall.  Multiple sclerosis. EXAM: RIGHT SHOULDER - 2+ VIEW COMPARISON:  None. FINDINGS: There is no evidence of fracture or  dislocation. There is no evidence of arthropathy or other focal bone abnormality. Soft tissues are unremarkable. IMPRESSION: Negative. Electronically Signed   By: Elsie Stain M.D.   On: 03/19/2018 23:17   Ct Head Wo Contrast  Result Date: 03/04/2018 CLINICAL DATA:  Chest pain and weakness for a week. Headache and right arm pain. EXAM: CT HEAD WITHOUT CONTRAST TECHNIQUE: Contiguous axial images were obtained from the base of the skull through the vertex without intravenous contrast. COMPARISON:  12/21/2017 FINDINGS: BRAIN: The ventricles and sulci are normal. No intraparenchymal hemorrhage, mass effect nor midline shift. No acute large vascular territory infarcts. Grey-white matter distinction is maintained. The basal ganglia are unremarkable. No abnormal extra-axial fluid collections. Basal cisterns are not effaced and midline. The brainstem and cerebellar hemispheres are without acute abnormalities. VASCULAR: No hyperdense vessel or unexpected calcification. SKULL/SOFT TISSUES: No acute calvarial fracture. No significant soft tissue swelling. ORBITS/SINUSES: The included ocular globes and orbital contents are normal.The mastoid air cells are clear. The included paranasal sinuses are well-aerated. OTHER: None. IMPRESSION: No acute abnormality noted.  Stable appearance of the brain. Electronically Signed   By: Tollie Eth M.D.   On: 03/04/2018 19:14   Mr Laqueta Jean LO Contrast  Result Date: 03/22/2018 CLINICAL DATA:  Multiple sclerosis.  New neurological event. EXAM: MRI HEAD WITHOUT AND WITH CONTRAST TECHNIQUE: Multiplanar, multiecho pulse sequences of the brain and surrounding structures were obtained without and with intravenous contrast. CONTRAST:  <See Chart> MULTIHANCE GADOBENATE DIMEGLUMINE 529 MG/ML IV SOLN COMPARISON:  MRI 12/20/2017 and 08/06/2017. FINDINGS: Brain: Diffusion imaging does not show any acute or subacute infarction or other cause of restricted diffusion. Scattered foci of T2 and FLAIR  signal remain evident in the hemispheric white matter, with a nonspecific pattern. No new or progressive lesions. No lesions show contrast enhancement. No cortical abnormality.  No mass lesion, hemorrhage, hydrocephalus or extra-axial collection. 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 This is unchanged as far back as 2010. Vascular: Major vessels at the base of the brain show flow. Skull and upper cervical spine: Negative Sinuses/Orbits: Clear/normal Other: None IMPRESSION: Stable appearance of the brain. Few scattered punctate nonspecific T2 and FLAIR bright foci of the hemispheric white matter. No new or progressive lesions. No abnormal contrast enhancement. 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 Electronically Signed   By: Paulina Fusi M.D.   On: 03/22/2018 16:13   Mr Cervical Spine W Wo Contrast  Result Date: 03/22/2018 CLINICAL DATA:  Multiple sclerosis.  New neurological event. EXAM: MRI CERVICAL SPINE WITHOUT AND WITH CONTRAST TECHNIQUE: Multiplanar and multiecho pulse sequences of the cervical spine, to include the craniocervical junction and cervicothoracic junction, were obtained without and with intravenous contrast. CONTRAST:  20 cc MultiHance COMPARISON:  12/20/2017.  08/06/2017 FINDINGS: Alignment: Normal Vertebrae: Normal Cord: Multiple foci of increased T2 signal are again demonstrated in the cord beginning at C2-3 and extending as low as T1-2. Today's study shows less motion degradation in the lesions are more clearly depicted, but I think they are similar in number and distribution. None show abnormal contrast enhancement. Posterior Fossa, vertebral arteries, paraspinal tissues: See results of brain MRI. Disc levels: Degenerative spondylosis at C3-4, C4-5 and C5-6 with narrowing of the canal but no cord compression. Right foraminal stenosis at C3-4 and left foraminal stenosis at  C3-4 and C5-6. IMPRESSION: Today's study is better exam, with less motion degradation. Multiple cervical spine and upper thoracic cord lesions are more clearly depicted, consistent with chronic demyelinating disease. In reviewing multiple prior exams, I do not think that there are new or visibly progressive lesions. None show contrast enhancement. Degenerative spondylosis from C3-4 through C5-6 with foraminal narrowing bilaterally at C3-4 and on the left at C5-6. Electronically Signed   By: Paulina Fusi M.D.   On: 03/22/2018 16:08   Dg Chest Port 1 View  Result Date: 03/20/2018 CLINICAL DATA:  Fever EXAM: PORTABLE CHEST 1 VIEW COMPARISON:  03/04/2018 FINDINGS: Hazy airspace disease in bilateral upper lobes and bilateral lower lobes most severe in the right lower lobe most concerning for multilobar pneumonia. No significant pleural effusion or pneumothorax. Stable cardiomediastinal silhouette. Right-sided Port-A-Cath with the tip projecting over the cavoatrial junction. No acute osseous abnormality. IMPRESSION: Findings concerning for multilobar pneumonia. Electronically Signed   By: Elige Ko   On: 03/20/2018 13:22    Lab Data:  CBC: Recent Labs  Lab 03/22/18 0332  WBC 10.4  HGB 11.1*  HCT 33.9*  MCV 85.4  PLT 181   Basic Metabolic Panel: Recent Labs  Lab 03/22/18 0332 03/23/18 0429  NA 141 141  K 3.4* 3.5  CL 111 112*  CO2 21* 18*  GLUCOSE 106* 114*  BUN <5* 7  CREATININE 0.70 0.69  CALCIUM 8.5* 8.8*   GFR: Estimated Creatinine Clearance: 101.9 mL/min (by C-G formula based on SCr of 0.69 mg/dL). Liver Function Tests: Recent Labs  Lab 03/22/18 0332 03/23/18 0429  AST 51* 103*  ALT 108* 139*  ALKPHOS 69 69  BILITOT 0.8 0.6  PROT 6.2* 5.9*  ALBUMIN 2.5* 2.5*   No results for input(s): LIPASE, AMYLASE in the last 168 hours. No results for input(s): AMMONIA in the last 168 hours. Coagulation Profile: Recent Labs  Lab 03/22/18 [phone]  INR 1.47   Cardiac Enzymes: No  results for input(s): CKTOTAL, CKMB, CKMBINDEX, TROPONINI in the last 168 hours. BNP (last 3 results) No results for input(s): PROBNP in the last 8760 hours. HbA1C: No results for input(s): HGBA1C in the last 72 hours. CBG: Recent Labs  Lab 03/27/18 1957 03/28/18 0008 03/28/18 0438 03/28/18 0802 03/28/18 1227  GLUCAP 102* 105* 90 117* 96   Lipid Profile: No results for input(s): CHOL, HDL, LDLCALC, TRIG, CHOLHDL, LDLDIRECT in the last 72 hours. Thyroid Function Tests: No results for input(s): TSH, T4TOTAL, FREET4, T3FREE, THYROIDAB in the last 72 hours. Anemia Panel: No results for input(s): VITAMINB12, FOLATE, FERRITIN, TIBC, IRON, RETICCTPCT in the last 72 hours. Urine analysis:    Component Value Date/Time   COLORURINE YELLOW 03/20/2018 1411   APPEARANCEUR CLEAR 03/20/2018 1411   LABSPEC 1.010 03/20/2018 1411   PHURINE 5.0 03/20/2018 1411   GLUCOSEU NEGATIVE 03/20/2018 1411   HGBUR NEGATIVE 03/20/2018 1411   BILIRUBINUR NEGATIVE 03/20/2018 1411   KETONESUR NEGATIVE 03/20/2018 1411   PROTEINUR NEGATIVE 03/20/2018 1411   UROBILINOGEN 1.0 05/14/2014 2113   NITRITE NEGATIVE 03/20/2018 1411   LEUKOCYTESUR NEGATIVE 03/20/2018 1411     Zannie Cove M.D. [facility] 03/28/2018, 1:03 PM  Page via [email or web address] - password [email or web address]  Call night coverage person after 7pm

## 2018-03-29 LAB — GLUCOSE, CAPILLARY
GLUCOSE-CAPILLARY: 112 mg/dL — AB (ref 65–99)
GLUCOSE-CAPILLARY: 112 mg/dL — AB (ref 65–99)

## 2018-03-29 NOTE — Progress Notes (Signed)
Had prayer with patient.  Met him last week when doing Advanced Directives.  Consult regarding suicidal thoughts.  Patient seemed to be doing ok. Had a good conversation.  He dis ask if we had any devotionals by JD Rakes and I shared we did not. Visit was cut short due to being paged to another call.  Had prayer with the patient. Conard Novak, Chaplain   03/29/18 1300  Clinical Encounter Type  Visited With Patient;Other (Comment) Freight forwarder)  Visit Type Follow-up;Spiritual support;Behavioral Health  Referral From Physician  Consult/Referral To Chaplain  Spiritual Encounters  Spiritual Needs Prayer

## 2018-03-29 NOTE — Progress Notes (Signed)
Patient seen, No changes, remains clinically stable, continues to away from inpatient psychiatric hospitalization  Zannie Cove, MD

## 2018-03-29 NOTE — Clinical Social Work Note (Addendum)
Confirmed pt still on [facility] wait list. Recent psych note faxed.  [place], [place] [phone]

## 2018-03-30 ENCOUNTER — Ambulatory Visit: Payer: 59 | Admitting: Speech Pathology

## 2018-03-30 NOTE — Progress Notes (Signed)
[facility]                                                                              Patient Demographics  [name], is a 57 y.o. male, DOB - Oct 10, 1961, ZOX:[id number]  Admit date - 03/17/2018   Admitting Physician Carin Hock, MD  Outpatient Primary MD for the patient is Fleet Contras, MD LOS - 12  days   Chief Complaint  Patient presents with  . Drug Overdose       Brief summary   Admitted by CCM to ICU on 21/74 57 year old male with PMH as below, which is significant for MS, Chronic pain, Depression, DVT, and gait abnormality. His family noted him to be minimally responsive on the evening hours of 5/8 and called EMS. Upon their arrival the patient had agonal respirations, remained somnolent upon arrival to ED and was given IV narcan. He responded very well and was started on infusion. Patient reported taking 15 tablets of Percocet 10-325. He claims overdose was accidental and he had no intention of harming himself.  PCCM admitted for Narcan infusion, also given NAC due to elevated tylenol elvel -Improved Transferred to [facility], Psych eval completed, now stable awaiting inpatient Psych bed  Assessment & Plan     Overdose by acetaminophen, accidental or unintentional, initial encounter - Patient had arrived to ED with minimal responsiveness and agonal respirations.   -patient had reported taking 15 tablets of Percocet, denies SI -He was initially admitted to ICU on Narcan infusion, mentation improved, currently alert and oriented -Tylenol level 138 on admission, then<10. 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  Dr.Rai d/w NEuro Dr.Arora on 5/13, No need for IV steroids -At baseline, states he is wheelchair-bound.   - Placed on baclofen 20 mg 3 times daily, he also receives Ocrevus infusion every 6 months, followed by neurology outpatient Dr. Tinnie Gens. -Continue Keppra, Trileptal, encouraged PT, out of bed  Hypokalemia -Resolved    GERD (gastroesophageal reflux disease) -Continue Protonix    Essential hypertension - Currently stable, continue triamterene HCTZ    Right shoulder pain, chronic pain syndrome -Patient reports right shoulder has been hurting for last 2 weeks, fell on it  -Right shoulder x-ray negative for any fracture dislocation  Bilateral pneumonia  - PCT 14.1, CXR shows multilobar PNA - placed on azithromax and IV rocephin till 5/17, now off  Migraine headaches Placed back on Topamax, Imitrex as needed   Code Status: Full DVT Prophylaxis: Xarelto Family Communication: none at bedside Disposition Plan: Social work following, Awaiting inpatient Psych facility   Time Spent in minutes    Procedures:  MRI brain C-spine  Consultants:   Patient was admitted by [facility] Psychiat[facility]  Antimicrobials:      Medications  Scheduled Meds: . baclofen  20 mg Oral TID  . gabapentin  600 mg Oral TID  . levETIRAcetam  500 mg Oral BID  . Oxcarbazepine  300 mg Oral TID  . pantoprazole  40 mg Oral Q0600  . risperiDONE  0.5 mg Oral BID  . rivaroxaban  10 mg Oral QPM  . sertraline  50 mg Oral Daily  . topiramate  100 mg Oral QHS  . topiramate  50 mg Oral q morning - 10a  . triamterene-hydrochlorothiazide  1 tablet Oral Daily   Continuous Infusions:  PRN Meds:.hydrALAZINE, hydrOXYzine, ondansetron (ZOFRAN) IV, oxyCODONE-acetaminophen, sodium chloride flush, SUMAtriptan, traZODone   Antibiotics    Anti-infectives (From admission, onward)   Start     Dose/Rate Route Frequency Ordered Stop   03/22/18 1600  azithromycin (ZITHROMAX) tablet 500 mg     500 mg Oral Every 24 hours 03/22/18 1126 03/26/18 1631   03/20/18 1545  azithromycin (ZITHROMAX) 500 mg in sodium chloride 0.9 % 250 mL IVPB  Status:  Discontinued     500 mg 250 mL/hr over 60 Minutes Intravenous Every 24 hours 03/20/18 1531 03/22/18 1124   03/20/18 1545  cefTRIAXone (ROCEPHIN) 1 g in sodium chloride 0.9 % 100 mL IVPB     1 g 200 mL/hr over 30 Minutes Intravenous Every 24 hours 03/20/18 1531 03/26/18 1701        Subjective:   [name] was seen and examined today -no new events, frustrated about still having to wait for a psych bed  Objective:   Vitals:   03/29/18 2149 03/30/18 0500 03/30/18 0502 03/30/18 1147  BP: 115/62  101/67 97/67  Pulse: 83  68 80  Resp: 16  14 15   Temp: 98.2 F (36.8 C)  98.2 F (36.8 C) 98.1 F (36.7 C)  TempSrc: Oral  Oral Oral  SpO2: 97%  99% 98%  Weight:  78.7 kg (173 lb 8 oz)    Height:        Intake/Output Summary (Last 24 hours) at 03/30/2018 1327 Last data filed at 03/30/2018 1030 Gross per 24 hour  Intake 460 ml  Output 1750 ml  Net -1290 ml     Wt Readings from Last 3 Encounters:  03/30/18 78.7 kg (173 lb 8 oz)  03/01/18 78.5 kg (173 lb)  12/19/17 83.5 kg (184 lb)     Exam  Gen: chronically ill male, AAOx3, stuttering speech HEENT: PERRLA, Neck supple, no JVD Lungs: Good air movement bilaterally, CTAB CVS: RRR,No Gallops,Rubs or new Murmurs Abd: soft, Non tender, non distended, BS present Extremities: No Cyanosis, Clubbing or edema Skin: no new rashes Neuro: spastic weakness of both legs lower legs  Psych: Normal affect and demeanor, alert and oriented x3    Data Reviewed:  I have personally reviewed following labs and imaging studies  Micro Results Recent Results (from the past 240 hour(s))  Culture, blood (routine x 2)     Status: None    Collection Time: 03/21/18  3:33 AM  Result Value Ref Range Status   Specimen Description BLOOD PORTA CATH  Final   Special Requests   Final    BOTTLES DRAWN AEROBIC ONLY Blood Culture adequate volume   Culture   Final    NO GROWTH 5 DAYS Performed at [facility], [address], [place], [place] [address]    Report Status 03/26/2018 FINAL  Final    Radiology Reports Dg Chest 2 View  Result Date: 03/04/2018 CLINICAL DATA:  Chest pain EXAM: CHEST - 2 VIEW COMPARISON:  12/19/2017 FINDINGS: Right Port-A-Cath remains in place, unchanged. Heart and mediastinal contours are within normal limits. No focal opacities or effusions. No acute bony abnormality. IMPRESSION: No active cardiopulmonary disease. Electronically Signed   By: Charlett Nose M.D.   On: 03/04/2018 10:17   Dg Shoulder Right  Result Date: 03/19/2018 CLINICAL DATA:  Pain after a fall.  Multiple sclerosis. EXAM: RIGHT SHOULDER - 2+ VIEW COMPARISON:  None. FINDINGS: There is no evidence of fracture or dislocation. There is no evidence of arthropathy or other focal bone abnormality. Soft tissues are unremarkable. IMPRESSION: Negative. Electronically Signed   By: Elsie Stain M.D.   On: 03/19/2018 23:17   Ct Head Wo Contrast  Result Date: 03/04/2018 CLINICAL DATA:  Chest pain and weakness for a week. Headache and right arm pain. EXAM: CT HEAD WITHOUT CONTRAST TECHNIQUE: Contiguous axial images were obtained from the base of the skull through the vertex without intravenous contrast. COMPARISON:  12/21/2017 FINDINGS: BRAIN: The ventricles and sulci are normal. No intraparenchymal hemorrhage, mass effect nor midline shift. No acute large vascular territory infarcts. Grey-white matter distinction is maintained. The basal ganglia are unremarkable. No abnormal extra-axial fluid collections. Basal cisterns are not effaced and midline. The brainstem and cerebellar hemispheres are without acute abnormalities. VASCULAR: No hyperdense vessel or  unexpected calcification. SKULL/SOFT TISSUES: No acute calvarial fracture. No significant soft tissue swelling. ORBITS/SINUSES: The included ocular globes and orbital contents are normal.The mastoid air cells are clear. The included paranasal sinuses are well-aerated. OTHER: None. IMPRESSION: No acute abnormality noted.  Stable appearance of the brain. Electronically Signed   By: Tollie Eth M.D.   On: 03/04/2018 19:14   Mr Laqueta Jean ZC Contrast  Result Date: 03/22/2018 CLINICAL DATA:  Multiple sclerosis.  New neurological event. EXAM: MRI HEAD WITHOUT AND WITH CONTRAST TECHNIQUE: Multiplanar, multiecho pulse sequences of the brain and surrounding structures were obtained without and with intravenous contrast. CONTRAST:  <See Chart> MULTIHANCE GADOBENATE DIMEGLUMINE 529 MG/ML IV SOLN COMPARISON:  MRI 12/20/2017 and 08/06/2017. FINDINGS: Brain: Diffusion imaging does not show any acute or subacute infarction or other cause of restricted diffusion. Scattered foci of T2 and FLAIR signal remain evident in the hemispheric white matter, with a nonspecific pattern. No new or progressive lesions. No lesions show contrast enhancement. No cortical abnormality. No mass lesion, hemorrhage, hydrocephalus or extra-axial collection. 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 This is unchanged as far back as 2010. Vascular: Major vessels at the base of the brain show flow. Skull and upper cervical spine: Negative Sinuses/Orbits: Clear/normal Other: None IMPRESSION: Stable appearance of the brain. Few scattered punctate nonspecific T2 and FLAIR bright foci of the hemispheric white matter. No new or progressive lesions. No abnormal contrast enhancement. 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 Electronically Signed   By: Paulina Fusi M.D.   On: 03/22/2018 16:13   Mr Cervical Spine W Wo Contrast  Result Date:  03/22/2018 CLINICAL DATA:  Multiple sclerosis.  New neurological event. EXAM: MRI CERVICAL SPINE WITHOUT AND WITH CONTRAST TECHNIQUE: Multiplanar and multiecho pulse sequences of the cervical spine, to include the craniocervical junction and cervicothoracic junction, were obtained without and with intravenous contrast. CONTRAST:  20 cc MultiHance COMPARISON:  12/20/2017.  08/06/2017 FINDINGS: Alignment: Normal Vertebrae: Normal Cord: Multiple foci of increased T2 signal are again  demonstrated in the cord beginning at C2-3 and extending as low as T1-2. Today's study shows less motion degradation in the lesions are more clearly depicted, but I think they are similar in number and distribution. None show abnormal contrast enhancement. Posterior Fossa, vertebral arteries, paraspinal tissues: See results of brain MRI. Disc levels: Degenerative spondylosis at C3-4, C4-5 and C5-6 with narrowing of the canal but no cord compression. Right foraminal stenosis at C3-4 and left foraminal stenosis at C3-4 and C5-6. IMPRESSION: Today's study is better exam, with less motion degradation. Multiple cervical spine and upper thoracic cord lesions are more clearly depicted, consistent with chronic demyelinating disease. In reviewing multiple prior exams, I do not think that there are new or visibly progressive lesions. None show contrast enhancement. Degenerative spondylosis from C3-4 through C5-6 with foraminal narrowing bilaterally at C3-4 and on the left at C5-6. Electronically Signed   By: Paulina Fusi M.D.   On: 03/22/2018 16:08   Dg Chest Port 1 View  Result Date: 03/20/2018 CLINICAL DATA:  Fever EXAM: PORTABLE CHEST 1 VIEW COMPARISON:  03/04/2018 FINDINGS: Hazy airspace disease in bilateral upper lobes and bilateral lower lobes most severe in the right lower lobe most concerning for multilobar pneumonia. No significant pleural effusion or pneumothorax. Stable cardiomediastinal silhouette. Right-sided Port-A-Cath with the tip  projecting over the cavoatrial junction. No acute osseous abnormality. IMPRESSION: Findings concerning for multilobar pneumonia. Electronically Signed   By: Elige Ko   On: 03/20/2018 13:22    Lab Data:  CBC: No results for input(s): WBC, NEUTROABS, HGB, HCT, MCV, PLT in the last 168 hours. Basic Metabolic Panel: No results for input(s): NA, K, CL, CO2, GLUCOSE, BUN, CREATININE, CALCIUM, MG, PHOS in the last 168 hours. GFR: Estimated Creatinine Clearance: 101.9 mL/min (by C-G formula based on SCr of 0.69 mg/dL). Liver Function Tests: No results for input(s): AST, ALT, ALKPHOS, BILITOT, PROT, ALBUMIN in the last 168 hours. No results for input(s): LIPASE, AMYLASE in the last 168 hours. No results for input(s): AMMONIA in the last 168 hours. Coagulation Profile: No results for input(s): INR, PROTIME in the last 168 hours. Cardiac Enzymes: No results for input(s): CKTOTAL, CKMB, CKMBINDEX, TROPONINI in the last 168 hours. BNP (last 3 results) No results for input(s): PROBNP in the last 8760 hours. HbA1C: No results for input(s): HGBA1C in the last 72 hours. CBG: Recent Labs  Lab 03/28/18 1227 03/28/18 1608 03/28/18 2023 03/29/18 0037 03/29/18 0527  GLUCAP 96 129* 118* 112* 112*   Lipid Profile: No results for input(s): CHOL, HDL, LDLCALC, TRIG, CHOLHDL, LDLDIRECT in the last 72 hours. Thyroid Function Tests: No results for input(s): TSH, T4TOTAL, FREET4, T3FREE, THYROIDAB in the last 72 hours. Anemia Panel: No results for input(s): VITAMINB12, FOLATE, FERRITIN, TIBC, IRON, RETICCTPCT in the last 72 hours. Urine analysis:    Component Value Date/Time   COLORURINE YELLOW 03/20/2018 1411   APPEARANCEUR CLEAR 03/20/2018 1411   LABSPEC 1.010 03/20/2018 1411   PHURINE 5.0 03/20/2018 1411   GLUCOSEU NEGATIVE 03/20/2018 1411   HGBUR NEGATIVE 03/20/2018 1411   BILIRUBINUR NEGATIVE 03/20/2018 1411   KETONESUR NEGATIVE 03/20/2018 1411   PROTEINUR NEGATIVE 03/20/2018 1411    UROBILINOGEN 1.0 05/14/2014 2113   NITRITE NEGATIVE 03/20/2018 1411   LEUKOCYTESUR NEGATIVE 03/20/2018 1411     Zannie Cove M.D. [facility] 03/30/2018, 1:27 PM  Page via [email or web address] - password [email or web address]  Call night coverage person after 7pm

## 2018-03-30 NOTE — Progress Notes (Signed)
Physical Therapy Treatment Patient Details Name: [name] MRN: [id number] DOB: [birth year] Today's Date: 03/30/2018    History of Present Illness Pt is a 57 y.o. male with PMH significant for MS, chronic pain, depression, DVT, and gait abnormality, admitted 03/17/18 after found minimally responsive with acetaminophen overdose; denies SI.   PT Comments    Pt initially agreeable to participate in mobility with PT; upon return to room with wheelchair, c/o significant headache due to "all the electricity." Agreeable to participate in seated BLE therex; significant weakness noted in RLE requiring AAROM for majority of movements. Pt had just ambulated to bathroom with sitter, using RW and min guard for balance; reports his R foot drags but unwilling to amb with PT this session. Will continue to follow acutely.    Follow Up Recommendations  Other (comment)(IP psych facility)     Equipment Recommendations  None recommended by PT    Recommendations for Other Services       Precautions / Restrictions Precautions Precautions: Fall Precaution Comments: Suicide sitter Restrictions Weight Bearing Restrictions: No    Mobility  Bed Mobility               General bed mobility comments: Received sitting in recliner  Transfers                 General transfer comment: Pt adamantly declining; agreeable to seated therex  Ambulation/Gait                 Stairs             Wheelchair Mobility    Modified Rankin (Stroke Patients Only)       Balance Overall balance assessment: Needs assistance Sitting-balance support: Feet supported Sitting balance-Leahy Scale: Good                                      Cognition Arousal/Alertness: Awake/alert Behavior During Therapy: Flat affect Overall Cognitive Status: No family/caregiver present to determine baseline cognitive functioning Area of Impairment: Safety/judgement;Attention;Following  commands                   Current Attention Level: Selective Memory: Decreased recall of precautions;Decreased short-term memory Following Commands: Follows multi-step commands inconsistently Safety/Judgement: Decreased awareness of safety;Decreased awareness of deficits     General Comments: Pt agreeable to participate, but upon return to room c/o significant headache "caused by all the electricity" and declining despite max encouragement      Exercises General Exercises - Lower Extremity Ankle Circles/Pumps: AROM;Left;10 reps Long Arc Quad: AROM;Left;AAROM;Right;10 reps Hip Flexion/Marching: AROM;Left;AAROM;Right;10 reps Toe Raises: AROM;Both;5 reps Heel Raises: AROM;Left;5 reps    General Comments        Pertinent Vitals/Pain Pain Assessment: Faces Faces Pain Scale: Hurts whole lot Pain Location: Head Pain Descriptors / Indicators: Throbbing;Headache Pain Intervention(s): Limited activity within patient's tolerance;Patient requesting pain meds-RN notified    Home Living                      Prior Function            PT Goals (current goals can now be found in the care plan section) Acute Rehab PT Goals Patient Stated Goal: Decreased headache PT Goal Formulation: With patient Time For Goal Achievement: 04/05/18 Potential to Achieve Goals: Fair Progress towards PT goals: Not progressing toward goals - comment(Unwilling to mobilize due to pain)  Frequency    Min 3X/week      PT Plan Current plan remains appropriate    Co-evaluation              AM-PAC PT "6 Clicks" Daily Activity  Outcome Measure  Difficulty turning over in bed (including adjusting bedclothes, sheets and blankets)?: A Little Difficulty moving from lying on back to sitting on the side of the bed? : A Little Difficulty sitting down on and standing up from a chair with arms (e.g., wheelchair, bedside commode, etc,.)?: A Little Help needed moving to and from a bed to  chair (including a wheelchair)?: A Little Help needed walking in hospital room?: A Little Help needed climbing 3-5 steps with a railing? : A Lot 6 Click Score: 17    End of Session Equipment Utilized During Treatment: Gait belt Activity Tolerance: Patient limited by pain Patient left: in chair;with call bell/phone within reach;with nursing/sitter in room Nurse Communication: Mobility status PT Visit Diagnosis: Unsteadiness on feet (R26.81)     Time: 1610-9604 PT Time Calculation (min) (ACUTE ONLY): 14 min  Charges:  $Therapeutic Exercise: 8-22 mins                    G Codes:      Ina Homes, PT, DPT Acute Rehab Services  Pager: [phone]  Malachy Chamber 03/30/2018, 5:55 PM

## 2018-03-30 NOTE — Progress Notes (Signed)
Occupational Therapy Treatment Patient Details Name: [name] MRN: [id number] DOB: [birth year] Today's Date: 03/30/2018    History of present illness 57 year old male with PMH as below, which is significant for MS, Chronic pain, Depression, DVT, and gait abnormality.Pt admitted for minimally responsiveness due pt taking 15 tabs of Perfocet 10-325. Denies SI.   OT comments  Pt donned socks in bed then agreeable to mobilize to chair for lunch. Pt appears agitated at times but cooperative with therapist. Will continue to follow acutely to address established goals.   Follow Up Recommendations  Other (comment);Supervision/Assistance - 24 hour(behavioral health)    Equipment Recommendations  Other (comment)(TBA)    Recommendations for Other Services      Precautions / Restrictions Precautions Precautions: Fall Precaution Comments: sitter due to possible suicide attempt       Mobility Bed Mobility Overal bed mobility: Needs Assistance Bed Mobility: Supine to Sit     Supine to sit: Supervision        Transfers Overall transfer level: Needs assistance Equipment used: Rolling walker (2 wheeled) Transfers: Sit to/from [employer] Sit to Stand: Min guard Stand pivot transfers: Min assist       General transfer comment: dependent on support of bed on lower legs when standing    Balance Overall balance assessment: Needs assistance   Sitting balance-Leahy Scale: Good       Standing balance-Leahy Scale: Poor                             ADL either performed or assessed with clinical judgement   ADL Overall ADL's : Needs assistance/impaired                     Lower Body Dressing: Set up;Bed level Lower Body Dressing Details (indicate cue type and reason): donned socks in bed             Functional mobility during ADLs: Minimal assistance;Rolling walker;Cueing for safety(uncontroled descent to chair)       Vision        Perception     Praxis      Cognition Arousal/Alertness: Awake/alert Behavior During Therapy: Impulsive;Agitated Overall Cognitive Status: No family/caregiver present to determine baseline cognitive functioning                                 General Comments: 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 States today that he needs to go to his colonoscopy appointment adn that he no longer wants to play any "headgames". Cooperative throughout session.        Exercises     Shoulder Instructions       General Comments      Pertinent Vitals/ Pain       Pain Assessment: Faces Faces Pain Scale: Hurts a little bit Pain Location: bilat LE Pain Descriptors / Indicators: Aching Pain Intervention(s): Limited activity within patient's tolerance  Home Living                                          Prior Functioning/Environment              Frequency  Min 2X/week        Progress Toward Goals  OT Goals(current goals can now be found in the care plan section)  Progress towards OT goals: Progressing toward goals  Acute Rehab OT Goals Patient Stated Goal: to not play headgames OT Goal Formulation: With patient Time For Goal Achievement: 04/05/18 Potential to Achieve Goals: Good ADL Goals Pt Will Perform Grooming: with set-up;sitting Pt Will Perform Upper Body Bathing: with set-up;sitting Pt Will Perform Upper Body Dressing: with set-up;sitting Additional ADL Goal #1: Pt will perform bed mobility with minguard assist as precursor to EOB/OOB ADL completion. Additional ADL Goal #2: Pt will demonstrate alternating attention during ADL/functional task completion.  Plan Discharge plan remains appropriate    Co-evaluation                 AM-PAC PT "6 Clicks" Daily Activity     Outcome Measure   Help from another person eating meals?: None Help from another person taking care of personal grooming?: A Little Help  from another person toileting, which includes using toliet, bedpan, or urinal?: A Little Help from another person bathing (including washing, rinsing, drying)?: A Little Help from another person to put on and taking off regular upper body clothing?: A Little Help from another person to put on and taking off regular lower body clothing?: A Lot 6 Click Score: 18    End of Session Equipment Utilized During Treatment: Gait belt  OT Visit Diagnosis: Muscle weakness (generalized) (M62.81);Other symptoms and signs involving cognitive function;Other abnormalities of gait and mobility (R26.89)   Activity Tolerance Patient tolerated treatment well   Patient Left in chair;with call bell/phone within reach;with nursing/sitter in room   Nurse Communication Mobility status        Time: 1610-9604 OT Time Calculation (min): 13 min  Charges: OT General Charges $OT Visit: 1 Visit OT Treatments $Self Care/Home Management : 8-22 mins  Luisa Dago, OT/L  OT Clinical Specialist [phone]    [facility] 03/30/2018, 12:56 PM

## 2018-03-31 DIAGNOSIS — F332 Major depressive disorder, recurrent severe without psychotic features: Secondary | ICD-10-CM

## 2018-03-31 LAB — BASIC METABOLIC PANEL
ANION GAP: 6 (ref 5–15)
BUN: 11 mg/dL (ref 6–20)
CO2: 28 mmol/L (ref 22–32)
Calcium: 9.4 mg/dL (ref 8.9–10.3)
Chloride: 107 mmol/L (ref 101–111)
Creatinine, Ser: 0.86 mg/dL (ref 0.61–1.24)
Glucose, Bld: 99 mg/dL (ref 65–99)
Potassium: 3.9 mmol/L (ref 3.5–5.1)
SODIUM: 141 mmol/L (ref 135–145)

## 2018-03-31 MED ORDER — ACETAMINOPHEN 325 MG PO TABS
325.0000 mg | ORAL_TABLET | Freq: Once | ORAL | Status: AC
  Administered 2018-03-31: 325 mg via ORAL
  Filled 2018-03-31: qty 1

## 2018-03-31 MED ORDER — OXYCODONE-ACETAMINOPHEN 5-325 MG PO TABS
1.0000 | ORAL_TABLET | Freq: Three times a day (TID) | ORAL | Status: DC | PRN
  Administered 2018-03-31: 1 via ORAL
  Administered 2018-04-01 – 2018-04-05 (×12): 2 via ORAL
  Administered 2018-04-05: 1 via ORAL
  Administered 2018-04-06 – 2018-04-09 (×9): 2 via ORAL
  Filled 2018-03-31 (×25): qty 2

## 2018-03-31 NOTE — Consult Note (Addendum)
[name] - [facility] Psych Consult Progress Note  03/31/2018 1:29 PM [name]  MRN:  [id number] Subjective:   [name] was admitted with Percocet overdose. He ingested 30-40 Percocet 10-325 mg tablets. He required Narcan en route and Tylenol level was 136 on admission. He was recommended for inpatient psychiatric hospitalization.   Psych consult paced because the patient wanted to talk to psych again.  He tried to convince this Probation officer that he is fine and does not need a psych transfer.  Did not argue with the patient but listened.  He did not ask if the decision has been changed during the assessment.  The plan is still for him to go to geriatric psych after discharge to continue his psychiatric care.  He is too high of a risk to discharge home based on the seriousness of his overdose.  Dr [name], psychiatrist, has reviewed this patient and concurs with the plan    Principal Problem: Major depressive disorder, recurrent episode ([place]) Diagnosis:   Patient Active Problem List   Diagnosis Date Noted  . Major depressive disorder, recurrent episode ([place]) [F33.9] 12/19/2016    Priority: High  . Suicide attempt ([place]) [T14.91XA]   . Overdose by acetaminophen, accidental or unintentional, initial encounter [T39.1X1A] 03/18/2018  . Intentional acetaminophen overdose ([place]) [T39.1X2A]   . Severe major depression, single episode, with psychotic features ([place]) [F32.3] 10/04/2017  . Weakness [R53.1]   . Cellulitis of right leg [L03.115] 05/07/2017  . Right carpal tunnel syndrome [G56.01] 02/16/2017  . Gait disturbance [R26.9] 01/22/2017  . Insomnia [G47.00] 01/22/2017  . Depression with anxiety [F41.8] 01/22/2017  . High risk medication use [Z79.899] 01/22/2017  . Suicidal ideation [R45.851]   . AKI (acute kidney injury) ([place]) [N17.9] 11/15/2016  . Chronic hepatitis C virus infection ([place]) [B18.2] 11/15/2016  . Chronic abdominal pain [R10.9, G89.29] 11/15/2016  . Chronic pain syndrome [G89.4] 11/15/2016   . Acute retention of urine [R33.8] 11/15/2016  . Generalized abdominal pain [R10.84]   . Chronic cluster headache, not intractable [G44.029]   . Community acquired pneumonia [J18.9]   . 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 HCAP (healthcare-associated pneumonia) [J18.9] 02/16/2016  . Hypokalemia [E87.6] 02/16/2016  . Intractable cluster headache syndrome [G44.001]   . DVT (deep venous thrombosis) ([place]) [I82.409]   . Depression [F32.9]   . GERD (gastroesophageal reflux disease) [K21.9]   . HTN (hypertension) [I10]   . Essential hypertension [I10]   . Gastroesophageal reflux disease without esophagitis [K21.9]   . CAP (community acquired pneumonia) [J18.9]   . Multiple sclerosis ([place]) [G35] 08/02/2013  . ABDOMINAL BLOATING [R14.3, R14.1, R14.2] 10/14/2010  . LOOSE STOOLS [R19.7] 10/14/2010  . PULMONARY EMBOLISM, HX OF [Z86.718] 10/14/2010   Total Time spent with patient: 15 minutes  Past Psychiatric History: MDD with psychosis and anxiety.   Past Medical History:  Past Medical History:  Diagnosis Date  . Abdominal pain, unspecified site   . Anxiety   . Arthritis   . Benign paroxysmal positional vertigo   . Cellulitis and abscess of right leg 04/2017  . Chronic back pain   . Chronic pain syndrome 01/25/2008  . Cluster headache   . Depression    takes Zoloft daily  . DVT (deep venous thrombosis) (HCC)    in the left arm '09  . Gait abnormality    "uses mobile wheelchair, but is ambulatory"  . Gallstones 02/17/2009   resolved after gallbladder surgery.  [name] GERD (gastroesophageal reflux disease)    takes Omeprazole as needed  .  Headache(784.0)    cluster headaches frequently-takes Topamax daily  . History of colonoscopy   . HTN (hypertension)    takes Lisinopril,Verapamil,and Triamterene HCTZ daily  . Insomnia 11/06/2008  . Joint pain   . Joint swelling    03-07-16 "swelling of right wrist" "after a fall-xray done 03-06-16 "no fractures".  . Memory loss    no an issue at present  03-07-16  . Multiple sclerosis ([place])    Dx. 2005 - Dr. Dellis Filbert follows [place] 4'17 tx. Tysabri monthly IV-[facility] , [place].  [name] Nonspecific elevation of levels of transaminase or lactic acid dehydrogenase (LDH)   . Other specified visual disturbances   . Other syndromes affecting cervical region   . Pneumonia 2009  . Trigeminal neuralgia     history" Multiple sclerosis"    Past Surgical History:  Procedure Laterality Date  . CHOLECYSTECTOMY  02/20/2009  . COLONOSCOPY WITH PROPOFOL N/A 03/17/2016   Procedure: COLONOSCOPY WITH PROPOFOL;  Surgeon: Laurence Spates, MD;  Location: [facility];  Service: Endoscopy;  Laterality: N/A;  . PORT A CATH REVISION N/A 07/06/2015   Procedure: Removal and replacement of PORT A CATH;  Surgeon: Fanny Skates, MD;  Location: [facility];  Service: General;  Laterality: N/A;  . PORTACATH PLACEMENT N/A 03/27/2014   Procedure: INSERTION PORT-A-CATH;  Surgeon: Adin Hector, MD;  Location: [facility];  Service: General;  Laterality: N/A;   Family History:  Family History  Problem Relation Age of Onset  . Cancer Father   . Diabetes Mother    Family Psychiatric  History: Denies  Social History:  Social History   Substance and Sexual Activity  Alcohol Use Yes  . Alcohol/week: 0.0 oz   Comment: occasional     Social History   Substance and Sexual Activity  Drug Use No   Comment: Quit 2011    Social History   Socioeconomic History  . Marital status: Divorced    Spouse name: Not on file  . Number of children: 3  . Years of education: 11 th  . Highest education level: Not on file  Occupational History    Comment: Disabled  Social Needs  . Financial resource strain: Not on file  . Food insecurity:    Worry: Not on file    Inability: Not on file  . Transportation needs:    Medical: Not on file    Non-medical: Not on file  Tobacco Use  . Smoking status: Former Smoker    Packs/day: 1.00    Years: 38.00    Pack years: 38.00    Types:  Cigarettes  . Smokeless tobacco: Never Used  . Tobacco comment: cutting back  Substance and Sexual Activity  . Alcohol use: Yes    Alcohol/week: 0.0 oz    Comment: occasional  . Drug use: No    Comment: Quit 2011  . Sexual activity: Not on file  Lifestyle  . Physical activity:    Days per week: Not on file    Minutes per session: Not on file  . Stress: Not on file  Relationships  . Social connections:    Talks on phone: Not on file    Gets together: Not on file    Attends religious service: Not on file    Active member of club or organization: Not on file    Attends meetings of clubs or organizations: Not on file    Relationship status: Not on file  Other Topics Concern  . Not on file  Social History Narrative   Patient is divorced. Patient is disabled. Because of his MS. Patient has 11 th grade education. 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    Right handed.   Caffeine- One daily.    Sleep: Good  Appetite:  Good  Current Medications: Current Facility-Administered Medications  Medication Dose Route Frequency Provider Last Rate Last Dose  . baclofen (LIORESAL) tablet 20 mg  20 mg Oral TID Rai, Ripudeep K, MD   20 mg at 03/31/18 1001  . gabapentin (NEURONTIN) tablet 600 mg  600 mg Oral TID Rai, Ripudeep K, MD   600 mg at 03/31/18 1000  . hydrALAZINE (APRESOLINE) injection 10 mg  10 mg Intravenous Q4H PRN Whiteheart, Kathryn A, NP      . hydrOXYzine (ATARAX/VISTARIL) tablet 25 mg  25 mg Oral TID PRN Rai, Ripudeep K, MD      . levETIRAcetam (KEPPRA) tablet 500 mg  500 mg Oral BID Rai, Ripudeep K, MD   500 mg at 03/31/18 1000  . ondansetron (ZOFRAN) injection 4 mg  4 mg Intravenous Q6H PRN Frederik Pear, MD   4 mg at 03/19/18 1403  . Oxcarbazepine (TRILEPTAL) tablet 300 mg  300 mg Oral TID Darlina Sicilian A, NP   300 mg at 03/31/18 1000  . oxyCODONE-acetaminophen (PERCOCET/ROXICET) 5-325 MG per tablet 2 tablet  2 tablet Oral Q8H PRN  Domenic Polite, MD   2 tablet at 03/31/18 0418  . pantoprazole (PROTONIX) EC tablet 40 mg  40 mg Oral Q0600 Rai, Ripudeep K, MD   40 mg at 03/31/18 0546  . risperiDONE (RISPERDAL) tablet 0.5 mg  0.5 mg Oral BID Faythe Dingwall, DO   0.5 mg at 03/31/18 1001  . rivaroxaban (XARELTO) tablet 10 mg  10 mg Oral QPM Whiteheart, Kathryn A, NP   10 mg at 03/30/18 1648  . sertraline (ZOLOFT) tablet 50 mg  50 mg Oral Daily Whiteheart, Kathryn A, NP   50 mg at 03/31/18 1000  . sodium chloride flush (NS) 0.9 % injection 10-40 mL  10-40 mL Intracatheter PRN Scatliffe, Rise Paganini, MD   10 mL at 03/29/18 2120  . SUMAtriptan (IMITREX) tablet 100 mg  100 mg Oral Q2H PRN Rai, Ripudeep K, MD   100 mg at 03/31/18 1001  . topiramate (TOPAMAX) tablet 100 mg  100 mg Oral QHS Rai, Ripudeep K, MD   100 mg at 03/30/18 2143  . topiramate (TOPAMAX) tablet 50 mg  50 mg Oral q morning - 10a Domenic Polite, MD   50 mg at 03/31/18 1000  . traZODone (DESYREL) tablet 50 mg  50 mg Oral QHS PRN Rai, Ripudeep K, MD   50 mg at 03/25/18 2139  . triamterene-hydrochlorothiazide (MAXZIDE-25) 37.5-25 MG per tablet 1 tablet  1 tablet Oral Daily Scatliffe, Rise Paganini, MD   1 tablet at 03/31/18 1001   Facility-Administered Medications Ordered in Other Encounters  Medication Dose Route Frequency Provider Last Rate Last Dose  . sodium chloride 0.9 % injection 10 mL  10 mL Intracatheter PRN Evlyn Kanner, NP   10 mL at 11/07/15 1012    Lab Results:  Results for orders placed or performed during the hospital encounter of 03/17/18 (from the past 48 hour(s))  Basic metabolic panel     Status: None   Collection Time: 03/31/18  8:48 AM  Result Value Ref Range   Sodium 141 135 - 145 mmol/L   Potassium 3.9 3.5 - 5.1  mmol/L   Chloride 107 101 - 111 mmol/L   CO2 28 22 - 32 mmol/L   Glucose, Bld 99 65 - 99 mg/dL   BUN 11 6 - 20 mg/dL   Creatinine, Ser 0.86 0.61 - 1.24 mg/dL   Calcium 9.4 8.9 - 10.3 mg/dL   GFR calc non Af Amer >60 >60  mL/min   GFR calc Af Amer >60 >60 mL/min    Comment: (NOTE) The eGFR has been calculated using the CKD EPI equation. This calculation has not been validated in all clinical situations. eGFR's persistently <60 mL/min signify possible Chronic Kidney Disease.    Anion gap 6 5 - 15    Comment: Performed at [facility] [address], [place], [place] [address]    Blood Alcohol level:  Lab Results  Component Value Date   [facility] <5 12/17/2016   ETH <5 08/23/2015   Musculoskeletal: Strength & Muscle Tone: within normal limits Gait & Station: UTA since patient was lying in bed. Patient leans: N/A  Psychiatric Specialty Exam: Physical Exam  Nursing note and vitals reviewed. Constitutional: He is oriented to person, place, and time. He appears well-developed and well-nourished.  HENT:  Head: Normocephalic and atraumatic.  Neck: Normal range of motion.  Respiratory: Effort normal.  Musculoskeletal: Normal range of motion.  Neurological: He is alert and oriented to person, place, and time.  Skin: No rash noted.  Psychiatric: His speech is normal and behavior is normal. Thought content normal. His mood appears anxious. Cognition and memory are normal. He expresses impulsivity.    Review of Systems  Constitutional: Negative for chills and fever.  Cardiovascular: Negative for chest pain.  Gastrointestinal: Negative for abdominal pain, constipation, diarrhea, nausea and vomiting.  Psychiatric/Behavioral: Negative for depression, hallucinations, substance abuse and suicidal ideas. The patient is nervous/anxious. The patient does not have insomnia.   All other systems reviewed and are negative.   Blood pressure 114/74, pulse 77, temperature 99.6 F (37.6 C), temperature source Oral, resp. rate 18, height '5\' 9"'$  (1.753 m), weight 78.7 kg (173 lb 8 oz), SpO2 95 %.Body mass index is 25.62 kg/m.  General Appearance: Fairly Groomed, middle aged, African American male, wearing a hospital  gown with facial hair, balding head and body tattoos who is lying in bed. NAD.   Eye Contact:  Good  Speech:  Clear and Coherent and Normal Rate  Volume:  Normal  Mood:  Anxious   Affect:  Congruent  Thought Process:  Goal Directed, Linear and Descriptions of Associations: Intact  Orientation:  Full (Time, Place, and Person)  Thought Content:  Logical  Suicidal Thoughts:  No  Homicidal Thoughts:  No  Memory:  Immediate;   Good Recent;   Good Remote;   Good  Judgement:  Poor  Insight:  Shallow  Psychomotor Activity:  Normal  Concentration:  Concentration: Good and Attention Span: Good  Recall:  Good  Fund of Knowledge:  Fair  Language:  Fair  Akathisia:  No  Handed:  Right  AIMS (if indicated):   N/A  Assets:  Housing Social Support  ADL's:  Intact  Cognition:  WDL  Sleep:   Okay   Assessment:  [name] is a 57 y.o. male who was admitted with Percocet overdose. He continues to deny intentionally trying to harm self but does not appear to be forthcoming with information and is inconsistent with reports of how he accidentally ingested the medication. His mother reports that he has been depressed and she  believes hat he intended to harm himself. He continues to warrant inpatient psychiatric hospitalization for stabilization and treatment.   Treatment Plan Summary: -Patient warrants inpatient psychiatric hospitalization given high risk of harm to self. -Continue Engineer, materials.  -Continue home medications: Zoloft 50 mg daily for depression/anxiety, Atarax 25 mg TID PRN for anxiety and Trazodone 50 mg qhs PRN for insomnia.  -Continue Risperdal 0.5 mg BID for psychosis.  -Please pursue involuntary commitment if patient refuses voluntary psychiatric hospitalization or attempts to leave the hospital.  -Will sign off on patient at this time. Please consult psychiatry again as needed.   Waylan Boga, NP 03/31/2018, 1:29 PM

## 2018-03-31 NOTE — Progress Notes (Signed)
Pt complaining of left side pain underneath ribcage  pain pt stated "it feels like knot" painful to palpation Dr Sunnie Nielsen notified one time dose of tylenol ordered

## 2018-03-31 NOTE — Progress Notes (Signed)
Patient continues to be on [address].   [place] Marian Meneely LCSW [phone]

## 2018-03-31 NOTE — Progress Notes (Signed)
PROGRESS NOTE    [name]  ONG:[id number] DOB: [birth year] DOA: 03/17/2018 PCP: Fleet Contras, MD   Brief Narrative: Admitted by CCM to ICU on 68/58 57 year old male with PMH as below, which is significant for MS, Chronic pain, Depression, DVT, and gait abnormality. His family noted him to be minimally responsive on the evening hours of 5/8 and called EMS. Upon their arrival the patient had agonal respirations, remained somnolent upon arrival to ED and was given IV narcan. He responded very well and was started on infusion. Patient reported taking 15 tablets of Percocet 10-325. He claims overdose was accidental and he had no intention of harming himself.  PCCM admitted for Narcan infusion, also given NAC due to elevated tylenol elvel -Improved Transferred to [facility], Psych eval completed, now stable awaiting inpatient Psych bed     Assessment & Plan:   Principal Problem:   Major depressive disorder, recurrent episode (HCC) Active Problems:   PULMONARY EMBOLISM, HX OF   Multiple sclerosis (HCC)   DVT (deep venous thrombosis) (HCC)   GERD (gastroesophageal reflux disease)   Essential hypertension   Chronic pain syndrome   Overdose by acetaminophen, accidental or unintentional, initial encounter   Suicide attempt ([facility])  Overdose by acetaminophen, opioids,  initial encounter Patient had arrived to ED with minimal responsiveness and agonal respirations.   -patient had reported taking 15 tablets of Percocet, denies SI Admitted to ICU by CCM, started on narcan infusion, mentation improved.  -tylenol at 138 on admission, treated with NAC. Tylenol level less than 10 subsequently.  -Psych consulted, who recommended inpatient psychiatric facility admission.   History of DVT and PE;  On prophylaxis dose xarelto since 2018. Will defer to PCP doses.   MS;  - MRI of the brain and C-spine negative for new enhancements or lesions.  Dr.Rai d/w NEuro Dr.Arora on 5/13, No need for IV  steroids Wheelchair bound  - Placed on baclofen 20 mg 3 times daily, he also receives Ocrevus infusion every 6 months, followed by neurology outpatient Dr. Tinnie Gens. -Continue Keppra, Trileptal,  GERD; PPI   Right shoulder pain, chronic pain syndrome X ray was negative for fracture.   Bilateral PNA;  Chest x ray showed multilobar PNA.  He was treated with ceftriaxone and azithromycin.   Migraine headaches Placed back on Topamax, Imitrex as     DVT prophylaxis: Xarelto Code Status: full code.  Family Communication: care discussed with patient.  Disposition Plan: awaiting psychiatric facility   Consultants:   CCM  Psych    Procedures:  MRI brain C-spine      Antimicrobials:   Completed course of ceftriaxone and azithromycin.      Subjective: He is alert, sitting in bed. Wants to apeak with psych.  denies dyspnea.   Objective: Vitals:   03/30/18 2159 03/31/18 0500 03/31/18 0516 03/31/18 1327  BP: 111/74  115/80 114/74  Pulse: 73  78 77  Resp: 18  18 18   Temp: 97.8 F (36.6 C)  97.9 F (36.6 C) 99.6 F (37.6 C)  TempSrc:   Oral Oral  SpO2: 96%  97% 95%  Weight:  78.7 kg (173 lb 8 oz)    Height:        Intake/Output Summary (Last 24 hours) at 03/31/2018 1408 Last data filed at 03/31/2018 1305 Gross per 24 hour  Intake 340 ml  Output 3150 ml  Net -2810 ml   Filed Weights   03/29/18 0531 03/30/18 0500 03/31/18 0500  Weight: 78.6 kg (173  lb 4.5 oz) 78.7 kg (173 lb 8 oz) 78.7 kg (173 lb 8 oz)    Examination:  General exam: Appears calm and comfortable  Respiratory system: Clear to auscultation. Respiratory effort normal. Cardiovascular system: S1 & S2 heard, RRR. No JVD, murmurs, rubs, gallops or clicks. No pedal edema. Gastrointestinal system: Abdomen is nondistended, soft and nontender. No organomegaly or masses felt. Normal bowel sounds heard. Central nervous system: Alert and oriented. No focal neurological deficits. Extremities: Symmetric  5 x 5 power. Skin: No rashes, lesions or ulcers    Data Reviewed: I have personally reviewed following labs and imaging studies  CBC: No results for input(s): WBC, NEUTROABS, HGB, HCT, MCV, PLT in the last 168 hours. Basic Metabolic Panel: Recent Labs  Lab 03/31/18 0848  NA 141  K 3.9  CL 107  CO2 28  GLUCOSE 99  BUN 11  CREATININE 0.86  CALCIUM 9.4   GFR: Estimated Creatinine Clearance: 94.8 mL/min (by C-G formula based on SCr of 0.86 mg/dL). Liver Function Tests: No results for input(s): AST, ALT, ALKPHOS, BILITOT, PROT, ALBUMIN in the last 168 hours. No results for input(s): LIPASE, AMYLASE in the last 168 hours. No results for input(s): AMMONIA in the last 168 hours. Coagulation Profile: No results for input(s): INR, PROTIME in the last 168 hours. Cardiac Enzymes: No results for input(s): CKTOTAL, CKMB, CKMBINDEX, TROPONINI in the last 168 hours. BNP (last 3 results) No results for input(s): PROBNP in the last 8760 hours. HbA1C: No results for input(s): HGBA1C in the last 72 hours. CBG: Recent Labs  Lab 03/28/18 1227 03/28/18 1608 03/28/18 2023 03/29/18 0037 03/29/18 0527  GLUCAP 96 129* 118* 112* 112*   Lipid Profile: No results for input(s): CHOL, HDL, LDLCALC, TRIG, CHOLHDL, LDLDIRECT in the last 72 hours. Thyroid Function Tests: No results for input(s): TSH, T4TOTAL, FREET4, T3FREE, THYROIDAB in the last 72 hours. Anemia Panel: No results for input(s): VITAMINB12, FOLATE, FERRITIN, TIBC, IRON, RETICCTPCT in the last 72 hours. Sepsis Labs: No results for input(s): PROCALCITON, LATICACIDVEN in the last 168 hours.  No results found for this or any previous visit (from the past 240 hour(s)).       Radiology Studies: No results found.      Scheduled Meds: . baclofen  20 mg Oral TID  . gabapentin  600 mg Oral TID  . levETIRAcetam  500 mg Oral BID  . Oxcarbazepine  300 mg Oral TID  . pantoprazole  40 mg Oral Q0600  . risperiDONE  0.5 mg Oral  BID  . rivaroxaban  10 mg Oral QPM  . sertraline  50 mg Oral Daily  . topiramate  100 mg Oral QHS  . topiramate  50 mg Oral q morning - 10a  . triamterene-hydrochlorothiazide  1 tablet Oral Daily   Continuous Infusions:   LOS: 13 days    Time spent: 35 minutes.     Alba Cory, MD [facility] Pager [phone]  If 7PM-7AM, please contact night-coverage [email or web address] Password [facility] 03/31/2018, 2:08 PM

## 2018-04-01 LAB — HEPATIC FUNCTION PANEL
ALBUMIN: 3.1 g/dL — AB (ref 3.5–5.0)
ALK PHOS: 91 U/L (ref 38–126)
ALT: 45 U/L (ref 17–63)
AST: 19 U/L (ref 15–41)
BILIRUBIN TOTAL: 0.1 mg/dL — AB (ref 0.3–1.2)
Bilirubin, Direct: <0.1 mg/dL — ABNORMAL LOW (ref 0.1–0.5)
TOTAL PROTEIN: 6.5 g/dL (ref 6.5–8.1)

## 2018-04-01 LAB — CBC
HCT: 38.6 % — ABNORMAL LOW (ref 39.0–52.0)
HEMOGLOBIN: 11.8 g/dL — AB (ref 13.0–17.0)
MCH: 27 pg (ref 26.0–34.0)
MCHC: 30.6 g/dL (ref 30.0–36.0)
MCV: 88.3 fL (ref 78.0–100.0)
Platelets: 548 10*3/uL — ABNORMAL HIGH (ref 150–400)
RBC: 4.37 MIL/uL (ref 4.22–5.81)
RDW: 14.4 % (ref 11.5–15.5)
WBC: 6 10*3/uL (ref 4.0–10.5)

## 2018-04-01 LAB — LIPASE, BLOOD: Lipase: 44 U/L (ref 11–51)

## 2018-04-01 MED ORDER — POLYVINYL ALCOHOL 1.4 % OP SOLN
1.0000 [drp] | Freq: Four times a day (QID) | OPHTHALMIC | Status: DC | PRN
  Administered 2018-04-01 – 2018-04-03 (×3): 2 [drp] via OPHTHALMIC
  Filled 2018-04-01: qty 15

## 2018-04-01 MED ORDER — SUCRALFATE 1 GM/10ML PO SUSP
1.0000 g | Freq: Three times a day (TID) | ORAL | Status: DC
  Administered 2018-04-01 – 2018-04-09 (×32): 1 g via ORAL
  Filled 2018-04-01 (×30): qty 10

## 2018-04-01 MED ORDER — NAPHAZOLINE-GLYCERIN 0.012-0.2 % OP SOLN
1.0000 [drp] | Freq: Four times a day (QID) | OPHTHALMIC | Status: DC | PRN
  Filled 2018-04-01: qty 15

## 2018-04-01 NOTE — Progress Notes (Signed)
Physical Therapy Treatment Patient Details Name: [name] MRN: [id number] DOB: 07-Aug-1961 Today's Date: 04/01/2018    History of Present Illness Pt is a 57 y.o. male with PMH significant for MS, chronic pain, depression, DVT, and gait abnormality, admitted 03/17/18 after found minimally responsive with acetaminophen overdose; denies SI.   PT Comments    Pt agreeable to ambulation in room this session. Able to amb short distance with RW and min guard for balance; due to RLE deficits, heavy reliance on BUE/LLE support with RLE externally rotated and extended behind pt, pt able to compensate with slight R hip flexion to translate leg forward. Pt declining sitting in recliner secondary to it being uncomfortable.   Follow Up Recommendations  (IP psych facility)     Equipment Recommendations  None recommended by PT    Recommendations for Other Services       Precautions / Restrictions Precautions Precautions: Fall Precaution Comments: Suicide sitter Restrictions Weight Bearing Restrictions: No    Mobility  Bed Mobility Overal bed mobility: Needs Assistance Bed Mobility: Supine to Sit;Sit to Supine     Supine to sit: Supervision Sit to supine: Supervision   General bed mobility comments: Pt with good ability to use BUEs to maneuver BLEs out of bed  Transfers Overall transfer level: Needs assistance Equipment used: Rolling walker (2 wheeled) Transfers: Sit to/from Stand Sit to Stand: Min guard            Ambulation/Gait Ambulation/Gait assistance: Min guard Ambulation Distance (Feet): 15 Feet Assistive device: Rolling walker (2 wheeled) Gait Pattern/deviations: Step-to pattern;Decreased weight shift to right;Antalgic Gait velocity: Decreased Gait velocity interpretation: <1.31 ft/sec, indicative of household ambulator General Gait Details: Slow ambulation with RW, heavy reliance on BUE/LLE support with RLE externally rotated and dragging behind pt; pt able to  somewhat translate RLE forward with hip flexion. Min guard for safety, but no physical assist required   Stairs             Wheelchair Mobility    Modified Rankin (Stroke Patients Only)       Balance Overall balance assessment: Needs assistance Sitting-balance support: Feet supported Sitting balance-Leahy Scale: Good Sitting balance - Comments: Indep to don socks sitting EOB   Standing balance support: Bilateral upper extremity supported Standing balance-Leahy Scale: Poor Standing balance comment: Reliant on UE support                            Cognition Arousal/Alertness: Awake/alert Behavior During Therapy: Flat affect Overall Cognitive Status: No family/caregiver present to determine baseline cognitive functioning Area of Impairment: Safety/judgement;Attention;Following commands                   Current Attention Level: Selective   Following Commands: Follows multi-step commands inconsistently Safety/Judgement: Decreased awareness of safety;Decreased awareness of deficits            Exercises      General Comments        Pertinent Vitals/Pain Pain Assessment: Faces Faces Pain Scale: Hurts little more Pain Location: R shoulder Pain Descriptors / Indicators: Discomfort;Sore Pain Intervention(s): Monitored during session    Home Living                      Prior Function            PT Goals (current goals can now be found in the care plan section) Acute Rehab PT Goals Patient Stated Goal: Decreased  shoulder pain PT Goal Formulation: With patient Time For Goal Achievement: 04/05/18 Potential to Achieve Goals: Fair Progress towards PT goals: Progressing toward goals    Frequency    Min 2X/week      PT Plan Frequency needs to be updated    Co-evaluation              AM-PAC PT "6 Clicks" Daily Activity  Outcome Measure  Difficulty turning over in bed (including adjusting bedclothes, sheets and  blankets)?: None Difficulty moving from lying on back to sitting on the side of the bed? : None Difficulty sitting down on and standing up from a chair with arms (e.g., wheelchair, bedside commode, etc,.)?: A Little Help needed moving to and from a bed to chair (including a wheelchair)?: A Little Help needed walking in hospital room?: A Little Help needed climbing 3-5 steps with a railing? : A Lot 6 Click Score: 19    End of Session Equipment Utilized During Treatment: Gait belt Activity Tolerance: Patient tolerated treatment well Patient left: in bed;with call bell/phone within reach;with nursing/sitter in room Nurse Communication: Mobility status PT Visit Diagnosis: Unsteadiness on feet (R26.81)     Time: 1610-9604 PT Time Calculation (min) (ACUTE ONLY): 15 min  Charges:  $Gait Training: 8-22 mins                    G Codes:      Ina Homes, PT, DPT Acute Rehab Services  Pager: [phone]  Malachy Chamber 04/01/2018, 5:31 PM

## 2018-04-01 NOTE — Progress Notes (Signed)
PROGRESS NOTE    [name]  OZH:[id number] DOB: 01/31/1961 DOA: 03/17/2018 PCP: Fleet Contras, MD   Brief Narrative: Admitted by CCM to ICU on 37/77 57 year old male with PMH as below, which is significant for MS, Chronic pain, Depression, DVT, and gait abnormality. His family noted him to be minimally responsive on the evening hours of 5/8 and called EMS. Upon their arrival the patient had agonal respirations, remained somnolent upon arrival to ED and was given IV narcan. He responded very well and was started on infusion. Patient reported taking 15 tablets of Percocet 10-325. He claims overdose was accidental and he had no intention of harming himself.  PCCM admitted for Narcan infusion, also given NAC due to elevated tylenol elvel -Improved Transferred to [facility], Psych eval completed, now stable awaiting inpatient Psych bed     Assessment & Plan:   Principal Problem:   Major depressive disorder, recurrent episode (HCC) Active Problems:   PULMONARY EMBOLISM, HX OF   Multiple sclerosis (HCC)   DVT (deep venous thrombosis) (HCC)   GERD (gastroesophageal reflux disease)   Essential hypertension   Chronic pain syndrome   Overdose by acetaminophen, accidental or unintentional, initial encounter   Suicide attempt (HCC)  Overdose by acetaminophen, opioids,  initial encounter Patient had arrived to ED with minimal responsiveness and agonal respirations.   -patient had reported taking 15 tablets of Percocet, denies SI Admitted to ICU by CCM, started on narcan infusion, mentation improved.  -tylenol at 138 on admission, treated with NAC. Tylenol level less than 10 subsequently.  -Psych consulted, who recommended inpatient psychiatric facility admission.   History of DVT and PE;  On prophylaxis dose xarelto since 2018. Will defer to PCP doses.   Abdominal Pain; chronic pain for years, since 2012. After he had sx.  Will check lipase, LFT, CBC  He is complaining of pain today. Start  Carafate   MS;  - MRI of the brain and C-spine negative for new enhancements or lesions.  Dr.Rai d/w [place]uro Dr.Arora on 5/13, No need for IV steroids Wheelchair bound  - Placed on baclofen 20 mg 3 times daily, he also receives Ocrevus infusion every 6 months, followed by neurology outpatient Dr. Tinnie Gens. -Continue Keppra, Trileptal.  GERD; PPI   Right shoulder pain, chronic pain syndrome X ray was negative for fracture.   Bilateral PNA;  Chest x ray showed multilobar PNA.  He was treated with ceftriaxone and azithromycin.   Migraine headaches Placed back on Topamax, Imitrex as     DVT prophylaxis: Xarelto Code Status: full code.  Family Communication: care discussed with patient.  Disposition Plan: awaiting psychiatric facility   Consultants:   CCM  Psych    Procedures:  MRI brain C-spine      Antimicrobials:   Completed course of ceftriaxone and azithromycin.      Subjective: He is complaining of abdominal pain, worse when he eats. He has had this pain for years, since he had sx.   Objective: Vitals:   03/31/18 0516 03/31/18 1327 03/31/18 2109 04/01/18 0455  BP: 115/80 114/74 99/70 109/81  Pulse: 78 77 91 71  Resp: 18 18 18 16   Temp: 97.9 F (36.6 C) 99.6 F (37.6 C) 98.3 F (36.8 C) (!) 97.5 F (36.4 C)  TempSrc: Oral Oral Oral Oral  SpO2: 97% 95% 95% 94%  Weight:      Height:        Intake/Output Summary (Last 24 hours) at 04/01/2018 1102 Last data filed at  03/31/2018 2100 Gross per 24 hour  Intake 700 ml  Output 1250 ml  Net -550 ml   Filed Weights   03/29/18 0531 03/30/18 0500 03/31/18 0500  Weight: 78.6 kg (173 lb 4.5 oz) 78.7 kg (173 lb 8 oz) 78.7 kg (173 lb 8 oz)    Examination:  General exam: NAD Respiratory system: CTA Cardiovascular system: S 1, S 2 RRR Gastrointestinal system: BS present, soft, Nt Central nervous system: alert.  Extremities: Symmetric power.  Skin: no rashes.     Data Reviewed: I have personally  reviewed following labs and imaging studies  CBC: No results for input(s): WBC, NEUTROABS, HGB, HCT, MCV, PLT in the last 168 hours. Basic Metabolic Panel: Recent Labs  Lab 03/31/18 0848  NA 141  K 3.9  CL 107  CO2 28  GLUCOSE 99  BUN 11  CREATININE 0.86  CALCIUM 9.4   GFR: Estimated Creatinine Clearance: 94.8 mL/min (by C-G formula based on SCr of 0.86 mg/dL). Liver Function Tests: No results for input(s): AST, ALT, ALKPHOS, BILITOT, PROT, ALBUMIN in the last 168 hours. No results for input(s): LIPASE, AMYLASE in the last 168 hours. No results for input(s): AMMONIA in the last 168 hours. Coagulation Profile: No results for input(s): INR, PROTIME in the last 168 hours. Cardiac Enzymes: No results for input(s): CKTOTAL, CKMB, CKMBINDEX, TROPONINI in the last 168 hours. BNP (last 3 results) No results for input(s): PROBNP in the last 8760 hours. HbA1C: No results for input(s): HGBA1C in the last 72 hours. CBG: Recent Labs  Lab 03/28/18 1227 03/28/18 1608 03/28/18 2023 03/29/18 0037 03/29/18 0527  GLUCAP 96 129* 118* 112* 112*   Lipid Profile: No results for input(s): CHOL, HDL, LDLCALC, TRIG, CHOLHDL, LDLDIRECT in the last 72 hours. Thyroid Function Tests: No results for input(s): TSH, T4TOTAL, FREET4, T3FREE, THYROIDAB in the last 72 hours. Anemia Panel: No results for input(s): VITAMINB12, FOLATE, FERRITIN, TIBC, IRON, RETICCTPCT in the last 72 hours. Sepsis Labs: No results for input(s): PROCALCITON, LATICACIDVEN in the last 168 hours.  No results found for this or any previous visit (from the past 240 hour(s)).       Radiology Studies: No results found.      Scheduled Meds: . baclofen  20 mg Oral TID  . gabapentin  600 mg Oral TID  . levETIRAcetam  500 mg Oral BID  . Oxcarbazepine  300 mg Oral TID  . pantoprazole  40 mg Oral Q0600  . risperiDONE  0.5 mg Oral BID  . rivaroxaban  10 mg Oral QPM  . sertraline  50 mg Oral Daily  . sucralfate  1 g  Oral TID WC & HS  . topiramate  100 mg Oral QHS  . topiramate  50 mg Oral q morning - 10a  . triamterene-hydrochlorothiazide  1 tablet Oral Daily   Continuous Infusions:   LOS: 14 days    Time spent: 35 minutes.     Alba Cory, MD [facility] Pager [phone]  If 7PM-7AM, please contact night-coverage [email or web address] Password [email or web address] 04/01/2018, 11:02 AM

## 2018-04-01 NOTE — Care Management Note (Addendum)
Case Management Note  Patient Details  Name: [name] MRN: [id number] Date of Birth: 07/28/1961  Subjective/Objective: Presented with   Major depressive disorder / Percocet overdose , hx of MS, Chronic pain, Depression, DVT, and gait abnormality. Resides with girlfriend [name] and mom. Owns walker,w/c, BSC. States independent with ADL's PTA.    PCP:  [name]      Action/Plan:  Transition to [facility]...CSW managing disposition to facility.  Expected Discharge Date:                  Expected Discharge Plan:  [facility]  In-House Referral:  Clinical Social Work  Discharge planning Services  CM Consult  Post Acute Care Choice:    Choice offered to:     DME Arranged:    DME Agency:     HH Arranged:    HH Agency:     Status of Service:  In process, will continue to follow  If discussed at Long Length of Stay Meetings, dates discussed:    Additional Comments:  Gary Lesches, RN 04/01/2018, 1:23 PM

## 2018-04-02 NOTE — Progress Notes (Signed)
PROGRESS NOTE    [name]  OYD:[id number] DOB: 1961-07-15 DOA: 03/17/2018 PCP: Fleet Contras, MD   Brief Narrative: Admitted by CCM to ICU on 31/7 57 year old male with PMH as below, which is significant for MS, Chronic pain, Depression, DVT, and gait abnormality. His family noted him to be minimally responsive on the evening hours of 5/8 and called EMS. Upon their arrival the patient had agonal respirations, remained somnolent upon arrival to ED and was given IV narcan. He responded very well and was started on infusion. Patient reported taking 15 tablets of Percocet 10-325. He claims overdose was accidental and he had no intention of harming himself.  PCCM admitted for Narcan infusion, also given NAC due to elevated tylenol elvel -Improved Transferred to [facility], Psych eval completed, now stable awaiting inpatient Psych bed     Assessment & Plan:   Principal Problem:   Suicide attempt [facility]) Active Problems:   PULMONARY EMBOLISM, HX OF   Multiple sclerosis (HCC)   DVT (deep venous thrombosis) (HCC)   GERD (gastroesophageal reflux disease)   Essential hypertension   Chronic pain syndrome   Major depressive disorder, recurrent episode (HCC)   Overdose by acetaminophen, accidental or unintentional, initial encounter  Overdose by acetaminophen, opioids,  initial encounter Patient had arrived to ED with minimal responsiveness and agonal respirations.   -patient had reported taking 15 tablets of Percocet, denies SI Admitted to ICU by CCM, started on narcan infusion, mentation improved.  -tylenol at 138 on admission, treated with NAC. Tylenol level less than 10 subsequently.  -Psych consulted, who recommended inpatient psychiatric facility admission.  Medical clear, awaiting psychiatric facility   History of DVT and PE;  On prophylaxis dose xarelto since 2018. Will defer to PCP doses.   Abdominal Pain; chronic pain for years, since 2012. After he had sx.  Lipase normal, LFT normal    Pain improved with Carafate.   MS;  - MRI of the brain and C-spine negative for new enhancements or lesions.  Dr.Rai d/w [place]uro Dr.Arora on 5/13, No need for IV steroids Wheelchair bound  - Placed on baclofen 20 mg 3 times daily, he also receives Ocrevus infusion every 6 months, followed by neurology outpatient Dr. Tinnie Gens. -Continue Keppra, Trileptal.  GERD; PPI   Right shoulder pain, chronic pain syndrome X ray was negative for fracture.   Bilateral PNA;  Chest x ray showed multilobar PNA.  He was treated with ceftriaxone and azithromycin.   Migraine headaches Placed back on Topamax, Imitrex as     DVT prophylaxis: Xarelto Code Status: full code.  Family Communication: care discussed with patient.  Disposition Plan: awaiting psychiatric facility   Consultants:   CCM  Psych    Procedures:  MRI brain C-spine      Antimicrobials:   Completed course of ceftriaxone and azithromycin.      Subjective: He report improvement of abdominal pain  Objective: Vitals:   04/01/18 1413 04/01/18 1816 04/01/18 2314 04/02/18 0557  BP: 107/73 105/76 116/81 122/83  Pulse: 71 80 77 69  Resp: 20 18 18 16   Temp: 98.4 F (36.9 C) (!) 97.4 F (36.3 C) 98.2 F (36.8 C) (!) 97.5 F (36.4 C)  TempSrc: Oral Oral Oral Oral  SpO2: 98% 95% 95% 94%  Weight:    78.3 kg (172 lb 9.9 oz)  Height:        Intake/Output Summary (Last 24 hours) at 04/02/2018 1246 Last data filed at 04/02/2018 1026 Gross per 24 hour  Intake  730 ml  Output 500 ml  Net 230 ml   Filed Weights   03/31/18 0500 04/01/18 1100 04/02/18 0557  Weight: 78.7 kg (173 lb 8 oz) 76.8 kg (169 lb 5 oz) 78.3 kg (172 lb 9.9 oz)    Examination:  General exam: NAD Respiratory system: CTA Cardiovascular system: S 1, S 2 RRR Gastrointestinal system: BS present, soft, nt Central nervous system: alert  Extremities: no edema.  Skin: No rash     Data Reviewed: I have personally reviewed following labs and  imaging studies  CBC: Recent Labs  Lab 04/01/18 1444  WBC 6.0  HGB 11.8*  HCT 38.6*  MCV 88.3  PLT 548*   Basic Metabolic Panel: Recent Labs  Lab 03/31/18 0848  NA 141  K 3.9  CL 107  CO2 28  GLUCOSE 99  BUN 11  CREATININE 0.86  CALCIUM 9.4   GFR: Estimated Creatinine Clearance: 94.8 mL/min (by C-G formula based on SCr of 0.86 mg/dL). Liver Function Tests: Recent Labs  Lab 04/01/18 1444  AST 19  ALT 45  ALKPHOS 91  BILITOT 0.1*  PROT 6.5  ALBUMIN 3.1*   Recent Labs  Lab 04/01/18 1444  LIPASE 44   No results for input(s): AMMONIA in the last 168 hours. Coagulation Profile: No results for input(s): INR, PROTIME in the last 168 hours. Cardiac Enzymes: No results for input(s): CKTOTAL, CKMB, CKMBINDEX, TROPONINI in the last 168 hours. BNP (last 3 results) No results for input(s): PROBNP in the last 8760 hours. HbA1C: No results for input(s): HGBA1C in the last 72 hours. CBG: Recent Labs  Lab 03/28/18 1227 03/28/18 1608 03/28/18 2023 03/29/18 0037 03/29/18 0527  GLUCAP 96 129* 118* 112* 112*   Lipid Profile: No results for input(s): CHOL, HDL, LDLCALC, TRIG, CHOLHDL, LDLDIRECT in the last 72 hours. Thyroid Function Tests: No results for input(s): TSH, T4TOTAL, FREET4, T3FREE, THYROIDAB in the last 72 hours. Anemia Panel: No results for input(s): VITAMINB12, FOLATE, FERRITIN, TIBC, IRON, RETICCTPCT in the last 72 hours. Sepsis Labs: No results for input(s): PROCALCITON, LATICACIDVEN in the last 168 hours.  No results found for this or any previous visit (from the past 240 hour(s)).       Radiology Studies: No results found.      Scheduled Meds: . baclofen  20 mg Oral TID  . gabapentin  600 mg Oral TID  . levETIRAcetam  500 mg Oral BID  . Oxcarbazepine  300 mg Oral TID  . pantoprazole  40 mg Oral Q0600  . risperiDONE  0.5 mg Oral BID  . rivaroxaban  10 mg Oral QPM  . sertraline  50 mg Oral Daily  . sucralfate  1 g Oral TID WC & HS   . topiramate  100 mg Oral QHS  . topiramate  50 mg Oral q morning - 10a  . triamterene-hydrochlorothiazide  1 tablet Oral Daily   Continuous Infusions:   LOS: 15 days    Time spent: 35 minutes.     Alba Cory, MD [facility] Pager [phone]  If 7PM-7AM, please contact night-coverage [email or web address] Password [facility] 04/02/2018, 12:46 PM

## 2018-04-02 NOTE — Progress Notes (Addendum)
10:15 CSW received Findings and Custody Orders from the Magistrate. CSW called [employer]. Informed [employer] that patient needs to be served paperwork. [employer] will be out as soon as possible. CSW placed IVC paperwork on chart.   9:31 PM: CSW received phone call from floor. Pt attempting to leave. Per notes from psychiatry on 5/22, "if pt tries to leave IVC him." Paperwork completed and faxed to [employer]   CSW will wait for the Findings and Custody Orders to be faxed back. Once received CSW will call Police to serve the papers.   Montine Circle, [facility]  [phone]

## 2018-04-02 NOTE — Care Management Important Message (Signed)
Important Message  Patient Details  Name: [name] MRN: [id number] Date of Birth: 08/11/1961   Medicare Important Message Given:  Yes    [name] P Grant Swager 04/02/2018, 3:34 PM

## 2018-04-03 LAB — GLUCOSE, CAPILLARY: Glucose-Capillary: 104 mg/dL — ABNORMAL HIGH (ref 65–99)

## 2018-04-03 NOTE — Progress Notes (Signed)
PROGRESS NOTE    [name]  UEA:[id number] DOB: 1961/04/26 DOA: 03/17/2018 PCP: Fleet Contras, MD   Brief Narrative: Admitted by CCM to ICU on 62/59 57 year old male with PMH as below, which is significant for MS, Chronic pain, Depression, DVT, and gait abnormality. His family noted him to be minimally responsive on the evening hours of 5/8 and called EMS. Upon their arrival the patient had agonal respirations, remained somnolent upon arrival to ED and was given IV narcan. He responded very well and was started on infusion. Patient reported taking 15 tablets of Percocet 10-325. He claims overdose was accidental and he had no intention of harming himself.  PCCM admitted for Narcan infusion, also given NAC due to elevated tylenol elvel -Improved Transferred to [facility], Psych eval completed, now stable awaiting inpatient Psych bed     Assessment & Plan:   Principal Problem:   Suicide attempt [facility]) Active Problems:   PULMONARY EMBOLISM, HX OF   Multiple sclerosis (HCC)   DVT (deep venous thrombosis) (HCC)   GERD (gastroesophageal reflux disease)   Essential hypertension   Chronic pain syndrome   Major depressive disorder, recurrent episode (HCC)   Overdose by acetaminophen, accidental or unintentional, initial encounter  Overdose by acetaminophen, opioids,  initial encounter Patient had arrived to ED with minimal responsiveness and agonal respirations.   -patient had reported taking 15 tablets of Percocet, denies SI Admitted to ICU by CCM, started on narcan infusion, mentation improved.  -tylenol at 138 on admission, treated with NAC. Tylenol level less than 10 subsequently.  -Psych consulted, who recommended inpatient psychiatric facility admission.  Medical clear, awaiting psychiatric facility   History of DVT and PE;  On prophylaxis dose xarelto since 2018. Will defer to PCP doses.   Abdominal Pain; chronic pain for years, since 2012. After he had sx.  Lipase normal, LFT normal    Pain improved with Carafate.   MS;  - MRI of the brain and C-spine negative for new enhancements or lesions.  Dr.Rai d/w NEuro Dr.Arora on 5/13, No need for IV steroids Wheelchair bound  - Placed on baclofen 20 mg 3 times daily, he also receives Ocrevus infusion every 6 months, followed by neurology outpatient Dr. Tinnie Gens. -Continue Keppra, Trileptal.  GERD; PPI   Right shoulder pain, chronic pain syndrome X ray was negative for fracture.   Bilateral PNA;  Chest x ray showed multilobar PNA.  He was treated with ceftriaxone and azithromycin.   Migraine headaches Placed back on Topamax, Imitrex as     DVT prophylaxis: Xarelto Code Status: full code.  Family Communication: care discussed with patient.  Disposition Plan: awaiting psychiatric facility . He was IVC last night   Consultants:   CCM  Psych    Procedures:  MRI brain C-spine      Antimicrobials:   Completed course of ceftriaxone and azithromycin.      Subjective: Sitting in chair. Denies pain.     Objective: Vitals:   04/01/18 2314 04/02/18 0557 04/02/18 2132 04/03/18 1406  BP: 116/81 122/83 103/75 102/73  Pulse: 77 69 82 69  Resp: 18 16 18 16   Temp: 98.2 F (36.8 C) (!) 97.5 F (36.4 C) 98.6 F (37 C) 98 F (36.7 C)  TempSrc: Oral Oral Oral Oral  SpO2: 95% 94%  100%  Weight:  78.3 kg (172 lb 9.9 oz)    Height:        Intake/Output Summary (Last 24 hours) at 04/03/2018 1526 Last data filed at 04/03/2018  1407 Gross per 24 hour  Intake 840 ml  Output 1000 ml  Net -160 ml   Filed Weights   03/31/18 0500 04/01/18 1100 04/02/18 0557  Weight: 78.7 kg (173 lb 8 oz) 76.8 kg (169 lb 5 oz) 78.3 kg (172 lb 9.9 oz)    Examination:  General exam: NAD Respiratory system: CTA Cardiovascular system: S 1, S 2 RRR Gastrointestinal system: BS present, soft, nt Central nervous system: Alert.  Extremities: no edema Skin: No rash     Data Reviewed: I have personally reviewed following  labs and imaging studies  CBC: Recent Labs  Lab 04/01/18 1444  WBC 6.0  HGB 11.8*  HCT 38.6*  MCV 88.3  PLT 548*   Basic Metabolic Panel: Recent Labs  Lab 03/31/18 0848  NA 141  K 3.9  CL 107  CO2 28  GLUCOSE 99  BUN 11  CREATININE 0.86  CALCIUM 9.4   GFR: Estimated Creatinine Clearance: 94.8 mL/min (by C-G formula based on SCr of 0.86 mg/dL). Liver Function Tests: Recent Labs  Lab 04/01/18 1444  AST 19  ALT 45  ALKPHOS 91  BILITOT 0.1*  PROT 6.5  ALBUMIN 3.1*   Recent Labs  Lab 04/01/18 1444  LIPASE 44   No results for input(s): AMMONIA in the last 168 hours. Coagulation Profile: No results for input(s): INR, PROTIME in the last 168 hours. Cardiac Enzymes: No results for input(s): CKTOTAL, CKMB, CKMBINDEX, TROPONINI in the last 168 hours. BNP (last 3 results) No results for input(s): PROBNP in the last 8760 hours. HbA1C: No results for input(s): HGBA1C in the last 72 hours. CBG: Recent Labs  Lab 03/28/18 1227 03/28/18 1608 03/28/18 2023 03/29/18 0037 03/29/18 0527  GLUCAP 96 129* 118* 112* 112*   Lipid Profile: No results for input(s): CHOL, HDL, LDLCALC, TRIG, CHOLHDL, LDLDIRECT in the last 72 hours. Thyroid Function Tests: No results for input(s): TSH, T4TOTAL, FREET4, T3FREE, THYROIDAB in the last 72 hours. Anemia Panel: No results for input(s): VITAMINB12, FOLATE, FERRITIN, TIBC, IRON, RETICCTPCT in the last 72 hours. Sepsis Labs: No results for input(s): PROCALCITON, LATICACIDVEN in the last 168 hours.  No results found for this or any previous visit (from the past 240 hour(s)).       Radiology Studies: No results found.      Scheduled Meds: . baclofen  20 mg Oral TID  . gabapentin  600 mg Oral TID  . levETIRAcetam  500 mg Oral BID  . Oxcarbazepine  300 mg Oral TID  . pantoprazole  40 mg Oral Q0600  . risperiDONE  0.5 mg Oral BID  . rivaroxaban  10 mg Oral QPM  . sertraline  50 mg Oral Daily  . sucralfate  1 g Oral TID  WC & HS  . topiramate  100 mg Oral QHS  . topiramate  50 mg Oral q morning - 10a  . triamterene-hydrochlorothiazide  1 tablet Oral Daily   Continuous Infusions:   LOS: 16 days    Time spent: 35 minutes.     Alba Cory, MD [facility] Pager [phone]  If 7PM-7AM, please contact night-coverage [email or web address] Password [facility] 04/03/2018, 3:26 PM

## 2018-04-04 ENCOUNTER — Other Ambulatory Visit: Payer: Self-pay

## 2018-04-04 NOTE — Progress Notes (Signed)
PROGRESS NOTE    [name]  ZOX:[id number] DOB: 08-Jun-1961 DOA: 03/17/2018 PCP: Fleet Contras, MD   Brief Narrative: Admitted by CCM to ICU on 75/76 57 year old male with PMH as below, which is significant for MS, Chronic pain, Depression, DVT, and gait abnormality. His family noted him to be minimally responsive on the evening hours of 5/8 and called EMS. Upon their arrival the patient had agonal respirations, remained somnolent upon arrival to ED and was given IV narcan. He responded very well and was started on infusion. Patient reported taking 15 tablets of Percocet 10-325. He claims overdose was accidental and he had no intention of harming himself.  PCCM admitted for Narcan infusion, also given NAC due to elevated tylenol elvel -Improved Transferred to [facility], Psych eval completed, now stable awaiting inpatient Psych bed  Assessment & Plan:   Principal Problem:   Suicide attempt [facility]) Active Problems:   PULMONARY EMBOLISM, HX OF   Multiple sclerosis (HCC)   DVT (deep venous thrombosis) (HCC)   GERD (gastroesophageal reflux disease)   Essential hypertension   Chronic pain syndrome   Major depressive disorder, recurrent episode (HCC)   Overdose by acetaminophen, accidental or unintentional, initial encounter  Overdose by acetaminophen, opioids,  initial encounter Patient had arrived to ED with minimal responsiveness and agonal respirations.   -patient had reported taking 15 tablets of Percocet, denies SI Admitted to ICU by CCM, started on narcan infusion, mentation improved.  -tylenol at 138 on admission, treated with NAC. Tylenol level less than 10 subsequently.  -Psych consulted, who recommended inpatient psychiatric facility admission.  Medical clear, awaiting psychiatric facility   History of DVT and PE;  On prophylaxis dose xarelto since 2018. Will defer to PCP doses.   Abdominal Pain; chronic pain for years, since 2012. After he had sx.  Lipase normal, LFT normal  Pain  improved with Carafate.  Denies pain today   MS;  - MRI of the brain and C-spine negative for new enhancements or lesions.  Dr.Rai d/w NEuro Dr.Arora on 5/13, No need for IV steroids Wheelchair bound  - Placed on baclofen 20 mg 3 times daily, he also receives Ocrevus infusion every 6 months, followed by neurology outpatient Dr. Tinnie Gens. -Continue Keppra, Trileptal.  GERD; PPI   Right shoulder pain, chronic pain syndrome X ray was negative for fracture.   Bilateral PNA;  Chest x ray showed multilobar PNA.  He was treated with ceftriaxone and azithromycin.   Migraine headaches Placed back on Topamax, Imitrex as     DVT prophylaxis: Xarelto Code Status: full code.  Family Communication: care discussed with patient.  Disposition Plan: awaiting psychiatric facility . He was IVC   Consultants:   CCM  Psych    Procedures:  MRI brain C-spine      Antimicrobials:   Completed course of ceftriaxone and azithromycin.      Subjective: Asking to go outside. Explain to him that he cant     Objective: Vitals:   04/02/18 2132 04/03/18 1406 04/03/18 2154 04/04/18 0429  BP: 103/75 102/73 99/63 101/71  Pulse: 82 69 82 77  Resp: 18 16 16 18   Temp: 98.6 F (37 C) 98 F (36.7 C) 98.8 F (37.1 C) 98.2 F (36.8 C)  TempSrc: Oral Oral Oral Oral  SpO2:  100% 95% 96%  Weight:      Height:        Intake/Output Summary (Last 24 hours) at 04/04/2018 1511 Last data filed at 04/04/2018 1420 Gross per  24 hour  Intake 720 ml  Output 1100 ml  Net -380 ml   Filed Weights   03/31/18 0500 04/01/18 1100 04/02/18 0557  Weight: 78.7 kg (173 lb 8 oz) 76.8 kg (169 lb 5 oz) 78.3 kg (172 lb 9.9 oz)    Examination:  General exam: NAD Respiratory system: CTA Cardiovascular system: S 1, S 2 RRR Gastrointestinal system: BS present, soft, nt Central nervous system: Alert.  Extremities: No edema Skin: No rash     Data Reviewed: I have personally reviewed following labs and  imaging studies  CBC: Recent Labs  Lab 04/01/18 1444  WBC 6.0  HGB 11.8*  HCT 38.6*  MCV 88.3  PLT 548*   Basic Metabolic Panel: Recent Labs  Lab 03/31/18 0848  NA 141  K 3.9  CL 107  CO2 28  GLUCOSE 99  BUN 11  CREATININE 0.86  CALCIUM 9.4   GFR: Estimated Creatinine Clearance: 94.8 mL/min (by C-G formula based on SCr of 0.86 mg/dL). Liver Function Tests: Recent Labs  Lab 04/01/18 1444  AST 19  ALT 45  ALKPHOS 91  BILITOT 0.1*  PROT 6.5  ALBUMIN 3.1*   Recent Labs  Lab 04/01/18 1444  LIPASE 44   No results for input(s): AMMONIA in the last 168 hours. Coagulation Profile: No results for input(s): INR, PROTIME in the last 168 hours. Cardiac Enzymes: No results for input(s): CKTOTAL, CKMB, CKMBINDEX, TROPONINI in the last 168 hours. BNP (last 3 results) No results for input(s): PROBNP in the last 8760 hours. HbA1C: No results for input(s): HGBA1C in the last 72 hours. CBG: Recent Labs  Lab 03/28/18 1608 03/28/18 2023 03/29/18 0037 03/29/18 0527 04/03/18 2008  GLUCAP 129* 118* 112* 112* 104*   Lipid Profile: No results for input(s): CHOL, HDL, LDLCALC, TRIG, CHOLHDL, LDLDIRECT in the last 72 hours. Thyroid Function Tests: No results for input(s): TSH, T4TOTAL, FREET4, T3FREE, THYROIDAB in the last 72 hours. Anemia Panel: No results for input(s): VITAMINB12, FOLATE, FERRITIN, TIBC, IRON, RETICCTPCT in the last 72 hours. Sepsis Labs: No results for input(s): PROCALCITON, LATICACIDVEN in the last 168 hours.  No results found for this or any previous visit (from the past 240 hour(s)).       Radiology Studies: No results found.      Scheduled Meds: . baclofen  20 mg Oral TID  . gabapentin  600 mg Oral TID  . levETIRAcetam  500 mg Oral BID  . Oxcarbazepine  300 mg Oral TID  . pantoprazole  40 mg Oral Q0600  . risperiDONE  0.5 mg Oral BID  . rivaroxaban  10 mg Oral QPM  . sertraline  50 mg Oral Daily  . sucralfate  1 g Oral TID WC &  HS  . topiramate  100 mg Oral QHS  . topiramate  50 mg Oral q morning - 10a  . triamterene-hydrochlorothiazide  1 tablet Oral Daily   Continuous Infusions:   LOS: 17 days    Time spent: 35 minutes.     Alba Cory, MD [facility] Pager [phone]  If 7PM-7AM, please contact night-coverage [email or web address] Password [email or web address] 04/04/2018, 3:11 PM

## 2018-04-05 MED ORDER — IBUPROFEN 400 MG PO TABS
400.0000 mg | ORAL_TABLET | Freq: Once | ORAL | Status: AC
  Administered 2018-04-05: 400 mg via ORAL
  Filled 2018-04-05: qty 1

## 2018-04-05 NOTE — Social Work (Addendum)
CSW called [facility]. Pt remains on wait list. CSW has received no other offers from psychiatric facilities with beds.   Doy Hutching, LCSWA [facility] Clinical Social Work [phone]

## 2018-04-05 NOTE — Progress Notes (Signed)
PROGRESS NOTE    [name]  LKT:[id number] DOB: 1961-10-09 DOA: 03/17/2018 PCP: Fleet Contras, MD   Brief Narrative: Admitted by CCM to ICU on 37/8 57 year old male with PMH as below, which is significant for MS, Chronic pain, Depression, DVT, and gait abnormality. His family noted him to be minimally responsive on the evening hours of 5/8 and called EMS. Upon their arrival the patient had agonal respirations, remained somnolent upon arrival to ED and was given IV narcan. He responded very well and was started on infusion. Patient reported taking 15 tablets of Percocet 10-325. He claims overdose was accidental and he had no intention of harming himself.  PCCM admitted for Narcan infusion, also given NAC due to elevated tylenol elvel -Improved Transferred to [facility], Psych eval completed, now stable awaiting inpatient Psych bed  Assessment & Plan:   Principal Problem:   Suicide attempt [facility]) Active Problems:   PULMONARY EMBOLISM, HX OF   Multiple sclerosis (HCC)   DVT (deep venous thrombosis) (HCC)   GERD (gastroesophageal reflux disease)   Essential hypertension   Chronic pain syndrome   Major depressive disorder, recurrent episode (HCC)   Overdose by acetaminophen, accidental or unintentional, initial encounter  Overdose by acetaminophen, opioids,  initial encounter Patient had arrived to ED with minimal responsiveness and agonal respirations.   -patient had reported taking 15 tablets of Percocet, denies SI Admitted to ICU by CCM, started on narcan infusion, mentation improved.  -tylenol at 138 on admission, treated with NAC. Tylenol level less than 10 subsequently.  -Psych consulted, who recommended inpatient psychiatric facility admission.  Medical clear, awaiting psychiatric facility   History of DVT and PE;  On prophylaxis dose xarelto since 2018. Will defer to PCP doses.   Abdominal Pain; chronic pain for years, since 2012. After he had sx.  Lipase normal, LFT normal  Pain  improved with Carafate.  No complaints today   MS;  - MRI of the brain and C-spine negative for new enhancements or lesions.  Dr.Rai d/w NEuro Dr.Arora on 5/13, No need for IV steroids Wheelchair bound  - Placed on baclofen 20 mg 3 times daily, he also receives Ocrevus infusion every 6 months, followed by neurology outpatient Dr. Tinnie Gens. -Continue Keppra, Trileptal.  GERD; PPI   Right shoulder pain, chronic pain syndrome X ray was negative for fracture.   Bilateral PNA;  Chest x ray showed multilobar PNA.  He was treated with ceftriaxone and azithromycin.   Migraine headaches Placed back on Topamax, Imitrex as     DVT prophylaxis: Xarelto Code Status: full code.  Family Communication: care discussed with patient.  Disposition Plan: awaiting psychiatric facility . He was IVC   Consultants:   CCM  Psych    Procedures:  MRI brain C-spine      Antimicrobials:   Completed course of ceftriaxone and azithromycin.      Subjective: He wants to speak with SW.  Denies abdominal pain    Objective: Vitals:   04/04/18 2031 04/05/18 0304 04/05/18 0611 04/05/18 1331  BP: 102/65  (!) 94/56 102/70  Pulse: 89  65 91  Resp: 16  16 16   Temp: 98.1 F (36.7 C)  98.6 F (37 C) 98.7 F (37.1 C)  TempSrc: Oral  Oral Oral  SpO2: 93%  99% 94%  Weight:  82.2 kg (181 lb 3.5 oz)    Height:        Intake/Output Summary (Last 24 hours) at 04/05/2018 1336 Last data filed at 04/05/2018 1255  Gross per 24 hour  Intake 1320 ml  Output 1140 ml  Net 180 ml   Filed Weights   2018/04/26 1100 04/02/18 0557 04/05/18 0304  Weight: 76.8 kg (169 lb 5 oz) 78.3 kg (172 lb 9.9 oz) 82.2 kg (181 lb 3.5 oz)    Examination:  General exam: NAD Respiratory system: CTA Cardiovascular system: S, 1 S 2 RRR Gastrointestinal system: BS present, soft, nt Central nervous system: Alert.      Data Reviewed: I have personally reviewed following labs and imaging studies  CBC: Recent Labs    Lab Apr 26, 2018 1444  WBC 6.0  HGB 11.8*  HCT 38.6*  MCV 88.3  PLT 548*   Basic Metabolic Panel: Recent Labs  Lab 03/31/18 0848  NA 141  K 3.9  CL 107  CO2 28  GLUCOSE 99  BUN 11  CREATININE 0.86  CALCIUM 9.4   GFR: Estimated Creatinine Clearance: 94.8 mL/min (by C-G formula based on SCr of 0.86 mg/dL). Liver Function Tests: Recent Labs  Lab 2018/04/26 1444  AST 19  ALT 45  ALKPHOS 91  BILITOT 0.1*  PROT 6.5  ALBUMIN 3.1*   Recent Labs  Lab April 26, 2018 1444  LIPASE 44   No results for input(s): AMMONIA in the last 168 hours. Coagulation Profile: No results for input(s): INR, PROTIME in the last 168 hours. Cardiac Enzymes: No results for input(s): CKTOTAL, CKMB, CKMBINDEX, TROPONINI in the last 168 hours. BNP (last 3 results) No results for input(s): PROBNP in the last 8760 hours. HbA1C: No results for input(s): HGBA1C in the last 72 hours. CBG: Recent Labs  Lab 04/03/18 2008  GLUCAP 104*   Lipid Profile: No results for input(s): CHOL, HDL, LDLCALC, TRIG, CHOLHDL, LDLDIRECT in the last 72 hours. Thyroid Function Tests: No results for input(s): TSH, T4TOTAL, FREET4, T3FREE, THYROIDAB in the last 72 hours. Anemia Panel: No results for input(s): VITAMINB12, FOLATE, FERRITIN, TIBC, IRON, RETICCTPCT in the last 72 hours. Sepsis Labs: No results for input(s): PROCALCITON, LATICACIDVEN in the last 168 hours.  No results found for this or any previous visit (from the past 240 hour(s)).       Radiology Studies: No results found.      Scheduled Meds: . baclofen  20 mg Oral TID  . gabapentin  600 mg Oral TID  . levETIRAcetam  500 mg Oral BID  . Oxcarbazepine  300 mg Oral TID  . pantoprazole  40 mg Oral Q0600  . risperiDONE  0.5 mg Oral BID  . rivaroxaban  10 mg Oral QPM  . sertraline  50 mg Oral Daily  . sucralfate  1 g Oral TID WC & HS  . topiramate  100 mg Oral QHS  . topiramate  50 mg Oral q morning - 10a  . triamterene-hydrochlorothiazide  1  tablet Oral Daily   Continuous Infusions:   LOS: 18 days    Time spent: 35 minutes.     Alba Cory, MD [facility] Pager [phone]  If 7PM-7AM, please contact night-coverage [email or web address] Password [facility] 04/05/2018, 1:36 PM

## 2018-04-06 NOTE — Progress Notes (Signed)
Physical Therapy Treatment Patient Details Name: [name] MRN: [id number] DOB: 1961/09/13 Today's Date: 04/06/2018    History of Present Illness Pt is a 57 y.o. male with PMH significant for MS, chronic pain, depression, DVT, and gait abnormality, admitted 03/17/18 after found minimally responsive with acetaminophen overdose; denies SI.    PT Comments    Pt agreeable to work with OT/PT in order to ambulate out of his room, however refuses to be rushed in the process. Once pt redirected in conversation able to progress donning socks and getting up for ambulation. Pt conversation very repetitive, to compensate for lapses in memory. Pt minA for transfer from bed to RW and min guard for ambulation of 150 feet, at one point he stopped abruptly to look at a stain on the floor, other wise ambulated with a slow constant gait. D/c plans continue to remain appropriate at this time.     Follow Up Recommendations  (IP psych facility)     Equipment Recommendations  None recommended by PT    Recommendations for Other Services       Precautions / Restrictions Precautions Precautions: Fall Precaution Comments: Suicide sitter Restrictions Weight Bearing Restrictions: No    Mobility  Bed Mobility               General bed mobility comments: sitting EoB when entered, working with OT to put on socks  Transfers Overall transfer level: Needs assistance Equipment used: Rolling walker (2 wheeled) Transfers: Sit to/from Stand Sit to Stand: Min assist         General transfer comment: pt felt he was being rushed, once he was ready required minA to steady with RW  Ambulation/Gait Ambulation/Gait assistance: Min guard Ambulation Distance (Feet): 150 Feet Assistive device: Rolling walker (2 wheeled) Gait Pattern/deviations: Step-to pattern;Decreased weight shift to right;Antalgic Gait velocity: Decreased Gait velocity interpretation: 1.31 - 2.62 ft/sec, indicative of limited  community ambulator General Gait Details: hands on min guard for safety, vc for proximity to RW, continues to externally rotate R LE and drag it behind him with reliance on L LE and bilateral UE support to reduce weightbearing through R LE low ambulation with RW, heavy reliance on BUE/LLE support with RLE externally rotated and dragging behind pt; pt able to somewhat translate RLE forward with hip flexion. Min guard for safety, but no physical assist required, vc for increased R hip flexion for R LE advancement however pt did not attempt         Balance Overall balance assessment: Needs assistance Sitting-balance support: Feet supported Sitting balance-Leahy Scale: Good Sitting balance - Comments: Indep to don socks sitting EOB   Standing balance support: Bilateral upper extremity supported Standing balance-Leahy Scale: Poor Standing balance comment: Reliant on UE support                            Cognition Arousal/Alertness: Awake/alert Behavior During Therapy: Flat affect Overall Cognitive Status: No family/caregiver present to determine baseline cognitive functioning Area of Impairment: Safety/judgement;Attention;Following commands                   Current Attention Level: Selective   Following Commands: Follows multi-step commands inconsistently Safety/Judgement: Decreased awareness of safety;Decreased awareness of deficits     General Comments: very repetitious with his reponses, before moving onto the next thought             Pertinent Vitals/Pain Pain Assessment: Faces Faces Pain Scale: Hurts little  more Pain Location: R shoulder Pain Descriptors / Indicators: Discomfort;Sore  VSS           PT Goals (current goals can now be found in the care plan section) Acute Rehab PT Goals Patient Stated Goal: Decreased shoulder pain PT Goal Formulation: With patient Time For Goal Achievement: 04/05/18 Potential to Achieve Goals: Fair     Frequency    Min 2X/week      PT Plan Current plan remains appropriate    Co-evaluation PT/OT/SLP Co-Evaluation/Treatment: Yes Reason for Co-Treatment: Necessary to address cognition/behavior during functional activity PT goals addressed during session: Mobility/safety with mobility        AM-PAC PT "6 Clicks" Daily Activity  Outcome Measure  Difficulty turning over in bed (including adjusting bedclothes, sheets and blankets)?: None Difficulty moving from lying on back to sitting on the side of the bed? : None Difficulty sitting down on and standing up from a chair with arms (e.g., wheelchair, bedside commode, etc,.)?: A Little Help needed moving to and from a bed to chair (including a wheelchair)?: A Little Help needed walking in hospital room?: A Little Help needed climbing 3-5 steps with a railing? : A Lot 6 Click Score: 19    End of Session Equipment Utilized During Treatment: Gait belt Activity Tolerance: Patient tolerated treatment well Patient left: in bed;with call bell/phone within reach;with nursing/sitter in room Nurse Communication: Mobility status PT Visit Diagnosis: Unsteadiness on feet (R26.81)     Time: 1610-9604 PT Time Calculation (min) (ACUTE ONLY): 23 min  Charges:  $Gait Training: 8-22 mins                    G Codes:       [name] [name] PT, DPT Acute Rehabilitation  [phone] Pager [phone]     [name] [name] 04/06/2018, 4:05 PM

## 2018-04-06 NOTE — Progress Notes (Addendum)
PROGRESS NOTE    [name]  MHW:[id number] DOB: [birth year] DOA: 03/17/2018 PCP: Fleet Contras, MD   Brief Narrative: Admitted by CCM to ICU on 68/39 57 year old male with PMH as below, which is significant for MS, Chronic pain, Depression, DVT, and gait abnormality. His family noted him to be minimally responsive on the evening hours of 5/8 and called EMS. Upon their arrival the patient had agonal respirations, remained somnolent upon arrival to ED and was given IV narcan. He responded very well and was started on infusion. Patient reported taking 15 tablets of Percocet 10-325. He claims overdose was accidental and he had no intention of harming himself.  PCCM admitted for Narcan infusion, also given NAC due to elevated tylenol level. He finished NAC therapy. She was wean of narcan. His mental status improved.  -Improved Transferred to [facility], Psych eval completed, now stable awaiting inpatient Psych bed -Awaiting psychiatrist facility. He has been medically stable/   Assessment & Plan:   Principal Problem:   Suicide attempt [facility]) Active Problems:   PULMONARY EMBOLISM, HX OF   Multiple sclerosis (HCC)   DVT (deep venous thrombosis) (HCC)   GERD (gastroesophageal reflux disease)   Essential hypertension   Chronic pain syndrome   Major depressive disorder, recurrent episode (HCC)   Overdose by acetaminophen, accidental or unintentional, initial encounter  Overdose by acetaminophen, opioids,  initial encounter Patient had arrived to ED with minimal responsiveness and agonal respirations.   -patient had reported taking 15 tablets of Percocet, denies SI Admitted to ICU by CCM, started on narcan infusion, mentation improved.  -tylenol at 138 on admission, treated with NAC. Tylenol level less than 10 subsequently.  -Psych consulted, who recommended inpatient psychiatric facility admission.  Medical clear, awaiting psychiatric facility   History of DVT and PE;  On prophylaxis dose xarelto  since 2018. Will defer to PCP doses.   Abdominal Pain; chronic pain for years, since 2012. After he had sx.  Lipase normal, LFT normal  Pain improved with Carafate.  Denies abdominal pain   MS;  - MRI of the brain and C-spine negative for new enhancements or lesions.  Dr.Rai d/w NEuro Dr.Arora on 5/13, No need for IV steroids Wheelchair bound  - Placed on baclofen 20 mg 3 times daily, he also receives Ocrevus infusion every 6 months, followed by neurology outpatient Dr. Tinnie Gens. -Continue Keppra, Trileptal.  GERD; PPI   Right shoulder pain, chronic pain syndrome X ray was negative for fracture.   Bilateral PNA;  Chest x ray showed multilobar PNA.  He was treated with ceftriaxone and azithromycin.   Migraine headaches Placed back on Topamax, Imitrex as     DVT prophylaxis: Xarelto Code Status: full code.  Family Communication: care discussed with patient.  Disposition Plan: awaiting psychiatric facility . He was IVC   Consultants:   CCM  Psych    Procedures:  MRI brain C-spine      Antimicrobials:   Completed course of ceftriaxone and azithromycin.      Subjective: He denies  abdominal pain    Objective: Vitals:   04/05/18 2217 04/06/18 0500 04/06/18 0600 04/06/18 1355  BP: 98/68  93/60 118/75  Pulse: 83  87 81  Resp: 17  18 18   Temp: 98.6 F (37 C)  97.8 F (36.6 C) 98.1 F (36.7 C)  TempSrc: Oral  Oral Oral  SpO2: 96%  94% 97%  Weight:  84 kg (185 lb 3 oz)    Height:  Intake/Output Summary (Last 24 hours) at 04/06/2018 1447 Last data filed at 04/06/2018 0900 Gross per 24 hour  Intake 444 ml  Output 400 ml  Net 44 ml   Filed Weights   04/02/18 0557 04/05/18 0304 04/06/18 0500  Weight: 78.3 kg (172 lb 9.9 oz) 82.2 kg (181 lb 3.5 oz) 84 kg (185 lb 3 oz)    Examination:  General exam: NAD Respiratory system: CTA Cardiovascular system: S 1, S 2 RRR Gastrointestinal system: BS present, soft, nt Central nervous system: Alert.        Data Reviewed: I have personally reviewed following labs and imaging studies  CBC: Recent Labs  Lab 04/01/18 1444  WBC 6.0  HGB 11.8*  HCT 38.6*  MCV 88.3  PLT 548*   Basic Metabolic Panel: Recent Labs  Lab 03/31/18 0848  NA 141  K 3.9  CL 107  CO2 28  GLUCOSE 99  BUN 11  CREATININE 0.86  CALCIUM 9.4   GFR: Estimated Creatinine Clearance: 94.8 mL/min (by C-G formula based on SCr of 0.86 mg/dL). Liver Function Tests: Recent Labs  Lab 04/01/18 1444  AST 19  ALT 45  ALKPHOS 91  BILITOT 0.1*  PROT 6.5  ALBUMIN 3.1*   Recent Labs  Lab 04/01/18 1444  LIPASE 44   No results for input(s): AMMONIA in the last 168 hours. Coagulation Profile: No results for input(s): INR, PROTIME in the last 168 hours. Cardiac Enzymes: No results for input(s): CKTOTAL, CKMB, CKMBINDEX, TROPONINI in the last 168 hours. BNP (last 3 results) No results for input(s): PROBNP in the last 8760 hours. HbA1C: No results for input(s): HGBA1C in the last 72 hours. CBG: Recent Labs  Lab 04/03/18 2008  GLUCAP 104*   Lipid Profile: No results for input(s): CHOL, HDL, LDLCALC, TRIG, CHOLHDL, LDLDIRECT in the last 72 hours. Thyroid Function Tests: No results for input(s): TSH, T4TOTAL, FREET4, T3FREE, THYROIDAB in the last 72 hours. Anemia Panel: No results for input(s): VITAMINB12, FOLATE, FERRITIN, TIBC, IRON, RETICCTPCT in the last 72 hours. Sepsis Labs: No results for input(s): PROCALCITON, LATICACIDVEN in the last 168 hours.  No results found for this or any previous visit (from the past 240 hour(s)).       Radiology Studies: No results found.      Scheduled Meds: . baclofen  20 mg Oral TID  . gabapentin  600 mg Oral TID  . levETIRAcetam  500 mg Oral BID  . Oxcarbazepine  300 mg Oral TID  . pantoprazole  40 mg Oral Q0600  . risperiDONE  0.5 mg Oral BID  . rivaroxaban  10 mg Oral QPM  . sertraline  50 mg Oral Daily  . sucralfate  1 g Oral TID WC & HS  .  topiramate  100 mg Oral QHS  . topiramate  50 mg Oral q morning - 10a  . triamterene-hydrochlorothiazide  1 tablet Oral Daily   Continuous Infusions:   LOS: 19 days    Time spent: 35 minutes.     Alba Cory, MD [facility] Pager [phone]  If 7PM-7AM, please contact night-coverage [email or web address] Password [facility] 04/06/2018, 2:47 PM

## 2018-04-06 NOTE — Progress Notes (Signed)
Occupational Therapy Treatment Patient Details Name: [name] MRN: [id number] DOB: [birth year] Today's Date: 04/06/2018    History of present illness Pt is a 57 y.o. male with PMH significant for MS, chronic pain, depression, DVT, and gait abnormality, admitted 03/17/18 after found minimally responsive with acetaminophen overdose; denies SI.   OT comments  Pt appears  agitated at times; however, able to redirect pt and complete goals of session with encouragement. Completing ADL sitting EOB with increased time. Tangential at times in conversation and repetitive. Decreased insight/awareness indicated by pt stating he is going to put on his pants and shoes and leave to go outside. Goals updated. Frequency decreased to 1x/wk.Will continue to follow acutely.   Follow Up Recommendations  Other (comment)(behavior health)    Equipment Recommendations  Other (comment)(TBA)    Recommendations for Other Services      Precautions / Restrictions Precautions Precautions: Fall Precaution Comments: Suicide sitter Restrictions Weight Bearing Restrictions: No       Mobility Bed Mobility               General bed mobility comments: sitting EOB on arrival  Transfers Overall transfer level: Needs assistance Equipment used: Rolling walker (2 wheeled) Transfers: Sit to/from Stand Sit to Stand: Min guard         General transfer comment: pt felt he was being rushed    Balance Overall balance assessment: Needs assistance Sitting-balance support: Feet supported Sitting balance-Leahy Scale: Good Sitting balance - Comments: Indep to don socks sitting EOB   Standing balance support: Bilateral upper extremity supported Standing balance-Leahy Scale: Poor Standing balance comment: Reliant on UE support                           ADL either performed or assessed with clinical judgement   ADL Overall ADL's : Needs assistance/impaired                     Lower Body  Dressing: Set up;Bed level               Functional mobility during ADLs: Minimal assistance;Rolling walker;Cueing for safety       Vision       Perception     Praxis      Cognition Arousal/Alertness: Awake/alert Behavior During Therapy: Agitated Overall Cognitive Status: No family/caregiver present to determine baseline cognitive functioning Area of Impairment: Safety/judgement;Awareness                   Current Attention Level: Selective   Following Commands: Follows multi-step commands inconsistently Safety/Judgement: Decreased awareness of safety;Decreased awareness of deficits Awareness: Emergent   General Comments: tangential at times; repetitious  Able to hold conversation while walking; difficulty finishing thought process; 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      Exercises     Shoulder Instructions       General Comments      Pertinent Vitals/ Pain       Pain Assessment: Faces Faces Pain Scale: Hurts little more Pain Location: R shoulder Pain Descriptors / Indicators: Discomfort;Sore Pain Intervention(s): Limited activity within patient's tolerance  Home Living                                          Prior Functioning/Environment  Frequency  Min 2X/week        Progress Toward Goals  OT Goals(current goals can now be found in the care plan section)  Progress towards OT goals: Progressing toward goals  Acute Rehab OT Goals Patient Stated Goal: to get out of here OT Goal Formulation: With patient Time For Goal Achievement: 04/20/18 Potential to Achieve Goals: Good ADL Goals Pt Will Perform Lower Body Bathing: with modified independence;sit to/from stand Pt Will Perform Lower Body Dressing: with modified independence;sit to/from stand Pt Will Transfer to Toilet: with modified independence;ambulating Pt Will Perform Toileting - Clothing Manipulation and hygiene:  with modified independence  Plan Discharge plan remains appropriate    Co-evaluation    PT/OT/SLP Co-Evaluation/Treatment: Yes(partial session) Reason for Co-Treatment: Necessary to address cognition/behavior during functional activity PT goals addressed during session: Mobility/safety with mobility OT goals addressed during session: ADL's and self-care      AM-PAC PT "6 Clicks" Daily Activity     Outcome Measure   Help from another person eating meals?: None Help from another person taking care of personal grooming?: A Little Help from another person toileting, which includes using toliet, bedpan, or urinal?: A Little Help from another person bathing (including washing, rinsing, drying)?: A Little Help from another person to put on and taking off regular upper body clothing?: None Help from another person to put on and taking off regular lower body clothing?: A Little 6 Click Score: 20    End of Session Equipment Utilized During Treatment: Gait belt;Rolling walker  OT Visit Diagnosis: Muscle weakness (generalized) (M62.81);Other symptoms and signs involving cognitive function;Other abnormalities of gait and mobility (R26.89)   Activity Tolerance Patient tolerated treatment well   Patient Left in bed;with call bell/phone within reach;with nursing/sitter in room   Nurse Communication Mobility status        Time: 1610-9604 OT Time Calculation (min): 26 min  Charges: OT General Charges $OT Visit: 1 Visit OT Treatments $Self Care/Home Management : 8-22 mins  Luisa Dago, OT/L  OT Clinical Specialist [phone]    [facility] 04/06/2018, 4:22 PM

## 2018-04-07 ENCOUNTER — Ambulatory Visit: Payer: 59

## 2018-04-07 NOTE — Progress Notes (Signed)
Patient seen and examined no changes, chart reviewed -Continues to await inpatient psych hospitalization for 20 days now -Denies any suicidal intent or ideation  Zannie Cove, MD

## 2018-04-07 NOTE — Progress Notes (Addendum)
Pt became upset when staff informed pt he could not keep personal belongings in the room that were inappropriate items for suicidal patients. Pt started yelling, trying to leave --threatening he was going to leave and demanding his belongings. Unit charge nurse in with pt and also attempting to explain hospital policy on belongings that cannot be in room when on suicidal watch.  Security notified and came up to unit/pt's room, the items were removed from pt's room by the charge nurse and placed at the units front desk area for safe keeping with pt's name placed on them.  Pt informed that his items would be returned at the appropriate time of his care when he was safe to have them.  Pt for IVC.  Pt did calm after a while, suicide sitter remains at pt's bedside. Security left unit.   Dr Jomarie Longs notified of the above, she had already informed the pt that psych would be coming to re-eval him tomorrow.  No new orders. Will inform pt again of plan for psych to re-eval him tomorrow and encourage him to work with staff to keep him safe and to remain calm.

## 2018-04-07 NOTE — Progress Notes (Signed)
CSW confirmed that patient remains on [employer]. CSW paged MD regarding patient wish to speak with her.   Osborne Casco Kolbi Altadonna LCSW [phone]

## 2018-04-08 DIAGNOSIS — F329 Major depressive disorder, single episode, unspecified: Secondary | ICD-10-CM

## 2018-04-08 DIAGNOSIS — T50901A Poisoning by unspecified drugs, medicaments and biological substances, accidental (unintentional), initial encounter: Secondary | ICD-10-CM

## 2018-04-08 MED ORDER — ALTEPLASE 2 MG IJ SOLR
2.0000 mg | Freq: Once | INTRAMUSCULAR | Status: AC
  Administered 2018-04-08: 2 mg
  Filled 2018-04-08: qty 2

## 2018-04-08 NOTE — Consult Note (Addendum)
[facility] Psych Consult Progress Note  04/08/2018 1:20 PM [name]  MRN:  [id number] Subjective:   [name] was admitted with Percocet overdose. He ingested 30-40 Percocet 10-325 mg tablets. He required Narcan en route and Tylenol level was 136 on admission. was last seen on 5/22 by psychiatry. He was recommended for inpatient psychiatric hospitalization.   On interview, [name] reports that his mood is "fine."  He denies SI, HI or AVH.  He reports that his "crazy cousin" said that he wanted to kill himself but he denies feeling like this.  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  He also worries about following up with his neurologist since his appointment is scheduled for next week.  Patient's mother was contacted again today to see if there were any safety concerns.  She denies any concerns that patient will harm himself at this time.  She is okay with him being discharged home and was informed that he should follow-up with his outpatient psychiatrist.  Principal Problem: Drug overdose Diagnosis:   Patient Active Problem List   Diagnosis Date Noted  . Suicide attempt (HCC) [T14.91XA]   . Overdose by acetaminophen, accidental or unintentional, initial encounter [T39.1X1A] 03/18/2018  . Intentional acetaminophen overdose (HCC) [T39.1X2A]   . 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 Weakness [R53.1]   . Cellulitis of right leg [L03.115] 05/07/2017  . Right carpal tunnel syndrome [G56.01] 02/16/2017  . Gait disturbance [R26.9] 01/22/2017  . Insomnia [G47.00] 01/22/2017  . Depression with anxiety [F41.8] 01/22/2017  . High risk medication use [Z79.899] 01/22/2017  . Major depressive disorder, recurrent episode (HCC) [F33.9] 12/19/2016  . Suicidal ideation [R45.851]   . AKI (acute kidney injury) (HCC) [N17.9] 11/15/2016  . Chronic hepatitis C virus infection (HCC) [B18.2] 11/15/2016  .  Chronic abdominal pain [R10.9, G89.29] 11/15/2016  . Chronic pain syndrome [G89.4] 11/15/2016  . Acute retention of urine [R33.8] 11/15/2016  . Generalized abdominal pain [R10.84]   . Chronic cluster headache, not intractable [G44.029]   . Community acquired pneumonia [J18.9]   . 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 HCAP (healthcare-associated pneumonia) [J18.9] 02/16/2016  . Hypokalemia [E87.6] 02/16/2016  . Intractable cluster headache syndrome [G44.001]   . DVT (deep venous thrombosis) (HCC) [I82.409]   . Depression [F32.9]   . GERD (gastroesophageal reflux disease) [K21.9]   . HTN (hypertension) [I10]   . Essential hypertension [I10]   . Gastroesophageal reflux disease without esophagitis [K21.9]   . CAP (community acquired pneumonia) [J18.9]   . Multiple sclerosis (HCC) [G35] 08/02/2013  . ABDOMINAL BLOATING [R14.3, R14.1, R14.2] 10/14/2010  . LOOSE STOOLS [R19.7] 10/14/2010  . PULMONARY EMBOLISM, HX OF [Z86.718] 10/14/2010   Total Time spent with patient: 15 minutes  Past Psychiatric History: MDD with psychosis and anxiety.   Past Medical History:  Past Medical History:  Diagnosis Date  . Abdominal pain, unspecified site   . Anxiety   . Arthritis   . Benign paroxysmal positional vertigo   . Cellulitis and abscess of right leg 04/2017  . Chronic back pain   . Chronic pain syndrome 01/25/2008  . Cluster headache   . Depression    takes Zoloft daily  . DVT (deep venous thrombosis) (HCC)    in the left arm '09  . Gait abnormality    "uses mobile wheelchair, but is ambulatory"  . Gallstones 02/17/2009   resolved after gallbladder surgery.  [name] GERD (gastroesophageal  reflux disease)    takes Omeprazole as needed  . Headache(784.0)    cluster headaches frequently-takes Topamax daily  . History of colonoscopy   . HTN (hypertension)    takes Lisinopril,Verapamil,and Triamterene HCTZ daily  . Insomnia 11/06/2008  . Joint pain   . Joint swelling    03-07-16 "swelling of right  wrist" "after a fall-xray done 03-06-16 "no fractures".  . Memory loss    no an issue at present 03-07-16  . Multiple sclerosis (HCC)    Dx. 2005 - Dr. Tinnie Gens follows LOV 4'17 tx. Tysabri monthly IV-[facility] , [place].  [name] Nonspecific elevation of levels of transaminase or lactic acid dehydrogenase (LDH)   . Other specified visual disturbances   . Other syndromes affecting cervical region   . Pneumonia 2009  . Trigeminal neuralgia     history" Multiple sclerosis"    Past Surgical History:  Procedure Laterality Date  . CHOLECYSTECTOMY  02/20/2009  . COLONOSCOPY WITH PROPOFOL N/A 03/17/2016   Procedure: COLONOSCOPY WITH PROPOFOL;  Surgeon: [name], MD;  Location: [facility] ENDOSCOPY;  Service: Endoscopy;  Laterality: N/A;  . PORT A CATH REVISION N/A 07/06/2015   Procedure: Removal and replacement of PORT A CATH;  Surgeon: [name], MD;  [facility]: [facility] OR;  Service: General;  Laterality: N/A;  . PORTACATH PLACEMENT N/A 03/27/2014   Procedure: INSERTION PORT-A-CATH;  Surgeon: [name], MD;  Location: [facility] OR;  Service: General;  Laterality: N/A;   Family History:  Family History  Problem Relation Age of Onset  . Cancer Father   . Diabetes Mother    Family Psychiatric  History: Denies  Social History:  Social History   Substance and Sexual Activity  Alcohol Use Yes  . Alcohol/week: 0.0 oz   Comment: occasional     Social History   Substance and Sexual Activity  Drug Use No   Comment: Quit 2011    Social History   Socioeconomic History  . Marital status: Divorced    Spouse name: Not on file  . Number of children: 3  . Years of education: 17 th  . Highest education level: Not on file  Occupational History    Comment: Disabled  Social Needs  . Financial resource strain: Not on file  . Food insecurity:    Worry: Not on file    Inability: Not on file  . Transportation needs:    Medical: Not on file    Non-medical: Not on file  Tobacco Use  .  Smoking status: Former Smoker    Packs/day: 1.00    Years: 38.00    Pack years: 38.00    Types: Cigarettes  . Smokeless tobacco: Never Used  . Tobacco comment: cutting back  Substance and Sexual Activity  . Alcohol use: Yes    Alcohol/week: 0.0 oz    Comment: occasional  . Drug use: No    Comment: Quit 2011  . Sexual activity: Not on file  Lifestyle  . Physical activity:    Days per week: Not on file    Minutes per session: Not on file  . Stress: Not on file  Relationships  . Social connections:    Talks on phone: Not on file    Gets together: Not on file    Attends religious service: Not on file    Active member of club or organization: Not on file    Attends meetings of clubs or organizations: Not on file    Relationship status: Not on  file  Other Topics Concern  . Not on file  Social History Narrative   Patient is divorced. Patient is disabled. Because of his MS. Patient has 11 th grade education. 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    Right handed.   Caffeine- One daily.    Sleep: Good  Appetite:  Good  Current Medications: Current Facility-Administered Medications  Medication Dose Route Frequency Provider Last Rate Last Dose  . baclofen (LIORESAL) tablet 20 mg  20 mg Oral TID Rai, Ripudeep K, MD   20 mg at 04/08/18 0936  . gabapentin (NEURONTIN) tablet 600 mg  600 mg Oral TID Rai, Ripudeep K, MD   600 mg at 04/08/18 0936  . hydrALAZINE (APRESOLINE) injection 10 mg  10 mg Intravenous Q4H PRN Whiteheart, Kathryn A, NP      . hydrOXYzine (ATARAX/VISTARIL) tablet 25 mg  25 mg Oral TID PRN Rai, Ripudeep K, MD   25 mg at 04/01/18 2238  . levETIRAcetam (KEPPRA) tablet 500 mg  500 mg Oral BID Rai, Ripudeep K, MD   500 mg at 04/08/18 0936  . ondansetron (ZOFRAN) injection 4 mg  4 mg Intravenous Q6H PRN Migdalia Dk, MD   4 mg at 03/19/18 1403  . Oxcarbazepine (TRILEPTAL) tablet 300 mg  300 mg Oral TID Danford Bad A, NP   300  mg at 04/08/18 0936  . oxyCODONE-acetaminophen (PERCOCET/ROXICET) 5-325 MG per tablet 1-2 tablet  1-2 tablet Oral Q8H PRN Regalado, Belkys A, MD   2 tablet at 04/08/18 [id number]  . pantoprazole (PROTONIX) EC tablet 40 mg  40 mg Oral Q0600 Rai, Ripudeep K, MD   40 mg at 04/08/18 1191  . polyvinyl alcohol (LIQUIFILM TEARS) 1.4 % ophthalmic solution 1-2 drop  1-2 drop Both Eyes QID PRN Regalado, Belkys A, MD   2 drop at 04/03/18 2317  . risperiDONE (RISPERDAL) tablet 0.5 mg  0.5 mg Oral BID Cherly Beach, DO   0.5 mg at 04/08/18 4782  . rivaroxaban (XARELTO) tablet 10 mg  10 mg Oral QPM Whiteheart, Kathryn A, NP   10 mg at 04/07/18 1723  . sertraline (ZOLOFT) tablet 50 mg  50 mg Oral Daily Whiteheart, Kathryn A, NP   50 mg at 04/08/18 0936  . sodium chloride flush (NS) 0.9 % injection 10-40 mL  10-40 mL Intracatheter PRN Scatliffe, Gypsy Balsam, MD   10 mL at 04/07/18 0821  . sucralfate (CARAFATE) 1 GM/10ML suspension 1 g  1 g Oral TID WC & HS Regalado, Belkys A, MD   1 g at 04/08/18 1151  . SUMAtriptan (IMITREX) tablet 100 mg  100 mg Oral Q2H PRN Rai, Ripudeep K, MD   100 mg at 04/07/18 2138  . topiramate (TOPAMAX) tablet 100 mg  100 mg Oral QHS Rai, Ripudeep K, MD   100 mg at 04/07/18 2109  . topiramate (TOPAMAX) tablet 50 mg  50 mg Oral q morning - 10a Zannie Cove, MD   50 mg at 04/08/18 9562  . traZODone (DESYREL) tablet 50 mg  50 mg Oral QHS PRN Rai, Ripudeep K, MD   50 mg at 04/03/18 2316  . triamterene-hydrochlorothiazide (MAXZIDE-25) 37.5-25 MG per tablet 1 tablet  1 tablet Oral Daily Scatliffe, Gypsy Balsam, MD   1 tablet at 04/08/18 1308   Facility-Administered Medications Ordered in Other Encounters  Medication Dose Route Frequency Provider Last Rate Last Dose  . sodium chloride 0.9 % injection 10 mL  10 mL Intracatheter  PRN Loann Quill, NP   10 mL at 11/07/15 1012    Lab Results: No results found for this or any previous visit (from the past 48 hour(s)).  Blood Alcohol level:   Lab Results  Component Value Date   ETH <5 12/17/2016   ETH <5 08/23/2015    Musculoskeletal: Strength & Muscle Tone: within normal limits Gait & Station: normal Patient leans: N/A  Psychiatric Specialty Exam: Physical Exam  Nursing note and vitals reviewed. Constitutional: He is oriented to person, place, and time. He appears well-developed and well-nourished.  HENT:  Head: Normocephalic and atraumatic.  Neck: Normal range of motion.  Respiratory: Effort normal.  Musculoskeletal: Normal range of motion.  Neurological: He is alert and oriented to person, place, and time.  Psychiatric: He has a normal mood and affect. His speech is normal and behavior is normal. Judgment and thought content normal. Cognition and memory are normal.    Review of Systems  Constitutional: Negative for chills and fever.  Cardiovascular: Negative for chest pain.  Gastrointestinal: Negative for abdominal pain, constipation, diarrhea, nausea and vomiting.  Psychiatric/Behavioral: Negative for depression, hallucinations, substance abuse and suicidal ideas. The patient does not have insomnia.   All other systems reviewed and are negative.   Blood pressure 124/69, pulse 87, temperature 97.8 F (36.6 C), resp. rate 12, height 5\' 9"  (1.753 m), weight 81.1 kg (178 lb 12.7 oz), SpO2 96 %.Body mass index is 26.4 kg/m.  General Appearance: Fairly Groomed, middle aged, African American male, wearing a hospital gown with a neatly shaved face who is lying in bed. NAD.   Eye Contact:  Good  Speech:  Clear and Coherent and Normal Rate  Volume:  Normal  Mood:  Euthymic  Affect:  Appropriate and Full Range  Thought Process:  Goal Directed, Linear and Descriptions of Associations: Intact  Orientation:  Full (Time, Place, and Person)  Thought Content:  Logical  Suicidal Thoughts:  No  Homicidal Thoughts:  No  Memory:  Immediate;   Good Recent;   Good Remote;   Good  Judgement:  Good  Insight:  Good   Psychomotor Activity:  Normal  Concentration:  Concentration: Good and Attention Span: Good  Recall:  Good  Fund of Knowledge:  Good  Language:  Good  Akathisia:  No  Handed:  Right  AIMS (if indicated):   N/A  Assets:  Architect Housing Social Support  ADL's:  Intact  Cognition:  WNL  Sleep:   Okay    Assessment:  [name] is a 57 y.o. male who was admitted with questionable Percocet overdose. He continued to deny SI or suicide attempt throughout his hospitalization. It is uncertain if he accidentally or intentionally ingested his pain medications although he is currently at low risk of harm to self. He is future oriented. He does not appear clinically depressed or psychotic and has been compliant with his medications. His mother denies further concerns for his safety. He should follow up with his outpatient provider for further medication management.    Treatment Plan Summary: -Continue Zoloft 50 mg daily for depression/anxiety, Atarax 25 mg TID PRN for anxiety, Trazodone 50 mg qhs PRN for insomnia and Risperdal 0.5 mg BID for psychosis.  -Patient is psychiatrically cleared. Psychiatry will sign off on patient at this time. Please consult psychiatry again as needed.   Cherly Beach, DO 04/08/2018, 1:20 PM

## 2018-04-08 NOTE — Progress Notes (Signed)
Physical Therapy Treatment Patient Details Name: [name] MRN: [id number] DOB: 02-02-1961 Today's Date: 04/08/2018    History of Present Illness Pt is a 57 y.o. male with PMH significant for MS, chronic pain, depression, DVT, and gait abnormality, admitted 03/17/18 after found minimally responsive with acetaminophen overdose; denies SI.    PT Comments    Pt limited in safe mobility by decreased strength and impulsivity and decreased command following for safe technique for with mobility. Pt supervision for bed mobility and min guard for transfers, however by the end of ambulation of 50 feet pt required modA and maximal vc for use of RW. Pt unable to verbalize why he was having more difficulty with walking today. PT will continue to follow acutely to prevent further decline in mobility.   Follow Up Recommendations  (IP psych facility)     Equipment Recommendations  None recommended by PT       Precautions / Restrictions Precautions Precautions: Fall Precaution Comments: Suicide sitter Restrictions Weight Bearing Restrictions: No    Mobility  Bed Mobility Overal bed mobility: Needs Assistance Bed Mobility: Supine to Sit;Sit to Supine     Supine to sit: Supervision Sit to supine: Supervision   General bed mobility comments: supervision for safety with getting in and out of bed  Transfers Overall transfer level: Needs assistance Equipment used: Rolling walker (2 wheeled) Transfers: Sit to/from Stand Sit to Stand: Min guard         General transfer comment: min guard for sit to stand from bed and straight back chair, good powerup and steadying with RW  Ambulation/Gait Ambulation/Gait assistance: Mod assist Ambulation Distance (Feet): 50 Feet Assistive device: Rolling walker (2 wheeled) Gait Pattern/deviations: Step-to pattern;Decreased weight shift to right;Antalgic Gait velocity: Decreased Gait velocity interpretation: <1.8 ft/sec, indicate of risk for recurrent  falls General Gait Details: mod A for increased weakness today with gait, vc for weight shift to advance R LE, pt with difficulty following command and dragging R LE behind him, increased vc for holding onto his RW and not reaching out for furniture for support, pt with response to vc with how he was going to progress       Balance Overall balance assessment: Needs assistance Sitting-balance support: Feet supported Sitting balance-Leahy Scale: Good Sitting balance - Comments: Indep to don socks sitting in chair   Standing balance support: Bilateral upper extremity supported Standing balance-Leahy Scale: Poor Standing balance comment: Reliant on UE support                            Cognition Arousal/Alertness: Awake/alert Behavior During Therapy: Flat affect Overall Cognitive Status: No family/caregiver present to determine baseline cognitive functioning Area of Impairment: Safety/judgement;Attention;Following commands                   Current Attention Level: Selective   Following Commands: Follows multi-step commands inconsistently Safety/Judgement: Decreased awareness of safety;Decreased awareness of deficits Awareness: Emergent   General Comments: thoughts more complete today less repetition              Pertinent Vitals/Pain Pain Assessment: Faces Faces Pain Scale: Hurts little more Pain Location: R LE Pain Descriptors / Indicators: Discomfort;Sore Pain Intervention(s): Limited activity within patient's tolerance;Monitored during session           PT Goals (current goals can now be found in the care plan section) Acute Rehab PT Goals Patient Stated Goal: get out of hospital  PT Goal  Formulation: With patient Time For Goal Achievement: 04/05/18 Potential to Achieve Goals: Fair Progress towards PT goals: Not progressing toward goals - comment(increased weakness and resistance to following commands )    Frequency    Min 2X/week       PT Plan Current plan remains appropriate       AM-PAC PT "6 Clicks" Daily Activity  Outcome Measure  Difficulty turning over in bed (including adjusting bedclothes, sheets and blankets)?: None Difficulty moving from lying on back to sitting on the side of the bed? : None Difficulty sitting down on and standing up from a chair with arms (e.g., wheelchair, bedside commode, etc,.)?: A Little Help needed moving to and from a bed to chair (including a wheelchair)?: A Little Help needed walking in hospital room?: A Little Help needed climbing 3-5 steps with a railing? : A Lot 6 Click Score: 19    End of Session Equipment Utilized During Treatment: Gait belt Activity Tolerance: Patient tolerated treatment well Patient left: in bed;with call bell/phone within reach;with nursing/sitter in room Nurse Communication: Mobility status PT Visit Diagnosis: Unsteadiness on feet (R26.81)     Time: 8329-1916 PT Time Calculation (min) (ACUTE ONLY): 24 min  Charges:  $Gait Training: 8-22 mins                    G Codes:       [name] B. Beverely Risen PT, DPT Acute Rehabilitation  [phone] Pager [phone]     [name] Fleet 04/08/2018, 1:59 PM

## 2018-04-08 NOTE — Progress Notes (Signed)
Patient seen and examined -Discussed with staff -Has been awaiting inpatient psych hospitalization-day 21 now -denies suicidal intent or ideation -Psychiatry reconsulted for another assessment  Gary Cove, MD

## 2018-04-09 DIAGNOSIS — T50904D Poisoning by unspecified drugs, medicaments and biological substances, undetermined, subsequent encounter: Secondary | ICD-10-CM

## 2018-04-09 IMAGING — CT CT ABD-PELV W/O CM
2 of 4 series · 16 of 46 positions shown, 18 images · non-contrast
Comparison: 05/14/2014

CLINICAL DATA: New onset generalized abdominal pain

EXAM:
CT ABDOMEN AND PELVIS WITHOUT CONTRAST
TECHNIQUE: Multidetector CT imaging of the abdomen and pelvis was performed
following the standard protocol without IV contrast.

[Series 2: abd/pel w/o · axial · non-contrast · 0.74mm/px · z∈[-419,-19]mm · 13 of 92 slices shown, 15 images]
[im 6/92  soft-tissue]
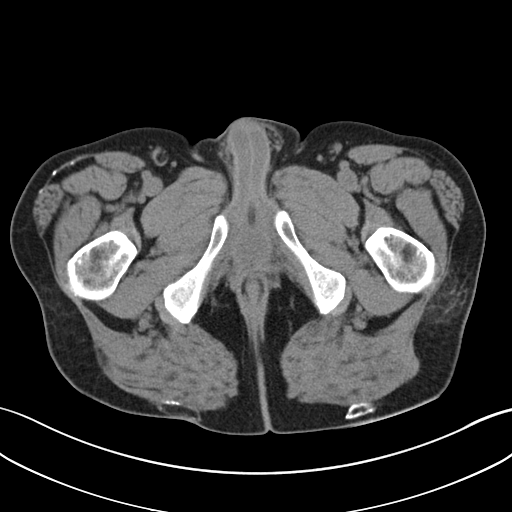
[im 6/92  bone]
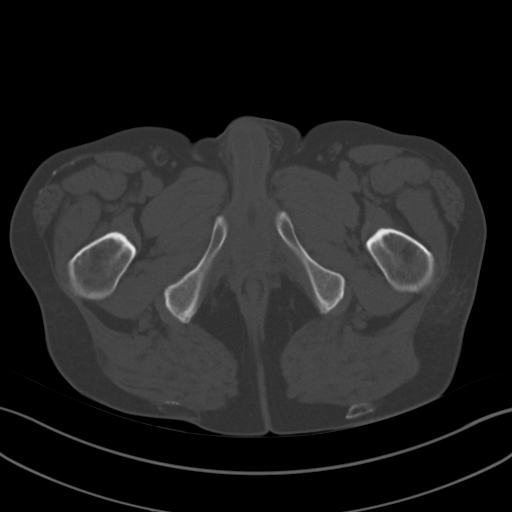
[im 11/92  soft-tissue]
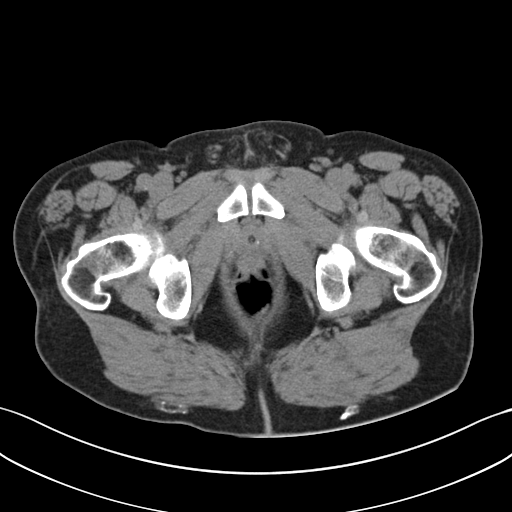
[im 21/92  soft-tissue]
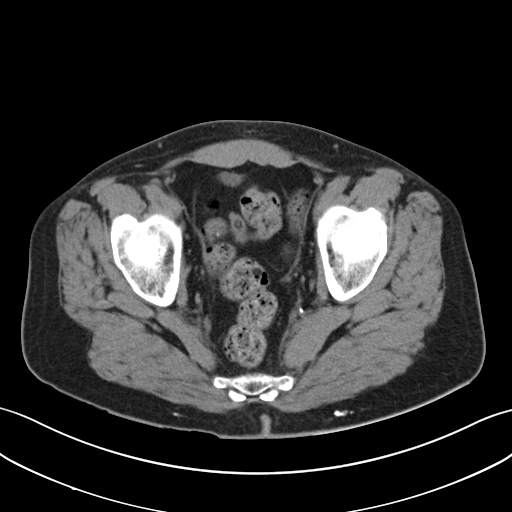
[im 26/92  soft-tissue]
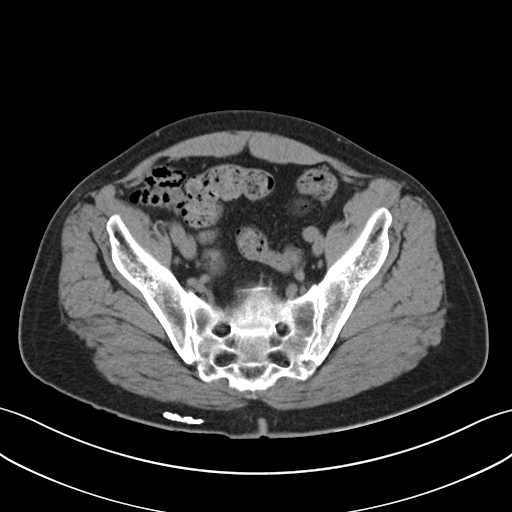
[im 31/92  soft-tissue]
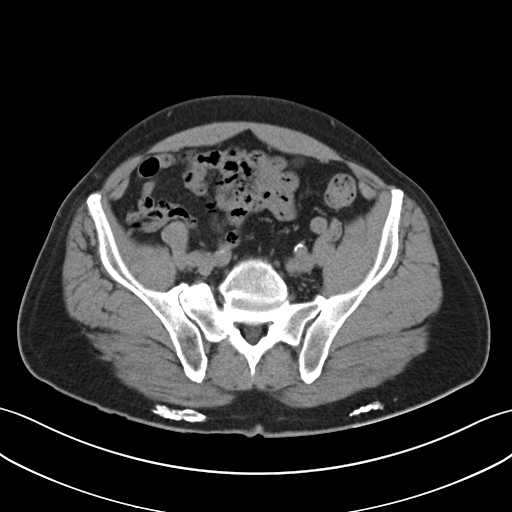
[im 41/92  soft-tissue]
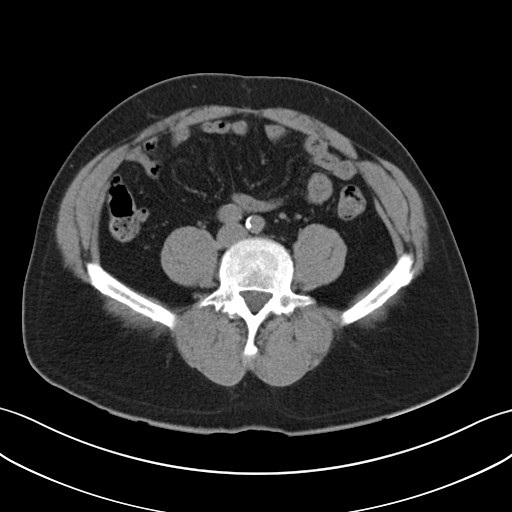
[im 46/92  soft-tissue]
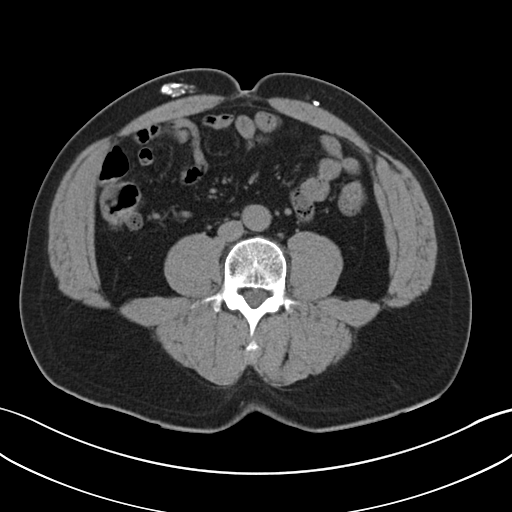
[im 51/92  soft-tissue]
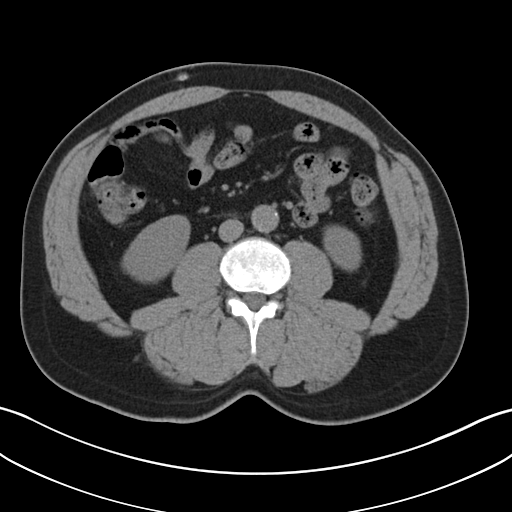
[im 61/92  soft-tissue]
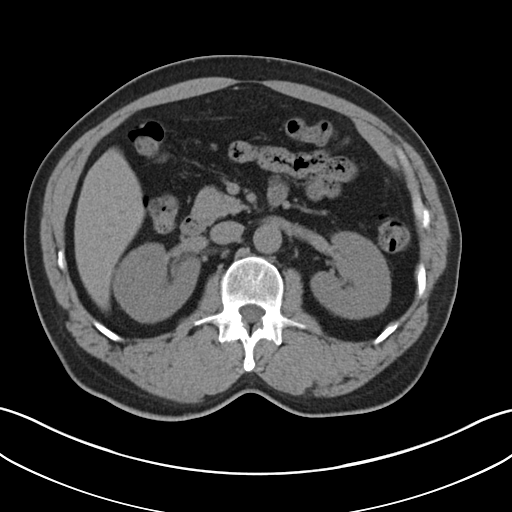
[im 61/92  bone]
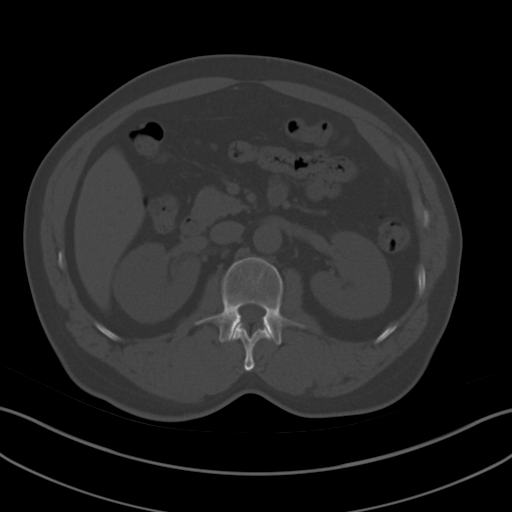
[im 66/92  soft-tissue]
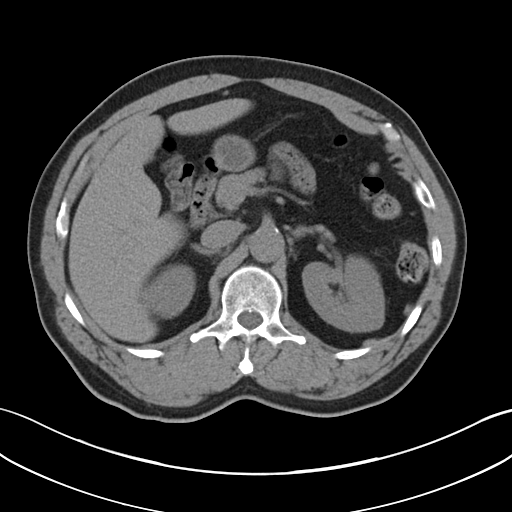
[im 71/92  soft-tissue]
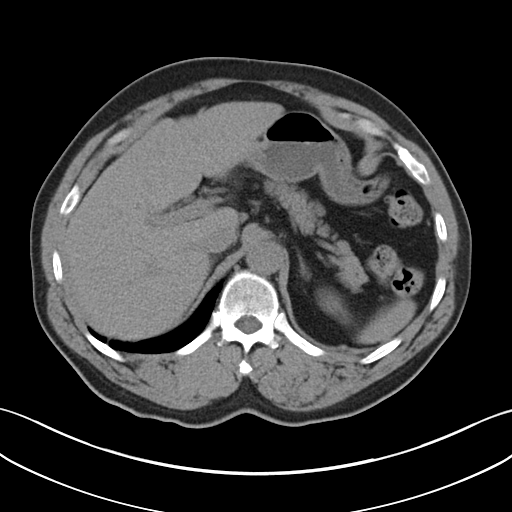
[im 81/92  soft-tissue]
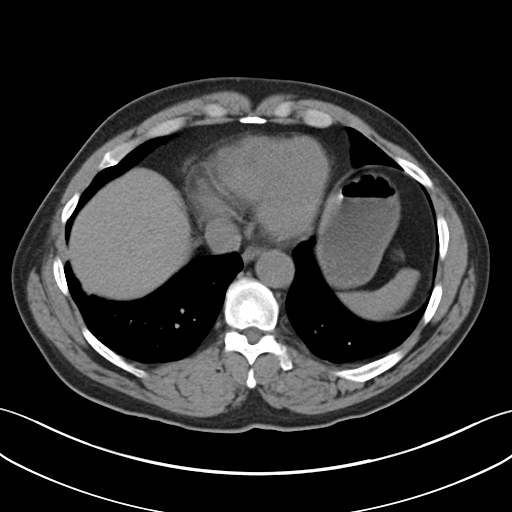
[im 86/92  soft-tissue]
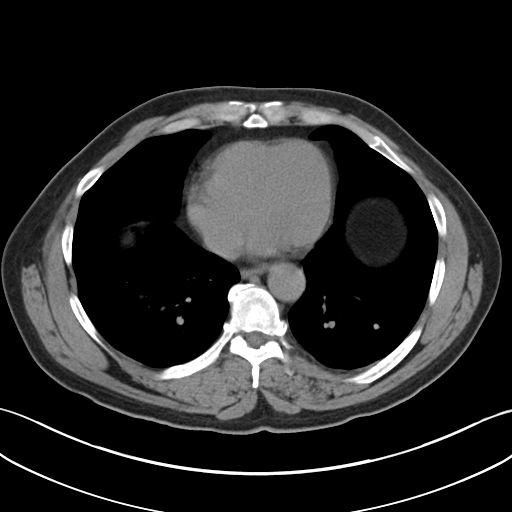

[Series 5: coronal · coronal · 0.74mm/px · 3 of 139 slices shown]
[im 47/139  soft-tissue]
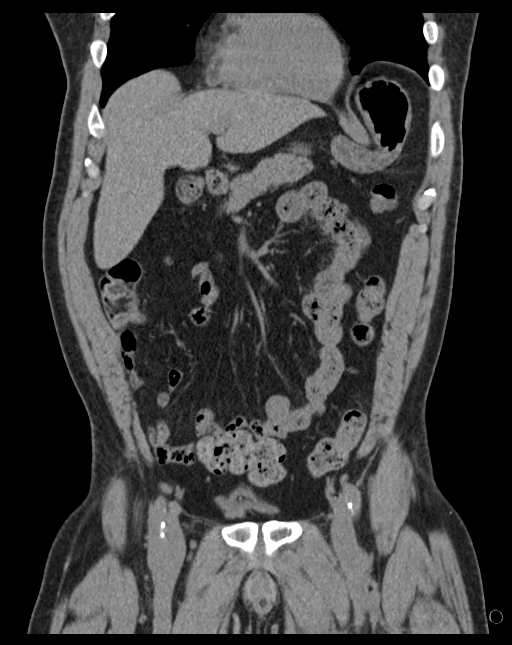
[im 62/139  soft-tissue]
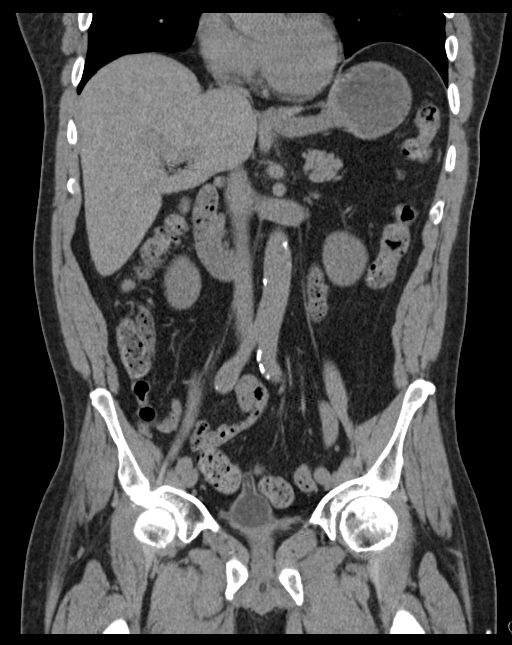
[im 77/139  soft-tissue]
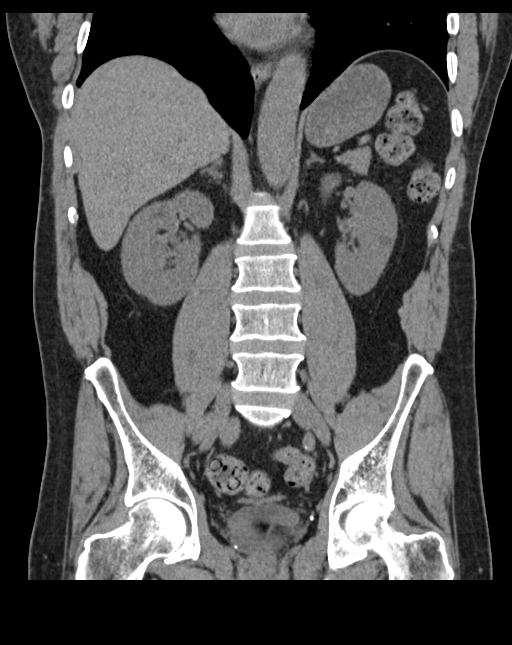

[16 of 46 positions shown; findings below may reference images not displayed]

FINDINGS: Lower chest: No acute abnormality.

Hepatobiliary: No focal liver abnormality is seen. Status post
cholecystectomy. No biliary dilatation.

Pancreas: Unremarkable. No pancreatic ductal dilatation or
surrounding inflammatory changes.

Spleen: Normal in size without focal abnormality.

Adrenals/Urinary Tract: Adrenal glands are unremarkable. Kidneys are
normal, without renal calculi, focal lesion, or hydronephrosis.
Bladder is decompressed around a Foley catheter.

Stomach/Bowel: Stomach is within normal limits. Appendix is normal.
No evidence of bowel wall thickening, distention, or inflammatory
changes.

Vascular/Lymphatic: The abdominal aorta is normal in caliber. Mild
atherosclerotic calcification. No adenopathy in the abdomen or
pelvis.

Reproductive: Unremarkable

Other: No acute inflammation.  No ascites.

Musculoskeletal: No significant skeletal lesion.
IMPRESSION: No significant abnormality

## 2018-04-09 IMAGING — CR DG ABDOMEN ACUTE W/ 1V CHEST
3 series · 3 of 3 positions shown · non-contrast
Comparison: Prior MRI from 10/14/2016 as well as previous
radiograph from 02/15/2016.

CLINICAL DATA: Initial evaluation for acute generalized abdominal
pain.

EXAM:
DG ABDOMEN ACUTE W/ 1V CHEST

[w chest pa]
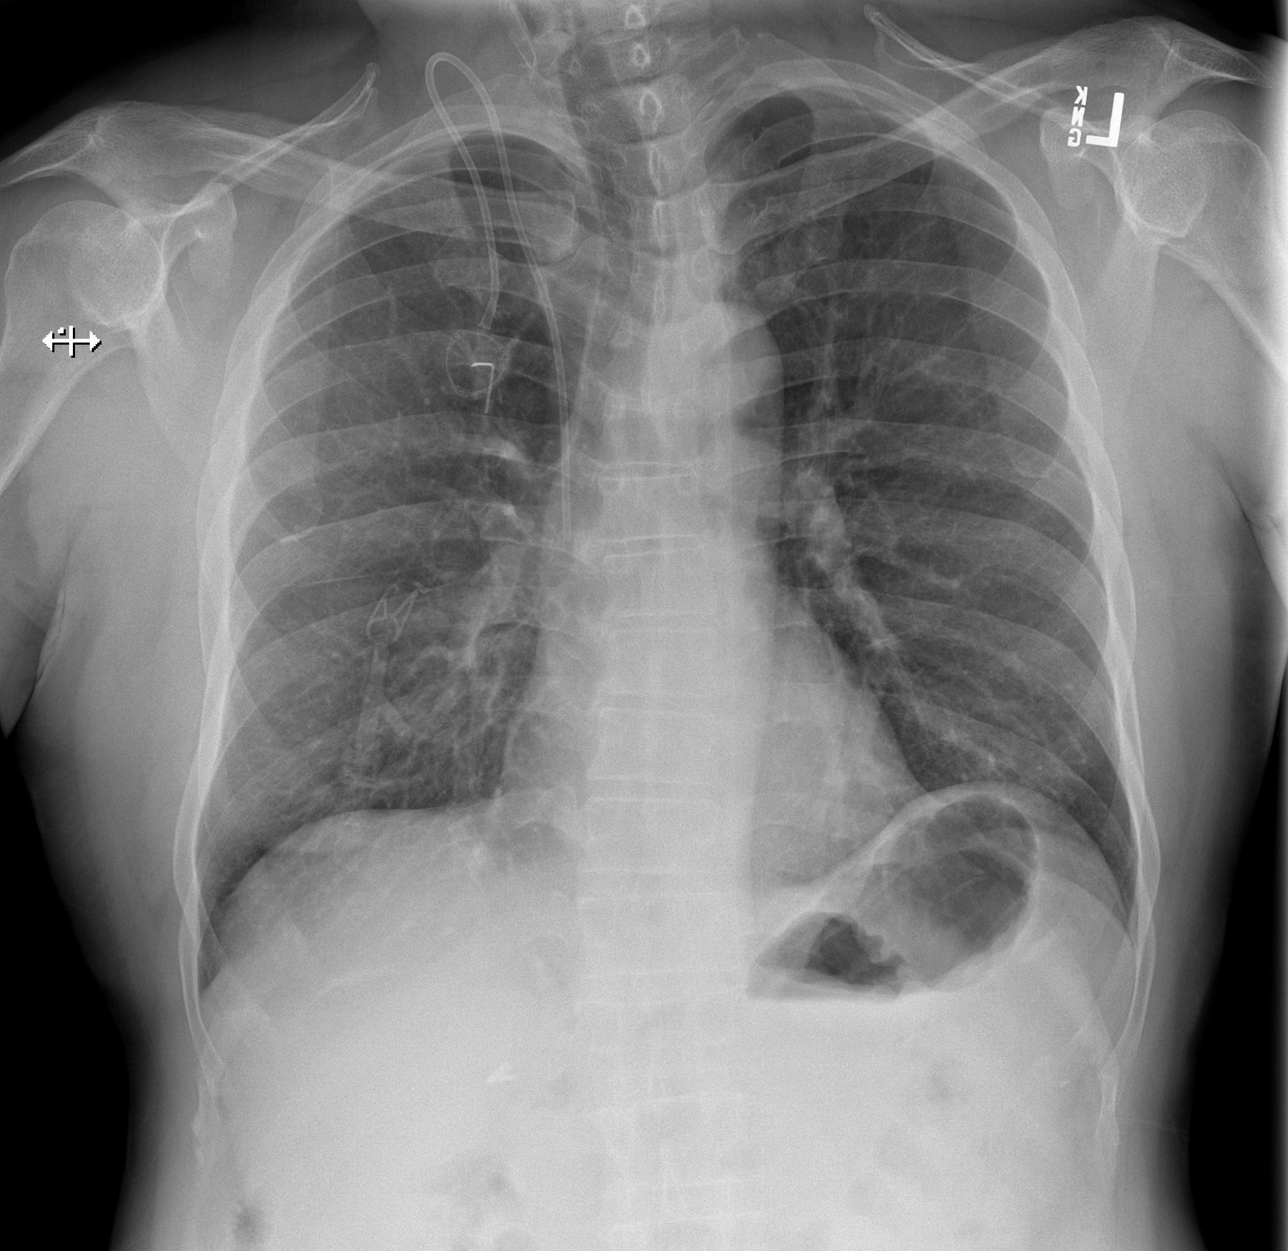

[w abdomen upright]
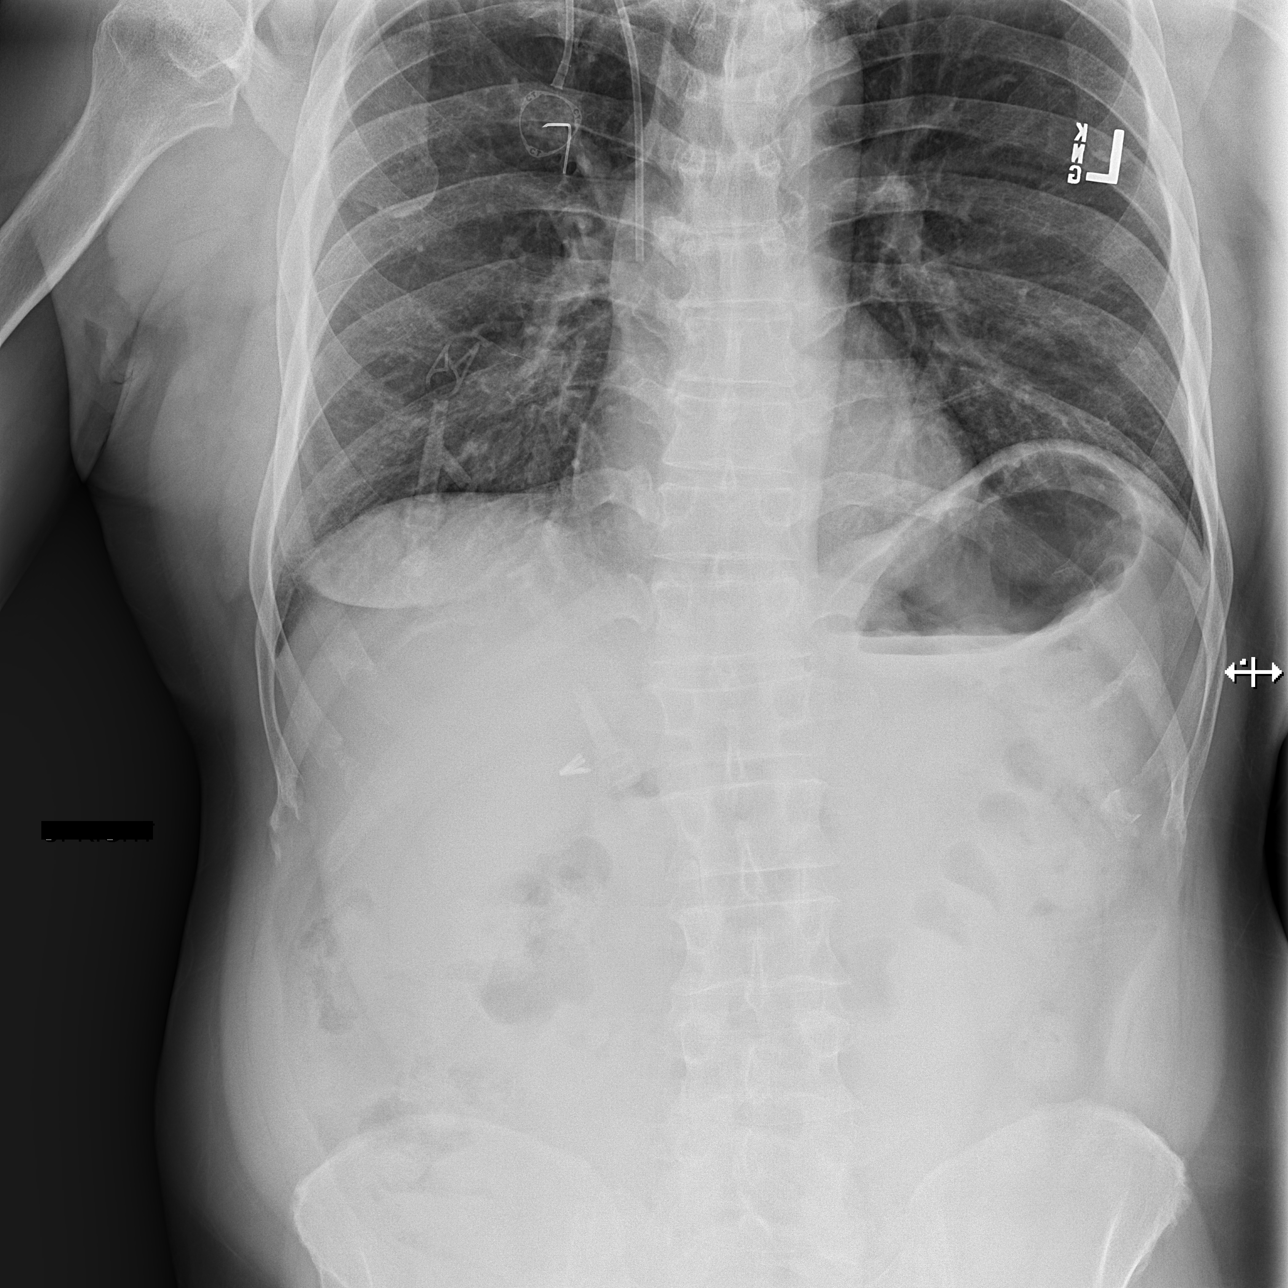

[t abdomen supine]
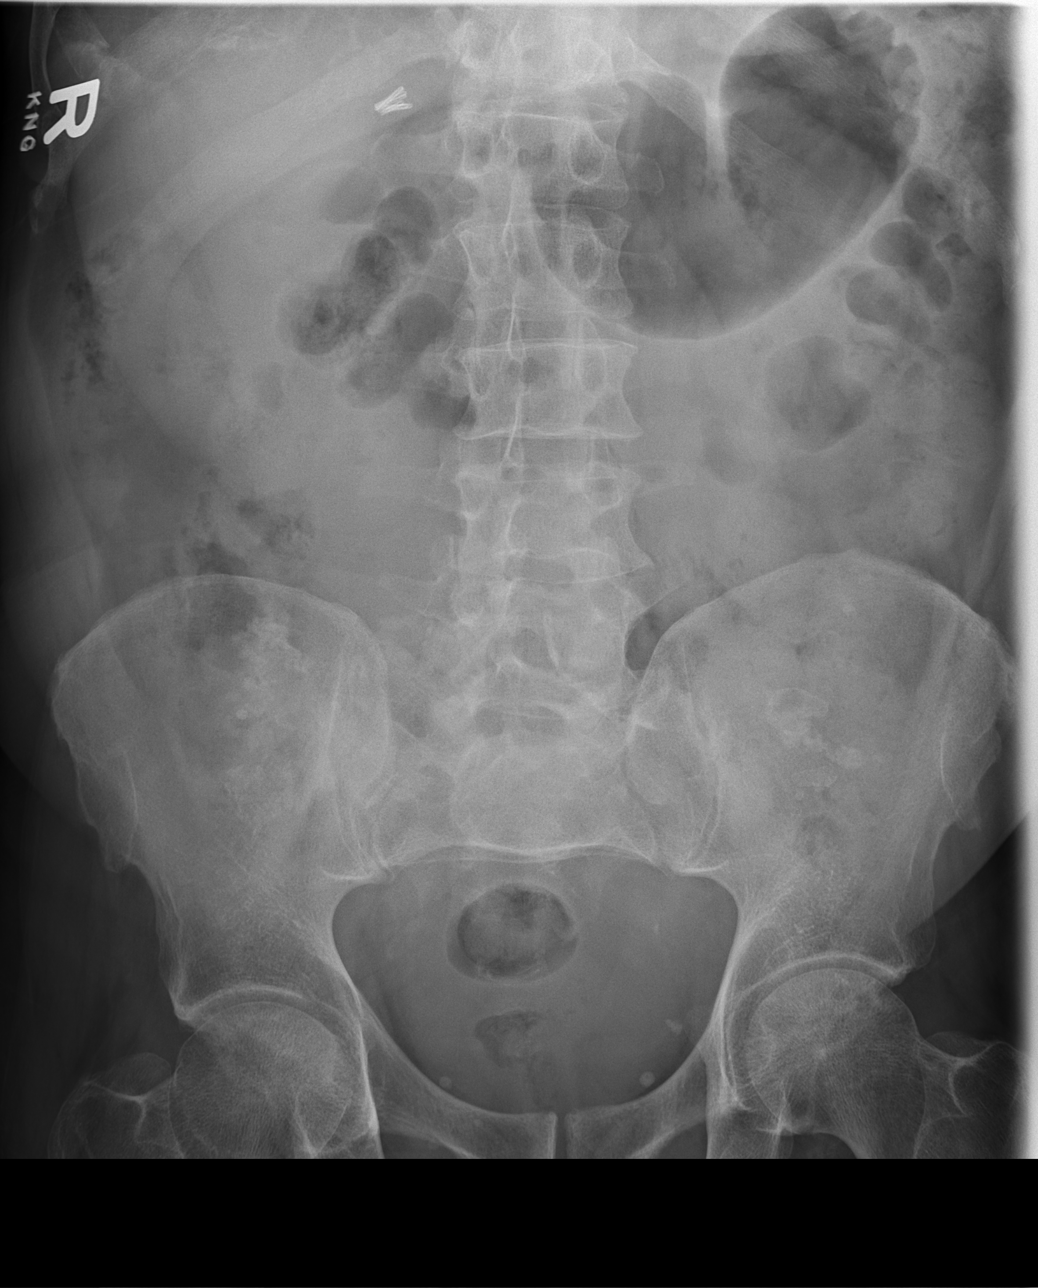

[3 of 3 positions shown; findings below may reference images not displayed]

FINDINGS: Accessed right Port-A-Cath in place with tip overlying the
cavoatrial junction. Cardiac and mediastinal silhouettes within
normal limits.

Mild elevation left hemidiaphragm. No focal infiltrates. No
pulmonary edema or pleural effusion. No pneumothorax.

Bowel gas pattern within normal limits without evidence for
obstruction or ileus. Mild gaseous distension of the stomach. No
abnormal bowel wall thickening. Moderate amount of retained stool
within the colon. No soft tissue mass. Patient is status post
cholecystectomy. Scattered vascular calcifications noted.

Degenerative changes noted within the spine.
IMPRESSION: 1. Mild gaseous distension of the stomach.
2. Otherwise nonobstructive bowel gas pattern with no radiographic
evidence for acute intra-abdominal process.
3. Moderate amount of retained stool throughout the colon.
4. Mild elevation left hemidiaphragm. No other active
cardiopulmonary disease identified.

## 2018-04-09 MED ORDER — RISPERIDONE 0.25 MG PO TABS: 0.2500 mg | ORAL_TABLET | Freq: Every day | ORAL | 0 refills | Status: AC

## 2018-04-09 MED ORDER — HEPARIN SOD (PORK) LOCK FLUSH 100 UNIT/ML IV SOLN
500.0000 [IU] | INTRAVENOUS | Status: AC | PRN
  Administered 2018-04-09: 500 [IU]

## 2018-04-09 MED ORDER — LEVETIRACETAM 500 MG PO TABS: 500.0000 mg | ORAL_TABLET | Freq: Two times a day (BID) | ORAL | 0 refills | Status: DC

## 2018-04-09 MED ORDER — OXYCODONE-ACETAMINOPHEN 10-325 MG PO TABS: 1.0000 | ORAL_TABLET | Freq: Three times a day (TID) | ORAL | 0 refills | Status: AC | PRN

## 2018-04-09 NOTE — Care Management Note (Signed)
Case Management Note  Patient Details  Name: Rylyn Dyas MRN: [id number] Date of Birth: [birth year]  Subjective/Objective:     Drug overdose              Encarnacion Chu (Mother) Kenechi Sheffler ([phone]     PCP: Dr. Concepcion Elk  Action/Plan: Pt cleared by psych and will transition to home with mom and girlfriend.  Pt states cousin to provide transportation to home.  Expected Discharge Date:  04/09/18               Expected Discharge Plan:    In-House Referral:  Clinical Social Work  Discharge planning Services  CM Consult  Post Acute Care Choice:    Choice offered to:     Status of Service:  Completed, signed off  If discussed at [employer] of Stay Meetings, dates discussed:    Additional Comments:  Epifanio Lesches, RN 04/09/2018, 12:18 PM

## 2018-04-09 NOTE — Discharge Summary (Signed)
Physician Discharge Summary  [name] ZOX:[id number] DOB: February 02, 1961 DOA: 03/17/2018  PCP: Fleet Contras, MD  Admit date: 03/17/2018 Discharge date: 04/09/2018  Time spent:  Recommendations for Outpatient Follow-up:  1. PCP in 1 week' 2. Psych Referral to [facility] [place] where pt can walk in for first appt 3. Neurology FU with Dr.Jeffrey for MS   Discharge Diagnoses:  Principal Problem:   Drug overdose Active Problems:   PULMONARY EMBOLISM, HX OF   Multiple sclerosis (HCC)   DVT (deep venous thrombosis) (HCC)   GERD (gastroesophageal reflux disease)   Essential hypertension   Chronic pain syndrome   Major depressive disorder, recurrent episode (HCC)   Overdose by acetaminophen, accidental or unintentional, initial encounter   Suicide attempt [facility])   Discharge Condition: stable  Diet recommendation: heart healthy  Filed Weights   04/07/18 0549 04/08/18 0613 04/09/18 0558  Weight: 81.4 kg (179 lb 6.4 oz) 81.1 kg (178 lb 12.7 oz) 80.6 kg (177 lb 11.1 oz)    History of present illness:  57 year old male with PMH as below, which is significant for MS, Chronic pain, Depression, DVT, and gait abnormality. His family noted him to be minimally responsive on the evening hours of 5/8 and called EMS. Upon their arrival the patient had agonal respirations, remained somnolent upon arrival to ED and was given IV narcan.  Hospital Course:   Overdose by acetaminophen, opioids,  initial encounter Patient had arrived to ED with minimal responsiveness and agonal respirations.  -patient had reported taking 15 tablets of Percocet, denies SI Admitted to ICU by CCM, started on narcan infusion, mentation improved.  -tylenol at 138 on admission, treated with NAC. Tylenol level less than 10 subsequently.  -Psych consulted, who recommended inpatient psychiatric facility admission.  -he has remained stable inpatient for 21days, psych re-evaluated him yesterday afternoon  and felt he is now safe to go home with outpatient Psych FU -Seen by social worker and arrangements made for FU  History of DVT and PE;  -prior H/o VTE and has been on DVT prophylaxis dose of Xarelto since 2018, will defer to PCP at FU  MS;  - MRI of the brain and C-spine negative for new enhancements or lesions. Dr.Rai d/w NEuro Dr.Arora on 5/13, No need for IV steroids - Wheelchair bound  - Placed on baclofen 20 mg 3 times daily, he also receives Ocrevus infusion every 6 months, followed by neurology outpatient Dr. Tinnie Gens. -Continue Keppra, Trileptal per home regimen  GERD; PPI   Right shoulder pain, chronic pain syndrome X ray was negative for fracture.   Bilateral PNA;  -resolved -earlier this admission chest x ray showed multilobar PNA.  -possibly aspiration related when he was unresponsive -completed Abx course  Migraine headaches Placed back on Topamax, Imitrex as  Consultants:   CCM  Psych    Procedures:  MRI brain C-spine        Discharge Exam: Vitals:   04/09/18 0558 04/09/18 1316  BP: 104/67 106/71  Pulse: 71 75  Resp: 18 16  Temp: 98 F (36.7 C) 98.1 F (36.7 C)  SpO2: 98% 98%    General: AAOx3 Cardiovascular: S1S2/RRR Respiratory: CTAB  Discharge Instructions   Discharge Instructions    Diet - low sodium heart healthy   Complete by:  As directed    Increase activity slowly   Complete by:  As directed      Allergies as of 04/09/2018      Reactions   Acyclovir And  Related       Medication List    STOP taking these medications   dalfampridine 10 MG Tb12   doxycycline 100 MG capsule Commonly known as:  VIBRAMYCIN   metroNIDAZOLE 500 MG tablet Commonly known as:  FLAGYL   rifaximin 550 MG Tabs tablet Commonly known as:  XIFAXAN   verapamil 80 MG tablet Commonly known as:  CALAN     TAKE these medications   baclofen 20 MG tablet Commonly known as:  LIORESAL Take 1 tablet (20 mg total) by mouth 4 (four)  times daily. For muscle spasms   gabapentin 600 MG tablet Commonly known as:  NEURONTIN Take 600 mg by mouth 3 (three) times daily.   hydrOXYzine 25 MG tablet Commonly known as:  ATARAX/VISTARIL Take 25 mg by mouth 2 (two) times daily.   levETIRAcetam 500 MG tablet Commonly known as:  KEPPRA Take 1 tablet (500 mg total) by mouth 2 (two) times daily.   OCREVUS IV Inject into the vein every 6 (six) months.   omeprazole 40 MG capsule Commonly known as:  PRILOSEC Take 1 capsule (40 mg total) by mouth every evening. For acid reflux What changed:    how much to take  additional instructions   Oxcarbazepine 300 MG tablet Commonly known as:  TRILEPTAL Take 1 tablet (300 mg total) by mouth 3 (three) times daily. For mood stabilization   oxyCODONE-acetaminophen 10-325 MG tablet Commonly known as:  PERCOCET Take 1 tablet by mouth every 8 (eight) hours as needed for pain. What changed:  when to take this   risperiDONE 0.25 MG tablet Commonly known as:  RISPERDAL Take 1 tablet (0.25 mg total) by mouth at bedtime.   sertraline 50 MG tablet Commonly known as:  ZOLOFT Take 1 tablet (50 mg total) by mouth daily. For depression   topiramate 100 MG tablet Commonly known as:  TOPAMAX Take 100 mg by mouth at bedtime.   traZODone 100 MG tablet Commonly known as:  DESYREL Take 100 mg by mouth at bedtime as needed for sleep.   triamterene-hydrochlorothiazide 37.5-25 MG capsule Commonly known as:  DYAZIDE Take 1 each (1 capsule total) by mouth daily. For high blood pressure   XARELTO 10 MG Tabs tablet Generic drug:  rivaroxaban Take 1 tablet (10 mg total) by mouth daily. For blood clot prevention What changed:    when to take this  additional instructions      Allergies  Allergen Reactions  . Acyclovir And Related    Follow-up Information    [facility] Go in 1 day(s).   Specialty:  Professional Counselor Why:  Walk in for the first appoinment  (M-F, closed between12-1pm). Contact information: [address] [place] [address] [phone]        Fleet Contras, MD. Schedule an appointment as soon as possible for a visit in 1 week(s).   Specialty:  Internal Medicine Contact information: [address] [place] [address] [phone]            The results of significant diagnostics from this hospitalization (including imaging, microbiology, ancillary and laboratory) are listed below for reference.    Significant Diagnostic Studies: Dg Shoulder Right  Result Date: 03/19/2018 CLINICAL DATA:  Pain after a fall.  Multiple sclerosis. EXAM: RIGHT SHOULDER - 2+ VIEW COMPARISON:  None. FINDINGS: There is no evidence of fracture or dislocation. There is no evidence of arthropathy or other focal bone abnormality. Soft tissues are unremarkable. IMPRESSION: Negative. Electronically Signed  By: Elsie Stain M.D.   On: 03/19/2018 23:17   Mr Laqueta Jean WU Contrast  Result Date: 03/22/2018 CLINICAL DATA:  Multiple sclerosis.  New neurological event. EXAM: MRI HEAD WITHOUT AND WITH CONTRAST TECHNIQUE: Multiplanar, multiecho pulse sequences of the brain and surrounding structures were obtained without and with intravenous contrast. CONTRAST:  <See Chart> MULTIHANCE GADOBENATE DIMEGLUMINE 529 MG/ML IV SOLN COMPARISON:  MRI 12/20/2017 and 08/06/2017. FINDINGS: Brain: Diffusion imaging does not show any acute or subacute infarction or other cause of restricted diffusion. Scattered foci of T2 and FLAIR signal remain evident in the hemispheric white matter, with a nonspecific pattern. No new or progressive lesions. No lesions show contrast enhancement. No cortical abnormality. No mass lesion, hemorrhage, hydrocephalus or extra-axial collection. 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 This is unchanged as far back as 2010. Vascular: Major  vessels at the base of the brain show flow. Skull and upper cervical spine: Negative Sinuses/Orbits: Clear/normal Other: None IMPRESSION: Stable appearance of the brain. Few scattered punctate nonspecific T2 and FLAIR bright foci of the hemispheric white matter. No new or progressive lesions. No abnormal contrast enhancement. 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 Electronically Signed   By: Paulina Fusi M.D.   On: 03/22/2018 16:13   Mr Cervical Spine W Wo Contrast  Result Date: 03/22/2018 CLINICAL DATA:  Multiple sclerosis.  New neurological event. EXAM: MRI CERVICAL SPINE WITHOUT AND WITH CONTRAST TECHNIQUE: Multiplanar and multiecho pulse sequences of the cervical spine, to include the craniocervical junction and cervicothoracic junction, were obtained without and with intravenous contrast. CONTRAST:  20 cc MultiHance COMPARISON:  12/20/2017.  08/06/2017 FINDINGS: Alignment: Normal Vertebrae: Normal Cord: Multiple foci of increased T2 signal are again demonstrated in the cord beginning at C2-3 and extending as low as T1-2. Today's study shows less motion degradation in the lesions are more clearly depicted, but I think they are similar in number and distribution. None show abnormal contrast enhancement. Posterior Fossa, vertebral arteries, paraspinal tissues: See results of brain MRI. Disc levels: Degenerative spondylosis at C3-4, C4-5 and C5-6 with narrowing of the canal but no cord compression. Right foraminal stenosis at C3-4 and left foraminal stenosis at C3-4 and C5-6. IMPRESSION: Today's study is better exam, with less motion degradation. Multiple cervical spine and upper thoracic cord lesions are more clearly depicted, consistent with chronic demyelinating disease. In reviewing multiple prior exams, I do not think that there are new or visibly progressive lesions. None show contrast enhancement. Degenerative spondylosis from C3-4 through C5-6 with foraminal narrowing  bilaterally at C3-4 and on the left at C5-6. Electronically Signed   By: Paulina Fusi M.D.   On: 03/22/2018 16:08   Dg Chest Port 1 View  Result Date: 03/20/2018 CLINICAL DATA:  Fever EXAM: PORTABLE CHEST 1 VIEW COMPARISON:  03/04/2018 FINDINGS: Hazy airspace disease in bilateral upper lobes and bilateral lower lobes most severe in the right lower lobe most concerning for multilobar pneumonia. No significant pleural effusion or pneumothorax. Stable cardiomediastinal silhouette. Right-sided Port-A-Cath with the tip projecting over the cavoatrial junction. No acute osseous abnormality. IMPRESSION: Findings concerning for multilobar pneumonia. Electronically Signed   By: Elige Ko   On: 03/20/2018 13:22    Microbiology: No results found for this or any previous visit (from the past 240 hour(s)).   Labs: Basic Metabolic Panel: No results for input(s): NA, K, CL, CO2, GLUCOSE, BUN, CREATININE,  CALCIUM, MG, PHOS in the last 168 hours. Liver Function Tests: No results for input(s): AST, ALT, ALKPHOS, BILITOT, PROT, ALBUMIN in the last 168 hours. No results for input(s): LIPASE, AMYLASE in the last 168 hours. No results for input(s): AMMONIA in the last 168 hours. CBC: No results for input(s): WBC, NEUTROABS, HGB, HCT, MCV, PLT in the last 168 hours. Cardiac Enzymes: No results for input(s): CKTOTAL, CKMB, CKMBINDEX, TROPONINI in the last 168 hours. BNP: BNP (last 3 results) No results for input(s): BNP in the last 8760 hours.  ProBNP (last 3 results) No results for input(s): PROBNP in the last 8760 hours.  CBG: Recent Labs  Lab 04/03/18 2008  GLUCAP 104*       Signed:  Zannie Cove MD.  [facility] 04/09/2018, 3:24 PM

## 2018-04-09 NOTE — Progress Notes (Signed)
Patient was discharged home by MD order; discharged instructions  review and give to patient with care notes and prescriptions; IV DIC; skin intact; patient will be escorted to the car by nurse  via wheelchair.  

## 2018-04-09 NOTE — Progress Notes (Addendum)
IVC rescinded.   CSW aware patient has been cleared for discharge home. CSW attached info on After Visit Summary for [facility] the [place] where patient can walk in for the first appointment.  CSW signing off.  Osborne Casco Amarrah Meinhart LCSW [phone]

## 2018-04-10 IMAGING — MR MR HEAD W/O CM
8 of 9 series · 33 of 48 positions shown · non-contrast
Comparison: MRI head 10/14/2016

CLINICAL DATA: Multiple sclerosis

EXAM:
MRI HEAD WITHOUT CONTRAST
TECHNIQUE: Multiplanar, multiecho pulse sequences of the brain and surrounding
structures were obtained without intravenous contrast.

[Series 3: T1 · sagittal · 5.0mm · 0.47mm/px · 1 of 24 slices shown]
[im 1/24]
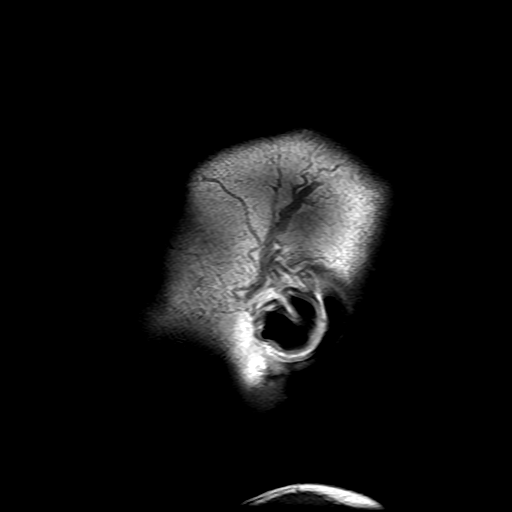

[Series 4: DWI · axial · 3.0mm · 1.09mm/px · z∈[-54,+93]mm · 8 of 106 slices shown (1 of 4)]
[im 1/106]
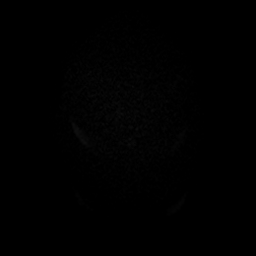
[im 16/106]
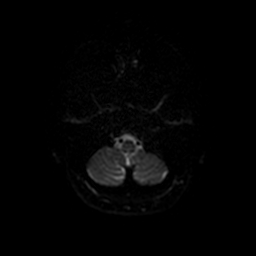
[im 31/106]
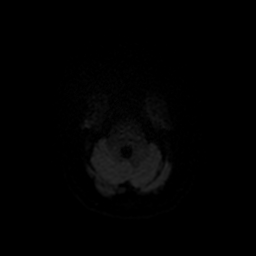
[im 46/106]
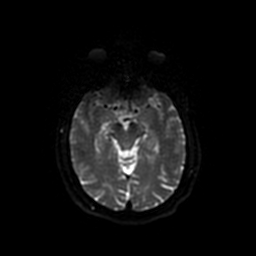
[im 61/106]
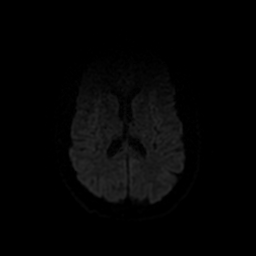
[im 76/106]
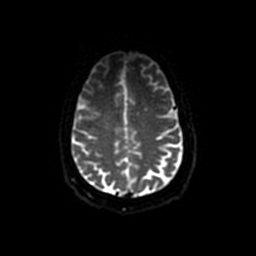
[im 91/106]
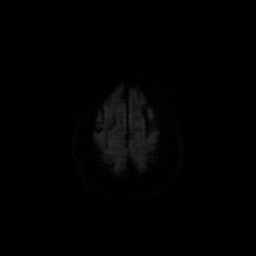
[im 106/106]
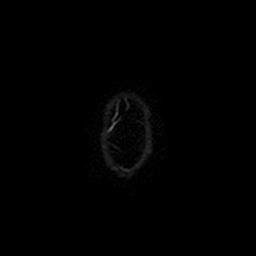

[Series 5: DWI · coronal · 5.0mm · 1.09mm/px · 5 of 68 slices shown (2 of 4)]
[im 1/68]
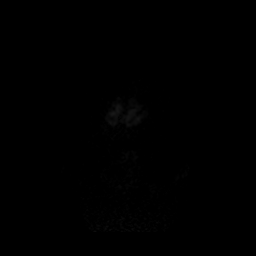
[im 17/68]
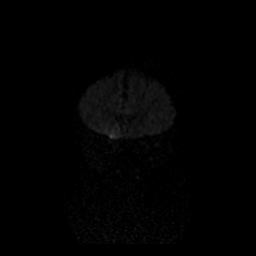
[im 34/68]
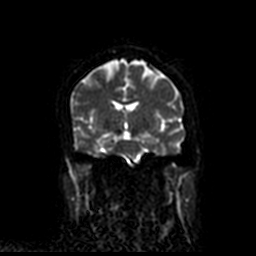
[im 51/68]
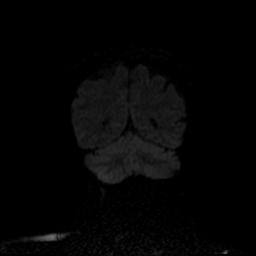
[im 68/68]
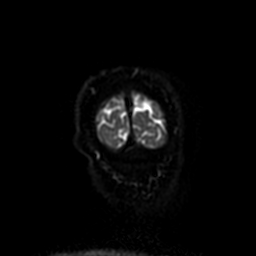

[Series 6: T2 · axial · 5.0mm · 0.43mm/px · z∈[-43,+120]mm · 2 of 27 slices shown]
[im 1/27]
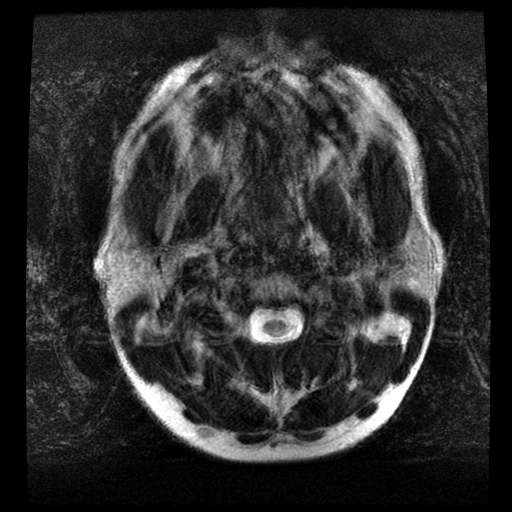
[im 27/27]
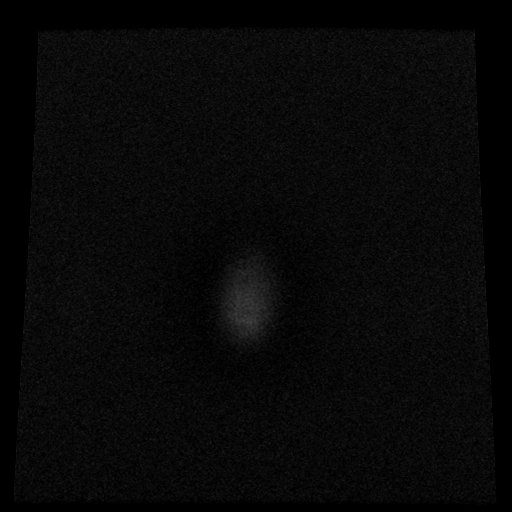

[Series 8: FLAIR · axial · 5.0mm · 0.43mm/px · z∈[-46,+122]mm · 2 of 30 slices shown (1 of 2)]
[im 1/30]
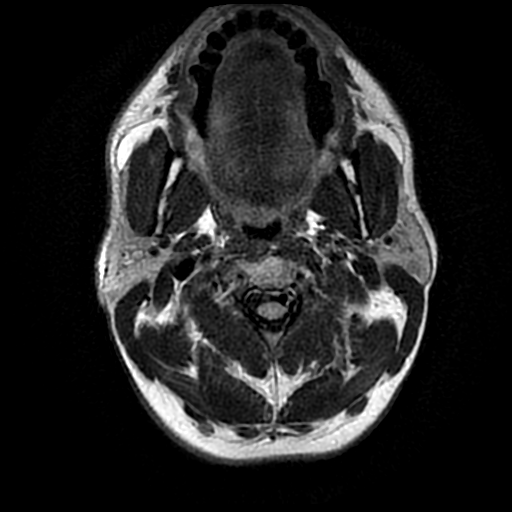
[im 30/30]
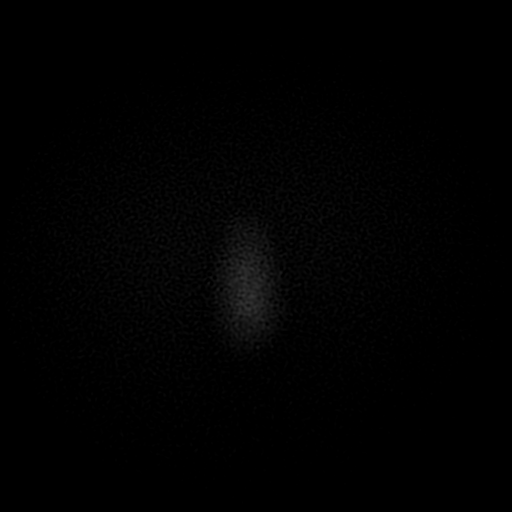

[Series 9: FLAIR · sagittal · 1.6mm · 0.47mm/px · 8 of 184 slices shown (2 of 2)]
[im 1/184]
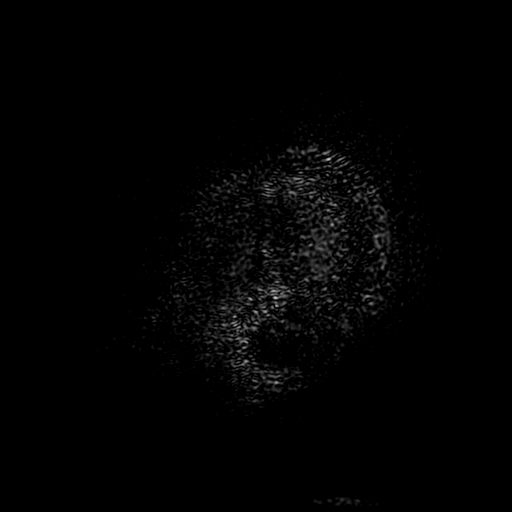
[im 29/184]
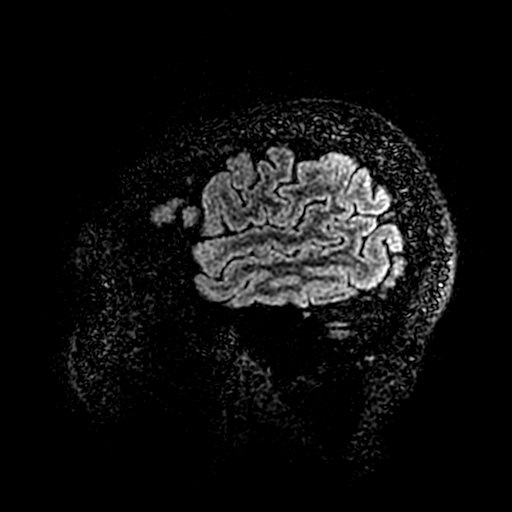
[im 57/184]
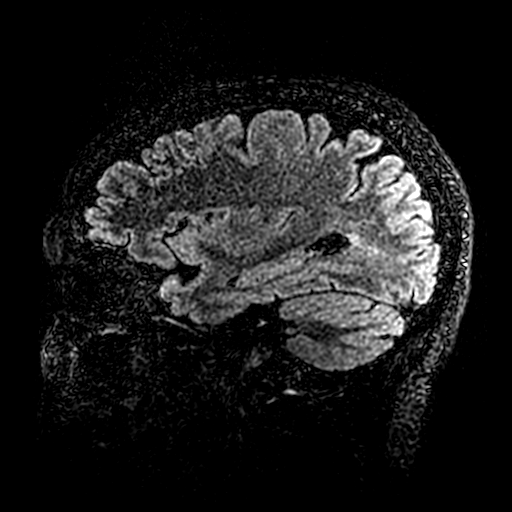
[im 85/184]
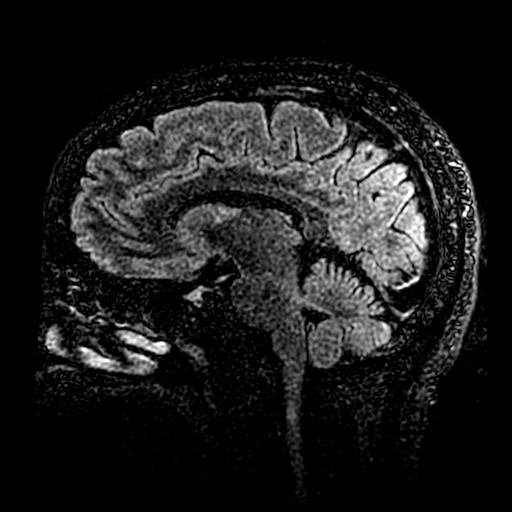
[im 99/184]
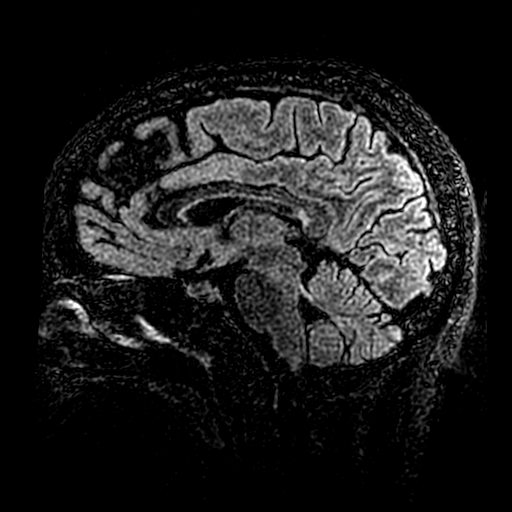
[im 127/184]
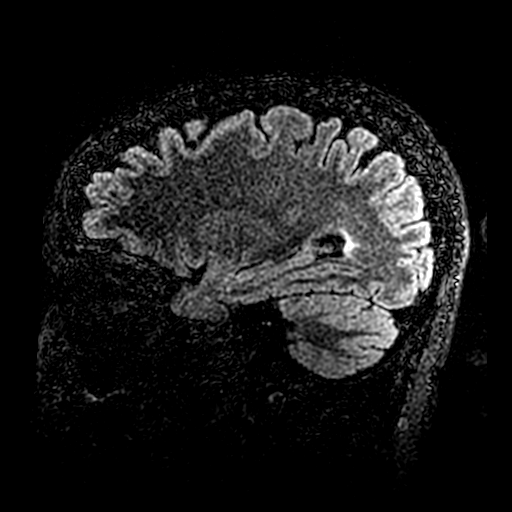
[im 155/184]
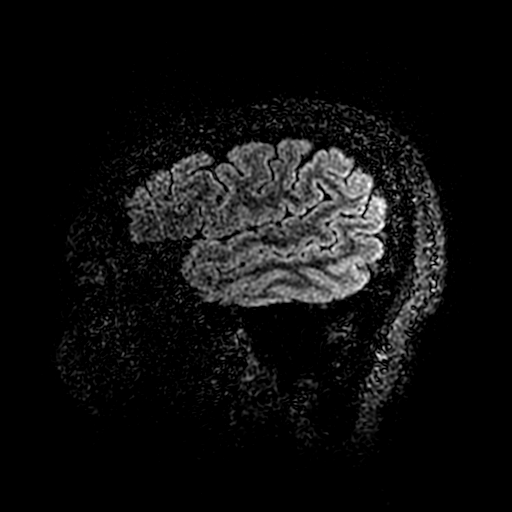
[im 184/184]
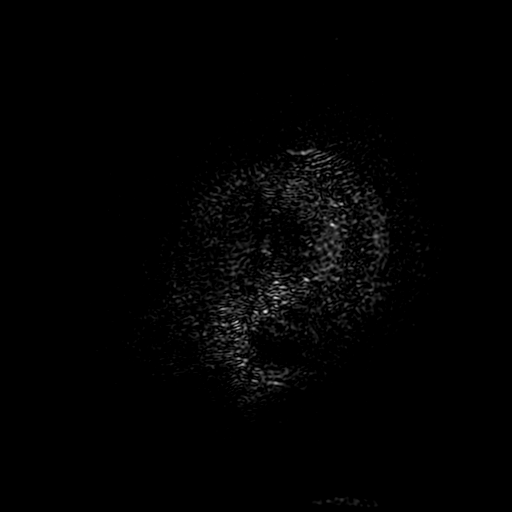

[Series 400: DWI · axial · 3.0mm · 1.09mm/px · z∈[-54,+93]mm · 4 of 53 slices shown (3 of 4)]
[im 1/53]
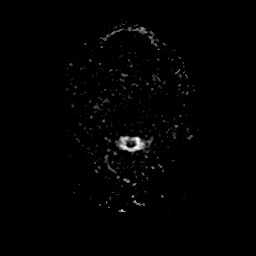
[im 18/53]
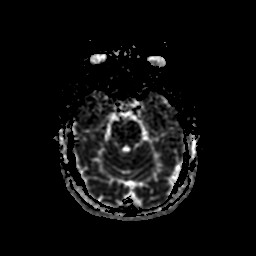
[im 35/53]
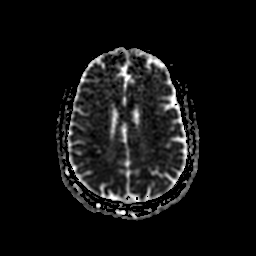
[im 53/53]
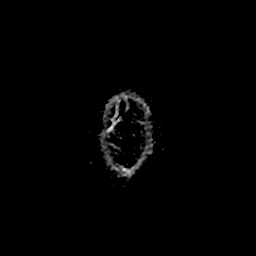

[Series 500: DWI · coronal · 5.0mm · 1.09mm/px · 3 of 34 slices shown (4 of 4)]
[im 1/34]
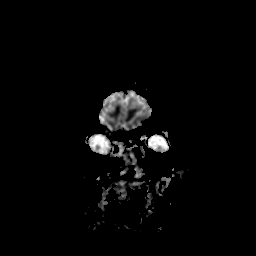
[im 17/34]
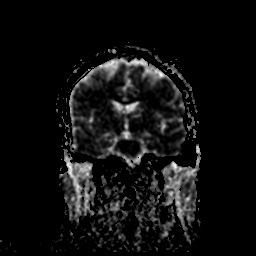
[im 34/34]
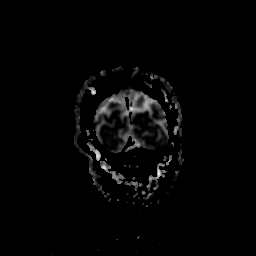

[33 of 48 positions shown; findings below may reference images not displayed]

FINDINGS: Brain: The majority of the study was performed albeit with motion.
The patient was not able to complete all the sequences. No contrast
was administered.

Ventricle size normal. Cerebral volume normal. Negative for acute
infarct. No areas of restricted diffusion.

Small periventricular and deep white matter hyperintensities
bilaterally unchanged from the prior study. Brainstem and cerebellum
normal. No mass lesion. Pituitary normal in size.

Vascular: Normal arterial flow voids.

Skull and upper cervical spine: Negative

Sinuses/Orbits: Negative

Other: None
IMPRESSION: Image quality degraded by motion. The patient was not able to
complete the study.

No change from 1 month ago. Small periventricular and deep white
matter hyperintensities are nonspecific and could be seen with
chronic microvascular ischemia, migraine headache, demyelinating
disease among other etiologies.

## 2018-04-11 IMAGING — MR MR CERVICAL SPINE W/O CM
4 of 5 series · 25 of 48 positions shown · non-contrast
Comparison: 11/16/2016 and brain MR. 10/04/2014 cervical spine MR.

CLINICAL DATA: 56-year-old male with history multiple sclerosis in
bilateral lower extremity weakness and urinary incontinence.
Questioned MS flare. Subsequent encounter.

EXAM:
MRI CERVICAL SPINE WITHOUT CONTRAST
TECHNIQUE: Multiplanar, multisequence MR imaging of the cervical spine was
performed. No intravenous contrast was administered.

[Series 2: T1 · sagittal · 3.0mm · 0.41mm/px · 6 of 12 slices shown]
[im 1/12]
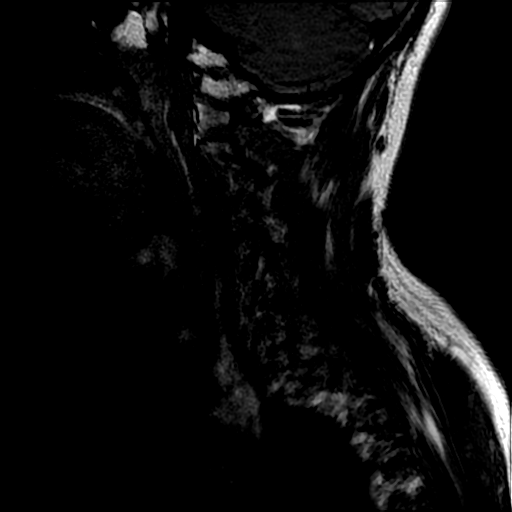
[im 3/12]
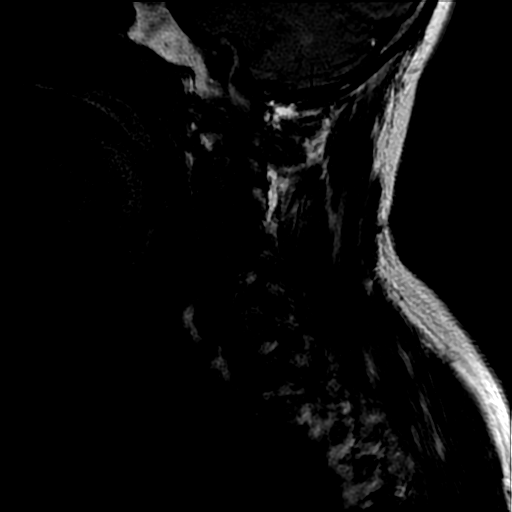
[im 5/12]
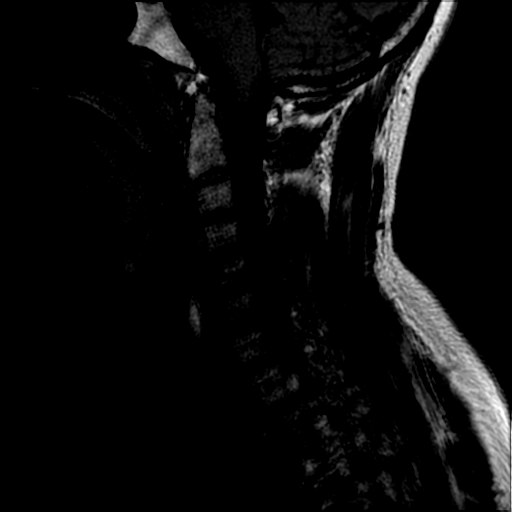
[im 7/12]
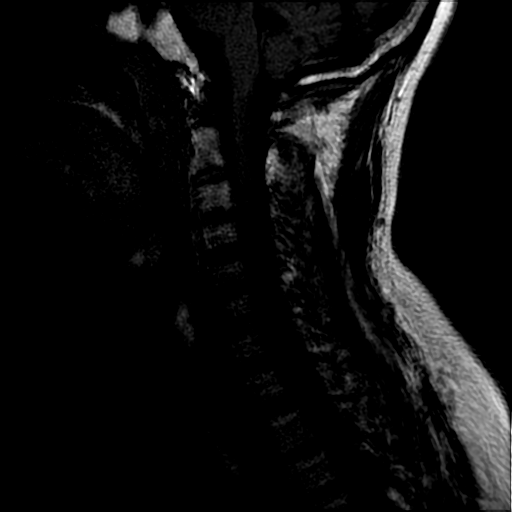
[im 9/12]
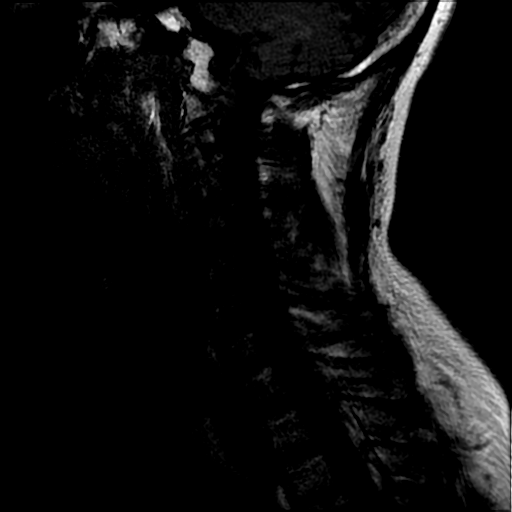
[im 12/12]
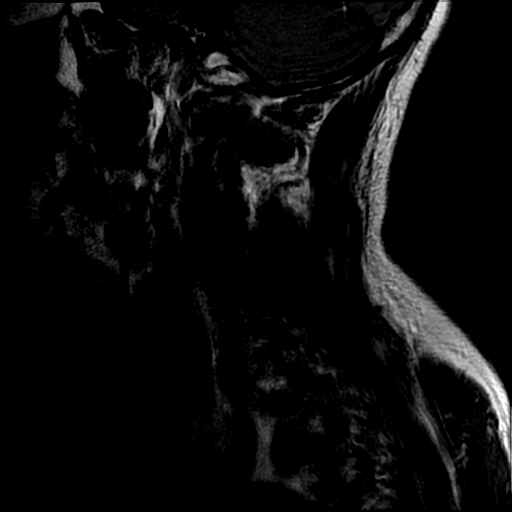

[Series 3: sag ir · sagittal · 3.0mm · 0.41mm/px · 4 of 12 slices shown]
[im 1/12]
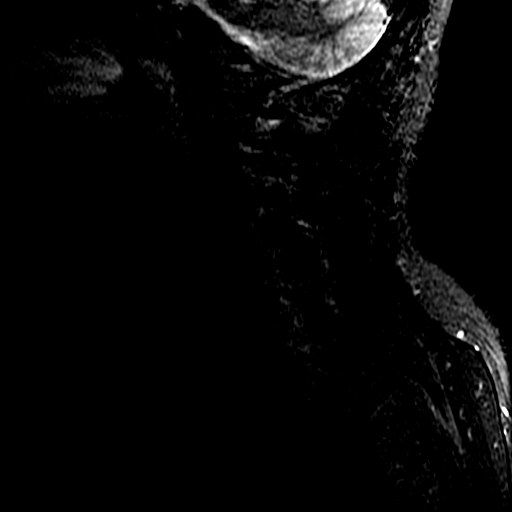
[im 3/12]
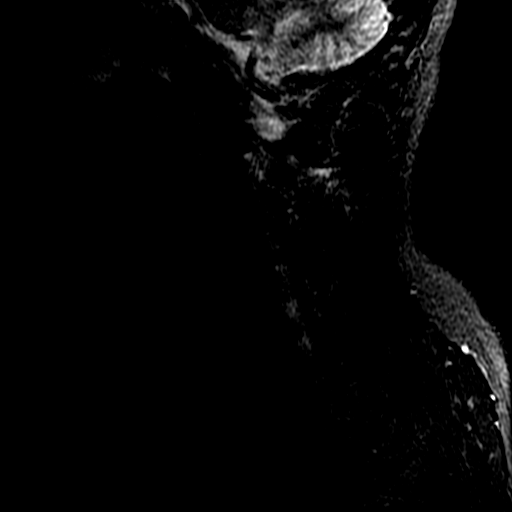
[im 7/12]
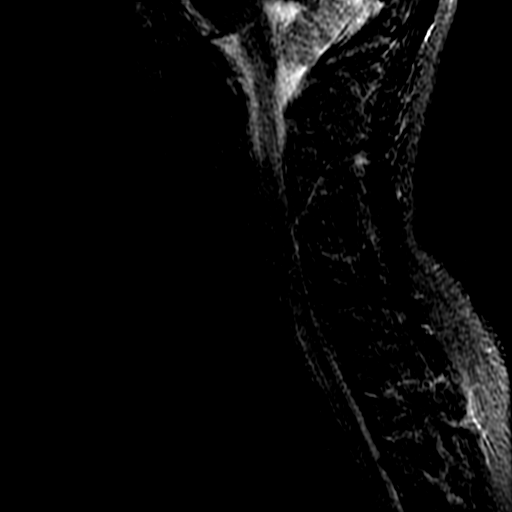
[im 12/12]
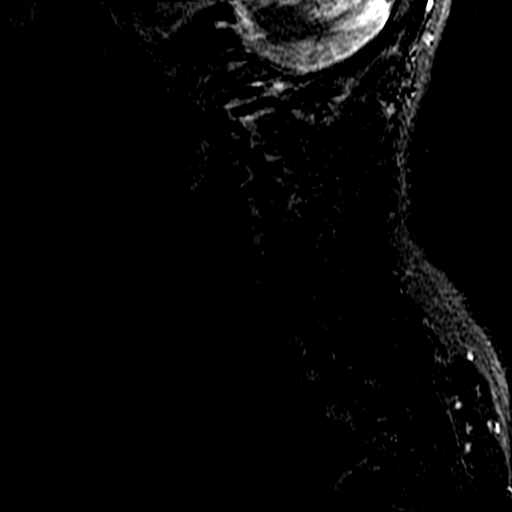

[Series 5: T2 · axial · 3.1mm · 0.70mm/px · z∈[-47,+52]mm · 9 of 29 slices shown]
[im 1/29]
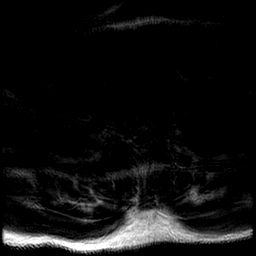
[im 5/29]
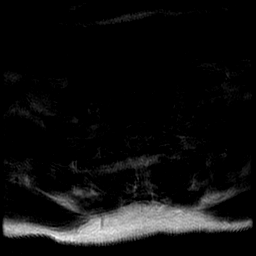
[im 9/29]
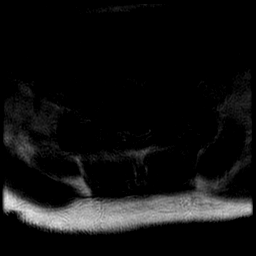
[im 13/29]
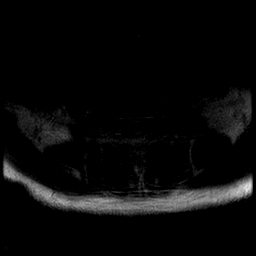
[im 15/29]
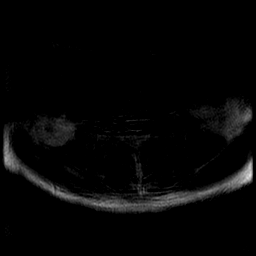
[im 17/29]
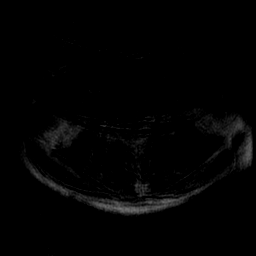
[im 21/29]
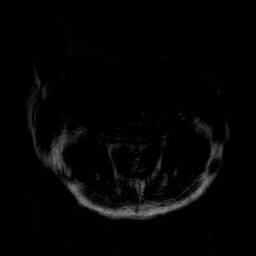
[im 25/29]
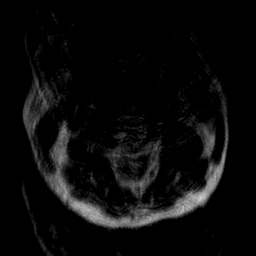
[im 29/29]
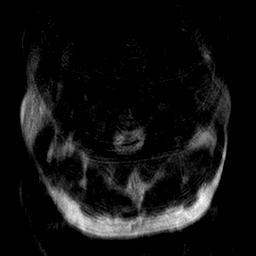

[Series 6: T2 post-contrast · sagittal · 3.0mm · 0.41mm/px · 6 of 12 slices shown]
[im 1/12]
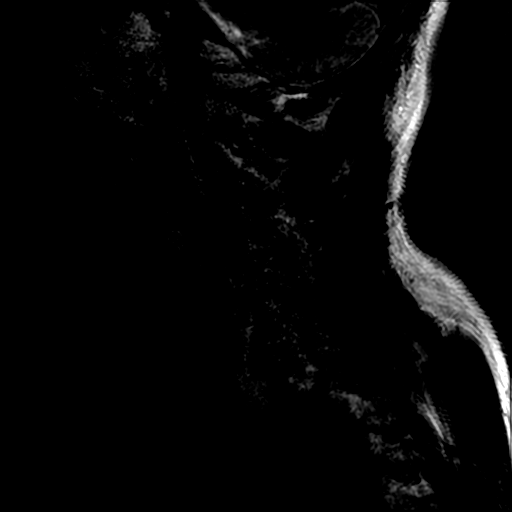
[im 3/12]
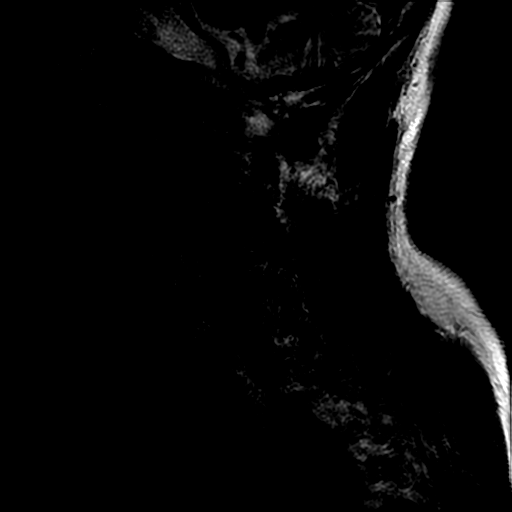
[im 5/12]
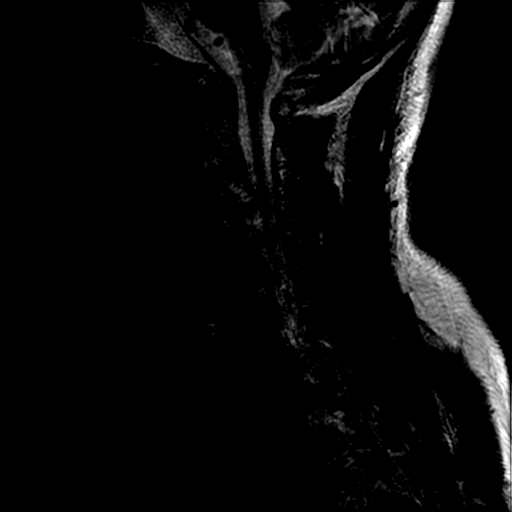
[im 7/12]
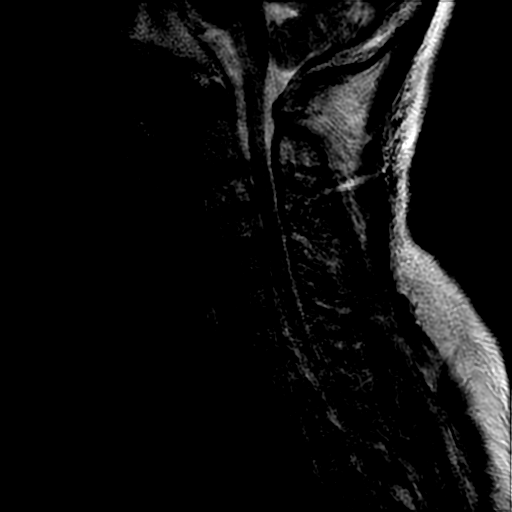
[im 9/12]
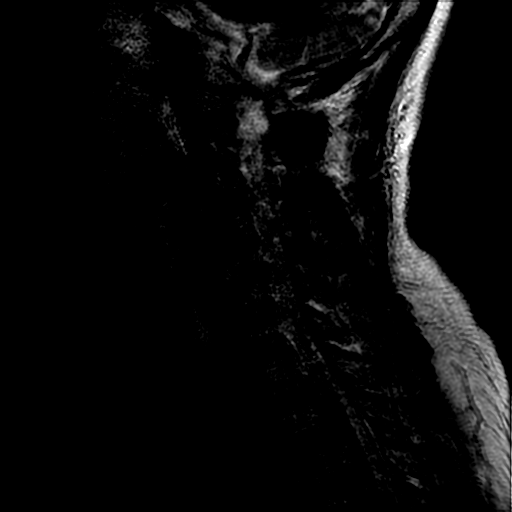
[im 12/12]
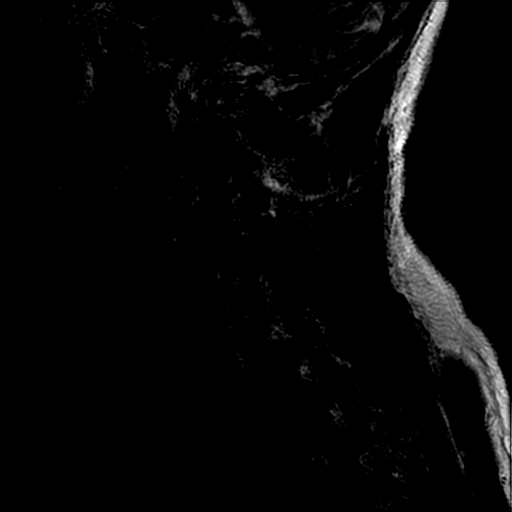

[25 of 48 positions shown; findings below may reference images not displayed]

FINDINGS: Exam is significantly motion degraded despite 2 attempts and Ativan.

Alignment: Within normal limits.

Vertebrae: No obvious osseous abnormality.

Cord: Previous exam was also motion degraded although less so than
on the present examination. On the prior exam areas of possible
demyelination of the cervical cord noted on the left from the C2
through upper C5 level and on the right at the C6, C6-7 level and T1
level. Secondary to motion degradation it is not possible to
determine if there has been an interval change.

Disc levels:

Cervical spondylotic changes C3-4 thru C5-6 once again noted but not
adequately assessed secondary to motion artifact.
IMPRESSION: Secondary to significant motion degradation evaluating for new
demyelinating process or evaluating for progression of degenerative
changes is not possible.

Please see above

## 2018-04-15 ENCOUNTER — Ambulatory Visit: Payer: 59 | Attending: Psychiatry | Admitting: Speech Pathology

## 2018-04-20 ENCOUNTER — Ambulatory Visit: Payer: 59 | Admitting: Speech Pathology

## 2018-04-25 IMAGING — CT CT HEAD W/O CM
3 of 4 series · 15 of 47 positions shown, 18 images · non-contrast
Comparison: CT scan of February 16, 2016.

CLINICAL DATA: Headache.

EXAM:
CT HEAD WITHOUT CONTRAST
TECHNIQUE: Contiguous axial images were obtained from the base of the skull
through the vertex without intravenous contrast.

[Series 2: head w/o · axial · non-contrast · 0.45mm/px · z∈[+1671,+1806]mm · 9 of 33 slices shown, 12 images]
[im 3/33  brain]
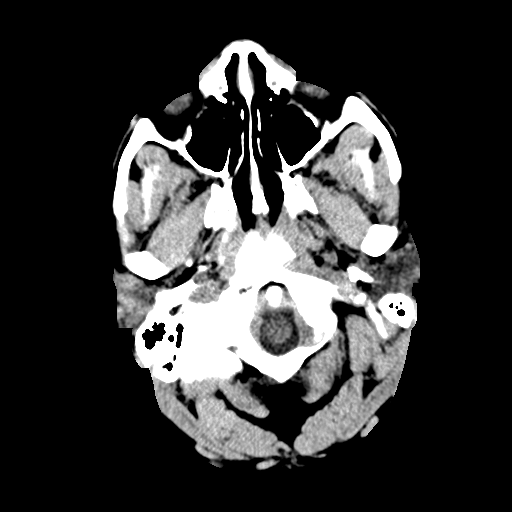
[im 3/33  bone]
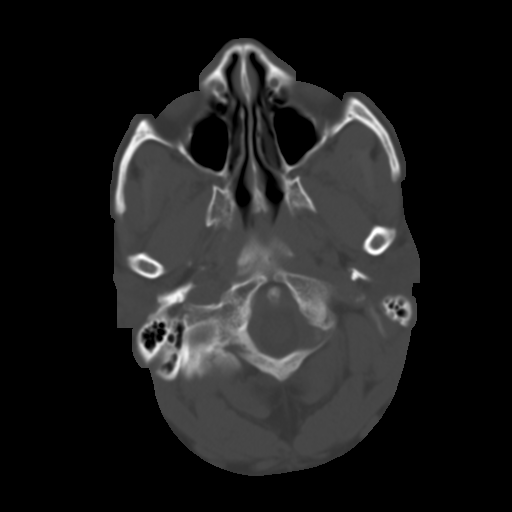
[im 7/33  brain]
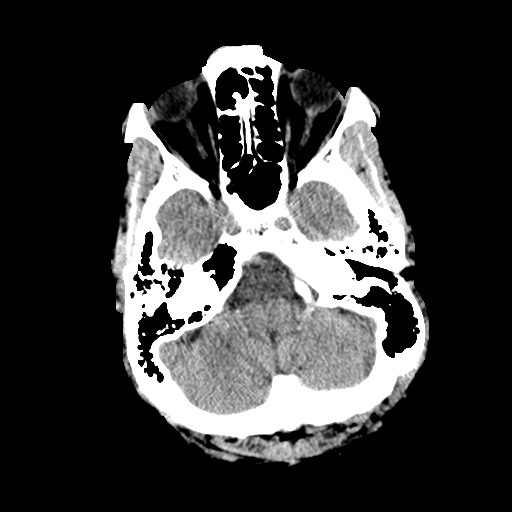
[im 10/33  brain]
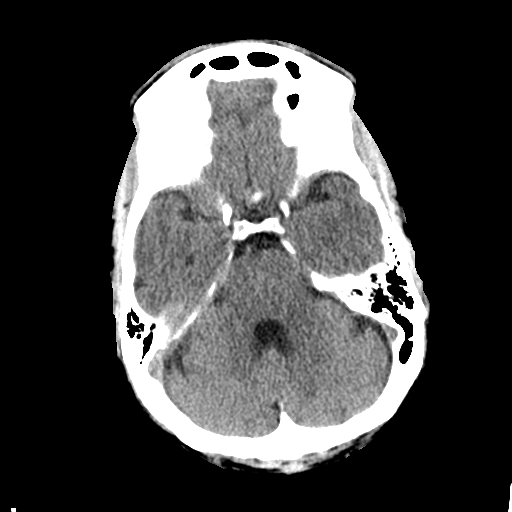
[im 14/33  brain]
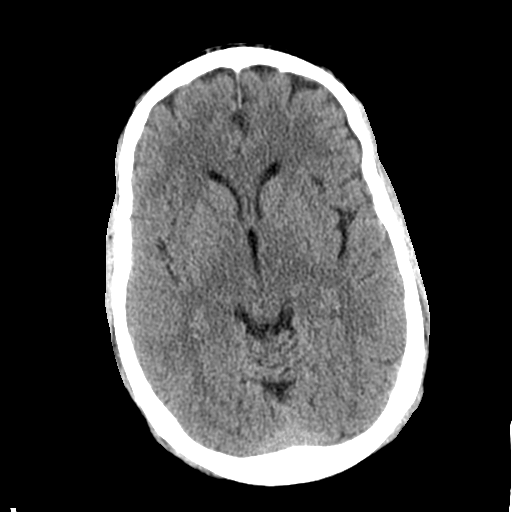
[im 17/33  brain]
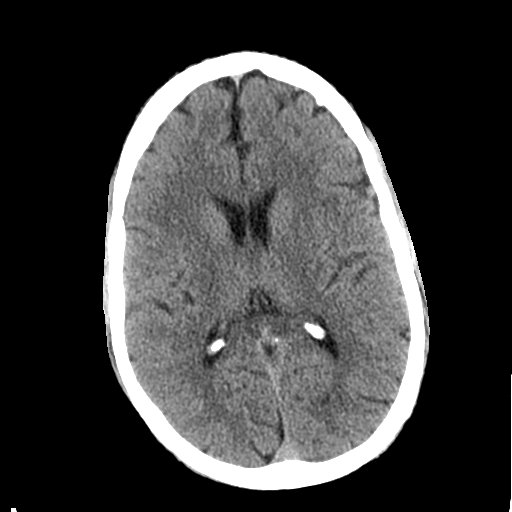
[im 17/33  bone]
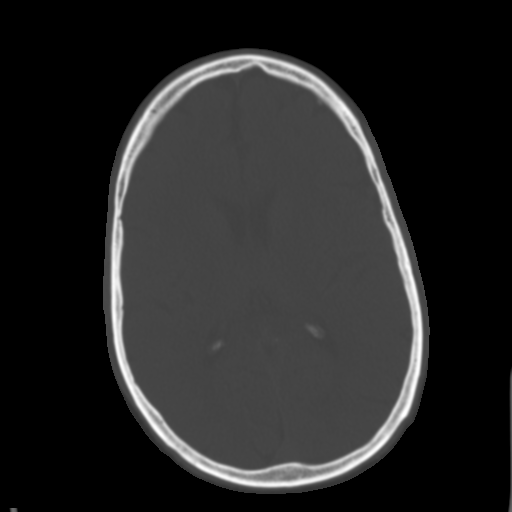
[im 19/33  brain]
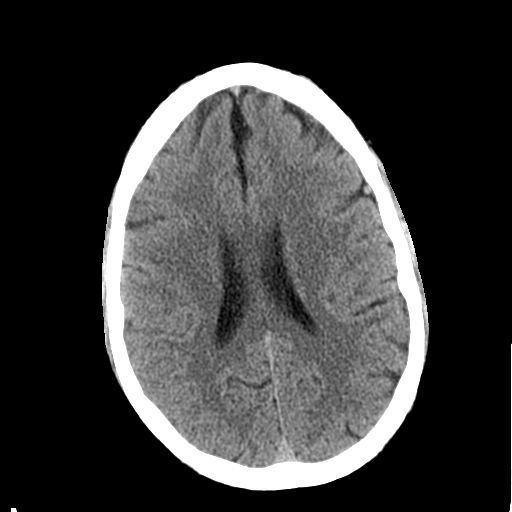
[im 23/33  brain]
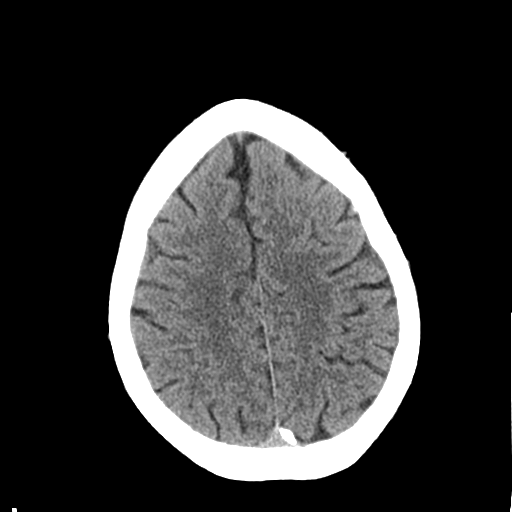
[im 26/33  brain]
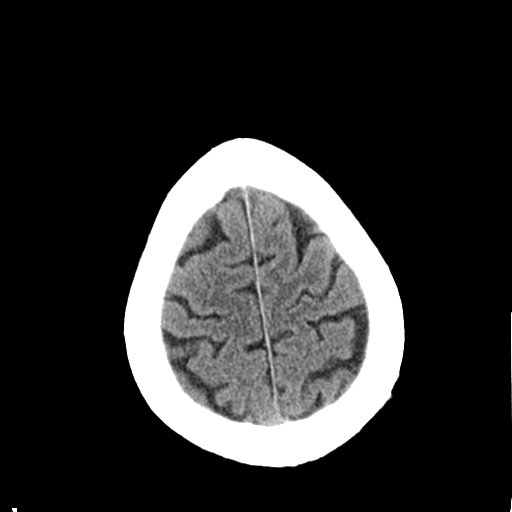
[im 30/33  brain]
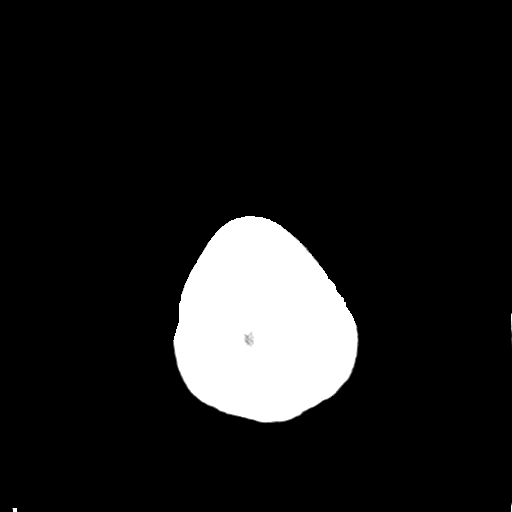
[im 30/33  bone]
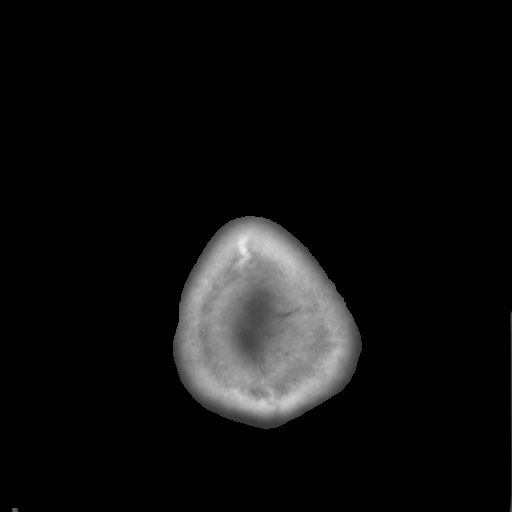

[Series 5: coronal · coronal · 0.32mm/px · 3 of 77 slices shown]
[im 26/77  brain]
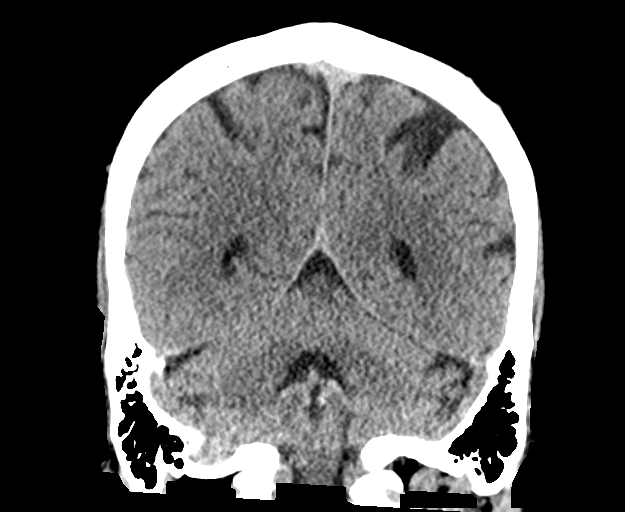
[im 34/77  brain]
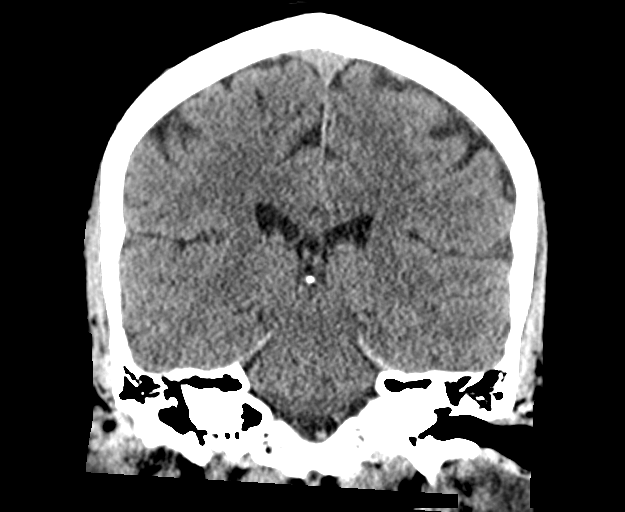
[im 43/77  brain]
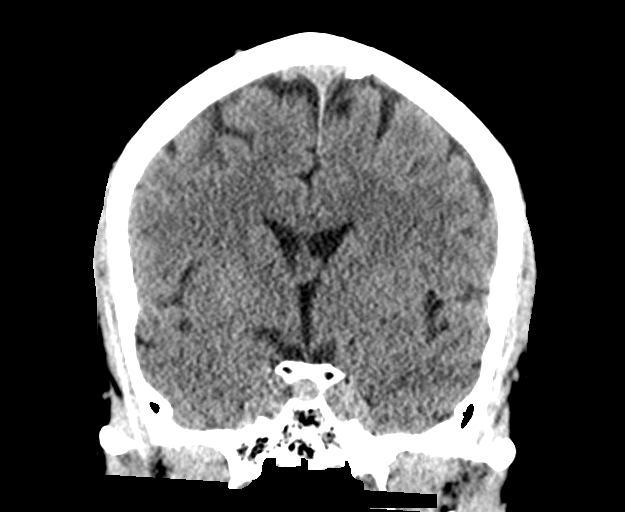

[Series 6: sagittal · sagittal · 0.32mm/px · 3 of 73 slices shown]
[im 25/73  brain]
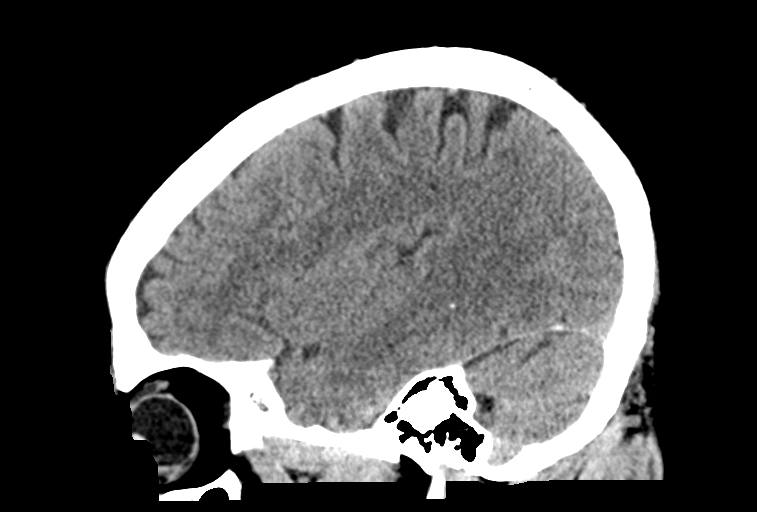
[im 37/73  brain]
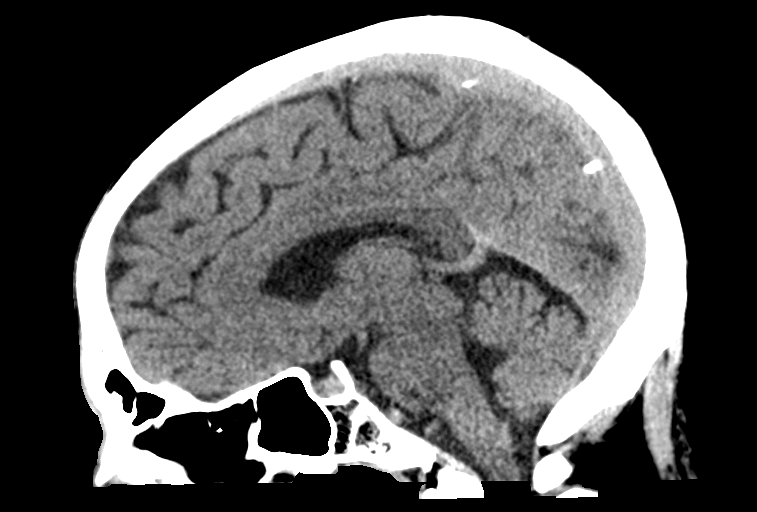
[im 49/73  brain]
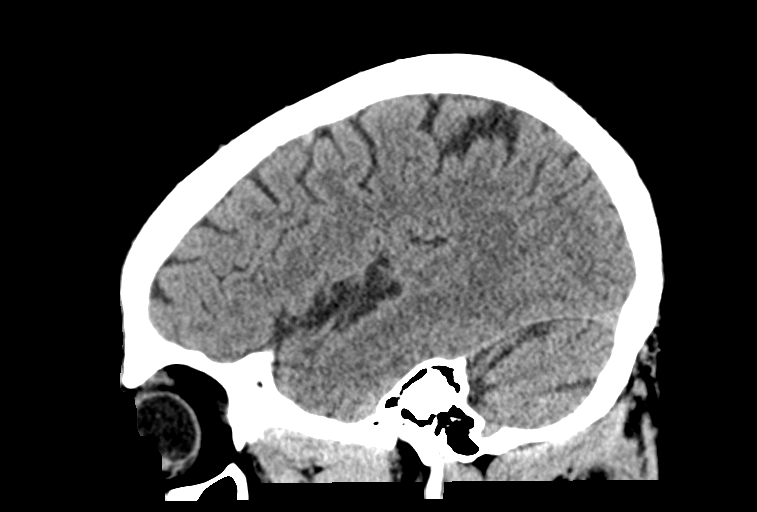

[15 of 47 positions shown; findings below may reference images not displayed]

FINDINGS: Brain: No evidence of acute infarction, hemorrhage, hydrocephalus,
extra-axial collection or mass lesion/mass effect.

Vascular: No hyperdense vessel or unexpected calcification.

Skull: Normal. Negative for fracture or focal lesion.

Sinuses/Orbits: No acute finding.

Other: None.
IMPRESSION: Normal head CT.

## 2018-04-27 ENCOUNTER — Encounter: Payer: Self-pay | Admitting: Speech Pathology

## 2018-04-29 ENCOUNTER — Other Ambulatory Visit: Payer: Self-pay | Admitting: Oncology

## 2018-04-29 DIAGNOSIS — G35 Multiple sclerosis: Secondary | ICD-10-CM

## 2018-05-07 ENCOUNTER — Inpatient Hospital Stay: Payer: 59 | Attending: Oncology | Admitting: Oncology

## 2018-05-07 ENCOUNTER — Inpatient Hospital Stay: Payer: 59

## 2018-05-07 VITALS — BP 135/80 | HR 75 | Temp 98.1°F | Resp 16

## 2018-05-07 DIAGNOSIS — Z79899 Other long term (current) drug therapy: Secondary | ICD-10-CM | POA: Insufficient documentation

## 2018-05-07 DIAGNOSIS — Z87891 Personal history of nicotine dependence: Secondary | ICD-10-CM

## 2018-05-07 DIAGNOSIS — G35 Multiple sclerosis: Secondary | ICD-10-CM

## 2018-05-07 DIAGNOSIS — F329 Major depressive disorder, single episode, unspecified: Secondary | ICD-10-CM

## 2018-05-07 DIAGNOSIS — Z5112 Encounter for antineoplastic immunotherapy: Secondary | ICD-10-CM | POA: Insufficient documentation

## 2018-05-07 DIAGNOSIS — G894 Chronic pain syndrome: Secondary | ICD-10-CM | POA: Insufficient documentation

## 2018-05-07 DIAGNOSIS — Z86718 Personal history of other venous thrombosis and embolism: Secondary | ICD-10-CM

## 2018-05-07 MED ORDER — ACETAMINOPHEN 325 MG PO TABS
ORAL_TABLET | ORAL | Status: AC
  Filled 2018-05-07: qty 2

## 2018-05-07 MED ORDER — DIPHENHYDRAMINE HCL 50 MG/ML IJ SOLN
INTRAMUSCULAR | Status: AC
Start: 2018-05-07 — End: ?
  Filled 2018-05-07: qty 1

## 2018-05-07 MED ORDER — SODIUM CHLORIDE 0.9 % IV SOLN
1000.0000 mg | Freq: Once | INTRAVENOUS | Status: AC
  Administered 2018-05-07: 1000 mg via INTRAVENOUS
  Filled 2018-05-07: qty 100

## 2018-05-07 MED ORDER — HEPARIN SOD (PORK) LOCK FLUSH 100 UNIT/ML IV SOLN
500.0000 [IU] | Freq: Once | INTRAVENOUS | Status: AC | PRN
  Administered 2018-05-07: 500 [IU]
  Filled 2018-05-07: qty 5

## 2018-05-07 MED ORDER — DIPHENHYDRAMINE HCL 50 MG/ML IJ SOLN
50.0000 mg | Freq: Once | INTRAMUSCULAR | Status: AC
  Administered 2018-05-07: 50 mg via INTRAVENOUS

## 2018-05-07 MED ORDER — METHYLPREDNISOLONE SODIUM SUCC 125 MG IJ SOLR
125.0000 mg | Freq: Once | INTRAMUSCULAR | Status: AC
  Administered 2018-05-07: 125 mg via INTRAVENOUS

## 2018-05-07 MED ORDER — ACETAMINOPHEN 325 MG PO TABS
650.0000 mg | ORAL_TABLET | Freq: Once | ORAL | Status: AC
Start: 2018-05-07 — End: 2018-05-07
  Administered 2018-05-07: 650 mg via ORAL

## 2018-05-07 MED ORDER — METHYLPREDNISOLONE SODIUM SUCC 125 MG IJ SOLR
INTRAMUSCULAR | Status: AC
  Filled 2018-05-07: qty 2

## 2018-05-07 MED ORDER — SODIUM CHLORIDE 0.9 % IV SOLN
Freq: Once | INTRAVENOUS | Status: AC
  Administered 2018-05-07: 10:00:00 via INTRAVENOUS
  Filled 2018-05-07: qty 1000

## 2018-05-07 MED ORDER — SODIUM CHLORIDE 0.9% FLUSH
10.0000 mL | INTRAVENOUS | Status: DC | PRN
  Administered 2018-05-07: 10 mL
  Filled 2018-05-07: qty 10

## 2018-05-07 NOTE — Progress Notes (Signed)
Patient switching from Ocrevus to Rituxan today. He states the Ocrevus was not holding, he felt extremely weak and fatigued.

## 2018-05-07 NOTE — Patient Instructions (Signed)

## 2018-05-07 NOTE — Progress Notes (Signed)
[facility]  [phone]) [phone] [phone]) [phone]  ID: Simmie Davies OB: [birth year]  MR#: [id number]  AOZ#:[id number]  Patient Care Team: Fleet Contras, MD as PCP - General (Internal Medicine) Christoper Allegra., MD as Referring Physician (Neurology)  CHIEF COMPLAINT:  Rituxan infusions for MS  INTERVAL HISTORY: Patient returns to clinic today for further evaluation and Rituxan infusion. Recently switched from Ocrevus infusion  d/t increasing weakness by neurologist Dr. Leotis Shames. Currently he feels well and is at his baseline. He admits to continued generalized weakness, worse in bilateral upper extremities and headache.  He denies any recent fevers, shortness of breath, chest pain, nausea, vomiting, constipation or diarrhea.  He maintains a good appetite and denies unintentional weight loss.  Continues to use his motorized wheelchair.   Recently was admitted to the hospital for drug overdose on 03/17/18.  He admitted to taking 15 tablets of Percocet but denied suicidal ideation.  He was evaluated by psy and they recommended inpatient psychiatric facility admission.  He remained stable inpatient for 21 days and was reevaluated and deemed safe to go home with outpatient psychiatric follow-up.  REVIEW OF SYSTEMS:   Review of Systems  Constitutional: Positive for malaise/fatigue. Negative for chills, fever and weight loss.  HENT: Negative for congestion and ear pain.   Eyes: Negative.  Negative for blurred vision and double vision.  Respiratory: Negative.  Negative for cough, sputum production and shortness of breath.   Cardiovascular: Negative.  Negative for chest pain, palpitations and leg swelling.  Gastrointestinal: Negative.  Negative for abdominal pain, constipation, diarrhea, nausea and vomiting.  Genitourinary: Negative for dysuria, frequency and urgency.  Musculoskeletal: Positive for myalgias. Negative for back pain and falls.  Skin: Negative.  Negative for  rash.  Neurological: Positive for weakness. Negative for headaches.  Endo/Heme/Allergies: Negative.  Does not bruise/bleed easily.  Psychiatric/Behavioral: Positive for depression. Negative for hallucinations. The patient is not nervous/anxious and does not have insomnia.     As per HPI. Otherwise, a complete review of systems is negative.  PAST MEDICAL HISTORY: Past Medical History:  Diagnosis Date  . Abdominal pain, unspecified site   . Anxiety   . Arthritis   . Benign paroxysmal positional vertigo   . Cellulitis and abscess of right leg 04/2017  . Chronic back pain   . Chronic pain syndrome 01/25/2008  . Cluster headache   . Depression    takes Zoloft daily  . DVT (deep venous thrombosis) (HCC)    in the left arm '09  . Gait abnormality    "uses mobile wheelchair, but is ambulatory"  . Gallstones 02/17/2009   resolved after gallbladder surgery.  [name] GERD (gastroesophageal reflux disease)    takes Omeprazole as needed  . Headache(784.0)    cluster headaches frequently-takes Topamax daily  . History of colonoscopy   . HTN (hypertension)    takes Lisinopril,Verapamil,and Triamterene HCTZ daily  . Insomnia 11/06/2008  . Joint pain   . Joint swelling    03-07-16 "swelling of right wrist" "after a fall-xray done 03-06-16 "no fractures".  . Memory loss    no an issue at present 03-07-16  . Multiple sclerosis (HCC)    Dx. 2005 - Dr. Tinnie Gens follows LOV 4'17 tx. Tysabri monthly IV-[facility] , [place].  [name] Nonspecific elevation of levels of transaminase or lactic acid dehydrogenase (LDH)   . Other specified visual disturbances   . Other syndromes affecting cervical region   . Pneumonia 2009  . Trigeminal neuralgia  history" Multiple sclerosis"    PAST SURGICAL HISTORY: Past Surgical History:  Procedure Laterality Date  . CHOLECYSTECTOMY  02/20/2009  . COLONOSCOPY WITH PROPOFOL N/A 03/17/2016   Procedure: COLONOSCOPY WITH PROPOFOL;  Surgeon: [name], MD;   Location: [facility];  Service: Endoscopy;  Laterality: N/A;  . PORT A CATH REVISION N/A 07/06/2015   Procedure: Removal and replacement of PORT A CATH;  Surgeon: [name], MD;  Location: [facility] OR;  Service: General;  Laterality: N/A;  . PORTACATH PLACEMENT N/A 03/27/2014   Procedure: INSERTION PORT-A-CATH;  Surgeon: [name], MD;  Location: [facility] OR;  Service: General;  Laterality: N/A;    FAMILY HISTORY Family History  Problem Relation Age of Onset  . Cancer Father   . Diabetes Mother        ADVANCED DIRECTIVES:    HEALTH MAINTENANCE: Social History   Tobacco Use  . Smoking status: Former Smoker    Packs/day: 1.00    Years: 38.00    Pack years: 38.00    Types: Cigarettes  . Smokeless tobacco: Never Used  . Tobacco comment: cutting back  Substance Use Topics  . Alcohol use: Yes    Alcohol/week: 0.0 oz    Comment: occasional  . Drug use: No    Comment: Quit 2011     Colonoscopy:  PAP:  Bone density:  Lipid panel:  Allergies  Allergen Reactions  . Acyclovir And Related     Current Outpatient Medications  Medication Sig Dispense Refill  . baclofen (LIORESAL) 20 MG tablet Take 1 tablet (20 mg total) by mouth 4 (four) times daily. For muscle spasms (Patient not taking: Reported on 03/18/2018) 30 each 0  . gabapentin (NEURONTIN) 600 MG tablet Take 600 mg by mouth 3 (three) times daily.   2  . hydrOXYzine (ATARAX/VISTARIL) 25 MG tablet Take 25 mg by mouth 2 (two) times daily.  2  . levETIRAcetam (KEPPRA) 500 MG tablet Take 1 tablet (500 mg total) by mouth 2 (two) times daily. 30 tablet 0  . Ocrelizumab (OCREVUS IV) Inject into the vein every 6 (six) months.    [name] omeprazole (PRILOSEC) 40 MG capsule Take 1 capsule (40 mg total) by mouth every evening. For acid reflux (Patient taking differently: Take 20 mg by mouth every evening. For acid reflux) 10 capsule 0  . Oxcarbazepine (TRILEPTAL) 300 MG tablet Take 1 tablet (300 mg total) by mouth 3 (three) times daily.  For mood stabilization 90 tablet 0  . oxyCODONE-acetaminophen (PERCOCET) 10-325 MG tablet Take 1 tablet by mouth every 8 (eight) hours as needed for pain.  0  . risperiDONE (RISPERDAL) 0.25 MG tablet Take 1 tablet (0.25 mg total) by mouth at bedtime. 20 tablet 0  . sertraline (ZOLOFT) 50 MG tablet Take 1 tablet (50 mg total) by mouth daily. For depression (Patient not taking: Reported on 03/18/2018) 30 tablet 0  . topiramate (TOPAMAX) 100 MG tablet Take 100 mg by mouth at bedtime.   1  . traZODone (DESYREL) 100 MG tablet Take 100 mg by mouth at bedtime as needed for sleep.     [name] triamterene-hydrochlorothiazide (DYAZIDE) 37.5-25 MG capsule Take 1 each (1 capsule total) by mouth daily. For high blood pressure 10 capsule 0  . XARELTO 10 MG TABS tablet Take 1 tablet (10 mg total) by mouth daily. For blood clot prevention (Patient taking differently: Take 10 mg by mouth every evening. For blood clot prevention) 10 tablet 0   No current facility-administered medications for this visit.  Facility-Administered Medications Ordered in Other Visits  Medication Dose Route Frequency Provider Last Rate Last Dose  . 0.9 %  sodium chloride infusion   Intravenous Once Jeralyn Ruths, MD      . heparin lock flush 100 unit/mL  500 Units Intracatheter Once PRN Jeralyn Ruths, MD      . riTUXimab (RITUXAN) 1,000 mg in sodium chloride 0.9 % 250 mL (2.8571 mg/mL) infusion  1,000 mg Intravenous Once Jeralyn Ruths, MD      . sodium chloride 0.9 % injection 10 mL  10 mL Intracatheter PRN Loann Quill, NP   10 mL at 11/07/15 1012  . sodium chloride flush (NS) 0.9 % injection 10 mL  10 mL Intracatheter PRN Jeralyn Ruths, MD        OBJECTIVE: There were no vitals filed for this visit.   There is no height or weight on file to calculate BMI.    ECOG FS:1 - Symptomatic but completely ambulatory  Physical Exam  Constitutional: He is oriented to person, place, and time. Vital signs are normal. He  appears well-developed and well-nourished.  HENT:  Head: Normocephalic and atraumatic.  Eyes: Pupils are equal, round, and reactive to light.  Neck: Normal range of motion.  Cardiovascular: Normal rate, regular rhythm and normal heart sounds.  No murmur heard. Pulmonary/Chest: Effort normal and breath sounds normal. He has no wheezes.  Abdominal: Soft. Normal appearance and bowel sounds are normal. He exhibits no distension. There is no tenderness.  Musculoskeletal: Normal range of motion. He exhibits no edema.  Generalized weakness  Neurological: He is alert and oriented to person, place, and time.  Skin: Skin is warm and dry. No rash noted.  Psychiatric: Judgment normal.    LAB RESULTS:  Lab Results  Component Value Date   NA 141 03/31/2018   K 3.9 03/31/2018   CL 107 03/31/2018   CO2 28 03/31/2018   GLUCOSE 99 03/31/2018   BUN 11 03/31/2018   CREATININE 0.86 03/31/2018   CALCIUM 9.4 03/31/2018   PROT 6.5 04/01/2018   ALBUMIN 3.1 (L) 04/01/2018   AST 19 04/01/2018   ALT 45 04/01/2018   ALKPHOS 91 04/01/2018   BILITOT 0.1 (L) 04/01/2018   GFRNONAA >60 03/31/2018   GFRAA >60 03/31/2018    Lab Results  Component Value Date   WBC 6.0 04/01/2018   NEUTROABS 13.2 (H) 03/17/2018   HGB 11.8 (L) 04/01/2018   HCT 38.6 (L) 04/01/2018   MCV 88.3 04/01/2018   PLT 548 (H) 04/01/2018     STUDIES: No results found.  ASSESSMENT: Rituxan  infusion for multiple sclerosis.  PLAN:    1.  Rituxan infusion for multiple sclerosis: Recently switched from Ocrevus to Rituxan for worsening weakness in between treatments.  Prior to that he was on Tysabri which was discontinued during hepatitis C treatment.  Patient understands all of his laboratory work including JC virus will be monitored by primary neurologist Dr. Tinnie Gens.  Questions regarding treatment will be directed at neurologist. RTC in 2 weeks for second dose of Rituxan and then again in 4 months with MD assessment and Rituxan  infusion. 2.  Depression/suicidal ideations: Follow-up outpatient with psychiatry.  Currently is not suicidal.  Greater than 50% was spent in counseling and coordination of care with this patient including but not limited to discussion of the relevant topics above (See A&P) including, but not limited to diagnosis and management of acute and chronic medical conditions.   Durenda Hurt, NP  05/07/2018 1:24 PM

## 2018-05-11 IMAGING — MR MR HEAD WO/W CM
10 of 20 series · 25 of 48 positions shown · IV contrast (multihance)
Comparison: none

CLINICAL DATA: Headache beginning at 4 a.m. Weakness. History of
multiple sclerosis. Chronic pain syndrome, however of Roxicodone .

EXAM:
MRI HEAD WITH AND WITHOUT CONTRAST
MRA HEAD WITHOUT CONTRAST
TECHNIQUE: Multiplanar, multiecho pulse sequences of the brain and surrounding
structures were obtained with and without intravenous contrast.
Angiographic images of the head were obtained using MRA technique
without contrast.
CONTRAST:  18 cc MultiHance CT HEAD December 01, 2016 and MRI of the
head November 16, 2016

[Series 5: DWI · axial · 3.0mm · 1.09mm/px · z∈[-88,+59]mm · 4 of 100 slices shown (1 of 4)]
[im 1/100]
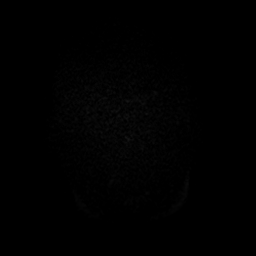
[im 34/100]
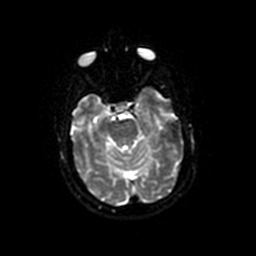
[im 67/100]
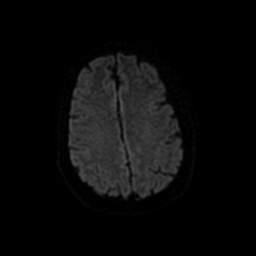
[im 100/100]
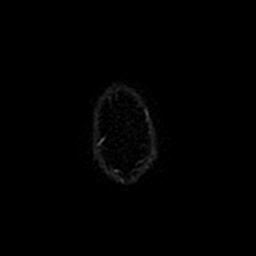

[Series 6: DWI · coronal · 5.0mm · 1.09mm/px · 3 of 83 slices shown (2 of 4)]
[im 1/83]
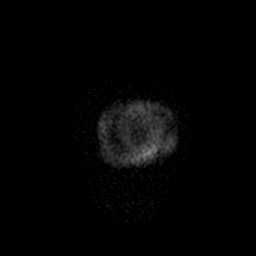
[im 42/83]
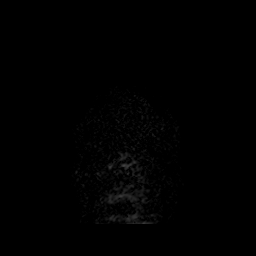
[im 83/83]
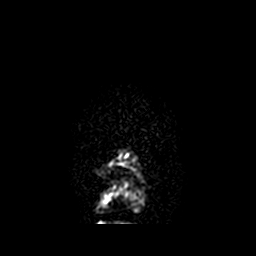

[Series 7: T2 · axial · 5.0mm · 0.43mm/px · 1 of 25 slices shown]
[im 1/25]
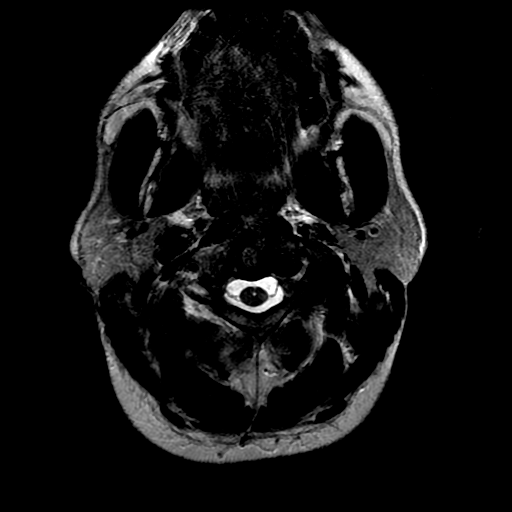

[Series 8: FLAIR · axial · 5.0mm · 0.43mm/px · 1 of 25 slices shown (1 of 2)]
[im 1/25]
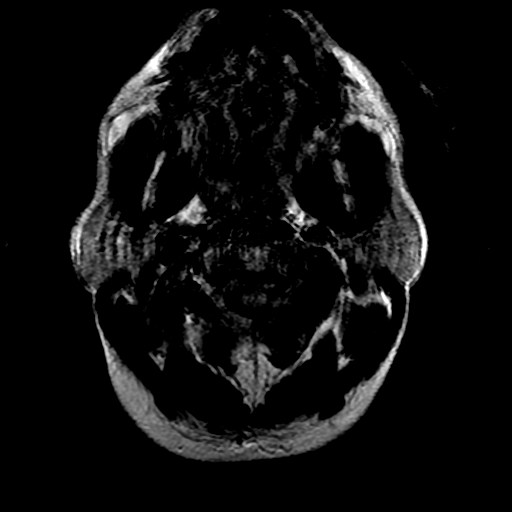

[Series 9: FLAIR · sagittal · 1.6mm · 0.47mm/px · 9 of 224 slices shown (2 of 2)]
[im 1/224]
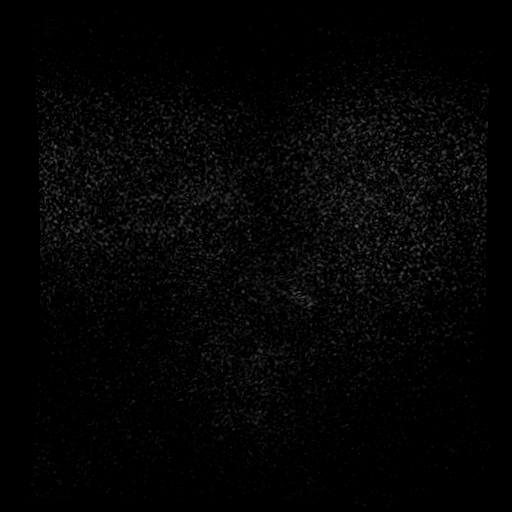
[im 28/224]
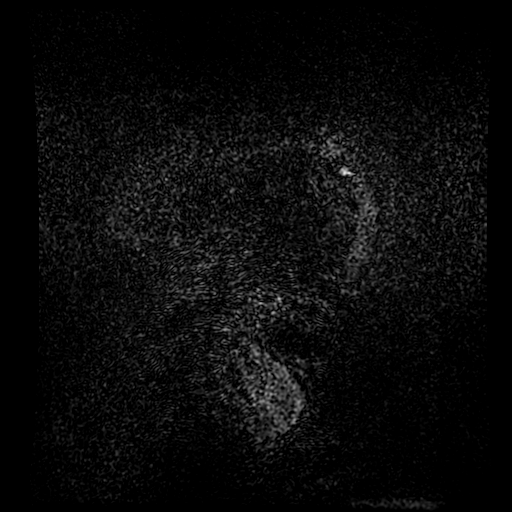
[im 56/224]
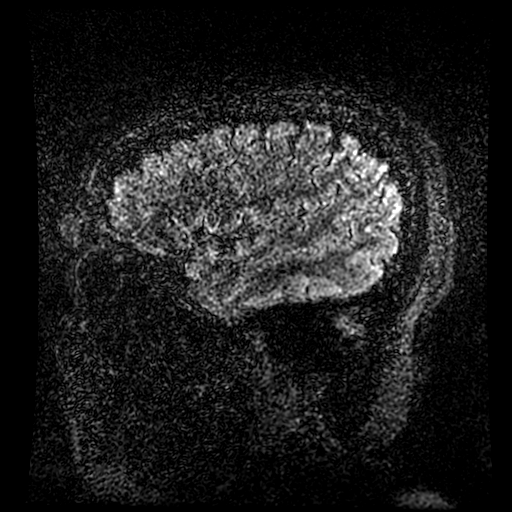
[im 84/224]
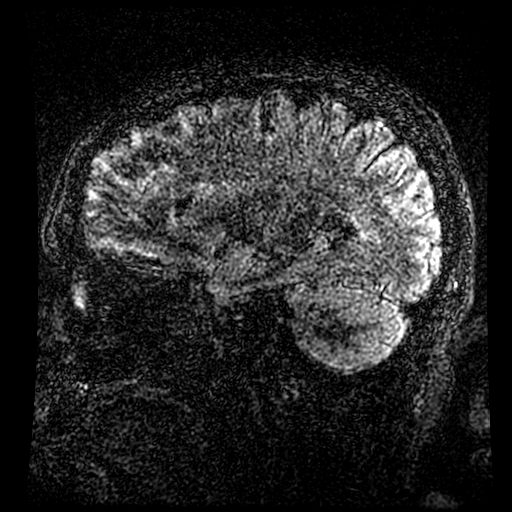
[im 112/224]
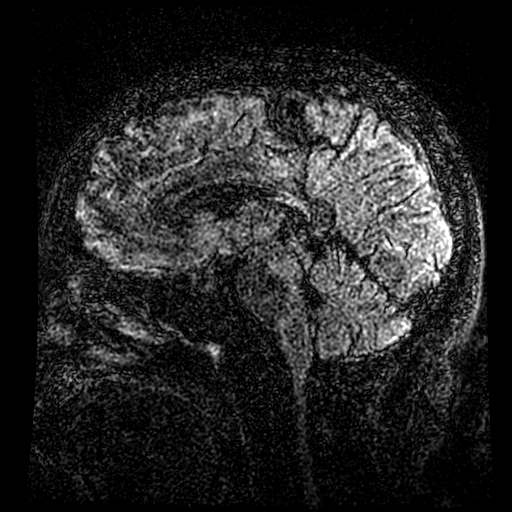
[im 140/224]
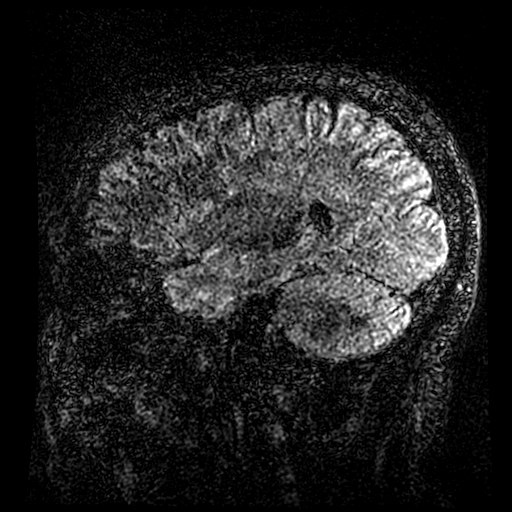
[im 168/224]
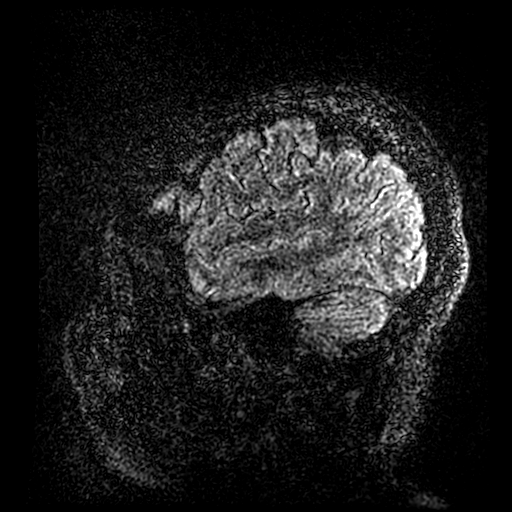
[im 196/224]
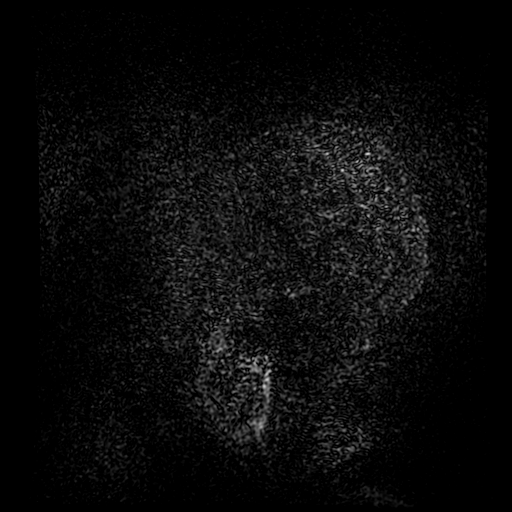
[im 224/224]
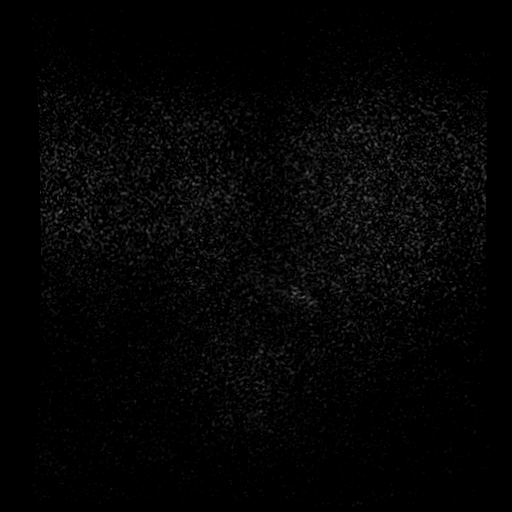

[Series 12: T2 post-contrast · coronal · 5.0mm · 0.45mm/px · 1 of 33 slices shown]
[im 1/33]
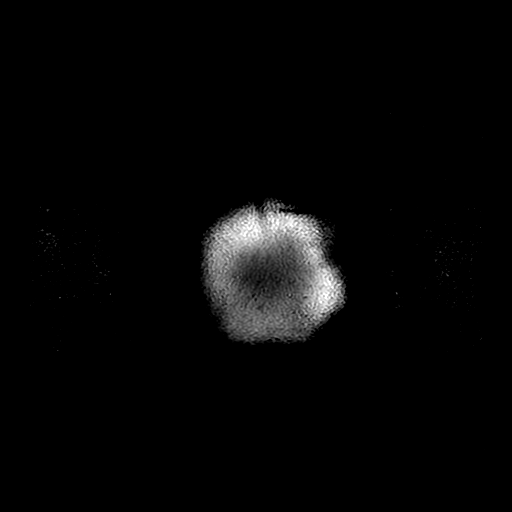

[Series 14: T1 post-contrast · coronal · 5.0mm · 0.45mm/px · 1 of 33 slices shown (1 of 2)]
[im 1/33]
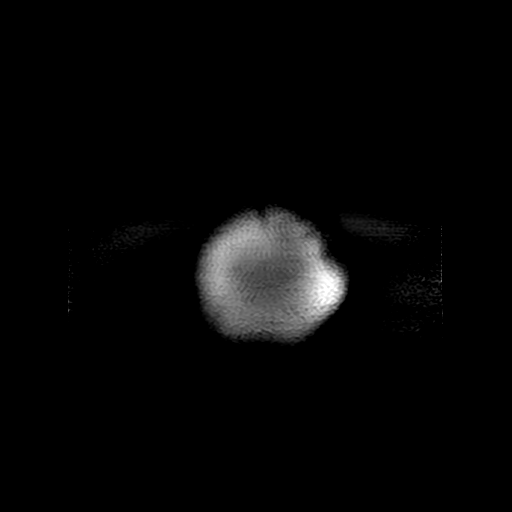

[Series 15: T1 post-contrast · sagittal · 5.0mm · 0.47mm/px · 1 of 27 slices shown (2 of 2)]
[im 1/27]
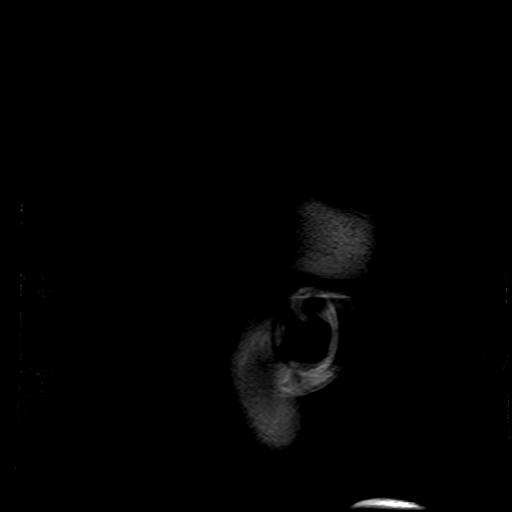

[Series 500: DWI · axial · 3.0mm · 1.09mm/px · z∈[-88,+59]mm · 2 of 50 slices shown (3 of 4)]
[im 1/50]
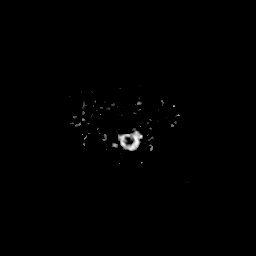
[im 50/50]
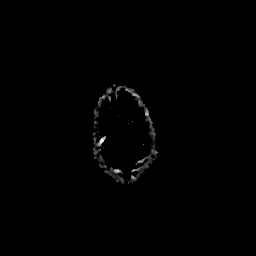

[Series 600: DWI · coronal · 5.0mm · 1.09mm/px · 2 of 42 slices shown (4 of 4)]
[im 1/42]
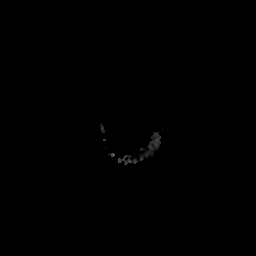
[im 42/42]
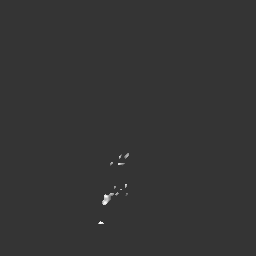

[25 of 48 positions shown; findings below may reference images not displayed]

FINDINGS: MRI HEAD FINDINGS- multiple sequences are moderately motion
degraded.

BRAIN: No reduced diffusion to suggest acute ischemia nor hyperacute
demyelination. No susceptibility artifact to suggest hemorrhage. The
ventricles and sulci are normal for patient's age. Stable appearance
of greater than 10 scattered subcentimeter supratentorial white
matter T2 hyperintensities, at least 1 of which radiate from the
periventricular margin RIGHT occipital lobe, unchanged. No abnormal
extra-axial fluid collections. No abnormal intraparenchymal or
extra-axial enhancement. Ventricles and sulci are normal for
patient's age. No midline shift, mass effect or masses. No abnormal
extra-axial fluid collections, extra-axial enhancement or masses.

VASCULAR: Normal major intracranial vascular flow voids present at
skull base.

SKULL AND UPPER CERVICAL SPINE: No abnormal sellar expansion. No
suspicious calvarial bone marrow signal. Craniocervical junction
maintained.

SINUSES/ORBITS: The mastoid air-cells and included paranasal sinuses
are well-aerated. The included ocular globes and orbital contents
are non-suspicious.

OTHER: None.

MRA HEAD FINDINGS- mild motion degraded examination.

ANTERIOR CIRCULATION: Normal flow related enhancement of the
included cervical, petrous, cavernous and supraclinoid internal
carotid arteries. Patent anterior communicating artery. Normal flow
related enhancement of the anterior and middle cerebral arteries,
including distal segments. On maximum intensity projected
reformations, there is apparent stenosis of RIGHT M2 origin though
this is seen as flow artifact on the raw data.

No large vessel occlusion, high-grade stenosis, abnormal luminal
irregularity, aneurysm.

POSTERIOR CIRCULATION: Codominant vertebral artery's. Artery is
patent, with normal flow related enhancement of the main branch
vessels. Normal flow related enhancement of the posterior cerebral
arteries.

No large vessel occlusion, high-grade stenosis, abnormal luminal
irregularity, aneurysm.

ANATOMIC VARIANTS: None.
IMPRESSION: MRI HEAD: No acute intracranial process on this motion degraded
examination.

Stable examination: Greater than 10 supratentorial white matter
lesions, some of which are typical of chronic demyelination. No new
lesions and no abnormal enhancement. No parenchymal brain volume
loss for age.

MRA HEAD: No emergent large vessel occlusion or severe stenosis on
this motion degraded examination.

## 2018-05-21 ENCOUNTER — Ambulatory Visit: Payer: Self-pay

## 2018-05-24 ENCOUNTER — Inpatient Hospital Stay: Payer: 59 | Attending: Oncology

## 2018-05-24 VITALS — BP 124/77 | HR 73 | Temp 97.7°F | Resp 18

## 2018-05-24 DIAGNOSIS — G35 Multiple sclerosis: Secondary | ICD-10-CM | POA: Diagnosis not present

## 2018-05-24 DIAGNOSIS — Z5112 Encounter for antineoplastic immunotherapy: Secondary | ICD-10-CM | POA: Insufficient documentation

## 2018-05-24 MED ORDER — SODIUM CHLORIDE 0.9 % IV SOLN
Freq: Once | INTRAVENOUS | Status: AC
  Administered 2018-05-24: 10:00:00 via INTRAVENOUS
  Filled 2018-05-24: qty 1000

## 2018-05-24 MED ORDER — HEPARIN SOD (PORK) LOCK FLUSH 100 UNIT/ML IV SOLN
500.0000 [IU] | Freq: Once | INTRAVENOUS | Status: AC | PRN
  Administered 2018-05-24: 500 [IU]
  Filled 2018-05-24: qty 5

## 2018-05-24 MED ORDER — SODIUM CHLORIDE 0.9% FLUSH
10.0000 mL | INTRAVENOUS | Status: DC | PRN
  Administered 2018-05-24: 10 mL
  Filled 2018-05-24: qty 10

## 2018-05-24 MED ORDER — ACETAMINOPHEN 325 MG PO TABS
650.0000 mg | ORAL_TABLET | Freq: Once | ORAL | Status: AC
  Administered 2018-05-24: 650 mg via ORAL
  Filled 2018-05-24: qty 2

## 2018-05-24 MED ORDER — SODIUM CHLORIDE 0.9 % IV SOLN
1000.0000 mg | Freq: Once | INTRAVENOUS | Status: AC
  Administered 2018-05-24: 1000 mg via INTRAVENOUS
  Filled 2018-05-24: qty 100

## 2018-05-24 MED ORDER — METHYLPREDNISOLONE SODIUM SUCC 125 MG IJ SOLR
125.0000 mg | Freq: Once | INTRAMUSCULAR | Status: AC
  Administered 2018-05-24: 125 mg via INTRAVENOUS
  Filled 2018-05-24: qty 2

## 2018-05-24 MED ORDER — DIPHENHYDRAMINE HCL 50 MG/ML IJ SOLN
50.0000 mg | Freq: Once | INTRAMUSCULAR | Status: AC
  Administered 2018-05-24: 50 mg via INTRAVENOUS
  Filled 2018-05-24: qty 1

## 2018-05-24 MED ORDER — SODIUM CHLORIDE 0.9 % IV SOLN: 1000.0000 mg | Freq: Once | INTRAVENOUS | Status: DC

## 2018-07-16 ENCOUNTER — Ambulatory Visit: Payer: Self-pay | Admitting: Oncology

## 2018-07-16 ENCOUNTER — Ambulatory Visit: Payer: Self-pay

## 2018-08-10 IMAGING — MR MR HEAD WO/W CM
11 series · 48 of 48 positions shown · IV contrast (multihance)
Comparison: MRI brain 12/17/2016.

CLINICAL DATA: Multiple sclerosis. Increased spasm and pain.
Increased confusion and memory changes.

EXAM:
MRI HEAD WITHOUT AND WITH CONTRAST
TECHNIQUE: Multiplanar, multiecho pulse sequences of the brain and surrounding
structures were obtained without and with intravenous contrast.
CONTRAST:  17 mL MultiHance

[Series 3: T1 · sagittal · 5.0mm · 0.45mm/px · 2 of 21 slices shown]
[im 1/21]
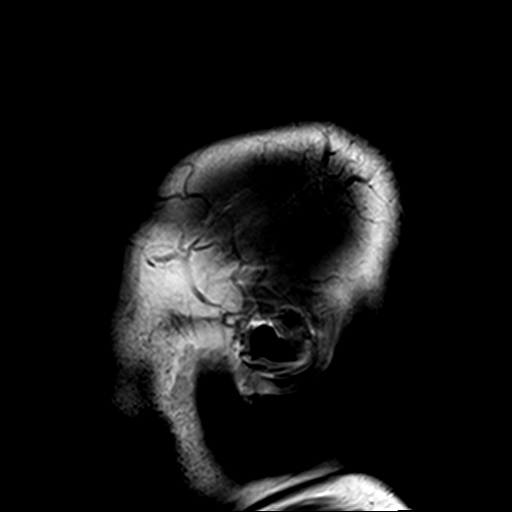
[im 21/21]
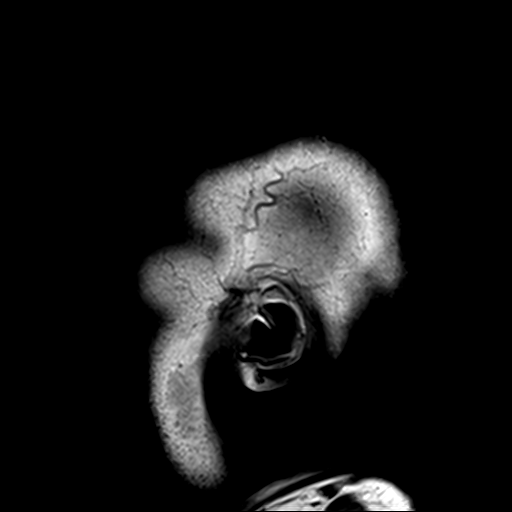

[Series 4: DWI · axial · 3.0mm · 1.80mm/px · z∈[+6,+153]mm · 7 of 100 slices shown (1 of 2)]
[im 1/100]
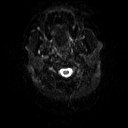
[im 17/100]
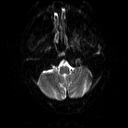
[im 34/100]
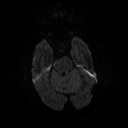
[im 50/100]
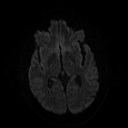
[im 67/100]
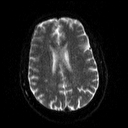
[im 83/100]
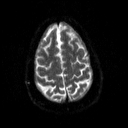
[im 100/100]
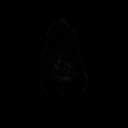

[Series 5: DWI · axial · 3.0mm · 1.80mm/px · z∈[+6,+153]mm · 3 of 47 slices shown (2 of 2)]
[im 1/47]
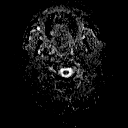
[im 24/47]
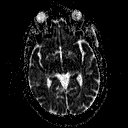
[im 47/47]
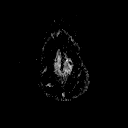

[Series 6: T2 · axial · 5.0mm · 0.60mm/px · z∈[+6,+153]mm · 2 of 22 slices shown (1 of 2)]
[im 1/22]
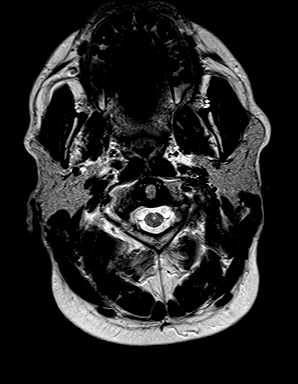
[im 22/22]
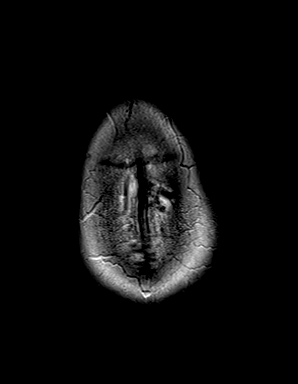

[Series 7: FLAIR · axial · 3.0mm · 0.45mm/px · z∈[+2,+157]mm · 2 of 27 slices shown (1 of 2)]
[im 1/27]
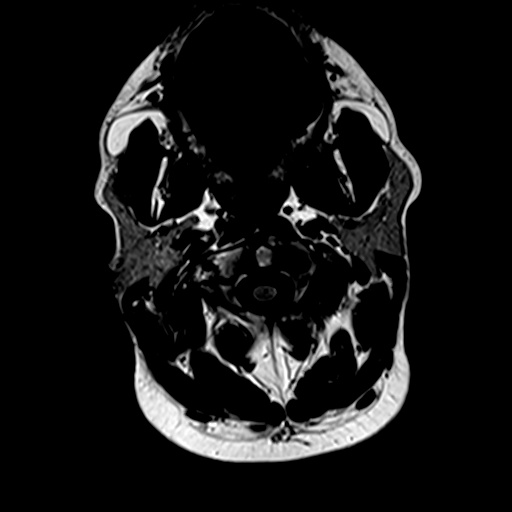
[im 27/27]
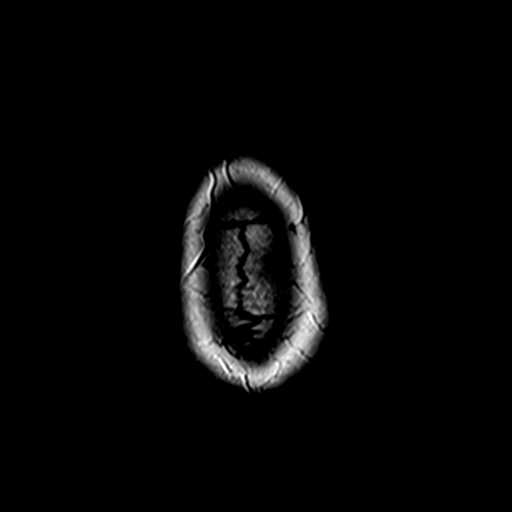

[Series 8: FLAIR · sagittal · 5.0mm · 0.45mm/px · 2 of 25 slices shown (2 of 2)]
[im 1/25]
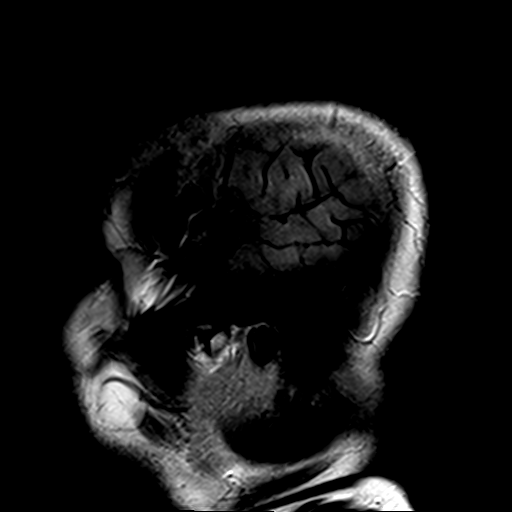
[im 25/25]
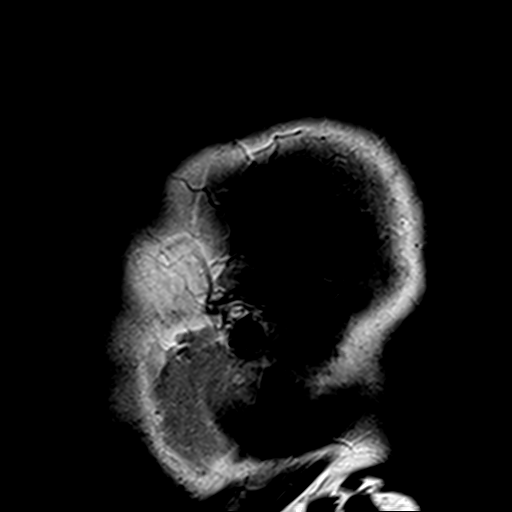

[Series 10: swi_images · axial · 2.0mm · 0.90mm/px · z∈[+1,+158]mm · 6 of 80 slices shown]
[im 1/80]
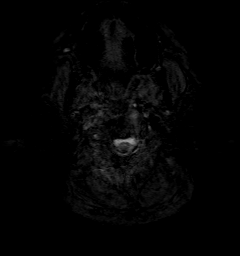
[im 16/80]
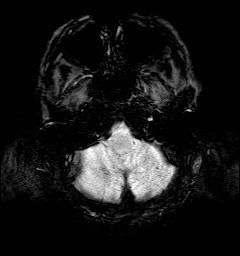
[im 32/80]
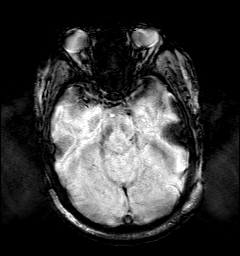
[im 48/80]
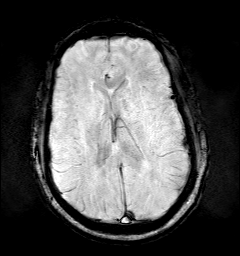
[im 64/80]
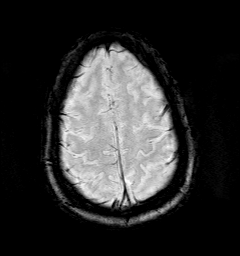
[im 80/80]
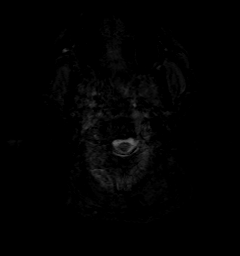

[Series 11: t1_mpr_tra · axial · 1.0mm · 0.72mm/px · z∈[+8,+151]mm · 10 of 144 slices shown (1 of 2)]
[im 1/144]
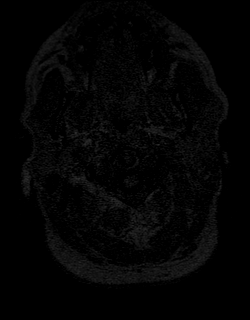
[im 16/144]
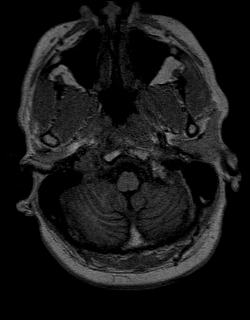
[im 32/144]
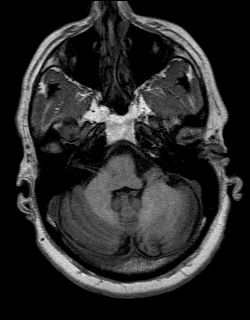
[im 48/144]
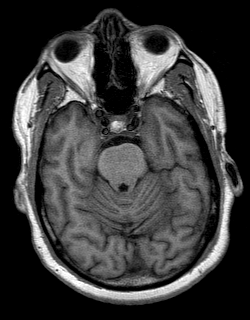
[im 64/144]
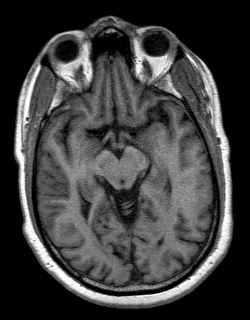
[im 80/144]
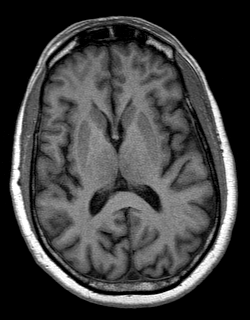
[im 96/144]
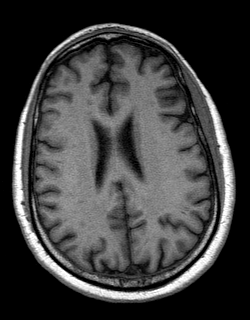
[im 112/144]
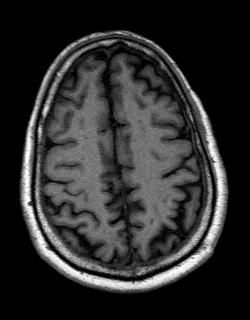
[im 128/144]
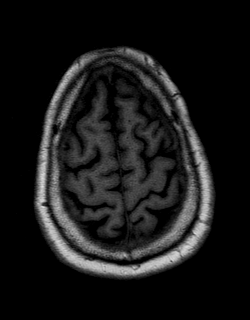
[im 144/144]
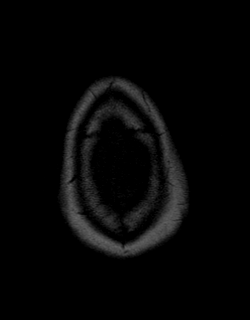

[Series 13: t1_mpr_tra · axial · 1.0mm · 0.72mm/px · z∈[+8,+151]mm · 10 of 144 slices shown (2 of 2)]
[im 1/144]
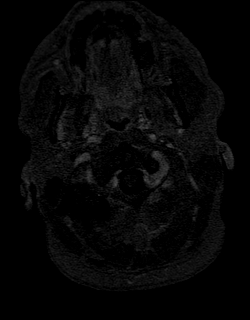
[im 16/144]
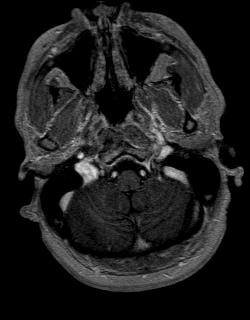
[im 32/144]
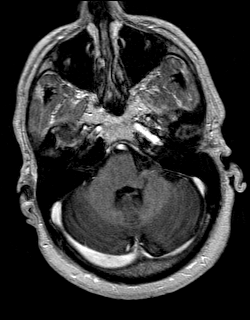
[im 48/144]
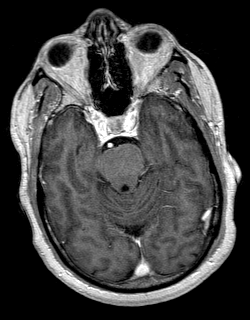
[im 64/144]
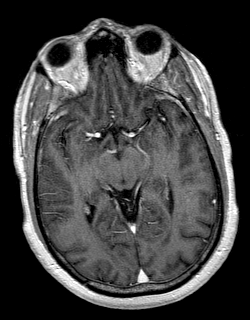
[im 80/144]
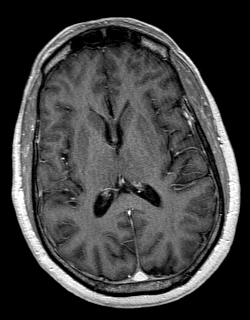
[im 96/144]
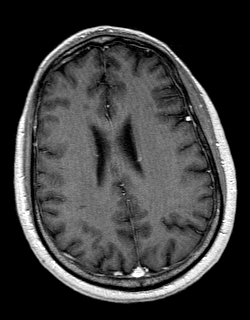
[im 112/144]
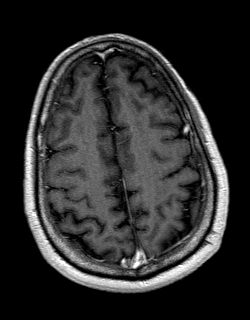
[im 128/144]
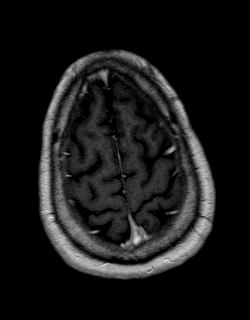
[im 144/144]
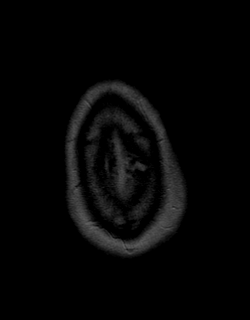

[Series 14: T2 · coronal · 5.0mm · 0.90mm/px · 2 of 25 slices shown (2 of 2)]
[im 1/25]
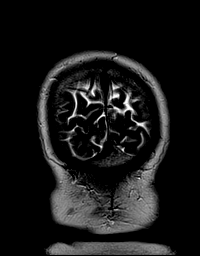
[im 25/25]
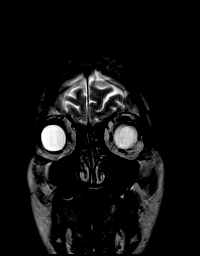

[Series 15: post cor · coronal · 5.0mm · 0.45mm/px · 2 of 25 slices shown]
[im 1/25]
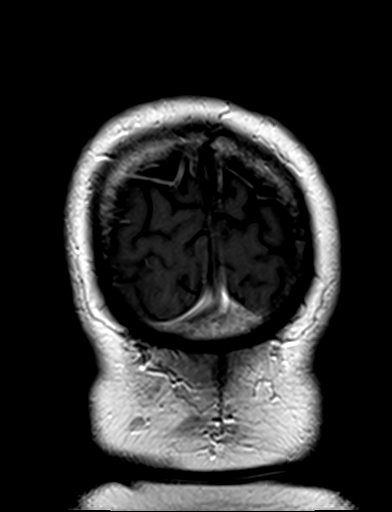
[im 25/25]
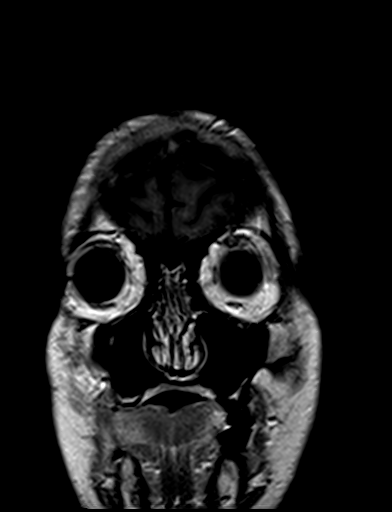

[48 of 48 positions shown; findings below may reference images not displayed]

FINDINGS: Brain: There is significantly less motion degradation on today's
study. Periventricular and subcortical T2 hyperintensities are again
seen without significant interval change. There is no associated
restricted diffusion or enhancement with these lesions. No new
lesions are present. There is minimal involvement with the colossal
septal margin. Several of the lesions are somewhat radial.

Vascular: Flow is present in the major intracranial arteries.

Skull and upper cervical spine: The skullbase is within normal
limits. The craniocervical junction is normal. Midline sagittal
structures are unremarkable. Mild degenerative changes are noted at
the C3-4 level of the cervical spine.

Sinuses/Orbits: The paranasal sinuses and mastoid air cells are
clear. The globes and orbits are within normal limits.
IMPRESSION: 1. No acute intracranial abnormality.
2. Stable periventricular and subcortical T2 changes bilaterally.
While these are nonspecific, they are compatible with the given
diagnosis of multiple sclerosis. There is no MRI evidence for
disease progression.

## 2018-08-29 ENCOUNTER — Other Ambulatory Visit: Payer: Self-pay | Admitting: Oncology

## 2018-08-29 NOTE — Progress Notes (Signed)
[facility]  [phone] [phone]  ID: [name] OB: 18-Mar-1961  MR#: [id number]  QIO#:[id number]  Patient Care Team: Fleet Contras, MD as PCP - General (Internal Medicine) Christoper Allegra., MD as Referring Physician (Neurology)  CHIEF COMPLAINT: Rituxan infusions for MS  INTERVAL HISTORY: Patient returns to clinic today for further evaluation and continuation of Rituxan.  He recently discontinued Ocrevus and started Rituxan with improvement of his symptoms.  Currently he feels well and is at his baseline.  He has no new neurologic complaints.  He denies any recent fevers.  He has a good appetite and denies weight loss.  He has no chest pain or shortness of breath.  He denies any nausea, vomiting, constipation, or diarrhea.  He has no urinary complaints.  Patient offers no specific complaints today.  REVIEW OF SYSTEMS:   Review of Systems  Constitutional: Negative.  Negative for fever, malaise/fatigue and weight loss.  Respiratory: Negative.  Negative for cough and shortness of breath.   Cardiovascular: Negative.  Negative for chest pain and leg swelling.  Gastrointestinal: Negative.  Negative for nausea and vomiting.  Genitourinary: Negative.  Negative for dysuria.  Musculoskeletal: Negative.  Negative for back pain.  Skin: Negative.  Negative for rash.  Neurological: Positive for focal weakness. Negative for sensory change and weakness.  Psychiatric/Behavioral: Negative.  The patient is not nervous/anxious.     As per HPI. Otherwise, a complete review of systems is negative.  PAST MEDICAL HISTORY: Past Medical History:  Diagnosis Date  . Abdominal pain, unspecified site   . Anxiety   . Arthritis   . Benign paroxysmal positional vertigo   . Cellulitis and abscess of right leg 04/2017  . Chronic back pain   . Chronic pain syndrome 01/25/2008  . Cluster headache   . Depression    takes Zoloft daily  . DVT (deep venous  thrombosis) (HCC)    in the left arm '09  . Gait abnormality    "uses mobile wheelchair, but is ambulatory"  . Gallstones 02/17/2009   resolved after gallbladder surgery.  [name] GERD (gastroesophageal reflux disease)    takes Omeprazole as needed  . Headache(784.0)    cluster headaches frequently-takes Topamax daily  . History of colonoscopy   . HTN (hypertension)    takes Lisinopril,Verapamil,and Triamterene HCTZ daily  . Insomnia 11/06/2008  . Joint pain   . Joint swelling    03-07-16 "swelling of right wrist" "after a fall-xray done 03-06-16 "no fractures".  . Memory loss    no an issue at present 03-07-16  . Multiple sclerosis (HCC)    Dx. 2005 - Dr. Tinnie Gens follows LOV 4'17 tx. Tysabri monthly IV-[facility] , [place].  [name] Nonspecific elevation of levels of transaminase or lactic acid dehydrogenase (LDH)   . Other specified visual disturbances   . Other syndromes affecting cervical region   . Pneumonia 2009  . Trigeminal neuralgia     history" Multiple sclerosis"    PAST SURGICAL HISTORY: Past Surgical History:  Procedure Laterality Date  . CHOLECYSTECTOMY  02/20/2009  . COLONOSCOPY WITH PROPOFOL N/A 03/17/2016   Procedure: COLONOSCOPY WITH PROPOFOL;  Surgeon: [name], MD;  Location: [facility] ENDOSCOPY;  Service: Endoscopy;  Laterality: N/A;  . PORT A CATH REVISION N/A 07/06/2015   Procedure: Removal and replacement of PORT A CATH;  Surgeon: [name], MD;  Location: [facility] OR;  Service: General;  Laterality: N/A;  . PORTACATH PLACEMENT N/A 03/27/2014   Procedure: INSERTION PORT-A-CATH;  Surgeon:  [name], MD;  Location: [facility] OR;  Service: General;  Laterality: N/A;    FAMILY HISTORY Family History  Problem Relation Age of Onset  . Cancer Father   . Diabetes Mother        ADVANCED DIRECTIVES:    HEALTH MAINTENANCE: Social History   Tobacco Use  . Smoking status: Former Smoker    Packs/day: 1.00    Years: 38.00    Pack years: 38.00    Types:  Cigarettes  . Smokeless tobacco: Never Used  . Tobacco comment: cutting back  Substance Use Topics  . Alcohol use: Yes    Alcohol/week: 0.0 standard drinks    Comment: occasional  . Drug use: No    Comment: Quit 2011     Colonoscopy:  PAP:  Bone density:  Lipid panel:  Allergies  Allergen Reactions  . Acyclovir And Related     Current Outpatient Medications  Medication Sig Dispense Refill  . gabapentin (NEURONTIN) 600 MG tablet Take 600 mg by mouth 3 (three) times daily.   2  . hydrOXYzine (ATARAX/VISTARIL) 25 MG tablet Take 25 mg by mouth 2 (two) times daily.  2  . levETIRAcetam (KEPPRA) 500 MG tablet Take 1 tablet (500 mg total) by mouth 2 (two) times daily. 30 tablet 0  . Ocrelizumab (OCREVUS IV) Inject into the vein every 6 (six) months.    [name] omeprazole (PRILOSEC) 40 MG capsule Take 1 capsule (40 mg total) by mouth every evening. For acid reflux (Patient taking differently: Take 20 mg by mouth every evening. For acid reflux) 10 capsule 0  . Oxcarbazepine (TRILEPTAL) 300 MG tablet Take 1 tablet (300 mg total) by mouth 3 (three) times daily. For mood stabilization 90 tablet 0  . oxyCODONE-acetaminophen (PERCOCET) 10-325 MG tablet Take 1 tablet by mouth every 8 (eight) hours as needed for pain.  0  . risperiDONE (RISPERDAL) 0.25 MG tablet Take 1 tablet (0.25 mg total) by mouth at bedtime. 20 tablet 0  . topiramate (TOPAMAX) 100 MG tablet Take 100 mg by mouth at bedtime.   1  . XARELTO 10 MG TABS tablet Take 1 tablet (10 mg total) by mouth daily. For blood clot prevention (Patient taking differently: Take 10 mg by mouth every evening. For blood clot prevention) 10 tablet 0  . baclofen (LIORESAL) 20 MG tablet Take 1 tablet (20 mg total) by mouth 4 (four) times daily. For muscle spasms (Patient not taking: Reported on 03/18/2018) 30 each 0  . sertraline (ZOLOFT) 50 MG tablet Take 1 tablet (50 mg total) by mouth daily. For depression (Patient not taking: Reported on 03/18/2018) 30 tablet 0   . traZODone (DESYREL) 100 MG tablet Take 100 mg by mouth at bedtime as needed for sleep.     [name] triamterene-hydrochlorothiazide (DYAZIDE) 37.5-25 MG capsule Take 1 each (1 capsule total) by mouth daily. For high blood pressure (Patient not taking: Reported on 09/03/2018) 10 capsule 0   No current facility-administered medications for this visit.    Facility-Administered Medications Ordered in Other Visits  Medication Dose Route Frequency Provider Last Rate Last Dose  . sodium chloride 0.9 % injection 10 mL  10 mL Intracatheter PRN Loann Quill, NP   10 mL at 11/07/15 1012    OBJECTIVE: There were no vitals filed for this visit.   There is no height or weight on file to calculate BMI.    ECOG FS:1 - Symptomatic but completely ambulatory  General: Well-developed, well-nourished, no acute distress. Eyes: Pink  conjunctiva, anicteric sclera. HEENT: Normocephalic, moist mucous membranes, clear oropharnyx. Lungs: Clear to auscultation bilaterally. Heart: Regular rate and rhythm. No rubs, murmurs, or gallops. Abdomen: Soft, nontender, nondistended. No organomegaly noted, normoactive bowel sounds. Musculoskeletal: No edema, cyanosis, or clubbing. Neuro: Alert, answering all questions appropriately. Cranial nerves grossly intact. Skin: No rashes or petechiae noted. Psych: Normal affect.   LAB RESULTS:  Lab Results  Component Value Date   NA 141 03/31/2018   K 3.9 03/31/2018   CL 107 03/31/2018   CO2 28 03/31/2018   GLUCOSE 99 03/31/2018   BUN 11 03/31/2018   CREATININE 0.86 03/31/2018   CALCIUM 9.4 03/31/2018   PROT 6.5 04/01/2018   ALBUMIN 3.1 (L) 04/01/2018   AST 19 04/01/2018   ALT 45 04/01/2018   ALKPHOS 91 04/01/2018   BILITOT 0.1 (L) 04/01/2018   GFRNONAA >60 03/31/2018   GFRAA >60 03/31/2018    Lab Results  Component Value Date   WBC 6.0 04/01/2018   NEUTROABS 13.2 (H) 03/17/2018   HGB 11.8 (L) 04/01/2018   HCT 38.6 (L) 04/01/2018   MCV 88.3 04/01/2018   PLT  548 (H) 04/01/2018     STUDIES: No results found.  ASSESSMENT: Rituxan infusion for multiple sclerosis.  PLAN:    1. Rituxan infusion for multiple sclerosis: Patient previously was on Tysabri and Ocrevus.  He has now switched to Rituxan with continued control of his symptoms. Patient understands that all of his laboratory work, including JC virus, will be monitored by his primary neurologist. He also understands that if he has any questions regarding Rituxan or his multiple sclerosis but they should be directed towards Dr. Tinnie Gens.  Return to clinic in 4 months for his next infusion and then in 8 months for further evaluation and continuation of Rituxan.  I spent a total of 30 minutes face-to-face with the patient of which greater than 50% of the visit was spent in counseling and coordination of care as detailed above.  Patient expressed understanding and was in agreement with this plan. He also understands that He can call clinic at any time with any questions, concerns, or complaints.    Jeralyn Ruths, MD   09/06/2018 6:50 AM

## 2018-09-03 ENCOUNTER — Inpatient Hospital Stay: Payer: 59 | Attending: Oncology | Admitting: Oncology

## 2018-09-03 ENCOUNTER — Encounter: Payer: Self-pay | Admitting: Oncology

## 2018-09-03 ENCOUNTER — Inpatient Hospital Stay: Payer: 59

## 2018-09-03 VITALS — BP 117/78 | HR 61 | Temp 96.7°F | Resp 18

## 2018-09-03 DIAGNOSIS — Z86718 Personal history of other venous thrombosis and embolism: Secondary | ICD-10-CM | POA: Insufficient documentation

## 2018-09-03 DIAGNOSIS — Z5112 Encounter for antineoplastic immunotherapy: Secondary | ICD-10-CM | POA: Diagnosis present

## 2018-09-03 DIAGNOSIS — I10 Essential (primary) hypertension: Secondary | ICD-10-CM | POA: Insufficient documentation

## 2018-09-03 DIAGNOSIS — F419 Anxiety disorder, unspecified: Secondary | ICD-10-CM | POA: Diagnosis not present

## 2018-09-03 DIAGNOSIS — G35 Multiple sclerosis: Secondary | ICD-10-CM

## 2018-09-03 DIAGNOSIS — F329 Major depressive disorder, single episode, unspecified: Secondary | ICD-10-CM | POA: Insufficient documentation

## 2018-09-03 DIAGNOSIS — Z87891 Personal history of nicotine dependence: Secondary | ICD-10-CM | POA: Insufficient documentation

## 2018-09-03 DIAGNOSIS — Z79899 Other long term (current) drug therapy: Secondary | ICD-10-CM | POA: Diagnosis not present

## 2018-09-03 MED ORDER — SODIUM CHLORIDE 0.9 % IV SOLN
Freq: Once | INTRAVENOUS | Status: AC
  Administered 2018-09-03: 09:00:00 via INTRAVENOUS
  Filled 2018-09-03: qty 250

## 2018-09-03 MED ORDER — SODIUM CHLORIDE 0.9 % IV SOLN: 1000.0000 mg | Freq: Once | INTRAVENOUS | Status: DC

## 2018-09-03 MED ORDER — SODIUM CHLORIDE 0.9% FLUSH
10.0000 mL | INTRAVENOUS | Status: DC | PRN
  Administered 2018-09-03: 10 mL
  Filled 2018-09-03: qty 10

## 2018-09-03 MED ORDER — SODIUM CHLORIDE 0.9 % IV SOLN
1000.0000 mg | Freq: Once | INTRAVENOUS | Status: AC
  Administered 2018-09-03: 1000 mg via INTRAVENOUS
  Filled 2018-09-03: qty 100

## 2018-09-03 MED ORDER — ACETAMINOPHEN 325 MG PO TABS
650.0000 mg | ORAL_TABLET | Freq: Once | ORAL | Status: AC
  Administered 2018-09-03: 650 mg via ORAL
  Filled 2018-09-03: qty 2

## 2018-09-03 MED ORDER — METHYLPREDNISOLONE SODIUM SUCC 125 MG IJ SOLR
125.0000 mg | Freq: Once | INTRAMUSCULAR | Status: AC
  Administered 2018-09-03: 125 mg via INTRAVENOUS
  Filled 2018-09-03: qty 2

## 2018-09-03 MED ORDER — HEPARIN SOD (PORK) LOCK FLUSH 100 UNIT/ML IV SOLN
500.0000 [IU] | Freq: Once | INTRAVENOUS | Status: AC | PRN
  Administered 2018-09-03: 500 [IU]
  Filled 2018-09-03: qty 5

## 2018-09-03 MED ORDER — DIPHENHYDRAMINE HCL 50 MG/ML IJ SOLN
50.0000 mg | Freq: Once | INTRAMUSCULAR | Status: AC
  Administered 2018-09-03: 50 mg via INTRAVENOUS
  Filled 2018-09-03: qty 1

## 2018-09-03 NOTE — Patient Instructions (Signed)

## 2018-09-03 NOTE — Progress Notes (Signed)
Pt in for follow up.

## 2018-09-21 ENCOUNTER — Ambulatory Visit (INDEPENDENT_AMBULATORY_CARE_PROVIDER_SITE_OTHER): Payer: 59 | Admitting: Orthopaedic Surgery

## 2018-09-21 ENCOUNTER — Encounter (INDEPENDENT_AMBULATORY_CARE_PROVIDER_SITE_OTHER): Payer: Self-pay | Admitting: Orthopaedic Surgery

## 2018-09-21 DIAGNOSIS — G8929 Other chronic pain: Secondary | ICD-10-CM

## 2018-09-21 DIAGNOSIS — M25511 Pain in right shoulder: Secondary | ICD-10-CM

## 2018-09-21 DIAGNOSIS — M170 Bilateral primary osteoarthritis of knee: Secondary | ICD-10-CM

## 2018-09-21 MED ORDER — LIDOCAINE HCL 1 % IJ SOLN
3.0000 mL | INTRAMUSCULAR | Status: AC | PRN
  Administered 2018-09-21: 3 mL

## 2018-09-21 MED ORDER — BUPIVACAINE HCL 0.25 % IJ SOLN
0.6600 mL | INTRAMUSCULAR | Status: AC | PRN
  Administered 2018-09-21: .66 mL via INTRA_ARTICULAR

## 2018-09-21 MED ORDER — METHYLPREDNISOLONE ACETATE 40 MG/ML IJ SUSP
13.3300 mg | INTRAMUSCULAR | Status: AC | PRN
  Administered 2018-09-21: 13.33 mg via INTRA_ARTICULAR

## 2018-09-21 MED ORDER — METHYLPREDNISOLONE ACETATE 40 MG/ML IJ SUSP
40.0000 mg | INTRAMUSCULAR | Status: AC | PRN
  Administered 2018-09-21: 40 mg via INTRA_ARTICULAR

## 2018-09-21 MED ORDER — BUPIVACAINE HCL 0.25 % IJ SOLN
2.0000 mL | INTRAMUSCULAR | Status: AC | PRN
  Administered 2018-09-21: 2 mL via INTRA_ARTICULAR

## 2018-09-21 MED ORDER — LIDOCAINE HCL 2 % IJ SOLN
2.0000 mL | INTRAMUSCULAR | Status: AC | PRN
  Administered 2018-09-21: 2 mL

## 2018-09-21 NOTE — Progress Notes (Signed)
Office Visit Note   Patient: [name]           Date of Birth: 11/22/1960           MRN: [id number] Visit Date: 09/21/2018              Requested by: Fleet Contras, MD [address] [place], [place] [address] PCP: Fleet Contras, MD   Assessment & Plan: Visit Diagnoses:  1. Chronic right shoulder pain   2. Bilateral primary osteoarthritis of knee     Plan: Impression is right shoulder subacromial bursitis.  #2 primary localized osteoarthritis both knees.  In regards to the right shoulder and both knees, we will proceed with cortisone injections.  He will follow-up with [place] as needed.  Follow-Up Instructions: Return if symptoms worsen or fail to improve.   Orders:  Orders Placed This Encounter  Procedures  . Large Joint Inj: R subacromial bursa  . Large Joint Inj: bilateral knee   No orders of the defined types were placed in this encounter.     Procedures: Large Joint Inj: R subacromial bursa on 09/21/2018 3:00 PM Indications: pain Details: 22 G needle Medications: 2 mL lidocaine 2 %; 2 mL bupivacaine 0.25 %; 40 mg methylPREDNISolone acetate 40 MG/ML Outcome: tolerated well, no immediate complications Patient was prepped and draped in the usual sterile fashion.   Large Joint Inj: bilateral knee on 09/21/2018 3:00 PM Indications: pain Details: 22 G needle, anterolateral approach Medications (Right): 0.66 mL bupivacaine 0.25 %; 3 mL lidocaine 1 %; 13.33 mg methylPREDNISolone acetate 40 MG/ML Medications (Left): 0.66 mL bupivacaine 0.25 %; 3 mL lidocaine 1 %; 13.33 mg methylPREDNISolone acetate 40 MG/ML      Clinical Data: No additional findings.   Subjective: Chief Complaint  Patient presents with  . Right Shoulder - Follow-up  . Left Shoulder - Follow-up  . Right Knee - Follow-up  . Left Knee - Follow-up    HPI patient is a pleasant 57 year old wheelchair-bound gentleman with multiple sclerosis who presents to our clinic today with right shoulder  and bilateral knee pain.  In regards to the right shoulder, this is been bothering him for the past 3 months and has progressively worsened.  No known injury or change in activity.  The pain he has is to the entire shoulder.  Worse with forward flexion, internal rotation and sleeping on the right side.  He is tried over-the-counter medication without relief of symptoms.  No previous injection to the right shoulder.  In regards to both knees, history of osteoarthritis.  He has had a series of cortisone injections in the past with the last ones being in March of this past year.  This provided 7 months of significant relief of pain.  He would like repeat injections today.  The pain he has is to the entire aspect of both knees.  Pain appears to be worse with cold weather and rain.  Nothing seems to make this better other than the cortisone injections.  Review of Systems as detailed in HPI.  All others are negative   Objective: Vital Signs: There were no vitals taken for this visit.  Physical Exam well-developed well-nourished gentleman in no acute distress.  Alert and oriented x3.  Ortho Exam examination of his right shoulder reveals full active range of motion.  He does have pain with internal rotation as well as the extremes of forward flexion.  Minimally positive empty can.  His neurovascular intact distally.  Examination of both knees reveals  5 to 60 degrees of active range of motion.  I can passively get him from 0 to 90 degrees.  This is due to weakness from his multiple sclerosis.  No joint line tenderness.  Specialty Comments:  No specialty comments available.  Imaging: No new imaging   PMFS History: Patient Active Problem List   Diagnosis Date Noted  . Chronic right shoulder pain 09/21/2018  . Bilateral primary osteoarthritis of knee 09/21/2018  . Drug overdose 04/08/2018  . Suicide attempt (HCC)   . Overdose by acetaminophen, accidental or unintentional, initial encounter 03/18/2018    . Intentional acetaminophen overdose (HCC)   . Severe major depression, single episode, with psychotic features (HCC) 10/04/2017  . Weakness   . Cellulitis of right leg 05/07/2017  . Right carpal tunnel syndrome 02/16/2017  . Gait disturbance 01/22/2017  . Insomnia 01/22/2017  . Depression with anxiety 01/22/2017  . High risk medication use 01/22/2017  . Major depressive disorder, recurrent episode (HCC) 12/19/2016  . Suicidal ideation   . AKI (acute kidney injury) (HCC) 11/15/2016  . Chronic hepatitis C virus infection (HCC) 11/15/2016  . Chronic abdominal pain 11/15/2016  . Chronic pain syndrome 11/15/2016  . Acute retention of urine 11/15/2016  . Generalized abdominal pain   . Chronic cluster headache, not intractable   . Community acquired pneumonia   . TB lung, latent   . HCAP (healthcare-associated pneumonia) 02/16/2016  . Hypokalemia 02/16/2016  . Intractable cluster headache syndrome   . DVT (deep venous thrombosis) (HCC)   . Depression   . GERD (gastroesophageal reflux disease)   . HTN (hypertension)   . Essential hypertension   . Gastroesophageal reflux disease without esophagitis   . CAP (community acquired pneumonia)   . Multiple sclerosis (HCC) 08/02/2013  . ABDOMINAL BLOATING 10/14/2010  . LOOSE STOOLS 10/14/2010  . PULMONARY EMBOLISM, HX OF 10/14/2010   Past Medical History:  Diagnosis Date  . Abdominal pain, unspecified site   . Anxiety   . Arthritis   . Benign paroxysmal positional vertigo   . Cellulitis and abscess of right leg 04/2017  . Chronic back pain   . Chronic pain syndrome 01/25/2008  . Cluster headache   . Depression    takes Zoloft daily  . DVT (deep venous thrombosis) (HCC)    in the left arm '09  . Gait abnormality    "uses mobile wheelchair, but is ambulatory"  . Gallstones 02/17/2009   resolved after gallbladder surgery.  [name] GERD (gastroesophageal reflux disease)    takes Omeprazole as needed  . Headache(784.0)    cluster  headaches frequently-takes Topamax daily  . History of colonoscopy   . HTN (hypertension)    takes Lisinopril,Verapamil,and Triamterene HCTZ daily  . Insomnia 11/06/2008  . Joint pain   . Joint swelling    03-07-16 "swelling of right wrist" "after a fall-xray done 03-06-16 "no fractures".  . Memory loss    no an issue at present 03-07-16  . Multiple sclerosis (HCC)    Dx. 2005 - Dr. Tinnie Gens follows LOV 4'17 tx. Tysabri monthly IV-[facility] , [place].  [name] Nonspecific elevation of levels of transaminase or lactic acid dehydrogenase (LDH)   . Other specified visual disturbances   . Other syndromes affecting cervical region   . Pneumonia 2009  . Trigeminal neuralgia     history" Multiple sclerosis"    Family History  Problem Relation Age of Onset  . Cancer Father   . Diabetes Mother     Past Surgical  History:  Procedure Laterality Date  . CHOLECYSTECTOMY  02/20/2009  . COLONOSCOPY WITH PROPOFOL N/A 03/17/2016   Procedure: COLONOSCOPY WITH PROPOFOL;  Surgeon: [name], MD;  Location: [facility]COPY;  Service: Endoscopy;  Laterality: N/A;  . PORT A CATH REVISION N/A 07/06/2015   Procedure: Removal and replacement of PORT A CATH;  Surgeon: [name], MD;  Location: [facility] OR;  Service: General;  Laterality: N/A;  . PORTACATH PLACEMENT N/A 03/27/2014   Procedure: INSERTION PORT-A-CATH;  Surgeon: [name], MD;  Location: [facility] OR;  Service: General;  Laterality: N/A;   Social History   Occupational History    Comment: Disabled  Tobacco Use  . Smoking status: Former Smoker    Packs/day: 1.00    Years: 38.00    Pack years: 38.00    Types: Cigarettes  . Smokeless tobacco: Never Used  . Tobacco comment: cutting back  Substance and Sexual Activity  . Alcohol use: Yes    Alcohol/week: 0.0 standard drinks    Comment: occasional  . Drug use: No    Comment: Quit 2011  . Sexual activity: Not on file

## 2018-09-28 IMAGING — CR DG FOOT 2V*R*
2 series · 2 of 2 positions shown · non-contrast
Comparison: None.

CLINICAL DATA: Edema and erythema

EXAM:
RIGHT FOOT - 2 VIEW

[foot ap]
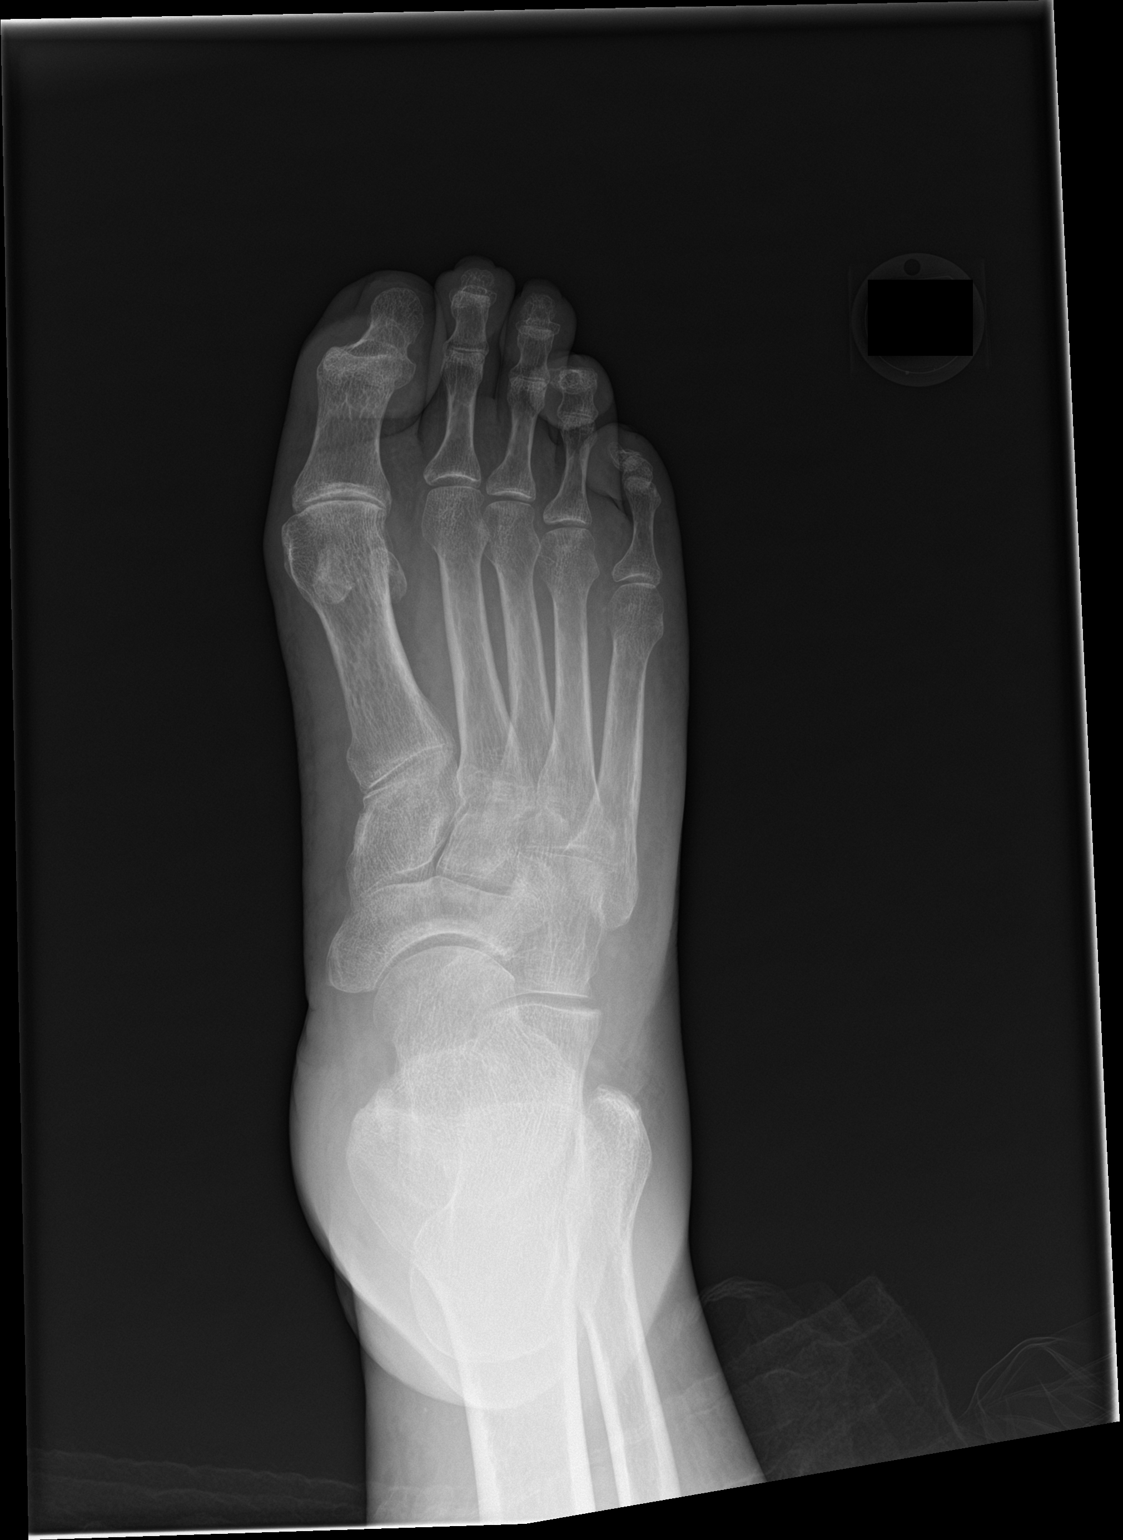

[foot lat]
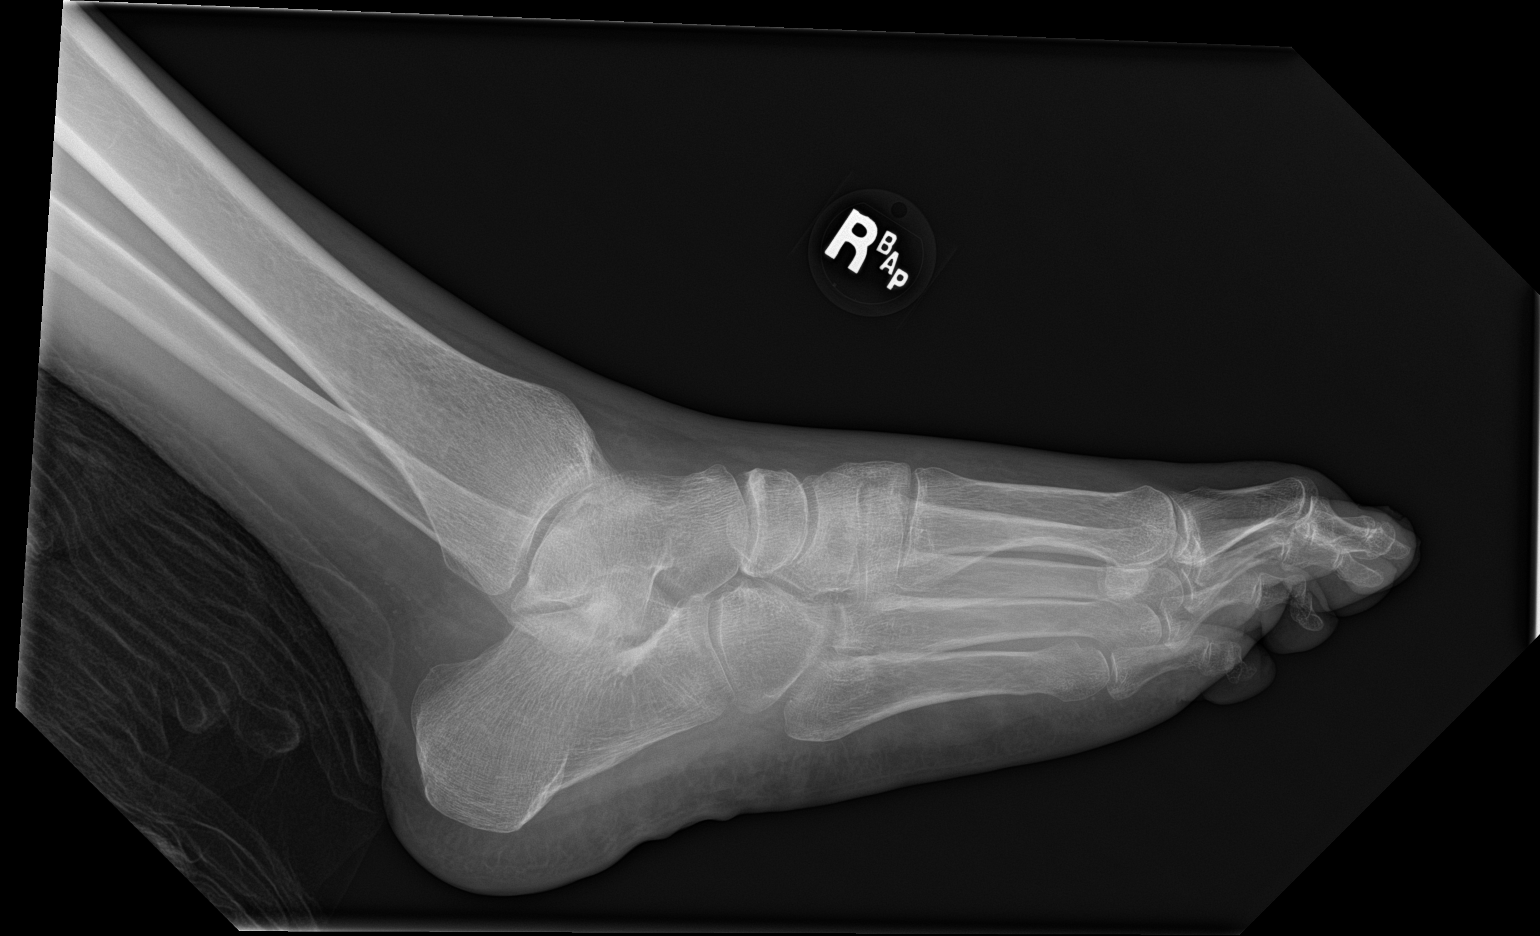

[2 of 2 positions shown; findings below may reference images not displayed]

FINDINGS: The bones are osteopenic. There is soft tissue swelling without soft
tissue emphysema. No osteolysis. No ankle effusion.
IMPRESSION: No acute osseous abnormality or soft tissue emphysema.

## 2018-09-28 IMAGING — CR DG TIBIA/FIBULA 2V*R*
4 series · 4 of 4 positions shown · non-contrast
Comparison: None.

CLINICAL DATA: Swelling and erythema

EXAM:
RIGHT TIBIA AND FIBULA - 2 VIEW

[tibia ap (1 of 2)]
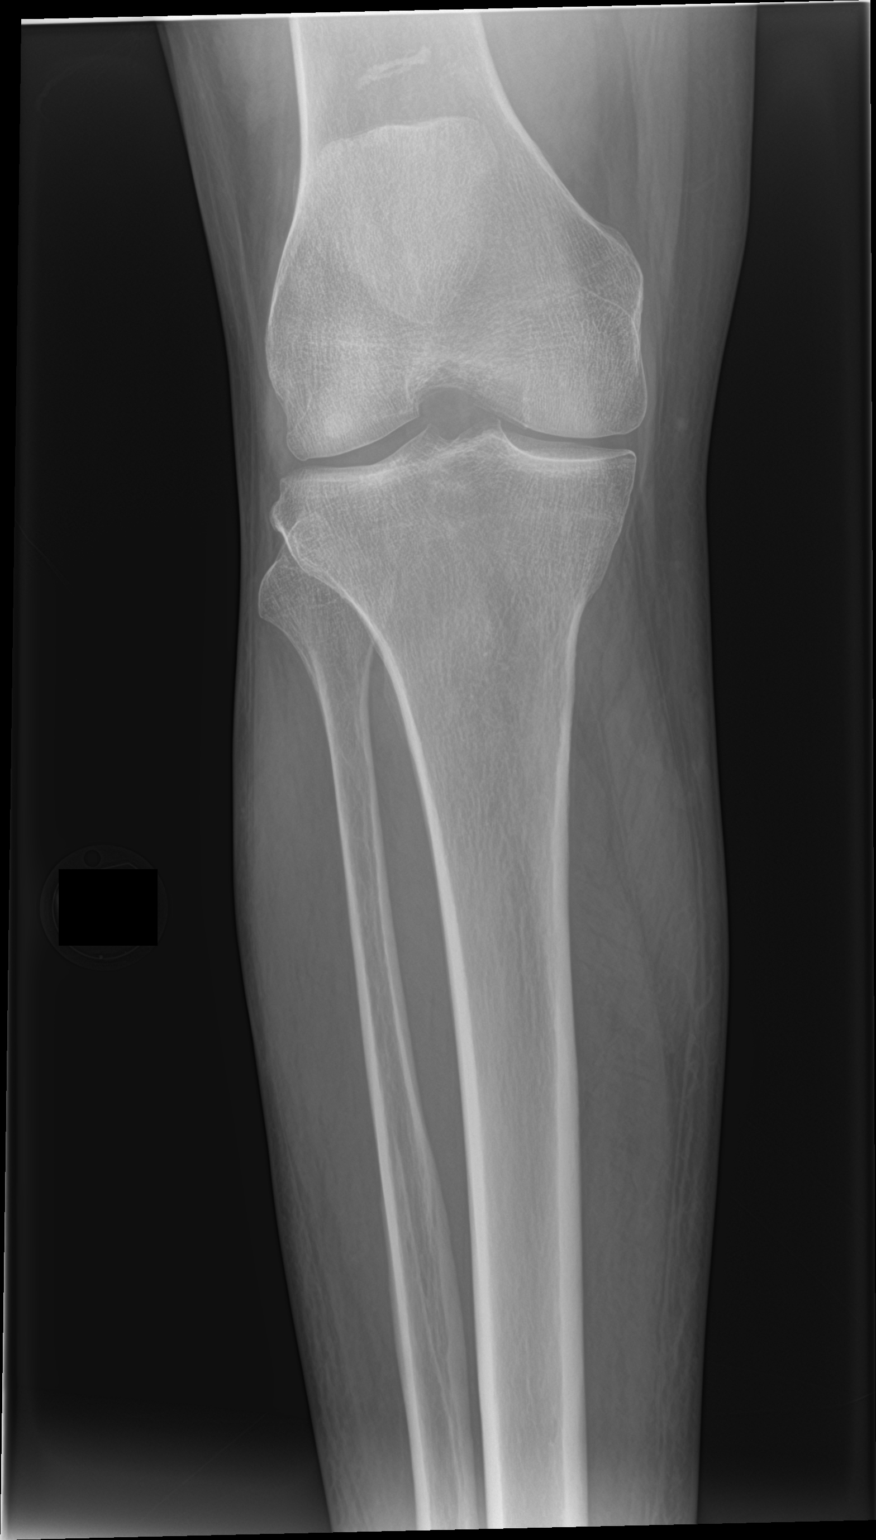

[tibia ap (2 of 2)]
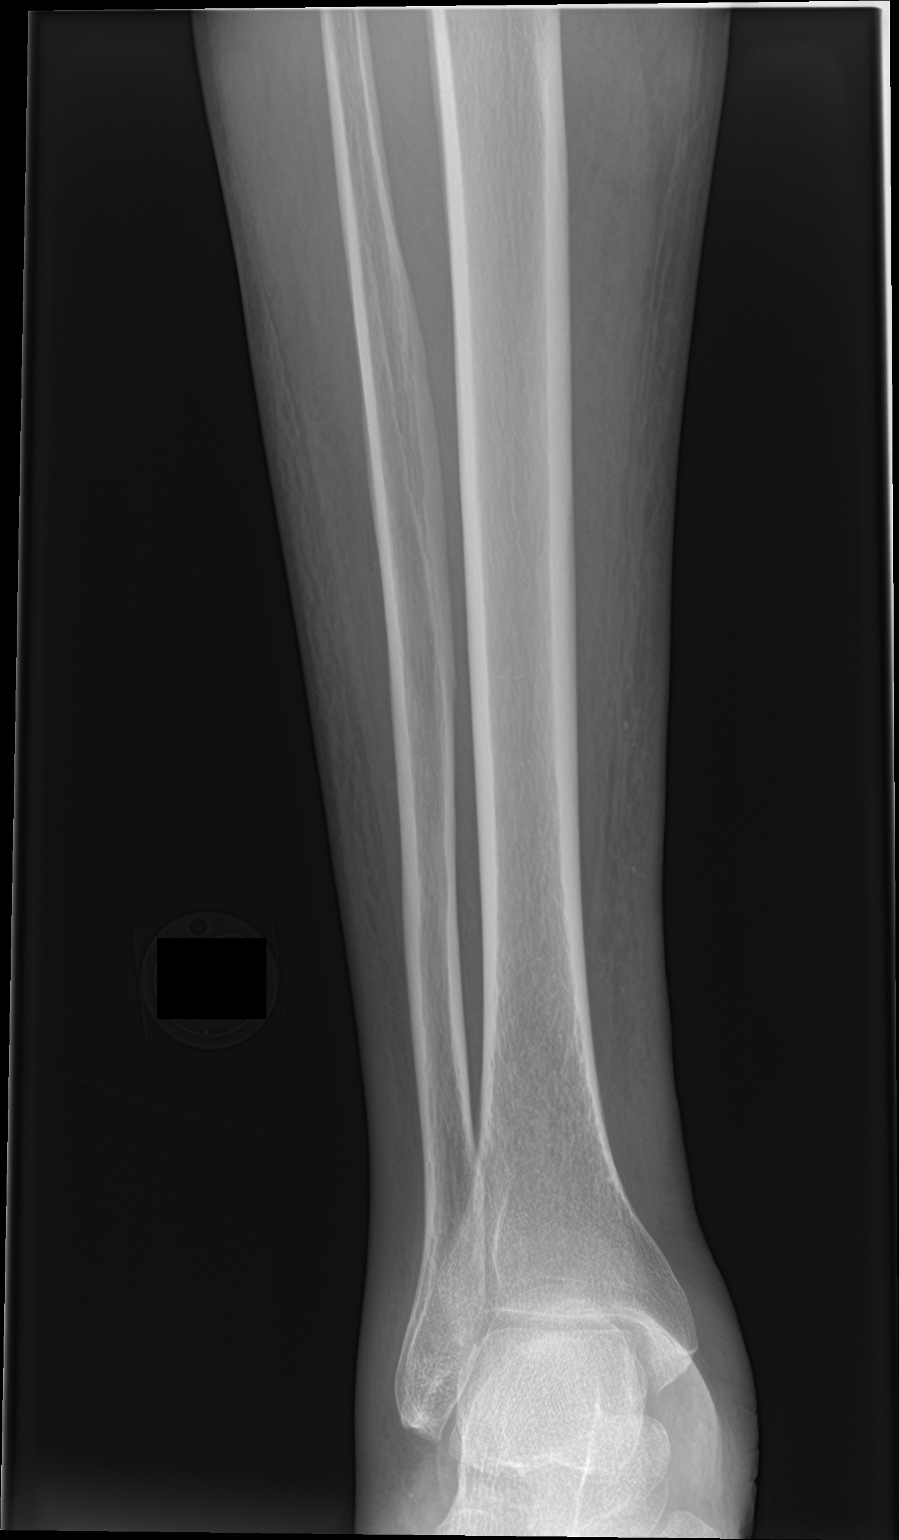

[tibia lat (1 of 2)]
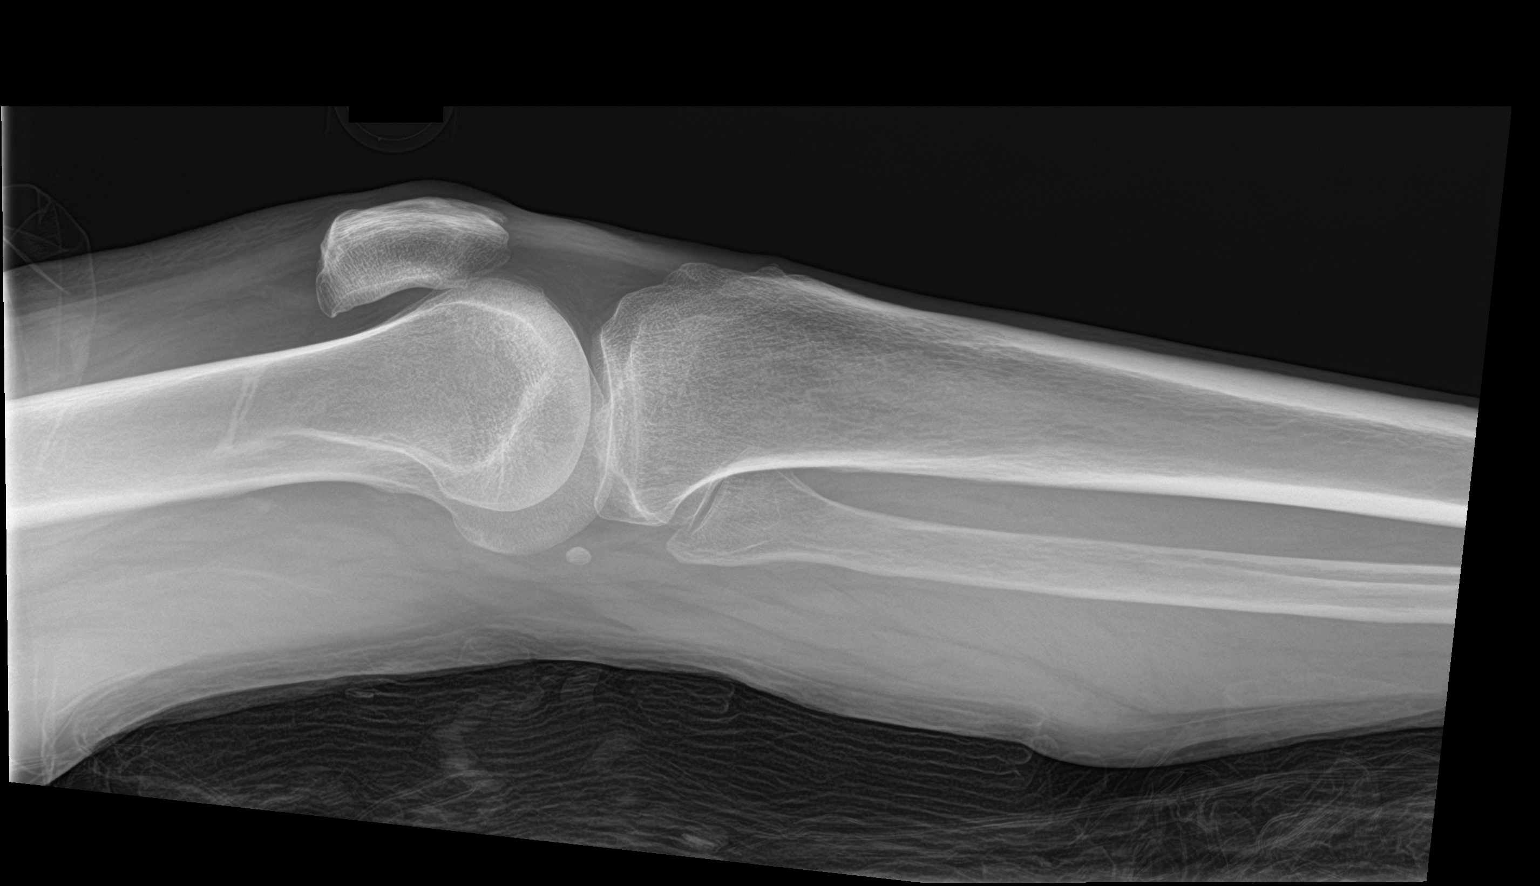

[tibia lat (2 of 2)]
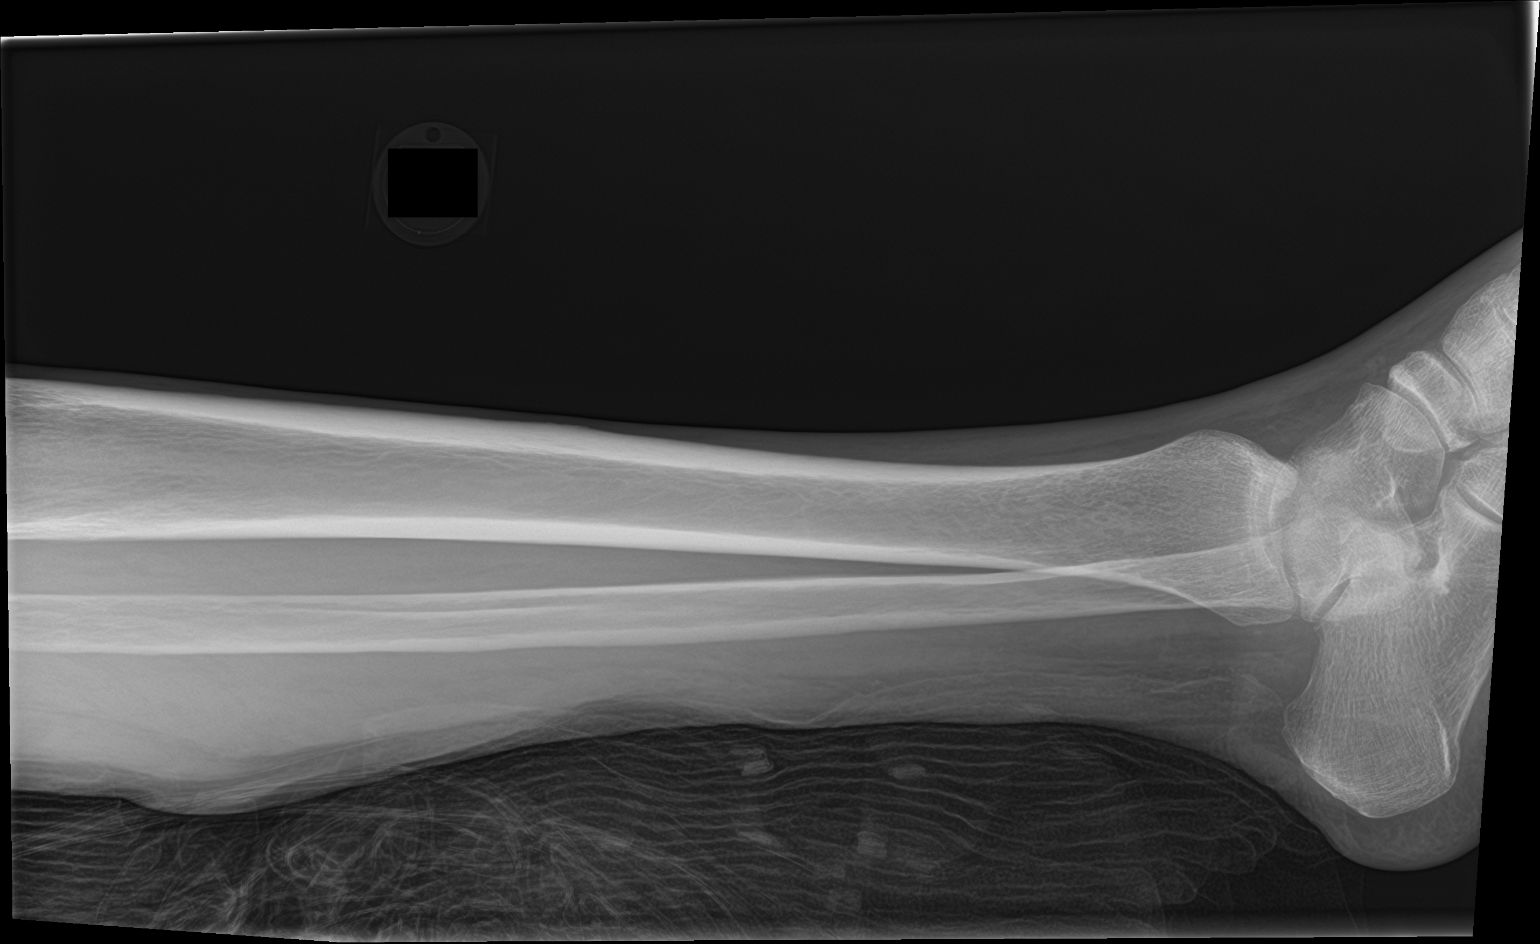

[4 of 4 positions shown; findings below may reference images not displayed]

FINDINGS: There is no evidence of fracture or other focal bone lesions. Soft
tissues are edematous but there is no soft tissue emphysema.
IMPRESSION: No acute osseous abnormality or soft tissue emphysema.

## 2018-09-29 IMAGING — CT CT HEAD W/O CM
4 series · 17 of 47 positions shown, 19 images · non-contrast
Comparison: Head CT 11/11/2014, brain MRI 03/18/2017

CLINICAL DATA: Headache, hypertension

EXAM:
CT HEAD WITHOUT CONTRAST
TECHNIQUE: Contiguous axial images were obtained from the base of the skull
through the vertex without intravenous contrast.

[Series 3: head without · axial · non-contrast · 0.44mm/px · z∈[-144,-14]mm · 7 of 36 slices shown, 9 images]
[im 5/36  brain]
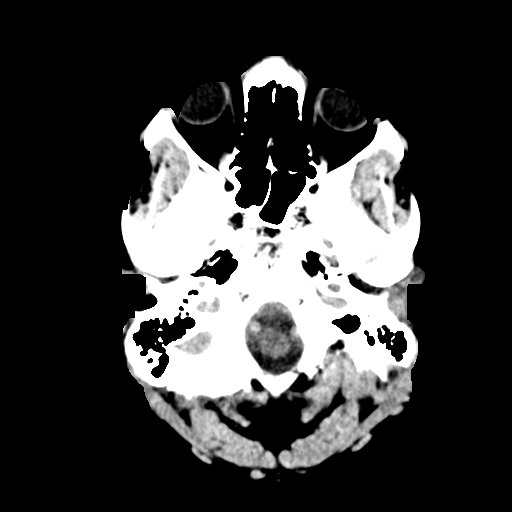
[im 5/36  bone]
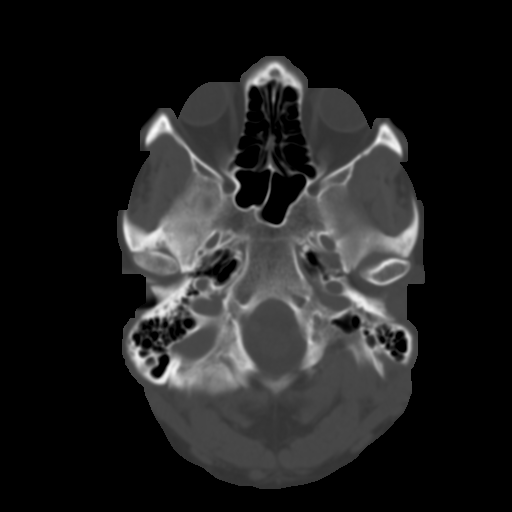
[im 9/36  brain]
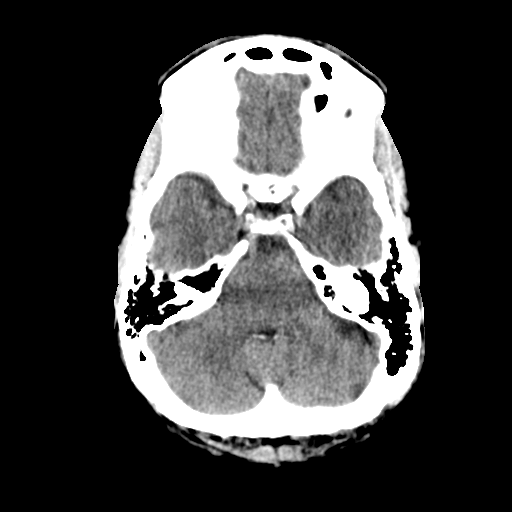
[im 14/36  brain]
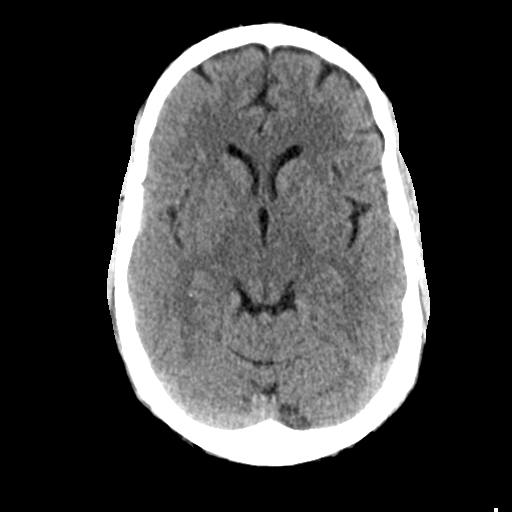
[im 18/36  brain]
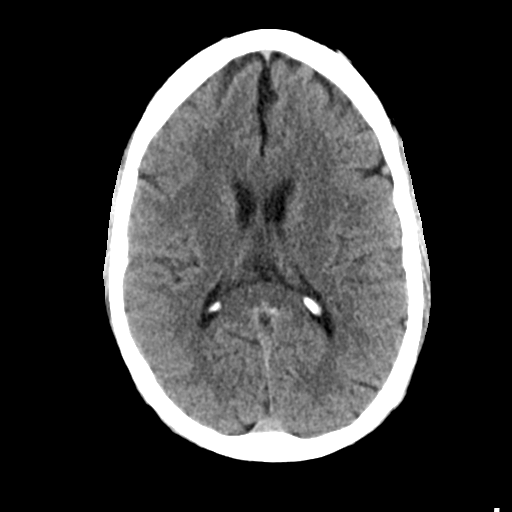
[im 22/36  brain]
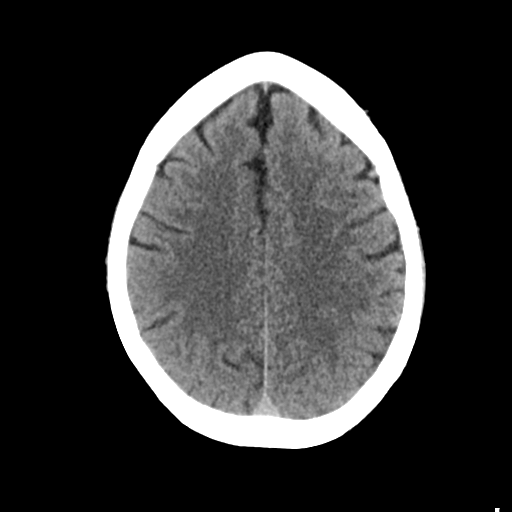
[im 22/36  bone]
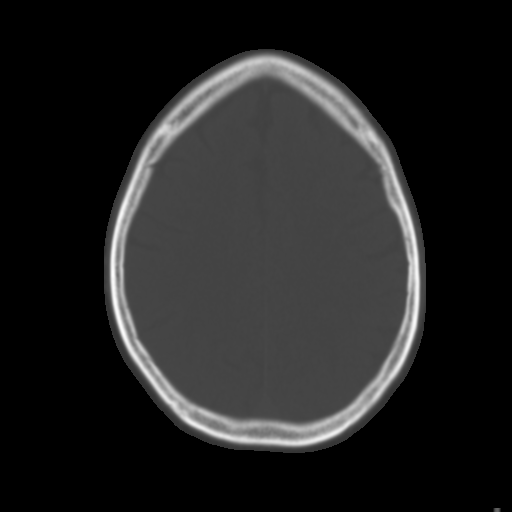
[im 27/36  brain]
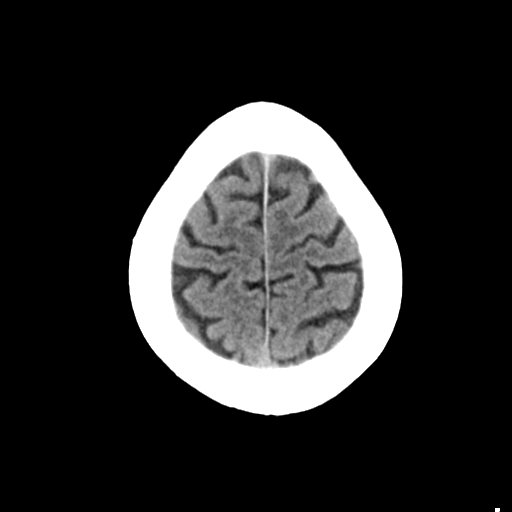
[im 31/36  brain]
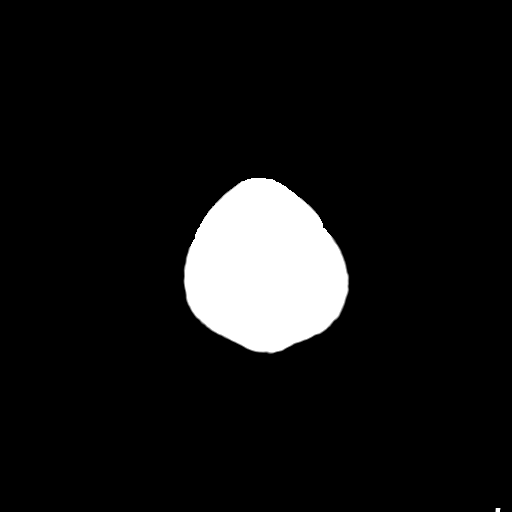

[Series 4: head bone · axial · 0.44mm/px · z∈[-148,-86]mm · 4 of 89 slices shown]
[im 9/89  bone]
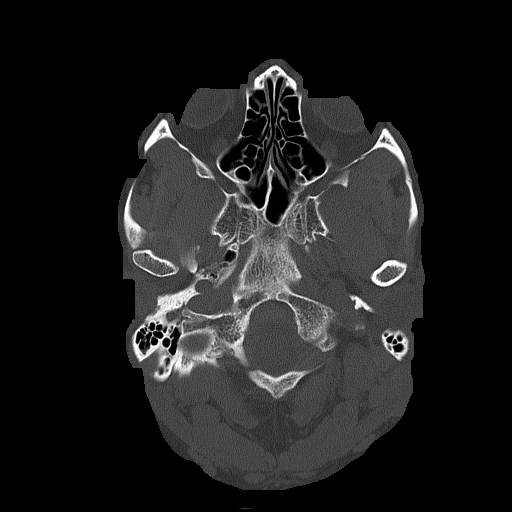
[im 18/89  bone]
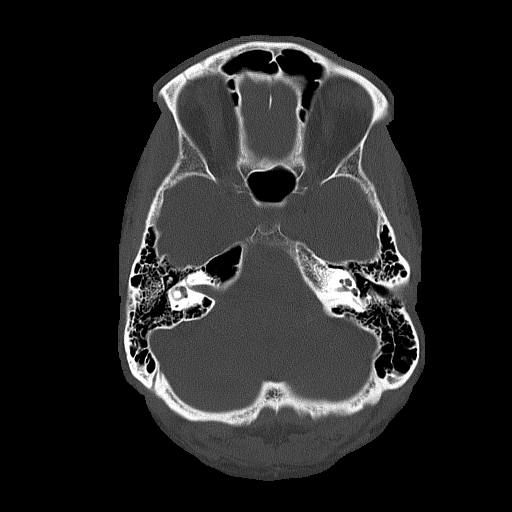
[im 27/89  bone]
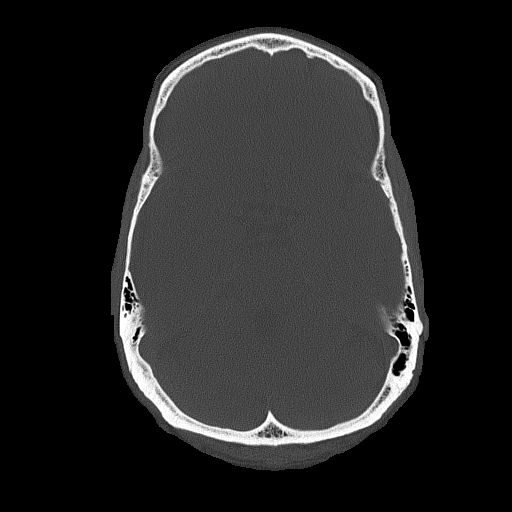
[im 40/89  bone]
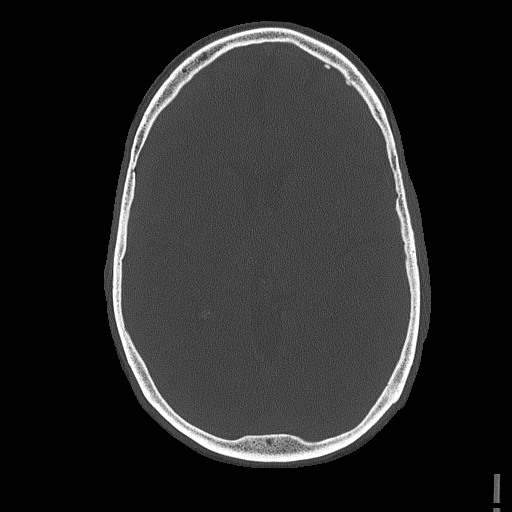

[Series 5: head without cor · coronal · non-contrast · 0.35mm/px · 3 of 75 slices shown]
[im 25/75  brain]
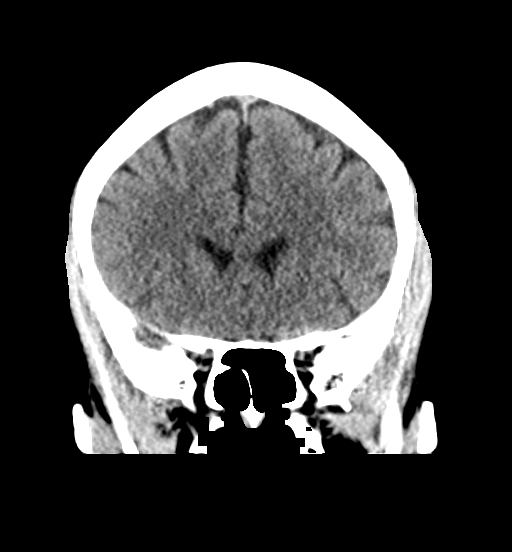
[im 33/75  brain]
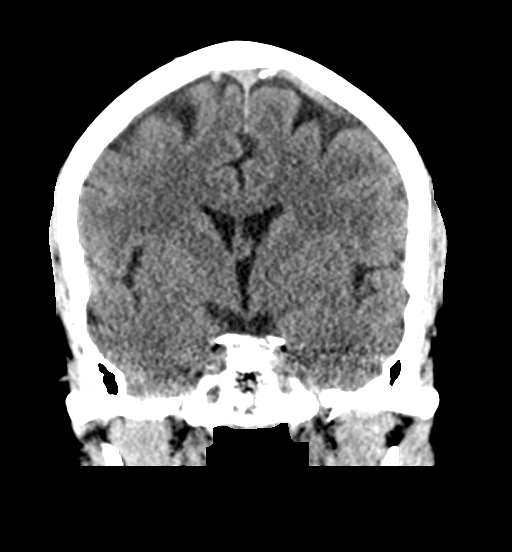
[im 42/75  brain]
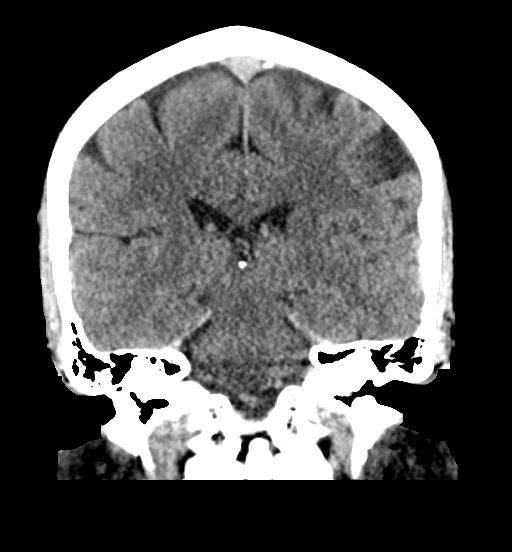

[Series 6: head without sag · sagittal · non-contrast · 0.37mm/px · 3 of 67 slices shown]
[im 23/67  brain]
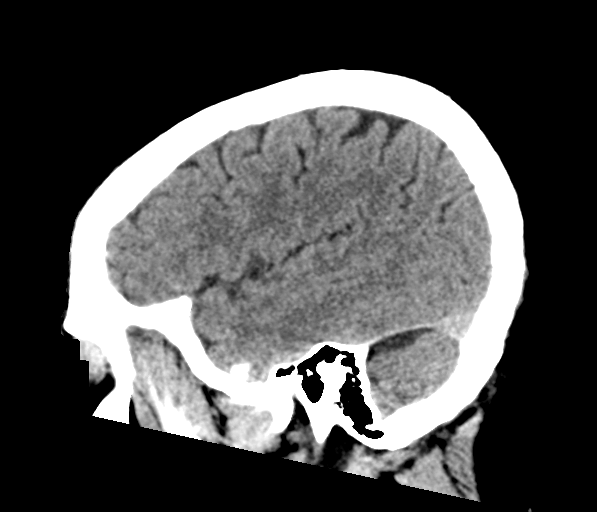
[im 34/67  brain]
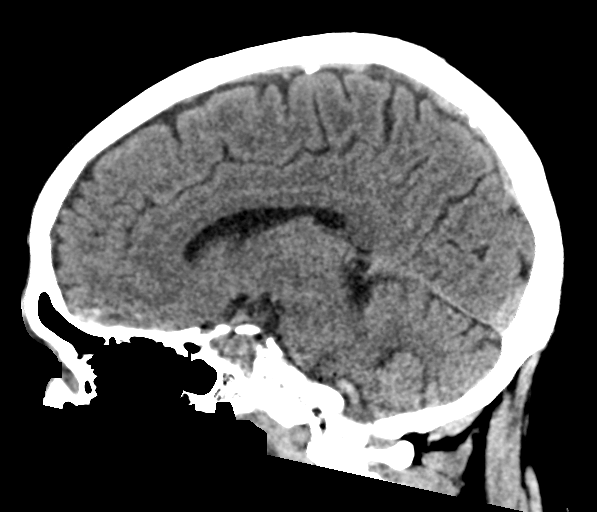
[im 45/67  brain]
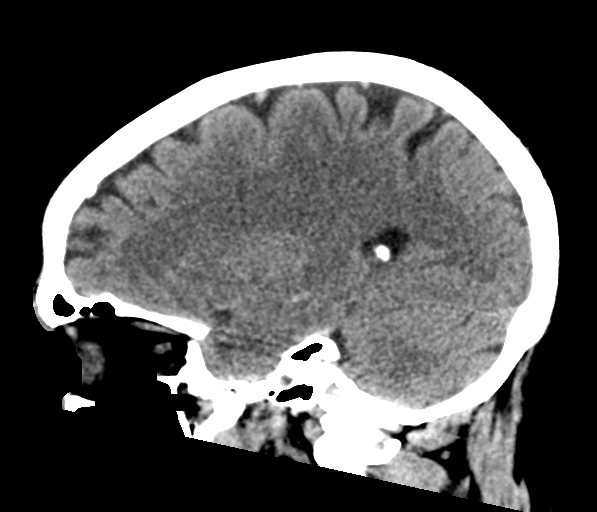

[17 of 47 positions shown; findings below may reference images not displayed]

FINDINGS: Brain: No acute intracranial hemorrhage. No focal mass lesion. No CT
evidence of acute infarction. No midline shift or mass effect. No
hydrocephalus. Basilar cisterns are patent.

Vascular: No hyperdense vessel or unexpected calcification.

Skull: Normal. Negative for fracture or focal lesion.

Sinuses/Orbits: Paranasal sinuses and mastoid air cells are clear.
Orbits are clear.

Other: None.
IMPRESSION: 1. No acute intracranial findings.
2. White matter changes on MRI not well appreciated on CT.

## 2018-10-01 IMAGING — MR MR [PERSON_NAME] LOW W/O CM*R*
7 of 11 series · 27 of 40 positions shown · IV contrast (multihance)
Comparison: 05/06/2017 radiographs

CLINICAL DATA: Right lower extremity pain and swelling for 3 weeks.

EXAM:
MRI OF LOWER RIGHT EXTREMITY WITHOUT CONTRAST; MRI OF THE RIGHT
ANKLE WITHOUT AND WITH CONTRAST
TECHNIQUE: Multiplanar, multisequence MR imaging of the right lower extremity
was performed. No intravenous contrast was administered for the
right tibia/ fibula. Contrast was administered for the ankle
examination.
CONTRAST:  20 cc MultiHance.

[Series 3: T1 · axial · 3.0mm · 0.35mm/px · z∈[-28,+102]mm · 5 of 39 slices shown (1 of 3)]
[im 1/39]
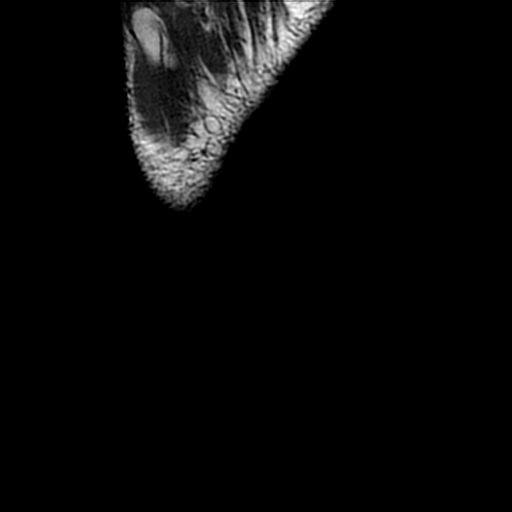
[im 10/39]
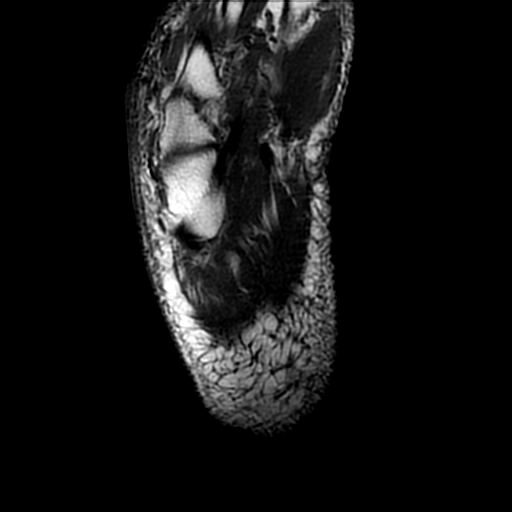
[im 20/39]
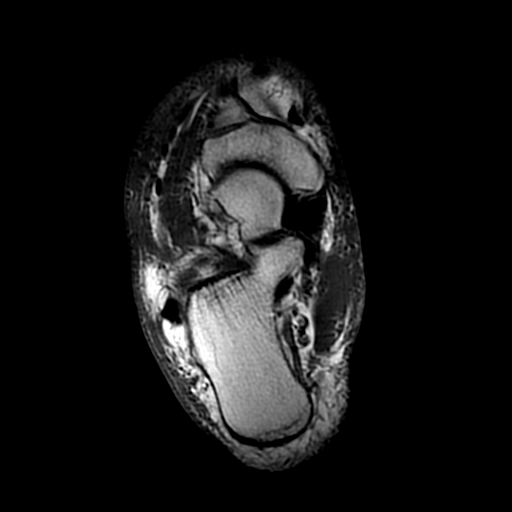
[im 29/39]
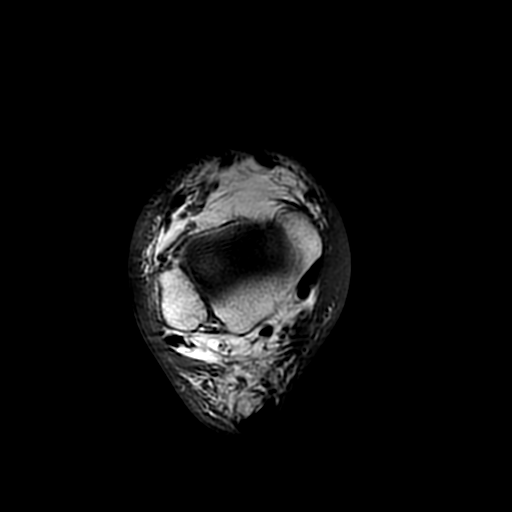
[im 39/39]
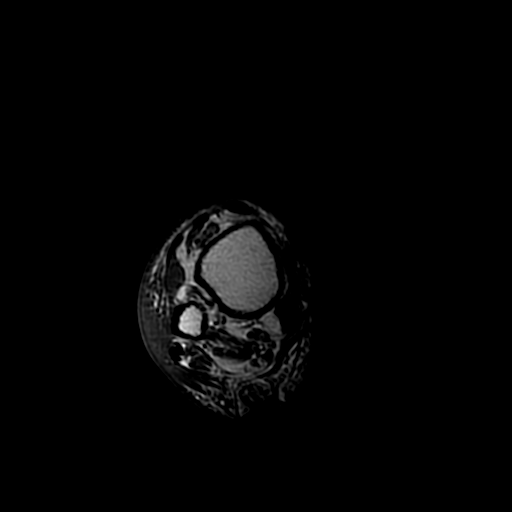

[Series 4: T1 fat-sat · axial · non-contrast · 3.0mm · 0.35mm/px · z∈[-28,+102]mm · 5 of 39 slices shown]
[im 1/39]
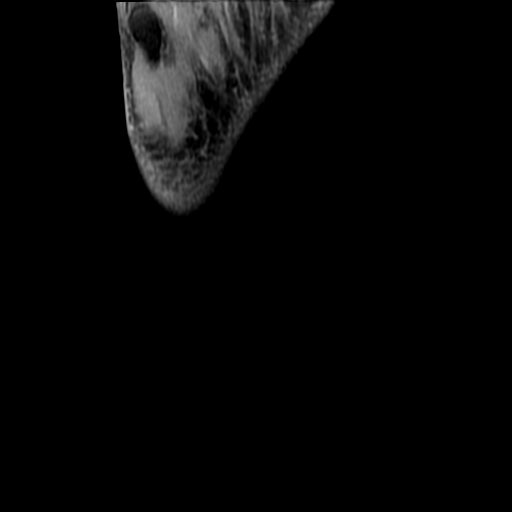
[im 10/39]
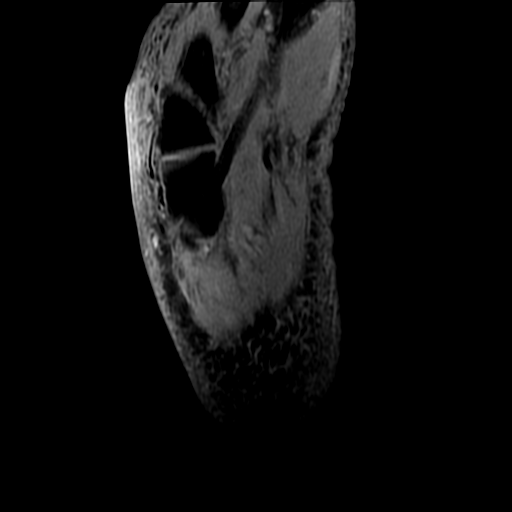
[im 20/39]
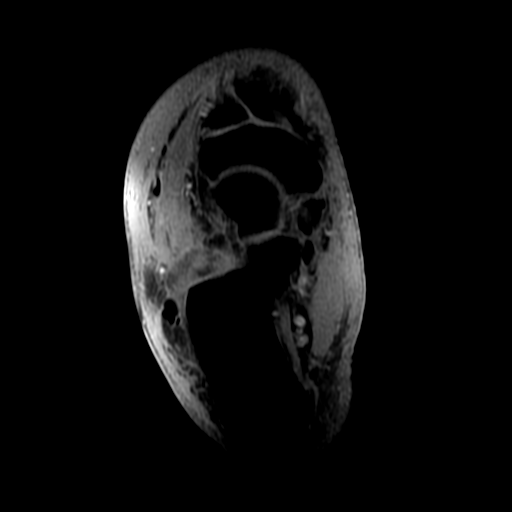
[im 29/39]
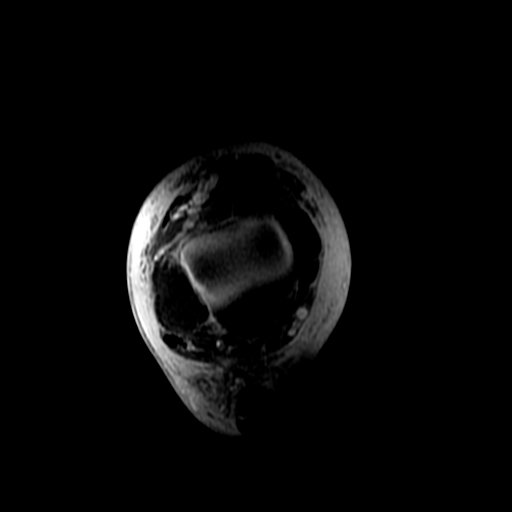
[im 39/39]
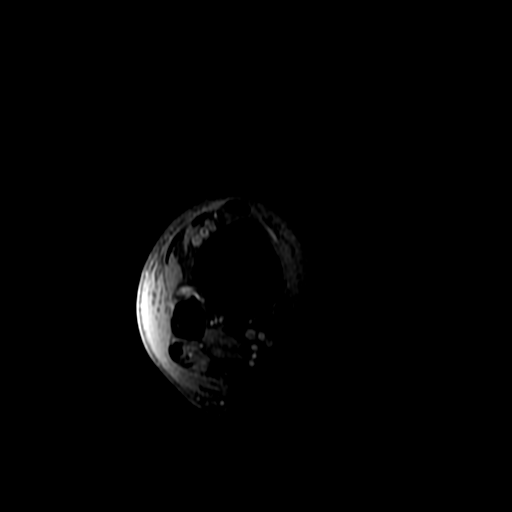

[Series 5: PD fat-sat · axial · 3.0mm · 0.70mm/px · z∈[-28,+102]mm · 4 of 39 slices shown]
[im 1/39]
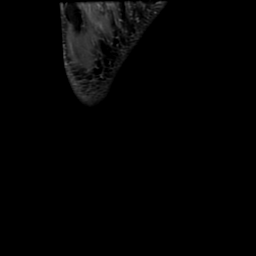
[im 13/39]
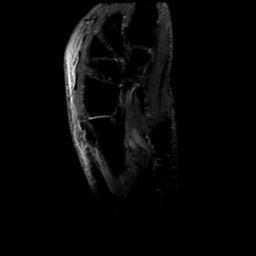
[im 26/39]
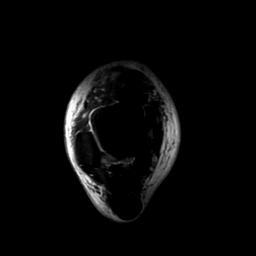
[im 39/39]
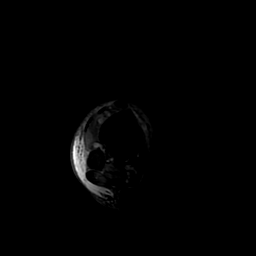

[Series 6: T2 fat-sat · axial · 3.0mm · 0.70mm/px · z∈[-28,+102]mm · 4 of 39 slices shown (1 of 2)]
[im 1/39]
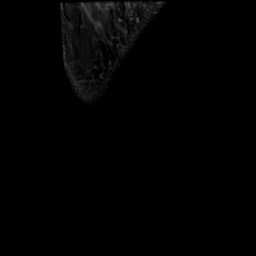
[im 13/39]
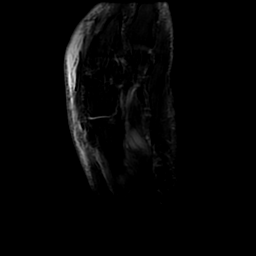
[im 26/39]
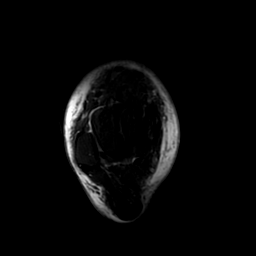
[im 39/39]
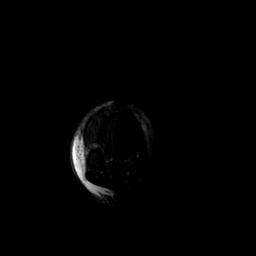

[Series 8: T2 fat-sat · coronal · 4.0mm · 0.74mm/px · 3 of 30 slices shown (2 of 2)]
[im 1/30]
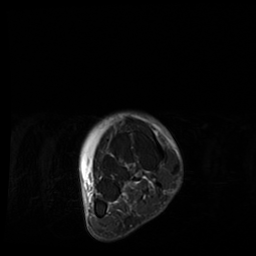
[im 15/30]
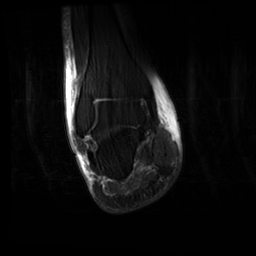
[im 30/30]
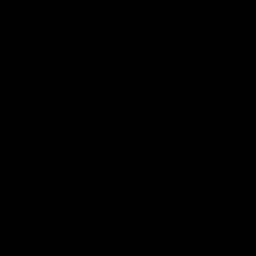

[Series 9: T1 · coronal · 4.0mm · 0.37mm/px · 3 of 30 slices shown (2 of 3)]
[im 1/30]
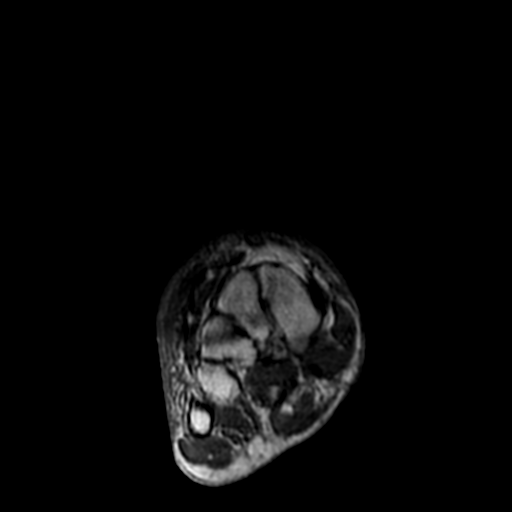
[im 15/30]
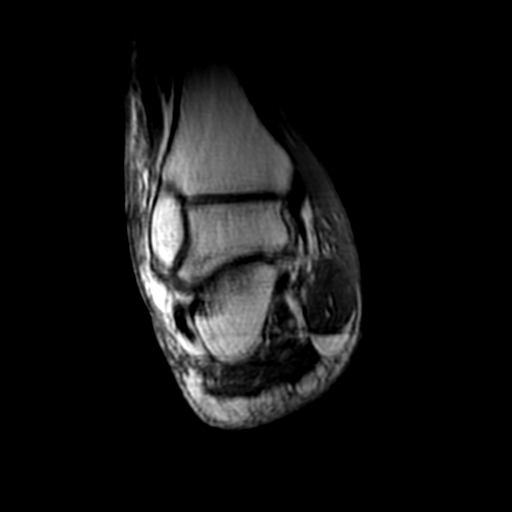
[im 30/30]
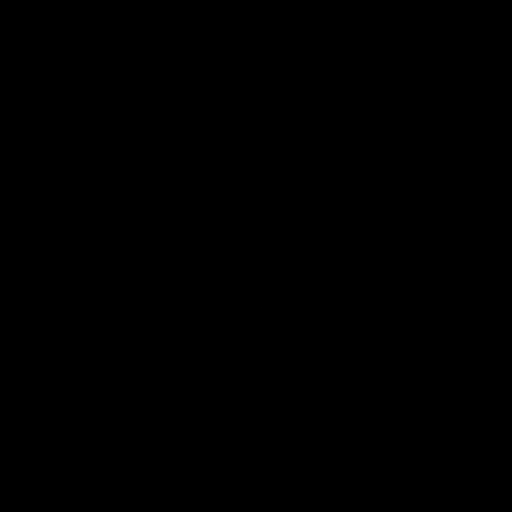

[Series 10: T1 · sagittal · 3.0mm · 0.39mm/px · 3 of 31 slices shown (3 of 3)]
[im 1/31]
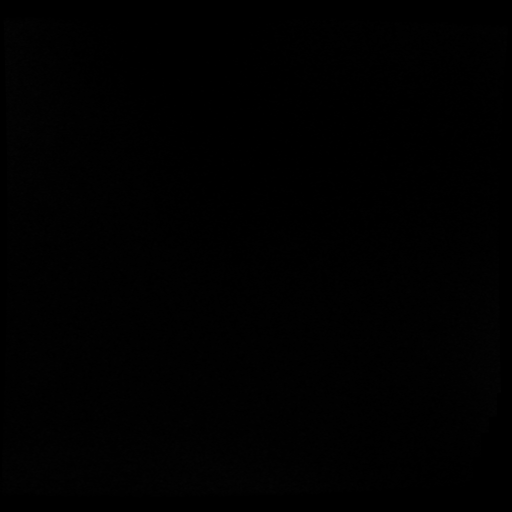
[im 16/31]
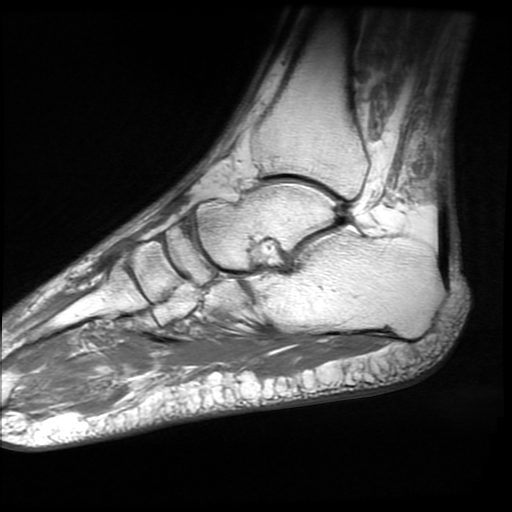
[im 31/31]
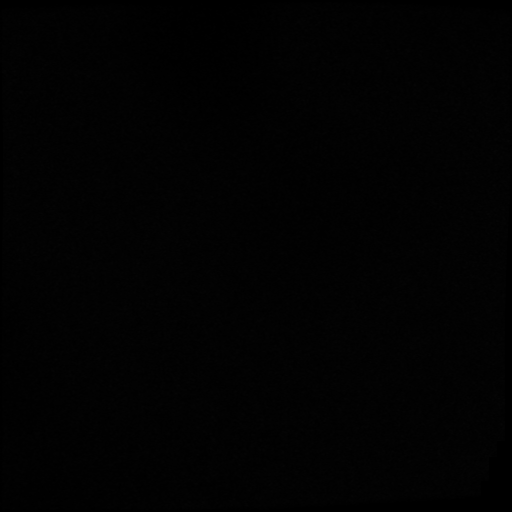

[27 of 40 positions shown; findings below may reference images not displayed]

FINDINGS: Diffuse subcutaneous soft tissue swelling/ edema/ fluid involving
the right lower extremity suggesting cellulitis. There is also
moderate edema like signal abnormality in the anterior tibialis
muscle suggesting myositis. No discrete drainable soft tissue
abscess is identified.

The knee and ankle joints are maintained. No findings for septic
arthritis. No findings for osteomyelitis involving the right tibia
or fibula.

Diffuse cellulitis and myositis involving the foot and ankle. No
findings for discrete drainable soft tissue abscess or pyomyositis.
No evidence of septic arthritis or osteomyelitis.
IMPRESSION: Cellulitis and myositis but no discrete drainable soft tissue
abscess or pyomyositis.

No MR findings to suggest septic arthritis or osteomyelitis.

## 2018-10-11 IMAGING — US US RENAL
1 series · 14 of 25 positions shown · non-contrast
Comparison: CT scan from yesterday

CLINICAL DATA: Acute renal injury

EXAM:
RENAL / URINARY TRACT ULTRASOUND COMPLETE

[Series 1: us renal · 0.23mm/px · 14 of 40 slices shown]
[im 1/40]
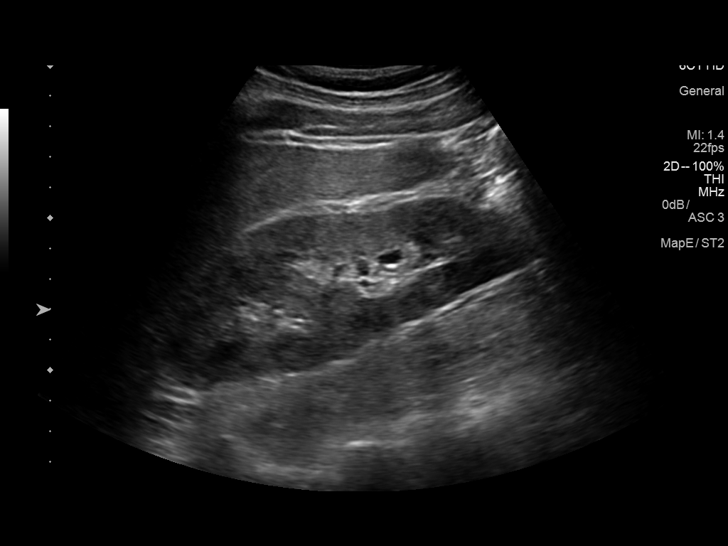
[im 4/40]
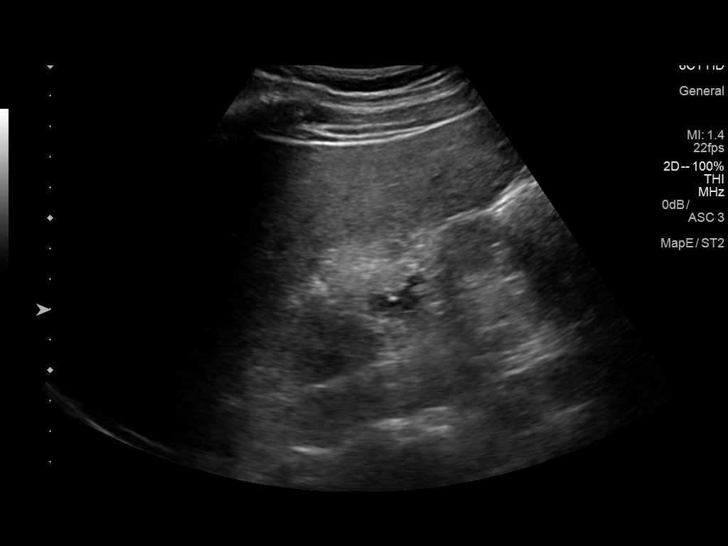
[im 7/40]
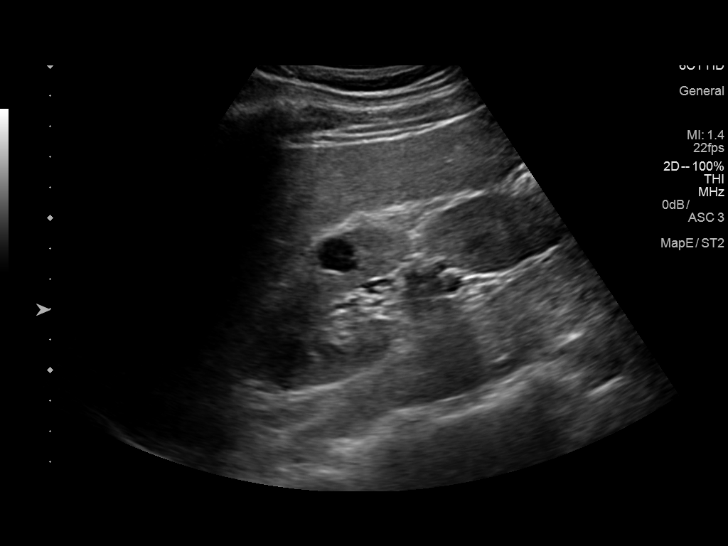
[im 10/40]
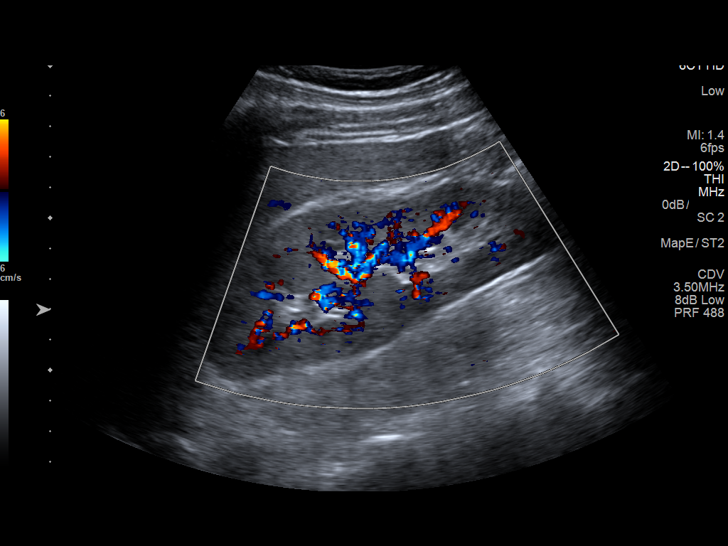
[im 14/40]
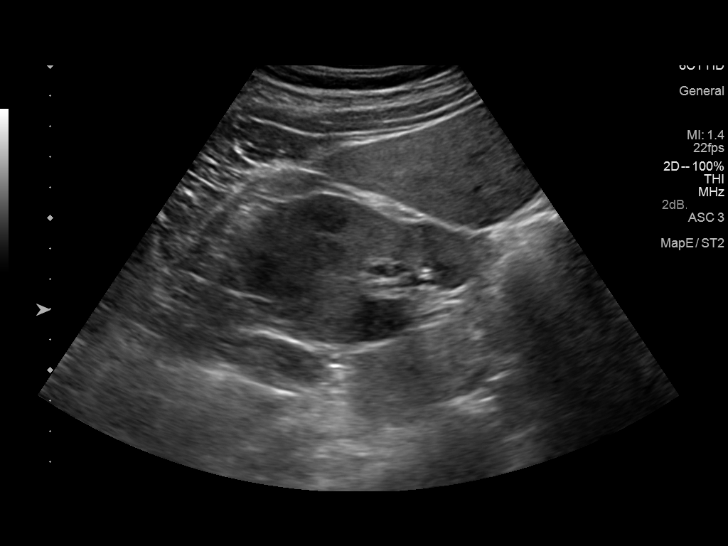
[im 15/40]
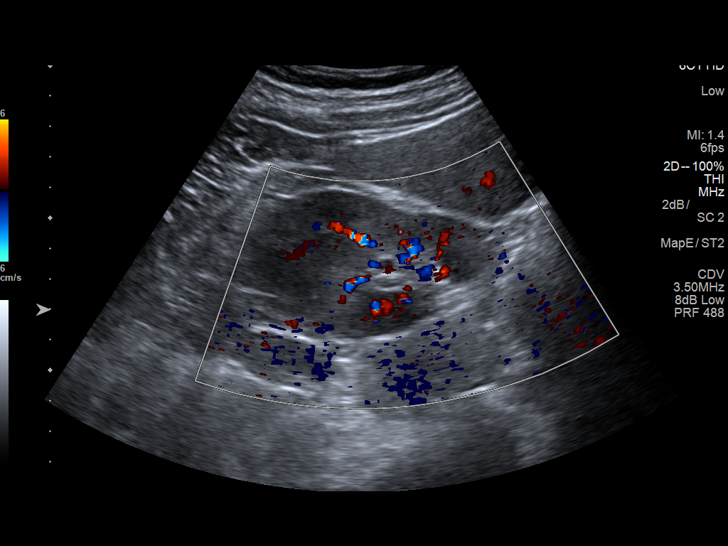
[im 18/40]
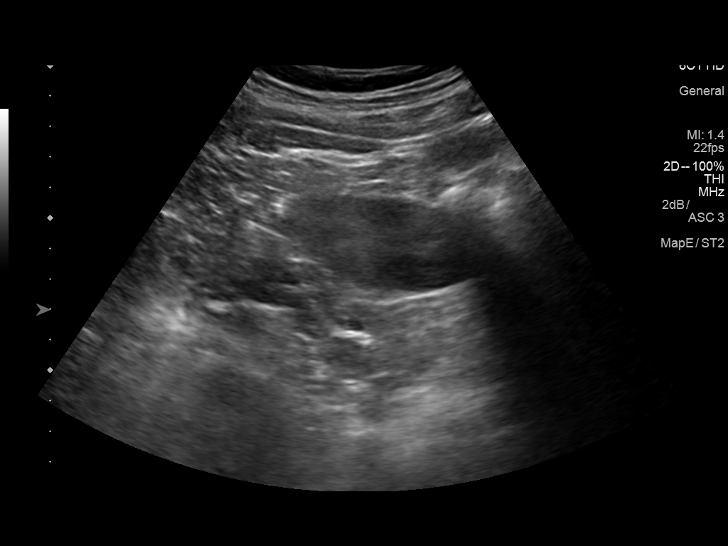
[im 22/40]
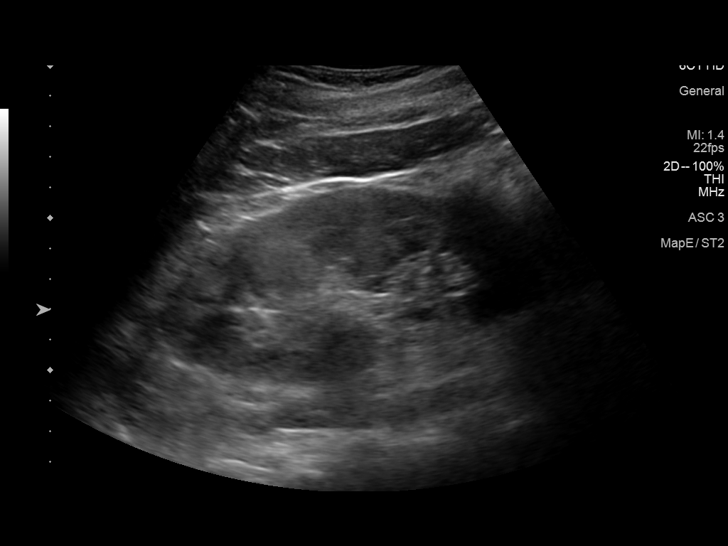
[im 25/40]
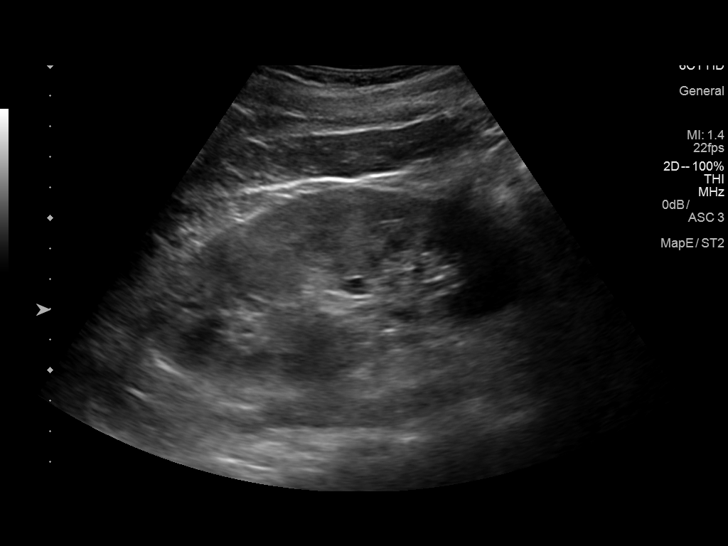
[im 27/40]
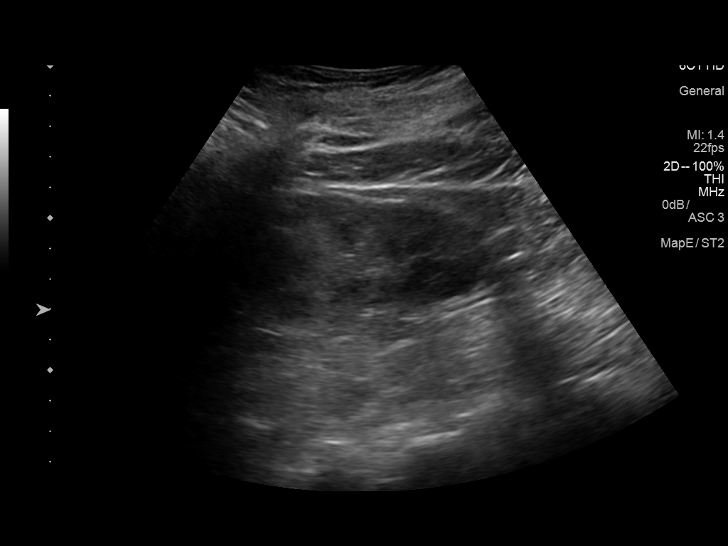
[im 30/40]
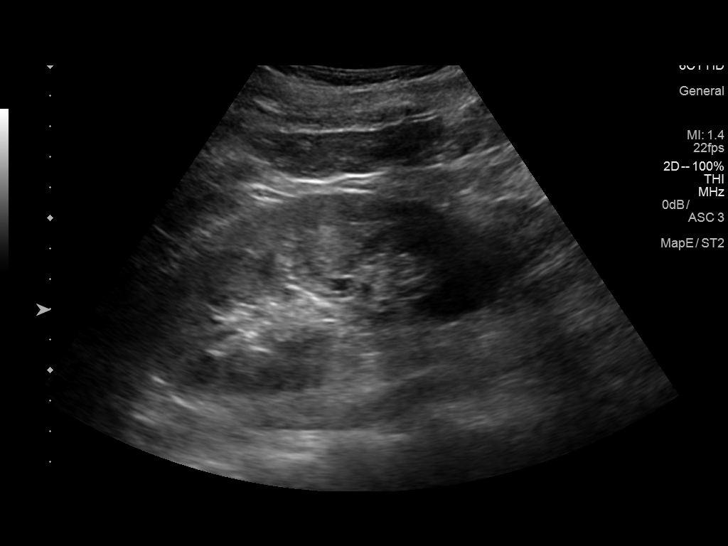
[im 33/40]
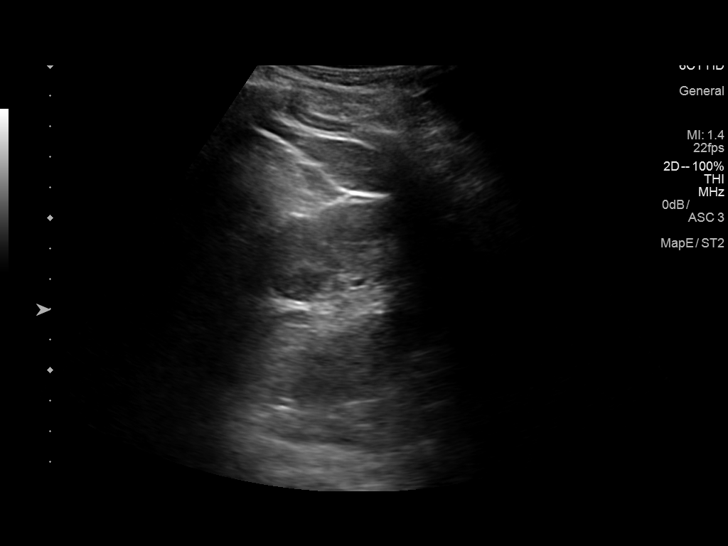
[im 36/40]
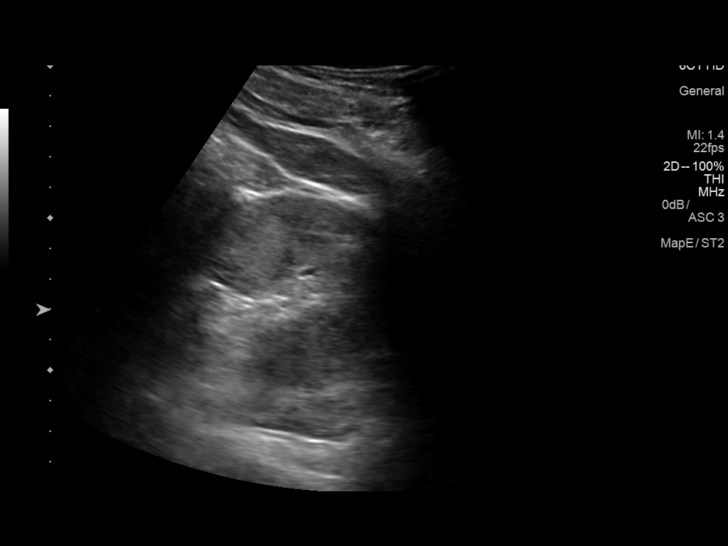
[im 40/40]
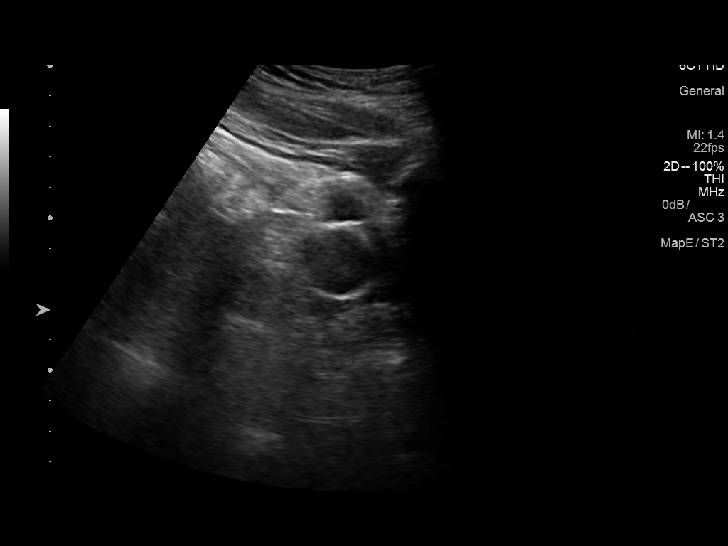

[14 of 25 positions shown; findings below may reference images not displayed]

FINDINGS: Right Kidney:

Length: 13.5 cm.  1.4 cm cyst in the right kidney.

Left Kidney:

Length: 13.8 cm. Echogenicity within normal limits. No mass or
hydronephrosis visualized.

Bladder:

The bladder is decompressed with a Foley catheter.
IMPRESSION: No acute abnormalities identified.  Right renal cyst.

## 2018-10-28 ENCOUNTER — Ambulatory Visit (INDEPENDENT_AMBULATORY_CARE_PROVIDER_SITE_OTHER): Payer: 59 | Admitting: Psychology

## 2018-10-28 DIAGNOSIS — G894 Chronic pain syndrome: Secondary | ICD-10-CM | POA: Diagnosis not present

## 2018-10-28 DIAGNOSIS — F1190 Opioid use, unspecified, uncomplicated: Secondary | ICD-10-CM

## 2018-11-04 ENCOUNTER — Encounter (HOSPITAL_COMMUNITY): Payer: Self-pay | Admitting: Internal Medicine

## 2018-11-04 ENCOUNTER — Emergency Department (HOSPITAL_COMMUNITY)
Admission: EM | Admit: 2018-11-04 | Discharge: 2018-11-05 | Disposition: A | Discharge status: Home / Self Care | Payer: 59 | Attending: Emergency Medicine | Admitting: Emergency Medicine

## 2018-11-04 ENCOUNTER — Emergency Department (HOSPITAL_COMMUNITY): Payer: 59

## 2018-11-04 DIAGNOSIS — Z86718 Personal history of other venous thrombosis and embolism: Secondary | ICD-10-CM | POA: Insufficient documentation

## 2018-11-04 DIAGNOSIS — Z79899 Other long term (current) drug therapy: Secondary | ICD-10-CM | POA: Insufficient documentation

## 2018-11-04 DIAGNOSIS — G35 Multiple sclerosis: Secondary | ICD-10-CM | POA: Insufficient documentation

## 2018-11-04 DIAGNOSIS — I10 Essential (primary) hypertension: Secondary | ICD-10-CM | POA: Insufficient documentation

## 2018-11-04 DIAGNOSIS — F419 Anxiety disorder, unspecified: Secondary | ICD-10-CM | POA: Insufficient documentation

## 2018-11-04 DIAGNOSIS — R0789 Other chest pain: Secondary | ICD-10-CM | POA: Diagnosis present

## 2018-11-04 DIAGNOSIS — Z87891 Personal history of nicotine dependence: Secondary | ICD-10-CM | POA: Insufficient documentation

## 2018-11-04 DIAGNOSIS — G8929 Other chronic pain: Secondary | ICD-10-CM | POA: Diagnosis not present

## 2018-11-04 DIAGNOSIS — Z7901 Long term (current) use of anticoagulants: Secondary | ICD-10-CM | POA: Diagnosis not present

## 2018-11-04 LAB — CBC
HCT: 41.5 % (ref 39.0–52.0)
Hemoglobin: 13.1 g/dL (ref 13.0–17.0)
MCH: 27.3 pg (ref 26.0–34.0)
MCHC: 31.6 g/dL (ref 30.0–36.0)
MCV: 86.5 fL (ref 80.0–100.0)
Platelets: 262 10*3/uL (ref 150–400)
RBC: 4.8 MIL/uL (ref 4.22–5.81)
RDW: 15 % (ref 11.5–15.5)
WBC: 4.8 10*3/uL (ref 4.0–10.5)
nRBC: 0 % (ref 0.0–0.2)

## 2018-11-04 LAB — BASIC METABOLIC PANEL
Anion gap: 5 (ref 5–15)
BUN: 7 mg/dL (ref 6–20)
CO2: 25 mmol/L (ref 22–32)
Calcium: 8.7 mg/dL — ABNORMAL LOW (ref 8.9–10.3)
Chloride: 111 mmol/L (ref 98–111)
Creatinine, Ser: 1.06 mg/dL (ref 0.61–1.24)
GFR calc Af Amer: >60 mL/min (ref >60)
GFR calc non Af Amer: >60 mL/min (ref >60)
Glucose, Bld: 150 mg/dL — ABNORMAL HIGH (ref 70–99)
Potassium: 3.1 mmol/L — ABNORMAL LOW (ref 3.5–5.1)
Sodium: 141 mmol/L (ref 135–145)

## 2018-11-04 LAB — I-STAT TROPONIN, ED
Troponin i, poc: 0 ng/mL (ref 0.00–0.08)
Troponin i, poc: 0.01 ng/mL (ref 0.00–0.08)

## 2018-11-04 MED ORDER — POTASSIUM CHLORIDE CRYS ER 20 MEQ PO TBCR
40.0000 meq | EXTENDED_RELEASE_TABLET | Freq: Once | ORAL | Status: AC
  Administered 2018-11-04: 40 meq via ORAL
  Filled 2018-11-04: qty 2

## 2018-11-04 MED ORDER — TRAMADOL HCL 50 MG PO TABS: 50.0000 mg | ORAL_TABLET | Freq: Four times a day (QID) | ORAL | 0 refills | Status: AC | PRN

## 2018-11-04 MED ORDER — HYDROXYZINE HCL 25 MG PO TABS: 25.0000 mg | ORAL_TABLET | Freq: Four times a day (QID) | ORAL | 0 refills | Status: AC

## 2018-11-04 MED ORDER — HEPARIN SOD (PORK) LOCK FLUSH 100 UNIT/ML IV SOLN
500.0000 [IU] | Freq: Once | INTRAVENOUS | Status: AC
  Administered 2018-11-05: 500 [IU]
  Filled 2018-11-04: qty 5

## 2018-11-04 MED ORDER — MORPHINE SULFATE (PF) 4 MG/ML IV SOLN
6.0000 mg | Freq: Once | INTRAVENOUS | Status: AC
  Administered 2018-11-04: 6 mg via INTRAVENOUS
  Filled 2018-11-04: qty 2

## 2018-11-04 MED ORDER — ONDANSETRON HCL 4 MG/2ML IJ SOLN
4.0000 mg | Freq: Once | INTRAMUSCULAR | Status: AC
  Administered 2018-11-04: 4 mg via INTRAVENOUS
  Filled 2018-11-04: qty 2

## 2018-11-04 MED ORDER — SODIUM CHLORIDE 0.9 % IV BOLUS
1000.0000 mL | Freq: Once | INTRAVENOUS | Status: AC
  Administered 2018-11-04: 1000 mL via INTRAVENOUS

## 2018-11-04 NOTE — Discharge Instructions (Signed)
Please follow up closely with your doctor for further management of your health.  Your pain is likely due to MS flare. Take tramadol as needed for pain.  For your anxiety, you may take vistaril as needed.  Follow up with psychiatrist for further management of your mental health.  You can use resources below.

## 2018-11-04 NOTE — ED Provider Notes (Signed)
[facility] EMERGENCY DEPARTMENT Provider Note   CSN: [id number] Arrival date & time: 11/04/18  1841     History   Chief Complaint Chief Complaint  Patient presents with  . Chest Pain    HPI [name] is a 57 y.o. male.  The history is provided by the patient and medical records. No language interpreter was used.  \  57 year old male with history of hypertension, prior DVT and PE on Xarelto, chronic pain syndrome, GERD, gallstone, MS brought here via EMS from home for evaluation of chest pain.  Patient is not a great historian however he report for the past 5 days he has had headache, arm pains, chest pain, body aches, leg pain that is moderate to severe, and felt similar to priors MS flare.  Symptom is been persistent despite taking his regular medication.  He endorsed some mild diarrhea.  He denies any fever or chills, no nausea or vomiting.  He admits to occasional alcohol and tobacco use with occasional marijuana use but denies any cocaine use.  He follow-up with the pain clinic.  He mention been compliant with his medication.  No significant cardiac history.  Past Medical History:  Diagnosis Date  . Abdominal pain, unspecified site   . Anxiety   . Arthritis   . Benign paroxysmal positional vertigo   . Cellulitis and abscess of right leg 04/2017  . Chronic back pain   . Chronic pain syndrome 01/25/2008  . Cluster headache   . Depression    takes Zoloft daily  . DVT (deep venous thrombosis) (HCC)    in the left arm '09  . Gait abnormality    "uses mobile wheelchair, but is ambulatory"  . Gallstones 02/17/2009   resolved after gallbladder surgery.  [name] GERD (gastroesophageal reflux disease)    takes Omeprazole as needed  . Headache(784.0)    cluster headaches frequently-takes Topamax daily  . History of colonoscopy   . HTN (hypertension)    takes Lisinopril,Verapamil,and Triamterene HCTZ daily  . Insomnia 11/06/2008  . Joint pain   . Joint  swelling    03-07-16 "swelling of right wrist" "after a fall-xray done 03-06-16 "no fractures".  . Memory loss    no an issue at present 03-07-16  . Multiple sclerosis (HCC)    Dx. 2005 - Dr. Tinnie Gens follows LOV 4'17 tx. Tysabri monthly IV-[facility] , [place].  [name] Nonspecific elevation of levels of transaminase or lactic acid dehydrogenase (LDH)   . Other specified visual disturbances   . Other syndromes affecting cervical region   . Pneumonia 2009  . Trigeminal neuralgia     history" Multiple sclerosis"    Patient Active Problem List   Diagnosis Date Noted  . Chronic right shoulder pain 09/21/2018  . Bilateral primary osteoarthritis of knee 09/21/2018  . Drug overdose 04/08/2018  . Suicide attempt (HCC)   . Overdose by acetaminophen, accidental or unintentional, initial encounter 03/18/2018  . Intentional acetaminophen overdose (HCC)   . Severe major depression, single episode, with psychotic features (HCC) 10/04/2017  . Weakness   . Cellulitis of right leg 05/07/2017  . Right carpal tunnel syndrome 02/16/2017  . Gait disturbance 01/22/2017  . Insomnia 01/22/2017  . Depression with anxiety 01/22/2017  . High risk medication use 01/22/2017  . Major depressive disorder, recurrent episode (HCC) 12/19/2016  . Suicidal ideation   . AKI (acute kidney injury) (HCC) 11/15/2016  . Chronic hepatitis C virus infection (HCC) 11/15/2016  . Chronic abdominal pain 11/15/2016  .  Chronic pain syndrome 11/15/2016  . Acute retention of urine 11/15/2016  . Generalized abdominal pain   . Chronic cluster headache, not intractable   . Community acquired pneumonia   . TB lung, latent   . HCAP (healthcare-associated pneumonia) 02/16/2016  . Hypokalemia 02/16/2016  . Intractable cluster headache syndrome   . DVT (deep venous thrombosis) (HCC)   . Depression   . GERD (gastroesophageal reflux disease)   . HTN (hypertension)   . Essential hypertension   . Gastroesophageal reflux  disease without esophagitis   . CAP (community acquired pneumonia)   . Multiple sclerosis (HCC) 08/02/2013  . ABDOMINAL BLOATING 10/14/2010  . LOOSE STOOLS 10/14/2010  . PULMONARY EMBOLISM, HX OF 10/14/2010    Past Surgical History:  Procedure Laterality Date  . CHOLECYSTECTOMY  02/20/2009  . COLONOSCOPY WITH PROPOFOL N/A 03/17/2016   Procedure: COLONOSCOPY WITH PROPOFOL;  Surgeon: Carman ChingJames Edwards, MD;  Location: [facility] ENDOSCOPY;  Service: Endoscopy;  Laterality: N/A;  . PORT A CATH REVISION N/A 07/06/2015   Procedure: Removal and replacement of PORT A CATH;  Surgeon: Claud KelpHaywood Ingram, MD;  [facility];  Service: General;  Laterality: N/A;  . PORTACATH PLACEMENT N/A 03/27/2014   Procedure: INSERTION PORT-A-CATH;  Surgeon: Ernestene MentionHaywood M Ingram, MD;  Location: [facility] OR;  Service: General;  Laterality: N/A;        Home Medications    Prior to Admission medications   Medication Sig Start Date End Date Taking? Authorizing Provider  baclofen (LIORESAL) 20 MG tablet Take 1 tablet (20 mg total) by mouth 4 (four) times daily. For muscle spasms Patient not taking: Reported on 03/18/2018 10/09/17   [name], Agnes I, NP  gabapentin (NEURONTIN) 600 MG tablet Take 600 mg by mouth 3 (three) times daily.  10/25/17   [provider]  hydrOXYzine (ATARAX/VISTARIL) 25 MG tablet Take 25 mg by mouth 2 (two) times daily. 03/06/18   [provider]  levETIRAcetam (KEPPRA) 500 MG tablet Take 1 tablet (500 mg total) by mouth 2 (two) times daily. 04/09/18   Zannie CoveJoseph, Preetha, MD  Ocrelizumab (OCREVUS IV) Inject into the vein every 6 (six) months.    [provider]  omeprazole (PRILOSEC) 40 MG capsule Take 1 capsule (40 mg total) by mouth every evening. For acid reflux Patient taking differently: Take 20 mg by mouth every evening. For acid reflux 10/09/17   [name], Agnes I, NP  Oxcarbazepine (TRILEPTAL) 300 MG tablet Take 1 tablet (300 mg total) by mouth 3 (three) times daily. For mood stabilization  10/09/17   [name], Agnes I, NP  oxyCODONE-acetaminophen (PERCOCET) 10-325 MG tablet Take 1 tablet by mouth every 8 (eight) hours as needed for pain. 04/09/18   Zannie CoveJoseph, Preetha, MD  risperiDONE (RISPERDAL) 0.25 MG tablet Take 1 tablet (0.25 mg total) by mouth at bedtime. 04/09/18   Zannie CoveJoseph, Preetha, MD  sertraline (ZOLOFT) 50 MG tablet Take 1 tablet (50 mg total) by mouth daily. For depression Patient not taking: Reported on 03/18/2018 10/10/17   [name], Agnes I, NP  topiramate (TOPAMAX) 100 MG tablet Take 100 mg by mouth at bedtime.  11/13/17   [provider]  traZODone (DESYREL) 100 MG tablet Take 100 mg by mouth at bedtime as needed for sleep.  12/16/17   [provider]  triamterene-hydrochlorothiazide (DYAZIDE) 37.5-25 MG capsule Take 1 each (1 capsule total) by mouth daily. For high blood pressure Patient not taking: Reported on 09/03/2018 10/09/17   [name], Agnes I, NP  XARELTO 10 MG TABS tablet Take 1  tablet (10 mg total) by mouth daily. For blood clot prevention Patient taking differently: Take 10 mg by mouth every evening. For blood clot prevention 10/09/17   Sanjuana Kava, NP    Family History Family History  Problem Relation Age of Onset  . Cancer Father   . Diabetes Mother     Social History Social History   Tobacco Use  . Smoking status: Former Smoker    Packs/day: 1.00    Years: 38.00    Pack years: 38.00    Types: Cigarettes  . Smokeless tobacco: Never Used  . Tobacco comment: cutting back  Substance Use Topics  . Alcohol use: Yes    Alcohol/week: 0.0 standard drinks    Comment: occasional  . Drug use: No    Comment: Quit 2011     Allergies   Acyclovir and related   Review of Systems Review of Systems  All other systems reviewed and are negative.    Physical Exam Updated Vital Signs BP 111/84   Pulse 70   Resp 18   Ht 5\' 9"  (1.753 m)   Wt 74.8 kg   SpO2 98%   BMI 24.37 kg/m   Physical Exam Vitals signs and nursing note reviewed.    Constitutional:      General: He is not in acute distress.    Appearance: He is well-developed.  HENT:     Head: Atraumatic.     Mouth/Throat:     Mouth: Mucous membranes are moist.  Eyes:     Conjunctiva/sclera: Conjunctivae normal.  Neck:     Musculoskeletal: Neck supple.  Cardiovascular:     Rate and Rhythm: Normal rate and regular rhythm.  Pulmonary:     Effort: Pulmonary effort is normal.     Breath sounds: Normal breath sounds.  Abdominal:     Palpations: Abdomen is soft.     Tenderness: There is no abdominal tenderness.  Musculoskeletal:        General: Tenderness (Diffuse tenderness throughout gentle palpation about the body without any focal point tenderness.) present.  Skin:    Findings: No rash.  Neurological:     Mental Status: He is alert. Mental status is at baseline.  Psychiatric:        Mood and Affect: Mood normal.      ED Treatments / Results  Labs (all labs ordered are listed, but only abnormal results are displayed) Labs Reviewed  BASIC METABOLIC PANEL - Abnormal; Notable for the following components:      Result Value   Potassium 3.1 (*)    Glucose, Bld 150 (*)    Calcium 8.7 (*)    All other components within normal limits  CBC  I-STAT TROPONIN, ED  I-STAT TROPONIN, ED    EKG EKG Interpretation  Date/Time:  Thursday November 04 2018 18:57:13 EST Ventricular Rate:  71 PR Interval:    QRS Duration: 88 QT Interval:  406 QTC Calculation: 442 R Axis:   60 Text Interpretation:  Sinus rhythm Abnormal R-wave progression, early transition Confirmed by Tilden Fossa [phone]) on 11/04/2018 6:59:05 PM   Radiology Dg Chest 2 View  Result Date: 11/04/2018 CLINICAL DATA:  Right chest pain EXAM: CHEST - 2 VIEW COMPARISON:  03/20/2018 FINDINGS: Right Port-A-Cath remains in place, unchanged. Heart is normal size. No confluent airspace opacities or effusions. No acute bony abnormality. IMPRESSION: No active cardiopulmonary disease. Electronically  Signed   By: Charlett Nose M.D.   On: 11/04/2018 20:26    Procedures Procedures (  including critical care time)  Medications Ordered in ED Medications  morphine 4 MG/ML injection 6 mg (6 mg Intravenous Given 11/04/18 2106)  ondansetron (ZOFRAN) injection 4 mg (4 mg Intravenous Given 11/04/18 2106)  sodium chloride 0.9 % bolus 1,000 mL (0 mLs Intravenous Stopped 11/04/18 2137)  potassium chloride SA (K-DUR,KLOR-CON) CR tablet 40 mEq (40 mEq Oral Given 11/04/18 2106)     Initial Impression / Assessment and Plan / ED Course  I have reviewed the triage vital signs and the nursing notes.  Pertinent labs & imaging results that were available during my care of the patient were reviewed by me and considered in my medical decision making (see chart for details).     BP 130/86   Pulse 62   Temp 98.1 F (36.7 C) (Oral)   Resp 18   Ht 5\' 9"  (1.753 m)   Wt 74.8 kg   SpO2 96%   BMI 24.37 kg/m    Final Clinical Impressions(s) / ED Diagnoses   Final diagnoses:  Other chronic pain  Anxiety    ED Discharge Orders         Ordered    traMADol (ULTRAM) 50 MG tablet  Every 6 hours PRN     11/04/18 2330    hydrOXYzine (ATARAX/VISTARIL) 25 MG tablet  Every 6 hours     11/04/18 2330         11:15 PM Patient with history of MS, and chronic pain along with multiple comorbidities presenting complaining of pain similar to prior MS flare.  Along the multiple complaint he also endorsed chest pain.  Pain is atypical for ACS.  He has negative delta troponin and EKG without acute changes.  He has mild hypokalemia with potassium of 3.1, supplementation given.  His chest x-ray is unremarkable.  At this time, I felt patient would be best to follow-up with his primary care provider for further management of his MS.  No vision changes and no appreciable weakness.  11:21 PM Patient is requesting for opiate medication.  He also report that he feels stressed out and would like to talk to a psychiatrist.   He did not voice any SI or HI.  I felt patient can follow-up with a psychiatrist outpatient and I will provide resources for that.  I do not think Percocet is appropriate however I will provide patient with a short course of tramadol as needed for his pain.  Encourage patient to follow-up with PCP for further care. It is documtend that pt has a legal guardian however there are no contact information available.    Fayrene Helper, PA-C 11/04/18 2332    Tilden Fossa, MD 11/06/18 [phone]

## 2018-11-04 NOTE — ED Triage Notes (Signed)
Pt BIB [facility] c/o chest pain and right arm pain that has gotten worse since Monday. Also had nausea and vomiting since Monday. Last emesis episode yesterday. Chest pain is sharp, constant, and 7/10. Given 324 asa and 1 nitro. No change in pain post nitro administration. VSS for EMS.

## 2018-11-05 DIAGNOSIS — G8929 Other chronic pain: Secondary | ICD-10-CM | POA: Diagnosis not present

## 2018-11-05 NOTE — ED Notes (Signed)
Port de-accessed

## 2018-11-27 ENCOUNTER — Encounter (HOSPITAL_COMMUNITY): Payer: Self-pay | Admitting: Emergency Medicine

## 2018-11-27 ENCOUNTER — Emergency Department (HOSPITAL_BASED_OUTPATIENT_CLINIC_OR_DEPARTMENT_OTHER): Payer: 59

## 2018-11-27 ENCOUNTER — Emergency Department (HOSPITAL_COMMUNITY)
Admission: EM | Admit: 2018-11-27 | Discharge: 2018-12-01 | Disposition: A | Discharge status: Home / Self Care | Payer: 59 | Attending: Emergency Medicine | Admitting: Emergency Medicine

## 2018-11-27 ENCOUNTER — Other Ambulatory Visit: Payer: Self-pay

## 2018-11-27 ENCOUNTER — Emergency Department (HOSPITAL_COMMUNITY): Payer: 59

## 2018-11-27 DIAGNOSIS — R443 Hallucinations, unspecified: Secondary | ICD-10-CM

## 2018-11-27 DIAGNOSIS — F329 Major depressive disorder, single episode, unspecified: Secondary | ICD-10-CM

## 2018-11-27 DIAGNOSIS — R45850 Homicidal ideations: Secondary | ICD-10-CM | POA: Insufficient documentation

## 2018-11-27 DIAGNOSIS — Z87891 Personal history of nicotine dependence: Secondary | ICD-10-CM | POA: Insufficient documentation

## 2018-11-27 DIAGNOSIS — F333 Major depressive disorder, recurrent, severe with psychotic symptoms: Secondary | ICD-10-CM | POA: Insufficient documentation

## 2018-11-27 DIAGNOSIS — M79609 Pain in unspecified limb: Secondary | ICD-10-CM

## 2018-11-27 DIAGNOSIS — F32A Depression, unspecified: Secondary | ICD-10-CM

## 2018-11-27 DIAGNOSIS — M7989 Other specified soft tissue disorders: Secondary | ICD-10-CM | POA: Diagnosis not present

## 2018-11-27 DIAGNOSIS — Z008 Encounter for other general examination: Secondary | ICD-10-CM | POA: Insufficient documentation

## 2018-11-27 DIAGNOSIS — I10 Essential (primary) hypertension: Secondary | ICD-10-CM | POA: Diagnosis not present

## 2018-11-27 DIAGNOSIS — L03115 Cellulitis of right lower limb: Secondary | ICD-10-CM | POA: Insufficient documentation

## 2018-11-27 DIAGNOSIS — W19XXXA Unspecified fall, initial encounter: Secondary | ICD-10-CM

## 2018-11-27 DIAGNOSIS — M79604 Pain in right leg: Secondary | ICD-10-CM | POA: Diagnosis present

## 2018-11-27 LAB — COMPREHENSIVE METABOLIC PANEL
ALT: 20 U/L (ref 0–44)
AST: 16 U/L (ref 15–41)
Albumin: 3.2 g/dL — ABNORMAL LOW (ref 3.5–5.0)
Alkaline Phosphatase: 91 U/L (ref 38–126)
Anion gap: 9 (ref 5–15)
BILIRUBIN TOTAL: 0.4 mg/dL (ref 0.3–1.2)
BUN: 10 mg/dL (ref 6–20)
CO2: 19 mmol/L — ABNORMAL LOW (ref 22–32)
Calcium: 8.4 mg/dL — ABNORMAL LOW (ref 8.9–10.3)
Chloride: 111 mmol/L (ref 98–111)
Creatinine, Ser: 0.83 mg/dL (ref 0.61–1.24)
GFR calc Af Amer: >60 mL/min (ref >60)
GFR calc non Af Amer: >60 mL/min (ref >60)
Glucose, Bld: 98 mg/dL (ref 70–99)
Potassium: 3.6 mmol/L (ref 3.5–5.1)
Sodium: 139 mmol/L (ref 135–145)
Total Protein: 6.1 g/dL — ABNORMAL LOW (ref 6.5–8.1)

## 2018-11-27 LAB — CBC
HCT: 42.2 % (ref 39.0–52.0)
Hemoglobin: 13.1 g/dL (ref 13.0–17.0)
MCH: 27.1 pg (ref 26.0–34.0)
MCHC: 31 g/dL (ref 30.0–36.0)
MCV: 87.4 fL (ref 80.0–100.0)
Platelets: 180 10*3/uL (ref 150–400)
RBC: 4.83 MIL/uL (ref 4.22–5.81)
RDW: 16.6 % — ABNORMAL HIGH (ref 11.5–15.5)
WBC: 8.8 10*3/uL (ref 4.0–10.5)
nRBC: 0 % (ref 0.0–0.2)

## 2018-11-27 LAB — ACETAMINOPHEN LEVEL: Acetaminophen (Tylenol), Serum: <10 ug/mL — ABNORMAL LOW (ref 10–30)

## 2018-11-27 LAB — ETHANOL: Alcohol, Ethyl (B): <10 mg/dL (ref <10)

## 2018-11-27 LAB — SALICYLATE LEVEL: Salicylate Lvl: <7 mg/dL (ref 2.8–30.0)

## 2018-11-27 MED ORDER — SULFAMETHOXAZOLE-TRIMETHOPRIM 800-160 MG PO TABS
1.0000 | ORAL_TABLET | Freq: Two times a day (BID) | ORAL | Status: DC
  Administered 2018-11-28 – 2018-12-01 (×7): 1 via ORAL
  Filled 2018-11-27 (×7): qty 1

## 2018-11-27 MED ORDER — OXYCODONE HCL 5 MG PO TABS
5.0000 mg | ORAL_TABLET | Freq: Once | ORAL | Status: AC
  Administered 2018-11-27: 5 mg via ORAL
  Filled 2018-11-27: qty 1

## 2018-11-27 MED ORDER — CEPHALEXIN 250 MG PO CAPS
500.0000 mg | ORAL_CAPSULE | Freq: Three times a day (TID) | ORAL | Status: AC
  Administered 2018-11-28 – 2018-11-30 (×7): 500 mg via ORAL
  Filled 2018-11-27 (×7): qty 2

## 2018-11-27 MED ORDER — SULFAMETHOXAZOLE-TRIMETHOPRIM 800-160 MG PO TABS
1.0000 | ORAL_TABLET | Freq: Once | ORAL | Status: AC
  Administered 2018-11-27: 1 via ORAL
  Filled 2018-11-27: qty 1

## 2018-11-27 MED ORDER — CEPHALEXIN 250 MG PO CAPS
500.0000 mg | ORAL_CAPSULE | Freq: Once | ORAL | Status: AC
  Administered 2018-11-27: 500 mg via ORAL
  Filled 2018-11-27: qty 2
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
## 2018-11-27 NOTE — ED Notes (Signed)
Currently using TTS

## 2018-11-27 NOTE — ED Notes (Addendum)
Patient changed into purple scrubs and patient wanded by security.

## 2018-11-27 NOTE — ED Provider Notes (Signed)
[facility] Department Provider Note MRN:  [id number]  Arrival date & time: 11/27/18     Chief Complaint   Leg Injury and Loss of Consciousness   History of Present Illness   Gary DaviesJonas Seidenberg is a 58 y.o. year-old male with a history of depression, suicide attempt, multiple sclerosis, DVT presenting to the ED with chief complaint of fall.  Patient fell out of his wheelchair today, striking his face against the floor, unsure of loss of consciousness.  Also endorsing moderate neck pain located in the midline.  Denies back pain.  Patient's legs got caught in the wheelchair and had to be removed by EMS, EMS had to disassemble the wheelchair in order to remove the patient's legs from the wheelchair.  Patient endorsing moderate to severe pain to his right knee, right lower leg.  Patient explains that the right leg has been red and swollen for several days, preceding the fall.  Patient also requesting to speak with someone about his depression and anxiety.  Explains that he has been having thoughts of wanting to hurt other people, some members of his family members that are "messing with me".  Denies physical abuse.  Denies SI, no drug or alcohol use, no intentional self-harm recently.  Review of Systems  A complete 10 system review of systems was obtained and all systems are negative except as noted in the HPI and PMH.   Patient's Health History    Past Medical History:  Diagnosis Date  . Abdominal pain, unspecified site   . Anxiety   . Arthritis   . Benign paroxysmal positional vertigo   . Cellulitis and abscess of right leg 04/2017  . Chronic back pain   . Chronic pain syndrome 01/25/2008  . Cluster headache   . Depression    takes Zoloft daily  . DVT (deep venous thrombosis) (HCC)    in the left arm '09  . Gait abnormality    "uses mobile wheelchair, but is ambulatory"  . Gallstones 02/17/2009   resolved after gallbladder surgery.  [name]. GERD  (gastroesophageal reflux disease)    takes Omeprazole as needed  . Headache(784.0)    cluster headaches frequently-takes Topamax daily  . History of colonoscopy   . HTN (hypertension)    takes Lisinopril,Verapamil,and Triamterene HCTZ daily  . Insomnia 11/06/2008  . Joint pain   . Joint swelling    03-07-16 "swelling of right wrist" "after a fall-xray done 03-06-16 "no fractures".  . Memory loss    no an issue at present 03-07-16  . Multiple sclerosis (HCC)    Dx. 2005 - Dr. Tinnie GensJeffrey follows LOV 4'17 tx. Tysabri monthly IV-[facility] [place].  [name]. Nonspecific elevation of levels of transaminase or lactic acid dehydrogenase (LDH)   . Other specified visual disturbances   . Other syndromes affecting cervical region   . Pneumonia 2009  . Trigeminal neuralgia     history" Multiple sclerosis"    Past Surgical History:  Procedure Laterality Date  . CHOLECYSTECTOMY  02/20/2009  . COLONOSCOPY WITH PROPOFOL N/A 03/17/2016   Procedure: COLONOSCOPY WITH PROPOFOL;  Surgeon: Carman ChingJames Edwards, MD;  Location: [facility];  Service: Endoscopy;  Laterality: N/A;  . PORT A CATH REVISION N/A 07/06/2015   Procedure: Removal and replacement of PORT A CATH;  Surgeon: Claud KelpHaywood Ingram, MD;  [facility];  Service: General;  Laterality: N/A;  . PORTACATH PLACEMENT N/A 03/27/2014   Procedure: INSERTION PORT-A-CATH;  Surgeon: Ernestene MentionHaywood M Ingram, MD;  [facility];  Service: General;  Laterality: N/A;    Family History  Problem Relation Age of Onset  . Cancer Father   . Diabetes Mother     Social History   Socioeconomic History  . Marital status: Divorced    Spouse name: Not on file  . Number of children: 3  . Years of education: 5311 th  . Highest education level: Not on file  Occupational History    Comment: Disabled  Social Needs  . Financial resource strain: Not on file  . Food insecurity:    Worry: Not on file    Inability: Not on file  . Transportation needs:    Medical: Not on  file    Non-medical: Not on file  Tobacco Use  . Smoking status: Former Smoker    Packs/day: 1.00    Years: 38.00    Pack years: 38.00    Types: Cigarettes  . Smokeless tobacco: Never Used  . Tobacco comment: cutting back  Substance and Sexual Activity  . Alcohol use: Yes    Alcohol/week: 0.0 standard drinks    Comment: occasional  . Drug use: No    Comment: Quit 2011  . Sexual activity: Not on file  Lifestyle  . Physical activity:    Days per week: Not on file    Minutes per session: Not on file  . Stress: Not on file  Relationships  . Social connections:    Talks on phone: Not on file    Gets together: Not on file    Attends religious service: Not on file    Active member of club or organization: Not on file    Attends meetings of clubs or organizations: Not on file    Relationship status: Not on file  . Intimate partner violence:    Fear of current or ex partner: Not on file    Emotionally abused: Not on file    Physically abused: Not on file    Forced sexual activity: Not on file  Other Topics Concern  . Not on file  Social History Narrative   Patient is divorced. Patient is disabled. Because of his MS. Patient has 11 th grade education. 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    Right handed.   Caffeine- One daily.     Physical Exam  Vital Signs and Nursing Notes reviewed Vitals:   11/27/18 2200 11/27/18 2215  BP: (!) 142/90 129/86  Pulse: (!) 101 93  Resp: (!) 22 20  Temp:    SpO2: 100% 98%    CONSTITUTIONAL: Chronically ill-appearing, NAD NEURO:  Alert and oriented x 3, decreased mobility of lower extremities EYES:  eyes equal and reactive ENT/NECK:  no LAD, no JVD CARDIO: Regular rate, well-perfused, normal S1 and S2 PULM:  CTAB no wheezing or rhonchi GI/GU:  normal bowel sounds, non-distended, non-tender MSK/SPINE:  No gross deformities, erythema and edema to the right lower extremity involving the foot, tib-fib, up to  the knee SKIN:  no rash, atraumatic PSYCH:  Appropriate speech and behavior  Diagnostic and Interventional Summary    EKG Interpretation  Date/Time:  Saturday November 27 2018 17:47:32 EST Ventricular Rate:  72 PR Interval:    QRS Duration: 86 QT Interval:  395 QTC Calculation: 433 R Axis:   41 Text Interpretation:  Sinus rhythm Consider left ventricular hypertrophy Confirmed by Kennis CarinaBero, Rondell Frick [phone] on 11/27/2018 6:16:21 PM      Labs Reviewed  CBC - Abnormal;  Notable for the following components:      Result Value   RDW 16.6 (*)    All other components within normal limits  COMPREHENSIVE METABOLIC PANEL - Abnormal; Notable for the following components:   CO2 19 (*)    Calcium 8.4 (*)    Total Protein 6.1 (*)    Albumin 3.2 (*)    All other components within normal limits  ACETAMINOPHEN LEVEL - Abnormal; Notable for the following components:   Acetaminophen (Tylenol), Serum <10 (*)    All other components within normal limits  SALICYLATE LEVEL  ETHANOL  RAPID URINE DRUG SCREEN, HOSP PERFORMED    VAS [place] LOWER EXTREMITY VENOUS (DVT) (ONLY [facility])      DG Chest 2 View  Final Result    DG Knee Complete 4 Views Right  Final Result    DG Tibia/Fibula Right  Final Result    DG Ankle Complete Right  Final Result    CT HEAD WO CONTRAST  Final Result    CT CERVICAL SPINE WO CONTRAST  Final Result      Medications  cephALEXin (KEFLEX) capsule 500 mg (has no administration in time range)  sulfamethoxazole-trimethoprim (BACTRIM DS,SEPTRA DS) 800-160 MG per tablet 1 tablet (has no administration in time range)  oxyCODONE (Oxy IR/ROXICODONE) immediate release tablet 5 mg (5 mg Oral Given 11/27/18 1937)  sulfamethoxazole-trimethoprim (BACTRIM DS,SEPTRA DS) 800-160 MG per tablet 1 tablet (1 tablet Oral Given 11/27/18 2103)  cephALEXin (KEFLEX) capsule 500 mg (500 mg Oral Given 11/27/18 2103)     Procedures Critical Care  ED Course and Medical Decision Making  I have  reviewed the triage vital signs and the nursing notes.  Pertinent labs & imaging results that were available during my care of the patient were reviewed by me and considered in my medical decision making (see below for details).  Fall today but preceded by right lower extremity swelling and redness, history of DVT, claims full compliance with Xarelto at home.  Head trauma, on Xarelto, neck pain, CT is pending.  After medically cleared, will consider consulting TTS given his history of suicide attempt and worsening depression.  Imaging unremarkable, labs unremarkable, suspect possible cellulitis of the right leg given the erythema, normal vital signs, no fever, no indication for medical admission.  Will provide Keflex and Bactrim for 1 week.  TTS evaluated the patient and recommends inpatient management, will transition to purple zone.  Medically cleared.  Signed out to default provider at shift change.  Elmer Sow. Pilar Plate, MD [facility] [email or web address]  Final Clinical Impressions(s) / ED Diagnoses     ICD-10-CM   1. Depression, unspecified depression type F32.9   2. Fall W19.Lorne Skeens DG Chest 2 View    DG Chest 2 View  3. Hallucinations R44.3   4. Homicidal thoughts R45.850     ED Discharge Orders    None         Sabas Sous, MD 11/27/18 2240

## 2018-11-27 NOTE — Progress Notes (Signed)
VASCULAR LAB PRELIMINARY  PRELIMINARY  PRELIMINARY  PRELIMINARY  Right lower extremity venous duplex completed.    Preliminary report:  See results in CV Proc  Gave results to Dr. Adolm Joseph, [facility], RVT 11/27/2018, 7:41 PM
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
## 2018-11-28 DIAGNOSIS — L03115 Cellulitis of right lower limb: Secondary | ICD-10-CM | POA: Diagnosis not present

## 2018-11-28 LAB — RAPID URINE DRUG SCREEN, HOSP PERFORMED
Amphetamines: NOT DETECTED
Barbiturates: NOT DETECTED
Benzodiazepines: NOT DETECTED
Cocaine: NOT DETECTED
Opiates: POSITIVE — AB
TETRAHYDROCANNABINOL: NOT DETECTED

## 2018-11-28 MED ORDER — VERAPAMIL HCL 80 MG PO TABS
80.0000 mg | ORAL_TABLET | Freq: Two times a day (BID) | ORAL | Status: DC
  Administered 2018-11-28 – 2018-11-30 (×4): 80 mg via ORAL
  Filled 2018-11-28 (×7): qty 1

## 2018-11-28 MED ORDER — GABAPENTIN 300 MG PO CAPS
600.0000 mg | ORAL_CAPSULE | Freq: Three times a day (TID) | ORAL | Status: DC
  Administered 2018-11-28 – 2018-12-01 (×13): 600 mg via ORAL
  Filled 2018-11-28 (×12): qty 2

## 2018-11-28 MED ORDER — RISPERIDONE 0.5 MG PO TABS
0.2500 mg | ORAL_TABLET | Freq: Every day | ORAL | Status: DC
  Administered 2018-11-28 – 2018-11-30 (×3): 0.25 mg via ORAL
  Filled 2018-11-28 (×3): qty 1

## 2018-11-28 MED ORDER — TOPIRAMATE 25 MG PO TABS
150.0000 mg | ORAL_TABLET | Freq: Every day | ORAL | Status: DC
  Administered 2018-11-29 – 2018-11-30 (×2): 150 mg via ORAL
  Filled 2018-11-28 (×2): qty 6

## 2018-11-28 MED ORDER — BACLOFEN 5 MG HALF TABLET: 20.0000 mg | ORAL_TABLET | Freq: Four times a day (QID) | ORAL | Status: DC | PRN

## 2018-11-28 MED ORDER — TOPIRAMATE 25 MG PO TABS
75.0000 mg | ORAL_TABLET | ORAL | Status: DC
  Administered 2018-11-28 – 2018-12-01 (×4): 75 mg via ORAL
  Filled 2018-11-28 (×4): qty 3

## 2018-11-28 MED ORDER — HYDROCHLOROTHIAZIDE 25 MG PO TABS
25.0000 mg | ORAL_TABLET | Freq: Every day | ORAL | Status: DC
  Administered 2018-11-29 – 2018-12-01 (×3): 25 mg via ORAL
  Filled 2018-11-28 (×4): qty 1

## 2018-11-28 MED ORDER — ACETAMINOPHEN 325 MG PO TABS
650.0000 mg | ORAL_TABLET | Freq: Four times a day (QID) | ORAL | Status: DC | PRN
  Administered 2018-11-28 – 2018-11-30 (×3): 650 mg via ORAL
  Filled 2018-11-28 (×3): qty 2

## 2018-11-28 MED ORDER — RIVAROXABAN 10 MG PO TABS
10.0000 mg | ORAL_TABLET | Freq: Every day | ORAL | Status: DC
  Administered 2018-11-28 – 2018-11-30 (×3): 10 mg via ORAL
  Filled 2018-11-28 (×4): qty 1

## 2018-11-28 MED ORDER — TRAZODONE HCL 100 MG PO TABS
100.0000 mg | ORAL_TABLET | Freq: Every day | ORAL | Status: DC
  Administered 2018-11-28 – 2018-11-30 (×3): 100 mg via ORAL
  Filled 2018-11-28: qty 1
  Filled 2018-11-28: qty 2
  Filled 2018-11-28: qty 1

## 2018-11-28 MED ORDER — DALFAMPRIDINE ER 10 MG PO TB12: 10.0000 mg | ORAL_TABLET | Freq: Two times a day (BID) | ORAL | Status: DC

## 2018-11-28 MED ORDER — HYDROXYZINE HCL 25 MG PO TABS
25.0000 mg | ORAL_TABLET | Freq: Four times a day (QID) | ORAL | Status: DC
  Administered 2018-11-28 – 2018-12-01 (×11): 25 mg via ORAL
  Filled 2018-11-28 (×10): qty 1

## 2018-11-28 MED ORDER — SERTRALINE HCL 50 MG PO TABS
50.0000 mg | ORAL_TABLET | Freq: Every day | ORAL | Status: DC
  Administered 2018-11-28 – 2018-12-01 (×4): 50 mg via ORAL
  Filled 2018-11-28 (×5): qty 1

## 2018-11-28 MED ORDER — HEPARIN SOD (PORK) LOCK FLUSH 100 UNIT/ML IV SOLN
500.0000 [IU] | INTRAVENOUS | Status: AC | PRN
  Administered 2018-11-28: 500 [IU]

## 2018-11-28 MED ORDER — PANTOPRAZOLE SODIUM 40 MG PO TBEC
40.0000 mg | DELAYED_RELEASE_TABLET | Freq: Every day | ORAL | Status: DC
  Administered 2018-11-28 – 2018-12-01 (×4): 40 mg via ORAL
  Filled 2018-11-28 (×4): qty 1

## 2018-11-28 MED ORDER — OXCARBAZEPINE 300 MG PO TABS
300.0000 mg | ORAL_TABLET | Freq: Three times a day (TID) | ORAL | Status: DC
  Administered 2018-11-28 – 2018-12-01 (×10): 300 mg via ORAL
  Filled 2018-11-28 (×10): qty 1

## 2018-11-28 NOTE — B[facility] Counselor (Addendum)
Re-assessment: Patient initial assessed 11/27/2018 with complaints suicidal / homicidal ideations. he has felt increasingly depressed and anxious, particularly over the past two weeks. He says "I have a lot of stuff on my mind" and "Everyone is blaming me for everything." He reports he has thoughts of harming his brother and nephew and would like to act on these thoughts. Pt says "I'm not going to use the word 'kill' because I know who I'm talking to but I do want to hurt them." Pt says he has a history of physical aggression in the past. He also reports visual hallucinations almost daily for the past two weeks of seeing people who are not there or "ghosts." He denies auditory hallucinations. Pt reports passive suicidal ideation with no plan or intent.   Patient presently denies suicidal / homicidal ideations, report auditory / visual hallucinations. Patient stated, "I see things a little and hear people talking to me. Patient reported yesterday when he felt suicidal/homicidal he was mad and frustrated triggered by not receiving help for doing things.  Reola Calkins, NP, joined the tele-psych. Patient report he's in a wheel chair due to having MS. Report he lives with his mother / brother who does not always assist him when he needs assistance. Report he does not attend rehab related to his MS. Patient denied suicidal / homicidal ideations to NP.   Disposition:  Reola Calkins, NP, discharged patient is psych-cleared and has been recommended to social work for consult

## 2018-11-28 NOTE — ED Notes (Signed)
Belongings inventoried, valuables with security.

## 2018-11-28 NOTE — ED Notes (Signed)
[facility] called. Patient denied placement because he will require 1 person assist to prevent falls.

## 2018-11-28 NOTE — Progress Notes (Signed)
Patient is seen by me via tele-psych I am consulted with Dr. Lucianne Muss.  Patient denies any suicidal or homicidal ideations and denies any hallucinations.  Patient reports that he has MS and he lives at home with his mother and his brother and they are unable to care for him as he needs it.  Patient has been considering a facility such as a SNF or an ALF but has not pursued being accepted at one.  Patient states he does not know what he would do if he did fall out of the wheelchair again, but does state that he does not want to kill himself or to harm anyone else and that yesterday was just very overwhelming when he did not have the help he needed.  Discussed case with Dr. Madilyn Hook and agreed that patient can have a case management consult, social work consult, and a PT consult so the patient can be evaluated for SNF placement from the ED.  At this time patient does not meet inpatient criteria and is psychiatrically cleared.

## 2018-11-28 NOTE — ED Notes (Signed)
Pt given shortbread cookies and sprite.

## 2018-11-28 NOTE — Progress Notes (Signed)
CSW received consult for facility placement. CSW will follow patient pending PT evaluation for SNF placement is patient is appropriate.  Tenna Delaine, LCSW, LCAS-A Clinical Social Worker II [phone]

## 2018-11-28 NOTE — Progress Notes (Signed)
Received ED CM consult concerning patient being cleared by [facility] and needing [facility] outpatient resources,  ED CSW was consulted.

## 2018-11-28 NOTE — ED Notes (Signed)
Pt placed in H026 for TTS, sitter at bedside.

## 2018-11-28 NOTE — ED Notes (Signed)
Breakfast tray at bedside, food heated up for patient per his request

## 2018-11-28 NOTE — ED Notes (Signed)
Pt placed in hospital bed for comfort.

## 2018-11-28 NOTE — ED Notes (Signed)
Breakfast trays ordered  

## 2018-11-28 NOTE — ED Notes (Signed)
Sitter at bedside.

## 2018-11-28 NOTE — ED Notes (Signed)
Per pharmacy, we do not carry dalfampridine. RN attempted to call pt mother to bring medicine. Mother did not answer. Will attempt later.

## 2018-11-28 NOTE — ED Notes (Signed)
Dr yao at bedside. 

## 2018-11-28 NOTE — ED Notes (Signed)
Meal tray ordered 

## 2018-11-28 NOTE — ED Notes (Signed)
RN made contact with mother who is aware she or family/friend will need to bring in pt's Dalfampridine TB12. Mother states she is unable at the moment, but is aware pt is in need of it and that we do not carry it.

## 2018-11-28 NOTE — Progress Notes (Signed)
Patient meets criteria for inpatient treatment. No appropriate or available beds at [facility]. CSW faxed referrals to the following facilities for review:  [address], [place], [address], [address], [place], [place], [address], [place], [place], [place], [place], [address], [address], [address], [place], [place], [place], [place], [employer] [facility], [facility], [facility], [employer], [place], [place], [place]), [place], [place], [place], and [facility].  TTS will continue to seek bed placement.  Vilma Meckel. Algis Greenhouse, MSW, LCSW Clinical Social Work/Disposition Phone: [phone] Fax: [phone]

## 2018-11-28 NOTE — ED Notes (Signed)
Dr. Madilyn Hook made aware of need for home meds to be ordered and temp of 100.3

## 2018-11-29 ENCOUNTER — Ambulatory Visit: Payer: 59 | Admitting: Psychology

## 2018-11-29 DIAGNOSIS — L03115 Cellulitis of right lower limb: Secondary | ICD-10-CM | POA: Diagnosis not present

## 2018-11-29 LAB — CBC WITH DIFFERENTIAL/PLATELET
Abs Immature Granulocytes: 0.06 10*3/uL (ref 0.00–0.07)
BASOS ABS: 0 10*3/uL (ref 0.0–0.1)
Basophils Relative: 1 %
Eosinophils Absolute: 0.1 10*3/uL (ref 0.0–0.5)
Eosinophils Relative: 3 %
HCT: 43.3 % (ref 39.0–52.0)
Hemoglobin: 13.3 g/dL (ref 13.0–17.0)
Immature Granulocytes: 1 %
Lymphocytes Relative: 24 %
Lymphs Abs: 1 10*3/uL (ref 0.7–4.0)
MCH: 26.9 pg (ref 26.0–34.0)
MCHC: 30.7 g/dL (ref 30.0–36.0)
MCV: 87.5 fL (ref 80.0–100.0)
Monocytes Absolute: 0.6 10*3/uL (ref 0.1–1.0)
Monocytes Relative: 15 %
NEUTROS ABS: 2.4 10*3/uL (ref 1.7–7.7)
Neutrophils Relative %: 56 %
Platelets: 190 10*3/uL (ref 150–400)
RBC: 4.95 MIL/uL (ref 4.22–5.81)
RDW: 16.4 % — ABNORMAL HIGH (ref 11.5–15.5)
WBC: 4.2 10*3/uL (ref 4.0–10.5)
nRBC: 0 % (ref 0.0–0.2)

## 2018-11-29 MED ORDER — OXYCODONE-ACETAMINOPHEN 10-325 MG PO TABS: 1.0000 | ORAL_TABLET | Freq: Three times a day (TID) | ORAL | Status: DC | PRN

## 2018-11-29 MED ORDER — OXYCODONE HCL 5 MG PO TABS
5.0000 mg | ORAL_TABLET | Freq: Three times a day (TID) | ORAL | Status: DC | PRN
  Administered 2018-11-29 – 2018-12-01 (×2): 5 mg via ORAL
  Filled 2018-11-29 (×2): qty 1

## 2018-11-29 MED ORDER — OXYCODONE-ACETAMINOPHEN 5-325 MG PO TABS
1.0000 | ORAL_TABLET | Freq: Three times a day (TID) | ORAL | Status: DC | PRN
  Administered 2018-11-29 – 2018-11-30 (×2): 1 via ORAL
  Filled 2018-11-29 (×2): qty 1

## 2018-11-29 MED ORDER — DICYCLOMINE HCL 20 MG PO TABS: 20.0000 mg | ORAL_TABLET | Freq: Four times a day (QID) | ORAL | Status: DC | PRN

## 2018-11-29 NOTE — Progress Notes (Addendum)
3:12pm- CSW reach out to pt's mother listed in chart with permission to do so. CSW spoke with mother about other supports for pt in the home. According to pt, pt has two older brother 75 adm 45. CSW was asked to call "Boosie" to see if he would be willing to help care for pt at the home until placement is found. Per pt's mother "Boosi" is not able to care for pt as he has a bad back himself. CSW updated EDP of this and was informed that pt will remain in the ED until placement has been found. CSW awaiting PT evalutation note in chary. CSW spoke with Dahlia Client from PT and was informed that she would follow up with Therapist to ensure that note has been placed to further placement.   12:06pm-CSW updated pt with barriers to placement at this time. Pt has no offers at this time as well as PT note hasn't been placed in system at this time.   10:33am-CSW spoke with PT and was informed that pt is in need of SNF placement. CSW explained barriers to placing pt immediatly and expressed that it would take some time due to PASRR issues. CSW has sent information out to facilities on pt but pt's PASRR still needed before a facility can take pt.   8:09am-CSW spoke with MD's to gather update on pt's needs. CSW was advised that pt has been here for two days and suggested that pt may have been running a fever on yesterday. CSW was advised that more test would be ordered to determine further needs of pt. CSW gave update that CSW has submitted for pt's PASARR (number used to determine the appropriate level of care for pt) and it went to review at this time. CSW advised MD's that person who screens these are off due to the holiday which would delay receving number for  placement. CSW informed that per pt he is from home with mom and brother who have been assisting pt as needed while at home. MD sought further details from CSW on if pt would be able to be set uw with home health and then be placed from home since PASRR is pending CSW  advised MD that this is possible to not hold up discharge if pt is ready to discharge. CSW will follow back up with EDP soon.   ED CSW aware that pt is holding for placement. CSW aware that pt is still needing to be evaluated by PT for further needs of SNF placement. CSW to follow up with pt at bedside to give updates at this time.     Claude Manges [place] Festa, MSW, LCSW-A Emergency Department Clinical Social Worker [phone]

## 2018-11-29 NOTE — ED Notes (Signed)
Pt observed to still be resting comfortably and sleeping soundly at this time.

## 2018-11-29 NOTE — [place] FL2 (Addendum)
[place] MEDICAID FL2 LEVEL OF CARE SCREENING TOOL     IDENTIFICATION  Patient Name: Gary DaviesJonas Kham Birthdate: 09/03/1961 Sex: male Admission Date (Current Location): 11/27/2018  [facility] [place]   Facility and Address:  [facility], [address], [place], [place] [address]      Provider Number: [id number]  Attending Physician Name and Address:  Default, Provider, MD  Relative Name and Phone Number:       Current Level of Care: Hospital Recommended Level of Care: Skilled Nursing Facility Prior Approval Number:    Date Approved/Denied:   PASRR Number:    Discharge Plan: SNF    Current Diagnoses: Patient Active Problem List   Diagnosis Date Noted  . Chronic right shoulder pain 09/21/2018  . Bilateral primary osteoarthritis of knee 09/21/2018  . Drug overdose 04/08/2018  . Suicide attempt (HCC)   . Overdose by acetaminophen, accidental or unintentional, initial encounter 03/18/2018  . Intentional acetaminophen overdose (HCC)   . Severe major depression, single episode, with psychotic features (HCC) 10/04/2017  . Weakness   . Cellulitis of right leg 05/07/2017  . Right carpal tunnel syndrome 02/16/2017  . Gait disturbance 01/22/2017  . Insomnia 01/22/2017  . Depression with anxiety 01/22/2017  . High risk medication use 01/22/2017  . Major depressive disorder, recurrent episode (HCC) 12/19/2016  . Suicidal ideation   . AKI (acute kidney injury) (HCC) 11/15/2016  . Chronic hepatitis C virus infection (HCC) 11/15/2016  . Chronic abdominal pain 11/15/2016  . Chronic pain syndrome 11/15/2016  . Acute retention of urine 11/15/2016  . Generalized abdominal pain   . Chronic cluster headache, not intractable   . Community acquired pneumonia   . TB lung, latent   . HCAP (healthcare-associated pneumonia) 02/16/2016  . Hypokalemia 02/16/2016  . Intractable cluster headache syndrome   . DVT (deep venous thrombosis) (HCC)    . Depression   . GERD (gastroesophageal reflux disease)   . HTN (hypertension)   . Essential hypertension   . Gastroesophageal reflux disease without esophagitis   . CAP (community acquired pneumonia)   . Multiple sclerosis (HCC) 08/02/2013  . ABDOMINAL BLOATING 10/14/2010  . LOOSE STOOLS 10/14/2010  . PULMONARY EMBOLISM, HX OF 10/14/2010    Orientation RESPIRATION BLADDER Height & Weight     Self, Time, Situation, Place  Normal Continent Weight: 165 lb 5.5 oz (75 kg) Height:  5\' 9"  (175.3 cm)  BEHAVIORAL SYMPTOMS/MOOD NEUROLOGICAL BOWEL NUTRITION STATUS      Continent Diet(please see discharge summary. )  AMBULATORY STATUS COMMUNICATION OF NEEDS Skin   Extensive Assist Verbally Normal                       Personal Care Assistance Level of Assistance  Bathing, Feeding, Dressing Bathing Assistance: Maximum assistance Feeding assistance: Limited assistance Dressing Assistance: Maximum assistance     Functional Limitations Info  Sight, Hearing, Speech Sight Info: Adequate Hearing Info: Adequate Speech Info: Adequate    SPECIAL CARE FACTORS FREQUENCY  PT (By licensed PT), OT (By licensed OT)     PT Frequency: 5 times a week  OT Frequency: 5 times a week             Contractures Contractures Info: Not present    Additional Factors Info  Code Status, Allergies Code Status Info: FULL Allergies Info: Acyclovir And Related           Current Medications (11/29/2018):  This is the current  hospital active medication list Current Facility-Administered Medications  Medication Dose Route Frequency Provider Last Rate Last Dose  . acetaminophen (TYLENOL) tablet 650 mg  650 mg Oral Q6H PRN Tilden Fossa, MD   650 mg at 11/28/18 2039  . baclofen (LIORESAL) tablet 20 mg  20 mg Oral QID PRN Tilden Fossa, MD      . cephALEXin [facility]) capsule 500 mg  500 mg Oral Q8H Sabas Sous, MD   500 mg at 11/29/18 1610  . dalfampridine TB12 10 mg  10 mg Oral BID Tilden Fossa, MD      . gabapentin (NEURONTIN) capsule 600 mg  600 mg Oral TID AC & HS Tilden Fossa, MD   600 mg at 11/28/18 2210  . hydrochlorothiazide (HYDRODIURIL) tablet 25 mg  25 mg Oral Daily Tilden Fossa, MD   Stopped at 11/28/18 1018  . hydrOXYzine (ATARAX/VISTARIL) tablet 25 mg  25 mg Oral Q6H Tilden Fossa, MD   25 mg at 11/29/18 9604  . Oxcarbazepine (TRILEPTAL) tablet 300 mg  300 mg Oral TID Tilden Fossa, MD   300 mg at 11/28/18 2210  . pantoprazole (PROTONIX) EC tablet 40 mg  40 mg Oral Daily Tilden Fossa, MD   40 mg at 11/28/18 1129  . risperiDONE (RISPERDAL) tablet 0.25 mg  0.25 mg Oral QHS Tilden Fossa, MD   0.25 mg at 11/28/18 2210  . rivaroxaban (XARELTO) tablet 10 mg  10 mg Oral QAC supper Tilden Fossa, MD   10 mg at 11/28/18 2039  . sertraline (ZOLOFT) tablet 50 mg  50 mg Oral Daily Tilden Fossa, MD   50 mg at 11/28/18 1409  . sulfamethoxazole-trimethoprim (BACTRIM DS,SEPTRA DS) 800-160 MG per tablet 1 tablet  1 tablet Oral Q12H Sabas Sous, MD   1 tablet at 11/28/18 2210  . topiramate (TOPAMAX) tablet 150 mg  150 mg Oral QHS Tilden Fossa, MD      . topiramate (TOPAMAX) tablet 75 mg  75 mg Oral Weston Anna, MD   75 mg at 11/29/18 0701  . traZODone (DESYREL) tablet 100 mg  100 mg Oral QHS Tilden Fossa, MD   100 mg at 11/28/18 2210  . verapamil (CALAN) tablet 80 mg  80 mg Oral BID Tilden Fossa, MD   80 mg at 11/28/18 2210   Current Outpatient Medications  Medication Sig Dispense Refill  . AMPYRA 10 MG TB12 Take 10 mg by mouth 2 (two) times daily.    . baclofen (LIORESAL) 20 MG tablet Take 1 tablet (20 mg total) by mouth 4 (four) times daily. For muscle spasms (Patient taking differently: Take 20 mg by mouth 4 (four) times daily as needed for muscle spasms. ) 30 each 0  . clotrimazole-betamethasone (LOTRISONE) cream Apply 1 application topically 2 (two) times daily as needed (for irritation- AS DIRECTED).   2  . dicyclomine (BENTYL) 20  MG tablet Take 20 mg by mouth every 6 (six) hours as needed for spasms.   1  . doxycycline (VIBRAMYCIN) 100 MG capsule Take 100 mg by mouth 2 (two) times daily.  5  . fluocinonide cream (LIDEX) 0.05 % Apply 1 application topically 2 (two) times daily as needed (to affected areas, as directed- avoid face, groin, and axillae).   3  . gabapentin (NEURONTIN) 600 MG tablet Take 600 mg by mouth 4 (four) times daily.   2  . hydrochlorothiazide (HYDRODIURIL) 25 MG tablet Take 25 mg by mouth daily.    [name] omeprazole (PRILOSEC) 40 MG capsule  Take 1 capsule (40 mg total) by mouth every evening. For acid reflux (Patient taking differently: Take 40 mg by mouth daily. For acid reflux) 10 capsule 0  . Oxcarbazepine (TRILEPTAL) 300 MG tablet Take 1 tablet (300 mg total) by mouth 3 (three) times daily. For mood stabilization 90 tablet 0  . oxyCODONE-acetaminophen (PERCOCET) 10-325 MG tablet Take 1 tablet by mouth every 8 (eight) hours as needed for pain.  0  . permethrin (ELIMITE) 5 % cream Apply 1 application topically daily as needed (skin care).     . risperiDONE (RISPERDAL) 0.25 MG tablet Take 1 tablet (0.25 mg total) by mouth at bedtime. 20 tablet 0  . riTUXimab (RITUXAN IV) Inject 1 Applicatorful into the vein every 4 (four) months.     . sertraline (ZOLOFT) 50 MG tablet Take 1 tablet (50 mg total) by mouth daily. For depression 30 tablet 0  . SUMAtriptan (IMITREX) 100 MG tablet Take 100 mg by mouth See admin instructions. Take 100 mg by mouth at onset of headache, may repeat in 2 hours, if needed- not to exceed 2 tablets/24 hrs  5  . topiramate (TOPAMAX) 100 MG tablet Take 150 mg by mouth at bedtime.   1  . topiramate (TOPAMAX) 25 MG tablet Take 75 mg by mouth every morning.     . traMADol (ULTRAM) 50 MG tablet Take 1 tablet (50 mg total) by mouth every 6 (six) hours as needed. (Patient taking differently: Take 50 mg by mouth every 6 (six) hours as needed for moderate pain. ) 15 tablet 0  . traZODone (DESYREL)  100 MG tablet Take 100 mg by mouth at bedtime.     . verapamil (CALAN) 80 MG tablet Take 80 mg by mouth 2 (two) times daily.    Carlena Hurl 10 MG TABS tablet Take 1 tablet (10 mg total) by mouth daily. For blood clot prevention 10 tablet 0  . hydrOXYzine (ATARAX/VISTARIL) 25 MG tablet Take 1 tablet (25 mg total) by mouth every 6 (six) hours. 12 tablet 0   Facility-Administered Medications Ordered in Other Encounters  Medication Dose Route Frequency Provider Last Rate Last Dose  . sodium chloride 0.9 % injection 10 mL  10 mL Intracatheter PRN Loann Quill, NP   10 mL at 11/07/15 1012     Discharge Medications: Please see discharge summary for a list of discharge medications.  Relevant Imaging Results:  Relevant Lab Results:   Additional Information SSN- [id number]. PTt's PASAR is under review at this time.   Robb Matar, LCSWA

## 2018-11-29 NOTE — Progress Notes (Signed)
Pt was assessed bedside in ED and noted difficulty with transfers and bed mob, in contrast to PLOF that pt details from before his arrival.  Has limited help at times and cannot count on assistance for standing transfers.  Recommending SNF for rehab to increase his tolerance and control of mobility including transfers to wc and his gait for short trips, which he reports he was doing until arrival here.  Follow acutely for these goals and to assist transition to SNF.  11/29/18 1500  PT Visit Information  Last PT Received On 11/29/18  Assistance Needed +1 (2 n for gait)  History of Present Illness 58 yo male with onset of weakness and fall at home was sent to ED, not admitted to floor yet pending testing.  Has no new diagnoses on chart, but PMHx:   OA knee, suicide attempt, RLE cellulits, depression, chronic pain, CAP, latent TB, HTN, DVT, low K+, Hep C, AKI, R CTS,   Precautions  Precautions Fall  Precaution Comments pt has tremors upon standing  Restrictions  Weight Bearing Restrictions No  Home Living  Family/patient expects to be discharged to: Other (Comment)  Living Arrangements Parent;Other relatives  Prior Function  Level of Independence Needs assistance  Gait / Transfers Assistance Needed pt reports he uses RW vs w/c depending on how he feels that day. Can transfer to w/c without assist  ADL's / Homemaking Assistance Needed has been receiving help at home per chart for bathing and dressing  Communication  Communication Expressive difficulties  Pain Assessment  Pain Assessment Faces  Faces Pain Scale 0  Cognition  Arousal/Alertness Awake/alert;Lethargic  Behavior During Therapy [facility] for tasks assessed/performed  Overall Cognitive Status No family/caregiver present to determine baseline cognitive functioning  General Comments has trouble getting history out  Upper Extremity Assessment  Upper Extremity Assessment Overall WFL for tasks assessed  Lower Extremity Assessment  Lower  Extremity Assessment Generalized weakness  Cervical / Trunk Assessment  Cervical / Trunk Assessment Normal  Bed Mobility  Overal bed mobility Needs Assistance  Bed Mobility Supine to Sit;Sit to Supine  Supine to sit Min assist  Sit to supine Min assist  General bed mobility comments mainly helping with LE's  Transfers  Overall transfer level Needs assistance  Equipment used 1 person hand held assist  Transfers Sit to/from Stand  Sit to Stand Mod assist  General transfer comment PT attempted HHA and pt used bed rail, no room for RW but is unsteady and sits quickly upon standing  Ambulation/Gait  Ambulation/Gait assistance Min assist;Mod assist  Gait Distance (Feet) 2 Feet  Assistive device 1 person hand held assist  Gait Pattern/deviations Step-to pattern;Decreased dorsiflexion - right;Wide base of support  General Gait Details pt has pre-existing foot drop R leg  Gait velocity reduced  Gait velocity interpretation <1.8 ft/sec, indicate of risk for recurrent falls  Balance  Overall balance assessment Needs assistance;History of Falls  Sitting-balance support Feet supported;Bilateral upper extremity supported  Sitting balance-Leahy Scale Fair  Standing balance support Bilateral upper extremity supported;During functional activity  Standing balance-Leahy Scale Poor  General Comments  General comments (skin integrity, edema, etc.) pt has limited tolerance for standing as well as LE weakness from his R foot drop, unsteady with tone upon standing and sits with unpredictable quickness  Exercises  Exercises Other exercises (LE strength is 4- R hip and knee, ankle 0 and L WFL)  PT - End of Session  Activity Tolerance Other (comment) (LE strength and balance skills)  Patient  left in bed;with call bell/phone within reach;with nursing/sitter in room  Nurse Communication Mobility status  PT Assessment  PT Recommendation/Assessment Patient needs continued PT services  PT Visit Diagnosis  Unsteadiness on feet (R26.81);Muscle weakness (generalized) (M62.81);Difficulty in walking, not elsewhere classified (R26.2)  PT Problem List Decreased strength;Decreased range of motion;Decreased activity tolerance;Decreased balance;Decreased mobility;Decreased coordination;Decreased safety awareness;Decreased knowledge of precautions  Barriers to Discharge Decreased caregiver support  Barriers to Discharge Comments has a caregiver during the day but cannot transfer alone and requiring mod assist to power up  PT Plan  PT Frequency (ACUTE ONLY) Min 2X/week  PT Treatment/Interventions (ACUTE ONLY) DME instruction;Gait training;Functional mobility training;Therapeutic activities;Therapeutic exercise;Balance training;Neuromuscular re-education;Patient/family education  AM-PAC PT "6 Clicks" Mobility Outcome Measure (Version 2)  Help needed turning from your back to your side while in a flat bed without using bedrails? 3  Help needed moving from lying on your back to sitting on the side of a flat bed without using bedrails? 3  Help needed moving to and from a bed to a chair (including a wheelchair)? 2  Help needed standing up from a chair using your arms (e.g., wheelchair or bedside chair)? 2  Help needed to walk in hospital room? 2  Help needed climbing 3-5 steps with a railing?  1  6 Click Score 13  Consider Recommendation of Discharge To: CIR/SNF/LTACH  PT Recommendation  Follow Up Recommendations SNF  PT equipment None recommended by PT  Individuals Consulted  Consulted and Agree with Results and Recommendations Patient  Acute Rehab PT Goals  Patient Stated Goal to be able to get home when ready  PT Goal Formulation With patient  Time For Goal Achievement 12/13/18  Potential to Achieve Goals Good  PT Time Calculation  PT Start Time (ACUTE ONLY) 0949  PT Stop Time (ACUTE ONLY) 1019  PT Time Calculation (min) (ACUTE ONLY) 30 min  PT General Charges  $$ ACUTE PT VISIT 1 Visit  PT  Evaluation  $PT Eval Moderate Complexity 1 Mod  PT Treatments  $Neuromuscular Re-education 8-22 mins  Written Expression  Dominant Hand Right    Samul Dada, PT MS Acute Rehab Dept. Number: [facility] [id number] and [facility] [phone]

## 2018-11-30 DIAGNOSIS — L03115 Cellulitis of right lower limb: Secondary | ICD-10-CM | POA: Diagnosis not present

## 2018-11-30 NOTE — Progress Notes (Addendum)
10:11am-CSW has faxed  [phone]) [phone] over requested information to [facility] at this time. CSW awaits response back from Arnell Asal with [facility] regarding what other information  is needed on pt to obtain a PASRR for placemen.t   CSW still following for further placement needs. CSW attempted to look up pt's PASRR on [facility], however site is down at this time. CSW has sent pt's PT note to facilities to see if they can meet pt's needs. At this time pt still has no offers as well as family continues to suggest that they are unable to support and help pt at home as needs are greater at this time. CSW will continue to follow for further needs.    Claude Manges Seraiah Nowack, MSW, LCSW-A Emergency Department Clinical Social Worker [phone]

## 2018-11-30 NOTE — ED Provider Notes (Signed)
Patient is a 58 year old male resting comfortably in bed without any acute changes over night. CSW is following for further placement.     Leretha Dykes, [place] 11/30/18 0901    Melene Plan, DO 11/30/18 1246

## 2018-12-01 DIAGNOSIS — L03115 Cellulitis of right lower limb: Secondary | ICD-10-CM | POA: Diagnosis not present

## 2018-12-01 MED ORDER — CEPHALEXIN 500 MG PO CAPS: 500.0000 mg | ORAL_CAPSULE | Freq: Four times a day (QID) | ORAL | 0 refills | Status: AC

## 2018-12-01 MED ORDER — SULFAMETHOXAZOLE-TRIMETHOPRIM 800-160 MG PO TABS: 1.0000 | ORAL_TABLET | Freq: Two times a day (BID) | ORAL | 0 refills | Status: AC

## 2018-12-01 NOTE — ED Notes (Signed)
Regular Diet was ordered for Lunch. 

## 2018-12-01 NOTE — ED Provider Notes (Signed)
Patient is asking to leave.  He states he does not want to further remain the emergency department for placement.  While it was discussed that his family is indicating they do not have the capability to take care of him, he feels like he still wants to go home.  Offered for him to stay longer while looking for placement but he still declines.  He is not altered and appears overall stable.  His vital signs are reviewed and are benign.  Most recent lab work reviewed and is benign.  At this point, there is no reason to keep him here against as well and while we offered him to stay, I will discharge him home with outpatient antibiotic for the next 3 days for cellulitis, which at this point appears significantly improved and minimal.  Otherwise follow-up with PCP.  He reports his cousin can come pick him up.   Pricilla Loveless, MD 12/01/18 1135

## 2018-12-01 NOTE — Progress Notes (Addendum)
11:55am-CSW aware that pt as chosen to discharge hoe instead of continue tow th placement. Per MD note, MD has spoken with pt and attempted to encourage pt to stay however pt declined and requested to be sent home.    There are no further CSW needs at this time. Pt to discharge home as requested.   11:24am-CSW notified by RN that pt is requesting to leave. CSW advised that RN would speak with MD to see how to proceed at this time.    10:20am-CSW updated PA on pt at this item. PA to follow up with MD as CSW informed PA that per chart, mostly all SNF facilities have denied pt. CSW to follow as needed.   10:07am-CSW has sent over 30 day STR note to [facility] with [facility]. CSW went to give pt update at bedside. CSW updated pt regarding the status of PASRR and other information needed to finish placement. CSW was advised by pt that pt feels ike pt is wasting away sitting here and would like to go home. CSW advised pt that CSW understanding and would try and expedite PASRR. Pt informed CSW that if pt goes home hen pt's brother is bale to care for him. CSW sought further details on this as when CSW spoke with pt's mother, mother expressed that no one would be able to give pt the care that pt is needing. 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 CSW expressed to pt that CSW would speak with MD to see what other options pt may have.   CSW has been communicating with Arnell Asal through [facility] in order to obtain a PASRR for pt for placement. CSW has been asked to send over further information at this time. CSW has already sent signed MD FL2 for placement, psych notes, as well as MD note (in place of H&P). CSW awaiting response from [facility] to determine what further information is needed on pt in order to obtain PASRR for placement at this time.       [name], MSW, LCSW-A Emergency Department Clinical Social Worker [phone]

## 2018-12-01 NOTE — ED Notes (Signed)
Family in to see pt

## 2018-12-01 NOTE — ED Notes (Signed)
Pt stating he wants to leave.  Will speak with MD.

## 2018-12-01 NOTE — Progress Notes (Signed)
CSW received a call from the [facility] assessor stating pt had been assigned a PASRR # but CSW reviewed chart and sees pt had insisted on discharging earlier today against the advice of the EPD who offered to hold the pt for placement.  CSW will continue to follow for D/C needs.  Dorothe Pea. Dorman Calderwood, LCSW, LCAS, CSI Clinical Social Worker Ph: [phone]

## 2018-12-01 NOTE — ED Notes (Signed)
Breakfast tray ordered 

## 2018-12-01 NOTE — Discharge Instructions (Signed)
You are choosing to leave the hospital prior to recommended discharge to a nursing facility/rehab.  Social workers can continue to assist you on an outpatient basis, though how long this will take is unclear.  As far as your leg redness, continue to take the antibiotics that were prescribed for the next 3 days.  If you develop fever, worsening pain or redness, or any other new/concerning symptoms then return to the ER for evaluation.

## 2018-12-24 ENCOUNTER — Inpatient Hospital Stay: Payer: 59 | Attending: Hematology and Oncology

## 2018-12-24 VITALS — BP 144/83 | HR 57 | Temp 96.8°F | Resp 18

## 2018-12-24 DIAGNOSIS — Z79899 Other long term (current) drug therapy: Secondary | ICD-10-CM | POA: Insufficient documentation

## 2018-12-24 DIAGNOSIS — G35 Multiple sclerosis: Secondary | ICD-10-CM | POA: Insufficient documentation

## 2018-12-24 MED ORDER — ACETAMINOPHEN 325 MG PO TABS
650.0000 mg | ORAL_TABLET | Freq: Once | ORAL | Status: AC
  Administered 2018-12-24: 650 mg via ORAL
  Filled 2018-12-24: qty 2

## 2018-12-24 MED ORDER — SODIUM CHLORIDE 0.9% FLUSH
10.0000 mL | INTRAVENOUS | Status: DC | PRN
  Administered 2018-12-24: 10 mL
  Filled 2018-12-24: qty 10

## 2018-12-24 MED ORDER — SODIUM CHLORIDE 0.9 % IV SOLN: 1000.0000 mg | Freq: Once | INTRAVENOUS | Status: DC

## 2018-12-24 MED ORDER — SODIUM CHLORIDE 0.9 % IV SOLN
1000.0000 mg | Freq: Once | INTRAVENOUS | Status: AC
  Administered 2018-12-24: 1000 mg via INTRAVENOUS
  Filled 2018-12-24: qty 100

## 2018-12-24 MED ORDER — DIPHENHYDRAMINE HCL 50 MG/ML IJ SOLN
50.0000 mg | Freq: Once | INTRAMUSCULAR | Status: AC
  Administered 2018-12-24: 50 mg via INTRAVENOUS
  Filled 2018-12-24: qty 1

## 2018-12-24 MED ORDER — SODIUM CHLORIDE 0.9 % IV SOLN
Freq: Once | INTRAVENOUS | Status: AC
  Administered 2018-12-24: 09:00:00 via INTRAVENOUS
  Filled 2018-12-24: qty 250

## 2018-12-24 MED ORDER — HEPARIN SOD (PORK) LOCK FLUSH 100 UNIT/ML IV SOLN
500.0000 [IU] | Freq: Once | INTRAVENOUS | Status: AC | PRN
  Administered 2018-12-24: 500 [IU]
  Filled 2018-12-24: qty 5

## 2018-12-24 MED ORDER — METHYLPREDNISOLONE SODIUM SUCC 125 MG IJ SOLR
125.0000 mg | Freq: Once | INTRAMUSCULAR | Status: AC
  Administered 2018-12-24: 125 mg via INTRAVENOUS
  Filled 2018-12-24: qty 2

## 2018-12-27 IMAGING — CR DG CHEST 2V
2 series · 2 of 2 positions shown · non-contrast
Comparison: 12/03/2016

CLINICAL DATA: Generalized chest pain, dyspnea and dry cough.

EXAM:
CHEST  2 VIEW

[chest lat]
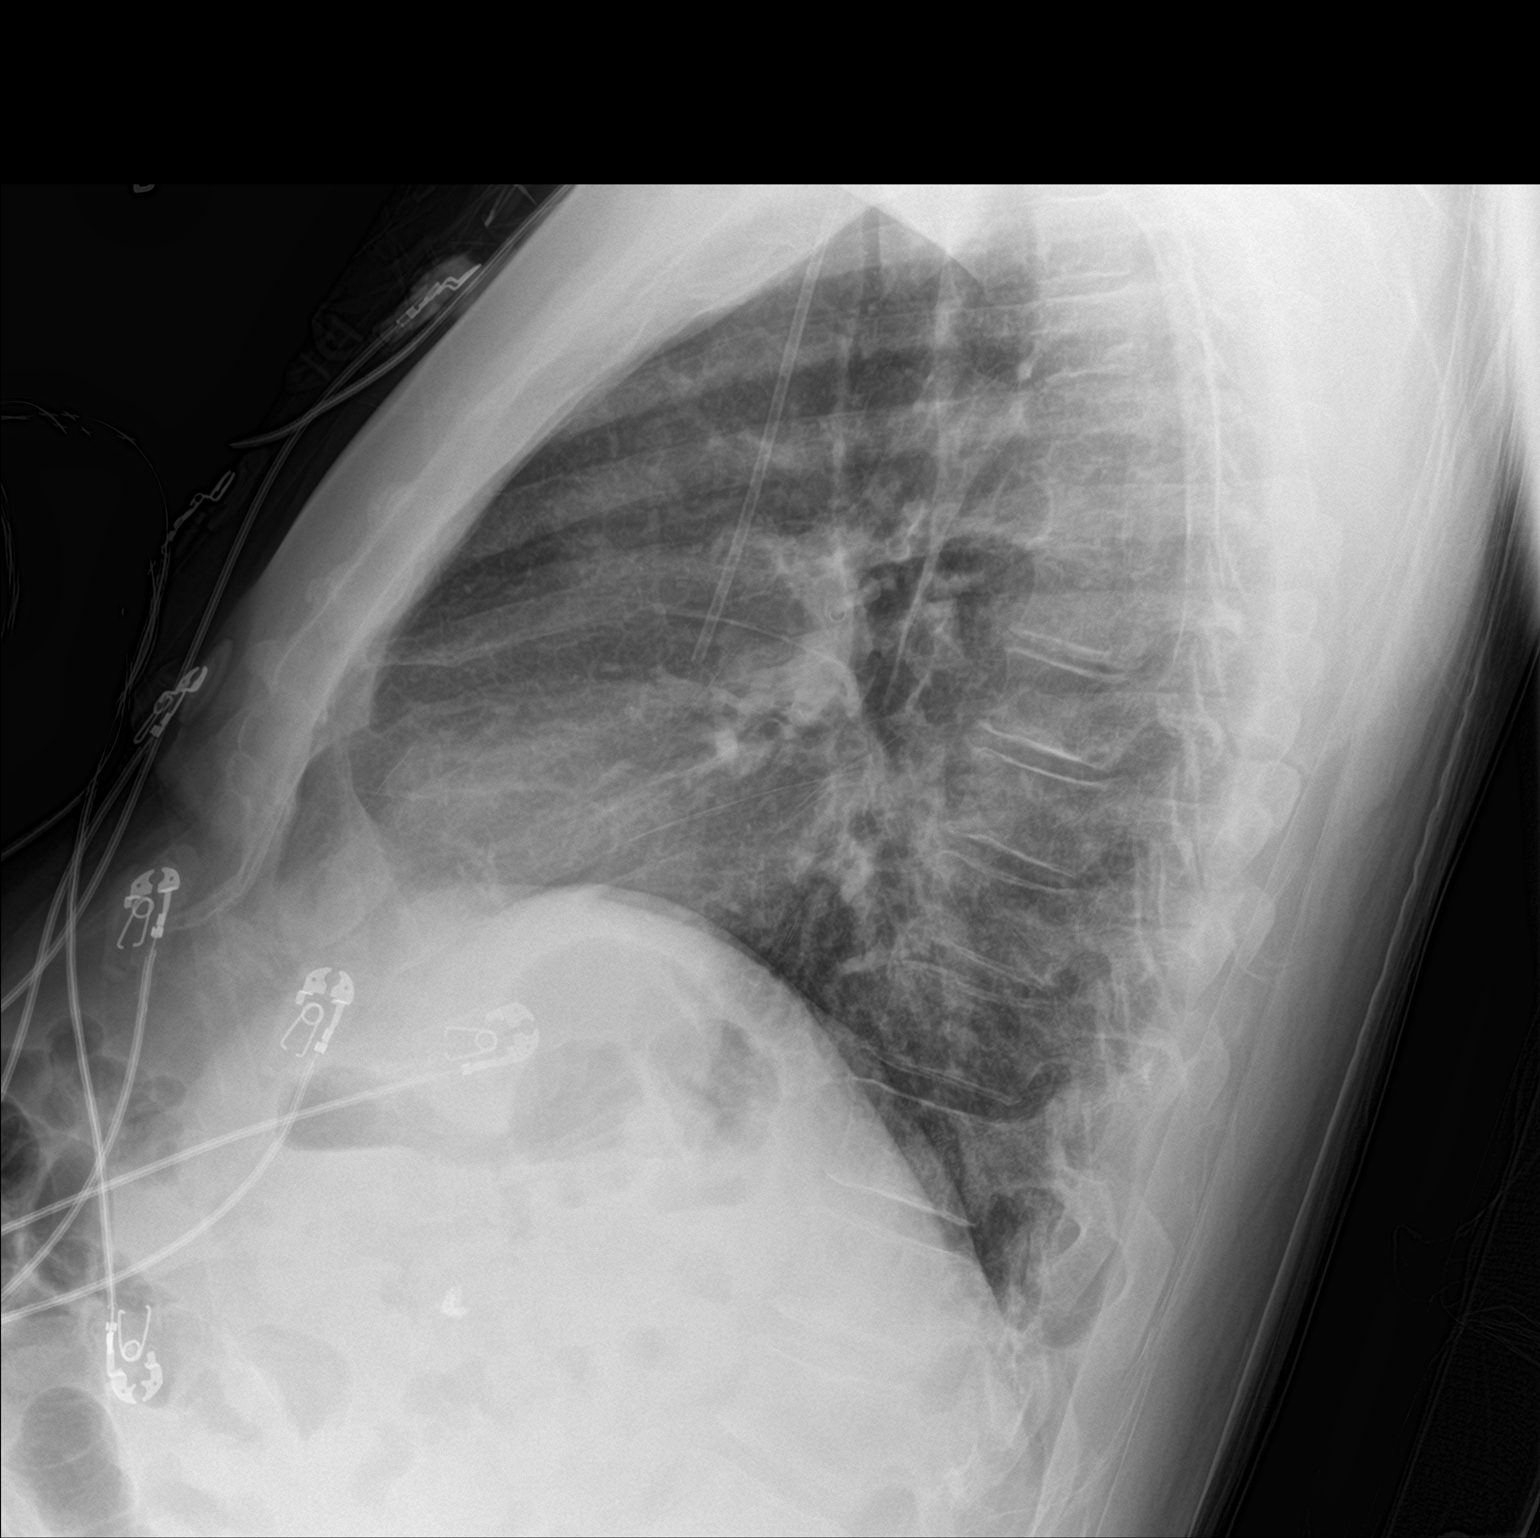

[chest ap]
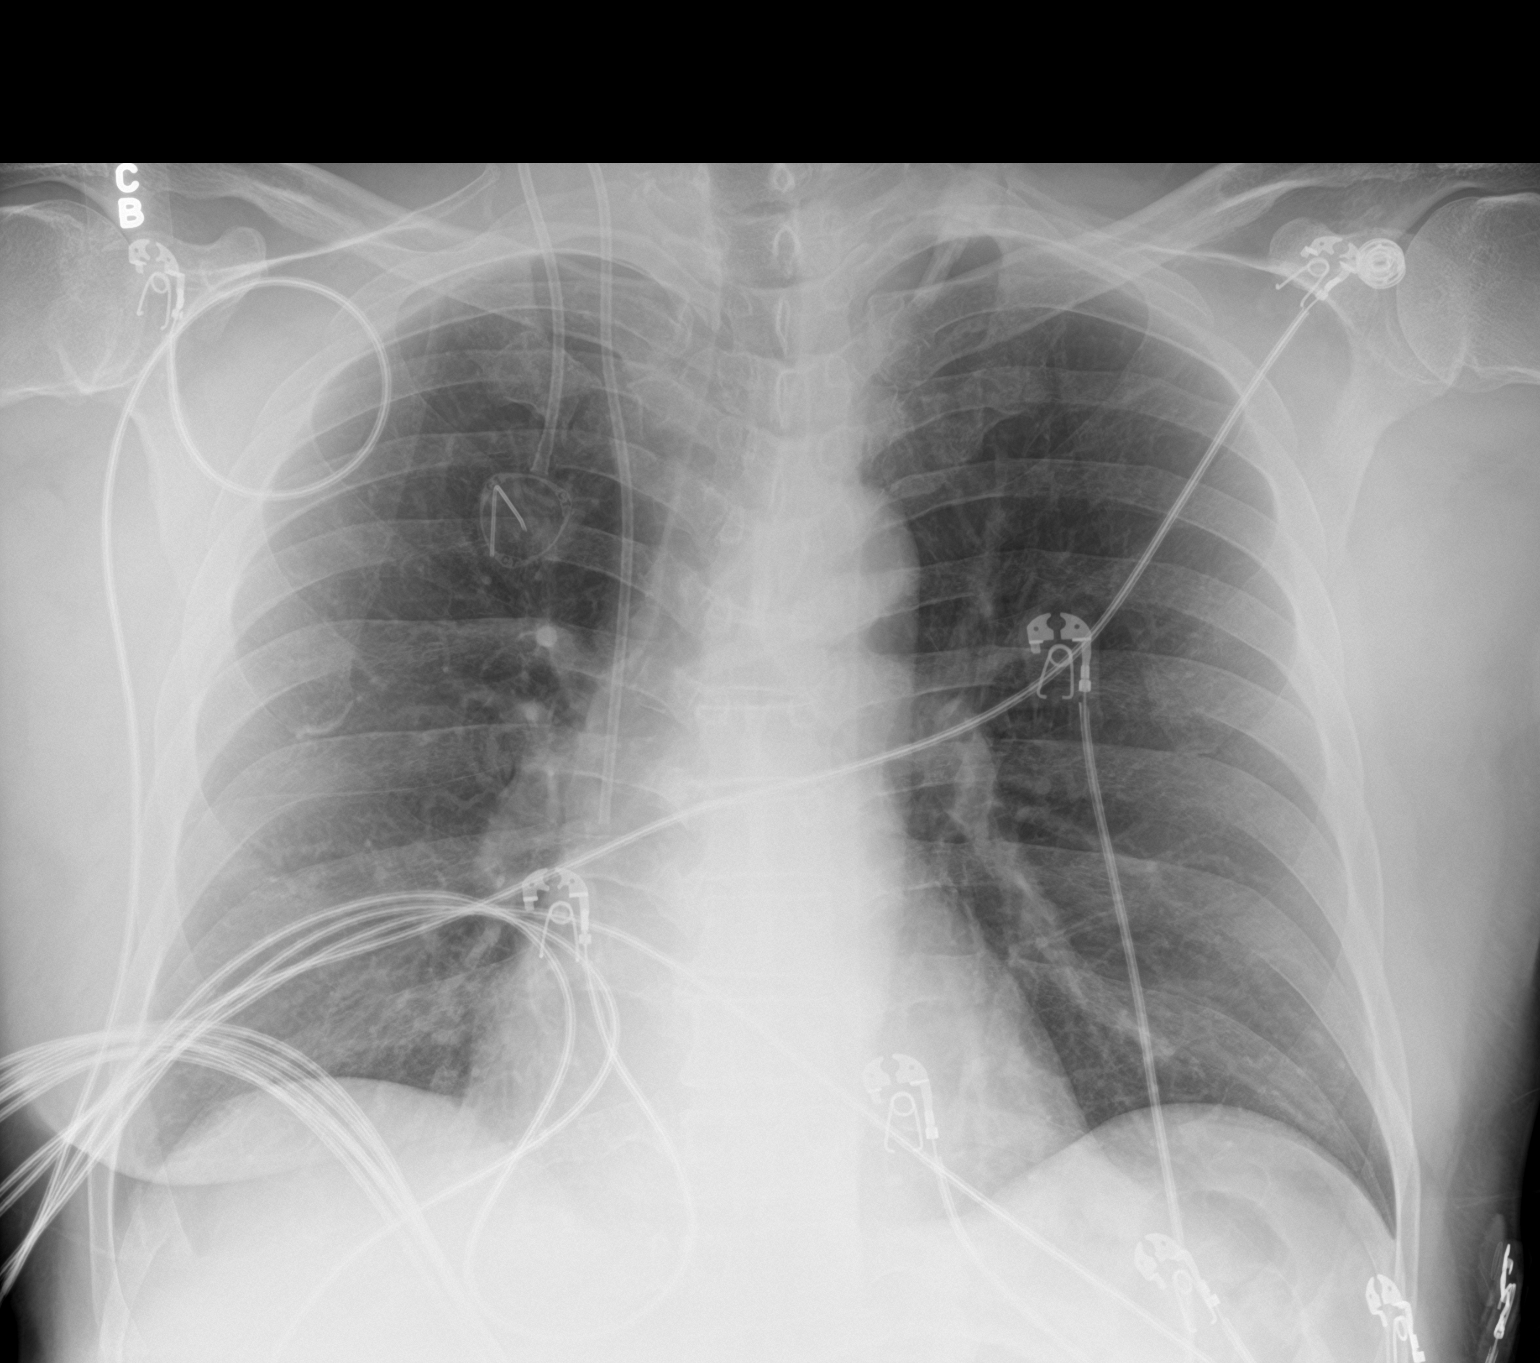

[2 of 2 positions shown; findings below may reference images not displayed]

FINDINGS: The heart size and mediastinal contours are within normal limits.
Port catheter tip from right-sided IJ approach is noted at the
cavoatrial junction. Both lungs are clear. The visualized skeletal
structures are unremarkable.
IMPRESSION: No active cardiopulmonary disease.

## 2018-12-29 IMAGING — MR MR CERVICAL SPINE WO/W CM
14 of 22 series · 23 of 48 positions shown · IV contrast (cc)
Comparison: Head CT without contrast 05/07/2017. Brain MRI
03/18/2017, cervical spine MRI 10/04/2014 and 03/31/2012

CLINICAL DATA: 56-year-old male with chronic multiple sclerosis.
Recent increased whole-body weakness.

EXAM:
MRI HEAD WITHOUT AND WITH CONTRAST
MRI CERVICAL SPINE WITHOUT AND WITH CONTRAST
TECHNIQUE: Multiplanar, multiecho pulse sequences of the brain and surrounding
structures, and cervical spine, to include the craniocervical
junction and cervicothoracic junction, were obtained without and
with intravenous contrast.
CONTRAST:  15mL MULTIHANCE GADOBENATE DIMEGLUMINE 529 MG/ML IV SOLN

[Series 3: DWI · axial · 3.0mm · 0.94mm/px · z∈[-63,+81]mm · 4 of 98 slices shown (1 of 2)]
[im 1/98]
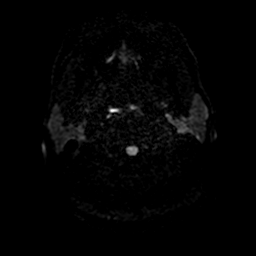
[im 33/98]
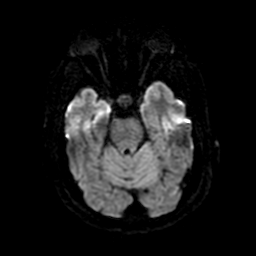
[im 65/98]
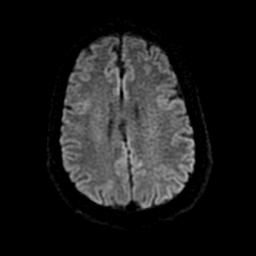
[im 98/98]
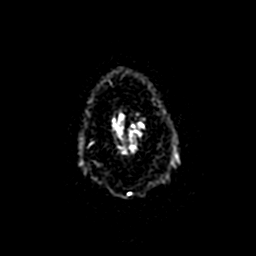

[Series 4: DWI · coronal · 4.0mm · 0.94mm/px · 3 of 72 slices shown (2 of 2)]
[im 1/72]
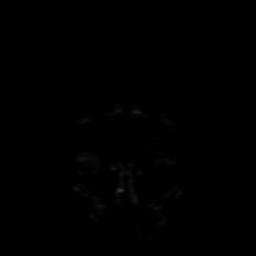
[im 36/72]
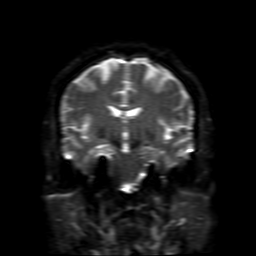
[im 72/72]
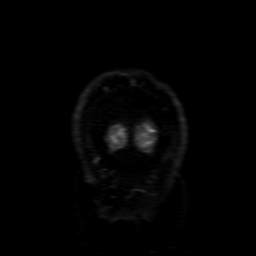

[Series 5: FLAIR · sagittal · 5.0mm · 0.47mm/px · 1 of 23 slices shown]
[im 1/23]
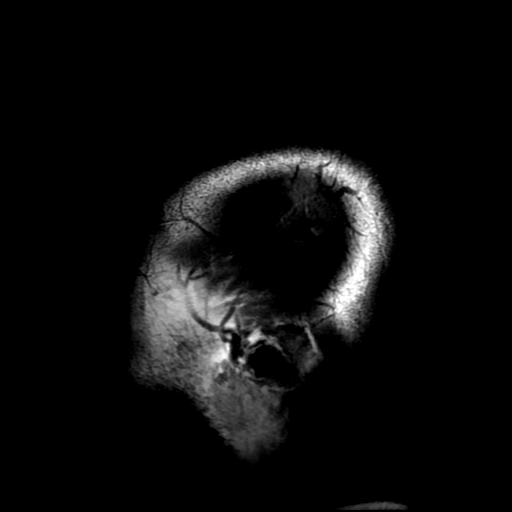

[Series 6: T2 · axial · 5.0mm · 0.43mm/px · 1 of 25 slices shown (1 of 4)]
[im 1/25]
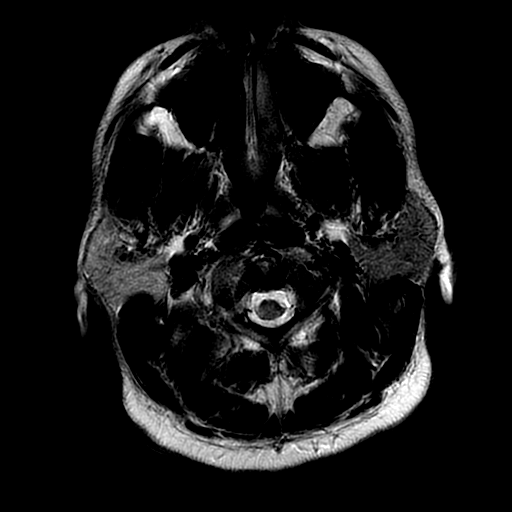

[Series 11: T2 · coronal · 5.0mm · 0.39mm/px · 1 of 30 slices shown (2 of 4)]
[im 1/30]
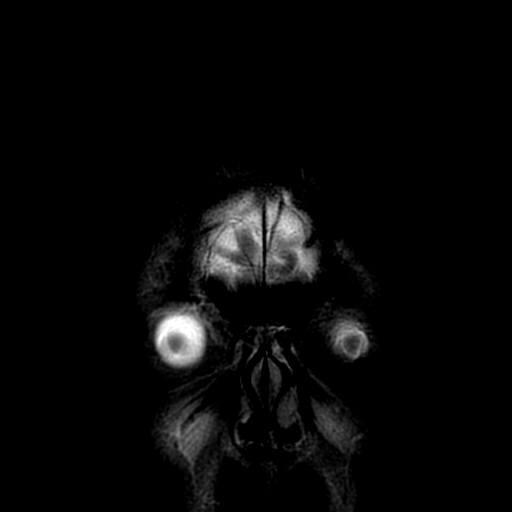

[Series 13: T2 · sagittal · 3.0mm · 0.43mm/px · 1 of 16 slices shown (3 of 4)]
[im 1/16]
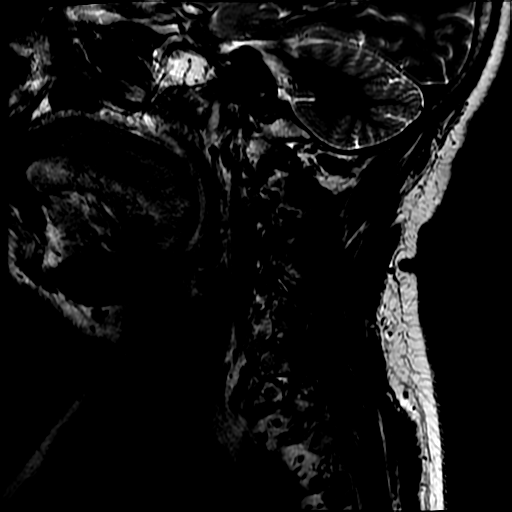

[Series 16: STIR · sagittal · 3.0mm · 0.43mm/px · 1 of 16 slices shown]
[im 1/16]
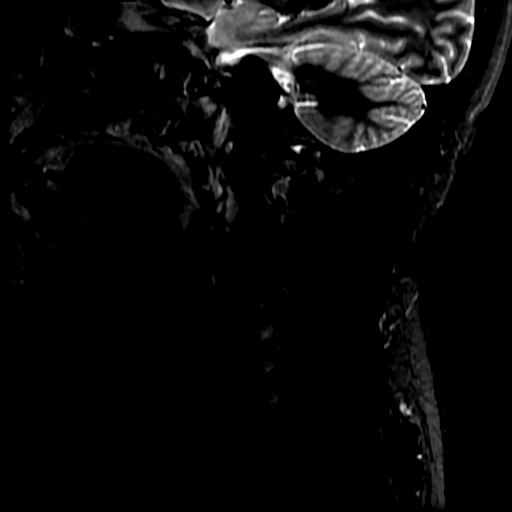

[Series 21: T1 · axial · non-contrast · 3.0mm · 0.35mm/px · z∈[-189,-85]mm · 2 of 33 slices shown (1 of 4)]
[im 1/33]
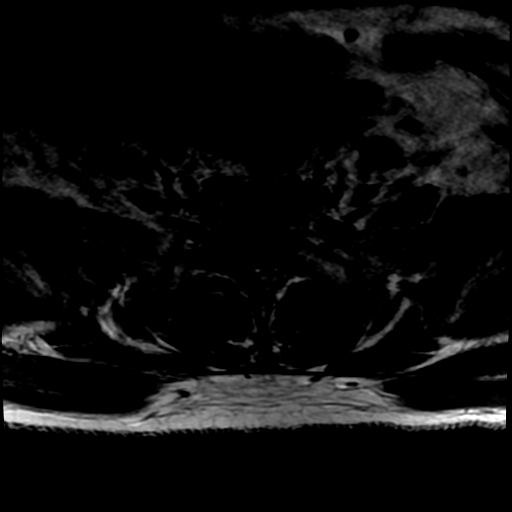
[im 33/33]
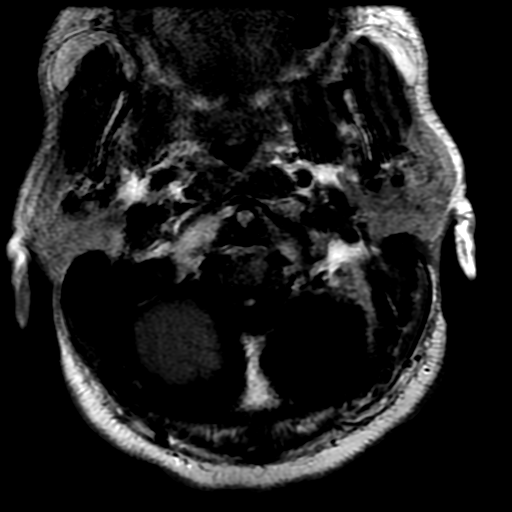

[Series 22: T2 · axial · 3.0mm · 0.35mm/px · z∈[-189,-85]mm · 2 of 33 slices shown (4 of 4)]
[im 1/33]
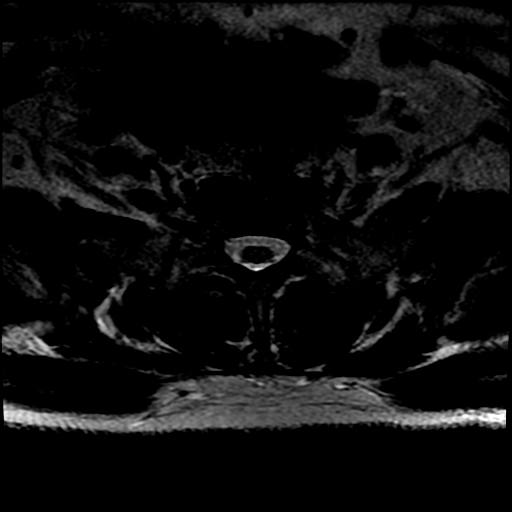
[im 33/33]
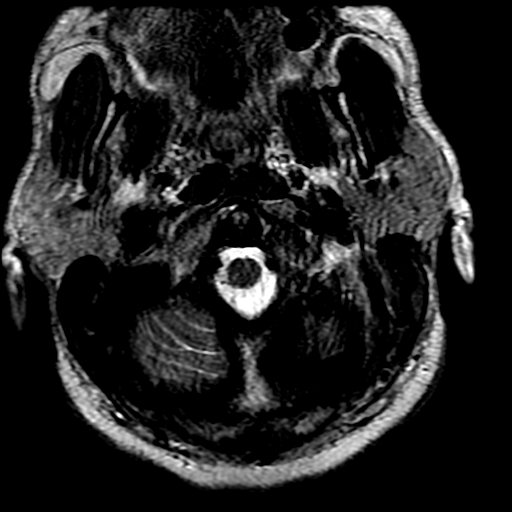

[Series 24: T1 fat-sat post-contrast · sagittal · 3.0mm · 0.43mm/px · 1 of 16 slices shown]
[im 1/16]
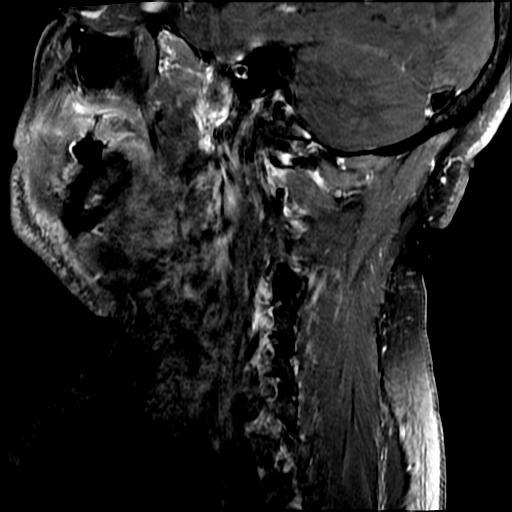

[Series 26: T1 post-contrast · axial · 3.0mm · 0.35mm/px · z∈[-189,-75]mm · 2 of 36 slices shown]
[im 1/36]
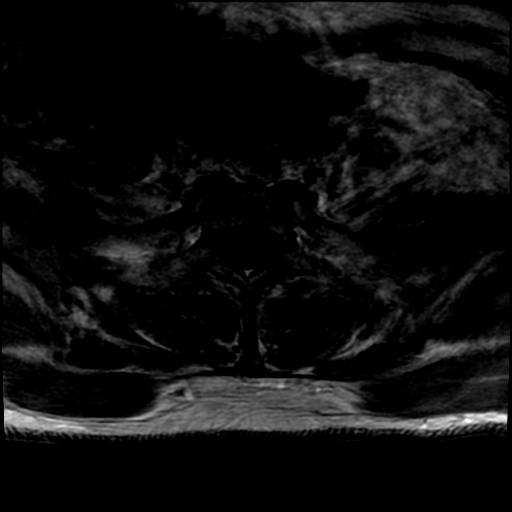
[im 36/36]
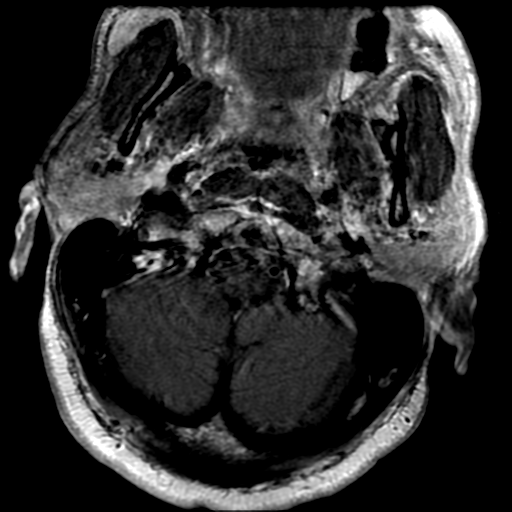

[Series 28: T1 · axial · 5.0mm · 0.47mm/px · 1 of 29 slices shown (2 of 4)]
[im 1/29]
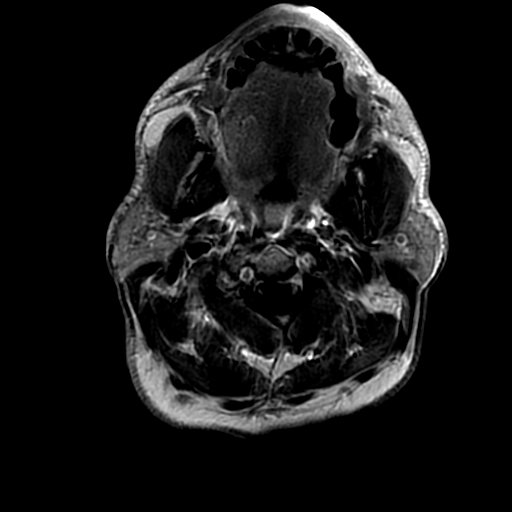

[Series 29: T1 · coronal · 5.0mm · 0.43mm/px · 1 of 31 slices shown (3 of 4)]
[im 1/31]
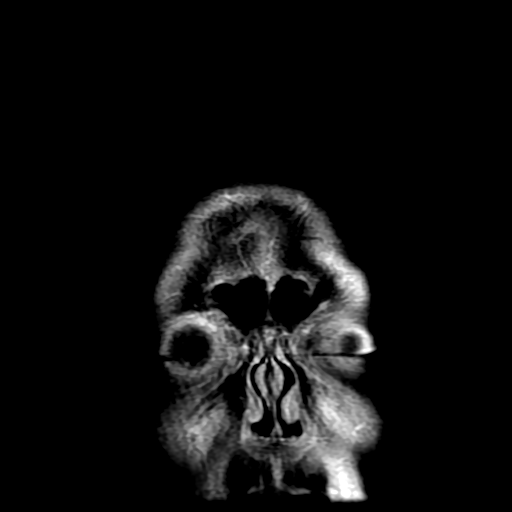

[Series 31: T1 · axial · 3.0mm · 0.70mm/px · z∈[-189,-75]mm · 2 of 36 slices shown (4 of 4)]
[im 1/36]
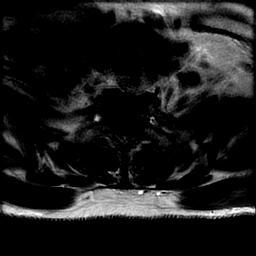
[im 36/36]
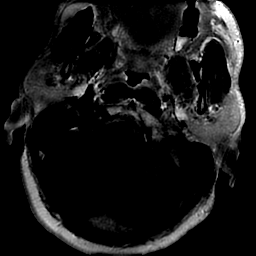

[23 of 48 positions shown; findings below may reference images not displayed]

FINDINGS: MRI HEAD FINDINGS

Brain: Stable cerebral volume. No restricted diffusion to suggest
acute infarction. No midline shift, mass effect, evidence of mass
lesion, ventriculomegaly, extra-axial collection or acute
intracranial hemorrhage. Cervicomedullary junction and pituitary are
within normal limits.

Cerebral white matter signal abnormality in the form of scattered
small foci, without a periventricular prep collection. Frontal lobe
central and subcortical white matter appears most affected. Lesser
temporal lobe involvement, mostly on the left. No corpus callosum
involvement. No cortical involvement or cortical encephalomalacia
identified. Bilateral deep gray matter nuclei, brainstem, and
cerebellum remain spared.

No progression [REDACTED]. No abnormal enhancement identified. No
restricted lesions.

Vascular: Major intracranial vascular flow voids are stable.

Skull and upper cervical spine: Normal bone marrow signal.

Sinuses/Orbits: Stable orbits soft tissues.

Paranasal sinuses and mastoids are stable and well pneumatized.

Other: Visible internal auditory structures appear normal.

MRI CERVICAL SPINE FINDINGS

Alignment: Straightening of cervical lordosis.

Vertebrae: No marrow edema or evidence of acute osseous abnormality.

Cord: Due to significant recurrent motion artifact on cervical spine
imaging since 2700, spinal cord lesions today are compared to 7060.
Furthermore, the axial T2 and T2* imaging today is degraded by
motion despite repeated imaging attempts.

Significant STIR heterogeneity throughout the cervical spinal cord
with patchy hyperintense cord lesions at the dorsal C2-C3 level,
left dorsal C4-C5 level, right dorsal C6 level, and ventral C6-C7
level. See series 16, image 8. This appears progressed since 7060,
although patchy cord heterogeneity was present at that time. There
is no associated spinal cord enhancement or abnormal intradural
enhancement or dural thickening.

Posterior Fossa, vertebral arteries, paraspinal tissues: Preserved
vascular flow voids in the neck. Negative neck soft tissues.

Disc levels: Chronic disc and endplate degeneration at C3-C4 and
C5-C6 with up to mild spinal stenosis at both levels. Chronic
moderate to severe bilateral C4 neural foraminal stenosis. Chronic
moderate left C6 neural foraminal stenosis. Cervical spine
degeneration has not significantly progressed since 7060.
IMPRESSION: 1. Relatively mild chronic cerebral white matter signal abnormality
is stable, with no evidence of acute or progressive intracranial
demyelination.
2. Fairly advanced chronic cervical spinal cord demyelination, with
progression of disease since 7060, but no active cervical cord
demyelination.
3. No new intracranial abnormality.
4. Chronic C3-C4 and C5-C6 spinal degeneration is stable since [DATE].

## 2018-12-31 IMAGING — MR MR LUMBAR SPINE WO/W CM
10 of 19 series · 26 of 48 positions shown · IV contrast (multihance)
Comparison: Cervical MRI 08/06/2017, chest CT 11/15/2016. Thoracic
MRI 12/13/2005.

CLINICAL DATA: Muscle weakness. Multiple sclerosis. Chronic pain
syndrome.

EXAM:
MRI THORACIC AND LUMBAR SPINE WITHOUT AND WITH CONTRAST
TECHNIQUE: Multiplanar and multiecho pulse sequences of the thoracic and lumbar
spine were obtained without and with intravenous contrast.
CONTRAST:  20 ml MultiHance.

[Series 4: T2 · sagittal · 4.0mm · 0.55mm/px · 2 of 14 slices shown (1 of 5)]
[im 1/14]
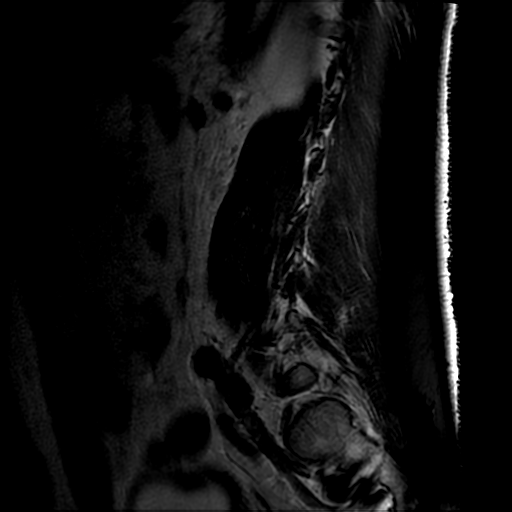
[im 14/14]
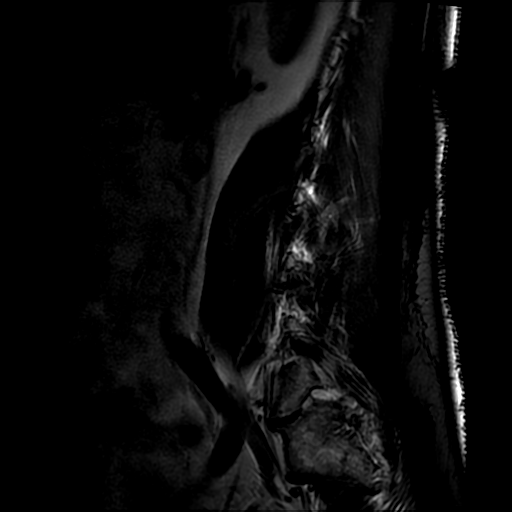

[Series 5: T1 · sagittal · 4.0mm · 0.55mm/px · 1 of 14 slices shown (1 of 4)]
[im 1/14]
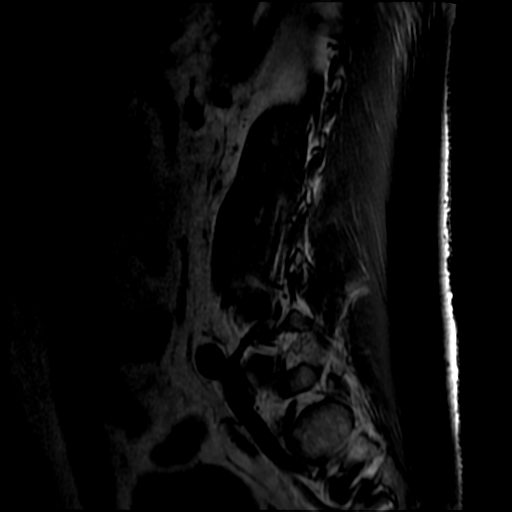

[Series 7: T2 · axial · 4.0mm · 0.39mm/px · z∈[+9,+203]mm · 4 of 40 slices shown (2 of 5)]
[im 1/40]
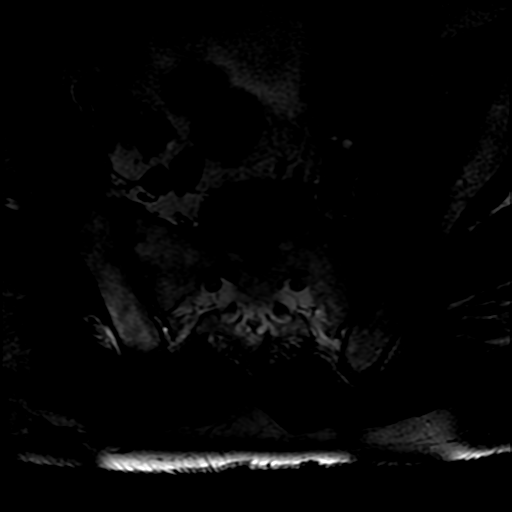
[im 14/40]
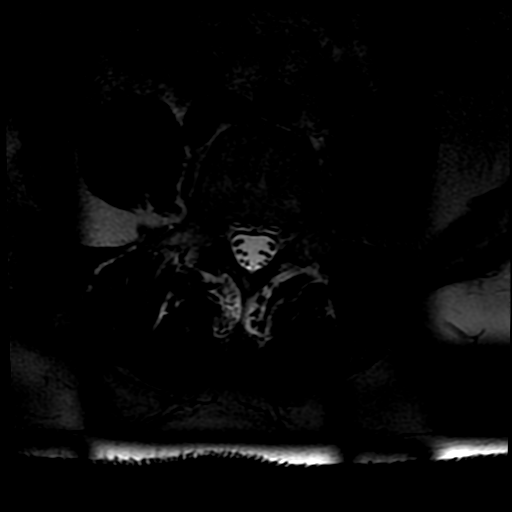
[im 27/40]
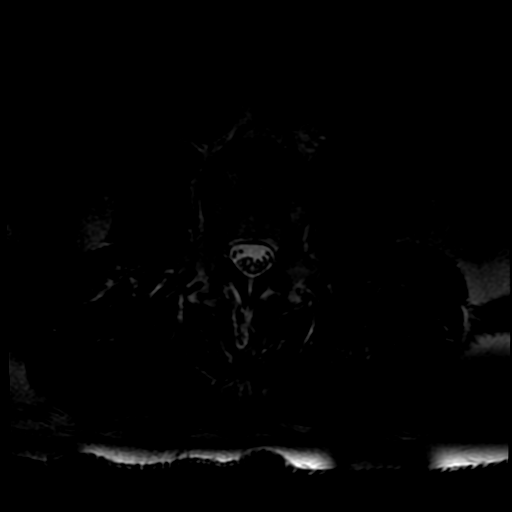
[im 40/40]
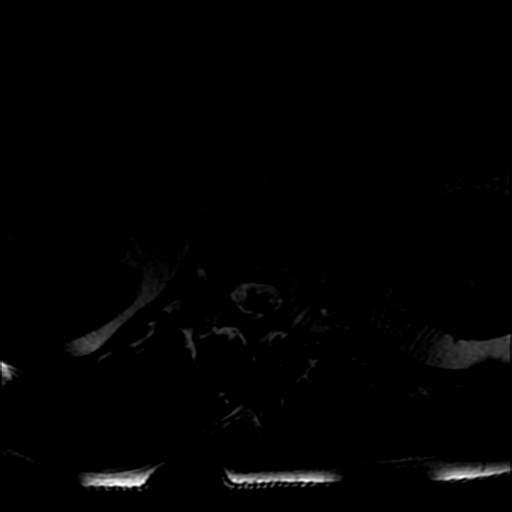

[Series 10: T1 · axial · 4.0mm · 0.78mm/px · z∈[+9,+203]mm · 4 of 40 slices shown (2 of 4)]
[im 1/40]
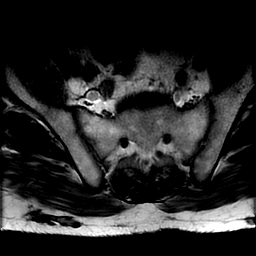
[im 14/40]
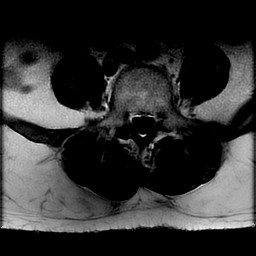
[im 27/40]
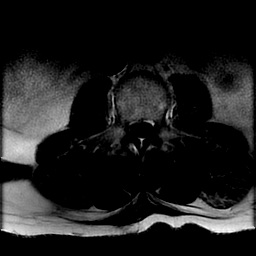
[im 40/40]
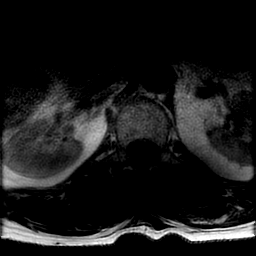

[Series 13: T1 · sagittal · 3.0mm · 0.98mm/px · 1 of 12 slices shown (3 of 4)]
[im 1/12]
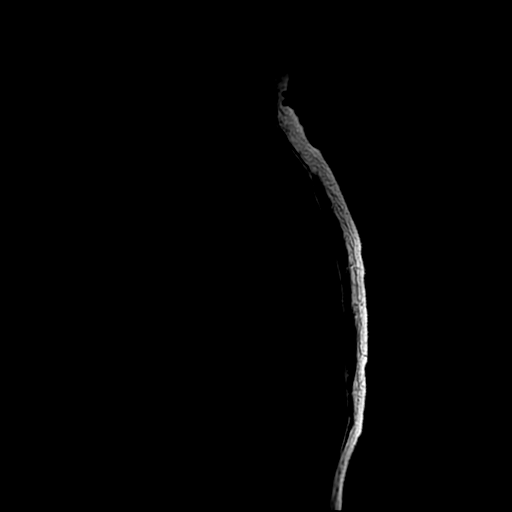

[Series 15: T2 · sagittal · 3.0mm · 0.66mm/px · 2 of 16 slices shown (3 of 5)]
[im 1/16]
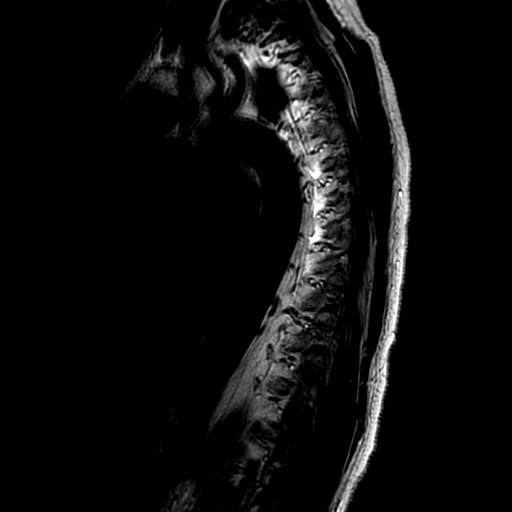
[im 16/16]
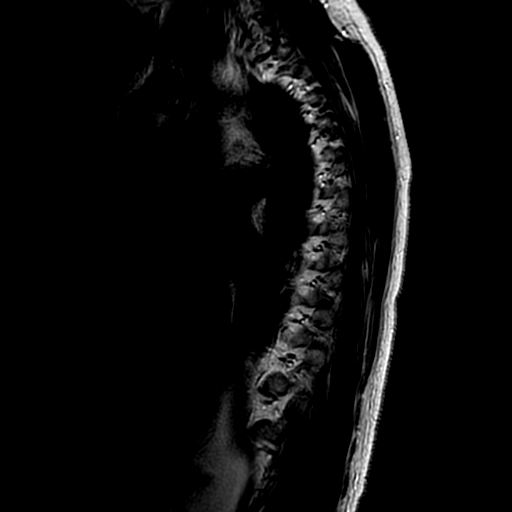

[Series 18: T2 · axial · 4.0mm · 0.78mm/px · z∈[+309,+497]mm · 4 of 36 slices shown (4 of 5)]
[im 1/36]
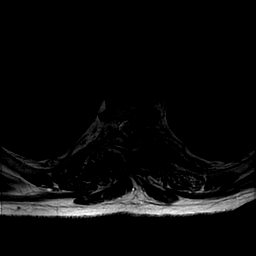
[im 12/36]
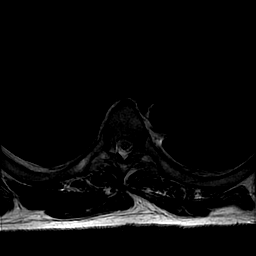
[im 24/36]
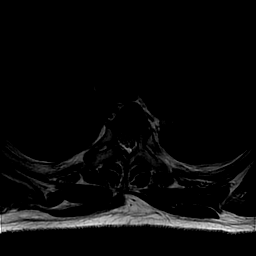
[im 36/36]
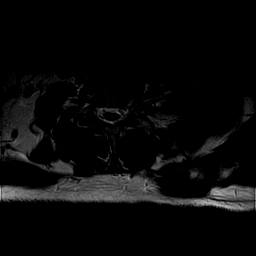

[Series 19: T2 · axial · 4.0mm · 0.39mm/px · z∈[+232,+346]mm · 2 of 22 slices shown (5 of 5)]
[im 1/22]
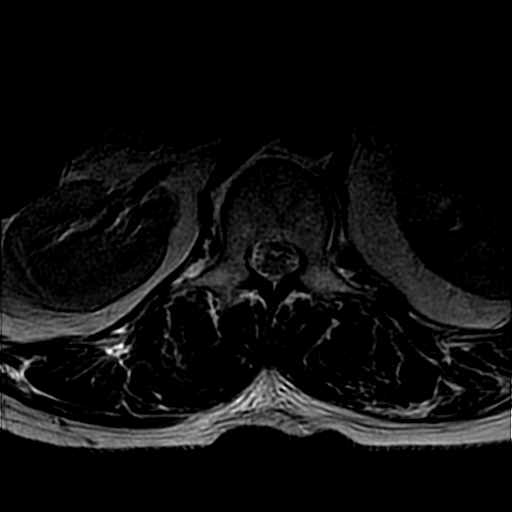
[im 22/22]
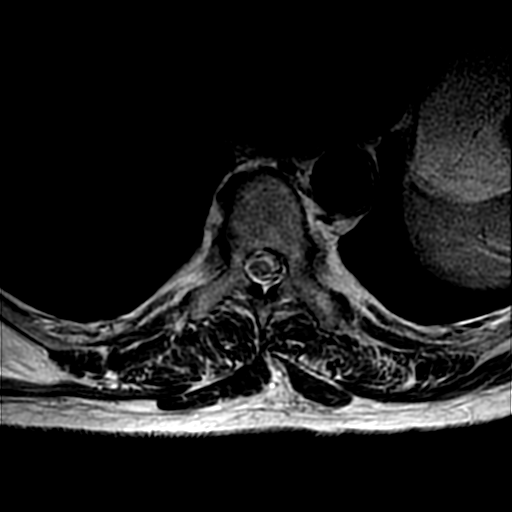

[Series 24: T1 · axial · 4.0mm · 0.78mm/px · z∈[+309,+497]mm · 4 of 36 slices shown (4 of 4)]
[im 1/36]
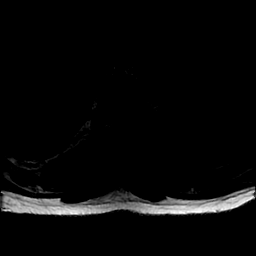
[im 12/36]
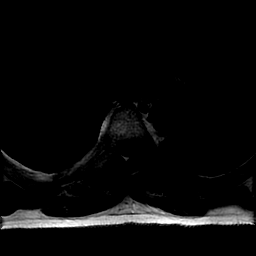
[im 24/36]
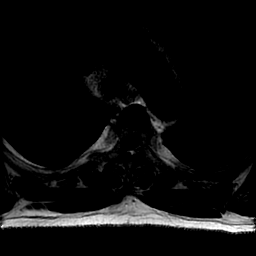
[im 36/36]
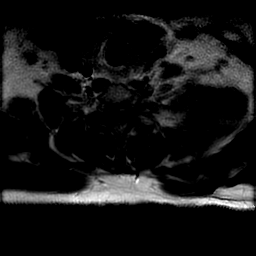

[Series 27: T1 fat-sat post-contrast · sagittal · 3.0mm · 0.66mm/px · 2 of 16 slices shown]
[im 1/16]
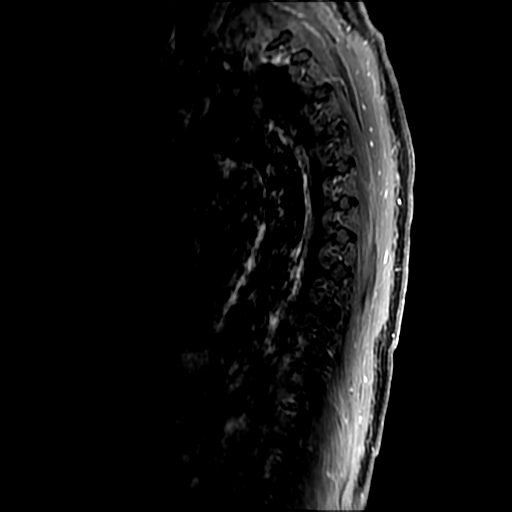
[im 16/16]
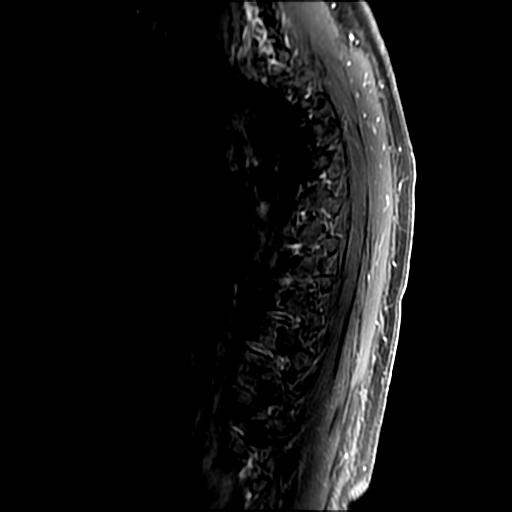

[26 of 48 positions shown; findings below may reference images not displayed]

FINDINGS: MRI THORACIC SPINE FINDINGS

Alignment: There is a stable S-shaped scoliosis of the upper to mid
thoracic spine. The lateral alignment is normal.

Vertebrae: No vertebral anomaly or acute osseous findings.

Cord: There is mild T2 hyperintensity in the right aspect of the
cord at the T1-2 level. No other definite abnormal cord signal
demonstrated. There is questionable T2 hyperintensity in the cord at
the T12-L1 level on the sagittal inversion recovery images through
the lumbar spine (image 80 of series 6). There is no abnormal cord
enhancement. Cord evaluation is mildly limited by motion artifact.

Paraspinal and other soft tissues: No paraspinal abnormalities are
seen.

Disc levels:

No large disc herniation, spinal stenosis or nerve root
encroachment. There is mild endplate osteophyte formation in the
thoracic spine. There is mild disc bulging at C5-6.

MRI LUMBAR SPINE FINDINGS

Segmentation: Counted from above, there is transitional lumbosacral
anatomy with a nearly fully lumbarized S1 segment. By this
numbering, there is an old healed fracture of the L1 vertebral body
which appears stable.

Alignment:  Normal.

Vertebrae: As above, transitional lumbosacral anatomy with a
transitional S1 segment. Old L1 fracture is healed without osseous
retropulsion. No worrisome osseous findings.

Conus medullaris: Extends to the L2 level and appears normal. No
abnormal intradural enhancement.

Paraspinal and other soft tissues: No paraspinal abnormalities are
seen. There are small renal cysts bilaterally.

Disc levels:

The lumbar discs are well hydrated with maintained height. There is
no disc herniation, spinal stenosis or nerve root encroachment. Mild
facet and ligamentous hypertrophy is present at the L4-5 and L5-S1
levels. There is no resulting foraminal compromise.
IMPRESSION: 1. As seen on recent cervical MRI, there is mild T2 hyperintensity
in the cord on the right at T1-2 consistent with demyelination.
Questionable demyelinating lesion at T12- L1.
2. No other demyelinating lesions identified in the cord. No cord
enlargement or abnormal enhancement.
3. No disc herniation, spinal stenosis or nerve root encroachment.
4. Transitional lumbosacral anatomy with a nearly fully lumbarized
S1 segment.

## 2019-01-28 IMAGING — US US ABDOMEN LIMITED
1 series · 14 of 25 positions shown · non-contrast
Comparison: Abdominal series [DATE] 02/27/2017. Ultrasound 11/15/2016.
CT 11/15/2016.

CLINICAL DATA: Chronic hepatitis-C.

EXAM:
US ABDOMEN LIMITED - RIGHT UPPER QUADRANT

[Series 1: us abdomen limited · 0.23mm/px · 14 of 32 slices shown]
[im 1/32]
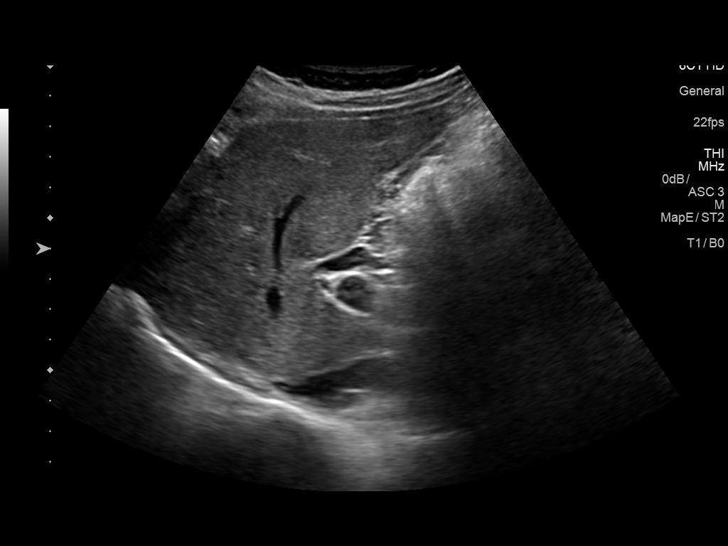
[im 3/32]
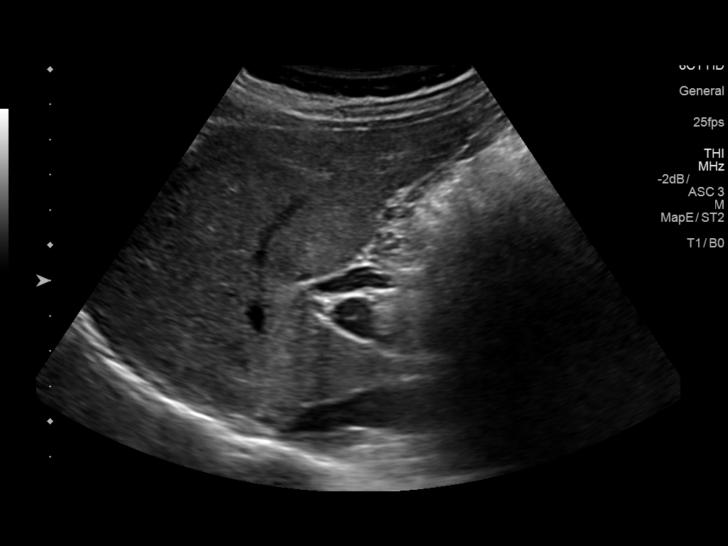
[im 6/32]
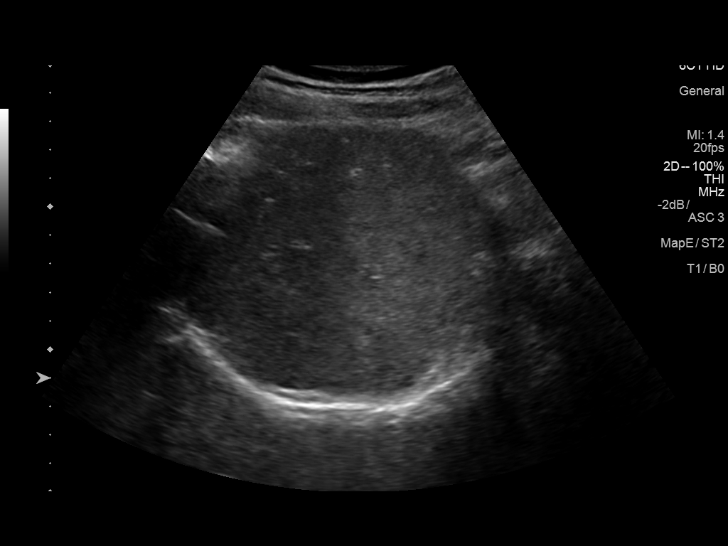
[im 8/32]
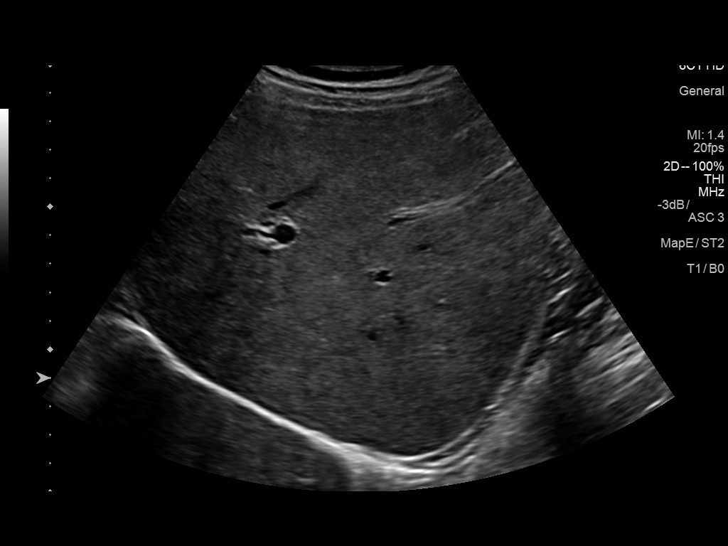
[im 11/32]
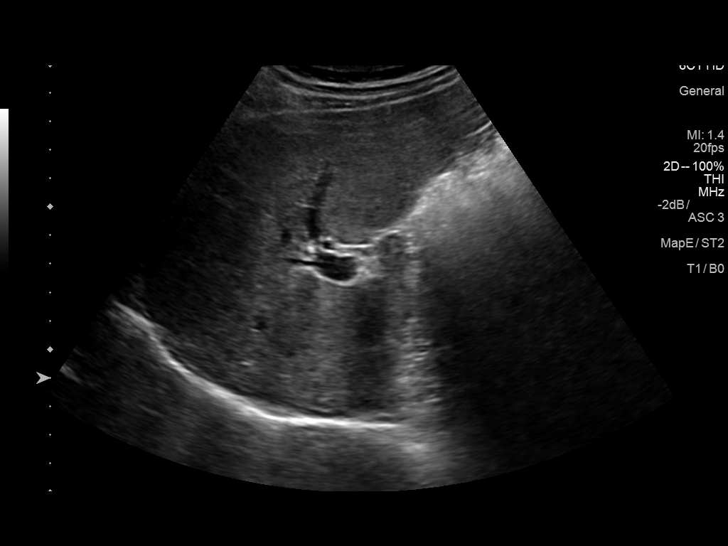
[im 12/32]
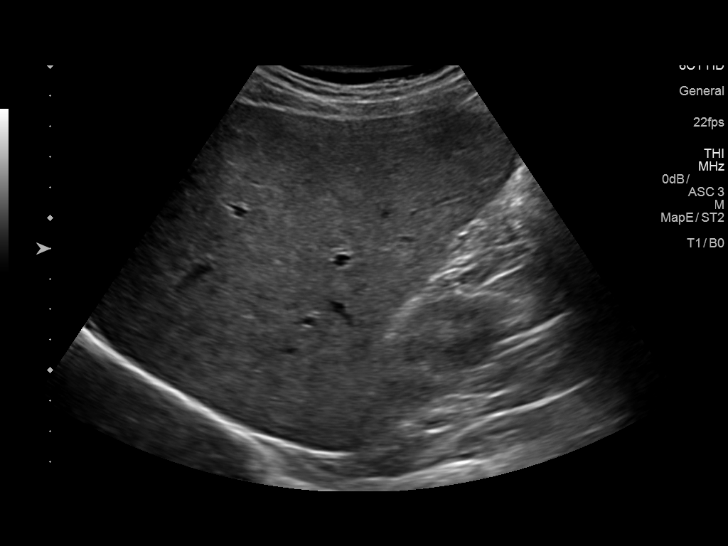
[im 15/32]
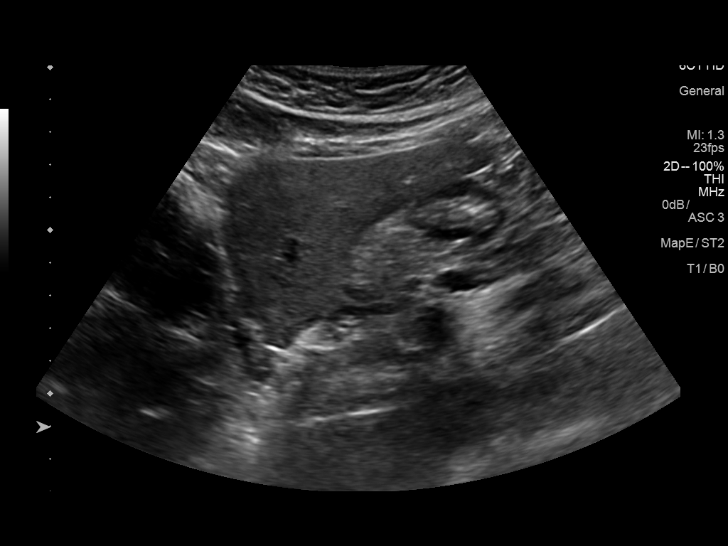
[im 17/32]
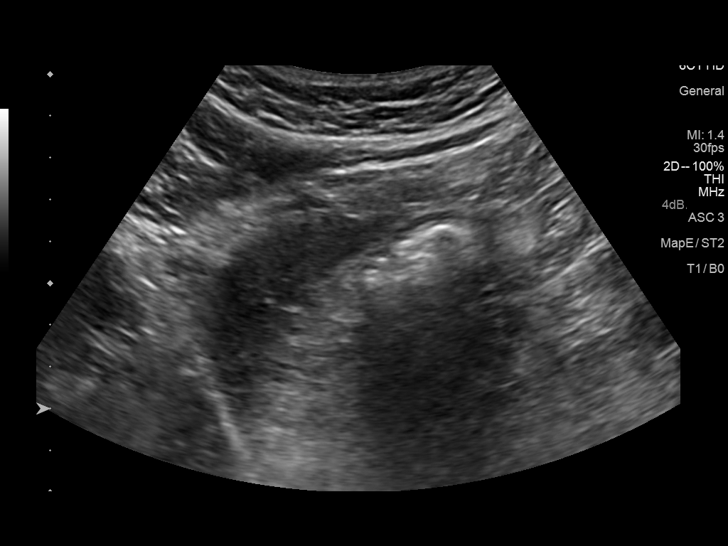
[im 20/32]
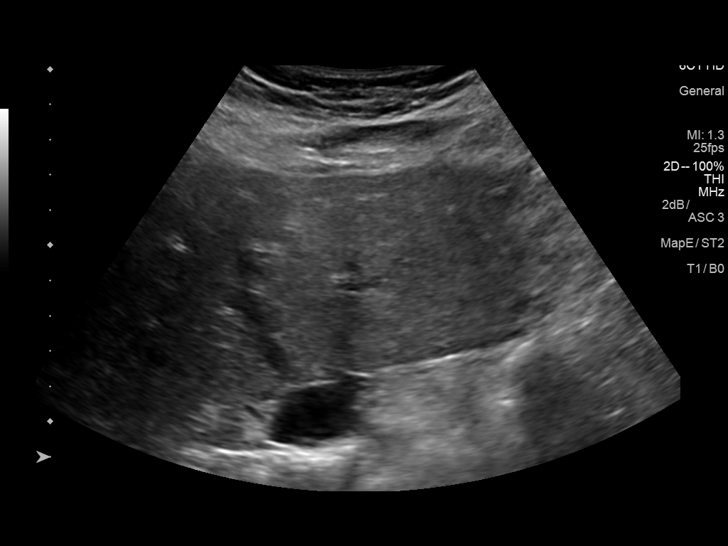
[im 21/32]
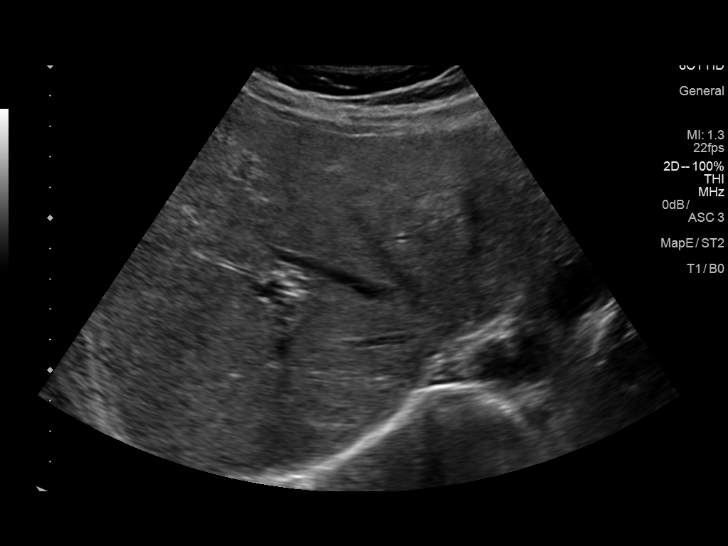
[im 24/32]
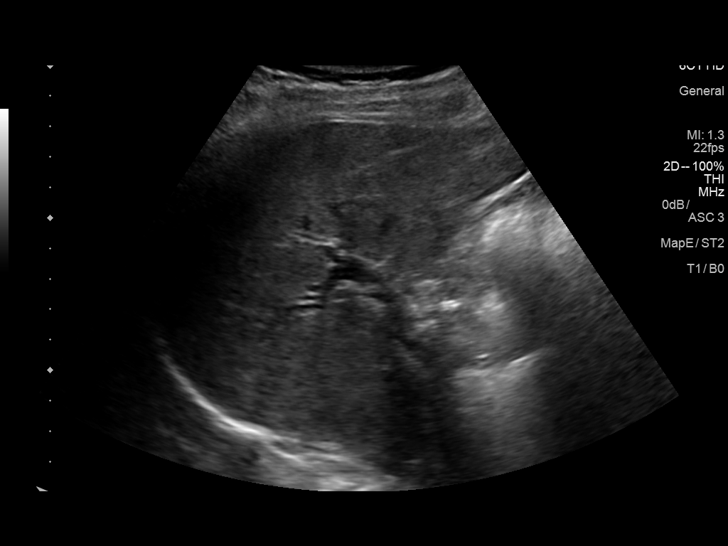
[im 26/32]
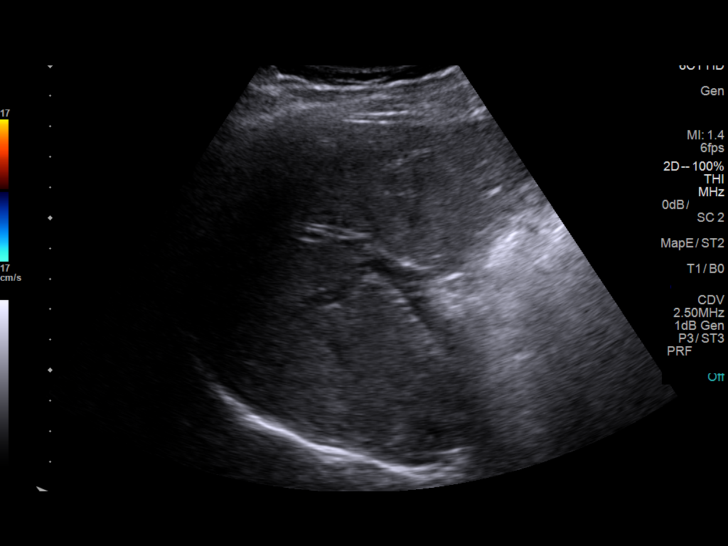
[im 29/32]
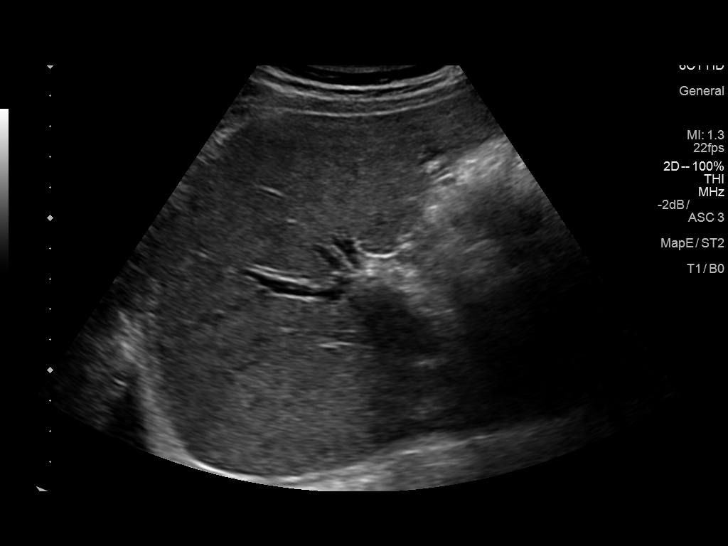
[im 32/32]
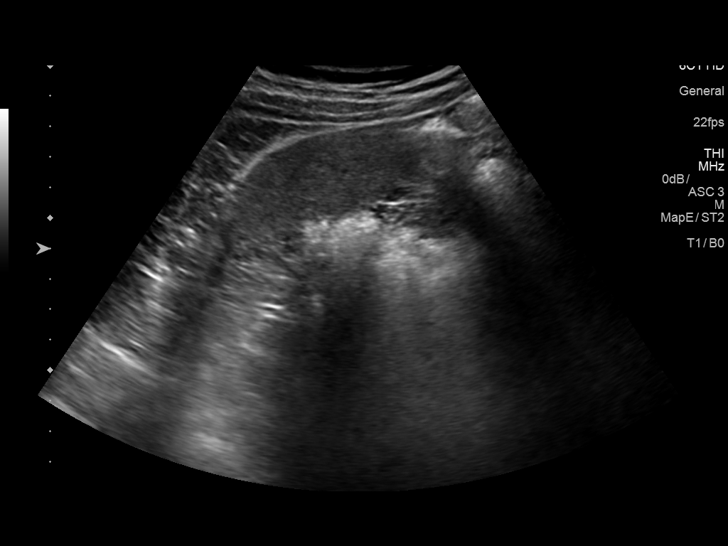

[14 of 25 positions shown; findings below may reference images not displayed]

FINDINGS: Gallbladder:

Cholecystectomy.

Common bile duct:

Diameter: 6.1 mm

Liver:

Slight increase in echogenicity of the liver. This could be
secondary to fatty infiltration and/or hepatocellular disease. No
focal hepatic abnormality identified. Portal vein is patent.
Questionable trace ascites.
IMPRESSION: 1. Cholecystectomy.  No biliary distention.

2. Mild increased echogenicity liver suggesting fatty infiltration
and/or hepatocellular disease. No focal hepatic abnormality
identified. Questionable trace ascites.

## 2019-02-25 IMAGING — CR DG CHEST 2V
2 series · 2 of 2 positions shown · non-contrast
Comparison: 08/04/2017

CLINICAL DATA: Central chest pain.

EXAM:
CHEST  2 VIEW

[w chest lat]
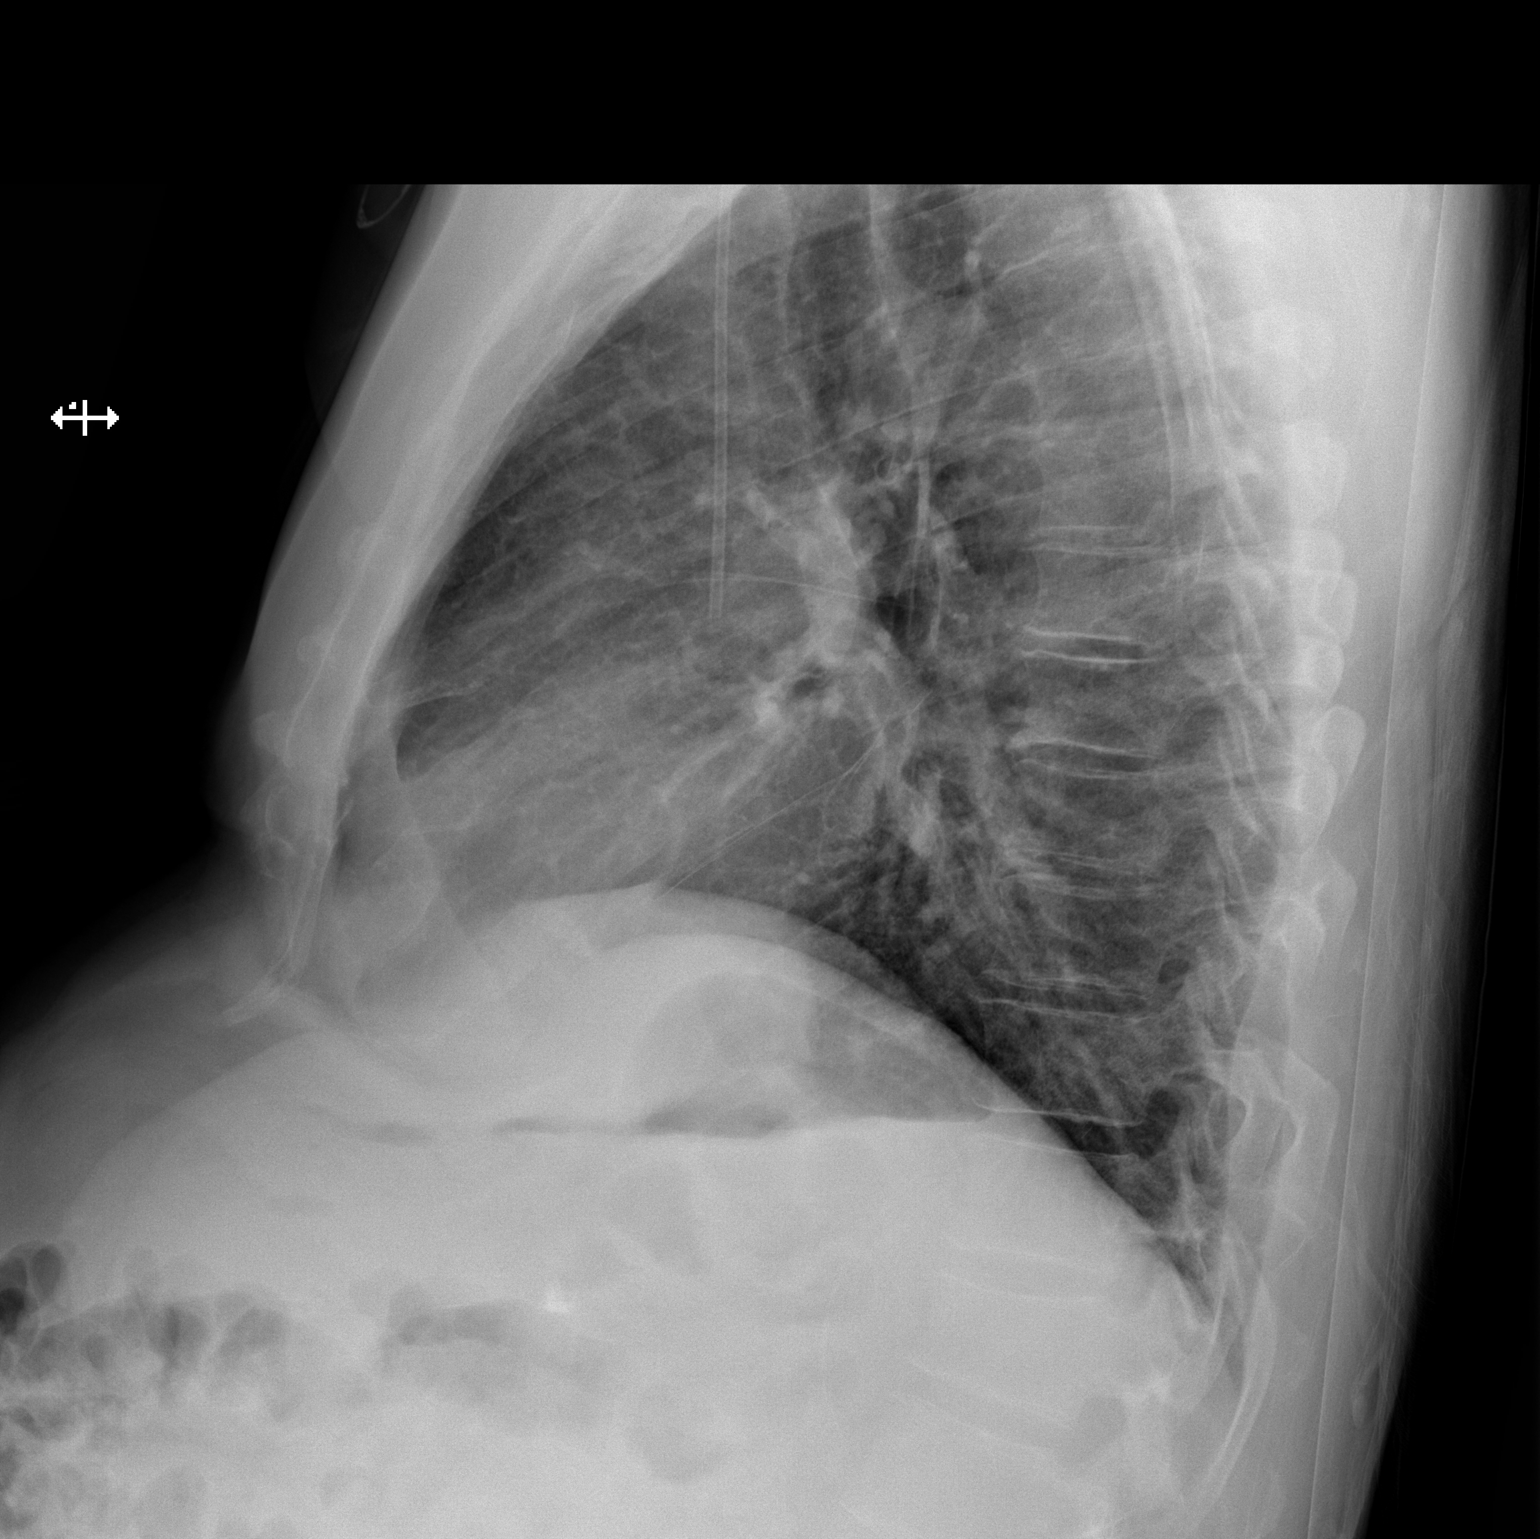

[x chest ap]
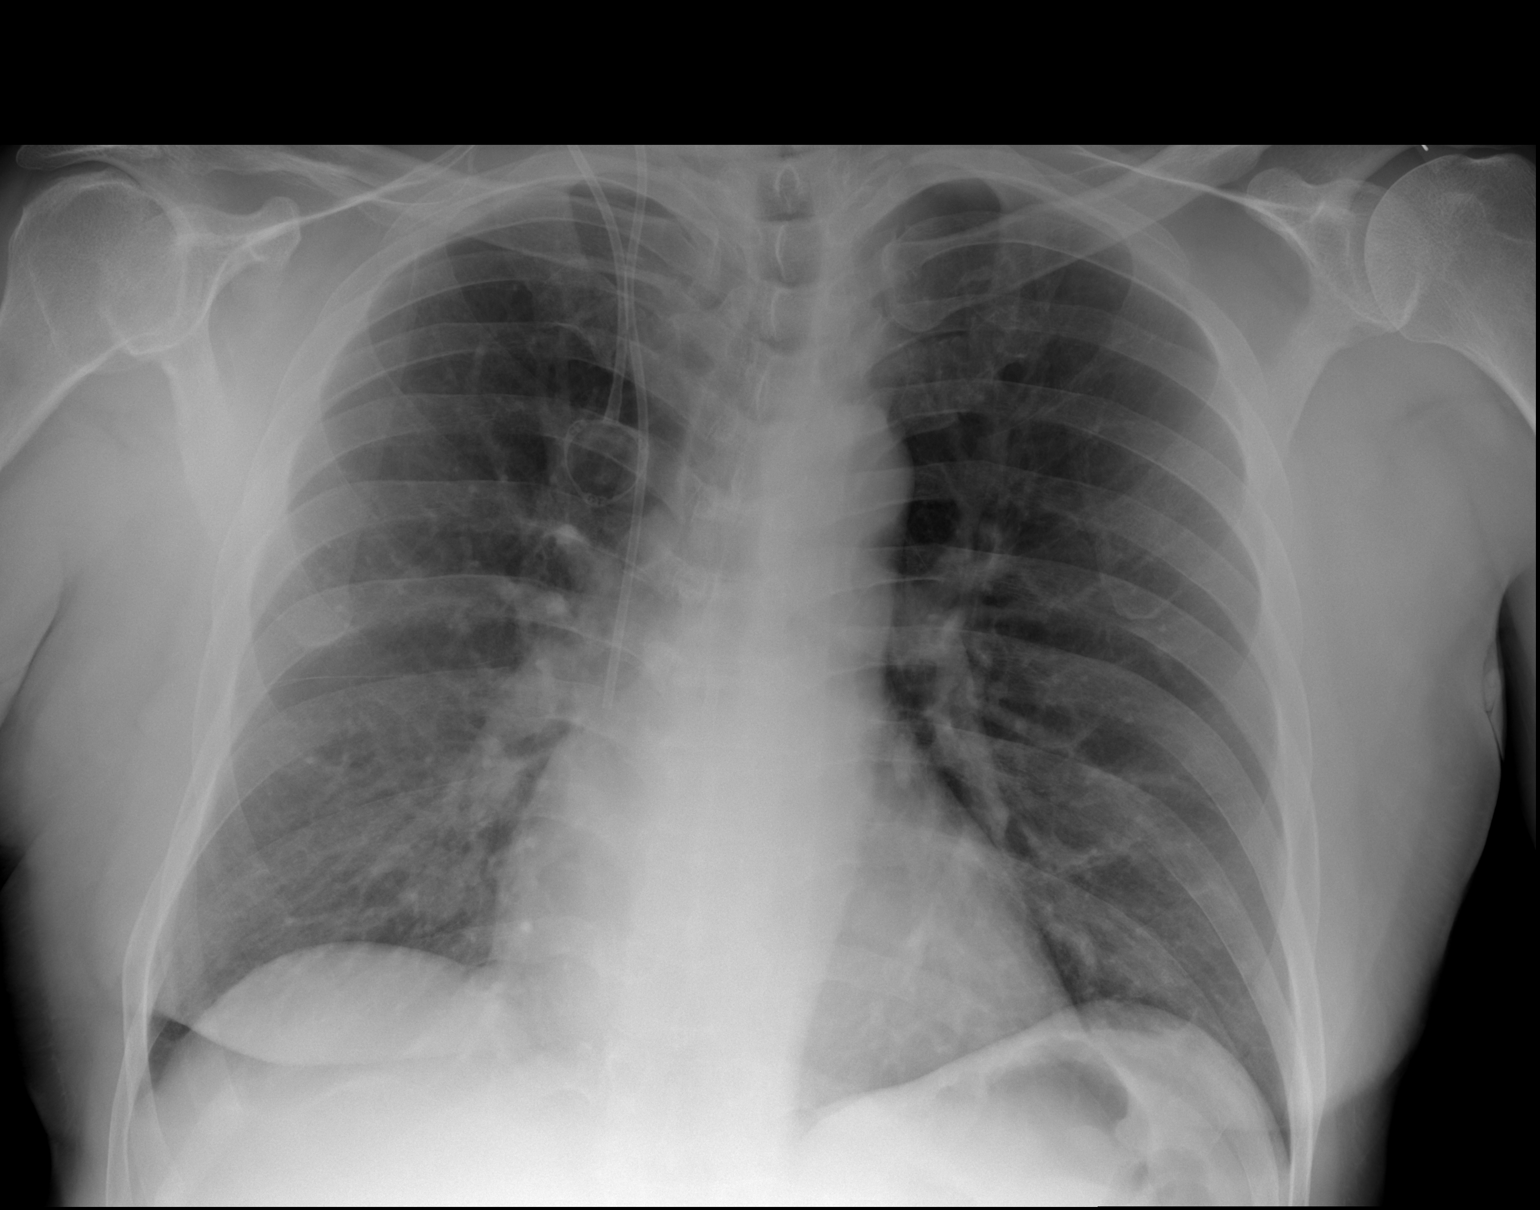

[2 of 2 positions shown; findings below may reference images not displayed]

FINDINGS: The heart size and mediastinal contours are within normal limits.
Port catheter tip is seen in the distal SVC. Both lungs are clear.
The visualized skeletal structures are unremarkable.
IMPRESSION: No active cardiopulmonary disease.

## 2019-03-25 ENCOUNTER — Other Ambulatory Visit: Payer: Self-pay | Admitting: Psychiatry

## 2019-03-25 DIAGNOSIS — M25561 Pain in right knee: Secondary | ICD-10-CM

## 2019-03-25 DIAGNOSIS — G35 Multiple sclerosis: Secondary | ICD-10-CM

## 2019-04-15 ENCOUNTER — Ambulatory Visit
Admission: RE | Admit: 2019-04-15 | Discharge: 2019-04-15 | Disposition: A | Discharge status: Home / Self Care | Payer: 59 | Source: Ambulatory Visit | Attending: Psychiatry | Admitting: Psychiatry

## 2019-04-15 ENCOUNTER — Ambulatory Visit: Payer: Self-pay | Admitting: Hematology and Oncology

## 2019-04-15 ENCOUNTER — Other Ambulatory Visit: Payer: Self-pay | Admitting: Psychiatry

## 2019-04-15 ENCOUNTER — Other Ambulatory Visit: Payer: Self-pay

## 2019-04-15 ENCOUNTER — Ambulatory Visit: Payer: Self-pay

## 2019-04-15 DIAGNOSIS — G35 Multiple sclerosis: Secondary | ICD-10-CM

## 2019-04-15 DIAGNOSIS — M25561 Pain in right knee: Secondary | ICD-10-CM

## 2019-04-18 ENCOUNTER — Other Ambulatory Visit: Payer: Self-pay

## 2019-04-18 DIAGNOSIS — G35 Multiple sclerosis: Secondary | ICD-10-CM

## 2019-04-18 NOTE — Progress Notes (Signed)
[facility] [facility]  [address], [address] [place], [place] [address] Phone: [phone]  Fax: [phone]   Clinic Day:  04/19/2019  Referring physician: Fleet ContrasAvbuere, Edwin, MD  Chief Complaint: [name] is a 58 y.o. male with multiple sclerosis who is seen for new patient assessment and continuation of Rituxan.   HPI:   The patient was diagnosed with multiple sclerosis in 2005. 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 He was referred by Dr. Harriette Bouillonouglas Jeffrey.  He received Tysabri monthly.  He was initially seen by Dr. Orlie DakinFinnegan for Tysabri infusions on 02/08/2014.  JC virus testing  was performed by his neurologist.   He was switched to Ocrevus on 07/17/2017, as he was undergoing treatment for Hepatitis C.  Tysabri was discontinued.  He was switched to Rituxan on 05/07/2018 for progressive weakness between treatments.  He received treatment on 05/07/2018, 05/24/2018, 09/03/2018, and 12/24/2018.  The patient was last seen in the medical oncology clinic on 09/03/2018 by Dr Orlie DakinFinnegan.  At that time, he was at his baseline. He denied any new neurologic complaints. He received Rituxan.   He was seen in orthopedics by Dr. Gershon MusselNaiping Xu on 09/21/2018 for right shoulder and bilateral knee pain and received steroid injections.   [facility]  for chest pain and MS flare on 11/04/2018.  EKG was negative. He was discharged with a short course of tramadol and recommended to follow-up with his PCP for management of his MS and a psychiatrist for his concerns about stress.   [facility] from 11/27/2018 - 12/01/2018 following a fall and for depression and anxiety. He received Keflex and Bactrim for cellulitis in his right leg.  He was cleared by Psychiatry.  He remained in the ED due to no available beds.  Staff attempted to find him long term housing in a facility or with family.    His last  Rituxan infusion was on 12/24/2018.   During the interim, he reports feeling "okay". He denies any fevers, sweats, chest pain, or vision changes.   He reports that MS makes his "nerves hurt really bad," in addition to giving him headaches.  He has difficulty walking and uses a cane or walker. In the clinic today, he is sitting comfortably in a wheelchair. He reports pain and weakness on the right side of his body. He has trouble with balance and coordination.  He has a history of hepatitis C in 2017 and was treated by Dr. Calabasas Callasynthia Snidor at [facility].   He denies any history of hepatitis B.  He is visibly upset that he cannot receive treatment today.    Past Medical History:  Diagnosis Date  . Abdominal pain, unspecified site   . Anxiety   . Arthritis   . Benign paroxysmal positional vertigo   . Cellulitis and abscess of right leg 04/2017  . Chronic back pain   . Chronic pain syndrome 01/25/2008  . Cluster headache   . Depression    takes Zoloft daily  . DVT (deep venous thrombosis) (HCC)    in the left arm '09  . Gait abnormality    "uses mobile wheelchair, but is ambulatory"  . Gallstones 02/17/2009   resolved after gallbladder surgery.  [name]. GERD (gastroesophageal reflux disease)    takes Omeprazole as needed  . Headache(784.0)    cluster headaches frequently-takes Topamax daily  .  History of colonoscopy   . HTN (hypertension)    takes Lisinopril,Verapamil,and Triamterene HCTZ daily  . Insomnia 11/06/2008  . Joint pain   . Joint swelling    03-07-16 "swelling of right wrist" "after a fall-xray done 03-06-16 "no fractures".  . Memory loss    no an issue at present 03-07-16  . Multiple sclerosis (HCC)    Dx. 2005 - Dr. Tinnie Gens follows LOV 4'17 tx. Tysabri monthly [facility] [place].  [name] Nonspecific elevation of levels of transaminase or lactic acid dehydrogenase (LDH)   . Other specified visual disturbances   . Other syndromes affecting  cervical region   . Pneumonia 2009  . Trigeminal neuralgia     history" Multiple sclerosis"    Past Surgical History:  Procedure Laterality Date  . CHOLECYSTECTOMY  02/20/2009  . COLONOSCOPY WITH PROPOFOL N/A 03/17/2016   Procedure: COLONOSCOPY WITH PROPOFOL;  Surgeon: [name], MD;  Location: [facility];  Service: Endoscopy;  Laterality: N/A;  . PORT A CATH REVISION N/A 07/06/2015   Procedure: Removal and replacement of PORT A CATH;  Surgeon: [name], MD;  Location: [facility] OR;  Service: General;  Laterality: N/A;  . PORTACATH PLACEMENT N/A 03/27/2014   Procedure: INSERTION PORT-A-CATH;  Surgeon: [name], MD;  Location: [facility] OR;  Service: General;  Laterality: N/A;    Family History  Problem Relation Age of Onset  . Cancer Father   . Diabetes Mother     Social History:  reports that he has quit smoking. His smoking use included cigarettes. He has a 38.00 pack-year smoking history. He has never used smokeless tobacco. He reports current alcohol use. He reports that he does not use drugs.  He smokes 1/2 a pack per day, and drinks occasionally. He is disabled.  He is going into an assisted living facility today called [address] in [place], [place]. The patient is alone today.   Allergies:  Allergies  Allergen Reactions  . Acyclovir And Related Other (See Comments)    Reaction not recalled by the patient    Current Medications: Current Outpatient Medications  Medication Sig Dispense Refill  . AMPYRA 10 MG TB12 Take 10 mg by mouth 2 (two) times daily.    . baclofen (LIORESAL) 20 MG tablet Take 1 tablet (20 mg total) by mouth 4 (four) times daily. For muscle spasms (Patient taking differently: Take 20 mg by mouth 4 (four) times daily as needed for muscle spasms. ) 30 each 0  . clotrimazole-betamethasone (LOTRISONE) cream Apply 1 application topically 2 (two) times daily as needed (for irritation- AS DIRECTED).   2  . dicyclomine (BENTYL) 20 MG tablet Take 20 mg by  mouth every 6 (six) hours as needed for spasms.   1  . doxycycline (VIBRAMYCIN) 100 MG capsule Take 100 mg by mouth 2 (two) times daily.  5  . gabapentin (NEURONTIN) 600 MG tablet Take 600 mg by mouth 4 (four) times daily.   2  . hydrochlorothiazide (HYDRODIURIL) 25 MG tablet Take 25 mg by mouth daily.    [name] omeprazole (PRILOSEC) 40 MG capsule Take 1 capsule (40 mg total) by mouth every evening. For acid reflux (Patient taking differently: Take 40 mg by mouth daily. For acid reflux) 10 capsule 0  . Oxcarbazepine (TRILEPTAL) 300 MG tablet Take 1 tablet (300 mg total) by mouth 3 (three) times daily. For mood stabilization 90 tablet 0  . oxyCODONE-acetaminophen (PERCOCET) 10-325 MG tablet Take 1 tablet by mouth every 8 (eight)  hours as needed for pain.  0  . risperiDONE (RISPERDAL) 0.25 MG tablet Take 1 tablet (0.25 mg total) by mouth at bedtime. 20 tablet 0  . riTUXimab (RITUXAN IV) Inject 1 Applicatorful into the vein every 4 (four) months.     . sertraline (ZOLOFT) 50 MG tablet Take 1 tablet (50 mg total) by mouth daily. For depression 30 tablet 0  . topiramate (TOPAMAX) 100 MG tablet Take 150 mg by mouth at bedtime.   1  . topiramate (TOPAMAX) 25 MG tablet Take 75 mg by mouth every morning.     . traMADol (ULTRAM) 50 MG tablet Take 1 tablet (50 mg total) by mouth every 6 (six) hours as needed. (Patient taking differently: Take 50 mg by mouth every 6 (six) hours as needed for moderate pain. ) 15 tablet 0  . traZODone (DESYREL) 100 MG tablet Take 100 mg by mouth at bedtime.     . verapamil (CALAN) 80 MG tablet Take 80 mg by mouth 2 (two) times daily.    Carlena Hurl. XARELTO 10 MG TABS tablet Take 1 tablet (10 mg total) by mouth daily. For blood clot prevention 10 tablet 0  . fluocinonide cream (LIDEX) 0.05 % Apply 1 application topically 2 (two) times daily as needed (to affected areas, as directed- avoid face, groin, and axillae).   3  . hydrOXYzine (ATARAX/VISTARIL) 25 MG tablet Take 1 tablet (25 mg total) by  mouth every 6 (six) hours. (Patient not taking: Reported on 04/19/2019) 12 tablet 0  . permethrin (ELIMITE) 5 % cream Apply 1 application topically daily as needed (skin care).     . SUMAtriptan (IMITREX) 100 MG tablet Take 100 mg by mouth See admin instructions. Take 100 mg by mouth at onset of headache, may repeat in 2 hours, if needed- not to exceed 2 tablets/24 hrs  5   No current facility-administered medications for this visit.    Facility-Administered Medications Ordered in Other Visits  Medication Dose Route Frequency Provider Last Rate Last Dose  . heparin lock flush 100 unit/mL  500 Units Intravenous Once Corcoran, Melissa C, MD      . sodium chloride 0.9 % injection 10 mL  10 mL Intracatheter PRN Herring, Vear ClockLeslie F, NP   10 mL at 11/07/15 1012  . sodium chloride flush (NS) 0.9 % injection 10 mL  10 mL Intravenous PRN Rosey Bathorcoran, Melissa C, MD   10 mL at 04/19/19 0820    Review of Systems  Constitutional: Negative for chills, fever, malaise/fatigue and weight loss.  HENT: Negative.  Negative for congestion, ear pain, hearing loss, nosebleeds, sinus pain and sore throat.   Eyes: Negative.  Negative for blurred vision, double vision, photophobia and pain.  Respiratory: Negative for cough, shortness of breath and wheezing.   Cardiovascular: Negative.  Negative for chest pain, palpitations, orthopnea, claudication, leg swelling and PND.  Gastrointestinal: Negative.  Negative for abdominal pain, blood in stool, constipation, diarrhea, melena and nausea.  Genitourinary: Negative.  Negative for dysuria, frequency and urgency.  Musculoskeletal: Negative.  Negative for back pain, joint pain and myalgias.  Skin: Negative.  Negative for rash.  Neurological: Positive for tingling (right side), sensory change (right side) and focal weakness. Negative for dizziness and headaches.       Notes "right side numb and weak".  Coordination and balance issues.  Walks with a cane or walker.   Endo/Heme/Allergies: Negative.  Does not bruise/bleed easily.  Psychiatric/Behavioral: Negative.  Negative for depression and memory loss. The patient is  not nervous/anxious and does not have insomnia.   All other systems reviewed and are negative.  Performance status (ECOG): 2-3  Physical Exam  Constitutional: He is oriented to person, place, and time. He appears well-developed and well-nourished. No distress.  Gentleman sitting comfortably in a wheelchair in the exam room.  HENT:  Head: Normocephalic and atraumatic.  Mouth/Throat: No oropharyngeal exudate.  Wearing a hat and mask.  Mustache.  Dry mouth.  Eyes: Pupils are equal, round, and reactive to light. Conjunctivae and EOM are normal. No scleral icterus.  Glasses.  Brown eyes.  Neck: Normal range of motion. Neck supple. No JVD present.  Cardiovascular: Normal rate, regular rhythm and normal heart sounds.  No murmur heard. Pulmonary/Chest: Effort normal and breath sounds normal. No respiratory distress. He has no wheezes.  Port-a-cath in place.  Abdominal: Soft. Bowel sounds are normal. He exhibits no distension and no mass. There is no abdominal tenderness. There is no rebound and no guarding.  Musculoskeletal: Normal range of motion.        General: No edema.  Lymphadenopathy:    He has no cervical adenopathy.    He has no axillary adenopathy.       Right: No supraclavicular adenopathy present.       Left: No supraclavicular adenopathy present.  Neurological: He is alert and oriented to person, place, and time.  Skin: Skin is warm and dry. No rash noted. He is not diaphoretic. No erythema. No pallor.  Psychiatric: His behavior is normal. Judgment and thought content normal. His affect is angry. His speech is not slurred (reptitive speech pattern).  Upset that he is not receiving an infusion today.  Nursing note and vitals reviewed.   Infusion on 04/19/2019  Component Date Value Ref Range Status  . Sodium 04/19/2019 140   135 - 145 mmol/L Final  . Potassium 04/19/2019 3.1* 3.5 - 5.1 mmol/L Final  . Chloride 04/19/2019 112* 98 - 111 mmol/L Final  . CO2 04/19/2019 PENDING  22 - 32 mmol/L Incomplete  . Glucose, Bld 04/19/2019 94  70 - 99 mg/dL Final  . BUN 16/10/960406/07/2019 15  6 - 20 mg/dL Final  . Creatinine, Ser 04/19/2019 0.79  0.61 - 1.24 mg/dL Final  . Calcium 54/09/811906/07/2019 8.5* 8.9 - 10.3 mg/dL Final  . Total Protein 04/19/2019 7.1  6.5 - 8.1 g/dL Final  . Albumin 14/78/295606/07/2019 3.8  3.5 - 5.0 g/dL Final  . AST 21/30/865706/07/2019 13* 15 - 41 U/L Final  . ALT 04/19/2019 13  0 - 44 U/L Final  . Alkaline Phosphatase 04/19/2019 123  38 - 126 U/L Final  . Total Bilirubin 04/19/2019 0.3  0.3 - 1.2 mg/dL Final  . GFR calc non Af Amer 04/19/2019 >60  >60 mL/min Final  . GFR calc Af Amer 04/19/2019 >60  >60 mL/min Final   Performed at [facility], [address] [place], [place] [address]  . Anion gap 04/19/2019 PENDING  5 - 15 Incomplete  . WBC 04/19/2019 5.9  4.0 - 10.5 K/uL Final  . RBC 04/19/2019 4.87  4.22 - 5.81 MIL/uL Final  . Hemoglobin 04/19/2019 13.6  13.0 - 17.0 g/dL Final  . HCT 29/52/841306/07/2019 42.2  39.0 - 52.0 % Final  . MCV 04/19/2019 86.7  80.0 - 100.0 fL Final  . MCH 04/19/2019 27.9  26.0 - 34.0 pg Final  . MCHC 04/19/2019 32.2  30.0 - 36.0 g/dL Final  . RDW 24/40/102706/07/2019 15.6* 11.5 - 15.5 % Final  .  Platelets 04/19/2019 224  150 - 400 K/uL Final  . nRBC 04/19/2019 0.0  0.0 - 0.2 % Final  . Neutrophils Relative % 04/19/2019 58  % Final  . Neutro Abs 04/19/2019 3.5  1.7 - 7.7 K/uL Final  . Lymphocytes Relative 04/19/2019 28  % Final  . Lymphs Abs 04/19/2019 1.7  0.7 - 4.0 K/uL Final  . Monocytes Relative 04/19/2019 9  % Final  . Monocytes Absolute 04/19/2019 0.5  0.1 - 1.0 K/uL Final  . Eosinophils Relative 04/19/2019 3  % Final  . Eosinophils Absolute 04/19/2019 0.2  0.0 - 0.5 K/uL Final  . Basophils Relative 04/19/2019 1  % Final  . Basophils Absolute 04/19/2019 0.0  0.0 - 0.1 K/uL Final  .  Immature Granulocytes 04/19/2019 1  % Final  . Abs Immature Granulocytes 04/19/2019 0.04  0.00 - 0.07 K/uL Final   Performed at [facility], [address], [place], [place] [address]    Assessment:  [name] is a 58 y.o. male with multiple sclerosis.  He was treated with Tysabri then switched to Ocrevus on 07/17/2017.  He began Rituxan on 05/07/2018.  Last infusion was on 12/24/2018.  He has a history of hepatitis C s/p treatment.  Hepatitis C genotype was 2b.  Hepatitis C RNA by PCR was < 15 IU/ml on 09/03/2017.  Hepatitis B core antibody total was + on 05/28/2016.  Hepatitia B DNA, ultraquantitative was < 20 IU/ml on 07/08/2016.    Symptomatically, he notes right sided numbness and weakness.  He states that Rituxan has been helping.  Plan: 1.   Labs today:   CBC with diff, CMP, hepatitis B surface antigen, hepatitis B core antibody total, hepatitis B DNA, hepatitis C antibody, hepatitis C RNA quantitative. 2.   Multiple sclerosis   Discuss entire medical history, diagnosis and management of multiple sclerosis.  Discuss treatment with Rituxan every 4 months.  Last infusion was 12/24/2018.  Discuss potential side effects of Rituxan including allergic reaction and reactivation of hepatitis B  Discuss concern for + hepatitis B testing in past.  Discuss postponing treatment and retesting.   Patient unhappy about not receiving treatment.     Discuss risk of fulminant hepatitis B and potential liver failure with Rituxan.   Patients typically seen by GI and empirically placed on an anti-viral such as entecavir and continued until 1 year post last Rituxan infusion.   Patient upset as he has received Rituxan in the past and wants to receive today.  It is unclear if the results of testing in 2017 was known.  Patient states that he is moving to [place] today.  Records can be sent to his new provider(s). 3.   No treatment today.  Patient moving to [place]. 4.   RTC prn.   Addendum:  Labs today were notable for hepatitis B core antibody total positive, hepatitis B surface antigen negative, and hepatitis C antibody positive.  Hepatitis B DNA by PCR was negative.  Quantiferon gold was positive.  Potassium was 3.1.  LFTs were normal.  The patient's primary care provider and neurologist were notified regarding his testing.  The health department was notified regarding his quantiferon gold results.  I discussed the assessment and treatment plan with the patient.  The patient was provided an opportunity to ask questions and all were answered.  The patient agreed with the plan and demonstrated an understanding of the instructions.  The patient was advised to call back if the symptoms worsen or  if the condition fails to improve as anticipated.   Lequita Asal, MD, PhD    04/19/2019, 9:01 AM  I, Molly Dorshimer, am acting as Education administrator for [employer]. Mike Gip, MD, PhD.  I, Melissa C. Mike Gip, MD, have reviewed the above documentation for accuracy and completeness, and I agree with the above.

## 2019-04-19 ENCOUNTER — Other Ambulatory Visit: Payer: Self-pay | Admitting: Hematology and Oncology

## 2019-04-19 ENCOUNTER — Inpatient Hospital Stay: Payer: 59

## 2019-04-19 ENCOUNTER — Telehealth: Payer: Self-pay

## 2019-04-19 ENCOUNTER — Other Ambulatory Visit: Payer: Self-pay

## 2019-04-19 ENCOUNTER — Encounter: Payer: Self-pay | Admitting: Hematology and Oncology

## 2019-04-19 ENCOUNTER — Inpatient Hospital Stay: Payer: 59 | Attending: Oncology | Admitting: Hematology and Oncology

## 2019-04-19 VITALS — BP 105/63 | HR 62 | Resp 18 | Ht 69.0 in | Wt 172.0 lb

## 2019-04-19 DIAGNOSIS — Z8619 Personal history of other infectious and parasitic diseases: Secondary | ICD-10-CM | POA: Diagnosis not present

## 2019-04-19 DIAGNOSIS — L03115 Cellulitis of right lower limb: Secondary | ICD-10-CM | POA: Diagnosis not present

## 2019-04-19 DIAGNOSIS — G35 Multiple sclerosis: Secondary | ICD-10-CM | POA: Insufficient documentation

## 2019-04-19 DIAGNOSIS — M25562 Pain in left knee: Secondary | ICD-10-CM | POA: Diagnosis not present

## 2019-04-19 DIAGNOSIS — Z809 Family history of malignant neoplasm, unspecified: Secondary | ICD-10-CM | POA: Insufficient documentation

## 2019-04-19 DIAGNOSIS — R531 Weakness: Secondary | ICD-10-CM

## 2019-04-19 DIAGNOSIS — R51 Headache: Secondary | ICD-10-CM | POA: Diagnosis not present

## 2019-04-19 DIAGNOSIS — R768 Other specified abnormal immunological findings in serum: Secondary | ICD-10-CM

## 2019-04-19 DIAGNOSIS — G894 Chronic pain syndrome: Secondary | ICD-10-CM

## 2019-04-19 DIAGNOSIS — R269 Unspecified abnormalities of gait and mobility: Secondary | ICD-10-CM | POA: Insufficient documentation

## 2019-04-19 DIAGNOSIS — Z79899 Other long term (current) drug therapy: Secondary | ICD-10-CM | POA: Diagnosis not present

## 2019-04-19 DIAGNOSIS — B1920 Unspecified viral hepatitis C without hepatic coma: Secondary | ICD-10-CM | POA: Insufficient documentation

## 2019-04-19 DIAGNOSIS — F329 Major depressive disorder, single episode, unspecified: Secondary | ICD-10-CM | POA: Diagnosis not present

## 2019-04-19 DIAGNOSIS — B1910 Unspecified viral hepatitis B without hepatic coma: Secondary | ICD-10-CM

## 2019-04-19 DIAGNOSIS — R262 Difficulty in walking, not elsewhere classified: Secondary | ICD-10-CM

## 2019-04-19 DIAGNOSIS — R200 Anesthesia of skin: Secondary | ICD-10-CM | POA: Diagnosis not present

## 2019-04-19 DIAGNOSIS — M1990 Unspecified osteoarthritis, unspecified site: Secondary | ICD-10-CM | POA: Insufficient documentation

## 2019-04-19 DIAGNOSIS — Z87891 Personal history of nicotine dependence: Secondary | ICD-10-CM

## 2019-04-19 DIAGNOSIS — Z7289 Other problems related to lifestyle: Secondary | ICD-10-CM | POA: Diagnosis not present

## 2019-04-19 DIAGNOSIS — Z833 Family history of diabetes mellitus: Secondary | ICD-10-CM | POA: Diagnosis not present

## 2019-04-19 DIAGNOSIS — Z86718 Personal history of other venous thrombosis and embolism: Secondary | ICD-10-CM | POA: Insufficient documentation

## 2019-04-19 LAB — COMPREHENSIVE METABOLIC PANEL
ALT: 13 U/L (ref 0–44)
AST: 13 U/L — ABNORMAL LOW (ref 15–41)
Albumin: 3.8 g/dL (ref 3.5–5.0)
Alkaline Phosphatase: 123 U/L (ref 38–126)
Anion gap: 8 (ref 5–15)
BUN: 15 mg/dL (ref 6–20)
CO2: 20 mmol/L — ABNORMAL LOW (ref 22–32)
Calcium: 8.5 mg/dL — ABNORMAL LOW (ref 8.9–10.3)
Chloride: 112 mmol/L — ABNORMAL HIGH (ref 98–111)
Creatinine, Ser: 0.79 mg/dL (ref 0.61–1.24)
GFR calc Af Amer: >60 mL/min (ref >60)
GFR calc non Af Amer: >60 mL/min (ref >60)
Glucose, Bld: 94 mg/dL (ref 70–99)
Potassium: 3.1 mmol/L — ABNORMAL LOW (ref 3.5–5.1)
Sodium: 140 mmol/L (ref 135–145)
Total Bilirubin: 0.3 mg/dL (ref 0.3–1.2)
Total Protein: 7.1 g/dL (ref 6.5–8.1)

## 2019-04-19 LAB — CBC WITH DIFFERENTIAL/PLATELET
Abs Immature Granulocytes: 0.04 10*3/uL (ref 0.00–0.07)
Basophils Absolute: 0 10*3/uL (ref 0.0–0.1)
Basophils Relative: 1 %
Eosinophils Absolute: 0.2 10*3/uL (ref 0.0–0.5)
Eosinophils Relative: 3 %
HCT: 42.2 % (ref 39.0–52.0)
Hemoglobin: 13.6 g/dL (ref 13.0–17.0)
Immature Granulocytes: 1 %
Lymphocytes Relative: 28 %
Lymphs Abs: 1.7 10*3/uL (ref 0.7–4.0)
MCH: 27.9 pg (ref 26.0–34.0)
MCHC: 32.2 g/dL (ref 30.0–36.0)
MCV: 86.7 fL (ref 80.0–100.0)
Monocytes Absolute: 0.5 10*3/uL (ref 0.1–1.0)
Monocytes Relative: 9 %
Neutro Abs: 3.5 10*3/uL (ref 1.7–7.7)
Neutrophils Relative %: 58 %
Platelets: 224 10*3/uL (ref 150–400)
RBC: 4.87 MIL/uL (ref 4.22–5.81)
RDW: 15.6 % — ABNORMAL HIGH (ref 11.5–15.5)
WBC: 5.9 10*3/uL (ref 4.0–10.5)
nRBC: 0 % (ref 0.0–0.2)

## 2019-04-19 MED ORDER — HEPARIN SOD (PORK) LOCK FLUSH 100 UNIT/ML IV SOLN
INTRAVENOUS | Status: AC
  Filled 2019-04-19: qty 5

## 2019-04-19 MED ORDER — HEPARIN SOD (PORK) LOCK FLUSH 100 UNIT/ML IV SOLN
500.0000 [IU] | Freq: Once | INTRAVENOUS | Status: AC
  Administered 2019-04-19: 10:00:00 500 [IU] via INTRAVENOUS

## 2019-04-19 MED ORDER — SODIUM CHLORIDE 0.9% FLUSH
10.0000 mL | INTRAVENOUS | Status: DC | PRN
  Administered 2019-04-19: 08:00:00 10 mL via INTRAVENOUS
  Filled 2019-04-19: qty 10

## 2019-04-19 NOTE — Progress Notes (Signed)
Patient disappointed, upset that he cannot get his Rituxan treatment today. Deaccessed port. Called Transportation for pick up.

## 2019-04-19 NOTE — Progress Notes (Signed)
Patient c/o increase pain noted to the back of his head, bilateral shoulders and arms. Constantly.  Pain level today 8

## 2019-04-19 NOTE — Telephone Encounter (Signed)
Made PCP office aware of Potassium results. Informed them results were sent to PCP earlier today to review. Message to be sent to nurse by receptionist.

## 2019-04-19 NOTE — Telephone Encounter (Signed)
Informed patient of low potassium level. Advised PCP was made aware and to reach out to PCP officce if he does not hear back regarding potassium. Educated patient on Potassium rich foods. Pt. Verbalizes understanding and denies any further questions.

## 2019-04-19 NOTE — Telephone Encounter (Signed)
-----   Message from Melissa C Corcoran, MD sent at 04/19/2019  1:12 PM EDT ----- Regarding: Please call patient and PCP  Potassium is 3.1 (low).  He is on HCTZ.  He needs supplementation.  M ----- Message ----- From: Interface, Lab In [place] Sent: 04/19/2019   8:36 AM EDT To: Melissa C Corcoran, MD   

## 2019-04-19 NOTE — Telephone Encounter (Signed)
-----   Message from Lequita Asal, MD sent at 04/19/2019  1:12 PM EDT ----- Regarding: Please call patient and PCP  Potassium is 3.1 (low).  He is on HCTZ.  He needs supplementation.  M ----- Message ----- From: Buel Ream, Lab In [place] Sent: 04/19/2019   8:36 AM EDT To: Lequita Asal, MD

## 2019-04-20 LAB — MISC LABCORP TEST (SEND OUT): Labcorp test code: 96917

## 2019-04-20 LAB — HEPATITIS B CORE ANTIBODY, TOTAL: Hep B Core Total Ab: POSITIVE — AB

## 2019-04-20 LAB — HEPATITIS B SURFACE ANTIGEN: Hepatitis B Surface Ag: NEGATIVE

## 2019-04-20 LAB — HEPATITIS C ANTIBODY: HCV Ab: >11 s/co ratio — ABNORMAL HIGH (ref 0.0–0.9)

## 2019-04-21 LAB — HEPATITIS B DNA, ULTRAQUANTITATIVE, PCR
HBV DNA SERPL PCR-ACNC: NOT DETECTED IU/mL
HBV DNA SERPL PCR-LOG IU: UNDETERMINED log10 IU/mL

## 2019-04-22 LAB — QUANTIFERON-TB GOLD PLUS (RQFGPL)
QuantiFERON Mitogen Value: >10 IU/mL
QuantiFERON Nil Value: 0.57 IU/mL
QuantiFERON TB1 Ag Value: 3.56 IU/mL
QuantiFERON TB2 Ag Value: 3.37 IU/mL

## 2019-04-22 LAB — QUANTIFERON-TB GOLD PLUS: QuantiFERON-TB Gold Plus: POSITIVE — AB

## 2019-04-25 ENCOUNTER — Telehealth: Payer: Self-pay

## 2019-04-25 NOTE — Telephone Encounter (Signed)
Faxed labs results to PCP office, dr Jacqulynn Cadet office / neurology, and to the health department.The health department states she will reach out to the patientSharyn Lull)  I spoke with his PCP office to inform them that he was positive for TB. I also asked if they had the facility on file that the patient was residing in they was able to provide me with a phone number but i was not able to get a answer. I did reach out to the patient and he would not tell me where he was located / he was very disrespectful to me and disagreed to tell me anything.

## 2019-04-25 NOTE — Telephone Encounter (Signed)
I spoke with the patient and he wanted me to give him his lab result. I told him I couldn't give him his result without Dr Mike Gip permission. The patient became very upset and begin to states that we were racist and began verbally abuse to me I told the patient to have a great day and left the phone. I have also informed Dr Mike Gip.  Ms Shirlean Mylar does report that the patient called back again and was verbally abuse on the voice mail.

## 2019-04-29 DIAGNOSIS — R768 Other specified abnormal immunological findings in serum: Secondary | ICD-10-CM | POA: Insufficient documentation

## 2019-05-09 ENCOUNTER — Telehealth: Payer: Self-pay | Admitting: *Deleted

## 2019-05-09 NOTE — Telephone Encounter (Signed)
Ms. Ouida Sills from [facility] calling for records of latent tuberculosis treatment. RN printed these, will send records once we get an official release request faxed to [phone]. Landis Gandy, RN

## 2019-05-13 IMAGING — CR DG CHEST 2V
2 series · 2 of 2 positions shown · non-contrast
Comparison: 10/03/2017

CLINICAL DATA: Pt presents to the ed for a MS flare up x 3 weeks
states that he went to his doctor who told him that if he wasn't
feeling better with the oral steroids they gave him then he needed
to come to the ED. Complaining of pain in his arms, legs and a
headache.

EXAM:
CHEST  2 VIEW

[chest lat]
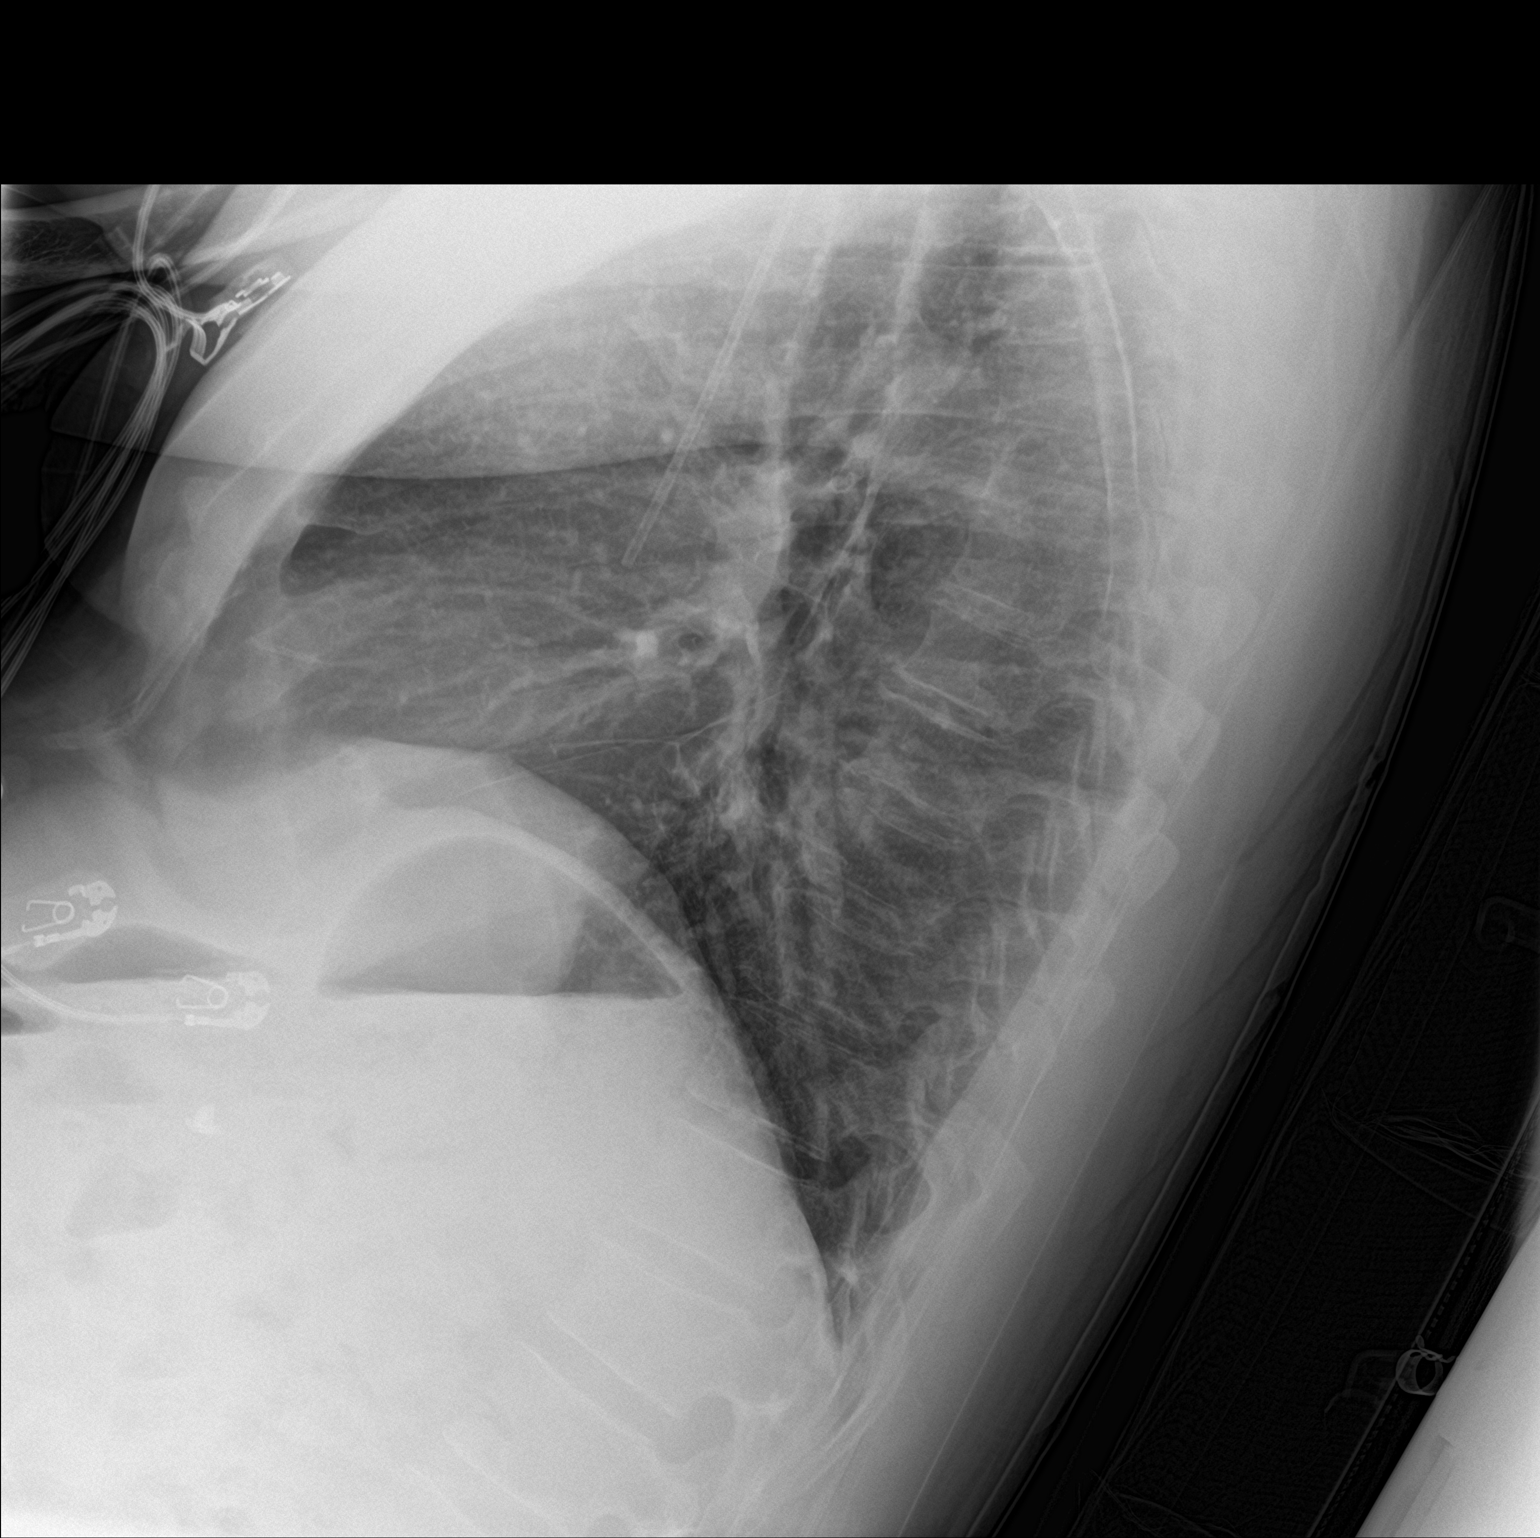

[chest ap]
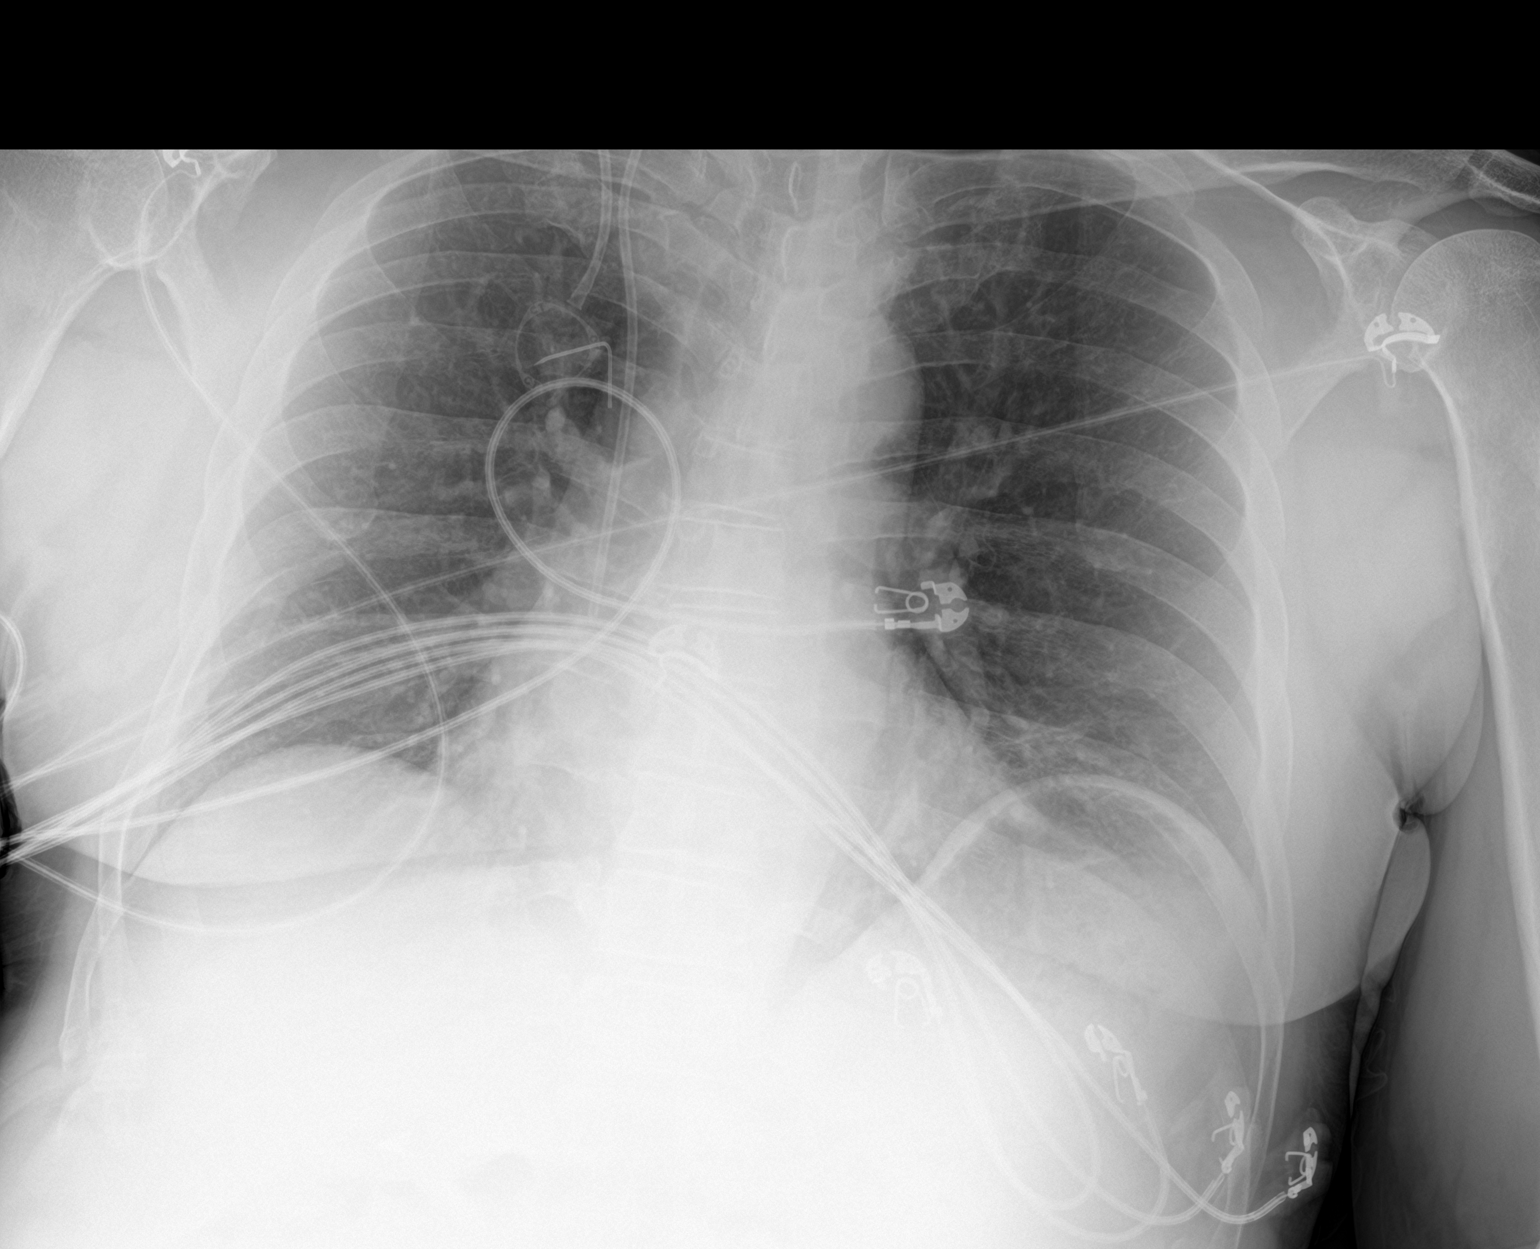

[2 of 2 positions shown; findings below may reference images not displayed]

FINDINGS: Cardiac silhouette is normal in size. No mediastinal or hilar
masses. No convincing adenopathy. Clear lungs. No pleural effusion
or pneumothorax.

Right anterior chest wall, internal jugular, Port-A-Cath has its tip
at the caval atrial junction, unchanged.

Skeletal structures are intact.
IMPRESSION: No active cardiopulmonary disease.

## 2019-05-14 IMAGING — MR MR HEAD WO/W CM
8 of 13 series · 29 of 48 positions shown · IV contrast (multihance)
Comparison: 08/06/2017 MRI of the head.

CLINICAL DATA: 57 y/o M; history of multiple sclerosis with flare
of symptoms for 3 weeks. Pain in arms, legs, and headache.

EXAM:
MRI HEAD WITHOUT AND WITH CONTRAST
TECHNIQUE: Multiplanar, multiecho pulse sequences of the brain and surrounding
structures were obtained without and with intravenous contrast.
CONTRAST:  16mL MULTIHANCE GADOBENATE DIMEGLUMINE 529 MG/ML IV SOLN

[Series 2: FLAIR · sagittal · 5.0mm · 0.47mm/px · 1 of 23 slices shown (1 of 3)]
[im 1/23]
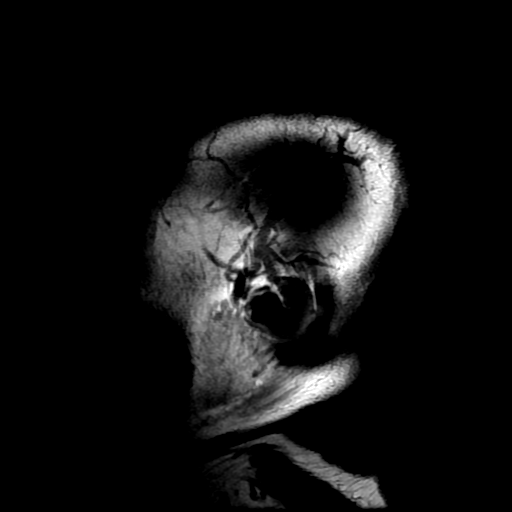

[Series 4: DWI · axial · 3.0mm · 0.94mm/px · z∈[-51,+94]mm · 6 of 100 slices shown (1 of 2)]
[im 1/100]
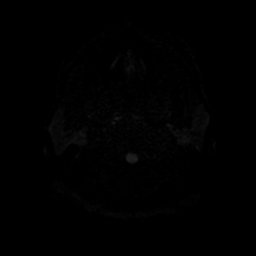
[im 20/100]
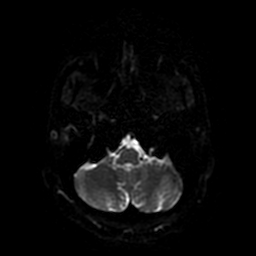
[im 40/100]
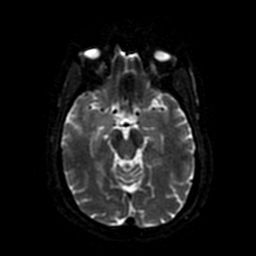
[im 60/100]
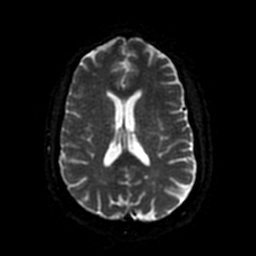
[im 80/100]
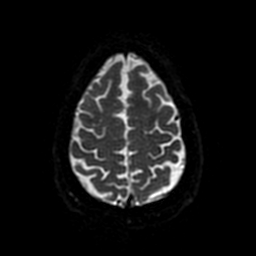
[im 100/100]
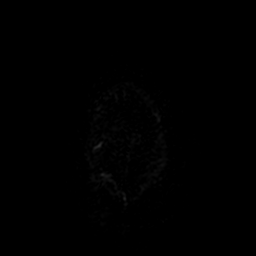

[Series 5: T2 · axial · 5.0mm · 0.43mm/px · z∈[-43,+106]mm · 2 of 26 slices shown]
[im 1/26]
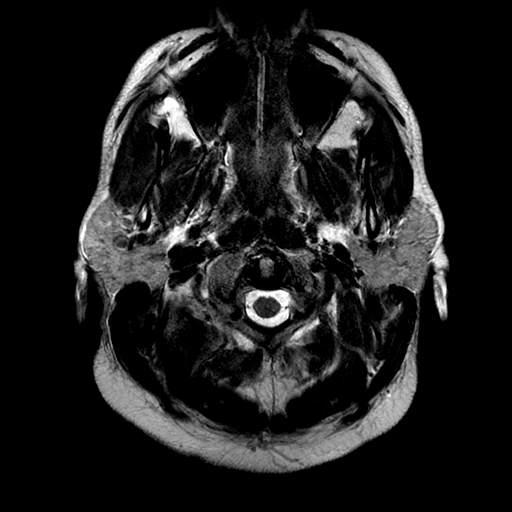
[im 26/26]
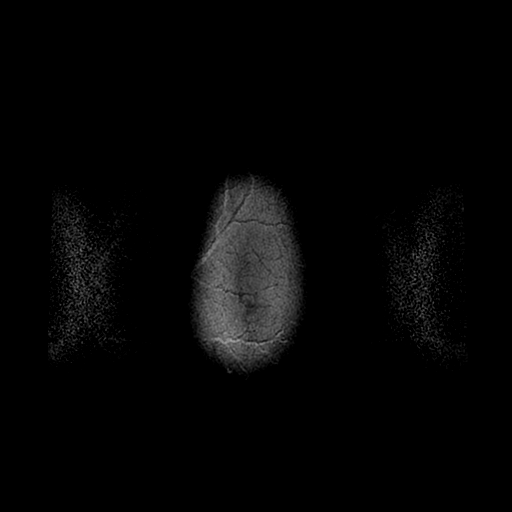

[Series 6: FLAIR · axial · 3.0mm · 0.43mm/px · z∈[-43,+106]mm · 2 of 26 slices shown (2 of 3)]
[im 1/26]
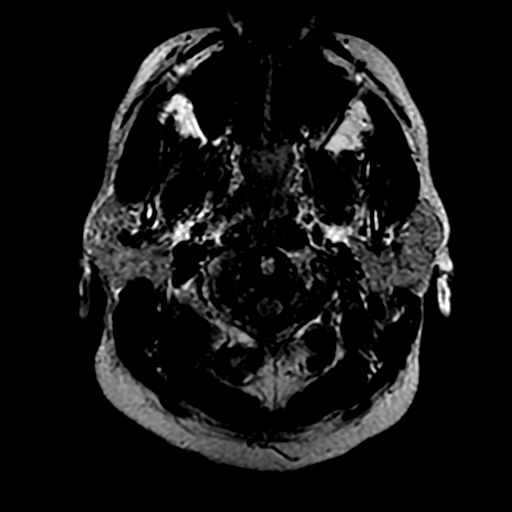
[im 26/26]
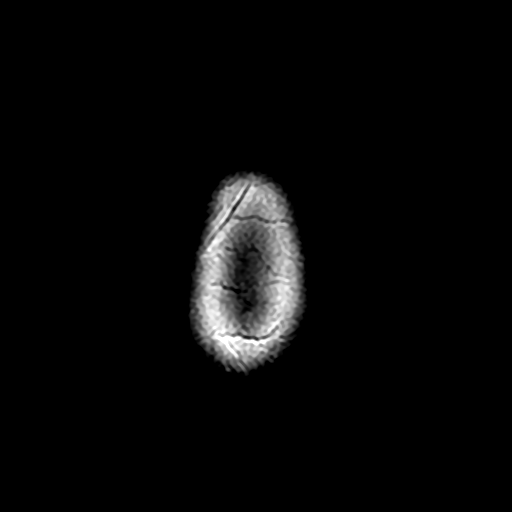

[Series 7: FLAIR · sagittal · 1.6mm · 0.49mm/px · 9 of 208 slices shown (3 of 3)]
[im 1/208]
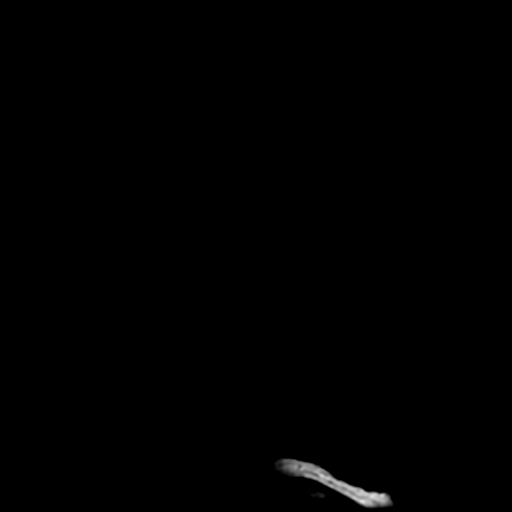
[im 38/208]
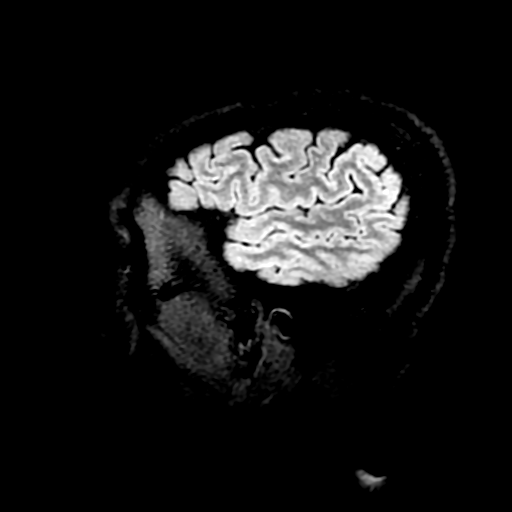
[im 57/208]
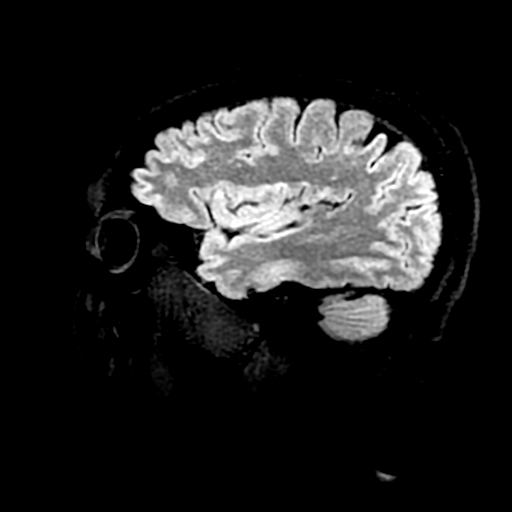
[im 95/208]
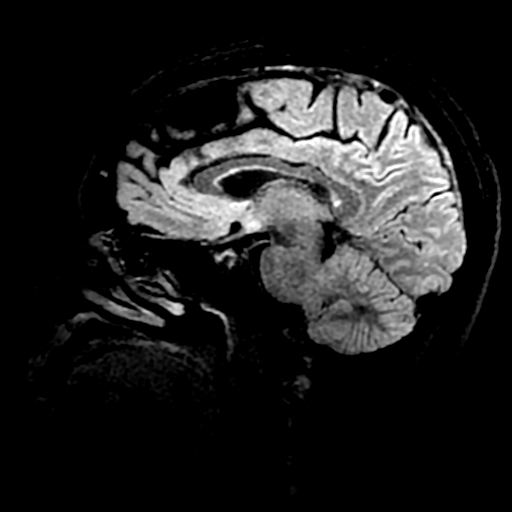
[im 113/208]
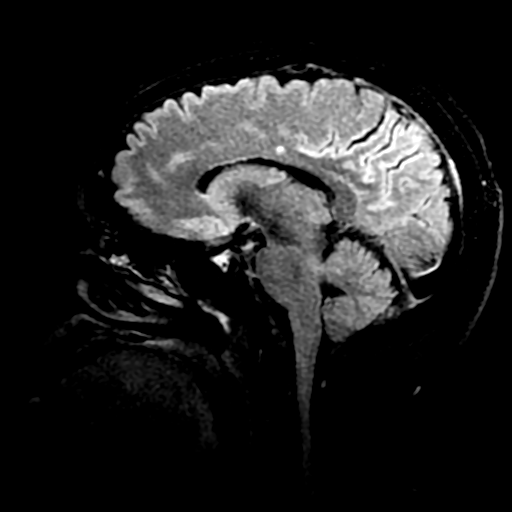
[im 151/208]
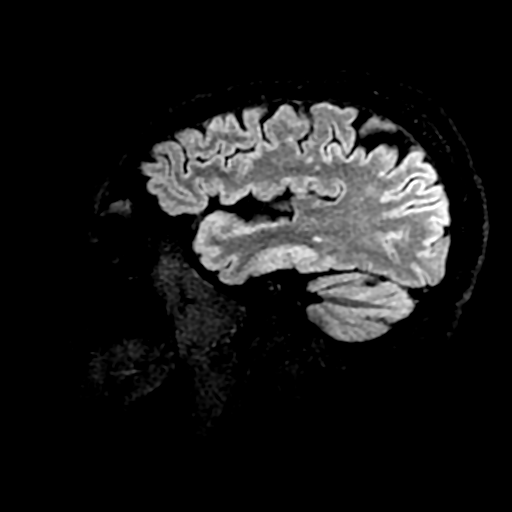
[im 170/208]
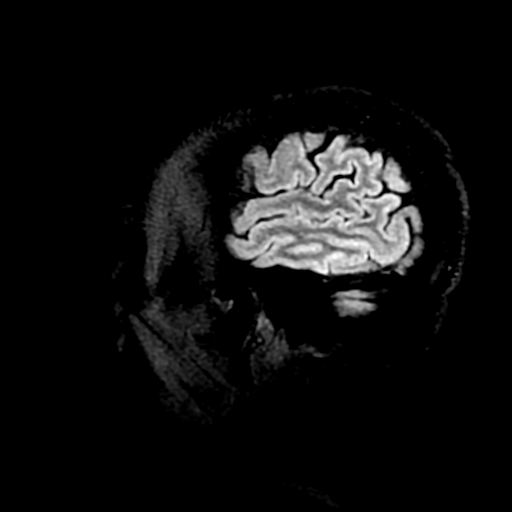
[im 189/208]
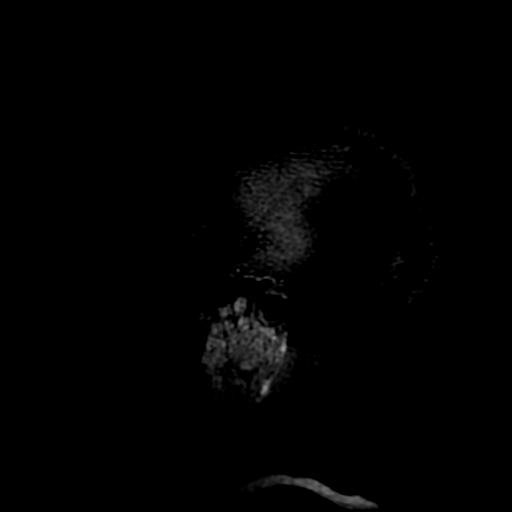
[im 208/208]
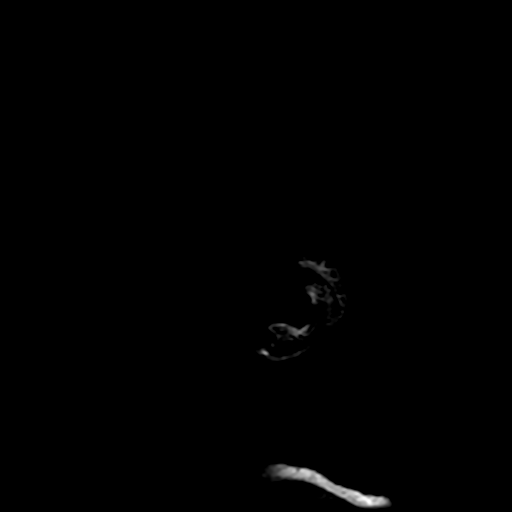

[Series 8: DWI · coronal · 5.0mm · 0.94mm/px · 4 of 72 slices shown (2 of 2)]
[im 1/72]
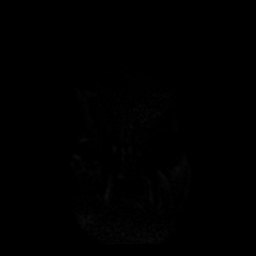
[im 24/72]
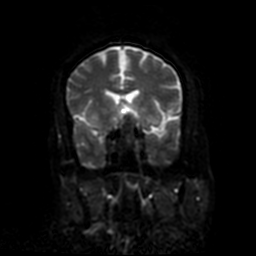
[im 48/72]
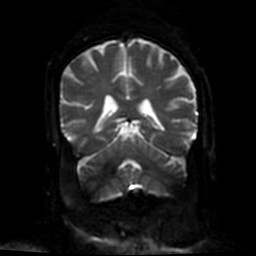
[im 72/72]
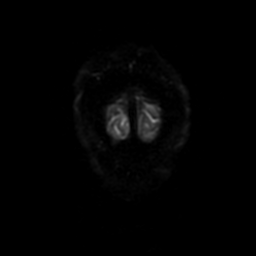

[Series 450: ADC · axial · 3.0mm · 0.94mm/px · z∈[-51,+94]mm · 3 of 47 slices shown (1 of 2)]
[im 1/47]
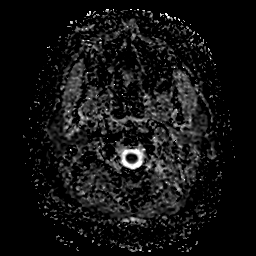
[im 24/47]
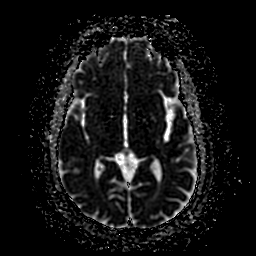
[im 47/47]
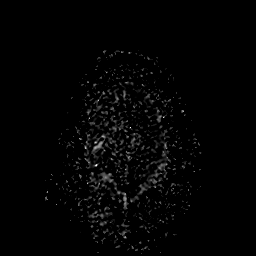

[Series 850: ADC · coronal · 5.0mm · 0.94mm/px · 2 of 36 slices shown (2 of 2)]
[im 1/36]
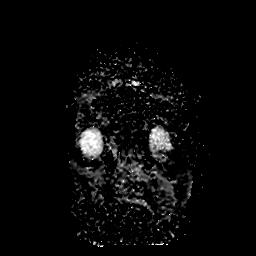
[im 36/36]
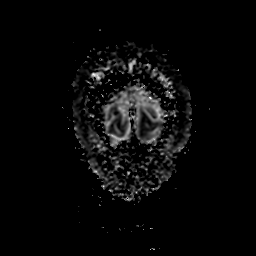

[29 of 48 positions shown; findings below may reference images not displayed]

FINDINGS: Brain: Numerous stable small T2 FLAIR hyperintense lesions are
present within periventricular and subcortical white matter of the
supratentorial brain with at least 1 juxta cortical lesion in the
right lateral frontal lobe (series 7, image 177). Stable lesions are
present within the right brachium pontis and right cerebral peduncle
(series 7, image 124). No reduced diffusion or abnormal enhancement.
No abnormal susceptibility hypointensity. No hydrocephalus,
extra-axial collection, or effacement of basilar cisterns.

Vascular: Normal flow voids.

Skull and upper cervical spine: Normal marrow signal.

Sinuses/Orbits: Negative.

Other: None.
IMPRESSION: Stable white matter lesions in juxta cortical, subcortical, and
posterior fossa white matter compatible with sequelae of
demyelination and multiple sclerosis. No enhancement or reduced
diffusion to suggest an active process.

By: Chan Kit Yono M.D.

## 2019-05-14 IMAGING — MR MR CERVICAL SPINE WO/W CM
4 of 9 series · 19 of 48 positions shown · IV contrast (multihance)
Comparison: 08/06/2017 MRI of the cervical spine.

CLINICAL DATA: 57 y/o M; history of multiple sclerosis with flare
up over the last 3 weeks.

EXAM:
MRI CERVICAL SPINE WITHOUT AND WITH CONTRAST
TECHNIQUE: Multiplanar and multiecho pulse sequences of the cervical spine, to
include the craniocervical junction and cervicothoracic junction,
were obtained without and with intravenous contrast.
CONTRAST:  16mL MULTIHANCE GADOBENATE DIMEGLUMINE 529 MG/ML IV SOLN

[Series 15: T2 · sagittal · 3.0mm · 0.43mm/px · 3 of 15 slices shown (1 of 2)]
[im 1/15]
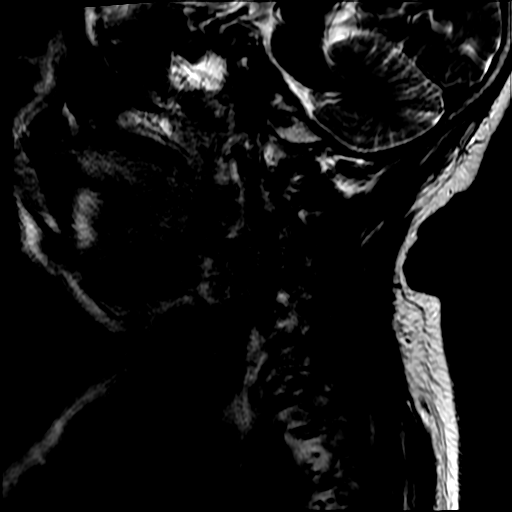
[im 8/15]
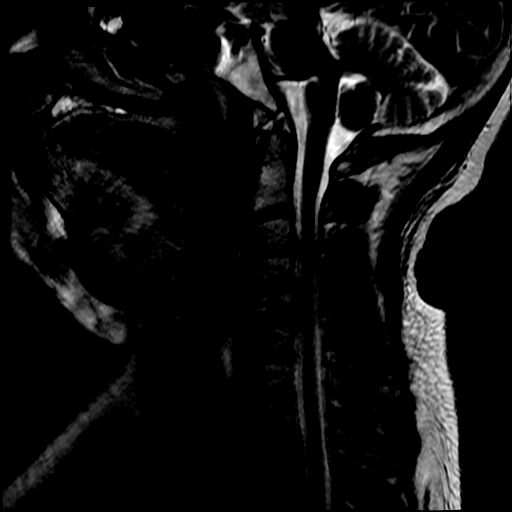
[im 15/15]
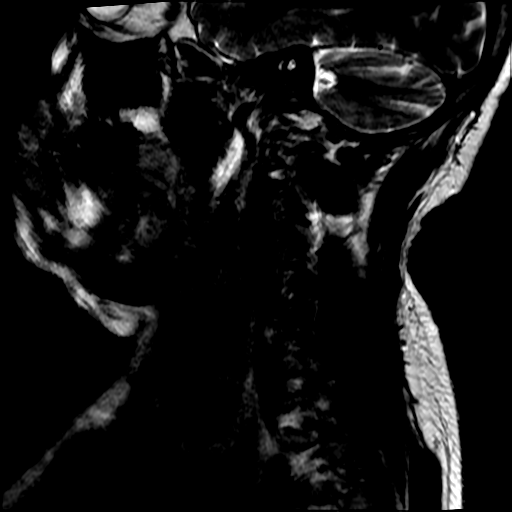

[Series 19: T2 · axial · 3.0mm · 0.35mm/px · z∈[-174,-84]mm · 7 of 29 slices shown (2 of 2)]
[im 1/29]
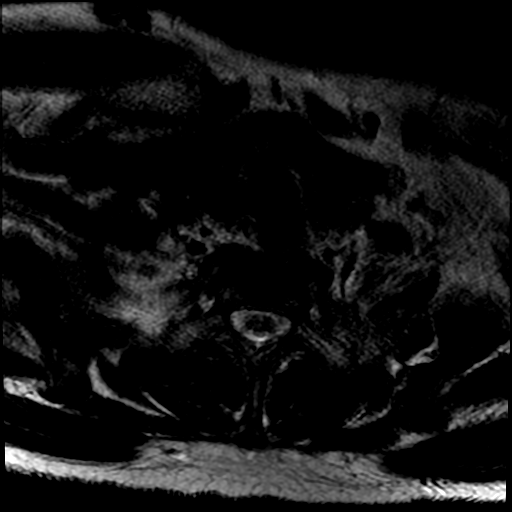
[im 5/29]
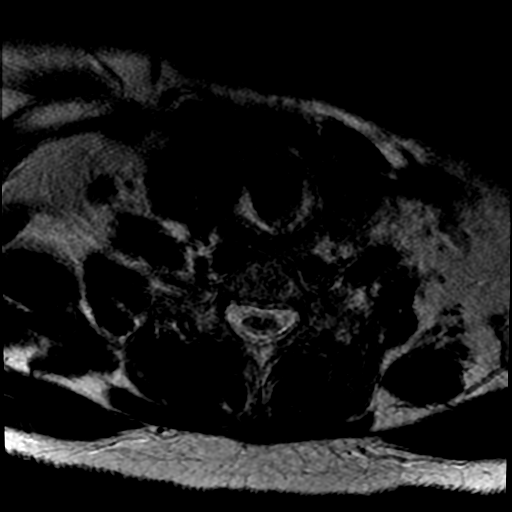
[im 10/29]
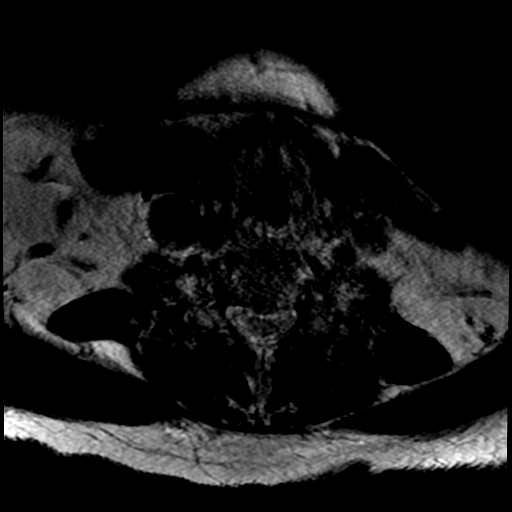
[im 15/29]
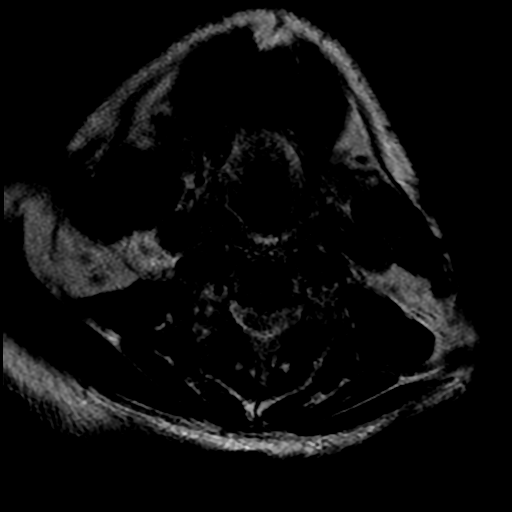
[im 19/29]
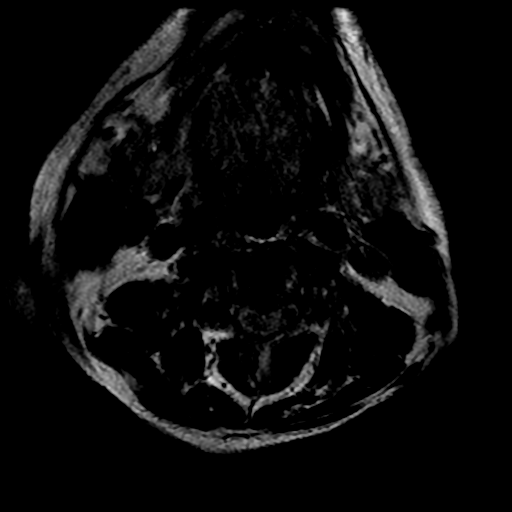
[im 24/29]
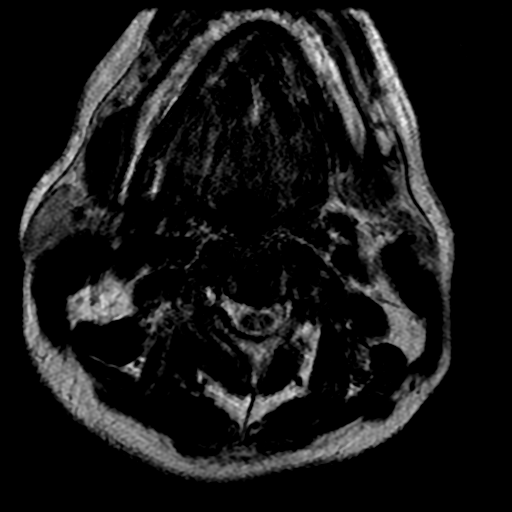
[im 29/29]
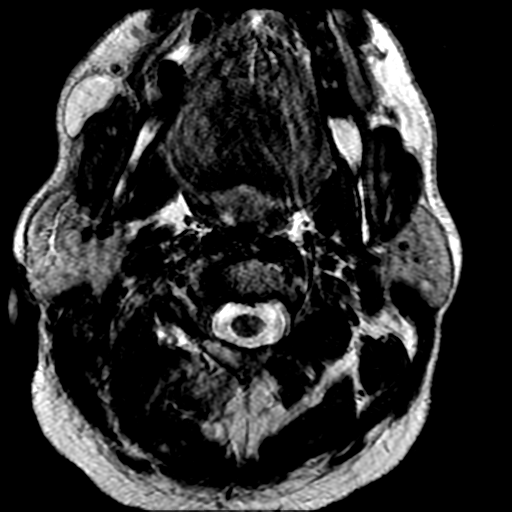

[Series 20: T1 · axial · non-contrast · 3.0mm · 0.35mm/px · z∈[-174,-100]mm · 6 of 29 slices shown]
[im 1/29]
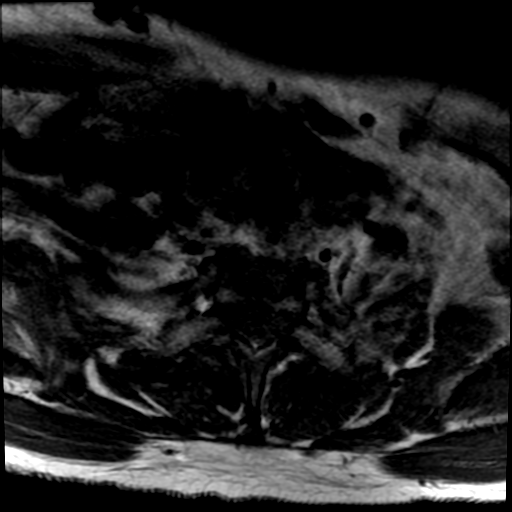
[im 5/29]
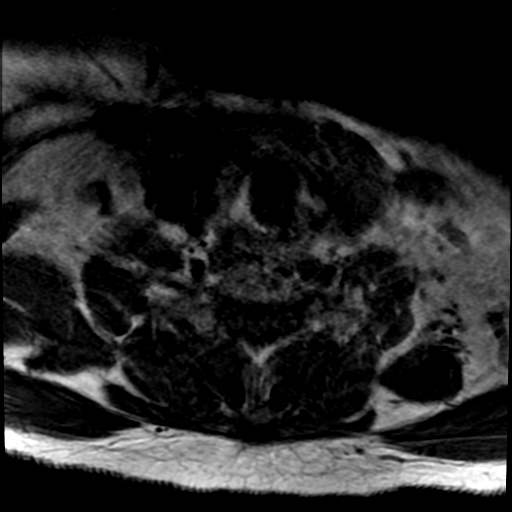
[im 10/29]
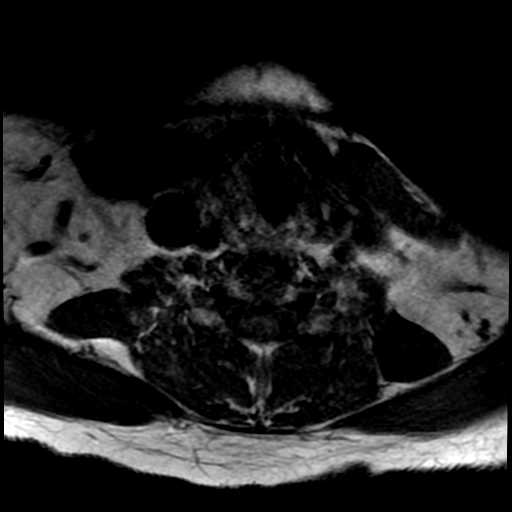
[im 15/29]
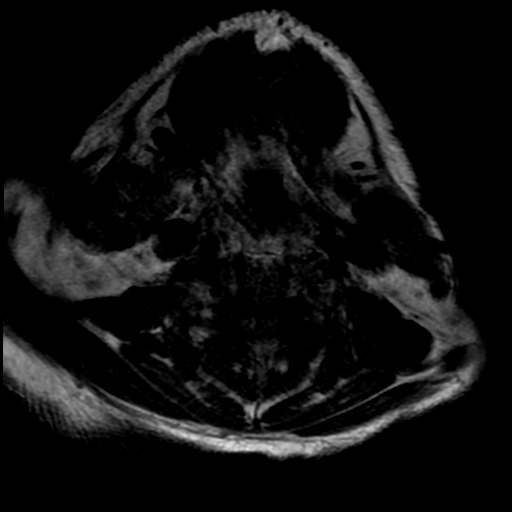
[im 19/29]
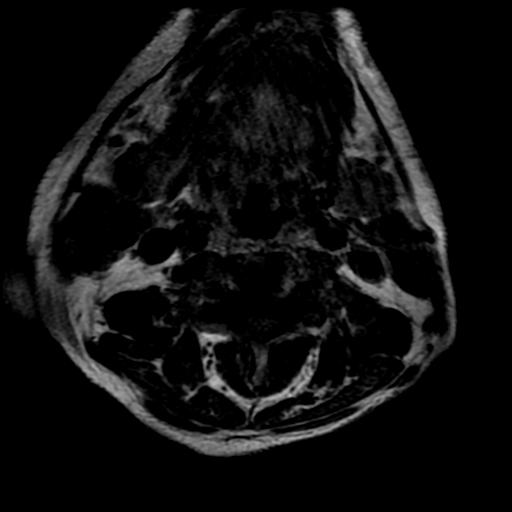
[im 24/29]
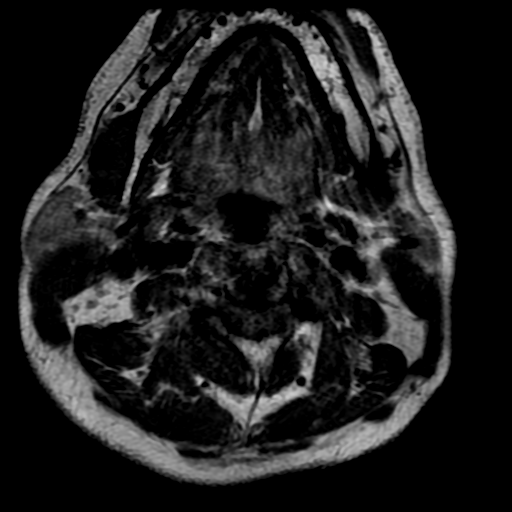

[Series 22: T1 fat-sat post-contrast · sagittal · 3.0mm · 0.43mm/px · 3 of 15 slices shown]
[im 1/15]
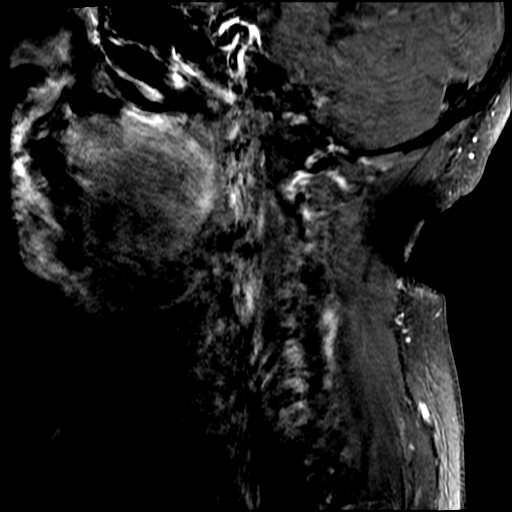
[im 8/15]
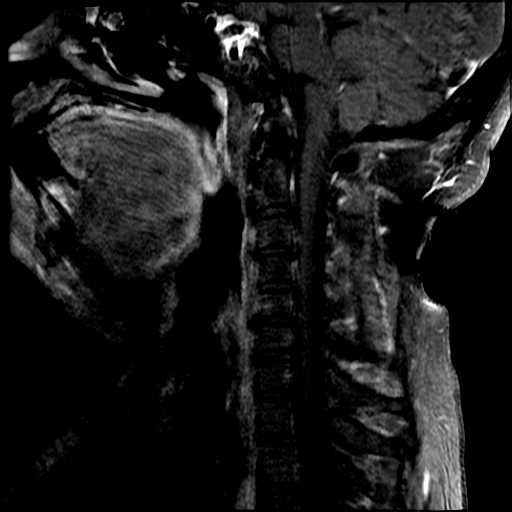
[im 15/15]
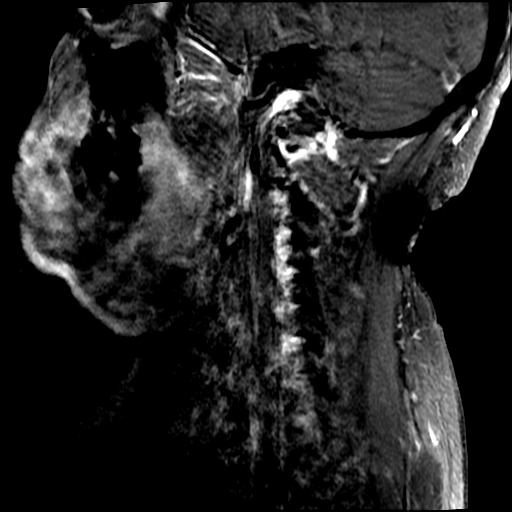

[19 of 48 positions shown; findings below may reference images not displayed]

FINDINGS: Severe motion degradation.

Alignment: Straightening of cervical lordosis without listhesis.

Vertebrae: No fracture, evidence of discitis, or bone lesion.

Cord: Dorsal C2-3, left dorsal C4-5, right dorsal C6, ventral C6-7
are faintly visualized and grossly stable from prior MRI. No new
cord lesion or enhancement identified..

Posterior Fossa, vertebral arteries, paraspinal tissues: Negative.

Disc levels:

Upper cervical facet hypertrophy and discogenic degenerative changes
from C3 through C6 levels are stable. Mild C3-4 and C5-6 canal
stenosis. Multilevel foraminal stenosis greatest at C3-4 level where
it is moderate to severe. No significant change and cervical
spondylosis from prior study.
IMPRESSION: 1. Severe motion degradation.
2. Multiple cervical cord lesions are grossly stable from prior
cervical MRI. No definite new lesion or enhancement.
3. Stable cervical spondylosis with mild C3-4 and C5-6 canal
stenosis.

By: Johanny Flash M.D.

## 2019-05-15 IMAGING — CT CT HEAD W/O CM
4 series · 17 of 47 positions shown, 19 images · non-contrast
Comparison: Head CT 05/07/2017

CLINICAL DATA: Fall

EXAM:
CT HEAD WITHOUT CONTRAST
TECHNIQUE: Contiguous axial images were obtained from the base of the skull
through the vertex without intravenous contrast.

[Series 3: head without · axial · non-contrast · 0.44mm/px · z∈[+1168,+1293]mm · 7 of 35 slices shown, 9 images]
[im 5/35  brain]
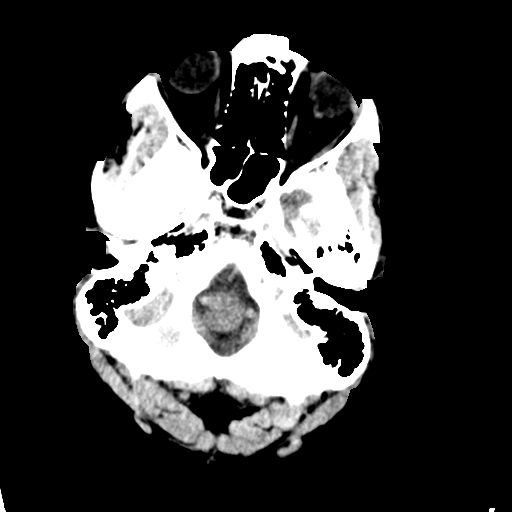
[im 5/35  bone]
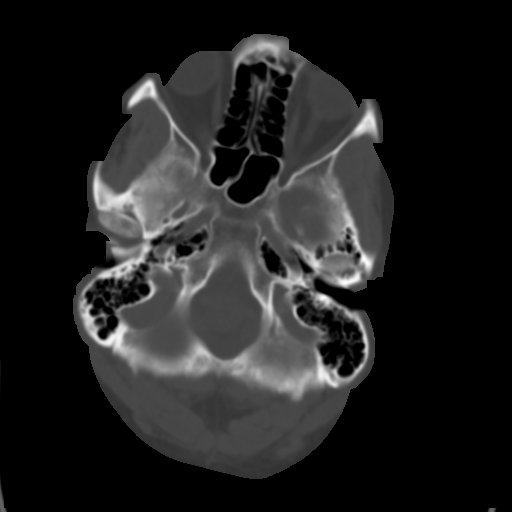
[im 9/35  brain]
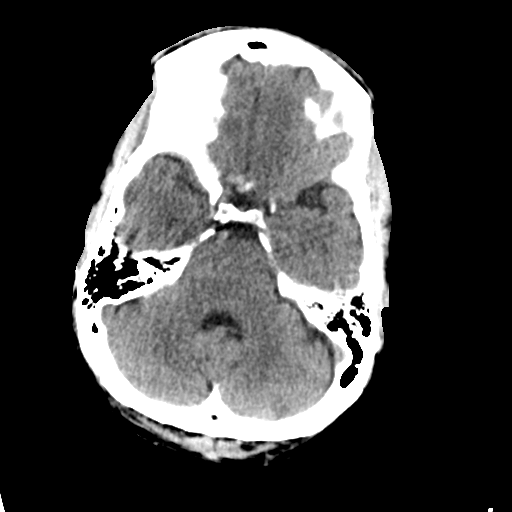
[im 13/35  brain]
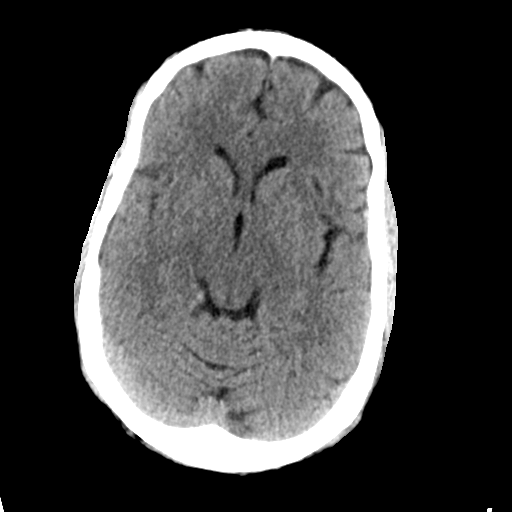
[im 18/35  brain]
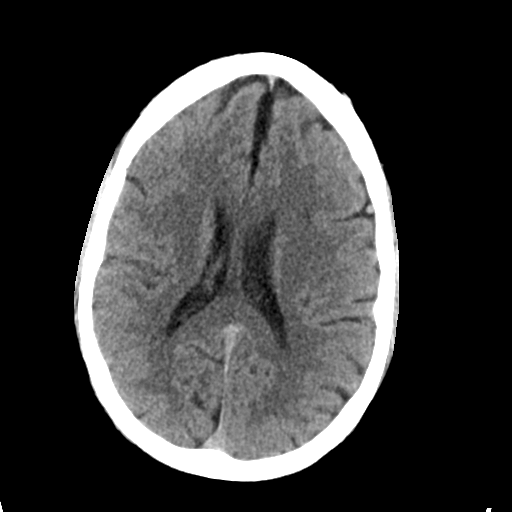
[im 22/35  brain]
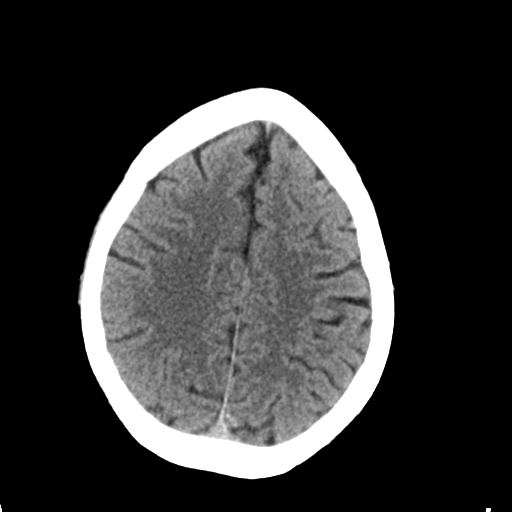
[im 22/35  bone]
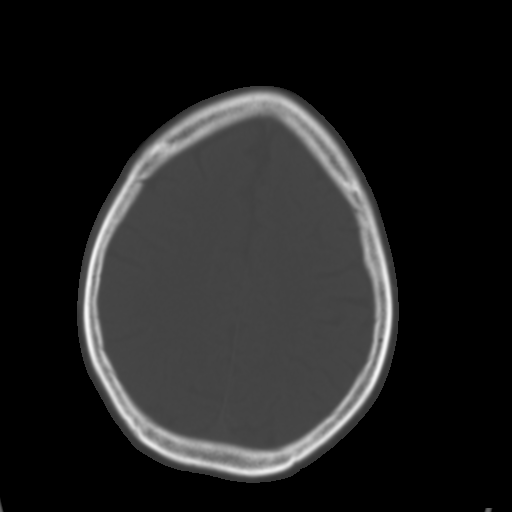
[im 26/35  brain]
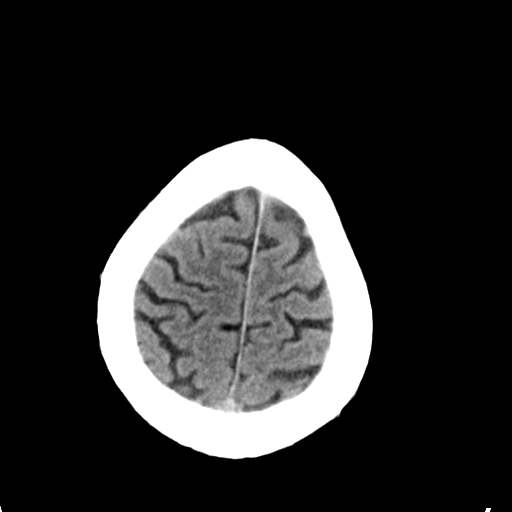
[im 30/35  brain]
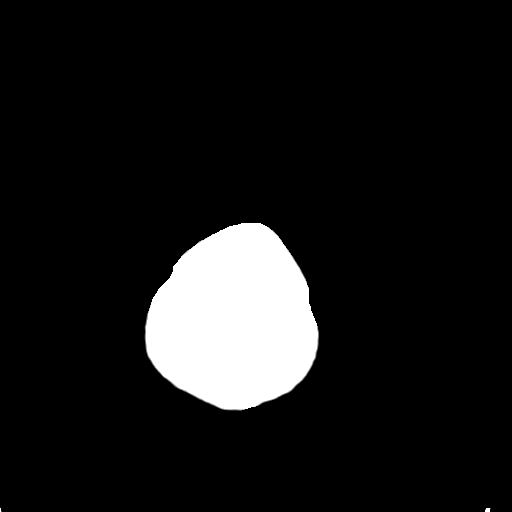

[Series 4: head bone · axial · 0.44mm/px · z∈[+1164,+1224]mm · 4 of 86 slices shown]
[im 9/86  bone]
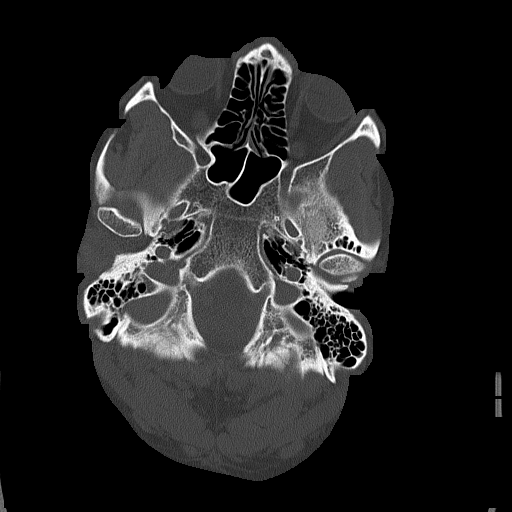
[im 18/86  bone]
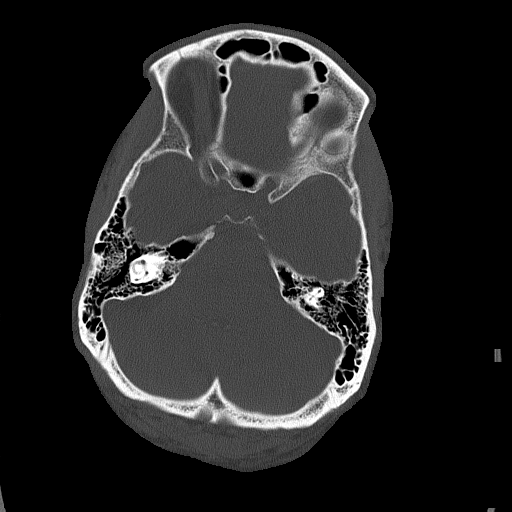
[im 26/86  bone]
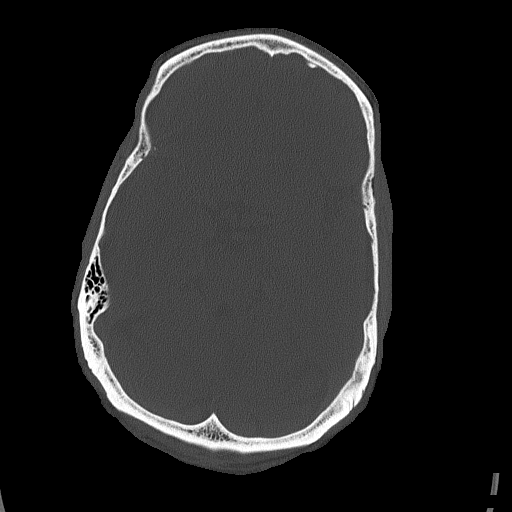
[im 39/86  bone]
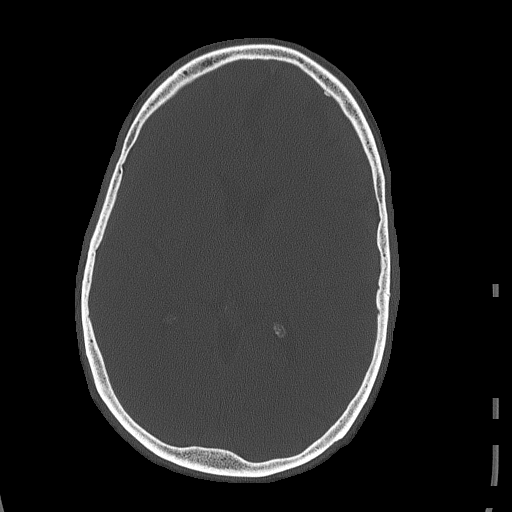

[Series 5: head without cor · coronal · non-contrast · 0.33mm/px · 3 of 70 slices shown]
[im 24/70  brain]
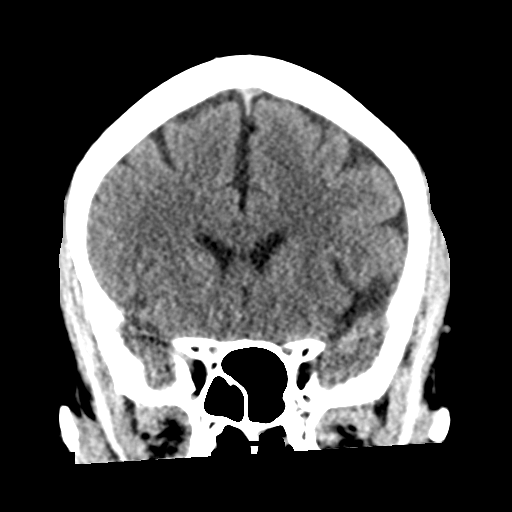
[im 31/70  brain]
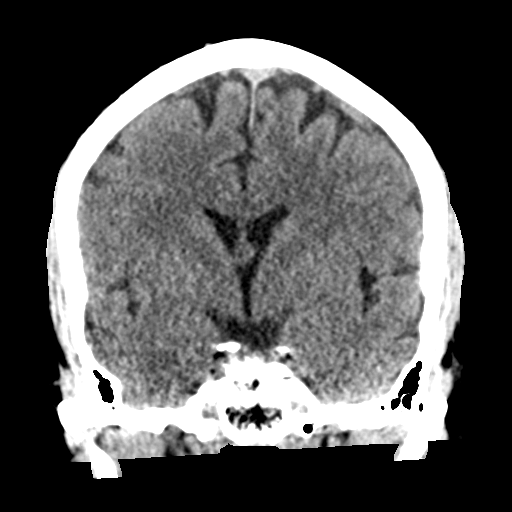
[im 39/70  brain]
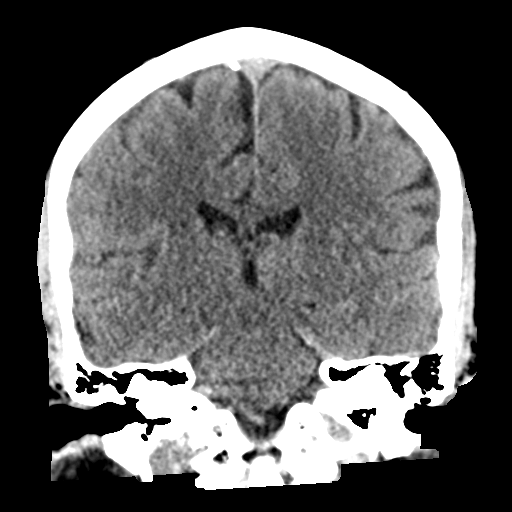

[Series 6: head without sag · sagittal · non-contrast · 0.33mm/px · 3 of 67 slices shown]
[im 23/67  brain]
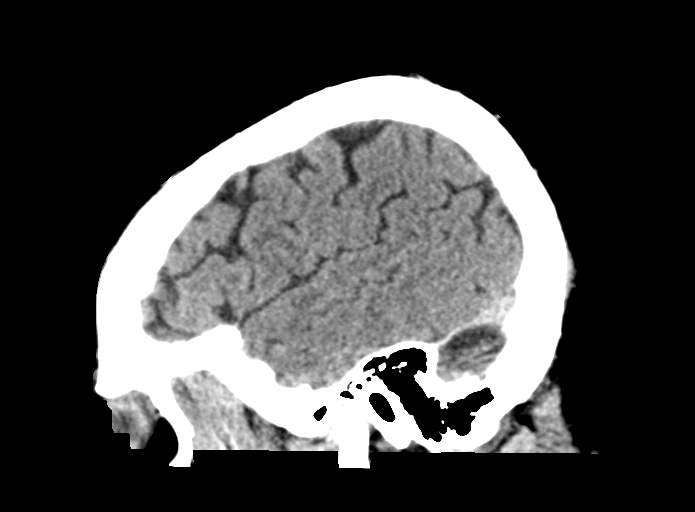
[im 34/67  brain]
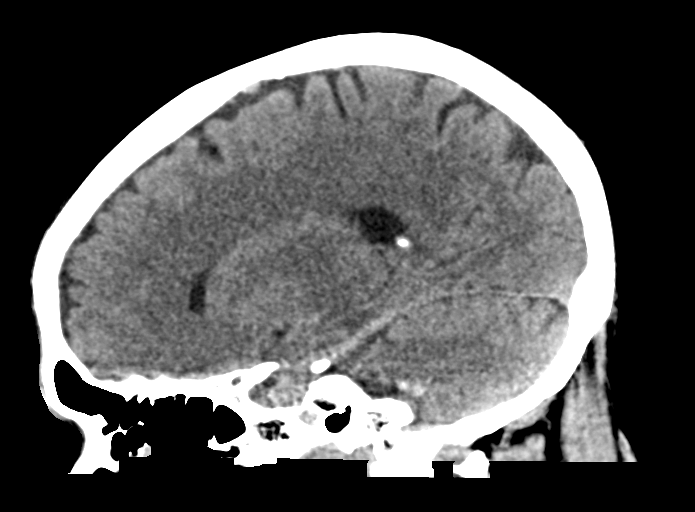
[im 44/67  brain]
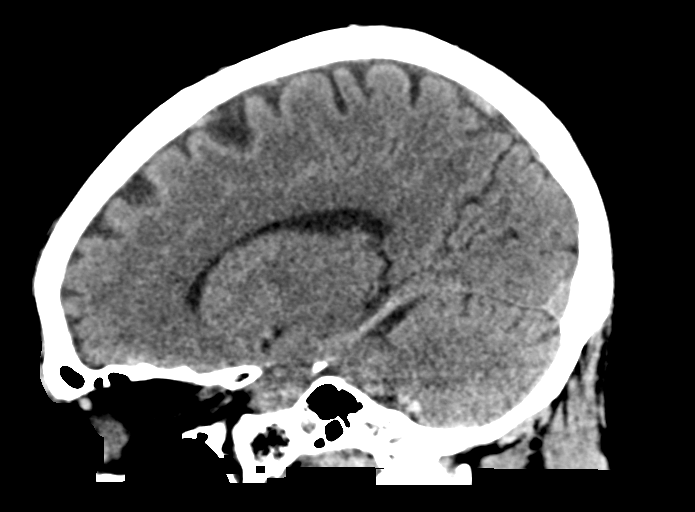

[17 of 47 positions shown; findings below may reference images not displayed]

FINDINGS: Brain: No mass lesion, intraparenchymal hemorrhage or extra-axial
collection. No evidence of acute cortical infarct. Brain parenchyma
and CSF-containing spaces are normal for age.

Vascular: No hyperdense vessel or unexpected calcification.

Skull: Normal visualized skull base, calvarium and extracranial soft
tissues.

Sinuses/Orbits: No sinus fluid levels or advanced mucosal
thickening. No mastoid effusion. Normal orbits.
IMPRESSION: Normal head CT.

## 2019-06-27 ENCOUNTER — Ambulatory Visit (INDEPENDENT_AMBULATORY_CARE_PROVIDER_SITE_OTHER): Payer: 59 | Admitting: Internal Medicine

## 2019-06-27 ENCOUNTER — Other Ambulatory Visit: Payer: Self-pay

## 2019-06-27 ENCOUNTER — Encounter: Payer: Self-pay | Admitting: Internal Medicine

## 2019-06-27 VITALS — BP 122/77 | HR 57 | Temp 98.0°F

## 2019-06-27 DIAGNOSIS — B182 Chronic viral hepatitis C: Secondary | ICD-10-CM

## 2019-06-27 NOTE — Progress Notes (Signed)
Patient ID: [name], male   DOB: 09/18/1961, 58 y.o.   MRN: [id number]  HPI He was told by his providers Gary Davieshat his MS infusion is on hold since he has hep C recurrence  [name] has now finished with his 12 wks re-treatment of his hep C with Vosevi  At end of July 2018 (after failing 12 wks of Epclusa). He was last seen by Gary PerezMinh in clinic at [facility] at end of July where his hep C VL was undetectable. He is anxious since he was told that he needs another course of treatment  Outpatient Encounter Medications as of 06/27/2019  Medication Sig  . AMPYRA 10 MG TB12 Take 10 mg by mouth 2 (two) times daily.  . baclofen (LIORESAL) 20 MG tablet Take 1 tablet (20 mg total) by mouth 4 (four) times daily. For muscle spasms (Patient taking differently: Take 20 mg by mouth 4 (four) times daily as needed for muscle spasms. )  . gabapentin (NEURONTIN) 600 MG tablet Take 600 mg by mouth 4 (four) times daily.   . hydrochlorothiazide (HYDRODIURIL) 25 MG tablet Take 25 mg by mouth daily.  . Oxcarbazepine (TRILEPTAL) 300 MG tablet Take 1 tablet (300 mg total) by mouth 3 (three) times daily. For mood stabilization  . oxyCODONE-acetaminophen (PERCOCET) 10-325 MG tablet Take 1 tablet by mouth every 8 (eight) hours as needed for pain.  [name]. permethrin (ELIMITE) 5 % cream Apply 1 application topically daily as needed (skin care).   . risperiDONE (RISPERDAL) 0.25 MG tablet Take 1 tablet (0.25 mg total) by mouth at bedtime.  . riTUXimab (RITUXAN IV) Inject 1 Applicatorful into the vein every 4 (four) months.   . sertraline (ZOLOFT) 50 MG tablet Take 1 tablet (50 mg total) by mouth daily. For depression  . SUMAtriptan (IMITREX) 100 MG tablet Take 100 mg by mouth See admin instructions. Take 100 mg by mouth at onset of headache, may repeat in 2 hours, if needed- not to exceed 2 tablets/24 hrs  . topiramate (TOPAMAX) 100 MG tablet Take 150 mg by mouth at bedtime.   . topiramate (TOPAMAX) 25 MG tablet Take 75 mg by mouth every  morning.   . traMADol (ULTRAM) 50 MG tablet Take 1 tablet (50 mg total) by mouth every 6 (six) hours as needed. (Patient taking differently: Take 50 mg by mouth every 6 (six) hours as needed for moderate pain. )  . traZODone (DESYREL) 100 MG tablet Take 100 mg by mouth at bedtime.   . verapamil (CALAN) 80 MG tablet Take 80 mg by mouth 2 (two) times daily.  [name]. XARELTO 10 MG TABS tablet Take 1 tablet (10 mg total) by mouth daily. For blood clot prevention  . clotrimazole-betamethasone (LOTRISONE) cream Apply 1 application topically 2 (two) times daily as needed (for irritation- AS DIRECTED).   [name]. dicyclomine (BENTYL) 20 MG tablet Take 20 mg by mouth every 6 (six) hours as needed for spasms.   [name]. doxycycline (VIBRAMYCIN) 100 MG capsule Take 100 mg by mouth 2 (two) times daily.  . fluocinonide cream (LIDEX) 0.05 % Apply 1 application topically 2 (two) times daily as needed (to affected areas, as directed- avoid face, groin, and axillae).   . hydrOXYzine (ATARAX/VISTARIL) 25 MG tablet Take 1 tablet (25 mg total) by mouth every 6 (six) hours. (Patient not taking: Reported on 06/27/2019)  . omeprazole (PRILOSEC) 40 MG capsule Take 1 capsule (40 mg total) by mouth every evening. For acid reflux (Patient not taking: Reported on 06/27/2019)  Facility-Administered Encounter Medications as of 06/27/2019  Medication  . sodium chloride 0.9 % injection 10 mL     Patient Active Problem List   Diagnosis Date Noted  . Hepatitis B core antibody positive 04/29/2019  . Chronic right shoulder pain 09/21/2018  . Bilateral primary osteoarthritis of knee 09/21/2018  . Drug overdose 04/08/2018  . Suicide attempt ([place])   . Overdose by acetaminophen, accidental or unintentional, initial encounter 03/18/2018  . Intentional acetaminophen overdose ([place])   . Severe major depression, single episode, with psychotic features ([place]) 10/04/2017  . Weakness   . Cellulitis of right leg 05/07/2017  . Right carpal tunnel syndrome  02/16/2017  . Gait disturbance 01/22/2017  . Insomnia 01/22/2017  . Depression with anxiety 01/22/2017  . High risk medication use 01/22/2017  . Major depressive disorder, recurrent episode ([place]) 12/19/2016  . Suicidal ideation   . AKI (acute kidney injury) ([place]) 11/15/2016  . Chronic hepatitis C virus infection ([place]) 11/15/2016  . Chronic abdominal pain 11/15/2016  . Chronic pain syndrome 11/15/2016  . Acute retention of urine 11/15/2016  . Generalized abdominal pain   . Chronic cluster headache, not intractable   . Community acquired pneumonia   . TB lung, latent   . HCAP (healthcare-associated pneumonia) 02/16/2016  . Hypokalemia 02/16/2016  . Intractable cluster headache syndrome   . DVT (deep venous thrombosis) ([place])   . Depression   . GERD (gastroesophageal reflux disease)   . HTN (hypertension)   . Essential hypertension   . Gastroesophageal reflux disease without esophagitis   . CAP (community acquired pneumonia)   . Multiple sclerosis ([place]) 08/02/2013  . ABDOMINAL BLOATING 10/14/2010  . LOOSE STOOLS 10/14/2010  . PULMONARY EMBOLISM, HX OF 10/14/2010     Health Maintenance Due  Topic Date Due  . TETANUS/TDAP  11/12/1979  . INFLUENZA VACCINE  06/11/2019     Review of Systems Review of Systems  Constitutional: Negative for fever, chills, diaphoresis, activity change, appetite change, fatigue and unexpected weight change.  HENT: Negative for congestion, sore throat, rhinorrhea, sneezing, trouble swallowing and sinus pressure.  Eyes: Negative for photophobia and visual disturbance.  Respiratory: Negative for cough, chest tightness, shortness of breath, wheezing and stridor.  Cardiovascular: Negative for chest pain, palpitations and leg swelling.  Gastrointestinal: Negative for nausea, vomiting, abdominal pain, diarrhea, constipation, blood in stool, abdominal distention and anal bleeding.  Genitourinary: Negative for dysuria, hematuria, flank pain and difficulty  urinating.  Musculoskeletal: Negative for myalgias, back pain, joint swelling, arthralgias and gait problem.  Skin: Negative for color change, pallor, rash and wound.  Neurological: Negative for dizziness, tremors, weakness and light-headedness.  Hematological: Negative for adenopathy. Does not bruise/bleed easily.  Psychiatric/Behavioral: Negative for behavioral problems, confusion, sleep disturbance, dysphoric mood, decreased concentration and agitation.    Physical Exam   BP 122/77   Pulse (!) 57   Temp 98 F (36.7 C)    Physical Exam  Constitutional: He is oriented to person, place, and time. He appears well-developed and well-nourished. No distress. Wheelchair bound at clinic HENT:  Mouth/Throat: Oropharynx is clear and moist. No oropharyngeal exudate.  Cardiovascular: Normal rate, regular rhythm and normal heart sounds. Exam reveals no gallop and no friction rub.  No murmur heard.  Pulmonary/Chest: Effort normal and breath sounds normal. No respiratory distress. He has no wheezes.  Abdominal: Soft. Bowel sounds are normal. He exhibits no distension. There is no tenderness.  Lymphadenopathy:  He has no cervical adenopathy.  Neurological: He is alert and oriented to  person, place, and time.  Skin: Skin is warm and dry. No rash noted. No erythema.  Psychiatric: He has a normal mood and affect. His behavior is normal.    CBC Lab Results  Component Value Date   WBC 5.9 04/19/2019   RBC 4.87 04/19/2019   HGB 13.6 04/19/2019   HCT 42.2 04/19/2019   PLT 224 04/19/2019   MCV 86.7 04/19/2019   MCH 27.9 04/19/2019   MCHC 32.2 04/19/2019   RDW 15.6 (H) 04/19/2019   LYMPHSABS 1.7 04/19/2019   MONOABS 0.5 04/19/2019   EOSABS 0.2 04/19/2019    BMET Lab Results  Component Value Date   NA 140 04/19/2019   K 3.1 (L) 04/19/2019   CL 112 (H) 04/19/2019   CO2 20 (L) 04/19/2019   GLUCOSE 94 04/19/2019   BUN 15 04/19/2019   CREATININE 0.79 04/19/2019   CALCIUM 8.5 (L)  04/19/2019   GFRNONAA >60 04/19/2019   GFRAA >60 04/19/2019      Assessment and Plan  Chronic hep C treated = we don't have any labs at this visit to suggests that he has had treatment failure or relapse/reinfection. We will check hep C viral laod. Mentioned to the patient that his hep C antibody test will always be positive   Addendum: hep C viral load is undetectable. He is still cured for hep C. No need further treatment  Recommend to still do hepatic imaging for [facility] surveillance Q6-74months.

## 2019-07-01 ENCOUNTER — Telehealth: Payer: Self-pay

## 2019-07-01 NOTE — Telephone Encounter (Signed)
Patient called for lab results. All labs drawn on 06/27/19 during office visit. Some labs are current still in progress. Patient would like to know what his next steps are. LPN advised patient that once all labs have been resulted Dr. Baxter Flattery will review them and make him aware of results and plan.  Eugenia Mcalpine

## 2019-07-05 NOTE — Telephone Encounter (Signed)
This patient does not have active Hepatitis C. He has been treated and cured. His Hepatitis C antibody will always be positive but his viral load is negative indicating no active infection.  Can you let everyone know that he does not need treatment and does not need to see [place] again? Thank you!

## 2019-07-05 NOTE — Telephone Encounter (Signed)
Office of Dr. Gorden Harms called also calling about plan for Hep C treatment. States patient needs Hep C treatment prior to having MS medication. Routing again to provider and Pharmacy for advise.  [name]

## 2019-07-05 NOTE — Telephone Encounter (Signed)
Patient calling once again for plans of treatment for HEP C. Routing message to [place] team and provider for advise. Patient would like a call back.  [name]

## 2019-07-05 NOTE — Telephone Encounter (Signed)
Patient made aware of [facility] Pharmacist response of no active Hepatitis C and he has been treated and cured. Also attempted to call Dr. Aggie Hacker office back to make aware. Office is currently closed. LPN will attempted to call back again tomorrow. No further concerns/question voiced by the patient. Patient appreciative of return call . [name]

## 2019-07-06 NOTE — Telephone Encounter (Signed)
Spoke with [name] from Dr. Aggie Hacker office and made aware of [facility]  Pharmacist response of no active Hep C. Faxed last office notes as well.  [name]

## 2019-07-07 LAB — HEPATITIS C RNA QUANTITATIVE
HCV Quantitative Log: <1.18 Log IU/mL
HCV RNA, PCR, QN: <15 IU/mL

## 2019-07-07 LAB — COMPLETE METABOLIC PANEL WITH GFR
AG Ratio: 1.8 (calc) (ref 1.0–2.5)
ALT: 8 U/L — ABNORMAL LOW (ref 9–46)
AST: 10 U/L (ref 10–35)
Albumin: 4 g/dL (ref 3.6–5.1)
Alkaline phosphatase (APISO): 100 U/L (ref 35–144)
BUN: 9 mg/dL (ref 7–25)
CO2: 23 mmol/L (ref 20–32)
Calcium: 9.6 mg/dL (ref 8.6–10.3)
Chloride: 115 mmol/L — ABNORMAL HIGH (ref 98–110)
Creat: 0.89 mg/dL (ref 0.70–1.33)
GFR, Est African American: 109 mL/min/{1.73_m2} (ref >=60)
GFR, Est Non African American: 94 mL/min/{1.73_m2} (ref >=60)
Globulin: 2.2 g/dL (calc) (ref 1.9–3.7)
Glucose, Bld: 94 mg/dL (ref 65–99)
Potassium: 4.3 mmol/L (ref 3.5–5.3)
Sodium: 146 mmol/L (ref 135–146)
Total Bilirubin: 0.3 mg/dL (ref 0.2–1.2)
Total Protein: 6.2 g/dL (ref 6.1–8.1)

## 2019-07-07 LAB — PROTIME-INR
INR: 1
Prothrombin Time: 10.7 s (ref 9.0–11.5)

## 2019-07-07 LAB — HEPATITIS C GENOTYPE: HCV Genotype: NOT DETECTED

## 2019-07-08 NOTE — Telephone Encounter (Signed)
Thank you :)

## 2019-07-16 ENCOUNTER — Other Ambulatory Visit: Payer: Self-pay

## 2019-07-16 ENCOUNTER — Encounter (HOSPITAL_COMMUNITY): Payer: Self-pay | Admitting: Emergency Medicine

## 2019-07-16 ENCOUNTER — Emergency Department (HOSPITAL_COMMUNITY): Payer: 59

## 2019-07-16 ENCOUNTER — Emergency Department (HOSPITAL_COMMUNITY)
Admission: EM | Admit: 2019-07-16 | Discharge: 2019-07-16 | Disposition: A | Discharge status: Home / Self Care | Payer: 59 | Attending: Emergency Medicine | Admitting: Emergency Medicine

## 2019-07-16 DIAGNOSIS — Z79899 Other long term (current) drug therapy: Secondary | ICD-10-CM | POA: Diagnosis not present

## 2019-07-16 DIAGNOSIS — Y92002 Bathroom of unspecified non-institutional (private) residence single-family (private) house as the place of occurrence of the external cause: Secondary | ICD-10-CM | POA: Diagnosis not present

## 2019-07-16 DIAGNOSIS — Y999 Unspecified external cause status: Secondary | ICD-10-CM | POA: Insufficient documentation

## 2019-07-16 DIAGNOSIS — X509XXA Other and unspecified overexertion or strenuous movements or postures, initial encounter: Secondary | ICD-10-CM | POA: Insufficient documentation

## 2019-07-16 DIAGNOSIS — G35 Multiple sclerosis: Secondary | ICD-10-CM | POA: Diagnosis not present

## 2019-07-16 DIAGNOSIS — I10 Essential (primary) hypertension: Secondary | ICD-10-CM | POA: Diagnosis not present

## 2019-07-16 DIAGNOSIS — Z87891 Personal history of nicotine dependence: Secondary | ICD-10-CM | POA: Insufficient documentation

## 2019-07-16 DIAGNOSIS — S8991XA Unspecified injury of right lower leg, initial encounter: Secondary | ICD-10-CM | POA: Diagnosis present

## 2019-07-16 DIAGNOSIS — S86911A Strain of unspecified muscle(s) and tendon(s) at lower leg level, right leg, initial encounter: Secondary | ICD-10-CM | POA: Diagnosis not present

## 2019-07-16 DIAGNOSIS — Y93E1 Activity, personal bathing and showering: Secondary | ICD-10-CM | POA: Insufficient documentation

## 2019-07-16 MED ORDER — OXYCODONE-ACETAMINOPHEN 5-325 MG PO TABS
1.0000 | ORAL_TABLET | Freq: Once | ORAL | Status: AC
  Administered 2019-07-16: 12:00:00 1 via ORAL
  Filled 2019-07-16: qty 1

## 2019-07-16 NOTE — ED Notes (Signed)
POSSIBLE DISLOCATED RIGHT KNEE LOSS OF SENSATION BLOW KNEE SWELLING AND DEFORMITY CONFIRMED. POPLITIEAL AND DORSALIS PEDIS PULSES ALSO CONFIRMED.

## 2019-07-16 NOTE — ED Provider Notes (Signed)
[facility]-EMERGENCY DEPT Provider Note   CSN: [id number] Arrival date & time: 07/16/19  1056     History   Chief Complaint No chief complaint on file.   HPI [name] is a 58 y.o. male.  Presents emerged from chief complaint right knee pain.  States that on Thursday while getting out of the shower he felt like his right knee suddenly gave out on him.  Then on Friday had a similar episode where his knee "gave out".  States that he constantly deals with lower extremity pain, chronically, managed with Percocet, states this has helped his right knee pain but still has not completely taken away.  He has noted some swelling to his right knee after the injury but denies any sudden trauma or dislocation.    HPI  Past Medical History:  Diagnosis Date  . Abdominal pain, unspecified site   . Anxiety   . Arthritis   . Benign paroxysmal positional vertigo   . Cellulitis and abscess of right leg 04/2017  . Chronic back pain   . Chronic pain syndrome 01/25/2008  . Cluster headache   . Depression    takes Zoloft daily  . DVT (deep venous thrombosis) (HCC)    in the left arm '09  . Gait abnormality    "uses mobile wheelchair, but is ambulatory"  . Gallstones 02/17/2009   resolved after gallbladder surgery.  [name]. GERD (gastroesophageal reflux disease)    takes Omeprazole as needed  . Headache(784.0)    cluster headaches frequently-takes Topamax daily  . History of colonoscopy   . HTN (hypertension)    takes Lisinopril,Verapamil,and Triamterene HCTZ daily  . Insomnia 11/06/2008  . Joint pain   . Joint swelling    03-07-16 "swelling of right wrist" "after a fall-xray done 03-06-16 "no fractures".  . Memory loss    no an issue at present 03-07-16  . Multiple sclerosis (HCC)    Dx. 2005 - Dr. Tinnie GensJeffrey follows LOV 4'17 tx. [facility]abri [facility] [place].  [name]. Nonspecific elevation of levels of transaminase or lactic acid dehydrogenase (LDH)   .  Other specified visual disturbances   . Other syndromes affecting cervical region   . Pneumonia 2009  . Trigeminal neuralgia     history" Multiple sclerosis"    Patient Active Problem List   Diagnosis Date Noted  . Hepatitis B core antibody positive 04/29/2019  . Chronic right shoulder pain 09/21/2018  . Bilateral primary osteoarthritis of knee 09/21/2018  . Drug overdose 04/08/2018  . Suicide attempt (HCC)   . Overdose by acetaminophen, accidental or unintentional, initial encounter 03/18/2018  . Intentional acetaminophen overdose (HCC)   . Severe major depression, single episode, with psychotic features (HCC) 10/04/2017  . Weakness   . Cellulitis of right leg 05/07/2017  . Right carpal tunnel syndrome 02/16/2017  . Gait disturbance 01/22/2017  . Insomnia 01/22/2017  . Depression with anxiety 01/22/2017  . High risk medication use 01/22/2017  . Major depressive disorder, recurrent episode (HCC) 12/19/2016  . Suicidal ideation   . AKI (acute kidney injury) (HCC) 11/15/2016  . Chronic hepatitis C virus infection (HCC) 11/15/2016  . Chronic abdominal pain 11/15/2016  . Chronic pain syndrome 11/15/2016  . Acute retention of urine 11/15/2016  . Generalized abdominal pain   . Chronic cluster headache, not intractable   . Community acquired pneumonia   . TB lung, latent   . HCAP (healthcare-associated pneumonia) 02/16/2016  . Hypokalemia 02/16/2016  . Intractable cluster headache  syndrome   . DVT (deep venous thrombosis) (HCC)   . Depression   . GERD (gastroesophageal reflux disease)   . HTN (hypertension)   . Essential hypertension   . Gastroesophageal reflux disease without esophagitis   . CAP (community acquired pneumonia)   . Multiple sclerosis (HCC) 08/02/2013  . ABDOMINAL BLOATING 10/14/2010  . LOOSE STOOLS 10/14/2010  . PULMONARY EMBOLISM, HX OF 10/14/2010    Past Surgical History:  Procedure Laterality Date  . CHOLECYSTECTOMY  02/20/2009  . COLONOSCOPY WITH  PROPOFOL N/A 03/17/2016   Procedure: COLONOSCOPY WITH PROPOFOL;  Surgeon: Carman ChingJames Edwards, MD;  Location: [facility];  Service: Endoscopy;  Laterality: N/A;  . PORT A CATH REVISION N/A 07/06/2015   Procedure: Removal and replacement of PORT A CATH;  Surgeon: Claud KelpHaywood Ingram, MD;  [facility];  Service: General;  Laterality: N/A;  . PORTACATH PLACEMENT N/A 03/27/2014   Procedure: INSERTION PORT-A-CATH;  Surgeon: Ernestene MentionHaywood M Ingram, MD;  Location: [facility] OR;  Service: General;  Laterality: N/A;        Home Medications    Prior to Admission medications   Medication Sig Start Date End Date Taking? Authorizing Provider  AMPYRA 10 MG TB12 Take 10 mg by mouth 2 (two) times daily. 08/12/18   [provider]  baclofen (LIORESAL) 20 MG tablet Take 1 tablet (20 mg total) by mouth 4 (four) times daily. For muscle spasms Patient taking differently: Take 20 mg by mouth 4 (four) times daily as needed for muscle spasms.  10/09/17   [name], Agnes I, NP  clotrimazole-betamethasone (LOTRISONE) cream Apply 1 application topically 2 (two) times daily as needed (for irritation- AS DIRECTED).  09/13/18   [provider]  dicyclomine (BENTYL) 20 MG tablet Take 20 mg by mouth every 6 (six) hours as needed for spasms.  08/16/18   [provider]  doxycycline (VIBRAMYCIN) 100 MG capsule Take 100 mg by mouth 2 (two) times daily. 09/13/18   [provider]  fluocinonide cream (LIDEX) 0.05 % Apply 1 application topically 2 (two) times daily as needed (to affected areas, as directed- avoid face, groin, and axillae).  09/10/18   [provider]  gabapentin (NEURONTIN) 600 MG tablet Take 600 mg by mouth 4 (four) times daily.  10/25/17   [provider]  hydrochlorothiazide (HYDRODIURIL) 25 MG tablet Take 25 mg by mouth daily.    [provider]  hydrOXYzine (ATARAX/VISTARIL) 25 MG tablet Take 1 tablet (25 mg total) by mouth every 6 (six) hours. Patient not taking: Reported  on 06/27/2019 11/04/18   Fayrene Helperran, Bowie, PA-C  omeprazole (PRILOSEC) 40 MG capsule Take 1 capsule (40 mg total) by mouth every evening. For acid reflux Patient not taking: Reported on 06/27/2019 10/09/17   [name], Agnes I, NP  Oxcarbazepine (TRILEPTAL) 300 MG tablet Take 1 tablet (300 mg total) by mouth 3 (three) times daily. For mood stabilization 10/09/17   [name], Agnes I, NP  oxyCODONE-acetaminophen (PERCOCET) 10-325 MG tablet Take 1 tablet by mouth every 8 (eight) hours as needed for pain. 04/09/18   Zannie CoveJoseph, Preetha, MD  permethrin (ELIMITE) 5 % cream Apply 1 application topically daily as needed (skin care).     [provider]  risperiDONE (RISPERDAL) 0.25 MG tablet Take 1 tablet (0.25 mg total) by mouth at bedtime. 04/09/18   Zannie CoveJoseph, Preetha, MD  riTUXimab (RITUXAN IV) Inject 1 Applicatorful into the vein every 4 (four) months.     [provider]  sertraline (ZOLOFT) 50 MG tablet Take 1  tablet (50 mg total) by mouth daily. For depression 10/10/17   Armandina Stammer I, NP  SUMAtriptan (IMITREX) 100 MG tablet Take 100 mg by mouth See admin instructions. Take 100 mg by mouth at onset of headache, may repeat in 2 hours, if needed- not to exceed 2 tablets/24 hrs 08/27/18   [provider]  topiramate (TOPAMAX) 100 MG tablet Take 150 mg by mouth at bedtime.  11/13/17   [provider]  topiramate (TOPAMAX) 25 MG tablet Take 75 mg by mouth every morning.     [provider]  traMADol (ULTRAM) 50 MG tablet Take 1 tablet (50 mg total) by mouth every 6 (six) hours as needed. Patient taking differently: Take 50 mg by mouth every 6 (six) hours as needed for moderate pain.  11/04/18   Fayrene Helper, PA-C  traZODone (DESYREL) 100 MG tablet Take 100 mg by mouth at bedtime.  12/16/17   [provider]  verapamil (CALAN) 80 MG tablet Take 80 mg by mouth 2 (two) times daily. 07/22/18   [provider]  XARELTO 10 MG TABS tablet Take 1 tablet (10 mg total) by mouth  daily. For blood clot prevention 10/09/17   Sanjuana Kava, NP    Family History Family History  Problem Relation Age of Onset  . Cancer Father   . Diabetes Mother     Social History Social History   Tobacco Use  . Smoking status: Former Smoker    Packs/day: 1.00    Years: 38.00    Pack years: 38.00    Types: Cigarettes  . Smokeless tobacco: Never Used  . Tobacco comment: cutting back  Substance Use Topics  . Alcohol use: Yes    Alcohol/week: 0.0 standard drinks    Comment: occasional  . Drug use: No    Comment: Quit 2011     Allergies   Acyclovir and related   Review of Systems Review of Systems  Constitutional: Negative for chills and fever.  HENT: Negative for ear pain and sore throat.   Eyes: Negative for pain and visual disturbance.  Respiratory: Negative for cough and shortness of breath.   Cardiovascular: Negative for chest pain and palpitations.  Gastrointestinal: Negative for abdominal pain and vomiting.  Genitourinary: Negative for dysuria and hematuria.  Musculoskeletal: Positive for arthralgias and joint swelling. Negative for back pain.  Skin: Negative for color change and rash.  Neurological: Negative for seizures and syncope.  All other systems reviewed and are negative.    Physical Exam Updated Vital Signs BP (!) 113/95 (BP Location: Left Arm)   Pulse 64   Temp 98.3 F (36.8 C) (Oral)   Resp 16   Ht 5\' 9"  (1.753 m)   Wt 77.1 kg   SpO2 100%   BMI 25.10 kg/m   Physical Exam Vitals signs and nursing note reviewed.  Constitutional:      Appearance: He is well-developed.     Comments: Well appearing, sitting up in his wheelchair in no acute distress  HENT:     Head: Normocephalic and atraumatic.  Eyes:     Conjunctiva/sclera: Conjunctivae normal.  Neck:     Musculoskeletal: Neck supple.  Cardiovascular:     Rate and Rhythm: Normal rate and regular rhythm.     Heart sounds: No murmur.  Pulmonary:     Effort: Pulmonary effort is  normal. No respiratory distress.     Breath sounds: Normal breath sounds.  Abdominal:     Palpations: Abdomen is soft.  Tenderness: There is no abdominal tenderness.  Musculoskeletal:     Comments: RLE: no obvious deformity, TTP over right medial knee, normal ROM hip, knee, ankle; 2+ dp and pt pulses, distal motor and sensation intact LLE: no obvious deformity, no TTP throughout extremity, normal ROM hip, knee, ankle; 2+ dp and pt pulses, distal motor and sensation intact  Skin:    General: Skin is warm and dry.     Capillary Refill: Capillary refill takes less than 2 seconds.  Neurological:     Mental Status: He is alert and oriented to person, place, and time. Mental status is at baseline.      ED Treatments / Results  Labs (all labs ordered are listed, but only abnormal results are displayed) Labs Reviewed - No data to display  EKG None  Radiology No results found.  Procedures Procedures (including critical care time)  Medications Ordered in ED Medications  oxyCODONE-acetaminophen (PERCOCET/ROXICET) 5-325 MG per tablet 1 tablet (has no administration in time range)     Initial Impression / Assessment and Plan / ED Course  I have reviewed the triage vital signs and the nursing notes.  Pertinent labs & imaging results that were available during my care of the patient were reviewed by me and considered in my medical decision making (see chart for details).  Clinical Course as of Jul 15 1216  Sat Jul 16, 2019  1130 Completed initial assessment   [RD]    Clinical Course User Index [RD] Lucrezia Starch, MD       58 year old gentleman past medical history significant for hepatitis C presents the emergency department with a chief complaint of right knee pain after straining knee couple days ago.  Wheelchair-bound due to MS but does use legs for transfer.  No significant trauma, no dislocation.  Neurovascularly intact.  Noted some tenderness over his knee though,  obtained x-ray which was negative for fracture dislocation.  Suspect muscle strain.  Recommended follow-up with PCP, Ortho, physical therapy.  Reviewed return precautions, believe appropriate for discharge and outpatient management this time.  After the discussed management above, the patient was determined to be safe for discharge.  The patient was in agreement with this plan and all questions regarding their care were answered.  ED return precautions were discussed and the patient will return to the ED with any significant worsening of condition.    Final Clinical Impressions(s) / ED Diagnoses   Final diagnoses:  Knee strain, right, initial encounter    ED Discharge Orders    None       Lucrezia Starch, MD 07/16/19 1316

## 2019-07-16 NOTE — Discharge Instructions (Signed)
Record taking your previously prescribed Percocet as needed for pain control.  Recommend following up both with your primary care doctor as well as with orthopedics.  No number provided in this discharge paperwork.  If you have any further falls, suffer from worsening pain, numbness, weakness, please return to ER for reassessment.

## 2019-07-16 NOTE — ED Triage Notes (Signed)
Patient slipped getting out of the shower Thursday and Friday. He experienced right leg weakness.

## 2019-07-20 ENCOUNTER — Encounter: Payer: Self-pay | Admitting: Orthopaedic Surgery

## 2019-07-20 ENCOUNTER — Ambulatory Visit (INDEPENDENT_AMBULATORY_CARE_PROVIDER_SITE_OTHER): Payer: 59 | Admitting: Orthopaedic Surgery

## 2019-07-20 VITALS — Ht 69.0 in | Wt 170.0 lb

## 2019-07-20 DIAGNOSIS — M25511 Pain in right shoulder: Secondary | ICD-10-CM | POA: Diagnosis not present

## 2019-07-20 DIAGNOSIS — G8929 Other chronic pain: Secondary | ICD-10-CM | POA: Diagnosis not present

## 2019-07-20 DIAGNOSIS — M1711 Unilateral primary osteoarthritis, right knee: Secondary | ICD-10-CM | POA: Diagnosis not present

## 2019-07-20 MED ORDER — METHYLPREDNISOLONE ACETATE 40 MG/ML IJ SUSP
40.0000 mg | INTRAMUSCULAR | Status: AC | PRN
  Administered 2019-07-20: 15:00:00 40 mg via INTRA_ARTICULAR

## 2019-07-20 MED ORDER — BUPIVACAINE HCL 0.25 % IJ SOLN
2.0000 mL | INTRAMUSCULAR | Status: AC | PRN
  Administered 2019-07-20: 15:00:00 2 mL via INTRA_ARTICULAR

## 2019-07-20 MED ORDER — BUPIVACAINE HCL 0.25 % IJ SOLN
2.0000 mL | INTRAMUSCULAR | Status: AC | PRN
  Administered 2019-07-20: 2 mL via INTRA_ARTICULAR

## 2019-07-20 MED ORDER — METHYLPREDNISOLONE ACETATE 40 MG/ML IJ SUSP
40.0000 mg | INTRAMUSCULAR | Status: AC | PRN
  Administered 2019-07-20: 40 mg via INTRA_ARTICULAR

## 2019-07-20 MED ORDER — LIDOCAINE HCL 1 % IJ SOLN
2.0000 mL | INTRAMUSCULAR | Status: AC | PRN
  Administered 2019-07-20: 15:00:00 2 mL

## 2019-07-20 MED ORDER — LIDOCAINE HCL 2 % IJ SOLN
2.0000 mL | INTRAMUSCULAR | Status: AC | PRN
  Administered 2019-07-20: 15:00:00 2 mL

## 2019-07-20 NOTE — Progress Notes (Signed)
Office Visit Note   Patient: [name]           Date of Birth: [birth year]           MRN: [id number] Visit Date: 07/20/2019              Requested by: Fleet Contras, MD [address] [place],  [place] [address] PCP: Fleet Contras, MD   Assessment & Plan: Visit Diagnoses:  1. Unilateral primary osteoarthritis, right knee   2. Chronic right shoulder pain     Plan: Impression is recurrent right knee osteoarthritis and right shoulder subacromial bursitis.  We will inject both with cortisone today.  He will follow-up with [place] as needed.  Call with concerns or questions in the meantime.  Follow-Up Instructions: Return if symptoms worsen or fail to improve.   Orders:  Orders Placed This Encounter  Procedures  . Large Joint Inj: R knee  . Large Joint Inj: R subacromial bursa   No orders of the defined types were placed in this encounter.     Procedures: Large Joint Inj: R knee on 07/20/2019 2:44 PM Indications: pain Details: 22 G needle, anterolateral approach Medications: 2 mL bupivacaine 0.25 %; 2 mL lidocaine 1 %; 40 mg methylPREDNISolone acetate 40 MG/ML  Large Joint Inj: R subacromial bursa on 07/20/2019 2:44 PM Indications: pain Details: 22 G needle Medications: 2 mL bupivacaine 0.25 %; 2 mL lidocaine 2 %; 40 mg methylPREDNISolone acetate 40 MG/ML Outcome: tolerated well, no immediate complications Patient was prepped and draped in the usual sterile fashion.       Clinical Data: No additional findings.   Subjective: Chief Complaint  Patient presents with  . Right Shoulder - Pain, Follow-up  . Right Knee - Pain, Follow-up    HPI patient is a pleasant 58 year old gentleman with a past medical history of multiple sclerosis who presents to our clinic today with recurrent right knee and right shoulder pain.  He was seen by [place] in November 2019 for both of these problems.  Cortisone injections performed which significantly helped until about a week ago after  sustaining a fall onto his knee.  Pain has returned to both areas.  In regards to his shoulder, his pain is primarily over the deltoid.  Worse with any range of motion.  In regards to the knee, he has pain to the entire aspect as well as associated giving way.  No locking or catching.  Review of Systems as detailed in HPI.  All others reviewed and are negative.   Objective: Vital Signs: Ht 5\' 9"  (1.753 m)   Wt 170 lb (77.1 kg)   BMI 25.10 kg/m   Physical Exam well-developed and well-nourished gentleman in no acute distress.  Alert and oriented x3.  Ortho Exam examination of his right knee reveals no effusion.  Minimal medial joint line tenderness.  Range of motion 0 to 95 degrees.  He is unable to straight leg raise, but this is not a new finding given his MS symptoms.  Right shoulder has full active range of motion in all planes although he has pains at the extremes of forward flexion and internal rotation.  Positive empty can.  He is neurovascular intact distally.  Specialty Comments:  No specialty comments available.  Imaging: No new imaging   PMFS History: Patient Active Problem List   Diagnosis Date Noted  . Hepatitis B core antibody positive 04/29/2019  . Chronic right shoulder pain 09/21/2018  . Bilateral primary osteoarthritis of knee 09/21/2018  .  Drug overdose 04/08/2018  . Suicide attempt (HCC)   . Overdose by acetaminophen, accidental or unintentional, initial encounter 03/18/2018  . Intentional acetaminophen overdose (HCC)   . Severe major depression, single episode, with psychotic features (HCC) 10/04/2017  . Weakness   . Cellulitis of right leg 05/07/2017  . Right carpal tunnel syndrome 02/16/2017  . Gait disturbance 01/22/2017  . Insomnia 01/22/2017  . Depression with anxiety 01/22/2017  . High risk medication use 01/22/2017  . Major depressive disorder, recurrent episode (HCC) 12/19/2016  . Suicidal ideation   . AKI (acute kidney injury) (HCC) 11/15/2016  .  Chronic hepatitis C virus infection (HCC) 11/15/2016  . Chronic abdominal pain 11/15/2016  . Chronic pain syndrome 11/15/2016  . Acute retention of urine 11/15/2016  . Generalized abdominal pain   . Chronic cluster headache, not intractable   . Community acquired pneumonia   . TB lung, latent   . HCAP (healthcare-associated pneumonia) 02/16/2016  . Hypokalemia 02/16/2016  . Intractable cluster headache syndrome   . DVT (deep venous thrombosis) (HCC)   . Depression   . GERD (gastroesophageal reflux disease)   . HTN (hypertension)   . Essential hypertension   . Gastroesophageal reflux disease without esophagitis   . CAP (community acquired pneumonia)   . Multiple sclerosis (HCC) 08/02/2013  . ABDOMINAL BLOATING 10/14/2010  . LOOSE STOOLS 10/14/2010  . PULMONARY EMBOLISM, HX OF 10/14/2010   Past Medical History:  Diagnosis Date  . Abdominal pain, unspecified site   . Anxiety   . Arthritis   . Benign paroxysmal positional vertigo   . Cellulitis and abscess of right leg 04/2017  . Chronic back pain   . Chronic pain syndrome 01/25/2008  . Cluster headache   . Depression    takes Zoloft daily  . DVT (deep venous thrombosis) (HCC)    in the left arm '09  . Gait abnormality    "uses mobile wheelchair, but is ambulatory"  . Gallstones 02/17/2009   resolved after gallbladder surgery.  [name]. GERD (gastroesophageal reflux disease)    takes Omeprazole as needed  . Headache(784.0)    cluster headaches frequently-takes Topamax daily  . History of colonoscopy   . HTN (hypertension)    takes Lisinopril,Verapamil,and Triamterene HCTZ daily  . Insomnia 11/06/2008  . Joint pain   . Joint swelling    03-07-16 "swelling of right wrist" "after a fall-xray done 03-06-16 "no fractures".  . Memory loss    no an issue at present 03-07-16  . Multiple sclerosis (HCC)    Dx. 2005 - Dr. Tinnie GensJeffrey follows LOV 4'17 tx. Tysabri monthly IV-[facility] , [place].  [name]. Nonspecific elevation of  levels of transaminase or lactic acid dehydrogenase (LDH)   . Other specified visual disturbances   . Other syndromes affecting cervical region   . Pneumonia 2009  . Trigeminal neuralgia     history" Multiple sclerosis"    Family History  Problem Relation Age of Onset  . Cancer Father   . Diabetes Mother     Past Surgical History:  Procedure Laterality Date  . CHOLECYSTECTOMY  02/20/2009  . COLONOSCOPY WITH PROPOFOL N/A 03/17/2016   Procedure: COLONOSCOPY WITH PROPOFOL;  Surgeon: Carman ChingJames Edwards, MD;  Location: [facility];  Service: Endoscopy;  Laterality: N/A;  . PORT A CATH REVISION N/A 07/06/2015   Procedure: Removal and replacement of PORT A CATH;  Surgeon: Claud KelpHaywood Ingram, MD;  Location: [facility] OR;  Service: General;  Laterality: N/A;  . PORTACATH PLACEMENT N/A 03/27/2014  Procedure: INSERTION PORT-A-CATH;  Surgeon: Adin Hector, MD;  Location: [facility];  Service: General;  Laterality: N/A;   Social History   Occupational History    Comment: Disabled  Tobacco Use  . Smoking status: Former Smoker    Packs/day: 1.00    Years: 38.00    Pack years: 38.00    Types: Cigarettes  . Smokeless tobacco: Never Used  . Tobacco comment: cutting back  Substance and Sexual Activity  . Alcohol use: Yes    Alcohol/week: 0.0 standard drinks    Comment: occasional  . Drug use: No    Comment: Quit 2011  . Sexual activity: Not on file

## 2019-07-26 ENCOUNTER — Encounter (HOSPITAL_COMMUNITY)
Admission: RE | Admit: 2019-07-26 | Discharge: 2019-07-26 | Disposition: A | Discharge status: Home / Self Care | Payer: 59 | Source: Ambulatory Visit | Attending: Psychiatry | Admitting: Psychiatry

## 2019-07-26 ENCOUNTER — Other Ambulatory Visit: Payer: Self-pay

## 2019-07-26 ENCOUNTER — Encounter (HOSPITAL_COMMUNITY): Payer: Self-pay

## 2019-07-26 DIAGNOSIS — G35 Multiple sclerosis: Secondary | ICD-10-CM | POA: Insufficient documentation

## 2019-07-26 MED ORDER — HEPARIN SOD (PORK) LOCK FLUSH 100 UNIT/ML IV SOLN
500.0000 [IU] | INTRAVENOUS | Status: AC | PRN
  Administered 2019-07-26: 15:00:00 500 [IU]
  Filled 2019-07-26: qty 5

## 2019-07-26 MED ORDER — METHYLPREDNISOLONE SODIUM SUCC 125 MG IJ SOLR
125.0000 mg | INTRAMUSCULAR | Status: DC
  Administered 2019-07-26: 125 mg via INTRAVENOUS
  Filled 2019-07-26: qty 2

## 2019-07-26 MED ORDER — SODIUM CHLORIDE 0.9 % IV SOLN
INTRAVENOUS | Status: DC
  Administered 2019-07-26: 10:00:00 via INTRAVENOUS

## 2019-07-26 MED ORDER — SODIUM CHLORIDE 0.9 % IV SOLN
1000.0000 mg | INTRAVENOUS | Status: DC
  Administered 2019-07-26: 10:00:00 1000 mg via INTRAVENOUS
  Filled 2019-07-26: qty 100

## 2019-07-26 MED ORDER — ACETAMINOPHEN 325 MG PO TABS
650.0000 mg | ORAL_TABLET | ORAL | Status: DC
  Administered 2019-07-26: 10:00:00 650 mg via ORAL
  Filled 2019-07-26: qty 2

## 2019-07-26 MED ORDER — DIPHENHYDRAMINE HCL 50 MG/ML IJ SOLN
25.0000 mg | INTRAMUSCULAR | Status: DC
  Administered 2019-07-26: 10:00:00 25 mg via INTRAVENOUS
  Filled 2019-07-26: qty 1

## 2019-07-26 NOTE — Discharge Instructions (Signed)
Rituximab injection What is this medicine? RITUXIMAB (ri TUX i mab) is a monoclonal antibody. It is used to treat certain types of cancer like non-Hodgkin lymphoma and chronic lymphocytic leukemia. It is also used to treat rheumatoid arthritis, granulomatosis with polyangiitis (or Wegener's granulomatosis), microscopic polyangiitis, and pemphigus vulgaris. This medicine may be used for other purposes; ask your health care provider or pharmacist if you have questions. COMMON BRAND NAME(S): Rituxan, RUXIENCE What should I tell my health care provider before I take this medicine? They need to know if you have any of these conditions:  heart disease  infection (especially a virus infection such as hepatitis B, chickenpox, cold sores, or herpes)  immune system problems  irregular heartbeat  kidney disease  low blood counts, like low white cell, platelet, or red cell counts  lung or breathing disease, like asthma  recently received or scheduled to receive a vaccine  an unusual or allergic reaction to rituximab, other medicines, foods, dyes, or preservatives  pregnant or trying to get pregnant  breast-feeding How should I use this medicine? This medicine is for infusion into a vein. It is administered in a hospital or clinic by a specially trained health care professional. A special MedGuide will be given to you by the pharmacist with each prescription and refill. Be sure to read this information carefully each time. Talk to your pediatrician regarding the use of this medicine in children. This medicine is not approved for use in children. Overdosage: If you think you have taken too much of this medicine contact a poison control center or emergency room at once. NOTE: This medicine is only for you. Do not share this medicine with others. What if I miss a dose? It is important not to miss a dose. Call your doctor or health care professional if you are unable to keep an appointment. What  may interact with this medicine?  cisplatin  live virus vaccines This list may not describe all possible interactions. Give your health care provider a list of all the medicines, herbs, non-prescription drugs, or dietary supplements you use. Also tell them if you smoke, drink alcohol, or use illegal drugs. Some items may interact with your medicine. What should I watch for while using this medicine? Your condition will be monitored carefully while you are receiving this medicine. You may need blood work done while you are taking this medicine. This medicine can cause serious allergic reactions. To reduce your risk you may need to take medicine before treatment with this medicine. Take your medicine as directed. In some patients, this medicine may cause a serious brain infection that may cause death. If you have any problems seeing, thinking, speaking, walking, or standing, tell your healthcare professional right away. If you cannot reach your healthcare professional, urgently seek other source of medical care. Call your doctor or health care professional for advice if you get a fever, chills or sore throat, or other symptoms of a cold or flu. Do not treat yourself. This drug decreases your body's ability to fight infections. Try to avoid being around people who are sick. Do not become pregnant while taking this medicine or for at least 12 months after stopping it. Women should inform their doctor if they wish to become pregnant or think they might be pregnant. There is a potential for serious side effects to an unborn child. Talk to your health care professional or pharmacist for more information. Do not breast-feed an infant while taking this medicine or for at   least 6 months after stopping it. What side effects may I notice from receiving this medicine? Side effects that you should report to your doctor or health care professional as soon as possible:  allergic reactions like skin rash, itching or  hives; swelling of the face, lips, or tongue  breathing problems  chest pain  changes in vision  diarrhea  headache with fever, neck stiffness, sensitivity to light, nausea, or confusion  fast, irregular heartbeat  loss of memory  low blood counts - this medicine may decrease the number of white blood cells, red blood cells and platelets. You may be at increased risk for infections and bleeding.  mouth sores  problems with balance, talking, or walking  redness, blistering, peeling or loosening of the skin, including inside the mouth  signs of infection - fever or chills, cough, sore throat, pain or difficulty passing urine  signs and symptoms of kidney injury like trouble passing urine or change in the amount of urine  signs and symptoms of liver injury like dark yellow or brown urine; general ill feeling or flu-like symptoms; light-colored stools; loss of appetite; nausea; right upper belly pain; unusually weak or tired; yellowing of the eyes or skin  signs and symptoms of low blood pressure like dizziness; feeling faint or lightheaded, falls; unusually weak or tired  stomach pain  swelling of the ankles, feet, hands  unusual bleeding or bruising  vomiting Side effects that usually do not require medical attention (report to your doctor or health care professional if they continue or are bothersome):  headache  joint pain  muscle cramps or muscle pain  nausea  tiredness This list may not describe all possible side effects. Call your doctor for medical advice about side effects. You may report side effects to FDA at 1-800-FDA-1088. Where should I keep my medicine? This drug is given in a hospital or clinic and will not be stored at home. NOTE: This sheet is a summary. It may not cover all possible information. If you have questions about this medicine, talk to your doctor, pharmacist, or health care provider.  2020 [employer]vier/Gold Standard (2018-12-08  22:01:36)  

## 2019-07-27 IMAGING — CT CT HEAD W/O CM
4 series · 15 of 47 positions shown, 17 images · non-contrast
Comparison: 12/21/2017

CLINICAL DATA: Chest pain and weakness for a week. Headache and
right arm pain.

EXAM:
CT HEAD WITHOUT CONTRAST
TECHNIQUE: Contiguous axial images were obtained from the base of the skull
through the vertex without intravenous contrast.

[Series 3: head without · axial · non-contrast · 0.46mm/px · z∈[-88,+42]mm · 7 of 36 slices shown, 9 images]
[im 5/36  brain]
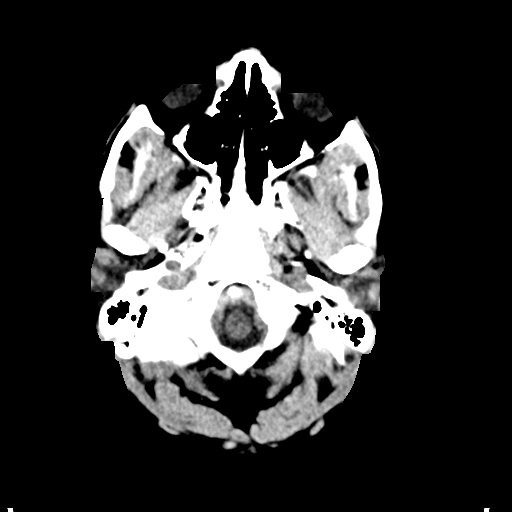
[im 5/36  bone]
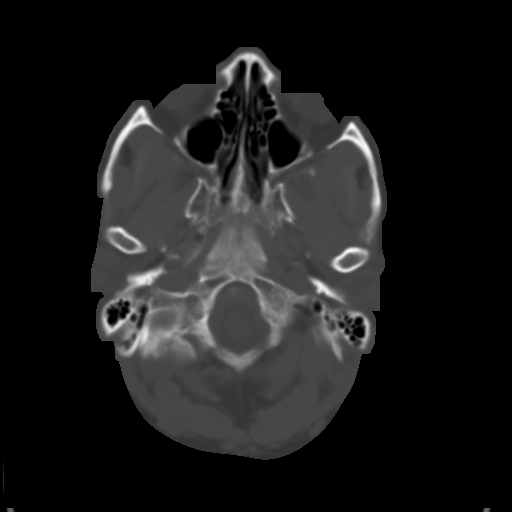
[im 9/36  brain]
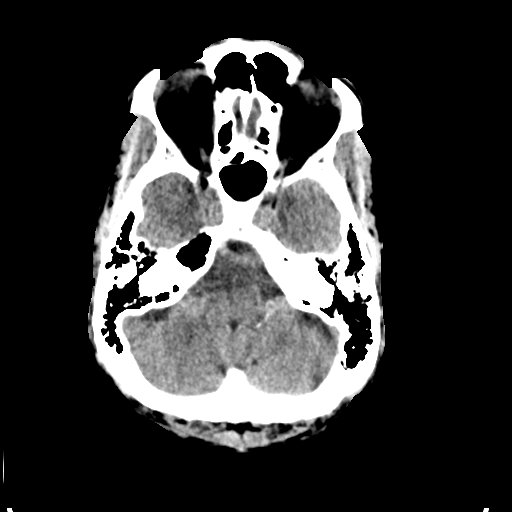
[im 14/36  brain]
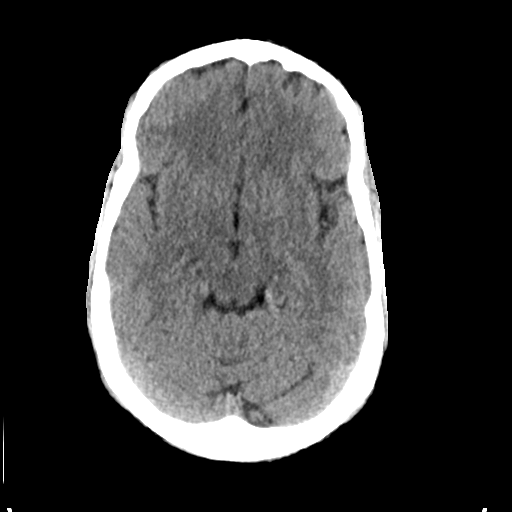
[im 18/36  brain]
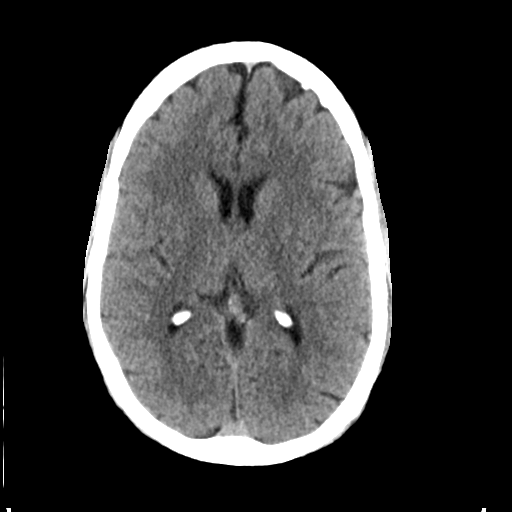
[im 22/36  brain]
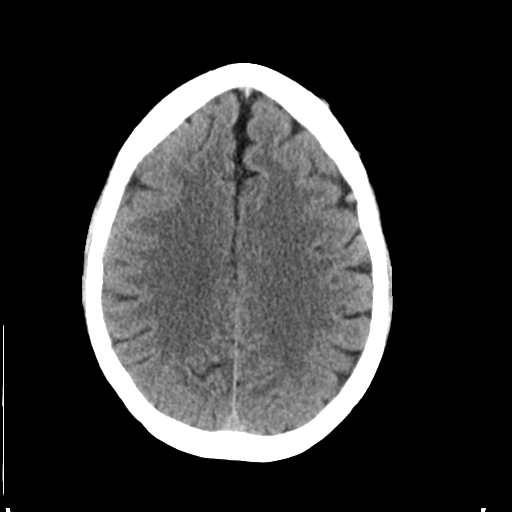
[im 22/36  bone]
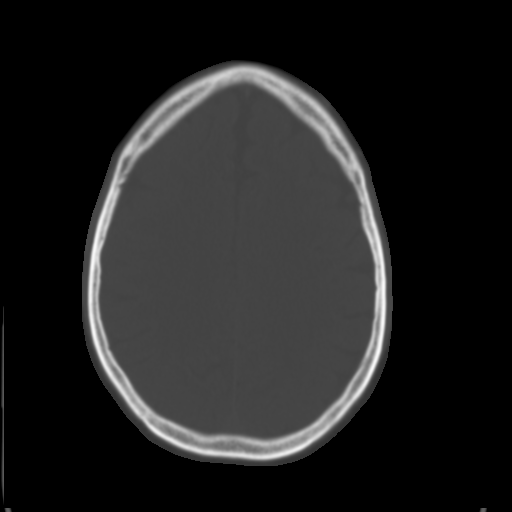
[im 27/36  brain]
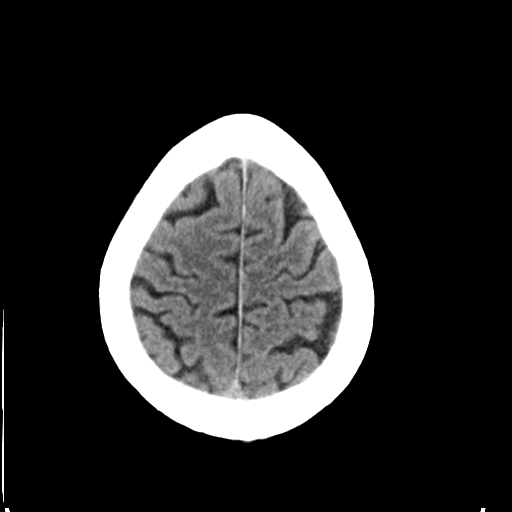
[im 31/36  brain]
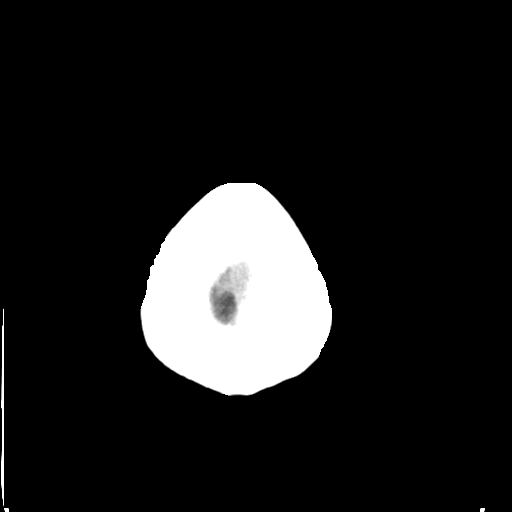

[Series 4: head bone · axial · 0.46mm/px · z∈[-92,-74]mm · 2 of 89 slices shown]
[im 9/89  bone]
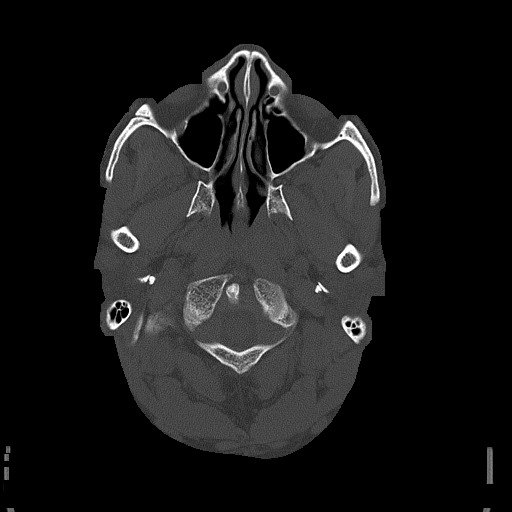
[im 18/89  bone]
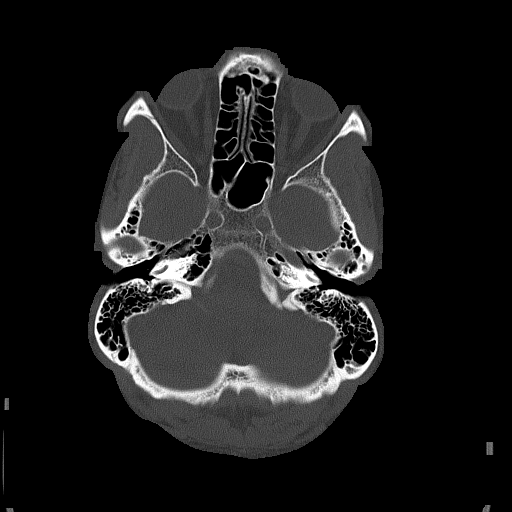

[Series 5: head without cor · coronal · non-contrast · 0.35mm/px · 3 of 75 slices shown]
[im 25/75  brain]
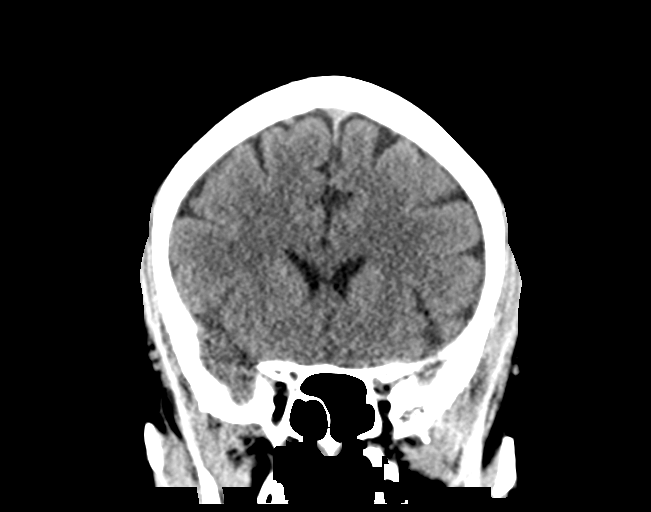
[im 33/75  brain]
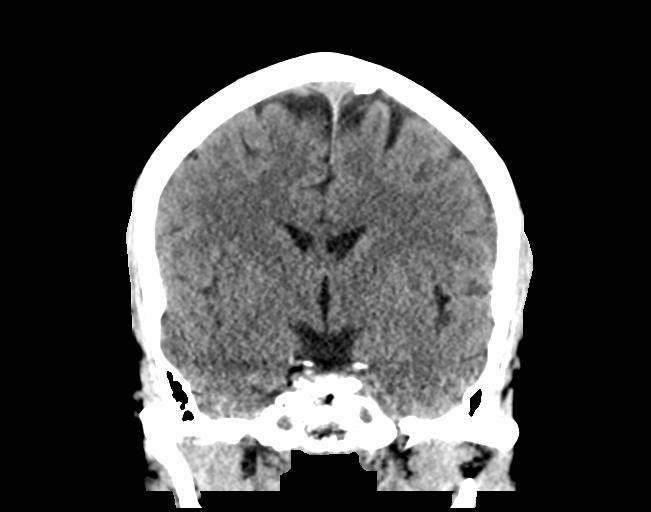
[im 42/75  brain]
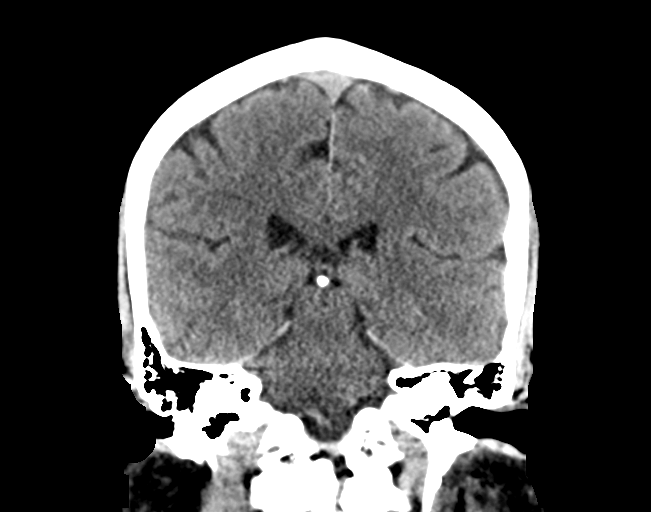

[Series 6: head without sag · sagittal · non-contrast · 0.35mm/px · 3 of 66 slices shown]
[im 22/66  brain]
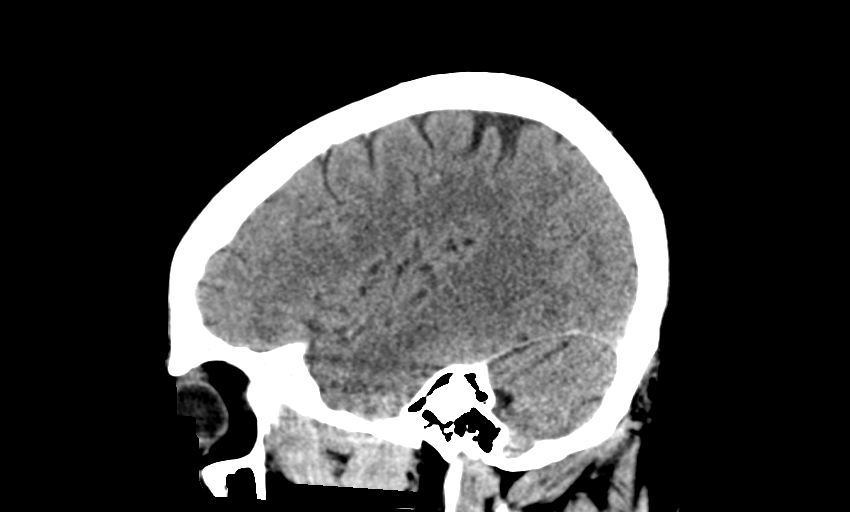
[im 33/66  brain]
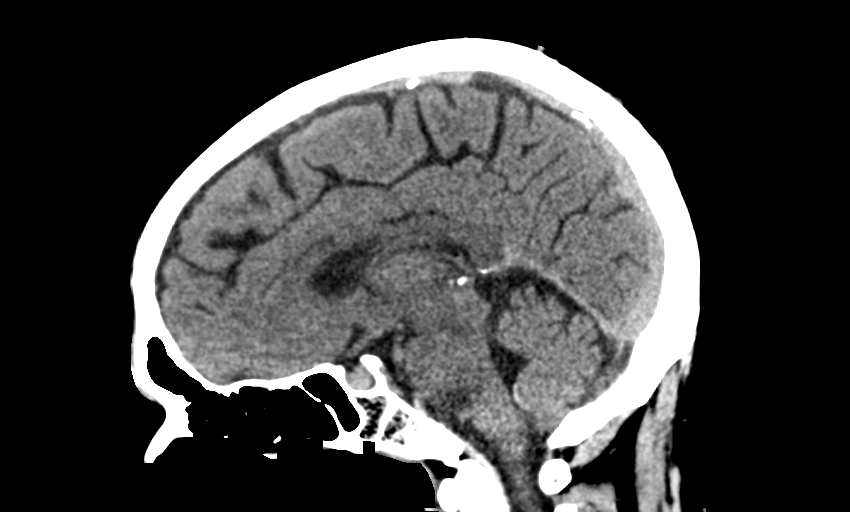
[im 44/66  brain]
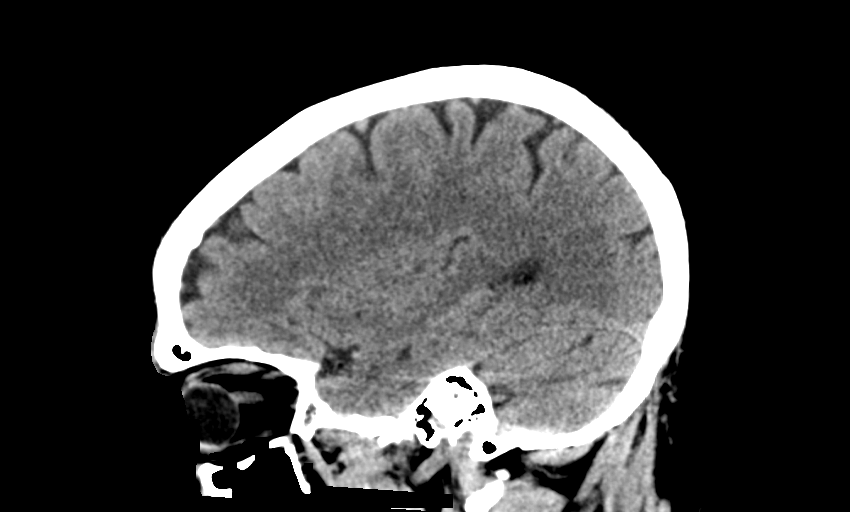

[15 of 47 positions shown; findings below may reference images not displayed]

FINDINGS: BRAIN: The ventricles and sulci are normal. No intraparenchymal
hemorrhage, mass effect nor midline shift. No acute large vascular
territory infarcts. Grey-white matter distinction is maintained. The
basal ganglia are unremarkable. No abnormal extra-axial fluid
collections. Basal cisterns are not effaced and midline. The
brainstem and cerebellar hemispheres are without acute
abnormalities.

VASCULAR: No hyperdense vessel or unexpected calcification.

SKULL/SOFT TISSUES: No acute calvarial fracture. No significant soft
tissue swelling.

ORBITS/SINUSES: The included ocular globes and orbital contents are
normal.The mastoid air cells are clear. The included paranasal
sinuses are well-aerated.

OTHER: None.
IMPRESSION: No acute abnormality noted.  Stable appearance of the brain.

## 2019-07-27 IMAGING — DX DG CHEST 2V
2 series · 2 of 2 positions shown · non-contrast
Comparison: 12/19/2017

CLINICAL DATA: Chest pain

EXAM:
CHEST - 2 VIEW

[chest lat]
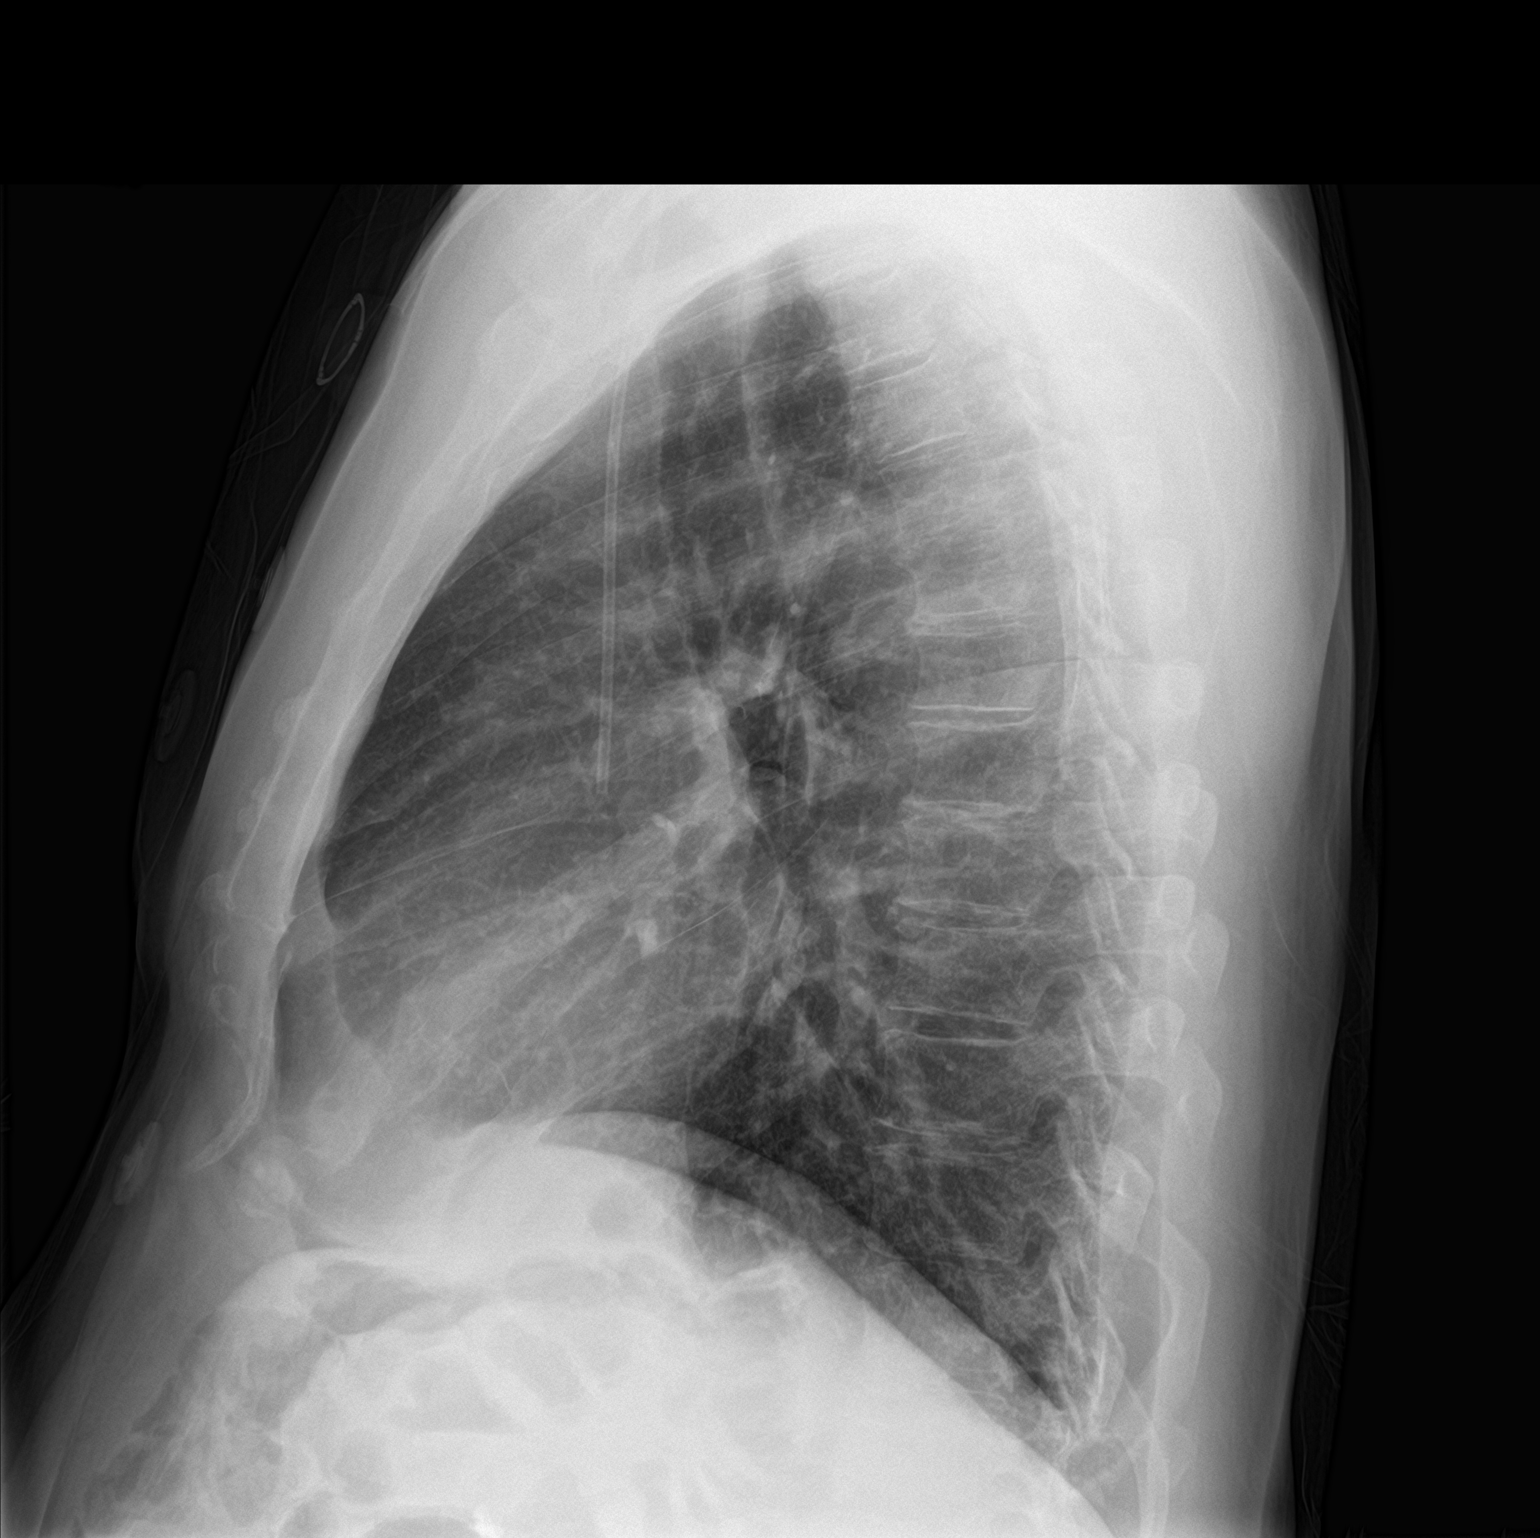

[chest ap]
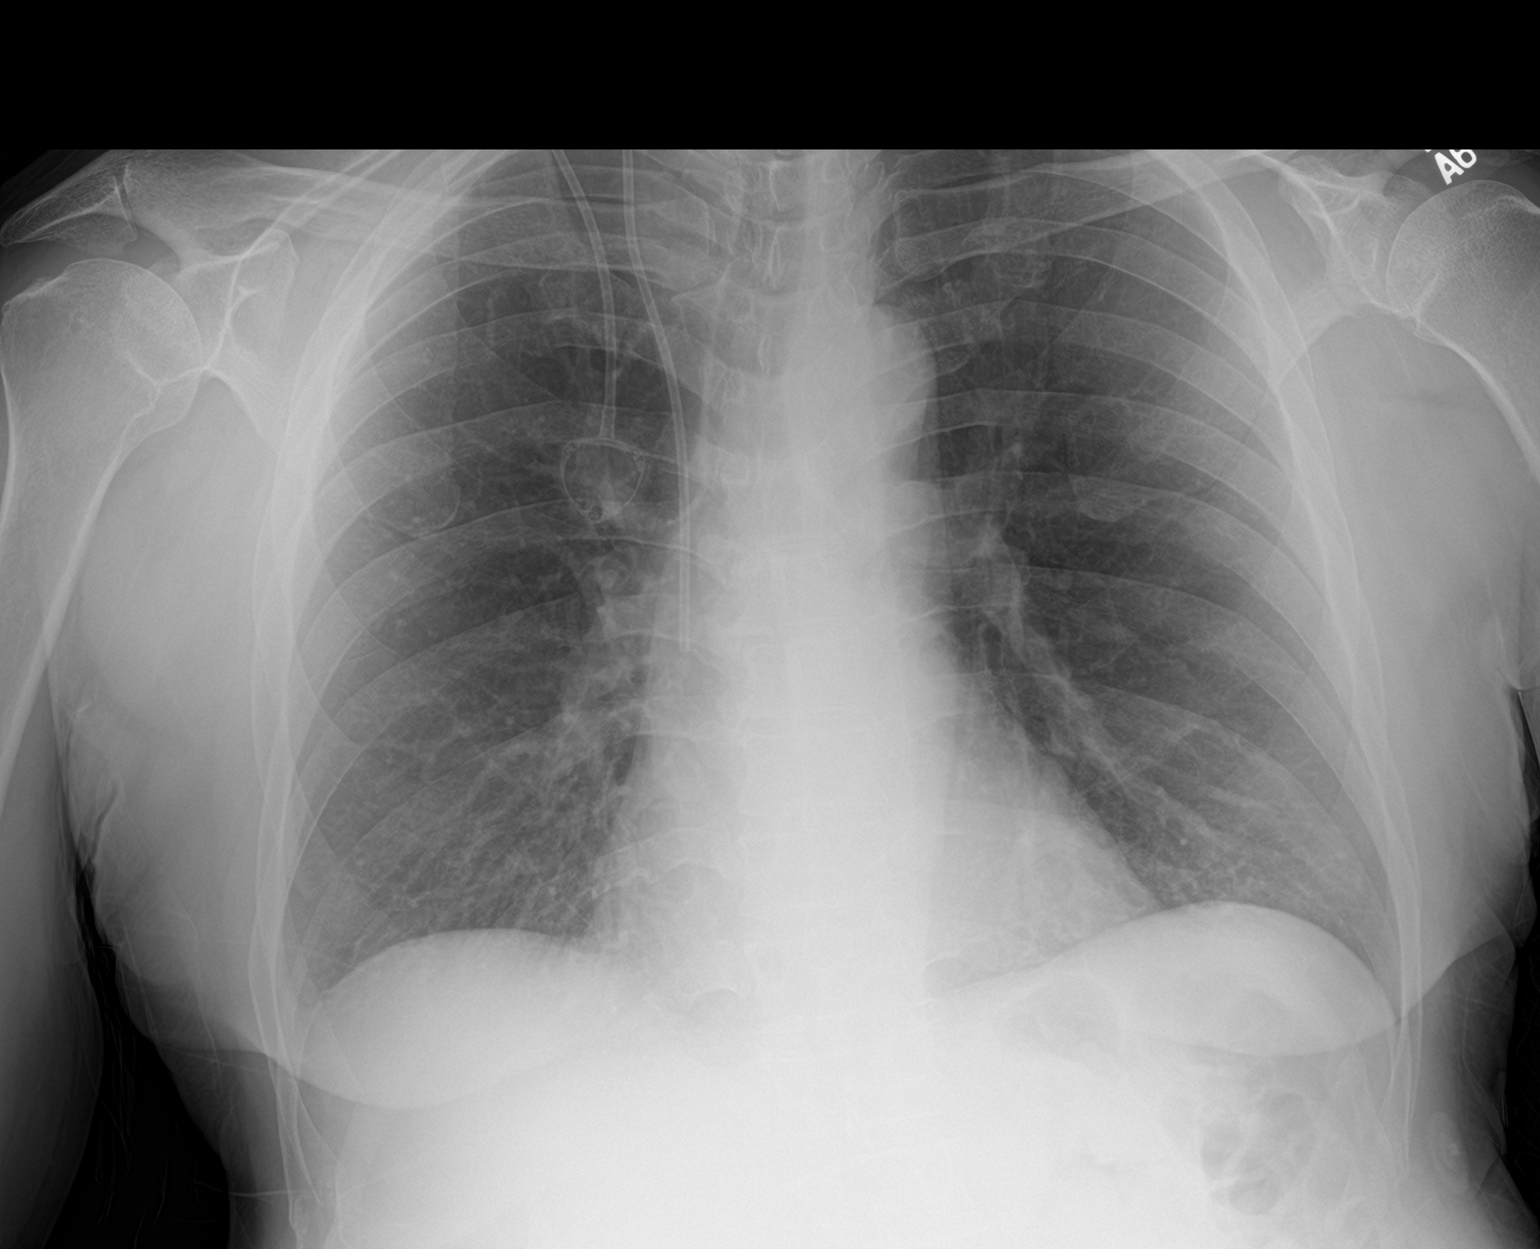

[2 of 2 positions shown; findings below may reference images not displayed]

FINDINGS: Right Port-A-Cath remains in place, unchanged. Heart and mediastinal
contours are within normal limits. No focal opacities or effusions.
No acute bony abnormality.
IMPRESSION: No active cardiopulmonary disease.

## 2019-08-09 ENCOUNTER — Encounter (HOSPITAL_COMMUNITY): Payer: 59

## 2019-08-11 IMAGING — DX DG SHOULDER 2+V*R*
3 series · 3 of 3 positions shown · non-contrast
Comparison: None.

CLINICAL DATA: Pain after a fall.  Multiple sclerosis.

EXAM:
RIGHT SHOULDER - 2+ VIEW

[x shoulder ap right (1 of 2)]
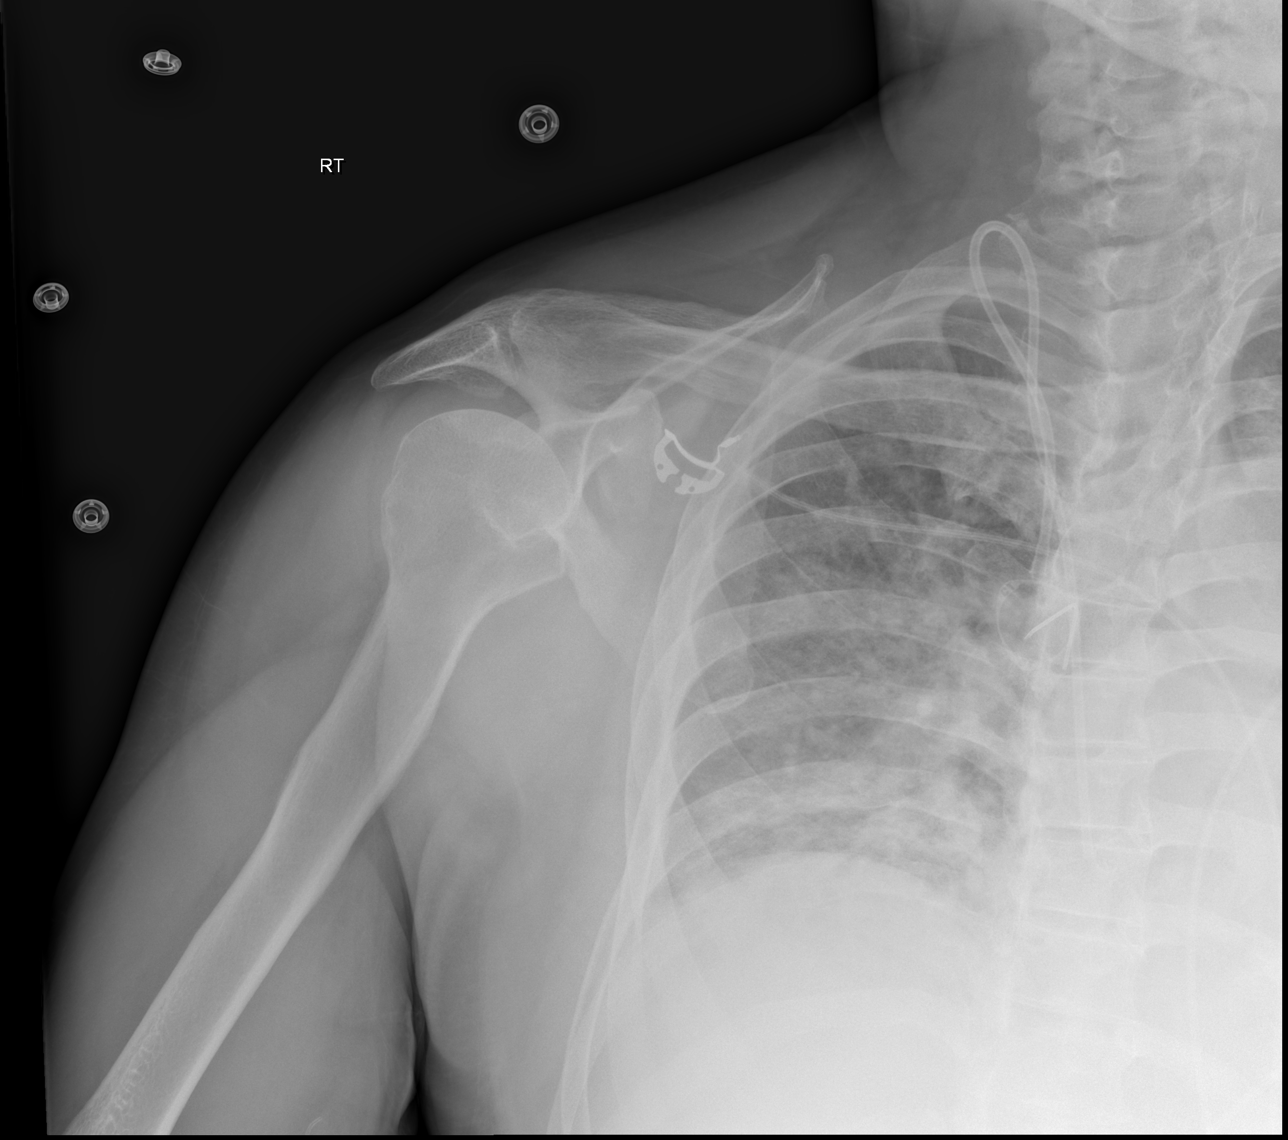

[x scapula y-view right]
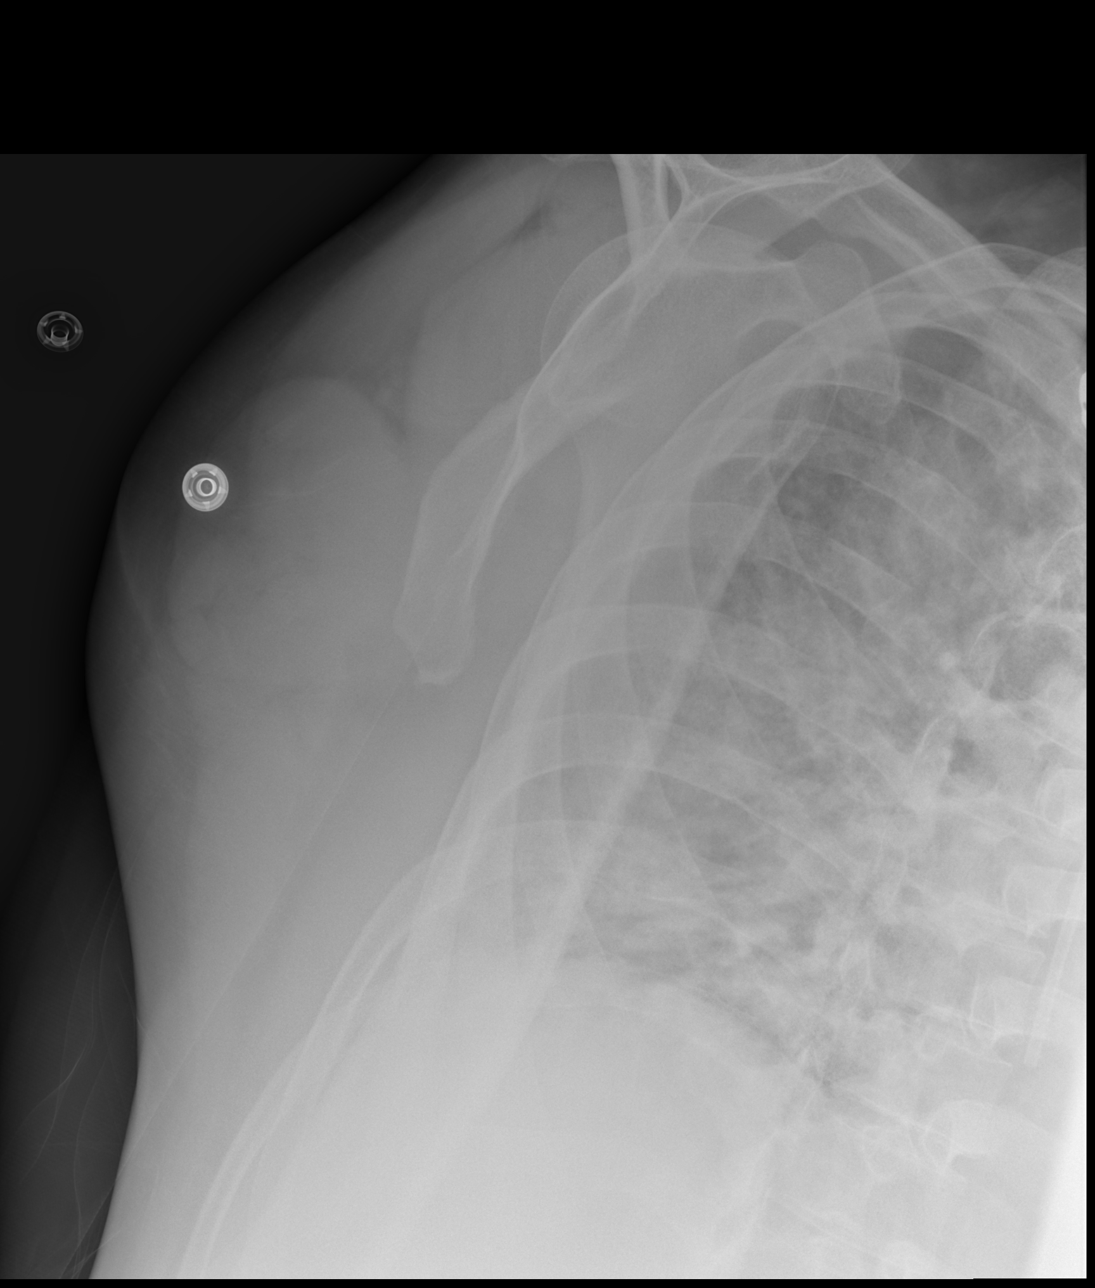

[x shoulder ap right (2 of 2)]
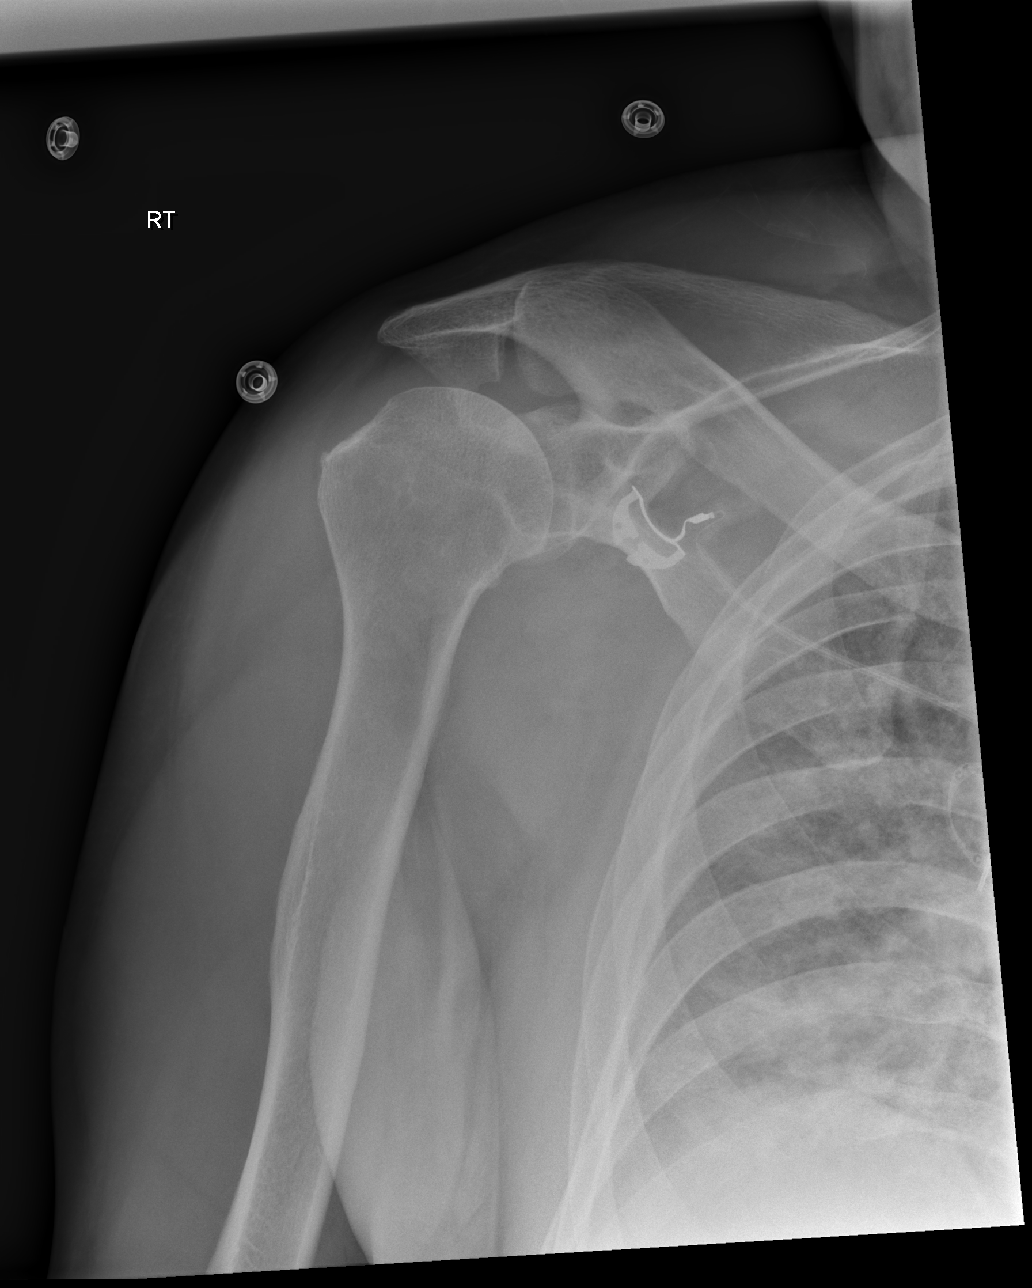

[3 of 3 positions shown; findings below may reference images not displayed]

FINDINGS: There is no evidence of fracture or dislocation. There is no
evidence of arthropathy or other focal bone abnormality. Soft
tissues are unremarkable.
IMPRESSION: Negative.

## 2019-08-12 IMAGING — DX DG CHEST 1V PORT
1 series · 1 of 1 positions shown · non-contrast
Comparison: 03/04/2018

CLINICAL DATA: Fever

EXAM:
PORTABLE CHEST 1 VIEW

[chest ap]
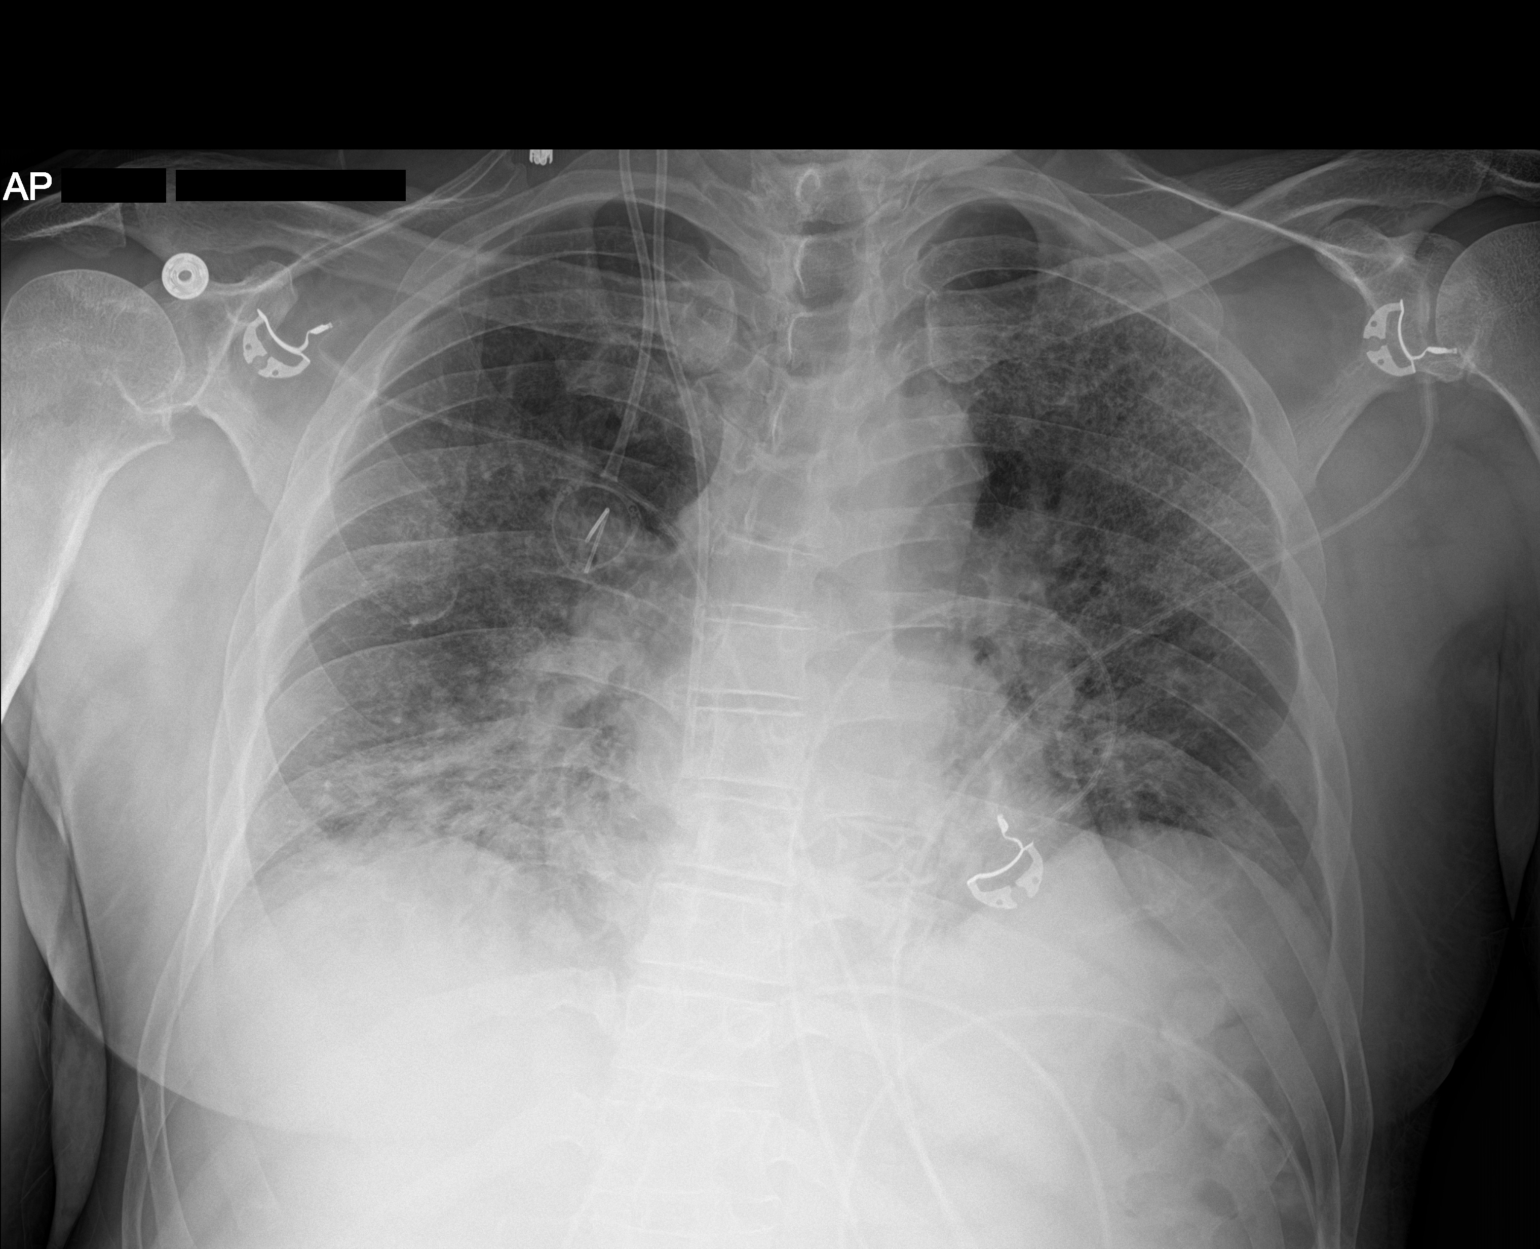

[1 of 1 positions shown; findings below may reference images not displayed]

FINDINGS: Hazy airspace disease in bilateral upper lobes and bilateral lower
lobes most severe in the right lower lobe most concerning for
multilobar pneumonia. No significant pleural effusion or
pneumothorax. Stable cardiomediastinal silhouette. Right-sided
Port-A-Cath with the tip projecting over the cavoatrial junction. No
acute osseous abnormality.
IMPRESSION: Findings concerning for multilobar pneumonia.

## 2019-08-14 IMAGING — MR MR HEAD WO/W CM
11 of 21 series · 29 of 48 positions shown · IV contrast (cc MH)
Comparison: MRI 12/20/2017 and 08/06/2017.

CLINICAL DATA: Multiple sclerosis.  New neurological event.

EXAM:
MRI HEAD WITHOUT AND WITH CONTRAST
TECHNIQUE: Multiplanar, multiecho pulse sequences of the brain and surrounding
structures were obtained without and with intravenous contrast.
CONTRAST:  <See Chart> MULTIHANCE GADOBENATE DIMEGLUMINE 529 MG/ML
IV SOLN

[Series 3: DWI · axial · 3.0mm · 0.94mm/px · z∈[-62,+81]mm · 6 of 97 slices shown (1 of 2)]
[im 1/97]
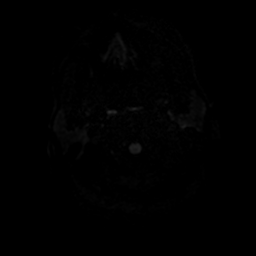
[im 20/97]
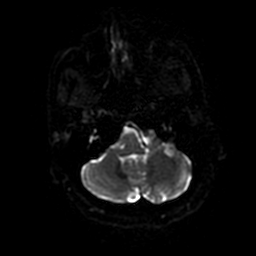
[im 39/97]
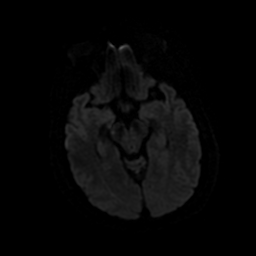
[im 58/97]
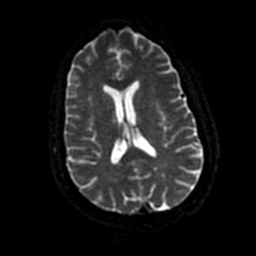
[im 77/97]
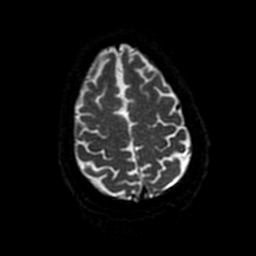
[im 97/97]
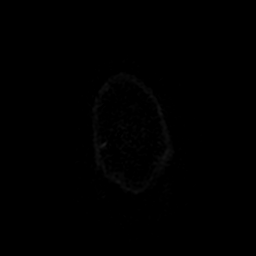

[Series 4: DWI · coronal · 4.0mm · 0.94mm/px · 4 of 70 slices shown (2 of 2)]
[im 1/70]
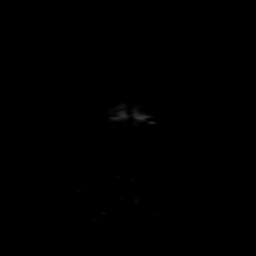
[im 24/70]
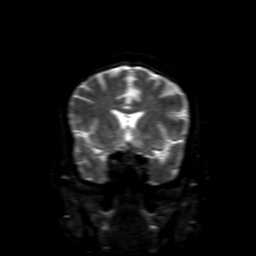
[im 47/70]
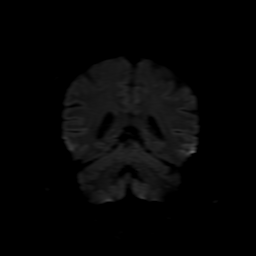
[im 70/70]
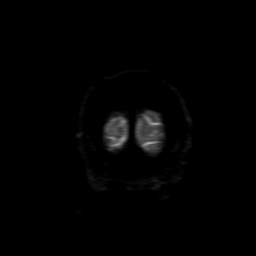

[Series 5: FLAIR · sagittal · 5.0mm · 0.47mm/px · 2 of 23 slices shown (1 of 4)]
[im 1/23]
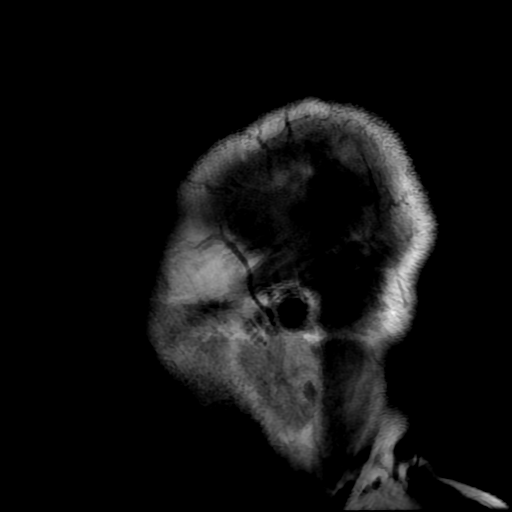
[im 23/23]
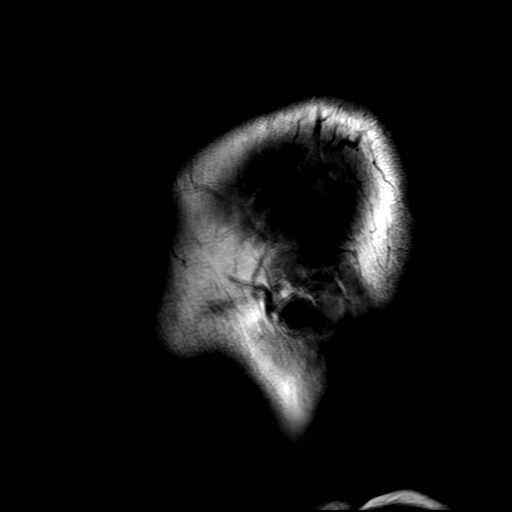

[Series 6: T2 · axial · 5.0mm · 0.47mm/px · 1 of 25 slices shown (1 of 2)]
[im 1/25]
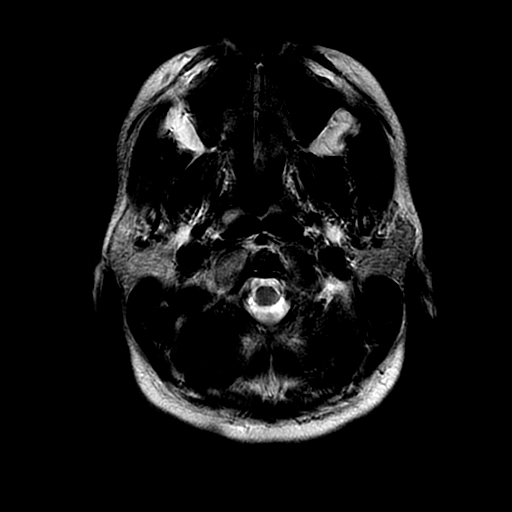

[Series 7: FLAIR · axial · 3.0mm · 0.47mm/px · 1 of 25 slices shown (2 of 4)]
[im 1/25]
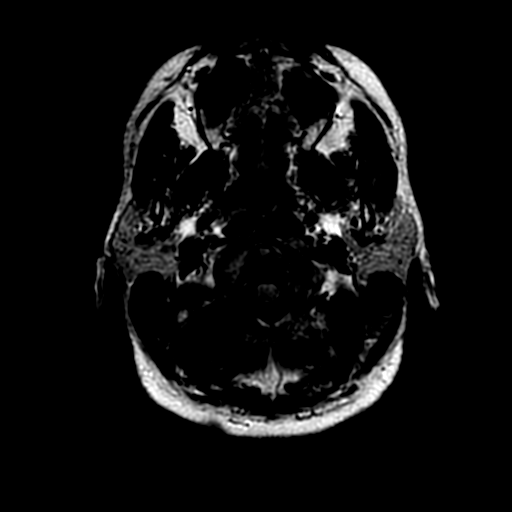

[Series 9: FLAIR · sagittal · 1.6mm · 0.49mm/px · 8 of 192 slices shown (3 of 4)]
[im 1/192]
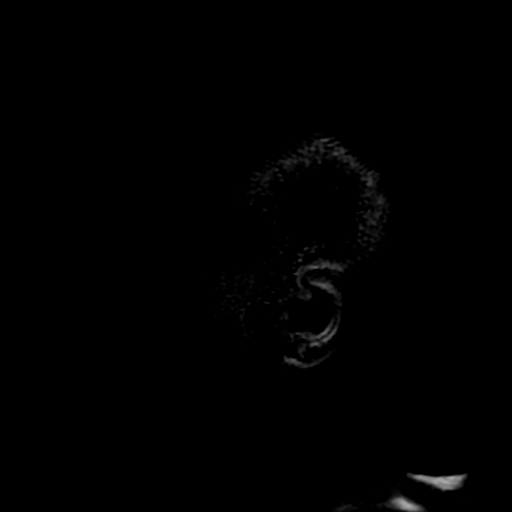
[im 22/192]
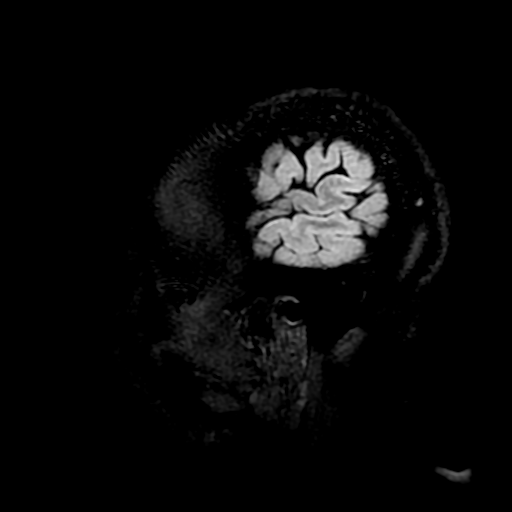
[im 64/192]
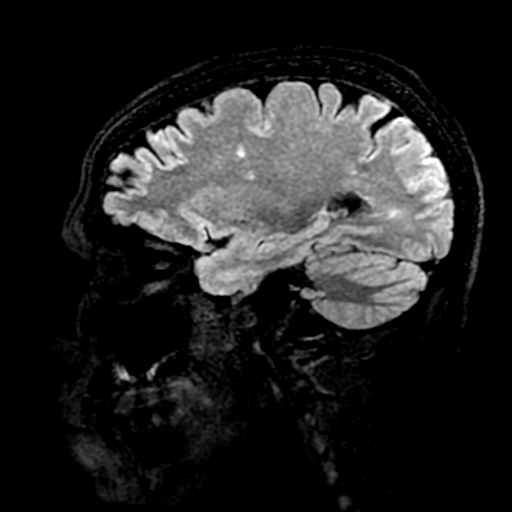
[im 85/192]
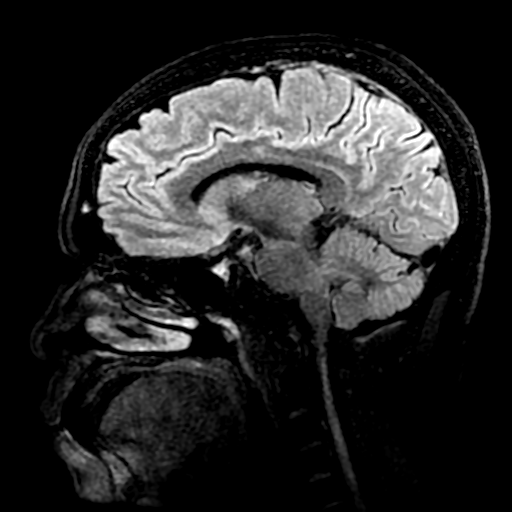
[im 107/192]
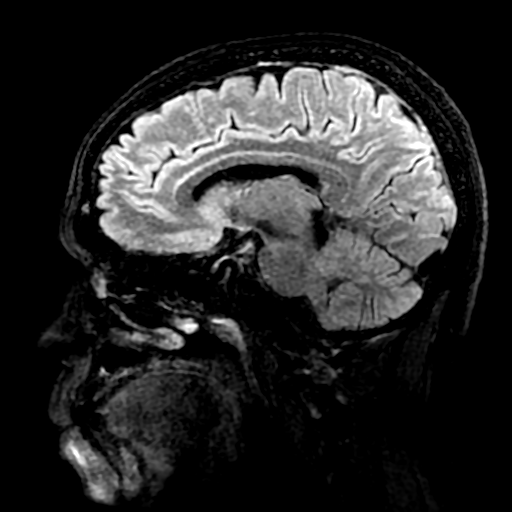
[im 128/192]
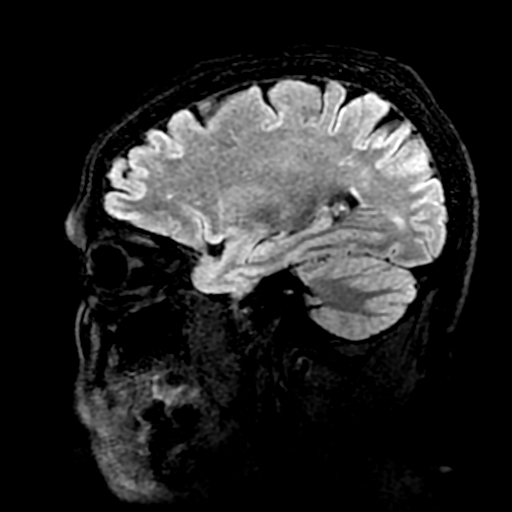
[im 170/192]
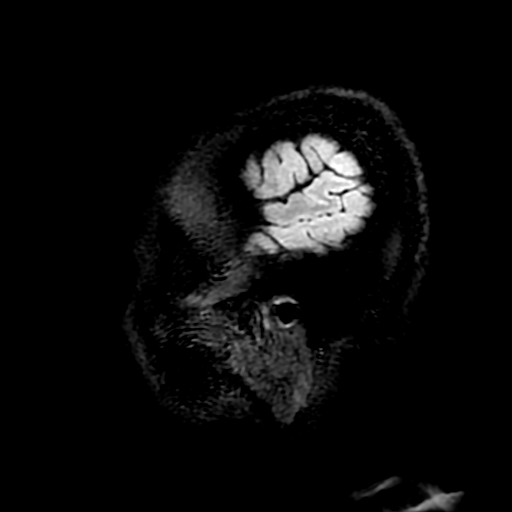
[im 192/192]
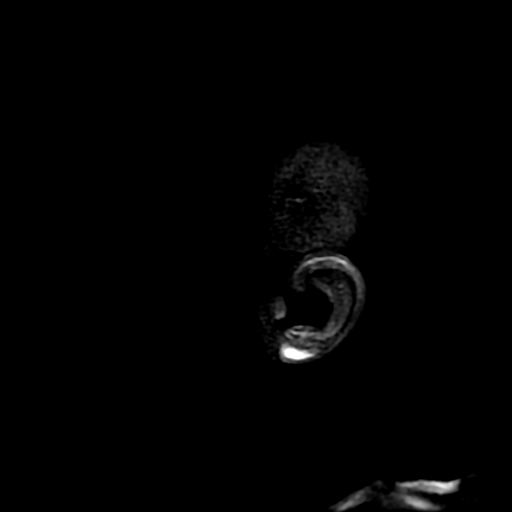

[Series 12: T2 · coronal · 5.0mm · 0.39mm/px · 1 of 29 slices shown (2 of 2)]
[im 1/29]
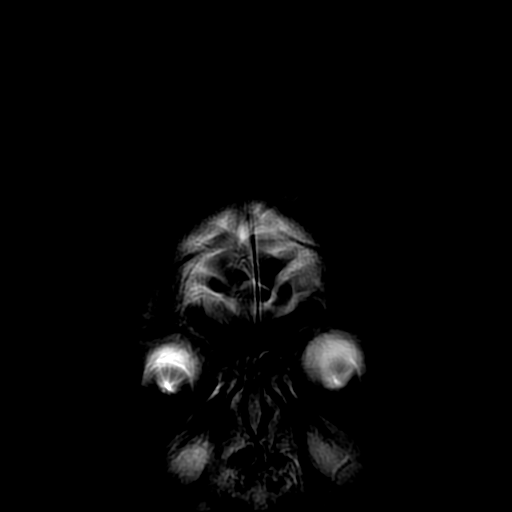

[Series 17: FLAIR · sagittal · 3.0mm · 0.43mm/px · 1 of 17 slices shown (4 of 4)]
[im 1/17]
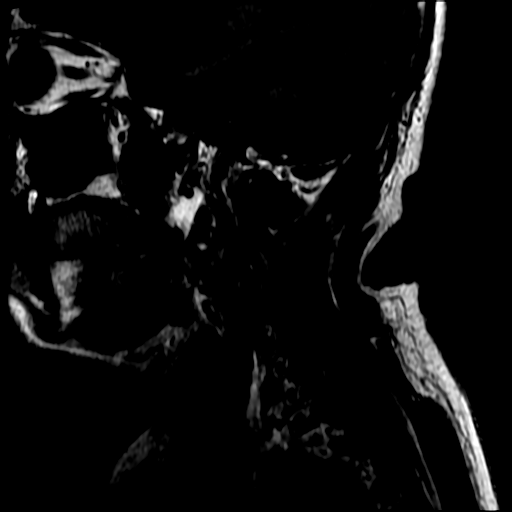

[Series 25: T1 post-contrast · axial · 3.0mm · 0.35mm/px · 1 of 29 slices shown]
[im 1/29]
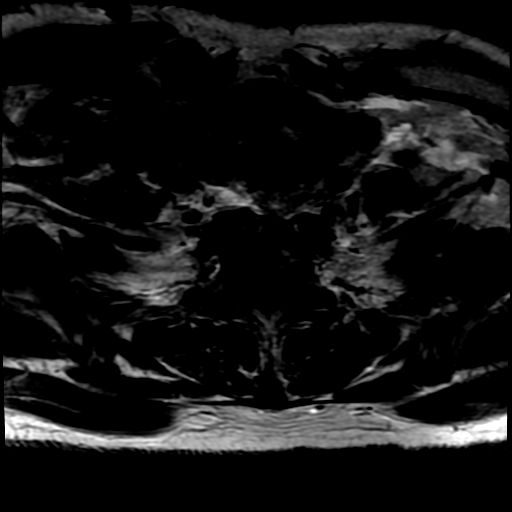

[Series 350: ADC · axial · 3.0mm · 0.94mm/px · z∈[-62,+81]mm · 2 of 48 slices shown (1 of 2)]
[im 1/48]
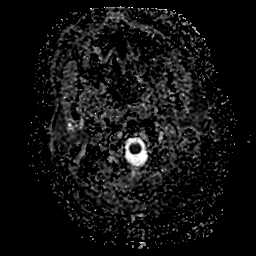
[im 48/48]
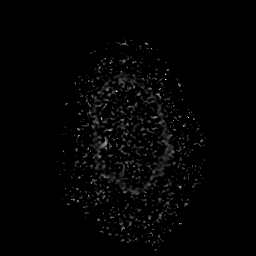

[Series 450: ADC · coronal · 4.0mm · 0.94mm/px · 2 of 35 slices shown (2 of 2)]
[im 1/35]
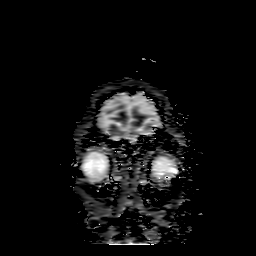
[im 35/35]
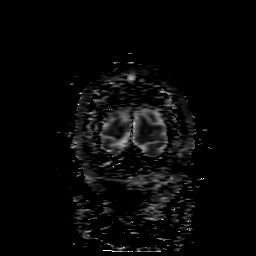

[29 of 48 positions shown; findings below may reference images not displayed]

FINDINGS: Brain: Diffusion imaging does not show any acute or subacute
infarction or other cause of restricted diffusion. Scattered foci of
T2 and FLAIR signal remain evident in the hemispheric white matter,
with a nonspecific pattern. No new or progressive lesions. No
lesions show contrast enhancement. No cortical abnormality. No mass
lesion, hemorrhage, hydrocephalus or extra-axial collection. The
patient has a 7 mm hypoenhancing T1 bright rounded area within the
pituitary gland probably represents a Rathke's cleft cyst. This is
unchanged as far back as 4242.

Vascular: Major vessels at the base of the brain show flow.

Skull and upper cervical spine: Negative

Sinuses/Orbits: Clear/normal

Other: None
IMPRESSION: Stable appearance of the brain. Few scattered punctate nonspecific
T2 and FLAIR bright foci of the hemispheric white matter. No new or
progressive lesions. No abnormal contrast enhancement.

Redemonstration of a 7 mm Rathke's cleft cyst of the pituitary.

## 2019-08-17 ENCOUNTER — Other Ambulatory Visit: Payer: Self-pay

## 2019-08-17 ENCOUNTER — Ambulatory Visit (HOSPITAL_COMMUNITY)
Admission: RE | Admit: 2019-08-17 | Discharge: 2019-08-17 | Disposition: A | Discharge status: Home / Self Care | Payer: 59 | Source: Ambulatory Visit | Attending: Psychiatry | Admitting: Psychiatry

## 2019-08-17 ENCOUNTER — Encounter (HOSPITAL_COMMUNITY): Payer: Self-pay

## 2019-08-17 DIAGNOSIS — G35 Multiple sclerosis: Secondary | ICD-10-CM | POA: Diagnosis present

## 2019-08-17 MED ORDER — ACETAMINOPHEN 325 MG PO TABS
650.0000 mg | ORAL_TABLET | ORAL | Status: DC
  Administered 2019-08-17: 10:00:00 650 mg via ORAL
  Filled 2019-08-17: qty 2

## 2019-08-17 MED ORDER — DIPHENHYDRAMINE HCL 50 MG/ML IJ SOLN
25.0000 mg | INTRAMUSCULAR | Status: DC
  Administered 2019-08-17: 10:00:00 25 mg via INTRAVENOUS
  Filled 2019-08-17: qty 1

## 2019-08-17 MED ORDER — METHYLPREDNISOLONE SODIUM SUCC 125 MG IJ SOLR
125.0000 mg | INTRAMUSCULAR | Status: DC
  Administered 2019-08-17: 10:00:00 125 mg via INTRAVENOUS

## 2019-08-17 MED ORDER — METHYLPREDNISOLONE SODIUM SUCC 125 MG IJ SOLR
INTRAMUSCULAR | Status: AC
  Administered 2019-08-17: 10:00:00 125 mg via INTRAVENOUS
  Filled 2019-08-17: qty 2

## 2019-08-17 MED ORDER — HEPARIN SOD (PORK) LOCK FLUSH 100 UNIT/ML IV SOLN
500.0000 [IU] | INTRAVENOUS | Status: DC | PRN
  Administered 2019-08-17: 14:00:00 500 [IU] via INTRAVENOUS
  Filled 2019-08-17: qty 5

## 2019-08-17 MED ORDER — SODIUM CHLORIDE 0.9 % IV SOLN
1000.0000 mg | INTRAVENOUS | Status: DC
  Administered 2019-08-17: 10:00:00 1000 mg via INTRAVENOUS
  Filled 2019-08-17: qty 100

## 2019-08-17 MED ORDER — SODIUM CHLORIDE 0.9 % IV SOLN
INTRAVENOUS | Status: DC
  Administered 2019-08-17: 10:00:00 via INTRAVENOUS

## 2019-08-17 NOTE — Progress Notes (Signed)
Pt states he has a portacath and it is used for his infusions.  Pt states it was used in Sept.2020 for his Rituxan infusion.  Called Dr. Janalee Dane office to inquire about using the port and to get an order for the Heparin Flush when the infusion is complete.  Called and left message around 10:30am and have not received a call back as of now.

## 2019-08-22 ENCOUNTER — Other Ambulatory Visit: Payer: Self-pay | Admitting: Physician Assistant

## 2019-08-22 DIAGNOSIS — M25561 Pain in right knee: Secondary | ICD-10-CM

## 2019-08-25 ENCOUNTER — Ambulatory Visit
Admission: RE | Admit: 2019-08-25 | Discharge: 2019-08-25 | Disposition: A | Discharge status: Home / Self Care | Payer: 59 | Source: Ambulatory Visit | Attending: Physician Assistant | Admitting: Physician Assistant

## 2019-08-25 ENCOUNTER — Other Ambulatory Visit: Payer: Self-pay

## 2019-08-25 DIAGNOSIS — M25561 Pain in right knee: Secondary | ICD-10-CM

## 2019-09-13 ENCOUNTER — Emergency Department (HOSPITAL_COMMUNITY): Payer: 59

## 2019-09-13 ENCOUNTER — Inpatient Hospital Stay (HOSPITAL_COMMUNITY): Payer: 59

## 2019-09-13 ENCOUNTER — Inpatient Hospital Stay (HOSPITAL_COMMUNITY)
Admission: EM | Admit: 2019-09-13 | Discharge: 2019-10-11 | DRG: 956 | Disposition: E | Payer: 59 | Attending: Student | Admitting: Student

## 2019-09-13 ENCOUNTER — Encounter (HOSPITAL_COMMUNITY): Admission: EM | Disposition: E | Payer: Self-pay | Source: Home / Self Care

## 2019-09-13 ENCOUNTER — Inpatient Hospital Stay (HOSPITAL_COMMUNITY): Payer: 59 | Admitting: Registered Nurse

## 2019-09-13 DIAGNOSIS — Z993 Dependence on wheelchair: Secondary | ICD-10-CM

## 2019-09-13 DIAGNOSIS — S75092A Other specified injury of femoral artery, left leg, initial encounter: Secondary | ICD-10-CM | POA: Diagnosis not present

## 2019-09-13 DIAGNOSIS — R402434 Glasgow coma scale score 3-8, 24 hours or more after hospital admission: Secondary | ICD-10-CM | POA: Diagnosis not present

## 2019-09-13 DIAGNOSIS — S7222XA Displaced subtrochanteric fracture of left femur, initial encounter for closed fracture: Secondary | ICD-10-CM | POA: Diagnosis present

## 2019-09-13 DIAGNOSIS — G35D Multiple sclerosis, unspecified: Secondary | ICD-10-CM

## 2019-09-13 DIAGNOSIS — D62 Acute posthemorrhagic anemia: Secondary | ICD-10-CM

## 2019-09-13 DIAGNOSIS — S72142A Displaced intertrochanteric fracture of left femur, initial encounter for closed fracture: Secondary | ICD-10-CM | POA: Diagnosis present

## 2019-09-13 DIAGNOSIS — S82831B Other fracture of upper and lower end of right fibula, initial encounter for open fracture type I or II: Secondary | ICD-10-CM | POA: Diagnosis present

## 2019-09-13 DIAGNOSIS — S0181XA Laceration without foreign body of other part of head, initial encounter: Secondary | ICD-10-CM | POA: Diagnosis present

## 2019-09-13 DIAGNOSIS — G35 Multiple sclerosis: Secondary | ICD-10-CM | POA: Diagnosis present

## 2019-09-13 DIAGNOSIS — S72142B Displaced intertrochanteric fracture of left femur, initial encounter for open fracture type I or II: Secondary | ICD-10-CM

## 2019-09-13 DIAGNOSIS — R40241 Glasgow coma scale score 13-15, unspecified time: Secondary | ICD-10-CM | POA: Diagnosis present

## 2019-09-13 DIAGNOSIS — E870 Hyperosmolality and hypernatremia: Secondary | ICD-10-CM | POA: Diagnosis present

## 2019-09-13 DIAGNOSIS — Z7189 Other specified counseling: Secondary | ICD-10-CM

## 2019-09-13 DIAGNOSIS — Z20828 Contact with and (suspected) exposure to other viral communicable diseases: Secondary | ICD-10-CM | POA: Diagnosis present

## 2019-09-13 DIAGNOSIS — Z9911 Dependence on respirator [ventilator] status: Secondary | ICD-10-CM | POA: Diagnosis not present

## 2019-09-13 DIAGNOSIS — Z23 Encounter for immunization: Secondary | ICD-10-CM | POA: Diagnosis present

## 2019-09-13 DIAGNOSIS — S82832B Other fracture of upper and lower end of left fibula, initial encounter for open fracture type I or II: Secondary | ICD-10-CM | POA: Diagnosis not present

## 2019-09-13 DIAGNOSIS — Z515 Encounter for palliative care: Secondary | ICD-10-CM | POA: Diagnosis not present

## 2019-09-13 DIAGNOSIS — N5089 Other specified disorders of the male genital organs: Secondary | ICD-10-CM | POA: Diagnosis not present

## 2019-09-13 DIAGNOSIS — Z419 Encounter for procedure for purposes other than remedying health state, unspecified: Secondary | ICD-10-CM

## 2019-09-13 DIAGNOSIS — J9601 Acute respiratory failure with hypoxia: Secondary | ICD-10-CM | POA: Diagnosis present

## 2019-09-13 DIAGNOSIS — R609 Edema, unspecified: Secondary | ICD-10-CM

## 2019-09-13 DIAGNOSIS — S82871A Displaced pilon fracture of right tibia, initial encounter for closed fracture: Secondary | ICD-10-CM | POA: Diagnosis present

## 2019-09-13 DIAGNOSIS — E876 Hypokalemia: Secondary | ICD-10-CM | POA: Diagnosis present

## 2019-09-13 DIAGNOSIS — R188 Other ascites: Secondary | ICD-10-CM | POA: Diagnosis present

## 2019-09-13 DIAGNOSIS — K661 Hemoperitoneum: Secondary | ICD-10-CM | POA: Diagnosis present

## 2019-09-13 DIAGNOSIS — S7292XA Unspecified fracture of left femur, initial encounter for closed fracture: Secondary | ICD-10-CM

## 2019-09-13 DIAGNOSIS — J9600 Acute respiratory failure, unspecified whether with hypoxia or hypercapnia: Secondary | ICD-10-CM

## 2019-09-13 DIAGNOSIS — S32451A Displaced transverse fracture of right acetabulum, initial encounter for closed fracture: Principal | ICD-10-CM

## 2019-09-13 DIAGNOSIS — S728X2B Other fracture of left femur, initial encounter for open fracture type I or II: Secondary | ICD-10-CM

## 2019-09-13 DIAGNOSIS — S32402A Unspecified fracture of left acetabulum, initial encounter for closed fracture: Secondary | ICD-10-CM | POA: Diagnosis present

## 2019-09-13 DIAGNOSIS — S82302B Unspecified fracture of lower end of left tibia, initial encounter for open fracture type I or II: Secondary | ICD-10-CM | POA: Diagnosis not present

## 2019-09-13 DIAGNOSIS — T1490XA Injury, unspecified, initial encounter: Secondary | ICD-10-CM

## 2019-09-13 DIAGNOSIS — S329XXA Fracture of unspecified parts of lumbosacral spine and pelvis, initial encounter for closed fracture: Secondary | ICD-10-CM | POA: Diagnosis present

## 2019-09-13 DIAGNOSIS — S8252XA Displaced fracture of medial malleolus of left tibia, initial encounter for closed fracture: Secondary | ICD-10-CM

## 2019-09-13 DIAGNOSIS — J9602 Acute respiratory failure with hypercapnia: Secondary | ICD-10-CM | POA: Diagnosis not present

## 2019-09-13 DIAGNOSIS — S7221XA Displaced subtrochanteric fracture of right femur, initial encounter for closed fracture: Secondary | ICD-10-CM | POA: Diagnosis present

## 2019-09-13 DIAGNOSIS — S82871B Displaced pilon fracture of right tibia, initial encounter for open fracture type I or II: Secondary | ICD-10-CM

## 2019-09-13 DIAGNOSIS — Z66 Do not resuscitate: Secondary | ICD-10-CM | POA: Diagnosis not present

## 2019-09-13 DIAGNOSIS — S025XXA Fracture of tooth (traumatic), initial encounter for closed fracture: Secondary | ICD-10-CM | POA: Diagnosis present

## 2019-09-13 DIAGNOSIS — R578 Other shock: Secondary | ICD-10-CM | POA: Diagnosis present

## 2019-09-13 DIAGNOSIS — T794XXA Traumatic shock, initial encounter: Secondary | ICD-10-CM | POA: Diagnosis present

## 2019-09-13 DIAGNOSIS — Y92410 Unspecified street and highway as the place of occurrence of the external cause: Secondary | ICD-10-CM | POA: Diagnosis not present

## 2019-09-13 DIAGNOSIS — E872 Acidosis: Secondary | ICD-10-CM | POA: Diagnosis present

## 2019-09-13 DIAGNOSIS — D696 Thrombocytopenia, unspecified: Secondary | ICD-10-CM | POA: Diagnosis not present

## 2019-09-13 DIAGNOSIS — Z978 Presence of other specified devices: Secondary | ICD-10-CM

## 2019-09-13 DIAGNOSIS — G8911 Acute pain due to trauma: Secondary | ICD-10-CM | POA: Diagnosis present

## 2019-09-13 DIAGNOSIS — J9690 Respiratory failure, unspecified, unspecified whether with hypoxia or hypercapnia: Secondary | ICD-10-CM

## 2019-09-13 DIAGNOSIS — S01511A Laceration without foreign body of lip, initial encounter: Secondary | ICD-10-CM | POA: Diagnosis present

## 2019-09-13 DIAGNOSIS — N179 Acute kidney failure, unspecified: Secondary | ICD-10-CM | POA: Diagnosis present

## 2019-09-13 DIAGNOSIS — S82892A Other fracture of left lower leg, initial encounter for closed fracture: Secondary | ICD-10-CM

## 2019-09-13 DIAGNOSIS — S82002B Unspecified fracture of left patella, initial encounter for open fracture type I or II: Secondary | ICD-10-CM | POA: Insufficient documentation

## 2019-09-13 DIAGNOSIS — Z86718 Personal history of other venous thrombosis and embolism: Secondary | ICD-10-CM

## 2019-09-13 DIAGNOSIS — Z818 Family history of other mental and behavioral disorders: Secondary | ICD-10-CM

## 2019-09-13 DIAGNOSIS — S82872B Displaced pilon fracture of left tibia, initial encounter for open fracture type I or II: Secondary | ICD-10-CM | POA: Diagnosis present

## 2019-09-13 DIAGNOSIS — S82891A Other fracture of right lower leg, initial encounter for closed fracture: Secondary | ICD-10-CM

## 2019-09-13 DIAGNOSIS — T148XXA Other injury of unspecified body region, initial encounter: Secondary | ICD-10-CM | POA: Diagnosis not present

## 2019-09-13 DIAGNOSIS — S728X1B Other fracture of right femur, initial encounter for open fracture type I or II: Secondary | ICD-10-CM

## 2019-09-13 DIAGNOSIS — I723 Aneurysm of iliac artery: Secondary | ICD-10-CM | POA: Diagnosis present

## 2019-09-13 DIAGNOSIS — S82832A Other fracture of upper and lower end of left fibula, initial encounter for closed fracture: Secondary | ICD-10-CM | POA: Diagnosis present

## 2019-09-13 HISTORY — PX: EXTERNAL FIXATION LEG: SHX1549

## 2019-09-13 HISTORY — PX: I&D EXTREMITY: SHX5045

## 2019-09-13 HISTORY — PX: FEMORAL ARTERY EXPLORATION: SHX5160

## 2019-09-13 LAB — I-STAT CHEM 8, ED
BUN: 9 mg/dL (ref 6–20)
Calcium, Ion: 1.11 mmol/L — ABNORMAL LOW (ref 1.15–1.40)
Chloride: 111 mmol/L (ref 98–111)
Creatinine, Ser: 1.2 mg/dL (ref 0.61–1.24)
Glucose, Bld: 146 mg/dL — ABNORMAL HIGH (ref 70–99)
HCT: 39 % (ref 39.0–52.0)
Hemoglobin: 13.3 g/dL (ref 13.0–17.0)
Potassium: 3.7 mmol/L (ref 3.5–5.1)
Sodium: 145 mmol/L (ref 135–145)
TCO2: 17 mmol/L — ABNORMAL LOW (ref 22–32)

## 2019-09-13 LAB — POCT I-STAT 7, (LYTES, BLD GAS, ICA,H+H)
Acid-base deficit: 10 mmol/L — ABNORMAL HIGH (ref 0.0–2.0)
Acid-base deficit: 10 mmol/L — ABNORMAL HIGH (ref 0.0–2.0)
Acid-base deficit: 10 mmol/L — ABNORMAL HIGH (ref 0.0–2.0)
Acid-base deficit: 14 mmol/L — ABNORMAL HIGH (ref 0.0–2.0)
Acid-base deficit: 17 mmol/L — ABNORMAL HIGH (ref 0.0–2.0)
Bicarbonate: 15.6 mmol/L — ABNORMAL LOW (ref 20.0–28.0)
Bicarbonate: 16.4 mmol/L — ABNORMAL LOW (ref 20.0–28.0)
Bicarbonate: 19 mmol/L — ABNORMAL LOW (ref 20.0–28.0)
Bicarbonate: 20.8 mmol/L (ref 20.0–28.0)
Bicarbonate: 21.3 mmol/L (ref 20.0–28.0)
Calcium, Ion: 0.49 mmol/L — CL (ref 1.15–1.40)
Calcium, Ion: 0.96 mmol/L — ABNORMAL LOW (ref 1.15–1.40)
Calcium, Ion: 1.07 mmol/L — ABNORMAL LOW (ref 1.15–1.40)
Calcium, Ion: 1.08 mmol/L — ABNORMAL LOW (ref 1.15–1.40)
Calcium, Ion: 1.19 mmol/L (ref 1.15–1.40)
HCT: 23 % — ABNORMAL LOW (ref 39.0–52.0)
HCT: 27 % — ABNORMAL LOW (ref 39.0–52.0)
HCT: 32 % — ABNORMAL LOW (ref 39.0–52.0)
HCT: 35 % — ABNORMAL LOW (ref 39.0–52.0)
HCT: 36 % — ABNORMAL LOW (ref 39.0–52.0)
Hemoglobin: 10.9 g/dL — ABNORMAL LOW (ref 13.0–17.0)
Hemoglobin: 11.9 g/dL — ABNORMAL LOW (ref 13.0–17.0)
Hemoglobin: 12.2 g/dL — ABNORMAL LOW (ref 13.0–17.0)
Hemoglobin: 7.8 g/dL — ABNORMAL LOW (ref 13.0–17.0)
Hemoglobin: 9.2 g/dL — ABNORMAL LOW (ref 13.0–17.0)
O2 Saturation: 66 %
O2 Saturation: 66 %
O2 Saturation: 74 %
O2 Saturation: 86 %
O2 Saturation: 97 %
Patient temperature: 35
Patient temperature: 35.5
Patient temperature: 35.9
Patient temperature: 96.4
Patient temperature: 98
Potassium: 2.9 mmol/L — ABNORMAL LOW (ref 3.5–5.1)
Potassium: 3 mmol/L — ABNORMAL LOW (ref 3.5–5.1)
Potassium: 3.2 mmol/L — ABNORMAL LOW (ref 3.5–5.1)
Potassium: 3.6 mmol/L (ref 3.5–5.1)
Potassium: 5.3 mmol/L — ABNORMAL HIGH (ref 3.5–5.1)
Sodium: 145 mmol/L (ref 135–145)
Sodium: 147 mmol/L — ABNORMAL HIGH (ref 135–145)
Sodium: 148 mmol/L — ABNORMAL HIGH (ref 135–145)
Sodium: 150 mmol/L — ABNORMAL HIGH (ref 135–145)
Sodium: 151 mmol/L — ABNORMAL HIGH (ref 135–145)
TCO2: 18 mmol/L — ABNORMAL LOW (ref 22–32)
TCO2: 18 mmol/L — ABNORMAL LOW (ref 22–32)
TCO2: 21 mmol/L — ABNORMAL LOW (ref 22–32)
TCO2: 23 mmol/L (ref 22–32)
TCO2: 24 mmol/L (ref 22–32)
pCO2 arterial: 52.8 mmHg — ABNORMAL HIGH (ref 32.0–48.0)
pCO2 arterial: 60.7 mmHg — ABNORMAL HIGH (ref 32.0–48.0)
pCO2 arterial: 67.7 mmHg (ref 32.0–48.0)
pCO2 arterial: 68.4 mmHg (ref 32.0–48.0)
pCO2 arterial: 68.6 mmHg (ref 32.0–48.0)
pH, Arterial: 6.962 — CL (ref 7.350–7.450)
pH, Arterial: 7.028 — CL (ref 7.350–7.450)
pH, Arterial: 7.087 — CL (ref 7.350–7.450)
pH, Arterial: 7.089 — CL (ref 7.350–7.450)
pH, Arterial: 7.155 — CL (ref 7.350–7.450)
pO2, Arterial: 134 mmHg — ABNORMAL HIGH (ref 83.0–108.0)
pO2, Arterial: 42 mmHg — ABNORMAL LOW (ref 83.0–108.0)
pO2, Arterial: 47 mmHg — ABNORMAL LOW (ref 83.0–108.0)
pO2, Arterial: 58 mmHg — ABNORMAL LOW (ref 83.0–108.0)
pO2, Arterial: 61 mmHg — ABNORMAL LOW (ref 83.0–108.0)

## 2019-09-13 LAB — CBC
HCT: 40.8 % (ref 39.0–52.0)
HCT: 33.4 % — ABNORMAL LOW (ref 39.0–52.0)
Hemoglobin: 12.8 g/dL — ABNORMAL LOW (ref 13.0–17.0)
Hemoglobin: 10.4 g/dL — ABNORMAL LOW (ref 13.0–17.0)
MCH: 28.2 pg (ref 26.0–34.0)
MCH: 29.1 pg (ref 26.0–34.0)
MCHC: 31.4 g/dL (ref 30.0–36.0)
MCHC: 31.1 g/dL (ref 30.0–36.0)
MCV: 89.9 fL (ref 80.0–100.0)
MCV: 93.6 fL (ref 80.0–100.0)
Platelets: 254 10*3/uL (ref 150–400)
Platelets: 128 10*3/uL — ABNORMAL LOW (ref 150–400)
RBC: 4.54 MIL/uL (ref 4.22–5.81)
RBC: 3.57 MIL/uL — ABNORMAL LOW (ref 4.22–5.81)
RDW: 15.1 % (ref 11.5–15.5)
RDW: 14.1 % (ref 11.5–15.5)
WBC: 6.3 10*3/uL (ref 4.0–10.5)
WBC: 12.2 10*3/uL — ABNORMAL HIGH (ref 4.0–10.5)
nRBC: 0 % (ref 0.0–0.2)
nRBC: 0 % (ref 0.0–0.2)

## 2019-09-13 LAB — CDS SEROLOGY

## 2019-09-13 LAB — APTT: aPTT: 46 seconds — ABNORMAL HIGH (ref 24–36)

## 2019-09-13 LAB — COMPREHENSIVE METABOLIC PANEL
ALT: 58 U/L — ABNORMAL HIGH (ref 0–44)
AST: 83 U/L — ABNORMAL HIGH (ref 15–41)
Albumin: 3.2 g/dL — ABNORMAL LOW (ref 3.5–5.0)
Alkaline Phosphatase: 102 U/L (ref 38–126)
Anion gap: 13 (ref 5–15)
BUN: 9 mg/dL (ref 6–20)
CO2: 17 mmol/L — ABNORMAL LOW (ref 22–32)
Calcium: 8.1 mg/dL — ABNORMAL LOW (ref 8.9–10.3)
Chloride: 112 mmol/L — ABNORMAL HIGH (ref 98–111)
Creatinine, Ser: 1.35 mg/dL — ABNORMAL HIGH (ref 0.61–1.24)
GFR calc Af Amer: >60 mL/min (ref >60)
GFR calc non Af Amer: 57 mL/min — ABNORMAL LOW (ref >60)
Glucose, Bld: 160 mg/dL — ABNORMAL HIGH (ref 70–99)
Potassium: 3.8 mmol/L (ref 3.5–5.1)
Sodium: 142 mmol/L (ref 135–145)
Total Bilirubin: 0.2 mg/dL — ABNORMAL LOW (ref 0.3–1.2)
Total Protein: 5.7 g/dL — ABNORMAL LOW (ref 6.5–8.1)

## 2019-09-13 LAB — URINALYSIS, ROUTINE W REFLEX MICROSCOPIC
Bacteria, UA: NONE SEEN
Bilirubin Urine: NEGATIVE
Glucose, UA: 50 mg/dL — AB
Ketones, ur: NEGATIVE mg/dL
Leukocytes,Ua: NEGATIVE
Nitrite: NEGATIVE
Protein, ur: 30 mg/dL — AB
Specific Gravity, Urine: 1.012 (ref 1.005–1.030)
pH: 5 (ref 5.0–8.0)

## 2019-09-13 LAB — LACTIC ACID, PLASMA: Lactic Acid, Venous: 3.2 mmol/L (ref 0.5–1.9)

## 2019-09-13 LAB — BASIC METABOLIC PANEL
Anion gap: 9 (ref 5–15)
BUN: 8 mg/dL (ref 6–20)
CO2: 18 mmol/L — ABNORMAL LOW (ref 22–32)
Calcium: 6.7 mg/dL — ABNORMAL LOW (ref 8.9–10.3)
Chloride: 114 mmol/L — ABNORMAL HIGH (ref 98–111)
Creatinine, Ser: 1.43 mg/dL — ABNORMAL HIGH (ref 0.61–1.24)
GFR calc Af Amer: >60 mL/min (ref >60)
GFR calc non Af Amer: 54 mL/min — ABNORMAL LOW (ref >60)
Glucose, Bld: 267 mg/dL — ABNORMAL HIGH (ref 70–99)
Potassium: 3.3 mmol/L — ABNORMAL LOW (ref 3.5–5.1)
Sodium: 141 mmol/L (ref 135–145)

## 2019-09-13 LAB — PLATELET COUNT: Platelets: 47 10*3/uL — ABNORMAL LOW (ref 150–400)

## 2019-09-13 LAB — PROTIME-INR
INR: 1.3 — ABNORMAL HIGH (ref 0.8–1.2)
INR: 1.6 — ABNORMAL HIGH (ref 0.8–1.2)
Prothrombin Time: 16.2 seconds — ABNORMAL HIGH (ref 11.4–15.2)
Prothrombin Time: 19.1 seconds — ABNORMAL HIGH (ref 11.4–15.2)

## 2019-09-13 LAB — ABO/RH: ABO/RH(D): B POS

## 2019-09-13 LAB — FIBRINOGEN: Fibrinogen: 212 mg/dL (ref 210–475)

## 2019-09-13 LAB — MRSA PCR SCREENING: MRSA by PCR: NEGATIVE

## 2019-09-13 LAB — HEMOGLOBIN AND HEMATOCRIT, BLOOD
HCT: 40.5 % (ref 39.0–52.0)
Hemoglobin: 13.1 g/dL (ref 13.0–17.0)

## 2019-09-13 LAB — ETHANOL: Alcohol, Ethyl (B): <10 mg/dL (ref <10)

## 2019-09-13 LAB — SARS CORONAVIRUS 2 BY RT PCR (HOSPITAL ORDER, PERFORMED IN ~~LOC~~ HOSPITAL LAB): SARS Coronavirus 2: NEGATIVE

## 2019-09-13 SURGERY — EXTERNAL FIXATION, LOWER EXTREMITY
Anesthesia: General | Site: Leg Upper | Laterality: Left

## 2019-09-13 MED ORDER — FENTANYL 2500MCG IN NS 250ML (10MCG/ML) PREMIX INFUSION
200.0000 ug/h | INTRAVENOUS | Status: DC
  Administered 2019-09-13: 100 ug/h via INTRAVENOUS
  Administered 2019-09-14: 150 ug/h via INTRAVENOUS
  Administered 2019-09-15: 175 ug/h via INTRAVENOUS
  Administered 2019-09-15 – 2019-09-16 (×3): 200 ug/h via INTRAVENOUS
  Administered 2019-09-16 – 2019-09-17 (×3): 300 ug/h via INTRAVENOUS
  Administered 2019-09-18: 250 ug/h via INTRAVENOUS
  Administered 2019-09-18: 300 ug/h via INTRAVENOUS
  Administered 2019-09-18: 250 ug/h via INTRAVENOUS
  Administered 2019-09-19: 125 ug/h via INTRAVENOUS
  Administered 2019-09-19: 250 ug/h via INTRAVENOUS
  Administered 2019-09-21: 300 ug/h via INTRAVENOUS
  Administered 2019-09-21: 200 ug/h via INTRAVENOUS
  Administered 2019-09-22: 100 ug/h via INTRAVENOUS
  Administered 2019-09-22: 300 ug/h via INTRAVENOUS
  Administered 2019-09-23: 100 ug/h via INTRAVENOUS
  Administered 2019-09-24: 09:00:00 150 ug/h via INTRAVENOUS
  Administered 2019-09-24: 200 ug/h via INTRAVENOUS
  Administered 2019-09-25: 400 ug/h via INTRAVENOUS
  Administered 2019-09-25: 200 ug/h via INTRAVENOUS
  Administered 2019-09-25: 225 ug/h via INTRAVENOUS
  Administered 2019-09-26 – 2019-09-27 (×6): 400 ug/h via INTRAVENOUS
  Filled 2019-09-13 (×30): qty 250

## 2019-09-13 MED ORDER — CALCIUM CHLORIDE 10 % IV SOLN
INTRAVENOUS | Status: DC | PRN
  Administered 2019-09-13: 500 mg via INTRAVENOUS
  Administered 2019-09-13: 300 mg via INTRAVENOUS
  Administered 2019-09-13: 500 mg via INTRAVENOUS
  Administered 2019-09-13: 300 mg via INTRAVENOUS
  Administered 2019-09-13 (×2): 200 mg via INTRAVENOUS

## 2019-09-13 MED ORDER — EPINEPHRINE HCL 5 MG/250ML IV SOLN IN NS
INTRAVENOUS | Status: DC | PRN
  Administered 2019-09-13: 4 ug/min via INTRAVENOUS

## 2019-09-13 MED ORDER — PIPERACILLIN-TAZOBACTAM 3.375 G IVPB 30 MIN: 3.3750 g | Freq: Four times a day (QID) | INTRAVENOUS | Status: DC

## 2019-09-13 MED ORDER — CALCIUM CHLORIDE 10 % IV SOLN
INTRAVENOUS | Status: AC
  Filled 2019-09-13: qty 20

## 2019-09-13 MED ORDER — EPHEDRINE 5 MG/ML INJ
INTRAVENOUS | Status: AC
  Filled 2019-09-13: qty 10

## 2019-09-13 MED ORDER — FENTANYL 2500MCG IN NS 250ML (10MCG/ML) PREMIX INFUSION
20.0000 ug/h | INTRAVENOUS | Status: DC
  Filled 2019-09-13: qty 250

## 2019-09-13 MED ORDER — CEFAZOLIN SODIUM-DEXTROSE 2-4 GM/100ML-% IV SOLN
2.0000 g | Freq: Once | INTRAVENOUS | Status: AC
  Administered 2019-09-13: 2 g via INTRAVENOUS

## 2019-09-13 MED ORDER — ALBUMIN HUMAN 5 % IV SOLN
INTRAVENOUS | Status: DC | PRN
  Administered 2019-09-13: 22:00:00 via INTRAVENOUS

## 2019-09-13 MED ORDER — EPINEPHRINE 1 MG/10ML IJ SOSY
PREFILLED_SYRINGE | INTRAMUSCULAR | Status: AC
  Filled 2019-09-13: qty 20

## 2019-09-13 MED ORDER — SODIUM CHLORIDE 0.9 % IV SOLN
INTRAVENOUS | Status: DC | PRN
  Administered 2019-09-13: 22:00:00

## 2019-09-13 MED ORDER — FUROSEMIDE 10 MG/ML IJ SOLN
INTRAMUSCULAR | Status: AC
  Filled 2019-09-13: qty 4

## 2019-09-13 MED ORDER — ALBUMIN HUMAN 5 % IV SOLN
INTRAVENOUS | Status: DC | PRN
  Administered 2019-09-13 (×2): via INTRAVENOUS

## 2019-09-13 MED ORDER — CEFAZOLIN SODIUM 1 G IJ SOLR
INTRAMUSCULAR | Status: AC
  Filled 2019-09-13: qty 20

## 2019-09-13 MED ORDER — SODIUM CHLORIDE 0.9 % IR SOLN
Status: DC | PRN
  Administered 2019-09-13 (×2): 6000 mL
  Administered 2019-09-13 (×2): 3000 mL

## 2019-09-13 MED ORDER — 0.9 % SODIUM CHLORIDE (POUR BTL) OPTIME
TOPICAL | Status: DC | PRN
  Administered 2019-09-13: 20:00:00 1000 mL

## 2019-09-13 MED ORDER — SODIUM CHLORIDE (PF) 0.9 % IJ SOLN
INTRAMUSCULAR | Status: AC
  Filled 2019-09-13: qty 10

## 2019-09-13 MED ORDER — FENTANYL CITRATE (PF) 250 MCG/5ML IJ SOLN
INTRAMUSCULAR | Status: AC
  Filled 2019-09-13: qty 5

## 2019-09-13 MED ORDER — MIDAZOLAM 50MG/50ML (1MG/ML) PREMIX INFUSION
1.0000 mg/h | INTRAVENOUS | Status: DC
  Administered 2019-09-13: 1 mg/h via INTRAVENOUS
  Administered 2019-09-14 – 2019-09-16 (×4): 4 mg/h via INTRAVENOUS
  Administered 2019-09-16: 6 mg/h via INTRAVENOUS
  Administered 2019-09-17 (×2): 5 mg/h via INTRAVENOUS
  Administered 2019-09-18: 3 mg/h via INTRAVENOUS
  Administered 2019-09-18: 5 mg/h via INTRAVENOUS
  Administered 2019-09-19: 3 mg/h via INTRAVENOUS
  Filled 2019-09-13 (×10): qty 50

## 2019-09-13 MED ORDER — LACTATED RINGERS IV SOLN
INTRAVENOUS | Status: DC | PRN
  Administered 2019-09-13 (×2): via INTRAVENOUS

## 2019-09-13 MED ORDER — CEFAZOLIN SODIUM-DEXTROSE 2-4 GM/100ML-% IV SOLN: 2.0000 g | INTRAVENOUS | Status: DC

## 2019-09-13 MED ORDER — SODIUM CHLORIDE 0.9 % IV SOLN
INTRAVENOUS | Status: AC
  Filled 2019-09-13: qty 1.2

## 2019-09-13 MED ORDER — SODIUM BICARBONATE 8.4 % IV SOLN
INTRAVENOUS | Status: DC | PRN
  Administered 2019-09-13 (×3): 50 meq via INTRAVENOUS

## 2019-09-13 MED ORDER — PHENYLEPHRINE 40 MCG/ML (10ML) SYRINGE FOR IV PUSH (FOR BLOOD PRESSURE SUPPORT)
PREFILLED_SYRINGE | INTRAVENOUS | Status: AC
  Filled 2019-09-13: qty 10

## 2019-09-13 MED ORDER — TETANUS-DIPHTH-ACELL PERTUSSIS 5-2.5-18.5 LF-MCG/0.5 IM SUSP
0.5000 mL | Freq: Once | INTRAMUSCULAR | Status: AC
  Administered 2019-09-13: 18:00:00 0.5 mL via INTRAMUSCULAR
  Filled 2019-09-13: qty 0.5

## 2019-09-13 MED ORDER — ROCURONIUM BROMIDE 50 MG/5ML IV SOLN
INTRAVENOUS | Status: AC | PRN
  Administered 2019-09-13: 100 mg via INTRAVENOUS
  Administered 2019-09-13: 20 mg via INTRAVENOUS
  Administered 2019-09-13: 100 mg via INTRAVENOUS

## 2019-09-13 MED ORDER — ETOMIDATE 2 MG/ML IV SOLN
INTRAVENOUS | Status: AC | PRN
  Administered 2019-09-13 (×3): 20 mg via INTRAVENOUS

## 2019-09-13 MED ORDER — PENICILLIN G POTASSIUM 5000000 UNITS IJ SOLR
2.0000 10*6.[IU] | INTRAVENOUS | Status: DC
  Filled 2019-09-13: qty 2

## 2019-09-13 MED ORDER — FENTANYL CITRATE (PF) 100 MCG/2ML IJ SOLN
INTRAMUSCULAR | Status: DC | PRN
  Administered 2019-09-13 (×2): 100 ug via INTRAVENOUS
  Administered 2019-09-13: 50 ug via INTRAVENOUS

## 2019-09-13 MED ORDER — ALBUMIN HUMAN 5 % IV SOLN: INTRAVENOUS | Status: DC | PRN

## 2019-09-13 MED ORDER — LIDOCAINE 2% (20 MG/ML) 5 ML SYRINGE
INTRAMUSCULAR | Status: AC
  Filled 2019-09-13: qty 5

## 2019-09-13 MED ORDER — ROCURONIUM BROMIDE 10 MG/ML (PF) SYRINGE
PREFILLED_SYRINGE | INTRAVENOUS | Status: AC
  Filled 2019-09-13: qty 40

## 2019-09-13 MED ORDER — FUROSEMIDE 10 MG/ML IJ SOLN
INTRAMUSCULAR | Status: DC | PRN
  Administered 2019-09-13: 20 mg via INTRAMUSCULAR

## 2019-09-13 MED ORDER — NOREPINEPHRINE 4 MG/250ML-% IV SOLN
0.0000 ug/min | INTRAVENOUS | Status: DC
  Administered 2019-09-13: 5 ug/min via INTRAVENOUS
  Administered 2019-09-13: 19:00:00 80 ug/min via INTRAVENOUS
  Administered 2019-09-14: 35 ug/min via INTRAVENOUS
  Administered 2019-09-14: 20:00:00 36 ug/min via INTRAVENOUS
  Administered 2019-09-14: 17:00:00 34 ug/min via INTRAVENOUS
  Administered 2019-09-14: 11:00:00 40 ug/min via INTRAVENOUS
  Administered 2019-09-14: 07:00:00 5.333 ug/min via INTRAVENOUS
  Administered 2019-09-14: 15:00:00 36 ug/min via INTRAVENOUS
  Administered 2019-09-14: 22:00:00 30 ug/min via INTRAVENOUS
  Administered 2019-09-14: 28 ug/min via INTRAVENOUS
  Administered 2019-09-15: 14:00:00 21 ug/min via INTRAVENOUS
  Administered 2019-09-15: 25 ug/min via INTRAVENOUS
  Administered 2019-09-15: 24 ug/min via INTRAVENOUS
  Administered 2019-09-15: 26 ug/min via INTRAVENOUS
  Administered 2019-09-15: 12 ug/min via INTRAVENOUS
  Administered 2019-09-15: 24 ug/min via INTRAVENOUS
  Administered 2019-09-16: 7 ug/min via INTRAVENOUS
  Administered 2019-09-16: 4 ug/min via INTRAVENOUS
  Administered 2019-09-17: 13 ug/min via INTRAVENOUS
  Administered 2019-09-17 – 2019-09-18 (×2): 8 ug/min via INTRAVENOUS
  Filled 2019-09-13 (×5): qty 250
  Filled 2019-09-13 (×2): qty 500
  Filled 2019-09-13: qty 750
  Filled 2019-09-13 (×8): qty 250

## 2019-09-13 MED ORDER — CEFAZOLIN SODIUM-DEXTROSE 2-3 GM-%(50ML) IV SOLR
INTRAVENOUS | Status: DC | PRN
  Administered 2019-09-13 (×2): 2 g via INTRAVENOUS

## 2019-09-13 MED ORDER — EPINEPHRINE 1 MG/10ML IJ SOSY
PREFILLED_SYRINGE | INTRAMUSCULAR | Status: DC | PRN
  Administered 2019-09-13 (×4): .1 mg via INTRAVENOUS

## 2019-09-13 MED ORDER — SODIUM CHLORIDE 0.9 % IV SOLN
INTRAVENOUS | Status: DC | PRN
  Administered 2019-09-13 (×2): via INTRAVENOUS

## 2019-09-13 MED ORDER — PROPOFOL 1000 MG/100ML IV EMUL
INTRAVENOUS | Status: AC | PRN
  Administered 2019-09-13: 2700 ug via INTRAVENOUS

## 2019-09-13 MED ORDER — CHLORHEXIDINE GLUCONATE CLOTH 2 % EX PADS
6.0000 | MEDICATED_PAD | Freq: Every day | CUTANEOUS | Status: DC
  Administered 2019-09-15 – 2019-09-27 (×12): 6 via TOPICAL

## 2019-09-13 MED ORDER — MIDAZOLAM 50MG/50ML (1MG/ML) PREMIX INFUSION
0.5000 mg/h | INTRAVENOUS | Status: DC
  Filled 2019-09-13: qty 50

## 2019-09-13 MED ORDER — ROCURONIUM BROMIDE 50 MG/5ML IV SOSY
PREFILLED_SYRINGE | INTRAVENOUS | Status: DC | PRN
  Administered 2019-09-13: 100 mg via INTRAVENOUS
  Administered 2019-09-13: 50 mg via INTRAVENOUS
  Administered 2019-09-13: 100 mg via INTRAVENOUS

## 2019-09-13 MED ORDER — ALBUMIN HUMAN 5 % IV SOLN
INTRAVENOUS | Status: DC | PRN
  Administered 2019-09-13 (×4): via INTRAVENOUS

## 2019-09-13 MED ORDER — IOHEXOL 300 MG/ML  SOLN
100.0000 mL | Freq: Once | INTRAMUSCULAR | Status: AC | PRN
  Administered 2019-09-13: 100 mL via INTRAVENOUS

## 2019-09-13 MED ORDER — DEXTROSE-NACL 5-0.9 % IV SOLN: INTRAVENOUS | Status: DC

## 2019-09-13 MED ORDER — ARTIFICIAL TEARS OPHTHALMIC OINT
TOPICAL_OINTMENT | OPHTHALMIC | Status: AC
  Filled 2019-09-13: qty 3.5

## 2019-09-13 MED ORDER — PIPERACILLIN-TAZOBACTAM 3.375 G IVPB
3.3750 g | Freq: Three times a day (TID) | INTRAVENOUS | Status: AC
  Administered 2019-09-14 (×4): 3.375 g via INTRAVENOUS
  Filled 2019-09-13 (×4): qty 50

## 2019-09-13 SURGICAL SUPPLY — 80 items
BIT DRILL QC 3.5X195 (BIT) ×5 IMPLANT
BLADE SURG 10 STRL SS (BLADE) IMPLANT
BNDG COHESIVE 4X5 TAN STRL (GAUZE/BANDAGES/DRESSINGS) IMPLANT
BNDG COHESIVE 6X5 TAN STRL LF (GAUZE/BANDAGES/DRESSINGS) ×5 IMPLANT
BNDG ELASTIC 4X5.8 VLCR STR LF (GAUZE/BANDAGES/DRESSINGS) ×5 IMPLANT
BNDG ELASTIC 6X10 VLCR STRL LF (GAUZE/BANDAGES/DRESSINGS) ×5 IMPLANT
BNDG ELASTIC 6X5.8 VLCR STR LF (GAUZE/BANDAGES/DRESSINGS) ×5 IMPLANT
BNDG GAUZE ELAST 4 BULKY (GAUZE/BANDAGES/DRESSINGS) ×10 IMPLANT
BRUSH SCRUB EZ PLAIN DRY (MISCELLANEOUS) ×10 IMPLANT
CHLORAPREP W/TINT 26 (MISCELLANEOUS) IMPLANT
CLAMP LG COMBINATION (Clamp) ×10 IMPLANT
CLAMP LG MULTI PIN (Clamp) ×20 IMPLANT
CLAMP ROD ATTACHMENT (Clamp) ×10 IMPLANT
CLIP VESOCCLUDE SM WIDE 24/CT (CLIP) ×5 IMPLANT
CLOSURE WOUND 1/2 X4 (GAUZE/BANDAGES/DRESSINGS)
COVER SURGICAL LIGHT HANDLE (MISCELLANEOUS) ×10 IMPLANT
COVER WAND RF STERILE (DRAPES) ×5 IMPLANT
DRAPE C-ARM 35X43 STRL (DRAPES) ×5 IMPLANT
DRAPE C-ARM 42X72 X-RAY (DRAPES) IMPLANT
DRAPE C-ARMOR (DRAPES) ×5 IMPLANT
DRAPE HALF SHEET 40X57 (DRAPES) ×5 IMPLANT
DRAPE IMP U-DRAPE 54X76 (DRAPES) ×20 IMPLANT
DRAPE INCISE IOBAN 66X45 STRL (DRAPES) ×5 IMPLANT
DRAPE ORTHO SPLIT 77X108 STRL (DRAPES) ×8
DRAPE SURG 17X23 STRL (DRAPES) IMPLANT
DRAPE SURG ORHT 6 SPLT 77X108 (DRAPES) ×12 IMPLANT
DRAPE U-SHAPE 47X51 STRL (DRAPES) ×5 IMPLANT
DRSG ADAPTIC 3X8 NADH LF (GAUZE/BANDAGES/DRESSINGS) ×10 IMPLANT
DRSG MEPILEX BORDER 4X4 (GAUZE/BANDAGES/DRESSINGS) IMPLANT
DRSG MEPILEX BORDER 4X8 (GAUZE/BANDAGES/DRESSINGS) IMPLANT
DRSG MEPITEL 4X7.2 (GAUZE/BANDAGES/DRESSINGS) IMPLANT
DRSG PAD ABDOMINAL 8X10 ST (GAUZE/BANDAGES/DRESSINGS) ×10 IMPLANT
ELECT REM PT RETURN 9FT ADLT (ELECTROSURGICAL) ×5
ELECTRODE REM PT RTRN 9FT ADLT (ELECTROSURGICAL) ×3 IMPLANT
GAUZE SPONGE 4X4 12PLY STRL (GAUZE/BANDAGES/DRESSINGS) ×10 IMPLANT
GLOVE BIO SURGEON STRL SZ 6.5 (GLOVE) ×24 IMPLANT
GLOVE BIO SURGEON STRL SZ7.5 (GLOVE) ×30 IMPLANT
GLOVE BIO SURGEONS STRL SZ 6.5 (GLOVE) ×6
GLOVE BIOGEL PI IND STRL 6.5 (GLOVE) ×6 IMPLANT
GLOVE BIOGEL PI IND STRL 7.5 (GLOVE) ×6 IMPLANT
GLOVE BIOGEL PI INDICATOR 6.5 (GLOVE) ×4
GLOVE BIOGEL PI INDICATOR 7.5 (GLOVE) ×4
GOWN STRL REUS W/ TWL LRG LVL3 (GOWN DISPOSABLE) ×21 IMPLANT
GOWN STRL REUS W/ TWL XL LVL3 (GOWN DISPOSABLE) ×3 IMPLANT
GOWN STRL REUS W/TWL LRG LVL3 (GOWN DISPOSABLE) ×14
GOWN STRL REUS W/TWL XL LVL3 (GOWN DISPOSABLE) ×2
KIT BASIN OR (CUSTOM PROCEDURE TRAY) ×10 IMPLANT
KIT TURNOVER KIT B (KITS) ×10 IMPLANT
MANIFOLD NEPTUNE II (INSTRUMENTS) ×10 IMPLANT
NEEDLE 22X1 1/2 (OR ONLY) (NEEDLE) IMPLANT
NS IRRIG 1000ML POUR BTL (IV SOLUTION) ×5 IMPLANT
PACK GENERAL/GYN (CUSTOM PROCEDURE TRAY) ×5 IMPLANT
PACK ORTHO EXTREMITY (CUSTOM PROCEDURE TRAY) IMPLANT
PAD ARMBOARD 7.5X6 YLW CONV (MISCELLANEOUS) ×10 IMPLANT
PADDING CAST COTTON 6X4 STRL (CAST SUPPLIES) IMPLANT
PIN SHANZ TRANSFIX 6.0X225 (PIN) ×10 IMPLANT
ROD CARBON FIBER 450MM (Rod) ×10 IMPLANT
ROD CRBN FBR LRG EX-FX 11X400 (Rod) ×10 IMPLANT
SCREW SHANZ 5.0X175MM (Screw) ×20 IMPLANT
SLEEVE SURGEON STRL (DRAPES) ×5 IMPLANT
SPONGE LAP 18X18 RF (DISPOSABLE) ×35 IMPLANT
STAPLER VISISTAT 35W (STAPLE) ×10 IMPLANT
STOCKINETTE IMPERVIOUS LG (DRAPES) ×5 IMPLANT
STRIP CLOSURE SKIN 1/2X4 (GAUZE/BANDAGES/DRESSINGS) IMPLANT
SUT ETHILON 2 0 FS 18 (SUTURE) ×35 IMPLANT
SUT ETHILON 2 0 PSLX (SUTURE) ×10 IMPLANT
SUT ETHILON 3 0 PS 1 (SUTURE) IMPLANT
SUT MNCRL AB 3-0 PS2 18 (SUTURE) IMPLANT
SUT PDS AB 0 CT1 27 (SUTURE) ×5 IMPLANT
SUT PROLENE 0 CT (SUTURE) IMPLANT
SUT PROLENE 5 0 C 1 24 (SUTURE) ×20 IMPLANT
SUT PROLENE 6 0 BV (SUTURE) ×5 IMPLANT
SUT VIC AB 0 CT1 27 (SUTURE)
SUT VIC AB 0 CT1 27XBRD ANBCTR (SUTURE) IMPLANT
SUT VIC AB 2-0 CT1 27 (SUTURE) ×2
SUT VIC AB 2-0 CT1 TAPERPNT 27 (SUTURE) ×3 IMPLANT
TOWEL GREEN STERILE (TOWEL DISPOSABLE) ×10 IMPLANT
TOWEL GREEN STERILE FF (TOWEL DISPOSABLE) ×20 IMPLANT
UNDERPAD 30X30 (UNDERPADS AND DIAPERS) ×20 IMPLANT
WATER STERILE IRR 1000ML POUR (IV SOLUTION) ×5 IMPLANT

## 2019-09-13 NOTE — Progress Notes (Addendum)
Critical ABG results given to MD. RR increased to 20 and FIO2 increased to 80%.

## 2019-09-13 NOTE — Progress Notes (Signed)
Transported patient back from OR on ventilator.  Pt in room 26 no events to report.

## 2019-09-13 NOTE — Transfer of Care (Signed)
Immediate Anesthesia Transfer of Care Note  Patient: Beck Cofer  Procedure(s) Performed: IRRIGATION AND DEBRIEDMENT AND EXTERNAL FIXATOR OF LEFT AND RIGHT ANKLES (Bilateral Ankle) Irrigation And Debridement Extremity (Left Leg Upper) LIGATION OF VENOUS BLEEDING IN LEFT THIGH. (Left Leg Upper)  Patient Location: ICU  Anesthesia Type:General  Level of Consciousness: sedated  Airway & Oxygen Therapy: Patient remains intubated per anesthesia plan and Patient placed on Ventilator (see vital sign flow sheet for setting)  Post-op Assessment: Report given to RN and Post -op Vital signs reviewed and stable  Post vital signs: Reviewed and stable  Last Vitals:  Vitals Value Taken Time  BP 131/82 2019/09/23 2336  Temp    Pulse 118 09-23-19 2338  Resp 18 2019/09/23 2338  SpO2 82 % 23-Sep-2019 2338  Vitals shown include unvalidated device data.  Last Pain:  Vitals:   09/23/19 1623  TempSrc: Temporal         Complications: No apparent anesthesia complications

## 2019-09-13 NOTE — Anesthesia Procedure Notes (Signed)
Performed by: Avelyn Touch M, CRNA       

## 2019-09-13 NOTE — ED Notes (Signed)
MD Darl Householder made aware by this RN of hypotension and inquired if he wanted more blood admin. MD declined at this time, stating pressure should improve if propofol is turned off. Will continue to monitor.
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
## 2019-09-13 NOTE — ED Notes (Signed)
Dr Darl Householder notified of hypotension and tachycardia, ordered to give 2 more units of RBCs rapidly transfused.

## 2019-09-13 NOTE — Procedures (Signed)
Central Venous Catheter Insertion Procedure Note [name] [id number] 1961-10-08  Procedure: Insertion of Central Venous Catheter Indications: Drug and/or fluid administration  Procedure Details Consent: Unable to obtain consent because of emergent medical necessity. Time Out: Verified patient identification, verified procedure, site/side was marked, verified correct patient position, special equipment/implants available, medications/allergies/relevent history reviewed, required imaging and test results available.  Performed  Maximum sterile technique was used including antiseptics, cap, gloves, gown, hand hygiene and mask. Skin prep: Chlorhexidine; local anesthetic administered A antimicrobial bonded/coated triple lumen catheter was placed in the right femoral vein due to emergent situation using the Seldinger technique.  Evaluation Blood flow good Complications: No apparent complications Patient did tolerate procedure well. Chest X-ray ordered to verify placement.  CXR: pending.  [name] 10/08/2019, 5:40 PM

## 2019-09-13 NOTE — ED Notes (Signed)
Dr. Darl Householder at bedside to stitch head wound
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
## 2019-09-13 NOTE — ED Notes (Signed)
Daughter- Laqueta Linden [phone]) would like an update when possible
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
## 2019-09-13 NOTE — Progress Notes (Signed)
Notified that the pt arrived to ICU. Pt arrived on 34mcg of Levophed. Pt had 2U PRBC given in ED Was not paged about this and no update given by ED after initial order for Levophed. D/w Dr. Doreatha Martin at bedside and discussed the need for washout of open femur contaminated with stool. Pt with CRNA at [facility] and need for vol resus.
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
## 2019-09-13 NOTE — Anesthesia Preprocedure Evaluation (Addendum)
Anesthesia Evaluation  Patient identified by MRN, date of birth, ID band  Reviewed: Unable to perform ROS - Chart review onlyPreop documentation limited or incomplete due to emergent nature of procedure.  Airway Mallampati: Intubated       Dental   Pulmonary    breath sounds clear to auscultation       Cardiovascular  Rhythm:regular Rate:Tachycardia     Neuro/Psych    GI/Hepatic   Endo/Other    Renal/GU      Musculoskeletal   Abdominal   Peds  Hematology   Anesthesia Other Findings   Reproductive/Obstetrics                            Anesthesia Physical Anesthesia Plan  ASA: IV and emergent  Anesthesia Plan: General   Post-op Pain Management:    Induction:   PONV Risk Score and Plan: 2 and Treatment may vary due to age or medical condition  Airway Management Planned: Oral ETT  Additional Equipment: Arterial line  Intra-op Plan:   Post-operative Plan: Post-operative intubation/ventilation  Informed Consent: I have reviewed the patients History and Physical, chart, labs and discussed the procedure including the risks, benefits and alternatives for the proposed anesthesia with the patient or authorized representative who has indicated his/her understanding and acceptance.     Only emergency history available and History available from chart only  Plan Discussed with: CRNA, Anesthesiologist and Surgeon  Anesthesia Plan Comments:        Anesthesia Quick Evaluation
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
## 2019-09-14 ENCOUNTER — Encounter (HOSPITAL_COMMUNITY): Payer: Self-pay | Admitting: Student

## 2019-09-14 ENCOUNTER — Inpatient Hospital Stay (HOSPITAL_COMMUNITY): Payer: 59

## 2019-09-14 ENCOUNTER — Inpatient Hospital Stay (HOSPITAL_COMMUNITY): Payer: 59 | Admitting: Anesthesiology

## 2019-09-14 ENCOUNTER — Encounter (HOSPITAL_COMMUNITY): Admission: EM | Disposition: E | Payer: Self-pay | Source: Home / Self Care

## 2019-09-14 DIAGNOSIS — S75092A Other specified injury of femoral artery, left leg, initial encounter: Secondary | ICD-10-CM

## 2019-09-14 HISTORY — PX: LAPAROTOMY: SHX154

## 2019-09-14 LAB — POCT I-STAT 7, (LYTES, BLD GAS, ICA,H+H)
Acid-base deficit: 10 mmol/L — ABNORMAL HIGH (ref 0.0–2.0)
Acid-base deficit: 10 mmol/L — ABNORMAL HIGH (ref 0.0–2.0)
Acid-base deficit: 8 mmol/L — ABNORMAL HIGH (ref 0.0–2.0)
Acid-base deficit: 6 mmol/L — ABNORMAL HIGH (ref 0.0–2.0)
Acid-base deficit: 7 mmol/L — ABNORMAL HIGH (ref 0.0–2.0)
Acid-base deficit: 6 mmol/L — ABNORMAL HIGH (ref 0.0–2.0)
Bicarbonate: 19.2 mmol/L — ABNORMAL LOW (ref 20.0–28.0)
Bicarbonate: 19 mmol/L — ABNORMAL LOW (ref 20.0–28.0)
Bicarbonate: 20.8 mmol/L (ref 20.0–28.0)
Bicarbonate: 21.3 mmol/L (ref 20.0–28.0)
Bicarbonate: 18.6 mmol/L — ABNORMAL LOW (ref 20.0–28.0)
Bicarbonate: 19.3 mmol/L — ABNORMAL LOW (ref 20.0–28.0)
Calcium, Ion: 1.2 mmol/L (ref 1.15–1.40)
Calcium, Ion: 1.2 mmol/L (ref 1.15–1.40)
Calcium, Ion: 1.18 mmol/L (ref 1.15–1.40)
Calcium, Ion: 1.18 mmol/L (ref 1.15–1.40)
Calcium, Ion: 1.15 mmol/L (ref 1.15–1.40)
Calcium, Ion: 1.13 mmol/L — ABNORMAL LOW (ref 1.15–1.40)
HCT: 30 % — ABNORMAL LOW (ref 39.0–52.0)
HCT: 30 % — ABNORMAL LOW (ref 39.0–52.0)
HCT: 22 % — ABNORMAL LOW (ref 39.0–52.0)
HCT: 22 % — ABNORMAL LOW (ref 39.0–52.0)
HCT: 27 % — ABNORMAL LOW (ref 39.0–52.0)
HCT: 26 % — ABNORMAL LOW (ref 39.0–52.0)
Hemoglobin: 10.2 g/dL — ABNORMAL LOW (ref 13.0–17.0)
Hemoglobin: 10.2 g/dL — ABNORMAL LOW (ref 13.0–17.0)
Hemoglobin: 7.5 g/dL — ABNORMAL LOW (ref 13.0–17.0)
Hemoglobin: 7.5 g/dL — ABNORMAL LOW (ref 13.0–17.0)
Hemoglobin: 9.2 g/dL — ABNORMAL LOW (ref 13.0–17.0)
Hemoglobin: 8.8 g/dL — ABNORMAL LOW (ref 13.0–17.0)
O2 Saturation: 83 %
O2 Saturation: 86 %
O2 Saturation: 75 %
O2 Saturation: 98 %
O2 Saturation: 100 %
O2 Saturation: 95 %
Patient temperature: 98
Patient temperature: 98
Patient temperature: 97.6
Patient temperature: 97.1
Patient temperature: 98
Potassium: 2.4 mmol/L — CL (ref 3.5–5.1)
Potassium: 2.4 mmol/L — CL (ref 3.5–5.1)
Potassium: 3 mmol/L — ABNORMAL LOW (ref 3.5–5.1)
Potassium: 3.2 mmol/L — ABNORMAL LOW (ref 3.5–5.1)
Potassium: 3.4 mmol/L — ABNORMAL LOW (ref 3.5–5.1)
Potassium: 4.1 mmol/L (ref 3.5–5.1)
Sodium: 152 mmol/L — ABNORMAL HIGH (ref 135–145)
Sodium: 152 mmol/L — ABNORMAL HIGH (ref 135–145)
Sodium: 150 mmol/L — ABNORMAL HIGH (ref 135–145)
Sodium: 150 mmol/L — ABNORMAL HIGH (ref 135–145)
Sodium: 149 mmol/L — ABNORMAL HIGH (ref 135–145)
Sodium: 145 mmol/L (ref 135–145)
TCO2: 21 mmol/L — ABNORMAL LOW (ref 22–32)
TCO2: 21 mmol/L — ABNORMAL LOW (ref 22–32)
TCO2: 23 mmol/L (ref 22–32)
TCO2: 23 mmol/L (ref 22–32)
TCO2: 20 mmol/L — ABNORMAL LOW (ref 22–32)
TCO2: 20 mmol/L — ABNORMAL LOW (ref 22–32)
pCO2 arterial: 56.6 mmHg — ABNORMAL HIGH (ref 32.0–48.0)
pCO2 arterial: 52.6 mmHg — ABNORMAL HIGH (ref 32.0–48.0)
pCO2 arterial: 56.5 mmHg — ABNORMAL HIGH (ref 32.0–48.0)
pCO2 arterial: 53.3 mmHg — ABNORMAL HIGH (ref 32.0–48.0)
pCO2 arterial: 35.5 mmHg (ref 32.0–48.0)
pCO2 arterial: 35 mmHg (ref 32.0–48.0)
pH, Arterial: 7.136 — CL (ref 7.350–7.450)
pH, Arterial: 7.164 — CL (ref 7.350–7.450)
pH, Arterial: 7.17 — CL (ref 7.350–7.450)
pH, Arterial: 7.208 — ABNORMAL LOW (ref 7.350–7.450)
pH, Arterial: 7.324 — ABNORMAL LOW (ref 7.350–7.450)
pH, Arterial: 7.348 — ABNORMAL LOW (ref 7.350–7.450)
pO2, Arterial: 61 mmHg — ABNORMAL LOW (ref 83.0–108.0)
pO2, Arterial: 65 mmHg — ABNORMAL LOW (ref 83.0–108.0)
pO2, Arterial: 49 mmHg — ABNORMAL LOW (ref 83.0–108.0)
pO2, Arterial: 138 mmHg — ABNORMAL HIGH (ref 83.0–108.0)
pO2, Arterial: 276 mmHg — ABNORMAL HIGH (ref 83.0–108.0)
pO2, Arterial: 79 mmHg — ABNORMAL LOW (ref 83.0–108.0)

## 2019-09-14 LAB — PREPARE FRESH FROZEN PLASMA
Unit division: 0
Unit division: 0
Unit division: 0
Unit division: 0
Unit division: 0
Unit division: 0
Unit division: 0
Unit division: 0
Unit division: 0
Unit division: 0
Unit division: 0
Unit division: 0
Unit division: 0
Unit division: 0
Unit division: 0
Unit division: 0
Unit division: 0
Unit division: 0
Unit division: 0

## 2019-09-14 LAB — BPAM FFP
Blood Product Expiration Date: 202011082359
Blood Product Expiration Date: 202011082359
Blood Product Expiration Date: 202011082359
Blood Product Expiration Date: 202011082359
Blood Product Expiration Date: 202011082359
Blood Product Expiration Date: 202011082359
Blood Product Expiration Date: 202011082359
Blood Product Expiration Date: 202011082359
Blood Product Expiration Date: 202011082359
Blood Product Expiration Date: 202011082359
Blood Product Expiration Date: 202011082359
Blood Product Expiration Date: 202011082359
Blood Product Expiration Date: 202011082359
Blood Product Expiration Date: 202011082359
Blood Product Expiration Date: 202011082359
Blood Product Expiration Date: 202011082359
Blood Product Expiration Date: 202011082359
Blood Product Expiration Date: 202011132359
Blood Product Expiration Date: 202011152359
Blood Product Expiration Date: 202011212359
Blood Product Expiration Date: 202011212359
ISSUE DATE / TIME: 202011031640
ISSUE DATE / TIME: 202011031640
ISSUE DATE / TIME: 202011031647
ISSUE DATE / TIME: 202011031647
ISSUE DATE / TIME: 202011032014
ISSUE DATE / TIME: 202011032014
ISSUE DATE / TIME: 202011032014
ISSUE DATE / TIME: 202011032014
ISSUE DATE / TIME: 202011032058
ISSUE DATE / TIME: 202011032058
ISSUE DATE / TIME: 202011032058
ISSUE DATE / TIME: 202011032105
ISSUE DATE / TIME: 202011032124
ISSUE DATE / TIME: 202011032124
ISSUE DATE / TIME: 202011032152
ISSUE DATE / TIME: 202011032157
ISSUE DATE / TIME: 202011041259
ISSUE DATE / TIME: 202011041259
Unit Type and Rh: 600
Unit Type and Rh: 600
Unit Type and Rh: 6200
Unit Type and Rh: 6200
Unit Type and Rh: 7300
Unit Type and Rh: 7300
Unit Type and Rh: 7300
Unit Type and Rh: 7300
Unit Type and Rh: 7300
Unit Type and Rh: 7300
Unit Type and Rh: 7300
Unit Type and Rh: 7300
Unit Type and Rh: 7300
Unit Type and Rh: 7300
Unit Type and Rh: 7300
Unit Type and Rh: 7300
Unit Type and Rh: 7300
Unit Type and Rh: 7300
Unit Type and Rh: 7300
Unit Type and Rh: 7300
Unit Type and Rh: 7300

## 2019-09-14 LAB — PREPARE CRYOPRECIPITATE
Unit division: 0
Unit division: 0
Unit division: 0

## 2019-09-14 LAB — BPAM PLATELET PHERESIS
Blood Product Expiration Date: 202011052359
Blood Product Expiration Date: 202011052359
Blood Product Expiration Date: 202011052359
Blood Product Expiration Date: 202011062359
ISSUE DATE / TIME: 202011032050
ISSUE DATE / TIME: 202011032121
ISSUE DATE / TIME: 202011032154
ISSUE DATE / TIME: 202011041258
Unit Type and Rh: 6200
Unit Type and Rh: 6200
Unit Type and Rh: 7300
Unit Type and Rh: 6200

## 2019-09-14 LAB — PREPARE PLATELET PHERESIS
Unit division: 0
Unit division: 0
Unit division: 0
Unit division: 0

## 2019-09-14 LAB — CBC WITH DIFFERENTIAL/PLATELET
Abs Immature Granulocytes: 0.06 10*3/uL (ref 0.00–0.07)
Basophils Absolute: 0 10*3/uL (ref 0.0–0.1)
Basophils Relative: 0 %
Eosinophils Absolute: 0 10*3/uL (ref 0.0–0.5)
Eosinophils Relative: 0 %
HCT: 32.5 % — ABNORMAL LOW (ref 39.0–52.0)
Hemoglobin: 11.2 g/dL — ABNORMAL LOW (ref 13.0–17.0)
Immature Granulocytes: 1 %
Lymphocytes Relative: 7 %
Lymphs Abs: 0.5 10*3/uL — ABNORMAL LOW (ref 0.7–4.0)
MCH: 29.4 pg (ref 26.0–34.0)
MCHC: 34.5 g/dL (ref 30.0–36.0)
MCV: 85.3 fL (ref 80.0–100.0)
Monocytes Absolute: 0.6 10*3/uL (ref 0.1–1.0)
Monocytes Relative: 9 %
Neutro Abs: 5.9 10*3/uL (ref 1.7–7.7)
Neutrophils Relative %: 83 %
Platelets: 57 10*3/uL — ABNORMAL LOW (ref 150–400)
RBC: 3.81 MIL/uL — ABNORMAL LOW (ref 4.22–5.81)
RDW: 14.1 % (ref 11.5–15.5)
WBC: 7.1 10*3/uL (ref 4.0–10.5)
nRBC: 0 % (ref 0.0–0.2)

## 2019-09-14 LAB — CBC
HCT: 32.5 % — ABNORMAL LOW (ref 39.0–52.0)
Hemoglobin: 10.8 g/dL — ABNORMAL LOW (ref 13.0–17.0)
MCH: 29.3 pg (ref 26.0–34.0)
MCHC: 33.2 g/dL (ref 30.0–36.0)
MCV: 88.3 fL (ref 80.0–100.0)
Platelets: 134 10*3/uL — ABNORMAL LOW (ref 150–400)
RBC: 3.68 MIL/uL — ABNORMAL LOW (ref 4.22–5.81)
RDW: 14.1 % (ref 11.5–15.5)
WBC: 5.8 10*3/uL (ref 4.0–10.5)
nRBC: 0.3 % — ABNORMAL HIGH (ref 0.0–0.2)

## 2019-09-14 LAB — BPAM CRYOPRECIPITATE
Blood Product Expiration Date: 202011040248
Blood Product Expiration Date: 202011040348
Blood Product Expiration Date: 202011040348
ISSUE DATE / TIME: 202011032106
ISSUE DATE / TIME: 202011032217
ISSUE DATE / TIME: 202011032217
Unit Type and Rh: 5100
Unit Type and Rh: 5100
Unit Type and Rh: 5100

## 2019-09-14 LAB — COMPREHENSIVE METABOLIC PANEL
ALT: 30 U/L (ref 0–44)
AST: 76 U/L — ABNORMAL HIGH (ref 15–41)
Albumin: 2.6 g/dL — ABNORMAL LOW (ref 3.5–5.0)
Alkaline Phosphatase: 58 U/L (ref 38–126)
Anion gap: 14 (ref 5–15)
BUN: 9 mg/dL (ref 6–20)
CO2: 20 mmol/L — ABNORMAL LOW (ref 22–32)
Calcium: 8.1 mg/dL — ABNORMAL LOW (ref 8.9–10.3)
Chloride: 118 mmol/L — ABNORMAL HIGH (ref 98–111)
Creatinine, Ser: 1.29 mg/dL — ABNORMAL HIGH (ref 0.61–1.24)
GFR calc Af Amer: >60 mL/min (ref >60)
GFR calc non Af Amer: >60 mL/min (ref >60)
Glucose, Bld: 187 mg/dL — ABNORMAL HIGH (ref 70–99)
Potassium: 2.4 mmol/L — CL (ref 3.5–5.1)
Sodium: 152 mmol/L — ABNORMAL HIGH (ref 135–145)
Total Bilirubin: 1.2 mg/dL (ref 0.3–1.2)
Total Protein: 4.7 g/dL — ABNORMAL LOW (ref 6.5–8.1)

## 2019-09-14 LAB — HEPARIN LEVEL (UNFRACTIONATED): Heparin Unfractionated: <0.1 [IU]/mL — ABNORMAL LOW (ref 0.30–0.70)

## 2019-09-14 LAB — PREPARE RBC (CROSSMATCH)

## 2019-09-14 LAB — VITAMIN D 25 HYDROXY (VIT D DEFICIENCY, FRACTURES): Vit D, 25-Hydroxy: 24.28 ng/mL — ABNORMAL LOW (ref 30–100)

## 2019-09-14 LAB — HIV ANTIBODY (ROUTINE TESTING W REFLEX): HIV Screen 4th Generation wRfx: NONREACTIVE

## 2019-09-14 SURGERY — LAPAROTOMY, EXPLORATORY
Anesthesia: General | Site: Abdomen

## 2019-09-14 MED ORDER — ROCURONIUM BROMIDE 10 MG/ML (PF) SYRINGE
PREFILLED_SYRINGE | INTRAVENOUS | Status: DC | PRN
  Administered 2019-09-14: 20 mg via INTRAVENOUS
  Administered 2019-09-14: 50 mg via INTRAVENOUS

## 2019-09-14 MED ORDER — VASOPRESSIN 20 UNIT/ML IV SOLN
0.0300 [IU]/min | INTRAVENOUS | Status: DC
  Administered 2019-09-14: 0.03 [IU]/min via INTRAVENOUS
  Administered 2019-09-15: 0.02 [IU]/min via INTRAVENOUS
  Filled 2019-09-14 (×2): qty 2

## 2019-09-14 MED ORDER — HEPARIN (PORCINE) 25000 UT/250ML-% IV SOLN
1250.0000 [IU]/h | INTRAVENOUS | Status: DC
  Administered 2019-09-14: 11:00:00 1250 [IU]/h via INTRAVENOUS
  Filled 2019-09-14: qty 250

## 2019-09-14 MED ORDER — PANTOPRAZOLE SODIUM 40 MG IV SOLR
40.0000 mg | INTRAVENOUS | Status: DC
  Administered 2019-09-14 – 2019-09-27 (×12): 40 mg via INTRAVENOUS
  Filled 2019-09-14 (×13): qty 40

## 2019-09-14 MED ORDER — PROPOFOL 500 MG/50ML IV EMUL
INTRAVENOUS | Status: DC | PRN
  Administered 2019-09-14: 50 ug/kg/min via INTRAVENOUS

## 2019-09-14 MED ORDER — CHLORHEXIDINE GLUCONATE 0.12% ORAL RINSE (MEDLINE KIT)
15.0000 mL | Freq: Two times a day (BID) | OROMUCOSAL | Status: DC
  Administered 2019-09-14 – 2019-09-27 (×28): 15 mL via OROMUCOSAL

## 2019-09-14 MED ORDER — FENTANYL CITRATE (PF) 250 MCG/5ML IJ SOLN
INTRAMUSCULAR | Status: DC | PRN
  Administered 2019-09-14: 50 ug via INTRAVENOUS

## 2019-09-14 MED ORDER — LEVETIRACETAM IN NACL 500 MG/100ML IV SOLN
500.0000 mg | Freq: Two times a day (BID) | INTRAVENOUS | Status: DC
  Administered 2019-09-14 – 2019-09-27 (×26): 500 mg via INTRAVENOUS
  Filled 2019-09-14 (×25): qty 100

## 2019-09-14 MED ORDER — CALCIUM CHLORIDE 10 % IV SOLN
INTRAVENOUS | Status: DC | PRN
  Administered 2019-09-14 (×4): 200 mg via INTRAVENOUS

## 2019-09-14 MED ORDER — ORAL CARE MOUTH RINSE
15.0000 mL | OROMUCOSAL | Status: DC
  Administered 2019-09-14 – 2019-09-25 (×105): 15 mL via OROMUCOSAL

## 2019-09-14 MED ORDER — 0.9 % SODIUM CHLORIDE (POUR BTL) OPTIME
TOPICAL | Status: DC | PRN
  Administered 2019-09-14 (×2): 2000 mL

## 2019-09-14 MED ORDER — POTASSIUM CHLORIDE 20 MEQ PO PACK
40.0000 meq | PACK | Freq: Two times a day (BID) | ORAL | Status: AC
  Administered 2019-09-14 (×2): 40 meq
  Filled 2019-09-14 (×2): qty 2

## 2019-09-14 MED ORDER — SODIUM CHLORIDE 0.9% IV SOLUTION: Freq: Once | INTRAVENOUS | Status: DC

## 2019-09-14 MED ORDER — FENTANYL CITRATE (PF) 250 MCG/5ML IJ SOLN
INTRAMUSCULAR | Status: AC
  Filled 2019-09-14: qty 5

## 2019-09-14 MED ORDER — LACTATED RINGERS IV SOLN
INTRAVENOUS | Status: DC
  Administered 2019-09-14 – 2019-09-18 (×10): via INTRAVENOUS

## 2019-09-14 MED ORDER — LACTATED RINGERS IV BOLUS
2000.0000 mL | Freq: Once | INTRAVENOUS | Status: AC
  Administered 2019-09-14: 2000 mL via INTRAVENOUS

## 2019-09-14 MED ORDER — POTASSIUM CHLORIDE 10 MEQ/100ML IV SOLN
10.0000 meq | INTRAVENOUS | Status: AC
  Administered 2019-09-14 (×4): 10 meq via INTRAVENOUS
  Filled 2019-09-14 (×4): qty 100

## 2019-09-14 MED ORDER — CALCIUM CHLORIDE 10 % IV SOLN
INTRAVENOUS | Status: AC
  Filled 2019-09-14: qty 10

## 2019-09-14 MED ORDER — HYDROCORTISONE NA SUCCINATE PF 100 MG IJ SOLR
100.0000 mg | Freq: Three times a day (TID) | INTRAMUSCULAR | Status: DC
  Administered 2019-09-14 – 2019-09-19 (×15): 100 mg via INTRAVENOUS
  Filled 2019-09-14 (×13): qty 2

## 2019-09-14 MED ORDER — HYDROCORTISONE NA SUCCINATE PF 250 MG IJ SOLR
INTRAMUSCULAR | Status: AC
  Filled 2019-09-14: qty 250

## 2019-09-14 MED ORDER — LACTATED RINGERS IV BOLUS
1000.0000 mL | Freq: Once | INTRAVENOUS | Status: AC
  Administered 2019-09-14: 1000 mL via INTRAVENOUS

## 2019-09-14 MED ORDER — EPINEPHRINE HCL 5 MG/250ML IV SOLN IN NS
0.5000 ug/min | INTRAVENOUS | Status: DC
  Administered 2019-09-14: 12 ug/min via INTRAVENOUS
  Filled 2019-09-14 (×3): qty 250

## 2019-09-14 MED ORDER — SODIUM CHLORIDE 0.9 % IV SOLN
INTRAVENOUS | Status: DC | PRN
  Administered 2019-09-14: 02:00:00 500 mL via INTRAVENOUS

## 2019-09-14 SURGICAL SUPPLY — 53 items
BLADE CLIPPER SURG (BLADE) IMPLANT
CANISTER SUCT 3000ML PPV (MISCELLANEOUS) ×3 IMPLANT
CANISTER WOUNDNEG PRESSURE 500 (CANNISTER) ×2 IMPLANT
CHLORAPREP W/TINT 26 (MISCELLANEOUS) ×3 IMPLANT
COVER SURGICAL LIGHT HANDLE (MISCELLANEOUS) ×3 IMPLANT
COVER WAND RF STERILE (DRAPES) ×3 IMPLANT
DRAPE LAPAROSCOPIC ABDOMINAL (DRAPES) ×3 IMPLANT
DRAPE WARM FLUID 44X44 (DRAPES) ×3 IMPLANT
DRSG OPSITE POSTOP 4X10 (GAUZE/BANDAGES/DRESSINGS) IMPLANT
DRSG OPSITE POSTOP 4X8 (GAUZE/BANDAGES/DRESSINGS) IMPLANT
DRSG VAC ATS LRG SENSATRAC (GAUZE/BANDAGES/DRESSINGS) ×2 IMPLANT
ELECT BLADE 6.5 EXT (BLADE) ×2 IMPLANT
ELECT CAUTERY BLADE 6.4 (BLADE) ×3 IMPLANT
ELECT REM PT RETURN 9FT ADLT (ELECTROSURGICAL) ×3
ELECTRODE REM PT RTRN 9FT ADLT (ELECTROSURGICAL) ×1 IMPLANT
GLOVE BIO SURGEON STRL SZ 6.5 (GLOVE) ×2 IMPLANT
GLOVE BIO SURGEON STRL SZ7.5 (GLOVE) ×2 IMPLANT
GLOVE BIO SURGEON STRL SZ8 (GLOVE) ×3 IMPLANT
GLOVE BIO SURGEONS STRL SZ 6.5 (GLOVE) ×2
GLOVE BIOGEL PI IND STRL 6 (GLOVE) IMPLANT
GLOVE BIOGEL PI IND STRL 8 (GLOVE) ×1 IMPLANT
GLOVE BIOGEL PI INDICATOR 6 (GLOVE) ×2
GLOVE BIOGEL PI INDICATOR 8 (GLOVE) ×2
GLOVE INDICATOR 7.0 STRL GRN (GLOVE) ×2 IMPLANT
GLOVE INDICATOR 7.5 STRL GRN (GLOVE) ×2 IMPLANT
GOWN STRL REUS W/ TWL LRG LVL3 (GOWN DISPOSABLE) ×1 IMPLANT
GOWN STRL REUS W/ TWL XL LVL3 (GOWN DISPOSABLE) ×1 IMPLANT
GOWN STRL REUS W/TWL LRG LVL3 (GOWN DISPOSABLE) ×2
GOWN STRL REUS W/TWL XL LVL3 (GOWN DISPOSABLE) ×2
HANDLE SUCTION POOLE (INSTRUMENTS) ×1 IMPLANT
KIT BASIN OR (CUSTOM PROCEDURE TRAY) ×3 IMPLANT
KIT TURNOVER KIT B (KITS) ×3 IMPLANT
LIGASURE IMPACT 36 18CM CVD LR (INSTRUMENTS) IMPLANT
NS IRRIG 1000ML POUR BTL (IV SOLUTION) ×6 IMPLANT
PACK GENERAL/GYN (CUSTOM PROCEDURE TRAY) ×3 IMPLANT
PAD ARMBOARD 7.5X6 YLW CONV (MISCELLANEOUS) ×3 IMPLANT
PENCIL SMOKE EVACUATOR (MISCELLANEOUS) ×3 IMPLANT
PIN CAPS ORTHO GREEN .062 (PIN) ×4 IMPLANT
SPECIMEN JAR LARGE (MISCELLANEOUS) IMPLANT
SPONGE ABDOMINAL VAC ABTHERA (MISCELLANEOUS) ×2 IMPLANT
SPONGE LAP 18X18 RF (DISPOSABLE) IMPLANT
SPONGE LAP 18X18 X RAY DECT (DISPOSABLE) ×4 IMPLANT
STAPLER VISISTAT 35W (STAPLE) ×3 IMPLANT
SUCTION POOLE HANDLE (INSTRUMENTS) ×3
SUT ETHILON O TP 1 (SUTURE) ×2 IMPLANT
SUT PDS AB 1 TP1 96 (SUTURE) ×6 IMPLANT
SUT SILK 2 0 SH CR/8 (SUTURE) ×7 IMPLANT
SUT SILK 2 0 TIES 10X30 (SUTURE) ×3 IMPLANT
SUT SILK 3 0 SH CR/8 (SUTURE) ×7 IMPLANT
SUT SILK 3 0 TIES 10X30 (SUTURE) ×3 IMPLANT
TOWEL GREEN STERILE (TOWEL DISPOSABLE) ×3 IMPLANT
TRAY FOLEY MTR SLVR 16FR STAT (SET/KITS/TRAYS/PACK) IMPLANT
YANKAUER SUCT BULB TIP NO VENT (SUCTIONS) ×2 IMPLANT

## 2019-09-14 NOTE — Progress Notes (Signed)
    Subjective  - POD #1  Went to the OR this afternoon for exploratory laparotomy   Physical Exam:  Remains intubated on pressors Doppler pedal  signals bilaterally Drainage from left thigh incision.  No evidence of compartment syndrome.       Assessment/Plan:  POD #1  No active vascular surgery issues. Please contact me if I can be of further assistance  [name] 09/29/2019 7:02 PM --  Vitals:   09/18/2019 1600 09/15/2019 1700  BP: 117/88 114/88  Pulse: (!) 112 (!) 110  Resp: (!) 30 (!) 30  Temp:    SpO2: 100% 100%    Intake/Output Summary (Last 24 hours) at 09/23/2019 1902 Last data filed at 09/21/2019 1700 Gross per 24 hour  Intake 24532.03 ml  Output 8750 ml  Net 15782.03 ml     Laboratory CBC    Component Value Date/Time   WBC 7.1 09/12/2019 1547   HGB 11.2 (L) 09/23/2019 1547   HCT 32.5 (L) 09/30/2019 1547   PLT 57 (L) 09/25/2019 1547    BMET    Component Value Date/Time   NA 149 (H) 09/18/2019 1529   K 3.4 (L) 09/20/2019 1529   CL 118 (H) 09/30/2019 0453   CO2 20 (L) 09/18/2019 0453   GLUCOSE 187 (H) 09/30/2019 0453   BUN 9 09/26/2019 0453   CREATININE 1.29 (H) 10/06/2019 0453   CALCIUM 8.1 (L) 09/29/2019 0453   GFRNONAA >60 09/21/2019 0453   GFRAA >60 10/04/2019 0453    COAG Lab Results  Component Value Date   INR 1.6 (H) 09/23/2019   INR 1.3 (H) 10/04/2019   No results found for: PTT  Antibiotics Anti-infectives (From admission, onward)   Start     Dose/Rate Route Frequency Ordered Stop   09/18/2019 0000  piperacillin-tazobactam (ZOSYN) IVPB 3.375 g  Status:  Discontinued     3.375 g 100 mL/hr over 30 Minutes Intravenous Every 6 hours 09/29/2019 2328 09/22/2019 2333   10/08/2019 2345  piperacillin-tazobactam (ZOSYN) IVPB 3.375 g     3.375 g 12.5 mL/hr over 240 Minutes Intravenous Every 8 hours 10/09/2019 2333 09/18/2019 1514   09/18/2019 1930  penicillin G potassium 2 Million Units in dextrose 5 % 50 mL IVPB  Status:  Discontinued     2 Million Units 100 mL/hr over 30 Minutes Intravenous On call to O.R. 10/02/2019 1924 10/10/2019 2328   09/23/2019 1915  ceFAZolin (ANCEF) IVPB 2g/100 mL premix  Status:  Discontinued     2 g 200 mL/hr over 30 Minutes Intravenous On call to O.R. 09/15/2019 1906 10/06/2019 2328   09/24/2019 1715  ceFAZolin (ANCEF) IVPB 2g/100 mL premix     2 g 200 mL/hr over 30 Minutes Intravenous  Once 10/01/2019 1701 10/04/2019 1726       V. Leia Alf, M.D., [facility] [place] Office: [phone] Pager:  [phone]

## 2019-09-14 NOTE — Anesthesia Preprocedure Evaluation (Signed)
Anesthesia Evaluation  Patient identified by MRN, date of birth, ID band Patient unresponsive    Reviewed: Unable to perform ROS - Chart review onlyPreop documentation limited or incomplete due to emergent nature of procedure.  Airway Mallampati: Intubated       Dental   Pulmonary           Cardiovascular      Neuro/Psych    GI/Hepatic   Endo/Other    Renal/GU      Musculoskeletal   Abdominal   Peds  Hematology   Anesthesia Other Findings   Reproductive/Obstetrics                             Anesthesia Physical Anesthesia Plan  ASA: IV and emergent  Anesthesia Plan: General   Post-op Pain Management:    Induction: Inhalational  PONV Risk Score and Plan: 3 and Ondansetron and Treatment may vary due to age or medical condition  Airway Management Planned: Oral ETT  Additional Equipment: Arterial line  Intra-op Plan:   Post-operative Plan: Post-operative intubation/ventilation  Informed Consent:   Plan Discussed with: CRNA  Anesthesia Plan Comments:         Anesthesia Quick Evaluation

## 2019-09-14 NOTE — Transfer of Care (Signed)
Immediate Anesthesia Transfer of Care Note  Patient: [name]  Procedure(s) Performed: EXPLORATORY LAPAROTOMY (N/A Abdomen)  Patient Location: ICU  Anesthesia Type:General  Level of Consciousness: sedated and unresponsive  Airway & Oxygen Therapy: Patient remains intubated per anesthesia plan and Patient placed on Ventilator (see vital sign flow sheet for setting)  Post-op Assessment: Report given to RN and Post -op Vital signs reviewed and stable  Post vital signs: Reviewed and stable  Last Vitals:  Vitals Value Taken Time  BP    Temp    Pulse 109 09/25/2019 1426  Resp 30 10/05/2019 1426  SpO2 100 % 09/17/2019 1426  Vitals shown include unvalidated device data.  Last Pain:  Vitals:   10/06/2019 1200  TempSrc: Axillary         Complications: No apparent anesthesia complications

## 2019-09-14 NOTE — Progress Notes (Signed)
Changes made to vent setting per MD after panic ABG results reported.  VT 600 Rate 22 Peep 5.

## 2019-09-14 NOTE — Progress Notes (Signed)
Trauma Critical Care Follow Up Note  Subjective:    Overnight Issues: Admitted yesterday PM. Stabilization of fractures with b/l ex-fix and b/l traction pins. Significant volume resuscitation with product and possible fat thrombus of left femoral vein.   Objective:  Vital signs for last 24 hours: Temp:  [96.3 F (35.7 C)-97.3 F (36.3 C)] 96.3 F (35.7 C) (11/04 0800) Pulse Rate:  [70-141] 112 (11/04 0800) Resp:  [9-23] 22 (11/04 0800) BP: (55-162)/(34-99) 121/70 (11/04 0800) SpO2:  [82 %-100 %] 96 % (11/04 0800) Arterial Line BP: (96-123)/(49-68) 123/51 (11/04 0800) FiO2 (%):  [60 %-100 %] 100 % (11/04 0751) Weight:  [90 kg] 90 kg (11/03 1750)  Hemodynamic parameters for last 24 hours:    Intake/Output from previous day: 11/03 0701 - 11/04 0700 In: 95284.1 [I.V.:5254.6; LKGMW:10272; IV Piggyback:1253.7] Out: 5366 [Urine:4265; Blood:3000]  Intake/Output this shift: Total I/O In: 1236.3 [I.V.:162.9; IV Piggyback:1073.4] Out: -   Vent settings for last 24 hours: Vent Mode: PRVC FiO2 (%):  [60 %-100 %] 100 % Set Rate:  [14 bmp-22 bmp] 22 bmp Vt Set:  [580 mL-600 mL] 600 mL PEEP:  [5 cmH20-10 cmH20] 10 cmH20 Plateau Pressure:  [12 cmH20-23 cmH20] 23 cmH20  Physical Exam:  Gen: no distress Neuro: sedated HEENT: intubated Neck: c-collar in place CV: tachycardic Pulm: unlabored breathing, mechanically ventilated Abd: soft, nontender GU: clear, yellow urine Extr: b/l LE with traction and exfix   Results for orders placed or performed during the hospital encounter of 09/25/2019 (from the past 24 hour(s))  CDS serology     Status: None   Collection Time: 10/09/2019  4:45 PM  Result Value Ref Range   CDS serology specimen      SPECIMEN WILL BE HELD FOR 14 DAYS IF TESTING IS REQUIRED  Comprehensive metabolic panel     Status: Abnormal   Collection Time: 09/12/2019  4:45 PM  Result Value Ref Range   Sodium 142 135 - 145 mmol/L   Potassium 3.8 3.5 - 5.1 mmol/L   Chloride 112 (H) 98 - 111 mmol/L   CO2 17 (L) 22 - 32 mmol/L   Glucose, Bld 160 (H) 70 - 99 mg/dL   BUN 9 6 - 20 mg/dL   Creatinine, Ser 4.40 (H) 0.61 - 1.24 mg/dL   Calcium 8.1 (L) 8.9 - 10.3 mg/dL   Total Protein 5.7 (L) 6.5 - 8.1 g/dL   Albumin 3.2 (L) 3.5 - 5.0 g/dL   AST 83 (H) 15 - 41 U/L   ALT 58 (H) 0 - 44 U/L   Alkaline Phosphatase 102 38 - 126 U/L   Total Bilirubin 0.2 (L) 0.3 - 1.2 mg/dL   GFR calc non Af Amer 57 (L) >60 mL/min   GFR calc Af Amer >60 >60 mL/min   Anion gap 13 5 - 15  CBC     Status: Abnormal   Collection Time: 10/03/2019  4:45 PM  Result Value Ref Range   WBC 6.3 4.0 - 10.5 K/uL   RBC 4.54 4.22 - 5.81 MIL/uL   Hemoglobin 12.8 (L) 13.0 - 17.0 g/dL   HCT 34.7 42.5 - 95.6 %   MCV 89.9 80.0 - 100.0 fL   MCH 28.2 26.0 - 34.0 pg   MCHC 31.4 30.0 - 36.0 g/dL   RDW 38.7 56.4 - 33.2 %   Platelets 254 150 - 400 K/uL   nRBC 0.0 0.0 - 0.2 %  Ethanol     Status: None   Collection Time: 10/03/2019  4:45 PM  Result Value Ref Range   Alcohol, Ethyl (B) <10 <10 mg/dL  Lactic acid, plasma     Status: Abnormal   Collection Time: 09/22/2019  4:45 PM  Result Value Ref Range   Lactic Acid, Venous 3.2 (HH) 0.5 - 1.9 mmol/L  Protime-INR     Status: Abnormal   Collection Time: 09/24/2019  4:45 PM  Result Value Ref Range   Prothrombin Time 16.2 (H) 11.4 - 15.2 seconds   INR 1.3 (H) 0.8 - 1.2  Type and screen Ordered by PROVIDER DEFAULT     Status: None (Preliminary result)   Collection Time: 09/15/2019  4:45 PM  Result Value Ref Range   ABO/RH(D) B POS    Antibody Screen NEG    Sample Expiration 10/03/2019,2359    Unit Number [id number]    Blood Component Type RED CELLS,LR    Unit division 00    Status of Unit ISSUED,FINAL    Unit tag comment EMERGENCY RELEASE    Transfusion Status OK TO TRANSFUSE    Crossmatch Result COMPATIBLE    Unit Number [id number]    Blood Component Type RED CELLS,LR    Unit division 00    Status of Unit ISSUED,FINAL    Unit tag comment  EMERGENCY RELEASE    Transfusion Status OK TO TRANSFUSE    Crossmatch Result COMPATIBLE    Unit Number [id number]    Blood Component Type RED CELLS,LR    Unit division 00    Status of Unit ISSUED,FINAL    Unit tag comment VERBAL ORDERS PER DR YAO    Transfusion Status OK TO TRANSFUSE    Crossmatch Result COMPATIBLE    Unit Number [id number]    Blood Component Type RED CELLS,LR    Unit division 00    Status of Unit ISSUED,FINAL    Unit tag comment VERBAL ORDERS PER DR    Transfusion Status OK TO TRANSFUSE    Crossmatch Result COMPATIBLE    Unit Number [id number]    Blood Component Type RED CELLS,LR    Unit division 00    Status of Unit ISSUED,FINAL    Unit tag comment VERBAL ORDERS PER DR    Transfusion Status OK TO TRANSFUSE    Crossmatch Result COMPATIBLE    Unit Number [id number]    Blood Component Type RED CELLS,LR    Unit division 00    Status of Unit ISSUED,FINAL    Transfusion Status OK TO TRANSFUSE    Crossmatch Result Compatible    Unit Number [id number]    Blood Component Type RED CELLS,LR    Unit division 00    Status of Unit ISSUED,FINAL    Transfusion Status OK TO TRANSFUSE    Crossmatch Result Compatible    Unit Number [id number]    Blood Component Type RED CELLS,LR    Unit division 00    Status of Unit ISSUED,FINAL    Transfusion Status OK TO TRANSFUSE    Crossmatch Result Compatible    Unit Number [id number]    Blood Component Type RED CELLS,LR    Unit division 00    Status of Unit ISSUED,FINAL    Transfusion Status OK TO TRANSFUSE    Crossmatch Result Compatible    Unit Number [id number]    Blood Component Type RED CELLS,LR    Unit division 00    Status of Unit ISSUED,FINAL    Transfusion Status OK TO TRANSFUSE    Crossmatch Result Compatible    Unit Number [id number]  Blood Component Type RED CELLS,LR    Unit division 00    Status of Unit ISSUED,FINAL    Transfusion Status OK TO TRANSFUSE    Crossmatch Result  Compatible    Unit Number [id number]    Blood Component Type RED CELLS,LR    Unit division 00    Status of Unit ISSUED,FINAL    Transfusion Status OK TO TRANSFUSE    Crossmatch Result Compatible    Unit Number [id number]    Blood Component Type RED CELLS,LR    Unit division 00    Status of Unit ISSUED,FINAL    Transfusion Status OK TO TRANSFUSE    Crossmatch Result Compatible    Unit Number [id number]    Blood Component Type RED CELLS,LR    Unit division 00    Status of Unit ISSUED,FINAL    Transfusion Status OK TO TRANSFUSE    Crossmatch Result Compatible    Unit Number [id number]    Blood Component Type RED CELLS,LR    Unit division 00    Status of Unit ISSUED,FINAL    Transfusion Status OK TO TRANSFUSE    Crossmatch Result Compatible    Unit Number [id number]    Blood Component Type RED CELLS,LR    Unit division 00    Status of Unit ISSUED,FINAL    Transfusion Status OK TO TRANSFUSE    Crossmatch Result Compatible    Unit Number [id number]    Blood Component Type RED CELLS,LR    Unit division 00    Status of Unit ISSUED,FINAL    Transfusion Status OK TO TRANSFUSE    Crossmatch Result Compatible    Unit Number [id number]    Blood Component Type RED CELLS,LR    Unit division 00    Status of Unit ISSUED,FINAL    Transfusion Status OK TO TRANSFUSE    Crossmatch Result Compatible    Unit Number [id number]    Blood Component Type RED CELLS,LR    Unit division 00    Status of Unit ISSUED,FINAL    Transfusion Status OK TO TRANSFUSE    Crossmatch Result Compatible    Unit Number [id number]    Blood Component Type RED CELLS,LR    Unit division 00    Status of Unit ISSUED,FINAL    Transfusion Status OK TO TRANSFUSE    Crossmatch Result Compatible    Unit Number [id number]    Blood Component Type RED CELLS,LR    Unit division 00    Status of Unit ISSUED,FINAL    Transfusion Status OK TO TRANSFUSE    Crossmatch Result Compatible    Unit  Number [id number]    Blood Component Type RED CELLS,LR    Unit division 00    Status of Unit ISSUED,FINAL    Transfusion Status OK TO TRANSFUSE    Crossmatch Result Compatible    Unit Number [id number]    Blood Component Type RED CELLS,LR    Unit division 00    Status of Unit ISSUED,FINAL    Transfusion Status OK TO TRANSFUSE    Crossmatch Result Compatible    Unit Number [id number]    Blood Component Type RED CELLS,LR    Unit division 00    Status of Unit ISSUED,FINAL    Transfusion Status OK TO TRANSFUSE    Crossmatch Result Compatible    Unit Number [id number]    Blood Component Type RED CELLS,LR    Unit division 00    Status of Unit ISSUED,FINAL    Transfusion Status OK  TO TRANSFUSE    Crossmatch Result Compatible    Unit Number [id number]    Blood Component Type RED CELLS,LR    Unit division 00    Status of Unit ALLOCATED    Transfusion Status OK TO TRANSFUSE    Crossmatch Result Compatible    Unit Number [id number]    Blood Component Type RED CELLS,LR    Unit division 00    Status of Unit ALLOCATED    Transfusion Status OK TO TRANSFUSE    Crossmatch Result Compatible    Unit Number [id number]    Blood Component Type RED CELLS,LR    Unit division 00    Status of Unit REL FROM [facility]    Transfusion Status OK TO TRANSFUSE    Crossmatch Result Compatible    Unit Number [id number]    Blood Component Type RBC LR PHER2    Unit division 00    Status of Unit REL FROM [facility]    Transfusion Status OK TO TRANSFUSE    Crossmatch Result Compatible    Unit Number [id number]    Blood Component Type RED CELLS,LR    Unit division 00    Status of Unit REL FROM [facility]    Transfusion Status OK TO TRANSFUSE    Crossmatch Result Compatible    Unit Number [id number]    Blood Component Type RED CELLS,LR    Unit division 00    Status of Unit REL FROM [facility]    Transfusion Status OK TO TRANSFUSE    Crossmatch Result Compatible    Unit Number  [id number]    Blood Component Type RED CELLS,LR    Unit division 00    Status of Unit ALLOCATED    Transfusion Status OK TO TRANSFUSE    Crossmatch Result Compatible    Unit Number [id number]    Blood Component Type RED CELLS,LR    Unit division 00    Status of Unit ALLOCATED    Transfusion Status OK TO TRANSFUSE    Crossmatch Result Compatible    Unit Number [id number]    Blood Component Type RED CELLS,LR    Unit division 00    Status of Unit REL FROM [facility]    Transfusion Status OK TO TRANSFUSE    Crossmatch Result Compatible    Unit Number [id number]    Blood Component Type RED CELLS,LR    Unit division 00    Status of Unit REL FROM [facility]    Transfusion Status OK TO TRANSFUSE    Crossmatch Result Compatible    Unit Number [id number]    Blood Component Type RED CELLS,LR    Unit division 00    Status of Unit REL FROM [facility]    Transfusion Status OK TO TRANSFUSE    Crossmatch Result Compatible    Unit Number [id number]    Blood Component Type RED CELLS,LR    Unit division 00    Status of Unit REL FROM [facility]    Transfusion Status OK TO TRANSFUSE    Crossmatch Result Compatible   ABO/Rh     Status: None   Collection Time: 10/02/2019  4:45 PM  Result Value Ref Range   ABO/RH(D)      B POS Performed at [facility], [address], [place], [place] [address]   Prepare fresh frozen plasma     Status: None (Preliminary result)   Collection Time: 10/06/2019  4:46 PM  Result Value Ref Range   Unit Number [id number]    Blood Component  Type LIQ PLASMA    Unit division 00    Status of Unit ISSUED,FINAL    Unit tag comment EMERGENCY RELEASE    Transfusion Status OK TO TRANSFUSE    Unit Number [id number]    Blood Component Type LIQ PLASMA    Unit division 00    Status of Unit ISSUED,FINAL    Unit tag comment EMERGENCY RELEASE    Transfusion Status OK TO TRANSFUSE    Unit Number [id number]    Blood Component Type LIQ PLASMA    Unit division 00     Status of Unit ISSUED,FINAL    Unit tag comment VERBAL ORDERS PER DR YAO    Transfusion Status OK TO TRANSFUSE    Unit Number [id number]    Blood Component Type LIQ PLASMA    Unit division 00    Status of Unit ISSUED,FINAL    Unit tag comment VERBAL ORDERS PER DR YAO    Transfusion Status OK TO TRANSFUSE    Unit Number [id number]    Blood Component Type THAWED PLASMA    Unit division 00    Status of Unit ISSUED,FINAL    Transfusion Status OK TO TRANSFUSE    Unit Number [id number]    Blood Component Type THAWED PLASMA    Unit division 00    Status of Unit ISSUED,FINAL    Transfusion Status OK TO TRANSFUSE    Unit Number [id number]    Blood Component Type THAWED PLASMA    Unit division 00    Status of Unit ISSUED,FINAL    Transfusion Status OK TO TRANSFUSE    Unit Number [id number]    Blood Component Type THAWED PLASMA    Unit division 00    Status of Unit ISSUED,FINAL    Transfusion Status OK TO TRANSFUSE    Unit Number [id number]    Blood Component Type THAWED PLASMA    Unit division 00    Status of Unit ISSUED,FINAL    Transfusion Status OK TO TRANSFUSE    Unit Number [id number]    Blood Component Type THAWED PLASMA    Unit division 00    Status of Unit ISSUED,FINAL    Transfusion Status OK TO TRANSFUSE    Unit Number [id number]    Blood Component Type THW PLS APHR    Unit division A0    Status of Unit ISSUED,FINAL    Transfusion Status OK TO TRANSFUSE    Unit Number [id number]    Blood Component Type THAWED PLASMA    Unit division 00    Status of Unit ISSUED,FINAL    Transfusion Status      OK TO TRANSFUSE Performed at [facility], [address] [place], [place] [address]    Unit Number [id number]    Blood Component Type THAWED PLASMA    Unit division 00    Status of Unit ISSUED,FINAL    Transfusion Status OK TO TRANSFUSE    Unit Number [id number]    Blood Component Type THAWED PLASMA    Unit division 00     Status of Unit ISSUED,FINAL    Transfusion Status OK TO TRANSFUSE    Unit Number [id number]    Blood Component Type THW PLS APHR    Unit division A0    Status of Unit ISSUED,FINAL    Transfusion Status OK TO TRANSFUSE    Unit Number [id number]    Blood Component Type THAWED PLASMA    Unit division 00    Status of Unit  ALLOCATED    Transfusion Status OK TO TRANSFUSE    Unit Number [id number]    Blood Component Type THAWED PLASMA    Unit division 00    Status of Unit ALLOCATED    Transfusion Status OK TO TRANSFUSE    Unit Number [id number]    Blood Component Type THAWED PLASMA    Unit division 00    Status of Unit ISSUED,FINAL    Transfusion Status OK TO TRANSFUSE    Unit Number [id number]    Blood Component Type THAWED PLASMA    Unit division 00    Status of Unit ALLOCATED    Transfusion Status OK TO TRANSFUSE    Unit Number [id number]    Blood Component Type THAWED PLASMA    Unit division 00    Status of Unit ALLOCATED    Transfusion Status OK TO TRANSFUSE    Unit Number [id number]    Blood Component Type THAWED PLASMA    Unit division 00    Status of Unit ALLOCATED    Transfusion Status OK TO TRANSFUSE   SARS Coronavirus 2 by RT PCR (hospital order, performed in [facility] hospital lab) Nasopharyngeal Nasopharyngeal Swab     Status: None   Collection Time: 09/24/2019  4:51 PM   Specimen: Nasopharyngeal Swab  Result Value Ref Range   SARS Coronavirus 2 NEGATIVE NEGATIVE  I-stat chem 8, ED     Status: Abnormal   Collection Time: 10/06/2019  4:58 PM  Result Value Ref Range   Sodium 145 135 - 145 mmol/L   Potassium 3.7 3.5 - 5.1 mmol/L   Chloride 111 98 - 111 mmol/L   BUN 9 6 - 20 mg/dL   Creatinine, Ser 2.59 0.61 - 1.24 mg/dL   Glucose, Bld 563 (H) 70 - 99 mg/dL   Calcium, Ion 8.75 (L) 1.15 - 1.40 mmol/L   TCO2 17 (L) 22 - 32 mmol/L   Hemoglobin 13.3 13.0 - 17.0 g/dL   HCT 64.3 32.9 - 51.8 %  Urinalysis, Routine w reflex microscopic     Status:  Abnormal   Collection Time: 09/30/2019  5:34 PM  Result Value Ref Range   Color, Urine STRAW (A) YELLOW   APPearance HAZY (A) CLEAR   Specific Gravity, Urine 1.012 1.005 - 1.030   pH 5.0 5.0 - 8.0   Glucose, UA 50 (A) NEGATIVE mg/dL   Hgb urine dipstick LARGE (A) NEGATIVE   Bilirubin Urine NEGATIVE NEGATIVE   Ketones, ur NEGATIVE NEGATIVE mg/dL   Protein, ur 30 (A) NEGATIVE mg/dL   Nitrite NEGATIVE NEGATIVE   Leukocytes,Ua NEGATIVE NEGATIVE   RBC / HPF 21-50 0 - 5 RBC/hpf   WBC, UA 0-5 0 - 5 WBC/hpf   Bacteria, UA NONE SEEN NONE SEEN   Squamous Epithelial / LPF 0-5 0 - 5   Mucus PRESENT   I-STAT 7, (LYTES, BLD GAS, ICA, H+H)     Status: Abnormal   Collection Time: 10/05/2019  6:21 PM  Result Value Ref Range   pH, Arterial 7.155 (LL) 7.350 - 7.450   pCO2 arterial 52.8 (H) 32.0 - 48.0 mmHg   pO2, Arterial 61.0 (L) 83.0 - 108.0 mmHg   Bicarbonate 19.0 (L) 20.0 - 28.0 mmol/L   TCO2 21 (L) 22 - 32 mmol/L   O2 Saturation 86.0 %   Acid-base deficit 10.0 (H) 0.0 - 2.0 mmol/L   Sodium 145 135 - 145 mmol/L   Potassium 3.2 (L) 3.5 - 5.1 mmol/L   Calcium, Ion 1.08 (  L) 1.15 - 1.40 mmol/L   HCT 27.0 (L) 39.0 - 52.0 %   Hemoglobin 9.2 (L) 13.0 - 17.0 g/dL   Patient temperature 16.1 F    Sample type ARTERIAL    Comment NOTIFIED PHYSICIAN   CBC     Status: Abnormal   Collection Time: 10/07/2019  7:20 PM  Result Value Ref Range   WBC 12.2 (H) 4.0 - 10.5 K/uL   RBC 3.57 (L) 4.22 - 5.81 MIL/uL   Hemoglobin 10.4 (L) 13.0 - 17.0 g/dL   HCT 09.6 (L) 04.5 - 40.9 %   MCV 93.6 80.0 - 100.0 fL   MCH 29.1 26.0 - 34.0 pg   MCHC 31.1 30.0 - 36.0 g/dL   RDW 81.1 91.4 - 78.2 %   Platelets 128 (L) 150 - 400 K/uL   nRBC 0.0 0.0 - 0.2 %  Basic metabolic panel     Status: Abnormal   Collection Time: 09/26/2019  7:20 PM  Result Value Ref Range   Sodium 141 135 - 145 mmol/L   Potassium 3.3 (L) 3.5 - 5.1 mmol/L   Chloride 114 (H) 98 - 111 mmol/L   CO2 18 (L) 22 - 32 mmol/L   Glucose, Bld 267 (H) 70 - 99  mg/dL   BUN 8 6 - 20 mg/dL   Creatinine, Ser 9.56 (H) 0.61 - 1.24 mg/dL   Calcium 6.7 (L) 8.9 - 10.3 mg/dL   GFR calc non Af Amer 54 (L) >60 mL/min   GFR calc Af Amer >60 >60 mL/min   Anion gap 9 5 - 15  MRSA PCR Screening     Status: None   Collection Time: 09/15/2019  7:33 PM   Specimen: Nasopharyngeal  Result Value Ref Range   MRSA by PCR NEGATIVE NEGATIVE  I-STAT 7, (LYTES, BLD GAS, ICA, H+H)     Status: Abnormal   Collection Time: 09/23/2019  7:55 PM  Result Value Ref Range   pH, Arterial 7.028 (LL) 7.350 - 7.450   pCO2 arterial 60.7 (H) 32.0 - 48.0 mmHg   pO2, Arterial 134.0 (H) 83.0 - 108.0 mmHg   Bicarbonate 16.4 (L) 20.0 - 28.0 mmol/L   TCO2 18 (L) 22 - 32 mmol/L   O2 Saturation 97.0 %   Acid-base deficit 14.0 (H) 0.0 - 2.0 mmol/L   Sodium 148 (H) 135 - 145 mmol/L   Potassium 3.0 (L) 3.5 - 5.1 mmol/L   Calcium, Ion 1.07 (L) 1.15 - 1.40 mmol/L   HCT 23.0 (L) 39.0 - 52.0 %   Hemoglobin 7.8 (L) 13.0 - 17.0 g/dL   Patient temperature 21.3 C    Sample type ARTERIAL    Comment NOTIFIED PHYSICIAN   Prepare platelet pheresis     Status: None   Collection Time: 09/18/2019  8:46 PM  Result Value Ref Range   Unit Number [id number]    Blood Component Type PLTP LI1 PAS    Unit division 00    Status of Unit ISSUED,FINAL    Transfusion Status      OK TO TRANSFUSE Performed at [facility], [address], [place], [place] [address]    Unit Number [id number]    Blood Component Type PLTP LR2 PAS    Unit division 00    Status of Unit ISSUED,FINAL    Transfusion Status OK TO TRANSFUSE    Unit Number [id number]    Blood Component Type PLTP LR1 PAS    Unit division 00    Status of Unit  ISSUED,FINAL    Transfusion Status OK TO TRANSFUSE   Prepare cryoprecipitate     Status: None   Collection Time: 09/15/2019  8:46 PM  Result Value Ref Range   Unit Number [id number]    Blood Component Type CRYPOOL THAW    Unit division 00    Status of Unit ISSUED,FINAL     Transfusion Status OK TO TRANSFUSE    Unit Number [id number]    Blood Component Type CRYPOOL THAW    Unit division 00    Status of Unit ISSUED,FINAL    Transfusion Status      OK TO TRANSFUSE Performed at [facility], [address] [place], [place] [address]    Unit Number [id number]    Blood Component Type CRYPOOL THAW    Unit division 00    Status of Unit ISSUED,FINAL    Transfusion Status OK TO TRANSFUSE   Fibrinogen (coagulopathy lab panel)     Status: None   Collection Time: 09/22/2019  9:32 PM  Result Value Ref Range   Fibrinogen 212 210 - 475 mg/dL  Platelet count (coagulopathy lab panel)     Status: Abnormal   Collection Time: 10/08/2019  9:32 PM  Result Value Ref Range   Platelets 47 (L) 150 - 400 K/uL  Hemoglobin and hematocrit, blood (coagulopathy lab panel)     Status: None   Collection Time: 09/12/2019  9:32 PM  Result Value Ref Range   Hemoglobin 13.1 13.0 - 17.0 g/dL   HCT 47.440.5 25.939.0 - 56.352.0 %  Protime-INR (coagulopathy lab panel)     Status: Abnormal   Collection Time: 10/03/2019  9:32 PM  Result Value Ref Range   Prothrombin Time 19.1 (H) 11.4 - 15.2 seconds   INR 1.6 (H) 0.8 - 1.2  APTT (coagulopathy lab panel)     Status: Abnormal   Collection Time: 09/17/2019  9:32 PM  Result Value Ref Range   aPTT 46 (H) 24 - 36 seconds  I-STAT 7, (LYTES, BLD GAS, ICA, H+H)     Status: Abnormal   Collection Time: 09/21/2019  9:32 PM  Result Value Ref Range   pH, Arterial 6.962 (LL) 7.350 - 7.450   pCO2 arterial 67.7 (HH) 32.0 - 48.0 mmHg   pO2, Arterial 58.0 (L) 83.0 - 108.0 mmHg   Bicarbonate 15.6 (L) 20.0 - 28.0 mmol/L   TCO2 18 (L) 22 - 32 mmol/L   O2 Saturation 74.0 %   Acid-base deficit 17.0 (H) 0.0 - 2.0 mmol/L   Sodium 147 (H) 135 - 145 mmol/L   Potassium 5.3 (H) 3.5 - 5.1 mmol/L   Calcium, Ion 0.49 (LL) 1.15 - 1.40 mmol/L   HCT 36.0 (L) 39.0 - 52.0 %   Hemoglobin 12.2 (L) 13.0 - 17.0 g/dL   Patient temperature 87.535.9 C    Sample type ARTERIAL    Comment  NOTIFIED PHYSICIAN   I-STAT 7, (LYTES, BLD GAS, ICA, H+H)     Status: Abnormal   Collection Time: 10/09/2019 10:52 PM  Result Value Ref Range   pH, Arterial 7.089 (LL) 7.350 - 7.450   pCO2 arterial 68.4 (HH) 32.0 - 48.0 mmHg   pO2, Arterial 42.0 (L) 83.0 - 108.0 mmHg   Bicarbonate 21.3 20.0 - 28.0 mmol/L   TCO2 24 22 - 32 mmol/L   O2 Saturation 66.0 %   Acid-base deficit 10.0 (H) 0.0 - 2.0 mmol/L   Sodium 150 (H) 135 - 145 mmol/L   Potassium 3.6 3.5 - 5.1 mmol/L  Calcium, Ion 0.96 (L) 1.15 - 1.40 mmol/L   HCT 32.0 (L) 39.0 - 52.0 %   Hemoglobin 10.9 (L) 13.0 - 17.0 g/dL   Patient temperature 24.4 C    Sample type ARTERIAL   I-STAT 7, (LYTES, BLD GAS, ICA, H+H)     Status: Abnormal   Collection Time: 10/06/2019 11:52 PM  Result Value Ref Range   pH, Arterial 7.087 (LL) 7.350 - 7.450   pCO2 arterial 68.6 (HH) 32.0 - 48.0 mmHg   pO2, Arterial 47.0 (L) 83.0 - 108.0 mmHg   Bicarbonate 20.8 20.0 - 28.0 mmol/L   TCO2 23 22 - 32 mmol/L   O2 Saturation 66.0 %   Acid-base deficit 10.0 (H) 0.0 - 2.0 mmol/L   Sodium 151 (H) 135 - 145 mmol/L   Potassium 2.9 (L) 3.5 - 5.1 mmol/L   Calcium, Ion 1.19 1.15 - 1.40 mmol/L   HCT 35.0 (L) 39.0 - 52.0 %   Hemoglobin 11.9 (L) 13.0 - 17.0 g/dL   Patient temperature 01.0 F    Collection site RADIAL, ALLEN'S TEST ACCEPTABLE    Drawn by RT    Sample type ARTERIAL    Comment NOTIFIED PHYSICIAN   I-STAT 7, (LYTES, BLD GAS, ICA, H+H)     Status: Abnormal   Collection Time: 16-Sep-2019  2:17 AM  Result Value Ref Range   pH, Arterial 7.136 (LL) 7.350 - 7.450   pCO2 arterial 56.6 (H) 32.0 - 48.0 mmHg   pO2, Arterial 61.0 (L) 83.0 - 108.0 mmHg   Bicarbonate 19.2 (L) 20.0 - 28.0 mmol/L   TCO2 21 (L) 22 - 32 mmol/L   O2 Saturation 83.0 %   Acid-base deficit 10.0 (H) 0.0 - 2.0 mmol/L   Sodium 152 (H) 135 - 145 mmol/L   Potassium 2.4 (LL) 3.5 - 5.1 mmol/L   Calcium, Ion 1.20 1.15 - 1.40 mmol/L   HCT 30.0 (L) 39.0 - 52.0 %   Hemoglobin 10.2 (L) 13.0 - 17.0  g/dL   Patient temperature 27.2 F    Collection site RADIAL, ALLEN'S TEST ACCEPTABLE    Drawn by RT    Sample type ARTERIAL    Comment NOTIFIED PHYSICIAN   I-STAT 7, (LYTES, BLD GAS, ICA, H+H)     Status: Abnormal   Collection Time: 09/15/2019  4:23 AM  Result Value Ref Range   pH, Arterial 7.164 (LL) 7.350 - 7.450   pCO2 arterial 52.6 (H) 32.0 - 48.0 mmHg   pO2, Arterial 65.0 (L) 83.0 - 108.0 mmHg   Bicarbonate 19.0 (L) 20.0 - 28.0 mmol/L   TCO2 21 (L) 22 - 32 mmol/L   O2 Saturation 86.0 %   Acid-base deficit 10.0 (H) 0.0 - 2.0 mmol/L   Sodium 152 (H) 135 - 145 mmol/L   Potassium 2.4 (LL) 3.5 - 5.1 mmol/L   Calcium, Ion 1.20 1.15 - 1.40 mmol/L   HCT 30.0 (L) 39.0 - 52.0 %   Hemoglobin 10.2 (L) 13.0 - 17.0 g/dL   Patient temperature 53.6 F    Collection site RADIAL, ALLEN'S TEST ACCEPTABLE    Drawn by RT    Sample type ARTERIAL   CBC     Status: Abnormal   Collection Time: 10/08/2019  4:53 AM  Result Value Ref Range   WBC 5.8 4.0 - 10.5 K/uL   RBC 3.68 (L) 4.22 - 5.81 MIL/uL   Hemoglobin 10.8 (L) 13.0 - 17.0 g/dL   HCT 64.4 (L)

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
## 2019-09-14 NOTE — Progress Notes (Signed)
Pt's daughter, Dearius Hoffmann, visited. She said he was recently discharged from a nursing rehab facility in [place]. He is currently living with his mother, who has dementia, and his two brothers. He was receiving in-home therapies and had a "nurse" several hours a day.  Pt was able to perform ADLs with minimal assistance. The house accomodates his wheelchair.
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
## 2019-09-14 NOTE — OR Nursing (Signed)
Start time for fourth procedure was 2226. Time out time was 2224.

## 2019-09-14 NOTE — Progress Notes (Signed)
Called for escalating pressors and hgb drop from 10 to 7. Patient seen at bedside and FAST exam performed showing significant fluid over the liver and the spleen, presumably blood. Initial CT A/P negative for intra-abdominal pathology. Plan for ex-lap. Consent obtained from daughter.   Jesusita Oka, MD General and [facility]

## 2019-09-14 NOTE — Progress Notes (Signed)
CRITICAL VALUE ALERT  Critical Value: Potassium 2.4   Date & Time Notied:  10/08/2019 @0633    Provider Notified:Ramirez   Orders Received/Actions taken: Notified MD

## 2019-09-14 NOTE — Progress Notes (Signed)
Orthopaedic Trauma Progress Note  S: Intubated and sedated. Acidotic and hypoxic. On pressors  O:  Vitals:   10/02/2019 0900 09/18/2019 0930  BP: (!) 109/56   Pulse: (!) 108 (!) 110  Resp: (!) 22 (!) 23  Temp:  97.6 F (36.4 C)  SpO2: 91% 91%    Gen: Untubated and sedated LLE: traction in placed, adjusted this AM slightly. Ex fix in place. Compartments soft and compressible. Thigh swollen. Dressings without significant drainage RLE: traction in place, adjusted. Ex-fix in place. Compartments soft and compressible  Imaging: reviewed imaging  Labs:  Results for orders placed or performed during the hospital encounter of 10/05/2019 (from the past 24 hour(s))  CDS serology     Status: None   Collection Time: 10/10/2019  4:45 PM  Result Value Ref Range   CDS serology specimen      SPECIMEN WILL BE HELD FOR 14 DAYS IF TESTING IS REQUIRED  Comprehensive metabolic panel     Status: Abnormal   Collection Time: 09/12/2019  4:45 PM  Result Value Ref Range   Sodium 142 135 - 145 mmol/L   Potassium 3.8 3.5 - 5.1 mmol/L   Chloride 112 (H) 98 - 111 mmol/L   CO2 17 (L) 22 - 32 mmol/L   Glucose, Bld 160 (H) 70 - 99 mg/dL   BUN 9 6 - 20 mg/dL   Creatinine, Ser 1.61 (H) 0.61 - 1.24 mg/dL   Calcium 8.1 (L) 8.9 - 10.3 mg/dL   Total Protein 5.7 (L) 6.5 - 8.1 g/dL   Albumin 3.2 (L) 3.5 - 5.0 g/dL   AST 83 (H) 15 - 41 U/L   ALT 58 (H) 0 - 44 U/L   Alkaline Phosphatase 102 38 - 126 U/L   Total Bilirubin 0.2 (L) 0.3 - 1.2 mg/dL   GFR calc non Af Amer 57 (L) >60 mL/min   GFR calc Af Amer >60 >60 mL/min   Anion gap 13 5 - 15  CBC     Status: Abnormal   Collection Time: 10/07/2019  4:45 PM  Result Value Ref Range   WBC 6.3 4.0 - 10.5 K/uL   RBC 4.54 4.22 - 5.81 MIL/uL   Hemoglobin 12.8 (L) 13.0 - 17.0 g/dL   HCT 09.6 04.5 - 40.9 %   MCV 89.9 80.0 - 100.0 fL   MCH 28.2 26.0 - 34.0 pg   MCHC 31.4 30.0 - 36.0 g/dL   RDW 81.1 91.4 - 78.2 %   Platelets 254 150 - 400 K/uL   nRBC 0.0 0.0 - 0.2 %  Ethanol      Status: None   Collection Time: 09/11/2019  4:45 PM  Result Value Ref Range   Alcohol, Ethyl (B) <10 <10 mg/dL  Lactic acid, plasma     Status: Abnormal   Collection Time: 10/06/2019  4:45 PM  Result Value Ref Range   Lactic Acid, Venous 3.2 (HH) 0.5 - 1.9 mmol/L  Protime-INR     Status: Abnormal   Collection Time: 10/04/2019  4:45 PM  Result Value Ref Range   Prothrombin Time 16.2 (H) 11.4 - 15.2 seconds   INR 1.3 (H) 0.8 - 1.2  Type and screen Ordered by PROVIDER DEFAULT     Status: None (Preliminary result)   Collection Time: 09/15/2019  4:45 PM  Result Value Ref Range   ABO/RH(D) B POS    Antibody Screen NEG    Sample Expiration 10/09/2019,2359    Unit Number [id number]    Blood Component  Type RED CELLS,LR    Unit division 00    Status of Unit ISSUED,FINAL    Unit tag comment EMERGENCY RELEASE    Transfusion Status OK TO TRANSFUSE    Crossmatch Result COMPATIBLE    Unit Number [id number]    Blood Component Type RED CELLS,LR    Unit division 00    Status of Unit ISSUED,FINAL    Unit tag comment EMERGENCY RELEASE    Transfusion Status OK TO TRANSFUSE    Crossmatch Result COMPATIBLE    Unit Number [id number]    Blood Component Type RED CELLS,LR    Unit division 00    Status of Unit ISSUED,FINAL    Unit tag comment VERBAL ORDERS PER DR YAO    Transfusion Status OK TO TRANSFUSE    Crossmatch Result COMPATIBLE    Unit Number [id number]    Blood Component Type RED CELLS,LR    Unit division 00    Status of Unit ISSUED,FINAL    Unit tag comment VERBAL ORDERS PER DR    Transfusion Status OK TO TRANSFUSE    Crossmatch Result COMPATIBLE    Unit Number [id number]    Blood Component Type RED CELLS,LR    Unit division 00    Status of Unit ISSUED,FINAL    Unit tag comment VERBAL ORDERS PER DR    Transfusion Status OK TO TRANSFUSE    Crossmatch Result COMPATIBLE    Unit Number [id number]    Blood Component Type RED CELLS,LR    Unit division 00    Status of  Unit ISSUED,FINAL    Transfusion Status OK TO TRANSFUSE    Crossmatch Result Compatible    Unit Number [id number]    Blood Component Type RED CELLS,LR    Unit division 00    Status of Unit ISSUED,FINAL    Transfusion Status OK TO TRANSFUSE    Crossmatch Result Compatible    Unit Number [id number]    Blood Component Type RED CELLS,LR    Unit division 00    Status of Unit ISSUED,FINAL    Transfusion Status OK TO TRANSFUSE    Crossmatch Result Compatible    Unit Number [id number]    Blood Component Type RED CELLS,LR    Unit division 00    Status of Unit ISSUED,FINAL    Transfusion Status OK TO TRANSFUSE    Crossmatch Result Compatible    Unit Number [id number]    Blood Component Type RED CELLS,LR    Unit division 00    Status of Unit ISSUED,FINAL    Transfusion Status OK TO TRANSFUSE    Crossmatch Result Compatible    Unit Number [id number]    Blood Component Type RED CELLS,LR    Unit division 00    Status of Unit ISSUED,FINAL    Transfusion Status OK TO TRANSFUSE    Crossmatch Result Compatible    Unit Number [id number]    Blood Component Type RED CELLS,LR    Unit division 00    Status of Unit ISSUED,FINAL    Transfusion Status OK TO TRANSFUSE    Crossmatch Result Compatible    Unit Number [id number]    Blood Component Type RED CELLS,LR    Unit division 00    Status of Unit ISSUED,FINAL    Transfusion Status OK TO TRANSFUSE    Crossmatch Result Compatible    Unit Number [id number]    Blood Component Type RED CELLS,LR    Unit division 00    Status of Unit ISSUED,FINAL  Transfusion Status OK TO TRANSFUSE    Crossmatch Result Compatible    Unit Number [id number]    Blood Component Type RED CELLS,LR    Unit division 00    Status of Unit ISSUED,FINAL    Transfusion Status OK TO TRANSFUSE    Crossmatch Result Compatible    Unit Number [id number]    Blood Component Type RED CELLS,LR    Unit division 00    Status of Unit ISSUED,FINAL     Transfusion Status OK TO TRANSFUSE    Crossmatch Result Compatible    Unit Number [id number]    Blood Component Type RED CELLS,LR    Unit division 00    Status of Unit ISSUED,FINAL    Transfusion Status OK TO TRANSFUSE    Crossmatch Result Compatible    Unit Number [id number]    Blood Component Type RED CELLS,LR    Unit division 00    Status of Unit ISSUED,FINAL    Transfusion Status OK TO TRANSFUSE    Crossmatch Result Compatible    Unit Number [id number]    Blood Component Type RED CELLS,LR    Unit division 00    Status of Unit ISSUED,FINAL    Transfusion Status OK TO TRANSFUSE    Crossmatch Result Compatible    Unit Number [id number]    Blood Component Type RED CELLS,LR    Unit division 00    Status of Unit ISSUED,FINAL    Transfusion Status OK TO TRANSFUSE    Crossmatch Result Compatible    Unit Number [id number]    Blood Component Type RED CELLS,LR    Unit division 00    Status of Unit ISSUED,FINAL    Transfusion Status OK TO TRANSFUSE    Crossmatch Result Compatible    Unit Number [id number]    Blood Component Type RED CELLS,LR    Unit division 00    Status of Unit ISSUED,FINAL    Transfusion Status OK TO TRANSFUSE    Crossmatch Result Compatible    Unit Number [id number]    Blood Component Type RED CELLS,LR    Unit division 00    Status of Unit ISSUED,FINAL    Transfusion Status OK TO TRANSFUSE    Crossmatch Result Compatible    Unit Number [id number]    Blood Component Type RED CELLS,LR    Unit division 00    Status of Unit ISSUED,FINAL    Transfusion Status OK TO TRANSFUSE    Crossmatch Result Compatible    Unit Number [id number]    Blood Component Type RED CELLS,LR    Unit division 00    Status of Unit ISSUED,FINAL    Transfusion Status OK TO TRANSFUSE    Crossmatch Result Compatible    Unit Number [id number]    Blood Component Type RED CELLS,LR    Unit division 00    Status of Unit ALLOCATED    Transfusion Status  OK TO TRANSFUSE    Crossmatch Result Compatible    Unit Number [id number]    Blood Component Type RED CELLS,LR    Unit division 00    Status of Unit ALLOCATED    Transfusion Status OK TO TRANSFUSE    Crossmatch Result Compatible    Unit Number [id number]    Blood Component Type RED CELLS,LR    Unit division 00    Status of Unit REL FROM [facility]    Transfusion Status OK TO TRANSFUSE    Crossmatch Result Compatible    Unit Number [id number]    Blood Component  Type RBC LR PHER2    Unit division 00    Status of Unit REL FROM [facility]    Transfusion Status OK TO TRANSFUSE    Crossmatch Result Compatible    Unit Number [id number]    Blood Component Type RED CELLS,LR    Unit division 00    Status of Unit REL FROM [facility]    Transfusion Status OK TO TRANSFUSE    Crossmatch Result Compatible    Unit Number [id number]    Blood Component Type RED CELLS,LR    Unit division 00    Status of Unit REL FROM [facility]    Transfusion Status OK TO TRANSFUSE    Crossmatch Result Compatible    Unit Number [id number]    Blood Component Type RED CELLS,LR    Unit division 00    Status of Unit ALLOCATED    Transfusion Status OK TO TRANSFUSE    Crossmatch Result Compatible    Unit Number [id number]    Blood Component Type RED CELLS,LR    Unit division 00    Status of Unit ALLOCATED    Transfusion Status OK TO TRANSFUSE    Crossmatch Result Compatible    Unit Number [id number]    Blood Component Type RED CELLS,LR    Unit division 00    Status of Unit REL FROM [facility]    Transfusion Status OK TO TRANSFUSE    Crossmatch Result Compatible    Unit Number [id number]    Blood Component Type RED CELLS,LR    Unit division 00    Status of Unit REL FROM [facility]    Transfusion Status OK TO TRANSFUSE    Crossmatch Result Compatible    Unit Number [id number]    Blood Component Type RED CELLS,LR    Unit division 00    Status of Unit REL FROM [facility]    Transfusion Status OK TO  TRANSFUSE    Crossmatch Result Compatible    Unit Number [id number]    Blood Component Type RED CELLS,LR    Unit division 00    Status of Unit REL FROM [facility]    Transfusion Status OK TO TRANSFUSE    Crossmatch Result Compatible    Unit Number [id number]    Blood Component Type RBC LR PHER2    Unit division 00    Status of Unit ISSUED    Transfusion Status OK TO TRANSFUSE    Crossmatch Result COMPATIBLE   ABO/Rh     Status: None   Collection Time: 10/09/2019  4:45 PM  Result Value Ref Range   ABO/RH(D)      B POS Performed at [facility], [address], [place], [place] [address]   Prepare fresh frozen plasma     Status: None (Preliminary result)   Collection Time: 09/26/2019  4:46 PM  Result Value Ref Range   Unit Number [id number]    Blood Component Type LIQ PLASMA    Unit division 00    Status of Unit ISSUED,FINAL    Unit tag comment EMERGENCY RELEASE    Transfusion Status OK TO TRANSFUSE    Unit Number [id number]    Blood Component Type LIQ PLASMA    Unit division 00    Status of Unit ISSUED,FINAL    Unit tag comment EMERGENCY RELEASE    Transfusion Status OK TO TRANSFUSE    Unit Number [id number]    Blood Component Type LIQ PLASMA    Unit division 00    Status of Unit ISSUED,FINAL  Unit tag comment VERBAL ORDERS PER DR YAO    Transfusion Status OK TO TRANSFUSE    Unit Number [id number]    Blood Component Type LIQ PLASMA    Unit division 00    Status of Unit ISSUED,FINAL    Unit tag comment VERBAL ORDERS PER DR YAO    Transfusion Status OK TO TRANSFUSE    Unit Number [id number]    Blood Component Type THAWED PLASMA    Unit division 00    Status of Unit ISSUED,FINAL    Transfusion Status OK TO TRANSFUSE    Unit Number [id number]    Blood Component Type THAWED PLASMA    Unit division 00    Status of Unit ISSUED,FINAL    Transfusion Status OK TO TRANSFUSE    Unit Number [id number]    Blood Component Type THAWED PLASMA     Unit division 00    Status of Unit ISSUED,FINAL    Transfusion Status OK TO TRANSFUSE    Unit Number [id number]    Blood Component Type THAWED PLASMA    Unit division 00    Status of Unit ISSUED,FINAL    Transfusion Status OK TO TRANSFUSE    Unit Number [id number]    Blood Component Type THAWED PLASMA    Unit division 00    Status of Unit ISSUED,FINAL    Transfusion Status OK TO TRANSFUSE    Unit Number [id number]    Blood Component Type THAWED PLASMA    Unit division 00    Status of Unit ISSUED,FINAL    Transfusion Status OK TO TRANSFUSE    Unit Number [id number]    Blood Component Type THW PLS APHR    Unit division A0    Status of Unit ISSUED,FINAL    Transfusion Status OK TO TRANSFUSE    Unit Number [id number]    Blood Component Type THAWED PLASMA    Unit division 00    Status of Unit ISSUED,FINAL    Transfusion Status      OK TO TRANSFUSE Performed at [facility], [address] [place], [place] [address]    Unit Number [id number]    Blood Component Type THAWED PLASMA    Unit division 00    Status of Unit ISSUED,FINAL    Transfusion Status OK TO TRANSFUSE    Unit Number [id number]    Blood Component Type THAWED PLASMA    Unit division 00    Status of Unit ISSUED,FINAL    Transfusion Status OK TO TRANSFUSE    Unit Number [id number]    Blood Component Type THW PLS APHR    Unit division A0    Status of Unit ISSUED,FINAL    Transfusion Status OK TO TRANSFUSE    Unit Number [id number]    Blood Component Type THAWED PLASMA    Unit division 00    Status of Unit ALLOCATED    Transfusion Status OK TO TRANSFUSE    Unit Number [id number]    Blood Component Type THAWED PLASMA    Unit division 00    Status of Unit ALLOCATED    Transfusion Status OK TO TRANSFUSE    Unit Number [id number]    Blood Component Type THAWED PLASMA    Unit division 00    Status of Unit ISSUED,FINAL    Transfusion Status OK TO TRANSFUSE    Unit  Number [id number]    Blood Component Type THAWED PLASMA    Unit division 00    Status of Unit  ALLOCATED    Transfusion Status OK TO TRANSFUSE    Unit Number [id number]    Blood Component Type THAWED PLASMA    Unit division 00    Status of Unit ALLOCATED    Transfusion Status OK TO TRANSFUSE    Unit Number [id number]    Blood Component Type THAWED PLASMA    Unit division 00    Status of Unit ALLOCATED    Transfusion Status OK TO TRANSFUSE   SARS Coronavirus 2 by RT PCR (hospital order, performed in [facility] hospital lab) Nasopharyngeal Nasopharyngeal Swab     Status: None   Collection Time: 09/23/2019  4:51 PM   Specimen: Nasopharyngeal Swab  Result Value Ref Range   SARS Coronavirus 2 NEGATIVE NEGATIVE  I-stat chem 8, ED     Status: Abnormal   Collection Time: 10/08/2019  4:58 PM  Result Value Ref Range   Sodium 145 135 - 145 mmol/L   Potassium 3.7 3.5 - 5.1 mmol/L   Chloride 111 98 - 111 mmol/L   BUN 9 6 - 20 mg/dL   Creatinine, Ser 3.24 0.61 - 1.24 mg/dL   Glucose, Bld 401 (H) 70 - 99 mg/dL   Calcium, Ion 0.27 (L) 1.15 - 1.40 mmol/L   TCO2 17 (L) 22 - 32 mmol/L   Hemoglobin 13.3 13.0 - 17.0 g/dL   HCT 25.3 66.4 - 40.3 %  Urinalysis, Routine w reflex microscopic     Status: Abnormal   Collection Time: 09/21/2019  5:34 PM  Result Value Ref Range   Color, Urine STRAW (A) YELLOW   APPearance HAZY (A) CLEAR   Specific Gravity, Urine 1.012 1.005 - 1.030   pH 5.0 5.0 - 8.0   Glucose, UA 50 (A) NEGATIVE mg/dL   Hgb urine dipstick LARGE (A) NEGATIVE   Bilirubin Urine NEGATIVE NEGATIVE   Ketones, ur NEGATIVE NEGATIVE mg/dL   Protein, ur 30 (A) NEGATIVE mg/dL   Nitrite NEGATIVE NEGATIVE   Leukocytes,Ua NEGATIVE NEGATIVE   RBC / HPF 21-50 0 - 5 RBC/hpf   WBC, UA 0-5 0 - 5 WBC/hpf   Bacteria, UA NONE SEEN NONE SEEN   Squamous Epithelial / LPF 0-5 0 - 5   Mucus PRESENT   I-STAT 7, (LYTES, BLD GAS, ICA, H+H)     Status: Abnormal   Collection Time: 09/22/2019  6:21 PM   Result Value Ref Range   pH, Arterial 7.155 (LL) 7.350 - 7.450   pCO2 arterial 52.8 (H) 32.0 - 48.0 mmHg   pO2, Arterial 61.0 (L) 83.0 - 108.0 mmHg   Bicarbonate 19.0 (L) 20.0 - 28.0 mmol/L   TCO2 21 (L) 22 - 32 mmol/L   O2 Saturation 86.0 %   Acid-base deficit 10.0 (H) 0.0 - 2.0 mmol/L   Sodium 145 135 - 145 mmol/L   Potassium 3.2 (L) 3.5 - 5.1 mmol/L   Calcium, Ion 1.08 (L) 1.15 - 1.40 mmol/L   HCT 27.0 (L) 39.0 - 52.0 %   Hemoglobin 9.2 (L) 13.0 - 17.0 g/dL   Patient temperature 47.4 F    Sample type ARTERIAL    Comment NOTIFIED PHYSICIAN   CBC     Status: Abnormal   Collection Time: 09/12/2019  7:20 PM  Result Value Ref Range   WBC 12.2 (H) 4.0 - 10.5 K/uL   RBC 3.57 (L) 4.22 - 5.81 MIL/uL   Hemoglobin 10.4 (L) 13.0 - 17.0 g/dL   HCT 25.9 (L) 56.3 - 87.5 %   MCV 93.6 80.0 -  100.0 fL   MCH 29.1 26.0 - 34.0 pg   MCHC 31.1 30.0 - 36.0 g/dL   RDW 16.114.1 09.611.5 - 04.515.5 %   Platelets 128 (L) 150 - 400 K/uL   nRBC 0.0 0.0 - 0.2 %  Basic metabolic panel     Status: Abnormal   Collection Time: 2018/11/20  7:20 PM  Result Value Ref Range   Sodium 141 135 - 145 mmol/L   Potassium 3.3 (L) 3.5 - 5.1 mmol/L   Chloride 114 (H) 98 - 111 mmol/L   CO2 18 (L) 22 - 32 mmol/L   Glucose, Bld 267 (H) 70 - 99 mg/dL   BUN 8 6 - 20 mg/dL   Creatinine, Ser 4.091.43 (H) 0.61 - 1.24 mg/dL   Calcium 6.7 (L) 8.9 - 10.3 mg/dL   GFR calc non Af Amer 54 (L) >60 mL/min   GFR calc Af Amer >60 >60 mL/min   Anion gap 9 5 - 15  MRSA PCR Screening     Status: None   Collection Time: 2018/11/20  7:33 PM   Specimen: Nasopharyngeal  Result Value Ref Range   MRSA by PCR NEGATIVE NEGATIVE  I-STAT 7, (LYTES, BLD GAS, ICA, H+H)     Status: Abnormal   Collection Time: 2018/11/20  7:55 PM  Result Value Ref Range   pH, Arterial 7.028 (LL) 7.350 - 7.450   pCO2 arterial 60.7 (H) 32.0 - 48.0 mmHg   pO2, Arterial 134.0 (H) 83.0 - 108.0 mmHg   Bicarbonate 16.4 (L) 20.0 - 28.0 mmol/L   TCO2 18 (L) 22 - 32 mmol/L   O2  Saturation 97.0 %   Acid-base deficit 14.0 (H) 0.0 - 2.0 mmol/L   Sodium 148 (H) 135 - 145 mmol/L   Potassium 3.0 (L) 3.5 - 5.1 mmol/L   Calcium, Ion 1.07 (L) 1.15 - 1.40 mmol/L   HCT 23.0 (L) 39.0 - 52.0 %   Hemoglobin 7.8 (L) 13.0 - 17.0 g/dL   Patient temperature 81.135.5 C    Sample type ARTERIAL    Comment NOTIFIED PHYSICIAN   Prepare platelet pheresis     Status: None   Collection Time: 2018/11/20  8:46 PM  Result Value Ref Range   Unit Number [id number]    Blood Component Type PLTP LI1 PAS    Unit division 00    Status of Unit ISSUED,FINAL    Transfusion Status      OK TO TRANSFUSE Performed at [facility], [address] [place], [place] [address]    Unit Number [id number]    Blood Component Type PLTP LR2 PAS    Unit division 00    Status of Unit ISSUED,FINAL    Transfusion Status OK TO TRANSFUSE    Unit Number [id number]    Blood Component Type PLTP LR1 PAS    Unit division 00    Status of Unit ISSUED,FINAL    Transfusion Status OK TO TRANSFUSE   Prepare cryoprecipitate     Status: None   Collection Time: 2018/11/20  8:46 PM  Result Value Ref Range   Unit Number [id number]    Blood Component Type CRYPOOL THAW    Unit division 00    Status of Unit ISSUED,FINAL    Transfusion Status OK TO TRANSFUSE    Unit Number [id number]    Blood Component Type CRYPOOL THAW    Unit division 00    Status of Unit ISSUED,FINAL    Transfusion Status      OK TO  TRANSFUSE Performed at [facility], [address] [place], [place] [address]    Unit Number [id number]    Blood Component Type CRYPOOL THAW    Unit division 00    Status of Unit ISSUED,FINAL    Transfusion Status OK TO TRANSFUSE   Fibrinogen (coagulopathy lab panel)     Status: None   Collection Time: 10/09/2019  9:32 PM  Result Value Ref Range   Fibrinogen 212 210 - 475 mg/dL  Platelet count (coagulopathy lab panel)     Status: Abnormal   Collection Time: 09/11/2019  9:32 PM  Result Value  Ref Range   Platelets 47 (L) 150 - 400 K/uL  Hemoglobin and hematocrit, blood (coagulopathy lab panel)     Status: None   Collection Time: 09/24/2019  9:32 PM  Result Value Ref Range   Hemoglobin 13.1 13.0 - 17.0 g/dL   HCT 78.2 95.6 - 21.3 %  Protime-INR (coagulopathy lab panel)     Status: Abnormal   Collection Time: 10/09/2019  9:32 PM  Result Value Ref Range   Prothrombin Time 19.1 (H) 11.4 - 15.2 seconds   INR 1.6 (H) 0.8 - 1.2  APTT (coagulopathy lab panel)     Status: Abnormal   Collection Time: 09/23/2019  9:32 PM  Result Value Ref Range   aPTT 46 (H) 24 - 36 seconds  I-STAT 7, (LYTES, BLD GAS, ICA, H+H)     Status: Abnormal   Collection Time: 09/15/2019  9:32 PM  Result Value Ref Range   pH, Arterial 6.962 (LL) 7.350 - 7.450   pCO2 arterial 67.7 (HH) 32.0 - 48.0 mmHg   pO2, Arterial 58.0 (L) 83.0 - 108.0 mmHg   Bicarbonate 15.6 (L) 20.0 - 28.0 mmol/L   TCO2 18 (L) 22 - 32 mmol/L   O2 Saturation 74.0 %   Acid-base deficit 17.0 (H) 0.0 - 2.0 mmol/L   Sodium 147 (H) 135 - 145 mmol/L   Potassium 5.3 (H) 3.5 - 5.1 mmol/L   Calcium, Ion 0.49 (LL) 1.15 - 1.40 mmol/L   HCT 36.0 (L) 39.0 - 52.0 %   Hemoglobin 12.2 (L) 13.0 - 17.0 g/dL   Patient temperature 08.6 C    Sample type ARTERIAL    Comment NOTIFIED PHYSICIAN   I-STAT 7, (LYTES, BLD GAS, ICA, H+H)     Status: Abnormal   Collection Time: 09/15/2019 10:52 PM  Result Value Ref Range   pH, Arterial 7.089 (LL) 7.350 - 7.450   pCO2 arterial 68.4 (HH) 32.0 - 48.0 mmHg   pO2, Arterial 42.0 (L) 83.0 - 108.0 mmHg   Bicarbonate 21.3 20.0 - 28.0 mmol/L   TCO2 24 22 - 32 mmol/L   O2 Saturation 66.0 %   Acid-base deficit 10.0 (H) 0.0 - 2.0 mmol/L   Sodium 150 (H) 135 - 145 mmol/L   Potassium 3.6 3.5 - 5.1 mmol/L   Calcium, Ion 0.96 (L) 1.15 - 1.40 mmol/L   HCT 32.0 (L) 39.0 - 52.0 %   Hemoglobin 10.9 (L) 13.0 - 17.0 g/dL   Patient temperature 57.8 C    Sample type ARTERIAL   I-STAT 7, (LYTES, BLD GAS, ICA, H+H)     Status: Abnormal    Collection Time: 09/30/2019 11:52 PM  Result Value Ref Range   pH, Arterial 7.087 (LL) 7.350 - 7.450   pCO2 arterial 68.6 (HH) 32.0 - 48.0 mmHg   pO2, Arterial 47.0 (L) 83.0 - 108.0 mmHg   Bicarbonate 20.8 20.0 - 28.0 mmol/L  TCO2 23 22 - 32 mmol/L   O2 Saturation 66.0 %   Acid-base deficit 10.0 (H) 0.0 - 2.0 mmol/L   Sodium 151 (H) 135 - 145 mmol/L   Potassium 2.9 (L) 3.5 - 5.1 mmol/L   Calcium, Ion 1.19 1.15 - 1.40 mmol/L   HCT 35.0 (L) 39.0 - 52.0 %   Hemoglobin 11.9 (L) 13.0 - 17.0 g/dL   Patient temperature 98.0 F    Collection site RADIAL, ALLEN'S TEST ACCEPTABLE    Drawn by RT    Sample type ARTERIAL    Comment NOTIFIED PHYSICIAN   I-STAT 7, (LYTES, BLD GAS, ICA, H+H)     Status: Abnormal   Collection Time: 09/11/2019  2:17 AM  Result Value Ref Range   pH, Arterial 7.136 (LL) 7.350 - 7.450   pCO2 arterial 56.6 (H) 32.0 - 48.0 mmHg   pO2, Arterial 61.0 (L) 83.0 - 108.0 mmHg   Bicarbonate 19.2 (L) 20.0 - 28.0 mmol/L   TCO2 21 (L) 22 - 32 mmol/L   O2 Saturation 83.0 %   Acid-base deficit 10.0 (H) 0.0 - 2.0 mmol/L   Sodium 152 (H) 135 - 145 mmol/L   Potassium 2.4 (LL) 3.5 - 5.1 mmol/L   Calcium, Ion 1.20 1.15 - 1.40 mmol/L   HCT 30.0 (L) 39.0 - 52.0 %   Hemoglobin 10.2 (L) 13.0 - 17.0 g/dL   Patient temperature 98.0 F    Collection site RADIAL, ALLEN'S TEST ACCEPTABLE    Drawn by RT    Sample type ARTERIAL    Comment NOTIFIED PHYSICIAN   I-STAT 7, (LYTES, BLD GAS, ICA, H+H)     Status: Abnormal   Collection Time: 10/04/2019  4:23 AM  Result Value Ref Range   pH, Arterial 7.164 (LL) 7.350 - 7.450   pCO2 arterial 52.6 (H) 32.0 - 48.0 mmHg   pO2, Arterial 65.0 (L) 83.0 - 108.0 mmHg   Bicarbonate 19.0 (L) 20.0 - 28.0 mmol/L   TCO2 21 (L) 22 - 32 mmol/L   O2 Saturation 86.0 %   Acid-base deficit 10.0 (H) 0.0 - 2.0 mmol/L   Sodium 152 (H) 135 - 145 mmol/L   Potassium 2.4 (LL) 3.5 - 5.1 mmol/L   Calcium, Ion 1.20 1.15 - 1.40 mmol/L   HCT 30.0 (L) 39.0 - 52.0 %    Hemoglobin 10.2 (L) 13.0 - 17.0 g/dL   Patient temperature 98.0 F    Collection site RADIAL, ALLEN'S TEST ACCEPTABLE    Drawn by RT    Sample type ARTERIAL   HIV Antibody (routine testing w rflx)     Status: None   Collection Time: 09/25/2019  4:53 AM  Result Value Ref Range   HIV Screen 4th Generation wRfx NON REACTIVE NON REACTIVE  CBC     Status: Abnormal   Collection Time: 10/06/2019  4:53 AM  Result Value Ref Range   WBC 5.8 4.0 - 10.5 K/uL   RBC 3.68 (L) 4.22 - 5.81 MIL/uL   Hemoglobin 10.8 (L) 13.0 - 17.0 g/dL   HCT 32.5 (L) 39.0 - 52.0 %   MCV 88.3 80.0 - 100.0 fL   MCH 29.3 26.0 - 34.0 pg   MCHC 33.2 30.0 - 36.0 g/dL   RDW 14.1 11.5 - 15.5 %   Platelets 134 (L) 150 - 400 K/uL   nRBC 0.3 (H) 0.0 - 0.2 %  Comprehensive metabolic panel     Status: Abnormal   Collection Time: 09/20/2019  4:53 AM  Result Value Ref Range  Sodium 152 (H) 135 - 145 mmol/L   Potassium 2.4 (LL) 3.5 - 5.1 mmol/L   Chloride 118 (H) 98 - 111 mmol/L   CO2 20 (L) 22 - 32 mmol/L   Glucose, Bld 187 (H) 70 - 99 mg/dL   BUN 9 6 - 20 mg/dL   Creatinine, Ser 1.611.29 (H) 0.61 - 1.24 mg/dL   Calcium 8.1 (L) 8.9 - 10.3 mg/dL   Total Protein 4.7 (L) 6.5 - 8.1 g/dL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

## 2019-09-14 NOTE — Progress Notes (Signed)
ABG results reported to Trauma MD.  Vent settings changed per order. Will repeat ABG.

## 2019-09-14 NOTE — Progress Notes (Signed)
Per anesthesia 20 U PRBC, 14 U FFP, 3 U PLT, 3 U Cryo in OR.
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
## 2019-09-14 NOTE — Progress Notes (Signed)
Orthopedic Tech Progress Note Patient Details:  [name] January 11, 1961 [id number]  Musculoskeletal Traction Type of Traction: [place] Skin Traction Traction Location: RL/LL Traction Weight: 15 lbs    Traction on right leg pulling on other surgery equipment. Ortho doc said it was ok.   [name] T 10/03/2019, 12:30 AM

## 2019-09-15 ENCOUNTER — Inpatient Hospital Stay (HOSPITAL_COMMUNITY): Payer: 59

## 2019-09-15 ENCOUNTER — Encounter (HOSPITAL_COMMUNITY): Payer: Self-pay | Admitting: Surgery

## 2019-09-15 LAB — COMPREHENSIVE METABOLIC PANEL
ALT: 37 U/L (ref 0–44)
AST: 94 U/L — ABNORMAL HIGH (ref 15–41)
Albumin: 2 g/dL — ABNORMAL LOW (ref 3.5–5.0)
Alkaline Phosphatase: 55 U/L (ref 38–126)
Anion gap: 10 (ref 5–15)
BUN: 15 mg/dL (ref 6–20)
CO2: 19 mmol/L — ABNORMAL LOW (ref 22–32)
Calcium: 7.3 mg/dL — ABNORMAL LOW (ref 8.9–10.3)
Chloride: 117 mmol/L — ABNORMAL HIGH (ref 98–111)
Creatinine, Ser: 1.24 mg/dL (ref 0.61–1.24)
GFR calc Af Amer: >60 mL/min (ref >60)
GFR calc non Af Amer: >60 mL/min (ref >60)
Glucose, Bld: 70 mg/dL (ref 70–99)
Potassium: 4 mmol/L (ref 3.5–5.1)
Sodium: 146 mmol/L — ABNORMAL HIGH (ref 135–145)
Total Bilirubin: 1.1 mg/dL (ref 0.3–1.2)
Total Protein: 3.9 g/dL — ABNORMAL LOW (ref 6.5–8.1)

## 2019-09-15 LAB — POCT I-STAT 7, (LYTES, BLD GAS, ICA,H+H)
Acid-base deficit: 7 mmol/L — ABNORMAL HIGH (ref 0.0–2.0)
Acid-base deficit: 4 mmol/L — ABNORMAL HIGH (ref 0.0–2.0)
Bicarbonate: 20.4 mmol/L (ref 20.0–28.0)
Bicarbonate: 19.7 mmol/L — ABNORMAL LOW (ref 20.0–28.0)
Calcium, Ion: 1.17 mmol/L (ref 1.15–1.40)
Calcium, Ion: 1.06 mmol/L — ABNORMAL LOW (ref 1.15–1.40)
HCT: 22 % — ABNORMAL LOW (ref 39.0–52.0)
HCT: 23 % — ABNORMAL LOW (ref 39.0–52.0)
Hemoglobin: 7.5 g/dL — ABNORMAL LOW (ref 13.0–17.0)
Hemoglobin: 7.8 g/dL — ABNORMAL LOW (ref 13.0–17.0)
O2 Saturation: 100 %
O2 Saturation: 100 %
Patient temperature: 36
Potassium: 3.2 mmol/L — ABNORMAL LOW (ref 3.5–5.1)
Potassium: 3.4 mmol/L — ABNORMAL LOW (ref 3.5–5.1)
Sodium: 152 mmol/L — ABNORMAL HIGH (ref 135–145)
Sodium: 148 mmol/L — ABNORMAL HIGH (ref 135–145)
TCO2: 22 mmol/L (ref 22–32)
TCO2: 21 mmol/L — ABNORMAL LOW (ref 22–32)
pCO2 arterial: 49.8 mmHg — ABNORMAL HIGH (ref 32.0–48.0)
pCO2 arterial: 30.5 mmHg — ABNORMAL LOW (ref 32.0–48.0)
pH, Arterial: 7.214 — ABNORMAL LOW (ref 7.350–7.450)
pH, Arterial: 7.419 (ref 7.350–7.450)
pO2, Arterial: 207 mmHg — ABNORMAL HIGH (ref 83.0–108.0)
pO2, Arterial: 163 mmHg — ABNORMAL HIGH (ref 83.0–108.0)

## 2019-09-15 LAB — CBC
HCT: 29.4 % — ABNORMAL LOW (ref 39.0–52.0)
HCT: 24.8 % — ABNORMAL LOW (ref 39.0–52.0)
Hemoglobin: 10.3 g/dL — ABNORMAL LOW (ref 13.0–17.0)
Hemoglobin: 8.8 g/dL — ABNORMAL LOW (ref 13.0–17.0)
MCH: 28.9 pg (ref 26.0–34.0)
MCH: 29.7 pg (ref 26.0–34.0)
MCHC: 35 g/dL (ref 30.0–36.0)
MCHC: 35.5 g/dL (ref 30.0–36.0)
MCV: 82.6 fL (ref 80.0–100.0)
MCV: 83.8 fL (ref 80.0–100.0)
Platelets: 45 10*3/uL — ABNORMAL LOW (ref 150–400)
Platelets: 38 10*3/uL — ABNORMAL LOW (ref 150–400)
RBC: 3.56 MIL/uL — ABNORMAL LOW (ref 4.22–5.81)
RBC: 2.96 MIL/uL — ABNORMAL LOW (ref 4.22–5.81)
RDW: 14.7 % (ref 11.5–15.5)
RDW: 15.2 % (ref 11.5–15.5)
WBC: 12.7 10*3/uL — ABNORMAL HIGH (ref 4.0–10.5)
WBC: 13.8 10*3/uL — ABNORMAL HIGH (ref 4.0–10.5)
nRBC: 0.2 % (ref 0.0–0.2)
nRBC: 0 % (ref 0.0–0.2)

## 2019-09-15 LAB — SURGICAL PCR SCREEN
MRSA, PCR: NEGATIVE
Staphylococcus aureus: NEGATIVE

## 2019-09-15 MED ORDER — ALBUMIN HUMAN 5 % IV SOLN
25.0000 g | Freq: Once | INTRAVENOUS | Status: AC
  Administered 2019-09-15: 25 g via INTRAVENOUS
  Filled 2019-09-15: qty 500

## 2019-09-15 MED ORDER — PIPERACILLIN-TAZOBACTAM 3.375 G IVPB
3.3750 g | Freq: Three times a day (TID) | INTRAVENOUS | Status: AC
  Administered 2019-09-15 – 2019-09-18 (×12): 3.375 g via INTRAVENOUS
  Filled 2019-09-15 (×10): qty 50

## 2019-09-15 MED ORDER — SODIUM CHLORIDE 0.9% IV SOLUTION
Freq: Once | INTRAVENOUS | Status: AC
  Administered 2019-09-15: 23:00:00 via INTRAVENOUS
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
## 2019-09-15 NOTE — Anesthesia Postprocedure Evaluation (Signed)
Anesthesia Post Note  Patient: [name]  Procedure(s) Performed: IRRIGATION AND DEBRIEDMENT AND EXTERNAL FIXATOR OF LEFT AND RIGHT ANKLES (Bilateral Ankle) Irrigation And Debridement Extremity (Left Leg Upper) LIGATION OF VENOUS BLEEDING IN LEFT THIGH. (Left Leg Upper)     Patient location during evaluation: SICU Anesthesia Type: General Level of consciousness: sedated Pain management: pain level controlled Vital Signs Assessment: vitals unstable Respiratory status: patient remains intubated per anesthesia plan Cardiovascular status: tachycardic Postop Assessment: no apparent nausea or vomiting Anesthetic complications: no Comments: Pt required massive resuscitation due to large blood loss.  Metabolic and respiratory acidosis. Coagulopathic. Pulmonary congestion with O2 sats low 80s.  To ICU intubated.  Trauma surgeon aware.    Last Vitals:  Vitals:   09/15/19 0700 09/15/19 0814  BP: 117/84 (!) 144/78  Pulse: 98 93  Resp: (!) 28 (!) 28  Temp: 37.1 C   SpO2: 100% 100%    Last Pain:  Vitals:   09/15/19 0700  TempSrc: Axillary                 [name] S

## 2019-09-15 NOTE — Anesthesia Postprocedure Evaluation (Signed)
Anesthesia Post Note  Patient: [name]  Procedure(s) Performed: EXPLORATORY LAPAROTOMY (N/A Abdomen)     Patient location during evaluation: SICU Anesthesia Type: General Level of consciousness: sedated Pain management: pain level controlled Vital Signs Assessment: post-procedure vital signs reviewed and stable Respiratory status: patient remains intubated per anesthesia plan Cardiovascular status: stable Postop Assessment: no apparent nausea or vomiting Anesthetic complications: no    Last Vitals:  Vitals:   09/15/19 0700 09/15/19 0814  BP: 117/84 (!) 144/78  Pulse: 98 93  Resp: (!) 28 (!) 28  Temp: 37.1 C   SpO2: 100% 100%    Last Pain:  Vitals:   09/15/19 0700  TempSrc: Axillary                 [name]

## 2019-09-16 ENCOUNTER — Inpatient Hospital Stay (HOSPITAL_COMMUNITY): Payer: 59

## 2019-09-16 ENCOUNTER — Encounter (HOSPITAL_COMMUNITY): Admission: EM | Disposition: E | Payer: Self-pay | Source: Home / Self Care

## 2019-09-16 ENCOUNTER — Encounter (HOSPITAL_COMMUNITY): Payer: Self-pay | Admitting: Certified Registered Nurse Anesthetist

## 2019-09-16 ENCOUNTER — Inpatient Hospital Stay (HOSPITAL_COMMUNITY): Payer: 59 | Admitting: Certified Registered Nurse Anesthetist

## 2019-09-16 HISTORY — PX: FEMUR IM NAIL: SHX1597

## 2019-09-16 HISTORY — PX: ORIF PELVIC FRACTURE WITH PERCUTANEOUS SCREWS: SHX6800

## 2019-09-16 HISTORY — PX: LAPAROTOMY: SHX154

## 2019-09-16 HISTORY — PX: I&D EXTREMITY: SHX5045

## 2019-09-16 LAB — CBC
HCT: 21.4 % — ABNORMAL LOW (ref 39.0–52.0)
HCT: 29.1 % — ABNORMAL LOW (ref 39.0–52.0)
Hemoglobin: 7.5 g/dL — ABNORMAL LOW (ref 13.0–17.0)
Hemoglobin: 10 g/dL — ABNORMAL LOW (ref 13.0–17.0)
MCH: 29.6 pg (ref 26.0–34.0)
MCH: 29.8 pg (ref 26.0–34.0)
MCHC: 35 g/dL (ref 30.0–36.0)
MCHC: 34.4 g/dL (ref 30.0–36.0)
MCV: 84.6 fL (ref 80.0–100.0)
MCV: 86.6 fL (ref 80.0–100.0)
Platelets: 50 10*3/uL — ABNORMAL LOW (ref 150–400)
Platelets: 61 10*3/uL — ABNORMAL LOW (ref 150–400)
RBC: 2.53 MIL/uL — ABNORMAL LOW (ref 4.22–5.81)
RBC: 3.36 MIL/uL — ABNORMAL LOW (ref 4.22–5.81)
RDW: 15.4 % (ref 11.5–15.5)
RDW: 15.5 % (ref 11.5–15.5)
WBC: 11.7 10*3/uL — ABNORMAL HIGH (ref 4.0–10.5)
WBC: 6.5 10*3/uL (ref 4.0–10.5)
nRBC: 0.2 % (ref 0.0–0.2)
nRBC: 0.8 % — ABNORMAL HIGH (ref 0.0–0.2)

## 2019-09-16 LAB — POCT I-STAT 7, (LYTES, BLD GAS, ICA,H+H)
Acid-base deficit: 4 mmol/L — ABNORMAL HIGH (ref 0.0–2.0)
Acid-base deficit: 6 mmol/L — ABNORMAL HIGH (ref 0.0–2.0)
Acid-base deficit: 6 mmol/L — ABNORMAL HIGH (ref 0.0–2.0)
Acid-base deficit: 6 mmol/L — ABNORMAL HIGH (ref 0.0–2.0)
Acid-base deficit: 5 mmol/L — ABNORMAL HIGH (ref 0.0–2.0)
Bicarbonate: 18.9 mmol/L — ABNORMAL LOW (ref 20.0–28.0)
Bicarbonate: 19 mmol/L — ABNORMAL LOW (ref 20.0–28.0)
Bicarbonate: 19 mmol/L — ABNORMAL LOW (ref 20.0–28.0)
Bicarbonate: 19.4 mmol/L — ABNORMAL LOW (ref 20.0–28.0)
Bicarbonate: 20.8 mmol/L (ref 20.0–28.0)
Calcium, Ion: 1.06 mmol/L — ABNORMAL LOW (ref 1.15–1.40)
Calcium, Ion: 1.02 mmol/L — ABNORMAL LOW (ref 1.15–1.40)
Calcium, Ion: 0.89 mmol/L — CL (ref 1.15–1.40)
Calcium, Ion: 1.1 mmol/L — ABNORMAL LOW (ref 1.15–1.40)
Calcium, Ion: 1 mmol/L — ABNORMAL LOW (ref 1.15–1.40)
HCT: 21 % — ABNORMAL LOW (ref 39.0–52.0)
HCT: 22 % — ABNORMAL LOW (ref 39.0–52.0)
HCT: 29 % — ABNORMAL LOW (ref 39.0–52.0)
HCT: 26 % — ABNORMAL LOW (ref 39.0–52.0)
HCT: 26 % — ABNORMAL LOW (ref 39.0–52.0)
Hemoglobin: 7.1 g/dL — ABNORMAL LOW (ref 13.0–17.0)
Hemoglobin: 7.5 g/dL — ABNORMAL LOW (ref 13.0–17.0)
Hemoglobin: 9.9 g/dL — ABNORMAL LOW (ref 13.0–17.0)
Hemoglobin: 8.8 g/dL — ABNORMAL LOW (ref 13.0–17.0)
Hemoglobin: 8.8 g/dL — ABNORMAL LOW (ref 13.0–17.0)
O2 Saturation: 99 %
O2 Saturation: 90 %
O2 Saturation: 92 %
O2 Saturation: 88 %
O2 Saturation: 96 %
Patient temperature: 36.3
Potassium: 2.9 mmol/L — ABNORMAL LOW (ref 3.5–5.1)
Potassium: 2.9 mmol/L — ABNORMAL LOW (ref 3.5–5.1)
Potassium: 3.5 mmol/L (ref 3.5–5.1)
Potassium: 3.1 mmol/L — ABNORMAL LOW (ref 3.5–5.1)
Potassium: 3.2 mmol/L — ABNORMAL LOW (ref 3.5–5.1)
Sodium: 149 mmol/L — ABNORMAL HIGH (ref 135–145)
Sodium: 150 mmol/L — ABNORMAL HIGH (ref 135–145)
Sodium: 154 mmol/L — ABNORMAL HIGH (ref 135–145)
Sodium: 151 mmol/L — ABNORMAL HIGH (ref 135–145)
Sodium: 153 mmol/L — ABNORMAL HIGH (ref 135–145)
TCO2: 20 mmol/L — ABNORMAL LOW (ref 22–32)
TCO2: 20 mmol/L — ABNORMAL LOW (ref 22–32)
TCO2: 20 mmol/L — ABNORMAL LOW (ref 22–32)
TCO2: 21 mmol/L — ABNORMAL LOW (ref 22–32)
TCO2: 22 mmol/L (ref 22–32)
pCO2 arterial: 26.4 mmHg — ABNORMAL LOW (ref 32.0–48.0)
pCO2 arterial: 32 mmHg (ref 32.0–48.0)
pCO2 arterial: 34.3 mmHg (ref 32.0–48.0)
pCO2 arterial: 36.7 mmHg (ref 32.0–48.0)
pCO2 arterial: 41 mmHg (ref 32.0–48.0)
pH, Arterial: 7.462 — ABNORMAL HIGH (ref 7.350–7.450)
pH, Arterial: 7.378 (ref 7.350–7.450)
pH, Arterial: 7.352 (ref 7.350–7.450)
pH, Arterial: 7.331 — ABNORMAL LOW (ref 7.350–7.450)
pH, Arterial: 7.312 — ABNORMAL LOW (ref 7.350–7.450)
pO2, Arterial: 113 mmHg — ABNORMAL HIGH (ref 83.0–108.0)
pO2, Arterial: 58 mmHg — ABNORMAL LOW (ref 83.0–108.0)
pO2, Arterial: 65 mmHg — ABNORMAL LOW (ref 83.0–108.0)
pO2, Arterial: 58 mmHg — ABNORMAL LOW (ref 83.0–108.0)
pO2, Arterial: 92 mmHg (ref 83.0–108.0)

## 2019-09-16 LAB — BPAM PLATELET PHERESIS
Blood Product Expiration Date: 202011072359
ISSUE DATE / TIME: 202011051735
Unit Type and Rh: 1700

## 2019-09-16 LAB — PREPARE PLATELET PHERESIS: Unit division: 0

## 2019-09-16 LAB — PREPARE RBC (CROSSMATCH)

## 2019-09-16 SURGERY — INSERTION, INTRAMEDULLARY ROD, FEMUR
Anesthesia: General

## 2019-09-16 MED ORDER — SODIUM CHLORIDE 0.9 % IV SOLN
INTRAVENOUS | Status: DC | PRN
  Administered 2019-09-16: 12:00:00 via INTRAVENOUS

## 2019-09-16 MED ORDER — ONDANSETRON HCL 4 MG/2ML IJ SOLN
INTRAMUSCULAR | Status: AC
  Filled 2019-09-16: qty 2

## 2019-09-16 MED ORDER — NOREPINEPHRINE 4 MG/250ML-% IV SOLN
0.0000 ug/min | INTRAVENOUS | Status: DC
  Filled 2019-09-16: qty 250

## 2019-09-16 MED ORDER — ROCURONIUM BROMIDE 10 MG/ML (PF) SYRINGE
PREFILLED_SYRINGE | INTRAVENOUS | Status: AC
  Filled 2019-09-16: qty 30

## 2019-09-16 MED ORDER — FENTANYL CITRATE (PF) 250 MCG/5ML IJ SOLN
INTRAMUSCULAR | Status: DC | PRN
  Administered 2019-09-16 (×5): 50 ug via INTRAVENOUS
  Administered 2019-09-16: 100 ug via INTRAVENOUS
  Administered 2019-09-16: 50 ug via INTRAVENOUS

## 2019-09-16 MED ORDER — POTASSIUM CHLORIDE 10 MEQ/50ML IV SOLN
10.0000 meq | INTRAVENOUS | Status: AC
  Administered 2019-09-16 (×4): 10 meq via INTRAVENOUS
  Filled 2019-09-16 (×4): qty 50

## 2019-09-16 MED ORDER — SODIUM CHLORIDE 0.9% IV SOLUTION
Freq: Once | INTRAVENOUS | Status: AC
  Administered 2019-09-16: 08:00:00 via INTRAVENOUS

## 2019-09-16 MED ORDER — STERILE WATER FOR IRRIGATION IR SOLN
Status: DC | PRN
  Administered 2019-09-16: 1000 mL

## 2019-09-16 MED ORDER — ALBUTEROL SULFATE HFA 108 (90 BASE) MCG/ACT IN AERS
INHALATION_SPRAY | RESPIRATORY_TRACT | Status: DC | PRN
  Administered 2019-09-16 (×2): 4 via RESPIRATORY_TRACT

## 2019-09-16 MED ORDER — BACITRACIN ZINC 500 UNIT/GM EX OINT
TOPICAL_OINTMENT | CUTANEOUS | Status: AC
  Filled 2019-09-16: qty 28.35

## 2019-09-16 MED ORDER — SODIUM CHLORIDE 0.9 % IV SOLN: 10.0000 mL/h | Freq: Once | INTRAVENOUS | Status: DC

## 2019-09-16 MED ORDER — FENTANYL CITRATE (PF) 250 MCG/5ML IJ SOLN
INTRAMUSCULAR | Status: AC
  Filled 2019-09-16: qty 5

## 2019-09-16 MED ORDER — PROPOFOL 10 MG/ML IV BOLUS
INTRAVENOUS | Status: AC
  Filled 2019-09-16: qty 20

## 2019-09-16 MED ORDER — CALCIUM CHLORIDE 10 % IV SOLN
INTRAVENOUS | Status: DC | PRN
  Administered 2019-09-16 (×3): 200 mg via INTRAVENOUS
  Administered 2019-09-16: 400 mg via INTRAVENOUS

## 2019-09-16 MED ORDER — 0.9 % SODIUM CHLORIDE (POUR BTL) OPTIME
TOPICAL | Status: DC | PRN
  Administered 2019-09-16 (×4): 1000 mL

## 2019-09-16 MED ORDER — ROCURONIUM BROMIDE 10 MG/ML (PF) SYRINGE
PREFILLED_SYRINGE | INTRAVENOUS | Status: AC
  Filled 2019-09-16: qty 10

## 2019-09-16 MED ORDER — ONDANSETRON HCL 4 MG/2ML IJ SOLN
INTRAMUSCULAR | Status: DC | PRN
  Administered 2019-09-16: 4 mg via INTRAVENOUS

## 2019-09-16 MED ORDER — MIDAZOLAM HCL 5 MG/5ML IJ SOLN
INTRAMUSCULAR | Status: DC | PRN
  Administered 2019-09-16 (×2): 2 mg via INTRAVENOUS

## 2019-09-16 MED ORDER — ALBUTEROL SULFATE HFA 108 (90 BASE) MCG/ACT IN AERS
INHALATION_SPRAY | RESPIRATORY_TRACT | Status: AC
  Filled 2019-09-16: qty 6.7

## 2019-09-16 MED ORDER — MIDAZOLAM HCL 2 MG/2ML IJ SOLN
INTRAMUSCULAR | Status: AC
  Filled 2019-09-16: qty 2

## 2019-09-16 MED ORDER — PHENYLEPHRINE HCL-NACL 10-0.9 MG/250ML-% IV SOLN
INTRAVENOUS | Status: DC | PRN
  Administered 2019-09-16: 50 ug/min via INTRAVENOUS

## 2019-09-16 MED ORDER — SODIUM CHLORIDE 0.9% FLUSH
10.0000 mL | Freq: Two times a day (BID) | INTRAVENOUS | Status: DC
  Administered 2019-09-16 – 2019-09-17 (×3): 10 mL
  Administered 2019-09-18: 20 mL
  Administered 2019-09-18: 10 mL

## 2019-09-16 MED ORDER — DEXAMETHASONE SODIUM PHOSPHATE 10 MG/ML IJ SOLN
INTRAMUSCULAR | Status: AC
  Filled 2019-09-16: qty 1

## 2019-09-16 MED ORDER — SODIUM CHLORIDE 0.9 % IR SOLN
Status: DC | PRN
  Administered 2019-09-16: 3000 mL

## 2019-09-16 MED ORDER — SODIUM CHLORIDE 0.9% FLUSH: 10.0000 mL | INTRAVENOUS | Status: DC | PRN

## 2019-09-16 MED ORDER — BACITRACIN ZINC 500 UNIT/GM EX OINT
TOPICAL_OINTMENT | CUTANEOUS | Status: DC | PRN
  Administered 2019-09-16: 1 via TOPICAL

## 2019-09-16 MED ORDER — TOBRAMYCIN SULFATE 1.2 G IJ SOLR
INTRAMUSCULAR | Status: AC
  Filled 2019-09-16: qty 1.2

## 2019-09-16 MED ORDER — ROCURONIUM BROMIDE 10 MG/ML (PF) SYRINGE
PREFILLED_SYRINGE | INTRAVENOUS | Status: DC | PRN
  Administered 2019-09-16: 30 mg via INTRAVENOUS
  Administered 2019-09-16: 50 mg via INTRAVENOUS
  Administered 2019-09-16: 80 mg via INTRAVENOUS
  Administered 2019-09-16: 20 mg via INTRAVENOUS
  Administered 2019-09-16: 50 mg via INTRAVENOUS

## 2019-09-16 MED ORDER — CALCIUM CHLORIDE 10 % IV SOLN
INTRAVENOUS | Status: AC
  Filled 2019-09-16: qty 10

## 2019-09-16 MED ORDER — PHENYLEPHRINE 40 MCG/ML (10ML) SYRINGE FOR IV PUSH (FOR BLOOD PRESSURE SUPPORT)
PREFILLED_SYRINGE | INTRAVENOUS | Status: AC
  Filled 2019-09-16: qty 10

## 2019-09-16 MED ORDER — POTASSIUM CHLORIDE 10 MEQ/50ML IV SOLN: 10.0000 meq | INTRAVENOUS | Status: AC

## 2019-09-16 MED ORDER — VANCOMYCIN HCL 1000 MG IV SOLR
INTRAVENOUS | Status: AC
  Filled 2019-09-16: qty 1000

## 2019-09-16 MED ORDER — LACTATED RINGERS IV SOLN
INTRAVENOUS | Status: DC | PRN
  Administered 2019-09-16 (×2): via INTRAVENOUS

## 2019-09-16 MED ORDER — VANCOMYCIN HCL 1000 MG IV SOLR
INTRAVENOUS | Status: DC | PRN
  Administered 2019-09-16: 1000 mg via TOPICAL

## 2019-09-16 MED ORDER — PHENYLEPHRINE 40 MCG/ML (10ML) SYRINGE FOR IV PUSH (FOR BLOOD PRESSURE SUPPORT)
PREFILLED_SYRINGE | INTRAVENOUS | Status: AC
  Filled 2019-09-16: qty 20

## 2019-09-16 MED ORDER — TOBRAMYCIN SULFATE 1.2 G IJ SOLR
INTRAMUSCULAR | Status: DC | PRN
  Administered 2019-09-16: 1.2 g via TOPICAL

## 2019-09-16 MED ORDER — PHENYLEPHRINE 40 MCG/ML (10ML) SYRINGE FOR IV PUSH (FOR BLOOD PRESSURE SUPPORT)
PREFILLED_SYRINGE | INTRAVENOUS | Status: DC | PRN
  Administered 2019-09-16 (×3): 80 ug via INTRAVENOUS

## 2019-09-16 MED ORDER — ALBUMIN HUMAN 5 % IV SOLN
INTRAVENOUS | Status: DC | PRN
  Administered 2019-09-16 (×3): via INTRAVENOUS

## 2019-09-16 SURGICAL SUPPLY — 117 items
BAG URINE DRAIN 2000ML AR STRL (UROLOGICAL SUPPLIES) ×5 IMPLANT
BIT DRILL CANN 4.5MM (BIT) ×6 IMPLANT
BIT DRILL CANN LRG QC 5X300 (BIT) ×5 IMPLANT
BIT DRILL SHORT 4.2 (BIT) ×6 IMPLANT
BLADE CLIPPER SURG (BLADE) IMPLANT
BLADE HELICAL TFNA 100 NSTRL (Anchor) ×5 IMPLANT
BLADE HELICAL TFNA 90 (Anchor) ×5 IMPLANT
BLADE SURG 10 STRL SS (BLADE) ×10 IMPLANT
BLADE SURG 11 STRL SS (BLADE) ×5 IMPLANT
BNDG COHESIVE 4X5 TAN STRL (GAUZE/BANDAGES/DRESSINGS) IMPLANT
BNDG COHESIVE 6X5 TAN STRL LF (GAUZE/BANDAGES/DRESSINGS) IMPLANT
BNDG ELASTIC 4X5.8 VLCR STR LF (GAUZE/BANDAGES/DRESSINGS) ×10 IMPLANT
BNDG ELASTIC 6X10 VLCR STRL LF (GAUZE/BANDAGES/DRESSINGS) ×10 IMPLANT
BNDG ELASTIC 6X15 VLCR STRL LF (GAUZE/BANDAGES/DRESSINGS) ×5 IMPLANT
BNDG ELASTIC 6X5.8 VLCR STR LF (GAUZE/BANDAGES/DRESSINGS) IMPLANT
BNDG GAUZE ELAST 4 BULKY (GAUZE/BANDAGES/DRESSINGS) ×10 IMPLANT
BRUSH SCRUB EZ PLAIN DRY (MISCELLANEOUS) ×10 IMPLANT
CATH GASTROSTOMY 20FR (CATHETERS) ×5 IMPLANT
CHLORAPREP W/TINT 26 (MISCELLANEOUS) ×5 IMPLANT
COVER MAYO STAND STRL (DRAPES) ×5 IMPLANT
COVER SURGICAL LIGHT HANDLE (MISCELLANEOUS) ×10 IMPLANT
COVER WAND RF STERILE (DRAPES) IMPLANT
DERMABOND ADVANCED (GAUZE/BANDAGES/DRESSINGS) ×2
DERMABOND ADVANCED .7 DNX12 (GAUZE/BANDAGES/DRESSINGS) ×3 IMPLANT
DRAPE C-ARM 35X43 STRL (DRAPES) IMPLANT
DRAPE C-ARM 42X72 X-RAY (DRAPES) ×10 IMPLANT
DRAPE C-ARMOR (DRAPES) ×10 IMPLANT
DRAPE HALF SHEET 40X57 (DRAPES) ×15 IMPLANT
DRAPE IMP U-DRAPE 54X76 (DRAPES) ×15 IMPLANT
DRAPE INCISE IOBAN 66X45 STRL (DRAPES) ×10 IMPLANT
DRAPE LAPAROSCOPIC ABDOMINAL (DRAPES) ×5 IMPLANT
DRAPE LAPAROTOMY 100X72X124 (DRAPES) ×10 IMPLANT
DRAPE ORTHO SPLIT 77X108 STRL (DRAPES) ×8
DRAPE SURG 17X23 STRL (DRAPES) ×30 IMPLANT
DRAPE SURG ORHT 6 SPLT 77X108 (DRAPES) ×12 IMPLANT
DRAPE U-SHAPE 47X51 STRL (DRAPES) ×15 IMPLANT
DRILL BIT CANN 4.5MM (BIT) ×4
DRILL BIT SHORT 4.2 (BIT) ×4
DRSG ADAPTIC 3X8 NADH LF (GAUZE/BANDAGES/DRESSINGS) ×10 IMPLANT
DRSG MEPILEX BORDER 4X4 (GAUZE/BANDAGES/DRESSINGS) ×10 IMPLANT
DRSG MEPILEX BORDER 4X8 (GAUZE/BANDAGES/DRESSINGS) ×20 IMPLANT
ELECT REM PT RETURN 9FT ADLT (ELECTROSURGICAL) ×5
ELECTRODE REM PT RTRN 9FT ADLT (ELECTROSURGICAL) ×3 IMPLANT
EVACUATOR 1/8 PVC DRAIN (DRAIN) IMPLANT
GAUZE SPONGE 4X4 12PLY STRL (GAUZE/BANDAGES/DRESSINGS) ×10 IMPLANT
GLOVE BIO SURGEON STRL SZ 6.5 (GLOVE) ×24 IMPLANT
GLOVE BIO SURGEON STRL SZ7.5 (GLOVE) ×45 IMPLANT
GLOVE BIO SURGEONS STRL SZ 6.5 (GLOVE) ×6
GLOVE BIOGEL PI IND STRL 6.5 (GLOVE) ×6 IMPLANT
GLOVE BIOGEL PI IND STRL 7.5 (GLOVE) ×6 IMPLANT
GLOVE BIOGEL PI INDICATOR 6.5 (GLOVE) ×4
GLOVE BIOGEL PI INDICATOR 7.5 (GLOVE) ×4
GLOVE ECLIPSE 7.5 STRL STRAW (GLOVE) ×5 IMPLANT
GOWN STRL REUS W/ TWL LRG LVL3 (GOWN DISPOSABLE) ×12 IMPLANT
GOWN STRL REUS W/ TWL XL LVL3 (GOWN DISPOSABLE) ×3 IMPLANT
GOWN STRL REUS W/TWL LRG LVL3 (GOWN DISPOSABLE) ×8
GOWN STRL REUS W/TWL XL LVL3 (GOWN DISPOSABLE) ×2
GUIDEWIRE 2.0MM (WIRE) ×15 IMPLANT
GUIDEWIRE 3.2X400 (WIRE) ×15 IMPLANT
GUIDEWIRE THREADED 2.8 (WIRE) ×5 IMPLANT
GUIDEWIRE THREADED 2.8MM (WIRE) ×10 IMPLANT
HANDPIECE INTERPULSE COAX TIP (DISPOSABLE) ×4
KIT BASIN OR (CUSTOM PROCEDURE TRAY) ×5 IMPLANT
KIT TURNOVER KIT B (KITS) ×5 IMPLANT
MANIFOLD NEPTUNE II (INSTRUMENTS) ×5 IMPLANT
NAIL TI CAN 10MM 130DEG 400 RT (Orthopedic Implant) ×5 IMPLANT
NAIL TI CANN TFNA 10MM-130DEG (Nail) ×5 IMPLANT
NS IRRIG 1000ML POUR BTL (IV SOLUTION) ×5 IMPLANT
PACK GENERAL/GYN (CUSTOM PROCEDURE TRAY) IMPLANT
PACK ORTHO EXTREMITY (CUSTOM PROCEDURE TRAY) ×5 IMPLANT
PACK TOTAL JOINT (CUSTOM PROCEDURE TRAY) ×5 IMPLANT
PACK UNIVERSAL I (CUSTOM PROCEDURE TRAY) ×5 IMPLANT
PAD ABD 8X10 STRL (GAUZE/BANDAGES/DRESSINGS) ×5 IMPLANT
PAD ARMBOARD 7.5X6 YLW CONV (MISCELLANEOUS) ×10 IMPLANT
PADDING CAST COTTON 6X4 STRL (CAST SUPPLIES) ×15 IMPLANT
PADDING CAST SYNTHETIC 4 (CAST SUPPLIES) ×6
PADDING CAST SYNTHETIC 4X4 STR (CAST SUPPLIES) ×9 IMPLANT
PREP POVIDONE IODINE SPRAY 2OZ (MISCELLANEOUS) ×5 IMPLANT
REAMER ROD DEEP FLUTE 2.5X950 (INSTRUMENTS) ×5 IMPLANT
SCREW CANN 32 THRD/95 7.3 (Screw) ×10 IMPLANT
SCREW CANN 6.5X120X32 (Screw) ×5 IMPLANT
SCREW LOCK STAR 5X46 (Screw) ×5 IMPLANT
SCREW LOCK STAR 5X48 (Screw) ×10 IMPLANT
SCREW LOCK STAR 5X52 (Screw) ×5 IMPLANT
SET HNDPC FAN SPRY TIP SCT (DISPOSABLE) ×6 IMPLANT
SPONGE LAP 18X18 RF (DISPOSABLE) ×10 IMPLANT
STAPLER VISISTAT 35W (STAPLE) ×10 IMPLANT
STOCKINETTE IMPERVIOUS LG (DRAPES) ×5 IMPLANT
SUCTION FRAZIER HANDLE 10FR (MISCELLANEOUS) ×4
SUCTION TUBE FRAZIER 10FR DISP (MISCELLANEOUS) ×6 IMPLANT
SUT ETHILON 2 0 FS 18 (SUTURE) ×5 IMPLANT
SUT ETHILON 3 0 PS 1 (SUTURE) ×25 IMPLANT
SUT MNCRL AB 3-0 PS2 18 (SUTURE) IMPLANT
SUT MON AB 2-0 CT1 27 (SUTURE) ×5 IMPLANT
SUT MON AB 2-0 CT1 36 (SUTURE) ×15 IMPLANT
SUT PDS AB 0 CT 36 (SUTURE) ×5 IMPLANT
SUT PDS AB 0 CTX 60 (SUTURE) ×5 IMPLANT
SUT SILK 2 0SH CR/8 30 (SUTURE) ×5 IMPLANT
SUT VIC AB 0 CT1 27 (SUTURE)
SUT VIC AB 0 CT1 27XBRD ANBCTR (SUTURE) IMPLANT
SUT VIC AB 2-0 CT1 (SUTURE) ×10 IMPLANT
SUT VIC AB 2-0 CT1 27 (SUTURE) ×4
SUT VIC AB 2-0 CT1 TAPERPNT 27 (SUTURE) ×6 IMPLANT
SWAB CULTURE ESWAB REG 1ML (MISCELLANEOUS) IMPLANT
SYR 50ML LL SCALE MARK (SYRINGE) ×10 IMPLANT
TAPE CLOTH SURG 4X10 WHT LF (GAUZE/BANDAGES/DRESSINGS) ×5 IMPLANT
TOWEL GREEN STERILE (TOWEL DISPOSABLE) ×10 IMPLANT
TOWEL GREEN STERILE FF (TOWEL DISPOSABLE) ×10 IMPLANT
TRAY FOLEY MTR SLVR 16FR STAT (SET/KITS/TRAYS/PACK) IMPLANT
TUBE CONNECTING 12'X1/4 (SUCTIONS) ×1
TUBE CONNECTING 12X1/4 (SUCTIONS) ×4 IMPLANT
TUBE CONNECTING 20'X1/4 (TUBING) ×1
TUBE CONNECTING 20X1/4 (TUBING) ×4 IMPLANT
UNDERPAD 30X30 (UNDERPADS AND DIAPERS) ×5 IMPLANT
WASHER FOR 5.0 SCREWS (Washer) ×10 IMPLANT
WATER STERILE IRR 1000ML POUR (IV SOLUTION) IMPLANT
YANKAUER SUCT BULB TIP NO VENT (SUCTIONS) ×15 IMPLANT

## 2019-09-16 NOTE — Progress Notes (Signed)
Ortho Trauma Note  Patient stabilizing. Pulmonary and hemodynamics improving. Will perform repeat I&D of left femur and surgical fixation of left femur possible right femur and possible pelvis depending on how stable the patient is. Discussed with daughter and she agrees to proceed with surgery.  Shona Needles, MD [facility] [phone] (office) [email or web address]
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
## 2019-09-16 NOTE — Transfer of Care (Signed)
Immediate Anesthesia Transfer of Care Note  Patient: Mickel Crow  Procedure(s) Performed: INTRAMEDULLARY (IM) NAIL FEMORAL (Bilateral ) IRRIGATION AND DEBRIDEMENT EXTREMITY (Bilateral ) ORIF PELVIC FRACTURE WITH PERCUTANEOUS SCREWS (Bilateral ) Exploratory Laparotomy  Patient Location: ICU  Anesthesia Type:General  Level of Consciousness: Patient remains intubated per anesthesia plan  Airway & Oxygen Therapy: Patient placed on Ventilator (see vital sign flow sheet for setting)  Post-op Assessment: Report given to RN and Post -op Vital signs reviewed and stable  Post vital signs: Reviewed  Last Vitals:  Vitals Value Taken Time  BP 146/82 10/03/2019 1803  Temp    Pulse 106 10/08/2019 1804  Resp 24 09/29/2019 1804  SpO2 92 % 09/15/2019 1804  Vitals shown include unvalidated device data.  Last Pain:  Vitals:   09/23/2019 1030  TempSrc: Axillary         Complications: No apparent anesthesia complications

## 2019-09-16 NOTE — Progress Notes (Signed)
Patient ID: [name], male   DOB: 20-Apr-1961, 58 y.o.   MRN: [id number] I met with his daughter at the bedside and updated her regarding the overall plan and the operative plans for today.  I answered her questions.  She reports that [name] lives with his mother, her grandmother.  She lives down the street.  She is currently working from home.  Georganna Skeans, MD, MPH, FACS Please use [email or web address] to contact on call provider

## 2019-09-16 NOTE — Progress Notes (Signed)
Cortrak Team Note  Consult for Cortrak in place. Patient to go to OR very shortly. RD able to talk with Dr. Grandville Silos (Trauma) who states possible G-tube placement while in surgery and to hold off on placing Cortrak at this time. RD will continue to follow to determine if tube is needed in the future vs if G-tube is placed in the OR.     Jarome Matin, MS, RD, LDN, [facility] Inpatient Clinical Dietitian Pager # [phone] After hours/weekend pager # [phone]

## 2019-09-16 NOTE — Op Note (Signed)
09/21/2019 - 10/05/2019  12:00 PM  PATIENT:  [name]  58 y.o. male  PRE-OPERATIVE DIAGNOSIS: Open abdomen status post exploratory laparotomy  POST-OPERATIVE DIAGNOSIS: Open abdomen status post exploratory laparotomy, extensive pelvic extraperitoneal hematoma as previous, no new injuries identified  PROCEDURE:  Procedure(s): Exploratory Laparotomy Gastrostomy tube 20 [place] Closure of abdomen  SURGEON: Violeta Gelinas, MD  ASSISTANTS: None  ANESTHESIA:   General  EBL:  Total I/O In: 697.6 [I.V.:479.6; Blood:218] Out: 340 [Urine:340]  BLOOD ADMINISTERED:1U CC PRBC  DRAINS: Gastrostomy Tube   SPECIMEN:  No Specimen  DISPOSITION OF SPECIMEN:  N/A  COUNTS:  YES  DICTATION: .Dragon Dictation Findings: Extensive extraperitoneal hematoma involving the pelvis as previous, no bowel injuries, liver and spleen without injury, stomach without injury, no additional injuries found  Procedure in detail: Mr. Alla German is brought back for planned reexploration of his abdomen.  He is status post exploratory laparotomy on 11/4 for hemoperitoneum.  His abdomen is open with an ABThera.  Informed consent was obtained from his daughter is brought directly from the trauma neuro intensive care unit to the operating room on the ventilator.  General anesthesia was administered by the anesthesia staff.  His outer portions of the VAC drape were removed and his abdomen was prepped and draped in a sterile fashion.  Timeout procedure was accomplished.  I removed his inner VAC drape after irrigating it and explored his abdomen.  There was no significant free fluid in the abdomen, mild pink ascites.  No significant hemoperitoneum.  All 4 quadrants were irrigated.  The abdomen was then explored.  The small bowel was run from the ligament of Treitz down to the terminal ileum.  There were no bowel injuries noted.:  Was also intact.  There is extensive extraperitoneal pelvic hematoma as previous including  creeping up along the lateral attachments of the sigmoid colon but no bleeding into the abdomen.  Stomach was intact.  Liver and spleen were normal.  No central retroperitoneal hematomas.  The abdomen was irrigated and then decision was made to place a gastrostomy tube.  I placed 2 concentric 2-0 silk sutures along an appropriate portion of the body of his stomach.  I made a small incision in the left upper quadrant in appropriate position and passed a 20 French gastrostomy tube through that into the abdomen.  I made a small gastrotomy in the G-tube was placed into the stomach.  The inner concentric 2-0 silk pursestring was tied securely.  I inflated the balloon of the catheter.  The outer concentric 2-0 silk pursestring was tied securely.  I then adjusted the tube and flange so it exited nicely in the left upper quadrant with the stomach up to the inside of the abdominal wall.  I sutured it multiple times with interrupted 2-0 silk sutures.  I flushed the tubing and it flushed and drained well without difficulty and there was no leakage.  Bowel was repositioned anatomically and the abdomen was closed with 2 lengths of running #1 looped PDS tied in the middle.  Subcutaneous tissues were irrigated and closed with staples.  I secured the G-tube with 2-0 silk sutures.  All counts were correct.  There were no apparent complications.  He remained in the operating room with Dr. Jena Gauss for his procedure.  Plan will be to place the gastrostomy tube to gravity at this time. PATIENT DISPOSITION:  Remains in the operating room with Dr. Jena Gauss   Delay start of Pharmacological VTE agent (>24hrs) due to surgical blood  loss or risk of bleeding:  yes  Georganna Skeans, MD, MPH, FACS Pager: [phone]  11/6/202012:00 PM

## 2019-09-16 NOTE — Anesthesia Procedure Notes (Signed)
Date/Time: 09/12/2019 10:47 AM Performed by: Colin Benton, CRNA Pre-anesthesia Checklist: Patient identified, Emergency Drugs available, Suction available and Patient being monitored Patient Re-evaluated:Patient Re-evaluated prior to induction Oxygen Delivery Method: Circle system utilized Preoxygenation: Pre-oxygenation with 100% oxygen Induction Type: Inhalational induction with existing ETT Placement Confirmation: positive ETCO2 Dental Injury: Teeth and Oropharynx as per pre-operative assessment

## 2019-09-16 NOTE — Anesthesia Postprocedure Evaluation (Signed)
Anesthesia Post Note  Patient: Shante Maysonet  Procedure(s) Performed: INTRAMEDULLARY (IM) NAIL FEMORAL (Bilateral ) IRRIGATION AND DEBRIDEMENT EXTREMITY (Bilateral ) ORIF PELVIC FRACTURE WITH PERCUTANEOUS SCREWS (Bilateral ) Exploratory Laparotomy     Patient location during evaluation: ICU Anesthesia Type: General Level of consciousness: sedated Pain management: pain level controlled Vital Signs Assessment: post-procedure vital signs reviewed and stable Respiratory status: patient remains intubated per anesthesia plan Cardiovascular status: stable Postop Assessment: no apparent nausea or vomiting Anesthetic complications: no    Last Vitals:  Vitals:   09/23/2019 1900 09/12/2019 1928  BP: (!) 177/83   Pulse: (!) 113 (!) 114  Resp: (!) 24 (!) 24  Temp:    SpO2: 90% 92%    Last Pain:  Vitals:   10/06/2019 1030  TempSrc: Axillary                 Megahn Killings P Zimir Kittleson
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
## 2019-09-17 ENCOUNTER — Inpatient Hospital Stay (HOSPITAL_COMMUNITY): Payer: 59

## 2019-09-17 DIAGNOSIS — S7221XA Displaced subtrochanteric fracture of right femur, initial encounter for closed fracture: Secondary | ICD-10-CM

## 2019-09-17 DIAGNOSIS — S82302B Unspecified fracture of lower end of left tibia, initial encounter for open fracture type I or II: Secondary | ICD-10-CM

## 2019-09-17 DIAGNOSIS — S72142B Displaced intertrochanteric fracture of left femur, initial encounter for open fracture type I or II: Secondary | ICD-10-CM

## 2019-09-17 DIAGNOSIS — S82832B Other fracture of upper and lower end of left fibula, initial encounter for open fracture type I or II: Secondary | ICD-10-CM

## 2019-09-17 DIAGNOSIS — S82871B Displaced pilon fracture of right tibia, initial encounter for open fracture type I or II: Secondary | ICD-10-CM

## 2019-09-17 DIAGNOSIS — S82002B Unspecified fracture of left patella, initial encounter for open fracture type I or II: Secondary | ICD-10-CM | POA: Insufficient documentation

## 2019-09-17 DIAGNOSIS — J9600 Acute respiratory failure, unspecified whether with hypoxia or hypercapnia: Secondary | ICD-10-CM

## 2019-09-17 DIAGNOSIS — G35 Multiple sclerosis: Secondary | ICD-10-CM

## 2019-09-17 DIAGNOSIS — S32451A Displaced transverse fracture of right acetabulum, initial encounter for closed fracture: Secondary | ICD-10-CM

## 2019-09-17 DIAGNOSIS — D62 Acute posthemorrhagic anemia: Secondary | ICD-10-CM

## 2019-09-17 DIAGNOSIS — S329XXA Fracture of unspecified parts of lumbosacral spine and pelvis, initial encounter for closed fracture: Secondary | ICD-10-CM

## 2019-09-17 DIAGNOSIS — Z86718 Personal history of other venous thrombosis and embolism: Secondary | ICD-10-CM

## 2019-09-17 LAB — GLUCOSE, CAPILLARY
Glucose-Capillary: 118 mg/dL — ABNORMAL HIGH (ref 70–99)
Glucose-Capillary: 120 mg/dL — ABNORMAL HIGH (ref 70–99)
Glucose-Capillary: 79 mg/dL (ref 70–99)
Glucose-Capillary: 88 mg/dL (ref 70–99)
Glucose-Capillary: 90 mg/dL (ref 70–99)

## 2019-09-17 LAB — POCT I-STAT 7, (LYTES, BLD GAS, ICA,H+H)
Acid-base deficit: 6 mmol/L — ABNORMAL HIGH (ref 0.0–2.0)
Bicarbonate: 18.8 mmol/L — ABNORMAL LOW (ref 20.0–28.0)
Calcium, Ion: 0.97 mmol/L — ABNORMAL LOW (ref 1.15–1.40)
HCT: 22 % — ABNORMAL LOW (ref 39.0–52.0)
Hemoglobin: 7.5 g/dL — ABNORMAL LOW (ref 13.0–17.0)
O2 Saturation: 98 %
Potassium: 2.8 mmol/L — ABNORMAL LOW (ref 3.5–5.1)
Sodium: 150 mmol/L — ABNORMAL HIGH (ref 135–145)
TCO2: 20 mmol/L — ABNORMAL LOW (ref 22–32)
pCO2 arterial: 32.4 mmHg (ref 32.0–48.0)
pH, Arterial: 7.37 (ref 7.350–7.450)
pO2, Arterial: 99 mmHg (ref 83.0–108.0)

## 2019-09-17 LAB — MAGNESIUM
Magnesium: 1.4 mg/dL — ABNORMAL LOW (ref 1.7–2.4)
Magnesium: 1.4 mg/dL — ABNORMAL LOW (ref 1.7–2.4)

## 2019-09-17 LAB — BASIC METABOLIC PANEL
Anion gap: 9 (ref 5–15)
BUN: 10 mg/dL (ref 6–20)
CO2: 19 mmol/L — ABNORMAL LOW (ref 22–32)
Calcium: 7.2 mg/dL — ABNORMAL LOW (ref 8.9–10.3)
Chloride: 120 mmol/L — ABNORMAL HIGH (ref 98–111)
Creatinine, Ser: 0.91 mg/dL (ref 0.61–1.24)
GFR calc Af Amer: >60 mL/min (ref >60)
GFR calc non Af Amer: >60 mL/min (ref >60)
Glucose, Bld: 87 mg/dL (ref 70–99)
Potassium: 2.8 mmol/L — ABNORMAL LOW (ref 3.5–5.1)
Sodium: 148 mmol/L — ABNORMAL HIGH (ref 135–145)

## 2019-09-17 LAB — BPAM PLATELET PHERESIS
Blood Product Expiration Date: 202011072359
ISSUE DATE / TIME: 202011060908
Unit Type and Rh: 6200

## 2019-09-17 LAB — CBC
HCT: 27.6 % — ABNORMAL LOW (ref 39.0–52.0)
Hemoglobin: 9.3 g/dL — ABNORMAL LOW (ref 13.0–17.0)
MCH: 29.5 pg (ref 26.0–34.0)
MCHC: 33.7 g/dL (ref 30.0–36.0)
MCV: 87.6 fL (ref 80.0–100.0)
Platelets: 65 10*3/uL — ABNORMAL LOW (ref 150–400)
RBC: 3.15 MIL/uL — ABNORMAL LOW (ref 4.22–5.81)
RDW: 15.9 % — ABNORMAL HIGH (ref 11.5–15.5)
WBC: 8.9 10*3/uL (ref 4.0–10.5)
nRBC: 0.7 % — ABNORMAL HIGH (ref 0.0–0.2)

## 2019-09-17 LAB — PREPARE PLATELET PHERESIS: Unit division: 0

## 2019-09-17 LAB — PHOSPHORUS
Phosphorus: 2.1 mg/dL — ABNORMAL LOW (ref 2.5–4.6)
Phosphorus: 1.6 mg/dL — ABNORMAL LOW (ref 2.5–4.6)

## 2019-09-17 LAB — PREPARE RBC (CROSSMATCH)

## 2019-09-17 MED ORDER — SODIUM CHLORIDE 0.9% IV SOLUTION: Freq: Once | INTRAVENOUS | Status: DC

## 2019-09-17 MED ORDER — PIVOT 1.5 CAL PO LIQD
1000.0000 mL | ORAL | Status: DC
  Administered 2019-09-17 – 2019-09-19 (×3): 1000 mL
  Filled 2019-09-17 (×2): qty 1000

## 2019-09-17 MED ORDER — FUROSEMIDE 10 MG/ML IJ SOLN
20.0000 mg | Freq: Once | INTRAMUSCULAR | Status: AC
  Administered 2019-09-17: 20 mg via INTRAVENOUS
  Filled 2019-09-17: qty 2

## 2019-09-17 MED ORDER — VITAL HIGH PROTEIN PO LIQD: 1000.0000 mL | ORAL | Status: DC

## 2019-09-17 MED ORDER — PIVOT 1.5 CAL PO LIQD: 1000.0000 mL | ORAL | Status: DC

## 2019-09-17 MED ORDER — POTASSIUM CHLORIDE 10 MEQ/50ML IV SOLN
10.0000 meq | INTRAVENOUS | Status: AC
  Administered 2019-09-17 (×5): 10 meq via INTRAVENOUS
  Filled 2019-09-17 (×5): qty 50
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
## 2019-09-17 NOTE — Plan of Care (Signed)
  Problem: Clinical Measurements: Goal: Diagnostic test results will improve Outcome: Progressing   

## 2019-09-18 ENCOUNTER — Inpatient Hospital Stay (HOSPITAL_COMMUNITY): Payer: 59

## 2019-09-18 LAB — BPAM RBC
Blood Product Expiration Date: 202011132359
Blood Product Expiration Date: 202011132359
Blood Product Expiration Date: 202011202359
Blood Product Expiration Date: 202011222359
Blood Product Expiration Date: 202011222359
Blood Product Expiration Date: 202011222359
Blood Product Expiration Date: 202011222359
Blood Product Expiration Date: 202011222359
Blood Product Expiration Date: 202011242359
Blood Product Expiration Date: 202011272359
Blood Product Expiration Date: 202011272359
Blood Product Expiration Date: 202011272359
Blood Product Expiration Date: 202011282359
Blood Product Expiration Date: 202011282359
Blood Product Expiration Date: 202011302359
Blood Product Expiration Date: 202011302359
Blood Product Expiration Date: 202012012359
Blood Product Expiration Date: 202012012359
Blood Product Expiration Date: 202012012359
Blood Product Expiration Date: 202012042359
Blood Product Expiration Date: 202012042359
Blood Product Expiration Date: 202012042359
Blood Product Expiration Date: 202012042359
Blood Product Expiration Date: 202012042359
Blood Product Expiration Date: 202012052359
Blood Product Expiration Date: 202012052359
Blood Product Expiration Date: 202012052359
Blood Product Expiration Date: 202012052359
Blood Product Expiration Date: 202012052359
Blood Product Expiration Date: 202012052359
Blood Product Expiration Date: 202012052359
Blood Product Expiration Date: 202012052359
Blood Product Expiration Date: 202012072359
Blood Product Expiration Date: 202012072359
Blood Product Expiration Date: 202012072359
Blood Product Expiration Date: 202012092359
Blood Product Expiration Date: 202012102359
Blood Product Expiration Date: 202012102359
Blood Product Expiration Date: 202012102359
Blood Product Expiration Date: 202012112359
Blood Product Expiration Date: 202012112359
Blood Product Expiration Date: 202012112359
Blood Product Expiration Date: 202012112359
Blood Product Expiration Date: 202012122359
Blood Product Expiration Date: 202012112359
ISSUE DATE / TIME: 202011031623
ISSUE DATE / TIME: 202011031635
ISSUE DATE / TIME: 202011031647
ISSUE DATE / TIME: 202011031647
ISSUE DATE / TIME: 202011031836
ISSUE DATE / TIME: 202011031836
ISSUE DATE / TIME: 202011031954
ISSUE DATE / TIME: 202011031954
ISSUE DATE / TIME: 202011031954
ISSUE DATE / TIME: 202011031954
ISSUE DATE / TIME: 202011032036
ISSUE DATE / TIME: 202011032036
ISSUE DATE / TIME: 202011032036
ISSUE DATE / TIME: 202011032036
ISSUE DATE / TIME: 202011032051
ISSUE DATE / TIME: 202011032051
ISSUE DATE / TIME: 202011032051
ISSUE DATE / TIME: 202011032051
ISSUE DATE / TIME: 202011032055
ISSUE DATE / TIME: 202011032055
ISSUE DATE / TIME: 202011032101
ISSUE DATE / TIME: 202011032101
ISSUE DATE / TIME: 202011032101
ISSUE DATE / TIME: 202011032101
ISSUE DATE / TIME: 202011032113
ISSUE DATE / TIME: 202011032113
ISSUE DATE / TIME: 202011032119
ISSUE DATE / TIME: 202011032119
ISSUE DATE / TIME: 202011040205
ISSUE DATE / TIME: 202011041228
ISSUE DATE / TIME: 202011041259
ISSUE DATE / TIME: 202011041551
ISSUE DATE / TIME: 202011041939
ISSUE DATE / TIME: 202011060154
ISSUE DATE / TIME: 202011060800
ISSUE DATE / TIME: 202011060824
ISSUE DATE / TIME: 202011060910
ISSUE DATE / TIME: 202011061151
ISSUE DATE / TIME: 202011061151
ISSUE DATE / TIME: 202011061836
ISSUE DATE / TIME: 202011071111
ISSUE DATE / TIME: 202011071756
ISSUE DATE / TIME: 202011072107
ISSUE DATE / TIME: 202011071111
Unit Type and Rh: 1700
Unit Type and Rh: 1700
Unit Type and Rh: 1700
Unit Type and Rh: 5100
Unit Type and Rh: 5100
Unit Type and Rh: 5100
Unit Type and Rh: 5100
Unit Type and Rh: 5100
Unit Type and Rh: 5100
Unit Type and Rh: 5100
Unit Type and Rh: 5100
Unit Type and Rh: 5100
Unit Type and Rh: 5100
Unit Type and Rh: 5100
Unit Type and Rh: 7300
Unit Type and Rh: 7300
Unit Type and Rh: 7300
Unit Type and Rh: 7300
Unit Type and Rh: 7300
Unit Type and Rh: 7300
Unit Type and Rh: 7300
Unit Type and Rh: 7300
Unit Type and Rh: 7300
Unit Type and Rh: 7300
Unit Type and Rh: 7300
Unit Type and Rh: 7300
Unit Type and Rh: 7300
Unit Type and Rh: 7300
Unit Type and Rh: 7300
Unit Type and Rh: 7300
Unit Type and Rh: 7300
Unit Type and Rh: 7300
Unit Type and Rh: 7300
Unit Type and Rh: 7300
Unit Type and Rh: 7300
Unit Type and Rh: 7300
Unit Type and Rh: 7300
Unit Type and Rh: 7300
Unit Type and Rh: 7300
Unit Type and Rh: 7300
Unit Type and Rh: 7300
Unit Type and Rh: 7300
Unit Type and Rh: 7300
Unit Type and Rh: 9500
Unit Type and Rh: 7300

## 2019-09-18 LAB — TYPE AND SCREEN
ABO/RH(D): B POS
ABO/RH(D): B POS
Antibody Screen: NEGATIVE
Antibody Screen: NEGATIVE
Unit division: 0
Unit division: 0
Unit division: 0
Unit division: 0
Unit division: 0
Unit division: 0
Unit division: 0
Unit division: 0
Unit division: 0
Unit division: 0
Unit division: 0
Unit division: 0
Unit division: 0
Unit division: 0
Unit division: 0
Unit division: 0
Unit division: 0
Unit division: 0
Unit division: 0
Unit division: 0
Unit division: 0
Unit division: 0
Unit division: 0
Unit division: 0
Unit division: 0
Unit division: 0
Unit division: 0
Unit division: 0
Unit division: 0
Unit division: 0
Unit division: 0
Unit division: 0
Unit division: 0
Unit division: 0
Unit division: 0
Unit division: 0
Unit division: 0
Unit division: 0
Unit division: 0
Unit division: 0
Unit division: 0
Unit division: 0
Unit division: 0
Unit division: 0
Unit division: 0

## 2019-09-18 LAB — POCT I-STAT 7, (LYTES, BLD GAS, ICA,H+H)
Acid-base deficit: 4 mmol/L — ABNORMAL HIGH (ref 0.0–2.0)
Bicarbonate: 19.8 mmol/L — ABNORMAL LOW (ref 20.0–28.0)
Calcium, Ion: 1.02 mmol/L — ABNORMAL LOW (ref 1.15–1.40)
HCT: 24 % — ABNORMAL LOW (ref 39.0–52.0)
Hemoglobin: 8.2 g/dL — ABNORMAL LOW (ref 13.0–17.0)
O2 Saturation: 95 %
Potassium: 2.8 mmol/L — ABNORMAL LOW (ref 3.5–5.1)
Sodium: 151 mmol/L — ABNORMAL HIGH (ref 135–145)
TCO2: 21 mmol/L — ABNORMAL LOW (ref 22–32)
pCO2 arterial: 31.1 mmHg — ABNORMAL LOW (ref 32.0–48.0)
pH, Arterial: 7.412 (ref 7.350–7.450)
pO2, Arterial: 73 mmHg — ABNORMAL LOW (ref 83.0–108.0)

## 2019-09-18 LAB — CBC
HCT: 27.7 % — ABNORMAL LOW (ref 39.0–52.0)
Hemoglobin: 9.5 g/dL — ABNORMAL LOW (ref 13.0–17.0)
MCH: 29.8 pg (ref 26.0–34.0)
MCHC: 34.3 g/dL (ref 30.0–36.0)
MCV: 86.8 fL (ref 80.0–100.0)
Platelets: 69 10*3/uL — ABNORMAL LOW (ref 150–400)
RBC: 3.19 MIL/uL — ABNORMAL LOW (ref 4.22–5.81)
RDW: 15.8 % — ABNORMAL HIGH (ref 11.5–15.5)
WBC: 8.8 10*3/uL (ref 4.0–10.5)
nRBC: 2.3 % — ABNORMAL HIGH (ref 0.0–0.2)

## 2019-09-18 LAB — BASIC METABOLIC PANEL
Anion gap: 7 (ref 5–15)
BUN: 13 mg/dL (ref 6–20)
CO2: 22 mmol/L (ref 22–32)
Calcium: 7 mg/dL — ABNORMAL LOW (ref 8.9–10.3)
Chloride: 122 mmol/L — ABNORMAL HIGH (ref 98–111)
Creatinine, Ser: 0.8 mg/dL (ref 0.61–1.24)
GFR calc Af Amer: >60 mL/min (ref >60)
GFR calc non Af Amer: >60 mL/min (ref >60)
Glucose, Bld: 145 mg/dL — ABNORMAL HIGH (ref 70–99)
Potassium: 2.8 mmol/L — ABNORMAL LOW (ref 3.5–5.1)
Sodium: 151 mmol/L — ABNORMAL HIGH (ref 135–145)

## 2019-09-18 LAB — GLUCOSE, CAPILLARY
Glucose-Capillary: 122 mg/dL — ABNORMAL HIGH (ref 70–99)
Glucose-Capillary: 123 mg/dL — ABNORMAL HIGH (ref 70–99)
Glucose-Capillary: 129 mg/dL — ABNORMAL HIGH (ref 70–99)
Glucose-Capillary: 141 mg/dL — ABNORMAL HIGH (ref 70–99)
Glucose-Capillary: 141 mg/dL — ABNORMAL HIGH (ref 70–99)
Glucose-Capillary: 151 mg/dL — ABNORMAL HIGH (ref 70–99)

## 2019-09-18 LAB — PHOSPHORUS
Phosphorus: 1.3 mg/dL — ABNORMAL LOW (ref 2.5–4.6)
Phosphorus: 1.2 mg/dL — ABNORMAL LOW (ref 2.5–4.6)

## 2019-09-18 LAB — MAGNESIUM
Magnesium: 1.7 mg/dL (ref 1.7–2.4)
Magnesium: 1.9 mg/dL (ref 1.7–2.4)

## 2019-09-18 MED ORDER — FUROSEMIDE 10 MG/ML IJ SOLN
20.0000 mg | Freq: Once | INTRAMUSCULAR | Status: AC
  Administered 2019-09-18: 20 mg via INTRAVENOUS
  Filled 2019-09-18: qty 2

## 2019-09-18 MED ORDER — FREE WATER
200.0000 mL | Freq: Three times a day (TID) | Status: DC
  Administered 2019-09-18 – 2019-09-23 (×14): 200 mL

## 2019-09-18 MED ORDER — POTASSIUM CHLORIDE 10 MEQ/50ML IV SOLN
10.0000 meq | INTRAVENOUS | Status: AC
  Administered 2019-09-18 (×2): 10 meq via INTRAVENOUS
  Filled 2019-09-18 (×2): qty 50

## 2019-09-18 MED ORDER — POTASSIUM CHLORIDE 20 MEQ/15ML (10%) PO SOLN
40.0000 meq | Freq: Two times a day (BID) | ORAL | Status: DC
  Administered 2019-09-18 – 2019-09-24 (×14): 40 meq
  Filled 2019-09-18 (×15): qty 30
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
## 2019-09-18 NOTE — Plan of Care (Signed)
  Problem: Clinical Measurements: Goal: Diagnostic test results will improve 09/18/2019 0707 by Aurora Mask, RN Outcome: Progressing 09/18/2019 0707 by Aurora Mask, RN Outcome: Progressing

## 2019-09-19 ENCOUNTER — Inpatient Hospital Stay (HOSPITAL_COMMUNITY): Payer: 59 | Admitting: Certified Registered Nurse Anesthetist

## 2019-09-19 ENCOUNTER — Encounter (HOSPITAL_COMMUNITY): Payer: Self-pay | Admitting: Student

## 2019-09-19 ENCOUNTER — Inpatient Hospital Stay (HOSPITAL_COMMUNITY): Payer: 59

## 2019-09-19 ENCOUNTER — Encounter (HOSPITAL_COMMUNITY): Admission: EM | Disposition: E | Payer: Self-pay | Source: Home / Self Care

## 2019-09-19 HISTORY — PX: OPEN REDUCTION INTERNAL FIXATION (ORIF) TIBIA/FIBULA FRACTURE: SHX5992

## 2019-09-19 HISTORY — PX: I&D EXTREMITY: SHX5045

## 2019-09-19 HISTORY — PX: EXTERNAL FIXATION REMOVAL: SHX5040

## 2019-09-19 LAB — POCT I-STAT 7, (LYTES, BLD GAS, ICA,H+H)
Acid-base deficit: 1 mmol/L (ref 0.0–2.0)
Bicarbonate: 23.9 mmol/L (ref 20.0–28.0)
Calcium, Ion: 1.09 mmol/L — ABNORMAL LOW (ref 1.15–1.40)
HCT: 26 % — ABNORMAL LOW (ref 39.0–52.0)
Hemoglobin: 8.8 g/dL — ABNORMAL LOW (ref 13.0–17.0)
O2 Saturation: 96 %
Potassium: 3.2 mmol/L — ABNORMAL LOW (ref 3.5–5.1)
Sodium: 153 mmol/L — ABNORMAL HIGH (ref 135–145)
TCO2: 25 mmol/L (ref 22–32)
pCO2 arterial: 39.5 mmHg (ref 32.0–48.0)
pH, Arterial: 7.389 (ref 7.350–7.450)
pO2, Arterial: 84 mmHg (ref 83.0–108.0)

## 2019-09-19 LAB — GLUCOSE, CAPILLARY
Glucose-Capillary: 123 mg/dL — ABNORMAL HIGH (ref 70–99)
Glucose-Capillary: 116 mg/dL — ABNORMAL HIGH (ref 70–99)
Glucose-Capillary: 125 mg/dL — ABNORMAL HIGH (ref 70–99)
Glucose-Capillary: 140 mg/dL — ABNORMAL HIGH (ref 70–99)
Glucose-Capillary: 126 mg/dL — ABNORMAL HIGH (ref 70–99)

## 2019-09-19 LAB — CBC
HCT: 27.5 % — ABNORMAL LOW (ref 39.0–52.0)
Hemoglobin: 9.2 g/dL — ABNORMAL LOW (ref 13.0–17.0)
MCH: 29.3 pg (ref 26.0–34.0)
MCHC: 33.5 g/dL (ref 30.0–36.0)
MCV: 87.6 fL (ref 80.0–100.0)
Platelets: 81 10*3/uL — ABNORMAL LOW (ref 150–400)
RBC: 3.14 MIL/uL — ABNORMAL LOW (ref 4.22–5.81)
RDW: 16 % — ABNORMAL HIGH (ref 11.5–15.5)
WBC: 6.4 10*3/uL (ref 4.0–10.5)
nRBC: 4.1 % — ABNORMAL HIGH (ref 0.0–0.2)

## 2019-09-19 LAB — BASIC METABOLIC PANEL
Anion gap: 6 (ref 5–15)
BUN: 17 mg/dL (ref 6–20)
CO2: 24 mmol/L (ref 22–32)
Calcium: 7.3 mg/dL — ABNORMAL LOW (ref 8.9–10.3)
Chloride: 120 mmol/L — ABNORMAL HIGH (ref 98–111)
Creatinine, Ser: 0.68 mg/dL (ref 0.61–1.24)
GFR calc Af Amer: >60 mL/min (ref >60)
GFR calc non Af Amer: >60 mL/min (ref >60)
Glucose, Bld: 139 mg/dL — ABNORMAL HIGH (ref 70–99)
Potassium: 3.1 mmol/L — ABNORMAL LOW (ref 3.5–5.1)
Sodium: 150 mmol/L — ABNORMAL HIGH (ref 135–145)

## 2019-09-19 LAB — ABO/RH: ABO/RH(D): B POS

## 2019-09-19 LAB — TRIGLYCERIDES: Triglycerides: 73 mg/dL (ref <150)

## 2019-09-19 SURGERY — OPEN REDUCTION INTERNAL FIXATION (ORIF) TIBIA/FIBULA FRACTURE
Anesthesia: General | Site: Leg Lower | Laterality: Left

## 2019-09-19 MED ORDER — FUROSEMIDE 10 MG/ML IJ SOLN
40.0000 mg | Freq: Once | INTRAMUSCULAR | Status: AC
  Administered 2019-09-19: 40 mg via INTRAVENOUS
  Filled 2019-09-19: qty 4

## 2019-09-19 MED ORDER — ROCURONIUM BROMIDE 10 MG/ML (PF) SYRINGE
PREFILLED_SYRINGE | INTRAVENOUS | Status: AC
  Filled 2019-09-19: qty 10

## 2019-09-19 MED ORDER — CEFAZOLIN SODIUM-DEXTROSE 2-4 GM/100ML-% IV SOLN
2.0000 g | Freq: Three times a day (TID) | INTRAVENOUS | Status: AC
  Administered 2019-09-19 – 2019-09-20 (×3): 2 g via INTRAVENOUS
  Filled 2019-09-19 (×3): qty 100

## 2019-09-19 MED ORDER — MIDAZOLAM HCL 2 MG/2ML IJ SOLN
INTRAMUSCULAR | Status: AC
  Filled 2019-09-19: qty 2

## 2019-09-19 MED ORDER — TOPIRAMATE 100 MG PO TABS
100.0000 mg | ORAL_TABLET | Freq: Every day | ORAL | Status: DC
  Administered 2019-09-19 – 2019-09-24 (×6): 100 mg
  Filled 2019-09-19 (×6): qty 1

## 2019-09-19 MED ORDER — DEXAMETHASONE SODIUM PHOSPHATE 10 MG/ML IJ SOLN
INTRAMUSCULAR | Status: DC | PRN
  Administered 2019-09-19: 10 mg via INTRAVENOUS

## 2019-09-19 MED ORDER — SERTRALINE HCL 50 MG PO TABS
50.0000 mg | ORAL_TABLET | Freq: Every day | ORAL | Status: DC
  Administered 2019-09-20 – 2019-09-24 (×5): 50 mg
  Filled 2019-09-19 (×6): qty 1

## 2019-09-19 MED ORDER — FENTANYL CITRATE (PF) 250 MCG/5ML IJ SOLN
INTRAMUSCULAR | Status: AC
  Filled 2019-09-19: qty 5

## 2019-09-19 MED ORDER — TOBRAMYCIN SULFATE 1.2 G IJ SOLR
INTRAMUSCULAR | Status: AC
  Filled 2019-09-19: qty 1.2

## 2019-09-19 MED ORDER — SODIUM CHLORIDE 0.9 % IR SOLN
Status: DC | PRN
  Administered 2019-09-19 (×2): 3000 mL

## 2019-09-19 MED ORDER — BACLOFEN 10 MG PO TABS
20.0000 mg | ORAL_TABLET | Freq: Four times a day (QID) | ORAL | Status: DC
  Administered 2019-09-19 – 2019-09-27 (×27): 20 mg
  Filled 2019-09-19 (×26): qty 2

## 2019-09-19 MED ORDER — PHENYLEPHRINE 40 MCG/ML (10ML) SYRINGE FOR IV PUSH (FOR BLOOD PRESSURE SUPPORT)
PREFILLED_SYRINGE | INTRAVENOUS | Status: DC | PRN
  Administered 2019-09-19: 80 ug via INTRAVENOUS
  Administered 2019-09-19: 120 ug via INTRAVENOUS
  Administered 2019-09-19: 80 ug via INTRAVENOUS

## 2019-09-19 MED ORDER — SODIUM CHLORIDE (PF) 0.9 % IJ SOLN
INTRAMUSCULAR | Status: AC
  Filled 2019-09-19: qty 10

## 2019-09-19 MED ORDER — OXCARBAZEPINE 300 MG PO TABS
300.0000 mg | ORAL_TABLET | Freq: Three times a day (TID) | ORAL | Status: DC
  Administered 2019-09-19 – 2019-09-27 (×18): 300 mg
  Filled 2019-09-19 (×25): qty 1

## 2019-09-19 MED ORDER — CEFAZOLIN SODIUM 1 G IJ SOLR
INTRAMUSCULAR | Status: AC
  Filled 2019-09-19: qty 20

## 2019-09-19 MED ORDER — FENTANYL CITRATE (PF) 250 MCG/5ML IJ SOLN
INTRAMUSCULAR | Status: DC | PRN
  Administered 2019-09-19: 50 ug via INTRAVENOUS

## 2019-09-19 MED ORDER — VANCOMYCIN HCL 1000 MG IV SOLR
INTRAVENOUS | Status: AC
  Filled 2019-09-19: qty 1000

## 2019-09-19 MED ORDER — OXCARBAZEPINE 300 MG PO TABS
300.0000 mg | ORAL_TABLET | Freq: Three times a day (TID) | ORAL | Status: DC
  Administered 2019-09-19: 300 mg via ORAL
  Filled 2019-09-19 (×2): qty 1

## 2019-09-19 MED ORDER — 0.9 % SODIUM CHLORIDE (POUR BTL) OPTIME
TOPICAL | Status: DC | PRN
  Administered 2019-09-19: 1000 mL

## 2019-09-19 MED ORDER — VASOPRESSIN 20 UNIT/ML IV SOLN
INTRAVENOUS | Status: DC | PRN
  Administered 2019-09-19: 2 [IU] via INTRAVENOUS

## 2019-09-19 MED ORDER — ALBUMIN HUMAN 5 % IV SOLN
INTRAVENOUS | Status: DC | PRN
  Administered 2019-09-19 (×2): via INTRAVENOUS

## 2019-09-19 MED ORDER — ROCURONIUM BROMIDE 10 MG/ML (PF) SYRINGE
PREFILLED_SYRINGE | INTRAVENOUS | Status: DC | PRN
  Administered 2019-09-19: 50 mg via INTRAVENOUS
  Administered 2019-09-19: 30 mg via INTRAVENOUS
  Administered 2019-09-19 (×2): 50 mg via INTRAVENOUS

## 2019-09-19 MED ORDER — TOPIRAMATE 100 MG PO TABS: 100.0000 mg | ORAL_TABLET | Freq: Every day | ORAL | Status: DC

## 2019-09-19 MED ORDER — HEPARIN (PORCINE) 25000 UT/250ML-% IV SOLN
2050.0000 [IU]/h | INTRAVENOUS | Status: DC
  Administered 2019-09-19: 18:00:00 1050 [IU]/h via INTRAVENOUS
  Administered 2019-09-20: 1450 [IU]/h via INTRAVENOUS
  Administered 2019-09-21 – 2019-09-23 (×4): 1850 [IU]/h via INTRAVENOUS
  Administered 2019-09-24: 1600 [IU]/h via INTRAVENOUS
  Administered 2019-09-24: 16:00:00 1800 [IU]/h via INTRAVENOUS
  Administered 2019-09-25: 1900 [IU]/h via INTRAVENOUS
  Filled 2019-09-19 (×10): qty 250

## 2019-09-19 MED ORDER — PROPOFOL 10 MG/ML IV BOLUS
INTRAVENOUS | Status: DC | PRN
  Administered 2019-09-19: 50 mg via INTRAVENOUS

## 2019-09-19 MED ORDER — POTASSIUM CHLORIDE 10 MEQ/50ML IV SOLN
10.0000 meq | INTRAVENOUS | Status: AC
  Administered 2019-09-19 (×4): 10 meq via INTRAVENOUS
  Filled 2019-09-19 (×4): qty 50

## 2019-09-19 MED ORDER — PROPOFOL 1000 MG/100ML IV EMUL
5.0000 ug/kg/min | INTRAVENOUS | Status: DC
  Administered 2019-09-19: 25 ug/kg/min via INTRAVENOUS
  Administered 2019-09-19 – 2019-09-20 (×2): 20 ug/kg/min via INTRAVENOUS
  Filled 2019-09-19 (×3): qty 100

## 2019-09-19 MED ORDER — GABAPENTIN 600 MG PO TABS
600.0000 mg | ORAL_TABLET | Freq: Three times a day (TID) | ORAL | Status: DC
  Administered 2019-09-19 – 2019-09-24 (×16): 600 mg
  Filled 2019-09-19 (×18): qty 1

## 2019-09-19 MED ORDER — FUROSEMIDE 10 MG/ML IJ SOLN
40.0000 mg | Freq: Once | INTRAMUSCULAR | Status: AC
  Administered 2019-09-20: 40 mg via INTRAVENOUS
  Filled 2019-09-19: qty 4

## 2019-09-19 MED ORDER — TOBRAMYCIN SULFATE 1.2 G IJ SOLR
INTRAMUSCULAR | Status: DC | PRN
  Administered 2019-09-19: 1.2 g

## 2019-09-19 MED ORDER — MIDAZOLAM HCL 2 MG/2ML IJ SOLN
INTRAMUSCULAR | Status: DC | PRN
  Administered 2019-09-19: 2 mg via INTRAVENOUS

## 2019-09-19 MED ORDER — LACTATED RINGERS IV SOLN
INTRAVENOUS | Status: DC | PRN
  Administered 2019-09-19: 11:00:00 via INTRAVENOUS

## 2019-09-19 MED ORDER — VANCOMYCIN HCL 1000 MG IV SOLR
INTRAVENOUS | Status: DC | PRN
  Administered 2019-09-19: 1000 mg

## 2019-09-19 MED ORDER — PROPOFOL 500 MG/50ML IV EMUL
INTRAVENOUS | Status: DC | PRN
  Administered 2019-09-19: 75 ug/kg/min via INTRAVENOUS

## 2019-09-19 MED ORDER — ONDANSETRON HCL 4 MG/2ML IJ SOLN
INTRAMUSCULAR | Status: DC | PRN
  Administered 2019-09-19: 4 mg via INTRAVENOUS

## 2019-09-19 MED ORDER — SERTRALINE HCL 50 MG PO TABS
50.0000 mg | ORAL_TABLET | Freq: Every day | ORAL | Status: DC
  Administered 2019-09-19: 50 mg via ORAL
  Filled 2019-09-19: qty 1

## 2019-09-19 MED ORDER — HYDROCORTISONE NA SUCCINATE PF 100 MG IJ SOLR
50.0000 mg | Freq: Three times a day (TID) | INTRAMUSCULAR | Status: DC
  Administered 2019-09-19 – 2019-09-20 (×2): 50 mg via INTRAVENOUS
  Filled 2019-09-19 (×2): qty 2

## 2019-09-19 MED ORDER — VASOPRESSIN 20 UNIT/ML IV SOLN
INTRAVENOUS | Status: AC
  Filled 2019-09-19: qty 1

## 2019-09-19 MED ORDER — CALCIUM GLUCONATE-NACL 2-0.675 GM/100ML-% IV SOLN
2.0000 g | Freq: Once | INTRAVENOUS | Status: AC
  Administered 2019-09-19: 2000 mg via INTRAVENOUS
  Filled 2019-09-19: qty 100

## 2019-09-19 MED ORDER — GABAPENTIN 600 MG PO TABS
600.0000 mg | ORAL_TABLET | Freq: Three times a day (TID) | ORAL | Status: DC
  Administered 2019-09-19: 600 mg via ORAL
  Filled 2019-09-19 (×2): qty 1

## 2019-09-19 MED ORDER — BACLOFEN 10 MG PO TABS
20.0000 mg | ORAL_TABLET | Freq: Four times a day (QID) | ORAL | Status: DC
  Filled 2019-09-19: qty 2

## 2019-09-19 MED ORDER — CEFAZOLIN SODIUM-DEXTROSE 2-3 GM-%(50ML) IV SOLR
INTRAVENOUS | Status: DC | PRN
  Administered 2019-09-19: 2 g via INTRAVENOUS

## 2019-09-19 SURGICAL SUPPLY — 83 items
BANDAGE ESMARK 6X9 LF (GAUZE/BANDAGES/DRESSINGS) IMPLANT
BIT DRILL QC 2X140 (BIT) ×4 IMPLANT
BIT DRILL QC SFS 2.5X170 (BIT) ×4 IMPLANT
BLADE SURG 10 STRL SS (BLADE) IMPLANT
BNDG COHESIVE 4X5 TAN STRL (GAUZE/BANDAGES/DRESSINGS) ×4 IMPLANT
BNDG ELASTIC 4X5.8 VLCR STR LF (GAUZE/BANDAGES/DRESSINGS) ×4 IMPLANT
BNDG ELASTIC 6X15 VLCR STRL LF (GAUZE/BANDAGES/DRESSINGS) ×8 IMPLANT
BNDG ELASTIC 6X5.8 VLCR STR LF (GAUZE/BANDAGES/DRESSINGS) ×4 IMPLANT
BNDG ESMARK 6X9 LF (GAUZE/BANDAGES/DRESSINGS)
BNDG GAUZE ELAST 4 BULKY (GAUZE/BANDAGES/DRESSINGS) ×4 IMPLANT
BRUSH SCRUB EZ PLAIN DRY (MISCELLANEOUS) ×16 IMPLANT
CHLORAPREP W/TINT 26 (MISCELLANEOUS) ×4 IMPLANT
COVER SURGICAL LIGHT HANDLE (MISCELLANEOUS) ×4 IMPLANT
COVER WAND RF STERILE (DRAPES) IMPLANT
DRAPE C-ARM 42X72 X-RAY (DRAPES) ×4 IMPLANT
DRAPE C-ARMOR (DRAPES) ×4 IMPLANT
DRAPE HALF SHEET 40X57 (DRAPES) IMPLANT
DRAPE IMP U-DRAPE 54X76 (DRAPES) ×4 IMPLANT
DRAPE INCISE IOBAN 66X45 STRL (DRAPES) IMPLANT
DRAPE ORTHO SPLIT 77X108 STRL (DRAPES) ×2
DRAPE SURG ORHT 6 SPLT 77X108 (DRAPES) ×2 IMPLANT
DRAPE U-SHAPE 47X51 STRL (DRAPES) ×8 IMPLANT
DRSG ADAPTIC 3X8 NADH LF (GAUZE/BANDAGES/DRESSINGS) IMPLANT
DRSG MEPILEX BORDER 4X8 (GAUZE/BANDAGES/DRESSINGS) ×8 IMPLANT
DRSG MEPITEL 4X7.2 (GAUZE/BANDAGES/DRESSINGS) ×12 IMPLANT
ELECT REM PT RETURN 9FT ADLT (ELECTROSURGICAL) ×4
ELECTRODE REM PT RTRN 9FT ADLT (ELECTROSURGICAL) ×2 IMPLANT
GAUZE SPONGE 4X4 12PLY STRL (GAUZE/BANDAGES/DRESSINGS) ×8 IMPLANT
GLOVE BIO SURGEON STRL SZ 6.5 (GLOVE) ×9 IMPLANT
GLOVE BIO SURGEON STRL SZ7.5 (GLOVE) ×24 IMPLANT
GLOVE BIO SURGEONS STRL SZ 6.5 (GLOVE) ×3
GLOVE BIOGEL PI IND STRL 6.5 (GLOVE) ×2 IMPLANT
GLOVE BIOGEL PI IND STRL 7.5 (GLOVE) ×2 IMPLANT
GLOVE BIOGEL PI INDICATOR 6.5 (GLOVE) ×2
GLOVE BIOGEL PI INDICATOR 7.5 (GLOVE) ×2
GOWN STRL REUS W/ TWL LRG LVL3 (GOWN DISPOSABLE) ×4 IMPLANT
GOWN STRL REUS W/TWL LRG LVL3 (GOWN DISPOSABLE) ×4
KIT BASIN OR (CUSTOM PROCEDURE TRAY) ×4 IMPLANT
KIT TURNOVER KIT B (KITS) ×4 IMPLANT
MANIFOLD NEPTUNE II (INSTRUMENTS) ×4 IMPLANT
NEEDLE HYPO 21X1.5 SAFETY (NEEDLE) IMPLANT
NEEDLE HYPO 25GX1X1/2 BEV (NEEDLE) ×4 IMPLANT
NS IRRIG 1000ML POUR BTL (IV SOLUTION) ×4 IMPLANT
PACK TOTAL JOINT (CUSTOM PROCEDURE TRAY) ×4 IMPLANT
PAD ARMBOARD 7.5X6 YLW CONV (MISCELLANEOUS) ×8 IMPLANT
PAD CAST 4YDX4 CTTN HI CHSV (CAST SUPPLIES) ×4 IMPLANT
PAD NEG PRESSURE SENSATRAC (MISCELLANEOUS) ×4 IMPLANT
PADDING CAST COTTON 4X4 STRL (CAST SUPPLIES) ×4
PADDING CAST COTTON 6X4 STRL (CAST SUPPLIES) ×8 IMPLANT
PLATE 10-HOLE (Plate) ×4 IMPLANT
PLATE VA-LCP F/2.7X202 12H (Plate) ×4 IMPLANT
PREVENA INCISION MGT 90 150 (MISCELLANEOUS) ×4 IMPLANT
SCREW 3.5X44MM (Screw) ×4 IMPLANT
SCREW CORTEX LOW PRO 3.5X28 (Screw) ×8 IMPLANT
SCREW CORTEX LOW PRO 3.5X30 (Screw) ×12 IMPLANT
SCREW CORTEX LOW PRO 3.5X32 (Screw) ×8 IMPLANT
SCREW CORTEX LP 3.5X34MM (Screw) ×4 IMPLANT
SCREW HEADED ST 3.5X65 (Screw) ×4 IMPLANT
SCREW HEADED ST 3.5X70 (Screw) ×4 IMPLANT
SCREW LOCK SELF-TAP 2.7X52MM (Screw) ×4 IMPLANT
SCREW LOCKING 2.7X44MM VA (Screw) ×8 IMPLANT
SCREW LOCKING VA 2.7X46 (Screw) ×28 IMPLANT
SCREW LOCKING VA 2.7X48 (Screw) ×4 IMPLANT
SCREW LOCKING VA 3.5X28MM (Screw) ×4 IMPLANT
SCREW METAPHYSEAL 2.7X42MM (Screw) ×4 IMPLANT
SPONGE LAP 18X18 RF (DISPOSABLE) IMPLANT
STAPLER VISISTAT 35W (STAPLE) ×4 IMPLANT
SUCTION FRAZIER HANDLE 10FR (MISCELLANEOUS) ×2
SUCTION TUBE FRAZIER 10FR DISP (MISCELLANEOUS) ×2 IMPLANT
SUT ETHILON 3 0 PS 1 (SUTURE) ×24 IMPLANT
SUT MNCRL AB 3-0 PS2 18 (SUTURE) ×4 IMPLANT
SUT MON AB 2-0 CT1 36 (SUTURE) ×8 IMPLANT
SUT PROLENE 0 CT (SUTURE) IMPLANT
SUT VIC AB 0 CT1 27 (SUTURE)
SUT VIC AB 0 CT1 27XBRD ANBCTR (SUTURE) IMPLANT
SUT VIC AB 2-0 CT1 27 (SUTURE)
SUT VIC AB 2-0 CT1 TAPERPNT 27 (SUTURE) IMPLANT
SYR CONTROL 10ML LL (SYRINGE) IMPLANT
TOWEL GREEN STERILE (TOWEL DISPOSABLE) ×8 IMPLANT
TOWEL GREEN STERILE FF (TOWEL DISPOSABLE) ×4 IMPLANT
UNDERPAD 30X30 (UNDERPADS AND DIAPERS) ×4 IMPLANT
WATER STERILE IRR 1000ML POUR (IV SOLUTION) ×4 IMPLANT
YANKAUER SUCT BULB TIP NO VENT (SUCTIONS) IMPLANT
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
## 2019-09-19 NOTE — Anesthesia Preprocedure Evaluation (Signed)
Anesthesia Evaluation  Patient identified by MRN, date of birth, ID band Patient unresponsive    Reviewed: Allergy & Precautions, NPO status , Patient's Chart, lab work & pertinent test results  Airway Mallampati: Intubated       Dental   Pulmonary  Intubated      + intubated    Cardiovascular negative cardio ROS Normal cardiovascular exam  ECG: ST, rate 121. Sinus tachycardia Probable left atrial enlargement   Neuro/Psych  C-spine not cleared    GI/Hepatic negative GI ROS, Neg liver ROS,   Endo/Other  Hypernatremia Hypokalemia   Renal/GU negative Renal ROS     Musculoskeletal Bilateral acetabular fractures with Bilateral SI diastasis  Bilateral femur fractures Bilateral ankle fractures   Abdominal   Peds  Hematology  (+) anemia , thrombocytopenia   Anesthesia Other Findings polytrauma  Reproductive/Obstetrics                             Anesthesia Physical  Anesthesia Plan  ASA: III  Anesthesia Plan: General   Post-op Pain Management:    Induction: Inhalational and Intravenous  PONV Risk Score and Plan: 2 and Ondansetron, Dexamethasone, Midazolam and Treatment may vary due to age or medical condition  Airway Management Planned: Oral ETT  Additional Equipment:   Intra-op Plan:   Post-operative Plan: Post-operative intubation/ventilation  Informed Consent: I have reviewed the patients History and Physical, chart, labs and discussed the procedure including the risks, benefits and alternatives for the proposed anesthesia with the patient or authorized representative who has indicated his/her understanding and acceptance.       Plan Discussed with: CRNA and Surgeon  Anesthesia Plan Comments: (Arterial line and central line in place)        Anesthesia Quick Evaluation

## 2019-09-19 NOTE — Anesthesia Procedure Notes (Signed)
Procedure Name: Intubation Date/Time: 09/25/2019 12:00 PM Performed by: Kathryne Hitch, CRNA Pre-anesthesia Checklist: Patient identified, Emergency Drugs available, Suction available and Patient being monitored Patient Re-evaluated:Patient Re-evaluated prior to induction Oxygen Delivery Method: Circle system utilized Preoxygenation: Pre-oxygenation with 100% oxygen Induction Type: Inhalational induction with existing ETT Placement Confirmation: positive ETCO2 Dental Injury: Teeth and Oropharynx as per pre-operative assessment

## 2019-09-19 NOTE — Transfer of Care (Signed)
Immediate Anesthesia Transfer of Care Note  Patient: [name]  Procedure(s) Performed: OPEN REDUCTION INTERNAL FIXATION (ORIF) TIBIA FRACTURE (Bilateral Leg Lower) Irrigation And Debridement ankle fracture (Bilateral Leg Lower) Removal External Fixation Leg (Bilateral Leg Lower)  Patient Location: ICU  Anesthesia Type:General  Level of Consciousness: sedated, patient cooperative and Patient remains intubated per anesthesia plan  Airway & Oxygen Therapy: Patient remains intubated per anesthesia plan and Patient placed on Ventilator (see vital sign flow sheet for setting)  Post-op Assessment: Report given to RN and Post -op Vital signs reviewed and stable  Post vital signs: Reviewed and stable  Last Vitals:  Vitals Value Taken Time  BP    Temp    Pulse 72 10/05/2019 1509  Resp 16 09/12/2019 1509  SpO2 96 % 09/12/2019 1509  Vitals shown include unvalidated device data.  Last Pain:  Vitals:   09/17/2019 0800  TempSrc: Axillary         Complications: No apparent anesthesia complications
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
## 2019-09-19 NOTE — Plan of Care (Signed)
  Problem: Nutritional: Goal: Dietary intake will improve Outcome: Progressing   

## 2019-09-19 NOTE — Progress Notes (Signed)
Daughter updated, all questions answered. Still high chance of needing trach, but will give him the next few days to declare himself. Restart home meds, continue diuresis, start low dose heparin gtt (h/o xarelto, unknown indication).  Jesusita Oka, MD General and [facility]

## 2019-09-19 NOTE — Progress Notes (Signed)
Wasted 30 mL versed in sink with Buck Mam RN
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
## 2019-09-19 NOTE — Anesthesia Postprocedure Evaluation (Signed)
Anesthesia Post Note  Patient: [name]  Procedure(s) Performed: OPEN REDUCTION INTERNAL FIXATION (ORIF) TIBIA FRACTURE (Bilateral Leg Lower) Irrigation And Debridement ankle fracture (Bilateral Leg Lower) Removal External Fixation Leg (Bilateral Leg Lower)     Patient location during evaluation: ICU Anesthesia Type: General Level of consciousness: sedated and patient remains intubated per anesthesia plan Pain management: pain level controlled Vital Signs Assessment: post-procedure vital signs reviewed and stable Respiratory status: patient remains intubated per anesthesia plan Cardiovascular status: stable Postop Assessment: no apparent nausea or vomiting Anesthetic complications: no    Last Vitals:  Vitals:   09/26/2019 1000 09/12/2019 1504  BP: 121/79 100/68  Pulse: 84 78  Resp: 20 20  Temp:    SpO2: 99% 90%    Last Pain:  Vitals:   09/12/2019 0800  TempSrc: Axillary                 [name]

## 2019-09-20 ENCOUNTER — Encounter (HOSPITAL_COMMUNITY): Payer: Self-pay | Admitting: Student

## 2019-09-20 ENCOUNTER — Inpatient Hospital Stay: Payer: Self-pay

## 2019-09-20 LAB — PHOSPHORUS: Phosphorus: <1 mg/dL — CL (ref 2.5–4.6)

## 2019-09-20 LAB — GLUCOSE, CAPILLARY
Glucose-Capillary: 135 mg/dL — ABNORMAL HIGH (ref 70–99)
Glucose-Capillary: 140 mg/dL — ABNORMAL HIGH (ref 70–99)
Glucose-Capillary: 137 mg/dL — ABNORMAL HIGH (ref 70–99)
Glucose-Capillary: 149 mg/dL — ABNORMAL HIGH (ref 70–99)
Glucose-Capillary: 145 mg/dL — ABNORMAL HIGH (ref 70–99)
Glucose-Capillary: 148 mg/dL — ABNORMAL HIGH (ref 70–99)

## 2019-09-20 LAB — CBC
HCT: 26.2 % — ABNORMAL LOW (ref 39.0–52.0)
Hemoglobin: 8.8 g/dL — ABNORMAL LOW (ref 13.0–17.0)
MCH: 29.3 pg (ref 26.0–34.0)
MCHC: 33.6 g/dL (ref 30.0–36.0)
MCV: 87.3 fL (ref 80.0–100.0)
Platelets: 81 10*3/uL — ABNORMAL LOW (ref 150–400)
RBC: 3 MIL/uL — ABNORMAL LOW (ref 4.22–5.81)
RDW: 15.9 % — ABNORMAL HIGH (ref 11.5–15.5)
WBC: 7.4 10*3/uL (ref 4.0–10.5)
nRBC: 6.6 % — ABNORMAL HIGH (ref 0.0–0.2)

## 2019-09-20 LAB — HEPARIN LEVEL (UNFRACTIONATED)
Heparin Unfractionated: <0.1 IU/mL — ABNORMAL LOW (ref 0.30–0.70)
Heparin Unfractionated: 0.1 [IU]/mL — ABNORMAL LOW (ref 0.30–0.70)
Heparin Unfractionated: 0.17 [IU]/mL — ABNORMAL LOW (ref 0.30–0.70)

## 2019-09-20 LAB — BASIC METABOLIC PANEL
Anion gap: 9 (ref 5–15)
BUN: 21 mg/dL — ABNORMAL HIGH (ref 6–20)
CO2: 26 mmol/L (ref 22–32)
Calcium: 7.6 mg/dL — ABNORMAL LOW (ref 8.9–10.3)
Chloride: 117 mmol/L — ABNORMAL HIGH (ref 98–111)
Creatinine, Ser: 0.82 mg/dL (ref 0.61–1.24)
GFR calc Af Amer: >60 mL/min (ref >60)
GFR calc non Af Amer: >60 mL/min (ref >60)
Glucose, Bld: 145 mg/dL — ABNORMAL HIGH (ref 70–99)
Potassium: 3.1 mmol/L — ABNORMAL LOW (ref 3.5–5.1)
Sodium: 152 mmol/L — ABNORMAL HIGH (ref 135–145)

## 2019-09-20 LAB — MAGNESIUM: Magnesium: 2 mg/dL (ref 1.7–2.4)

## 2019-09-20 MED ORDER — HYDROCORTISONE NA SUCCINATE PF 100 MG IJ SOLR
50.0000 mg | Freq: Two times a day (BID) | INTRAMUSCULAR | Status: DC
  Administered 2019-09-20 – 2019-09-21 (×2): 50 mg via INTRAVENOUS
  Filled 2019-09-20 (×3): qty 2

## 2019-09-20 MED ORDER — POTASSIUM PHOSPHATES 15 MMOLE/5ML IV SOLN
30.0000 mmol | Freq: Once | INTRAVENOUS | Status: AC
  Administered 2019-09-20: 30 mmol via INTRAVENOUS
  Filled 2019-09-20: qty 10

## 2019-09-20 MED ORDER — PIVOT 1.5 CAL PO LIQD
1000.0000 mL | ORAL | Status: DC
  Administered 2019-09-20 – 2019-09-24 (×6): 1000 mL
  Filled 2019-09-20: qty 1000

## 2019-09-20 NOTE — Progress Notes (Signed)
[place] for heparin Indication: hx of PE  Allergies  Allergen Reactions  . Acyclovir And Related Other (See Comments)    unknown    Patient Measurements: Height: 5\' 10"  (761.6 cm) Weight: 229 lb 15 oz (104.3 kg) IBW/kg (Calculated) : 73 Heparin Dosing Weight: 90 kg  Vital Signs: Temp: 99.2 F (37.3 C) (11/10 1600) Temp Source: Axillary (11/10 1600) BP: 105/65 (11/10 1300) Pulse Rate: 89 (11/10 1300)  Labs: Recent Labs    09/18/19 0559 2019/09/26 0500 2019-09-26 0536 September 26, 2019 2323 09/20/19 0538 09/20/19 0838 09/20/19 1708  HGB 9.5* 9.2* 8.8*  --  8.8*  --   --   HCT 27.7* 27.5* 26.0*  --  26.2*  --   --   PLT 69* 81*  --   --  81*  --   --   HEPARINUNFRC  --   --   --  <0.10*  --  0.10* 0.17*  CREATININE 0.80 0.68  --   --  0.82  --   --     Estimated Creatinine Clearance: 118.8 mL/min (by C-G formula based on SCr of 0.82 mg/dL).   Medical History: History reviewed. No pertinent past medical history.  Assessment: 50 yoM admitted as trauma. Hx of xarelto d/t PE  Heparin level remains subtherapeutic s/p rate increase to 1450 units/hr, RN confirmed rate and no infusion issues.   Goal of Therapy:  Heparin level 0.3-0.5 units/ml Monitor platelets by anticoagulation protocol: Yes   Plan:  Increase heparin gtt to 1650 units/hr, no bolus F/u 6 hour heparin level  Bertis Ruddy, PharmD Clinical Pharmacist Please check AMION for all [place] numbers 09/20/2019 6:19 PM
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
## 2019-09-20 NOTE — Progress Notes (Signed)
Orthopedic Tech Progress Note Patient Details:  [name] 18-Sep-1961 [id number]  Ortho Devices Type of Ortho Device: Prafo boot/shoe Ortho Device/Splint Interventions: Application       [name] 09/20/2019, 10:39 AM
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
## 2019-09-21 LAB — BASIC METABOLIC PANEL
Anion gap: 7 (ref 5–15)
BUN: 24 mg/dL — ABNORMAL HIGH (ref 6–20)
CO2: 24 mmol/L (ref 22–32)
Calcium: 7.2 mg/dL — ABNORMAL LOW (ref 8.9–10.3)
Chloride: 121 mmol/L — ABNORMAL HIGH (ref 98–111)
Creatinine, Ser: 0.76 mg/dL (ref 0.61–1.24)
GFR calc Af Amer: >60 mL/min (ref >60)
GFR calc non Af Amer: >60 mL/min (ref >60)
Glucose, Bld: 161 mg/dL — ABNORMAL HIGH (ref 70–99)
Potassium: 3.1 mmol/L — ABNORMAL LOW (ref 3.5–5.1)
Sodium: 152 mmol/L — ABNORMAL HIGH (ref 135–145)

## 2019-09-21 LAB — CBC
HCT: 25.6 % — ABNORMAL LOW (ref 39.0–52.0)
Hemoglobin: 8.6 g/dL — ABNORMAL LOW (ref 13.0–17.0)
MCH: 29.3 pg (ref 26.0–34.0)
MCHC: 33.6 g/dL (ref 30.0–36.0)
MCV: 87.1 fL (ref 80.0–100.0)
Platelets: 84 10*3/uL — ABNORMAL LOW (ref 150–400)
RBC: 2.94 MIL/uL — ABNORMAL LOW (ref 4.22–5.81)
RDW: 16 % — ABNORMAL HIGH (ref 11.5–15.5)
WBC: 10.3 10*3/uL (ref 4.0–10.5)
nRBC: 2.8 % — ABNORMAL HIGH (ref 0.0–0.2)

## 2019-09-21 LAB — GLUCOSE, CAPILLARY
Glucose-Capillary: 136 mg/dL — ABNORMAL HIGH (ref 70–99)
Glucose-Capillary: 151 mg/dL — ABNORMAL HIGH (ref 70–99)
Glucose-Capillary: 141 mg/dL — ABNORMAL HIGH (ref 70–99)
Glucose-Capillary: 134 mg/dL — ABNORMAL HIGH (ref 70–99)
Glucose-Capillary: 139 mg/dL — ABNORMAL HIGH (ref 70–99)
Glucose-Capillary: 139 mg/dL — ABNORMAL HIGH (ref 70–99)

## 2019-09-21 LAB — HEPARIN LEVEL (UNFRACTIONATED)
Heparin Unfractionated: 0.23 [IU]/mL — ABNORMAL LOW (ref 0.30–0.70)
Heparin Unfractionated: 0.35 [IU]/mL (ref 0.30–0.70)

## 2019-09-21 LAB — PHOSPHORUS: Phosphorus: 1.5 mg/dL — ABNORMAL LOW (ref 2.5–4.6)

## 2019-09-21 LAB — MAGNESIUM: Magnesium: 2.3 mg/dL (ref 1.7–2.4)

## 2019-09-21 MED ORDER — TRIAMTERENE-HCTZ 37.5-25 MG PO TABS
0.5000 | ORAL_TABLET | Freq: Every day | ORAL | Status: DC
  Administered 2019-09-21 – 2019-09-24 (×3): 0.5 via ORAL
  Filled 2019-09-21 (×5): qty 0.5

## 2019-09-21 MED ORDER — SODIUM CHLORIDE 0.9% FLUSH: 10.0000 mL | INTRAVENOUS | Status: DC | PRN

## 2019-09-21 MED ORDER — VERAPAMIL HCL 80 MG PO TABS
80.0000 mg | ORAL_TABLET | Freq: Two times a day (BID) | ORAL | Status: DC
  Administered 2019-09-21 – 2019-09-24 (×4): 80 mg via ORAL
  Filled 2019-09-21 (×8): qty 1

## 2019-09-21 MED ORDER — POTASSIUM PHOSPHATES 15 MMOLE/5ML IV SOLN
30.0000 mmol | Freq: Once | INTRAVENOUS | Status: AC
  Administered 2019-09-21: 30 mmol via INTRAVENOUS
  Filled 2019-09-21: qty 10

## 2019-09-21 MED ORDER — FUROSEMIDE 10 MG/ML IJ SOLN
40.0000 mg | Freq: Once | INTRAMUSCULAR | Status: AC
  Administered 2019-09-21: 40 mg via INTRAVENOUS
  Filled 2019-09-21: qty 4

## 2019-09-21 MED ORDER — HYDROCORTISONE NA SUCCINATE PF 100 MG IJ SOLR
25.0000 mg | Freq: Two times a day (BID) | INTRAMUSCULAR | Status: DC
  Administered 2019-09-21 – 2019-09-24 (×6): 25 mg via INTRAVENOUS
  Filled 2019-09-21 (×6): qty 2

## 2019-09-21 MED ORDER — METOLAZONE 5 MG PO TABS
5.0000 mg | ORAL_TABLET | Freq: Once | ORAL | Status: AC
  Administered 2019-09-21: 5 mg
  Filled 2019-09-21: qty 1

## 2019-09-21 MED ORDER — SODIUM CHLORIDE 0.9% FLUSH
10.0000 mL | Freq: Two times a day (BID) | INTRAVENOUS | Status: DC
  Administered 2019-09-21 – 2019-09-22 (×3): 10 mL
  Administered 2019-09-22: 20 mL
  Administered 2019-09-23 – 2019-09-27 (×8): 10 mL
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
## 2019-09-21 NOTE — Progress Notes (Signed)
Peripherally Inserted Central Catheter/Midline Placement  The IV Nurse has discussed with the patient and/or persons authorized to consent for the patient, the purpose of this procedure and the potential benefits and risks involved with this procedure.  The benefits include less needle sticks, lab draws from the catheter, and the patient may be discharged home with the catheter. Risks include, but not limited to, infection, bleeding, blood clot (thrombus formation), and puncture of an artery; nerve damage and irregular heartbeat and possibility to perform a PICC exchange if needed/ordered by physician.  Alternatives to this procedure were also discussed.  Bard Power PICC patient education guide, fact sheet on infection prevention and patient information card has been provided to patient /or left at bedside.    PICC/Midline Placement Documentation  PICC Triple Lumen 80/32/12 Right Basilic 38 cm (Active)  Indication for Insertion or Continuance of Line Prolonged intravenous therapies 09/21/19 1254  Exposed Catheter (cm) 0 cm 09/21/19 1254  Site Assessment Clean;Dry;Intact 09/21/19 1254  Lumen #1 Status Blood return noted;Flushed;Saline locked 09/21/19 1254  Lumen #2 Status Flushed;Blood return noted;Saline locked 09/21/19 1254  Lumen #3 Status Flushed;Blood return noted;Saline locked 09/21/19 1254  Dressing Type Transparent;Securing device 09/21/19 1254  Dressing Status Clean;Dry;Intact;Antimicrobial disc in place 09/21/19 1254       Frances Maywood 09/21/2019, 12:59 PM

## 2019-09-21 NOTE — Plan of Care (Signed)
  Problem: Nutrition: Goal: Adequate nutrition will be maintained Outcome: Progressing   Problem: Elimination: Goal: Will not experience complications related to bowel motility Outcome: Progressing Pt had BM today   Problem: Activity: Goal: Risk for activity intolerance will decrease Outcome: Not Progressing   Problem: Skin Integrity: Goal: Risk for impaired skin integrity will decrease Outcome: Not Progressing Pt continues to have serous popping blisters that weep, supportive care and monitoring continues
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
## 2019-09-22 LAB — CBC
HCT: 26.6 % — ABNORMAL LOW (ref 39.0–52.0)
Hemoglobin: 8.8 g/dL — ABNORMAL LOW (ref 13.0–17.0)
MCH: 29.7 pg (ref 26.0–34.0)
MCHC: 33.1 g/dL (ref 30.0–36.0)
MCV: 89.9 fL (ref 80.0–100.0)
Platelets: 66 10*3/uL — ABNORMAL LOW (ref 150–400)
RBC: 2.96 MIL/uL — ABNORMAL LOW (ref 4.22–5.81)
RDW: 16.5 % — ABNORMAL HIGH (ref 11.5–15.5)
WBC: 8.9 10*3/uL (ref 4.0–10.5)
nRBC: 2.8 % — ABNORMAL HIGH (ref 0.0–0.2)

## 2019-09-22 LAB — BASIC METABOLIC PANEL
Anion gap: 9 (ref 5–15)
BUN: 29 mg/dL — ABNORMAL HIGH (ref 6–20)
CO2: 27 mmol/L (ref 22–32)
Calcium: 7 mg/dL — ABNORMAL LOW (ref 8.9–10.3)
Chloride: 115 mmol/L — ABNORMAL HIGH (ref 98–111)
Creatinine, Ser: 0.88 mg/dL (ref 0.61–1.24)
GFR calc Af Amer: >60 mL/min (ref >60)
GFR calc non Af Amer: >60 mL/min (ref >60)
Glucose, Bld: 147 mg/dL — ABNORMAL HIGH (ref 70–99)
Potassium: 3.1 mmol/L — ABNORMAL LOW (ref 3.5–5.1)
Sodium: 151 mmol/L — ABNORMAL HIGH (ref 135–145)

## 2019-09-22 LAB — GLUCOSE, CAPILLARY
Glucose-Capillary: 151 mg/dL — ABNORMAL HIGH (ref 70–99)
Glucose-Capillary: 181 mg/dL — ABNORMAL HIGH (ref 70–99)
Glucose-Capillary: 153 mg/dL — ABNORMAL HIGH (ref 70–99)
Glucose-Capillary: 133 mg/dL — ABNORMAL HIGH (ref 70–99)
Glucose-Capillary: 196 mg/dL — ABNORMAL HIGH (ref 70–99)
Glucose-Capillary: 154 mg/dL — ABNORMAL HIGH (ref 70–99)

## 2019-09-22 LAB — MAGNESIUM: Magnesium: 2.5 mg/dL — ABNORMAL HIGH (ref 1.7–2.4)

## 2019-09-22 LAB — PHOSPHORUS: Phosphorus: 1.7 mg/dL — ABNORMAL LOW (ref 2.5–4.6)

## 2019-09-22 MED ORDER — FREE WATER: 200.0000 mL | Freq: Three times a day (TID) | Status: DC

## 2019-09-22 MED ORDER — FUROSEMIDE 10 MG/ML IJ SOLN
40.0000 mg | Freq: Once | INTRAMUSCULAR | Status: AC
  Administered 2019-09-22: 40 mg via INTRAVENOUS
  Filled 2019-09-22: qty 4

## 2019-09-22 MED ORDER — PRO-STAT SUGAR FREE PO LIQD
30.0000 mL | Freq: Every day | ORAL | Status: DC
  Administered 2019-09-22 – 2019-09-24 (×3): 30 mL
  Filled 2019-09-22 (×4): qty 30

## 2019-09-22 MED ORDER — POTASSIUM CHLORIDE 10 MEQ/50ML IV SOLN
10.0000 meq | INTRAVENOUS | Status: AC
  Administered 2019-09-22 (×4): 10 meq via INTRAVENOUS
  Filled 2019-09-22 (×4): qty 50

## 2019-09-22 NOTE — Progress Notes (Signed)
Palliative-  Consult received, chart reviewed. Called and left message for patient's daughterEllison Perez to schedule [place] meeting.  Mariana Kaufman, AGNP-C Palliative Medicine  Please call Palliative Medicine team phone with any questions [phone]. For individual providers please see AMION.  No charge
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
## 2019-09-22 NOTE — Progress Notes (Signed)
Palliative-   Spoke with patient's daughterEllison Hughs- plan made to meet tomorrow at 4pm for [place].   Mariana Kaufman, AGNP-C Palliative Medicine  Please call Palliative Medicine team phone with any questions [phone]. For individual providers please see AMION.
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
## 2019-09-23 ENCOUNTER — Inpatient Hospital Stay (HOSPITAL_COMMUNITY): Payer: 59

## 2019-09-23 DIAGNOSIS — J9601 Acute respiratory failure with hypoxia: Secondary | ICD-10-CM

## 2019-09-23 DIAGNOSIS — G35 Multiple sclerosis: Secondary | ICD-10-CM

## 2019-09-23 DIAGNOSIS — Z7189 Other specified counseling: Secondary | ICD-10-CM

## 2019-09-23 DIAGNOSIS — T148XXA Other injury of unspecified body region, initial encounter: Secondary | ICD-10-CM

## 2019-09-23 DIAGNOSIS — Z978 Presence of other specified devices: Secondary | ICD-10-CM

## 2019-09-23 DIAGNOSIS — J9602 Acute respiratory failure with hypercapnia: Secondary | ICD-10-CM

## 2019-09-23 DIAGNOSIS — T1490XA Injury, unspecified, initial encounter: Secondary | ICD-10-CM

## 2019-09-23 DIAGNOSIS — S8252XA Displaced fracture of medial malleolus of left tibia, initial encounter for closed fracture: Secondary | ICD-10-CM

## 2019-09-23 DIAGNOSIS — Z515 Encounter for palliative care: Secondary | ICD-10-CM

## 2019-09-23 LAB — HEPARIN LEVEL (UNFRACTIONATED)
Heparin Unfractionated: 0.96 [IU]/mL — ABNORMAL HIGH (ref 0.30–0.70)
Heparin Unfractionated: 1.04 [IU]/mL — ABNORMAL HIGH (ref 0.30–0.70)
Heparin Unfractionated: <0.1 [IU]/mL — ABNORMAL LOW (ref 0.30–0.70)

## 2019-09-23 LAB — GLUCOSE, CAPILLARY
Glucose-Capillary: 148 mg/dL — ABNORMAL HIGH (ref 70–99)
Glucose-Capillary: 174 mg/dL — ABNORMAL HIGH (ref 70–99)
Glucose-Capillary: 181 mg/dL — ABNORMAL HIGH (ref 70–99)
Glucose-Capillary: 173 mg/dL — ABNORMAL HIGH (ref 70–99)
Glucose-Capillary: 179 mg/dL — ABNORMAL HIGH (ref 70–99)
Glucose-Capillary: 205 mg/dL — ABNORMAL HIGH (ref 70–99)

## 2019-09-23 LAB — BASIC METABOLIC PANEL
Anion gap: 12 (ref 5–15)
BUN: 38 mg/dL — ABNORMAL HIGH (ref 6–20)
CO2: 27 mmol/L (ref 22–32)
Calcium: 7.4 mg/dL — ABNORMAL LOW (ref 8.9–10.3)
Chloride: 113 mmol/L — ABNORMAL HIGH (ref 98–111)
Creatinine, Ser: 0.9 mg/dL (ref 0.61–1.24)
GFR calc Af Amer: >60 mL/min (ref >60)
GFR calc non Af Amer: >60 mL/min (ref >60)
Glucose, Bld: 160 mg/dL — ABNORMAL HIGH (ref 70–99)
Potassium: 3.6 mmol/L (ref 3.5–5.1)
Sodium: 152 mmol/L — ABNORMAL HIGH (ref 135–145)

## 2019-09-23 LAB — CBC
HCT: 28.7 % — ABNORMAL LOW (ref 39.0–52.0)
Hemoglobin: 9.3 g/dL — ABNORMAL LOW (ref 13.0–17.0)
MCH: 29.2 pg (ref 26.0–34.0)
MCHC: 32.4 g/dL (ref 30.0–36.0)
MCV: 90.3 fL (ref 80.0–100.0)
Platelets: 81 10*3/uL — ABNORMAL LOW (ref 150–400)
RBC: 3.18 MIL/uL — ABNORMAL LOW (ref 4.22–5.81)
RDW: 16.6 % — ABNORMAL HIGH (ref 11.5–15.5)
WBC: 8.5 10*3/uL (ref 4.0–10.5)
nRBC: 3.5 % — ABNORMAL HIGH (ref 0.0–0.2)

## 2019-09-23 LAB — MAGNESIUM: Magnesium: 2.8 mg/dL — ABNORMAL HIGH (ref 1.7–2.4)

## 2019-09-23 MED ORDER — FENTANYL BOLUS VIA INFUSION
200.0000 ug | INTRAVENOUS | Status: DC | PRN
  Administered 2019-09-23 – 2019-09-24 (×2): 200 ug via INTRAVENOUS
  Filled 2019-09-23: qty 200

## 2019-09-23 MED ORDER — FUROSEMIDE 10 MG/ML IJ SOLN
40.0000 mg | Freq: Once | INTRAMUSCULAR | Status: AC
  Administered 2019-09-23: 40 mg via INTRAVENOUS
  Filled 2019-09-23: qty 4

## 2019-09-23 MED ORDER — LORAZEPAM 2 MG/ML IJ SOLN: 2.0000 mg | INTRAMUSCULAR | Status: DC | PRN

## 2019-09-23 MED ORDER — FREE WATER
300.0000 mL | Freq: Three times a day (TID) | Status: DC
  Administered 2019-09-23 – 2019-09-25 (×6): 300 mL

## 2019-09-23 MED ORDER — PROPOFOL 1000 MG/100ML IV EMUL
INTRAVENOUS | Status: AC
  Filled 2019-09-23: qty 100
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
## 2019-09-23 NOTE — Progress Notes (Signed)
ANTICOAGULATION CONSULT NOTE - Follow Up Consult  Pharmacy Consult for heparin Indication: h/o PE  Labs: Recent Labs    09/21/19 0505  09/22/19 0423 09/23/19 0435 09/23/19 0745 09/23/19 1311 09/23/19 2033  HGB 8.6*  --  8.8* 9.3*  --   --   --   HCT 25.6*  --  26.6* 28.7*  --   --   --   PLT 84*  --  66* 81*  --   --   --   HEPARINUNFRC  --    < >  --   --  0.96* 1.04* <0.10*  CREATININE 0.76  --  0.88 0.90  --   --   --    < > = values in this interval not displayed.    Assessment: 58yo male subtherapeutic on heparin after rate change for high level; no gtt issues or signs of bleeding per RN.  Goal of Therapy:  Heparin level 0.3-0.5 units/ml   Plan:  Will increase heparin gtt by 2 units/kg/hr to 1600 units/hr and check level in 6 hours.    Wynona Neat, PharmD, BCPS  09/23/2019,11:07 PM
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
## 2019-09-23 NOTE — Progress Notes (Signed)
Palliative consult brief note- full note to follow-   Met with patient's daughter and brother and friend. Plan made for extubation to full comfort on Monday at 2pm. In the interim they wish for family members to visit to say goodbye. Agree to DNR. Request aggressive symptom control.   Additional symptom management recommendations:   -226mg fentanyl bolus q1hr prn for any signs of agitation or discomfort -Lorazepam '2mg'$  q4hr prn for any signs of agitation or discomfort   KMariana Kaufman AGNP-C Palliative Medicine  Please call Palliative Medicine team phone with any questions [phone] For individual providers please see AMION.  No charge
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
## 2019-09-23 NOTE — Progress Notes (Signed)
Updated Trauma MD Donne Hazel about patient's CBG of 205. No new orders received. Will continue to monitor.

## 2019-09-23 NOTE — Progress Notes (Signed)
ANTICOAGULATION CONSULT NOTE - Initial Consult  Pharmacy Consult for heparin Indication: hx of PE  Allergies  Allergen Reactions  . Acyclovir And Related Other (See Comments)    unknown    Patient Measurements: Height: 5\' 10"  (177.8 cm) Weight: 211 lb 13.8 oz (96.1 kg) IBW/kg (Calculated) : 73 Heparin Dosing Weight: 90 kg  Vital Signs: Temp: 99.6 F (37.6 C) (11/13 1200) Temp Source: Axillary (11/13 1200) BP: 124/74 (11/13 1457) Pulse Rate: 108 (11/13 1400)  Labs: Recent Labs    09/21/19 0505 09/21/19 1308 09/22/19 0423 09/23/19 0435 09/23/19 0745 09/23/19 1311  HGB 8.6*  --  8.8* 9.3*  --   --   HCT 25.6*  --  26.6* 28.7*  --   --   PLT 84*  --  66* 81*  --   --   HEPARINUNFRC  --  0.35  --   --  0.96* 1.04*  CREATININE 0.76  --  0.88 0.90  --   --     Estimated Creatinine Clearance: 104 mL/min (by C-G formula based on SCr of 0.9 mg/dL).   Medical History: History reviewed. No pertinent past medical history.  Assessment: 82 yoM admitted as trauma. Hx of xarelto d/t PE  Heparin level supratherapeutic, RN confirmed lab draw and rate, no bleeding reported at this time.  Goal of Therapy:  Heparin level 0.3-0.5 units/ml Monitor platelets by anticoagulation protocol: Yes   Plan:  Hold heparin for 1h, then restart at reduced rate of 1400 units/hr F/u 6 hour heparin level  Bertis Ruddy, PharmD Clinical Pharmacist Please check AMION for all [place] numbers 09/23/2019 3:01 PM

## 2019-09-23 NOTE — Progress Notes (Signed)
RN and RT transported pt from room 4N26 to CT and back without complication. Pt respiratory status remained stable throughout transport on vent. RT will continue to monitor.

## 2019-09-23 NOTE — Progress Notes (Signed)
ANTICOAGULATION CONSULT NOTE - Initial Consult  Pharmacy Consult for heparin Indication: hx of PE  Allergies  Allergen Reactions  . Acyclovir And Related Other (See Comments)    unknown    Patient Measurements: Height: 5\' 10"  (177.8 cm) Weight: 211 lb 13.8 oz (96.1 kg) IBW/kg (Calculated) : 73 Heparin Dosing Weight: 90 kg  Vital Signs: Temp: 99.9 F (37.7 C) (11/13 0744) Temp Source: Axillary (11/13 0744) BP: 137/85 (11/13 0800) Pulse Rate: 109 (11/13 0800)  Labs: Recent Labs    09/20/19 2349  09/21/19 0505 09/21/19 1308 09/22/19 0423 09/23/19 0435 09/23/19 0745  HGB  --    < > 8.6*  --  8.8* 9.3*  --   HCT  --   --  25.6*  --  26.6* 28.7*  --   PLT  --   --  84*  --  66* 81*  --   HEPARINUNFRC 0.23*  --   --  0.35  --   --  0.96*  CREATININE  --   --  0.76  --  0.88 0.90  --    < > = values in this interval not displayed.    Estimated Creatinine Clearance: 104 mL/min (by C-G formula based on SCr of 0.9 mg/dL).   Medical History: History reviewed. No pertinent past medical history.  Assessment: 17 yoM admitted as trauma. Hx of xarelto d/t PE  Heparin level supratherapeutic, no bleeding noted, H/H improved, plts 81  Goal of Therapy:  Heparin level 0.3-0.5 units/ml Monitor platelets by anticoagulation protocol: Yes   Plan:  Decrease heparin gtt to 1650 units/hr F/u 6 hour heparin level  Bertis Ruddy, PharmD Clinical Pharmacist Please check AMION for all [place] numbers 09/23/2019 8:53 AM

## 2019-09-24 DIAGNOSIS — D62 Acute posthemorrhagic anemia: Secondary | ICD-10-CM

## 2019-09-24 LAB — GLUCOSE, CAPILLARY
Glucose-Capillary: 183 mg/dL — ABNORMAL HIGH (ref 70–99)
Glucose-Capillary: 208 mg/dL — ABNORMAL HIGH (ref 70–99)
Glucose-Capillary: 185 mg/dL — ABNORMAL HIGH (ref 70–99)
Glucose-Capillary: 166 mg/dL — ABNORMAL HIGH (ref 70–99)
Glucose-Capillary: 198 mg/dL — ABNORMAL HIGH (ref 70–99)
Glucose-Capillary: 207 mg/dL — ABNORMAL HIGH (ref 70–99)

## 2019-09-24 LAB — CBC
HCT: 30 % — ABNORMAL LOW (ref 39.0–52.0)
Hemoglobin: 9.6 g/dL — ABNORMAL LOW (ref 13.0–17.0)
MCH: 29.3 pg (ref 26.0–34.0)
MCHC: 32 g/dL (ref 30.0–36.0)
MCV: 91.5 fL (ref 80.0–100.0)
Platelets: 128 10*3/uL — ABNORMAL LOW (ref 150–400)
RBC: 3.28 MIL/uL — ABNORMAL LOW (ref 4.22–5.81)
RDW: 16.8 % — ABNORMAL HIGH (ref 11.5–15.5)
WBC: 11.2 10*3/uL — ABNORMAL HIGH (ref 4.0–10.5)
nRBC: 2.6 % — ABNORMAL HIGH (ref 0.0–0.2)

## 2019-09-24 LAB — BASIC METABOLIC PANEL
Anion gap: 11 (ref 5–15)
BUN: 44 mg/dL — ABNORMAL HIGH (ref 6–20)
CO2: 24 mmol/L (ref 22–32)
Calcium: 7.2 mg/dL — ABNORMAL LOW (ref 8.9–10.3)
Chloride: 115 mmol/L — ABNORMAL HIGH (ref 98–111)
Creatinine, Ser: 1 mg/dL (ref 0.61–1.24)
GFR calc Af Amer: >60 mL/min (ref >60)
GFR calc non Af Amer: >60 mL/min (ref >60)
Glucose, Bld: 177 mg/dL — ABNORMAL HIGH (ref 70–99)
Potassium: 4 mmol/L (ref 3.5–5.1)
Sodium: 150 mmol/L — ABNORMAL HIGH (ref 135–145)

## 2019-09-24 LAB — HEPARIN LEVEL (UNFRACTIONATED)
Heparin Unfractionated: 0.17 [IU]/mL — ABNORMAL LOW (ref 0.30–0.70)
Heparin Unfractionated: 1.03 [IU]/mL — ABNORMAL HIGH (ref 0.30–0.70)
Heparin Unfractionated: 0.28 [IU]/mL — ABNORMAL LOW (ref 0.30–0.70)
Heparin Unfractionated: 0.21 [IU]/mL — ABNORMAL LOW (ref 0.30–0.70)

## 2019-09-24 LAB — POCT I-STAT 7, (LYTES, BLD GAS, ICA,H+H)
Acid-Base Excess: 2 mmol/L (ref 0.0–2.0)
Bicarbonate: 26.3 mmol/L (ref 20.0–28.0)
Calcium, Ion: 1.07 mmol/L — ABNORMAL LOW (ref 1.15–1.40)
HCT: 26 % — ABNORMAL LOW (ref 39.0–52.0)
Hemoglobin: 8.8 g/dL — ABNORMAL LOW (ref 13.0–17.0)
O2 Saturation: 100 %
Patient temperature: 99.5
Potassium: 4.1 mmol/L (ref 3.5–5.1)
Sodium: 151 mmol/L — ABNORMAL HIGH (ref 135–145)
TCO2: 27 mmol/L (ref 22–32)
pCO2 arterial: 38 mmHg (ref 32.0–48.0)
pH, Arterial: 7.45 (ref 7.350–7.450)
pO2, Arterial: 160 mmHg — ABNORMAL HIGH (ref 83.0–108.0)

## 2019-09-24 MED ORDER — ACETAMINOPHEN 160 MG/5ML PO SOLN
650.0000 mg | Freq: Four times a day (QID) | ORAL | Status: DC | PRN
  Administered 2019-09-24 – 2019-09-25 (×2): 650 mg
  Filled 2019-09-24 (×2): qty 20.3

## 2019-09-24 MED ORDER — HYDROCORTISONE NA SUCCINATE PF 100 MG IJ SOLR
25.0000 mg | Freq: Every day | INTRAMUSCULAR | Status: DC
  Filled 2019-09-24: qty 2

## 2019-09-24 NOTE — Progress Notes (Signed)
ANTICOAGULATION CONSULT NOTE - Initial Consult  Pharmacy Consult for heparin Indication: hx of PE  Allergies  Allergen Reactions  . Acyclovir And Related Other (See Comments)    unknown    Patient Measurements: Height: 5\' 10"  (177.8 cm) Weight: 195 lb 8.8 oz (88.7 kg)(took patient's weight twice to confirm) IBW/kg (Calculated) : 73 Heparin Dosing Weight: 90 kg  Vital Signs: Temp: 99.5 F (37.5 C) (11/14 0400) Temp Source: Oral (11/14 0400) BP: 117/66 (11/14 0500) Pulse Rate: 114 (11/14 0500)  Labs: Recent Labs    09/22/19 0423 09/23/19 0435  09/23/19 1311 09/23/19 2033 09/24/19 0455 09/24/19 0502  HGB 8.8* 9.3*  --   --   --  8.8*  --   HCT 26.6* 28.7*  --   --   --  26.0*  --   PLT 66* 81*  --   --   --   --   --   HEPARINUNFRC  --   --    < > 1.04* <0.10*  --  0.17*  CREATININE 0.88 0.90  --   --   --   --   --    < > = values in this interval not displayed.    Estimated Creatinine Clearance: 100.3 mL/min (by C-G formula based on SCr of 0.9 mg/dL).   Medical History: History reviewed. No pertinent past medical history.  Assessment: 59 yoM admitted as trauma. Hx of xarelto d/t PE  Heparin level subtherapeutic at 0.17, no bleeding reported at this time.  Goal of Therapy:  Heparin level 0.3-0.5 units/ml Monitor platelets by anticoagulation protocol: Yes   Plan:  Increase heparin to 1700 units/hr F/u 6 hour heparin level  Alanda Slim, PharmD, [facility] Clinical Pharmacist Please see AMION for all Pharmacists' Contact Phone Numbers 09/24/2019, 6:16 AM
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
## 2019-09-24 NOTE — Progress Notes (Signed)
Brother, Lennette Bihari at bedside. Lennette Bihari states that patient's mother does not want to withdrawal care on Mr. Custis. I informed him that unless there is legal paperwork stating mother as POA then the next of kin is still the daughter. Lennette Bihari states he will look for paperwork and get back to me. The family is not in agreeance on plan of care. Will continue to give full support to patient at this time.

## 2019-09-25 DIAGNOSIS — S82832B Other fracture of upper and lower end of left fibula, initial encounter for open fracture type I or II: Secondary | ICD-10-CM

## 2019-09-25 DIAGNOSIS — Z7189 Other specified counseling: Secondary | ICD-10-CM

## 2019-09-25 DIAGNOSIS — G35 Multiple sclerosis: Secondary | ICD-10-CM

## 2019-09-25 DIAGNOSIS — S82302B Unspecified fracture of lower end of left tibia, initial encounter for open fracture type I or II: Secondary | ICD-10-CM

## 2019-09-25 DIAGNOSIS — S329XXA Fracture of unspecified parts of lumbosacral spine and pelvis, initial encounter for closed fracture: Secondary | ICD-10-CM

## 2019-09-25 DIAGNOSIS — Z515 Encounter for palliative care: Secondary | ICD-10-CM

## 2019-09-25 LAB — GLUCOSE, CAPILLARY
Glucose-Capillary: 208 mg/dL — ABNORMAL HIGH (ref 70–99)
Glucose-Capillary: 232 mg/dL — ABNORMAL HIGH (ref 70–99)

## 2019-09-25 LAB — HEPARIN LEVEL (UNFRACTIONATED): Heparin Unfractionated: 0.29 [IU]/mL — ABNORMAL LOW (ref 0.30–0.70)

## 2019-09-25 MED ORDER — FENTANYL BOLUS VIA INFUSION
20.0000 ug | INTRAVENOUS | Status: DC | PRN
  Filled 2019-09-25: qty 20

## 2019-09-25 MED ORDER — HALOPERIDOL LACTATE 5 MG/ML IJ SOLN: 0.5000 mg | INTRAMUSCULAR | Status: DC | PRN

## 2019-09-25 MED ORDER — SODIUM CHLORIDE 0.9 % IV SOLN
12.5000 mg | Freq: Four times a day (QID) | INTRAVENOUS | Status: DC | PRN
  Filled 2019-09-25: qty 0.5

## 2019-09-25 MED ORDER — FENTANYL 2500MCG IN NS 250ML (10MCG/ML) PREMIX INFUSION: 0.0000 ug/h | INTRAVENOUS | Status: DC

## 2019-09-25 MED ORDER — HALOPERIDOL LACTATE 2 MG/ML PO CONC
0.5000 mg | ORAL | Status: DC | PRN
  Filled 2019-09-25: qty 0.3

## 2019-09-25 MED ORDER — BIOTENE DRY MOUTH MT LIQD: 15.0000 mL | OROMUCOSAL | Status: DC | PRN

## 2019-09-25 MED ORDER — GLYCOPYRROLATE 1 MG PO TABS: 1.0000 mg | ORAL_TABLET | ORAL | Status: DC | PRN

## 2019-09-25 MED ORDER — GLYCOPYRROLATE 0.2 MG/ML IJ SOLN
0.2000 mg | INTRAMUSCULAR | Status: DC | PRN
  Administered 2019-09-25 – 2019-09-26 (×3): 0.2 mg via INTRAVENOUS
  Filled 2019-09-25 (×3): qty 1

## 2019-09-25 MED ORDER — ACETAMINOPHEN 325 MG PO TABS: 650.0000 mg | ORAL_TABLET | Freq: Four times a day (QID) | ORAL | Status: DC | PRN

## 2019-09-25 MED ORDER — LORAZEPAM 1 MG PO TABS: 1.0000 mg | ORAL_TABLET | ORAL | Status: DC | PRN

## 2019-09-25 MED ORDER — HALOPERIDOL 0.5 MG PO TABS
0.5000 mg | ORAL_TABLET | ORAL | Status: DC | PRN
  Filled 2019-09-25: qty 1

## 2019-09-25 MED ORDER — POLYVINYL ALCOHOL 1.4 % OP SOLN
1.0000 [drp] | Freq: Four times a day (QID) | OPHTHALMIC | Status: DC | PRN
  Filled 2019-09-25: qty 15

## 2019-09-25 MED ORDER — LORAZEPAM 2 MG/ML IJ SOLN
2.0000 mg | Freq: Once | INTRAMUSCULAR | Status: AC
  Administered 2019-09-25: 2 mg via INTRAVENOUS
  Filled 2019-09-25: qty 1

## 2019-09-25 MED ORDER — GLYCOPYRROLATE 0.2 MG/ML IJ SOLN: 0.2000 mg | INTRAMUSCULAR | Status: DC | PRN

## 2019-09-25 MED ORDER — ONDANSETRON HCL 4 MG/2ML IJ SOLN: 4.0000 mg | Freq: Four times a day (QID) | INTRAMUSCULAR | Status: DC | PRN

## 2019-09-25 MED ORDER — ONDANSETRON 4 MG PO TBDP: 4.0000 mg | ORAL_TABLET | Freq: Four times a day (QID) | ORAL | Status: DC | PRN

## 2019-09-25 MED ORDER — ACETAMINOPHEN 650 MG RE SUPP: 650.0000 mg | Freq: Four times a day (QID) | RECTAL | Status: DC | PRN

## 2019-09-25 MED ORDER — LORAZEPAM 2 MG/ML PO CONC: 1.0000 mg | ORAL | Status: DC | PRN

## 2019-09-25 MED ORDER — LORAZEPAM 2 MG/ML IJ SOLN
1.0000 mg | INTRAMUSCULAR | Status: DC | PRN
  Administered 2019-09-25 – 2019-09-27 (×3): 1 mg via INTRAVENOUS
  Filled 2019-09-25 (×2): qty 1

## 2019-09-25 MED ORDER — DIPHENHYDRAMINE HCL 50 MG/ML IJ SOLN: 12.5000 mg | INTRAMUSCULAR | Status: DC | PRN

## 2019-09-25 MED ORDER — GLYCOPYRROLATE 0.2 MG/ML IJ SOLN
0.6000 mg | Freq: Three times a day (TID) | INTRAMUSCULAR | Status: DC
  Administered 2019-09-25 – 2019-09-27 (×7): 0.6 mg via INTRAVENOUS
  Filled 2019-09-25 (×7): qty 3

## 2019-09-25 MED ORDER — LORAZEPAM 2 MG/ML IJ SOLN: 1.0000 mg | INTRAMUSCULAR | Status: DC | PRN

## 2019-09-25 MED ORDER — LORAZEPAM 2 MG/ML IJ SOLN
1.0000 mg | Freq: Four times a day (QID) | INTRAMUSCULAR | Status: DC
  Administered 2019-09-27 (×3): 1 mg via INTRAVENOUS
  Filled 2019-09-25 (×4): qty 1
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
## 2019-09-25 NOTE — Procedures (Signed)
Extubation Procedure Note  Patient Details:   Name: [name] DOB: [birth year] MRN: [id number]   Airway Documentation:    Vent end date: 09/25/19 Vent end time: 1050   Evaluation  O2 sats: currently acceptable Complications: No apparent complications Patient did tolerate procedure well. Bilateral Breath Sounds: Diminished, Rhonchi   No   Pt terminally extubated per MD order.  Family and RN at bedside.  Meds given for comfort.  Pt is a DNR.  RT will continue to monitor.  Pierre Bali 09/25/2019, 10:57 AM
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
## 2019-09-25 NOTE — Progress Notes (Signed)
Support given to patient, daughter, granddaughters and patients brother today. All questions answered and information given on charity funeral arrangements to patients daughter.
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
## 2019-09-26 DIAGNOSIS — J9600 Acute respiratory failure, unspecified whether with hypoxia or hypercapnia: Secondary | ICD-10-CM

## 2019-09-26 LAB — PATHOLOGIST SMEAR REVIEW

## 2019-09-26 MED ORDER — FENTANYL BOLUS VIA INFUSION
50.0000 ug | INTRAVENOUS | Status: DC | PRN
  Filled 2019-09-26: qty 100

## 2019-09-26 NOTE — Progress Notes (Signed)
Received call from pt's brother [name], he wanted clarification of how the family meeting was cancelled and the pt's daughter able to make medical decisions that led to the pt being removed from life support. This AC advised [name] that [name] (pt's daughter) is his next of kin and able to make decisions on her father's behalf.  Per, [name] the pt wouldn't want to live on life support and she had him removed and he is now breathing on his own. I also advised that it is unfortunate that the family did not agree, but she can make that decision because she is his next of kin and the hospital can not get involved in family dynamics. He seemed apprehensive about the information, so this [facility] offered Social work and [employer].  [name] declined, and stated "we will just speak to a lawyer." [email or web address]
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
## 2019-09-26 NOTE — Progress Notes (Signed)
Chaplain visited with the family to follow-up on the declining health of their loved one.  The brother who was at bedside had an incredible use of spirituality for coping.  He seemed to have reached a place of acceptance and was rallying the rest of the family to that place.  The chaplain will follow-up as needed, but does not believe that need will arise.  Brion Aliment Chaplain Resident For questions concerning this note please contact me by pager [phone]

## 2019-09-26 NOTE — Care Management (Signed)
Unable to provide SBIRT assessment due mental status; pt not interactive currently.    Reinaldo Raddle, RN, BSN  Trauma/Neuro ICU Case Manager [phone]

## 2019-09-26 NOTE — Progress Notes (Signed)
Nutrition Brief Note  Chart reviewed. Pt now transitioning to comfort care.  No further nutrition interventions warranted at this time.  Please re-consult as needed.   Amara Manalang RD, LDN, CNSC [phone] Pager [phone] After Hours Pager    
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
## 2019-10-11 NOTE — Death Summary Note (Signed)
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 Orthopedic screw over the right acetabulum. Partially visualized screw over the right sacrum bridging the sacroiliac joint. IMPRESSION: Fixation of comminuted right proximal femoral fracture with hardware intact and near anatomic alignment over the fracture site. Electronically Signed   By: Elberta Fortis M.D.   On: 09/17/2019 11:58   Dg Femur Port, Min 2 Views Right  Result Date: 09/15/2019 CLINICAL DATA:  External fixation of the right lower extremity EXAM: RIGHT FEMUR PORTABLE 2 VIEW COMPARISON:  09/25/2019 FINDINGS: Again noted is a highly comminuted fracture of the proximal right femoral diaphysis. The alignment is slightly improved from prior study. A right-sided  central venous catheter is again noted. Subcutaneous calcifications are noted. Soft tissue edema is noted. IMPRESSION: Highly comminuted fracture of the proximal right femur. Electronically Signed   By: Katherine Mantle M.D.   On: 10/05/2019 01:49   [place] Ekg Site Rite  Result Date: 09/20/2019 If Site Rite image not attached, placement could not be confirmed due to current cardiac rhythm.   Microbiology No results found for this or any previous visit (from the past 240 hour(s)).  Lab Basic Metabolic Panel: No results for input(s): NA, K, CL, CO2, GLUCOSE, BUN, CREATININE, CALCIUM, MG, PHOS in the last 168 hours. Liver Function Tests: No results for input(s): AST, ALT, ALKPHOS, BILITOT, PROT, ALBUMIN in the last 168 hours. No results for input(s): LIPASE, AMYLASE in the last 168 hours. No results for input(s): AMMONIA in the last 168 hours. CBC: No results for input(s): WBC, NEUTROABS, HGB, HCT, MCV, PLT in the last 168 hours. Cardiac Enzymes: No results for input(s): CKTOTAL, CKMB, CKMBINDEX, TROPONINI in the last 168 hours. Sepsis Labs: No results for input(s): PROCALCITON, WBC, LATICACIDVEN in the last 168 hours.  Procedures/Operations  Please see above   Liz Malady 10/03/2019, 6:24 PM

## 2019-10-11 NOTE — Progress Notes (Signed)
Wasted 230 cc fentanyl gtt in waste bin with [place] Allen RN.
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
## 2019-10-11 DEATH — deceased

## 2019-10-29 ENCOUNTER — Other Ambulatory Visit: Payer: Self-pay | Admitting: Nurse Practitioner

## 2019-11-29 ENCOUNTER — Encounter (HOSPITAL_COMMUNITY): Payer: 59

## 2019-12-14 ENCOUNTER — Encounter (HOSPITAL_COMMUNITY): Payer: 59

## 2020-03-28 IMAGING — CR DG CHEST 2V
2 series · 2 of 2 positions shown · non-contrast
Comparison: 03/20/2018

CLINICAL DATA: Right chest pain

EXAM:
CHEST - 2 VIEW

[chest lat]
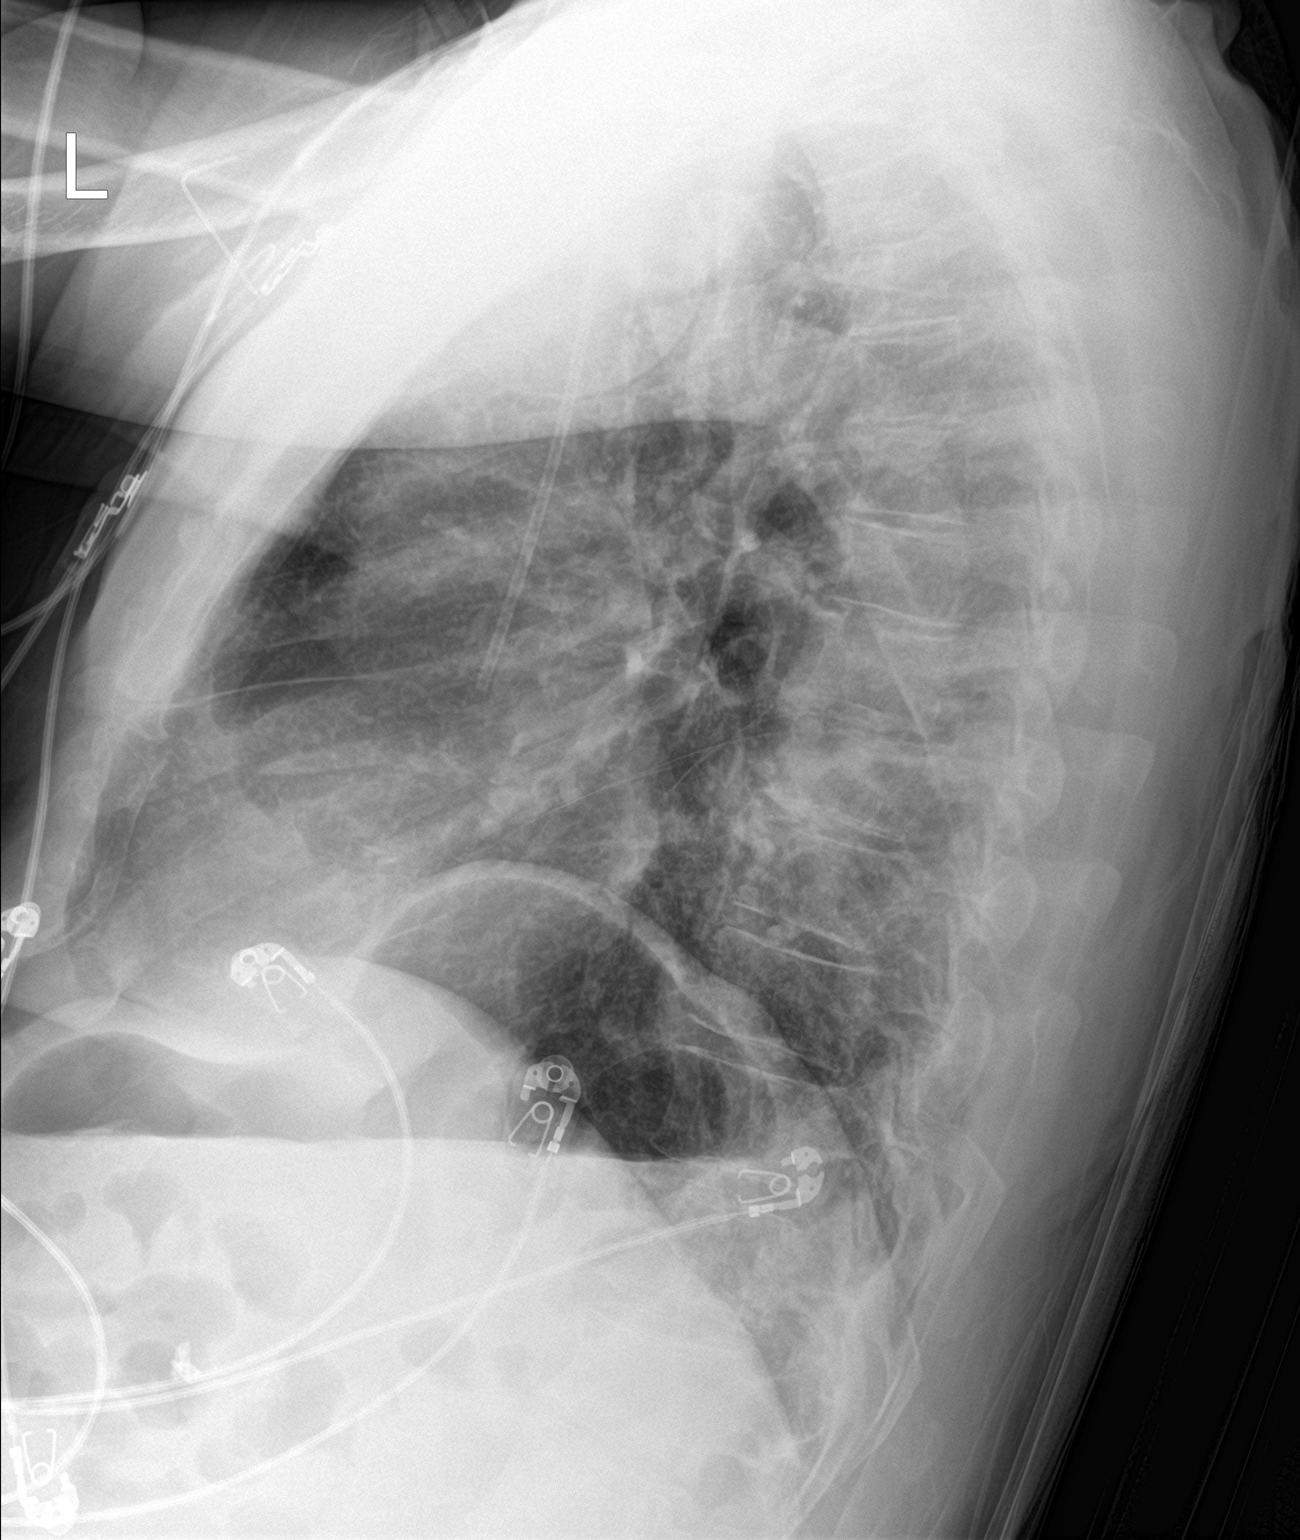

[chest ap]
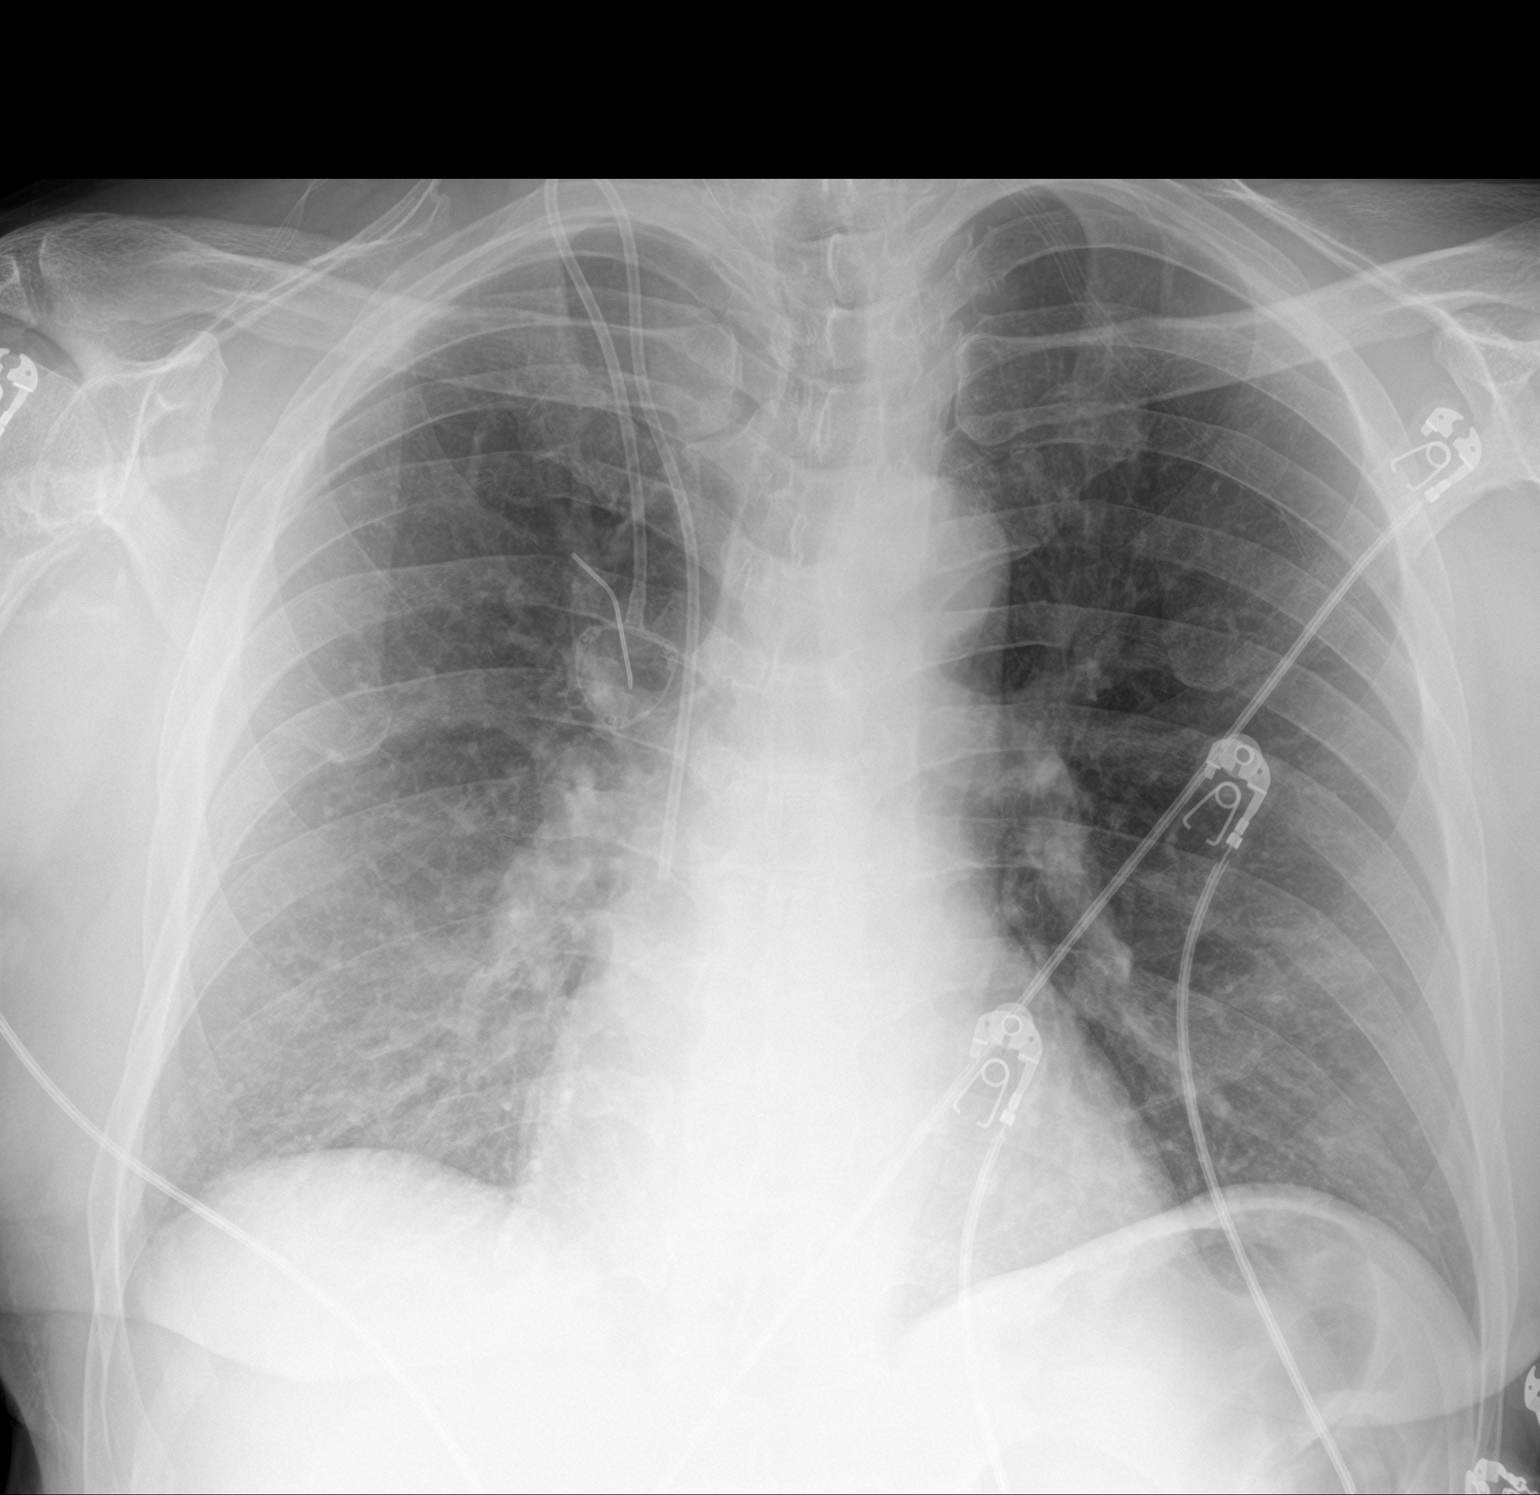

[2 of 2 positions shown; findings below may reference images not displayed]

FINDINGS: Right Port-A-Cath remains in place, unchanged. Heart is normal size.
No confluent airspace opacities or effusions. No acute bony
abnormality.
IMPRESSION: No active cardiopulmonary disease.

## 2020-04-20 IMAGING — CT CT CERVICAL SPINE W/O CM
5 of 8 series · 14 of 33 positions shown, 15 images · non-contrast
Comparison: Head CT dated 03/04/2018 and MRI dated 03/22/2018

CLINICAL DATA: 58-year-old male with fall.

EXAM:
CT HEAD WITHOUT CONTRAST
CT CERVICAL SPINE WITHOUT CONTRAST
TECHNIQUE: Multidetector CT imaging of the head and cervical spine was
performed following the standard protocol without intravenous
contrast. Multiplanar CT image reconstructions of the cervical spine
were also generated.

[Series 5: head bone · axial · 0.49mm/px · z∈[-81,-23]mm · 2 of 87 slices shown]
[im 29/87  bone]
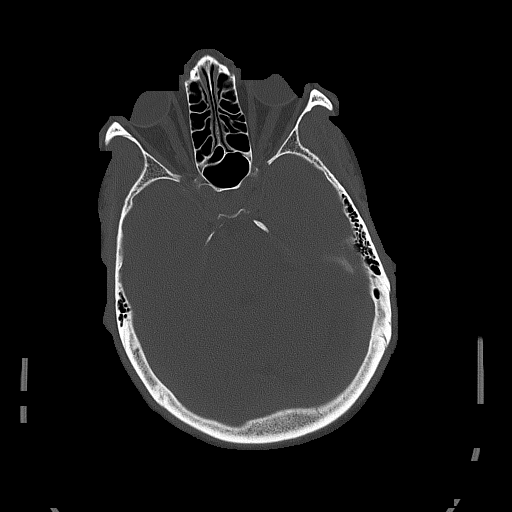
[im 58/87  bone]
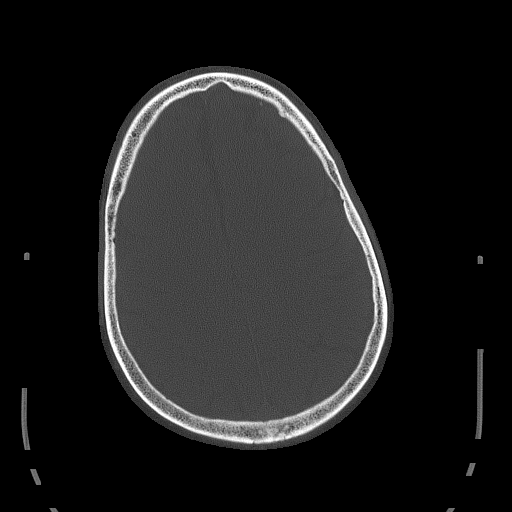

[Series 6: head without cor · coronal · non-contrast · 0.36mm/px · 3 of 79 slices shown]
[im 20/79  bone]
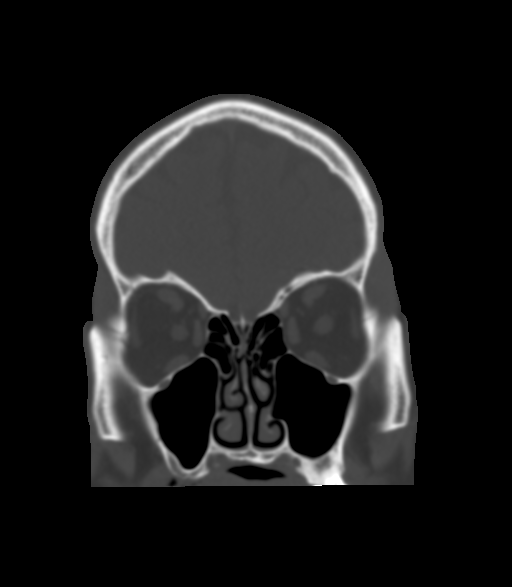
[im 40/79  bone]
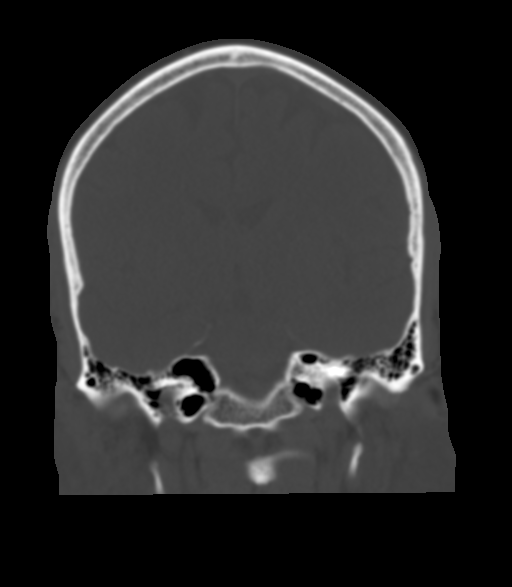
[im 59/79  bone]
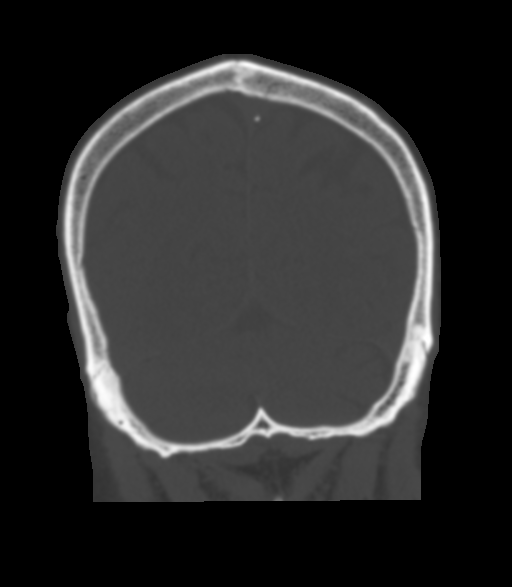

[Series 8: c_spine 2.0 st · axial · 0.47mm/px · z∈[-237,-129]mm · 3 of 108 slices shown, 4 images]
[im 27/108  soft-tissue]
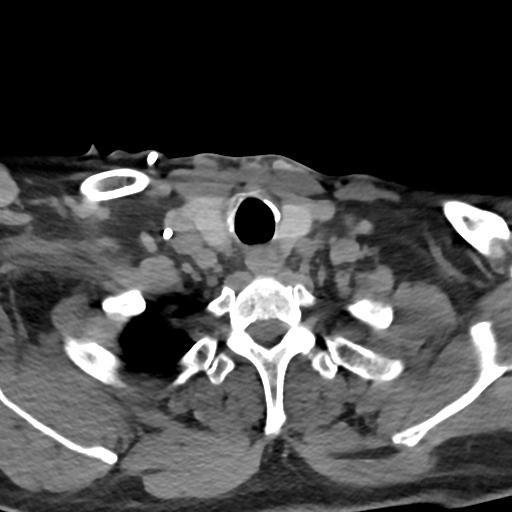
[im 27/108  bone]
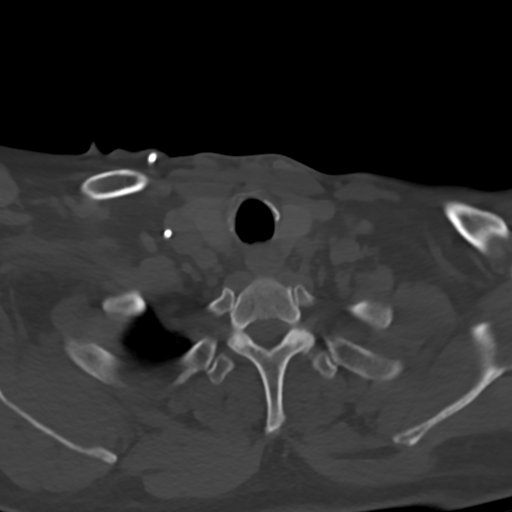
[im 54/108  bone]
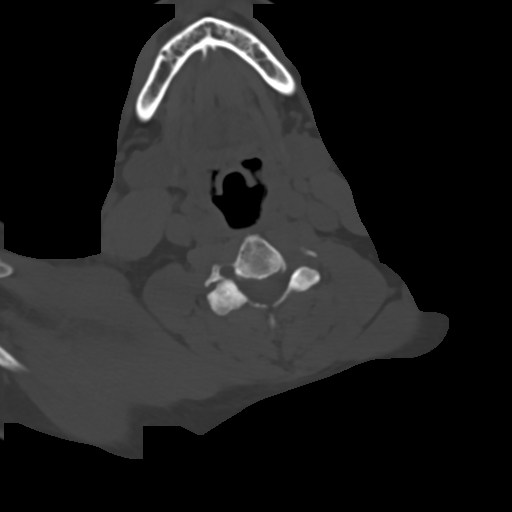
[im 81/108  bone]
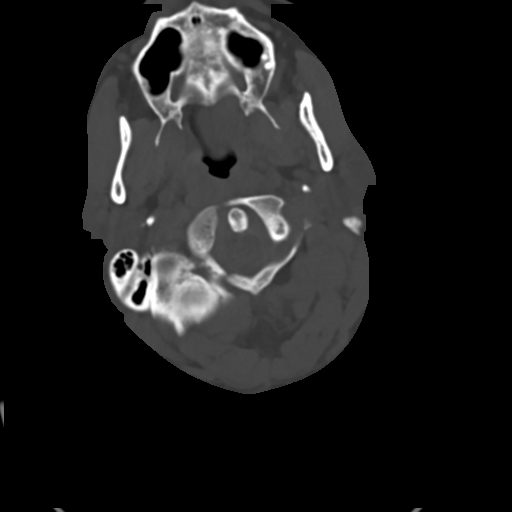

[Series 10: c_spine 2.0 sag bone · sagittal · 0.31mm/px · 4 of 61 slices shown]
[im 13/61  bone]
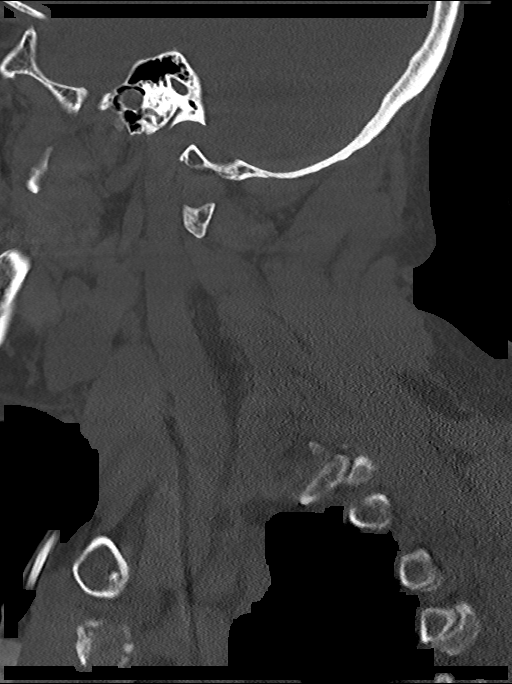
[im 25/61  bone]
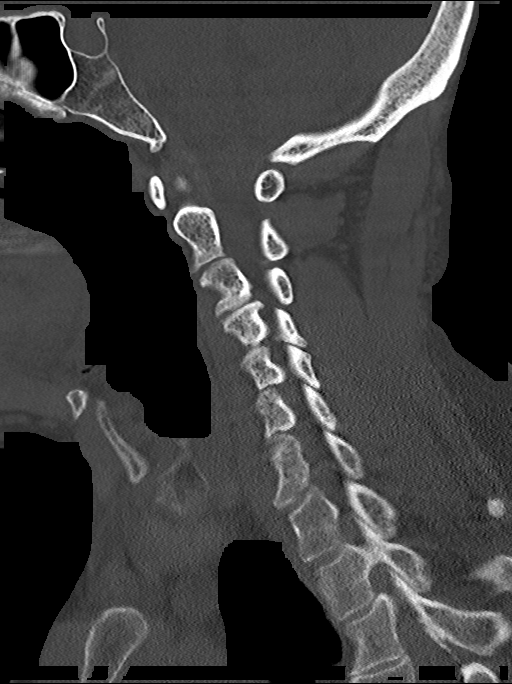
[im 37/61  bone]
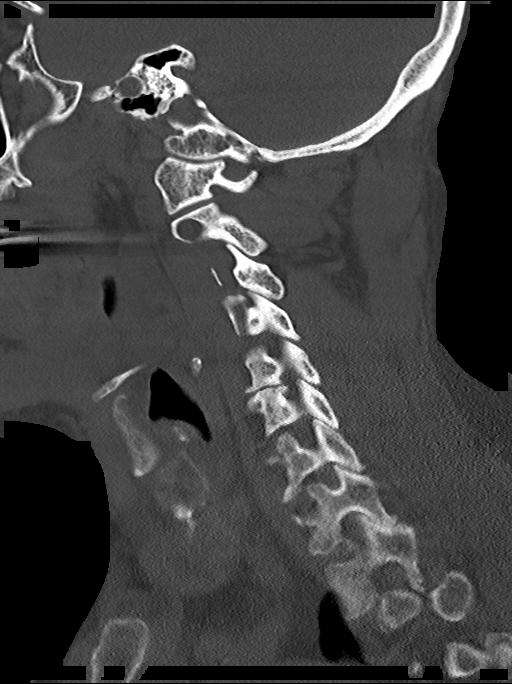
[im 49/61  bone]
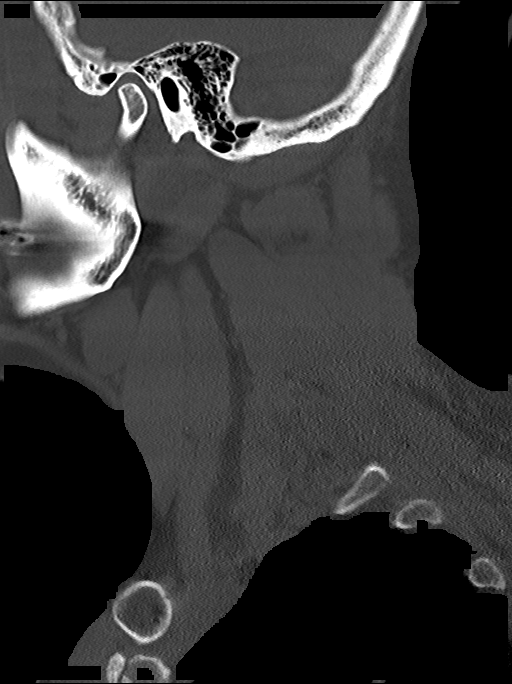

[Series 12: c_spine 2.0 orthogonals · axial · 0.21mm/px · z∈[-247,-190]mm · 2 of 99 slices shown]
[im 33/99  bone]
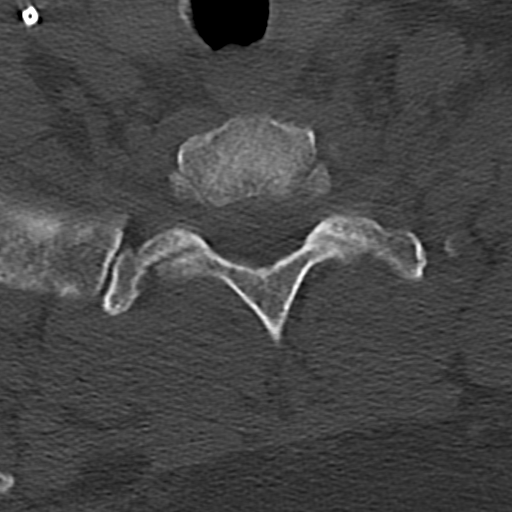
[im 66/99  bone]
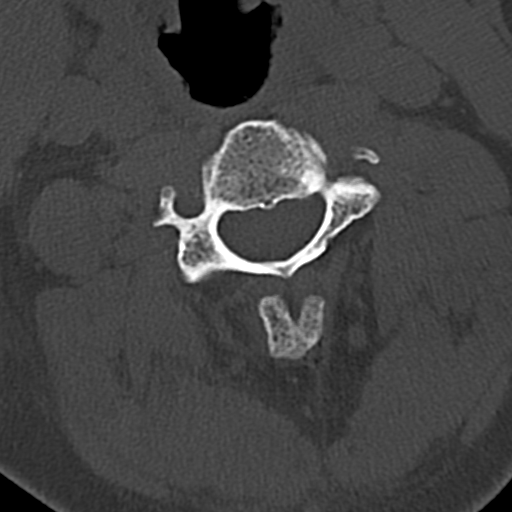

[14 of 33 positions shown; findings below may reference images not displayed]

FINDINGS: CT HEAD FINDINGS

Brain: The ventricles and sulci are appropriate size for patient's
age. Minimal periventricular and deep white matter hypodensities may
represent chronic microvascular ischemic changes or related to known
demyelinating disease. There is no acute intracranial hemorrhage. No
mass effect or midline shift. No extra-axial fluid collection.

Vascular: No hyperdense vessel or unexpected calcification.

Skull: Normal. Negative for fracture or focal lesion.

Sinuses/Orbits: No acute finding.

Other: None

CT CERVICAL SPINE FINDINGS

Alignment: No acute subluxation.

Skull base and vertebrae: No acute fracture. No primary bone lesion
or focal pathologic process.

Soft tissues and spinal canal: No prevertebral fluid or swelling. No
visible canal hematoma.

Disc levels:  Mild degenerative changes primarily at C5-C6.

Upper chest: Mild centrilobular emphysema and subpleural blebs.

Other: Partially visualized right pectoral Port-A-Cath. Mild
bilateral carotid bulb calcified plaques. Probable subcentimeter
right thyroid hypodense nodule.
IMPRESSION: 1. No acute intracranial pathology.
2. No acute/traumatic cervical spine pathology.

## 2020-04-20 IMAGING — DX DG KNEE COMPLETE 4+V*R*
4 series · 4 of 4 positions shown · non-contrast
Comparison: None.

CLINICAL DATA: Status post fall today with right knee pain.

EXAM:
RIGHT KNEE - COMPLETE 4+ VIEW

[knee ap]
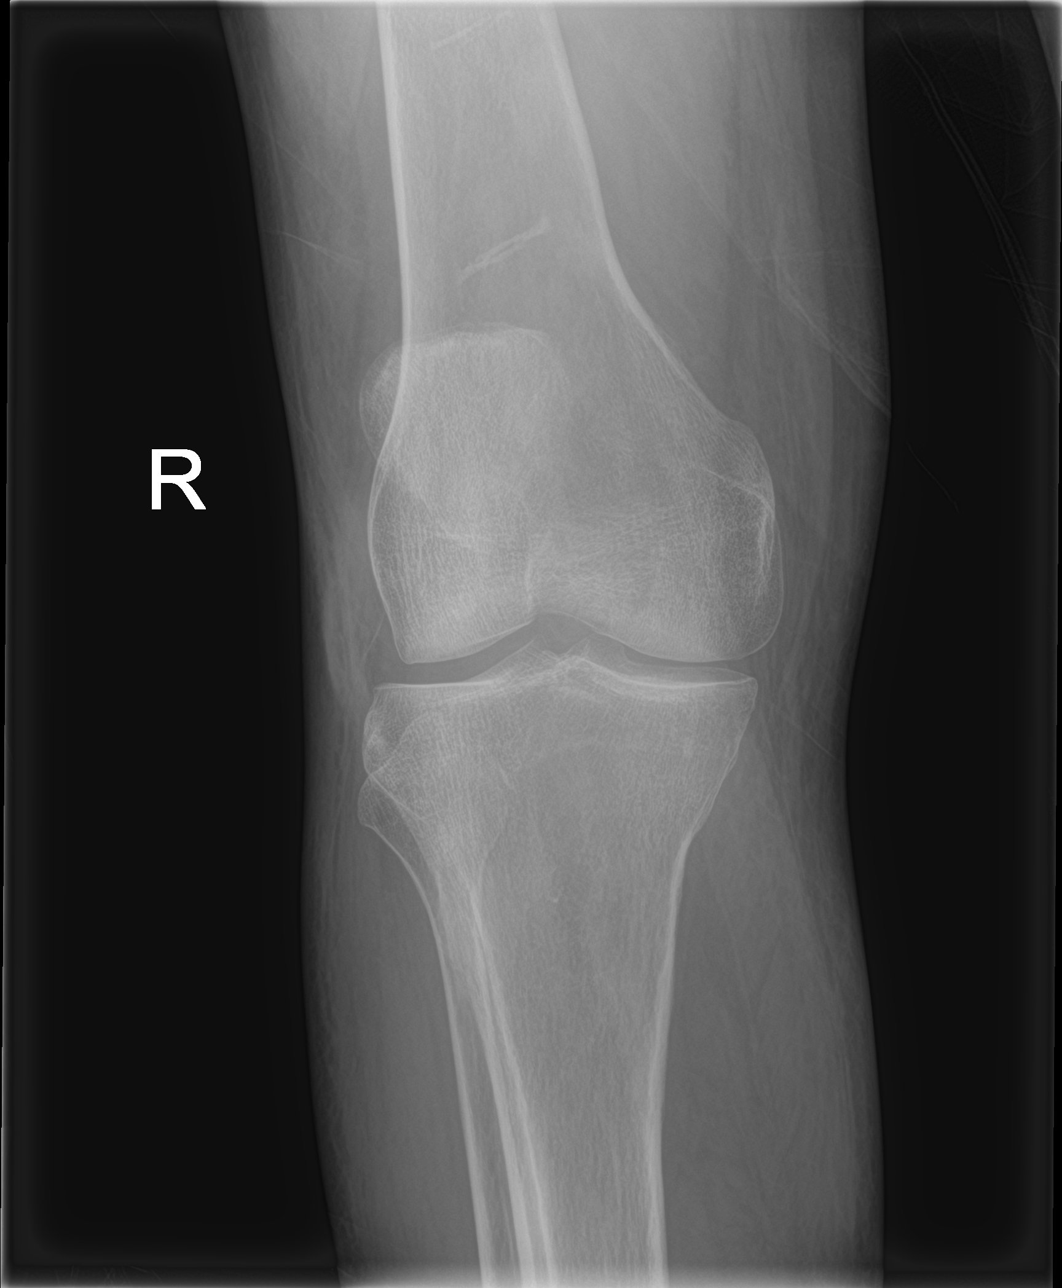

[knee lat]
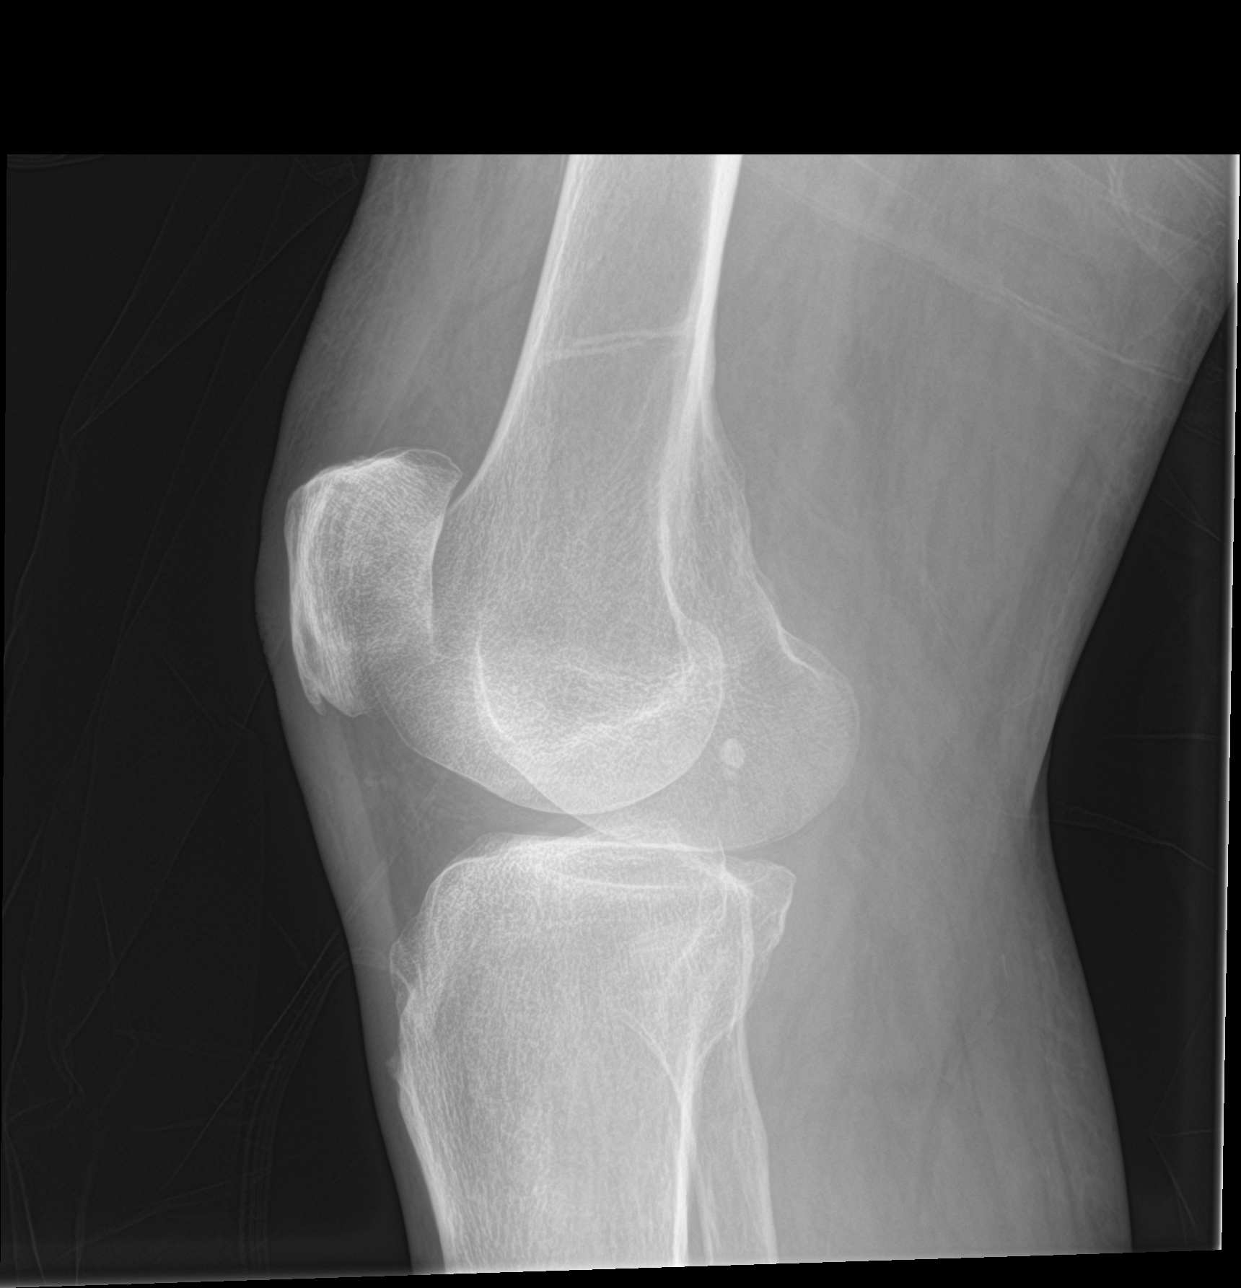

[knee obl (1 of 2)]
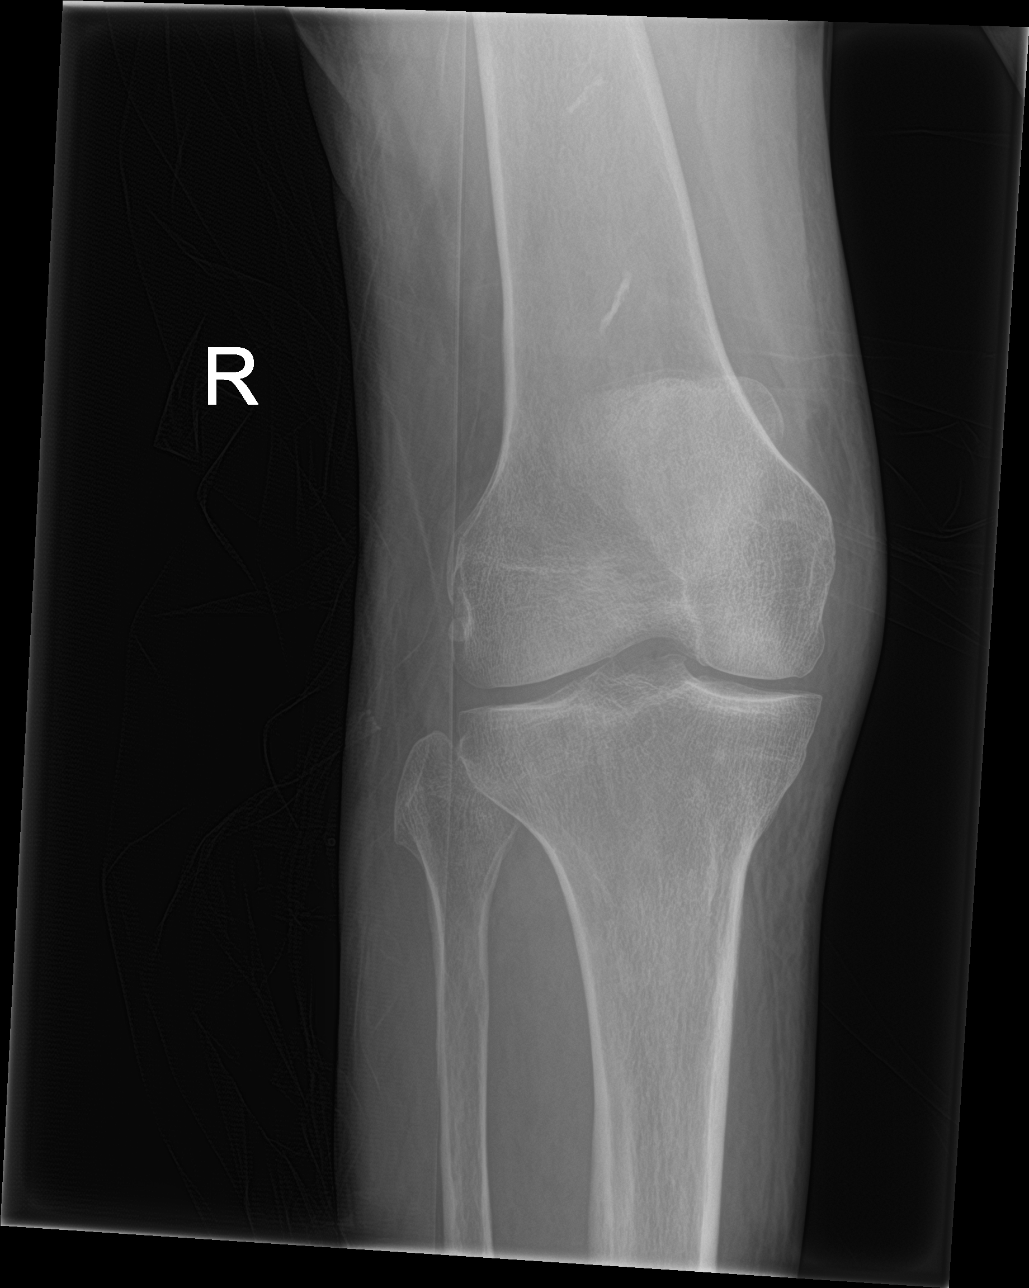

[knee obl (2 of 2)]
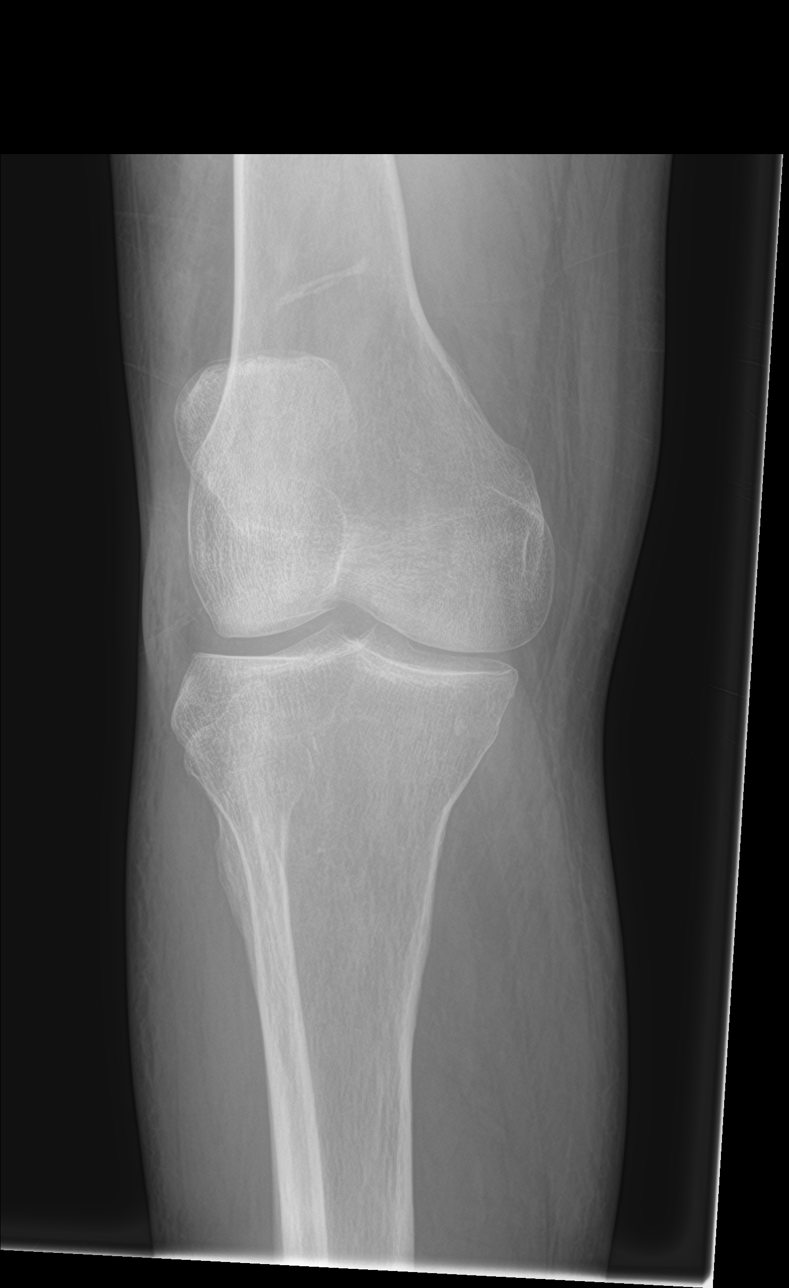

[4 of 4 positions shown; findings below may reference images not displayed]

FINDINGS: No evidence of fracture, dislocation. No evidence of arthropathy or
other focal bone abnormality. Soft tissues are unremarkable.
IMPRESSION: No acute fracture or dislocation.

## 2020-04-20 IMAGING — DX DG CHEST 2V
2 series · 2 of 2 positions shown · non-contrast
Comparison: November 04, 2018

CLINICAL DATA: Status post fall today.

EXAM:
CHEST - 2 VIEW

[chest lat]
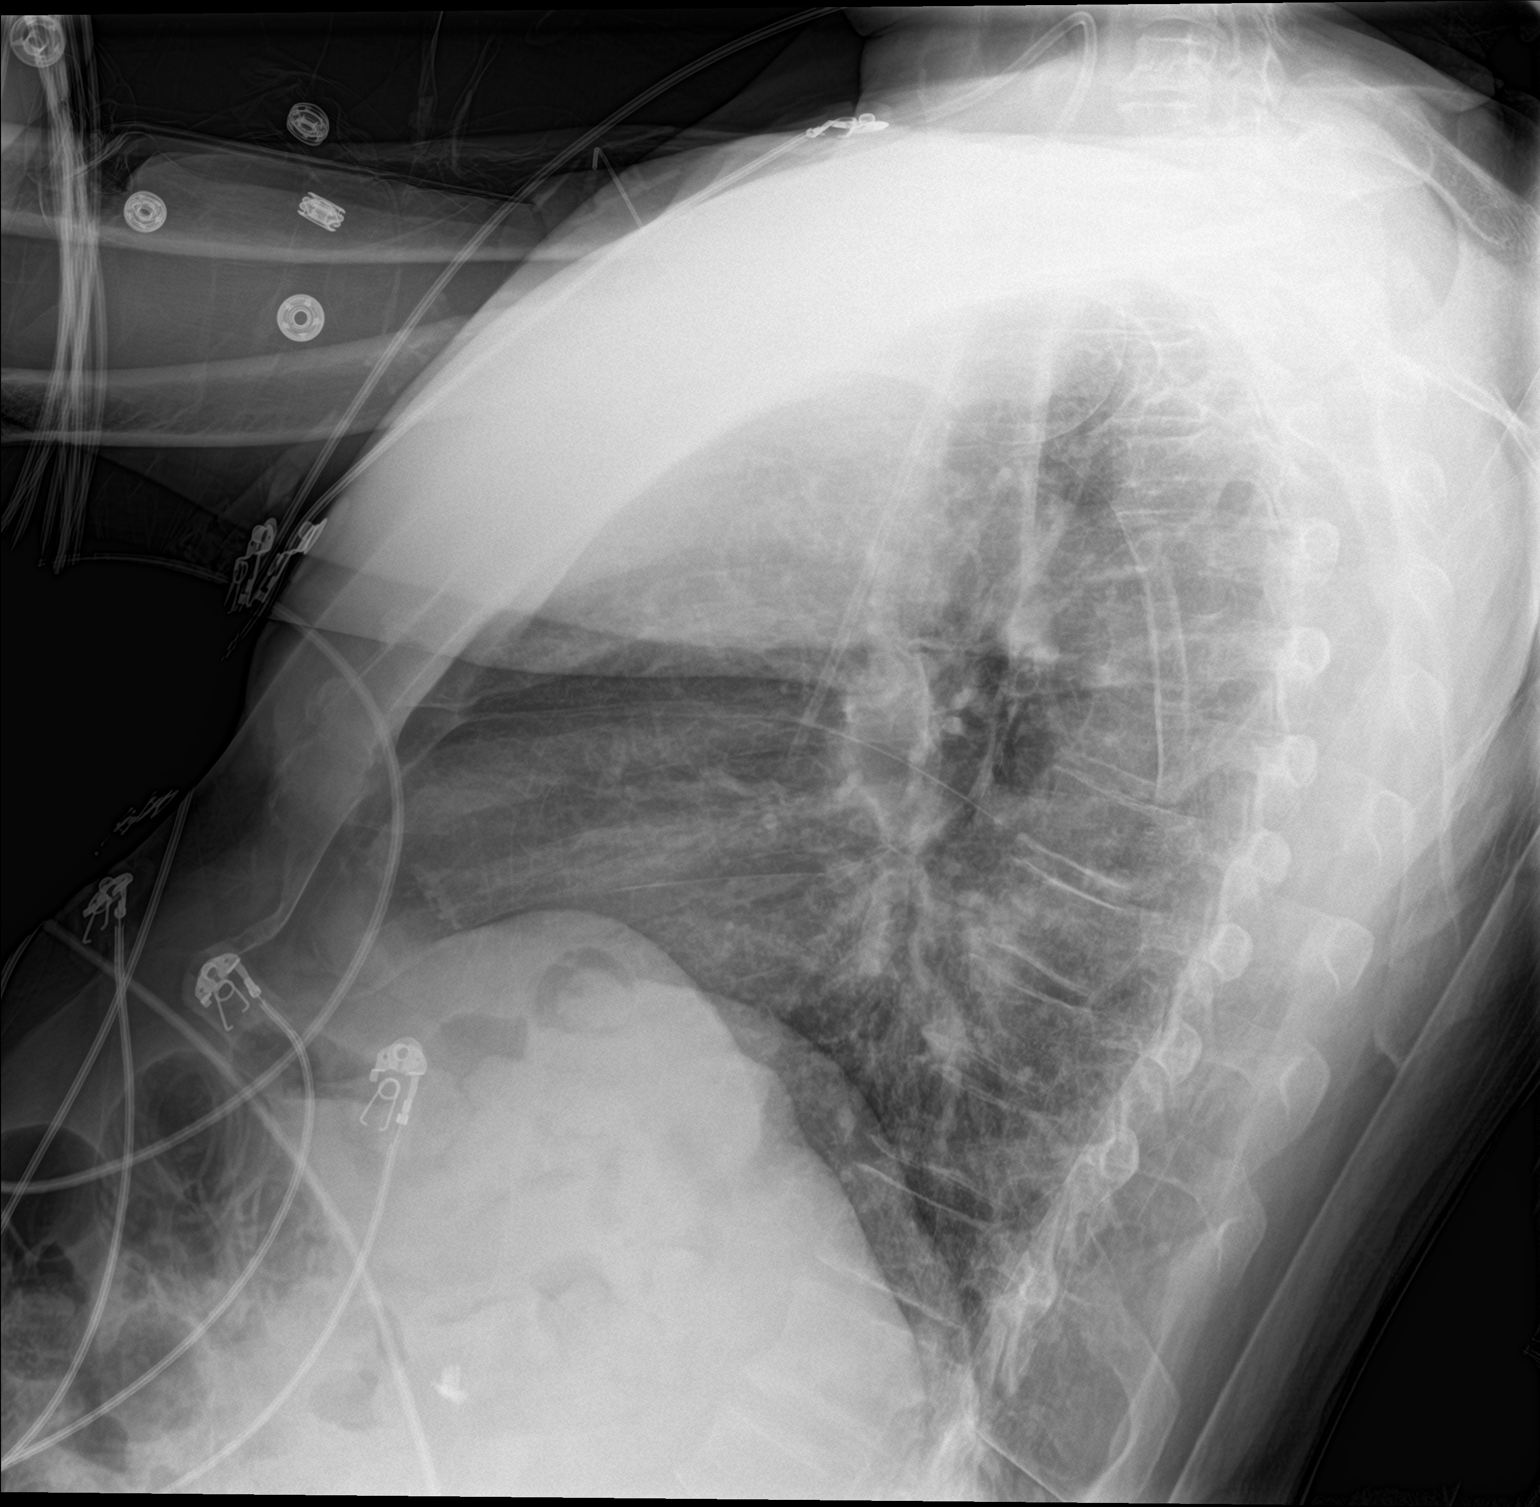

[chest ap]
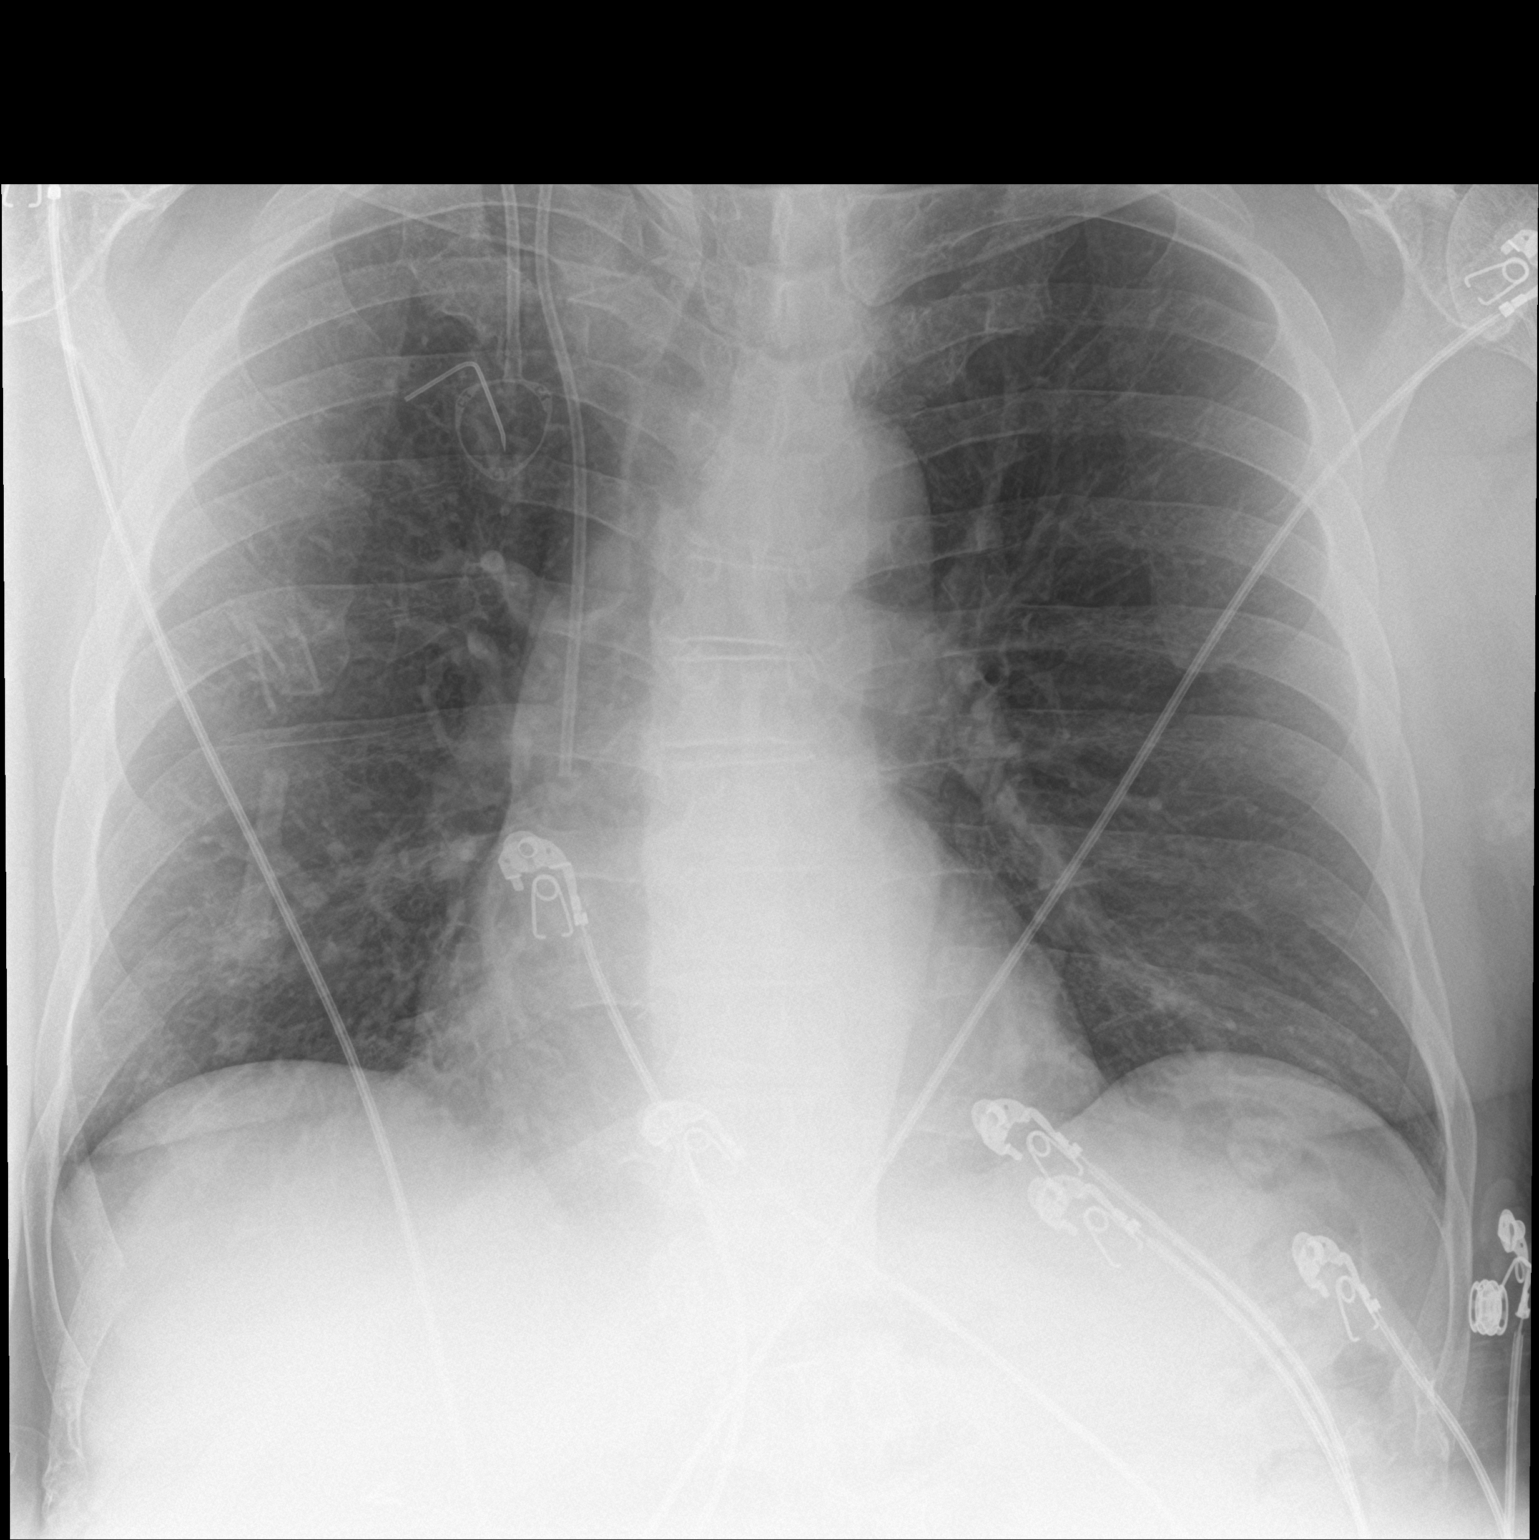

[2 of 2 positions shown; findings below may reference images not displayed]

FINDINGS: The heart size and mediastinal contours are stable. Right central
venous line is identified unchanged. Both lungs are clear. The
visualized skeletal structures are stable.
IMPRESSION: No active cardiopulmonary disease.

## 2020-04-20 IMAGING — DX DG ANKLE COMPLETE 3+V*R*
3 series · 3 of 3 positions shown · non-contrast
Comparison: None.

CLINICAL DATA: Status post fall today with right ankle pain.

EXAM:
RIGHT ANKLE - COMPLETE 3+ VIEW

[ankle ap]
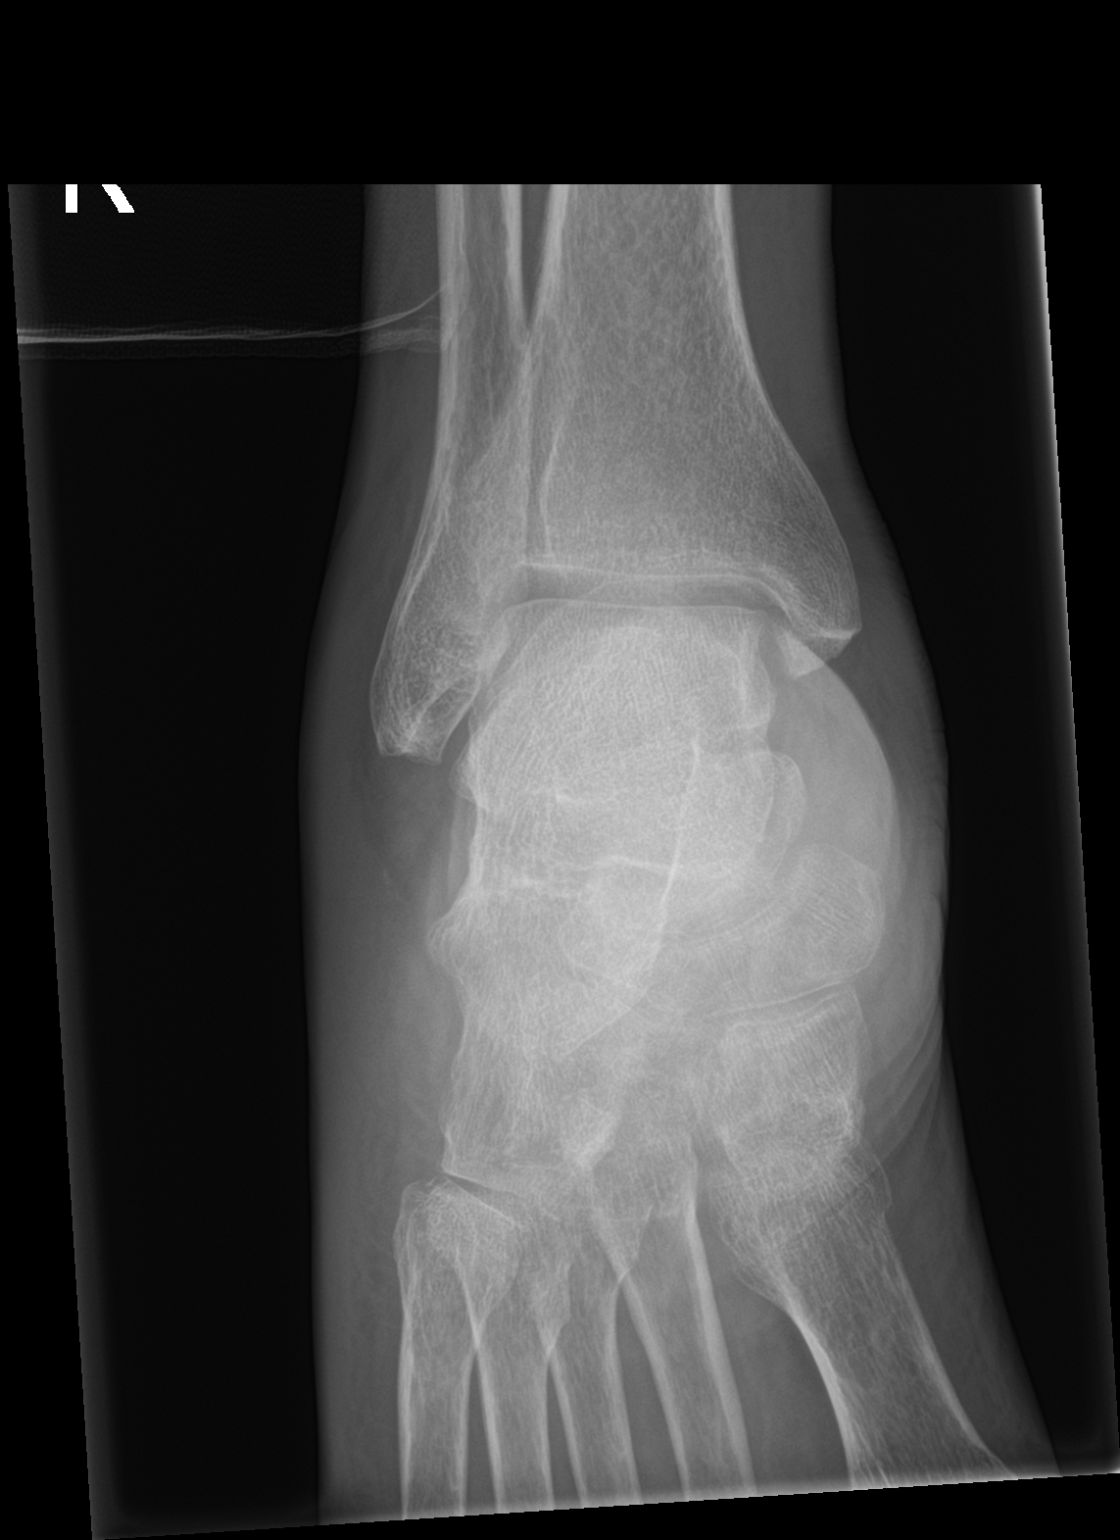

[ankle obl]
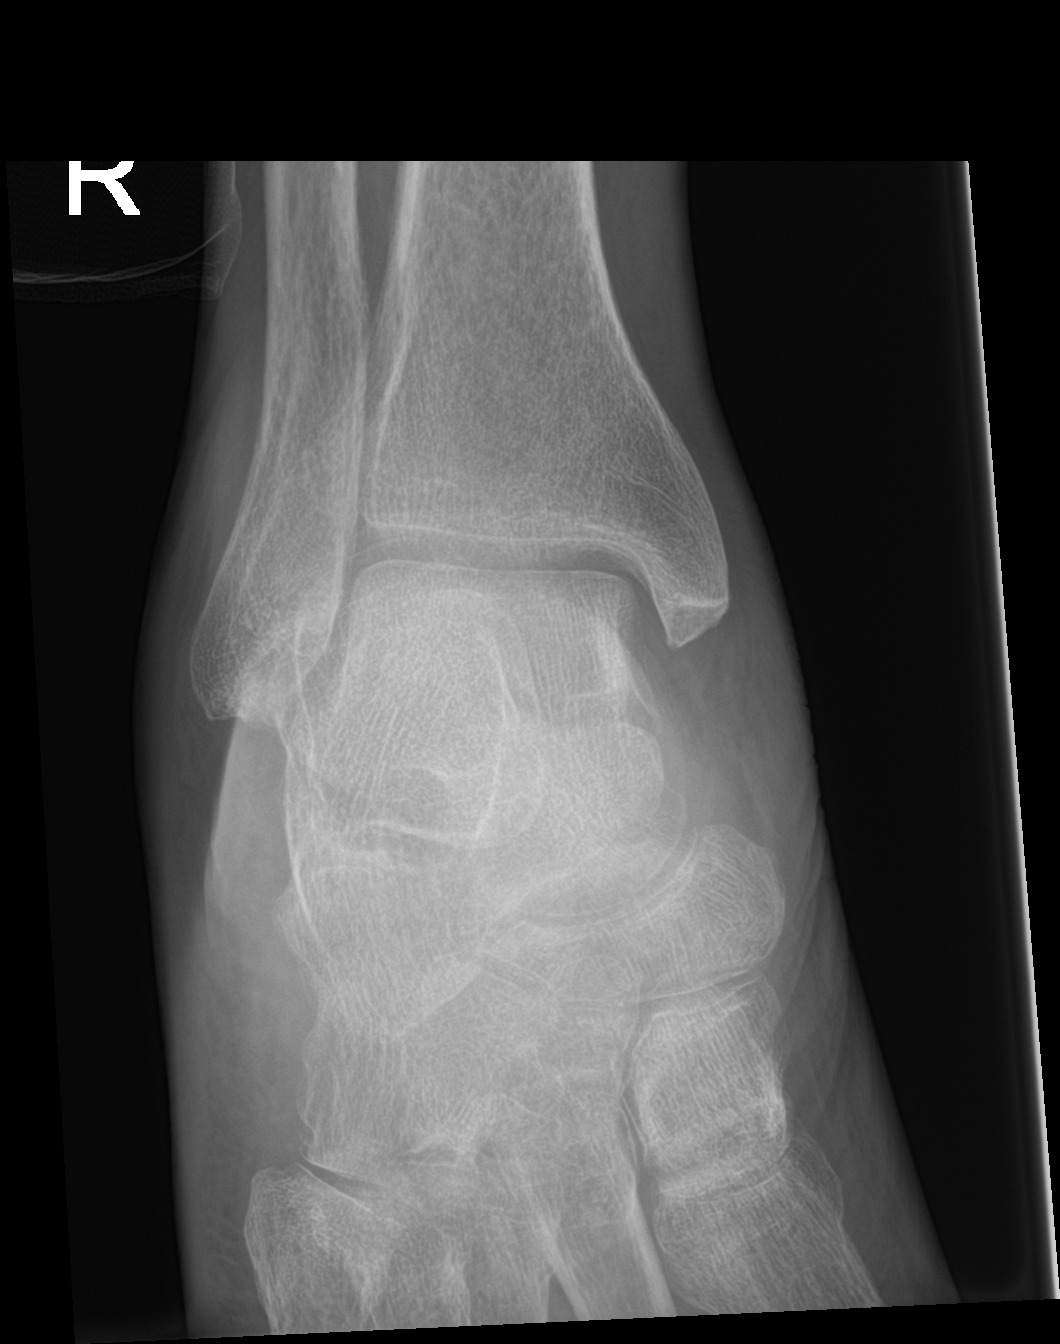

[ankle lat]
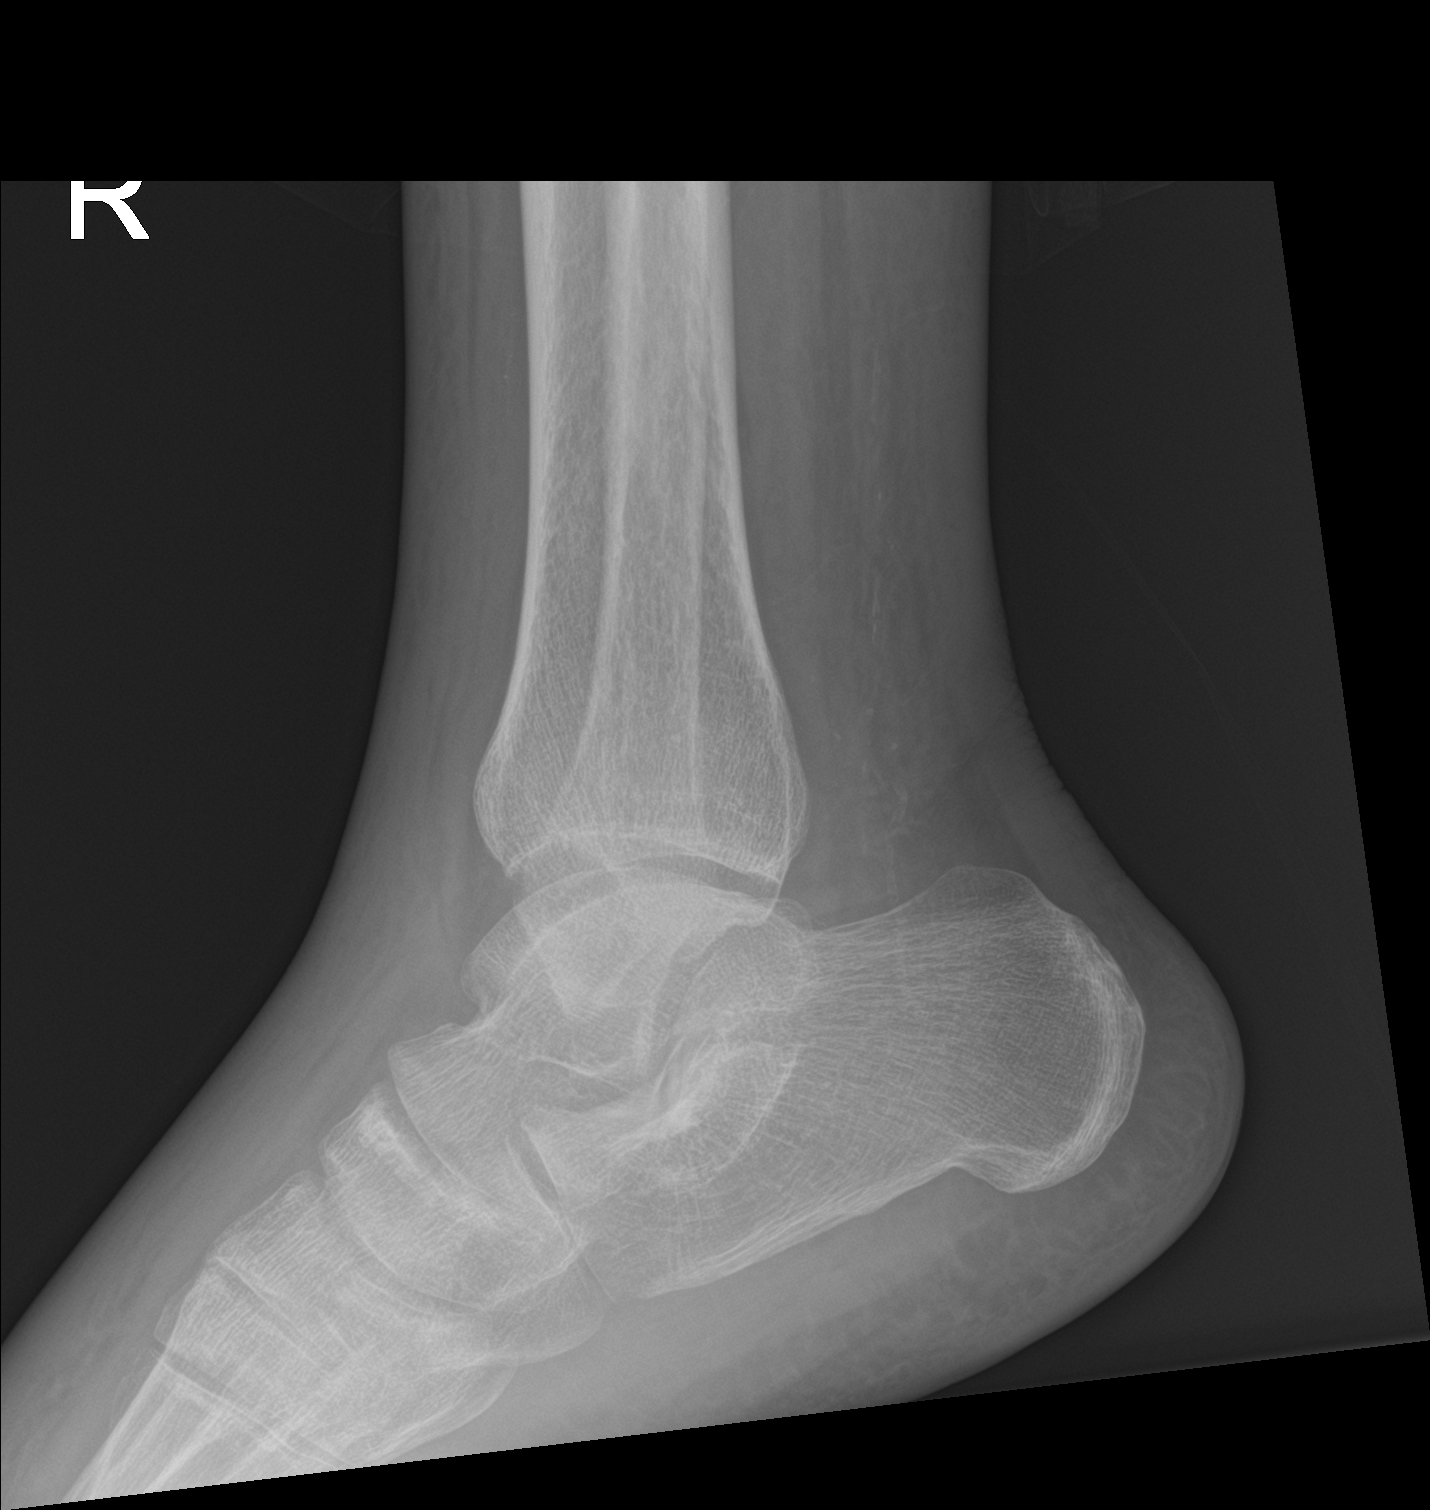

[3 of 3 positions shown; findings below may reference images not displayed]

FINDINGS: There is no evidence of fracture, dislocation, or joint effusion.
There is no evidence of arthropathy. Soft tissues are unremarkable.
IMPRESSION: No acute fracture or dislocation.

## 2020-04-20 IMAGING — DX DG TIBIA/FIBULA 2V*R*
2 series · 2 of 2 positions shown · non-contrast
Comparison: None.

CLINICAL DATA: Status post fall today with right leg pain.

EXAM:
RIGHT TIBIA AND FIBULA - 2 VIEW

[tibia ap]
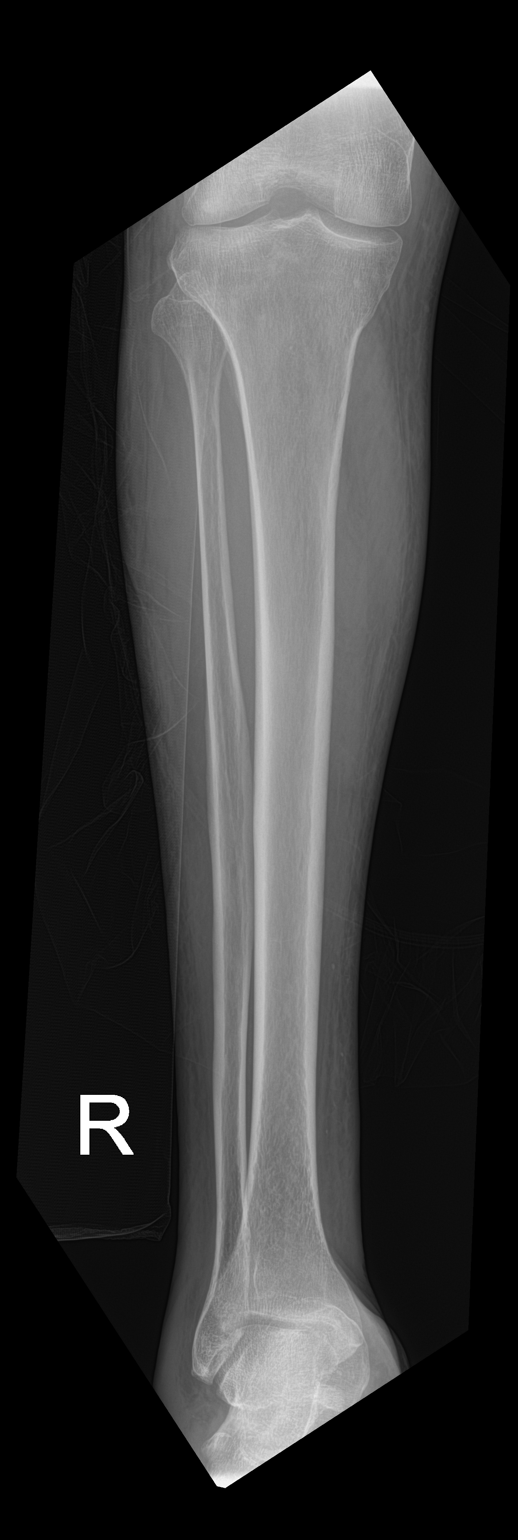

[tibia lat]
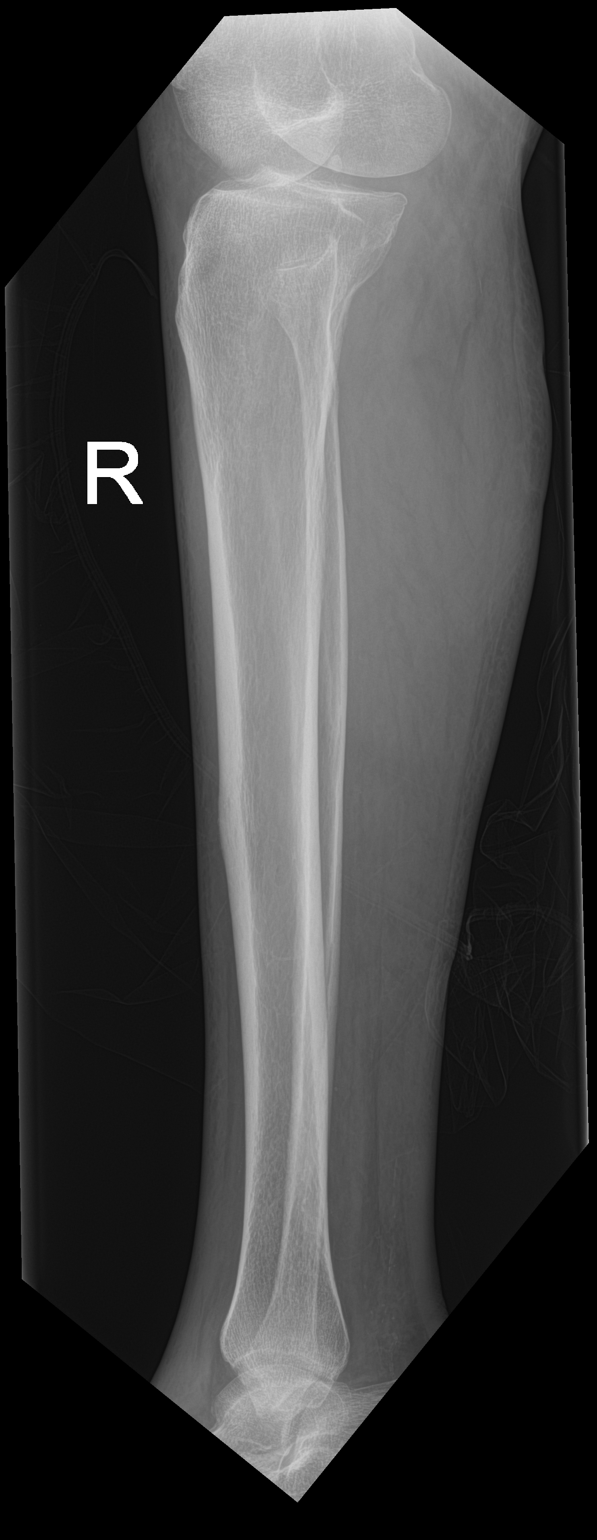

[2 of 2 positions shown; findings below may reference images not displayed]

FINDINGS: There is no evidence of fracture or dislocation. Soft tissues are
unremarkable.
IMPRESSION: Negative.

## 2020-09-06 IMAGING — MR MRI HEAD WITHOUT CONTRAST
11 series · 48 of 48 positions shown · non-contrast
Comparison: CT 11/27/2018. MRI 03/22/2018. multiple previous MRI
studies as distant as 10/04/2014.

CLINICAL DATA: History of multiple sclerosis. Bilateral lower
extremity weakness.

EXAM:
MRI HEAD WITHOUT CONTRAST
TECHNIQUE: Multiplanar, multiecho pulse sequences of the brain and surrounding
structures were obtained without intravenous contrast.

[Series 2: T1 · sagittal · 5.0mm · 0.49mm/px · 2 of 21 slices shown]
[im 1/21]
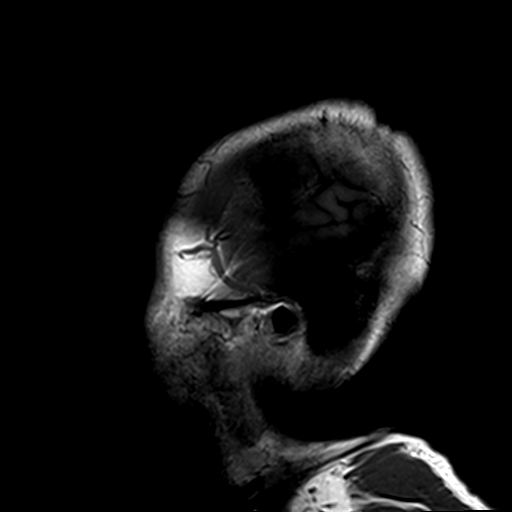
[im 21/21]
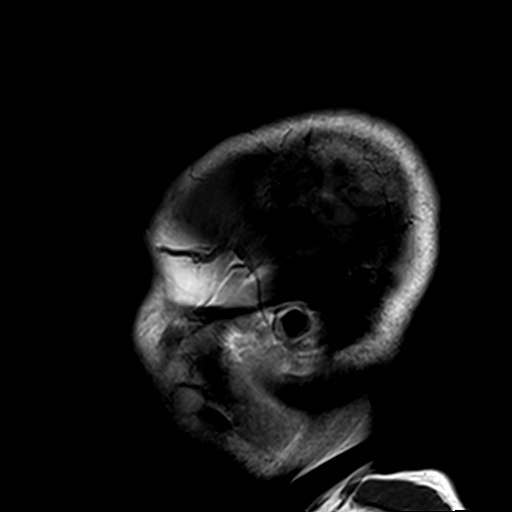

[Series 3: DWI · axial · 3.0mm · 1.80mm/px · z∈[-5,+141]mm · 8 of 100 slices shown (1 of 4)]
[im 1/100]
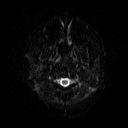
[im 15/100]
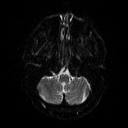
[im 29/100]
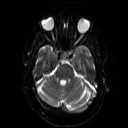
[im 43/100]
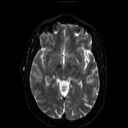
[im 57/100]
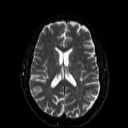
[im 71/100]
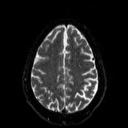
[im 85/100]
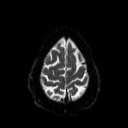
[im 100/100]
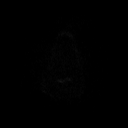

[Series 4: DWI · axial · 3.0mm · 1.80mm/px · z∈[-5,+141]mm · 4 of 47 slices shown (2 of 4)]
[im 1/47]
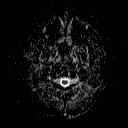
[im 16/47]
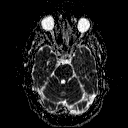
[im 31/47]
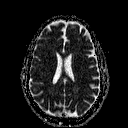
[im 47/47]
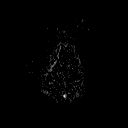

[Series 5: DWI · coronal · 5.0mm · 1.80mm/px · 6 of 72 slices shown (3 of 4)]
[im 1/72]
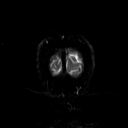
[im 15/72]
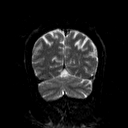
[im 29/72]
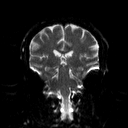
[im 43/72]
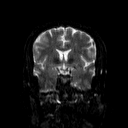
[im 57/72]
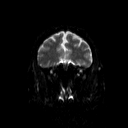
[im 72/72]
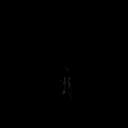

[Series 6: DWI · coronal · 5.0mm · 1.80mm/px · 3 of 36 slices shown (4 of 4)]
[im 1/36]
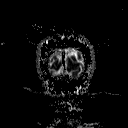
[im 18/36]
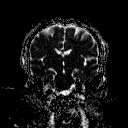
[im 36/36]
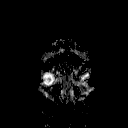

[Series 7: T2 · axial · 5.0mm · 0.51mm/px · z∈[-8,+147]mm · 2 of 24 slices shown (1 of 2)]
[im 1/24]
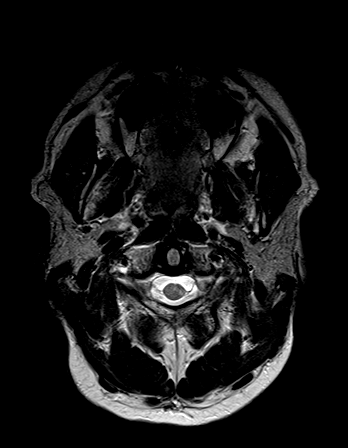
[im 24/24]
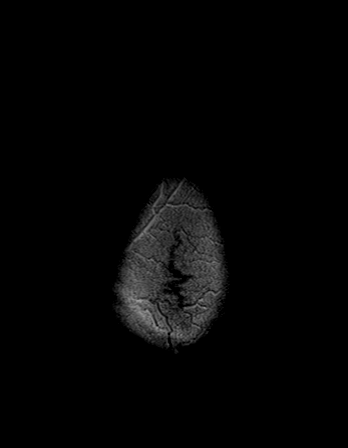

[Series 8: FLAIR · axial · 3.0mm · 0.45mm/px · z∈[-8,+145]mm · 3 of 34 slices shown (1 of 2)]
[im 1/34]
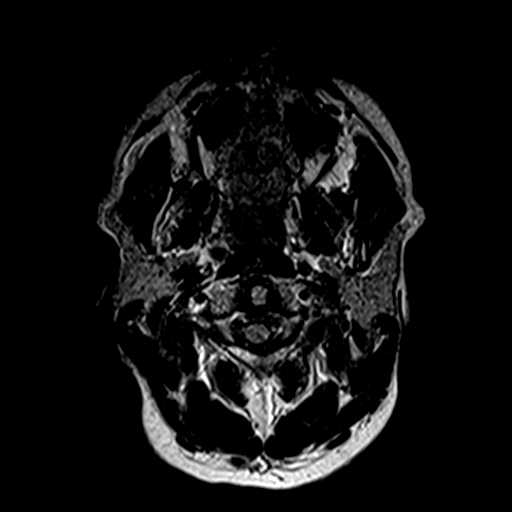
[im 17/34]
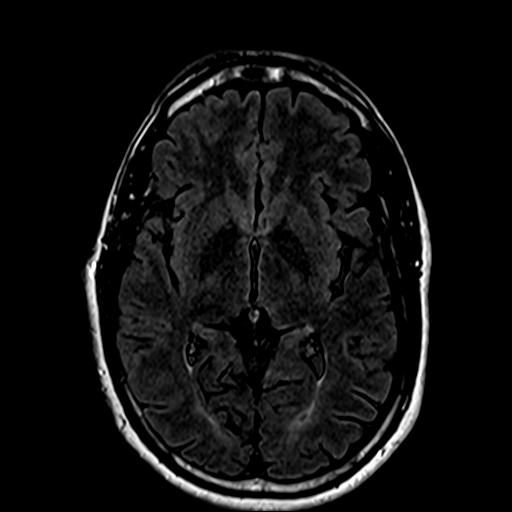
[im 34/34]
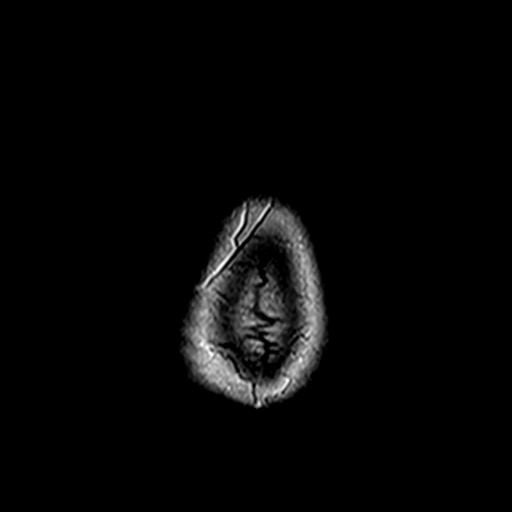

[Series 10: swi_images · axial · 4.0mm · 0.90mm/px · z∈[-17,+154]mm · 4 of 44 slices shown]
[im 1/44]
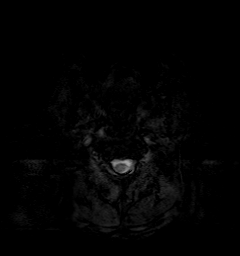
[im 15/44]
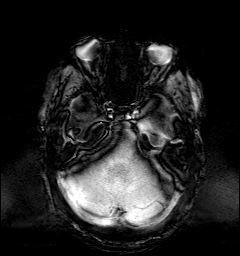
[im 29/44]
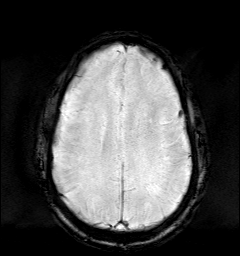
[im 44/44]
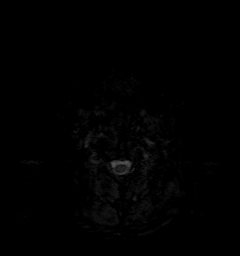

[Series 11: t1_mpr_tra · axial · 1.1mm · 0.75mm/px · z∈[-9,+148]mm · 12 of 144 slices shown]
[im 1/144]
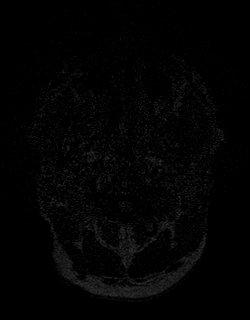
[im 14/144]
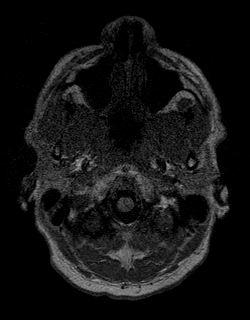
[im 27/144]
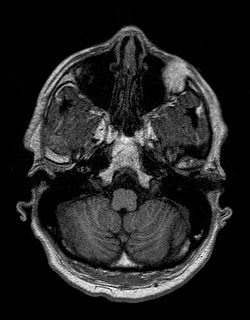
[im 40/144]
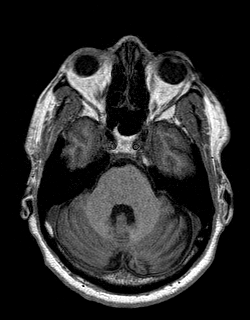
[im 53/144]
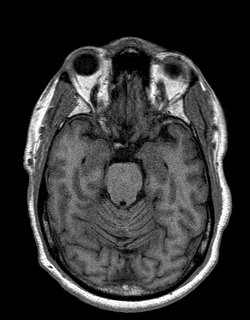
[im 66/144]
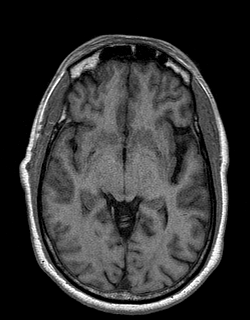
[im 79/144]
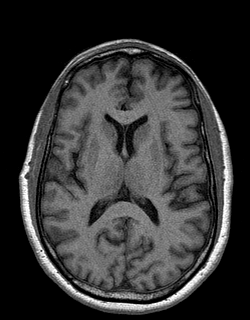
[im 92/144]
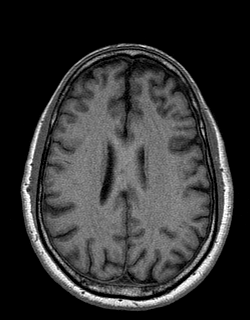
[im 105/144]
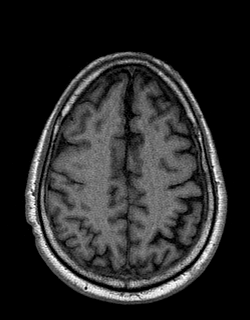
[im 118/144]
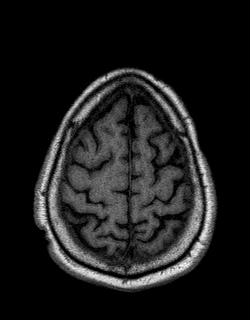
[im 131/144]
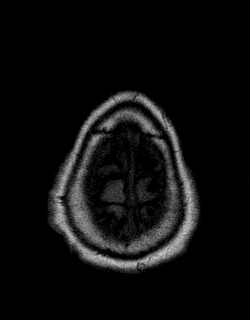
[im 144/144]
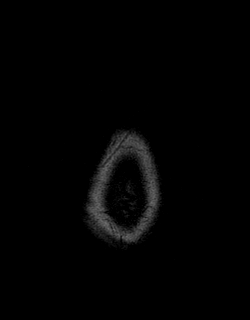

[Series 12: FLAIR · sagittal · 5.0mm · 0.47mm/px · 2 of 25 slices shown (2 of 2)]
[im 1/25]
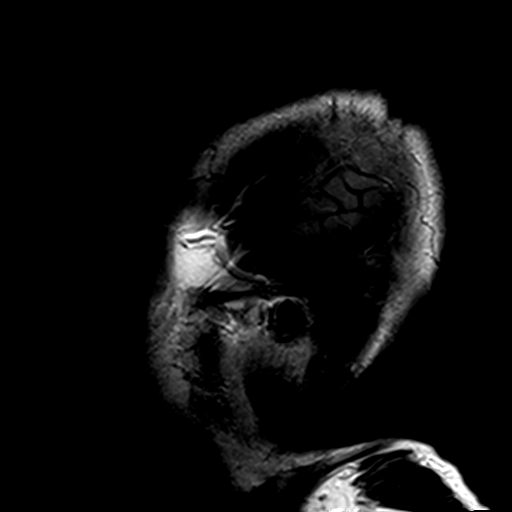
[im 25/25]
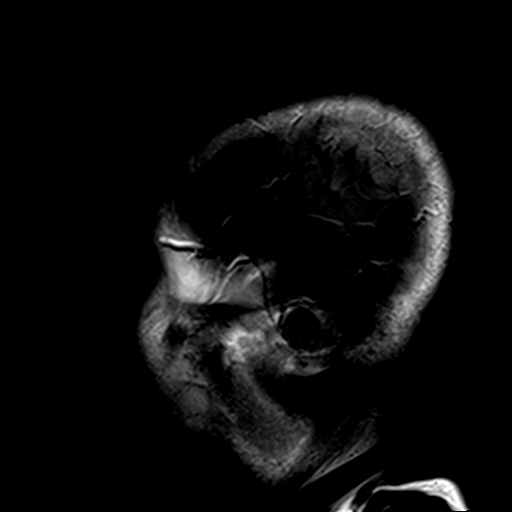

[Series 13: T2 · coronal · 5.0mm · 0.45mm/px · 2 of 30 slices shown (2 of 2)]
[im 1/30]
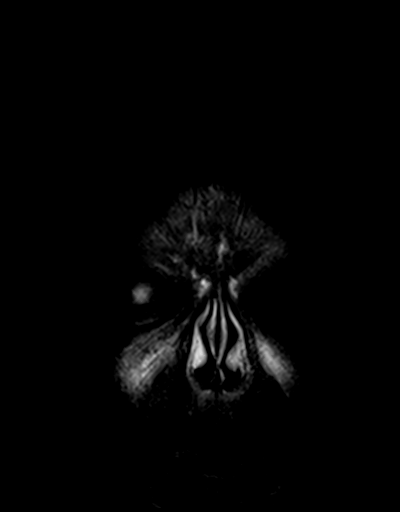
[im 30/30]
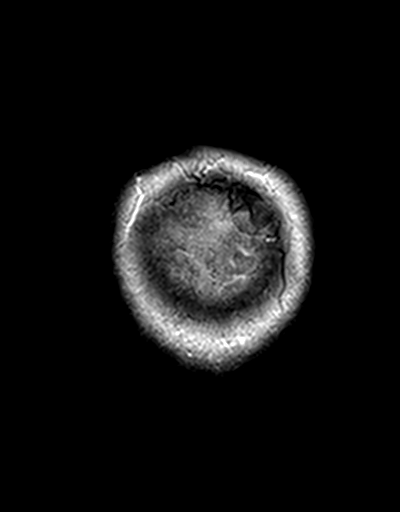

[48 of 48 positions shown; findings below may reference images not displayed]

FINDINGS: Brain: Venous access could not be achieved for contrast
administration. Diffusion imaging does not show any acute or
subacute infarction or other cause of restricted diffusion. The
brainstem is normal. There are a few scattered punctate foci of T2
and FLAIR signal within the cerebellum. Cerebral hemispheres show a
few scattered punctate foci of T2 and FLAIR signal within the deep
and subcortical white matter. The pattern is nonspecific and most
commonly associated with an early manifestation of small vessel
disease. Certainly, the possibility that these represent foci of
demyelination is not excluded. No large vessel territory
abnormality. No mass lesion, hemorrhage, hydrocephalus or
extra-axial collection.

Vascular: Major vessels at the base of the brain show flow.

Skull and upper cervical spine: Negative

Sinuses/Orbits: Clear/normal

Other: Suspicion of a small Rathke's cleft cyst of the pituitary
gland.
IMPRESSION: No change in the brain MRI. Scattered punctate foci of T2 and FLAIR
signal within the cerebellum and cerebral hemispheric deep and
subcortical white matter. The pattern is nonspecific and could
indicate an early manifestation of small vessel disease and or
demyelinating disease. No new or progressive lesions. Contrast
administration could not be performed because of lack of venous
access.

Incidental Rathke's cleft cyst of the pituitary gland, unchanged
over time.

## 2020-09-06 IMAGING — CR RIGHT KNEE - COMPLETE 4+ VIEW
4 series · 4 of 4 positions shown · non-contrast
Comparison: None.

CLINICAL DATA: Right knee pain, swelling.  No known injury.

EXAM:
RIGHT KNEE - COMPLETE 4+ VIEW

[w knee ap right]
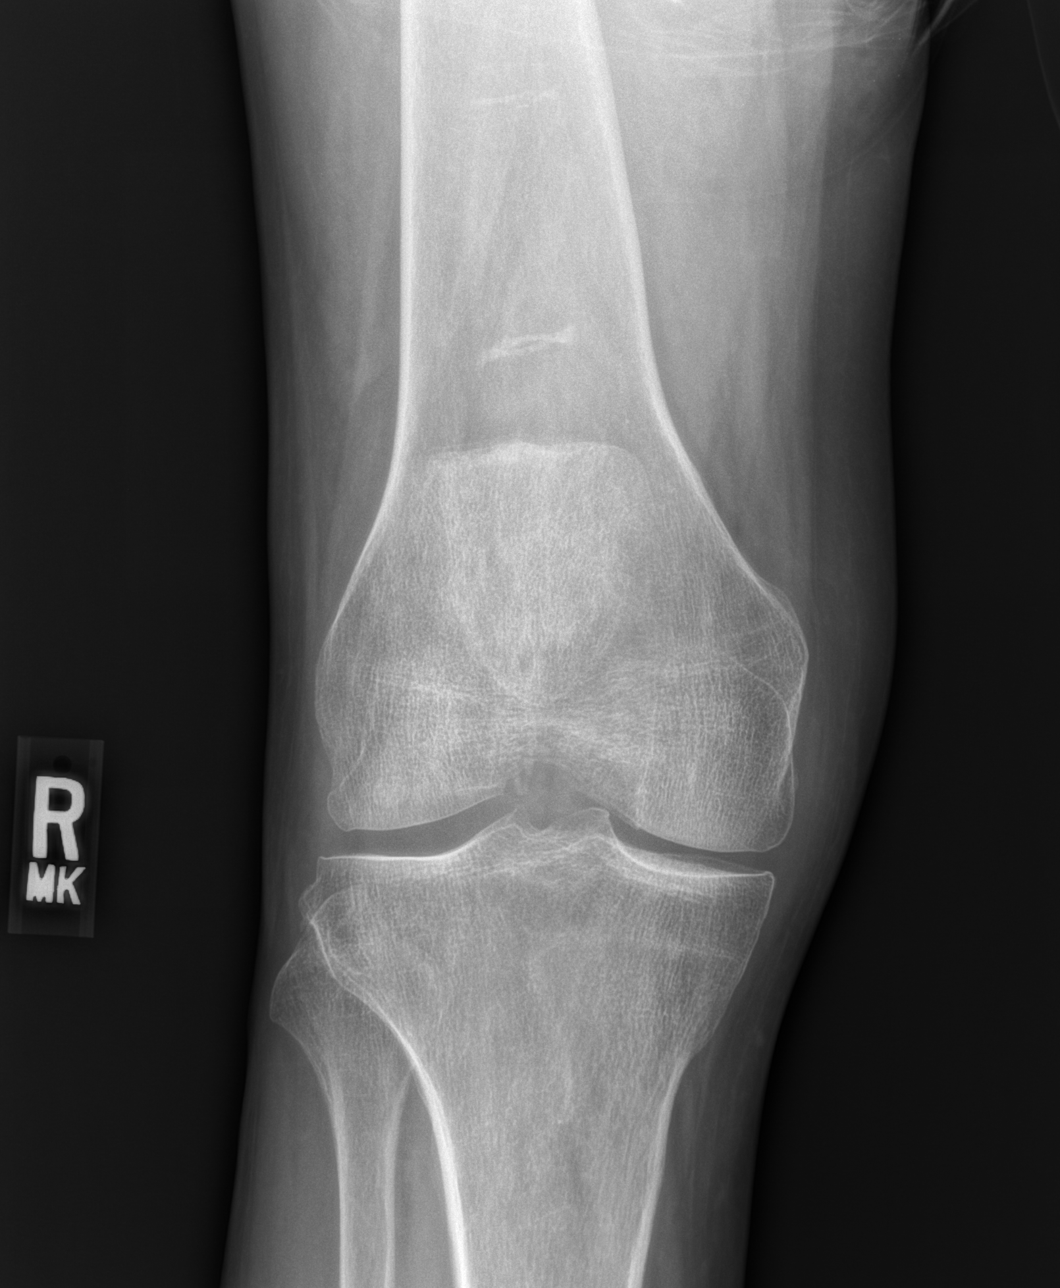

[w knee lat right (1 of 2)]
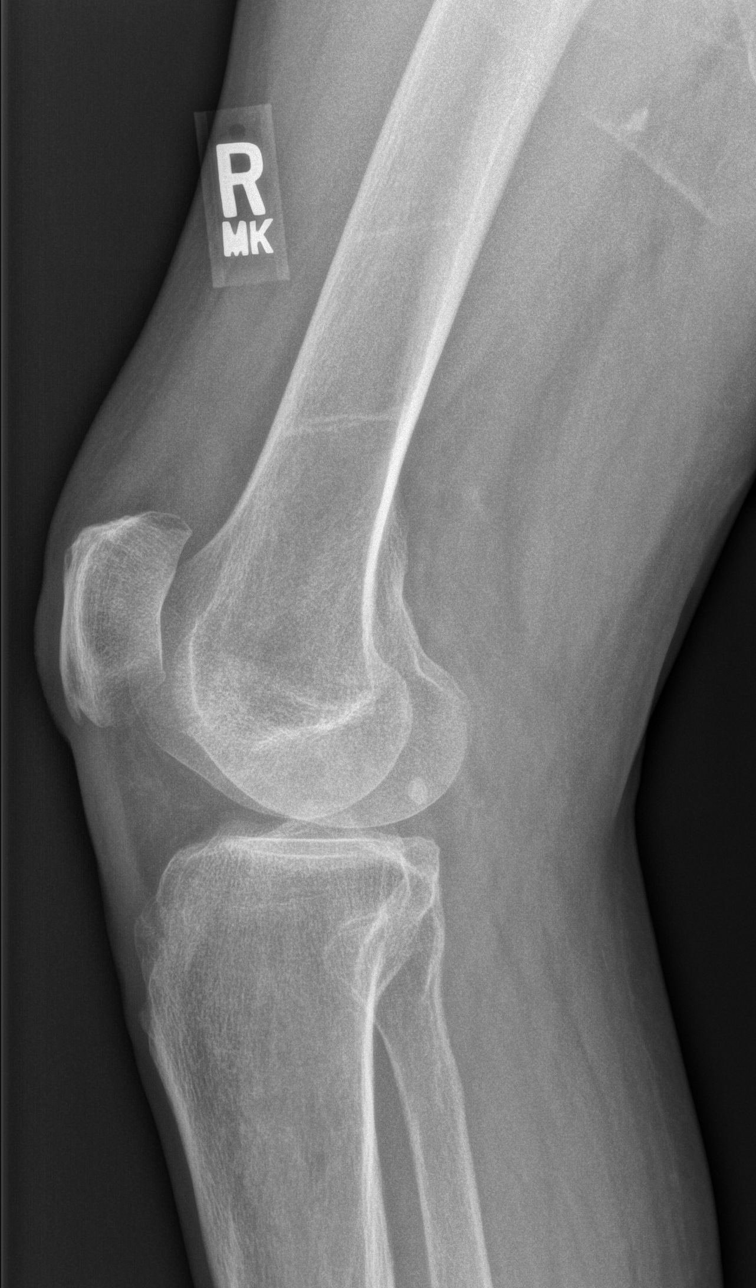

[w knee lat right (2 of 2)]
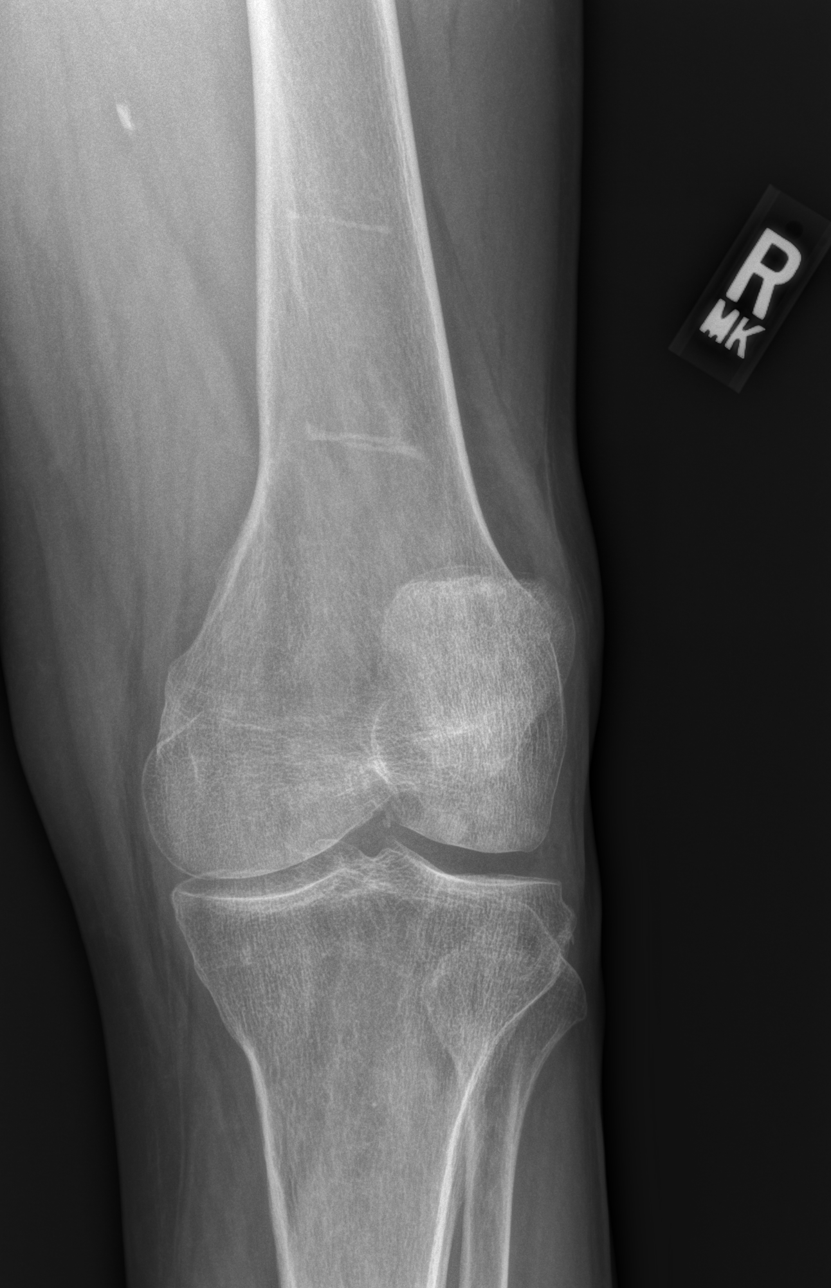

[x knee sunrise right]
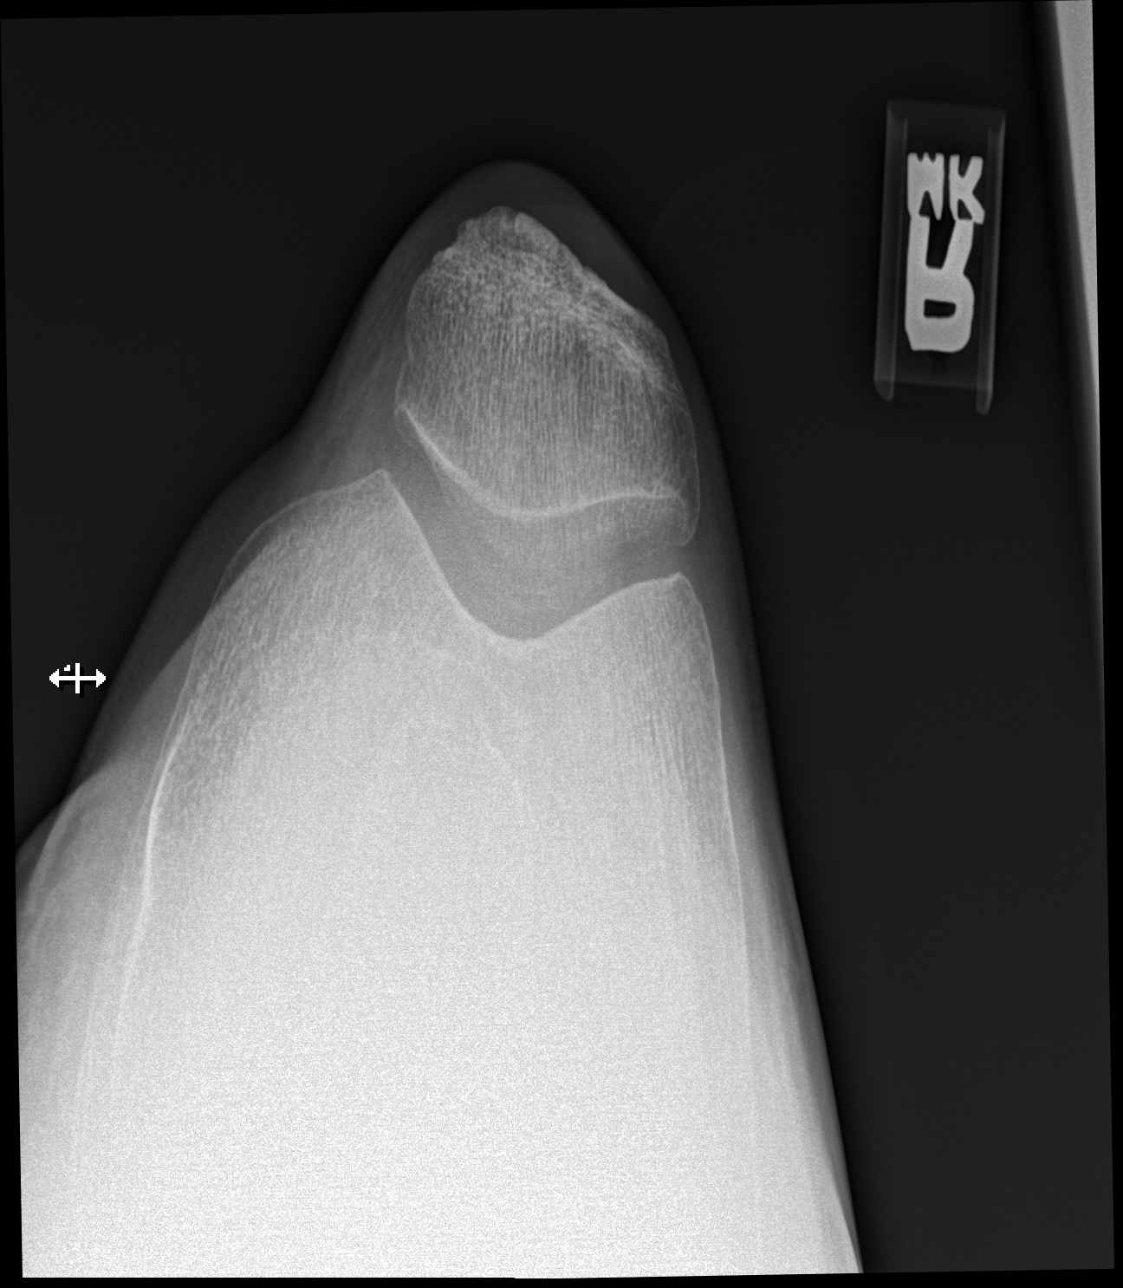

[4 of 4 positions shown; findings below may reference images not displayed]

FINDINGS: No evidence of fracture, dislocation, or joint effusion. No evidence
of arthropathy or other focal bone abnormality. Soft tissues are
unremarkable.
IMPRESSION: Negative.

## 2020-09-06 IMAGING — MR MRI CERVICAL SPINE WITHOUT CONTRAST
4 of 6 series · 26 of 48 positions shown · non-contrast
Comparison: 03/22/2018

CLINICAL DATA: Follow-up multiple sclerosis. Bilateral extremity
weakness.

EXAM:
MRI CERVICAL SPINE WITHOUT CONTRAST
TECHNIQUE: Multiplanar, multisequence MR imaging of the cervical spine was
performed. No intravenous contrast was administered.

[Series 3: T1 · sagittal · 3.0mm · 0.41mm/px · 5 of 13 slices shown (1 of 2)]
[im 1/13]
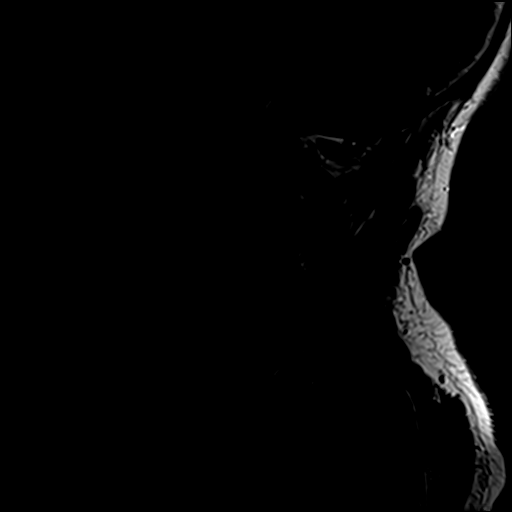
[im 4/13]
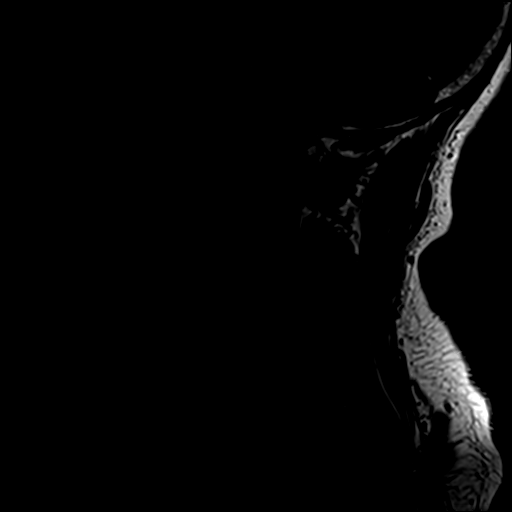
[im 7/13]
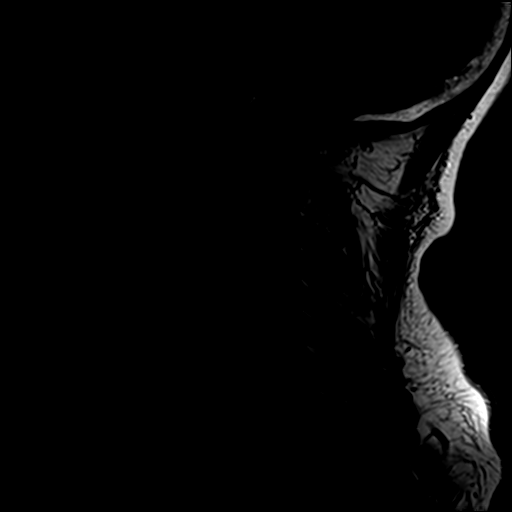
[im 10/13]
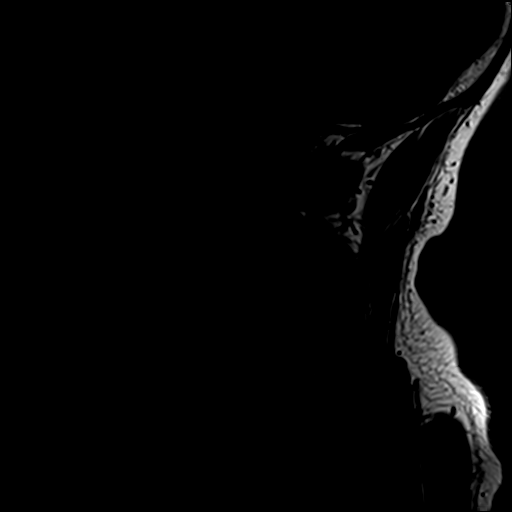
[im 13/13]
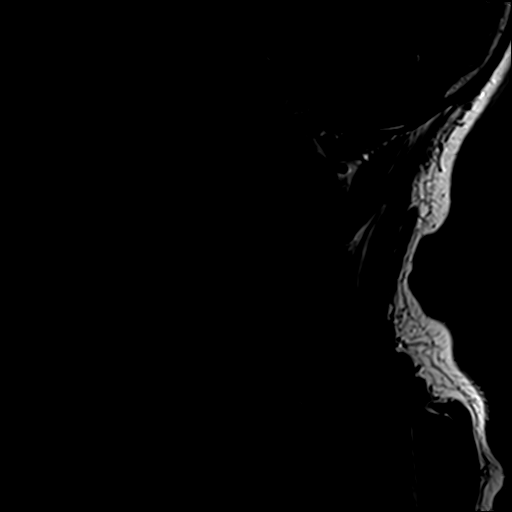

[Series 4: T2 · sagittal · 3.0mm · 0.41mm/px · 5 of 13 slices shown (1 of 2)]
[im 1/13]
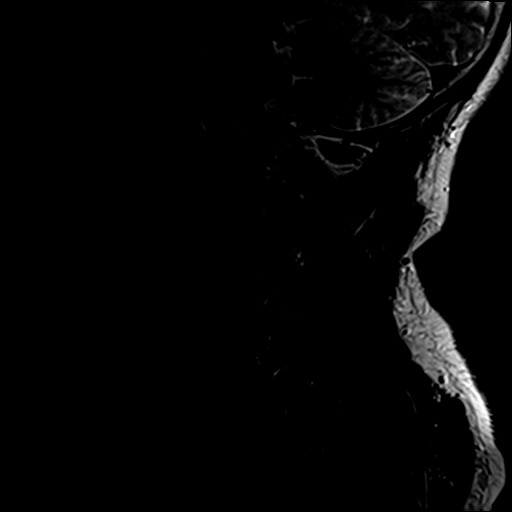
[im 4/13]
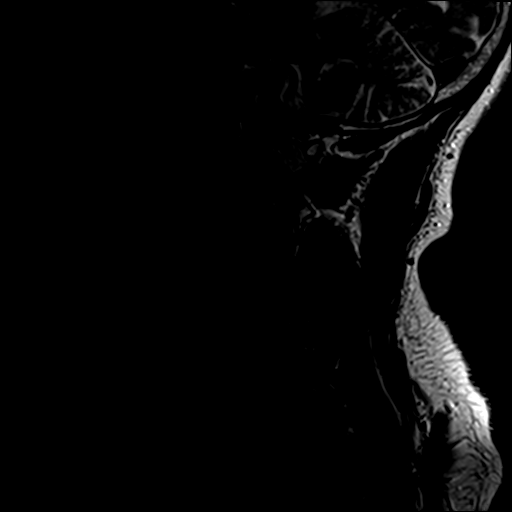
[im 7/13]
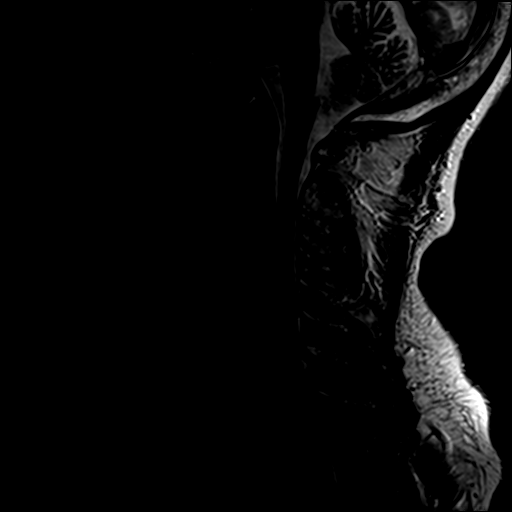
[im 10/13]
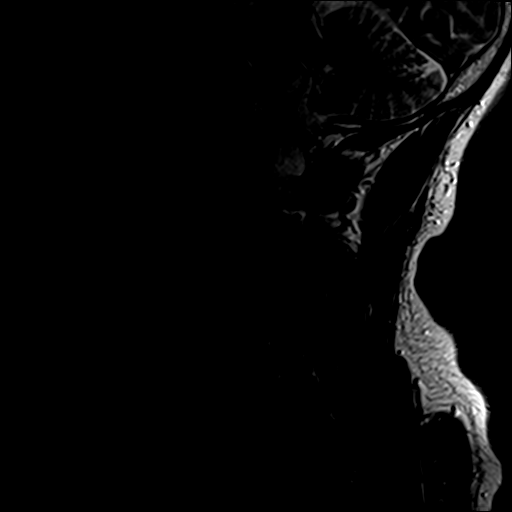
[im 13/13]
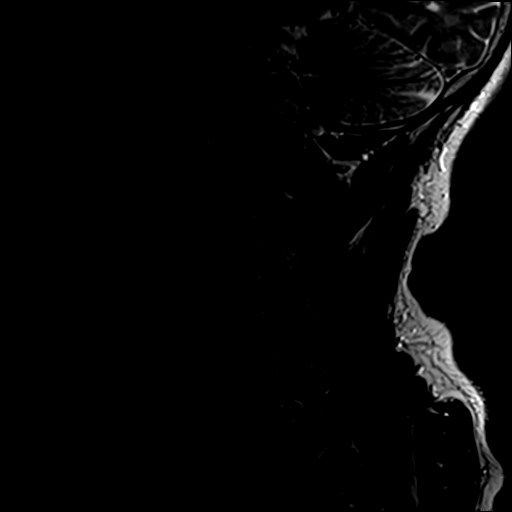

[Series 6: T2 · axial · 3.0mm · 0.70mm/px · z∈[-152,-44]mm · 11 of 30 slices shown (2 of 2)]
[im 1/30]
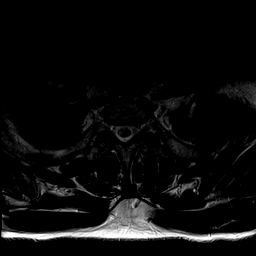
[im 3/30]
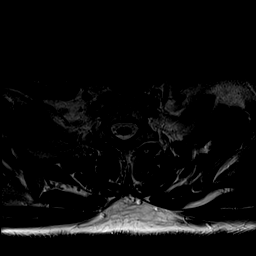
[im 6/30]
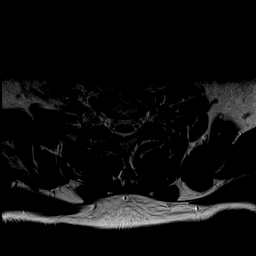
[im 9/30]
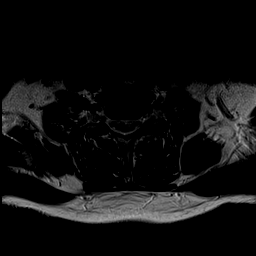
[im 12/30]
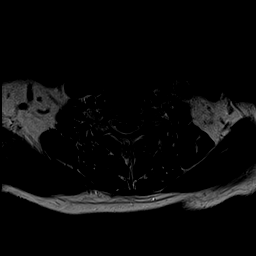
[im 15/30]
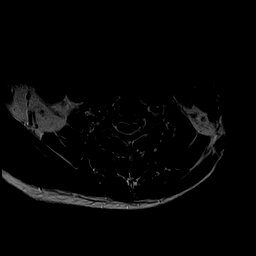
[im 18/30]
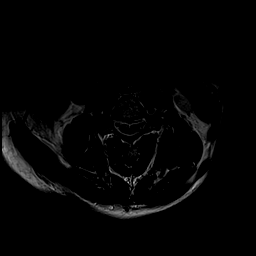
[im 21/30]
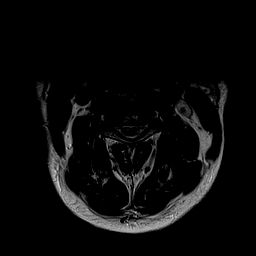
[im 24/30]
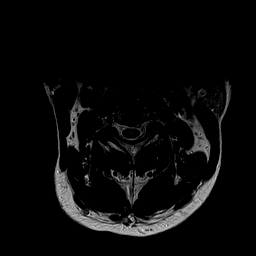
[im 27/30]
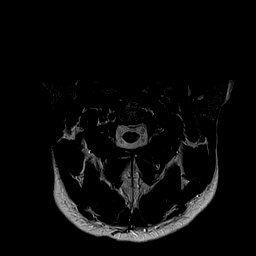
[im 30/30]
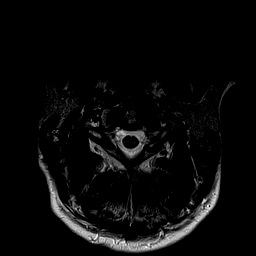

[Series 7: T1 · axial · 3.0mm · 0.35mm/px · z∈[-152,-55]mm · 5 of 30 slices shown (2 of 2)]
[im 1/30]
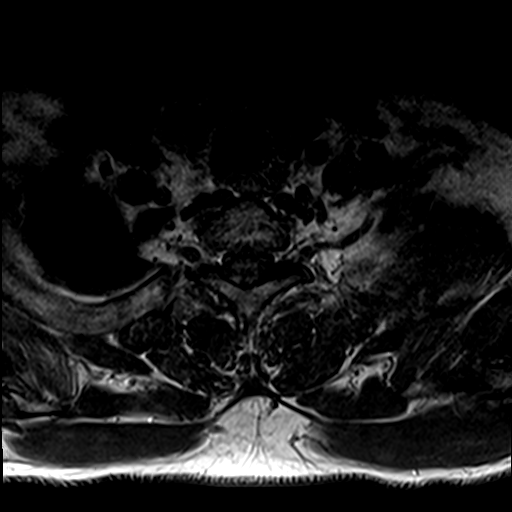
[im 3/30]
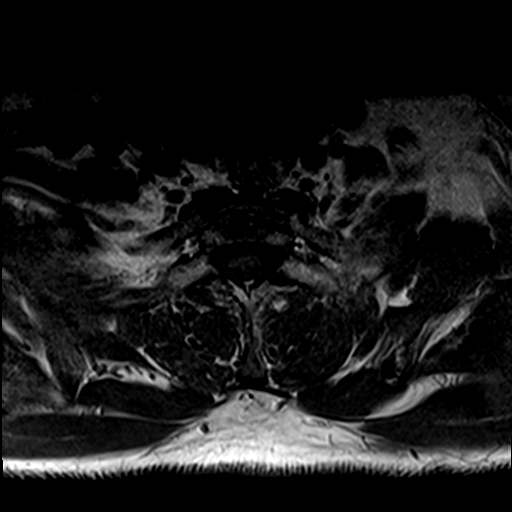
[im 6/30]
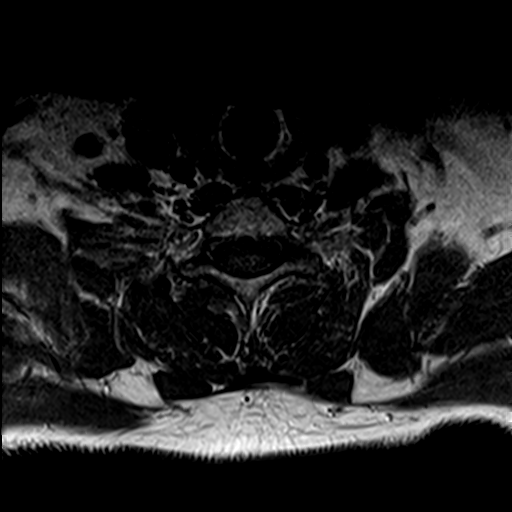
[im 15/30]
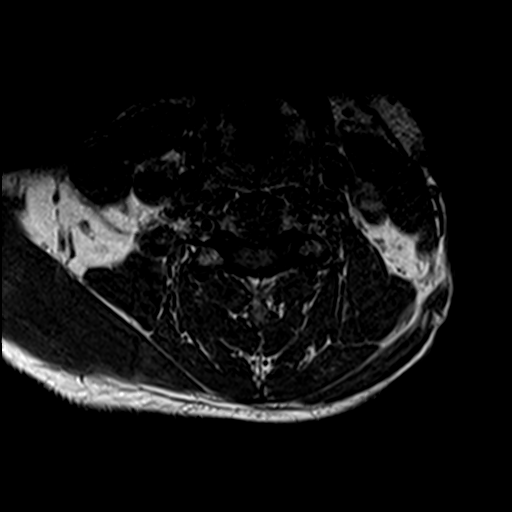
[im 27/30]
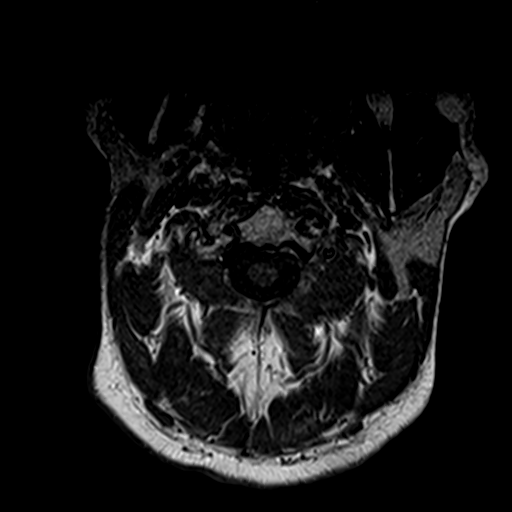

[26 of 48 positions shown; findings below may reference images not displayed]

FINDINGS: Alignment: Normal

Vertebrae: No fracture or primary bone lesion.

Cord: Widespread multifocal abnormal T2 signal throughout the
cervical and upper thoracic spine consistent with multifocal cord
involvement by demyelinating disease. The appearance is similar to
the previous study. Question if there is slight cervical disease
progression or if there is a difference in conspicuity due to
technical factors.

Posterior Fossa, vertebral arteries, paraspinal tissues: See results
of brain MRI.

Disc levels:

C2-3: Small central disc bulge. Mild facet osteoarthritis. No
stenosis.

C3-4: Bilateral uncovertebral osteophytes and disc bulge. Effacement
of the ventral subarachnoid space but no compression of the cord.
Bilateral bony foraminal stenosis.

C4-5: Disc bulge centrally. Mild facet hypertrophy. No compressive
stenosis.

C5-6: Endplate osteophytes and bulging of the disc. Narrowing of the
ventral subarachnoid space but no cord compression. Bony foraminal
narrowing on the left that could affect the C6 nerve.

C6-7: Mild bulging of the disc.  No stenosis.

C7-T1: Normal interspace.
IMPRESSION: Widespread multifocal abnormal T2 signal throughout the cervical and
upper thoracic spine consistent with multifocal cord involvement by
multiple sclerosis. Similar appearance to the previous study.
However, 1 could question if there is some degree of cervical
disease progression or if there is a difference in conspicuity due
to technical factors.

## 2020-12-07 IMAGING — CR DG KNEE COMPLETE 4+V*R*
4 series · 4 of 4 positions shown · non-contrast
Comparison: None.

CLINICAL DATA: Fall with pain.

EXAM:
RIGHT KNEE - COMPLETE 4+ VIEW

[t knee ap right]
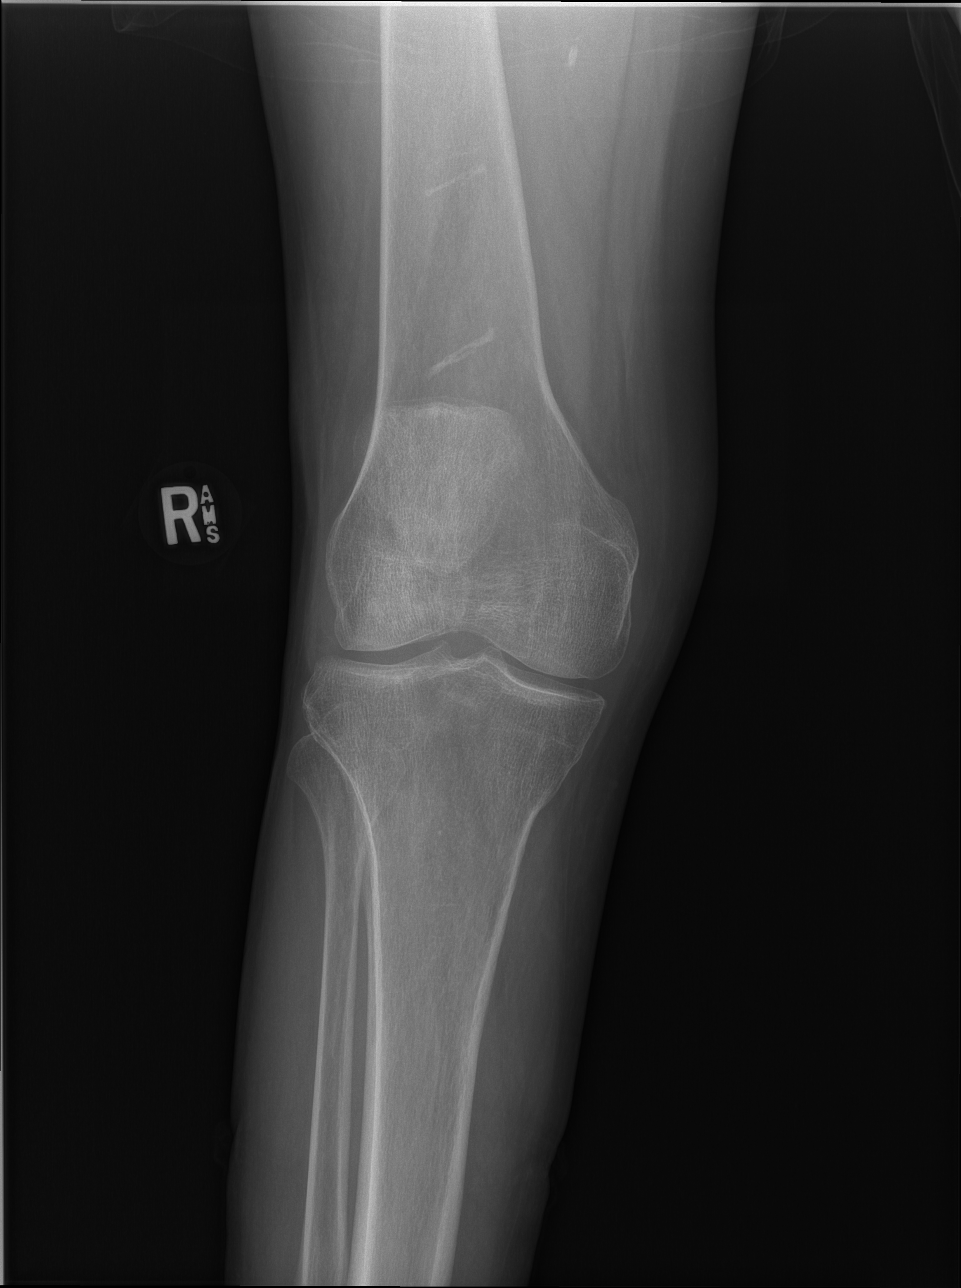

[t knee obl right (1 of 2)]
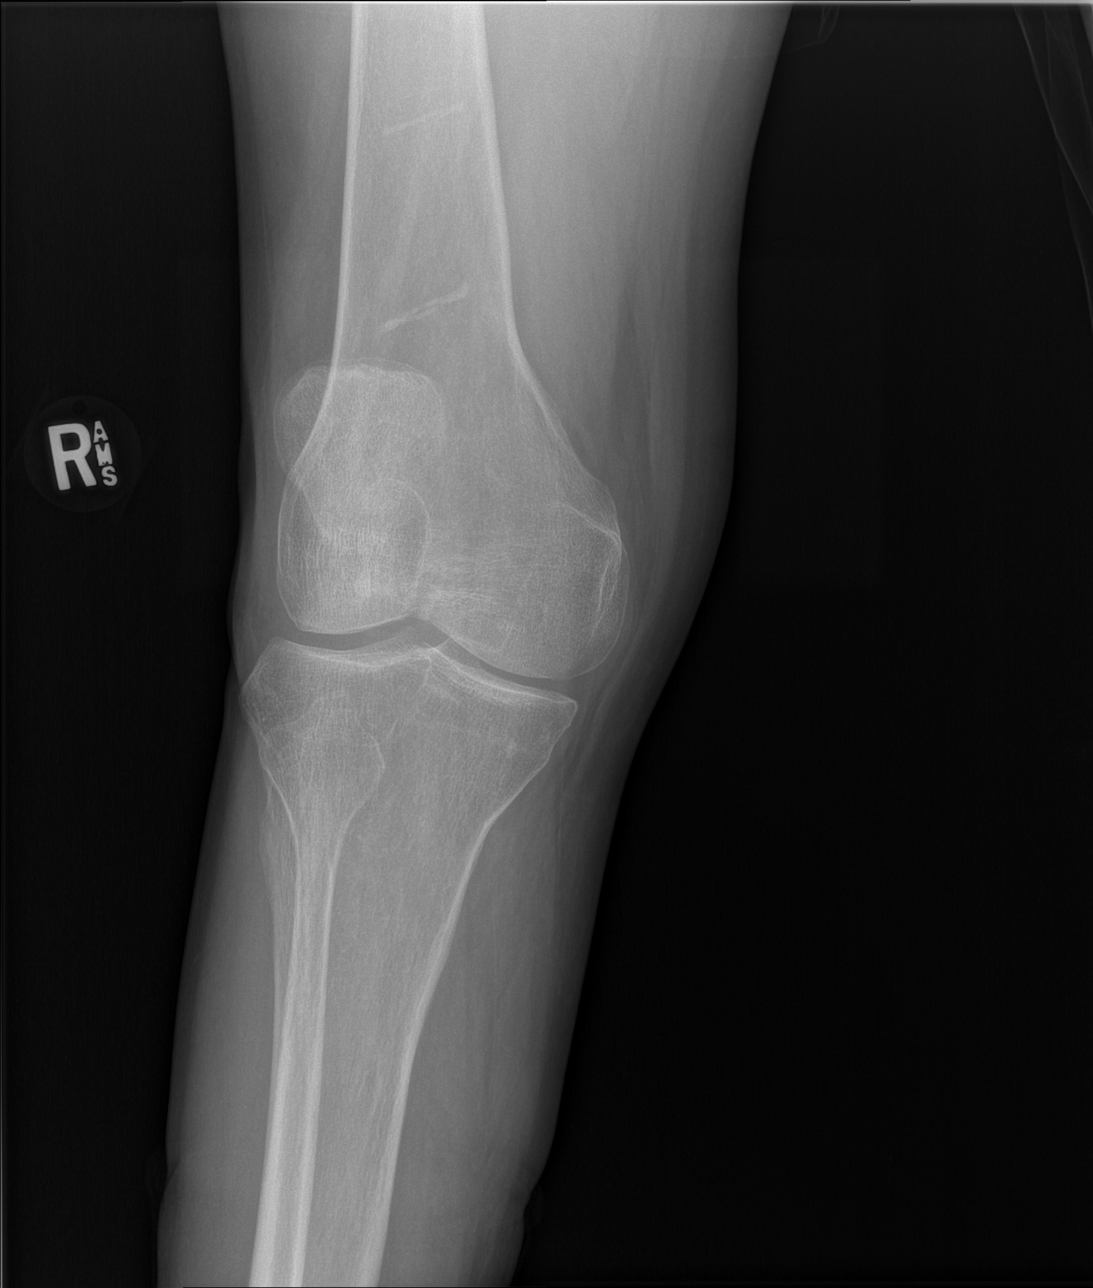

[t knee obl right (2 of 2)]
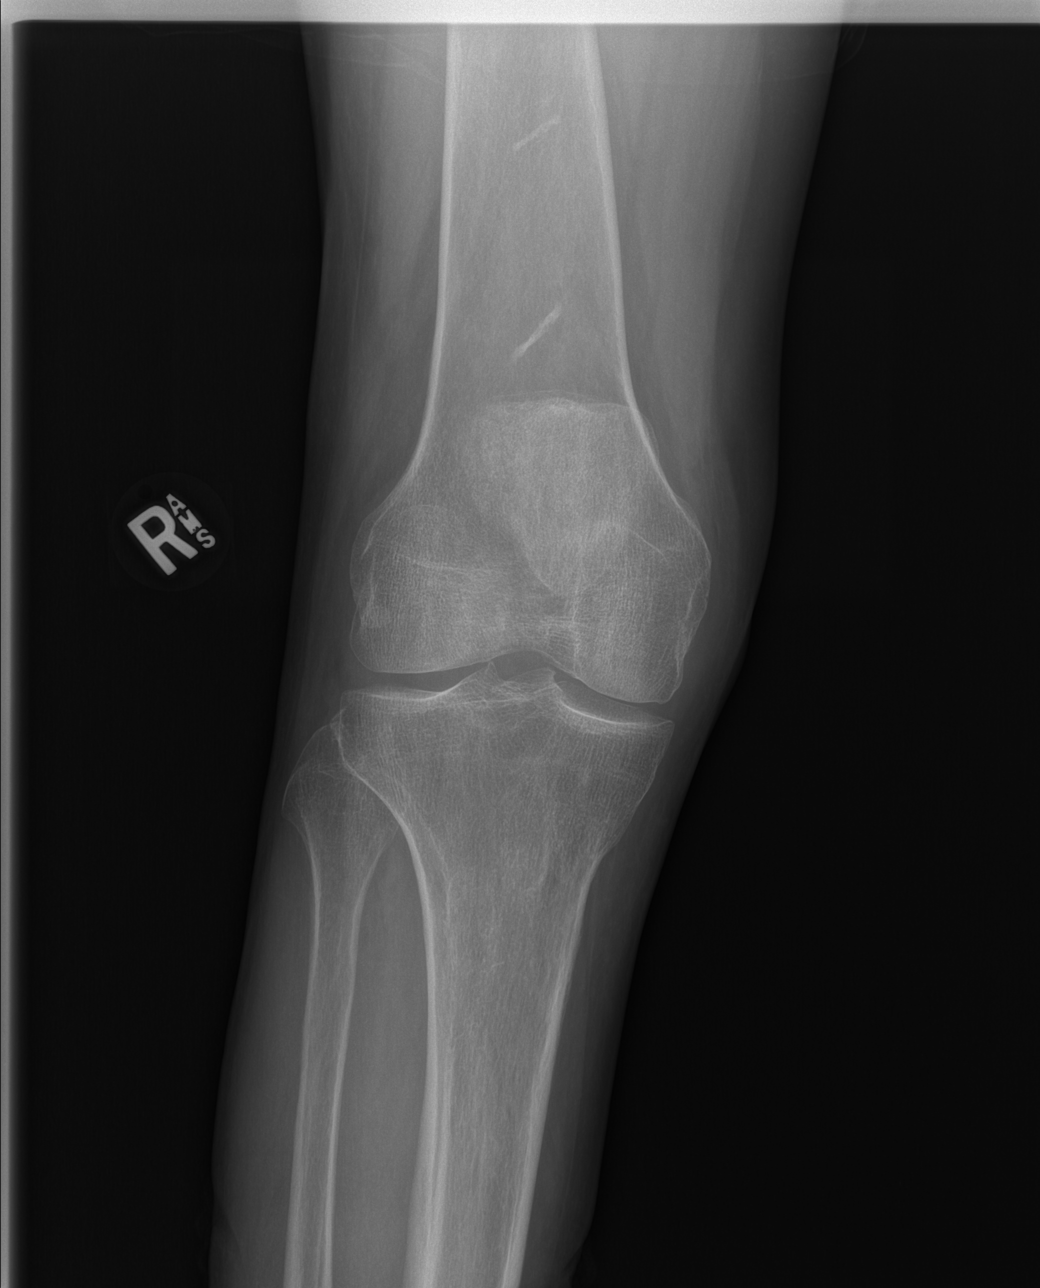

[t knee lat right]
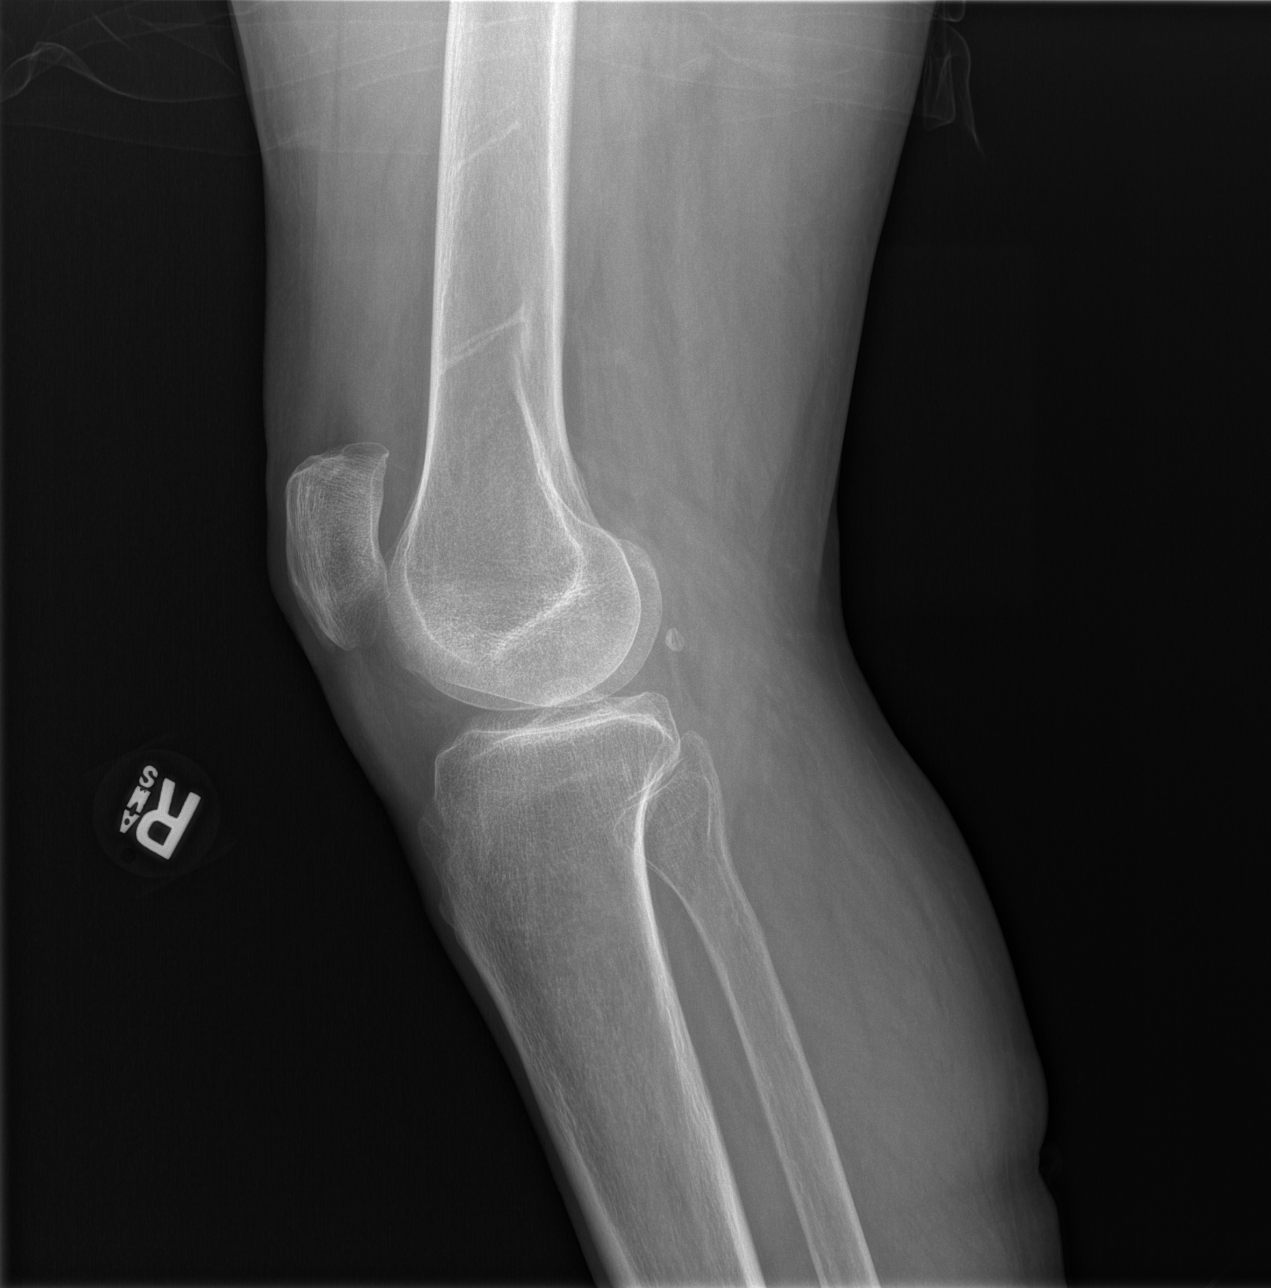

[4 of 4 positions shown; findings below may reference images not displayed]

FINDINGS: No evidence of fracture, dislocation, or joint effusion. No evidence
of arthropathy or other focal bone abnormality. Soft tissues are
unremarkable.
IMPRESSION: Negative.

## 2021-01-16 IMAGING — MR MR KNEE*R* W/O CM
7 series · 38 of 40 positions shown · non-contrast
Comparison: Right knee x-rays dated July 16, 2019.

CLINICAL DATA: Chronic right knee pain and instability. No prior
surgery.

EXAM:
MRI OF THE RIGHT KNEE WITHOUT CONTRAST
TECHNIQUE: Multiplanar, multisequence MR imaging of the knee was performed. No
intravenous contrast was administered.

[Series 6: T2 fat-sat · axial · right · 4.0mm · 0.50mm/px · z∈[-65,+88]mm · 6 of 36 slices shown (1 of 3)]
[im 1/36]
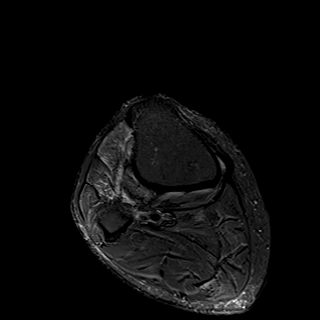
[im 8/36]
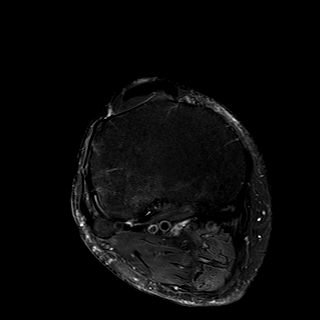
[im 15/36]
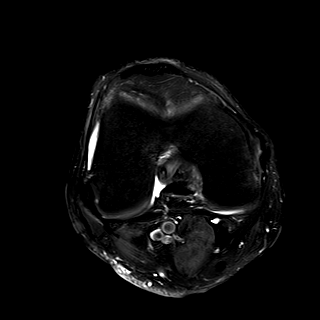
[im 22/36]
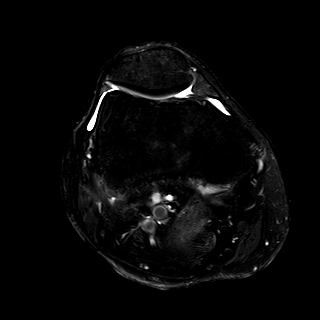
[im 29/36]
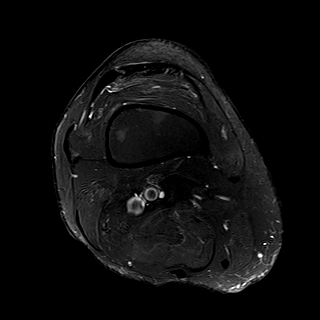
[im 36/36]
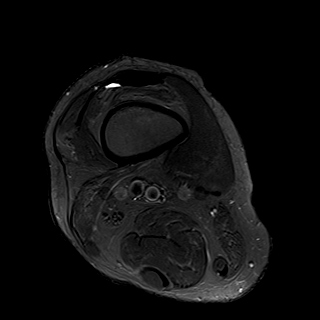

[Series 7: T2 fat-sat · coronal · right · 4.0mm · 0.39mm/px · 6 of 28 slices shown (2 of 3)]
[im 1/28]
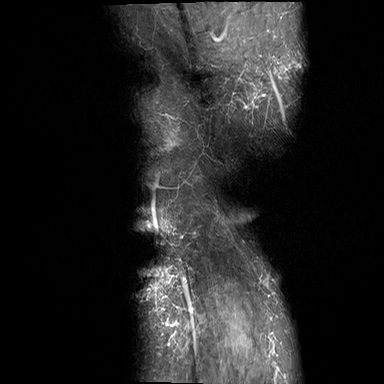
[im 6/28]
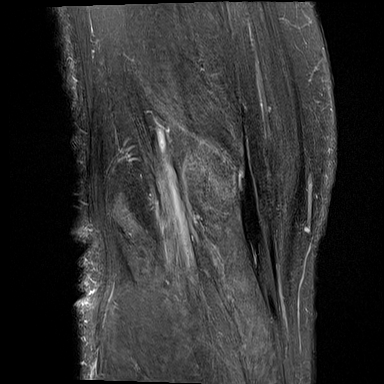
[im 11/28]
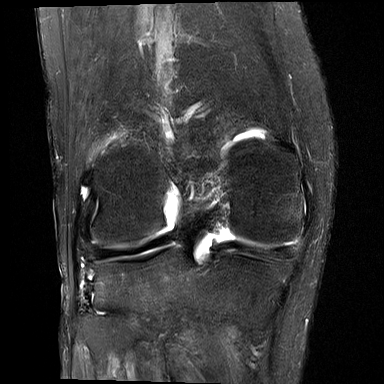
[im 17/28]
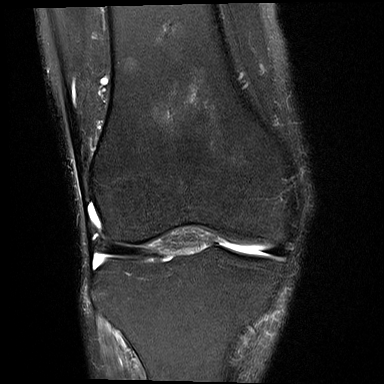
[im 22/28]
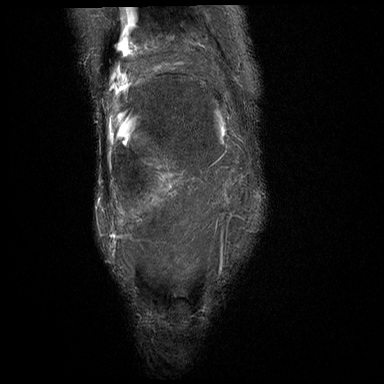
[im 28/28]
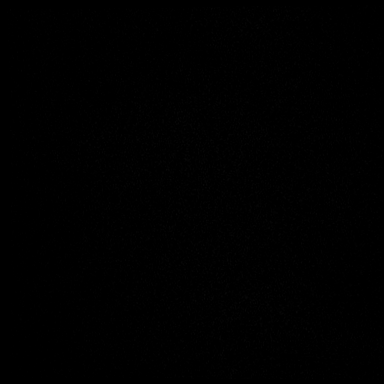

[Series 8: T1 · coronal · right · 4.0mm · 0.39mm/px · 4 of 28 slices shown]
[im 1/28]
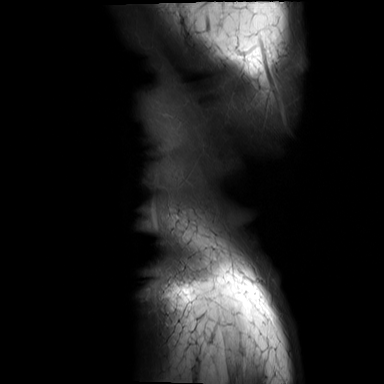
[im 6/28]
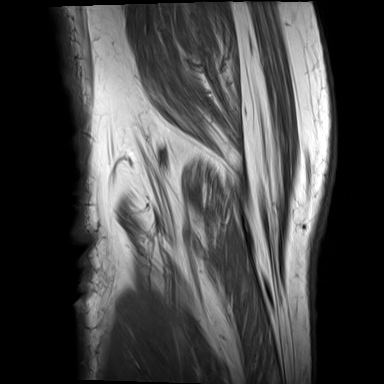
[im 11/28]
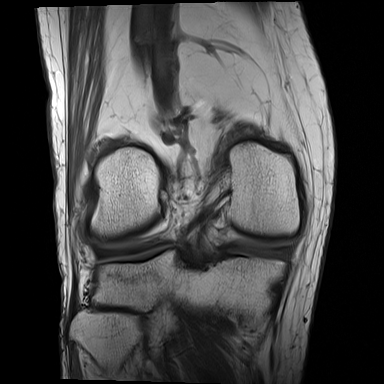
[im 17/28]
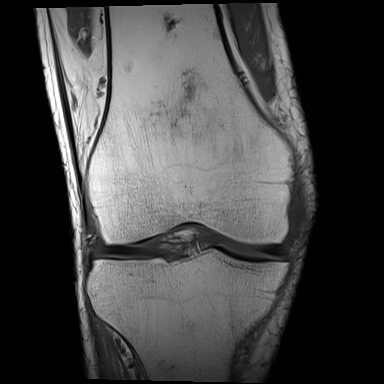

[Series 9: PD fat-sat · coronal · right · 3.0mm · 0.47mm/px · 6 of 28 slices shown (1 of 2)]
[im 1/28]
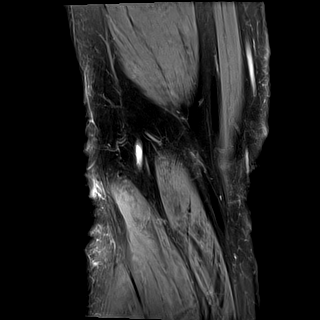
[im 6/28]
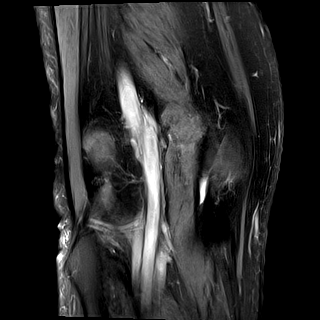
[im 11/28]
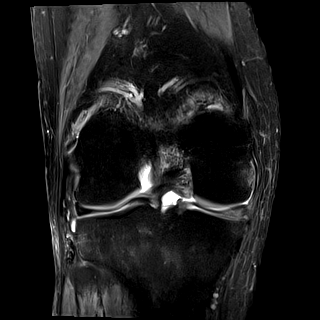
[im 17/28]
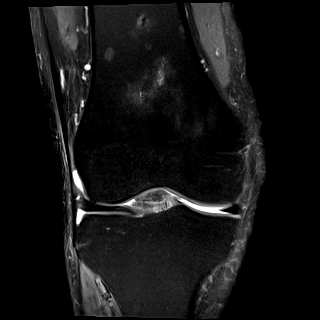
[im 22/28]
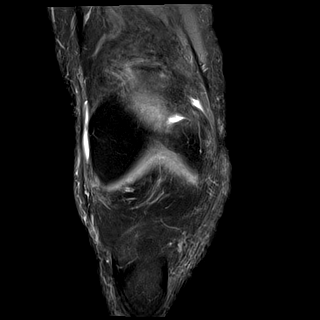
[im 28/28]
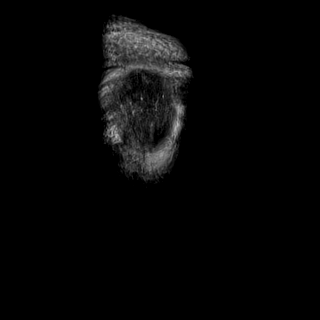

[Series 10: PD fat-sat · sagittal · right · 3.0mm · 0.39mm/px · 6 of 27 slices shown (2 of 2)]
[im 1/27]
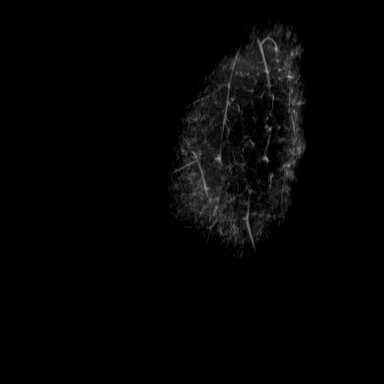
[im 6/27]
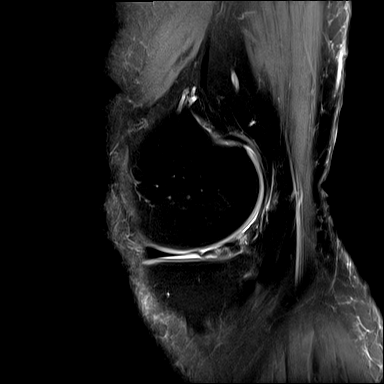
[im 11/27]
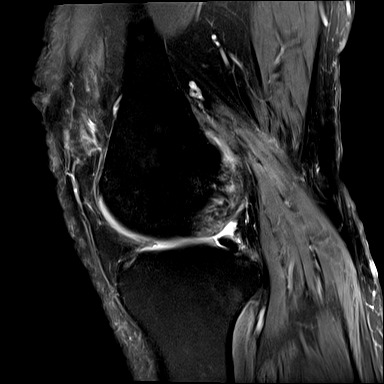
[im 16/27]
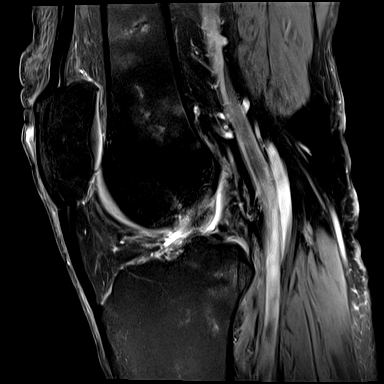
[im 21/27]
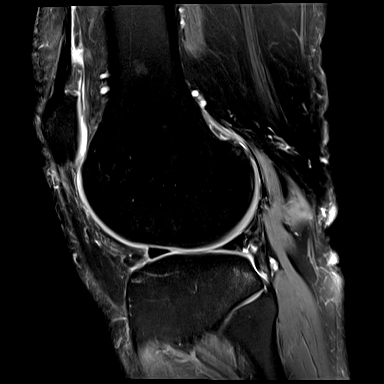
[im 27/27]
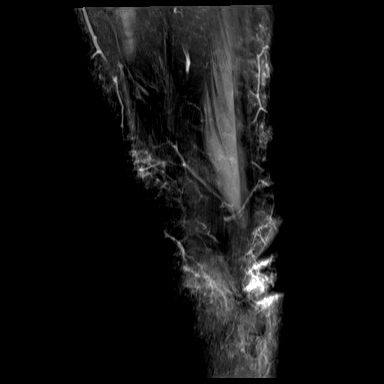

[Series 11: T2 fat-sat · sagittal · right · 3.0mm · 0.39mm/px · 6 of 27 slices shown (3 of 3)]
[im 1/27]
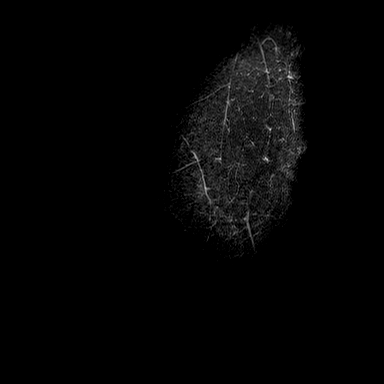
[im 6/27]
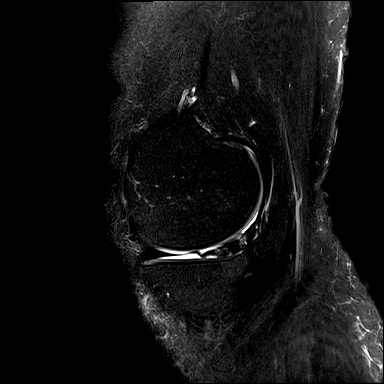
[im 11/27]
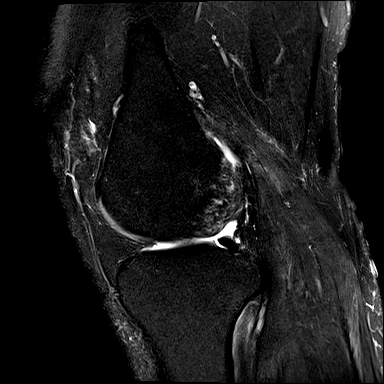
[im 16/27]
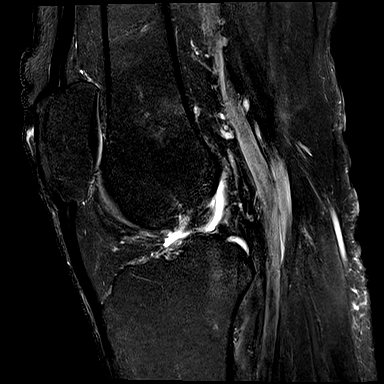
[im 21/27]
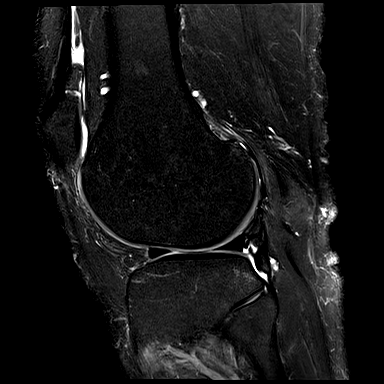
[im 27/27]
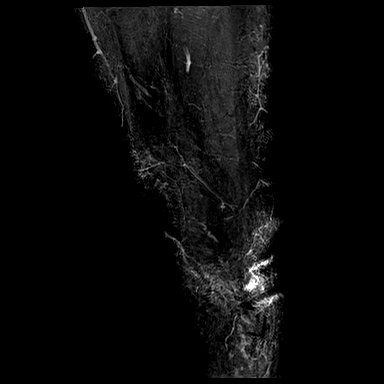

[Series 12: PD · oblique · right · 1.5mm · 0.44mm/px · 4 of 21 slices shown]
[im 1/21]
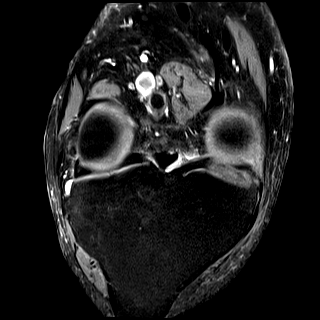
[im 7/21]
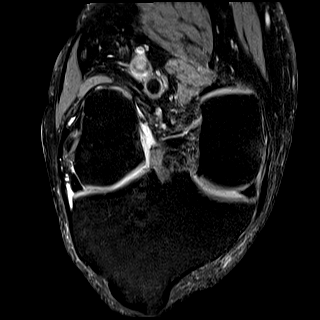
[im 14/21]
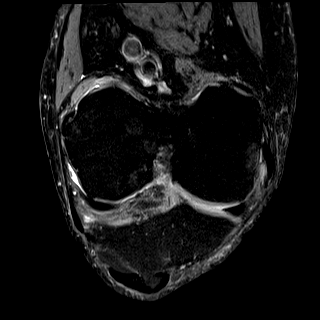
[im 21/21]
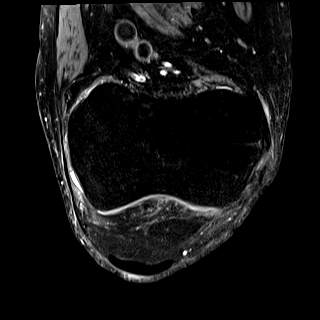

[38 of 40 positions shown; findings below may reference images not displayed]

FINDINGS: MENISCI

Medial meniscus: Complex tear of posterior horn extending to the
body junction with longitudinal and horizontal components.

Lateral meniscus:  Small radial tear of the body.

LIGAMENTS

Cruciates:  Intact ACL and PCL.

Collaterals: Medial collateral ligament is intact. Lateral
collateral ligament complex is intact.

CARTILAGE

Patellofemoral:  No chondral defect.

Medial:  No chondral defect.

Lateral:  No chondral defect.

Joint: Trace joint effusion. Normal Hoffa's fat. No plical
thickening.

Popliteal Fossa:  No Baker cyst. Intact popliteus tendon.

Extensor Mechanism: Intact quadriceps tendon and patellar tendon.
Mild proximal patellar tendinosis. Intact medial and lateral
patellar retinaculum. Intact MPFL.

Bones:  No suspicious bone lesion.  No fracture or dislocation.

Other: None.
IMPRESSION: 1. Complex tear of the medial meniscus posterior horn extending to
the body junction.
2. Small radial tear of the lateral meniscus body.

## 2021-08-30 IMAGING — RF DG FEMUR 2+V*L*
1 series · 6 of 6 positions shown · non-contrast
Comparison: September 13, 2019

CLINICAL DATA: Fracture

EXAM:
LEFT FEMUR 2 VIEWS

[Series 1: run · 6 of 6 slices shown]
[im 1/6]
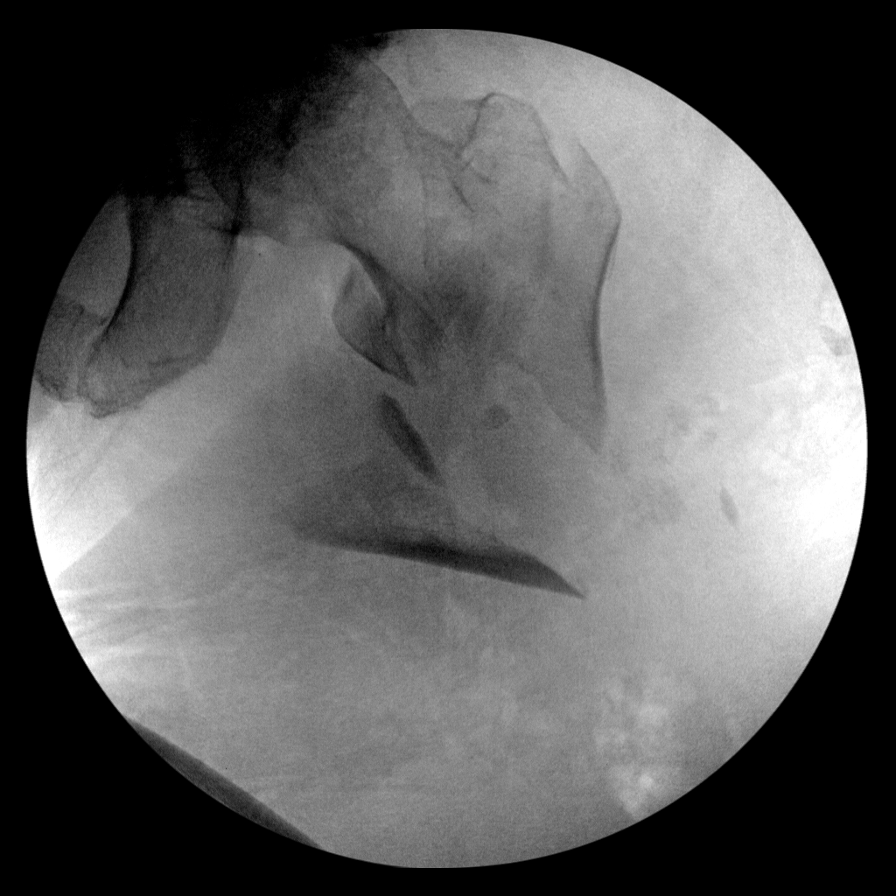
[im 2/6]
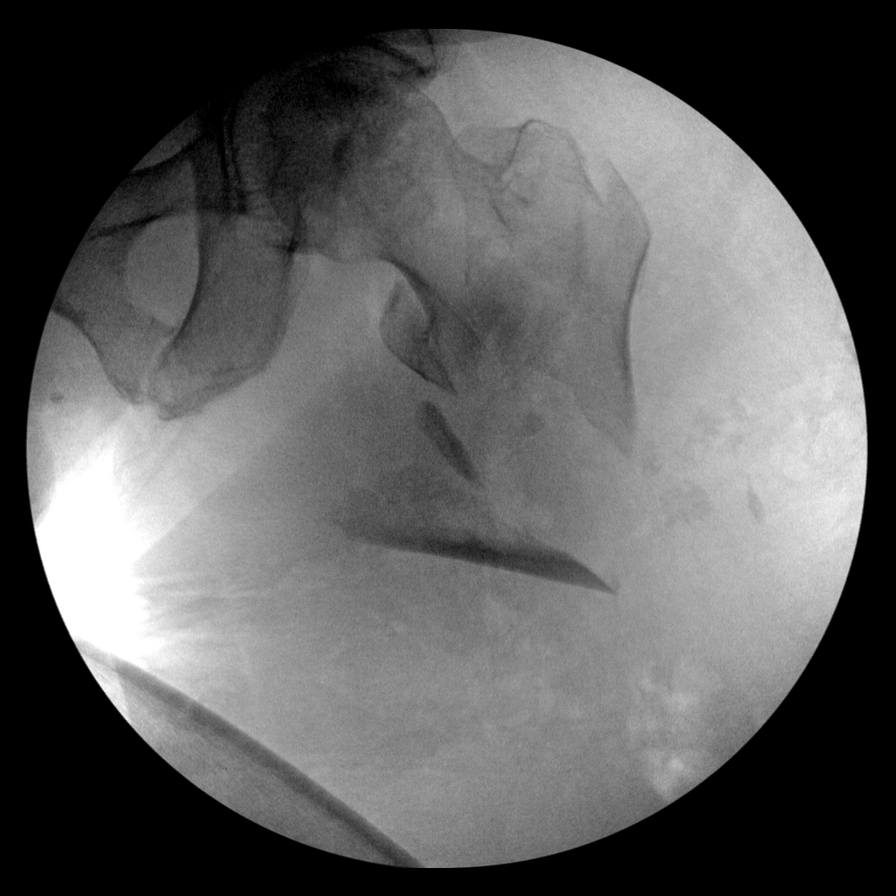
[im 3/6]
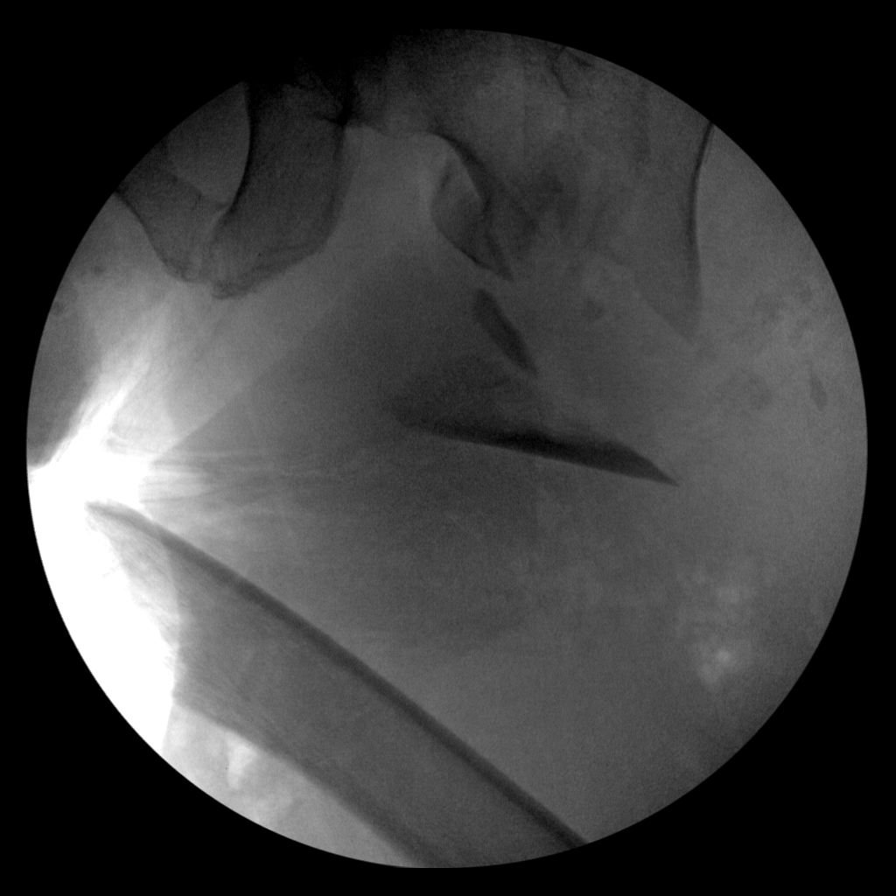
[im 4/6]
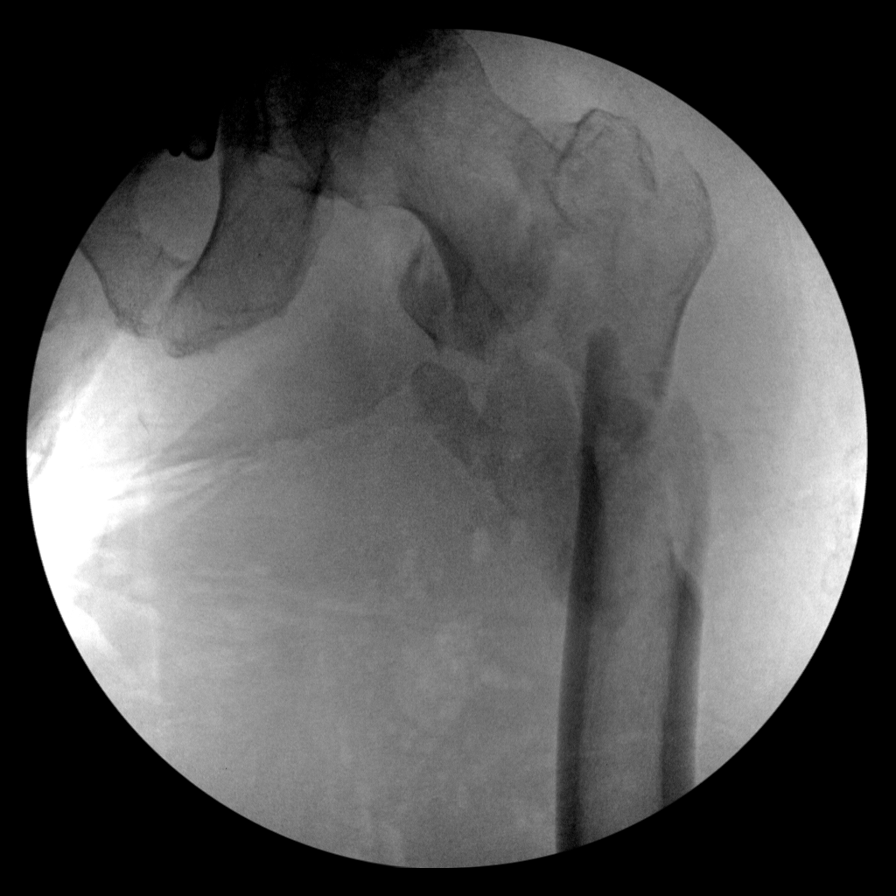
[im 5/6]
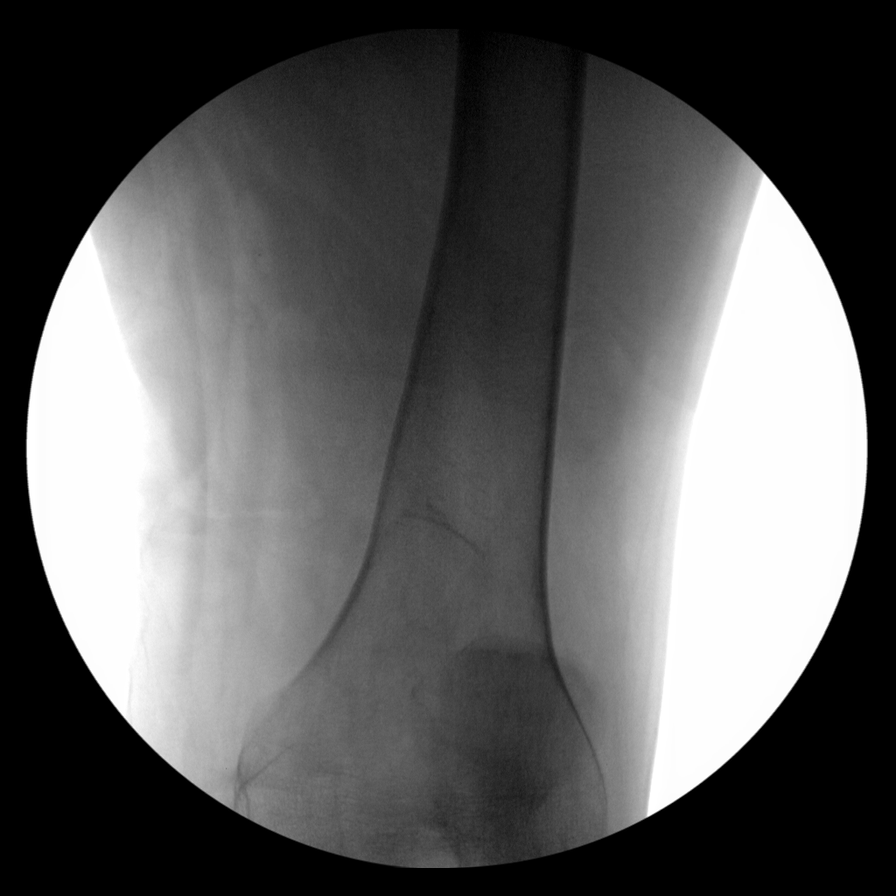
[im 6/6]
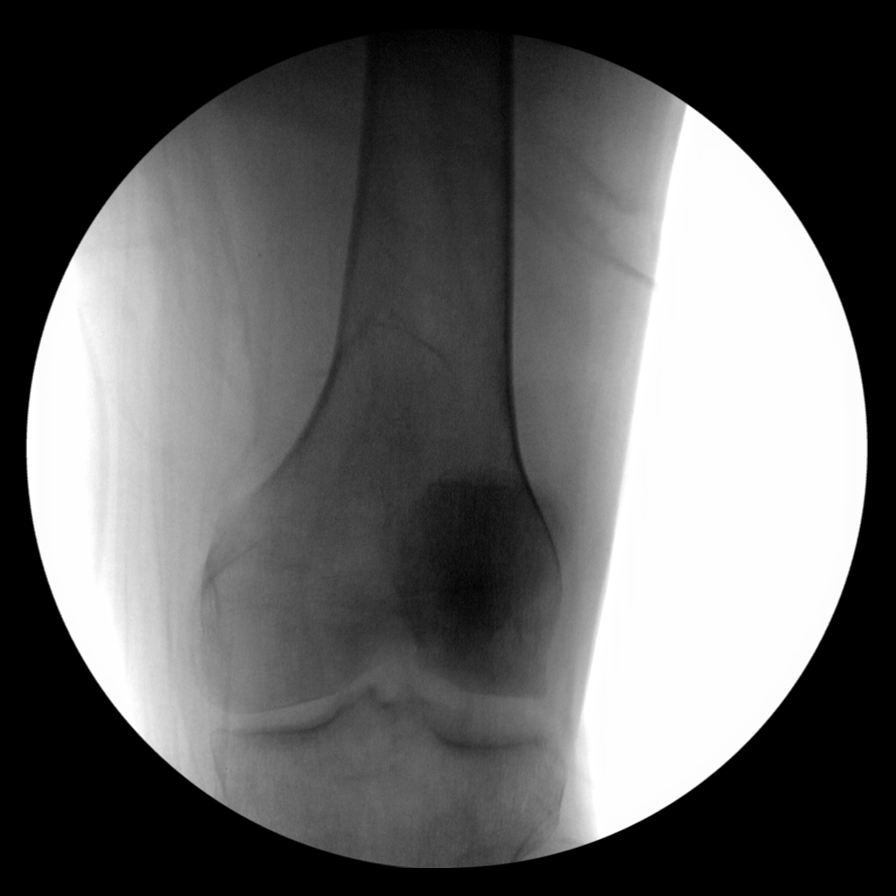

[6 of 6 positions shown; findings below may reference images not displayed]

FINDINGS: Again noted is a highly comminuted fracture of the proximal left
femur. Status post reduction, the alignment is improved.
IMPRESSION: Improved alignment status post reduction.
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
## 2021-08-30 IMAGING — RF DG FEMUR 2+V*R*
1 series · 3 of 3 positions shown · non-contrast
Comparison: September 13, 2019

CLINICAL DATA: Fracture

EXAM:
RIGHT FEMUR 2 VIEWS

[Series 1: run · 3 of 3 slices shown]
[im 1/3]
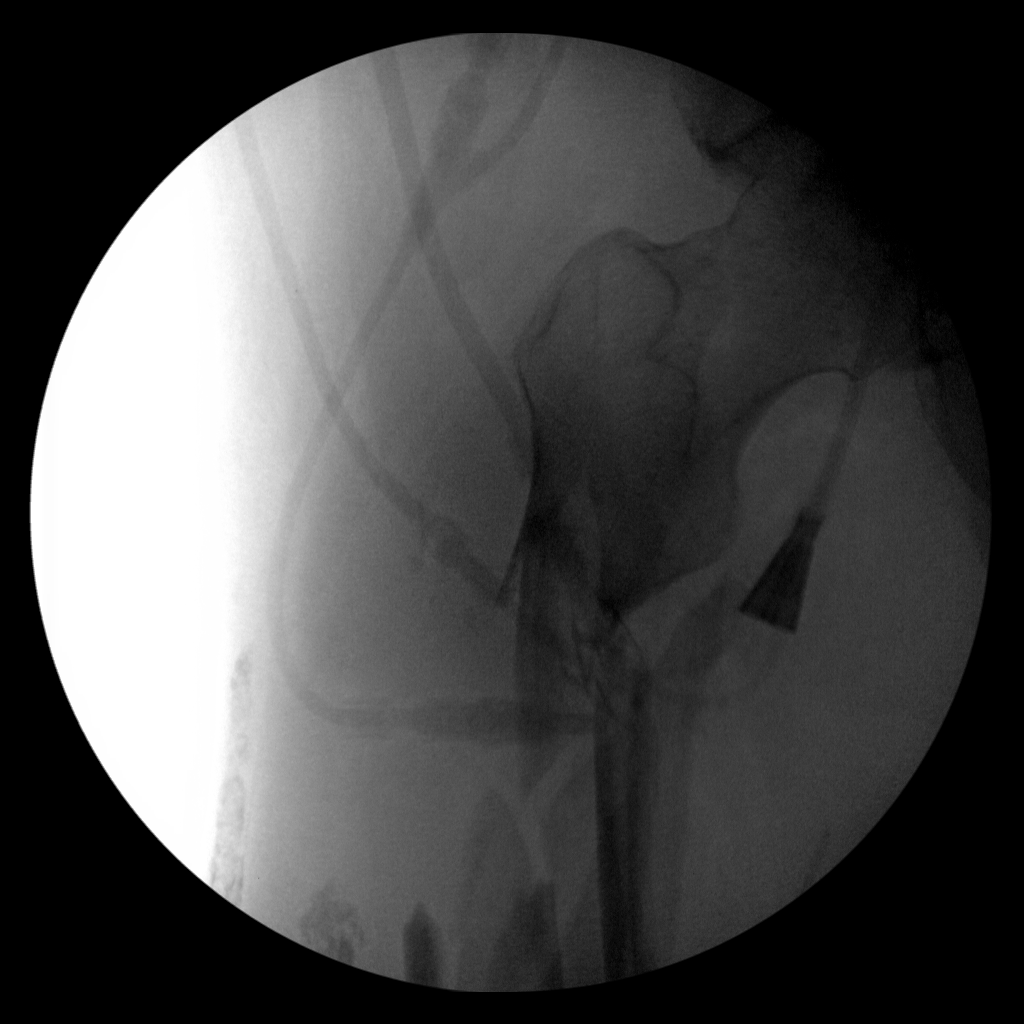
[im 2/3]
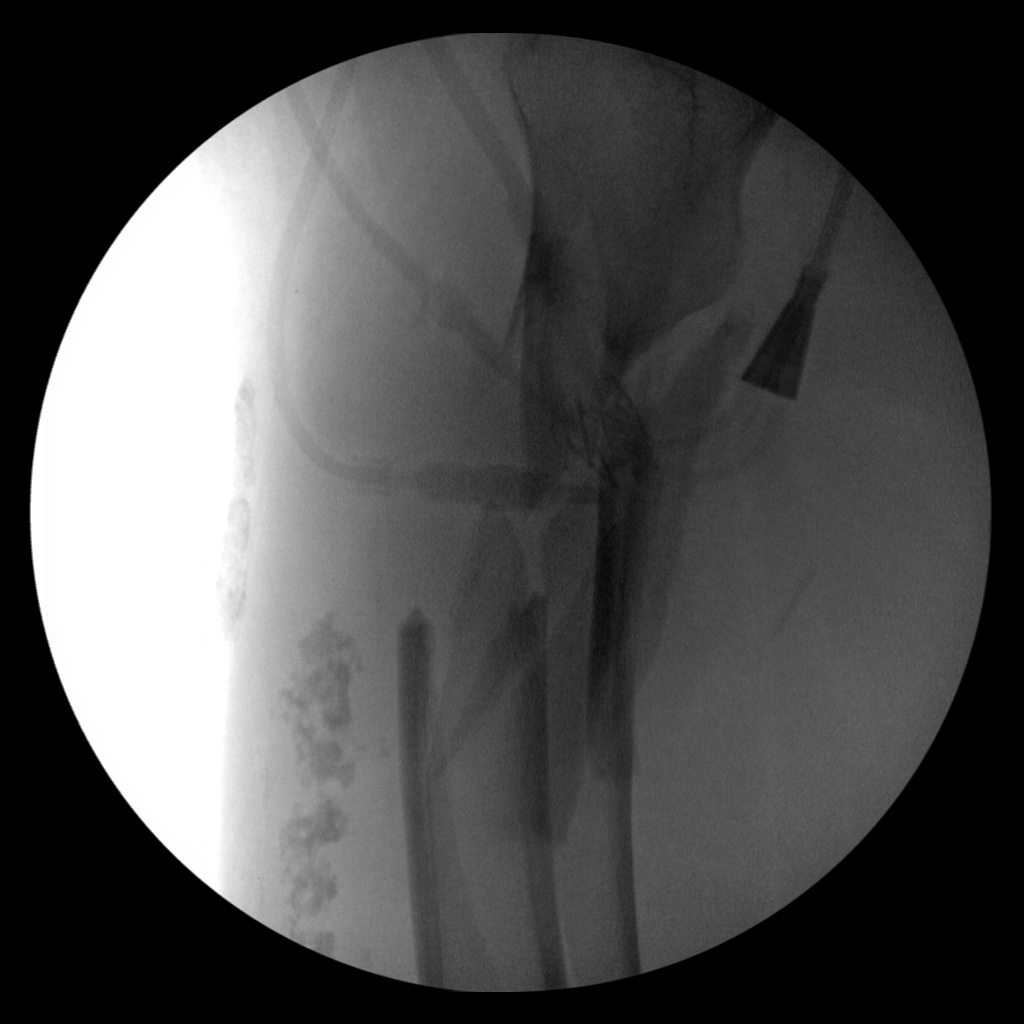
[im 3/3]
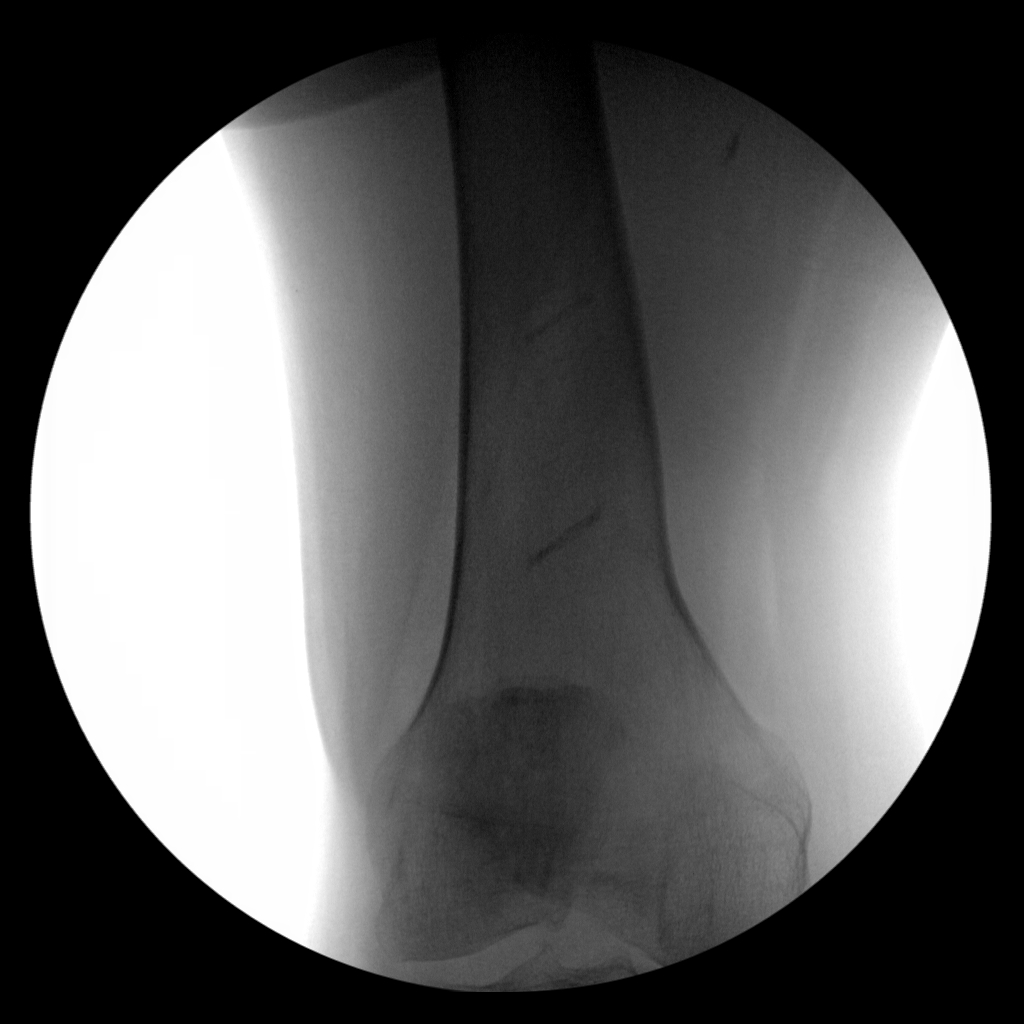

[3 of 3 positions shown; findings below may reference images not displayed]

FINDINGS: Again noted is a highly comminuted fracture of the proximal right
femur. The alignment is somewhat improved from prior study.
IMPRESSION: Slight improvement in alignment of the highly comminuted proximal
right femur fracture.
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
## 2021-08-30 IMAGING — RF DG TIBIA/FIBULA 2V*L*
1 series · 6 of 6 positions shown · non-contrast
Comparison: September 13, 2019

CLINICAL DATA: Fracture

EXAM:
LEFT TIBIA AND FIBULA - 2 VIEW

[Series 1: run · 6 of 6 slices shown]
[im 1/6]
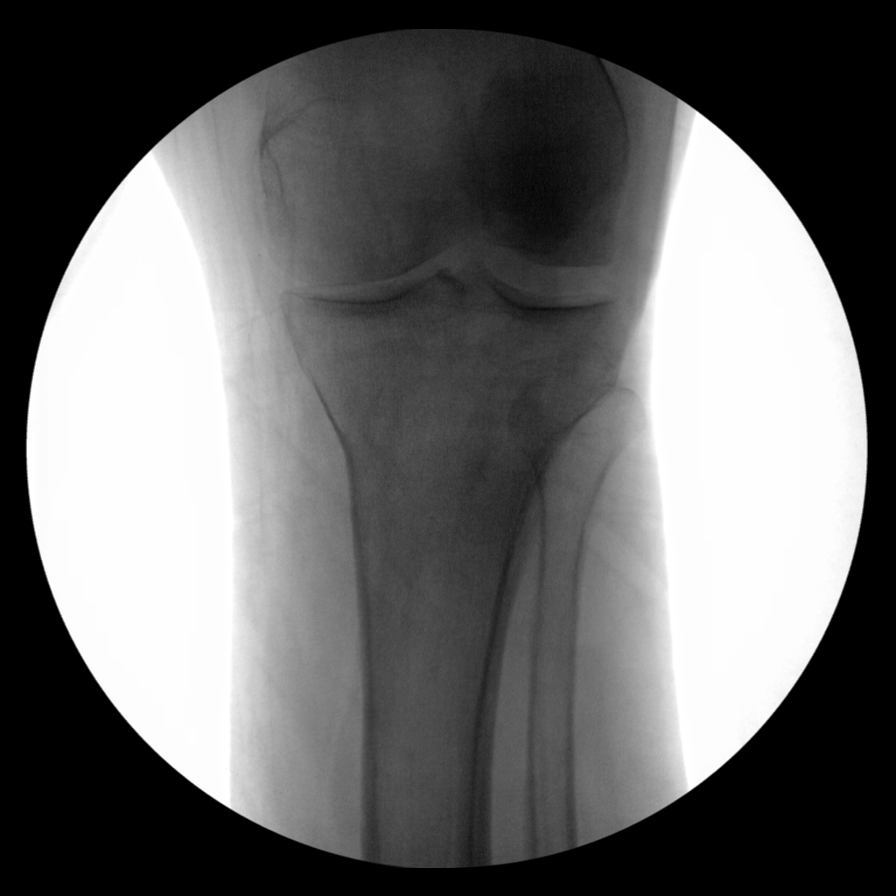
[im 2/6]
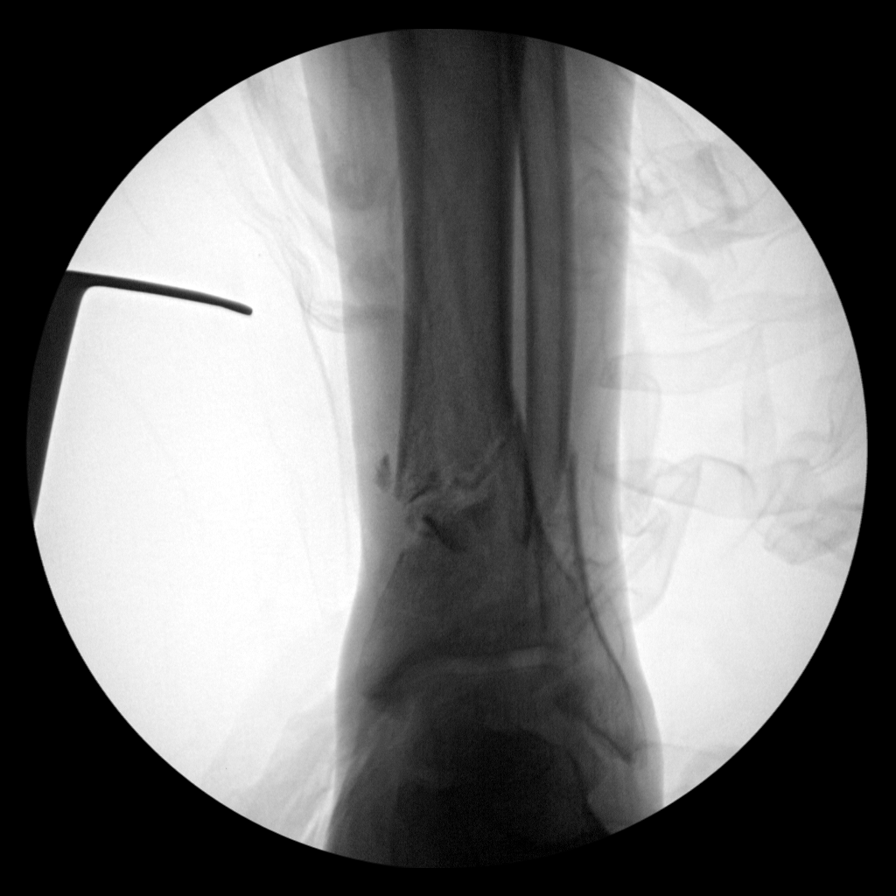
[im 3/6]
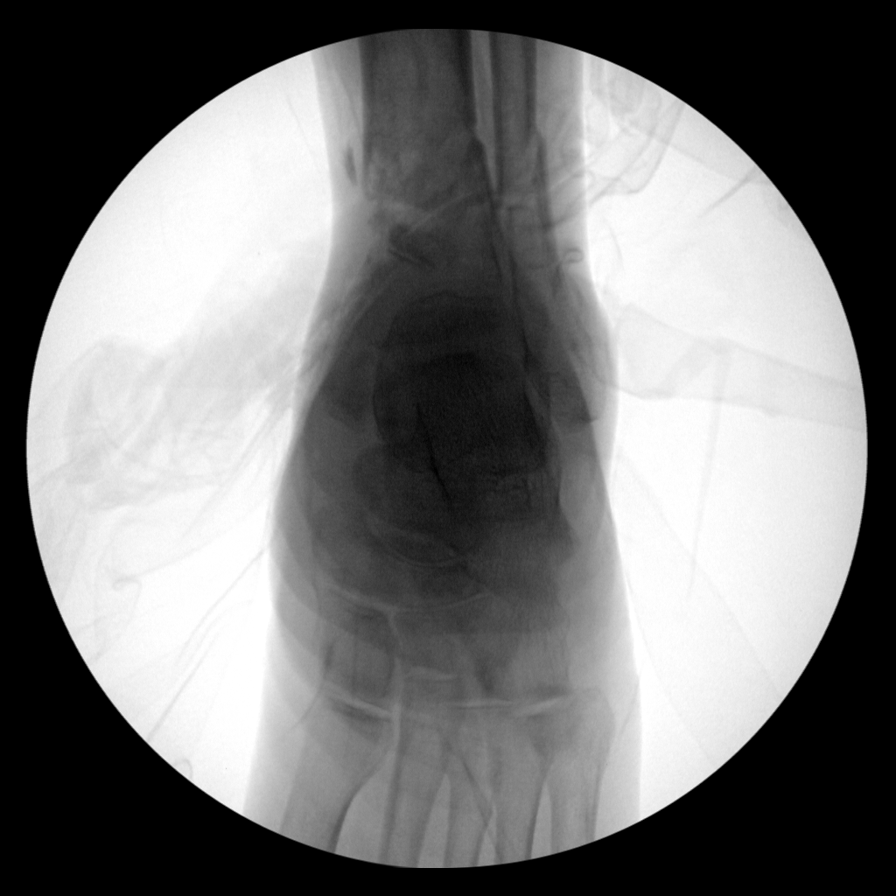
[im 4/6]
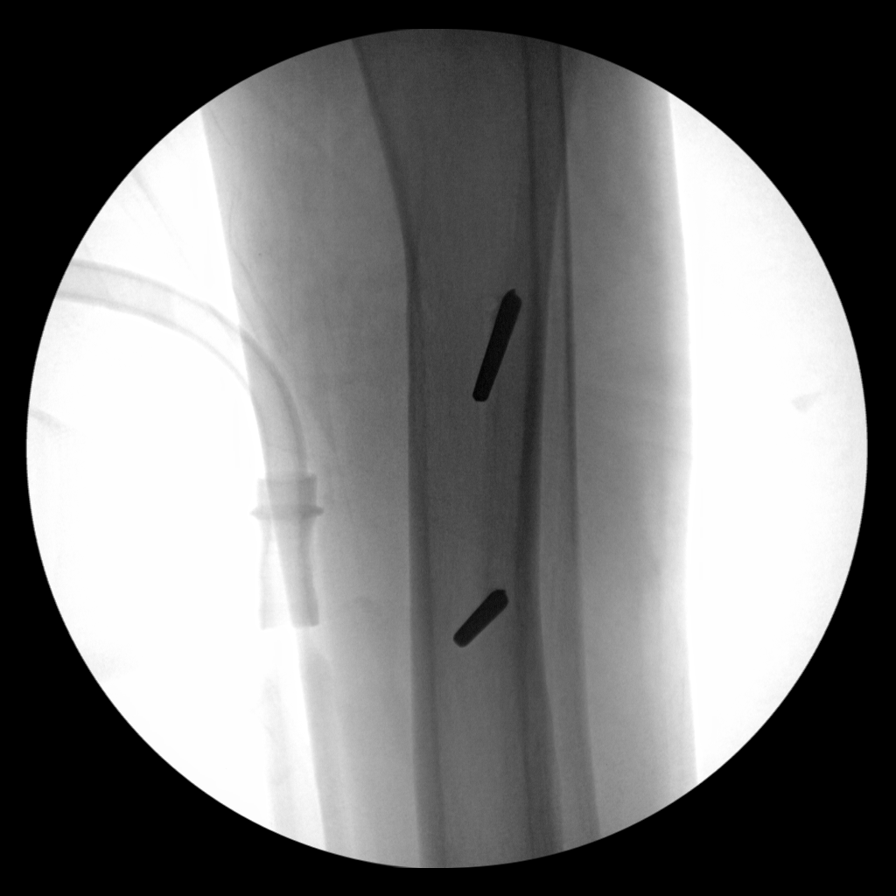
[im 5/6]
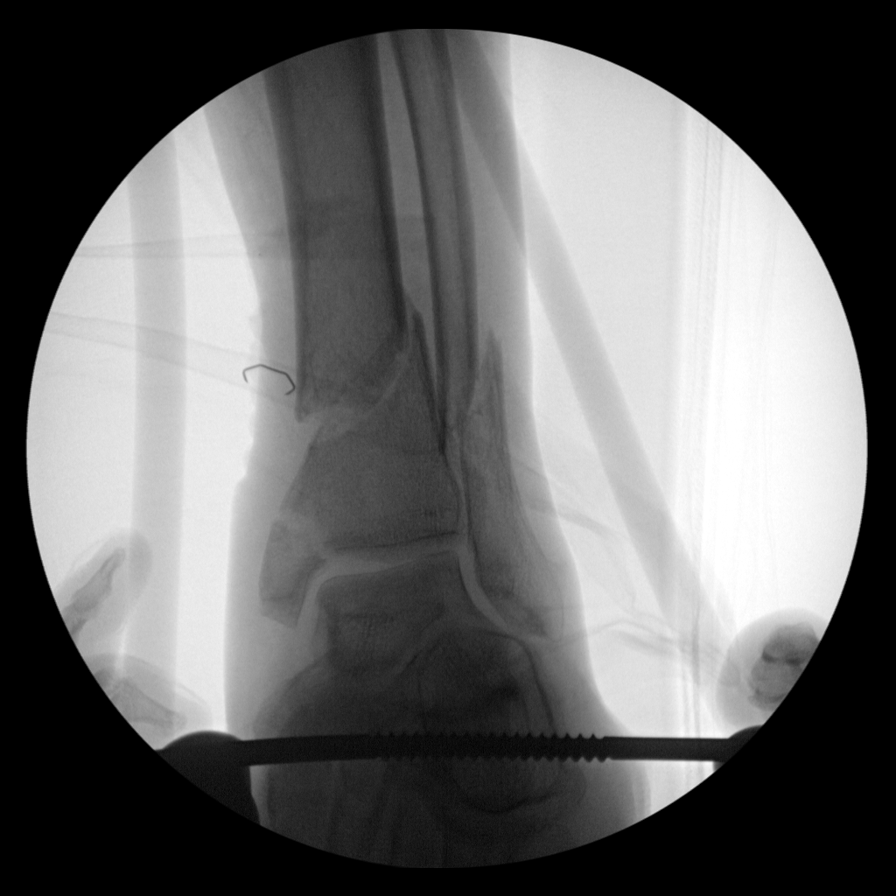
[im 6/6]
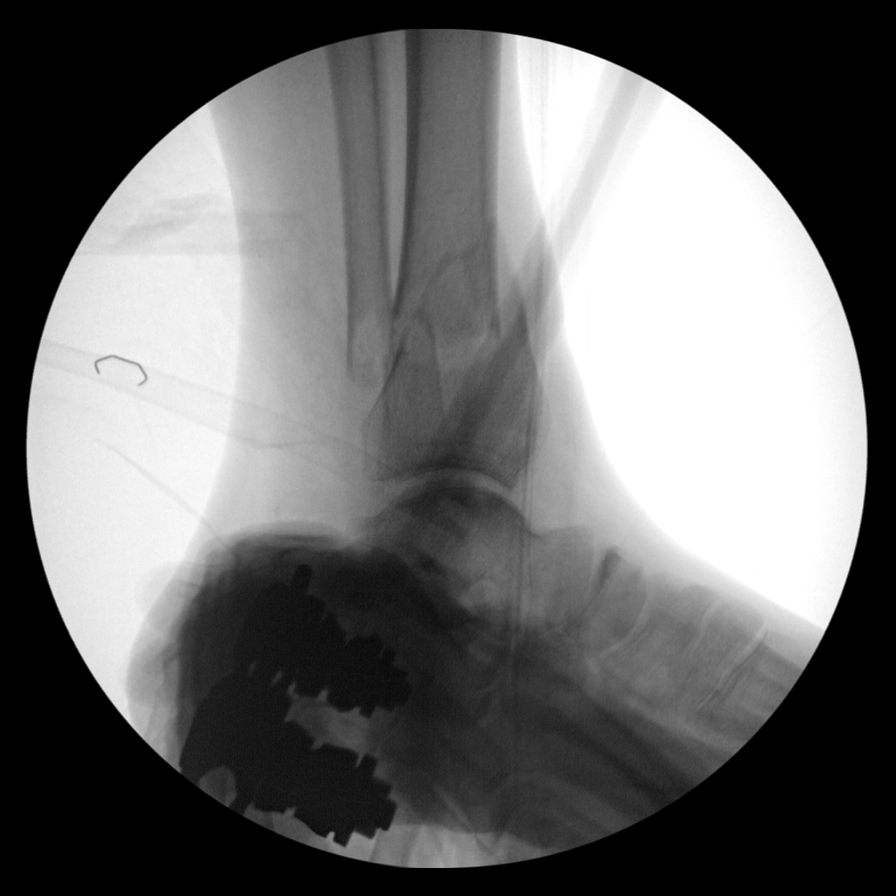

[6 of 6 positions shown; findings below may reference images not displayed]

FINDINGS: The patient has undergone external fixation of the left lower
extremity. Again noted are displaced fractures of the tibia and
fibula. The osseous alignment is improved.
IMPRESSION: Status post external fixation of the left lower extremity.

## 2021-08-30 IMAGING — RF DG TIBIA/FIBULA 2V*R*
1 series · 8 of 8 positions shown · non-contrast
Comparison: September 13, 2019

CLINICAL DATA: Fracture

EXAM:
RIGHT TIBIA AND FIBULA - 2 VIEW

[Series 1: run · 8 of 8 slices shown]
[im 1/8]
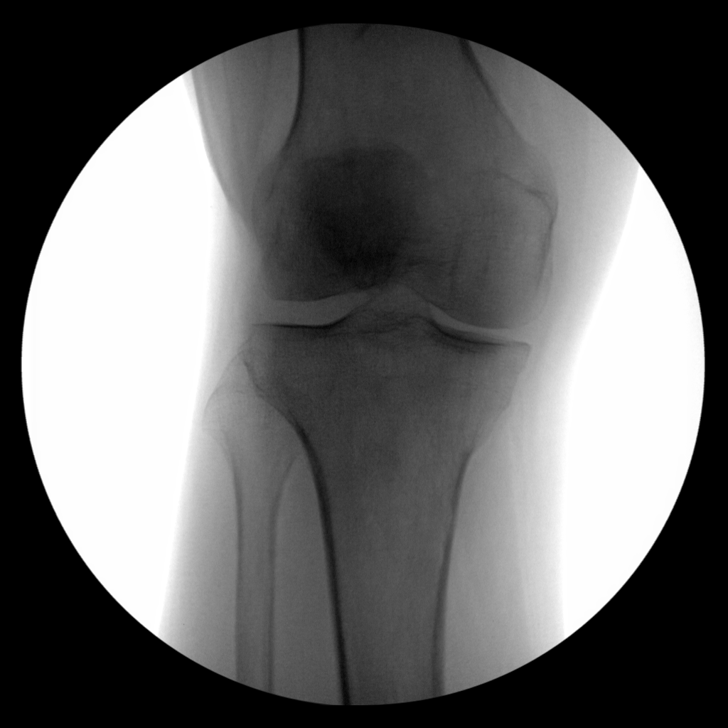
[im 2/8]
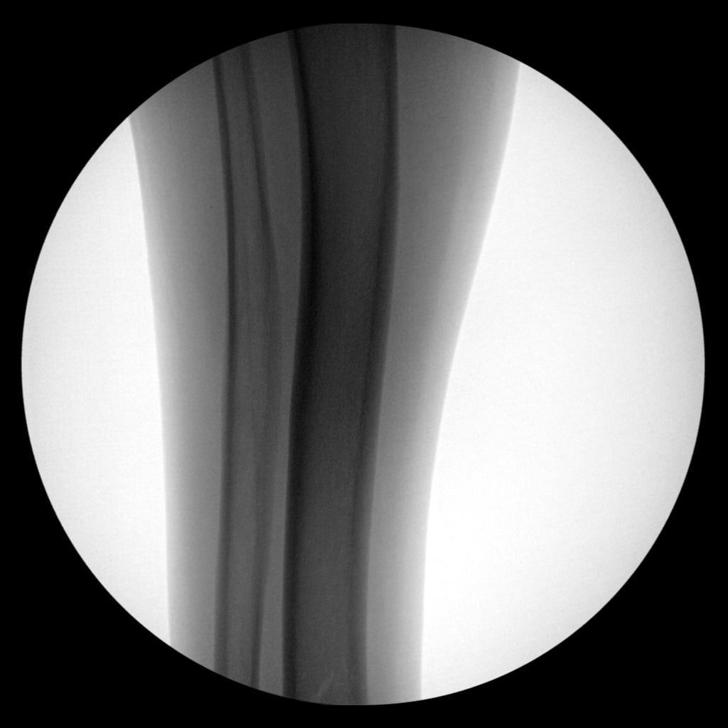
[im 3/8]
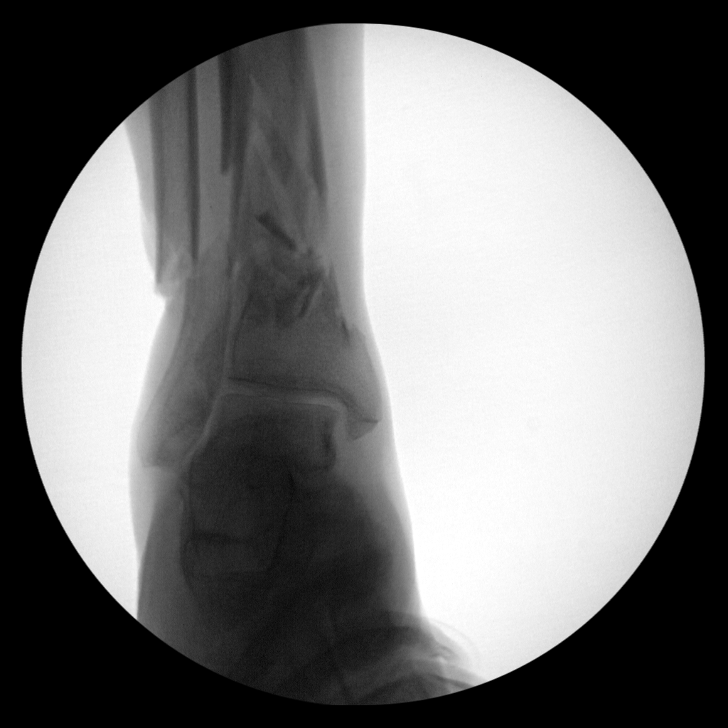
[im 4/8]
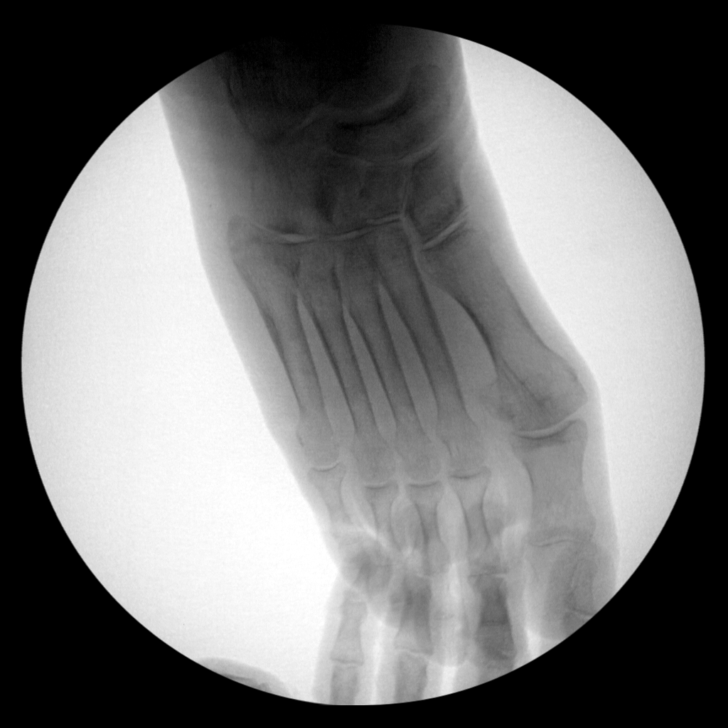
[im 5/8]
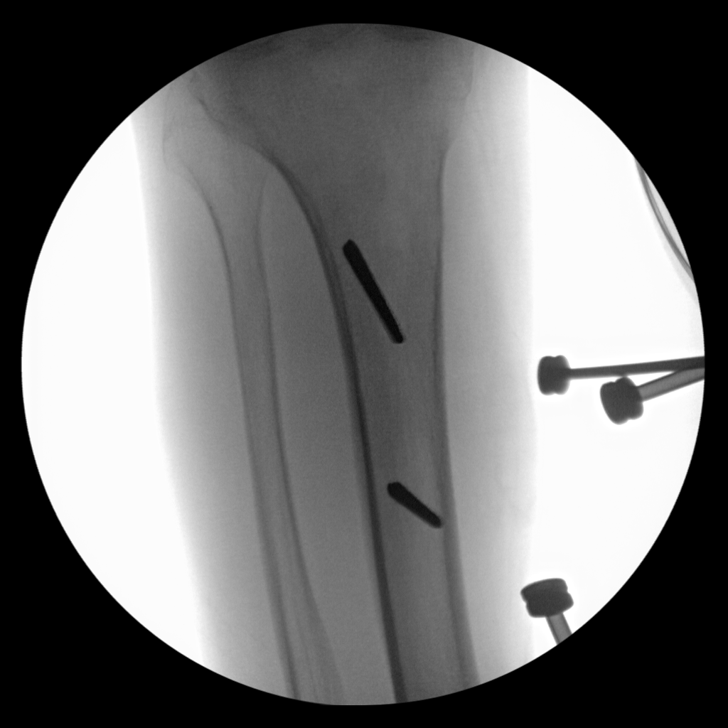
[im 6/8]
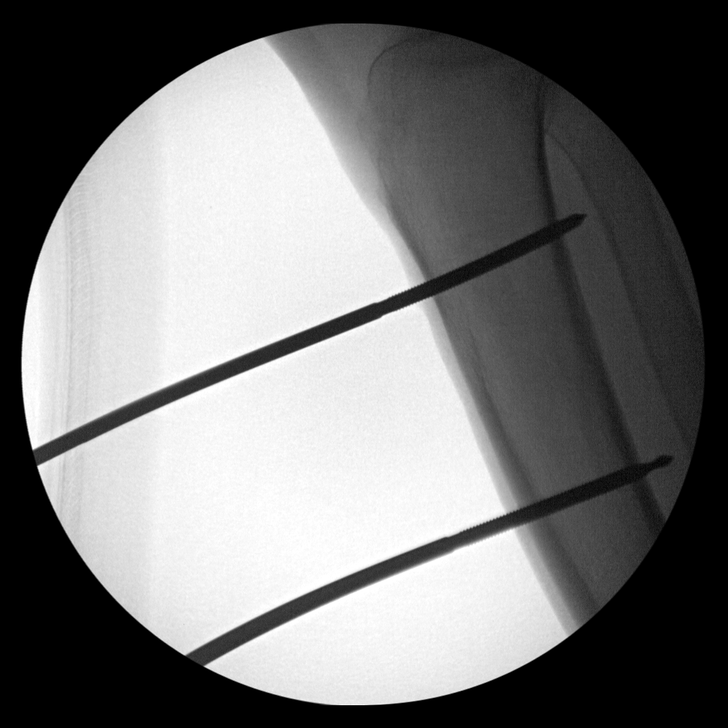
[im 7/8]
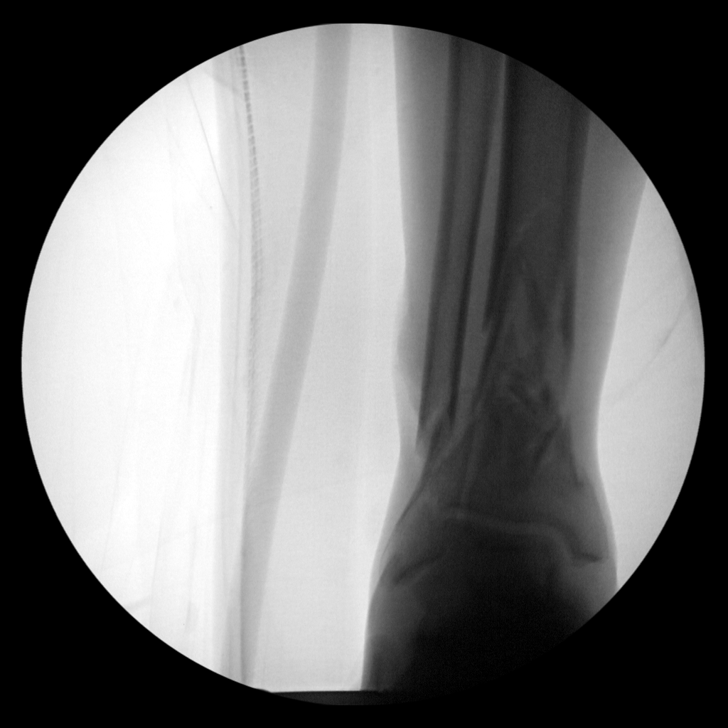
[im 8/8]
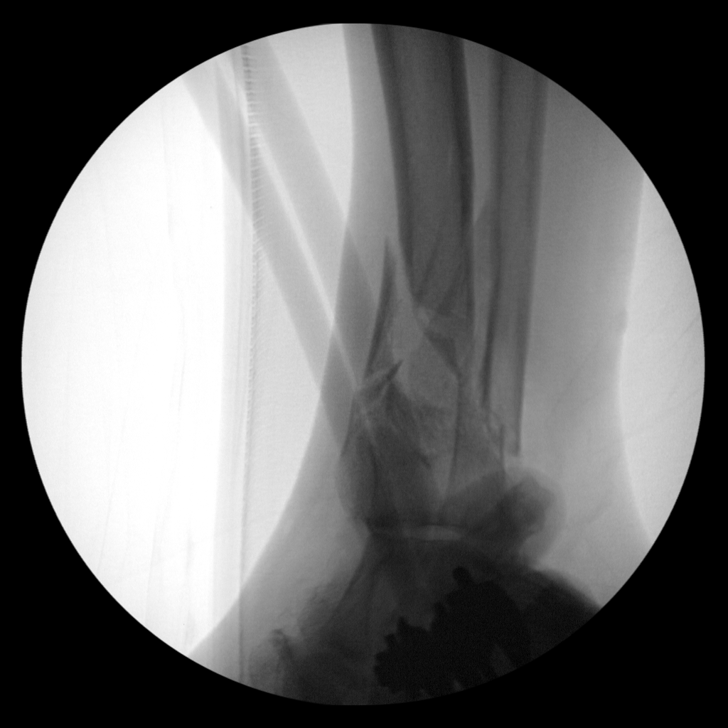

[8 of 8 positions shown; findings below may reference images not displayed]

FINDINGS: The patient has undergone external fixation of the right lower
extremity. Again noted are fractures of the distal tibia and fibula.
The alignment is significantly improved post reduction.
IMPRESSION: Status post external fixation of the right lower extremity.
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
## 2021-08-30 IMAGING — CT CT HEAD W/O CM
4 series · 16 of 47 positions shown, 18 images · non-contrast
Comparison: 2408

CLINICAL DATA: Head trauma

EXAM:
CT HEAD WITHOUT CONTRAST
TECHNIQUE: Contiguous axial images were obtained from the base of the skull
through the vertex without intravenous contrast.

[Series 3: head wo · axial · 0.47mm/px · z∈[-104,+26]mm · 7 of 36 slices shown, 9 images]
[im 5/36  brain]
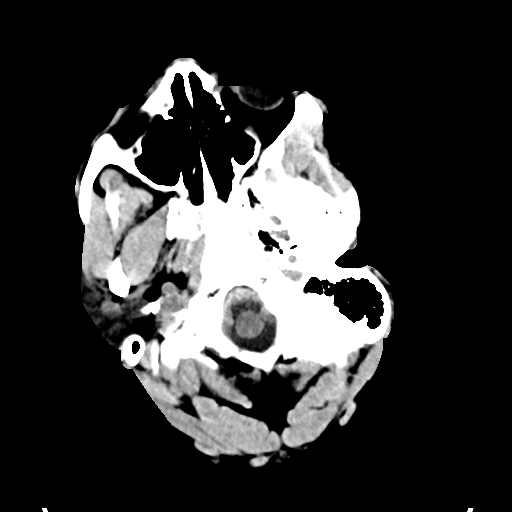
[im 5/36  bone]
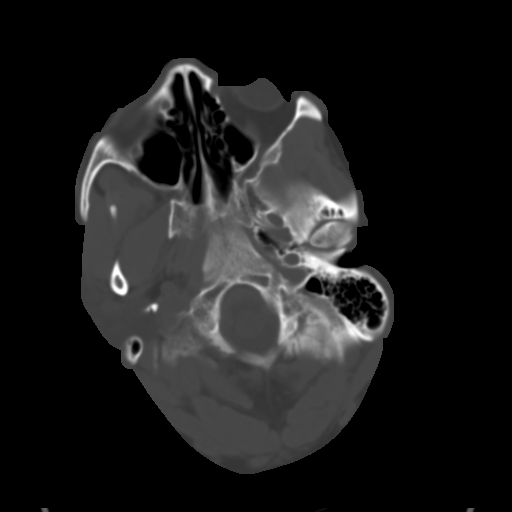
[im 9/36  brain]
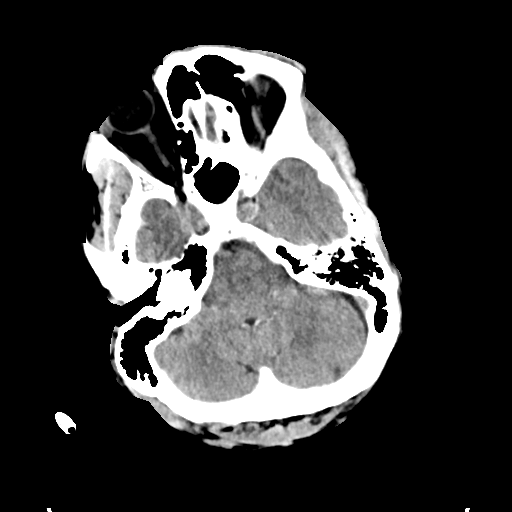
[im 14/36  brain]
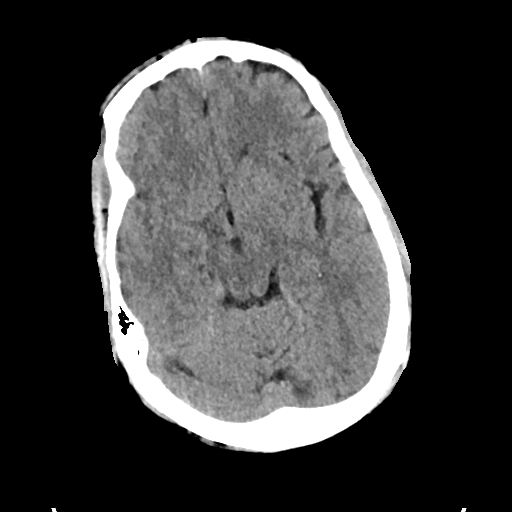
[im 18/36  brain]
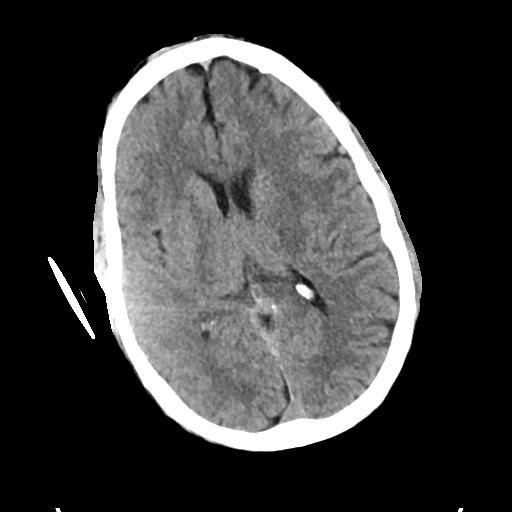
[im 22/36  brain]
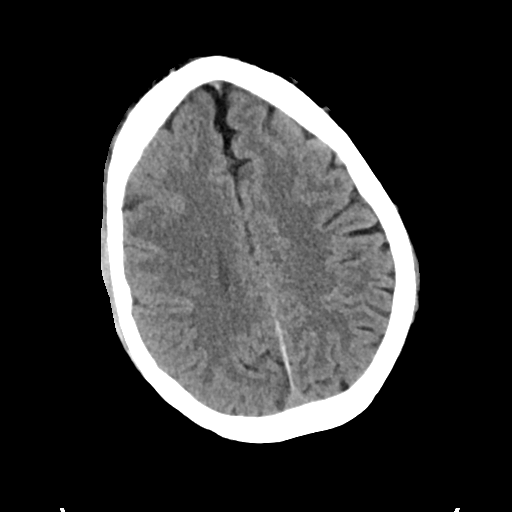
[im 22/36  bone]
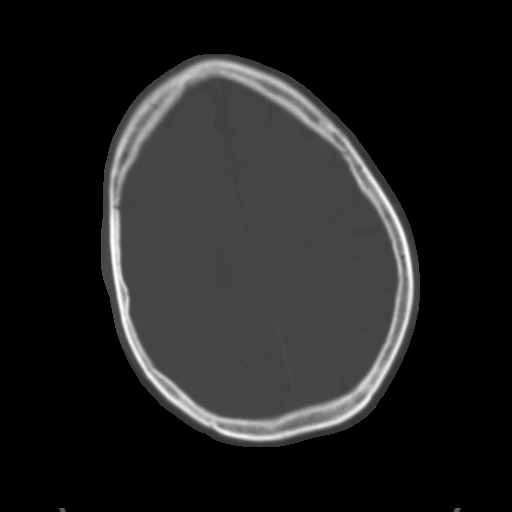
[im 27/36  brain]
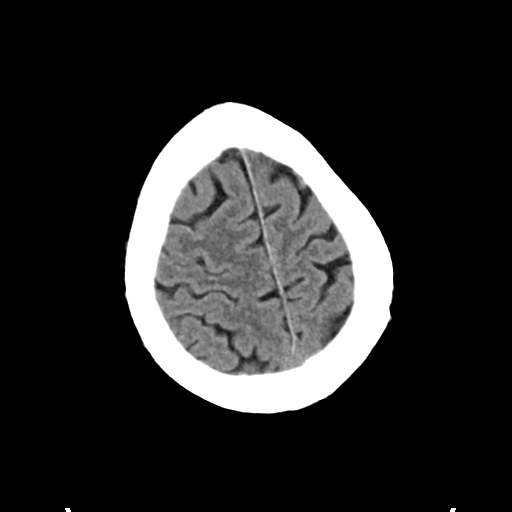
[im 31/36  brain]
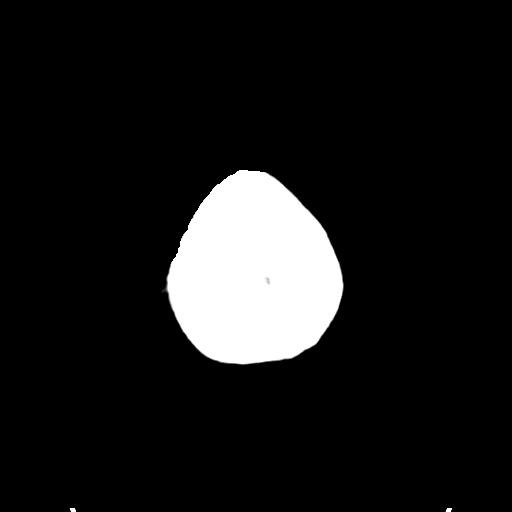

[Series 4: head bone · axial · 0.47mm/px · z∈[-108,-72]mm · 3 of 89 slices shown]
[im 9/89  bone]
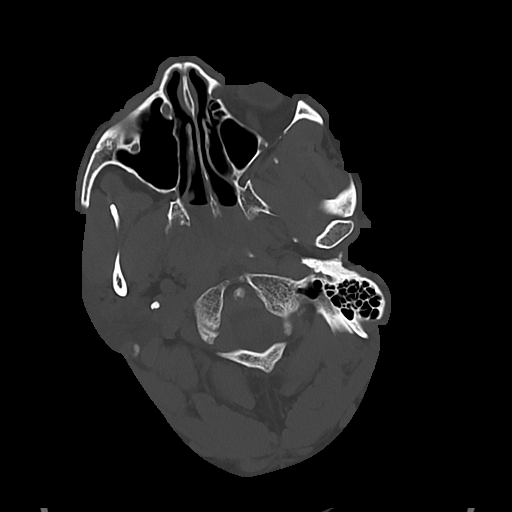
[im 18/89  bone]
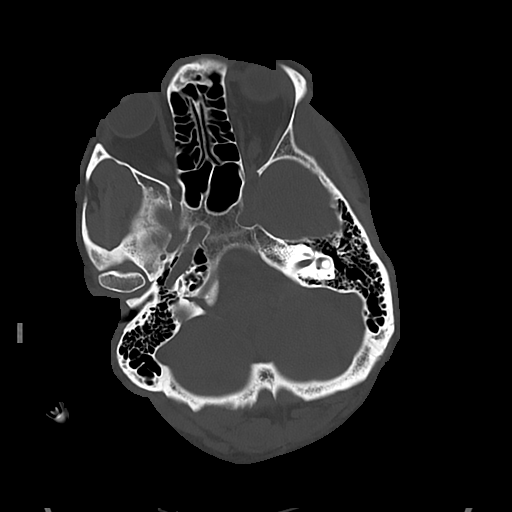
[im 27/89  bone]
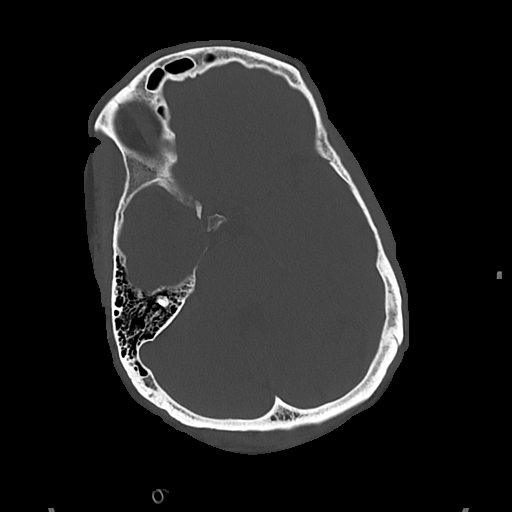

[Series 5: cor soft · coronal · 0.35mm/px · 3 of 73 slices shown]
[im 25/73  brain]
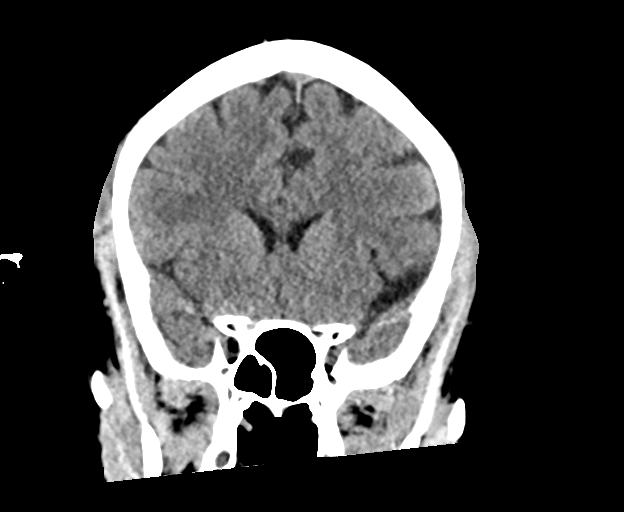
[im 33/73  brain]
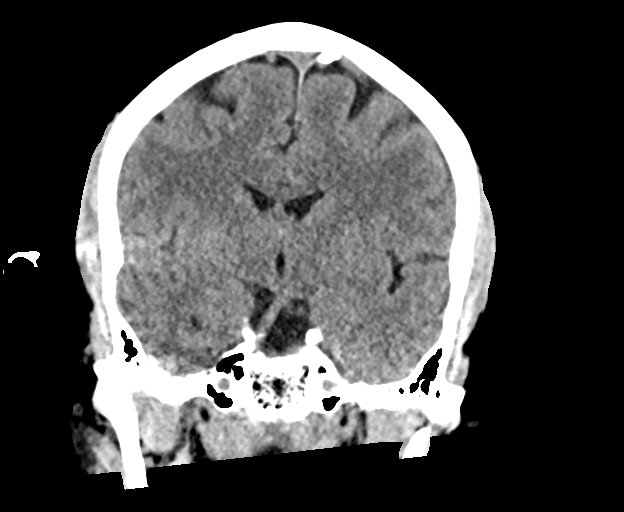
[im 41/73  brain]
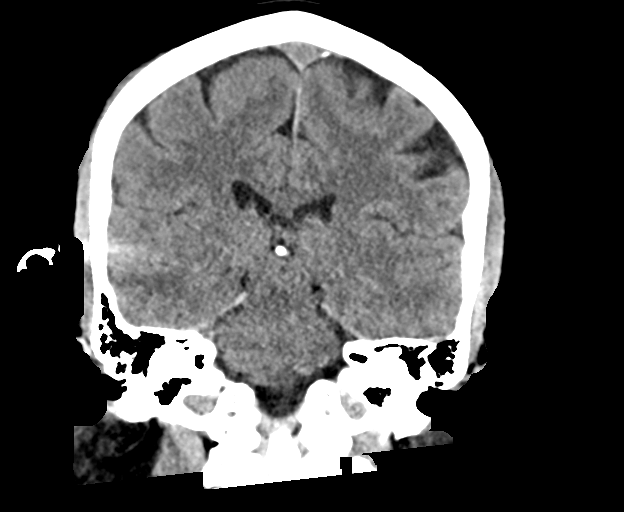

[Series 6: sag soft · sagittal · 0.38mm/px · 3 of 60 slices shown]
[im 20/60  brain]
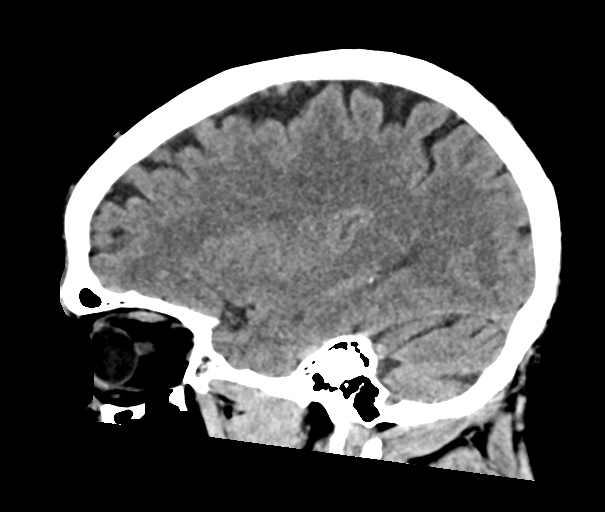
[im 30/60  brain]
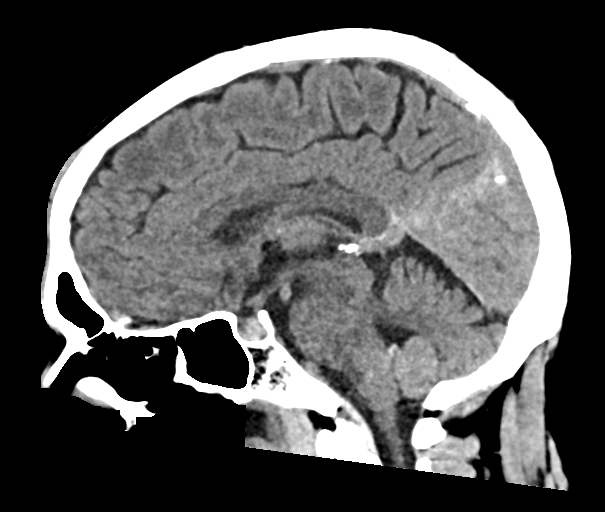
[im 40/60  brain]
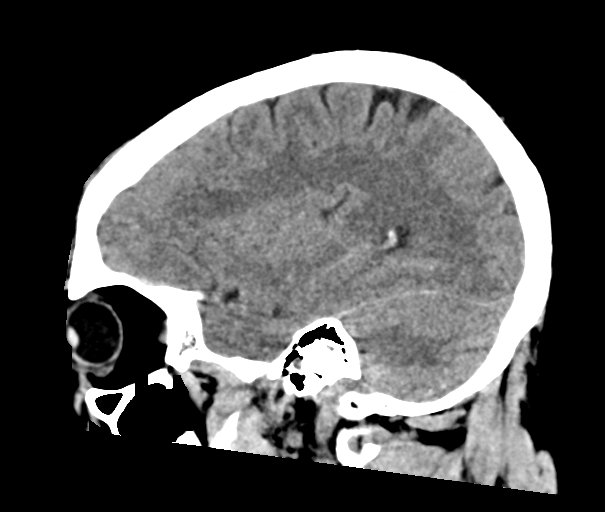

[16 of 47 positions shown; findings below may reference images not displayed]

FINDINGS: Brain: There is no acute intracranial hemorrhage, mass effect, or
edema. Gray-white differentiation remains preserved. Ventricles and
sulci are normal in size and configuration. Minimal white matter
disease on MRI is not well seen on CT. There is no extra-axial fluid
collection or hematoma.

Vascular: Unremarkable.

Skull: Calvarium is intact.

Sinuses/Orbits: Aerated.  Orbits are unremarkable.

Other: Mastoid air cells are clear.
IMPRESSION: No evidence of acute intracranial injury.
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
## 2021-08-31 IMAGING — DX DG FEMUR 2+V PORT*L*
4 series · 4 of 4 positions shown · non-contrast
Comparison: 09/13/2019

CLINICAL DATA: Fracture

EXAM:
LEFT FEMUR PORTABLE 2 VIEWS

[femur ap (1 of 2)]
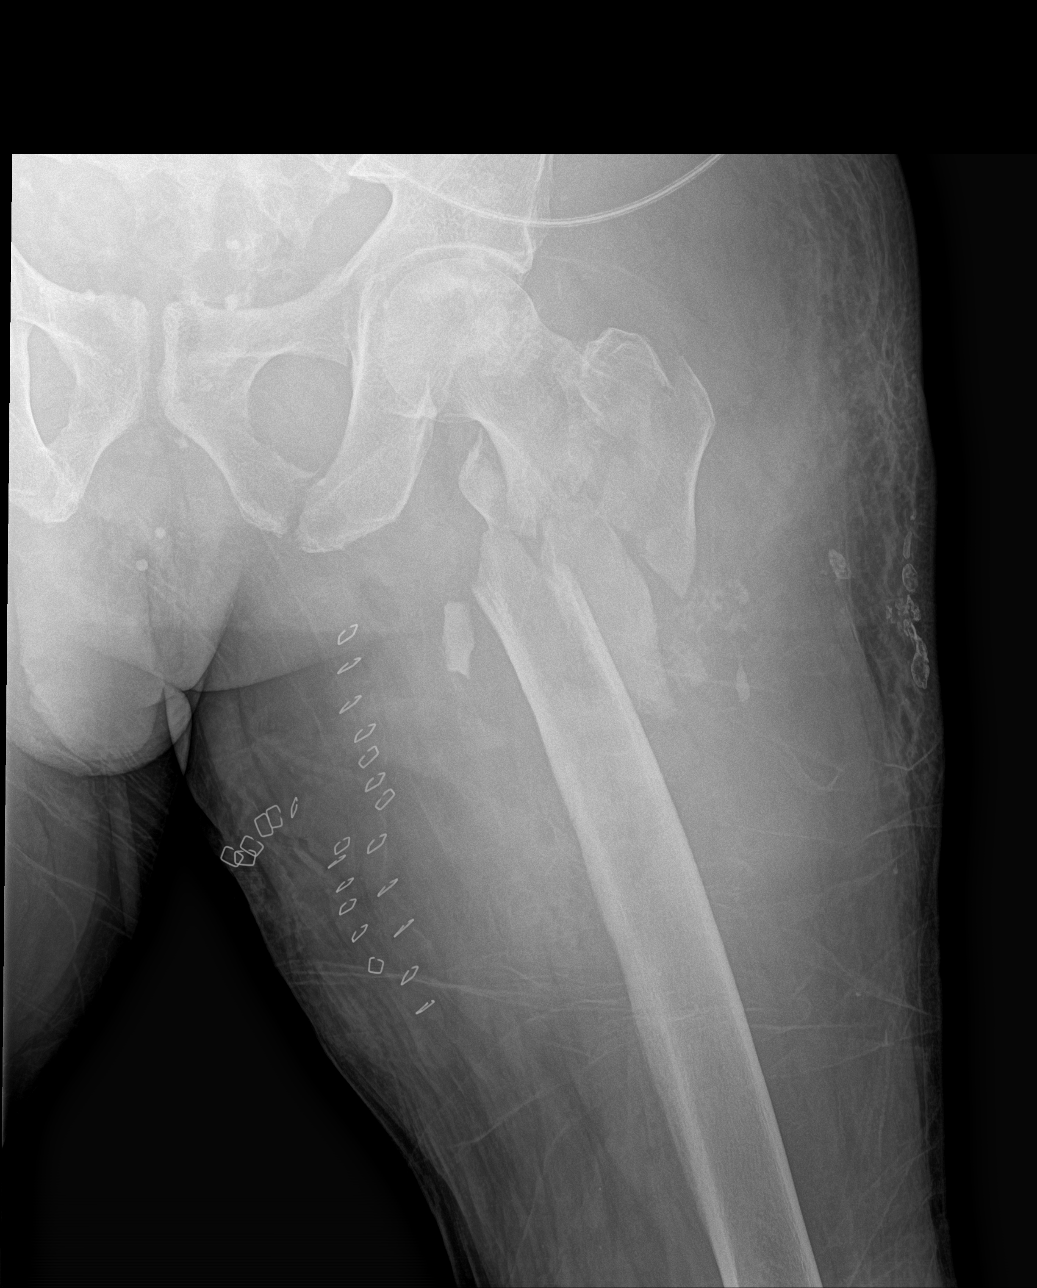

[femur ap (2 of 2)]
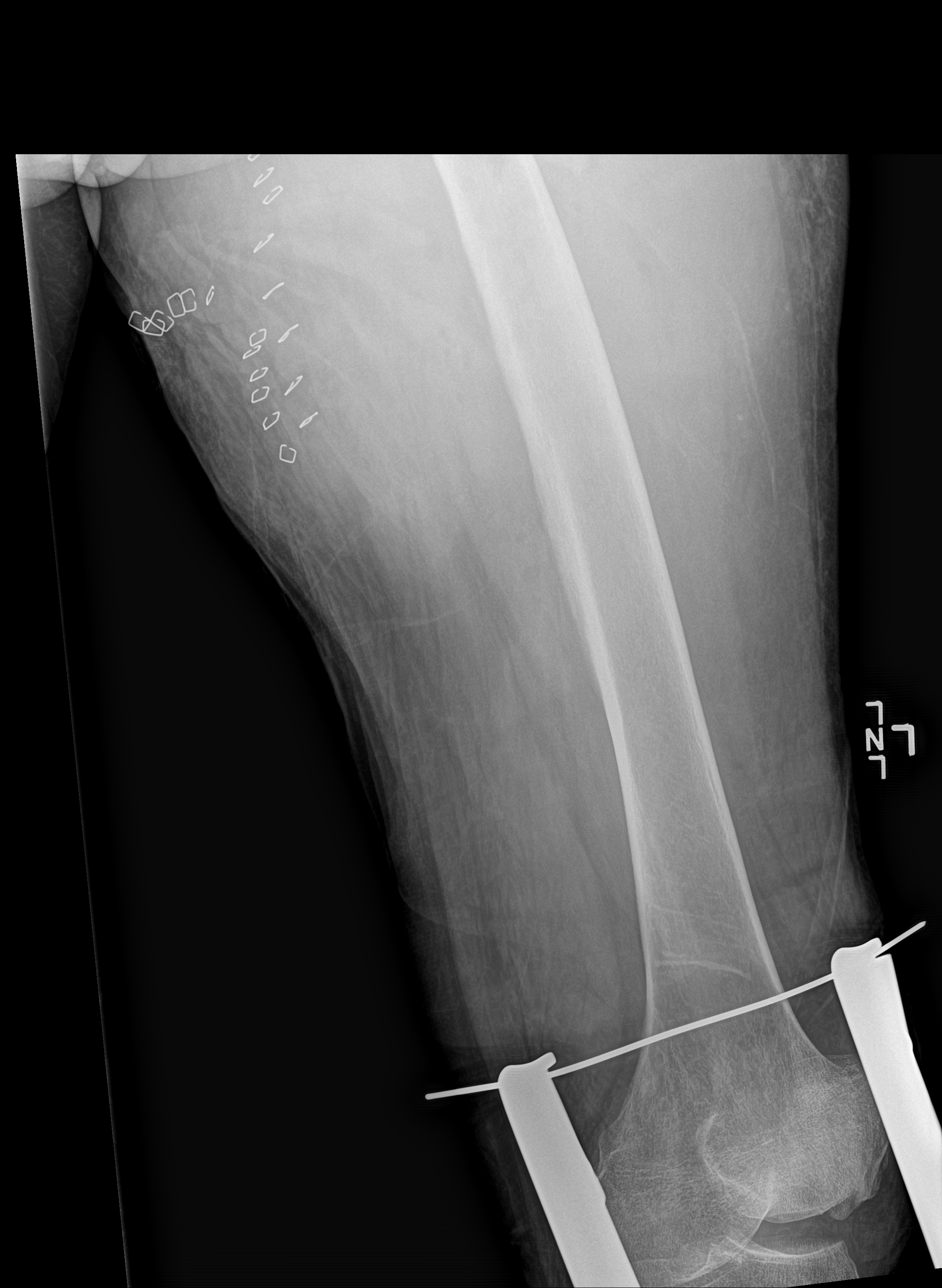

[femur lat (1 of 2)]
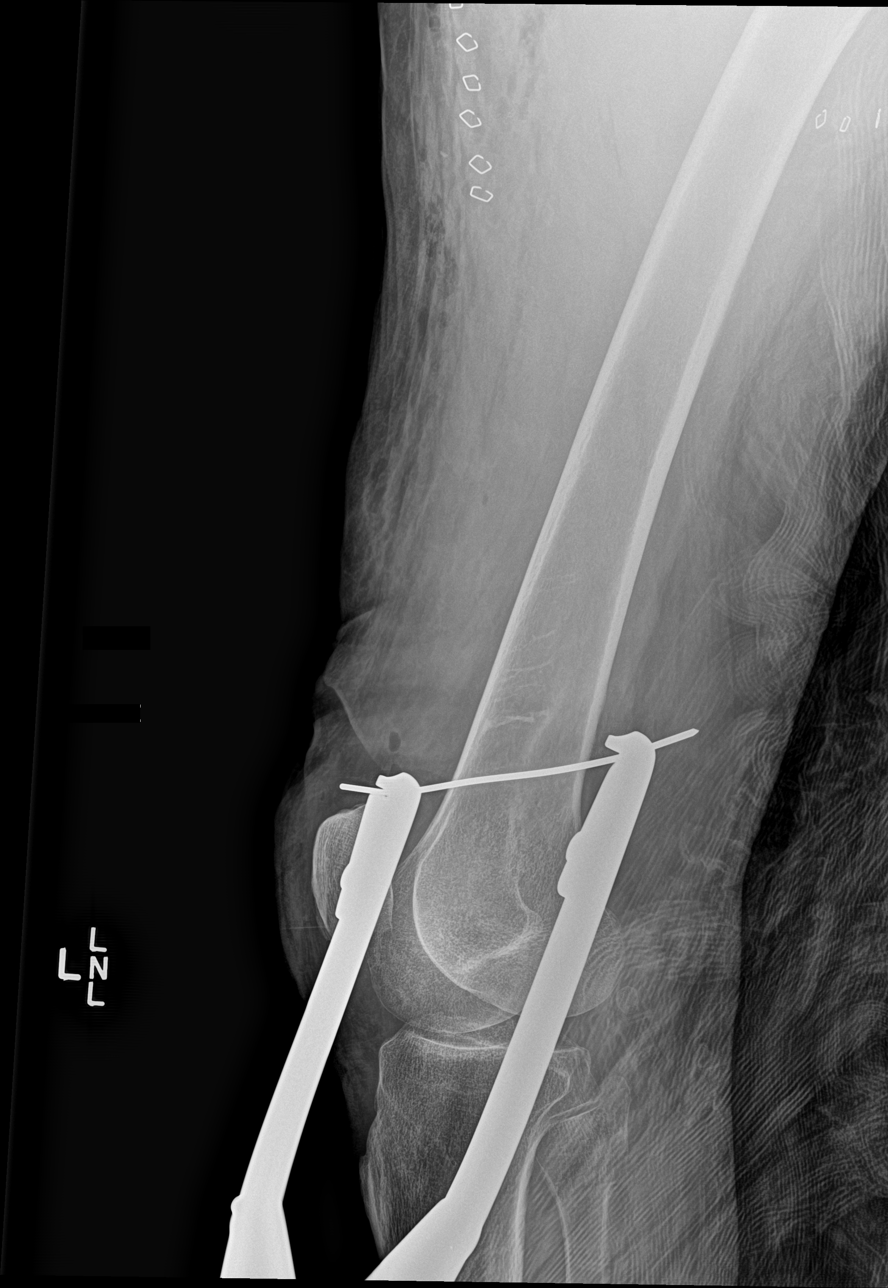

[femur lat (2 of 2)]
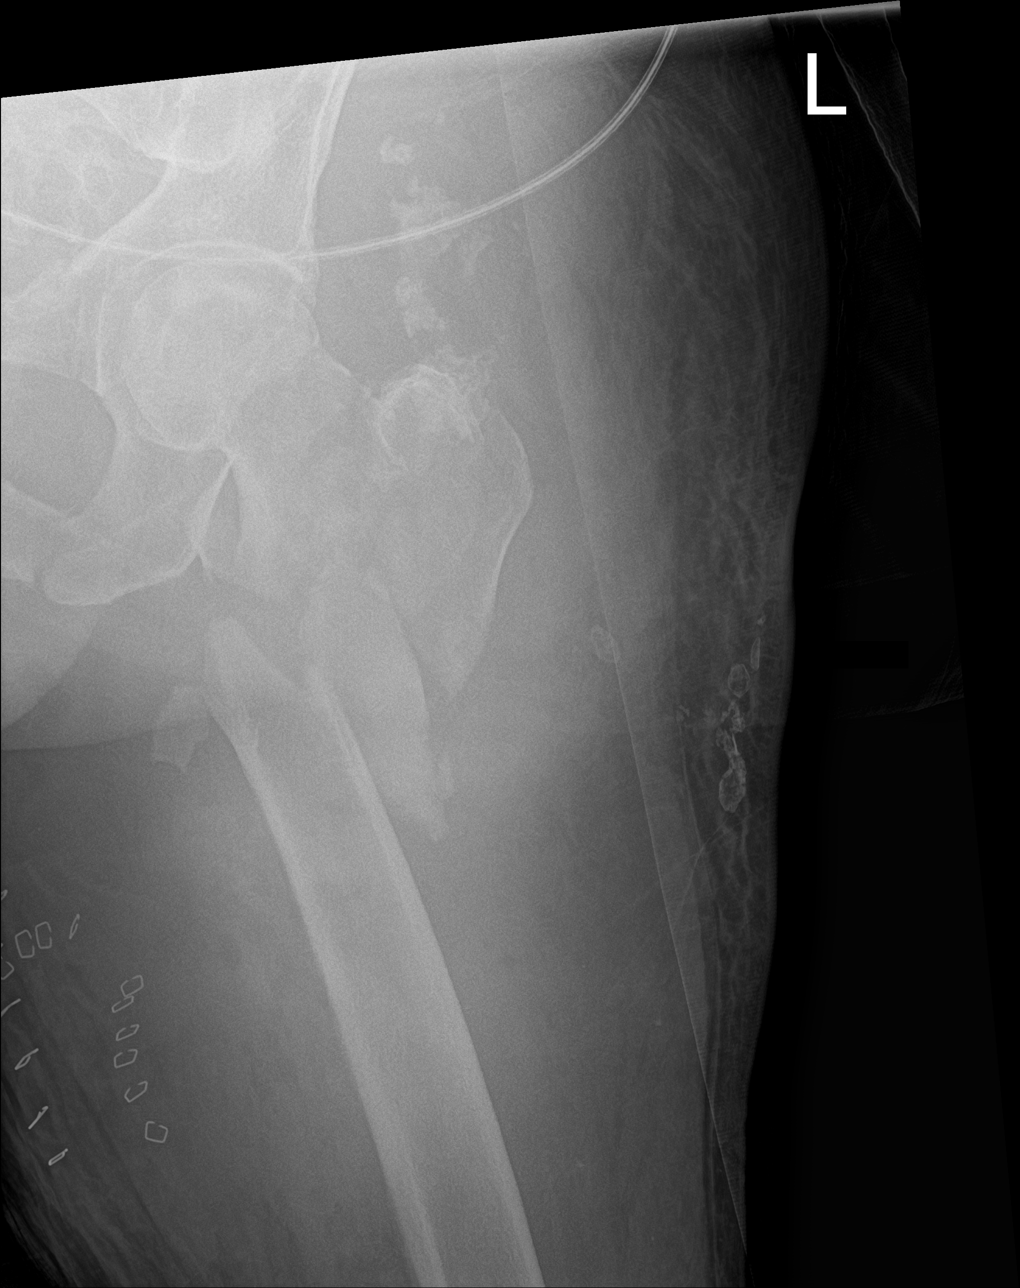

[4 of 4 positions shown; findings below may reference images not displayed]

FINDINGS: Again noted is a highly comminuted fracture of the proximal left
femur with improved osseous alignment. Multiple skin staples are
noted. There is extensive surrounding soft tissue swelling.
Additional pelvic fractures are again noted, better visualized on
prior CT. An external fixator is again noted and is partially
visualized.
IMPRESSION: Highly comminuted fracture of the proximal left femur with improved
osseous alignment. Extensive surrounding soft tissue swelling is
noted.

## 2021-08-31 IMAGING — DX DG PELVIS 3+V JUDET
5 series · 5 of 5 positions shown · non-contrast
Comparison: 09/13/2019

CLINICAL DATA: Pain status post trauma

EXAM:
JUDET PELVIS - 3+ VIEW

[pelvis ap]
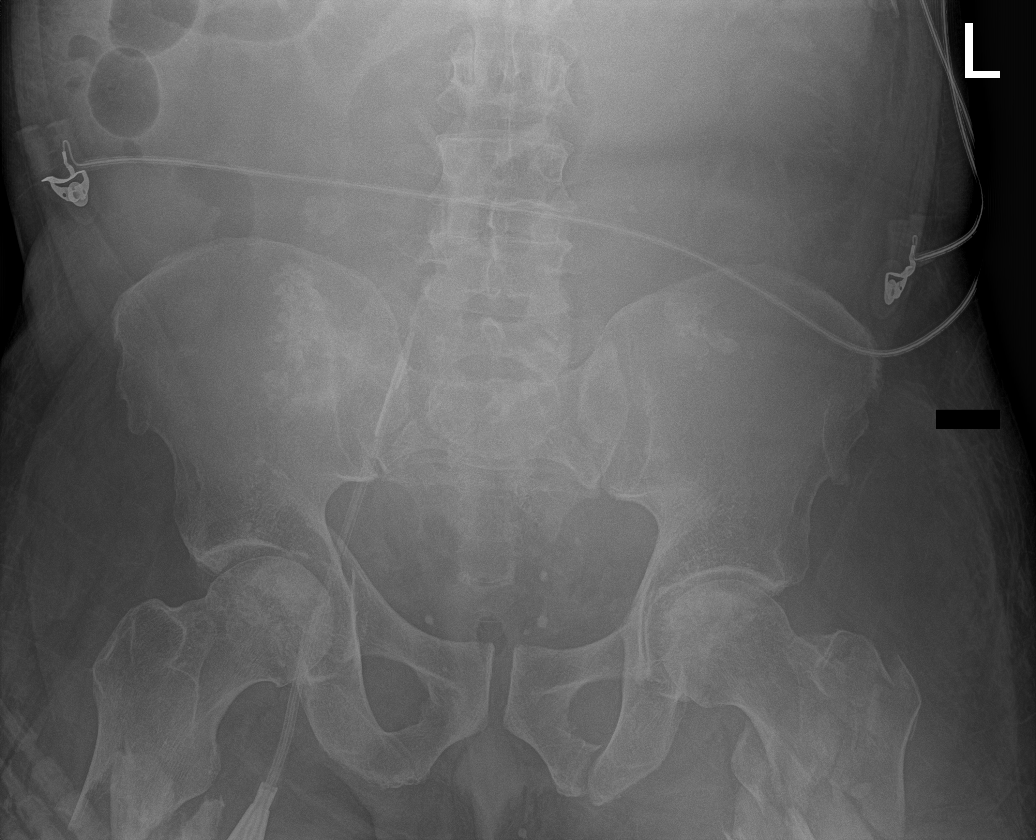

[pelvis obl (1 of 2)]
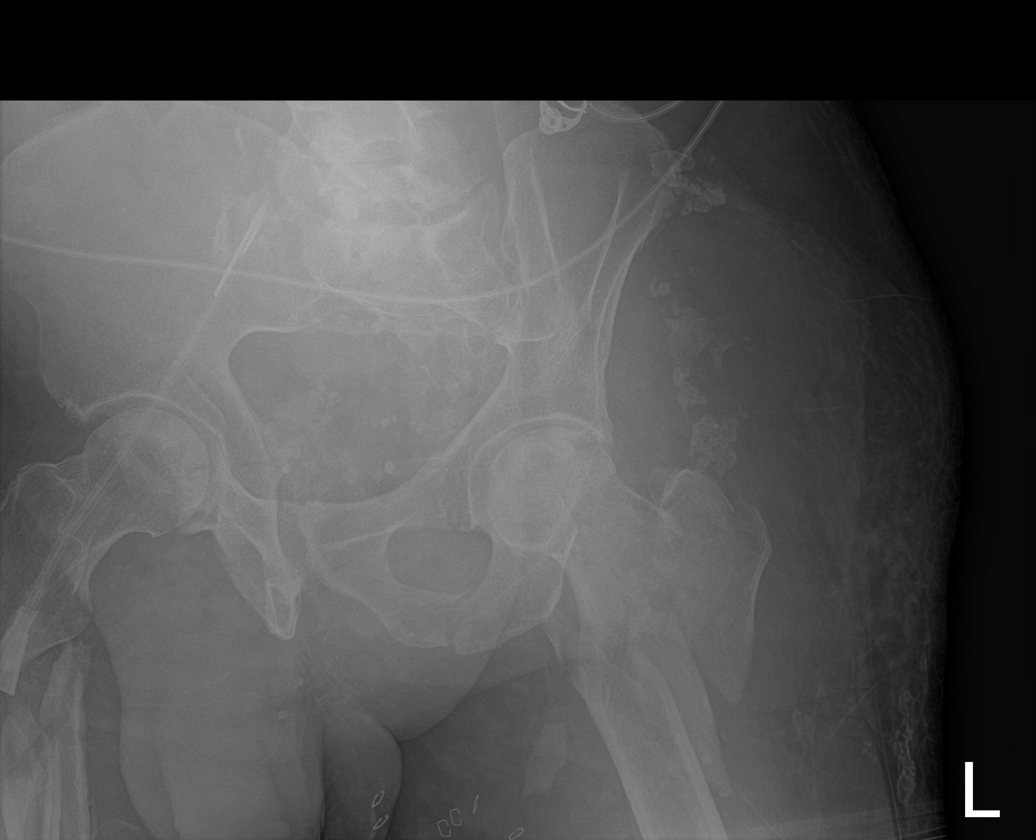

[pelvis obl (2 of 2)]
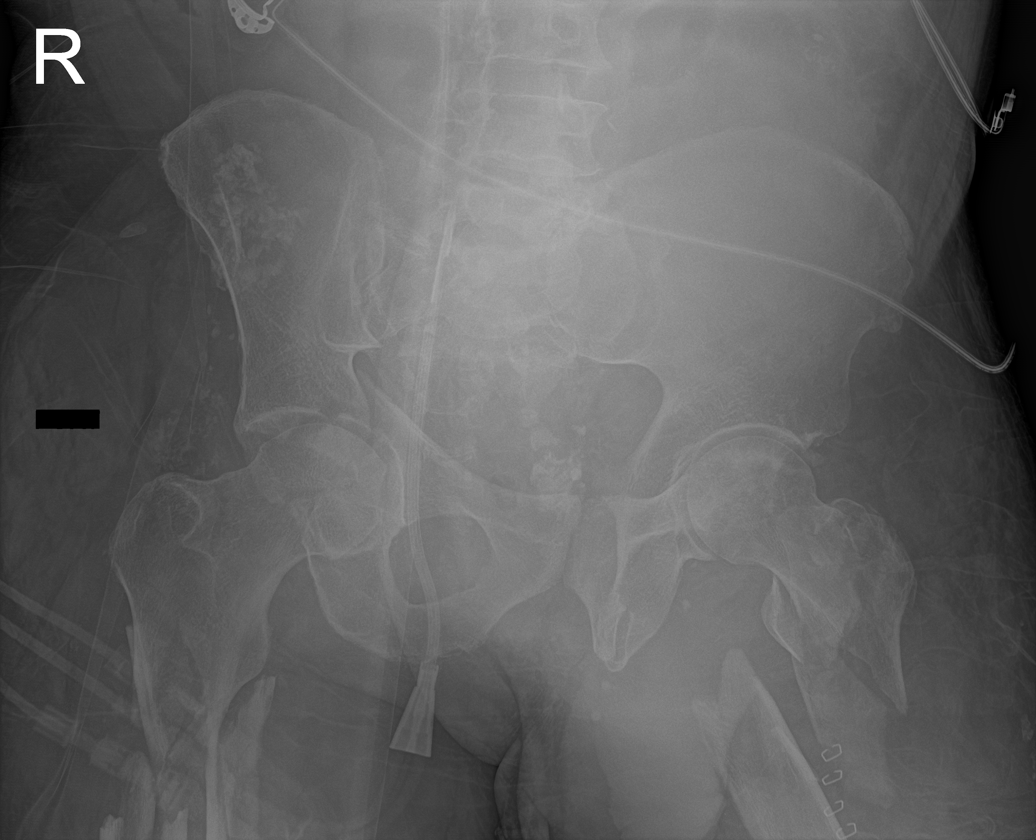

[pelvis inlet]
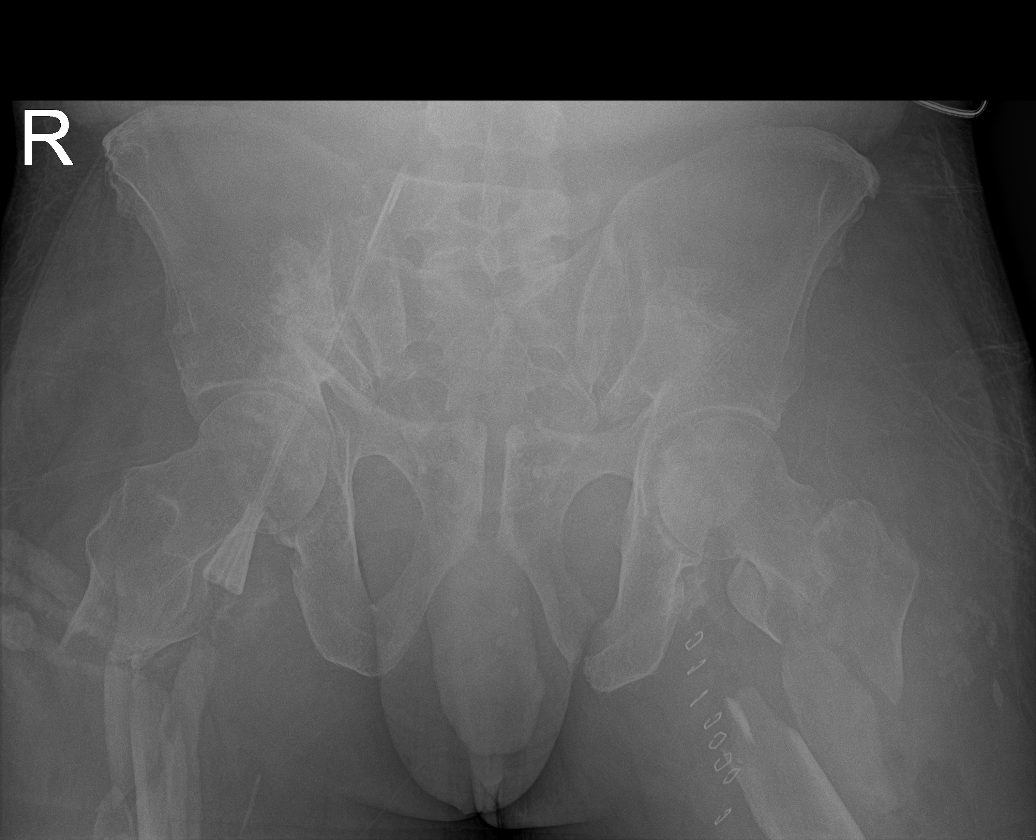

[pelvis outlet]
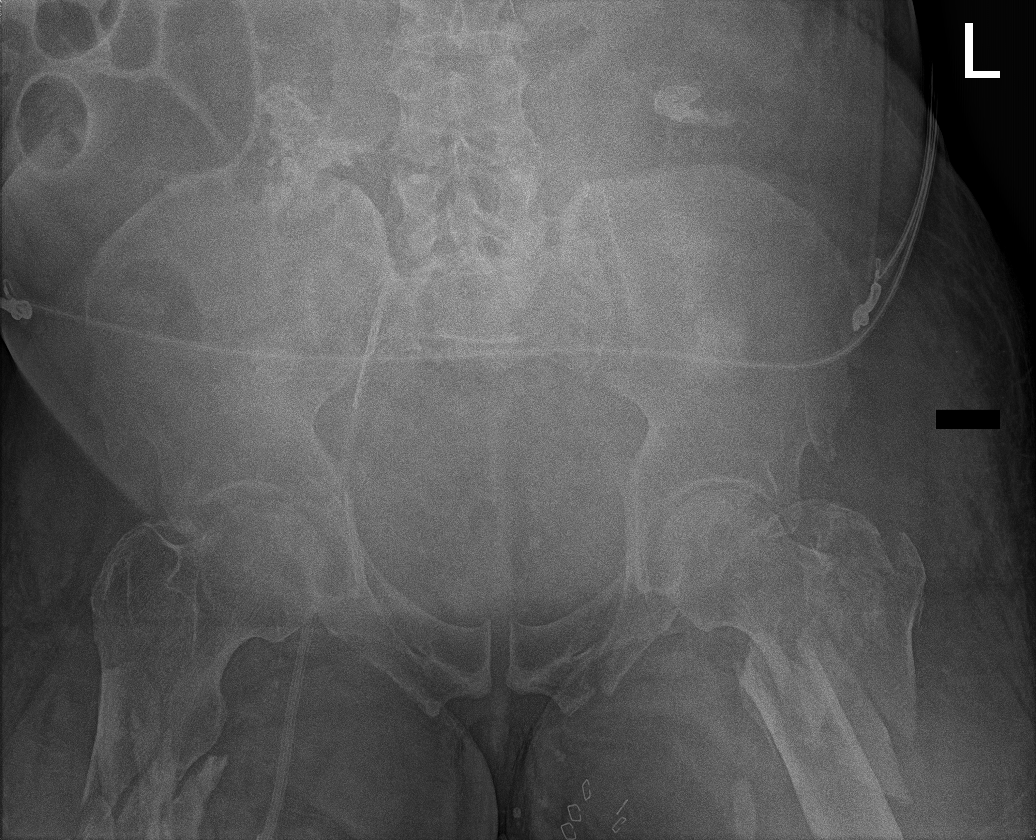

[5 of 5 positions shown; findings below may reference images not displayed]

FINDINGS: Again noted are highly comminuted fractures of the proximal femurs
bilaterally. Multiple pelvic fractures are again noted, better
visualized on prior CT. There is widening of the bilateral
sacroiliac joints. This is relatively stable from prior study.
Multiple phleboliths project over the patient's pelvis.
Calcifications are again noted in the patient's anterior abdominal
wall. There is a right-sided central venous catheter with tip
terminating near the L5-S1 level.
IMPRESSION: Extensive bilateral pelvic and femur fractures as detailed above.
These are better visualized on prior CT.

Right-sided central venous catheter terminates near the L5-S1 level.

Persistent widening of the bilateral sacroiliac joints.

## 2021-08-31 IMAGING — DX DG TIBIA/FIBULA PORT 2V*L*
5 series · 5 of 5 positions shown · non-contrast
Comparison: None.

CLINICAL DATA: Trauma

EXAM:
PORTABLE LEFT TIBIA AND FIBULA - 2 VIEW

[tibia ap (1 of 2)]
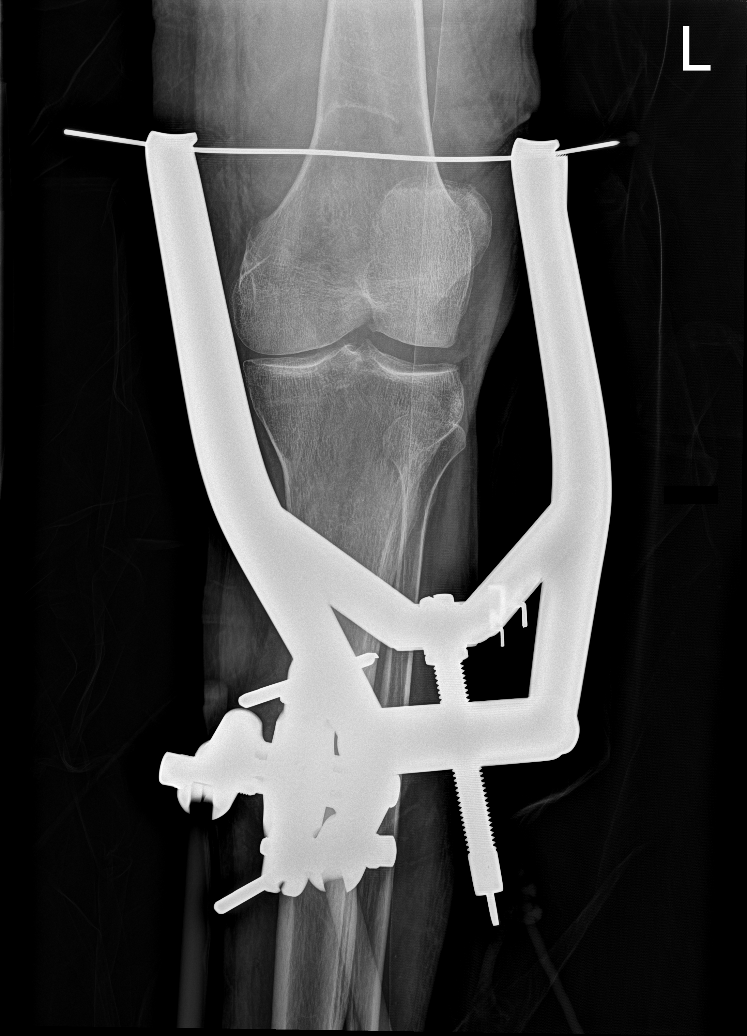

[tibia ap (2 of 2)]
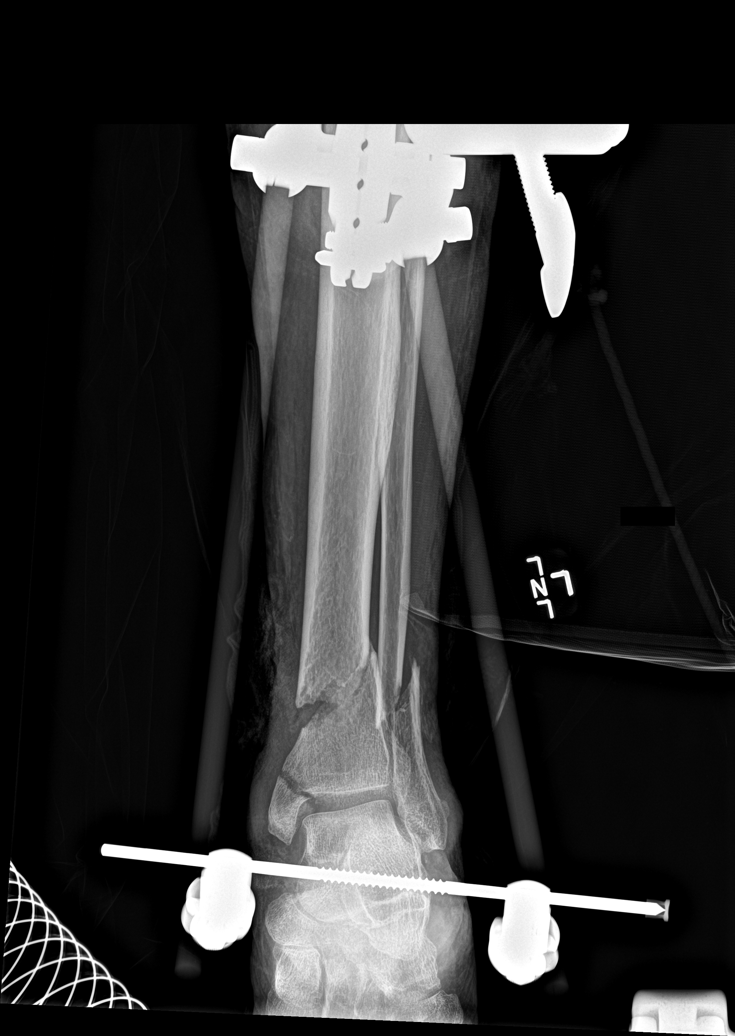

[tibia lat (1 of 3)]
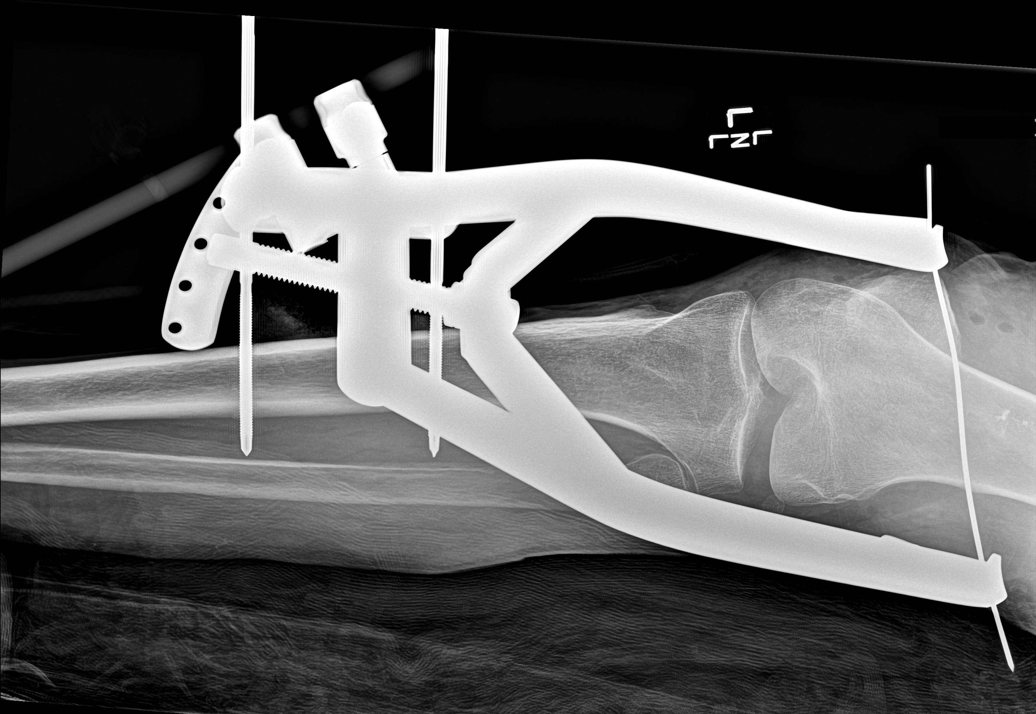

[tibia lat (2 of 3)]
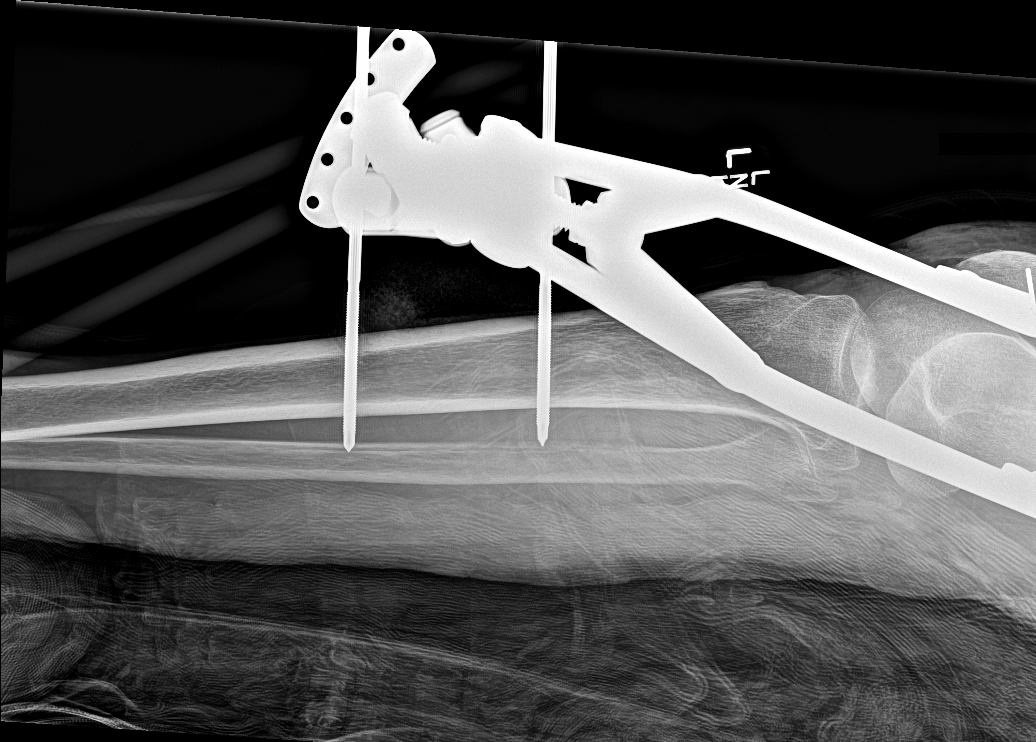

[tibia lat (3 of 3)]
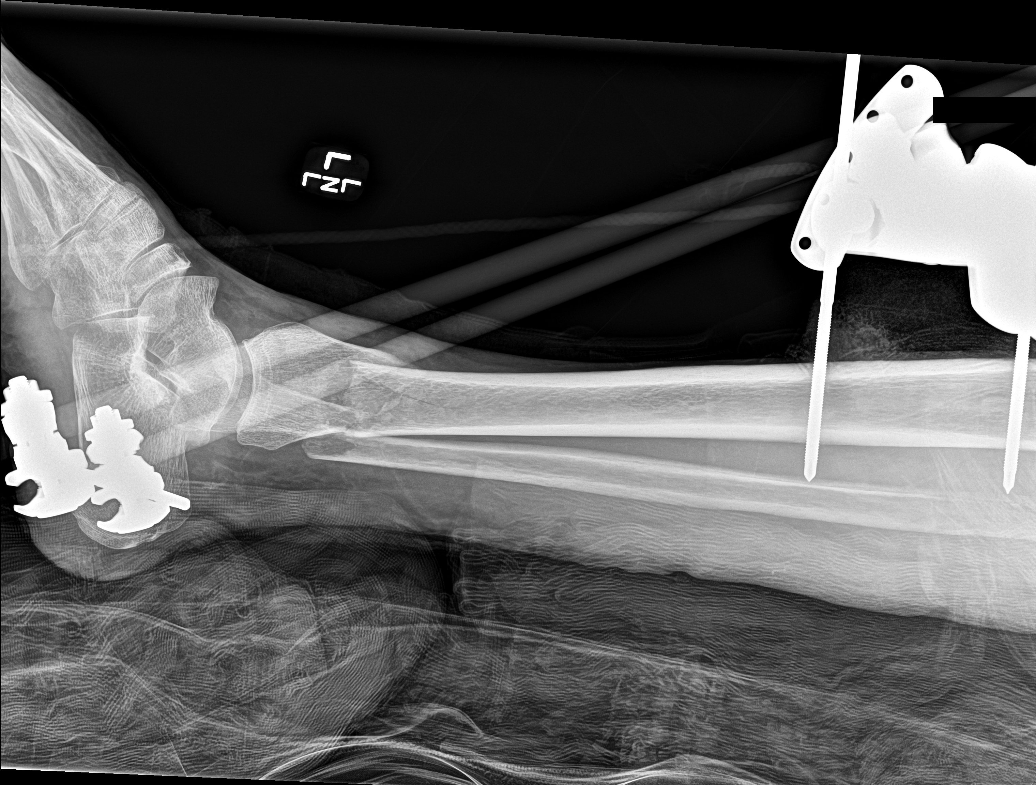

[5 of 5 positions shown; findings below may reference images not displayed]

FINDINGS: Placement of external fixator device across the distal tibia and
fibular fractures. Fractures are mildly displaced.
IMPRESSION: Placement of external fixator cross distal left tibia and fibular
fractures.

## 2021-08-31 IMAGING — DX DG TIBIA/FIBULA PORT 2V*R*
4 series · 4 of 4 positions shown · non-contrast
Comparison: 09/13/2019

CLINICAL DATA: Fracture

EXAM:
PORTABLE RIGHT TIBIA AND FIBULA - 2 VIEW

[tibia ap (1 of 2)]
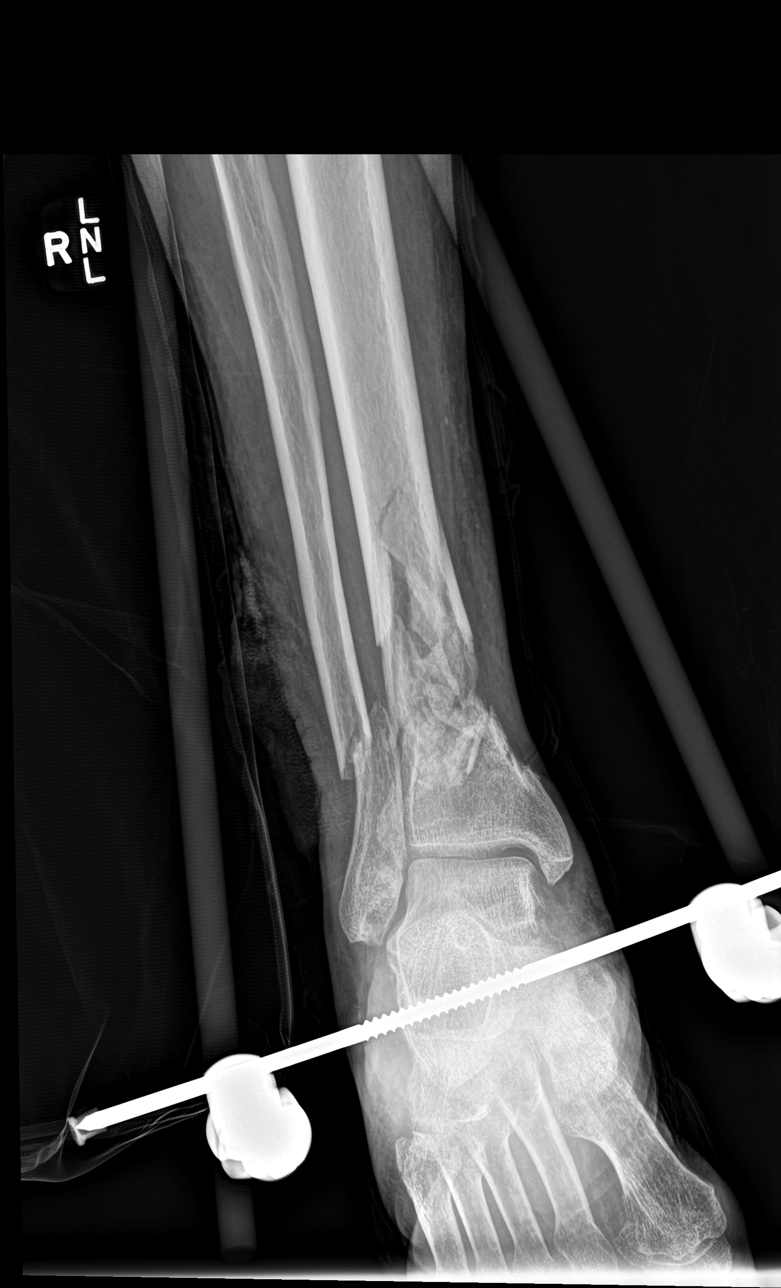

[tibia ap (2 of 2)]
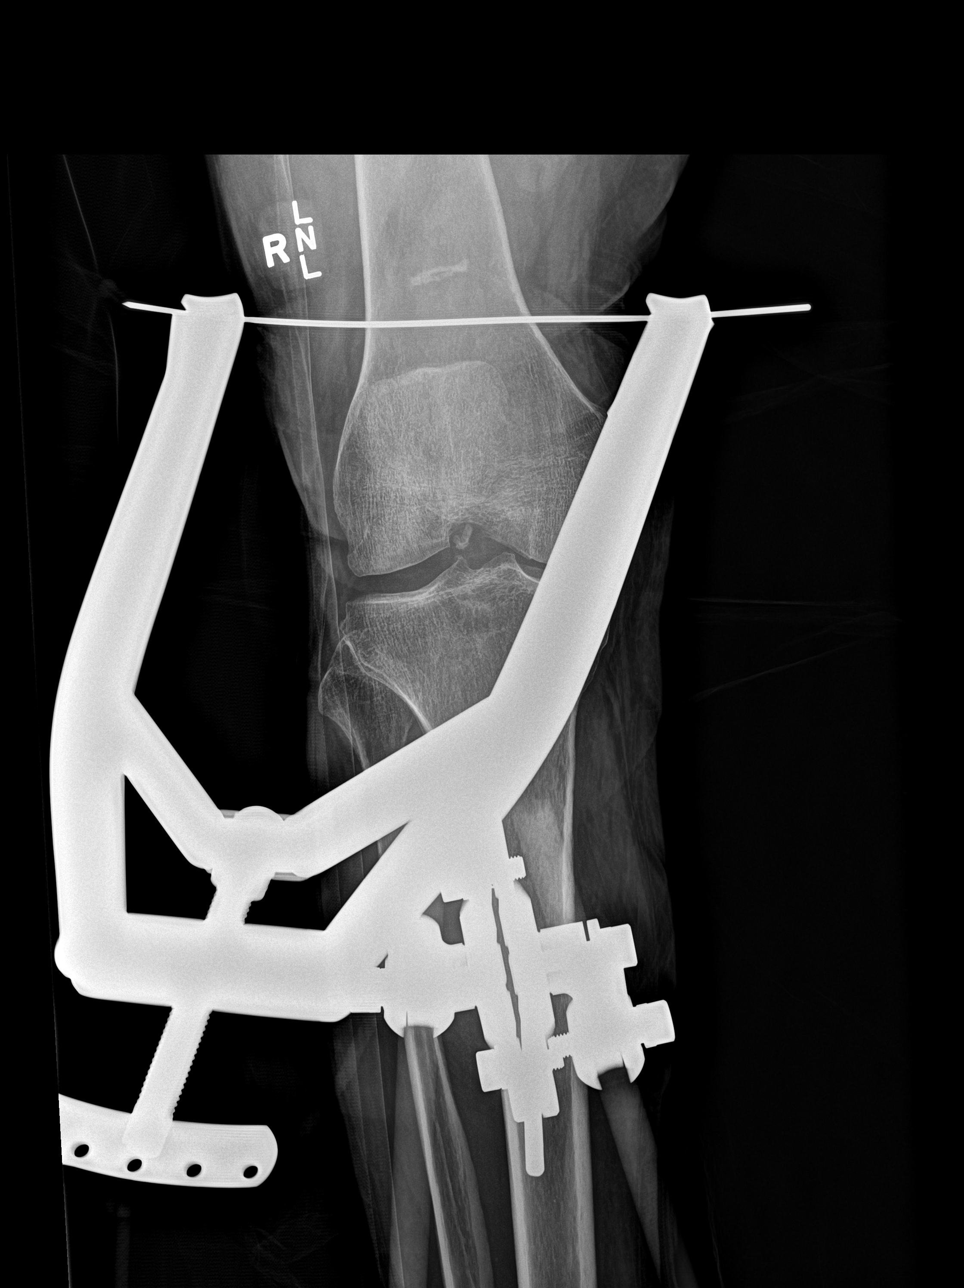

[tibia lat (1 of 2)]
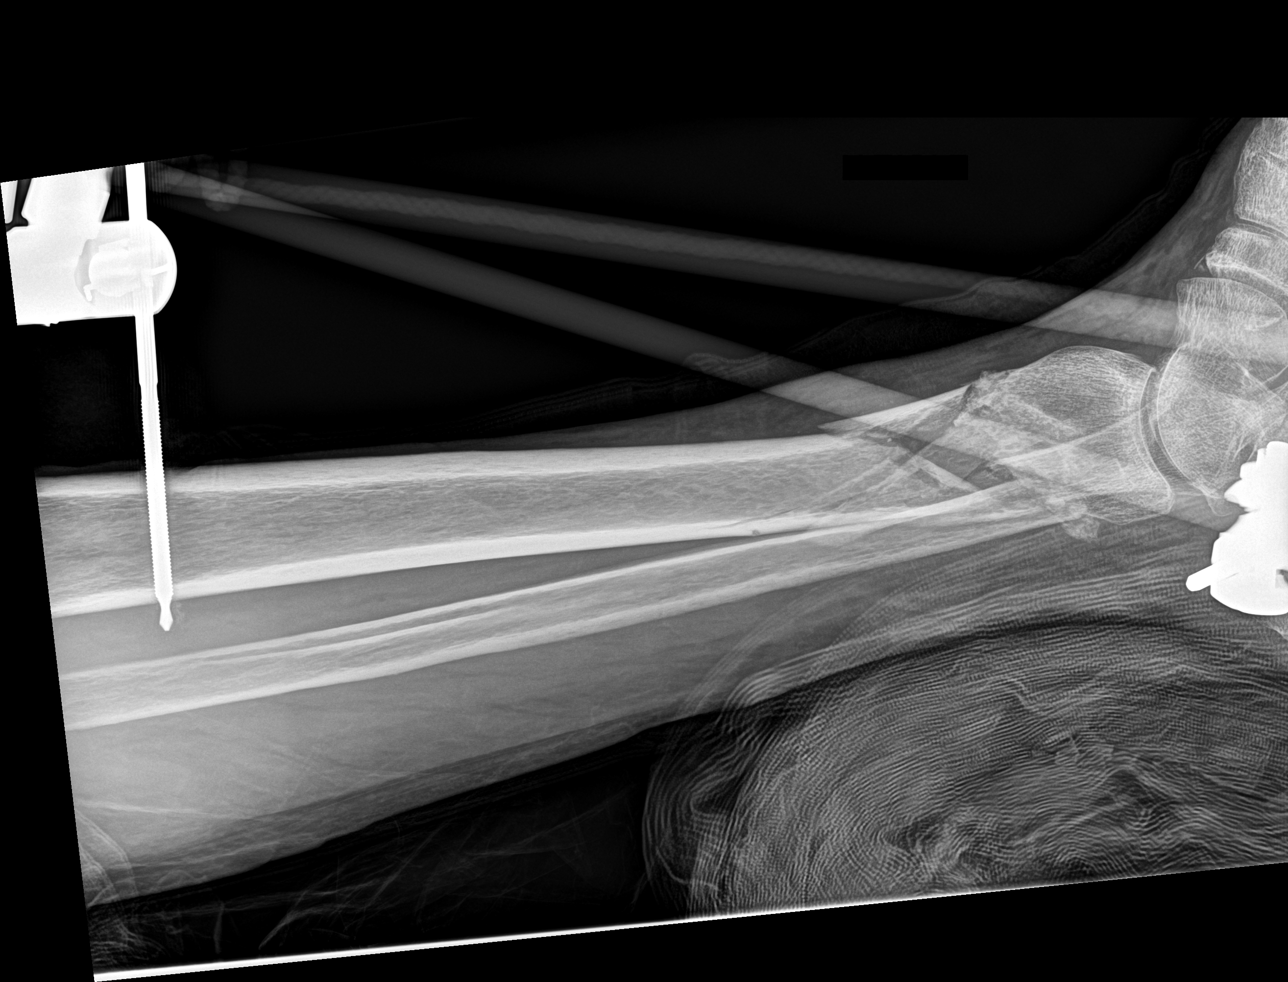

[tibia lat (2 of 2)]
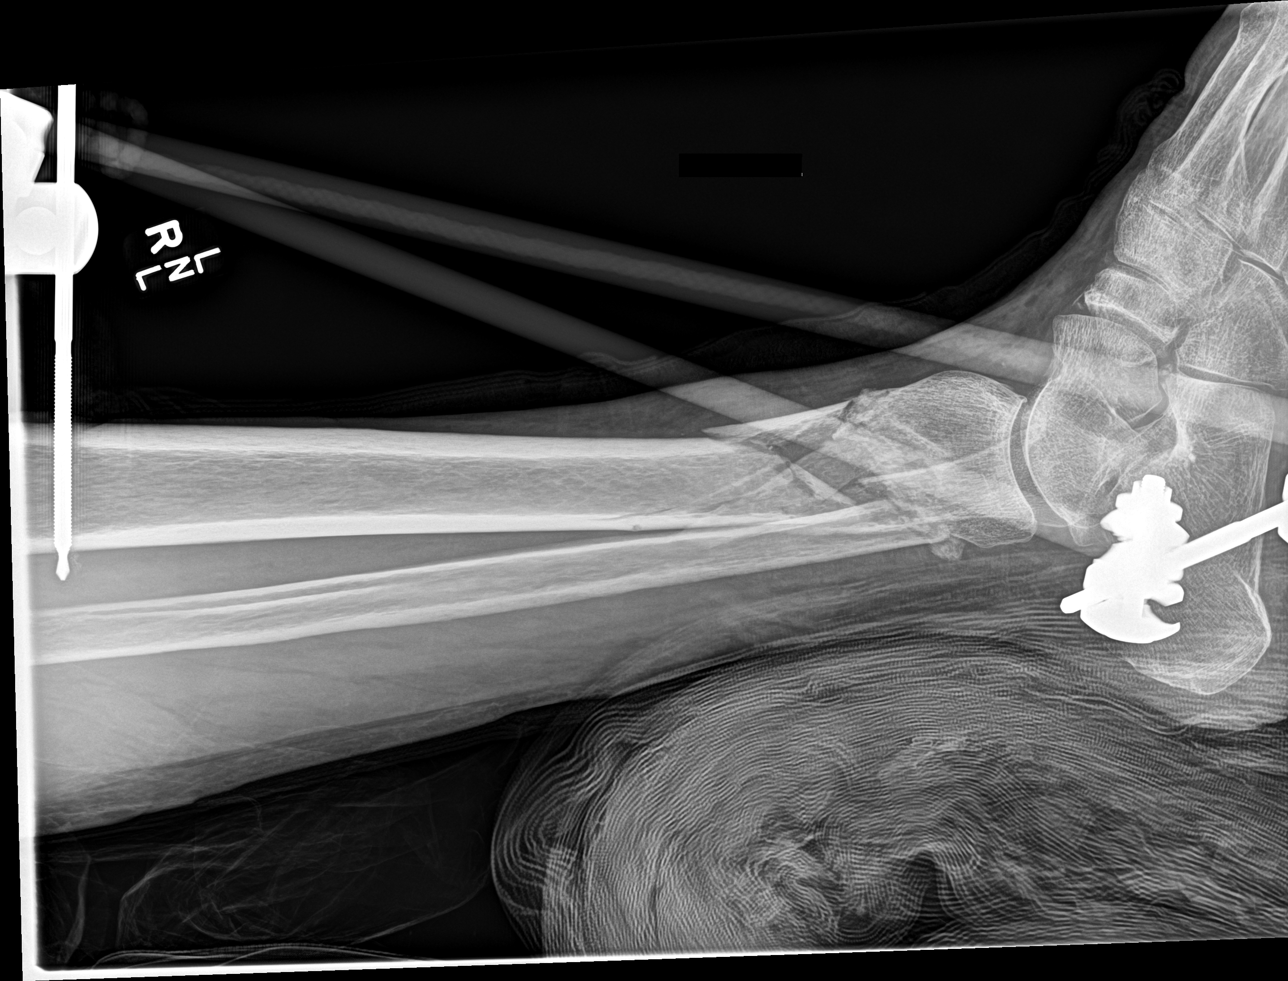

[4 of 4 positions shown; findings below may reference images not displayed]

FINDINGS: The patient is status post external fixation of the right lower
extremity. Again noted are highly comminuted fractures of the distal
tibia and fibula. The osseous alignment is improved status post
placement of the external fixator.
IMPRESSION: Status post ex fix placement with improved osseous alignment.

## 2021-08-31 IMAGING — DX DG CHEST 1V PORT
1 series · 1 of 1 positions shown · non-contrast
Comparison: September 13, 2019

CLINICAL DATA: Pain

EXAM:
PORTABLE CHEST 1 VIEW

[chest ap]
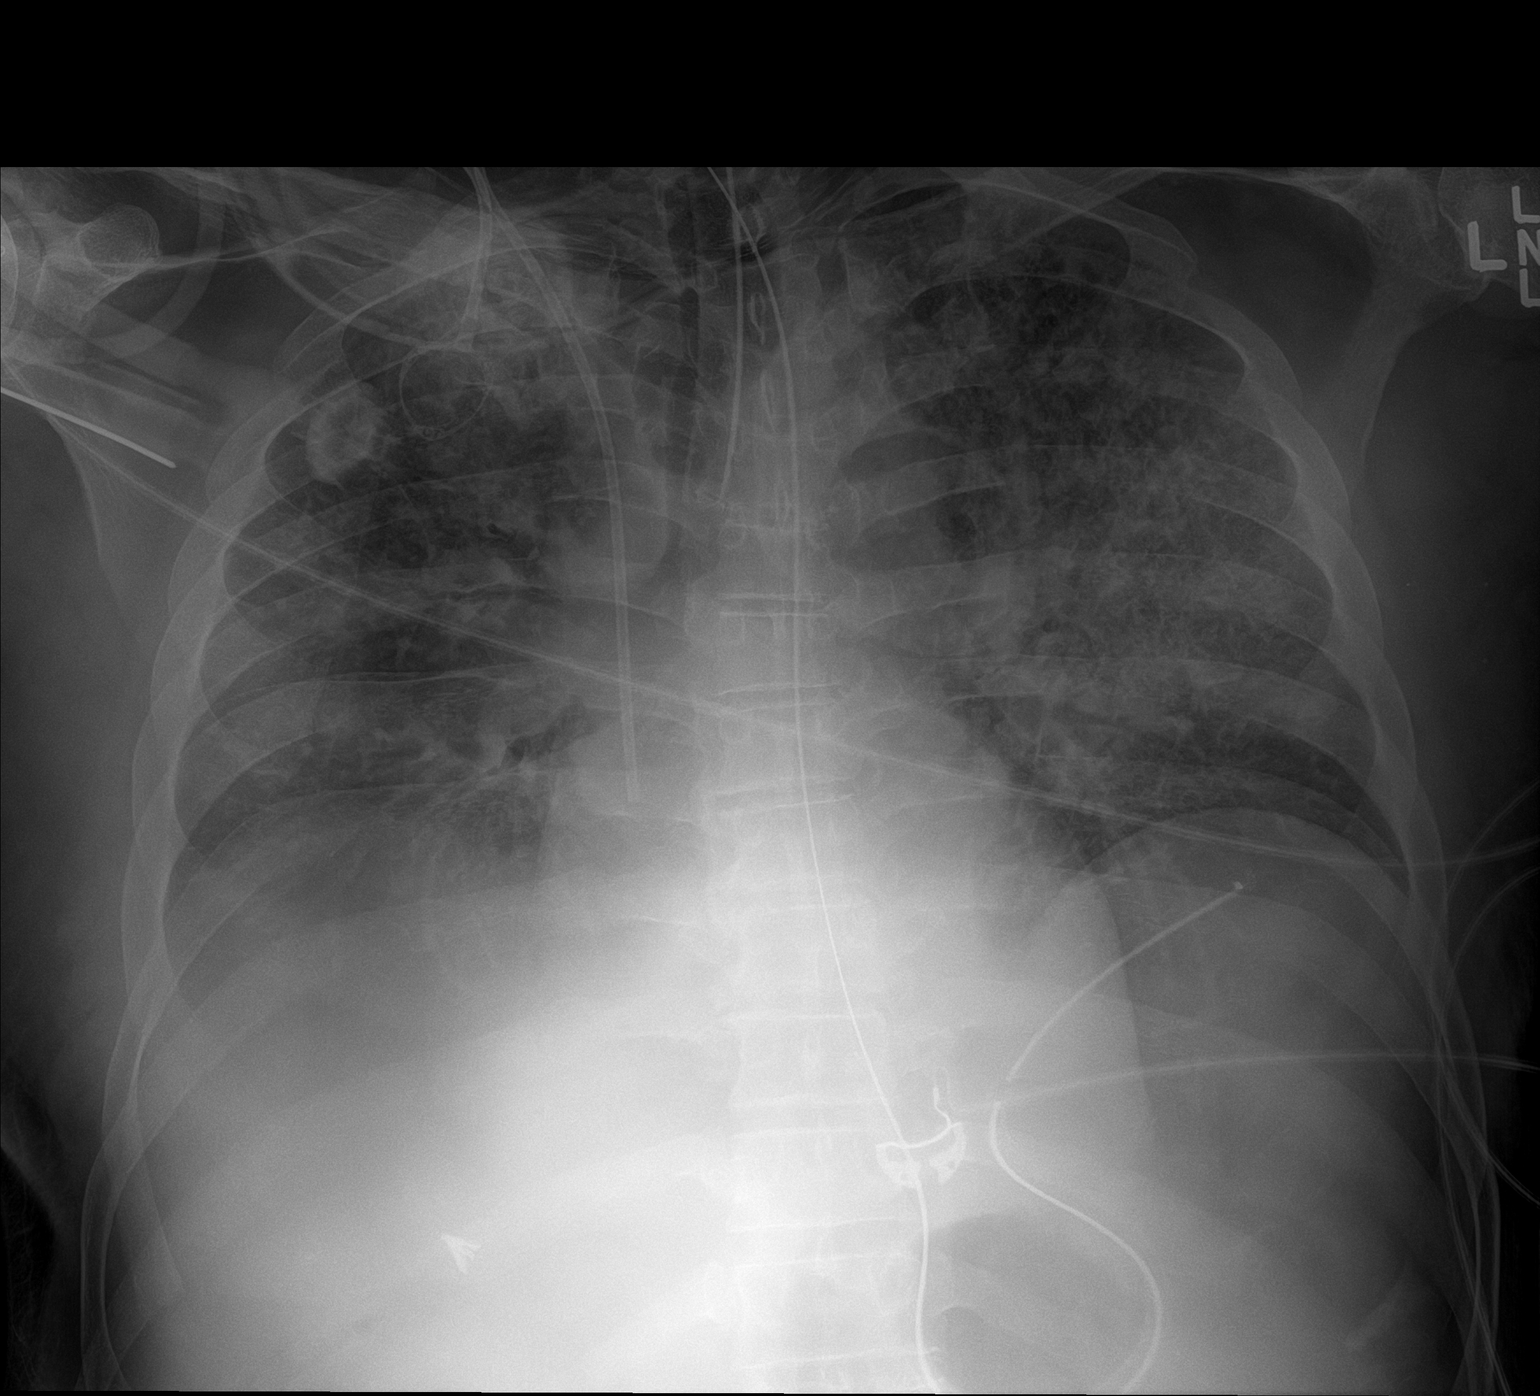

[1 of 1 positions shown; findings below may reference images not displayed]

FINDINGS: There is a right-sided Port-A-Cath with unchanged positioning of the
catheter tip. The endotracheal tube terminates above the carina by
approximately 2.2 cm. The enteric tube extends below the left
hemidiaphragm with the tip terminating over the gastric fundus.
Bilateral diffuse hazy ground-glass airspace opacities are noted and
have significantly progressed since the prior study. There are
probable bilateral pleural effusions, right greater than left. The
heart size is mildly enlarged.
IMPRESSION: 1. Lines and tubes as above.
2. Worsening hazy ground-glass airspace opacities bilaterally.
Differential considerations aspiration, pneumonia, or evolving
pulmonary contusions. Other differential considerations include
sequela of fat emboli. On the patient's recent CT, a fat-fluid level
was noted in the left common femoral vein.
3. No pneumothorax.

## 2021-08-31 IMAGING — DX DG FEMUR 2+V PORT*R*
4 series · 4 of 4 positions shown · non-contrast
Comparison: 09/13/2019

CLINICAL DATA: External fixation of the right lower extremity

EXAM:
RIGHT FEMUR PORTABLE 2 VIEW

[femur ap (1 of 2)]
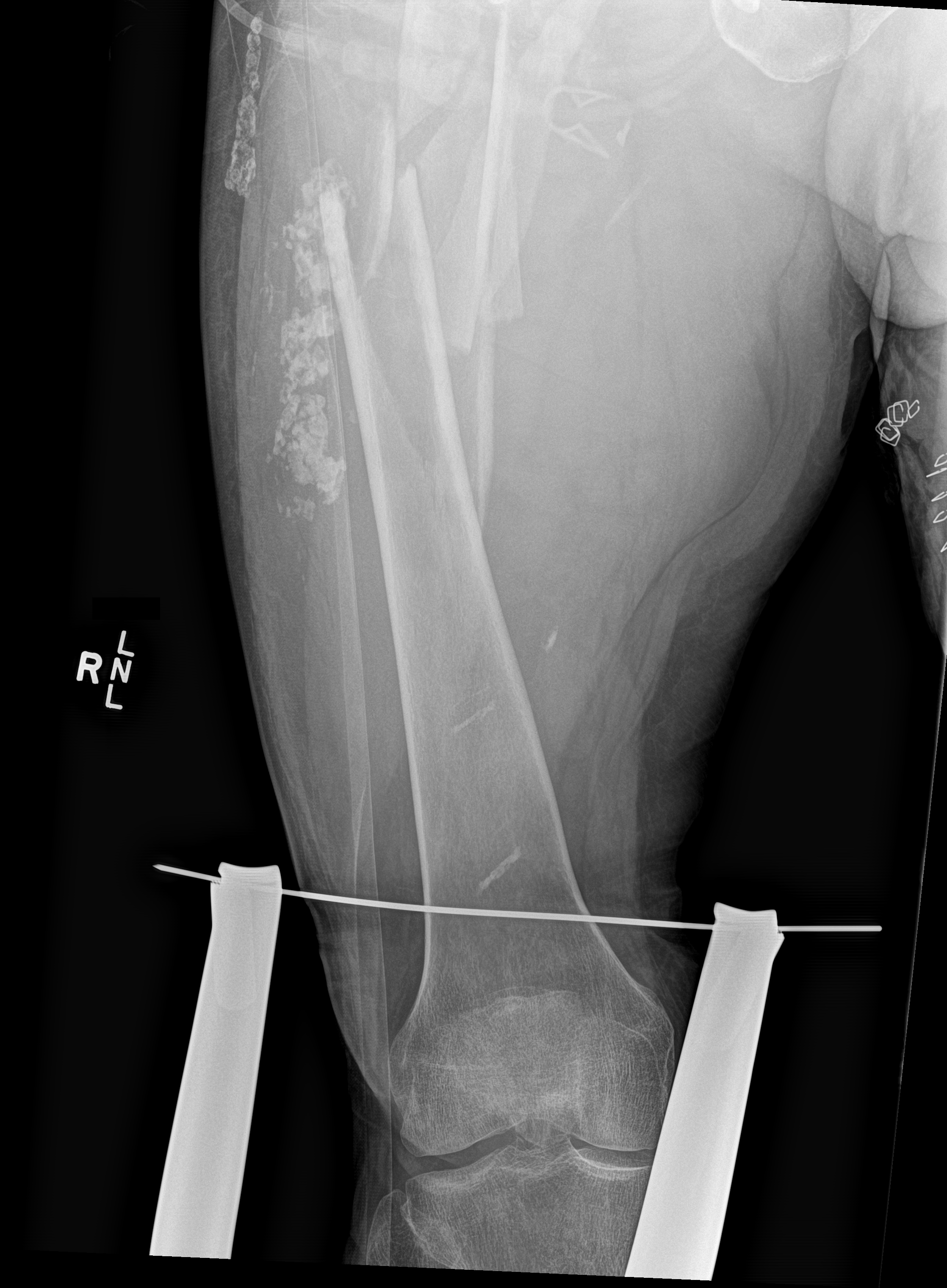

[femur ap (2 of 2)]
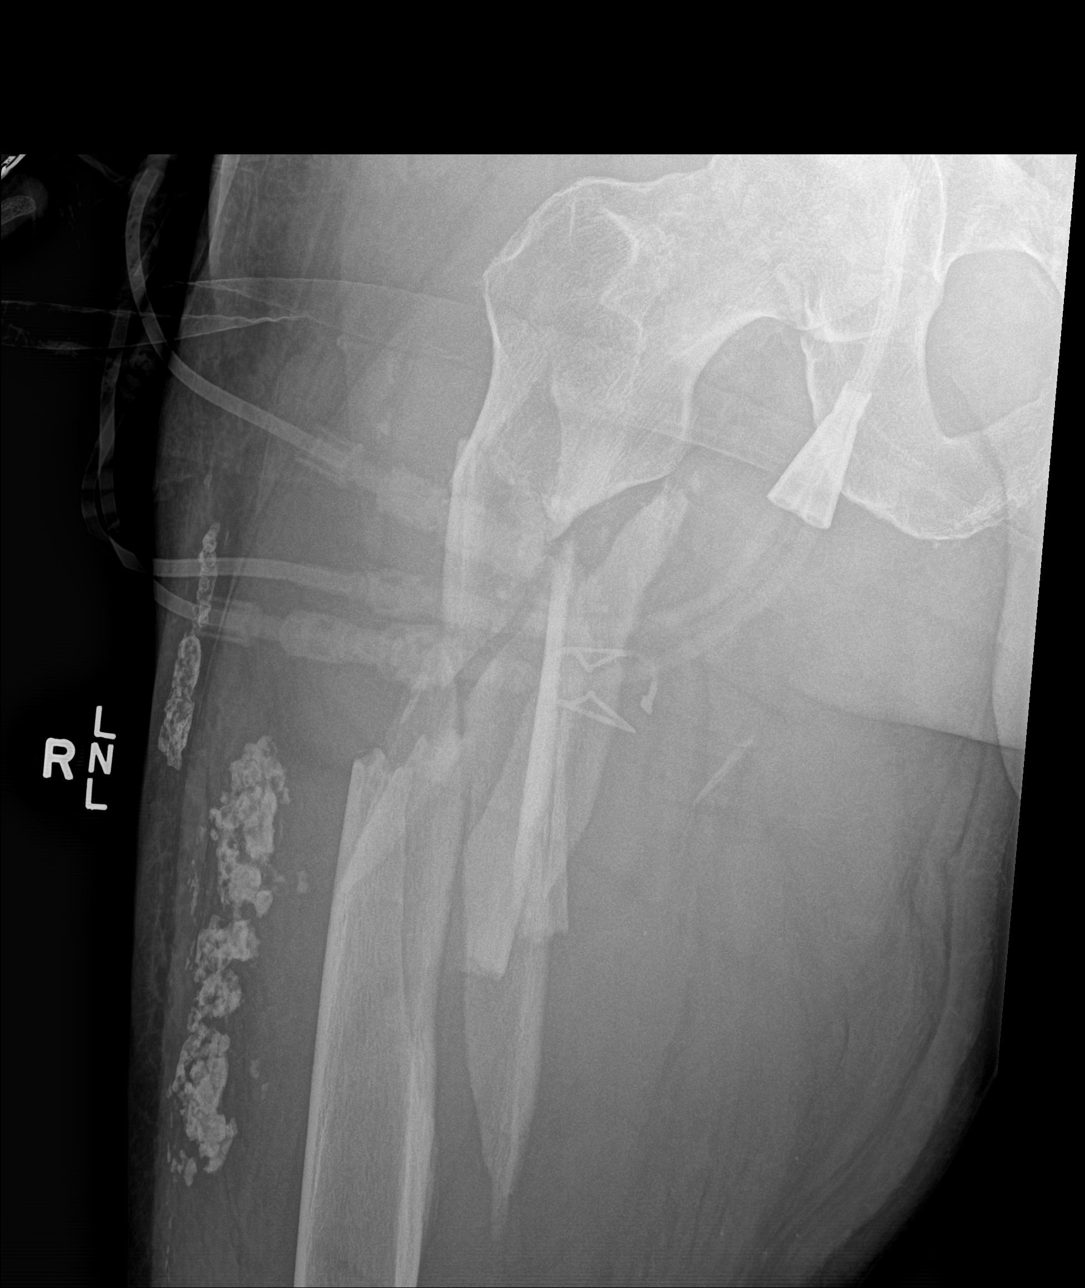

[femur lat (1 of 2)]
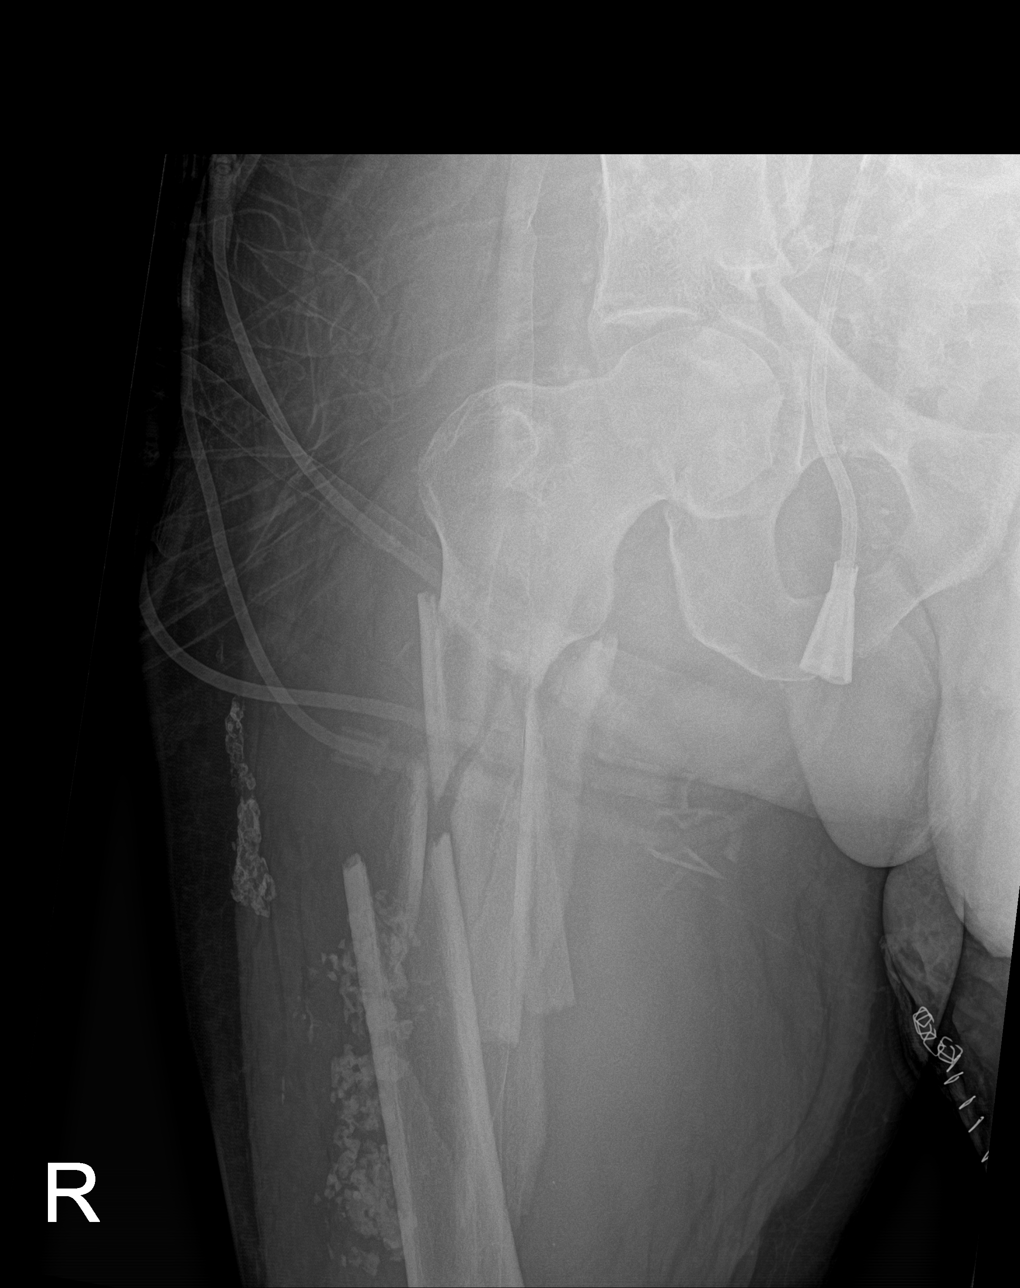

[femur lat (2 of 2)]
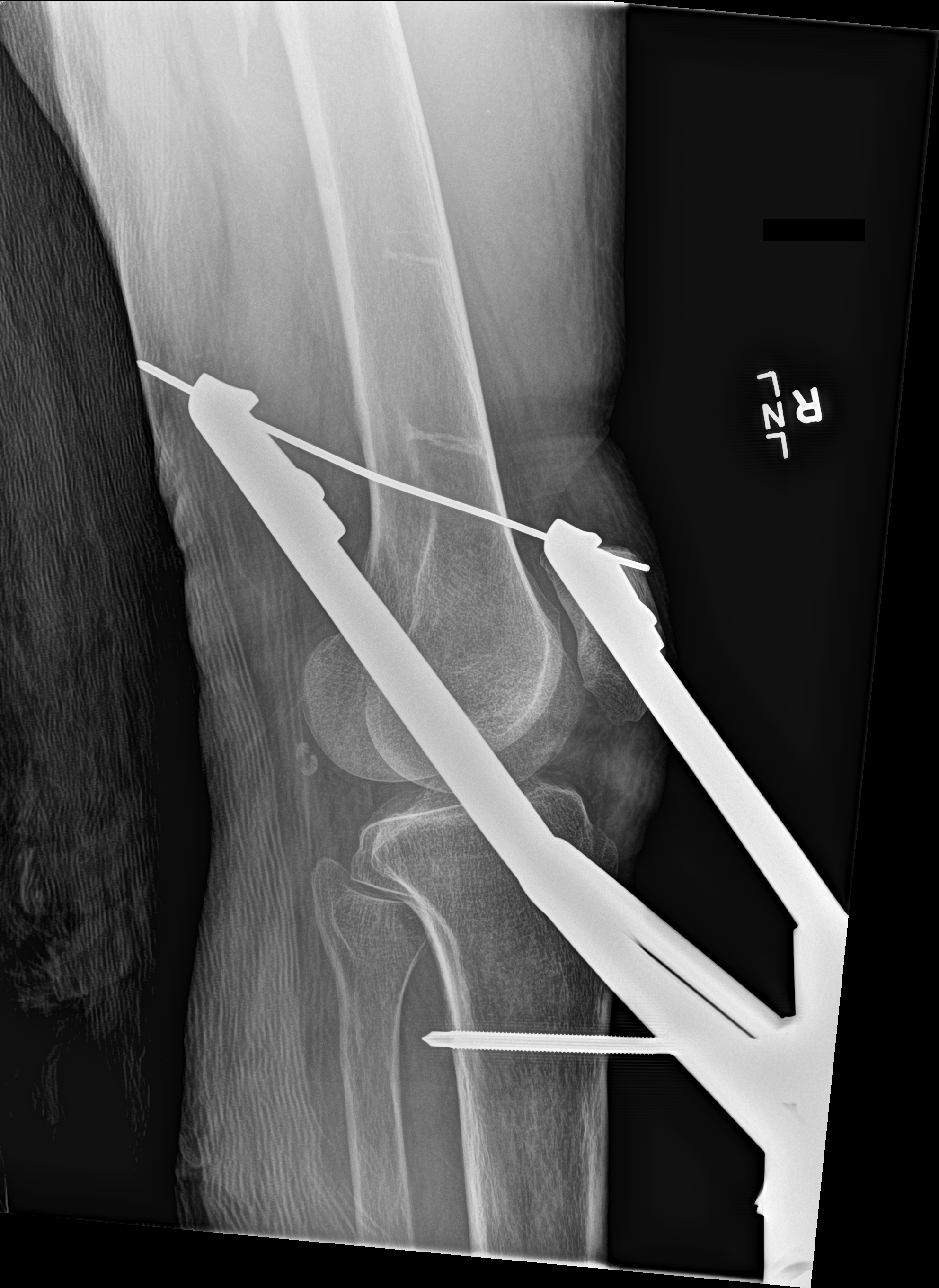

[4 of 4 positions shown; findings below may reference images not displayed]

FINDINGS: Again noted is a highly comminuted fracture of the proximal right
femoral diaphysis. The alignment is slightly improved from prior
study. A right-sided central venous catheter is again noted.
Subcutaneous calcifications are noted. Soft tissue edema is noted.
IMPRESSION: Highly comminuted fracture of the proximal right femur.

## 2021-09-01 IMAGING — DX DG CHEST 1V PORT
1 series · 1 of 1 positions shown · non-contrast
Comparison: September 14, 2019

CLINICAL DATA: Acute pain due to trauma

EXAM:
PORTABLE CHEST 1 VIEW

[chest ap]
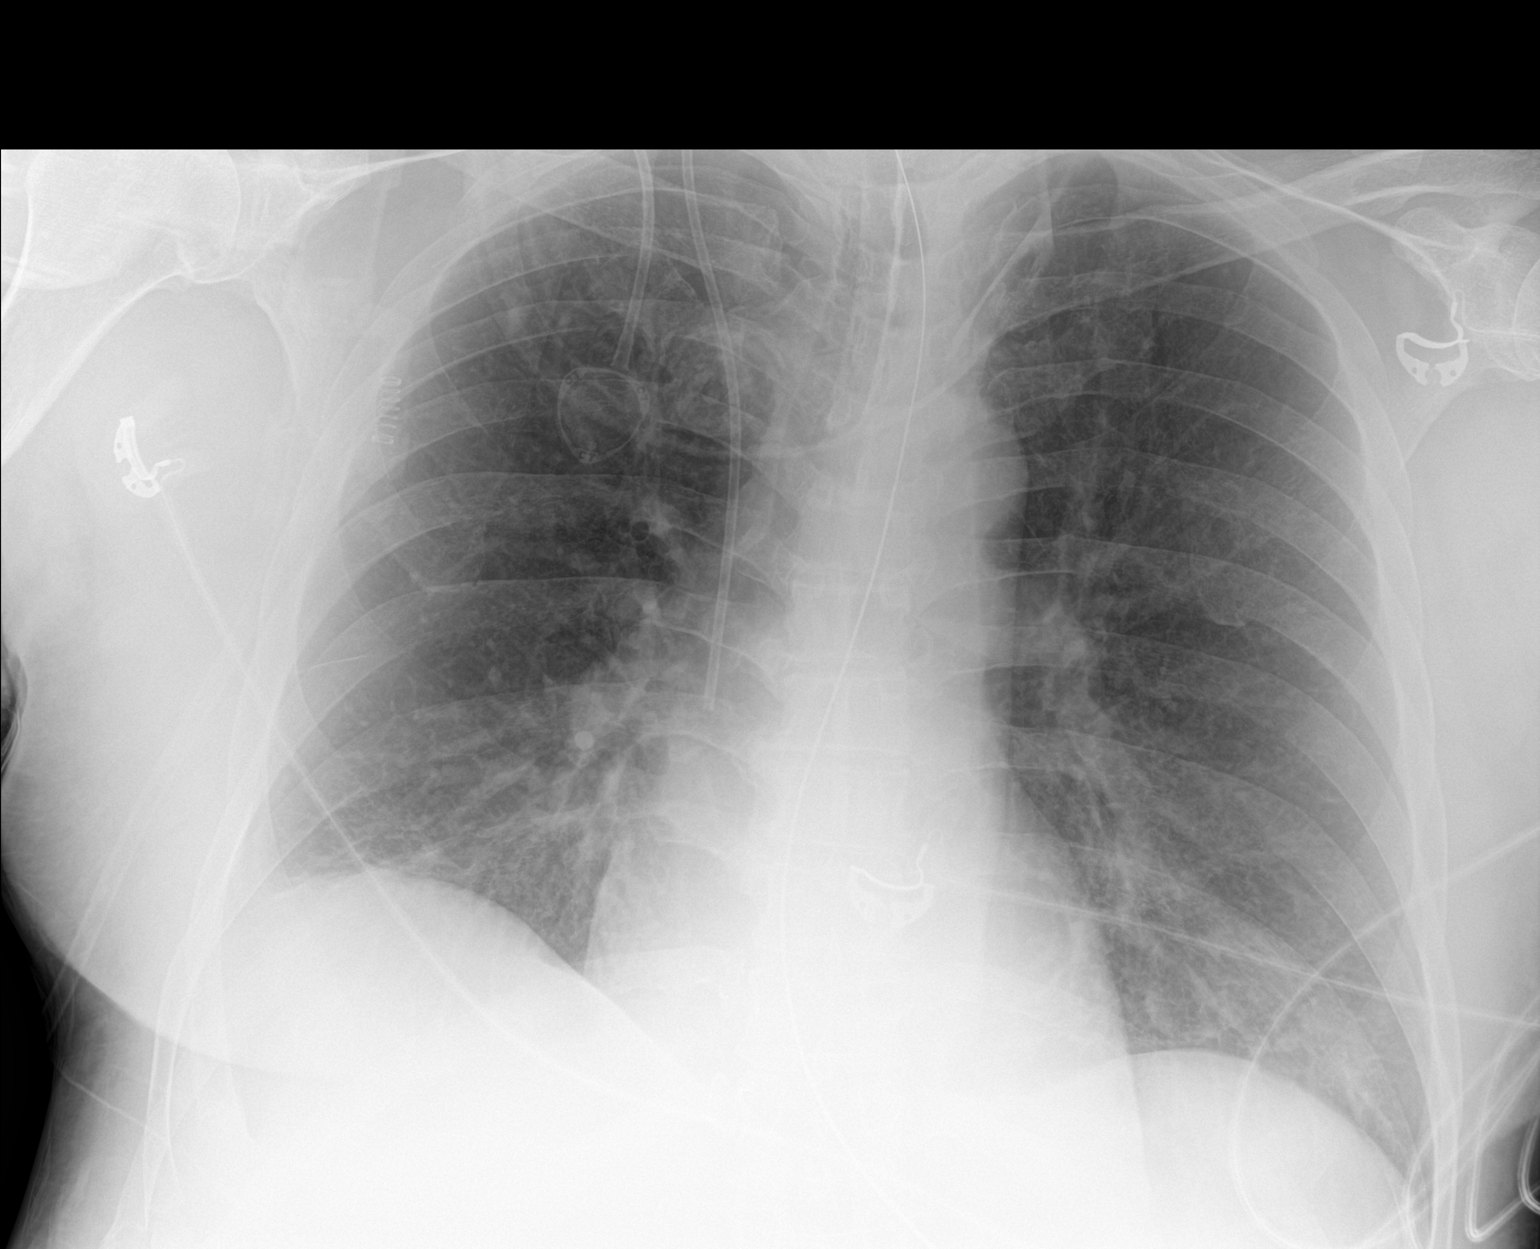

[1 of 1 positions shown; findings below may reference images not displayed]

FINDINGS: The right-sided Port-A-Cath is stable in positioning. The
endotracheal tube terminates approximately 3.4 cm above the carina.
The enteric tube extends below the hemidiaphragm. There is no
pneumothorax. There is elevation of the right hemidiaphragm with
bibasilar atelectasis. There arm proving hazy airspace opacities
bilaterally.
IMPRESSION: 1. Lines and tubes as above.
2. Improving airspace opacities bilaterally.
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
## 2021-09-02 IMAGING — RF DG FEMUR 2+V*L*
1 series · 7 of 7 positions shown · non-contrast
Comparison: 09/14/2019

CLINICAL DATA: Femur fracture

EXAM:
LEFT FEMUR 2 VIEWS

[Series 1: run · 7 of 7 slices shown]
[im 1/7]
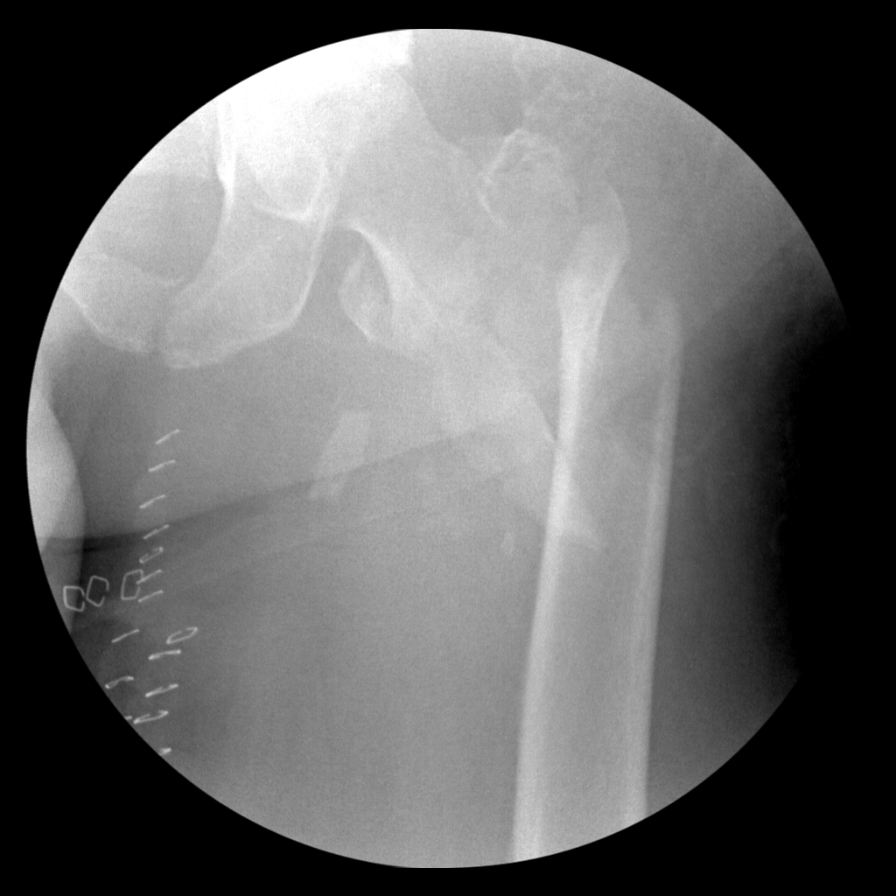
[im 2/7]
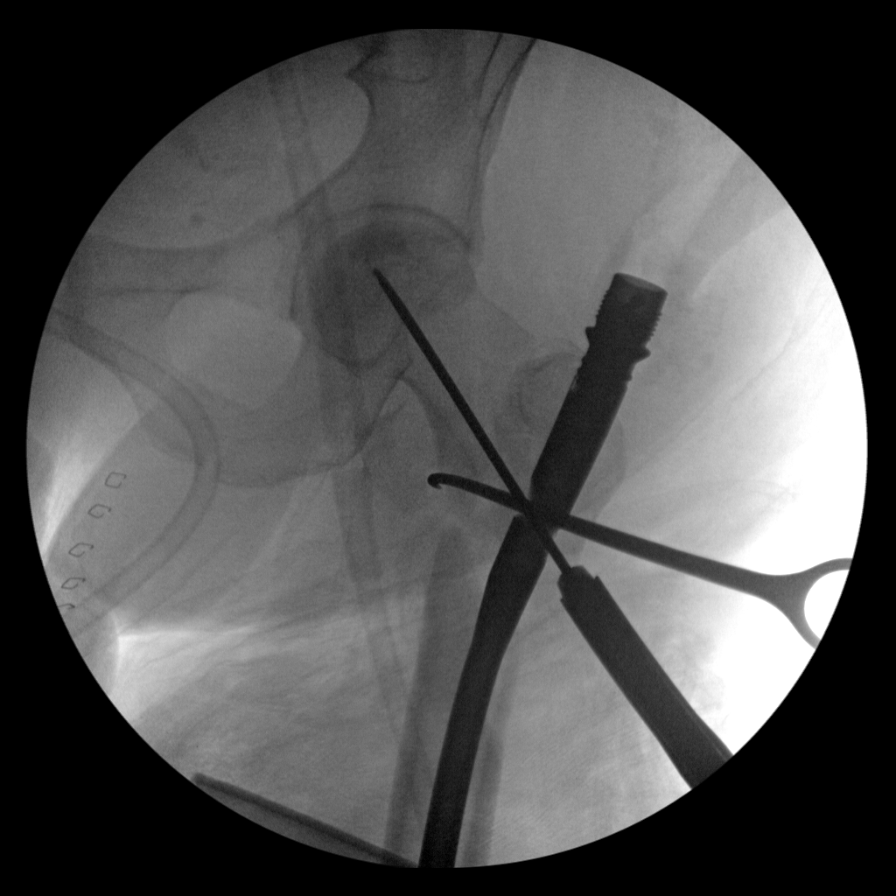
[im 3/7]
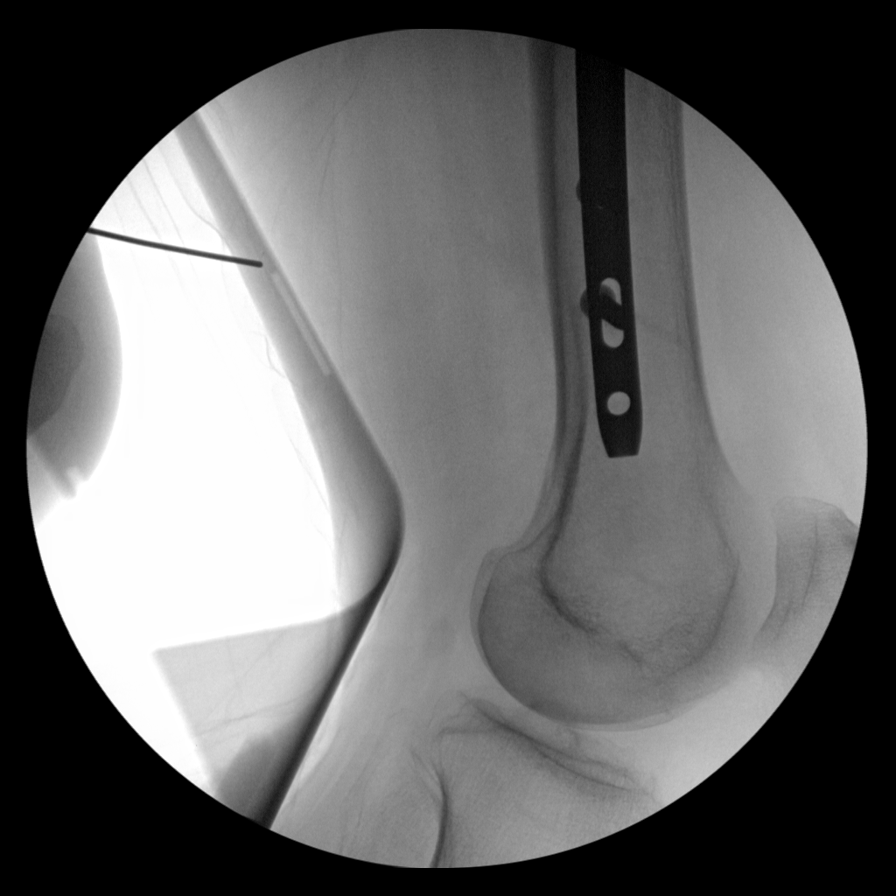
[im 4/7]
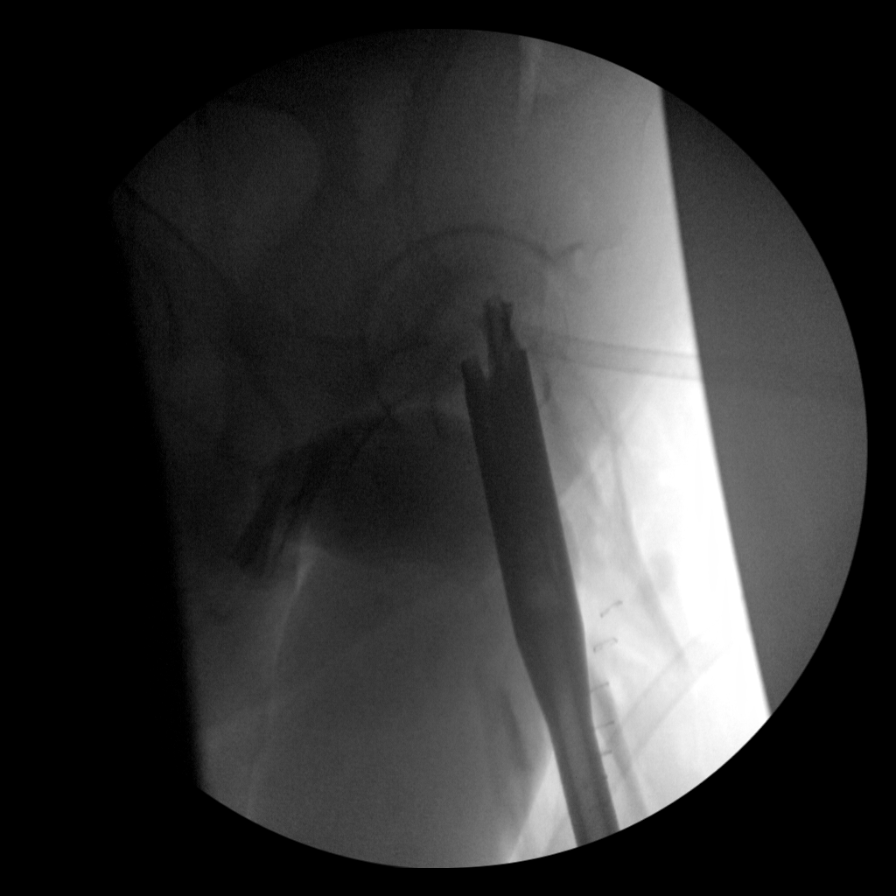
[im 5/7]
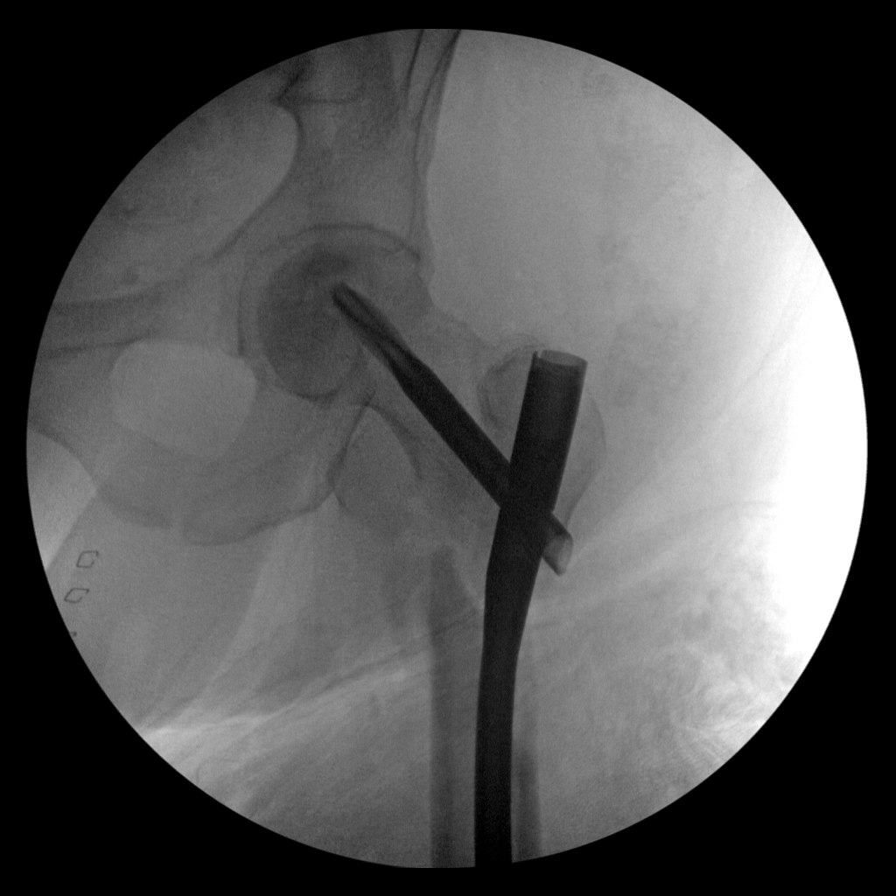
[im 6/7]
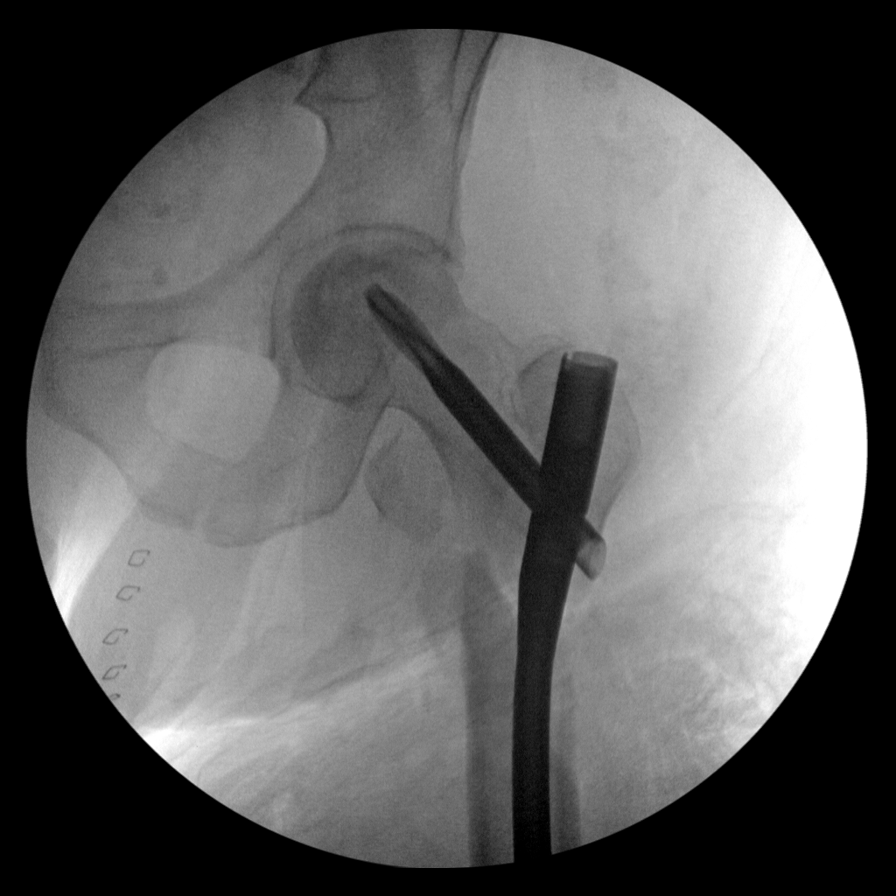
[im 7/7]
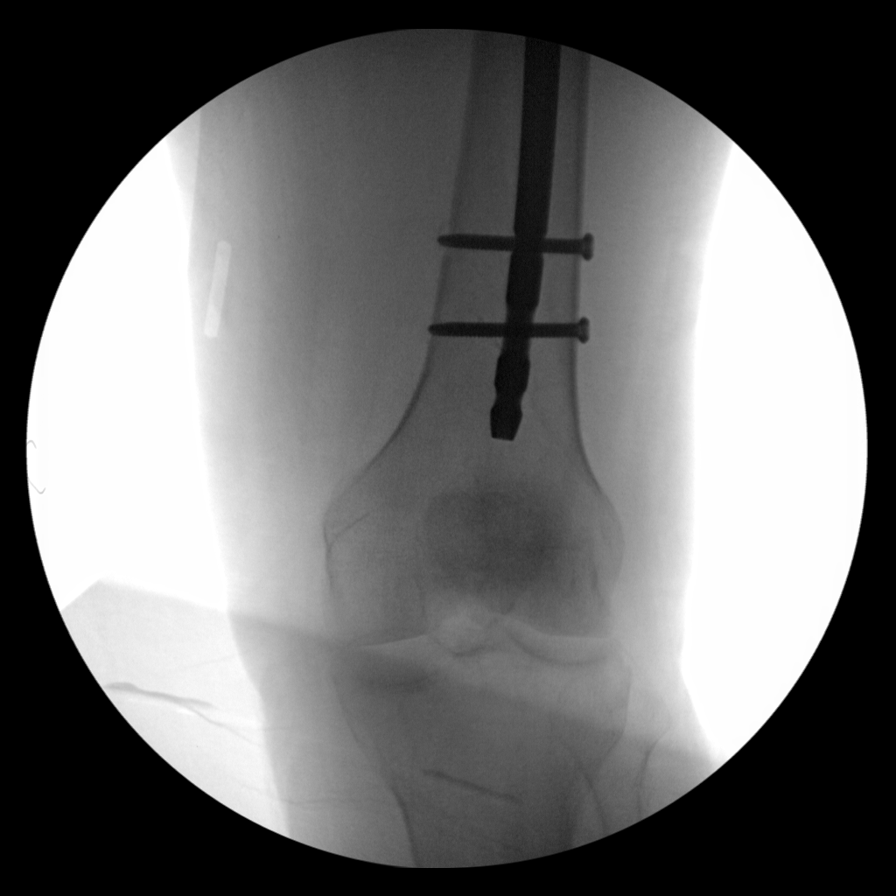

[7 of 7 positions shown; findings below may reference images not displayed]

FINDINGS: The patient has undergone ORIF of the left femur. Again noted is a
highly comminuted fracture. The osseous alignment is significantly
improved status post ORIF.
IMPRESSION: Improved osseous alignment status post ORIF of the left femur.

## 2021-09-02 IMAGING — RF DG C-ARM 1-60 MIN
1 series · 15 of 24 positions shown · non-contrast
Comparison: 09/14/2019

CLINICAL DATA: ORIF of the left femur and pelvis.

EXAM:
DG C-ARM 1-60 MIN
FLUOROSCOPY TIME:  Fluoroscopy Time:  12 minutes and 23 seconds
Number of Acquired Spot Images: 46

[Series 1: run · 15 of 31 slices shown]
[im 1/31]
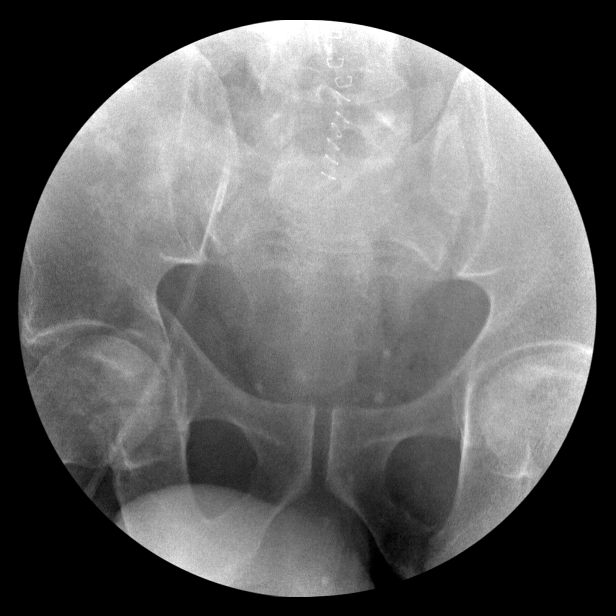
[im 3/31]
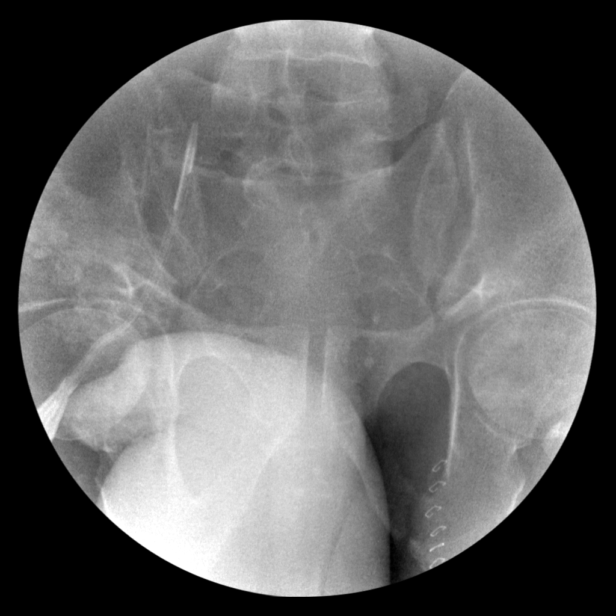
[im 6/31]
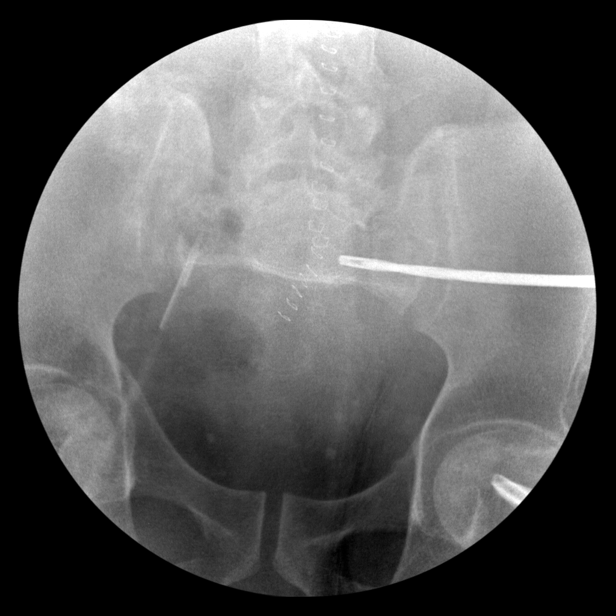
[im 7/31]
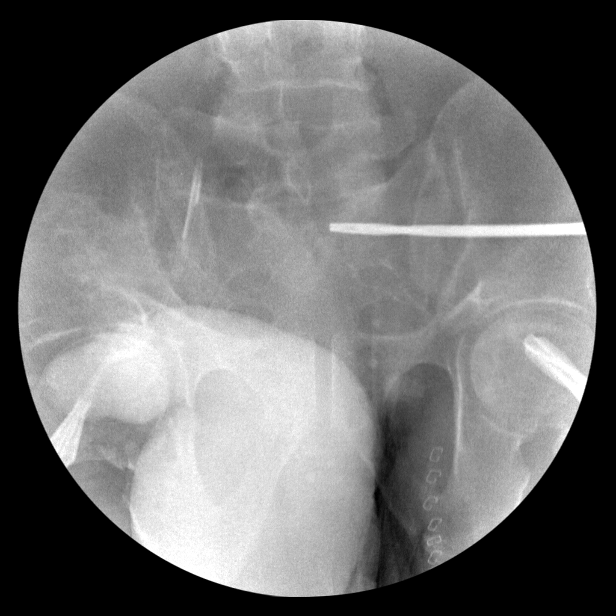
[im 10/31]
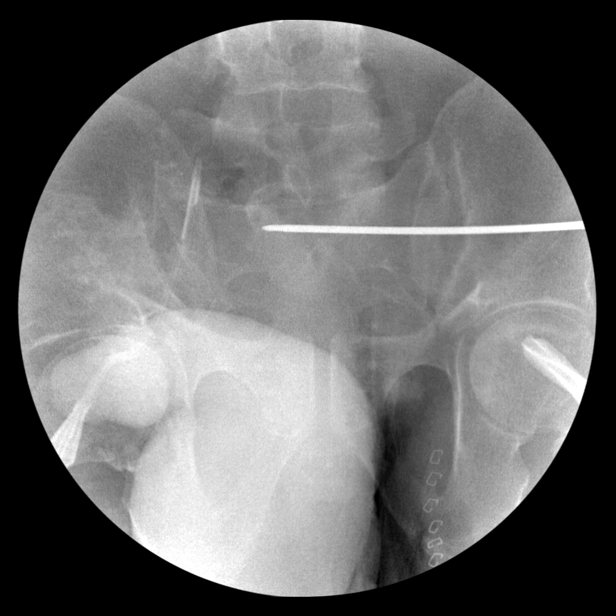
[im 11/31]
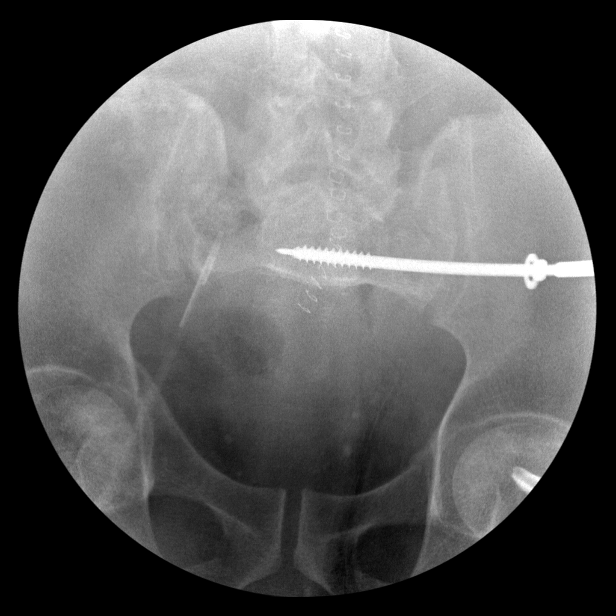
[im 14/31]
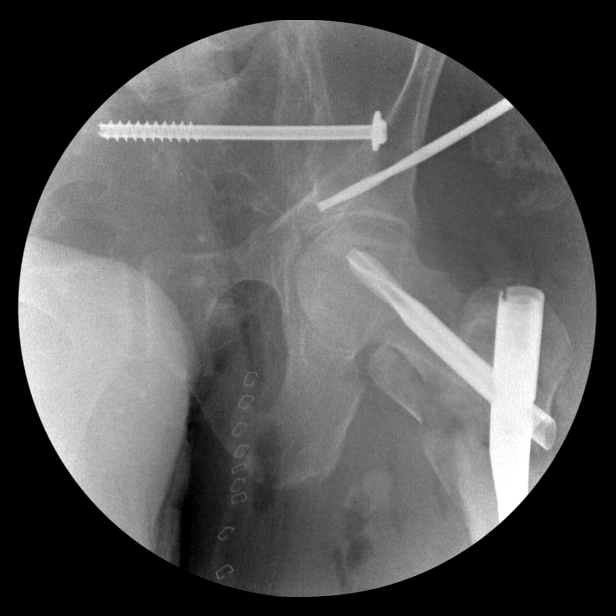
[im 16/31]
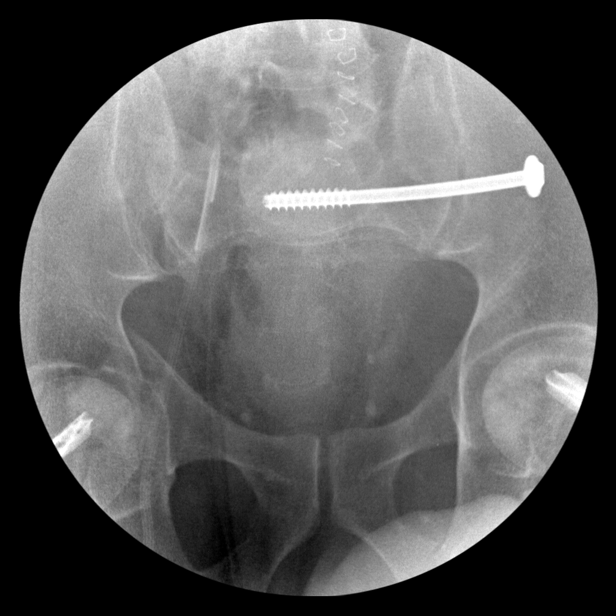
[im 17/31]
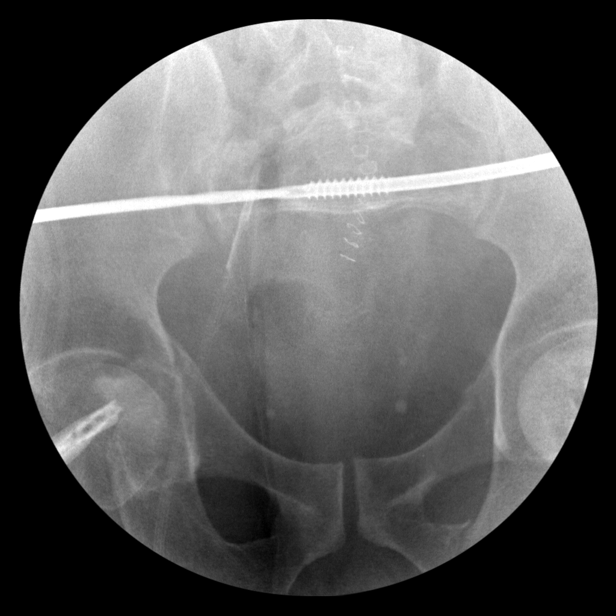
[im 20/31]
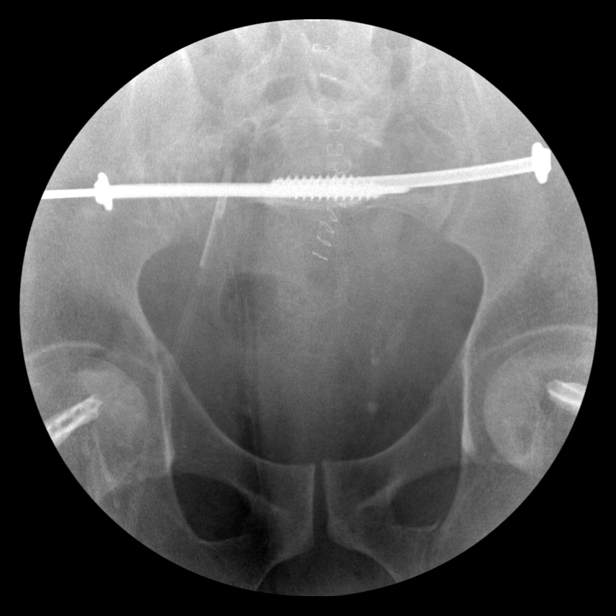
[im 21/31]
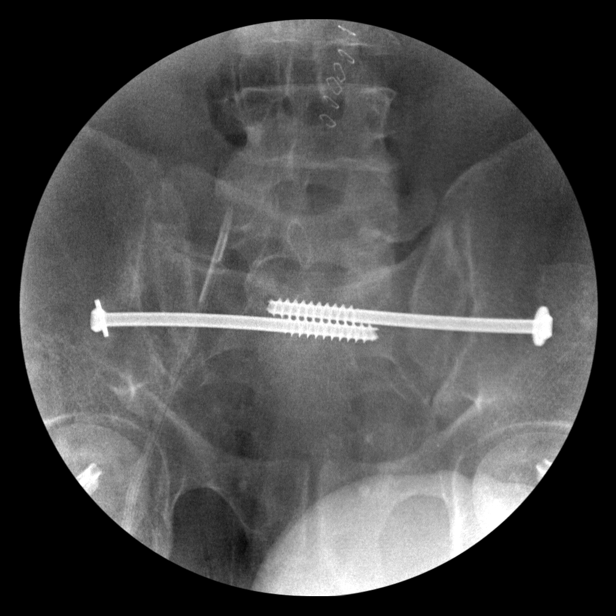
[im 24/31]
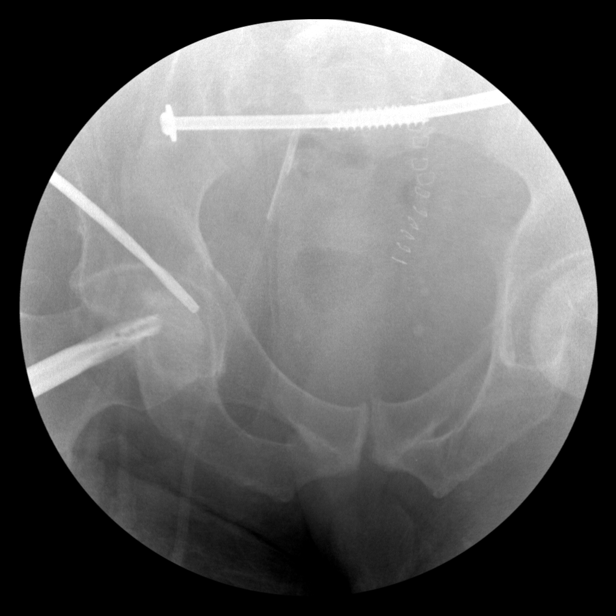
[im 27/31]
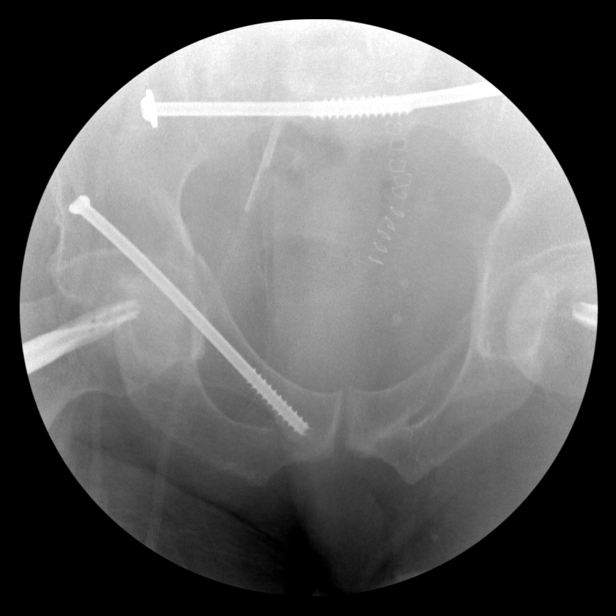
[im 28/31]
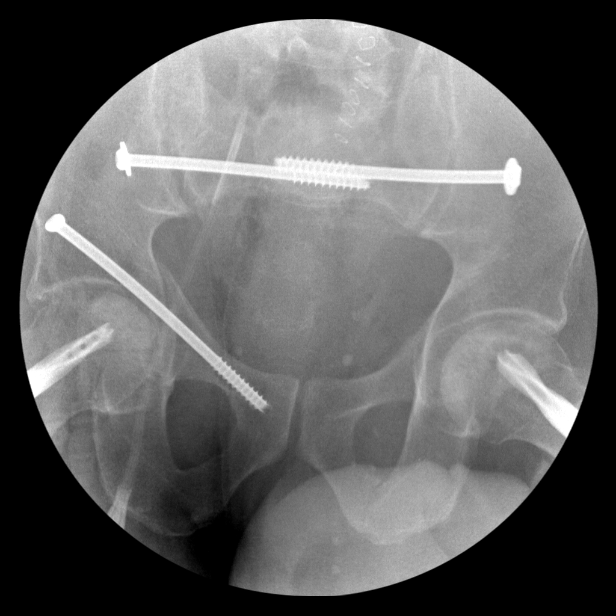
[im 31/31]
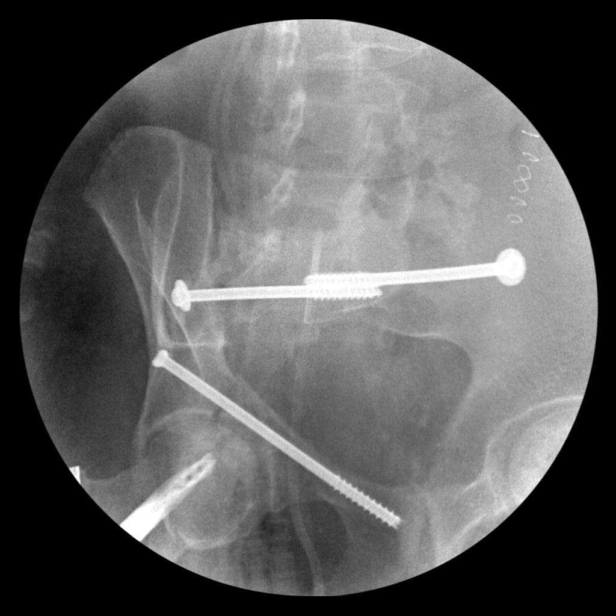

[15 of 24 positions shown; findings below may reference images not displayed]

FINDINGS: The patient has undergone ORIF of the sacrum and bilateral proximal
femurs as well as the right acetabulum. The osseous alignment is
improved. A right-sided common femoral vein central venous catheter
is noted. Surgical clips project over the low midline pelvis.
IMPRESSION: Status post ORIF the pelvis and bilateral femurs. The osseous
alignment is improved.

## 2021-09-02 IMAGING — RF DG PELVIS 1-2V
1 series · 15 of 24 positions shown · non-contrast
Comparison: 09/14/2019

CLINICAL DATA: ORIF

EXAM:
PELVIS - 1-2 VIEW

[Series 1: run · 15 of 31 slices shown]
[im 1/31]
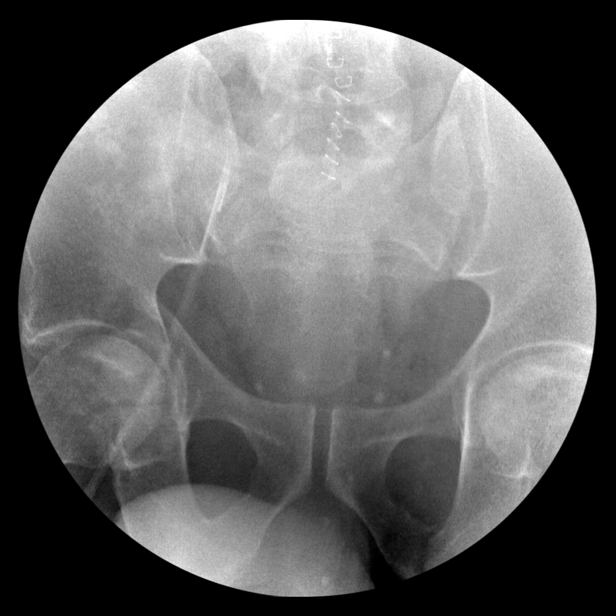
[im 3/31]
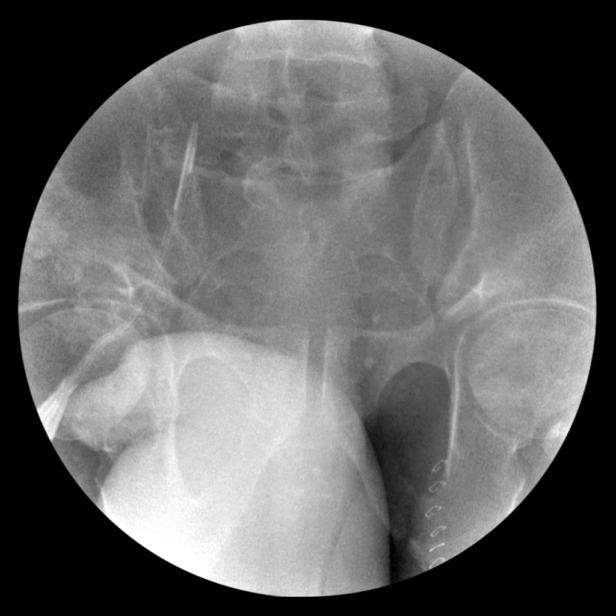
[im 6/31]
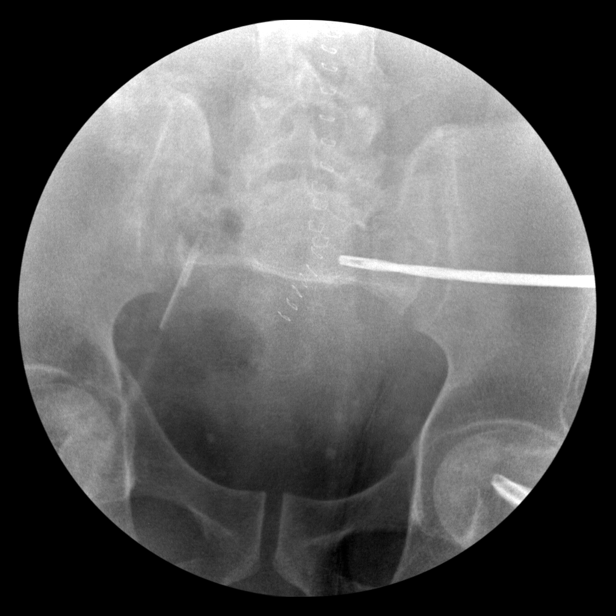
[im 7/31]
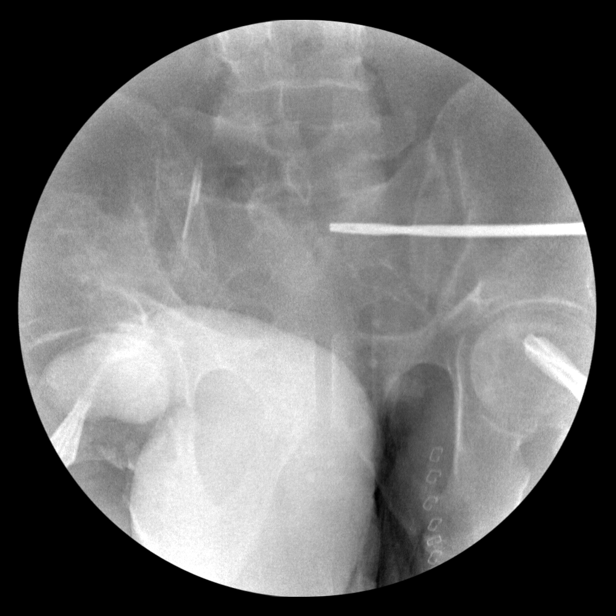
[im 10/31]
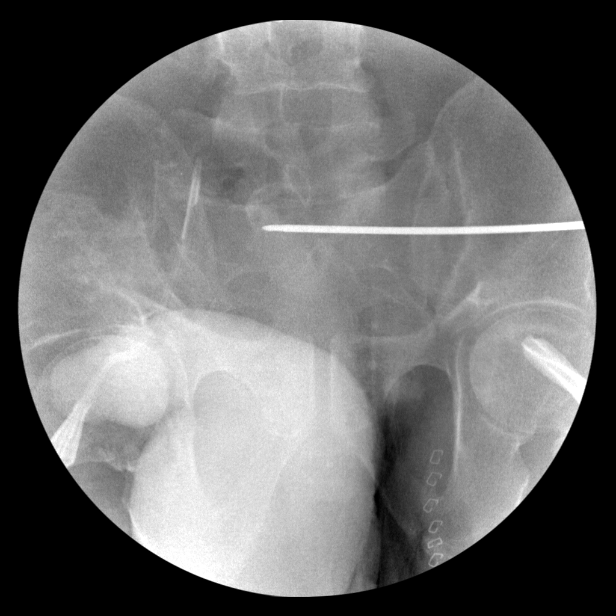
[im 11/31]
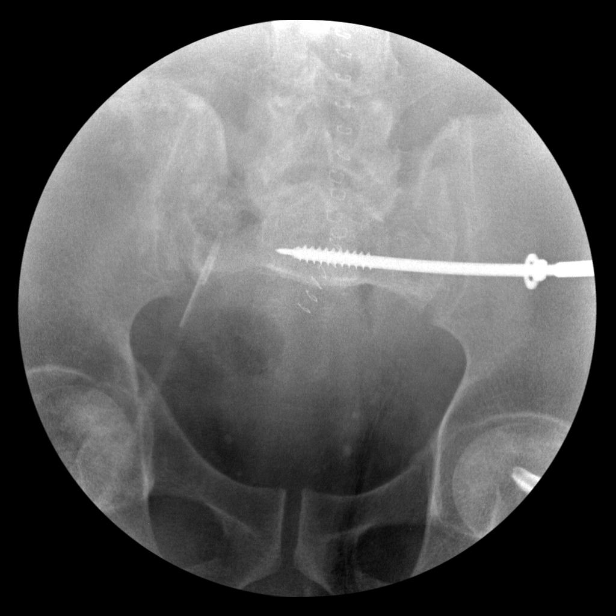
[im 14/31]
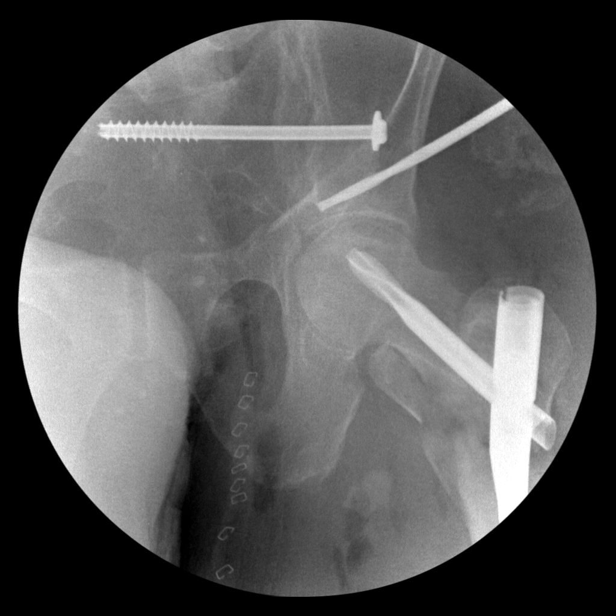
[im 16/31]
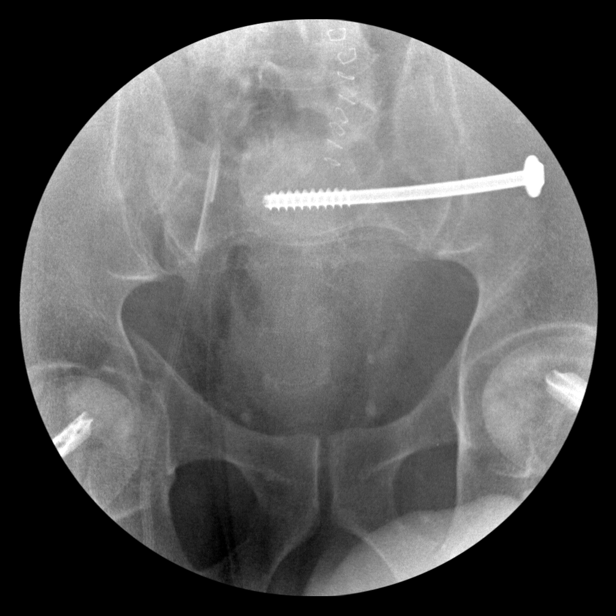
[im 17/31]
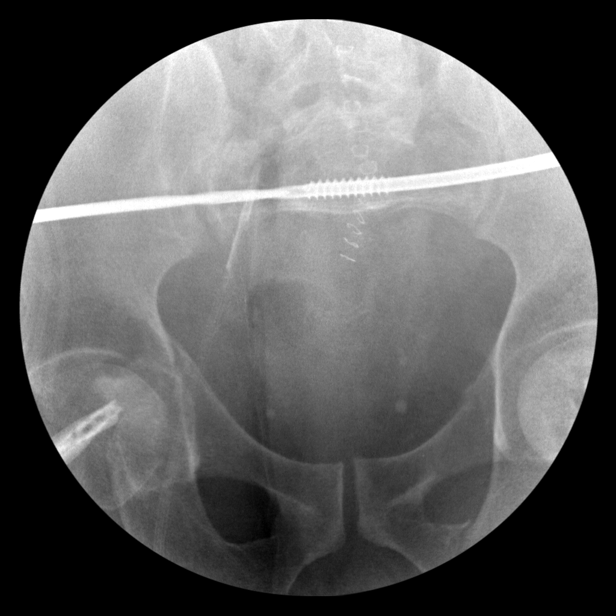
[im 20/31]
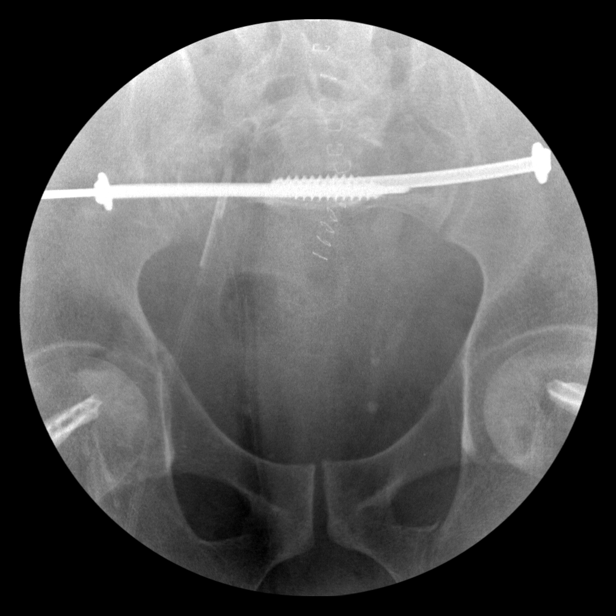
[im 21/31]
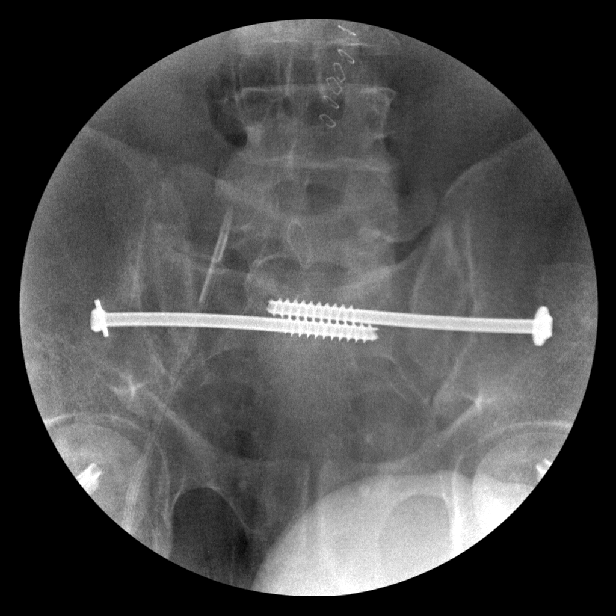
[im 24/31]
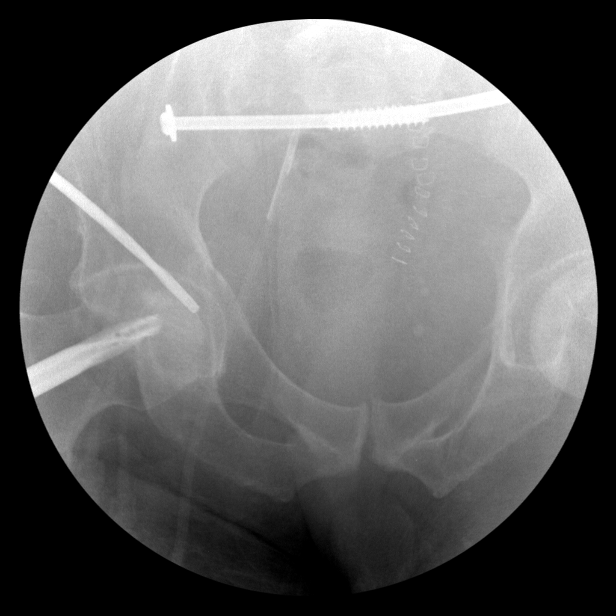
[im 27/31]
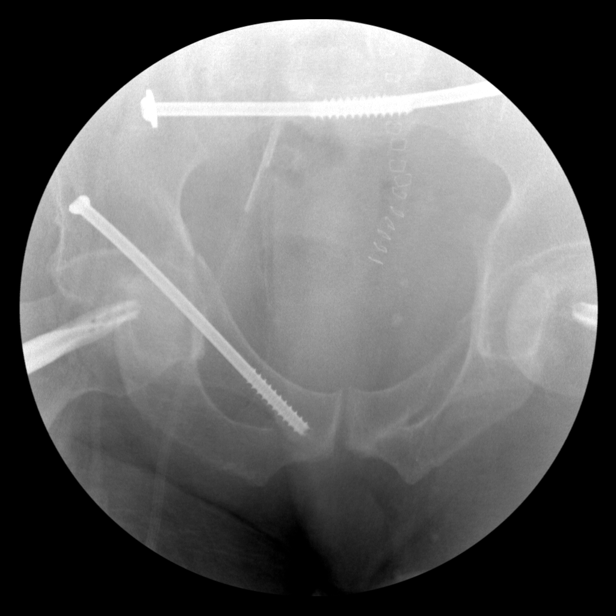
[im 28/31]
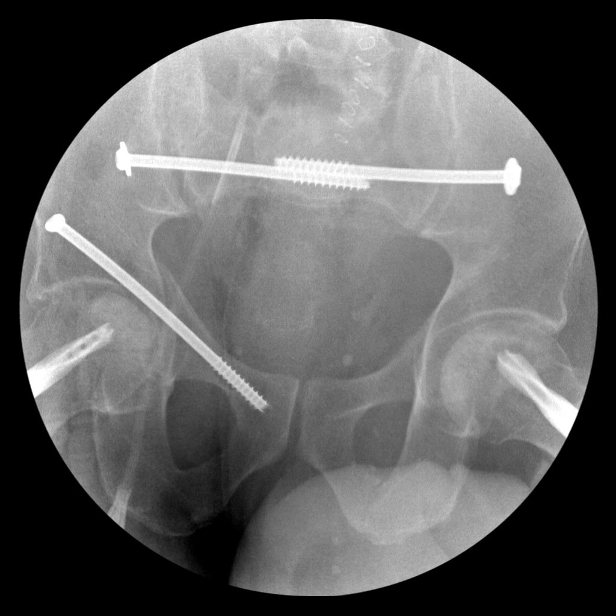
[im 31/31]
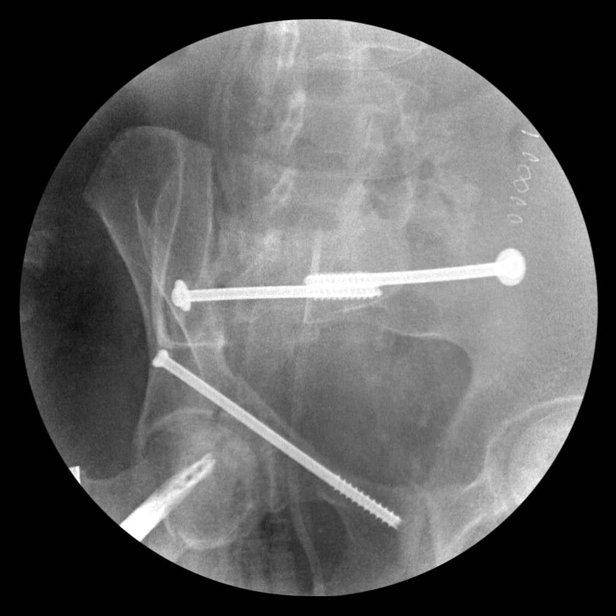

[15 of 24 positions shown; findings below may reference images not displayed]

FINDINGS: The patient has undergone ORIF of the sacrum. The alignment is
improved. Again noted is widening of the bilateral sacroiliac
joints. The patient is status post ORIF of the right acetabulum with
improved alignment.
IMPRESSION: Status post ORIF the pelvis with improved osseous alignment.

## 2021-09-03 IMAGING — DX DG PELVIS 3+V JUDET
5 series · 5 of 5 positions shown · non-contrast
Comparison: 09/14/2019

CLINICAL DATA: Postop pelvic fixation.

EXAM:
JUDET PELVIS - 3+ VIEW

[pelvis ap]
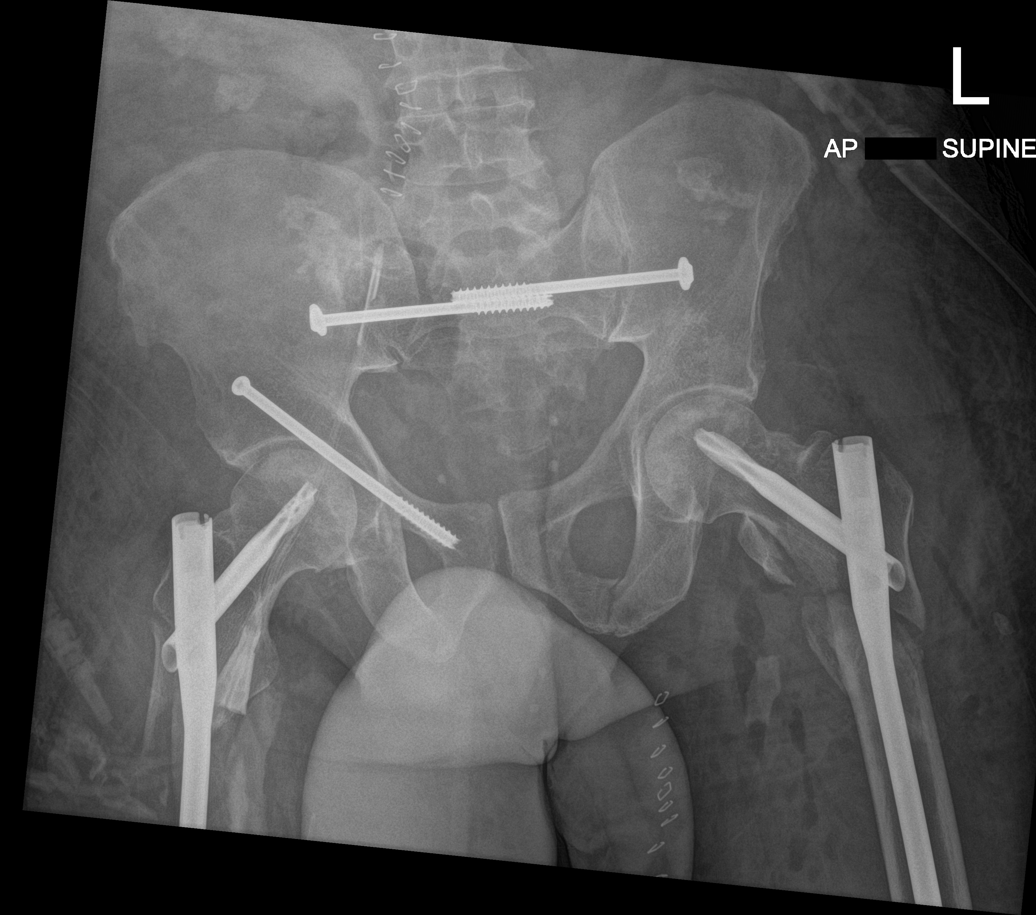

[pelvis obl (1 of 4)]
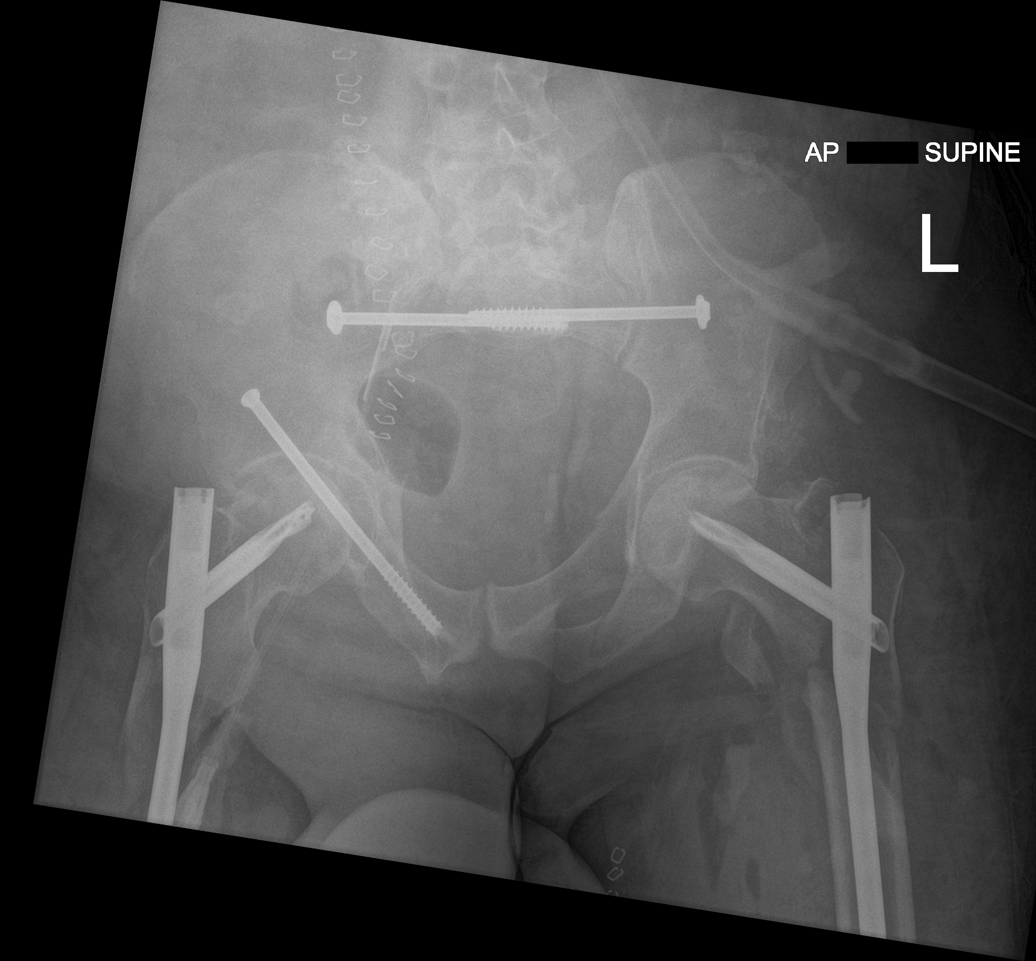

[pelvis obl (2 of 4)]
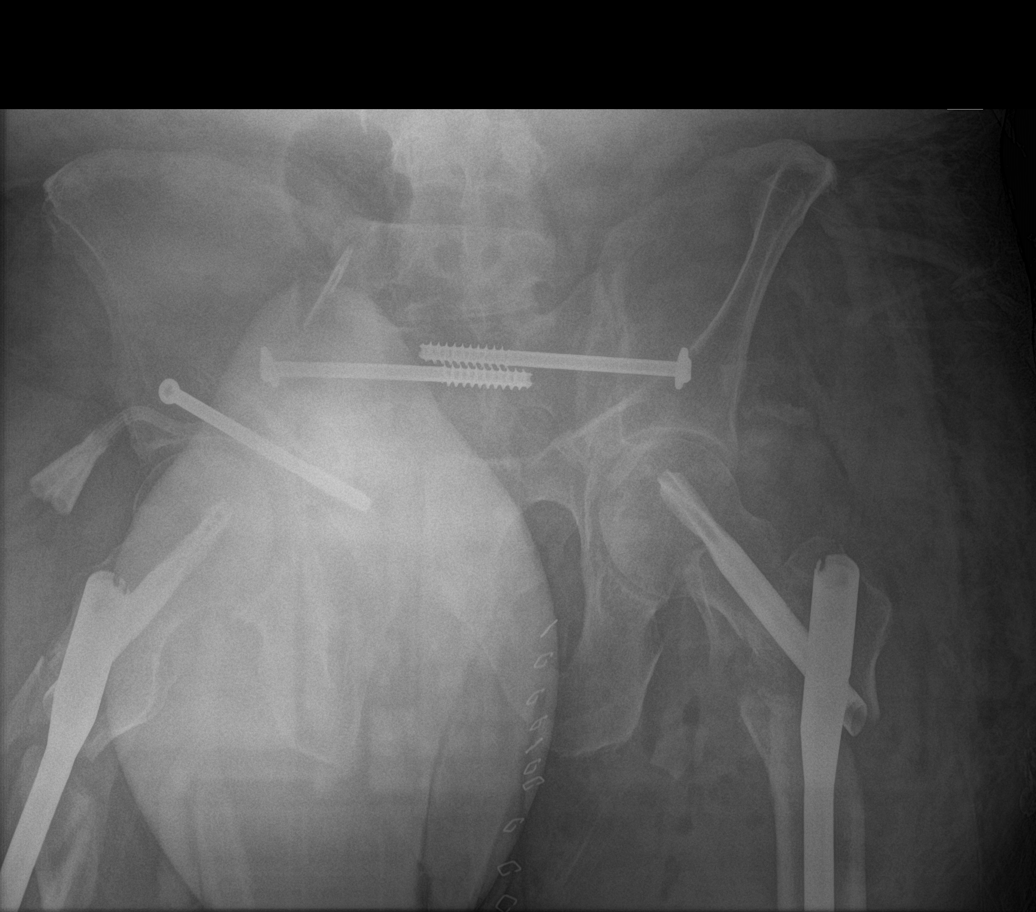

[pelvis obl (3 of 4)]
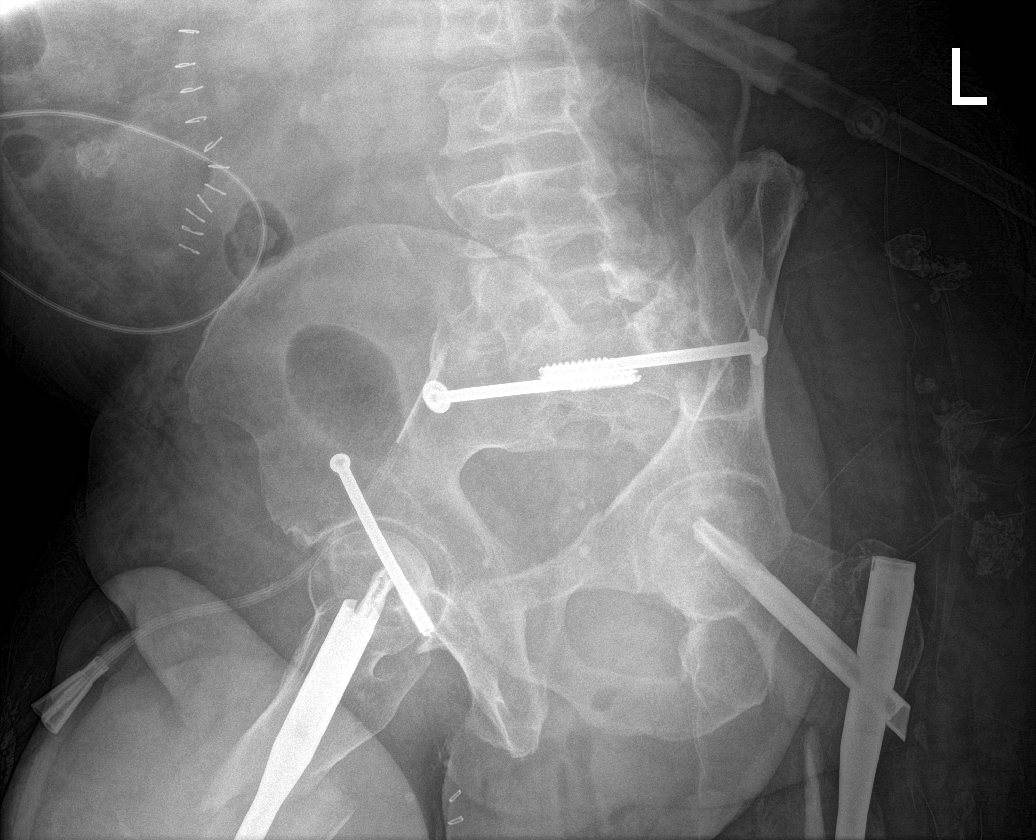

[pelvis obl (4 of 4)]
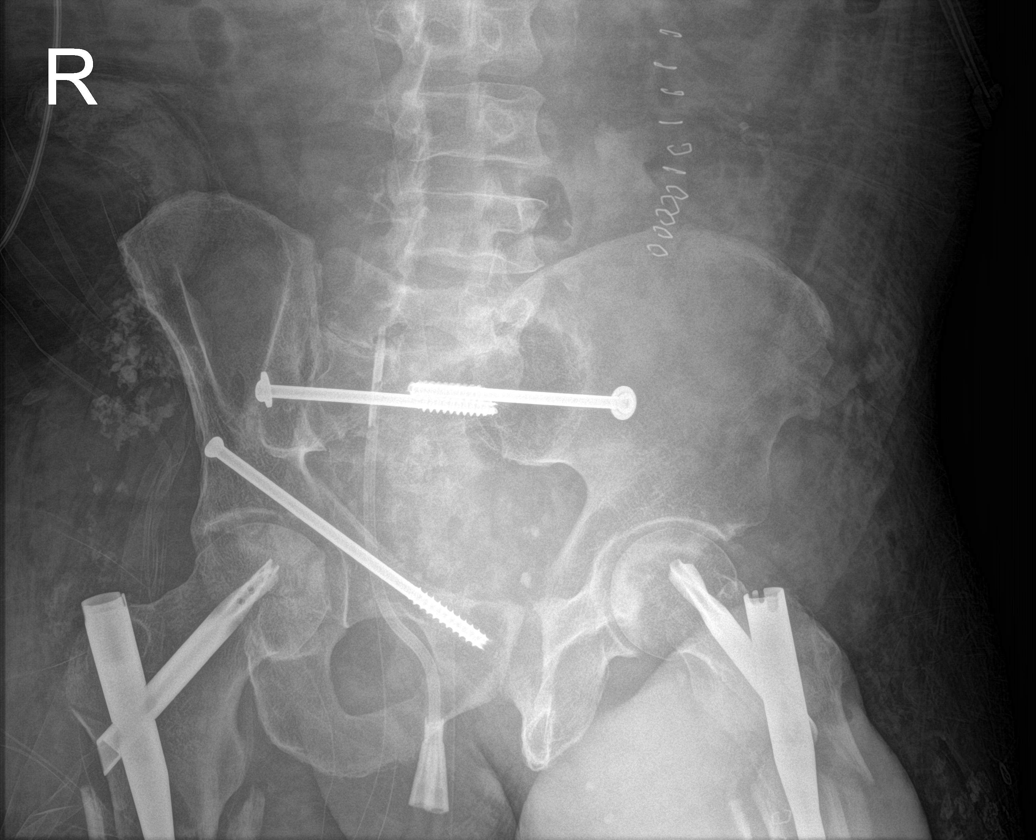

[5 of 5 positions shown; findings below may reference images not displayed]

FINDINGS: Evidence of patient's known fractures of the left inferior pubic
ramus and anterior left acetabulum with minimal displacement.
Fixation hardware bridging patient's bilateral proximal comminuted
femoral fractures intact. Single orthopedic screw traversing
patient's rib right acetabular fracture with anatomic alignment over
the fracture site. Two screws over the sacrum bridging the
sacroiliac joints. Right femoral catheter unchanged. Remainder the
exam is unchanged.
IMPRESSION: Pelvic and bilateral proximal femoral fractures post fixation with
hardware intact and near anatomic alignment over the fracture sites.
Known left acetabular and inferior pubic ramus fractures with
minimal displacement.

## 2021-09-03 IMAGING — DX DG FEMUR 2+V PORT*L*
5 series · 5 of 5 positions shown · non-contrast
Comparison: 09/14/2019

CLINICAL DATA: Postop pelvic fixation.

EXAM:
LEFT FEMUR PORTABLE 2 VIEWS

[femur ap (1 of 3)]
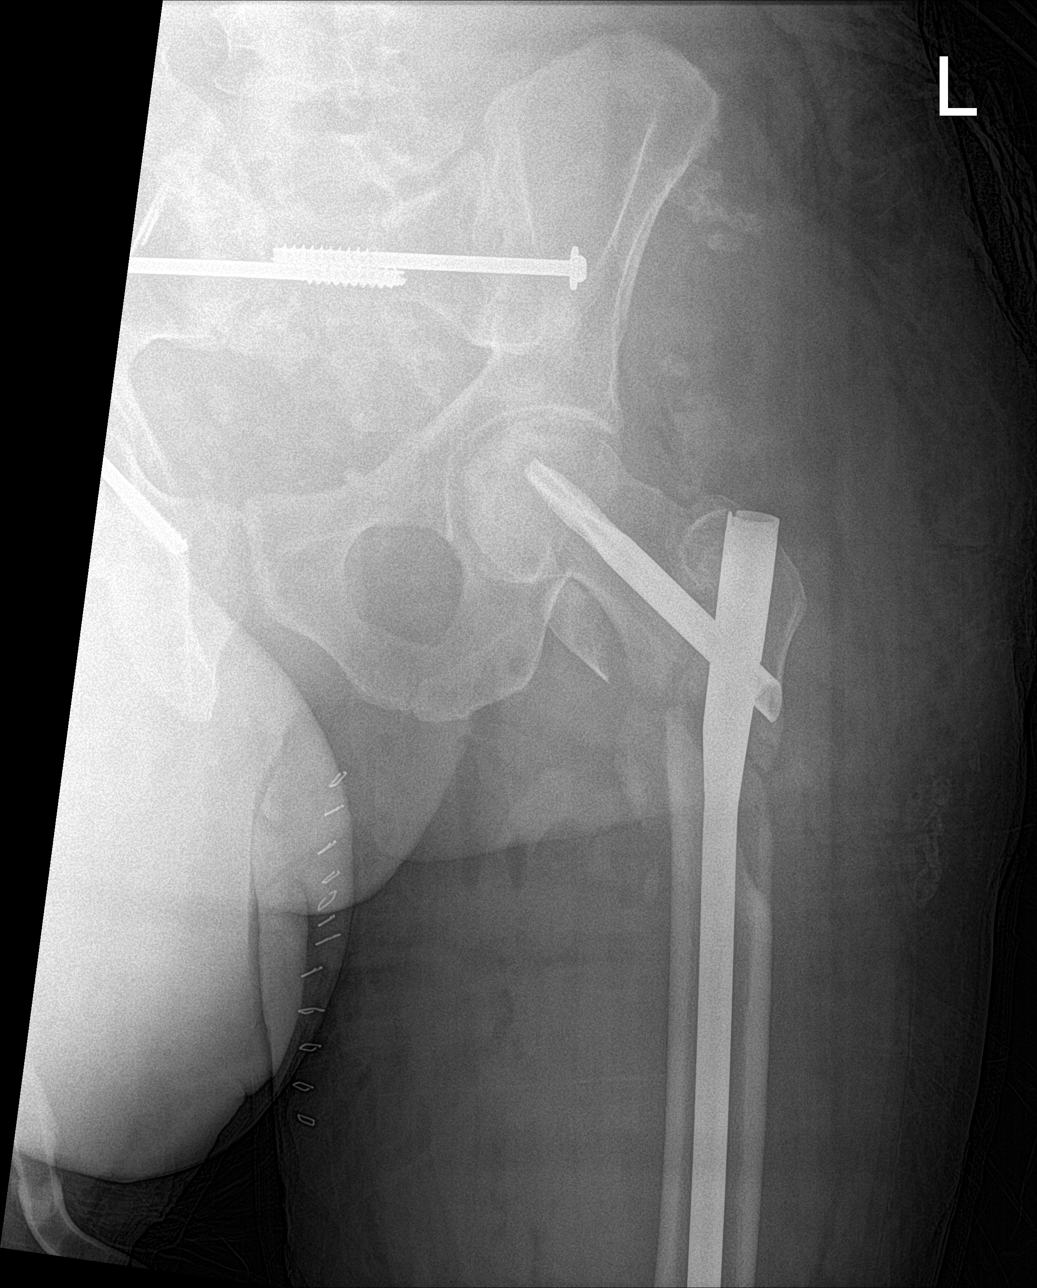

[femur ap (2 of 3)]
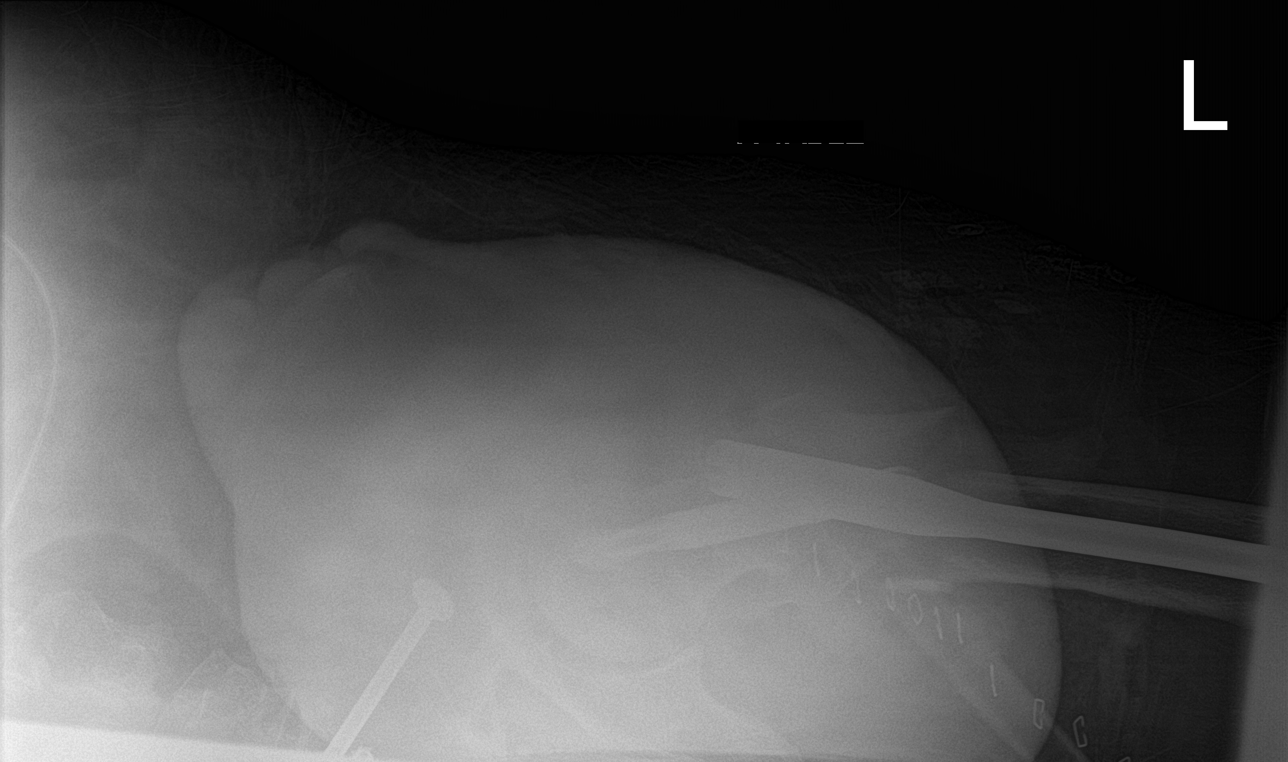

[femur lat (1 of 2)]
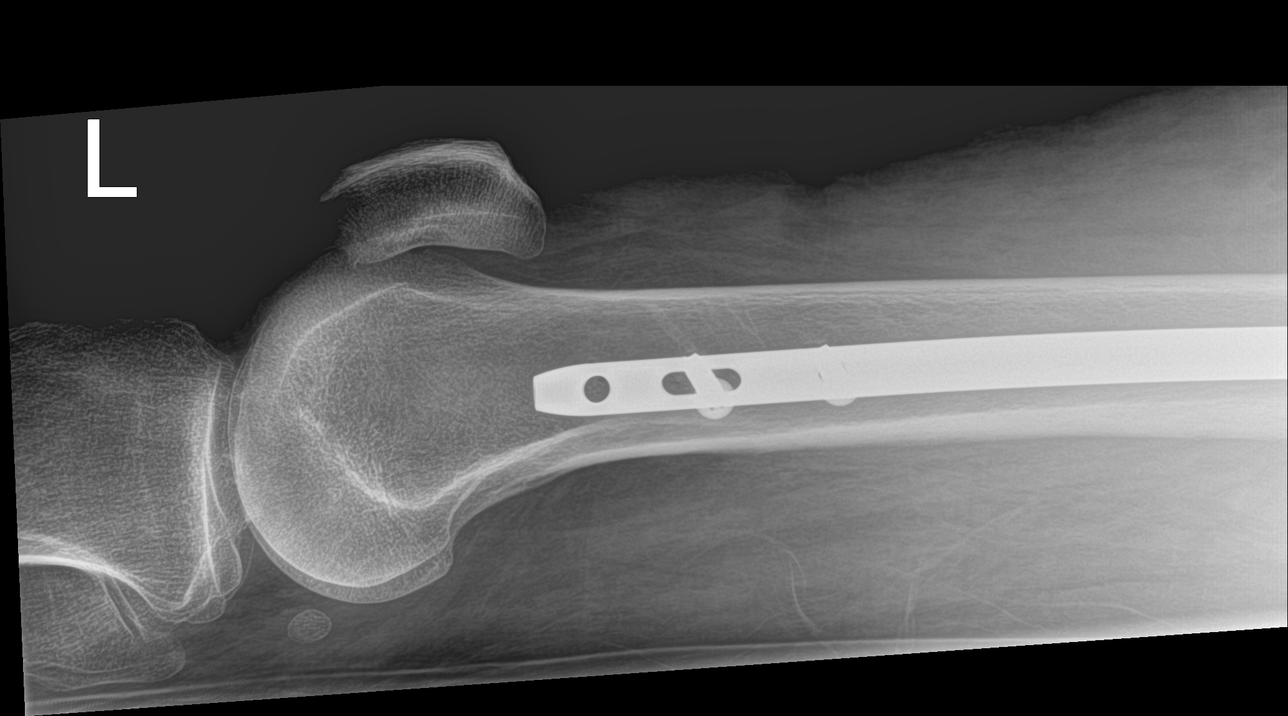

[femur lat (2 of 2)]
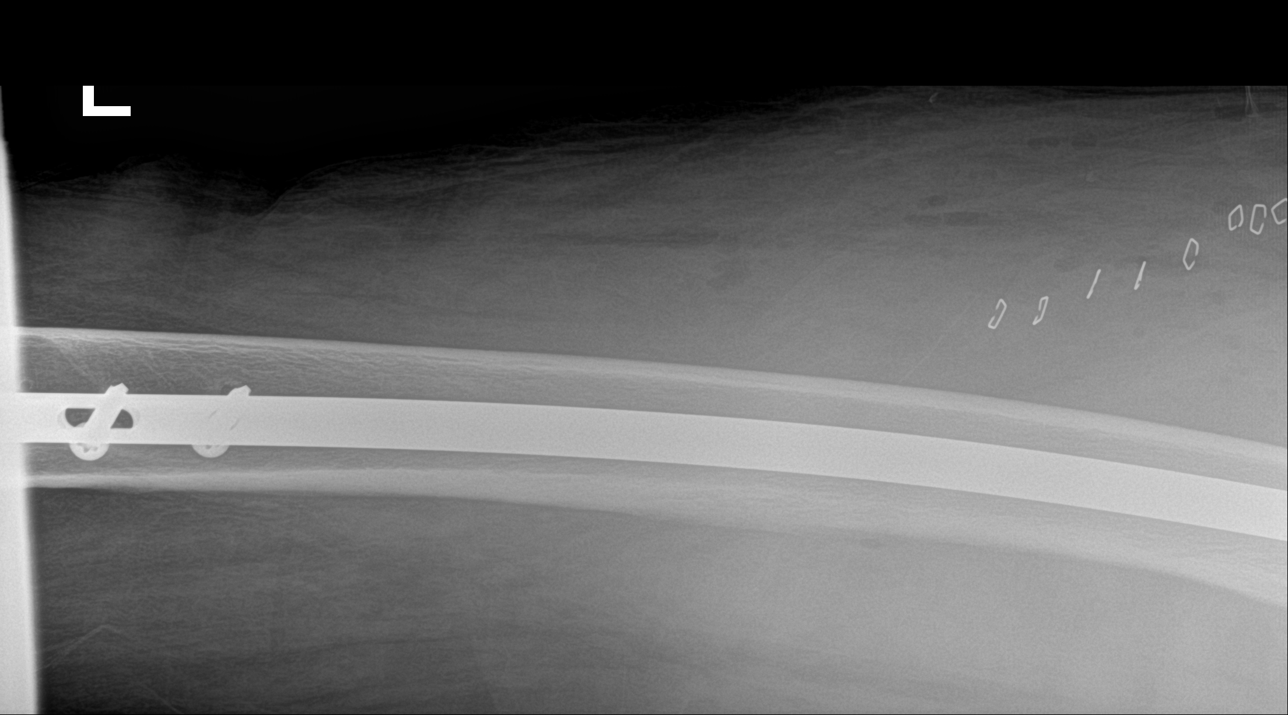

[femur ap (3 of 3)]
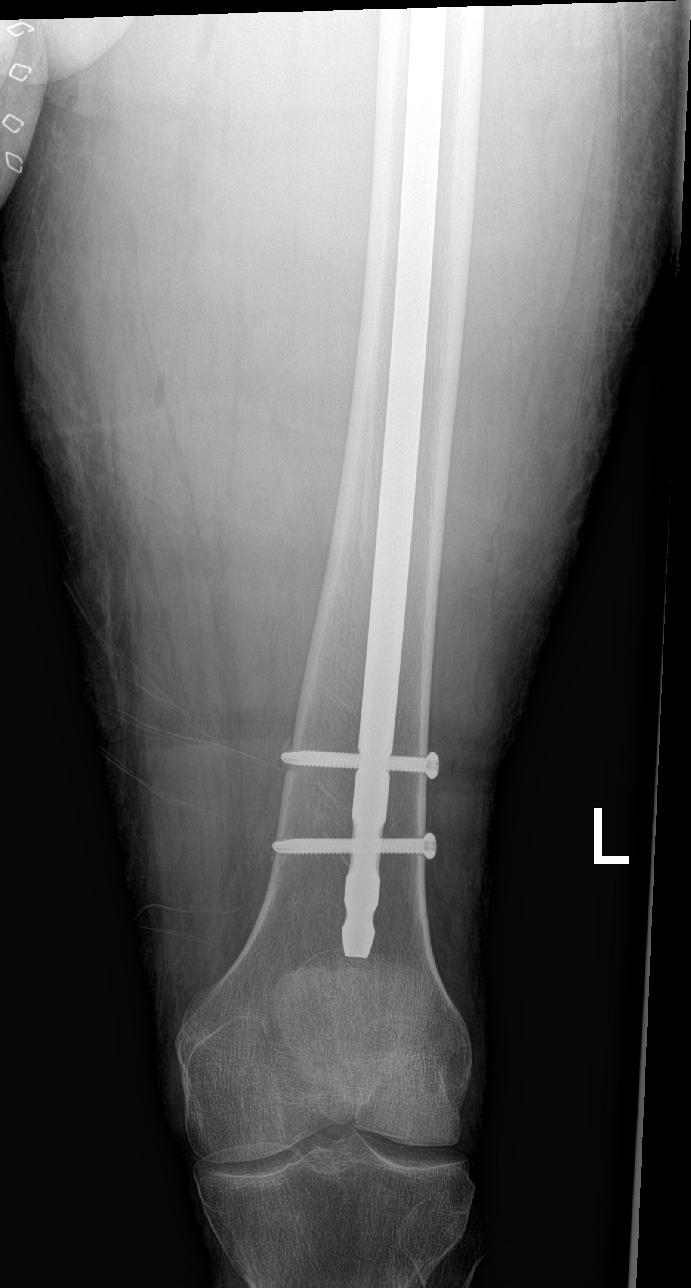

[5 of 5 positions shown; findings below may reference images not displayed]

FINDINGS: Internal fixation of patient's proximal femoral fracture with
intramedullary nail and associated compression screw bridging the
femoral neck into the femoral head as well as 2 distal femoral
screws as hardware is intact. Near anatomic alignment over the
fracture site. Mild degenerative change of the left hip. Two
orthopedic screws noted over the sacrum bridging the sacroiliac
joints. Evidence of patient's known left inferior pubic ramus
fracture. Partially visualized orthopedic screw over the right
pelvis.
IMPRESSION: Fixation of patient's proximal left femoral fracture with hardware
intact and near anatomic alignment over the fracture site.

Cessation hardware partially visualized over the sacrum and right
pelvis. Known left inferior pubic ramus fracture.

## 2021-09-03 IMAGING — DX DG FEMUR 2+V PORT*R*
4 series · 4 of 4 positions shown · non-contrast
Comparison: 09/14/2019

CLINICAL DATA: Postop pelvic fixation.

EXAM:
RIGHT FEMUR PORTABLE 2 VIEW

[femur ap (1 of 2)]
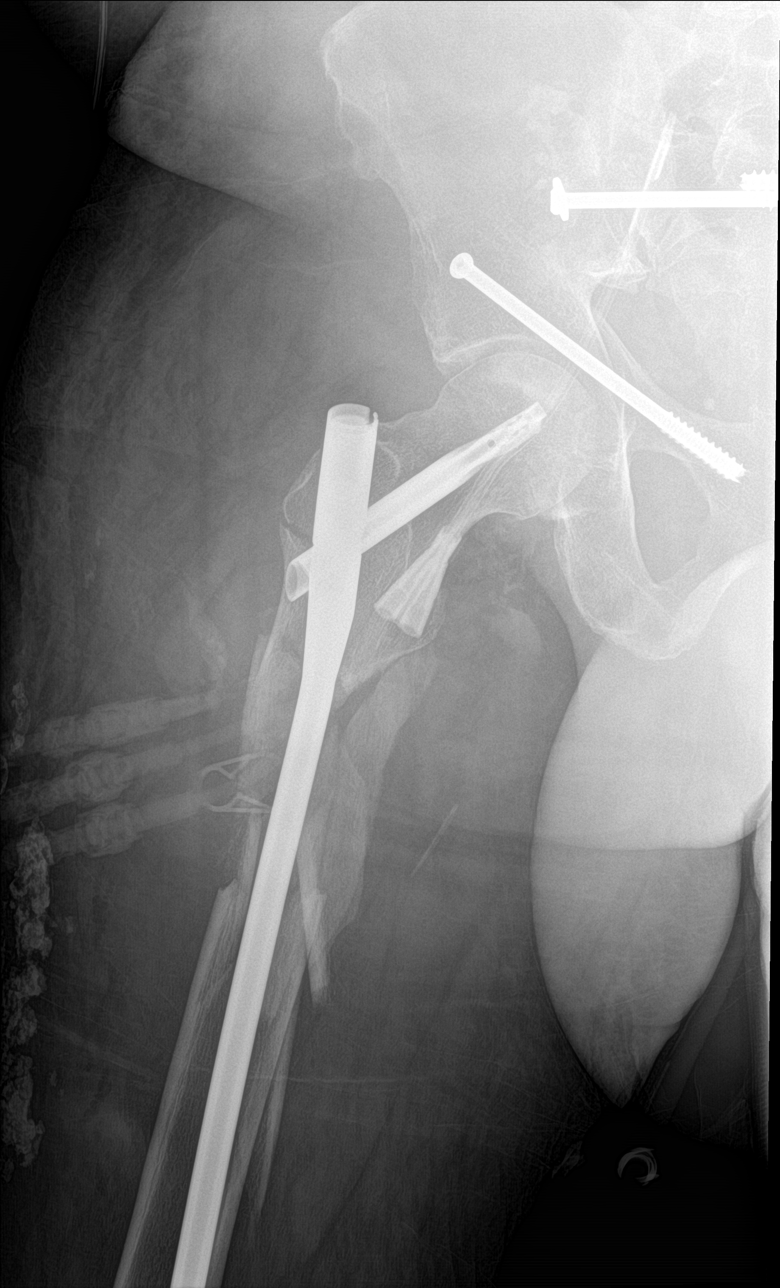

[femur ap (2 of 2)]
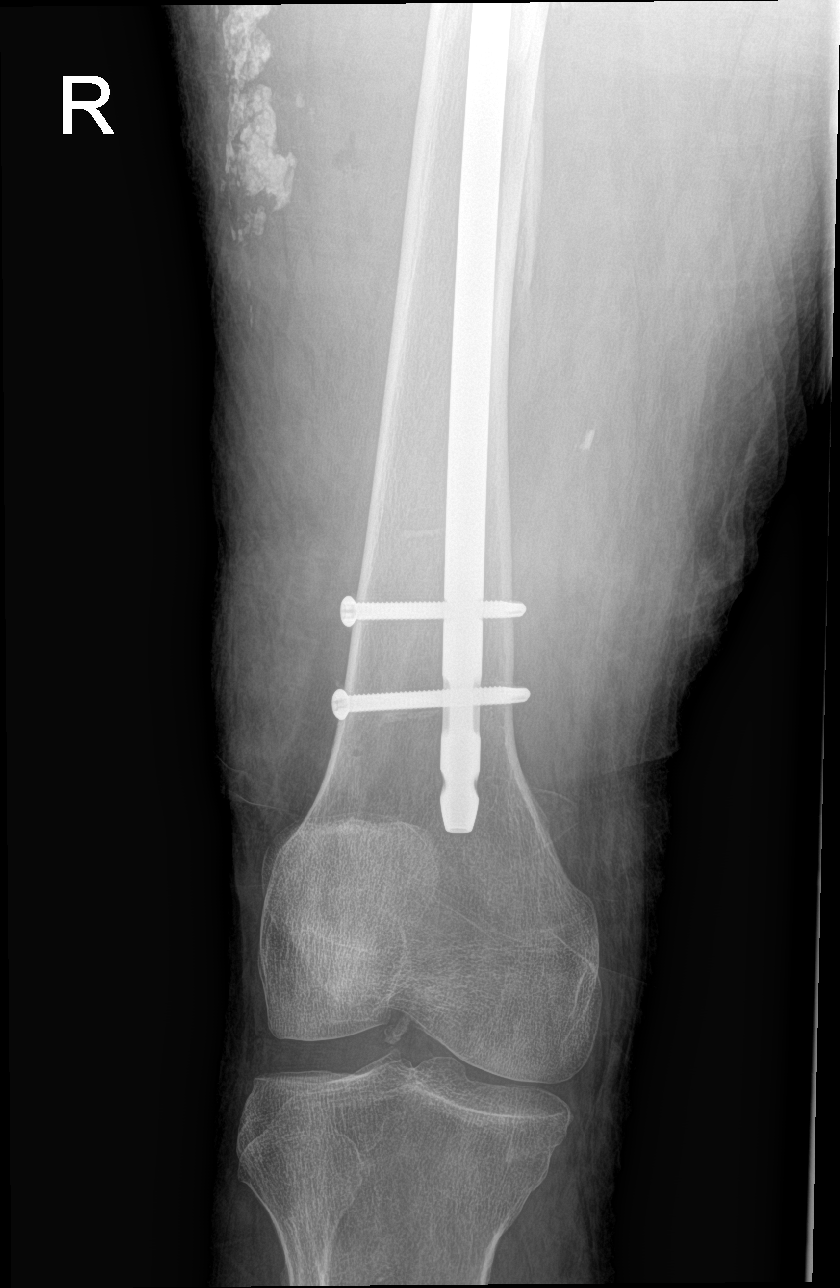

[femur lat (1 of 2)]
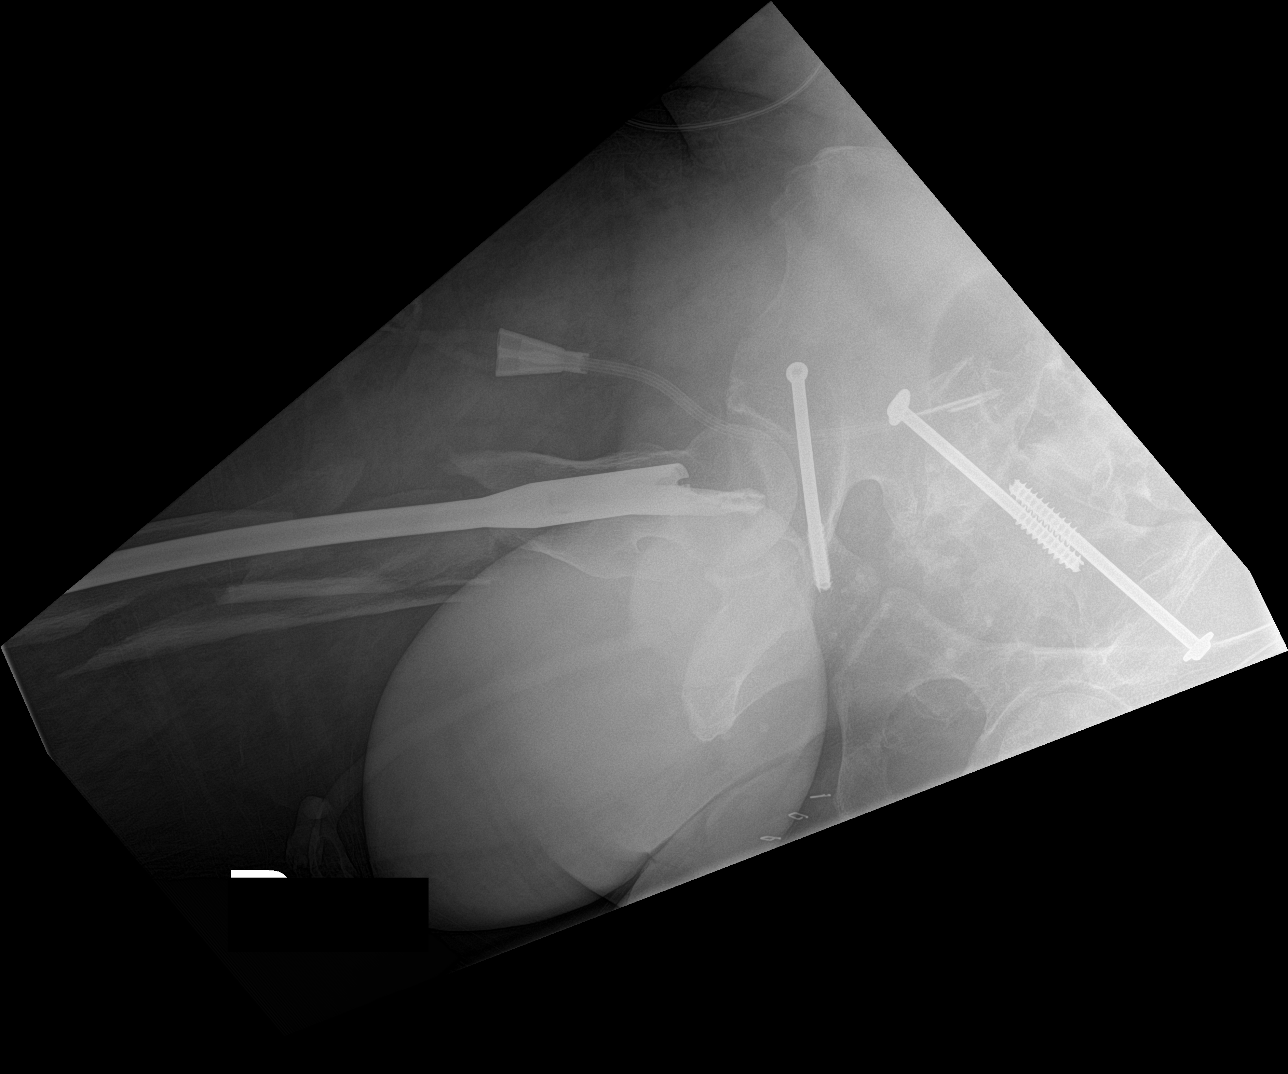

[femur lat (2 of 2)]
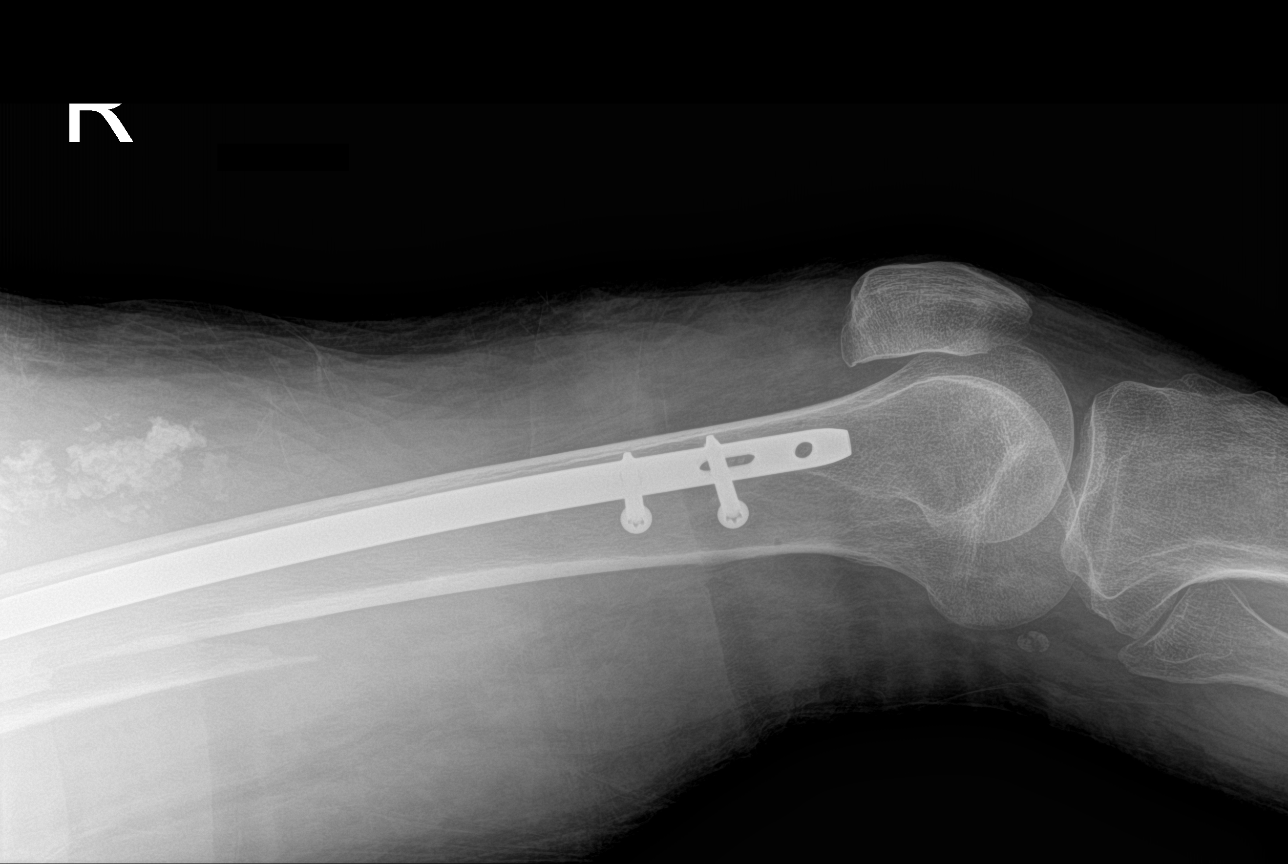

[4 of 4 positions shown; findings below may reference images not displayed]

FINDINGS: Evidence of patient's comminuted right proximal femoral fracture
post fixation with intramedullary nail and associated screw bridging
the femoral neck into the femoral head as well as 2 distal femoral
screws all intact. Near anatomic alignment over the fracture site.
Orthopedic screw over the right acetabulum. Partially visualized
screw over the right sacrum bridging the sacroiliac joint.
IMPRESSION: Fixation of comminuted right proximal femoral fracture with hardware
intact and near anatomic alignment over the fracture site.

## 2021-09-03 IMAGING — DX DG CHEST 1V PORT
1 series · 1 of 1 positions shown · non-contrast
Comparison: Chest radiograph 09/16/2019

CLINICAL DATA: Respiratory failure.

EXAM:
PORTABLE CHEST 1 VIEW

[chest ap]
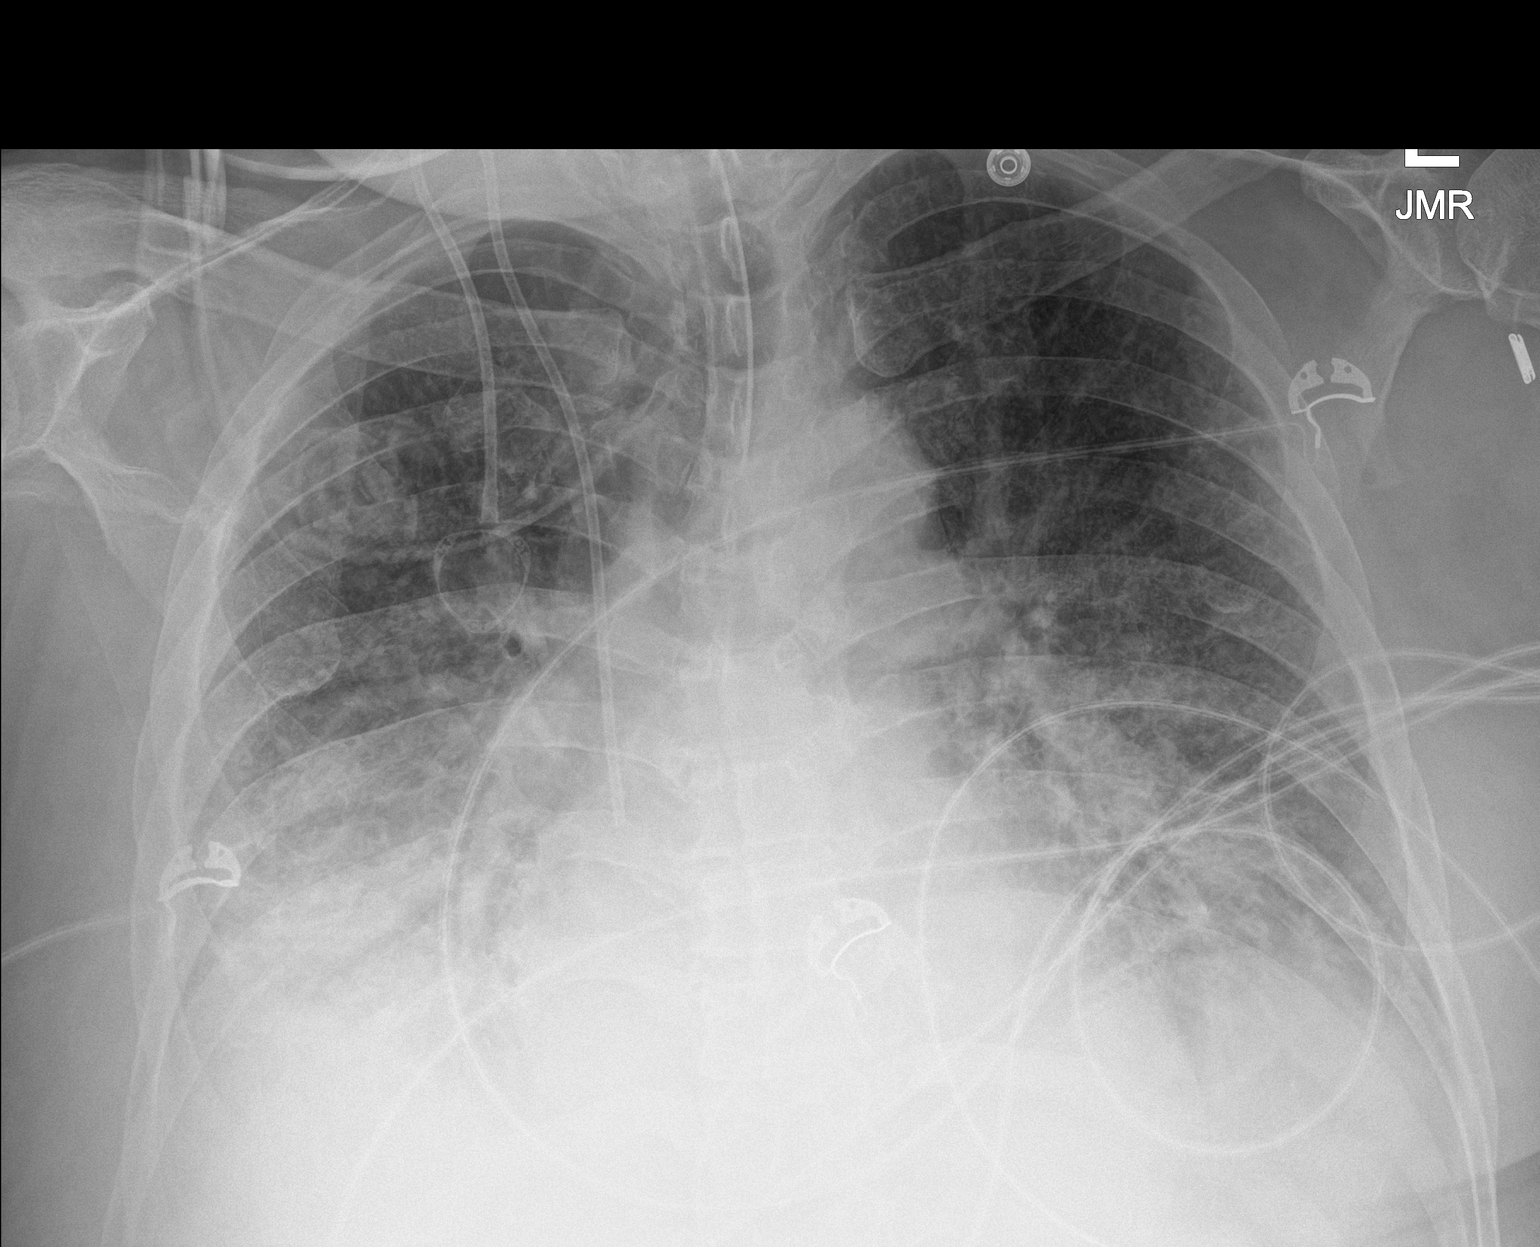

[1 of 1 positions shown; findings below may reference images not displayed]

FINDINGS: ET tube mid trachea. Right anterior chest wall Port-A-Cath is
present with tip projecting over the superior vena cava. Monitoring
leads overlie the patient. Stable cardiomegaly. Interval increase in
diffuse bilateral heterogeneous pulmonary opacities. Small bilateral
pleural effusions. No pneumothorax.
IMPRESSION: Cardiomegaly with worsening interstitial opacities favored to
represent edema.

Small bilateral effusions.

## 2021-09-04 IMAGING — DX DG CHEST 1V PORT
1 series · 1 of 1 positions shown · non-contrast
Comparison: Chest radiograph 09/17/2019

CLINICAL DATA: Respiratory failure

EXAM:
PORTABLE CHEST 1 VIEW

[chest ap]
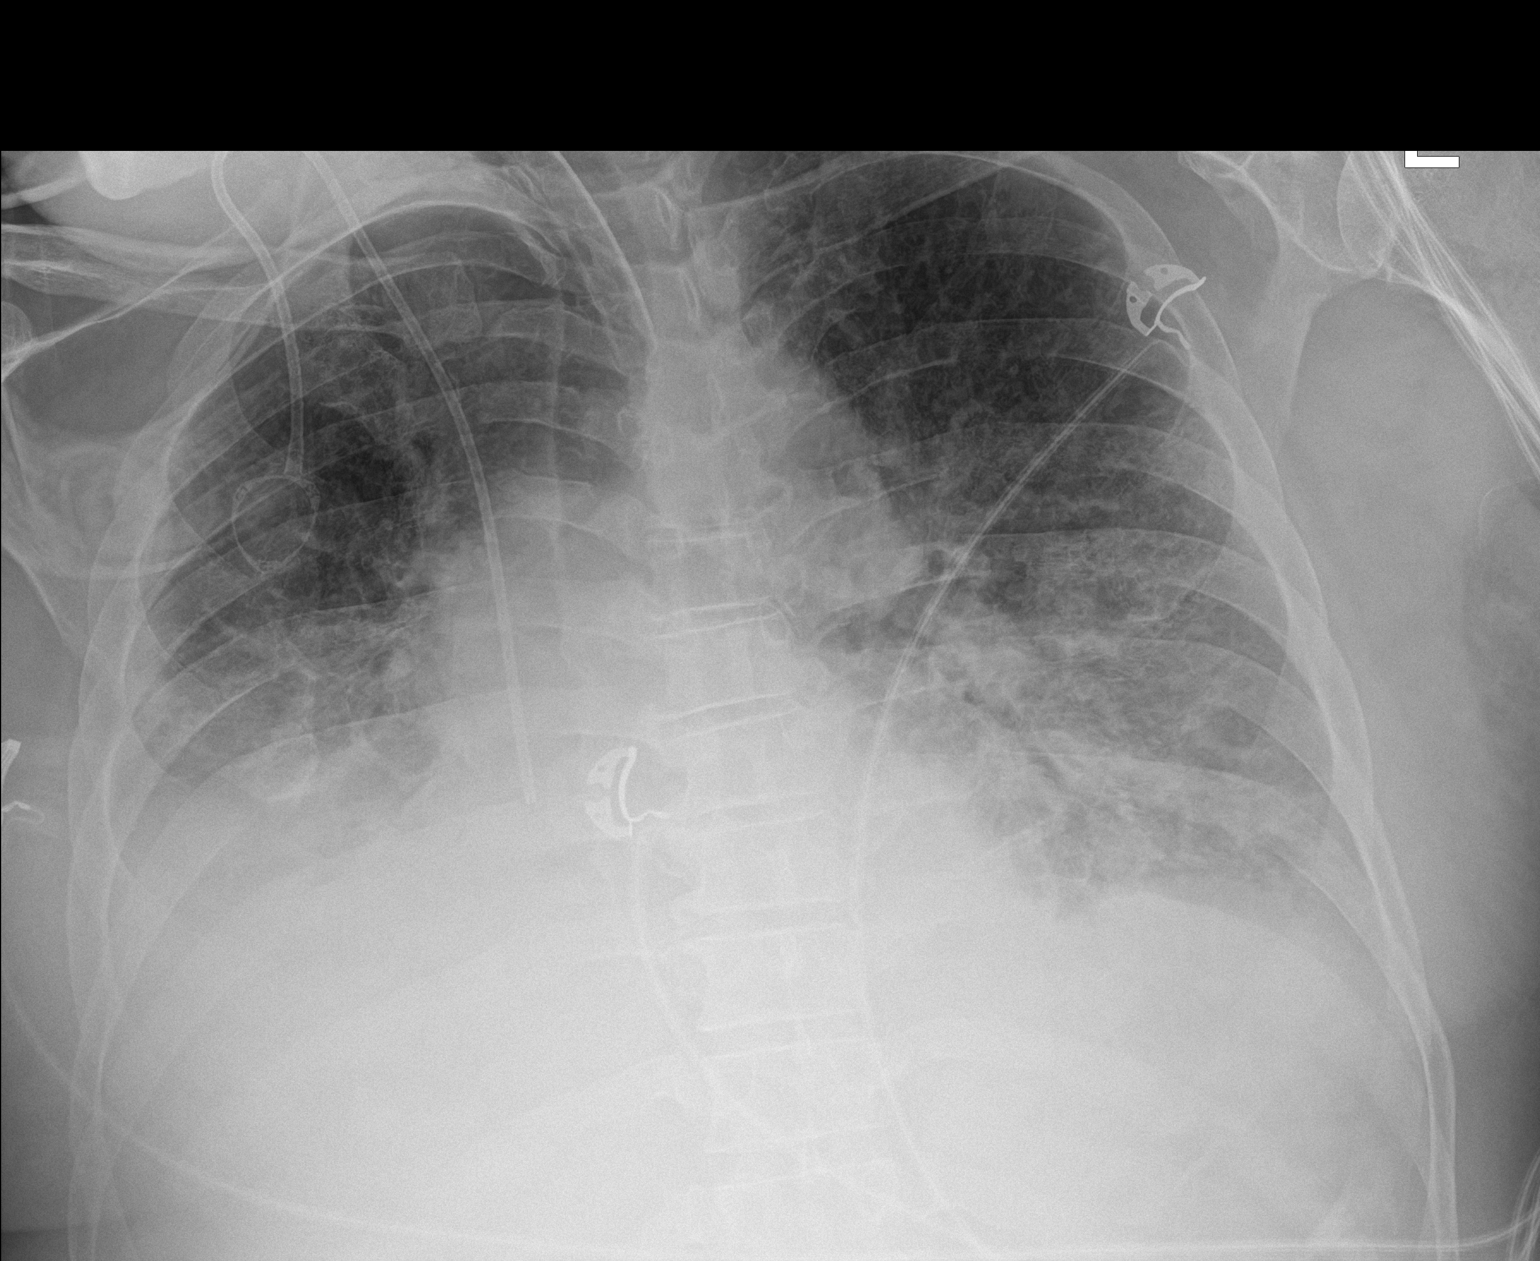

[1 of 1 positions shown; findings below may reference images not displayed]

FINDINGS: Right anterior chest wall Port-A-Cath is present tip projecting over
the superior vena cava. ETT terminates mid trachea. Monitoring leads
overlie the patient. Stable cardiomegaly. Increasing moderate
layering right pleural effusion with underlying consolidation. Small
left pleural effusion. Similar bilateral interstitial pulmonary
opacities.
IMPRESSION: Similar to mild interval increase in moderate right pleural effusion
with underlying consolidation.

Similar bilateral interstitial opacities favored to represent edema.

## 2021-09-05 IMAGING — RF DG ANKLE COMPLETE 3+V*L*
1 series · 7 of 7 positions shown · non-contrast
Comparison: 09/14/2019

CLINICAL DATA: ORIF

EXAM:
LEFT ANKLE COMPLETE - 3+ VIEW

[Series 1: run · 7 of 7 slices shown]
[im 1/7]
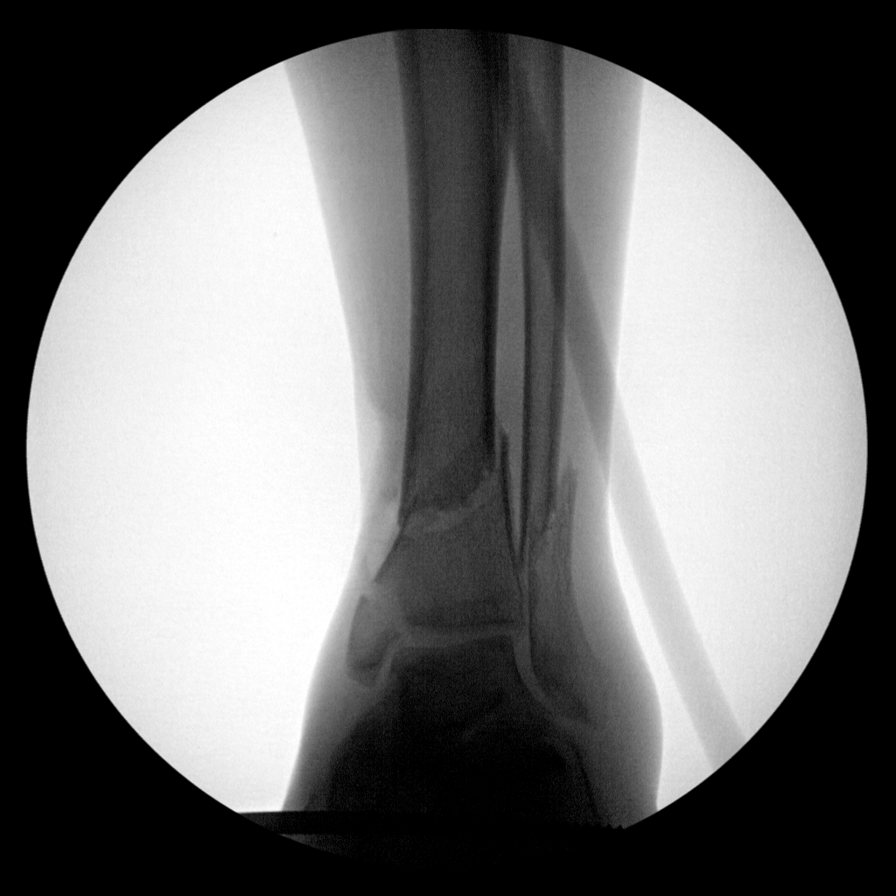
[im 2/7]
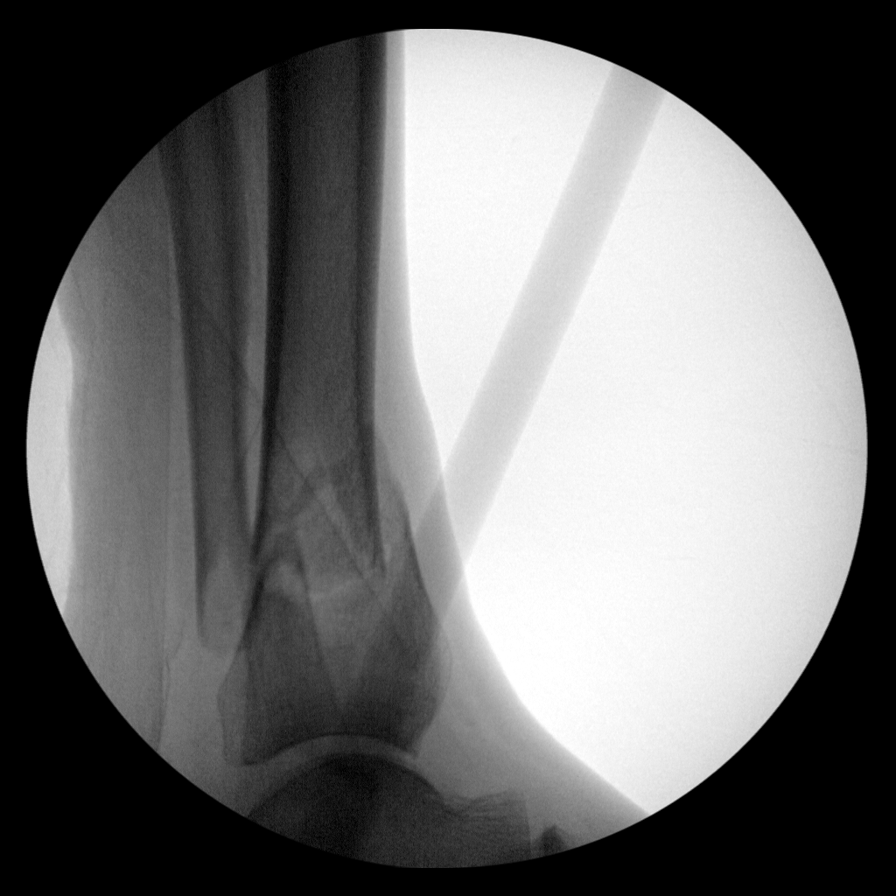
[im 3/7]
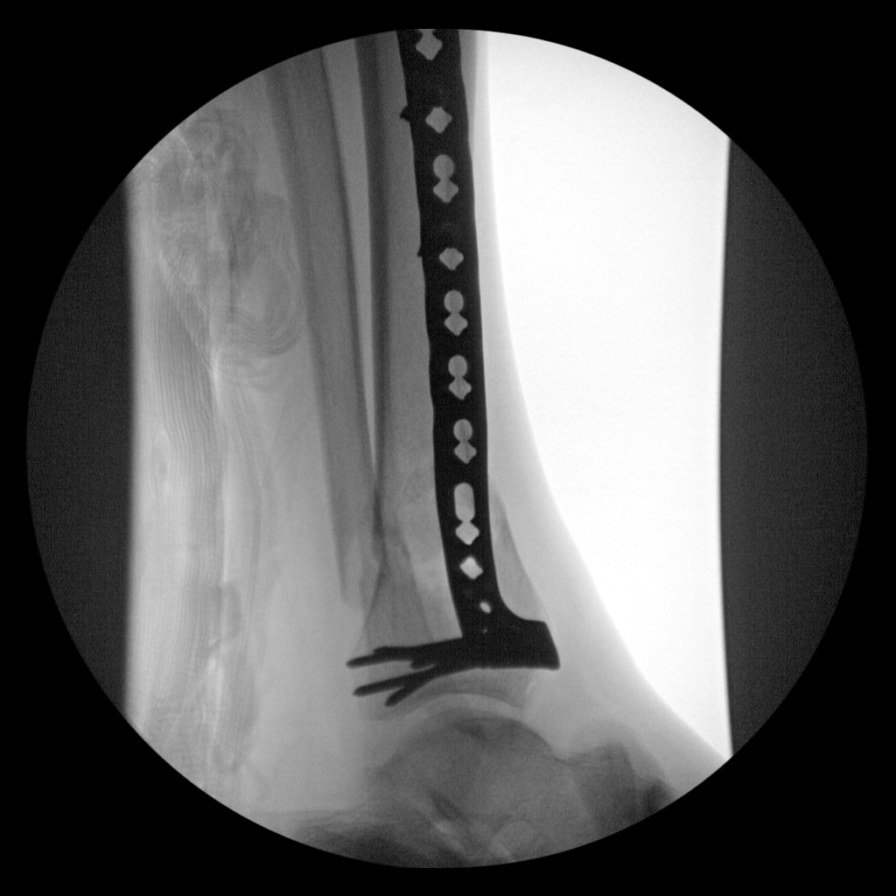
[im 4/7]
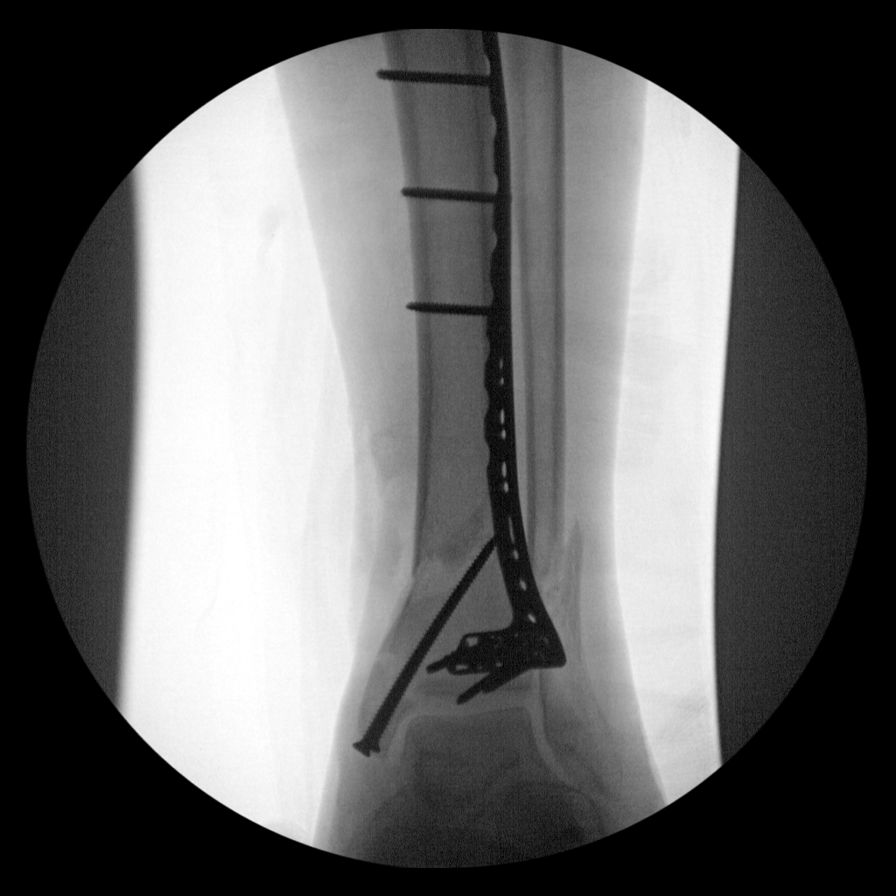
[im 5/7]
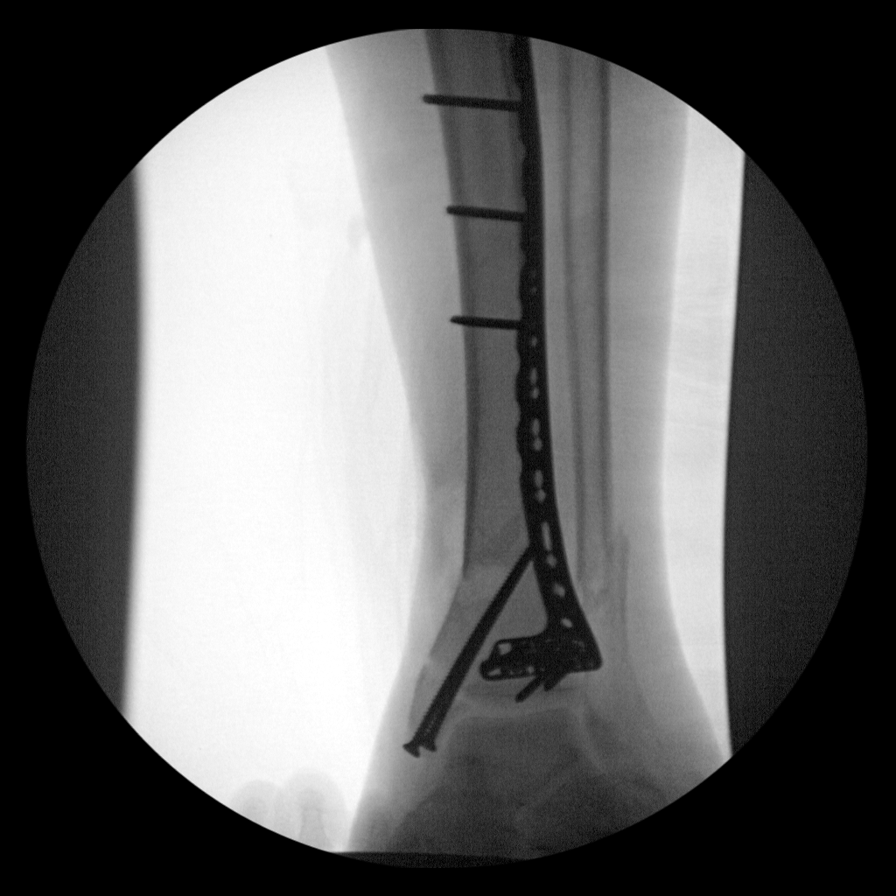
[im 6/7]
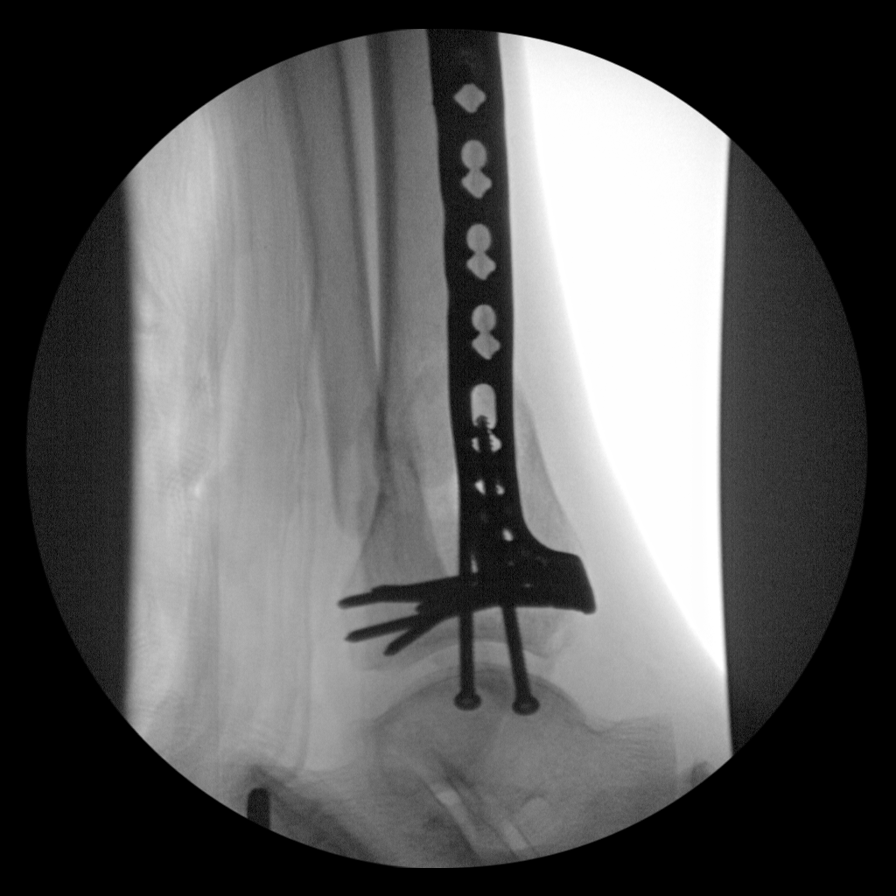
[im 7/7]
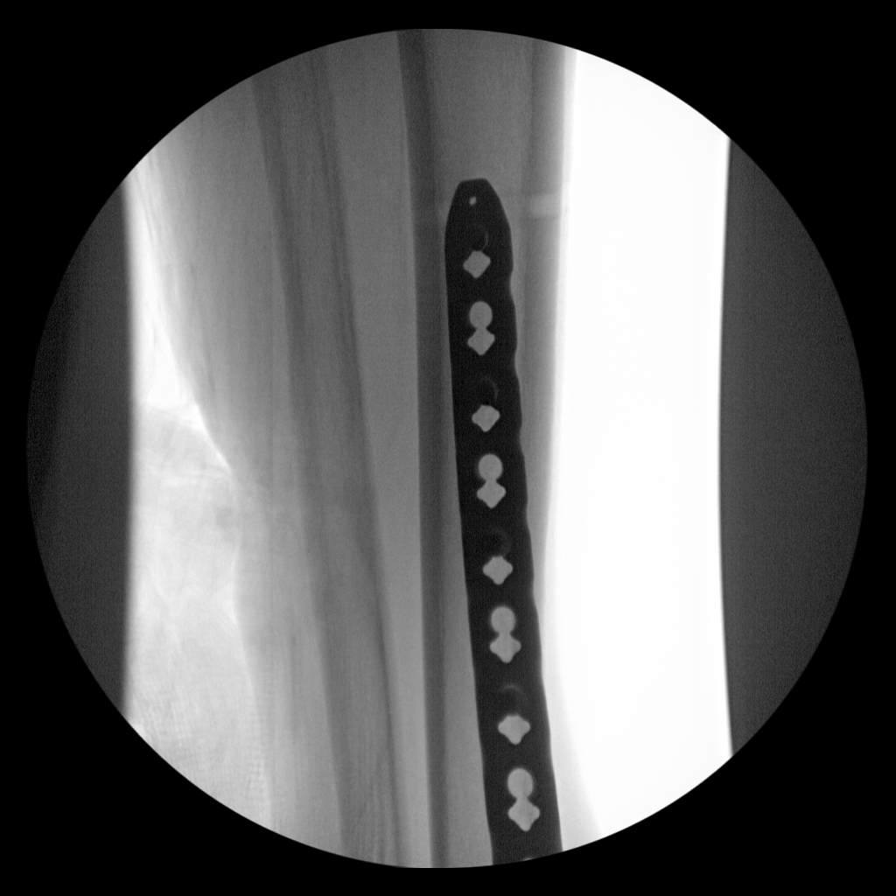

[7 of 7 positions shown; findings below may reference images not displayed]

FINDINGS: Seven low resolution intraoperative spot views of the left ankle.
Total fluoroscopy time was 4 minutes 47 seconds. Initial images
demonstrate comminuted distal tibial and fibular fractures.
Subsequent images demonstrate surgical plate and screw fixation of
the tibia. Fixating screws across a medial malleolar fracture. [DATE]
shaft diameter residual lateral displacement of distal fibular
fracture fragment.
IMPRESSION: Intraoperative fluoroscopic assistance provided during surgical
fixation of tibial fractures.

## 2021-09-05 IMAGING — DX DG CHEST 1V PORT
1 series · 1 of 1 positions shown · non-contrast
Comparison: 09/18/2019.

CLINICAL DATA: Respiratory failure.

EXAM:
PORTABLE CHEST 1 VIEW

[chest ap]
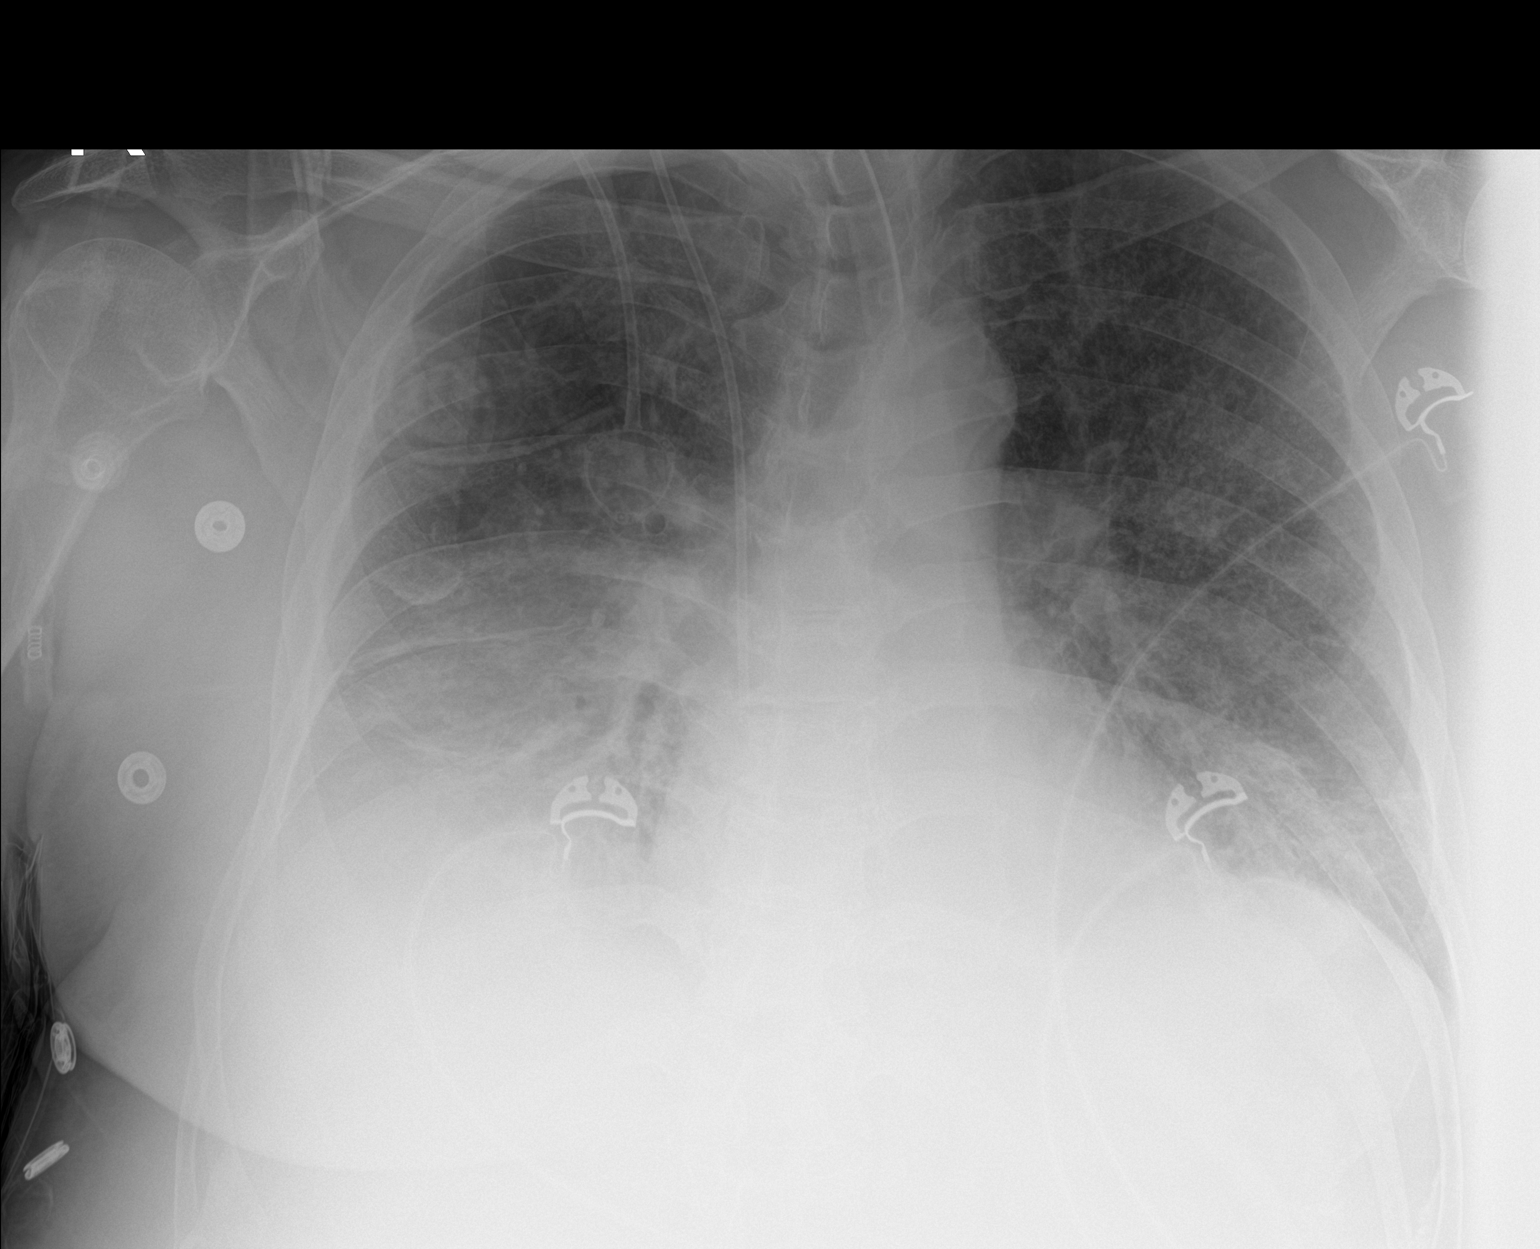

[1 of 1 positions shown; findings below may reference images not displayed]

FINDINGS: Endotracheal tube, PowerPort catheter in stable position. Heart size
stable. Diffuse bilateral interstitial prominence again noted. Right
base infiltrate again noted. Right pleural effusion again noted.
Similar findings noted on prior exam. No pneumothorax. Right tenth
rib fracture best identified by prior CT.
IMPRESSION: 1.  Lines and tubes in stable position.

2. Diffuse bilateral interstitial prominence, right base infiltrate,
and right pleural effusion again noted. No significant interim
change. No pneumothorax

## 2021-09-05 IMAGING — RF DG ANKLE COMPLETE 3+V*R*
1 series · 6 of 6 positions shown · non-contrast
Comparison: 09/14/2019, 09/13/2019

CLINICAL DATA: ORIF

EXAM:
RIGHT ANKLE - COMPLETE 3+ VIEW; DG C-ARM 1-60 MIN

[Series 1: run · 6 of 6 slices shown]
[im 1/6]
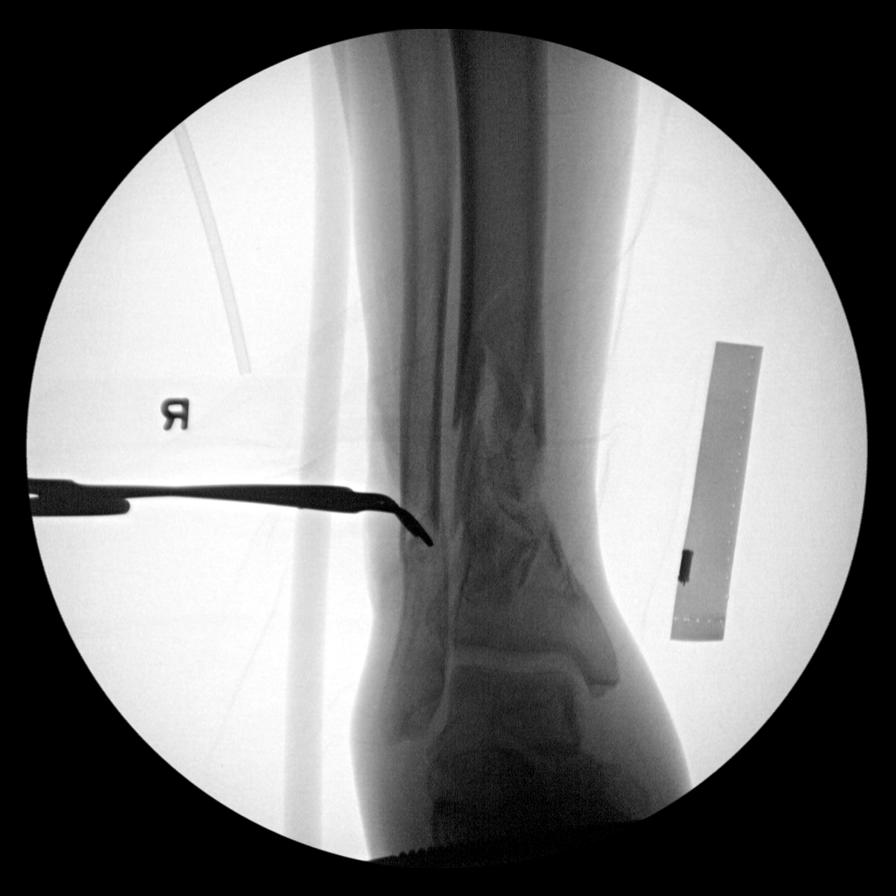
[im 2/6]
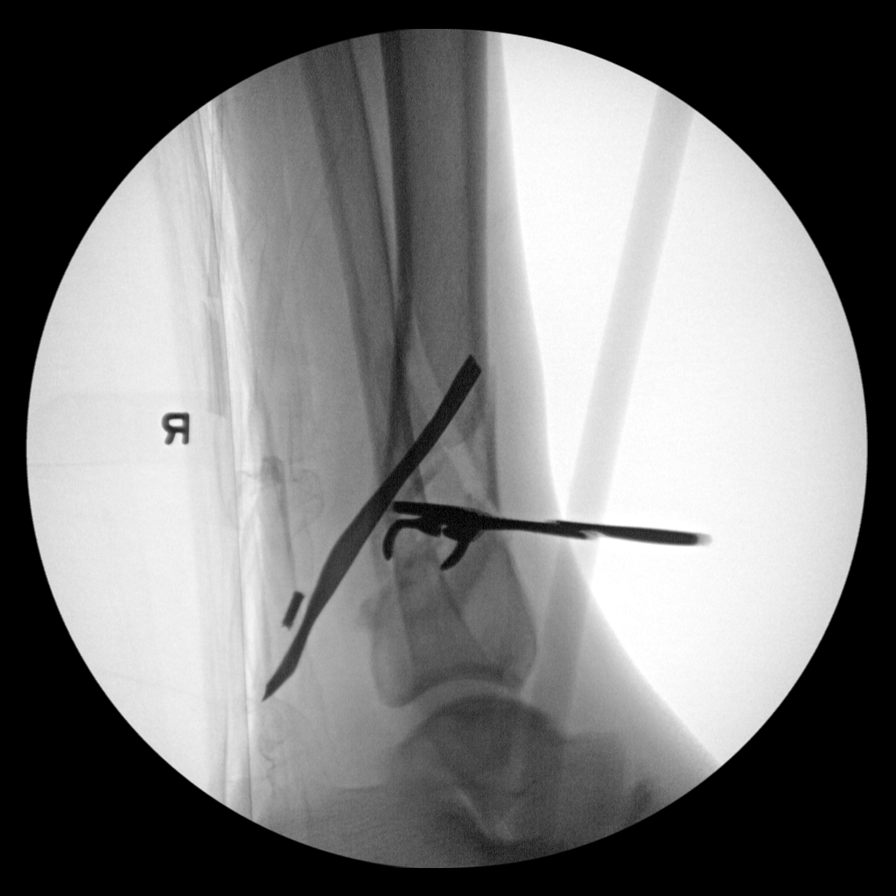
[im 3/6]
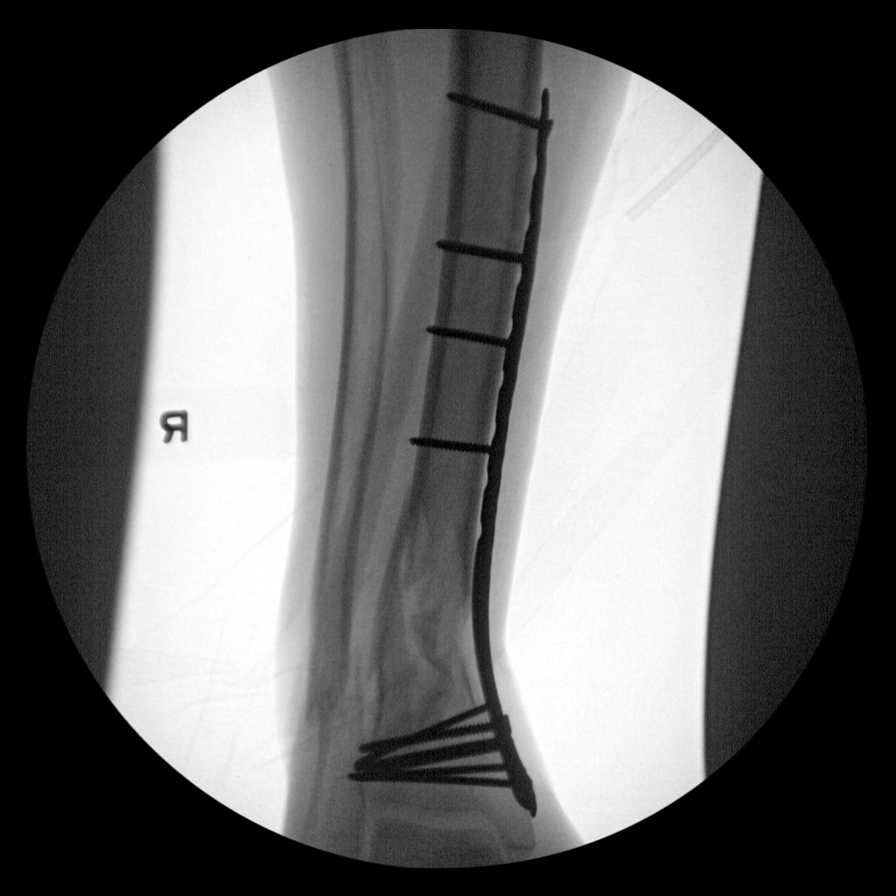
[im 4/6]
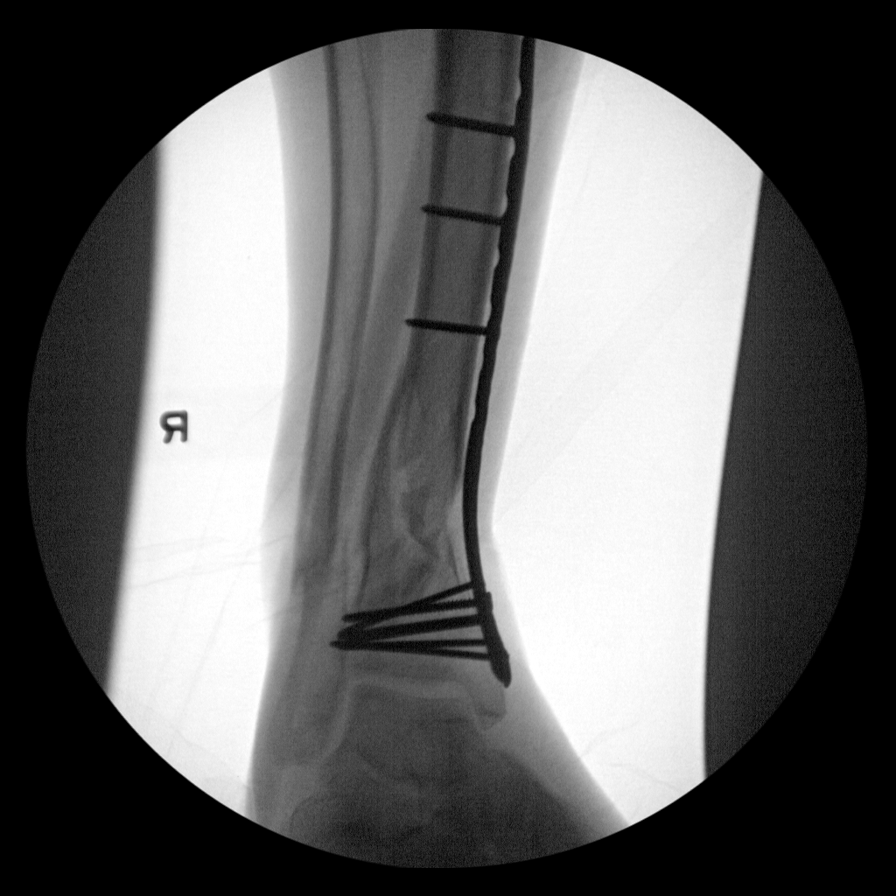
[im 5/6]
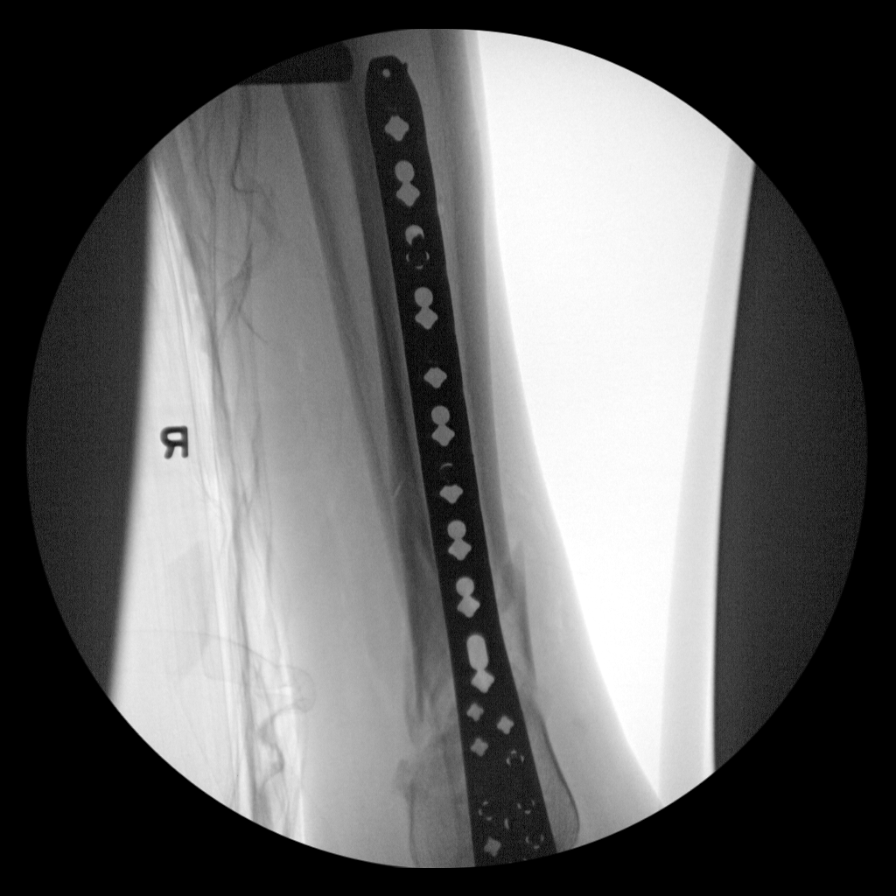
[im 6/6]
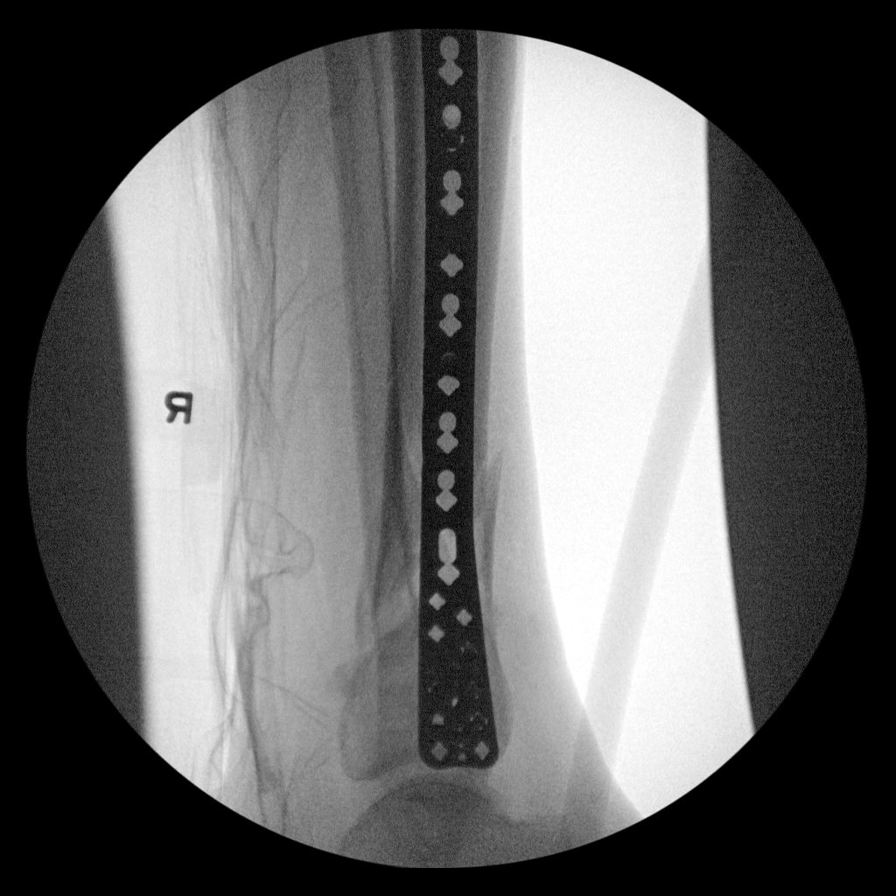

[6 of 6 positions shown; findings below may reference images not displayed]

FINDINGS: Six low resolution intraoperative spot views of the right ankle.
Total fluoroscopy time was 4 minutes 47 seconds. The images
demonstrate highly comminuted distal tibial fracture and comminuted
distal fibular fracture. Subsequent images demonstrate placement of
surgical plate and multiple fixating screws across the mid to distal
tibia. Residual [DATE] shaft diameter of medial displacement of the
distal fibular fracture fragment.
IMPRESSION: Intraoperative fluoroscopic assistance provided during surgical
fixation of comminuted distal tibial fracture

## 2021-09-05 IMAGING — RF DG C-ARM 1-60 MIN
1 series · 6 of 6 positions shown · non-contrast
Comparison: 09/14/2019, 09/13/2019

CLINICAL DATA: ORIF

EXAM:
RIGHT ANKLE - COMPLETE 3+ VIEW; DG C-ARM 1-60 MIN

[Series 1: run · 6 of 6 slices shown]
[im 1/6]
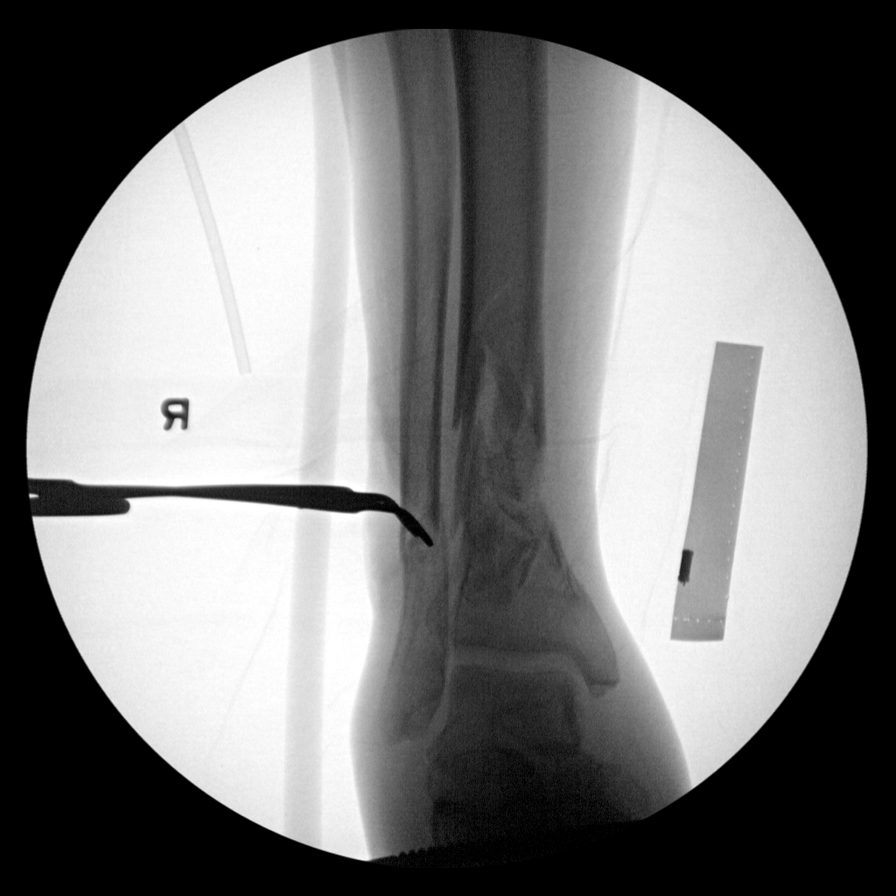
[im 2/6]
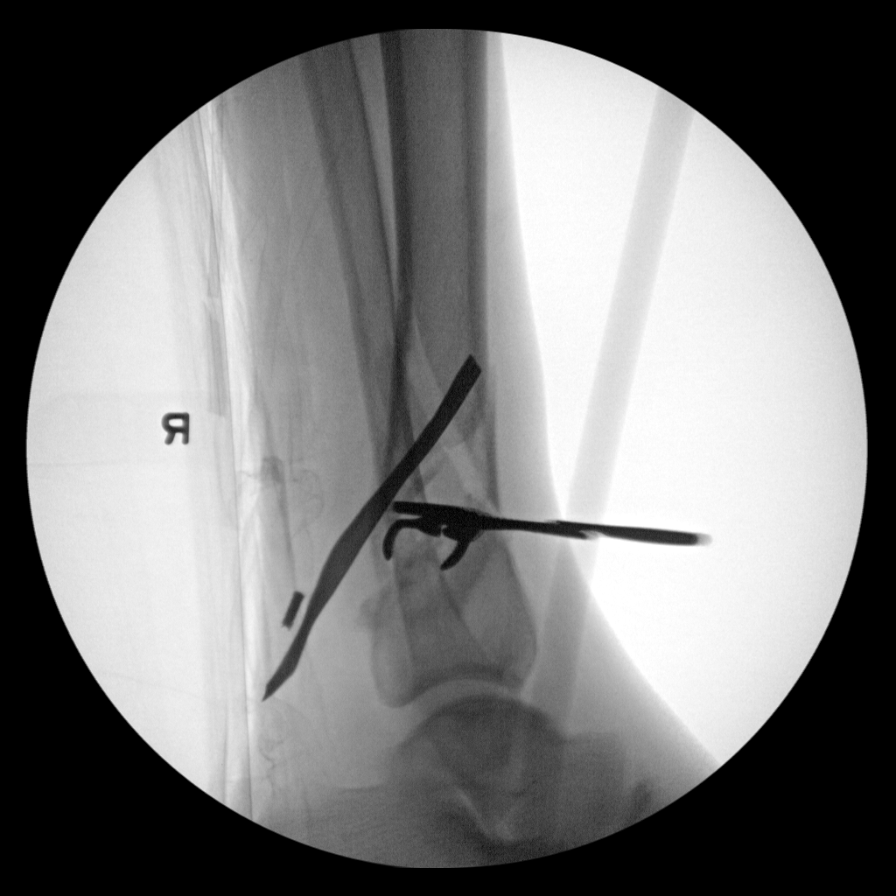
[im 3/6]
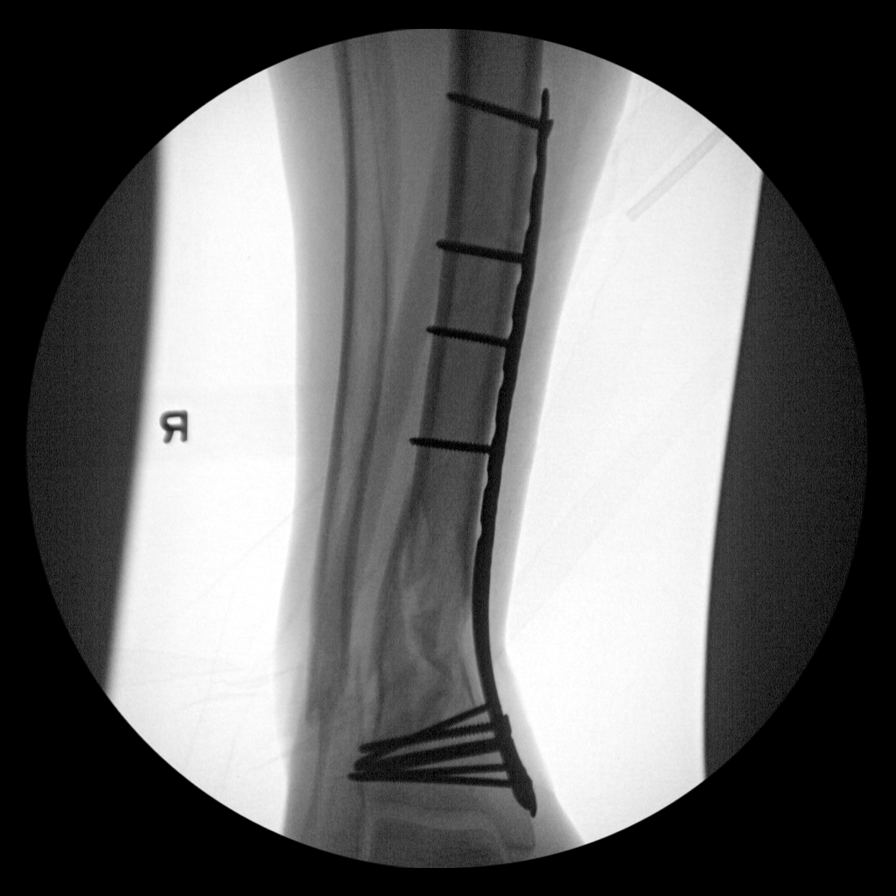
[im 4/6]
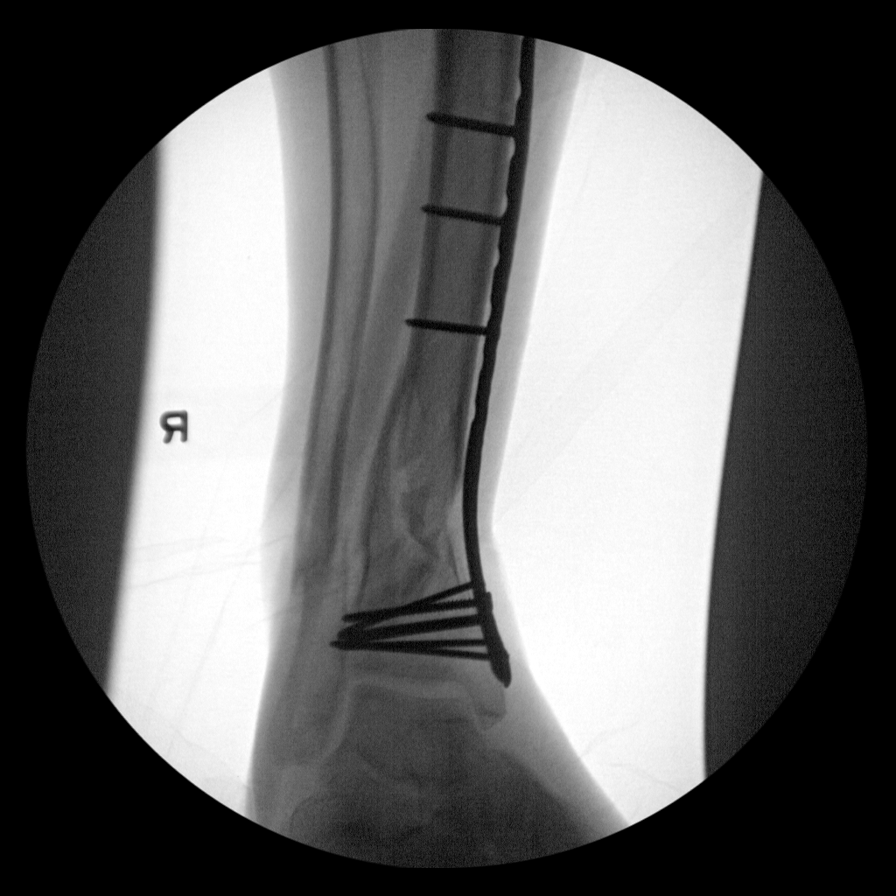
[im 5/6]
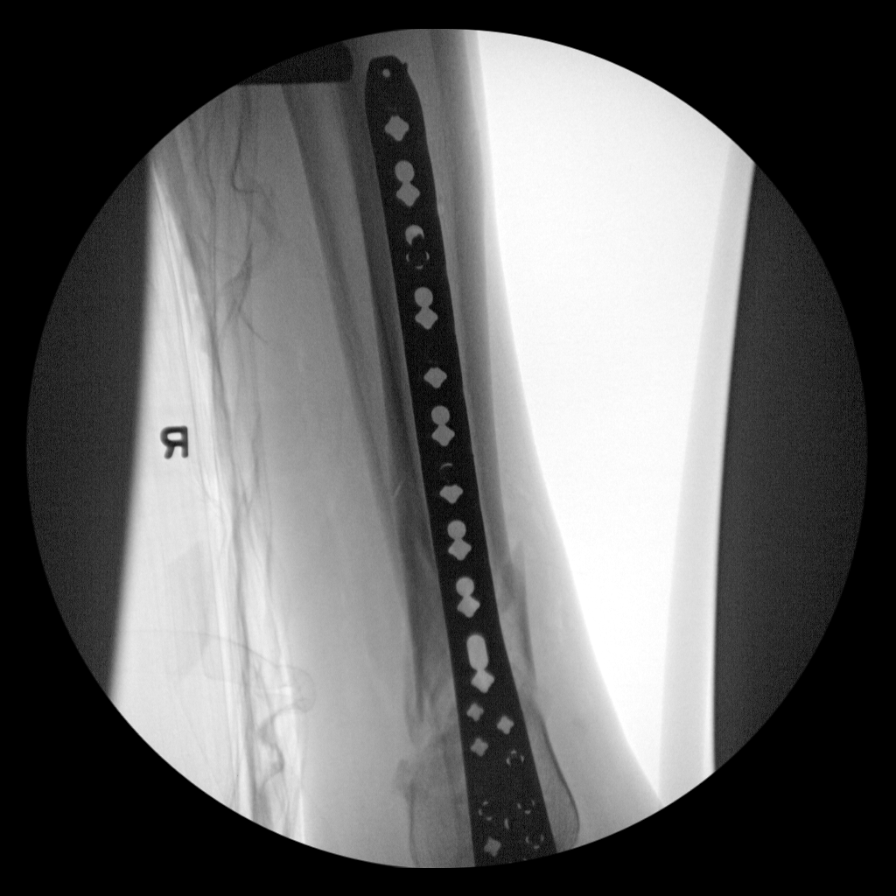
[im 6/6]
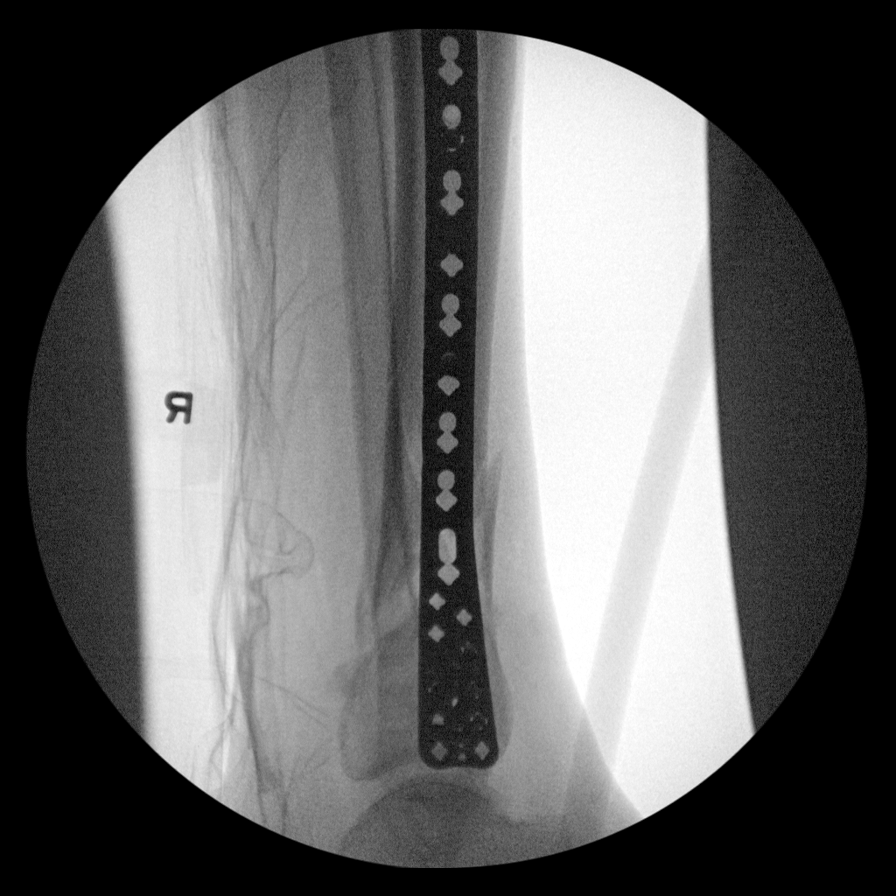

[6 of 6 positions shown; findings below may reference images not displayed]

FINDINGS: Six low resolution intraoperative spot views of the right ankle.
Total fluoroscopy time was 4 minutes 47 seconds. The images
demonstrate highly comminuted distal tibial fracture and comminuted
distal fibular fracture. Subsequent images demonstrate placement of
surgical plate and multiple fixating screws across the mid to distal
tibia. Residual [DATE] shaft diameter of medial displacement of the
distal fibular fracture fragment.
IMPRESSION: Intraoperative fluoroscopic assistance provided during surgical
fixation of comminuted distal tibial fracture

## 2021-09-05 IMAGING — DX DG ANKLE COMPLETE 3+V*L*
3 series · 3 of 3 positions shown · non-contrast
Comparison: Preoperative assessment of 09/13/2019

CLINICAL DATA: Post bilateral ankle surgery.

EXAM:
LEFT ANKLE COMPLETE - 3+ VIEW

[ankle ap]
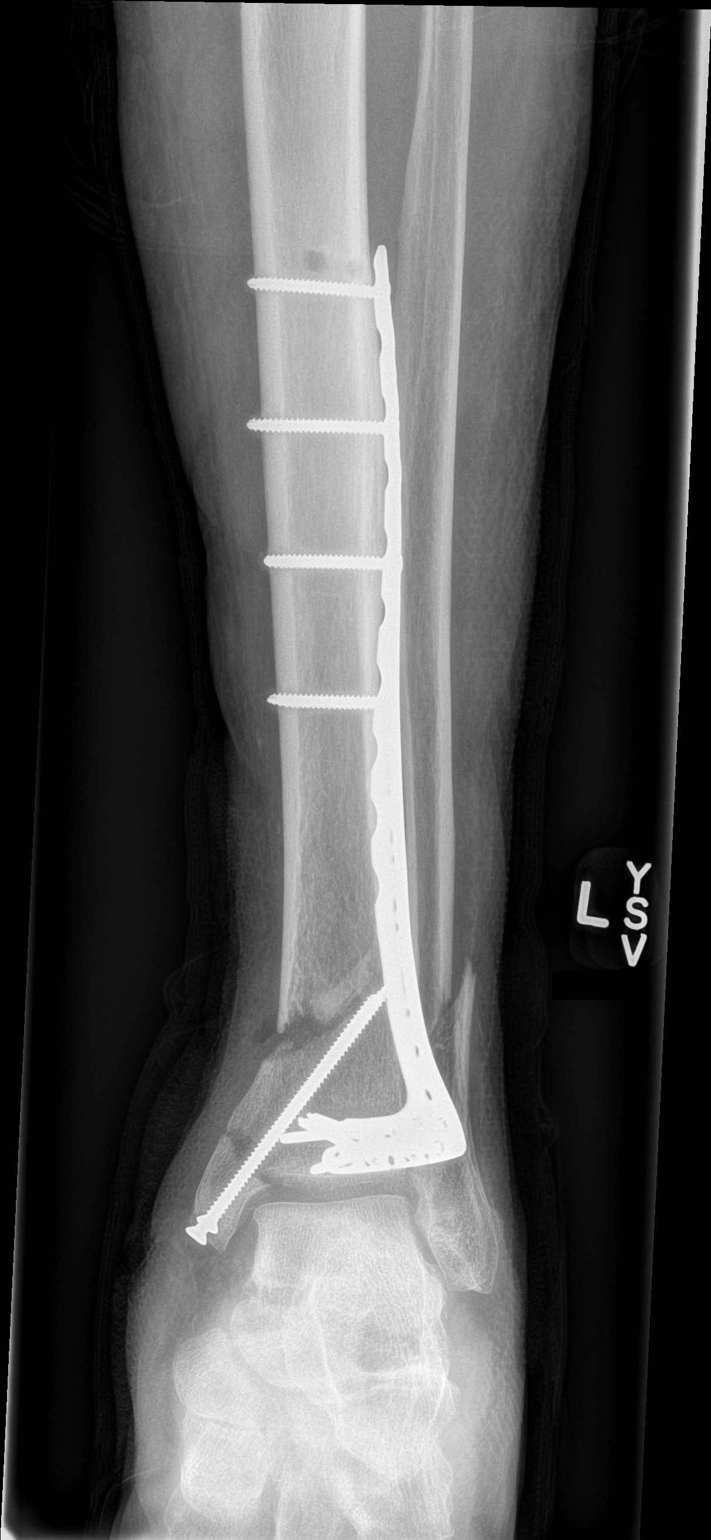

[ankle lat]
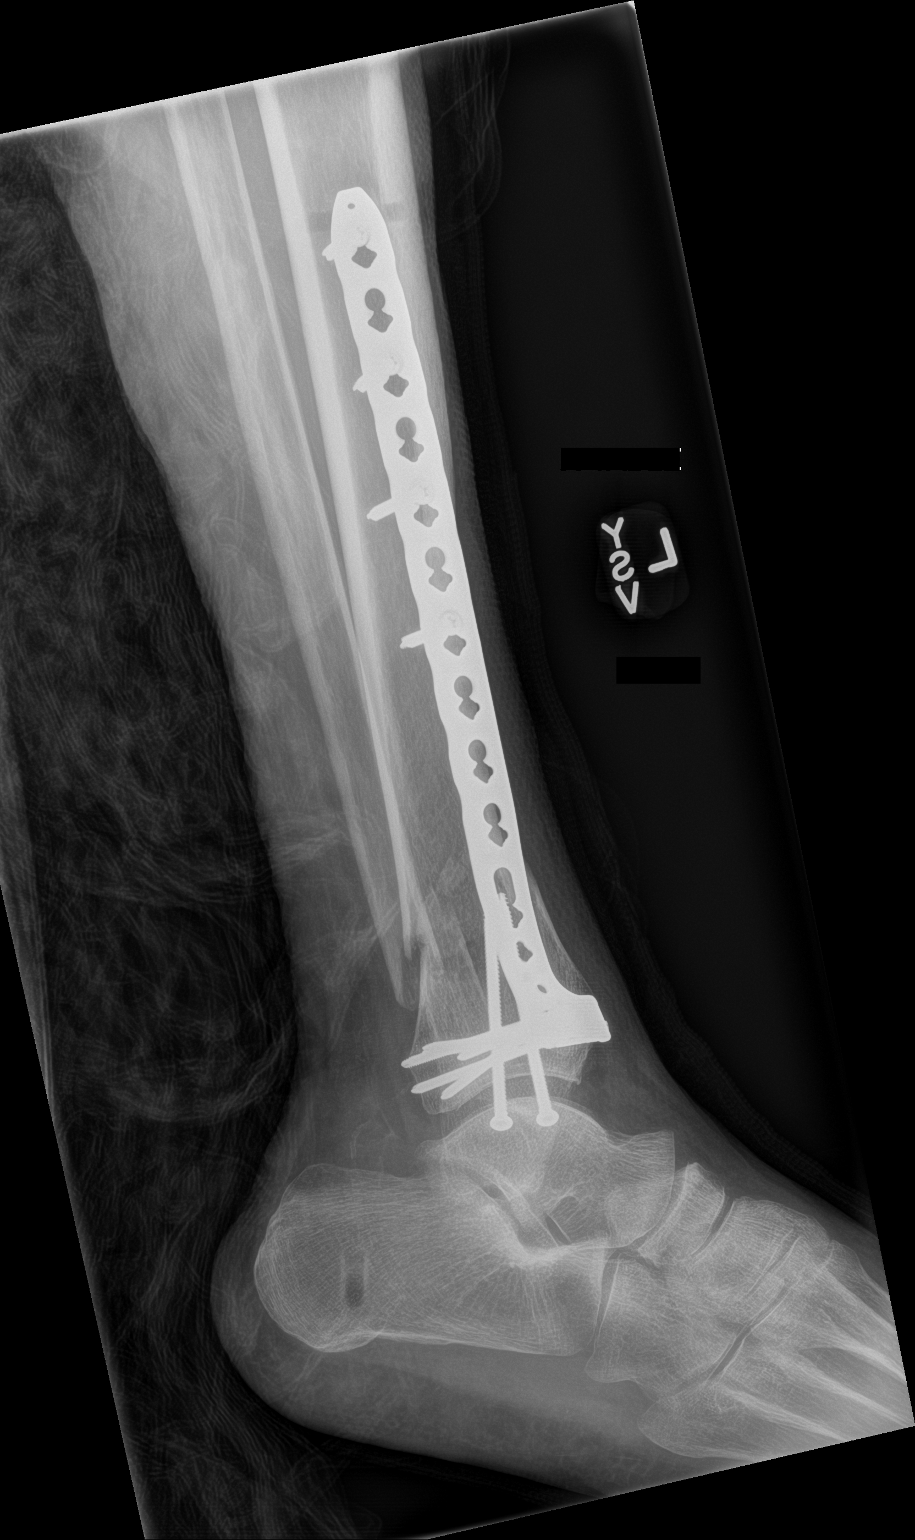

[ankle obl]
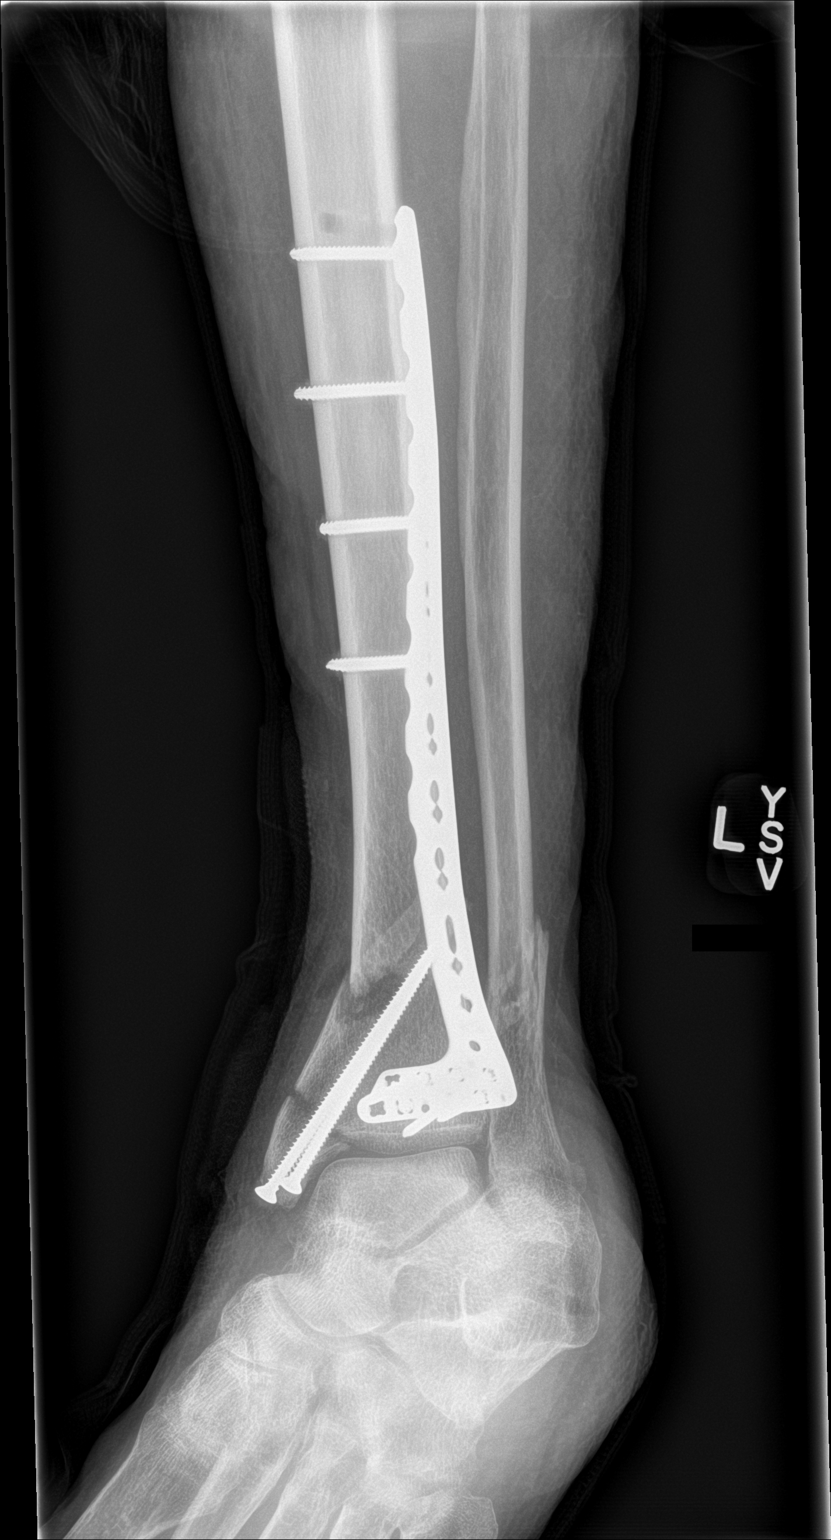

[3 of 3 positions shown; findings below may reference images not displayed]

FINDINGS: Plate and screw fixation of the distal left tibia along with screws
traversing the medial malleolar fracture as on recent intraoperative
assessment with near anatomic alignment about the ankle joint. Talus
is intact. Lucency from previous external fixation is
redemonstrated.
IMPRESSION: Plate and screw fixation of the distal left tibia and screw fixation
medial malleolar fracture with near anatomic alignment about the
ankle joint.

## 2021-09-05 IMAGING — DX DG ANKLE COMPLETE 3+V*R*
3 series · 3 of 3 positions shown · non-contrast
Comparison: Intraoperative assessment of 09/19/2019

CLINICAL DATA: Post bilateral ankle surgery.

EXAM:
RIGHT ANKLE - COMPLETE 3+ VIEW

[ankle ap]
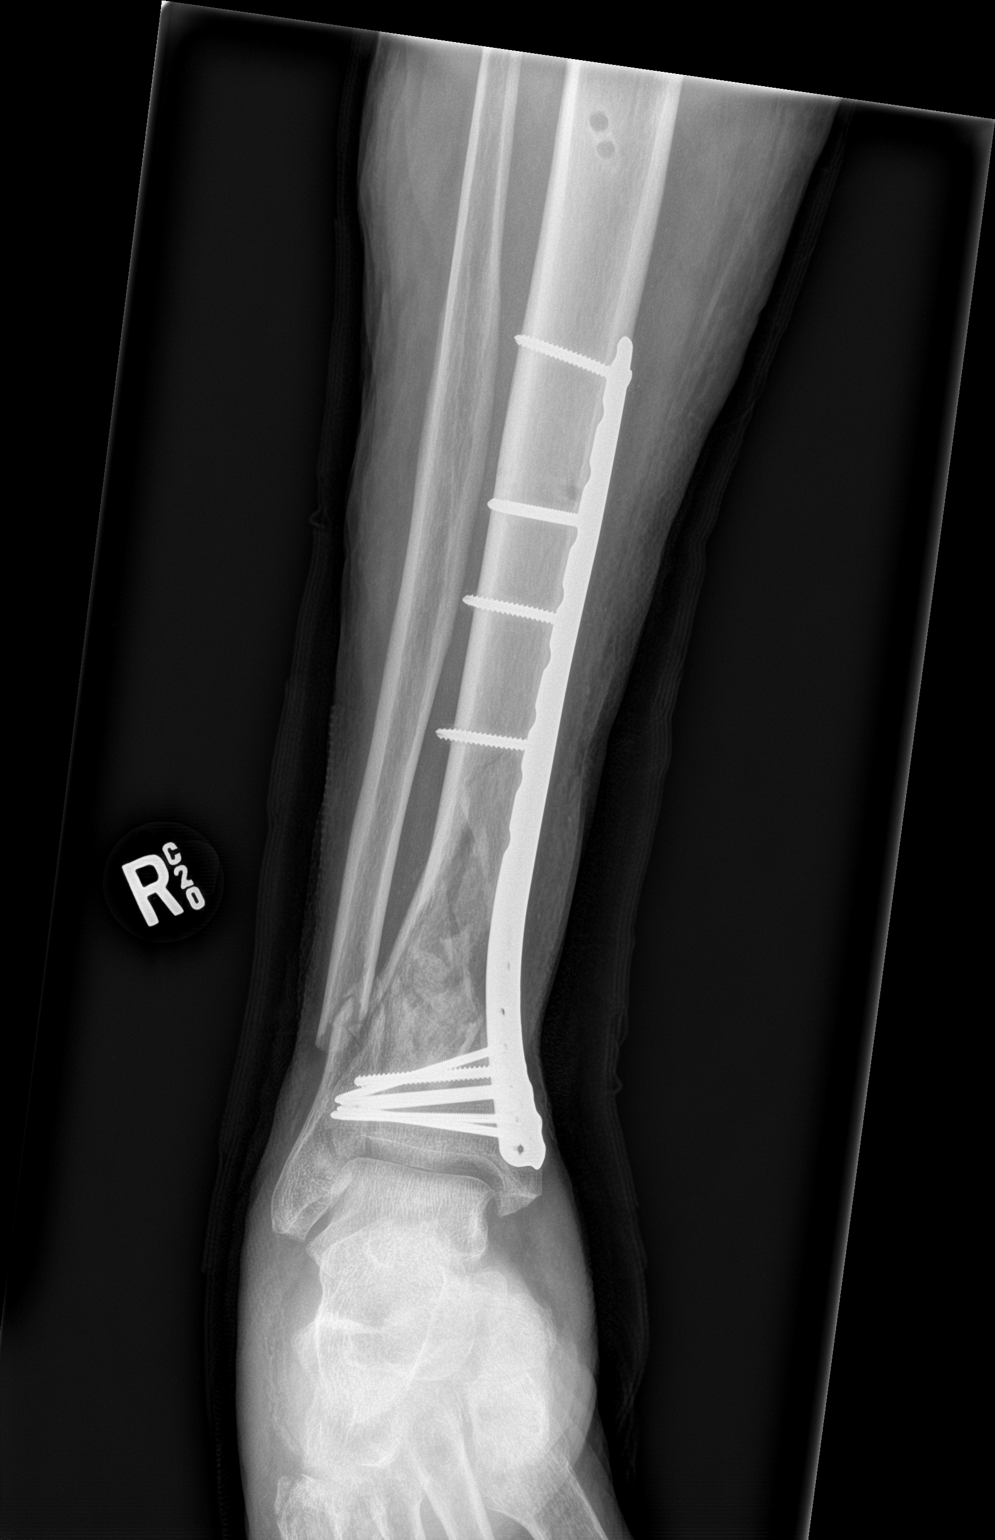

[ankle obl]
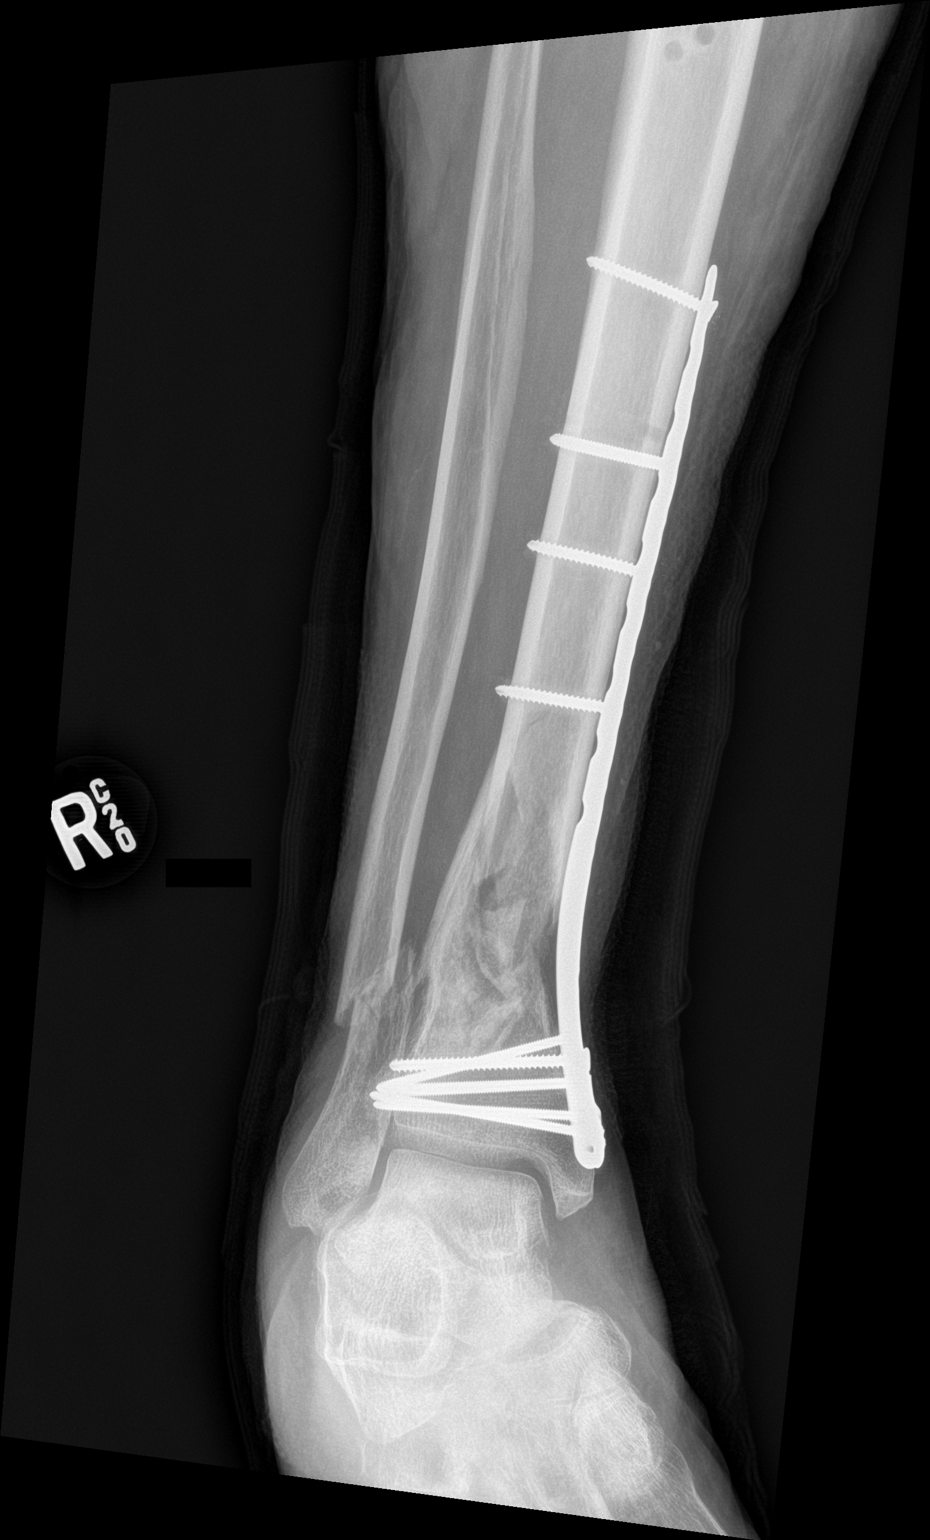

[ankle lat]
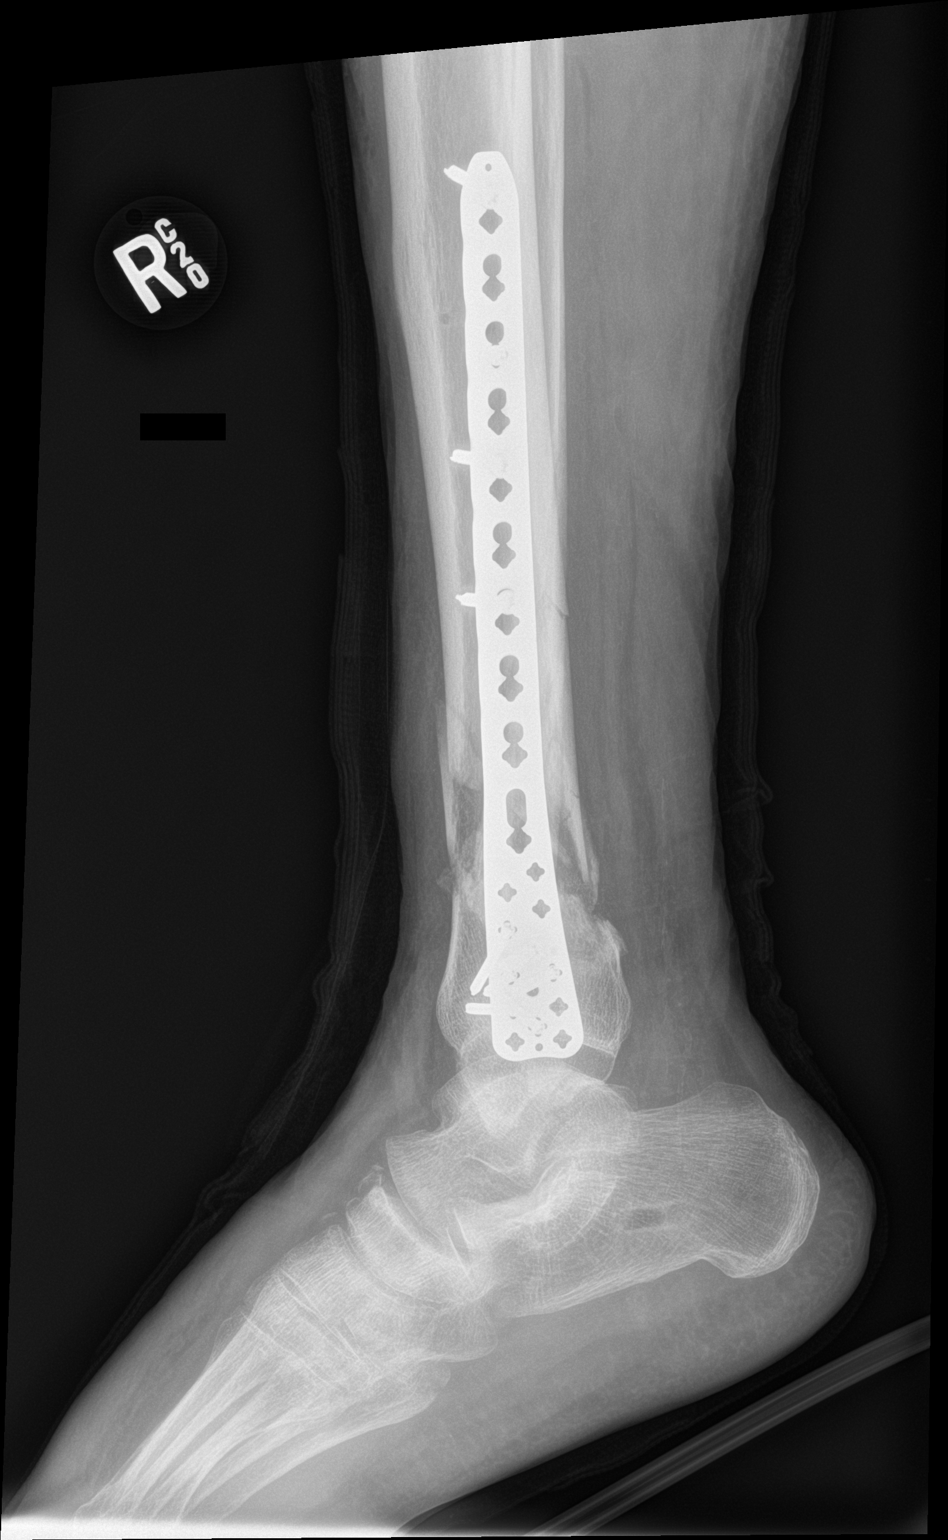

[3 of 3 positions shown; findings below may reference images not displayed]

FINDINGS: Medial cortical plate and screw fixation of comminuted distal tibial
fracture and with improved alignment of fibular fracture is noted.
No signs of immediate complication with near anatomic alignment.
Signs of previous external fixation.
IMPRESSION: Post surgical changes as above with near anatomic alignment and no
signs of complication following ORIF of a comminuted fracture in the
distal right tibia and oblique fracture of the distal right fibula.

## 2021-09-09 IMAGING — DX DG CHEST 1V PORT
1 series · 1 of 1 positions shown · non-contrast
Comparison: 09/23/2019 at 7435 hours

CLINICAL DATA: Trauma, respiratory failure

EXAM:
PORTABLE CHEST 1 VIEW

[chest]
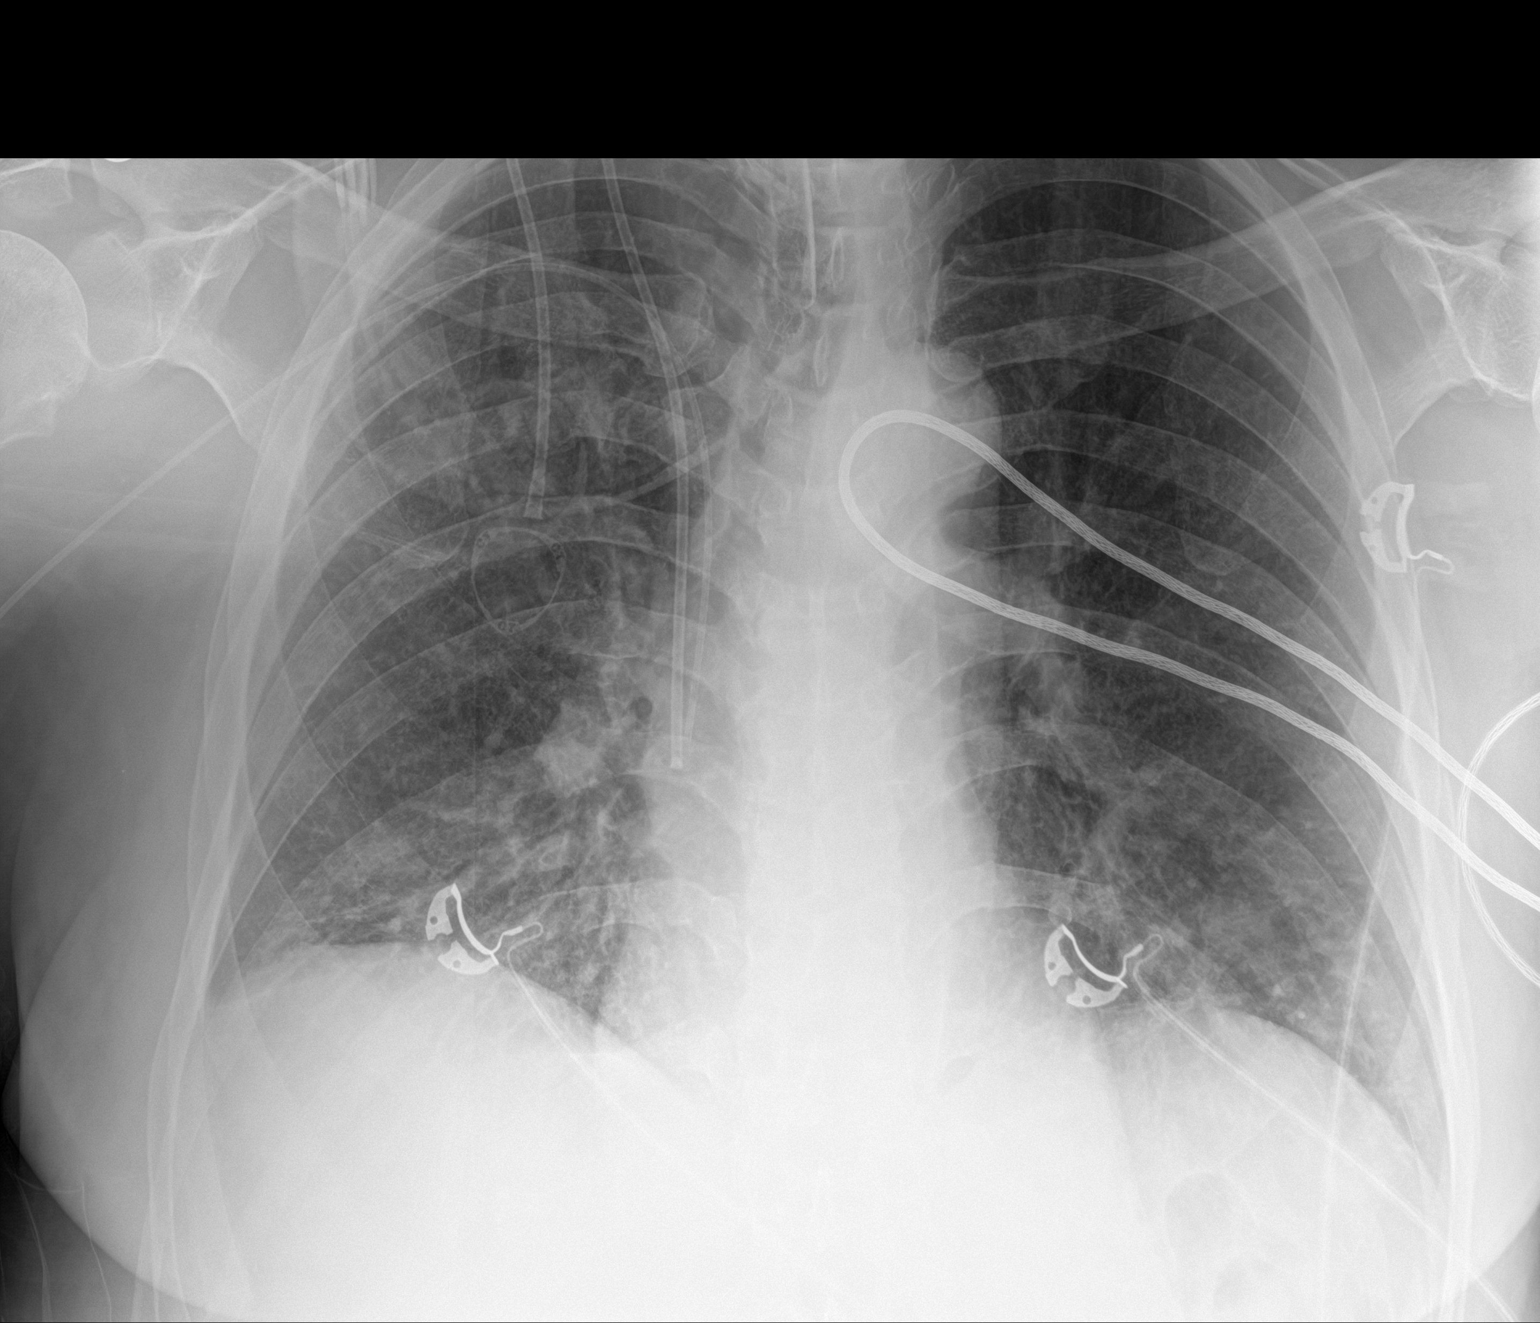

[1 of 1 positions shown; findings below may reference images not displayed]

FINDINGS: Endotracheal tube terminates approximately 6.3 cm superior to the
carina. Right-sided PICC line and Port-A-Cath remain unchanged in
positioning. Stable heart size. Minimal persistent right basilar
opacity, similar to the previous study. Probable trace right pleural
effusion. No pneumothorax.
IMPRESSION: Stable exam without acute cardiopulmonary finding. Satisfactory
positioning of support apparatus.
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
